# Patient Record
Sex: Female | Born: 1970 | Race: Black or African American | Hispanic: No | Smoking: Current every day smoker
Health system: Southern US, Community
[De-identification: ages and names within clinical notes are randomized; demographics above are authoritative.]

## PROBLEM LIST (undated history)

## (undated) DIAGNOSIS — G894 Chronic pain syndrome: Secondary | ICD-10-CM

## (undated) DIAGNOSIS — I890 Lymphedema, not elsewhere classified: Secondary | ICD-10-CM

## (undated) DIAGNOSIS — R159 Full incontinence of feces: Secondary | ICD-10-CM

## (undated) DIAGNOSIS — K219 Gastro-esophageal reflux disease without esophagitis: Secondary | ICD-10-CM

## (undated) DIAGNOSIS — E782 Mixed hyperlipidemia: Secondary | ICD-10-CM

## (undated) DIAGNOSIS — E119 Type 2 diabetes mellitus without complications: Secondary | ICD-10-CM

## (undated) DIAGNOSIS — N3281 Overactive bladder: Secondary | ICD-10-CM

## (undated) DIAGNOSIS — F32A Depression, unspecified: Secondary | ICD-10-CM

## (undated) DIAGNOSIS — G4730 Sleep apnea, unspecified: Secondary | ICD-10-CM

## (undated) DIAGNOSIS — J45909 Unspecified asthma, uncomplicated: Secondary | ICD-10-CM

## (undated) DIAGNOSIS — F411 Generalized anxiety disorder: Secondary | ICD-10-CM

## (undated) DIAGNOSIS — R0683 Snoring: Secondary | ICD-10-CM

## (undated) DIAGNOSIS — J40 Bronchitis, not specified as acute or chronic: Secondary | ICD-10-CM

## (undated) DIAGNOSIS — R0600 Dyspnea, unspecified: Secondary | ICD-10-CM

## (undated) DIAGNOSIS — Z973 Presence of spectacles and contact lenses: Secondary | ICD-10-CM

## (undated) DIAGNOSIS — F419 Anxiety disorder, unspecified: Secondary | ICD-10-CM

## (undated) DIAGNOSIS — Z8709 Personal history of other diseases of the respiratory system: Secondary | ICD-10-CM

## (undated) DIAGNOSIS — R152 Fecal urgency: Secondary | ICD-10-CM

## (undated) DIAGNOSIS — I209 Angina pectoris, unspecified: Secondary | ICD-10-CM

## (undated) DIAGNOSIS — I509 Heart failure, unspecified: Secondary | ICD-10-CM

## (undated) DIAGNOSIS — M5430 Sciatica, unspecified side: Secondary | ICD-10-CM

## (undated) DIAGNOSIS — M1990 Unspecified osteoarthritis, unspecified site: Secondary | ICD-10-CM

## (undated) DIAGNOSIS — Z86718 Personal history of other venous thrombosis and embolism: Secondary | ICD-10-CM

## (undated) DIAGNOSIS — K760 Fatty (change of) liver, not elsewhere classified: Secondary | ICD-10-CM

## (undated) DIAGNOSIS — J449 Chronic obstructive pulmonary disease, unspecified: Secondary | ICD-10-CM

## (undated) DIAGNOSIS — F209 Schizophrenia, unspecified: Secondary | ICD-10-CM

## (undated) DIAGNOSIS — G629 Polyneuropathy, unspecified: Secondary | ICD-10-CM

## (undated) DIAGNOSIS — F319 Bipolar disorder, unspecified: Secondary | ICD-10-CM

## (undated) DIAGNOSIS — R0602 Shortness of breath: Secondary | ICD-10-CM

## (undated) DIAGNOSIS — J189 Pneumonia, unspecified organism: Secondary | ICD-10-CM

## (undated) DIAGNOSIS — F329 Major depressive disorder, single episode, unspecified: Secondary | ICD-10-CM

## (undated) DIAGNOSIS — N644 Mastodynia: Secondary | ICD-10-CM

## (undated) DIAGNOSIS — K469 Unspecified abdominal hernia without obstruction or gangrene: Secondary | ICD-10-CM

## (undated) DIAGNOSIS — I10 Essential (primary) hypertension: Secondary | ICD-10-CM

## (undated) DIAGNOSIS — Z86711 Personal history of pulmonary embolism: Secondary | ICD-10-CM

## (undated) DIAGNOSIS — G4733 Obstructive sleep apnea (adult) (pediatric): Secondary | ICD-10-CM

## (undated) DIAGNOSIS — R011 Cardiac murmur, unspecified: Secondary | ICD-10-CM

## (undated) DIAGNOSIS — R519 Headache, unspecified: Secondary | ICD-10-CM

## (undated) DIAGNOSIS — F1911 Other psychoactive substance abuse, in remission: Secondary | ICD-10-CM

## (undated) HISTORY — PX: OTHER SURGICAL HISTORY: SHX169

## (undated) HISTORY — PX: ORIF ORBITAL FRACTURE: SHX5312

## (undated) HISTORY — PX: CHOLECYSTECTOMY: SHX55

## (undated) HISTORY — PX: ROTATOR CUFF REPAIR: SHX139

## (undated) HISTORY — PX: BACK SURGERY: SHX140

## (undated) HISTORY — PX: TUBAL LIGATION: SHX77

## (undated) HISTORY — PX: EYE SURGERY: SHX253

## (undated) HISTORY — PX: KNEE ARTHROSCOPY: SUR90

## (undated) HISTORY — DX: Lymphedema, not elsewhere classified: I89.0

---
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
## 1997-09-14 HISTORY — PX: TUBAL LIGATION: SHX77

## 1997-12-23 ENCOUNTER — Inpatient Hospital Stay (HOSPITAL_COMMUNITY): Admission: AD | Admit: 1997-12-23 | Discharge: 1997-12-23 | Payer: Self-pay | Admitting: Obstetrics and Gynecology

## 1998-01-20 ENCOUNTER — Inpatient Hospital Stay (HOSPITAL_COMMUNITY): Admission: AD | Admit: 1998-01-20 | Discharge: 1998-01-20 | Payer: Self-pay | Admitting: Obstetrics and Gynecology

## 1998-02-28 ENCOUNTER — Inpatient Hospital Stay (HOSPITAL_COMMUNITY): Admission: AD | Admit: 1998-02-28 | Discharge: 1998-02-28 | Payer: Self-pay | Admitting: Obstetrics & Gynecology

## 1998-03-09 ENCOUNTER — Inpatient Hospital Stay (HOSPITAL_COMMUNITY): Admission: AD | Admit: 1998-03-09 | Discharge: 1998-03-09 | Payer: Self-pay | Admitting: Obstetrics & Gynecology

## 1998-04-14 ENCOUNTER — Inpatient Hospital Stay (HOSPITAL_COMMUNITY): Admission: AD | Admit: 1998-04-14 | Discharge: 1998-04-14 | Payer: Self-pay | Admitting: Obstetrics & Gynecology

## 1998-04-25 ENCOUNTER — Encounter: Admission: RE | Admit: 1998-04-25 | Discharge: 1998-04-25 | Payer: Self-pay | Admitting: Family Medicine

## 1998-05-01 ENCOUNTER — Encounter: Admission: RE | Admit: 1998-05-01 | Discharge: 1998-05-01 | Payer: Self-pay | Admitting: Family Medicine

## 1998-05-14 ENCOUNTER — Emergency Department (HOSPITAL_COMMUNITY): Admission: EM | Admit: 1998-05-14 | Discharge: 1998-05-14 | Payer: Self-pay | Admitting: Emergency Medicine

## 1998-06-05 ENCOUNTER — Inpatient Hospital Stay (HOSPITAL_COMMUNITY): Admission: AD | Admit: 1998-06-05 | Discharge: 1998-06-05 | Payer: Self-pay | Admitting: Obstetrics & Gynecology

## 1998-06-10 ENCOUNTER — Inpatient Hospital Stay (HOSPITAL_COMMUNITY): Admission: AD | Admit: 1998-06-10 | Discharge: 1998-06-12 | Payer: Self-pay

## 1998-09-11 ENCOUNTER — Encounter: Admission: RE | Admit: 1998-09-11 | Discharge: 1998-09-11 | Payer: Self-pay | Admitting: Sports Medicine

## 1998-10-15 ENCOUNTER — Encounter: Admission: RE | Admit: 1998-10-15 | Discharge: 1998-10-15 | Payer: Self-pay | Admitting: Family Medicine

## 1998-11-22 ENCOUNTER — Encounter: Admission: RE | Admit: 1998-11-22 | Discharge: 1998-11-22 | Payer: Self-pay | Admitting: Family Medicine

## 1998-12-11 ENCOUNTER — Encounter: Admission: RE | Admit: 1998-12-11 | Discharge: 1998-12-11 | Payer: Self-pay | Admitting: Family Medicine

## 1998-12-11 ENCOUNTER — Other Ambulatory Visit: Admission: RE | Admit: 1998-12-11 | Discharge: 1998-12-11 | Payer: Self-pay

## 1999-01-01 ENCOUNTER — Encounter: Admission: RE | Admit: 1999-01-01 | Discharge: 1999-01-01 | Payer: Self-pay | Admitting: Family Medicine

## 1999-01-15 ENCOUNTER — Encounter: Admission: RE | Admit: 1999-01-15 | Discharge: 1999-01-15 | Payer: Self-pay | Admitting: Family Medicine

## 1999-02-11 ENCOUNTER — Encounter: Admission: RE | Admit: 1999-02-11 | Discharge: 1999-02-11 | Payer: Self-pay | Admitting: Family Medicine

## 1999-03-04 ENCOUNTER — Encounter: Admission: RE | Admit: 1999-03-04 | Discharge: 1999-03-04 | Payer: Self-pay | Admitting: Family Medicine

## 1999-03-20 ENCOUNTER — Encounter: Admission: RE | Admit: 1999-03-20 | Discharge: 1999-03-20 | Payer: Self-pay | Admitting: Family Medicine

## 1999-05-09 ENCOUNTER — Encounter: Admission: RE | Admit: 1999-05-09 | Discharge: 1999-05-09 | Payer: Self-pay | Admitting: Family Medicine

## 1999-07-02 ENCOUNTER — Emergency Department (HOSPITAL_COMMUNITY): Admission: EM | Admit: 1999-07-02 | Discharge: 1999-07-02 | Payer: Self-pay | Admitting: Emergency Medicine

## 1999-07-10 ENCOUNTER — Encounter: Admission: RE | Admit: 1999-07-10 | Discharge: 1999-07-10 | Payer: Self-pay | Admitting: Family Medicine

## 1999-07-10 ENCOUNTER — Emergency Department (HOSPITAL_COMMUNITY): Admission: EM | Admit: 1999-07-10 | Discharge: 1999-07-10 | Payer: Self-pay | Admitting: Emergency Medicine

## 1999-08-28 ENCOUNTER — Encounter: Admission: RE | Admit: 1999-08-28 | Discharge: 1999-08-28 | Payer: Self-pay | Admitting: Family Medicine

## 1999-09-17 ENCOUNTER — Encounter: Admission: RE | Admit: 1999-09-17 | Discharge: 1999-09-17 | Payer: Self-pay | Admitting: Family Medicine

## 1999-10-01 ENCOUNTER — Encounter: Admission: RE | Admit: 1999-10-01 | Discharge: 1999-10-01 | Payer: Self-pay | Admitting: Family Medicine

## 1999-10-07 ENCOUNTER — Encounter: Admission: RE | Admit: 1999-10-07 | Discharge: 1999-10-07 | Payer: Self-pay | Admitting: Family Medicine

## 1999-10-28 ENCOUNTER — Encounter: Admission: RE | Admit: 1999-10-28 | Discharge: 1999-10-28 | Payer: Self-pay | Admitting: Sports Medicine

## 2000-01-27 ENCOUNTER — Other Ambulatory Visit: Admission: RE | Admit: 2000-01-27 | Discharge: 2000-01-27 | Payer: Self-pay | Admitting: Obstetrics & Gynecology

## 2000-01-27 ENCOUNTER — Encounter: Admission: RE | Admit: 2000-01-27 | Discharge: 2000-01-27 | Payer: Self-pay | Admitting: Family Medicine

## 2000-02-12 ENCOUNTER — Emergency Department (HOSPITAL_COMMUNITY): Admission: EM | Admit: 2000-02-12 | Discharge: 2000-02-12 | Payer: Self-pay | Admitting: Emergency Medicine

## 2000-02-12 ENCOUNTER — Encounter: Payer: Self-pay | Admitting: Emergency Medicine

## 2000-06-15 ENCOUNTER — Encounter: Admission: RE | Admit: 2000-06-15 | Discharge: 2000-06-15 | Payer: Self-pay | Admitting: Family Medicine

## 2000-07-04 ENCOUNTER — Inpatient Hospital Stay (HOSPITAL_COMMUNITY): Admission: EM | Admit: 2000-07-04 | Discharge: 2000-07-05 | Payer: Self-pay | Admitting: Emergency Medicine

## 2000-07-04 ENCOUNTER — Encounter: Payer: Self-pay | Admitting: Emergency Medicine

## 2000-08-22 ENCOUNTER — Emergency Department (HOSPITAL_COMMUNITY): Admission: EM | Admit: 2000-08-22 | Discharge: 2000-08-22 | Payer: Self-pay | Admitting: Emergency Medicine

## 2000-09-06 ENCOUNTER — Emergency Department (HOSPITAL_COMMUNITY): Admission: EM | Admit: 2000-09-06 | Discharge: 2000-09-06 | Payer: Self-pay | Admitting: Emergency Medicine

## 2000-09-20 ENCOUNTER — Encounter: Admission: RE | Admit: 2000-09-20 | Discharge: 2000-09-20 | Payer: Self-pay | Admitting: Family Medicine

## 2000-09-20 ENCOUNTER — Encounter: Admission: RE | Admit: 2000-09-20 | Discharge: 2000-09-20 | Payer: Self-pay | Admitting: *Deleted

## 2000-09-20 ENCOUNTER — Encounter: Payer: Self-pay | Admitting: *Deleted

## 2000-10-04 ENCOUNTER — Encounter: Admission: RE | Admit: 2000-10-04 | Discharge: 2000-10-04 | Payer: Self-pay | Admitting: Family Medicine

## 2000-10-28 ENCOUNTER — Encounter: Admission: RE | Admit: 2000-10-28 | Discharge: 2000-10-28 | Payer: Self-pay | Admitting: Family Medicine

## 2000-11-21 ENCOUNTER — Encounter: Payer: Self-pay | Admitting: Emergency Medicine

## 2000-11-21 ENCOUNTER — Emergency Department (HOSPITAL_COMMUNITY): Admission: EM | Admit: 2000-11-21 | Discharge: 2000-11-21 | Payer: Self-pay | Admitting: Emergency Medicine

## 2001-01-28 ENCOUNTER — Encounter: Admission: RE | Admit: 2001-01-28 | Discharge: 2001-01-28 | Payer: Self-pay | Admitting: Family Medicine

## 2001-05-20 ENCOUNTER — Encounter: Admission: RE | Admit: 2001-05-20 | Discharge: 2001-05-20 | Payer: Self-pay | Admitting: Family Medicine

## 2001-06-03 ENCOUNTER — Encounter: Admission: RE | Admit: 2001-06-03 | Discharge: 2001-06-03 | Payer: Self-pay | Admitting: Family Medicine

## 2001-07-20 ENCOUNTER — Other Ambulatory Visit: Admission: RE | Admit: 2001-07-20 | Discharge: 2001-07-20 | Payer: Self-pay | Admitting: Family Medicine

## 2001-07-20 ENCOUNTER — Encounter: Admission: RE | Admit: 2001-07-20 | Discharge: 2001-07-20 | Payer: Self-pay | Admitting: Family Medicine

## 2001-09-03 ENCOUNTER — Emergency Department (HOSPITAL_COMMUNITY): Admission: EM | Admit: 2001-09-03 | Discharge: 2001-09-03 | Payer: Self-pay | Admitting: Emergency Medicine

## 2001-09-16 ENCOUNTER — Ambulatory Visit (HOSPITAL_COMMUNITY): Admission: RE | Admit: 2001-09-16 | Discharge: 2001-09-16 | Payer: Self-pay | Admitting: Family Medicine

## 2001-09-16 ENCOUNTER — Encounter: Admission: RE | Admit: 2001-09-16 | Discharge: 2001-09-16 | Payer: Self-pay | Admitting: Family Medicine

## 2001-10-17 ENCOUNTER — Encounter: Admission: RE | Admit: 2001-10-17 | Discharge: 2001-10-17 | Payer: Self-pay | Admitting: Family Medicine

## 2002-01-03 ENCOUNTER — Emergency Department (HOSPITAL_COMMUNITY): Admission: EM | Admit: 2002-01-03 | Discharge: 2002-01-03 | Payer: Self-pay | Admitting: Emergency Medicine

## 2002-02-24 ENCOUNTER — Encounter: Admission: RE | Admit: 2002-02-24 | Discharge: 2002-02-24 | Payer: Self-pay | Admitting: Family Medicine

## 2002-02-27 ENCOUNTER — Encounter: Admission: RE | Admit: 2002-02-27 | Discharge: 2002-02-27 | Payer: Self-pay | Admitting: Family Medicine

## 2002-04-12 ENCOUNTER — Encounter: Admission: RE | Admit: 2002-04-12 | Discharge: 2002-04-12 | Payer: Self-pay | Admitting: Family Medicine

## 2002-04-29 ENCOUNTER — Inpatient Hospital Stay (HOSPITAL_COMMUNITY): Admission: EM | Admit: 2002-04-29 | Discharge: 2002-05-01 | Payer: Self-pay | Admitting: Psychiatry

## 2002-05-26 ENCOUNTER — Encounter: Admission: RE | Admit: 2002-05-26 | Discharge: 2002-05-26 | Payer: Self-pay | Admitting: Family Medicine

## 2002-06-22 ENCOUNTER — Emergency Department (HOSPITAL_COMMUNITY): Admission: EM | Admit: 2002-06-22 | Discharge: 2002-06-22 | Payer: Self-pay | Admitting: Emergency Medicine

## 2002-09-18 ENCOUNTER — Emergency Department (HOSPITAL_COMMUNITY): Admission: EM | Admit: 2002-09-18 | Discharge: 2002-09-18 | Payer: Self-pay | Admitting: Emergency Medicine

## 2002-09-18 ENCOUNTER — Encounter: Payer: Self-pay | Admitting: Emergency Medicine

## 2002-09-21 ENCOUNTER — Encounter: Admission: RE | Admit: 2002-09-21 | Discharge: 2002-09-21 | Payer: Self-pay | Admitting: Sports Medicine

## 2002-09-21 ENCOUNTER — Encounter: Payer: Self-pay | Admitting: Sports Medicine

## 2002-10-03 ENCOUNTER — Encounter: Admission: RE | Admit: 2002-10-03 | Discharge: 2002-10-03 | Payer: Self-pay | Admitting: Sports Medicine

## 2002-10-31 ENCOUNTER — Other Ambulatory Visit: Admission: RE | Admit: 2002-10-31 | Discharge: 2002-10-31 | Payer: Self-pay | Admitting: Family Medicine

## 2002-10-31 ENCOUNTER — Encounter: Admission: RE | Admit: 2002-10-31 | Discharge: 2002-10-31 | Payer: Self-pay | Admitting: Sports Medicine

## 2002-11-26 ENCOUNTER — Encounter: Payer: Self-pay | Admitting: Emergency Medicine

## 2002-11-26 ENCOUNTER — Emergency Department (HOSPITAL_COMMUNITY): Admission: EM | Admit: 2002-11-26 | Discharge: 2002-11-26 | Payer: Self-pay | Admitting: Emergency Medicine

## 2002-12-07 ENCOUNTER — Encounter: Admission: RE | Admit: 2002-12-07 | Discharge: 2002-12-07 | Payer: Self-pay | Admitting: Family Medicine

## 2003-01-10 ENCOUNTER — Encounter: Admission: RE | Admit: 2003-01-10 | Discharge: 2003-01-10 | Payer: Self-pay | Admitting: Family Medicine

## 2003-01-29 ENCOUNTER — Emergency Department (HOSPITAL_COMMUNITY): Admission: EM | Admit: 2003-01-29 | Discharge: 2003-01-29 | Payer: Self-pay | Admitting: Emergency Medicine

## 2003-02-15 ENCOUNTER — Encounter: Admission: RE | Admit: 2003-02-15 | Discharge: 2003-02-15 | Payer: Self-pay | Admitting: Sports Medicine

## 2003-03-13 ENCOUNTER — Inpatient Hospital Stay (HOSPITAL_COMMUNITY): Admission: AD | Admit: 2003-03-13 | Discharge: 2003-03-13 | Payer: Self-pay | Admitting: *Deleted

## 2003-03-13 ENCOUNTER — Encounter: Payer: Self-pay | Admitting: *Deleted

## 2003-03-14 ENCOUNTER — Encounter: Admission: RE | Admit: 2003-03-14 | Discharge: 2003-03-14 | Payer: Self-pay | Admitting: Family Medicine

## 2003-04-11 ENCOUNTER — Encounter: Admission: RE | Admit: 2003-04-11 | Discharge: 2003-04-11 | Payer: Self-pay | Admitting: Family Medicine

## 2003-05-27 ENCOUNTER — Encounter: Payer: Self-pay | Admitting: Emergency Medicine

## 2003-05-27 ENCOUNTER — Emergency Department (HOSPITAL_COMMUNITY): Admission: EM | Admit: 2003-05-27 | Discharge: 2003-05-27 | Payer: Self-pay | Admitting: Emergency Medicine

## 2003-05-28 ENCOUNTER — Encounter: Admission: RE | Admit: 2003-05-28 | Discharge: 2003-05-28 | Payer: Self-pay | Admitting: Family Medicine

## 2003-06-17 ENCOUNTER — Encounter: Payer: Self-pay | Admitting: Orthopedic Surgery

## 2003-06-17 ENCOUNTER — Ambulatory Visit (HOSPITAL_COMMUNITY): Admission: RE | Admit: 2003-06-17 | Discharge: 2003-06-17 | Payer: Self-pay | Admitting: Orthopedic Surgery

## 2003-06-25 ENCOUNTER — Encounter: Admission: RE | Admit: 2003-06-25 | Discharge: 2003-07-02 | Payer: Self-pay | Admitting: Orthopedic Surgery

## 2003-11-03 ENCOUNTER — Emergency Department (HOSPITAL_COMMUNITY): Admission: EM | Admit: 2003-11-03 | Discharge: 2003-11-03 | Payer: Self-pay | Admitting: Emergency Medicine

## 2003-11-20 ENCOUNTER — Encounter: Admission: RE | Admit: 2003-11-20 | Discharge: 2003-11-20 | Payer: Self-pay | Admitting: Family Medicine

## 2004-01-21 ENCOUNTER — Encounter: Admission: RE | Admit: 2004-01-21 | Discharge: 2004-01-21 | Payer: Self-pay | Admitting: Family Medicine

## 2004-01-21 ENCOUNTER — Other Ambulatory Visit: Admission: RE | Admit: 2004-01-21 | Discharge: 2004-01-21 | Payer: Self-pay | Admitting: Family Medicine

## 2004-01-28 ENCOUNTER — Encounter: Admission: RE | Admit: 2004-01-28 | Discharge: 2004-01-28 | Payer: Self-pay | Admitting: Sports Medicine

## 2004-01-28 ENCOUNTER — Encounter: Admission: RE | Admit: 2004-01-28 | Discharge: 2004-01-28 | Payer: Self-pay | Admitting: Family Medicine

## 2004-02-20 ENCOUNTER — Emergency Department (HOSPITAL_COMMUNITY): Admission: EM | Admit: 2004-02-20 | Discharge: 2004-02-20 | Payer: Self-pay | Admitting: Emergency Medicine

## 2004-06-12 ENCOUNTER — Emergency Department (HOSPITAL_COMMUNITY): Admission: EM | Admit: 2004-06-12 | Discharge: 2004-06-12 | Payer: Self-pay | Admitting: Emergency Medicine

## 2004-09-18 ENCOUNTER — Emergency Department (HOSPITAL_COMMUNITY): Admission: EM | Admit: 2004-09-18 | Discharge: 2004-09-18 | Payer: Self-pay | Admitting: Emergency Medicine

## 2004-12-15 ENCOUNTER — Emergency Department (HOSPITAL_COMMUNITY): Admission: EM | Admit: 2004-12-15 | Discharge: 2004-12-15 | Payer: Self-pay | Admitting: Emergency Medicine

## 2005-03-11 ENCOUNTER — Emergency Department (HOSPITAL_COMMUNITY): Admission: EM | Admit: 2005-03-11 | Discharge: 2005-03-11 | Payer: Self-pay | Admitting: Emergency Medicine

## 2005-03-17 ENCOUNTER — Emergency Department (HOSPITAL_COMMUNITY): Admission: EM | Admit: 2005-03-17 | Discharge: 2005-03-18 | Payer: Self-pay | Admitting: Emergency Medicine

## 2005-05-12 ENCOUNTER — Emergency Department (HOSPITAL_COMMUNITY): Admission: EM | Admit: 2005-05-12 | Discharge: 2005-05-12 | Payer: Self-pay | Admitting: Emergency Medicine

## 2005-08-04 ENCOUNTER — Emergency Department (HOSPITAL_COMMUNITY): Admission: EM | Admit: 2005-08-04 | Discharge: 2005-08-04 | Payer: Self-pay | Admitting: Emergency Medicine

## 2005-08-16 ENCOUNTER — Emergency Department (HOSPITAL_COMMUNITY): Admission: EM | Admit: 2005-08-16 | Discharge: 2005-08-16 | Payer: Self-pay | Admitting: Emergency Medicine

## 2005-08-27 ENCOUNTER — Emergency Department (HOSPITAL_COMMUNITY): Admission: EM | Admit: 2005-08-27 | Discharge: 2005-08-27 | Payer: Self-pay | Admitting: Emergency Medicine

## 2005-12-07 IMAGING — CR DG ABDOMEN ACUTE W/ 1V CHEST
3 series · 3 of 3 positions shown · non-contrast
Comparison: None.

CLINICAL DATA: Abdominal pain, nausea; cough, shortness of breath.
 ACUTE ABDOMEN SERIES (AP AND ERECT ABDOMEN AND PA CHEST) 11/03/03

[view not recorded (1 of 3)]
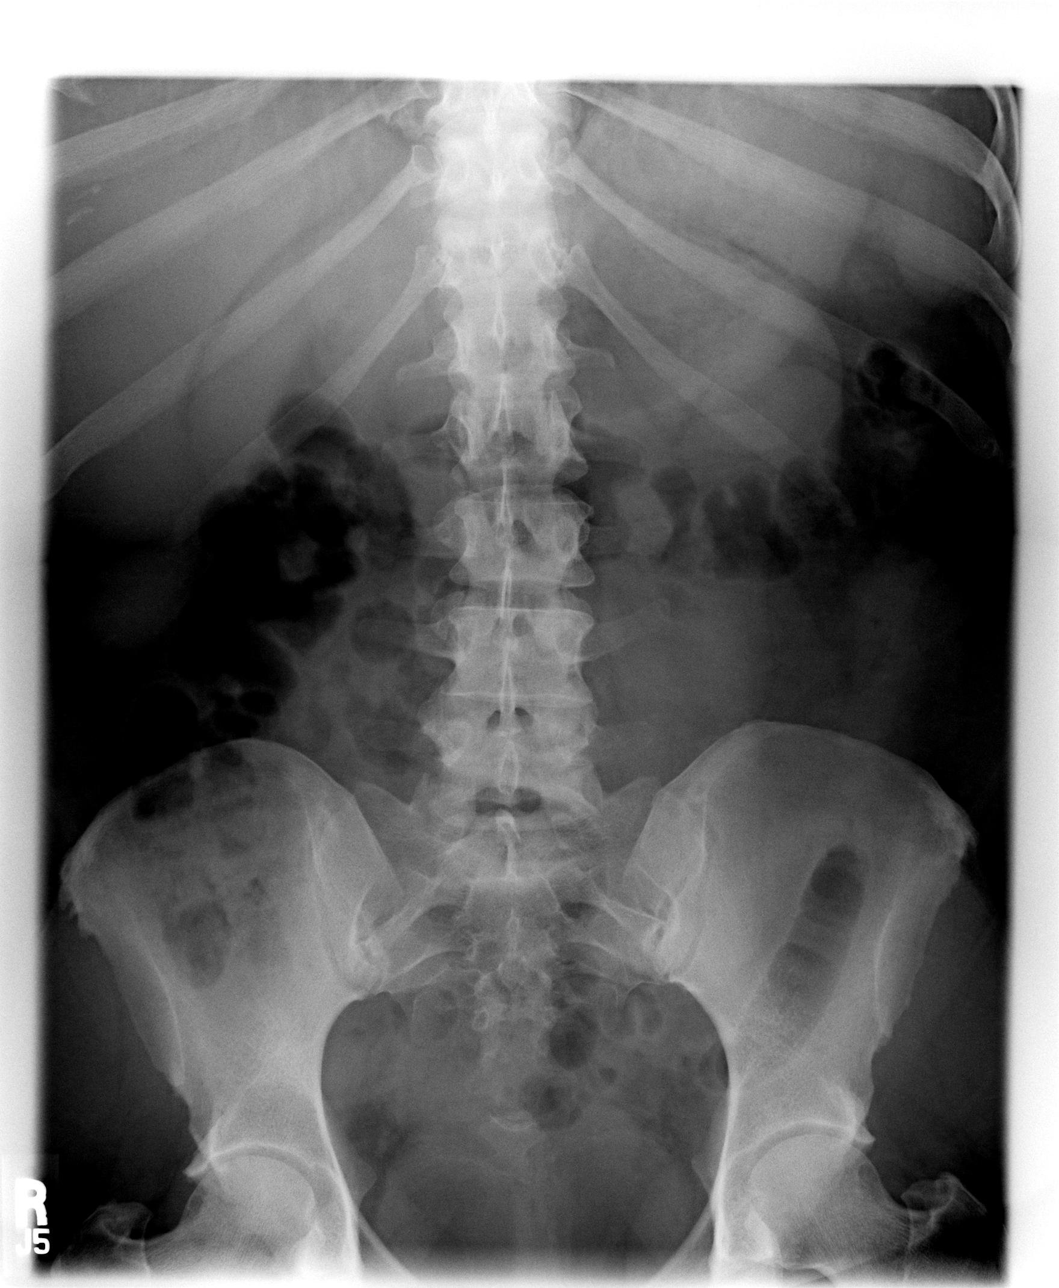

[view not recorded (2 of 3)]
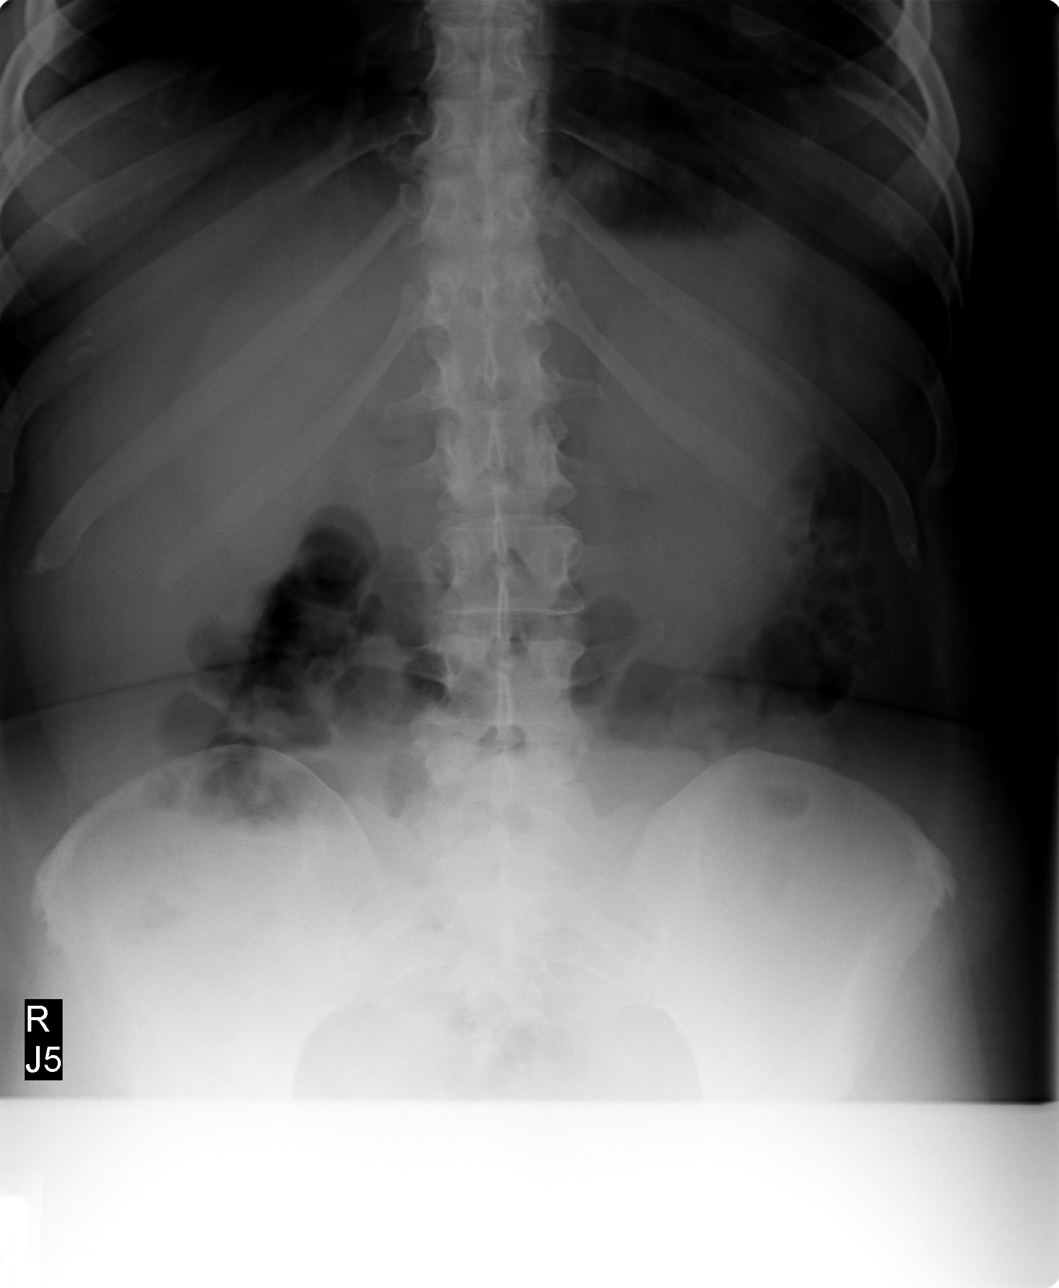

[view not recorded (3 of 3)]
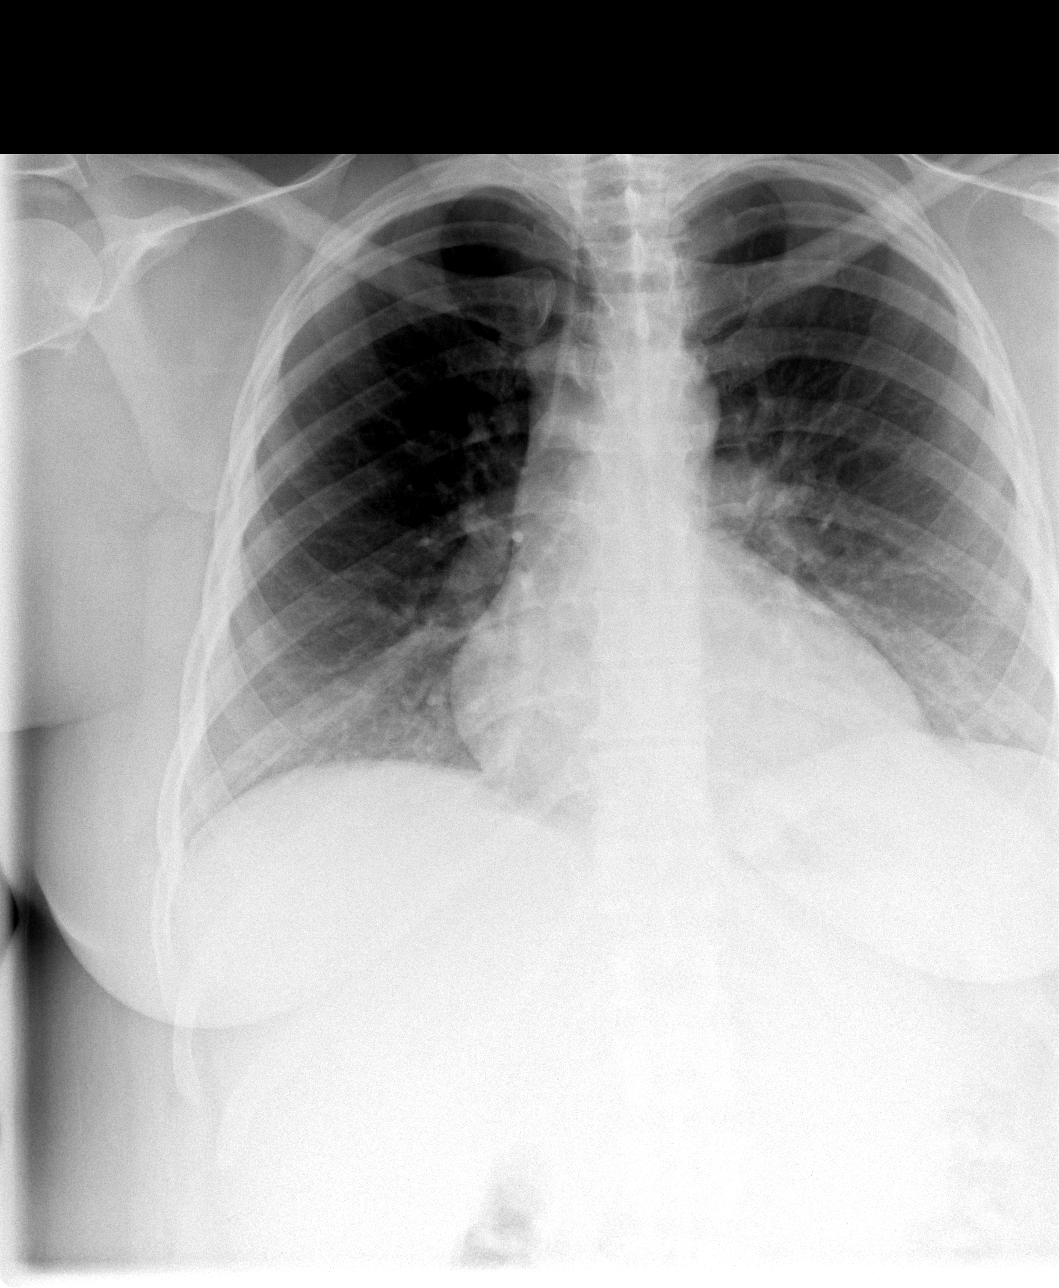

[3 of 3 positions shown; findings below may reference images not displayed]

The bowel gas pattern is unremarkable, and there is no evidence of obstruction or free intraperitoneal air.  No abnormal calcifications are identified.  "Tug" lesions are noted arising from the anterior superior iliac spines bilaterally.  The skeleton is otherwise unremarkable.  
 The accompany PA chest demonstrates a normal cardiomediastinal silhouette.  The lungs appear clear. 
 IMPRESSION
 No acute abdominal or pulmonary abnormalities.

## 2006-03-03 IMAGING — CR DG SHOULDER 2+V*L*
3 series · 3 of 3 positions shown · non-contrast
Comparison: none

CLINICAL DATA: Bilateral shoulder, low back, and bilateral sacroiliac joint pain.
SACROILIAC JOINTS ? 01/28/04 
Three views of the sacroiliac joints demonstrate normal appearing sacroiliac joints.  Hyperostosis arising from the lateral aspects of both iliac wings as well as minimal left and moderate right greater trochanteric spur formation or hyperostosis.
IMPRESSION
1.   Normal sacroiliac joints.
2.  Bilateral iliac wing and greater trochanter hyperostosis.  This can be seen with diffuse idiopathic skeletal hyperostosis (DISH). 
COMPLETE LUMBAR SPINE ? 01/28/04 
Five views demonstrate five non rib bearing lumbar vertebrae and minimal levoconvex thoracolumbar rotary scoliosis.  Mild anterior spur formation at the L2-3 through L4-5 levels.  Moderate posterior spur formation at the L5-S1 level.  Facet degenerative changes in the mid and lower lumbar spine.  No pars defects or subluxations. 
Degenerative changes, as described above.
COMPLETE LEFT SHOULDER ? 01/28/04 
Three views of the left shoulder demonstrate mild superior and inferior distal clavicular spur formation.  
Mild clavicular spur formation. 
COMPLETE RIGHT SHOULDER ? 01/28/04 
Three views of the right shoulder demonstrate a small oval calcific density projected at the inferior aspect of the glenohumeral joint on the internal rotation view.  This is projected over the humeral neck on the lateral rotation view.  Also noted is mild to moderate hyperostosis arising from the proximal aspect of the greater tuberosity.  
1.  Small loose body or accessory ossicle, as described above. 
2.  Mild to moderate greater tuberosity hyperostosis.

[view not recorded (1 of 3)]
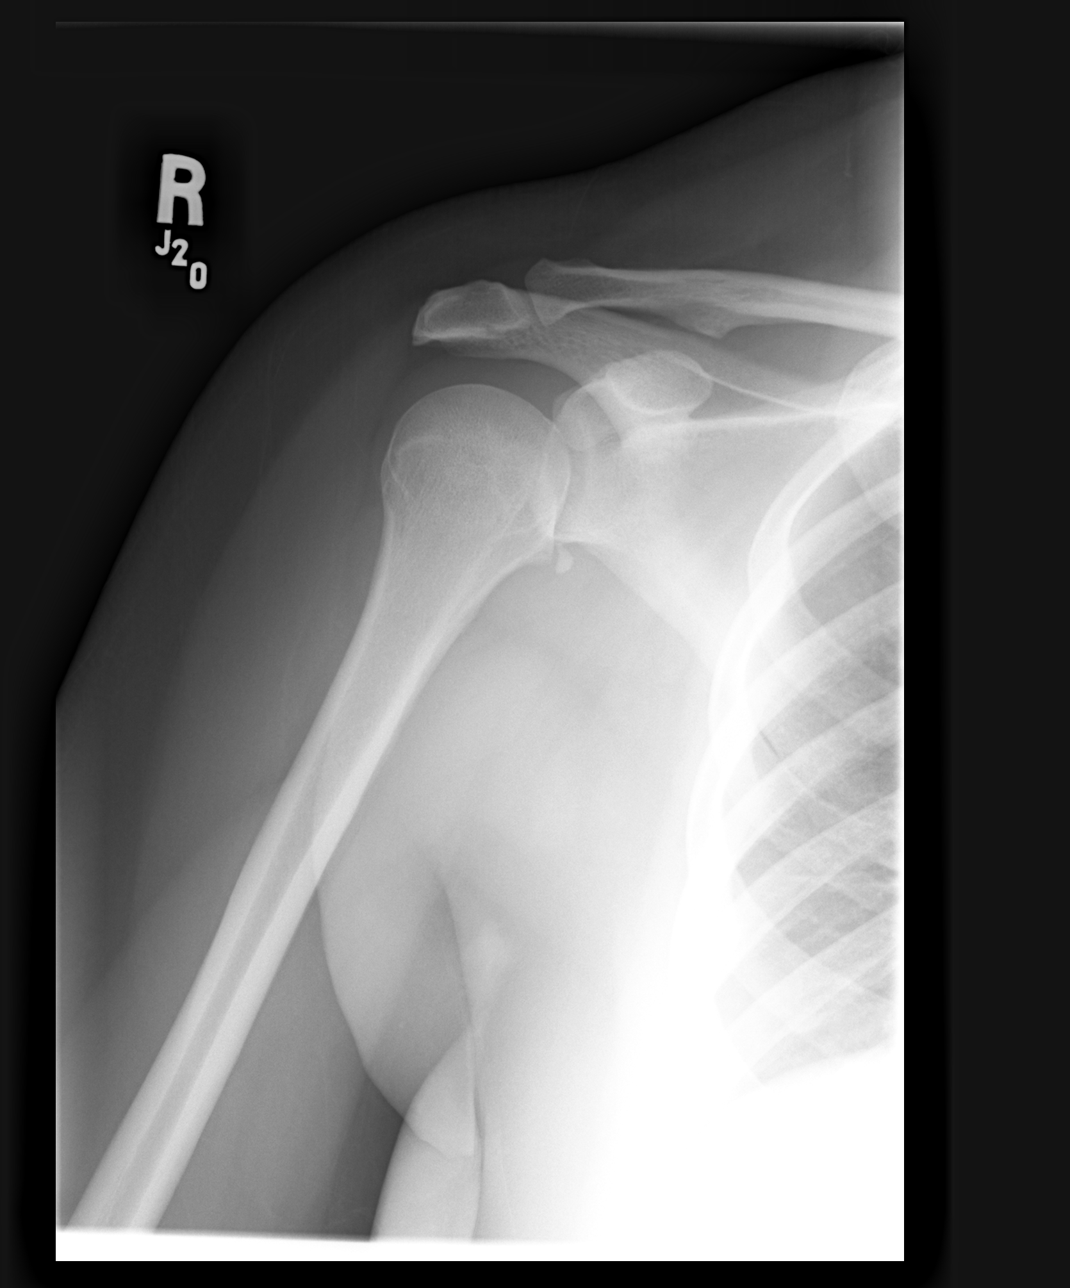

[view not recorded (2 of 3)]
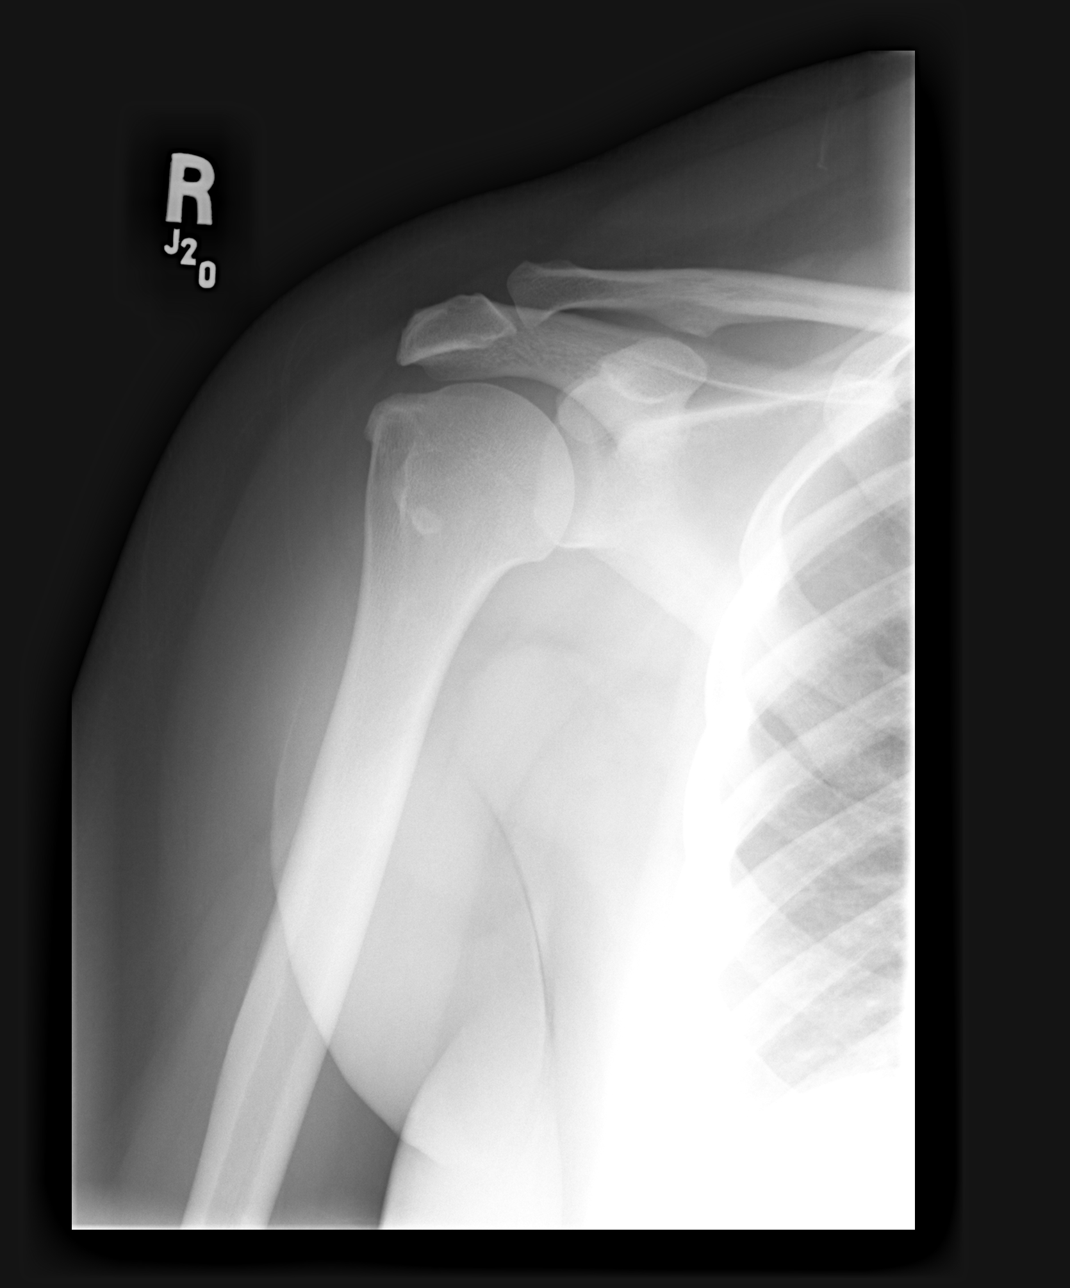

[view not recorded (3 of 3)]
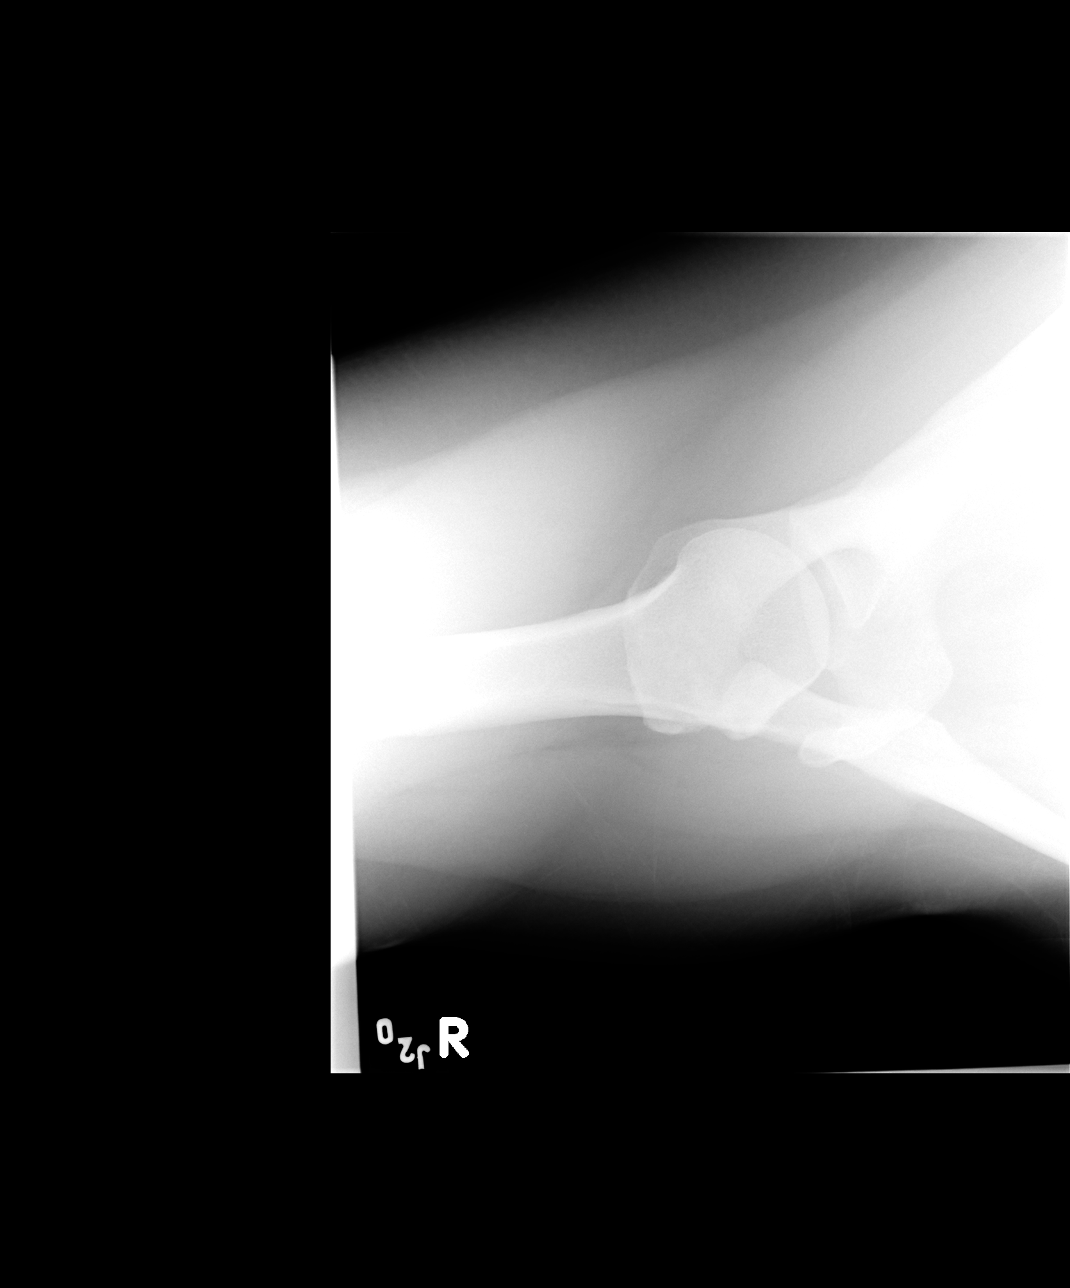

[3 of 3 positions shown; findings below may reference images not displayed]

## 2006-03-03 IMAGING — CR DG SI JOINTS 3+V
3 series · 3 of 3 positions shown · non-contrast
Comparison: none

CLINICAL DATA: Bilateral shoulder, low back, and bilateral sacroiliac joint pain.
SACROILIAC JOINTS ? 01/28/04 
Three views of the sacroiliac joints demonstrate normal appearing sacroiliac joints.  Hyperostosis arising from the lateral aspects of both iliac wings as well as minimal left and moderate right greater trochanteric spur formation or hyperostosis.
IMPRESSION
1.   Normal sacroiliac joints.
2.  Bilateral iliac wing and greater trochanter hyperostosis.  This can be seen with diffuse idiopathic skeletal hyperostosis (DISH). 
COMPLETE LUMBAR SPINE ? 01/28/04 
Five views demonstrate five non rib bearing lumbar vertebrae and minimal levoconvex thoracolumbar rotary scoliosis.  Mild anterior spur formation at the L2-3 through L4-5 levels.  Moderate posterior spur formation at the L5-S1 level.  Facet degenerative changes in the mid and lower lumbar spine.  No pars defects or subluxations. 
Degenerative changes, as described above.
COMPLETE LEFT SHOULDER ? 01/28/04 
Three views of the left shoulder demonstrate mild superior and inferior distal clavicular spur formation.  
Mild clavicular spur formation. 
COMPLETE RIGHT SHOULDER ? 01/28/04 
Three views of the right shoulder demonstrate a small oval calcific density projected at the inferior aspect of the glenohumeral joint on the internal rotation view.  This is projected over the humeral neck on the lateral rotation view.  Also noted is mild to moderate hyperostosis arising from the proximal aspect of the greater tuberosity.  
1.  Small loose body or accessory ossicle, as described above. 
2.  Mild to moderate greater tuberosity hyperostosis.

[view not recorded (1 of 3)]
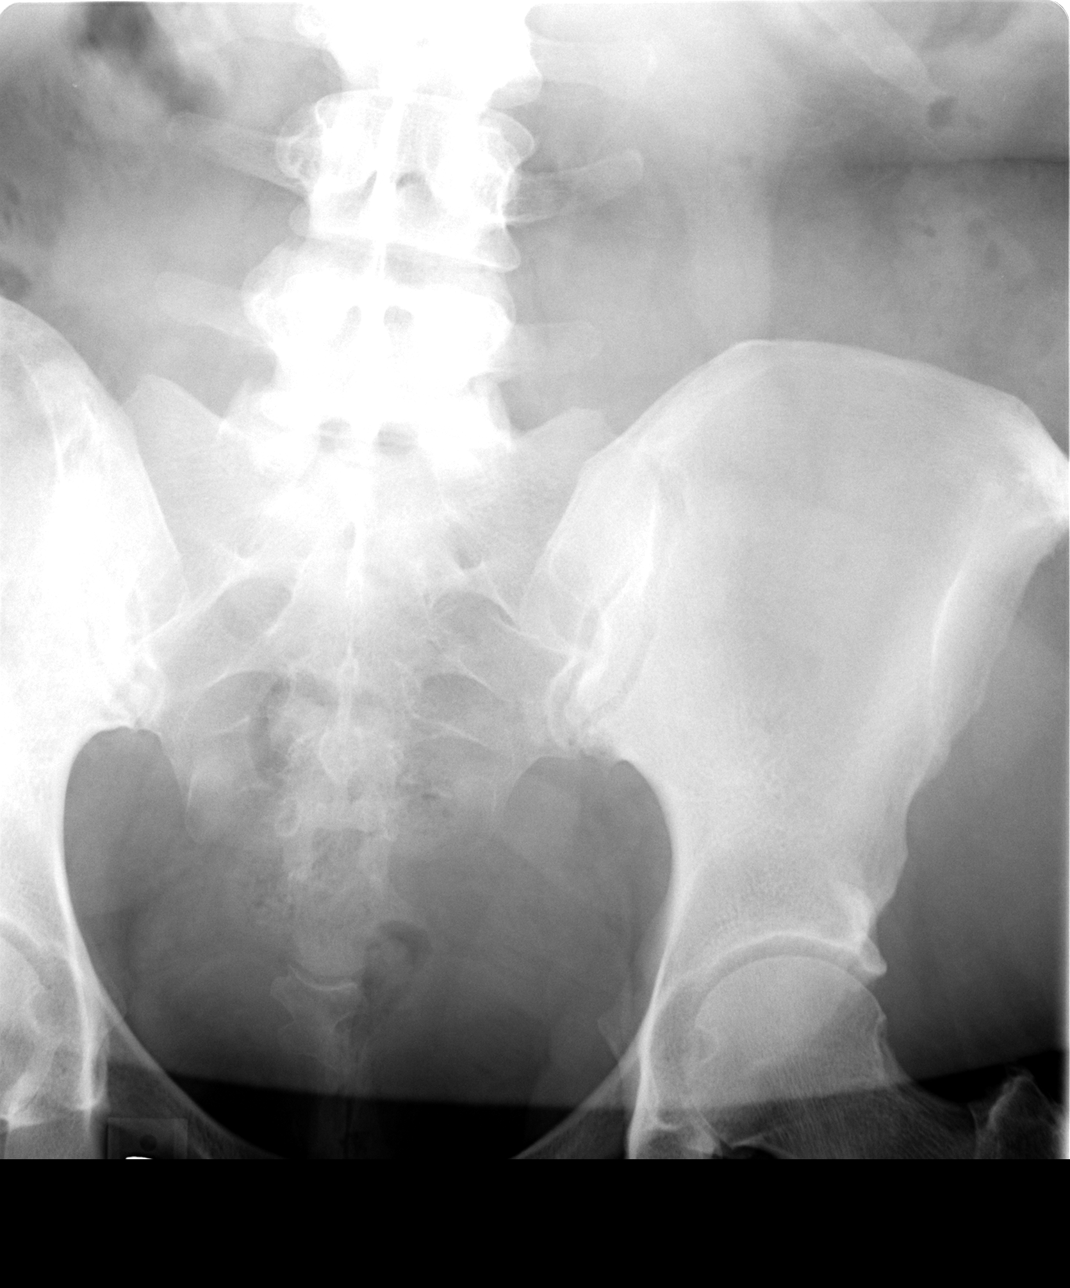

[view not recorded (2 of 3)]
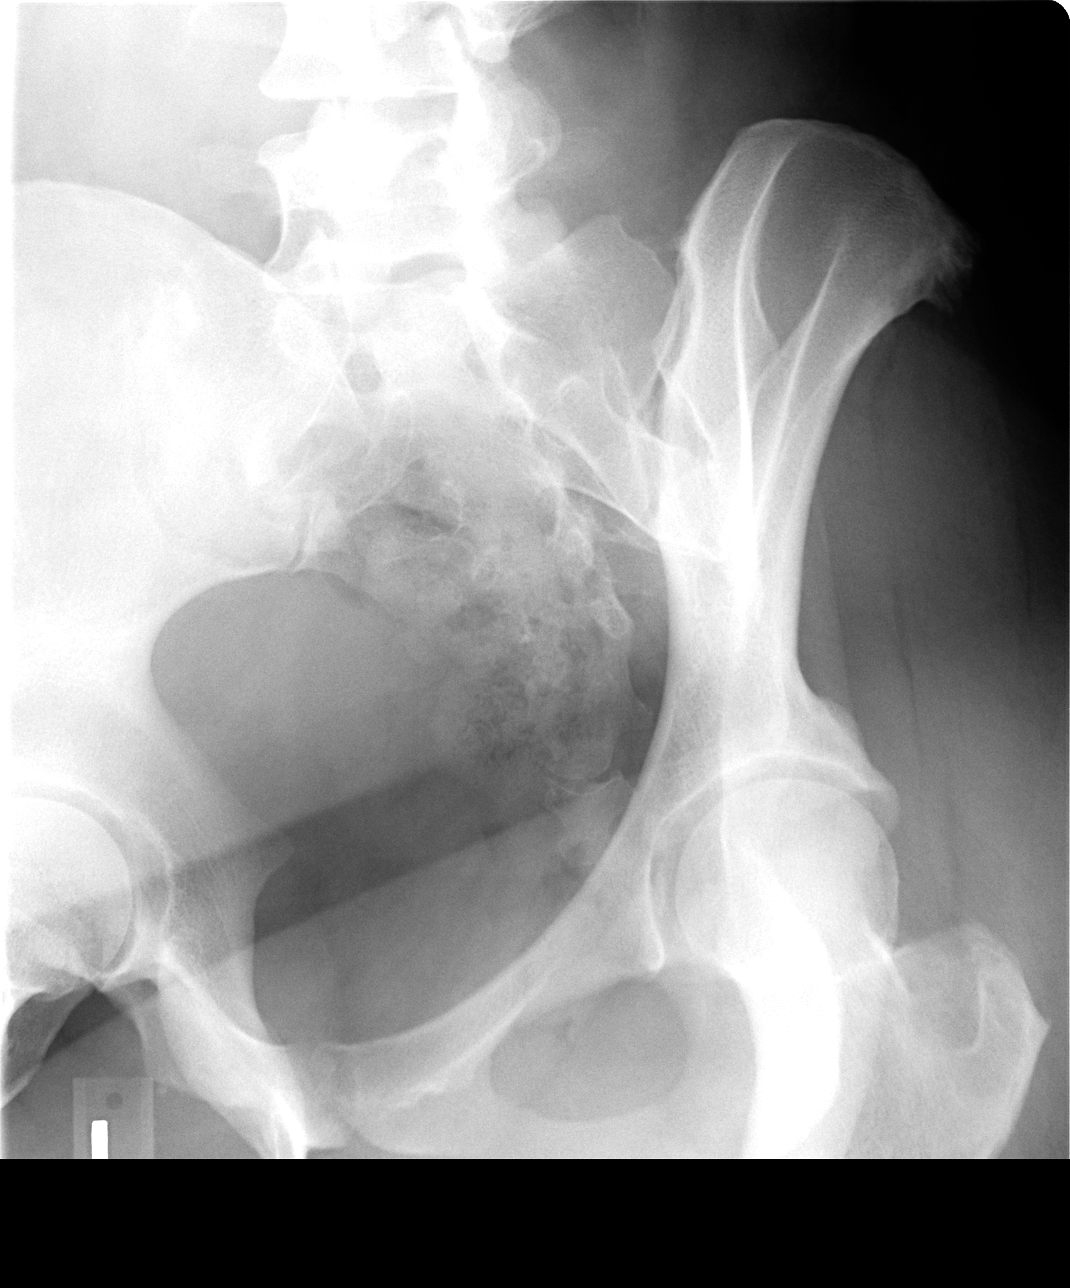

[view not recorded (3 of 3)]
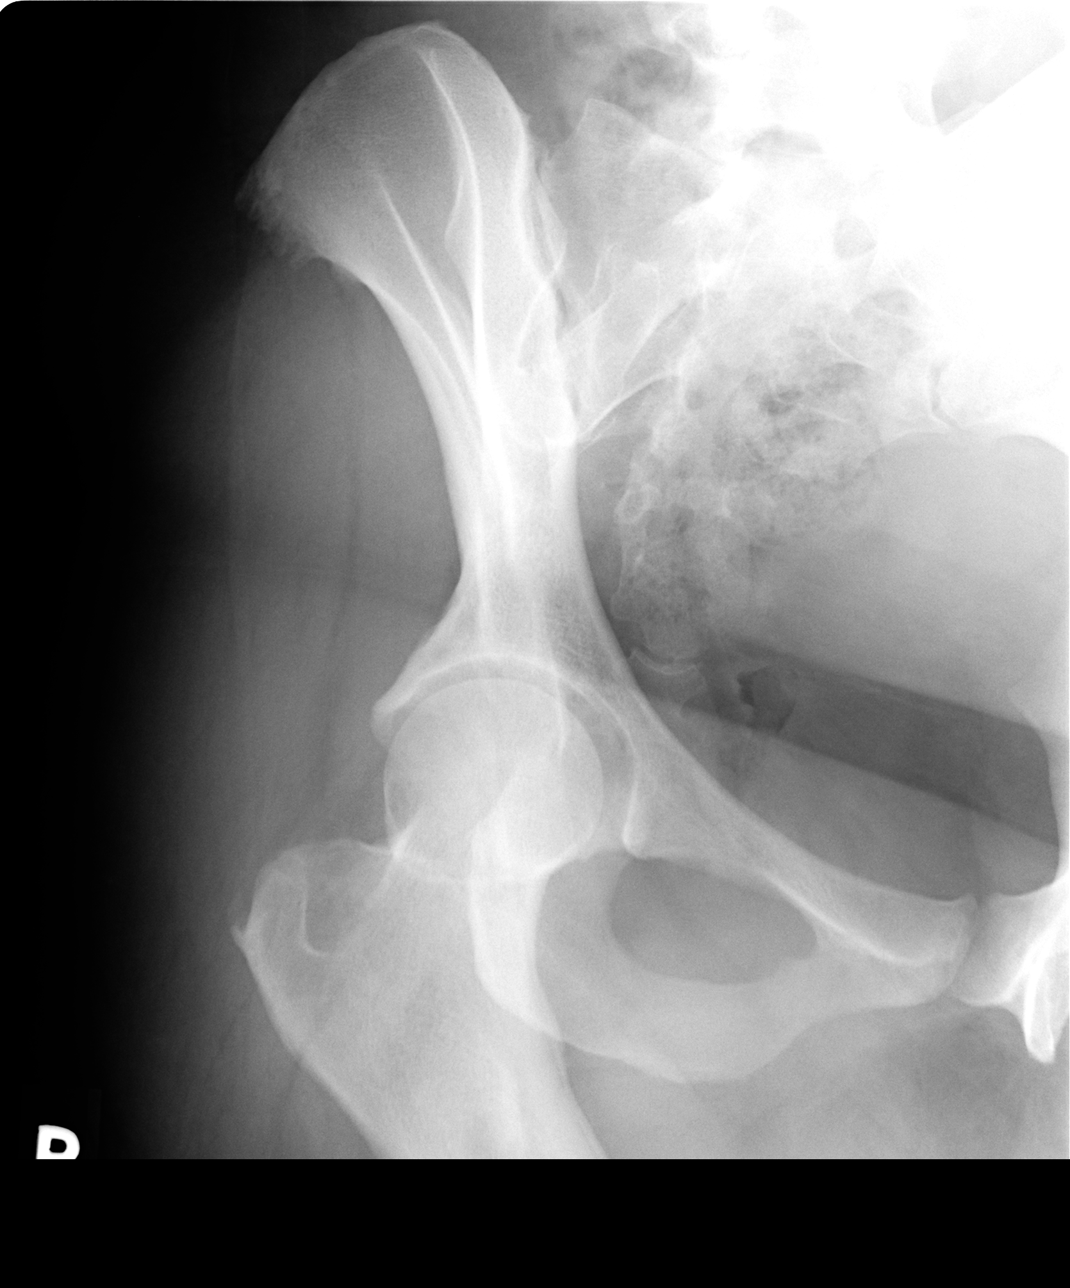

[3 of 3 positions shown; findings below may reference images not displayed]

## 2006-03-26 IMAGING — CR DG CHEST 2V
2 series · 2 of 2 positions shown · non-contrast
Comparison: none

CLINICAL DATA: Diffuse swelling.
 TWO VIEW CHEST   - 02/20/04 
 The frontal radiograph was obtained under low inspiration.  Negative for edema, infiltrates, or effusions.  The heart is normal.  The mediastinum and hila are negative for adenopathy.
 IMPRESSION
 Negative for acute cardiac or pulmonary process.

[view not recorded (1 of 2)]
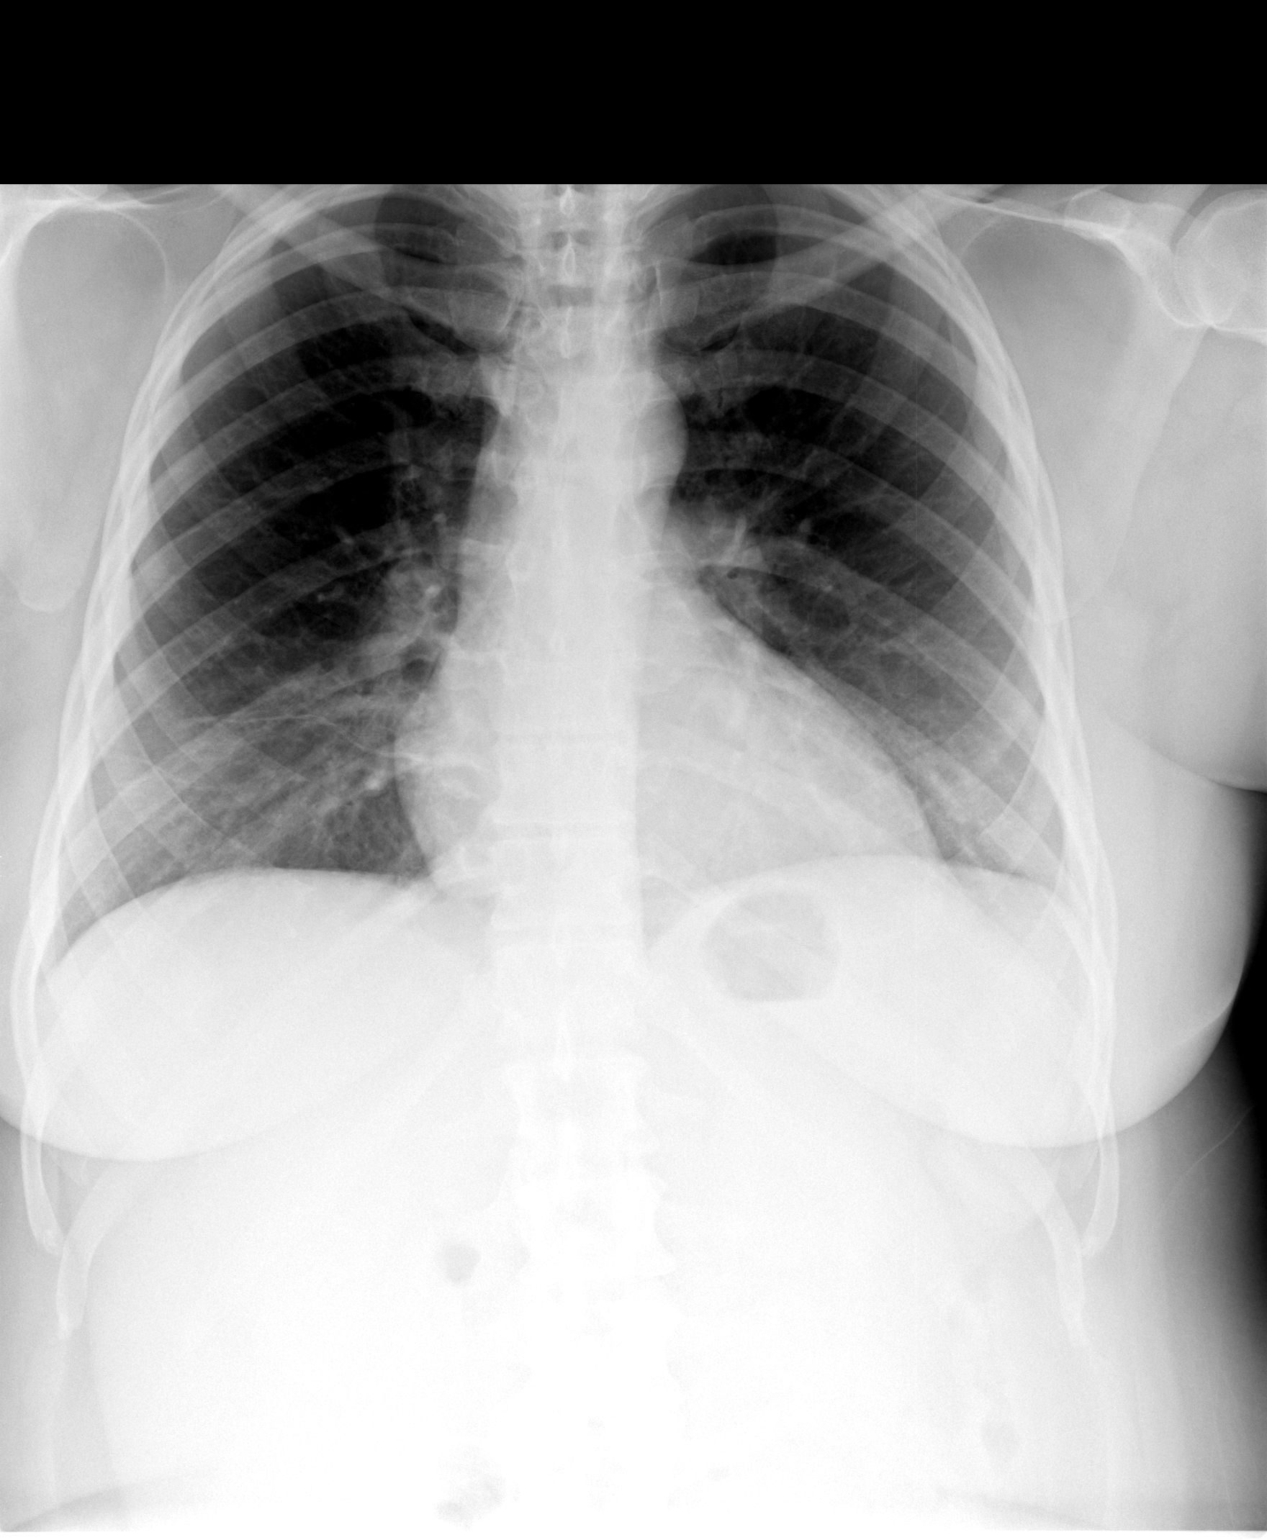

[view not recorded (2 of 2)]
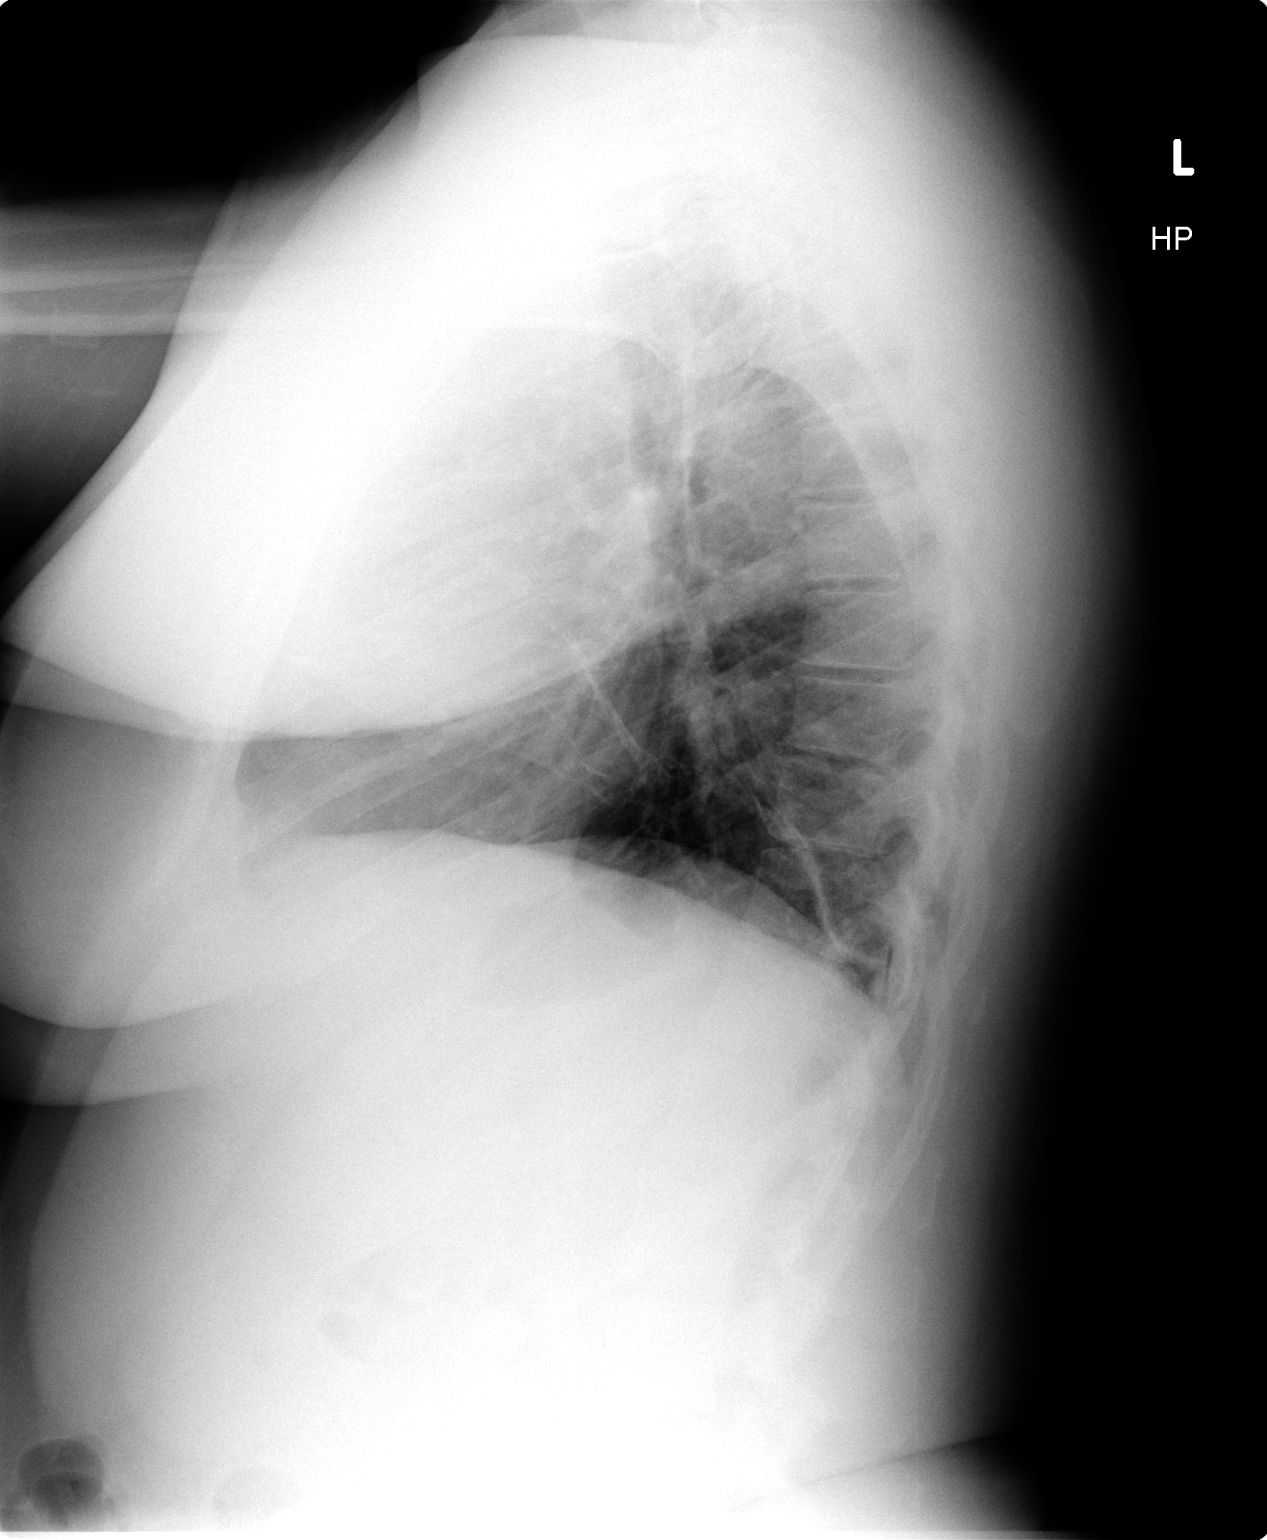

[2 of 2 positions shown; findings below may reference images not displayed]

## 2006-03-29 ENCOUNTER — Emergency Department (HOSPITAL_COMMUNITY): Admission: EM | Admit: 2006-03-29 | Discharge: 2006-03-29 | Payer: Self-pay | Admitting: Emergency Medicine

## 2006-04-06 ENCOUNTER — Emergency Department (HOSPITAL_COMMUNITY): Admission: EM | Admit: 2006-04-06 | Discharge: 2006-04-06 | Payer: Self-pay | Admitting: Emergency Medicine

## 2006-06-11 ENCOUNTER — Emergency Department (HOSPITAL_COMMUNITY): Admission: EM | Admit: 2006-06-11 | Discharge: 2006-06-11 | Payer: Self-pay | Admitting: Family Medicine

## 2006-06-17 ENCOUNTER — Emergency Department (HOSPITAL_COMMUNITY): Admission: EM | Admit: 2006-06-17 | Discharge: 2006-06-17 | Payer: Self-pay | Admitting: Emergency Medicine

## 2006-07-17 IMAGING — CR DG CHEST 2V
2 series · 2 of 2 positions shown · non-contrast
Comparison: none

CLINICAL DATA: Chest pain

CHEST - 2 VIEW

[view not recorded (1 of 2)]
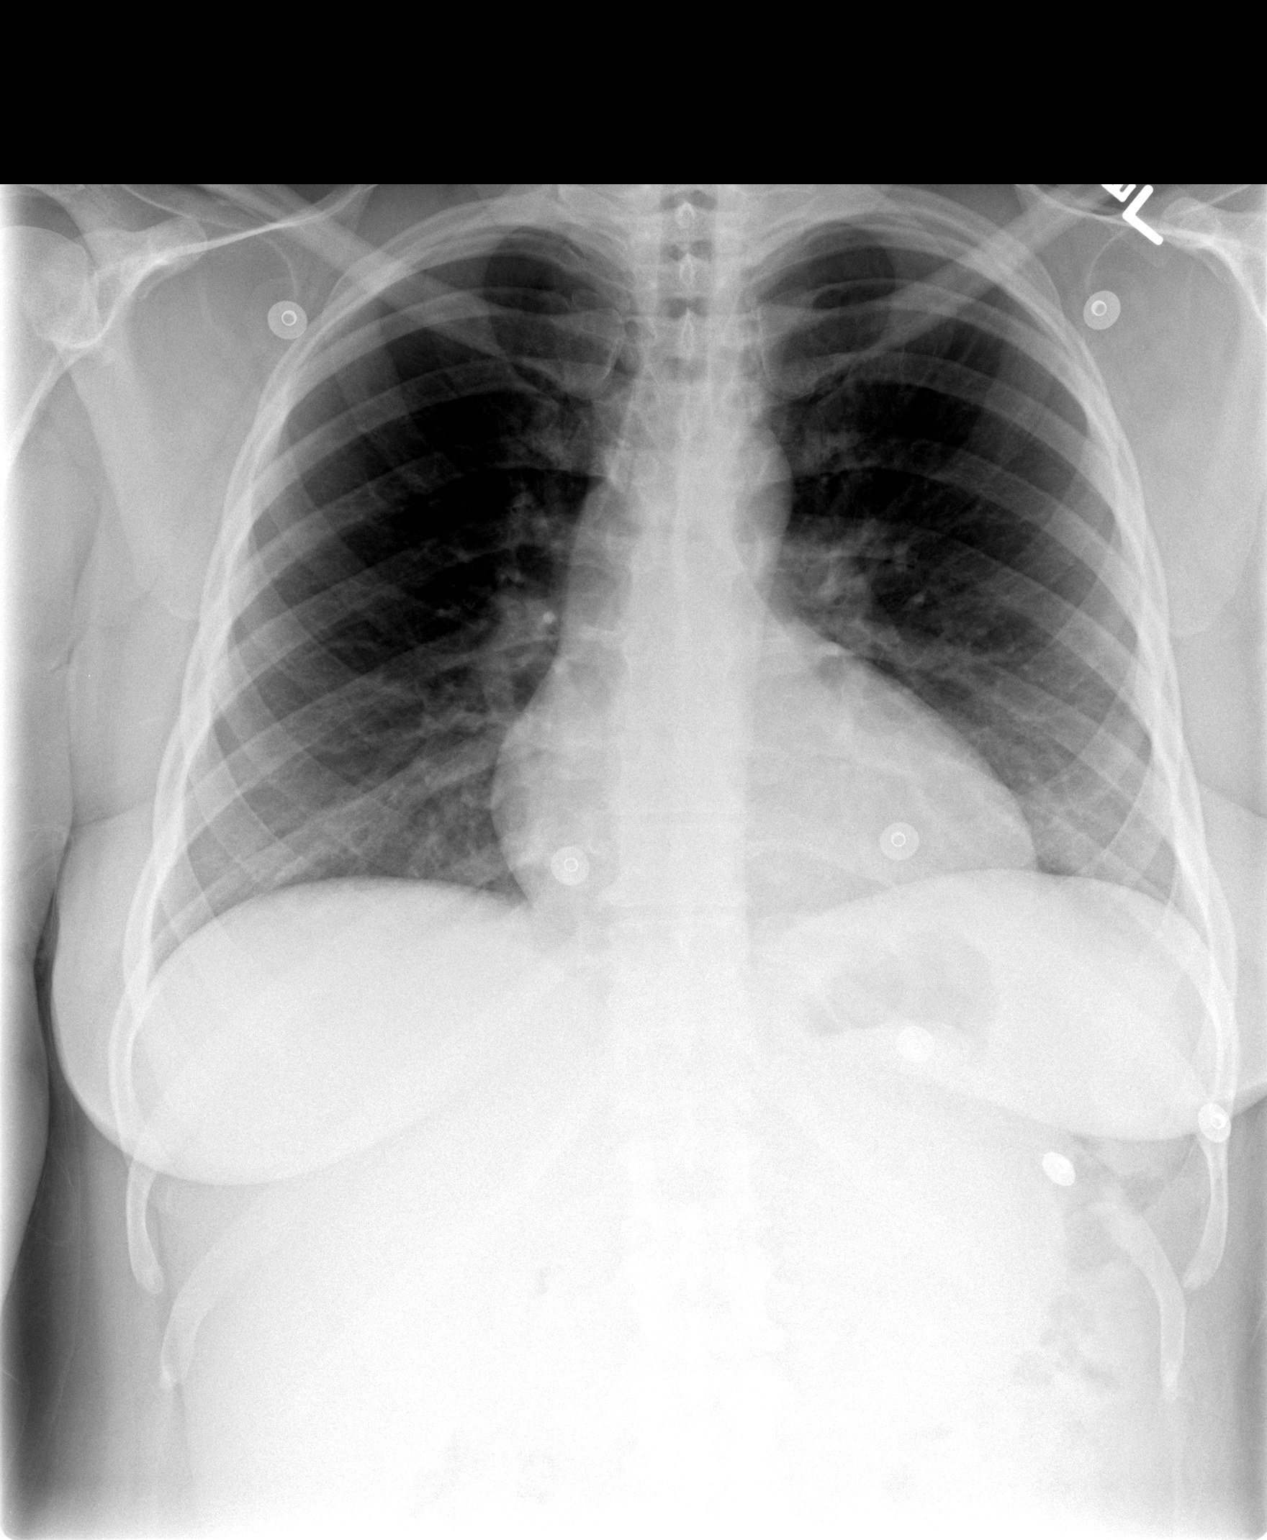

[view not recorded (2 of 2)]
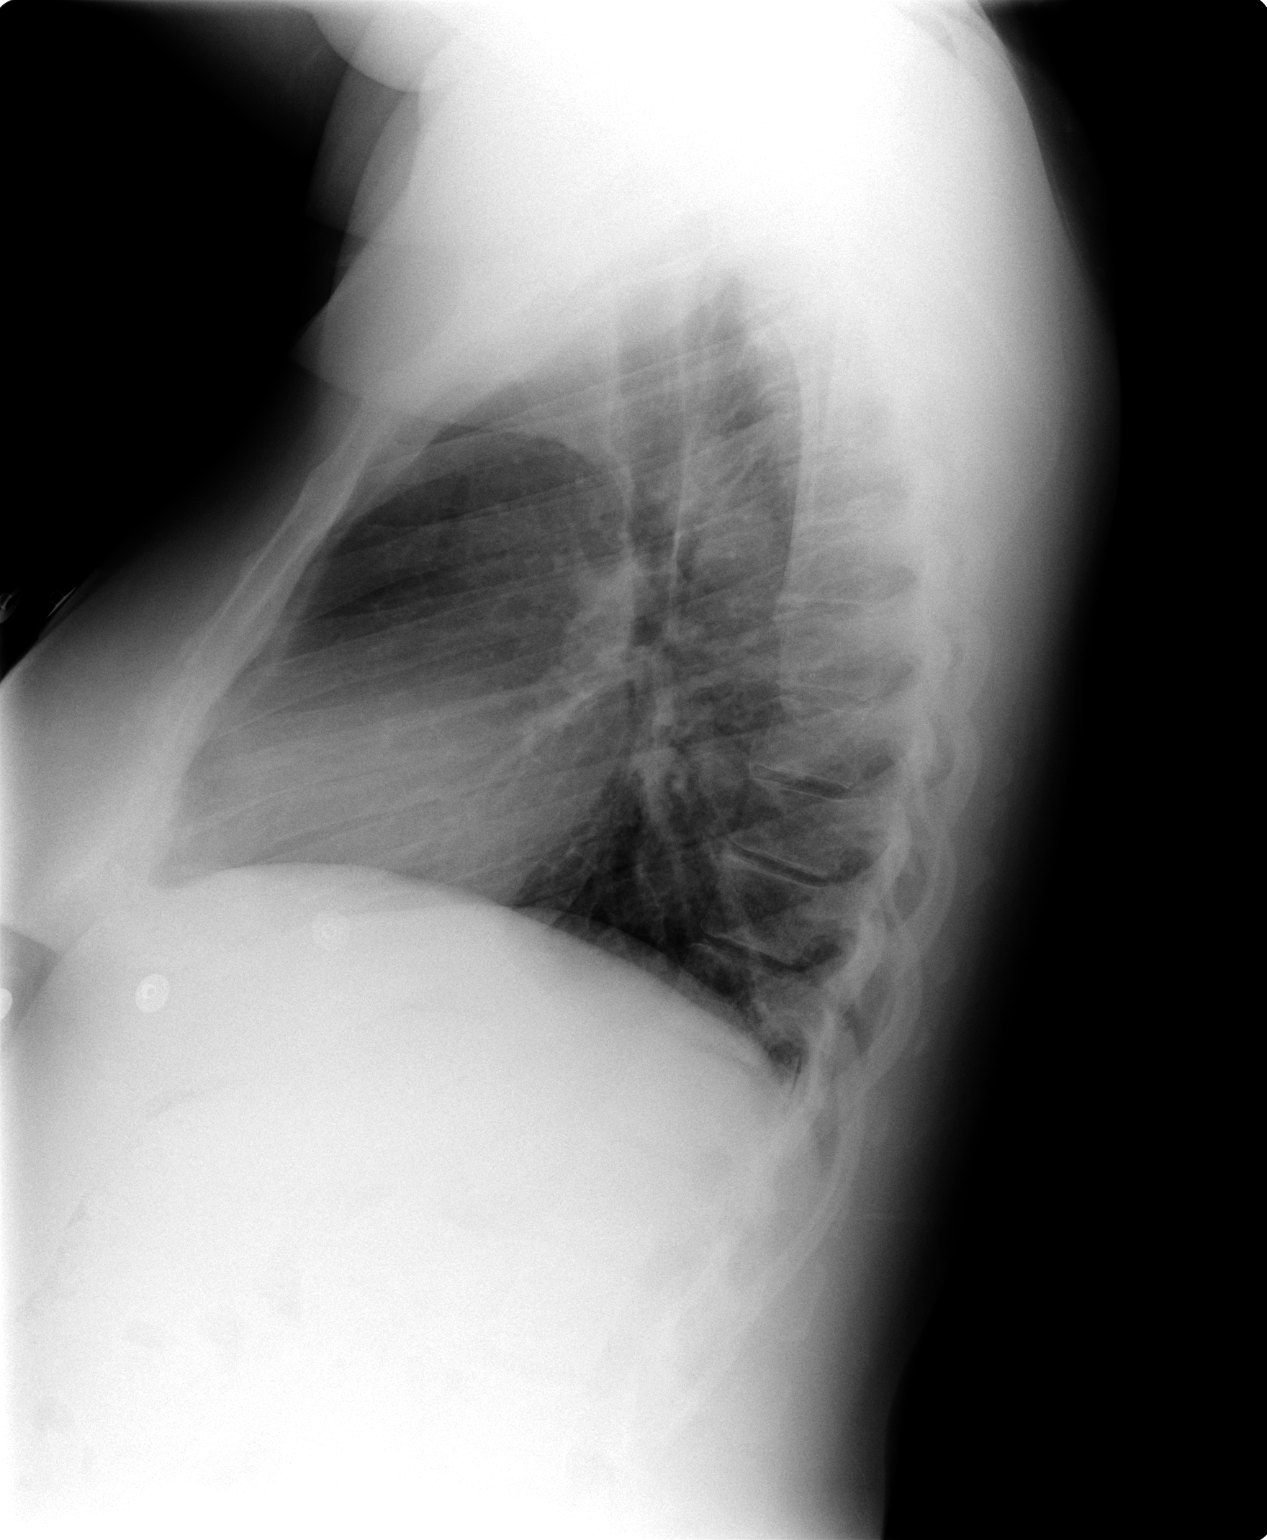

[2 of 2 positions shown; findings below may reference images not displayed]

FINDINGS: The heart size and mediastinal contours are within normal limits.  Both lungs are clear.
The visualized skeletal structures are unremarkable.

IMPRESSION

No active cardiopulmonary disease.

## 2006-11-01 ENCOUNTER — Emergency Department (HOSPITAL_COMMUNITY): Admission: EM | Admit: 2006-11-01 | Discharge: 2006-11-02 | Payer: Self-pay | Admitting: Emergency Medicine

## 2006-12-02 ENCOUNTER — Encounter: Admission: RE | Admit: 2006-12-02 | Discharge: 2007-03-02 | Payer: Self-pay | Admitting: Orthopedic Surgery

## 2007-01-24 ENCOUNTER — Inpatient Hospital Stay (HOSPITAL_COMMUNITY): Admission: AD | Admit: 2007-01-24 | Discharge: 2007-01-24 | Payer: Self-pay | Admitting: Gynecology

## 2007-02-17 ENCOUNTER — Emergency Department (HOSPITAL_COMMUNITY): Admission: EM | Admit: 2007-02-17 | Discharge: 2007-02-17 | Payer: Self-pay | Admitting: Emergency Medicine

## 2007-02-23 ENCOUNTER — Emergency Department (HOSPITAL_COMMUNITY): Admission: EM | Admit: 2007-02-23 | Discharge: 2007-02-23 | Payer: Self-pay | Admitting: Emergency Medicine

## 2007-03-27 ENCOUNTER — Emergency Department (HOSPITAL_COMMUNITY): Admission: EM | Admit: 2007-03-27 | Discharge: 2007-03-27 | Payer: Self-pay | Admitting: Emergency Medicine

## 2007-04-04 ENCOUNTER — Emergency Department (HOSPITAL_COMMUNITY): Admission: EM | Admit: 2007-04-04 | Discharge: 2007-04-04 | Payer: Self-pay | Admitting: Emergency Medicine

## 2007-04-15 IMAGING — CR DG LUMBAR SPINE COMPLETE 4+V
5 series · 5 of 5 positions shown · non-contrast
Comparison: 01/28/2004.

CLINICAL DATA: Low back pain.  Left hip and leg pain  
 LUMBAR SPINE - FIVE VIEW:

[t l-spine a.p.]
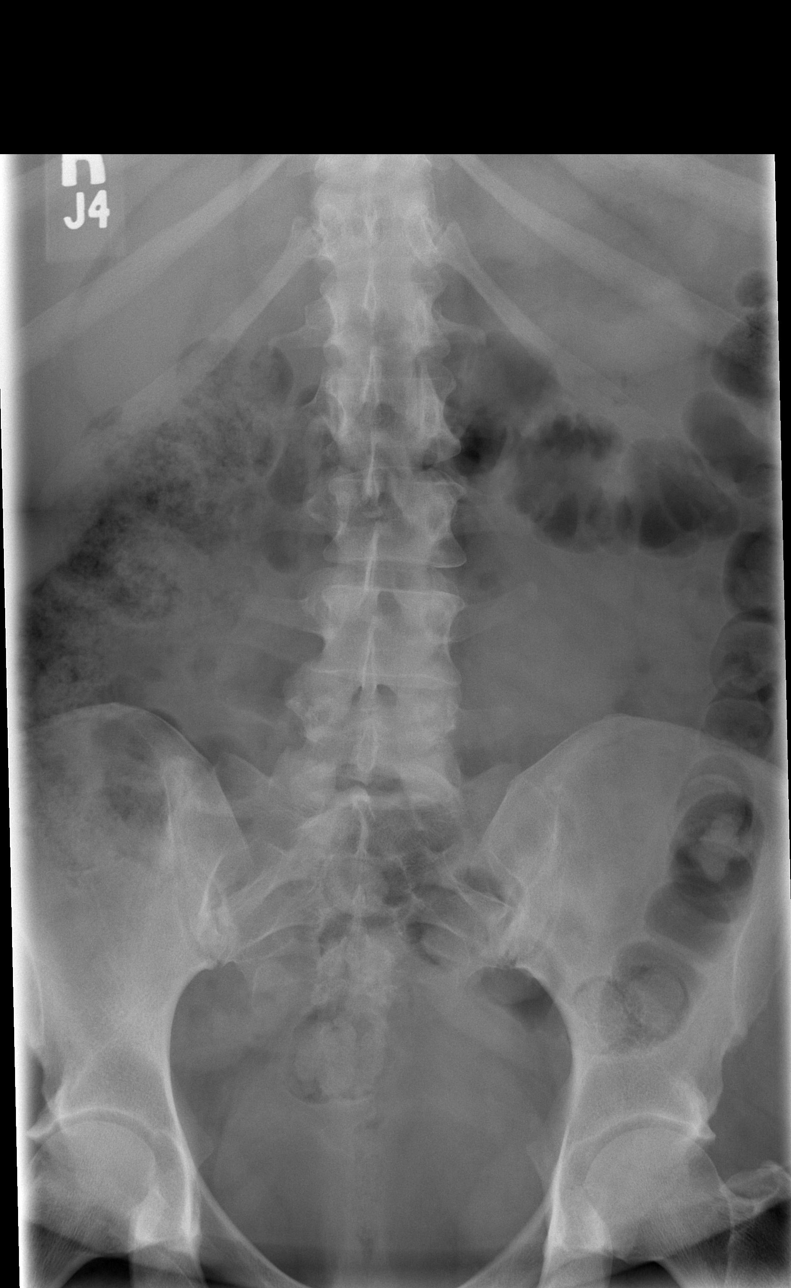

[t l-spine oblique exposure (1 of 2)]
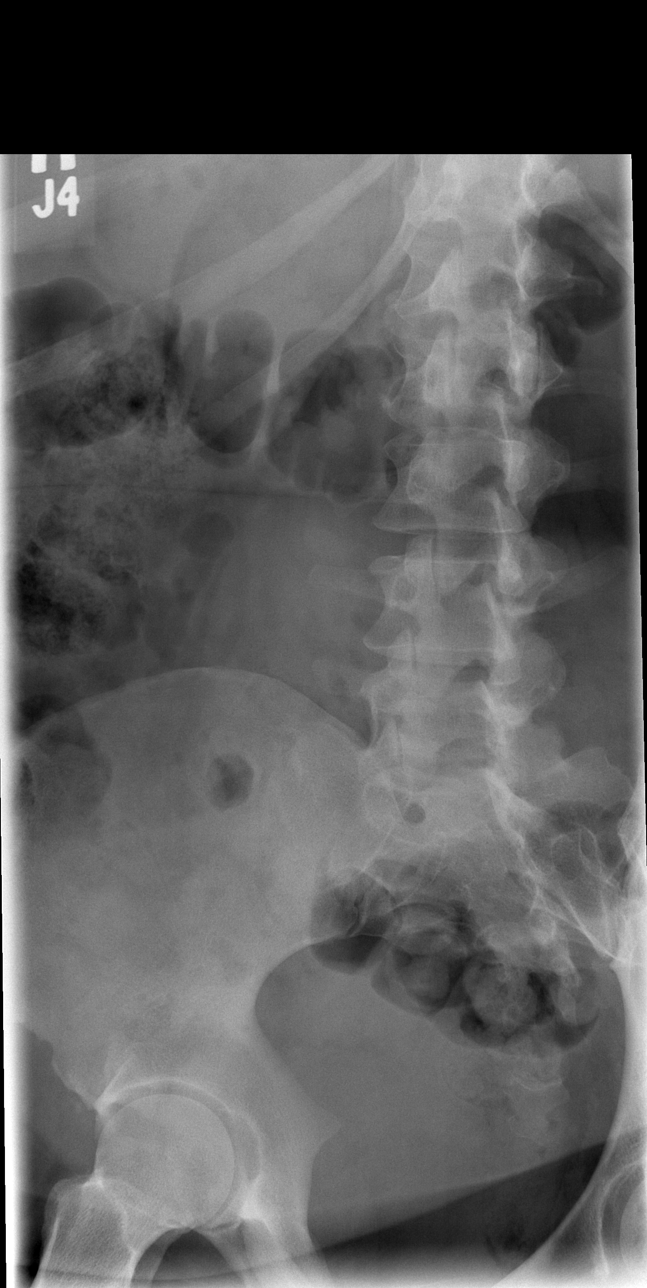

[t l-spine oblique exposure (2 of 2)]
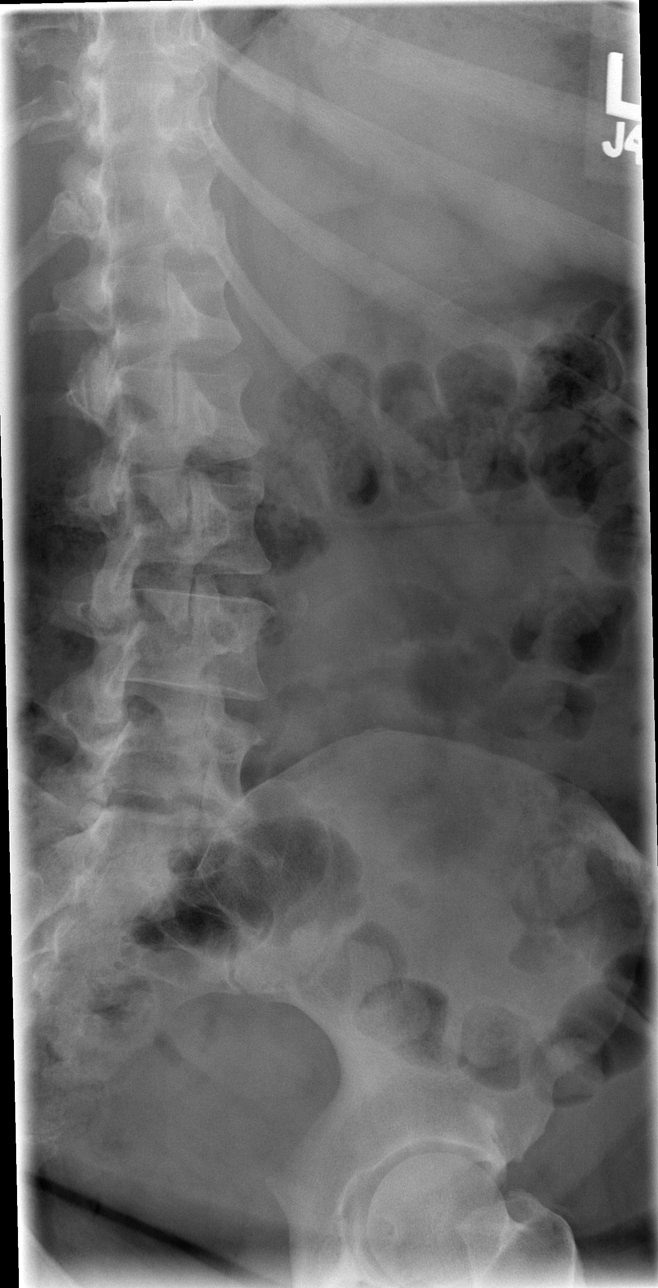

[t l-spine lat]
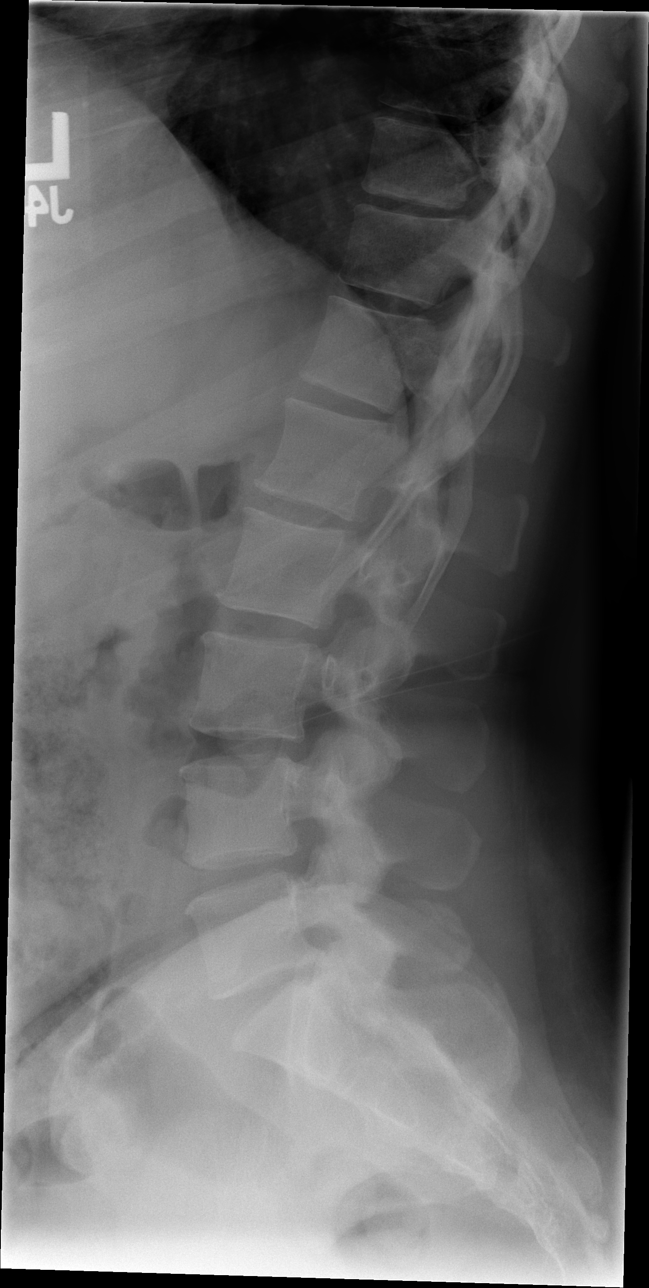

[t l-spine l5-s1 spot]
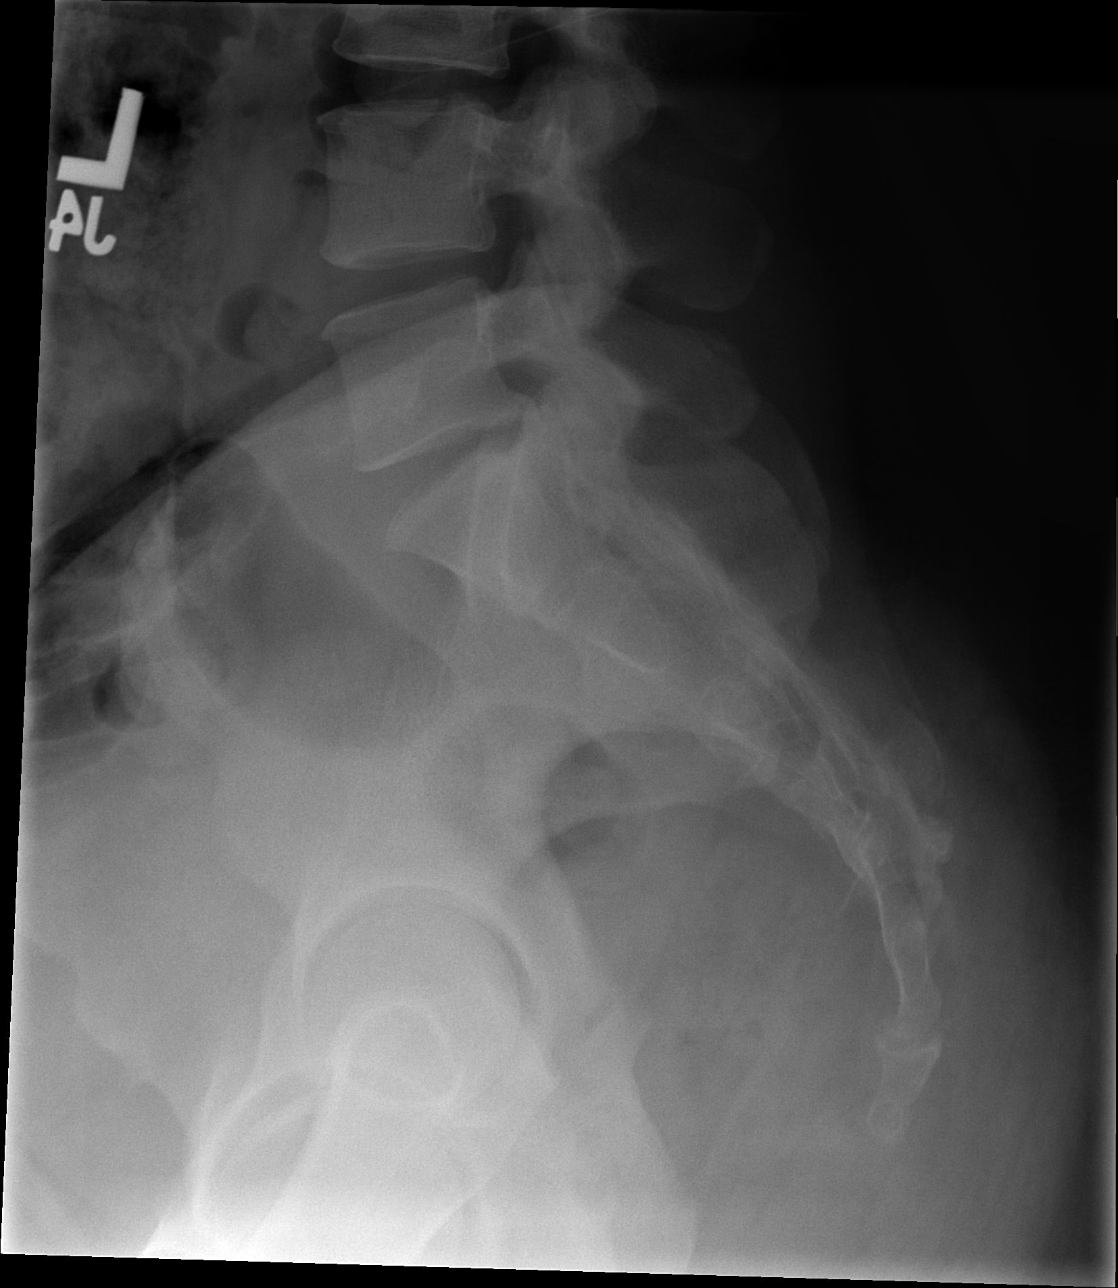

[5 of 5 positions shown; findings below may reference images not displayed]

Minimal mid to upper lumbar scoliosis convex to the left.  Mild degenerative changes L4-5 and L5-S1 facet joints.  No disk space narrowing.  Small osteophytes.
IMPRESSION: No acute lumbar spine abnormality.  Findings similar to prior exam 01/28/2004.  Mild degenerative changes.

## 2007-07-13 ENCOUNTER — Emergency Department (HOSPITAL_COMMUNITY): Admission: EM | Admit: 2007-07-13 | Discharge: 2007-07-14 | Payer: Self-pay | Admitting: Emergency Medicine

## 2007-09-08 IMAGING — CR DG CHEST 1V PORT
1 series · 1 of 1 positions shown · non-contrast
Comparison: 06/12/04.

CLINICAL DATA: Shortness of breath.  Chest pain.  
 PORTABLE CHEST - 1 VIEW 08/04/05:

[view not recorded]
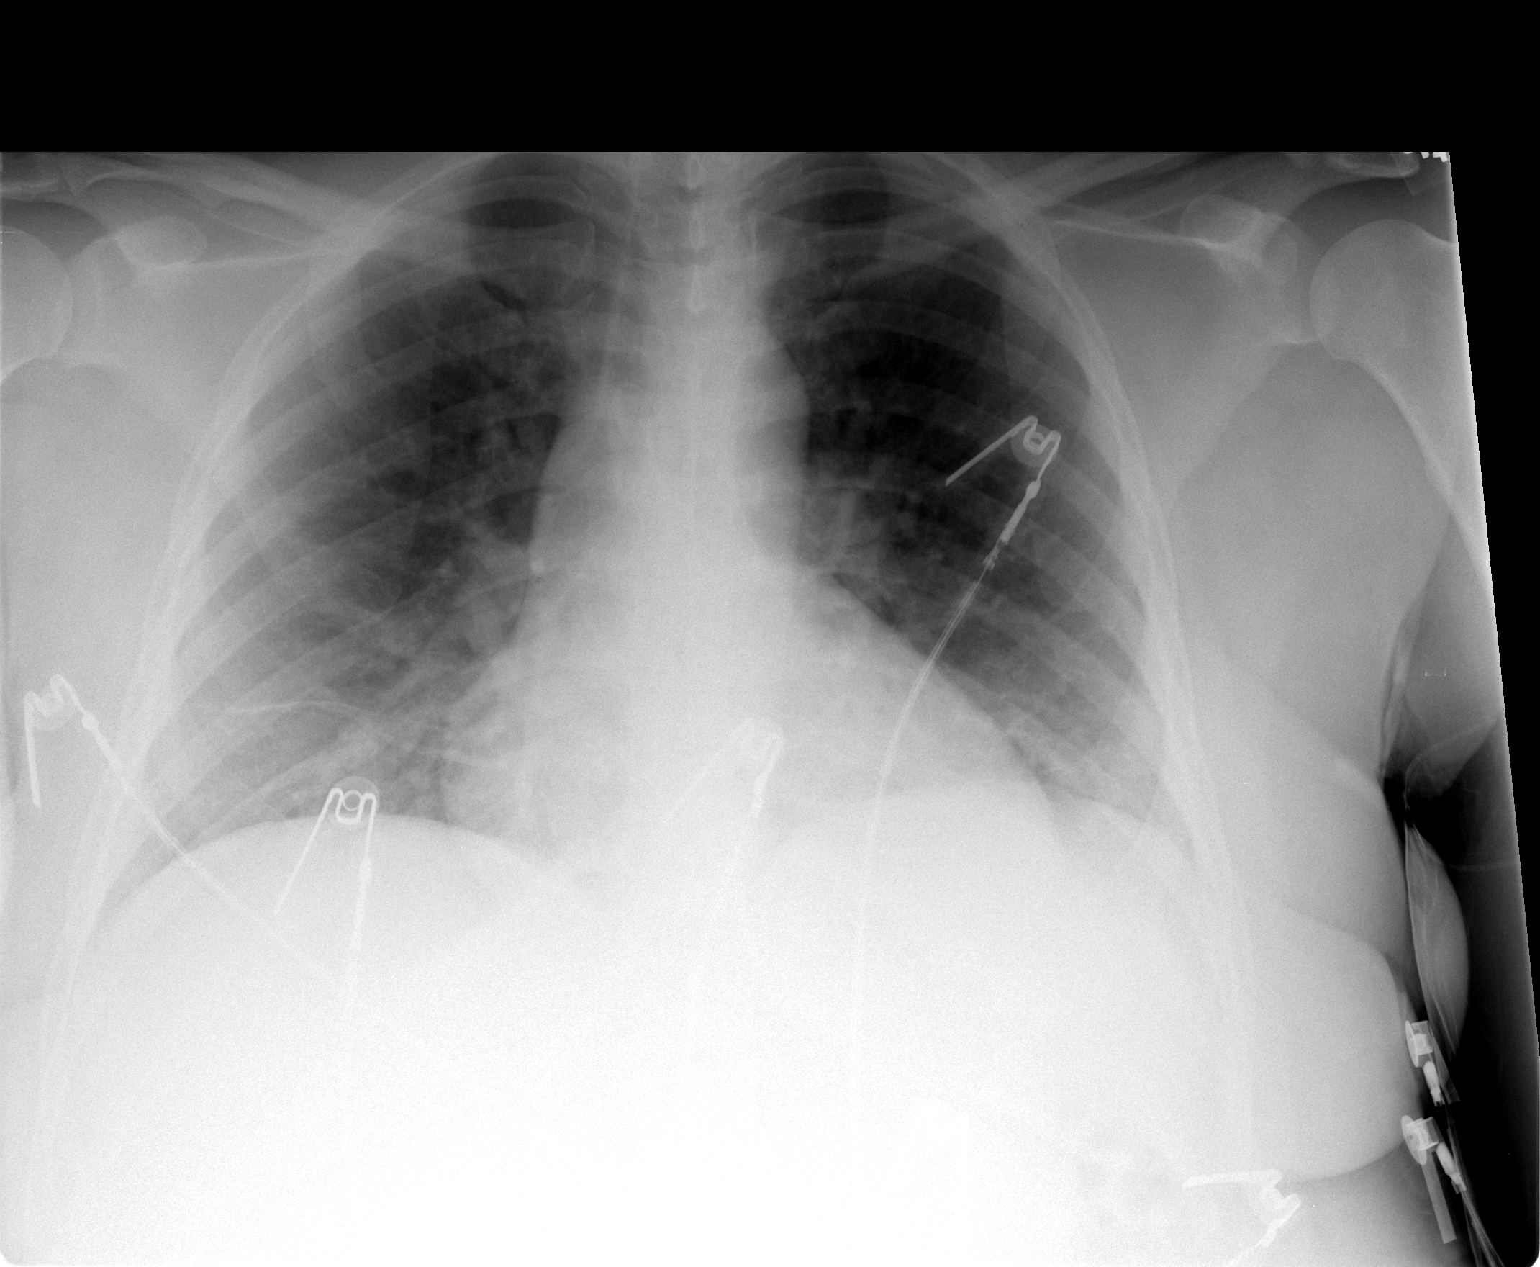

[1 of 1 positions shown; findings below may reference images not displayed]

FINDINGS: Heart size is enlarged with pulmonary vascular congestion.  Some subsegmental atelectasis in the lung bases.  No effusion.  No focal bony abnormality.
IMPRESSION: Cardiomegaly and vascular congestion with bibasilar subsegmental atelectasis.
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
## 2007-10-03 ENCOUNTER — Inpatient Hospital Stay (HOSPITAL_COMMUNITY): Admission: AD | Admit: 2007-10-03 | Discharge: 2007-10-03 | Payer: Self-pay | Admitting: Obstetrics and Gynecology

## 2007-11-08 ENCOUNTER — Inpatient Hospital Stay (HOSPITAL_COMMUNITY): Admission: EM | Admit: 2007-11-08 | Discharge: 2007-11-10 | Payer: Self-pay | Admitting: Emergency Medicine

## 2007-11-09 ENCOUNTER — Encounter (INDEPENDENT_AMBULATORY_CARE_PROVIDER_SITE_OTHER): Payer: Self-pay | Admitting: Surgery

## 2007-11-09 HISTORY — PX: CHOLECYSTECTOMY, LAPAROSCOPIC: SHX56

## 2008-03-28 ENCOUNTER — Emergency Department (HOSPITAL_COMMUNITY): Admission: EM | Admit: 2008-03-28 | Discharge: 2008-03-28 | Payer: Self-pay | Admitting: Emergency Medicine

## 2008-04-03 ENCOUNTER — Emergency Department (HOSPITAL_COMMUNITY): Admission: EM | Admit: 2008-04-03 | Discharge: 2008-04-03 | Payer: Self-pay | Admitting: Emergency Medicine

## 2008-05-10 IMAGING — CR DG CHEST 2V
2 series · 2 of 2 positions shown · non-contrast
Comparison: 08/04/05.

CLINICAL DATA: Chest and back pain, shortness of breath.  
 CHEST ? 2 VIEW:

[view not recorded (1 of 2)]
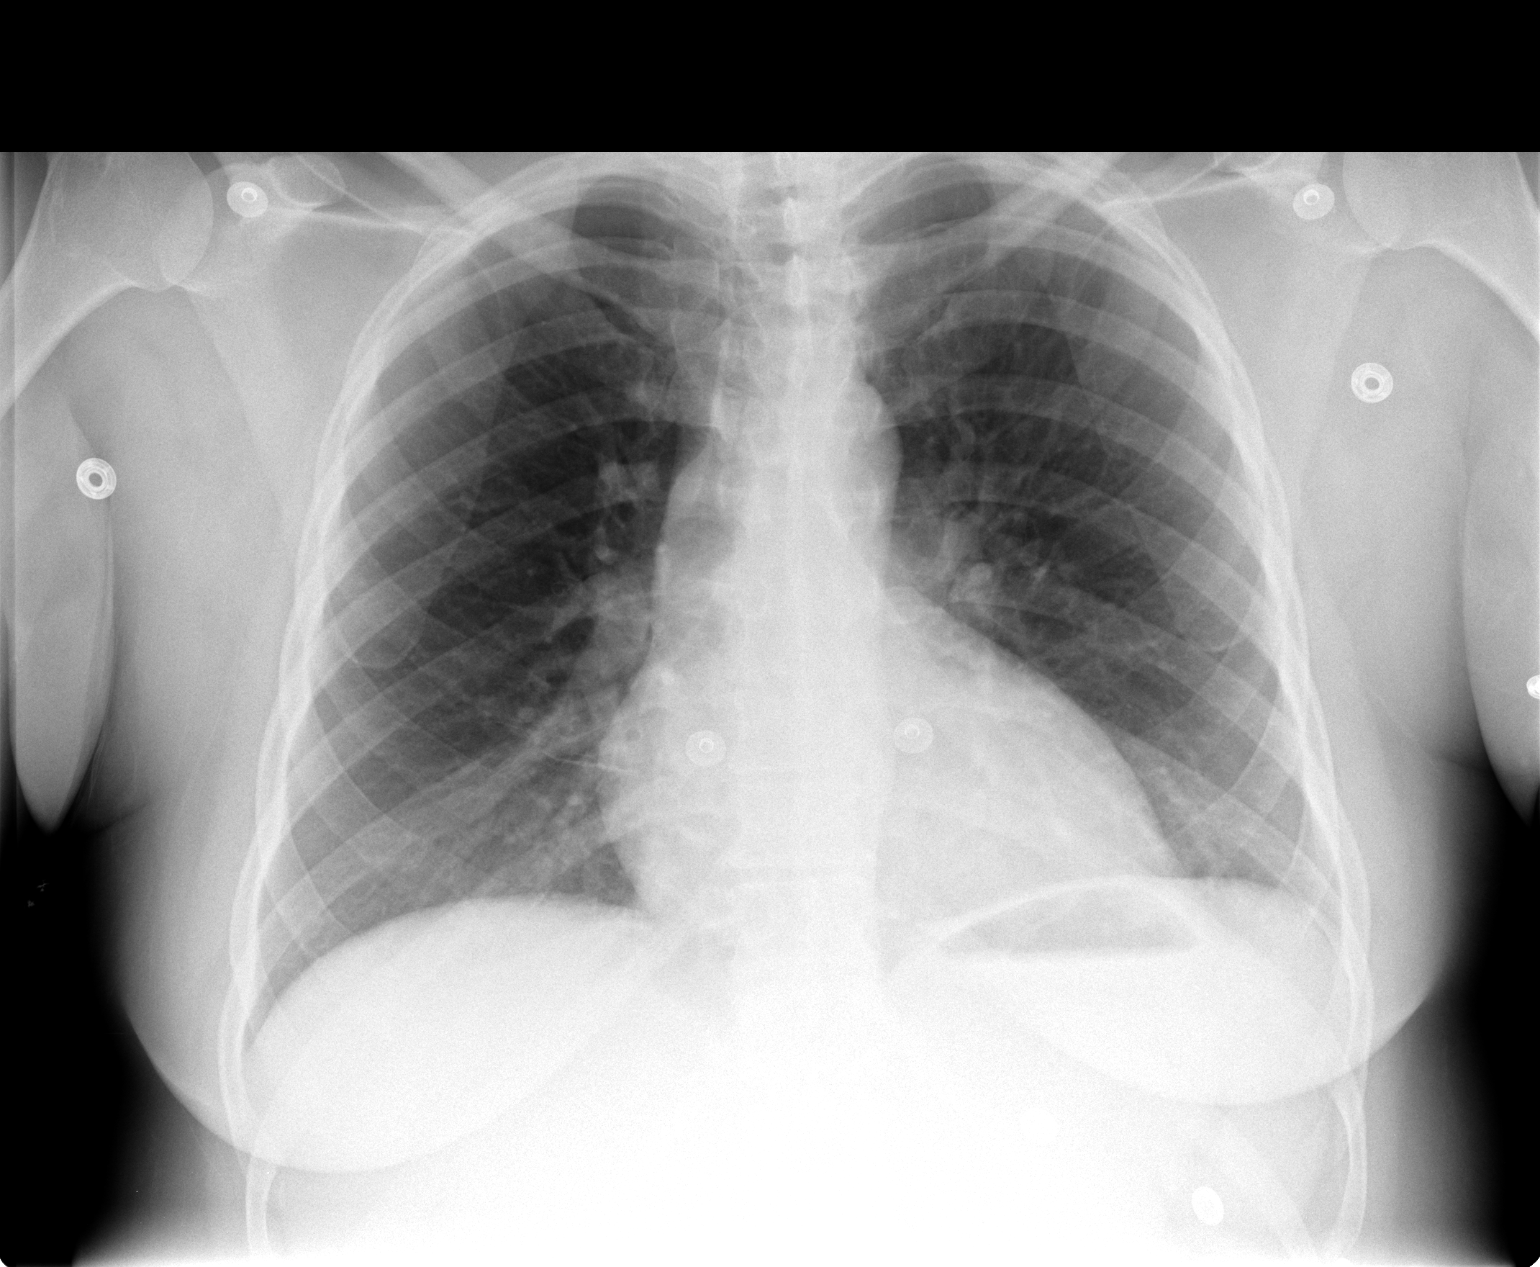

[view not recorded (2 of 2)]
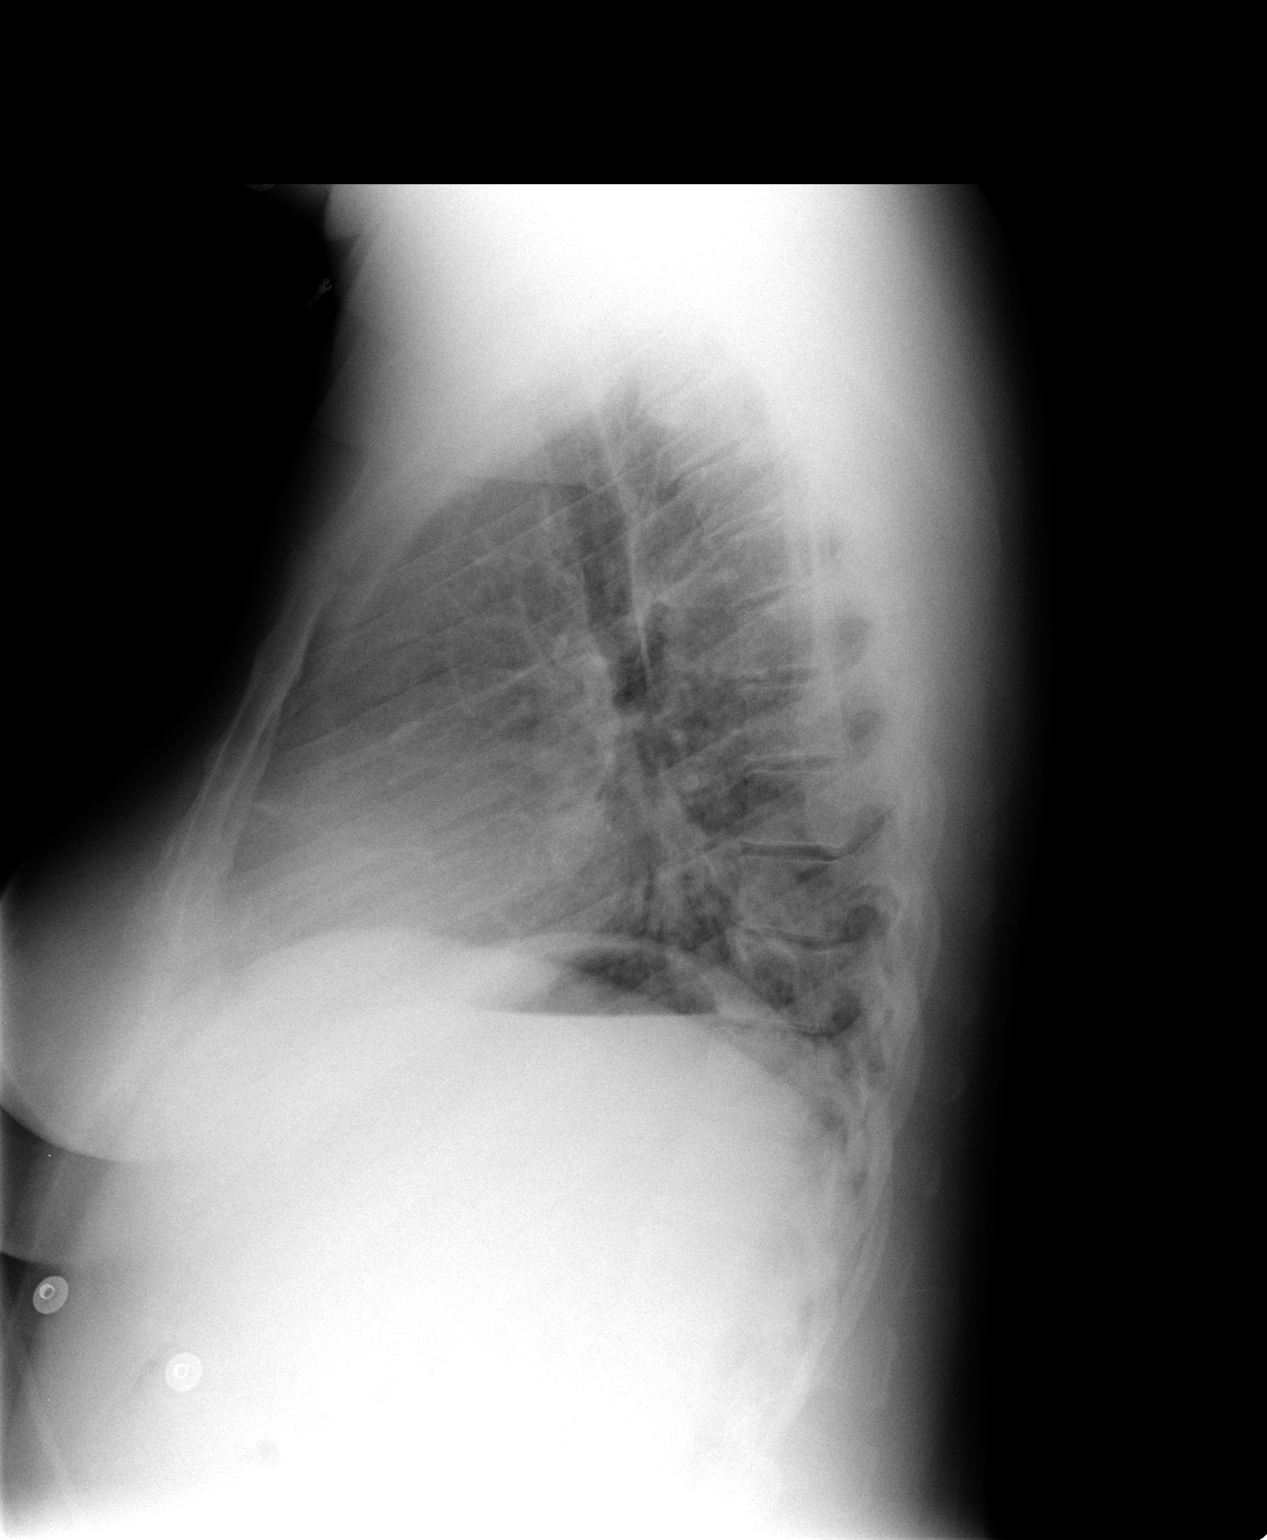

[2 of 2 positions shown; findings below may reference images not displayed]

FINDINGS: There is borderline cardiomegaly.  No edema.  No pulmonary venous hypertension is detected.  Lungs appear clear.  There are low lung volumes on the lateral view contributing to vascular crowding.
IMPRESSION: Borderline cardiomegaly.

## 2008-05-10 IMAGING — CT CT ABDOMEN W/O CM
1 of 2 series · 15 of 32 positions shown, 19 images · IV contrast (agent unspecified)
Comparison: Acute abdomen series of 11/03/03.

CLINICAL DATA: Left flank pain.
ABDOMEN CT WITHOUT CONTRAST:
TECHNIQUE: Multidetector CT imaging of the abdomen was performed following the standard protocol without IV contrast.
TECHNIQUE: Multidetector CT imaging of the pelvis was performed following the standard protocol without IV contrast.

[Series 2: abd/pelv w/o 5.0 b31f st · axial · non-contrast · 0.76mm/px · z∈[-464,-54]mm · 15 of 92 slices shown, 19 images]
[im 5/92  soft-tissue]
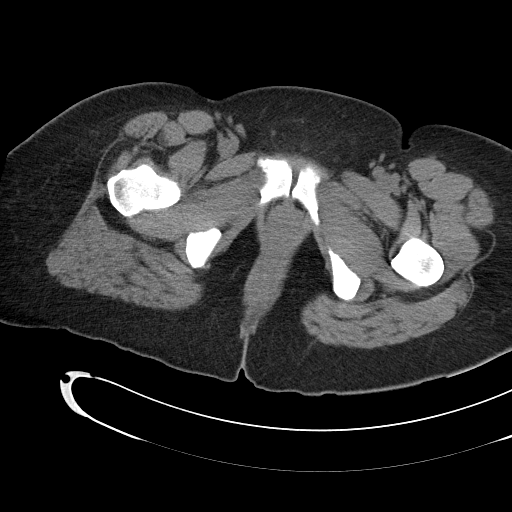
[im 5/92  bone]
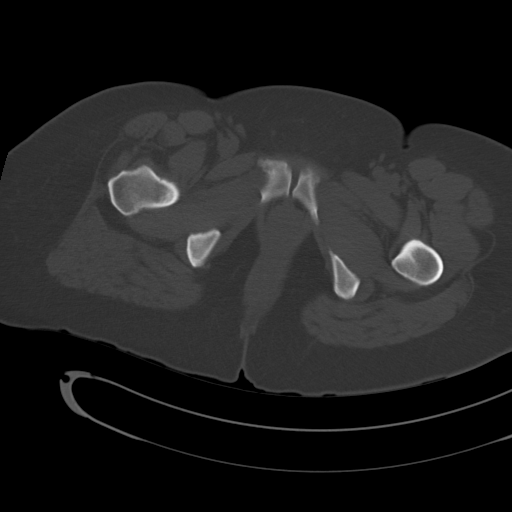
[im 13/92  soft-tissue]
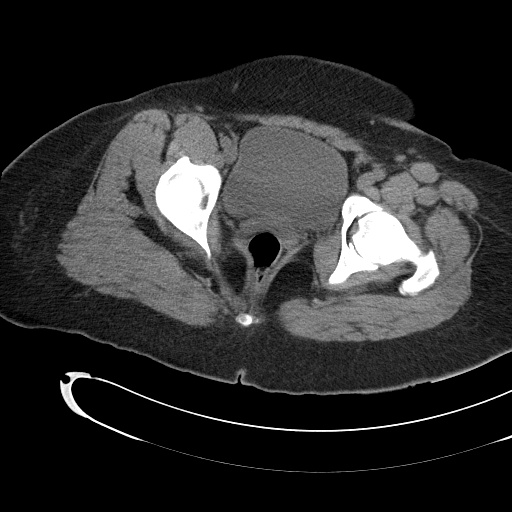
[im 21/92  soft-tissue]
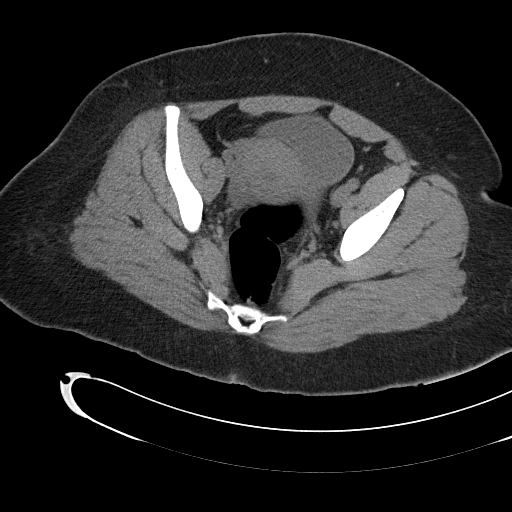
[im 25/92  soft-tissue]
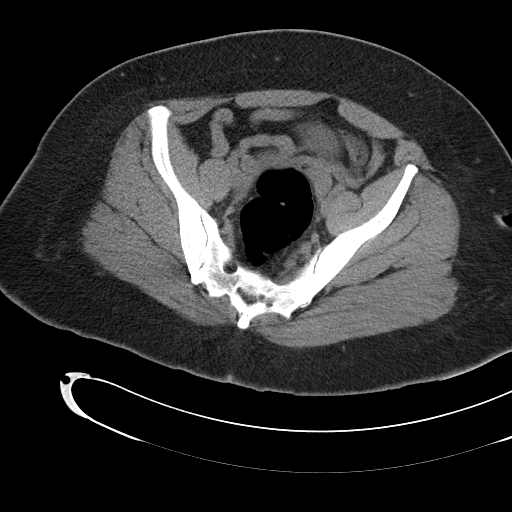
[im 34/92  soft-tissue]
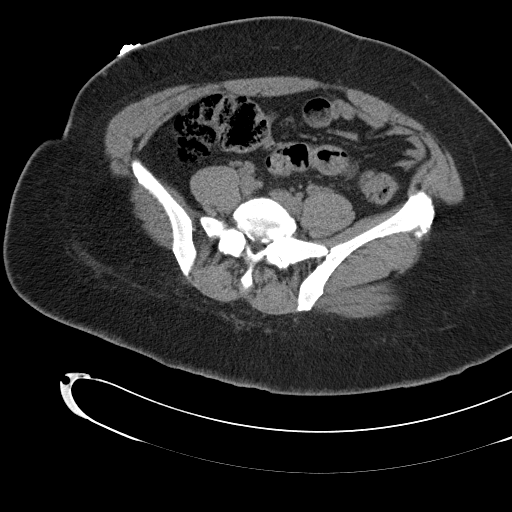
[im 38/92  soft-tissue]
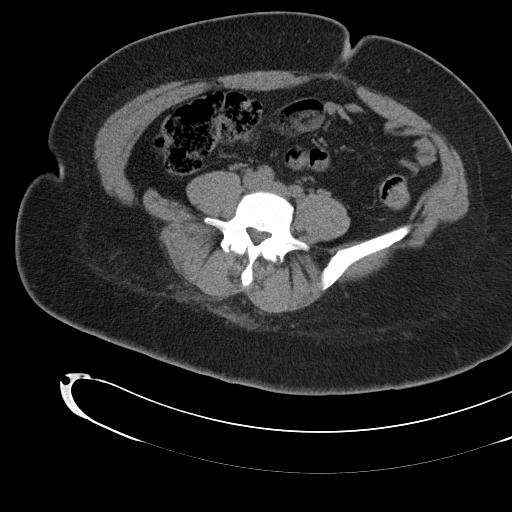
[im 46/92  soft-tissue]
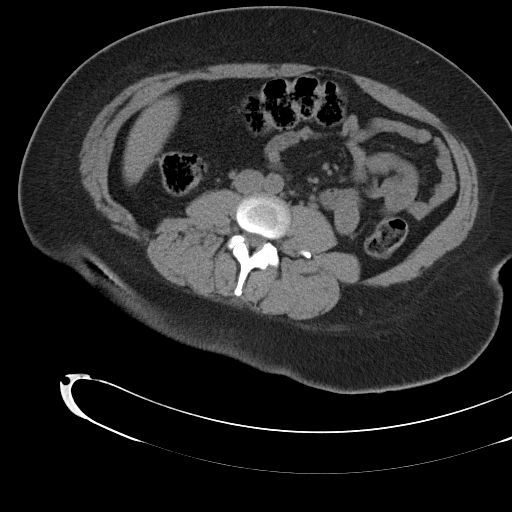
[im 54/92  soft-tissue]
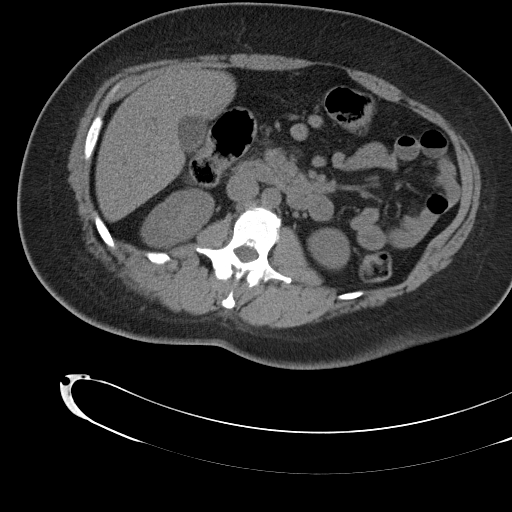
[im 58/92  soft-tissue]
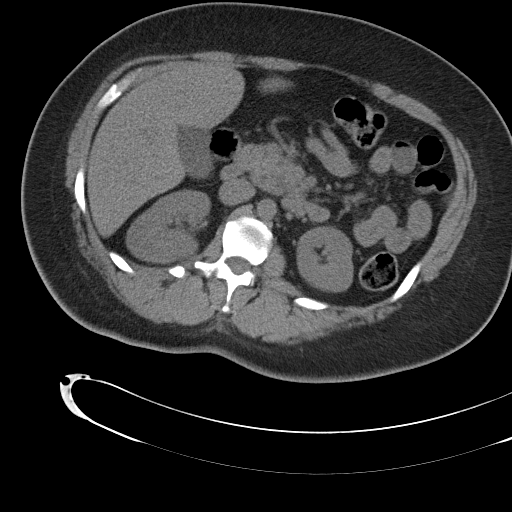
[im 58/92  bone]
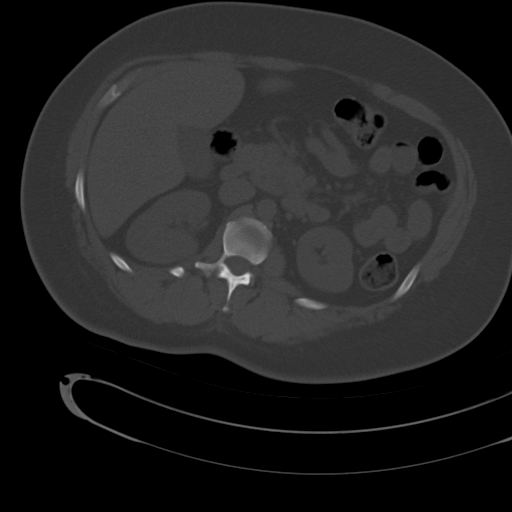
[im 67/92  soft-tissue]
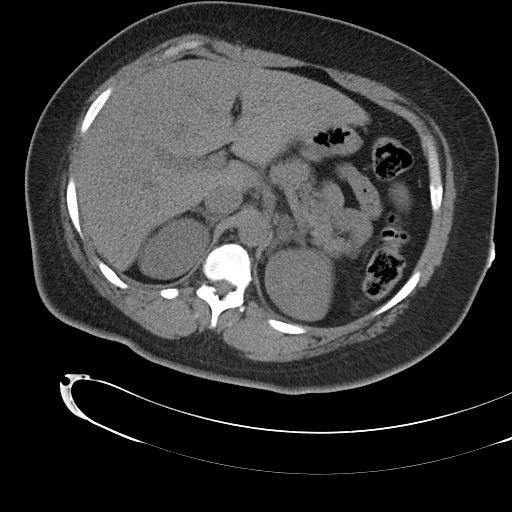
[im 71/92  soft-tissue]
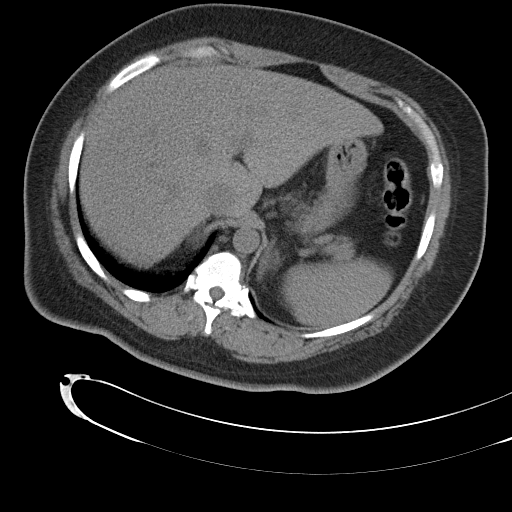
[im 75/92  lung]
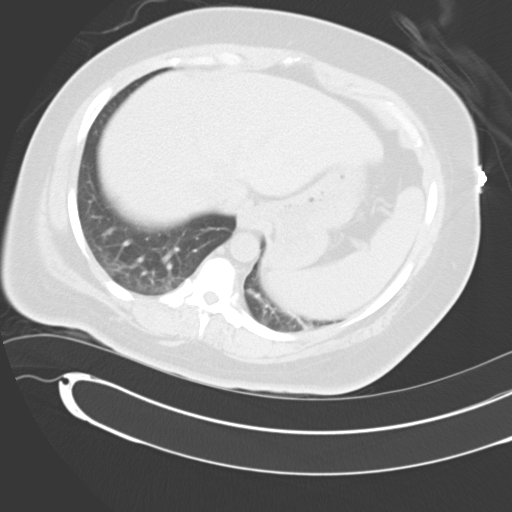
[im 79/92  soft-tissue]
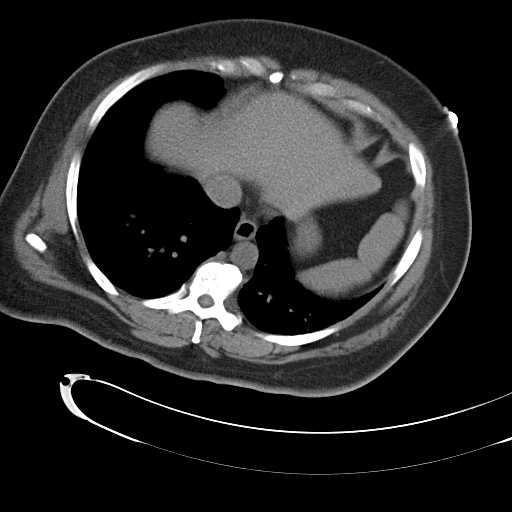
[im 79/92  lung]
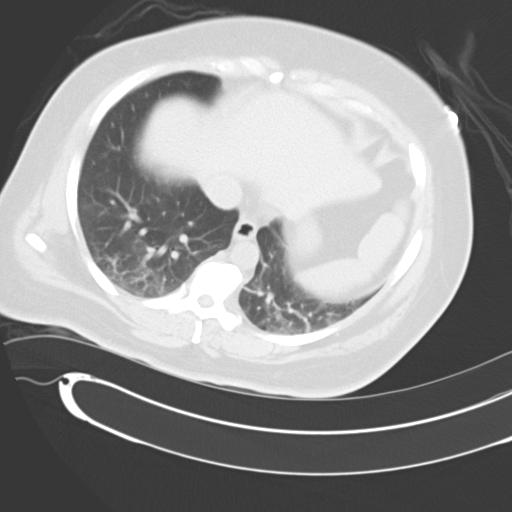
[im 83/92  lung]
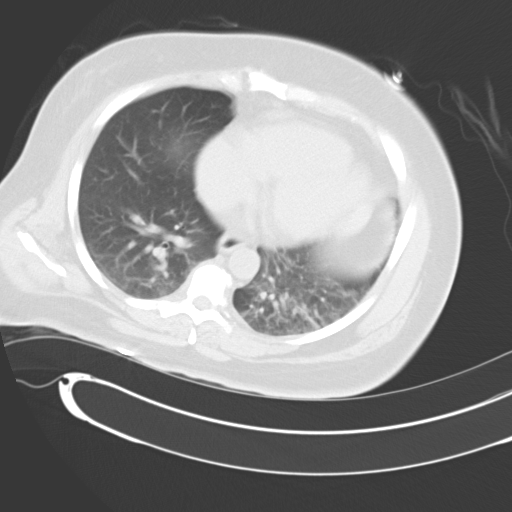
[im 87/92  soft-tissue]
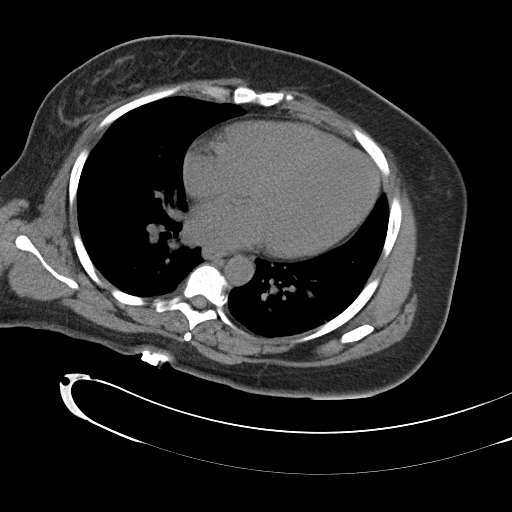
[im 87/92  lung]
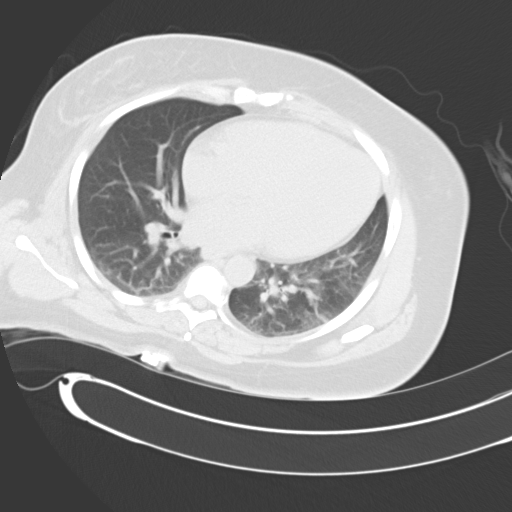

[15 of 32 positions shown; findings below may reference images not displayed]

FINDINGS: In the left lower lobe, there is linear subsegmental atelectasis.  The liver, gallbladder, spleen and pancreas appear unremarkable.  
There is prominence of the left adrenal gland suggesting an adrenal mass.  This measures 5 Hounsfield units and likely represents adrenal adenoma.  This is not high in density to suggest adrenal hemorrhage.  
No hydronephrosis or hydroureter is identified.  
No retroperitoneal adenopathy is noted.
IMPRESSION: 1. No acute upper abdominal findings.
2. Left basilar subsegmental atelectasis.  

PELVIS CT WITHOUT CONTRAST:
FINDINGS: The appendix appears unremarkable.  No free pelvic fluid.  No evidence of distal hydroureter.  Uterus and adnexa demonstrate no contour abnormality.  Urinary bladder appears unremarkable.
IMPRESSION: No acute pelvic findings.

## 2008-06-16 ENCOUNTER — Emergency Department (HOSPITAL_COMMUNITY): Admission: EM | Admit: 2008-06-16 | Discharge: 2008-06-16 | Payer: Self-pay | Admitting: Emergency Medicine

## 2008-07-01 ENCOUNTER — Emergency Department (HOSPITAL_COMMUNITY): Admission: EM | Admit: 2008-07-01 | Discharge: 2008-07-01 | Payer: Self-pay | Admitting: Emergency Medicine

## 2008-07-21 IMAGING — CR DG CHEST 2V
2 series · 2 of 2 positions shown · non-contrast
Comparison: 04/06/2006

CLINICAL DATA: 35-year-old with chest pain and shortness of breath.  
 CHEST - 2 VIEW:

[w chest pa]
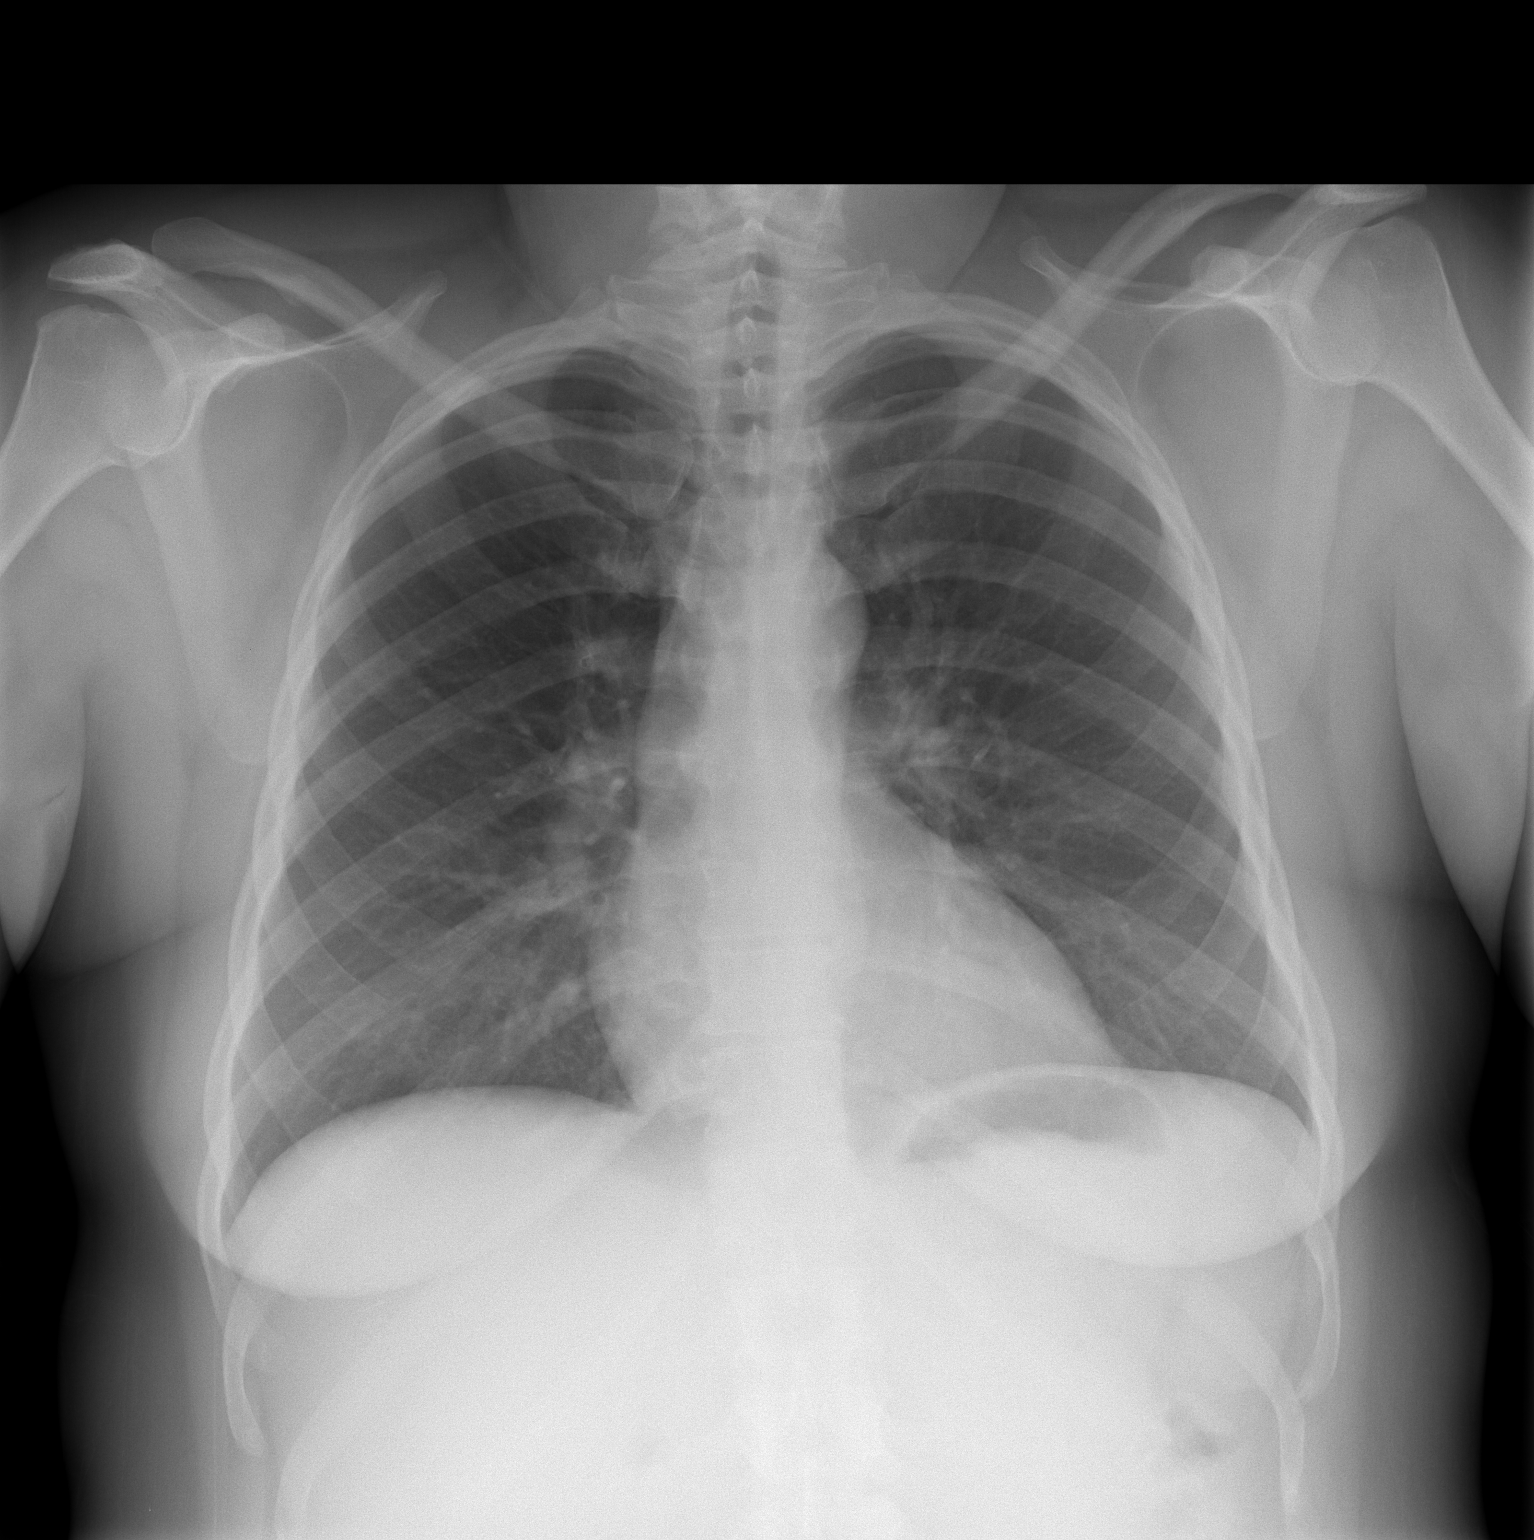

[w chest lat]
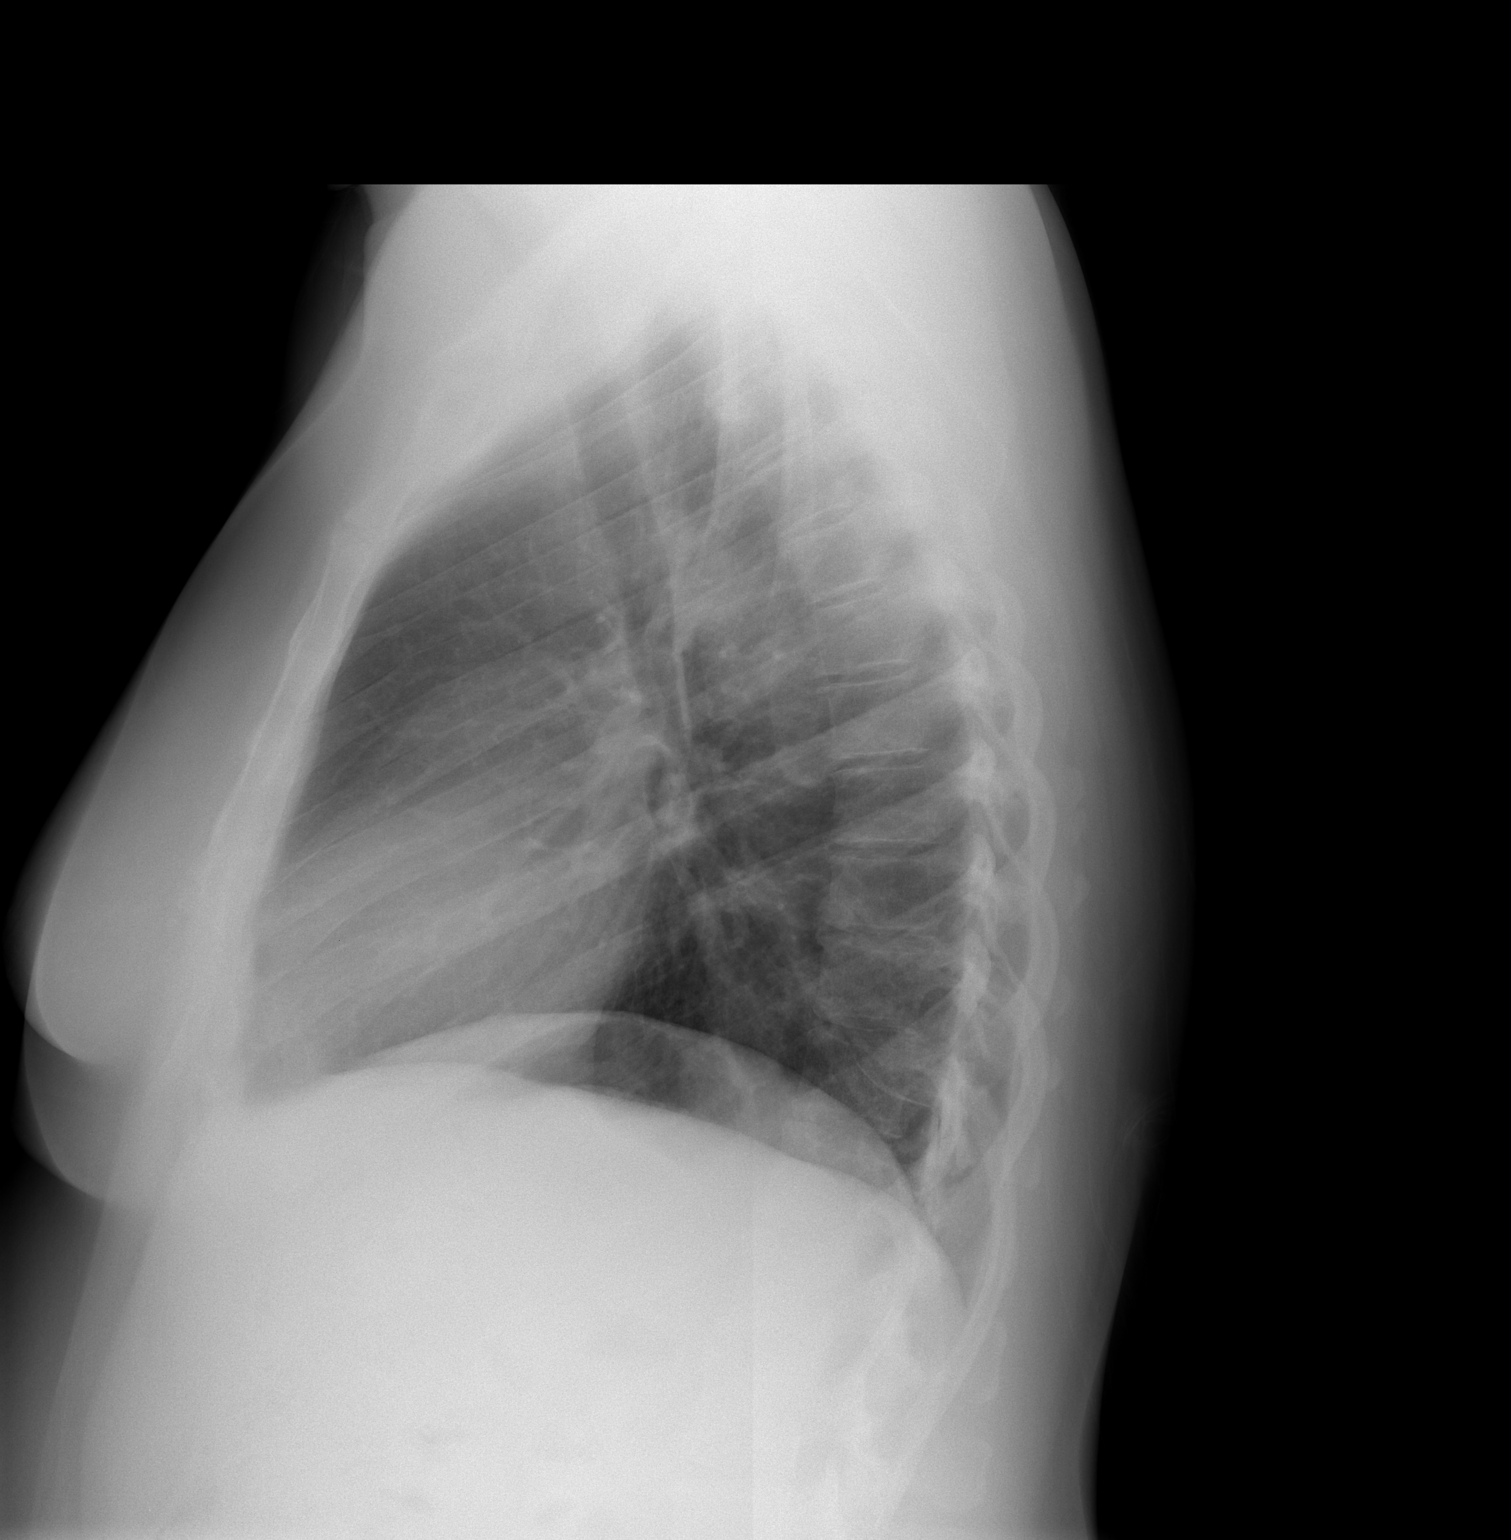

[2 of 2 positions shown; findings below may reference images not displayed]

FINDINGS: Two views of the chest demonstrate clear lungs.  Heart and mediastinum are normal.  Trachea is midline.  Bone structures are intact.
IMPRESSION: No acute cardiopulmonary disease.

## 2008-08-01 ENCOUNTER — Emergency Department (HOSPITAL_COMMUNITY): Admission: EM | Admit: 2008-08-01 | Discharge: 2008-08-02 | Payer: Self-pay | Admitting: Emergency Medicine

## 2008-12-30 ENCOUNTER — Emergency Department (HOSPITAL_COMMUNITY): Admission: EM | Admit: 2008-12-30 | Discharge: 2008-12-30 | Payer: Self-pay | Admitting: Emergency Medicine

## 2009-01-18 ENCOUNTER — Inpatient Hospital Stay (HOSPITAL_COMMUNITY): Admission: AD | Admit: 2009-01-18 | Discharge: 2009-01-18 | Payer: Self-pay | Admitting: Obstetrics & Gynecology

## 2009-02-02 ENCOUNTER — Emergency Department (HOSPITAL_COMMUNITY): Admission: EM | Admit: 2009-02-02 | Discharge: 2009-02-02 | Payer: Self-pay | Admitting: Emergency Medicine

## 2009-03-02 ENCOUNTER — Emergency Department (HOSPITAL_COMMUNITY): Admission: EM | Admit: 2009-03-02 | Discharge: 2009-03-02 | Payer: Self-pay | Admitting: Emergency Medicine

## 2009-03-29 IMAGING — CR DG CHEST 2V
2 series · 2 of 2 positions shown · non-contrast
Comparison: 02/17/07.

CLINICAL DATA: Cough, congestion and chest pain.  History of asthma and hypertension. 
 CHEST - 2 VIEW:

[w chest pa]
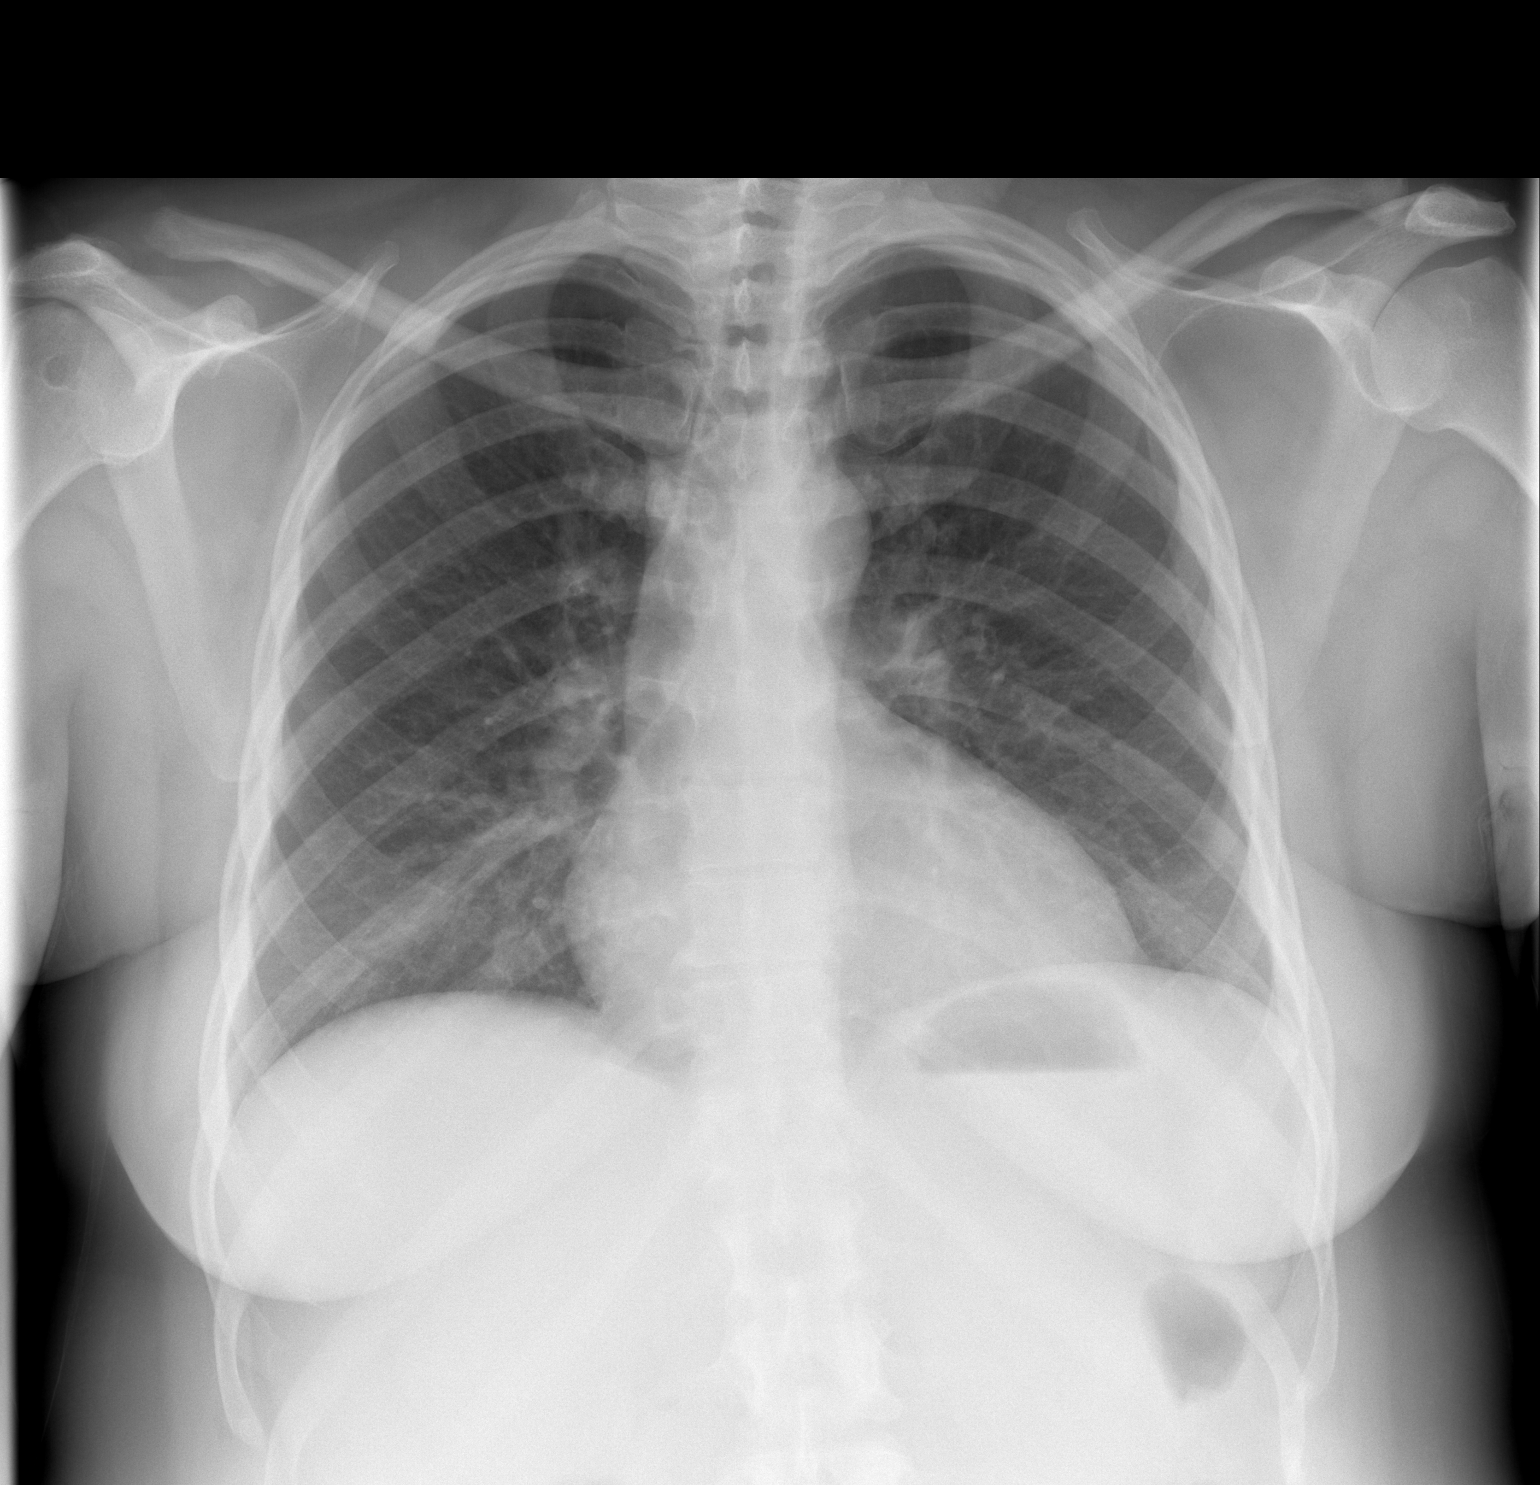

[w chest lat]
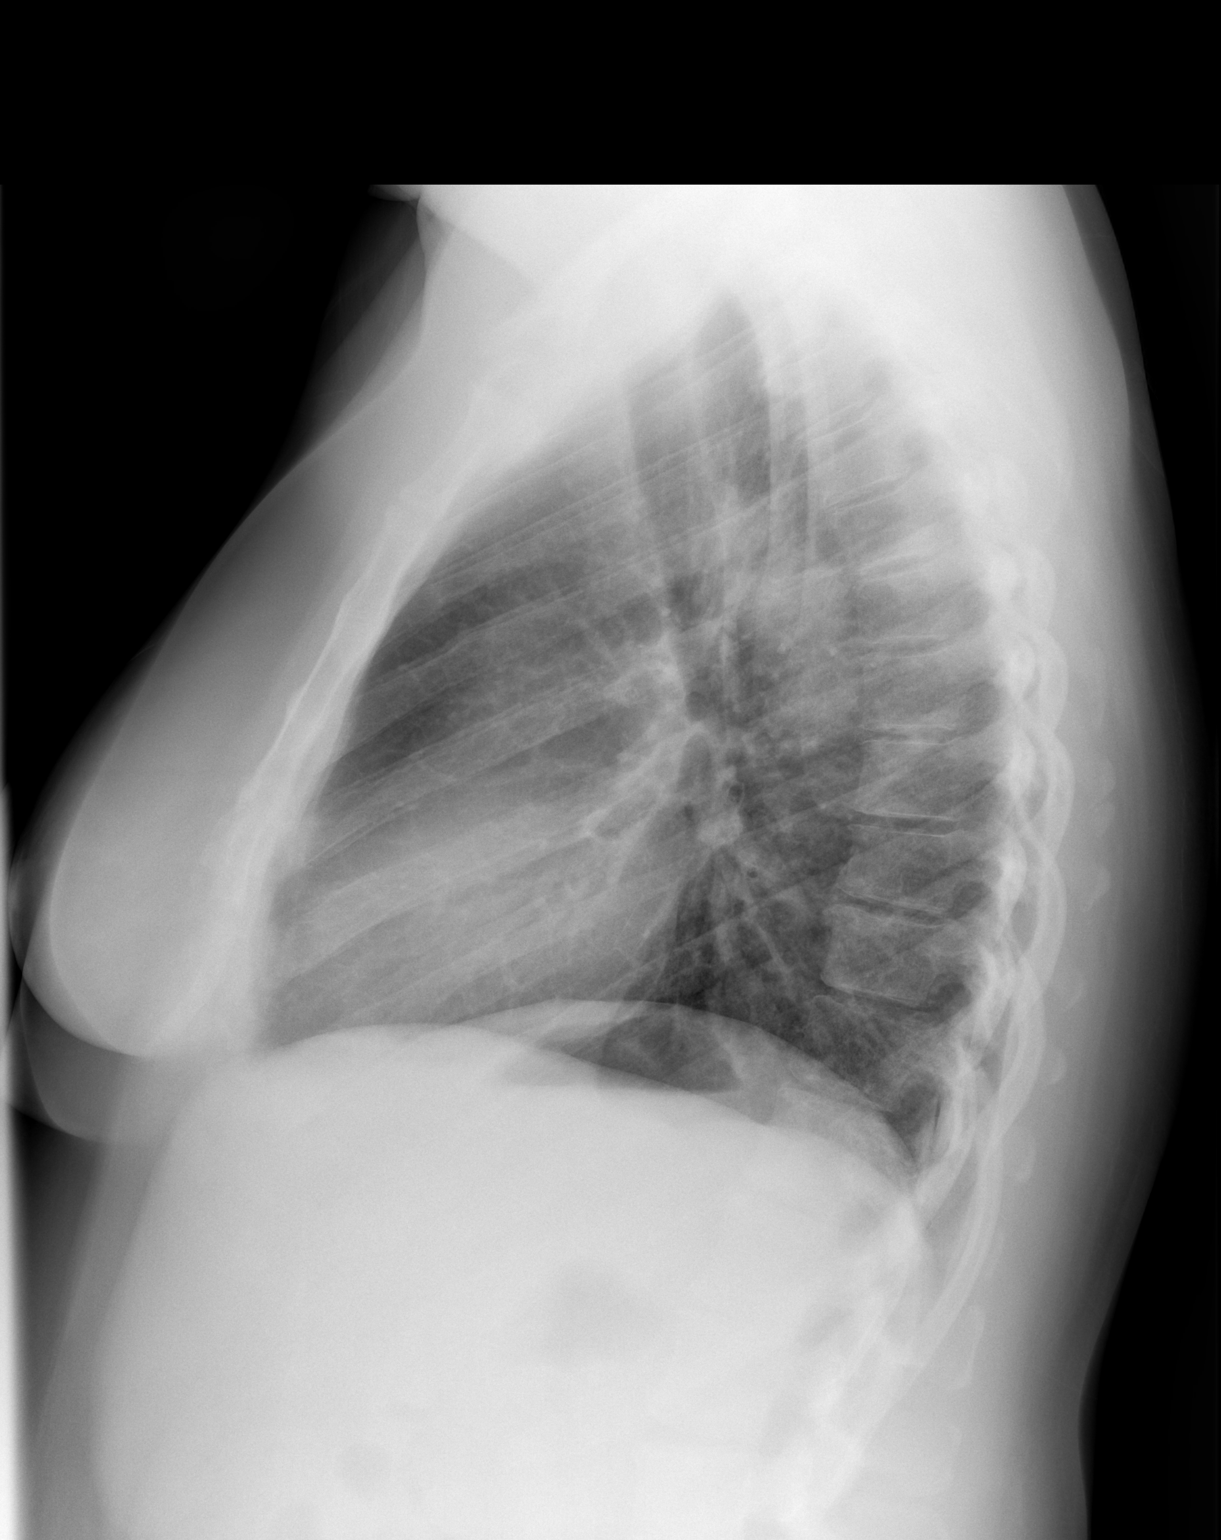

[2 of 2 positions shown; findings below may reference images not displayed]

FINDINGS: Minimal peribronchial thickening compatible with chronic bronchitic changes.  No active infiltrate.  Normal cardiomediastinal silhouette size and contours.
IMPRESSION: Mild chronic bronchitic markings.

## 2009-05-08 IMAGING — CR DG CHEST 2V
2 series · 2 of 2 positions shown · non-contrast
Comparison: 02/23/07.

CLINICAL DATA: 36-year-old with trouble breathing.
 CHEST ? 2 VIEW:

[w chest pa]
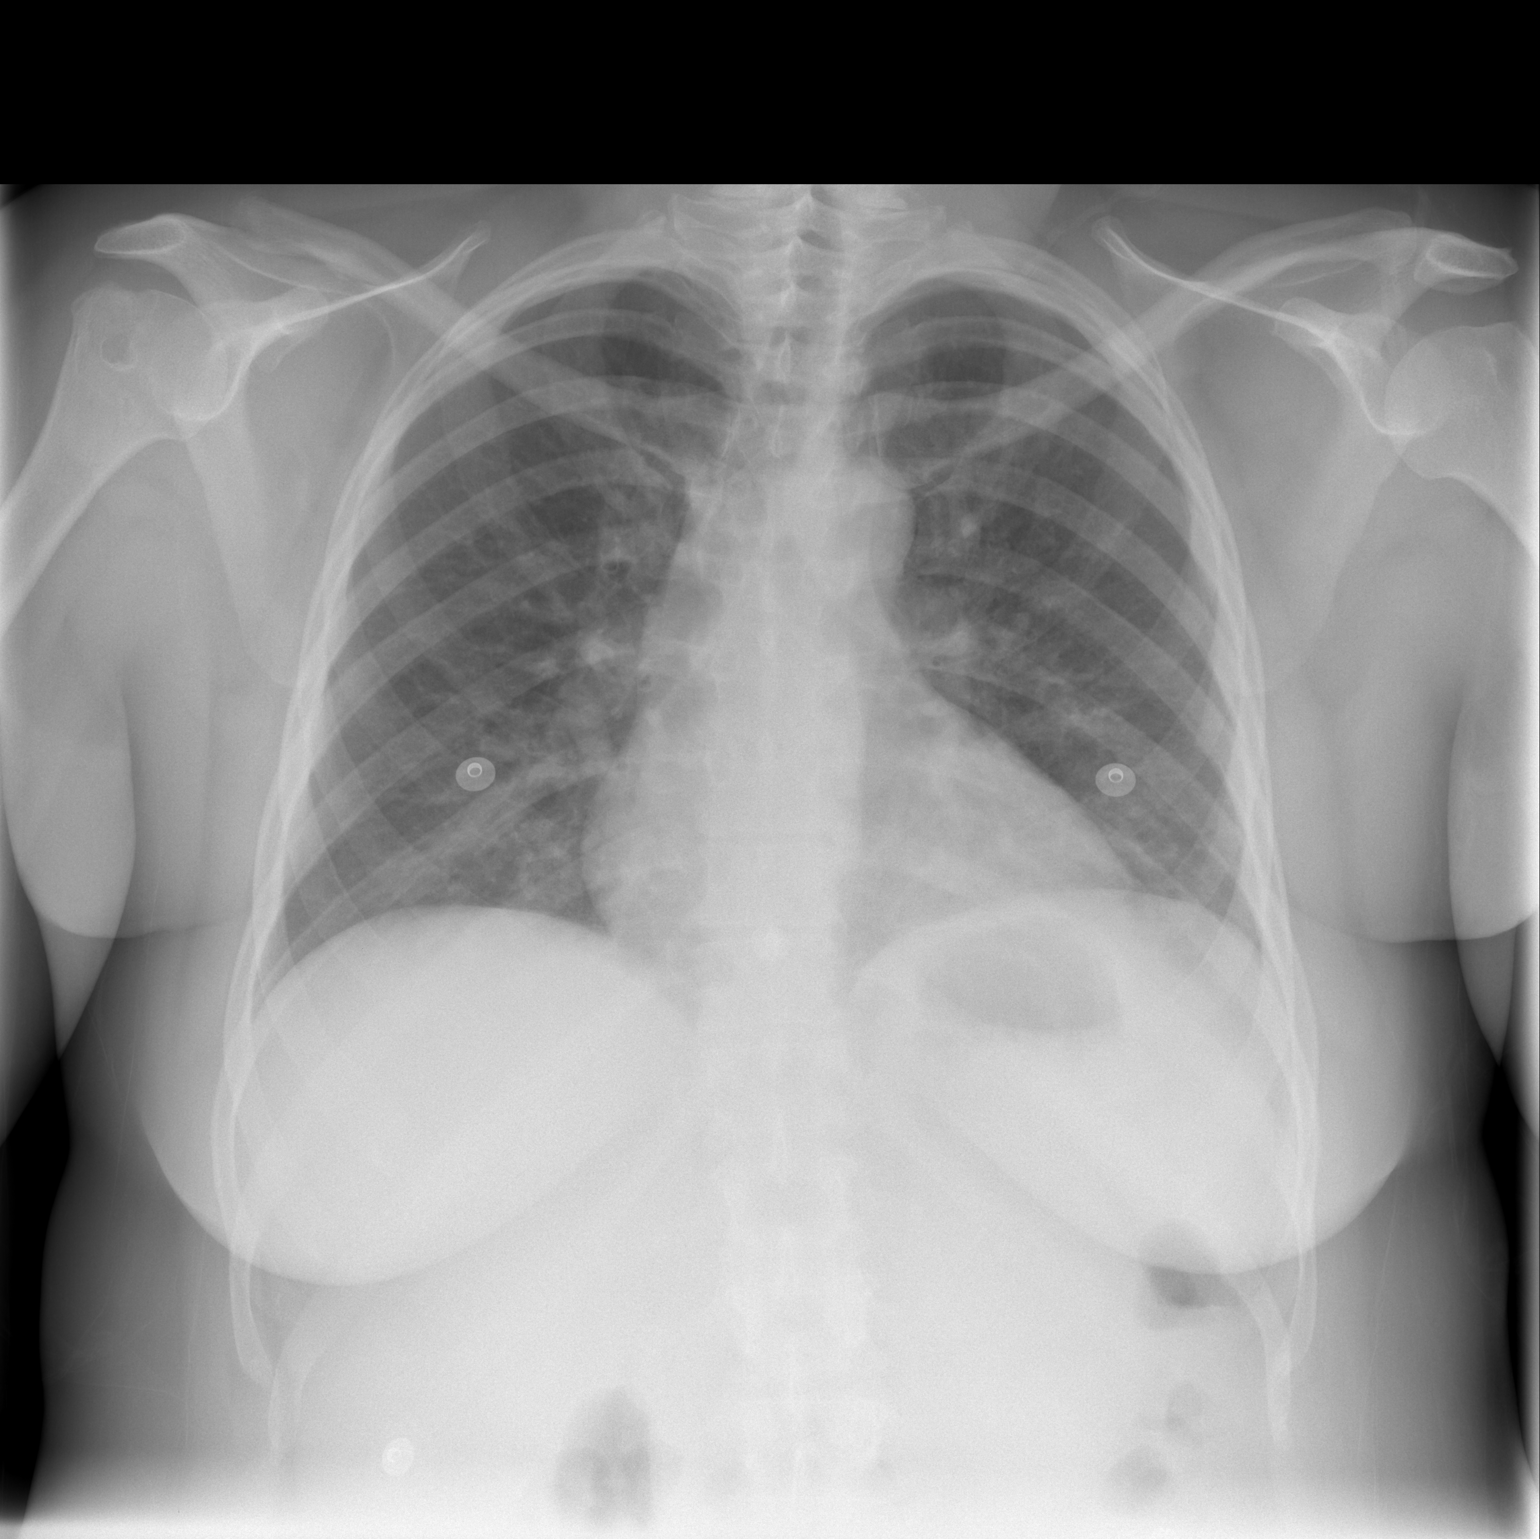

[w chest lat]
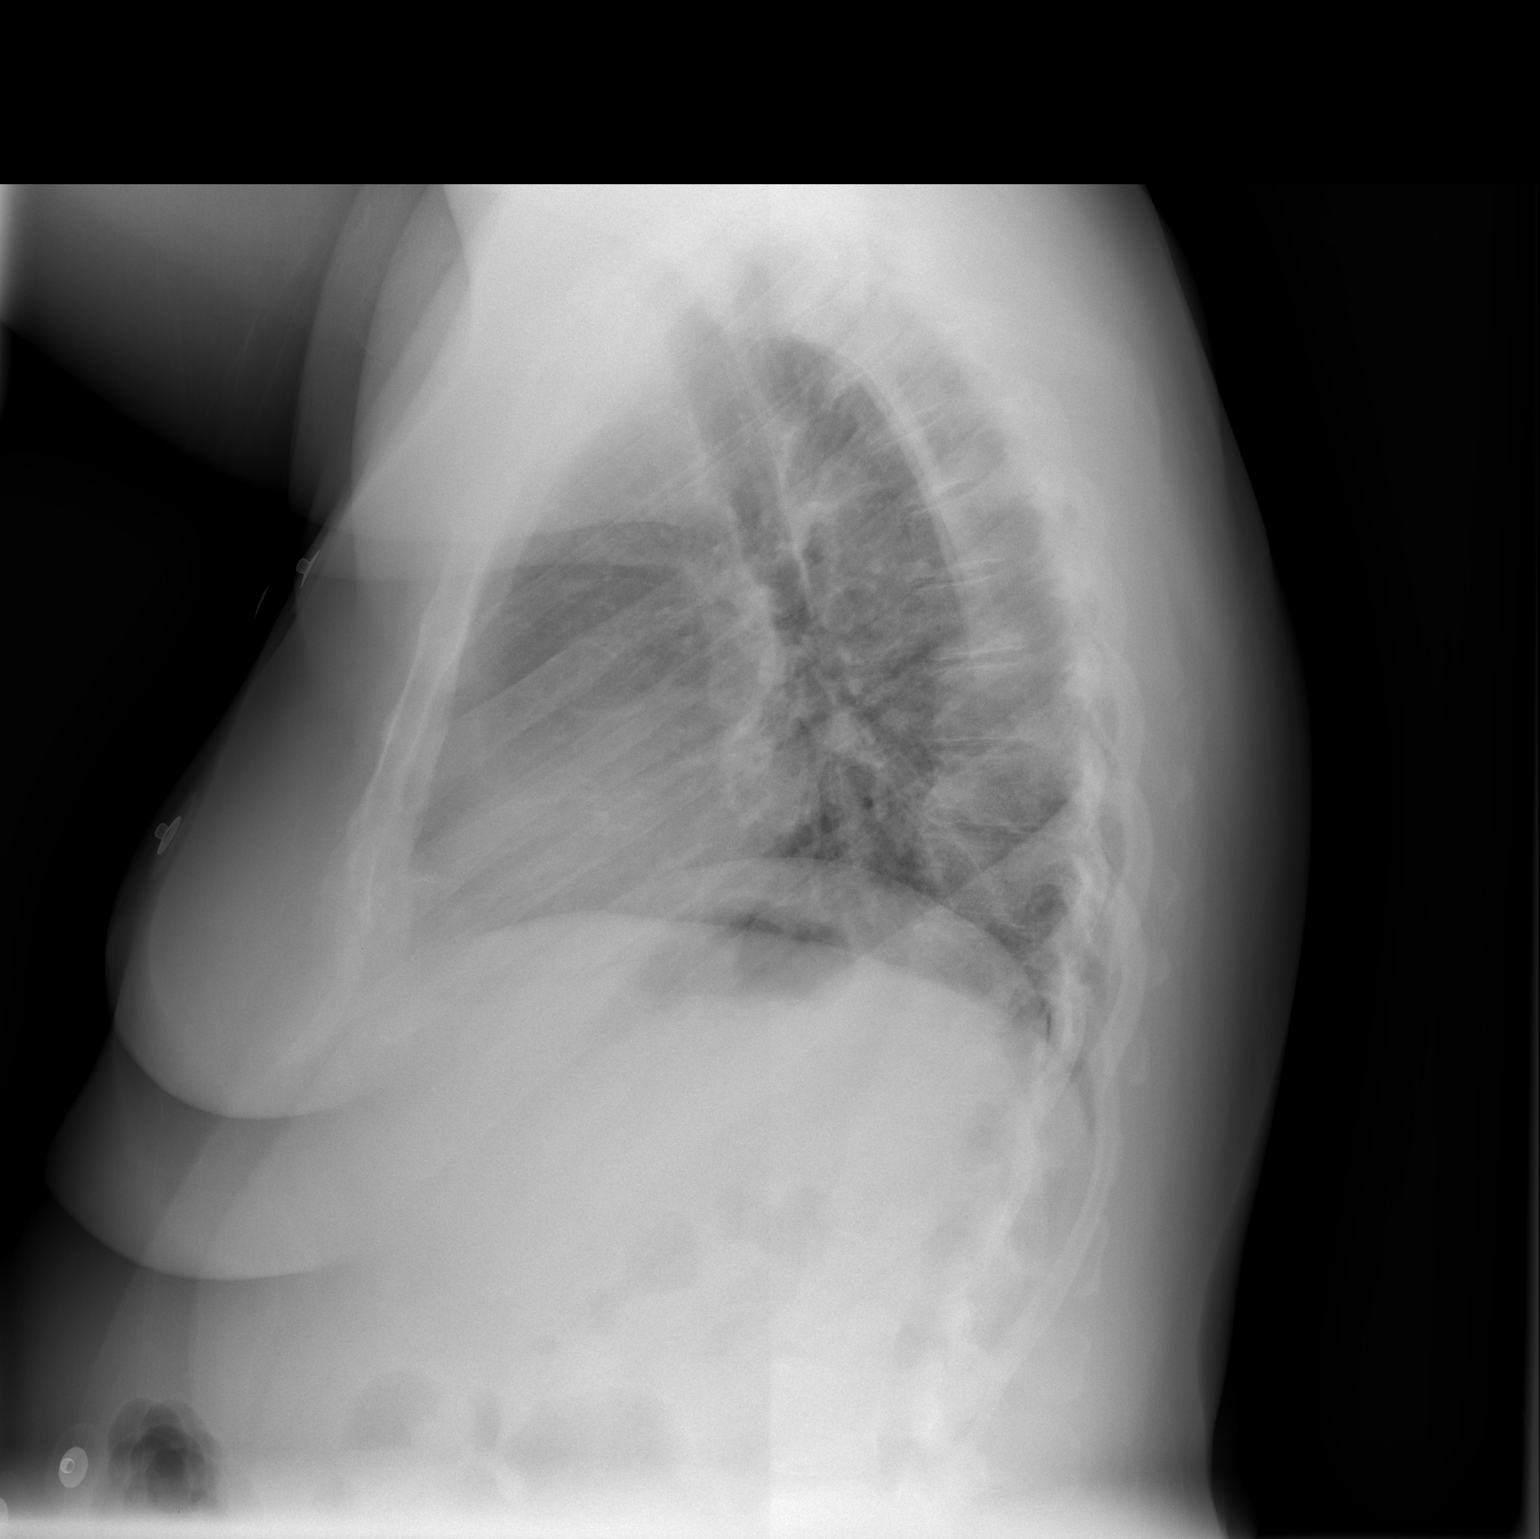

[2 of 2 positions shown; findings below may reference images not displayed]

FINDINGS: Two views of the chest demonstrate mild interstitial thickening.  No focal airspace disease.  The trachea is midline.  Heart and mediastinum are within normal limits.  Bone structures are intact.
IMPRESSION: Stable appearance of the lungs with prominent interstitial markings, likely chronic in nature.  No focal airspace disease.

## 2009-07-21 ENCOUNTER — Emergency Department (HOSPITAL_COMMUNITY): Admission: EM | Admit: 2009-07-21 | Discharge: 2009-07-21 | Payer: Self-pay | Admitting: Emergency Medicine

## 2009-07-31 ENCOUNTER — Inpatient Hospital Stay (HOSPITAL_COMMUNITY): Admission: EM | Admit: 2009-07-31 | Discharge: 2009-08-04 | Payer: Self-pay | Admitting: Emergency Medicine

## 2009-08-16 IMAGING — CR DG FINGER LITTLE 2+V*L*
3 series · 3 of 3 positions shown · non-contrast
Comparison: none

CLINICAL DATA: Injury to the finger.

LEFT SMALL FINGER - 3 VIEW

[x finger pa left]
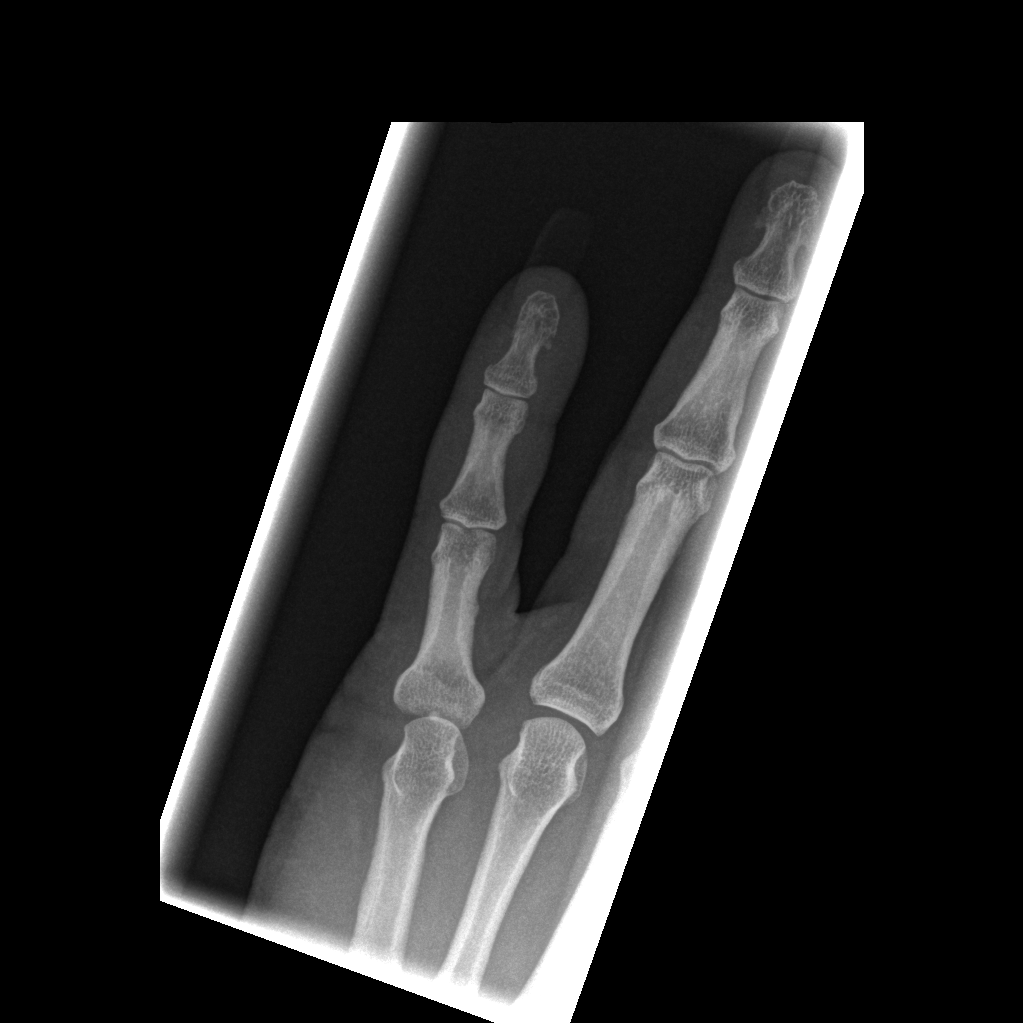

[x finger obl. left]
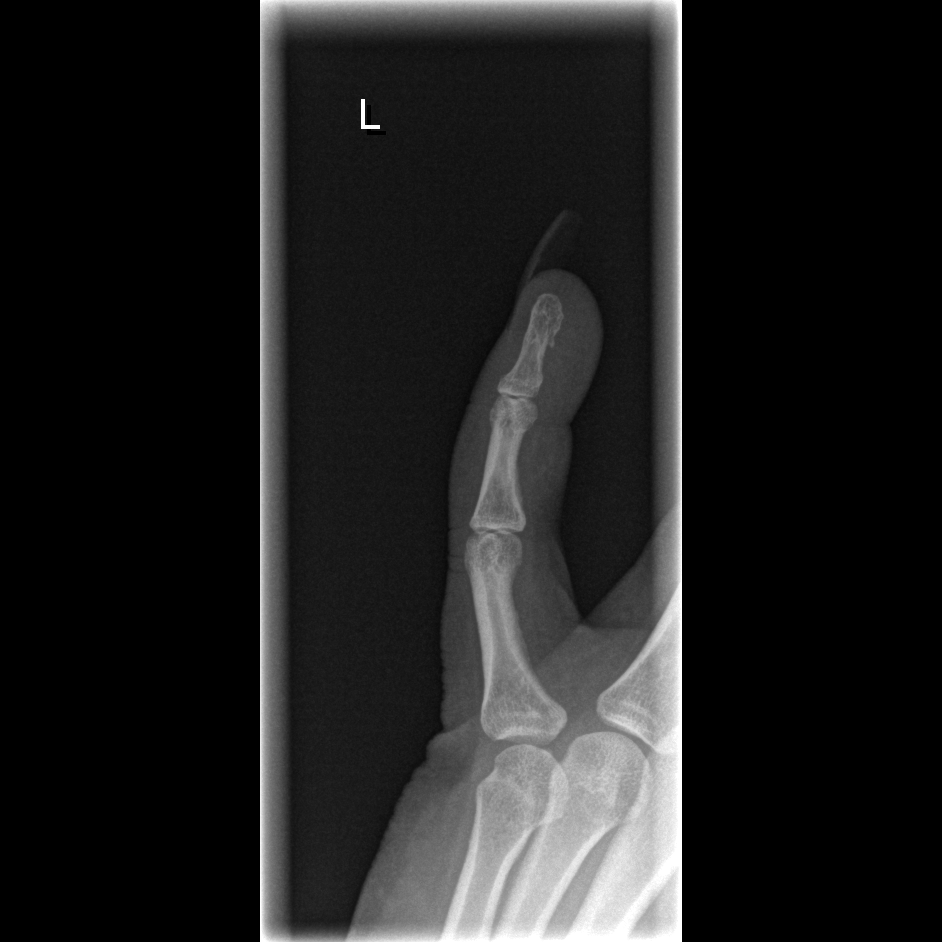

[x finger lateral left]
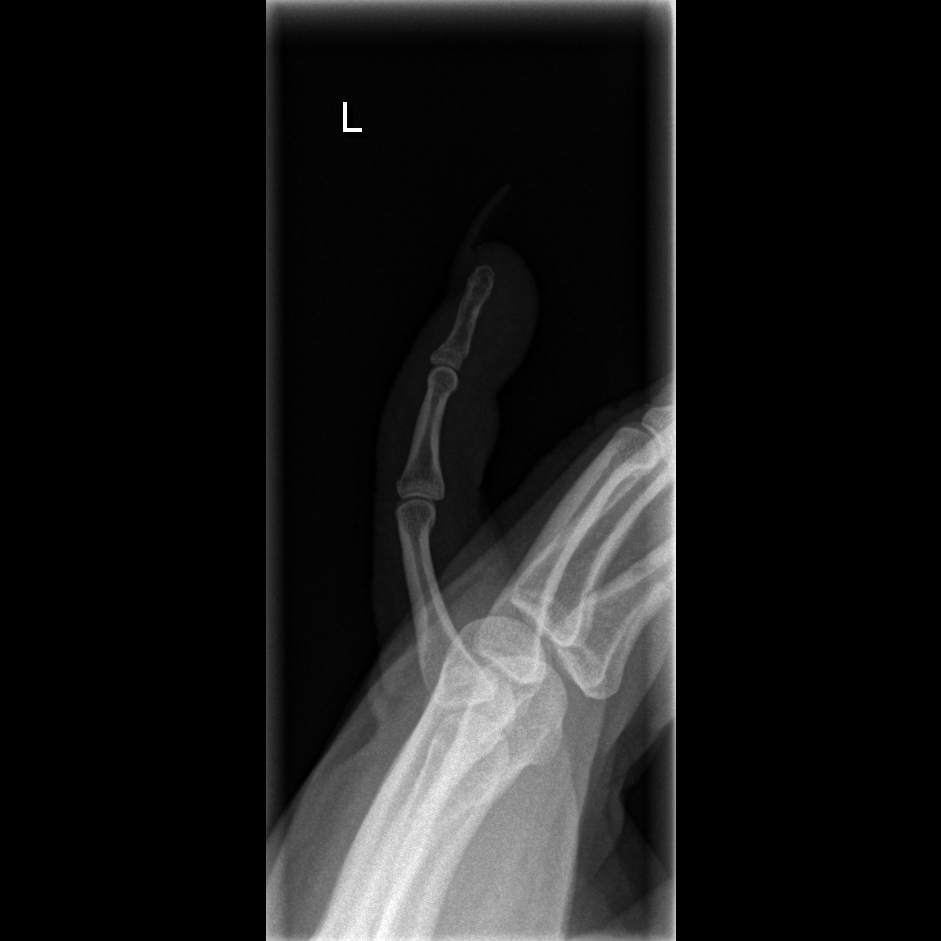

[3 of 3 positions shown; findings below may reference images not displayed]

FINDINGS: No fracture or dislocation is identified. No foreign bodies noted.

IMPRESSION

1. No acute bony findings.

## 2009-08-17 ENCOUNTER — Emergency Department (HOSPITAL_COMMUNITY): Admission: EM | Admit: 2009-08-17 | Discharge: 2009-08-17 | Payer: Self-pay | Admitting: Emergency Medicine

## 2009-09-16 ENCOUNTER — Emergency Department (HOSPITAL_COMMUNITY): Admission: EM | Admit: 2009-09-16 | Discharge: 2009-09-16 | Payer: Self-pay | Admitting: Emergency Medicine

## 2009-11-06 IMAGING — US US ABDOMEN COMPLETE
1 series · 14 of 25 positions shown · non-contrast
Comparison: none

HISTORY: Abdominal pain, vomiting

ULTRASOUND ABDOMEN COMPLETE:
TECHNIQUE: Complete abdominal ultrasound examination was performed including
evaluation of the liver, gallbladder, bile ducts, pancreas, kidneys, spleen,
IVC, and abdominal aorta.

[Series 1: us abdomen complete · 14 of 76 slices shown]
[im 1/76]
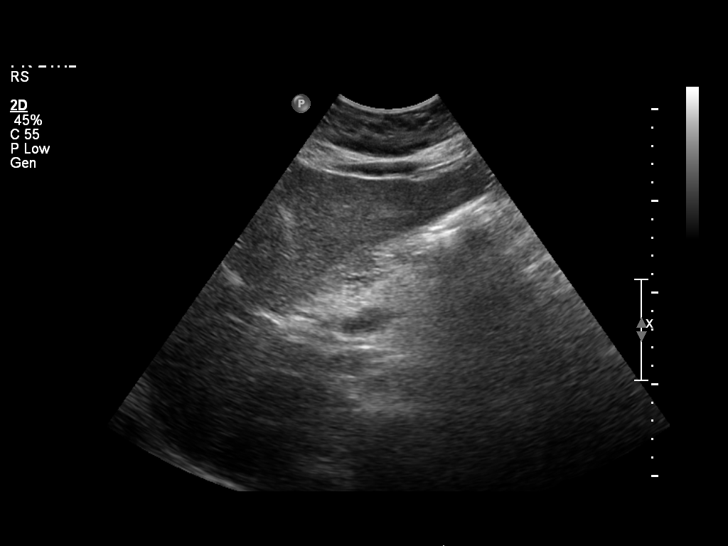
[im 7/76]
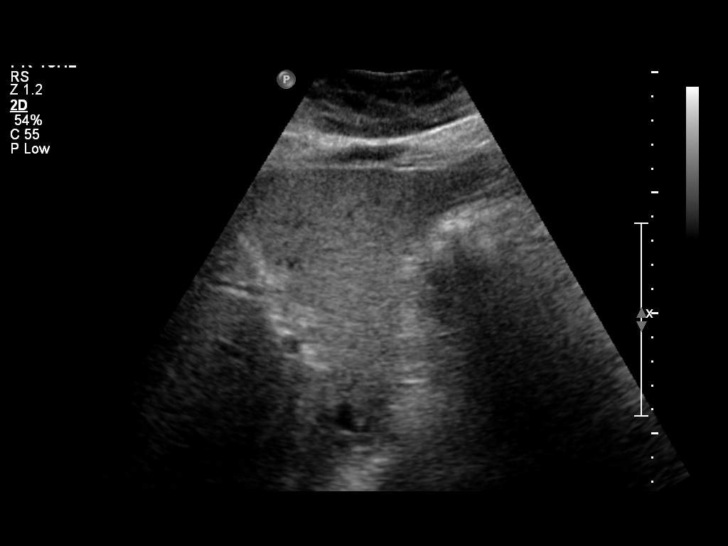
[im 13/76]
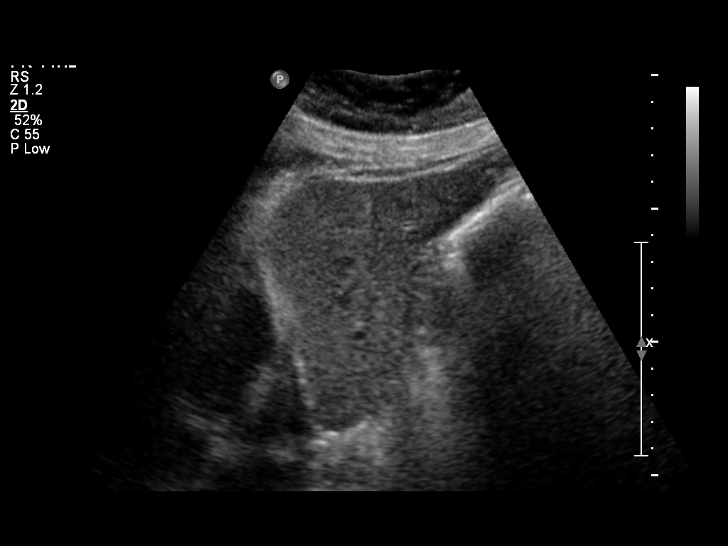
[im 19/76]
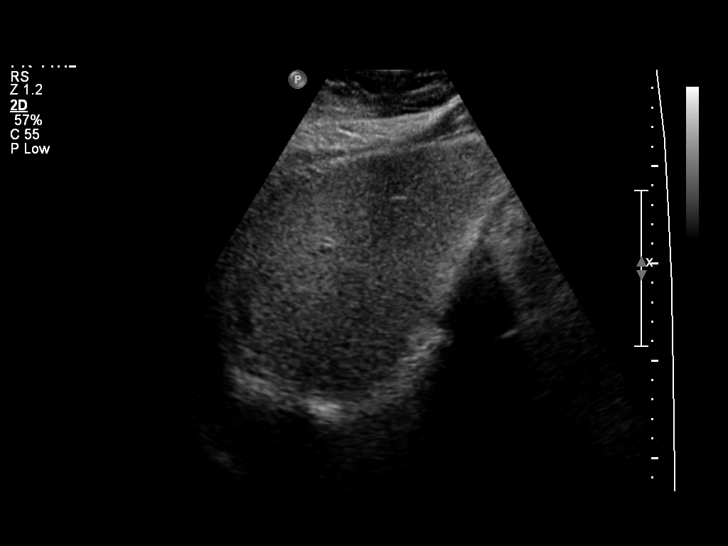
[im 26/76]
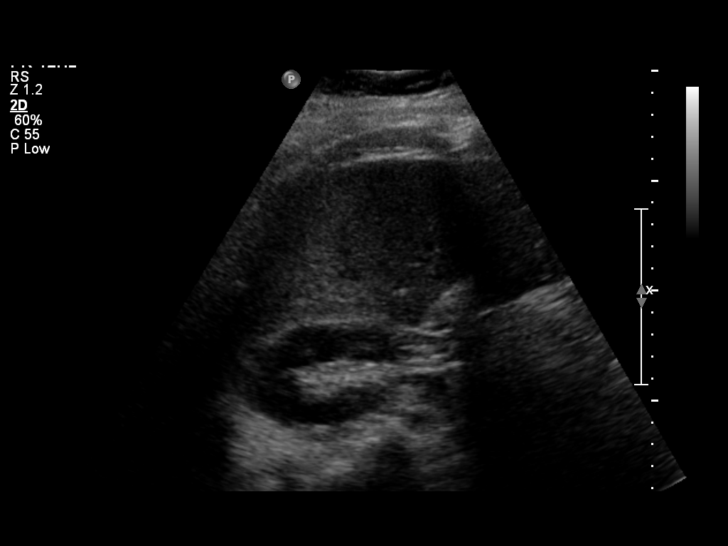
[im 29/76]
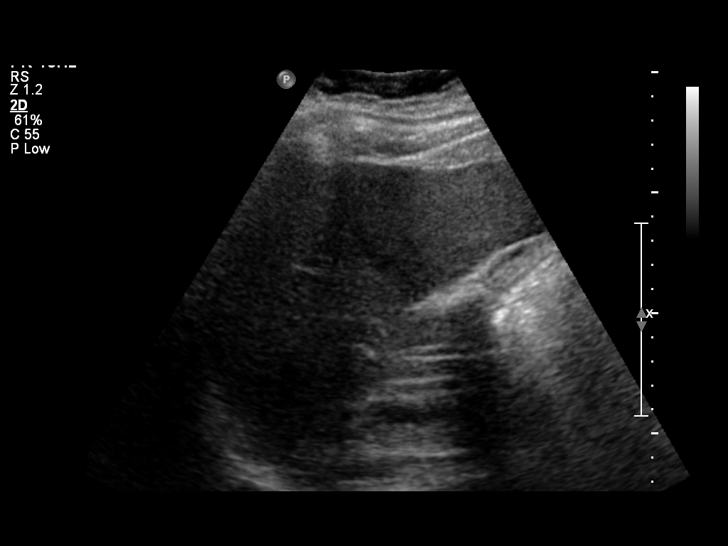
[im 35/76]
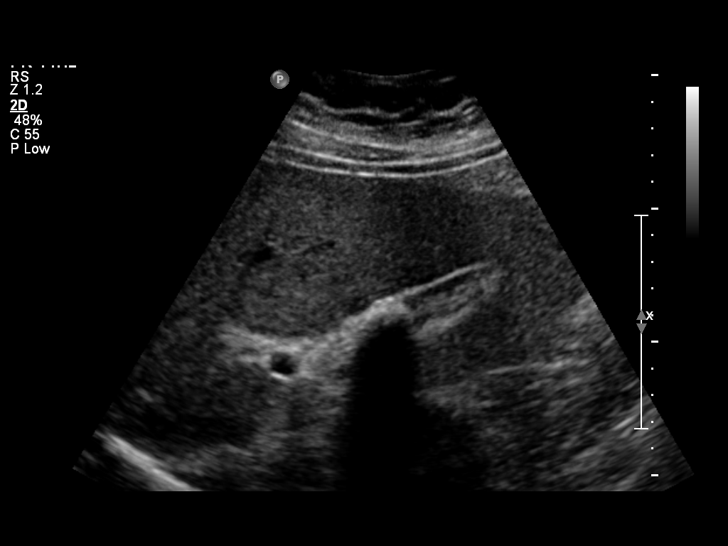
[im 41/76]
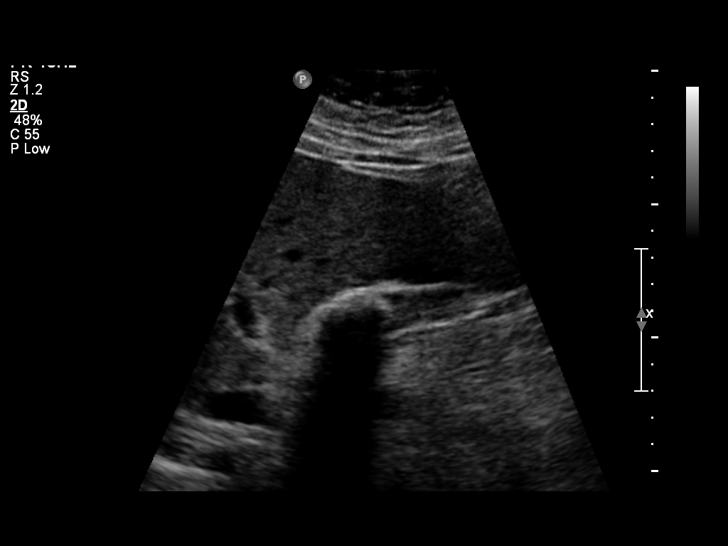
[im 47/76]
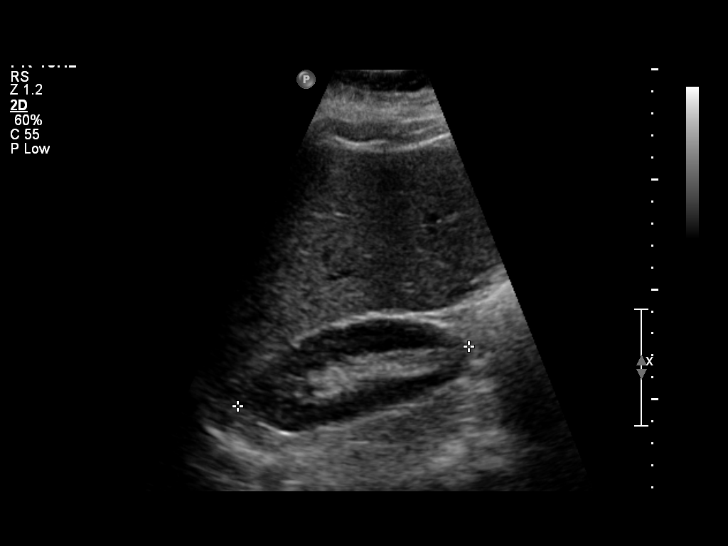
[im 51/76]
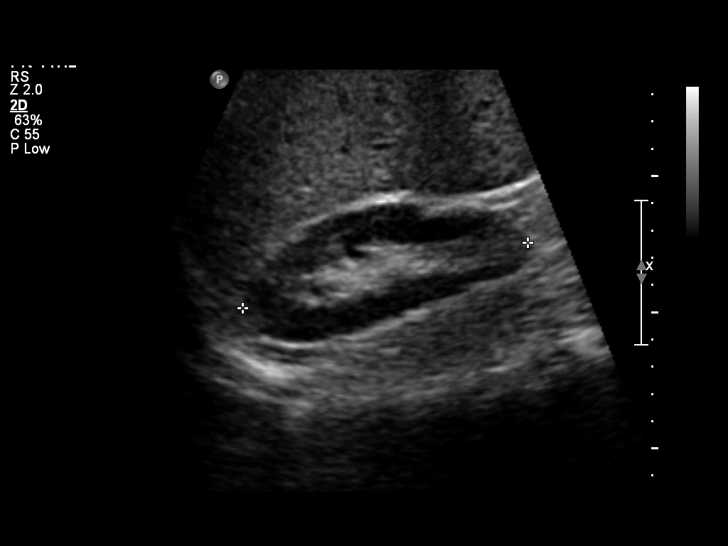
[im 57/76]
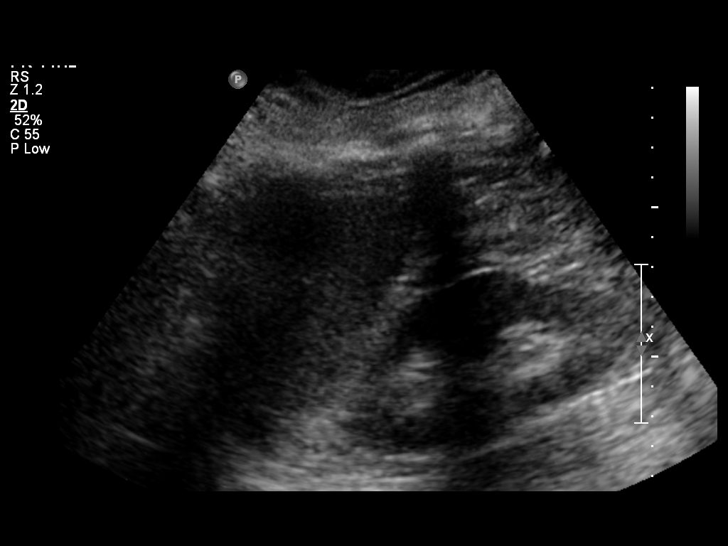
[im 63/76]
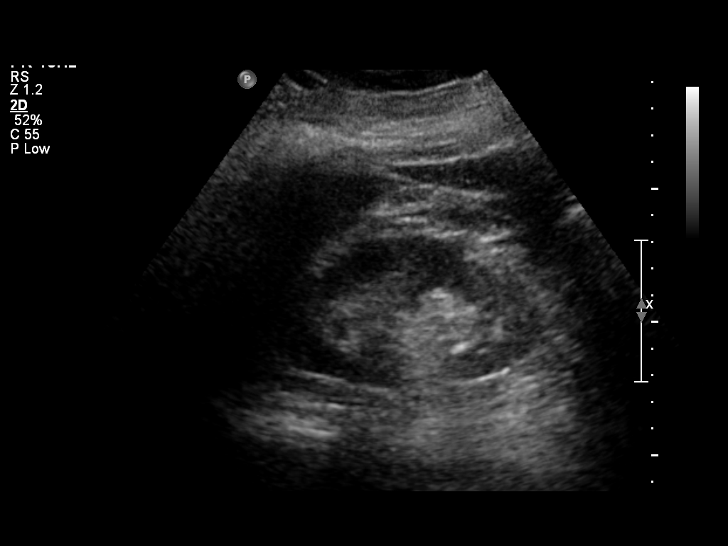
[im 69/76]
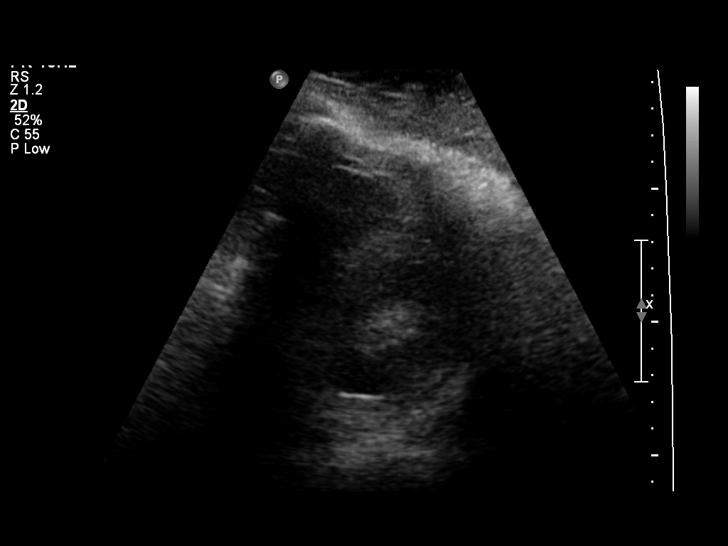
[im 76/76]
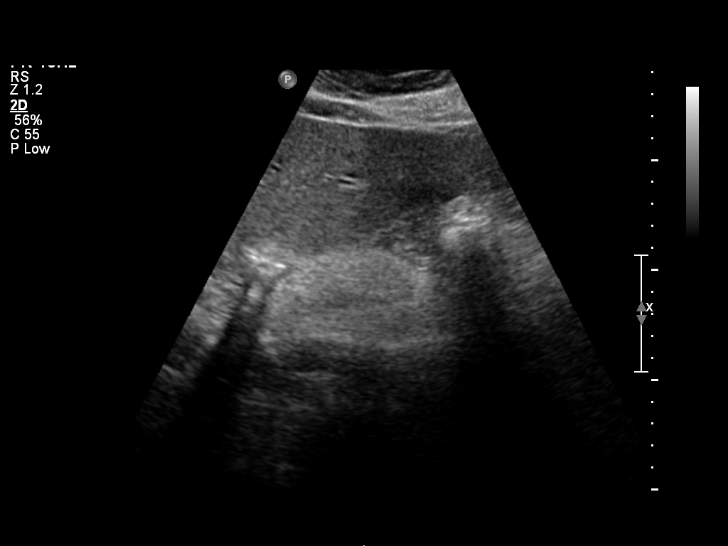

[14 of 25 positions shown; findings below may reference images not displayed]

Large shadowing gallstone within gallbladder.
No gallbladder wall thickening or pericholecystic fluid.
Small amount of associated gallbladder sludge.
Common bile duct normal caliber 4 mm diameter.
Pancreas poorly visualized due to bowel gas.
Liver and spleen unremarkable, spleen 9.7 cm length.
Kidneys normal size and morphology, 10.8 cm length right and 10.4 cm length
left.
Visualized portions of aorta normal appearance with distal aorta poorly seen due
to bowel gas.
IVC unremarkable.
No free fluid.
IMPRESSION: Cholelithiasis with additional sludge in gallbladder.
No definite evidence of acute cholecystitis or other upper abdominal process.
Suboptimal visualization of pancreas and distal aorta.

## 2009-11-16 ENCOUNTER — Emergency Department (HOSPITAL_COMMUNITY): Admission: EM | Admit: 2009-11-16 | Discharge: 2009-11-16 | Payer: Self-pay | Admitting: Emergency Medicine

## 2009-11-30 ENCOUNTER — Emergency Department (HOSPITAL_COMMUNITY): Admission: EM | Admit: 2009-11-30 | Discharge: 2009-11-30 | Payer: Self-pay | Admitting: Emergency Medicine

## 2009-12-13 IMAGING — RF DG CHOLANGIOGRAM OPERATIVE
1 series · 7 of 7 positions shown · non-contrast
Comparison: none

CLINICAL DATA: Abdominal pain. Biliary colic.
 INTRAOPERATIVE CHOLANGIOGRAM ? 11/09/07:
TECHNIQUE: Multiple fluoroscopic radiographs were obtained during intraoperative cholangiogram and are submitted for interpretation post-operatively.

[Series 1: run · 2 acquisitions, 7 frames shown]
[im 1/2]
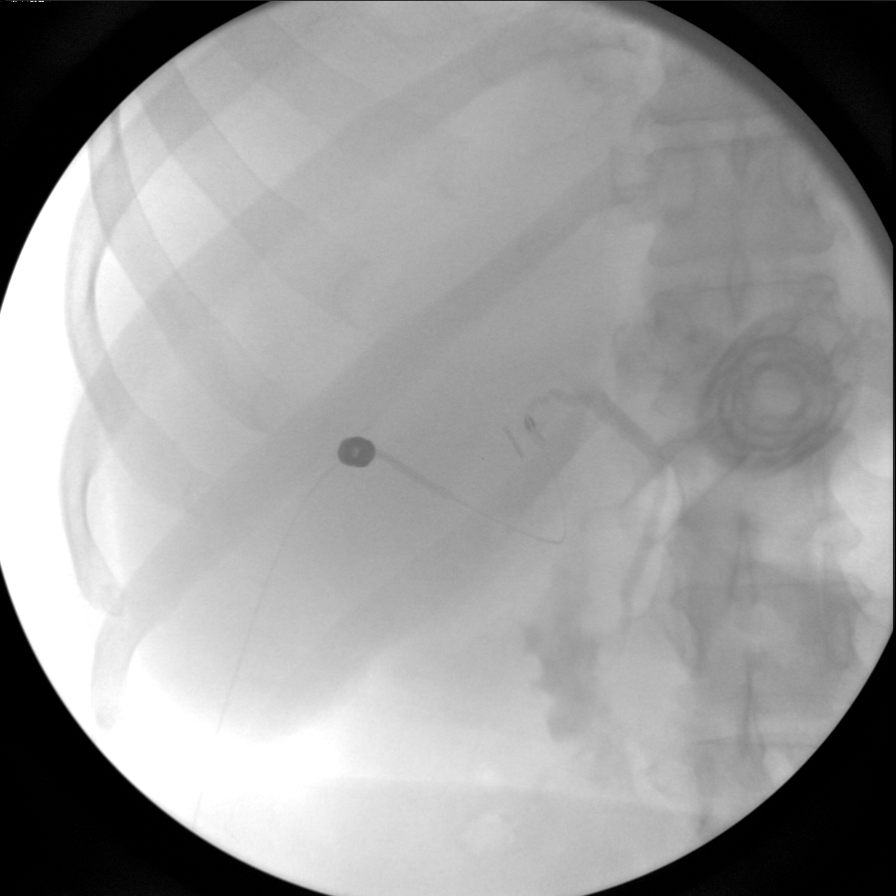
[im 1/2]
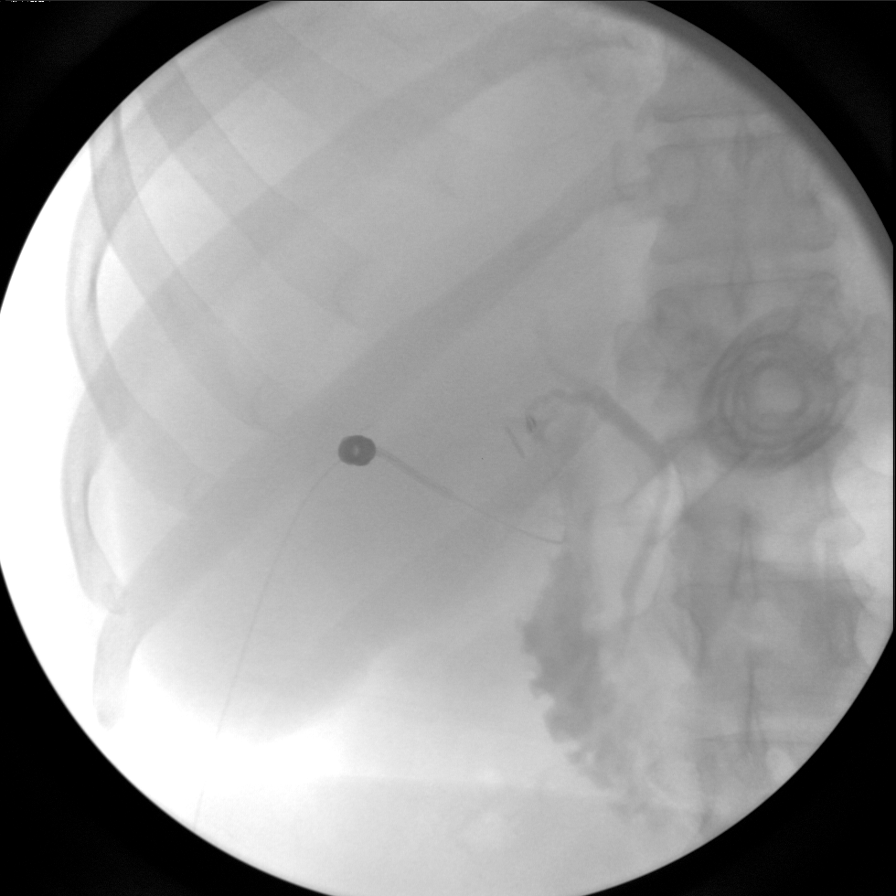
[im 1/2]
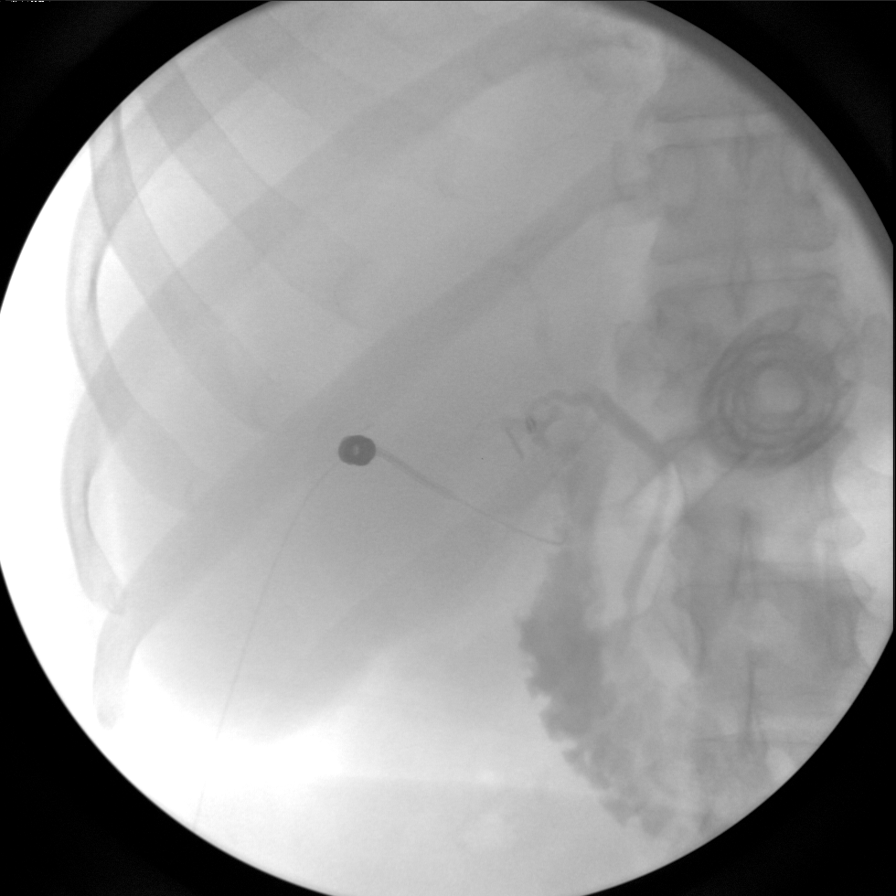
[im 1/2]
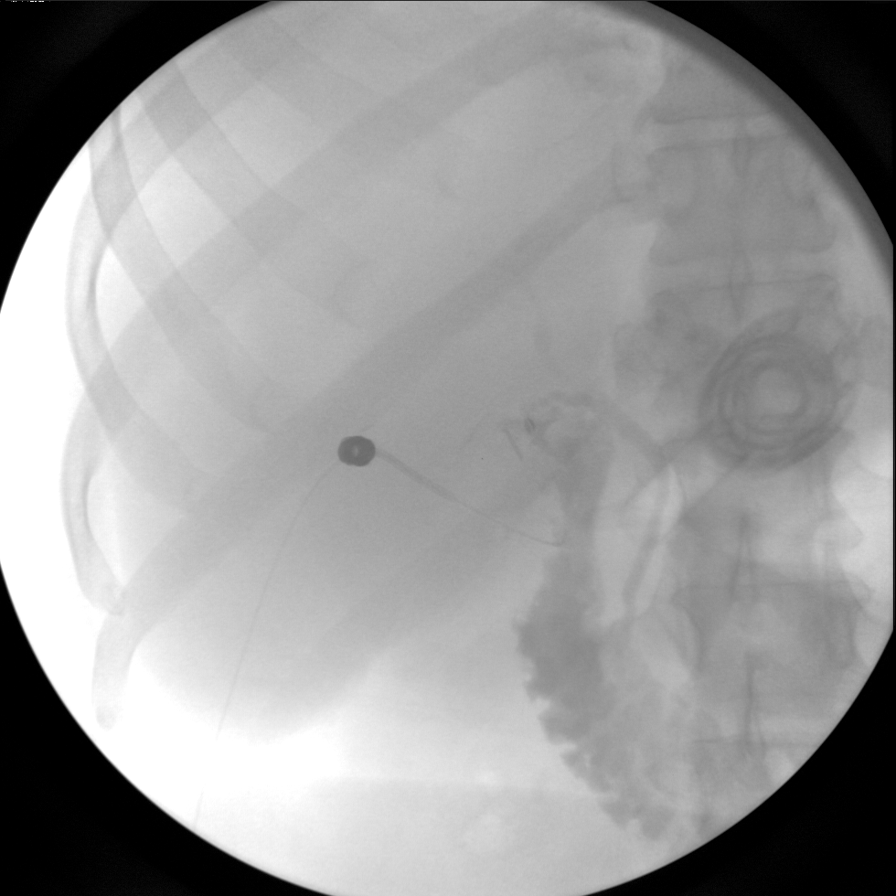
[im 2/2]
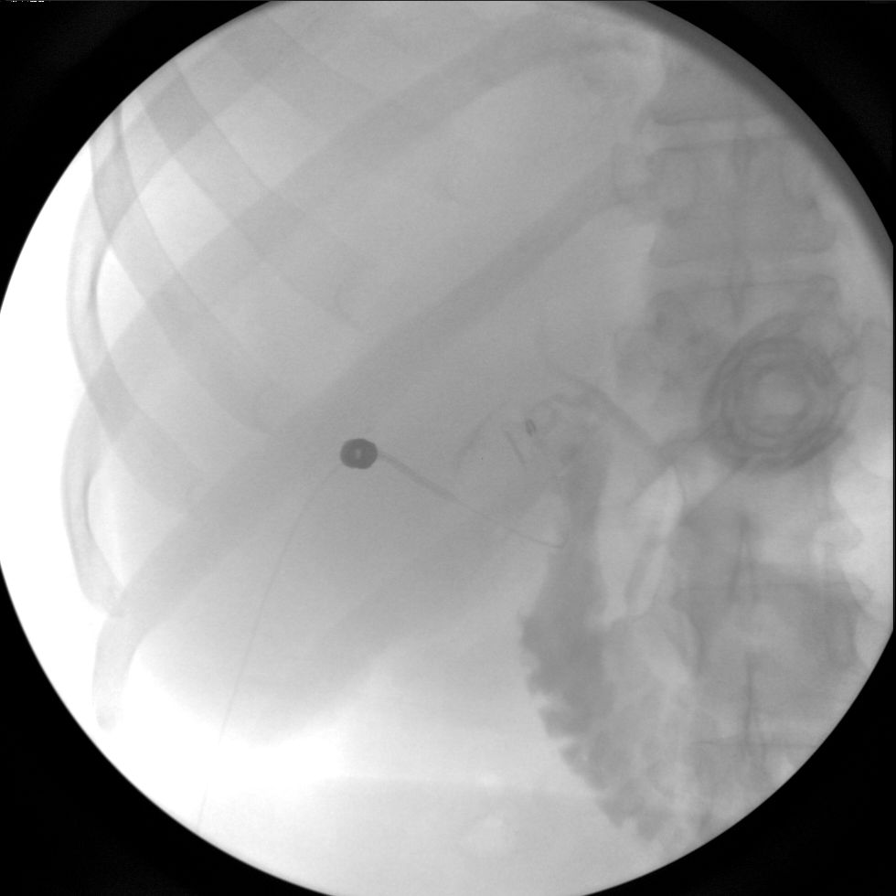
[im 2/2]
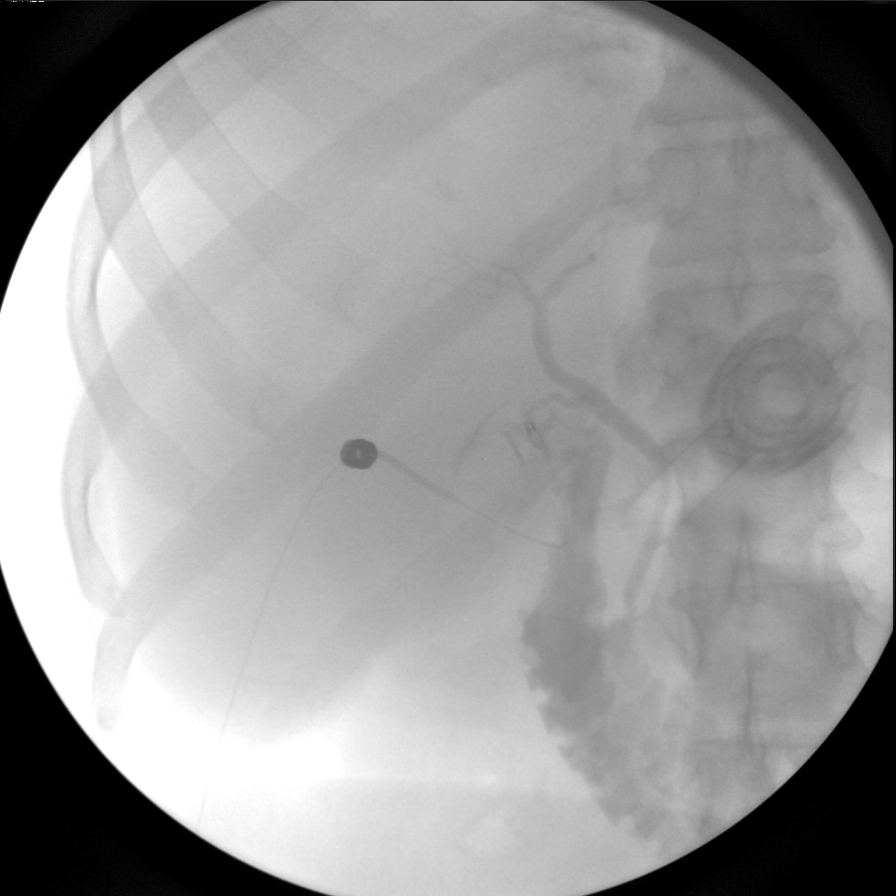
[im 2/2]
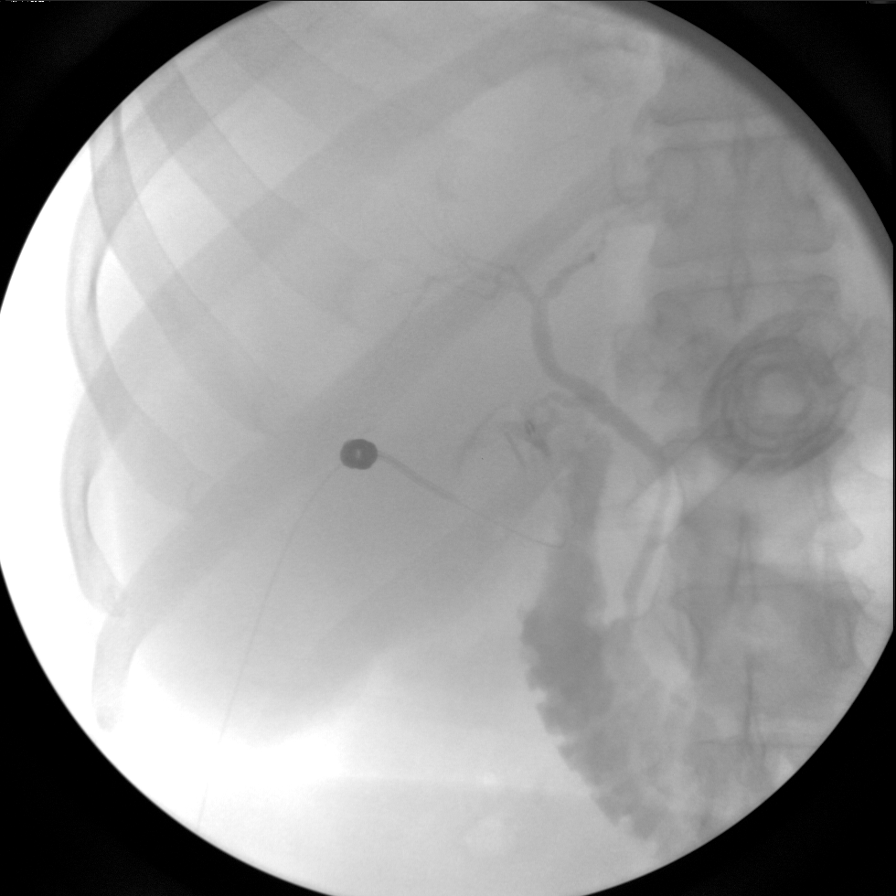

[7 of 7 positions shown; findings below may reference images not displayed]

FINDINGS: There is opacification of the common bile duct which does not appear dilated.  No filling defects.  The intrahepatic biliary tree does not appear dilated.
IMPRESSION: No acute findings.

## 2010-01-25 ENCOUNTER — Emergency Department (HOSPITAL_COMMUNITY): Admission: EM | Admit: 2010-01-25 | Discharge: 2010-01-25 | Payer: Self-pay | Admitting: Emergency Medicine

## 2010-04-28 ENCOUNTER — Emergency Department (HOSPITAL_COMMUNITY): Admission: EM | Admit: 2010-04-28 | Discharge: 2010-04-29 | Payer: Self-pay | Admitting: Emergency Medicine

## 2010-05-02 IMAGING — CR DG CHEST 2V
2 series · 2 of 2 positions shown · non-contrast
Comparison: 04/04/2007

CLINICAL DATA: left chest pain, back pain

CHEST - 2 VIEW

[w chest pa]
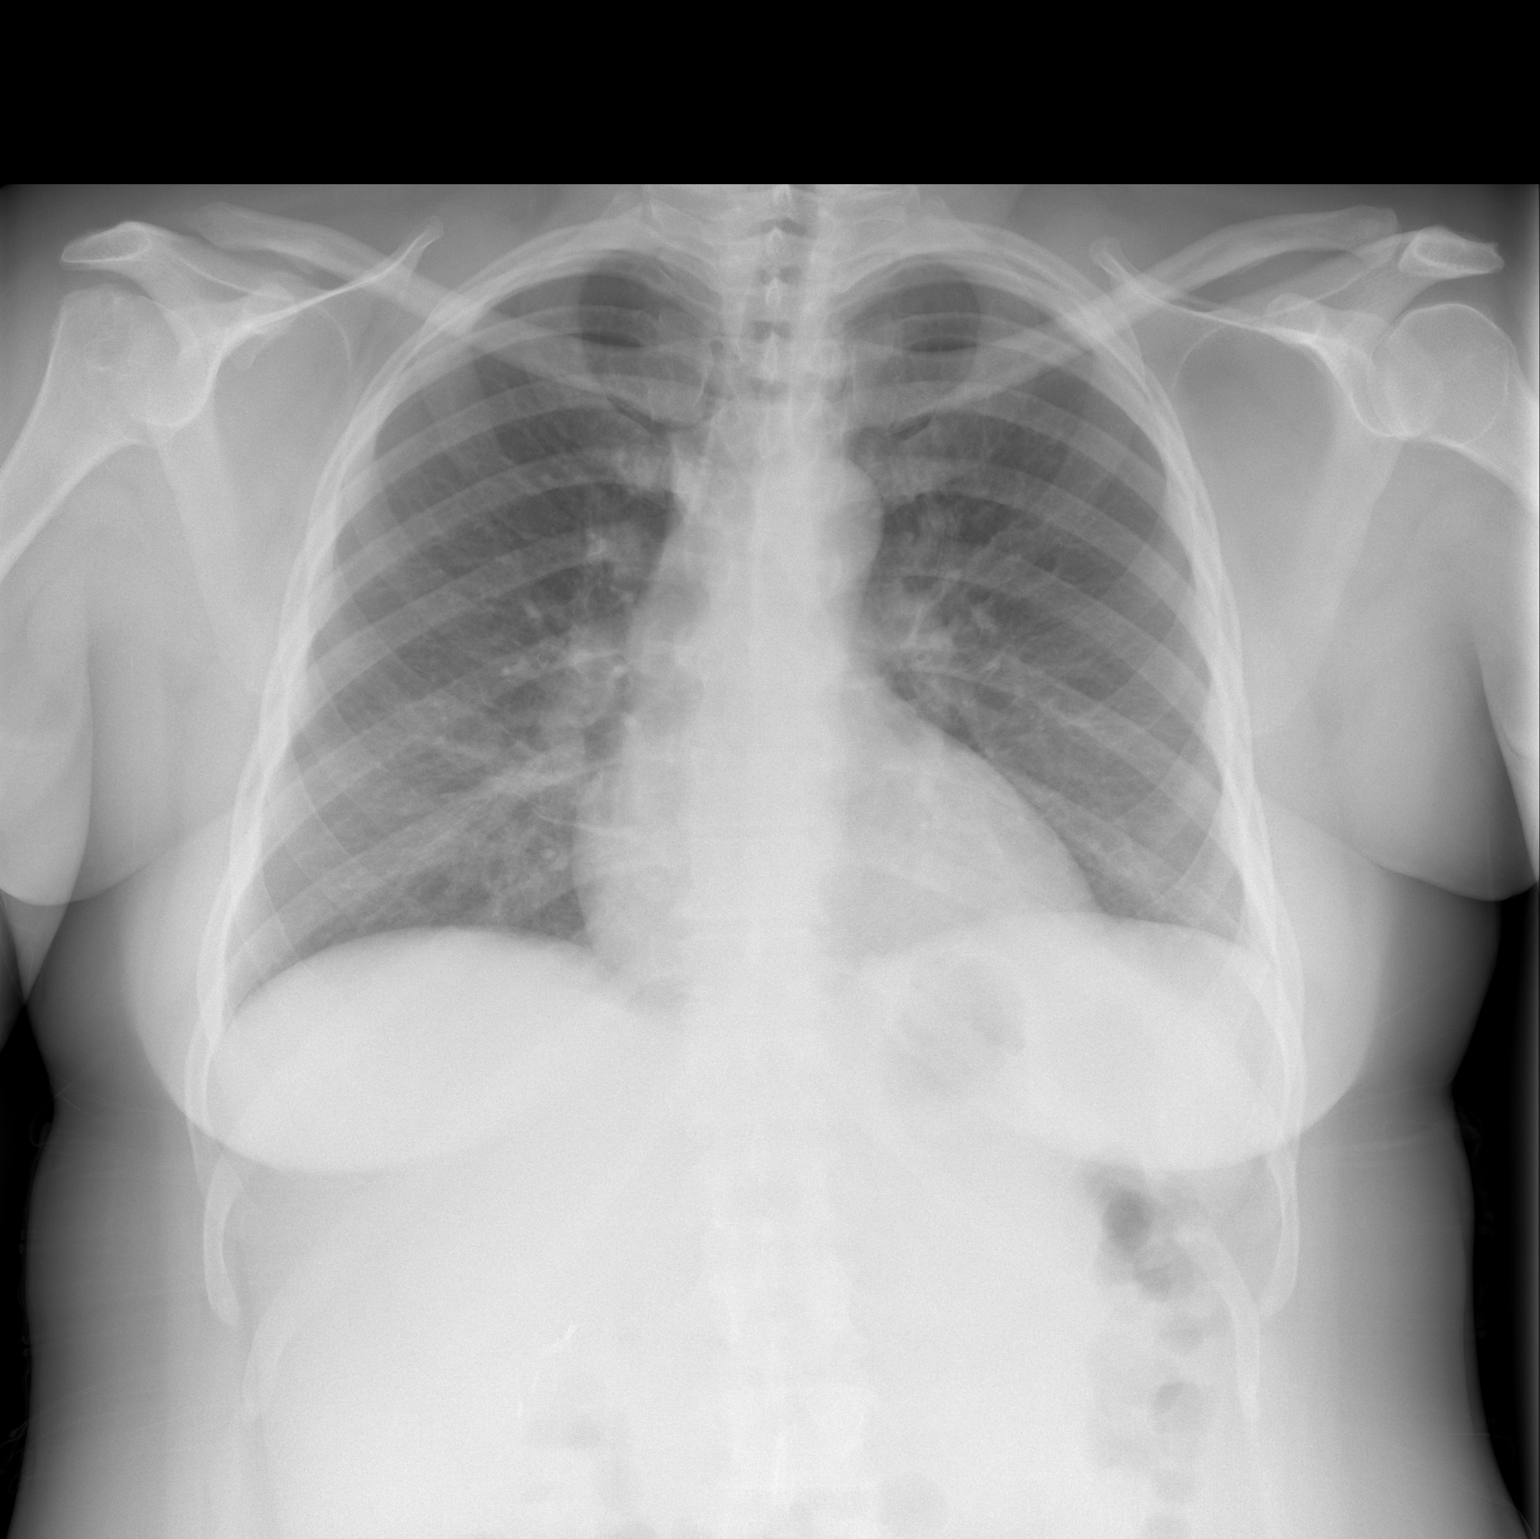

[w chest lat]
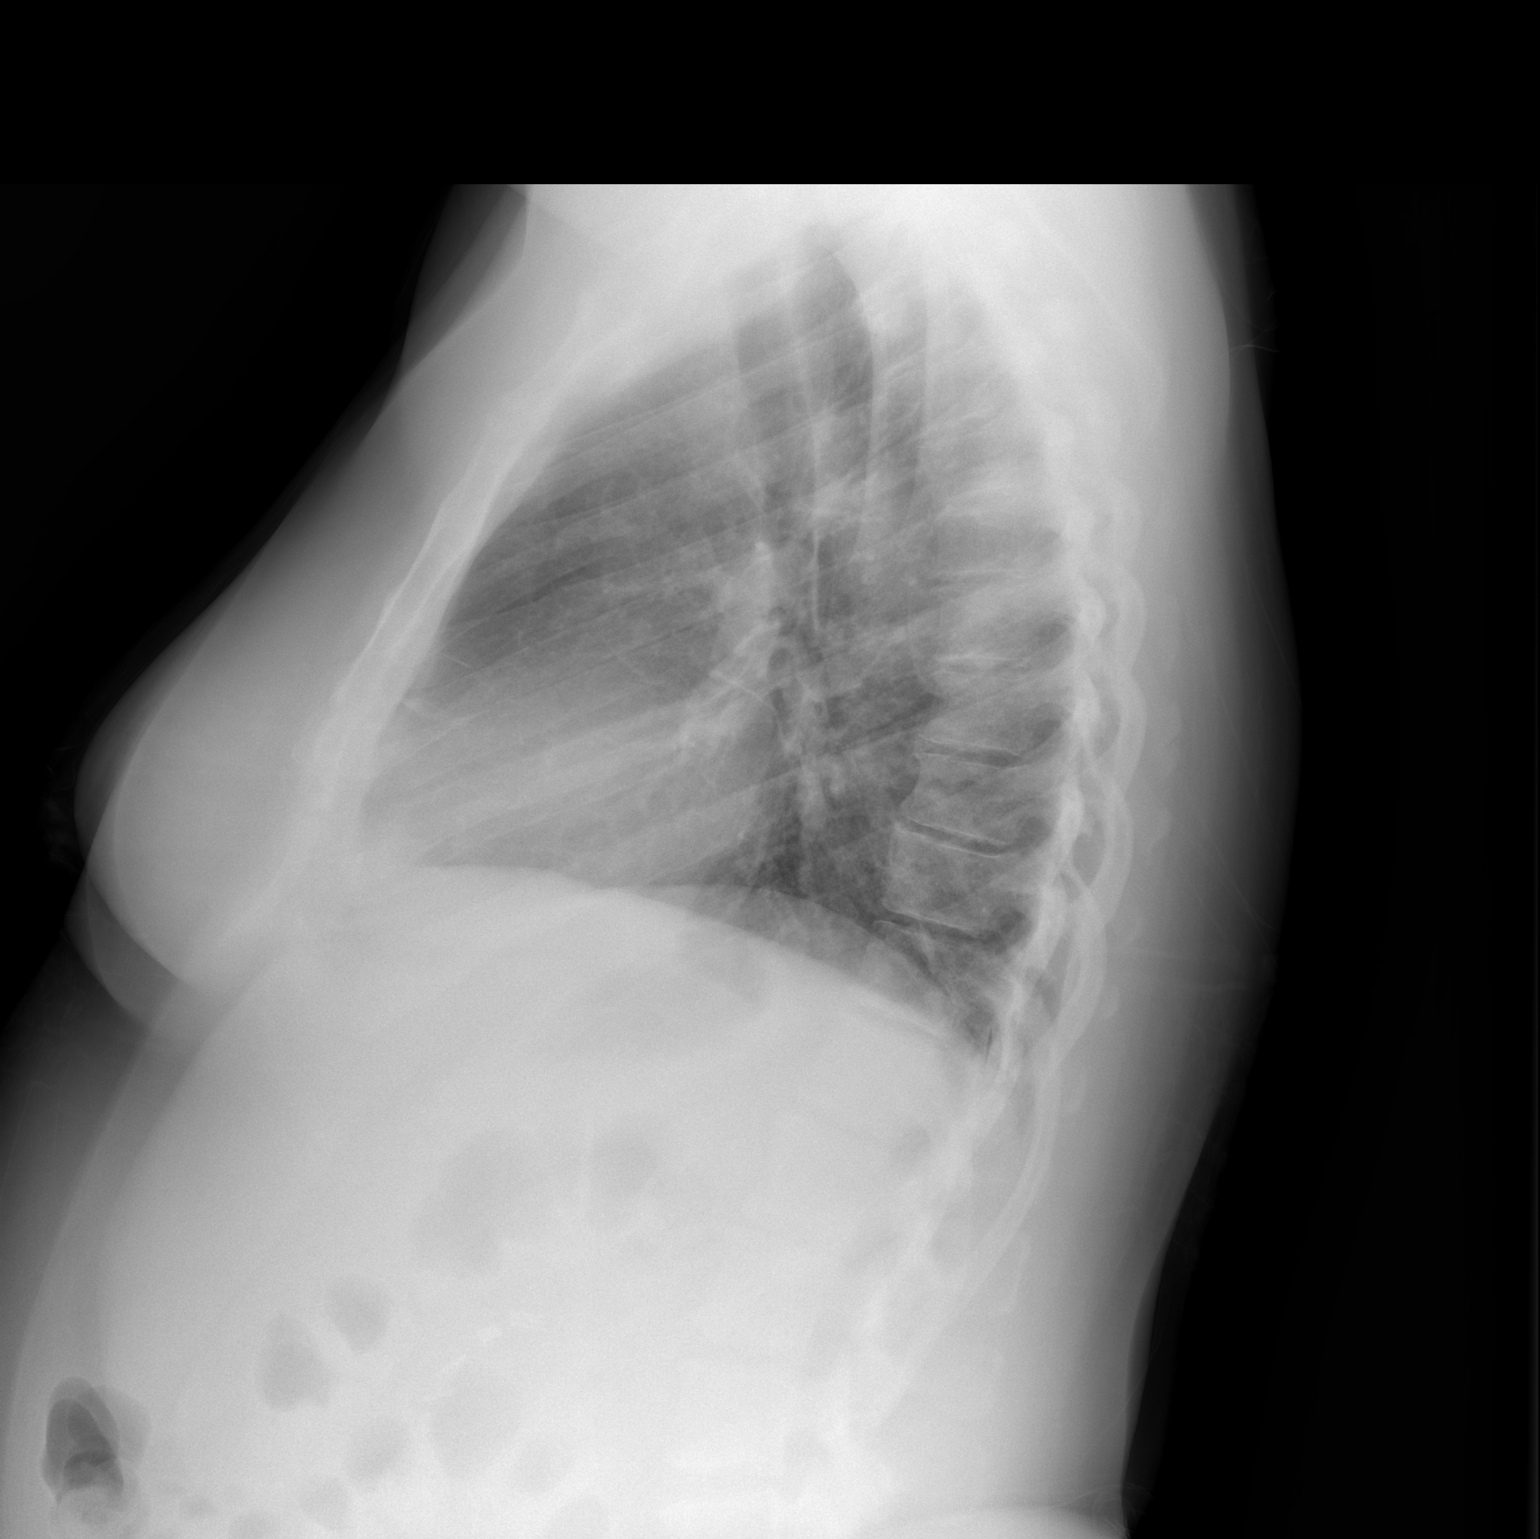

[2 of 2 positions shown; findings below may reference images not displayed]

FINDINGS: The cardiomediastinal silhouette is stable.  Mild
degenerative changes are seen mid thoracic spine.  No acute
infiltrate or pleural effusion.  Chronic mild interstitial
prominence.  No pulmonary edema.  Post cholecystectomy surgical
clips are noted.
IMPRESSION: No acute infiltrate or edema.  Chronic mild interstitial
prominence.

## 2010-05-08 IMAGING — CR DG FINGER LITTLE 2+V*R*
3 series · 3 of 3 positions shown · non-contrast
Comparison: None

CLINICAL DATA: Injury

RIGHT LITTLE FINGER 2+V

[x finger pa right]
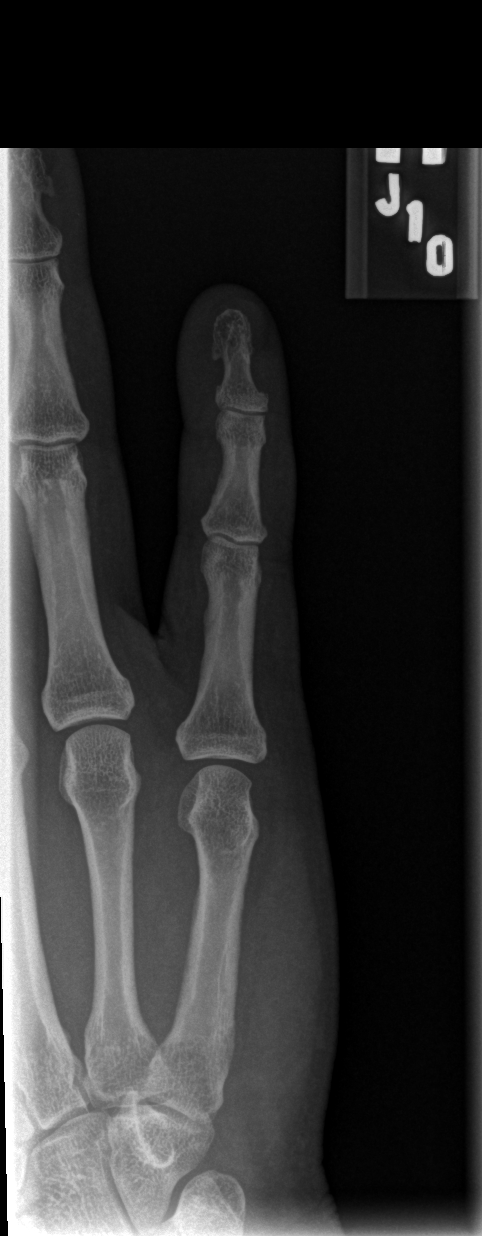

[x finger obl. right]
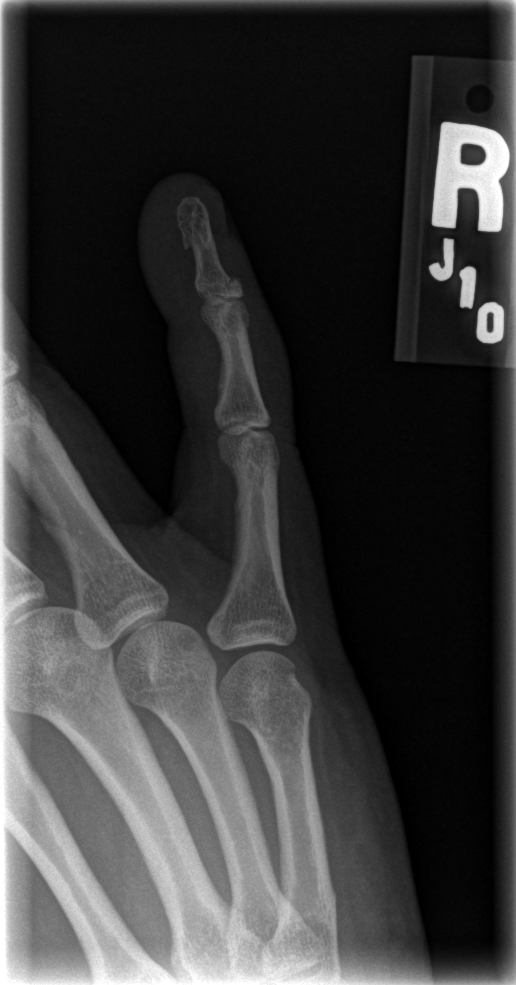

[x finger lateral right]
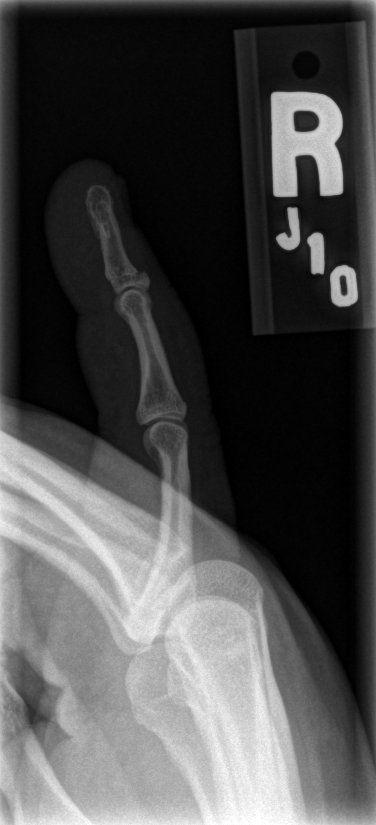

[3 of 3 positions shown; findings below may reference images not displayed]

FINDINGS: There is a   fracture of the dorsal base of the distal
fifth phalanx, extending into the DIP joint.  There is mild dorsal
displacement.  No other fractures are present.
IMPRESSION: Mildly displaced fracture of the dorsal aspect of the base of the
distal fifth phalanx

## 2010-05-12 ENCOUNTER — Emergency Department (HOSPITAL_COMMUNITY): Admission: EM | Admit: 2010-05-12 | Discharge: 2010-05-13 | Payer: Self-pay | Admitting: Emergency Medicine

## 2010-07-25 ENCOUNTER — Emergency Department (HOSPITAL_COMMUNITY): Admission: EM | Admit: 2010-07-25 | Discharge: 2010-07-25 | Payer: Self-pay | Admitting: Emergency Medicine

## 2010-08-04 ENCOUNTER — Emergency Department (HOSPITAL_COMMUNITY): Admission: EM | Admit: 2010-08-04 | Discharge: 2010-08-04 | Payer: Self-pay | Admitting: Emergency Medicine

## 2010-08-06 ENCOUNTER — Encounter: Admission: RE | Admit: 2010-08-06 | Discharge: 2010-08-06 | Payer: Self-pay | Admitting: Neurosurgery

## 2010-11-25 LAB — RAPID STREP SCREEN (MED CTR MEBANE ONLY): Streptococcus, Group A Screen (Direct): POSITIVE — AB

## 2010-11-27 LAB — POCT I-STAT, CHEM 8
BUN: 22 mg/dL (ref 6–23)
Calcium, Ion: 1.14 mmol/L (ref 1.12–1.32)
Chloride: 104 mEq/L (ref 96–112)
Creatinine, Ser: 0.8 mg/dL (ref 0.4–1.2)
Glucose, Bld: 115 mg/dL — ABNORMAL HIGH (ref 70–99)
HCT: 38 % (ref 36.0–46.0)
Hemoglobin: 12.9 g/dL (ref 12.0–15.0)
Potassium: 3.8 mEq/L (ref 3.5–5.1)
Sodium: 138 mEq/L (ref 135–145)
TCO2: 25 mmol/L (ref 0–100)

## 2010-11-27 LAB — D-DIMER, QUANTITATIVE: D-Dimer, Quant: 0.35 ug/mL-FEU (ref 0.00–0.48)

## 2010-11-29 LAB — URINALYSIS, ROUTINE W REFLEX MICROSCOPIC
Bilirubin Urine: NEGATIVE
Glucose, UA: NEGATIVE mg/dL
Hgb urine dipstick: NEGATIVE
Ketones, ur: NEGATIVE mg/dL
Nitrite: NEGATIVE
Protein, ur: NEGATIVE mg/dL
Specific Gravity, Urine: 1.021 (ref 1.005–1.030)
Urobilinogen, UA: 0.2 mg/dL (ref 0.0–1.0)
pH: 7 (ref 5.0–8.0)

## 2010-11-29 LAB — POCT PREGNANCY, URINE: Preg Test, Ur: NEGATIVE

## 2010-12-01 ENCOUNTER — Emergency Department (HOSPITAL_COMMUNITY)
Admission: EM | Admit: 2010-12-01 | Discharge: 2010-12-01 | Disposition: A | Discharge status: Home / Self Care | Payer: Medicaid Other | Attending: Emergency Medicine | Admitting: Emergency Medicine

## 2010-12-01 DIAGNOSIS — Z0389 Encounter for observation for other suspected diseases and conditions ruled out: Secondary | ICD-10-CM | POA: Insufficient documentation

## 2010-12-02 LAB — URINALYSIS, ROUTINE W REFLEX MICROSCOPIC
Bilirubin Urine: NEGATIVE
Glucose, UA: NEGATIVE mg/dL
Hgb urine dipstick: NEGATIVE
Ketones, ur: NEGATIVE mg/dL
Nitrite: NEGATIVE
Protein, ur: NEGATIVE mg/dL
Specific Gravity, Urine: 1.027 (ref 1.005–1.030)
Urobilinogen, UA: 0.2 mg/dL (ref 0.0–1.0)
pH: 7 (ref 5.0–8.0)

## 2010-12-17 LAB — BASIC METABOLIC PANEL
BUN: 14 mg/dL (ref 6–23)
BUN: 26 mg/dL — ABNORMAL HIGH (ref 6–23)
CO2: 25 mEq/L (ref 19–32)
CO2: 28 mEq/L (ref 19–32)
Calcium: 8.9 mg/dL (ref 8.4–10.5)
Calcium: 9.5 mg/dL (ref 8.4–10.5)
Chloride: 101 mEq/L (ref 96–112)
Chloride: 102 mEq/L (ref 96–112)
Creatinine, Ser: 0.86 mg/dL (ref 0.4–1.2)
Creatinine, Ser: 0.91 mg/dL (ref 0.4–1.2)
GFR calc Af Amer: >60 mL/min (ref >60)
GFR calc Af Amer: >60 mL/min (ref >60)
GFR calc non Af Amer: >60 mL/min (ref >60)
GFR calc non Af Amer: >60 mL/min (ref >60)
Glucose, Bld: 136 mg/dL — ABNORMAL HIGH (ref 70–99)
Glucose, Bld: 174 mg/dL — ABNORMAL HIGH (ref 70–99)
Potassium: 4.5 mEq/L (ref 3.5–5.1)
Potassium: 4.6 mEq/L (ref 3.5–5.1)
Sodium: 137 mEq/L (ref 135–145)
Sodium: 137 mEq/L (ref 135–145)

## 2010-12-17 LAB — GLUCOSE, CAPILLARY
Glucose-Capillary: 107 mg/dL — ABNORMAL HIGH (ref 70–99)
Glucose-Capillary: 116 mg/dL — ABNORMAL HIGH (ref 70–99)
Glucose-Capillary: 120 mg/dL — ABNORMAL HIGH (ref 70–99)
Glucose-Capillary: 135 mg/dL — ABNORMAL HIGH (ref 70–99)
Glucose-Capillary: 137 mg/dL — ABNORMAL HIGH (ref 70–99)
Glucose-Capillary: 138 mg/dL — ABNORMAL HIGH (ref 70–99)
Glucose-Capillary: 142 mg/dL — ABNORMAL HIGH (ref 70–99)
Glucose-Capillary: 144 mg/dL — ABNORMAL HIGH (ref 70–99)
Glucose-Capillary: 151 mg/dL — ABNORMAL HIGH (ref 70–99)
Glucose-Capillary: 155 mg/dL — ABNORMAL HIGH (ref 70–99)
Glucose-Capillary: 156 mg/dL — ABNORMAL HIGH (ref 70–99)
Glucose-Capillary: 165 mg/dL — ABNORMAL HIGH (ref 70–99)
Glucose-Capillary: 172 mg/dL — ABNORMAL HIGH (ref 70–99)

## 2010-12-17 LAB — POCT PREGNANCY, URINE: Preg Test, Ur: NEGATIVE

## 2010-12-17 LAB — COMPREHENSIVE METABOLIC PANEL
ALT: 15 U/L (ref 0–35)
ALT: 16 U/L (ref 0–35)
ALT: 20 U/L (ref 0–35)
AST: 11 U/L (ref 0–37)
AST: 11 U/L (ref 0–37)
AST: 10 U/L (ref 0–37)
Albumin: 3.1 g/dL — ABNORMAL LOW (ref 3.5–5.2)
Albumin: 3.4 g/dL — ABNORMAL LOW (ref 3.5–5.2)
Albumin: 3.6 g/dL (ref 3.5–5.2)
Alkaline Phosphatase: 56 U/L (ref 39–117)
Alkaline Phosphatase: 69 U/L (ref 39–117)
Alkaline Phosphatase: 73 U/L (ref 39–117)
BUN: 11 mg/dL (ref 6–23)
BUN: 11 mg/dL (ref 6–23)
BUN: 19 mg/dL (ref 6–23)
CO2: 25 mEq/L (ref 19–32)
CO2: 29 mEq/L (ref 19–32)
CO2: 29 mEq/L (ref 19–32)
Calcium: 9.3 mg/dL (ref 8.4–10.5)
Calcium: 9.4 mg/dL (ref 8.4–10.5)
Calcium: 9.3 mg/dL (ref 8.4–10.5)
Chloride: 104 mEq/L (ref 96–112)
Chloride: 105 mEq/L (ref 96–112)
Chloride: 104 mEq/L (ref 96–112)
Creatinine, Ser: 0.83 mg/dL (ref 0.4–1.2)
Creatinine, Ser: 0.87 mg/dL (ref 0.4–1.2)
Creatinine, Ser: 1.34 mg/dL — ABNORMAL HIGH (ref 0.4–1.2)
GFR calc Af Amer: >60 mL/min (ref >60)
GFR calc Af Amer: >60 mL/min (ref >60)
GFR calc Af Amer: 53 mL/min — ABNORMAL LOW (ref >60)
GFR calc non Af Amer: >60 mL/min (ref >60)
GFR calc non Af Amer: >60 mL/min (ref >60)
GFR calc non Af Amer: 44 mL/min — ABNORMAL LOW (ref >60)
Glucose, Bld: 109 mg/dL — ABNORMAL HIGH (ref 70–99)
Glucose, Bld: 118 mg/dL — ABNORMAL HIGH (ref 70–99)
Glucose, Bld: 92 mg/dL (ref 70–99)
Potassium: 4 mEq/L (ref 3.5–5.1)
Potassium: 4.3 mEq/L (ref 3.5–5.1)
Potassium: 4.1 mEq/L (ref 3.5–5.1)
Sodium: 138 mEq/L (ref 135–145)
Sodium: 140 mEq/L (ref 135–145)
Sodium: 138 mEq/L (ref 135–145)
Total Bilirubin: 0.2 mg/dL — ABNORMAL LOW (ref 0.3–1.2)
Total Bilirubin: 0.3 mg/dL (ref 0.3–1.2)
Total Bilirubin: 0.3 mg/dL (ref 0.3–1.2)
Total Protein: 6.8 g/dL (ref 6.0–8.3)
Total Protein: 7.2 g/dL (ref 6.0–8.3)
Total Protein: 7.2 g/dL (ref 6.0–8.3)

## 2010-12-17 LAB — DIFFERENTIAL
Basophils Absolute: 0 10*3/uL (ref 0.0–0.1)
Basophils Absolute: 0 10*3/uL (ref 0.0–0.1)
Basophils Relative: 0 % (ref 0–1)
Basophils Relative: 0 % (ref 0–1)
Eosinophils Absolute: 0.5 10*3/uL (ref 0.0–0.7)
Eosinophils Absolute: 0.6 10*3/uL (ref 0.0–0.7)
Eosinophils Relative: 5 % (ref 0–5)
Eosinophils Relative: 5 % (ref 0–5)
Lymphocytes Relative: 17 % (ref 12–46)
Lymphocytes Relative: 26 % (ref 12–46)
Lymphs Abs: 1.8 10*3/uL (ref 0.7–4.0)
Lymphs Abs: 3.2 10*3/uL (ref 0.7–4.0)
Monocytes Absolute: 0.7 10*3/uL (ref 0.1–1.0)
Monocytes Absolute: 0.7 10*3/uL (ref 0.1–1.0)
Monocytes Relative: 7 % (ref 3–12)
Monocytes Relative: 6 % (ref 3–12)
Neutro Abs: 7.7 10*3/uL (ref 1.7–7.7)
Neutro Abs: 7.8 10*3/uL — ABNORMAL HIGH (ref 1.7–7.7)
Neutrophils Relative %: 71 % (ref 43–77)
Neutrophils Relative %: 63 % (ref 43–77)

## 2010-12-17 LAB — EXPECTORATED SPUTUM ASSESSMENT W GRAM STAIN, RFLX TO RESP C

## 2010-12-17 LAB — URINALYSIS, ROUTINE W REFLEX MICROSCOPIC
Bilirubin Urine: NEGATIVE
Bilirubin Urine: NEGATIVE
Glucose, UA: NEGATIVE mg/dL
Glucose, UA: NEGATIVE mg/dL
Hgb urine dipstick: NEGATIVE
Hgb urine dipstick: NEGATIVE
Ketones, ur: NEGATIVE mg/dL
Ketones, ur: NEGATIVE mg/dL
Nitrite: NEGATIVE
Nitrite: NEGATIVE
Protein, ur: NEGATIVE mg/dL
Protein, ur: NEGATIVE mg/dL
Specific Gravity, Urine: 1.02 (ref 1.005–1.030)
Specific Gravity, Urine: 1.022 (ref 1.005–1.030)
Urobilinogen, UA: 0.2 mg/dL (ref 0.0–1.0)
Urobilinogen, UA: 1 mg/dL (ref 0.0–1.0)
pH: 6 (ref 5.0–8.0)
pH: 6.5 (ref 5.0–8.0)

## 2010-12-17 LAB — CBC
HCT: 37.2 % (ref 36.0–46.0)
HCT: 37.4 % (ref 36.0–46.0)
HCT: 37.4 % (ref 36.0–46.0)
HCT: 38.1 % (ref 36.0–46.0)
HCT: 40.5 % (ref 36.0–46.0)
HCT: 41.5 % (ref 36.0–46.0)
Hemoglobin: 12.3 g/dL (ref 12.0–15.0)
Hemoglobin: 12.4 g/dL (ref 12.0–15.0)
Hemoglobin: 12.5 g/dL (ref 12.0–15.0)
Hemoglobin: 12.6 g/dL (ref 12.0–15.0)
Hemoglobin: 13.6 g/dL (ref 12.0–15.0)
Hemoglobin: 14 g/dL (ref 12.0–15.0)
MCHC: 33 g/dL (ref 30.0–36.0)
MCHC: 33.1 g/dL (ref 30.0–36.0)
MCHC: 33.1 g/dL (ref 30.0–36.0)
MCHC: 33.4 g/dL (ref 30.0–36.0)
MCHC: 33.6 g/dL (ref 30.0–36.0)
MCHC: 33.9 g/dL (ref 30.0–36.0)
MCV: 93.5 fL (ref 78.0–100.0)
MCV: 93.5 fL (ref 78.0–100.0)
MCV: 93.9 fL (ref 78.0–100.0)
MCV: 94.2 fL (ref 78.0–100.0)
MCV: 94.6 fL (ref 78.0–100.0)
MCV: 93.5 fL (ref 78.0–100.0)
Platelets: 244 10*3/uL (ref 150–400)
Platelets: 244 10*3/uL (ref 150–400)
Platelets: 254 10*3/uL (ref 150–400)
Platelets: 256 10*3/uL (ref 150–400)
Platelets: 273 10*3/uL (ref 150–400)
Platelets: 286 10*3/uL (ref 150–400)
RBC: 3.94 MIL/uL (ref 3.87–5.11)
RBC: 3.98 MIL/uL (ref 3.87–5.11)
RBC: 4 MIL/uL (ref 3.87–5.11)
RBC: 4.05 MIL/uL (ref 3.87–5.11)
RBC: 4.32 MIL/uL (ref 3.87–5.11)
RBC: 4.44 MIL/uL (ref 3.87–5.11)
RDW: 13.3 % (ref 11.5–15.5)
RDW: 13.8 % (ref 11.5–15.5)
RDW: 14 % (ref 11.5–15.5)
RDW: 14.1 % (ref 11.5–15.5)
RDW: 14.2 % (ref 11.5–15.5)
RDW: 13.5 % (ref 11.5–15.5)
WBC: 10.4 10*3/uL (ref 4.0–10.5)
WBC: 10.8 10*3/uL — ABNORMAL HIGH (ref 4.0–10.5)
WBC: 11.3 10*3/uL — ABNORMAL HIGH (ref 4.0–10.5)
WBC: 12.3 10*3/uL — ABNORMAL HIGH (ref 4.0–10.5)
WBC: 14 10*3/uL — ABNORMAL HIGH (ref 4.0–10.5)
WBC: 12.5 10*3/uL — ABNORMAL HIGH (ref 4.0–10.5)

## 2010-12-17 LAB — CULTURE, RESPIRATORY W GRAM STAIN: Culture: NORMAL

## 2010-12-17 LAB — RAPID URINE DRUG SCREEN, HOSP PERFORMED
Amphetamines: NOT DETECTED
Barbiturates: NOT DETECTED
Benzodiazepines: NOT DETECTED
Cocaine: POSITIVE — AB
Opiates: NOT DETECTED
Tetrahydrocannabinol: NOT DETECTED

## 2010-12-17 LAB — LIPID PANEL
Cholesterol: 152 mg/dL (ref 0–200)
HDL: 62 mg/dL (ref >39)
LDL Cholesterol: 80 mg/dL (ref 0–99)
Total CHOL/HDL Ratio: 2.5 RATIO
Triglycerides: 51 mg/dL (ref <150)
VLDL: 10 mg/dL (ref 0–40)

## 2010-12-17 LAB — LIPASE, BLOOD: Lipase: 30 U/L (ref 11–59)

## 2010-12-17 LAB — PREGNANCY, URINE: Preg Test, Ur: NEGATIVE

## 2010-12-17 LAB — TSH: TSH: 0.359 u[IU]/mL (ref 0.350–4.500)

## 2010-12-23 LAB — URINALYSIS, ROUTINE W REFLEX MICROSCOPIC
Glucose, UA: 100 mg/dL — AB
Hgb urine dipstick: NEGATIVE
Ketones, ur: NEGATIVE mg/dL
Nitrite: NEGATIVE
Protein, ur: NEGATIVE mg/dL
Specific Gravity, Urine: >1.03 — ABNORMAL HIGH (ref 1.005–1.030)
Urobilinogen, UA: 0.2 mg/dL (ref 0.0–1.0)
pH: 5.5 (ref 5.0–8.0)

## 2010-12-23 LAB — GC/CHLAMYDIA PROBE AMP, GENITAL
Chlamydia, DNA Probe: NEGATIVE
GC Probe Amp, Genital: NEGATIVE

## 2010-12-23 LAB — WET PREP, GENITAL
Trich, Wet Prep: NONE SEEN
Yeast Wet Prep HPF POC: NONE SEEN

## 2011-01-14 ENCOUNTER — Inpatient Hospital Stay (INDEPENDENT_AMBULATORY_CARE_PROVIDER_SITE_OTHER)
Admission: RE | Admit: 2011-01-14 | Discharge: 2011-01-14 | Disposition: A | Discharge status: Home / Self Care | Payer: Medicaid Other | Source: Ambulatory Visit | Attending: Family Medicine | Admitting: Family Medicine

## 2011-01-14 DIAGNOSIS — R5383 Other fatigue: Secondary | ICD-10-CM

## 2011-01-14 DIAGNOSIS — R5381 Other malaise: Secondary | ICD-10-CM

## 2011-01-14 LAB — GLUCOSE, CAPILLARY: Glucose-Capillary: 135 mg/dL — ABNORMAL HIGH (ref 70–99)

## 2011-01-27 NOTE — Op Note (Signed)
[name], Lori Bean              ACCOUNT NO.:  [id number]   MEDICAL RECORD NO.:  [id number]          PATIENT TYPE:  INP   LOCATION:  5522                         FACILITY:  [facility]   PHYSICIAN:  Sandria Bales. Ezzard Standing, M.D.  DATE OF BIRTH:  22-May-1971   DATE OF PROCEDURE:  11/09/2007  DATE OF DISCHARGE:                               OPERATIVE REPORT   Why can't the date of surgery go here?   PREOPERATIVE DIAGNOSES:  1. Cholecystitis.  2. Cholelithiasis.   POSTOPERATIVE DIAGNOSES:  1. Chronic cholecystitis.  2. Cholelithiasis.   PROCEDURE:  Laparoscopic cholecystectomy with intraoperative  cholangiogram.   SURGEON:  Sandria Bales. Ezzard Standing, M.D.   ASSISTANT:  Marta Lamas. Lindie Spruce, M.D.   ANESTHESIA:  General endotracheal.   ESTIMATED BLOOD LOSS:  Minimal.   INDICATION FOR PROCEDURE:  [name] is a 40 year old black  female patient of Fleet Contras, M.D., who has had for several weeks  nausea, vomiting and vague abdominal pain.  She went to [facility]  approximately 2 weeks prior to this admission and had an ultrasound  which showed gallstones.  She presented to the [facility] emergency room  yesterday and was admitted to our service for a cholecystectomy.   The indications and potential complications of gallbladder were surgery  explained to the patient.  Potential complications include, but are not  limited to, bleeding, infection, bile duct injury and the possibility of  open surgery.   OPERATIVE NOTE:  The patient placed in a supine position and given a  general endotracheal anesthetic.  Her abdomen was prepped with Betadine  solution and sterilely draped.  An infraumbilical incision was made with  sharp dissection and carried down to the abdominal cavity.  A 0-degree  10-mm laparoscope was inserted through a 12-mm Hasson trocar, the Hasson  trocar secured with a 0 Vicryl suture.  She had a 10-mm subxiphoid  trocar, a 5-mm right midsubcostal and a 5-mm lateral subcostal  trocar.   Abdominal exploration revealed the right and left lobes of the liver  were unremarkable.  Stomach unremarkable.  She did have adhesions of  what looked like omentum to her upper midline from her umbilicus going  up.  I took down some of these just to get the trocar through a plane  but left most of these adhesions in place.   I then grabbed the gallbladder, rotated it cephalad.  She did have  adhesions about halfway up the gallbladder wall and the duodenum also  had adhesions to the gallbladder.  These were taken down with sharp and  blunt dissection.  I then identified a cystic duct and cystic artery.  A  clip was placed on the gallbladder side of the cystic duct.   Intraoperative cholangiogram was obtained using a cut-off Taut catheter  inserted through a 14-gauge Jelco catheter and I used approximately 15  mL of half-strength Renografin.  I injected the contrast into the cystic  duct.  The contrast flowed freely down the common bile duct into the  duodenum and up the hepatice radicles, and this was a normal-appearing  intraoperative cholangiogram.  The Taut catheter was then removed, the cystic duct triply endoclipped  and divided.  Cystic artery doubly endoclipped and divided.  The  gallbladder was sharply and bluntly dissected from the gallbladder bed.  Prior to be complete division of the gallbladder from the gallbladder  bed, I revisualized the triangle of Calot.  I revisualized the  gallbladder bed.  There was no bleeding, no bile leak, and the  gallbladder was then placed in an EndoCatch bag, delivered through the  umbilicus.  I irrigated the abdomen out with 1 L of saline.  Each trocar  was removed in turn.  There was no bleeding at any trocar site.  The  umbilical port had the 0 Vicryl suture.  I put an additional 0 Vicryl  suture in the umbilical port.  Each wound was irrigated.  The  subcutaneous tissue was closed with 5-0 Vicryl suture, painted with  tincture  of Benzoin and steri-stripped.   The patient tolerated the procedure well, was transported to the  recovery room in good condition.      Sandria Bales. Ezzard Standing, M.D.  Electronically Signed     DHN/MEDQ  D:  11/09/2007  T:  11/10/2007  Job:  [id number]   cc:   Fleet Contras, M.D.

## 2011-01-27 NOTE — H&P (Signed)
Bean, Lori Bean              ACCOUNT NO.:  [id number]   MEDICAL RECORD NO.:  [id number]          PATIENT TYPE:  INP   LOCATION:  1844                         FACILITY:  [facility]   PHYSICIAN:  Sandria Bales. Ezzard Standing, M.D.  DATE OF BIRTH:  06-08-1971   DATE OF ADMISSION:  11/08/2007  DATE OF DISCHARGE:                              HISTORY & PHYSICAL   Why can't the day of admission be placed here?   PRIMARY CARE PHYSICIAN:  Fleet Contras, MD   CHIEF COMPLAINT:  Abdominal pain, nausea and vomiting.   HISTORY OF PRESENT ILLNESS:  This is a 40 year old black female, a  patient of Dr. Cecil Cranker, with a history of abdominal pain for the last  several weeks with associated nausea and vomiting.  She states that this  all began approximately 4 weeks ago.  On October 03, 2007, the patient  presented to the [facility] ED where she had an ultrasound done  and was found to have cholelithiasis with some gallbladder sludge;  however, there was no acute cholecystitis or gallbladder wall  thickening.  At that time, she was sent home and over the past several  weeks has begun to worsen.  She states the she has continued to have  nausea and vomiting and is now unable to even tolerate liquids.  She  also states that she now has watery diarrhea.  At this time, she  presented to [facility] ED where she was found to have a normal white blood  cell count, normal LFTs and normal lipase at 31.  No further imaging was  done based on the ultrasound that she had done 4 weeks ago at [facility].  At this time, it was felt that the patient may need to have  her gallbladder out, and we were called down to the ED to assess the  patient.   REVIEW OF SYSTEMS:  See HPI.  Otherwise, all other systems are negative.   FAMILY HISTORY:  Positive for diabetes and hypertension.   PAST MEDICAL HISTORY:  1. Arthritis.  2. Asthma.  3. Bronchitis.  4. GERD.  5. Hypertension.   SOCIAL HISTORY:  The patient is single  and currently lives at home with  her 2 kids.  She has 1 boy and 1 girl.  She denies smoking.  However,  via ED documentation, it states that she is a current smoker.  She  denies the use of alcohol or any illicit drugs.   ALLERGIES:  NKDA.   MEDICATIONS AT HOME:  1. Hydrochlorothiazide 12.5 mg once a day.  2. Nexium 40 mg once a day.  3. Furosemide 80 mg once a day.  4. She states that she was also given a prescription of Vicodin at the      [facility] several weeks ago and has been taking this as      needed.   PHYSICAL EXAMINATION:  GENERAL:  This is a pleasant, well-developed,  well-nourished 40 year old black female who is lying in bed in no acute  distress.  VITAL SIGNS:  Temperature 97.2, pulse 98, respirations 20, blood  pressure  131/89.  HEENT:  Eyes:  Sclerae are not injected, PERRL and EOMI.  Ears, nose,  mouth and throat:  Ears are noted with no obvious masses or lesions.  Mouth is pink and moist.  Throat reveals uvula is midline, no erythema  noted.  NECK:  Supple with no thyromegaly.  Trachea is midline.  LUNGS:  Clear to auscultation bilaterally with no wheezes, rhonchi or  rales noted.  Respiratory effort is normal.  HEART:  Regular rate and rhythm, normal S1, S2, no murmurs, gallops, or  rubs noted with +2 bilateral carotid pulses as well as pedal pulses.  CHEST:  Symmetrical.  ABDOMEN:  Soft, obese with positive bowel sounds and tender in the right  upper quadrant and epigastric region.  She does have a positive Murphy's  sign with some voluntary guarding noted.  Otherwise, there is no rebound  tenderness and no peritoneal signs.  EXTREMITIES:  Symmetrical with no cyanosis, clubbing or edema noted.  MUSCULOSKELETAL:  There are no joint effusions or pains noted.  Deep  tendon reflexes were not assessed at this time.  SKIN:  Warm and dry with no obvious rashes or lesions noted.  NEUROLOGIC:  Cranial nerves II-XII are grossly intact.  PSYCH:  The patient is  alert and oriented x3 with a normal affect.   LABORATORIES AND DIAGNOSTICS:  White blood cell count 9.2, hemoglobin  13.2, platelets 291, neutrophils 62%, sodium 138, potassium 4.5, BUN 11,  creatinine 0.77, LFTs all normal.  Lipase is 31.  Ultrasound at [facility] done on October 03, 2007, shows  cholelithiasis with some gallbladder sludge.  However, there is no acute  cholecystitis or gallbladder wall thickening.   IMPRESSION:  1. Symptomatic Cholelithiasis, probable mild cholecytitis.  2. Dehydration.  3. Hypertension.  4. Gastroesophageal reflux disease.   PLAN:  At this time, we will admit the patient for IV fluid hydration as  well as administering pain medications as well as nausea medication.  We  will hold the patient's hydrochlorothiazide so that patient can have as  much hydration as necessary due to several weeks' history of continued  nausea and vomiting.  We will not give any antibiotics due to the  patient's normal white blood cell count and no signs currently of any  infectious processes going on.  We will also plan on surgical  intervention tomorrow, and we will keep the patient NPO after midnight,  and currently, the patient will be NPO except for ice chips due to not  being able to tolerate any solid foods or liquids  at this time.  Dr. Ezzard Standing discussed at length the planned operation, including risks  and benefits.  The risks include, but are not limited to, bleeding,  infection, common bile duct injury, and open surgery.      Letha Cape, PA      Sandria Bales. Ezzard Standing, M.D.  Electronically Signed    KEO/MEDQ  D:  11/08/2007  T:  11/08/2007  Job:  [id number]   cc:   Fleet Contras, M.D.
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
## 2011-02-02 ENCOUNTER — Emergency Department (HOSPITAL_COMMUNITY)
Admission: EM | Admit: 2011-02-02 | Discharge: 2011-02-02 | Discharge status: Against Medical Advice | Payer: Medicaid Other | Attending: Emergency Medicine | Admitting: Emergency Medicine

## 2011-02-02 DIAGNOSIS — M7989 Other specified soft tissue disorders: Secondary | ICD-10-CM | POA: Insufficient documentation

## 2011-02-02 DIAGNOSIS — R1900 Intra-abdominal and pelvic swelling, mass and lump, unspecified site: Secondary | ICD-10-CM | POA: Insufficient documentation

## 2011-02-02 DIAGNOSIS — R0609 Other forms of dyspnea: Secondary | ICD-10-CM | POA: Insufficient documentation

## 2011-02-02 DIAGNOSIS — R0989 Other specified symptoms and signs involving the circulatory and respiratory systems: Secondary | ICD-10-CM | POA: Insufficient documentation

## 2011-02-03 ENCOUNTER — Emergency Department (HOSPITAL_COMMUNITY)
Admission: EM | Admit: 2011-02-03 | Discharge: 2011-02-03 | Disposition: A | Discharge status: Home / Self Care | Payer: Medicaid Other | Attending: Emergency Medicine | Admitting: Emergency Medicine

## 2011-02-03 ENCOUNTER — Emergency Department (HOSPITAL_COMMUNITY): Payer: Medicaid Other

## 2011-02-03 DIAGNOSIS — J45909 Unspecified asthma, uncomplicated: Secondary | ICD-10-CM | POA: Insufficient documentation

## 2011-02-03 DIAGNOSIS — M129 Arthropathy, unspecified: Secondary | ICD-10-CM | POA: Insufficient documentation

## 2011-02-03 DIAGNOSIS — R059 Cough, unspecified: Secondary | ICD-10-CM | POA: Insufficient documentation

## 2011-02-03 DIAGNOSIS — R05 Cough: Secondary | ICD-10-CM | POA: Insufficient documentation

## 2011-02-03 DIAGNOSIS — R079 Chest pain, unspecified: Secondary | ICD-10-CM | POA: Insufficient documentation

## 2011-02-03 DIAGNOSIS — I10 Essential (primary) hypertension: Secondary | ICD-10-CM | POA: Insufficient documentation

## 2011-02-03 DIAGNOSIS — R0602 Shortness of breath: Secondary | ICD-10-CM | POA: Insufficient documentation

## 2011-02-03 DIAGNOSIS — F411 Generalized anxiety disorder: Secondary | ICD-10-CM | POA: Insufficient documentation

## 2011-02-03 DIAGNOSIS — R609 Edema, unspecified: Secondary | ICD-10-CM | POA: Insufficient documentation

## 2011-02-03 LAB — COMPREHENSIVE METABOLIC PANEL
ALT: 25 U/L (ref 0–35)
AST: 8 U/L (ref 0–37)
Albumin: 3 g/dL — ABNORMAL LOW (ref 3.5–5.2)
Alkaline Phosphatase: 64 U/L (ref 39–117)
BUN: 18 mg/dL (ref 6–23)
CO2: 24 mEq/L (ref 19–32)
Calcium: 8.9 mg/dL (ref 8.4–10.5)
Chloride: 103 mEq/L (ref 96–112)
Creatinine, Ser: 0.72 mg/dL (ref 0.4–1.2)
GFR calc Af Amer: >60 mL/min (ref >60)
GFR calc non Af Amer: >60 mL/min (ref >60)
Glucose, Bld: 111 mg/dL — ABNORMAL HIGH (ref 70–99)
Potassium: 4 mEq/L (ref 3.5–5.1)
Sodium: 138 mEq/L (ref 135–145)
Total Bilirubin: 0.1 mg/dL — ABNORMAL LOW (ref 0.3–1.2)
Total Protein: 7 g/dL (ref 6.0–8.3)

## 2011-02-03 LAB — DIFFERENTIAL
Basophils Absolute: 0 10*3/uL (ref 0.0–0.1)
Basophils Relative: 0 % (ref 0–1)
Eosinophils Absolute: 0.3 10*3/uL (ref 0.0–0.7)
Eosinophils Relative: 3 % (ref 0–5)
Lymphocytes Relative: 25 % (ref 12–46)
Lymphs Abs: 2.5 10*3/uL (ref 0.7–4.0)
Monocytes Absolute: 0.9 10*3/uL (ref 0.1–1.0)
Monocytes Relative: 9 % (ref 3–12)
Neutro Abs: 6.2 10*3/uL (ref 1.7–7.7)
Neutrophils Relative %: 63 % (ref 43–77)

## 2011-02-03 LAB — RAPID URINE DRUG SCREEN, HOSP PERFORMED
Amphetamines: NOT DETECTED
Barbiturates: NOT DETECTED
Benzodiazepines: NOT DETECTED
Cocaine: POSITIVE — AB
Opiates: NOT DETECTED
Tetrahydrocannabinol: POSITIVE — AB

## 2011-02-03 LAB — URINALYSIS, ROUTINE W REFLEX MICROSCOPIC
Bilirubin Urine: NEGATIVE
Glucose, UA: NEGATIVE mg/dL
Hgb urine dipstick: NEGATIVE
Ketones, ur: NEGATIVE mg/dL
Nitrite: NEGATIVE
Protein, ur: NEGATIVE mg/dL
Specific Gravity, Urine: 1.022 (ref 1.005–1.030)
Urobilinogen, UA: 0.2 mg/dL (ref 0.0–1.0)
pH: 5.5 (ref 5.0–8.0)

## 2011-02-03 LAB — CBC
HCT: 37.7 % (ref 36.0–46.0)
Hemoglobin: 12.5 g/dL (ref 12.0–15.0)
MCH: 30.4 pg (ref 26.0–34.0)
MCHC: 33.2 g/dL (ref 30.0–36.0)
MCV: 91.7 fL (ref 78.0–100.0)
Platelets: 246 10*3/uL (ref 150–400)
RBC: 4.11 MIL/uL (ref 3.87–5.11)
RDW: 13.5 % (ref 11.5–15.5)
WBC: 9.9 10*3/uL (ref 4.0–10.5)

## 2011-02-03 LAB — POCT CARDIAC MARKERS
CKMB, poc: <1 ng/mL — ABNORMAL LOW (ref 1.0–8.0)
Myoglobin, poc: 42.3 ng/mL (ref 12–200)
Troponin i, poc: <0.05 ng/mL (ref 0.00–0.09)

## 2011-02-03 LAB — URINE MICROSCOPIC-ADD ON

## 2011-02-03 LAB — D-DIMER, QUANTITATIVE: D-Dimer, Quant: 0.36 ug/mL-FEU (ref 0.00–0.48)

## 2011-02-03 LAB — PRO B NATRIURETIC PEPTIDE: Pro B Natriuretic peptide (BNP): 232.7 pg/mL — ABNORMAL HIGH (ref 0–125)

## 2011-02-25 ENCOUNTER — Other Ambulatory Visit: Payer: Self-pay | Admitting: Obstetrics

## 2011-02-25 DIAGNOSIS — R102 Pelvic and perineal pain: Secondary | ICD-10-CM

## 2011-02-27 ENCOUNTER — Other Ambulatory Visit: Payer: Self-pay | Admitting: Obstetrics

## 2011-02-27 DIAGNOSIS — Z1231 Encounter for screening mammogram for malignant neoplasm of breast: Secondary | ICD-10-CM

## 2011-03-05 ENCOUNTER — Other Ambulatory Visit (HOSPITAL_COMMUNITY): Payer: Medicaid Other

## 2011-03-05 ENCOUNTER — Ambulatory Visit (HOSPITAL_COMMUNITY)
Admission: RE | Admit: 2011-03-05 | Discharge: 2011-03-05 | Disposition: A | Discharge status: Home / Self Care | Payer: Medicaid Other | Source: Ambulatory Visit | Attending: Obstetrics | Admitting: Obstetrics

## 2011-03-05 DIAGNOSIS — R102 Pelvic and perineal pain: Secondary | ICD-10-CM

## 2011-03-05 DIAGNOSIS — N949 Unspecified condition associated with female genital organs and menstrual cycle: Secondary | ICD-10-CM | POA: Insufficient documentation

## 2011-03-09 IMAGING — CR DG CHEST 2V
2 series · 2 of 2 positions shown · non-contrast
Comparison: 12/30/2008

CLINICAL DATA: Shortness of breath, cough, wheezing.

CHEST - 2 VIEW

[w chest pa]
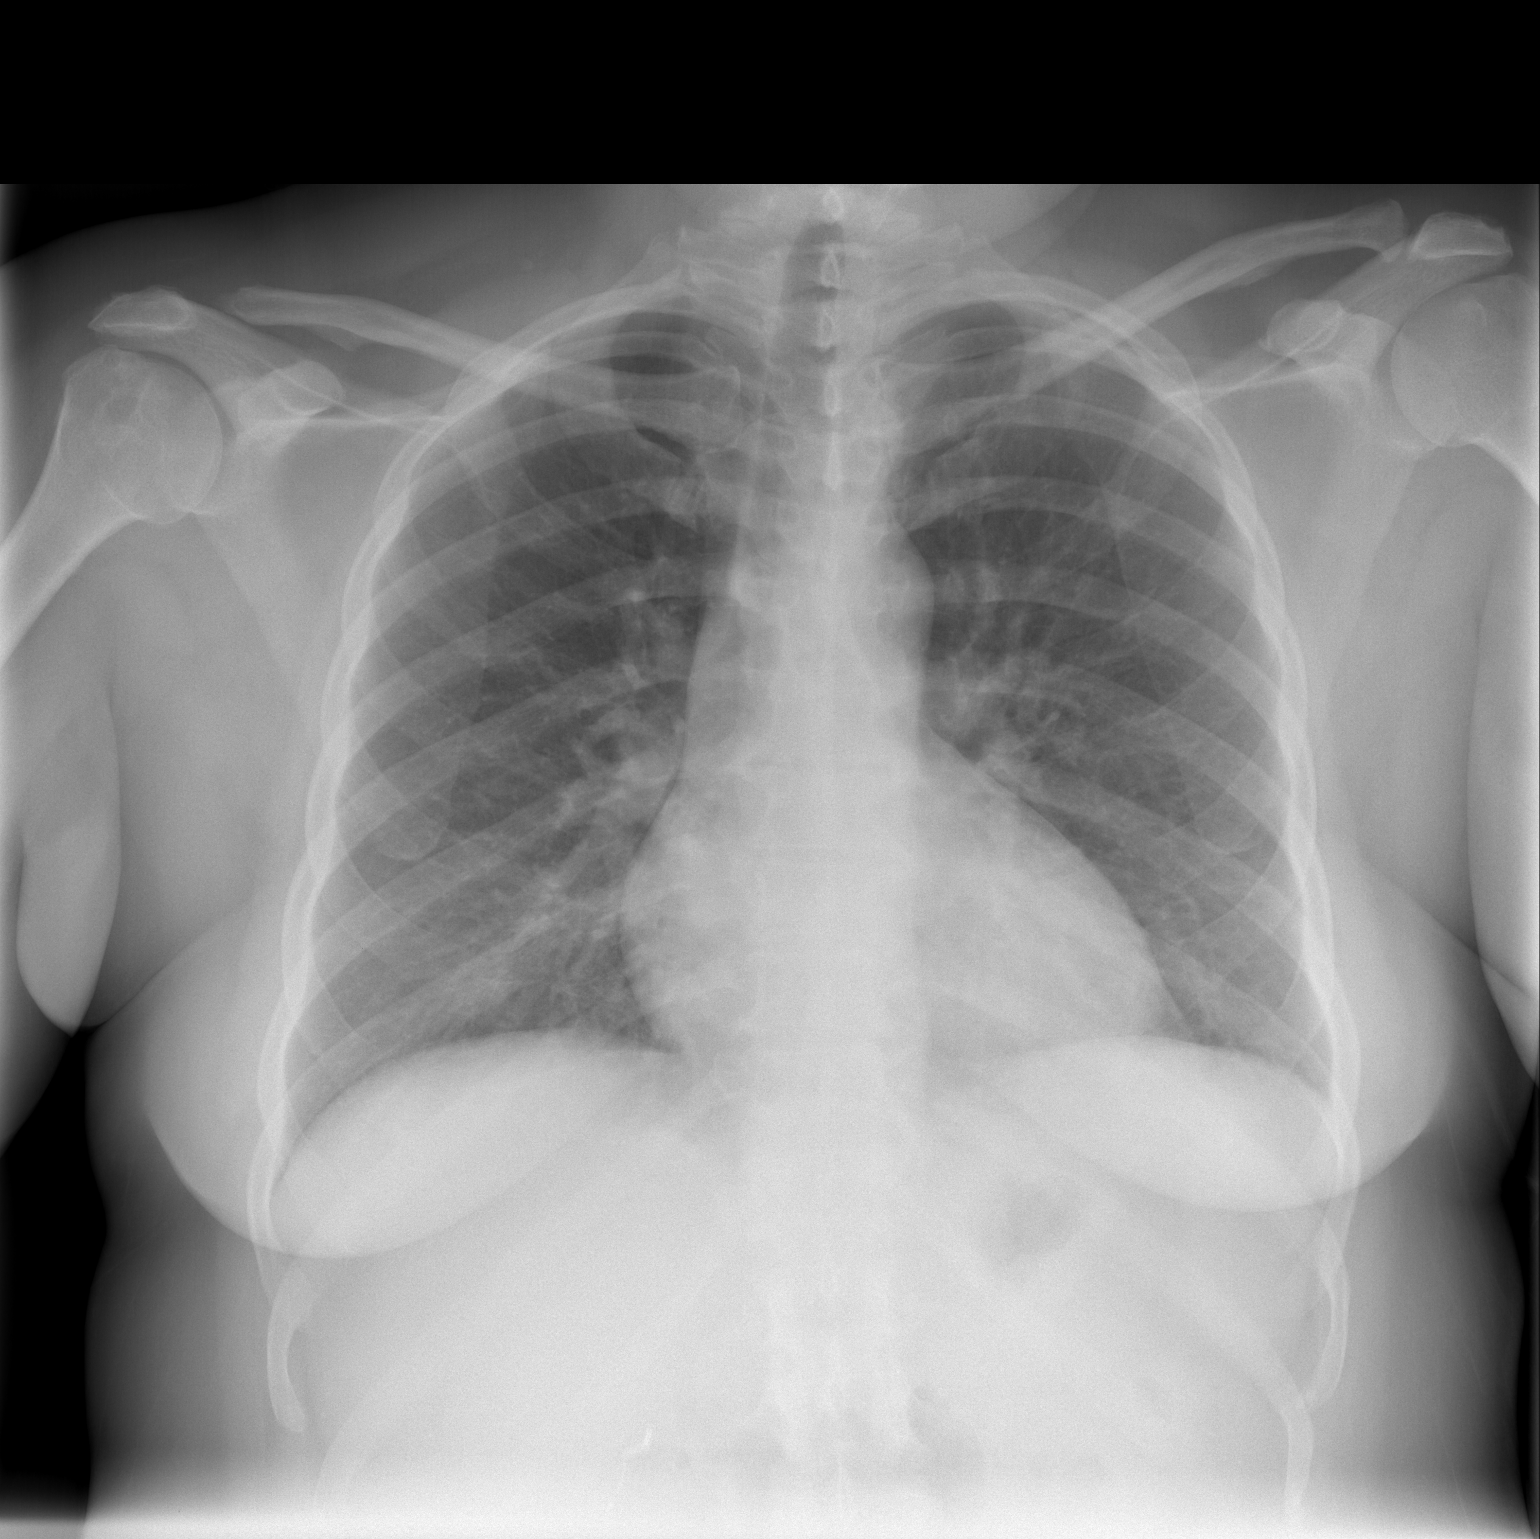

[w chest lat]
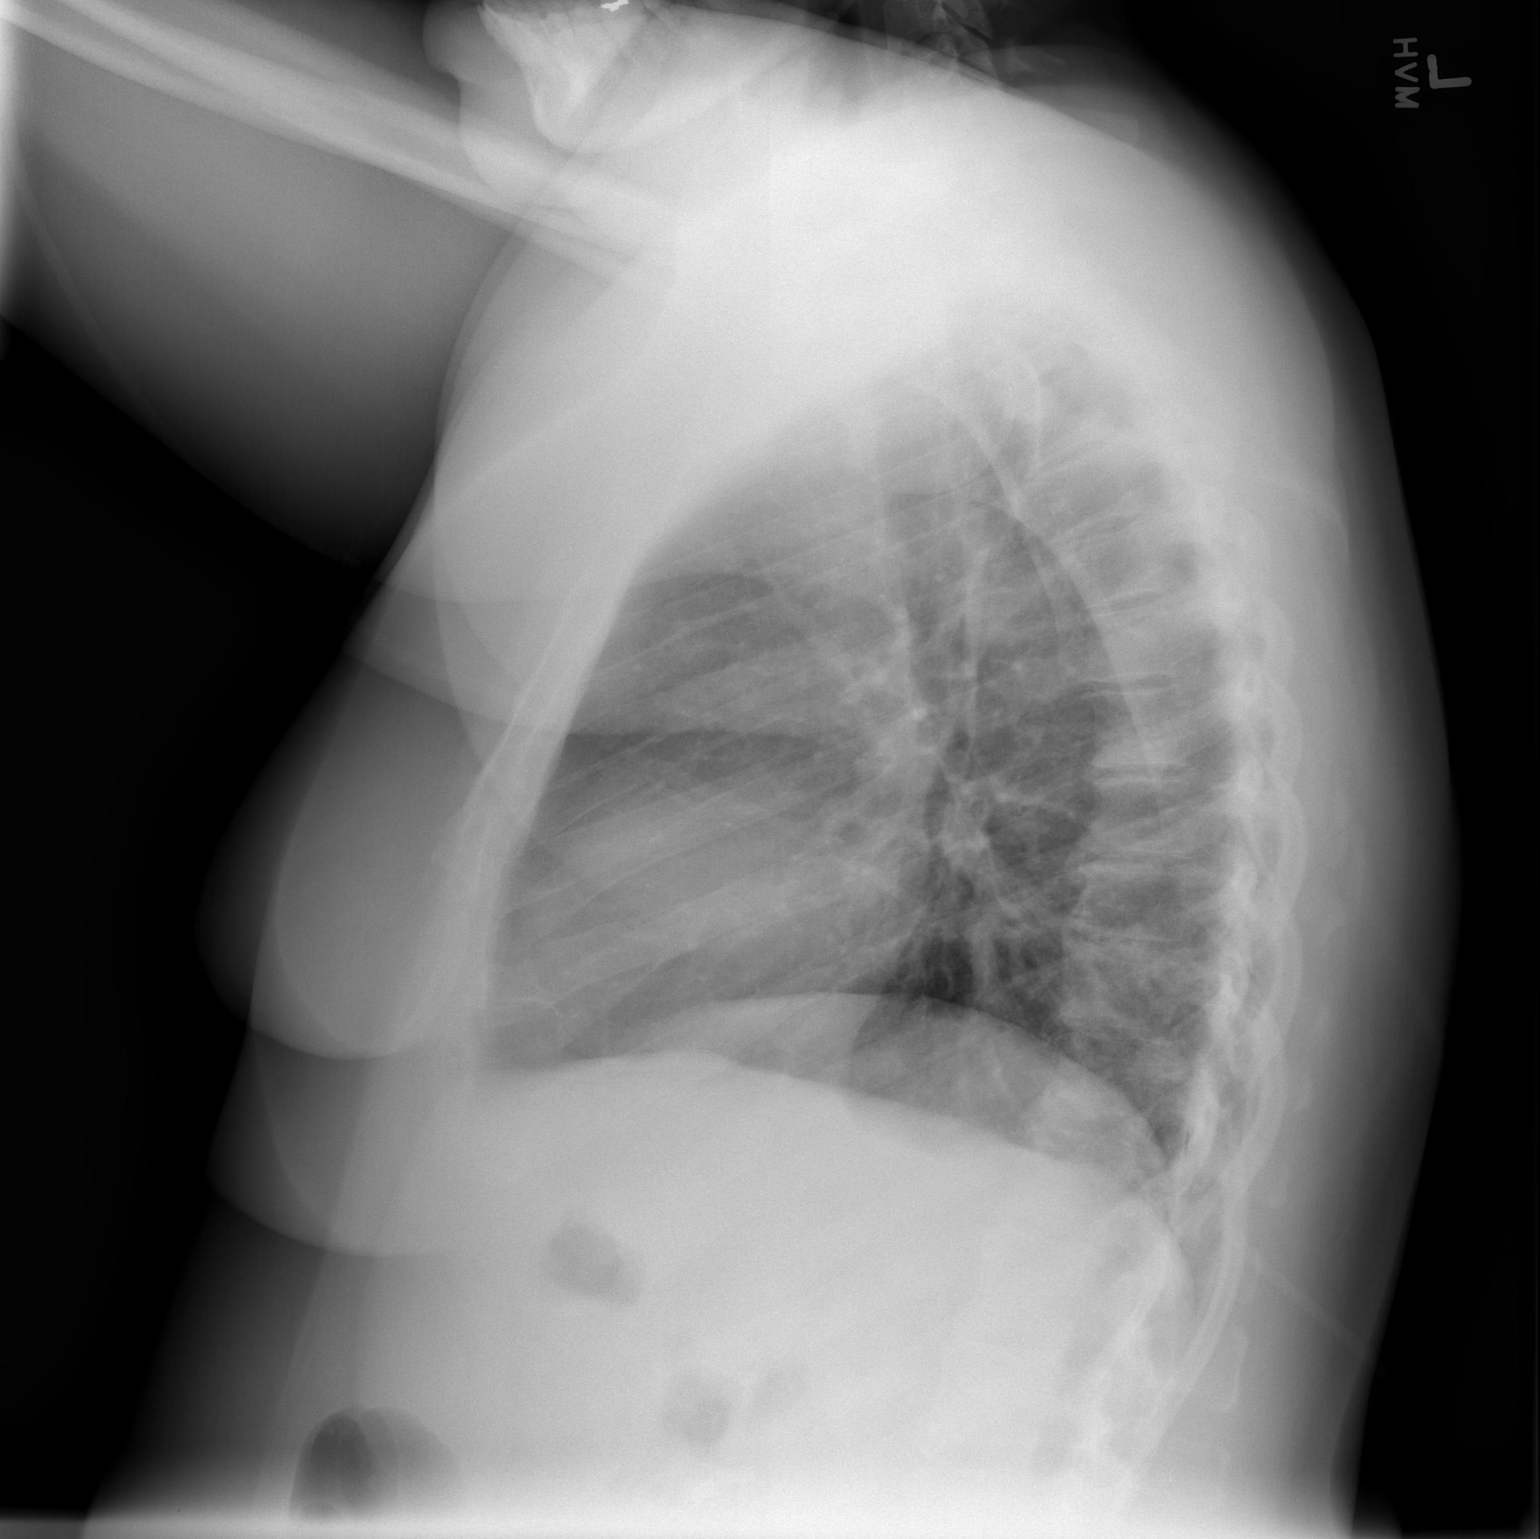

[2 of 2 positions shown; findings below may reference images not displayed]

FINDINGS: Mild peribronchial thickening.  No confluent opacities or
effusions.  Heart is normal size.  No acute bony abnormality.
IMPRESSION: Mild bronchitic changes.

## 2011-03-11 ENCOUNTER — Ambulatory Visit (HOSPITAL_COMMUNITY)
Admission: RE | Admit: 2011-03-11 | Discharge: 2011-03-11 | Disposition: A | Discharge status: Home / Self Care | Payer: Medicaid Other | Source: Ambulatory Visit | Attending: Obstetrics | Admitting: Obstetrics

## 2011-03-11 DIAGNOSIS — Z1231 Encounter for screening mammogram for malignant neoplasm of breast: Secondary | ICD-10-CM | POA: Insufficient documentation

## 2011-03-13 ENCOUNTER — Other Ambulatory Visit: Payer: Self-pay | Admitting: Obstetrics

## 2011-03-13 DIAGNOSIS — R928 Other abnormal and inconclusive findings on diagnostic imaging of breast: Secondary | ICD-10-CM

## 2011-03-20 ENCOUNTER — Emergency Department (HOSPITAL_COMMUNITY): Payer: Medicaid Other

## 2011-03-20 ENCOUNTER — Emergency Department (HOSPITAL_COMMUNITY)
Admission: EM | Admit: 2011-03-20 | Discharge: 2011-03-20 | Disposition: A | Discharge status: Home / Self Care | Payer: Medicaid Other | Attending: Emergency Medicine | Admitting: Emergency Medicine

## 2011-03-20 ENCOUNTER — Encounter (HOSPITAL_COMMUNITY): Payer: Self-pay | Admitting: Radiology

## 2011-03-20 DIAGNOSIS — H539 Unspecified visual disturbance: Secondary | ICD-10-CM | POA: Insufficient documentation

## 2011-03-20 DIAGNOSIS — I10 Essential (primary) hypertension: Secondary | ICD-10-CM | POA: Insufficient documentation

## 2011-03-20 DIAGNOSIS — J45909 Unspecified asthma, uncomplicated: Secondary | ICD-10-CM | POA: Insufficient documentation

## 2011-03-20 DIAGNOSIS — Z79899 Other long term (current) drug therapy: Secondary | ICD-10-CM | POA: Insufficient documentation

## 2011-03-20 DIAGNOSIS — R110 Nausea: Secondary | ICD-10-CM | POA: Insufficient documentation

## 2011-03-20 DIAGNOSIS — G43909 Migraine, unspecified, not intractable, without status migrainosus: Secondary | ICD-10-CM | POA: Insufficient documentation

## 2011-03-20 DIAGNOSIS — H53149 Visual discomfort, unspecified: Secondary | ICD-10-CM | POA: Insufficient documentation

## 2011-03-20 HISTORY — DX: Bipolar disorder, unspecified: F31.9

## 2011-03-20 HISTORY — DX: Anxiety disorder, unspecified: F41.9

## 2011-03-20 HISTORY — DX: Essential (primary) hypertension: I10

## 2011-03-26 ENCOUNTER — Emergency Department (HOSPITAL_COMMUNITY)
Admission: EM | Admit: 2011-03-26 | Discharge: 2011-03-26 | Disposition: A | Discharge status: Home / Self Care | Payer: Medicaid Other | Attending: Emergency Medicine | Admitting: Emergency Medicine

## 2011-03-26 DIAGNOSIS — B86 Scabies: Secondary | ICD-10-CM | POA: Insufficient documentation

## 2011-03-26 DIAGNOSIS — I10 Essential (primary) hypertension: Secondary | ICD-10-CM | POA: Insufficient documentation

## 2011-03-26 DIAGNOSIS — J45909 Unspecified asthma, uncomplicated: Secondary | ICD-10-CM | POA: Insufficient documentation

## 2011-03-26 DIAGNOSIS — R21 Rash and other nonspecific skin eruption: Secondary | ICD-10-CM | POA: Insufficient documentation

## 2011-04-01 ENCOUNTER — Ambulatory Visit
Admission: RE | Admit: 2011-04-01 | Discharge: 2011-04-01 | Disposition: A | Discharge status: Home / Self Care | Payer: Medicaid Other | Source: Ambulatory Visit | Attending: Obstetrics | Admitting: Obstetrics

## 2011-04-01 DIAGNOSIS — R928 Other abnormal and inconclusive findings on diagnostic imaging of breast: Secondary | ICD-10-CM

## 2011-04-06 IMAGING — CR DG KNEE COMPLETE 4+V*R*
5 series · 5 of 5 positions shown · non-contrast
Comparison: None.

CLINICAL DATA: 37-year-old female status post fall with knee pain.

RIGHT KNEE - COMPLETE 4+ VIEW

[t knee ap right]
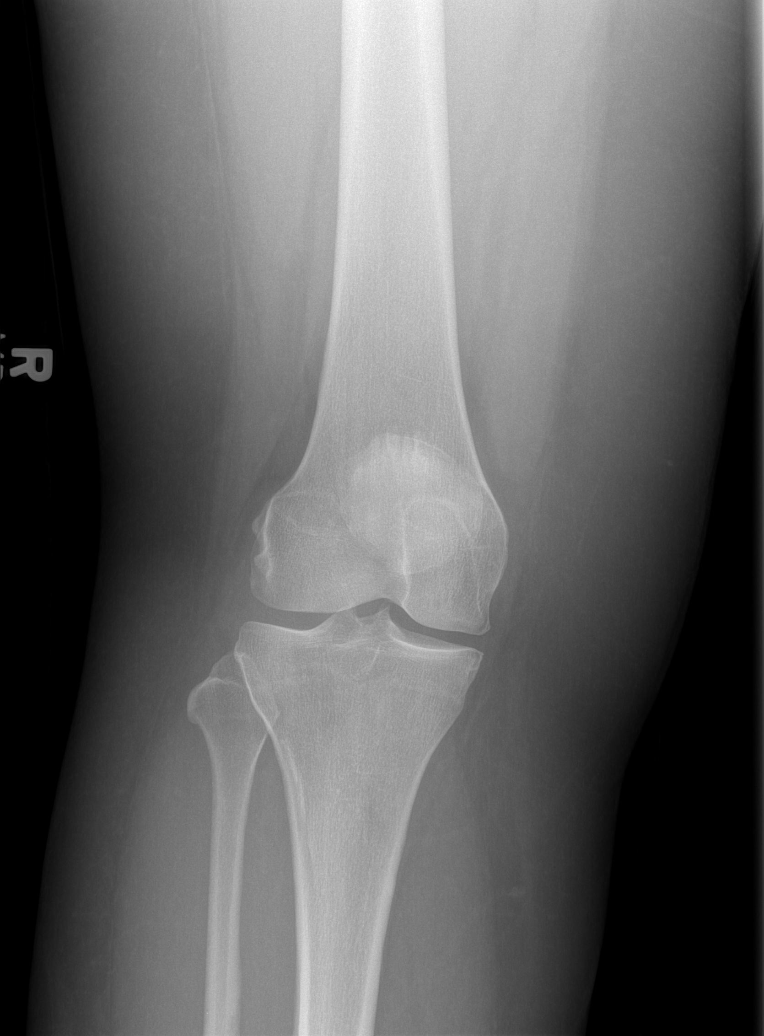

[t knee oblique right (1 of 2)]
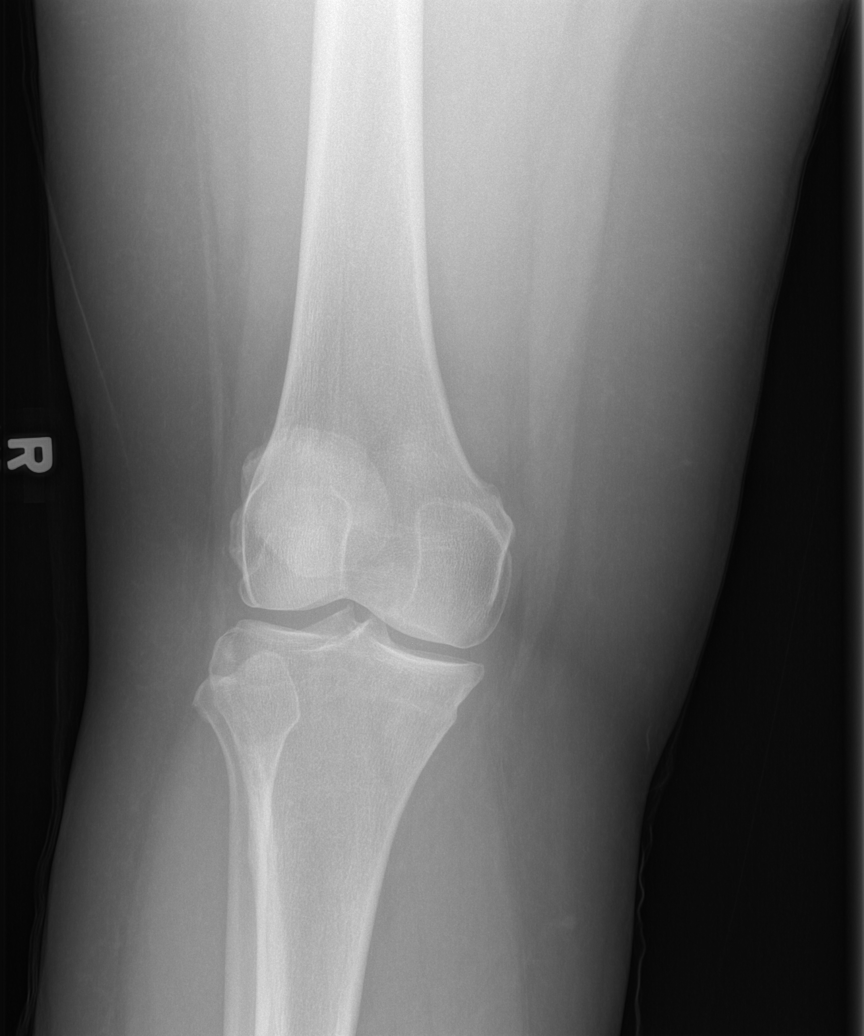

[t knee oblique right (2 of 2)]
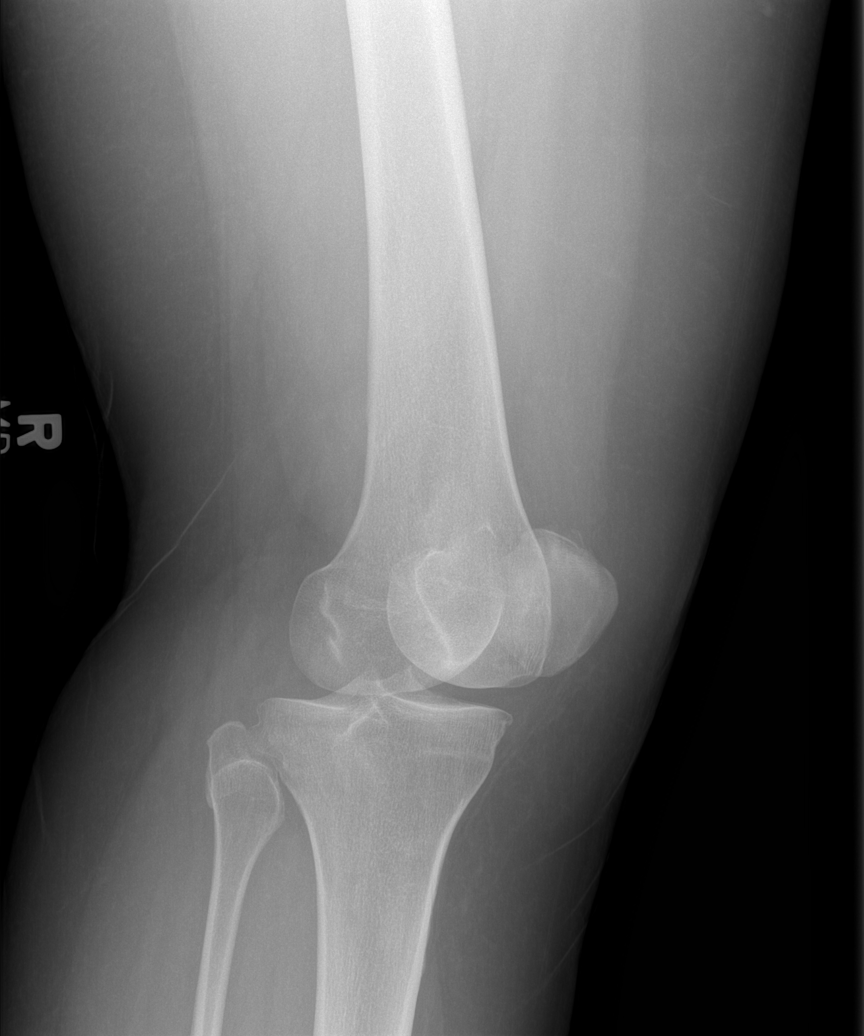

[t knee lat right]
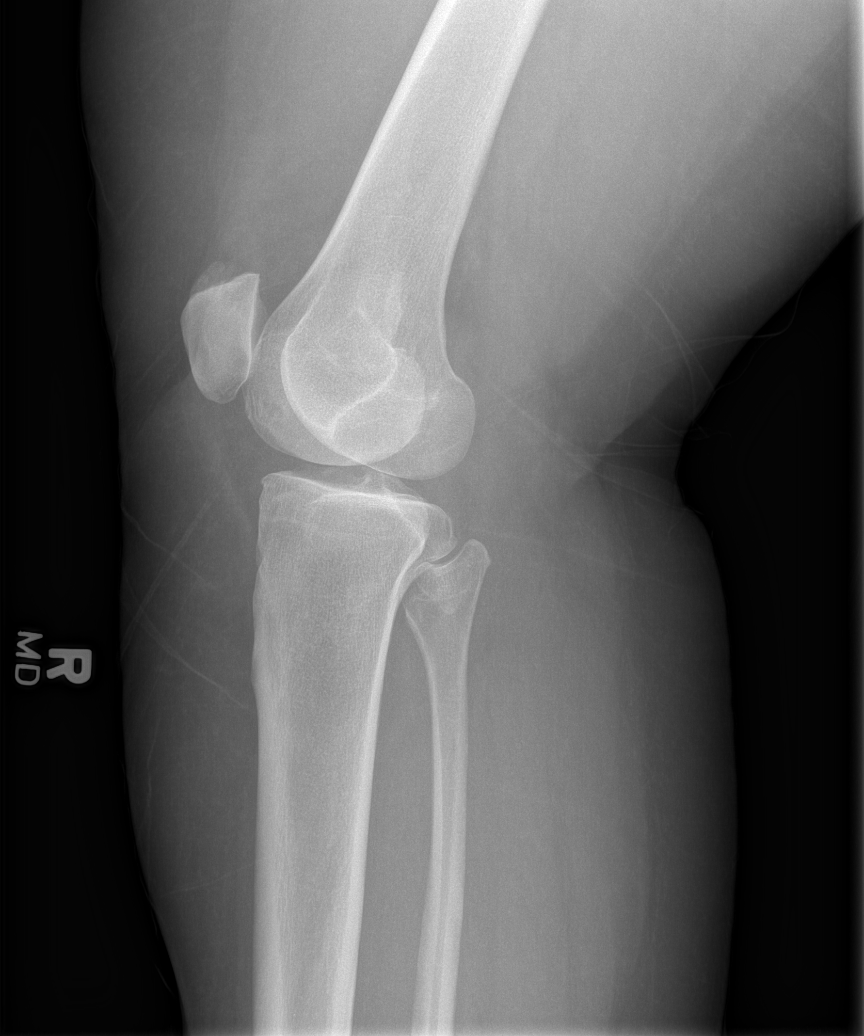

[t knee tunnel view right]
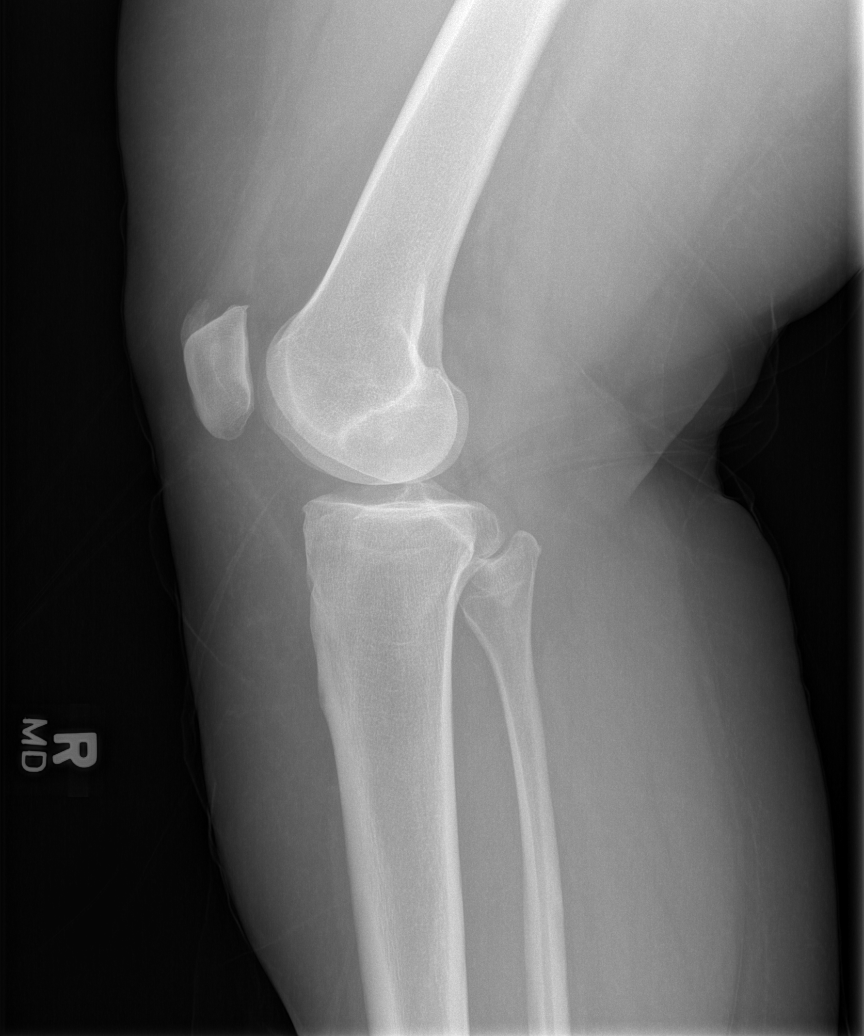

[5 of 5 positions shown; findings below may reference images not displayed]

FINDINGS: Mild patellar spurring.  No joint effusion.  Joint spaces
are preserved. Bone mineralization is within normal limits.  No
acute fracture identified.
IMPRESSION: No acute fracture or dislocation identified about the right knee.

## 2011-04-16 ENCOUNTER — Inpatient Hospital Stay (HOSPITAL_COMMUNITY)
Admission: EM | Admit: 2011-04-16 | Discharge: 2011-04-20 | DRG: 189 | Disposition: A | Discharge status: Home / Self Care | Payer: Medicaid Other | Attending: Internal Medicine | Admitting: Internal Medicine

## 2011-04-16 ENCOUNTER — Emergency Department (HOSPITAL_COMMUNITY): Payer: Medicaid Other

## 2011-04-16 DIAGNOSIS — F411 Generalized anxiety disorder: Secondary | ICD-10-CM | POA: Diagnosis present

## 2011-04-16 DIAGNOSIS — J45901 Unspecified asthma with (acute) exacerbation: Secondary | ICD-10-CM | POA: Diagnosis present

## 2011-04-16 DIAGNOSIS — I10 Essential (primary) hypertension: Secondary | ICD-10-CM | POA: Diagnosis present

## 2011-04-16 DIAGNOSIS — IMO0002 Reserved for concepts with insufficient information to code with codable children: Secondary | ICD-10-CM | POA: Diagnosis present

## 2011-04-16 DIAGNOSIS — F319 Bipolar disorder, unspecified: Secondary | ICD-10-CM | POA: Diagnosis present

## 2011-04-16 DIAGNOSIS — J9600 Acute respiratory failure, unspecified whether with hypoxia or hypercapnia: Principal | ICD-10-CM | POA: Diagnosis present

## 2011-04-16 LAB — BASIC METABOLIC PANEL
BUN: 14 mg/dL (ref 6–23)
CO2: 25 mEq/L (ref 19–32)
Calcium: 9.6 mg/dL (ref 8.4–10.5)
Chloride: 102 mEq/L (ref 96–112)
Creatinine, Ser: 0.73 mg/dL (ref 0.50–1.10)
GFR calc Af Amer: >60 mL/min (ref >60)
GFR calc non Af Amer: >60 mL/min (ref >60)
Glucose, Bld: 114 mg/dL — ABNORMAL HIGH (ref 70–99)
Potassium: 3.9 mEq/L (ref 3.5–5.1)
Sodium: 136 mEq/L (ref 135–145)

## 2011-04-16 LAB — CBC
HCT: 39.6 % (ref 36.0–46.0)
Hemoglobin: 13.6 g/dL (ref 12.0–15.0)
MCH: 30.8 pg (ref 26.0–34.0)
MCHC: 34.3 g/dL (ref 30.0–36.0)
MCV: 89.8 fL (ref 78.0–100.0)
Platelets: 237 10*3/uL (ref 150–400)
RBC: 4.41 MIL/uL (ref 3.87–5.11)
RDW: 13.2 % (ref 11.5–15.5)
WBC: 10.8 10*3/uL — ABNORMAL HIGH (ref 4.0–10.5)

## 2011-04-16 LAB — PRO B NATRIURETIC PEPTIDE: Pro B Natriuretic peptide (BNP): 35.4 pg/mL (ref 0–125)

## 2011-04-17 LAB — BASIC METABOLIC PANEL
BUN: 14 mg/dL (ref 6–23)
CO2: 24 mEq/L (ref 19–32)
Calcium: 9.9 mg/dL (ref 8.4–10.5)
Chloride: 102 mEq/L (ref 96–112)
Creatinine, Ser: 0.61 mg/dL (ref 0.50–1.10)
GFR calc Af Amer: >60 mL/min (ref >60)
GFR calc non Af Amer: >60 mL/min (ref >60)
Glucose, Bld: 181 mg/dL — ABNORMAL HIGH (ref 70–99)
Potassium: 4.4 mEq/L (ref 3.5–5.1)
Sodium: 135 mEq/L (ref 135–145)

## 2011-04-17 LAB — CBC
HCT: 38.8 % (ref 36.0–46.0)
Hemoglobin: 13 g/dL (ref 12.0–15.0)
MCH: 30 pg (ref 26.0–34.0)
MCHC: 33.5 g/dL (ref 30.0–36.0)
MCV: 89.6 fL (ref 78.0–100.0)
Platelets: 283 10*3/uL (ref 150–400)
RBC: 4.33 MIL/uL (ref 3.87–5.11)
RDW: 13.4 % (ref 11.5–15.5)
WBC: 14.2 10*3/uL — ABNORMAL HIGH (ref 4.0–10.5)

## 2011-04-17 LAB — RAPID URINE DRUG SCREEN, HOSP PERFORMED
Amphetamines: NOT DETECTED
Barbiturates: NOT DETECTED
Benzodiazepines: NOT DETECTED
Cocaine: NOT DETECTED
Opiates: NOT DETECTED
Tetrahydrocannabinol: NOT DETECTED

## 2011-04-17 LAB — GLUCOSE, CAPILLARY: Glucose-Capillary: 186 mg/dL — ABNORMAL HIGH (ref 70–99)

## 2011-04-17 NOTE — H&P (Signed)
NAMEANAMARIA, DUSENBURY NO.:  [id number]  MEDICAL RECORD NO.:  [id number]  LOCATION:  5527                         FACILITY:  [facility]  PHYSICIAN:  Tarry Kos, MD       DATE OF BIRTH:  Nov 08, 1970  DATE OF ADMISSION:  04/16/2011 DATE OF DISCHARGE:                             HISTORY & PHYSICAL   CHIEF COMPLAINT:  Shortness of breath, wheezing.  HISTORY OF PRESENT ILLNESS:  [name] is a very pleasant 40 year old African American female who has a longstanding history of asthma since the age of 37, who comes to the emergency department after 4 days of progressive worsening shortness of breath and wheezing.  She has been in the CDU for approximately 9 hours now and has received oral prednisone 60 mg p.o. once along with frequent nebulizer treatments and has not had much improvement.  She says throughout the day she starts to get better and then a couple hours later she starts to wheeze more and becomes tachypneic more.  She used to smoke but quit smoking about 6 months ago, however, her niece has been in her house since last Thursday smoking in the house and this is probably what has triggered her asthma exacerbation.  She has not had any hospitalizations in about 2 years for her asthma.  She denies any fevers.  She denies any nausea or vomiting. She has been having a dry and nonproductive cough.  REVIEW OF SYSTEMS:  Otherwise negative.  PAST MEDICAL HISTORY: 1. She possibly might have obstructive sleep apnea.  She is supposed     to get a sleep study. 2. Asthma since the age of 40. 3. History of cocaine abuse. 4. Hypertension. 5. Anxiety. 6. Chronic back pain. 7. Hypertension. 8. Bipolar disorder.  SOCIAL HISTORY:  She again quit smoking 6 months ago because she gave it up for the lord.  She denies any alcohol or any other drug abuse.  Her pastor is the one who came to her house today and encouraged her come to the hospital as she was having difficulty  breathing.  She states she does not really know why she waited so long to come to the ED.  MEDICATIONS:  She takes the albuterol inhaler.  She also has albuterol nebulizers and she says that her son knocked her nebulizer down several days ago and she thinks her nebulizer might be broken but she is not sure.  She takes Atrovent, gabapentin, Percocet, Lasix for chronic lower extremity edema, Abilify, HCTZ, VESIcare.  ALLERGIES:  She denies.  PHYSICAL EXAMINATION:  VITAL SIGNS:  Her temperature is 98.7, blood pressure is 169/91.  Her pulses have been between 85 and 130 throughout her stay in the ED, respirations 20-24, 100% O2 sats on 2 L nasal cannula. GENERAL:  She is alert.  She is oriented.  She is in some mild respiratory distress and is mildly tachypneic but can speak in full sentences. HEENT:  Extraocular muscles are intact.  Pupils are equal and reactive to light.  Oropharynx is clear.  Mucous membranes moist. NECK:  No JVD.  No carotid bruits. HEART:  Tachycardic, regular rhythm without murmurs, rubs or gallops. CHEST:  She has  got expiratory wheezing at bilateral bases with diminished breath sounds at the bases with otherwise fair air movement. ABDOMEN:  Soft, nontender, nondistended.  Positive bowel sounds.  No hepatosplenomegaly. EXTREMITIES:  No clubbing, cyanosis or edema. PSYCH:  Normal affect.  No focal neurologic deficits. SKIN:  No rashes.  BNP is normal.  BMP is normal with a normal electrolytes.  Normal BUN and creatinine.  White count is 10.8, hemoglobin is 13.6.  Chest x-ray is negative.  The pharmacy tech just came into the room as I am dictating and instructed me that the patient just admitted to her that she has not been taking her inhalers as she is supposed to for quite some time.  ASSESSMENT AND PLAN:  This is a 40 year old female with acute asthma exacerbation. 1. Acute asthma exacerbation.  I am going to place her on Solu-Medrol     IV and  azithromycin and also frequent nebulizers.  Continue her     Advair and provide oxygen supplementation as needed.  She will     likely improve in the next 24-48 hours.  Also provide her some     Mucinex. 2. Tobacco exposure but not currently smoking tobacco.  I have     instructed her that it is not good to be around people who smoke     particularly with her asthma and I congratulated her for quitting     herself. 3. Chronic pain, continue Percocet. 4. History of bipolar disorder.  Continue her medications, Abilify. 5. History of drug abuse.  She denies this at that time but we will     check urine drug screen. 6. The patient is full code.  Further recommendation pending over     hospital course.          ______________________________ Tarry Kos, MD     RD/MEDQ  D:  04/16/2011  T:  04/17/2011  Job:  [id number]  Electronically Signed by Tarry Kos MD on 04/17/2011 12:12:45 PM

## 2011-04-18 LAB — GLUCOSE, CAPILLARY
Glucose-Capillary: 177 mg/dL — ABNORMAL HIGH (ref 70–99)
Glucose-Capillary: 245 mg/dL — ABNORMAL HIGH (ref 70–99)
Glucose-Capillary: 194 mg/dL — ABNORMAL HIGH (ref 70–99)
Glucose-Capillary: 216 mg/dL — ABNORMAL HIGH (ref 70–99)

## 2011-04-19 LAB — GLUCOSE, CAPILLARY
Glucose-Capillary: 146 mg/dL — ABNORMAL HIGH (ref 70–99)
Glucose-Capillary: 186 mg/dL — ABNORMAL HIGH (ref 70–99)
Glucose-Capillary: 191 mg/dL — ABNORMAL HIGH (ref 70–99)
Glucose-Capillary: 169 mg/dL — ABNORMAL HIGH (ref 70–99)

## 2011-04-20 LAB — GLUCOSE, CAPILLARY
Glucose-Capillary: 114 mg/dL — ABNORMAL HIGH (ref 70–99)
Glucose-Capillary: 152 mg/dL — ABNORMAL HIGH (ref 70–99)

## 2011-04-21 ENCOUNTER — Emergency Department (HOSPITAL_COMMUNITY)
Admission: EM | Admit: 2011-04-21 | Discharge: 2011-04-22 | Disposition: A | Discharge status: Home / Self Care | Payer: Medicaid Other | Attending: Emergency Medicine | Admitting: Emergency Medicine

## 2011-04-21 DIAGNOSIS — I10 Essential (primary) hypertension: Secondary | ICD-10-CM | POA: Insufficient documentation

## 2011-04-21 DIAGNOSIS — R0789 Other chest pain: Secondary | ICD-10-CM | POA: Insufficient documentation

## 2011-04-21 DIAGNOSIS — Z79899 Other long term (current) drug therapy: Secondary | ICD-10-CM | POA: Insufficient documentation

## 2011-04-21 DIAGNOSIS — R05 Cough: Secondary | ICD-10-CM | POA: Insufficient documentation

## 2011-04-21 DIAGNOSIS — R0609 Other forms of dyspnea: Secondary | ICD-10-CM | POA: Insufficient documentation

## 2011-04-21 DIAGNOSIS — J45909 Unspecified asthma, uncomplicated: Secondary | ICD-10-CM | POA: Insufficient documentation

## 2011-04-21 DIAGNOSIS — R059 Cough, unspecified: Secondary | ICD-10-CM | POA: Insufficient documentation

## 2011-04-21 DIAGNOSIS — E669 Obesity, unspecified: Secondary | ICD-10-CM | POA: Insufficient documentation

## 2011-04-21 DIAGNOSIS — R0989 Other specified symptoms and signs involving the circulatory and respiratory systems: Secondary | ICD-10-CM | POA: Insufficient documentation

## 2011-04-21 DIAGNOSIS — F411 Generalized anxiety disorder: Secondary | ICD-10-CM | POA: Insufficient documentation

## 2011-04-21 DIAGNOSIS — M129 Arthropathy, unspecified: Secondary | ICD-10-CM | POA: Insufficient documentation

## 2011-04-21 DIAGNOSIS — R0602 Shortness of breath: Secondary | ICD-10-CM | POA: Insufficient documentation

## 2011-05-20 NOTE — Discharge Summary (Signed)
NAMELARESHA, BACORN NO.:  [id number]  MEDICAL RECORD NO.:  [id number]  LOCATION:  5527                         FACILITY:  [facility]  PHYSICIAN:  Lori Bean, M.D.   DATE OF BIRTH:  [birth year]  DATE OF ADMISSION:  04/16/2011 DATE OF DISCHARGE:  04/20/2011                              DISCHARGE SUMMARY   PRIMARY CARE PHYSICIAN:  Lori Livings, MD at the [facility].  DISCHARGE DIAGNOSES: 1. Acute respiratory failure secondary to asthma exacerbation. 2. Tobacco exposure, but not currently smoking. 3. Hypertension. 4. Acute headache. 5. Chronic pain. 6. History of bipolar disorder. 7. History of drug abuse. 8. History of anxiety.  HISTORY AND BRIEF HOSPITAL COURSE:  [name] is a pleasant 40 year old Africanrican American female who has a longstanding history of asthma.  She came to the emergency department after 4 days of progressive shortness of breath and wheezing.  She was treated with Solu-Medrol IV and nebulizer treatments.  She was also put on azithromycin.  On the morning of April 17, 2011, the patient had some improvement in her breathing with Advair, steroids, nebulizers, and antibiotics.  She discussed with me that she was homeless and had just been able to move into a new place this week.  Consequently, her stress level at home with high. Spirometry was ordered for her to be able to work with and track her progressed.  She was able to ambulate in the hallways without any shortness of breath and maintain an O2 saturation of 96%.  On April 18, 2011, the patient still was having only small improvement.  Her nebulizer treatments were increased and on April 19, 2011, the patient report that she still felt badly; however, she appeared to be clinically improved.  This morning on April 20, 2011, I walking the patient just dressed in her street clothes, she is sitting on the side of bed.  She has no accessory muscle use or wheezing.  She tells me she  is ready for discharge to home.  PHYSICAL EXAMINATION:  GENERAL:  The patient is alert and oriented.  Her demeanor is pleasant and cooperative. VITAL SIGNS:  Temperature 98.2, pulse 67, respirations 18, blood pressure 150/84, O2 sats 100% on 2 liters. HEENT:  Head is atraumatic and normocephalic.  Eyes are anicteric with pupils are equal and round.  Nose shows no nasal discharge or exterior lesions.  Mouth has moist mucous membranes with moderate dentition. NECK:  Supple with midline trachea.  No JVD.  No lymphadenopathy. CHEST:  Demonstrates no accessory muscle use.  She has no wheezes or crackles to my exam. HEART:  Her heart has a regular rate and rhythm without obvious murmurs, rubs, or gallops. ABDOMEN:  Obese, nontender, and nondistended with good bowel sounds. EXTREMITIES:  Show no clubbing, cyanosis, or edema. NEUROLOGIC:  She has no focal deficits.  Cranial nerves II through XII appear to be grossly intact.  She is able to move all four extremities without difficulty. PSYCHIATRIC:  The patient is alert and oriented.  Demeanor is pleasant and cooperative.  Grooming is good.  Laboratory studies on April 17, 2011 are pertinent for white count that is climbing as she is on steroids 14.2,  hemoglobin 13, hematocrit 38.8, platelets 283.  CBG use during this admission ranged between 114 and 245.  They went up as expected on steroids.  Sodium on April 17, 2011 135, potassium 4.4, chloride 104, bicarb 24, glucose 181, BUN 14, creatinine 0.61.  Urinary drug screen was done and found to be negative. Pertinent radiological exams; the patient had a two-view chest x-ray on April 16, 2011, that was a stable exam with no acute finding.  She did not have any ancillary studies done this admission.  DISCHARGE MEDICATIONS: 1. Prednisone 10 mg she will be on a taper, four tablets for 2 days,     three tablets for 2 days, two tablets for 2 days, then one tablet     for 2 days, then stop. 2.  Abilify 5 mg one tablet by mouth daily. 3. Advair Diskus 250/50 one puff every 12 hours, takes for 30 days. 4. Albuterol nebulizer one vial every 6 hours as needed for shortness     of breath. 5. Diazepam 2 mg one tablet every 6 hours as needed for anxiety. 6. Diclofenac 75 mg one tablet by mouth twice daily. 7. Furosemide 40 mg one tablet by mouth daily, take for 30 days. 8. Gabapentin 300 mg one capsule by mouth twice daily. 9. Hydrochlorothiazide 12.5 mg one capsule by mouth daily. 10.Nexium 20 mg one capsule by mouth daily. 11.Percocet 10/325 one tablet by mouth every 6 hours as needed for     pain. 12.Albuterol 90 mcg inhaler two puffs as needed four times a day     p.r.n. wheeze. 13.VESIcare 5 mg one tablet by mouth every morning.  DISCHARGE INSTRUCTIONS:  The patient will be discharged to home.  She is to increase her activity slowly.  Diet will be low-sodium heart-healthy  FOLLOWUP APPOINTMENTS: 1. She will see Dr. Quitman Bean on April 28, 2011 at the [facility] at 1 p.m.  Suggestions for outpatient followup.  The     patient's blood pressure medications have been adjusted, please     review these. 2. The patient came to the hospital for asthma exacerbation.  Please     monitor her respiratory status and medications.     Lori Police, PA   ______________________________ Lori Bean, M.D.    MLY/MEDQ  D:  04/20/2011  T:  04/21/2011  Job:  [id number]  cc:   Lori Livings, MD  Electronically Signed by Lori Downs PA on 04/21/2011 04:54:09 PM Electronically Signed by Lori Bean M.D. on 05/20/2011 10:21:09 PM

## 2011-06-04 LAB — URINALYSIS, ROUTINE W REFLEX MICROSCOPIC
Bilirubin Urine: NEGATIVE
Glucose, UA: NEGATIVE
Hgb urine dipstick: NEGATIVE
Ketones, ur: NEGATIVE
Nitrite: NEGATIVE
Protein, ur: NEGATIVE
Specific Gravity, Urine: >1.03 — ABNORMAL HIGH
Urobilinogen, UA: 0.2
pH: 5

## 2011-06-04 LAB — CBC
HCT: 39.5
Hemoglobin: 13.7
MCHC: 34.8
MCV: 90.5
Platelets: 267
RBC: 4.37
RDW: 13.4
WBC: 12 — ABNORMAL HIGH

## 2011-06-04 LAB — COMPREHENSIVE METABOLIC PANEL
ALT: 24
AST: 11
Albumin: 3.5
Alkaline Phosphatase: 71
BUN: 13
CO2: 26
Calcium: 9.4
Chloride: 101
Creatinine, Ser: 0.84
GFR calc Af Amer: >60
GFR calc non Af Amer: >60
Glucose, Bld: 102 — ABNORMAL HIGH
Potassium: 4.3
Sodium: 136
Total Bilirubin: 0.7
Total Protein: 7

## 2011-06-04 LAB — AMYLASE: Amylase: 37

## 2011-06-04 LAB — LIPASE, BLOOD: Lipase: 22

## 2011-06-04 LAB — POCT PREGNANCY, URINE
Operator id: 20265
Preg Test, Ur: NEGATIVE

## 2011-06-05 LAB — URINE CULTURE: Colony Count: 30000

## 2011-06-05 LAB — COMPREHENSIVE METABOLIC PANEL
ALT: 16
ALT: 16
AST: 10
AST: 10
Albumin: 2.8 — ABNORMAL LOW
Albumin: 3.3 — ABNORMAL LOW
Alkaline Phosphatase: 47
Alkaline Phosphatase: 55
BUN: 11
BUN: 7
CO2: 26
CO2: 27
Calcium: 8.5
Calcium: 9.3
Chloride: 104
Chloride: 107
Creatinine, Ser: 0.75
Creatinine, Ser: 0.77
GFR calc Af Amer: >60
GFR calc Af Amer: >60
GFR calc non Af Amer: >60
GFR calc non Af Amer: >60
Glucose, Bld: 115 — ABNORMAL HIGH
Glucose, Bld: 122 — ABNORMAL HIGH
Potassium: 4.1
Potassium: 4.5
Sodium: 135
Sodium: 138
Total Bilirubin: 0.5
Total Bilirubin: 0.5
Total Protein: 5.7 — ABNORMAL LOW
Total Protein: 6.6

## 2011-06-05 LAB — DIFFERENTIAL
Basophils Absolute: 0.2 — ABNORMAL HIGH
Basophils Relative: 2 — ABNORMAL HIGH
Eosinophils Absolute: 0.3
Eosinophils Relative: 4
Lymphocytes Relative: 26
Lymphs Abs: 2.4
Monocytes Absolute: 0.7
Monocytes Relative: 7
Neutro Abs: 5.7
Neutrophils Relative %: 62

## 2011-06-05 LAB — CBC
HCT: 36.2
HCT: 39
Hemoglobin: 12.2
Hemoglobin: 13.2
MCHC: 33.6
MCHC: 33.8
MCV: 90.8
MCV: 91.5
Platelets: 242
Platelets: 291
RBC: 3.95
RBC: 4.29
RDW: 13.1
RDW: 13.8
WBC: 8.7
WBC: 9.2

## 2011-06-05 LAB — URINALYSIS, ROUTINE W REFLEX MICROSCOPIC
Bilirubin Urine: NEGATIVE
Glucose, UA: NEGATIVE
Hgb urine dipstick: NEGATIVE
Ketones, ur: NEGATIVE
Nitrite: NEGATIVE
Protein, ur: NEGATIVE
Specific Gravity, Urine: 1.019
Urobilinogen, UA: 0.2
pH: 5

## 2011-06-05 LAB — LIPASE, BLOOD: Lipase: 31

## 2011-06-05 LAB — URINE MICROSCOPIC-ADD ON

## 2011-06-08 ENCOUNTER — Emergency Department (HOSPITAL_COMMUNITY): Payer: Medicaid Other

## 2011-06-08 ENCOUNTER — Emergency Department (HOSPITAL_COMMUNITY)
Admission: EM | Admit: 2011-06-08 | Discharge: 2011-06-08 | Disposition: A | Discharge status: Home / Self Care | Payer: Medicaid Other | Attending: Emergency Medicine | Admitting: Emergency Medicine

## 2011-06-08 DIAGNOSIS — Z79899 Other long term (current) drug therapy: Secondary | ICD-10-CM | POA: Insufficient documentation

## 2011-06-08 DIAGNOSIS — G8929 Other chronic pain: Secondary | ICD-10-CM | POA: Insufficient documentation

## 2011-06-08 DIAGNOSIS — M545 Low back pain, unspecified: Secondary | ICD-10-CM | POA: Insufficient documentation

## 2011-06-08 DIAGNOSIS — R079 Chest pain, unspecified: Secondary | ICD-10-CM | POA: Insufficient documentation

## 2011-06-08 DIAGNOSIS — I10 Essential (primary) hypertension: Secondary | ICD-10-CM | POA: Insufficient documentation

## 2011-06-08 LAB — DIFFERENTIAL
Basophils Absolute: 0 10*3/uL (ref 0.0–0.1)
Basophils Relative: 0 % (ref 0–1)
Eosinophils Absolute: 0.6 10*3/uL (ref 0.0–0.7)
Eosinophils Relative: 5 % (ref 0–5)
Lymphocytes Relative: 28 % (ref 12–46)
Lymphs Abs: 3.3 10*3/uL (ref 0.7–4.0)
Monocytes Absolute: 1 10*3/uL (ref 0.1–1.0)
Monocytes Relative: 9 % (ref 3–12)
Neutro Abs: 6.7 10*3/uL (ref 1.7–7.7)
Neutrophils Relative %: 58 % (ref 43–77)

## 2011-06-08 LAB — POCT I-STAT TROPONIN I: Troponin i, poc: 0.01 ng/mL (ref 0.00–0.08)

## 2011-06-08 LAB — POCT I-STAT, CHEM 8
BUN: 18 mg/dL (ref 6–23)
Calcium, Ion: 1.19 mmol/L (ref 1.12–1.32)
Chloride: 105 mEq/L (ref 96–112)
Creatinine, Ser: 1.2 mg/dL — ABNORMAL HIGH (ref 0.50–1.10)
Glucose, Bld: 93 mg/dL (ref 70–99)
HCT: 40 % (ref 36.0–46.0)
Hemoglobin: 13.6 g/dL (ref 12.0–15.0)
Potassium: 3.8 mEq/L (ref 3.5–5.1)
Sodium: 138 mEq/L (ref 135–145)
TCO2: 24 mmol/L (ref 0–100)

## 2011-06-08 LAB — CBC
HCT: 38 % (ref 36.0–46.0)
Hemoglobin: 12.8 g/dL (ref 12.0–15.0)
MCH: 30.8 pg (ref 26.0–34.0)
MCHC: 33.7 g/dL (ref 30.0–36.0)
MCV: 91.3 fL (ref 78.0–100.0)
Platelets: 254 10*3/uL (ref 150–400)
RBC: 4.16 MIL/uL (ref 3.87–5.11)
RDW: 14 % (ref 11.5–15.5)
WBC: 11.7 10*3/uL — ABNORMAL HIGH (ref 4.0–10.5)

## 2011-06-12 ENCOUNTER — Emergency Department (HOSPITAL_COMMUNITY): Payer: Medicaid Other

## 2011-06-12 ENCOUNTER — Emergency Department (HOSPITAL_COMMUNITY)
Admission: EM | Admit: 2011-06-12 | Discharge: 2011-06-12 | Disposition: A | Discharge status: Home / Self Care | Payer: Medicaid Other | Attending: Emergency Medicine | Admitting: Emergency Medicine

## 2011-06-12 DIAGNOSIS — S8010XA Contusion of unspecified lower leg, initial encounter: Secondary | ICD-10-CM | POA: Insufficient documentation

## 2011-06-12 DIAGNOSIS — R296 Repeated falls: Secondary | ICD-10-CM | POA: Insufficient documentation

## 2011-06-12 DIAGNOSIS — J45909 Unspecified asthma, uncomplicated: Secondary | ICD-10-CM | POA: Insufficient documentation

## 2011-06-12 DIAGNOSIS — IMO0002 Reserved for concepts with insufficient information to code with codable children: Secondary | ICD-10-CM | POA: Insufficient documentation

## 2011-06-12 DIAGNOSIS — M79609 Pain in unspecified limb: Secondary | ICD-10-CM | POA: Insufficient documentation

## 2011-06-12 DIAGNOSIS — Y92009 Unspecified place in unspecified non-institutional (private) residence as the place of occurrence of the external cause: Secondary | ICD-10-CM | POA: Insufficient documentation

## 2011-06-12 DIAGNOSIS — I10 Essential (primary) hypertension: Secondary | ICD-10-CM | POA: Insufficient documentation

## 2011-06-12 LAB — URINALYSIS, ROUTINE W REFLEX MICROSCOPIC
Bilirubin Urine: NEGATIVE
Glucose, UA: NEGATIVE
Hgb urine dipstick: NEGATIVE
Ketones, ur: NEGATIVE
Nitrite: NEGATIVE
Protein, ur: NEGATIVE
Specific Gravity, Urine: 1.029
Urobilinogen, UA: 0.2
pH: 5.5

## 2011-06-12 LAB — POCT PREGNANCY, URINE
Operator id: 29011
Preg Test, Ur: NEGATIVE

## 2011-06-15 LAB — URINE MICROSCOPIC-ADD ON

## 2011-06-15 LAB — URINALYSIS, ROUTINE W REFLEX MICROSCOPIC
Bilirubin Urine: NEGATIVE
Glucose, UA: NEGATIVE
Hgb urine dipstick: NEGATIVE
Ketones, ur: 15 — AB
Nitrite: NEGATIVE
Protein, ur: 30 — AB
Specific Gravity, Urine: 1.04 — ABNORMAL HIGH
Urobilinogen, UA: 1
pH: 6

## 2011-06-15 LAB — WET PREP, GENITAL: Yeast Wet Prep HPF POC: NONE SEEN

## 2011-06-15 LAB — GC/CHLAMYDIA PROBE AMP, GENITAL
Chlamydia, DNA Probe: NEGATIVE
GC Probe Amp, Genital: NEGATIVE

## 2011-06-15 LAB — PREGNANCY, URINE: Preg Test, Ur: NEGATIVE

## 2011-06-15 LAB — RPR: RPR Ser Ql: NONREACTIVE

## 2011-06-16 LAB — POCT I-STAT, CHEM 8
BUN: 13
Calcium, Ion: 1.28
Chloride: 102
Creatinine, Ser: 1
Glucose, Bld: 103 — ABNORMAL HIGH
HCT: 37
Hemoglobin: 12.6
Potassium: 4
Sodium: 139
TCO2: 29

## 2011-06-16 LAB — URINE MICROSCOPIC-ADD ON

## 2011-06-16 LAB — URINALYSIS, ROUTINE W REFLEX MICROSCOPIC
Bilirubin Urine: NEGATIVE
Glucose, UA: NEGATIVE
Hgb urine dipstick: NEGATIVE
Ketones, ur: NEGATIVE
Nitrite: NEGATIVE
Protein, ur: NEGATIVE
Specific Gravity, Urine: 1.024
Urobilinogen, UA: 0.2
pH: 6

## 2011-06-29 LAB — BASIC METABOLIC PANEL
BUN: 15
CO2: 23
Calcium: 9.6
Chloride: 103
Creatinine, Ser: 1.03
GFR calc Af Amer: >60
GFR calc non Af Amer: >60
Glucose, Bld: 107 — ABNORMAL HIGH
Potassium: 3.5
Sodium: 137

## 2011-06-29 LAB — DIFFERENTIAL
Basophils Absolute: 0
Basophils Relative: 0
Eosinophils Absolute: 0.2
Eosinophils Relative: 2
Lymphocytes Relative: 17
Lymphs Abs: 2
Monocytes Absolute: 0.9 — ABNORMAL HIGH
Monocytes Relative: 8
Neutro Abs: 8.8 — ABNORMAL HIGH
Neutrophils Relative %: 74

## 2011-06-29 LAB — CBC
HCT: 37.6
Hemoglobin: 12.8
MCHC: 34.1
MCV: 90.3
Platelets: 278
RBC: 4.16
RDW: 13.3
WBC: 11.9 — ABNORMAL HIGH

## 2011-06-29 LAB — B-NATRIURETIC PEPTIDE (CONVERTED LAB): Pro B Natriuretic peptide (BNP): <30

## 2011-07-02 LAB — POCT CARDIAC MARKERS
CKMB, poc: 1.2
Myoglobin, poc: 46.3
Operator id: 4761
Troponin i, poc: <0.05

## 2011-07-02 LAB — COMPREHENSIVE METABOLIC PANEL
ALT: 15
AST: 12
Albumin: 3.4 — ABNORMAL LOW
Alkaline Phosphatase: 60
BUN: 18
CO2: 28
Calcium: 9.6
Chloride: 103
Creatinine, Ser: 0.9
GFR calc Af Amer: >60
GFR calc non Af Amer: >60
Glucose, Bld: 106 — ABNORMAL HIGH
Potassium: 3.9
Sodium: 139
Total Bilirubin: 0.4
Total Protein: 6.8

## 2011-07-02 LAB — CBC
HCT: 41.8
Hemoglobin: 14.2
MCHC: 34
MCV: 90.8
Platelets: 289
RBC: 4.6
RDW: 13.2
WBC: 12.3 — ABNORMAL HIGH

## 2011-07-02 LAB — DIFFERENTIAL
Basophils Absolute: 0
Basophils Relative: 0
Eosinophils Absolute: 0.6
Eosinophils Relative: 5
Lymphocytes Relative: 25
Lymphs Abs: 3.1
Monocytes Absolute: 1.3 — ABNORMAL HIGH
Monocytes Relative: 10
Neutro Abs: 7.3
Neutrophils Relative %: 59

## 2011-07-02 LAB — URINALYSIS, ROUTINE W REFLEX MICROSCOPIC
Bilirubin Urine: NEGATIVE
Glucose, UA: NEGATIVE
Hgb urine dipstick: NEGATIVE
Ketones, ur: NEGATIVE
Nitrite: NEGATIVE
Protein, ur: NEGATIVE
Specific Gravity, Urine: 1.014
Urobilinogen, UA: 0.2
pH: 6.5

## 2011-07-02 LAB — URINE CULTURE: Colony Count: 25000

## 2011-07-02 LAB — RAPID URINE DRUG SCREEN, HOSP PERFORMED
Amphetamines: NOT DETECTED
Barbiturates: NOT DETECTED
Benzodiazepines: NOT DETECTED
Cocaine: POSITIVE — AB
Opiates: NOT DETECTED
Tetrahydrocannabinol: POSITIVE — AB

## 2011-07-02 LAB — URINE MICROSCOPIC-ADD ON

## 2011-07-02 LAB — D-DIMER, QUANTITATIVE: D-Dimer, Quant: 0.28

## 2011-08-25 IMAGING — CT CT PELVIS W/ CM
2 of 4 series · 17 of 46 positions shown, 19 images · IV contrast (APPLIED)
Comparison: 04/06/2006

CT ABDOMEN

CLINICAL DATA: Diffuse abdominal pain.  Some vomiting.

CT ABDOMEN AND PELVIS WITH CONTRAST
TECHNIQUE: Multidetector CT imaging of the abdomen and pelvis was
performed using the standard protocol following bolus
administration of intravenous contrast.
Contrast: 125 ml Ymnipaque-7CC

[Series 2: abd_pel 5.0 b40f st · axial · 0.86mm/px · z∈[-513,-108]mm · 14 of 89 slices shown, 16 images]
[im 4/89  soft-tissue]
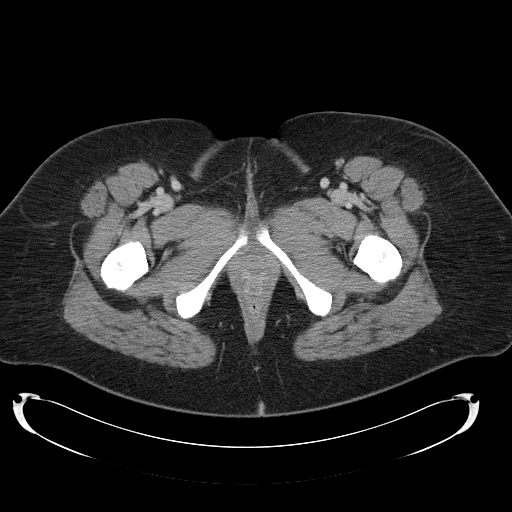
[im 4/89  bone]
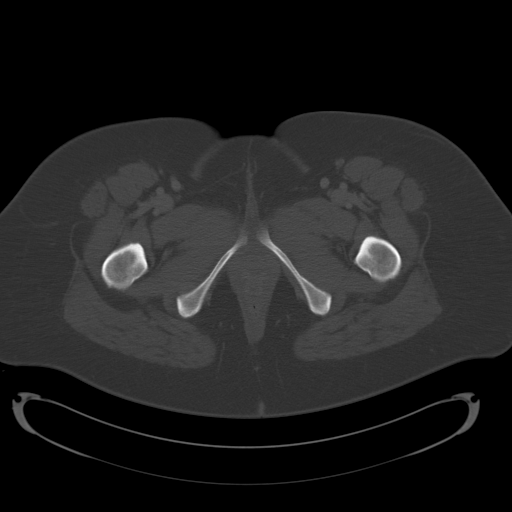
[im 12/89  soft-tissue]
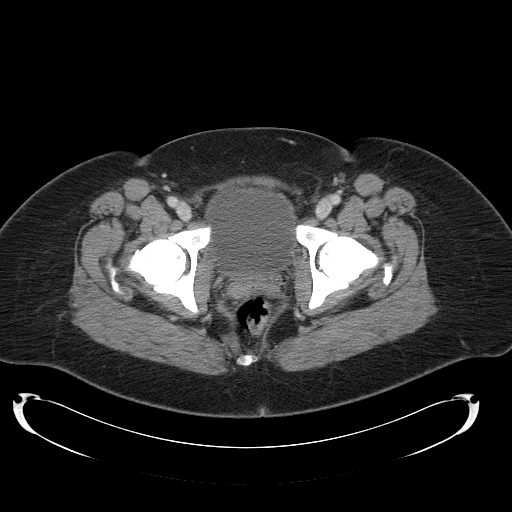
[im 19/89  soft-tissue]
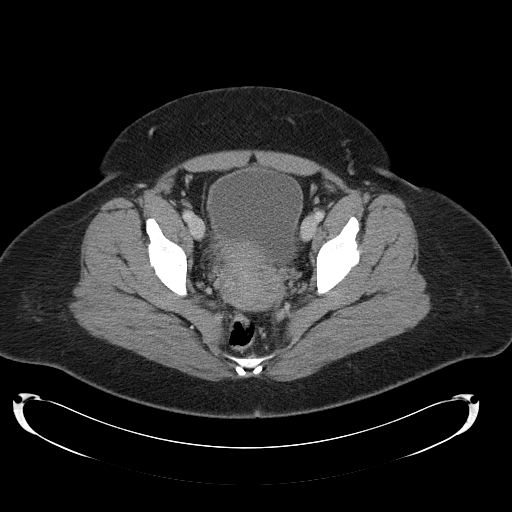
[im 23/89  soft-tissue]
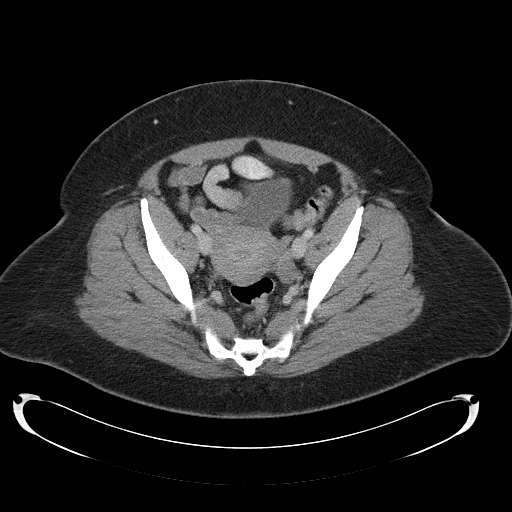
[im 30/89  soft-tissue]
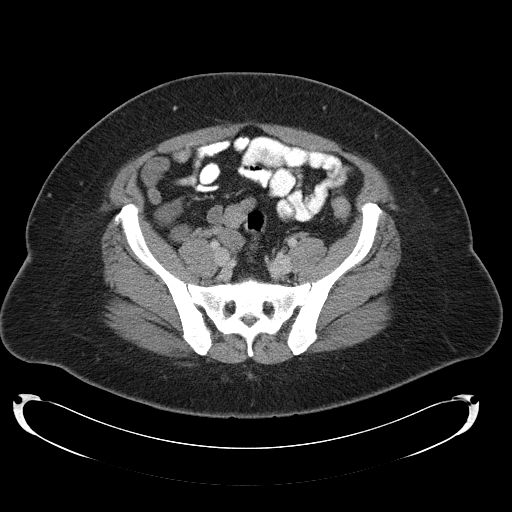
[im 37/89  soft-tissue]
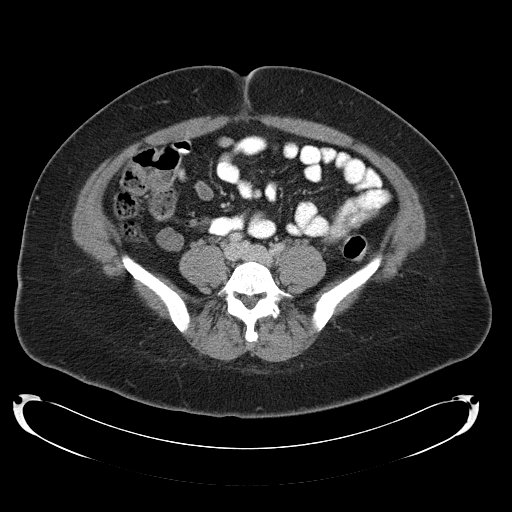
[im 41/89  soft-tissue]
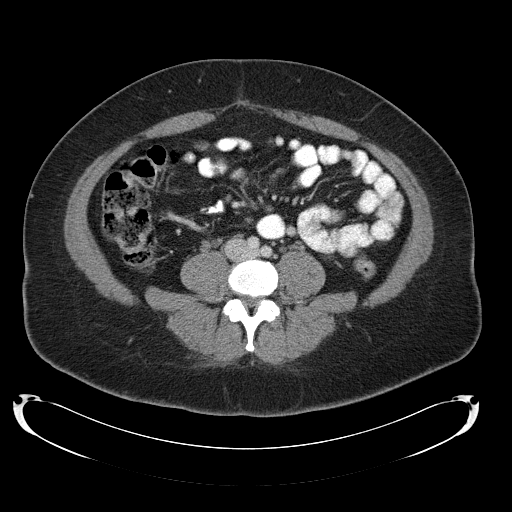
[im 48/89  soft-tissue]
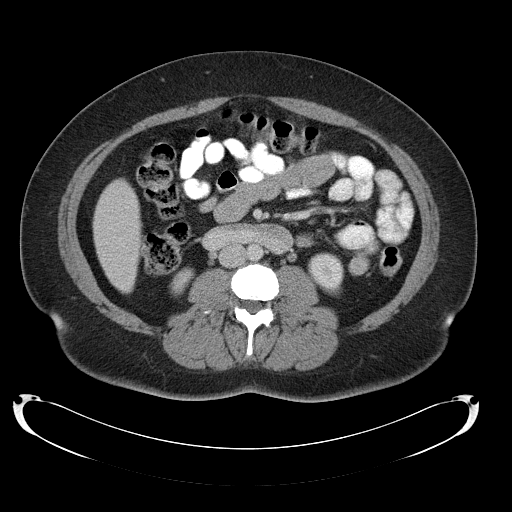
[im 52/89  soft-tissue]
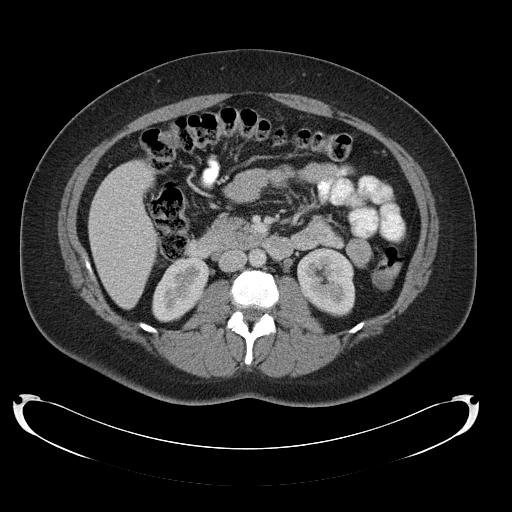
[im 52/89  bone]
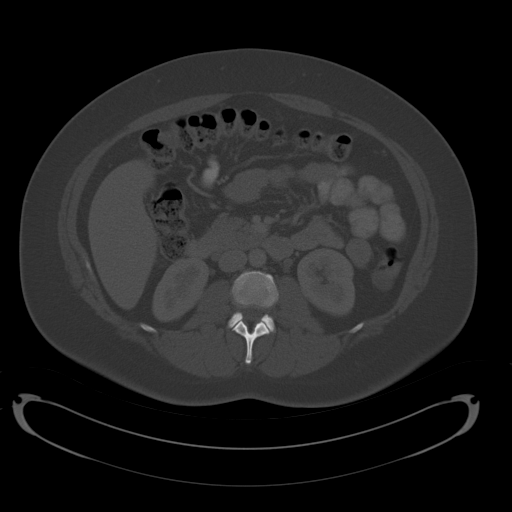
[im 59/89  soft-tissue]
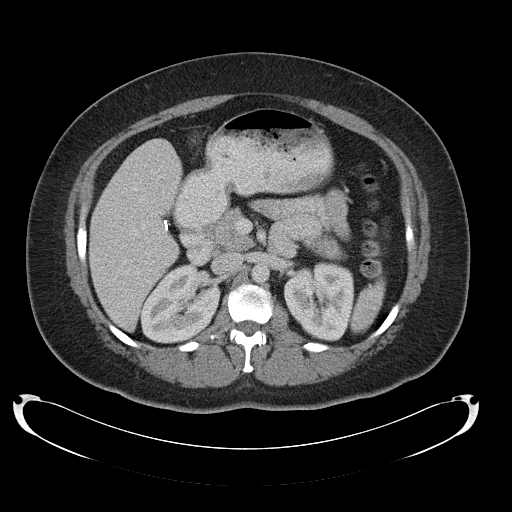
[im 67/89  soft-tissue]
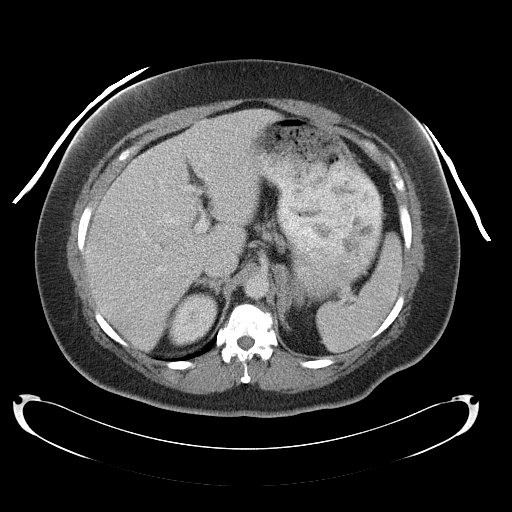
[im 70/89  soft-tissue]
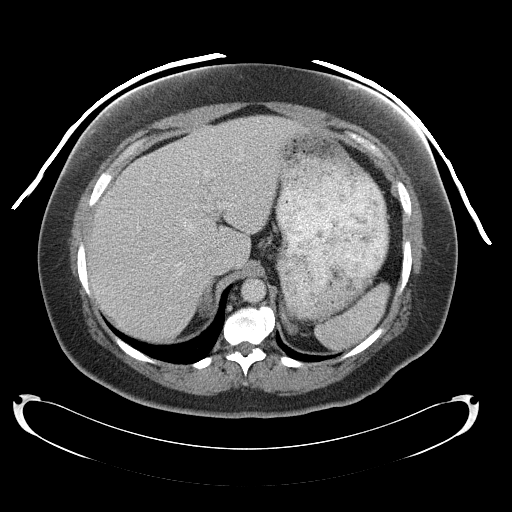
[im 78/89  soft-tissue]
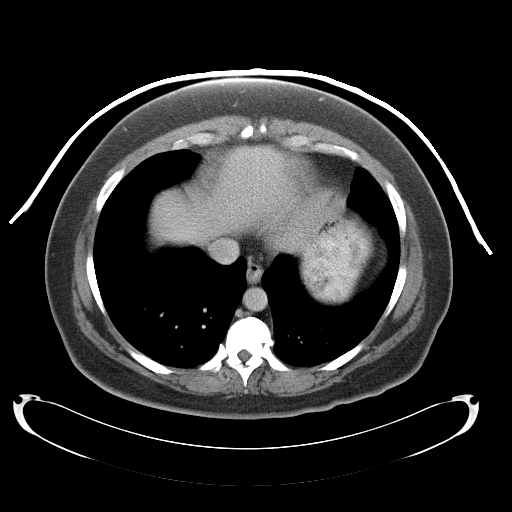
[im 85/89  soft-tissue]
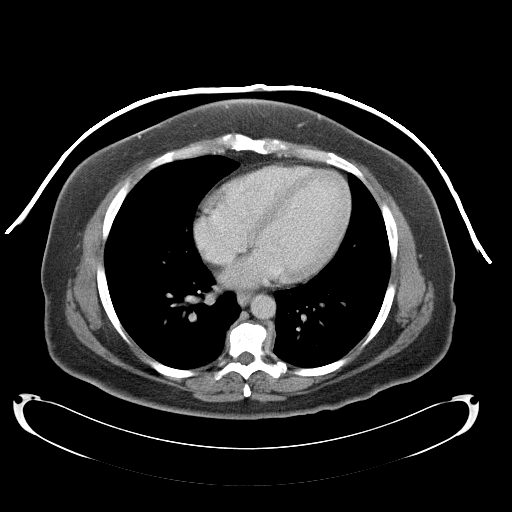

[Series 602: c oronal · coronal · 0.90mm/px · 3 of 85 slices shown]
[im 29/85  soft-tissue]
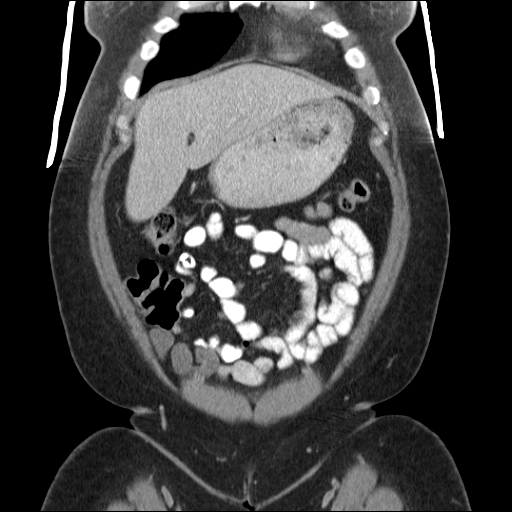
[im 38/85  soft-tissue]
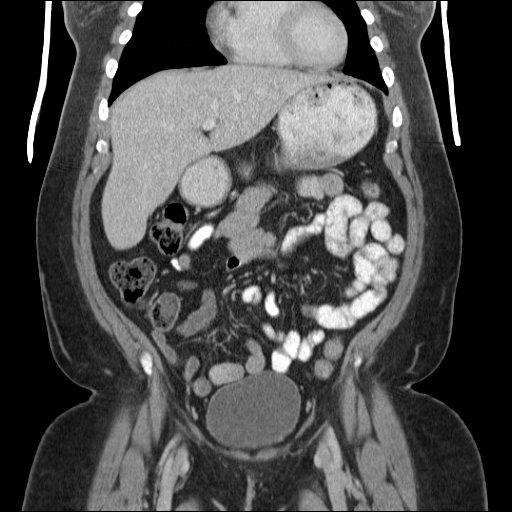
[im 47/85  soft-tissue]
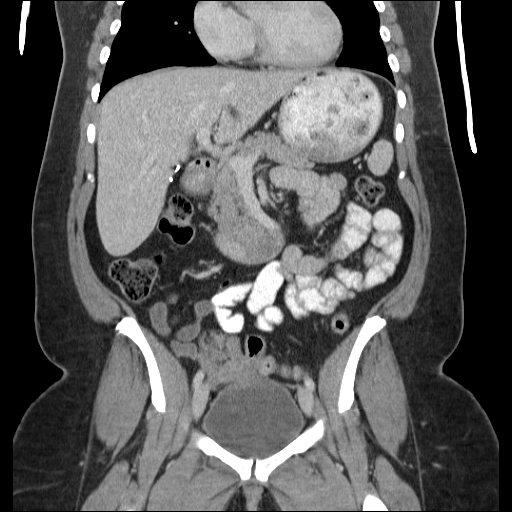

[17 of 46 positions shown; findings below may reference images not displayed]

FINDINGS: No focal abnormalities seen in the liver or spleen.  The
stomach is distended with food and contrast.  The duodenum,
pancreas, and kidneys are unremarkable.  Gallbladder is surgically
absent.  Thickening of the adrenal glands is stable and suggests
hyperplasia.

No abdominal aortic aneurysm.  No intraperitoneal free fluid.  The
abdominal bowel loops are normal in appearance.  No
lymphadenopathy.
IMPRESSION: Stable exam.  No new or acute findings in the abdomen.

CT PELVIS
FINDINGS: No intraperitoneal free fluid.  No pelvic sidewall
lymphadenopathy.  Bladder is unremarkable.  Uterus has normal
imaging features.  No evidence for an adnexal mass.

No evidence for diverticulosis.  No diverticulitis.  The terminal
ileum is normal.  The appendix is normal.

Bone windows reveal no worrisome lytic or sclerotic osseous
lesions.
IMPRESSION: Unremarkable CT scan of the pelvis.  No acute findings.

## 2011-09-03 IMAGING — CR DG CHEST 2V
2 series · 2 of 2 positions shown · non-contrast
Comparison: 02/02/2009

CLINICAL DATA: Cough.  Short of breath.

CHEST - 2 VIEW

[w chest pa]
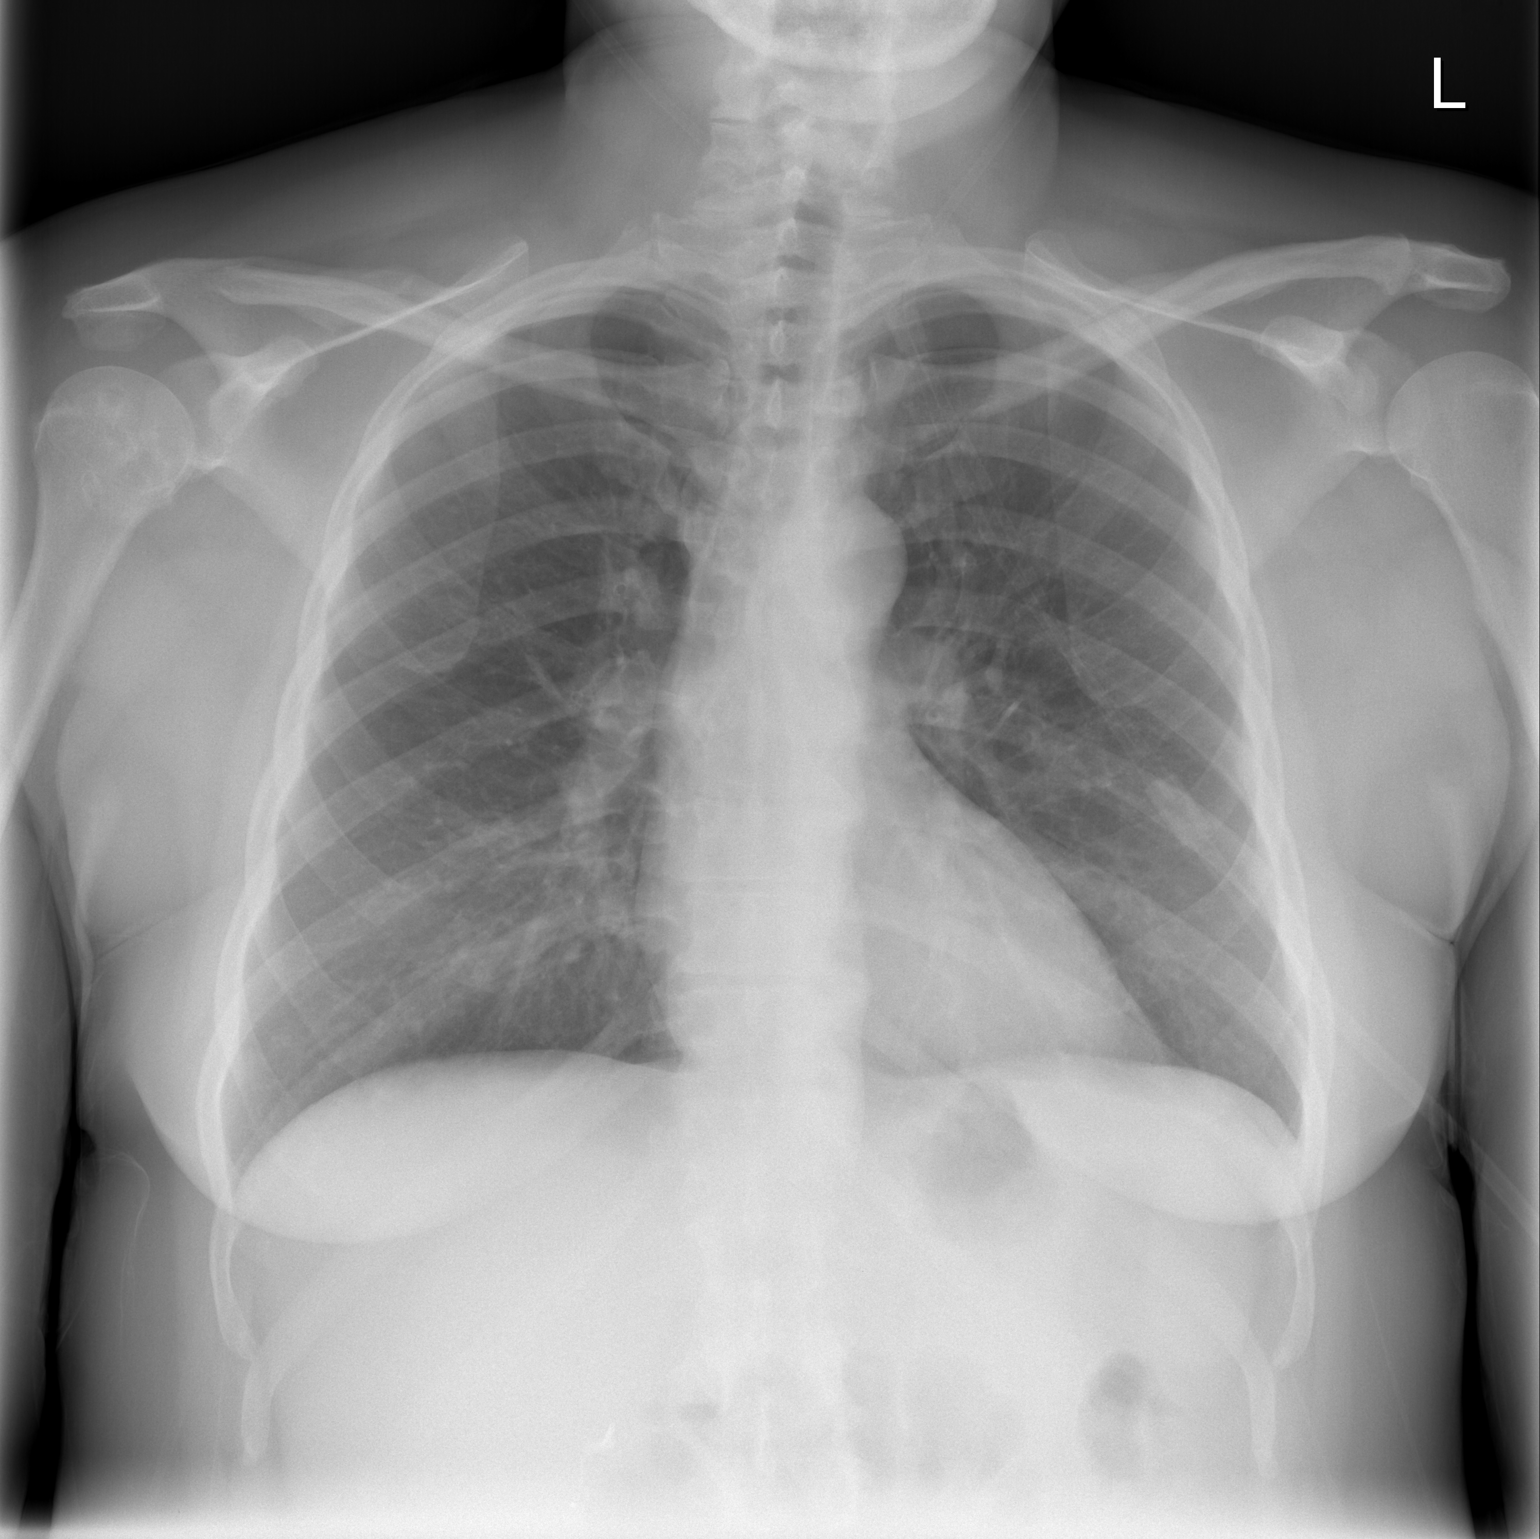

[w chest lat]
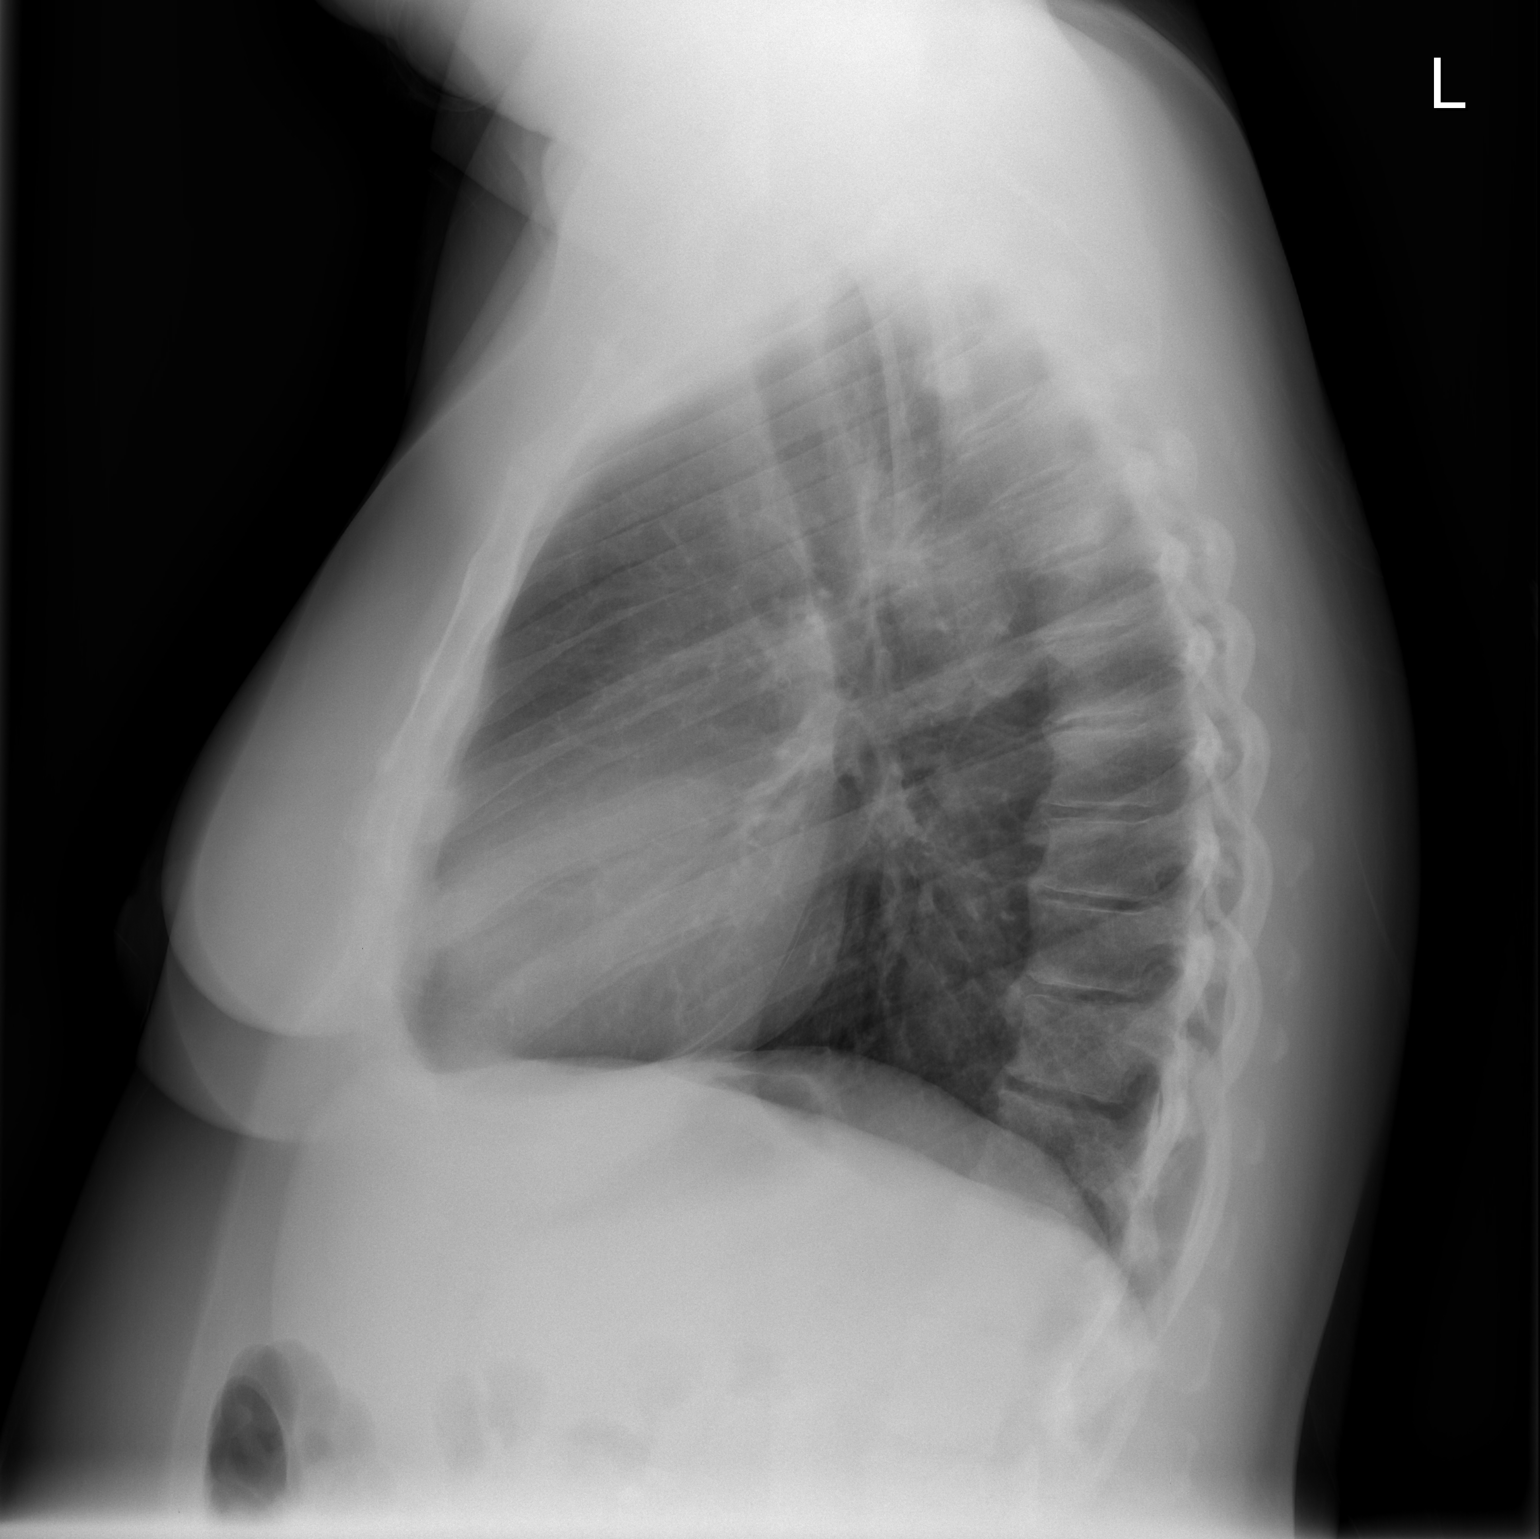

[2 of 2 positions shown; findings below may reference images not displayed]

FINDINGS: Heart size is normal.  The mediastinum is unremarkable.
There may be mild bronchial thickening but there is no infiltrate,
collapse or effusion.  Bony structures are unremarkable.
IMPRESSION: No active disease versus mild bronchitis

## 2011-09-15 DIAGNOSIS — I5032 Chronic diastolic (congestive) heart failure: Secondary | ICD-10-CM

## 2011-09-15 HISTORY — DX: Chronic diastolic (congestive) heart failure: I50.32

## 2011-10-21 IMAGING — CR DG CHEST 2V
2 series · 2 of 2 positions shown · non-contrast
Comparison: 07/30/2009

CLINICAL DATA: Cough.  Chest pain.  Shortness of breath.

CHEST - 2 VIEW

[w chest lat]
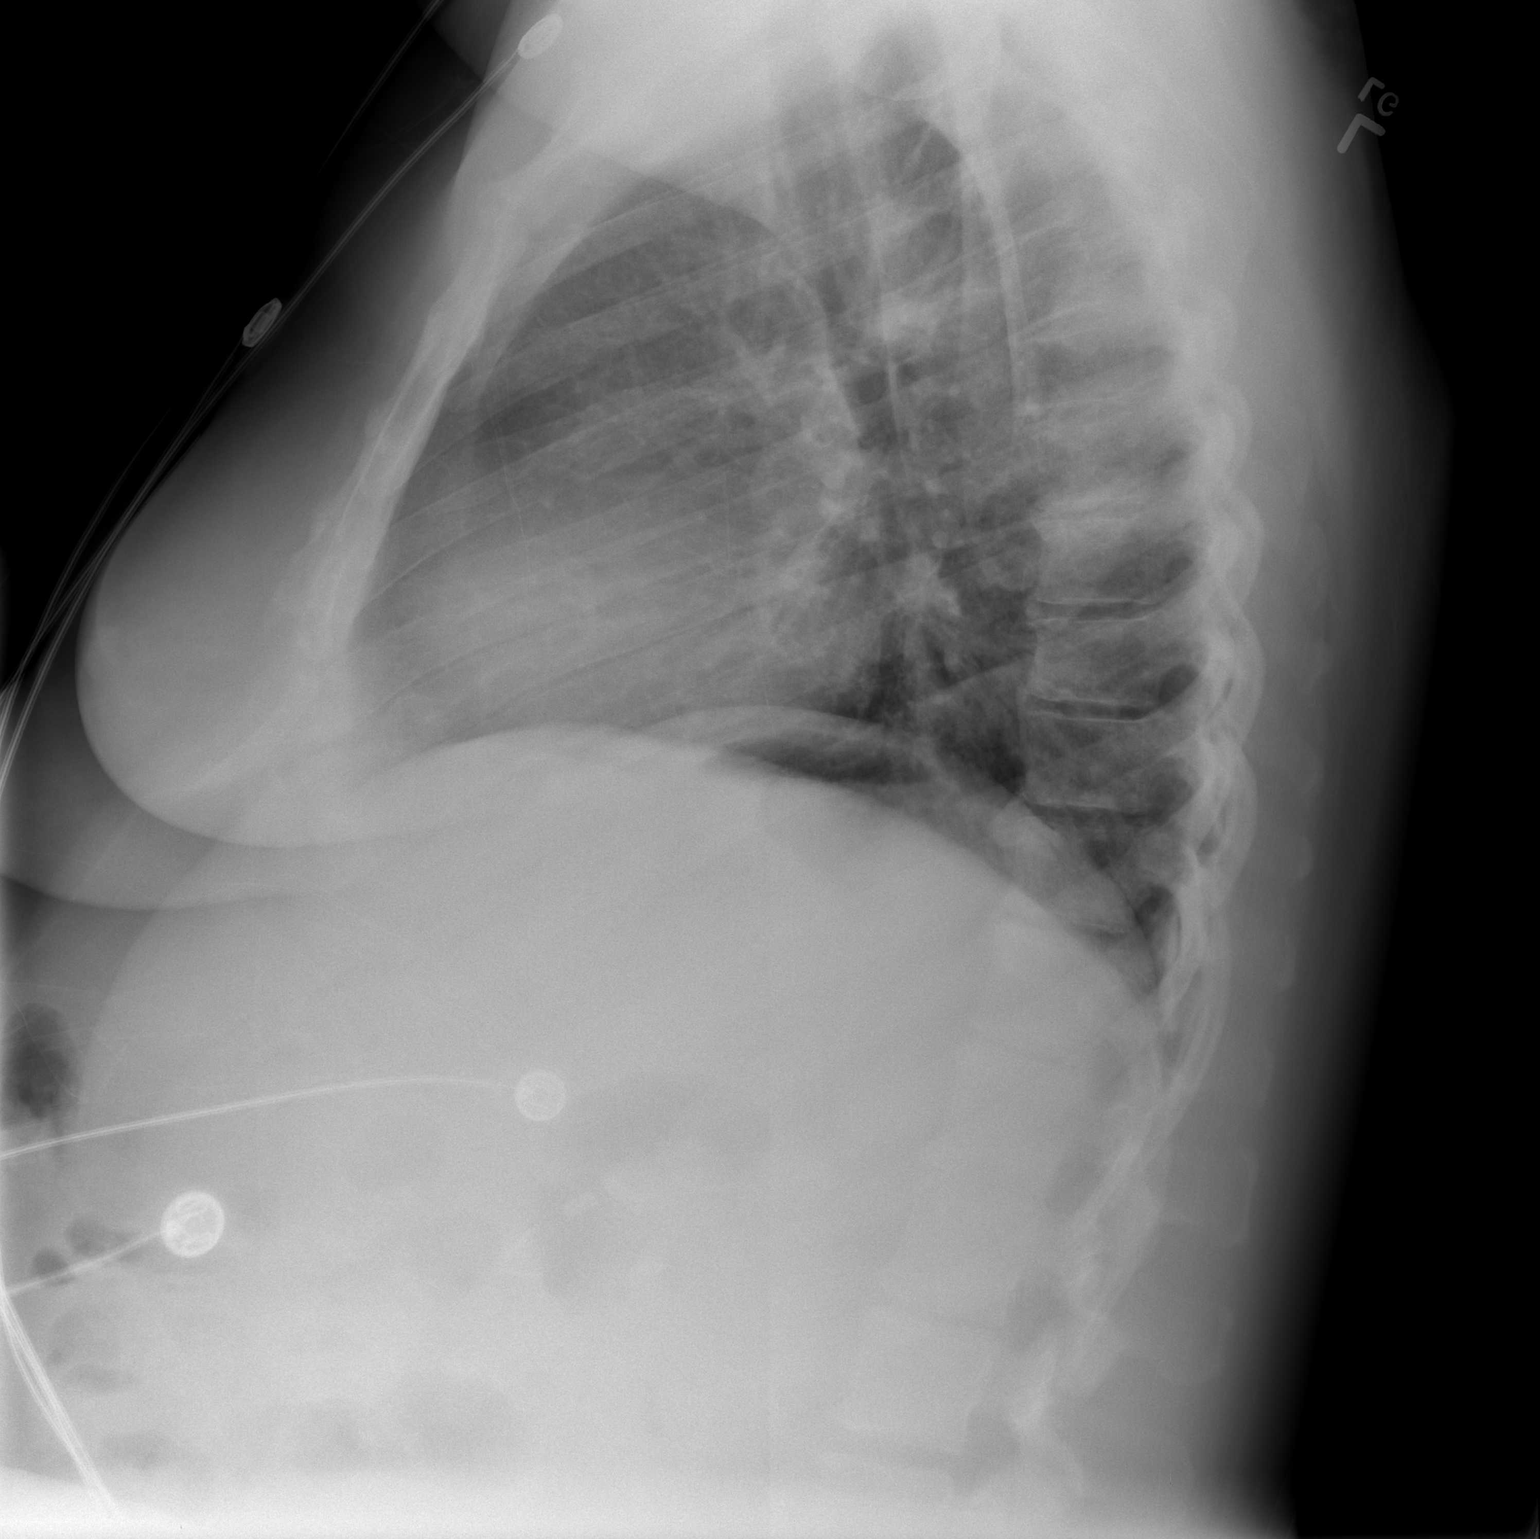

[w chest pa]
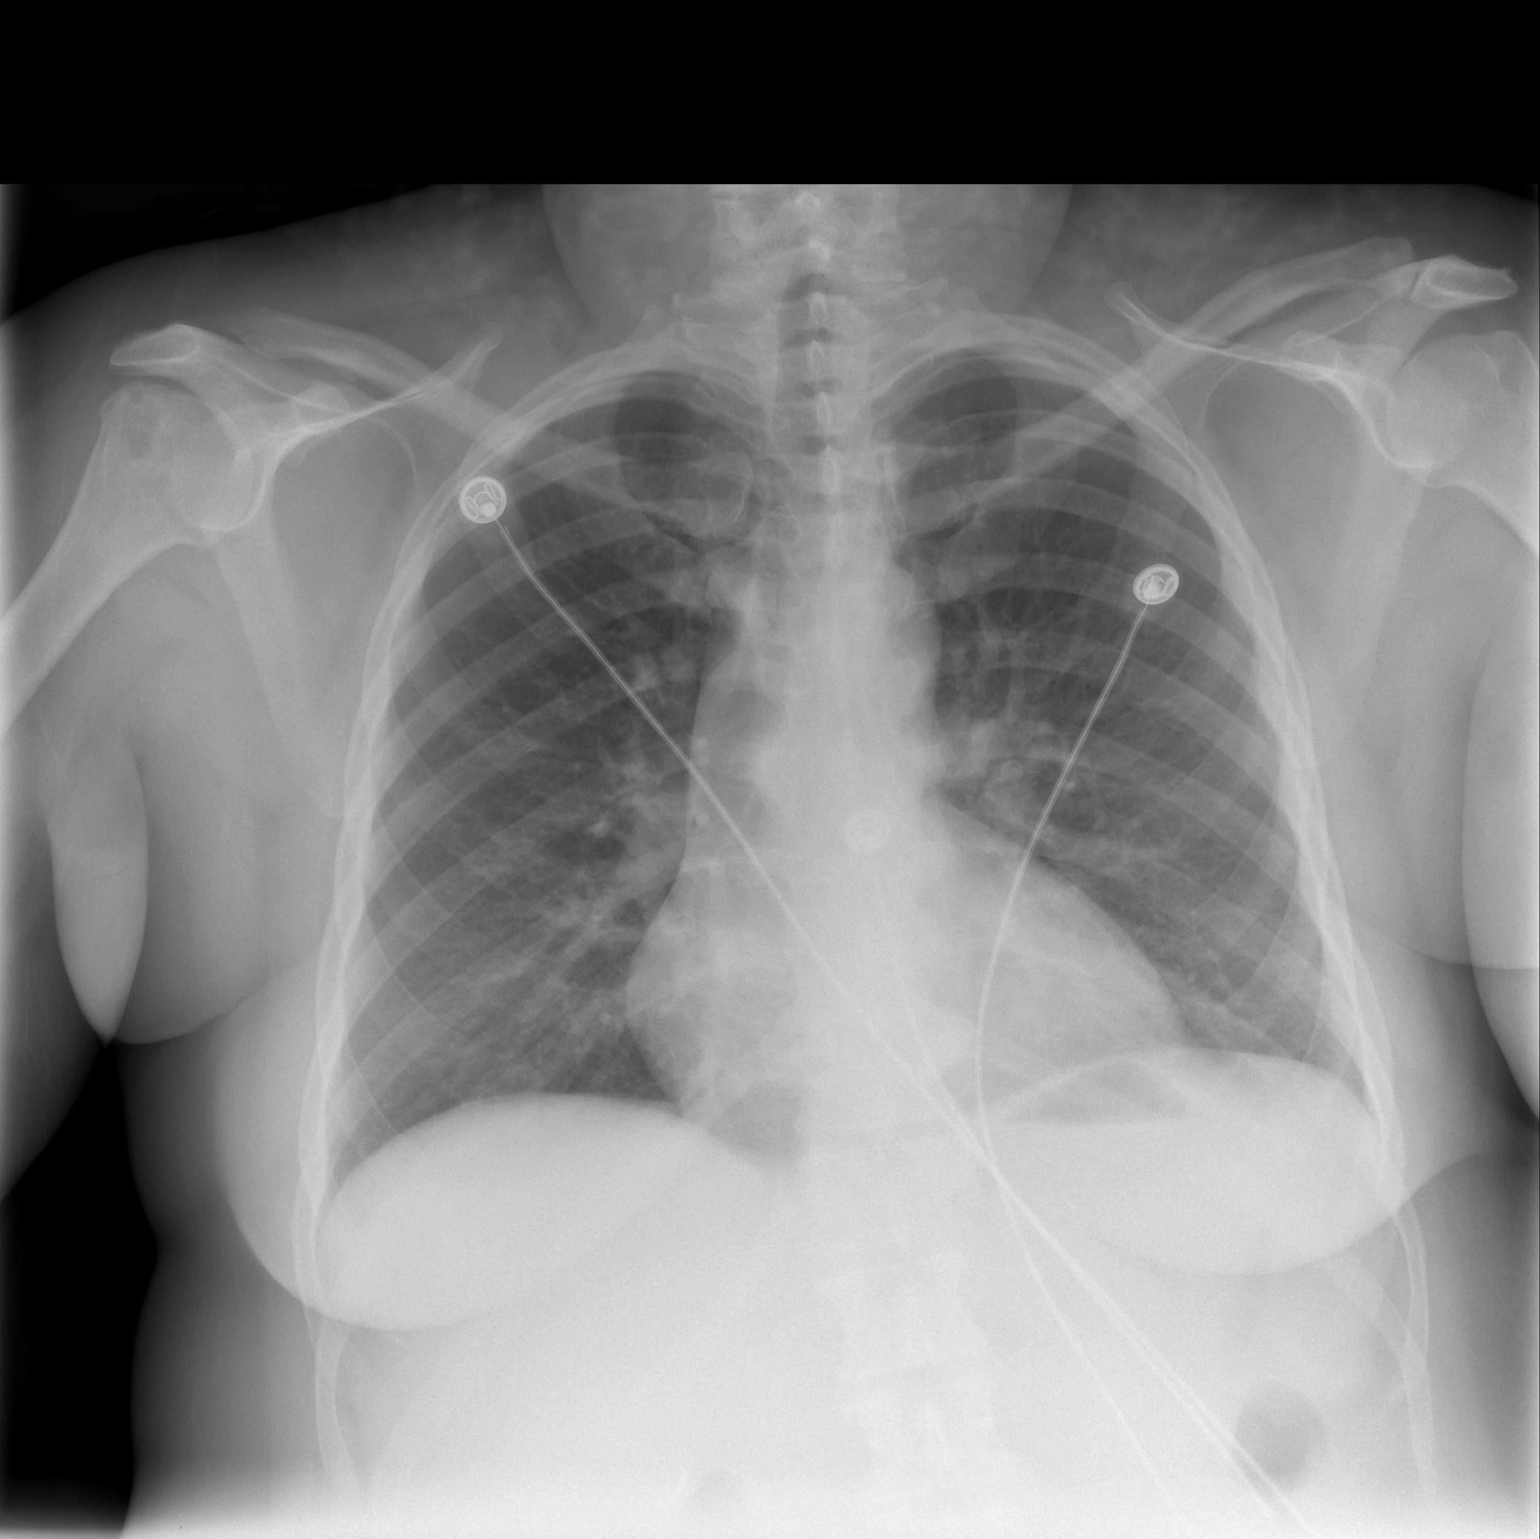

[2 of 2 positions shown; findings below may reference images not displayed]

FINDINGS: Mild cardiomegaly noted.

Thoracic spondylosis is present.

The lungs appear clear.  No pleural effusion identified.
IMPRESSION: 1.  Mild cardiomegaly.  No edema or airspace opacity identified.

## 2012-01-14 ENCOUNTER — Other Ambulatory Visit: Payer: Self-pay | Admitting: Neurosurgery

## 2012-01-14 DIAGNOSIS — M545 Low back pain, unspecified: Secondary | ICD-10-CM

## 2012-01-21 ENCOUNTER — Ambulatory Visit
Admission: RE | Admit: 2012-01-21 | Discharge: 2012-01-21 | Disposition: A | Discharge status: Home / Self Care | Payer: Medicaid Other | Source: Ambulatory Visit | Attending: Neurosurgery | Admitting: Neurosurgery

## 2012-01-21 DIAGNOSIS — M545 Low back pain, unspecified: Secondary | ICD-10-CM

## 2012-02-23 ENCOUNTER — Other Ambulatory Visit: Payer: Self-pay | Admitting: Neurosurgery

## 2012-03-18 ENCOUNTER — Encounter (HOSPITAL_COMMUNITY): Payer: Self-pay

## 2012-03-22 NOTE — Pre-Procedure Instructions (Signed)
20 Lori Bean  03/22/2012   Your procedure is scheduled on:  Friday April 01, 2012.  Report to [facility] at 0530 AM.  Call this number if you have problems the morning of surgery: [phone]   Remember:   Do not eat food or drink:After Midnight.     Take these medicines the morning of surgery with A SIP OF WATER: Albuterol inhaler if needed for shortness of breath, Amlodipine (Norvasc), Omeprazole (Prilosec), Aripiprazole (Abilify), Oxycodone (Percocet) if needed for pain, and Tramadol (Ultram) if needed for pain.   Do not wear jewelry, make-up or nail polish.  Do not wear lotions, powders, or perfumes.   Do not shave 48 hours prior to surgery.   Do not bring valuables to the hospital.  Contacts, dentures or bridgework may not be worn into surgery.  Leave suitcase in the car. After surgery it may be brought to your room.  For patients admitted to the hospital, checkout time is 11:00 AM the day of discharge.   Patients discharged the day of surgery will not be allowed to drive home.  Name and phone number of your driver:   Special Instructions: CHG Shower Use Special Wash: 1/2 bottle night before surgery and 1/2 bottle morning of surgery.   Please read over the following fact sheets that you were given: Pain Booklet, Coughing and Deep Breathing, MRSA Information and Surgical Site Infection Prevention

## 2012-03-23 ENCOUNTER — Inpatient Hospital Stay (HOSPITAL_COMMUNITY): Admission: RE | Admit: 2012-03-23 | Discharge: 2012-03-23 | Payer: Medicaid Other | Source: Ambulatory Visit

## 2012-03-23 ENCOUNTER — Encounter (HOSPITAL_COMMUNITY): Payer: Self-pay

## 2012-03-23 ENCOUNTER — Encounter (HOSPITAL_COMMUNITY)
Admission: RE | Admit: 2012-03-23 | Discharge: 2012-03-23 | Disposition: A | Discharge status: Home / Self Care | Payer: Medicaid Other | Source: Ambulatory Visit | Attending: Neurosurgery | Admitting: Neurosurgery

## 2012-03-23 HISTORY — DX: Bronchitis, not specified as acute or chronic: J40

## 2012-03-23 HISTORY — DX: Angina pectoris, unspecified: I20.9

## 2012-03-23 HISTORY — DX: Cardiac murmur, unspecified: R01.1

## 2012-03-23 HISTORY — DX: Major depressive disorder, single episode, unspecified: F32.9

## 2012-03-23 HISTORY — DX: Depression, unspecified: F32.A

## 2012-03-23 HISTORY — DX: Unspecified osteoarthritis, unspecified site: M19.90

## 2012-03-23 HISTORY — DX: Heart failure, unspecified: I50.9

## 2012-03-23 HISTORY — DX: Overactive bladder: N32.81

## 2012-03-23 HISTORY — DX: Shortness of breath: R06.02

## 2012-03-23 HISTORY — DX: Gastro-esophageal reflux disease without esophagitis: K21.9

## 2012-03-23 HISTORY — DX: Snoring: R06.83

## 2012-03-23 LAB — COMPREHENSIVE METABOLIC PANEL
ALT: 16 U/L (ref 0–35)
AST: 8 U/L (ref 0–37)
Albumin: 3.5 g/dL (ref 3.5–5.2)
Alkaline Phosphatase: 66 U/L (ref 39–117)
BUN: 13 mg/dL (ref 6–23)
CO2: 24 mEq/L (ref 19–32)
Calcium: 9.3 mg/dL (ref 8.4–10.5)
Chloride: 103 mEq/L (ref 96–112)
Creatinine, Ser: 0.76 mg/dL (ref 0.50–1.10)
GFR calc Af Amer: >90 mL/min (ref >90)
GFR calc non Af Amer: >90 mL/min (ref >90)
Glucose, Bld: 108 mg/dL — ABNORMAL HIGH (ref 70–99)
Potassium: 4.1 mEq/L (ref 3.5–5.1)
Sodium: 138 mEq/L (ref 135–145)
Total Bilirubin: 0.3 mg/dL (ref 0.3–1.2)
Total Protein: 7 g/dL (ref 6.0–8.3)

## 2012-03-23 LAB — SURGICAL PCR SCREEN
MRSA, PCR: NEGATIVE
Staphylococcus aureus: POSITIVE — AB

## 2012-03-23 LAB — HCG, SERUM, QUALITATIVE: Preg, Serum: NEGATIVE

## 2012-03-23 LAB — CBC
HCT: 41.6 % (ref 36.0–46.0)
Hemoglobin: 13.8 g/dL (ref 12.0–15.0)
MCH: 30.4 pg (ref 26.0–34.0)
MCHC: 33.2 g/dL (ref 30.0–36.0)
MCV: 91.6 fL (ref 78.0–100.0)
Platelets: 243 10*3/uL (ref 150–400)
RBC: 4.54 MIL/uL (ref 3.87–5.11)
RDW: 13.9 % (ref 11.5–15.5)
WBC: 10.2 10*3/uL (ref 4.0–10.5)

## 2012-03-23 NOTE — Progress Notes (Signed)
Patient denied having a stress test, cardiac cath, or sleep study. Records requested from PCP Dr. Roseanne Reno at [facility]. Patient stated her LOV was yesterday. Patient denied having a cardiologist.

## 2012-03-24 NOTE — Consult Note (Addendum)
Anesthesia Chart Review:  Patient is a 41 year old female scheduled for two level lumbar laminectomy on 04/01/12.  History includes smoking, asthma, snoring disorder, GERD, Bipolar disorder, heart murmur, CHF, anginal pain, anxiety, arthritis, SOB, overactive bladder, tubal ligation, eye, gallbladder, and shoulder surgeries.  PCP is Dr. Roseanne Reno at [facility].  She denied ever have a stress test, cardiac cath, or a Cardiologist.  Labs noted.  Cr 0.76, glucose 108, CBC WNL.  Serum pregnancy test was negative.  CXR on 03/23/12 showed the lungs are clear without focal infiltrate, edema, pneumothorax or pleural effusion. No edema or focal airspace consolidation. A prominent vessel on end in the left perihilar region is stable. Imaged bony structures of the thorax are intact.   EKG from 03/23/12 showed NSR, cannot rule out anterior infarct (age undetermined).  She has had numerous comparison EKGs (see Muse).  Poor r wave progression is not as notably in her last EKG from August 2012, but she has an EKG from 09/16/09 that appears similar.  I was not asked to talk with her during her PAT visit.  Her PAT RN has requested the latest records from her PCP, but these are still pending.  I also left a message for the patient to call me to get a better understanding of chest pain and CHF history.    Shonna Chock, PA-C 03/24/12 1719  Addendum: 03/25/12  1500 I spoke with Ms. Staunton earlier today.  She was coughing and sounded hoarse.  She reported wheezing and need for nebulizer treatments TID.  Her chest has been tight.  She denies fevers.  Symptoms started 03/19/12 and have stayed about the same.  She saw Dr. Roseanne Reno on 03/22/12 but was not given any new prescriptions.  She did not tell him about her planned surgery.  Even on a good day her activity is significantly limited due to back pain and SOB.     In regards to her CHF and chest pain history.  She reports the chest pain has been associated with  asthma exacerbations.  She said that she was hospitalized in November 2011 and July 2012 for CHF within the [facility].  Records indicate her last hospitalizations have been for acute respiratory failure due to asthma exacerbations (not CHF).  I didn't see any specific diagnosis of CHF.    I reviewed above with Anesthesiologist Dr. Noreene Larsson.  Agrees that she would benefit from a pre-operative Pulmonary evaluation.  I notified Erie Noe at Dr. Trudee Grip office who will arrange.  I updated Ms. Zender.  She is aware that if her respiratory symptoms worsen or if she develops additional symptoms such as fever then she should seek immediate medical attention.  Addendum: 03/30/12 0845 Patient was seen by Dr. Sherene Sires yesterday.  His note is still unsigned, but under the Encounter tab there are instructions for the patient including a statement that states she is "cleared for surgery."

## 2012-03-25 ENCOUNTER — Other Ambulatory Visit (HOSPITAL_COMMUNITY): Payer: Medicaid Other

## 2012-03-29 ENCOUNTER — Encounter: Payer: Self-pay | Admitting: Internal Medicine

## 2012-03-29 ENCOUNTER — Ambulatory Visit (INDEPENDENT_AMBULATORY_CARE_PROVIDER_SITE_OTHER): Payer: Medicaid Other | Admitting: Internal Medicine

## 2012-03-29 VITALS — BP 118/78 | HR 84 | Temp 98.4°F | Ht 62.0 in | Wt 236.4 lb

## 2012-03-29 DIAGNOSIS — R0602 Shortness of breath: Secondary | ICD-10-CM

## 2012-03-29 DIAGNOSIS — J45909 Unspecified asthma, uncomplicated: Secondary | ICD-10-CM

## 2012-03-29 MED ORDER — MOMETASONE FURO-FORMOTEROL FUM 200-5 MCG/ACT IN AERO: INHALATION_SPRAY | RESPIRATORY_TRACT | Status: DC

## 2012-03-29 MED ORDER — FAMOTIDINE 20 MG PO TABS: ORAL_TABLET | ORAL | Status: DC

## 2012-03-29 MED ORDER — DEXLANSOPRAZOLE 60 MG PO CPDR: 60.0000 mg | DELAYED_RELEASE_CAPSULE | Freq: Every day | ORAL | Status: DC

## 2012-03-29 NOTE — Patient Instructions (Addendum)
Dexilant 60 mg Take 30-60 min before first meal of the day and Pepcid ac 20 mg one at bedtime until return  Dulera 200 Take 2 puffs first thing in am and then another 2 puffs about 12 hours later.    Work on inhaler technique:  relax and gently blow all the way out then take a nice smooth deep breath back in, triggering the inhaler at same time you start breathing in.  Hold for up to 5 seconds if you can.  Rinse and gargle with water when done   If your mouth or throat starts to bother you,   I suggest you time the inhaler to your dental care and after using the inhaler(s) brush teeth and tongue with a baking soda containing toothpaste and when you rinse this out, gargle with it first to see if this helps your mouth and throat.     Only use your albuterol (ventolin) as a rescue medication to be used if you can't catch your breath by resting or doing a relaxed purse lip breathing pattern. The less you use it, the better it will work when you need it. Goal is less than twice a week.  You are cleared for surgery  Please schedule a follow up office visit in 2 weeks, sooner if needed

## 2012-03-29 NOTE — Progress Notes (Signed)
  Subjective:    Patient ID: Lori Bean, female    DOB: 14-Nov-1970, 41 y.o.   MRN: [id number]  HPI  55 yof smoker with asthma dx in high school which she characterizes as well controlled referred 03/29/2012 to pulmonary clinic for pre op eval by Dr Gerlene Fee   03/29/2012 1st pulmonary eval cc "asthma" under good control using albuterol every daily but can use it up 3 days and goes to ER frequently with residual doe x 50 ft,  Wakes up a couple times a week sensing she's drowning, hoarse, but no excess or purulent sputum. No definite seasonal pattern, some better with short courses of prednisone.   Presently no unusual cough, purulent sputum or sinus/hb symptoms on present rx.     Sleeping ok without nocturnal  or early am exacerbation  of respiratory  c/o's or need for noct saba. Also denies any obvious fluctuation of symptoms with weather or environmental changes or other aggravating or alleviating factors except as outlined above    Review of Systems  Constitutional: Negative for fever, chills and unexpected weight change.  HENT: Negative for ear pain, nosebleeds, congestion, sore throat, rhinorrhea, sneezing, trouble swallowing, dental problem, voice change, postnasal drip and sinus pressure.   Eyes: Negative for visual disturbance.  Respiratory: Positive for cough and shortness of breath. Negative for choking.   Cardiovascular: Positive for chest pain and leg swelling.  Gastrointestinal: Negative for vomiting, abdominal pain and diarrhea.  Genitourinary: Negative for difficulty urinating.  Musculoskeletal: Positive for arthralgias.  Skin: Negative for rash.  Neurological: Negative for tremors, syncope and headaches.  Hematological: Does not bruise/bleed easily.       Objective:   Physical Exam amb wf with freq throat clearing and prominent pseudoasthma Wt 239 03/29/2012        Assessment & Plan:

## 2012-03-31 MED ORDER — DEXAMETHASONE SODIUM PHOSPHATE 10 MG/ML IJ SOLN
10.0000 mg | INTRAMUSCULAR | Status: AC
  Administered 2012-04-01: 10 mg via INTRAVENOUS
  Filled 2012-03-31: qty 1

## 2012-03-31 MED ORDER — CEFAZOLIN SODIUM-DEXTROSE 2-3 GM-% IV SOLR
2.0000 g | INTRAVENOUS | Status: AC
  Administered 2012-04-01: 2 g via INTRAVENOUS
  Filled 2012-03-31: qty 50

## 2012-03-31 MED ORDER — CEFAZOLIN SODIUM 1-5 GM-% IV SOLN: 1.0000 g | INTRAVENOUS | Status: DC

## 2012-04-01 ENCOUNTER — Encounter (HOSPITAL_COMMUNITY): Payer: Self-pay | Admitting: Vascular Surgery

## 2012-04-01 ENCOUNTER — Ambulatory Visit (HOSPITAL_COMMUNITY): Payer: Medicaid Other

## 2012-04-01 ENCOUNTER — Encounter (HOSPITAL_COMMUNITY): Payer: Self-pay | Admitting: *Deleted

## 2012-04-01 ENCOUNTER — Ambulatory Visit (HOSPITAL_COMMUNITY)
Admission: RE | Admit: 2012-04-01 | Discharge: 2012-04-02 | Disposition: A | Discharge status: Home / Self Care | Payer: Medicaid Other | Source: Ambulatory Visit | Attending: Neurosurgery | Admitting: Neurosurgery

## 2012-04-01 ENCOUNTER — Encounter (HOSPITAL_COMMUNITY)
Admission: RE | Disposition: A | Discharge status: Home / Self Care | Payer: Self-pay | Source: Ambulatory Visit | Attending: Neurosurgery

## 2012-04-01 ENCOUNTER — Ambulatory Visit (HOSPITAL_COMMUNITY): Payer: Medicaid Other | Admitting: Vascular Surgery

## 2012-04-01 DIAGNOSIS — F411 Generalized anxiety disorder: Secondary | ICD-10-CM | POA: Insufficient documentation

## 2012-04-01 DIAGNOSIS — K219 Gastro-esophageal reflux disease without esophagitis: Secondary | ICD-10-CM | POA: Insufficient documentation

## 2012-04-01 DIAGNOSIS — Z01818 Encounter for other preprocedural examination: Secondary | ICD-10-CM | POA: Insufficient documentation

## 2012-04-01 DIAGNOSIS — J45909 Unspecified asthma, uncomplicated: Secondary | ICD-10-CM | POA: Insufficient documentation

## 2012-04-01 DIAGNOSIS — Z01810 Encounter for preprocedural cardiovascular examination: Secondary | ICD-10-CM | POA: Insufficient documentation

## 2012-04-01 DIAGNOSIS — I509 Heart failure, unspecified: Secondary | ICD-10-CM | POA: Insufficient documentation

## 2012-04-01 DIAGNOSIS — M5126 Other intervertebral disc displacement, lumbar region: Secondary | ICD-10-CM | POA: Insufficient documentation

## 2012-04-01 DIAGNOSIS — Z01812 Encounter for preprocedural laboratory examination: Secondary | ICD-10-CM | POA: Insufficient documentation

## 2012-04-01 DIAGNOSIS — I10 Essential (primary) hypertension: Secondary | ICD-10-CM | POA: Insufficient documentation

## 2012-04-01 HISTORY — PX: LUMBAR LAMINECTOMY/DECOMPRESSION MICRODISCECTOMY: SHX5026

## 2012-04-01 SURGERY — LUMBAR LAMINECTOMY/DECOMPRESSION MICRODISCECTOMY 2 LEVELS
Anesthesia: General | Site: Back | Laterality: Left | Wound class: Clean

## 2012-04-01 MED ORDER — CEFAZOLIN SODIUM 1-5 GM-% IV SOLN
1.0000 g | Freq: Three times a day (TID) | INTRAVENOUS | Status: AC
  Administered 2012-04-01 (×2): 1 g via INTRAVENOUS
  Filled 2012-04-01 (×3): qty 50

## 2012-04-01 MED ORDER — VECURONIUM BROMIDE 10 MG IV SOLR
INTRAVENOUS | Status: DC | PRN
  Administered 2012-04-01: 2 mg via INTRAVENOUS

## 2012-04-01 MED ORDER — ARIPIPRAZOLE 5 MG PO TABS
5.0000 mg | ORAL_TABLET | Freq: Every day | ORAL | Status: DC
Start: 2012-04-01 — End: 2012-04-02
  Administered 2012-04-01 – 2012-04-02 (×2): 5 mg via ORAL
  Filled 2012-04-01 (×2): qty 1

## 2012-04-01 MED ORDER — ACETAMINOPHEN 10 MG/ML IV SOLN
INTRAVENOUS | Status: DC | PRN
  Administered 2012-04-01: 1000 mg via INTRAVENOUS

## 2012-04-01 MED ORDER — LACTATED RINGERS IV SOLN
INTRAVENOUS | Status: DC | PRN
  Administered 2012-04-01 (×2): via INTRAVENOUS

## 2012-04-01 MED ORDER — 0.9 % SODIUM CHLORIDE (POUR BTL) OPTIME
TOPICAL | Status: DC | PRN
  Administered 2012-04-01: 1000 mL

## 2012-04-01 MED ORDER — ROCURONIUM BROMIDE 100 MG/10ML IV SOLN
INTRAVENOUS | Status: DC | PRN
  Administered 2012-04-01: 50 mg via INTRAVENOUS

## 2012-04-01 MED ORDER — ACETAMINOPHEN 650 MG RE SUPP: 650.0000 mg | RECTAL | Status: DC | PRN

## 2012-04-01 MED ORDER — SODIUM CHLORIDE 0.9 % IR SOLN
Status: DC | PRN
  Administered 2012-04-01: 08:00:00

## 2012-04-01 MED ORDER — ONDANSETRON HCL 4 MG/2ML IJ SOLN: 4.0000 mg | Freq: Once | INTRAMUSCULAR | Status: DC | PRN

## 2012-04-01 MED ORDER — SODIUM CHLORIDE 0.9 % IV SOLN
INTRAVENOUS | Status: AC
  Filled 2012-04-01: qty 500

## 2012-04-01 MED ORDER — HEMOSTATIC AGENTS (NO CHARGE) OPTIME
TOPICAL | Status: DC | PRN
  Administered 2012-04-01: 1 via TOPICAL

## 2012-04-01 MED ORDER — PANTOPRAZOLE SODIUM 40 MG PO TBEC
40.0000 mg | DELAYED_RELEASE_TABLET | Freq: Every day | ORAL | Status: DC
  Administered 2012-04-01: 40 mg via ORAL
  Filled 2012-04-01: qty 1

## 2012-04-01 MED ORDER — DEXTROSE 5 % IV SOLN
500.0000 mg | Freq: Four times a day (QID) | INTRAVENOUS | Status: DC | PRN
  Filled 2012-04-01 (×2): qty 5

## 2012-04-01 MED ORDER — DEXAMETHASONE 4 MG PO TABS
4.0000 mg | ORAL_TABLET | Freq: Four times a day (QID) | ORAL | Status: AC
  Administered 2012-04-01 (×2): 4 mg via ORAL
  Filled 2012-04-01 (×2): qty 1

## 2012-04-01 MED ORDER — ACETAMINOPHEN 10 MG/ML IV SOLN
INTRAVENOUS | Status: AC
  Filled 2012-04-01: qty 100

## 2012-04-01 MED ORDER — BUPIVACAINE HCL (PF) 0.5 % IJ SOLN
INTRAMUSCULAR | Status: DC | PRN
  Administered 2012-04-01: 20 mL

## 2012-04-01 MED ORDER — HYDROMORPHONE HCL PF 1 MG/ML IJ SOLN: 1.0000 mg | INTRAMUSCULAR | Status: DC | PRN

## 2012-04-01 MED ORDER — DARIFENACIN HYDROBROMIDE ER 7.5 MG PO TB24
7.5000 mg | ORAL_TABLET | Freq: Every day | ORAL | Status: DC
  Administered 2012-04-01 – 2012-04-02 (×2): 7.5 mg via ORAL
  Filled 2012-04-01 (×2): qty 1

## 2012-04-01 MED ORDER — SODIUM CHLORIDE 0.9 % IJ SOLN: 3.0000 mL | INTRAMUSCULAR | Status: DC | PRN

## 2012-04-01 MED ORDER — ACETAMINOPHEN 325 MG PO TABS: 650.0000 mg | ORAL_TABLET | ORAL | Status: DC | PRN

## 2012-04-01 MED ORDER — DEXAMETHASONE SODIUM PHOSPHATE 4 MG/ML IJ SOLN: 4.0000 mg | Freq: Four times a day (QID) | INTRAMUSCULAR | Status: AC

## 2012-04-01 MED ORDER — FUROSEMIDE 40 MG PO TABS
40.0000 mg | ORAL_TABLET | Freq: Every day | ORAL | Status: DC
  Administered 2012-04-01 – 2012-04-02 (×2): 40 mg via ORAL
  Filled 2012-04-01 (×2): qty 1

## 2012-04-01 MED ORDER — OXYCODONE-ACETAMINOPHEN 5-325 MG PO TABS
1.0000 | ORAL_TABLET | ORAL | Status: DC | PRN
  Administered 2012-04-01 – 2012-04-02 (×5): 2 via ORAL
  Filled 2012-04-01 (×5): qty 2

## 2012-04-01 MED ORDER — MIDAZOLAM HCL 5 MG/5ML IJ SOLN
INTRAMUSCULAR | Status: DC | PRN
  Administered 2012-04-01: 2 mg via INTRAVENOUS

## 2012-04-01 MED ORDER — ALBUTEROL SULFATE HFA 108 (90 BASE) MCG/ACT IN AERS
INHALATION_SPRAY | RESPIRATORY_TRACT | Status: DC | PRN
  Administered 2012-04-01: 2 via RESPIRATORY_TRACT

## 2012-04-01 MED ORDER — KCL IN DEXTROSE-NACL 20-5-0.45 MEQ/L-%-% IV SOLN
80.0000 mL/h | INTRAVENOUS | Status: DC
  Filled 2012-04-01 (×4): qty 1000

## 2012-04-01 MED ORDER — ZOLPIDEM TARTRATE 5 MG PO TABS: 5.0000 mg | ORAL_TABLET | Freq: Every evening | ORAL | Status: DC | PRN

## 2012-04-01 MED ORDER — LIDOCAINE HCL (CARDIAC) 20 MG/ML IV SOLN
INTRAVENOUS | Status: DC | PRN
  Administered 2012-04-01: 100 mg via INTRAVENOUS

## 2012-04-01 MED ORDER — SODIUM CHLORIDE 0.9 % IJ SOLN
3.0000 mL | Freq: Two times a day (BID) | INTRAMUSCULAR | Status: DC
  Administered 2012-04-01 – 2012-04-02 (×2): 3 mL via INTRAVENOUS

## 2012-04-01 MED ORDER — ONDANSETRON HCL 4 MG/2ML IJ SOLN
INTRAMUSCULAR | Status: DC | PRN
  Administered 2012-04-01: 4 mg via INTRAVENOUS

## 2012-04-01 MED ORDER — NEOSTIGMINE METHYLSULFATE 1 MG/ML IJ SOLN
INTRAMUSCULAR | Status: DC | PRN
  Administered 2012-04-01: 4 mg via INTRAVENOUS

## 2012-04-01 MED ORDER — PROPOFOL 10 MG/ML IV EMUL
INTRAVENOUS | Status: DC | PRN
  Administered 2012-04-01: 50 mg via INTRAVENOUS
  Administered 2012-04-01: 100 mg via INTRAVENOUS

## 2012-04-01 MED ORDER — ARTIFICIAL TEARS OP OINT
TOPICAL_OINTMENT | OPHTHALMIC | Status: DC | PRN
  Administered 2012-04-01: 1 via OPHTHALMIC

## 2012-04-01 MED ORDER — BACITRACIN 50000 UNITS IM SOLR
INTRAMUSCULAR | Status: AC
  Filled 2012-04-01: qty 1

## 2012-04-01 MED ORDER — KETOROLAC TROMETHAMINE 30 MG/ML IJ SOLN
30.0000 mg | Freq: Four times a day (QID) | INTRAMUSCULAR | Status: DC
  Administered 2012-04-01 – 2012-04-02 (×4): 30 mg via INTRAVENOUS
  Filled 2012-04-01 (×8): qty 1

## 2012-04-01 MED ORDER — METHOCARBAMOL 500 MG PO TABS
500.0000 mg | ORAL_TABLET | Freq: Four times a day (QID) | ORAL | Status: DC | PRN
  Administered 2012-04-01: 500 mg via ORAL
  Filled 2012-04-01 (×2): qty 1

## 2012-04-01 MED ORDER — LIDOCAINE HCL 4 % MT SOLN
OROMUCOSAL | Status: DC | PRN
  Administered 2012-04-01: 4 mL via TOPICAL

## 2012-04-01 MED ORDER — GLYCOPYRROLATE 0.2 MG/ML IJ SOLN
INTRAMUSCULAR | Status: DC | PRN
  Administered 2012-04-01: 0.6 mg via INTRAVENOUS

## 2012-04-01 MED ORDER — ONDANSETRON HCL 4 MG/2ML IJ SOLN
4.0000 mg | INTRAMUSCULAR | Status: DC | PRN
  Administered 2012-04-01: 4 mg via INTRAVENOUS
  Filled 2012-04-01: qty 2

## 2012-04-01 MED ORDER — HYDROMORPHONE HCL PF 1 MG/ML IJ SOLN
INTRAMUSCULAR | Status: AC
  Filled 2012-04-01: qty 1

## 2012-04-01 MED ORDER — AMLODIPINE BESYLATE 10 MG PO TABS
10.0000 mg | ORAL_TABLET | Freq: Every day | ORAL | Status: DC
  Administered 2012-04-01 – 2012-04-02 (×2): 10 mg via ORAL
  Filled 2012-04-01 (×2): qty 1

## 2012-04-01 MED ORDER — DEXTROSE 5 % IV SOLN
500.0000 mg | INTRAVENOUS | Status: AC
  Administered 2012-04-01: 500 mg via INTRAVENOUS
  Filled 2012-04-01: qty 5

## 2012-04-01 MED ORDER — HYDROMORPHONE HCL PF 1 MG/ML IJ SOLN
0.2500 mg | INTRAMUSCULAR | Status: DC | PRN
  Administered 2012-04-01 (×2): 0.5 mg via INTRAVENOUS

## 2012-04-01 MED ORDER — FENTANYL CITRATE 0.05 MG/ML IJ SOLN
INTRAMUSCULAR | Status: DC | PRN
  Administered 2012-04-01: 100 ug via INTRAVENOUS
  Administered 2012-04-01 (×2): 50 ug via INTRAVENOUS
  Administered 2012-04-01: 25 ug via INTRAVENOUS
  Administered 2012-04-01 (×3): 50 ug via INTRAVENOUS
  Administered 2012-04-01: 25 ug via INTRAVENOUS

## 2012-04-01 MED ORDER — PHENOL 1.4 % MT LIQD: 1.0000 | OROMUCOSAL | Status: DC | PRN

## 2012-04-01 MED ORDER — BACITRACIN ZINC 500 UNIT/GM EX OINT
TOPICAL_OINTMENT | CUTANEOUS | Status: DC | PRN
  Administered 2012-04-01: 1 via TOPICAL

## 2012-04-01 MED ORDER — THROMBIN 5000 UNITS EX KIT
PACK | CUTANEOUS | Status: DC | PRN
  Administered 2012-04-01 (×2): 5000 [IU] via TOPICAL

## 2012-04-01 MED ORDER — MENTHOL 3 MG MT LOZG: 1.0000 | LOZENGE | OROMUCOSAL | Status: DC | PRN

## 2012-04-01 MED ORDER — ALBUTEROL SULFATE HFA 108 (90 BASE) MCG/ACT IN AERS
2.0000 | INHALATION_SPRAY | Freq: Four times a day (QID) | RESPIRATORY_TRACT | Status: DC | PRN
  Filled 2012-04-01: qty 6.7

## 2012-04-01 SURGICAL SUPPLY — 45 items
BAG DECANTER FOR FLEXI CONT (MISCELLANEOUS) ×2 IMPLANT
BENZOIN TINCTURE PRP APPL 2/3 (GAUZE/BANDAGES/DRESSINGS) ×2 IMPLANT
BLADE SURG ROTATE 9660 (MISCELLANEOUS) IMPLANT
BRUSH SCRUB EZ PLAIN DRY (MISCELLANEOUS) ×2 IMPLANT
CANISTER SUCTION 2500CC (MISCELLANEOUS) ×2 IMPLANT
CLOTH BEACON ORANGE TIMEOUT ST (SAFETY) ×2 IMPLANT
CONT SPEC 4OZ CLIKSEAL STRL BL (MISCELLANEOUS) ×2 IMPLANT
DERMABOND ADVANCED (GAUZE/BANDAGES/DRESSINGS) ×1
DERMABOND ADVANCED .7 DNX12 (GAUZE/BANDAGES/DRESSINGS) ×1 IMPLANT
DRAPE LAPAROTOMY 100X72X124 (DRAPES) ×2 IMPLANT
DRAPE MICROSCOPE ZEISS OPMI (DRAPES) ×2 IMPLANT
DRAPE SURG 17X23 STRL (DRAPES) ×4 IMPLANT
DRESSING TELFA 8X3 (GAUZE/BANDAGES/DRESSINGS) ×2 IMPLANT
ELECT REM PT RETURN 9FT ADLT (ELECTROSURGICAL) ×2
ELECTRODE REM PT RTRN 9FT ADLT (ELECTROSURGICAL) ×1 IMPLANT
GAUZE SPONGE 4X4 16PLY XRAY LF (GAUZE/BANDAGES/DRESSINGS) ×2 IMPLANT
GLOVE BIO SURGEON STRL SZ8.5 (GLOVE) ×4 IMPLANT
GLOVE BIOGEL PI IND STRL 7.0 (GLOVE) ×2 IMPLANT
GLOVE BIOGEL PI INDICATOR 7.0 (GLOVE) ×2
GLOVE ECLIPSE 7.5 STRL STRAW (GLOVE) ×2 IMPLANT
GLOVE SS BIOGEL STRL SZ 8 (GLOVE) ×2 IMPLANT
GLOVE SUPERSENSE BIOGEL SZ 8 (GLOVE) ×2
GLOVE SURG SS PI 6.5 STRL IVOR (GLOVE) ×8 IMPLANT
GOWN BRE IMP SLV AUR LG STRL (GOWN DISPOSABLE) ×4 IMPLANT
GOWN BRE IMP SLV AUR XL STRL (GOWN DISPOSABLE) ×4 IMPLANT
GOWN STRL REIN 2XL LVL4 (GOWN DISPOSABLE) IMPLANT
KIT BASIN OR (CUSTOM PROCEDURE TRAY) ×2 IMPLANT
KIT ROOM TURNOVER OR (KITS) ×2 IMPLANT
NEEDLE HYPO 22GX1.5 SAFETY (NEEDLE) ×2 IMPLANT
NEEDLE SPNL 18GX3.5 QUINCKE PK (NEEDLE) ×2 IMPLANT
NS IRRIG 1000ML POUR BTL (IV SOLUTION) ×2 IMPLANT
PACK LAMINECTOMY NEURO (CUSTOM PROCEDURE TRAY) ×2 IMPLANT
PAD ARMBOARD 7.5X6 YLW CONV (MISCELLANEOUS) ×6 IMPLANT
PATTIES SURGICAL .75X.75 (GAUZE/BANDAGES/DRESSINGS) IMPLANT
RUBBERBAND STERILE (MISCELLANEOUS) ×4 IMPLANT
SPONGE GAUZE 4X4 12PLY (GAUZE/BANDAGES/DRESSINGS) ×2 IMPLANT
SPONGE SURGIFOAM ABS GEL SZ50 (HEMOSTASIS) ×2 IMPLANT
STRIP CLOSURE SKIN 1/2X4 (GAUZE/BANDAGES/DRESSINGS) ×2 IMPLANT
SUT VIC AB 2-0 OS6 18 (SUTURE) ×8 IMPLANT
SUT VIC AB 3-0 CP2 18 (SUTURE) ×2 IMPLANT
SYR 20ML ECCENTRIC (SYRINGE) ×2 IMPLANT
TOOL FLUTED BALL 7MM (MISCELLANEOUS) ×2 IMPLANT
TOWEL OR 17X24 6PK STRL BLUE (TOWEL DISPOSABLE) ×2 IMPLANT
TOWEL OR 17X26 10 PK STRL BLUE (TOWEL DISPOSABLE) ×2 IMPLANT
WATER STERILE IRR 1000ML POUR (IV SOLUTION) ×2 IMPLANT

## 2012-04-01 NOTE — Op Note (Signed)
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 Localizing x-ray was taken prior to incision to identify the appropriate level. Midline incision was made above the spinous processes of L4-L5 and S1. Using the Bovie cutting current the incision was carried on the spinous processes. Subperiosteal dissection was then carried out on the left side of the spinous processes and lamina and facet joint and self-retaining tract was placed for exposure. X-ray showed approach the appropriate levels. Starting at L4-5 laminotomy was performed removing the inferior one third of the L4 lamina the medial one third of the facet joint and the superior one third of the L5 lamina. Residual bone and ligamentum flavum removed in a piecemeal fashion. Similar decompression was then carried out on the left at L5-S1 and residual bone and ligamentum flavum removed this level as well. At this time the microscope was draped brought into the field and used for the remainder of the case. Starting at L5-S1 using microdissection technique the lateral aspect of the thecal sac and S1 nerve root identified. Further coagulation was carried out on 4 canal to identify the L5-S1 disc which showed a calcified and herniated. The calcium shell of the disc was removed the disc then incised a 15 blade. Thorough displaced cleanout was carried out while the same time great care was taken to injury to the neural elements this was successfully done. Attention was then turned L4-5 were similar discectomy was carried out. Once again we identified the disc was found markedly herniated beneath the nerve root on the annulus and aggressively cleaned out with pituitary rongeurs and  curettes. At this time inspection was carried out of both levels for any evidence of residual compression and none could be identified. Large amounts of irrigation carried out and any bleeding control proper coagulation Gelfoam. The was then closed in multiple layers of Vicryl on the muscle fascia subcutaneous and subcuticular tissues and Dermabond Steri-Strips were placed on the skin. A sterile dressing was applied and the patient was extubated and taken recovery in stable condition.

## 2012-04-01 NOTE — Anesthesia Postprocedure Evaluation (Signed)
Anesthesia Post Note  Patient: Lori Bean  Procedure(s) Performed: Procedure(s) (LRB): LUMBAR LAMINECTOMY/DECOMPRESSION MICRODISCECTOMY 2 LEVELS (Left)  Anesthesia type: general  Patient location: PACU  Post pain: Pain level controlled  Post assessment: Patient's Cardiovascular Status Stable  Last Vitals:  Filed Vitals:   04/01/12 1015  BP: 128/73  Pulse: 67  Temp:   Resp: 26    Post vital signs: Reviewed and stable  Level of consciousness: sedated  Complications: No apparent anesthesia complications

## 2012-04-01 NOTE — Anesthesia Preprocedure Evaluation (Addendum)
Anesthesia Evaluation  Patient identified by MRN, date of birth, ID band Patient awake    Reviewed: Allergy & Precautions, H&P , NPO status , Patient's Chart, lab work & pertinent test results  Airway Mallampati: I TM Distance: >3 FB Neck ROM: Full    Dental  (+) Dental Advisory Given   Pulmonary shortness of breath and with exertion, asthma ,  Uses inhaler frequently, seen by pulmonologist on 7/17         Cardiovascular hypertension, Pt. on medications +CHF     Neuro/Psych PSYCHIATRIC DISORDERS Anxiety Depression Bipolar Disorder    GI/Hepatic GERD-  Medicated,  Endo/Other  Morbid obesity  Renal/GU      Musculoskeletal   Abdominal   Peds  Hematology   Anesthesia Other Findings   Reproductive/Obstetrics                         Anesthesia Physical Anesthesia Plan  ASA: III  Anesthesia Plan: General   Post-op Pain Management:    Induction: Intravenous  Airway Management Planned: Oral ETT  Additional Equipment:   Intra-op Plan:   Post-operative Plan: Extubation in OR  Informed Consent: I have reviewed the patients History and Physical, chart, labs and discussed the procedure including the risks, benefits and alternatives for the proposed anesthesia with the patient or authorized representative who has indicated his/her understanding and acceptance.   Dental advisory given  Plan Discussed with: CRNA and Surgeon  Anesthesia Plan Comments:        Anesthesia Quick Evaluation
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
## 2012-04-01 NOTE — Transfer of Care (Signed)
Immediate Anesthesia Transfer of Care Note  Patient: Lori Bean  Procedure(s) Performed: Procedure(s) (LRB): LUMBAR LAMINECTOMY/DECOMPRESSION MICRODISCECTOMY 2 LEVELS (Left)  Patient Location: PACU  Anesthesia Type: General  Level of Consciousness: awake, alert  and oriented  Airway & Oxygen Therapy: Patient Spontanous Breathing and Patient connected to nasal cannula oxygen  Post-op Assessment: Report given to PACU RN and Post -op Vital signs reviewed and stable  Post vital signs: Reviewed and stable  Complications: No apparent anesthesia complications

## 2012-04-01 NOTE — Plan of Care (Signed)
Problem: Consults Goal: Diagnosis - Spinal Surgery Outcome: Completed/Met Date Met:  04/01/12 Lumbar Laminectomy (Complex)

## 2012-04-01 NOTE — H&P (Signed)
Lori Bean is an 41 y.o. female.   Chief Complaint: Back and left leg pain HPI: The patient is a 41 year old female who presents with back and left leg pain. She was tried on aggressive conservative therapy without improvement. She underwent imaging studies which showed to significant disc abnormalities 1 at L4-5 on the left and 1 at L5-S1. She tried and failed additional conservative therapy and then requested surgery and now comes for a two-level lumbar microdiscectomy. I had a long discussion with her regarding the risks and benefits of surgical intervention. The risks discussed include but are not limited to bleeding infection weakness numbness paralysis spinal fluid leakage coma and death. We have discussed alternative methods of therapy offered risks and benefits of nonintervention. She has had the opportunity to ask numerous questions and appears to understand. With this information in hand she has requested we proceed with surgery.  Past Medical History  Diagnosis Date  . Asthma   . Hypertension   . Anxiety   . Snoring disorder     Pt stated my boyfriend always wakes me up and tells me to breathe  . Heart murmur   . CHF (congestive heart failure)   . Shortness of breath   . Bronchitis     hx of  . Bipolar affective disorder     Schizophrenia  . Depression   . GERD (gastroesophageal reflux disease)   . Arthritis   . Anginal pain   . Overactive bladder     Past Surgical History  Procedure Date  . Rotator cuff repair     Right shoulder  . Eye surgery     Metal plate in right eye  . Tubal ligation   . Gallstones reomved     History reviewed. No pertinent family history. Social History:  reports that she has been smoking Cigarettes.  She has a 2.25 pack-year smoking history. She has never used smokeless tobacco. She reports that she drinks alcohol. She reports that she uses illicit drugs (Marijuana).  Allergies: No Known Allergies  Medications Prior to Admission   Medication Sig Dispense Refill  . albuterol (PROVENTIL HFA;VENTOLIN HFA) 108 (90 BASE) MCG/ACT inhaler Inhale 2 puffs into the lungs every 6 (six) hours as needed. For shortness of breath      . amLODipine (NORVASC) 10 MG tablet Take 10 mg by mouth daily.      . ARIPiprazole (ABILIFY) 5 MG tablet Take 5 mg by mouth daily.      [name] dexlansoprazole (DEXILANT) 60 MG capsule Take 1 capsule (60 mg total) by mouth daily.      . diclofenac (VOLTAREN) 75 MG EC tablet Take 75 mg by mouth 2 (two) times daily.      . famotidine (PEPCID) 20 MG tablet One at bedtime  30 tablet  11  . furosemide (LASIX) 40 MG tablet Take 40 mg by mouth daily.      . methocarbamol (ROBAXIN) 500 MG tablet Take 500 mg by mouth every 6 (six) hours as needed. spasms      . Mometasone Furo-Formoterol Fum (DULERA) 200-5 MCG/ACT AERO Take 2 puffs first thing in am and then another 2 puffs about 12 hours later.      [name] omeprazole (PRILOSEC) 20 MG capsule Take 20 mg by mouth daily.      [name] oxyCODONE-acetaminophen (PERCOCET) 10-325 MG per tablet Take 1 tablet by mouth 3 (three) times daily as needed. For pain      . solifenacin (VESICARE) 5 MG tablet  Take 5 mg by mouth daily.      . traMADol (ULTRAM) 50 MG tablet Take 50-100 mg by mouth every 4 (four) hours as needed. For pain        No results found for this or any previous visit (from the past 48 hour(s)). No results found.  A comprehensive review of systems was negative.  Blood pressure 135/111, pulse 93, temperature 98 F (36.7 C), temperature source Oral, resp. rate 20, SpO2 99.00%.  The patient is awake alert and oriented. She is no facial asymmetry. Her gait is slow but nonantalgic. Reflexes are decreased but equal. She is normal strength and sensation. Assessment/Plan Impression is that of disc herniations at L4-5 and L5-S1 on the left. Plan is for a two-level lumbar microdiscectomy.  Reinaldo Meeker, MD 04/01/2012, 7:39 AM

## 2012-04-01 NOTE — Anesthesia Procedure Notes (Signed)
Procedure Name: Intubation Date/Time: 04/01/2012 7:50 AM Performed by: Elon Alas Pre-anesthesia Checklist: Patient identified, Timeout performed, Emergency Drugs available, Suction available and Patient being monitored Patient Re-evaluated:Patient Re-evaluated prior to inductionOxygen Delivery Method: Circle system utilized Preoxygenation: Pre-oxygenation with 100% oxygen Intubation Type: IV induction Ventilation: Mask ventilation without difficulty and Oral airway inserted - appropriate to patient size Laryngoscope Size: Mac and 3 Grade View: Grade I Tube type: Oral Tube size: 7.5 mm Number of attempts: 1 Airway Equipment and Method: Stylet and LTA kit utilized Placement Confirmation: positive ETCO2,  ETT inserted through vocal cords under direct vision and breath sounds checked- equal and bilateral Secured at: 22 cm Tube secured with: Tape Dental Injury: Teeth and Oropharynx as per pre-operative assessment

## 2012-04-01 NOTE — Preoperative (Signed)
Beta Blockers   Reason not to administer Beta Blockers:Not Applicable 

## 2012-04-02 NOTE — Discharge Summary (Signed)
Patient ID:  Lori Bean MRN: [id number] DOB/AGE: 42/08/1952 42 y.o.  Admit date: 04/01/2012  Discharge date: 04/02/2012  Admission Diagnoses:l45 hnp  Discharge Diagnoses: same  Discharged Condition: no leg pain  Hospital Course:lumbar discectomy  Consults: none  Significant Diagnostic Studies: mri  Treatments: surgery  Discharge Exam:  Blood pressure 134/71, pulse 72, temperature 98.2 F (36.8 C), temperature source Oral, resp. rate 16, height 5\' 2"  (1.575 m), weight 75.3 kg (166 lb 0.1 oz), SpO2 99.00%.  Incisional pain. No weakness  Disposition: home on percocet and diazepam   Medication List  As of 04/02/2012 8:36 AM    ASK your doctor about these medications          amLODipine 5 MG tablet      Commonly known as: NORVASC      Take 5 mg by mouth daily.      aspirin EC 81 MG tablet      Take 81 mg by mouth daily.      losartan-hydrochlorothiazide 100-12.5 MG per tablet      Commonly known as: HYZAAR      Take 1 tablet by mouth daily.      metFORMIN 500 MG tablet      Commonly known as: GLUCOPHAGE      Take 1,000 mg by mouth 2 (two) times daily with a meal.      pravastatin 40 MG tablet      Commonly known as: PRAVACHOL      Take 40 mg by mouth daily.          Signed:  Karn Cassis  04/02/2012, 8:36 AM

## 2012-04-02 NOTE — Progress Notes (Deleted)
Patient ID: Lori Bean, female   DOB: 1971/04/20, 41 y.o.   MRN: [id number] Wound dry, no weakness. Pain to swallow. Vs wnl.

## 2012-04-03 ENCOUNTER — Encounter: Payer: Self-pay | Admitting: Internal Medicine

## 2012-04-03 DIAGNOSIS — J45909 Unspecified asthma, uncomplicated: Secondary | ICD-10-CM | POA: Insufficient documentation

## 2012-04-03 NOTE — Assessment & Plan Note (Signed)
-   hfa 75% 03/29/12   - Spirometry wnl 03/29/12  DDX of  difficult airways managment all start with A and  include Adherence, Ace Inhibitors, Acid Reflux, Active Sinus Disease, Alpha 1 Antitripsin deficiency, Anxiety masquerading as Airways dz,  ABPA,  allergy(esp in young), Aspiration (esp in elderly), Adverse effects of DPI,  Active smokers, plus two Bs  = Bronchiectasis and Beta blocker use..and one C= CHF  In this case Adherence is the biggest issue and starts with  inability to use HFA effectively and also  understand that SABA treats the symptoms but doesn't get to the underlying problem (inflammation).  I used  the analogy of putting steroid cream on a rash to help explain the meaning of topical therapy and the need to get the drug to the target tissue.    The proper method of use, as well as anticipated side effects, of a metered-dose inhaler are discussed and demonstrated to the patient. Improved effectiveness after extensive coaching during this visit to a level of approximately  75%  ? Acid reflux > See instructions for specific recommendations which were reviewed directly with the patient who was given a copy with highlighter outlining the key components.

## 2012-04-03 NOTE — Progress Notes (Signed)
CSW contacted by Margarito Courser, about patient d/c. Patient was d/c at 9:18 but does not have a place to go with her kids. CSW contacted CSW director, [facility] Rife due to her involvement with the case on 7/19 trying to locate shelter. Rife instructed the CSW to not d/c until CPS recommends shelter. CPS was called previously, 04/01/12 to assist in this case.   CSW followed up and contacted the emergency CPS line 04/02/12. CPS case worker, Leonette Most, met with family and patient at 1400 and informed him that they had a place to stay until early next week. Leonette Most will follow up with the family next week to complete a full assessment. No other psychosocial needs, patient d/c to friend's home.  Lia Foyer, LCSWA [facility] Clinical Social Worker Contact #: [phone] (weekend)

## 2012-04-04 ENCOUNTER — Encounter (HOSPITAL_COMMUNITY): Payer: Self-pay | Admitting: Neurosurgery

## 2012-04-16 ENCOUNTER — Emergency Department (HOSPITAL_COMMUNITY)
Admission: EM | Admit: 2012-04-16 | Discharge: 2012-04-16 | Disposition: A | Discharge status: Home / Self Care | Payer: Medicaid Other | Attending: Emergency Medicine | Admitting: Emergency Medicine

## 2012-04-16 ENCOUNTER — Encounter (HOSPITAL_COMMUNITY): Payer: Self-pay | Admitting: Emergency Medicine

## 2012-04-16 DIAGNOSIS — K219 Gastro-esophageal reflux disease without esophagitis: Secondary | ICD-10-CM | POA: Insufficient documentation

## 2012-04-16 DIAGNOSIS — J45909 Unspecified asthma, uncomplicated: Secondary | ICD-10-CM | POA: Insufficient documentation

## 2012-04-16 DIAGNOSIS — I10 Essential (primary) hypertension: Secondary | ICD-10-CM | POA: Insufficient documentation

## 2012-04-16 DIAGNOSIS — Z9889 Other specified postprocedural states: Secondary | ICD-10-CM | POA: Insufficient documentation

## 2012-04-16 DIAGNOSIS — N390 Urinary tract infection, site not specified: Secondary | ICD-10-CM | POA: Insufficient documentation

## 2012-04-16 DIAGNOSIS — I509 Heart failure, unspecified: Secondary | ICD-10-CM | POA: Insufficient documentation

## 2012-04-16 DIAGNOSIS — F209 Schizophrenia, unspecified: Secondary | ICD-10-CM | POA: Insufficient documentation

## 2012-04-16 DIAGNOSIS — M545 Low back pain, unspecified: Secondary | ICD-10-CM | POA: Insufficient documentation

## 2012-04-16 DIAGNOSIS — F17200 Nicotine dependence, unspecified, uncomplicated: Secondary | ICD-10-CM | POA: Insufficient documentation

## 2012-04-16 DIAGNOSIS — F411 Generalized anxiety disorder: Secondary | ICD-10-CM | POA: Insufficient documentation

## 2012-04-16 DIAGNOSIS — Z79899 Other long term (current) drug therapy: Secondary | ICD-10-CM | POA: Insufficient documentation

## 2012-04-16 DIAGNOSIS — A599 Trichomoniasis, unspecified: Secondary | ICD-10-CM

## 2012-04-16 DIAGNOSIS — M549 Dorsalgia, unspecified: Secondary | ICD-10-CM

## 2012-04-16 HISTORY — DX: Schizophrenia, unspecified: F20.9

## 2012-04-16 LAB — URINALYSIS, ROUTINE W REFLEX MICROSCOPIC
Bilirubin Urine: NEGATIVE
Glucose, UA: NEGATIVE mg/dL
Ketones, ur: 15 mg/dL — AB
Nitrite: NEGATIVE
Protein, ur: NEGATIVE mg/dL
Specific Gravity, Urine: 1.037 — ABNORMAL HIGH (ref 1.005–1.030)
Urobilinogen, UA: 1 mg/dL (ref 0.0–1.0)
pH: 5.5 (ref 5.0–8.0)

## 2012-04-16 LAB — WET PREP, GENITAL
Clue Cells Wet Prep HPF POC: NONE SEEN
Yeast Wet Prep HPF POC: NONE SEEN

## 2012-04-16 LAB — URINE MICROSCOPIC-ADD ON

## 2012-04-16 MED ORDER — NAPROXEN 500 MG PO TABS: 500.0000 mg | ORAL_TABLET | Freq: Two times a day (BID) | ORAL | Status: DC

## 2012-04-16 MED ORDER — SULFAMETHOXAZOLE-TRIMETHOPRIM 800-160 MG PO TABS: 1.0000 | ORAL_TABLET | Freq: Two times a day (BID) | ORAL | Status: AC

## 2012-04-16 MED ORDER — OXYCODONE-ACETAMINOPHEN 5-325 MG PO TABS: 1.0000 | ORAL_TABLET | ORAL | Status: AC | PRN

## 2012-04-16 MED ORDER — METRONIDAZOLE 500 MG PO TABS: 500.0000 mg | ORAL_TABLET | Freq: Two times a day (BID) | ORAL | Status: AC

## 2012-04-16 MED ORDER — HYDROMORPHONE HCL PF 2 MG/ML IJ SOLN
2.0000 mg | Freq: Once | INTRAMUSCULAR | Status: AC
  Administered 2012-04-16: 2 mg via INTRAMUSCULAR
  Filled 2012-04-16: qty 1

## 2012-04-16 NOTE — ED Notes (Signed)
Pt had back surgery on July 19th at [facility].  Reports severe lower back pain that started while walking on Friday afternoon.
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
## 2012-04-18 ENCOUNTER — Encounter: Payer: Medicaid Other | Admitting: Internal Medicine

## 2012-04-18 LAB — GC/CHLAMYDIA PROBE AMP, GENITAL
Chlamydia, DNA Probe: NEGATIVE
GC Probe Amp, Genital: NEGATIVE

## 2012-04-18 NOTE — Progress Notes (Signed)
 This encounter was created in error - please disregard.

## 2012-04-22 ENCOUNTER — Encounter (HOSPITAL_BASED_OUTPATIENT_CLINIC_OR_DEPARTMENT_OTHER): Payer: Medicaid Other

## 2012-04-26 ENCOUNTER — Encounter (HOSPITAL_BASED_OUTPATIENT_CLINIC_OR_DEPARTMENT_OTHER): Payer: Medicaid Other | Attending: General Surgery

## 2012-04-26 DIAGNOSIS — S21209A Unspecified open wound of unspecified back wall of thorax without penetration into thoracic cavity, initial encounter: Secondary | ICD-10-CM | POA: Insufficient documentation

## 2012-04-26 DIAGNOSIS — Z79899 Other long term (current) drug therapy: Secondary | ICD-10-CM | POA: Insufficient documentation

## 2012-04-26 DIAGNOSIS — I10 Essential (primary) hypertension: Secondary | ICD-10-CM | POA: Insufficient documentation

## 2012-04-26 DIAGNOSIS — Y838 Other surgical procedures as the cause of abnormal reaction of the patient, or of later complication, without mention of misadventure at the time of the procedure: Secondary | ICD-10-CM | POA: Insufficient documentation

## 2012-04-26 DIAGNOSIS — I509 Heart failure, unspecified: Secondary | ICD-10-CM | POA: Insufficient documentation

## 2012-04-26 NOTE — H&P (Signed)
[name], Bean NO.:  [id number]  MEDICAL RECORD NO.:  [id number]  LOCATION:  FOOT                         FACILITY:  [facility]  PHYSICIAN:  Joanne Gavel, M.D.        DATE OF BIRTH:  [birth year]  DATE OF ADMISSION:  04/26/2012 DATE OF DISCHARGE:                             HISTORY & PHYSICAL   CHIEF COMPLAINT:  Wound to back.  HISTORY OF PRESENT ILLNESS:  The patient underwent back surgery on April 01, 2012.  The wound almost healed, but then spontaneously drained clear liquid.  There is some pain.  There has been no fever, chills, or sweating spells.  There has been no particular odor.  PAST MEDICAL HISTORY:  Significant for asthma, hypertension, heart murmur, congestive heart failure, for which she has been hospitalized several times, bronchitis, bipolar disorder, GERD, arthritis and anginal pain.  SURGICAL HISTORY:  Back surgery this year, rotator cuff repair, eye surgery, tubal ligation, and gallstone surgery.  Cigarettes none.  Alcohol none.  MEDICATIONS:  Norvasc, Hyzaar, metformin, pravastatin.  ALLERGIES:  None.  PHYSICAL EXAMINATION:  VITAL SIGNS:  Temperature 98, pulse 89, respirations 18, blood pressure 174/90. GENERAL:  Obese in no distress. CHEST:  Clear. HEART:  Appears to be in regular rhythm at present. ABDOMEN:  Not examined. EXTREMITIES:  Examination of the back reveals 2 new wounds in the midline, one is superior portion of the surgical wound and is relatively small at 0.2 x 0.2.  The other is lower and at the back wound is 1.5 x 0.5.  When the wounds probed, it is clear that there is a sizable cavity 2-3 inches beneath the skin.  There is no pus here.  My impression is that there is a sizable cavity which is closed over by skin.  PLAN OF TREATMENT:  I have spoken to Dr. Gerlene Fee.  He will take the patient to the operating room, open the skin incision and clean out the wound and then we will decide on therapy after that point.  She  may possibly be a candidate for VAC.     Joanne Gavel, M.D.     RA/MEDQ  D:  04/26/2012  T:  04/26/2012  Job:  [id number]

## 2012-04-27 ENCOUNTER — Encounter (HOSPITAL_COMMUNITY): Payer: Self-pay | Admitting: *Deleted

## 2012-04-28 ENCOUNTER — Other Ambulatory Visit: Payer: Self-pay | Admitting: Neurosurgery

## 2012-04-29 ENCOUNTER — Encounter (HOSPITAL_COMMUNITY)
Admission: RE | Disposition: A | Discharge status: Home / Self Care | Payer: Self-pay | Source: Ambulatory Visit | Attending: Neurosurgery

## 2012-04-29 ENCOUNTER — Ambulatory Visit (HOSPITAL_COMMUNITY): Payer: Medicaid Other | Admitting: Anesthesiology

## 2012-04-29 ENCOUNTER — Encounter (HOSPITAL_COMMUNITY): Payer: Self-pay | Admitting: Anesthesiology

## 2012-04-29 ENCOUNTER — Inpatient Hospital Stay (HOSPITAL_COMMUNITY)
Admission: RE | Admit: 2012-04-29 | Discharge: 2012-05-03 | DRG: 920 | Disposition: A | Discharge status: Home / Self Care | Payer: Medicaid Other | Source: Ambulatory Visit | Attending: Neurosurgery | Admitting: Neurosurgery

## 2012-04-29 ENCOUNTER — Encounter (HOSPITAL_COMMUNITY): Payer: Self-pay | Admitting: *Deleted

## 2012-04-29 DIAGNOSIS — Y838 Other surgical procedures as the cause of abnormal reaction of the patient, or of later complication, without mention of misadventure at the time of the procedure: Secondary | ICD-10-CM | POA: Diagnosis present

## 2012-04-29 DIAGNOSIS — Z6841 Body Mass Index (BMI) 40.0 and over, adult: Secondary | ICD-10-CM

## 2012-04-29 DIAGNOSIS — T8131XA Disruption of external operation (surgical) wound, not elsewhere classified, initial encounter: Secondary | ICD-10-CM

## 2012-04-29 DIAGNOSIS — I10 Essential (primary) hypertension: Secondary | ICD-10-CM | POA: Diagnosis present

## 2012-04-29 DIAGNOSIS — J45909 Unspecified asthma, uncomplicated: Secondary | ICD-10-CM | POA: Diagnosis present

## 2012-04-29 DIAGNOSIS — Z79899 Other long term (current) drug therapy: Secondary | ICD-10-CM

## 2012-04-29 DIAGNOSIS — K219 Gastro-esophageal reflux disease without esophagitis: Secondary | ICD-10-CM | POA: Diagnosis present

## 2012-04-29 DIAGNOSIS — M1990 Unspecified osteoarthritis, unspecified site: Secondary | ICD-10-CM | POA: Diagnosis present

## 2012-04-29 DIAGNOSIS — F17200 Nicotine dependence, unspecified, uncomplicated: Secondary | ICD-10-CM | POA: Diagnosis present

## 2012-04-29 DIAGNOSIS — I509 Heart failure, unspecified: Secondary | ICD-10-CM | POA: Diagnosis present

## 2012-04-29 DIAGNOSIS — T8189XA Other complications of procedures, not elsewhere classified, initial encounter: Principal | ICD-10-CM | POA: Diagnosis present

## 2012-04-29 DIAGNOSIS — M129 Arthropathy, unspecified: Secondary | ICD-10-CM | POA: Diagnosis present

## 2012-04-29 DIAGNOSIS — F209 Schizophrenia, unspecified: Secondary | ICD-10-CM | POA: Diagnosis present

## 2012-04-29 HISTORY — PX: LUMBAR WOUND DEBRIDEMENT: SHX1988

## 2012-04-29 LAB — BASIC METABOLIC PANEL
BUN: 21 mg/dL (ref 6–23)
CO2: 24 mEq/L (ref 19–32)
Calcium: 9.7 mg/dL (ref 8.4–10.5)
Chloride: 101 mEq/L (ref 96–112)
Creatinine, Ser: 0.78 mg/dL (ref 0.50–1.10)
GFR calc Af Amer: >90 mL/min (ref >90)
GFR calc non Af Amer: >90 mL/min (ref >90)
Glucose, Bld: 110 mg/dL — ABNORMAL HIGH (ref 70–99)
Potassium: 3.9 mEq/L (ref 3.5–5.1)
Sodium: 137 mEq/L (ref 135–145)

## 2012-04-29 LAB — CBC
HCT: 36.2 % (ref 36.0–46.0)
Hemoglobin: 12.1 g/dL (ref 12.0–15.0)
MCH: 30.4 pg (ref 26.0–34.0)
MCHC: 33.4 g/dL (ref 30.0–36.0)
MCV: 91 fL (ref 78.0–100.0)
Platelets: 335 10*3/uL (ref 150–400)
RBC: 3.98 MIL/uL (ref 3.87–5.11)
RDW: 13.2 % (ref 11.5–15.5)
WBC: 12 10*3/uL — ABNORMAL HIGH (ref 4.0–10.5)

## 2012-04-29 LAB — GRAM STAIN

## 2012-04-29 LAB — HCG, SERUM, QUALITATIVE: Preg, Serum: NEGATIVE

## 2012-04-29 LAB — SURGICAL PCR SCREEN
MRSA, PCR: NEGATIVE
Staphylococcus aureus: NEGATIVE

## 2012-04-29 SURGERY — LUMBAR WOUND DEBRIDEMENT
Anesthesia: General | Site: Spine Lumbar | Wound class: Dirty or Infected

## 2012-04-29 MED ORDER — HYDROMORPHONE HCL PF 1 MG/ML IJ SOLN
0.2500 mg | INTRAMUSCULAR | Status: DC | PRN
  Administered 2012-04-29: 0.5 mg via INTRAVENOUS

## 2012-04-29 MED ORDER — ACETAMINOPHEN 650 MG RE SUPP: 650.0000 mg | RECTAL | Status: DC | PRN

## 2012-04-29 MED ORDER — SODIUM CHLORIDE 0.9 % IV SOLN
INTRAVENOUS | Status: AC
  Filled 2012-04-29: qty 500

## 2012-04-29 MED ORDER — SODIUM CHLORIDE 0.9 % IV SOLN: 250.0000 mL | INTRAVENOUS | Status: DC

## 2012-04-29 MED ORDER — ONDANSETRON HCL 4 MG/2ML IJ SOLN: 4.0000 mg | Freq: Once | INTRAMUSCULAR | Status: DC | PRN

## 2012-04-29 MED ORDER — 0.9 % SODIUM CHLORIDE (POUR BTL) OPTIME
TOPICAL | Status: DC | PRN
  Administered 2012-04-29: 1000 mL

## 2012-04-29 MED ORDER — DIAZEPAM 5 MG PO TABS
5.0000 mg | ORAL_TABLET | Freq: Four times a day (QID) | ORAL | Status: DC | PRN
  Administered 2012-04-29 – 2012-05-03 (×4): 5 mg via ORAL
  Filled 2012-04-29 (×4): qty 1

## 2012-04-29 MED ORDER — DEXTROSE 5 % IV SOLN
INTRAVENOUS | Status: DC | PRN
  Administered 2012-04-29: 10:00:00 via INTRAVENOUS

## 2012-04-29 MED ORDER — BACITRACIN 50000 UNITS IM SOLR
INTRAMUSCULAR | Status: AC
  Filled 2012-04-29: qty 1

## 2012-04-29 MED ORDER — HYDROMORPHONE HCL PF 1 MG/ML IJ SOLN
INTRAMUSCULAR | Status: AC
  Filled 2012-04-29: qty 1

## 2012-04-29 MED ORDER — ACETAMINOPHEN 325 MG PO TABS: 650.0000 mg | ORAL_TABLET | ORAL | Status: DC | PRN

## 2012-04-29 MED ORDER — PANTOPRAZOLE SODIUM 40 MG PO TBEC
40.0000 mg | DELAYED_RELEASE_TABLET | Freq: Every day | ORAL | Status: DC
  Administered 2012-04-29 – 2012-05-03 (×3): 40 mg via ORAL
  Filled 2012-04-29 (×3): qty 1

## 2012-04-29 MED ORDER — HEMOSTATIC AGENTS (NO CHARGE) OPTIME
TOPICAL | Status: DC | PRN
  Administered 2012-04-29: 1 via TOPICAL

## 2012-04-29 MED ORDER — CEFAZOLIN SODIUM-DEXTROSE 2-3 GM-% IV SOLR
2.0000 g | INTRAVENOUS | Status: AC
  Administered 2012-04-29: 2 g via INTRAVENOUS

## 2012-04-29 MED ORDER — NEOSTIGMINE METHYLSULFATE 1 MG/ML IJ SOLN
INTRAMUSCULAR | Status: DC | PRN
  Administered 2012-04-29: 3.5 mg via INTRAVENOUS

## 2012-04-29 MED ORDER — SUCCINYLCHOLINE CHLORIDE 20 MG/ML IJ SOLN
INTRAMUSCULAR | Status: DC | PRN
  Administered 2012-04-29: 120 mg via INTRAVENOUS

## 2012-04-29 MED ORDER — LACTATED RINGERS IV SOLN
INTRAVENOUS | Status: DC | PRN
  Administered 2012-04-29: 09:00:00 via INTRAVENOUS

## 2012-04-29 MED ORDER — SODIUM CHLORIDE 0.9 % IJ SOLN: 3.0000 mL | INTRAMUSCULAR | Status: DC | PRN

## 2012-04-29 MED ORDER — HYDROMORPHONE HCL PF 1 MG/ML IJ SOLN
1.0000 mg | INTRAMUSCULAR | Status: DC | PRN
  Administered 2012-04-29: 1 mg via INTRAMUSCULAR
  Filled 2012-04-29: qty 1

## 2012-04-29 MED ORDER — THROMBIN 5000 UNITS EX KIT
PACK | CUTANEOUS | Status: DC | PRN
  Administered 2012-04-29: 5000 [IU] via TOPICAL

## 2012-04-29 MED ORDER — MUPIROCIN 2 % EX OINT
TOPICAL_OINTMENT | Freq: Once | CUTANEOUS | Status: AC
  Administered 2012-04-29: 08:00:00 via NASAL

## 2012-04-29 MED ORDER — PNEUMOCOCCAL VAC POLYVALENT 25 MCG/0.5ML IJ INJ
0.5000 mL | INJECTION | INTRAMUSCULAR | Status: AC
  Filled 2012-04-29: qty 0.5

## 2012-04-29 MED ORDER — AMLODIPINE BESYLATE 10 MG PO TABS
10.0000 mg | ORAL_TABLET | Freq: Every day | ORAL | Status: DC
  Administered 2012-04-29 – 2012-05-03 (×5): 10 mg via ORAL
  Filled 2012-04-29 (×5): qty 1

## 2012-04-29 MED ORDER — SODIUM CHLORIDE 0.9 % IJ SOLN
3.0000 mL | Freq: Two times a day (BID) | INTRAMUSCULAR | Status: DC
  Administered 2012-04-29 – 2012-05-03 (×4): 3 mL via INTRAVENOUS

## 2012-04-29 MED ORDER — GLYCOPYRROLATE 0.2 MG/ML IJ SOLN
INTRAMUSCULAR | Status: DC | PRN
  Administered 2012-04-29: .4 mg via INTRAVENOUS

## 2012-04-29 MED ORDER — MUPIROCIN 2 % EX OINT
TOPICAL_OINTMENT | CUTANEOUS | Status: AC
  Filled 2012-04-29: qty 22

## 2012-04-29 MED ORDER — LIDOCAINE HCL (CARDIAC) 20 MG/ML IV SOLN
INTRAVENOUS | Status: DC | PRN
  Administered 2012-04-29: 100 mg via INTRAVENOUS

## 2012-04-29 MED ORDER — PROPOFOL 10 MG/ML IV EMUL
INTRAVENOUS | Status: DC | PRN
  Administered 2012-04-29: 200 mg via INTRAVENOUS

## 2012-04-29 MED ORDER — PHENOL 1.4 % MT LIQD: 1.0000 | OROMUCOSAL | Status: DC | PRN

## 2012-04-29 MED ORDER — OXYCODONE-ACETAMINOPHEN 5-325 MG PO TABS
1.0000 | ORAL_TABLET | ORAL | Status: DC | PRN
  Administered 2012-04-29 – 2012-05-03 (×19): 2 via ORAL
  Filled 2012-04-29 (×19): qty 2

## 2012-04-29 MED ORDER — CEFAZOLIN SODIUM 1-5 GM-% IV SOLN
1.0000 g | Freq: Three times a day (TID) | INTRAVENOUS | Status: AC
  Administered 2012-04-29 (×2): 1 g via INTRAVENOUS
  Filled 2012-04-29 (×3): qty 50

## 2012-04-29 MED ORDER — ROCURONIUM BROMIDE 100 MG/10ML IV SOLN
INTRAVENOUS | Status: DC | PRN
  Administered 2012-04-29: 25 mg via INTRAVENOUS

## 2012-04-29 MED ORDER — MENTHOL 3 MG MT LOZG: 1.0000 | LOZENGE | OROMUCOSAL | Status: DC | PRN

## 2012-04-29 MED ORDER — FUROSEMIDE 40 MG PO TABS
40.0000 mg | ORAL_TABLET | Freq: Every day | ORAL | Status: DC
  Administered 2012-04-29 – 2012-05-03 (×5): 40 mg via ORAL
  Filled 2012-04-29 (×5): qty 1

## 2012-04-29 MED ORDER — KCL IN DEXTROSE-NACL 20-5-0.45 MEQ/L-%-% IV SOLN
80.0000 mL/h | INTRAVENOUS | Status: DC
  Administered 2012-04-29 – 2012-05-01 (×3): 80 mL/h via INTRAVENOUS
  Filled 2012-04-29 (×10): qty 1000

## 2012-04-29 MED ORDER — BUPIVACAINE HCL (PF) 0.5 % IJ SOLN
INTRAMUSCULAR | Status: DC | PRN
  Administered 2012-04-29: 20 mL

## 2012-04-29 MED ORDER — ONDANSETRON HCL 4 MG/2ML IJ SOLN
INTRAMUSCULAR | Status: DC | PRN
  Administered 2012-04-29: 4 mg via INTRAVENOUS

## 2012-04-29 MED ORDER — ARIPIPRAZOLE 5 MG PO TABS
5.0000 mg | ORAL_TABLET | Freq: Every day | ORAL | Status: DC
  Administered 2012-04-29 – 2012-05-03 (×5): 5 mg via ORAL
  Filled 2012-04-29 (×5): qty 1

## 2012-04-29 MED ORDER — FENTANYL CITRATE 0.05 MG/ML IJ SOLN
INTRAMUSCULAR | Status: DC | PRN
  Administered 2012-04-29: 100 ug via INTRAVENOUS
  Administered 2012-04-29: 50 ug via INTRAVENOUS
  Administered 2012-04-29: 100 ug via INTRAVENOUS

## 2012-04-29 MED ORDER — SODIUM CHLORIDE 0.9 % IR SOLN
Status: DC | PRN
  Administered 2012-04-29 (×4)

## 2012-04-29 MED ORDER — SODIUM CHLORIDE 0.9 % IV SOLN
INTRAVENOUS | Status: AC
  Filled 2012-04-29: qty 1500

## 2012-04-29 MED ORDER — CEFAZOLIN SODIUM-DEXTROSE 2-3 GM-% IV SOLR
INTRAVENOUS | Status: AC
  Filled 2012-04-29: qty 50

## 2012-04-29 MED ORDER — ONDANSETRON HCL 4 MG/2ML IJ SOLN: 4.0000 mg | INTRAMUSCULAR | Status: DC | PRN

## 2012-04-29 MED ORDER — ALBUTEROL SULFATE HFA 108 (90 BASE) MCG/ACT IN AERS
2.0000 | INHALATION_SPRAY | Freq: Four times a day (QID) | RESPIRATORY_TRACT | Status: DC | PRN
  Filled 2012-04-29: qty 6.7

## 2012-04-29 MED ORDER — BACITRACIN 50000 UNITS IM SOLR
INTRAMUSCULAR | Status: AC
  Filled 2012-04-29: qty 3

## 2012-04-29 SURGICAL SUPPLY — 45 items
BAG DECANTER FOR FLEXI CONT (MISCELLANEOUS) ×2 IMPLANT
BENZOIN TINCTURE PRP APPL 2/3 (GAUZE/BANDAGES/DRESSINGS) ×2 IMPLANT
BLADE SURG ROTATE 9660 (MISCELLANEOUS) IMPLANT
BRUSH SCRUB EZ PLAIN DRY (MISCELLANEOUS) ×2 IMPLANT
CANISTER SUCTION 2500CC (MISCELLANEOUS) ×6 IMPLANT
CLEANER TIP ELECTROSURG 2X2 (MISCELLANEOUS) IMPLANT
CLOTH BEACON ORANGE TIMEOUT ST (SAFETY) ×2 IMPLANT
CONT SPEC 4OZ CLIKSEAL STRL BL (MISCELLANEOUS) ×2 IMPLANT
DRAPE LAPAROTOMY 100X72X124 (DRAPES) ×2 IMPLANT
DRESSING TELFA 8X3 (GAUZE/BANDAGES/DRESSINGS) ×2 IMPLANT
ELECT REM PT RETURN 9FT ADLT (ELECTROSURGICAL) ×2
ELECTRODE REM PT RTRN 9FT ADLT (ELECTROSURGICAL) ×1 IMPLANT
EVACUATOR 1/8 PVC DRAIN (DRAIN) ×2 IMPLANT
GAUZE SPONGE 4X4 16PLY XRAY LF (GAUZE/BANDAGES/DRESSINGS) IMPLANT
GLOVE BIOGEL PI IND STRL 7.0 (GLOVE) ×3 IMPLANT
GLOVE BIOGEL PI INDICATOR 7.0 (GLOVE) ×3
GLOVE ECLIPSE 7.5 STRL STRAW (GLOVE) ×2 IMPLANT
GLOVE EXAM NITRILE LRG STRL (GLOVE) IMPLANT
GLOVE EXAM NITRILE XL STR (GLOVE) IMPLANT
GLOVE EXAM NITRILE XS STR PU (GLOVE) IMPLANT
GLOVE SURG SS PI 7.0 STRL IVOR (GLOVE) ×6 IMPLANT
GOWN BRE IMP SLV AUR LG STRL (GOWN DISPOSABLE) ×2 IMPLANT
GOWN BRE IMP SLV AUR XL STRL (GOWN DISPOSABLE) ×4 IMPLANT
GOWN STRL REIN 2XL LVL4 (GOWN DISPOSABLE) IMPLANT
HANDPIECE INTERPULSE COAX TIP (DISPOSABLE) ×1
KIT BASIN OR (CUSTOM PROCEDURE TRAY) ×2 IMPLANT
KIT ROOM TURNOVER OR (KITS) ×2 IMPLANT
NEEDLE HYPO 22GX1.5 SAFETY (NEEDLE) ×2 IMPLANT
NS IRRIG 1000ML POUR BTL (IV SOLUTION) ×2 IMPLANT
PACK LAMINECTOMY NEURO (CUSTOM PROCEDURE TRAY) ×2 IMPLANT
PAD ARMBOARD 7.5X6 YLW CONV (MISCELLANEOUS) ×10 IMPLANT
SET HNDPC FAN SPRY TIP SCT (DISPOSABLE) ×1 IMPLANT
SPONGE GAUZE 4X4 12PLY (GAUZE/BANDAGES/DRESSINGS) ×2 IMPLANT
SPONGE SURGIFOAM ABS GEL SZ50 (HEMOSTASIS) ×2 IMPLANT
STAPLER SKIN PROX WIDE 3.9 (STAPLE) IMPLANT
STRIP CLOSURE SKIN 1/2X4 (GAUZE/BANDAGES/DRESSINGS) IMPLANT
SUT ETHILON 2 0 FS 18 (SUTURE) ×2 IMPLANT
SUT VIC AB 2-0 OS6 18 (SUTURE) ×6 IMPLANT
SUT VIC AB 3-0 CP2 18 (SUTURE) ×2 IMPLANT
SWAB CULTURE LIQ STUART DBL (MISCELLANEOUS) ×2 IMPLANT
SYR 20ML ECCENTRIC (SYRINGE) ×2 IMPLANT
TOWEL OR 17X24 6PK STRL BLUE (TOWEL DISPOSABLE) ×2 IMPLANT
TOWEL OR 17X26 10 PK STRL BLUE (TOWEL DISPOSABLE) ×2 IMPLANT
TUBE ANAEROBIC SPECIMEN COL (MISCELLANEOUS) ×2 IMPLANT
WATER STERILE IRR 1000ML POUR (IV SOLUTION) ×2 IMPLANT

## 2012-04-29 NOTE — Transfer of Care (Signed)
Immediate Anesthesia Transfer of Care Note  Patient: Lori Bean  Procedure(s) Performed: Procedure(s) (LRB): LUMBAR WOUND DEBRIDEMENT (N/A)  Patient Location: PACU  Anesthesia Type: General  Level of Consciousness: awake and sedated  Airway & Oxygen Therapy: Patient Spontanous Breathing and Patient connected to nasal cannula oxygen  Post-op Assessment: Report given to PACU RN and Post -op Vital signs reviewed and stable  Post vital signs: Reviewed and stable  Complications: No apparent anesthesia complications

## 2012-04-29 NOTE — Preoperative (Signed)
Beta Blockers   Reason not to administer Beta Blockers:Not Applicable 

## 2012-04-29 NOTE — Anesthesia Procedure Notes (Signed)
Procedure Name: Intubation Date/Time: 04/29/2012 9:38 AM Performed by: Nicholos Johns Pre-anesthesia Checklist: Patient identified, Emergency Drugs available, Suction available, Patient being monitored and Timeout performed Patient Re-evaluated:Patient Re-evaluated prior to inductionOxygen Delivery Method: Circle system utilized Preoxygenation: Pre-oxygenation with 100% oxygen Intubation Type: IV induction Ventilation: Mask ventilation without difficulty Laryngoscope Size: 3 and Mac Grade View: Grade I Tube size: 7.5 mm Number of attempts: 1 Airway Equipment and Method: Stylet Placement Confirmation: ETT inserted through vocal cords under direct vision,  positive ETCO2 and breath sounds checked- equal and bilateral Secured at: 22 cm Tube secured with: Tape Dental Injury: Teeth and Oropharynx as per pre-operative assessment

## 2012-04-29 NOTE — Anesthesia Postprocedure Evaluation (Signed)
  Anesthesia Post-op Note  Patient: Lori Bean  Procedure(s) Performed: Procedure(s) (LRB): LUMBAR WOUND DEBRIDEMENT (N/A)  Patient Location: PACU  Anesthesia Type: General  Level of Consciousness: awake, oriented, sedated and patient cooperative  Airway and Oxygen Therapy: Patient Spontanous Breathing and Patient connected to nasal cannula oxygen  Post-op Pain: mild  Post-op Assessment: Post-op Vital signs reviewed, Patient's Cardiovascular Status Stable, Respiratory Function Stable, Patent Airway, No signs of Nausea or vomiting and Pain level controlled  Post-op Vital Signs: stable  Complications: No apparent anesthesia complications

## 2012-04-29 NOTE — H&P (Signed)
Lori Bean is an 41 y.o. female.   Chief Complaint: Nonhealing wound HPI: The patient is a 41 year old female who had back surgery approximately 3 weeks ago. Her pain is markedly improved but her wound has refused to heal completely. She was sent to the wound care clinic and they felt that she had a large cavity beneath the healed area of the incision and that prior to them doing any treatment in need to be opened up and washed out we closed. There is no obvious evidence of infection there was more of a wound nonunion. The plan at this time is for the patient to be taken to the operating room for washout of the wound and reclosure with some drains left in the soft tissue. I had a long discussion with her regarding the risks and benefits of surgical intervention. Risks discussed primarily include bleeding and infection and continued nonhealing which may require her to be seen at the wound care clinic once again. She understands the risks and benefits were placed to proceed with surgery and now comes for opening and reclosure of lumbar wound.  Past Medical History  Diagnosis Date  . Asthma   . Hypertension   . Anxiety   . Snoring disorder     Pt stated my boyfriend always wakes me up and tells me to breathe  . Heart murmur   . CHF (congestive heart failure)   . Shortness of breath   . Bronchitis     hx of  . Bipolar affective disorder     Schizophrenia  . Depression   . GERD (gastroesophageal reflux disease)   . Arthritis   . Anginal pain   . Overactive bladder   . Schizophrenia     Past Surgical History  Procedure Date  . Rotator cuff repair     Right shoulder  . Eye surgery     Metal plate in right eye  . Tubal ligation   . Gallstones reomved   . Lumbar laminectomy/decompression microdiscectomy 04/01/2012    Procedure: LUMBAR LAMINECTOMY/DECOMPRESSION MICRODISCECTOMY 2 LEVELS;  Surgeon: Reinaldo Meeker, MD;  Location: [facility];  Service: Neurosurgery;  Laterality:  Left;  Lumbar four-five, lumbar five sacral one microdiscectomy   . Back surgery     History reviewed. No pertinent family history. Social History:  reports that she has been smoking Cigarettes.  She has a 2.25 pack-year smoking history. She has never used smokeless tobacco. She reports that she drinks alcohol. She reports that she uses illicit drugs (Marijuana).  Allergies: No Known Allergies  Medications Prior to Admission  Medication Sig Dispense Refill  . albuterol (PROVENTIL HFA;VENTOLIN HFA) 108 (90 BASE) MCG/ACT inhaler Inhale 2 puffs into the lungs every 6 (six) hours as needed. For shortness of breath      . amLODipine (NORVASC) 10 MG tablet Take 10 mg by mouth daily.      . ARIPiprazole (ABILIFY) 5 MG tablet Take 5 mg by mouth daily.      . carisoprodol (SOMA) 350 MG tablet Take 350 mg by mouth 2 (two) times daily. For muscle spasms      . dexlansoprazole (DEXILANT) 60 MG capsule Take 60 mg by mouth daily.      . diazepam (VALIUM) 5 MG tablet Take 5 mg by mouth every 6 (six) hours as needed. For spasms      . diclofenac (VOLTAREN) 75 MG EC tablet Take 75 mg by mouth 2 (two) times daily.      [name]  famotidine (PEPCID) 20 MG tablet Take 20 mg by mouth at bedtime as needed. One at bedtime      . furosemide (LASIX) 40 MG tablet Take 40 mg by mouth daily.      . methocarbamol (ROBAXIN) 500 MG tablet Take 500 mg by mouth every 6 (six) hours as needed. spasms      . Mometasone Furo-Formoterol Fum 200-5 MCG/ACT AERO Inhale 2 puffs into the lungs 2 (two) times daily. Take 2 puffs first thing in am and then another 2 puffs about 12 hours later.      . naproxen (NAPROSYN) 500 MG tablet Take 500 mg by mouth 2 (two) times daily with a meal.      . omeprazole (PRILOSEC) 20 MG capsule Take 20 mg by mouth daily.      [name] oxyCODONE-acetaminophen (PERCOCET) 10-325 MG per tablet Take 1 tablet by mouth 3 (three) times daily as needed. For pain      . solifenacin (VESICARE) 5 MG tablet Take 5 mg by mouth  daily.      . traMADol (ULTRAM) 50 MG tablet Take 50-100 mg by mouth every 4 (four) hours as needed. For pain      . DISCONTD: famotidine (PEPCID) 20 MG tablet One at bedtime  30 tablet  11  . DISCONTD: Mometasone Furo-Formoterol Fum (DULERA) 200-5 MCG/ACT AERO Take 2 puffs first thing in am and then another 2 puffs about 12 hours later.      [name] DISCONTD: naproxen (NAPROSYN) 500 MG tablet Take 1 tablet (500 mg total) by mouth 2 (two) times daily with a meal.  30 tablet  0  . cephALEXin (KEFLEX) 500 MG capsule Take 500 mg by mouth 4 (four) times daily.      [name] dexlansoprazole (DEXILANT) 60 MG capsule Take 1 capsule (60 mg total) by mouth daily.        Results for orders placed during the hospital encounter of 04/29/12 (from the past 48 hour(s))  BASIC METABOLIC PANEL     Status: Abnormal   Collection Time   04/29/12  8:04 AM      Component Value Range Comment   Sodium 137  135 - 145 mEq/L    Potassium 3.9  3.5 - 5.1 mEq/L    Chloride 101  96 - 112 mEq/L    CO2 24  19 - 32 mEq/L    Glucose, Bld 110 (*) 70 - 99 mg/dL    BUN 21  6 - 23 mg/dL    Creatinine, Ser 1.61  0.50 - 1.10 mg/dL    Calcium 9.7  8.4 - 09.6 mg/dL    GFR calc non Af Amer >90  >90 mL/min    GFR calc Af Amer >90  >90 mL/min   CBC     Status: Abnormal   Collection Time   04/29/12  8:04 AM      Component Value Range Comment   WBC 12.0 (*) 4.0 - 10.5 K/uL    RBC 3.98  3.87 - 5.11 MIL/uL    Hemoglobin 12.1  12.0 - 15.0 g/dL    HCT 04.5  40.9 - 81.1 %    MCV 91.0  78.0 - 100.0 fL    MCH 30.4  26.0 - 34.0 pg    MCHC 33.4  30.0 - 36.0 g/dL    RDW 91.4  78.2 - 95.6 %    Platelets 335  150 - 400 K/uL   HCG, SERUM, QUALITATIVE     Status: Normal  Collection Time   04/29/12  8:04 AM      Component Value Range Comment   Preg, Serum NEGATIVE  NEGATIVE    No results found.  A comprehensive review of systems was negative.  Blood pressure 126/84, pulse 92, temperature 98 F (36.7 C), temperature source Oral, resp. rate 20,  height 5\' 2"  (1.575 m), weight 100.2 kg (220 lb 14.4 oz), last menstrual period 03/11/2012, SpO2 100.00%.  Incision/Wound: the patient has an incision that is healed up out of the 80% of the bottom 20% on is nonhealing. There's some serous drainage but on the evidence of obvious infection. There is no neurologic deficit noted this time. Assessment/Plan Impression is that of a nonhealing lumbar wound and the plan is for reopening washout and closed in the lumbar incision.  Reinaldo Meeker, MD 04/29/2012, 9:21 AM

## 2012-04-29 NOTE — Op Note (Signed)
Preop diagnosis: Nonhealing lumbar wound Postop diagnosis: Same Procedure: Irrigation and reclosure of lumbar wound Surgeon: Gwynn Chalker  After being placed the prone position the patient's back was prepped and draped in the usual sterile fashion. Previous lumbar incision was opened and immediate disc space was encountered. There was some serous fluid within the space but no evidence of obvious infection. The incision was carried down to the fascia and the fascia was well healed and did not appear to be weakened or dehisced. It was therefore elected not to open up further down. Removed the dead-looking tissue and then irrigated copiously with the Pulsavac with antibiotic irrigation. We left a drain just above the fascia and brought out through a separate stab wound incision. We then closed the wound in multiple layers of Vicryl on the muscle fascia subcutaneous and subcuticular tissues. We used a running locking nylon on the skin. A sterile dressing was then applied and the patient was extubated and taken to recovery room in stable condition. Cultures were taken and were pending at the time of dictation.

## 2012-04-29 NOTE — Progress Notes (Signed)
Dr. Gerlene Fee made aware of lab results, no organisms seen, few monos and polys

## 2012-04-29 NOTE — Anesthesia Preprocedure Evaluation (Addendum)
Anesthesia Evaluation  Patient identified by MRN, date of birth, ID band Patient awake    Reviewed: Allergy & Precautions, H&P , NPO status , Patient's Chart, lab work & pertinent test results  Airway Mallampati: I TM Distance: >3 FB Neck ROM: full    Dental  (+) Teeth Intact and Dental Advisory Given   Pulmonary shortness of breath, asthma ,          Cardiovascular hypertension, Pt. on medications + angina +CHF Rhythm:regular Rate:Normal     Neuro/Psych PSYCHIATRIC DISORDERS Anxiety Depression Schizophrenia    GI/Hepatic Neg liver ROS, GERD-  Medicated and Controlled,  Endo/Other  Morbid obesity  Renal/GU negative Renal ROS     Musculoskeletal  (+) Arthritis -, Osteoarthritis,    Abdominal   Peds  Hematology negative hematology ROS (+)   Anesthesia Other Findings   Reproductive/Obstetrics                         Anesthesia Physical Anesthesia Plan  ASA: III  Anesthesia Plan: General   Post-op Pain Management:    Induction: Intravenous  Airway Management Planned: Oral ETT  Additional Equipment:   Intra-op Plan:   Post-operative Plan: Extubation in OR  Informed Consent: I have reviewed the patients History and Physical, chart, labs and discussed the procedure including the risks, benefits and alternatives for the proposed anesthesia with the patient or authorized representative who has indicated his/her understanding and acceptance.   Dental advisory given  Plan Discussed with: CRNA, Anesthesiologist and Surgeon  Anesthesia Plan Comments:        Anesthesia Quick Evaluation

## 2012-04-30 MED ORDER — SODIUM CHLORIDE 0.9 % IJ SOLN
10.0000 mL | Freq: Two times a day (BID) | INTRAMUSCULAR | Status: DC
  Administered 2012-05-02: 10 mL

## 2012-04-30 MED ORDER — SODIUM CHLORIDE 0.9 % IJ SOLN
10.0000 mL | INTRAMUSCULAR | Status: DC | PRN
  Administered 2012-05-03: 10 mL

## 2012-04-30 MED ORDER — CEFAZOLIN SODIUM-DEXTROSE 2-3 GM-% IV SOLR
2.0000 g | Freq: Three times a day (TID) | INTRAVENOUS | Status: DC
  Administered 2012-04-30 – 2012-05-03 (×10): 2 g via INTRAVENOUS
  Filled 2012-04-30 (×13): qty 50

## 2012-04-30 NOTE — Progress Notes (Signed)
Pt requested "something for pain" but on assessing pts' pains , pt fell asleep and began snoring. We will give pain medication when pt is awake and able to swallow same.

## 2012-04-30 NOTE — Progress Notes (Signed)
PICC line now infusing. No complaints per patient.  Minor, Lori Bean

## 2012-04-30 NOTE — Progress Notes (Signed)
Patient ID: Lori Bean, female   DOB: [birth year], 41 y.o.   MRN: [id number] Subjective: Patient reports feeling good except for incisional pain  Objective: Vital signs in last 24 hours: Temp:  [96.9 F (36.1 C)-98.3 F (36.8 C)] 98.1 F (36.7 C) (08/17 0528) Pulse Rate:  [72-89] 80  (08/17 0528) Resp:  [13-30] 20  (08/17 0528) BP: (98-128)/(53-80) 111/65 mmHg (08/17 0528) SpO2:  [95 %-100 %] 99 % (08/17 0528)  Intake/Output from previous day: 08/16 0701 - 08/17 0700 In: 670 [P.O.:120; I.V.:550] Out: 25 [Blood:25] Intake/Output this shift:    Wound:clean and dry, drain working well  Lab Results:  Basename 04/29/12 0804  WBC 12.0*  HGB 12.1  HCT 36.2  PLT 335   BMET  Basename 04/29/12 0804  NA 137  K 3.9  CL 101  CO2 24  GLUCOSE 110*  BUN 21  CREATININE 0.78  CALCIUM 9.7    Studies/Results: No results found.  Assessment/Plan: Doing well. No growth on cultures, but will still give 2 weeks of IV ancef. Will get PICC line today.  LOS: 1 day  as above   Reinaldo Meeker, MD 04/30/2012, 8:59 AM

## 2012-04-30 NOTE — Progress Notes (Signed)
Received pt asleep in bed. No concern noted at this time. We will continue to monitor.

## 2012-05-01 LAB — WOUND CULTURE: Culture: NO GROWTH

## 2012-05-01 NOTE — Progress Notes (Signed)
Patient ID: Lori Bean, female   DOB: [birth year], 41 y.o.   MRN: [id number] Subjective: Patient reports doing ok  Objective: Vital signs in last 24 hours: Temp:  [98 F (36.7 C)-98.3 F (36.8 C)] 98 F (36.7 C) (08/18 0609) Pulse Rate:  [78-90] 78  (08/18 0609) Resp:  [20-22] 22  (08/18 0609) BP: (111-132)/(51-78) 111/63 mmHg (08/18 0609) SpO2:  [96 %-99 %] 96 % (08/18 0609)  Intake/Output from previous day: 08/17 0701 - 08/18 0700 In: -  Out: 15 [Drains:15] Intake/Output this shift:    Wound:clean and dry  Lab Results:  [facility] 04/29/12 0804  WBC 12.0*  HGB 12.1  HCT 36.2  PLT 335   BMET  Basename 04/29/12 0804  NA 137  K 3.9  CL 101  CO2 24  GLUCOSE 110*  BUN 21  CREATININE 0.78  CALCIUM 9.7    Studies/Results: No results found.  Assessment/Plan: Doing well. Needs to stay until tomorrow due to family issues. Will plan d/c in am.  LOS: 2 days  as above   Reinaldo Meeker, MD 05/01/2012, 8:23 AM

## 2012-05-01 NOTE — Progress Notes (Signed)
Pt called RN into room and said "my IV is bleeding." Rn looked at PICC site which looked bloody under the transparent gauze. IV team paged, and IV nurse said that small amt of bleeding can be normal with a new PICC and to keep fluids going at [facility], and to call back if anything changes. Will cont to monitor site.   Minor, Yvette Rack

## 2012-05-01 NOTE — Progress Notes (Signed)
When reassessing pt's PICC site for additional bleeding noted small amount of blood as well as some swelling. Asked IV team nurse to assess and she confirmed swelling. Paged Dr Lovell Sheehan who recommended continuing observation and to bandage site if needed. Fluids are still running at [facility], arm is elevated, pt has no complaints of pain, VSS. Will continue to monitor closely.

## 2012-05-02 ENCOUNTER — Encounter (HOSPITAL_COMMUNITY): Payer: Self-pay | Admitting: Neurosurgery

## 2012-05-02 DIAGNOSIS — M7989 Other specified soft tissue disorders: Secondary | ICD-10-CM

## 2012-05-02 NOTE — Progress Notes (Signed)
Peripherally Inserted Central Catheter/Midline Placement  The IV Nurse has discussed with the patient and/or persons authorized to consent for the patient, the purpose of this procedure and the potential benefits and risks involved with this procedure.  The benefits include less needle sticks, lab draws from the catheter and patient may be discharged home with the catheter.  Risks include, but not limited to, infection, bleeding, blood clot (thrombus formation), and puncture of an artery; nerve damage and irregular heat beat.  Alternatives to this procedure were also discussed.  PICC/Midline Placement Documentation  PICC / Midline Single Lumen 04/30/12 PICC Right Cephalic (Active)  Indication for Insertion or Continuance of Line Home intravenous therapies (PICC only) 05/02/2012  8:05 AM  Site Assessment Tender 05/02/2012  9:27 AM  Line Status Infusing;Flushed;Blood return noted 05/02/2012  9:27 AM  Dressing Type Transparent 05/02/2012  9:27 AM  Dressing Status Other (Comment) 05/02/2012  9:27 AM  Line Care Cap(s) changed;Tubing changed;Other (Comment) 05/01/2012 11:17 AM       Franne Grip Renee 05/02/2012, 5:42 PM

## 2012-05-02 NOTE — Progress Notes (Signed)
Patient ID: Lori Bean, female   DOB: 1971/02/10, 41 y.o.   MRN: [id number] Subjective: Patient reports feeling ok  Objective: Vital signs in last 24 hours: Temp:  [97.2 F (36.2 C)-98.1 F (36.7 C)] 98.1 F (36.7 C) (08/19 0600) Pulse Rate:  [68-95] 84  (08/19 0600) Resp:  [18-20] 18  (08/19 0600) BP: (115-137)/(65-83) 130/77 mmHg (08/19 0600) SpO2:  [96 %-100 %] 97 % (08/19 0600)  Intake/Output from previous day:   Intake/Output this shift:    Wound:looks good with minimal drainage  Lab Results: No results found for this basename: WBC:2,HGB:2,HCT:2,PLT:2 in the last 72 hours BMET No results found for this basename: NA:2,K:2,CL:2,CO2:2,GLUCOSE:2,BUN:2,CREATININE:2,CALCIUM:2 in the last 72 hours  Studies/Results: No results found.  Assessment/Plan: Doing better. Some swelling around the PICC line. Will need doppler study to assess, and will hold d/c until this is resolved.  LOS: 3 days  as above   Reinaldo Meeker, MD 05/02/2012, 8:57 AM

## 2012-05-02 NOTE — Progress Notes (Signed)
Spoke with Dr. Gerlene Fee about preliminary doppler results; new orders received. Notified Misty Stanley, RN from IV Team of situation and new orders; unable to replace PICC at this time but okay to use current PICC until new one placed.

## 2012-05-02 NOTE — Care Management Note (Signed)
    Page 1 of 1   05/04/2012     12:15:26 PM   CARE MANAGEMENT NOTE 05/04/2012  Patient:  Lori Bean, Lori Bean   Account Number:  [id number]  Date Initiated:  05/02/2012  Documentation initiated by:  Onnie Boer  Subjective/Objective Assessment:   PT WAS ADMITTED AN ABCESS FROM A PREVIOUS SURGERY     Action/Plan:   PROGRESSION OF CARE AND DISCHARGE PLANNING   Anticipated DC Date:  05/04/2012   Anticipated DC Plan:  HOME W HOME HEALTH SERVICES      DC Planning Services  CM consult      Choice offered to / List presented to:  C-1 Patient        HH arranged  HH-1 RN  IV Antibiotics      HH agency  [facility]   Status of service:  Completed, signed off Medicare Important Message given?   (If response is "NO", the following Medicare IM given date fields will be blank) Date Medicare IM given:   Date Additional Medicare IM given:    Discharge Disposition:  HOME W HOME HEALTH SERVICES  Per UR Regulation:  Reviewed for med. necessity/level of care/duration of stay  If discussed at Long Length of Stay Meetings, dates discussed:    Comments:  05/02/12 Onnie Boer, RN, BSN 1543 PT WAS ADMITTED WITH NEED FOR I& D OF AN ABCESS.  PT WILL NEED TO DC TO HOME WITH IV ABX.  PT HAS A PICC THAT HAS A THROMBUS BEHIND IT.  [facility] AWARE OF ABX NEED.  WILL F/U.

## 2012-05-02 NOTE — Progress Notes (Signed)
VASCULAR LAB PRELIMINARY  PRELIMINARY  PRELIMINARY  PRELIMINARY  Right upper extremity venous duplex completed.    Preliminary report:  Right:  Superficial thrombosis noted in the cephalic vein around the PICC line from the insertion site through the shoulder.  No evidence of DVT.    Lori Bean, 05/02/2012, 11:23 AM

## 2012-05-02 NOTE — Progress Notes (Signed)
IV Team Note;   Pt had a single lumen picc line placed on 04-30-12;  Pt reporting that "it hurts to lift my arm;"  Right arm more edematous than the left, especially around picc insertion site;  Iv fluids are infusing at [facility]; has good bld return, but pt says "it hurts;"   Doppler has been ordered;  drsg has bloody drainage noted; will hold drsg chg until after doppler study is back;   RN aware;   Barkley Bruns RN [facility], IV Team

## 2012-05-03 MED ORDER — CEFAZOLIN SODIUM-DEXTROSE 2-3 GM-% IV SOLR
2.0000 g | Freq: Three times a day (TID) | INTRAVENOUS | Status: DC
  Administered 2012-05-03: 2 g via INTRAVENOUS
  Filled 2012-05-03 (×3): qty 50

## 2012-05-03 MED ORDER — HEPARIN SOD (PORK) LOCK FLUSH 100 UNIT/ML IV SOLN
250.0000 [IU] | INTRAVENOUS | Status: AC | PRN
  Administered 2012-05-03: 250 [IU]

## 2012-05-03 NOTE — Progress Notes (Signed)
Upon d/c, the original AVS couldn't get printed because the order reconciliation couldn't complete this time because the doctor is in OR. Contacted the pharmacy, but they said they couldn't do it because it is a continuous IV home med. Got a copy of duplicate copy of AVS and gave to the patient. She received her antibiotics through [facility] to take home. Home health is set up, gave all instructions to take care of PICC line. No other concerns.

## 2012-05-04 LAB — ANAEROBIC CULTURE

## 2012-05-08 ENCOUNTER — Observation Stay (HOSPITAL_COMMUNITY)
Admission: EM | Admit: 2012-05-08 | Discharge: 2012-05-10 | Disposition: A | Discharge status: Home / Self Care | Payer: Medicaid Other | Attending: Internal Medicine | Admitting: Internal Medicine

## 2012-05-08 ENCOUNTER — Emergency Department (HOSPITAL_COMMUNITY): Payer: Medicaid Other

## 2012-05-08 ENCOUNTER — Encounter (HOSPITAL_COMMUNITY): Payer: Self-pay | Admitting: Emergency Medicine

## 2012-05-08 DIAGNOSIS — N3281 Overactive bladder: Secondary | ICD-10-CM

## 2012-05-08 DIAGNOSIS — F209 Schizophrenia, unspecified: Secondary | ICD-10-CM | POA: Insufficient documentation

## 2012-05-08 DIAGNOSIS — F319 Bipolar disorder, unspecified: Secondary | ICD-10-CM | POA: Insufficient documentation

## 2012-05-08 DIAGNOSIS — I82619 Acute embolism and thrombosis of superficial veins of unspecified upper extremity: Secondary | ICD-10-CM | POA: Insufficient documentation

## 2012-05-08 DIAGNOSIS — I8290 Acute embolism and thrombosis of unspecified vein: Secondary | ICD-10-CM

## 2012-05-08 DIAGNOSIS — M79609 Pain in unspecified limb: Secondary | ICD-10-CM | POA: Insufficient documentation

## 2012-05-08 DIAGNOSIS — Y838 Other surgical procedures as the cause of abnormal reaction of the patient, or of later complication, without mention of misadventure at the time of the procedure: Secondary | ICD-10-CM | POA: Insufficient documentation

## 2012-05-08 DIAGNOSIS — I10 Essential (primary) hypertension: Secondary | ICD-10-CM | POA: Insufficient documentation

## 2012-05-08 DIAGNOSIS — R079 Chest pain, unspecified: Secondary | ICD-10-CM | POA: Insufficient documentation

## 2012-05-08 DIAGNOSIS — Y849 Medical procedure, unspecified as the cause of abnormal reaction of the patient, or of later complication, without mention of misadventure at the time of the procedure: Secondary | ICD-10-CM | POA: Insufficient documentation

## 2012-05-08 DIAGNOSIS — I509 Heart failure, unspecified: Secondary | ICD-10-CM

## 2012-05-08 DIAGNOSIS — M1990 Unspecified osteoarthritis, unspecified site: Secondary | ICD-10-CM

## 2012-05-08 DIAGNOSIS — T8140XA Infection following a procedure, unspecified, initial encounter: Secondary | ICD-10-CM | POA: Insufficient documentation

## 2012-05-08 DIAGNOSIS — J45909 Unspecified asthma, uncomplicated: Secondary | ICD-10-CM | POA: Insufficient documentation

## 2012-05-08 DIAGNOSIS — I82629 Acute embolism and thrombosis of deep veins of unspecified upper extremity: Secondary | ICD-10-CM

## 2012-05-08 DIAGNOSIS — R0602 Shortness of breath: Secondary | ICD-10-CM | POA: Insufficient documentation

## 2012-05-08 DIAGNOSIS — F419 Anxiety disorder, unspecified: Secondary | ICD-10-CM | POA: Diagnosis present

## 2012-05-08 DIAGNOSIS — T82898A Other specified complication of vascular prosthetic devices, implants and grafts, initial encounter: Principal | ICD-10-CM | POA: Insufficient documentation

## 2012-05-08 DIAGNOSIS — K219 Gastro-esophageal reflux disease without esophagitis: Secondary | ICD-10-CM | POA: Diagnosis present

## 2012-05-08 LAB — CBC WITH DIFFERENTIAL/PLATELET
Basophils Absolute: 0 10*3/uL (ref 0.0–0.1)
Basophils Relative: 0 % (ref 0–1)
Eosinophils Absolute: 0.6 10*3/uL (ref 0.0–0.7)
Eosinophils Relative: 5 % (ref 0–5)
HCT: 35.5 % — ABNORMAL LOW (ref 36.0–46.0)
Hemoglobin: 11.9 g/dL — ABNORMAL LOW (ref 12.0–15.0)
Lymphocytes Relative: 27 % (ref 12–46)
Lymphs Abs: 3.2 10*3/uL (ref 0.7–4.0)
MCH: 30.3 pg (ref 26.0–34.0)
MCHC: 33.5 g/dL (ref 30.0–36.0)
MCV: 90.3 fL (ref 78.0–100.0)
Monocytes Absolute: 0.9 10*3/uL (ref 0.1–1.0)
Monocytes Relative: 8 % (ref 3–12)
Neutro Abs: 7.1 10*3/uL (ref 1.7–7.7)
Neutrophils Relative %: 60 % (ref 43–77)
Platelets: 214 10*3/uL (ref 150–400)
RBC: 3.93 MIL/uL (ref 3.87–5.11)
RDW: 13.6 % (ref 11.5–15.5)
WBC: 11.8 10*3/uL — ABNORMAL HIGH (ref 4.0–10.5)

## 2012-05-08 LAB — BASIC METABOLIC PANEL
BUN: 14 mg/dL (ref 6–23)
CO2: 27 mEq/L (ref 19–32)
Calcium: 9.7 mg/dL (ref 8.4–10.5)
Chloride: 102 mEq/L (ref 96–112)
Creatinine, Ser: 0.75 mg/dL (ref 0.50–1.10)
GFR calc Af Amer: >90 mL/min (ref >90)
GFR calc non Af Amer: >90 mL/min (ref >90)
Glucose, Bld: 123 mg/dL — ABNORMAL HIGH (ref 70–99)
Potassium: 3.6 mEq/L (ref 3.5–5.1)
Sodium: 137 mEq/L (ref 135–145)

## 2012-05-08 LAB — PROTIME-INR
INR: 0.95 (ref 0.00–1.49)
Prothrombin Time: 12.9 seconds (ref 11.6–15.2)

## 2012-05-08 LAB — TROPONIN I: Troponin I: <0.3 ng/mL (ref <0.30)

## 2012-05-08 MED ORDER — IOHEXOL 350 MG/ML SOLN
100.0000 mL | Freq: Once | INTRAVENOUS | Status: AC | PRN
  Administered 2012-05-08: 100 mL via INTRAVENOUS

## 2012-05-08 MED ORDER — ONDANSETRON HCL 4 MG/2ML IJ SOLN
4.0000 mg | Freq: Once | INTRAMUSCULAR | Status: AC
  Administered 2012-05-08: 4 mg via INTRAVENOUS
  Filled 2012-05-08: qty 2

## 2012-05-08 MED ORDER — ENOXAPARIN SODIUM 100 MG/ML ~~LOC~~ SOLN
100.0000 mg | Freq: Once | SUBCUTANEOUS | Status: AC
  Administered 2012-05-09: 100 mg via SUBCUTANEOUS
  Filled 2012-05-08: qty 1

## 2012-05-08 MED ORDER — MORPHINE SULFATE 4 MG/ML IJ SOLN
4.0000 mg | Freq: Once | INTRAMUSCULAR | Status: AC
Start: 2012-05-08 — End: 2012-05-08
  Administered 2012-05-08: 4 mg via INTRAVENOUS
  Filled 2012-05-08: qty 1

## 2012-05-08 NOTE — ED Provider Notes (Signed)
History     CSN: [id number]  Arrival date & time 05/08/12  2018   First MD Initiated Contact with Patient 05/08/12 2101      Chief Complaint  Patient presents with  . Arm Pain    at PICC line site    (Consider location/radiation/quality/duration/timing/severity/associated sxs/prior treatment) HPI Comments: Patient complains of pain in her left arm at the site of her PICC line that radiates up into her axilla and chest. This pain has been going on for the past 2 days. She is getting Ancef through the PICC line for a wound infection to her lumbar laminectomy site. Dr. Gerlene Fee performed a laminectomy July 18 and then do the I&D on August 16. Patient originally had a PICC line in the right arm was found to have a superficial clot in the os which to her left arm. This line has been placed for the past 6 days. She also complains of some chest pain and shortness of breath. Denies any cough or fever. She denies any bleeding or drainage from her surgical wound. Denies any weakness, numbness, tingling.  The history is provided by the patient.    Past Medical History  Diagnosis Date  . Asthma   . Hypertension   . Anxiety   . Snoring disorder     Pt stated my boyfriend always wakes me up and tells me to breathe  . Heart murmur   . CHF (congestive heart failure)   . Shortness of breath   . Bronchitis     hx of  . Bipolar affective disorder     Schizophrenia  . Depression   . GERD (gastroesophageal reflux disease)   . Arthritis   . Anginal pain   . Overactive bladder   . Schizophrenia     Past Surgical History  Procedure Date  . Rotator cuff repair     Right shoulder  . Eye surgery     Metal plate in right eye  . Tubal ligation   . Gallstones reomved   . Lumbar laminectomy/decompression microdiscectomy 04/01/2012    Procedure: LUMBAR LAMINECTOMY/DECOMPRESSION MICRODISCECTOMY 2 LEVELS;  Surgeon: Reinaldo Meeker, MD;  Location: [facility];  Service: Neurosurgery;  Laterality:  Left;  Lumbar four-five, lumbar five sacral one microdiscectomy   . Back surgery   . Lumbar wound debridement 04/29/2012    Procedure: LUMBAR WOUND DEBRIDEMENT;  Surgeon: Reinaldo Meeker, MD;  Location: [facility];  Service: Neurosurgery;  Laterality: N/A;  lumbar wound debridement    History reviewed. No pertinent family history.  History  Substance Use Topics  . Smoking status: Current Everyday Smoker -- 0.2 packs/day for 9 years    Types: Cigarettes  . Smokeless tobacco: Never Used  . Alcohol Use: Yes     social    OB History    Grav Para Term Preterm Abortions TAB SAB Ect Mult Living                  Review of Systems  Constitutional: Negative for fever, activity change and appetite change.  HENT: Negative for congestion, sore throat and rhinorrhea.   Respiratory: Positive for chest tightness and shortness of breath.   Cardiovascular: Negative for chest pain.  Gastrointestinal: Negative for nausea, vomiting and abdominal pain.  Genitourinary: Negative for dysuria.  Musculoskeletal: Positive for myalgias and arthralgias. Negative for back pain.  Skin: Negative for rash.  Neurological: Negative for dizziness, weakness and headaches.    Allergies  Review of patient's allergies indicates  no known allergies.  Home Medications   Current Outpatient Rx  Name Route Sig Dispense Refill  . ALBUTEROL SULFATE HFA 108 (90 BASE) MCG/ACT IN AERS Inhalation Inhale 2 puffs into the lungs every 6 (six) hours as needed. For shortness of breath    . AMLODIPINE BESYLATE 10 MG PO TABS Oral Take 10 mg by mouth daily.    . ARIPIPRAZOLE 5 MG PO TABS Oral Take 5 mg by mouth daily.    [name] CARISOPRODOL 350 MG PO TABS Oral Take 350 mg by mouth 2 (two) times daily. For muscle spasms    . DEXLANSOPRAZOLE 60 MG PO CPDR Oral Take 60 mg by mouth daily.    [name] DIAZEPAM 5 MG PO TABS Oral Take 5 mg by mouth every 6 (six) hours as needed. For spasms    . DICLOFENAC SODIUM 75 MG PO TBEC Oral Take 75 mg by  mouth 2 (two) times daily.    [name] FAMOTIDINE 20 MG PO TABS Oral Take 20 mg by mouth at bedtime as needed. One at bedtime    . FUROSEMIDE 40 MG PO TABS Oral Take 40 mg by mouth daily.    [name] METHOCARBAMOL 500 MG PO TABS Oral Take 500 mg by mouth every 6 (six) hours as needed. spasms    . MOMETASONE FURO-FORMOTEROL FUM 200-5 MCG/ACT IN AERO Inhalation Inhale 2 puffs into the lungs 2 (two) times daily.     [name] OMEPRAZOLE 20 MG PO CPDR Oral Take 20 mg by mouth daily.    . OXYCODONE-ACETAMINOPHEN 10-325 MG PO TABS Oral Take 1 tablet by mouth 3 (three) times daily as needed. For pain    . SOLIFENACIN SUCCINATE 5 MG PO TABS Oral Take 5 mg by mouth daily.      BP 138/84  Pulse 87  Temp 98.6 F (37 C) (Oral)  Resp 23  SpO2 98%  LMP 05/06/2012  Physical Exam  Constitutional: She is oriented to person, place, and time. She appears well-developed and well-nourished. No distress.  HENT:  Head: Normocephalic and atraumatic.  Mouth/Throat: Oropharynx is clear and moist. No oropharyngeal exudate.  Eyes: Conjunctivae are normal. Pupils are equal, round, and reactive to light.  Neck: Normal range of motion. Neck supple.  Cardiovascular: Normal rate, regular rhythm and normal heart sounds.   No murmur heard. Pulmonary/Chest: Effort normal and breath sounds normal. No respiratory distress. She exhibits tenderness.       Reproducible left-sided chest tenderness  Abdominal: Soft. There is no tenderness. There is no rebound and no guarding.  Musculoskeletal: Normal range of motion. She exhibits edema and tenderness.       LUE diffusely swollen and tender.  +2 Radial pulse, cardinal hand movements intact. PICC line in place without bleeding, drainage or cellulitis. LUE warmer than RUE  Lumbar midline incision with sutures in place, no cellulitis, no drainage or bleeding  Neurological: She is alert and oriented to person, place, and time. No cranial nerve deficit.  Skin: Skin is warm.    ED Course    Procedures (including critical care time)  Labs Reviewed  CBC WITH DIFFERENTIAL - Abnormal; Notable for the following:    WBC 11.8 (*)     Hemoglobin 11.9 (*)     HCT 35.5 (*)     All other components within normal limits  BASIC METABOLIC PANEL - Abnormal; Notable for the following:    Glucose, Bld 123 (*)     All other components within normal limits  PROTIME-INR  TROPONIN  I   Dg Chest 2 View  05/08/2012  *RADIOLOGY REPORT*  Clinical Data: Chest pain.  CHEST - 2 VIEW  Comparison: Plain films of the chest 03/23/2012.  Findings: The patient has a new left PICC projects over the mid superior vena cava.  There is cardiomegaly without edema.  No pneumothorax or effusion.  IMPRESSION:  1.  Cardiomegaly without edema. 2.  Tip of left PICC projects in the mid superior vena cava.   Original Report Authenticated By: Bernadene Bell. Maricela Curet, M.D.    Ct Angio Chest W/cm &/or Wo Cm  05/08/2012  *RADIOLOGY REPORT*  Clinical Data: Chest pain and shortness of breath.  CT ANGIOGRAPHY CHEST  Technique:  Multidetector CT imaging of the chest using the standard protocol during bolus administration of intravenous contrast. Multiplanar reconstructed images including MIPs were obtained and reviewed to evaluate the vascular anatomy.  Contrast: OMNIPAQUE IOHEXOL 350 MG/ML SOLN  Comparison: Plain films of the chest earlier this same date.  Findings: Study is somewhat limited due to suboptimal bolus timing. No pulmonary embolus is identified.  There is no axillary, hilar or mediastinal lymphadenopathy.  Cardiomegaly is noted.  Left PICC is in place.  No pleural or pericardial effusion.  Dependent atelectatic change is seen in the lungs bilaterally.  The lungs are otherwise clear.  Mild thickening of the left adrenal gland is unchanged.  Imaged intra-abdominal contents are otherwise unremarkable.  No focal bony abnormality is identified.  IMPRESSION: Negative for pulmonary embolus or acute finding.  Cardiomegaly.    Original Report Authenticated By: Bernadene Bell. Maricela Curet, M.D.      No diagnosis found.    MDM  Left upper extremity pain is a PICC line. Reproducible chest pain and subjective shortness of breath. Vital stable, no distress. Neurovascularly intact.  CT angiogram negative for pulmonary embolism. Unable to obtain Doppler overnight.  Results discussed with Dr. Wynetta Emery of neurosurgery. He approves use of anticoagulation for suspected DVT.  Lovenox given for possible DVT.  D/w Dr. Mikeal Hawthorne who believes full admission not warranted. "chest pain" is reproducible and likely referred from arm.  Will place in CDU for Doppler in morning given patient's complicating factor of PICC line.   Date: 05/09/2012  Rate: 91  Rhythm: normal sinus rhythm  QRS Axis: normal  Intervals: normal  ST/T Wave abnormalities: normal  Conduction Disutrbances:none  Narrative Interpretation:   Old EKG Reviewed: unchanged    Glynn Octave, MD 05/09/12 [phone]

## 2012-05-08 NOTE — ED Notes (Signed)
Pt states, "I had back surgery in July & had to go back to the hospital because I got an infection. I had a PICC line placed last Sat. & at first they put it in my R arm, but it rejected it. Later that day they put it in my L arm. I started having pain in my arm & I cannot stand it anymore."

## 2012-05-09 ENCOUNTER — Encounter (HOSPITAL_COMMUNITY): Payer: Self-pay | Admitting: General Practice

## 2012-05-09 DIAGNOSIS — N3281 Overactive bladder: Secondary | ICD-10-CM | POA: Diagnosis present

## 2012-05-09 DIAGNOSIS — I749 Embolism and thrombosis of unspecified artery: Secondary | ICD-10-CM

## 2012-05-09 DIAGNOSIS — M1990 Unspecified osteoarthritis, unspecified site: Secondary | ICD-10-CM | POA: Diagnosis present

## 2012-05-09 DIAGNOSIS — M7989 Other specified soft tissue disorders: Secondary | ICD-10-CM

## 2012-05-09 DIAGNOSIS — F209 Schizophrenia, unspecified: Secondary | ICD-10-CM | POA: Diagnosis present

## 2012-05-09 DIAGNOSIS — I10 Essential (primary) hypertension: Secondary | ICD-10-CM | POA: Diagnosis present

## 2012-05-09 DIAGNOSIS — K219 Gastro-esophageal reflux disease without esophagitis: Secondary | ICD-10-CM | POA: Diagnosis present

## 2012-05-09 DIAGNOSIS — J45909 Unspecified asthma, uncomplicated: Secondary | ICD-10-CM

## 2012-05-09 DIAGNOSIS — M79609 Pain in unspecified limb: Secondary | ICD-10-CM

## 2012-05-09 DIAGNOSIS — T8140XA Infection following a procedure, unspecified, initial encounter: Secondary | ICD-10-CM

## 2012-05-09 DIAGNOSIS — F319 Bipolar disorder, unspecified: Secondary | ICD-10-CM | POA: Diagnosis present

## 2012-05-09 DIAGNOSIS — F419 Anxiety disorder, unspecified: Secondary | ICD-10-CM | POA: Diagnosis present

## 2012-05-09 LAB — CBC
HCT: 33.1 % — ABNORMAL LOW (ref 36.0–46.0)
HCT: 34.5 % — ABNORMAL LOW (ref 36.0–46.0)
Hemoglobin: 10.7 g/dL — ABNORMAL LOW (ref 12.0–15.0)
Hemoglobin: 11.9 g/dL — ABNORMAL LOW (ref 12.0–15.0)
MCH: 29.3 pg (ref 26.0–34.0)
MCH: 30.6 pg (ref 26.0–34.0)
MCHC: 32.3 g/dL (ref 30.0–36.0)
MCHC: 34.5 g/dL (ref 30.0–36.0)
MCV: 90.7 fL (ref 78.0–100.0)
MCV: 88.7 fL (ref 78.0–100.0)
Platelets: 212 10*3/uL (ref 150–400)
Platelets: 164 10*3/uL (ref 150–400)
RBC: 3.65 MIL/uL — ABNORMAL LOW (ref 3.87–5.11)
RBC: 3.89 MIL/uL (ref 3.87–5.11)
RDW: 13.8 % (ref 11.5–15.5)
RDW: 13.7 % (ref 11.5–15.5)
WBC: 10.3 10*3/uL (ref 4.0–10.5)
WBC: 9.9 10*3/uL (ref 4.0–10.5)

## 2012-05-09 LAB — BASIC METABOLIC PANEL
BUN: 8 mg/dL (ref 6–23)
CO2: 22 mEq/L (ref 19–32)
Calcium: 8.6 mg/dL (ref 8.4–10.5)
Chloride: 108 mEq/L (ref 96–112)
Creatinine, Ser: 0.64 mg/dL (ref 0.50–1.10)
GFR calc Af Amer: >90 mL/min (ref >90)
GFR calc non Af Amer: >90 mL/min (ref >90)
Glucose, Bld: 98 mg/dL (ref 70–99)
Potassium: 3.9 mEq/L (ref 3.5–5.1)
Sodium: 140 mEq/L (ref 135–145)

## 2012-05-09 LAB — CREATININE, SERUM
Creatinine, Ser: 0.77 mg/dL (ref 0.50–1.10)
GFR calc Af Amer: >90 mL/min (ref >90)
GFR calc non Af Amer: >90 mL/min (ref >90)

## 2012-05-09 LAB — POCT I-STAT TROPONIN I: Troponin i, poc: 0.01 ng/mL (ref 0.00–0.08)

## 2012-05-09 MED ORDER — PANTOPRAZOLE SODIUM 40 MG PO TBEC
40.0000 mg | DELAYED_RELEASE_TABLET | Freq: Every day | ORAL | Status: DC
  Administered 2012-05-09 – 2012-05-10 (×2): 40 mg via ORAL
  Filled 2012-05-09 (×2): qty 1

## 2012-05-09 MED ORDER — FLUTICASONE-SALMETEROL 250-50 MCG/DOSE IN AEPB
1.0000 | INHALATION_SPRAY | Freq: Two times a day (BID) | RESPIRATORY_TRACT | Status: DC
  Administered 2012-05-09 – 2012-05-10 (×2): 1 via RESPIRATORY_TRACT
  Filled 2012-05-09 (×3): qty 14

## 2012-05-09 MED ORDER — FAMOTIDINE 20 MG PO TABS
20.0000 mg | ORAL_TABLET | Freq: Every evening | ORAL | Status: DC | PRN
  Filled 2012-05-09: qty 1

## 2012-05-09 MED ORDER — MORPHINE SULFATE 4 MG/ML IJ SOLN
4.0000 mg | Freq: Once | INTRAMUSCULAR | Status: AC
  Administered 2012-05-09: 4 mg via INTRAVENOUS
  Filled 2012-05-09: qty 1

## 2012-05-09 MED ORDER — ARIPIPRAZOLE 5 MG PO TABS
5.0000 mg | ORAL_TABLET | Freq: Every day | ORAL | Status: DC
  Administered 2012-05-09 – 2012-05-10 (×2): 5 mg via ORAL
  Filled 2012-05-09 (×2): qty 1

## 2012-05-09 MED ORDER — ENOXAPARIN SODIUM 40 MG/0.4ML ~~LOC~~ SOLN
40.0000 mg | SUBCUTANEOUS | Status: DC
  Administered 2012-05-09 – 2012-05-10 (×2): 40 mg via SUBCUTANEOUS
  Filled 2012-05-09 (×2): qty 0.4

## 2012-05-09 MED ORDER — DARIFENACIN HYDROBROMIDE ER 7.5 MG PO TB24
7.5000 mg | ORAL_TABLET | Freq: Every day | ORAL | Status: DC
  Administered 2012-05-09 – 2012-05-10 (×2): 7.5 mg via ORAL
  Filled 2012-05-09 (×2): qty 1

## 2012-05-09 MED ORDER — AMLODIPINE BESYLATE 10 MG PO TABS
10.0000 mg | ORAL_TABLET | Freq: Every day | ORAL | Status: DC
  Administered 2012-05-09 – 2012-05-10 (×2): 10 mg via ORAL
  Filled 2012-05-09 (×2): qty 1

## 2012-05-09 MED ORDER — CEFAZOLIN SODIUM 1-5 GM-% IV SOLN
1.0000 g | Freq: Three times a day (TID) | INTRAVENOUS | Status: DC
  Administered 2012-05-09 – 2012-05-10 (×6): 1 g via INTRAVENOUS
  Filled 2012-05-09 (×8): qty 50

## 2012-05-09 MED ORDER — ALBUTEROL SULFATE HFA 108 (90 BASE) MCG/ACT IN AERS: 2.0000 | INHALATION_SPRAY | Freq: Four times a day (QID) | RESPIRATORY_TRACT | Status: DC | PRN

## 2012-05-09 MED ORDER — ONDANSETRON HCL 4 MG/2ML IJ SOLN: 4.0000 mg | Freq: Three times a day (TID) | INTRAMUSCULAR | Status: AC | PRN

## 2012-05-09 MED ORDER — MOMETASONE FURO-FORMOTEROL FUM 200-5 MCG/ACT IN AERO
2.0000 | INHALATION_SPRAY | Freq: Two times a day (BID) | RESPIRATORY_TRACT | Status: DC
  Filled 2012-05-09 (×18): qty 0.3

## 2012-05-09 MED ORDER — ENOXAPARIN SODIUM 100 MG/ML ~~LOC~~ SOLN
100.0000 mg | Freq: Two times a day (BID) | SUBCUTANEOUS | Status: DC
  Filled 2012-05-09 (×2): qty 1

## 2012-05-09 MED ORDER — DICLOFENAC SODIUM 75 MG PO TBEC
75.0000 mg | DELAYED_RELEASE_TABLET | Freq: Two times a day (BID) | ORAL | Status: DC
  Administered 2012-05-09 – 2012-05-10 (×4): 75 mg via ORAL
  Filled 2012-05-09 (×7): qty 1

## 2012-05-09 MED ORDER — CARISOPRODOL 350 MG PO TABS
350.0000 mg | ORAL_TABLET | Freq: Two times a day (BID) | ORAL | Status: DC
  Administered 2012-05-09 – 2012-05-10 (×4): 350 mg via ORAL
  Filled 2012-05-09 (×5): qty 1

## 2012-05-09 MED ORDER — PANTOPRAZOLE SODIUM 40 MG PO TBEC: 40.0000 mg | DELAYED_RELEASE_TABLET | Freq: Every day | ORAL | Status: DC

## 2012-05-09 MED ORDER — ONDANSETRON HCL 4 MG/2ML IJ SOLN
4.0000 mg | Freq: Four times a day (QID) | INTRAMUSCULAR | Status: DC | PRN
  Administered 2012-05-09 (×2): 4 mg via INTRAVENOUS
  Filled 2012-05-09 (×3): qty 2

## 2012-05-09 MED ORDER — METOPROLOL TARTRATE 25 MG PO TABS: 100.0000 mg | ORAL_TABLET | Freq: Once | ORAL | Status: DC

## 2012-05-09 MED ORDER — FUROSEMIDE 40 MG PO TABS
40.0000 mg | ORAL_TABLET | Freq: Every day | ORAL | Status: DC
  Administered 2012-05-09 – 2012-05-10 (×2): 40 mg via ORAL
  Filled 2012-05-09: qty 1
  Filled 2012-05-09: qty 2

## 2012-05-09 MED ORDER — MORPHINE SULFATE 4 MG/ML IJ SOLN
4.0000 mg | INTRAMUSCULAR | Status: DC | PRN
  Administered 2012-05-09 – 2012-05-10 (×6): 4 mg via INTRAVENOUS
  Filled 2012-05-09 (×6): qty 1

## 2012-05-09 NOTE — ED Provider Notes (Signed)
8:05 AM Patient is in CDU under observation, DVT protocol.  This is a shared visit with Dr Manus Gunning.  Pt has a PICC line in her left upper arm and has hx clot in right arm.  PICC line has been placed to treat infected surgical incision in lumbar spine.  Pt received Lovenox last night.  Has chest pain but this is reproducible and CT angio is negative.  Pt reports that overnight she woke up and vomited several times, otherwise no acute events overnight.  Pt is A&O, NAD, RRR, CTAB, left arm with PICC in place, arms are very large bilaterally, left arm tender to palpation, lumbar spine surgical incision is intact with sutures in place, mildly tender, no erythema, no active discharge.  Plan is for doppler US this morning.  Was to have appointment with Dr Gerlene Fee this morning at 11am for recheck of surgical incision infection.    8:51 AM Patient also seen by Dr Patria Mane.  Plan is for d/c home if pt is negative for DVT, admit if positive.    Ultrasound was positive for clot.  Sonographer states she believes the clot is in the basilic vein but is unsure given study.  Patient admitted to [facility].  Holding orders, bed request placed by me.    Results for orders placed during the hospital encounter of 05/08/12  CBC WITH DIFFERENTIAL      Component Value Range   WBC 11.8 (*) 4.0 - 10.5 K/uL   RBC 3.93  3.87 - 5.11 MIL/uL   Hemoglobin 11.9 (*) 12.0 - 15.0 g/dL   HCT 16.1 (*) 09.6 - 04.5 %   MCV 90.3  78.0 - 100.0 fL   MCH 30.3  26.0 - 34.0 pg   MCHC 33.5  30.0 - 36.0 g/dL   RDW 40.9  81.1 - 91.4 %   Platelets 214  150 - 400 K/uL   Neutrophils Relative 60  43 - 77 %   Neutro Abs 7.1  1.7 - 7.7 K/uL   Lymphocytes Relative 27  12 - 46 %   Lymphs Abs 3.2  0.7 - 4.0 K/uL   Monocytes Relative 8  3 - 12 %   Monocytes Absolute 0.9  0.1 - 1.0 K/uL   Eosinophils Relative 5  0 - 5 %   Eosinophils Absolute 0.6  0.0 - 0.7 K/uL   Basophils Relative 0  0 - 1 %   Basophils Absolute 0.0  0.0 - 0.1 K/uL  BASIC METABOLIC  PANEL      Component Value Range   Sodium 137  135 - 145 mEq/L   Potassium 3.6  3.5 - 5.1 mEq/L   Chloride 102  96 - 112 mEq/L   CO2 27  19 - 32 mEq/L   Glucose, Bld 123 (*) 70 - 99 mg/dL   BUN 14  6 - 23 mg/dL   Creatinine, Ser 7.82  0.50 - 1.10 mg/dL   Calcium 9.7  8.4 - 95.6 mg/dL   GFR calc non Af Amer >90  >90 mL/min   GFR calc Af Amer >90  >90 mL/min  PROTIME-INR      Component Value Range   Prothrombin Time 12.9  11.6 - 15.2 seconds   INR 0.95  0.00 - 1.49  TROPONIN I      Component Value Range   Troponin I <0.30  <0.30 ng/mL  BASIC METABOLIC PANEL      Component Value Range   Sodium 140  135 - 145 mEq/L  Potassium 3.9  3.5 - 5.1 mEq/L   Chloride 108  96 - 112 mEq/L   CO2 22  19 - 32 mEq/L   Glucose, Bld 98  70 - 99 mg/dL   BUN 8  6 - 23 mg/dL   Creatinine, Ser 1.61  0.50 - 1.10 mg/dL   Calcium 8.6  8.4 - 09.6 mg/dL   GFR calc non Af Amer >90  >90 mL/min   GFR calc Af Amer >90  >90 mL/min  CBC      Component Value Range   WBC 10.3  4.0 - 10.5 K/uL   RBC 3.65 (*) 3.87 - 5.11 MIL/uL   Hemoglobin 10.7 (*) 12.0 - 15.0 g/dL   HCT 04.5 (*) 40.9 - 81.1 %   MCV 90.7  78.0 - 100.0 fL   MCH 29.3  26.0 - 34.0 pg   MCHC 32.3  30.0 - 36.0 g/dL   RDW 91.4  78.2 - 95.6 %   Platelets 212  150 - 400 K/uL  POCT I-STAT TROPONIN I      Component Value Range   Troponin i, poc 0.01  0.00 - 0.08 ng/mL   Comment 3            Dg Chest 2 View  05/08/2012  *RADIOLOGY REPORT*  Clinical Data: Chest pain.  CHEST - 2 VIEW  Comparison: Plain films of the chest 03/23/2012.  Findings: The patient has a new left PICC projects over the mid superior vena cava.  There is cardiomegaly without edema.  No pneumothorax or effusion.  IMPRESSION:  1.  Cardiomegaly without edema. 2.  Tip of left PICC projects in the mid superior vena cava.   Original Report Authenticated By: Bernadene Bell. Maricela Curet, M.D.    Ct Angio Chest W/cm &/or Wo Cm  05/08/2012  *RADIOLOGY REPORT*  Clinical Data: Chest pain and  shortness of breath.  CT ANGIOGRAPHY CHEST  Technique:  Multidetector CT imaging of the chest using the standard protocol during bolus administration of intravenous contrast. Multiplanar reconstructed images including MIPs were obtained and reviewed to evaluate the vascular anatomy.  Contrast: OMNIPAQUE IOHEXOL 350 MG/ML SOLN  Comparison: Plain films of the chest earlier this same date.  Findings: Study is somewhat limited due to suboptimal bolus timing. No pulmonary embolus is identified.  There is no axillary, hilar or mediastinal lymphadenopathy.  Cardiomegaly is noted.  Left PICC is in place.  No pleural or pericardial effusion.  Dependent atelectatic change is seen in the lungs bilaterally.  The lungs are otherwise clear.  Mild thickening of the left adrenal gland is unchanged.  Imaged intra-abdominal contents are otherwise unremarkable.  No focal bony abnormality is identified.  IMPRESSION: Negative for pulmonary embolus or acute finding.  Cardiomegaly.   Original Report Authenticated By: Bernadene Bell. Maricela Curet, M.D.        [place], [place] 05/09/12 1208

## 2012-05-09 NOTE — ED Provider Notes (Signed)
Medical screening examination/treatment/procedure(s) were conducted as a shared visit with non-physician practitioner(s) and myself.  I personally evaluated the patient during the encounter  Glynn Octave, MD 05/09/12 1256

## 2012-05-09 NOTE — ED Notes (Signed)
Patient is resting comfortably. 

## 2012-05-09 NOTE — ED Notes (Signed)
Spoke with doppler lab, pt unable to get out of bed at this time, they are going to do procedure at bedside per Marcelino Duster

## 2012-05-09 NOTE — ED Notes (Signed)
Ordered regular meal tray for patient

## 2012-05-09 NOTE — ED Notes (Signed)
Dr. Patria Mane notified of phlebotomist attempts to obtain sample.

## 2012-05-09 NOTE — Care Management Note (Unsigned)
    Page 1 of 1   05/09/2012     3:38:17 PM   CARE MANAGEMENT NOTE 05/09/2012  Patient:  SAKIYAH, SHUR   Account Number:  [id number]  Date Initiated:  05/09/2012  Documentation initiated by:  SIMMONS,Devani Odonnel  Subjective/Objective Assessment:   ADMITTED WITH LEFT ARM PAIN; LIVES AT HOME WITH FAMILY; ALREADY ACTIVE WITH [facility] FOR HHRN, IV ABX; WAS IPTA.     Action/Plan:   DISCHARGE PLANNING DISCUSSED AT BEDSIDE.   Anticipated DC Date:  05/11/2012   Anticipated DC Plan:  HOME W HOME HEALTH SERVICES      DC Planning Services  CM consult      [facility] Choice  Resumption Of Svcs/PTA Provider   Choice offered to / List presented to:             [facility] agency  [facility]   Status of service:  In process, will continue to follow Medicare Important Message given?   (If response is "NO", the following Medicare IM given date fields will be blank) Date Medicare IM given:   Date Additional Medicare IM given:    Discharge Disposition:    Per UR Regulation:  Reviewed for med. necessity/level of care/duration of stay  If discussed at Long Length of Stay Meetings, dates discussed:    Comments:  05/09/12  1537  [place] Brigance SIMMONS RN, BSN [phone] NCM WILL FOLLOW.

## 2012-05-09 NOTE — Progress Notes (Signed)
Left:  SVT noted around the PICC in the basilic vein.

## 2012-05-09 NOTE — Progress Notes (Addendum)
Disposition Note  Lori Bean, is a 41 y.o. female,   MRN: [id number]  -  DOB - 20-Jun-1971  Outpatient Primary MD for the patient is AYODELE, SAMMY, RN   Blood pressure 156/87, pulse 83, temperature 98.7 F (37.1 C), temperature source Oral, resp. rate 14, last menstrual period 05/06/2012, SpO2 99.00%.  Active Problems:  Asthma  Post-operative infection  CHF (congestive heart failure)  Anxiety  Hypertension  Bipolar affective disorder  GERD (gastroesophageal reflux disease)  Arthritis  Schizophrenia  Overactive bladder  Thrombus    41 yo female with complex PMH.  Had recent lumbar laminectomy in July of 2013.  Patient on Ancef for Post Op infection.  Originally PICC line was in Right arm but patient developed a superficial thrombus and PICC was moved to left arm.  Now patient presents to ED with LEFT arm pain that has radiated to her left chest.  Thrombus found on doppler ultrasound in left arm near PICC site.    Patient received lovenox last night.  Will request a tele bed (patients co-morbidities are stable per ED), request lovenox per pharmacy, and request a Neuro surgical consultation to evaluate the post op infection site.  Patient actually had an appointment scheduled with Dr. Gerlene Fee in his office today.   Algis Downs, PA-C [facility] Pager: [phone]   Addendum:  I received a call back from Dr. Gerlene Fee.  He recommends one more week of Ancef that could possibly be given via peripheral IV.  (His suggestion was to use home health and move the peripheral IV around a couple of times.)  He mentioned the wound was not a deep infection one more week of AB therapy should be enough.  He would like for her to follow up in the office after discharge.  He requests we call him back with questions if needed.    Algis Downs, PA-C [facility] Pager: [phone]

## 2012-05-09 NOTE — ED Notes (Signed)
Phlebotomy could not draw blood for i[stat, provider aware.

## 2012-05-09 NOTE — ED Notes (Signed)
Dr Patria Mane at bedside.  Phlebotomy just finished from a foot stick.

## 2012-05-09 NOTE — H&P (Addendum)
[facility] History and Physical  HALIEY ROMBERG QIO:[id number] DOB: 12-06-1970 DOA: 05/08/2012   PCP: Myrtie Soman, RN   Chief Complaint:   HPI: Lori Bean is a 41 y.o. female came in for worsening left upper extremity pain since 3 days. She was seen in ed and underwent a venous duplex, and was found to have a superficial thrombus in the basilic vein. PICC line was taken out from the arm and a peripheral line was put in for continuing antibiotics . Patient reports severe pain in the right arm and left arm, and swelling since 3 days. She denies any fevers or chills. Dr Gerlene Fee was called and recommendations are to continue with ancef for one mor week. She is being admitted to hsopitalist service for evaluation and management of the Superficial thrombus of the right upper extremity .   Review of Systems: The patient denies anorexia, fever, weight loss,, vision loss, decreased hearing, hoarseness, chest pain, syncope, dyspnea on exertion, peripheral edema, balance deficits, hemoptysis, abdominal pain, melena, hematochezia, severe indigestion/heartburn, hematuria, incontinence, genital sores, muscle weakness, suspicious skin lesions, transient blindness, difficulty walking, depression, unusual weight change, abnormal bleeding, angioedema, and breast masses.    Past Medical History  Diagnosis Date  . Asthma   . Hypertension   . Anxiety   . Snoring disorder     Pt stated my boyfriend always wakes me up and tells me to breathe  . Heart murmur   . CHF (congestive heart failure)   . Shortness of breath   . Bronchitis     hx of  . Bipolar affective disorder     Schizophrenia  . Depression   . GERD (gastroesophageal reflux disease)   . Arthritis   . Anginal pain   . Overactive bladder   . Schizophrenia    Past Surgical History  Procedure Date  . Rotator cuff repair     Right shoulder  . Eye surgery     Metal plate in right eye  . Tubal ligation   . Gallstones  reomved   . Lumbar laminectomy/decompression microdiscectomy 04/01/2012    Procedure: LUMBAR LAMINECTOMY/DECOMPRESSION MICRODISCECTOMY 2 LEVELS;  Surgeon: Reinaldo Meeker, MD;  Location: [facility];  Service: Neurosurgery;  Laterality: Left;  Lumbar four-five, lumbar five sacral one microdiscectomy   . Back surgery   . Lumbar wound debridement 04/29/2012    Procedure: LUMBAR WOUND DEBRIDEMENT;  Surgeon: Reinaldo Meeker, MD;  Location: [facility];  Service: Neurosurgery;  Laterality: N/A;  lumbar wound debridement   Social History:  reports that she has been smoking Cigarettes.  She has a 2.25 pack-year smoking history. She has never used smokeless tobacco. She reports that she drinks alcohol. She reports that she uses illicit drugs (Marijuana).  No Known Allergies    Prior to Admission medications   Medication Sig Start Date End Date Taking? Authorizing Provider  albuterol (PROVENTIL HFA;VENTOLIN HFA) 108 (90 BASE) MCG/ACT inhaler Inhale 2 puffs into the lungs every 6 (six) hours as needed. For shortness of breath   Yes Historical Provider, MD  amLODipine (NORVASC) 10 MG tablet Take 10 mg by mouth daily.   Yes Historical Provider, MD  ARIPiprazole (ABILIFY) 5 MG tablet Take 5 mg by mouth daily.   Yes Historical Provider, MD  carisoprodol (SOMA) 350 MG tablet Take 350 mg by mouth 2 (two) times daily. For muscle spasms   Yes Historical Provider, MD  dexlansoprazole (DEXILANT) 60 MG capsule Take 60 mg by mouth daily.  Yes Historical Provider, MD  diazepam (VALIUM) 5 MG tablet Take 5 mg by mouth every 6 (six) hours as needed. For spasms   Yes Historical Provider, MD  diclofenac (VOLTAREN) 75 MG EC tablet Take 75 mg by mouth 2 (two) times daily.   Yes Historical Provider, MD  famotidine (PEPCID) 20 MG tablet Take 20 mg by mouth at bedtime as needed. One at bedtime 03/29/12 03/29/13 Yes Nyoka Cowden, MD  furosemide (LASIX) 40 MG tablet Take 40 mg by mouth daily.   Yes Historical Provider, MD    methocarbamol (ROBAXIN) 500 MG tablet Take 500 mg by mouth every 6 (six) hours as needed. spasms   Yes Historical Provider, MD  Mometasone Furo-Formoterol Fum 200-5 MCG/ACT AERO Inhale 2 puffs into the lungs 2 (two) times daily.  03/29/12 03/29/13 Yes Nyoka Cowden, MD  omeprazole (PRILOSEC) 20 MG capsule Take 20 mg by mouth daily.   Yes Historical Provider, MD  oxyCODONE-acetaminophen (PERCOCET) 10-325 MG per tablet Take 1 tablet by mouth 3 (three) times daily as needed. For pain   Yes Historical Provider, MD  solifenacin (VESICARE) 5 MG tablet Take 5 mg by mouth daily.   Yes Historical Provider, MD   Physical Exam: Filed Vitals:   05/09/12 0922 05/09/12 1000 05/09/12 1153 05/09/12 1200  BP:  139/66 137/80 123/76  Pulse:  79 86 86  Temp:      TempSrc:      Resp:   16   Height: 5' 1.81" (1.57 m)     Weight: 100.2 kg (220 lb 14.4 oz)     SpO2:  96% 100% 99%    Constitutional: Vital signs reviewed.  Patient is a well-developed and well-nourished  in no acute distress and cooperative with exam. sleepy. Head: Normocephalic and atraumatic Mouth: no erythema or exudates, MMM Eyes: PERRL, EOMI, conjunctivae normal, No scleral icterus.  Neck: Supple, Trachea midline normal ROM, No JVD, mass, thyromegaly, or carotid bruit present.  Cardiovascular: RRR, S1 normal, S2 normal, no MRG, pulses symmetric and intact bilaterally Pulmonary/Chest: CTAB, no wheezes, rales, or rhonchi Abdominal: Soft. Non-tender, non-distended, bowel sounds are normal, no masses, organomegaly, or guarding present.  Musculoskeletal: right UPPER EXTREMITY SWOLLEN, and tenderf rom the superficial thrombus in the basilic vein.  Neurological: A&O x3, Strength is normal and symmetric bilaterally, cranial nerve II-XII are grossly intact, no focal motor deficit, sensory intact to light touch bilaterally.  Skin: Warm, dry and intact. No rash, cyanosis, or clubbing.  Psychiatric: Normal mood and affect. speech and behavior is  normal. Judgment and thought content normal. Cognition and memory are normal.    Labs on Admission:  Basic Metabolic Panel:  Lab 05/09/12 1610 05/09/12 1000 05/08/12 2131  NA -- 140 137  K -- 3.9 3.6  CL -- 108 102  CO2 -- 22 27  GLUCOSE -- 98 123*  BUN -- 8 14  CREATININE 0.77 0.64 0.75  CALCIUM -- 8.6 9.7  MG -- -- --  PHOS -- -- --   Liver Function Tests: No results found for this basename: AST:5,ALT:5,ALKPHOS:5,BILITOT:5,PROT:5,ALBUMIN:5 in the last 168 hours No results found for this basename: LIPASE:5,AMYLASE:5 in the last 168 hours No results found for this basename: AMMONIA:5 in the last 168 hours CBC:  Lab 05/09/12 1700 05/09/12 1000 05/08/12 2131  WBC 9.9 10.3 11.8*  NEUTROABS -- -- 7.1  HGB 11.9* 10.7* 11.9*  HCT 34.5* 33.1* 35.5*  MCV 88.7 90.7 90.3  PLT 164 212 214   Cardiac Enzymes:  Lab  05/08/12 2131  CKTOTAL --  CKMB --  CKMBINDEX --  TROPONINI <0.30    BNP (last 3 results) No results found for this basename: PROBNP:3 in the last 8760 hours CBG: No results found for this basename: GLUCAP:5 in the last 168 hours  Radiological Exams on Admission: Dg Chest 2 View  05/08/2012  *RADIOLOGY REPORT*  Clinical Data: Chest pain.  CHEST - 2 VIEW  Comparison: Plain films of the chest 03/23/2012.  Findings: The patient has a new left PICC projects over the mid superior vena cava.  There is cardiomegaly without edema.  No pneumothorax or effusion.  IMPRESSION:  1.  Cardiomegaly without edema. 2.  Tip of left PICC projects in the mid superior vena cava.   Original Report Authenticated By: Bernadene Bell. Maricela Curet, M.D.    Ct Angio Chest W/cm &/or Wo Cm  05/08/2012  *RADIOLOGY REPORT*  Clinical Data: Chest pain and shortness of breath.  CT ANGIOGRAPHY CHEST  Technique:  Multidetector CT imaging of the chest using the standard protocol during bolus administration of intravenous contrast. Multiplanar reconstructed images including MIPs were obtained and reviewed to  evaluate the vascular anatomy.  Contrast: OMNIPAQUE IOHEXOL 350 MG/ML SOLN  Comparison: Plain films of the chest earlier this same date.  Findings: Study is somewhat limited due to suboptimal bolus timing. No pulmonary embolus is identified.  There is no axillary, hilar or mediastinal lymphadenopathy.  Cardiomegaly is noted.  Left PICC is in place.  No pleural or pericardial effusion.  Dependent atelectatic change is seen in the lungs bilaterally.  The lungs are otherwise clear.  Mild thickening of the left adrenal gland is unchanged.  Imaged intra-abdominal contents are otherwise unremarkable.  No focal bony abnormality is identified.  IMPRESSION: Negative for pulmonary embolus or acute finding.  Cardiomegaly.   Original Report Authenticated By: Bernadene Bell. D'ALESSIO, M.D.     EKG: Sinus at 91/min  Assessment/Plan Active Problems:  Asthma  Post-operative infection  CHF (congestive heart failure)  Anxiety  Hypertension  Bipolar affective disorder  GERD (gastroesophageal reflux disease)  Arthritis  Schizophrenia  Overactive bladder  Thrombus   1. SVT in the left basilic vein from PICC : PICC to be removed. A peripheral line put in and started her on ancef. Since this is a superficial venous thrombus, we will continue with prophylactic doses of lovenox. A CT angiogram of teh chest showed no PE.   2. Schizophrenia: resume home medications.   3. Hypertension: controlled. Resume norvasc.   4. Lumbar diskectomy with post op infection: currently on ancef  For 1 more week. Will try to get case management to arrange IV antibiotics for one more week at home.  5. Asthma: appears to be stable. No wheezing on exam.  DVT prophylaxis: lovenox    Code Status: full code Family Communication: none at bedside Disposition Plan: possibly home with with home health.  [facility] Pager [phone]  If 7PM-7AM, please contact night-coverage [email or web address] Password  [facility] 05/09/2012, 6:46 PM

## 2012-05-09 NOTE — Progress Notes (Signed)
Initial review for inpatient status is complete. 

## 2012-05-09 NOTE — Consult Note (Signed)
ANTICOAGULATION CONSULT NOTE - Initial Consult  Pharmacy Consult for Lovenox Indication: SVT in left arm  No Known Allergies  Patient Measurements: Height: 5' 1.81" (157 cm) Weight: 220 lb 14.4 oz (100.2 kg) IBW/kg (Calculated) : 49.67   Vital Signs: Temp: 98.7 F (37.1 C) (08/26 0600) Temp src: Oral (08/26 0600) BP: 156/87 mmHg (08/26 0852) Pulse Rate: 83  (08/26 0852)  Labs:  Basename 05/09/12 1000 05/08/12 2131  HGB 10.7* 11.9*  HCT 33.1* 35.5*  PLT 212 214  APTT -- --  LABPROT -- 12.9  INR -- 0.95  HEPARINUNFRC -- --  CREATININE 0.64 0.75  CKTOTAL -- --  CKMB -- --  TROPONINI -- <0.30    Estimated Creatinine Clearance: 101.1 ml/min (by C-G formula based on Cr of 0.64).   Medical History: Past Medical History  Diagnosis Date  . Asthma   . Hypertension   . Anxiety   . Snoring disorder     Pt stated my boyfriend always wakes me up and tells me to breathe  . Heart murmur   . CHF (congestive heart failure)   . Shortness of breath   . Bronchitis     hx of  . Bipolar affective disorder     Schizophrenia  . Depression   . GERD (gastroesophageal reflux disease)   . Arthritis   . Anginal pain   . Overactive bladder   . Schizophrenia    Medications:  No anticoagulants pta  Assessment: 41yof who had a recent lumbar laminectomy 03/2012, placed on ancef post-op and discharged with a PICC line in her R arm. She then developed a SVT in the R arm and PICC moved to L arm. She is now found to have a SVT in the L arm around the PICC site. Pharmacy consulted for full dose lovenox. Patient received 100mg  lovenox at 0212 today. Renal function is stable. Baseline Hgb a little low, platelets stable.  Goal of Therapy:  Anti-Xa level 0.6-1.2 units/ml 4hrs after LMWH dose given Monitor platelets by anticoagulation protocol: Yes   Plan:  1) Lovenox 100mg  sq q12 - next dose due today at 1400 2) CBC q72h while on full dose lovenox  Fredrik Rigger 05/09/2012,10:44  AM

## 2012-05-10 DIAGNOSIS — F411 Generalized anxiety disorder: Secondary | ICD-10-CM

## 2012-05-10 DIAGNOSIS — I509 Heart failure, unspecified: Secondary | ICD-10-CM

## 2012-05-10 DIAGNOSIS — I10 Essential (primary) hypertension: Secondary | ICD-10-CM

## 2012-05-10 MED ORDER — CEPHALEXIN 500 MG PO CAPS: 500.0000 mg | ORAL_CAPSULE | Freq: Four times a day (QID) | ORAL | Status: AC

## 2012-05-10 MED ORDER — CEFAZOLIN SODIUM 1-5 GM-% IV SOLN: 1.0000 g | Freq: Three times a day (TID) | INTRAVENOUS | Status: DC

## 2012-05-10 MED ORDER — CEPHALEXIN 500 MG PO CAPS: 500.0000 mg | ORAL_CAPSULE | Freq: Four times a day (QID) | ORAL | Status: DC

## 2012-05-10 NOTE — Discharge Summary (Signed)
Physician Discharge Summary  NOHEA KRAS ZOX:[id number] DOB: June 05, 1971 DOA: 05/08/2012  PCP: Myrtie Soman, RN  Admit date: 05/08/2012 Discharge date: 05/10/2012  Recommendations for Outpatient Follow-up:  1. Follow up with Dr Gerlene Fee.s recommended.   Discharge Diagnoses:  Active Problems:  Asthma  Post-operative infection  CHF (congestive heart failure)  Anxiety  Hypertension  Bipolar affective disorder  GERD (gastroesophageal reflux disease)  Arthritis  Schizophrenia  Overactive bladder  Thrombus  Thrombosis   Discharge Condition: stable  Diet recommendation: low sodium diet  Filed Weights   05/09/12 0922  Weight: 100.2 kg (220 lb 14.4 oz)    History of present illness:  ERLINE SIDDOWAY is a 41 y.o. female came in for worsening left upper extremity pain since 3 days. She was seen in ed and underwent a venous duplex, and was found to have a superficial thrombus in the basilic vein. PICC line was taken out from the arm and a peripheral line was put in for continuing antibiotics . Patient reports severe pain in the right arm and left arm, and swelling since 3 days. She denies any fevers or chills. Dr Gerlene Fee was called and recommendations are to continue with ancef for one mor week. She is being admitted to hsopitalist service for evaluation and management of the Superficial thrombus of the right upper extremity .      Hospital Course:  1. SVT in the left basilic vein from PICC : PICC to be removed. A peripheral line put in and started her on ancef. Since this is a superficial venous thrombus, Lovenox was stopped. A CT angiogram of the chest showed no PE. Patient did not want to go home on iv antibiotics, spoke to Dr Waldron Labs and she is being discharged home with oral antibiotics to complete the course.  2. Schizophrenia: resume home medications.  3. Hypertension: controlled. Resume norvasc.  4. Lumbar diskectomy with post op infection: continue with oral  antibiotics.  5. Asthma: appears to be stable. No wheezing on exam.  DVT prophylaxis: lovenox   Procedures:  Venous dopplers of the upper extremity  Consultations:  Spoke to Dr Gerlene Fee over the phone  Discharge Exam: Filed Vitals:   05/10/12 1041  BP: 125/63  Pulse: 93  Temp:   Resp:    Filed Vitals:   05/10/12 0531 05/10/12 0800 05/10/12 0954 05/10/12 1041  BP: 123/64  156/107 125/63  Pulse: 83  106 93  Temp: 97.8 F (36.6 C)  97.8 F (36.6 C)   TempSrc: Oral  Oral   Resp: 16  18   Height:      Weight:      SpO2: 99% 95% 98%     General: alert afebrile comfortable Cardiovascular: s1s2 Respiratory: ctab Abdomen: soft NT ND BS+ Extremities: left lower extremity swollen and tender.   Discharge Instructions  Discharge Orders    Future Appointments: Provider: Department: Dept Phone: Center:   05/24/2012 1:30 PM [facility] [phone] [facility]     Future Orders Please Complete By Expires   Diet - low sodium heart healthy      Discharge instructions      Comments:   FOLLOW UP WITH DR Gerlene Fee AS RECOMMENDED.   Activity as tolerated - No restrictions        Medication List  As of 05/10/2012 12:51 PM   TAKE these medications         albuterol 108 (90 BASE) MCG/ACT inhaler   Commonly known as: PROVENTIL HFA;VENTOLIN HFA  Inhale 2 puffs into the lungs every 6 (six) hours as needed. For shortness of breath      amLODipine 10 MG tablet   Commonly known as: NORVASC   Take 10 mg by mouth daily.      ARIPiprazole 5 MG tablet   Commonly known as: ABILIFY   Take 5 mg by mouth daily.      carisoprodol 350 MG tablet   Commonly known as: SOMA   Take 350 mg by mouth 2 (two) times daily. For muscle spasms      ceFAZolin 1-5 GM-%   Commonly known as: ANCEF   Inject 50 mLs (1 g total) into the vein every 8 (eight) hours.      dexlansoprazole 60 MG capsule   Commonly known as: DEXILANT   Take 60 mg by mouth daily.      diazepam 5 MG  tablet   Commonly known as: VALIUM   Take 5 mg by mouth every 6 (six) hours as needed. For spasms      diclofenac 75 MG EC tablet   Commonly known as: VOLTAREN   Take 75 mg by mouth 2 (two) times daily.      famotidine 20 MG tablet   Commonly known as: PEPCID   Take 20 mg by mouth at bedtime as needed. One at bedtime      furosemide 40 MG tablet   Commonly known as: LASIX   Take 40 mg by mouth daily.      methocarbamol 500 MG tablet   Commonly known as: ROBAXIN   Take 500 mg by mouth every 6 (six) hours as needed. spasms      Mometasone Furo-Formoterol Fum 200-5 MCG/ACT Aero   Inhale 2 puffs into the lungs 2 (two) times daily.      omeprazole 20 MG capsule   Commonly known as: PRILOSEC   Take 20 mg by mouth daily.      oxyCODONE-acetaminophen 10-325 MG per tablet   Commonly known as: PERCOCET   Take 1 tablet by mouth 3 (three) times daily as needed. For pain      solifenacin 5 MG tablet   Commonly known as: VESICARE   Take 5 mg by mouth daily.              The results of significant diagnostics from this hospitalization (including imaging, microbiology, ancillary and laboratory) are listed below for reference.    Significant Diagnostic Studies: Dg Chest 2 View  05/08/2012  *RADIOLOGY REPORT*  Clinical Data: Chest pain.  CHEST - 2 VIEW  Comparison: Plain films of the chest 03/23/2012.  Findings: The patient has a new left PICC projects over the mid superior vena cava.  There is cardiomegaly without edema.  No pneumothorax or effusion.  IMPRESSION:  1.  Cardiomegaly without edema. 2.  Tip of left PICC projects in the mid superior vena cava.   Original Report Authenticated By: Bernadene Bell. Maricela Curet, M.D.    Ct Angio Chest W/cm &/or Wo Cm  05/08/2012  *RADIOLOGY REPORT*  Clinical Data: Chest pain and shortness of breath.  CT ANGIOGRAPHY CHEST  Technique:  Multidetector CT imaging of the chest using the standard protocol during bolus administration of intravenous contrast.  Multiplanar reconstructed images including MIPs were obtained and reviewed to evaluate the vascular anatomy.  Contrast: OMNIPAQUE IOHEXOL 350 MG/ML SOLN  Comparison: Plain films of the chest earlier this same date.  Findings: Study is somewhat limited due to suboptimal bolus timing. No pulmonary embolus  is identified.  There is no axillary, hilar or mediastinal lymphadenopathy.  Cardiomegaly is noted.  Left PICC is in place.  No pleural or pericardial effusion.  Dependent atelectatic change is seen in the lungs bilaterally.  The lungs are otherwise clear.  Mild thickening of the left adrenal gland is unchanged.  Imaged intra-abdominal contents are otherwise unremarkable.  No focal bony abnormality is identified.  IMPRESSION: Negative for pulmonary embolus or acute finding.  Cardiomegaly.   Original Report Authenticated By: Bernadene Bell. Maricela Curet, M.D.     Microbiology: No results found for this or any previous visit (from the past 240 hour(s)).   Labs: Basic Metabolic Panel:  Lab 05/09/12 0981 05/09/12 1000 05/08/12 2131  NA -- 140 137  K -- 3.9 3.6  CL -- 108 102  CO2 -- 22 27  GLUCOSE -- 98 123*  BUN -- 8 14  CREATININE 0.77 0.64 0.75  CALCIUM -- 8.6 9.7  MG -- -- --  PHOS -- -- --   Liver Function Tests: No results found for this basename: AST:5,ALT:5,ALKPHOS:5,BILITOT:5,PROT:5,ALBUMIN:5 in the last 168 hours No results found for this basename: LIPASE:5,AMYLASE:5 in the last 168 hours No results found for this basename: AMMONIA:5 in the last 168 hours CBC:  Lab 05/09/12 1700 05/09/12 1000 05/08/12 2131  WBC 9.9 10.3 11.8*  NEUTROABS -- -- 7.1  HGB 11.9* 10.7* 11.9*  HCT 34.5* 33.1* 35.5*  MCV 88.7 90.7 90.3  PLT 164 212 214   Cardiac Enzymes:  Lab 05/08/12 2131  CKTOTAL --  CKMB --  CKMBINDEX --  TROPONINI <0.30   BNP: BNP (last 3 results) No results found for this basename: PROBNP:3 in the last 8760 hours CBG: No results found for this basename: GLUCAP:5 in the  last 168 hours  Time coordinating discharge: 45 minutes  Signed:  Persephonie Hegwood  [facility] 05/10/2012, 12:51 PM

## 2012-05-10 NOTE — Progress Notes (Signed)
Discharge instructions and prescription reviewed with and given to patient, who verbalized understanding.  Patient awaiting sister for ride home.  Lori Bean

## 2012-05-20 NOTE — Discharge Summary (Signed)
Physician Discharge Summary  Patient ID: Lori Bean MRN: [id number] DOB/AGE: [birth year] 41 y.o.  Admit date: 04/29/2012 Discharge date: 05/20/2012  Admission Diagnoses:  Discharge Diagnoses:  Active Problems:  * No active hospital problems. *    Discharged Condition: good  Hospital Course: patient had surgery but her wound would not heal. She was admitted and taken to the OR where her wound was washed out and reclosed. She did well post op. A picc line was placed with plans for IV antibiotics for 2 weeks. Her condition was improved vs admission.  Consults: None  Significant Diagnostic Studies: none  Treatments: surgery: I and D of lumbar wound  Discharge Exam: Blood pressure 154/122, pulse 112, temperature 98.5 F (36.9 C), temperature source Oral, resp. rate 18, height 5\' 2"  (1.575 m), weight 100.2 kg (220 lb 14.4 oz), last menstrual period 03/11/2012, SpO2 99.00%. Incision/Wound:healing well  Disposition: 01-Home or Self Care  Discharge Orders    Future Appointments: Provider: Department: Dept Phone: Center:   05/24/2012 1:30 PM [facility] [phone] [facility]     Future Orders Please Complete By Expires   Diet general      Discharge instructions      Comments:   Mostly bedrest. Get up 9 or 10 times each day and walk for 15-20 minutes each time. Very little sitting the first week. No riding in the car until your first post op appointment. If you had neck surgery...may shower from the chest down. If you had low back surgery....you may shower with a saran wrap covering over the incision. Take your pain medicine as needed...and other medicines that you are instructed to take. Call for an appointment...[phone].   Call MD for:  temperature >100.4      Call MD for:  persistant nausea and vomiting      Call MD for:  severe uncontrolled pain      Call MD for:  redness, tenderness, or signs of infection (pain, swelling, redness, odor or  green/yellow discharge around incision site)      Call MD for:  difficulty breathing, headache or visual disturbances      Call MD for:  hives        Medication List  As of 05/20/2012  9:19 AM   STOP taking these medications         cephALEXin 500 MG capsule      naproxen 500 MG tablet      traMADol 50 MG tablet         TAKE these medications         albuterol 108 (90 BASE) MCG/ACT inhaler   Commonly known as: PROVENTIL HFA;VENTOLIN HFA   Inhale 2 puffs into the lungs every 6 (six) hours as needed. For shortness of breath      amLODipine 10 MG tablet   Commonly known as: NORVASC   Take 10 mg by mouth daily.      ARIPiprazole 5 MG tablet   Commonly known as: ABILIFY   Take 5 mg by mouth daily.      carisoprodol 350 MG tablet   Commonly known as: SOMA   Take 350 mg by mouth 2 (two) times daily. For muscle spasms      dexlansoprazole 60 MG capsule   Commonly known as: DEXILANT   Take 60 mg by mouth daily.      diazepam 5 MG tablet   Commonly known as: VALIUM   Take 5 mg by mouth every 6 (six)  hours as needed. For spasms      diclofenac 75 MG EC tablet   Commonly known as: VOLTAREN   Take 75 mg by mouth 2 (two) times daily.      famotidine 20 MG tablet   Commonly known as: PEPCID   Take 20 mg by mouth at bedtime as needed. One at bedtime      furosemide 40 MG tablet   Commonly known as: LASIX   Take 40 mg by mouth daily.      methocarbamol 500 MG tablet   Commonly known as: ROBAXIN   Take 500 mg by mouth every 6 (six) hours as needed. spasms      Mometasone Furo-Formoterol Fum 200-5 MCG/ACT Aero   Inhale 2 puffs into the lungs 2 (two) times daily.      omeprazole 20 MG capsule   Commonly known as: PRILOSEC   Take 20 mg by mouth daily.      oxyCODONE-acetaminophen 10-325 MG per tablet   Commonly known as: PERCOCET   Take 1 tablet by mouth 3 (three) times daily as needed. For pain      solifenacin 5 MG tablet   Commonly known as: VESICARE   Take 5 mg by  mouth daily.             At home rest most of the time. Get up 9 or 10 times each day and take a 15 or 20 minute walk. No riding in the car and to your first postoperative appointment. If you have neck surgery you may shower from the chest down starting on the third postoperative day. If you had back surgery he may start showering on the third postoperative day with saran wrap wrapped around your incisional area 3 times. After the shower remove the saran wrap. Take pain medicine as needed and other medications as instructed. Call my office for an appointment.  SignedReinaldo Meeker, MD 05/20/2012, 9:19 AM

## 2012-05-24 ENCOUNTER — Encounter (HOSPITAL_BASED_OUTPATIENT_CLINIC_OR_DEPARTMENT_OTHER): Payer: Medicaid Other | Attending: General Surgery

## 2012-05-27 ENCOUNTER — Encounter (HOSPITAL_BASED_OUTPATIENT_CLINIC_OR_DEPARTMENT_OTHER): Payer: Medicaid Other

## 2012-06-15 ENCOUNTER — Encounter (HOSPITAL_COMMUNITY): Payer: Self-pay | Admitting: Emergency Medicine

## 2012-06-15 ENCOUNTER — Emergency Department (HOSPITAL_COMMUNITY): Payer: Medicaid Other

## 2012-06-15 ENCOUNTER — Emergency Department (HOSPITAL_COMMUNITY)
Admission: EM | Admit: 2012-06-15 | Discharge: 2012-06-15 | Disposition: A | Discharge status: Home / Self Care | Payer: Medicaid Other | Attending: Emergency Medicine | Admitting: Emergency Medicine

## 2012-06-15 DIAGNOSIS — T1490XA Injury, unspecified, initial encounter: Secondary | ICD-10-CM | POA: Insufficient documentation

## 2012-06-15 DIAGNOSIS — I10 Essential (primary) hypertension: Secondary | ICD-10-CM | POA: Insufficient documentation

## 2012-06-15 DIAGNOSIS — M549 Dorsalgia, unspecified: Secondary | ICD-10-CM

## 2012-06-15 DIAGNOSIS — M79606 Pain in leg, unspecified: Secondary | ICD-10-CM

## 2012-06-15 DIAGNOSIS — W010XXA Fall on same level from slipping, tripping and stumbling without subsequent striking against object, initial encounter: Secondary | ICD-10-CM | POA: Insufficient documentation

## 2012-06-15 DIAGNOSIS — R609 Edema, unspecified: Secondary | ICD-10-CM | POA: Insufficient documentation

## 2012-06-15 DIAGNOSIS — M79609 Pain in unspecified limb: Secondary | ICD-10-CM | POA: Insufficient documentation

## 2012-06-15 DIAGNOSIS — R209 Unspecified disturbances of skin sensation: Secondary | ICD-10-CM | POA: Insufficient documentation

## 2012-06-15 DIAGNOSIS — W19XXXA Unspecified fall, initial encounter: Secondary | ICD-10-CM

## 2012-06-15 LAB — CBC WITH DIFFERENTIAL/PLATELET
Basophils Absolute: 0 10*3/uL (ref 0.0–0.1)
Basophils Relative: 0 % (ref 0–1)
Eosinophils Absolute: 0.5 10*3/uL (ref 0.0–0.7)
Eosinophils Relative: 5 % (ref 0–5)
HCT: 39.4 % (ref 36.0–46.0)
Hemoglobin: 13.2 g/dL (ref 12.0–15.0)
Lymphocytes Relative: 25 % (ref 12–46)
Lymphs Abs: 2.6 10*3/uL (ref 0.7–4.0)
MCH: 30.6 pg (ref 26.0–34.0)
MCHC: 33.5 g/dL (ref 30.0–36.0)
MCV: 91.2 fL (ref 78.0–100.0)
Monocytes Absolute: 0.8 10*3/uL (ref 0.1–1.0)
Monocytes Relative: 7 % (ref 3–12)
Neutro Abs: 6.8 10*3/uL (ref 1.7–7.7)
Neutrophils Relative %: 63 % (ref 43–77)
Platelets: 282 10*3/uL (ref 150–400)
RBC: 4.32 MIL/uL (ref 3.87–5.11)
RDW: 14.5 % (ref 11.5–15.5)
WBC: 10.8 10*3/uL — ABNORMAL HIGH (ref 4.0–10.5)

## 2012-06-15 LAB — BASIC METABOLIC PANEL
BUN: 14 mg/dL (ref 6–23)
CO2: 28 mEq/L (ref 19–32)
Calcium: 9.4 mg/dL (ref 8.4–10.5)
Chloride: 99 mEq/L (ref 96–112)
Creatinine, Ser: 0.78 mg/dL (ref 0.50–1.10)
GFR calc Af Amer: >90 mL/min (ref >90)
GFR calc non Af Amer: >90 mL/min (ref >90)
Glucose, Bld: 96 mg/dL (ref 70–99)
Potassium: 3.6 mEq/L (ref 3.5–5.1)
Sodium: 136 mEq/L (ref 135–145)

## 2012-06-15 LAB — URINALYSIS, ROUTINE W REFLEX MICROSCOPIC
Bilirubin Urine: NEGATIVE
Glucose, UA: NEGATIVE mg/dL
Hgb urine dipstick: NEGATIVE
Ketones, ur: 15 mg/dL — AB
Leukocytes, UA: NEGATIVE
Nitrite: NEGATIVE
Protein, ur: NEGATIVE mg/dL
Specific Gravity, Urine: 1.023 (ref 1.005–1.030)
Urobilinogen, UA: 0.2 mg/dL (ref 0.0–1.0)
pH: 6 (ref 5.0–8.0)

## 2012-06-15 LAB — POCT PREGNANCY, URINE: Preg Test, Ur: NEGATIVE

## 2012-06-15 IMAGING — CR DG KNEE COMPLETE 4+V*L*
4 series · 4 of 4 positions shown · non-contrast
Comparison: None.

CLINICAL DATA: Left knee pain and swelling.  History of broken
kneecap.

LEFT KNEE - COMPLETE 4+ VIEW

[t knee ap left *]
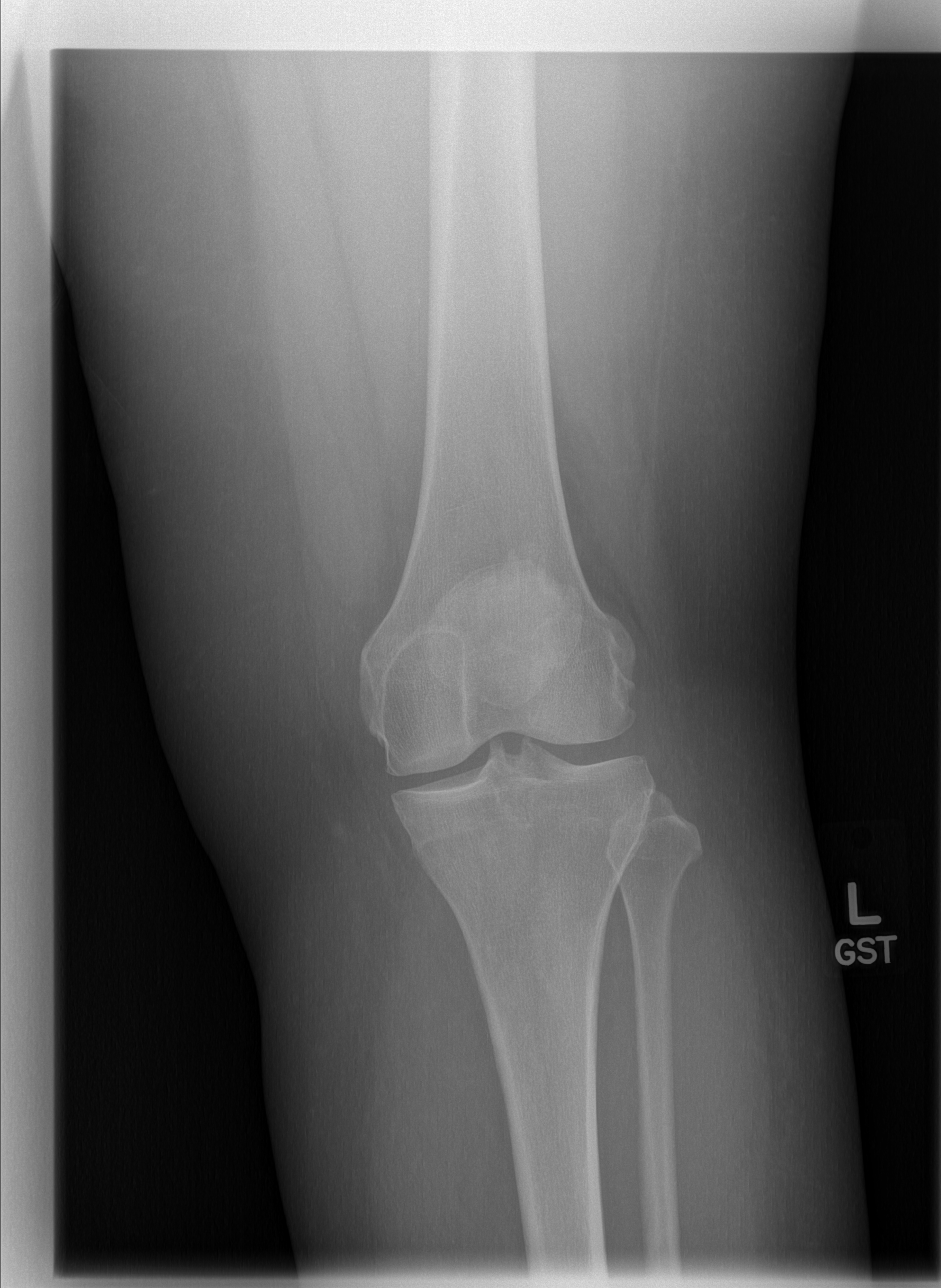

[t knee oblique left (1 of 2)]
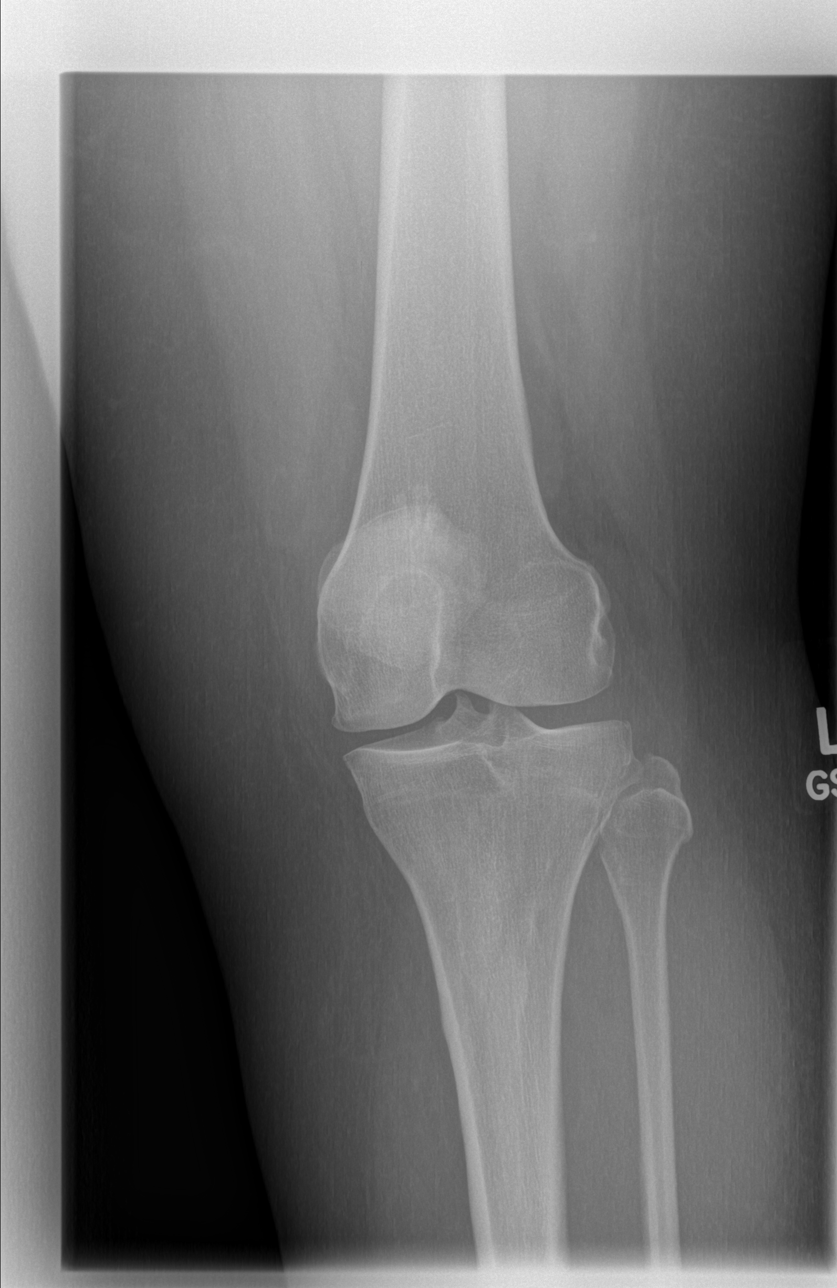

[t knee oblique left (2 of 2)]
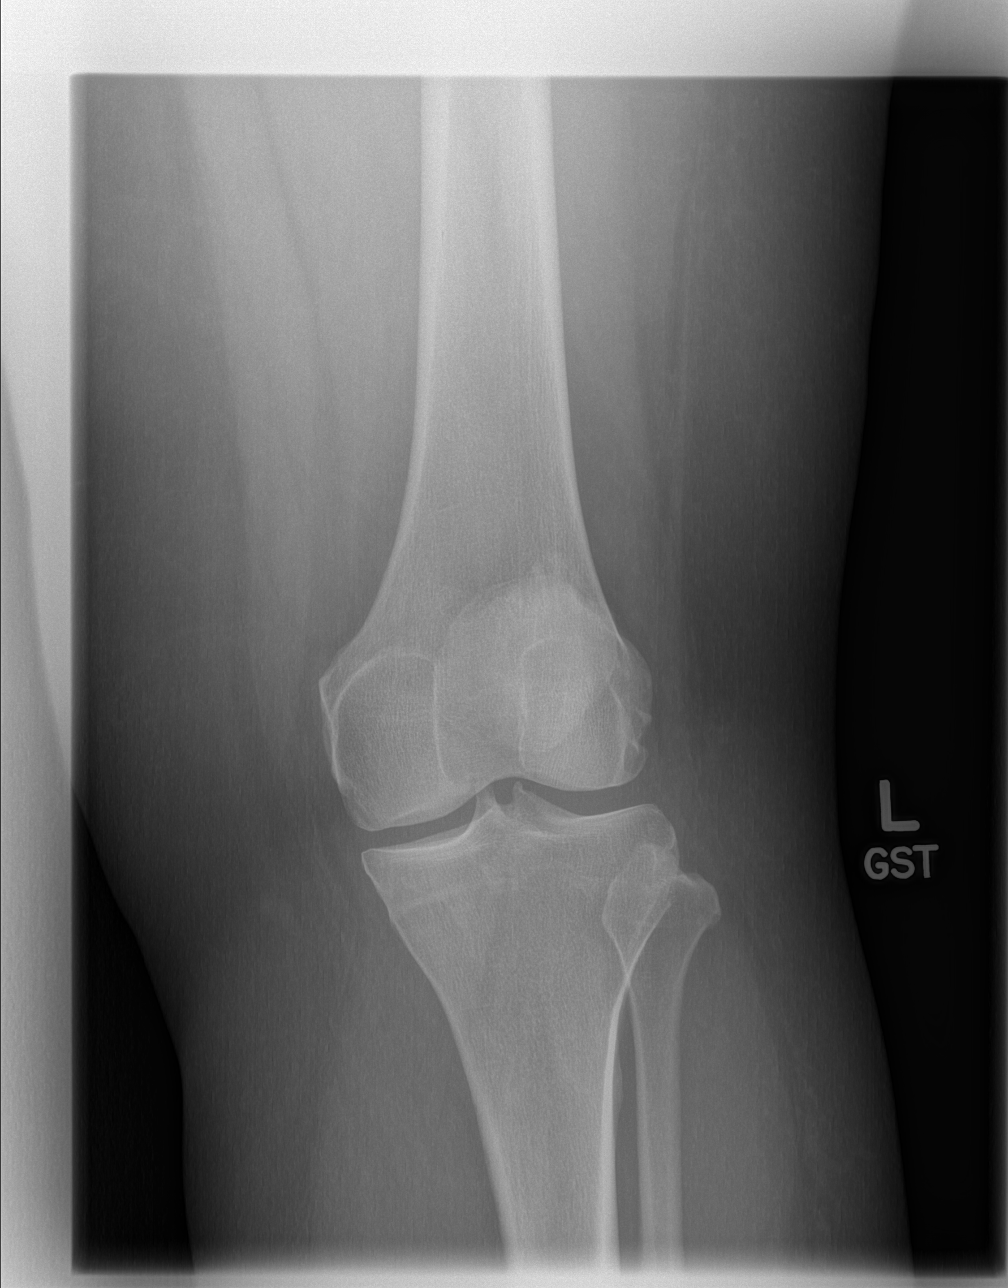

[t knee lat left]
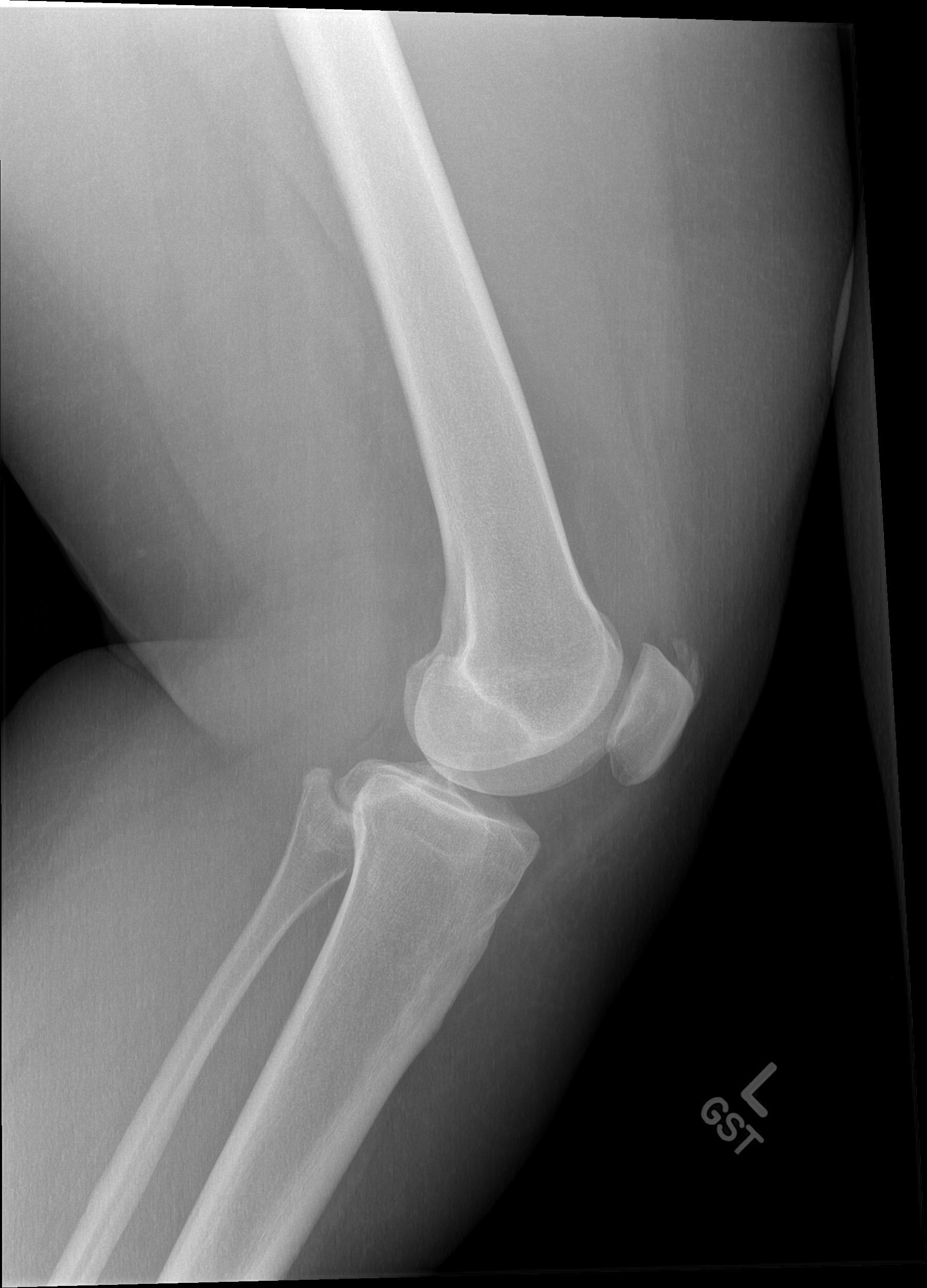

[4 of 4 positions shown; findings below may reference images not displayed]

FINDINGS: Alignment of the left knee is anatomic.  There may be a
small effusion.  Enthesopathy of the insertion of the quadriceps
tendon.  No fracture is identified.
IMPRESSION: No acute osseous abnormality.

## 2012-06-15 MED ORDER — MORPHINE SULFATE 4 MG/ML IJ SOLN
4.0000 mg | Freq: Once | INTRAMUSCULAR | Status: AC
  Administered 2012-06-15: 4 mg via INTRAMUSCULAR
  Filled 2012-06-15: qty 1

## 2012-06-15 MED ORDER — MELOXICAM 15 MG PO TABS: 15.0000 mg | ORAL_TABLET | Freq: Every day | ORAL | Status: DC

## 2012-06-15 NOTE — ED Notes (Signed)
NAD noted at time of d/c home with friend 

## 2012-06-15 NOTE — ED Provider Notes (Signed)
History     CSN: [id number]  Arrival date & time 06/15/12  1200   First MD Initiated Contact with Patient 06/15/12 1204      Chief Complaint  Patient presents with  . Leg Pain    (Consider location/radiation/quality/duration/timing/severity/associated sxs/prior treatment) HPI Comments: 52 Y Min[place]an 41 y.o. female   The chief complaint is: Patient presents with:   Leg Pain    41 year old female with a complicated past medical history presents today after fall. She is a chief complaint of left lower back, left hip, left lower leg and foot. Pain. She states that she was walking down a hill in flip-flops on wet grass when she slipped. She states that her left leg twisted around behind her. She felt her hip pop out of place, and popped back in. She claims that she is in excruciating pain that she took 2 of her regular prescribed Percocet and has had no relief. She has had history of CHF, and back surgery. She is a currently a current smoker. She is swelling bilaterally in both legs. She denies any fevers, chills, nausea, vomiting. She denies any myalgias, arthralgias. She denies tingling. She has left foot numbness from previous back surgery. She denies unilateral leg swelling or shortness of breath. 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 female presenting with leg pain. The history is provided by the patient and medical records. No language interpreter was used.  Leg Pain  The incident occurred 6 to 12 hours ago. The incident occurred in the yard. The injury mechanism was a fall. The pain is present in the left foot, left hip and left leg. The quality of the pain is described as aching. The pain is at a severity of 7/10. The pain is severe. The pain has been constant since onset. Associated symptoms include numbness (numbness precedes incident) and inability to bear weight. Pertinent negatives include no loss of motion, no muscle weakness, no loss of  sensation and no tingling. Treatments tried: She took 2 Percocet 10 mg without relief. The treatment provided no relief.    Past Medical History  Diagnosis Date  . Asthma   . Hypertension   . Anxiety   . Snoring disorder     Pt stated my boyfriend always wakes me up and tells me to breathe  . Heart murmur   . CHF (congestive heart failure)   . Shortness of breath   . Bronchitis     hx of  . Bipolar affective disorder     Schizophrenia  . Depression   . GERD (gastroesophageal reflux disease)   . Arthritis   . Anginal pain   . Overactive bladder   . Schizophrenia     Past Surgical History  Procedure Date  . Rotator cuff repair     Right shoulder  . Eye surgery     Metal plate in right eye  . Tubal ligation   . Gallstones reomved   . Lumbar laminectomy/decompression microdiscectomy 04/01/2012    Procedure: LUMBAR LAMINECTOMY/DECOMPRESSION MICRODISCECTOMY 2 LEVELS;  Surgeon: Reinaldo Meeker, MD;  Location: [facility];  Service: Neurosurgery;  Laterality: Left;  Lumbar four-five, lumbar five sacral one microdiscectomy   . Back surgery   . Lumbar wound debridement 04/29/2012    Procedure: LUMBAR WOUND DEBRIDEMENT;  Surgeon: Reinaldo Meeker, MD;  Location: [facility];  Service: Neurosurgery;  Laterality: N/A;  lumbar wound debridement    No family history  on file.  History  Substance Use Topics  . Smoking status: Current Every Day Smoker -- 0.2 packs/day for 9 years    Types: Cigarettes  . Smokeless tobacco: Never Used  . Alcohol Use: Yes     social    OB History    Grav Para Term Preterm Abortions TAB SAB Ect Mult Living                  Review of Systems  Constitutional: Negative for fever and chills.  HENT: Positive for mouth sores. Negative for trouble swallowing.   Respiratory: Negative for shortness of breath.   Cardiovascular: Positive for leg swelling. Negative for chest pain and palpitations.  Gastrointestinal: Negative for nausea, vomiting,  abdominal pain and diarrhea.  Genitourinary: Negative for dysuria, frequency, hematuria, flank pain and pelvic pain.  Musculoskeletal: Positive for back pain and gait problem. Negative for joint swelling and arthralgias.  Skin: Negative for color change and rash.  Neurological: Positive for numbness (numbness precedes incident). Negative for dizziness, tingling, syncope, speech difficulty, weakness, light-headedness and headaches.  All other systems reviewed and are negative.    Allergies  Review of patient's allergies indicates no known allergies.  Home Medications   Current Outpatient Rx  Name Route Sig Dispense Refill  . ALBUTEROL SULFATE HFA 108 (90 BASE) MCG/ACT IN AERS Inhalation Inhale 2 puffs into the lungs every 6 (six) hours as needed. For shortness of breath    . AMLODIPINE BESYLATE 10 MG PO TABS Oral Take 10 mg by mouth daily.    . ARIPIPRAZOLE 5 MG PO TABS Oral Take 5 mg by mouth daily.    [name] CARISOPRODOL 350 MG PO TABS Oral Take 350 mg by mouth 2 (two) times daily. For muscle spasms    . DEXLANSOPRAZOLE 60 MG PO CPDR Oral Take 60 mg by mouth daily.    [name] DIAZEPAM 5 MG PO TABS Oral Take 5 mg by mouth every 6 (six) hours as needed. For spasms    . DICLOFENAC SODIUM 75 MG PO TBEC Oral Take 75 mg by mouth 2 (two) times daily.    [name] ESOMEPRAZOLE MAGNESIUM 40 MG PO CPDR Oral Take 40 mg by mouth daily before breakfast.    . FAMOTIDINE 20 MG PO TABS Oral Take 20 mg by mouth at bedtime as needed. One at bedtime    . FUROSEMIDE 40 MG PO TABS Oral Take 40 mg by mouth daily.    [name] HYDROCHLOROTHIAZIDE 12.5 MG PO CAPS Oral Take 12.5 mg by mouth daily.    . MOMETASONE FURO-FORMOTEROL FUM 200-5 MCG/ACT IN AERO Inhalation Inhale 2 puffs into the lungs 2 (two) times daily.     [name] OMEPRAZOLE 20 MG PO CPDR Oral Take 20 mg by mouth daily.    . OXYCODONE-ACETAMINOPHEN 10-325 MG PO TABS Oral Take 1 tablet by mouth 3 (three) times daily as needed. For pain    . SOLIFENACIN SUCCINATE 5 MG PO TABS Oral  Take 5 mg by mouth daily.      BP 129/86  Pulse 92  Temp 97.4 F (36.3 C) (Oral)  Resp 20  SpO2 98%  Physical Exam  Constitutional: She is oriented to person, place, and time. She appears well-developed and well-nourished. No distress.  HENT:  Head: Normocephalic and atraumatic.  Eyes: Conjunctivae normal are normal. No scleral icterus.  Neck: Normal range of motion.  Cardiovascular: Normal rate, regular rhythm, normal heart sounds and intact distal pulses.  Exam reveals no gallop and no friction  rub.   No murmur heard. Pulmonary/Chest: Effort normal and breath sounds normal. No respiratory distress. She has no wheezes. She exhibits no tenderness.  Abdominal: Soft. Bowel sounds are normal. She exhibits no distension and no mass. There is no tenderness. There is no guarding.  Musculoskeletal:       Bilateral 2+ pitting edema. Distal pulses intact. Exquisite tenderness to palpation of the left dorsal side of the foot/ tenderness to palpation of the entire left lower leg. Patient is tender to palpation of the left gluteal and sacrum. There is no bony tenderness to the trochanter. No knee tenderness. She is able to wiggle her toes. Gross Sensation intact  Lymphadenopathy:    She has no cervical adenopathy.  Neurological: She is alert and oriented to person, place, and time.  Skin: Skin is warm and dry. She is not diaphoretic.       There is an area of poorly demarcated redness along the left medial shin. May represent developing cellulitis. Tender to palpation warm to touch.  Psychiatric: She has a normal mood and affect. Her behavior is normal.    ED Course  Procedures (including critical care time) Results for orders placed during the hospital encounter of 06/15/12  CBC WITH DIFFERENTIAL      Component Value Range   WBC 10.8 (*) 4.0 - 10.5 K/uL   RBC 4.32  3.87 - 5.11 MIL/uL   Hemoglobin 13.2  12.0 - 15.0 g/dL   HCT 30.8  65.7 - 84.6 %   MCV 91.2  78.0 - 100.0 fL   MCH 30.6   26.0 - 34.0 pg   MCHC 33.5  30.0 - 36.0 g/dL   RDW 96.2  95.2 - 84.1 %   Platelets 282  150 - 400 K/uL   Neutrophils Relative 63  43 - 77 %   Neutro Abs 6.8  1.7 - 7.7 K/uL   Lymphocytes Relative 25  12 - 46 %   Lymphs Abs 2.6  0.7 - 4.0 K/uL   Monocytes Relative 7  3 - 12 %   Monocytes Absolute 0.8  0.1 - 1.0 K/uL   Eosinophils Relative 5  0 - 5 %   Eosinophils Absolute 0.5  0.0 - 0.7 K/uL   Basophils Relative 0  0 - 1 %   Basophils Absolute 0.0  0.0 - 0.1 K/uL    No results found. DG Hip Complete Left (Final result)   Result time:06/15/12 1420    Final result by Rad Results In Interface (06/15/12 14:20:17)    Narrative:   *RADIOLOGY REPORT*  Clinical Data: Fall, leg pain.  LEFT HIP - COMPLETE 2+ VIEW  Comparison: None.  Findings: Mild joint space narrowing and spurring in the hips bilaterally. SI joints are symmetric and unremarkable. No acute bony abnormality. Specifically, no fracture, subluxation, or dislocation. Soft tissues are intact.  IMPRESSION: No acute bony abnormality.   Original Report Authenticated By: Cyndie Chime, M.D.             DG Tibia/Fibula Left (Final result)   Result time:06/15/12 1422    Final result by Rad Results In Interface (06/15/12 14:22:38)    Narrative:   *RADIOLOGY REPORT*  Clinical Data: Leg pain.  LEFT TIBIA AND FIBULA - 2 VIEW  Comparison: 06/15/2012  Findings: No acute bony abnormality. Specifically, no fracture, subluxation, or dislocation. Soft tissues are intact.  IMPRESSION: No acute bony abnormality.   Original Report Authenticated By: Cyndie Chime, M.D.  DG Foot Complete Left (Final result)   Result time:06/15/12 1420    Final result by Rad Results In Interface (06/15/12 14:20:17)    Narrative:   *RADIOLOGY REPORT*  Clinical Data: Leg pain  LEFT FOOT - COMPLETE 3+ VIEW  Comparison: None.  Findings: Three views of the left foot submitted. No acute fracture or subluxation.  Soft tissue swelling noted dorsal metatarsal region. Posterior spur of the calcaneus noted at the Achilles tendon insertion.  IMPRESSION: No acute fracture or subluxation. Soft tissue swelling dorsal metatarsal region. Posterior spurring of the calcaneus.   Original Report Authenticated By: Natasha Mead, M.D.          1. Fall   2. Leg pain   3. Back pain       MDM  Patient with chronic back pain. No signs of fracture on xray, no ecchymosis or deformities. No neurologic deficits.  Patient with narcotic pain medications at home.  I  Will d/c patient home with mobic and she may follow up with her PCP. Discussed reasons to seek immediate care. Patient expresses understanding and agrees with plan.         Arthor Captain, PA-C 06/18/12 1510

## 2012-06-15 NOTE — ED Notes (Signed)
PA-C in room at this time. Pt reports she fell onto her bottom while walking down grassy hill in flip flops. Felt as if something "popped" in the left leg. Pt reports numbness in left foot from recent back surgery

## 2012-06-15 NOTE — ED Notes (Signed)
Pt with c/o hip/leg/lower back pain after falling from standing position. Pt was walking down hill on grass, slipped and fell onto "butt". Ambulatory upon EMS arrival and took 2 10mg  percocet prior to EMS arrival. [facility] of back surgery

## 2012-06-22 NOTE — ED Provider Notes (Signed)
Medical screening examination/treatment/procedure(s) were performed by non-physician practitioner and as supervising physician I was immediately available for consultation/collaboration.   Celene Kras, MD 06/22/12 [phone]

## 2012-07-12 ENCOUNTER — Encounter (HOSPITAL_COMMUNITY): Payer: Self-pay | Admitting: Emergency Medicine

## 2012-07-12 ENCOUNTER — Emergency Department (HOSPITAL_COMMUNITY)
Admission: EM | Admit: 2012-07-12 | Discharge: 2012-07-12 | Disposition: A | Discharge status: Home / Self Care | Payer: Medicaid Other | Attending: Emergency Medicine | Admitting: Emergency Medicine

## 2012-07-12 ENCOUNTER — Emergency Department (HOSPITAL_COMMUNITY): Payer: Medicaid Other

## 2012-07-12 DIAGNOSIS — N318 Other neuromuscular dysfunction of bladder: Secondary | ICD-10-CM | POA: Insufficient documentation

## 2012-07-12 DIAGNOSIS — G8921 Chronic pain due to trauma: Secondary | ICD-10-CM | POA: Insufficient documentation

## 2012-07-12 DIAGNOSIS — I10 Essential (primary) hypertension: Secondary | ICD-10-CM | POA: Insufficient documentation

## 2012-07-12 DIAGNOSIS — F3289 Other specified depressive episodes: Secondary | ICD-10-CM | POA: Insufficient documentation

## 2012-07-12 DIAGNOSIS — F329 Major depressive disorder, single episode, unspecified: Secondary | ICD-10-CM | POA: Insufficient documentation

## 2012-07-12 DIAGNOSIS — I509 Heart failure, unspecified: Secondary | ICD-10-CM | POA: Insufficient documentation

## 2012-07-12 DIAGNOSIS — Z79899 Other long term (current) drug therapy: Secondary | ICD-10-CM | POA: Insufficient documentation

## 2012-07-12 DIAGNOSIS — Z87898 Personal history of other specified conditions: Secondary | ICD-10-CM | POA: Insufficient documentation

## 2012-07-12 DIAGNOSIS — F209 Schizophrenia, unspecified: Secondary | ICD-10-CM | POA: Insufficient documentation

## 2012-07-12 DIAGNOSIS — K219 Gastro-esophageal reflux disease without esophagitis: Secondary | ICD-10-CM | POA: Insufficient documentation

## 2012-07-12 DIAGNOSIS — F17200 Nicotine dependence, unspecified, uncomplicated: Secondary | ICD-10-CM | POA: Insufficient documentation

## 2012-07-12 DIAGNOSIS — M545 Low back pain, unspecified: Secondary | ICD-10-CM | POA: Insufficient documentation

## 2012-07-12 DIAGNOSIS — M129 Arthropathy, unspecified: Secondary | ICD-10-CM | POA: Insufficient documentation

## 2012-07-12 DIAGNOSIS — F319 Bipolar disorder, unspecified: Secondary | ICD-10-CM | POA: Insufficient documentation

## 2012-07-12 DIAGNOSIS — F411 Generalized anxiety disorder: Secondary | ICD-10-CM | POA: Insufficient documentation

## 2012-07-12 DIAGNOSIS — J45909 Unspecified asthma, uncomplicated: Secondary | ICD-10-CM | POA: Insufficient documentation

## 2012-07-12 DIAGNOSIS — M549 Dorsalgia, unspecified: Secondary | ICD-10-CM

## 2012-07-12 MED ORDER — CYCLOBENZAPRINE HCL 10 MG PO TABS
10.0000 mg | ORAL_TABLET | Freq: Once | ORAL | Status: AC
  Administered 2012-07-12: 10 mg via ORAL
  Filled 2012-07-12: qty 1

## 2012-07-12 MED ORDER — CYCLOBENZAPRINE HCL 10 MG PO TABS: 10.0000 mg | ORAL_TABLET | Freq: Two times a day (BID) | ORAL | Status: DC | PRN

## 2012-07-12 MED ORDER — OXYCODONE-ACETAMINOPHEN 5-325 MG PO TABS
1.0000 | ORAL_TABLET | Freq: Once | ORAL | Status: AC
  Administered 2012-07-12: 1 via ORAL
  Filled 2012-07-12: qty 1

## 2012-07-12 MED ORDER — OXYCODONE-ACETAMINOPHEN 5-325 MG PO TABS: 1.0000 | ORAL_TABLET | Freq: Four times a day (QID) | ORAL | Status: DC | PRN

## 2012-07-12 NOTE — Progress Notes (Signed)
General information Packet  Medicaid accepting [place] Providers  [facility] [address]  [phone] 9am-7pm Mon-Fri 9 am -1pm Sat Speak with Pam H if you are self pay (no insurance coverage)   [facility] [phone]  Dr Edwin Avbeure at [facility] [address]  [place], [place], [address] [phone] [address]  [place] [phone] Dr Mohammed Garba at [facility]   [address] [place], [place] [address] [phone]   Dr.  Elizabeth Dewey [facility]-[address]-[place] [address] [phone]  [facility]  [address] [place] [address] [phone] Mon-Fri 8-5 closed 12-1 for lunch   Veita Bland Only accepts [address] Access Medicaid patients after they have Dr. Bland's name applied to the medicaid card [address] [place], [place] [address] [phone]  Self-pay/Un-insured (patient that does not have insurance coverage) [place] providers  [facility] [place] [address] [place] [address] Ph [phone] Hours Mon-Wed 8:30am-5pm & Thurs 8:30am-8pm $5 per visit/Call for an eligibility appointment  [facility] [address]  [phone] 9am-7pm Mon-Fri --9 am -1pm Sat Speak with Pam H if you are self pay (no insurance coverage)   [facility]  [address] [place] [address] [place], [place] [address] [phone] [place] Ph [phone] take most major insurance cards, Medicare and Medicaid. [email or web address] $45 per visit/Walk-in only Offer a payment plan for Uninsured also Staff also speak Spanish   [facility] [address] [place] [address] Ph [phone] Every 2nd Saturday 9am-12pm [email or web address] FREE Services [facility] [address] [place] [address] [place] [address]  [phone], [place]  [phone] Dr George Osei-Bonsu  [facility]: [address] [place] [place], [place] [address] Phone: [phone]  [facility]  [address] [place] [address], [place]  [phone]    [address], [place]  [phone]  [facility] [address] [place] [address] Clinic hours 1st & 3rd Saturdays of every month 10-1 pm [phone] fax [phone] - No Case manager, Limited services [email or web address] Sliding fee scale/Call to make an appointment [facility] [phone]  [facility]-- [address], [place] [phone] or [address], [place], [place], [address]   Sickle Cell patient can go to [facility] Dr Eric Dean MCR MCD [place] [facility] [address] [place] [phone] Tywan Lindsey, LCSW [phone] Jackie NP  [facility] [phone] or Physician Referral service [phone] choose option #2 or 1 [phone] or [facility] [phone]  Inclusive Health Insurance [phone] Monday-Friday 8AM-5Pm EST or email [email or web address] created for [place] with pre-existing conditions looking for more affordable health coverage. It includes two options, Inclusive Health - Federal Option is for people who have been without coverage for 6 months or more. Inclusive Health - State Option is for anyone else. Each of these options has its own eligibility criteria  [facility] [address], [address]  [place],  [address] [phone]  DEPARTMENTS OF SOCIAL SERVICES/HEALTH DEPARTMENTS BY COUNTY [place]:  [place]:   [phone] (main) [address] [place], [place] [address]    **Medicaid Transportation: 641-4848 Child Protective Services: 641-3795 Adult Protective Services: 641-3137 APS and CPS  after-hours: [phone] Domestic Violence Crisis Line: [phone] Crisis Intervention: 641-2517 or 641-2518 Food Stamps: 641-6996 or 641-3869 Services for the Blind: 641-4692 [place]: [phone] (main) [address] [place] [address] **Medicaid Transportation: 845-4848 Domestic Violence Crisis Line: 889-7273  EXTRA INFORMATION [place] [facility] Department (main): 641-7777 Maternity Care Coordination ([place]): 641-3085 Child Service Coordination/Early Intervention Program/Heather Carter: 641-3181 Maternity Program - Women's Health: [phone] WIC: 641-3214 (Ann Smith is very helpful.  I always ask for her)  **Medication assistance program contact number at health dept [phone]   **COST EFFICIENT PLACES TO GET MEDICATIONS--[facility], [facility] &[facility] have $4 generic medication programs- **[employer] offers Free 30 day generic antibiotics and diabetic medications --[facility] -Has Self pay applications also   **www.needymeds.org for drug patient assistance programs--Helpline calls dial [phone].  [facility] has Wal-mart brand Insulin without a RX for $24.98 -- 70/30,  Relion Humulin R,  [place] N Novolin/ReliOn Human Insulin Novolin/ReliOn is human insulin that works to lower your blood sugar (glucose). Novolin/ReliOn is manufactured for [employer] by Novo Nordisk, the world's leader in insulin production. The Novolin/ReliOn line includes the following types of insulin: . Novolin N (NPH human insulin [rDNA origin] isophane suspension) . Novolin R U-100 (regular insulin human injection, USP [rDNA origin]) . Novolin 70/30 (70% human insulin isophane suspension, 30% human insulin injection [rDNA origin]) Novolin/ReliOn Human Insulin - $24.88 Any change in insulin should be made cautiously and only under the supervision of a doctor. Novolin is a registered trademark of Novo Nordisk. Insulin syringes $13.33 for box of 100 syringes  [facility] -Offers Discounts   Pharmacy that delivers depending on location [facility] [address] [place], [place] [address] Phone: [phone] Fax: [phone] [facility] [address] [place] [address], [place] [address] [phone] fax [phone] M-F 9a-7p Sat 10a-3 pm Sunday closed    HOUSING/SHELTERS  Housing Authorities: [place]: [phone]  FINANCIAL ASSISTANCE     Department of Social Services [phone]   [facility] [employer] [facility]: [phone], [address], [place], [place]    [facility] [place]: [phone], [address] [place]   [facility] [place]: [phone], [address], [place], [place]   [facility] [place]: [phone], [address], [place], [place], [place]   [facility] [place]: [phone], [address] [place], [place]   [facility]: [phone], [address], [place], [place]    [facility] [place]: [phone], [address], [place]   [facility] [place]: [phone], [address], [place]   [facility] [place]: [phone], [address], [place] Housing We don't want anyone to have to sleep on the streets.  The [facility]'s [facility] Housing program can help you with referrals, information and education about: *Rental housing  *Transitional housing  *Residential treatment    *Emergency overnight shelter  *Tenant and landlord rights  If you are in immediate need of a bed in an emergency shelter please come to the [facility] ([facility]- [address], [place], [place] [address] [phone]) by 2:00 pm Monday-Friday and sign in at the front desk.  Come if you need showers, laundry, computer access, job counseling, resume help, referrals for food/clothing, Mental Health assistance and housing counseling Please   note: the bed we secure for you may not be in [place]; we work with emergency shelters throughout the  region. To make an appointment to talk with a counselor about long-term housing solutions please call [phone] Homeless [facility] Organizing for justice, equality, dignity, worth and the enormous potential of all people.[facility] [address] [place] [address] from 5AM to 8#0 am Monday, Tuesday, Thursday & Friday [facility] [address], [place], [place] [address] Contact: [phone] [facility] Emergency Assistance Program (EAP) provides food and financial aid to people in need in [place]. No referrals are needed for food or financial assistance. .Financial interviews available: Mon- Fri 9:00 A.M. - 4:00 P.M. For More Information, contact Emergency Assistance: [phone] When [facility] [facility] provides financial assistance the money will be paid directly to the provider of the service or need. A [place] [facility] Application and/or Agreement must  be completed prior to services provided.  All applications and agreements for services will be made at [facility] at [address], [facility] at [address], or [facility] at [address]. / or email    

## 2012-07-12 NOTE — Progress Notes (Signed)
   CARE MANAGEMENT ED NOTE 07/12/2012  Patient:  Lori Bean, Lori Bean   Account Number:  [id number]  Date Initiated:  07/12/2012  Documentation initiated by:  Fransico Michael  Subjective/Objective Assessment:   presented to ED with c/o back pain     Subjective/Objective Assessment Detail:     Action/Plan:   Action/Plan Detail:   Anticipated DC Date:  07/12/2012     Status Recommendation to Physician:   Result of Recommendation:      DC Planning Services  CM consult    Choice offered to / List presented to:            Status of service:  Completed, signed off  ED Comments:   ED Comments Detail:  07/12/12-1021-J.Brinlee Gambrell,RN,BSN [phone]      In to see patient. Reports being a patient at [facility]. General information packet including information on primary care physicians, medication assistance information, and DSS address given to and reviewed with patient. No further needs identified at this time.

## 2012-07-12 NOTE — ED Notes (Signed)
Pt reports she fell down a hill 1 month ago and has had intermittent pain in left lower back and left hip since.  States pain worse over past couple of days and "I can't take this pain no more".

## 2012-07-12 NOTE — ED Provider Notes (Addendum)
History     CSN: [id number]  Arrival date & time 07/12/12  4098   First MD Initiated Contact with Patient 07/12/12 [phone]      Chief Complaint  Patient presents with  . Back Pain    Left lower back and left hip    (Consider location/radiation/quality/duration/timing/severity/associated sxs/prior treatment) Patient is a 41 y.o. female presenting with back pain. The history is provided by the patient.  Back Pain  This is a recurrent problem. Episode onset: started 1 month ago after a fall. The problem occurs constantly. The problem has been gradually worsening. The pain is associated with falling. The pain is present in the lumbar spine. The quality of the pain is described as stabbing and aching. The pain does not radiate. The pain is at a severity of 8/10. The pain is moderate. The symptoms are aggravated by bending, twisting and certain positions. The pain is the same all the time. Stiffness is present in the morning. Pertinent negatives include no abdominal pain, no bowel incontinence, no bladder incontinence, no dysuria, no leg pain, no paresthesias, no paresis and no weakness. She has tried NSAIDs for the symptoms. The treatment provided no relief.    Past Medical History  Diagnosis Date  . Asthma   . Hypertension   . Anxiety   . Snoring disorder     Pt stated my boyfriend always wakes me up and tells me to breathe  . Heart murmur   . CHF (congestive heart failure)   . Shortness of breath   . Bronchitis     hx of  . Bipolar affective disorder     Schizophrenia  . Depression   . GERD (gastroesophageal reflux disease)   . Arthritis   . Anginal pain   . Overactive bladder   . Schizophrenia     Past Surgical History  Procedure Date  . Rotator cuff repair     Right shoulder  . Eye surgery     Metal plate in right eye  . Tubal ligation   . Gallstones reomved   . Lumbar laminectomy/decompression microdiscectomy 04/01/2012    Procedure: LUMBAR LAMINECTOMY/DECOMPRESSION  MICRODISCECTOMY 2 LEVELS;  Surgeon: Reinaldo Meeker, MD;  Location: [facility];  Service: Neurosurgery;  Laterality: Left;  Lumbar four-five, lumbar five sacral one microdiscectomy   . Back surgery   . Lumbar wound debridement 04/29/2012    Procedure: LUMBAR WOUND DEBRIDEMENT;  Surgeon: Reinaldo Meeker, MD;  Location: [facility];  Service: Neurosurgery;  Laterality: N/A;  lumbar wound debridement    History reviewed. No pertinent family history.  History  Substance Use Topics  . Smoking status: Current Every Day Smoker -- 0.2 packs/day for 9 years    Types: Cigarettes  . Smokeless tobacco: Never Used  . Alcohol Use: Yes     social    OB History    Grav Para Term Preterm Abortions TAB SAB Ect Mult Living                  Review of Systems  Gastrointestinal: Negative for abdominal pain and bowel incontinence.  Genitourinary: Negative for bladder incontinence and dysuria.  Musculoskeletal: Positive for back pain.  Neurological: Negative for weakness and paresthesias.  All other systems reviewed and are negative.    Allergies  Review of patient's allergies indicates no known allergies.  Home Medications   Current Outpatient Rx  Name Route Sig Dispense Refill  . ALBUTEROL SULFATE HFA 108 (90 BASE) MCG/ACT IN AERS Inhalation  Inhale 2 puffs into the lungs every 6 (six) hours as needed. For shortness of breath    . AMLODIPINE BESYLATE 10 MG PO TABS Oral Take 10 mg by mouth daily.    . ARIPIPRAZOLE 5 MG PO TABS Oral Take 5 mg by mouth daily.    [name] CARISOPRODOL 350 MG PO TABS Oral Take 350 mg by mouth 2 (two) times daily. For muscle spasms    . DEXLANSOPRAZOLE 60 MG PO CPDR Oral Take 60 mg by mouth daily.    [name] DIAZEPAM 5 MG PO TABS Oral Take 5 mg by mouth every 6 (six) hours as needed. For spasms    . DICLOFENAC SODIUM 75 MG PO TBEC Oral Take 75 mg by mouth 2 (two) times daily.    [name] ESOMEPRAZOLE MAGNESIUM 40 MG PO CPDR Oral Take 40 mg by mouth daily before breakfast.    .  FAMOTIDINE 20 MG PO TABS Oral Take 20 mg by mouth at bedtime as needed. One at bedtime    . FUROSEMIDE 40 MG PO TABS Oral Take 40 mg by mouth daily.    [name] HYDROCHLOROTHIAZIDE 12.5 MG PO CAPS Oral Take 12.5 mg by mouth daily.    . MELOXICAM 15 MG PO TABS Oral Take 1 tablet (15 mg total) by mouth daily. 10 tablet 0  . MOMETASONE FURO-FORMOTEROL FUM 200-5 MCG/ACT IN AERO Inhalation Inhale 2 puffs into the lungs 2 (two) times daily.     [name] OMEPRAZOLE 20 MG PO CPDR Oral Take 20 mg by mouth daily.    . OXYCODONE-ACETAMINOPHEN 10-325 MG PO TABS Oral Take 1 tablet by mouth 3 (three) times daily as needed. For pain    . SOLIFENACIN SUCCINATE 5 MG PO TABS Oral Take 5 mg by mouth daily.      BP 143/77  Pulse 80  Temp 98.3 F (36.8 C) (Oral)  Resp 20  Ht 5\' 2"  (1.575 m)  Wt 226 lb (102.513 kg)  BMI 41.34 kg/m2  SpO2 95%  LMP 06/28/2012  Physical Exam  Nursing note and vitals reviewed. Constitutional: She is oriented to person, place, and time. She appears well-developed and well-nourished. No distress.  HENT:  Head: Normocephalic and atraumatic.  Mouth/Throat: Oropharynx is clear and moist.  Eyes: EOM are normal. Pupils are equal, round, and reactive to light.  Neck: Normal range of motion. Neck supple. No spinous process tenderness and no muscular tenderness present. No rigidity. Normal range of motion present.  Cardiovascular: Normal rate, regular rhythm, normal heart sounds and intact distal pulses.   No murmur heard. Pulmonary/Chest: Effort normal and breath sounds normal. She has no wheezes. She has no rales.  Abdominal: Soft. She exhibits no distension. There is no tenderness. There is no CVA tenderness.  Musculoskeletal: She exhibits tenderness.       Lumbar back: She exhibits decreased range of motion, tenderness and pain. She exhibits no swelling, no deformity and normal pulse.  Neurological: She is alert and oriented to person, place, and time. Coordination normal.  Reflex Scores:       Patellar reflexes are 1+ on the right side and 1+ on the left side. Skin: Skin is warm and dry. No rash noted.  Psychiatric: She has a normal mood and affect.    ED Course  Procedures (including critical care time)  Labs Reviewed - No data to display Dg Lumbar Spine Complete  07/12/2012  *RADIOLOGY REPORT*  Clinical Data: Recent fall, low back pain  LUMBAR SPINE - COMPLETE 4+ VIEW  Comparison: Lumbar spine films of 04/01/2012  Findings: There is slight retrolisthesis of L5 on S1 by 5 mm. Minimally decreased disc space is noted at both L4-5 and to a lesser degree at L5-S1.  Surgical clips are noted in the right upper quadrant from prior cholecystectomy.  IMPRESSION:  1.  Slightly decreased disc space at L4-5 and to a lesser degree at L5-S1. 2.  Mild retrolisthesis of L5 on S1 by 5 mm   Original Report Authenticated By: Juline Patch, M.D.      1. Back pain       MDM    patient with a mechanical fall about one month ago who had plain films of her hip done here which were normal. She's had persistent left-sided back pain since that time but it got worse over the last few days when she started doing laundry and carrying her clothing basket. She denies radicular pain, incontinence or urinary retention. She is neurovascularly intact on exam. She does have a history of back surgery which she had done in July of this year and states the pain is similar to before she had her surgery done. As patient did not have a plain film of her lumbar spine taken initially when she fell we'll get this done and will give pain control. She's been taking ibuprofen at home without relief  10:18 AM Patient's plain film shows some disc space narrowing and some mild retrolisthesis. Discussed the finding with the patient however she has no neurovascular findings on exam and will have her followup with her neurosurgeon Dr. Gerlene Fee. Pt improved after pain meds.     Gwyneth Sprout, MD 07/12/12 1019  Gwyneth Sprout, MD 07/12/12 1032

## 2012-07-12 NOTE — ED Notes (Signed)
Pt resting quietly with eyes closed at this time.  Rates pain 7/10.

## 2012-08-15 ENCOUNTER — Encounter (HOSPITAL_COMMUNITY): Payer: Self-pay | Admitting: Nurse Practitioner

## 2012-08-15 ENCOUNTER — Emergency Department (HOSPITAL_COMMUNITY)
Admission: EM | Admit: 2012-08-15 | Discharge: 2012-08-15 | Disposition: A | Discharge status: Home / Self Care | Payer: Medicaid Other | Attending: Emergency Medicine | Admitting: Emergency Medicine

## 2012-08-15 DIAGNOSIS — R011 Cardiac murmur, unspecified: Secondary | ICD-10-CM | POA: Insufficient documentation

## 2012-08-15 DIAGNOSIS — Z8679 Personal history of other diseases of the circulatory system: Secondary | ICD-10-CM | POA: Insufficient documentation

## 2012-08-15 DIAGNOSIS — F329 Major depressive disorder, single episode, unspecified: Secondary | ICD-10-CM | POA: Insufficient documentation

## 2012-08-15 DIAGNOSIS — M545 Low back pain, unspecified: Secondary | ICD-10-CM | POA: Insufficient documentation

## 2012-08-15 DIAGNOSIS — G8929 Other chronic pain: Secondary | ICD-10-CM | POA: Insufficient documentation

## 2012-08-15 DIAGNOSIS — Z8739 Personal history of other diseases of the musculoskeletal system and connective tissue: Secondary | ICD-10-CM | POA: Insufficient documentation

## 2012-08-15 DIAGNOSIS — F411 Generalized anxiety disorder: Secondary | ICD-10-CM | POA: Insufficient documentation

## 2012-08-15 DIAGNOSIS — F259 Schizoaffective disorder, unspecified: Secondary | ICD-10-CM | POA: Insufficient documentation

## 2012-08-15 DIAGNOSIS — Z87891 Personal history of nicotine dependence: Secondary | ICD-10-CM | POA: Insufficient documentation

## 2012-08-15 DIAGNOSIS — Z8742 Personal history of other diseases of the female genital tract: Secondary | ICD-10-CM | POA: Insufficient documentation

## 2012-08-15 DIAGNOSIS — I509 Heart failure, unspecified: Secondary | ICD-10-CM | POA: Insufficient documentation

## 2012-08-15 DIAGNOSIS — F3289 Other specified depressive episodes: Secondary | ICD-10-CM | POA: Insufficient documentation

## 2012-08-15 DIAGNOSIS — I10 Essential (primary) hypertension: Secondary | ICD-10-CM | POA: Insufficient documentation

## 2012-08-15 DIAGNOSIS — J45909 Unspecified asthma, uncomplicated: Secondary | ICD-10-CM | POA: Insufficient documentation

## 2012-08-15 DIAGNOSIS — Z8709 Personal history of other diseases of the respiratory system: Secondary | ICD-10-CM | POA: Insufficient documentation

## 2012-08-15 DIAGNOSIS — K219 Gastro-esophageal reflux disease without esophagitis: Secondary | ICD-10-CM | POA: Insufficient documentation

## 2012-08-15 MED ORDER — GABAPENTIN 100 MG PO CAPS: 100.0000 mg | ORAL_CAPSULE | Freq: Three times a day (TID) | ORAL | Status: DC

## 2012-08-15 NOTE — ED Notes (Signed)
Pt with history of back pain and [facility] this [facility] at [facility]. No pain relief since surgery, over past week pain has become severe. C/o shooting numb pain from L hip radiating to L foot. Ambulatory, mae

## 2012-08-15 NOTE — ED Notes (Signed)
Pt states back surgery in July and August; pt states tingling in toes started after back surgery and has gotten worse since; pt states she stubbed her toe and could only feel the tingling after not the pain; pt thinks she has some sort of nerve damage; pt is mentating appropriately; pt c/o shortness of breath and states that she has a hx of CHF.

## 2012-08-15 NOTE — ED Provider Notes (Signed)
History    This chart was scribed for Hilario Quarry, MD, MD by Smitty Pluck, ED Scribe. The patient was seen in room TR08C and the patient's care was started at 12:21PM.   CSN: [id number]  Arrival date & time 08/15/12  1135   None     Chief Complaint  Patient presents with  . Back Pain    (Consider location/radiation/quality/duration/timing/severity/associated sxs/prior treatment) Patient is a 41 y.o. female presenting with back pain. The history is provided by the patient. No language interpreter was used.  Back Pain  This is a chronic problem. The current episode started more than 2 days ago. The problem occurs constantly. The pain is associated with no known injury. The pain is present in the lumbar spine. The pain radiates to the left thigh, left knee and left foot. The pain is severe. The symptoms are aggravated by bending, twisting and certain positions. Pertinent negatives include no fever, no dysuria and no weakness.   Lori Bean is a 41 y.o. female with who presents to the Emergency Department complaining of constant, moderate lower back pain radiating down left leg to left foot onset 2 months ago. Pt reports having numbness radiating from hip to left foot. Pt reports falling 2 months ago. She slipped going down hill and fell into seated position.  She went to PCP August 01, 2012 and was told that she would be referred to neurosurgeon but she has not had appointment. Denies incontinence, dysuria, weakness and any other pain. She had surgey on L4-L5 5 months ago. She last saw Dr. Gerlene Fee 2 months ago and was released from his care but states he knew about fall. She has had normal xray. She reports taking 10 mg percocet for pain with minor relief. Pt was prescribed 05/06/12 90 percocets and 60 percocets on 05/17/12 by another physician.      Past Medical History  Diagnosis Date  . Asthma   . Hypertension   . Anxiety   . Snoring disorder     Pt stated my boyfriend always  wakes me up and tells me to breathe  . Heart murmur   . CHF (congestive heart failure)   . Shortness of breath   . Bronchitis     hx of  . Bipolar affective disorder     Schizophrenia  . Depression   . GERD (gastroesophageal reflux disease)   . Arthritis   . Anginal pain   . Overactive bladder   . Schizophrenia     Past Surgical History  Procedure Date  . Rotator cuff repair     Right shoulder  . Eye surgery     Metal plate in right eye  . Tubal ligation   . Gallstones reomved   . Lumbar laminectomy/decompression microdiscectomy 04/01/2012    Procedure: LUMBAR LAMINECTOMY/DECOMPRESSION MICRODISCECTOMY 2 LEVELS;  Surgeon: Reinaldo Meeker, MD;  Location: [facility];  Service: Neurosurgery;  Laterality: Left;  Lumbar four-five, lumbar five sacral one microdiscectomy   . Back surgery   . Lumbar wound debridement 04/29/2012    Procedure: LUMBAR WOUND DEBRIDEMENT;  Surgeon: Reinaldo Meeker, MD;  Location: [facility];  Service: Neurosurgery;  Laterality: N/A;  lumbar wound debridement    History reviewed. No pertinent family history.  History  Substance Use Topics  . Smoking status: Former Smoker -- 0.2 packs/day for 9 years    Types: Cigarettes  . Smokeless tobacco: Never Used  . Alcohol Use: No    OB History  Grav Para Term Preterm Abortions TAB SAB Ect Mult Living                  Review of Systems  Constitutional: Negative for fever and chills.  Respiratory: Negative for shortness of breath.   Gastrointestinal: Negative for nausea and vomiting.  Genitourinary: Negative for dysuria.  Musculoskeletal: Positive for back pain.  Neurological: Negative for weakness.  All other systems reviewed and are negative.    Allergies  Review of patient's allergies indicates no known allergies.  Home Medications   Current Outpatient Rx  Name  Route  Sig  Dispense  Refill  . ALBUTEROL SULFATE HFA 108 (90 BASE) MCG/ACT IN AERS   Inhalation   Inhale 2 puffs into the  lungs every 6 (six) hours as needed. For shortness of breath         . AMLODIPINE BESYLATE 10 MG PO TABS   Oral   Take 10 mg by mouth daily.         . ARIPIPRAZOLE 5 MG PO TABS   Oral   Take 5 mg by mouth daily.         [name] CARISOPRODOL 350 MG PO TABS   Oral   Take 350 mg by mouth 2 (two) times daily. For muscle spasms         . CYCLOBENZAPRINE HCL 10 MG PO TABS   Oral   Take 1 tablet (10 mg total) by mouth 2 (two) times daily as needed for muscle spasms.   20 tablet   0   . DEXLANSOPRAZOLE 60 MG PO CPDR   Oral   Take 60 mg by mouth daily.         [name] DIAZEPAM 5 MG PO TABS   Oral   Take 5 mg by mouth every 6 (six) hours as needed. For spasms         . DICLOFENAC SODIUM 75 MG PO TBEC   Oral   Take 75 mg by mouth 2 (two) times daily.         [name] ESOMEPRAZOLE MAGNESIUM 40 MG PO CPDR   Oral   Take 40 mg by mouth daily before breakfast.         . FAMOTIDINE 20 MG PO TABS   Oral   Take 20 mg by mouth at bedtime as needed. One at bedtime         . FUROSEMIDE 40 MG PO TABS   Oral   Take 40 mg by mouth daily.         [name] HYDROCHLOROTHIAZIDE 12.5 MG PO CAPS   Oral   Take 12.5 mg by mouth daily.         . MELOXICAM 15 MG PO TABS   Oral   Take 15 mg by mouth daily.         . MOMETASONE FURO-FORMOTEROL FUM 200-5 MCG/ACT IN AERO   Inhalation   Inhale 2 puffs into the lungs 2 (two) times daily.          [name] OMEPRAZOLE 20 MG PO CPDR   Oral   Take 20 mg by mouth daily.         . OXYCODONE-ACETAMINOPHEN 10-325 MG PO TABS   Oral   Take 1 tablet by mouth 3 (three) times daily as needed. For pain         . OXYCODONE-ACETAMINOPHEN 5-325 MG PO TABS   Oral   Take 1-2 tablets by mouth every 6 (six) hours as needed for  pain.   15 tablet   0   . SOLIFENACIN SUCCINATE 5 MG PO TABS   Oral   Take 5 mg by mouth daily.           BP 133/95  Pulse 91  Temp 98.3 F (36.8 C) (Oral)  Resp 16  SpO2 100%  Physical Exam  Nursing note and vitals  reviewed. Constitutional: She is oriented to person, place, and time. She appears well-developed and well-nourished. No distress.  HENT:  Head: Normocephalic and atraumatic.  Eyes: EOM are normal.  Neck: Neck supple. No tracheal deviation present.  Cardiovascular: Normal rate.   Pulmonary/Chest: Effort normal. No respiratory distress.  Musculoskeletal: Normal range of motion.       Tenderness to palpation along medial and lateral aspect of left ankle Tenderness of left leg  Scar indicative of back surgeon   Neurological: She is alert and oriented to person, place, and time.  Skin: Skin is warm and dry.  Psychiatric: She has a normal mood and affect. Her behavior is normal.    ED Course  Procedures (including critical care time) DIAGNOSTIC STUDIES: Oxygen Saturation is 100% on room air, normal by my interpretation.    COORDINATION OF CARE: 12:31 PM Discussed ED treatment with pt (presciption for neurontin and chronic back pain information)     Labs Reviewed - No data to display No results found.   No diagnosis found.  I personally performed the services described in this documentation, which was scribed in my presence. The recorded information has been reviewed and considered.   MDM  Chronic pain with narcotic use    Hilario Quarry, MD 08/20/12 [phone]

## 2012-08-15 NOTE — ED Notes (Signed)
Dr Ray at bedside. 

## 2012-08-15 NOTE — ED Notes (Signed)
Pt given d/c teaching and prescriptions. Pt ambulatory leaving ED with paperwork. Pt has no further questions upon d/c.

## 2012-08-22 ENCOUNTER — Other Ambulatory Visit: Payer: Self-pay | Admitting: Neurosurgery

## 2012-08-22 DIAGNOSIS — M5126 Other intervertebral disc displacement, lumbar region: Secondary | ICD-10-CM

## 2012-08-29 ENCOUNTER — Ambulatory Visit
Admission: RE | Admit: 2012-08-29 | Discharge: 2012-08-29 | Disposition: A | Discharge status: Home / Self Care | Payer: Medicaid Other | Source: Ambulatory Visit | Attending: Neurosurgery | Admitting: Neurosurgery

## 2012-08-29 DIAGNOSIS — M5126 Other intervertebral disc displacement, lumbar region: Secondary | ICD-10-CM

## 2012-08-29 MED ORDER — GADOBENATE DIMEGLUMINE 529 MG/ML IV SOLN
20.0000 mL | Freq: Once | INTRAVENOUS | Status: AC | PRN
  Administered 2012-08-29: 20 mL via INTRAVENOUS

## 2012-08-31 ENCOUNTER — Encounter (HOSPITAL_COMMUNITY): Payer: Self-pay | Admitting: Emergency Medicine

## 2012-08-31 ENCOUNTER — Observation Stay (HOSPITAL_COMMUNITY)
Admission: EM | Admit: 2012-08-31 | Discharge: 2012-08-31 | Disposition: A | Discharge status: Home / Self Care | Payer: Medicaid Other | Attending: Emergency Medicine | Admitting: Emergency Medicine

## 2012-08-31 ENCOUNTER — Emergency Department (HOSPITAL_COMMUNITY): Payer: Medicaid Other

## 2012-08-31 DIAGNOSIS — J45901 Unspecified asthma with (acute) exacerbation: Secondary | ICD-10-CM

## 2012-08-31 DIAGNOSIS — R059 Cough, unspecified: Secondary | ICD-10-CM | POA: Insufficient documentation

## 2012-08-31 DIAGNOSIS — R05 Cough: Secondary | ICD-10-CM | POA: Insufficient documentation

## 2012-08-31 DIAGNOSIS — F17200 Nicotine dependence, unspecified, uncomplicated: Secondary | ICD-10-CM | POA: Insufficient documentation

## 2012-08-31 DIAGNOSIS — I10 Essential (primary) hypertension: Secondary | ICD-10-CM | POA: Insufficient documentation

## 2012-08-31 DIAGNOSIS — R062 Wheezing: Secondary | ICD-10-CM

## 2012-08-31 DIAGNOSIS — R0602 Shortness of breath: Principal | ICD-10-CM | POA: Insufficient documentation

## 2012-08-31 DIAGNOSIS — R0789 Other chest pain: Secondary | ICD-10-CM | POA: Insufficient documentation

## 2012-08-31 DIAGNOSIS — M79609 Pain in unspecified limb: Secondary | ICD-10-CM | POA: Insufficient documentation

## 2012-08-31 LAB — COMPREHENSIVE METABOLIC PANEL
ALT: 14 U/L (ref 0–35)
AST: 7 U/L (ref 0–37)
Albumin: 3.5 g/dL (ref 3.5–5.2)
Alkaline Phosphatase: 83 U/L (ref 39–117)
BUN: 14 mg/dL (ref 6–23)
CO2: 26 mEq/L (ref 19–32)
Calcium: 9.4 mg/dL (ref 8.4–10.5)
Chloride: 100 mEq/L (ref 96–112)
Creatinine, Ser: 0.78 mg/dL (ref 0.50–1.10)
GFR calc Af Amer: >90 mL/min (ref >90)
GFR calc non Af Amer: >90 mL/min (ref >90)
Glucose, Bld: 134 mg/dL — ABNORMAL HIGH (ref 70–99)
Potassium: 3.7 mEq/L (ref 3.5–5.1)
Sodium: 137 mEq/L (ref 135–145)
Total Bilirubin: 0.3 mg/dL (ref 0.3–1.2)
Total Protein: 7.4 g/dL (ref 6.0–8.3)

## 2012-08-31 LAB — CBC WITH DIFFERENTIAL/PLATELET
Basophils Absolute: 0 10*3/uL (ref 0.0–0.1)
Basophils Relative: 0 % (ref 0–1)
Eosinophils Absolute: 0.6 10*3/uL (ref 0.0–0.7)
Eosinophils Relative: 4 % (ref 0–5)
HCT: 41 % (ref 36.0–46.0)
Hemoglobin: 13.9 g/dL (ref 12.0–15.0)
Lymphocytes Relative: 35 % (ref 12–46)
Lymphs Abs: 4.5 10*3/uL — ABNORMAL HIGH (ref 0.7–4.0)
MCH: 30.3 pg (ref 26.0–34.0)
MCHC: 33.9 g/dL (ref 30.0–36.0)
MCV: 89.3 fL (ref 78.0–100.0)
Monocytes Absolute: 0.9 10*3/uL (ref 0.1–1.0)
Monocytes Relative: 7 % (ref 3–12)
Neutro Abs: 6.8 10*3/uL (ref 1.7–7.7)
Neutrophils Relative %: 53 % (ref 43–77)
Platelets: 279 10*3/uL (ref 150–400)
RBC: 4.59 MIL/uL (ref 3.87–5.11)
RDW: 13.8 % (ref 11.5–15.5)
WBC: 12.8 10*3/uL — ABNORMAL HIGH (ref 4.0–10.5)

## 2012-08-31 LAB — POCT I-STAT TROPONIN I: Troponin i, poc: 0.01 ng/mL (ref 0.00–0.08)

## 2012-08-31 LAB — PRO B NATRIURETIC PEPTIDE: Pro B Natriuretic peptide (BNP): 24.4 pg/mL (ref 0–125)

## 2012-08-31 MED ORDER — IPRATROPIUM BROMIDE 0.02 % IN SOLN
RESPIRATORY_TRACT | Status: AC
  Administered 2012-08-31: 0.5 mg via RESPIRATORY_TRACT
  Filled 2012-08-31: qty 2.5

## 2012-08-31 MED ORDER — ALBUTEROL (5 MG/ML) CONTINUOUS INHALATION SOLN
INHALATION_SOLUTION | RESPIRATORY_TRACT | Status: AC
  Administered 2012-08-31: 10 mg/h via RESPIRATORY_TRACT
  Filled 2012-08-31: qty 20

## 2012-08-31 MED ORDER — ALBUTEROL SULFATE (5 MG/ML) 0.5% IN NEBU
5.0000 mg | INHALATION_SOLUTION | Freq: Once | RESPIRATORY_TRACT | Status: AC
  Administered 2012-08-31: 5 mg via RESPIRATORY_TRACT

## 2012-08-31 MED ORDER — IPRATROPIUM BROMIDE 0.02 % IN SOLN
0.5000 mg | Freq: Once | RESPIRATORY_TRACT | Status: AC
  Administered 2012-08-31: 0.5 mg via RESPIRATORY_TRACT
  Filled 2012-08-31: qty 2.5

## 2012-08-31 MED ORDER — AZITHROMYCIN 250 MG PO TABS
500.0000 mg | ORAL_TABLET | Freq: Once | ORAL | Status: AC
  Administered 2012-08-31: 500 mg via ORAL
  Filled 2012-08-31: qty 2

## 2012-08-31 MED ORDER — ONDANSETRON HCL 4 MG/2ML IJ SOLN: 4.0000 mg | Freq: Four times a day (QID) | INTRAMUSCULAR | Status: DC | PRN

## 2012-08-31 MED ORDER — PREDNISONE 20 MG PO TABS
60.0000 mg | ORAL_TABLET | Freq: Once | ORAL | Status: AC
  Administered 2012-08-31: 60 mg via ORAL
  Filled 2012-08-31: qty 3

## 2012-08-31 MED ORDER — ALBUTEROL (5 MG/ML) CONTINUOUS INHALATION SOLN
10.0000 mg/h | INHALATION_SOLUTION | Freq: Once | RESPIRATORY_TRACT | Status: AC
  Administered 2012-08-31: 10 mg/h via RESPIRATORY_TRACT

## 2012-08-31 MED ORDER — ALBUTEROL SULFATE (5 MG/ML) 0.5% IN NEBU: 2.5000 mg | INHALATION_SOLUTION | RESPIRATORY_TRACT | Status: DC | PRN

## 2012-08-31 MED ORDER — OXYCODONE-ACETAMINOPHEN 5-325 MG PO TABS
2.0000 | ORAL_TABLET | Freq: Once | ORAL | Status: AC
  Administered 2012-08-31: 2 via ORAL
  Filled 2012-08-31: qty 2

## 2012-08-31 MED ORDER — ALBUTEROL SULFATE (5 MG/ML) 0.5% IN NEBU
INHALATION_SOLUTION | RESPIRATORY_TRACT | Status: AC
  Administered 2012-08-31: 5 mg via RESPIRATORY_TRACT
  Filled 2012-08-31: qty 1

## 2012-08-31 MED ORDER — IPRATROPIUM BROMIDE 0.02 % IN SOLN
0.5000 mg | Freq: Once | RESPIRATORY_TRACT | Status: AC
  Administered 2012-08-31: 0.5 mg via RESPIRATORY_TRACT

## 2012-08-31 MED ORDER — PREDNISONE 20 MG PO TABS: 40.0000 mg | ORAL_TABLET | Freq: Every day | ORAL | Status: DC

## 2012-08-31 MED ORDER — ALBUTEROL SULFATE (5 MG/ML) 0.5% IN NEBU
5.0000 mg | INHALATION_SOLUTION | Freq: Once | RESPIRATORY_TRACT | Status: AC
  Administered 2012-08-31: 5 mg via RESPIRATORY_TRACT
  Filled 2012-08-31: qty 1

## 2012-08-31 MED ORDER — ACETAMINOPHEN 325 MG PO TABS: 650.0000 mg | ORAL_TABLET | ORAL | Status: DC | PRN

## 2012-08-31 MED ORDER — SODIUM CHLORIDE 0.9 % IV SOLN
1000.0000 mL | INTRAVENOUS | Status: DC
  Administered 2012-08-31: 1000 mL via INTRAVENOUS

## 2012-08-31 MED ORDER — ALBUTEROL SULFATE HFA 108 (90 BASE) MCG/ACT IN AERS
2.0000 | INHALATION_SPRAY | RESPIRATORY_TRACT | Status: DC
  Administered 2012-08-31: 2 via RESPIRATORY_TRACT
  Filled 2012-08-31: qty 6.7

## 2012-08-31 MED ORDER — ALBUTEROL SULFATE HFA 108 (90 BASE) MCG/ACT IN AERS: 2.0000 | INHALATION_SPRAY | RESPIRATORY_TRACT | Status: DC | PRN

## 2012-08-31 NOTE — ED Provider Notes (Signed)
Completed one hour negulizer approximately 1 hour ago. Still feels she is wheezing but improved. Moved to CDU for asthma protocol. Mild insp/exp wheezing still heard. O2 sat 100% on RA.   Will continue to observe.  She continues to require nebulizer treatments for wheezing and SOB. Patient care transferred to Ivar Drape for cotinuation of asthma protocol and eventual disposition.  Rodena Medin, PA-C 08/31/12 1525

## 2012-08-31 NOTE — ED Provider Notes (Signed)
History     CSN: [id number]  Arrival date & time 08/31/12  0744   First MD Initiated Contact with Patient 08/31/12 [phone]      Chief Complaint  Patient presents with  . Shortness of Breath    (Consider location/radiation/quality/duration/timing/severity/associated sxs/prior treatment) HPI KEYUNNA COCO is a 41 y.o. female who presents via EMS complaining of shortness of breath. Pt states she has had cough and worsening SOB for last 2 days, worsened over night. States she is having cough, wheezing, chest tightness. States at home albuterol machine is broken. Denies fever, chills, chest pain. Denies URI symptoms. Denies nausea, vomiting. Admits to having some diarrhea. Admits to increased swelling in LE, hx of asthma and CHF. Movement or exertion makes symptoms worse. Nothing making it better.   Past Medical History  Diagnosis Date  . Asthma   . Hypertension   . Anxiety   . Snoring disorder     Pt stated my boyfriend always wakes me up and tells me to breathe  . Heart murmur   . CHF (congestive heart failure)   . Shortness of breath   . Bronchitis     hx of  . Bipolar affective disorder     Schizophrenia  . Depression   . GERD (gastroesophageal reflux disease)   . Arthritis   . Anginal pain   . Overactive bladder   . Schizophrenia     Past Surgical History  Procedure Date  . Rotator cuff repair     Right shoulder  . Eye surgery     Metal plate in right eye  . Tubal ligation   . Gallstones reomved   . Lumbar laminectomy/decompression microdiscectomy 04/01/2012    Procedure: LUMBAR LAMINECTOMY/DECOMPRESSION MICRODISCECTOMY 2 LEVELS;  Surgeon: Reinaldo Meeker, MD;  Location: [facility];  Service: Neurosurgery;  Laterality: Left;  Lumbar four-five, lumbar five sacral one microdiscectomy   . Back surgery   . Lumbar wound debridement 04/29/2012    Procedure: LUMBAR WOUND DEBRIDEMENT;  Surgeon: Reinaldo Meeker, MD;  Location: [facility];  Service: Neurosurgery;   Laterality: N/A;  lumbar wound debridement    No family history on file.  History  Substance Use Topics  . Smoking status: Former Smoker -- 0.2 packs/day for 9 years    Types: Cigarettes  . Smokeless tobacco: Never Used  . Alcohol Use: No    OB History    Grav Para Term Preterm Abortions TAB SAB Ect Mult Living                  Review of Systems  Constitutional: Negative for fever and chills.  HENT: Negative for congestion, sore throat, neck pain and neck stiffness.   Respiratory: Positive for cough, chest tightness, shortness of breath and wheezing.   Cardiovascular: Positive for leg swelling. Negative for chest pain and palpitations.  Gastrointestinal: Positive for diarrhea. Negative for nausea, vomiting and abdominal pain.  Genitourinary: Negative for dysuria and flank pain.  Musculoskeletal: Negative.   Skin: Negative.   Neurological: Negative for weakness, light-headedness, numbness and headaches.  Hematological: Negative.   Psychiatric/Behavioral: Negative.     Allergies  Review of patient's allergies indicates no known allergies.  Home Medications   Current Outpatient Rx  Name  Route  Sig  Dispense  Refill  . ALBUTEROL SULFATE HFA 108 (90 BASE) MCG/ACT IN AERS   Inhalation   Inhale 2 puffs into the lungs every 6 (six) hours as needed. For shortness of breath         .  AMLODIPINE BESYLATE 10 MG PO TABS   Oral   Take 10 mg by mouth daily.         . ARIPIPRAZOLE 5 MG PO TABS   Oral   Take 5 mg by mouth daily.         [name] CARISOPRODOL 350 MG PO TABS   Oral   Take 350 mg by mouth 2 (two) times daily. For muscle spasms         . CYCLOBENZAPRINE HCL 10 MG PO TABS   Oral   Take 1 tablet (10 mg total) by mouth 2 (two) times daily as needed for muscle spasms.   20 tablet   0   . DEXLANSOPRAZOLE 60 MG PO CPDR   Oral   Take 60 mg by mouth daily.         [name] DICLOFENAC SODIUM 75 MG PO TBEC   Oral   Take 75 mg by mouth 2 (two) times daily.          [name] ESOMEPRAZOLE MAGNESIUM 40 MG PO CPDR   Oral   Take 40 mg by mouth daily before breakfast.         . FUROSEMIDE 40 MG PO TABS   Oral   Take 40 mg by mouth daily.         [name] GABAPENTIN 100 MG PO CAPS   Oral   Take 1 capsule (100 mg total) by mouth 3 (three) times daily.   90 capsule   0   . HYDROCHLOROTHIAZIDE 12.5 MG PO CAPS   Oral   Take 12.5 mg by mouth daily.         . MOMETASONE FURO-FORMOTEROL FUM 200-5 MCG/ACT IN AERO   Inhalation   Inhale 2 puffs into the lungs 2 (two) times daily.          . OXYCODONE-ACETAMINOPHEN 10-325 MG PO TABS   Oral   Take 1 tablet by mouth 3 (three) times daily as needed. For pain         . SOLIFENACIN SUCCINATE 5 MG PO TABS   Oral   Take 5 mg by mouth daily.         [name] NEBULIZER MISC   Does not apply   1 each by Does not apply route once. Albuterol, Ipratroprium and SoluMedrol           BP 136/83  Pulse 96  Temp 97.8 F (36.6 C) (Oral)  Resp 22  SpO2 98%  Physical Exam  Nursing note and vitals reviewed. Constitutional: She is oriented to person, place, and time. She appears well-developed and well-nourished. No distress.  HENT:  Head: Normocephalic and atraumatic.  Eyes: Conjunctivae normal are normal.  Neck: Normal range of motion. Neck supple.  Cardiovascular: Normal rate, regular rhythm and normal heart sounds.   Pulmonary/Chest: She is in respiratory distress. She has wheezes. She has no rales.       Inspiratory and expiratory wheezes bilaterally. accessory muscle use. Tachypnea. Speaking in 2 word sentences  Abdominal: Soft. Bowel sounds are normal. She exhibits no distension. There is no tenderness. There is no rebound.  Musculoskeletal:       1+ bilateral LE edema  Neurological: She is alert and oriented to person, place, and time.  Skin: Skin is warm and dry.    ED Course  Procedures (including critical care time)  8:25 AM Pt seen and examined. Pt with shortness of breath onset yesterday. Hx of  asthma, CHF. Pt was found to be  wheezing. Given solumedrol 125mg  IV, albuterol x2. Pt feeling better. In some respiratory distress, using accessory muscles. Respiratory at bed side. Will do continuous neb. Will monitor.    Date: 08/31/2012  Rate: 94  Rhythm: normal sinus rhythm  QRS Axis: normal  Intervals: normal  ST/T Wave abnormalities: nonspecific T wave changes  Conduction Disutrbances:none  Narrative Interpretation: abnormal R wave progression  Old EKG Reviewed: changes noted new flattened/ inverted T waves    Results for orders placed during the hospital encounter of 08/31/12  CBC WITH DIFFERENTIAL      Component Value Range   WBC 12.8 (*) 4.0 - 10.5 K/uL   RBC 4.59  3.87 - 5.11 MIL/uL   Hemoglobin 13.9  12.0 - 15.0 g/dL   HCT 16.1  09.6 - 04.5 %   MCV 89.3  78.0 - 100.0 fL   MCH 30.3  26.0 - 34.0 pg   MCHC 33.9  30.0 - 36.0 g/dL   RDW 40.9  81.1 - 91.4 %   Platelets 279  150 - 400 K/uL   Neutrophils Relative 53  43 - 77 %   Neutro Abs 6.8  1.7 - 7.7 K/uL   Lymphocytes Relative 35  12 - 46 %   Lymphs Abs 4.5 (*) 0.7 - 4.0 K/uL   Monocytes Relative 7  3 - 12 %   Monocytes Absolute 0.9  0.1 - 1.0 K/uL   Eosinophils Relative 4  0 - 5 %   Eosinophils Absolute 0.6  0.0 - 0.7 K/uL   Basophils Relative 0  0 - 1 %   Basophils Absolute 0.0  0.0 - 0.1 K/uL  COMPREHENSIVE METABOLIC PANEL      Component Value Range   Sodium 137  135 - 145 mEq/L   Potassium 3.7  3.5 - 5.1 mEq/L   Chloride 100  96 - 112 mEq/L   CO2 26  19 - 32 mEq/L   Glucose, Bld 134 (*) 70 - 99 mg/dL   BUN 14  6 - 23 mg/dL   Creatinine, Ser 7.82  0.50 - 1.10 mg/dL   Calcium 9.4  8.4 - 95.6 mg/dL   Total Protein 7.4  6.0 - 8.3 g/dL   Albumin 3.5  3.5 - 5.2 g/dL   AST 7  0 - 37 U/L   ALT 14  0 - 35 U/L   Alkaline Phosphatase 83  39 - 117 U/L   Total Bilirubin 0.3  0.3 - 1.2 mg/dL   GFR calc non Af Amer >90  >90 mL/min   GFR calc Af Amer >90  >90 mL/min  PRO B NATRIURETIC PEPTIDE      Component Value  Range   Pro B Natriuretic peptide (BNP) 24.4  0 - 125 pg/mL  POCT I-STAT TROPONIN I      Component Value Range   Troponin i, poc 0.01  0.00 - 0.08 ng/mL   Comment 3              Dg Chest Portable 1 View  08/31/2012  *RADIOLOGY REPORT*  Clinical Data: Shortness of breath  PORTABLE CHEST - 1 VIEW  Comparison: Portable exam at 0829 hrs compared to 05/08/2012  Findings: Borderline enlargement of cardiac silhouette. Mediastinal contours and pulmonary vascularity normal. Mild atelectasis right base. Lungs otherwise clear. No pleural effusion or pneumothorax. No acute osseous findings.  IMPRESSION: Mild right basilar atelectasis.   Original Report Authenticated By: Ulyses Southward, M.D.    CRITICAL CARE Performed by: Lottie Mussel  Total critical care time: 30  Critical care time was exclusive of separately billable procedures and treating other patients.  Critical care was necessary to treat or prevent imminent or life-threatening deterioration.  Critical care was time spent personally by me on the following activities: development of treatment plan with patient and/or surrogate as well as nursing, discussions with consultants, evaluation of patient's response to treatment, examination of patient, obtaining history from patient or surrogate, ordering and performing treatments and interventions, ordering and review of laboratory studies, ordering and review of radiographic studies, pulse oximetry and re-evaluation of patient's condition.   Pt feeling better after continues neb. Pt still wheezing. Coughing. Her VS are now normal. Pt discussed with Dr. Effie Shy and will be placed in CDU on bronchospasm protocol. Pt to be d/c if improved with steroids, zithromax, albuterol. Admit if no improvement.   Filed Vitals:   08/31/12 0930 08/31/12 0945 08/31/12 1209 08/31/12 1234  BP: 142/77 144/84  149/88  Pulse: 98 95  120  Temp:      TempSrc:      Resp: 22 22  28   SpO2: 96% 94% 99% 97%     No  diagnosis found.    MDM          Lottie Mussel, PA 08/31/12 1650  Lottie Mussel, [place] 08/31/12 1651

## 2012-08-31 NOTE — ED Provider Notes (Signed)
Lori Bean is a 41 y.o. female who complains of shortness of breath. She smokes cigarettes, and is around smokers in her home. She ran out of her albuterol inhaler yesterday, and her albuterol nebulizer is broken.  Exam- admission vital signs are normal.   O2 saturation at 0955 hours 93% on room air Lungs with decreased air movement bilaterally, and scattered wheezes. She is tachypneic  Patient is continued to be symptomatic after treatment.  She'll be placed in the CDU, on bronchitis, protocol.   Medical screening examination/treatment/procedure(s) were conducted as a shared visit with non-physician practitioner(s) and myself.  I personally evaluated the patient during the encounter  Flint Melter, MD 09/01/12 [phone]

## 2012-08-31 NOTE — ED Notes (Signed)
C/O SOB, EMS gave albuterol tx x2, atrovent x1, 125mg  Solu-Medrol IV. Hx of CHF, HTN.

## 2012-08-31 NOTE — ED Notes (Signed)
Respiratory called to do post peak flow per PA
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
## 2012-08-31 NOTE — ED Notes (Signed)
Pt. Alert and oriented x4. No respiratory distress noted.  

## 2012-08-31 NOTE — ED Provider Notes (Signed)
Patient with a hx sig for wheezing was placed in CDU on bronchospasm protocol by Kirichenko, PA-C. Patient care resumed from [place], [place] .  Patient is here for wheezing and has received multiple nebulizer treatments.    5:17 PM Patient is still wheezing. She also endorses leg pain, which is chronic. She normally takes Percocet for this pain, and is requesting some now. Peak flow to be completed by respiratory therapy.  PE: Gen: A&O x4 HEENT: PERRL, EOM intact CHEST: RRR, no m/r/g LUNGS: wheezes heard throughout. ABD: BS x 4, ND/NT EXT: No edema, strong peripheral pulses NEURO: Sensation and strength intact bilaterally   7:43 PM Re-evaluated the patient.  Still wheezing.  Sounds unchanged to me.  I am going to give the patient oral prednisone 60mg .  RT is to complete peak flow testing.  Patient states that she feels "unsafe to go home."  I told her that we are going to keep trying for a couple more hours, but if she does not improve, she will likely be admitted.  9:12 PM Patient re-evaluated.  She sounds much better.  No wheezing heard.  I am going to discharge the patient to home with nebulizer, inhaler, and prednisone.  The patient understands and agrees with the plan.  She is stable and ready for discharge.  Roxy Horseman, PA-C 08/31/12 2113

## 2012-09-01 NOTE — ED Provider Notes (Signed)
Medical screening examination/treatment/procedure(s) were performed by non-physician practitioner and as supervising physician I was immediately available for consultation/collaboration.   Charles B. Bernette Mayers, MD 09/01/12 1504

## 2012-09-09 IMAGING — MR MR LUMBAR SPINE W/O CM
4 of 5 series · 16 of 48 positions shown · non-contrast
Comparison: None.

CLINICAL DATA: Low back pain with numbness and pain in the right
leg.

MRI LUMBAR SPINE WITHOUT CONTRAST
TECHNIQUE: Multiplanar and multiecho pulse sequences of the lumbar
spine were obtained without intravenous contrast.

[Series 2: T2 · sagittal · 4.0mm · 0.44mm/px · 6 of 12 slices shown (1 of 2)]
[im 1/12]
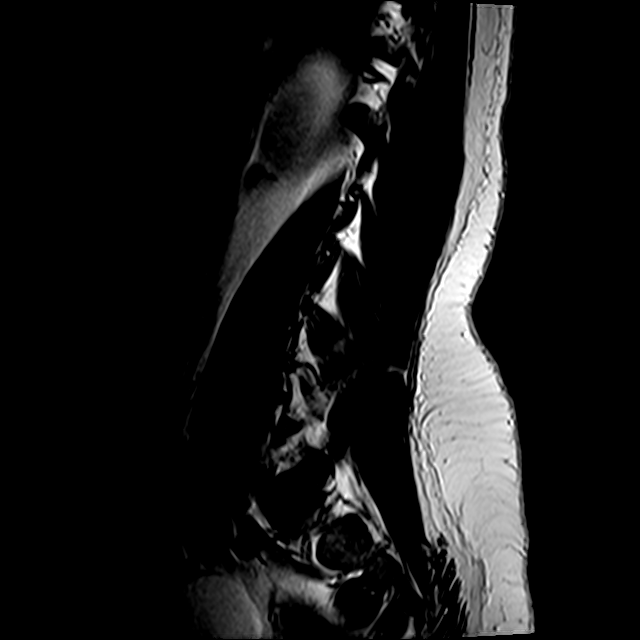
[im 3/12]
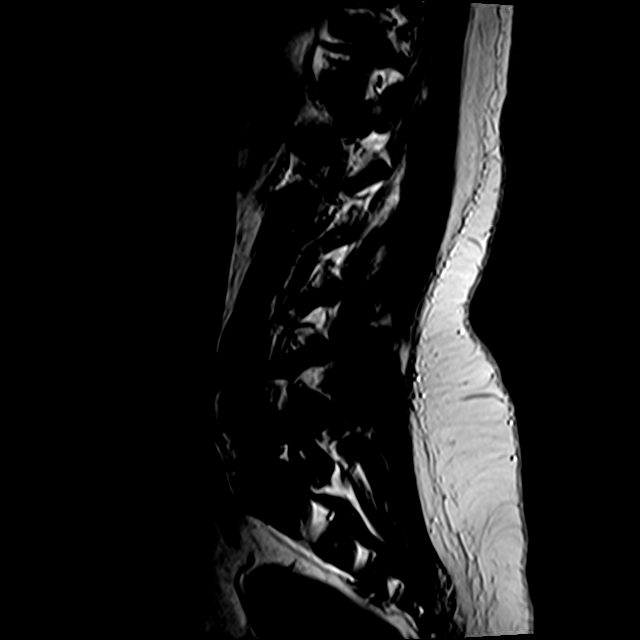
[im 5/12]
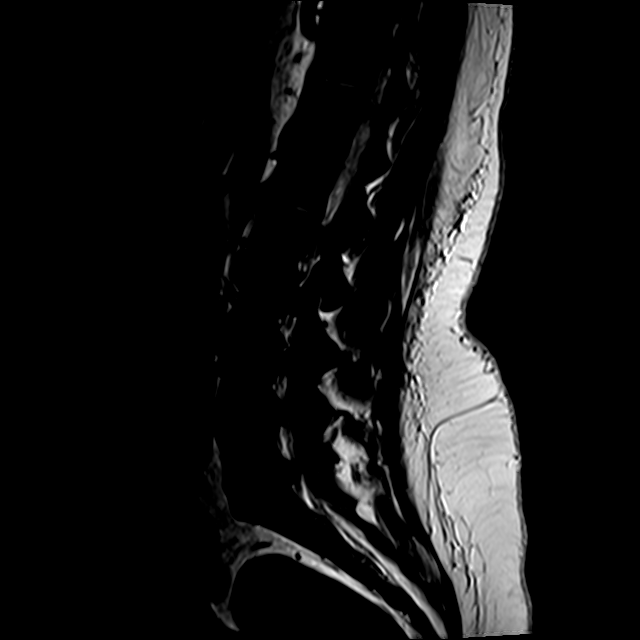
[im 7/12]
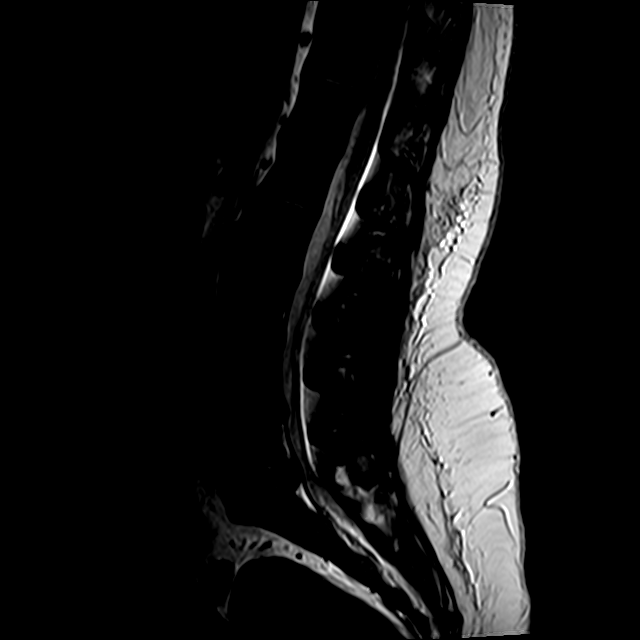
[im 9/12]
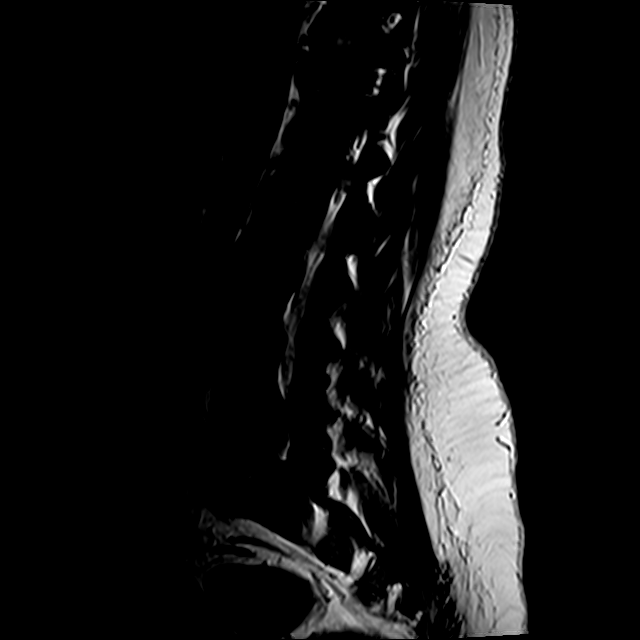
[im 12/12]
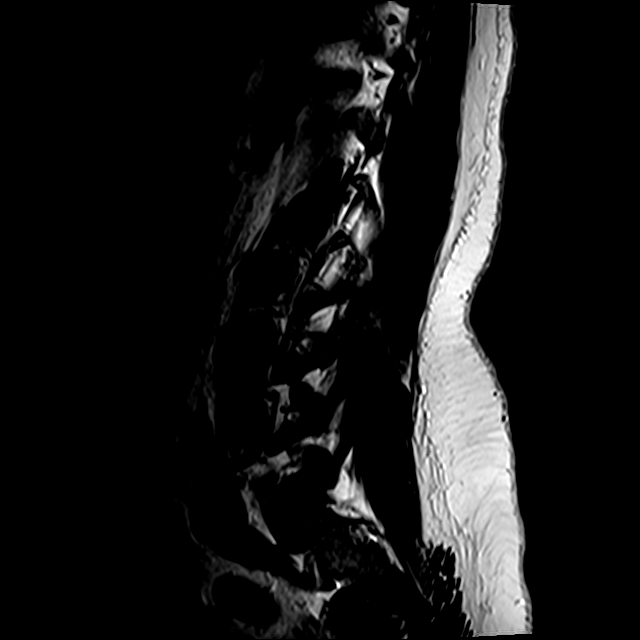

[Series 3: T1 · sagittal · 4.0mm · 0.55mm/px · 3 of 12 slices shown (1 of 2)]
[im 3/12]
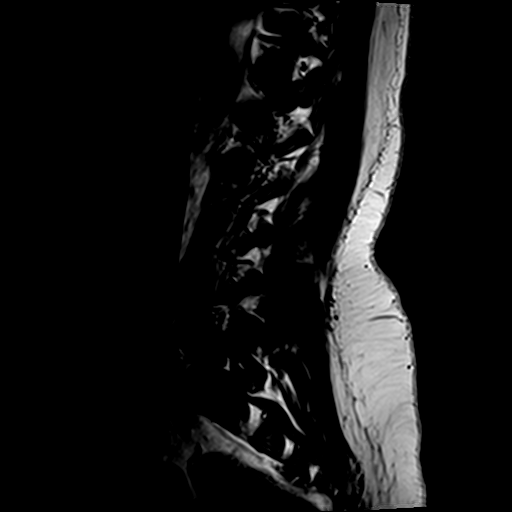
[im 7/12]
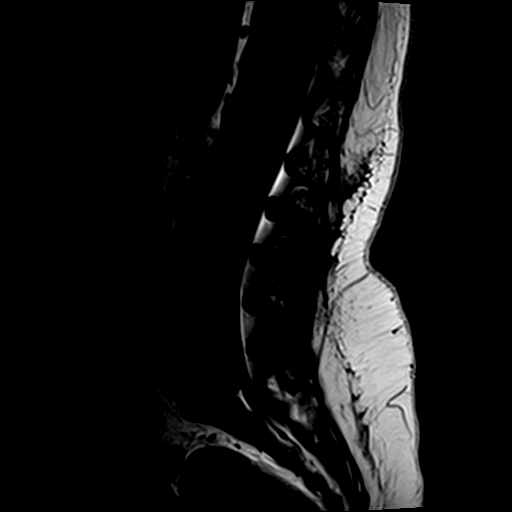
[im 12/12]
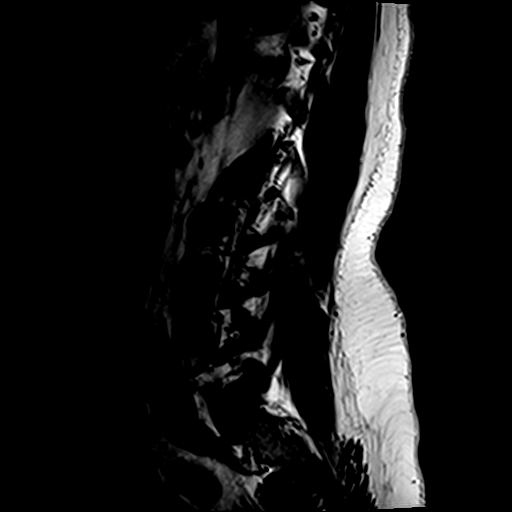

[Series 4: T2 · axial · 4.0mm · 0.39mm/px · z∈[-45,+107]mm · 4 of 30 slices shown (2 of 2)]
[im 1/30]
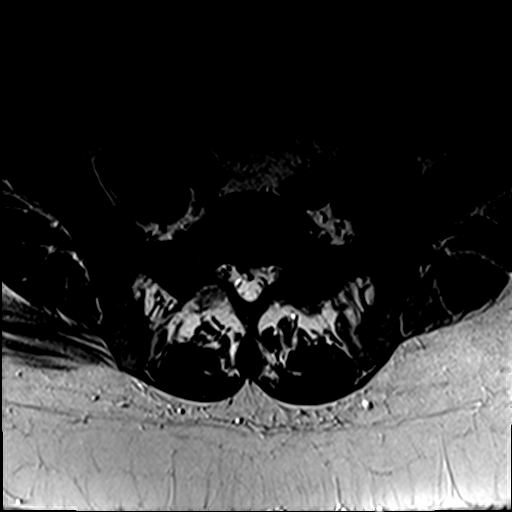
[im 5/30]
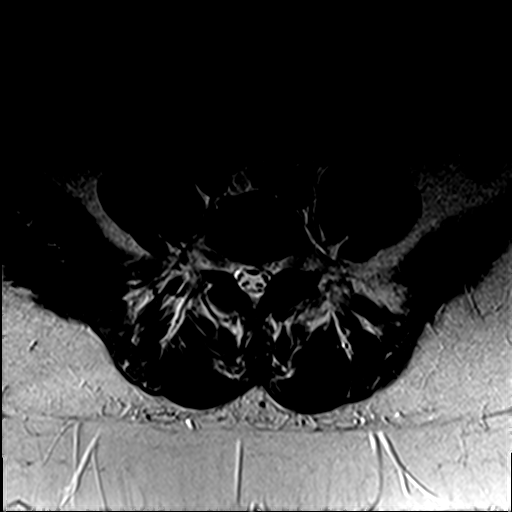
[im 15/30]
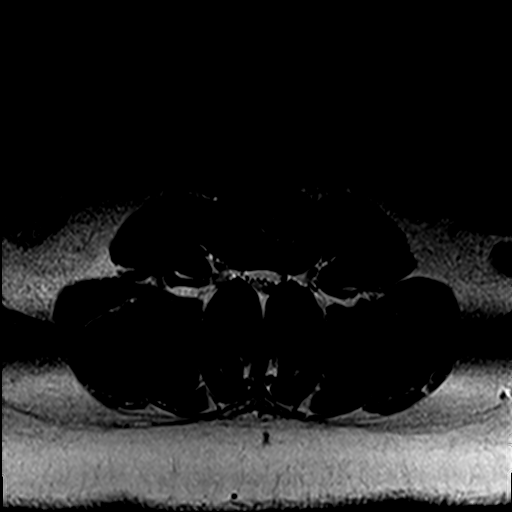
[im 25/30]
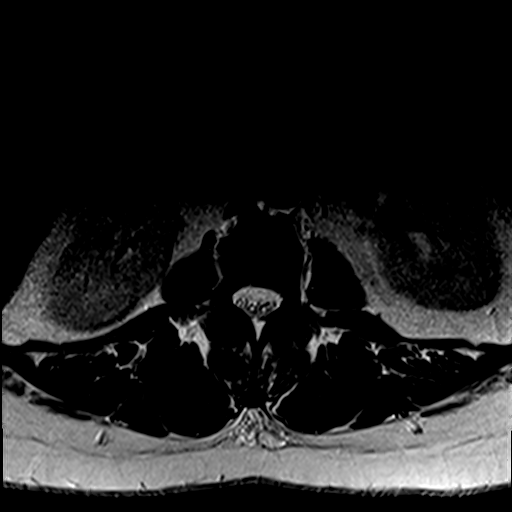

[Series 5: T1 · axial · 4.0mm · 0.39mm/px · z∈[-27,+107]mm · 3 of 30 slices shown (2 of 2)]
[im 5/30]
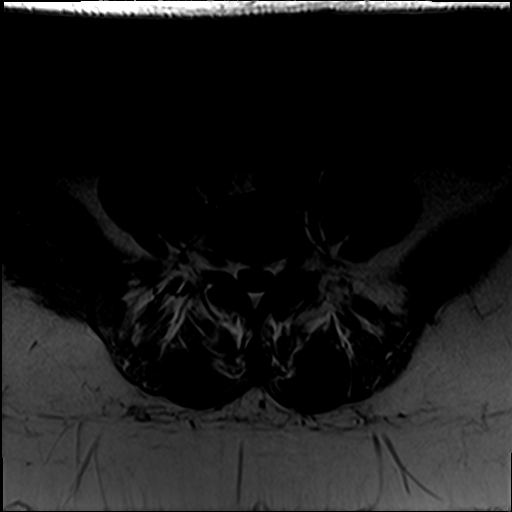
[im 15/30]
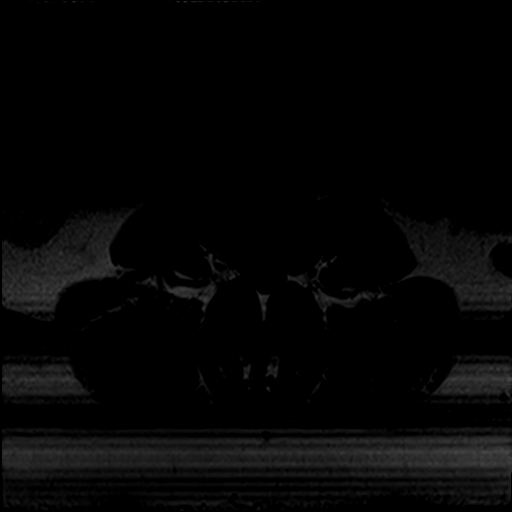
[im 25/30]
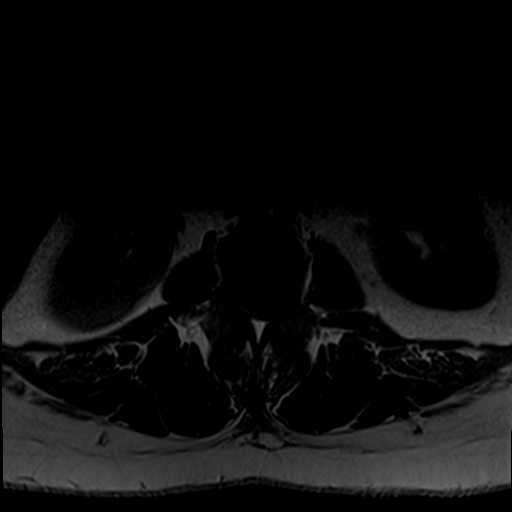

[16 of 48 positions shown; findings below may reference images not displayed]

FINDINGS: Vertebral body height, signal and alignment are
maintained.  The conus medullaris is normal signal and position.
Imaged intra-abdominal contents are unremarkable.

The T10-11, T11-12 and T12-L1 levels are imaged in the sagittal
plane only.  There is disc desiccation and mild bulging at T10-11
eccentric to the left.  Central canal and foramina appear open.
The T11-12 and T12-L1 levels are negative.

L1-2:  Negative.

L2-3:  Negative.

L3-4:  Negative.

L4-5:  The patient has a left paracentral disc protrusion causing
moderate central canal stenosis.  There is marked narrowing in the
left lateral recess and encroachment the descending left L5 root.
Foramina are open.  Right  greater than left facet degenerative
change noted.

L5-S1:  Central disc protrusion results encroachment on both
descending S1 roots.  Central canal is only mildly narrowed. There
is some facet arthropathy at this level.
IMPRESSION: 1.  Central disc protrusion at L5-S1 encroaches on both descending
S1 roots in the lateral recesses.  There is mild central canal
narrowing at this level.
2.  Left paracentral protrusion at L4-5 causes moderate central
canal stenosis and marked narrowing of the left lateral recess with
encroachment on the left L5 root.

## 2013-01-07 ENCOUNTER — Emergency Department (HOSPITAL_COMMUNITY)
Admission: EM | Admit: 2013-01-07 | Discharge: 2013-01-07 | Disposition: A | Discharge status: Home / Self Care | Payer: Medicaid Other | Attending: Emergency Medicine | Admitting: Emergency Medicine

## 2013-01-07 ENCOUNTER — Encounter (HOSPITAL_COMMUNITY): Payer: Self-pay

## 2013-01-07 ENCOUNTER — Emergency Department (HOSPITAL_COMMUNITY): Payer: Medicaid Other

## 2013-01-07 DIAGNOSIS — Z87448 Personal history of other diseases of urinary system: Secondary | ICD-10-CM | POA: Insufficient documentation

## 2013-01-07 DIAGNOSIS — Z87891 Personal history of nicotine dependence: Secondary | ICD-10-CM | POA: Insufficient documentation

## 2013-01-07 DIAGNOSIS — IMO0002 Reserved for concepts with insufficient information to code with codable children: Secondary | ICD-10-CM | POA: Insufficient documentation

## 2013-01-07 DIAGNOSIS — M549 Dorsalgia, unspecified: Secondary | ICD-10-CM

## 2013-01-07 DIAGNOSIS — F209 Schizophrenia, unspecified: Secondary | ICD-10-CM | POA: Insufficient documentation

## 2013-01-07 DIAGNOSIS — S8990XA Unspecified injury of unspecified lower leg, initial encounter: Secondary | ICD-10-CM | POA: Insufficient documentation

## 2013-01-07 DIAGNOSIS — R011 Cardiac murmur, unspecified: Secondary | ICD-10-CM | POA: Insufficient documentation

## 2013-01-07 DIAGNOSIS — Z8739 Personal history of other diseases of the musculoskeletal system and connective tissue: Secondary | ICD-10-CM | POA: Insufficient documentation

## 2013-01-07 DIAGNOSIS — M25562 Pain in left knee: Secondary | ICD-10-CM

## 2013-01-07 DIAGNOSIS — S99929A Unspecified injury of unspecified foot, initial encounter: Secondary | ICD-10-CM | POA: Insufficient documentation

## 2013-01-07 DIAGNOSIS — I10 Essential (primary) hypertension: Secondary | ICD-10-CM | POA: Insufficient documentation

## 2013-01-07 DIAGNOSIS — J45909 Unspecified asthma, uncomplicated: Secondary | ICD-10-CM | POA: Insufficient documentation

## 2013-01-07 DIAGNOSIS — Z79899 Other long term (current) drug therapy: Secondary | ICD-10-CM | POA: Insufficient documentation

## 2013-01-07 DIAGNOSIS — F319 Bipolar disorder, unspecified: Secondary | ICD-10-CM | POA: Insufficient documentation

## 2013-01-07 DIAGNOSIS — Z8679 Personal history of other diseases of the circulatory system: Secondary | ICD-10-CM | POA: Insufficient documentation

## 2013-01-07 DIAGNOSIS — F411 Generalized anxiety disorder: Secondary | ICD-10-CM | POA: Insufficient documentation

## 2013-01-07 DIAGNOSIS — I509 Heart failure, unspecified: Secondary | ICD-10-CM | POA: Insufficient documentation

## 2013-01-07 DIAGNOSIS — K219 Gastro-esophageal reflux disease without esophagitis: Secondary | ICD-10-CM | POA: Insufficient documentation

## 2013-01-07 MED ORDER — OXYCODONE-ACETAMINOPHEN 5-325 MG PO TABS
1.0000 | ORAL_TABLET | Freq: Once | ORAL | Status: AC
  Administered 2013-01-07: 1 via ORAL
  Filled 2013-01-07: qty 1

## 2013-01-07 MED ORDER — IBUPROFEN 600 MG PO TABS: 600.0000 mg | ORAL_TABLET | Freq: Three times a day (TID) | ORAL | Status: DC | PRN

## 2013-01-07 NOTE — ED Notes (Signed)
Discharge instructions reviewed w/ pt., verbalizes understanding. One prescription provided at discharge. 

## 2013-01-07 NOTE — ED Notes (Signed)
Patient transported to X-ray 
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
## 2013-03-09 IMAGING — CR DG CHEST 2V
2 series · 2 of 2 positions shown · non-contrast
Comparison: Chest radiograph [DATE]

CLINICAL DATA: Short of breath

CHEST - 2 VIEW

[w chest pa]
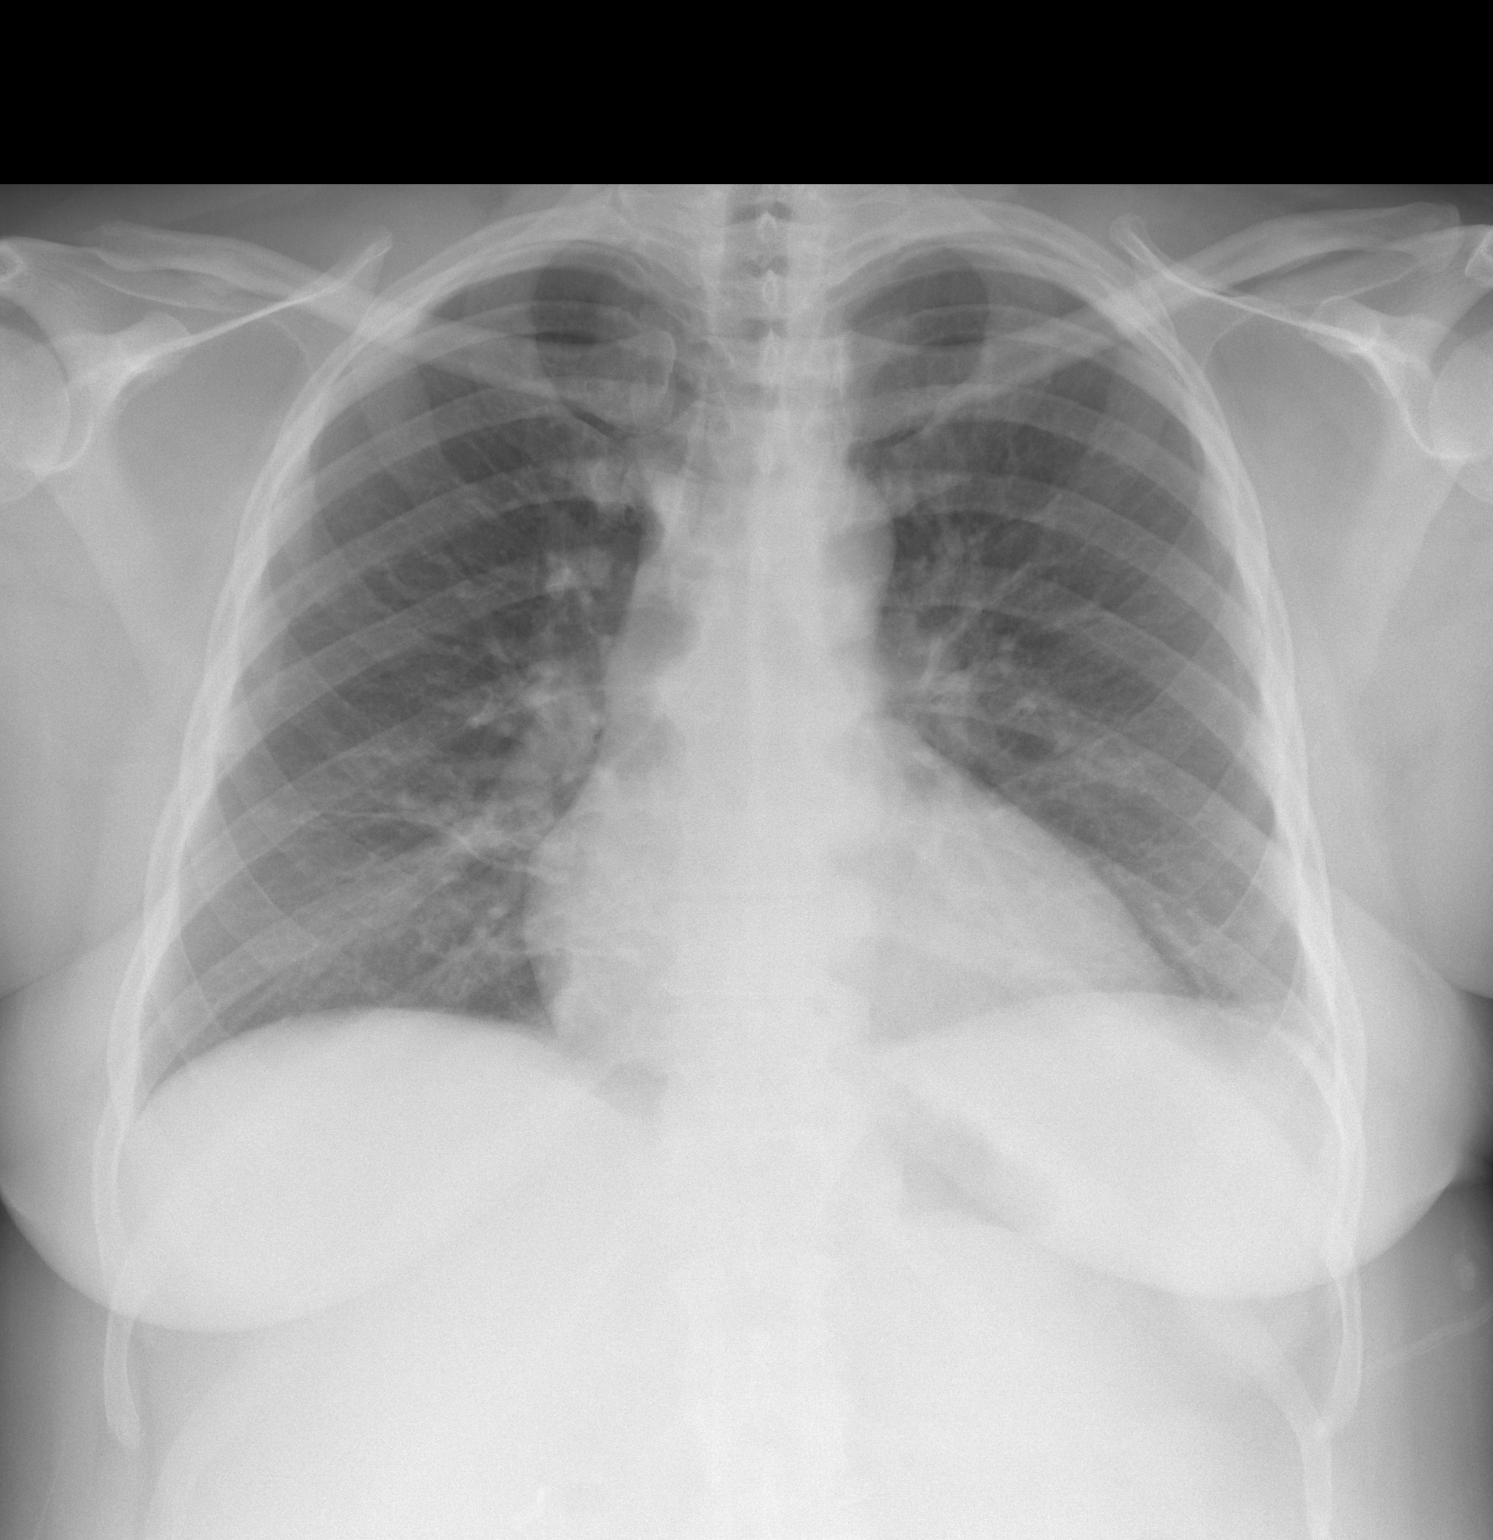

[w chest lat]
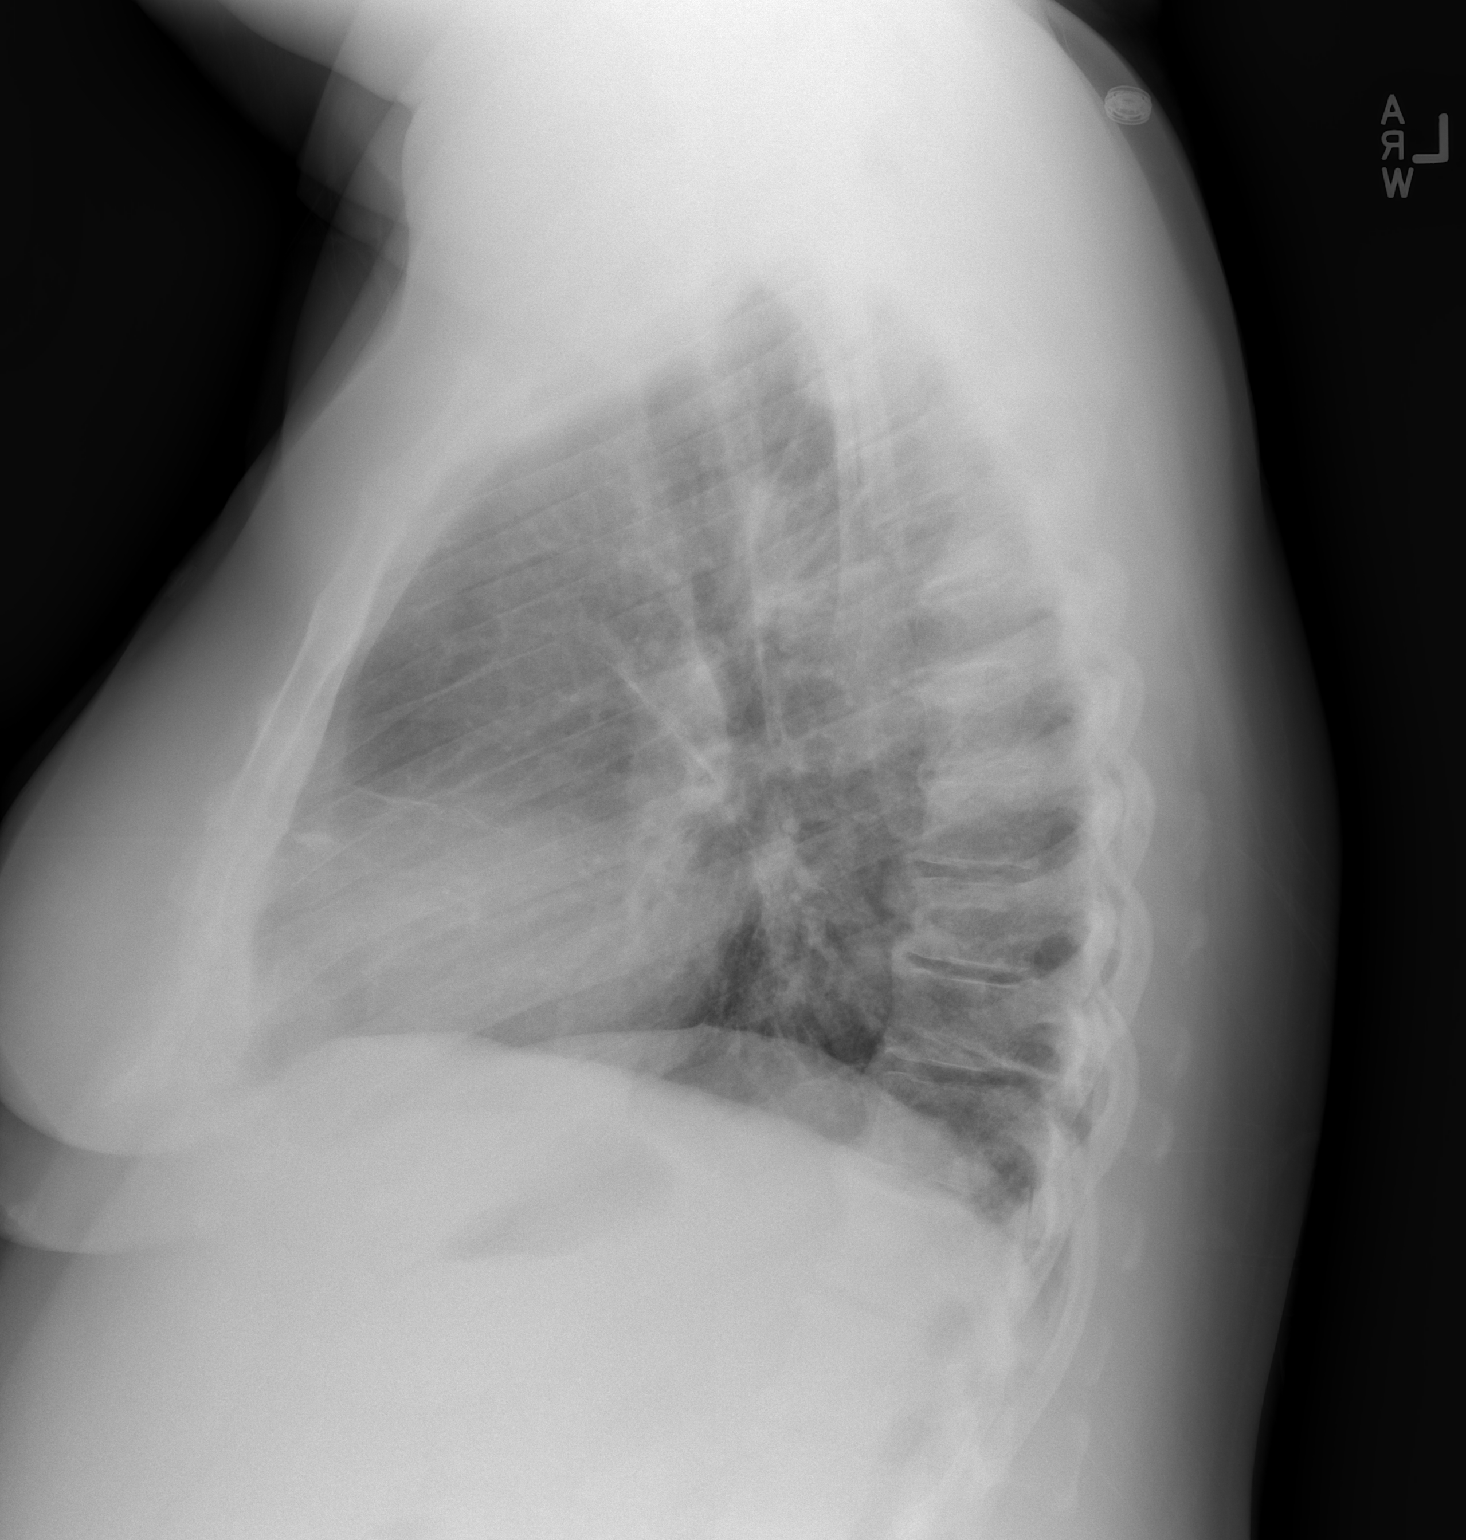

[2 of 2 positions shown; findings below may reference images not displayed]

FINDINGS: Normal mediastinum and heart silhouette.  Costophrenic
angles are clear.  No effusion, infiltrate, or pneumothorax .
Degenerative osteophytosis of the thoracic spine.
IMPRESSION: No acute cardiopulmonary process.

## 2013-04-19 ENCOUNTER — Ambulatory Visit: Payer: Medicaid Other

## 2013-04-22 ENCOUNTER — Encounter (HOSPITAL_COMMUNITY): Payer: Self-pay | Admitting: *Deleted

## 2013-04-22 ENCOUNTER — Emergency Department (HOSPITAL_COMMUNITY)
Admission: EM | Admit: 2013-04-22 | Discharge: 2013-04-22 | Disposition: A | Discharge status: Home / Self Care | Payer: Medicaid Other | Attending: Emergency Medicine | Admitting: Emergency Medicine

## 2013-04-22 ENCOUNTER — Emergency Department (HOSPITAL_COMMUNITY): Payer: Medicaid Other

## 2013-04-22 DIAGNOSIS — J45901 Unspecified asthma with (acute) exacerbation: Secondary | ICD-10-CM | POA: Insufficient documentation

## 2013-04-22 DIAGNOSIS — M7989 Other specified soft tissue disorders: Secondary | ICD-10-CM | POA: Insufficient documentation

## 2013-04-22 DIAGNOSIS — Z79899 Other long term (current) drug therapy: Secondary | ICD-10-CM | POA: Insufficient documentation

## 2013-04-22 DIAGNOSIS — F209 Schizophrenia, unspecified: Secondary | ICD-10-CM | POA: Insufficient documentation

## 2013-04-22 DIAGNOSIS — Z760 Encounter for issue of repeat prescription: Secondary | ICD-10-CM

## 2013-04-22 DIAGNOSIS — I10 Essential (primary) hypertension: Secondary | ICD-10-CM | POA: Insufficient documentation

## 2013-04-22 DIAGNOSIS — Z8709 Personal history of other diseases of the respiratory system: Secondary | ICD-10-CM | POA: Insufficient documentation

## 2013-04-22 DIAGNOSIS — I509 Heart failure, unspecified: Secondary | ICD-10-CM | POA: Insufficient documentation

## 2013-04-22 DIAGNOSIS — R0789 Other chest pain: Secondary | ICD-10-CM | POA: Insufficient documentation

## 2013-04-22 DIAGNOSIS — Z87448 Personal history of other diseases of urinary system: Secondary | ICD-10-CM | POA: Insufficient documentation

## 2013-04-22 DIAGNOSIS — Z87891 Personal history of nicotine dependence: Secondary | ICD-10-CM | POA: Insufficient documentation

## 2013-04-22 DIAGNOSIS — M549 Dorsalgia, unspecified: Secondary | ICD-10-CM

## 2013-04-22 DIAGNOSIS — K219 Gastro-esophageal reflux disease without esophagitis: Secondary | ICD-10-CM | POA: Insufficient documentation

## 2013-04-22 DIAGNOSIS — R0602 Shortness of breath: Secondary | ICD-10-CM | POA: Insufficient documentation

## 2013-04-22 DIAGNOSIS — Z8739 Personal history of other diseases of the musculoskeletal system and connective tissue: Secondary | ICD-10-CM | POA: Insufficient documentation

## 2013-04-22 DIAGNOSIS — M79609 Pain in unspecified limb: Secondary | ICD-10-CM | POA: Insufficient documentation

## 2013-04-22 DIAGNOSIS — Z8679 Personal history of other diseases of the circulatory system: Secondary | ICD-10-CM | POA: Insufficient documentation

## 2013-04-22 DIAGNOSIS — G8929 Other chronic pain: Secondary | ICD-10-CM

## 2013-04-22 DIAGNOSIS — R011 Cardiac murmur, unspecified: Secondary | ICD-10-CM | POA: Insufficient documentation

## 2013-04-22 DIAGNOSIS — Z9889 Other specified postprocedural states: Secondary | ICD-10-CM | POA: Insufficient documentation

## 2013-04-22 DIAGNOSIS — F319 Bipolar disorder, unspecified: Secondary | ICD-10-CM | POA: Insufficient documentation

## 2013-04-22 DIAGNOSIS — F411 Generalized anxiety disorder: Secondary | ICD-10-CM | POA: Insufficient documentation

## 2013-04-22 LAB — CBC WITH DIFFERENTIAL/PLATELET
Basophils Absolute: 0 10*3/uL (ref 0.0–0.1)
Basophils Relative: 0 % (ref 0–1)
Eosinophils Absolute: 0.5 10*3/uL (ref 0.0–0.7)
Eosinophils Relative: 4 % (ref 0–5)
HCT: 37.8 % (ref 36.0–46.0)
Hemoglobin: 13.3 g/dL (ref 12.0–15.0)
Lymphocytes Relative: 24 % (ref 12–46)
Lymphs Abs: 3 10*3/uL (ref 0.7–4.0)
MCH: 32.1 pg (ref 26.0–34.0)
MCHC: 35.2 g/dL (ref 30.0–36.0)
MCV: 91.3 fL (ref 78.0–100.0)
Monocytes Absolute: 1 10*3/uL (ref 0.1–1.0)
Monocytes Relative: 8 % (ref 3–12)
Neutro Abs: 7.7 10*3/uL (ref 1.7–7.7)
Neutrophils Relative %: 64 % (ref 43–77)
Platelets: 285 10*3/uL (ref 150–400)
RBC: 4.14 MIL/uL (ref 3.87–5.11)
RDW: 13.2 % (ref 11.5–15.5)
WBC: 12.1 10*3/uL — ABNORMAL HIGH (ref 4.0–10.5)

## 2013-04-22 LAB — COMPREHENSIVE METABOLIC PANEL
ALT: 14 U/L (ref 0–35)
AST: 8 U/L (ref 0–37)
Albumin: 3.4 g/dL — ABNORMAL LOW (ref 3.5–5.2)
Alkaline Phosphatase: 78 U/L (ref 39–117)
BUN: 12 mg/dL (ref 6–23)
CO2: 25 mEq/L (ref 19–32)
Calcium: 9.6 mg/dL (ref 8.4–10.5)
Chloride: 104 mEq/L (ref 96–112)
Creatinine, Ser: 0.95 mg/dL (ref 0.50–1.10)
GFR calc Af Amer: 84 mL/min — ABNORMAL LOW (ref >90)
GFR calc non Af Amer: 72 mL/min — ABNORMAL LOW (ref >90)
Glucose, Bld: 131 mg/dL — ABNORMAL HIGH (ref 70–99)
Potassium: 3.7 mEq/L (ref 3.5–5.1)
Sodium: 138 mEq/L (ref 135–145)
Total Bilirubin: 0.2 mg/dL — ABNORMAL LOW (ref 0.3–1.2)
Total Protein: 7.1 g/dL (ref 6.0–8.3)

## 2013-04-22 LAB — POCT I-STAT TROPONIN I: Troponin i, poc: 0 ng/mL (ref 0.00–0.08)

## 2013-04-22 LAB — PRO B NATRIURETIC PEPTIDE: Pro B Natriuretic peptide (BNP): 86.4 pg/mL (ref 0–125)

## 2013-04-22 MED ORDER — SOLIFENACIN SUCCINATE 5 MG PO TABS: 5.0000 mg | ORAL_TABLET | Freq: Every day | ORAL | Status: DC

## 2013-04-22 MED ORDER — GABAPENTIN 300 MG PO CAPS: 300.0000 mg | ORAL_CAPSULE | Freq: Two times a day (BID) | ORAL | Status: DC

## 2013-04-22 MED ORDER — AMLODIPINE BESYLATE 10 MG PO TABS: 10.0000 mg | ORAL_TABLET | Freq: Every day | ORAL | Status: DC

## 2013-04-22 MED ORDER — OXYCODONE-ACETAMINOPHEN 5-325 MG PO TABS
2.0000 | ORAL_TABLET | Freq: Once | ORAL | Status: AC
  Administered 2013-04-22: 2 via ORAL
  Filled 2013-04-22: qty 2

## 2013-04-22 MED ORDER — ALBUTEROL SULFATE HFA 108 (90 BASE) MCG/ACT IN AERS
2.0000 | INHALATION_SPRAY | RESPIRATORY_TRACT | Status: DC | PRN
  Administered 2013-04-22: 2 via RESPIRATORY_TRACT
  Filled 2013-04-22: qty 6.7

## 2013-04-22 MED ORDER — HYDROMORPHONE HCL PF 1 MG/ML IJ SOLN
1.0000 mg | INTRAMUSCULAR | Status: AC
  Administered 2013-04-22: 1 mg via INTRAMUSCULAR
  Filled 2013-04-22: qty 1

## 2013-04-22 MED ORDER — FUROSEMIDE 40 MG PO TABS: 40.0000 mg | ORAL_TABLET | Freq: Every day | ORAL | Status: DC

## 2013-04-22 MED ORDER — OXYCODONE-ACETAMINOPHEN 10-325 MG PO TABS: 1.0000 | ORAL_TABLET | Freq: Three times a day (TID) | ORAL | Status: DC | PRN

## 2013-04-22 MED ORDER — ARIPIPRAZOLE 5 MG PO TABS: 5.0000 mg | ORAL_TABLET | Freq: Every day | ORAL | Status: DC

## 2013-04-22 MED ORDER — ESOMEPRAZOLE MAGNESIUM 40 MG PO CPDR: 40.0000 mg | DELAYED_RELEASE_CAPSULE | Freq: Every day | ORAL | Status: DC

## 2013-04-22 NOTE — ED Provider Notes (Signed)
CSN: [id number]     Arrival date & time 04/22/13  0026 History     First MD Initiated Contact with Patient 04/22/13 0046     Chief Complaint  Patient presents with  . multiple complaints    HPI Lori Bean is a 42 y.o. female presents with multiple complaints. Patient says her legs are hurting and that there swelling, he's not taken her Lasix and some time, but she's out of this medicine. He says her primary care physician has moved locations and she cannot get transportation to his office.  Denies any history of venous thromboembolic disease, no unilateral swelling, redness, pain has not changed-she says it's her chronic leg pain. Patient also complaining about chronic chest pain, sharp, intermittent, across the front of her chest. This is been associated with occasional shortness of breath, does have a history of CHF, and asthma, she has run out of her inhaler as well.   Past Medical History  Diagnosis Date  . Asthma   . Hypertension   . Anxiety   . Snoring disorder     Pt stated my boyfriend always wakes me up and tells me to breathe  . Heart murmur   . CHF (congestive heart failure)   . Shortness of breath   . Bronchitis     hx of  . Bipolar affective disorder     Schizophrenia  . Depression   . GERD (gastroesophageal reflux disease)   . Arthritis   . Anginal pain   . Overactive bladder   . Schizophrenia    Past Surgical History  Procedure Laterality Date  . Rotator cuff repair      Right shoulder  . Eye surgery      Metal plate in right eye  . Tubal ligation    . Gallstones reomved    . Lumbar laminectomy/decompression microdiscectomy  04/01/2012    Procedure: LUMBAR LAMINECTOMY/DECOMPRESSION MICRODISCECTOMY 2 LEVELS;  Surgeon: Reinaldo Meeker, MD;  Location: [facility];  Service: Neurosurgery;  Laterality: Left;  Lumbar four-five, lumbar five sacral one microdiscectomy   . Back surgery    . Lumbar wound debridement  04/29/2012    Procedure: LUMBAR WOUND  DEBRIDEMENT;  Surgeon: Reinaldo Meeker, MD;  Location: [facility];  Service: Neurosurgery;  Laterality: N/A;  lumbar wound debridement   No family history on file. History  Substance Use Topics  . Smoking status: Former Smoker -- 0.25 packs/day for 9 years    Types: Cigarettes  . Smokeless tobacco: Never Used  . Alcohol Use: Yes     Comment: One 40 oz. beer/day average--some tequilla   OB History   Grav Para Term Preterm Abortions TAB SAB Ect Mult Living                 Review of Systems At least 10pt or greater review of systems completed and are negative except where specified in the HPI.   Allergies  Review of patient's allergies indicates no known allergies.  Home Medications   Current Outpatient Rx  Name  Route  Sig  Dispense  Refill  . albuterol (PROVENTIL HFA;VENTOLIN HFA) 108 (90 BASE) MCG/ACT inhaler   Inhalation   Inhale 2 puffs into the lungs every 6 (six) hours as needed. For shortness of breath         . amLODipine (NORVASC) 10 MG tablet   Oral   Take 1 tablet (10 mg total) by mouth daily.   30 tablet   0   .  ARIPiprazole (ABILIFY) 5 MG tablet   Oral   Take 1 tablet (5 mg total) by mouth daily.   30 tablet   0   . esomeprazole (NEXIUM) 40 MG capsule   Oral   Take 1 capsule (40 mg total) by mouth daily before breakfast.   30 capsule   0   . furosemide (LASIX) 40 MG tablet   Oral   Take 1 tablet (40 mg total) by mouth daily.   30 tablet   0   . gabapentin (NEURONTIN) 300 MG capsule   Oral   Take 1 capsule (300 mg total) by mouth 2 (two) times daily.   60 capsule   0   . oxyCODONE-acetaminophen (PERCOCET) 10-325 MG per tablet   Oral   Take 1 tablet by mouth 3 (three) times daily as needed for pain. For pain   23 tablet   0   . solifenacin (VESICARE) 5 MG tablet   Oral   Take 1 tablet (5 mg total) by mouth daily.   30 tablet   0    BP 159/98  Pulse 97  Temp(Src) 98.1 F (36.7 C) (Oral)  Resp 20  SpO2 97%  LMP  04/10/2013 Physical Exam  Nursing notes reviewed.  Electronic medical record reviewed. VITAL SIGNS:   Filed Vitals:   04/22/13 0030  BP: 159/98  Pulse: 97  Temp: 98.1 F (36.7 C)  TempSrc: Oral  Resp: 20  SpO2: 97%   CONSTITUTIONAL: Awake, oriented, appears non-toxic HENT: Atraumatic, normocephalic, oral mucosa pink and moist, airway patent. Nares patent without drainage. External ears normal. EYES: Conjunctiva clear, EOMI, PERRLA NECK: Trachea midline, non-tender, supple CARDIOVASCULAR: Normal heart rate, Normal rhythm, No murmurs, rubs, gallops PULMONARY/CHEST: Clear to auscultation, no rhonchi, wheezes, or rales. Symmetrical breath sounds. Non-tender. ABDOMINAL: Non-distended, soft, non-tender - no rebound or guarding.  BS normal. NEUROLOGIC: Non-focal, moving all four extremities, no gross sensory or motor deficits. EXTREMITIES: No clubbing, cyanosis, or edema SKIN: Warm, Dry, No erythema, No rash  ED Course   Procedures (including critical care time)  Date: 04/22/2013  Rate: 100  Rhythm: normal sinus rhythm  QRS Axis: normal  Intervals: normal  ST/T Wave abnormalities: normal  Conduction Disutrbances: none  Narrative Interpretation: unremarkable EKG,     Labs Reviewed  CBC WITH DIFFERENTIAL - Abnormal; Notable for the following:    WBC 12.1 (*)    All other components within normal limits  COMPREHENSIVE METABOLIC PANEL - Abnormal; Notable for the following:    Glucose, Bld 131 (*)    Albumin 3.4 (*)    Total Bilirubin 0.2 (*)    GFR calc non Af Amer 72 (*)    GFR calc Af Amer 84 (*)    All other components within normal limits  PRO B NATRIURETIC PEPTIDE  POCT I-STAT TROPONIN I   Dg Chest 2 View  04/22/2013   *RADIOLOGY REPORT*  Clinical Data: Shortness of breath and back pain.  CHEST - 2 VIEW  Comparison: Chest radiograph performed 08/31/2012, and CTA of the chest performed 05/08/2012  Findings: The lungs are well-aerated.  Minimal bilateral atelectasis is  noted.  There is no evidence of pleural effusion or pneumothorax.  The heart is normal in size; the mediastinal contour is within normal limits.  No acute osseous abnormalities are seen.  Clips are noted within the right upper quadrant, reflecting prior cholecystectomy.  IMPRESSION: Minimal bilateral atelectasis noted; lungs otherwise clear.   Original Report Authenticated By: Tonia Ghent, M.D.  1. Chronic pain   2. Back pain   3. Leg pain, unspecified laterality   4. Prescription refill     MDM  Patient presents with chronic pain and request for medication refill since she has not seen her primary care provider since he moved.  It is reasonable to provide the patient with her prescriptions, discharged in stable and good condition, do not think any further workup is indicated, labs are unremarkable, chest x-ray is unremarkable, EKG is a rate of 100 - and unremarkable.  Doubt emergent causes of CP given her description of chronic pain - differential diagnosis of chest pain includes: Acute coronary syndrome, pericarditis, aortic dissection, pulmonary embolism, tension pneumothorax, pneumonia, and esophageal rupture.  I explained the diagnosis and have given explicit precautions to return to the ER including any other new or worsening symptoms. The patient understands and accepts the medical plan as it's been dictated and I have answered their questions. Discharge instructions concerning home care and prescriptions have been given.  The patient is STABLE and is discharged to home in good condition.   Jones Skene, MD 04/22/13 [phone]

## 2013-04-22 NOTE — ED Notes (Signed)
The pt is c/o back neck pain with feet and ankle swelling for one week with a headache

## 2013-04-23 IMAGING — CT CT HEAD W/O CM
1 series · 16 of 30 positions shown, 20 images · non-contrast
Comparison: 11/30/2009

CLINICAL DATA: Diffuse headache, visual disturbance, left-sided
weakness

CT HEAD WITHOUT CONTRAST
TECHNIQUE: Contiguous axial images were obtained from the base of
the skull through the vertex without contrast.

[Series 2: head routine 4.8 h37s · axial · 0.46mm/px · z∈[-120,+13]mm · 16 of 30 slices shown, 20 images]
[im 2/30  brain]
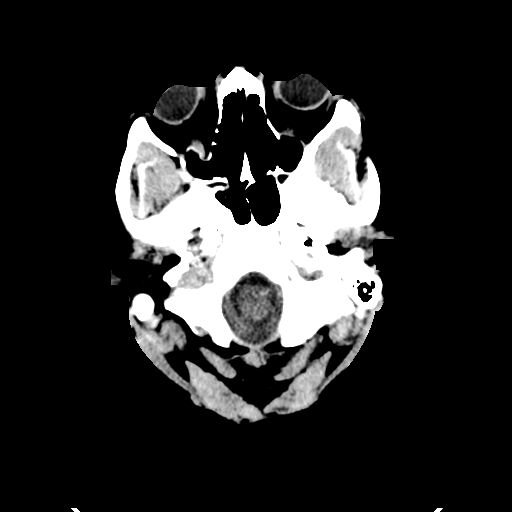
[im 2/30  bone]
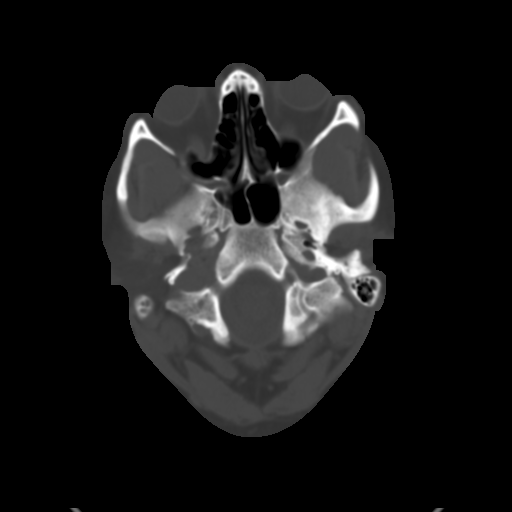
[im 4/30  brain]
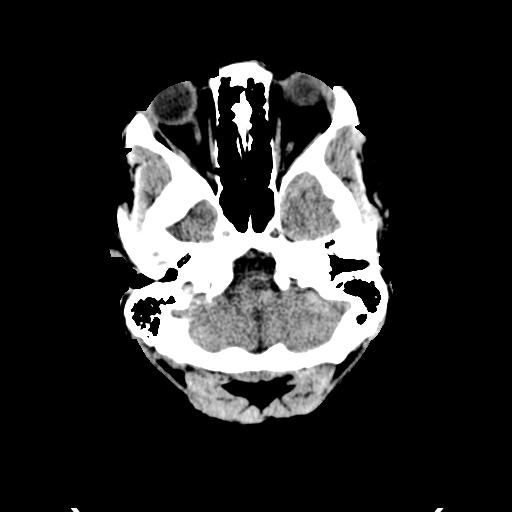
[im 6/30  brain]
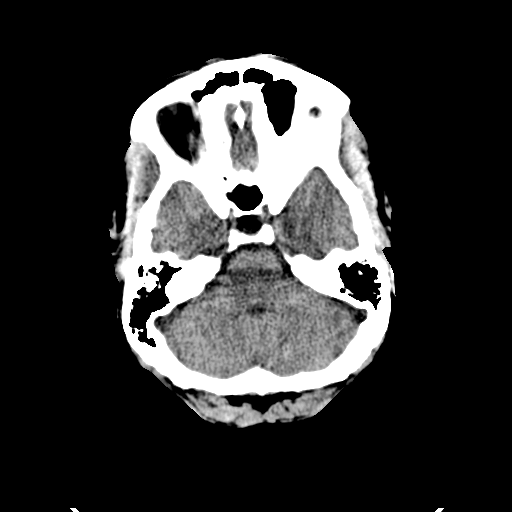
[im 8/30  brain]
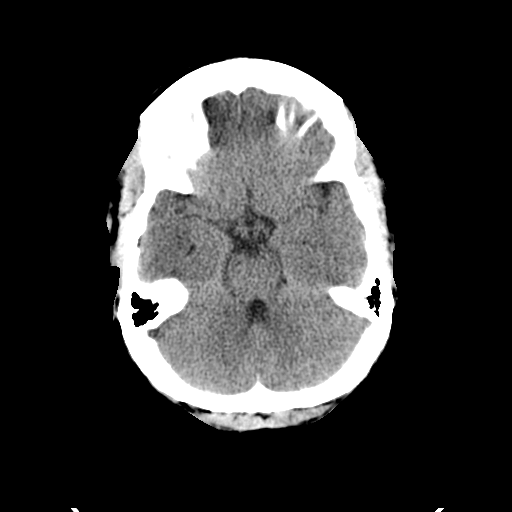
[im 9/30  brain]
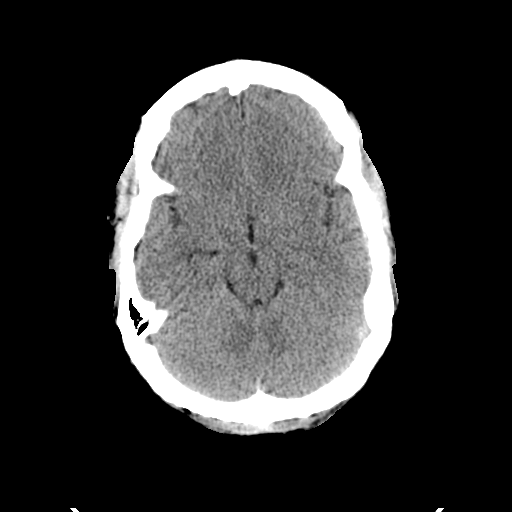
[im 9/30  bone]
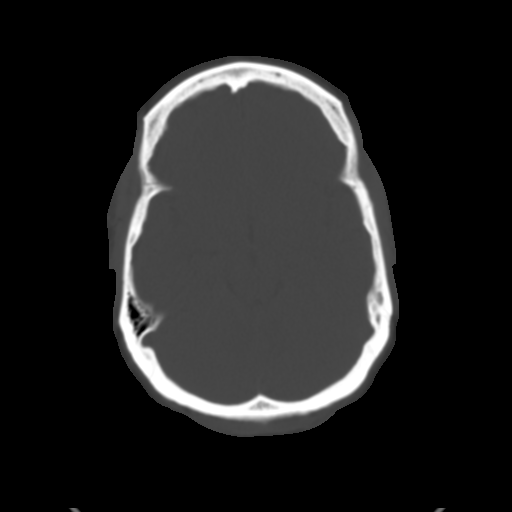
[im 11/30  brain]
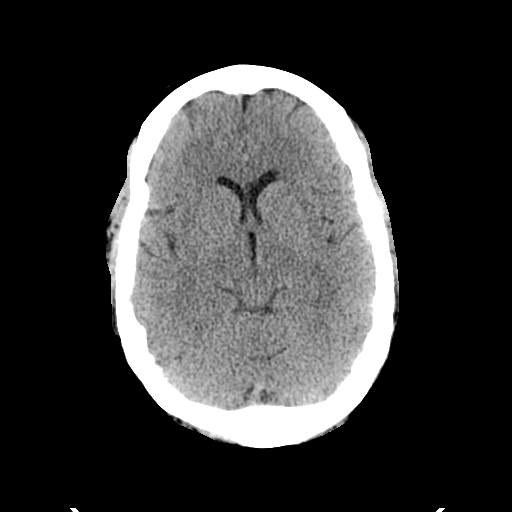
[im 13/30  brain]
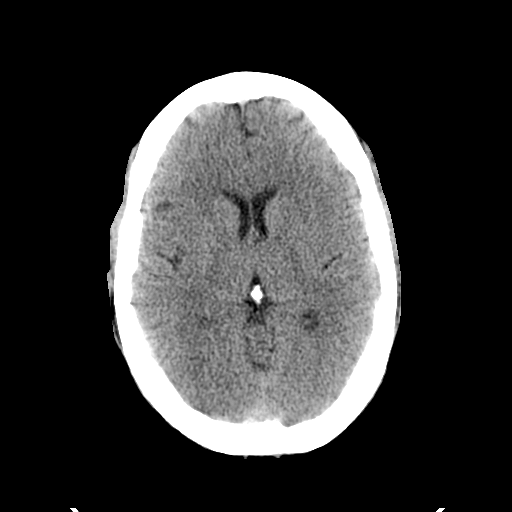
[im 15/30  brain]
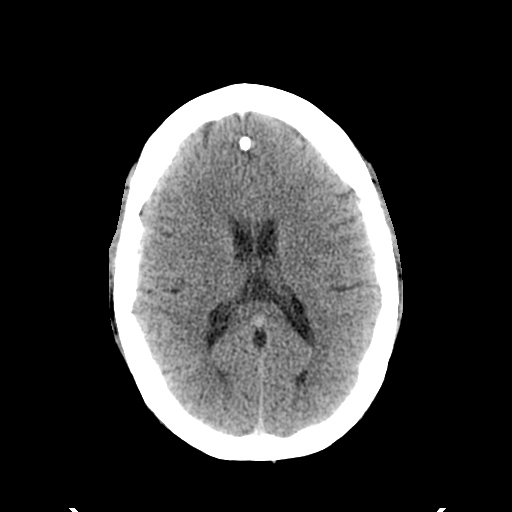
[im 16/30  brain]
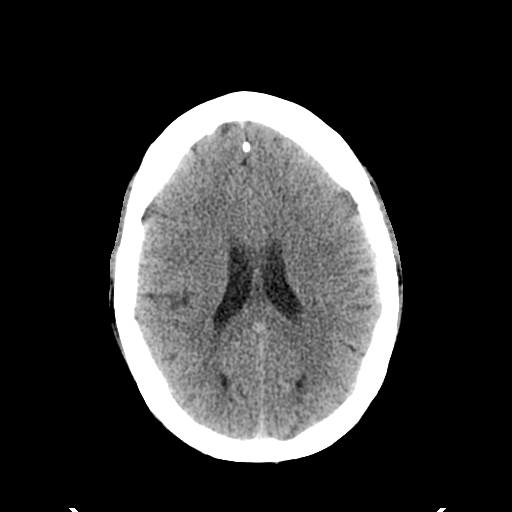
[im 16/30  bone]
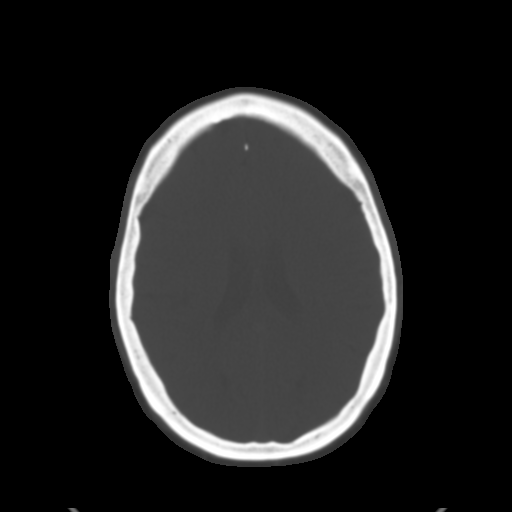
[im 18/30  brain]
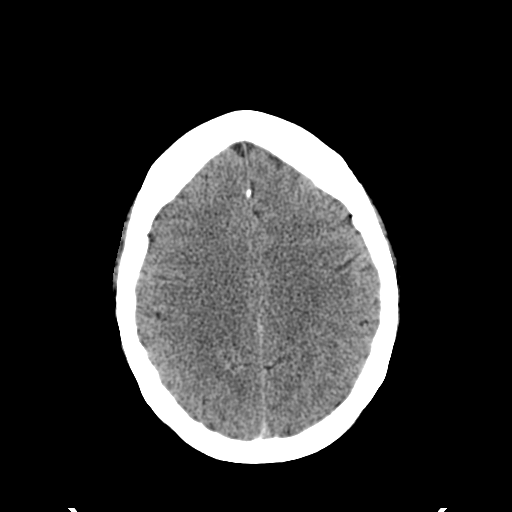
[im 20/30  brain]
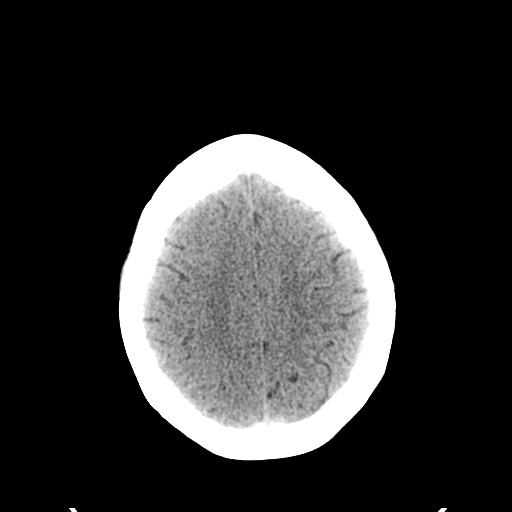
[im 22/30  brain]
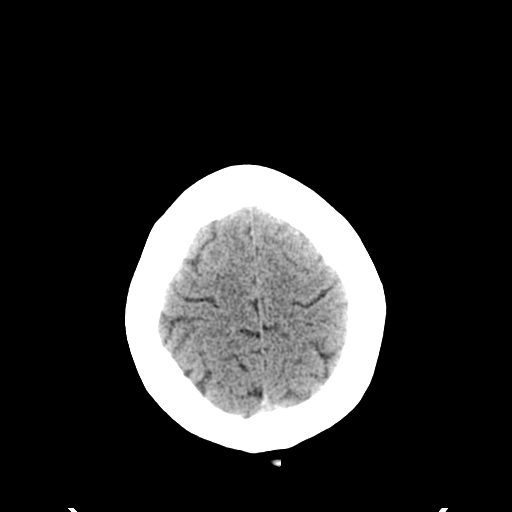
[im 23/30  brain]
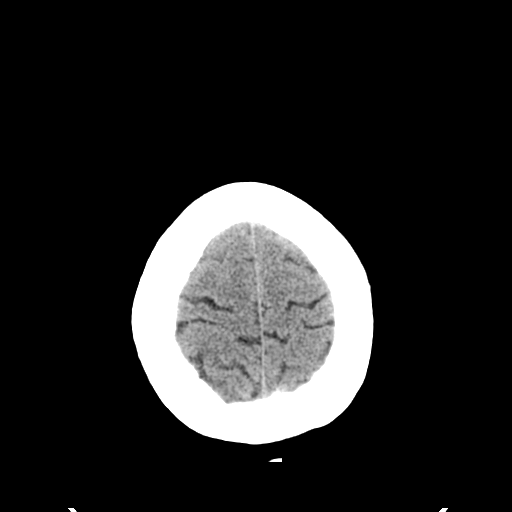
[im 23/30  bone]
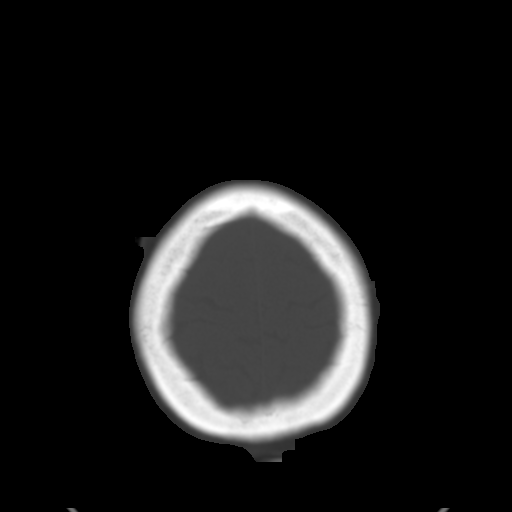
[im 25/30  brain]
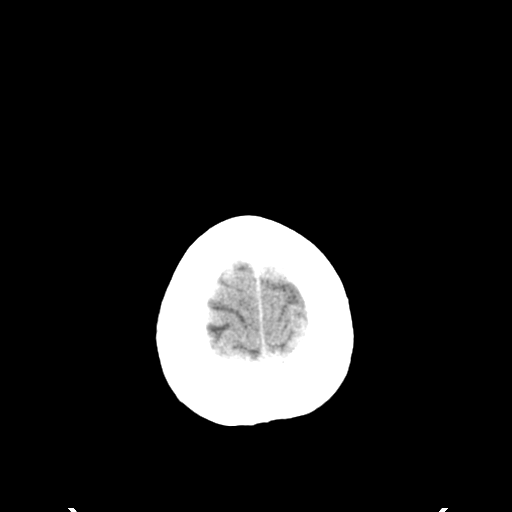
[im 27/30  brain]
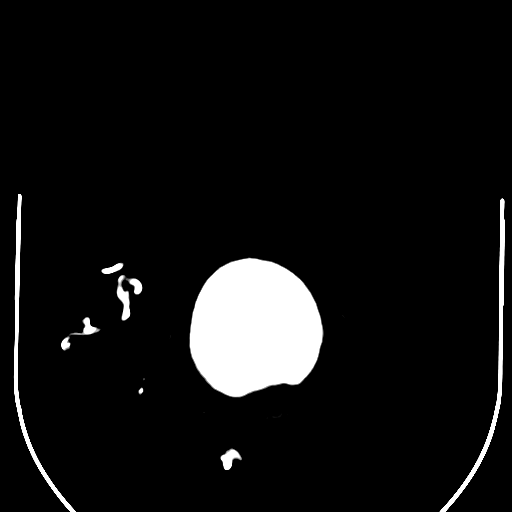
[im 29/30  brain]
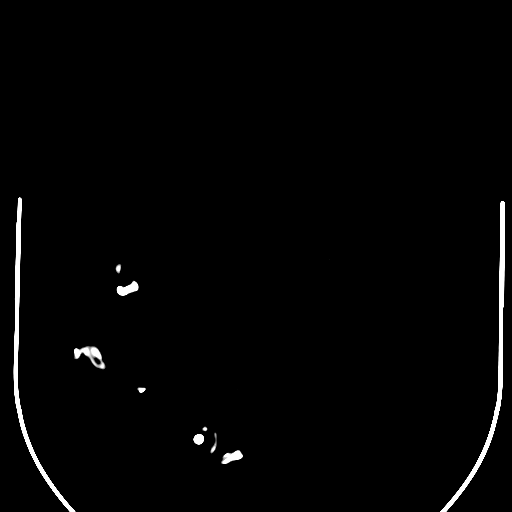

[16 of 30 positions shown; findings below may reference images not displayed]

FINDINGS: No evidence of parenchymal hemorrhage or extra-axial
fluid collection. No mass lesion, mass effect, or midline shift.

No CT evidence of acute infarction.

Cerebral volume is age appropriate.  No ventriculomegaly.

The visualized paranasal sinuses are essentially clear. The mastoid
air cells are unopacified.

No evidence of calvarial fracture.
IMPRESSION: No evidence of acute intracranial abnormality.

## 2013-05-04 ENCOUNTER — Ambulatory Visit: Payer: Medicaid Other

## 2013-05-18 ENCOUNTER — Ambulatory Visit: Payer: Medicaid Other | Admitting: Internal Medicine

## 2013-05-20 IMAGING — CR DG CHEST 2V
2 series · 2 of 2 positions shown · non-contrast
Comparison: 02/03/2011

CLINICAL DATA: Shortness of breath, cough

CHEST - 2 VIEW

[w chest pa]
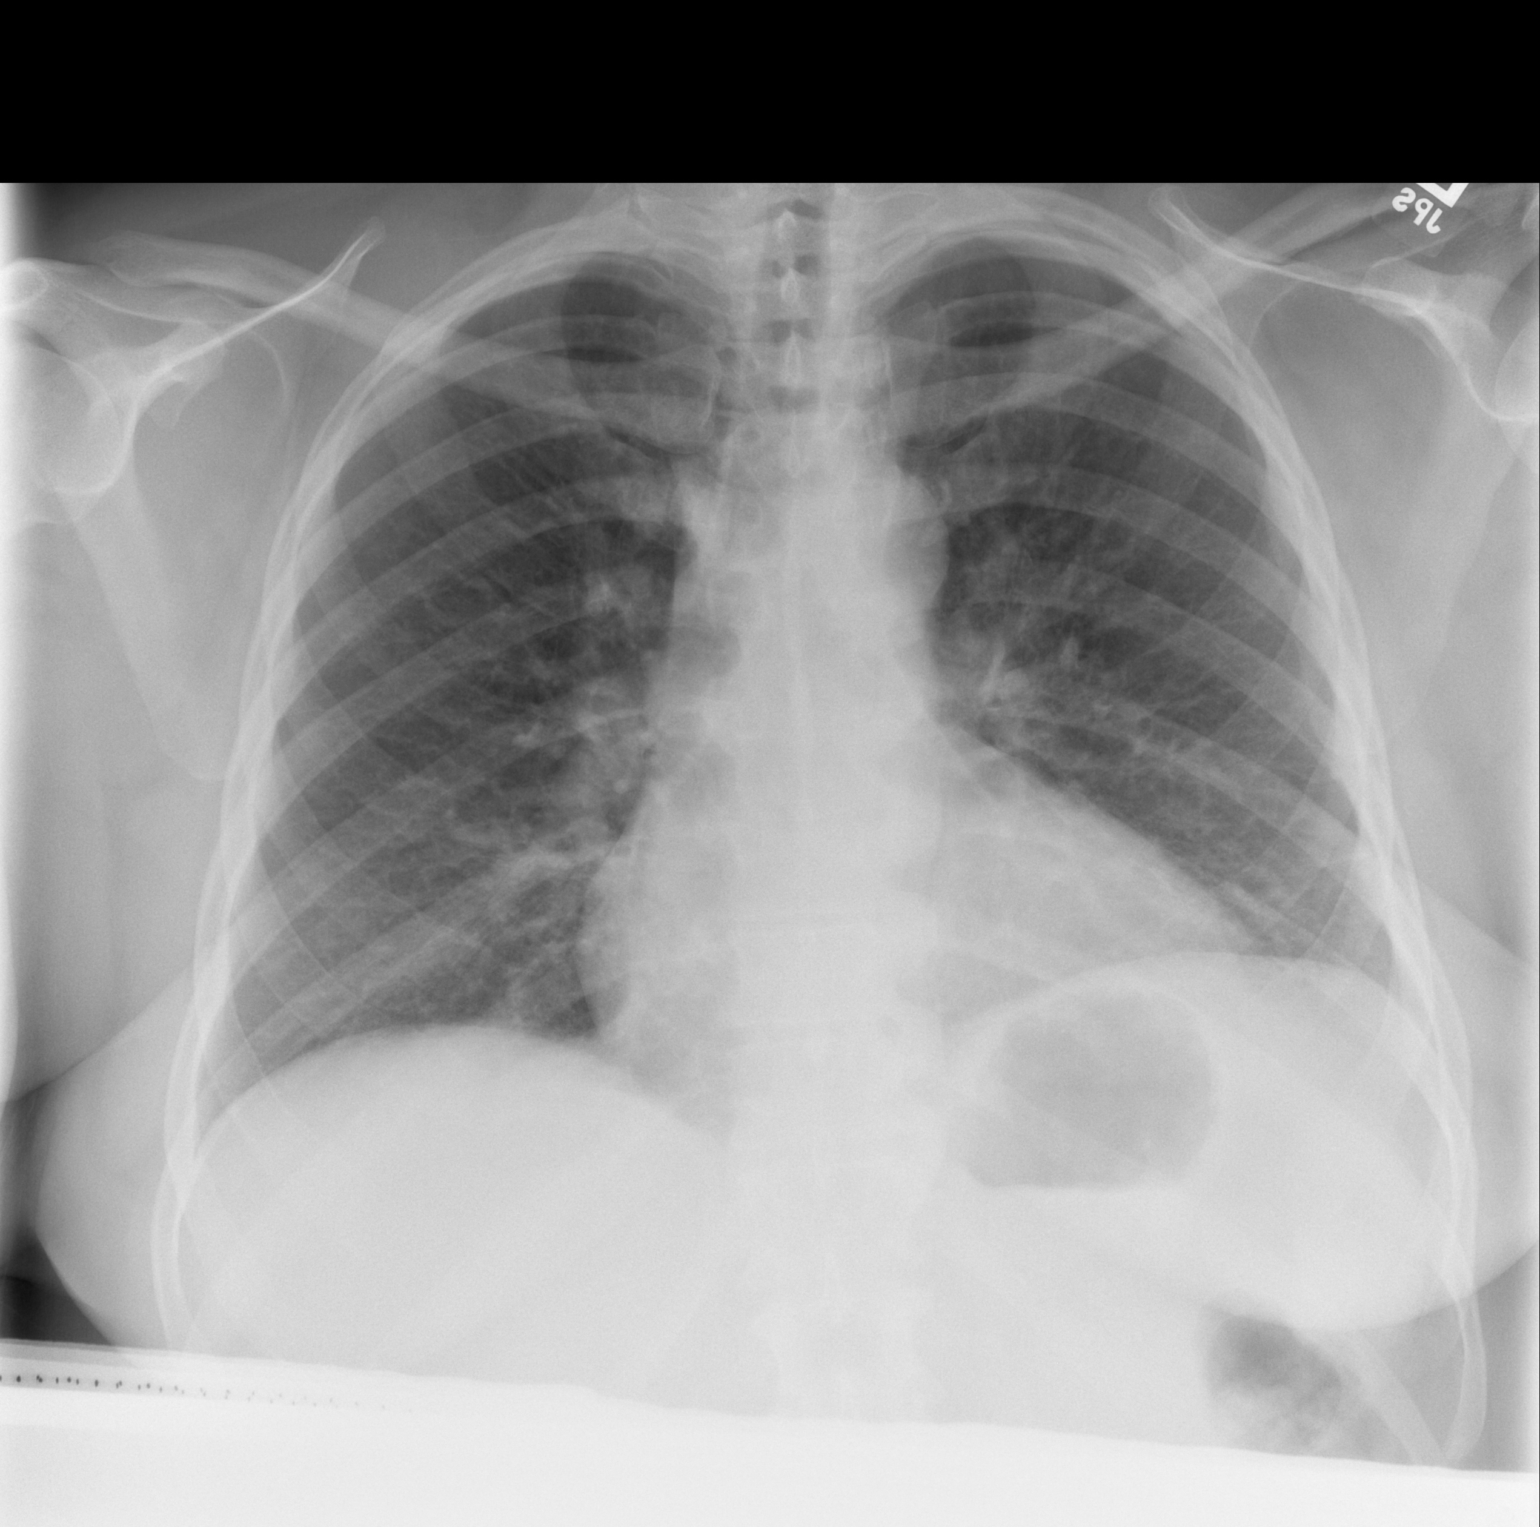

[w chest lat]
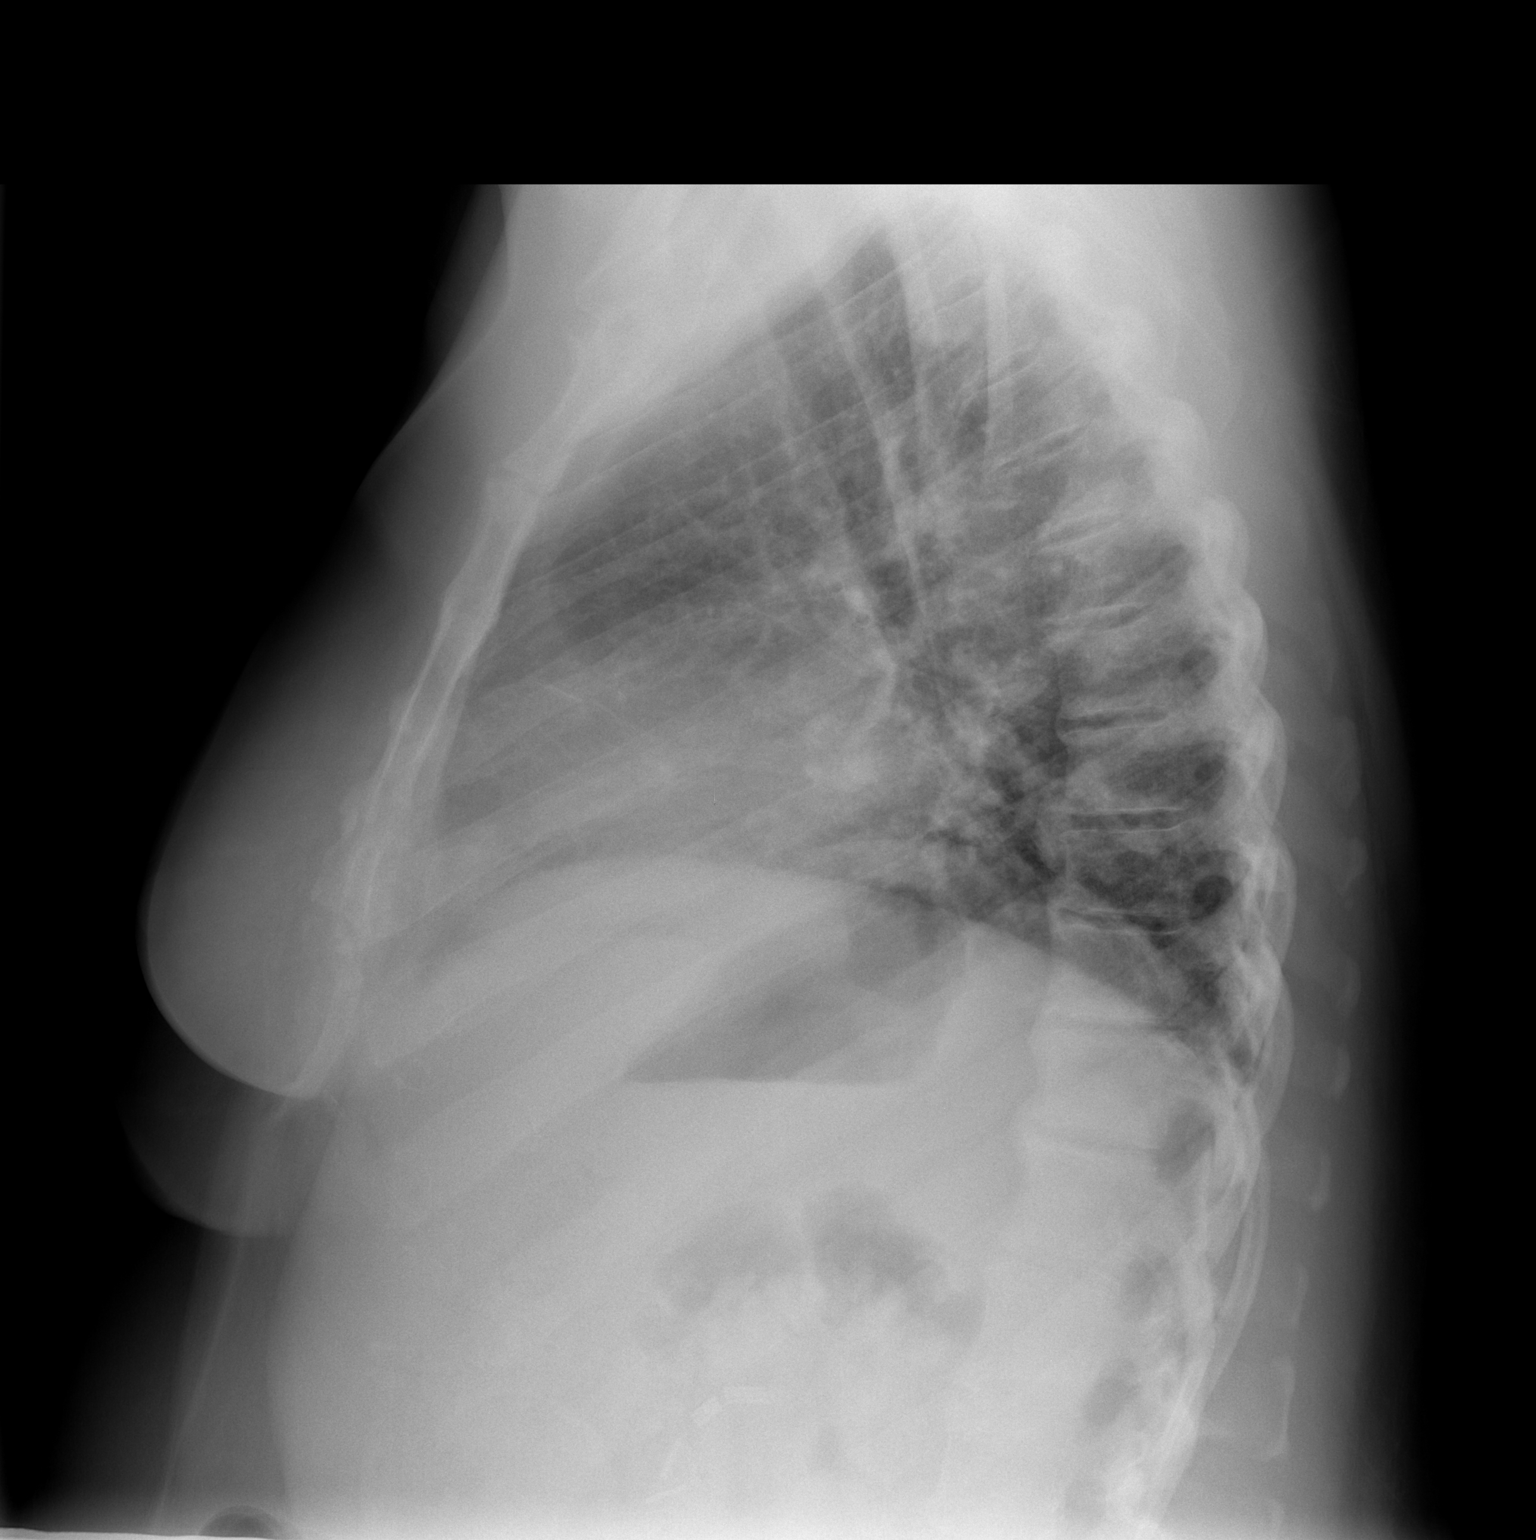

[2 of 2 positions shown; findings below may reference images not displayed]

FINDINGS: Minimal basilar atelectasis.  Negative for pneumonia,
edema, effusion or pneumothorax.  Midline trachea.  Stable heart
size.  Spondylosis of the thoracic spine
IMPRESSION: Stable exam.  No acute chest finding.

## 2013-06-07 ENCOUNTER — Ambulatory Visit: Payer: Medicaid Other | Attending: Internal Medicine | Admitting: Internal Medicine

## 2013-06-07 VITALS — BP 142/89 | HR 78 | Temp 97.8°F | Resp 17 | Wt 232.4 lb

## 2013-06-07 DIAGNOSIS — N3281 Overactive bladder: Secondary | ICD-10-CM

## 2013-06-07 DIAGNOSIS — G8929 Other chronic pain: Secondary | ICD-10-CM | POA: Insufficient documentation

## 2013-06-07 DIAGNOSIS — M129 Arthropathy, unspecified: Secondary | ICD-10-CM

## 2013-06-07 DIAGNOSIS — E669 Obesity, unspecified: Secondary | ICD-10-CM | POA: Insufficient documentation

## 2013-06-07 DIAGNOSIS — M2550 Pain in unspecified joint: Secondary | ICD-10-CM | POA: Insufficient documentation

## 2013-06-07 DIAGNOSIS — I10 Essential (primary) hypertension: Secondary | ICD-10-CM

## 2013-06-07 DIAGNOSIS — R52 Pain, unspecified: Secondary | ICD-10-CM | POA: Insufficient documentation

## 2013-06-07 DIAGNOSIS — M1990 Unspecified osteoarthritis, unspecified site: Secondary | ICD-10-CM

## 2013-06-07 DIAGNOSIS — K219 Gastro-esophageal reflux disease without esophagitis: Secondary | ICD-10-CM

## 2013-06-07 DIAGNOSIS — J45909 Unspecified asthma, uncomplicated: Secondary | ICD-10-CM | POA: Insufficient documentation

## 2013-06-07 DIAGNOSIS — M79609 Pain in unspecified limb: Secondary | ICD-10-CM

## 2013-06-07 DIAGNOSIS — Z23 Encounter for immunization: Secondary | ICD-10-CM

## 2013-06-07 DIAGNOSIS — F419 Anxiety disorder, unspecified: Secondary | ICD-10-CM

## 2013-06-07 DIAGNOSIS — F411 Generalized anxiety disorder: Secondary | ICD-10-CM

## 2013-06-07 DIAGNOSIS — N318 Other neuromuscular dysfunction of bladder: Secondary | ICD-10-CM

## 2013-06-07 DIAGNOSIS — R262 Difficulty in walking, not elsewhere classified: Secondary | ICD-10-CM | POA: Insufficient documentation

## 2013-06-07 MED ORDER — AMLODIPINE BESYLATE 10 MG PO TABS: 10.0000 mg | ORAL_TABLET | Freq: Every day | ORAL | Status: DC

## 2013-06-07 MED ORDER — ALBUTEROL SULFATE HFA 108 (90 BASE) MCG/ACT IN AERS: 2.0000 | INHALATION_SPRAY | Freq: Four times a day (QID) | RESPIRATORY_TRACT | Status: DC | PRN

## 2013-06-07 MED ORDER — TRAMADOL HCL 50 MG PO TABS: 50.0000 mg | ORAL_TABLET | Freq: Four times a day (QID) | ORAL | Status: DC | PRN

## 2013-06-07 MED ORDER — SOLIFENACIN SUCCINATE 5 MG PO TABS: 5.0000 mg | ORAL_TABLET | Freq: Every day | ORAL | Status: DC

## 2013-06-07 MED ORDER — FUROSEMIDE 40 MG PO TABS: 40.0000 mg | ORAL_TABLET | Freq: Every day | ORAL | Status: DC

## 2013-06-07 MED ORDER — ESOMEPRAZOLE MAGNESIUM 40 MG PO CPDR: 40.0000 mg | DELAYED_RELEASE_CAPSULE | Freq: Every day | ORAL | Status: DC

## 2013-06-07 MED ORDER — AZITHROMYCIN 500 MG PO TABS: 500.0000 mg | ORAL_TABLET | Freq: Every day | ORAL | Status: DC

## 2013-06-07 MED ORDER — ARIPIPRAZOLE 5 MG PO TABS: 5.0000 mg | ORAL_TABLET | Freq: Every day | ORAL | Status: DC

## 2013-06-07 MED ORDER — GABAPENTIN 300 MG PO CAPS: 300.0000 mg | ORAL_CAPSULE | Freq: Two times a day (BID) | ORAL | Status: DC

## 2013-06-07 NOTE — Patient Instructions (Signed)

## 2013-06-07 NOTE — Progress Notes (Signed)
Patient here to establish care Needs physical Has been coughing congested and stuffy nose For almost 2 weeks

## 2013-06-07 NOTE — Progress Notes (Signed)
Patient ID: Lori Bean, female   DOB: April 02, 1971, 42 y.o.   MRN: [id number]  CC: new patient  HPI: 42 year old female with past medical history of obesity, asthma, hypertension, anxiety and bipolar disorder who presented to clinic to establish primary care physician. Patient reports having arthritis type of pain and usually reports pain with walking. Patient reports no fractures and no falls. No lower extremity swelling. No morning stiffness. No reports of rash. No chest pain or palpitations.  No Known Allergies Past Medical History  Diagnosis Date  . Asthma   . Hypertension   . Anxiety   . Snoring disorder     Pt stated my boyfriend always wakes me up and tells me to breathe  . Heart murmur   . CHF (congestive heart failure)   . Shortness of breath   . Bronchitis     hx of  . Bipolar affective disorder     Schizophrenia  . Depression   . GERD (gastroesophageal reflux disease)   . Arthritis   . Anginal pain   . Overactive bladder   . Schizophrenia    Current Outpatient Prescriptions on File Prior to Visit  Medication Sig Dispense Refill  . oxyCODONE-acetaminophen (PERCOCET) 10-325 MG per tablet Take 1 tablet by mouth 3 (three) times daily as needed for pain. For pain  23 tablet  0  . [DISCONTINUED] solifenacin (VESICARE) 5 MG tablet Take 5 mg by mouth daily.       No current facility-administered medications on file prior to visit.   Family medical history significant for HTN, HLD  History   Social History  . Marital Status: Divorced    Spouse Name: N/A    Number of Children: N/A  . Years of Education: N/A   Occupational History  . Not on file.   Social History Main Topics  . Smoking status: Former Smoker -- 0.25 packs/day for 9 years    Types: Cigarettes  . Smokeless tobacco: Never Used  . Alcohol Use: Yes     Comment: One 40 oz. beer/day average--some tequilla  . Drug Use: No  . Sexual Activity: Not Currently   Other Topics Concern  . Not on file    Social History Narrative  . No narrative on file    Review of Systems  Constitutional: Negative for fever, chills, diaphoresis, activity change, appetite change and fatigue.  HENT: Negative for ear pain, nosebleeds, congestion, facial swelling, rhinorrhea, neck pain, neck stiffness and ear discharge.   Eyes: Negative for pain, discharge, redness, itching and visual disturbance.  Respiratory: Negative for cough, choking, chest tightness, shortness of breath, wheezing and stridor.   Cardiovascular: Negative for chest pain, palpitations and leg swelling.  Gastrointestinal: Negative for abdominal distention.  Genitourinary: Negative for dysuria, urgency, frequency, hematuria, flank pain, decreased urine volume, difficulty urinating and dyspareunia.  Musculoskeletal: positive for back pain, joint swelling, arthralgias and gait problem.  Neurological: Negative for dizziness, tremors, seizures, syncope, facial asymmetry, speech difficulty, weakness, light-headedness, numbness and headaches.  Hematological: Negative for adenopathy. Does not bruise/bleed easily.  Psychiatric/Behavioral: Negative for hallucinations, behavioral problems, confusion, dysphoric mood, decreased concentration and agitation.    Objective:   Filed Vitals:   06/07/13 0943  BP: 142/89  Pulse: 78  Temp: 97.8 F (36.6 C)  Resp: 17    Physical Exam  Constitutional: Appears well-developed and well-nourished. No distress.  HENT: Normocephalic. External right and left ear normal. Oropharynx is clear and moist.  Eyes: Conjunctivae and EOM are normal. PERRLA, no  scleral icterus.  Neck: Normal ROM. Neck supple. No JVD. No tracheal deviation. No thyromegaly.  CVS: RRR, S1/S2 +, no murmurs, no gallops, no carotid bruit.  Pulmonary: Effort and breath sounds normal, no stridor, rhonchi, wheezes, rales.  Abdominal: Soft. BS +,  no distension, tenderness, rebound or guarding.  Musculoskeletal: Normal range of motion. No edema  and no tenderness.  Lymphadenopathy: No lymphadenopathy noted, cervical, inguinal. Neuro: Alert. Normal reflexes, muscle tone coordination. No cranial nerve deficit. Skin: Skin is warm and dry. No rash noted. Not diaphoretic. No erythema. No pallor.  Psychiatric: Normal mood and affect. Behavior, judgment, thought content normal.   Lab Results  Component Value Date   WBC 12.1* 04/22/2013   HGB 13.3 04/22/2013   HCT 37.8 04/22/2013   MCV 91.3 04/22/2013   PLT 285 04/22/2013   Lab Results  Component Value Date   CREATININE 0.95 04/22/2013   BUN 12 04/22/2013   NA 138 04/22/2013   K 3.7 04/22/2013   CL 104 04/22/2013   CO2 25 04/22/2013    No results found for this basename: HGBA1C   Lipid Panel     Component Value Date/Time   CHOL  Value: 152        ATP III CLASSIFICATION:  <200     mg/dL   Desirable  161-096  mg/dL   Borderline High  >=045    mg/dL   High        40/98/1191 0740   TRIG 51 07/31/2009 0740   HDL 62 07/31/2009 0740   CHOLHDL 2.5 07/31/2009 0740   VLDL 10 07/31/2009 0740   LDLCALC  Value: 80        Total Cholesterol/HDL:CHD Risk Coronary Heart Disease Risk Table                     Men   Women  1/2 Average Risk   3.4   3.3  Average Risk       5.0   4.4  2 X Average Risk   9.6   7.1  3 X Average Risk  23.4   11.0        Use the calculated Patient Ratio above and the CHD Risk Table to determine the patient's CHD Risk.        ATP III CLASSIFICATION (LDL):  <100     mg/dL   Optimal  478-295  mg/dL   Near or Above                    Optimal  130-159  mg/dL   Borderline  621-308  mg/dL   High  >657     mg/dL   Very High 84/69/6295 0740       Assessment and plan:   Patient Active Problem List   Diagnosis Date Noted  . GERD (gastroesophageal reflux disease)     Priority: Medium - Continue Protonix   .  Chronic pain      Priority: Medium - Patient controlled with tramadol as well as oxycodone as needed. Prescription provided for tramadol. Referral to pain management clinic for oxycodone  provided.   . Overactive bladder     Priority: Medium - Continue vesicare   . Asthma 04/03/2012    Priority: Medium - stable, continue albuterol as needed  . Hypertension - We have discussed target BP range - I have advised pt to check BP regularly and to call us back if the numbers are higher than 140/90 - discussed  the importance of compliance with medical therapy and diet  - continue Norvasc and lasix; pt did not take morning medications

## 2013-06-21 ENCOUNTER — Other Ambulatory Visit: Payer: Self-pay | Admitting: Internal Medicine

## 2013-06-21 MED ORDER — OMEPRAZOLE 40 MG PO CPDR: 40.0000 mg | DELAYED_RELEASE_CAPSULE | Freq: Every day | ORAL | Status: DC

## 2013-07-12 IMAGING — CR DG CHEST 2V
2 series · 2 of 2 positions shown · non-contrast
Comparison: Chest x-ray of 04/16/2011

CLINICAL DATA: Back and chest pain

CHEST - 2 VIEW

[w chest pa]
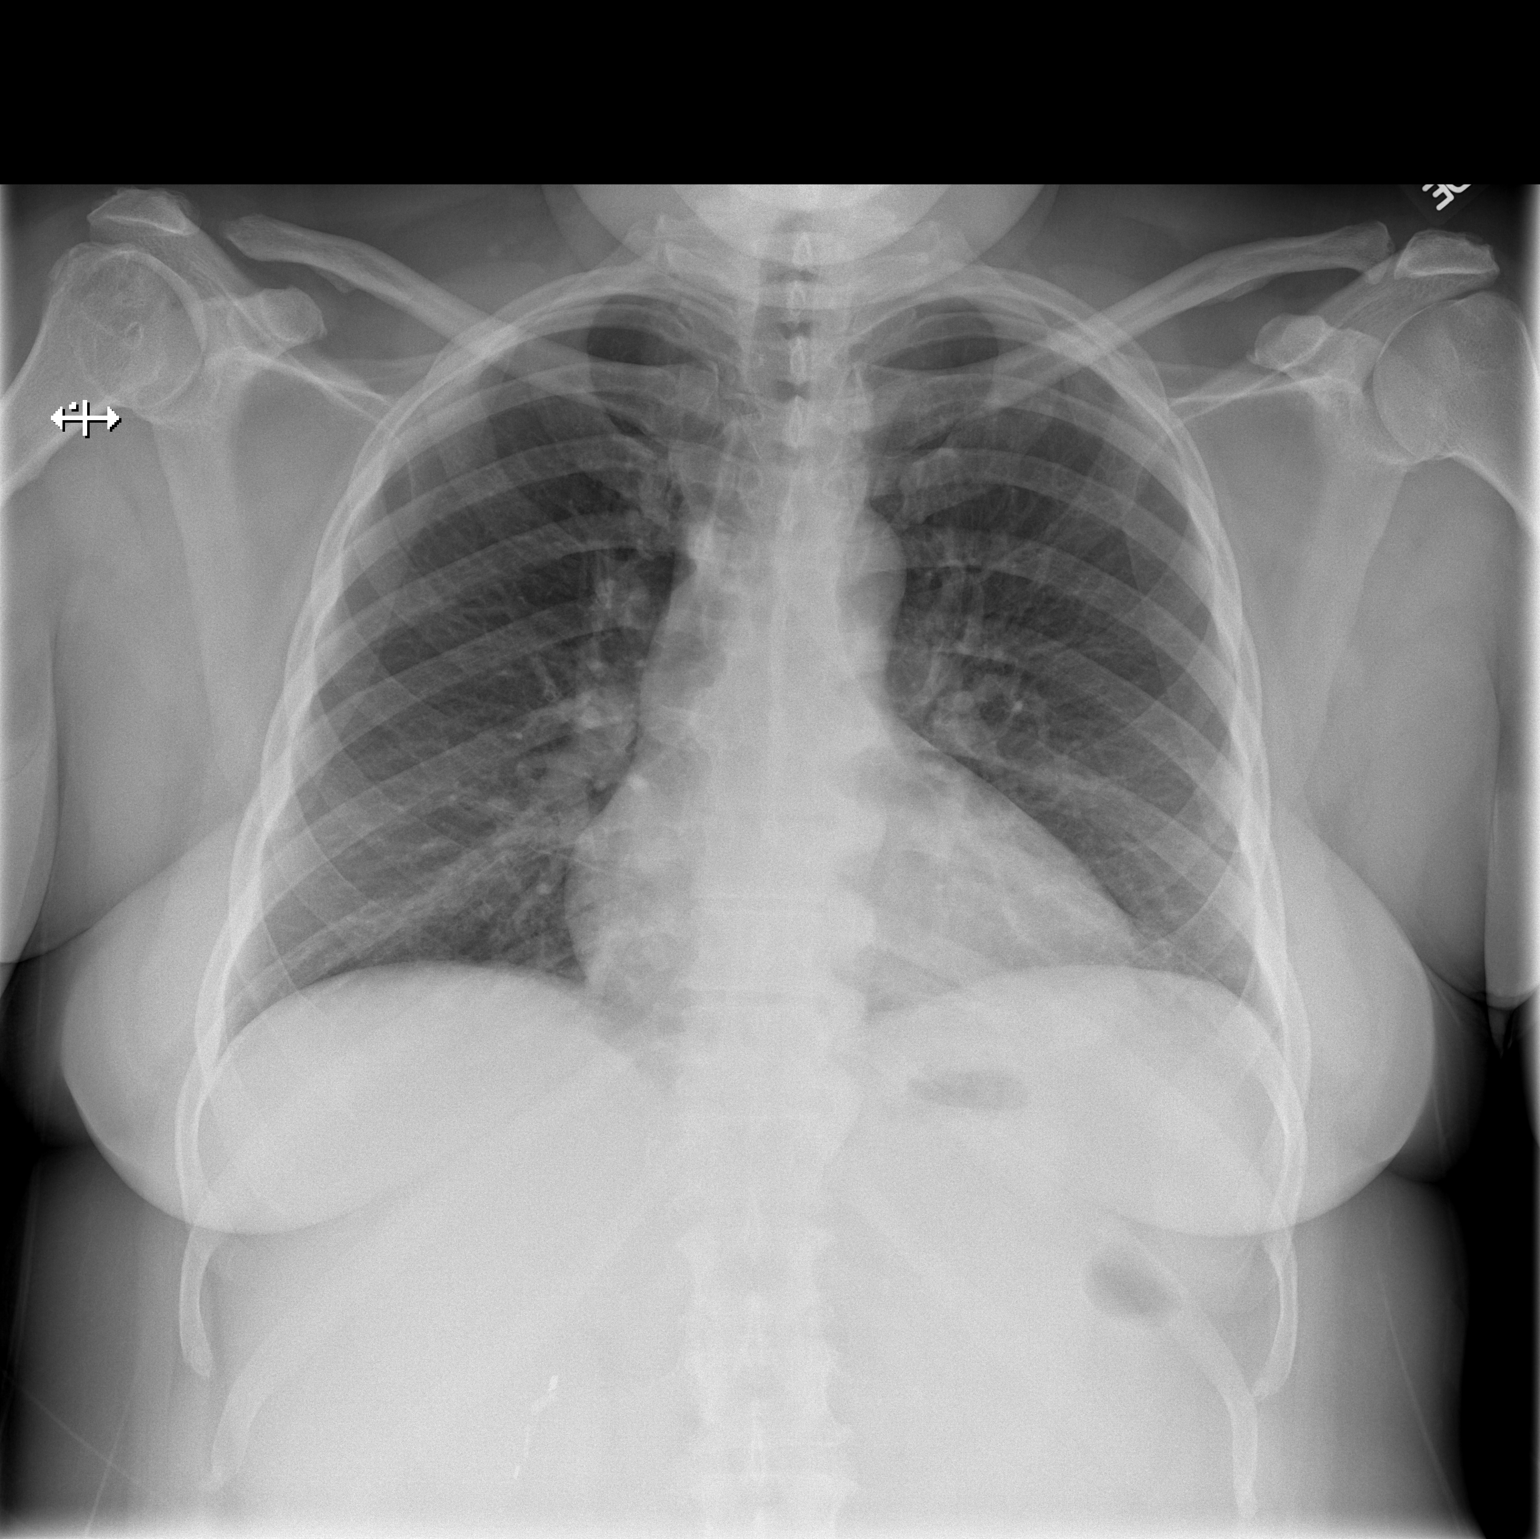

[w chest lat]
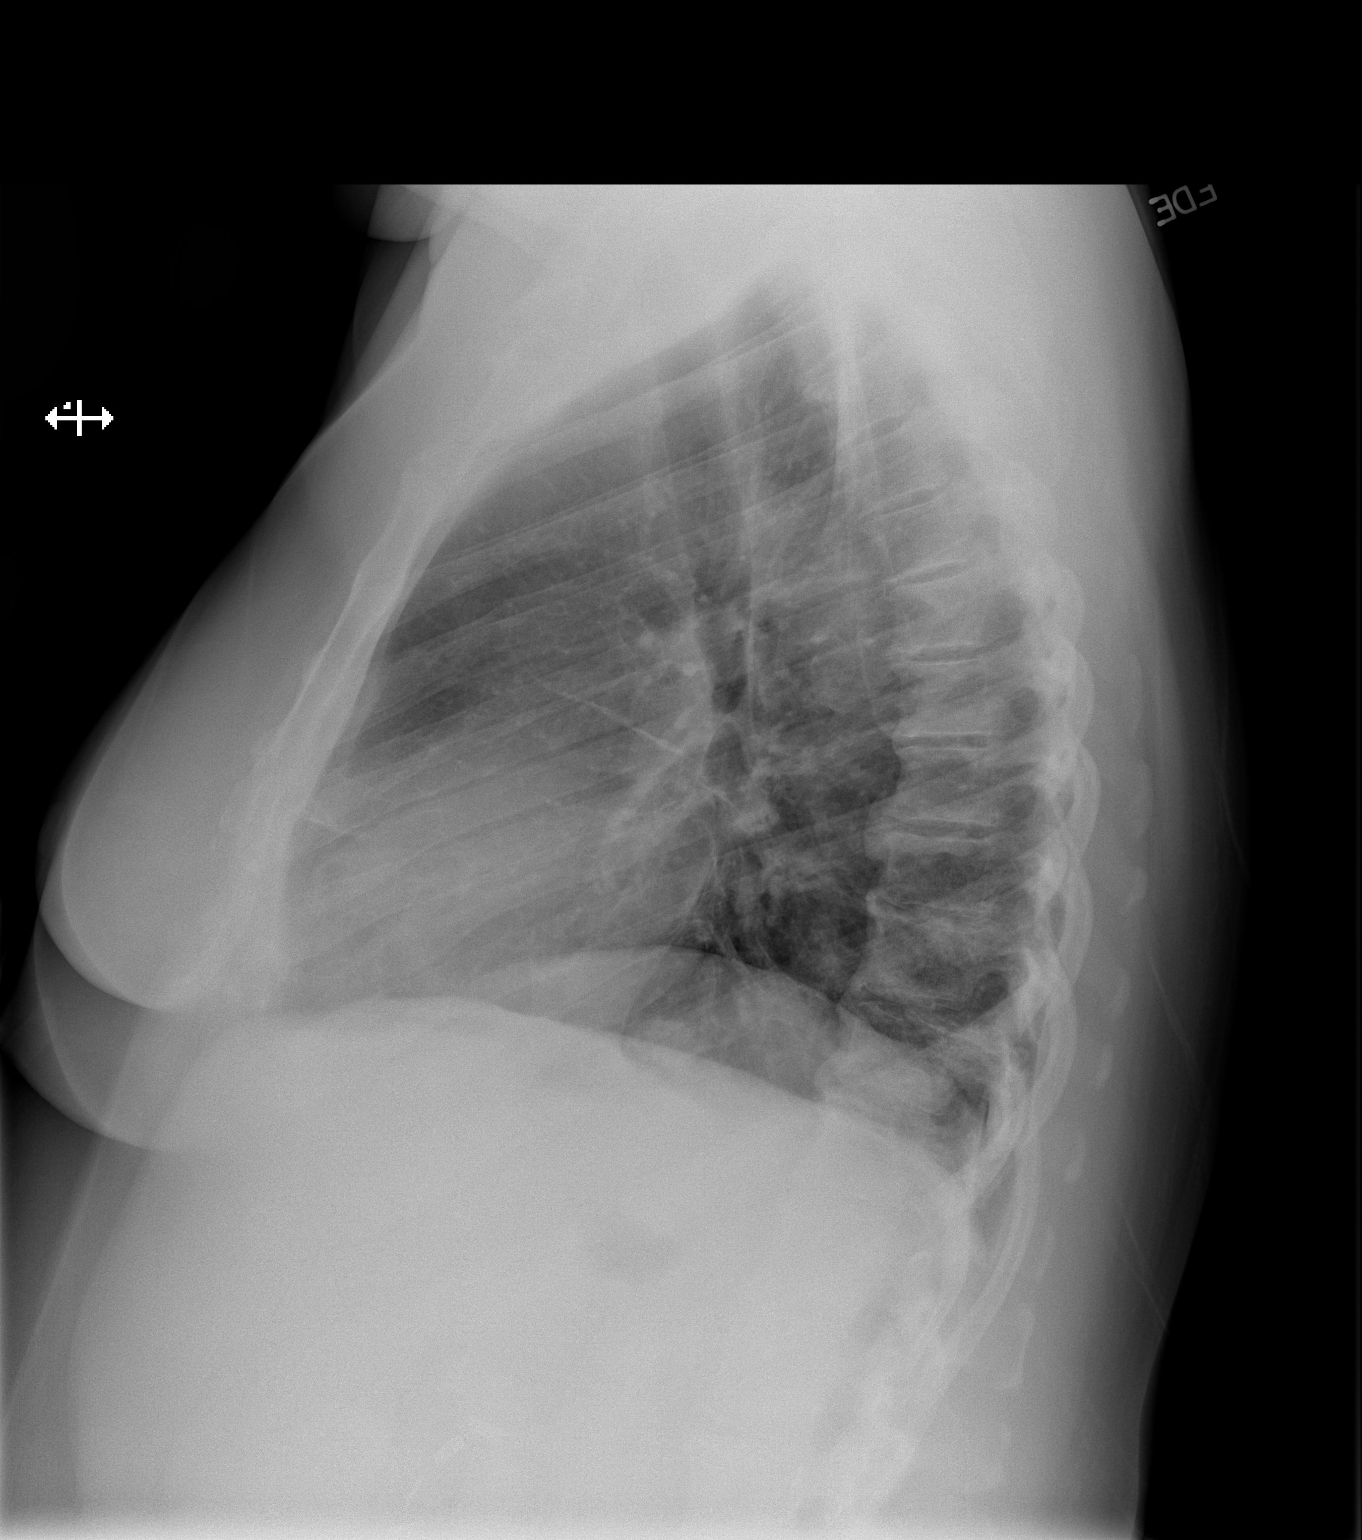

[2 of 2 positions shown; findings below may reference images not displayed]

FINDINGS: No active infiltrate or effusion is seen.  Mild
cardiomegaly is stable.  There are diffuse degenerative changes
throughout the thoracic spine.
IMPRESSION: Stable chest x-ray with mild cardiomegaly.  No active lung disease.

## 2013-07-16 IMAGING — CR DG TIBIA/FIBULA 2V*L*
4 series · 4 of 4 positions shown · non-contrast
Comparison: None

CLINICAL DATA: Left leg pain.

LEFT TIBIA AND FIBULA - 2 VIEW

[t tib/fib ap left (1 of 2)]
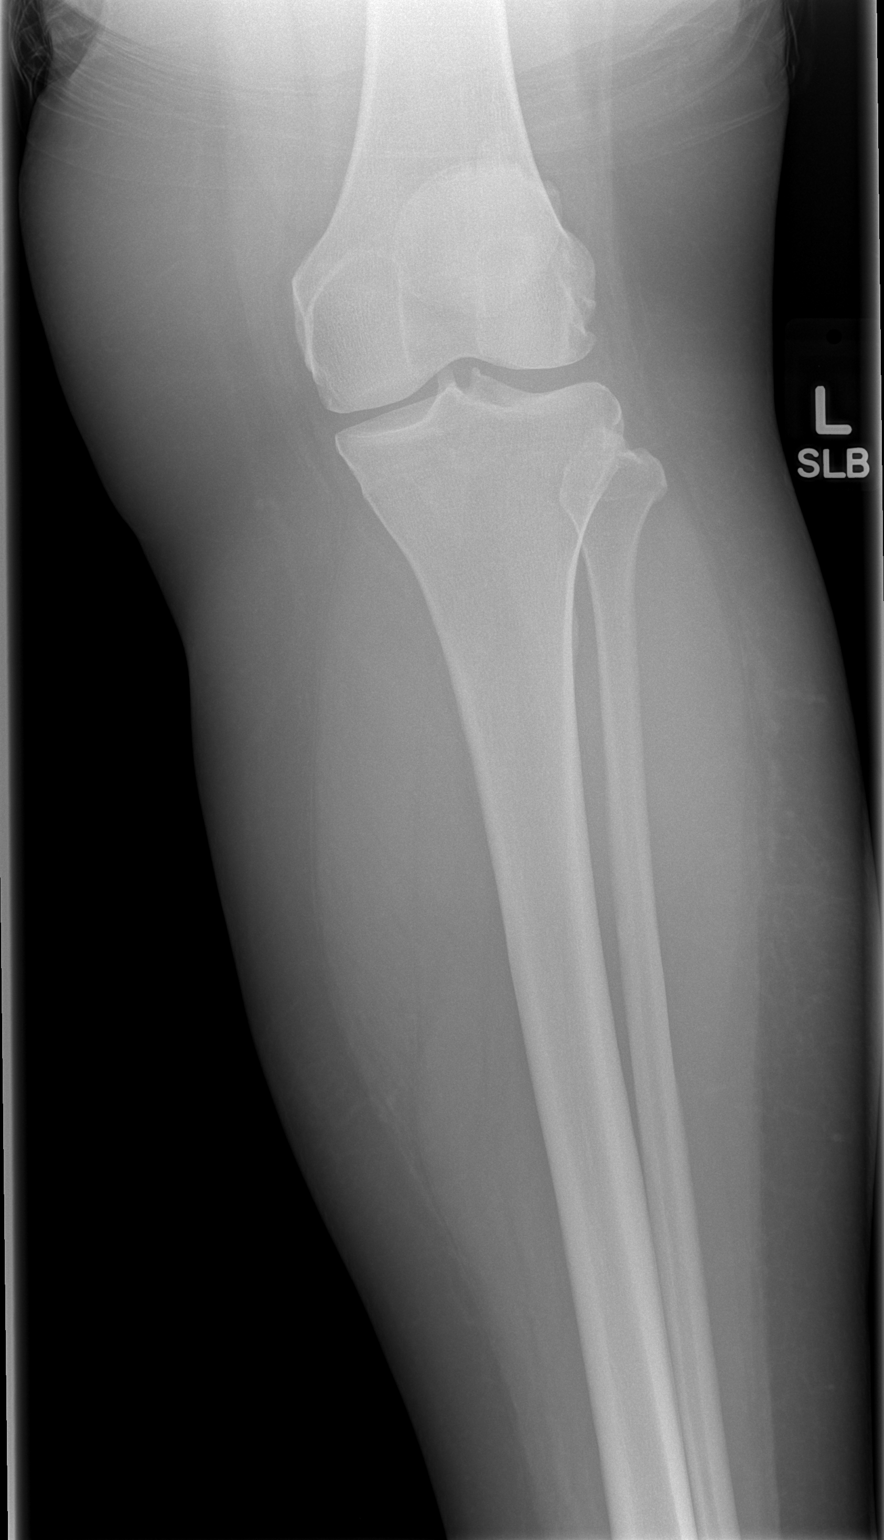

[t tib/fib ap left (2 of 2)]
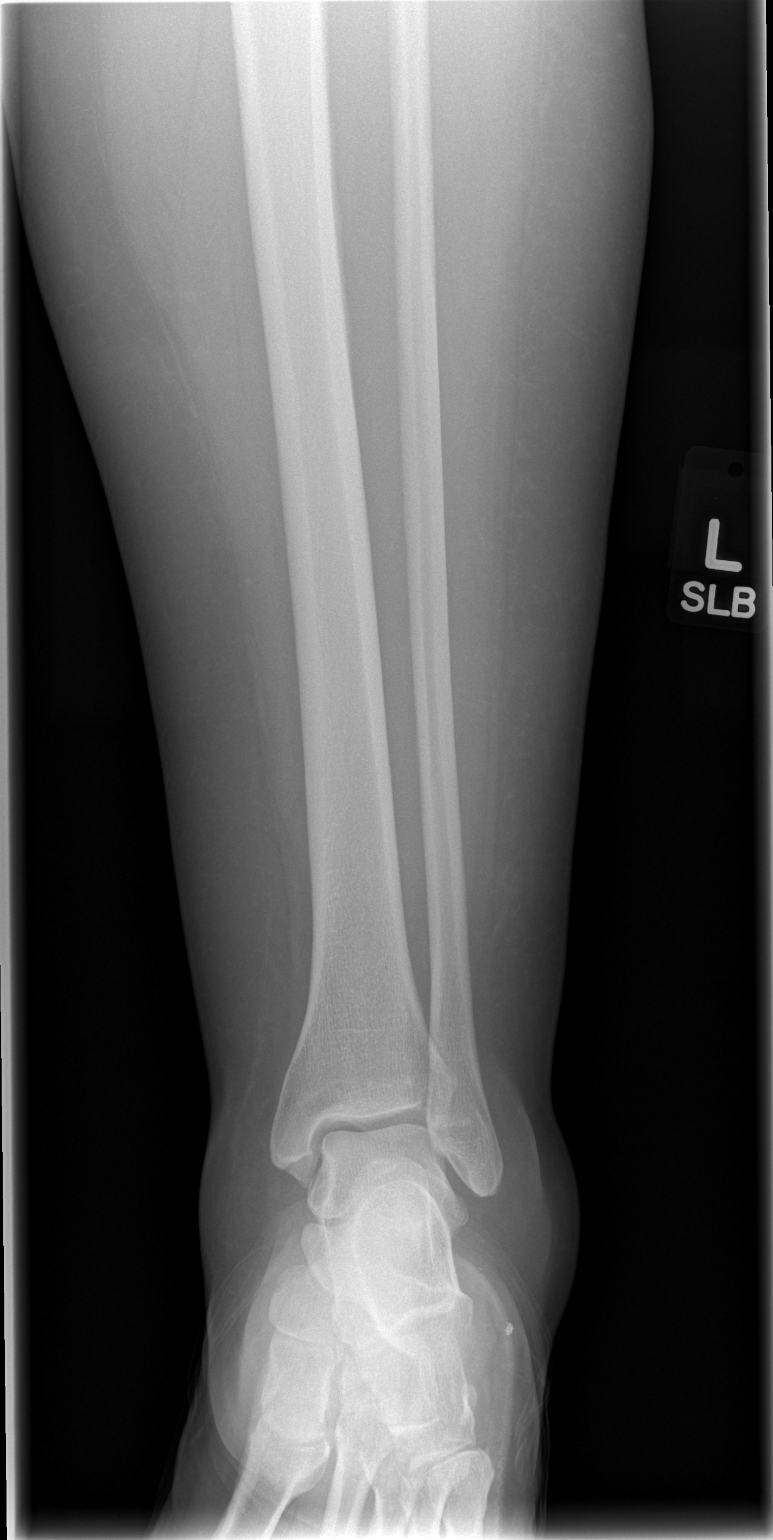

[t tib/fib lat left (1 of 2)]
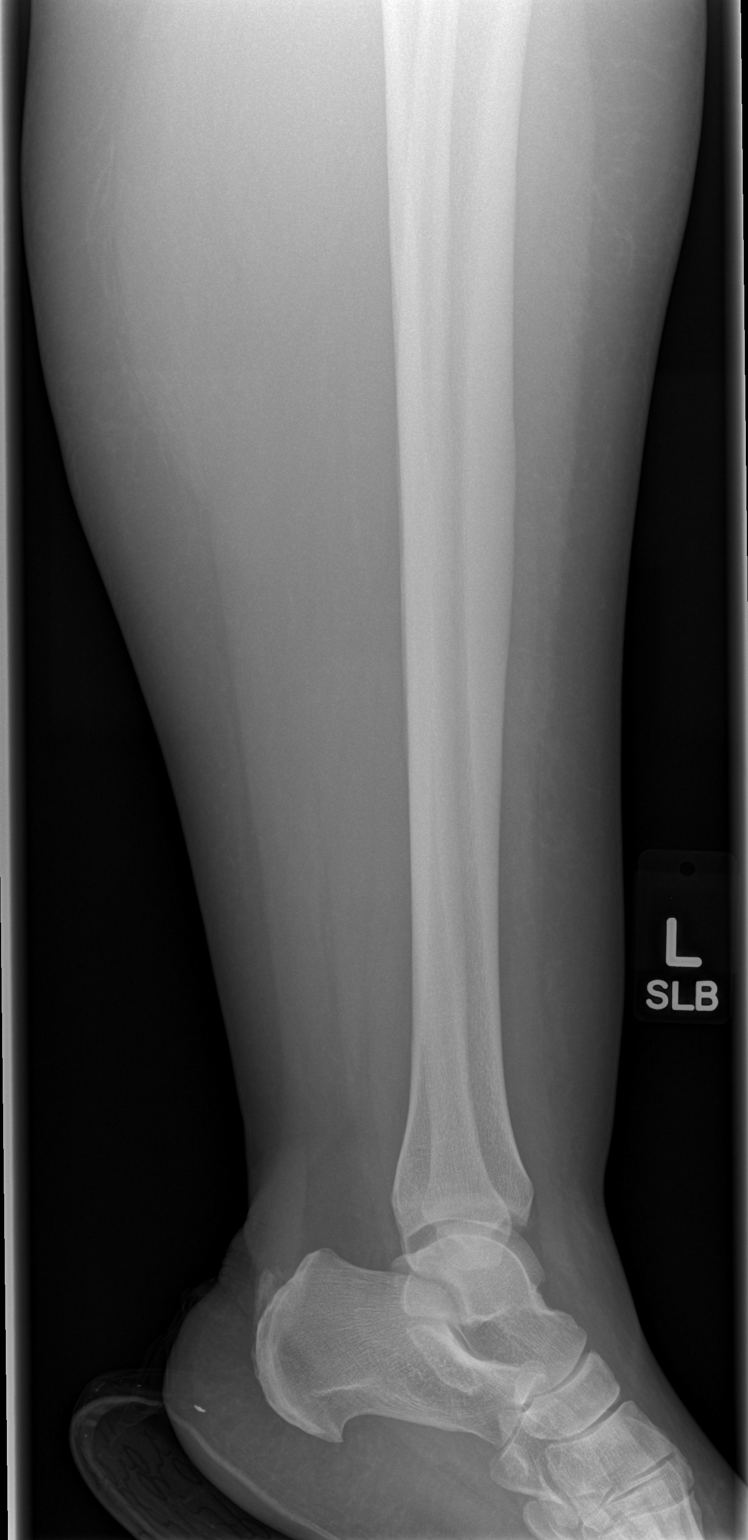

[t tib/fib lat left (2 of 2)]
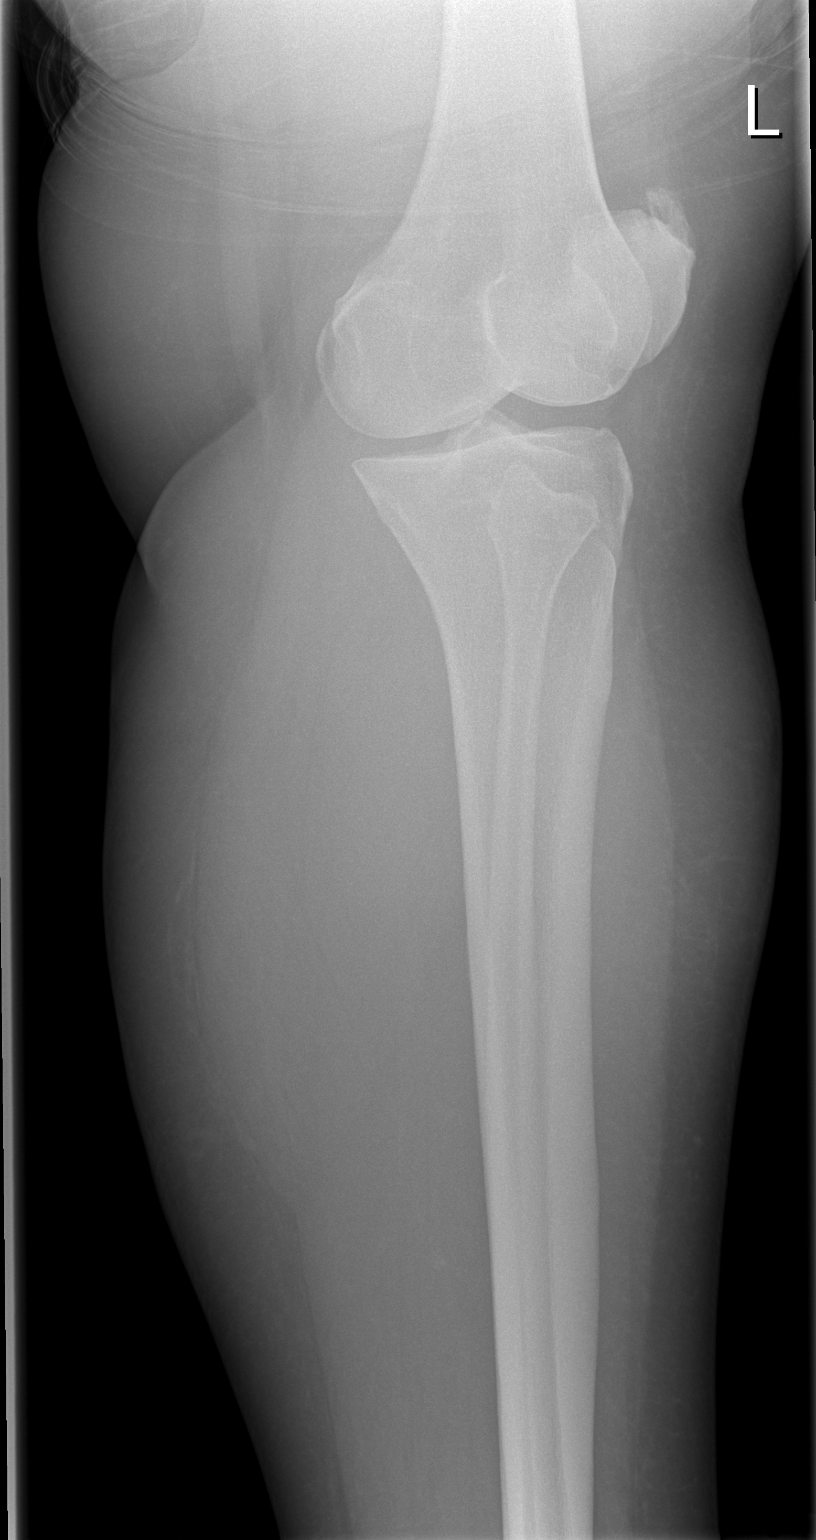

[4 of 4 positions shown; findings below may reference images not displayed]

FINDINGS: The knee and ankle joints are maintained.  No acute bony
findings.
IMPRESSION: No acute fracture.

## 2013-08-14 ENCOUNTER — Telehealth: Payer: Self-pay | Admitting: Internal Medicine

## 2013-08-14 NOTE — Telephone Encounter (Signed)
Pt called regarding her medication traMADol (ULTRAM) 50 MG tablet, pt would like a refill sent to her pharmacy [facility]: [phone], please contact pt

## 2013-08-15 ENCOUNTER — Emergency Department (HOSPITAL_COMMUNITY)
Admission: EM | Admit: 2013-08-15 | Discharge: 2013-08-15 | Disposition: A | Discharge status: Home / Self Care | Payer: Medicaid Other | Attending: Emergency Medicine | Admitting: Emergency Medicine

## 2013-08-15 ENCOUNTER — Encounter (HOSPITAL_COMMUNITY): Payer: Self-pay | Admitting: Emergency Medicine

## 2013-08-15 DIAGNOSIS — Z87448 Personal history of other diseases of urinary system: Secondary | ICD-10-CM | POA: Insufficient documentation

## 2013-08-15 DIAGNOSIS — I509 Heart failure, unspecified: Secondary | ICD-10-CM | POA: Insufficient documentation

## 2013-08-15 DIAGNOSIS — Y939 Activity, unspecified: Secondary | ICD-10-CM | POA: Insufficient documentation

## 2013-08-15 DIAGNOSIS — Y9289 Other specified places as the place of occurrence of the external cause: Secondary | ICD-10-CM | POA: Insufficient documentation

## 2013-08-15 DIAGNOSIS — Z87891 Personal history of nicotine dependence: Secondary | ICD-10-CM | POA: Insufficient documentation

## 2013-08-15 DIAGNOSIS — F319 Bipolar disorder, unspecified: Secondary | ICD-10-CM | POA: Insufficient documentation

## 2013-08-15 DIAGNOSIS — M545 Low back pain, unspecified: Secondary | ICD-10-CM

## 2013-08-15 DIAGNOSIS — J45901 Unspecified asthma with (acute) exacerbation: Secondary | ICD-10-CM | POA: Insufficient documentation

## 2013-08-15 DIAGNOSIS — G8929 Other chronic pain: Secondary | ICD-10-CM | POA: Insufficient documentation

## 2013-08-15 DIAGNOSIS — I209 Angina pectoris, unspecified: Secondary | ICD-10-CM | POA: Insufficient documentation

## 2013-08-15 DIAGNOSIS — Z9889 Other specified postprocedural states: Secondary | ICD-10-CM | POA: Insufficient documentation

## 2013-08-15 DIAGNOSIS — F411 Generalized anxiety disorder: Secondary | ICD-10-CM | POA: Insufficient documentation

## 2013-08-15 DIAGNOSIS — R011 Cardiac murmur, unspecified: Secondary | ICD-10-CM | POA: Insufficient documentation

## 2013-08-15 DIAGNOSIS — I10 Essential (primary) hypertension: Secondary | ICD-10-CM | POA: Insufficient documentation

## 2013-08-15 DIAGNOSIS — Z79899 Other long term (current) drug therapy: Secondary | ICD-10-CM | POA: Insufficient documentation

## 2013-08-15 DIAGNOSIS — W010XXA Fall on same level from slipping, tripping and stumbling without subsequent striking against object, initial encounter: Secondary | ICD-10-CM | POA: Insufficient documentation

## 2013-08-15 DIAGNOSIS — F209 Schizophrenia, unspecified: Secondary | ICD-10-CM | POA: Insufficient documentation

## 2013-08-15 DIAGNOSIS — K219 Gastro-esophageal reflux disease without esophagitis: Secondary | ICD-10-CM | POA: Insufficient documentation

## 2013-08-15 DIAGNOSIS — M129 Arthropathy, unspecified: Secondary | ICD-10-CM | POA: Insufficient documentation

## 2013-08-15 DIAGNOSIS — IMO0002 Reserved for concepts with insufficient information to code with codable children: Secondary | ICD-10-CM | POA: Insufficient documentation

## 2013-08-15 MED ORDER — METHOCARBAMOL 500 MG PO TABS: 500.0000 mg | ORAL_TABLET | Freq: Two times a day (BID) | ORAL | Status: DC

## 2013-08-15 MED ORDER — HYDROCODONE-ACETAMINOPHEN 5-325 MG PO TABS: 1.0000 | ORAL_TABLET | Freq: Four times a day (QID) | ORAL | Status: DC | PRN

## 2013-08-15 NOTE — ED Provider Notes (Signed)
CSN: [id number]     Arrival date & time 08/15/13  1717 History  This chart was scribed for non-physician practitioner, Santiago Glad, PA-C,working with Glynn Octave, MD, by Karle Plumber, ED Scribe.  This patient was seen in room TR10C/TR10C and the patient's care was started at 7:21 PM.  Chief Complaint  Patient presents with  . Back Pain   The history is provided by the patient. No language interpreter was used.   HPI Comments:  Lori Bean is a 42 y.o. female with h/o back surgery and chronic lower back pain presents to the Emergency Department complaining of worsening, severe lower back pain secondary to a fall four days ago. Pt reports catching herself before she actually hit the floor of the bath tub. She reports associated, numbness and tingling and radiating pain down her left leg. She reports taking tramadol, Aleve, and  Ibuprofen with no relief. She denies bowel or bladder incontinence. She denies numbness or tingling in her inner thighs. She denies fever, chills, SOB or IV drug use. Pt states her neurosurgeon is Dr. Aliene Beams.   Past Medical History  Diagnosis Date  . Asthma   . Hypertension   . Anxiety   . Snoring disorder     Pt stated my boyfriend always wakes me up and tells me to breathe  . Heart murmur   . CHF (congestive heart failure)   . Shortness of breath   . Bronchitis     hx of  . Bipolar affective disorder     Schizophrenia  . Depression   . GERD (gastroesophageal reflux disease)   . Arthritis   . Anginal pain   . Overactive bladder   . Schizophrenia    Past Surgical History  Procedure Laterality Date  . Rotator cuff repair      Right shoulder  . Eye surgery      Metal plate in right eye  . Tubal ligation    . Gallstones reomved    . Lumbar laminectomy/decompression microdiscectomy  04/01/2012    Procedure: LUMBAR LAMINECTOMY/DECOMPRESSION MICRODISCECTOMY 2 LEVELS;  Surgeon: Reinaldo Meeker, MD;  Location: [facility];  Service:  Neurosurgery;  Laterality: Left;  Lumbar four-five, lumbar five sacral one microdiscectomy   . Back surgery    . Lumbar wound debridement  04/29/2012    Procedure: LUMBAR WOUND DEBRIDEMENT;  Surgeon: Reinaldo Meeker, MD;  Location: [facility];  Service: Neurosurgery;  Laterality: N/A;  lumbar wound debridement   No family history on file. History  Substance Use Topics  . Smoking status: Former Smoker -- 0.25 packs/day for 9 years    Types: Cigarettes  . Smokeless tobacco: Never Used  . Alcohol Use: Yes     Comment: One 40 oz. beer/day average--some tequilla   OB History   Grav Para Term Preterm Abortions TAB SAB Ect Mult Living                 Review of Systems  Musculoskeletal: Positive for back pain.  All other systems reviewed and are negative.    Allergies  Review of patient's allergies indicates no known allergies.  Home Medications   Current Outpatient Rx  Name  Route  Sig  Dispense  Refill  . albuterol (PROVENTIL HFA;VENTOLIN HFA) 108 (90 BASE) MCG/ACT inhaler   Inhalation   Inhale 2 puffs into the lungs every 6 (six) hours as needed for wheezing or shortness of breath. For shortness of breath   1 Inhaler  6   . amLODipine (NORVASC) 10 MG tablet   Oral   Take 1 tablet (10 mg total) by mouth daily.   30 tablet   3   . ARIPiprazole (ABILIFY) 5 MG tablet   Oral   Take 1 tablet (5 mg total) by mouth daily.   30 tablet   3   . azithromycin (ZITHROMAX) 500 MG tablet   Oral   Take 1 tablet (500 mg total) by mouth daily.   5 tablet   0   . furosemide (LASIX) 40 MG tablet   Oral   Take 1 tablet (40 mg total) by mouth daily.   30 tablet   3   . gabapentin (NEURONTIN) 300 MG capsule   Oral   Take 1 capsule (300 mg total) by mouth 2 (two) times daily.   60 capsule   3   . omeprazole (PRILOSEC) 40 MG capsule   Oral   Take 1 capsule (40 mg total) by mouth daily.   30 capsule   3   . oxyCODONE-acetaminophen (PERCOCET) 10-325 MG per tablet   Oral    Take 1 tablet by mouth 3 (three) times daily as needed for pain. For pain   23 tablet   0   . solifenacin (VESICARE) 5 MG tablet   Oral   Take 1 tablet (5 mg total) by mouth daily.   30 tablet   3   . traMADol (ULTRAM) 50 MG tablet   Oral   Take 1 tablet (50 mg total) by mouth every 6 (six) hours as needed for pain.   60 tablet   0    Triage Vitals: BP 153/88  Pulse 92  Temp(Src) 98.4 F (36.9 C) (Oral)  Resp 18  SpO2 95%  LMP 08/03/2013 Physical Exam  Nursing note and vitals reviewed. Constitutional: She is oriented to person, place, and time. She appears well-developed and well-nourished.  HENT:  Head: Normocephalic and atraumatic.  Neck: Neck supple.  Cardiovascular: Normal rate, regular rhythm and normal heart sounds.    2+ dorsal pedis pulses bilaterally.  Pulmonary/Chest: Effort normal and breath sounds normal. No respiratory distress.  Musculoskeletal: Normal range of motion. She exhibits tenderness.  Lumbar spinal tender to palpation. No tenderness to palpation of the thoracic or cervical spine. No step offs or deformities. No saddle anesthesia.   Neurological: She is alert and oriented to person, place, and time. She has normal strength. No cranial nerve deficit or sensory deficit. Gait normal.  Reflex Scores:      Patellar reflexes are 2+ on the right side and 2+ on the left side.      Achilles reflexes are 2+ on the right side and 2+ on the left side. 2+ patellar reflexes. Muscle strength normal.    Skin: Skin is warm and dry. No erythema.  Psychiatric: She has a normal mood and affect. Her behavior is normal.    ED Course  Procedures (including critical care time) DIAGNOSTIC STUDIES: Oxygen Saturation is 95% on RA, adequate by my interpretation.   COORDINATION OF CARE: 7:28 PM- Will prescribe a muscle relaxer and pain medication. Advised pt to follow up with her neurologist. Pt verbalizes understanding and agrees to plan.  Medications - No data to  display  Labs Review Labs Reviewed - No data to display Imaging Review No results found.  EKG Interpretation   None       MDM  No diagnosis found. Patient with back pain.  No neurological deficits and  normal neuro exam.  Patient can walk but states is painful.  No loss of bowel or bladder control.  No concern for cauda equina.  No fever, night sweats, weight loss, h/o cancer, IVDU.  RICE protocol and pain medicine indicated and discussed with patient.  Patient instructed to follow up with the Neurosurgeon she has seen in the past.  Return precautions given.    I personally performed the services described in this documentation, which was scribed in my presence. The recorded information has been reviewed and is accurate.    Santiago Glad, PA-C 08/15/13 1940

## 2013-08-15 NOTE — ED Notes (Signed)
Pt st's she slid in the bathtub on 11/28 and now has pain in lower back radiating down left leg.

## 2013-08-16 NOTE — ED Provider Notes (Signed)
Medical screening examination/treatment/procedure(s) were performed by non-physician practitioner and as supervising physician I was immediately available for consultation/collaboration.  EKG Interpretation   None         Glynn Octave, MD 08/16/13 [phone]

## 2013-08-16 NOTE — Telephone Encounter (Signed)
Pt was seen in ER yesterday and prescribed pain meds

## 2013-08-24 ENCOUNTER — Encounter (HOSPITAL_COMMUNITY): Payer: Self-pay | Admitting: Emergency Medicine

## 2013-08-24 ENCOUNTER — Emergency Department (HOSPITAL_COMMUNITY): Payer: Medicaid Other

## 2013-08-24 ENCOUNTER — Emergency Department (HOSPITAL_COMMUNITY)
Admission: EM | Admit: 2013-08-24 | Discharge: 2013-08-24 | Disposition: A | Discharge status: Home / Self Care | Payer: Medicaid Other | Attending: Emergency Medicine | Admitting: Emergency Medicine

## 2013-08-24 DIAGNOSIS — F209 Schizophrenia, unspecified: Secondary | ICD-10-CM | POA: Insufficient documentation

## 2013-08-24 DIAGNOSIS — I509 Heart failure, unspecified: Secondary | ICD-10-CM | POA: Insufficient documentation

## 2013-08-24 DIAGNOSIS — R112 Nausea with vomiting, unspecified: Secondary | ICD-10-CM | POA: Insufficient documentation

## 2013-08-24 DIAGNOSIS — Z792 Long term (current) use of antibiotics: Secondary | ICD-10-CM | POA: Insufficient documentation

## 2013-08-24 DIAGNOSIS — J45901 Unspecified asthma with (acute) exacerbation: Secondary | ICD-10-CM | POA: Insufficient documentation

## 2013-08-24 DIAGNOSIS — M129 Arthropathy, unspecified: Secondary | ICD-10-CM | POA: Insufficient documentation

## 2013-08-24 DIAGNOSIS — Z79899 Other long term (current) drug therapy: Secondary | ICD-10-CM | POA: Insufficient documentation

## 2013-08-24 DIAGNOSIS — IMO0002 Reserved for concepts with insufficient information to code with codable children: Secondary | ICD-10-CM | POA: Insufficient documentation

## 2013-08-24 DIAGNOSIS — F17200 Nicotine dependence, unspecified, uncomplicated: Secondary | ICD-10-CM | POA: Insufficient documentation

## 2013-08-24 DIAGNOSIS — R011 Cardiac murmur, unspecified: Secondary | ICD-10-CM | POA: Insufficient documentation

## 2013-08-24 DIAGNOSIS — K219 Gastro-esophageal reflux disease without esophagitis: Secondary | ICD-10-CM | POA: Insufficient documentation

## 2013-08-24 DIAGNOSIS — F411 Generalized anxiety disorder: Secondary | ICD-10-CM | POA: Insufficient documentation

## 2013-08-24 DIAGNOSIS — H9209 Otalgia, unspecified ear: Secondary | ICD-10-CM | POA: Insufficient documentation

## 2013-08-24 DIAGNOSIS — J029 Acute pharyngitis, unspecified: Secondary | ICD-10-CM | POA: Insufficient documentation

## 2013-08-24 DIAGNOSIS — I10 Essential (primary) hypertension: Secondary | ICD-10-CM | POA: Insufficient documentation

## 2013-08-24 DIAGNOSIS — F319 Bipolar disorder, unspecified: Secondary | ICD-10-CM | POA: Insufficient documentation

## 2013-08-24 DIAGNOSIS — Z3202 Encounter for pregnancy test, result negative: Secondary | ICD-10-CM | POA: Insufficient documentation

## 2013-08-24 LAB — POCT I-STAT, CHEM 8
BUN: 7 mg/dL (ref 6–23)
Calcium, Ion: 1.19 mmol/L (ref 1.12–1.23)
Chloride: 102 mEq/L (ref 96–112)
Creatinine, Ser: 0.9 mg/dL (ref 0.50–1.10)
Glucose, Bld: 102 mg/dL — ABNORMAL HIGH (ref 70–99)
HCT: 41 % (ref 36.0–46.0)
Hemoglobin: 13.9 g/dL (ref 12.0–15.0)
Potassium: 3.2 mEq/L — ABNORMAL LOW (ref 3.5–5.1)
Sodium: 141 mEq/L (ref 135–145)
TCO2: 25 mmol/L (ref 0–100)

## 2013-08-24 LAB — CBC WITH DIFFERENTIAL/PLATELET
Basophils Absolute: 0 10*3/uL (ref 0.0–0.1)
Basophils Relative: 0 % (ref 0–1)
Eosinophils Absolute: 0.3 10*3/uL (ref 0.0–0.7)
Eosinophils Relative: 4 % (ref 0–5)
HCT: 38.1 % (ref 36.0–46.0)
Hemoglobin: 12.6 g/dL (ref 12.0–15.0)
Lymphocytes Relative: 33 % (ref 12–46)
Lymphs Abs: 2.2 10*3/uL (ref 0.7–4.0)
MCH: 30.1 pg (ref 26.0–34.0)
MCHC: 33.1 g/dL (ref 30.0–36.0)
MCV: 90.9 fL (ref 78.0–100.0)
Monocytes Absolute: 1.1 10*3/uL — ABNORMAL HIGH (ref 0.1–1.0)
Monocytes Relative: 17 % — ABNORMAL HIGH (ref 3–12)
Neutro Abs: 3.1 10*3/uL (ref 1.7–7.7)
Neutrophils Relative %: 47 % (ref 43–77)
Platelets: 245 10*3/uL (ref 150–400)
RBC: 4.19 MIL/uL (ref 3.87–5.11)
RDW: 13.4 % (ref 11.5–15.5)
WBC: 6.7 10*3/uL (ref 4.0–10.5)

## 2013-08-24 LAB — POCT PREGNANCY, URINE: Preg Test, Ur: NEGATIVE

## 2013-08-24 LAB — URINALYSIS, ROUTINE W REFLEX MICROSCOPIC
Bilirubin Urine: NEGATIVE
Glucose, UA: NEGATIVE mg/dL
Hgb urine dipstick: NEGATIVE
Ketones, ur: NEGATIVE mg/dL
Leukocytes, UA: NEGATIVE
Nitrite: NEGATIVE
Protein, ur: NEGATIVE mg/dL
Specific Gravity, Urine: 1.018 (ref 1.005–1.030)
Urobilinogen, UA: 0.2 mg/dL (ref 0.0–1.0)
pH: 6.5 (ref 5.0–8.0)

## 2013-08-24 LAB — PRO B NATRIURETIC PEPTIDE: Pro B Natriuretic peptide (BNP): 60.6 pg/mL (ref 0–125)

## 2013-08-24 MED ORDER — ALBUTEROL SULFATE HFA 108 (90 BASE) MCG/ACT IN AERS
2.0000 | INHALATION_SPRAY | RESPIRATORY_TRACT | Status: DC | PRN
  Administered 2013-08-24: 2 via RESPIRATORY_TRACT
  Filled 2013-08-24: qty 6.7

## 2013-08-24 MED ORDER — ALBUTEROL (5 MG/ML) CONTINUOUS INHALATION SOLN
10.0000 mg/h | INHALATION_SOLUTION | RESPIRATORY_TRACT | Status: AC
  Administered 2013-08-24: 10 mg/h via RESPIRATORY_TRACT

## 2013-08-24 MED ORDER — ONDANSETRON HCL 4 MG/2ML IJ SOLN
4.0000 mg | Freq: Once | INTRAMUSCULAR | Status: AC
  Administered 2013-08-24: 4 mg via INTRAVENOUS
  Filled 2013-08-24: qty 2

## 2013-08-24 MED ORDER — PREDNISONE 20 MG PO TABS: 40.0000 mg | ORAL_TABLET | Freq: Every day | ORAL | Status: DC

## 2013-08-24 MED ORDER — ALBUTEROL SULFATE (5 MG/ML) 0.5% IN NEBU: 2.5000 mg | INHALATION_SOLUTION | RESPIRATORY_TRACT | Status: DC | PRN

## 2013-08-24 MED ORDER — ALBUTEROL (5 MG/ML) CONTINUOUS INHALATION SOLN
10.0000 mg/h | INHALATION_SOLUTION | RESPIRATORY_TRACT | Status: AC
  Administered 2013-08-24: 10 mg/h via RESPIRATORY_TRACT
  Filled 2013-08-24: qty 20

## 2013-08-24 MED ORDER — IPRATROPIUM BROMIDE 0.02 % IN SOLN
1.0000 mg | Freq: Once | RESPIRATORY_TRACT | Status: AC
  Administered 2013-08-24: 1 mg via RESPIRATORY_TRACT
  Filled 2013-08-24: qty 5

## 2013-08-24 MED ORDER — AZITHROMYCIN 250 MG PO TABS: 250.0000 mg | ORAL_TABLET | Freq: Every day | ORAL | Status: DC

## 2013-08-24 MED ORDER — METHYLPREDNISOLONE SODIUM SUCC 125 MG IJ SOLR
125.0000 mg | Freq: Once | INTRAMUSCULAR | Status: AC
  Administered 2013-08-24: 125 mg via INTRAVENOUS
  Filled 2013-08-24: qty 2

## 2013-08-24 MED ORDER — POTASSIUM CHLORIDE CRYS ER 20 MEQ PO TBCR
40.0000 meq | EXTENDED_RELEASE_TABLET | Freq: Once | ORAL | Status: AC
  Administered 2013-08-24: 40 meq via ORAL
  Filled 2013-08-24: qty 2

## 2013-08-24 NOTE — ED Notes (Signed)
Bed: WA06 Expected date:  Expected time:  Means of arrival:  Comments: EMS-SOB-asthma

## 2013-08-24 NOTE — ED Notes (Addendum)
Per EMS, pt was picked up from [facility]. Pt states she has been coughing and had shortness of breath since Saturday. Pt has hx of asthma and CHF. Pt states her home nebulizer did not provide relief. Pt put on atrovent .5mg /5mg  albuteral neb en route to hospital.

## 2013-08-24 NOTE — ED Provider Notes (Signed)
CSN: [id number]     Arrival date & time 08/24/13  1232 History   First MD Initiated Contact with Patient 08/24/13 1500     Chief Complaint  Patient presents with  . Shortness of Breath   (Consider location/radiation/quality/duration/timing/severity/associated sxs/prior Treatment) Patient is a 42 y.o. female presenting with shortness of breath. The history is provided by medical records and the patient. No language interpreter was used.  Shortness of Breath Associated symptoms: cough, ear pain, headaches, sore throat, vomiting (post-tussive) and wheezing   Associated symptoms: no abdominal pain, no chest pain, no fever and no rash     Lori Bean is a 42 y.o. female  with a hx of asthma, HTN, anxiety, CHF, GERD, schizophrenia, bipolar disorder presents to the Emergency Department complaining of gradual, persistent, progressively worsening SOB with associated cough onset 5 days ago. Associated symptoms include post-tussive emesis, chills, rhinorrhea, nasal congestion, nausea.  Pt reports her cough is nonproductive. Pt developed sore throat and rib pain with movement and breathing beginning several days after the coughing began.  Pt has been taking Nyquil without relief of her symptoms.  Lying still makes the rib pain better, but is not making the other symptoms better and exertion makes it worse.  Pt denies measured fever, headache, neck pain, abd pain, diarrhea, weakness, dizziness, syncope, dysuria.  Pt endorses a Hx asthma and normally usually obtains relief with her home nebulizer but this week the treatments are only helping for a short time before the SOB returns.     Past Medical History  Diagnosis Date  . Asthma   . Hypertension   . Anxiety   . Snoring disorder     Pt stated my boyfriend always wakes me up and tells me to breathe  . Heart murmur   . CHF (congestive heart failure)   . Shortness of breath   . Bronchitis     hx of  . Bipolar affective disorder      Schizophrenia  . Depression   . GERD (gastroesophageal reflux disease)   . Arthritis   . Anginal pain   . Overactive bladder   . Schizophrenia    Past Surgical History  Procedure Laterality Date  . Rotator cuff repair      Right shoulder  . Eye surgery      Metal plate in right eye  . Tubal ligation    . Gallstones reomved    . Lumbar laminectomy/decompression microdiscectomy  04/01/2012    Procedure: LUMBAR LAMINECTOMY/DECOMPRESSION MICRODISCECTOMY 2 LEVELS;  Surgeon: Reinaldo Meeker, MD;  Location: [facility];  Service: Neurosurgery;  Laterality: Left;  Lumbar four-five, lumbar five sacral one microdiscectomy   . Back surgery    . Lumbar wound debridement  04/29/2012    Procedure: LUMBAR WOUND DEBRIDEMENT;  Surgeon: Reinaldo Meeker, MD;  Location: [facility];  Service: Neurosurgery;  Laterality: N/A;  lumbar wound debridement   No family history on file. History  Substance Use Topics  . Smoking status: Current Every Day Smoker -- 0.25 packs/day for 9 years    Types: Cigarettes  . Smokeless tobacco: Never Used  . Alcohol Use: Yes     Comment: One 40 oz. beer/day average--some tequilla   OB History   Grav Para Term Preterm Abortions TAB SAB Ect Mult Living                 Review of Systems  Constitutional: Positive for chills and fatigue. Negative for fever and appetite change.  HENT: Positive for congestion, ear pain, postnasal drip, rhinorrhea, sinus pressure, sore throat and voice change (hoarseness, no muffled voice). Negative for ear discharge and mouth sores.   Eyes: Negative for visual disturbance.  Respiratory: Positive for cough, chest tightness, shortness of breath and wheezing. Negative for stridor.   Cardiovascular: Negative for chest pain, palpitations and leg swelling.  Gastrointestinal: Positive for nausea and vomiting (post-tussive). Negative for abdominal pain and diarrhea.  Genitourinary: Negative for dysuria, urgency, frequency and hematuria.   Musculoskeletal: Negative for arthralgias, back pain, myalgias and neck stiffness.  Skin: Negative for rash.  Neurological: Positive for headaches. Negative for syncope, light-headedness and numbness.  Hematological: Negative for adenopathy.  Psychiatric/Behavioral: The patient is not nervous/anxious.   All other systems reviewed and are negative.    Allergies  Review of patient's allergies indicates no known allergies.  Home Medications   Current Outpatient Rx  Name  Route  Sig  Dispense  Refill  . albuterol (PROVENTIL HFA;VENTOLIN HFA) 108 (90 BASE) MCG/ACT inhaler   Inhalation   Inhale 2 puffs into the lungs every 6 (six) hours as needed for wheezing or shortness of breath.         [name] amLODipine (NORVASC) 10 MG tablet   Oral   Take 1 tablet (10 mg total) by mouth daily.   30 tablet   3   . ARIPiprazole (ABILIFY) 20 MG tablet   Oral   Take 20 mg by mouth at bedtime.         [name] esomeprazole (NEXIUM) 40 MG capsule   Oral   Take 40 mg by mouth daily at 12 noon.         . furosemide (LASIX) 40 MG tablet   Oral   Take 1 tablet (40 mg total) by mouth daily.   30 tablet   3   . gabapentin (NEURONTIN) 300 MG capsule   Oral   Take 1 capsule (300 mg total) by mouth 2 (two) times daily.   60 capsule   3   . HYDROcodone-acetaminophen (NORCO/VICODIN) 5-325 MG per tablet   Oral   Take 1-2 tablets by mouth every 6 (six) hours as needed.   20 tablet   0   . methocarbamol (ROBAXIN) 500 MG tablet   Oral   Take 1 tablet (500 mg total) by mouth 2 (two) times daily.   20 tablet   0   . solifenacin (VESICARE) 5 MG tablet   Oral   Take 1 tablet (5 mg total) by mouth daily.   30 tablet   3   . traMADol (ULTRAM) 50 MG tablet   Oral   Take 50 mg by mouth every 6 (six) hours as needed (pain).         . triamcinolone cream (KENALOG) 0.1 %   Topical   Apply 1 application topically 2 (two) times daily as needed (eczema).         [name] albuterol (PROVENTIL) (5 MG/ML)  0.5% nebulizer solution   Nebulization   Take 0.5 mLs (2.5 mg total) by nebulization every 4 (four) hours as needed for wheezing or shortness of breath.   20 mL   12   . azithromycin (ZITHROMAX) 250 MG tablet   Oral   Take 1 tablet (250 mg total) by mouth daily. Take first 2 tablets together, then 1 every day until finished.   6 tablet   0   . predniSONE (DELTASONE) 20 MG tablet   Oral   Take 2 tablets (  40 mg total) by mouth daily.   10 tablet   0    BP 155/67  Pulse 98  Temp(Src) 97.7 F (36.5 C) (Oral)  Resp 20  SpO2 99%  LMP 08/03/2013 Physical Exam  Nursing note and vitals reviewed. Constitutional: She is oriented to person, place, and time. She appears well-developed and well-nourished. No distress.  Awake, alert, nontoxic appearance  HENT:  Head: Normocephalic and atraumatic.  Right Ear: Tympanic membrane, external ear and ear canal normal.  Left Ear: Tympanic membrane, external ear and ear canal normal.  Nose: Mucosal edema and rhinorrhea present. No epistaxis. Right sinus exhibits no maxillary sinus tenderness and no frontal sinus tenderness. Left sinus exhibits no maxillary sinus tenderness and no frontal sinus tenderness.  Mouth/Throat: Uvula is midline, oropharynx is clear and moist and mucous membranes are normal. Mucous membranes are not pale and not cyanotic. No oropharyngeal exudate, posterior oropharyngeal edema, posterior oropharyngeal erythema or tonsillar abscesses.  Eyes: Conjunctivae are normal. Pupils are equal, round, and reactive to light. No scleral icterus.  Neck: Normal range of motion and full passive range of motion without pain. Neck supple.  Cardiovascular: Normal rate, regular rhythm, normal heart sounds and intact distal pulses.   Pulmonary/Chest: Accessory muscle usage present. No stridor. No respiratory distress. She has decreased breath sounds. She has wheezes. She has rhonchi. She exhibits no tenderness and no bony tenderness.  Wheezes and  rhonchi throughout with mildly decreased breath sounds No chest wall tenderness  Abdominal: Soft. Bowel sounds are normal. She exhibits no distension and no mass. There is no tenderness. There is no rebound and no guarding.  Musculoskeletal: Normal range of motion. She exhibits no edema.  Lymphadenopathy:    She has no cervical adenopathy.  Neurological: She is alert and oriented to person, place, and time. She exhibits normal muscle tone. Coordination normal.  Speech is clear and goal oriented Moves extremities without ataxia  Skin: Skin is warm and dry. No rash noted. She is not diaphoretic. No erythema.  Psychiatric: She has a normal mood and affect.    ED Course  Procedures (including critical care time) Labs Review Labs Reviewed  CBC WITH DIFFERENTIAL - Abnormal; Notable for the following:    Monocytes Relative 17 (*)    Monocytes Absolute 1.1 (*)    All other components within normal limits  URINALYSIS, ROUTINE W REFLEX MICROSCOPIC - Abnormal; Notable for the following:    APPearance CLOUDY (*)    All other components within normal limits  POCT I-STAT, CHEM 8 - Abnormal; Notable for the following:    Potassium 3.2 (*)    Glucose, Bld 102 (*)    All other components within normal limits  PRO B NATRIURETIC PEPTIDE  POCT PREGNANCY, URINE   Imaging Review Dg Chest 2 View  08/24/2013   CLINICAL DATA:  Cough and shortness of breath; chest pain  EXAM: CHEST  2 VIEW  COMPARISON:  April 22, 2013.  FINDINGS: Lungs are clear. Heart size and pulmonary vascularity are normal. No adenopathy. There is degenerative change in the thoracic spine. .  IMPRESSION: No edema or consolidation.   Electronically Signed   By: Bretta Bang M.D.   On: 08/24/2013 14:32    EKG Interpretation   None       MDM   1. Asthma exacerbation     Lori Bean presents with Hx and PE consistent with URI and subsequent asthma exacerbation.  Pt CXR negative for acute infiltrate. Patients  symptoms are  consistent with URI, likely viral etiology.  Will give 1 hr neb and re-evaluate.  Pt with persistent mild expiratory wheezing after neb; pt given the option for admission or attempt at a second neb and she requests another neb.    Pt given 2nd 1 hour neb with resolution of wheezing.  Will refill her home meds and encouraged f/u with her PCP.    Patient ambulated in ED with O2 saturations maintained >90, no current signs of respiratory distress. Lung exam improved after nebulizer treatment. Prednisone given in the ED and pt will bd dc with 5 day burst. Pt states they are breathing at baseline. Pt has been instructed to continue using prescribed medications and to speak with PCP about today's exacerbation.   It has been determined that no acute conditions requiring further emergency intervention are present at this time. The patient/guardian have been advised of the diagnosis and plan. We have discussed signs and symptoms that warrant return to the ED, such as changes or worsening in symptoms.   Vital signs are stable at discharge.   BP 155/67  Pulse 98  Temp(Src) 97.7 F (36.5 C) (Oral)  Resp 20  SpO2 99%  LMP 08/03/2013  Patient/guardian has voiced understanding and agreed to follow-up with the PCP or specialist.          Dierdre Forth, PA-C 08/25/13 [phone]

## 2013-08-28 NOTE — ED Provider Notes (Signed)
Medical screening examination/treatment/procedure(s) were performed by non-physician practitioner and as supervising physician I was immediately available for consultation/collaboration.  EKG Interpretation    Date/Time:  Thursday August 24 2013 12:41:13 EST Ventricular Rate:  86 PR Interval:  164 QRS Duration: 80 QT Interval:  389 QTC Calculation: 465 R Axis:   37 Text Interpretation:  Sinus rhythm Abnormal R-wave progression, early transition Borderline T wave abnormalities ED PHYSICIAN INTERPRETATION AVAILABLE IN [facility] Confirmed by TEST, RECORD ([id number]) on 08/26/2013 12:34:33 PM             Ethelda Chick, MD 08/28/13 [phone]

## 2013-09-01 ENCOUNTER — Ambulatory Visit: Payer: Medicaid Other

## 2013-11-01 ENCOUNTER — Emergency Department (HOSPITAL_COMMUNITY)
Admission: EM | Admit: 2013-11-01 | Discharge: 2013-11-01 | Disposition: A | Discharge status: Home / Self Care | Payer: Medicaid Other | Attending: Emergency Medicine | Admitting: Emergency Medicine

## 2013-11-01 ENCOUNTER — Encounter (HOSPITAL_COMMUNITY): Payer: Self-pay | Admitting: Emergency Medicine

## 2013-11-01 DIAGNOSIS — I10 Essential (primary) hypertension: Secondary | ICD-10-CM | POA: Insufficient documentation

## 2013-11-01 DIAGNOSIS — K219 Gastro-esophageal reflux disease without esophagitis: Secondary | ICD-10-CM | POA: Insufficient documentation

## 2013-11-01 DIAGNOSIS — M5432 Sciatica, left side: Secondary | ICD-10-CM

## 2013-11-01 DIAGNOSIS — F411 Generalized anxiety disorder: Secondary | ICD-10-CM | POA: Insufficient documentation

## 2013-11-01 DIAGNOSIS — R011 Cardiac murmur, unspecified: Secondary | ICD-10-CM | POA: Insufficient documentation

## 2013-11-01 DIAGNOSIS — M129 Arthropathy, unspecified: Secondary | ICD-10-CM | POA: Insufficient documentation

## 2013-11-01 DIAGNOSIS — F17200 Nicotine dependence, unspecified, uncomplicated: Secondary | ICD-10-CM | POA: Insufficient documentation

## 2013-11-01 DIAGNOSIS — Z87448 Personal history of other diseases of urinary system: Secondary | ICD-10-CM | POA: Insufficient documentation

## 2013-11-01 DIAGNOSIS — J45909 Unspecified asthma, uncomplicated: Secondary | ICD-10-CM | POA: Insufficient documentation

## 2013-11-01 DIAGNOSIS — Z79899 Other long term (current) drug therapy: Secondary | ICD-10-CM | POA: Insufficient documentation

## 2013-11-01 DIAGNOSIS — Z792 Long term (current) use of antibiotics: Secondary | ICD-10-CM | POA: Insufficient documentation

## 2013-11-01 DIAGNOSIS — F209 Schizophrenia, unspecified: Secondary | ICD-10-CM | POA: Insufficient documentation

## 2013-11-01 DIAGNOSIS — IMO0002 Reserved for concepts with insufficient information to code with codable children: Secondary | ICD-10-CM | POA: Insufficient documentation

## 2013-11-01 DIAGNOSIS — F319 Bipolar disorder, unspecified: Secondary | ICD-10-CM | POA: Insufficient documentation

## 2013-11-01 DIAGNOSIS — I509 Heart failure, unspecified: Secondary | ICD-10-CM | POA: Insufficient documentation

## 2013-11-01 DIAGNOSIS — I209 Angina pectoris, unspecified: Secondary | ICD-10-CM | POA: Insufficient documentation

## 2013-11-01 DIAGNOSIS — M5430 Sciatica, unspecified side: Secondary | ICD-10-CM | POA: Insufficient documentation

## 2013-11-01 DIAGNOSIS — Z9889 Other specified postprocedural states: Secondary | ICD-10-CM | POA: Insufficient documentation

## 2013-11-01 MED ORDER — OXYCODONE-ACETAMINOPHEN 5-325 MG PO TABS: 2.0000 | ORAL_TABLET | ORAL | Status: DC | PRN

## 2013-11-01 MED ORDER — HYDROMORPHONE HCL PF 1 MG/ML IJ SOLN
2.0000 mg | Freq: Once | INTRAMUSCULAR | Status: AC
  Administered 2013-11-01: 2 mg via INTRAMUSCULAR
  Filled 2013-11-01: qty 2

## 2013-11-01 MED ORDER — METHOCARBAMOL 500 MG PO TABS
1000.0000 mg | ORAL_TABLET | Freq: Once | ORAL | Status: AC
  Administered 2013-11-01: 1000 mg via ORAL
  Filled 2013-11-01: qty 2

## 2013-11-01 MED ORDER — METHYLPREDNISOLONE 4 MG PO KIT: PACK | ORAL | Status: DC

## 2013-11-01 MED ORDER — METHOCARBAMOL 750 MG PO TABS
750.0000 mg | ORAL_TABLET | Freq: Four times a day (QID) | ORAL | Status: DC
Start: 2013-11-01 — End: 2014-04-03

## 2013-11-01 NOTE — ED Notes (Signed)
Pt reports she woke up the day before yesterday at 0930 and "I couldn't get up, I couldn't turn over. it was so much pain, and when I could get up I felt a lot of pressure." Pt reports pain going from groin area down to her leg. She also reports similar pain about 2 weeks ago and her "sister is an Therapist, sports" and told her she might have a blood clot in her leg because "it was real hot and red." pts leg color appears WDL, dry skin, pulse palpable and strong.

## 2013-11-01 NOTE — Discharge Instructions (Signed)
Sciatica °Sciatica is pain, weakness, numbness, or tingling along the path of the sciatic nerve. The nerve starts in the lower back and runs down the back of each leg. The nerve controls the muscles in the lower leg and in the back of the knee, while also providing sensation to the back of the thigh, lower leg, and the sole of your foot. Sciatica is a symptom of another medical condition. For instance, nerve damage or certain conditions, such as a herniated disk or bone spur on the spine, pinch or put pressure on the sciatic nerve. This causes the pain, weakness, or other sensations normally associated with sciatica. Generally, sciatica only affects one side of the body. °CAUSES  °· Herniated or slipped disc. °· Degenerative disk disease. °· A pain disorder involving the narrow muscle in the buttocks (piriformis syndrome). °· Pelvic injury or fracture. °· Pregnancy. °· Tumor (rare). °SYMPTOMS  °Symptoms can vary from mild to very severe. The symptoms usually travel from the low back to the buttocks and down the back of the leg. Symptoms can include: °· Mild tingling or dull aches in the lower back, leg, or hip. °· Numbness in the back of the calf or sole of the foot. °· Burning sensations in the lower back, leg, or hip. °· Sharp pains in the lower back, leg, or hip. °· Leg weakness. °· Severe back pain inhibiting movement. °These symptoms may get worse with coughing, sneezing, laughing, or prolonged sitting or standing. Also, being overweight may worsen symptoms. °DIAGNOSIS  °Your caregiver will perform a physical exam to look for common symptoms of sciatica. He or she may ask you to do certain movements or activities that would trigger sciatic nerve pain. Other tests may be performed to find the cause of the sciatica. These may include: °· Blood tests. °· X-rays. °· Imaging tests, such as an MRI or CT scan. °TREATMENT  °Treatment is directed at the cause of the sciatic pain. Sometimes, treatment is not necessary  and the pain and discomfort goes away on its own. If treatment is needed, your caregiver may suggest: °· Over-the-counter medicines to relieve pain. °· Prescription medicines, such as anti-inflammatory medicine, muscle relaxants, or narcotics. °· Applying heat or ice to the painful area. °· Steroid injections to lessen pain, irritation, and inflammation around the nerve. °· Reducing activity during periods of pain. °· Exercising and stretching to strengthen your abdomen and improve flexibility of your spine. Your caregiver may suggest losing weight if the extra weight makes the back pain worse. °· Physical therapy. °· Surgery to eliminate what is pressing or pinching the nerve, such as a bone spur or part of a herniated disk. °HOME CARE INSTRUCTIONS  °· Only take over-the-counter or prescription medicines for pain or discomfort as directed by your caregiver. °· Apply ice to the affected area for 20 minutes, 3 4 times a day for the first 48 72 hours. Then try heat in the same way. °· Exercise, stretch, or perform your usual activities if these do not aggravate your pain. °· Attend physical therapy sessions as directed by your caregiver. °· Keep all follow-up appointments as directed by your caregiver. °· Do not wear high heels or shoes that do not provide proper support. °· Check your mattress to see if it is too soft. A firm mattress may lessen your pain and discomfort. °SEEK IMMEDIATE MEDICAL CARE IF:  °· You lose control of your bowel or bladder (incontinence). °· You have increasing weakness in the lower back,   pelvis, buttocks, or legs. °· You have redness or swelling of your back. °· You have a burning sensation when you urinate. °· You have pain that gets worse when you lie down or awakens you at night. °· Your pain is worse than you have experienced in the past. °· Your pain is lasting longer than 4 weeks. °· You are suddenly losing weight without reason. °MAKE SURE YOU: °· Understand these  instructions. °· Will watch your condition. °· Will get help right away if you are not doing well or get worse. °Document Released: 08/25/2001 Document Revised: 03/01/2012 Document Reviewed: 01/10/2012 °[employer]® Patient Information ©2014 [employer]. ° °

## 2013-11-01 NOTE — ED Provider Notes (Signed)
CSN: [id number]     Arrival date & time 11/01/13  0507 History   First MD Initiated Contact with Patient 11/01/13 [phone]     Chief Complaint  Patient presents with  . Leg Pain     (Consider location/radiation/quality/duration/timing/severity/associated sxs/prior Treatment) HPI 43 year old female presents to emergency room with complaint of left leg pain.  She reports pain started yesterday morning upon awaking.  Pain starts in her lower back extends over her buttocks down to her knee and across her groin.  Pain is worse with movement.  She denies any weakness, numbness, loss of bowel or bladder control.  She's had similar pain in the past posteriorly, but has never had anterior leg pain.  No pain beyond the knee.  She has previous surgical history of discectomy in 2013, reports that she knows she needs further surgery, but is reluctant to go back.  Patient has tried ibuprofen and Percocet at home without improvement in symptoms.  Patient also reports 2 weeks ago she had redness and swelling to the posterior aspect of her left calf.  This resolved after a few days, and has not recurred. Past Medical History  Diagnosis Date  . Asthma   . Hypertension   . Anxiety   . Snoring disorder     Pt stated my boyfriend always wakes me up and tells me to breathe  . Heart murmur   . CHF (congestive heart failure)   . Shortness of breath   . Bronchitis     hx of  . Bipolar affective disorder     Schizophrenia  . Depression   . GERD (gastroesophageal reflux disease)   . Arthritis   . Anginal pain   . Overactive bladder   . Schizophrenia    Past Surgical History  Procedure Laterality Date  . Rotator cuff repair      Right shoulder  . Eye surgery      Metal plate in right eye  . Tubal ligation    . Gallstones reomved    . Lumbar laminectomy/decompression microdiscectomy  04/01/2012    Procedure: LUMBAR LAMINECTOMY/DECOMPRESSION MICRODISCECTOMY 2 LEVELS;  Surgeon: Faythe Ghee, MD;  Location:  [facility];  Service: Neurosurgery;  Laterality: Left;  Lumbar four-five, lumbar five sacral one microdiscectomy   . Back surgery    . Lumbar wound debridement  04/29/2012    Procedure: LUMBAR WOUND DEBRIDEMENT;  Surgeon: Faythe Ghee, MD;  Location: [facility] [facility];  Service: Neurosurgery;  Laterality: N/A;  lumbar wound debridement   No family history on file. History  Substance Use Topics  . Smoking status: Current Every Day Smoker -- 0.25 packs/day for 9 years    Types: Cigarettes  . Smokeless tobacco: Never Used  . Alcohol Use: Yes     Comment: One 40 oz. beer/day average--some tequilla   OB History   Grav Para Term Preterm Abortions TAB SAB Ect Mult Living                 Review of Systems  See History of Present Illness; otherwise all other systems are reviewed and negative   Allergies  Aspirin  Home Medications   Current Outpatient Rx  Name  Route  Sig  Dispense  Refill  . albuterol (PROVENTIL HFA;VENTOLIN HFA) 108 (90 BASE) MCG/ACT inhaler   Inhalation   Inhale 2 puffs into the lungs every 6 (six) hours as needed for wheezing or shortness of breath.         [name] albuterol (  PROVENTIL) (5 MG/ML) 0.5% nebulizer solution   Nebulization   Take 0.5 mLs (2.5 mg total) by nebulization every 4 (four) hours as needed for wheezing or shortness of breath.   20 mL   12   . amLODipine (NORVASC) 10 MG tablet   Oral   Take 1 tablet (10 mg total) by mouth daily.   30 tablet   3   . ARIPiprazole (ABILIFY) 20 MG tablet   Oral   Take 20 mg by mouth at bedtime.         [name] azithromycin (ZITHROMAX) 250 MG tablet   Oral   Take 1 tablet (250 mg total) by mouth daily. Take first 2 tablets together, then 1 every day until finished.   6 tablet   0   . esomeprazole (NEXIUM) 40 MG capsule   Oral   Take 40 mg by mouth daily at 12 noon.         . furosemide (LASIX) 40 MG tablet   Oral   Take 1 tablet (40 mg total) by mouth daily.   30 tablet   3   . gabapentin  (NEURONTIN) 300 MG capsule   Oral   Take 1 capsule (300 mg total) by mouth 2 (two) times daily.   60 capsule   3   . HYDROcodone-acetaminophen (NORCO/VICODIN) 5-325 MG per tablet   Oral   Take 1-2 tablets by mouth every 6 (six) hours as needed.   20 tablet   0   . methocarbamol (ROBAXIN) 500 MG tablet   Oral   Take 1 tablet (500 mg total) by mouth 2 (two) times daily.   20 tablet   0   . predniSONE (DELTASONE) 20 MG tablet   Oral   Take 2 tablets (40 mg total) by mouth daily.   10 tablet   0   . solifenacin (VESICARE) 5 MG tablet   Oral   Take 1 tablet (5 mg total) by mouth daily.   30 tablet   3   . traMADol (ULTRAM) 50 MG tablet   Oral   Take 50 mg by mouth every 6 (six) hours as needed (pain).         . triamcinolone cream (KENALOG) 0.1 %   Topical   Apply 1 application topically 2 (two) times daily as needed (eczema).          BP 152/53  Pulse 102  Temp(Src) 98 F (36.7 C) (Oral)  SpO2 98%  LMP 10/01/2013 Physical Exam  Nursing note and vitals reviewed. Constitutional: She appears distressed.  Uncomfortable appearing obese female  HENT:  Head: Normocephalic and atraumatic.  Nose: Nose normal.  Mouth/Throat: Oropharynx is clear and moist.  Cardiovascular: Normal rate, regular rhythm and intact distal pulses.  Exam reveals no gallop and no friction rub.   No murmur heard. Pulmonary/Chest: Effort normal. No respiratory distress. She has no wheezes. She has no rales. She exhibits no tenderness.  Abdominal: Soft. Bowel sounds are normal. She exhibits no distension and no mass. There is no tenderness. There is no rebound and no guarding.  Musculoskeletal: She exhibits tenderness. She exhibits no edema.  Patient has tenderness to palpation of left buttock, left quad and left posterior upper leg.  She has positive straight leg raise on the left.  Reflexes are normal.  Sensation is intact.  There is no crepitus, overlying skin changes, or deformity noted   Neurological: She has normal reflexes. She exhibits normal muscle tone. Coordination normal.  Skin: Skin  is warm and dry. No rash noted. No erythema. No pallor.    ED Course  Procedures (including critical care time) Labs Review Labs Reviewed - No data to display Imaging Review No results found.  EKG Interpretation   None       MDM   Final diagnoses:  Sciatica of left side    43 year old female with probable sciatica of left leg.  Plan to treat with IM Dilaudid here, Robaxin, and Medrol Dosepak.  She's been advised to follow back up with her neurosurgeon.  No red flags on history or physical noted to suggest emergent MRI need    Kalman Drape, MD 11/01/13 [phone]

## 2013-11-01 NOTE — ED Notes (Signed)
Pt wheeled to waiting room in wheelchair by [facility] NT. A&Ox4.

## 2013-11-12 ENCOUNTER — Emergency Department (HOSPITAL_COMMUNITY)
Admission: EM | Admit: 2013-11-12 | Discharge: 2013-11-12 | Disposition: A | Discharge status: Home / Self Care | Payer: Medicaid Other | Attending: Emergency Medicine | Admitting: Emergency Medicine

## 2013-11-12 ENCOUNTER — Emergency Department (HOSPITAL_COMMUNITY): Payer: Medicaid Other

## 2013-11-12 ENCOUNTER — Encounter (HOSPITAL_COMMUNITY): Payer: Self-pay | Admitting: Emergency Medicine

## 2013-11-12 DIAGNOSIS — Z79899 Other long term (current) drug therapy: Secondary | ICD-10-CM | POA: Insufficient documentation

## 2013-11-12 DIAGNOSIS — I509 Heart failure, unspecified: Secondary | ICD-10-CM | POA: Insufficient documentation

## 2013-11-12 DIAGNOSIS — I209 Angina pectoris, unspecified: Secondary | ICD-10-CM | POA: Insufficient documentation

## 2013-11-12 DIAGNOSIS — R011 Cardiac murmur, unspecified: Secondary | ICD-10-CM | POA: Insufficient documentation

## 2013-11-12 DIAGNOSIS — Z87448 Personal history of other diseases of urinary system: Secondary | ICD-10-CM | POA: Insufficient documentation

## 2013-11-12 DIAGNOSIS — M129 Arthropathy, unspecified: Secondary | ICD-10-CM | POA: Insufficient documentation

## 2013-11-12 DIAGNOSIS — M5430 Sciatica, unspecified side: Secondary | ICD-10-CM

## 2013-11-12 DIAGNOSIS — M79609 Pain in unspecified limb: Secondary | ICD-10-CM

## 2013-11-12 DIAGNOSIS — F17200 Nicotine dependence, unspecified, uncomplicated: Secondary | ICD-10-CM | POA: Insufficient documentation

## 2013-11-12 DIAGNOSIS — J45909 Unspecified asthma, uncomplicated: Secondary | ICD-10-CM | POA: Insufficient documentation

## 2013-11-12 DIAGNOSIS — K219 Gastro-esophageal reflux disease without esophagitis: Secondary | ICD-10-CM | POA: Insufficient documentation

## 2013-11-12 DIAGNOSIS — F319 Bipolar disorder, unspecified: Secondary | ICD-10-CM | POA: Insufficient documentation

## 2013-11-12 DIAGNOSIS — F209 Schizophrenia, unspecified: Secondary | ICD-10-CM | POA: Insufficient documentation

## 2013-11-12 DIAGNOSIS — I10 Essential (primary) hypertension: Secondary | ICD-10-CM | POA: Insufficient documentation

## 2013-11-12 HISTORY — DX: Sciatica, unspecified side: M54.30

## 2013-11-12 LAB — BASIC METABOLIC PANEL
BUN: 15 mg/dL (ref 6–23)
CO2: 28 mEq/L (ref 19–32)
Calcium: 9.3 mg/dL (ref 8.4–10.5)
Chloride: 100 mEq/L (ref 96–112)
Creatinine, Ser: 1.05 mg/dL (ref 0.50–1.10)
GFR calc Af Amer: 74 mL/min — ABNORMAL LOW (ref >90)
GFR calc non Af Amer: 64 mL/min — ABNORMAL LOW (ref >90)
Glucose, Bld: 109 mg/dL — ABNORMAL HIGH (ref 70–99)
Potassium: 3.9 mEq/L (ref 3.7–5.3)
Sodium: 138 mEq/L (ref 137–147)

## 2013-11-12 LAB — CBC WITH DIFFERENTIAL/PLATELET
Basophils Absolute: 0 10*3/uL (ref 0.0–0.1)
Basophils Relative: 0 % (ref 0–1)
Eosinophils Absolute: 0.6 10*3/uL (ref 0.0–0.7)
Eosinophils Relative: 5 % (ref 0–5)
HCT: 39.9 % (ref 36.0–46.0)
Hemoglobin: 13.3 g/dL (ref 12.0–15.0)
Lymphocytes Relative: 27 % (ref 12–46)
Lymphs Abs: 2.9 10*3/uL (ref 0.7–4.0)
MCH: 30.7 pg (ref 26.0–34.0)
MCHC: 33.3 g/dL (ref 30.0–36.0)
MCV: 92.1 fL (ref 78.0–100.0)
Monocytes Absolute: 0.9 10*3/uL (ref 0.1–1.0)
Monocytes Relative: 8 % (ref 3–12)
Neutro Abs: 6.6 10*3/uL (ref 1.7–7.7)
Neutrophils Relative %: 60 % (ref 43–77)
Platelets: 288 10*3/uL (ref 150–400)
RBC: 4.33 MIL/uL (ref 3.87–5.11)
RDW: 13.4 % (ref 11.5–15.5)
WBC: 10.9 10*3/uL — ABNORMAL HIGH (ref 4.0–10.5)

## 2013-11-12 LAB — PRO B NATRIURETIC PEPTIDE: Pro B Natriuretic peptide (BNP): 18.8 pg/mL (ref 0–125)

## 2013-11-12 MED ORDER — SODIUM CHLORIDE 0.9 % IV SOLN
INTRAVENOUS | Status: DC
  Administered 2013-11-12: 12:00:00 via INTRAVENOUS

## 2013-11-12 MED ORDER — HYDROMORPHONE HCL PF 1 MG/ML IJ SOLN
1.0000 mg | Freq: Once | INTRAMUSCULAR | Status: AC
  Administered 2013-11-12: 1 mg via INTRAVENOUS
  Filled 2013-11-12: qty 1

## 2013-11-12 MED ORDER — ONDANSETRON HCL 4 MG/2ML IJ SOLN
4.0000 mg | Freq: Once | INTRAMUSCULAR | Status: AC
  Administered 2013-11-12: 4 mg via INTRAVENOUS
  Filled 2013-11-12: qty 2

## 2013-11-12 MED ORDER — HYDROCODONE-ACETAMINOPHEN 10-300 MG PO TABS: 1.0000 | ORAL_TABLET | Freq: Four times a day (QID) | ORAL | Status: DC | PRN

## 2013-11-12 MED ORDER — KETOROLAC TROMETHAMINE 30 MG/ML IJ SOLN
30.0000 mg | Freq: Once | INTRAMUSCULAR | Status: AC
  Administered 2013-11-12: 30 mg via INTRAVENOUS
  Filled 2013-11-12: qty 1

## 2013-11-12 MED ORDER — PREDNISONE 10 MG PO TABS: 20.0000 mg | ORAL_TABLET | Freq: Every day | ORAL | Status: DC

## 2013-11-12 MED ORDER — METHOCARBAMOL 500 MG PO TABS: 500.0000 mg | ORAL_TABLET | Freq: Two times a day (BID) | ORAL | Status: DC

## 2013-11-12 MED ORDER — DEXAMETHASONE SODIUM PHOSPHATE 10 MG/ML IJ SOLN
10.0000 mg | Freq: Once | INTRAMUSCULAR | Status: AC
  Administered 2013-11-12: 10 mg via INTRAVENOUS
  Filled 2013-11-12: qty 1

## 2013-11-12 NOTE — ED Provider Notes (Signed)
CSN: [id number]     Arrival date & time 11/12/13  1116 History   First MD Initiated Contact with Patient 11/12/13 1155     Chief Complaint  Patient presents with  . Leg Pain     (Consider location/radiation/quality/duration/timing/severity/associated sxs/prior Treatment) Patient is a 43 y.o. female presenting with leg pain.  Leg Pain   Lori Bean is a 43 y.o.female with a significant PMH of asthma, hypertension, anxiety, CHF, SOB, bipolar, GERD, depression, anginal pain, schizophrenia, sciatica presents to the ER with complaints of left leg pain that is severe. She has a history of chronic back pain, has had surgery in the past and reports she needs one more. She was seen two weeks ago for this pain, she reports she was doing much better until her leg acutely became swollen and painful, just on the left side. She says that she feels a knot behind her knee that is tender. She is having difficulty putting any pressure on this leg. She endorses having shooting pains in her chest and SOB, which this is not new but it is worse than normal. She denies fever, nausea, vomiting diarrhea, weakness, confusion.   Past Medical History  Diagnosis Date  . Asthma   . Hypertension   . Anxiety   . Snoring disorder     Pt stated my boyfriend always wakes me up and tells me to breathe  . Heart murmur   . CHF (congestive heart failure)   . Shortness of breath   . Bronchitis     hx of  . Bipolar affective disorder     Schizophrenia  . Depression   . GERD (gastroesophageal reflux disease)   . Arthritis   . Anginal pain   . Overactive bladder   . Schizophrenia   . Sciatica    Past Surgical History  Procedure Laterality Date  . Rotator cuff repair      Right shoulder  . Eye surgery      Metal plate in right eye  . Tubal ligation    . Gallstones reomved    . Lumbar laminectomy/decompression microdiscectomy  04/01/2012    Procedure: LUMBAR LAMINECTOMY/DECOMPRESSION MICRODISCECTOMY 2  LEVELS;  Surgeon: Faythe Ghee, MD;  Location: [facility];  Service: Neurosurgery;  Laterality: Left;  Lumbar four-five, lumbar five sacral one microdiscectomy   . Back surgery    . Lumbar wound debridement  04/29/2012    Procedure: LUMBAR WOUND DEBRIDEMENT;  Surgeon: Faythe Ghee, MD;  Location: [facility];  Service: Neurosurgery;  Laterality: N/A;  lumbar wound debridement   History reviewed. No pertinent family history. History  Substance Use Topics  . Smoking status: Current Every Day Smoker -- 0.25 packs/day for 9 years    Types: Cigarettes  . Smokeless tobacco: Never Used  . Alcohol Use: Yes     Comment: One 40 oz. beer/day average--some tequilla   OB History   Grav Para Term Preterm Abortions TAB SAB Ect Mult Living                 Review of Systems  The patient denies anorexia, fever, weight loss,, vision loss, decreased hearing, hoarseness,  syncope, balance deficits, hemoptysis, abdominal pain, melena, hematochezia, severe indigestion/heartburn, hematuria, incontinence, genital sores, muscle weakness, suspicious skin lesions, transient blindness, depression, unusual weight change, abnormal bleeding, enlarged lymph nodes, angioedema, and breast masses.   Allergies  Aspirin  Home Medications   Current Outpatient Rx  Name  Route  Sig  Dispense  Refill  . albuterol (PROVENTIL HFA;VENTOLIN HFA) 108 (90 BASE) MCG/ACT inhaler   Inhalation   Inhale 2 puffs into the lungs every 6 (six) hours as needed for wheezing or shortness of breath.         [name] albuterol (PROVENTIL) (5 MG/ML) 0.5% nebulizer solution   Nebulization   Take 0.5 mLs (2.5 mg total) by nebulization every 4 (four) hours as needed for wheezing or shortness of breath.   20 mL   12   . amLODipine (NORVASC) 10 MG tablet   Oral   Take 1 tablet (10 mg total) by mouth daily.   30 tablet   3   . ARIPiprazole (ABILIFY) 20 MG tablet   Oral   Take 20 mg by mouth at bedtime.         [name] esomeprazole  (NEXIUM) 40 MG capsule   Oral   Take 40 mg by mouth daily at 12 noon.         . furosemide (LASIX) 40 MG tablet   Oral   Take 1 tablet (40 mg total) by mouth daily.   30 tablet   3   . gabapentin (NEURONTIN) 300 MG capsule   Oral   Take 1 capsule (300 mg total) by mouth 2 (two) times daily.   60 capsule   3   . methocarbamol (ROBAXIN-750) 750 MG tablet   Oral   Take 1 tablet (750 mg total) by mouth 4 (four) times daily.   40 tablet   0   . oxyCODONE-acetaminophen (PERCOCET/ROXICET) 5-325 MG per tablet   Oral   Take 2 tablets by mouth every 4 (four) hours as needed for severe pain.   20 tablet   0   . solifenacin (VESICARE) 5 MG tablet   Oral   Take 1 tablet (5 mg total) by mouth daily.   30 tablet   3   . traMADol (ULTRAM) 50 MG tablet   Oral   Take 50 mg by mouth every 6 (six) hours as needed (pain).         . triamcinolone cream (KENALOG) 0.1 %   Topical   Apply 1 application topically 2 (two) times daily as needed (eczema).          BP 175/91  Pulse 91  Temp(Src) 98.2 F (36.8 C) (Oral)  Resp 18  SpO2 99%  LMP 11/02/2013 Physical Exam  Nursing note and vitals reviewed. Constitutional: She appears well-developed and well-nourished. She appears distressed (pt is tearful due to pain).  HENT:  Head: Normocephalic and atraumatic.  Eyes: Pupils are equal, round, and reactive to light.  Neck: Normal range of motion. Neck supple.  Cardiovascular: Normal rate and regular rhythm.   Pulmonary/Chest: Effort normal. She has no wheezes. She has no rhonchi. She has no rales.  Increased effort in breathing  Abdominal: Soft.  Musculoskeletal:  abnormal gait due to pain in left leg. Noticeable swelling to left leg from thigh to ankle. She has tenderness to posterior knee, up through her left hip.   CR < 2 seconds to the left toes, her pedal and femoral pulses are palpable and strong. Sensations are intact.  Neurological: She is alert.  Skin: Skin is warm and  dry.    ED Course  Procedures (including critical care time) Labs Review Labs Reviewed  CBC WITH DIFFERENTIAL - Abnormal; Notable for the following:    WBC 10.9 (*)    All other components within normal limits  BASIC METABOLIC PANEL -  Abnormal; Notable for the following:    Glucose, Bld 109 (*)    GFR calc non Af Amer 64 (*)    GFR calc Af Amer 74 (*)    All other components within normal limits  PRO B NATRIURETIC PEPTIDE   Imaging Review Dg Chest 2 View  11/12/2013   CLINICAL DATA:  Severe right lower leg pain with numbness and weakness, chest pain and shortness of breath  EXAM: CHEST  2 VIEW  COMPARISON:  DG CHEST 2 VIEW dated 08/24/2013; DG CHEST 2 VIEW dated 04/22/2013; DG CHEST 2 VIEW dated 05/08/2012; CT ANGIO CHEST W/CM &/OR WO/CM dated 05/08/2012  FINDINGS: Grossly unchanged borderline enlarged cardiac silhouette. Unchanged left basilar such retrocardiac opacities favored to represent atelectasis or scar. No new focal airspace opacities. No pleural effusion or pneumothorax. No definite evidence of edema. No acute osseus abnormalities. Post cholecystectomy.  IMPRESSION: Left basilar atelectasis without acute cardiopulmonary disease.   Electronically Signed   By: Sandi Mariscal M.D.   On: 11/12/2013 13:50     EKG Interpretation None      MDM   Final diagnoses:  Sciatica     Filed: 11/12/2013 12:29 PM Note Time: 11/12/2013 12:29 PM Status: Signed   Editor: Iantha Fallen, RVS (Cardiovascular Sonographer)      VASCULAR LAB  PRELIMINARY PRELIMINARY PRELIMINARY PRELIMINARY  Left lower extremity venous Doppler completed.  Preliminary report: There is no DVT or SVT noted in the left lower extremity.  KANADY, CANDACE, RVT  11/12/2013, 12:29 PM        Patient has received Decadron and a  Total of 2mg  IV Dilaudid and reports her pain has gone from a 10/10 to an 8/10. She has no DVT and is not having a CHF exacerbation. Now that her pain is starting to go down she describes no longer  having SOB.   Patient is to see her neurosurgeon on March 9, will try to control pain best as  ican and discharge her with Robaxin, medrol dose pack and Vicodin.  43 y.o.Lori Bean's evaluation in the Emergency Department is complete. It has been determined that no acute conditions requiring further emergency intervention are present at this time. The patient/guardian have been advised of the diagnosis and plan. We have discussed signs and symptoms that warrant return to the ED, such as changes or worsening in symptoms.  Vital signs are stable at discharge. Filed Vitals:   11/12/13 1421  BP: 175/91  Pulse: 91  Temp:   Resp: 18    Patient/guardian has voiced understanding and agreed to follow-up with the PCP or specialist.    Linus Mako, PA-C 11/12/13 1511

## 2013-11-12 NOTE — ED Notes (Signed)
Pt transported to xray 

## 2013-11-12 NOTE — ED Provider Notes (Signed)
Medical screening examination/treatment/procedure(s) were performed by non-physician practitioner and as supervising physician I was immediately available for consultation/collaboration.  Abygail Galeno L Kowen Kluth, MD 11/12/13 1952 

## 2013-11-12 NOTE — ED Notes (Signed)
Tiffany PA at bedside  

## 2013-11-12 NOTE — ED Notes (Signed)
Vascular lab at bedside to perform ultrasound.

## 2013-11-12 NOTE — Progress Notes (Signed)
VASCULAR LAB PRELIMINARY  PRELIMINARY  PRELIMINARY  PRELIMINARY  Left lower extremity venous Doppler completed.    Preliminary report:  There is no DVT or SVT noted in the left lower extremity.   Wilfred Siverson, RVT 11/12/2013, 12:29 PM

## 2013-11-12 NOTE — ED Notes (Signed)
Pt states pain and swelling to L leg. Ongoing for several weeks. States pain became worse today. Able to wiggle digits. Pedal pulses present. 10/10 pain upon arrival, pain increases with ambulation and weight bearing. Pt is alert and oriented x4.

## 2013-11-12 NOTE — ED Notes (Signed)
Pt states "my sciatica has flared up." states she woke this am with severe lower back pain radiating down L leg. States it hurts to move

## 2013-11-12 NOTE — ED Notes (Signed)
Pt states no relief of L leg pain with medication. 10/10 pain at the time. Vital signs stable. No signs of distress noted.

## 2013-11-12 NOTE — Discharge Instructions (Signed)
Sciatica °Sciatica is pain, weakness, numbness, or tingling along the path of the sciatic nerve. The nerve starts in the lower back and runs down the back of each leg. The nerve controls the muscles in the lower leg and in the back of the knee, while also providing sensation to the back of the thigh, lower leg, and the sole of your foot. Sciatica is a symptom of another medical condition. For instance, nerve damage or certain conditions, such as a herniated disk or bone spur on the spine, pinch or put pressure on the sciatic nerve. This causes the pain, weakness, or other sensations normally associated with sciatica. Generally, sciatica only affects one side of the body. °CAUSES  °· Herniated or slipped disc. °· Degenerative disk disease. °· A pain disorder involving the narrow muscle in the buttocks (piriformis syndrome). °· Pelvic injury or fracture. °· Pregnancy. °· Tumor (rare). °SYMPTOMS  °Symptoms can vary from mild to very severe. The symptoms usually travel from the low back to the buttocks and down the back of the leg. Symptoms can include: °· Mild tingling or dull aches in the lower back, leg, or hip. °· Numbness in the back of the calf or sole of the foot. °· Burning sensations in the lower back, leg, or hip. °· Sharp pains in the lower back, leg, or hip. °· Leg weakness. °· Severe back pain inhibiting movement. °These symptoms may get worse with coughing, sneezing, laughing, or prolonged sitting or standing. Also, being overweight may worsen symptoms. °DIAGNOSIS  °Your caregiver will perform a physical exam to look for common symptoms of sciatica. He or she may ask you to do certain movements or activities that would trigger sciatic nerve pain. Other tests may be performed to find the cause of the sciatica. These may include: °· Blood tests. °· X-rays. °· Imaging tests, such as an MRI or CT scan. °TREATMENT  °Treatment is directed at the cause of the sciatic pain. Sometimes, treatment is not necessary  and the pain and discomfort goes away on its own. If treatment is needed, your caregiver may suggest: °· Over-the-counter medicines to relieve pain. °· Prescription medicines, such as anti-inflammatory medicine, muscle relaxants, or narcotics. °· Applying heat or ice to the painful area. °· Steroid injections to lessen pain, irritation, and inflammation around the nerve. °· Reducing activity during periods of pain. °· Exercising and stretching to strengthen your abdomen and improve flexibility of your spine. Your caregiver may suggest losing weight if the extra weight makes the back pain worse. °· Physical therapy. °· Surgery to eliminate what is pressing or pinching the nerve, such as a bone spur or part of a herniated disk. °HOME CARE INSTRUCTIONS  °· Only take over-the-counter or prescription medicines for pain or discomfort as directed by your caregiver. °· Apply ice to the affected area for 20 minutes, 3 4 times a day for the first 48 72 hours. Then try heat in the same way. °· Exercise, stretch, or perform your usual activities if these do not aggravate your pain. °· Attend physical therapy sessions as directed by your caregiver. °· Keep all follow-up appointments as directed by your caregiver. °· Do not wear high heels or shoes that do not provide proper support. °· Check your mattress to see if it is too soft. A firm mattress may lessen your pain and discomfort. °SEEK IMMEDIATE MEDICAL CARE IF:  °· You lose control of your bowel or bladder (incontinence). °· You have increasing weakness in the lower back,   pelvis, buttocks, or legs. °· You have redness or swelling of your back. °· You have a burning sensation when you urinate. °· You have pain that gets worse when you lie down or awakens you at night. °· Your pain is worse than you have experienced in the past. °· Your pain is lasting longer than 4 weeks. °· You are suddenly losing weight without reason. °MAKE SURE YOU: °· Understand these  instructions. °· Will watch your condition. °· Will get help right away if you are not doing well or get worse. °Document Released: 08/25/2001 Document Revised: 03/01/2012 Document Reviewed: 01/10/2012 °[employer]® Patient Information ©2014 [employer]. ° °

## 2013-11-12 NOTE — ED Notes (Signed)
Pt vomiting. Medicated for nausea/vomiting. She is resting quietly in the room. NPO at the time. Vital signs stable. No signs of acute distress noted.

## 2013-11-27 ENCOUNTER — Other Ambulatory Visit: Payer: Self-pay | Admitting: Neurosurgery

## 2013-11-27 DIAGNOSIS — M549 Dorsalgia, unspecified: Secondary | ICD-10-CM

## 2013-11-29 ENCOUNTER — Ambulatory Visit
Admission: RE | Admit: 2013-11-29 | Discharge: 2013-11-29 | Disposition: A | Discharge status: Home / Self Care | Payer: No Typology Code available for payment source | Source: Ambulatory Visit | Attending: Neurosurgery | Admitting: Neurosurgery

## 2013-11-29 VITALS — BP 155/94 | HR 82

## 2013-11-29 DIAGNOSIS — M549 Dorsalgia, unspecified: Secondary | ICD-10-CM

## 2013-11-29 MED ORDER — DIAZEPAM 5 MG PO TABS
10.0000 mg | ORAL_TABLET | Freq: Once | ORAL | Status: AC
  Administered 2013-11-29: 10 mg via ORAL

## 2013-11-29 MED ORDER — IOHEXOL 180 MG/ML  SOLN
18.0000 mL | Freq: Once | INTRAMUSCULAR | Status: AC | PRN
  Administered 2013-11-29: 18 mL via INTRATHECAL

## 2013-11-29 MED ORDER — ONDANSETRON HCL 4 MG/2ML IJ SOLN
4.0000 mg | Freq: Once | INTRAMUSCULAR | Status: AC
  Administered 2013-11-29: 4 mg via INTRAMUSCULAR

## 2013-11-29 MED ORDER — MEPERIDINE HCL 100 MG/ML IJ SOLN
100.0000 mg | Freq: Once | INTRAMUSCULAR | Status: AC
Start: 2013-11-29 — End: 2013-11-29
  Administered 2013-11-29: 100 mg via INTRAMUSCULAR

## 2013-11-29 NOTE — Discharge Instructions (Signed)
Myelogram Discharge Instructions  1. Go home and rest quietly for the next 24 hours.  It is important to lie flat for the next 24 hours.  Get up only to go to the restroom.  You may lie in the bed or on a couch on your back, your stomach, your left side or your right side.  You may have one pillow under your head.  You may have pillows between your knees while you are on your side or under your knees while you are on your back.  2. DO NOT drive today.  Recline the seat as far back as it will go, while still wearing your seat belt, on the way home.  3. You may get up to go to the bathroom as needed.  You may sit up for 10 minutes to eat.  You may resume your normal diet and medications unless otherwise indicated.  Drink lots of extra fluids today and tomorrow.  4. The incidence of headache, nausea, or vomiting is about 5% (one in 20 patients).  If you develop a headache, lie flat and drink plenty of fluids until the headache goes away.  Caffeinated beverages may be helpful.  If you develop severe nausea and vomiting or a headache that does not go away with flat bed rest, call [phone].  5. You may resume normal activities after your 24 hours of bed rest is over; however, do not exert yourself strongly or do any heavy lifting tomorrow. If when you get up you have a headache when standing, go back to bed and force fluids for another 24 hours.  6. Call your physician for a follow-up appointment.  The results of your myelogram will be sent directly to your physician by the following day.  7. If you have any questions or if complications develop after you arrive home, please call [phone].  Discharge instructions have been explained to the patient.  The patient, or the person responsible for the patient, fully understands these instructions.      May resume abilify and tramadol on November 30, 2013, after 1:00 pm.

## 2013-11-29 NOTE — Progress Notes (Signed)
Patient states she has been off Abilify and Tramadol for at least the past days.  jkl

## 2013-12-01 ENCOUNTER — Other Ambulatory Visit: Payer: Self-pay | Admitting: Neurosurgery

## 2013-12-18 NOTE — Pre-Procedure Instructions (Signed)
Lori Bean  12/18/2013   Your procedure is scheduled on:   Thursday  12/28/13   Report to [facility]  2 * 3 at 530 AM.  Call this number if you have problems the morning of surgery: [phone]   Remember:   Do not eat food or drink liquids after midnight.   Take these medicines the morning of surgery with A SIP OF WATER:inhalers,nebulizer, amlodipine,nexium,gabapentin,pain med if needed, prednisone        STOP all herbel meds, nsaids (aleve,naproxen,advil,ibuprofen) 5 days prior to surgery including vitamins, aspirin. Do not  stop other rx meds unless ordered by dr   Lazaro Arms not wear jewelry, make-up or nail polish.  Do not wear lotions, powders, or perfumes. You may wear deodorant.  Do not shave 48 hours prior to surgery. Men may shave face and neck.  Do not bring valuables to the hospital.  [facility] is not responsible                  for any belongings or valuables.               Contacts, dentures or bridgework may not be worn into surgery.  Leave suitcase in the car. After surgery it may be brought to your room.  For patients admitted to the hospital, discharge time is determined by your                treatment team.               Patients discharged the day of surgery will not be allowed to drive  home.  Name and phone number of your driver:   Special Instructions:   Special Instructions: [place] - Preparing for Surgery  Before surgery, you can play an important role.  Because skin is not sterile, your skin needs to be as free of germs as possible.  You can reduce the number of germs on you skin by washing with CHG (chlorahexidine gluconate) soap before surgery.  CHG is an antiseptic cleaner which kills germs and bonds with the skin to continue killing germs even after washing.  Please DO NOT use if you have an allergy to CHG or antibacterial soaps.  If your skin becomes reddened/irritated stop using the CHG and inform your nurse when you  arrive at Short Stay.  Do not shave (including legs and underarms) for at least 48 hours prior to the first CHG shower.  You may shave your face.  Please follow these instructions carefully:   1.  Shower with CHG Soap the night before surgery and the morning of Surgery.  2.  If you choose to wash your hair, wash your hair first as usual with your normal shampoo.  3.  After you shampoo, rinse your hair and body thoroughly to remove the Shampoo.  4.  Use CHG as you would any other liquid soap. You can apply chg directly to the skin and wash gently with scrungie or a clean washcloth.  5.  Apply the CHG Soap to your body ONLY FROM THE NECK DOWN.  Do not use on open wounds or open sores.  Avoid contact with your eyes, ears, mouth and genitals (private parts).  Wash genitals (private parts with your normal soap.  6.  Wash thoroughly, paying special attention to the area where your surgery will be performed.  7.  Thoroughly rinse your body with warm water from the neck down.  8.  DO NOT shower/wash with your normal soap after using and rinsing off the CHG Soap.  9.  Pat yourself dry with a clean towel.            10.  Wear clean pajamas.            11.  Place clean sheets on your bed the night of your first shower and do not sleep with pets.  Day of Surgery  Do not apply any lotions/deodorants the morning of surgery.  Please wear clean clothes to the hospital/surgery center.  Please read over the following fact sheets that you were given: Pain Booklet, Coughing and Deep Breathing, Blood Transfusion Information, MRSA Information and Surgical Site Infection Prevention

## 2013-12-19 ENCOUNTER — Encounter (HOSPITAL_COMMUNITY): Payer: Self-pay

## 2013-12-19 ENCOUNTER — Encounter (HOSPITAL_COMMUNITY)
Admission: RE | Admit: 2013-12-19 | Discharge: 2013-12-19 | Disposition: A | Discharge status: Home / Self Care | Payer: Medicaid Other | Source: Ambulatory Visit | Attending: Neurosurgery | Admitting: Neurosurgery

## 2013-12-19 DIAGNOSIS — Z01812 Encounter for preprocedural laboratory examination: Secondary | ICD-10-CM | POA: Insufficient documentation

## 2013-12-19 LAB — HCG, SERUM, QUALITATIVE: Preg, Serum: NEGATIVE

## 2013-12-19 LAB — TYPE AND SCREEN
ABO/RH(D): A POS
Antibody Screen: NEGATIVE

## 2013-12-19 LAB — ABO/RH: ABO/RH(D): A POS

## 2013-12-19 LAB — CBC
HCT: 39.9 % (ref 36.0–46.0)
Hemoglobin: 13.6 g/dL (ref 12.0–15.0)
MCH: 31.1 pg (ref 26.0–34.0)
MCHC: 34.1 g/dL (ref 30.0–36.0)
MCV: 91.1 fL (ref 78.0–100.0)
Platelets: 271 10*3/uL (ref 150–400)
RBC: 4.38 MIL/uL (ref 3.87–5.11)
RDW: 12.9 % (ref 11.5–15.5)
WBC: 11.3 10*3/uL — ABNORMAL HIGH (ref 4.0–10.5)

## 2013-12-19 LAB — BASIC METABOLIC PANEL
BUN: 13 mg/dL (ref 6–23)
CO2: 24 mEq/L (ref 19–32)
Calcium: 9.2 mg/dL (ref 8.4–10.5)
Chloride: 104 mEq/L (ref 96–112)
Creatinine, Ser: 0.81 mg/dL (ref 0.50–1.10)
GFR calc Af Amer: >90 mL/min (ref >90)
GFR calc non Af Amer: 88 mL/min — ABNORMAL LOW (ref >90)
Glucose, Bld: 98 mg/dL (ref 70–99)
Potassium: 4.4 mEq/L (ref 3.7–5.3)
Sodium: 141 mEq/L (ref 137–147)

## 2013-12-19 LAB — SURGICAL PCR SCREEN
MRSA, PCR: NEGATIVE
Staphylococcus aureus: NEGATIVE

## 2013-12-19 NOTE — Progress Notes (Signed)
12/19/13 0819  OBSTRUCTIVE SLEEP APNEA  Have you ever been diagnosed with sleep apnea through a sleep study? No  Do you snore loudly (loud enough to be heard through closed doors)?  1  Do you often feel tired, fatigued, or sleepy during the daytime? 1  Has anyone observed you stop breathing during your sleep? 1  Do you have, or are you being treated for high blood pressure? 1  BMI more than 35 kg/m2? 1  Age over 44 years old? 0  Gender: 0  Obstructive Sleep Apnea Score 5  Score 4 or greater  Results sent to PCP

## 2013-12-19 NOTE — Progress Notes (Signed)
req'd notes, any tests from [facility]. Per patient ot seen in 4 yrs.  [employer], [employer] called re: home situation-no heat ,water, lites. Need place after surgery.will see after admitted per social. Ebony Hail pa consulted,

## 2013-12-19 NOTE — Progress Notes (Signed)
Anesthesia PAT Evaluation: Patient is a 43 year old female posted for one level lumbar fusion on 12/28/13 by Dr. Hal Neer.  History includes smoking, asthma, chronic SOB, snoring disorder, GERD, Bipolar disorder, Schizophrenia, heart murmur (not specified), CHF (I reviewed her records in Epic and found previous admissions for asthma exacerbations, but no specific diagnosis for CHF), chest tightness associated with asthma exacerbations, anxiety, arthritis, overactive bladder, tubal ligation, cholecystectomy, right rotator cuff repair, left L4-S1 laminotomy 0/78/67 complicated by wound infection requiring I&D 04/29/12. BMI is 41 consistent with morbid obesity. PCP is Dr. Angelica Chessman at [facility], one visit in 05/2013 with Dr. Leisa Lenz. She was referred to pulmonologist Dr. Melvyn Novas by request of anesthesia prior to her last back surgery in 2013.  He started her on PPI therapy and Dulera at that time. She is no longer on Dulera. She had normal spirometry at that time. She has not gone back for follow-up.  Meds: Abilify, albuterol PRN, amlodipine, Nexium, Lasix, Neurontin, Robaxin, Percocet, Vesicare. She takes albuterol fairly consistently every 4 hours while awake.   She reports chronic SOB at rest which she feels became worse about one month ago initially when she had cold-like symptoms. She thinks allergies may be a triggering now. Breathing has been stable since her CXR last month.  She continues to have a persistent dry cough. No fever. She is not very active.  She has had no chest pain outside of an acute asthma exacerbation.  She has chronic BLE edema.  Heart sounds RRR, no murmur noted.  Lungs clear without wheezing or rhonchi.  She has 2+ pedal edema.    EKG on 08/24/13 showed SR, poor r wave progression, non-specific T wave abnormality. She has multiple prior EKGs and overall, I think her EKG is stable.  CXR on 11/12/13 showed left basilar atelectasis without acute cardiopulmonary  disease.  Preoperative labs noted.    I reviewed above with anesthesiologist Dr. Chriss Driver.  Since O2 sats are good, normal spirometry in 2013, lung sounds are normal, and she is afebrile will plan to re-evaluate on the day of surgery to ensure no acute changes. Patient is aware that her AM surgery meds should include Nexium and her albuterol.  I did encourage her to seek additional medical evaluation pre-operatively if her pulmonary symptoms worsen, because any new changes would likely lead to a need to reschedule her procedure.  [facility] Short Stay Center/Anesthesiology Phone [phone] 12/19/2013 2:29 PM

## 2013-12-27 MED ORDER — CEFAZOLIN SODIUM-DEXTROSE 2-3 GM-% IV SOLR
2.0000 g | INTRAVENOUS | Status: AC
  Administered 2013-12-28 (×2): 2 g via INTRAVENOUS
  Filled 2013-12-27: qty 50

## 2013-12-27 MED ORDER — DEXAMETHASONE SODIUM PHOSPHATE 10 MG/ML IJ SOLN
10.0000 mg | INTRAMUSCULAR | Status: AC
  Administered 2013-12-28: 10 mg via INTRAVENOUS
  Filled 2013-12-27: qty 1

## 2013-12-28 ENCOUNTER — Inpatient Hospital Stay (HOSPITAL_COMMUNITY)
Admission: RE | Admit: 2013-12-28 | Discharge: 2014-01-02 | DRG: 460 | Disposition: A | Discharge status: Skilled Nursing Facility | Payer: Medicaid Other | Source: Ambulatory Visit | Attending: Neurosurgery | Admitting: Neurosurgery

## 2013-12-28 ENCOUNTER — Encounter (HOSPITAL_COMMUNITY): Payer: Self-pay | Admitting: Anesthesiology

## 2013-12-28 ENCOUNTER — Encounter (HOSPITAL_COMMUNITY): Payer: Medicaid Other | Admitting: Vascular Surgery

## 2013-12-28 ENCOUNTER — Inpatient Hospital Stay (HOSPITAL_COMMUNITY): Payer: Medicaid Other

## 2013-12-28 ENCOUNTER — Inpatient Hospital Stay (HOSPITAL_COMMUNITY): Payer: Medicaid Other | Admitting: Anesthesiology

## 2013-12-28 ENCOUNTER — Encounter (HOSPITAL_COMMUNITY)
Admission: RE | Disposition: A | Discharge status: Skilled Nursing Facility | Payer: Self-pay | Source: Ambulatory Visit | Attending: Neurosurgery

## 2013-12-28 DIAGNOSIS — F209 Schizophrenia, unspecified: Secondary | ICD-10-CM | POA: Diagnosis present

## 2013-12-28 DIAGNOSIS — K219 Gastro-esophageal reflux disease without esophagitis: Secondary | ICD-10-CM | POA: Diagnosis present

## 2013-12-28 DIAGNOSIS — J45909 Unspecified asthma, uncomplicated: Secondary | ICD-10-CM | POA: Diagnosis present

## 2013-12-28 DIAGNOSIS — M47817 Spondylosis without myelopathy or radiculopathy, lumbosacral region: Principal | ICD-10-CM | POA: Diagnosis present

## 2013-12-28 DIAGNOSIS — F411 Generalized anxiety disorder: Secondary | ICD-10-CM | POA: Diagnosis present

## 2013-12-28 DIAGNOSIS — F17200 Nicotine dependence, unspecified, uncomplicated: Secondary | ICD-10-CM | POA: Diagnosis present

## 2013-12-28 DIAGNOSIS — M47816 Spondylosis without myelopathy or radiculopathy, lumbar region: Secondary | ICD-10-CM | POA: Diagnosis present

## 2013-12-28 DIAGNOSIS — I10 Essential (primary) hypertension: Secondary | ICD-10-CM | POA: Diagnosis present

## 2013-12-28 DIAGNOSIS — F319 Bipolar disorder, unspecified: Secondary | ICD-10-CM | POA: Diagnosis present

## 2013-12-28 DIAGNOSIS — Z6841 Body Mass Index (BMI) 40.0 and over, adult: Secondary | ICD-10-CM

## 2013-12-28 DIAGNOSIS — I509 Heart failure, unspecified: Secondary | ICD-10-CM | POA: Diagnosis present

## 2013-12-28 DIAGNOSIS — Z79899 Other long term (current) drug therapy: Secondary | ICD-10-CM

## 2013-12-28 DIAGNOSIS — G4730 Sleep apnea, unspecified: Secondary | ICD-10-CM | POA: Diagnosis present

## 2013-12-28 DIAGNOSIS — K5900 Constipation, unspecified: Secondary | ICD-10-CM | POA: Diagnosis present

## 2013-12-28 HISTORY — PX: POSTERIOR FUSION LUMBAR SPINE: SUR632

## 2013-12-28 SURGERY — POSTERIOR LUMBAR FUSION 1 LEVEL
Anesthesia: General | Site: Back

## 2013-12-28 MED ORDER — GLYCOPYRROLATE 0.2 MG/ML IJ SOLN
INTRAMUSCULAR | Status: AC
  Filled 2013-12-28: qty 3

## 2013-12-28 MED ORDER — BUPIVACAINE HCL (PF) 0.5 % IJ SOLN
INTRAMUSCULAR | Status: DC | PRN
  Administered 2013-12-28: 20 mL

## 2013-12-28 MED ORDER — SUCCINYLCHOLINE CHLORIDE 20 MG/ML IJ SOLN
INTRAMUSCULAR | Status: DC | PRN
  Administered 2013-12-28: 100 mg via INTRAVENOUS

## 2013-12-28 MED ORDER — FUROSEMIDE 40 MG PO TABS
40.0000 mg | ORAL_TABLET | Freq: Every day | ORAL | Status: DC
  Administered 2013-12-28 – 2014-01-02 (×6): 40 mg via ORAL
  Filled 2013-12-28 (×6): qty 1

## 2013-12-28 MED ORDER — ALBUTEROL SULFATE HFA 108 (90 BASE) MCG/ACT IN AERS: 2.0000 | INHALATION_SPRAY | Freq: Four times a day (QID) | RESPIRATORY_TRACT | Status: DC | PRN

## 2013-12-28 MED ORDER — SUCCINYLCHOLINE CHLORIDE 20 MG/ML IJ SOLN
INTRAMUSCULAR | Status: AC
  Filled 2013-12-28: qty 1

## 2013-12-28 MED ORDER — SODIUM CHLORIDE 0.9 % IJ SOLN
3.0000 mL | Freq: Two times a day (BID) | INTRAMUSCULAR | Status: DC
  Administered 2013-12-28 – 2014-01-01 (×8): 3 mL via INTRAVENOUS

## 2013-12-28 MED ORDER — PHENYLEPHRINE HCL 10 MG/ML IJ SOLN
INTRAMUSCULAR | Status: AC
  Filled 2013-12-28: qty 1

## 2013-12-28 MED ORDER — AMLODIPINE BESYLATE 10 MG PO TABS
10.0000 mg | ORAL_TABLET | Freq: Every day | ORAL | Status: DC
  Administered 2013-12-29 – 2014-01-02 (×5): 10 mg via ORAL
  Filled 2013-12-28 (×5): qty 1

## 2013-12-28 MED ORDER — METHOCARBAMOL 100 MG/ML IJ SOLN: 500.0000 mg | Freq: Four times a day (QID) | INTRAMUSCULAR | Status: DC | PRN

## 2013-12-28 MED ORDER — PANTOPRAZOLE SODIUM 40 MG IV SOLR
40.0000 mg | Freq: Every day | INTRAVENOUS | Status: DC
  Administered 2013-12-28: 40 mg via INTRAVENOUS
  Filled 2013-12-28 (×2): qty 40

## 2013-12-28 MED ORDER — MIDAZOLAM HCL 5 MG/5ML IJ SOLN
INTRAMUSCULAR | Status: DC | PRN
  Administered 2013-12-28: 2 mg via INTRAVENOUS

## 2013-12-28 MED ORDER — METHOCARBAMOL 500 MG PO TABS
500.0000 mg | ORAL_TABLET | Freq: Four times a day (QID) | ORAL | Status: DC | PRN
  Administered 2013-12-28 – 2014-01-02 (×5): 500 mg via ORAL
  Filled 2013-12-28 (×4): qty 1

## 2013-12-28 MED ORDER — ALBUTEROL SULFATE (2.5 MG/3ML) 0.083% IN NEBU: 2.5000 mg | INHALATION_SOLUTION | Freq: Four times a day (QID) | RESPIRATORY_TRACT | Status: DC | PRN

## 2013-12-28 MED ORDER — 0.9 % SODIUM CHLORIDE (POUR BTL) OPTIME
TOPICAL | Status: DC | PRN
  Administered 2013-12-28: 1000 mL

## 2013-12-28 MED ORDER — CEFAZOLIN SODIUM-DEXTROSE 2-3 GM-% IV SOLR
2.0000 g | Freq: Three times a day (TID) | INTRAVENOUS | Status: AC
  Administered 2013-12-28 – 2013-12-29 (×2): 2 g via INTRAVENOUS
  Filled 2013-12-28 (×3): qty 50

## 2013-12-28 MED ORDER — HYDROMORPHONE HCL PF 1 MG/ML IJ SOLN
0.2500 mg | INTRAMUSCULAR | Status: DC | PRN
  Administered 2013-12-28 (×3): 0.5 mg via INTRAVENOUS

## 2013-12-28 MED ORDER — ONDANSETRON HCL 4 MG/2ML IJ SOLN
INTRAMUSCULAR | Status: DC | PRN
  Administered 2013-12-28: 4 mg via INTRAVENOUS

## 2013-12-28 MED ORDER — MIDAZOLAM HCL 2 MG/2ML IJ SOLN: 0.5000 mg | Freq: Once | INTRAMUSCULAR | Status: DC

## 2013-12-28 MED ORDER — PHENOL 1.4 % MT LIQD: 1.0000 | OROMUCOSAL | Status: DC | PRN

## 2013-12-28 MED ORDER — PROPOFOL 10 MG/ML IV BOLUS
INTRAVENOUS | Status: AC
  Filled 2013-12-28: qty 20

## 2013-12-28 MED ORDER — LIDOCAINE HCL (CARDIAC) 20 MG/ML IV SOLN
INTRAVENOUS | Status: AC
  Filled 2013-12-28: qty 5

## 2013-12-28 MED ORDER — ACETAMINOPHEN 650 MG RE SUPP: 650.0000 mg | RECTAL | Status: DC | PRN

## 2013-12-28 MED ORDER — MIDAZOLAM HCL 2 MG/2ML IJ SOLN
INTRAMUSCULAR | Status: AC
  Filled 2013-12-28: qty 2

## 2013-12-28 MED ORDER — MEPERIDINE HCL 25 MG/ML IJ SOLN: 6.2500 mg | INTRAMUSCULAR | Status: DC | PRN

## 2013-12-28 MED ORDER — OXYCODONE HCL 5 MG/5ML PO SOLN: 5.0000 mg | Freq: Once | ORAL | Status: DC | PRN

## 2013-12-28 MED ORDER — DARIFENACIN HYDROBROMIDE ER 7.5 MG PO TB24
7.5000 mg | ORAL_TABLET | Freq: Every day | ORAL | Status: DC
Start: 2013-12-28 — End: 2014-01-02
  Administered 2013-12-28 – 2014-01-02 (×6): 7.5 mg via ORAL
  Filled 2013-12-28 (×6): qty 1

## 2013-12-28 MED ORDER — MENTHOL 3 MG MT LOZG
1.0000 | LOZENGE | OROMUCOSAL | Status: DC | PRN
  Administered 2013-12-31: 3 mg via ORAL
  Filled 2013-12-28 (×2): qty 9

## 2013-12-28 MED ORDER — FENTANYL CITRATE 0.05 MG/ML IJ SOLN
INTRAMUSCULAR | Status: DC | PRN
  Administered 2013-12-28: 50 ug via INTRAVENOUS
  Administered 2013-12-28: 100 ug via INTRAVENOUS
  Administered 2013-12-28: 250 ug via INTRAVENOUS

## 2013-12-28 MED ORDER — OXYCODONE HCL 5 MG PO TABS: 5.0000 mg | ORAL_TABLET | Freq: Once | ORAL | Status: DC | PRN

## 2013-12-28 MED ORDER — HYDROMORPHONE HCL PF 1 MG/ML IJ SOLN
INTRAMUSCULAR | Status: AC
  Filled 2013-12-28: qty 1

## 2013-12-28 MED ORDER — SODIUM CHLORIDE 0.9 % IR SOLN
Status: DC | PRN
  Administered 2013-12-28 (×2)

## 2013-12-28 MED ORDER — PHENYLEPHRINE HCL 10 MG/ML IJ SOLN
10.0000 mg | INTRAVENOUS | Status: DC | PRN
  Administered 2013-12-28: 15 ug/min via INTRAVENOUS

## 2013-12-28 MED ORDER — ROCURONIUM BROMIDE 100 MG/10ML IV SOLN
INTRAVENOUS | Status: DC | PRN
  Administered 2013-12-28: 10 mg via INTRAVENOUS
  Administered 2013-12-28 (×2): 20 mg via INTRAVENOUS
  Administered 2013-12-28: 10 mg via INTRAVENOUS

## 2013-12-28 MED ORDER — NEOSTIGMINE METHYLSULFATE 1 MG/ML IJ SOLN
INTRAMUSCULAR | Status: AC
  Filled 2013-12-28: qty 10

## 2013-12-28 MED ORDER — HYDROMORPHONE HCL PF 1 MG/ML IJ SOLN
1.0000 mg | INTRAMUSCULAR | Status: DC | PRN
  Administered 2013-12-28 – 2013-12-31 (×8): 1 mg via INTRAMUSCULAR
  Filled 2013-12-28 (×6): qty 1
  Filled 2013-12-28: qty 2
  Filled 2013-12-28: qty 1

## 2013-12-28 MED ORDER — THROMBIN 20000 UNITS EX SOLR
CUTANEOUS | Status: DC | PRN
  Administered 2013-12-28: 08:00:00 via TOPICAL

## 2013-12-28 MED ORDER — ROCURONIUM BROMIDE 50 MG/5ML IV SOLN
INTRAVENOUS | Status: AC
  Filled 2013-12-28: qty 2

## 2013-12-28 MED ORDER — ARIPIPRAZOLE 10 MG PO TABS
20.0000 mg | ORAL_TABLET | Freq: Every day | ORAL | Status: DC
  Administered 2013-12-28 – 2014-01-01 (×5): 20 mg via ORAL
  Filled 2013-12-28 (×6): qty 2

## 2013-12-28 MED ORDER — NEOSTIGMINE METHYLSULFATE 1 MG/ML IJ SOLN
INTRAMUSCULAR | Status: DC | PRN
  Administered 2013-12-28: 4 mg via INTRAVENOUS

## 2013-12-28 MED ORDER — ACETAMINOPHEN 325 MG PO TABS
650.0000 mg | ORAL_TABLET | ORAL | Status: DC | PRN
  Administered 2013-12-30: 650 mg via ORAL
  Filled 2013-12-28: qty 2

## 2013-12-28 MED ORDER — SODIUM CHLORIDE 0.9 % IJ SOLN: 3.0000 mL | INTRAMUSCULAR | Status: DC | PRN

## 2013-12-28 MED ORDER — GLYCOPYRROLATE 0.2 MG/ML IJ SOLN
INTRAMUSCULAR | Status: DC | PRN
  Administered 2013-12-28: .7 mg via INTRAVENOUS

## 2013-12-28 MED ORDER — POTASSIUM CHLORIDE IN NACL 20-0.45 MEQ/L-% IV SOLN
INTRAVENOUS | Status: DC
  Administered 2013-12-28 – 2013-12-29 (×2): via INTRAVENOUS
  Filled 2013-12-28 (×11): qty 1000

## 2013-12-28 MED ORDER — LIDOCAINE HCL (CARDIAC) 20 MG/ML IV SOLN
INTRAVENOUS | Status: DC | PRN
  Administered 2013-12-28: 75 mg via INTRAVENOUS

## 2013-12-28 MED ORDER — ONDANSETRON HCL 4 MG/2ML IJ SOLN
INTRAMUSCULAR | Status: AC
  Filled 2013-12-28: qty 2

## 2013-12-28 MED ORDER — GABAPENTIN 300 MG PO CAPS
300.0000 mg | ORAL_CAPSULE | Freq: Two times a day (BID) | ORAL | Status: DC
  Administered 2013-12-28 – 2014-01-02 (×10): 300 mg via ORAL
  Filled 2013-12-28 (×11): qty 1

## 2013-12-28 MED ORDER — FENTANYL CITRATE 0.05 MG/ML IJ SOLN
INTRAMUSCULAR | Status: AC
  Filled 2013-12-28: qty 5

## 2013-12-28 MED ORDER — OXYCODONE-ACETAMINOPHEN 5-325 MG PO TABS
1.0000 | ORAL_TABLET | ORAL | Status: DC | PRN
  Administered 2013-12-29 – 2014-01-02 (×15): 2 via ORAL
  Filled 2013-12-28 (×16): qty 2

## 2013-12-28 MED ORDER — PHENYLEPHRINE HCL 10 MG/ML IJ SOLN
INTRAMUSCULAR | Status: DC | PRN
  Administered 2013-12-28 (×2): 80 ug via INTRAVENOUS

## 2013-12-28 MED ORDER — DOCUSATE SODIUM 100 MG PO CAPS
100.0000 mg | ORAL_CAPSULE | Freq: Two times a day (BID) | ORAL | Status: DC
  Administered 2013-12-28 – 2014-01-02 (×10): 100 mg via ORAL
  Filled 2013-12-28 (×9): qty 1

## 2013-12-28 MED ORDER — METHOCARBAMOL 500 MG PO TABS
ORAL_TABLET | ORAL | Status: AC
  Filled 2013-12-28: qty 1

## 2013-12-28 MED ORDER — PROPOFOL 10 MG/ML IV BOLUS
INTRAVENOUS | Status: DC | PRN
  Administered 2013-12-28: 200 mg via INTRAVENOUS

## 2013-12-28 MED ORDER — PHENYLEPHRINE 40 MCG/ML (10ML) SYRINGE FOR IV PUSH (FOR BLOOD PRESSURE SUPPORT)
PREFILLED_SYRINGE | INTRAVENOUS | Status: AC
  Filled 2013-12-28: qty 10

## 2013-12-28 MED ORDER — PREDNISONE 20 MG PO TABS
20.0000 mg | ORAL_TABLET | Freq: Every day | ORAL | Status: DC
  Administered 2013-12-28 – 2014-01-02 (×6): 20 mg via ORAL
  Filled 2013-12-28 (×6): qty 1

## 2013-12-28 MED ORDER — SODIUM CHLORIDE 0.9 % IV SOLN: 250.0000 mL | INTRAVENOUS | Status: DC

## 2013-12-28 MED ORDER — PROMETHAZINE HCL 25 MG/ML IJ SOLN: 6.2500 mg | INTRAMUSCULAR | Status: DC | PRN

## 2013-12-28 MED ORDER — LACTATED RINGERS IV SOLN
INTRAVENOUS | Status: DC | PRN
  Administered 2013-12-28 (×2): via INTRAVENOUS

## 2013-12-28 MED ORDER — ONDANSETRON HCL 4 MG/2ML IJ SOLN: 4.0000 mg | INTRAMUSCULAR | Status: DC | PRN

## 2013-12-28 SURGICAL SUPPLY — 71 items
BAG DECANTER FOR FLEXI CONT (MISCELLANEOUS) ×2 IMPLANT
BENZOIN TINCTURE PRP APPL 2/3 (GAUZE/BANDAGES/DRESSINGS) ×4 IMPLANT
BLADE SURG ROTATE 9660 (MISCELLANEOUS) IMPLANT
BONE EQUIVA 10CC (Bone Implant) ×2 IMPLANT
BRUSH SCRUB EZ PLAIN DRY (MISCELLANEOUS) ×2 IMPLANT
BUR CUTTER 7.0 ROUND (BURR) ×2 IMPLANT
BUR MATCHSTICK NEURO 3.0 LAGG (BURR) ×2 IMPLANT
CANISTER SUCT 3000ML (MISCELLANEOUS) ×2 IMPLANT
CONT SPEC 4OZ CLIKSEAL STRL BL (MISCELLANEOUS) ×4 IMPLANT
COVER BACK TABLE 24X17X13 BIG (DRAPES) IMPLANT
COVER TABLE BACK 60X90 (DRAPES) ×2 IMPLANT
DERMABOND ADVANCED (GAUZE/BANDAGES/DRESSINGS)
DERMABOND ADVANCED .7 DNX12 (GAUZE/BANDAGES/DRESSINGS) IMPLANT
DILATOR NON-RADIOLUCENT CANN (MISCELLANEOUS) ×4 IMPLANT
DRAPE C-ARM 42X72 X-RAY (DRAPES) ×4 IMPLANT
DRAPE C-ARMOR (DRAPES) ×2 IMPLANT
DRAPE LAPAROTOMY 100X72X124 (DRAPES) ×2 IMPLANT
DRAPE SURG 17X23 STRL (DRAPES) ×4 IMPLANT
DRESSING TELFA 8X3 (GAUZE/BANDAGES/DRESSINGS) ×2 IMPLANT
DRSG OPSITE POSTOP 4X6 (GAUZE/BANDAGES/DRESSINGS) ×2 IMPLANT
DURAPREP 26ML APPLICATOR (WOUND CARE) ×2 IMPLANT
DURASEAL APPLICATOR TIP (TIP) ×2 IMPLANT
DURASEAL SPINE SEALANT 3ML (MISCELLANEOUS) ×2 IMPLANT
ELECT REM PT RETURN 9FT ADLT (ELECTROSURGICAL) ×2
ELECTRODE REM PT RTRN 9FT ADLT (ELECTROSURGICAL) ×1 IMPLANT
EVACUATOR 1/8 PVC DRAIN (DRAIN) ×2 IMPLANT
GAUZE SPONGE 4X4 16PLY XRAY LF (GAUZE/BANDAGES/DRESSINGS) ×2 IMPLANT
GLOVE BIOGEL PI IND STRL 7.5 (GLOVE) ×2 IMPLANT
GLOVE BIOGEL PI IND STRL 8 (GLOVE) ×1 IMPLANT
GLOVE BIOGEL PI INDICATOR 7.5 (GLOVE) ×2
GLOVE BIOGEL PI INDICATOR 8 (GLOVE) ×1
GLOVE ECLIPSE 7.0 STRL STRAW (GLOVE) ×6 IMPLANT
GLOVE ECLIPSE 7.5 STRL STRAW (GLOVE) ×4 IMPLANT
GLOVE ECLIPSE 8.0 STRL XLNG CF (GLOVE) ×4 IMPLANT
GLOVE EXAM NITRILE LRG STRL (GLOVE) IMPLANT
GLOVE EXAM NITRILE MD LF STRL (GLOVE) IMPLANT
GLOVE EXAM NITRILE XS STR PU (GLOVE) IMPLANT
GOWN BRE IMP SLV AUR LG STRL (GOWN DISPOSABLE) IMPLANT
GOWN BRE IMP SLV AUR XL STRL (GOWN DISPOSABLE) IMPLANT
GOWN STRL REIN 2XL LVL4 (GOWN DISPOSABLE) IMPLANT
IMPLANT ARDIS PEEK 8X9X22 (Orthopedic Implant) ×4 IMPLANT
K-WIRE NITHNOL TROCAR TIP (WIRE) ×8 IMPLANT
KIT BASIN OR (CUSTOM PROCEDURE TRAY) ×2 IMPLANT
KIT ROOM TURNOVER OR (KITS) ×2 IMPLANT
NEEDLE HYPO 22GX1.5 SAFETY (NEEDLE) ×2 IMPLANT
NEEDLE TARGETING (NEEDLE) ×8 IMPLANT
NS IRRIG 1000ML POUR BTL (IV SOLUTION) ×2 IMPLANT
PACK LAMINECTOMY NEURO (CUSTOM PROCEDURE TRAY) ×2 IMPLANT
PAD ARMBOARD 7.5X6 YLW CONV (MISCELLANEOUS) ×6 IMPLANT
PATTIES SURGICAL .5 X.5 (GAUZE/BANDAGES/DRESSINGS) ×2 IMPLANT
PATTIES SURGICAL .75X.75 (GAUZE/BANDAGES/DRESSINGS) IMPLANT
ROD BENT PERC 35MM (Rod) ×2 IMPLANT
ROD PATHFINDER 40MM (Rod) ×2 IMPLANT
SCREW MIN INVASIVE 6.5X35 (Screw) ×4 IMPLANT
SCREW POLYAXIA MIS 6.5X40MM (Screw) ×4 IMPLANT
SPONGE GAUZE 4X4 12PLY (GAUZE/BANDAGES/DRESSINGS) ×4 IMPLANT
SPONGE LAP 4X18 X RAY DECT (DISPOSABLE) IMPLANT
SPONGE SURGIFOAM ABS GEL 100 (HEMOSTASIS) ×2 IMPLANT
STRIP CLOSURE SKIN 1/2X4 (GAUZE/BANDAGES/DRESSINGS) ×4 IMPLANT
SUT PROLENE 0 CT 1 30 (SUTURE) ×2 IMPLANT
SUT VIC AB 0 CT1 18XCR BRD8 (SUTURE) ×2 IMPLANT
SUT VIC AB 0 CT1 8-18 (SUTURE) ×2
SUT VIC AB 2-0 OS6 18 (SUTURE) ×6 IMPLANT
SUT VIC AB 3-0 CP2 18 (SUTURE) ×2 IMPLANT
SYR 20ML ECCENTRIC (SYRINGE) ×2 IMPLANT
TOP CLSR SEQUOIA (Orthopedic Implant) ×8 IMPLANT
TOWEL OR 17X24 6PK STRL BLUE (TOWEL DISPOSABLE) ×2 IMPLANT
TOWEL OR 17X26 10 PK STRL BLUE (TOWEL DISPOSABLE) ×2 IMPLANT
TRAP SPECIMEN MUCOUS 40CC (MISCELLANEOUS) ×2 IMPLANT
TRAY FOLEY CATH 14FRSI W/METER (CATHETERS) ×2 IMPLANT
WATER STERILE IRR 1000ML POUR (IV SOLUTION) ×2 IMPLANT

## 2013-12-28 NOTE — Anesthesia Procedure Notes (Signed)
Procedure Name: Intubation Date/Time: 12/28/2013 7:43 AM Performed by: Eligha Bridegroom Pre-anesthesia Checklist: Patient being monitored, Patient identified, Emergency Drugs available, Timeout performed and Suction available Patient Re-evaluated:Patient Re-evaluated prior to inductionOxygen Delivery Method: Circle system utilized Preoxygenation: Pre-oxygenation with 100% oxygen Intubation Type: IV induction Ventilation: Mask ventilation without difficulty Laryngoscope Size: Mac Grade View: Grade I Tube type: Oral Tube size: 7.0 mm Number of attempts: 1 Airway Equipment and Method: Stylet Placement Confirmation: ETT inserted through vocal cords under direct vision,  breath sounds checked- equal and bilateral and positive ETCO2 Secured at: 21 cm Tube secured with: Tape Dental Injury: Teeth and Oropharynx as per pre-operative assessment

## 2013-12-28 NOTE — Progress Notes (Signed)
Patient admitted through PACU, alert and oriented. Dressing assessed was CDI , foley in situ. On bed rest for 24 hrs. Call bell placed within reach and patient oriented to the room.

## 2013-12-28 NOTE — Op Note (Signed)
Preop diagnosis: Spondylosis L5-S1 with severe foraminal encroachment with both L5 and S1 nerve root compression Postop diagnosis: Same Procedure: Bilateral L5-S1 decompressive laminectomy with decompression of both L5 and S1 nerve roots more so than needed for interbody fusion Bilateral L5-S1 microdiscectomy L5-S1 posterior lumbar interbody fusion with 8 mm peek interbody spacers L5-S1 nonsegmental instrumentation with Pathfinder percutaneous pedicle screw instrumentation Surgeon: Koren Plyler Assistant: Nundkumar  After being placed the prone position the patient's back was prepped and draped in the usual sterile fashion. Localizing fluoroscopy was used prior to incision to identify the appropriate level. Midline incision was made above the spinous processes of L5 and S1. Using Bovie cutting current the incision was carried down to the fascia. The subcutaneous tissue was dissected free from the fascia and then the fascia was opened and the subperiosteal dissection on the spinous processes and lamina and facet joint of L5-S1 bilaterally. Self-retaining tract was placed for exposure and x-ray showed approach the appropriate level. Spinous processes of L5 and S1 removed. On the patient's left side edges of the previous laminotomy were identified. We generously enlarged this to the point we removed the entire L5 lamina the medial 80% of the facet joint and the superior one third of the S1 segment. Residual bone and scar and hypertrophic ligamentum flavum removed in piecemeal fashion. We're able to decompress and visualize both the L5 and S1 nerve roots with great care. We then December decompression the patient's right side and then removed the residual midline structures to complete the bilateral decompressive laminectomy. As previously stated both L5 and S1 nerve roots were well visualized well decompressed more so than needed for interbody fusion. The disc space was then entered and cleaned out with pituitary  rongeurs and curettes. It was then dissected up to an 8 mm size was felt to be a good fit for interbody fusion. We prepared the endplates interbody fusion then impacted and 8 x 9 x 22 mm cage on one side. We then placed a cage on the second side. Prior to placing the cage which was filled with a mixture of autologous bone morselized allograft, we placed the same mixture deep within the interspace to help with interbody fusion. Spaces were followed in excellent position by fluoroscopy. We irrigated copiously. The small area where arachnoid was as well but no spinal fluid leakage. We placed him DuraSeal over that and then placed Gelfoam over that and then closed the fascial opening with interrupted 0 Vicryl. We irrigated once more then placed percutaneous pedicle screws in standard fashion. We passed the Jamshidi needle from lateral to medial direction as best possible. Bone was extremely sclerotic very difficult to pass the Jamshidi needle portable to pass into good position. We placed guidewires and then tapped with the 6.0 mm tap and then placed 6.5 x 40 mm screws at L5 and 6.5 x 45 Miller screws at S1. We passed the rod down the Towers curettes the top of the screws in standard fashion. We did final tightening with torque and counter torque. We then removed the [facility] and the screws and rods interbody spacers looked good in AP lateral fluoroscopy. We irrigated the wounds were closed the fascial opening above the screws with interrupted 0 Vicryl. We then closed the subcutaneous and subcuticular tissues with interrupted Vicryl a. We did a running locking Prolene on the skin. Shortness was then applied the patient was extubated and taken to recovery in stable condition.

## 2013-12-28 NOTE — Progress Notes (Signed)
Called for a sign out

## 2013-12-28 NOTE — Anesthesia Postprocedure Evaluation (Signed)
  Anesthesia Post-op Note  Patient: Lori Bean  Procedure(s) Performed: Procedure(s) with comments: POSTERIOR LUMBAR INTERBODY FUSION LUMBAR FIVE-SACRAL ONE, PERCUTANEOUS SCREWS (N/A) - POSTERIOR LUMBAR INTERBODY FUSION LUMBAR FIVE-SACRAL ONE, PERCUTANEOUS SCREWS  Patient Location: PACU  Anesthesia Type:General  Level of Consciousness: awake and alert   Airway and Oxygen Therapy: Patient Spontanous Breathing  Post-op Pain: mild  Post-op Assessment: Post-op Vital signs reviewed, Patient's Cardiovascular Status Stable, Respiratory Function Stable, Patent Airway, No signs of Nausea or vomiting and Pain level controlled  Post-op Vital Signs: Reviewed and stable  Last Vitals:  Filed Vitals:   12/28/13 1556  BP:   Pulse:   Temp: 36.7 C  Resp:     Complications: No apparent anesthesia complications

## 2013-12-28 NOTE — H&P (Signed)
Lori Bean is an 43 y.o. female.   Chief Complaint: Back and left greater than right leg pain HPI: The patient is a 43 year old female who had a lumbar laminotomy and L5-S1 on the left many years ago. She did well but recently developed persistent back pain going down the left leg with much milder pain on the right. She tried a great deal of conservative therapy including medications and epidural shots all without relief. General MRI scan and subsequent myelography which showed a large disc osteophyte complex completely filling out the foramen on the left side at L5-S1. After discussing the options it was elected to proceed with surgery and she now comes for a posterior lumbar interbody fusion with pedicle screw fixation. I had a long discussion with her regarding the risks and benefits of surgical intervention. The risks discussed include but are not limited to bleeding infection weakness numbness paralysis trouble with instrumentation nonunion coma and death. We have discussed alternative methods of therapy offered risks and benefits of nonintervention. She's had the opportunity to ask numerous questions and appears to understand. This information in hand she has requested to proceed with surgery.  Past Medical History  Diagnosis Date  . Asthma   . Hypertension   . Anxiety   . Snoring disorder     Pt stated my boyfriend always wakes me up and tells me to breathe  . Heart murmur   . CHF (congestive heart failure)   . Shortness of breath   . Bronchitis     hx of  . Bipolar affective disorder     Schizophrenia  . Depression   . GERD (gastroesophageal reflux disease)   . Arthritis   . Overactive bladder   . Schizophrenia   . Sciatica   . Anginal pain     occ from asthma    Past Surgical History  Procedure Laterality Date  . Rotator cuff repair      Right shoulder  . Tubal ligation    . Gallstones reomved    . Lumbar laminectomy/decompression microdiscectomy  04/01/2012   Procedure: LUMBAR LAMINECTOMY/DECOMPRESSION MICRODISCECTOMY 2 LEVELS;  Surgeon: Faythe Ghee, MD;  Location: [facility] NEUR[facility] OR[facility];  Service: Neurosurgery;  Laterality: Left;  Lumbar four-five, lumbar five sacral one microdiscectomy   . Back surgery    . Lumbar wound debridement  04/29/2012    Procedure: LUMBAR WOUND DEBRIDEMENT;  Surgeon: Faythe Ghee, MD;  Location: [place] [facility]UR[facility] OR[facility];  Service: Neurosurgery;  Laterality: N/A;  lumbar wound debridement  . Cholecystectomy    . Eye surgery      Metal plate in right eye    History reviewed. No pertinent family history. Social History:  reports that she has been smoking Cigarettes.  She has a 2.25 pack-year smoking history. She has never used smokeless tobacco. She reports that she drinks alcohol. She reports that she does not use illicit drugs.  Allergies:  Allergies  Allergen Reactions  . Aspirin Nausea And Vomiting    'It makes me throw up."    Medications Prior to Admission  Medication Sig Dispense Refill  . albuterol (PROVENTIL HFA;VENTOLIN HFA) 108 (90 BASE) MCG/ACT inhaler Inhale 2 puffs into the lungs every 6 (six) hours as needed for wheezing or shortness of breath.      [name] albuterol (PROVENTIL) (5 MG/ML) 0.5% nebulizer solution Take 0.5 mLs (2.5 mg total) by nebulization every 4 (four) hours as needed for wheezing or shortness of breath.  20 mL  12  . amLODipine (  NORVASC) 10 MG tablet Take 1 tablet (10 mg total) by mouth daily.  30 tablet  3  . ARIPiprazole (ABILIFY) 20 MG tablet Take 20 mg by mouth at bedtime.      [name] esomeprazole (NEXIUM) 40 MG capsule Take 40 mg by mouth daily at 12 noon.      . furosemide (LASIX) 40 MG tablet Take 1 tablet (40 mg total) by mouth daily.  30 tablet  3  . gabapentin (NEURONTIN) 300 MG capsule Take 1 capsule (300 mg total) by mouth 2 (two) times daily.  60 capsule  3  . methocarbamol (ROBAXIN-750) 750 MG tablet Take 1 tablet (750 mg total) by mouth 4 (four) times daily.  40 tablet  0  .  oxyCODONE-acetaminophen (PERCOCET) 10-325 MG per tablet Take 1 tablet by mouth every 4 (four) hours as needed for pain.      . predniSONE (DELTASONE) 10 MG tablet Take 2 tablets (20 mg total) by mouth daily.  21 tablet  0  . solifenacin (VESICARE) 5 MG tablet Take 5 mg by mouth daily.      [name] triamcinolone cream (KENALOG) 0.1 % Apply 1 application topically 2 (two) times daily as needed (eczema).        No results found for this or any previous visit (from the past 48 hour(s)). No results found.  Review of systems not obtained due to patient factors.  Blood pressure 174/89, pulse 80, temperature 98.1 F (36.7 C), temperature source Oral, resp. rate 20, weight 101.606 kg (224 lb), SpO2 100.00%.  The patient is awake or and oriented. She is no facial asymmetry. Her gait is mildly antalgic. Reflexes are decreased but equal and she is normal strength and sensation. 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 The plan is for an L5-S1 posterior lumbar interbody fusion with pedicle screw fixation.  Faythe Ghee, MD 12/28/2013, 7:24 AM

## 2013-12-28 NOTE — Transfer of Care (Signed)
Immediate Anesthesia Transfer of Care Note  Patient: Lori Bean  Procedure(s) Performed: Procedure(s) with comments: POSTERIOR LUMBAR INTERBODY FUSION LUMBAR FIVE-SACRAL ONE, PERCUTANEOUS SCREWS (N/A) - POSTERIOR LUMBAR INTERBODY FUSION LUMBAR FIVE-SACRAL ONE, PERCUTANEOUS SCREWS  Patient Location: PACU  Anesthesia Type:General  Level of Consciousness: awake and alert   Airway & Oxygen Therapy: Patient Spontanous Breathing and Patient connected to nasal cannula oxygen  Post-op Assessment: Report given to PACU RN and Post -op Vital signs reviewed and stable  Post vital signs: Reviewed and stable  Complications: No apparent anesthesia complications

## 2013-12-28 NOTE — Progress Notes (Signed)
Per MD (Dr Hal Neer), bedrest for patient can be discontinued in the morning instead for 24 hrs.

## 2013-12-28 NOTE — Anesthesia Preprocedure Evaluation (Addendum)
Anesthesia Evaluation  Patient identified by MRN, date of birth, ID band Patient awake, Patient confused and Patient unresponsive    Reviewed: Allergy & Precautions, H&P , NPO status , Patient's Chart, lab work & pertinent test results, reviewed documented beta blocker date and time   History of Anesthesia Complications Negative for: history of anesthetic complications  Airway Mallampati: II TM Distance: >3 FB Neck ROM: Full    Dental  (+) Dental Advisory Given   Pulmonary asthma , sleep apnea (no CPAP yet) , COPD COPD inhaler, Current Smoker,  breath sounds clear to auscultation        Cardiovascular hypertension, Pt. on medications - anginaRhythm:Regular Rate:Normal     Neuro/Psych Schizophrenia Chronic back pain: narcotics    GI/Hepatic Neg liver ROS, GERD-  Medicated and Poorly Controlled,  Endo/Other  Morbid obesity  Renal/GU negative Renal ROS     Musculoskeletal   Abdominal (+) + obese,   Peds  Hematology   Anesthesia Other Findings   Reproductive/Obstetrics April 2015 preg test NEG                      Anesthesia Physical Anesthesia Plan  ASA: III  Anesthesia Plan: General   Post-op Pain Management:    Induction: Intravenous and Rapid sequence  Airway Management Planned: Oral ETT  Additional Equipment:   Intra-op Plan:   Post-operative Plan: Extubation in OR  Informed Consent:   Dental advisory given  Plan Discussed with: Surgeon and CRNA  Anesthesia Plan Comments: (Plan routine monitors, GETA)       Anesthesia Quick Evaluation

## 2013-12-29 MED ORDER — PANTOPRAZOLE SODIUM 40 MG PO TBEC
40.0000 mg | DELAYED_RELEASE_TABLET | Freq: Every day | ORAL | Status: DC
  Administered 2013-12-29 – 2014-01-02 (×5): 40 mg via ORAL
  Filled 2013-12-29 (×5): qty 1

## 2013-12-29 NOTE — Progress Notes (Signed)
CARE MANAGEMENT NOTE 12/29/2013  Patient:  Lori Bean, Lori Bean   Account Number:  [id number]  Date Initiated:  12/29/2013  Documentation initiated by:  Olga Coaster  Subjective/Objective Assessment:   ADMITTED FOR BACK SURGERY     Action/Plan:   CM FOLLOWING FOR DCP   Anticipated DC Date:  12/30/2013   Anticipated DC Plan:  POSSIBLY HOME/SELF CARE     DC Planning Services  CM consult         Status of service:  In process, will continue to follow Medicare Important Message given?  NA - LOS <3 / Initial given by admissions (If response is "NO", the following Medicare IM given date fields will be blank)  Per UR Regulation:  Reviewed for med. necessity/level of care/duration of stay  Comments:  4/17/2015Mindi Slicker RN,BSN,MHA [phone]

## 2013-12-29 NOTE — Progress Notes (Signed)
No issues overnight. Pt reports resolution of her preoperative leg pain. She has appropriate back pain. No other c/o.  EXAM:  BP 128/71  Pulse 96  Temp(Src) 99.2 F (37.3 C) (Axillary)  Resp 18  Wt 101.606 kg (224 lb)  SpO2 99%  Awake, alert, oriented  Speech fluent, appropriate  CN grossly intact  5/5 BUE/BLE x weak left EHL Wound c/d/i  IMPRESSION:  43 y.o. female POD# 1 s/p L5-S1 PLIF - Doing well postop, has been flat for arachnoid herniation without CSF leak intraoperatively.  PLAN: - OOB today with PT/OT - D/C Foley - If ambulating / voiding, can likely d/c tomorrow

## 2013-12-29 NOTE — Progress Notes (Signed)
Nutrition Brief Note  Patient identified on the Malnutrition Screening Tool (MST) Report  Wt Readings from Last 15 Encounters:  12/28/13 224 lb (101.606 kg)  12/28/13 224 lb (101.606 kg)  12/19/13 224 lb 6.4 oz (101.787 kg)  06/07/13 232 lb 6.4 oz (105.416 kg)  07/12/12 226 lb (102.513 kg)  05/09/12 220 lb 14.4 oz (100.2 kg)  04/29/12 220 lb 14.4 oz (100.2 kg)  04/29/12 220 lb 14.4 oz (100.2 kg)  03/29/12 236 lb 6.4 oz (107.23 kg)  03/23/12 231 lb 12.8 oz (105.144 kg)    Body mass index is 40.96 kg/(m^2). Patient meets criteria for Morbid Obesity based on current BMI.   Current diet order is Regular, patient is consuming approximately 75% of meals at this time. Pt reports having a good appetite and denies any unintentional weight loss. Pt expresses desire to lose weight and requests weight loss tips.  RD provided "Weight Loss Tips" and "1800 Calorie 5-Day Sample menus" handouts from the Academy of [employer] and Dietetics. Discussed importance of controlled and consistent intake throughout the day. Provided examples of ways to balance meals/snacks and encouraged intake of high-fiber, whole grain complex carbohydrates. Encouraged intake of fruits and vegetables with each meal. Encouraged pt to limit/avoid intake of high fat/fried foods. Emphasized the importance of hydration with calorie-free beverages and limiting sugar-sweetened beverages. Encouraged pt to discuss physical activity options with physician. Teach back method used.  Expect good compliance.  Labs and medications reviewed. No further nutrition interventions warranted at this time. If nutrition issues arise, please consult RD.   Pryor Ochoa RD, LDN Inpatient Clinical Dietitian Pager: [phone] After Hours Pager: [phone]

## 2013-12-29 NOTE — Progress Notes (Signed)
Patient ambulated about 86ft in the hallway, tolerated it well.

## 2013-12-29 NOTE — Progress Notes (Signed)
Patient,s foley can be d/c since she can come off bedrest this morning but awaiting on MD rounds for him to decide.

## 2013-12-30 MED ORDER — POLYETHYLENE GLYCOL 3350 17 G PO PACK
17.0000 g | PACK | Freq: Every day | ORAL | Status: DC | PRN
  Filled 2013-12-30: qty 1

## 2013-12-30 MED ORDER — BISACODYL 10 MG RE SUPP
10.0000 mg | Freq: Two times a day (BID) | RECTAL | Status: DC | PRN
  Administered 2013-12-30: 10 mg via RECTAL
  Filled 2013-12-30 (×2): qty 1

## 2013-12-30 NOTE — Progress Notes (Signed)
Subjective: Patient reports Her legs are doing well improved numbness and tingling her biggest problem right now is constipation. She denies any headache  Objective: Vital signs in last 24 hours: Temp:  [98 F (36.7 C)-99.2 F (37.3 C)] 98 F (36.7 C) (04/18 0549) Pulse Rate:  [80-96] 84 (04/18 0549) Resp:  [18-20] 20 (04/18 0549) BP: (114-156)/(60-81) 156/81 mmHg (04/18 0549) SpO2:  [92 %-100 %] 93 % (04/18 0549)  Intake/Output from previous day: 04/17 0701 - 04/18 0700 In: 240 [P.O.:240] Out: 3250 [Urine:3250] Intake/Output this shift:    Abdomen slightly distended she is passing gas strength is 5 out of 5 wound is clean and dry  Lab Results: No results found for this basename: WBC, HGB, HCT, PLT,  in the last 72 hours BMET No results found for this basename: NA, K, CL, CO2, GLUCOSE, BUN, CREATININE, CALCIUM,  in the last 72 hours  Studies/Results: Dg Lumbar Spine 2-3 Views  12/28/2013   CLINICAL DATA:  L5-S1 PLIF with percutaneously placed screws  EXAM: LUMBAR SPINE - 2-3 VIEW  COMPARISON:  CT L SPINE W/CM dated 11/29/2013  FINDINGS: A single fluoro spot film reveals pedicle screws at L5 and at S1 with connecting rods bilaterally. An intradiscal device has been placed.  IMPRESSION: The patient has undergone L5-S1 PLIF without immediate postprocedure complication.   Electronically Signed   By: David  Martinique   On: 12/28/2013 15:10    Assessment/Plan: Work on mobilization of her bowels we'll start with MiraLAX and moved to suppositories and enemas as needed.  LOS: 2 days     Elaina Hoops 12/30/2013, 8:09 AM

## 2013-12-30 NOTE — Progress Notes (Signed)
Pt ambulate 20 feet in hall way encouraged to use insentive spirometry Doculax suppository administered thereafter for constipation. Temp 99.1 will continue  To monitor.

## 2013-12-30 NOTE — Progress Notes (Signed)
Pt had a fever  I/S encouraged pt demonstrated use. Pt encourage to mobilized but c/o pain.  Refused percocet and tylenol c/o constipation mira lax given dulcolax but pt refused dulcolax no bowel movent however pt passing lots of gas. Pt needs PT/ OT evaluation .  OOB to chair and bedside commode x 3   Deferred ambulation in hallway  To next shift. Will continue to monitor pt fever closely and report.

## 2013-12-31 NOTE — Progress Notes (Signed)
Patient ID: Lori Bean, female   DOB: 14-Apr-1971, 43 y.o.   MRN: [id number] Patient is doing well no headache no nausea still with abdominal distention  Strength out of 5 wound clean and dry without sign drainage  Continue to work on mobilizing her bowels in physical therapy.

## 2014-01-01 MED ORDER — FLEET ENEMA 7-19 GM/118ML RE ENEM
1.0000 | ENEMA | Freq: Every day | RECTAL | Status: DC | PRN
  Filled 2014-01-01: qty 1

## 2014-01-01 NOTE — Evaluation (Signed)
Occupational Therapy Evaluation Patient Details Name: Lori Bean MRN: [id number] DOB: 09-13-1971 Today's Date: 01/01/2014    History of Present Illness 43 y.o. s/p POSTERIOR LUMBAR INTERBODY FUSION LUMBAR FIVE-SACRAL ONE, PERCUTANEOUS SCREWS (N/A) - POSTERIOR LUMBAR INTERBODY FUSION LUMBAR FIVE-SACRAL ONE, PERCUTANEOUS SCREWS   Clinical Impression   Pt presents with below problem list. Pt independent with ADLs, PTA. Feel pt will benefit from acute OT to increase independence prior to d/c.    Follow Up Recommendations  SNF;Supervision/Assistance - 24 hour    Equipment Recommendations  3 in 1 bedside comode;Other (comment) (AE)    Recommendations for Other Services  social work consult     Precautions / Restrictions Precautions Precautions: Back;Fall Precaution Booklet Issued: No Precaution Comments: Reviewed precautions with pt Required Braces or Orthoses: Spinal Brace Spinal Brace: Lumbar corset;Applied in sitting position Restrictions Weight Bearing Restrictions: No      Mobility Bed Mobility Overal bed mobility: Needs Assistance Bed Mobility: Sit to Sidelying;Rolling Rolling: Mod assist       Sit to sidelying: Mod assist General bed mobility comments: Educated on log rolling technique. Assisted with LE's to go to sidelying position and assisted with legs in rolling.  Transfers Overall transfer level: Needs assistance Equipment used: Rolling walker (2 wheeled) Transfers: Sit to/from Stand Sit to Stand: Min guard;Min assist         General transfer comment: Cues for hand placement/technique.    Balance                                            ADL Overall ADL's : Needs assistance/impaired     Grooming: Wash/dry hands;Standing;Min guard           Upper Body Dressing : Set up;Supervision/safety;Sitting   Lower Body Dressing: Minimal assistance;With adaptive equipment;Sit to/from stand   Toilet Transfer: Min  guard;Ambulation;RW   Toileting- Clothing Manipulation and Hygiene: Sit to/from stand;Moderate assistance       Functional mobility during ADLs: Min guard;Rolling walker General ADL Comments: Discussed use of cup for teeth care and placement of grooming items to avoid breaking precautions. Pt practiced with AE for LB ADLs-discussed she could also lay flat for LB dressing. Educated on toilet aid for hygiene.  Discussed back brace-wear gown/shirt under.     Vision                     Perception     Praxis      Pertinent Vitals/Pain Pain 10/10. Increased activity during session. Repositioned.      Hand Dominance Right   Extremity/Trunk Assessment Upper Extremity Assessment Upper Extremity Assessment: Overall WFL for tasks assessed   Lower Extremity Assessment Lower Extremity Assessment: Defer to PT evaluation       Communication Communication Communication: No difficulties   Cognition Arousal/Alertness: Awake/alert Behavior During Therapy: WFL for tasks assessed/performed Overall Cognitive Status: Within Functional Limits for tasks assessed                     General Comments       Exercises       Shoulder Instructions      Home Living   Living Arrangements: Other (Comment);Spouse/significant other (boyfriend; homeless- need social worker consult- sleeps on f)  Prior Functioning/Environment Level of Independence: Independent             OT Diagnosis: Acute pain   OT Problem List: Decreased strength;Decreased range of motion;Decreased activity tolerance;Decreased knowledge of use of DME or AE;Decreased knowledge of precautions;Pain   OT Treatment/Interventions: Self-care/ADL training;DME and/or AE instruction;Therapeutic activities;Patient/family education;Balance training    OT Goals(Current goals can be found in the care plan section) Acute Rehab OT Goals Patient Stated Goal: not  stated OT Goal Formulation: With patient Time For Goal Achievement: 01/08/14 Potential to Achieve Goals: Good  OT Frequency: Min 2X/week   Barriers to D/C: Decreased caregiver support          Co-evaluation              End of Session Equipment Utilized During Treatment: Gait belt;Rolling walker;Back brace  Activity Tolerance: Patient tolerated treatment well Patient left: in bed;with call bell/phone within reach;with bed alarm set   Time: 1120-1145 OT Time Calculation (min): 25 min Charges:  OT General Charges $OT Visit: 1 Procedure OT Evaluation $Initial OT Evaluation Tier I: 1 Procedure OT Treatments $Self Care/Home Management : 8-22 mins G-Codes:    Benito Mccreedy OTR/L [phone] 01/01/2014, 12:25 PM

## 2014-01-01 NOTE — Progress Notes (Signed)
No issues overnight. Pt c/o some left foot numbness and has not had a BM yet, otherwise doing well. No HA. Back pain controlled with oral meds.   EXAM:  BP 110/71  Pulse 84  Temp(Src) 98.4 F (36.9 C) (Oral)  Resp 20  Ht 5\' 2"  (1.575 m)  Wt 101.606 kg (224 lb)  SpO2 96%  Awake, alert, oriented  Speech fluent, appropriate  CN grossly intact  5/5 BUE/BLE x decreased L EHL  IMPRESSION:  43 y.o. female POD# 4 s/p L5-S1 PLIF  PLAN: - Add fleets enema PRN - PT/OT eval today - Pt would like to go home tomorrow.

## 2014-01-01 NOTE — Progress Notes (Signed)
Patient refused fleet enema at this time. Will continue to monitor.

## 2014-01-01 NOTE — Progress Notes (Signed)
Patient refused to ambulate at this time

## 2014-01-01 NOTE — Clinical Social Work Note (Signed)
CSW received consult for SNF placement at time of discharge. CSW met with pt at bedside [please see full assessment in separate note]. SNF not appropriate for SNF placement per chart review. CSW has consulted with CSW Director regarding discharge disposition. At this time, CSW recommending pt to discharge to previous living arrangement as pt and CSW discussed living arrangement at length. RNCM updated.  CSW currently assisting with pt's homeless concerns in seeking long-term housing placement through [facility] resources. CSW to continue to follow to assist with resources.  Pati Gallo, [place] Social Worker [phone]

## 2014-01-01 NOTE — Clinical Social Work Psychosocial (Signed)
Clinical Social Work Department BRIEF PSYCHOSOCIAL ASSESSMENT 01/01/2014  Patient:  Lori Bean, Lori Bean     Account Number:  [id number]     Admit date:  12/28/2013  Clinical Social Worker:  Donna Christen  Date/Time:  01/01/2014 03:51 PM  Referred by:  Physician  Date Referred:  01/01/2014 Referred for  SNF Placement   Other Referral:   homeless   Interview type:  Patient Other interview type:   none.    PSYCHOSOCIAL DATA Living Status:  SIGNIFICANT OTHER Admitted from facility:   Level of care:   Primary support name:  none given Primary support relationship to patient:  NONE Degree of support available:   Pt lives with "boyfriend." Strong support system, per pt.    CURRENT CONCERNS Current Concerns  Post-Acute Placement  Other - See comment   Other Concerns:   homeless.    SOCIAL WORK ASSESSMENT / PLAN CSW met with pt at bedside to discuss discharge disposition. Per pt, pt currently living with boyfriend in an "abandon house" and is not paying rent. Pt informed CSW pt and pt's boyfriend currently living [employer] and running water. Pt stated pt and pt's boyfriend have a kerosene heater, but are not always able to fill it due to finances. Pt informed CSW pt's mode of transportation is pt and pt's boyfriend car. CSW and pt discussed nutrition. Pt informed CSW pt and pt's boyfriend often visit "the chicken lady" in downtown [place]. Pt stated this resource provides pt and pt's boyfriend with food [daily], blankets, hygeine products, and other resources. Pt stated "no one in [place] should ever be hungry with all the places to get food." Pt voiced no concerns regarding living arrangment.    CSW discussed possibility of SNF placement at time of discharge. Pt stated she was agreeable, but would like to stay locally due to pt having two children [60 and 86 years-old] living locally. CSW informed pt of LOG due to pt's Medicaid. Pt understood, but still  prefers to stay locally at time of discharge.    CSW consulted with CSW Director regarding placement. CSW and CSW Director agree pt is not eligible to SNF placement due to chart review [pt ambulating 200 feet with supervision]. CSW has consulted with RNCM who stated they are agreeable to discharge disposition.   Assessment/plan status:  Psychosocial Support/Ongoing Assessment of Needs Other assessment/ plan:   none.   Information/referral to community resources:   CSW currently collecting local resources for pt regarding nutrition and housing.    PATIENTS/FAMILYS RESPONSE TO PLAN OF CARE: Pt understanding and agreeable to CSW plan of care. Pt very pleasant to speak with.       Pati Gallo, [place] Social Worker [phone]

## 2014-01-01 NOTE — Evaluation (Signed)
Physical Therapy Evaluation Patient Details Name: Lori Bean MRN: [id number] DOB: [birth year] Today's Date: 01/01/2014   History of Present Illness  43 y.o. s/p POSTERIOR LUMBAR INTERBODY FUSION LUMBAR FIVE-SACRAL ONE, PERCUTANEOUS SCREWS (N/A) - POSTERIOR LUMBAR INTERBODY FUSION LUMBAR FIVE-SACRAL ONE, PERCUTANEOUS SCREWS   Clinical Impression  Pt presents at supervision level for mobility and gait, requiring cuing to maintain back precautions, assist to don brace.  Pt will benefit from skilled PT services to increase independence with functional mobility and improve strength.  Pt comes from beign homeless, will benefit from SNF placement to reduce risk of infection and improve compliance with precautions.    Follow Up Recommendations SNF    Equipment Recommendations  Rolling walker with 5" wheels;3in1 (PT);Hospital bed (pt will need equipment if d/c homeless and not SNF)    Recommendations for Other Services       Precautions / Restrictions Precautions Precautions: Back;Fall Precaution Booklet Issued: No Precaution Comments: Reviewed precautions with pt Required Braces or Orthoses: Spinal Brace Spinal Brace: Lumbar corset;Applied in sitting position Restrictions Weight Bearing Restrictions: No      Mobility  Bed Mobility Overal bed mobility: Needs Assistance Bed Mobility: Sit to Sidelying;Rolling Rolling: Supervision       Sit to sidelying: Supervision General bed mobility comments: cues to use log roll technique  Transfers Overall transfer level: Needs assistance Equipment used: Rolling walker (2 wheeled) Transfers: Sit to/from Stand Sit to Stand: Supervision         General transfer comment: Cues for hand placement/technique.  Ambulation/Gait Ambulation/Gait assistance: Supervision Ambulation Distance (Feet): 200 Feet (x 2) Assistive device: Rolling walker (2 wheeled)       General Gait Details: pt with no LOB, few standing rest breaks due to  fatigue  Stairs            Wheelchair Mobility    Modified Rankin (Stroke Patients Only)       Balance                                             Pertinent Vitals/Pain Pt c/o back pain 2/10, reports she just rec'd pain meds    Home Living Family/patient expects to be discharged to:: Private residence (homeless, lives in abandoned house) Living Arrangements: Spouse/significant other Available Help at Discharge: Family;Available PRN/intermittently Type of Home: House Home Access: Stairs to enter   [employer] of Steps: 4 Home Layout: One level   Additional Comments: homeless, living in abandoned house    Prior Function Level of Independence: Independent               Hand Dominance   Dominant Hand: Right    Extremity/Trunk Assessment   Upper Extremity Assessment: Overall WFL for tasks assessed           Lower Extremity Assessment: Generalized weakness      Cervical / Trunk Assessment:  (spinal brace)  Communication   Communication: No difficulties  Cognition Arousal/Alertness: Awake/alert Behavior During Therapy: WFL for tasks assessed/performed Overall Cognitive Status: Within Functional Limits for tasks assessed                      General Comments General comments (skin integrity, edema, etc.): pt able to perform toilet transfer and hygiene with supervision    Exercises        Assessment/Plan    PT Assessment  Patient needs continued PT services  PT Diagnosis Difficulty walking;Generalized weakness   PT Problem List Decreased activity tolerance;Decreased mobility;Decreased strength  PT Treatment Interventions DME instruction;Gait training;Patient/family education;Stair training;Functional mobility training;Therapeutic activities;Modalities;Therapeutic exercise;Balance training;Neuromuscular re-education   PT Goals (Current goals can be found in the Care Plan section) Acute Rehab PT  Goals Patient Stated Goal: less stiffness PT Goal Formulation: With patient Time For Goal Achievement: 01/08/14 Potential to Achieve Goals: Good    Frequency Min 5X/week   Barriers to discharge Inaccessible home environment;Decreased caregiver support boyfriend works, homeless    Co-evaluation               End of Session Equipment Utilized During Treatment: Back brace Activity Tolerance: Patient tolerated treatment well Patient left: in chair;with call bell/phone within reach Nurse Communication: Mobility status         Time: 1300-1335 PT Time Calculation (min): 35 min   Charges:   PT Evaluation $Initial PT Evaluation Tier I: 1 Procedure PT Treatments $Gait Training: 8-22 mins $Therapeutic Activity: 8-22 mins   PT G Codes:          Kennith Gain 01/01/2014, 1:41 PM

## 2014-01-02 MED ORDER — OXYCODONE-ACETAMINOPHEN 10-325 MG PO TABS: 1.0000 | ORAL_TABLET | ORAL | Status: DC | PRN

## 2014-01-02 NOTE — Discharge Summary (Signed)
  Physician Discharge Summary  Patient ID: Lori Bean MRN: [id number] DOB/AGE: 1971/06/15 43 y.o.  Admit date: 12/28/2013 Discharge date: 01/02/2014  Admission Diagnoses:  Discharge Diagnoses:  Active Problems:   Lumbar spondylosis   Discharged Condition: good  Hospital Course: Surgery 5 days ago with lumbar fusion. Did well. Marked improvement in pain level. Increased activity slowly. Had significant constipation. Eventually resolved. Felt needed supervised place to go. Bed found at SNF, and sent there on pod 5. Markedly improved vs pre op.  Consults: None  Significant Diagnostic Studies: None  Treatments: surgery: L5S1 plif  Discharge Exam: Blood pressure 112/62, pulse 95, temperature 98.2 F (36.8 C), temperature source Oral, resp. rate 20, height 5\' 2"  (1.575 m), weight 101.606 kg (224 lb), SpO2 95.00%. Incision/Wound:clean and dry; no new neuro issues  Disposition: 01-Home or Self Care     Medication List    ASK your doctor about these medications       albuterol (5 MG/ML) 0.5% nebulizer solution  Commonly known as:  PROVENTIL  Take 0.5 mLs (2.5 mg total) by nebulization every 4 (four) hours as needed for wheezing or shortness of breath.     albuterol 108 (90 BASE) MCG/ACT inhaler  Commonly known as:  PROVENTIL HFA;VENTOLIN HFA  Inhale 2 puffs into the lungs every 6 (six) hours as needed for wheezing or shortness of breath.     amLODipine 10 MG tablet  Commonly known as:  NORVASC  Take 1 tablet (10 mg total) by mouth daily.     ARIPiprazole 20 MG tablet  Commonly known as:  ABILIFY  Take 20 mg by mouth at bedtime.     esomeprazole 40 MG capsule  Commonly known as:  NEXIUM  Take 40 mg by mouth daily at 12 noon.     furosemide 40 MG tablet  Commonly known as:  LASIX  Take 1 tablet (40 mg total) by mouth daily.     gabapentin 300 MG capsule  Commonly known as:  NEURONTIN  Take 1 capsule (300 mg total) by mouth 2 (two) times daily.     methocarbamol 750 MG tablet  Commonly known as:  ROBAXIN-750  Take 1 tablet (750 mg total) by mouth 4 (four) times daily.     oxyCODONE-acetaminophen 10-325 MG per tablet  Commonly known as:  PERCOCET  Take 1 tablet by mouth every 4 (four) hours as needed for pain.     predniSONE 10 MG tablet  Commonly known as:  DELTASONE  Take 2 tablets (20 mg total) by mouth daily.     solifenacin 5 MG tablet  Commonly known as:  VESICARE  Take 5 mg by mouth daily.     triamcinolone cream 0.1 %  Commonly known as:  KENALOG  Apply 1 application topically 2 (two) times daily as needed (eczema).         At home rest most of the time. Get up 9 or 10 times each day and take a 15 or 20 minute walk. No riding in the car and to your first postoperative appointment. If you have neck surgery you may shower from the chest down starting on the third postoperative day. If you had back surgery he may start showering on the third postoperative day with saran wrap wrapped around your incisional area 3 times. After the shower remove the saran wrap. Take pain medicine as needed and other medications as instructed. Call my office for an appointment.  Signed: Faythe Ghee, MD 01/02/2014, 3:41 PM

## 2014-01-02 NOTE — Progress Notes (Signed)
Physical Therapy Treatment Patient Details Name: Lori Bean MRN: [id number] DOB: 1970-11-05 Today's Date: 01/29/14    History of Present Illness 43 y.o. s/p POSTERIOR LUMBAR INTERBODY FUSION LUMBAR FIVE-SACRAL ONE, PERCUTANEOUS SCREWS (N/A) - POSTERIOR LUMBAR INTERBODY FUSION LUMBAR FIVE-SACRAL ONE, PERCUTANEOUS SCREWS     PT Comments    Patient ambulating in hall with supervision and rest breaks secondary to increased fatigue.    Follow Up Recommendations  SNF     Equipment Recommendations  Rolling walker with 5" wheels;3in1 (PT);Hospital bed (pt will need equipment if d/c homeless and not SNF)    Recommendations for Other Services       Precautions / Restrictions Precautions Precautions: Back;Fall Precaution Booklet Issued: No Precaution Comments: Reviewed precautions with pt Required Braces or Orthoses: Spinal Brace Spinal Brace: Lumbar corset;Applied in sitting position Restrictions Weight Bearing Restrictions: No    Mobility  Bed Mobility Overal bed mobility: Needs Assistance Bed Mobility: Sit to Sidelying;Rolling Rolling: Supervision       Sit to sidelying: Supervision General bed mobility comments: cues to use log roll technique  Transfers Overall transfer level: Needs assistance Equipment used: Rolling walker (2 wheeled) Transfers: Sit to/from Stand Sit to Stand: Supervision         General transfer comment: Cues for hand placement/technique.  Ambulation/Gait Ambulation/Gait assistance: Supervision Ambulation Distance (Feet): 210 Feet (x2 with rest break ) Assistive device: Rolling walker (2 wheeled)       General Gait Details: Multiple extended rest breaks secondary to fatigue   Stairs            Wheelchair Mobility    Modified Rankin (Stroke Patients Only)       Balance                                    Cognition Arousal/Alertness: Awake/alert Behavior During Therapy: WFL for tasks  assessed/performed Overall Cognitive Status: Within Functional Limits for tasks assessed                      Exercises      General Comments        Pertinent Vitals/Pain 2/10     Home Living                      Prior Function            PT Goals (current goals can now be found in the care plan section) Acute Rehab PT Goals Patient Stated Goal: less stiffness PT Goal Formulation: With patient Time For Goal Achievement: 01/08/14 Potential to Achieve Goals: Good Progress towards PT goals: Progressing toward goals    Frequency  Min 5X/week    PT Plan Current plan remains appropriate    Co-evaluation             End of Session Equipment Utilized During Treatment: Back brace Activity Tolerance: Patient tolerated treatment well Patient left: in chair;with call bell/phone within reach     Time: 0840-0900 PT Time Calculation (min): 20 min  Charges:  $Gait Training: 8-22 mins                    G Codes:      Duncan Dull Jan 29, 2014, 11:01 AM Alben Deeds, PT DPT  [phone]

## 2014-01-02 NOTE — Progress Notes (Signed)
Occupational Therapy Treatment Patient Details Name: Lori Bean MRN: [id number] DOB: 1971-09-12 Today's Date: 01/02/2014    History of present illness 43 y.o. s/p POSTERIOR LUMBAR INTERBODY FUSION LUMBAR FIVE-SACRAL ONE, PERCUTANEOUS SCREWS (N/A) - POSTERIOR LUMBAR INTERBODY FUSION LUMBAR FIVE-SACRAL ONE, PERCUTANEOUS SCREWS    OT comments  Progressing well with skilled OT.  Able to maintain back precautions during functional tasks with some cueing needed. Provided AE kit for use with LB dressing.   Follow Up Recommendations  SNF;Supervision/Assistance - 24 hour    Equipment Recommendations  3 in 1 bedside comode;Other (comment)          Precautions / Restrictions Precautions Precautions: Back;Fall Precaution Booklet Issued: No Precaution Comments: Reviewed precautions with pt Required Braces or Orthoses: Spinal Brace Spinal Brace: Lumbar corset;Applied in sitting position Restrictions Weight Bearing Restrictions: No       Mobility Bed Mobility Overal bed mobility: Needs Assistance Bed Mobility: Sidelying to Sit;Rolling;Sit to Supine;Sit to Sidelying Rolling: Supervision Sidelying to sit: Supervision   Sit to supine: Supervision Sit to sidelying: Supervision General bed mobility comments: cues to use log roll technique, increased time to transtion into sitting onces b les off of bed, hob flat no rails  Transfers Overall transfer level: Needs assistance Equipment used: Rolling walker (2 wheeled) Transfers: Sit to/from Stand Sit to Stand: Supervision         General transfer comment: Cues for hand placement/technique.                                       ADL Overall ADL's : Needs assistance/impaired             Lower Body Bathing: Min guard;Cueing for compensatory techniques;Cueing for back precautions;Sit to/from stand   Upper Body Dressing : Set up;Supervision/safety;Sitting   Lower Body Dressing: Minimal assistance;With  adaptive equipment;Sit to/from stand;Cueing for back precautions;Cueing for compensatory techniques   Toilet Transfer: Min guard;Ambulation;RW   Toileting- Clothing Manipulation and Hygiene: Supervision/safety;Sit to/from stand;Cueing for back precautions       Functional mobility during ADLs: Min guard;Rolling walker General ADL Comments: provided AE for use.  pt. able to return demo of how to use.  completed LB bathe and dress in b.room with cues for maintaining back precautions                                      Cognition   Behavior During Therapy: [facility] for tasks assessed/performed Overall Cognitive Status: Within Functional Limits for tasks assessed                                                        Pertinent Vitals/ Pain       7/10, repositioned at end of session pt. States she was very comfortable                                                          Frequency Min 2X/week     Progress Toward  Goals  OT Goals(current goals can now be found in the care plan section)  Progress towards OT goals: Progressing toward goals  Acute Rehab OT Goals Patient Stated Goal: less stiffness ADL Goals Pt Will Perform Lower Body Bathing: with modified independence;with adaptive equipment;sit to/from stand Pt Will Perform Lower Body Dressing: with modified independence;with adaptive equipment;sit to/from stand Pt Will Transfer to Toilet: with modified independence;ambulating;bedside commode Pt Will Perform Toileting - Clothing Manipulation and hygiene: with modified independence;sit to/from stand Additional ADL Goal #1: Pt will verbalize and demonstrate 3/3 back precautions during functional mobility and ADLs.  Plan Discharge plan remains appropriate                     End of Session Equipment Utilized During Treatment: Gait belt;Rolling walker;Back brace   Activity Tolerance Patient tolerated treatment  well   Patient Left in bed;with call bell/phone within reach;with bed alarm set             Time: 1000-1023 OT Time Calculation (min): 23 min  Charges: OT General Charges $OT Visit: 1 Procedure OT Treatments $Self Care/Home Management : 23-37 mins  Rico Junker Taygen Acklin, COTA/L 01/02/2014, 11:45 AM

## 2014-01-02 NOTE — Progress Notes (Signed)
Patient ID: Lori Bean, female   DOB: 05/18/1971, 43 y.o.   MRN: [id number] Afeb, vss Still slow to increase activity. Has limited resources at home, so i am not yet comfortable with her being discharged. Wuill see how she feels tomorrow.

## 2014-01-02 NOTE — Progress Notes (Signed)
Pt d/c to snf by car with family. Assessment stable. Prescriptions given. Report called to SNF.

## 2014-01-02 NOTE — Clinical Social Work Note (Signed)
CSW spoke with pt at bedside regarding discharge disposition. Pt is agreeable to discharge home once medically stable for discharge. Pt informed CSW of pt's ability to visit pt's mother and utilize pt's mothers running water in order to clean pt's wound. CSW provided pt with housing resources in [place]. Pt to be transported home at time of discharge via pt's boyfriend ([place]).   RNCM and CSW consulted regarding possible equipment needs at time of discharge. RNCM to speak with MD.  Pati Gallo, [place] Social Worker [phone]

## 2014-01-02 NOTE — Clinical Social Work Placement (Signed)
Clinical Social Work Department CLINICAL SOCIAL WORK PLACEMENT NOTE 01/02/2014  Patient:  Lori Bean, Lori Bean  Account Number:  [id number] [place] date:  12/28/2013  Clinical Social Worker:  Raquel Sarna [place], LCSWA  Date/time:  01/02/2014 03:26 PM  Clinical Social Work is seeking post-discharge placement for this patient at the following level of care:   [place]   (*CSW will update this form in Epic as items are completed)   01/02/2014  Patient/family provided with [facility] [place] Department of Clinical Social Works list of facilities offering this level of care within the geographic area requested by the patient (or if unable, by the patients family).  01/02/2014  Patient/family informed of their freedom to choose among providers that offer the needed level of care, that participate in Medicare, Medicaid or managed care program needed by the patient, have an available bed and are willing to accept the patient.  01/02/2014  Patient/family informed of [facility] ownership interest in [facility], as well as of the fact that they are under no obligation to receive care at this facility.  PASARR submitted to EDS on 01/02/2014 PASARR number received from EDS on 01/02/2014  FL2 transmitted to all facilities in geographic area requested by pt/family on  01/02/2014 FL2 transmitted to all facilities within larger geographic area on   Patient informed that his/her managed care company has contracts with or will negotiate with  certain facilities, including the following:     Patient/family informed of bed offers received:  01/02/2014 Patient chooses bed at [facility] Physician recommends and patient chooses bed at    Patient to be transferred to [facility] on  01/02/2014 Patient to be transferred to facility by [facility]  The following physician request were entered in Epic:   Additional Comments:  Pati Gallo,  [place] Worker [phone]

## 2014-01-20 ENCOUNTER — Emergency Department (HOSPITAL_COMMUNITY): Payer: Medicaid Other

## 2014-01-20 ENCOUNTER — Emergency Department (HOSPITAL_COMMUNITY)
Admission: EM | Admit: 2014-01-20 | Discharge: 2014-01-20 | Disposition: A | Discharge status: Home / Self Care | Payer: Medicaid Other | Attending: Emergency Medicine | Admitting: Emergency Medicine

## 2014-01-20 ENCOUNTER — Encounter (HOSPITAL_COMMUNITY): Payer: Self-pay | Admitting: Emergency Medicine

## 2014-01-20 DIAGNOSIS — K219 Gastro-esophageal reflux disease without esophagitis: Secondary | ICD-10-CM | POA: Insufficient documentation

## 2014-01-20 DIAGNOSIS — M7989 Other specified soft tissue disorders: Secondary | ICD-10-CM

## 2014-01-20 DIAGNOSIS — R209 Unspecified disturbances of skin sensation: Secondary | ICD-10-CM | POA: Insufficient documentation

## 2014-01-20 DIAGNOSIS — F209 Schizophrenia, unspecified: Secondary | ICD-10-CM | POA: Insufficient documentation

## 2014-01-20 DIAGNOSIS — F17200 Nicotine dependence, unspecified, uncomplicated: Secondary | ICD-10-CM | POA: Insufficient documentation

## 2014-01-20 DIAGNOSIS — Z8709 Personal history of other diseases of the respiratory system: Secondary | ICD-10-CM | POA: Insufficient documentation

## 2014-01-20 DIAGNOSIS — IMO0002 Reserved for concepts with insufficient information to code with codable children: Secondary | ICD-10-CM | POA: Insufficient documentation

## 2014-01-20 DIAGNOSIS — I509 Heart failure, unspecified: Secondary | ICD-10-CM | POA: Insufficient documentation

## 2014-01-20 DIAGNOSIS — F3189 Other bipolar disorder: Secondary | ICD-10-CM | POA: Insufficient documentation

## 2014-01-20 DIAGNOSIS — I209 Angina pectoris, unspecified: Secondary | ICD-10-CM | POA: Insufficient documentation

## 2014-01-20 DIAGNOSIS — J45909 Unspecified asthma, uncomplicated: Secondary | ICD-10-CM | POA: Insufficient documentation

## 2014-01-20 DIAGNOSIS — M129 Arthropathy, unspecified: Secondary | ICD-10-CM | POA: Insufficient documentation

## 2014-01-20 DIAGNOSIS — Z79899 Other long term (current) drug therapy: Secondary | ICD-10-CM | POA: Insufficient documentation

## 2014-01-20 DIAGNOSIS — Z9889 Other specified postprocedural states: Secondary | ICD-10-CM | POA: Insufficient documentation

## 2014-01-20 DIAGNOSIS — I10 Essential (primary) hypertension: Secondary | ICD-10-CM | POA: Insufficient documentation

## 2014-01-20 DIAGNOSIS — F411 Generalized anxiety disorder: Secondary | ICD-10-CM | POA: Insufficient documentation

## 2014-01-20 DIAGNOSIS — Z87448 Personal history of other diseases of urinary system: Secondary | ICD-10-CM | POA: Insufficient documentation

## 2014-01-20 DIAGNOSIS — I82409 Acute embolism and thrombosis of unspecified deep veins of unspecified lower extremity: Secondary | ICD-10-CM | POA: Insufficient documentation

## 2014-01-20 DIAGNOSIS — R011 Cardiac murmur, unspecified: Secondary | ICD-10-CM | POA: Insufficient documentation

## 2014-01-20 DIAGNOSIS — I824Z2 Acute embolism and thrombosis of unspecified deep veins of left distal lower extremity: Secondary | ICD-10-CM

## 2014-01-20 DIAGNOSIS — M79609 Pain in unspecified limb: Secondary | ICD-10-CM

## 2014-01-20 LAB — CBC WITH DIFFERENTIAL/PLATELET
Basophils Absolute: 0 10*3/uL (ref 0.0–0.1)
Basophils Relative: 0 % (ref 0–1)
Eosinophils Absolute: 0.5 10*3/uL (ref 0.0–0.7)
Eosinophils Relative: 6 % — ABNORMAL HIGH (ref 0–5)
HCT: 37.7 % (ref 36.0–46.0)
Hemoglobin: 12.5 g/dL (ref 12.0–15.0)
Lymphocytes Relative: 27 % (ref 12–46)
Lymphs Abs: 2.6 10*3/uL (ref 0.7–4.0)
MCH: 30.6 pg (ref 26.0–34.0)
MCHC: 33.2 g/dL (ref 30.0–36.0)
MCV: 92.2 fL (ref 78.0–100.0)
Monocytes Absolute: 0.8 10*3/uL (ref 0.1–1.0)
Monocytes Relative: 9 % (ref 3–12)
Neutro Abs: 5.6 10*3/uL (ref 1.7–7.7)
Neutrophils Relative %: 58 % (ref 43–77)
Platelets: 200 10*3/uL (ref 150–400)
RBC: 4.09 MIL/uL (ref 3.87–5.11)
RDW: 13.6 % (ref 11.5–15.5)
WBC: 9.6 10*3/uL (ref 4.0–10.5)

## 2014-01-20 LAB — BASIC METABOLIC PANEL
BUN: 12 mg/dL (ref 6–23)
CO2: 26 mEq/L (ref 19–32)
Calcium: 9.3 mg/dL (ref 8.4–10.5)
Chloride: 101 mEq/L (ref 96–112)
Creatinine, Ser: 0.83 mg/dL (ref 0.50–1.10)
GFR calc Af Amer: >90 mL/min (ref >90)
GFR calc non Af Amer: 85 mL/min — ABNORMAL LOW (ref >90)
Glucose, Bld: 112 mg/dL — ABNORMAL HIGH (ref 70–99)
Potassium: 3.7 mEq/L (ref 3.7–5.3)
Sodium: 140 mEq/L (ref 137–147)

## 2014-01-20 MED ORDER — OXYCODONE-ACETAMINOPHEN 5-325 MG PO TABS
1.0000 | ORAL_TABLET | Freq: Once | ORAL | Status: AC
  Administered 2014-01-20: 1 via ORAL
  Filled 2014-01-20: qty 1

## 2014-01-20 MED ORDER — OXYCODONE-ACETAMINOPHEN 5-325 MG PO TABS: 1.0000 | ORAL_TABLET | Freq: Four times a day (QID) | ORAL | Status: DC | PRN

## 2014-01-20 MED ORDER — RIVAROXABAN (XARELTO) EDUCATION KIT FOR DVT/PE PATIENTS
PACK | Freq: Once | Status: AC
  Administered 2014-01-20: 13:00:00
  Filled 2014-01-20: qty 1

## 2014-01-20 MED ORDER — RIVAROXABAN 15 MG PO TABS
15.0000 mg | ORAL_TABLET | Freq: Once | ORAL | Status: AC
  Administered 2014-01-20: 15 mg via ORAL
  Filled 2014-01-20: qty 1

## 2014-01-20 MED ORDER — XARELTO VTE STARTER PACK 15 & 20 MG PO TBPK: 15.0000 mg | ORAL_TABLET | ORAL | Status: DC

## 2014-01-20 NOTE — Progress Notes (Signed)
*  Preliminary Results* Left lower extremity venous duplex completed. Left lower extremity is positive for deep vein thrombosis involving the left popliteal, posterior tibial, and peroneal veins. There is no evidence of left Baker's cyst.  Preliminary results discussed with Dr.Docherty.  01/20/2014 11:39 AM  Maudry Mayhew, RVT, RDCS, RDMS

## 2014-01-20 NOTE — ED Notes (Signed)
Had back surgery on 4/16 had to have rods and screws in her back and  Since then her left foot and ankle have been swollen  Hurts to have someone touch it  Has been back to dr about swelling but she cannot take the pain. Has hx chf has been sob also

## 2014-01-20 NOTE — ED Notes (Signed)
Pt states understanding of education packet

## 2014-01-20 NOTE — ED Provider Notes (Signed)
CSN: [id number]     Arrival date & time 01/20/14  0848 History   First MD Initiated Contact with Patient 01/20/14 [phone]     Chief Complaint  Patient presents with  . Leg Pain     (Consider location/radiation/quality/duration/timing/severity/associated sxs/prior Treatment) Patient is a 43 y.o. female presenting with leg pain. The history is provided by the patient. No language interpreter was used.  Leg Pain Location:  Leg and ankle (foot) Time since incident:  3 weeks Injury: no   Leg location:  L lower leg (L foot) Ankle location:  L ankle Pain details:    Quality:  Aching   Severity:  Severe   Onset quality:  Gradual   Duration:  3 weeks   Timing:  Constant   Progression:  Worsening Chronicity:  New Dislocation: no   Foreign body present:  No foreign bodies Tetanus status:  Up to date Prior injury to area:  Unable to specify (Maybe twisted the ankle) Relieved by:  Rest Worsened by:  Bearing weight and activity (palpation) Ineffective treatments:  Elevation and rest Associated symptoms: numbness and swelling   Associated symptoms: no back pain, no fatigue, no fever and no neck pain   Risk factors: no concern for non-accidental trauma, no frequent fractures, no known bone disorder, no obesity and no recent illness     Past Medical History  Diagnosis Date  . Asthma   . Hypertension   . Anxiety   . Snoring disorder     Pt stated my boyfriend always wakes me up and tells me to breathe  . Heart murmur   . CHF (congestive heart failure)   . Shortness of breath   . Bronchitis     hx of  . Bipolar affective disorder     Schizophrenia  . Depression   . GERD (gastroesophageal reflux disease)   . Arthritis   . Overactive bladder   . Schizophrenia   . Sciatica   . Anginal pain     occ from asthma   Past Surgical History  Procedure Laterality Date  . Rotator cuff repair      Right shoulder  . Tubal ligation    . Gallstones reomved    . Lumbar  laminectomy/decompression microdiscectomy  04/01/2012    Procedure: LUMBAR LAMINECTOMY/DECOMPRESSION MICRODISCECTOMY 2 LEVELS;  Surgeon: Faythe Ghee, MD;  Location: [facility];  Service: Neurosurgery;  Laterality: Left;  Lumbar four-five, lumbar five sacral one microdiscectomy   . Back surgery    . Lumbar wound debridement  04/29/2012    Procedure: LUMBAR WOUND DEBRIDEMENT;  Surgeon: Faythe Ghee, MD;  Location: [facility];  Service: Neurosurgery;  Laterality: N/A;  lumbar wound debridement  . Cholecystectomy    . Eye surgery      Metal plate in right eye   No family history on file. History  Substance Use Topics  . Smoking status: Current Every Day Smoker -- 0.25 packs/day for 9 years    Types: Cigarettes  . Smokeless tobacco: Never Used  . Alcohol Use: Yes     Comment: One 40 oz. beer/day average--some tequilla   OB History   Grav Para Term Preterm Abortions TAB SAB Ect Mult Living                 Review of Systems  Constitutional: Negative for fever, chills, diaphoresis, activity change, appetite change and fatigue.  HENT: Negative for congestion, facial swelling, rhinorrhea and sore throat.   Eyes: Negative for photophobia  and discharge.  Respiratory: Negative for cough, chest tightness and shortness of breath.   Cardiovascular: Negative for chest pain, palpitations and leg swelling.  Gastrointestinal: Negative for nausea, vomiting, abdominal pain and diarrhea.  Endocrine: Negative for polydipsia and polyuria.  Genitourinary: Negative for dysuria, frequency, difficulty urinating and pelvic pain.  Musculoskeletal: Negative for arthralgias, back pain, neck pain and neck stiffness.  Skin: Negative for color change and wound.  Allergic/Immunologic: Negative for immunocompromised state.  Neurological: Positive for numbness. Negative for facial asymmetry, weakness and headaches.  Hematological: Does not bruise/bleed easily.  Psychiatric/Behavioral: Negative for confusion  and agitation.      Allergies  Aspirin  Home Medications   Prior to Admission medications   Medication Sig Start Date End Date Taking? Authorizing Provider  albuterol (PROVENTIL HFA;VENTOLIN HFA) 108 (90 BASE) MCG/ACT inhaler Inhale 2 puffs into the lungs every 6 (six) hours as needed for wheezing or shortness of breath.   Yes Historical Provider, MD  albuterol (PROVENTIL) (5 MG/ML) 0.5% nebulizer solution Take 0.5 mLs (2.5 mg total) by nebulization every 4 (four) hours as needed for wheezing or shortness of breath. 08/24/13  Yes Hannah Muthersbaugh, PA-C  amLODipine (NORVASC) 10 MG tablet Take 1 tablet (10 mg total) by mouth daily. 06/07/13  Yes Robbie Lis, MD  ARIPiprazole (ABILIFY) 20 MG tablet Take 20 mg by mouth at bedtime.   Yes Historical Provider, MD  esomeprazole (NEXIUM) 40 MG capsule Take 40 mg by mouth daily at 12 noon.   Yes Historical Provider, MD  furosemide (LASIX) 40 MG tablet Take 1 tablet (40 mg total) by mouth daily. 06/07/13  Yes Robbie Lis, MD  gabapentin (NEURONTIN) 300 MG capsule Take 1 capsule (300 mg total) by mouth 2 (two) times daily. 06/07/13  Yes Robbie Lis, MD  methocarbamol (ROBAXIN-750) 750 MG tablet Take 1 tablet (750 mg total) by mouth 4 (four) times daily. 11/01/13  Yes Kalman Drape, MD  oxyCODONE-acetaminophen (PERCOCET) 10-325 MG per tablet Take 1-2 tablets by mouth every 4 (four) hours as needed for pain. 01/02/14  Yes Charlie Pitter, MD  predniSONE (DELTASONE) 10 MG tablet Take 2 tablets (20 mg total) by mouth daily. 11/12/13  Yes Tiffany Marilu Favre, PA-C  solifenacin (VESICARE) 5 MG tablet Take 5 mg by mouth daily.   Yes Historical Provider, MD  triamcinolone cream (KENALOG) 0.1 % Apply 1 application topically 2 (two) times daily as needed (eczema).   Yes Historical Provider, MD   BP 118/61  Pulse 98  Temp(Src) 98.8 F (37.1 C)  Resp 22  SpO2 97%  LMP 12/30/2013 Physical Exam  Constitutional: She is oriented to person, place, and time. She  appears well-developed and well-nourished. No distress.  HENT:  Head: Normocephalic and atraumatic.  Mouth/Throat: No oropharyngeal exudate.  Eyes: Pupils are equal, round, and reactive to light.  Neck: Normal range of motion. Neck supple.  Cardiovascular: Normal rate, regular rhythm and normal heart sounds.  Exam reveals no gallop and no friction rub.   No murmur heard. Pulmonary/Chest: Effort normal and breath sounds normal. No respiratory distress. She has no wheezes. She has no rales.  Abdominal: Soft. Bowel sounds are normal. She exhibits no distension and no mass. There is no tenderness. There is no rebound and no guarding.  Musculoskeletal: Normal range of motion. She exhibits no edema.       Left ankle: She exhibits swelling. Tenderness.       Right lower leg: She exhibits tenderness and swelling.  Left foot: She exhibits tenderness and swelling.  Neurological: She is alert and oriented to person, place, and time.  Dec sensation L foot and lateral L lower leg  Skin: Skin is warm and dry.  Psychiatric: She has a normal mood and affect.    ED Course  Procedures (including critical care time) Labs Review Labs Reviewed  CBC WITH DIFFERENTIAL - Abnormal; Notable for the following:    Eosinophils Relative 6 (*)    All other components within normal limits  BASIC METABOLIC PANEL - Abnormal; Notable for the following:    Glucose, Bld 112 (*)    GFR calc non Af Amer 85 (*)    All other components within normal limits    Imaging Review Dg Ankle Complete Left  01/20/2014   CLINICAL DATA:  L foot/ankle pain/swelling  EXAM: LEFT ANKLE COMPLETE - 3+ VIEW  COMPARISON:  None.  FINDINGS: There is no evidence of fracture, dislocation, or joint effusion. There is no evidence of arthropathy or other focal bone abnormality. Soft tissue swelling in the medial and lateral malleolar regions.  IMPRESSION: No acute osseous abnormalities. Soft tissue swelling is appreciated in the medial and  lateral malleolar regions.   Electronically Signed   By: Margaree Mackintosh M.D.   On: 01/20/2014 10:14   Dg Foot 2 Views Left  01/20/2014   CLINICAL DATA:  L foot/ankle pain/swelling  EXAM: LEFT FOOT - 2 VIEW  COMPARISON:  None.  FINDINGS: There is no evidence of fracture or dislocation. There is no evidence of arthropathy or other focal bone abnormality. Soft tissues are unremarkable.  IMPRESSION: Negative.   Electronically Signed   By: Margaree Mackintosh M.D.   On: 01/20/2014 10:15     EKG Interpretation None      MDM   Final diagnoses:  Acute deep vein thrombosis (DVT) of distal vein of left lower extremity    Pt is a 43 y.o. female with Pmhx as above who presents with about 3 weeks of LLE pain/swelling in the foot, ankle, and lower leg after a back surgery 4/16. She also reports dec sensation of the foot. She states she had some swelling beforehand which she takes lasix for, but that this is worse. She denies fever, chills, cough, fever, inc SOB above her baseline COPD symptoms. On PE, Pt in NAD, VSS.  +ttp and edema of L foot/ankle/LE. She states the position of her second toe looks different and maybe twisted it at some point. Have ordered XR foot/ankle to r/o acute fracture, but have also ordered DVT study LLE due to recent surgical hx.     XRs negative, DVT study positive. Doubt PE given no respiratory symptoms. Had discussion w/ pt about coumadin v Xarelto, and pt has opted for Xarelto treatment. She will call her PCP Monday for f/u appt and will return to the ED for new or worsening symptoms.       Neta Ehlers, MD 01/20/14 2121

## 2014-01-20 NOTE — Discharge Instructions (Signed)
Deep Vein Thrombosis  A deep vein thrombosis (DVT) is a blood clot that develops in the deep, larger veins of the leg, arm, or pelvis. These are more dangerous than clots that might form in veins near the surface of the body. A DVT can lead to complications if the clot breaks off and travels in the bloodstream to the lungs.   A DVT can damage the valves in your leg veins, so that instead of flowing upward, the blood pools in the lower leg. This is called post-thrombotic syndrome, and it can result in pain, swelling, discoloration, and sores on the leg.  CAUSES  Usually, several things contribute to blood clots forming. Contributing factors include:   The flow of blood slows down.   The inside of the vein is damaged in some way.   You have a condition that makes blood clot more easily.  RISK FACTORS  Some people are more likely than others to develop blood clots. Risk factors include:    Older age, especially over 75 years of age.   Having a family history of blood clots or if you have already had a blot clot.   Having major or lengthy surgery. This is especially true for surgery on the hip, knee, or belly (abdomen). Hip surgery is particularly high risk.   Breaking a hip or leg.   Sitting or lying still for a long time. This includes long-distance travel, paralysis, or recovery from an illness or surgery.   Having cancer or cancer treatment.   Having a long, thin tube (catheter) placed inside a vein during a medical procedure.   Being overweight (obese).   Pregnancy and childbirth.   Hormone changes make the blood clot more easily during pregnancy.   The fetus puts pressure on the veins of the pelvis.   There is a risk of injury to veins during delivery or a caesarean. The risk is highest just after childbirth.   Medicines with the female hormone estrogen. This includes birth control pills and hormone replacement therapy.   Smoking.   Other circulation or heart problems.    SIGNS AND SYMPTOMS  When  a clot forms, it can either partially or totally block the blood flow in that vein. Symptoms of a DVT can include:   Swelling of the leg or arm, especially if one side is much worse.   Warmth and redness of the leg or arm, especially if one side is much worse.   Pain in an arm or leg. If the clot is in the leg, symptoms may be more noticeable or worse when standing or walking.  The symptoms of a DVT that has traveled to the lungs (pulmonary embolism, PE) usually start suddenly and include:   Shortness of breath.   Coughing.   Coughing up blood or blood-tinged phlegm.   Chest pain. The chest pain is often worse with deep breaths.   Rapid heartbeat.  Anyone with these symptoms should get emergency medical treatment right away. Call your local emergency services (911 in the [place] if you have these symptoms.  DIAGNOSIS  If a DVT is suspected, your health care provider will take a full medical history and perform a physical exam. Tests that also may be required include:   Blood tests, including studies of the clotting properties of the blood.   Ultrasonography to see if you have clots in your legs or lungs.   X-rays to show the flow of blood when dye is injected into the veins (  venography).   Studies of your lungs if you have any chest symptoms.  PREVENTION   Exercise the legs regularly. Take a brisk 30-minute walk every day.   Maintain a weight that is appropriate for your height.   Avoid sitting or lying in bed for long periods of time without moving your legs.   Women, particularly those over the age of 35 years, should consider the risks and benefits of taking estrogen medicines, including birth control pills.   Do not smoke, especially if you take estrogen medicines.   Long-distance travel can increase your risk of DVT. You should exercise your legs by walking or pumping the muscles every hour.   In-hospital prevention:   Many of the risk factors above relate to situations that exist with  hospitalization, either for illness, injury, or elective surgery.   Your health care provider will assess you for the need for venous thromboembolism prophylaxis when you are admitted to the hospital. If you are having surgery, your surgeon will assess you the day of or day after surgery.   Prevention may include medical and nonmedical measures.  TREATMENT  Once identified, a DVT can be treated. It can also be prevented in some circumstances. Once you have had a DVT, you may be at increased risk for a DVT in the future. The most common treatment for DVT is blood thinning (anticoagulant) medicine, which reduces the blood's tendency to clot. Anticoagulants can stop new blood clots from forming and stop old ones from growing. They cannot dissolve existing clots. Your body does this by itself over time. Anticoagulants can be given by mouth, by IV access, or by injection. Your health care provider will determine the best program for you. Other medicines or treatments that may be used are:   Heparin or related medicines (low molecular weight heparin) are usually the first treatment for a blood clot. They act quickly. However, they cannot be taken orally.   Heparin can cause a fall in a component of blood that stops bleeding and forms blood clots (platelets). You will be monitored with blood tests to be sure this does not occur.   Warfarin is an anticoagulant that can be swallowed. It takes a few days to start working, so usually heparin or related medicines are used in combination. Once warfarin is working, heparin is usually stopped.   Less commonly, clot dissolving drugs (thrombolytics) are used to dissolve a DVT. They carry a high risk of bleeding, so they are used mainly in severe cases, where your life or a limb is threatened.   Very rarely, a blood clot in the leg needs to be removed surgically.   If you are unable to take anticoagulants, your health care provider may arrange for you to have a filter placed  in a main vein in your abdomen. This filter prevents clots from traveling to your lungs.  HOME CARE INSTRUCTIONS   Take all medicines prescribed by your health care provider. Only take over-the-counter or prescription medicines for pain, fever, or discomfort as directed by your health care provider.   Warfarin. Most people will continue taking warfarin after hospital discharge. Your health care provider will advise you on the length of treatment (usually 3 6 months, sometimes lifelong).   Too much and too little warfarin are both dangerous. Too much warfarin increases the risk of bleeding. Too little warfarin continues to allow the risk for blood clots. While taking warfarin, you will need to have regular blood tests to measure your   blood clotting time. These blood tests usually include both the prothrombin time (PT) and international normalized ratio (INR) tests. The PT and INR results allow your health care provider to adjust your dose of warfarin. The dose can change for many reasons. It is critically important that you take warfarin exactly as prescribed, and that you have your PT and INR levels drawn exactly as directed.   Many foods, especially foods high in vitamin K, can interfere with warfarin and affect the PT and INR results. Foods high in vitamin K include spinach, kale, broccoli, cabbage, collard and turnip greens, brussel sprouts, peas, cauliflower, seaweed, and parsley as well as beef and pork liver, green tea, and soybean oil. You should eat a consistent amount of foods high in vitamin K. Avoid major changes in your diet, or notify your health care provider before changing your diet. Arrange a visit with a dietitian to answer your questions.   Many medicines can interfere with warfarin and affect the PT and INR results. You must tell your health care provider about any and all medicines you take. This includes all vitamins and supplements. Be especially cautious with aspirin and  anti-inflammatory medicines. Ask your health care provider before taking these. Do not take or discontinue any prescribed or over-the-counter medicine except on the advice of your health care provider or pharmacist.   Warfarin can have side effects, primarily excessive bruising or bleeding. You will need to hold pressure over cuts for longer than usual. Your health care provider or pharmacist will discuss other potential side effects.   Alcohol can change the body's ability to handle warfarin. It is best to avoid alcoholic drinks or consume only very small amounts while taking warfarin. Notify your health care provider if you change your alcohol intake.   Notify your dentist or other health care providers before procedures.   Activity. Ask your health care provider how soon you can go back to normal activities. It is important to stay active to prevent blood clots. If you are on anticoagulant medicine, avoid contact sports.   Exercise. It is very important to exercise. This is especially important while traveling, sitting, or standing for long periods of time. Exercise your legs by walking or by pumping the muscles frequently. Take frequent walks.   Compression stockings. These are tight elastic stockings that apply pressure to the lower legs. This pressure can help keep the blood in the legs from clotting. You may need to wear compression stockings at home to help prevent a DVT.   Do not smoke. If you smoke, quit. Ask your health care provider for help with quitting smoking.   Learn as much as you can about DVT. Knowing more about the condition should help you keep it from coming back.   Wear a medical alert bracelet or carry a medical alert card.  SEEK MEDICAL CARE IF:   You notice a rapid heartbeat.   You feel weaker or more tired than usual.   You feel faint.   You notice increased bruising.   You feel your symptoms are not getting better in the time expected.   You believe you are having side  effects of medicine.  SEEK IMMEDIATE MEDICAL CARE IF:   You have chest pain.   You have trouble breathing.   You have new or increased swelling or pain in one leg.   You cough up blood.   You notice blood in vomit, in a bowel movement, or in urine.  MAKE SURE   YOU:   Understand these instructions.   Will watch your condition.   Will get help right away if you are not doing well or get worse.  Document Released: 08/31/2005 Document Revised: 06/21/2013 Document Reviewed: 05/08/2013  [employer] Patient Information 2014 [employer]

## 2014-01-20 NOTE — ED Notes (Signed)
[place] here to do doppler pt states pain is bad still

## 2014-01-20 NOTE — ED Notes (Signed)
C/o pain 

## 2014-01-26 ENCOUNTER — Encounter (HOSPITAL_COMMUNITY): Payer: Self-pay | Admitting: Emergency Medicine

## 2014-01-26 DIAGNOSIS — N920 Excessive and frequent menstruation with regular cycle: Secondary | ICD-10-CM | POA: Insufficient documentation

## 2014-01-26 DIAGNOSIS — IMO0002 Reserved for concepts with insufficient information to code with codable children: Secondary | ICD-10-CM | POA: Insufficient documentation

## 2014-01-26 DIAGNOSIS — Z79899 Other long term (current) drug therapy: Secondary | ICD-10-CM | POA: Insufficient documentation

## 2014-01-26 DIAGNOSIS — F209 Schizophrenia, unspecified: Secondary | ICD-10-CM | POA: Insufficient documentation

## 2014-01-26 DIAGNOSIS — I509 Heart failure, unspecified: Secondary | ICD-10-CM | POA: Insufficient documentation

## 2014-01-26 DIAGNOSIS — K219 Gastro-esophageal reflux disease without esophagitis: Secondary | ICD-10-CM | POA: Insufficient documentation

## 2014-01-26 DIAGNOSIS — F319 Bipolar disorder, unspecified: Secondary | ICD-10-CM | POA: Insufficient documentation

## 2014-01-26 DIAGNOSIS — I209 Angina pectoris, unspecified: Secondary | ICD-10-CM | POA: Insufficient documentation

## 2014-01-26 DIAGNOSIS — Z86718 Personal history of other venous thrombosis and embolism: Secondary | ICD-10-CM | POA: Insufficient documentation

## 2014-01-26 DIAGNOSIS — J45909 Unspecified asthma, uncomplicated: Secondary | ICD-10-CM | POA: Insufficient documentation

## 2014-01-26 DIAGNOSIS — M129 Arthropathy, unspecified: Secondary | ICD-10-CM | POA: Insufficient documentation

## 2014-01-26 DIAGNOSIS — F17200 Nicotine dependence, unspecified, uncomplicated: Secondary | ICD-10-CM | POA: Insufficient documentation

## 2014-01-26 DIAGNOSIS — I10 Essential (primary) hypertension: Secondary | ICD-10-CM | POA: Insufficient documentation

## 2014-01-26 DIAGNOSIS — F411 Generalized anxiety disorder: Secondary | ICD-10-CM | POA: Insufficient documentation

## 2014-01-26 DIAGNOSIS — R011 Cardiac murmur, unspecified: Secondary | ICD-10-CM | POA: Insufficient documentation

## 2014-01-26 DIAGNOSIS — Z7901 Long term (current) use of anticoagulants: Secondary | ICD-10-CM | POA: Insufficient documentation

## 2014-01-26 NOTE — ED Notes (Signed)
Pt. reports heavy menstrual bleeding ( 2nd day )  with blood clots today , denies abdominal pain .

## 2014-01-27 ENCOUNTER — Emergency Department (HOSPITAL_COMMUNITY)
Admission: EM | Admit: 2014-01-27 | Discharge: 2014-01-27 | Disposition: A | Discharge status: Home / Self Care | Payer: Medicaid Other | Attending: Emergency Medicine | Admitting: Emergency Medicine

## 2014-01-27 DIAGNOSIS — Z7901 Long term (current) use of anticoagulants: Secondary | ICD-10-CM

## 2014-01-27 DIAGNOSIS — N920 Excessive and frequent menstruation with regular cycle: Secondary | ICD-10-CM

## 2014-01-27 LAB — COMPREHENSIVE METABOLIC PANEL
ALT: 12 U/L (ref 0–35)
AST: 7 U/L (ref 0–37)
Albumin: 3.3 g/dL — ABNORMAL LOW (ref 3.5–5.2)
Alkaline Phosphatase: 87 U/L (ref 39–117)
BUN: 10 mg/dL (ref 6–23)
CO2: 25 mEq/L (ref 19–32)
Calcium: 9.4 mg/dL (ref 8.4–10.5)
Chloride: 103 mEq/L (ref 96–112)
Creatinine, Ser: 0.86 mg/dL (ref 0.50–1.10)
GFR calc Af Amer: >90 mL/min (ref >90)
GFR calc non Af Amer: 82 mL/min — ABNORMAL LOW (ref >90)
Glucose, Bld: 102 mg/dL — ABNORMAL HIGH (ref 70–99)
Potassium: 4 mEq/L (ref 3.7–5.3)
Sodium: 139 mEq/L (ref 137–147)
Total Bilirubin: 0.2 mg/dL — ABNORMAL LOW (ref 0.3–1.2)
Total Protein: 7.2 g/dL (ref 6.0–8.3)

## 2014-01-27 LAB — CBC WITH DIFFERENTIAL/PLATELET
Basophils Absolute: 0 10*3/uL (ref 0.0–0.1)
Basophils Relative: 0 % (ref 0–1)
Eosinophils Absolute: 0.3 10*3/uL (ref 0.0–0.7)
Eosinophils Relative: 4 % (ref 0–5)
HCT: 35.1 % — ABNORMAL LOW (ref 36.0–46.0)
Hemoglobin: 11.7 g/dL — ABNORMAL LOW (ref 12.0–15.0)
Lymphocytes Relative: 31 % (ref 12–46)
Lymphs Abs: 2.3 10*3/uL (ref 0.7–4.0)
MCH: 30.1 pg (ref 26.0–34.0)
MCHC: 33.3 g/dL (ref 30.0–36.0)
MCV: 90.2 fL (ref 78.0–100.0)
Monocytes Absolute: 0.7 10*3/uL (ref 0.1–1.0)
Monocytes Relative: 10 % (ref 3–12)
Neutro Abs: 4.1 10*3/uL (ref 1.7–7.7)
Neutrophils Relative %: 55 % (ref 43–77)
Platelets: 294 10*3/uL (ref 150–400)
RBC: 3.89 MIL/uL (ref 3.87–5.11)
RDW: 13.4 % (ref 11.5–15.5)
WBC: 7.6 10*3/uL (ref 4.0–10.5)

## 2014-01-27 LAB — SAMPLE TO BLOOD BANK

## 2014-01-27 NOTE — Discharge Instructions (Signed)
Expect to have heavier menstrual bleeding while on Xarelto.  Return to the ER for weakness, lightheadedness, soaking more than a pad an hour, or other new concerning symptoms.  You may be better to be seen at [facility] if you are having worsening vaginal bleeding.   Menorrhagia Menorrhagia is the medical term for when your menstrual periods are heavy or last longer than usual. With menorrhagia, every period you have may cause enough blood loss and cramping that you are unable to maintain your usual activities. CAUSES  In some cases, the cause of heavy periods is unknown, but a number of conditions may cause menorrhagia. Common causes include:  A problem with the hormone-producing thyroid gland (hypothyroid).  Noncancerous growths in the uterus (polyps or fibroids).  An imbalance of the estrogen and progesterone hormones.  One of your ovaries not releasing an egg during one or more months.  Side effects of having an intrauterine device (IUD).  Side effects of some medicines, such as anti-inflammatory medicines or blood thinners.  A bleeding disorder that stops your blood from clotting normally. SIGNS AND SYMPTOMS  During a normal period, bleeding lasts between 4 and 8 days. Signs that your periods are too heavy include:  You routinely have to change your pad or tampon every 1 or 2 hours because it is completely soaked.  You pass blood clots larger than 1 inch (2.5 cm) in size.  You have bleeding for more than 7 days.  You need to use pads and tampons at the same time because of heavy bleeding.  You need to wake up to change your pads or tampons during the night.  You have symptoms of anemia, such as tiredness, fatigue, or shortness of breath. DIAGNOSIS  Your health care provider will perform a physical exam and ask you questions about your symptoms and menstrual history. Other tests may be ordered based on what the health care provider finds during the exam. These tests can  include:  Blood tests To check if you are pregnant or have hormonal changes, a bleeding or thyroid disorder, low iron levels (anemia), or other problems.  Endometrial biopsy Your health care provider takes a sample of tissue from the inside of your uterus to be examined under a microscope.  Pelvic ultrasound This test uses sound waves to make a picture of your uterus, ovaries, and vagina. The pictures can show if you have fibroids or other growths.  Hysteroscopy For this test, your health care provider will use a small telescope to look inside your uterus. Based on the results of your initial tests, your health care provider may recommend further testing. TREATMENT  Treatment may not be needed. If it is needed, your health care provider may recommend treatment with one or more medicines first. If these do not reduce bleeding enough, a surgical treatment might be an option. The best treatment for you will depend on:   Whether you need to prevent pregnancy.  Your desire to have children in the future.  The cause and severity of your bleeding.  Your opinion and personal preference.  Medicines for menorrhagia may include:  Birth control methods that use hormones These include the pill, skin patch, vaginal ring, shots that you get every 3 months, hormonal IUD, and implant. These treatments reduce bleeding during your menstrual period.  Medicines that thicken blood and slow bleeding.  Medicines that reduce swelling, such as ibuprofen.  Medicines that contain a synthetic hormone called progestin.   Medicines that make the ovaries  stop working for a short time.  You may need surgical treatment for menorrhagia if the medicines are unsuccessful. Treatment options include:  Dilation and curettage (D&C) In this procedure, your health care provider opens (dilates) your cervix and then scrapes or suctions tissue from the lining of your uterus to reduce menstrual bleeding.  Operative  hysteroscopy This procedure uses a tiny tube with a light (hysteroscope) to view your uterine cavity and can help in the surgical removal of a polyp that may be causing heavy periods.  Endometrial ablation Through various techniques, your health care provider permanently destroys the entire lining of your uterus (endometrium). After endometrial ablation, most women have little or no menstrual flow. Endometrial ablation reduces your ability to become pregnant.  Endometrial resection This surgical procedure uses an electrosurgical wire loop to remove the lining of the uterus. This procedure also reduces your ability to become pregnant.  Hysterectomy Surgical removal of the uterus and cervix is a permanent procedure that stops menstrual periods. Pregnancy is not possible after a hysterectomy. This procedure requires anesthesia and hospitalization. HOME CARE INSTRUCTIONS   Only take over-the-counter or prescription medicines as directed by your health care provider. Take prescribed medicines exactly as directed. Do not change or switch medicines without consulting your health care provider.  Take any prescribed iron pills exactly as directed by your health care provider. Long-term heavy bleeding may result in low iron levels. Iron pills help replace the iron your body lost from heavy bleeding. Iron may cause constipation. If this becomes a problem, increase the bran, fruits, and roughage in your diet.  Do not take aspirin or medicines that contain aspirin 1 week before or during your menstrual period. Aspirin may make the bleeding worse.  If you need to change your sanitary pad or tampon more than once every 2 hours, stay in bed and rest as much as possible until the bleeding stops.  Eat well-balanced meals. Eat foods high in iron. Examples are leafy green vegetables, meat, liver, eggs, and whole grain breads and cereals. Do not try to lose weight until the abnormal bleeding has stopped and your blood  iron level is back to normal. SEEK MEDICAL CARE IF:   You soak through a pad or tampon every 1 or 2 hours, and this happens every time you have a period.  You need to use pads and tampons at the same time because you are bleeding so much.  You need to change your pad or tampon during the night.  You have a period that lasts for more than 8 days.  You pass clots bigger than 1 inch wide.  You have irregular periods that happen more or less often than once a month.  You feel dizzy or faint.  You feel very weak or tired.  You feel short of breath or feel your heart is beating too fast when you exercise.  You have nausea and vomiting or diarrhea while you are taking your medicine.  You have any problems that may be related to the medicine you are taking. SEEK IMMEDIATE MEDICAL CARE IF:   You soak through 4 or more pads or tampons in 2 hours.  You have any bleeding while you are pregnant. MAKE SURE YOU:   Understand these instructions.  Will watch your condition.  Will get help right away if you are not doing well or get worse. Document Released: 08/31/2005 Document Revised: 06/21/2013 Document Reviewed: 02/19/2013 [facility] Patient Information 2014 [place].

## 2014-01-27 NOTE — ED Notes (Signed)
MD at bedside. 

## 2014-01-27 NOTE — ED Provider Notes (Signed)
CSN: [id number]     Arrival date & time 01/26/14  2330 History   First MD Initiated Contact with Patient 01/27/14 0059     Chief Complaint  Patient presents with  . Menstrual Problem     (Consider location/radiation/quality/duration/timing/severity/associated sxs/prior Treatment) HPI 43 yo female presents to the ER from home with complaint of heavy menstrual bleeding.  Today is day 3 of period.  No pain, weakness, dizziness.  Pt has not been soaking pads.  She has passed some clots today which is unusual for her.  Pt was recently started on xarelto for dvt. Past Medical History  Diagnosis Date  . Asthma   . Hypertension   . Anxiety   . Snoring disorder     Pt stated my boyfriend always wakes me up and tells me to breathe  . Heart murmur   . CHF (congestive heart failure)   . Shortness of breath   . Bronchitis     hx of  . Bipolar affective disorder     Schizophrenia  . Depression   . GERD (gastroesophageal reflux disease)   . Arthritis   . Overactive bladder   . Schizophrenia   . Sciatica   . Anginal pain     occ from asthma   Past Surgical History  Procedure Laterality Date  . Rotator cuff repair      Right shoulder  . Tubal ligation    . Gallstones reomved    . Lumbar laminectomy/decompression microdiscectomy  04/01/2012    Procedure: LUMBAR LAMINECTOMY/DECOMPRESSION MICRODISCECTOMY 2 LEVELS;  Surgeon: Faythe Ghee, MD;  Location: [facility];  Service: Neurosurgery;  Laterality: Left;  Lumbar four-five, lumbar five sacral one microdiscectomy   . Back surgery    . Lumbar wound debridement  04/29/2012    Procedure: LUMBAR WOUND DEBRIDEMENT;  Surgeon: Faythe Ghee, MD;  Location: [facility];  Service: Neurosurgery;  Laterality: N/A;  lumbar wound debridement  . Cholecystectomy    . Eye surgery      Metal plate in right eye   No family history on file. History  Substance Use Topics  . Smoking status: Current Every Day Smoker -- 0.25 packs/day for 9 years    Types: Cigarettes  . Smokeless tobacco: Never Used  . Alcohol Use: Yes     Comment: One 40 oz. beer/day average--some tequilla   OB History   Grav Para Term Preterm Abortions TAB SAB Ect Mult Living                 Review of Systems  All other systems reviewed and are negative.     Allergies  Aspirin  Home Medications   Prior to Admission medications   Medication Sig Start Date End Date Taking? Authorizing Provider  albuterol (PROVENTIL HFA;VENTOLIN HFA) 108 (90 BASE) MCG/ACT inhaler Inhale 2 puffs into the lungs every 6 (six) hours as needed for wheezing or shortness of breath.    Historical Provider, MD  albuterol (PROVENTIL) (5 MG/ML) 0.5% nebulizer solution Take 0.5 mLs (2.5 mg total) by nebulization every 4 (four) hours as needed for wheezing or shortness of breath. 08/24/13   Hannah Muthersbaugh, PA-C  amLODipine (NORVASC) 10 MG tablet Take 1 tablet (10 mg total) by mouth daily. 06/07/13   Robbie Lis, MD  ARIPiprazole (ABILIFY) 20 MG tablet Take 20 mg by mouth at bedtime.    Historical Provider, MD  esomeprazole (NEXIUM) 40 MG capsule Take 40 mg by mouth daily at 12 noon.  Historical Provider, MD  furosemide (LASIX) 40 MG tablet Take 1 tablet (40 mg total) by mouth daily. 06/07/13   Robbie Lis, MD  gabapentin (NEURONTIN) 300 MG capsule Take 1 capsule (300 mg total) by mouth 2 (two) times daily. 06/07/13   Robbie Lis, MD  methocarbamol (ROBAXIN-750) 750 MG tablet Take 1 tablet (750 mg total) by mouth 4 (four) times daily. 11/01/13   Kalman Drape, MD  oxyCODONE-acetaminophen (PERCOCET) 10-325 MG per tablet Take 1-2 tablets by mouth every 4 (four) hours as needed for pain. 01/02/14   Charlie Pitter, MD  oxyCODONE-acetaminophen (PERCOCET) 5-325 MG per tablet Take 1 tablet by mouth every 6 (six) hours as needed. 01/20/14   Neta Ehlers, MD  predniSONE (DELTASONE) 10 MG tablet Take 2 tablets (20 mg total) by mouth daily. 11/12/13   Linus Mako, PA-C  solifenacin (VESICARE)  5 MG tablet Take 5 mg by mouth daily.    Historical Provider, MD  triamcinolone cream (KENALOG) 0.1 % Apply 1 application topically 2 (two) times daily as needed (eczema).    Historical Provider, MD  XARELTO STARTER PACK 15 & 20 MG TBPK Take 15-20 mg by mouth as directed. Take as directed on package: Start with one 15mg  tablet by mouth twice a day with food. On Day 22, switch to one 20mg  tablet once a day with food. 01/20/14   Neta Ehlers, MD   BP 132/69  Pulse 95  Temp(Src) 98.1 F (36.7 C) (Oral)  Resp 18  Ht 5\' 2"  (1.575 m)  Wt 217 lb (98.431 kg)  BMI 39.68 kg/m2  SpO2 98%  LMP 01/25/2014 Physical Exam  Nursing note and vitals reviewed. Constitutional: She is oriented to person, place, and time. She appears well-developed and well-nourished.  HENT:  Head: Normocephalic and atraumatic.  Nose: Nose normal.  Mouth/Throat: Oropharynx is clear and moist.  Eyes: Conjunctivae and EOM are normal. Pupils are equal, round, and reactive to light.  Neck: Normal range of motion. Neck supple. No JVD present. No tracheal deviation present. No thyromegaly present.  Cardiovascular: Normal rate, regular rhythm, normal heart sounds and intact distal pulses.  Exam reveals no gallop and no friction rub.   No murmur heard. Pulmonary/Chest: Effort normal and breath sounds normal. No stridor. No respiratory distress. She has no wheezes. She has no rales. She exhibits no tenderness.  Abdominal: Soft. Bowel sounds are normal. She exhibits no distension and no mass. There is no tenderness. There is no rebound and no guarding.  Genitourinary:  External genitalia normal Vagina with scant amount dark blood Cervix closed no lesions No cervical motion tenderness Adnexa palpated, no masses or tenderness noted Bladder palpated no tenderness Uterus palpated no masses or tenderness    Musculoskeletal: Normal range of motion. She exhibits no edema and no tenderness.  Lymphadenopathy:    She has no cervical  adenopathy.  Neurological: She is alert and oriented to person, place, and time. She exhibits normal muscle tone. Coordination normal.  Skin: Skin is dry. No rash noted. No erythema. No pallor.  Psychiatric: She has a normal mood and affect. Her behavior is normal. Judgment and thought content normal.    ED Course  Procedures (including critical care time) Labs Review Labs Reviewed  CBC WITH DIFFERENTIAL - Abnormal; Notable for the following:    Hemoglobin 11.7 (*)    HCT 35.1 (*)    All other components within normal limits  COMPREHENSIVE METABOLIC PANEL - Abnormal; Notable for the following:  Glucose, Bld 102 (*)    Albumin 3.3 (*)    Total Bilirubin 0.2 (*)    GFR calc non Af Amer 82 (*)    All other components within normal limits  SAMPLE TO BLOOD BANK    Imaging Review No results found.   EKG Interpretation None      MDM   Final diagnoses:  Menorrhagia  Anticoagulant long-term use    43 year old female with heavy menstrual bleeding.  Patient was recently started on xarelto for a DVT.  Patient does not have any signs of acute blood loss, no dizziness no weakness no shortness of breath.  H&H has one point difference from last week's draw.  Vaginal exam with scant amount of dark blood.  Patient reassured    Kalman Drape, MD 01/28/14 1012

## 2014-01-30 ENCOUNTER — Telehealth: Payer: Self-pay | Admitting: Internal Medicine

## 2014-01-30 NOTE — Telephone Encounter (Addendum)
Pt is out of bp meds and says bp has been really high.  Pt recently had back surgery and needs post-op PCP visit. Also pt is experiencing symptoms similar to gout on big toe.  Pt is scheduled for first available appt is on 02/12/14 but says needs earlier appt especially for med refill. Please f/u with pt.

## 2014-02-12 ENCOUNTER — Ambulatory Visit: Payer: Medicaid Other | Attending: Internal Medicine | Admitting: Internal Medicine

## 2014-02-12 ENCOUNTER — Encounter: Payer: Self-pay | Admitting: Internal Medicine

## 2014-02-12 VITALS — BP 150/96 | HR 95 | Temp 98.5°F | Resp 16 | Ht 62.0 in | Wt 221.0 lb

## 2014-02-12 DIAGNOSIS — I10 Essential (primary) hypertension: Secondary | ICD-10-CM | POA: Diagnosis not present

## 2014-02-12 DIAGNOSIS — Z79899 Other long term (current) drug therapy: Secondary | ICD-10-CM | POA: Diagnosis not present

## 2014-02-12 DIAGNOSIS — Z7901 Long term (current) use of anticoagulants: Secondary | ICD-10-CM | POA: Insufficient documentation

## 2014-02-12 DIAGNOSIS — I82409 Acute embolism and thrombosis of unspecified deep veins of unspecified lower extremity: Secondary | ICD-10-CM | POA: Diagnosis not present

## 2014-02-12 DIAGNOSIS — K219 Gastro-esophageal reflux disease without esophagitis: Secondary | ICD-10-CM | POA: Diagnosis not present

## 2014-02-12 DIAGNOSIS — I509 Heart failure, unspecified: Secondary | ICD-10-CM

## 2014-02-12 DIAGNOSIS — J45909 Unspecified asthma, uncomplicated: Secondary | ICD-10-CM

## 2014-02-12 DIAGNOSIS — F17200 Nicotine dependence, unspecified, uncomplicated: Secondary | ICD-10-CM | POA: Diagnosis not present

## 2014-02-12 DIAGNOSIS — G4730 Sleep apnea, unspecified: Secondary | ICD-10-CM

## 2014-02-12 DIAGNOSIS — F319 Bipolar disorder, unspecified: Secondary | ICD-10-CM | POA: Diagnosis not present

## 2014-02-12 DIAGNOSIS — E669 Obesity, unspecified: Secondary | ICD-10-CM

## 2014-02-12 DIAGNOSIS — M549 Dorsalgia, unspecified: Secondary | ICD-10-CM

## 2014-02-12 LAB — COMPLETE METABOLIC PANEL WITH GFR
ALT: 12 U/L (ref 0–35)
AST: 7 U/L (ref 0–37)
Albumin: 3.8 g/dL (ref 3.5–5.2)
Alkaline Phosphatase: 82 U/L (ref 39–117)
BUN: 17 mg/dL (ref 6–23)
CO2: 30 mEq/L (ref 19–32)
Calcium: 9.7 mg/dL (ref 8.4–10.5)
Chloride: 102 mEq/L (ref 96–112)
Creat: 0.96 mg/dL (ref 0.50–1.10)
GFR, Est African American: 84 mL/min
GFR, Est Non African American: 73 mL/min
Glucose, Bld: 99 mg/dL (ref 70–99)
Potassium: 4.6 mEq/L (ref 3.5–5.3)
Sodium: 139 mEq/L (ref 135–145)
Total Bilirubin: 0.2 mg/dL (ref 0.2–1.2)
Total Protein: 6.8 g/dL (ref 6.0–8.3)

## 2014-02-12 MED ORDER — RIVAROXABAN 20 MG PO TABS: 20.0000 mg | ORAL_TABLET | Freq: Every day | ORAL | Status: DC

## 2014-02-12 MED ORDER — ALBUTEROL SULFATE HFA 108 (90 BASE) MCG/ACT IN AERS: 2.0000 | INHALATION_SPRAY | Freq: Four times a day (QID) | RESPIRATORY_TRACT | Status: DC | PRN

## 2014-02-12 MED ORDER — FUROSEMIDE 40 MG PO TABS: 40.0000 mg | ORAL_TABLET | Freq: Every day | ORAL | Status: DC

## 2014-02-12 MED ORDER — GABAPENTIN 300 MG PO CAPS: 300.0000 mg | ORAL_CAPSULE | Freq: Two times a day (BID) | ORAL | Status: DC

## 2014-02-12 MED ORDER — TRAMADOL HCL 50 MG PO TABS: 50.0000 mg | ORAL_TABLET | Freq: Two times a day (BID) | ORAL | Status: DC | PRN

## 2014-02-12 MED ORDER — OMEPRAZOLE 20 MG PO CPDR: 20.0000 mg | DELAYED_RELEASE_CAPSULE | Freq: Every day | ORAL | Status: DC

## 2014-02-12 MED ORDER — AMLODIPINE BESYLATE 10 MG PO TABS: 10.0000 mg | ORAL_TABLET | Freq: Every day | ORAL | Status: DC

## 2014-02-12 NOTE — Progress Notes (Signed)
Post op appointment. Pt had SX on her lower back. Pt has a history of blood clots.  Pt is requesting medication to help calm her nerves.

## 2014-02-12 NOTE — Patient Instructions (Signed)
Smoking Cessation Quitting smoking is important to your health and has many advantages. However, it is not always easy to quit since nicotine is a very addictive drug. Often times, people try 3 times or more before being able to quit. This document explains the best ways for you to prepare to quit smoking. Quitting takes hard work and a lot of effort, but you can do it. ADVANTAGES OF QUITTING SMOKING  You will live longer, feel better, and live better.  Your body will feel the impact of quitting smoking almost immediately.  Within 20 minutes, blood pressure decreases. Your pulse returns to its normal level.  After 8 hours, carbon monoxide levels in the blood return to normal. Your oxygen level increases.  After 24 hours, the chance of having a heart attack starts to decrease. Your breath, hair, and body stop smelling like smoke.  After 48 hours, damaged nerve endings begin to recover. Your sense of taste and smell improve.  After 72 hours, the body is virtually free of nicotine. Your bronchial tubes relax and breathing becomes easier.  After 2 to 12 weeks, lungs can hold more air. Exercise becomes easier and circulation improves.  The risk of having a heart attack, stroke, cancer, or lung disease is greatly reduced.  After 1 year, the risk of coronary heart disease is cut in half.  After 5 years, the risk of stroke falls to the same as a nonsmoker.  After 10 years, the risk of lung cancer is cut in half and the risk of other cancers decreases significantly.  After 15 years, the risk of coronary heart disease drops, usually to the level of a nonsmoker.  If you are pregnant, quitting smoking will improve your chances of having a healthy baby.  The people you live with, especially any children, will be healthier.  You will have extra money to spend on things other than cigarettes. QUESTIONS TO THINK ABOUT BEFORE ATTEMPTING TO QUIT You may want to talk about your answers with your  caregiver.  Why do you want to quit?  If you tried to quit in the past, what helped and what did not?  What will be the most difficult situations for you after you quit? How will you plan to handle them?  Who can help you through the tough times? Your family? Friends? A caregiver?  What pleasures do you get from smoking? What ways can you still get pleasure if you quit? Here are some questions to ask your caregiver:  How can you help me to be successful at quitting?  What medicine do you think would be best for me and how should I take it?  What should I do if I need more help?  What is smoking withdrawal like? How can I get information on withdrawal? GET READY  Set a quit date.  Change your environment by getting rid of all cigarettes, ashtrays, matches, and lighters in your home, car, or work. Do not let people smoke in your home.  Review your past attempts to quit. Think about what worked and what did not. GET SUPPORT AND ENCOURAGEMENT You have a better chance of being successful if you have help. You can get support in many ways.  Tell your family, friends, and co-workers that you are going to quit and need their support. Ask them not to smoke around you.  Get individual, group, or telephone counseling and support. Programs are available at local hospitals and health centers. Call your local health department for   information about programs in your area.  Spiritual beliefs and practices may help some smokers quit.  Download a "quit meter" on your computer to keep track of quit statistics, such as how long you have gone without smoking, cigarettes not smoked, and money saved.  Get a self-help book about quitting smoking and staying off of tobacco. [place] yourself from urges to smoke. Talk to someone, go for a walk, or occupy your time with a task.  Change your normal routine. Take a different route to work. Drink tea instead of coffee.  Eat breakfast in a different place.  Reduce your stress. Take a hot bath, exercise, or read a book.  Plan something enjoyable to do every day. Reward yourself for not smoking.  Explore interactive web-based programs that specialize in helping you quit. GET MEDICINE AND USE IT CORRECTLY Medicines can help you stop smoking and decrease the urge to smoke. Combining medicine with the above behavioral methods and support can greatly increase your chances of successfully quitting smoking.  Nicotine replacement therapy helps deliver nicotine to your body without the negative effects and risks of smoking. Nicotine replacement therapy includes nicotine gum, lozenges, inhalers, nasal sprays, and skin patches. Some may be available over-the-counter and others require a prescription.  Antidepressant medicine helps people abstain from smoking, but how this works is unknown. This medicine is available by prescription.  Nicotinic receptor partial agonist medicine simulates the effect of nicotine in your brain. This medicine is available by prescription. Ask your caregiver for advice about which medicines to use and how to use them based on your health history. Your caregiver will tell you what side effects to look out for if you choose to be on a medicine or therapy. Carefully read the information on the package. Do not use any other product containing nicotine while using a nicotine replacement product.  RELAPSE OR DIFFICULT SITUATIONS Most relapses occur within the first 3 months after quitting. Do not be discouraged if you start smoking again. Remember, most people try several times before finally quitting. You may have symptoms of withdrawal because your body is used to nicotine. You may crave cigarettes, be irritable, feel very hungry, cough often, get headaches, or have difficulty concentrating. The withdrawal symptoms are only temporary. They are strongest when you first quit, but they will go away within  10 14 days. To reduce the chances of relapse, try to:  Avoid drinking alcohol. Drinking lowers your chances of successfully quitting.  Reduce the amount of caffeine you consume. Once you quit smoking, the amount of caffeine in your body increases and can give you symptoms, such as a rapid heartbeat, sweating, and anxiety.  Avoid smokers because they can make you want to smoke.  Do not let weight gain distract you. Many smokers will gain weight when they quit, usually less than 10 pounds. Eat a healthy diet and stay active. You can always lose the weight gained after you quit.  Find ways to improve your mood other than smoking. FOR MORE INFORMATION  www.smokefree.gov  Document Released: 08/25/2001 Document Revised: 03/01/2012 Document Reviewed: 12/10/2011 [facility] Patient Information 2014 [place], [place]. Deep Vein Thrombosis A deep vein thrombosis (DVT) is a blood clot that develops in the deep, larger veins of the leg, arm, or pelvis. These are more dangerous than clots that might form in veins near the surface of the body. A DVT can lead to complications if the clot breaks off and travels in the bloodstream  to the lungs.  A DVT can damage the valves in your leg veins, so that instead of flowing upward, the blood pools in the lower leg. This is called post-thrombotic syndrome, and it can result in pain, swelling, discoloration, and sores on the leg. CAUSES Usually, several things contribute to blood clots forming. Contributing factors include:  The flow of blood slows down.  The inside of the vein is damaged in some way.  You have a condition that makes blood clot more easily. RISK FACTORS Some people are more likely than others to develop blood clots. Risk factors include:   Older age, especially over 31 years of age.  Having a family history of blood clots or if you have already had a blot clot.  Having major or lengthy surgery. This is especially true for surgery on the hip,  knee, or belly (abdomen). Hip surgery is particularly high risk.  Breaking a hip or leg.  Sitting or lying still for a long time. This includes long-distance travel, paralysis, or recovery from an illness or surgery.  Having cancer or cancer treatment.  Having a long, thin tube (catheter) placed inside a vein during a medical procedure.  Being overweight (obese).  Pregnancy and childbirth.  Hormone changes make the blood clot more easily during pregnancy.  The fetus puts pressure on the veins of the pelvis.  There is a risk of injury to veins during delivery or a caesarean. The risk is highest just after childbirth.  Medicines with the female hormone estrogen. This includes birth control pills and hormone replacement therapy.  Smoking.  Other circulation or heart problems.  SIGNS AND SYMPTOMS When a clot forms, it can either partially or totally block the blood flow in that vein. Symptoms of a DVT can include:  Swelling of the leg or arm, especially if one side is much worse.  Warmth and redness of the leg or arm, especially if one side is much worse.  Pain in an arm or leg. If the clot is in the leg, symptoms may be more noticeable or worse when standing or walking. The symptoms of a DVT that has traveled to the lungs (pulmonary embolism, PE) usually start suddenly and include:  Shortness of breath.  Coughing.  Coughing up blood or blood-tinged phlegm.  Chest pain. The chest pain is often worse with deep breaths.  Rapid heartbeat. Anyone with these symptoms should get emergency medical treatment right away. Call your local emergency services (911 in the [place] if you have these symptoms. DIAGNOSIS If a DVT is suspected, your health care provider will take a full medical history and perform a physical exam. Tests that also may be required include:  Blood tests, including studies of the clotting properties of the blood.  Ultrasonography to see if you have clots in  your legs or lungs.  X-rays to show the flow of blood when dye is injected into the veins (venography).  Studies of your lungs if you have any chest symptoms. PREVENTION  Exercise the legs regularly. Take a brisk 30-minute walk every day.  Maintain a weight that is appropriate for your height.  Avoid sitting or lying in bed for long periods of time without moving your legs.  Women, particularly those over the age of 38 years, should consider the risks and benefits of taking estrogen medicines, including birth control pills.  Do not smoke, especially if you take estrogen medicines.  Long-distance travel can increase your risk of DVT. You should exercise your legs  by walking or pumping the muscles every hour.  In-hospital prevention:  Many of the risk factors above relate to situations that exist with hospitalization, either for illness, injury, or elective surgery.  Your health care provider will assess you for the need for venous thromboembolism prophylaxis when you are admitted to the hospital. If you are having surgery, your surgeon will assess you the day of or day after surgery.  Prevention may include medical and nonmedical measures. TREATMENT Once identified, a DVT can be treated. It can also be prevented in some circumstances. Once you have had a DVT, you may be at increased risk for a DVT in the future. The most common treatment for DVT is blood thinning (anticoagulant) medicine, which reduces the blood's tendency to clot. Anticoagulants can stop new blood clots from forming and stop old ones from growing. They cannot dissolve existing clots. Your body does this by itself over time. Anticoagulants can be given by mouth, by IV access, or by injection. Your health care provider will determine the best program for you. Other medicines or treatments that may be used are:  Heparin or related medicines (low molecular weight heparin) are usually the first treatment for a blood clot.  They act quickly. However, they cannot be taken orally.  Heparin can cause a fall in a component of blood that stops bleeding and forms blood clots (platelets). You will be monitored with blood tests to be sure this does not occur.  Warfarin is an anticoagulant that can be swallowed. It takes a few days to start working, so usually heparin or related medicines are used in combination. Once warfarin is working, heparin is usually stopped.  Less commonly, clot dissolving drugs (thrombolytics) are used to dissolve a DVT. They carry a high risk of bleeding, so they are used mainly in severe cases, where your life or a limb is threatened.  Very rarely, a blood clot in the leg needs to be removed surgically.  If you are unable to take anticoagulants, your health care provider may arrange for you to have a filter placed in a main vein in your abdomen. This filter prevents clots from traveling to your lungs. HOME CARE INSTRUCTIONS  Take all medicines prescribed by your health care provider. Only take over-the-counter or prescription medicines for pain, fever, or discomfort as directed by your health care provider.  Warfarin. Most people will continue taking warfarin after hospital discharge. Your health care provider will advise you on the length of treatment (usually 3 6 months, sometimes lifelong).  Too much and too little warfarin are both dangerous. Too much warfarin increases the risk of bleeding. Too little warfarin continues to allow the risk for blood clots. While taking warfarin, you will need to have regular blood tests to measure your blood clotting time. These blood tests usually include both the prothrombin time (PT) and international normalized ratio (INR) tests. The PT and INR results allow your health care provider to adjust your dose of warfarin. The dose can change for many reasons. It is critically important that you take warfarin exactly as prescribed, and that you have your PT and INR  levels drawn exactly as directed.  Many foods, especially foods high in vitamin K, can interfere with warfarin and affect the PT and INR results. Foods high in vitamin K include spinach, kale, broccoli, cabbage, collard and turnip greens, brussel sprouts, peas, cauliflower, seaweed, and parsley as well as beef and pork liver, green tea, and soybean oil. You should eat a  consistent amount of foods high in vitamin K. Avoid major changes in your diet, or notify your health care provider before changing your diet. Arrange a visit with a dietitian to answer your questions.  Many medicines can interfere with warfarin and affect the PT and INR results. You must tell your health care provider about any and all medicines you take. This includes all vitamins and supplements. Be especially cautious with aspirin and anti-inflammatory medicines. Ask your health care provider before taking these. Do not take or discontinue any prescribed or over-the-counter medicine except on the advice of your health care provider or pharmacist.  Warfarin can have side effects, primarily excessive bruising or bleeding. You will need to hold pressure over cuts for longer than usual. Your health care provider or pharmacist will discuss other potential side effects.  Alcohol can change the body's ability to handle warfarin. It is best to avoid alcoholic drinks or consume only very small amounts while taking warfarin. Notify your health care provider if you change your alcohol intake.  Notify your dentist or other health care providers before procedures.  Activity. Ask your health care provider how soon you can go back to normal activities. It is important to stay active to prevent blood clots. If you are on anticoagulant medicine, avoid contact sports.  Exercise. It is very important to exercise. This is especially important while traveling, sitting, or standing for long periods of time. Exercise your legs by walking or by pumping the  muscles frequently. Take frequent walks.  Compression stockings. These are tight elastic stockings that apply pressure to the lower legs. This pressure can help keep the blood in the legs from clotting. You may need to wear compression stockings at home to help prevent a DVT.  Do not smoke. If you smoke, quit. Ask your health care provider for help with quitting smoking.  Learn as much as you can about DVT. Knowing more about the condition should help you keep it from coming back.  Wear a medical alert bracelet or carry a medical alert card. SEEK MEDICAL CARE IF:  You notice a rapid heartbeat.  You feel weaker or more tired than usual.  You feel faint.  You notice increased bruising.  You feel your symptoms are not getting better in the time expected.  You believe you are having side effects of medicine. SEEK IMMEDIATE MEDICAL CARE IF:  You have chest pain.  You have trouble breathing.  You have new or increased swelling or pain in one leg.  You cough up blood.  You notice blood in vomit, in a bowel movement, or in urine. MAKE SURE YOU:  Understand these instructions.  Will watch your condition.  Will get help right away if you are not doing well or get worse. Document Released: 08/31/2005 Document Revised: 06/21/2013 Document Reviewed: 05/08/2013 [facility] Patient Information 2014 [place].

## 2014-02-12 NOTE — Progress Notes (Signed)
Patient ID: Lori Bean, female   DOB: 08-10-1971, 43 y.o.   MRN: [id number]  CC: post-op f/u   HPI:  Patient had spinal fusion surgery on April 19th. Patient found to have blood clot 2 weeks later in left leg.  Patient has been taking Xarelto since then.  Smokes one pack per day. Patient reports heavy bleeding with menstrual cycle. Paresthia feeling in all toes and especially first two toes on bilateral feet since surgery.  Has reports only one hour of sleep per night.  Was placed on Ambien 5 mg nightly by Neurologist to help with insomnia.  Has appointment with wound center for improperly healing wound on lower back on June 9th.  Patient has family history of diabetes.  Pain in lower back that radiates down left leg to toes. Bilateral lower extremity swelling since surgery.  Patient request a sleep study due to loud snoring, witnessed apneic events, headaches upon wakening, and daytime sleepiness.    Allergies  Allergen Reactions  . Aspirin Nausea And Vomiting    'It makes me throw up."   Past Medical History  Diagnosis Date  . Asthma   . Hypertension   . Anxiety   . Snoring disorder     Pt stated my boyfriend always wakes me up and tells me to breathe  . Heart murmur   . CHF (congestive heart failure)   . Shortness of breath   . Bronchitis     hx of  . Bipolar affective disorder     Schizophrenia  . Depression   . GERD (gastroesophageal reflux disease)   . Arthritis   . Overactive bladder   . Schizophrenia   . Sciatica   . Anginal pain     occ from asthma   Current Outpatient Prescriptions on File Prior to Visit  Medication Sig Dispense Refill  . albuterol (PROVENTIL HFA;VENTOLIN HFA) 108 (90 BASE) MCG/ACT inhaler Inhale 2 puffs into the lungs every 6 (six) hours as needed for wheezing or shortness of breath.      [name] albuterol (PROVENTIL) (5 MG/ML) 0.5% nebulizer solution Take 0.5 mLs (2.5 mg total) by nebulization every 4 (four) hours as needed for wheezing or  shortness of breath.  20 mL  12  . amLODipine (NORVASC) 10 MG tablet Take 1 tablet (10 mg total) by mouth daily.  30 tablet  3  . ARIPiprazole (ABILIFY) 20 MG tablet Take 20 mg by mouth at bedtime.      [name] esomeprazole (NEXIUM) 40 MG capsule Take 40 mg by mouth daily at 12 noon.      . furosemide (LASIX) 40 MG tablet Take 1 tablet (40 mg total) by mouth daily.  30 tablet  3  . gabapentin (NEURONTIN) 300 MG capsule Take 1 capsule (300 mg total) by mouth 2 (two) times daily.  60 capsule  3  . methocarbamol (ROBAXIN-750) 750 MG tablet Take 1 tablet (750 mg total) by mouth 4 (four) times daily.  40 tablet  0  . oxyCODONE-acetaminophen (PERCOCET) 10-325 MG per tablet Take 1-2 tablets by mouth every 4 (four) hours as needed for pain.  90 tablet  0  . predniSONE (DELTASONE) 10 MG tablet Take 2 tablets (20 mg total) by mouth daily.  21 tablet  0  . solifenacin (VESICARE) 5 MG tablet Take 5 mg by mouth daily.      [name] triamcinolone cream (KENALOG) 0.1 % Apply 1 application topically 2 (two) times daily as needed (eczema).      [name]  XARELTO STARTER PACK 15 & 20 MG TBPK Take 15-20 mg by mouth as directed. Take as directed on package: Start with one 15mg  tablet by mouth twice a day with food. On Day 22, switch to one 20mg  tablet once a day with food.  51 each  0  . oxyCODONE-acetaminophen (PERCOCET) 5-325 MG per tablet Take 1 tablet by mouth every 6 (six) hours as needed.  15 tablet  0  . [DISCONTINUED] solifenacin (VESICARE) 5 MG tablet Take 5 mg by mouth daily.       No current facility-administered medications on file prior to visit.   Family History  Problem Relation Age of Onset  . Diabetes Mother   . Diabetes Father   . Heart disease Paternal Aunt   . Cancer Paternal Aunt    History   Social History  . Marital Status: Divorced    Spouse Name: N/A    Number of Children: N/A  . Years of Education: N/A   Occupational History  . Not on file.   Social History Main Topics  . Smoking status: Current  Every Day Smoker -- 0.25 packs/day for 9 years    Types: Cigarettes  . Smokeless tobacco: Never Used  . Alcohol Use: Yes     Comment: One 40 oz. beer/day average--some tequilla  . Drug Use: No  . Sexual Activity: Not Currently   Other Topics Concern  . Not on file   Social History Narrative  . No narrative on file    Review of Systems: See HPI   Objective:   Filed Vitals:   02/12/14 1428  BP: 150/96  Pulse: 95  Temp: 98.5 F (36.9 C)  Resp: 16   Physical Exam  Vitals reviewed. Constitutional: She is oriented to person, place, and time.  HENT:  Head: Normocephalic.  Right Ear: External ear normal.  Left Ear: External ear normal.  Mouth/Throat: Oropharynx is clear and moist.  Eyes: Conjunctivae and EOM are normal. Pupils are equal, round, and reactive to light.  Neck: Normal range of motion. Neck supple. No JVD present.  Cardiovascular: Normal rate, regular rhythm, normal heart sounds and intact distal pulses.   Pulmonary/Chest: Effort normal and breath sounds normal.  Abdominal: Soft. Bowel sounds are normal.  Musculoskeletal: Normal range of motion.  Lymphadenopathy:    She has no cervical adenopathy.  Neurological: She is alert and oriented to person, place, and time. She has normal reflexes. No cranial nerve deficit.  Skin: Skin is warm and dry.  Psychiatric: She has a normal mood and affect.     Lab Results  Component Value Date   WBC 7.6 01/26/2014   HGB 11.7* 01/26/2014   HCT 35.1* 01/26/2014   MCV 90.2 01/26/2014   PLT 294 01/26/2014   Lab Results  Component Value Date   CREATININE 0.86 01/26/2014   BUN 10 01/26/2014   NA 139 01/26/2014   K 4.0 01/26/2014   CL 103 01/26/2014   CO2 25 01/26/2014    No results found for this basename: HGBA1C   Lipid Panel     Component Value Date/Time   CHOL  Value: 152        ATP III CLASSIFICATION:  <200     mg/dL   Desirable  200-239  mg/dL   Borderline High  >=240    mg/dL   High        07/31/2009 0740   TRIG 51  07/31/2009 0740   HDL 62 07/31/2009 0740   CHOLHDL 2.5  07/31/2009 0740   VLDL 10 07/31/2009 0740   LDLCALC  Value: 80        Total Cholesterol/HDL:CHD Risk Coronary Heart Disease Risk Table                     Men   Women  1/2 Average Risk   3.4   3.3  Average Risk       5.0   4.4  2 X Average Risk   9.6   7.1  3 X Average Risk  23.4   11.0        Use the calculated Patient Ratio above and the CHD Risk Table to determine the patient's CHD Risk.        ATP III CLASSIFICATION (LDL):  <100     mg/dL   Optimal  100-129  mg/dL   Near or Above                    Optimal  130-159  mg/dL   Borderline  160-189  mg/dL   High  >190     mg/dL   Very High 07/31/2009 0740       Assessment and plan:   Lori Bean was seen today for follow-up.  Diagnoses and associated orders for this visit:  CHF (congestive heart failure) Patient reports a history of CHF. I am unable to find in medical records. Reports that she takes daily lasix. Patient will do daily weights and RTC if she has >5lb weight gain in one 1 week. Will have patient to f/u with Dr. Verl Blalock next week, reports she has never had a cardiologist. - furosemide (LASIX) 40 MG tablet; Take 1 tablet (40 mg total) by mouth daily. Patient will need potassium.  DVT (deep venous thrombosis) Stressed importance of smoking cessation in order to avoid further complications - rivaroxaban (XARELTO) 20 MG TABS tablet; Take 1 tablet (20 mg total) by mouth daily with supper.  Unspecified sleep apnea - Ambulatory referral to Sleep Studies  HTN (hypertension) Continue current regimen. Will have BP checked next week at visit with Dr. Verl Blalock.   - amLODipine (NORVASC) 10 MG tablet; Take 1 tablet (10 mg total) by mouth daily. - COMPLETE METABOLIC PANEL WITH GFR  GERD (gastroesophageal reflux disease) - omeprazole (PRILOSEC) 20 MG capsule; Take 1 capsule (20 mg total) by mouth daily.  Bipolar disorder, unspecified - Ambulatory referral to Psychiatry. Patient reports that  she has not had her medication in over one year.   Back pain - gabapentin (NEURONTIN) 300 MG capsule; Take 1 capsule (300 mg total) by mouth 2 (two) times daily. - traMADol (ULTRAM) 50 MG tablet; Take 1 tablet (50 mg total) by mouth 2 (two) times daily as needed.  Unspecified asthma(493.90) - albuterol (PROVENTIL HFA;VENTOLIN HFA) 108 (90 BASE) MCG/ACT inhaler; Inhale 2 puffs into the lungs every 6 (six) hours as needed for wheezing or shortness of breath.  Obesity, unspecified - Hemoglobin A1c; Future Patient should begin walking-exercise regimen at least three days a week. Nothing strenuous to affect DVT Tobacco use disorder Stressed smoke cessation and how it places her at increased risk for blood clots and cardiovascular disease.   Return for Dr. Verl Blalock for CHF 1 week, 1 month PCP.  Lance Bosch, [facility] [phone] 02/13/2014, 2:48 PM

## 2014-02-13 ENCOUNTER — Telehealth: Payer: Self-pay | Admitting: Internal Medicine

## 2014-02-13 ENCOUNTER — Telehealth: Payer: Self-pay | Admitting: Emergency Medicine

## 2014-02-13 NOTE — Telephone Encounter (Signed)
Spoke with pt in regards to URI sx that started yesterday post ov. Informed pt to start taking cough medication and decongestant otc, if no improvement or chest pain,sob develops return to clinic tomorrow during 9-11 am walk ins. Also instructed pt to go directly to Er if develop worsening chest pain,sob

## 2014-02-13 NOTE — Telephone Encounter (Signed)
Pt was seen yesterday for office visit, says she woke up with really bad cough w/ yellow and green mucus. Pt would like to speak to clinician about what to do. Please f/u with pt.

## 2014-02-20 ENCOUNTER — Encounter (HOSPITAL_BASED_OUTPATIENT_CLINIC_OR_DEPARTMENT_OTHER): Payer: Medicaid Other | Attending: General Surgery

## 2014-02-20 DIAGNOSIS — I509 Heart failure, unspecified: Secondary | ICD-10-CM | POA: Insufficient documentation

## 2014-02-20 DIAGNOSIS — Z981 Arthrodesis status: Secondary | ICD-10-CM | POA: Insufficient documentation

## 2014-02-20 DIAGNOSIS — Z79899 Other long term (current) drug therapy: Secondary | ICD-10-CM | POA: Insufficient documentation

## 2014-02-20 DIAGNOSIS — Z86718 Personal history of other venous thrombosis and embolism: Secondary | ICD-10-CM | POA: Insufficient documentation

## 2014-02-20 DIAGNOSIS — T8189XA Other complications of procedures, not elsewhere classified, initial encounter: Secondary | ICD-10-CM | POA: Insufficient documentation

## 2014-02-20 DIAGNOSIS — IMO0002 Reserved for concepts with insufficient information to code with codable children: Secondary | ICD-10-CM | POA: Insufficient documentation

## 2014-02-20 DIAGNOSIS — M069 Rheumatoid arthritis, unspecified: Secondary | ICD-10-CM | POA: Insufficient documentation

## 2014-02-20 DIAGNOSIS — I10 Essential (primary) hypertension: Secondary | ICD-10-CM | POA: Insufficient documentation

## 2014-02-20 DIAGNOSIS — Z7901 Long term (current) use of anticoagulants: Secondary | ICD-10-CM | POA: Insufficient documentation

## 2014-02-20 DIAGNOSIS — Y838 Other surgical procedures as the cause of abnormal reaction of the patient, or of later complication, without mention of misadventure at the time of the procedure: Secondary | ICD-10-CM | POA: Insufficient documentation

## 2014-02-21 ENCOUNTER — Encounter: Payer: Self-pay | Admitting: Cardiology

## 2014-02-21 ENCOUNTER — Other Ambulatory Visit: Payer: Self-pay | Admitting: *Deleted

## 2014-02-21 ENCOUNTER — Ambulatory Visit: Payer: Medicaid Other | Attending: Cardiology | Admitting: Cardiology

## 2014-02-21 VITALS — BP 138/95 | HR 98 | Temp 98.2°F | Resp 20 | Ht 62.0 in | Wt 215.0 lb

## 2014-02-21 DIAGNOSIS — R609 Edema, unspecified: Secondary | ICD-10-CM | POA: Insufficient documentation

## 2014-02-21 DIAGNOSIS — F319 Bipolar disorder, unspecified: Secondary | ICD-10-CM | POA: Insufficient documentation

## 2014-02-21 DIAGNOSIS — Z79899 Other long term (current) drug therapy: Secondary | ICD-10-CM | POA: Diagnosis not present

## 2014-02-21 DIAGNOSIS — E669 Obesity, unspecified: Secondary | ICD-10-CM | POA: Diagnosis not present

## 2014-02-21 DIAGNOSIS — Z86718 Personal history of other venous thrombosis and embolism: Secondary | ICD-10-CM | POA: Insufficient documentation

## 2014-02-21 DIAGNOSIS — N318 Other neuromuscular dysfunction of bladder: Secondary | ICD-10-CM | POA: Insufficient documentation

## 2014-02-21 DIAGNOSIS — F17200 Nicotine dependence, unspecified, uncomplicated: Secondary | ICD-10-CM | POA: Insufficient documentation

## 2014-02-21 DIAGNOSIS — I10 Essential (primary) hypertension: Secondary | ICD-10-CM

## 2014-02-21 DIAGNOSIS — I509 Heart failure, unspecified: Secondary | ICD-10-CM

## 2014-02-21 DIAGNOSIS — I517 Cardiomegaly: Secondary | ICD-10-CM

## 2014-02-21 DIAGNOSIS — K219 Gastro-esophageal reflux disease without esophagitis: Secondary | ICD-10-CM | POA: Diagnosis not present

## 2014-02-21 DIAGNOSIS — J45909 Unspecified asthma, uncomplicated: Secondary | ICD-10-CM | POA: Insufficient documentation

## 2014-02-21 DIAGNOSIS — Z7901 Long term (current) use of anticoagulants: Secondary | ICD-10-CM | POA: Insufficient documentation

## 2014-02-21 MED ORDER — FUROSEMIDE 80 MG PO TABS: 80.0000 mg | ORAL_TABLET | Freq: Every day | ORAL | Status: DC

## 2014-02-21 MED ORDER — TRIAMCINOLONE ACETONIDE 0.1 % EX CREA: 1.0000 "application " | TOPICAL_CREAM | Freq: Two times a day (BID) | CUTANEOUS | Status: DC | PRN

## 2014-02-21 MED ORDER — SOLIFENACIN SUCCINATE 5 MG PO TABS: 5.0000 mg | ORAL_TABLET | Freq: Every day | ORAL | Status: DC

## 2014-02-21 NOTE — Progress Notes (Signed)
Patient with recent spinal fusion on 12/28/13. Blood clot in left leg after surgery-taking Xarelto Patient indicates she was diagnosed with CHF 2-3 years ago. Not weighing herself daily.  Lower extremity swelling. Patient complains of shortness of breath, intermittent chest pain-none at this time, and headaches daily. Patient indicates she fell yesterday and landed on her left side. Patient indicates she did not hit her head.

## 2014-02-21 NOTE — Progress Notes (Signed)
HPI Lori Bean comes in today for evaluation of a history of lower extremity swelling and question of heart failure.  I reviewed her chart and most recent note. There is no history of congestive heart failure but she did have cardiomegaly on a chest CT in 2013. Her last chest x-ray showed borderline cardiomegaly.  She does have a history of lower extremity swelling. She feels her Lasix needs to be increased. Risk factors for heart failure include tobacco use a pack per day, hence COPD, hypertension, and obesity.  She is also a pounds since her last visit here. She is very motivated to improve her health. She's cut back to a couple cigarettes a day.  She denies orthopnea or PND. There may be a history of sleep apnea. She is set up to have an ambulatory sleep study.  Past Medical History  Diagnosis Date  . Asthma   . Hypertension   . Anxiety   . Snoring disorder     Pt stated my boyfriend always wakes me up and tells me to breathe  . Heart murmur   . CHF (congestive heart failure)   . Shortness of breath   . Bronchitis     hx of  . Bipolar affective disorder     Schizophrenia  . Depression   . GERD (gastroesophageal reflux disease)   . Arthritis   . Overactive bladder   . Schizophrenia   . Sciatica   . Anginal pain     occ from asthma    Current Outpatient Prescriptions  Medication Sig Dispense Refill  . albuterol (PROVENTIL HFA;VENTOLIN HFA) 108 (90 BASE) MCG/ACT inhaler Inhale 2 puffs into the lungs every 6 (six) hours as needed for wheezing or shortness of breath.  1 Inhaler  3  . amLODipine (NORVASC) 10 MG tablet Take 1 tablet (10 mg total) by mouth daily.  30 tablet  3  . ARIPiprazole (ABILIFY) 20 MG tablet Take 20 mg by mouth at bedtime.      [name] esomeprazole (NEXIUM) 40 MG capsule Take 40 mg by mouth daily at 12 noon.      . furosemide (LASIX) 80 MG tablet Take 1 tablet (80 mg total) by mouth daily.  30 tablet  3  . gabapentin (NEURONTIN) 300 MG capsule Take 1 capsule  (300 mg total) by mouth 2 (two) times daily.  60 capsule  3  . methocarbamol (ROBAXIN-750) 750 MG tablet Take 1 tablet (750 mg total) by mouth 4 (four) times daily.  40 tablet  0  . omeprazole (PRILOSEC) 20 MG capsule Take 1 capsule (20 mg total) by mouth daily.  30 capsule  3  . oxyCODONE-acetaminophen (PERCOCET) 10-325 MG per tablet Take 1-2 tablets by mouth every 4 (four) hours as needed for pain.  90 tablet  0  . oxyCODONE-acetaminophen (PERCOCET) 5-325 MG per tablet Take 1 tablet by mouth every 6 (six) hours as needed.  15 tablet  0  . predniSONE (DELTASONE) 10 MG tablet Take 2 tablets (20 mg total) by mouth daily.  21 tablet  0  . rivaroxaban (XARELTO) 20 MG TABS tablet Take 1 tablet (20 mg total) by mouth daily with supper.  30 tablet  3  . solifenacin (VESICARE) 5 MG tablet Take 5 mg by mouth daily.      . traMADol (ULTRAM) 50 MG tablet Take 1 tablet (50 mg total) by mouth 2 (two) times daily as needed.  60 tablet  0  . triamcinolone cream (KENALOG) 0.1 % Apply 1  application topically 2 (two) times daily as needed (eczema).      . [DISCONTINUED] solifenacin (VESICARE) 5 MG tablet Take 5 mg by mouth daily.       No current facility-administered medications for this visit.    Allergies  Allergen Reactions  . Aspirin Nausea And Vomiting    'It makes me throw up."    Family History  Problem Relation Age of Onset  . Diabetes Mother   . Diabetes Father   . Heart disease Paternal Aunt   . Cancer Paternal Aunt     History   Social History  . Marital Status: Divorced    Spouse Name: N/A    Number of Children: N/A  . Years of Education: N/A   Occupational History  . Not on file.   Social History Main Topics  . Smoking status: Current Every Day Smoker -- 0.25 packs/day for 9 years    Types: Cigarettes  . Smokeless tobacco: Never Used  . Alcohol Use: Yes     Comment: One 40 oz. beer/day average--some tequilla  . Drug Use: No  . Sexual Activity: Not Currently   Other  Topics Concern  . Not on file   Social History Narrative  . No narrative on file    ROS ALL NEGATIVE EXCEPT THOSE NOTED IN HPI  PE  General Appearance: well developed, well nourished in no acute distress, obese HEENT: symmetrical face, PERRLA, good dentition  Neck: no JVD, thyromegaly, or adenopathy, trachea midline Chest: symmetric without deformity Cardiac: PMI POORLY APPRECIATED RRR, normal S1, S2, no gallop or murmur Lung: clear to ausculation and percussion Vascular: all pulses full without bruits  Abdominal: nondistended, nontender, good bowel sounds, no HSM, no bruits Extremities: no cyanosis, clubbing , 1+ pitting edema lower tibia and 2+ at the ankles., no sign of DVT, no varicosities  Skin: normal color, no rashes Neuro: alert and oriented x 3, non-focal Pysch: normal affect  EKG Last EKG reviewed viewed shows normal sinus rhythm with no ST segment changes. BMET    Component Value Date/Time   NA 139 02/12/2014 1536   K 4.6 02/12/2014 1536   CL 102 02/12/2014 1536   CO2 30 02/12/2014 1536   GLUCOSE 99 02/12/2014 1536   BUN 17 02/12/2014 1536   CREATININE 0.96 02/12/2014 1536   CREATININE 0.86 01/26/2014 2359   CALCIUM 9.7 02/12/2014 1536   GFRNONAA 73 02/12/2014 1536   GFRNONAA 82* 01/26/2014 2359   GFRAA 84 02/12/2014 1536   GFRAA >90 01/26/2014 2359    Lipid Panel     Component Value Date/Time   CHOL  Value: 152        ATP III CLASSIFICATION:  <200     mg/dL   Desirable  200-239  mg/dL   Borderline High  >=240    mg/dL   High        07/31/2009 0740   TRIG 51 07/31/2009 0740   HDL 62 07/31/2009 0740   CHOLHDL 2.5 07/31/2009 0740   VLDL 10 07/31/2009 0740   LDLCALC  Value: 80        Total Cholesterol/HDL:CHD Risk Coronary Heart Disease Risk Table                     Men   Women  1/2 Average Risk   3.4   3.3  Average Risk       5.0   4.4  2 X Average Risk   9.6   7.1  3 X Average Risk  23.4   11.0        Use the calculated Patient Ratio above and the CHD Risk Table to determine  the patient's CHD Risk.        ATP III CLASSIFICATION (LDL):  <100     mg/dL   Optimal  100-129  mg/dL   Near or Above                    Optimal  130-159  mg/dL   Borderline  160-189  mg/dL   High  >190     mg/dL   Very High 07/31/2009 0740    CBC    Component Value Date/Time   WBC 7.6 01/26/2014 2359   RBC 3.89 01/26/2014 2359   HGB 11.7* 01/26/2014 2359   HCT 35.1* 01/26/2014 2359   PLT 294 01/26/2014 2359   MCV 90.2 01/26/2014 2359   MCH 30.1 01/26/2014 2359   MCHC 33.3 01/26/2014 2359   RDW 13.4 01/26/2014 2359   LYMPHSABS 2.3 01/26/2014 2359   MONOABS 0.7 01/26/2014 2359   EOSABS 0.3 01/26/2014 2359   BASOSABS 0.0 01/26/2014 2359

## 2014-02-21 NOTE — Assessment & Plan Note (Signed)
She has mild cardiomegaly by chest x-ray and CT scan noted over the last 2 years. Risk factors include obesity, hypertension, and smoking with COPD. Most of this is manifested some lower extremity edema at present but do not see overt heart failure.  A long talk with her today about her risk of developing progressive problems with cardiac decompensation. I've advised her to quit smoking or cut back to as little as possible, continue to lose weight, good blood pressure control, and I've increased her Lasix to 80 mg every morning. She will get scales and weigh herself daily. I like to see her dry weight down to about 5 additional pounds.  She'll followup with Feliciana Rossetti. . She'll continue anticoagulation for a total of 6 months for DVT. As long as blood pressure under good control, weight stable, and edema under control, no indication to have any further cardiac assessment this time. I am reluctant at this point to add an ACE or ARB but if her blood pressure remains elevated would definitely begin.  I would not use a beta blocker this time with her history of bipolar disorder.

## 2014-02-21 NOTE — Patient Instructions (Addendum)
Your lasix has been increased to 80 mg daily. Please return to the clinic in one week for labwork.

## 2014-02-21 NOTE — Assessment & Plan Note (Signed)
Add ACE inhibitor or ARB at next visit if blood pressure not less than 130/90. I would not use a beta blocker with her history of bipolar disorder.

## 2014-02-22 DIAGNOSIS — T8189XA Other complications of procedures, not elsewhere classified, initial encounter: Secondary | ICD-10-CM | POA: Diagnosis not present

## 2014-02-22 DIAGNOSIS — I10 Essential (primary) hypertension: Secondary | ICD-10-CM | POA: Diagnosis not present

## 2014-02-22 DIAGNOSIS — IMO0002 Reserved for concepts with insufficient information to code with codable children: Secondary | ICD-10-CM | POA: Diagnosis not present

## 2014-02-22 DIAGNOSIS — Z79899 Other long term (current) drug therapy: Secondary | ICD-10-CM | POA: Diagnosis not present

## 2014-02-22 DIAGNOSIS — I509 Heart failure, unspecified: Secondary | ICD-10-CM | POA: Diagnosis not present

## 2014-02-22 DIAGNOSIS — Z981 Arthrodesis status: Secondary | ICD-10-CM | POA: Diagnosis not present

## 2014-02-22 DIAGNOSIS — Y838 Other surgical procedures as the cause of abnormal reaction of the patient, or of later complication, without mention of misadventure at the time of the procedure: Secondary | ICD-10-CM | POA: Diagnosis not present

## 2014-02-22 DIAGNOSIS — Z7901 Long term (current) use of anticoagulants: Secondary | ICD-10-CM | POA: Diagnosis not present

## 2014-02-22 DIAGNOSIS — M069 Rheumatoid arthritis, unspecified: Secondary | ICD-10-CM | POA: Diagnosis not present

## 2014-02-22 DIAGNOSIS — Z86718 Personal history of other venous thrombosis and embolism: Secondary | ICD-10-CM | POA: Diagnosis not present

## 2014-02-23 ENCOUNTER — Emergency Department (HOSPITAL_COMMUNITY): Payer: Medicaid Other

## 2014-02-23 ENCOUNTER — Emergency Department (HOSPITAL_COMMUNITY)
Admission: EM | Admit: 2014-02-23 | Discharge: 2014-02-23 | Disposition: A | Discharge status: Home / Self Care | Payer: Medicaid Other | Attending: Emergency Medicine | Admitting: Emergency Medicine

## 2014-02-23 ENCOUNTER — Encounter (HOSPITAL_COMMUNITY): Payer: Self-pay | Admitting: Emergency Medicine

## 2014-02-23 DIAGNOSIS — Z79899 Other long term (current) drug therapy: Secondary | ICD-10-CM | POA: Insufficient documentation

## 2014-02-23 DIAGNOSIS — R011 Cardiac murmur, unspecified: Secondary | ICD-10-CM | POA: Insufficient documentation

## 2014-02-23 DIAGNOSIS — F411 Generalized anxiety disorder: Secondary | ICD-10-CM | POA: Diagnosis not present

## 2014-02-23 DIAGNOSIS — Y92410 Unspecified street and highway as the place of occurrence of the external cause: Secondary | ICD-10-CM | POA: Diagnosis not present

## 2014-02-23 DIAGNOSIS — Z8679 Personal history of other diseases of the circulatory system: Secondary | ICD-10-CM | POA: Insufficient documentation

## 2014-02-23 DIAGNOSIS — F329 Major depressive disorder, single episode, unspecified: Secondary | ICD-10-CM | POA: Diagnosis not present

## 2014-02-23 DIAGNOSIS — F3289 Other specified depressive episodes: Secondary | ICD-10-CM | POA: Diagnosis not present

## 2014-02-23 DIAGNOSIS — Y9389 Activity, other specified: Secondary | ICD-10-CM | POA: Insufficient documentation

## 2014-02-23 DIAGNOSIS — M129 Arthropathy, unspecified: Secondary | ICD-10-CM | POA: Insufficient documentation

## 2014-02-23 DIAGNOSIS — K219 Gastro-esophageal reflux disease without esophagitis: Secondary | ICD-10-CM | POA: Insufficient documentation

## 2014-02-23 DIAGNOSIS — I10 Essential (primary) hypertension: Secondary | ICD-10-CM | POA: Insufficient documentation

## 2014-02-23 DIAGNOSIS — F319 Bipolar disorder, unspecified: Secondary | ICD-10-CM | POA: Insufficient documentation

## 2014-02-23 DIAGNOSIS — M549 Dorsalgia, unspecified: Secondary | ICD-10-CM

## 2014-02-23 DIAGNOSIS — J45909 Unspecified asthma, uncomplicated: Secondary | ICD-10-CM | POA: Insufficient documentation

## 2014-02-23 DIAGNOSIS — F209 Schizophrenia, unspecified: Secondary | ICD-10-CM | POA: Diagnosis not present

## 2014-02-23 DIAGNOSIS — IMO0002 Reserved for concepts with insufficient information to code with codable children: Secondary | ICD-10-CM | POA: Diagnosis present

## 2014-02-23 DIAGNOSIS — I509 Heart failure, unspecified: Secondary | ICD-10-CM | POA: Insufficient documentation

## 2014-02-23 MED ORDER — CYCLOBENZAPRINE HCL 10 MG PO TABS: 10.0000 mg | ORAL_TABLET | Freq: Two times a day (BID) | ORAL | Status: DC | PRN

## 2014-02-23 MED ORDER — MELOXICAM 15 MG PO TABS: 15.0000 mg | ORAL_TABLET | Freq: Every day | ORAL | Status: DC

## 2014-02-23 NOTE — ED Notes (Signed)
Patient transported to X-ray 

## 2014-02-23 NOTE — Progress Notes (Signed)
[facility]  NAME:  Lori Bean, Lori Bean          ACCOUNT NO.:  [id number]  MEDICAL RECORD NO.:  [id number]      DATE OF BIRTH:  October 14, 1970  PHYSICIAN:  Ricard Dillon, M.D. VISIT DATE:  02/22/2014                                  OFFICE VISIT   CHIEF COMPLAINT:  Review of wound in her lower lumbar spine.  HISTORY OF PRESENT ILLNESS:  Lori Bean is a patient who underwent surgery on December 28, 2013, for lumbar spinal stenosis by Dr. Hal Neer. She underwent a posterior lumbar fusion.  She tolerated the procedure well and in general the wound has almost completely healed.  However, she is here for our review of an open area.  Her boyfriend has been applying hydrogen peroxide to this area and she is here for our review of this.  PAST MEDICAL HISTORY: 1. DVT. 2. Hypertension. 3. Congestive heart failure. 4. Rheumatoid arthritis on prednisone. 5. Herniated disk.  SURGICAL HISTORY:  Apparently had 3 separate back surgeries most recent on December 28, 2013, as described.  MEDICATIONS: 1. Abilify 20 mg at bedtime. 2. Proventil 2 puffs q.4 p.r.n. 3. Norvasc 10 daily. 4. Nexium 40 daily. 5. Neurontin 300 b.i.d. 6. Robaxin 750 q.i.d. 7. Percocet 10/325 q.4 p.r.n. 8. Deltasone 10 two daily. 9. Xarelto 20 daily. 10.VESIcare 5 daily. 11.Kenalog cream 0.1%.  PHYSICAL EXAMINATION:  VITAL SIGNS:  The temperature is 98.5, pulse 90, respirations 18, blood pressure 131/87. The wound is in her lumbar spinal area in the mid aspect of her previous surgical incision measuring 0.3 x 0.3 x 0.1.  I think there is evidence of prior healing here so I think this is improved.  Although this was a very tiny wound the base of it had an adherent eschar which was manually removed.  There was no complications of this.  IMPRESSION:  Surgical wound nonhealing.  I think considerable healing has already gone on here, although this needed debridement to allow closure.  I have  substituted collagen Hydrogel to this area instead of the hydrogen peroxide, covered with a foam dressing.  We will see her again in a week's time.          ______________________________ Ricard Dillon, M.D.     MGR/MEDQ  D:  02/22/2014  T:  02/23/2014  Job:  [id number]

## 2014-02-23 NOTE — Discharge Instructions (Signed)
Take mobic as needed for pain. Take Flexeril as needed for muscle spasm. Refer to attached documents for more information.
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
## 2014-02-24 IMAGING — MR MR LUMBAR SPINE W/O CM
4 of 5 series · 26 of 48 positions shown · non-contrast
Comparison: MRI 08/06/2010

CLINICAL DATA: Low back pain with left-sided pain.

MRI LUMBAR SPINE WITHOUT CONTRAST
TECHNIQUE: Multiplanar and multiecho pulse sequences of the lumbar
spine were obtained without intravenous contrast.

[Series 3: T2 · sagittal · 4.0mm · 0.55mm/px · 5 of 12 slices shown (1 of 2)]
[im 1/12]
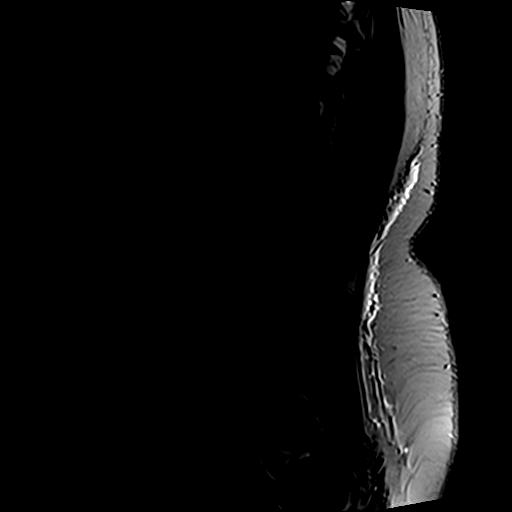
[im 3/12]
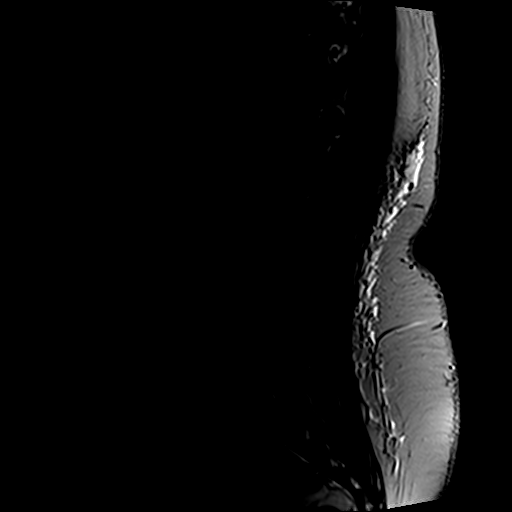
[im 6/12]
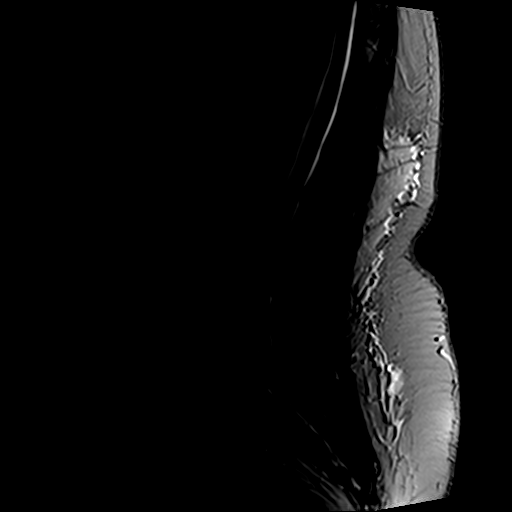
[im 9/12]
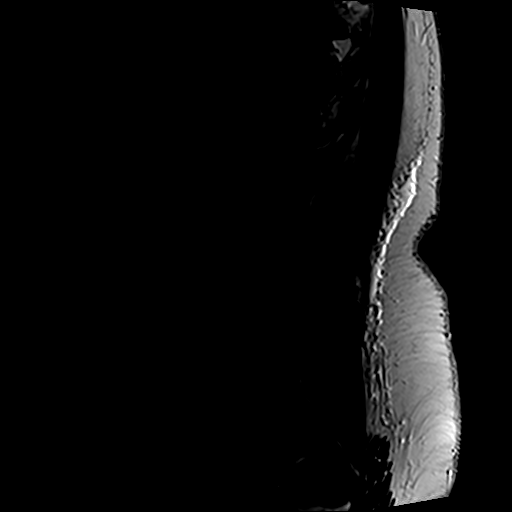
[im 12/12]
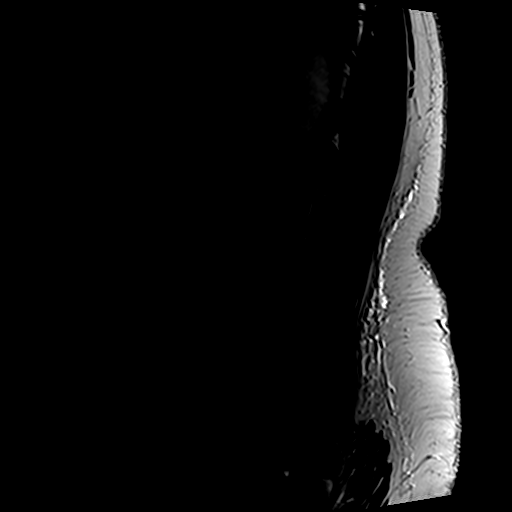

[Series 4: T1 · sagittal · 4.0mm · 0.55mm/px · 6 of 12 slices shown (1 of 2)]
[im 1/12]
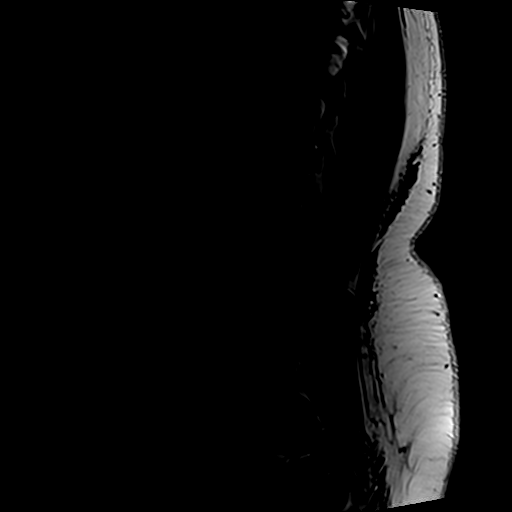
[im 3/12]
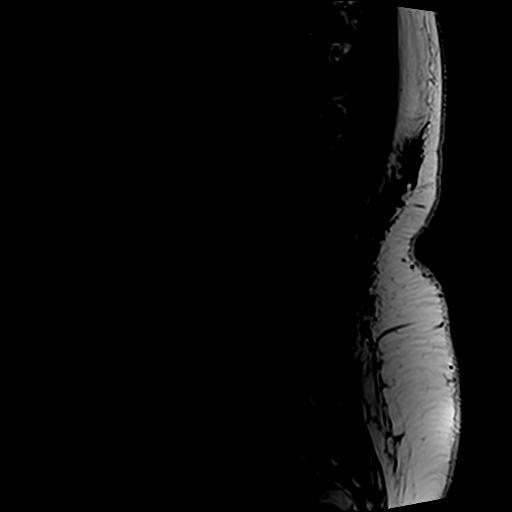
[im 5/12]
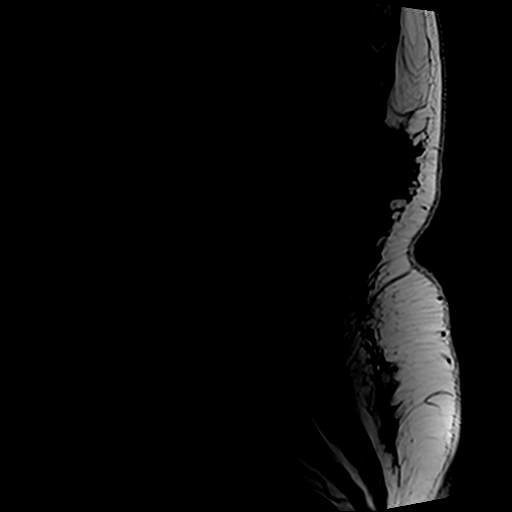
[im 7/12]
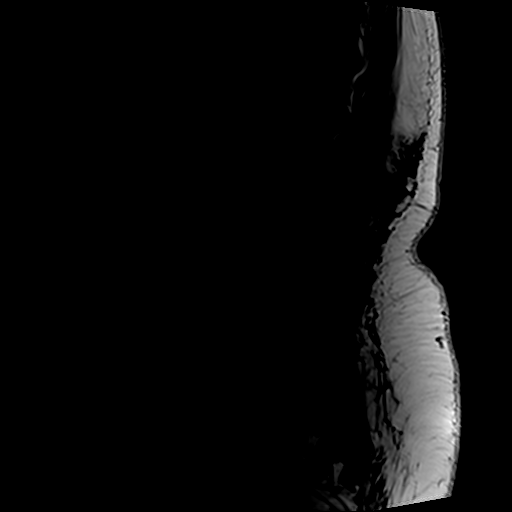
[im 9/12]
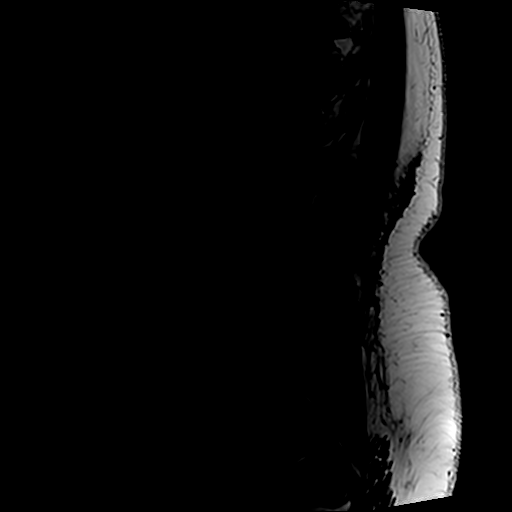
[im 12/12]
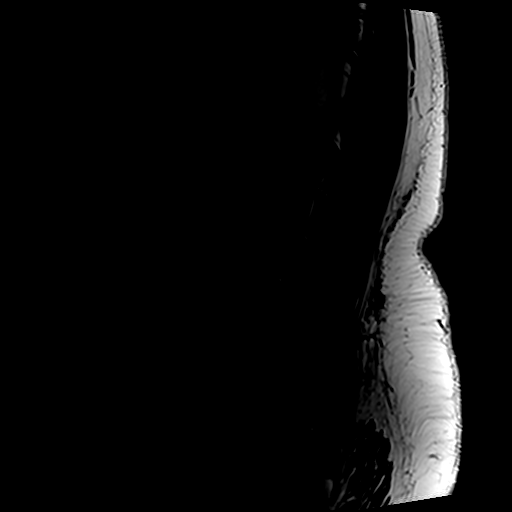

[Series 6: T2 · axial · 4.0mm · 0.66mm/px · z∈[-60,+118]mm · 9 of 31 slices shown (2 of 2)]
[im 1/31]
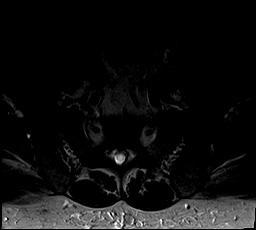
[im 5/31]
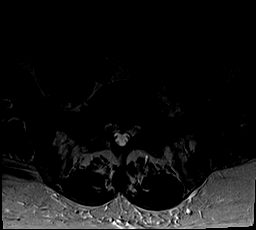
[im 9/31]
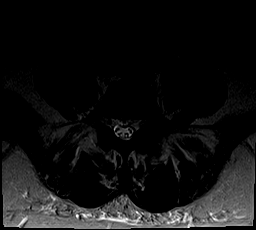
[im 13/31]
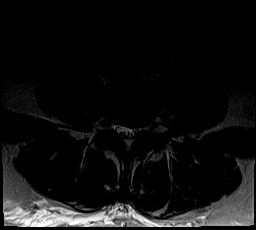
[im 16/31]
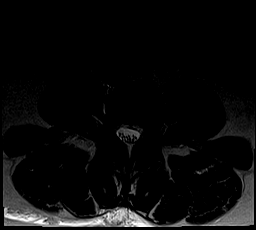
[im 18/31]
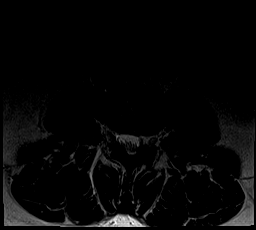
[im 22/31]
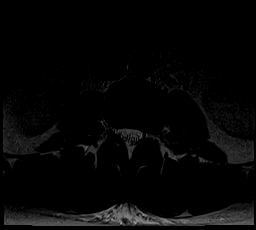
[im 26/31]
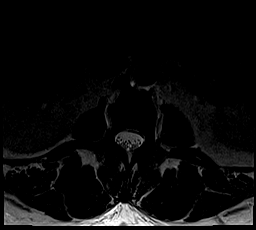
[im 31/31]
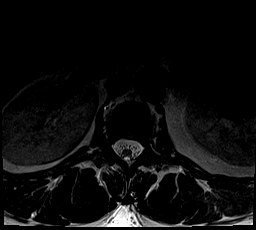

[Series 7: T1 · axial · 4.0mm · 0.33mm/px · z∈[-50,+92]mm · 6 of 32 slices shown (2 of 2)]
[im 3/32]
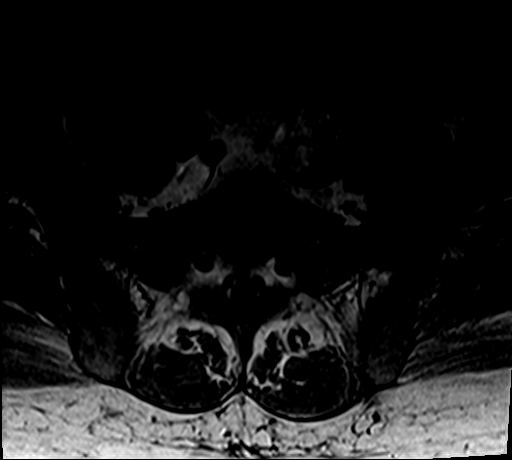
[im 5/32]
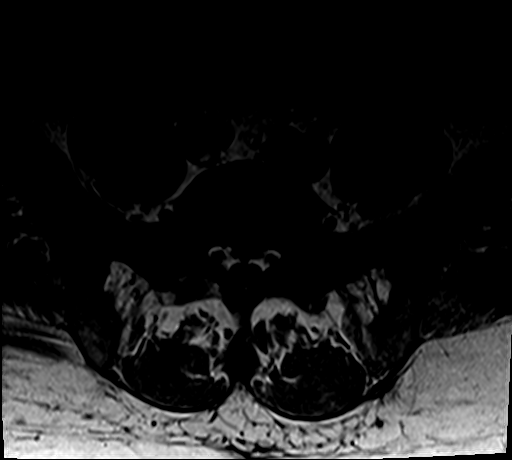
[im 7/32]
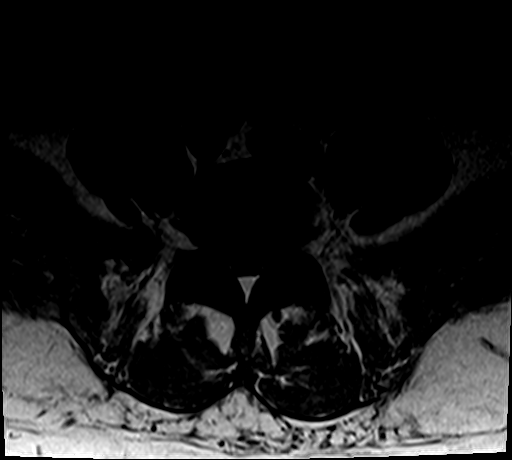
[im 11/32]
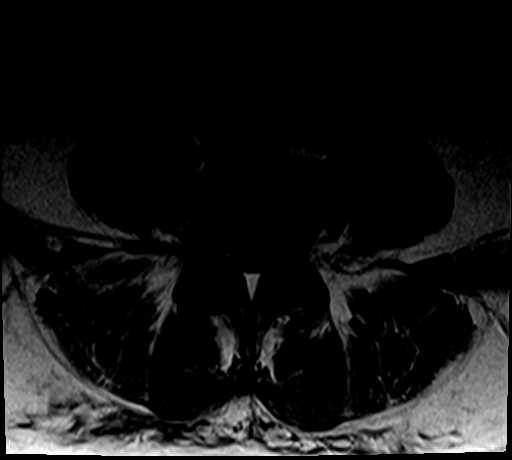
[im 17/32]
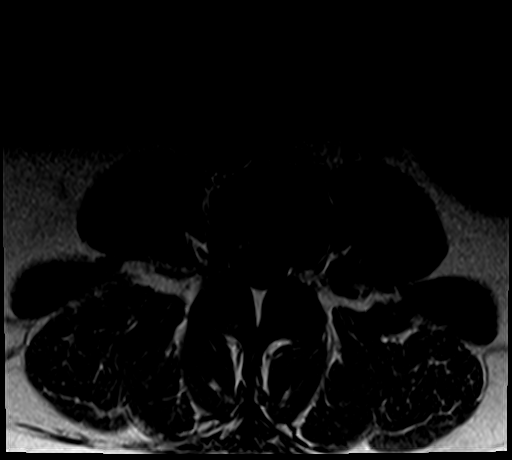
[im 27/32]
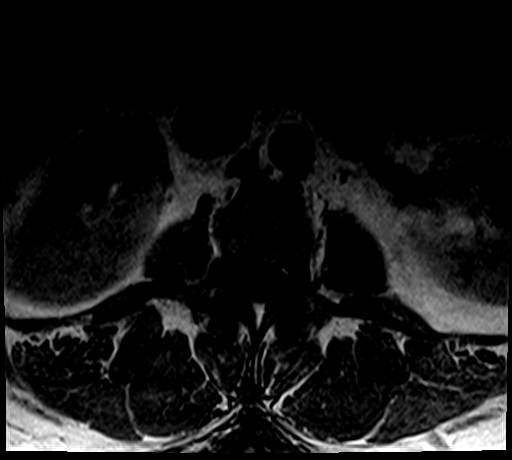

[26 of 48 positions shown; findings below may reference images not displayed]

FINDINGS: Normal lumbar alignment.  Negative for fracture or mass.
No bone marrow or soft tissue edema.  Conus medullaris is normal
and terminates at L1-2.

L1-2:  Normal disc with mild facet degeneration.

L2-3:  Mild disc degeneration and mild facet degeneration without
disc protrusion or spinal stenosis.

L3-4:  Mild disc degeneration and mild to moderate facet
degeneration without significant spinal stenosis.  Facet and
ligamentum flavum hypertrophy bilaterally.

L4-5:  Moderately large left paracentral disc protrusion is
unchanged from the  prior study.  This is flattening the thecal sac
and causing impingement of the left L5 nerve root in the lateral
recess, unchanged.  Moderate facet and ligamentum flavum
hypertrophy with mild central canal stenosis.

L5-S1:  Disc degeneration.  Central disc protrusion and associated
osteophyte.  Facet and ligamentum flavum hypertrophy bilaterally
with mild spinal stenosis, unchanged.
IMPRESSION: No significant change from the MRI 08/06/2010

Left paracentral disc protrusion L4-5 with impingement of the left
L5 nerve root.

Central disc and osteophyte at L5-S1 with mild spinal stenosis.

## 2014-02-26 NOTE — ED Provider Notes (Signed)
Medical screening examination/treatment/procedure(s) were performed by non-physician practitioner and as supervising physician I was immediately available for consultation/collaboration.   EKG Interpretation None        Delice Bison Ward, DO 02/26/14 1508

## 2014-02-28 ENCOUNTER — Ambulatory Visit: Payer: No Typology Code available for payment source | Attending: Internal Medicine

## 2014-02-28 DIAGNOSIS — E669 Obesity, unspecified: Secondary | ICD-10-CM

## 2014-02-28 DIAGNOSIS — I509 Heart failure, unspecified: Secondary | ICD-10-CM

## 2014-02-28 LAB — HEMOGLOBIN A1C
Hgb A1c MFr Bld: 5.9 % — ABNORMAL HIGH (ref <5.7)
Mean Plasma Glucose: 123 mg/dL — ABNORMAL HIGH (ref <117)

## 2014-02-28 LAB — BASIC METABOLIC PANEL
BUN: 13 mg/dL (ref 6–23)
CO2: 28 mEq/L (ref 19–32)
Calcium: 9 mg/dL (ref 8.4–10.5)
Chloride: 105 mEq/L (ref 96–112)
Creat: 0.81 mg/dL (ref 0.50–1.10)
Glucose, Bld: 107 mg/dL — ABNORMAL HIGH (ref 70–99)
Potassium: 4.4 mEq/L (ref 3.5–5.3)
Sodium: 139 mEq/L (ref 135–145)

## 2014-03-12 ENCOUNTER — Ambulatory Visit (HOSPITAL_BASED_OUTPATIENT_CLINIC_OR_DEPARTMENT_OTHER): Payer: No Typology Code available for payment source | Attending: Internal Medicine

## 2014-03-12 ENCOUNTER — Ambulatory Visit: Payer: Medicaid Other | Attending: Internal Medicine | Admitting: Internal Medicine

## 2014-03-14 ENCOUNTER — Encounter (HOSPITAL_BASED_OUTPATIENT_CLINIC_OR_DEPARTMENT_OTHER): Payer: No Typology Code available for payment source

## 2014-03-18 ENCOUNTER — Encounter (HOSPITAL_COMMUNITY): Payer: Self-pay | Admitting: Emergency Medicine

## 2014-03-18 ENCOUNTER — Emergency Department (HOSPITAL_COMMUNITY)
Admission: EM | Admit: 2014-03-18 | Discharge: 2014-03-18 | Disposition: A | Discharge status: Home / Self Care | Payer: Medicaid Other | Attending: Emergency Medicine | Admitting: Emergency Medicine

## 2014-03-18 ENCOUNTER — Emergency Department (HOSPITAL_COMMUNITY): Payer: Medicaid Other

## 2014-03-18 DIAGNOSIS — IMO0002 Reserved for concepts with insufficient information to code with codable children: Secondary | ICD-10-CM | POA: Insufficient documentation

## 2014-03-18 DIAGNOSIS — Z791 Long term (current) use of non-steroidal anti-inflammatories (NSAID): Secondary | ICD-10-CM | POA: Insufficient documentation

## 2014-03-18 DIAGNOSIS — J45909 Unspecified asthma, uncomplicated: Secondary | ICD-10-CM | POA: Diagnosis not present

## 2014-03-18 DIAGNOSIS — R011 Cardiac murmur, unspecified: Secondary | ICD-10-CM | POA: Diagnosis not present

## 2014-03-18 DIAGNOSIS — K219 Gastro-esophageal reflux disease without esophagitis: Secondary | ICD-10-CM | POA: Insufficient documentation

## 2014-03-18 DIAGNOSIS — I209 Angina pectoris, unspecified: Secondary | ICD-10-CM | POA: Diagnosis not present

## 2014-03-18 DIAGNOSIS — F411 Generalized anxiety disorder: Secondary | ICD-10-CM | POA: Insufficient documentation

## 2014-03-18 DIAGNOSIS — L089 Local infection of the skin and subcutaneous tissue, unspecified: Secondary | ICD-10-CM

## 2014-03-18 DIAGNOSIS — Z792 Long term (current) use of antibiotics: Secondary | ICD-10-CM | POA: Diagnosis not present

## 2014-03-18 DIAGNOSIS — F209 Schizophrenia, unspecified: Secondary | ICD-10-CM | POA: Diagnosis not present

## 2014-03-18 DIAGNOSIS — I509 Heart failure, unspecified: Secondary | ICD-10-CM | POA: Diagnosis not present

## 2014-03-18 DIAGNOSIS — L03211 Cellulitis of face: Principal | ICD-10-CM

## 2014-03-18 DIAGNOSIS — F17200 Nicotine dependence, unspecified, uncomplicated: Secondary | ICD-10-CM | POA: Insufficient documentation

## 2014-03-18 DIAGNOSIS — Z87448 Personal history of other diseases of urinary system: Secondary | ICD-10-CM | POA: Diagnosis not present

## 2014-03-18 DIAGNOSIS — K089 Disorder of teeth and supporting structures, unspecified: Secondary | ICD-10-CM | POA: Diagnosis present

## 2014-03-18 DIAGNOSIS — J40 Bronchitis, not specified as acute or chronic: Secondary | ICD-10-CM | POA: Insufficient documentation

## 2014-03-18 DIAGNOSIS — I10 Essential (primary) hypertension: Secondary | ICD-10-CM | POA: Diagnosis not present

## 2014-03-18 DIAGNOSIS — L0201 Cutaneous abscess of face: Secondary | ICD-10-CM | POA: Insufficient documentation

## 2014-03-18 DIAGNOSIS — F319 Bipolar disorder, unspecified: Secondary | ICD-10-CM | POA: Diagnosis not present

## 2014-03-18 DIAGNOSIS — Z79899 Other long term (current) drug therapy: Secondary | ICD-10-CM | POA: Diagnosis not present

## 2014-03-18 DIAGNOSIS — M129 Arthropathy, unspecified: Secondary | ICD-10-CM | POA: Diagnosis not present

## 2014-03-18 LAB — I-STAT CHEM 8, ED
BUN: 14 mg/dL (ref 6–23)
Calcium, Ion: 1.19 mmol/L (ref 1.12–1.23)
Chloride: 102 mEq/L (ref 96–112)
Creatinine, Ser: 0.9 mg/dL (ref 0.50–1.10)
Glucose, Bld: 100 mg/dL — ABNORMAL HIGH (ref 70–99)
HCT: 37 % (ref 36.0–46.0)
Hemoglobin: 12.6 g/dL (ref 12.0–15.0)
Potassium: 4 mEq/L (ref 3.7–5.3)
Sodium: 139 mEq/L (ref 137–147)
TCO2: 24 mmol/L (ref 0–100)

## 2014-03-18 LAB — CBC WITH DIFFERENTIAL/PLATELET
Basophils Absolute: 0 10*3/uL (ref 0.0–0.1)
Basophils Relative: 0 % (ref 0–1)
Eosinophils Absolute: 0.6 10*3/uL (ref 0.0–0.7)
Eosinophils Relative: 5 % (ref 0–5)
HCT: 35.8 % — ABNORMAL LOW (ref 36.0–46.0)
Hemoglobin: 11.6 g/dL — ABNORMAL LOW (ref 12.0–15.0)
Lymphocytes Relative: 27 % (ref 12–46)
Lymphs Abs: 3 10*3/uL (ref 0.7–4.0)
MCH: 29.4 pg (ref 26.0–34.0)
MCHC: 32.4 g/dL (ref 30.0–36.0)
MCV: 90.6 fL (ref 78.0–100.0)
Monocytes Absolute: 0.9 10*3/uL (ref 0.1–1.0)
Monocytes Relative: 8 % (ref 3–12)
Neutro Abs: 6.9 10*3/uL (ref 1.7–7.7)
Neutrophils Relative %: 60 % (ref 43–77)
Platelets: 279 10*3/uL (ref 150–400)
RBC: 3.95 MIL/uL (ref 3.87–5.11)
RDW: 13.6 % (ref 11.5–15.5)
WBC: 11.5 10*3/uL — ABNORMAL HIGH (ref 4.0–10.5)

## 2014-03-18 MED ORDER — MORPHINE SULFATE 4 MG/ML IJ SOLN
4.0000 mg | Freq: Once | INTRAMUSCULAR | Status: AC
  Administered 2014-03-18: 4 mg via INTRAVENOUS
  Filled 2014-03-18: qty 1

## 2014-03-18 MED ORDER — SODIUM CHLORIDE 0.9 % IV BOLUS (SEPSIS)
1000.0000 mL | Freq: Once | INTRAVENOUS | Status: AC
  Administered 2014-03-18: 1000 mL via INTRAVENOUS

## 2014-03-18 MED ORDER — TRAMADOL HCL 50 MG PO TABS: 50.0000 mg | ORAL_TABLET | Freq: Four times a day (QID) | ORAL | Status: DC | PRN

## 2014-03-18 MED ORDER — AMOXICILLIN-POT CLAVULANATE 875-125 MG PO TABS: 1.0000 | ORAL_TABLET | Freq: Two times a day (BID) | ORAL | Status: DC

## 2014-03-18 MED ORDER — IOHEXOL 300 MG/ML  SOLN
75.0000 mL | Freq: Once | INTRAMUSCULAR | Status: AC | PRN
Start: 2014-03-18 — End: 2014-03-18
  Administered 2014-03-18: 75 mL via INTRAVENOUS

## 2014-03-18 NOTE — ED Notes (Signed)
Returned from CT scan.  Fluids continue to infuse. Pt snoring, awakens easily to voice.

## 2014-03-18 NOTE — Discharge Instructions (Signed)
The labs and imaging here show that you have a facial infection - with likely tooth as a source. See the Dentist as requested. Take the antibiotics a prescribed. Please return to the ER if your symptoms worsen; you have increased pain, fevers, chills, inability to keep any medications down, confusion. Otherwise see the outpatient doctor as requested.   Facial Infection You have an infection of your face. This requires special attention to help prevent serious problems. Infections in facial wounds can cause poor healing and scars. They can also spread to deeper tissues, especially around the eye. Wound and dental infections can lead to sinusitis, infection of the eye socket, and even meningitis. Permanent damage to the skin, eye, and nervous system may result if facial infections are not treated properly. With severe infections, hospital care for IV antibiotic injections may be needed if they don't respond to oral antibiotics. Antibiotics must be taken for the full course to insure the infection is eliminated. If the infection came from a bad tooth, it may have to be extracted when the infection is under control. Warm compresses may be applied to reduce skin irritation and remove drainage. You might need a tetanus shot now if:  You cannot remember when your last tetanus shot was.  You have never had a tetanus shot.  The object that caused your wound was dirty. If you need a tetanus shot, and you decide not to get one, there is a rare chance of getting tetanus. Sickness from tetanus can be serious. If you got a tetanus shot, your arm may swell, get red and warm to the touch at the shot site. This is common and not a problem. SEEK IMMEDIATE MEDICAL CARE IF:   You have increased swelling, redness, or trouble breathing.  You have a severe headache, dizziness, nausea, or vomiting.  You develop problems with your eyesight.  You have a fever. Document Released: 10/08/2004 Document Revised:  11/23/2011 Document Reviewed: 08/31/2005 [facility] Patient Information 2015 [place], [place]. This information is not intended to replace advice given to you by your health care provider. Make sure you discuss any questions you have with your health care provider. Periodontal Disease Periodontal disease, or gum disease, is a type of oral disease that affects the surrounding and supporting tissues of the teeth. These include the gums (gingivae), ligaments, and tooth socket (alveolar bone). Periodontal disease can affect one tooth or many teeth. If left untreated, it may lead to tooth loss.  CAUSES The main cause of periodontal disease is dental plaque, which contains harmful bacteria. These bacteria can cause the gums to become inflamed and infected. Further progression of the disease can damage the other supporting tissues.  RISK FACTORS  Diabetes.   Smoking and tobacco use.   Genetics.   Hormonal changes of puberty, menopause, and pregnancy.   Stress.   Clenching or grinding your teeth.   Substance abuse.  Poor nutrition.   Diseases that interfere with the body's immune system.   Certain medicines. SIGNS AND SYMPTOMS  Red or swollen gums.  Bad breath that does not go away.  Gums that have pulled away from the teeth.  Gums that bleed easily.  Permanent teeth that are loose or separating.  Pain when chewing.  Changes in the way your teeth fit together.  Sensitive teeth. DIAGNOSIS  A thorough examination of the periodontal tissues will be done by your dentist. X-rays may be needed. Evaluation of your medical history will be needed to see if there are other  factors or underlying conditions that may contribute to the disease. TREATMENT The number and types of treatment will vary depending on the extent of the disease. Treatment may include brushing and flossing only. Further disease progression may necessitate scaling and root planing or even surgery. The main goal is  to control the infection. Good oral hygiene at home is necessary for the success of all types of treatment. HOME CARE INSTRUCTIONS   Practice good oral hygiene. This includes flossing and brushing your teeth every day.   See your dentist regularly, at least 2 times per year.   Stop smoking if you smoke.  Eat a well-balanced diet. SEEK IMMEDIATE DENTAL CARE IF:   You have any signs or symptoms of periodontal disease along with:  Swelling of your face, neck, or jaw.  Inability to open your mouth.  Severe pain uncontrolled by pain medicine.  You have a fever or persistent symptoms for more than 2-3 days.  You have a fever and your symptoms suddenly get worse. Document Released: 09/03/2003 Document Revised: 05/03/2013 Document Reviewed: 02/07/2013 [facility] Patient Information 2015 [place], [place]. This information is not intended to replace advice given to you by your health care provider. Make sure you discuss any questions you have with your health care provider.

## 2014-03-18 NOTE — ED Provider Notes (Signed)
CSN: [id number]     Arrival date & time 03/18/14  0329 History   First MD Initiated Contact with Patient 03/18/14 [phone]     Chief Complaint  Patient presents with  . Dental Pain     (Consider location/radiation/quality/duration/timing/severity/associated sxs/prior Treatment) HPI Comments: PT comes in with cc of right jaw pain. Pain started all of a sudden. There is some pain in the jaw, extending to the ears. There is no ear ringing. There is no toothache. No n/v/f/c. On Xarelto. Not diabetic.  Patient is a 43 y.o. female presenting with tooth pain. The history is provided by the patient.  Dental Pain Associated symptoms: no headaches and no neck pain     Past Medical History  Diagnosis Date  . Asthma   . Hypertension   . Anxiety   . Snoring disorder     Pt stated my boyfriend always wakes me up and tells me to breathe  . Heart murmur   . CHF (congestive heart failure)   . Shortness of breath   . Bronchitis     hx of  . Bipolar affective disorder     Schizophrenia  . Depression   . GERD (gastroesophageal reflux disease)   . Arthritis   . Overactive bladder   . Schizophrenia   . Sciatica   . Anginal pain     occ from asthma   Past Surgical History  Procedure Laterality Date  . Rotator cuff repair      Right shoulder  . Tubal ligation    . Gallstones reomved    . Lumbar laminectomy/decompression microdiscectomy  04/01/2012    Procedure: LUMBAR LAMINECTOMY/DECOMPRESSION MICRODISCECTOMY 2 LEVELS;  Surgeon: Faythe Ghee, MD;  Location: [facility];  Service: Neurosurgery;  Laterality: Left;  Lumbar four-five, lumbar five sacral one microdiscectomy   . Back surgery    . Lumbar wound debridement  04/29/2012    Procedure: LUMBAR WOUND DEBRIDEMENT;  Surgeon: Faythe Ghee, MD;  Location: [place] [facility];  Service: Neurosurgery;  Laterality: N/A;  lumbar wound debridement  . Cholecystectomy    . Eye surgery      Metal plate in right eye   Family History  Problem  Relation Age of Onset  . Diabetes Mother   . Diabetes Father   . Heart disease Paternal Aunt   . Cancer Paternal Aunt    History  Substance Use Topics  . Smoking status: Current Every Day Smoker -- 0.25 packs/day for 9 years    Types: Cigarettes  . Smokeless tobacco: Never Used  . Alcohol Use: Yes     Comment: One 40 oz. beer/day average--some tequilla   OB History   Grav Para Term Preterm Abortions TAB SAB Ect Mult Living                 Review of Systems  Constitutional: Negative for activity change.  Respiratory: Negative for shortness of breath.   Cardiovascular: Negative for chest pain.  Gastrointestinal: Negative for nausea, vomiting and abdominal pain.  Genitourinary: Negative for dysuria.  Musculoskeletal: Negative for neck pain.  Neurological: Negative for headaches.      Allergies  Aspirin  Home Medications   Prior to Admission medications   Medication Sig Start Date End Date Taking? Authorizing Provider  albuterol (PROVENTIL HFA;VENTOLIN HFA) 108 (90 BASE) MCG/ACT inhaler Inhale 2 puffs into the lungs every 6 (six) hours as needed for wheezing or shortness of breath. 02/12/14  Yes Lance Bosch, NP  amLODipine ([place])  10 MG tablet Take 1 tablet (10 mg total) by mouth daily. 02/12/14  Yes Lance Bosch, NP  ARIPiprazole (ABILIFY) 20 MG tablet Take 20 mg by mouth at bedtime.   Yes Historical Provider, MD  cyclobenzaprine (FLEXERIL) 10 MG tablet Take 1 tablet (10 mg total) by mouth 2 (two) times daily as needed for muscle spasms. 02/23/14  Yes Kaitlyn Szekalski, PA-C  esomeprazole (NEXIUM) 40 MG capsule Take 40 mg by mouth daily at 12 noon.   Yes Historical Provider, MD  furosemide (LASIX) 80 MG tablet Take 1 tablet (80 mg total) by mouth daily. 02/21/14  Yes Renella Cunas, MD  gabapentin (NEURONTIN) 300 MG capsule Take 1 capsule (300 mg total) by mouth 2 (two) times daily. 02/12/14  Yes Lance Bosch, NP  meloxicam (MOBIC) 15 MG tablet Take 1 tablet (15 mg total) by  mouth daily. 02/23/14  Yes Kaitlyn Szekalski, PA-C  methocarbamol (ROBAXIN-750) 750 MG tablet Take 1 tablet (750 mg total) by mouth 4 (four) times daily. 11/01/13  Yes Kalman Drape, MD  omeprazole (PRILOSEC) 20 MG capsule Take 1 capsule (20 mg total) by mouth daily. 02/12/14  Yes Lance Bosch, NP  oxyCODONE-acetaminophen (PERCOCET) 10-325 MG per tablet Take 1-2 tablets by mouth every 4 (four) hours as needed for pain. 01/02/14  Yes Charlie Pitter, MD  predniSONE (DELTASONE) 10 MG tablet Take 2 tablets (20 mg total) by mouth daily. 11/12/13  Yes Tiffany Marilu Favre, PA-C  rivaroxaban (XARELTO) 20 MG TABS tablet Take 1 tablet (20 mg total) by mouth daily with supper. 02/12/14  Yes Lance Bosch, NP  solifenacin (VESICARE) 5 MG tablet Take 1 tablet (5 mg total) by mouth daily. 02/21/14  Yes Lance Bosch, NP  traMADol (ULTRAM) 50 MG tablet Take 1 tablet (50 mg total) by mouth 2 (two) times daily as needed. 02/12/14  Yes Lance Bosch, NP  triamcinolone cream (KENALOG) 0.1 % Apply 1 application topically 2 (two) times daily as needed (eczema). Do not apply to face 02/21/14  Yes Lance Bosch, NP  amoxicillin-clavulanate (AUGMENTIN) 875-125 MG per tablet Take 1 tablet by mouth 2 (two) times daily. 03/18/14   Varney Biles, MD  traMADol (ULTRAM) 50 MG tablet Take 1 tablet (50 mg total) by mouth every 6 (six) hours as needed. 03/18/14   Lochlyn Zullo Kathrynn Humble, MD   BP 143/80  Pulse 73  Temp(Src) 98.4 F (36.9 C)  Resp 24  Ht 5\' 2"  (1.575 m)  Wt 213 lb (96.616 kg)  BMI 38.95 kg/m2  SpO2 96%  LMP 03/18/2014 Physical Exam  Nursing note and vitals reviewed. Constitutional: She is oriented to person, place, and time. She appears well-developed and well-nourished.  HENT:  Head: Normocephalic and atraumatic.  Right face swelling. Dental exam reveals no tenderness, no gingival abscess. On the buccal mucosa right side, there is edema with fluctuance and tenderness. No trismus.  Eyes: EOM are normal. Pupils are equal, round,  and reactive to light.  Neck: Neck supple.  Cardiovascular: Normal rate, regular rhythm and normal heart sounds.   No murmur heard. Pulmonary/Chest: Effort normal. No respiratory distress.  Abdominal: Soft. She exhibits no distension. There is no tenderness. There is no rebound and no guarding.  Neurological: She is alert and oriented to person, place, and time.  Skin: Skin is warm and dry.    ED Course  Procedures (including critical care time) Labs Review Labs Reviewed  CBC WITH DIFFERENTIAL - Abnormal; Notable for the following:  WBC 11.5 (*)    Hemoglobin 11.6 (*)    HCT 35.8 (*)    All other components within normal limits  I-STAT CHEM 8, ED - Abnormal; Notable for the following:    Glucose, Bld 100 (*)    All other components within normal limits    Imaging Review Ct Maxillofacial W/cm  03/18/2014   CLINICAL DATA:  Facial swelling.  EXAM: CT MAXILLOFACIAL WITH CONTRAST  TECHNIQUE: Multidetector CT imaging of the maxillofacial structures was performed with intravenous contrast. Multiplanar CT image reconstructions were also generated. A small metallic BB was placed on the right temple in order to reliably differentiate right from left.  CONTRAST:  19mL OMNIPAQUE IOHEXOL 300 MG/ML  SOLN  COMPARISON:  None.  FINDINGS: There is edema within the right cheek, centered in the buccal space and throughout the masticator compartment. The muscles of mastication are enlarged as a result. There is likely trismus as the right TMJ is located and the left is anteriorly subluxed. The cause is usually odontogenic, although no periapical erosion or definite cavity is identified. There is no visible stone within the parotid duct, as permitted by dental amalgam; no dilation of the duct or primary inflammation in the parotid gland. No primary inflammation in the floor of mouth or within the submandibular glands. No evidence of tonsillitis or sinusitis. The major vessels in the neck are patent. Orbits are  negative. Clear paranasal sinuses.  IMPRESSION: Edema in the right buccal and masticator spaces which is likely infection. The source is not identified, but based on location odontogenic origin is favored. No abscess.   Electronically Signed   By: Jorje Guild M.D.   On: 03/18/2014 06:24     EKG Interpretation None      MDM   Final diagnoses:  Facial infection    Pt comes in w/ cc of facial swelling. CT shows infection of the right buccal and masticular region. No abscess. No trismus, no neuro complains or deficits. Augmentin started. Dentist f/u given. Return precautions verbalized.   Varney Biles, MD 03/18/14 [phone]

## 2014-03-18 NOTE — ED Notes (Signed)
The pt has had rt jaw pain since this am with swelling.  C.o pain

## 2014-03-18 NOTE — ED Notes (Signed)
Patient transported to CT 

## 2014-03-18 NOTE — ED Notes (Signed)
Pt c/o pain inside right cheek that radiates to right ear.  Denies injury, denies dental caries.  Pain with opening jaw.  Is asking for something for pain.

## 2014-03-21 ENCOUNTER — Telehealth: Payer: Self-pay | Admitting: Internal Medicine

## 2014-04-02 ENCOUNTER — Telehealth: Payer: Self-pay | Admitting: Internal Medicine

## 2014-04-02 ENCOUNTER — Telehealth: Payer: Self-pay | Admitting: Emergency Medicine

## 2014-04-02 NOTE — Telephone Encounter (Signed)
Pt was last seen on 02/12/14 with PCP and 02/21/14 with Dr Verl Blalock and was prescribed Xarelto. Pt's MCD coverage has ended and needs PASS assistance, in the meantime pt has run out of medication and asking if we have samples or some other assistance while she applies for PASS. Please f/u with pt.

## 2014-04-02 NOTE — Telephone Encounter (Signed)
Pt ran out of medication Xarelto 20 mg dose 2 weeks ago due to ins coverage. Pt in process of PASS program for med refills. Pt was told to come here for samples. Pt given scheduled appt 04/05/14 for nurse visit

## 2014-04-03 ENCOUNTER — Emergency Department (HOSPITAL_COMMUNITY): Payer: No Typology Code available for payment source

## 2014-04-03 ENCOUNTER — Encounter (HOSPITAL_COMMUNITY): Payer: Self-pay | Admitting: Emergency Medicine

## 2014-04-03 ENCOUNTER — Observation Stay (HOSPITAL_COMMUNITY)
Admission: EM | Admit: 2014-04-03 | Discharge: 2014-04-04 | Disposition: A | Discharge status: Home / Self Care | Payer: Medicaid Other | Attending: Internal Medicine | Admitting: Internal Medicine

## 2014-04-03 DIAGNOSIS — I517 Cardiomegaly: Secondary | ICD-10-CM

## 2014-04-03 DIAGNOSIS — Z86718 Personal history of other venous thrombosis and embolism: Secondary | ICD-10-CM | POA: Insufficient documentation

## 2014-04-03 DIAGNOSIS — R079 Chest pain, unspecified: Secondary | ICD-10-CM | POA: Diagnosis present

## 2014-04-03 DIAGNOSIS — K219 Gastro-esophageal reflux disease without esophagitis: Secondary | ICD-10-CM | POA: Insufficient documentation

## 2014-04-03 DIAGNOSIS — I10 Essential (primary) hypertension: Secondary | ICD-10-CM

## 2014-04-03 DIAGNOSIS — E669 Obesity, unspecified: Secondary | ICD-10-CM | POA: Insufficient documentation

## 2014-04-03 DIAGNOSIS — F17200 Nicotine dependence, unspecified, uncomplicated: Secondary | ICD-10-CM | POA: Insufficient documentation

## 2014-04-03 DIAGNOSIS — I82409 Acute embolism and thrombosis of unspecified deep veins of unspecified lower extremity: Secondary | ICD-10-CM

## 2014-04-03 DIAGNOSIS — Z6836 Body mass index (BMI) 36.0-36.9, adult: Secondary | ICD-10-CM | POA: Insufficient documentation

## 2014-04-03 DIAGNOSIS — I509 Heart failure, unspecified: Secondary | ICD-10-CM | POA: Insufficient documentation

## 2014-04-03 DIAGNOSIS — F319 Bipolar disorder, unspecified: Secondary | ICD-10-CM | POA: Insufficient documentation

## 2014-04-03 DIAGNOSIS — R0602 Shortness of breath: Secondary | ICD-10-CM

## 2014-04-03 DIAGNOSIS — J45909 Unspecified asthma, uncomplicated: Secondary | ICD-10-CM | POA: Insufficient documentation

## 2014-04-03 DIAGNOSIS — N3281 Overactive bladder: Secondary | ICD-10-CM

## 2014-04-03 DIAGNOSIS — M79609 Pain in unspecified limb: Secondary | ICD-10-CM

## 2014-04-03 DIAGNOSIS — R0789 Other chest pain: Principal | ICD-10-CM | POA: Insufficient documentation

## 2014-04-03 DIAGNOSIS — Z9089 Acquired absence of other organs: Secondary | ICD-10-CM | POA: Insufficient documentation

## 2014-04-03 DIAGNOSIS — F411 Generalized anxiety disorder: Secondary | ICD-10-CM | POA: Insufficient documentation

## 2014-04-03 DIAGNOSIS — M1990 Unspecified osteoarthritis, unspecified site: Secondary | ICD-10-CM

## 2014-04-03 DIAGNOSIS — F419 Anxiety disorder, unspecified: Secondary | ICD-10-CM

## 2014-04-03 DIAGNOSIS — F17210 Nicotine dependence, cigarettes, uncomplicated: Secondary | ICD-10-CM | POA: Diagnosis present

## 2014-04-03 DIAGNOSIS — Z720 Tobacco use: Secondary | ICD-10-CM

## 2014-04-03 LAB — CBC
HCT: 36.1 % (ref 36.0–46.0)
Hemoglobin: 12.2 g/dL (ref 12.0–15.0)
MCH: 30.4 pg (ref 26.0–34.0)
MCHC: 33.8 g/dL (ref 30.0–36.0)
MCV: 90 fL (ref 78.0–100.0)
Platelets: 319 10*3/uL (ref 150–400)
RBC: 4.01 MIL/uL (ref 3.87–5.11)
RDW: 13.9 % (ref 11.5–15.5)
WBC: 11 10*3/uL — ABNORMAL HIGH (ref 4.0–10.5)

## 2014-04-03 LAB — I-STAT TROPONIN, ED: Troponin i, poc: 0 ng/mL (ref 0.00–0.08)

## 2014-04-03 LAB — PRO B NATRIURETIC PEPTIDE: Pro B Natriuretic peptide (BNP): 137.6 pg/mL — ABNORMAL HIGH (ref 0–125)

## 2014-04-03 LAB — BASIC METABOLIC PANEL
Anion gap: 10 (ref 5–15)
BUN: 12 mg/dL (ref 6–23)
CO2: 25 mEq/L (ref 19–32)
Calcium: 9.5 mg/dL (ref 8.4–10.5)
Chloride: 106 mEq/L (ref 96–112)
Creatinine, Ser: 0.86 mg/dL (ref 0.50–1.10)
GFR calc Af Amer: >90 mL/min (ref >90)
GFR calc non Af Amer: 81 mL/min — ABNORMAL LOW (ref >90)
Glucose, Bld: 129 mg/dL — ABNORMAL HIGH (ref 70–99)
Potassium: 4 mEq/L (ref 3.7–5.3)
Sodium: 141 mEq/L (ref 137–147)

## 2014-04-03 LAB — TROPONIN I: Troponin I: <0.3 ng/mL (ref <0.30)

## 2014-04-03 MED ORDER — ZOLPIDEM TARTRATE 5 MG PO TABS
5.0000 mg | ORAL_TABLET | Freq: Every evening | ORAL | Status: DC | PRN
Start: 2014-04-03 — End: 2014-04-04
  Administered 2014-04-03: 5 mg via ORAL
  Filled 2014-04-03: qty 1

## 2014-04-03 MED ORDER — ONDANSETRON HCL 4 MG/2ML IJ SOLN
4.0000 mg | Freq: Once | INTRAMUSCULAR | Status: AC
  Administered 2014-04-03: 4 mg via INTRAVENOUS
  Filled 2014-04-03: qty 2

## 2014-04-03 MED ORDER — MORPHINE SULFATE 4 MG/ML IJ SOLN
8.0000 mg | Freq: Once | INTRAMUSCULAR | Status: AC
  Administered 2014-04-03: 8 mg via INTRAVENOUS
  Filled 2014-04-03: qty 2

## 2014-04-03 MED ORDER — FUROSEMIDE 80 MG PO TABS
80.0000 mg | ORAL_TABLET | Freq: Every day | ORAL | Status: DC
  Administered 2014-04-04: 80 mg via ORAL
  Filled 2014-04-03: qty 1

## 2014-04-03 MED ORDER — NICOTINE 14 MG/24HR TD PT24
14.0000 mg | MEDICATED_PATCH | Freq: Every day | TRANSDERMAL | Status: DC
  Administered 2014-04-03 – 2014-04-04 (×2): 14 mg via TRANSDERMAL
  Filled 2014-04-03 (×2): qty 1

## 2014-04-03 MED ORDER — TRAMADOL HCL 50 MG PO TABS
50.0000 mg | ORAL_TABLET | Freq: Two times a day (BID) | ORAL | Status: DC | PRN
  Administered 2014-04-03: 50 mg via ORAL
  Filled 2014-04-03: qty 1

## 2014-04-03 MED ORDER — AMLODIPINE BESYLATE 10 MG PO TABS
10.0000 mg | ORAL_TABLET | Freq: Every day | ORAL | Status: DC
  Administered 2014-04-03 – 2014-04-04 (×2): 10 mg via ORAL
  Filled 2014-04-03 (×2): qty 1

## 2014-04-03 MED ORDER — PNEUMOCOCCAL VAC POLYVALENT 25 MCG/0.5ML IJ INJ
0.5000 mL | INJECTION | Freq: Once | INTRAMUSCULAR | Status: AC
  Administered 2014-04-03: 0.5 mL via INTRAMUSCULAR
  Filled 2014-04-03: qty 0.5

## 2014-04-03 MED ORDER — ALPRAZOLAM 0.25 MG PO TABS
0.2500 mg | ORAL_TABLET | Freq: Two times a day (BID) | ORAL | Status: DC | PRN
  Administered 2014-04-03: 0.25 mg via ORAL
  Filled 2014-04-03: qty 1

## 2014-04-03 MED ORDER — ASPIRIN 81 MG PO CHEW
324.0000 mg | CHEWABLE_TABLET | Freq: Once | ORAL | Status: AC
Start: 2014-04-03 — End: 2014-04-03
  Administered 2014-04-03: 324 mg via ORAL
  Filled 2014-04-03: qty 4

## 2014-04-03 MED ORDER — DARIFENACIN HYDROBROMIDE ER 7.5 MG PO TB24
7.5000 mg | ORAL_TABLET | Freq: Every day | ORAL | Status: DC
  Administered 2014-04-03 – 2014-04-04 (×2): 7.5 mg via ORAL
  Filled 2014-04-03 (×2): qty 1

## 2014-04-03 MED ORDER — ONDANSETRON HCL 4 MG/2ML IJ SOLN: 4.0000 mg | Freq: Four times a day (QID) | INTRAMUSCULAR | Status: DC | PRN

## 2014-04-03 MED ORDER — ARIPIPRAZOLE 10 MG PO TABS
20.0000 mg | ORAL_TABLET | Freq: Every day | ORAL | Status: DC
  Administered 2014-04-03: 20 mg via ORAL
  Filled 2014-04-03 (×2): qty 2

## 2014-04-03 MED ORDER — IOHEXOL 350 MG/ML SOLN
100.0000 mL | Freq: Once | INTRAVENOUS | Status: AC | PRN
  Administered 2014-04-03: 100 mL via INTRAVENOUS

## 2014-04-03 MED ORDER — GABAPENTIN 300 MG PO CAPS
300.0000 mg | ORAL_CAPSULE | Freq: Two times a day (BID) | ORAL | Status: DC
  Administered 2014-04-03 – 2014-04-04 (×2): 300 mg via ORAL
  Filled 2014-04-03 (×3): qty 1

## 2014-04-03 MED ORDER — RIVAROXABAN 20 MG PO TABS
20.0000 mg | ORAL_TABLET | Freq: Every day | ORAL | Status: DC
  Administered 2014-04-03: 20 mg via ORAL
  Filled 2014-04-03 (×2): qty 1

## 2014-04-03 MED ORDER — ACETAMINOPHEN 325 MG PO TABS: 650.0000 mg | ORAL_TABLET | ORAL | Status: DC | PRN

## 2014-04-03 MED ORDER — PNEUMOCOCCAL VAC POLYVALENT 25 MCG/0.5ML IJ INJ
0.5000 mL | INJECTION | INTRAMUSCULAR | Status: DC
Start: 2014-04-04 — End: 2014-04-03
  Filled 2014-04-03: qty 0.5

## 2014-04-03 MED ORDER — ALBUTEROL SULFATE (2.5 MG/3ML) 0.083% IN NEBU: 2.5000 mg | INHALATION_SOLUTION | Freq: Four times a day (QID) | RESPIRATORY_TRACT | Status: DC | PRN

## 2014-04-03 MED ORDER — PANTOPRAZOLE SODIUM 40 MG PO TBEC
40.0000 mg | DELAYED_RELEASE_TABLET | Freq: Every day | ORAL | Status: DC
  Administered 2014-04-03 – 2014-04-04 (×2): 40 mg via ORAL
  Filled 2014-04-03: qty 1

## 2014-04-03 NOTE — Progress Notes (Signed)
  CARE MANAGEMENT ED NOTE 04/03/2014  Patient:  Lori Bean, Lori Bean   Account Number:  [id number]  Date Initiated:  04/03/2014  Documentation initiated by:  Jackelyn Poling  Subjective/Objective Assessment:   43 yr old self pay [place] pt c/o mid chest pain that started this morning. Pt denies pain radiating. Pt c/o SHOB, dizziness but denies n/v.  Pt also states that she has DVT in left leg. Pt lost insurance and cant afford the     Subjective/Objective Assessment Detail:   anticoagulants she is supposed to be taking. Pt states that she is out of all her medications for 2 weeks.  pcp Momeyer Jegede     Action/Plan:   Reviewed EPIC notes including [facility] notes Spoke with pt, entered in d/c instructions her scheduled appt at [facility], encouraged her to go to [facility] for refills, samples and PASS assistance after speaking with [facility] pharmacy   Action/Plan Detail:   Anticipated DC Date:  04/03/2014     Status Recommendation to Physician:   Result of Recommendation:    Other ED [place]  Other  PCP issues  Outpatient Services - Pt will follow up    Choice offered to / List presented to:            Status of service:  Completed, signed off  ED Comments:   ED Comments Detail:  04/03/14 1557 ED CM call [facility] pharmacy and spoke with Arianna who states the pt had not been receiving medications from [facility] but can start receiving medication refills and assist with the medications they have in stock there after she comes in for her visit and they review her medications. ED CM updated the pt about going to [facility] & speaking with pharmacy to establish medication services with them for the medications they have in stock plus refills and they will continue also to assist her with PASS process  Follow-up With Details Comments Contact Info Angelica Chessman On 04/05/2014 Please follow up with family doctor services at [place] [facility]. You  can go to the [facility] to pick up samples You have a scheduled [facility] nurse visit on Thursday 04/05/14 You can ALWAYS go to the [facility] Walk in clinic Monday-Thursday 9-10:30 am - [address] [place] [id number] [phone]

## 2014-04-03 NOTE — ED Notes (Signed)
Bed: WA24 Expected date:  Expected time:  Means of arrival:  Comments: EMS 

## 2014-04-03 NOTE — ED Provider Notes (Signed)
CSN: [id number]     Arrival date & time 04/03/14  1434 History   First MD Initiated Contact with Patient 04/03/14 1534     Chief Complaint  Patient presents with  . Heat Exposure  . Medication Refill  . Chest Pain  . Back Pain  . Leg Pain     (Consider location/radiation/quality/duration/timing/severity/associated sxs/prior Treatment) HPI 43 year old female presents with multiple complaints. Her primary complaint is chest pain that started this morning at rest. When she went outside and walked around she had more pain and shortness of breath. This did somewhat improve with rest. However she still having a 6/10 sharp midsternal chest pain. Patient states is like that time but found that she had congestive heart failure. She is also recently diagnosed with a left leg DVT but due to insurance reasons has been off of her Xarelto for last 2 weeks. She's also been feeling worsening left leg pain today. This is going all the way to her posterior thigh. She recently has begun seeing the [facility] and should be getting sample soon for a lot of her medicines.  Past Medical History  Diagnosis Date  . Asthma   . Hypertension   . Anxiety   . Snoring disorder     Pt stated my boyfriend always wakes me up and tells me to breathe  . Heart murmur   . CHF (congestive heart failure)   . Shortness of breath   . Bronchitis     hx of  . Bipolar affective disorder     Schizophrenia  . Depression   . GERD (gastroesophageal reflux disease)   . Arthritis   . Overactive bladder   . Schizophrenia   . Sciatica   . Anginal pain     occ from asthma   Past Surgical History  Procedure Laterality Date  . Rotator cuff repair      Right shoulder  . Tubal ligation    . Gallstones reomved    . Lumbar laminectomy/decompression microdiscectomy  04/01/2012    Procedure: LUMBAR LAMINECTOMY/DECOMPRESSION MICRODISCECTOMY 2 LEVELS;  Surgeon: Faythe Ghee, MD;  Location: [facility];   Service: Neurosurgery;  Laterality: Left;  Lumbar four-five, lumbar five sacral one microdiscectomy   . Back surgery    . Lumbar wound debridement  04/29/2012    Procedure: LUMBAR WOUND DEBRIDEMENT;  Surgeon: Faythe Ghee, MD;  Location: [place] NEUR[facility] ORS;  Service: Neurosurgery;  Laterality: N/A;  lumbar wound debridement  . Cholecystectomy    . Eye surgery      Metal plate in right eye   Family History  Problem Relation Age of Onset  . Diabetes Mother   . Diabetes Father   . Heart disease Paternal Aunt   . Cancer Paternal Aunt    History  Substance Use Topics  . Smoking status: Current Every Day Smoker -- 0.25 packs/day for 9 years    Types: Cigarettes  . Smokeless tobacco: Never Used  . Alcohol Use: Yes     Comment: One 40 oz. beer/day average--some tequilla   OB History   Grav Para Term Preterm Abortions TAB SAB Ect Mult Living                 Review of Systems  Constitutional: Positive for diaphoresis. Negative for fever.  Respiratory: Positive for shortness of breath.   Cardiovascular: Positive for chest pain and leg swelling (left).  Gastrointestinal: Negative for abdominal pain.  Musculoskeletal:  Left leg pain  All other systems reviewed and are negative.     Allergies  Aspirin  Home Medications   Prior to Admission medications   Medication Sig Start Date End Date Taking? Authorizing Provider  albuterol (PROVENTIL HFA;VENTOLIN HFA) 108 (90 BASE) MCG/ACT inhaler Inhale 2 puffs into the lungs every 6 (six) hours as needed for wheezing or shortness of breath. 02/12/14  Yes Lance Bosch, NP  amLODipine (NORVASC) 10 MG tablet Take 1 tablet (10 mg total) by mouth daily. 02/12/14  Yes Lance Bosch, NP  ARIPiprazole (ABILIFY) 20 MG tablet Take 20 mg by mouth at bedtime.   Yes Historical Provider, MD  esomeprazole (NEXIUM) 40 MG capsule Take 40 mg by mouth daily at 12 noon.   Yes Historical Provider, MD  furosemide (LASIX) 80 MG tablet Take 1 tablet (80 mg total)  by mouth daily. 02/21/14  Yes Renella Cunas, MD  gabapentin (NEURONTIN) 300 MG capsule Take 1 capsule (300 mg total) by mouth 2 (two) times daily. 02/12/14  Yes Lance Bosch, NP  meloxicam (MOBIC) 15 MG tablet Take 1 tablet (15 mg total) by mouth daily. 02/23/14  Yes Kaitlyn Szekalski, PA-C  omeprazole (PRILOSEC) 20 MG capsule Take 1 capsule (20 mg total) by mouth daily. 02/12/14  Yes Lance Bosch, NP  oxyCODONE-acetaminophen (PERCOCET) 10-325 MG per tablet Take 1-2 tablets by mouth every 4 (four) hours as needed for pain. 01/02/14  Yes Charlie Pitter, MD  predniSONE (DELTASONE) 10 MG tablet Take 2 tablets (20 mg total) by mouth daily. 11/12/13  Yes Tiffany Marilu Favre, PA-C  rivaroxaban (XARELTO) 20 MG TABS tablet Take 1 tablet (20 mg total) by mouth daily with supper. 02/12/14  Yes Lance Bosch, NP  solifenacin (VESICARE) 5 MG tablet Take 1 tablet (5 mg total) by mouth daily. 02/21/14  Yes Lance Bosch, NP  traMADol (ULTRAM) 50 MG tablet Take 1 tablet (50 mg total) by mouth 2 (two) times daily as needed. 02/12/14  Yes Lance Bosch, NP  triamcinolone cream (KENALOG) 0.1 % Apply 1 application topically 2 (two) times daily as needed (eczema). Do not apply to face 02/21/14  Yes Lance Bosch, NP   BP 131/86  Pulse 79  Temp(Src) 98.5 F (36.9 C) (Oral)  Resp 21  SpO2 100%  LMP 03/18/2014 Physical Exam  Nursing note and vitals reviewed. Constitutional: She is oriented to person, place, and time. She appears well-developed and well-nourished.  obese  HENT:  Head: Normocephalic and atraumatic.  Right Ear: External ear normal.  Left Ear: External ear normal.  Nose: Nose normal.  Eyes: Right eye exhibits no discharge. Left eye exhibits no discharge.  Cardiovascular: Normal rate, regular rhythm and normal heart sounds.   Pulmonary/Chest: Effort normal and breath sounds normal. She exhibits tenderness (midline).  Abdominal: Soft. She exhibits no distension. There is no tenderness.  Musculoskeletal: She  exhibits no edema.       Left upper leg: She exhibits tenderness (posterior).       Left lower leg: She exhibits tenderness.  No tenderness to right leg. Difficult to appreciate any swelling due to obesity  Neurological: She is alert and oriented to person, place, and time.  Skin: Skin is warm and dry.    ED Course  Procedures (including critical care time) Labs Review Labs Reviewed  CBC - Abnormal; Notable for the following:    WBC 11.0 (*)    All other components within normal limits  PRO B  NATRIURETIC PEPTIDE - Abnormal; Notable for the following:    Pro B Natriuretic peptide (BNP) 137.6 (*)    All other components within normal limits  BASIC METABOLIC PANEL  Randolm Idol, ED    Imaging Review Dg Chest Port 1 View  04/03/2014   CLINICAL DATA:  Shortness of breath and central chest pain today  EXAM: PORTABLE CHEST - 1 VIEW  COMPARISON:  11/12/2013  FINDINGS: Mild cardiac enlargement stable. Vascular pattern normal. Lungs clear.  IMPRESSION: No active disease.   Electronically Signed   By: Skipper Cliche M.D.   On: 04/03/2014 15:15     EKG Interpretation   Date/Time:  Tuesday April 03 2014 14:35:43 EDT Ventricular Rate:  83 PR Interval:  169 QRS Duration: 80 QT Interval:  479 QTC Calculation: 563 R Axis:   31 Text Interpretation:  Sinus rhythm Anteroseptal infarct, old Artifact  inferior leads Otherwise no significant change 12/14 Confirmed by Baneza Bartoszek   MD, Denzal Meir ([id number]) on 04/03/2014 3:37:11 PM      MDM   Final diagnoses:  Chest pain, unspecified chest pain type    Patient has no DVT or PE. She does however have exertional chest pain shortness of breath, although is comforted by being out in the heat. She does is similar to when she was diagnosed with CHF. There is no echo in our system to see how bad her CHF is. No known actual coronary disease. However due to this and her risk factors she will need an observation admission for ACS rule out.    Ephraim Hamburger, MD 04/03/14 [phone]

## 2014-04-03 NOTE — ED Notes (Addendum)
Pt c/o mid chest pain that started this morning. Pt denies pain radiating. Pt c/o SHOB, dizziness but denies n/v.  Pt also states that she has DVT in left leg. Pt lost insurance and cant afford the anticoagulants she is supposed to be taking. Pt states that she is out of all her medications for 2 weeks.

## 2014-04-03 NOTE — ED Notes (Addendum)
Per EMS pt was outside walking today without any water. Pt coming in for heat exhaustion. Pt states she has been out of her meds.

## 2014-04-03 NOTE — ED Notes (Signed)
Patient transported to CT 

## 2014-04-03 NOTE — ED Notes (Signed)
Pt returned from CT °

## 2014-04-03 NOTE — Progress Notes (Signed)
Left lower extremity venous duplex completed.  Left:  No evidence of DVT, superficial thrombosis, or Baker's cyst.  Right:  Negative for DVT in the common femoral vein.  

## 2014-04-03 NOTE — H&P (Signed)
Lori Bean is an 43 y.o. female.   Chief Complaint: Chest pain HPI: Pt is a 43 yr old woman who states that she has been off of her medications for her multiple medical problems for the last month due to problems with insurance.  The patient states that she has been extremely fatigued over the past month for this reason.  She also has not been taking Xarelto which she was prescribed for DVT.  She complained of increased pain in her lower extremities as well.  The patient states that she was very active today trying to get her medication issue resolved.  She states that she was walking and she felt very short of breath and dizzy.  She states that she had a sharp pain in the center of her chest.  The pain was 8/10 in severity.  It lasted about an hour and did not radiate.  She had diaphoresis, but not nausea.  She states that the pain was exertional and improved with rest.  She states that the pain went away entirely once she got into the ambulance and "calmed down".  She states that she has never had this problem before.  Past Medical History  Diagnosis Date  . Asthma   . Hypertension   . Anxiety   . Snoring disorder     Pt stated my boyfriend always wakes me up and tells me to breathe  . Heart murmur   . CHF (congestive heart failure)   . Shortness of breath   . Bronchitis     hx of  . Bipolar affective disorder     Schizophrenia  . Depression   . GERD (gastroesophageal reflux disease)   . Arthritis   . Overactive bladder   . Schizophrenia   . Sciatica   . Anginal pain     occ from asthma    Past Surgical History  Procedure Laterality Date  . Rotator cuff repair      Right shoulder  . Tubal ligation    . Gallstones reomved    . Lumbar laminectomy/decompression microdiscectomy  04/01/2012    Procedure: LUMBAR LAMINECTOMY/DECOMPRESSION MICRODISCECTOMY 2 LEVELS;  Surgeon: Faythe Ghee, MD;  Location: [facility];  Service: Neurosurgery;  Laterality: Left;  Lumbar  four-five, lumbar five sacral one microdiscectomy   . Lumbar wound debridement  04/29/2012    Procedure: LUMBAR WOUND DEBRIDEMENT;  Surgeon: Faythe Ghee, MD;  Location: [place] NEUR[facility] ORS;  Service: Neurosurgery;  Laterality: N/A;  lumbar wound debridement  . Cholecystectomy    . Eye surgery      Metal plate in right eye  . Back surgery      3 back surgeries    Family History  Problem Relation Age of Onset  . Diabetes Mother   . Diabetes Father   . Heart disease Paternal Aunt   . Cancer Paternal Aunt    Social History:  reports that she has been smoking Cigarettes.  She has a 2.25 pack-year smoking history. She has never used smokeless tobacco. She reports that she does not drink alcohol or use illicit drugs.  Allergies:  Allergies  Allergen Reactions  . Aspirin Nausea And Vomiting    'It makes me throw up."     (Not in a hospital admission)  Results for orders placed during the hospital encounter of 04/03/14 (from the past 48 hour(s))  CBC     Status: Abnormal   Collection Time    04/03/14  2:55 PM  Result Value Ref Range   WBC 11.0 (*) 4.0 - 10.5 K/uL   RBC 4.01  3.87 - 5.11 MIL/uL   Hemoglobin 12.2  12.0 - 15.0 g/dL   HCT 67.5  91.6 - 38.4 %   MCV 90.0  78.0 - 100.0 fL   MCH 30.4  26.0 - 34.0 pg   MCHC 33.8  30.0 - 36.0 g/dL   RDW 66.5  99.3 - 57.0 %   Platelets 319  150 - 400 K/uL  BASIC METABOLIC PANEL     Status: Abnormal   Collection Time    04/03/14  2:55 PM      Result Value Ref Range   Sodium 141  137 - 147 mEq/L   Potassium 4.0  3.7 - 5.3 mEq/L   Chloride 106  96 - 112 mEq/L   CO2 25  19 - 32 mEq/L   Glucose, Bld 129 (*) 70 - 99 mg/dL   BUN 12  6 - 23 mg/dL   Creatinine, Ser 1.77  0.50 - 1.10 mg/dL   Calcium 9.5  8.4 - 93.9 mg/dL   GFR calc non Af Amer 81 (*) >90 mL/min   GFR calc Af Amer >90  >90 mL/min   Comment: (NOTE)     The eGFR has been calculated using the CKD EPI equation.     This calculation has not been validated in all clinical  situations.     eGFR's persistently <90 mL/min signify possible Chronic Kidney     Disease.   Anion gap 10  5 - 15  PRO B NATRIURETIC PEPTIDE     Status: Abnormal   Collection Time    04/03/14  3:02 PM      Result Value Ref Range   Pro B Natriuretic peptide (BNP) 137.6 (*) 0 - 125 pg/mL  I-STAT TROPOININ, ED     Status: None   Collection Time    04/03/14  3:16 PM      Result Value Ref Range   Troponin i, poc 0.00  0.00 - 0.08 ng/mL   Comment 3            Comment: Due to the release kinetics of cTnI,     a negative result within the first hours     of the onset of symptoms does not rule out     myocardial infarction with certainty.     If myocardial infarction is still suspected,     repeat the test at appropriate intervals.   Ct Angio Chest W/cm &/or Wo Cm  04/03/2014   CLINICAL DATA:  Mid chest pain with shortness of breath and dizziness. Patient reports left lower extremity DVT.  EXAM: CT ANGIOGRAPHY CHEST WITH CONTRAST  TECHNIQUE: Multidetector CT imaging of the chest was performed using the standard protocol during bolus administration of intravenous contrast. Multiplanar CT image reconstructions and MIPs were obtained to evaluate the vascular anatomy.  CONTRAST:  OMNIPAQUE IOHEXOL 350 MG/ML SOLN  COMPARISON:  Chest CT 05/08/2012.  FINDINGS: Vascular: The pulmonary arteries are satisfactorily but not optimally opacified with contrast. There is no evidence of acute pulmonary embolism. No significant atherosclerosis is demonstrated.  Mediastinum: Mildly prominent axillary lymph nodes bilaterally are stable. There are no pathologically enlarged mediastinal or hilar lymph nodes. The thyroid gland, trachea and esophagus appear stable. There is a small hiatal hernia. The heart size is normal.  Lungs/Pleura: There is no pleural or pericardial effusion.The overall aeration of the lung bases is improved with mild  residual left lower lobe linear atelectasis or scarring. Tiny right upper lobe  nodular densities on images 20 and 32 are stable. There is no suspicious nodule or confluent airspace opacity.  Upper abdomen: Stable. Bilateral adrenal hyperplasia appears unchanged.  Musculoskeletal/Chest wall: No chest wall lesion or acute osseous findings.  Review of the MIP images confirms the above findings.  IMPRESSION: 1. No evidence of acute pulmonary embolism or other acute chest process. 2. No significant atherosclerosis. 3. Interval improved aeration of the lung bases. 4. Stable adrenal hyperplasia.   Electronically Signed   By: Camie Patience M.D.   On: 04/03/2014 18:44   Dg Chest Port 1 View  04/03/2014   CLINICAL DATA:  Shortness of breath and central chest pain today  EXAM: PORTABLE CHEST - 1 VIEW  COMPARISON:  11/12/2013  FINDINGS: Mild cardiac enlargement stable. Vascular pattern normal. Lungs clear.  IMPRESSION: No active disease.   Electronically Signed   By: Skipper Cliche M.D.   On: 04/03/2014 15:15    Review of Systems  Constitutional: Positive for malaise/fatigue and diaphoresis. Negative for fever and chills.  HENT: Positive for congestion. Negative for sore throat.   Eyes: Negative for blurred vision, double vision and discharge.  Respiratory: Positive for shortness of breath. Negative for cough, hemoptysis, sputum production, wheezing and stridor.   Cardiovascular: Positive for chest pain and leg swelling. Negative for palpitations, orthopnea, claudication and PND.  Gastrointestinal: Negative for heartburn, nausea and vomiting.  Genitourinary: Negative for dysuria and hematuria.  Musculoskeletal: Positive for back pain. Negative for myalgias.  Skin: Negative for itching and rash.  Neurological: Positive for dizziness and weakness. Negative for tingling, tremors, sensory change, speech change, focal weakness, seizures, loss of consciousness and headaches.  Endo/Heme/Allergies: Negative for environmental allergies and polydipsia. Does not bruise/bleed easily.   Psychiatric/Behavioral: Positive for depression. The patient is nervous/anxious.     Blood pressure 154/104, pulse 62, temperature 98 F (36.7 C), temperature source Oral, resp. rate 17, last menstrual period 03/18/2014, SpO2 99.00%. Physical Exam  Constitutional: She is oriented to person, place, and time. Vital signs are normal. She is active and cooperative.  Non-toxic appearance. She does not have a sickly appearance. She does not appear ill. She appears distressed.  HENT:  Head: Normocephalic and atraumatic.  Mouth/Throat: No oropharyngeal exudate.  Eyes: Conjunctivae and EOM are normal. Pupils are equal, round, and reactive to light. Right eye exhibits no discharge. Left eye exhibits no discharge. No scleral icterus.  Neck: Normal range of motion. No JVD present. No tracheal deviation present. No thyromegaly present.  Cardiovascular: Normal rate, regular rhythm, normal heart sounds and intact distal pulses.  Exam reveals no gallop and no friction rub.   No murmur heard. Respiratory: Effort normal and breath sounds normal. No respiratory distress. She has no wheezes. She exhibits no tenderness.  GI: Soft. Bowel sounds are normal. She exhibits no distension and no mass. There is no tenderness. There is no rebound and no guarding.  Musculoskeletal: She exhibits edema.  Lymphadenopathy:    She has no cervical adenopathy.  Neurological: She is alert and oriented to person, place, and time. She has normal reflexes. She displays normal reflexes. No cranial nerve deficit. She exhibits normal muscle tone. Coordination normal.  Skin: Skin is warm and dry.  Psychiatric: She has a normal mood and affect. Her behavior is normal. Judgment and thought content normal.     Assessment/Plan 1. Chest pain: DDX: 1) ACS - pattern is exertional and pt does have  significant risk factors for ACS.                                  2.) Pulmonary embolus. This is a concern as the patient has been off of Xarelto  for her DVT for a month, but CTA of the chest is negative for PE and she is also negative for                                       DVT.                                  3.)Panic attack: quite possible as this patient has significant psychiatric issues which have not been addressed while she has been off of her regular meds. The patient will be admitted to a telemetry bed and ruled out for MI by EKG and enzyme criteria.  She will undergo a nuclear medicine stress test in the am.  She will be continued on her home medications as possible and anxiolytics will be made available to her. 2. Hypertension - continue home medications 3. Bipolar Disorder - continue home medications 4. Nicotine abuse disorder. Pt was given 7-10 minutes of smoking cessation counseling and a nicotine patch will be made available to her. Birdena Kingma 04/03/2014, 8:00 PM

## 2014-04-03 NOTE — ED Notes (Signed)
Pt returned from CT. CT stated couldn't complete exam bc IV not properly functioning. CT tech stated they looked for new IV access and not able to find anything.

## 2014-04-04 DIAGNOSIS — F411 Generalized anxiety disorder: Secondary | ICD-10-CM

## 2014-04-04 DIAGNOSIS — I10 Essential (primary) hypertension: Secondary | ICD-10-CM

## 2014-04-04 LAB — HEMOGLOBIN A1C
Hgb A1c MFr Bld: 6 % — ABNORMAL HIGH (ref <5.7)
Mean Plasma Glucose: 126 mg/dL — ABNORMAL HIGH (ref <117)

## 2014-04-04 LAB — TROPONIN I
Troponin I: <0.3 ng/mL (ref <0.30)
Troponin I: <0.3 ng/mL (ref <0.30)

## 2014-04-04 MED ORDER — RIVAROXABAN 20 MG PO TABS: 20.0000 mg | ORAL_TABLET | Freq: Every day | ORAL | Status: DC

## 2014-04-04 NOTE — Progress Notes (Signed)
Discharge to home, ambulatory no complaints of any chest pains or discomfort upon discharge. D/c instructions and follow up appointments done and given to the patient,verbalized understanding.  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 PIV site no dressing, no redness noted  On insertion site upon discharge.

## 2014-04-04 NOTE — Care Management Note (Signed)
    Page 1 of 1   04/04/2014     1:04:28 PM CARE MANAGEMENT NOTE 04/04/2014  Patient:  Lori Bean, Lori Bean   Account Number:  [id number]  Date Initiated:  04/04/2014  Documentation initiated by:  Dessa Phi  Subjective/Objective Assessment:   43 Y/O F ADMITTED W/CHEST PAIN.     Action/Plan:   FROM HOME.PCP-[facility].   Anticipated DC Date:  04/04/2014   Anticipated DC Plan:  HOME/SELF CARE  In-house referral  Financial Counselor      DC Planning Services  CM consult  Medication Assistance      Choice offered to / List presented to:             Status of service:  Completed, signed off Medicare Important Message given?   (If response is "NO", the following Medicare IM given date fields will be blank) Date Medicare IM given:   Medicare IM given by:   Date Additional Medicare IM given:   Additional Medicare IM given by:    Discharge Disposition:  HOME/SELF CARE  Per UR Regulation:  Reviewed for med. necessity/level of care/duration of stay  If discussed at [place] of Stay Meetings, dates discussed:    Comments:  04/04/14 Lori Ivins RN,BSN NCM [phone] PATIENT IS MEDICAID POTENTIAL PER FINANCIAL COUNSELOR.ED CM HAS ALREADY PROVIDED [place] INFO.I HAVE SPOKEN TO [facility],INFORMED THAT PATIENT WILL COME THERE FOR SCRIPTS TO BE FILLED-XARELTO.PATIENT INFORMED TO GO TO [place] @ D/C TO GET SCRIPTS FILLED.PATIENT VOICED UNDERSTANDING.NURSE UPDATED.NO FURTHER D/C NEEDS.PCP-[facility].

## 2014-04-04 NOTE — Discharge Summary (Addendum)
Physician Discharge Summary  Lori Bean SWF:[id number] DOB: 06-02-1971 DOA: 04/03/2014  PCP: Angelica Chessman, MD  Admit date: 04/03/2014 Discharge date: 04/04/2014  Time spent: >35 minutes  Recommendations for Outpatient Follow-up:  F/u with PCP in 2-3 days  Discharge Diagnoses:  Principal Problem:   Chest pain on exertion Active Problems:   Anxiety   GERD (gastroesophageal reflux disease)   Tobacco abuse disorder   Obesity (BMI 30-39.9)   Chest pain   Discharge Condition: stable   Diet recommendation: lwo sodium   Filed Weights   04/03/14 2039  Weight: 89.8 kg (197 lb 15.6 oz)    History of present illness:  43 yr old woman with PMH of HTN, GERD, anxiety, schizophrenia  presented with chest pain; no exertional chest pain, but some DOE;  -Pt states that she has been off of her medications for her multiple medical problems for the last month due to problems with insurance.   Hospital Course:  1. Chest pain, atypical; chest pain resolved  -ECG no new changes, old infarct; troponin's neg; CT chest neg for PE:  -Chest pain resolved, no new symptoms; cont PPI for suspected GERD; recommended to f/u with PCP in 2-3 days to scheduled outpatient stress test  2. Hypertension - continue home medications  3. Bipolar Disorder - continue home medications  4. Nicotine abuse disorder. Stop smoking  5. Recent h/o DVT, on xarelto;   reemphasized medication compliance, and f/u with PCP    Procedures:  none (i.e. Studies not automatically included, echos, thoracentesis, etc; not x-rays)  Consultations:  none  Discharge Exam: Filed Vitals:   04/04/14 0416  BP: 137/83  Pulse: 76  Temp: 97.8 F (36.6 C)  Resp: 18    General: alert Cardiovascular: s1,s2 rrr Respiratory: CTA BL  Discharge Instructions  Discharge Instructions   Diet - low sodium heart healthy    Complete by:  As directed      Discharge instructions    Complete by:  As directed   Please follow  up with primary care doctor in 2-3 days to schedule outpatient stress test     Increase activity slowly    Complete by:  As directed             Medication List    STOP taking these medications       meloxicam 15 MG tablet  Commonly known as:  MOBIC      TAKE these medications       albuterol 108 (90 BASE) MCG/ACT inhaler  Commonly known as:  PROVENTIL HFA;VENTOLIN HFA  Inhale 2 puffs into the lungs every 6 (six) hours as needed for wheezing or shortness of breath.     amLODipine 10 MG tablet  Commonly known as:  NORVASC  Take 1 tablet (10 mg total) by mouth daily.     ARIPiprazole 20 MG tablet  Commonly known as:  ABILIFY  Take 20 mg by mouth at bedtime.     esomeprazole 40 MG capsule  Commonly known as:  NEXIUM  Take 40 mg by mouth daily at 12 noon.     furosemide 80 MG tablet  Commonly known as:  LASIX  Take 1 tablet (80 mg total) by mouth daily.     gabapentin 300 MG capsule  Commonly known as:  NEURONTIN  Take 1 capsule (300 mg total) by mouth 2 (two) times daily.     omeprazole 20 MG capsule  Commonly known as:  PRILOSEC  Take 1 capsule (20 mg  total) by mouth daily.     oxyCODONE-acetaminophen 10-325 MG per tablet  Commonly known as:  PERCOCET  Take 1-2 tablets by mouth every 4 (four) hours as needed for pain.     predniSONE 10 MG tablet  Commonly known as:  DELTASONE  Take 2 tablets (20 mg total) by mouth daily.     rivaroxaban 20 MG Tabs tablet  Commonly known as:  XARELTO  Take 1 tablet (20 mg total) by mouth daily with supper.     solifenacin 5 MG tablet  Commonly known as:  VESICARE  Take 1 tablet (5 mg total) by mouth daily.     traMADol 50 MG tablet  Commonly known as:  ULTRAM  Take 1 tablet (50 mg total) by mouth 2 (two) times daily as needed.     triamcinolone cream 0.1 %  Commonly known as:  KENALOG  Apply 1 application topically 2 (two) times daily as needed (eczema). Do not apply to face       Allergies  Allergen Reactions  .  Aspirin Nausea And Vomiting    'It makes me throw up."       Follow-up Information   Follow up with Angelica Chessman, MD On 04/05/2014. (Please follow up with family doctor services at [place] and wellness. You can go to the [facility] to pick up samples You have a scheduled [facility] nurse visit on Thursday 04/05/14  You can ALWAYS go to the [facility] Walk in clinic Monday-Thursday 9-10:30 am -)    Specialty:  Internal Medicine   Contact information:   [place] [address] [phone]        The results of significant diagnostics from this hospitalization (including imaging, microbiology, ancillary and laboratory) are listed below for reference.    Significant Diagnostic Studies: Ct Angio Chest W/cm &/or Wo Cm  04/03/2014   CLINICAL DATA:  Mid chest pain with shortness of breath and dizziness. Patient reports left lower extremity DVT.  EXAM: CT ANGIOGRAPHY CHEST WITH CONTRAST  TECHNIQUE: Multidetector CT imaging of the chest was performed using the standard protocol during bolus administration of intravenous contrast. Multiplanar CT image reconstructions and MIPs were obtained to evaluate the vascular anatomy.  CONTRAST:  168mL OMNIPAQUE IOHEXOL 350 MG/ML SOLN  COMPARISON:  Chest CT 05/08/2012.  FINDINGS: Vascular: The pulmonary arteries are satisfactorily but not optimally opacified with contrast. There is no evidence of acute pulmonary embolism. No significant atherosclerosis is demonstrated.  Mediastinum: Mildly prominent axillary lymph nodes bilaterally are stable. There are no pathologically enlarged mediastinal or hilar lymph nodes. The thyroid gland, trachea and esophagus appear stable. There is a small hiatal hernia. The heart size is normal.  Lungs/Pleura: There is no pleural or pericardial effusion.The overall aeration of the lung bases is improved with mild residual left lower lobe linear atelectasis or scarring. Tiny right upper lobe nodular densities on images 20 and 32 are  stable. There is no suspicious nodule or confluent airspace opacity.  Upper abdomen: Stable. Bilateral adrenal hyperplasia appears unchanged.  Musculoskeletal/Chest wall: No chest wall lesion or acute osseous findings.  Review of the MIP images confirms the above findings.  IMPRESSION: 1. No evidence of acute pulmonary embolism or other acute chest process. 2. No significant atherosclerosis. 3. Interval improved aeration of the lung bases. 4. Stable adrenal hyperplasia.   Electronically Signed   By: Camie Patience M.D.   On: 04/03/2014 18:44   Ct Maxillofacial W/cm  03/18/2014   CLINICAL DATA:  Facial  swelling.  EXAM: CT MAXILLOFACIAL WITH CONTRAST  TECHNIQUE: Multidetector CT imaging of the maxillofacial structures was performed with intravenous contrast. Multiplanar CT image reconstructions were also generated. A small metallic BB was placed on the right temple in order to reliably differentiate right from left.  CONTRAST:  69mL OMNIPAQUE IOHEXOL 300 MG/ML  SOLN  COMPARISON:  None.  FINDINGS: There is edema within the right cheek, centered in the buccal space and throughout the masticator compartment. The muscles of mastication are enlarged as a result. There is likely trismus as the right TMJ is located and the left is anteriorly subluxed. The cause is usually odontogenic, although no periapical erosion or definite cavity is identified. There is no visible stone within the parotid duct, as permitted by dental amalgam; no dilation of the duct or primary inflammation in the parotid gland. No primary inflammation in the floor of mouth or within the submandibular glands. No evidence of tonsillitis or sinusitis. The major vessels in the neck are patent. Orbits are negative. Clear paranasal sinuses.  IMPRESSION: Edema in the right buccal and masticator spaces which is likely infection. The source is not identified, but based on location odontogenic origin is favored. No abscess.   Electronically Signed   By: Jorje Guild M.D.   On: 03/18/2014 06:24   Dg Chest Port 1 View  04/03/2014   CLINICAL DATA:  Shortness of breath and central chest pain today  EXAM: PORTABLE CHEST - 1 VIEW  COMPARISON:  11/12/2013  FINDINGS: Mild cardiac enlargement stable. Vascular pattern normal. Lungs clear.  IMPRESSION: No active disease.   Electronically Signed   By: Skipper Cliche M.D.   On: 04/03/2014 15:15    Microbiology: No results found for this or any previous visit (from the past 240 hour(s)).   Labs: Basic Metabolic Panel:  Recent Labs Lab 04/03/14 1455  NA 141  K 4.0  CL 106  CO2 25  GLUCOSE 129*  BUN 12  CREATININE 0.86  CALCIUM 9.5   Liver Function Tests: No results found for this basename: AST, ALT, ALKPHOS, BILITOT, PROT, ALBUMIN,  in the last 168 hours No results found for this basename: LIPASE, AMYLASE,  in the last 168 hours No results found for this basename: AMMONIA,  in the last 168 hours CBC:  Recent Labs Lab 04/03/14 1455  WBC 11.0*  HGB 12.2  HCT 36.1  MCV 90.0  PLT 319   Cardiac Enzymes:  Recent Labs Lab 04/03/14 2115 04/04/14 0203 04/04/14 0915  TROPONINI <0.30 <0.30 <0.30   BNP: BNP (last 3 results)  Recent Labs  08/24/13 1427 11/12/13 1220 04/03/14 1502  PROBNP 60.6 18.8 137.6*   CBG: No results found for this basename: GLUCAP,  in the last 168 hours     Signed:  Rowe Clack N  [facility] 04/04/2014, 1:01 PM

## 2014-04-09 ENCOUNTER — Ambulatory Visit: Payer: No Typology Code available for payment source | Attending: Internal Medicine | Admitting: *Deleted

## 2014-04-09 DIAGNOSIS — I82409 Acute embolism and thrombosis of unspecified deep veins of unspecified lower extremity: Secondary | ICD-10-CM

## 2014-04-09 NOTE — Progress Notes (Signed)
Patient ID: Lori Bean, female   DOB: Mar 05, 1971, 43 y.o.   MRN: [id number] Patient presents today for help with financial help for Xarelto. Patient given application for PASS program, and IRS information that needs to be filled out. Explained to patient she can do the free 30 day trial, fill out application and Tax forms because this will take 30+ days for a decision to be made. Informed patient that if no decision has been made once 30 day trial is over then [facility] pharmacy can give her samples. Patient and her sister verbalized understanding in the plan of care. Vivia Birmingham, RN

## 2014-04-16 ENCOUNTER — Other Ambulatory Visit (HOSPITAL_COMMUNITY)
Admission: RE | Admit: 2014-04-16 | Discharge: 2014-04-16 | Disposition: A | Discharge status: Home / Self Care | Payer: No Typology Code available for payment source | Source: Ambulatory Visit | Attending: Internal Medicine | Admitting: Internal Medicine

## 2014-04-16 ENCOUNTER — Telehealth: Payer: Self-pay | Admitting: *Deleted

## 2014-04-16 ENCOUNTER — Ambulatory Visit: Payer: Medicaid Other

## 2014-04-16 ENCOUNTER — Ambulatory Visit (HOSPITAL_BASED_OUTPATIENT_CLINIC_OR_DEPARTMENT_OTHER): Payer: Medicaid Other | Admitting: Internal Medicine

## 2014-04-16 ENCOUNTER — Encounter: Payer: Self-pay | Admitting: Internal Medicine

## 2014-04-16 ENCOUNTER — Other Ambulatory Visit (HOSPITAL_COMMUNITY): Admission: RE | Admit: 2014-04-16 | Payer: Medicaid Other | Source: Ambulatory Visit

## 2014-04-16 ENCOUNTER — Ambulatory Visit: Payer: No Typology Code available for payment source | Attending: Internal Medicine

## 2014-04-16 VITALS — BP 127/78 | HR 81 | Temp 98.2°F | Resp 22 | Ht 62.0 in | Wt 221.6 lb

## 2014-04-16 DIAGNOSIS — M545 Low back pain, unspecified: Secondary | ICD-10-CM

## 2014-04-16 DIAGNOSIS — N318 Other neuromuscular dysfunction of bladder: Secondary | ICD-10-CM | POA: Insufficient documentation

## 2014-04-16 DIAGNOSIS — N760 Acute vaginitis: Secondary | ICD-10-CM

## 2014-04-16 DIAGNOSIS — K219 Gastro-esophageal reflux disease without esophagitis: Secondary | ICD-10-CM | POA: Insufficient documentation

## 2014-04-16 DIAGNOSIS — F17200 Nicotine dependence, unspecified, uncomplicated: Secondary | ICD-10-CM | POA: Diagnosis not present

## 2014-04-16 DIAGNOSIS — I10 Essential (primary) hypertension: Secondary | ICD-10-CM | POA: Insufficient documentation

## 2014-04-16 DIAGNOSIS — R300 Dysuria: Secondary | ICD-10-CM | POA: Insufficient documentation

## 2014-04-16 DIAGNOSIS — I509 Heart failure, unspecified: Secondary | ICD-10-CM | POA: Insufficient documentation

## 2014-04-16 DIAGNOSIS — Z01419 Encounter for gynecological examination (general) (routine) without abnormal findings: Secondary | ICD-10-CM | POA: Insufficient documentation

## 2014-04-16 DIAGNOSIS — J45909 Unspecified asthma, uncomplicated: Secondary | ICD-10-CM

## 2014-04-16 DIAGNOSIS — F209 Schizophrenia, unspecified: Secondary | ICD-10-CM | POA: Insufficient documentation

## 2014-04-16 DIAGNOSIS — F319 Bipolar disorder, unspecified: Secondary | ICD-10-CM | POA: Insufficient documentation

## 2014-04-16 LAB — POCT URINALYSIS DIPSTICK
Bilirubin, UA: NEGATIVE
Blood, UA: NEGATIVE
Glucose, UA: NEGATIVE
Ketones, UA: NEGATIVE
Leukocytes, UA: NEGATIVE
Nitrite, UA: NEGATIVE
Protein, UA: NEGATIVE
Spec Grav, UA: 1.025
Urobilinogen, UA: 1
pH, UA: 6.5

## 2014-04-16 MED ORDER — AMLODIPINE BESYLATE 10 MG PO TABS: 10.0000 mg | ORAL_TABLET | Freq: Every day | ORAL | Status: DC

## 2014-04-16 MED ORDER — GABAPENTIN 300 MG PO CAPS: 300.0000 mg | ORAL_CAPSULE | Freq: Two times a day (BID) | ORAL | Status: DC

## 2014-04-16 MED ORDER — OMEPRAZOLE 20 MG PO CPDR: 20.0000 mg | DELAYED_RELEASE_CAPSULE | Freq: Every day | ORAL | Status: DC

## 2014-04-16 MED ORDER — METRONIDAZOLE 500 MG PO TABS: 500.0000 mg | ORAL_TABLET | Freq: Two times a day (BID) | ORAL | Status: DC

## 2014-04-16 MED ORDER — ALBUTEROL SULFATE HFA 108 (90 BASE) MCG/ACT IN AERS: 2.0000 | INHALATION_SPRAY | Freq: Four times a day (QID) | RESPIRATORY_TRACT | Status: DC | PRN

## 2014-04-16 MED ORDER — FLUCONAZOLE 150 MG PO TABS: 150.0000 mg | ORAL_TABLET | Freq: Once | ORAL | Status: DC

## 2014-04-16 NOTE — Progress Notes (Signed)
Patient presents for one week history of vaginal itching and burning with urination C/O low back pain at surgery site; Rates 10/10 at present States ran out of all meds one month ago when her insurance ran out. States it's been over a year since last pap.  States she has had abnormal paps in past.

## 2014-04-16 NOTE — Patient Instructions (Signed)
Smoking Cessation Quitting smoking is important to your health and has many advantages. However, it is not always easy to quit since nicotine is a very addictive drug. Oftentimes, people try 3 times or more before being able to quit. This document explains the best ways for you to prepare to quit smoking. Quitting takes hard work and a lot of effort, but you can do it. ADVANTAGES OF QUITTING SMOKING  You will live longer, feel better, and live better.  Your body will feel the impact of quitting smoking almost immediately.  Within 20 minutes, blood pressure decreases. Your pulse returns to its normal level.  After 8 hours, carbon monoxide levels in the blood return to normal. Your oxygen level increases.  After 24 hours, the chance of having a heart attack starts to decrease. Your breath, hair, and body stop smelling like smoke.  After 48 hours, damaged nerve endings begin to recover. Your sense of taste and smell improve.  After 72 hours, the body is virtually free of nicotine. Your bronchial tubes relax and breathing becomes easier.  After 2 to 12 weeks, lungs can hold more air. Exercise becomes easier and circulation improves.  The risk of having a heart attack, stroke, cancer, or lung disease is greatly reduced.  After 1 year, the risk of coronary heart disease is cut in half.  After 5 years, the risk of stroke falls to the same as a nonsmoker.  After 10 years, the risk of lung cancer is cut in half and the risk of other cancers decreases significantly.  After 15 years, the risk of coronary heart disease drops, usually to the level of a nonsmoker.  If you are pregnant, quitting smoking will improve your chances of having a healthy baby.  The people you live with, especially any children, will be healthier.  You will have extra money to spend on things other than cigarettes. QUESTIONS TO THINK ABOUT BEFORE ATTEMPTING TO QUIT You may want to talk about your answers with your  health care provider.  Why do you want to quit?  If you tried to quit in the past, what helped and what did not?  What will be the most difficult situations for you after you quit? How will you plan to handle them?  Who can help you through the tough times? Your family? Friends? A health care provider?  What pleasures do you get from smoking? What ways can you still get pleasure if you quit? Here are some questions to ask your health care provider:  How can you help me to be successful at quitting?  What medicine do you think would be best for me and how should I take it?  What should I do if I need more help?  What is smoking withdrawal like? How can I get information on withdrawal? GET READY  Set a quit date.  Change your environment by getting rid of all cigarettes, ashtrays, matches, and lighters in your home, car, or work. Do not let people smoke in your home.  Review your past attempts to quit. Think about what worked and what did not. GET SUPPORT AND ENCOURAGEMENT You have a better chance of being successful if you have help. You can get support in many ways.  Tell your family, friends, and coworkers that you are going to quit and need their support. Ask them not to smoke around you.  Get individual, group, or telephone counseling and support. Programs are available at local hospitals and health centers. Call   your local health department for information about programs in your area.  Spiritual beliefs and practices may help some smokers quit.  Download a "quit meter" on your computer to keep track of quit statistics, such as how long you have gone without smoking, cigarettes not smoked, and money saved.  Get a self-help book about quitting smoking and staying off tobacco. LEARN NEW SKILLS AND BEHAVIORS  Distract yourself from urges to smoke. Talk to someone, go for a walk, or occupy your time with a task.  Change your normal routine. Take a different route to work.  Drink tea instead of coffee. Eat breakfast in a different place.  Reduce your stress. Take a hot bath, exercise, or read a book.  Plan something enjoyable to do every day. Reward yourself for not smoking.  Explore interactive web-based programs that specialize in helping you quit. GET MEDICINE AND USE IT CORRECTLY Medicines can help you stop smoking and decrease the urge to smoke. Combining medicine with the above behavioral methods and support can greatly increase your chances of successfully quitting smoking.  Nicotine replacement therapy helps deliver nicotine to your body without the negative effects and risks of smoking. Nicotine replacement therapy includes nicotine gum, lozenges, inhalers, nasal sprays, and skin patches. Some may be available over-the-counter and others require a prescription.  Antidepressant medicine helps people abstain from smoking, but how this works is unknown. This medicine is available by prescription.  Nicotinic receptor partial agonist medicine simulates the effect of nicotine in your brain. This medicine is available by prescription. Ask your health care provider for advice about which medicines to use and how to use them based on your health history. Your health care provider will tell you what side effects to look out for if you choose to be on a medicine or therapy. Carefully read the information on the package. Do not use any other product containing nicotine while using a nicotine replacement product.  RELAPSE OR DIFFICULT SITUATIONS Most relapses occur within the first 3 months after quitting. Do not be discouraged if you start smoking again. Remember, most people try several times before finally quitting. You may have symptoms of withdrawal because your body is used to nicotine. You may crave cigarettes, be irritable, feel very hungry, cough often, get headaches, or have difficulty concentrating. The withdrawal symptoms are only temporary. They are strongest  when you first quit, but they will go away within 10-14 days. To reduce the chances of relapse, try to:  Avoid drinking alcohol. Drinking lowers your chances of successfully quitting.  Reduce the amount of caffeine you consume. Once you quit smoking, the amount of caffeine in your body increases and can give you symptoms, such as a rapid heartbeat, sweating, and anxiety.  Avoid smokers because they can make you want to smoke.  Do not let weight gain distract you. Many smokers will gain weight when they quit, usually less than 10 pounds. Eat a healthy diet and stay active. You can always lose the weight gained after you quit.  Find ways to improve your mood other than smoking. FOR MORE INFORMATION  www.smokefree.gov  Document Released: 08/25/2001 Document Revised: 01/15/2014 Document Reviewed: 12/10/2011 [employer] Patient Information 2015 [employer]. This information is not intended to replace advice given to you by your health care provider. Make sure you discuss any questions you have with your health care provider.  

## 2014-04-16 NOTE — Telephone Encounter (Signed)
Patient called stating she needed an order for a MRI. Patient states she has an appointment today with Roney Jaffe, NP and will discuss this with the provider then. Vivia Birmingham, RN

## 2014-04-16 NOTE — Progress Notes (Signed)
Patient ID: Lori Bean, female   DOB: [birth year], 43 y.o.   MRN: [id number]  CC: vaginal itching  HPI:  Patient presents today for vaginal itching for one week. Has fishy odor with whitish discharge.  Has burning with urination when urine touches the skin.  LMP was 7/28.   4/16 had fusion surgery of l4-6, now having pain and delayed healing of wound. Patient reports that her Neurologist has requested for [place] to send her for a MRI-lumbar spine w/wo contrast. Having sharp pain in lower back.  Denies bowel or bladder dysfunction.  Allergies  Allergen Reactions  . Aspirin Nausea And Vomiting    'It makes me throw up."   Past Medical History  Diagnosis Date  . Asthma   . Hypertension   . Anxiety   . Snoring disorder     Pt stated my boyfriend always wakes me up and tells me to breathe  . Heart murmur   . CHF (congestive heart failure)   . Shortness of breath   . Bronchitis     hx of  . Bipolar affective disorder     Schizophrenia  . Depression   . GERD (gastroesophageal reflux disease)   . Arthritis   . Overactive bladder   . Schizophrenia   . Sciatica   . Anginal pain     occ from asthma   Current Outpatient Prescriptions on File Prior to Visit  Medication Sig Dispense Refill  . albuterol (PROVENTIL HFA;VENTOLIN HFA) 108 (90 BASE) MCG/ACT inhaler Inhale 2 puffs into the lungs every 6 (six) hours as needed for wheezing or shortness of breath.  1 Inhaler  3  . amLODipine (NORVASC) 10 MG tablet Take 1 tablet (10 mg total) by mouth daily.  30 tablet  3  . ARIPiprazole (ABILIFY) 20 MG tablet Take 20 mg by mouth at bedtime.      [name] esomeprazole (NEXIUM) 40 MG capsule Take 40 mg by mouth daily at 12 noon.      . furosemide (LASIX) 80 MG tablet Take 1 tablet (80 mg total) by mouth daily.  30 tablet  3  . gabapentin (NEURONTIN) 300 MG capsule Take 1 capsule (300 mg total) by mouth 2 (two) times daily.  60 capsule  3  . omeprazole (PRILOSEC) 20 MG capsule Take 1 capsule (20 mg  total) by mouth daily.  30 capsule  3  . oxyCODONE-acetaminophen (PERCOCET) 10-325 MG per tablet Take 1-2 tablets by mouth every 4 (four) hours as needed for pain.  90 tablet  0  . predniSONE (DELTASONE) 10 MG tablet Take 2 tablets (20 mg total) by mouth daily.  21 tablet  0  . rivaroxaban (XARELTO) 20 MG TABS tablet Take 1 tablet (20 mg total) by mouth daily with supper.  30 tablet  3  . solifenacin (VESICARE) 5 MG tablet Take 1 tablet (5 mg total) by mouth daily.  30 tablet  1  . traMADol (ULTRAM) 50 MG tablet Take 1 tablet (50 mg total) by mouth 2 (two) times daily as needed.  60 tablet  0  . triamcinolone cream (KENALOG) 0.1 % Apply 1 application topically 2 (two) times daily as needed (eczema). Do not apply to face  30 g  2  . [DISCONTINUED] solifenacin (VESICARE) 5 MG tablet Take 5 mg by mouth daily.       No current facility-administered medications on file prior to visit.   Family History  Problem Relation Age of Onset  . Diabetes Mother   .  Diabetes Father   . Heart disease Paternal Aunt   . Cancer Paternal Aunt    History   Social History  . Marital Status: Divorced    Spouse Name: N/A    Number of Children: N/A  . Years of Education: N/A   Occupational History  . Not on file.   Social History Main Topics  . Smoking status: Current Every Day Smoker -- 0.25 packs/day for 9 years    Types: Cigarettes  . Smokeless tobacco: Never Used  . Alcohol Use: No     Comment: quit Nov. 2014  . Drug Use: No  . Sexual Activity: Not Currently   Other Topics Concern  . Not on file   Social History Narrative  . No narrative on file    Review of Systems  Respiratory: Negative.   Cardiovascular: Negative.   Genitourinary: Positive for dysuria. Negative for urgency and frequency.  Skin: Negative.      Objective:   Filed Vitals:   04/16/14 1620  BP: 127/78  Pulse: 81  Temp: 98.2 F (36.8 C)  Resp: 22    Physical Exam  Constitutional: She is oriented to person,  place, and time. Vital signs are normal. She appears well-developed and well-nourished.  HENT:  Head: Normocephalic.  Cardiovascular: Normal rate, regular rhythm, S1 normal, S2 normal and normal heart sounds.   Pulmonary/Chest: Effort normal and breath sounds normal.  Abdominal: Soft. Bowel sounds are normal.  Genitourinary: Uterus normal. Pelvic exam was performed with patient prone. No labial fusion. There is no rash, tenderness, lesion or injury on the right labia. There is no rash, tenderness, lesion or injury on the left labia. Cervix exhibits no motion tenderness, no discharge and no friability. Right adnexum displays no mass. Left adnexum displays no mass. No tenderness around the vagina. Vaginal discharge found.  White discharge with foul odor  Neurological: She is alert and oriented to person, place, and time.  Skin: Skin is warm, dry and intact.  Psychiatric: She has a normal mood and affect. Her speech is normal and behavior is normal. Judgment and thought content normal.   .  Lab Results  Component Value Date   WBC 11.0* 04/03/2014   HGB 12.2 04/03/2014   HCT 36.1 04/03/2014   MCV 90.0 04/03/2014   PLT 319 04/03/2014   Lab Results  Component Value Date   CREATININE 0.86 04/03/2014   BUN 12 04/03/2014   NA 141 04/03/2014   K 4.0 04/03/2014   CL 106 04/03/2014   CO2 25 04/03/2014    Lab Results  Component Value Date   HGBA1C 6.0* 04/03/2014   Lipid Panel     Component Value Date/Time   CHOL  Value: 152        ATP III CLASSIFICATION:  <200     mg/dL   Desirable  200-239  mg/dL   Borderline High  >=240    mg/dL   High        07/31/2009 0740   TRIG 51 07/31/2009 0740   HDL 62 07/31/2009 0740   CHOLHDL 2.5 07/31/2009 0740   VLDL 10 07/31/2009 0740   LDLCALC  Value: 80        Total Cholesterol/HDL:CHD Risk Coronary Heart Disease Risk Table                     Men   Women  1/2 Average Risk   3.4   3.3  Average Risk  5.0   4.4  2 X Average Risk   9.6   7.1  3 X Average Risk   23.4   11.0        Use the calculated Patient Ratio above and the CHD Risk Table to determine the patient's CHD Risk.        ATP III CLASSIFICATION (LDL):  <100     mg/dL   Optimal  100-129  mg/dL   Near or Above                    Optimal  130-159  mg/dL   Borderline  160-189  mg/dL   High  >190     mg/dL   Very High 07/31/2009 0740       Assessment and plan:   Lotus was seen today for vaginal itching.  Diagnoses and associated orders for this visit:  Vaginitis and vulvovaginitis - Cytology - PAP [place] - Cervicovaginal ancillary only - metroNIDAZOLE (FLAGYL) 500 MG tablet; Take 1 tablet (500 mg total) by mouth 2 (two) times daily. - fluconazole (DIFLUCAN) 150 MG tablet; Take 1 tablet (150 mg total) by mouth once.  Burning with urination - Urinalysis Dipstick  Essential hypertension - amLODipine (NORVASC) 10 MG tablet; Take 1 tablet (10 mg total) by mouth daily.  Low back pain, unspecified back pain laterality, with sciatica presence unspecified - gabapentin (NEURONTIN) 300 MG capsule; Take 1 capsule (300 mg total) by mouth 2 (two) times daily. - MR Lumbar Spine W Wo Contrast; Future  Unspecified asthma(493.90) - albuterol (PROVENTIL HFA;VENTOLIN HFA) 108 (90 BASE) MCG/ACT inhaler; Inhale 2 puffs into the lungs every 6 (six) hours as needed for wheezing or shortness of breath.  Gastroesophageal reflux disease, esophagitis presence not specified - omeprazole (PRILOSEC) 20 MG capsule; Take 1 capsule (20 mg total) by mouth daily.  Tobacco use disorder Smoking cessation discussed        Chari Manning, [facility] [phone] 04/16/2014, 5:18 PM

## 2014-04-17 ENCOUNTER — Telehealth: Payer: Self-pay | Admitting: *Deleted

## 2014-04-17 ENCOUNTER — Other Ambulatory Visit: Payer: Self-pay | Admitting: Internal Medicine

## 2014-04-17 DIAGNOSIS — I10 Essential (primary) hypertension: Secondary | ICD-10-CM

## 2014-04-17 MED ORDER — RIVAROXABAN 20 MG PO TABS: 20.0000 mg | ORAL_TABLET | Freq: Every day | ORAL | Status: DC

## 2014-04-17 NOTE — Telephone Encounter (Signed)
Patient notified of MRI appt scheduled for 05/07/14 at 3 pm at [facility]. Arrival time of 2:45 pm. Also, patient aware that she needs to have lab drawn prior to this appt. Lab visit scheduled for tomorrow at 9:30 am Order for [facility] with GFR placed per provider.

## 2014-04-18 ENCOUNTER — Ambulatory Visit: Payer: No Typology Code available for payment source | Attending: Internal Medicine

## 2014-04-18 DIAGNOSIS — I10 Essential (primary) hypertension: Secondary | ICD-10-CM

## 2014-04-18 LAB — COMPLETE METABOLIC PANEL WITH GFR
ALT: 12 U/L (ref 0–35)
AST: 6 U/L (ref 0–37)
Albumin: 3.5 g/dL (ref 3.5–5.2)
Alkaline Phosphatase: 62 U/L (ref 39–117)
BUN: 14 mg/dL (ref 6–23)
CO2: 28 mEq/L (ref 19–32)
Calcium: 9 mg/dL (ref 8.4–10.5)
Chloride: 103 mEq/L (ref 96–112)
Creat: 0.76 mg/dL (ref 0.50–1.10)
GFR, Est African American: >89 mL/min
GFR, Est Non African American: >89 mL/min
Glucose, Bld: 122 mg/dL — ABNORMAL HIGH (ref 70–99)
Potassium: 4.9 mEq/L (ref 3.5–5.3)
Sodium: 139 mEq/L (ref 135–145)
Total Bilirubin: 0.2 mg/dL (ref 0.2–1.2)
Total Protein: 6.1 g/dL (ref 6.0–8.3)

## 2014-04-18 LAB — CYTOLOGY - PAP

## 2014-04-19 ENCOUNTER — Other Ambulatory Visit: Payer: Self-pay

## 2014-04-24 ENCOUNTER — Telehealth: Payer: Self-pay | Admitting: Emergency Medicine

## 2014-04-24 NOTE — Telephone Encounter (Signed)
Message copied by Ricci Barker on Tue Apr 24, 2014  8:29 AM ------      Message from: Chari Manning A      Created: Fri Apr 20, 2014  6:03 PM       Patient Pap is negative for malignancies but positive for bacterial vaginosis. Make sure patient is taking the Flagyl prescribed on last visit ------

## 2014-04-25 ENCOUNTER — Ambulatory Visit (HOSPITAL_BASED_OUTPATIENT_CLINIC_OR_DEPARTMENT_OTHER): Payer: Medicaid Other | Attending: Internal Medicine

## 2014-04-27 IMAGING — CR DG CHEST 2V
2 series · 2 of 2 positions shown · non-contrast
Comparison: 04/16/2011 and 06/08/2011.

CLINICAL DATA: Preoperative respiratory evaluation for lumbar
laminectomy.

CHEST - 2 VIEW

[view not recorded (1 of 2)]
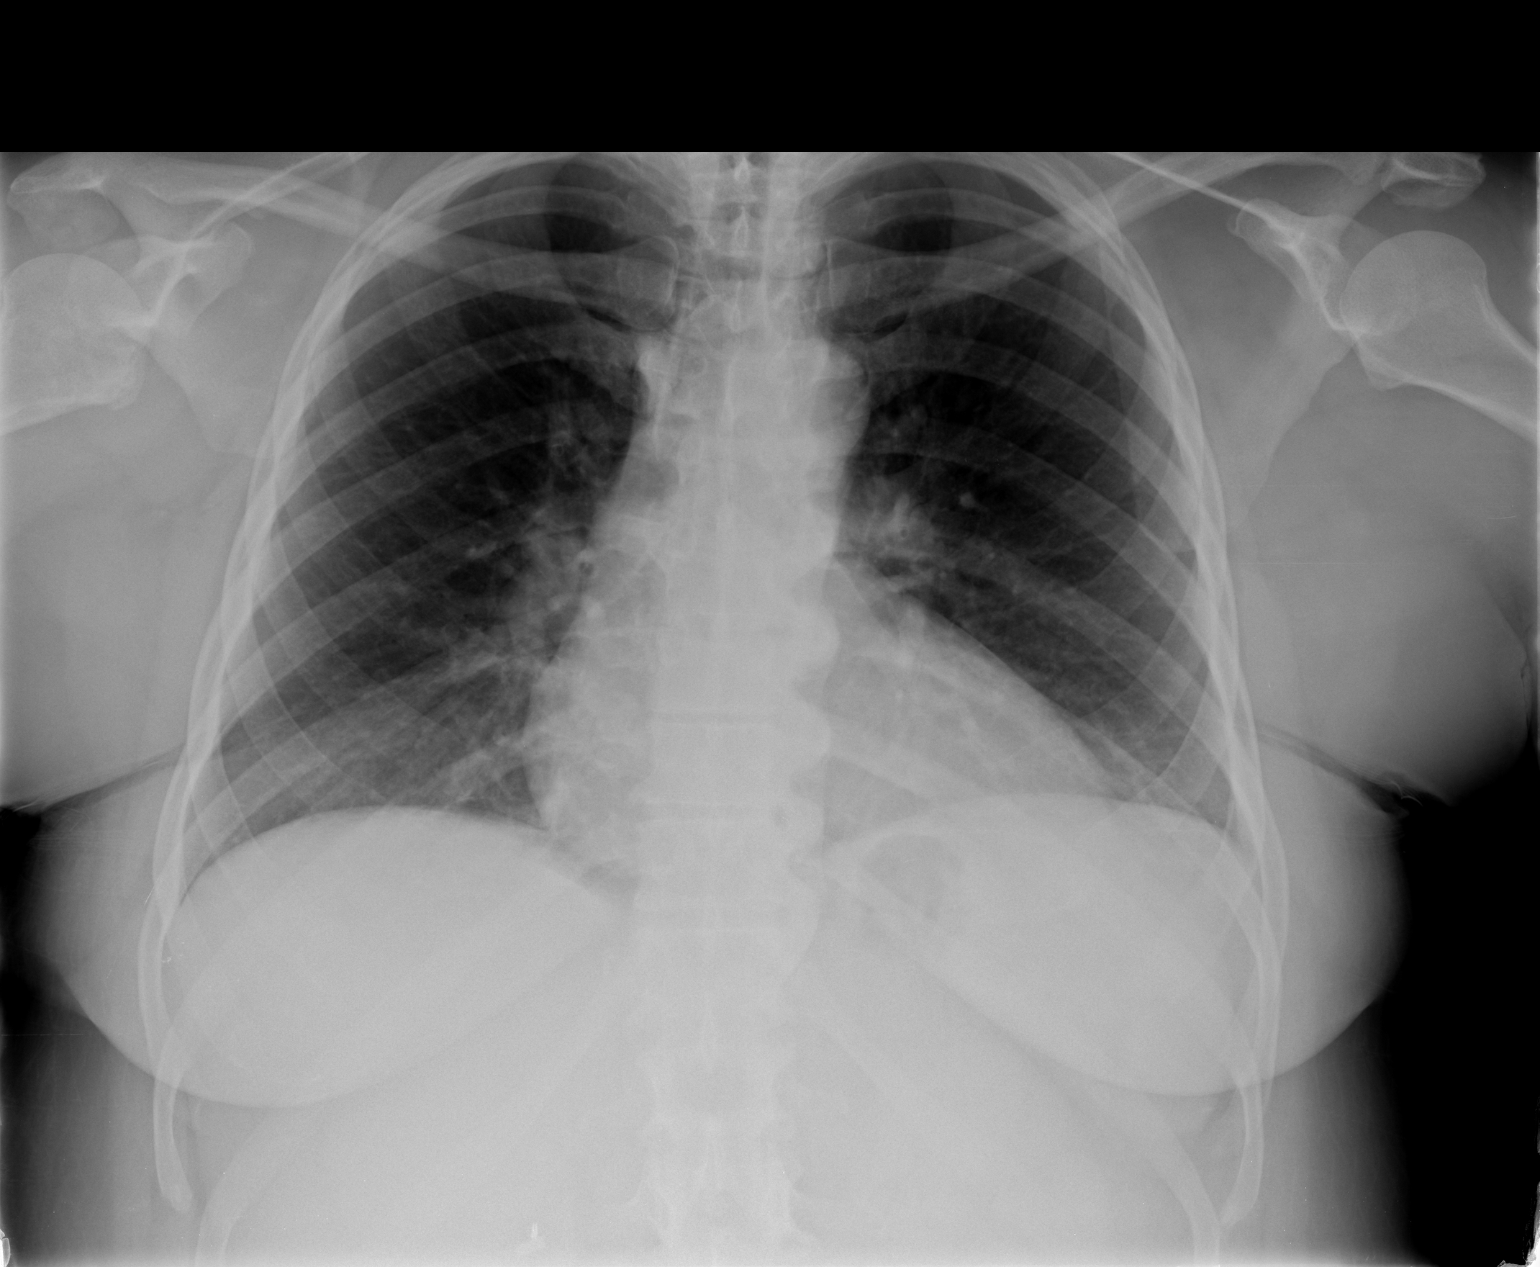

[view not recorded (2 of 2)]
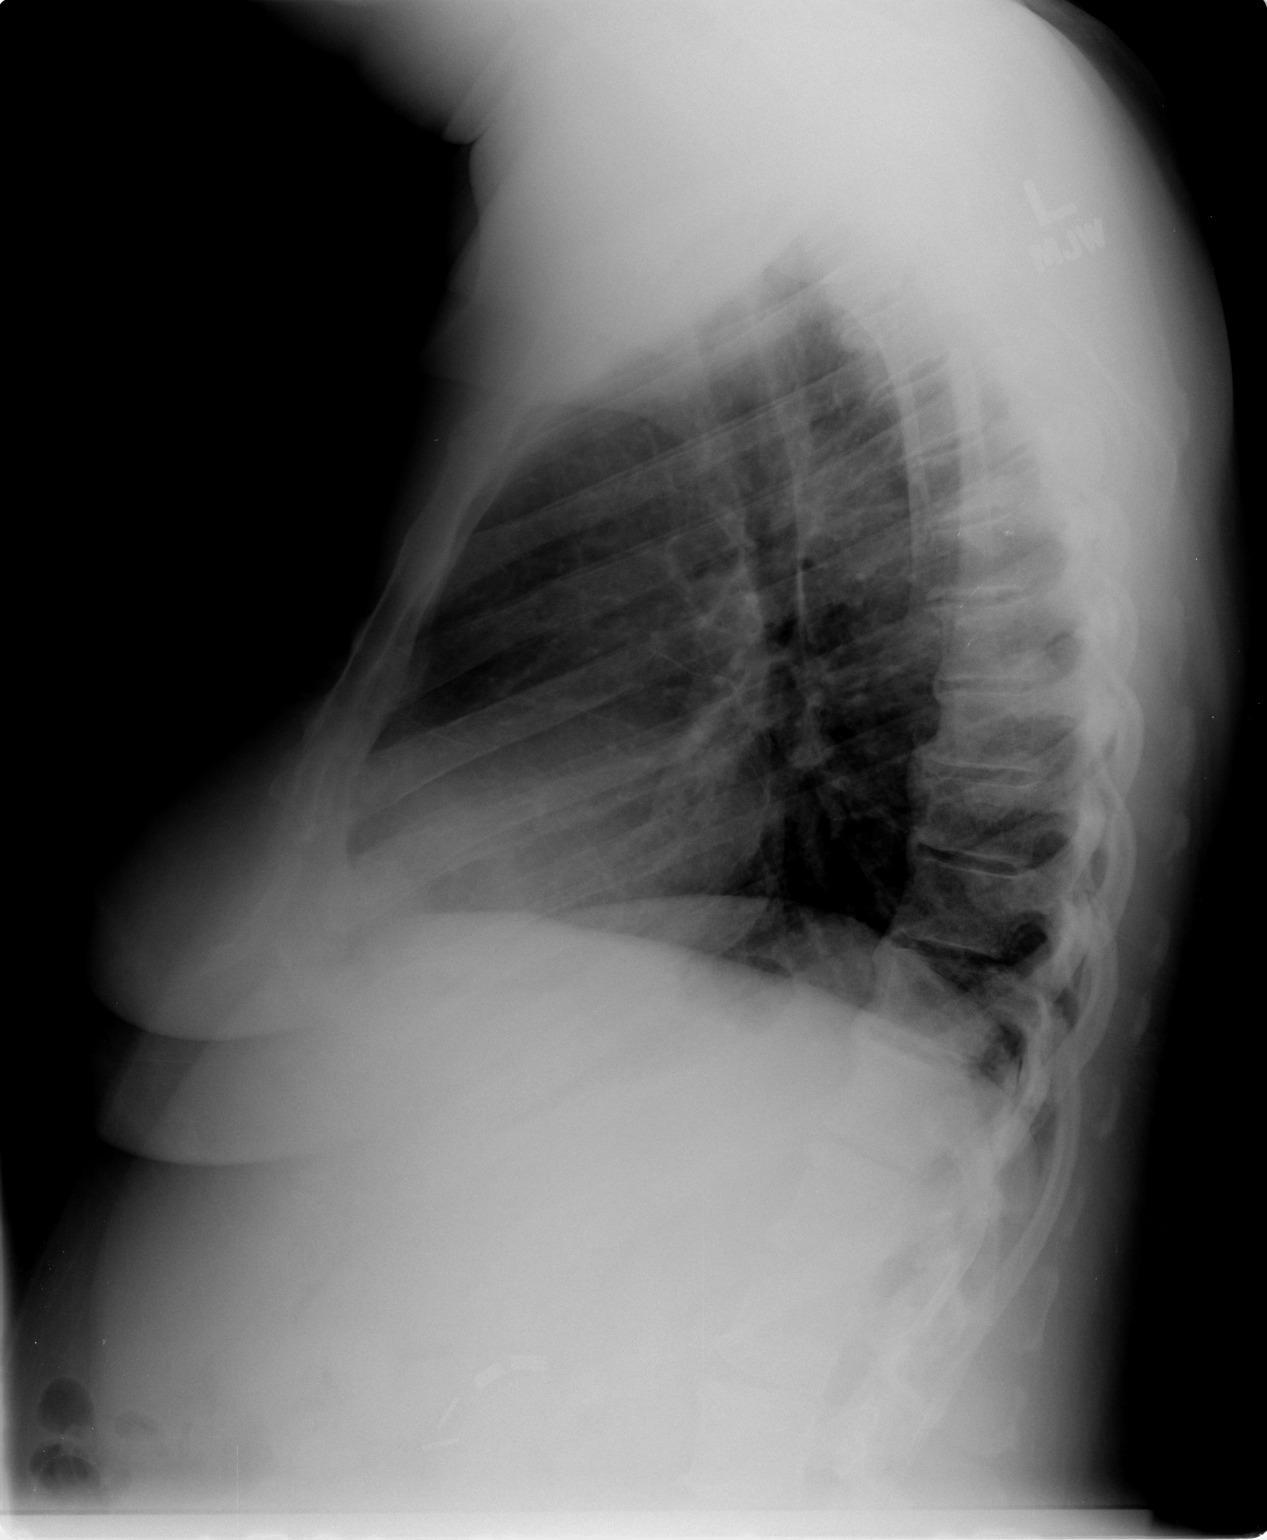

[2 of 2 positions shown; findings below may reference images not displayed]

FINDINGS: The lungs are clear without focal infiltrate, edema,
pneumothorax or pleural effusion.  No edema or focal airspace
consolidation.  A prominent vessel on end in the left perihilar
region is stable. Imaged bony structures of the thorax are intact.
IMPRESSION: Stable exam.  No acute findings.

## 2014-05-06 IMAGING — CR DG LUMBAR SPINE 2-3V
1 series · 1 of 1 positions shown · non-contrast
Comparison: MRI 01/20/1937

CLINICAL DATA: L4-5, L5-S1 microdiskectomy.

LUMBAR SPINE - 2-3 VIEW

[view not recorded]
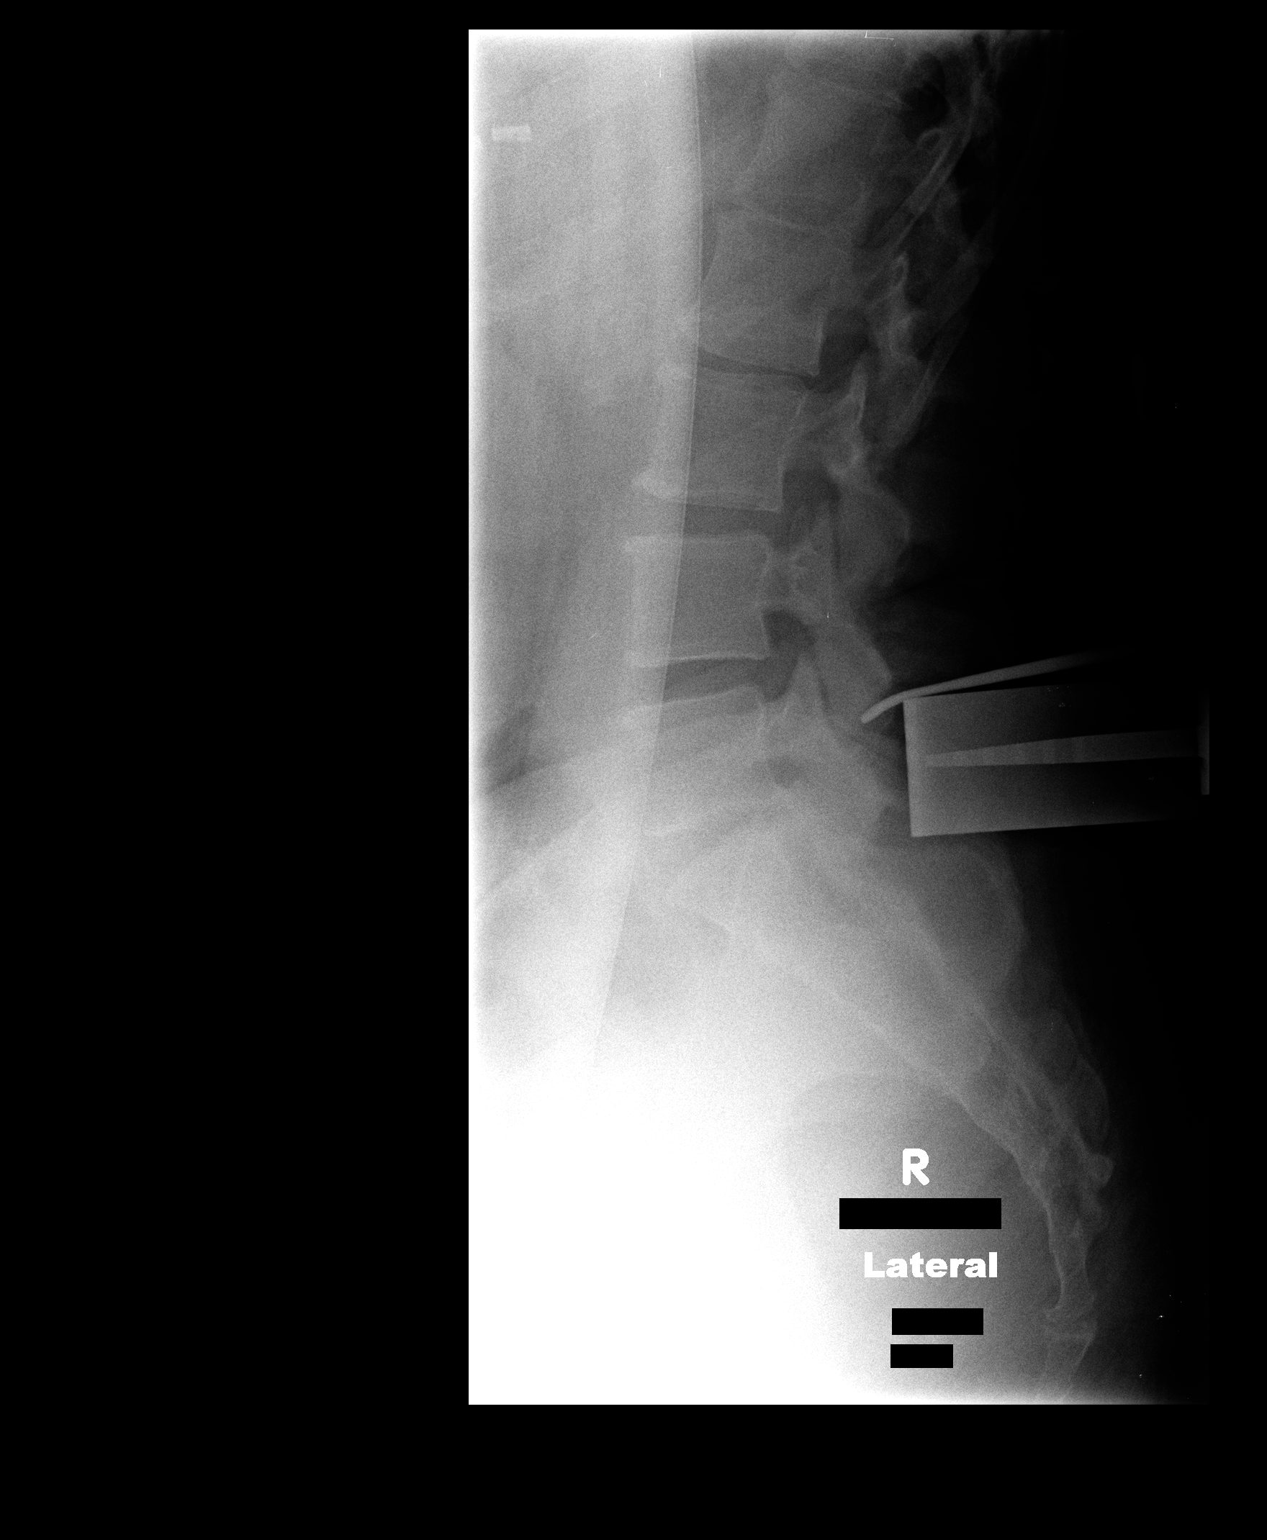

[1 of 1 positions shown; findings below may reference images not displayed]

FINDINGS: First lateral intraoperative image demonstrates posterior
surgical instruments directed at the S1 vertebral body.

Second lateral intraoperative image demonstrates posterior surgical
instruments directed at the L5-S1 level.
IMPRESSION: Intraoperative localization as above.

## 2014-05-07 ENCOUNTER — Ambulatory Visit (HOSPITAL_COMMUNITY)
Admission: RE | Admit: 2014-05-07 | Discharge: 2014-05-07 | Disposition: A | Discharge status: Home / Self Care | Payer: No Typology Code available for payment source | Source: Ambulatory Visit | Attending: Internal Medicine | Admitting: Internal Medicine

## 2014-05-07 DIAGNOSIS — M47817 Spondylosis without myelopathy or radiculopathy, lumbosacral region: Secondary | ICD-10-CM | POA: Insufficient documentation

## 2014-05-07 DIAGNOSIS — M545 Low back pain: Secondary | ICD-10-CM

## 2014-05-07 MED ORDER — GADOBENATE DIMEGLUMINE 529 MG/ML IV SOLN
20.0000 mL | Freq: Once | INTRAVENOUS | Status: AC | PRN
  Administered 2014-05-07: 20 mL via INTRAVENOUS

## 2014-05-09 ENCOUNTER — Telehealth: Payer: Self-pay | Admitting: *Deleted

## 2014-05-09 DIAGNOSIS — J45909 Unspecified asthma, uncomplicated: Secondary | ICD-10-CM

## 2014-05-09 NOTE — Telephone Encounter (Signed)
You may send order. 2 inhalations every 4-6 hrs as needed for wheezing. Thanks

## 2014-05-09 NOTE — Telephone Encounter (Signed)
Received a fax from [facility] stating they have a coupon for a $20 coupon for [employer]. The pharmacy would like to know if you would like to a prescription for this medication instead of Pro Air HFA which is $52.72 currently for the patient.

## 2014-05-10 MED ORDER — ALBUTEROL SULFATE 108 (90 BASE) MCG/ACT IN AEPB: 2.0000 | INHALATION_SPRAY | RESPIRATORY_TRACT | Status: DC | PRN

## 2014-05-10 NOTE — Telephone Encounter (Signed)
Called pharmacy to let them know it is ok to do this. Prescription put in per Roney Jaffe, NP to reflect the change.

## 2014-06-12 IMAGING — CR DG CHEST 2V
3 series · 3 of 3 positions shown · non-contrast
Comparison: Plain films of the chest 03/23/2012.

CLINICAL DATA: Chest pain.

CHEST - 2 VIEW

[w chest lat (1 of 2)]
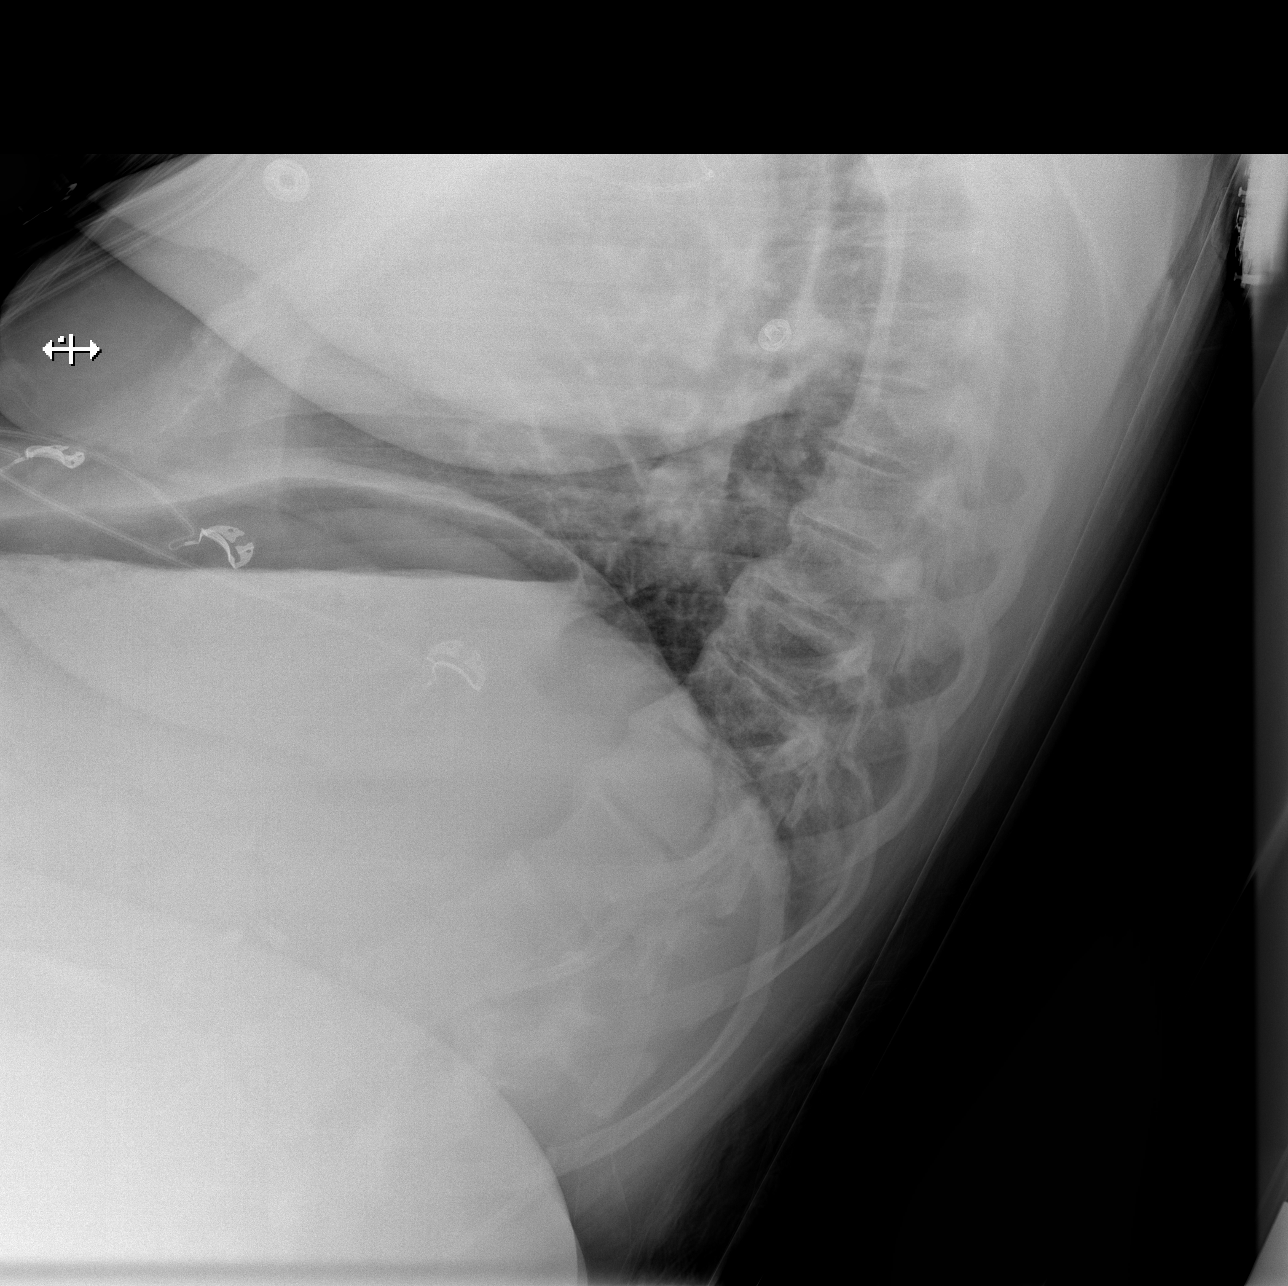

[w chest lat (2 of 2)]
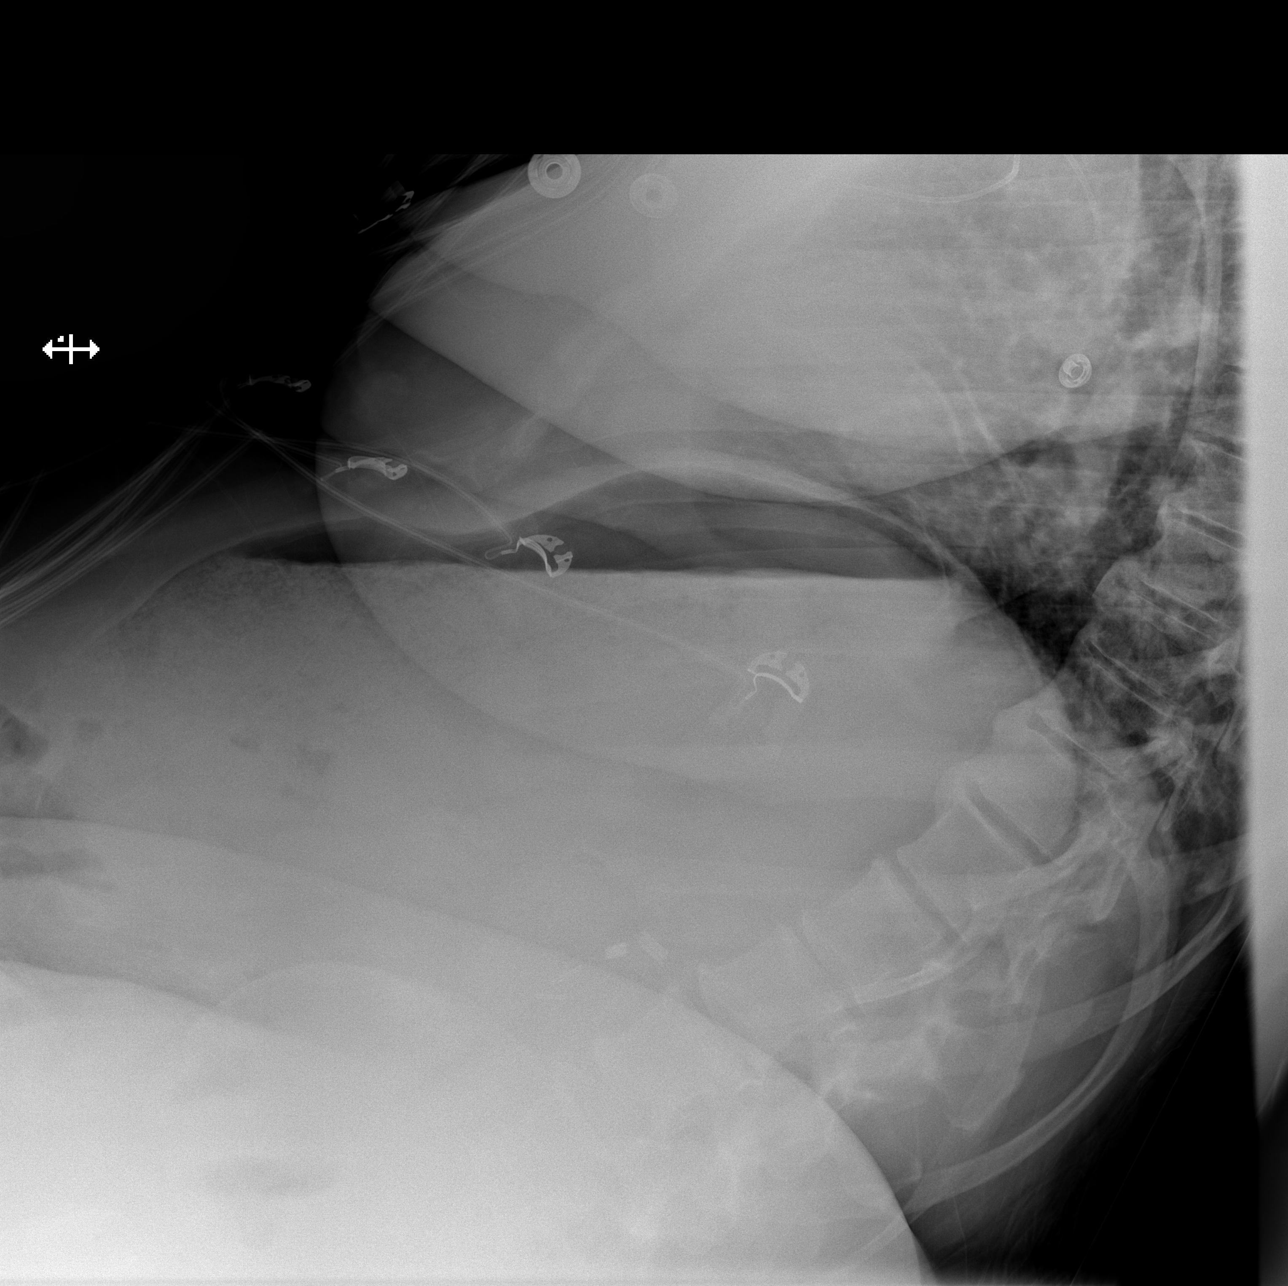

[x chest ap]
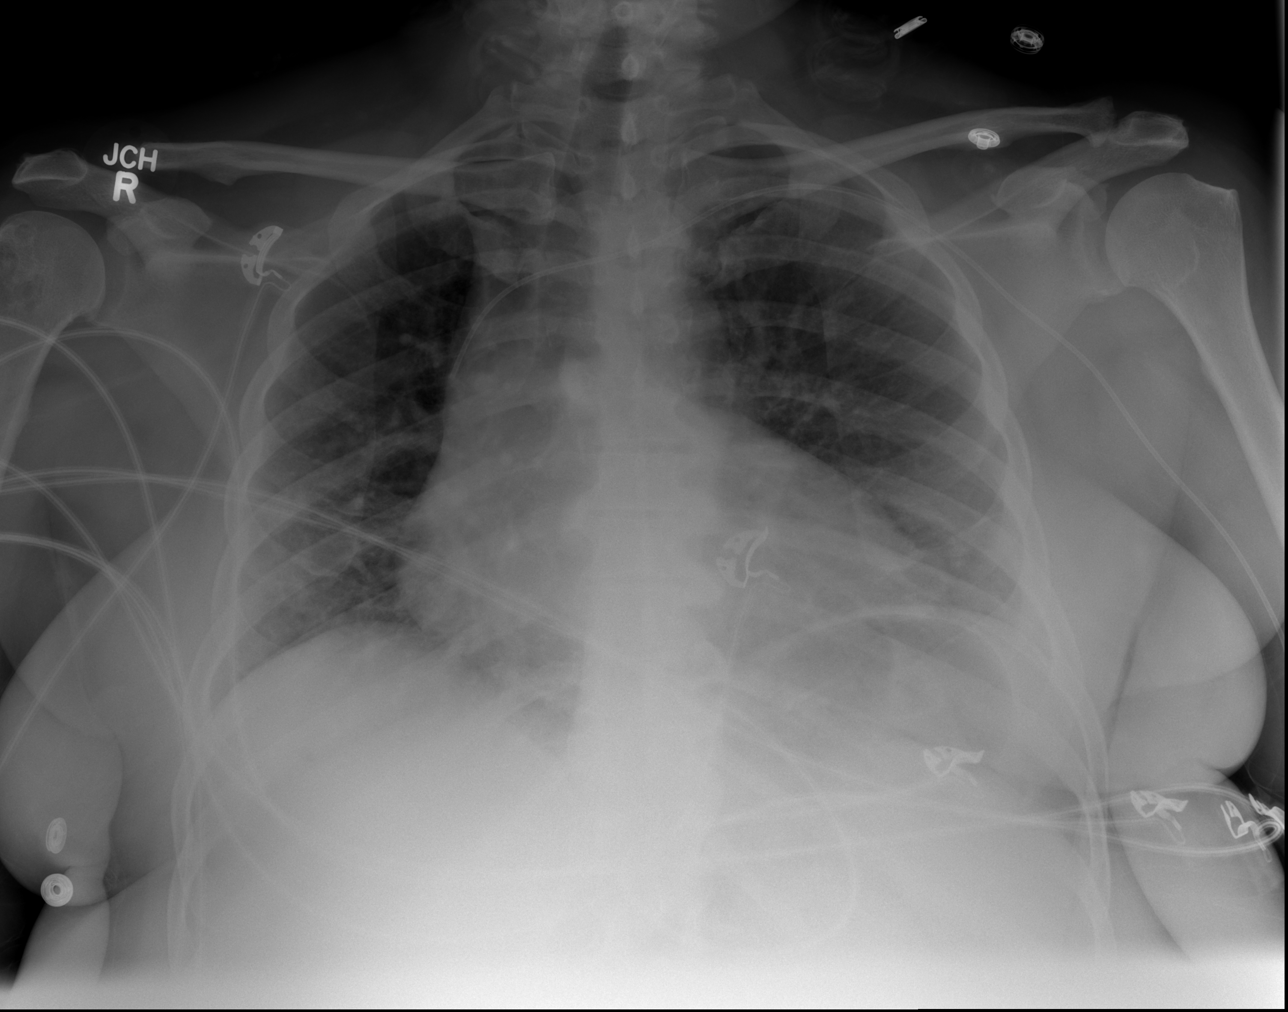

[3 of 3 positions shown; findings below may reference images not displayed]

FINDINGS: The patient has a new left PICC projects over the mid
superior vena cava.  There is cardiomegaly without edema.  No
pneumothorax or effusion.
IMPRESSION: 1.  Cardiomegaly without edema.
2.  Tip of left PICC projects in the mid superior vena cava.

## 2014-06-12 IMAGING — CT CT ANGIO CHEST
2 of 6 series · 19 of 46 positions shown · IV contrast (APPLIED)
Comparison: Plain films of the chest earlier this same date.

CLINICAL DATA: Chest pain and shortness of breath.

CT ANGIOGRAPHY CHEST
TECHNIQUE: Multidetector CT imaging of the chest using the
standard protocol during bolus administration of intravenous
contrast. Multiplanar reconstructed images including MIPs were
obtained and reviewed to evaluate the vascular anatomy.
Contrast: 100mL OMNIPAQUE IOHEXOL 350 MG/ML SOLN

[Series 6: pulm embolism 1.0 b25f thi · axial · 0.67mm/px · z∈[-68,+163]mm · 16 of 253 slices shown]
[im 11/253  lung]
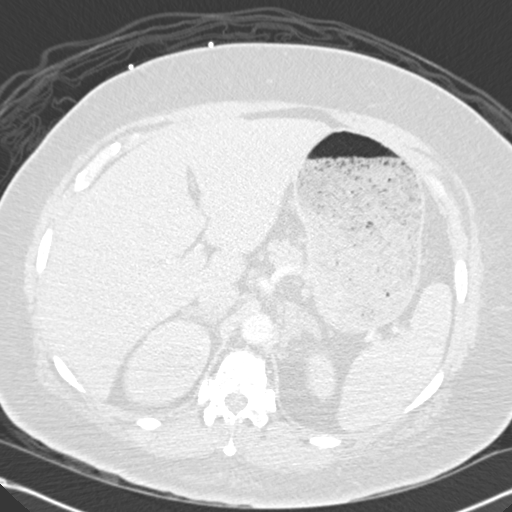
[im 33/253  soft-tissue]
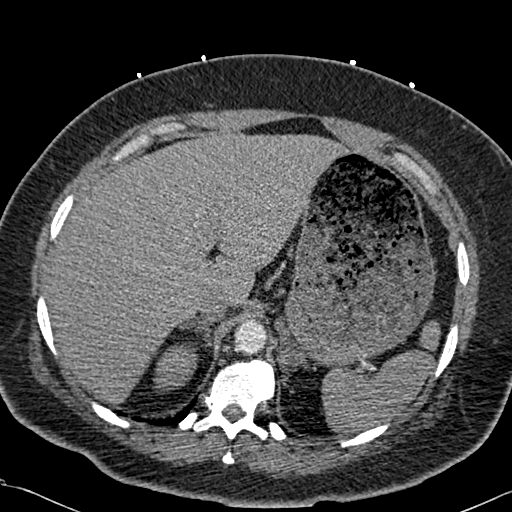
[im 44/253  lung]
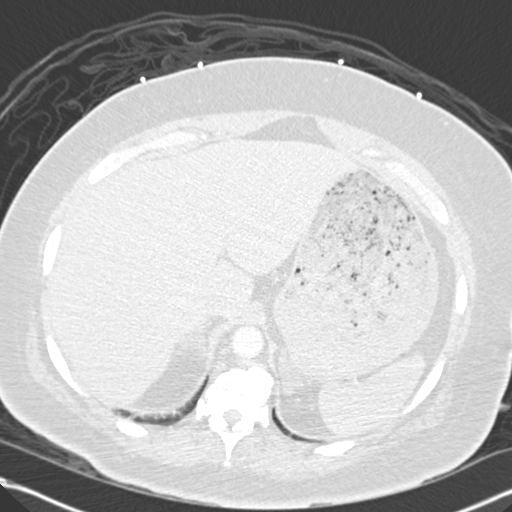
[im 55/253  soft-tissue]
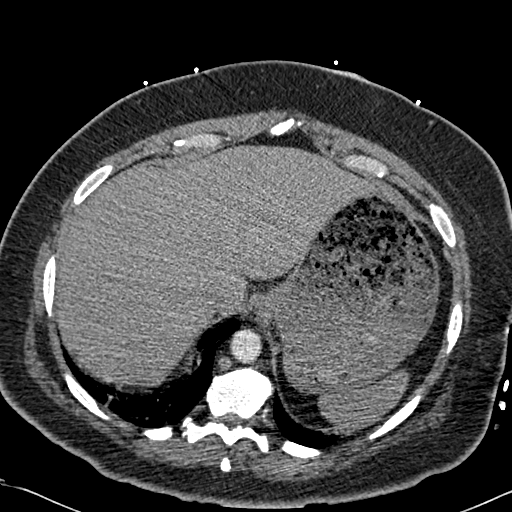
[im 77/253  lung]
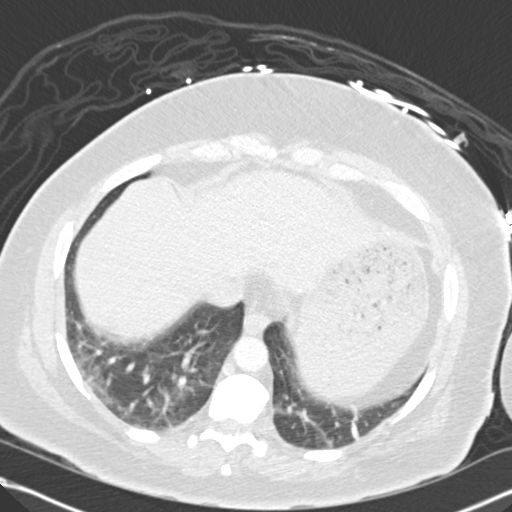
[im 88/253  soft-tissue]
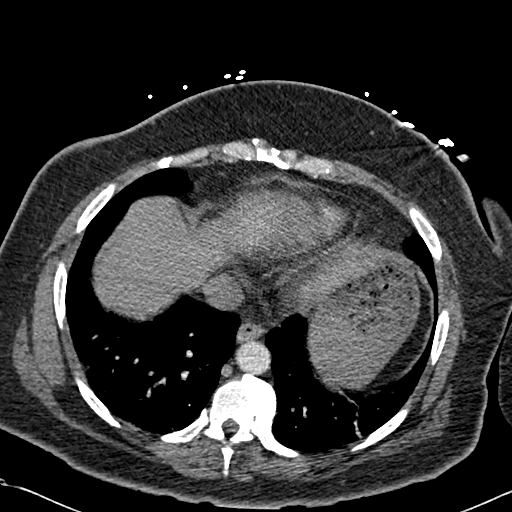
[im 99/253  lung]
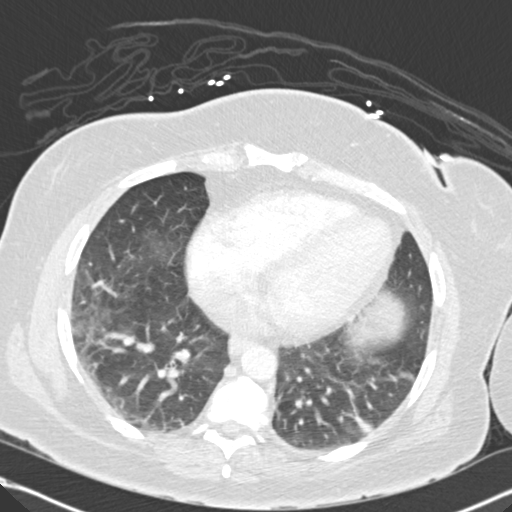
[im 121/253  soft-tissue]
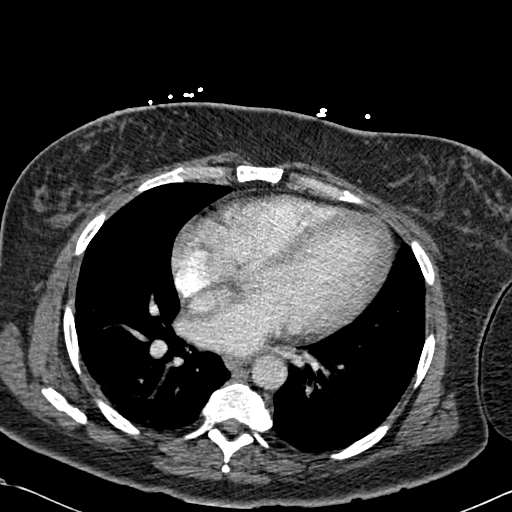
[im 132/253  lung]
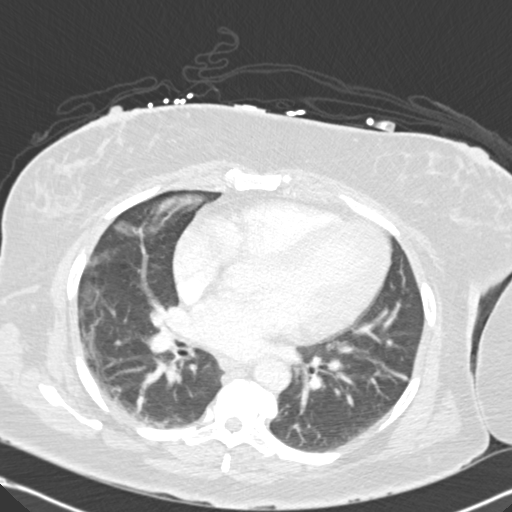
[im 154/253  soft-tissue]
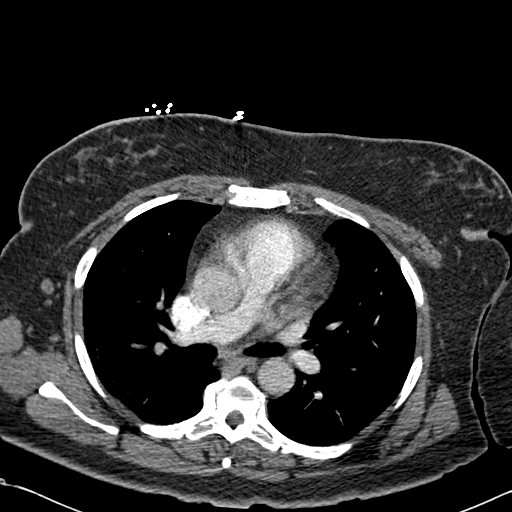
[im 165/253  lung]
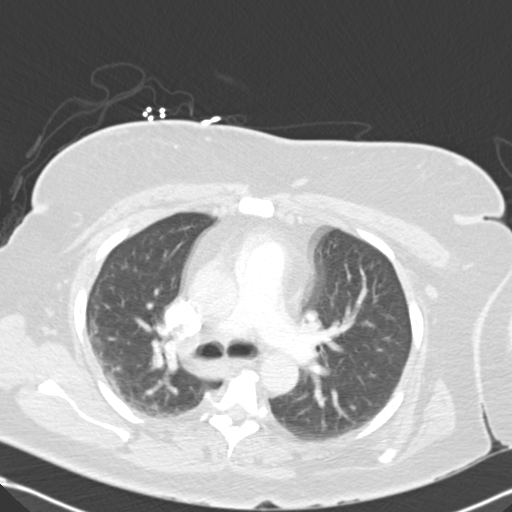
[im 176/253  soft-tissue]
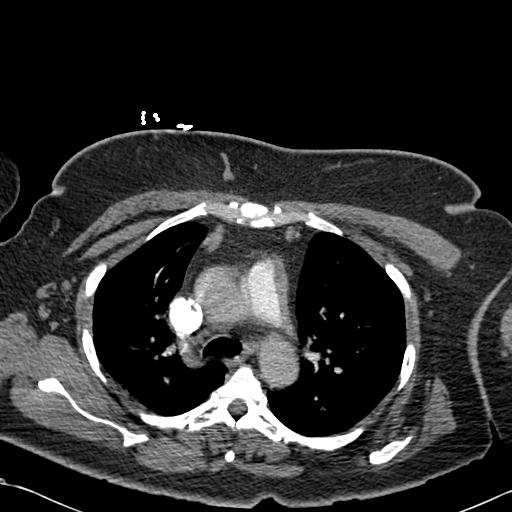
[im 198/253  lung]
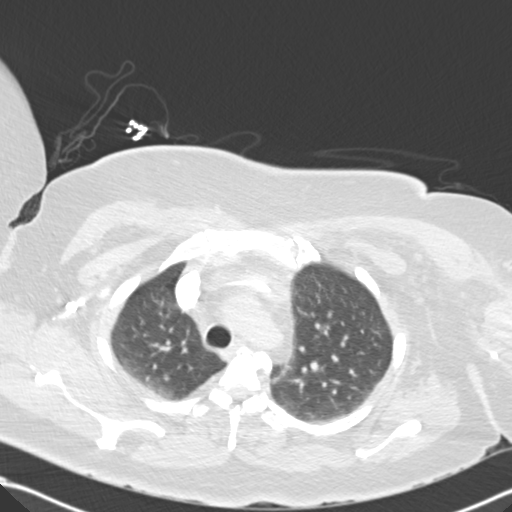
[im 209/253  soft-tissue]
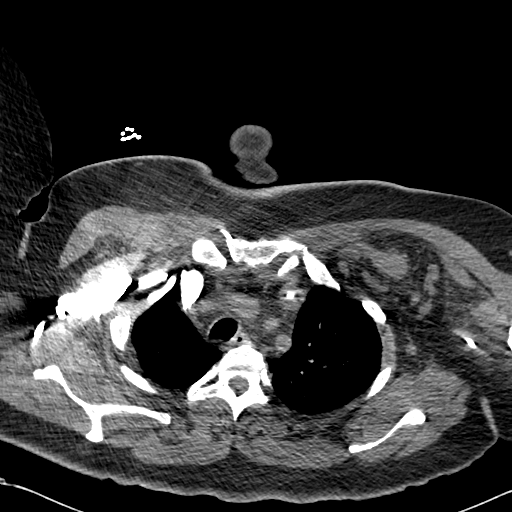
[im 220/253  lung]
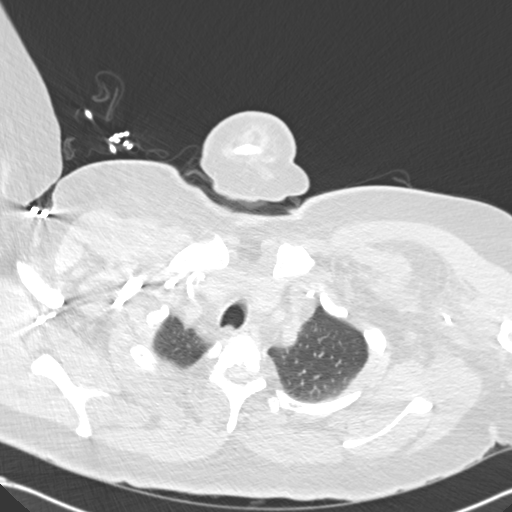
[im 242/253  soft-tissue]
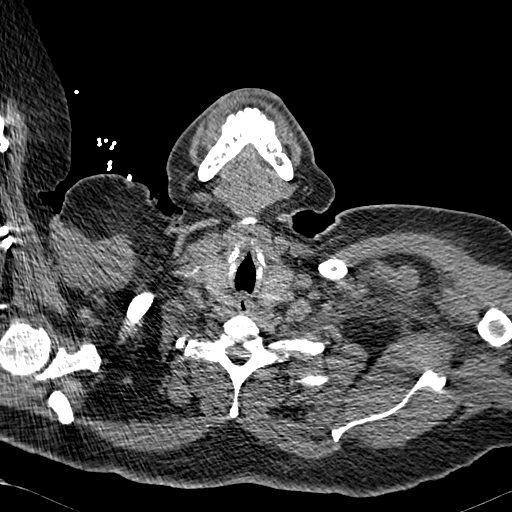

[Series 602: cor · coronal · 0.67mm/px · 3 of 116 slices shown]
[im 29/116  soft-tissue]
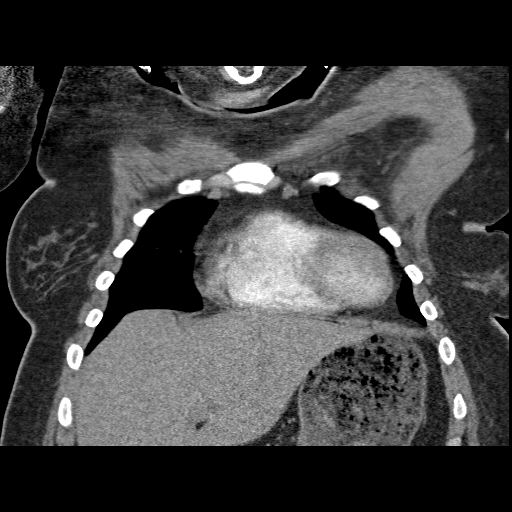
[im 58/116  soft-tissue]
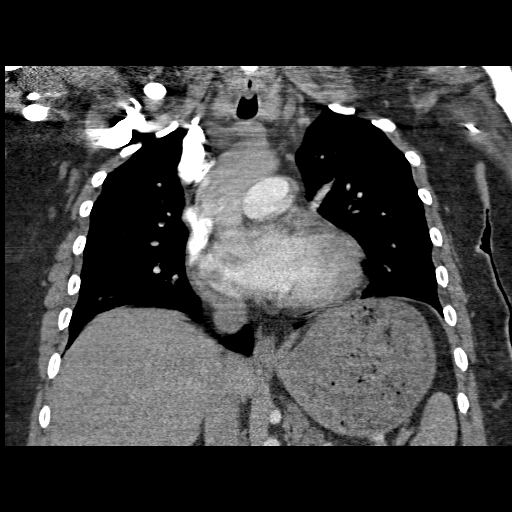
[im 87/116  soft-tissue]
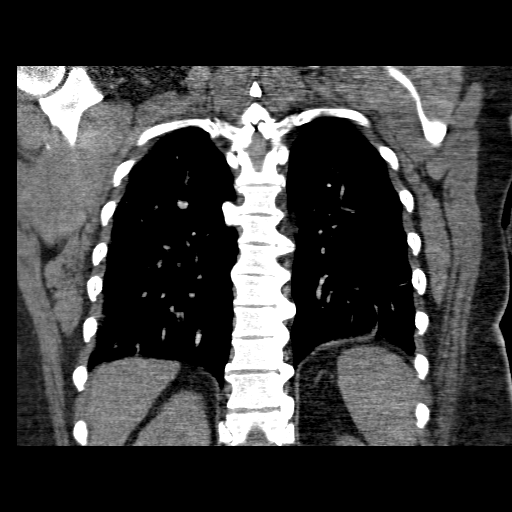

[19 of 46 positions shown; findings below may reference images not displayed]

FINDINGS: Study is somewhat limited due to suboptimal bolus timing.
No pulmonary embolus is identified.  There is no axillary, hilar or
mediastinal lymphadenopathy.  Cardiomegaly is noted.  Left PICC is
in place.  No pleural or pericardial effusion.  Dependent
atelectatic change is seen in the lungs bilaterally.  The lungs are
otherwise clear.  Mild thickening of the left adrenal gland is
unchanged.  Imaged intra-abdominal contents are otherwise
unremarkable.  No focal bony abnormality is identified.
IMPRESSION: Negative for pulmonary embolus or acute finding.  Cardiomegaly.

## 2014-07-20 IMAGING — CR DG FOOT COMPLETE 3+V*L*
3 series · 3 of 3 positions shown · non-contrast
Comparison: None.

CLINICAL DATA: Leg pain

LEFT FOOT - COMPLETE 3+ VIEW

[x foot ap left]
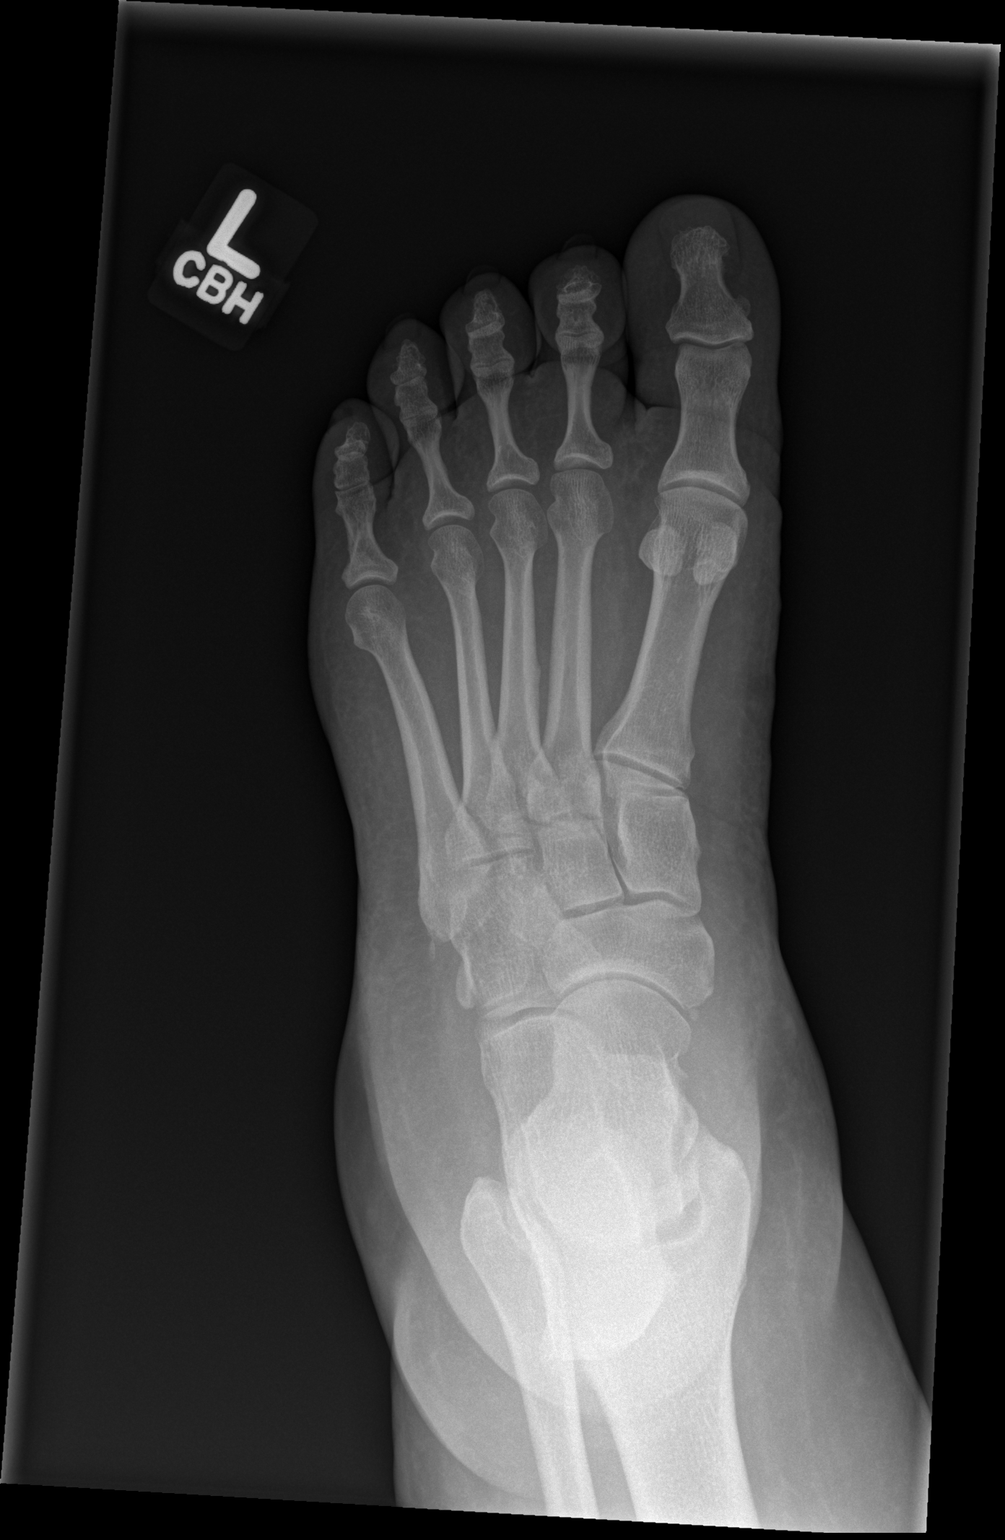

[x foot obl left]
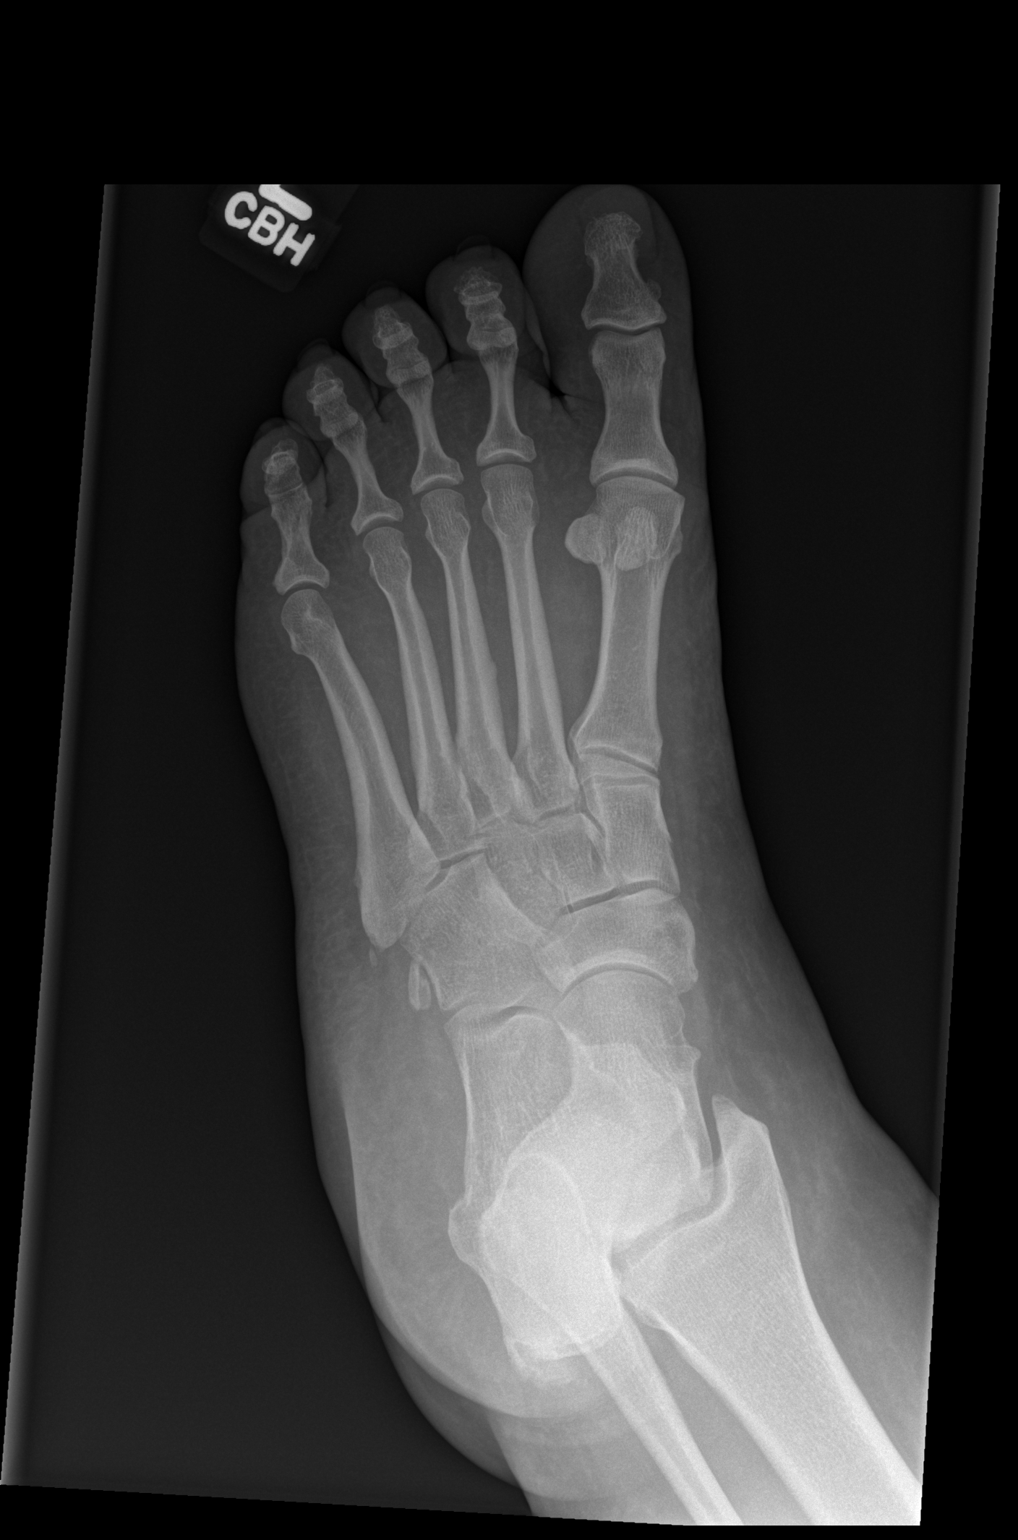

[x foot lat left]
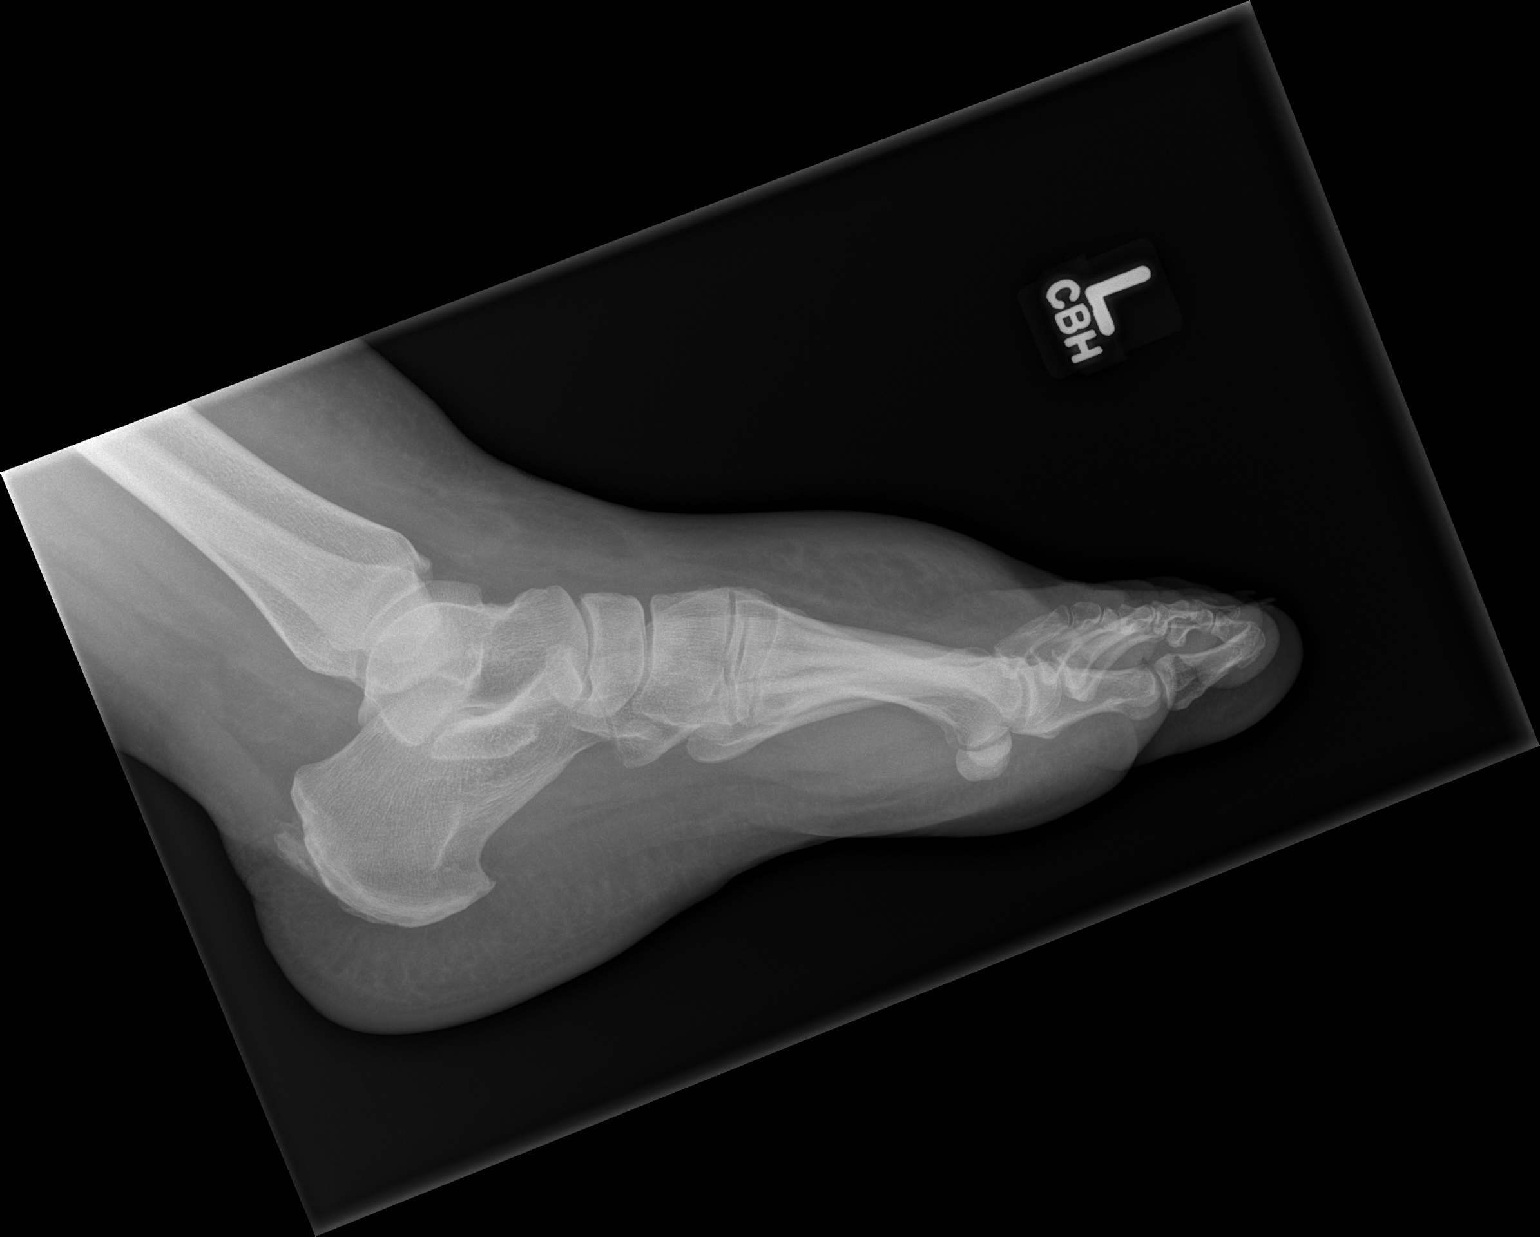

[3 of 3 positions shown; findings below may reference images not displayed]

FINDINGS: Three views of the left foot submitted.  No acute
fracture or subluxation.  Soft tissue swelling noted dorsal
metatarsal region.  Posterior spur of the calcaneus noted at the
Achilles tendon insertion.
IMPRESSION: No acute fracture or subluxation.  Soft tissue swelling dorsal
metatarsal region.  Posterior spurring of the calcaneus.

## 2014-07-20 IMAGING — CR DG TIBIA/FIBULA 2V*L*
4 series · 4 of 4 positions shown · non-contrast
Comparison: 06/15/2012

CLINICAL DATA: Leg pain.

LEFT TIBIA AND FIBULA - 2 VIEW

[t tib-fib ap left (1 of 2)]
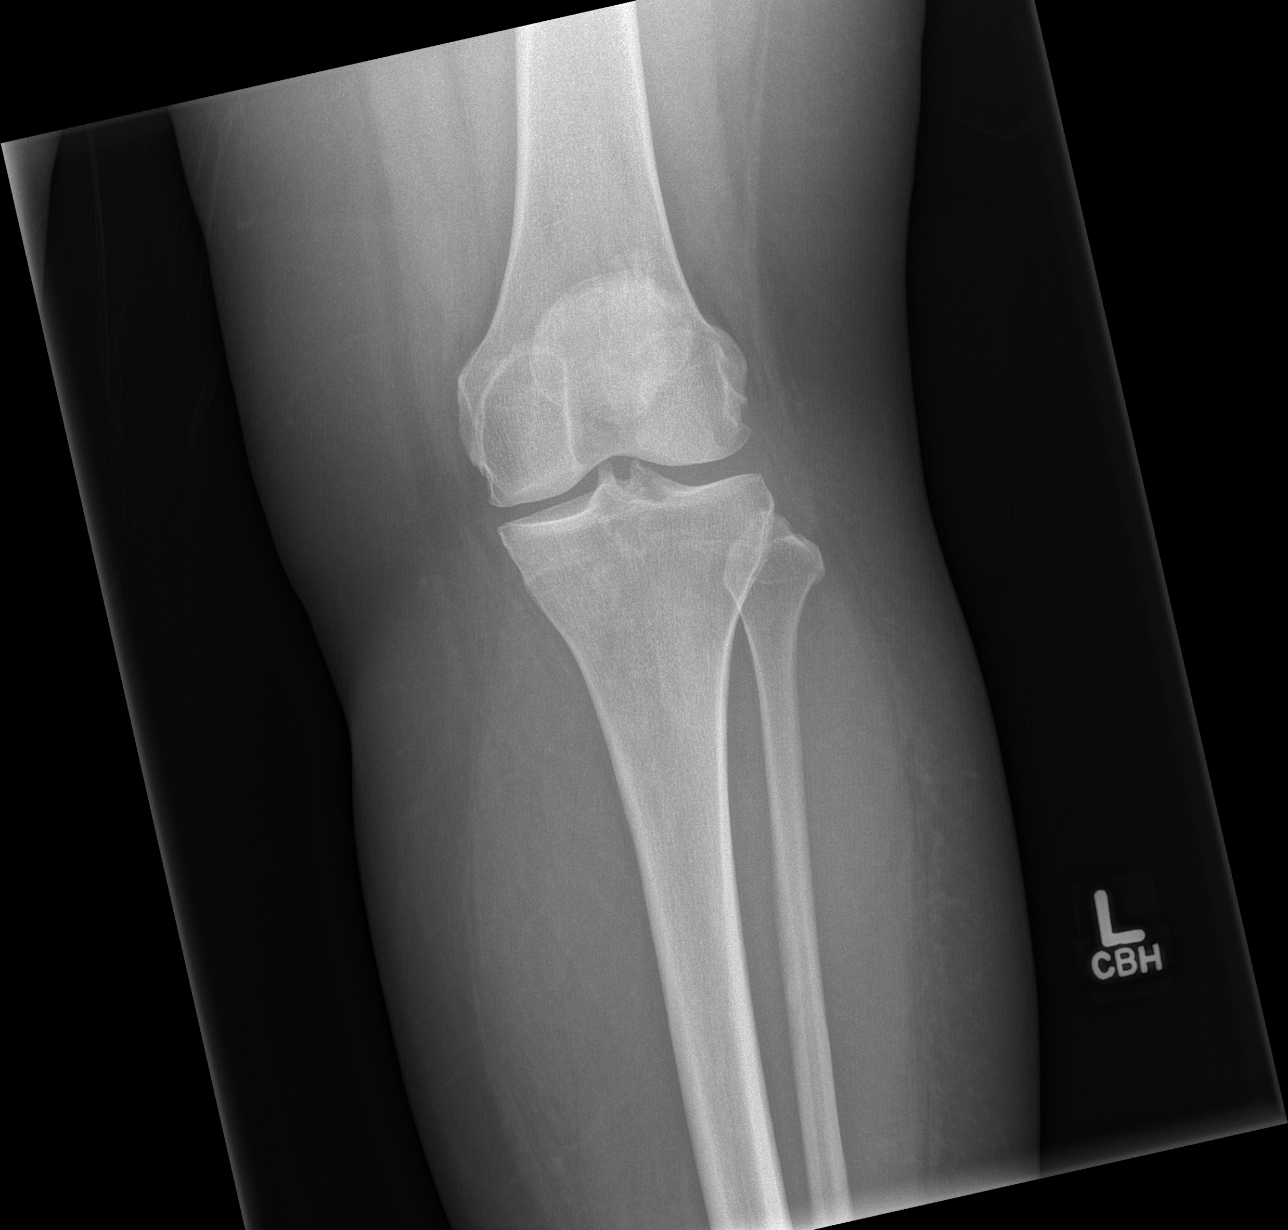

[t tib-fib ap left (2 of 2)]
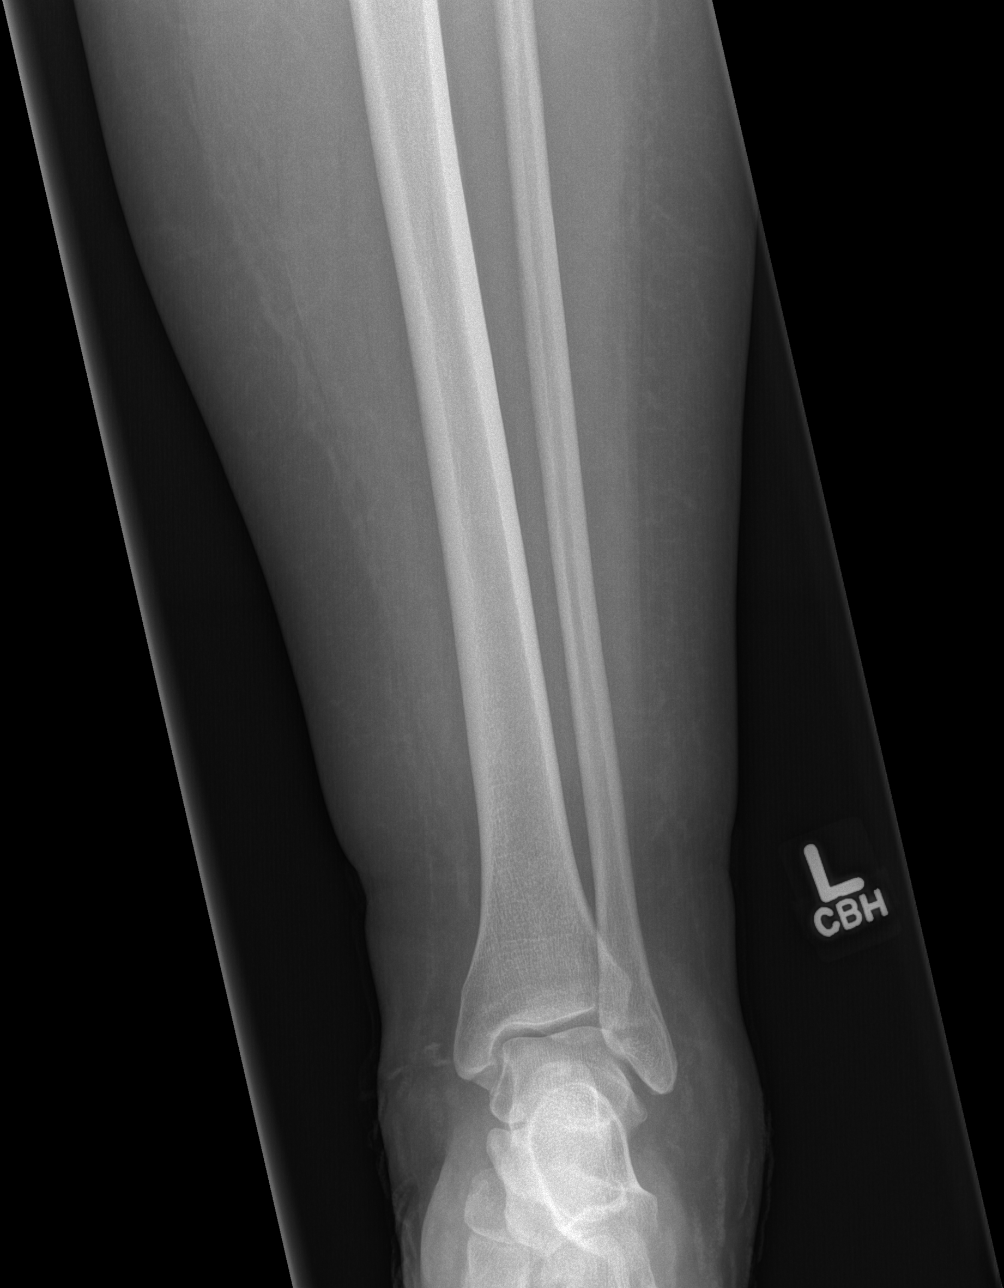

[t tib-fib lat left (1 of 2)]
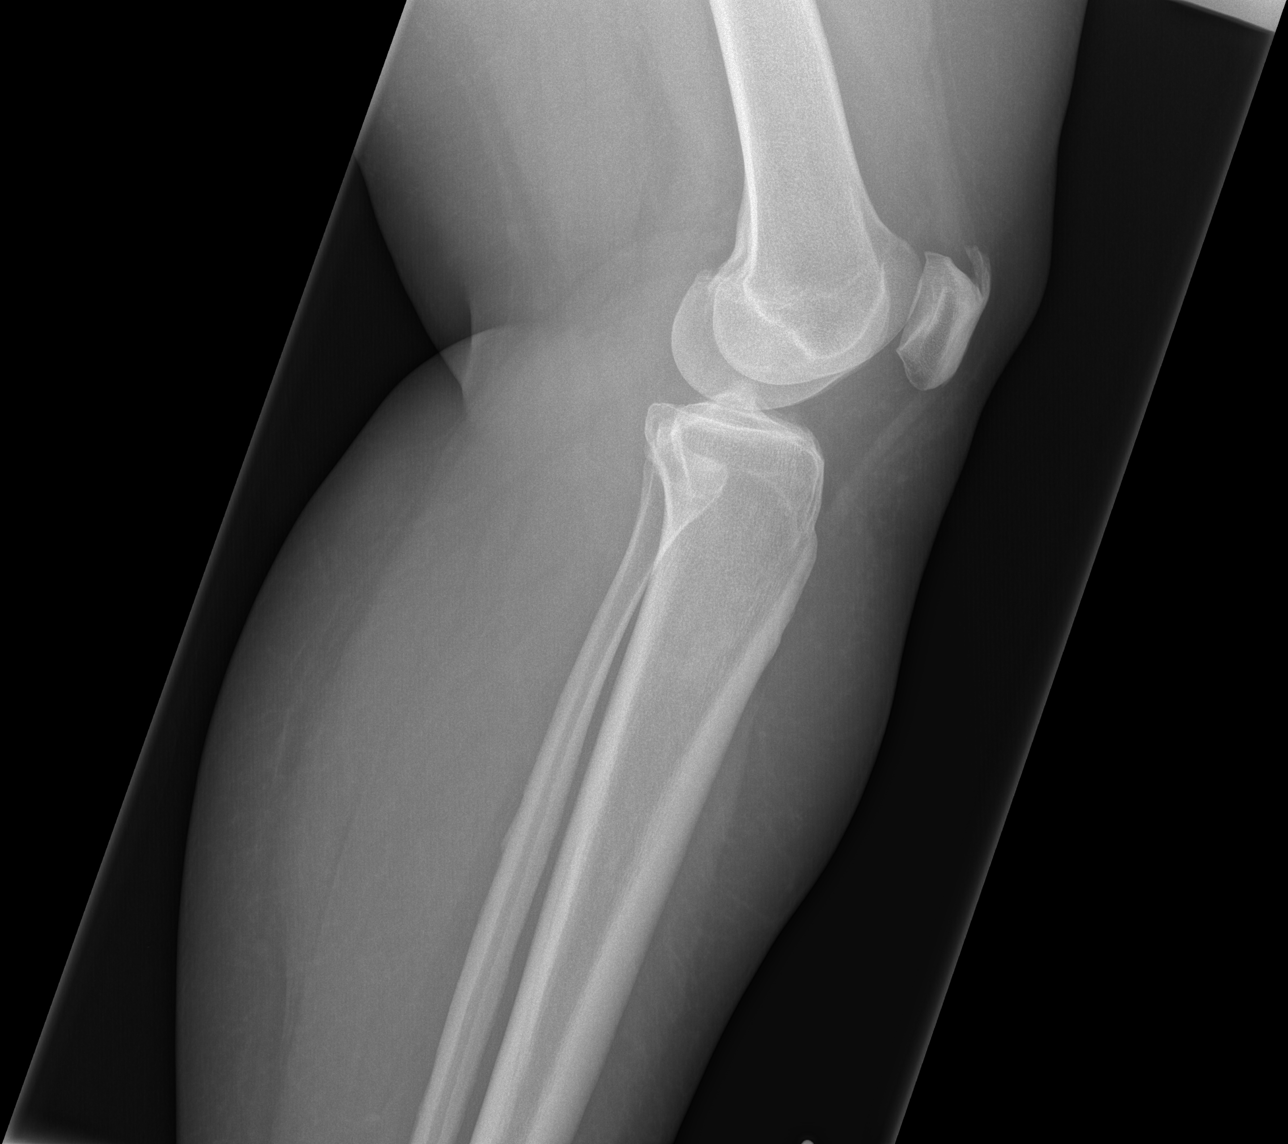

[t tib-fib lat left (2 of 2)]
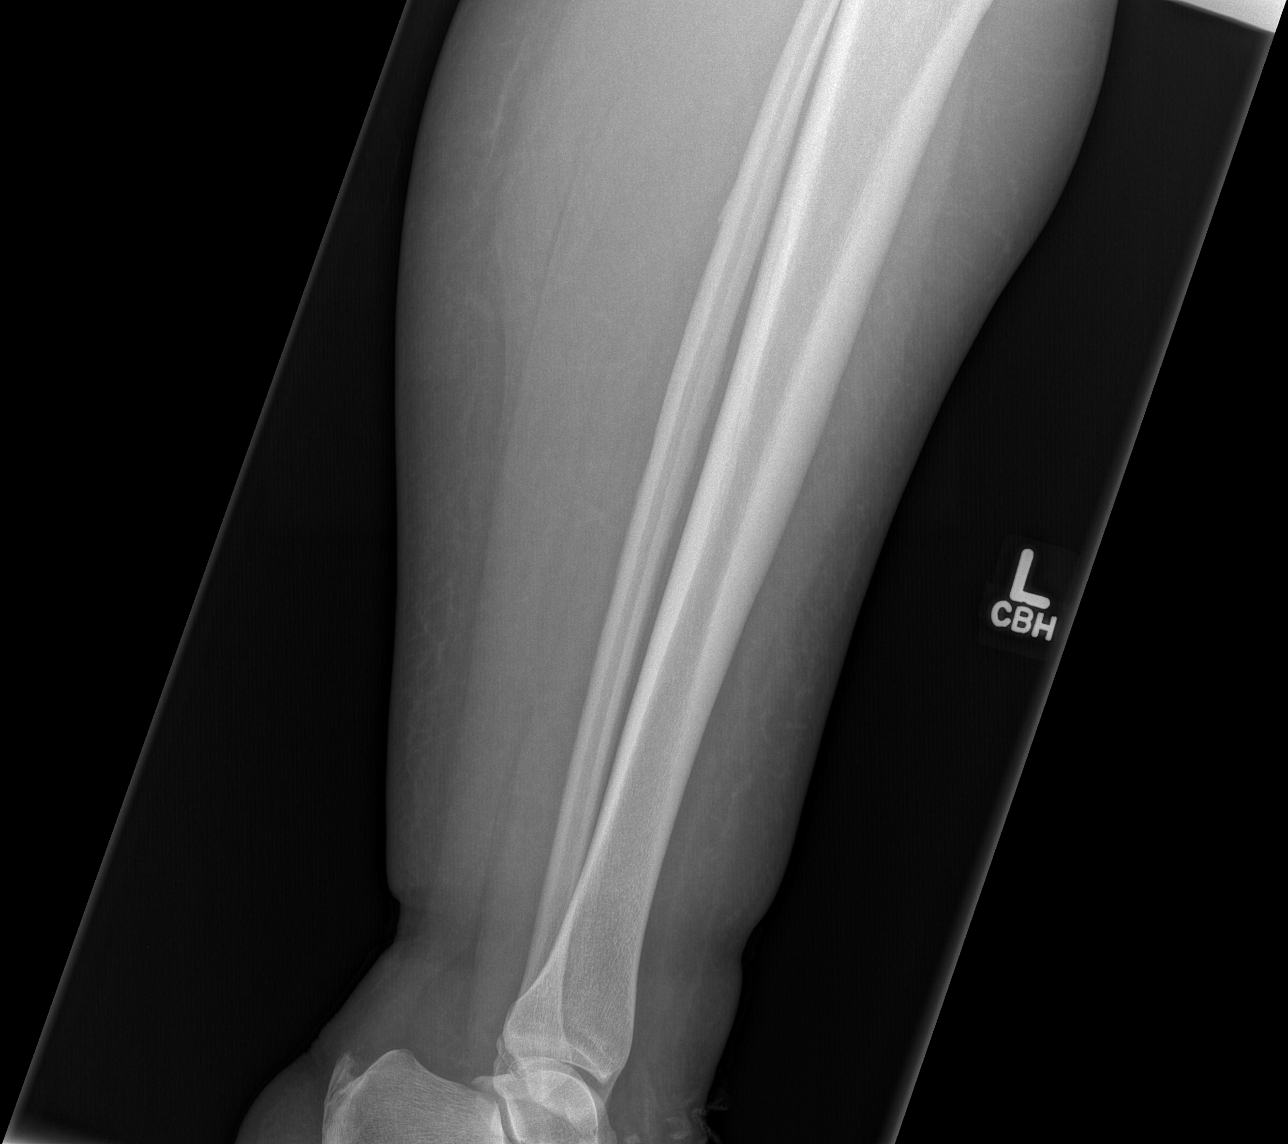

[4 of 4 positions shown; findings below may reference images not displayed]

FINDINGS: No acute bony abnormality.  Specifically, no fracture,
subluxation, or dislocation.  Soft tissues are intact.
IMPRESSION: No acute bony abnormality.

## 2014-08-16 IMAGING — CR DG LUMBAR SPINE COMPLETE 4+V
5 series · 5 of 5 positions shown · non-contrast
Comparison: Lumbar spine films of 04/01/2012

CLINICAL DATA: Recent fall, low back pain

LUMBAR SPINE - COMPLETE 4+ VIEW

[t l-spine a.p. *]
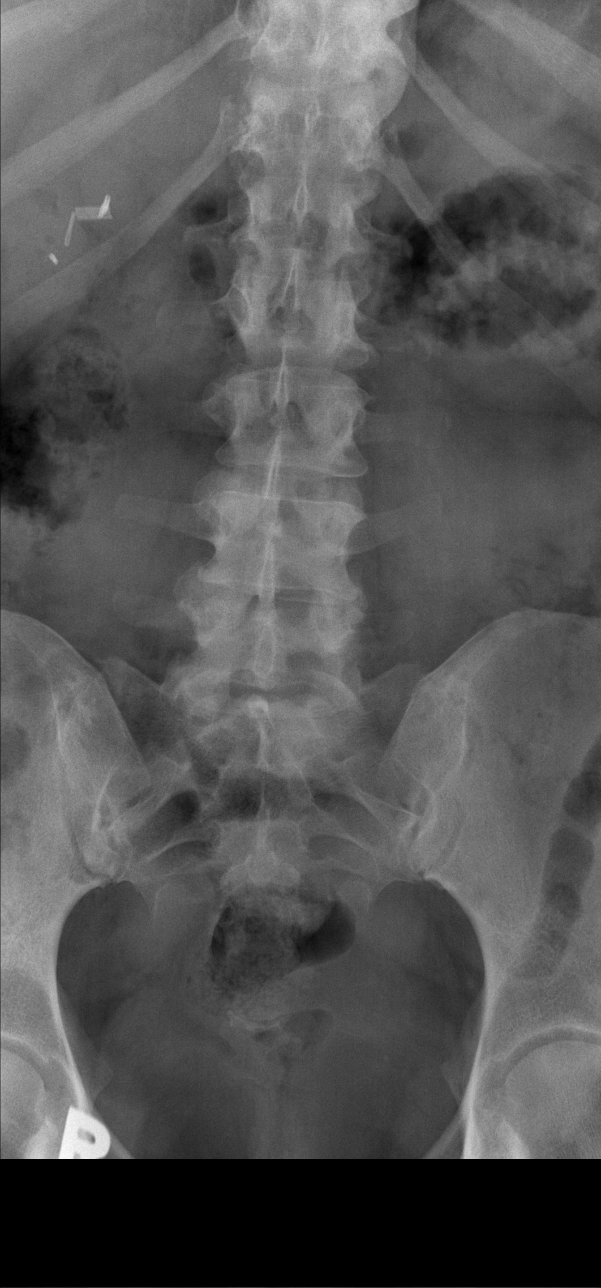

[t l-spine oblique exposure * (1 of 2)]
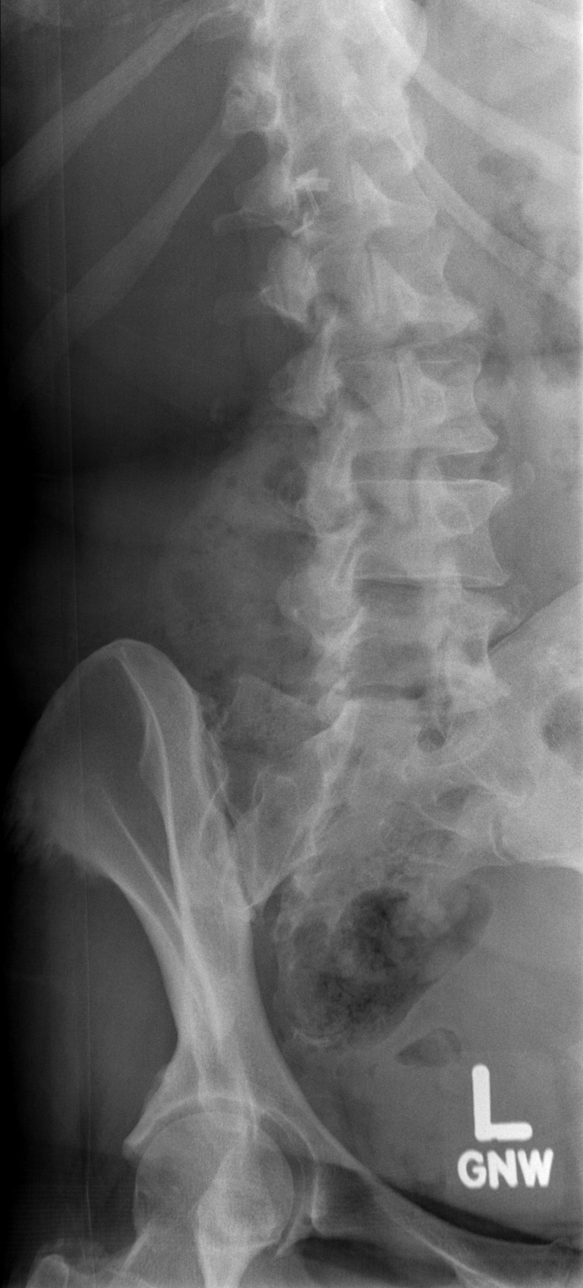

[t l-spine lat *]
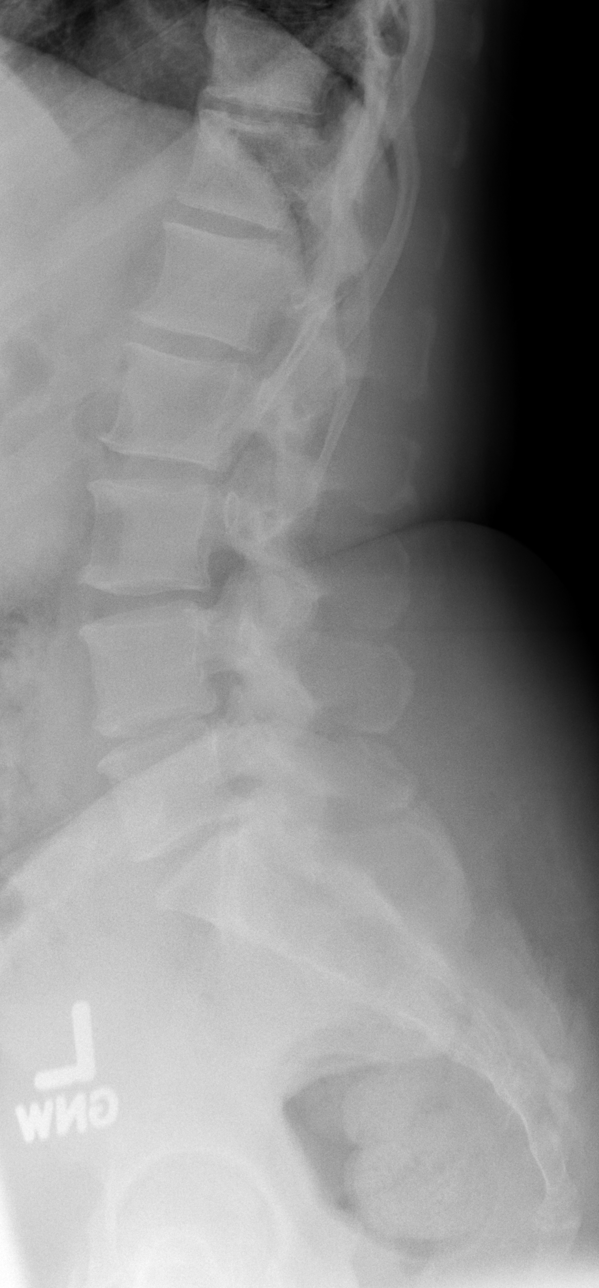

[t l-spine l5-s1 spot *]
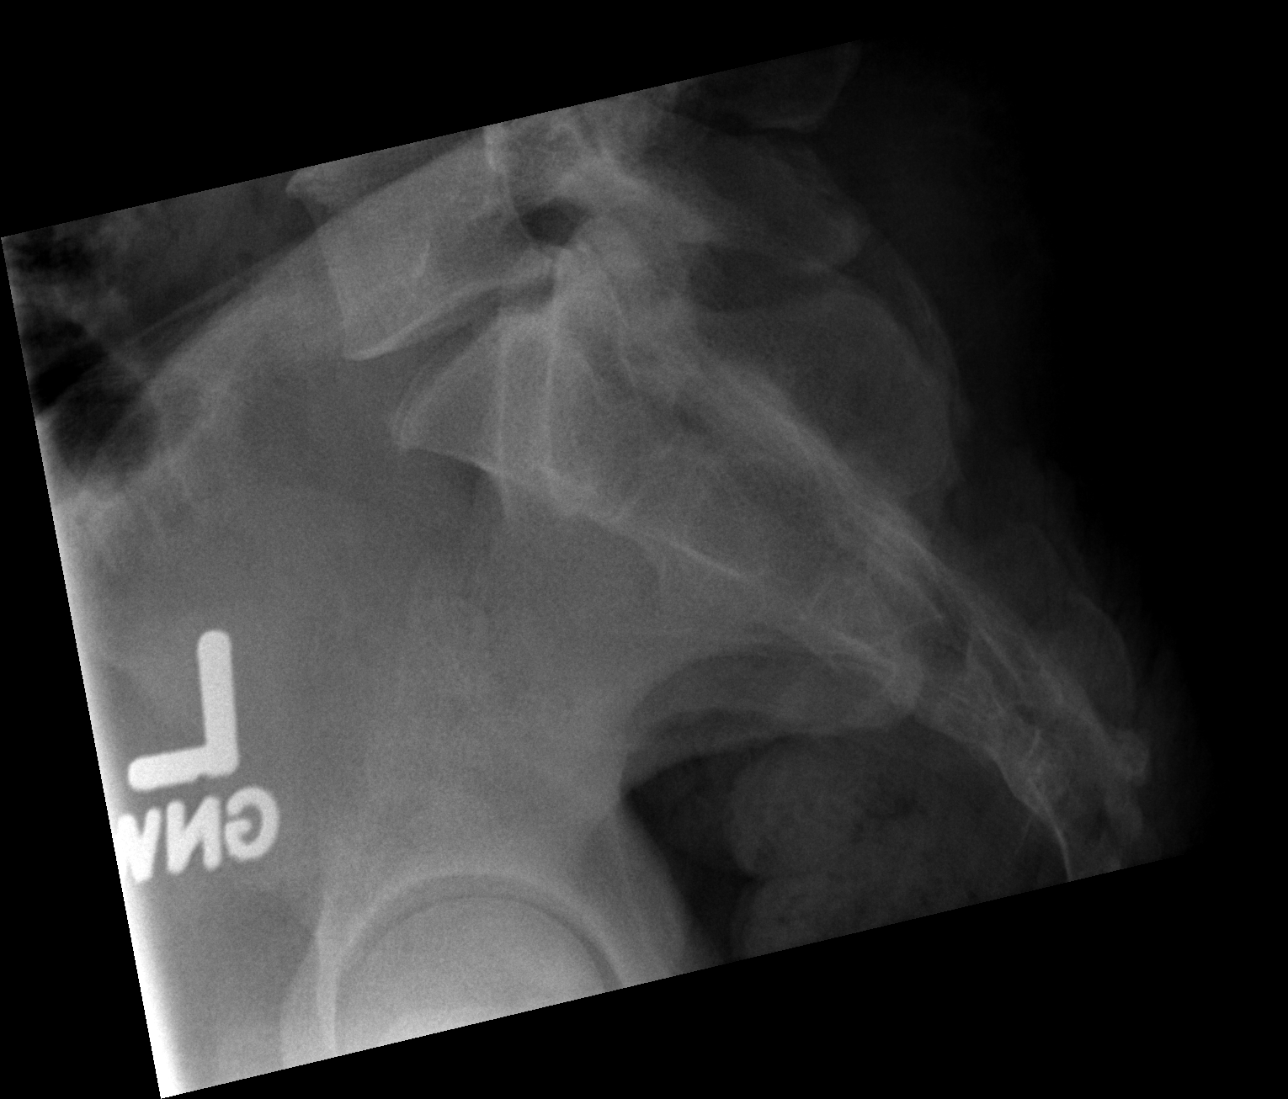

[t l-spine oblique exposure * (2 of 2)]
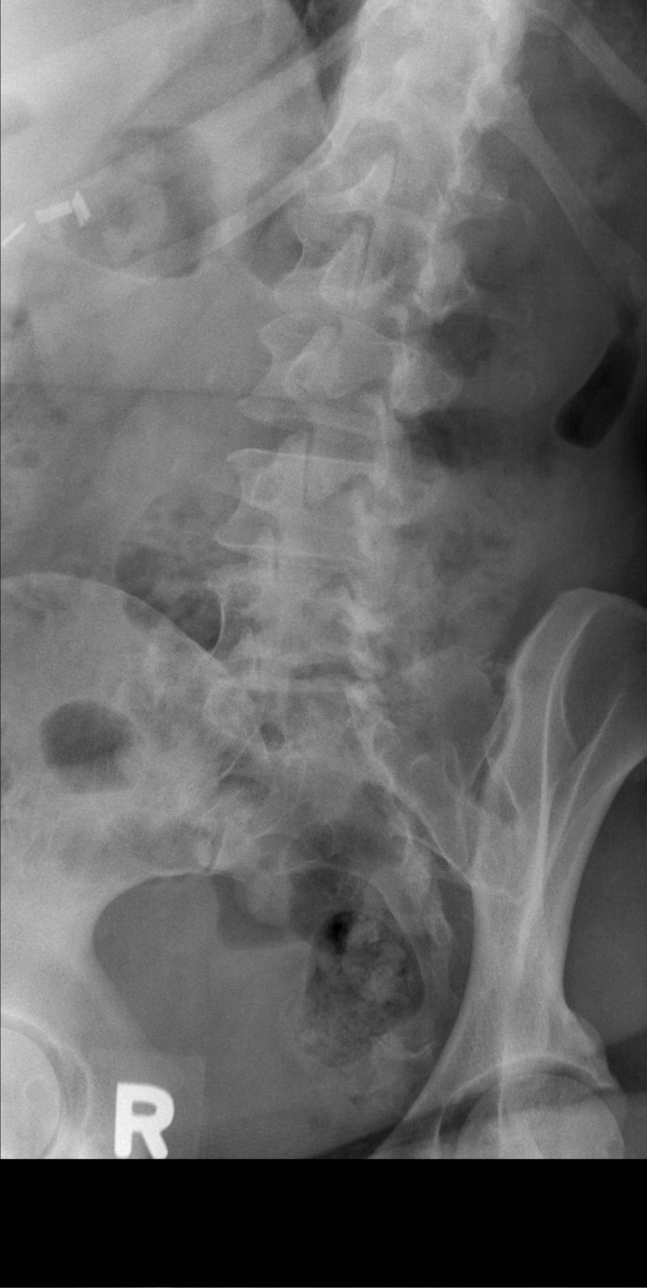

[5 of 5 positions shown; findings below may reference images not displayed]

FINDINGS: There is slight retrolisthesis of L5 on S1 by 5 mm.
Minimally decreased disc space is noted at both L4-5 and to a
lesser degree at L5-S1.  Surgical clips are noted in the right
upper quadrant from prior cholecystectomy.
IMPRESSION: 1.  Slightly decreased disc space at L4-5 and to a lesser degree at
L5-S1.
2.  Mild retrolisthesis of L5 on S1 by 5 mm

## 2014-10-05 IMAGING — CR DG CHEST 1V PORT
1 series · 1 of 1 positions shown · non-contrast
Comparison: Portable exam at 4651 hrs compared to 05/08/2012

CLINICAL DATA: Shortness of breath

PORTABLE CHEST - 1 VIEW

[AP]
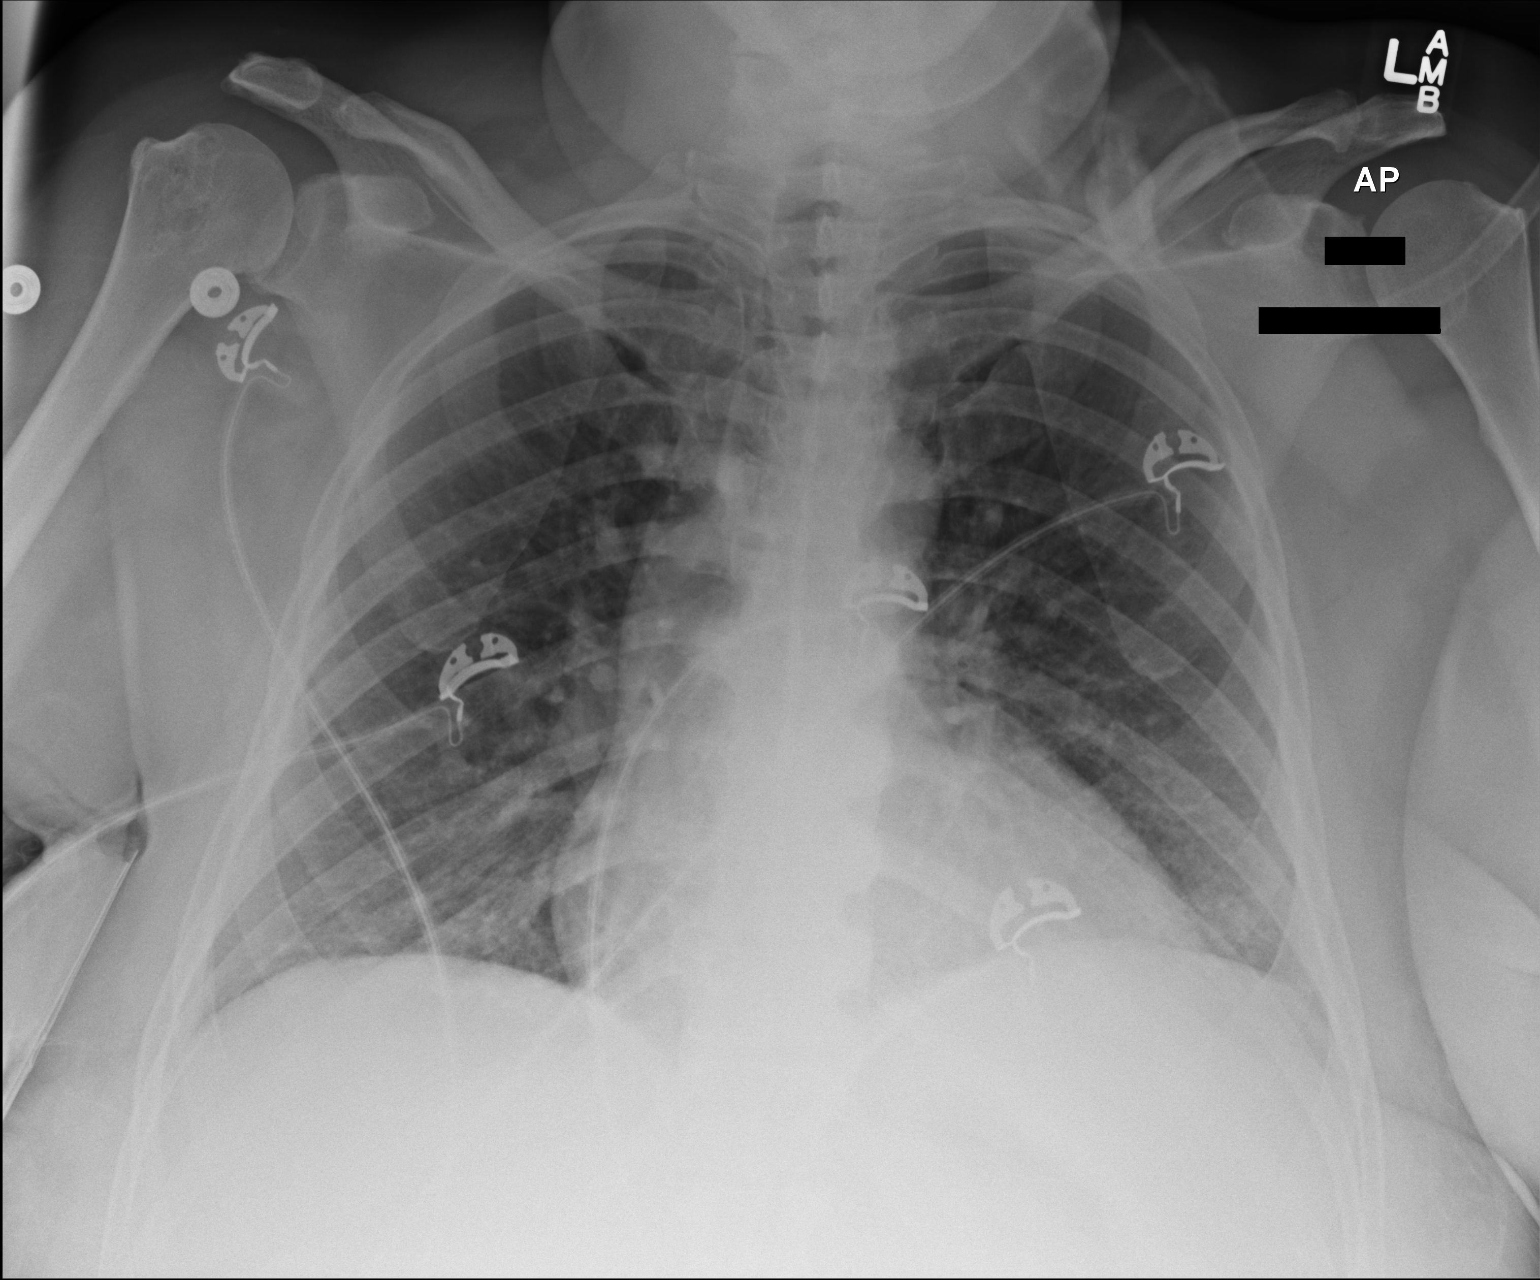

[1 of 1 positions shown; findings below may reference images not displayed]

FINDINGS: Borderline enlargement of cardiac silhouette.
Mediastinal contours and pulmonary vascularity normal.
Mild atelectasis right base.
Lungs otherwise clear.
No pleural effusion or pneumothorax.
No acute osseous findings.
IMPRESSION: Mild right basilar atelectasis.

## 2014-10-25 ENCOUNTER — Other Ambulatory Visit: Payer: Self-pay | Admitting: Cardiology

## 2014-10-27 ENCOUNTER — Emergency Department (HOSPITAL_COMMUNITY): Payer: Medicaid Other

## 2014-10-27 ENCOUNTER — Encounter (HOSPITAL_COMMUNITY): Payer: Self-pay | Admitting: Emergency Medicine

## 2014-10-27 ENCOUNTER — Emergency Department (HOSPITAL_COMMUNITY)
Admission: EM | Admit: 2014-10-27 | Discharge: 2014-10-27 | Disposition: A | Discharge status: Home / Self Care | Payer: Medicaid Other | Attending: Emergency Medicine | Admitting: Emergency Medicine

## 2014-10-27 DIAGNOSIS — J45901 Unspecified asthma with (acute) exacerbation: Secondary | ICD-10-CM | POA: Insufficient documentation

## 2014-10-27 DIAGNOSIS — R000 Tachycardia, unspecified: Secondary | ICD-10-CM | POA: Insufficient documentation

## 2014-10-27 DIAGNOSIS — K219 Gastro-esophageal reflux disease without esophagitis: Secondary | ICD-10-CM

## 2014-10-27 DIAGNOSIS — F319 Bipolar disorder, unspecified: Secondary | ICD-10-CM | POA: Insufficient documentation

## 2014-10-27 DIAGNOSIS — I509 Heart failure, unspecified: Secondary | ICD-10-CM | POA: Insufficient documentation

## 2014-10-27 DIAGNOSIS — R079 Chest pain, unspecified: Secondary | ICD-10-CM

## 2014-10-27 DIAGNOSIS — Z7901 Long term (current) use of anticoagulants: Secondary | ICD-10-CM | POA: Insufficient documentation

## 2014-10-27 DIAGNOSIS — R197 Diarrhea, unspecified: Secondary | ICD-10-CM | POA: Insufficient documentation

## 2014-10-27 DIAGNOSIS — R110 Nausea: Secondary | ICD-10-CM | POA: Insufficient documentation

## 2014-10-27 DIAGNOSIS — F419 Anxiety disorder, unspecified: Secondary | ICD-10-CM | POA: Insufficient documentation

## 2014-10-27 DIAGNOSIS — M1990 Unspecified osteoarthritis, unspecified site: Secondary | ICD-10-CM | POA: Insufficient documentation

## 2014-10-27 DIAGNOSIS — Z720 Tobacco use: Secondary | ICD-10-CM | POA: Insufficient documentation

## 2014-10-27 DIAGNOSIS — R0789 Other chest pain: Secondary | ICD-10-CM | POA: Insufficient documentation

## 2014-10-27 DIAGNOSIS — I10 Essential (primary) hypertension: Secondary | ICD-10-CM | POA: Insufficient documentation

## 2014-10-27 DIAGNOSIS — R61 Generalized hyperhidrosis: Secondary | ICD-10-CM | POA: Insufficient documentation

## 2014-10-27 DIAGNOSIS — Z79899 Other long term (current) drug therapy: Secondary | ICD-10-CM | POA: Insufficient documentation

## 2014-10-27 LAB — COMPREHENSIVE METABOLIC PANEL
ALT: 19 U/L (ref 0–35)
AST: 13 U/L (ref 0–37)
Albumin: 3.2 g/dL — ABNORMAL LOW (ref 3.5–5.2)
Alkaline Phosphatase: 73 U/L (ref 39–117)
Anion gap: 4 — ABNORMAL LOW (ref 5–15)
BUN: 10 mg/dL (ref 6–23)
CO2: 30 mmol/L (ref 19–32)
Calcium: 9 mg/dL (ref 8.4–10.5)
Chloride: 104 mmol/L (ref 96–112)
Creatinine, Ser: 0.82 mg/dL (ref 0.50–1.10)
GFR calc Af Amer: >90 mL/min (ref >90)
GFR calc non Af Amer: 86 mL/min — ABNORMAL LOW (ref >90)
Glucose, Bld: 113 mg/dL — ABNORMAL HIGH (ref 70–99)
Potassium: 3.6 mmol/L (ref 3.5–5.1)
Sodium: 138 mmol/L (ref 135–145)
Total Bilirubin: 0.5 mg/dL (ref 0.3–1.2)
Total Protein: 6.5 g/dL (ref 6.0–8.3)

## 2014-10-27 LAB — I-STAT TROPONIN, ED: Troponin i, poc: 0 ng/mL (ref 0.00–0.08)

## 2014-10-27 LAB — CBC WITH DIFFERENTIAL/PLATELET
Basophils Absolute: 0 10*3/uL (ref 0.0–0.1)
Basophils Relative: 0 % (ref 0–1)
Eosinophils Absolute: 0.5 10*3/uL (ref 0.0–0.7)
Eosinophils Relative: 5 % (ref 0–5)
HCT: 36.6 % (ref 36.0–46.0)
Hemoglobin: 11.9 g/dL — ABNORMAL LOW (ref 12.0–15.0)
Lymphocytes Relative: 21 % (ref 12–46)
Lymphs Abs: 2.2 10*3/uL (ref 0.7–4.0)
MCH: 28.5 pg (ref 26.0–34.0)
MCHC: 32.5 g/dL (ref 30.0–36.0)
MCV: 87.8 fL (ref 78.0–100.0)
Monocytes Absolute: 1.2 10*3/uL — ABNORMAL HIGH (ref 0.1–1.0)
Monocytes Relative: 12 % (ref 3–12)
Neutro Abs: 6.3 10*3/uL (ref 1.7–7.7)
Neutrophils Relative %: 62 % (ref 43–77)
Platelets: 312 10*3/uL (ref 150–400)
RBC: 4.17 MIL/uL (ref 3.87–5.11)
RDW: 14.1 % (ref 11.5–15.5)
WBC: 10.2 10*3/uL (ref 4.0–10.5)

## 2014-10-27 MED ORDER — ONDANSETRON HCL 4 MG/2ML IJ SOLN
4.0000 mg | Freq: Once | INTRAMUSCULAR | Status: AC
  Administered 2014-10-27: 4 mg via INTRAVENOUS
  Filled 2014-10-27: qty 2

## 2014-10-27 MED ORDER — OMEPRAZOLE 20 MG PO CPDR: 20.0000 mg | DELAYED_RELEASE_CAPSULE | Freq: Every day | ORAL | Status: DC

## 2014-10-27 MED ORDER — GI COCKTAIL ~~LOC~~
30.0000 mL | Freq: Once | ORAL | Status: AC
  Administered 2014-10-27: 30 mL via ORAL
  Filled 2014-10-27: qty 30

## 2014-10-27 NOTE — ED Notes (Signed)
Pt. Given a box lunch X2 with drinks.  Pt. Also will be given a cab voucher due to the unavailability of family to pick up pt.

## 2014-10-27 NOTE — ED Notes (Addendum)
The patient said she started having chest pain an hour ago. She denies any other symptoms.  Patient called EMS they transported her here from home.  She was given 324mg  of Aspirin, 4 SL nitros.  She rates her pain 10/10 after the nitros her pain is 6/10.

## 2014-10-27 NOTE — Discharge Instructions (Signed)

## 2014-10-27 NOTE — ED Provider Notes (Signed)
CSN: [id number]     Arrival date & time 10/27/14  2482 History   First MD Initiated Contact with Patient 10/27/14 0654     Chief Complaint  Patient presents with  . Chest Pain    The patient said she started having chest pain an hour ago. She denies any other symptoms.     (Consider location/radiation/quality/duration/timing/severity/associated sxs/prior Treatment) Patient is a 44 y.o. female presenting with chest pain. The history is provided by the patient.  Chest Pain Pain location:  Substernal area Associated symptoms: diaphoresis (patient states she has had episodes of sweating somewhat over the last few days, but none today.), nausea and shortness of breath   Associated symptoms: no abdominal pain, no back pain, no headache, no numbness, not vomiting and no weakness    patient presents with chest pain. Began this morning. It is sharp and stabbing in the front of her chest. It is worse with palpation and movements. She's had some shortness of breath occasionally over the last few days but none now. No fevers. She's had nausea without vomiting. She states she has had diarrhea recently. No abdominal pain. She states she has a history of heart failure and is worried this could be it. also history of GERD. Pain is not worse with breathing. Not worse with exertion. She's not eating breakfast this morning. States it is severe.  Past Medical History  Diagnosis Date  . Asthma   . Hypertension   . Anxiety   . Snoring disorder     Pt stated my boyfriend always wakes me up and tells me to breathe  . Heart murmur   . CHF (congestive heart failure)   . Shortness of breath   . Bronchitis     hx of  . Bipolar affective disorder     Schizophrenia  . Depression   . GERD (gastroesophageal reflux disease)   . Arthritis   . Overactive bladder   . Schizophrenia   . Sciatica   . Anginal pain     occ from asthma   Past Surgical History  Procedure Laterality Date  . Rotator cuff repair     Right shoulder  . Tubal ligation    . Gallstones reomved    . Lumbar laminectomy/decompression microdiscectomy  04/01/2012    Procedure: LUMBAR LAMINECTOMY/DECOMPRESSION MICRODISCECTOMY 2 LEVELS;  Surgeon: Faythe Ghee, MD;  Location: [facility];  Service: Neurosurgery;  Laterality: Left;  Lumbar four-five, lumbar five sacral one microdiscectomy   . Lumbar wound debridement  04/29/2012    Procedure: LUMBAR WOUND DEBRIDEMENT;  Surgeon: Faythe Ghee, MD;  Location: [facility];  Service: Neurosurgery;  Laterality: N/A;  lumbar wound debridement  . Cholecystectomy    . Eye surgery      Metal plate in right eye  . Back surgery      3 back surgeries   Family History  Problem Relation Age of Onset  . Diabetes Mother   . Diabetes Father   . Heart disease Paternal Aunt   . Cancer Paternal Aunt    History  Substance Use Topics  . Smoking status: Current Every Day Smoker -- 0.25 packs/day for 9 years    Types: Cigarettes  . Smokeless tobacco: Never Used  . Alcohol Use: No     Comment: quit Nov. 2014   OB History    No data available     Review of Systems  Constitutional: Positive for diaphoresis (patient states she has had episodes of sweating somewhat  over the last few days, but none today.). Negative for activity change and appetite change.  Eyes: Negative for pain.  Respiratory: Positive for shortness of breath. Negative for chest tightness.   Cardiovascular: Positive for chest pain. Negative for leg swelling.  Gastrointestinal: Positive for nausea and diarrhea. Negative for vomiting and abdominal pain.  Genitourinary: Negative for flank pain.  Musculoskeletal: Negative for back pain and neck stiffness.  Skin: Negative for rash.  Neurological: Negative for weakness, numbness and headaches.  Psychiatric/Behavioral: Negative for behavioral problems.      Allergies  Aspirin  Home Medications   Prior to Admission medications   Medication Sig Start Date End Date  Taking? Authorizing Provider  Albuterol Sulfate (PROAIR RESPICLICK) 718 (90 BASE) MCG/ACT AEPB Inhale 2 puffs into the lungs every 4 (four) hours as needed (Every 4-6 hours as needed for wheezing). 05/10/14  Yes Lance Bosch, NP  amLODipine (NORVASC) 10 MG tablet Take 1 tablet (10 mg total) by mouth daily. 04/16/14  Yes Lance Bosch, NP  ARIPiprazole (ABILIFY) 20 MG tablet Take 20 mg by mouth at bedtime.   Yes Historical Provider, MD  furosemide (LASIX) 80 MG tablet Take 1 tablet (80 mg total) by mouth daily. 02/21/14  Yes Renella Cunas, MD  gabapentin (NEURONTIN) 300 MG capsule Take 1 capsule (300 mg total) by mouth 2 (two) times daily. 04/16/14  Yes Lance Bosch, NP  rivaroxaban (XARELTO) 20 MG TABS tablet Take 1 tablet (20 mg total) by mouth daily with supper. 04/17/14  Yes Tresa Garter, MD  solifenacin (VESICARE) 5 MG tablet Take 1 tablet (5 mg total) by mouth daily. 02/21/14  Yes Lance Bosch, NP  esomeprazole (NEXIUM) 40 MG capsule Take 40 mg by mouth daily at 12 noon.    Historical Provider, MD  fluconazole (DIFLUCAN) 150 MG tablet Take 1 tablet (150 mg total) by mouth once. Patient not taking: Reported on 10/27/2014 04/16/14   Lance Bosch, NP  metroNIDAZOLE (FLAGYL) 500 MG tablet Take 1 tablet (500 mg total) by mouth 2 (two) times daily. Patient not taking: Reported on 10/27/2014 04/16/14   Lance Bosch, NP  omeprazole (PRILOSEC) 20 MG capsule Take 1 capsule (20 mg total) by mouth daily. 10/27/14   Jasper Riling. Ardie Mclennan, MD  oxyCODONE-acetaminophen (PERCOCET) 10-325 MG per tablet Take 1-2 tablets by mouth every 4 (four) hours as needed for pain. Patient not taking: Reported on 10/27/2014 01/02/14   Charlie Pitter, MD  predniSONE (DELTASONE) 10 MG tablet Take 2 tablets (20 mg total) by mouth daily. Patient not taking: Reported on 10/27/2014 11/12/13   Linus Mako, PA-C  traMADol (ULTRAM) 50 MG tablet Take 1 tablet (50 mg total) by mouth 2 (two) times daily as needed. Patient not taking:  Reported on 10/27/2014 02/12/14   Lance Bosch, NP  triamcinolone cream (KENALOG) 0.1 % Apply 1 application topically 2 (two) times daily as needed (eczema). Do not apply to face Patient not taking: Reported on 10/27/2014 02/21/14   Lance Bosch, NP   BP 109/77 mmHg  Pulse 86  Temp(Src) 99.2 F (37.3 C) (Oral)  Resp 16  SpO2 98%  LMP 10/20/2014 Physical Exam  Constitutional: She is oriented to person, place, and time. She appears well-developed and well-nourished.  HENT:  Head: Normocephalic and atraumatic.  Eyes: EOM are normal. Pupils are equal, round, and reactive to light.  Neck: Normal range of motion. Neck supple.  Cardiovascular: Normal rate, regular rhythm and normal heart sounds.  No murmur heard. Pulmonary/Chest: Effort normal and breath sounds normal. No respiratory distress. She has no wheezes. She has no rales. She exhibits tenderness.  Severe tenderness over anterior mid chest wall. No rash. Crepitance or deformity.  Abdominal: Soft. Bowel sounds are normal. She exhibits no distension. There is no tenderness. There is no rebound and no guarding.  Musculoskeletal: Normal range of motion.  Neurological: She is alert and oriented to person, place, and time. No cranial nerve deficit.  Skin: Skin is warm and dry.  Psychiatric: She has a normal mood and affect. Her speech is normal.  Nursing note and vitals reviewed.   ED Course  Procedures (including critical care time) Labs Review Labs Reviewed  COMPREHENSIVE METABOLIC PANEL - Abnormal; Notable for the following:    Glucose, Bld 113 (*)    Albumin 3.2 (*)    GFR calc non Af Amer 86 (*)    Anion gap 4 (*)    All other components within normal limits  CBC WITH DIFFERENTIAL/PLATELET - Abnormal; Notable for the following:    Hemoglobin 11.9 (*)    Monocytes Absolute 1.2 (*)    All other components within normal limits  Randolm Idol, ED    Imaging Review Dg Chest 2 View  10/27/2014   CLINICAL DATA:  Patient  with intense stabbing chest pain.  EXAM: CHEST  2 VIEW  COMPARISON:  Chest radiograph 04/03/2014  FINDINGS: Stable cardiac and mediastinal contours. No consolidative pulmonary opacities. No pleural effusion or pneumothorax. Mid thoracic spine degenerative changes.  IMPRESSION: No acute cardiopulmonary process.   Electronically Signed   By: Lovey Newcomer M.D.   On: 10/27/2014 08:08     EKG Interpretation   Date/Time:  Saturday October 27 2014 06:36:40 EST Ventricular Rate:  90 PR Interval:  170 QRS Duration: 81 QT Interval:  392 QTC Calculation: 480 R Axis:   18 Text Interpretation:  Sinus rhythm non-specific t wave changes Confirmed  by Alvino Chapel  MD, Irania Durell [phone]) on 10/27/2014 6:58:23 AM      MDM   Final diagnoses:  Chest pain, unspecified chest pain type    Patient with chest pain. EKG reassuring. May be GERD related. Enzymes negative. Will discharge home.    Jasper Riling. Alvino Chapel, MD 10/28/14 [phone]

## 2014-10-29 NOTE — Telephone Encounter (Signed)
PATIENT NEEDS APPT FOR REFILLS

## 2014-11-18 ENCOUNTER — Emergency Department (HOSPITAL_COMMUNITY): Payer: Medicaid Other

## 2014-11-18 ENCOUNTER — Emergency Department (HOSPITAL_COMMUNITY)
Admission: EM | Admit: 2014-11-18 | Discharge: 2014-11-18 | Disposition: A | Discharge status: Home / Self Care | Payer: Medicaid Other | Attending: Emergency Medicine | Admitting: Emergency Medicine

## 2014-11-18 ENCOUNTER — Encounter (HOSPITAL_COMMUNITY): Payer: Self-pay | Admitting: *Deleted

## 2014-11-18 DIAGNOSIS — R079 Chest pain, unspecified: Secondary | ICD-10-CM | POA: Diagnosis present

## 2014-11-18 DIAGNOSIS — K219 Gastro-esophageal reflux disease without esophagitis: Secondary | ICD-10-CM | POA: Insufficient documentation

## 2014-11-18 DIAGNOSIS — M5430 Sciatica, unspecified side: Secondary | ICD-10-CM | POA: Insufficient documentation

## 2014-11-18 DIAGNOSIS — F319 Bipolar disorder, unspecified: Secondary | ICD-10-CM | POA: Insufficient documentation

## 2014-11-18 DIAGNOSIS — Z7952 Long term (current) use of systemic steroids: Secondary | ICD-10-CM | POA: Insufficient documentation

## 2014-11-18 DIAGNOSIS — Z79899 Other long term (current) drug therapy: Secondary | ICD-10-CM | POA: Diagnosis not present

## 2014-11-18 DIAGNOSIS — Z720 Tobacco use: Secondary | ICD-10-CM | POA: Insufficient documentation

## 2014-11-18 DIAGNOSIS — I10 Essential (primary) hypertension: Secondary | ICD-10-CM | POA: Diagnosis not present

## 2014-11-18 DIAGNOSIS — M1990 Unspecified osteoarthritis, unspecified site: Secondary | ICD-10-CM | POA: Insufficient documentation

## 2014-11-18 DIAGNOSIS — R0789 Other chest pain: Secondary | ICD-10-CM | POA: Diagnosis not present

## 2014-11-18 DIAGNOSIS — F419 Anxiety disorder, unspecified: Secondary | ICD-10-CM | POA: Insufficient documentation

## 2014-11-18 DIAGNOSIS — I509 Heart failure, unspecified: Secondary | ICD-10-CM | POA: Insufficient documentation

## 2014-11-18 DIAGNOSIS — I209 Angina pectoris, unspecified: Secondary | ICD-10-CM | POA: Insufficient documentation

## 2014-11-18 DIAGNOSIS — Z7901 Long term (current) use of anticoagulants: Secondary | ICD-10-CM | POA: Insufficient documentation

## 2014-11-18 DIAGNOSIS — J45909 Unspecified asthma, uncomplicated: Secondary | ICD-10-CM | POA: Insufficient documentation

## 2014-11-18 DIAGNOSIS — R011 Cardiac murmur, unspecified: Secondary | ICD-10-CM | POA: Diagnosis not present

## 2014-11-18 DIAGNOSIS — F209 Schizophrenia, unspecified: Secondary | ICD-10-CM | POA: Diagnosis not present

## 2014-11-18 LAB — CBC
HCT: 37.5 % (ref 36.0–46.0)
Hemoglobin: 12.5 g/dL (ref 12.0–15.0)
MCH: 29.5 pg (ref 26.0–34.0)
MCHC: 33.3 g/dL (ref 30.0–36.0)
MCV: 88.4 fL (ref 78.0–100.0)
Platelets: 299 10*3/uL (ref 150–400)
RBC: 4.24 MIL/uL (ref 3.87–5.11)
RDW: 13.6 % (ref 11.5–15.5)
WBC: 9.4 10*3/uL (ref 4.0–10.5)

## 2014-11-18 LAB — BASIC METABOLIC PANEL
Anion gap: 8 (ref 5–15)
BUN: 12 mg/dL (ref 6–23)
CO2: 24 mmol/L (ref 19–32)
Calcium: 8.8 mg/dL (ref 8.4–10.5)
Chloride: 107 mmol/L (ref 96–112)
Creatinine, Ser: 0.9 mg/dL (ref 0.50–1.10)
GFR calc Af Amer: 89 mL/min — ABNORMAL LOW (ref >90)
GFR calc non Af Amer: 77 mL/min — ABNORMAL LOW (ref >90)
Glucose, Bld: 120 mg/dL — ABNORMAL HIGH (ref 70–99)
Potassium: 3.5 mmol/L (ref 3.5–5.1)
Sodium: 139 mmol/L (ref 135–145)

## 2014-11-18 LAB — BRAIN NATRIURETIC PEPTIDE: B Natriuretic Peptide: 31.2 pg/mL (ref 0.0–100.0)

## 2014-11-18 LAB — I-STAT TROPONIN, ED: Troponin i, poc: 0.01 ng/mL (ref 0.00–0.08)

## 2014-11-18 MED ORDER — TRAMADOL HCL 50 MG PO TABS
50.0000 mg | ORAL_TABLET | Freq: Once | ORAL | Status: AC
  Administered 2014-11-18: 50 mg via ORAL
  Filled 2014-11-18: qty 1

## 2014-11-18 NOTE — ED Provider Notes (Signed)
CSN: [id number]     Arrival date & time 11/18/14  1037 History   First MD Initiated Contact with Patient 11/18/14 1054     Chief Complaint  Patient presents with  . Chest Pain    HPI Comments: 44 YO female presenting with right sided chest wall pain for 1 week. Pt reports that she was recently seen (10/27/13) for similar pain. She was treated with Nexium for suspected GERD and reports some relief of symptoms. She notes a return of pain in the same area but worse than before. She describes it as severe, persistent, and sharp, made worse with palpation or deep respirations. She states that she had tramadol at home and found some relief with it. Denies fever, nausea, vomiting, abdominal pain. Notes a history of CHF with assocaited orthopnea, SOB and LLE at baseline with no increase.  Patient is a 44 y.o. female presenting with chest pain.  Chest Pain   Past Medical History  Diagnosis Date  . Asthma   . Hypertension   . Anxiety   . Snoring disorder     Pt stated my boyfriend always wakes me up and tells me to breathe  . Heart murmur   . CHF (congestive heart failure)   . Shortness of breath   . Bronchitis     hx of  . Bipolar affective disorder     Schizophrenia  . Depression   . GERD (gastroesophageal reflux disease)   . Arthritis   . Overactive bladder   . Schizophrenia   . Sciatica   . Anginal pain     occ from asthma   Past Surgical History  Procedure Laterality Date  . Rotator cuff repair      Right shoulder  . Tubal ligation    . Gallstones reomved    . Lumbar laminectomy/decompression microdiscectomy  04/01/2012    Procedure: LUMBAR LAMINECTOMY/DECOMPRESSION MICRODISCECTOMY 2 LEVELS;  Surgeon: Faythe Ghee, MD;  Location: [facility];  Service: Neurosurgery;  Laterality: Left;  Lumbar four-five, lumbar five sacral one microdiscectomy   . Lumbar wound debridement  04/29/2012    Procedure: LUMBAR WOUND DEBRIDEMENT;  Surgeon: Faythe Ghee, MD;  Location: [facility];   Service: Neurosurgery;  Laterality: N/A;  lumbar wound debridement  . Cholecystectomy    . Eye surgery      Metal plate in right eye  . Back surgery      3 back surgeries   Family History  Problem Relation Age of Onset  . Diabetes Mother   . Diabetes Father   . Heart disease Paternal Aunt   . Cancer Paternal Aunt    History  Substance Use Topics  . Smoking status: Current Every Day Smoker -- 0.25 packs/day for 9 years    Types: Cigarettes  . Smokeless tobacco: Never Used  . Alcohol Use: No     Comment: quit Nov. 2014   OB History    No data available     Review of Systems  Cardiovascular: Positive for chest pain.      Allergies  Aspirin  Home Medications   Prior to Admission medications   Medication Sig Start Date End Date Taking? Authorizing Provider  Albuterol Sulfate (PROAIR RESPICLICK) 921 (90 BASE) MCG/ACT AEPB Inhale 2 puffs into the lungs every 4 (four) hours as needed (Every 4-6 hours as needed for wheezing). 05/10/14  Yes Lance Bosch, NP  amLODipine (NORVASC) 10 MG tablet Take 1 tablet (10 mg total) by mouth daily. 04/16/14  Yes Lance Bosch, NP  ARIPiprazole (ABILIFY) 20 MG tablet Take 20 mg by mouth at bedtime.   Yes Historical Provider, MD  esomeprazole (NEXIUM) 40 MG capsule Take 40 mg by mouth daily at 12 noon.   Yes Historical Provider, MD  furosemide (LASIX) 80 MG tablet TAKE ONE (1) TABLET BY MOUTH EVERY DAY 10/29/14  Yes Lendon Colonel, NP  gabapentin (NEURONTIN) 300 MG capsule Take 1 capsule (300 mg total) by mouth 2 (two) times daily. 04/16/14  Yes Lance Bosch, NP  omeprazole (PRILOSEC) 20 MG capsule Take 1 capsule (20 mg total) by mouth daily. 10/27/14  Yes Jasper Riling. Alvino Chapel, MD  rivaroxaban (XARELTO) 20 MG TABS tablet Take 1 tablet (20 mg total) by mouth daily with supper. 04/17/14  Yes Tresa Garter, MD  solifenacin (VESICARE) 5 MG tablet Take 1 tablet (5 mg total) by mouth daily. 02/21/14  Yes Lance Bosch, NP  fluconazole  (DIFLUCAN) 150 MG tablet Take 1 tablet (150 mg total) by mouth once. Patient not taking: Reported on 10/27/2014 04/16/14   Lance Bosch, NP  metroNIDAZOLE (FLAGYL) 500 MG tablet Take 1 tablet (500 mg total) by mouth 2 (two) times daily. Patient not taking: Reported on 10/27/2014 04/16/14   Lance Bosch, NP  oxyCODONE-acetaminophen (PERCOCET) 10-325 MG per tablet Take 1-2 tablets by mouth every 4 (four) hours as needed for pain. Patient not taking: Reported on 10/27/2014 01/02/14   Charlie Pitter, MD  predniSONE (DELTASONE) 10 MG tablet Take 2 tablets (20 mg total) by mouth daily. Patient not taking: Reported on 10/27/2014 11/12/13   Linus Mako, PA-C  traMADol (ULTRAM) 50 MG tablet Take 1 tablet (50 mg total) by mouth 2 (two) times daily as needed. Patient not taking: Reported on 10/27/2014 02/12/14   Lance Bosch, NP  triamcinolone cream (KENALOG) 0.1 % Apply 1 application topically 2 (two) times daily as needed (eczema). Do not apply to face Patient not taking: Reported on 10/27/2014 02/21/14   Lance Bosch, NP   BP 152/96 mmHg  Pulse 80  Temp(Src) 98.2 F (36.8 C) (Oral)  Resp 18  Ht 5\' 2"  (1.575 m)  Wt 222 lb 14.4 oz (101.107 kg)  BMI 40.76 kg/m2  SpO2 98%  LMP 11/12/2014 Physical Exam  Constitutional: She is oriented to person, place, and time. She appears well-developed and well-nourished.  HENT:  Head: Normocephalic and atraumatic.  Eyes: Conjunctivae are normal. Pupils are equal, round, and reactive to light. Right eye exhibits no discharge. Left eye exhibits no discharge. No scleral icterus.  Neck: Normal range of motion. Neck supple. No JVD present. No tracheal deviation present.  Cardiovascular: Normal rate, regular rhythm and intact distal pulses.  Exam reveals no gallop and no friction rub.   No murmur heard. Pulmonary/Chest: Effort normal and breath sounds normal. No stridor. No respiratory distress. She has no wheezes. She has no rales. She exhibits tenderness.   Musculoskeletal: Normal range of motion.  Neurological: She is alert and oriented to person, place, and time. Coordination normal.  Skin: Skin is warm and dry.  Psychiatric: She has a normal mood and affect. Her behavior is normal. Judgment and thought content normal.  Nursing note and vitals reviewed.   ED Course  Procedures (including critical care time) Labs Review Labs Reviewed  BASIC METABOLIC PANEL - Abnormal; Notable for the following:    Glucose, Bld 120 (*)    GFR calc non Af Amer 77 (*)    GFR  calc Af Amer 89 (*)    All other components within normal limits  CBC  BRAIN NATRIURETIC PEPTIDE  I-STAT TROPOININ, ED   Imaging Review Dg Chest 2 View  11/18/2014   CLINICAL DATA:  Constipated for 3 days. chest pain and shortness of breath. Asthma and hypertension.  EXAM: CHEST  2 VIEW  COMPARISON:  10/27/2014  FINDINGS: Moderate mid and lower thoracic spondylosis. Midline trachea. Normal heart size and mediastinal contours. No pleural effusion or pneumothorax. Clear lungs.  IMPRESSION: No acute cardiopulmonary disease.   Electronically Signed   By: Abigail Miyamoto M.D.   On: 11/18/2014 12:59     EKG Interpretation   Date/Time:  Sunday November 18 2014 10:40:46 EST Ventricular Rate:  81 PR Interval:  164 QRS Duration: 90 QT Interval:  384 QTC Calculation: 446 R Axis:   32 Text Interpretation:  Normal sinus rhythm Possible Anteroseptal infarct ,  age undetermined Abnormal ECG No significant change since last tracing  Confirmed by Debby Freiberg [phone]) on 11/18/2014 12:04:13 PM     MDM   Final diagnoses:  Chest wall pain    Labs: I-stat troponin negative, CBC normal, BMP normal   Imaging: EKG no significant findings   Therapeutics: Ultram; pain reduced after administration   Assessment: Chest wall pain not likely cardiac in nature due to duration, troponin, and EKG. Pt's lack of risk factors and clinical presentation make PE unlikely. No history of trauma.   Plan: Pt  discharged home with instructions to take Ibuprofen 400 mg 3-4 times per day as needed for pain. Also instructed to return if new or worsening symptoms present. Pt understood and agreed to plan.     Okey Regal, PA-C 11/19/14 1608  Debby Freiberg, MD 11/21/14 2138

## 2014-11-18 NOTE — ED Notes (Signed)
Patient transported to X-ray 

## 2014-11-18 NOTE — ED Notes (Addendum)
Pt called out asking to go to restroom, pt unhooked from monitor and returned to room, got herself dressed and left. Will make EDP aware.

## 2014-11-18 NOTE — ED Notes (Signed)
Pt remains monitored by blood pressure, pulse ox, and 5 lead.  

## 2014-11-18 NOTE — ED Notes (Signed)
Pt reports mid chest pain x 1 week, pain increases with movement and palpation. Also having swelling to legs and hx of chf.

## 2014-11-18 NOTE — Discharge Instructions (Signed)

## 2015-01-15 ENCOUNTER — Other Ambulatory Visit: Payer: Self-pay

## 2015-01-16 ENCOUNTER — Other Ambulatory Visit: Payer: Self-pay | Admitting: Internal Medicine

## 2015-01-22 NOTE — Telephone Encounter (Signed)
Patient is overdue for 3 month f/u with PCP (Due 07/2014) Will refill for 30 days and then patient will need to schedule f/u to receive additional refills

## 2015-02-11 IMAGING — CR DG LUMBAR SPINE COMPLETE 4+V
5 series · 5 of 5 positions shown · non-contrast
Comparison: 07/12/2012 radiographs

CLINICAL DATA: Low back pain following injury.

LUMBAR SPINE - COMPLETE 4+ VIEW

[t lumbar spine ap]
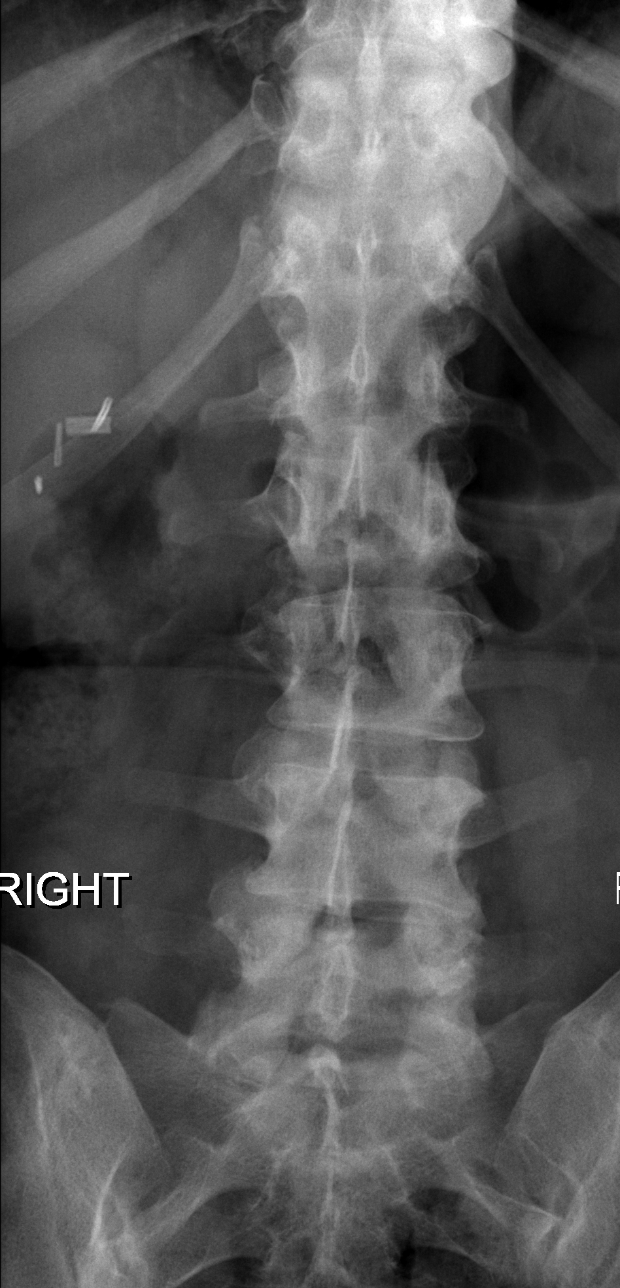

[t lumbar spine obl (1 of 2)]
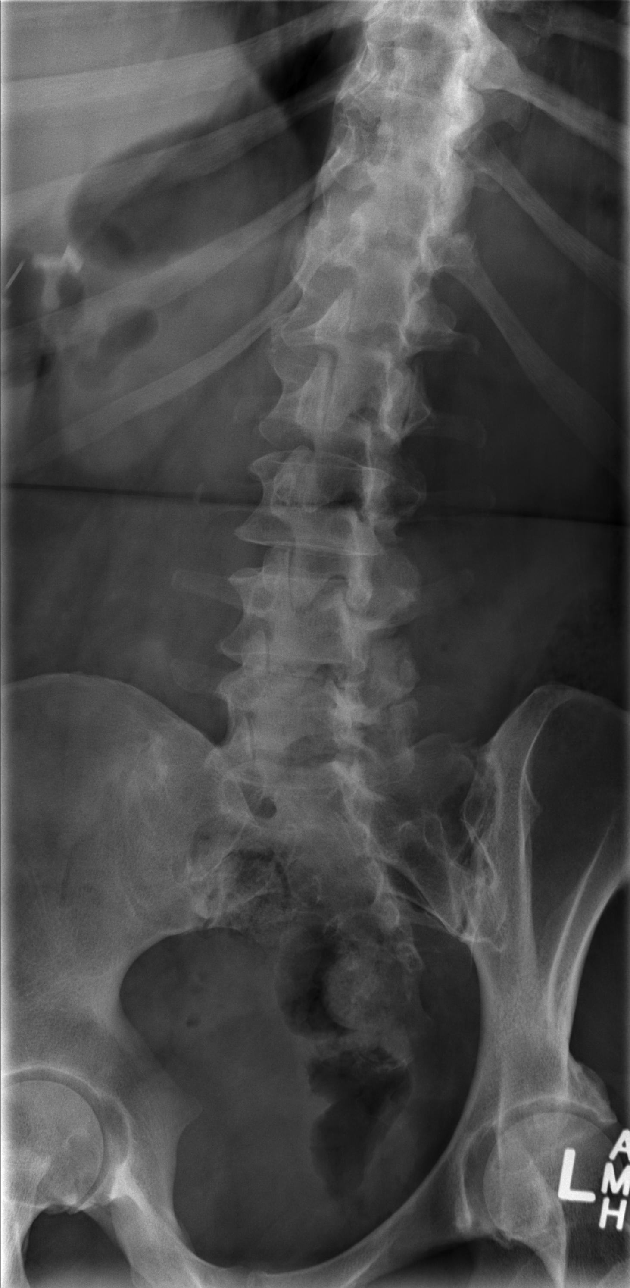

[t lumbar spine obl (2 of 2)]
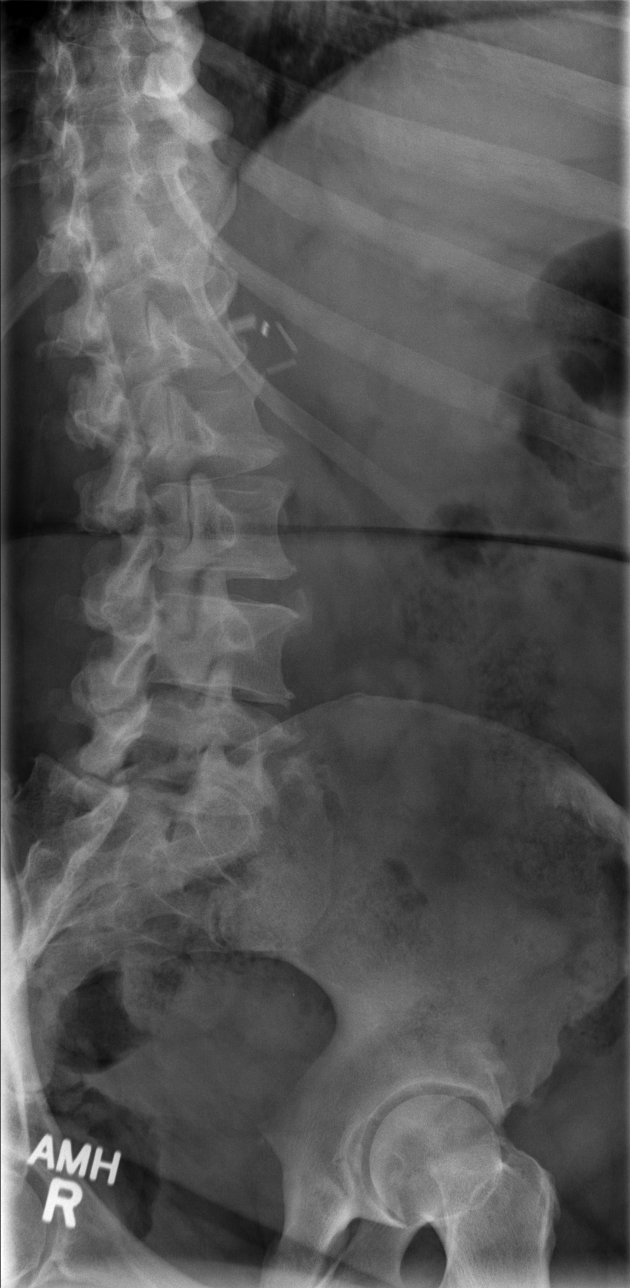

[t lumbar spine lat]
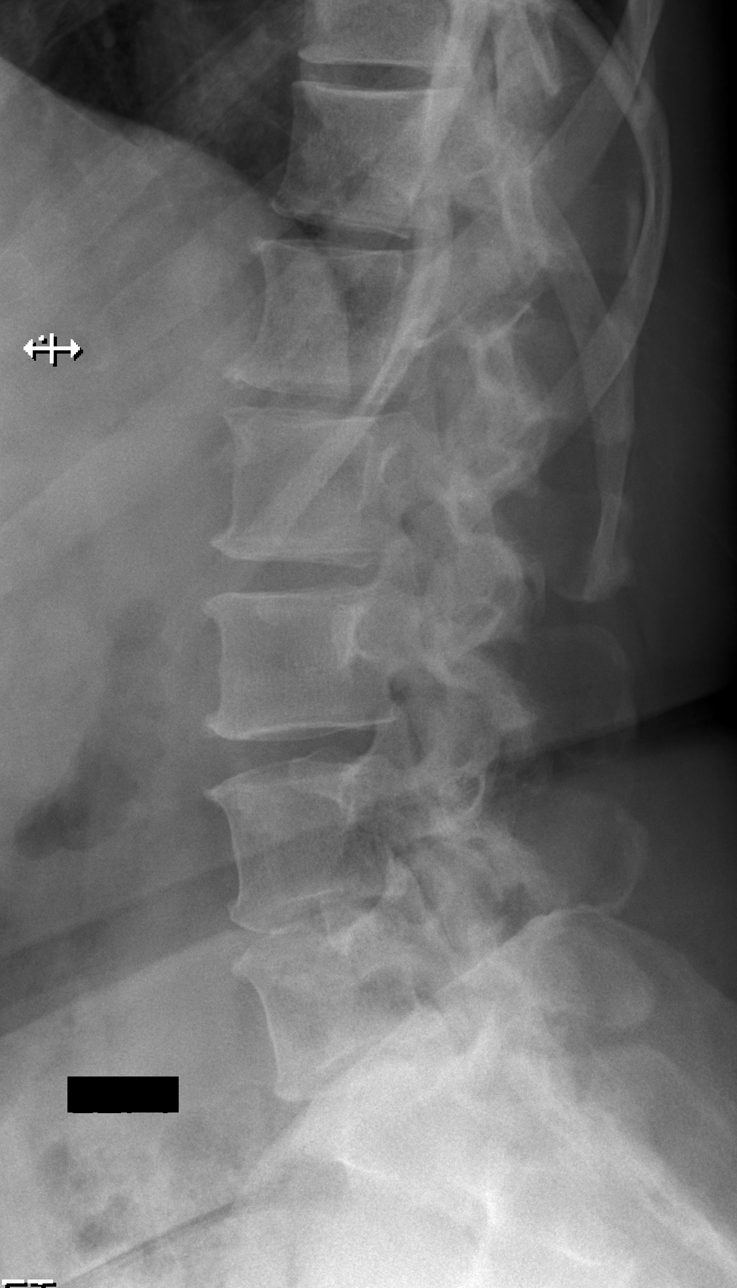

[t lumbar l-5 s-1 spot]
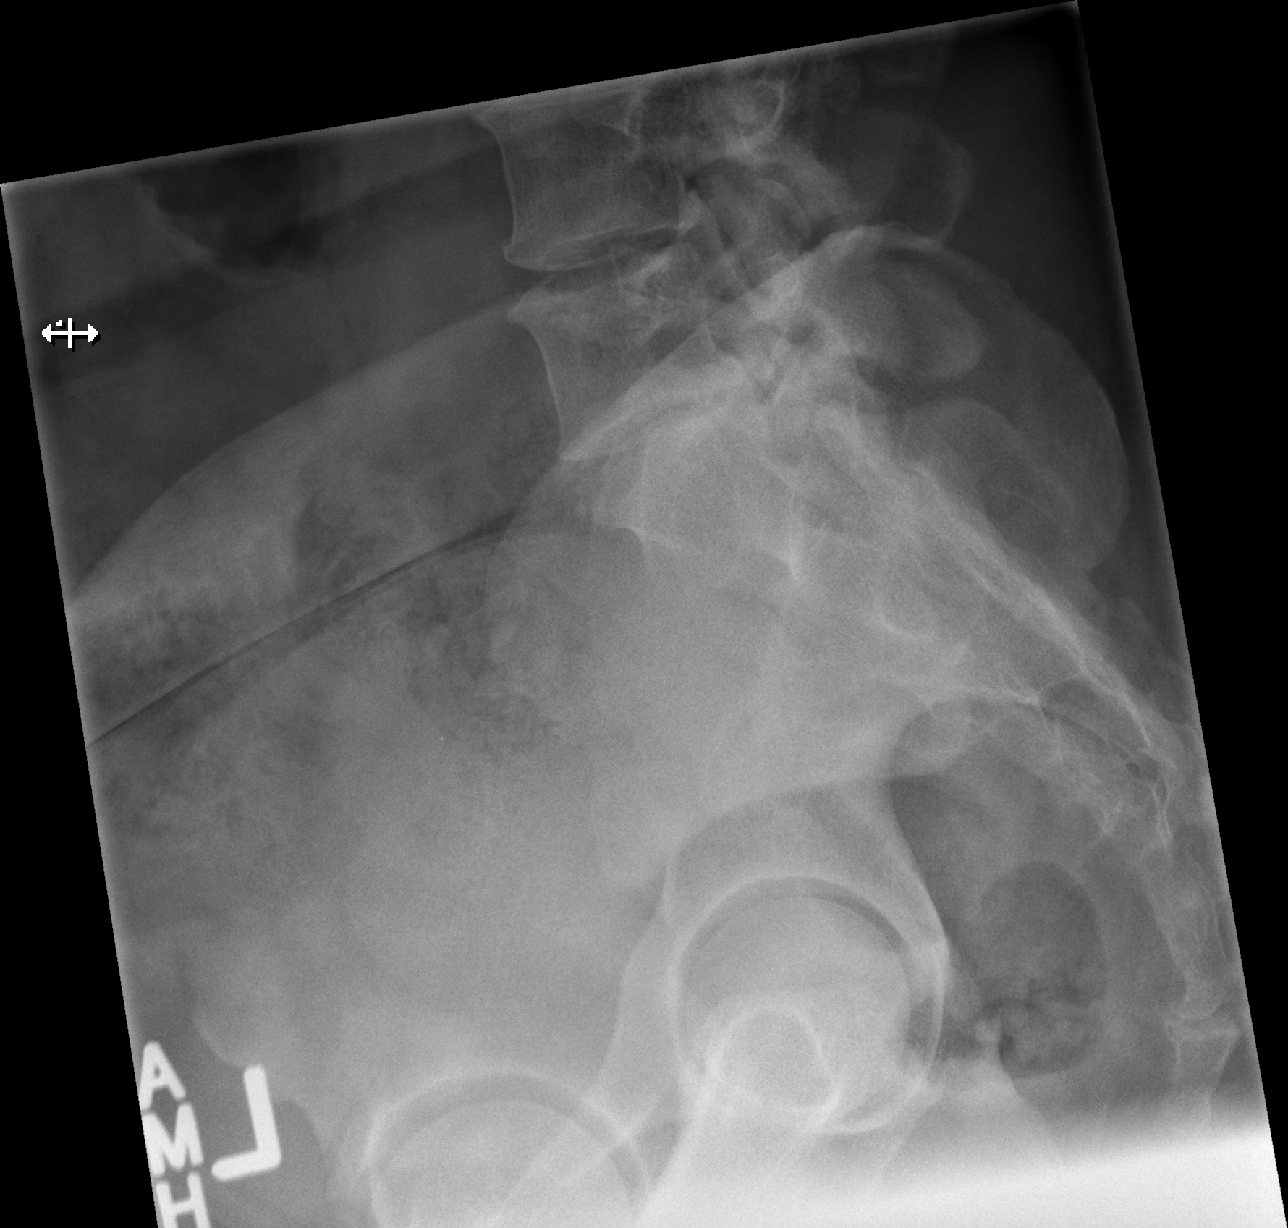

[5 of 5 positions shown; findings below may reference images not displayed]

FINDINGS: Five non-rib bearing lumbar type vertebra are again
identified in normal alignment.
There is no evidence of fracture or subluxation.
Mild multilevel degenerative disc disease and spondylosis
identified.
Moderate facet arthropathy throughout the lumbar spine noted.
No focal bony lesions or spondylolysis identified.
IMPRESSION: No evidence of acute abnormality.

Multilevel degenerative changes.

## 2015-02-11 IMAGING — CR DG KNEE COMPLETE 4+V*L*
4 series · 4 of 4 positions shown · non-contrast
Comparison: 06/15/2012 and prior radiographs dating back to 7448.

CLINICAL DATA: 41-year-old female with left knee injury and pain.

LEFT KNEE - COMPLETE 4+ VIEW

[t knee lat left]
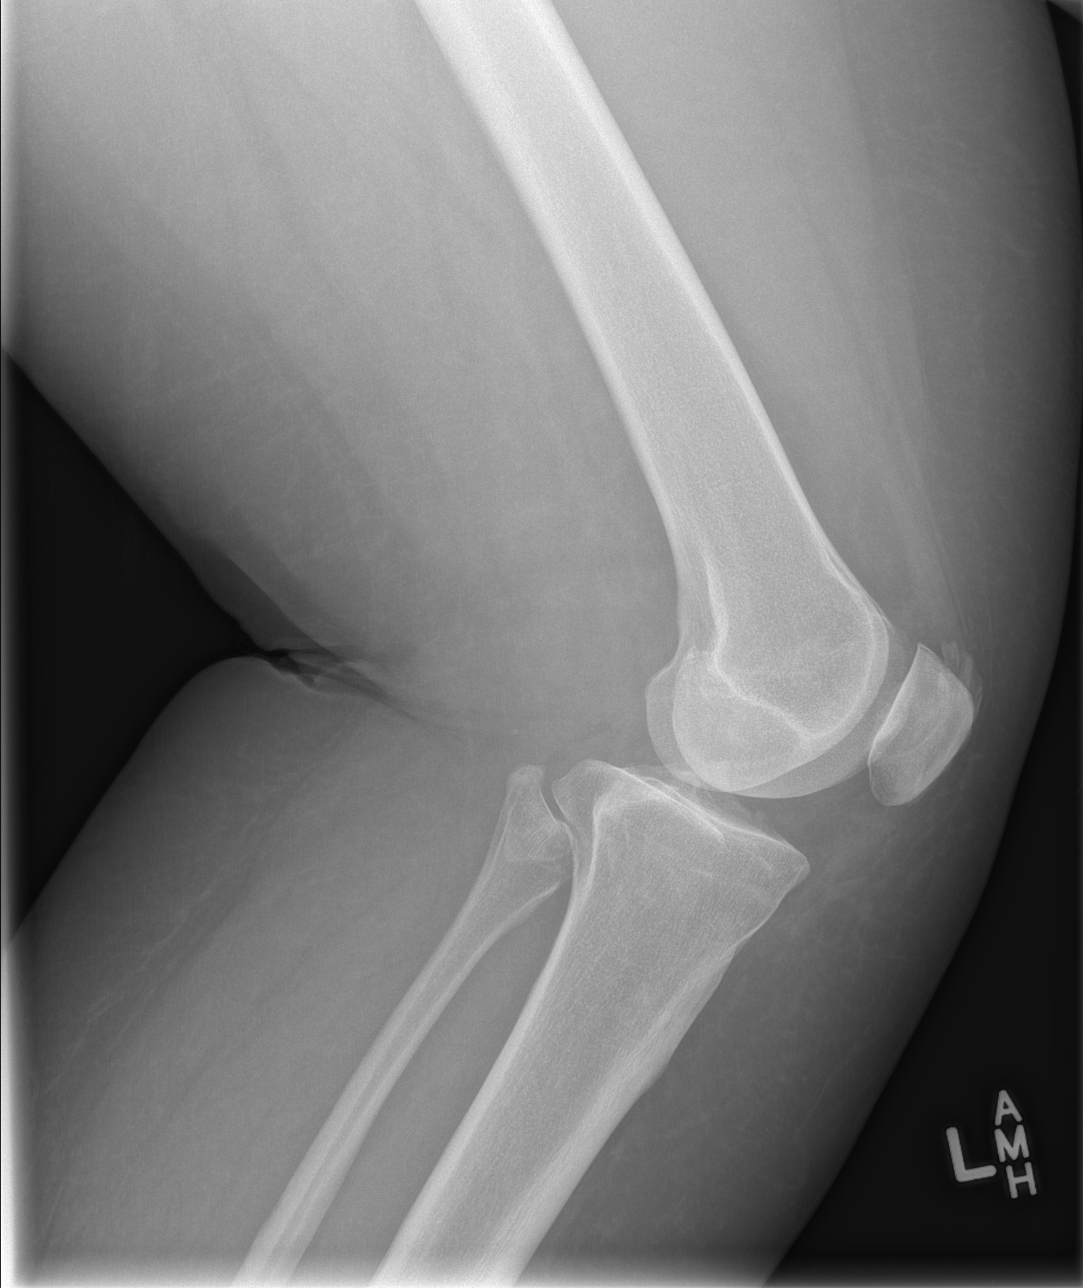

[t knee ap left]
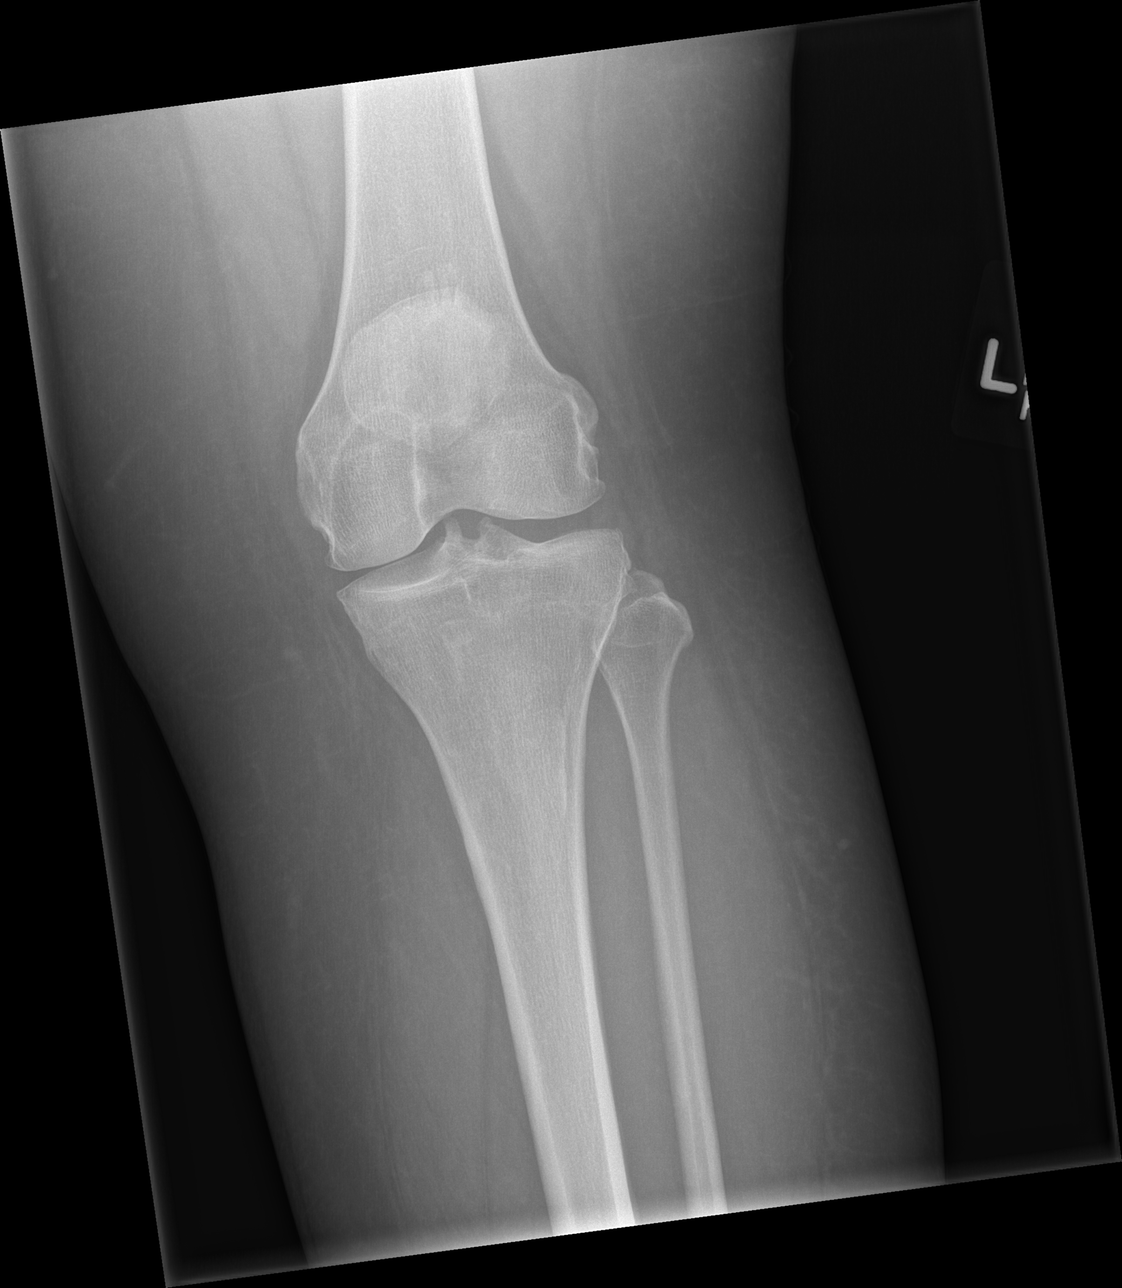

[t knee obl left (1 of 2)]
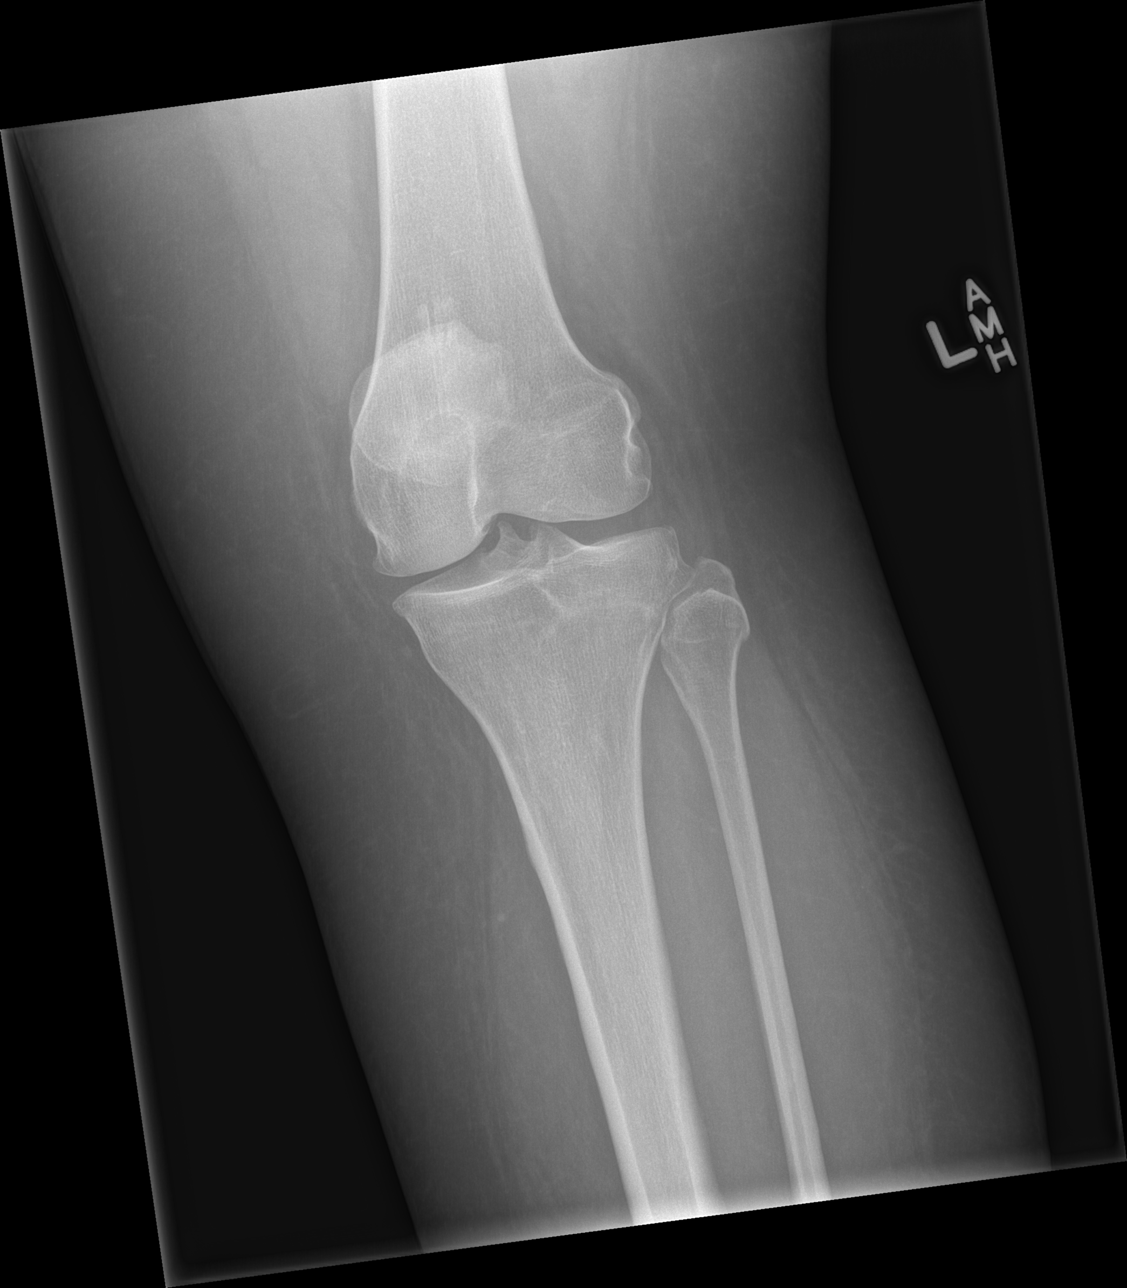

[t knee obl left (2 of 2)]
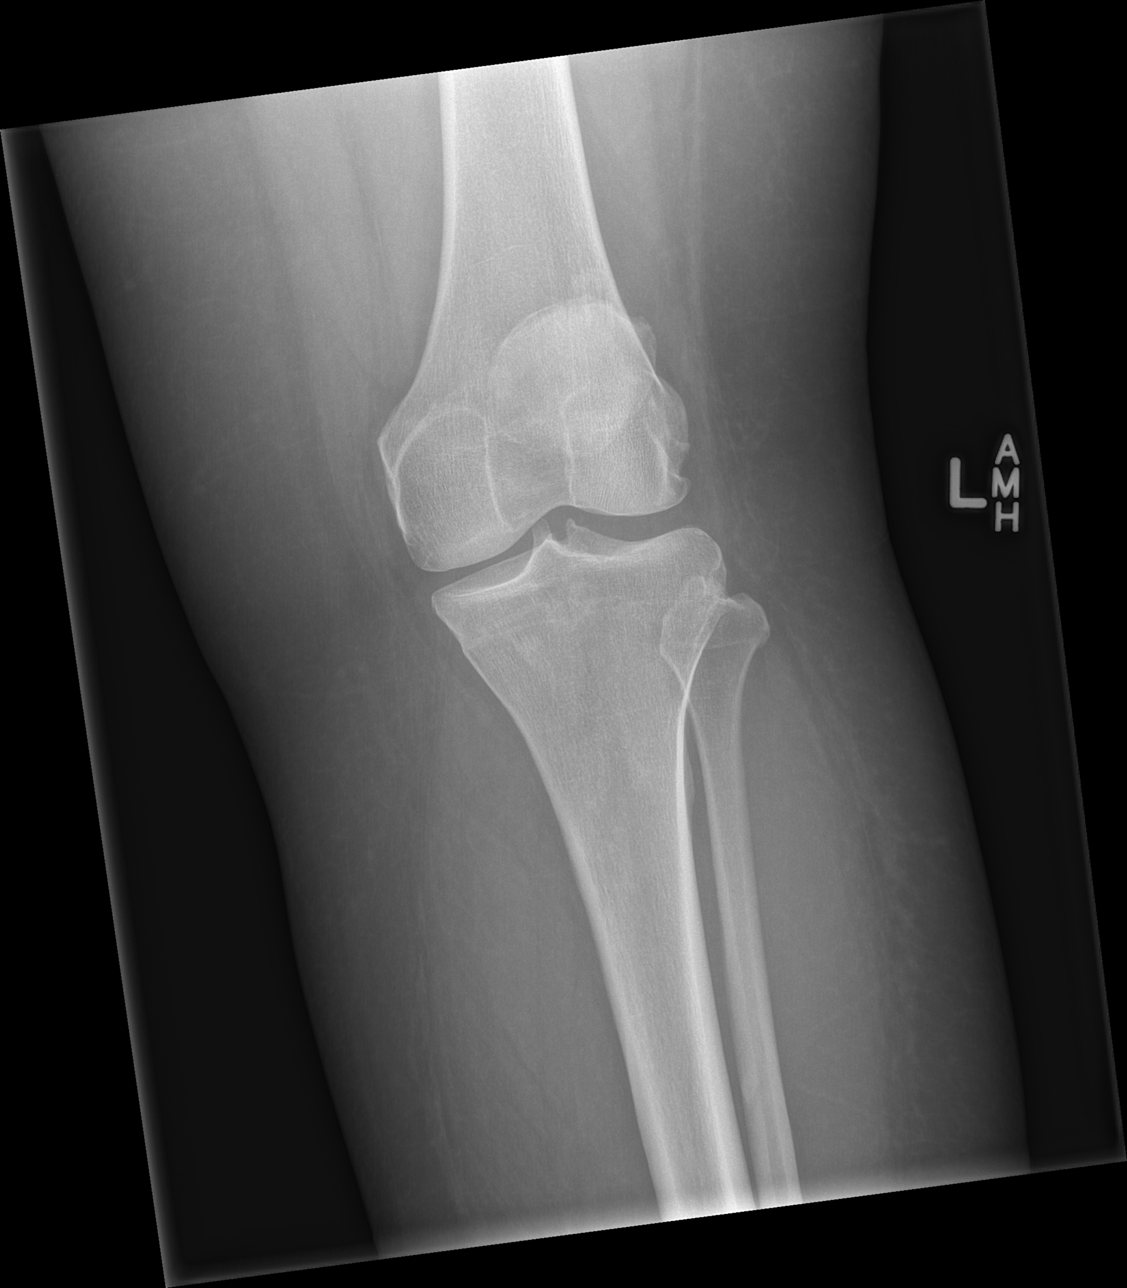

[4 of 4 positions shown; findings below may reference images not displayed]

FINDINGS: There is no evidence of acute fracture, subluxation or
dislocation.
There is no evidence of joint effusion.
Mild tricompartmental degenerative changes are present.
No focal bony lesions are identified.
IMPRESSION: No evidence of acute bony abnormality.

## 2015-02-20 ENCOUNTER — Other Ambulatory Visit: Payer: Self-pay | Admitting: Internal Medicine

## 2015-03-04 ENCOUNTER — Ambulatory Visit: Payer: Medicaid Other | Attending: Internal Medicine | Admitting: Internal Medicine

## 2015-03-04 VITALS — BP 147/71 | HR 77 | Wt 224.5 lb

## 2015-03-04 DIAGNOSIS — Z86718 Personal history of other venous thrombosis and embolism: Secondary | ICD-10-CM | POA: Insufficient documentation

## 2015-03-04 DIAGNOSIS — M549 Dorsalgia, unspecified: Secondary | ICD-10-CM | POA: Insufficient documentation

## 2015-03-04 DIAGNOSIS — N3281 Overactive bladder: Secondary | ICD-10-CM | POA: Insufficient documentation

## 2015-03-04 DIAGNOSIS — Z0000 Encounter for general adult medical examination without abnormal findings: Secondary | ICD-10-CM | POA: Diagnosis present

## 2015-03-04 DIAGNOSIS — Z202 Contact with and (suspected) exposure to infections with a predominantly sexual mode of transmission: Secondary | ICD-10-CM | POA: Diagnosis not present

## 2015-03-04 DIAGNOSIS — F17210 Nicotine dependence, cigarettes, uncomplicated: Secondary | ICD-10-CM | POA: Insufficient documentation

## 2015-03-04 DIAGNOSIS — F319 Bipolar disorder, unspecified: Secondary | ICD-10-CM | POA: Diagnosis not present

## 2015-03-04 DIAGNOSIS — Z7901 Long term (current) use of anticoagulants: Secondary | ICD-10-CM | POA: Insufficient documentation

## 2015-03-04 DIAGNOSIS — I10 Essential (primary) hypertension: Secondary | ICD-10-CM | POA: Insufficient documentation

## 2015-03-04 DIAGNOSIS — Z79899 Other long term (current) drug therapy: Secondary | ICD-10-CM | POA: Diagnosis not present

## 2015-03-04 DIAGNOSIS — N898 Other specified noninflammatory disorders of vagina: Secondary | ICD-10-CM | POA: Insufficient documentation

## 2015-03-04 DIAGNOSIS — K439 Ventral hernia without obstruction or gangrene: Secondary | ICD-10-CM | POA: Diagnosis not present

## 2015-03-04 DIAGNOSIS — Z720 Tobacco use: Secondary | ICD-10-CM

## 2015-03-04 DIAGNOSIS — J4520 Mild intermittent asthma, uncomplicated: Secondary | ICD-10-CM | POA: Diagnosis not present

## 2015-03-04 DIAGNOSIS — I509 Heart failure, unspecified: Secondary | ICD-10-CM | POA: Diagnosis not present

## 2015-03-04 DIAGNOSIS — K219 Gastro-esophageal reflux disease without esophagitis: Secondary | ICD-10-CM | POA: Diagnosis not present

## 2015-03-04 DIAGNOSIS — W19XXXA Unspecified fall, initial encounter: Secondary | ICD-10-CM | POA: Insufficient documentation

## 2015-03-04 DIAGNOSIS — R609 Edema, unspecified: Secondary | ICD-10-CM | POA: Diagnosis not present

## 2015-03-04 DIAGNOSIS — F209 Schizophrenia, unspecified: Secondary | ICD-10-CM | POA: Insufficient documentation

## 2015-03-04 LAB — CBC
HCT: 38.5 % (ref 36.0–46.0)
Hemoglobin: 12.7 g/dL (ref 12.0–15.0)
MCH: 28.9 pg (ref 26.0–34.0)
MCHC: 33 g/dL (ref 30.0–36.0)
MCV: 87.7 fL (ref 78.0–100.0)
MPV: 11 fL (ref 8.6–12.4)
Platelets: 306 10*3/uL (ref 150–400)
RBC: 4.39 MIL/uL (ref 3.87–5.11)
RDW: 14.3 % (ref 11.5–15.5)
WBC: 8.6 10*3/uL (ref 4.0–10.5)

## 2015-03-04 MED ORDER — FUROSEMIDE 80 MG PO TABS: ORAL_TABLET | ORAL | Status: DC

## 2015-03-04 MED ORDER — ARIPIPRAZOLE 20 MG PO TABS: 20.0000 mg | ORAL_TABLET | Freq: Every day | ORAL | Status: DC

## 2015-03-04 MED ORDER — METRONIDAZOLE 500 MG PO TABS
2000.0000 mg | ORAL_TABLET | Freq: Once | ORAL | Status: DC
Start: 2015-03-04 — End: 2015-03-04

## 2015-03-04 MED ORDER — OMEPRAZOLE 20 MG PO CPDR: 20.0000 mg | DELAYED_RELEASE_CAPSULE | Freq: Every day | ORAL | Status: DC

## 2015-03-04 MED ORDER — METRONIDAZOLE 500 MG PO TABS: 2000.0000 mg | ORAL_TABLET | Freq: Once | ORAL | Status: DC

## 2015-03-04 MED ORDER — ALBUTEROL SULFATE 108 (90 BASE) MCG/ACT IN AEPB
2.0000 | INHALATION_SPRAY | RESPIRATORY_TRACT | Status: DC | PRN
Start: 2015-03-04 — End: 2015-11-07

## 2015-03-04 MED ORDER — AMLODIPINE BESYLATE 10 MG PO TABS: ORAL_TABLET | ORAL | Status: DC

## 2015-03-04 NOTE — Progress Notes (Signed)
  Pt present for a physical today. She would like to be check for diabetes. She also has some irregular  Menstrual cycle for a couple month and states she has an boil on her vagina area and under her left arm. Pt would like refills on her medicine.

## 2015-03-04 NOTE — Progress Notes (Signed)
Patient ID: Lori Bean, female   DOB: 1971-01-06, 44 y.o.   MRN: [id number]  CC: annual physical exam   HPI: Lori Bean is a 44 y.o. female here today for a follow up visit.  Patient has past medical history of HTN, CHF, bipolar disorder, depression, schizophrenia. Patient reports that she lost her insurance last year and was unable to be seen. She has several complaints today.   1. Possible STD exposure. She reports that she has been having unprotected sex with her partner of 9 years and recently noticed a vaginal odor and creamy discharge. She notes that she has had trichomonas in the past and this feels similar. She has vaginal irritation after intercourse. She is concerned about a possible abscess in her vaginal area.   2.She has a history of CHF and continues to have ankle edema although she takes lasix 80 mg daily. She has not been seen by a cardiologist since being discharged from the hospital last year. She notes that she fell last week due to losing her balance and is concerned if that may have caused some swelling.  She also reports increased SOB.   3. History of DVT last year and has been taking her Xarelto since then. In May 2015 she was found to have a thrombosed left popliteal, posterior tibial, and peroneal veins after having back surgery. She later had a doppler study in July that showed resolution of DVT's. She would like to know if she should continue medication.   4. Patient reports that when she lays down at night she has increased pain in her epigastric region. Patient reports a bulging mass in her epigastric area for over one year that has never been evaluated.    Allergies  Allergen Reactions  . Aspirin Nausea And Vomiting    'It makes me throw up."   Past Medical History  Diagnosis Date  . Asthma   . Hypertension   . Anxiety   . Snoring disorder     Pt stated my boyfriend always wakes me up and tells me to breathe  . Heart murmur   . CHF  (congestive heart failure)   . Shortness of breath   . Bronchitis     hx of  . Bipolar affective disorder     Schizophrenia  . Depression   . GERD (gastroesophageal reflux disease)   . Arthritis   . Overactive bladder   . Schizophrenia   . Sciatica   . Anginal pain     occ from asthma   Current Outpatient Prescriptions on File Prior to Visit  Medication Sig Dispense Refill  . Albuterol Sulfate (PROAIR RESPICLICK) 409 (90 BASE) MCG/ACT AEPB Inhale 2 puffs into the lungs every 4 (four) hours as needed (Every 4-6 hours as needed for wheezing). 1 each 3  . amLODipine (NORVASC) 10 MG tablet TAKE ONE (1) TABLET BY MOUTH EVERY DAY 30 tablet 0  . ARIPiprazole (ABILIFY) 20 MG tablet Take 20 mg by mouth at bedtime.    [name] esomeprazole (NEXIUM) 40 MG capsule Take 40 mg by mouth daily at 12 noon.    . furosemide (LASIX) 80 MG tablet TAKE ONE (1) TABLET BY MOUTH EVERY DAY 30 tablet 6  . gabapentin (NEURONTIN) 300 MG capsule TAKE ONE (1) CAPSULE BY MOUTH 2 TIMES DAILY 60 capsule 0  . omeprazole (PRILOSEC) 20 MG capsule TAKE ONE CAPSULE BY MOUTH DAILY 30 capsule 0  . oxyCODONE-acetaminophen (PERCOCET) 10-325 MG per tablet Take 1-2 tablets by mouth  every 4 (four) hours as needed for pain. 90 tablet 0  . rivaroxaban (XARELTO) 20 MG TABS tablet Take 1 tablet (20 mg total) by mouth daily with supper. 90 tablet 3  . solifenacin (VESICARE) 5 MG tablet Take 1 tablet (5 mg total) by mouth daily. 30 tablet 1  . traMADol (ULTRAM) 50 MG tablet Take 1 tablet (50 mg total) by mouth 2 (two) times daily as needed. 60 tablet 0  . triamcinolone cream (KENALOG) 0.1 % Apply 1 application topically 2 (two) times daily as needed (eczema). Do not apply to face 30 g 2  . [DISCONTINUED] solifenacin (VESICARE) 5 MG tablet Take 5 mg by mouth daily.     No current facility-administered medications on file prior to visit.   Family History  Problem Relation Age of Onset  . Diabetes Mother   . Diabetes Father   . Heart  disease Paternal Aunt   . Cancer Paternal Aunt    History   Social History  . Marital Status: Divorced    Spouse Name: N/A  . Number of Children: N/A  . Years of Education: N/A   Occupational History  . Not on file.   Social History Main Topics  . Smoking status: Current Every Day Smoker -- 0.25 packs/day for 9 years    Types: Cigarettes  . Smokeless tobacco: Never Used  . Alcohol Use: No     Comment: quit Nov. 2014  . Drug Use: No  . Sexual Activity: Not Currently   Other Topics Concern  . Not on file   Social History Narrative    Review of Systems: See HPI   Objective:   Filed Vitals:   03/04/15 1617  BP: 147/71  Pulse: 77    Physical Exam  Constitutional: She is oriented to person, place, and time.  Cardiovascular: Normal rate, regular rhythm and normal heart sounds.   Pulmonary/Chest: Effort normal and breath sounds normal. She has no wheezes. She has no rales.  Abdominal: Bowel sounds are normal. She exhibits distension. There is tenderness. There is no rebound and no guarding. A hernia (epigastric) is present.  Neurological: She is alert and oriented to person, place, and time.  Skin: Skin is warm and dry.     Lab Results  Component Value Date   WBC 9.4 11/18/2014   HGB 12.5 11/18/2014   HCT 37.5 11/18/2014   MCV 88.4 11/18/2014   PLT 299 11/18/2014   Lab Results  Component Value Date   CREATININE 0.90 11/18/2014   BUN 12 11/18/2014   NA 139 11/18/2014   K 3.5 11/18/2014   CL 107 11/18/2014   CO2 24 11/18/2014    Lab Results  Component Value Date   HGBA1C 6.0* 04/03/2014   Lipid Panel     Component Value Date/Time   CHOL  07/31/2009 0740    152        ATP III CLASSIFICATION:  <200     mg/dL   Desirable  200-239  mg/dL   Borderline High  >=240    mg/dL   High          TRIG 51 07/31/2009 0740   HDL 62 07/31/2009 0740   CHOLHDL 2.5 07/31/2009 0740   VLDL 10 07/31/2009 0740   LDLCALC  07/31/2009 0740    80        Total  Cholesterol/HDL:CHD Risk Coronary Heart Disease Risk Table  Men   Women  1/2 Average Risk   3.4   3.3  Average Risk       5.0   4.4  2 X Average Risk   9.6   7.1  3 X Average Risk  23.4   11.0        Use the calculated Patient Ratio above and the CHD Risk Table to determine the patient's CHD Risk.        ATP III CLASSIFICATION (LDL):  <100     mg/dL   Optimal  100-129  mg/dL   Near or Above                    Optimal  130-159  mg/dL   Borderline  160-189  mg/dL   High  >190     mg/dL   Very High       Assessment and plan:   Lori Bean was seen today for annual exam, boil on vagina and under rt arm and diabetes check.  Diagnoses and all orders for this visit:  STD exposure Orders: -     Hepatitis C Antibody -     HIV antibody (with reflex) -     RPR -     Begin metroNIDAZOLE (FLAGYL) 500 MG tablet; Take 4 tablets (2,000 mg total) by mouth once. -     Cancel: Cytology - PAP [place] -     Cervicovaginal ancillary only Physical exam is consistent with Trichomonas. Will treat today. Explained no intercourse for 1 week and to use condoms with all sexual encounters. Condoms given today. Patients mate will need treatment once results have posted.   Essential hypertension Orders: -     COMPLETE METABOLIC PANEL WITH GFR -     CBC -     Refill amLODipine (NORVASC) 10 MG tablet; TAKE ONE (1) TABLET BY MOUTH EVERY DAY Dash diet, weight loss advised.  Congestive heart failure, unspecified congestive heart failure chronicity, unspecified congestive heart failure type Orders: -    Refill furosemide (LASIX) 80 MG tablet; TAKE ONE (1) TABLET BY MOUTH EVERY DAY -     Ambulatory referral to Cardiology Patient will need further evaluation due to high dose of lasix with continued fluid retention   Edema See above   Hernia, epigastric Orders: -     Ambulatory referral to General Surgery Mass appears to be a hernia, will refer for surgical removal   Asthma, mild  intermittent, uncomplicated Orders: -   Refill Albuterol Sulfate (PROAIR RESPICLICK) 938 (90 BASE) MCG/ACT AEPB; Inhale 2 puffs into the lungs every 4 (four) hours as needed (Every 4-6 hours as needed for wheezing).  Tobacco abuse disorder Smoking cessation discussed for 3 minutes, patient is not willing to quit at this time. Will continue to assess on each visit. Discussed increased risk for diseases such as cancer, heart disease, and stroke.  I highly advised patient to quit due to risk factors. I have explained that she is at high risk for more blood clots.   Morbid obesity Orders: -     TSH -     Hemoglobin A1C Weight loss discussed at length and its complications to health.  Patient will loss 10 months by next visit in 3 months.  Diet and exercise discussed as well as calorie intake.  Gastroesophageal reflux disease, esophagitis presence not specified Orders: -     omeprazole (PRILOSEC) 20 MG capsule; Take 1 capsule (20 mg total) by mouth daily. Discussed diet and  weight with patient relating to acid reflux.  Went over things that may exacerbate acid reflux such as tomatoes, spicy foods, coffee, carbonated beverages, chocolates, etc.  Advised patient to avoid laying down at least two hours after meals and sleep with HOB elevated.   Bipolar affective disorder, most recent episode unspecified type, remission status unspecified Orders: -     Refill ARIPiprazole (ABILIFY) 20 MG tablet; Take 1 tablet (20 mg total) by mouth at bedtime. Patient referred to [facility]. Explained that all refills will need to come from psychiatry   History of DVT (deep vein thrombosis)    Will discontinue Xarelto has been one year. Highly encouraged smoking cessation  Last blood doppler in 04/03/14 showed no clots  Fall, initial encounter D/c'ed xarelto. Will need to see Ortho if she is continuing to have balance issues and back pain.   Return in about 3 months (around 06/04/2015).      Chari Manning, NP-C [facility] [phone] 03/04/2015, 4:44 PM

## 2015-03-05 ENCOUNTER — Other Ambulatory Visit (HOSPITAL_COMMUNITY)
Admission: RE | Admit: 2015-03-05 | Discharge: 2015-03-05 | Disposition: A | Discharge status: Home / Self Care | Payer: Medicaid Other | Source: Ambulatory Visit | Attending: Internal Medicine | Admitting: Internal Medicine

## 2015-03-05 ENCOUNTER — Encounter: Payer: Self-pay | Admitting: Internal Medicine

## 2015-03-05 DIAGNOSIS — Z113 Encounter for screening for infections with a predominantly sexual mode of transmission: Secondary | ICD-10-CM | POA: Insufficient documentation

## 2015-03-05 DIAGNOSIS — N760 Acute vaginitis: Secondary | ICD-10-CM | POA: Insufficient documentation

## 2015-03-05 LAB — RPR

## 2015-03-05 LAB — COMPLETE METABOLIC PANEL WITH GFR
ALT: 12 U/L (ref 0–35)
AST: 7 U/L (ref 0–37)
Albumin: 3.8 g/dL (ref 3.5–5.2)
Alkaline Phosphatase: 66 U/L (ref 39–117)
BUN: 12 mg/dL (ref 6–23)
CO2: 24 mEq/L (ref 19–32)
Calcium: 9 mg/dL (ref 8.4–10.5)
Chloride: 103 mEq/L (ref 96–112)
Creat: 0.66 mg/dL (ref 0.50–1.10)
GFR, Est African American: >89 mL/min
GFR, Est Non African American: >89 mL/min
Glucose, Bld: 92 mg/dL (ref 70–99)
Potassium: 4.7 mEq/L (ref 3.5–5.3)
Sodium: 143 mEq/L (ref 135–145)
Total Bilirubin: 0.2 mg/dL (ref 0.2–1.2)
Total Protein: 6.7 g/dL (ref 6.0–8.3)

## 2015-03-05 LAB — HEMOGLOBIN A1C
Hgb A1c MFr Bld: 6.1 % — ABNORMAL HIGH (ref <5.7)
Mean Plasma Glucose: 128 mg/dL — ABNORMAL HIGH (ref <117)

## 2015-03-05 LAB — HEPATITIS C ANTIBODY: HCV Ab: NEGATIVE

## 2015-03-05 LAB — HIV ANTIBODY (ROUTINE TESTING W REFLEX): HIV 1&2 Ab, 4th Generation: NONREACTIVE

## 2015-03-05 LAB — TSH: TSH: 0.362 u[IU]/mL (ref 0.350–4.500)

## 2015-03-06 LAB — CERVICOVAGINAL ANCILLARY ONLY
Chlamydia: NEGATIVE
Neisseria Gonorrhea: NEGATIVE
Wet Prep (BD Affirm): POSITIVE — AB

## 2015-03-07 NOTE — Progress Notes (Signed)
Pt notified of lab results

## 2015-04-04 ENCOUNTER — Other Ambulatory Visit: Payer: Self-pay | Admitting: Surgery

## 2015-04-04 DIAGNOSIS — O26899 Other specified pregnancy related conditions, unspecified trimester: Secondary | ICD-10-CM

## 2015-04-04 DIAGNOSIS — R1013 Epigastric pain: Secondary | ICD-10-CM

## 2015-04-04 DIAGNOSIS — R109 Unspecified abdominal pain: Secondary | ICD-10-CM

## 2015-04-08 ENCOUNTER — Other Ambulatory Visit: Payer: Medicaid Other

## 2015-04-08 ENCOUNTER — Ambulatory Visit
Admission: RE | Admit: 2015-04-08 | Discharge: 2015-04-08 | Disposition: A | Discharge status: Home / Self Care | Payer: Medicaid Other | Source: Ambulatory Visit | Attending: Surgery | Admitting: Surgery

## 2015-04-08 MED ORDER — IOPAMIDOL (ISOVUE-300) INJECTION 61%
125.0000 mL | Freq: Once | INTRAVENOUS | Status: AC | PRN
  Administered 2015-04-08: 125 mL via INTRAVENOUS

## 2015-04-15 ENCOUNTER — Telehealth: Payer: Self-pay | Admitting: Internal Medicine

## 2015-04-15 NOTE — Telephone Encounter (Signed)
Patient called to request her CAT Scan results, please f/u with pt.

## 2015-04-21 ENCOUNTER — Emergency Department (HOSPITAL_COMMUNITY): Payer: Medicaid Other

## 2015-04-21 ENCOUNTER — Emergency Department (HOSPITAL_COMMUNITY)
Admission: EM | Admit: 2015-04-21 | Discharge: 2015-04-21 | Disposition: A | Discharge status: Home / Self Care | Payer: Medicaid Other | Attending: Emergency Medicine | Admitting: Emergency Medicine

## 2015-04-21 ENCOUNTER — Encounter (HOSPITAL_COMMUNITY): Payer: Self-pay | Admitting: *Deleted

## 2015-04-21 DIAGNOSIS — Z720 Tobacco use: Secondary | ICD-10-CM | POA: Insufficient documentation

## 2015-04-21 DIAGNOSIS — S4992XA Unspecified injury of left shoulder and upper arm, initial encounter: Secondary | ICD-10-CM | POA: Diagnosis not present

## 2015-04-21 DIAGNOSIS — K219 Gastro-esophageal reflux disease without esophagitis: Secondary | ICD-10-CM | POA: Insufficient documentation

## 2015-04-21 DIAGNOSIS — F319 Bipolar disorder, unspecified: Secondary | ICD-10-CM | POA: Insufficient documentation

## 2015-04-21 DIAGNOSIS — M1990 Unspecified osteoarthritis, unspecified site: Secondary | ICD-10-CM | POA: Diagnosis not present

## 2015-04-21 DIAGNOSIS — I509 Heart failure, unspecified: Secondary | ICD-10-CM | POA: Insufficient documentation

## 2015-04-21 DIAGNOSIS — W19XXXA Unspecified fall, initial encounter: Secondary | ICD-10-CM

## 2015-04-21 DIAGNOSIS — F419 Anxiety disorder, unspecified: Secondary | ICD-10-CM | POA: Diagnosis not present

## 2015-04-21 DIAGNOSIS — M542 Cervicalgia: Secondary | ICD-10-CM

## 2015-04-21 DIAGNOSIS — N3281 Overactive bladder: Secondary | ICD-10-CM | POA: Insufficient documentation

## 2015-04-21 DIAGNOSIS — R011 Cardiac murmur, unspecified: Secondary | ICD-10-CM | POA: Insufficient documentation

## 2015-04-21 DIAGNOSIS — S199XXA Unspecified injury of neck, initial encounter: Secondary | ICD-10-CM | POA: Diagnosis not present

## 2015-04-21 DIAGNOSIS — Z79899 Other long term (current) drug therapy: Secondary | ICD-10-CM | POA: Diagnosis not present

## 2015-04-21 DIAGNOSIS — I10 Essential (primary) hypertension: Secondary | ICD-10-CM | POA: Insufficient documentation

## 2015-04-21 DIAGNOSIS — R200 Anesthesia of skin: Secondary | ICD-10-CM | POA: Diagnosis not present

## 2015-04-21 DIAGNOSIS — Y9389 Activity, other specified: Secondary | ICD-10-CM | POA: Diagnosis not present

## 2015-04-21 DIAGNOSIS — S8992XA Unspecified injury of left lower leg, initial encounter: Secondary | ICD-10-CM | POA: Diagnosis not present

## 2015-04-21 DIAGNOSIS — Z792 Long term (current) use of antibiotics: Secondary | ICD-10-CM | POA: Diagnosis not present

## 2015-04-21 DIAGNOSIS — S299XXA Unspecified injury of thorax, initial encounter: Secondary | ICD-10-CM | POA: Diagnosis not present

## 2015-04-21 DIAGNOSIS — M545 Low back pain, unspecified: Secondary | ICD-10-CM

## 2015-04-21 DIAGNOSIS — S3992XA Unspecified injury of lower back, initial encounter: Secondary | ICD-10-CM | POA: Diagnosis present

## 2015-04-21 DIAGNOSIS — J45909 Unspecified asthma, uncomplicated: Secondary | ICD-10-CM | POA: Diagnosis not present

## 2015-04-21 DIAGNOSIS — F209 Schizophrenia, unspecified: Secondary | ICD-10-CM | POA: Diagnosis not present

## 2015-04-21 DIAGNOSIS — Y999 Unspecified external cause status: Secondary | ICD-10-CM | POA: Insufficient documentation

## 2015-04-21 DIAGNOSIS — R0781 Pleurodynia: Secondary | ICD-10-CM

## 2015-04-21 DIAGNOSIS — I209 Angina pectoris, unspecified: Secondary | ICD-10-CM | POA: Insufficient documentation

## 2015-04-21 DIAGNOSIS — Y929 Unspecified place or not applicable: Secondary | ICD-10-CM | POA: Diagnosis not present

## 2015-04-21 MED ORDER — HYDROCODONE-ACETAMINOPHEN 5-325 MG PO TABS: 1.0000 | ORAL_TABLET | Freq: Four times a day (QID) | ORAL | Status: DC | PRN

## 2015-04-21 MED ORDER — MORPHINE SULFATE 4 MG/ML IJ SOLN
4.0000 mg | Freq: Once | INTRAMUSCULAR | Status: AC
  Administered 2015-04-21: 4 mg via INTRAMUSCULAR
  Filled 2015-04-21: qty 1

## 2015-04-21 MED ORDER — IBUPROFEN 800 MG PO TABS: 800.0000 mg | ORAL_TABLET | Freq: Three times a day (TID) | ORAL | Status: DC

## 2015-04-21 NOTE — ED Provider Notes (Signed)
CSN: [id number]     Arrival date & time 04/21/15  1237 History   First MD Initiated Contact with Patient 04/21/15 1324     Chief Complaint  Patient presents with  . Fall     (Consider location/radiation/quality/duration/timing/severity/associated sxs/prior Treatment) HPI  She is a 44 year old woman here for evaluation of left side pain after a fall. She states she was coming out of an RV on Thursday when the stairs broke and she fell, landing on her left side. She reports a gradually worsening pain in her left neck, left posterior ribs, left lower back, and left leg. She reports pain with flexion and extension of her neck on the left side. The pain will radiate into her shoulder, but does not go down her arm. She denies any numbness or tingling of her arm. She is sore all along the left side of her back. Her pain is worse with deep breaths. She denies feeling short of breath. She also has pain all along the outside of her left leg. She does report some numbness in her left foot. She has a history of several back surgeries. She has tried oxycodone, Tylenol, ibuprofen without much improvement.  No bowel or bladder incontinence.  Past Medical History  Diagnosis Date  . Asthma   . Hypertension   . Anxiety   . Snoring disorder     Pt stated my boyfriend always wakes me up and tells me to breathe  . Heart murmur   . CHF (congestive heart failure)   . Shortness of breath   . Bronchitis     hx of  . Bipolar affective disorder     Schizophrenia  . Depression   . GERD (gastroesophageal reflux disease)   . Arthritis   . Overactive bladder   . Schizophrenia   . Sciatica   . Anginal pain     occ from asthma   Past Surgical History  Procedure Laterality Date  . Rotator cuff repair      Right shoulder  . Tubal ligation    . Gallstones reomved    . Lumbar laminectomy/decompression microdiscectomy  04/01/2012    Procedure: LUMBAR LAMINECTOMY/DECOMPRESSION MICRODISCECTOMY 2 LEVELS;  Surgeon:  Faythe Ghee, MD;  Location: [facility];  Service: Neurosurgery;  Laterality: Left;  Lumbar four-five, lumbar five sacral one microdiscectomy   . Lumbar wound debridement  04/29/2012    Procedure: LUMBAR WOUND DEBRIDEMENT;  Surgeon: Faythe Ghee, MD;  Location: [facility];  Service: Neurosurgery;  Laterality: N/A;  lumbar wound debridement  . Cholecystectomy    . Eye surgery      Metal plate in right eye  . Back surgery      3 back surgeries   Family History  Problem Relation Age of Onset  . Diabetes Mother   . Diabetes Father   . Heart disease Paternal Aunt   . Cancer Paternal Aunt    History  Substance Use Topics  . Smoking status: Current Every Day Smoker -- 0.25 packs/day for 9 years    Types: Cigarettes  . Smokeless tobacco: Never Used  . Alcohol Use: No     Comment: quit Nov. 2014   OB History    No data available     Review of Systems As in history of present illness   Allergies  Aspirin  Home Medications   Prior to Admission medications   Medication Sig Start Date End Date Taking? Authorizing Provider  Albuterol Sulfate (PROAIR RESPICLICK) 702 (90  BASE) MCG/ACT AEPB Inhale 2 puffs into the lungs every 4 (four) hours as needed (Every 4-6 hours as needed for wheezing). 03/04/15   Lance Bosch, NP  amLODipine (NORVASC) 10 MG tablet TAKE ONE (1) TABLET BY MOUTH EVERY DAY 03/04/15   Lance Bosch, NP  ARIPiprazole (ABILIFY) 20 MG tablet Take 1 tablet (20 mg total) by mouth at bedtime. 03/04/15   Lance Bosch, NP  esomeprazole (NEXIUM) 40 MG capsule Take 40 mg by mouth daily at 12 noon.    Historical Provider, MD  furosemide (LASIX) 80 MG tablet TAKE ONE (1) TABLET BY MOUTH EVERY DAY 03/04/15   Lance Bosch, NP  gabapentin (NEURONTIN) 300 MG capsule TAKE ONE (1) CAPSULE BY MOUTH 2 TIMES DAILY 01/22/15   Lance Bosch, NP  HYDROcodone-acetaminophen (NORCO) 5-325 MG per tablet Take 1 tablet by mouth every 6 (six) hours as needed for moderate pain or severe  pain. 04/21/15   Melony Overly, MD  ibuprofen (ADVIL,MOTRIN) 800 MG tablet Take 1 tablet (800 mg total) by mouth 3 (three) times daily. 04/21/15   Melony Overly, MD  metroNIDAZOLE (FLAGYL) 500 MG tablet Take 4 tablets (2,000 mg total) by mouth once. 03/04/15   Lance Bosch, NP  omeprazole (PRILOSEC) 20 MG capsule Take 1 capsule (20 mg total) by mouth daily. 03/04/15   Lance Bosch, NP  oxyCODONE-acetaminophen (PERCOCET) 10-325 MG per tablet Take 1-2 tablets by mouth every 4 (four) hours as needed for pain. 01/02/14   Earnie Larsson, MD  solifenacin (VESICARE) 5 MG tablet Take 1 tablet (5 mg total) by mouth daily. 02/21/14   Lance Bosch, NP  traMADol (ULTRAM) 50 MG tablet Take 1 tablet (50 mg total) by mouth 2 (two) times daily as needed. 02/12/14   Lance Bosch, NP  triamcinolone cream (KENALOG) 0.1 % Apply 1 application topically 2 (two) times daily as needed (eczema). Do not apply to face 02/21/14   Lance Bosch, NP   BP 147/77 mmHg  Pulse 91  Temp(Src) 98.1 F (36.7 C) (Oral)  Resp 18  Ht 5\' 2"  (1.575 m)  Wt 232 lb (105.235 kg)  BMI 42.42 kg/m2  SpO2 96%  LMP 04/21/2015 Physical Exam  Constitutional: She is oriented to person, place, and time. She appears well-developed and well-nourished. No distress.  Cardiovascular: Normal rate, regular rhythm and normal heart sounds.   No murmur heard. Pulmonary/Chest: Effort normal and breath sounds normal. No respiratory distress. She has no wheezes. She has no rales.  Musculoskeletal:  Neck: Full active range of motion. No vertebral tenderness or step-offs. She is tender just to the left of the spinal column as well as the superior trapezius. 2+ radial pulse. 5 out of 5 strength in the hand. Sensation grossly intact. Back: She is diffusely tender to palpation along her left back. No vertebral tenderness or step-offs. 5 out of 5 strength in bilateral lower extremities. Subjective numbness of the left foot.  Neurological: She is alert and oriented to  person, place, and time.    ED Course  Procedures (including critical care time) Labs Review Labs Reviewed - No data to display  Imaging Review Dg Ribs Unilateral W/chest Left  04/21/2015   CLINICAL DATA:  [place] on Thursday stepping out of an RV when the step broke, twisted LEFT ankle and landed on LEFT side, LEFT leg and hip pain, lower back pain, neck pain  EXAM: LEFT RIBS AND CHEST - 3+ VIEW  COMPARISON:  Chest radiographs 11/18/2014  FINDINGS: Upper normal heart size.  Mediastinal contours and pulmonary vascularity normal.  Subsegmental atelectasis at RIGHT base.  Lungs otherwise clear.  No pleural effusion or pneumothorax.  Osseous mineralization grossly normal.  Scattered degenerative disc disease changes with endplate spur formation and thoracic spine.  BB placed at site of symptoms at lower LEFT ribs.  No rib fracture or bone destruction seen.  IMPRESSION: Subsegmental atelectasis RIGHT lung base.  No definite acute rib abnormalities.   Electronically Signed   By: Lavonia Dana M.D.   On: 04/21/2015 14:48   Dg Cervical Spine Complete  04/21/2015   CLINICAL DATA:  44 year old female with a history of fall. Left ankle pain. Cervical neck pain  EXAM: CERVICAL SPINE  4+ VIEWS  COMPARISON:  None.  FINDINGS: Cervical Spine:  Cervical elements from the level of the C1-T1 maintain alignment, without subluxation.  No acute fracture line identified.  Unremarkable appearance of the craniocervical junction.  Vertebral body heights maintained as well as disc space heights.  Anterior osteophyte production of the mid and lower cervical spine.  Oblique images demonstrate no significant foraminal encroachment on the right. On the left there is mild encroachment at C7-T1.  Prevertebral soft tissues within normal limits.  IMPRESSION: No radiographic evidence of acute fracture or malalignment of the cervical spine.  Degenerative changes, predominantly anterior osteophyte production of the mid and lower cervical spine.   Signed,  Dulcy Fanny. Earleen Newport, DO  [facility]  [facility]   Electronically Signed   By: Corrie Mckusick D.O.   On: 04/21/2015 14:47   Dg Lumbar Spine Complete  04/21/2015   CLINICAL DATA:  Per nurse notes: Pt reports falling on Thursday, was stepping out of an RV and step broke. Pt twisted left ankle and landed on left side. Having pain to neck, lower back pain, left leg and hip.  EXAM: LUMBAR SPINE - COMPLETE 4+ VIEW  COMPARISON:  04/17/2015  FINDINGS: No acute fracture. Bilateral pedicle screws and interconnecting rods diffuse L5-S1. There is a radiolucent disc spacer well-centered within the L5-S1 disc space. The orthopedic hardware well-seated with no evidence of loosening  Mild loss disc height from the lower thoracic spine through L3-L4. Moderate loss disc height at L4-L5. Small endplate osteophytes noted throughout the lumbar spine  IMPRESSION: 1. No fracture or acute finding. 2. Stable fusion hardware at L5-S1. 3. Stable disc degenerative changes.   Electronically Signed   By: Lajean Manes M.D.   On: 04/21/2015 14:47     EKG Interpretation None      MDM   Final diagnoses:  Fall, initial encounter  Rib pain on left side  Left-sided low back pain without sciatica  Neck pain on left side    Morphine 4 mg IM given.  Pain is improved after the morphine. X-rays are negative for fracture. Treat with ibuprofen. 15 Vicodin to use as needed for severe pain. Recommended alternating ice and heat. Follow-up with PCP as needed.    Melony Overly, MD 04/21/15 1500

## 2015-04-21 NOTE — Discharge Instructions (Signed)
You have bruising from your fall, but no broken bones. You are going to be sore for the next several weeks. Take ibuprofen 3 times a day for the next week, then as needed. Use the Vicodin every 4-6 hours as needed for severe pain. Alternate ice and heat to the sore areas.

## 2015-04-21 NOTE — ED Notes (Signed)
Pt reports falling on Thursday, was stepping out of an RV and step broke. Pt twisted left ankle and landed on left side. Having pain to neck, lower back pain, left leg and hip.

## 2015-04-23 NOTE — ED Notes (Signed)
Call from pharmacy. States patient had received and filled RX for 100 oxycontin from Dr Sande Rives office 2 day previous. Pharmacy was instructed to destroy Rx for pain written by Dr Bridgett Larsson. Dr Bridgett Larsson advised of events

## 2015-05-03 ENCOUNTER — Ambulatory Visit: Payer: Medicaid Other | Admitting: Cardiology

## 2015-05-14 ENCOUNTER — Ambulatory Visit: Payer: Medicaid Other | Admitting: Internal Medicine

## 2015-05-23 NOTE — Progress Notes (Signed)
Cardiology Office Note   Date:  05/24/2015   ID:  Lori Bean, DOB 1971/01/30, MRN [id number]  PCP:  Lance Bosch, NP  Cardiologist:   Sharol Harness, MD   Chief Complaint  Patient presents with  . Congestive Heart Failure    per patient - she hasn't been seen in awhile  . Chest Pain    worse over the last week-has active "sharp, shooting" pain (8/10) today-has been up since 3a, with radiation to back  . Shortness of Breath    d/t chest pain  . Leg Swelling  . Dizziness      History of Present Illness: Lori Bean is a 44 y.o. female with hypertension, schizophrenia, bipolar disorder, obesity, prior PE and chronic heart failure, type unknown who presents for an evaluation of heart failure.  Lori Bean reports increasing fatigue and shortness of breath over the last month. She states that she was diagnosed with heart failure over a year ago and has been treated by [facility] cardiology, though I do not see any records to this effect.  She carries a diagnosis of congestive heart failure, though there are no echoes in the system. She's been taking Lasix 80 mg twice daily but does not get a good urinary response. She endorses increased lower extremity edema, two-pillow orthopnea, and PND. She also notes intermittent chest pain over the last month that has now been constant since 3 AM. The chest pain is in the substernal and right side of her chest and radiates to her back. It is 8 out of 10 in severity and constant in nature. It is worse with exertion. Her shortness of breath is also worse since the chest pain began. She endorses diaphoresis but denies nausea or vomiting. The pain is sharp in nature. She did move to a new residence this week, but has not been lifting any heavy boxes or items.   Lori Bean reports a 20 pound weight gain in the last one year. She does not get any exercise due to pain in her back and hip. She has been working to improve her diet  recently. She is increasing the number fruits and vegetables that she eats and decreasing the fried foods.  She drinks mostly water throughout the day.  Lori Bean has been evaluated in the emergency department for chest pain in July 2015 and March 2016. During those visits she had negative cardiac enzymes and her pain improved with a GI cocktail. She states that she has been taking her Nexium and this is not helping her current chest pain. She has not ever followed up with cardiology and did not receive a stress test as planned from the emergency department. She states that she did not previously have insurance.  BNP was elevated to 138 03/2014.   Past Medical History  Diagnosis Date  . Asthma   . Hypertension   . Anxiety   . Snoring disorder     Pt stated my boyfriend always wakes me up and tells me to breathe  . Heart murmur   . CHF (congestive heart failure)   . Shortness of breath   . Bronchitis     hx of  . Bipolar affective disorder     Schizophrenia  . Depression   . GERD (gastroesophageal reflux disease)   . Arthritis   . Overactive bladder   . Schizophrenia   . Sciatica   . Anginal pain     occ from asthma  Past Surgical History  Procedure Laterality Date  . Rotator cuff repair      Right shoulder  . Tubal ligation    . Gallstones reomved    . Lumbar laminectomy/decompression microdiscectomy  04/01/2012    Procedure: LUMBAR LAMINECTOMY/DECOMPRESSION MICRODISCECTOMY 2 LEVELS;  Surgeon: Faythe Ghee, MD;  Location: [facility];  Service: Neurosurgery;  Laterality: Left;  Lumbar four-five, lumbar five sacral one microdiscectomy   . Lumbar wound debridement  04/29/2012    Procedure: LUMBAR WOUND DEBRIDEMENT;  Surgeon: Faythe Ghee, MD;  Location: [place] [facility];  Service: Neurosurgery;  Laterality: N/A;  lumbar wound debridement  . Cholecystectomy    . Eye surgery      Metal plate in right eye  . Back surgery      3 back surgeries     Current Outpatient  Prescriptions  Medication Sig Dispense Refill  . Albuterol Sulfate (PROAIR RESPICLICK) 366 (90 BASE) MCG/ACT AEPB Inhale 2 puffs into the lungs every 4 (four) hours as needed (Every 4-6 hours as needed for wheezing). 1 each 3  . amLODipine (NORVASC) 10 MG tablet TAKE ONE (1) TABLET BY MOUTH EVERY DAY 30 tablet 5  . ARIPiprazole (ABILIFY) 20 MG tablet Take 1 tablet (20 mg total) by mouth at bedtime. 30 tablet 0  . esomeprazole (NEXIUM) 40 MG capsule Take 40 mg by mouth daily at 12 noon.    . furosemide (LASIX) 80 MG tablet TAKE ONE (1) TABLET BY MOUTH EVERY DAY 30 tablet 1  . gabapentin (NEURONTIN) 300 MG capsule TAKE ONE (1) CAPSULE BY MOUTH 2 TIMES DAILY 60 capsule 0  . HYDROcodone-acetaminophen (NORCO) 5-325 MG per tablet Take 1 tablet by mouth every 6 (six) hours as needed for moderate pain or severe pain. 15 tablet 0  . ibuprofen (ADVIL,MOTRIN) 800 MG tablet Take 1 tablet (800 mg total) by mouth 3 (three) times daily. 30 tablet 0  . metroNIDAZOLE (FLAGYL) 500 MG tablet Take 4 tablets (2,000 mg total) by mouth once. 4 tablet 0  . omeprazole (PRILOSEC) 20 MG capsule Take 1 capsule (20 mg total) by mouth daily. 30 capsule 6  . oxyCODONE-acetaminophen (PERCOCET) 10-325 MG per tablet Take 1-2 tablets by mouth every 4 (four) hours as needed for pain. 90 tablet 0  . solifenacin (VESICARE) 5 MG tablet Take 1 tablet (5 mg total) by mouth daily. 30 tablet 1  . traMADol (ULTRAM) 50 MG tablet Take 1 tablet (50 mg total) by mouth 2 (two) times daily as needed. 60 tablet 0  . triamcinolone cream (KENALOG) 0.1 % Apply 1 application topically 2 (two) times daily as needed (eczema). Do not apply to face 30 g 2  . [DISCONTINUED] solifenacin (VESICARE) 5 MG tablet Take 5 mg by mouth daily.     No current facility-administered medications for this visit.    Allergies:   Aspirin    Social History:  The patient  reports that she quit smoking about a year ago. Her smoking use included Cigarettes. She has a 2.25  pack-year smoking history. She has never used smokeless tobacco. She reports that she does not drink alcohol or use illicit drugs.   Family History:  The patient's family history includes Cancer in her paternal aunt; Diabetes in her father and mother; Heart disease in her paternal aunt.    ROS:  Please see the history of present illness.   Otherwise, review of systems are positive for none.   All other systems are reviewed and negative.  PHYSICAL EXAM: VS:  BP 112/72 mmHg  Pulse 81  Ht 5\' 2"  (1.575 m)  Wt 103.148 kg (227 lb 6.4 oz)  BMI 41.58 kg/m2 , BMI Body mass index is 41.58 kg/(m^2). GENERAL:  Well appearing HEENT:  Pupils equal round and reactive, fundi not visualized, oral mucosa unremarkable NECK:  No jugular venous distention, waveform within normal limits, carotid upstroke brisk and symmetric, no bruits, no thyromegaly LYMPHATICS:  No cervical adenopathy LUNGS:  Clear to auscultation bilaterally HEART:  RRR.  PMI not displaced or sustained,S1 and S2 within normal limits, no S3, no S4, no clicks, no rubs, no murmurs ABD:  Flat, positive bowel sounds normal in frequency in pitch, no bruits, no rebound, no guarding, no midline pulsatile mass, no hepatomegaly, no splenomegaly EXT:  2 plus pulses throughout, 2+ pitting edema to mid tibia bilaterally, no cyanosis no clubbing SKIN:  No rashes no nodules NEURO:  Cranial nerves II through XII grossly intact, motor grossly intact throughout PSYCH:  Cognitively intact, oriented to person place and time    EKG:  EKG is ordered today. The ekg ordered today demonstrates sinus rhythm.  Non-specific T wave changes.  Prolonged QT.   Recent Labs: 11/18/2014: B Natriuretic Peptide 31.2 03/04/2015: ALT 12; BUN 12; Creat 0.66; Hemoglobin 12.7; Platelets 306; Potassium 4.7; Sodium 143; TSH 0.362    Lipid Panel    Component Value Date/Time   CHOL  07/31/2009 0740    152        ATP III CLASSIFICATION:  <200     mg/dL   Desirable  200-239   mg/dL   Borderline High  >=240    mg/dL   High          TRIG 51 07/31/2009 0740   HDL 62 07/31/2009 0740   CHOLHDL 2.5 07/31/2009 0740   VLDL 10 07/31/2009 0740   LDLCALC  07/31/2009 0740    80        Total Cholesterol/HDL:CHD Risk Coronary Heart Disease Risk Table                     Men   Women  1/2 Average Risk   3.4   3.3  Average Risk       5.0   4.4  2 X Average Risk   9.6   7.1  3 X Average Risk  23.4   11.0        Use the calculated Patient Ratio above and the CHD Risk Table to determine the patient's CHD Risk.        ATP III CLASSIFICATION (LDL):  <100     mg/dL   Optimal  100-129  mg/dL   Near or Above                    Optimal  130-159  mg/dL   Borderline  160-189  mg/dL   High  >190     mg/dL   Very High      Wt Readings from Last 3 Encounters:  05/24/15 103.148 kg (227 lb 6.4 oz)  04/21/15 105.235 kg (232 lb)  03/04/15 101.833 kg (224 lb 8 oz)      Other studies Reviewed: Additional studies/ records that were reviewed today include: . Review of the above records demonstrates:  Please see elsewhere in the note.     ASSESSMENT AND PLAN:  # Chest pain: Symptoms are atypical in that there are ongoing since 3 AM. However they are exertional and  she has a history of hypertension and recently stopped smoking. Her chest pain was not responsive to nitroglycerin in the office. Anterolateral T wave inversions on ECG are new.  We are referring her to the hospital for a cardiac rule out. Assuming this is normal, she will need stress testing. Given her back and leg pain she is likely to need a [employer].   Lori Bean has an aspirin allergy so this was not administered.  # Acute on chronic CHF, type unknown:  Lori Bean has LE edema and heart failure symptoms.  Her next veins do not appear elevated. Given that she has no echo in her system is difficult to determine how to best manage her. I'm concerned the amlodipine may not be the best medication for her  blood pressure. It's unclear to me whether her lower extremity edema is due to amlodipine or Almyra Free due to heart failure, as her central venous pressure does not seem to be very elevated. It's possible that Lasix is not working because she has GI edema. We will switch her Lasix to Bumex 2 mg twice a day. We are referring her to the ED for chest pain. It would be helpful if she could get an echo this admission so that we can better manage her heart failure.  # Hypertension: BP well-controlled.  Continue amlodipine for now, and will change her mediation based on echo results.  # CV Disease prevention: Needs lipid panel checked.  She is going to the ED via EMS, so this can be checked in the hospital or at her next appointment.  # Obesity: Patient was instructed to increase her exercise to 30-40 minutes daily on most days of the week. She will begin this once MI is ruled out and CHF is better managed.  Current medicines are reviewed at length with the patient today.  The patient does not have concerns regarding medicines.  The following changes have been made:  Change furosemide to bumex.  Labs/ tests ordered today include:   No orders of the defined types were placed in this encounter.     Disposition:  To the ED via EMS.  FU with Dr. Lilli Light. [place] in 1 month.    Signed, Sharol Harness, MD  05/24/2015 8:57 AM    [facility]

## 2015-05-24 ENCOUNTER — Emergency Department (HOSPITAL_BASED_OUTPATIENT_CLINIC_OR_DEPARTMENT_OTHER)
Admit: 2015-05-24 | Discharge: 2015-05-24 | Disposition: A | Discharge status: Home / Self Care | Payer: Medicaid Other | Attending: Emergency Medicine | Admitting: Emergency Medicine

## 2015-05-24 ENCOUNTER — Observation Stay (HOSPITAL_COMMUNITY): Payer: Medicaid Other

## 2015-05-24 ENCOUNTER — Encounter: Payer: Self-pay | Admitting: Cardiovascular Disease

## 2015-05-24 ENCOUNTER — Observation Stay (HOSPITAL_COMMUNITY)
Admission: EM | Admit: 2015-05-24 | Discharge: 2015-05-26 | Disposition: A | Discharge status: Home / Self Care | Payer: Medicaid Other | Attending: Cardiovascular Disease | Admitting: Cardiovascular Disease

## 2015-05-24 ENCOUNTER — Ambulatory Visit (INDEPENDENT_AMBULATORY_CARE_PROVIDER_SITE_OTHER): Payer: Medicaid Other | Admitting: Cardiovascular Disease

## 2015-05-24 VITALS — BP 134/79 | HR 82 | Ht 62.0 in | Wt 227.4 lb

## 2015-05-24 DIAGNOSIS — I10 Essential (primary) hypertension: Secondary | ICD-10-CM | POA: Diagnosis not present

## 2015-05-24 DIAGNOSIS — R0789 Other chest pain: Principal | ICD-10-CM | POA: Insufficient documentation

## 2015-05-24 DIAGNOSIS — F319 Bipolar disorder, unspecified: Secondary | ICD-10-CM | POA: Diagnosis present

## 2015-05-24 DIAGNOSIS — F17210 Nicotine dependence, cigarettes, uncomplicated: Secondary | ICD-10-CM | POA: Diagnosis present

## 2015-05-24 DIAGNOSIS — M79609 Pain in unspecified limb: Secondary | ICD-10-CM | POA: Diagnosis not present

## 2015-05-24 DIAGNOSIS — E669 Obesity, unspecified: Secondary | ICD-10-CM | POA: Insufficient documentation

## 2015-05-24 DIAGNOSIS — I5033 Acute on chronic diastolic (congestive) heart failure: Secondary | ICD-10-CM | POA: Insufficient documentation

## 2015-05-24 DIAGNOSIS — Z87891 Personal history of nicotine dependence: Secondary | ICD-10-CM | POA: Insufficient documentation

## 2015-05-24 DIAGNOSIS — R079 Chest pain, unspecified: Secondary | ICD-10-CM | POA: Diagnosis present

## 2015-05-24 DIAGNOSIS — Z6841 Body Mass Index (BMI) 40.0 and over, adult: Secondary | ICD-10-CM | POA: Insufficient documentation

## 2015-05-24 DIAGNOSIS — E876 Hypokalemia: Secondary | ICD-10-CM | POA: Insufficient documentation

## 2015-05-24 DIAGNOSIS — Z86711 Personal history of pulmonary embolism: Secondary | ICD-10-CM | POA: Diagnosis not present

## 2015-05-24 DIAGNOSIS — F209 Schizophrenia, unspecified: Secondary | ICD-10-CM | POA: Diagnosis not present

## 2015-05-24 DIAGNOSIS — R072 Precordial pain: Secondary | ICD-10-CM

## 2015-05-24 DIAGNOSIS — E785 Hyperlipidemia, unspecified: Secondary | ICD-10-CM

## 2015-05-24 LAB — COMPREHENSIVE METABOLIC PANEL
ALT: 34 U/L (ref 14–54)
AST: 26 U/L (ref 15–41)
Albumin: 3.2 g/dL — ABNORMAL LOW (ref 3.5–5.0)
Alkaline Phosphatase: 63 U/L (ref 38–126)
Anion gap: 4 — ABNORMAL LOW (ref 5–15)
BUN: 11 mg/dL (ref 6–20)
CO2: 29 mmol/L (ref 22–32)
Calcium: 9 mg/dL (ref 8.9–10.3)
Chloride: 104 mmol/L (ref 101–111)
Creatinine, Ser: 0.78 mg/dL (ref 0.44–1.00)
GFR calc Af Amer: >60 mL/min (ref >60)
GFR calc non Af Amer: >60 mL/min (ref >60)
Glucose, Bld: 102 mg/dL — ABNORMAL HIGH (ref 65–99)
Potassium: 3.3 mmol/L — ABNORMAL LOW (ref 3.5–5.1)
Sodium: 137 mmol/L (ref 135–145)
Total Bilirubin: 0.5 mg/dL (ref 0.3–1.2)
Total Protein: 6.7 g/dL (ref 6.5–8.1)

## 2015-05-24 LAB — CBC WITH DIFFERENTIAL/PLATELET
Basophils Absolute: 0 10*3/uL (ref 0.0–0.1)
Basophils Relative: 0 % (ref 0–1)
Eosinophils Absolute: 0.4 10*3/uL (ref 0.0–0.7)
Eosinophils Relative: 5 % (ref 0–5)
HCT: 35.1 % — ABNORMAL LOW (ref 36.0–46.0)
Hemoglobin: 11.7 g/dL — ABNORMAL LOW (ref 12.0–15.0)
Lymphocytes Relative: 26 % (ref 12–46)
Lymphs Abs: 2.1 10*3/uL (ref 0.7–4.0)
MCH: 30.2 pg (ref 26.0–34.0)
MCHC: 33.3 g/dL (ref 30.0–36.0)
MCV: 90.5 fL (ref 78.0–100.0)
Monocytes Absolute: 0.8 10*3/uL (ref 0.1–1.0)
Monocytes Relative: 9 % (ref 3–12)
Neutro Abs: 5.1 10*3/uL (ref 1.7–7.7)
Neutrophils Relative %: 61 % (ref 43–77)
Platelets: 252 10*3/uL (ref 150–400)
RBC: 3.88 MIL/uL (ref 3.87–5.11)
RDW: 14.1 % (ref 11.5–15.5)
WBC: 8.4 10*3/uL (ref 4.0–10.5)

## 2015-05-24 LAB — I-STAT TROPONIN, ED: Troponin i, poc: 0.01 ng/mL (ref 0.00–0.08)

## 2015-05-24 LAB — MRSA PCR SCREENING: MRSA by PCR: NEGATIVE

## 2015-05-24 MED ORDER — SODIUM CHLORIDE 0.9 % IV SOLN: 250.0000 mL | INTRAVENOUS | Status: DC | PRN

## 2015-05-24 MED ORDER — MORPHINE SULFATE (PF) 2 MG/ML IV SOLN
2.0000 mg | INTRAVENOUS | Status: DC | PRN
Start: 2015-05-24 — End: 2015-05-26
  Administered 2015-05-24: 2 mg via INTRAVENOUS
  Administered 2015-05-24: 4 mg via INTRAVENOUS
  Filled 2015-05-24: qty 1
  Filled 2015-05-24: qty 2

## 2015-05-24 MED ORDER — OXYCODONE-ACETAMINOPHEN 10-325 MG PO TABS: 1.0000 | ORAL_TABLET | ORAL | Status: DC | PRN

## 2015-05-24 MED ORDER — ALBUTEROL SULFATE (2.5 MG/3ML) 0.083% IN NEBU: 2.5000 mg | INHALATION_SOLUTION | RESPIRATORY_TRACT | Status: DC | PRN

## 2015-05-24 MED ORDER — OXYCODONE-ACETAMINOPHEN 5-325 MG PO TABS
1.0000 | ORAL_TABLET | ORAL | Status: DC | PRN
  Administered 2015-05-25 – 2015-05-26 (×3): 1 via ORAL
  Filled 2015-05-24 (×3): qty 1

## 2015-05-24 MED ORDER — DARIFENACIN HYDROBROMIDE ER 7.5 MG PO TB24: 7.5000 mg | ORAL_TABLET | Freq: Every day | ORAL | Status: DC

## 2015-05-24 MED ORDER — NITROGLYCERIN 0.4 MG SL SUBL: 0.4000 mg | SUBLINGUAL_TABLET | SUBLINGUAL | Status: DC | PRN

## 2015-05-24 MED ORDER — ONDANSETRON HCL 4 MG/2ML IJ SOLN: 4.0000 mg | Freq: Four times a day (QID) | INTRAMUSCULAR | Status: DC | PRN

## 2015-05-24 MED ORDER — ARIPIPRAZOLE 10 MG PO TABS
20.0000 mg | ORAL_TABLET | Freq: Every day | ORAL | Status: DC
  Administered 2015-05-24 – 2015-05-25 (×2): 20 mg via ORAL
  Filled 2015-05-24 (×4): qty 2

## 2015-05-24 MED ORDER — ENOXAPARIN SODIUM 40 MG/0.4ML ~~LOC~~ SOLN
40.0000 mg | SUBCUTANEOUS | Status: DC
  Administered 2015-05-24 – 2015-05-25 (×2): 40 mg via SUBCUTANEOUS
  Filled 2015-05-24 (×2): qty 0.4

## 2015-05-24 MED ORDER — DARIFENACIN HYDROBROMIDE ER 7.5 MG PO TB24
7.5000 mg | ORAL_TABLET | ORAL | Status: AC
  Administered 2015-05-24: 7.5 mg via ORAL
  Filled 2015-05-24: qty 1

## 2015-05-24 MED ORDER — AMLODIPINE BESYLATE 10 MG PO TABS
10.0000 mg | ORAL_TABLET | Freq: Every day | ORAL | Status: DC
  Administered 2015-05-24 – 2015-05-26 (×3): 10 mg via ORAL
  Filled 2015-05-24 (×2): qty 1
  Filled 2015-05-24: qty 2

## 2015-05-24 MED ORDER — NITROGLYCERIN 0.4 MG SL SUBL
0.4000 mg | SUBLINGUAL_TABLET | SUBLINGUAL | Status: DC | PRN
  Administered 2015-05-24: 0.4 mg via SUBLINGUAL

## 2015-05-24 MED ORDER — ALPRAZOLAM 0.25 MG PO TABS
0.2500 mg | ORAL_TABLET | Freq: Two times a day (BID) | ORAL | Status: DC | PRN
  Administered 2015-05-26: 0.25 mg via ORAL
  Filled 2015-05-24: qty 1

## 2015-05-24 MED ORDER — BUMETANIDE 2 MG PO TABS: 2.0000 mg | ORAL_TABLET | Freq: Two times a day (BID) | ORAL | Status: DC

## 2015-05-24 MED ORDER — DARIFENACIN HYDROBROMIDE ER 7.5 MG PO TB24
7.5000 mg | ORAL_TABLET | Freq: Every day | ORAL | Status: DC
  Administered 2015-05-25 – 2015-05-26 (×2): 7.5 mg via ORAL
  Filled 2015-05-24 (×2): qty 1

## 2015-05-24 MED ORDER — BUMETANIDE 2 MG PO TABS
2.0000 mg | ORAL_TABLET | ORAL | Status: AC
  Administered 2015-05-24: 2 mg via ORAL
  Filled 2015-05-24: qty 1

## 2015-05-24 MED ORDER — BUMETANIDE 2 MG PO TABS
2.0000 mg | ORAL_TABLET | Freq: Two times a day (BID) | ORAL | Status: DC
  Administered 2015-05-25 – 2015-05-26 (×3): 2 mg via ORAL
  Filled 2015-05-24 (×5): qty 1

## 2015-05-24 MED ORDER — ACETAMINOPHEN 325 MG PO TABS
650.0000 mg | ORAL_TABLET | ORAL | Status: DC | PRN
  Administered 2015-05-25: 650 mg via ORAL
  Filled 2015-05-24: qty 2

## 2015-05-24 MED ORDER — BUMETANIDE 2 MG PO TABS: 2.0000 mg | ORAL_TABLET | Freq: Every day | ORAL | Status: DC

## 2015-05-24 MED ORDER — OXYCODONE HCL 5 MG PO TABS
5.0000 mg | ORAL_TABLET | ORAL | Status: DC | PRN
Start: 2015-05-24 — End: 2015-05-26

## 2015-05-24 MED ORDER — ZOLPIDEM TARTRATE 5 MG PO TABS
5.0000 mg | ORAL_TABLET | Freq: Every evening | ORAL | Status: DC | PRN
  Administered 2015-05-24: 5 mg via ORAL
  Filled 2015-05-24: qty 1

## 2015-05-24 MED ORDER — TRIAMCINOLONE ACETONIDE 0.1 % EX CREA
1.0000 "application " | TOPICAL_CREAM | Freq: Two times a day (BID) | CUTANEOUS | Status: DC | PRN
  Filled 2015-05-24: qty 15

## 2015-05-24 MED ORDER — PANTOPRAZOLE SODIUM 40 MG PO TBEC
40.0000 mg | DELAYED_RELEASE_TABLET | Freq: Every day | ORAL | Status: DC
  Administered 2015-05-24 – 2015-05-26 (×3): 40 mg via ORAL
  Filled 2015-05-24 (×3): qty 1

## 2015-05-24 MED ORDER — SODIUM CHLORIDE 0.9 % IJ SOLN
3.0000 mL | Freq: Two times a day (BID) | INTRAMUSCULAR | Status: DC
  Administered 2015-05-24 – 2015-05-25 (×3): 3 mL via INTRAVENOUS

## 2015-05-24 MED ORDER — NITROGLYCERIN 2 % TD OINT
0.5000 [in_us] | TOPICAL_OINTMENT | Freq: Four times a day (QID) | TRANSDERMAL | Status: DC
  Administered 2015-05-24 – 2015-05-26 (×8): 0.5 [in_us] via TOPICAL
  Filled 2015-05-24: qty 30

## 2015-05-24 MED ORDER — SODIUM CHLORIDE 0.9 % IJ SOLN: 3.0000 mL | INTRAMUSCULAR | Status: DC | PRN

## 2015-05-24 NOTE — ED Provider Notes (Signed)
CSN: [id number]     Arrival date & time 05/24/15  1037 History   First MD Initiated Contact with Patient 05/24/15 1050     Chief Complaint  Patient presents with  . Chest Pain   HPI  [name] is a 44 year old female with PMHx of HTN, CHF and GERD presenting from [facility] with atypical chest pain. Pt reports onset of chest pain at 3 AM this morning. Pain has been constant and is exacerbated by exertion and deep inspiration. Pain is sharp, mid-sternal and radiates to her back. Associated symptoms are diaphoresis and shortness of breath. Pt was given SL nitro at cardiologist office without relief. Pt was evaluated by Dr. Oval Linsey this morning who transferred her here for admission for cardiac rule out. Pt is also complaining of left calf pain that began approximately 1 day ago. Pt reports chronic leg swelling 2/2 CHF but she has noticed more swelling in the left leg today. Pt has history of DVT in left leg in May 2015 and was put on xarelto. Pt discontinued xarelto in June 2016. Denies syncope, headache, dizziness, neck pain, arm pain, abdominal pain, nausea or vomiting.   Past Medical History  Diagnosis Date  . Asthma   . Hypertension   . Anxiety   . Snoring disorder     Pt stated my boyfriend always wakes me up and tells me to breathe  . Heart murmur   . CHF (congestive heart failure)   . Shortness of breath   . Bronchitis     hx of  . Bipolar affective disorder     Schizophrenia  . Depression   . GERD (gastroesophageal reflux disease)   . Arthritis   . Overactive bladder   . Schizophrenia   . Sciatica   . Anginal pain     occ from asthma   Past Surgical History  Procedure Laterality Date  . Rotator cuff repair      Right shoulder  . Tubal ligation    . Gallstones reomved    . Lumbar laminectomy/decompression microdiscectomy  04/01/2012    Procedure: LUMBAR LAMINECTOMY/DECOMPRESSION MICRODISCECTOMY 2 LEVELS;  Surgeon: Faythe Ghee, MD;  Location: [facility];  Service:  Neurosurgery;  Laterality: Left;  Lumbar four-five, lumbar five sacral one microdiscectomy   . Lumbar wound debridement  04/29/2012    Procedure: LUMBAR WOUND DEBRIDEMENT;  Surgeon: Faythe Ghee, MD;  Location: [place] [facility];  Service: Neurosurgery;  Laterality: N/A;  lumbar wound debridement  . Cholecystectomy    . Eye surgery      Metal plate in right eye  . Back surgery      3 back surgeries   Family History  Problem Relation Age of Onset  . Diabetes Mother   . Diabetes Father   . Heart disease Paternal Aunt   . Cancer Paternal Aunt    Social History  Substance Use Topics  . Smoking status: Former Smoker -- 0.25 packs/day for 9 years    Types: Cigarettes    Quit date: 05/23/2014  . Smokeless tobacco: Never Used  . Alcohol Use: No     Comment: quit Nov. 2014   OB History    No data available     Review of Systems  Constitutional: Positive for diaphoresis. Negative for fever and chills.  Respiratory: Positive for shortness of breath.   Cardiovascular: Positive for chest pain and leg swelling.  Gastrointestinal: Negative for nausea, vomiting and abdominal pain.  Musculoskeletal: Positive for back pain.  Negative for myalgias and arthralgias.  Neurological: Negative for dizziness, syncope, light-headedness and headaches.      Allergies  Aspirin  Home Medications   Prior to Admission medications   Medication Sig Start Date End Date Taking? Authorizing Provider  Albuterol Sulfate (PROAIR RESPICLICK) 034 (90 BASE) MCG/ACT AEPB Inhale 2 puffs into the lungs every 4 (four) hours as needed (Every 4-6 hours as needed for wheezing). 03/04/15  Yes Lance Bosch, NP  amLODipine (NORVASC) 10 MG tablet TAKE ONE (1) TABLET BY MOUTH EVERY DAY 03/04/15  Yes Lance Bosch, NP  ARIPiprazole (ABILIFY) 20 MG tablet Take 1 tablet (20 mg total) by mouth at bedtime. 03/04/15  Yes Lance Bosch, NP  gabapentin (NEURONTIN) 300 MG capsule TAKE ONE (1) CAPSULE BY MOUTH 2 TIMES DAILY 01/22/15   Yes Lance Bosch, NP  ibuprofen (ADVIL,MOTRIN) 800 MG tablet Take 1 tablet (800 mg total) by mouth 3 (three) times daily. 04/21/15  Yes Melony Overly, MD  omeprazole (PRILOSEC) 20 MG capsule Take 1 capsule (20 mg total) by mouth daily. 03/04/15  Yes Lance Bosch, NP  oxyCODONE-acetaminophen (PERCOCET) 10-325 MG per tablet Take 1-2 tablets by mouth every 4 (four) hours as needed for pain. Patient taking differently: Take 1 tablet by mouth every 4 (four) hours as needed for pain.  01/02/14  Yes Earnie Larsson, MD  solifenacin (VESICARE) 5 MG tablet Take 1 tablet (5 mg total) by mouth daily. 02/21/14  Yes Lance Bosch, NP  triamcinolone cream (KENALOG) 0.1 % Apply 1 application topically 2 (two) times daily as needed (eczema). Do not apply to face 02/21/14  Yes Lance Bosch, NP  bumetanide (BUMEX) 2 MG tablet Take 1 tablet (2 mg total) by mouth daily. 05/24/15   [name], MD   BP 142/86 mmHg  Pulse 83  Temp(Src) 98.3 F (36.8 C) (Oral)  Resp 28  SpO2 99% Physical Exam  Constitutional: She is oriented to person, place, and time. She appears well-developed and well-nourished.  HENT:  Head: Normocephalic and atraumatic.  Eyes: Conjunctivae and EOM are normal. Pupils are equal, round, and reactive to light.  Neck: Normal range of motion.  Cardiovascular: Normal rate, regular rhythm and normal heart sounds.   Left calf TTP. No palpable cords. No erythema over calf. Pitting edema over BLE to mid-tibia  Pulmonary/Chest: Effort normal and breath sounds normal. No respiratory distress. She has no wheezes. She has no rales. She exhibits no tenderness.  Abdominal: Soft. There is no tenderness.  Musculoskeletal: Normal range of motion.  Neurological: She is alert and oriented to person, place, and time.  5/5 motor strength of extremities. Sensation to light touch intact throughout.   Skin: Skin is warm and dry.  Psychiatric: She has a normal mood and affect. Her behavior is normal.  Nursing note  and vitals reviewed.   ED Course  Procedures (including critical care time) Labs Review Labs Reviewed  CBC WITH DIFFERENTIAL/PLATELET - Abnormal; Notable for the following:    Hemoglobin 11.7 (*)    HCT 35.1 (*)    All other components within normal limits  COMPREHENSIVE METABOLIC PANEL - Abnormal; Notable for the following:    Potassium 3.3 (*)    Glucose, Bld 102 (*)    Albumin 3.2 (*)    Anion gap 4 (*)    All other components within normal limits  I-STAT TROPOININ, ED    Imaging Review No results found. I have personally reviewed and evaluated these images and  lab results as part of my medical decision-making.   EKG Interpretation None        MDM   Final diagnoses:  Chest pain, unspecified chest pain type   [facility]. Chest pain started early this morning and is constant, sharp and worsens with exertion and deep inspiration. Associated with diaphoresis and SOB. Pt also complaining of left calf pain. Pt has history of DVT in May 2015 post back surgery. Pt was on xarelto and this was discontinued in June 2016. VSS. Pt nontoxic appearing. Lungs CTAB. Heart RRR. Tenderness to left calf with mild increase in swelling. Pt has baseline bilateral leg swelling 2/2 CHF. CBC, CMP unremarkable. Troponin 0.01. Venous duplex negative for DVT. Consulted to cardiology who will take the admission.     Josephina Gip, PA-C 05/24/15 [place], MD 05/29/15 [phone]

## 2015-05-24 NOTE — ED Notes (Signed)
Patient undressed, in gown, on monitor, continuous pulse oximetry and blood pressure cuff 

## 2015-05-24 NOTE — Progress Notes (Signed)
*  PRELIMINARY RESULTS* Echocardiogram 2D Echocardiogram has been performed.  Leavy Cella 05/24/2015, 4:54 PM

## 2015-05-24 NOTE — ED Notes (Signed)
C/O chest pain that began last night about 0300. States that it woke her out of her sleep.  Denies Nausea and vomiting.  No ASA due to ASA allergy.  NGT SL given without improvement.

## 2015-05-24 NOTE — Patient Instructions (Addendum)
Dr Oval Linsey has recommended making the following medication changes: STOP Furosemide START Bumex 2mg  - take 1 tablet by mouth TWICE daily. A new prescription has been sent to your pharmacy electronically.   Dr Oval Linsey recommends that you schedule a follow-up appointment in 1 month.

## 2015-05-24 NOTE — Progress Notes (Signed)
*  PRELIMINARY RESULTS* Vascular Ultrasound Left lower extremity venous duplex has been completed.  Preliminary findings: negative for DVT.  Landry Mellow, RDMS, RVT  05/24/2015, 12:30 PM

## 2015-05-24 NOTE — ED Provider Notes (Signed)
The patient is a 44 year old female, she has a potential history of CHF however she has significant peripheral edema, she has been having some other discomfort and shortness of breath as well. She was sent from the cardiology offices because of increasing symptoms. She complains of left leg pain in the calf and has a history of a DVT. According to the notes from the cardiology offices she will need to be admitted for stress test, echocardiogram. On exam she does have some tenderness in the left calf, she has clear heart and lung sounds, she is in no distress and speaking in full sentences. EKG is abnormal, will discuss with cardiology when labs have returned.  ED ECG REPORT I personally interpreted this EKG  Date: 05/24/2015  Rate: 73 Rhythm: normal sinus rhythm QRS Axis: normal Intervals: normal ST/T Wave abnormalities: nonspecific T wave changes Conduction Disutrbances:none Narrative Interpretation:  Old EKG Reviewed: none available  Medical screening examination/treatment/procedure(s) were conducted as a shared visit with non-physician practitioner(s) and myself.  I personally evaluated the patient during the encounter.  Clinical Impression:   Final diagnoses:  Chest pain, unspecified chest pain type     Meds given in ED:  Medications  bumetanide (BUMEX) tablet 2 mg (2 mg Oral Given 05/24/15 1555)  darifenacin (ENABLEX) 24 hr tablet 7.5 mg (7.5 mg Oral Given 05/24/15 1555)  technetium sestamibi generic (CARDIOLITE) injection 11 milli Curie (11 milli Curies Intravenous Contrast Given 05/26/15 0840)  regadenoson (LEXISCAN) injection SOLN 0.4 mg (0.4 mg Intravenous Given 05/26/15 1027)  technetium sestamibi generic (CARDIOLITE) injection 30 milli Curie (30 milli Curies Intravenous Contrast Given 05/26/15 1211)  potassium chloride SA (K-DUR,KLOR-CON) CR tablet 40 mEq (40 mEq Oral Given 05/26/15 1612)    Discharge Medication List as of 05/26/2015  4:17 PM    START taking these  medications   Details  potassium chloride 20 MEQ TBCR Take 20 mEq by mouth 2 (two) times daily., Starting 05/26/2015, Until Discontinued, Normal          Noemi Chapel, MD 05/29/15 [phone]

## 2015-05-24 NOTE — ED Notes (Signed)
Patient states that she was seeing her heart doctor this AM who is Dr Oval Linsey. States that the MD was concerned about the chest pain that she was having and sent her to the ED for evaluation.  Patient states that she is having upper mid sternal chest pain that radiates to her lower back. C/O sweating and shortness of breath.  States that the pain worsens when she breathes in.

## 2015-05-24 NOTE — H&P (Signed)
Expand All Collapse All      Cardiology Office Note   Date: 05/24/2015   ID: Lori Bean, DOB 06-20-1971, MRN [id number]  PCP: Lance Bosch, NP Cardiologist: Sharol Harness, MD   Chief Complaint  Patient presents with  . Congestive Heart Failure    per patient - she hasn't been seen in awhile  . Chest Pain    worse over the last week-has active "sharp, shooting" pain (8/10) today-has been up since 3a, with radiation to back  . Shortness of Breath    d/t chest pain  . Leg Swelling  . Dizziness     History of Present Illness: Lori Bean is a 44 y.o. female with hypertension, schizophrenia, bipolar disorder, obesity, prior PE and chronic heart failure, type unknown who presents for an evaluation of heart failure. Lori Bean reports increasing fatigue and shortness of breath over the last month. She states that she was diagnosed with heart failure over a year ago and has been treated by [facility] cardiology, though I do not see any records to this effect. She carries a diagnosis of congestive heart failure, though there are no echoes in the system. She's been taking Lasix 80 mg twice daily but does not get a good urinary response. She endorses increased lower extremity edema, two-pillow orthopnea, and PND. She also notes intermittent chest pain over the last month that has now been constant since 3 AM. The chest pain is in the substernal and right side of her chest and radiates to her back. It is 8 out of 10 in severity and constant in nature. It is worse with exertion. Her shortness of breath is also worse since the chest pain began. She endorses diaphoresis but denies nausea or vomiting. The pain is sharp in nature. She did move to a new residence this week, but has not been lifting any heavy boxes or items.   Lori Bean reports a 20 pound weight gain in the last one year. She does not get any exercise due to pain in her  back and hip. She has been working to improve her diet recently. She is increasing the number fruits and vegetables that she eats and decreasing the fried foods. She drinks mostly water throughout the day.  Lori Bean has been evaluated in the emergency department for chest pain in July 2015 and March 2016. During those visits she had negative cardiac enzymes and her pain improved with a GI cocktail. She states that she has been taking her Nexium and this is not helping her current chest pain. She has not ever followed up with cardiology and did not receive a stress test as planned from the emergency department. She states that she did not previously have insurance. BNP was elevated to 138 03/2014.   Past Medical History  Diagnosis Date  . Asthma   . Hypertension   . Anxiety   . Snoring disorder     Pt stated my boyfriend always wakes me up and tells me to breathe  . Heart murmur   . CHF (congestive heart failure)   . Shortness of breath   . Bronchitis     hx of  . Bipolar affective disorder     Schizophrenia  . Depression   . GERD (gastroesophageal reflux disease)   . Arthritis   . Overactive bladder   . Schizophrenia   . Sciatica   . Anginal pain     occ from asthma    Past Surgical  History  Procedure Laterality Date  . Rotator cuff repair      Right shoulder  . Tubal ligation    . Gallstones reomved    . Lumbar laminectomy/decompression microdiscectomy  04/01/2012    Procedure: LUMBAR LAMINECTOMY/DECOMPRESSION MICRODISCECTOMY 2 LEVELS; Surgeon: Faythe Ghee, MD; Location: [facility]; Service: Neurosurgery; Laterality: Left; Lumbar four-five, lumbar five sacral one microdiscectomy   . Lumbar wound debridement  04/29/2012    Procedure: LUMBAR WOUND DEBRIDEMENT; Surgeon: Faythe Ghee, MD; Location: [facility] [facility]; Service: Neurosurgery; Laterality: N/A; lumbar  wound debridement  . Cholecystectomy    . Eye surgery      Metal plate in right eye  . Back surgery      3 back surgeries     Current Outpatient Prescriptions  Medication Sig Dispense Refill  . Albuterol Sulfate (PROAIR RESPICLICK) 151 (90 BASE) MCG/ACT AEPB Inhale 2 puffs into the lungs every 4 (four) hours as needed (Every 4-6 hours as needed for wheezing). 1 each 3  . amLODipine (NORVASC) 10 MG tablet TAKE ONE (1) TABLET BY MOUTH EVERY DAY 30 tablet 5  . ARIPiprazole (ABILIFY) 20 MG tablet Take 1 tablet (20 mg total) by mouth at bedtime. 30 tablet 0  . esomeprazole (NEXIUM) 40 MG capsule Take 40 mg by mouth daily at 12 noon.    . furosemide (LASIX) 80 MG tablet TAKE ONE (1) TABLET BY MOUTH EVERY DAY 30 tablet 1  . gabapentin (NEURONTIN) 300 MG capsule TAKE ONE (1) CAPSULE BY MOUTH 2 TIMES DAILY 60 capsule 0  . HYDROcodone-acetaminophen (NORCO) 5-325 MG per tablet Take 1 tablet by mouth every 6 (six) hours as needed for moderate pain or severe pain. 15 tablet 0  . ibuprofen (ADVIL,MOTRIN) 800 MG tablet Take 1 tablet (800 mg total) by mouth 3 (three) times daily. 30 tablet 0  . metroNIDAZOLE (FLAGYL) 500 MG tablet Take 4 tablets (2,000 mg total) by mouth once. 4 tablet 0  . omeprazole (PRILOSEC) 20 MG capsule Take 1 capsule (20 mg total) by mouth daily. 30 capsule 6  . oxyCODONE-acetaminophen (PERCOCET) 10-325 MG per tablet Take 1-2 tablets by mouth every 4 (four) hours as needed for pain. 90 tablet 0  . solifenacin (VESICARE) 5 MG tablet Take 1 tablet (5 mg total) by mouth daily. 30 tablet 1  . traMADol (ULTRAM) 50 MG tablet Take 1 tablet (50 mg total) by mouth 2 (two) times daily as needed. 60 tablet 0  . triamcinolone cream (KENALOG) 0.1 % Apply 1 application topically 2 (two) times daily as needed (eczema). Do not apply to face 30 g 2  . [DISCONTINUED] solifenacin (VESICARE) 5 MG tablet  Take 5 mg by mouth daily.     No current facility-administered medications for this visit.    Allergies: Aspirin    Social History: The patient  reports that she quit smoking about a year ago. Her smoking use included Cigarettes. She has a 2.25 pack-year smoking history. She has never used smokeless tobacco. She reports that she does not drink alcohol or use illicit drugs.   Family History: The patient's family history includes Cancer in her paternal aunt; Diabetes in her father and mother; Heart disease in her paternal aunt.    ROS: Please see the history of present illness. Otherwise, review of systems are positive for none. All other systems are reviewed and negative.    PHYSICAL EXAM: VS: BP 112/72 mmHg  Pulse 81  Ht 5\' 2"  (1.575 m)  Wt 103.148 kg (227  lb 6.4 oz)  BMI 41.58 kg/m2 , BMI Body mass index is 41.58 kg/(m^2). GENERAL: Well appearing HEENT: Pupils equal round and reactive, fundi not visualized, oral mucosa unremarkable NECK: No jugular venous distention, waveform within normal limits, carotid upstroke brisk and symmetric, no bruits, no thyromegaly LYMPHATICS: No cervical adenopathy LUNGS: Clear to auscultation bilaterally HEART: RRR. PMI not displaced or sustained,S1 and S2 within normal limits, no S3, no S4, no clicks, no rubs, no murmurs ABD: Flat, positive bowel sounds normal in frequency in pitch, no bruits, no rebound, no guarding, no midline pulsatile mass, no hepatomegaly, no splenomegaly EXT: 2 plus pulses throughout, 2+ pitting edema to mid tibia bilaterally, no cyanosis no clubbing SKIN: No rashes no nodules NEURO: Cranial nerves II through XII grossly intact, motor grossly intact throughout PSYCH: Cognitively intact, oriented to person place and time    EKG: EKG is ordered today. The ekg ordered today demonstrates sinus rhythm. Non-specific T wave changes. Prolonged QT.   Recent Labs: 11/18/2014: B Natriuretic Peptide  31.2 03/04/2015: ALT 12; BUN 12; Creat 0.66; Hemoglobin 12.7; Platelets 306; Potassium 4.7; Sodium 143; TSH 0.362    Lipid Panel  Labs (Brief)       Component Value Date/Time   CHOL  07/31/2009 0740    152  ATP III CLASSIFICATION: <200 mg/dL Desirable 200-239 mg/dL Borderline High >=240 mg/dL High     TRIG 51 07/31/2009 0740   HDL 62 07/31/2009 0740   CHOLHDL 2.5 07/31/2009 0740   VLDL 10 07/31/2009 0740   LDLCALC  07/31/2009 0740    80  Total Cholesterol/HDL:CHD Risk Coronary Heart Disease Risk Table  Men Women 1/2 Average Risk 3.4 3.3 Average Risk 5.0 4.4 2 X Average Risk 9.6 7.1 3 X Average Risk 23.4 11.0   Use the calculated Patient Ratio above and the CHD Risk Table to determine the patient's CHD Risk.   ATP III CLASSIFICATION (LDL): <100 mg/dL Optimal 100-129 mg/dL Near or Above  Optimal 130-159 mg/dL Borderline 160-189 mg/dL High >190 mg/dL Very High       Wt Readings from Last 3 Encounters:  05/24/15 103.148 kg (227 lb 6.4 oz)  04/21/15 105.235 kg (232 lb)  03/04/15 101.833 kg (224 lb 8 oz)      Other studies Reviewed: Additional studies/ records that were reviewed today include: . Review of the above records demonstrates: Please see elsewhere in the note.    ASSESSMENT AND PLAN:  # Chest pain: Symptoms are atypical in that there are ongoing since 3 AM. However they are exertional and she has a history of hypertension and recently stopped smoking. Her chest pain was not responsive to nitroglycerin in the office. Anterolateral T wave inversions on ECG are new. We are referring her to the hospital for a cardiac rule out. Assuming this is normal, she will need stress testing. Given her back and leg pain she is likely to need a [employer]. Lori Bean has an  aspirin allergy so this was not administered.  # Acute on chronic CHF, type unknown: Lori Bean has LE edema and heart failure symptoms. Her next veins do not appear elevated. Given that she has no echo in her system is difficult to determine how to best manage her. I'm concerned the amlodipine may not be the best medication for her blood pressure. It's unclear to me whether her lower extremity edema is due to amlodipine or Almyra Free due to heart failure, as her central venous pressure does not seem to be very elevated.  It's possible that Lasix is not working because she has GI edema. We will switch her Lasix to Bumex 2 mg twice a day. We are referring her to the ED for chest pain. It would be helpful if she could get an echo this admission so that we can better manage her heart failure.  # Hypertension: BP well-controlled. Continue amlodipine for now, and will change her mediation based on echo results.  # CV Disease prevention: Needs lipid panel checked. She is going to the ED via EMS, so this can be checked in the hospital or at her next appointment.  # Obesity: Patient was instructed to increase her exercise to 30-40 minutes daily on most days of the week. She will begin this once MI is ruled out and CHF is better managed.  Current medicines are reviewed at length with the patient today. The patient does not have concerns regarding medicines.  The following changes have been made: Change furosemide to bumex.  Labs/ tests ordered today include:   No orders of the defined types were placed in this encounter.    Disposition: To the ED via EMS. FU with Dr. Lilli Light. [place] in 1 month.    Signed, Sharol Harness, MD  05/24/2015 8:57 AM  [facility]

## 2015-05-24 NOTE — ED Notes (Signed)
Gave patient the phone to use, patient was eating a breakfast sandwich; asked patient to stop eating until after she gets evaluated by the doctor; patient acknowledged and wrapped the sandwich up in napkins and placed on counter; visitor at bedside; Chester Holstein, RN aware

## 2015-05-24 NOTE — ED Notes (Signed)
Patient given 2 warm blankets.  

## 2015-05-25 ENCOUNTER — Encounter (HOSPITAL_COMMUNITY): Payer: Self-pay | Admitting: Family Medicine

## 2015-05-25 DIAGNOSIS — I5032 Chronic diastolic (congestive) heart failure: Secondary | ICD-10-CM

## 2015-05-25 DIAGNOSIS — E669 Obesity, unspecified: Secondary | ICD-10-CM | POA: Diagnosis not present

## 2015-05-25 DIAGNOSIS — R0789 Other chest pain: Secondary | ICD-10-CM

## 2015-05-25 DIAGNOSIS — R079 Chest pain, unspecified: Secondary | ICD-10-CM | POA: Diagnosis not present

## 2015-05-25 LAB — BASIC METABOLIC PANEL
Anion gap: 8 (ref 5–15)
BUN: 11 mg/dL (ref 6–20)
CO2: 28 mmol/L (ref 22–32)
Calcium: 9 mg/dL (ref 8.9–10.3)
Chloride: 99 mmol/L — ABNORMAL LOW (ref 101–111)
Creatinine, Ser: 0.81 mg/dL (ref 0.44–1.00)
GFR calc Af Amer: >60 mL/min (ref >60)
GFR calc non Af Amer: >60 mL/min (ref >60)
Glucose, Bld: 114 mg/dL — ABNORMAL HIGH (ref 65–99)
Potassium: 3.2 mmol/L — ABNORMAL LOW (ref 3.5–5.1)
Sodium: 135 mmol/L (ref 135–145)

## 2015-05-25 LAB — LIPID PANEL
Cholesterol: 133 mg/dL (ref 0–200)
HDL: 50 mg/dL (ref >40)
LDL Cholesterol: 62 mg/dL (ref 0–99)
Total CHOL/HDL Ratio: 2.7 RATIO
Triglycerides: 105 mg/dL (ref <150)
VLDL: 21 mg/dL (ref 0–40)

## 2015-05-25 LAB — TROPONIN I: Troponin I: <0.03 ng/mL (ref <0.031)

## 2015-05-25 MED ORDER — INFLUENZA VAC SPLIT QUAD 0.5 ML IM SUSY
0.5000 mL | PREFILLED_SYRINGE | INTRAMUSCULAR | Status: DC
  Filled 2015-05-25: qty 0.5

## 2015-05-25 NOTE — Progress Notes (Signed)
Patient was NPO for stress test. Order for stress test was not added, but has been added now. Patient became agitated and chose to eat  fried food brought in by family against orders. Had doctor insert a diet order. Went in room to tell patient that a diet was put in and found her eating chinese food, brought in by family. She is noncompliant with plan, but did voice she would try to eat dinner here tonight. She further voiced understanding that the will be NPO after midnight.

## 2015-05-25 NOTE — Progress Notes (Addendum)
    Subjective:  No further CP. Eager for stress test so she can have a full meal.  Objective:  Vital Signs in the last 24 hours: Temp:  [97.9 F (36.6 C)-98.9 F (37.2 C)] 97.9 F (36.6 C) (09/10 0842) Pulse Rate:  [71-85] 84 (09/10 0842) Resp:  [17-28] 20 (09/10 0842) BP: (104-153)/(54-88) 116/71 mmHg (09/10 0842) SpO2:  [84 %-100 %] 95 % (09/10 0842) Weight:  [229 lb 14.4 oz (104.282 kg)-230 lb 12.8 oz (104.69 kg)] 230 lb 12.8 oz (104.69 kg) (09/10 0500)  Intake/Output from previous day: 09/09 0701 - 09/10 0700 In: 360 [P.O.:360] Out: 1450 [Urine:1450]   Physical Exam: General: Well developed, well nourished, in no acute distress. Head:  Normocephalic and atraumatic. Lungs: Clear to auscultation and percussion. Heart: Normal S1 and S2.  No murmur, rubs or gallops.  Abdomen: soft, non-tender, positive bowel sounds. Obese Extremities: No clubbing or cyanosis. No edema. Neurologic: Alert and oriented x 3.    Lab Results:  Recent Labs  05/24/15 1155  WBC 8.4  HGB 11.7*  PLT 252    Recent Labs  05/24/15 1155 05/25/15 0333  NA 137 135  K 3.3* 3.2*  CL 104 99*  CO2 29 28  GLUCOSE 102* 114*  BUN 11 11  CREATININE 0.78 0.81   No results for input(s): TROPONINI in the last 72 hours.  Invalid input(s): CK, MB Hepatic Function Panel  Recent Labs  05/24/15 1155  PROT 6.7  ALBUMIN 3.2*  AST 26  ALT 34  ALKPHOS 63  BILITOT 0.5    Recent Labs  05/25/15 0333  CHOL 133   No results for input(s): PROTIME in the last 72 hours.   Telemetry: PAC's no adverse rhythms Personally viewed.   EKG:  NSR, new T wave inversions noted anterior  Cardiac Studies:  Await NUC  Echocardiogram 05/24/15 - Left ventricle: The cavity size was normal. Wall thickness was increased in a pattern of mild LVH. Systolic function was normal. The estimated ejection fraction was in the range of 60% to 65%. Wall motion was normal; there were no regional wall  motion abnormalities. Features are consistent with a pseudonormal left ventricular filling pattern, with concomitant abnormal relaxation and increased filling pressure (grade 2 diastolic dysfunction).  Assessment/Plan:  Active Problems:   Chest pain  44 year old female with atypical chest pain, recurrent multiple ER visits, bipolar, obesity.  -Awaiting nuclear stress test - EF normal, grade 2 DD -No current signs of fluid overload. Has a diagnosis of diastolic heart failure. Grade 2 diastolic dysfunction noted on echocardiogram. Obesity noted.  - Checking troponin.    Further recommendations based upon stress test. If low risk, DC.     SKAINS, MARK 05/25/2015, 10:27 AM

## 2015-05-26 ENCOUNTER — Observation Stay (HOSPITAL_COMMUNITY): Payer: Medicaid Other

## 2015-05-26 ENCOUNTER — Other Ambulatory Visit: Payer: Self-pay | Admitting: Physician Assistant

## 2015-05-26 DIAGNOSIS — R079 Chest pain, unspecified: Secondary | ICD-10-CM | POA: Diagnosis not present

## 2015-05-26 DIAGNOSIS — E876 Hypokalemia: Secondary | ICD-10-CM

## 2015-05-26 LAB — BASIC METABOLIC PANEL
Anion gap: 9 (ref 5–15)
BUN: 10 mg/dL (ref 6–20)
CO2: 32 mmol/L (ref 22–32)
Calcium: 9.2 mg/dL (ref 8.9–10.3)
Chloride: 96 mmol/L — ABNORMAL LOW (ref 101–111)
Creatinine, Ser: 0.91 mg/dL (ref 0.44–1.00)
GFR calc Af Amer: >60 mL/min (ref >60)
GFR calc non Af Amer: >60 mL/min (ref >60)
Glucose, Bld: 112 mg/dL — ABNORMAL HIGH (ref 65–99)
Potassium: 3 mmol/L — ABNORMAL LOW (ref 3.5–5.1)
Sodium: 137 mmol/L (ref 135–145)

## 2015-05-26 MED ORDER — POTASSIUM CHLORIDE ER 20 MEQ PO TBCR
20.0000 meq | EXTENDED_RELEASE_TABLET | Freq: Two times a day (BID) | ORAL | Status: DC
Start: 2015-05-26 — End: 2016-12-18

## 2015-05-26 MED ORDER — POTASSIUM CHLORIDE CRYS ER 20 MEQ PO TBCR
40.0000 meq | EXTENDED_RELEASE_TABLET | Freq: Once | ORAL | Status: AC
  Administered 2015-05-26: 40 meq via ORAL
  Filled 2015-05-26: qty 2

## 2015-05-26 MED ORDER — REGADENOSON 0.4 MG/5ML IV SOLN
0.4000 mg | Freq: Once | INTRAVENOUS | Status: AC
  Administered 2015-05-26: 0.4 mg via INTRAVENOUS

## 2015-05-26 MED ORDER — TECHNETIUM TC 99M SESTAMIBI GENERIC - CARDIOLITE
10.5000 | Freq: Once | INTRAVENOUS | Status: AC | PRN
  Administered 2015-05-26: 11 via INTRAVENOUS

## 2015-05-26 MED ORDER — PNEUMOCOCCAL VAC POLYVALENT 25 MCG/0.5ML IJ INJ: 0.5000 mL | INJECTION | INTRAMUSCULAR | Status: DC

## 2015-05-26 MED ORDER — REGADENOSON 0.4 MG/5ML IV SOLN
INTRAVENOUS | Status: AC
  Filled 2015-05-26: qty 5

## 2015-05-26 MED ORDER — TECHNETIUM TC 99M SESTAMIBI GENERIC - CARDIOLITE
30.0000 | Freq: Once | INTRAVENOUS | Status: AC | PRN
  Administered 2015-05-26: 30 via INTRAVENOUS

## 2015-05-26 NOTE — Discharge Summary (Signed)
Discharge Summary   Patient ID: Lori Bean MRN: [id number], DOB/AGE: 45-Jan-1972 45 y.o. Admit date: 05/24/2015 D/C date:     05/26/2015  Primary Cardiologist: Dr. Gaetano Hawthorne   Principal Problem:   Chest pain Active Problems:   Hypertension   Bipolar affective disorder   Schizophrenia   Tobacco abuse disorder   Obesity (BMI 30-39.9)    Admission Dates: 05/24/15-05/26/15 Discharge Diagnosis: chest pain s/p low risk nuclear stress test.   HPI: Lori Bean is a 44 y.o. female with a history of hypertension, schizophrenia, bipolar disorder, obesity, prior PE and chronic heart failure, type unknown who presented to the office on 05/24/15 with chest pain and SOB and she was sent to [facility] for admission.   Lori Bean reported increasing fatigue and shortness of breath over the last month. She states that she was diagnosed with heart failure over a year ago and has been treated by [facility] cardiology, though I do not see any records to this effect. She carries a diagnosis of congestive heart failure, though there are no echoes in the system. She's been taking Lasix 80 mg twice daily but does not get a good urinary response. She endorses increased lower extremity edema, two-pillow orthopnea, and PND. She also notes intermittent chest pain over the last month that has now been constant since 3 AM. The chest pain is in the substernal and right side of her chest and radiates to her back. It is 8 out of 10 in severity and constant in nature. It is worse with exertion. Her shortness of breath is also worse since the chest pain began. She endorses diaphoresis but denies nausea or vomiting. The pain is sharp in nature. She did move to a new residence this week, but has not been lifting any heavy boxes or items.   Lori Bean reports a 20 pound weight gain in the last one year. She does not get any exercise due to pain in her back and hip. She has been working to improve her diet recently. She  is increasing the number fruits and vegetables that she eats and decreasing the fried foods. She drinks mostly water throughout the day.  Lori Bean has been evaluated in the emergency department for chest pain in July 2015 and March 2016. During those visits she had negative cardiac enzymes and her pain improved with a GI cocktail. She states that she has been taking her Nexium and this is not helping her current chest pain. She has not ever followed up with cardiology and did not receive a stress test as planned from the emergency department. She states that she did not previously have insurance. BNP was elevated to 138 03/2014.   Hospital Course   Chest pain/SOB -- 2D ECHO this admission with EF normal, grade 2 DD -- No current signs of fluid overload. Has a diagnosis of diastolic heart failure. Grade 2 diastolic dysfunction noted on echocardiogram. She will be sent home on Bumex 2mg  BID as she did not have a good response to lasix.  -- Troponin neg x 1 and  nuclear stress test low risk. She will be discharged home and will see Dr. Gaetano Hawthorne back in the office in 1 mo  Hypokalemia- K 3.0. Will supplement today and have her follow up with her PCP.  Will place her on 20 mEq of Kdur to take with Bumex. BMET next week.   HTN- continue home regimen of amlodipine 10mg    HLD- LDL 62 on no medication.  The patient has had an uncomplicated hospital course and is recovering well. She has been seen by Dr. Marlou Porch today and deemed ready for discharge home. Smoking cessation was disscussed in length. Discharge medications are listed below.   Discharge Vitals: Blood pressure 149/77, pulse 83, temperature 97.6 F (36.4 C), temperature source Oral, resp. rate 22, height 5\' 2"  (1.575 m), weight 226 lb 13.7 oz (102.9 kg), last menstrual period 05/22/2015, SpO2 94 %.  Labs: Lab Results  Component Value Date   WBC 8.4 05/24/2015   HGB 11.7* 05/24/2015   HCT 35.1* 05/24/2015   MCV 90.5 05/24/2015    PLT 252 05/24/2015    Recent Labs Lab 05/24/15 1155  05/26/15 0301  NA 137  < > 137  K 3.3*  < > 3.0*  CL 104  < > 96*  CO2 29  < > 32  BUN 11  < > 10  CREATININE 0.78  < > 0.91  CALCIUM 9.0  < > 9.2  PROT 6.7  --   --   BILITOT 0.5  --   --   ALKPHOS 63  --   --   ALT 34  --   --   AST 26  --   --   GLUCOSE 102*  < > 112*  < > = values in this interval not displayed.  Recent Labs  05/25/15 1315  TROPONINI <0.03   Lab Results  Component Value Date   CHOL 133 05/25/2015   HDL 50 05/25/2015   LDLCALC 62 05/25/2015   TRIG 105 05/25/2015     Diagnostic Studies/Procedures   Nm Myocar Multi W/spect W/wall Motion / Ef  05/26/2015   CLINICAL DATA:  Chest pain. Murmur. Hypertension. Shortness of breath. CHF.  EXAM: MYOCARDIAL IMAGING WITH SPECT (REST AND PHARMACOLOGIC-STRESS)  GATED LEFT VENTRICULAR WALL MOTION STUDY  LEFT VENTRICULAR EJECTION FRACTION  TECHNIQUE: Standard myocardial SPECT imaging was performed after resting intravenous injection of 10 mCi Tc-56m sestamibi. Subsequently, intravenous infusion of Lexiscan was performed under the supervision of the Cardiology staff. At peak effect of the drug, 30 mCi Tc-71m sestamibi was injected intravenously and standard myocardial SPECT imaging was performed. Quantitative gated imaging was also performed to evaluate left ventricular wall motion, and estimate left ventricular ejection fraction.  COMPARISON:  None.  FINDINGS: Perfusion: No decreased activity in the left ventricle on stress imaging to suggest reversible ischemia or infarction.  Wall Motion: Mild septal hypokinesis.  Left Ventricular Ejection Fraction: 57 %  End diastolic volume 409 ml  End systolic volume 43 ml  IMPRESSION: 1. No reversible ischemia or infarction.  2. Mild septal hypokinesis.  3. Left ventricular ejection fraction 57%  4. Low-risk stress test findings*.  *2012 Appropriate Use Criteria for Coronary Revascularization Focused Update: J Am Coll Cardiol.  8119;14(7):829-562. http://content.airportbarriers.com.aspx?articleid=1201161   Electronically Signed   By: Kerby Moors M.D.   On: 05/26/2015 12:42     2D ECHO 05/24/2015 LV EF: 60% -  65% Study Conclusions - Left ventricle: The cavity size was normal. Wall thickness was increased in a pattern of mild LVH. Systolic function was normal. The estimated ejection fraction was in the range of 60% to 65%. Wall motion was normal; there were no regional wall motion abnormalities. Features are consistent with a pseudonormal left ventricular filling pattern, with concomitant abnormal relaxation and increased filling pressure (grade 2 diastolic dysfunction).  Discharge Medications     Medication List    TAKE these medications  Albuterol Sulfate 108 (90 BASE) MCG/ACT Aepb  Commonly known as:  PROAIR RESPICLICK  Inhale 2 puffs into the lungs every 4 (four) hours as needed (Every 4-6 hours as needed for wheezing).     amLODipine 10 MG tablet  Commonly known as:  NORVASC  TAKE ONE (1) TABLET BY MOUTH EVERY DAY     ARIPiprazole 20 MG tablet  Commonly known as:  ABILIFY  Take 1 tablet (20 mg total) by mouth at bedtime.     bumetanide 2 MG tablet  Commonly known as:  BUMEX  Take 1 tablet (2 mg total) by mouth daily.     gabapentin 300 MG capsule  Commonly known as:  NEURONTIN  TAKE ONE (1) CAPSULE BY MOUTH 2 TIMES DAILY     ibuprofen 800 MG tablet  Commonly known as:  ADVIL,MOTRIN  Take 1 tablet (800 mg total) by mouth 3 (three) times daily.     omeprazole 20 MG capsule  Commonly known as:  PRILOSEC  Take 1 capsule (20 mg total) by mouth daily.     oxyCODONE-acetaminophen 10-325 MG per tablet  Commonly known as:  PERCOCET  Take 1-2 tablets by mouth every 4 (four) hours as needed for pain.     Potassium Chloride ER 20 MEQ Tbcr  Take 20 mEq by mouth 2 (two) times daily.     solifenacin 5 MG tablet  Commonly known as:  VESICARE  Take 1 tablet (5 mg total)  by mouth daily.     triamcinolone cream 0.1 %  Commonly known as:  KENALOG  Apply 1 application topically 2 (two) times daily as needed (eczema). Do not apply to face        Disposition   The patient will be discharged in stable condition to home.  Follow-up Information    Follow up with Lance Bosch, NP.   Specialty:  Internal Medicine   Why:  Please follow up with your primary care provider   Contact information:   [address] [place] [address] [phone]       Follow up with Sharol Harness, MD.   Specialty:  Cardiology   Why:  The office will call you to make an appoinment., If you do not hear from them, please contact them., You will need lab work next week   Contact information:   [address] [place] [address] [phone]         Duration of Discharge Encounter: Greater than 30 minutes including physician and PA time.  Mable Fill R PA-C 05/26/2015, 4:18 PM

## 2015-05-26 NOTE — Progress Notes (Signed)
Patient Name: Lori Bean Date of Encounter: 05/26/2015     Active Problems:   Chest pain    SUBJECTIVE  Seen in nuclear medicine for lexiscan myoview. No further CP she tolerated the procedure well.   CURRENT MEDS . amLODipine  10 mg Oral Daily  . ARIPiprazole  20 mg Oral QHS  . bumetanide  2 mg Oral BID  . darifenacin  7.5 mg Oral Daily  . enoxaparin (LOVENOX) injection  40 mg Subcutaneous Q24H  . Influenza vac split quadrivalent PF  0.5 mL Intramuscular Tomorrow-1000  . nitroGLYCERIN  0.5 inch Topical 4 times per day  . pantoprazole  40 mg Oral Daily  . regadenoson      . sodium chloride  3 mL Intravenous Q12H    OBJECTIVE  Filed Vitals:   05/25/15 2353 05/26/15 0454 05/26/15 0500 05/26/15 0927  BP: 114/54 130/78  117/76  Pulse: 80 87  77  Temp: 98.4 F (36.9 C) 98.5 F (36.9 C)    TempSrc: Oral Oral    Resp: 15 16    Height:      Weight:   226 lb 13.7 oz (102.9 kg)   SpO2: 98% 98%      Intake/Output Summary (Last 24 hours) at 05/26/15 1008 Last data filed at 05/25/15 1944  Gross per 24 hour  Intake   1323 ml  Output      0 ml  Net   1323 ml   Filed Weights   05/24/15 1901 05/25/15 0500 05/26/15 0500  Weight: 229 lb 14.4 oz (104.282 kg) 230 lb 12.8 oz (104.69 kg) 226 lb 13.7 oz (102.9 kg)    PHYSICAL EXAM  General: Pleasant, NAD. Neuro: Alert and oriented X 3. Moves all extremities spontaneously. Psych: Normal affect. HEENT:  Normal  Neck: Supple without bruits or JVD. Lungs:  Resp regular and unlabored, CTA. Heart: RRR no s3, s4, or murmurs. Abdomen: Soft, non-tender, non-distended, BS + x 4.  Extremities: No clubbing, cyanosis or edema. DP/PT/Radials 2+ and equal bilaterally.  Accessory Clinical Findings  CBC  Recent Labs  05/24/15 1155  WBC 8.4  NEUTROABS 5.1  HGB 11.7*  HCT 35.1*  MCV 90.5  PLT 409   Basic Metabolic Panel  Recent Labs  05/25/15 0333 05/26/15 0301  NA 135 137  K 3.2* 3.0*  CL 99* 96*  CO2 28 32   GLUCOSE 114* 112*  BUN 11 10  CREATININE 0.81 0.91  CALCIUM 9.0 9.2   Liver Function Tests  Recent Labs  05/24/15 1155  AST 26  ALT 34  ALKPHOS 63  BILITOT 0.5  PROT 6.7  ALBUMIN 3.2*   No results for input(s): LIPASE, AMYLASE in the last 72 hours. Cardiac Enzymes  Recent Labs  05/25/15 1315  TROPONINI <0.03   Fasting Lipid Panel  Recent Labs  05/25/15 0333  CHOL 133  HDL 50  LDLCALC 62  TRIG 105  CHOLHDL 2.7    TELE  NSR  Radiology/Studies  No results found.  ASSESSMENT AND PLAN 44 year old female with atypical chest pain, recurrent multiple ER visits, bipolar, obesity.  Chest pain/SOB -- EF normal, grade 2 DD -- No current signs of fluid overload. Has a diagnosis of diastolic heart failure. Grade 2 diastolic dysfunction noted on echocardiogram. Obesity noted. -- Troponin neg x 1 -- Awaiting nuclear stress test: Further recommendations based upon stress test. If low risk, DC.   Judy Pimple PA-C  Pager [phone]  Personally seen and examined. Agree  with above. If low risk, DC Lisa-Marie Rueger, MD

## 2015-05-26 NOTE — Discharge Instructions (Signed)

## 2015-05-26 NOTE — Progress Notes (Signed)
    Going for NUC stress test If low risk OK to DC.  Candee Furbish, MD

## 2015-05-27 IMAGING — CR DG CHEST 2V
2 series · 2 of 2 positions shown · non-contrast
Comparison: Chest radiograph performed 08/31/2012, and CTA of the
chest performed 05/08/2012

CLINICAL DATA: Shortness of breath and back pain.

CHEST - 2 VIEW

[w chest lat]
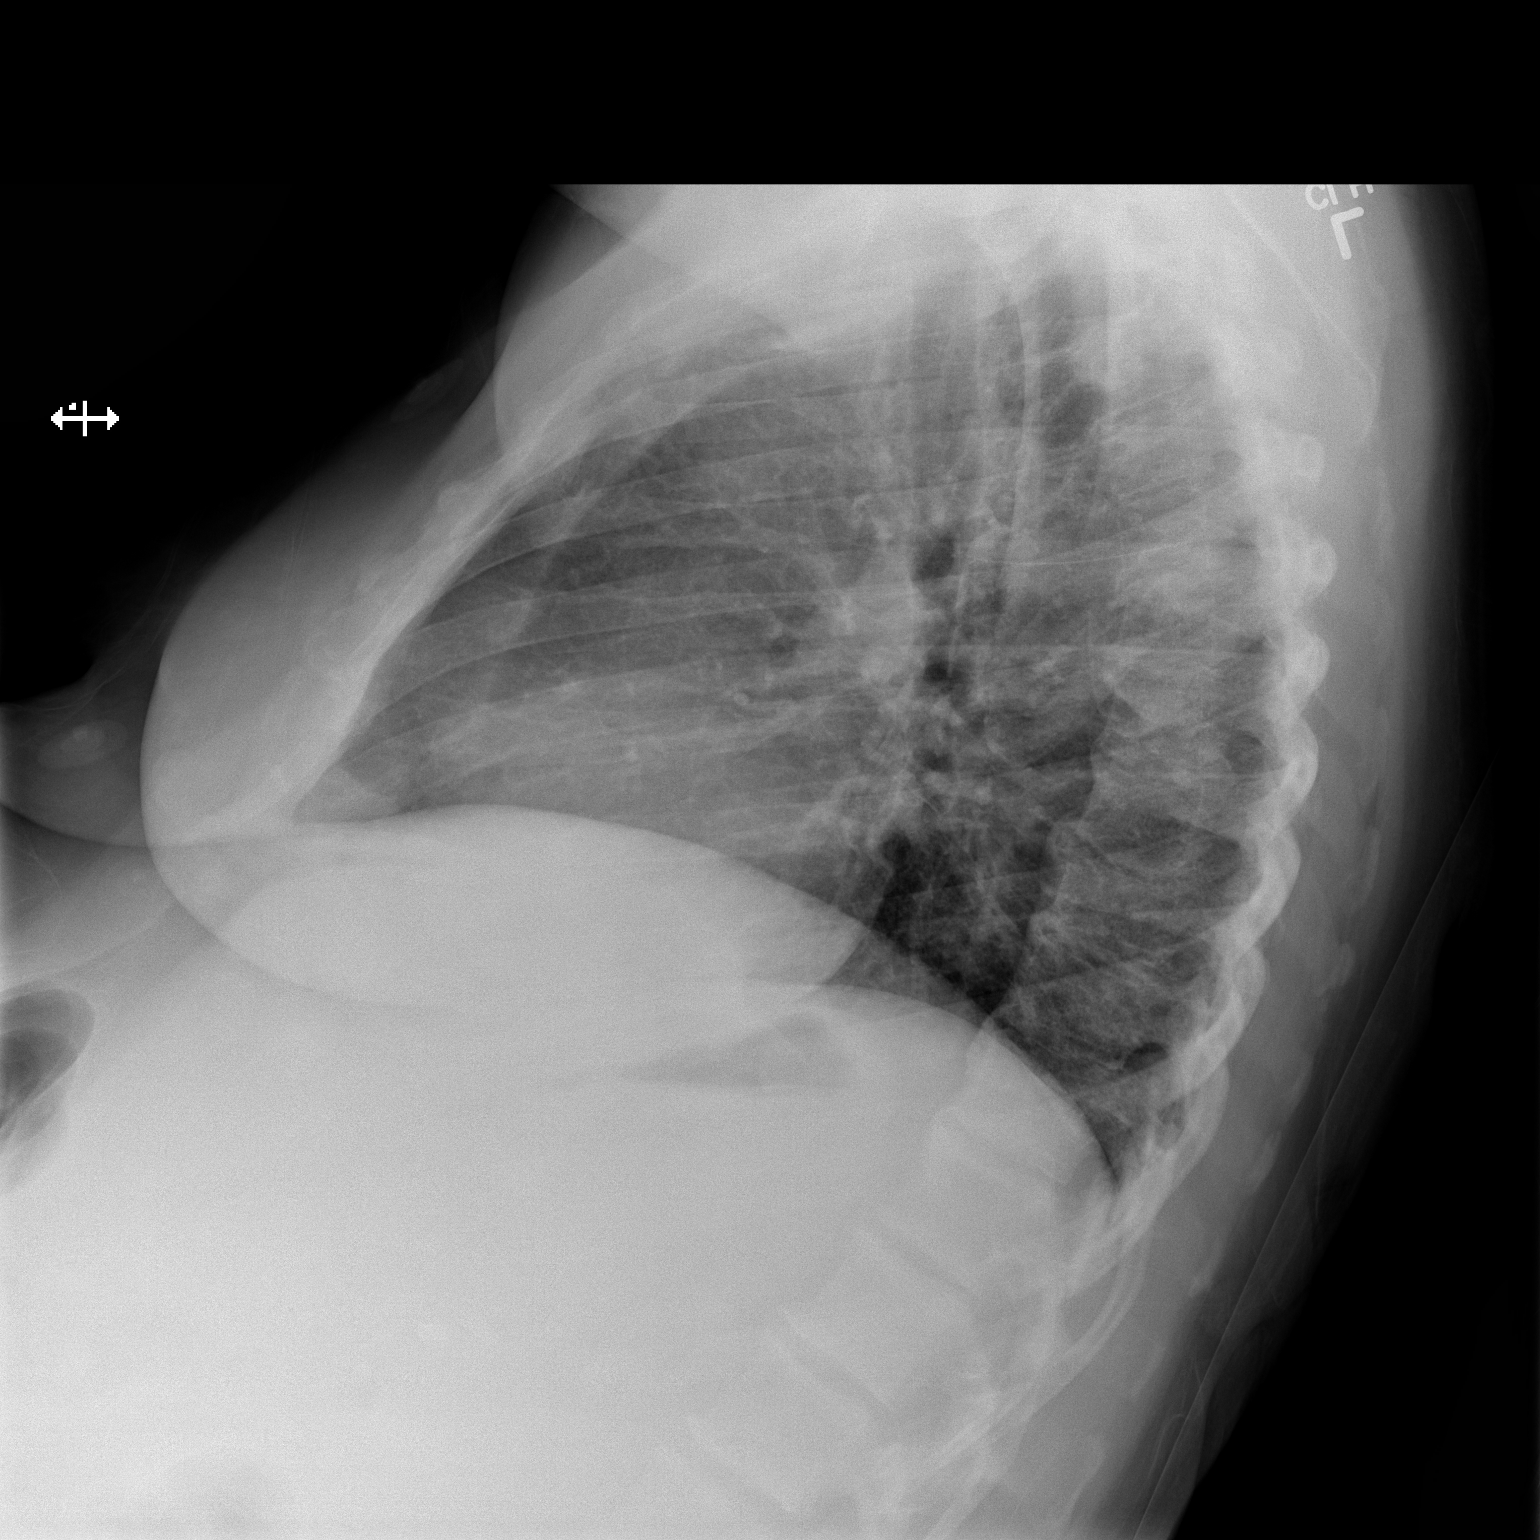

[x chest ap]
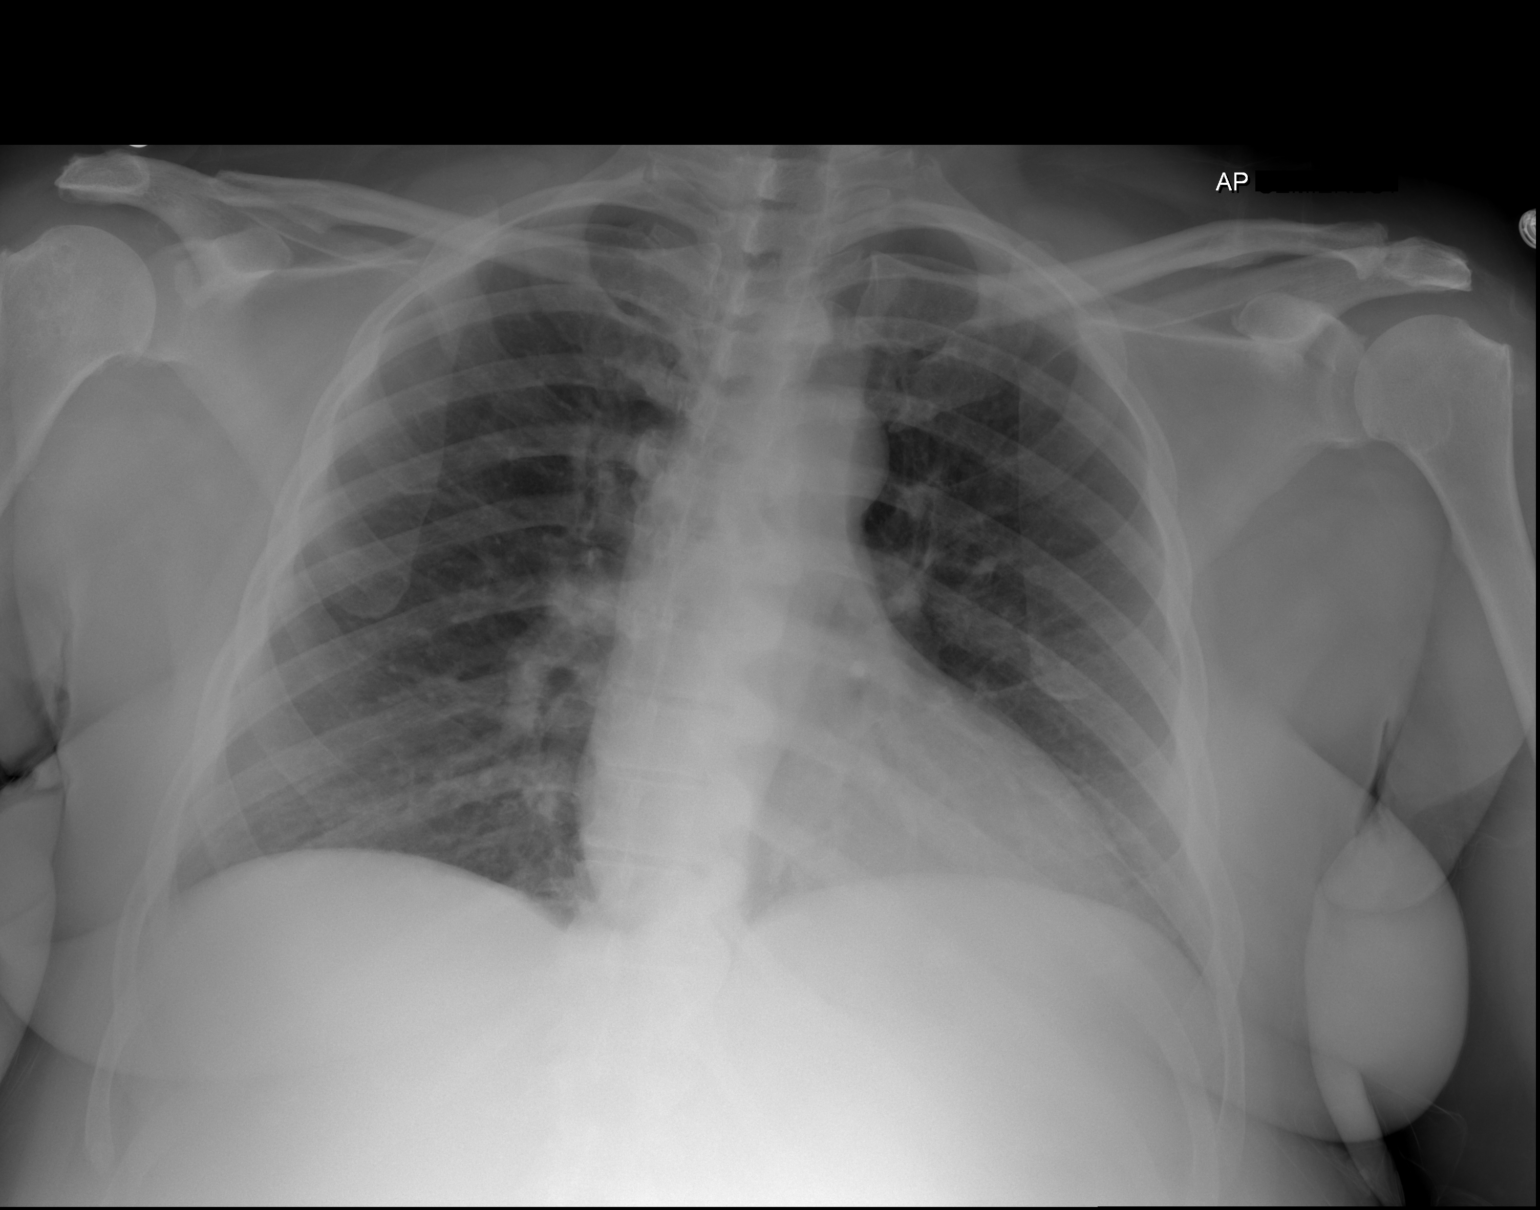

[2 of 2 positions shown; findings below may reference images not displayed]

FINDINGS: The lungs are well-aerated.  Minimal bilateral
atelectasis is noted.  There is no evidence of pleural effusion or
pneumothorax.

The heart is normal in size; the mediastinal contour is within
normal limits.  No acute osseous abnormalities are seen.  Clips are
noted within the right upper quadrant, reflecting prior
cholecystectomy.
IMPRESSION: Minimal bilateral atelectasis noted; lungs otherwise clear.

## 2015-06-03 ENCOUNTER — Other Ambulatory Visit: Payer: Medicaid Other

## 2015-06-05 ENCOUNTER — Other Ambulatory Visit: Payer: Medicaid Other

## 2015-06-19 ENCOUNTER — Encounter: Payer: Self-pay | Admitting: Internal Medicine

## 2015-06-19 ENCOUNTER — Ambulatory Visit: Payer: Medicaid Other | Attending: Internal Medicine | Admitting: Internal Medicine

## 2015-06-19 ENCOUNTER — Other Ambulatory Visit (HOSPITAL_COMMUNITY)
Admission: RE | Admit: 2015-06-19 | Discharge: 2015-06-19 | Disposition: A | Discharge status: Home / Self Care | Payer: Medicaid Other | Source: Ambulatory Visit | Attending: Internal Medicine | Admitting: Internal Medicine

## 2015-06-19 VITALS — BP 144/99 | HR 89 | Temp 99.0°F | Resp 16 | Ht 62.0 in | Wt 224.0 lb

## 2015-06-19 DIAGNOSIS — Z886 Allergy status to analgesic agent status: Secondary | ICD-10-CM | POA: Insufficient documentation

## 2015-06-19 DIAGNOSIS — F209 Schizophrenia, unspecified: Secondary | ICD-10-CM | POA: Diagnosis not present

## 2015-06-19 DIAGNOSIS — Z87891 Personal history of nicotine dependence: Secondary | ICD-10-CM | POA: Insufficient documentation

## 2015-06-19 DIAGNOSIS — Z202 Contact with and (suspected) exposure to infections with a predominantly sexual mode of transmission: Secondary | ICD-10-CM | POA: Diagnosis not present

## 2015-06-19 DIAGNOSIS — F419 Anxiety disorder, unspecified: Secondary | ICD-10-CM | POA: Diagnosis not present

## 2015-06-19 DIAGNOSIS — F319 Bipolar disorder, unspecified: Secondary | ICD-10-CM | POA: Insufficient documentation

## 2015-06-19 DIAGNOSIS — N760 Acute vaginitis: Secondary | ICD-10-CM | POA: Diagnosis present

## 2015-06-19 DIAGNOSIS — J45909 Unspecified asthma, uncomplicated: Secondary | ICD-10-CM | POA: Diagnosis not present

## 2015-06-19 DIAGNOSIS — N898 Other specified noninflammatory disorders of vagina: Secondary | ICD-10-CM | POA: Insufficient documentation

## 2015-06-19 DIAGNOSIS — Z113 Encounter for screening for infections with a predominantly sexual mode of transmission: Secondary | ICD-10-CM | POA: Insufficient documentation

## 2015-06-19 DIAGNOSIS — I10 Essential (primary) hypertension: Secondary | ICD-10-CM | POA: Diagnosis not present

## 2015-06-19 DIAGNOSIS — Z79899 Other long term (current) drug therapy: Secondary | ICD-10-CM | POA: Diagnosis not present

## 2015-06-19 DIAGNOSIS — K219 Gastro-esophageal reflux disease without esophagitis: Secondary | ICD-10-CM | POA: Diagnosis not present

## 2015-06-19 DIAGNOSIS — R197 Diarrhea, unspecified: Secondary | ICD-10-CM | POA: Insufficient documentation

## 2015-06-19 DIAGNOSIS — Z833 Family history of diabetes mellitus: Secondary | ICD-10-CM | POA: Diagnosis not present

## 2015-06-19 LAB — POCT URINALYSIS DIPSTICK
Bilirubin, UA: NEGATIVE
Glucose, UA: NEGATIVE
Ketones, UA: NEGATIVE
Nitrite, UA: NEGATIVE
Protein, UA: NEGATIVE
Spec Grav, UA: 1.025
Urobilinogen, UA: 0.2
pH, UA: 5

## 2015-06-19 LAB — COMPLETE METABOLIC PANEL WITH GFR
ALT: 18 U/L (ref 6–29)
AST: 9 U/L — ABNORMAL LOW (ref 10–35)
Albumin: 3.8 g/dL (ref 3.6–5.1)
Alkaline Phosphatase: 71 U/L (ref 33–115)
BUN: 14 mg/dL (ref 7–25)
CO2: 25 mmol/L (ref 20–31)
Calcium: 9 mg/dL (ref 8.6–10.2)
Chloride: 106 mmol/L (ref 98–110)
Creat: 0.99 mg/dL (ref 0.50–1.10)
GFR, Est African American: 80 mL/min (ref >=60)
GFR, Est Non African American: 69 mL/min (ref >=60)
Glucose, Bld: 104 mg/dL — ABNORMAL HIGH (ref 65–99)
Potassium: 4.5 mmol/L (ref 3.5–5.3)
Sodium: 139 mmol/L (ref 135–146)
Total Bilirubin: 0.3 mg/dL (ref 0.2–1.2)
Total Protein: 6.4 g/dL (ref 6.1–8.1)

## 2015-06-19 MED ORDER — METRONIDAZOLE 250 MG PO TABS
2000.0000 mg | ORAL_TABLET | Freq: Once | ORAL | Status: AC
  Administered 2015-06-19: 2000 mg via ORAL

## 2015-06-19 MED ORDER — LOPERAMIDE HCL 2 MG PO TABS: 2.0000 mg | ORAL_TABLET | Freq: Three times a day (TID) | ORAL | Status: DC | PRN

## 2015-06-19 MED ORDER — AZITHROMYCIN 500 MG PO TABS: 1000.0000 mg | ORAL_TABLET | Freq: Once | ORAL | Status: DC

## 2015-06-19 MED ORDER — CEFTRIAXONE SODIUM 1 G IJ SOLR
250.0000 mg | Freq: Once | INTRAMUSCULAR | Status: AC
  Administered 2015-06-19: 250 mg via INTRAMUSCULAR

## 2015-06-19 NOTE — Patient Instructions (Signed)
I have sent a prescription for azithromax to your pharmacy. Do not start azithromax until tomorrow to prevent upset stomach.   I have also sent Imodium to help with diarrhea. You can begin this tomorrow as well.   I have sent a referral to GI for chronic diarrhea

## 2015-06-19 NOTE — Progress Notes (Signed)
Patient complains of having loose watery stools for the past two months Patient also complains of having a vaginal discharge with an odor for about a month

## 2015-06-19 NOTE — Progress Notes (Signed)
Patient ID: Lori Bean, female   DOB: April 26, 1971, 44 y.o.   MRN: [id number]  CC: diarrhea and vaginal discharge  HPI: Lori Bean is a 44 y.o. female here today for a follow up visit.  Patient has past medical history of CHF, HTN, heart murmur, bipolar disorder, schizophrenia, asthma. Patient complains of watery diarrhea for 2 months. States that every time she goes to the restroom she has diarrhea. She states that she usually goes at least 8 times per day. She denies recent antibiotic use. She feels fatigued. She has some abdominal pain in lower abdomen and feels like a stabbing sensation. Pain is more severe after she has a bowel movement. No bloody stools, vomiting, nausea.  Patient reports that she feels like she may have been exposed to Trichomonas again. She states that she recently had unprotected sex with her partner and has developed a greenish foul smelling discharge. She has some vaginal irritation but no dysuria.   Patient has No headache, No chest pain, No Nausea, No new weakness tingling or numbness, No Cough - SOB.  Allergies  Allergen Reactions  . Aspirin Nausea And Vomiting    'It makes me throw up."   Past Medical History  Diagnosis Date  . Asthma   . Hypertension   . Anxiety   . Snoring disorder     Pt stated my boyfriend always wakes me up and tells me to breathe  . Heart murmur   . CHF (congestive heart failure) ([place])   . Shortness of breath   . Bronchitis     hx of  . Bipolar affective disorder ([place])     Schizophrenia  . Depression   . GERD (gastroesophageal reflux disease)   . Arthritis   . Overactive bladder   . Schizophrenia ([place])   . Sciatica   . Anginal pain ([place])     occ from asthma   Current Outpatient Prescriptions on File Prior to Visit  Medication Sig Dispense Refill  . Albuterol Sulfate (PROAIR RESPICLICK) 518 (90 BASE) MCG/ACT AEPB Inhale 2 puffs into the lungs every 4 (four) hours as needed (Every 4-6 hours as needed for  wheezing). 1 each 3  . amLODipine (NORVASC) 10 MG tablet TAKE ONE (1) TABLET BY MOUTH EVERY DAY 30 tablet 5  . ARIPiprazole (ABILIFY) 20 MG tablet Take 1 tablet (20 mg total) by mouth at bedtime. 30 tablet 0  . bumetanide (BUMEX) 2 MG tablet Take 1 tablet (2 mg total) by mouth daily. 60 tablet 11  . gabapentin (NEURONTIN) 300 MG capsule TAKE ONE (1) CAPSULE BY MOUTH 2 TIMES DAILY 60 capsule 0  . ibuprofen (ADVIL,MOTRIN) 800 MG tablet Take 1 tablet (800 mg total) by mouth 3 (three) times daily. 30 tablet 0  . omeprazole (PRILOSEC) 20 MG capsule Take 1 capsule (20 mg total) by mouth daily. 30 capsule 6  . potassium chloride 20 MEQ TBCR Take 20 mEq by mouth 2 (two) times daily. 60 tablet 2  . oxyCODONE-acetaminophen (PERCOCET) 10-325 MG per tablet Take 1-2 tablets by mouth every 4 (four) hours as needed for pain. (Patient taking differently: Take 1 tablet by mouth every 4 (four) hours as needed for pain. ) 90 tablet 0  . solifenacin (VESICARE) 5 MG tablet Take 1 tablet (5 mg total) by mouth daily. 30 tablet 1  . triamcinolone cream (KENALOG) 0.1 % Apply 1 application topically 2 (two) times daily as needed (eczema). Do not apply to face 30 g 2  . [DISCONTINUED] solifenacin (  VESICARE) 5 MG tablet Take 5 mg by mouth daily.     Current Facility-Administered Medications on File Prior to Visit  Medication Dose Route Frequency Provider Last Rate Last Dose  . nitroGLYCERIN (NITROSTAT) SL tablet 0.4 mg  0.4 mg Sublingual Q5 min PRN [name], MD   0.4 mg at 05/24/15 0850   Family History  Problem Relation Age of Onset  . Diabetes Mother   . Diabetes Father   . Heart disease Paternal Aunt   . Cancer Paternal Aunt    Social History   Social History  . Marital Status: Divorced    Spouse Name: N/A  . Number of Children: N/A  . Years of Education: N/A   Occupational History  . Not on file.   Social History Main Topics  . Smoking status: Former Smoker -- 0.25 packs/day for 9 years     Types: Cigarettes    Quit date: 05/23/2014  . Smokeless tobacco: Never Used  . Alcohol Use: No     Comment: quit Nov. 2014  . Drug Use: No  . Sexual Activity: Not Currently   Other Topics Concern  . Not on file   Social History Narrative    Review of Systems: Other than what is stated in HPI, all other systems are negative.   Objective:   Filed Vitals:   06/19/15 1056  BP: 144/99  Pulse: 89  Temp: 99 F (37.2 C)  Resp: 16    Physical Exam  Constitutional: She is oriented to person, place, and time.  Cardiovascular: Normal rate, regular rhythm and normal heart sounds.   Pulmonary/Chest: Effort normal and breath sounds normal.  Genitourinary: Uterus normal. Cervix exhibits no motion tenderness, no discharge and no friability. Right adnexum displays no tenderness. Left adnexum displays no tenderness. Vaginal discharge (green) found.  Musculoskeletal: She exhibits edema.  Lymphadenopathy:       Right: No inguinal adenopathy present.       Left: No inguinal adenopathy present.  Neurological: She is alert and oriented to person, place, and time.  Skin: Skin is warm and dry.     Lab Results  Component Value Date   WBC 8.4 05/24/2015   HGB 11.7* 05/24/2015   HCT 35.1* 05/24/2015   MCV 90.5 05/24/2015   PLT 252 05/24/2015   Lab Results  Component Value Date   CREATININE 0.91 05/26/2015   BUN 10 05/26/2015   NA 137 05/26/2015   K 3.0* 05/26/2015   CL 96* 05/26/2015   CO2 32 05/26/2015    Lab Results  Component Value Date   HGBA1C 6.1* 03/04/2015   Lipid Panel     Component Value Date/Time   CHOL 133 05/25/2015 0333   TRIG 105 05/25/2015 0333   HDL 50 05/25/2015 0333   CHOLHDL 2.7 05/25/2015 0333   VLDL 21 05/25/2015 0333   LDLCALC 62 05/25/2015 0333       Assessment and plan:   Amberleigh was seen today for diarrhea.  Diagnoses and all orders for this visit:  STD exposure -     cefTRIAXone (ROCEPHIN) injection 250 mg; Inject 0.25 g (250 mg total)  into the muscle once. -     metroNIDAZOLE (FLAGYL) tablet 2,000 mg; Take 8 tablets (2,000 mg total) by mouth once in office -     HIV antibody (with reflex) -     RPR -     Begin azithromycin (ZITHROMAX) 500 MG tablet; Take 2 tablets (1,000 mg total) by mouth once. Take 2  tablets one time -     Cervicovaginal ancillary only -     Cervicovaginal ancillary only I will treat for all STD's today  Vaginal discharge -     Urinalysis Dipstick See above    Diarrhea, unspecified type -     Ambulatory referral to Gastroenterology -     COMPLETE METABOLIC PANEL WITH GFR -    Begin loperamide (IMODIUM A-D) 2 MG tablet; Take 1 tablet (2 mg total) by mouth 3 (three) times daily as needed for diarrhea or loose stools. Return precautions discussed   Return in about 6 months (around 12/18/2015) for routine f/u.       Lance Bosch, [facility] [phone] 06/19/2015, 11:14 AM

## 2015-06-20 ENCOUNTER — Telehealth: Payer: Self-pay | Admitting: Internal Medicine

## 2015-06-20 LAB — CERVICOVAGINAL ANCILLARY ONLY
Chlamydia: NEGATIVE
Neisseria Gonorrhea: NEGATIVE
Wet Prep (BD Affirm): POSITIVE — AB

## 2015-06-20 LAB — RPR

## 2015-06-20 LAB — HIV ANTIBODY (ROUTINE TESTING W REFLEX): HIV 1&2 Ab, 4th Generation: NONREACTIVE

## 2015-06-20 NOTE — Telephone Encounter (Signed)
Called patient to inform her that she was positive for Trichomonas which is a STD. We addressed condom use and treatment which was completed in office during last visit. All other labs were normal.   Lance Bosch, NP 06/20/2015 6:02 PM

## 2015-06-25 ENCOUNTER — Ambulatory Visit: Payer: Medicaid Other | Admitting: Cardiovascular Disease

## 2015-07-02 ENCOUNTER — Other Ambulatory Visit: Payer: Self-pay | Admitting: Internal Medicine

## 2015-07-26 ENCOUNTER — Encounter (HOSPITAL_COMMUNITY): Payer: Self-pay | Admitting: Emergency Medicine

## 2015-07-26 ENCOUNTER — Emergency Department (INDEPENDENT_AMBULATORY_CARE_PROVIDER_SITE_OTHER)
Admission: EM | Admit: 2015-07-26 | Discharge: 2015-07-26 | Disposition: A | Discharge status: Home / Self Care | Payer: Medicaid Other | Source: Home / Self Care | Attending: Family Medicine | Admitting: Family Medicine

## 2015-07-26 DIAGNOSIS — K0889 Other specified disorders of teeth and supporting structures: Secondary | ICD-10-CM | POA: Diagnosis not present

## 2015-07-26 MED ORDER — CHLORHEXIDINE GLUCONATE 0.12 % MT SOLN: 15.0000 mL | Freq: Three times a day (TID) | OROMUCOSAL | Status: DC

## 2015-07-26 MED ORDER — CLINDAMYCIN HCL 300 MG PO CAPS: 300.0000 mg | ORAL_CAPSULE | Freq: Three times a day (TID) | ORAL | Status: DC

## 2015-07-26 MED ORDER — HYDROCODONE-ACETAMINOPHEN 5-325 MG PO TABS: 1.0000 | ORAL_TABLET | Freq: Four times a day (QID) | ORAL | Status: DC | PRN

## 2015-07-26 NOTE — ED Notes (Signed)
Patient had a temporary filling 1 month ago.  Patient reports that fell out and as of 3 weeks ago .  Patient has been fighting medicaid, offices and infection and pain.  Patient has a plan for this week

## 2015-07-26 NOTE — Discharge Instructions (Signed)
Use medicine as prescribed and see dentist as planned.

## 2015-07-26 NOTE — ED Provider Notes (Signed)
CSN: [id number]     Arrival date & time 07/26/15  1625 History   First MD Initiated Contact with Patient 07/26/15 1655     Chief Complaint  Patient presents with  . Dental Pain   (Consider location/radiation/quality/duration/timing/severity/associated sxs/prior Treatment) Patient is a 44 y.o. female presenting with tooth pain. The history is provided by the patient.  Dental Pain Location:  Lower Lower teeth location:  19/LL 1st molar Quality:  Throbbing Severity:  Moderate Onset quality:  Gradual Duration:  3 weeks Progression:  Worsening Chronicity:  New Context: dental caries and poor dentition   Relieved by:  None tried Worsened by:  Nothing tried Associated symptoms: oral lesions     Past Medical History  Diagnosis Date  . Asthma   . Hypertension   . Anxiety   . Snoring disorder     Pt stated my boyfriend always wakes me up and tells me to breathe  . Heart murmur   . CHF (congestive heart failure) ([place])   . Shortness of breath   . Bronchitis     hx of  . Bipolar affective disorder ([place])     Schizophrenia  . Depression   . GERD (gastroesophageal reflux disease)   . Arthritis   . Overactive bladder   . Schizophrenia ([place])   . Sciatica   . Anginal pain ([place])     occ from asthma   Past Surgical History  Procedure Laterality Date  . Rotator cuff repair      Right shoulder  . Tubal ligation    . Gallstones reomved    . Lumbar laminectomy/decompression microdiscectomy  04/01/2012    Procedure: LUMBAR LAMINECTOMY/DECOMPRESSION MICRODISCECTOMY 2 LEVELS;  Surgeon: Faythe Ghee, MD;  Location: [facility];  Service: Neurosurgery;  Laterality: Left;  Lumbar four-five, lumbar five sacral one microdiscectomy   . Lumbar wound debridement  04/29/2012    Procedure: LUMBAR WOUND DEBRIDEMENT;  Surgeon: Faythe Ghee, MD;  Location: [facility];  Service: Neurosurgery;  Laterality: N/A;  lumbar wound debridement  . Cholecystectomy    . Eye surgery      Metal plate in  right eye  . Back surgery      3 back surgeries   Family History  Problem Relation Age of Onset  . Diabetes Mother   . Diabetes Father   . Heart disease Paternal Aunt   . Cancer Paternal Aunt    Social History  Substance Use Topics  . Smoking status: Former Smoker -- 0.25 packs/day for 9 years    Types: Cigarettes    Quit date: 05/23/2014  . Smokeless tobacco: Never Used  . Alcohol Use: No     Comment: quit Nov. 2014   OB History    No data available     Review of Systems  Constitutional: Negative.   HENT: Positive for dental problem and mouth sores. Negative for ear pain.     Allergies  Aspirin  Home Medications   Prior to Admission medications   Medication Sig Start Date End Date Taking? Authorizing Provider  Albuterol Sulfate (PROAIR RESPICLICK) [id number] (90 BASE) MCG/ACT AEPB Inhale 2 puffs into the lungs every 4 (four) hours as needed (Every 4-6 hours as needed for wheezing). 03/04/15   Lance Bosch, NP  amLODipine (NORVASC) 10 MG tablet TAKE ONE (1) TABLET BY MOUTH EVERY DAY 03/04/15   Lance Bosch, NP  ARIPiprazole (ABILIFY) 20 MG tablet Take 1 tablet (20 mg total) by mouth at bedtime. 03/04/15  Lance Bosch, NP  azithromycin (ZITHROMAX) 500 MG tablet Take 2 tablets (1,000 mg total) by mouth once. Take 2 tablets one time 06/19/15   Lance Bosch, NP  bumetanide (BUMEX) 2 MG tablet Take 1 tablet (2 mg total) by mouth daily. 05/24/15   [name], MD  chlorhexidine (PERIDEX) 0.12 % solution Use as directed 15 mLs in the mouth or throat 3 (three) times daily. 07/26/15   Billy Fischer, MD  clindamycin (CLEOCIN) 300 MG capsule Take 1 capsule (300 mg total) by mouth 3 (three) times daily. 07/26/15   Billy Fischer, MD  gabapentin (NEURONTIN) 300 MG capsule TAKE ONE (1) CAPSULE BY MOUTH 2 TIMES DAILY 01/22/15   Lance Bosch, NP  HYDROcodone-acetaminophen (NORCO/VICODIN) 5-325 MG tablet Take 1 tablet by mouth every 6 (six) hours as needed. 07/26/15   Billy Fischer, MD    ibuprofen (ADVIL,MOTRIN) 800 MG tablet Take 1 tablet (800 mg total) by mouth 3 (three) times daily. 04/21/15   Melony Overly, MD  loperamide (IMODIUM A-D) 2 MG tablet Take 1 tablet (2 mg total) by mouth 3 (three) times daily as needed for diarrhea or loose stools. 06/19/15   Lance Bosch, NP  omeprazole (PRILOSEC) 20 MG capsule Take 1 capsule (20 mg total) by mouth daily. 03/04/15   Lance Bosch, NP  oxyCODONE-acetaminophen (PERCOCET) 10-325 MG per tablet Take 1-2 tablets by mouth every 4 (four) hours as needed for pain. Patient taking differently: Take 1 tablet by mouth every 4 (four) hours as needed for pain.  01/02/14   Earnie Larsson, MD  potassium chloride 20 MEQ TBCR Take 20 mEq by mouth 2 (two) times daily. 05/26/15   Eileen Stanford, PA-C  PROAIR HFA 108 (90 BASE) MCG/ACT inhaler INHALE TWO PUFFS INTO THE LUNGS EVERY SIX HOURS AS NEEDED FOR WHEEZING OR SHORTNESS OF BREATH 07/25/15   Lance Bosch, NP  solifenacin (VESICARE) 5 MG tablet Take 1 tablet (5 mg total) by mouth daily. 02/21/14   Lance Bosch, NP  triamcinolone cream (KENALOG) 0.1 % Apply 1 application topically 2 (two) times daily as needed (eczema). Do not apply to face 02/21/14   Lance Bosch, NP   Meds Ordered and Administered this Visit  Medications - No data to display  LMP 07/02/2015 No data found.   Physical Exam  Constitutional: She appears well-developed and well-nourished. She appears distressed.  HENT:  Head: Normocephalic.  Right Ear: External ear normal.  Left Ear: External ear normal.  Mouth/Throat: Uvula is midline, oropharynx is clear and moist and mucous membranes are normal. Abnormal dentition. Dental abscesses and dental caries present.    Eyes: Pupils are equal, round, and reactive to light.  Nursing note and vitals reviewed.   ED Course  Procedures (including critical care time)  Labs Review Labs Reviewed - No data to display  Imaging Review No results found.   Visual Acuity  Review  Right Eye Distance:   Left Eye Distance:   Bilateral Distance:    Right Eye Near:   Left Eye Near:    Bilateral Near:         MDM   1. Pain, dental        Billy Fischer, MD 07/26/15 [phone]

## 2015-08-17 ENCOUNTER — Emergency Department (HOSPITAL_COMMUNITY)
Admission: EM | Admit: 2015-08-17 | Discharge: 2015-08-17 | Disposition: A | Discharge status: Home / Self Care | Payer: Medicaid Other | Attending: Emergency Medicine | Admitting: Emergency Medicine

## 2015-08-17 ENCOUNTER — Emergency Department (HOSPITAL_COMMUNITY): Payer: Medicaid Other

## 2015-08-17 ENCOUNTER — Encounter (HOSPITAL_COMMUNITY): Payer: Self-pay | Admitting: Emergency Medicine

## 2015-08-17 ENCOUNTER — Emergency Department (HOSPITAL_BASED_OUTPATIENT_CLINIC_OR_DEPARTMENT_OTHER)
Admit: 2015-08-17 | Discharge: 2015-08-17 | Disposition: A | Discharge status: Home / Self Care | Payer: Medicaid Other | Attending: Emergency Medicine | Admitting: Emergency Medicine

## 2015-08-17 DIAGNOSIS — F419 Anxiety disorder, unspecified: Secondary | ICD-10-CM | POA: Diagnosis not present

## 2015-08-17 DIAGNOSIS — Z7901 Long term (current) use of anticoagulants: Secondary | ICD-10-CM | POA: Insufficient documentation

## 2015-08-17 DIAGNOSIS — J45901 Unspecified asthma with (acute) exacerbation: Secondary | ICD-10-CM | POA: Insufficient documentation

## 2015-08-17 DIAGNOSIS — I509 Heart failure, unspecified: Secondary | ICD-10-CM | POA: Diagnosis not present

## 2015-08-17 DIAGNOSIS — Z79899 Other long term (current) drug therapy: Secondary | ICD-10-CM | POA: Diagnosis not present

## 2015-08-17 DIAGNOSIS — M5430 Sciatica, unspecified side: Secondary | ICD-10-CM | POA: Insufficient documentation

## 2015-08-17 DIAGNOSIS — F209 Schizophrenia, unspecified: Secondary | ICD-10-CM | POA: Diagnosis not present

## 2015-08-17 DIAGNOSIS — Z8742 Personal history of other diseases of the female genital tract: Secondary | ICD-10-CM | POA: Insufficient documentation

## 2015-08-17 DIAGNOSIS — M79609 Pain in unspecified limb: Secondary | ICD-10-CM | POA: Diagnosis not present

## 2015-08-17 DIAGNOSIS — K219 Gastro-esophageal reflux disease without esophagitis: Secondary | ICD-10-CM | POA: Diagnosis not present

## 2015-08-17 DIAGNOSIS — M7989 Other specified soft tissue disorders: Secondary | ICD-10-CM

## 2015-08-17 DIAGNOSIS — I10 Essential (primary) hypertension: Secondary | ICD-10-CM | POA: Diagnosis not present

## 2015-08-17 DIAGNOSIS — R2242 Localized swelling, mass and lump, left lower limb: Secondary | ICD-10-CM | POA: Diagnosis not present

## 2015-08-17 DIAGNOSIS — R011 Cardiac murmur, unspecified: Secondary | ICD-10-CM | POA: Diagnosis not present

## 2015-08-17 DIAGNOSIS — Z87891 Personal history of nicotine dependence: Secondary | ICD-10-CM | POA: Diagnosis not present

## 2015-08-17 DIAGNOSIS — Z86718 Personal history of other venous thrombosis and embolism: Secondary | ICD-10-CM | POA: Insufficient documentation

## 2015-08-17 DIAGNOSIS — Z791 Long term (current) use of non-steroidal anti-inflammatories (NSAID): Secondary | ICD-10-CM | POA: Diagnosis not present

## 2015-08-17 DIAGNOSIS — J209 Acute bronchitis, unspecified: Secondary | ICD-10-CM | POA: Diagnosis not present

## 2015-08-17 DIAGNOSIS — F319 Bipolar disorder, unspecified: Secondary | ICD-10-CM | POA: Diagnosis not present

## 2015-08-17 DIAGNOSIS — M79662 Pain in left lower leg: Secondary | ICD-10-CM | POA: Diagnosis present

## 2015-08-17 DIAGNOSIS — M1990 Unspecified osteoarthritis, unspecified site: Secondary | ICD-10-CM | POA: Diagnosis not present

## 2015-08-17 DIAGNOSIS — Z792 Long term (current) use of antibiotics: Secondary | ICD-10-CM | POA: Diagnosis not present

## 2015-08-17 DIAGNOSIS — R0600 Dyspnea, unspecified: Secondary | ICD-10-CM

## 2015-08-17 LAB — BASIC METABOLIC PANEL
Anion gap: 7 (ref 5–15)
BUN: 7 mg/dL (ref 6–20)
CO2: 27 mmol/L (ref 22–32)
Calcium: 9.1 mg/dL (ref 8.9–10.3)
Chloride: 105 mmol/L (ref 101–111)
Creatinine, Ser: 0.82 mg/dL (ref 0.44–1.00)
GFR calc Af Amer: >60 mL/min (ref >60)
GFR calc non Af Amer: >60 mL/min (ref >60)
Glucose, Bld: 109 mg/dL — ABNORMAL HIGH (ref 65–99)
Potassium: 3.4 mmol/L — ABNORMAL LOW (ref 3.5–5.1)
Sodium: 139 mmol/L (ref 135–145)

## 2015-08-17 LAB — CBC
HCT: 36.5 % (ref 36.0–46.0)
Hemoglobin: 12.1 g/dL (ref 12.0–15.0)
MCH: 29.8 pg (ref 26.0–34.0)
MCHC: 33.2 g/dL (ref 30.0–36.0)
MCV: 89.9 fL (ref 78.0–100.0)
Platelets: 268 10*3/uL (ref 150–400)
RBC: 4.06 MIL/uL (ref 3.87–5.11)
RDW: 13.3 % (ref 11.5–15.5)
WBC: 7.7 10*3/uL (ref 4.0–10.5)

## 2015-08-17 LAB — I-STAT TROPONIN, ED: Troponin i, poc: 0 ng/mL (ref 0.00–0.08)

## 2015-08-17 MED ORDER — IOHEXOL 350 MG/ML SOLN
100.0000 mL | Freq: Once | INTRAVENOUS | Status: AC | PRN
  Administered 2015-08-17: 70 mL via INTRAVENOUS

## 2015-08-17 NOTE — Progress Notes (Signed)
VASCULAR LAB PRELIMINARY  PRELIMINARY  PRELIMINARY  PRELIMINARY  Left lower extremity venous duplex completed.    Preliminary report:  There is no DVT or SVT noted in the left lower extremity.  Joanne Salah, RVT 08/17/2015, 5:53 PM

## 2015-08-17 NOTE — ED Provider Notes (Signed)
CSN: [id number]     Arrival date & time 08/17/15  1214 History   First MD Initiated Contact with Patient 08/17/15 1433     Chief Complaint  Patient presents with  . Shortness of Breath  . Leg Pain     (Consider location/radiation/quality/duration/timing/severity/associated sxs/prior Treatment) HPI Comments: Patient is a 44 year old female with history of prior DVT. This was diagnosed one year ago. She was on xarelto until it was stopped 6 months ago. She presents today with a two-week history of pain in her left lower leg and calf. She is also having discomfort in her chest and feels short of breath. She denies any fevers, chills, or cough.  Patient is a 44 y.o. female presenting with shortness of breath. The history is provided by the patient.  Shortness of Breath Severity:  Moderate Onset quality:  Gradual Duration:  2 weeks Timing:  Constant Progression:  Worsening Chronicity:  New Relieved by:  Nothing Worsened by:  Nothing tried Ineffective treatments:  None tried   Past Medical History  Diagnosis Date  . Asthma   . Hypertension   . Anxiety   . Snoring disorder     Pt stated my boyfriend always wakes me up and tells me to breathe  . Heart murmur   . CHF (congestive heart failure) ([place])   . Shortness of breath   . Bronchitis     hx of  . Bipolar affective disorder ([place])     Schizophrenia  . Depression   . GERD (gastroesophageal reflux disease)   . Arthritis   . Overactive bladder   . Schizophrenia ([place])   . Sciatica   . Anginal pain ([place])     occ from asthma   Past Surgical History  Procedure Laterality Date  . Rotator cuff repair      Right shoulder  . Tubal ligation    . Gallstones reomved    . Lumbar laminectomy/decompression microdiscectomy  04/01/2012    Procedure: LUMBAR LAMINECTOMY/DECOMPRESSION MICRODISCECTOMY 2 LEVELS;  Surgeon: Faythe Ghee, MD;  Location: [facility];  Service: Neurosurgery;  Laterality: Left;  Lumbar four-five, lumbar five  sacral one microdiscectomy   . Lumbar wound debridement  04/29/2012    Procedure: LUMBAR WOUND DEBRIDEMENT;  Surgeon: Faythe Ghee, MD;  Location: [facility];  Service: Neurosurgery;  Laterality: N/A;  lumbar wound debridement  . Cholecystectomy    . Eye surgery      Metal plate in right eye  . Back surgery      3 back surgeries   Family History  Problem Relation Age of Onset  . Diabetes Mother   . Diabetes Father   . Heart disease Paternal Aunt   . Cancer Paternal Aunt    Social History  Substance Use Topics  . Smoking status: Former Smoker -- 0.25 packs/day for 9 years    Types: Cigarettes    Quit date: 05/23/2014  . Smokeless tobacco: Never Used  . Alcohol Use: No     Comment: quit Nov. 2014   OB History    No data available     Review of Systems  Respiratory: Positive for shortness of breath.   All other systems reviewed and are negative.     Allergies  Aspirin  Home Medications   Prior to Admission medications   Medication Sig Start Date End Date Taking? Authorizing Provider  Albuterol Sulfate (PROAIR RESPICLICK) 123XX123 (90 BASE) MCG/ACT AEPB Inhale 2 puffs into the lungs every 4 (four) hours as  needed (Every 4-6 hours as needed for wheezing). 03/04/15   Lance Bosch, NP  amLODipine (NORVASC) 10 MG tablet TAKE ONE (1) TABLET BY MOUTH EVERY DAY 03/04/15   Lance Bosch, NP  ARIPiprazole (ABILIFY) 20 MG tablet Take 1 tablet (20 mg total) by mouth at bedtime. 03/04/15   Lance Bosch, NP  azithromycin (ZITHROMAX) 500 MG tablet Take 2 tablets (1,000 mg total) by mouth once. Take 2 tablets one time 06/19/15   Lance Bosch, NP  bumetanide (BUMEX) 2 MG tablet Take 1 tablet (2 mg total) by mouth daily. 05/24/15   [name], MD  chlorhexidine (PERIDEX) 0.12 % solution Use as directed 15 mLs in the mouth or throat 3 (three) times daily. 07/26/15   Billy Fischer, MD  clindamycin (CLEOCIN) 300 MG capsule Take 1 capsule (300 mg total) by mouth 3 (three) times daily.  07/26/15   Billy Fischer, MD  gabapentin (NEURONTIN) 300 MG capsule TAKE ONE (1) CAPSULE BY MOUTH 2 TIMES DAILY 01/22/15   Lance Bosch, NP  HYDROcodone-acetaminophen (NORCO/VICODIN) 5-325 MG tablet Take 1 tablet by mouth every 6 (six) hours as needed. 07/26/15   Billy Fischer, MD  ibuprofen (ADVIL,MOTRIN) 800 MG tablet Take 1 tablet (800 mg total) by mouth 3 (three) times daily. 04/21/15   Melony Overly, MD  loperamide (IMODIUM A-D) 2 MG tablet Take 1 tablet (2 mg total) by mouth 3 (three) times daily as needed for diarrhea or loose stools. 06/19/15   Lance Bosch, NP  omeprazole (PRILOSEC) 20 MG capsule Take 1 capsule (20 mg total) by mouth daily. 03/04/15   Lance Bosch, NP  oxyCODONE-acetaminophen (PERCOCET) 10-325 MG per tablet Take 1-2 tablets by mouth every 4 (four) hours as needed for pain. Patient taking differently: Take 1 tablet by mouth every 4 (four) hours as needed for pain.  01/02/14   Earnie Larsson, MD  potassium chloride 20 MEQ TBCR Take 20 mEq by mouth 2 (two) times daily. 05/26/15   Eileen Stanford, PA-C  PROAIR HFA 108 (90 BASE) MCG/ACT inhaler INHALE TWO PUFFS INTO THE LUNGS EVERY SIX HOURS AS NEEDED FOR WHEEZING OR SHORTNESS OF BREATH 07/25/15   Lance Bosch, NP  solifenacin (VESICARE) 5 MG tablet Take 1 tablet (5 mg total) by mouth daily. 02/21/14   Lance Bosch, NP  triamcinolone cream (KENALOG) 0.1 % Apply 1 application topically 2 (two) times daily as needed (eczema). Do not apply to face 02/21/14   Lance Bosch, NP   BP 156/101 mmHg  Pulse 81  Temp(Src) 98.1 F (36.7 C) (Oral)  Resp 18  SpO2 100%  LMP 08/04/2015 Physical Exam  Constitutional: She is oriented to person, place, and time. She appears well-developed and well-nourished. No distress.  HENT:  Head: Normocephalic and atraumatic.  Neck: Normal range of motion. Neck supple.  Cardiovascular: Normal rate and regular rhythm.  Exam reveals no gallop and no friction rub.   No murmur heard. Pulmonary/Chest:  Effort normal and breath sounds normal. No respiratory distress. She has no wheezes. She has no rales.  Abdominal: Soft. Bowel sounds are normal. She exhibits no distension. There is no tenderness.  Musculoskeletal: Normal range of motion.  There is tenderness to palpation of the left calf and Achilles region. There is no significant swelling or edema. There is no redness or warmth. DP pulses are easily palpable bilaterally.  Neurological: She is alert and oriented to person, place, and time.  Skin: Skin  is warm and dry. She is not diaphoretic.  Nursing note and vitals reviewed.   ED Course  Procedures (including critical care time) Labs Review Labs Reviewed  BASIC METABOLIC PANEL - Abnormal; Notable for the following:    Potassium 3.4 (*)    Glucose, Bld 109 (*)    All other components within normal limits  CBC  I-STAT TROPOININ, ED    Imaging Review Dg Chest 2 View  08/17/2015  CLINICAL DATA:  Productive cough for 1 week EXAM: CHEST  2 VIEW COMPARISON:  04/21/2015 FINDINGS: Normal heart size and mediastinal contours. No acute infiltrate or edema. No effusion or pneumothorax. No acute osseous findings. IMPRESSION: Negative chest Electronically Signed   By: Monte Fantasia M.D.   On: 08/17/2015 13:24   I have personally reviewed and evaluated these images and lab results as part of my medical decision-making.   EKG Interpretation   Date/Time:  Saturday August 17 2015 12:29:14 EST Ventricular Rate:  82 PR Interval:  170 QRS Duration: 88 QT Interval:  420 QTC Calculation: 490 R Axis:   48 Text Interpretation:  Normal sinus rhythm Cannot rule out Anterior infarct  , age undetermined Abnormal ECG since last tracing no significant change  Confirmed by Eulis Foster  MD, ELLIOTT [phone]) on 08/17/2015 6:02:01 PM      MDM   Final diagnoses:  None    Patient with history of prior DVT. She had been on xarelto for 6 months and stopped taking this approximately 6 months ago. She  presents today with complaints of chest discomfort and pain to her left calf. Her complaints are concerning for recurrent thromboembolism. She underwent a CT Angio of the chest which was negative. The remainder of her cardiac workup is unremarkable. Her EKG shows nonspecific T wave abnormalities which are unchanged from prior study. Her troponin is negative.  She will now undergo an ultrasound of her left leg to rule out possibility of deep venous thrombosis. Care will be signed out to the oncoming provider, Dr. Eulis Foster, at shift change. He will obtain the results of the study and determine the final disposition.    Veryl Speak, MD 08/18/15 [phone]

## 2015-08-17 NOTE — Discharge Instructions (Signed)
Elevate your legs to help decrease the swelling. Lie to avoid eating foods, containing salt. The CAT scan of your chest did show adrenal hyperplasia, which could be related to your high blood pressure. Please talk to your doctor about that when you see them next week.   Edema Edema is an abnormal buildup of fluids in your bodytissues. Edema is somewhatdependent on gravity to pull the fluid to the lowest place in your body. That makes the condition more common in the legs and thighs (lower extremities). Painless swelling of the feet and ankles is common and becomes more likely as you get older. It is also common in looser tissues, like around your eyes.  When the affected area is squeezed, the fluid may move out of that spot and leave a dent for a few moments. This dent is called pitting.  CAUSES  There are many possible causes of edema. Eating too much salt and being on your feet or sitting for a long time can cause edema in your legs and ankles. Hot weather may make edema worse. Common medical causes of edema include:  Heart failure.  Liver disease.  Kidney disease.  Weak blood vessels in your legs.  Cancer.  An injury.  Pregnancy.  Some medications.  Obesity. SYMPTOMS  Edema is usually painless.Your skin may look swollen or shiny.  DIAGNOSIS  Your health care provider may be able to diagnose edema by asking about your medical history and doing a physical exam. You may need to have tests such as X-rays, an electrocardiogram, or blood tests to check for medical conditions that may cause edema.  TREATMENT  Edema treatment depends on the cause. If you have heart, liver, or kidney disease, you need the treatment appropriate for these conditions. General treatment may include:  Elevation of the affected body part above the level of your heart.  Compression of the affected body part. Pressure from elastic bandages or support stockings squeezes the tissues and forces fluid back  into the blood vessels. This keeps fluid from entering the tissues.  Restriction of fluid and salt intake.  Use of a water pill (diuretic). These medications are appropriate only for some types of edema. They pull fluid out of your body and make you urinate more often. This gets rid of fluid and reduces swelling, but diuretics can have side effects. Only use diuretics as directed by your health care provider. HOME CARE INSTRUCTIONS   Keep the affected body part above the level of your heart when you are lying down.   Do not sit still or stand for prolonged periods.   Do not put anything directly under your knees when lying down.  Do not wear constricting clothing or garters on your upper legs.   Exercise your legs to work the fluid back into your blood vessels. This may help the swelling go down.   Wear elastic bandages or support stockings to reduce ankle swelling as directed by your health care provider.   Eat a low-salt diet to reduce fluid if your health care provider recommends it.   Only take medicines as directed by your health care provider. SEEK MEDICAL CARE IF:   Your edema is not responding to treatment.  You have heart, liver, or kidney disease and notice symptoms of edema.  You have edema in your legs that does not improve after elevating them.   You have sudden and unexplained weight gain. SEEK IMMEDIATE MEDICAL CARE IF:   You develop shortness of breath or  chest pain.   You cannot breathe when you lie down.  You develop pain, redness, or warmth in the swollen areas.   You have heart, liver, or kidney disease and suddenly get edema.  You have a fever and your symptoms suddenly get worse. MAKE SURE YOU:   Understand these instructions.  Will watch your condition.  Will get help right away if you are not doing well or get worse.   This information is not intended to replace advice given to you by your health care provider. Make sure you discuss  any questions you have with your health care provider.   Document Released: 08/31/2005 Document Revised: 09/21/2014 Document Reviewed: 06/23/2013 Elsevier Interactive Patient Education [employer].

## 2015-08-17 NOTE — ED Notes (Signed)
Pt sts left leg pain and swelling and SOB; pt sts taken off blood thinners recently after having blood clots

## 2015-08-17 NOTE — ED Provider Notes (Signed)
Patient seen at checkout from Dr. Stark Jock to evaluate after return of Doppler imaging. Doppler of left leg is negative for DVT.  Assessment- nonspecific left leg pain. Incidental adrenal hyperplasia, on CT angiogram. Negative leg Doppler. She has a history of hypertension. She is apparently not been evaluated for adrenal hyperplasia, previously. She has no obvious signs of Cushing syndrome.  Nursing Notes Reviewed/ Care Coordinated Applicable Imaging Reviewed Interpretation of Laboratory Data incorporated into ED treatment  The patient appears reasonably screened and/or stabilized for discharge and I doubt any other medical condition or other [facility] requiring further screening, evaluation, or treatment in the ED at this time prior to discharge.  Plan: Home Medications- usual; Home Treatments- rest, elevate legs; return here if the recommended treatment, does not improve the symptoms; Recommended follow up- PCP 1 week to discuss CT results     Results for orders placed or performed during the hospital encounter of 123XX123  Basic metabolic panel  Result Value Ref Range   Sodium 139 135 - 145 mmol/L   Potassium 3.4 (L) 3.5 - 5.1 mmol/L   Chloride 105 101 - 111 mmol/L   CO2 27 22 - 32 mmol/L   Glucose, Bld 109 (H) 65 - 99 mg/dL   BUN 7 6 - 20 mg/dL   Creatinine, Ser 0.82 0.44 - 1.00 mg/dL   Calcium 9.1 8.9 - 10.3 mg/dL   GFR calc non Af Amer >60 >60 mL/min   GFR calc Af Amer >60 >60 mL/min   Anion gap 7 5 - 15  CBC  Result Value Ref Range   WBC 7.7 4.0 - 10.5 K/uL   RBC 4.06 3.87 - 5.11 MIL/uL   Hemoglobin 12.1 12.0 - 15.0 g/dL   HCT 36.5 36.0 - 46.0 %   MCV 89.9 78.0 - 100.0 fL   MCH 29.8 26.0 - 34.0 pg   MCHC 33.2 30.0 - 36.0 g/dL   RDW 13.3 11.5 - 15.5 %   Platelets 268 150 - 400 K/uL  I-stat troponin, ED (not at [facility], [facility])  Result Value Ref Range   Troponin i, poc 0.00 0.00 - 0.08 ng/mL   Comment 3          Dg Chest 2 View  08/17/2015  CLINICAL DATA:  Productive cough for 1  week EXAM: CHEST  2 VIEW COMPARISON:  04/21/2015 FINDINGS: Normal heart size and mediastinal contours. No acute infiltrate or edema. No effusion or pneumothorax. No acute osseous findings. IMPRESSION: Negative chest Electronically Signed   By: Monte Fantasia M.D.   On: 08/17/2015 13:24   Ct Angio Chest Pe W/cm &/or Wo Cm  08/17/2015  CLINICAL DATA:  Chest pain. Left lower extremity pain and swelling. Shortness of breath. History of DVT. EXAM: CT ANGIOGRAPHY CHEST WITH CONTRAST TECHNIQUE: Multidetector CT imaging of the chest was performed using the standard protocol during bolus administration of intravenous contrast. Multiplanar CT image reconstructions and MIPs were obtained to evaluate the vascular anatomy. CONTRAST:  51mL OMNIPAQUE IOHEXOL 350 MG/ML SOLN COMPARISON:  04/03/2014 chest CT angiogram. FINDINGS: Mediastinum/Nodes: The study is high quality for the evaluation of pulmonary embolism. There are no filling defects in the central, lobar, segmental or subsegmental pulmonary artery branches to suggest acute pulmonary embolism. Great vessels are normal in course and caliber. Top-normal heart size, which appears increased. No pericardial fluid/thickening. Normal visualized thyroid. Normal esophagus. No pathologically enlarged axillary, mediastinal or hilar lymph nodes. Lungs/Pleura: No pneumothorax. No pleural effusion. Right upper lobe solid 4 mm pulmonary nodule (  series 5/ image 43) appears stable since 04/03/2014, suggesting a benign etiology. Tiny calcified granuloma in the right upper lobe is stable. No acute consolidative airspace disease, new significant pulmonary nodules or lung masses. Mild hypoventilatory change in the dependent lungs. Upper abdomen: Mild diffuse thickening of the left greater than right adrenal glands, unchanged, in keeping with mild adrenal hyperplasia. Musculoskeletal: No aggressive appearing focal osseous lesions. Flowing osteophytes in the mid to lower thoracic spine,  suggesting diffuse idiopathic skeletal hyperostosis. Review of the MIP images confirms the above findings. IMPRESSION: 1. No pulmonary embolism. 2. No acute consolidative airspace disease. 3. Adrenal hyperplasia. Electronically Signed   By: Ilona Sorrel M.D.   On: 08/17/2015 16:22    Daleen Bo, MD 08/17/15 [phone]

## 2015-08-28 ENCOUNTER — Ambulatory Visit: Payer: Medicaid Other | Admitting: Internal Medicine

## 2015-09-05 ENCOUNTER — Ambulatory Visit: Payer: Medicaid Other | Admitting: Internal Medicine

## 2015-09-28 IMAGING — CR DG CHEST 2V
2 series · 2 of 2 positions shown · non-contrast
Comparison: April 22, 2013.

CLINICAL DATA: Cough and shortness of breath; chest pain

EXAM:
CHEST  2 VIEW

[w chest pa]
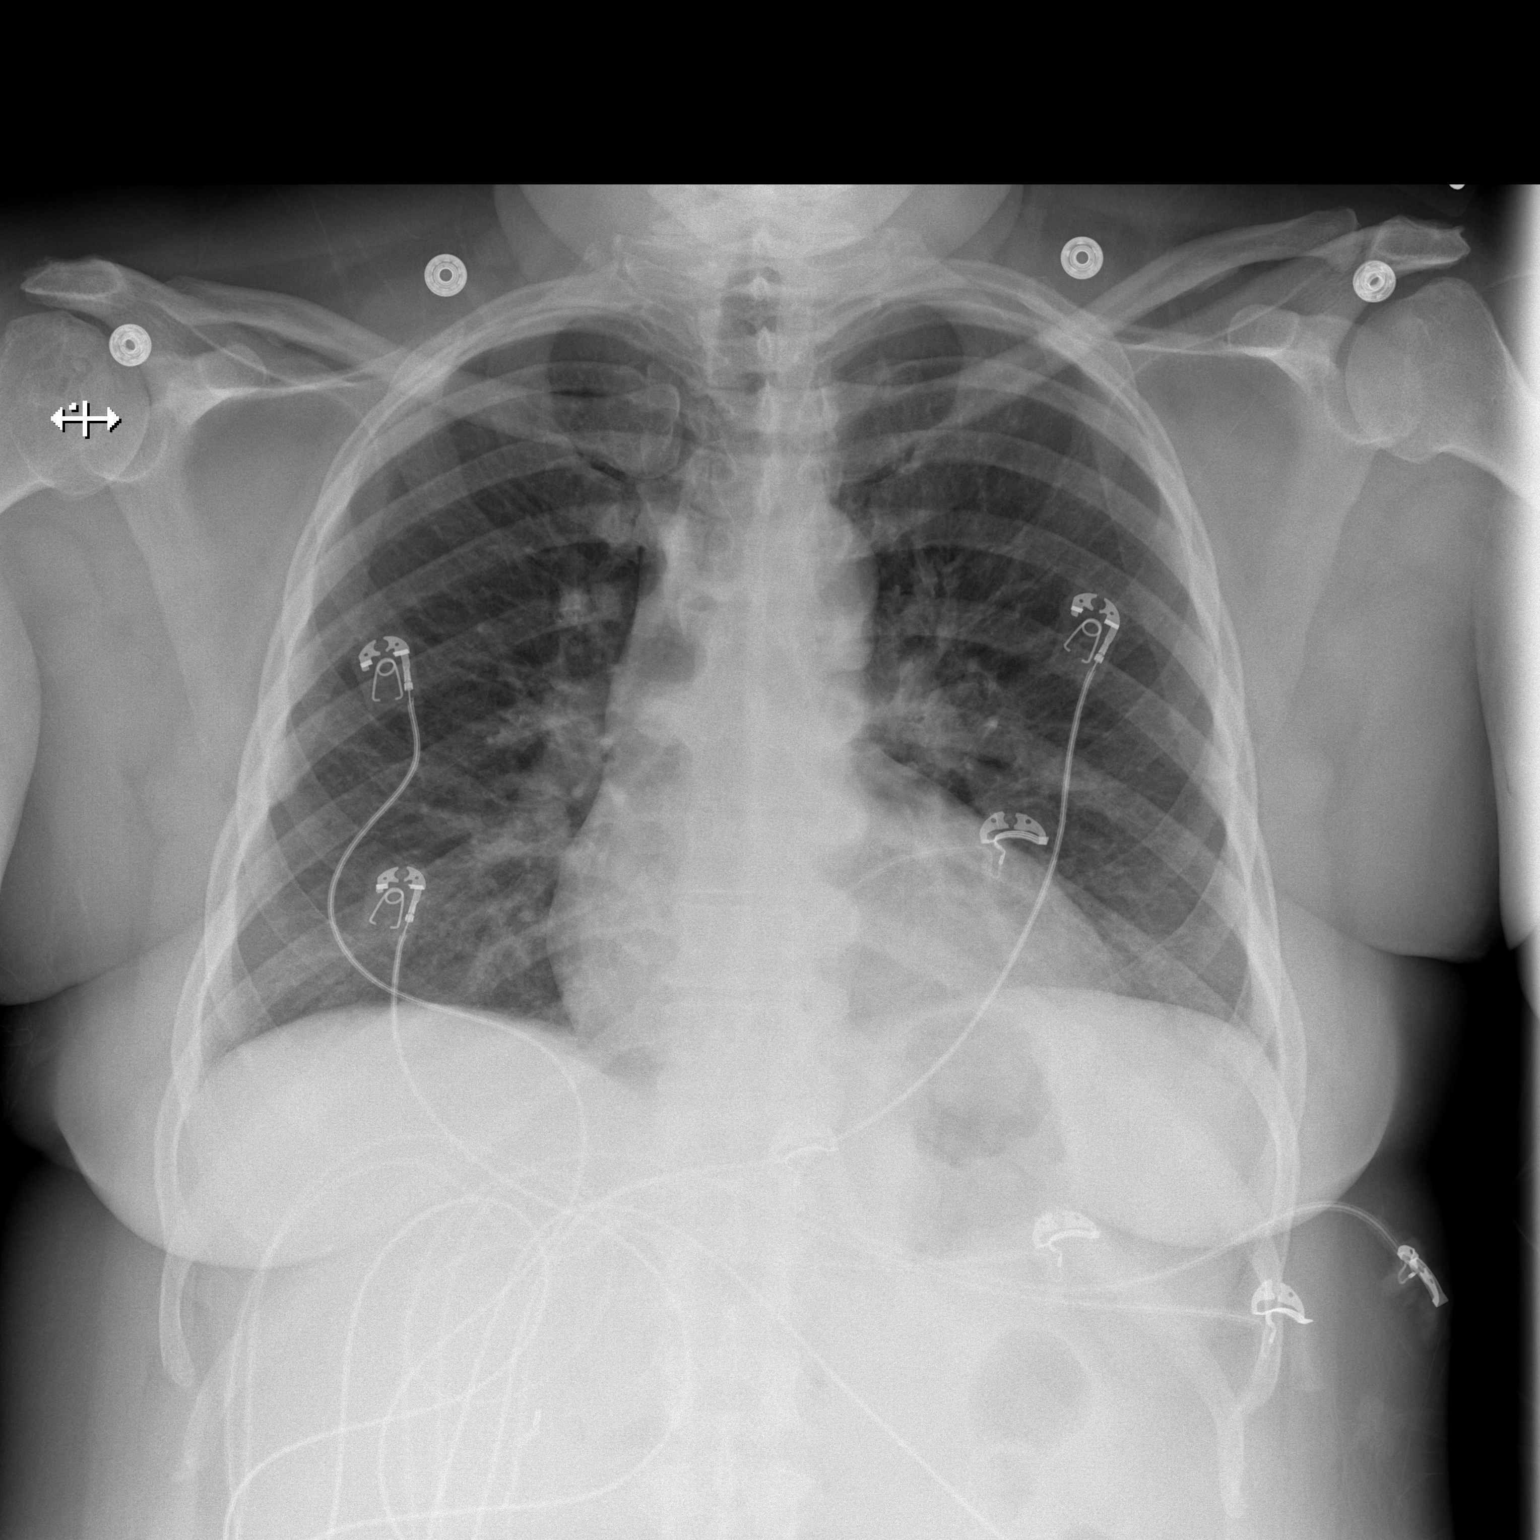

[w chest lat]
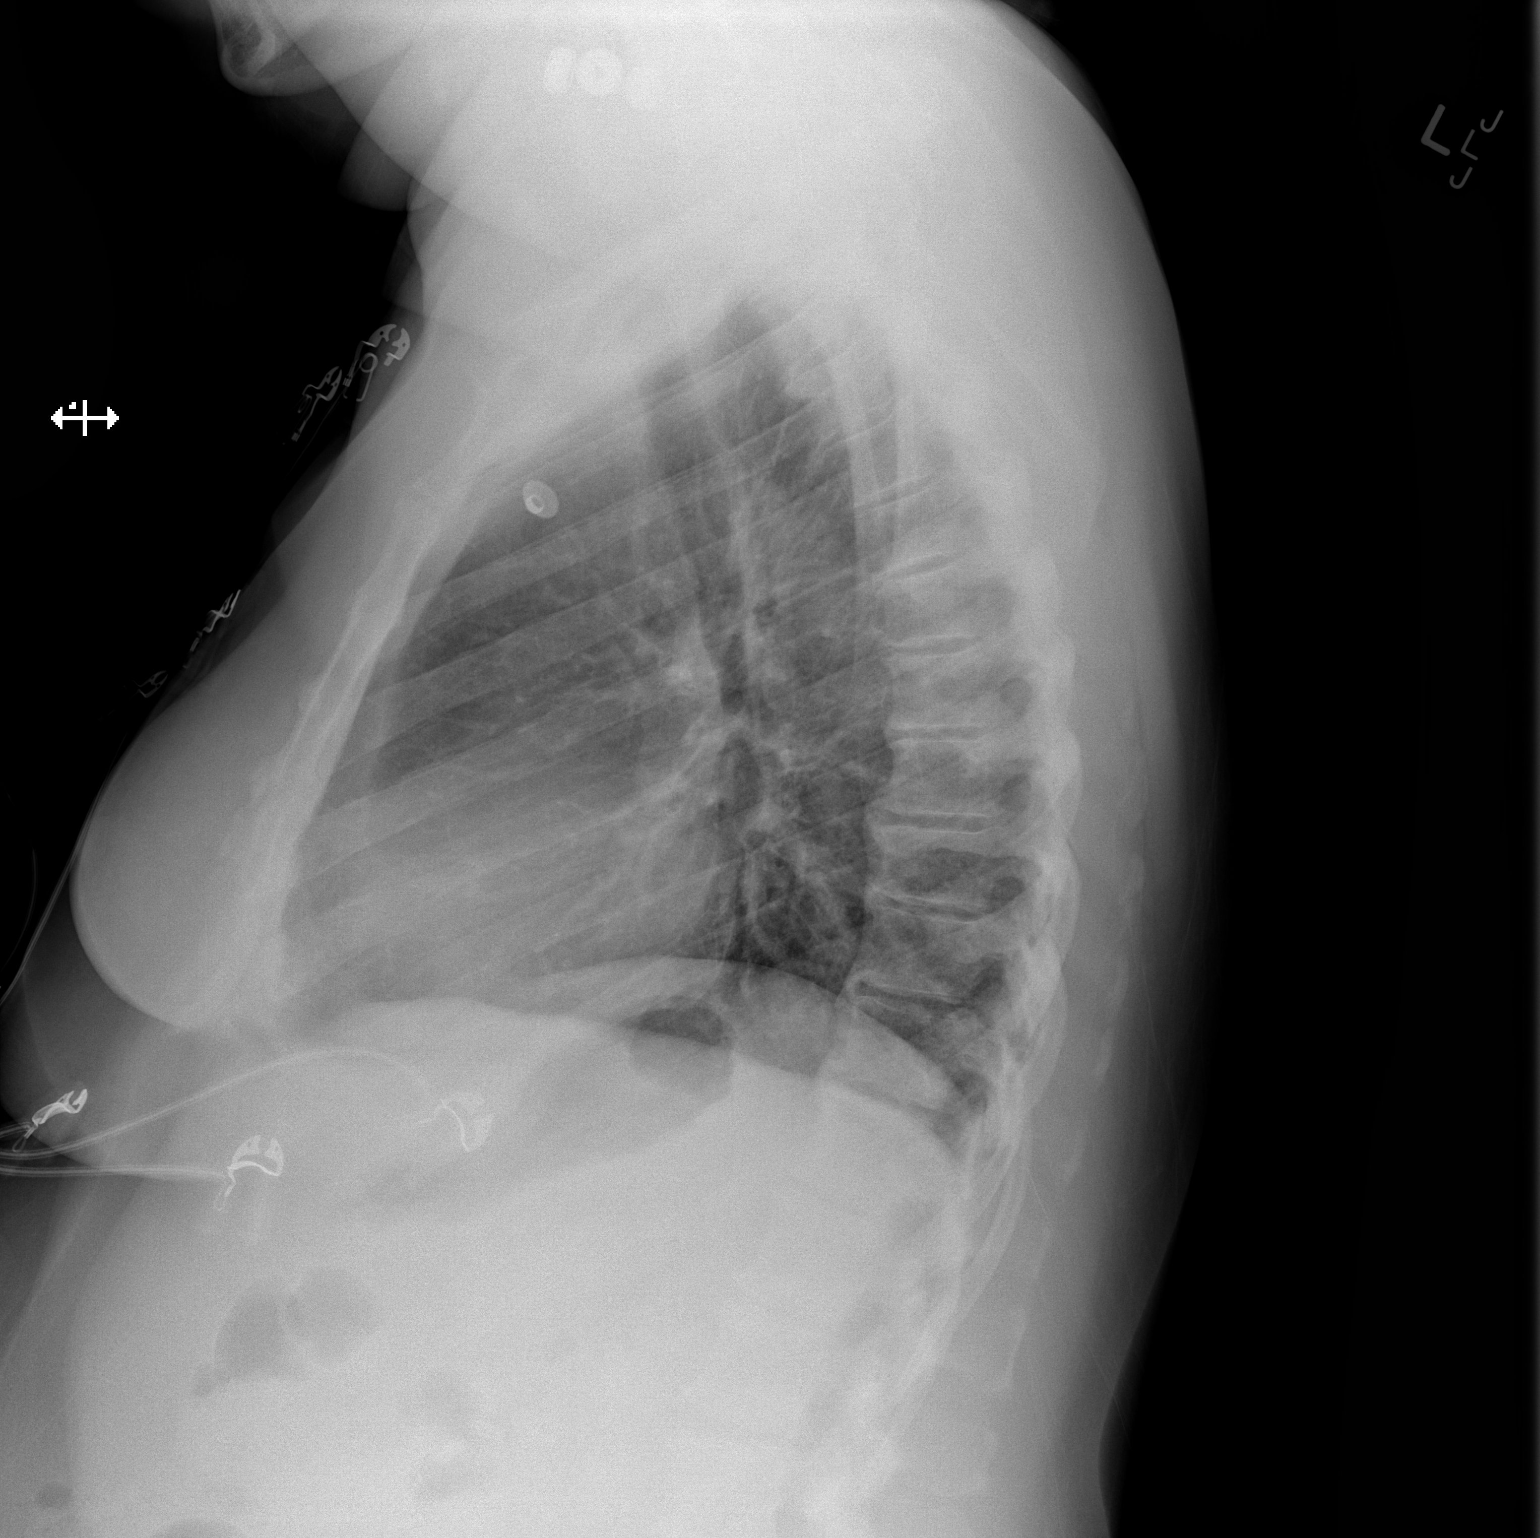

[2 of 2 positions shown; findings below may reference images not displayed]

FINDINGS: Lungs are clear. Heart size and pulmonary vascularity are normal. No
adenopathy. There is degenerative change in the thoracic spine. .
IMPRESSION: No edema or consolidation.

## 2015-10-03 ENCOUNTER — Ambulatory Visit: Payer: Medicaid Other | Admitting: Internal Medicine

## 2015-10-09 ENCOUNTER — Ambulatory Visit: Payer: Medicaid Other | Admitting: Internal Medicine

## 2015-10-18 ENCOUNTER — Ambulatory Visit: Payer: Medicaid Other | Admitting: Internal Medicine

## 2015-10-23 ENCOUNTER — Ambulatory Visit: Payer: Medicaid Other | Admitting: Internal Medicine

## 2015-10-30 ENCOUNTER — Telehealth: Payer: Self-pay

## 2015-10-30 ENCOUNTER — Telehealth: Payer: Self-pay | Admitting: Internal Medicine

## 2015-10-30 NOTE — Telephone Encounter (Signed)
Pt. Has concerns regarding kidneys...she would like to know if she can be admitted to the hospital to run all the test necessary. Please follow up

## 2015-10-30 NOTE — Telephone Encounter (Signed)
Returned call to patient  Patient states she is having kidney pain SOB is getting worse Patient was wanting to be admitted to have tests run in the hospital i explained to patient she would need to schedule and appointment with her provider To be evaluated and than from there her dr can order the necessary tests based  On her symptoms If symptoms get worse go to the ED to be evaluated

## 2015-11-07 ENCOUNTER — Telehealth: Payer: Self-pay | Admitting: Internal Medicine

## 2015-11-07 ENCOUNTER — Other Ambulatory Visit: Payer: Self-pay

## 2015-11-07 ENCOUNTER — Ambulatory Visit: Payer: Medicaid Other | Attending: Internal Medicine | Admitting: Internal Medicine

## 2015-11-07 ENCOUNTER — Encounter: Payer: Self-pay | Admitting: Internal Medicine

## 2015-11-07 VITALS — BP 138/88 | HR 84 | Temp 98.3°F | Resp 16 | Ht 62.0 in | Wt 213.8 lb

## 2015-11-07 DIAGNOSIS — Z87891 Personal history of nicotine dependence: Secondary | ICD-10-CM | POA: Insufficient documentation

## 2015-11-07 DIAGNOSIS — K219 Gastro-esophageal reflux disease without esophagitis: Secondary | ICD-10-CM | POA: Diagnosis not present

## 2015-11-07 DIAGNOSIS — F319 Bipolar disorder, unspecified: Secondary | ICD-10-CM | POA: Diagnosis not present

## 2015-11-07 DIAGNOSIS — M1990 Unspecified osteoarthritis, unspecified site: Secondary | ICD-10-CM | POA: Diagnosis not present

## 2015-11-07 DIAGNOSIS — J45909 Unspecified asthma, uncomplicated: Secondary | ICD-10-CM | POA: Diagnosis not present

## 2015-11-07 DIAGNOSIS — Z79899 Other long term (current) drug therapy: Secondary | ICD-10-CM | POA: Insufficient documentation

## 2015-11-07 DIAGNOSIS — R079 Chest pain, unspecified: Secondary | ICD-10-CM

## 2015-11-07 DIAGNOSIS — E278 Other specified disorders of adrenal gland: Secondary | ICD-10-CM | POA: Diagnosis not present

## 2015-11-07 DIAGNOSIS — I509 Heart failure, unspecified: Secondary | ICD-10-CM | POA: Insufficient documentation

## 2015-11-07 DIAGNOSIS — J4520 Mild intermittent asthma, uncomplicated: Secondary | ICD-10-CM

## 2015-11-07 DIAGNOSIS — I10 Essential (primary) hypertension: Secondary | ICD-10-CM | POA: Diagnosis not present

## 2015-11-07 DIAGNOSIS — Z888 Allergy status to other drugs, medicaments and biological substances status: Secondary | ICD-10-CM | POA: Insufficient documentation

## 2015-11-07 DIAGNOSIS — Z0000 Encounter for general adult medical examination without abnormal findings: Secondary | ICD-10-CM

## 2015-11-07 DIAGNOSIS — F209 Schizophrenia, unspecified: Secondary | ICD-10-CM | POA: Insufficient documentation

## 2015-11-07 DIAGNOSIS — L309 Dermatitis, unspecified: Secondary | ICD-10-CM

## 2015-11-07 DIAGNOSIS — R0602 Shortness of breath: Secondary | ICD-10-CM | POA: Insufficient documentation

## 2015-11-07 MED ORDER — ALBUTEROL SULFATE HFA 108 (90 BASE) MCG/ACT IN AERS: INHALATION_SPRAY | RESPIRATORY_TRACT | Status: DC

## 2015-11-07 MED ORDER — ALBUTEROL SULFATE 108 (90 BASE) MCG/ACT IN AEPB: 2.0000 | INHALATION_SPRAY | RESPIRATORY_TRACT | Status: DC | PRN

## 2015-11-07 MED ORDER — TRIAMCINOLONE ACETONIDE 0.1 % EX CREA: 1.0000 "application " | TOPICAL_CREAM | Freq: Two times a day (BID) | CUTANEOUS | Status: DC | PRN

## 2015-11-07 NOTE — Patient Instructions (Signed)
Make follow up appointment with Cardiology soon for re-evaluation.  I have seen the kidney spot you are talking about. It has been present in the past and it is stable. We only worry about it if you begin to have changes in certain blood test

## 2015-11-07 NOTE — Progress Notes (Signed)
Patient ID: Lori Bean, female   DOB: 1970/12/02, 45 y.o.   MRN: [id number]  CC: SOB  HPI: Lori Bean is a 45 y.o. female here today for a follow up visit.  Patient has past medical history of CHF, murmur, bipolar disorder, HTN, and asthma. Patient reports that for the past 1 month she has been having symptoms of SOB and intermittent chest pain. Chest pain is with exertion and rest that is sharp and stabbing. SOB is with talking, walking, or any exertion. She states that she has 3 pillow orthopnea, if she lays flat she feels like she is "drowning". Some wheezing but she is out of her inhalers. She still takes her Bumex once daily but reports that she does not have a good urinary response. Review of chart reveals that she had normal stress test with troponin two months ago when she experienced similar symptoms 2 months ago. No chest pain currently.  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   Allergies  Allergen Reactions  . Aspirin Nausea And Vomiting    'It makes me throw up."   Past Medical History  Diagnosis Date  . Asthma   . Hypertension   . Anxiety   . Snoring disorder     Pt stated my boyfriend always wakes me up and tells me to breathe  . Heart murmur   . CHF (congestive heart failure) ([place])   . Shortness of breath   . Bronchitis     hx of  . Bipolar affective disorder ([place])     Schizophrenia  . Depression   . GERD (gastroesophageal reflux disease)   . Arthritis   . Overactive bladder   . Schizophrenia ([place])   . Sciatica   . Anginal pain ([place])     occ from asthma   Current Outpatient Prescriptions on File Prior to Visit  Medication Sig Dispense Refill  . Albuterol Sulfate (PROAIR RESPICLICK) 123XX123 (90 BASE) MCG/ACT AEPB Inhale 2 puffs into the lungs every 4 (four) hours as needed (Every 4-6 hours as needed for wheezing).  1 each 3  . amLODipine (NORVASC) 10 MG tablet TAKE ONE (1) TABLET BY MOUTH EVERY DAY 30 tablet 5  . ARIPiprazole (ABILIFY) 20 MG tablet Take 1 tablet (20 mg total) by mouth at bedtime. 30 tablet 0  . bumetanide (BUMEX) 2 MG tablet Take 1 tablet (2 mg total) by mouth daily. 60 tablet 11  . chlorhexidine (PERIDEX) 0.12 % solution Use as directed 15 mLs in the mouth or throat 3 (three) times daily. 120 mL 1  . gabapentin (NEURONTIN) 300 MG capsule TAKE ONE (1) CAPSULE BY MOUTH 2 TIMES DAILY 60 capsule 0  . HYDROcodone-acetaminophen (NORCO/VICODIN) 5-325 MG tablet Take 1 tablet by mouth every 6 (six) hours as needed. (Patient taking differently: Take 1 tablet by mouth every 6 (six) hours as needed for moderate pain. ) 10 tablet 0  . ibuprofen (ADVIL,MOTRIN) 800 MG tablet Take 1 tablet (800 mg total) by mouth 3 (three) times daily. 30 tablet 0  . loperamide (IMODIUM A-D) 2 MG tablet Take 1 tablet (2 mg total) by mouth 3 (three) times daily as needed for diarrhea or loose stools. 30 tablet 0  . omeprazole (PRILOSEC) 20 MG capsule Take 1 capsule (20 mg total) by mouth daily. 30 capsule 6  . oxyCODONE-acetaminophen (PERCOCET) 10-325 MG per  tablet Take 1-2 tablets by mouth every 4 (four) hours as needed for pain. (Patient taking differently: Take 1 tablet by mouth every 4 (four) hours as needed for pain. ) 90 tablet 0  . potassium chloride 20 MEQ TBCR Take 20 mEq by mouth 2 (two) times daily. 60 tablet 2  . PROAIR HFA 108 (90 BASE) MCG/ACT inhaler INHALE TWO PUFFS INTO THE LUNGS EVERY SIX HOURS AS NEEDED FOR WHEEZING OR SHORTNESS OF BREATH 8.5 g 3  . triamcinolone cream (KENALOG) 0.1 % Apply 1 application topically 2 (two) times daily as needed (eczema). Do not apply to face 30 g 2  . [DISCONTINUED] solifenacin (VESICARE) 5 MG tablet Take 5 mg by mouth daily.     Current Facility-Administered Medications on File Prior to Visit  Medication Dose Route Frequency Provider Last Rate Last Dose  . nitroGLYCERIN  (NITROSTAT) SL tablet 0.4 mg  0.4 mg Sublingual Q5 min PRN [name], MD   0.4 mg at 05/24/15 0850   Family History  Problem Relation Age of Onset  . Diabetes Mother   . Diabetes Father   . Heart disease Paternal Aunt   . Cancer Paternal Aunt    Social History   Social History  . Marital Status: Divorced    Spouse Name: N/A  . Number of Children: N/A  . Years of Education: N/A   Occupational History  . Not on file.   Social History Main Topics  . Smoking status: Former Smoker -- 0.25 packs/day for 9 years    Types: Cigarettes    Quit date: 05/23/2014  . Smokeless tobacco: Never Used  . Alcohol Use: No     Comment: quit Nov. 2014  . Drug Use: No  . Sexual Activity: Not Currently   Other Topics Concern  . Not on file   Social History Narrative   Review of Systems  Respiratory: Positive for shortness of breath and wheezing. Negative for cough.   Cardiovascular: Positive for chest pain, orthopnea and leg swelling. Negative for palpitations and claudication.  Skin: Positive for itching and rash.  All other systems reviewed and are negative.  Objective:   Filed Vitals:   11/07/15 1041  BP: 138/88  Pulse: 84  Temp: 98.3 F (36.8 C)  Resp: 16    Physical Exam  Constitutional: She is oriented to person, place, and time.  Neck: No JVD present.  Cardiovascular: Normal rate, regular rhythm and normal heart sounds.   Pulmonary/Chest: Effort normal and breath sounds normal. She has no wheezes. She exhibits no tenderness.  Musculoskeletal: She exhibits no edema.  Neurological: She is alert and oriented to person, place, and time.  Skin:  Right foot dermatitis  Psychiatric: She has a normal mood and affect.     Lab Results  Component Value Date   WBC 7.7 08/17/2015   HGB 12.1 08/17/2015   HCT 36.5 08/17/2015   MCV 89.9 08/17/2015   PLT 268 08/17/2015   Lab Results  Component Value Date   CREATININE 0.82 08/17/2015   BUN 7 08/17/2015   NA 139  08/17/2015   K 3.4* 08/17/2015   CL 105 08/17/2015   CO2 27 08/17/2015    Lab Results  Component Value Date   HGBA1C 6.1* 03/04/2015   Lipid Panel     Component Value Date/Time   CHOL 133 05/25/2015 0333   TRIG 105 05/25/2015 0333   HDL 50 05/25/2015 0333   CHOLHDL 2.7 05/25/2015 0333   VLDL 21 05/25/2015 0333  Nettie 62 05/25/2015 0333       Assessment and plan:   Lori Bean was seen today for shortness of breath.  Diagnoses and all orders for this visit:  Chest pain, unspecified chest pain type -     EKG 12-Lead No new changes to EKG. I have advised patient to follow up with her cardiologist. No clear etiology can be determined today but she is stable for discharge. I believe she is low event risk due to normal stress test and troponin's recently.   SOB (shortness of breath) -     DME Nebulizer machine Review of charts patient had negative CT of chest that was unrevealing for clots. She is not tachycardic and appears in no distress today. She is able to speak in full sentences and lungs are clear. Low risk of clots. I do not see any signs of fluid overload today and her weight is down. I do not feel this is a CHF exacerbation.    Dermatitis -     triamcinolone cream (KENALOG) 0.1 %; Apply 1 application topically 2 (two) times daily as needed (foot rash).  Asthma, mild intermittent, uncomplicated -   Refill Albuterol Sulfate (PROAIR RESPICLICK) 123XX123 (90 Base) MCG/ACT AEPB; Inhale 2 puffs into the lungs every 4 (four) hours as needed (Every 4-6 hours as needed for wheezing).  Return in about 3 months (around 02/04/2016).      Lance Bosch, [facility] [phone] 11/07/2015, 11:15 AM

## 2015-11-07 NOTE — Progress Notes (Signed)
Patient complains of having some SOB and intermittent chest pain Also has a rash to the top of her right foot and concerned about a spot on  Her kidney that was found in an [place]

## 2015-11-07 NOTE — Telephone Encounter (Signed)
Pt would like to have her referrals for [facility] and [facility] on [address]. As both of these referrals have expired. Please advise. Sadie Reynolds, ASA

## 2015-11-11 ENCOUNTER — Telehealth: Payer: Self-pay

## 2015-11-11 NOTE — Telephone Encounter (Signed)
-----   Message from Lance Bosch, NP sent at 11/10/2015  4:18 PM EST ----- Regarding: FW: Gi Referral  Langley Gauss, I cannot remember why I placed a GI referral for this lady. Apparently I did not chart on it. I think she said she still having stomach pains but can you call and find out so I can make a addendum to the chart. They are ready to schedule her but I never put why she needed the referral.  Alinda Sierras, sorry. Guess I was moving so fast that I forgot to chart on the reason and now I do not remember. When [place] calls her I will let you know.   ----- Message -----    From: Ena Dawley    Sent: 11/08/2015   5:33 PM      To: Lance Bosch, NP Subject: Gi Referral                                    Hi  Val  I hope you had or having a good day and weekend  Amber from [facility] call me to ask me about Mrs Marconi Referral they don't know why is the reason or the dx for her to see the specialist  Thanks .

## 2015-11-11 NOTE — Telephone Encounter (Signed)
Thank You  I call [facility] and they are aware of that

## 2015-11-11 NOTE — Telephone Encounter (Signed)
Spoke with patient this afternoon Patient is seeing the GI for her continuous loose stools

## 2015-11-15 MED FILL — PROAIR HFA 90 MCG INHALER: 108 (90 BAS | 30 days supply | Qty: 9 | Fill #0

## 2015-12-17 IMAGING — CR DG CHEST 2V
2 series · 2 of 2 positions shown · non-contrast
Comparison: DG CHEST 2 VIEW dated 08/24/2013;

CLINICAL DATA: Severe right lower leg pain with numbness and
weakness, chest pain and shortness of breath

EXAM:
CHEST  2 VIEW

[w chest pa *]
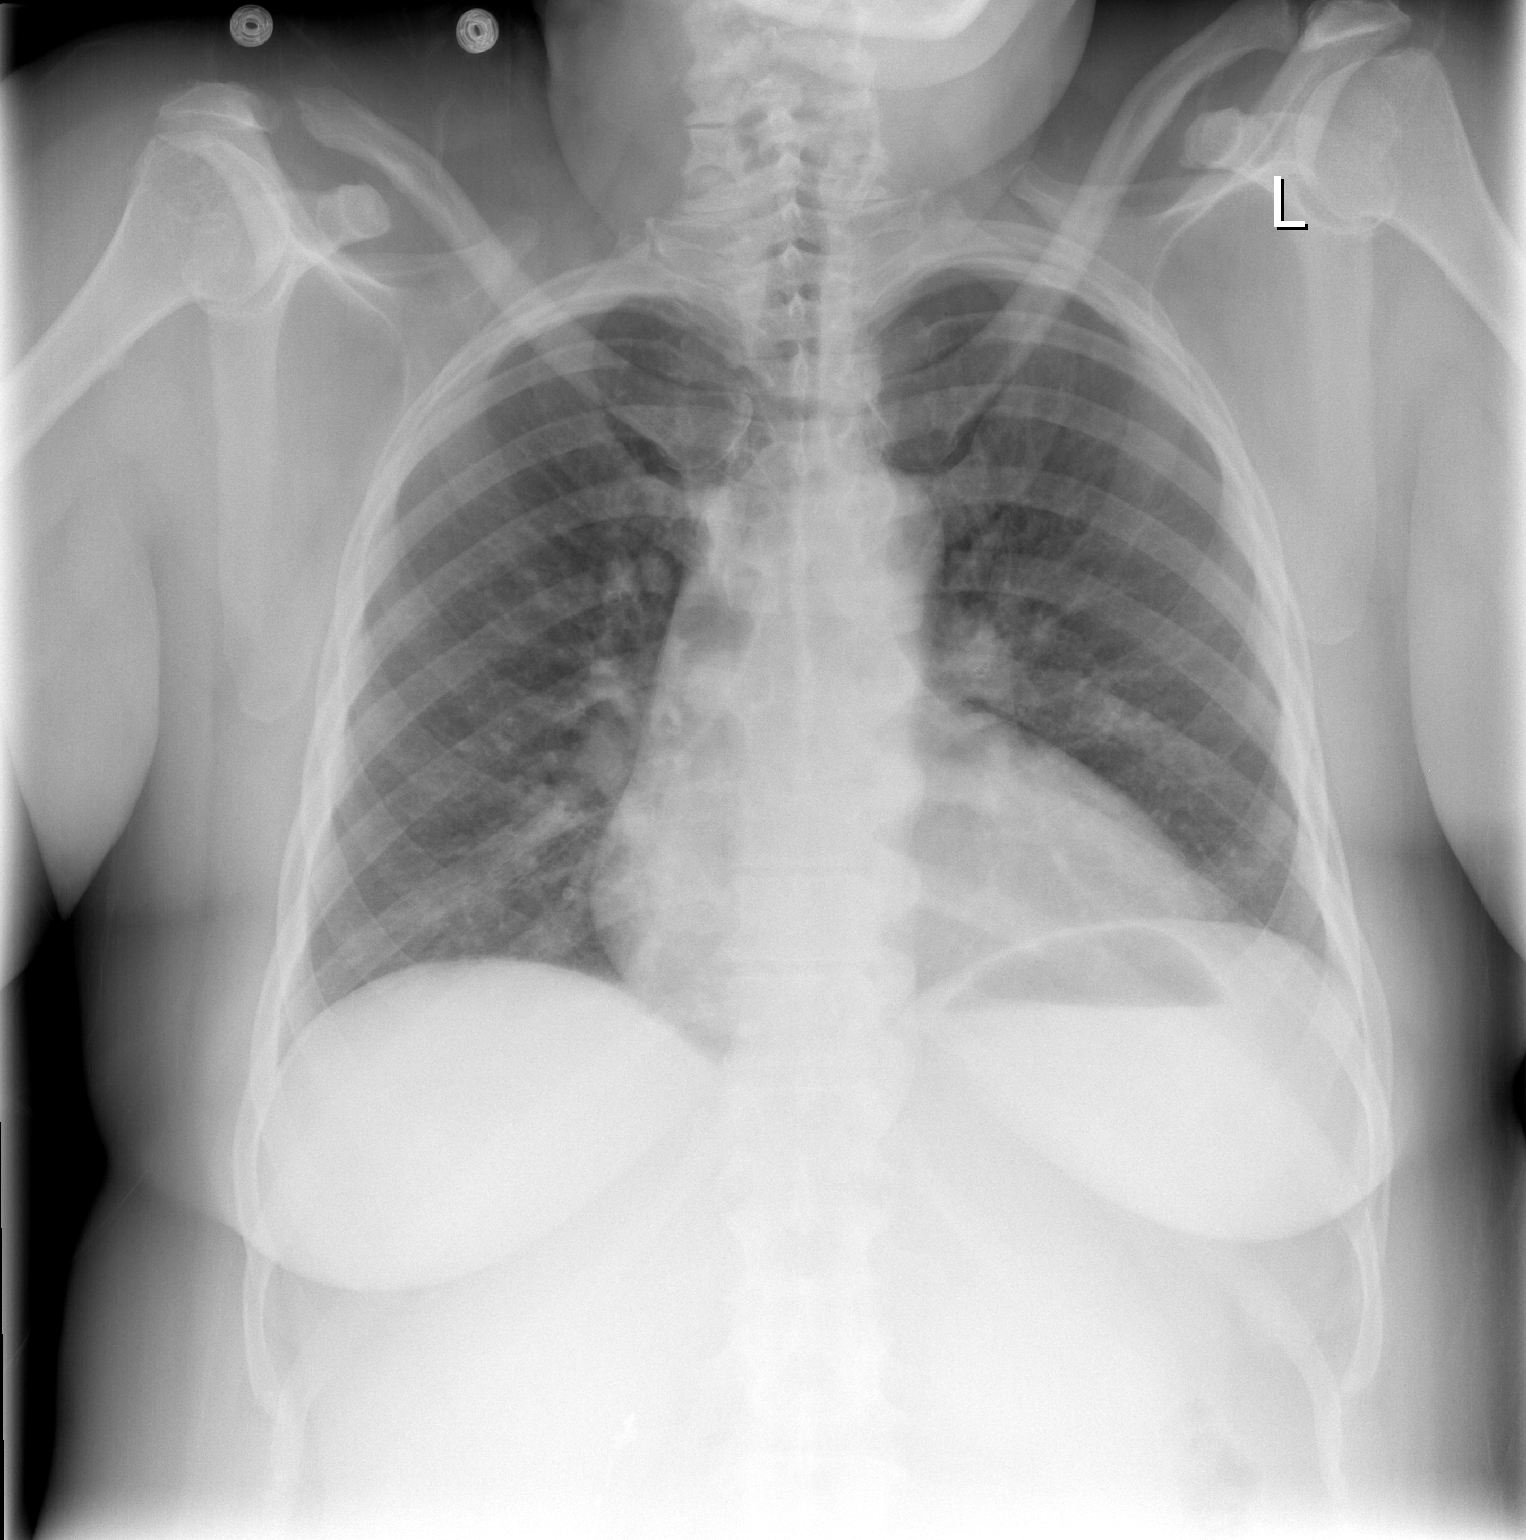

[w chest lat *]
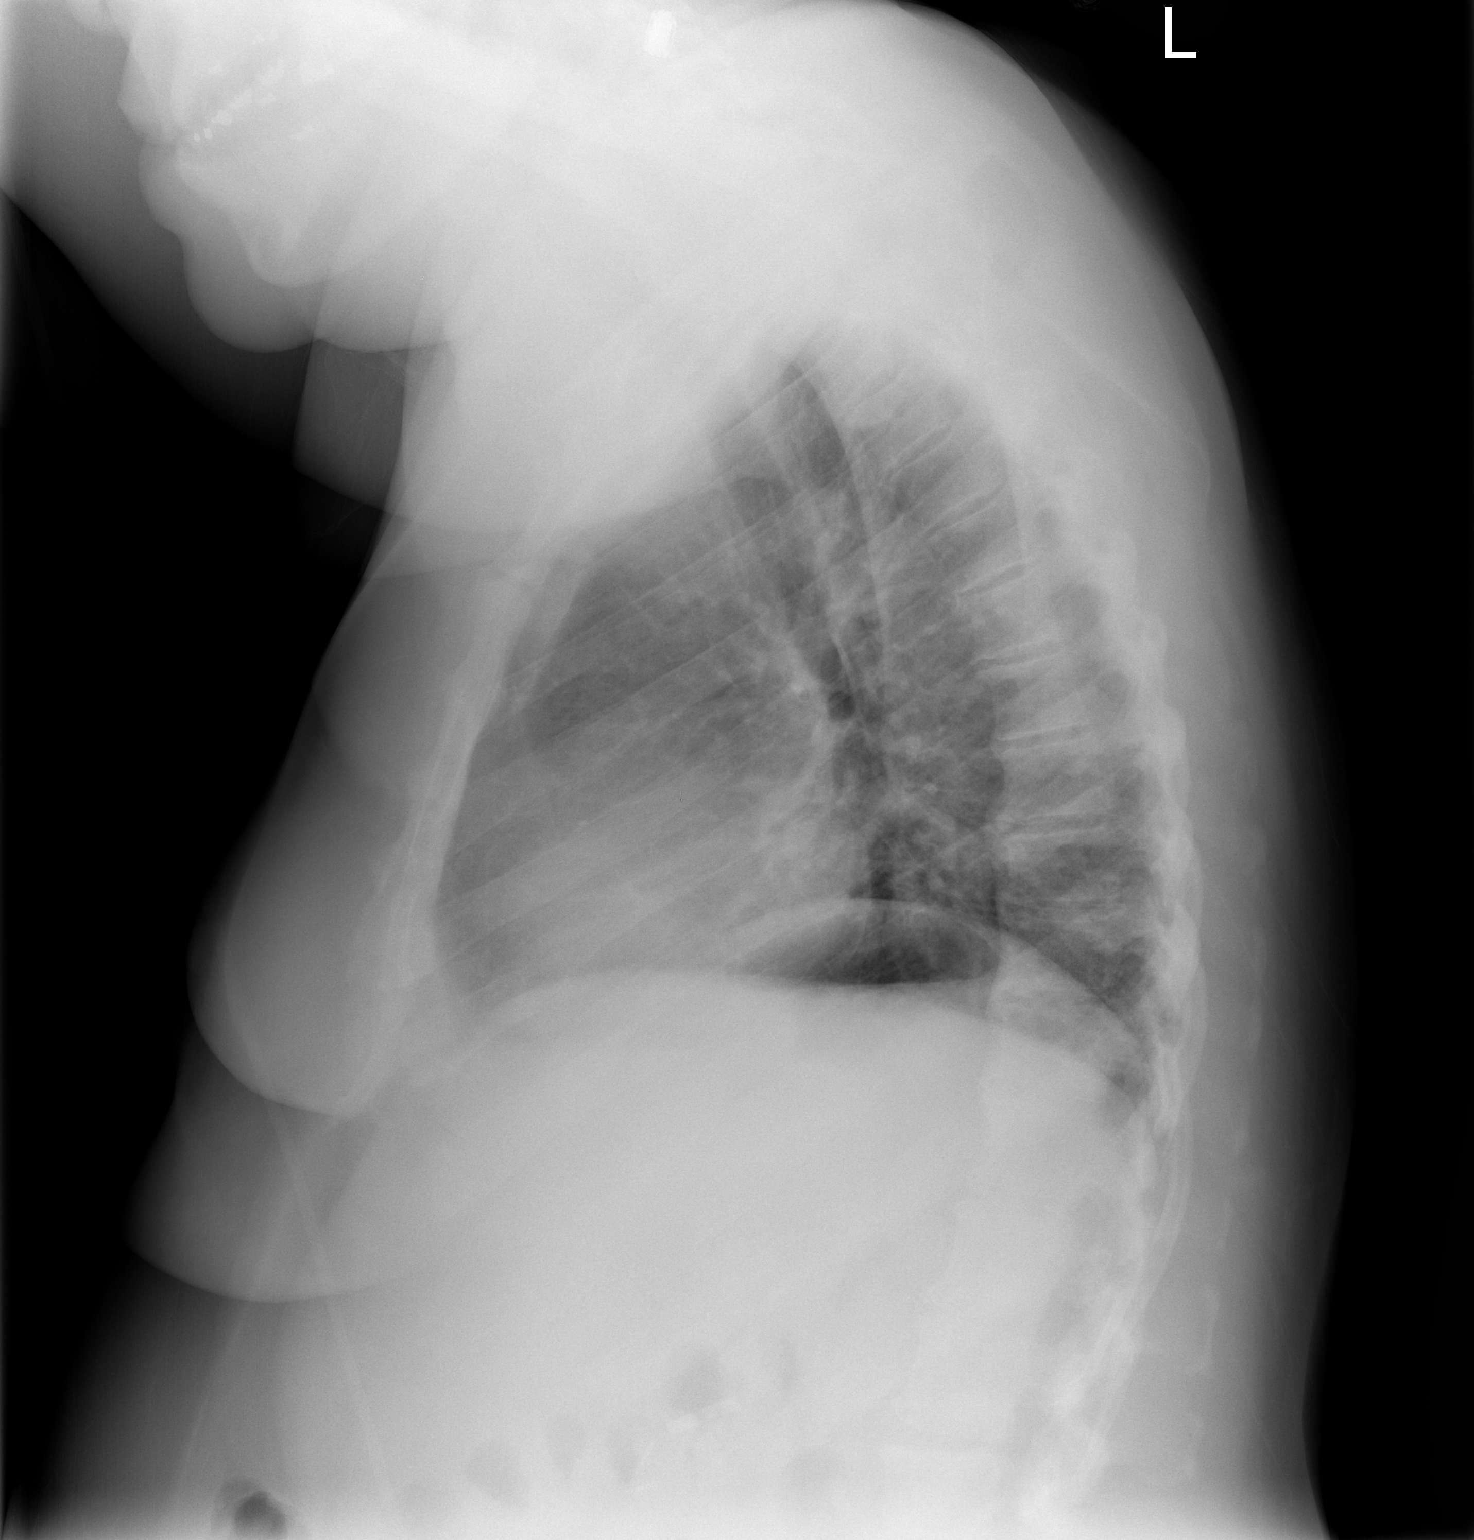

[2 of 2 positions shown; findings below may reference images not displayed]

DG CHEST 2 VIEW dated
04/22/2013; DG CHEST 2 VIEW dated 05/08/2012; CT ANGIO CHEST W/CM
&/OR WO/CM dated 05/08/2012
FINDINGS: Grossly unchanged borderline enlarged cardiac silhouette. Unchanged
left basilar such retrocardiac opacities favored to represent
atelectasis or scar. No new focal airspace opacities. No pleural
effusion or pneumothorax. No definite evidence of edema. No acute
osseus abnormalities. Post cholecystectomy.
IMPRESSION: Left basilar atelectasis without acute cardiopulmonary disease.

## 2015-12-23 ENCOUNTER — Encounter (HOSPITAL_COMMUNITY): Payer: Self-pay | Admitting: Emergency Medicine

## 2015-12-23 ENCOUNTER — Emergency Department (HOSPITAL_COMMUNITY): Payer: Medicaid Other

## 2015-12-23 ENCOUNTER — Emergency Department (HOSPITAL_COMMUNITY)
Admission: EM | Admit: 2015-12-23 | Discharge: 2015-12-23 | Disposition: A | Discharge status: Home / Self Care | Payer: Medicaid Other | Attending: Emergency Medicine | Admitting: Emergency Medicine

## 2015-12-23 DIAGNOSIS — I10 Essential (primary) hypertension: Secondary | ICD-10-CM | POA: Diagnosis not present

## 2015-12-23 DIAGNOSIS — G43009 Migraine without aura, not intractable, without status migrainosus: Secondary | ICD-10-CM | POA: Insufficient documentation

## 2015-12-23 DIAGNOSIS — Z79899 Other long term (current) drug therapy: Secondary | ICD-10-CM | POA: Diagnosis not present

## 2015-12-23 DIAGNOSIS — F319 Bipolar disorder, unspecified: Secondary | ICD-10-CM | POA: Diagnosis not present

## 2015-12-23 DIAGNOSIS — R011 Cardiac murmur, unspecified: Secondary | ICD-10-CM | POA: Diagnosis not present

## 2015-12-23 DIAGNOSIS — F419 Anxiety disorder, unspecified: Secondary | ICD-10-CM | POA: Diagnosis not present

## 2015-12-23 DIAGNOSIS — F209 Schizophrenia, unspecified: Secondary | ICD-10-CM | POA: Diagnosis not present

## 2015-12-23 DIAGNOSIS — R079 Chest pain, unspecified: Secondary | ICD-10-CM | POA: Diagnosis present

## 2015-12-23 DIAGNOSIS — Z87891 Personal history of nicotine dependence: Secondary | ICD-10-CM | POA: Diagnosis not present

## 2015-12-23 DIAGNOSIS — M542 Cervicalgia: Secondary | ICD-10-CM | POA: Diagnosis not present

## 2015-12-23 DIAGNOSIS — I209 Angina pectoris, unspecified: Secondary | ICD-10-CM | POA: Diagnosis not present

## 2015-12-23 DIAGNOSIS — J45909 Unspecified asthma, uncomplicated: Secondary | ICD-10-CM | POA: Diagnosis not present

## 2015-12-23 DIAGNOSIS — R0789 Other chest pain: Secondary | ICD-10-CM | POA: Insufficient documentation

## 2015-12-23 LAB — BASIC METABOLIC PANEL
Anion gap: 7 (ref 5–15)
BUN: 18 mg/dL (ref 6–20)
CO2: 28 mmol/L (ref 22–32)
Calcium: 9.8 mg/dL (ref 8.9–10.3)
Chloride: 105 mmol/L (ref 101–111)
Creatinine, Ser: 0.81 mg/dL (ref 0.44–1.00)
GFR calc Af Amer: >60 mL/min (ref >60)
GFR calc non Af Amer: >60 mL/min (ref >60)
Glucose, Bld: 102 mg/dL — ABNORMAL HIGH (ref 65–99)
Potassium: 4.8 mmol/L (ref 3.5–5.1)
Sodium: 140 mmol/L (ref 135–145)

## 2015-12-23 LAB — CBC
HCT: 40.3 % (ref 36.0–46.0)
Hemoglobin: 13 g/dL (ref 12.0–15.0)
MCH: 28.8 pg (ref 26.0–34.0)
MCHC: 32.3 g/dL (ref 30.0–36.0)
MCV: 89.4 fL (ref 78.0–100.0)
Platelets: 312 10*3/uL (ref 150–400)
RBC: 4.51 MIL/uL (ref 3.87–5.11)
RDW: 14.1 % (ref 11.5–15.5)
WBC: 8.9 10*3/uL (ref 4.0–10.5)

## 2015-12-23 LAB — I-STAT TROPONIN, ED
Troponin i, poc: 0 ng/mL (ref 0.00–0.08)
Troponin i, poc: 0 ng/mL (ref 0.00–0.08)

## 2015-12-23 LAB — BRAIN NATRIURETIC PEPTIDE: B Natriuretic Peptide: 7.8 pg/mL (ref 0.0–100.0)

## 2015-12-23 MED ORDER — PROCHLORPERAZINE EDISYLATE 5 MG/ML IJ SOLN
10.0000 mg | Freq: Once | INTRAMUSCULAR | Status: AC
  Administered 2015-12-23: 10 mg via INTRAVENOUS
  Filled 2015-12-23: qty 2

## 2015-12-23 MED ORDER — METHOCARBAMOL 500 MG PO TABS: 500.0000 mg | ORAL_TABLET | Freq: Two times a day (BID) | ORAL | Status: DC

## 2015-12-23 MED ORDER — SODIUM CHLORIDE 0.9 % IV BOLUS (SEPSIS)
500.0000 mL | Freq: Once | INTRAVENOUS | Status: AC
  Administered 2015-12-23: 500 mL via INTRAVENOUS

## 2015-12-23 MED ORDER — KETOROLAC TROMETHAMINE 15 MG/ML IJ SOLN
15.0000 mg | Freq: Once | INTRAMUSCULAR | Status: AC
  Administered 2015-12-23: 15 mg via INTRAVENOUS
  Filled 2015-12-23: qty 1

## 2015-12-23 MED ORDER — DIPHENHYDRAMINE HCL 50 MG/ML IJ SOLN
25.0000 mg | Freq: Once | INTRAMUSCULAR | Status: AC
  Administered 2015-12-23: 25 mg via INTRAVENOUS
  Filled 2015-12-23: qty 1

## 2015-12-23 MED ORDER — IBUPROFEN 600 MG PO TABS: 600.0000 mg | ORAL_TABLET | Freq: Three times a day (TID) | ORAL | Status: DC | PRN

## 2015-12-23 NOTE — ED Notes (Signed)
Pt states she started having chest pain today around 11am. Pt reports the pain comes and goes. Pt also c/o dizziness and SOB

## 2015-12-23 NOTE — ED Provider Notes (Signed)
CSN: [id number]     Arrival date & time 12/23/15  1601 History   First MD Initiated Contact with Patient 12/23/15 1818     Chief Complaint  Patient presents with  . Chest Pain     (Consider location/radiation/quality/duration/timing/severity/associated sxs/prior Treatment) HPI 45 y.o. 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 She states that her left-sided headache is associated with photophobia and radiation of her pain down her left paraspinal cervical musculature she states that she has tried taking ibuprofen to no avail of her symptoms she states that as her symptoms have persisted today her pain has begun to radiate down into her anterior chest musculature she denies any radiation down her left arm and down her back any further. She notes associated scotomata flashing lights and floaters in her left eye since the onset of her symptoms. She denies any recent fever or chills,  denies any recent trauma, denies IV drug abuse. Denies any focal neurologic deficits, or difficulty walking. She describes her pain as 10 out of 10 and throbbing her neck pain and chest pain are significantly worse with movement and palpation.   Past Medical History  Diagnosis Date  . Asthma   . Hypertension   . Anxiety   . Snoring disorder     Pt stated my boyfriend always wakes me up and tells me to breathe  . Heart murmur   . CHF (congestive heart failure) ([place])   . Shortness of breath   . Bronchitis     hx of  . Bipolar affective disorder ([place])     Schizophrenia  . Depression   . GERD (gastroesophageal reflux disease)   . Arthritis   . Overactive bladder   . Schizophrenia ([place])   . Sciatica   . Anginal pain ([place])     occ from asthma   Past Surgical History  Procedure Laterality Date  . Rotator cuff repair      Right shoulder  . Tubal  ligation    . Gallstones reomved    . Lumbar laminectomy/decompression microdiscectomy  04/01/2012    Procedure: LUMBAR LAMINECTOMY/DECOMPRESSION MICRODISCECTOMY 2 LEVELS;  Surgeon: Faythe Ghee, MD;  Location: [facility];  Service: Neurosurgery;  Laterality: Left;  Lumbar four-five, lumbar five sacral one microdiscectomy   . Lumbar wound debridement  04/29/2012    Procedure: LUMBAR WOUND DEBRIDEMENT;  Surgeon: Faythe Ghee, MD;  Location: [facility];  Service: Neurosurgery;  Laterality: N/A;  lumbar wound debridement  . Cholecystectomy    . Eye surgery      Metal plate in right eye  . Back surgery      3 back surgeries   Family History  Problem Relation Age of Onset  . Diabetes Mother   . Diabetes Father   . Heart disease Paternal Aunt   . Cancer Paternal Aunt    Social History  Substance Use Topics  . Smoking status: Former Smoker -- 0.25 packs/day for 9 years    Types: Cigarettes    Quit date: 05/23/2014  . Smokeless tobacco: Never Used  . Alcohol Use: No     Comment: quit Nov. 2014   OB History    No data available     Review of Systems  Constitutional: Positive for activity change. Negative for fever and chills.  HENT: Negative for congestion, rhinorrhea, sinus pressure and sneezing.  Eyes: Positive for photophobia and visual disturbance. Negative for pain and redness.  Respiratory: Negative for cough, chest tightness and shortness of breath.   Cardiovascular: Positive for chest pain.  Gastrointestinal: Negative for nausea, vomiting, abdominal pain and diarrhea.  Genitourinary: Negative for dysuria and difficulty urinating.  Musculoskeletal: Positive for neck pain and neck stiffness. Negative for myalgias, back pain and arthralgias.  Skin: Negative for rash and wound.  Neurological: Positive for headaches. Negative for dizziness, seizures, syncope, speech difficulty, weakness, light-headedness and numbness.  All other systems reviewed and are  negative.     Allergies  Aspirin  Home Medications   Prior to Admission medications   Medication Sig Start Date End Date Taking? Authorizing Provider  albuterol (PROAIR HFA) 108 (90 Base) MCG/ACT inhaler INHALE TWO PUFFS INTO THE LUNGS EVERY SIX HOURS AS NEEDED FOR WHEEZING OR SHORTNESS OF BREATH 11/07/15  Yes Lance Bosch, NP  amLODipine (NORVASC) 10 MG tablet TAKE ONE (1) TABLET BY MOUTH EVERY DAY Patient taking differently: Take 10 mg by mouth daily.  03/04/15  Yes Lance Bosch, NP  ARIPiprazole (ABILIFY) 20 MG tablet Take 1 tablet (20 mg total) by mouth at bedtime. 03/04/15  Yes Lance Bosch, NP  bumetanide (BUMEX) 2 MG tablet Take 1 tablet (2 mg total) by mouth daily. 05/24/15  Yes [name], MD  chlorhexidine (PERIDEX) 0.12 % solution Use as directed 15 mLs in the mouth or throat 3 (three) times daily. Patient taking differently: Use as directed 15 mLs in the mouth or throat daily as needed (mouth pain).  07/26/15  Yes Billy Fischer, MD  gabapentin (NEURONTIN) 300 MG capsule TAKE ONE (1) CAPSULE BY MOUTH 2 TIMES DAILY 01/22/15  Yes Lance Bosch, NP  omeprazole (PRILOSEC) 20 MG capsule Take 1 capsule (20 mg total) by mouth daily. 03/04/15  Yes Lance Bosch, NP  oxyCODONE-acetaminophen (PERCOCET) 10-325 MG per tablet Take 1-2 tablets by mouth every 4 (four) hours as needed for pain. 01/02/14  Yes Earnie Larsson, MD  triamcinolone cream (KENALOG) 0.1 % Apply 1 application topically 2 (two) times daily as needed (foot rash). 11/07/15  Yes Lance Bosch, NP  Albuterol Sulfate (PROAIR RESPICLICK) 123XX123 (90 Base) MCG/ACT AEPB Inhale 2 puffs into the lungs every 4 (four) hours as needed (Every 4-6 hours as needed for wheezing). Patient not taking: Reported on 12/23/2015 11/07/15   Lance Bosch, NP  HYDROcodone-acetaminophen (NORCO/VICODIN) 5-325 MG tablet Take 1 tablet by mouth every 6 (six) hours as needed. Patient not taking: Reported on 12/23/2015 07/26/15   Billy Fischer, MD  ibuprofen  (ADVIL,MOTRIN) 600 MG tablet Take 1 tablet (600 mg total) by mouth every 8 (eight) hours as needed. 12/23/15   Zenovia Jarred, DO  loperamide (IMODIUM A-D) 2 MG tablet Take 1 tablet (2 mg total) by mouth 3 (three) times daily as needed for diarrhea or loose stools. Patient not taking: Reported on 12/23/2015 06/19/15   Lance Bosch, NP  methocarbamol (ROBAXIN) 500 MG tablet Take 1 tablet (500 mg total) by mouth 2 (two) times daily. 12/23/15   Zenovia Jarred, DO  potassium chloride 20 MEQ TBCR Take 20 mEq by mouth 2 (two) times daily. Patient not taking: Reported on 12/23/2015 05/26/15   Eileen Stanford, PA-C   BP 140/82 mmHg  Pulse 73  Temp(Src) 98.8 F (37.1 C) (Oral)  Resp 19  Ht 5\' 2"  (1.575 m)  Wt 95.255 kg  BMI 38.40 kg/m2  SpO2 99%  LMP 11/30/2015 Physical Exam  Constitutional: She is oriented to person, place, and time. She appears well-developed and well-nourished. No distress.  HENT:  Head: Normocephalic and atraumatic.  Nose: Nose normal.  Mouth/Throat: Oropharynx is clear and moist.  Eyes: Conjunctivae and EOM are normal. Pupils are equal, round, and reactive to light.  Neck: Normal range of motion. Neck supple. Muscular tenderness present. No spinous process tenderness present.    Cardiovascular: Normal rate, regular rhythm, normal heart sounds and intact distal pulses.   Pulmonary/Chest: Effort normal and breath sounds normal. She exhibits tenderness.    Abdominal: Soft. She exhibits no distension. There is no tenderness.  Musculoskeletal: She exhibits tenderness.       Cervical back: She exhibits tenderness and spasm. She exhibits no bony tenderness.  Neurological: She is alert and oriented to person, place, and time. She has normal strength. No cranial nerve deficit or sensory deficit. Coordination normal. GCS eye subscore is 4. GCS verbal subscore is 5. GCS motor subscore is 6.  Intact finger to nose and heel to shin  Skin: She is not diaphoretic.  Nursing note and  vitals reviewed.   ED Course  Procedures (including critical care time) Labs Review Labs Reviewed  BASIC METABOLIC PANEL - Abnormal; Notable for the following:    Glucose, Bld 102 (*)    All other components within normal limits  CBC  BRAIN NATRIURETIC PEPTIDE  I-STAT TROPOININ, ED  I-STAT TROPOININ, ED    Imaging Review Dg Chest 2 View  12/23/2015  CLINICAL DATA:  Chest pain with shortness of breath for 1 day EXAM: CHEST  2 VIEW COMPARISON:  Chest radiograph August 17, 2015 and chest CT August 17, 2015 FINDINGS: There is no edema or consolidation. The heart size and pulmonary vascularity are normal. No adenopathy. There is degenerative change in the thoracic spine. IMPRESSION: No edema or consolidation. Electronically Signed   By: Lowella Grip III M.D.   On: 12/23/2015 16:42   I have personally reviewed and evaluated these images and lab results as part of my medical decision-making.   EKG Interpretation   Date/Time:  Monday December 23 2015 16:07:27 EDT Ventricular Rate:  92 PR Interval:  154 QRS Duration: 80 QT Interval:  380 QTC Calculation: 469 R Axis:   54 Text Interpretation:  Normal sinus rhythm Normal ECG flipped t wave in  lead III seen on 03/28/14 Otherwise no significant change Reconfirmed by  FLOYD MD, Quillian Quince [phone]) on 12/23/2015 6:22:43 PM      MDM  45 y.o. 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 She is also noted some aggressively worsening associated left neck muscle tenderness and some left anterior chest wall tenderness. He denies any chest pressure, shortness of breath, diaphoresis, nausea, vomiting. She states that she does have some chronic lower extremity edema for which she takes Bumex. Physical exam as above suggestive of migraine headache with associated  muscle spasm in her neck with viscous total chest pain in her left anterior chest wall. EKG was done and showed NSR with one isolated flipped T wave in lead 3 similar to previous given her cardiac history and decision was made to pursue laboratory evaluation with delta. troponin measurement. Her labs returned very reassuringly with negative troponin. Chest x-ray showed no edema or consolidation. She was given migraine cocktail and had complete resolution of her  symptoms. Was prescribed Robaxin and NSAIDs for further symptomatic relief of her musculoskeletal pain. She is recommended to follow up with her primary care physician in the next few days to further discuss her headache symptoms and her muscle pain symptoms. She stated both understanding and agreeable with this plan. return precautions were given.  Final diagnoses:  Migraine without aura and without status migrainosus, not intractable  Musculoskeletal neck pain  Chest wall pain       Zenovia Jarred, DO 12/24/15 [place], DO 12/24/15 1353

## 2015-12-23 NOTE — ED Notes (Signed)
Unable to gain IV access, Suezanne Jacquet, RN attempting IV access

## 2015-12-23 NOTE — Discharge Instructions (Signed)
Chest Wall Pain °Chest wall pain is pain in or around the bones and muscles of your chest. Sometimes, an injury causes this pain. Sometimes, the cause may not be known. This pain may take several weeks or longer to get better. °HOME CARE °Pay attention to any changes in your symptoms. Take these actions to help with your pain: °· Rest as told by your doctor. °· Avoid activities that cause pain. Try not to use your chest, belly (abdominal), or side muscles to lift heavy things. °· If directed, apply ice to the painful area: °¨ Put ice in a plastic bag. °¨ Place a towel between your skin and the bag. °¨ Leave the ice on for 20 minutes, 2-3 times per day. °· Take over-the-counter and prescription medicines only as told by your doctor. °· Do not use tobacco products, including cigarettes, chewing tobacco, and e-cigarettes. If you need help quitting, ask your doctor. °· Keep all follow-up visits as told by your doctor. This is important. °GET HELP IF: °· You have a fever. °· Your chest pain gets worse. °· You have new symptoms. °GET HELP RIGHT AWAY IF: °· You feel sick to your stomach (nauseous) or you throw up (vomit). °· You feel sweaty or light-headed. °· You have a cough with phlegm (sputum) or you cough up blood. °· You are short of breath. °  °This information is not intended to replace advice given to you by your health care provider. Make sure you discuss any questions you have with your health care provider. °  °Document Released: 02/17/2008 Document Revised: 05/22/2015 Document Reviewed: 11/26/2014 °Elsevier Interactive Patient Education ©2016 [employer] ° °

## 2016-01-03 IMAGING — CR DG MYELOGRAPHY LUMBAR INJ LUMBOSACRAL
13 of 17 series · 13 of 17 positions shown · non-contrast
Comparison: Lumbar spine MRI 08/29/2012

CLINICAL DATA: Low back pain and left leg pain. Prior lumbar
surgery.
TECHNIQUE: Contiguous axial images were obtained through the Lumbar spine after
the intrathecal infusion of contrast. Coronal and sagittal
reconstructions were obtained of the axial image sets.

[myelogram  white (1 of 10)]
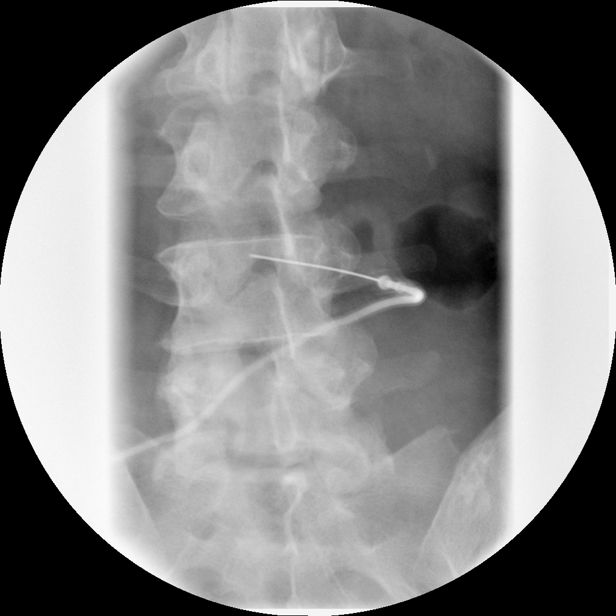

[[hospital]]
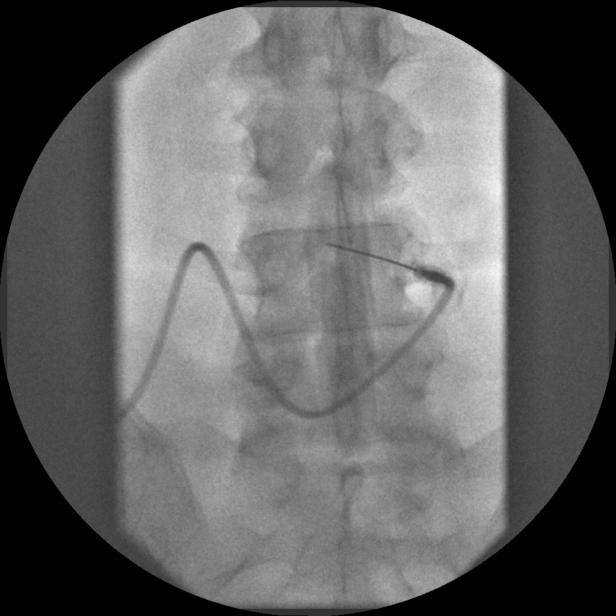

[myelogram  white (2 of 10)]
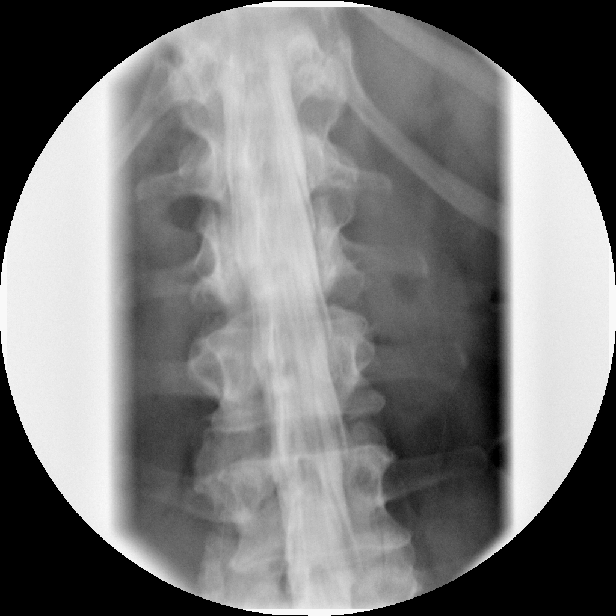

[myelogram  white (3 of 10)]
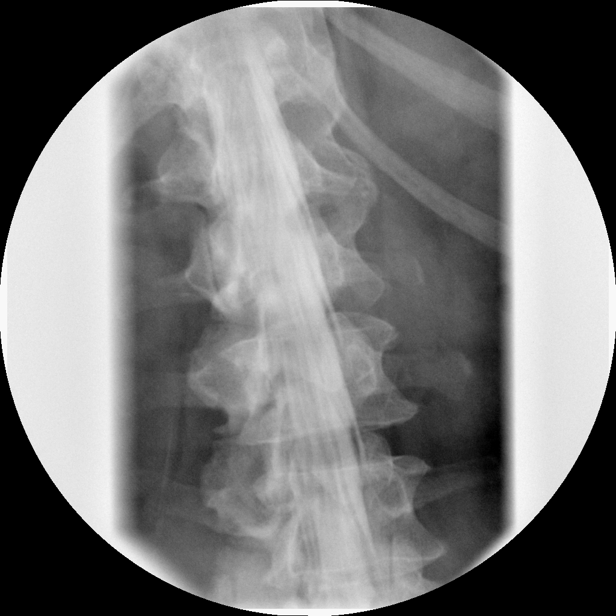

[myelogram  white (4 of 10)]
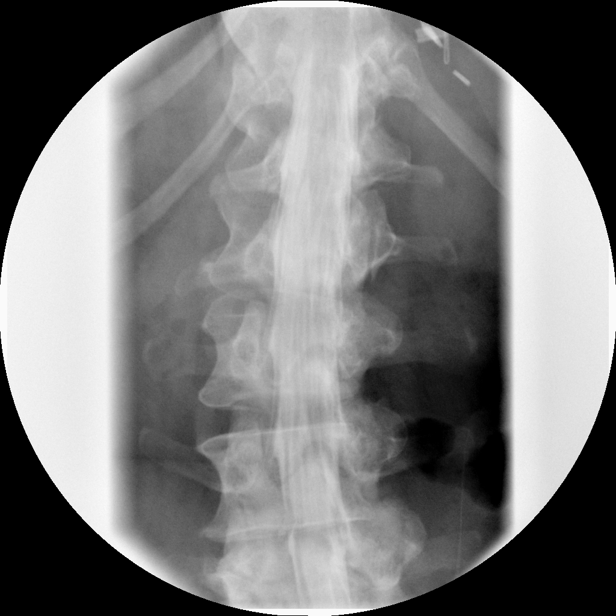

[myelogram  white (5 of 10)]
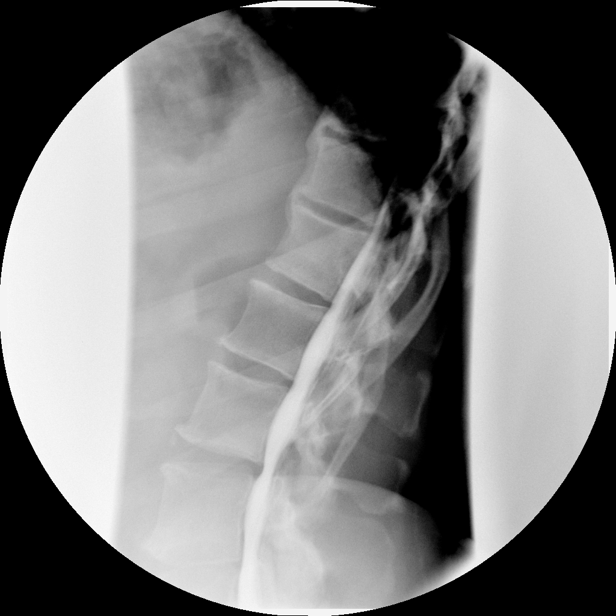

[myelogram  white (6 of 10)]
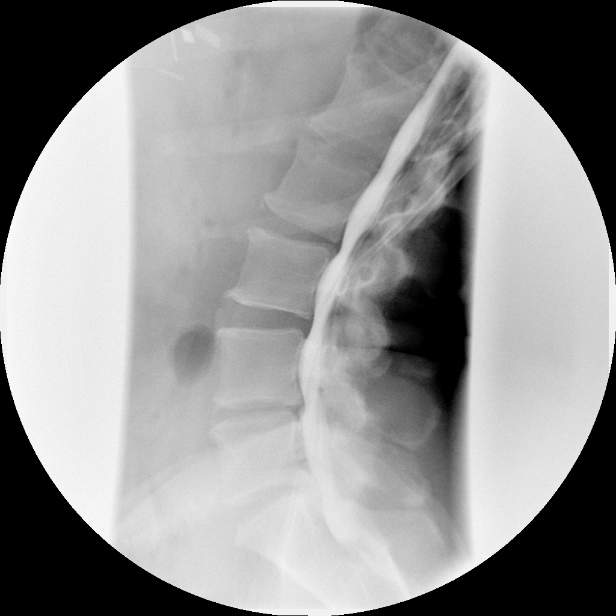

[myelogram  white (7 of 10)]
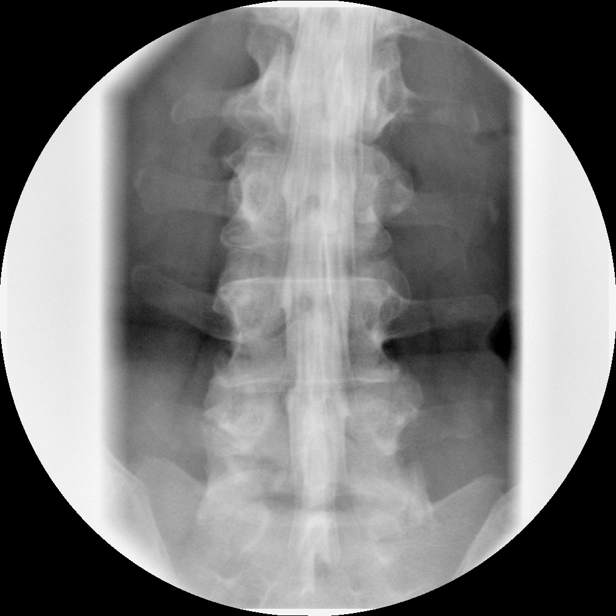

[myelogram  white (8 of 10)]
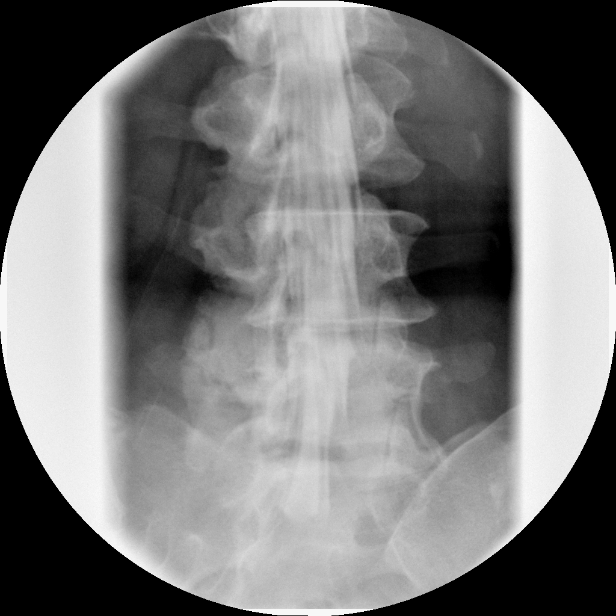

[myelogram  white (9 of 10)]
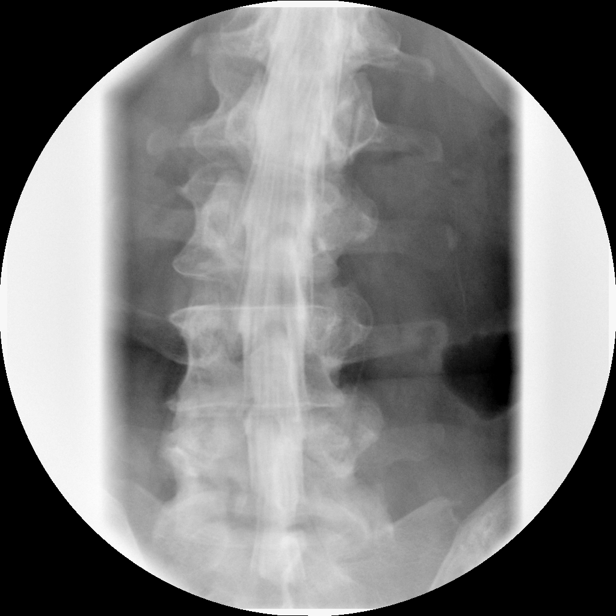

[myelogram  white (10 of 10)]
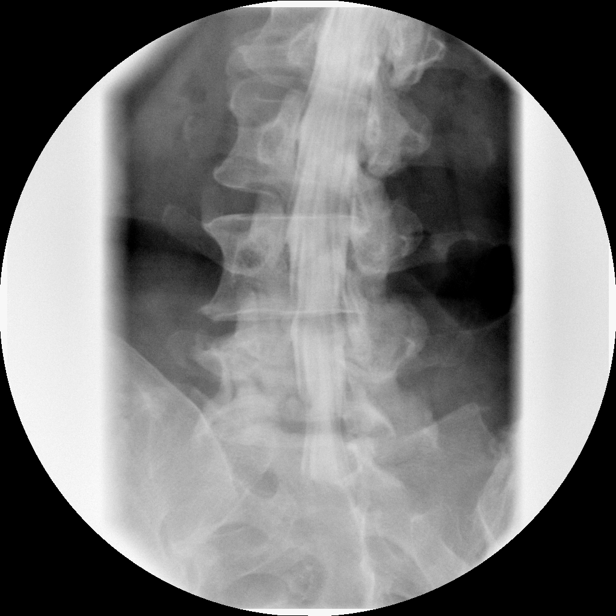

[view not recorded (1 of 2)]
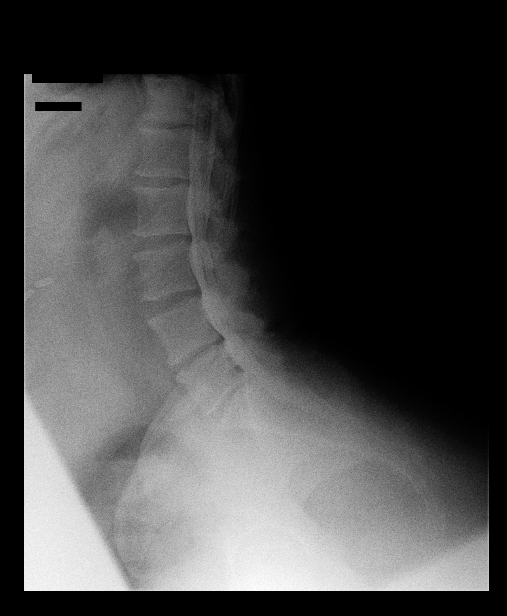

[view not recorded (2 of 2)]
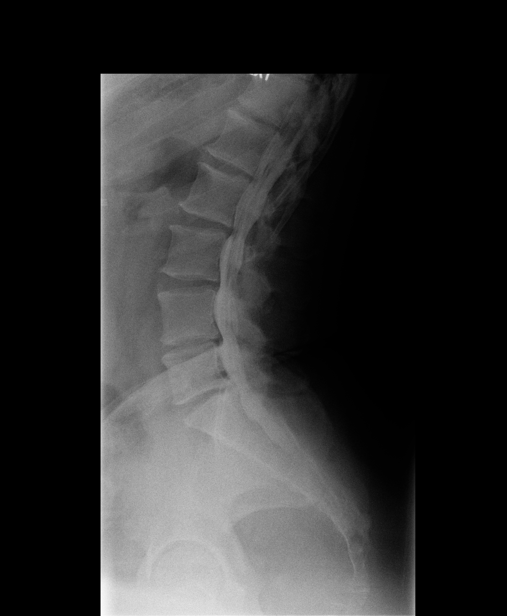

[13 of 17 positions shown; findings below may reference images not displayed]

EXAM:
LUMBAR MYELOGRAM

FLUOROSCOPY TIME:  0 min 59 seconds

PROCEDURE:
After thorough discussion of risks and benefits of the procedure
including bleeding, infection, injury to nerves, blood vessels,
adjacent structures as well as headache and CSF leak, written and
oral informed consent was obtained. Consent was obtained by Dr.
Ramiro Jose Ucharima. Time out form was completed.

Patient was positioned prone on the fluoroscopy table. Local
anesthesia was provided with 1% lidocaine without epinephrine after
prepped and draped in the usual sterile fashion. Puncture was
performed at L3-4 using a 3 1/2 inch 22-gauge spinal needle via a
right paramedian approach. Using a single pass through the dura, the
needle was placed within the thecal sac, with return of clear CSF.
15 mL of Umnipaque-VP8 was injected into the thecal sac, with normal
opacification of the nerve roots and cauda equina consistent with
free flow within the subarachnoid space.

I personally performed the lumbar puncture and administered the
intrathecal contrast. I also personally supervised acquisition of
the myelogram images.
FINDINGS: LUMBAR MYELOGRAM FINDINGS:

There is slight retrolisthesis of L4 on L5. This does not change
with flexion or extension. There is moderate disc space narrowing at
L5-S1 and L4-5. Small ventral epidural defects are seen from
L[DATE]-L5-S1, with posterior endplate spurring contributing at L5-S1
and, to a lesser extent, L4-5. There is lateral recess narrowing
bilaterally at L5-S1.

CT LUMBAR MYELOGRAM FINDINGS:

There is approximately 3 mm retrolisthesis of L4 on L5. There is
moderate disc space narrowing at L4-5 and L5-S1. Small Schmorl's
nodes are noted at L4-5. There is no compression fracture. The conus
medullaris terminates at L1. The visualized paraspinal soft tissues
are unremarkable.

T12-L1:  Minimal disc bulge without stenosis.

L1-2:  No disc herniation or stenosis.  Mild facet arthrosis.

L2-3:  Minimal disc bulge and mild facet arthrosis without stenosis.

L3-4: Moderate facet arthrosis. No significant disc herniation or
stenosis.

L4-5: Prior left hemilaminectomy. Mild disc bulge, endplate
spurring, and moderate facet arthrosis results in mild right and
moderate left neural foraminal stenosis, new from prior. No spinal
canal stenosis.

L5-S1: Prior left hemilaminectomy. There is diffuse osteophytic
ridging. A left paracentral disc osteophyte complex results in
persistent left lateral recess stenosis, in the same area as the
recurrent disc extrusion at described on prior MRI, and possibly
affecting the left S1 nerve root. Disc space narrowing and endplate
spurring resulting in mild right and moderate to severe left neural
foraminal stenosis. There is moderate facet arthrosis.
IMPRESSION: 1. Disc and osteophyte at L5-S1 resulting in left lateral recess
stenosis and possibly affecting the left S1 nerve. There is moderate
to severe left neural foraminal stenosis.
2. Mild to moderate left neural foraminal stenosis at L4-5.

## 2016-01-05 NOTE — Progress Notes (Signed)
Cardiology Office Note   Date:  01/05/2016   ID:  Lori Bean, DOB 03-23-1971, MRN [id number]  PCP:  Lance Bosch, NP  Cardiologist:   [name], MD   No chief complaint on file.     Patient ID: Lori Bean is a 45 y.o. female with hypertension, schizophrenia, bipolar disorder, obesity, prior PE and chronic heart failure, type unknown who presents for follow up.   Interval History 01/06/16:  After her last appointment Lori Bean was referred to the hospital due to concern for unstable angina.  She was admitted 05/2015 and had a low risk nuclear stress test during that hospitalization.  She was seen in the ED with L sided chest pain that was reporducible with palpitation.    History of Present Illness 05/24/15:  Lori Bean reports increasing fatigue and shortness of breath over the last month. She states that she was diagnosed with heart failure over a year ago and has been treated by [facility] cardiology, though I do not see any records to this effect.  She carries a diagnosis of congestive heart failure, though there are no echoes in the system. She's been taking Lasix 80 mg twice daily but does not get a good urinary response. She endorses increased lower extremity edema, two-pillow orthopnea, and PND. She also notes intermittent chest pain over the last month that has now been constant since 3 AM. The chest pain is in the substernal and right side of her chest and radiates to her back. It is 8 out of 10 in severity and constant in nature. It is worse with exertion. Her shortness of breath is also worse since the chest pain began. She endorses diaphoresis but denies nausea or vomiting. The pain is sharp in nature. She did move to a new residence this week, but has not been lifting any heavy boxes or items.   Lori Bean reports a 20 pound weight gain in the last one year. She does not get any exercise due to pain in her back and hip. She has been working to  improve her diet recently. She is increasing the number fruits and vegetables that she eats and decreasing the fried foods.  She drinks mostly water throughout the day.  Lori Bean has been evaluated in the emergency department for chest pain in July 2015 and March 2016. During those visits she had negative cardiac enzymes and her pain improved with a GI cocktail. She states that she has been taking her Nexium and this is not helping her current chest pain. She has not ever followed up with cardiology and did not receive a stress test as planned from the emergency department. She states that she did not previously have insurance.  BNP was elevated to 138 03/2014.   Past Medical History  Diagnosis Date  . Asthma   . Hypertension   . Anxiety   . Snoring disorder     Pt stated my boyfriend always wakes me up and tells me to breathe  . Heart murmur   . CHF (congestive heart failure) (Lori Bean)   . Shortness of breath   . Bronchitis     hx of  . Bipolar affective disorder (Lori Bean)     Schizophrenia  . Depression   . GERD (gastroesophageal reflux disease)   . Arthritis   . Overactive bladder   . Schizophrenia (Lori Bean)   . Sciatica   . Anginal pain (Lori Bean)     occ from asthma  Past Surgical History  Procedure Laterality Date  . Rotator cuff repair      Right shoulder  . Tubal ligation    . Gallstones reomved    . Lumbar laminectomy/decompression microdiscectomy  04/01/2012    Procedure: LUMBAR LAMINECTOMY/DECOMPRESSION MICRODISCECTOMY 2 LEVELS;  Surgeon: Faythe Ghee, MD;  Location: [facility];  Service: Neurosurgery;  Laterality: Left;  Lumbar four-five, lumbar five sacral one microdiscectomy   . Lumbar wound debridement  04/29/2012    Procedure: LUMBAR WOUND DEBRIDEMENT;  Surgeon: Faythe Ghee, MD;  Location: [facility];  Service: Neurosurgery;  Laterality: N/A;  lumbar wound debridement  . Cholecystectomy    . Eye surgery      Metal plate in right eye  . Back surgery      3  back surgeries     Current Outpatient Prescriptions  Medication Sig Dispense Refill  . albuterol (PROAIR HFA) 108 (90 Base) MCG/ACT inhaler INHALE TWO PUFFS INTO THE LUNGS EVERY SIX HOURS AS NEEDED FOR WHEEZING OR SHORTNESS OF BREATH 8.5 g 3  . Albuterol Sulfate (PROAIR RESPICLICK) [id number] (90 Base) MCG/ACT AEPB Inhale 2 puffs into the lungs every 4 (four) hours as needed (Every 4-6 hours as needed for wheezing). (Patient not taking: Reported on 12/23/2015) 1 each 3  . amLODipine (NORVASC) 10 MG tablet TAKE ONE (1) TABLET BY MOUTH EVERY DAY (Patient taking differently: Take 10 mg by mouth daily. ) 30 tablet 5  . ARIPiprazole (ABILIFY) 20 MG tablet Take 1 tablet (20 mg total) by mouth at bedtime. 30 tablet 0  . bumetanide (BUMEX) 2 MG tablet Take 1 tablet (2 mg total) by mouth daily. 60 tablet 11  . chlorhexidine (PERIDEX) 0.12 % solution Use as directed 15 mLs in the mouth or throat 3 (three) times daily. (Patient taking differently: Use as directed 15 mLs in the mouth or throat daily as needed (mouth pain). ) 120 mL 1  . gabapentin (NEURONTIN) 300 MG capsule TAKE ONE (1) CAPSULE BY MOUTH 2 TIMES DAILY 60 capsule 0  . HYDROcodone-acetaminophen (NORCO/VICODIN) 5-325 MG tablet Take 1 tablet by mouth every 6 (six) hours as needed. (Patient not taking: Reported on 12/23/2015) 10 tablet 0  . ibuprofen (ADVIL,MOTRIN) 600 MG tablet Take 1 tablet (600 mg total) by mouth every 8 (eight) hours as needed. 30 tablet 0  . loperamide (IMODIUM A-D) 2 MG tablet Take 1 tablet (2 mg total) by mouth 3 (three) times daily as needed for diarrhea or loose stools. (Patient not taking: Reported on 12/23/2015) 30 tablet 0  . methocarbamol (ROBAXIN) 500 MG tablet Take 1 tablet (500 mg total) by mouth 2 (two) times daily. 20 tablet 0  . omeprazole (PRILOSEC) 20 MG capsule Take 1 capsule (20 mg total) by mouth daily. 30 capsule 6  . oxyCODONE-acetaminophen (PERCOCET) 10-325 MG per tablet Take 1-2 tablets by mouth every 4 (four)  hours as needed for pain. 90 tablet 0  . potassium chloride 20 MEQ TBCR Take 20 mEq by mouth 2 (two) times daily. (Patient not taking: Reported on 12/23/2015) 60 tablet 2  . triamcinolone cream (KENALOG) 0.1 % Apply 1 application topically 2 (two) times daily as needed (foot rash). 30 g 2  . [DISCONTINUED] solifenacin (VESICARE) 5 MG tablet Take 5 mg by mouth daily.     Current Facility-Administered Medications  Medication Dose Route Frequency Provider Last Rate Last Dose  . nitroGLYCERIN (NITROSTAT) SL tablet 0.4 mg  0.4 mg Sublingual Q5 min PRN [name], MD  0.4 mg at 05/24/15 [id number]    Allergies:   Aspirin    Social History:  The patient  reports that she quit smoking about 19 months ago. Her smoking use included Cigarettes. She has a 2.25 pack-year smoking history. She has never used smokeless tobacco. She reports that she does not drink alcohol or use illicit drugs.   Family History:  The patient's family history includes Cancer in her paternal aunt; Diabetes in her father and mother; Heart disease in her paternal aunt.    ROS:  Please see the history of present illness.   Otherwise, review of systems are positive for none.   All other systems are reviewed and negative.    PHYSICAL EXAM: VS:  LMP 11/30/2015 , BMI There is no weight on file to calculate BMI. GENERAL:  Well appearing HEENT:  Pupils equal round and reactive, fundi not visualized, oral mucosa unremarkable NECK:  No jugular venous distention, waveform within normal limits, carotid upstroke brisk and symmetric, no bruits, no thyromegaly LYMPHATICS:  No cervical adenopathy LUNGS:  Clear to auscultation bilaterally HEART:  RRR.  PMI not displaced or sustained,S1 and S2 within normal limits, no S3, no S4, no clicks, no rubs, no murmurs ABD:  Flat, positive bowel sounds normal in frequency in pitch, no bruits, no rebound, no guarding, no midline pulsatile mass, no hepatomegaly, no splenomegaly EXT:  2 plus pulses  throughout, 2+ pitting edema to mid tibia bilaterally, no cyanosis no clubbing SKIN:  No rashes no nodules NEURO:  Cranial nerves II through XII grossly intact, motor grossly intact throughout PSYCH:  Cognitively intact, oriented to person place and time    EKG:  EKG is ordered today. The ekg ordered today demonstrates sinus rhythm.  Non-specific T wave changes.  Prolonged QT.   Recent Labs: 03/04/2015: TSH 0.362 06/19/2015: ALT 18 12/23/2015: B Natriuretic Peptide 7.8; BUN 18; Creatinine, Ser 0.81; Hemoglobin 13.0; Platelets 312; Potassium 4.8; Sodium 140    Lipid Panel    Component Value Date/Time   CHOL 133 05/25/2015 0333   TRIG 105 05/25/2015 0333   HDL 50 05/25/2015 0333   CHOLHDL 2.7 05/25/2015 0333   VLDL 21 05/25/2015 0333   LDLCALC 62 05/25/2015 0333      Wt Readings from Last 3 Encounters:  12/23/15 95.255 kg (210 lb)  11/07/15 96.979 kg (213 lb 12.8 oz)  06/19/15 101.606 kg (224 lb)      Other studies Reviewed: Additional studies/ records that were reviewed today include: . Review of the above records demonstrates:  Please see elsewhere in the note.     ASSESSMENT AND PLAN:  # Chest pain: Symptoms are atypical in that there are ongoing since 3 AM. However they are exertional and she has a history of hypertension and recently stopped smoking. Her chest pain was not responsive to nitroglycerin in the office. Anterolateral T wave inversions on ECG are new.  We are referring her to the hospital for a cardiac rule out. Assuming this is normal, she will need stress testing. Given her back and leg pain she is likely to need a [employer].   Ms. Lemasters has an aspirin allergy so this was not administered.  # Acute on chronic CHF, type unknown:  Ms. Murley has LE edema and heart failure symptoms.  Her next veins do not appear elevated. Given that she has no echo in her system is difficult to determine how to best manage her. I'm concerned the amlodipine may  not be the best  medication for her blood pressure. It's unclear to me whether her lower extremity edema is due to amlodipine or Almyra Free due to heart failure, as her central venous pressure does not seem to be very elevated. It's possible that Lasix is not working because she has GI edema. We will switch her Lasix to Bumex 2 mg twice a day. We are referring her to the ED for chest pain. It would be helpful if she could get an echo this admission so that we can better manage her heart failure.  # Hypertension: BP well-controlled.  Continue amlodipine for now, and will change her mediation based on echo results.  # CV Disease prevention: Needs lipid panel checked.  She is going to the ED via EMS, so this can be checked in the hospital or at her next appointment.  # Obesity: Patient was instructed to increase her exercise to 30-40 minutes daily on most days of the week. She will begin this once MI is ruled out and CHF is better managed.  Current medicines are reviewed at length with the patient today.  The patient does not have concerns regarding medicines.  The following changes have been made:  Change furosemide to bumex.  Labs/ tests ordered today include:   No orders of the defined types were placed in this encounter.     Disposition:  To the ED via EMS.  FU with Dr. Lilli Light. [place] in 1 month.    Signed, [name], MD  01/05/2016 4:02 PM    [facility] This encounter was created in error - please disregard.

## 2016-01-06 ENCOUNTER — Encounter: Payer: Medicaid Other | Admitting: Cardiovascular Disease

## 2016-01-06 ENCOUNTER — Other Ambulatory Visit (HOSPITAL_COMMUNITY): Payer: Self-pay | Admitting: Gastroenterology

## 2016-01-06 DIAGNOSIS — R197 Diarrhea, unspecified: Secondary | ICD-10-CM

## 2016-01-15 NOTE — Progress Notes (Signed)
Cardiology Office Note   Date:  01/16/2016   ID:  CHELBIE BILLIE, DOB 1971/04/13, MRN [id number]  PCP:  Lance Bosch, NP  Cardiologist:   [name], MD   Chief Complaint  Patient presents with  . Follow-up    overdue 1 month--ED in April  pt c/o sharp chest tightness and SOB on exertion, random dizziness, swelling in legs/feet/ankles, severe cramping in left leg      Patient ID: Lori Bean is a 45 y.o. female with hypertension, schizophrenia, bipolar disorder, obesity, prior PE and chronic diastolic heart failure who presents for follow up.    Interval History 01/16/16:  After her last appointment Lori Bean was referred to the hospital due to concern for unstable angina.  She was admitted 05/2015 and had a low risk nuclear stress test during that hospitalization.  She was seen in the ED with L sided chest pain that was reporducible with palpitation.   She has been feeling tired and short of breath. She noted shortness of breath when walking around her house. She endorses continued lower extremity edema that she thinks is somewhat improved since switching from Lasix to Bumex. She sleeps on 3 pillows and denies PND.  She continues to have episodes of intermittent chest discomfort both at rest and with stress.  On CT scan Lori Bean was incidentally noted to have adrenal hyperplasia. Her blood pressure has been well-controlled on one antihypertensive. She has intermittently had mild hypokalemia.   History of Present Illness 05/24/15:  Lori Bean reports increasing fatigue and shortness of breath over the last month. She states that she was diagnosed with heart failure over a year ago and has been treated by [facility] cardiology, though I do not see any records to this effect.  She carries a diagnosis of congestive heart failure, though there are no echoes in the system. She's been taking Lasix 80 mg twice daily but does not get a good urinary response. She  endorses increased lower extremity edema, two-pillow orthopnea, and PND. She also notes intermittent chest pain over the last month that has now been constant since 3 AM. The chest pain is in the substernal and right side of her chest and radiates to her back. It is 8 out of 10 in severity and constant in nature. It is worse with exertion. Her shortness of breath is also worse since the chest pain began. She endorses diaphoresis but denies nausea or vomiting. The pain is sharp in nature. She did move to a new residence this week, but has not been lifting any heavy boxes or items.   Lori Bean reports a 20 pound weight gain in the last one year. She does not get any exercise due to pain in her back and hip. She has been working to improve her diet recently. She is increasing the number fruits and vegetables that she eats and decreasing the fried foods.  She drinks mostly water throughout the day.  Lori Bean has been evaluated in the emergency department for chest pain in July 2015 and March 2016. During those visits she had negative cardiac enzymes and her pain improved with a GI cocktail. She states that she has been taking her Nexium and this is not helping her current chest pain. She has not ever followed up with cardiology and did not receive a stress test as planned from the emergency department. She states that she did not previously have insurance.  BNP was elevated to 138 03/2014.  Past Medical History  Diagnosis Date  . Asthma   . Hypertension   . Anxiety   . Snoring disorder     Pt stated my boyfriend always wakes me up and tells me to breathe  . Heart murmur   . CHF (congestive heart failure) ([place])   . Shortness of breath   . Bronchitis     hx of  . Bipolar affective disorder ([place])     Schizophrenia  . Depression   . GERD (gastroesophageal reflux disease)   . Arthritis   . Overactive bladder   . Schizophrenia ([place])   . Sciatica   . Anginal pain ([place])     occ from asthma     Past Surgical History  Procedure Laterality Date  . Rotator cuff repair      Right shoulder  . Tubal ligation    . Gallstones reomved    . Lumbar laminectomy/decompression microdiscectomy  04/01/2012    Procedure: LUMBAR LAMINECTOMY/DECOMPRESSION MICRODISCECTOMY 2 LEVELS;  Surgeon: Faythe Ghee, MD;  Location: [facility]UR[facility] ORS;  Service: Neurosurgery;  Laterality: Left;  Lumbar four-five, lumbar five sacral one microdiscectomy   . Lumbar wound debridement  04/29/2012    Procedure: LUMBAR WOUND DEBRIDEMENT;  Surgeon: Faythe Ghee, MD;  Location: [place] NEURO ORS;  Service: Neurosurgery;  Laterality: N/A;  lumbar wound debridement  . Cholecystectomy    . Eye surgery      Metal plate in right eye  . Back surgery      3 back surgeries     Current Outpatient Prescriptions  Medication Sig Dispense Refill  . albuterol (PROAIR HFA) 108 (90 Base) MCG/ACT inhaler INHALE TWO PUFFS INTO THE LUNGS EVERY SIX HOURS AS NEEDED FOR WHEEZING OR SHORTNESS OF BREATH (Patient taking differently: Inhale 2 puffs into the lungs every 6 (six) hours as needed for wheezing or shortness of breath. ) 8.5 g 3  . amLODipine (NORVASC) 10 MG tablet TAKE ONE (1) TABLET BY MOUTH EVERY DAY (Patient taking differently: Take 10 mg by mouth daily. ) 30 tablet 5  . ARIPiprazole (ABILIFY) 20 MG tablet Take 1 tablet (20 mg total) by mouth at bedtime. 30 tablet 0  . bumetanide (BUMEX) 2 MG tablet Take 1 tablet (2 mg total) by mouth daily. 60 tablet 11  . chlorhexidine (PERIDEX) 0.12 % solution Use as directed 15 mLs in the mouth or throat 3 (three) times daily. (Patient taking differently: Use as directed 15 mLs in the mouth or throat daily as needed (mouth pain). ) 120 mL 1  . gabapentin (NEURONTIN) 300 MG capsule TAKE ONE (1) CAPSULE BY MOUTH 2 TIMES DAILY 60 capsule 0  . omeprazole (PRILOSEC) 20 MG capsule Take 1 capsule (20 mg total) by mouth daily. 30 capsule 6  . potassium chloride 20 MEQ TBCR Take 20 mEq by mouth 2 (two)  times daily. 60 tablet 2  . triamcinolone cream (KENALOG) 0.1 % Apply 1 application topically 2 (two) times daily as needed (foot rash). 30 g 2  . [DISCONTINUED] solifenacin (VESICARE) 5 MG tablet Take 5 mg by mouth daily.     Current Facility-Administered Medications  Medication Dose Route Frequency Provider Last Rate Last Dose  . nitroGLYCERIN (NITROSTAT) SL tablet 0.4 mg  0.4 mg Sublingual Q5 min PRN [name], MD   0.4 mg at 05/24/15 0850    Allergies:   Aspirin    Social History:  The patient  reports that she quit smoking about 19 months ago. Her smoking use  included Cigarettes. She has a 2.25 pack-year smoking history. She has never used smokeless tobacco. She reports that she does not drink alcohol or use illicit drugs.   Family History:  The patient's family history includes Cancer in her paternal aunt; Diabetes in her father and mother; Heart disease in her paternal aunt.    ROS:  Please see the history of present illness.   Otherwise, review of systems are positive for none.   All other systems are reviewed and negative.    PHYSICAL EXAM: VS:  BP 130/82 mmHg  Pulse 80  Ht 5\' 2"  (1.575 m)  Wt 96.888 kg (213 lb 9.6 oz)  BMI 39.06 kg/m2  LMP 11/30/2015 , BMI Body mass index is 39.06 kg/(m^2). GENERAL:  Well appearing HEENT:  Pupils equal round and reactive, fundi not visualized, oral mucosa unremarkable NECK:  No jugular venous distention, waveform within normal limits, carotid upstroke brisk and symmetric, no bruits, no thyromegaly LYMPHATICS:  No cervical adenopathy LUNGS:  Clear to auscultation bilaterally HEART:  RRR.  PMI not displaced or sustained,S1 and S2 within normal limits, no S3, no S4, no clicks, no rubs, no murmurs ABD:  Flat, positive bowel sounds normal in frequency in pitch, no bruits, no rebound, no guarding, no midline pulsatile mass, no hepatomegaly, no splenomegaly EXT:  2 plus pulses throughout, no edema,  no cyanosis no clubbing SKIN:  No  rashes no nodules NEURO:  Cranial nerves II through XII grossly intact, motor grossly intact throughout PSYCH:  Cognitively intact, oriented to person place and time  EKG:  EKG is ordered today. The ekg ordered today demonstrates sinus rhythm. Rate 80 bpm.  Non-specific T wave changes.  One aberrantly conducted PAC.  Prolonged QT.  Echo 05/24/15: Study Conclusions  - Left ventricle: The cavity size was normal. Wall thickness was  increased in a pattern of mild LVH. Systolic function was normal.  The estimated ejection fraction was in the range of 60% to 65%.  Wall motion was normal; there were no regional wall motion  abnormalities. Features are consistent with a pseudonormal left  ventricular filling pattern, with concomitant abnormal relaxation  and increased filling pressure (grade 2 diastolic dysfunction).  Recent Labs: 03/04/2015: TSH 0.362 06/19/2015: ALT 18 12/23/2015: B Natriuretic Peptide 7.8; BUN 18; Creatinine, Ser 0.81; Hemoglobin 13.0; Platelets 312; Potassium 4.8; Sodium 140    Lipid Panel    Component Value Date/Time   CHOL 133 05/25/2015 0333   TRIG 105 05/25/2015 0333   HDL 50 05/25/2015 0333   CHOLHDL 2.7 05/25/2015 0333   VLDL 21 05/25/2015 0333   LDLCALC 62 05/25/2015 0333      Wt Readings from Last 3 Encounters:  01/16/16 96.888 kg (213 lb 9.6 oz)  12/23/15 95.255 kg (210 lb)  11/07/15 96.979 kg (213 lb 12.8 oz)     Other studies Reviewed: Additional studies/ records that were reviewed today include: . Review of the above records demonstrates:  Please see elsewhere in the note.     ASSESSMENT AND PLAN:  # Hypertension:  Blood pressure is well-controlled. Continue amlodipine.   # Chronic diastolic heart failure:  # Obesity: # Shortness of breath: Lori Bean is euvolemic on exam today. Her BNP was checked in the hospital last month and was 8. I do not think that her shortness of breath is due to heart failure rather, it is likely due to  obesity and deconditioning. I told her that the left she does the left she will be able to  do. I encouraged her to start walking for at least 30 minutes most days of the week. She asked that I would prescribe her some diet pills. I reported that I would not as these have been linked to heart failure and tachycardia. I did recommend that she make dietary interventions and increase her exercise as above. If this fails, she could be considered a candidate for weight loss surgery. Continue Bumex.  # Chest pain: Likely musculoskeletal. Her chest pain is reproducible with palpation and she had a negative stress test.  # Adrenal hyperplasia: We will check serum renin and aldosterone to rule out hyper aldosterone anemia. I suspect that she does not have this as her blood pressure is well-controlled on one antihypertensive.    Current medicines are reviewed at length with the patient today.  The patient does not have concerns regarding medicines.  The following changes have been made:  Change furosemide to bumex.  Labs/ tests ordered today include:    Orders Placed This Encounter  Procedures  . Aldosterone + renin activity w/ ratio  . EKG 12-Lead     Disposition:  To the ED via EMS.  FU with Dr. Lilli Light. [place] in 3 months.    Signed, [name], MD  01/16/2016 1:18 PM    [place]

## 2016-01-16 ENCOUNTER — Encounter: Payer: Self-pay | Admitting: Cardiovascular Disease

## 2016-01-16 ENCOUNTER — Ambulatory Visit (INDEPENDENT_AMBULATORY_CARE_PROVIDER_SITE_OTHER): Payer: Medicaid Other | Admitting: Cardiovascular Disease

## 2016-01-16 ENCOUNTER — Encounter (HOSPITAL_COMMUNITY): Payer: Self-pay | Admitting: *Deleted

## 2016-01-16 VITALS — BP 130/82 | HR 80 | Ht 62.0 in | Wt 213.6 lb

## 2016-01-16 DIAGNOSIS — R0602 Shortness of breath: Secondary | ICD-10-CM | POA: Diagnosis not present

## 2016-01-16 DIAGNOSIS — I5032 Chronic diastolic (congestive) heart failure: Secondary | ICD-10-CM

## 2016-01-16 DIAGNOSIS — I10 Essential (primary) hypertension: Secondary | ICD-10-CM | POA: Diagnosis not present

## 2016-01-16 DIAGNOSIS — E278 Other specified disorders of adrenal gland: Secondary | ICD-10-CM | POA: Diagnosis not present

## 2016-01-16 NOTE — Patient Instructions (Addendum)
Medication Instructions:  Your physician recommends that you continue on your current medications as directed. Please refer to the Current Medication list given to you today.  Labwork: Lab on the first floor at [facility]   Testing/Procedures: none  Follow-Up: Your physician recommends that you schedule a follow-up appointment in: 3 month ov  If you need a refill on your cardiac medications before your next appointment, please call your pharmacy.

## 2016-01-22 ENCOUNTER — Other Ambulatory Visit: Payer: Self-pay | Admitting: Gastroenterology

## 2016-01-22 ENCOUNTER — Ambulatory Visit: Payer: Medicaid Other | Admitting: Cardiovascular Disease

## 2016-01-22 ENCOUNTER — Other Ambulatory Visit (HOSPITAL_COMMUNITY): Payer: Self-pay | Admitting: Gastroenterology

## 2016-01-22 ENCOUNTER — Ambulatory Visit (HOSPITAL_COMMUNITY)
Admission: RE | Admit: 2016-01-22 | Discharge: 2016-01-22 | Disposition: A | Discharge status: Home / Self Care | Payer: Medicaid Other | Source: Ambulatory Visit | Attending: Gastroenterology | Admitting: Gastroenterology

## 2016-01-22 DIAGNOSIS — Z452 Encounter for adjustment and management of vascular access device: Secondary | ICD-10-CM | POA: Diagnosis not present

## 2016-01-22 DIAGNOSIS — R197 Diarrhea, unspecified: Secondary | ICD-10-CM

## 2016-01-22 MED ORDER — LIDOCAINE HCL 1 % IJ SOLN
INTRAMUSCULAR | Status: AC
  Filled 2016-01-22: qty 20

## 2016-01-22 MED ORDER — HEPARIN SOD (PORK) LOCK FLUSH 100 UNIT/ML IV SOLN
INTRAVENOUS | Status: AC
  Filled 2016-01-22: qty 5

## 2016-01-22 NOTE — Procedures (Signed)
Interventional Radiology Procedure Note  Procedure: Placement of a right brachial vein approach single lumen PowerPicc, for pending colonoscopy.  Tip is positioned at the superior cavoatrial junction and catheter is ready for immediate use.  Complications: No immediate Recommendations:  - Ok to shower tomorrow - Do not submerge - Routine line care   Signed,  Dulcy Fanny. Earleen Newport, DO

## 2016-01-23 ENCOUNTER — Ambulatory Visit (HOSPITAL_COMMUNITY)
Admission: RE | Admit: 2016-01-23 | Discharge: 2016-01-23 | Disposition: A | Discharge status: Home / Self Care | Payer: Medicaid Other | Source: Ambulatory Visit | Attending: Gastroenterology | Admitting: Gastroenterology

## 2016-01-23 ENCOUNTER — Encounter (HOSPITAL_COMMUNITY)
Admission: RE | Disposition: A | Discharge status: Home / Self Care | Payer: Self-pay | Source: Ambulatory Visit | Attending: Gastroenterology

## 2016-01-23 ENCOUNTER — Ambulatory Visit (HOSPITAL_COMMUNITY): Payer: Medicaid Other | Admitting: Certified Registered"

## 2016-01-23 ENCOUNTER — Encounter (HOSPITAL_COMMUNITY): Payer: Self-pay

## 2016-01-23 DIAGNOSIS — I10 Essential (primary) hypertension: Secondary | ICD-10-CM | POA: Insufficient documentation

## 2016-01-23 DIAGNOSIS — F209 Schizophrenia, unspecified: Secondary | ICD-10-CM | POA: Diagnosis not present

## 2016-01-23 DIAGNOSIS — K635 Polyp of colon: Secondary | ICD-10-CM | POA: Insufficient documentation

## 2016-01-23 DIAGNOSIS — Z87891 Personal history of nicotine dependence: Secondary | ICD-10-CM | POA: Diagnosis not present

## 2016-01-23 DIAGNOSIS — R197 Diarrhea, unspecified: Secondary | ICD-10-CM | POA: Insufficient documentation

## 2016-01-23 HISTORY — PX: COLONOSCOPY WITH PROPOFOL: SHX5780

## 2016-01-23 LAB — ALDOSTERONE + RENIN ACTIVITY W/ RATIO
ALDO / PRA Ratio: 25.9 Ratio (ref 0.9–28.9)
Aldosterone: 7 ng/dL
PRA LC/MS/MS: 0.27 ng/mL/h (ref 0.25–5.82)

## 2016-01-23 SURGERY — COLONOSCOPY WITH PROPOFOL
Anesthesia: Monitor Anesthesia Care

## 2016-01-23 MED ORDER — PROPOFOL 10 MG/ML IV BOLUS
INTRAVENOUS | Status: AC
  Filled 2016-01-23: qty 20

## 2016-01-23 MED ORDER — SODIUM CHLORIDE 0.9 % IV SOLN: INTRAVENOUS | Status: DC

## 2016-01-23 MED ORDER — LIDOCAINE HCL (CARDIAC) 20 MG/ML IV SOLN
INTRAVENOUS | Status: AC
  Filled 2016-01-23: qty 5

## 2016-01-23 MED ORDER — LACTATED RINGERS IV SOLN
INTRAVENOUS | Status: DC | PRN
  Administered 2016-01-23: 11:00:00 via INTRAVENOUS

## 2016-01-23 MED ORDER — PROPOFOL 500 MG/50ML IV EMUL
INTRAVENOUS | Status: DC | PRN
  Administered 2016-01-23: 200 ug/kg/min via INTRAVENOUS

## 2016-01-23 MED ORDER — PROPOFOL 10 MG/ML IV BOLUS
INTRAVENOUS | Status: AC
  Filled 2016-01-23: qty 40

## 2016-01-23 MED ORDER — PROPOFOL 10 MG/ML IV BOLUS
INTRAVENOUS | Status: DC | PRN
  Administered 2016-01-23: 50 mg via INTRAVENOUS
  Administered 2016-01-23: 20 mg via INTRAVENOUS

## 2016-01-23 SURGICAL SUPPLY — 21 items

## 2016-01-23 NOTE — Op Note (Signed)
[facility] Patient Name: Lori Bean Procedure Date: 01/23/2016 MRN: [id number] Attending MD: Wonda Horner , MD Date of Birth: 1971-03-30 CSN: [id number] Age: 45 Admit Type: Outpatient Procedure:                Colonoscopy Indications:              Clinically significant diarrhea of unexplained                            origin Providers:                Wonda Horner, MD, Jobe Igo, RN, Cletis Athens, Technician Referring MD:              Medicines:                Propofol per Anesthesia Complications:            No immediate complications. Estimated Blood Loss:     Estimated blood loss: none. Procedure:                Pre-Anesthesia Assessment:                           - Prior to the procedure, a History and Physical                            was performed, and patient medications and                            allergies were reviewed. The patient's tolerance of                            previous anesthesia was also reviewed. The risks                            and benefits of the procedure and the sedation                            options and risks were discussed with the patient.                            All questions were answered, and informed consent                            was obtained. Prior Anticoagulants: The patient has                            taken no previous anticoagulant or antiplatelet                            agents. ASA Grade Assessment: II - A patient with                            mild systemic disease. After  reviewing the risks                            and benefits, the patient was deemed in                            satisfactory condition to undergo the procedure.                           After obtaining informed consent, the colonoscope                            was passed under direct vision. Throughout the                            procedure, the patient's blood pressure, pulse,  and                            oxygen saturations were monitored continuously. The                            EC-[id number] scope was introduced through                            the anus and advanced to the the cecum, identified                            by the ileocecal valve. The ileocecal valve and the                            rectum were photographed. The colonoscopy was                            performed without difficulty. The patient tolerated                            the procedure well. The quality of the bowel                            preparation was good. Scope In: 11:26:07 AM Scope Out: 11:40:36 AM Scope Withdrawal Time: 0 hours 7 minutes 51 seconds  Total Procedure Duration: 0 hours 14 minutes 29 seconds  Findings:      The perianal and digital rectal examinations were normal.      A 3 mm polyp was found in the sigmoid colon. The polyp was sessile. The       polyp was removed with a cold biopsy forceps. Resection and retrieval       were complete.      The exam was otherwise without abnormality.      Biopsies were taken with a cold forceps for histology. Impression:               - One 3 mm polyp in the sigmoid colon, removed with  a cold biopsy forceps. Resected and retrieved.                           - The examination was otherwise normal.                           - Biopsies were taken with a cold forceps for                            histology. Moderate Sedation:      . Recommendation:           - Resume regular diet.                           - Continue present medications.                           - Await pathology results.                           - Repeat colonoscopy for surveillance based on                            pathology results. Procedure Code(s):        --- Professional ---                           [phone], Colonoscopy, flexible; with biopsy, single                            or multiple Diagnosis Code(s):         --- Professional ---                           D12.5, Benign neoplasm of sigmoid colon                           R19.7, Diarrhea, unspecified CPT copyright 2016 American Medical Association. All rights reserved. The codes documented in this report are preliminary and upon coder review may  be revised to meet current compliance requirements. Anson Fret, MD Wonda Horner, MD 01/23/2016 11:47:33 AM This report has been signed electronically. Number of Addenda: 0

## 2016-01-23 NOTE — Progress Notes (Signed)
Left message for [facility] social worker, awaiting call back.

## 2016-01-23 NOTE — H&P (Signed)
  The patient is a 45 year old female who presents to the outpatient endoscopy at the hospital for a colonoscopy. She has been experiencing diarrhea of uncertain etiology.  Physical:  She is in no distress  Nonicteric  Heart regular rhythm no murmurs  Lungs clear  Abdomen: Bowel sounds present, soft, nontender  Impression: Diarrhea etiology unclear  Plan colonoscopy

## 2016-01-23 NOTE — Progress Notes (Signed)
Talked to [facility] in social work concerning the patients concern at home. She is going to talk to her supervisor about this matter stating they usually see inpatients. Will wait for further response.
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
## 2016-01-23 NOTE — Anesthesia Postprocedure Evaluation (Signed)
Anesthesia Post Note  Patient: Lori Bean  Procedure(s) Performed: Procedure(s) (LRB): COLONOSCOPY WITH PROPOFOL (N/A)  Patient location during evaluation: PACU Anesthesia Type: MAC Level of consciousness: awake and alert Pain management: pain level controlled Vital Signs Assessment: post-procedure vital signs reviewed and stable Respiratory status: spontaneous breathing, nonlabored ventilation, respiratory function stable and patient connected to nasal cannula oxygen Cardiovascular status: stable and blood pressure returned to baseline Anesthetic complications: no    Last Vitals:  Filed Vitals:   01/23/16 0947 01/23/16 1145  BP:  156/86  Pulse: 66   Temp: 36.7 C   Resp: 12     Last Pain: There were no vitals filed for this visit.               Riccardo Dubin

## 2016-01-23 NOTE — Transfer of Care (Signed)
Immediate Anesthesia Transfer of Care Note  Patient: Lori Bean  Procedure(s) Performed: Procedure(s): COLONOSCOPY WITH PROPOFOL (N/A)  Patient Location: PACU  Anesthesia Type:MAC  Level of Consciousness:  sedated, patient cooperative and responds to stimulation  Airway & Oxygen Therapy:Patient Spontanous Breathing and Patient connected to face mask oxgen  Post-op Assessment:  Report given to PACU RN and Post -op Vital signs reviewed and stable  Post vital signs:  Reviewed and stable  Last Vitals:  Filed Vitals:   01/23/16 0947  Pulse: 66  Temp: 36.7 C  Resp: 12    Complications: No apparent anesthesia complications

## 2016-01-23 NOTE — Discharge Instructions (Signed)

## 2016-01-23 NOTE — Anesthesia Preprocedure Evaluation (Signed)
Anesthesia Evaluation  Patient identified by MRN, date of birth, ID band Patient awake    Reviewed: Allergy & Precautions, H&P , Patient's Chart, lab work & pertinent test results, reviewed documented beta blocker date and time   Airway Mallampati: II  TM Distance: >3 FB Neck ROM: full    Dental no notable dental hx.    Pulmonary former smoker,    Pulmonary exam normal breath sounds clear to auscultation       Cardiovascular hypertension,  Rhythm:regular Rate:Normal     Neuro/Psych    GI/Hepatic   Endo/Other    Renal/GU      Musculoskeletal   Abdominal   Peds  Hematology   Anesthesia Other Findings Asthma    Hypertension      Anxiety    Snoring disorder poss OSA   Heart murmur    CHF    Shortness of breath  Schizophrenia Depression      GERD (gastroesophageal reflux disease)    Arthritis        Anginal pain   Reproductive/Obstetrics                             Anesthesia Physical Anesthesia Plan  ASA: II  Anesthesia Plan: MAC   Post-op Pain Management:    Induction: Intravenous  Airway Management Planned: Mask and Natural Airway  Additional Equipment:   Intra-op Plan:   Post-operative Plan:   Informed Consent: I have reviewed the patients History and Physical, chart, labs and discussed the procedure including the risks, benefits and alternatives for the proposed anesthesia with the patient or authorized representative who has indicated his/her understanding and acceptance.   Dental Advisory Given  Plan Discussed with: CRNA and Surgeon  Anesthesia Plan Comments: (Discussed sedation and potential to need to place airway or ETT if warranted by clinical changes intra-operatively. We will start procedure as MAC.)        Anesthesia Quick Evaluation
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
## 2016-01-23 NOTE — Progress Notes (Signed)
So the Social worker called back before the patient left and actually talked to the patient about her situation before leaving.

## 2016-01-24 ENCOUNTER — Other Ambulatory Visit (HOSPITAL_COMMUNITY): Payer: Self-pay | Admitting: Gastroenterology

## 2016-01-24 ENCOUNTER — Ambulatory Visit (HOSPITAL_COMMUNITY)
Admission: RE | Admit: 2016-01-24 | Discharge: 2016-01-24 | Disposition: A | Discharge status: Home / Self Care | Payer: Medicaid Other | Source: Ambulatory Visit | Attending: Gastroenterology | Admitting: Gastroenterology

## 2016-01-24 ENCOUNTER — Encounter (HOSPITAL_COMMUNITY): Payer: Self-pay | Admitting: Gastroenterology

## 2016-01-24 DIAGNOSIS — R197 Diarrhea, unspecified: Secondary | ICD-10-CM

## 2016-01-24 NOTE — Procedures (Signed)
Removed PICC line, no complications.

## 2016-01-24 NOTE — Progress Notes (Signed)
Remove PICC line, no complications.  France Ravens RT R, VI

## 2016-02-24 IMAGING — CR DG ANKLE COMPLETE 3+V*L*
3 series · 3 of 3 positions shown · non-contrast
Comparison: None.

CLINICAL DATA: L foot/ankle pain/swelling

EXAM:
LEFT ANKLE COMPLETE - 3+ VIEW

[x ankle ap left]
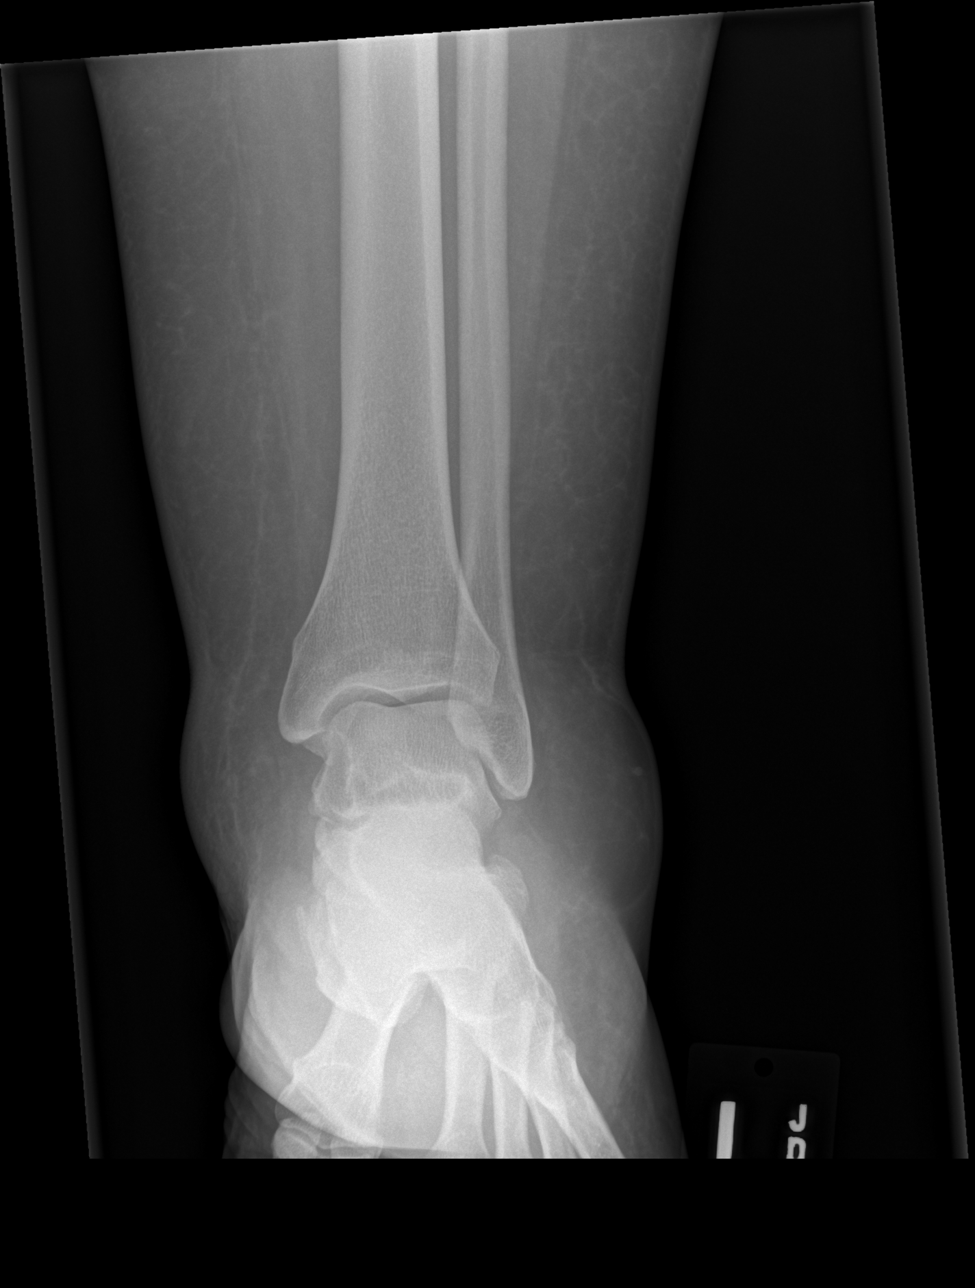

[x ankle obl left]
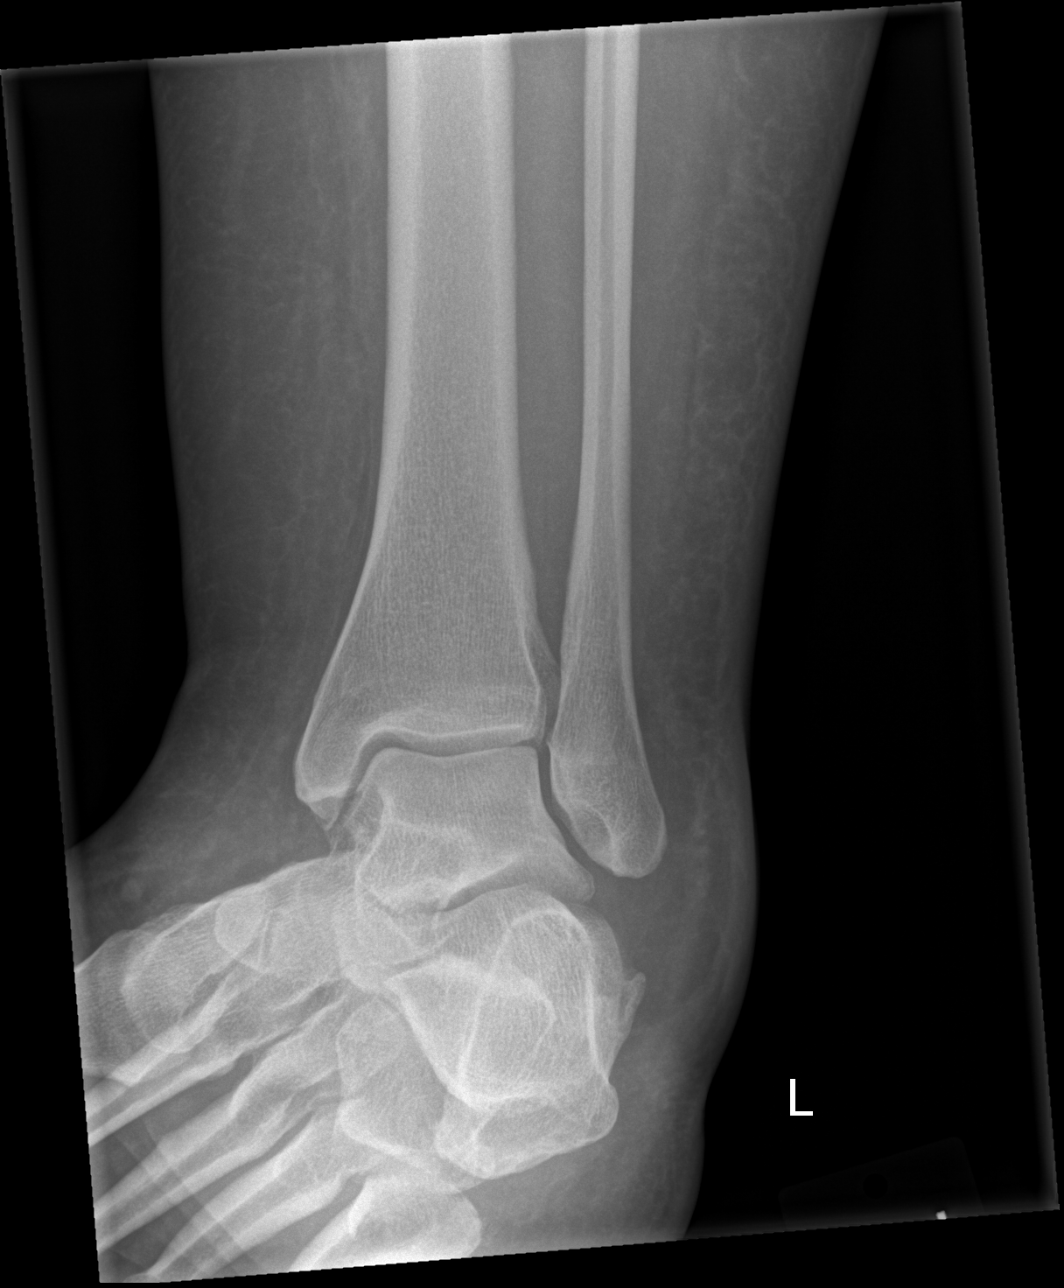

[x ankle lat left]
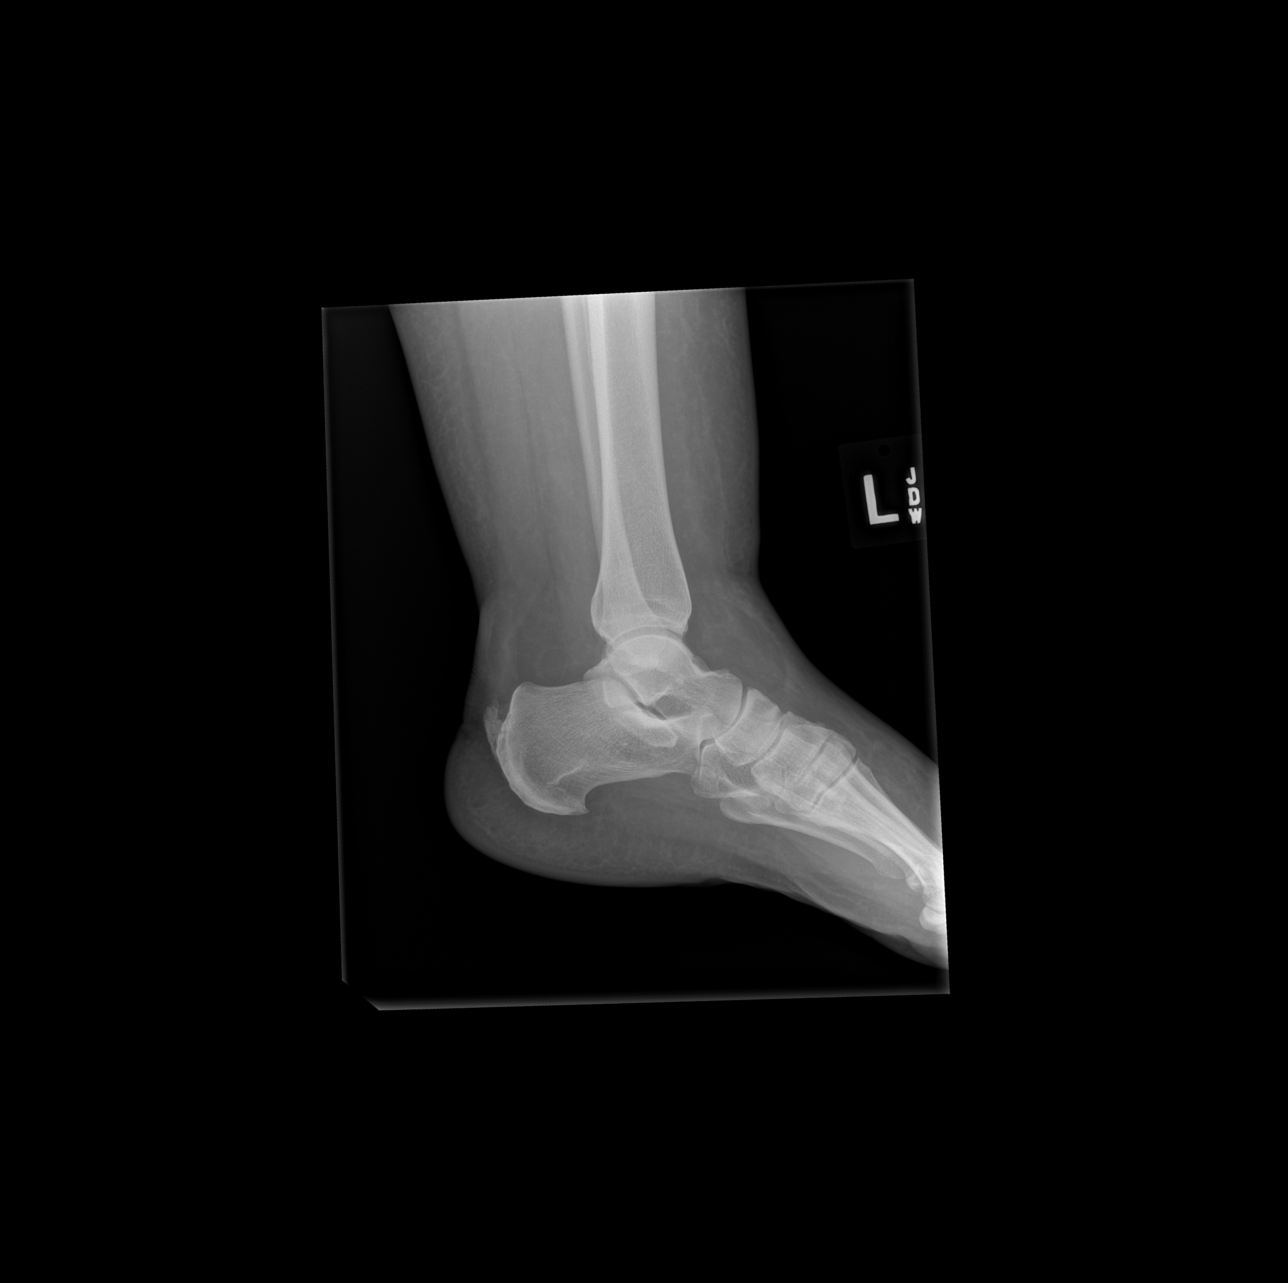

[3 of 3 positions shown; findings below may reference images not displayed]

FINDINGS: There is no evidence of fracture, dislocation, or joint effusion.
There is no evidence of arthropathy or other focal bone abnormality.
Soft tissue swelling in the medial and lateral malleolar regions.
IMPRESSION: No acute osseous abnormalities. Soft tissue swelling is appreciated
in the medial and lateral malleolar regions.

## 2016-02-24 IMAGING — CR DG FOOT 2V*L*
3 series · 3 of 3 positions shown · non-contrast
Comparison: None.

CLINICAL DATA: L foot/ankle pain/swelling

EXAM:
LEFT FOOT - 2 VIEW

[x foot ap left]
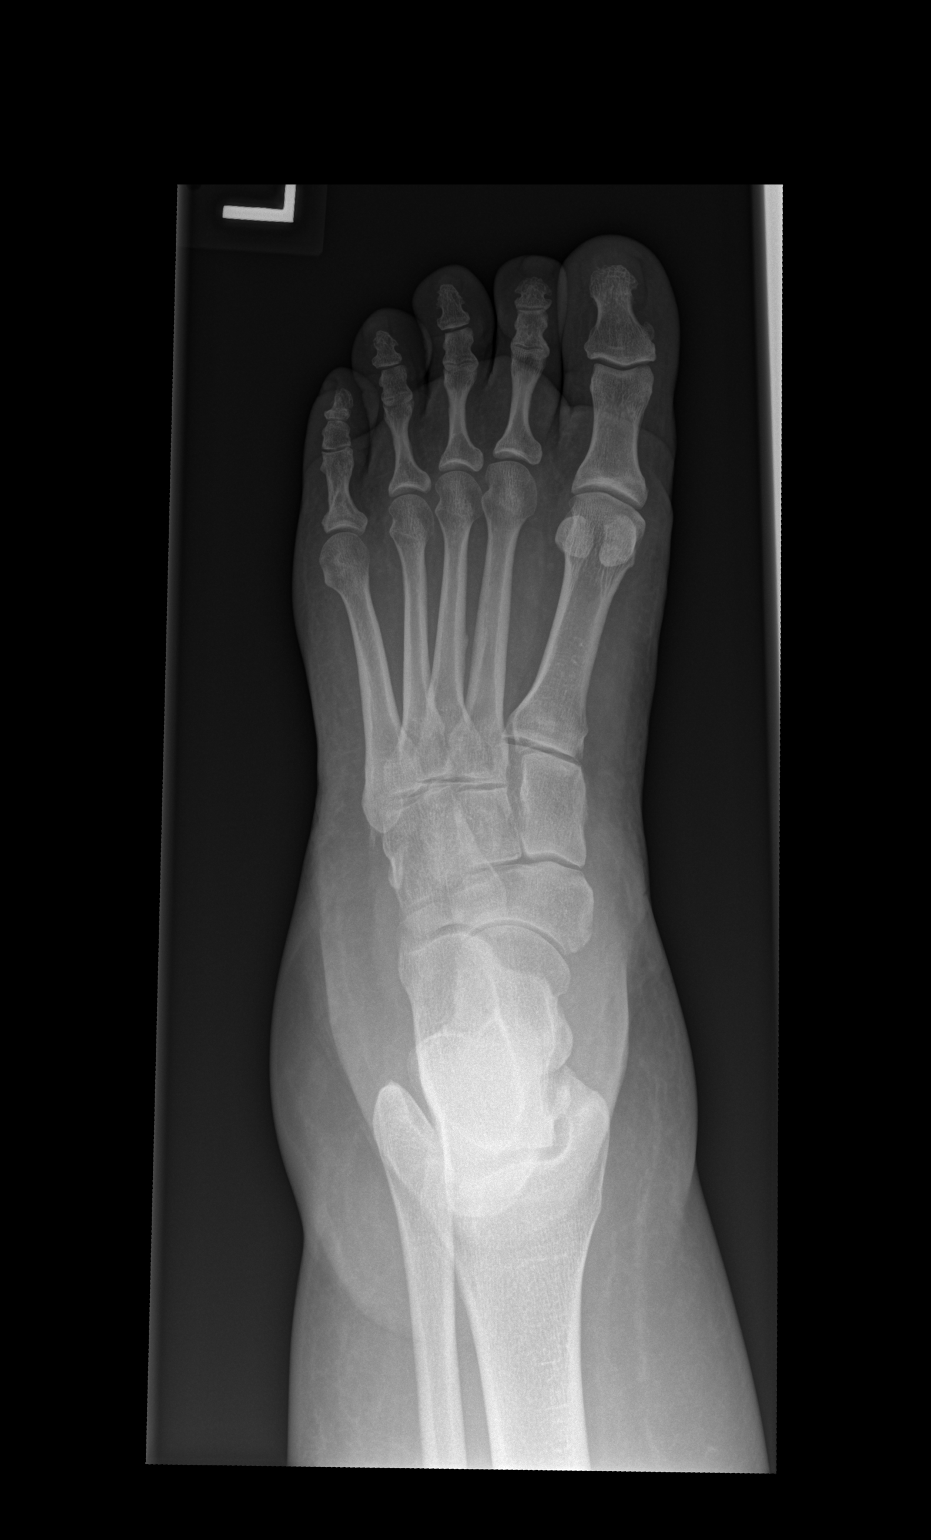

[x foot lat left (1 of 2)]
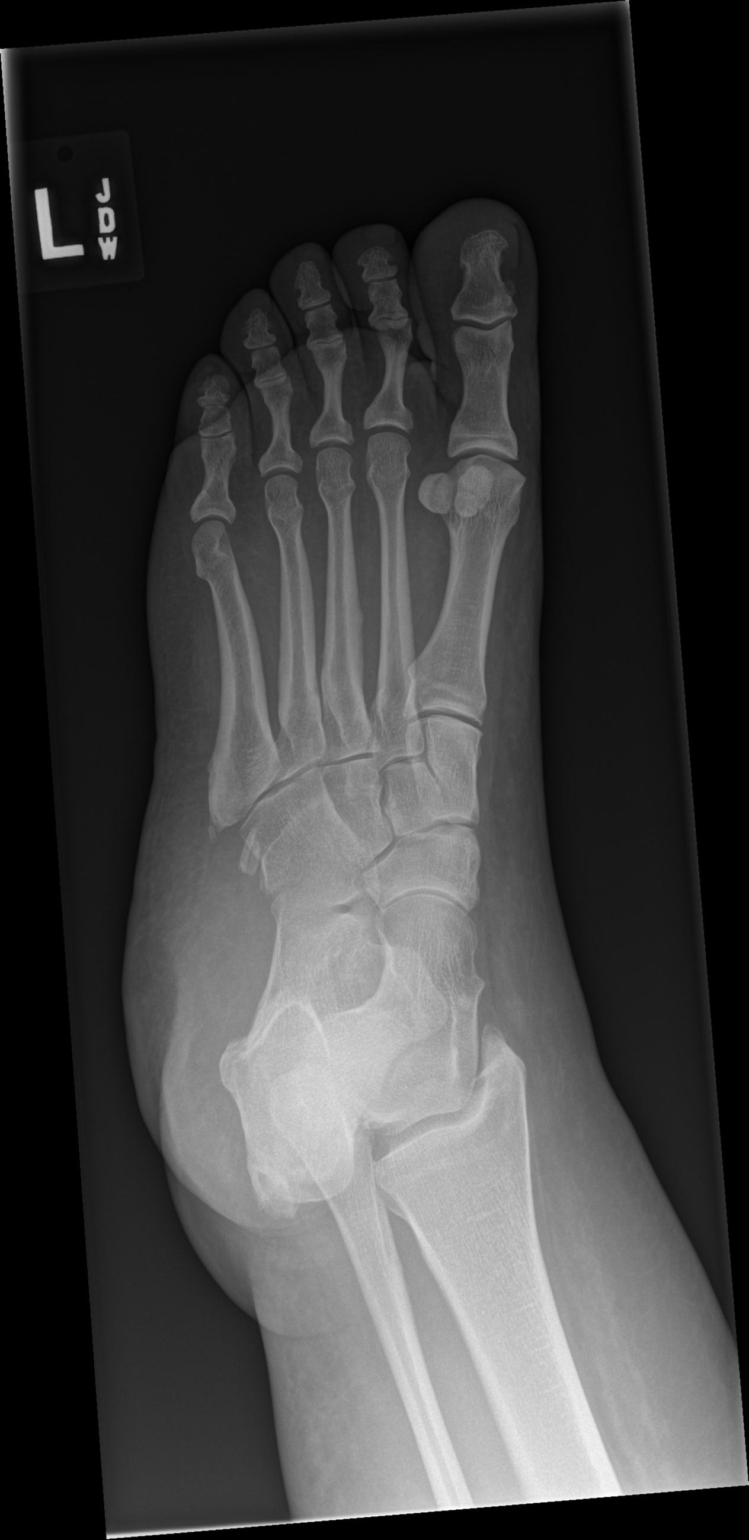

[x foot lat left (2 of 2)]
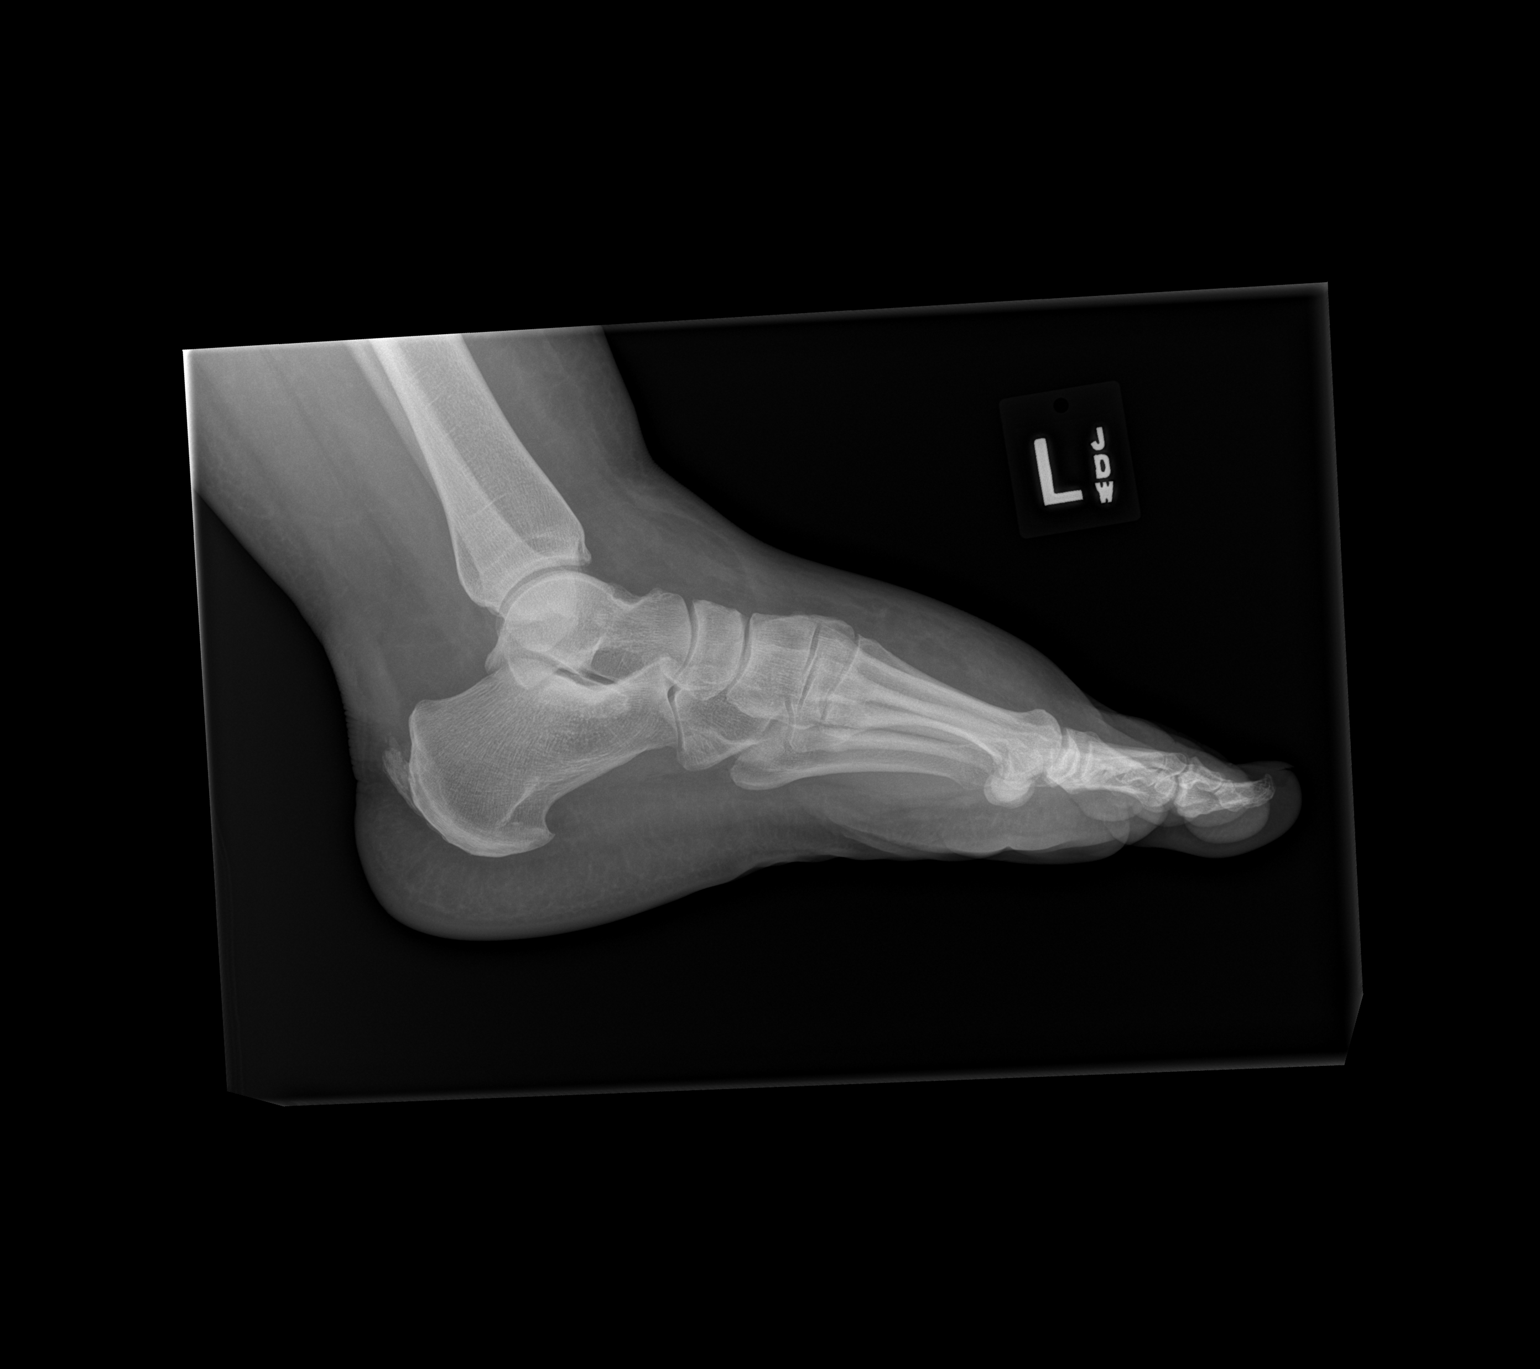

[3 of 3 positions shown; findings below may reference images not displayed]

FINDINGS: There is no evidence of fracture or dislocation. There is no
evidence of arthropathy or other focal bone abnormality. Soft
tissues are unremarkable.
IMPRESSION: Negative.

## 2016-02-25 ENCOUNTER — Telehealth: Payer: Self-pay | Admitting: *Deleted

## 2016-02-25 NOTE — Telephone Encounter (Signed)
Called regarding PA for bumetanide. The pahrmacy has to do an over ride, bumex does not require PA. Left message for [facility] to [facility] ride the bumex.

## 2016-02-26 ENCOUNTER — Emergency Department (HOSPITAL_COMMUNITY): Payer: Medicaid Other

## 2016-02-26 ENCOUNTER — Emergency Department (HOSPITAL_COMMUNITY)
Admission: EM | Admit: 2016-02-26 | Discharge: 2016-02-26 | Disposition: A | Discharge status: Home / Self Care | Payer: Medicaid Other | Attending: Emergency Medicine | Admitting: Emergency Medicine

## 2016-02-26 ENCOUNTER — Encounter (HOSPITAL_COMMUNITY): Payer: Self-pay | Admitting: Emergency Medicine

## 2016-02-26 ENCOUNTER — Encounter (HOSPITAL_COMMUNITY): Payer: Self-pay | Admitting: Family Medicine

## 2016-02-26 DIAGNOSIS — I509 Heart failure, unspecified: Secondary | ICD-10-CM | POA: Diagnosis not present

## 2016-02-26 DIAGNOSIS — J45909 Unspecified asthma, uncomplicated: Secondary | ICD-10-CM | POA: Diagnosis not present

## 2016-02-26 DIAGNOSIS — Z87891 Personal history of nicotine dependence: Secondary | ICD-10-CM | POA: Insufficient documentation

## 2016-02-26 DIAGNOSIS — Z5321 Procedure and treatment not carried out due to patient leaving prior to being seen by health care provider: Secondary | ICD-10-CM | POA: Insufficient documentation

## 2016-02-26 DIAGNOSIS — Z79899 Other long term (current) drug therapy: Secondary | ICD-10-CM | POA: Insufficient documentation

## 2016-02-26 DIAGNOSIS — R0789 Other chest pain: Secondary | ICD-10-CM | POA: Diagnosis present

## 2016-02-26 DIAGNOSIS — I110 Hypertensive heart disease with heart failure: Secondary | ICD-10-CM | POA: Insufficient documentation

## 2016-02-26 DIAGNOSIS — M25561 Pain in right knee: Secondary | ICD-10-CM | POA: Insufficient documentation

## 2016-02-26 DIAGNOSIS — Z791 Long term (current) use of non-steroidal anti-inflammatories (NSAID): Secondary | ICD-10-CM | POA: Insufficient documentation

## 2016-02-26 DIAGNOSIS — F319 Bipolar disorder, unspecified: Secondary | ICD-10-CM | POA: Insufficient documentation

## 2016-02-26 LAB — CBC
HCT: 37.3 % (ref 36.0–46.0)
Hemoglobin: 12 g/dL (ref 12.0–15.0)
MCH: 28.8 pg (ref 26.0–34.0)
MCHC: 32.2 g/dL (ref 30.0–36.0)
MCV: 89.7 fL (ref 78.0–100.0)
Platelets: 276 10*3/uL (ref 150–400)
RBC: 4.16 MIL/uL (ref 3.87–5.11)
RDW: 14 % (ref 11.5–15.5)
WBC: 9.5 10*3/uL (ref 4.0–10.5)

## 2016-02-26 LAB — I-STAT TROPONIN, ED: Troponin i, poc: 0.01 ng/mL (ref 0.00–0.08)

## 2016-02-26 MED ORDER — NAPROXEN 500 MG PO TABS: 500.0000 mg | ORAL_TABLET | Freq: Two times a day (BID) | ORAL | Status: DC

## 2016-02-26 MED ORDER — HYDROCODONE-ACETAMINOPHEN 5-325 MG PO TABS: 2.0000 | ORAL_TABLET | ORAL | Status: DC | PRN

## 2016-02-26 NOTE — Discharge Instructions (Signed)
Take your medications as prescribed as have for pain relief. I recommend continuing to take your home medications for acid reflex or your taking your anti-inflammatory medication. Rest, elevate and ice your right knee for 15-20 minutes 3-4 times daily to help with pain and swelling. I recommend following up with your orthopedist in the next week if her symptoms have not improved. Return to the emergency department if symptoms worsen or new onset of fever, redness, swelling, warmth, numbness, tingling, weakness.

## 2016-02-26 NOTE — ED Notes (Signed)
Pt to ED via [facility] for L sided chest tightness.  Denies sob, nausea, and vomiting.  No other associated symptoms.

## 2016-02-26 NOTE — ED Provider Notes (Signed)
CSN: [id number]     Arrival date & time 02/26/16  [id number] History   By signing my name below, I, Lori Bean, attest that this documentation has been prepared under the direction and in the presence of [employer], [place]. Electronically Signed: Randa Bean, ED Scribe. 02/26/2016. 11:05 AM.     Chief Complaint  Patient presents with  . Knee Pain   The history is provided by the patient. No language interpreter was used.   HPI Comments: Lori Bean is a 45 y.o. female who presents to the Emergency Department complaining of worsening right knee pain onset 1 month prior. Pt states that the pain was so severe this morning that she fell due to her leg giving out from the pain. Pt denies head injury or LOC. Pt is ambulatory with a cane. She states that at baseline she does not ambulate with a cane. Pt reports a patella dislocation about 1 month prior, she states that she was not medically evaluated at the time for her symptoms But reports having distortion of her E. She states that the pain is worse when ambulating or bearing weight. Pt states she has been taking ibuprofen with no relief. Pt denies fever, swelling, numbness or tingling. Pt does report Hx of knee pain several years prior.   Past Medical History  Diagnosis Date  . Asthma   . Hypertension   . Anxiety   . Snoring disorder     Pt stated my boyfriend always wakes me up and tells me to breathe  . Heart murmur   . CHF (congestive heart failure) ([place])   . Shortness of breath   . Bronchitis     hx of  . Bipolar affective disorder ([place])     Schizophrenia  . Depression   . GERD (gastroesophageal reflux disease)   . Arthritis   . Overactive bladder   . Schizophrenia ([place])   . Sciatica   . Anginal pain ([place])     occ from asthma   Past Surgical History  Procedure Laterality Date  . Rotator cuff repair      Right shoulder  . Tubal ligation    . Gallstones reomved    . Lumbar laminectomy/decompression  microdiscectomy  04/01/2012    Procedure: LUMBAR LAMINECTOMY/DECOMPRESSION MICRODISCECTOMY 2 LEVELS;  Surgeon: Faythe Ghee, MD;  Location: [facility];  Service: Neurosurgery;  Laterality: Left;  Lumbar four-five, lumbar five sacral one microdiscectomy   . Lumbar wound debridement  04/29/2012    Procedure: LUMBAR WOUND DEBRIDEMENT;  Surgeon: Faythe Ghee, MD;  Location: [facility];  Service: Neurosurgery;  Laterality: N/A;  lumbar wound debridement  . Cholecystectomy    . Eye surgery      Metal plate in right eye  . Back surgery      3 back surgeries  . Colonoscopy with propofol N/A 01/23/2016    Procedure: COLONOSCOPY WITH PROPOFOL;  Surgeon: Wonda Horner, MD;  Location: [facility];  Service: Endoscopy;  Laterality: N/A;   Family History  Problem Relation Age of Onset  . Diabetes Mother   . Diabetes Father   . Heart disease Paternal Aunt   . Cancer Paternal Aunt    Social History  Substance Use Topics  . Smoking status: Former Smoker -- 0.25 packs/day for 9 years    Types: Cigarettes    Quit date: 05/23/2014  . Smokeless tobacco: Never Used  . Alcohol Use: No     Comment: quit Nov. 2014  OB History    No data available     Review of Systems  Constitutional: Negative for fever.  Musculoskeletal: Positive for arthralgias. Negative for joint swelling.  Neurological: Negative for numbness.      Allergies  Aspirin  Home Medications   Prior to Admission medications   Medication Sig Start Date End Date Taking? Authorizing Provider  albuterol (PROAIR HFA) 108 (90 Base) MCG/ACT inhaler INHALE TWO PUFFS INTO THE LUNGS EVERY SIX HOURS AS NEEDED FOR WHEEZING OR SHORTNESS OF BREATH Patient taking differently: Inhale 2 puffs into the lungs every 6 (six) hours as needed for wheezing or shortness of breath.  11/07/15   Lance Bosch, NP  amLODipine (NORVASC) 10 MG tablet TAKE ONE (1) TABLET BY MOUTH EVERY DAY Patient taking differently: Take 10 mg by mouth daily.   03/04/15   Lance Bosch, NP  ARIPiprazole (ABILIFY) 20 MG tablet Take 1 tablet (20 mg total) by mouth at bedtime. 03/04/15   Lance Bosch, NP  bumetanide (BUMEX) 2 MG tablet Take 1 tablet (2 mg total) by mouth daily. 05/24/15   [name], MD  chlorhexidine (PERIDEX) 0.12 % solution Use as directed 15 mLs in the mouth or throat 3 (three) times daily. Patient taking differently: Use as directed 15 mLs in the mouth or throat daily as needed (mouth pain).  07/26/15   Billy Fischer, MD  gabapentin (NEURONTIN) 300 MG capsule TAKE ONE (1) CAPSULE BY MOUTH 2 TIMES DAILY 01/22/15   Lance Bosch, NP  HYDROcodone-acetaminophen (NORCO/VICODIN) 5-325 MG tablet Take 2 tablets by mouth every 4 (four) hours as needed. 02/26/16   Nona Dell, PA-C  naproxen (NAPROSYN) 500 MG tablet Take 1 tablet (500 mg total) by mouth 2 (two) times daily. 02/26/16   Nona Dell, PA-C  omeprazole (PRILOSEC) 20 MG capsule Take 1 capsule (20 mg total) by mouth daily. 03/04/15   Lance Bosch, NP  potassium chloride 20 MEQ TBCR Take 20 mEq by mouth 2 (two) times daily. 05/26/15   Eileen Stanford, PA-C  triamcinolone cream (KENALOG) 0.1 % Apply 1 application topically 2 (two) times daily as needed (foot rash). 11/07/15   Lance Bosch, NP   BP 121/100 mmHg  Pulse 66  Temp(Src) 97.3 F (36.3 C) (Oral)  Resp 16  SpO2 100%   Physical Exam  Constitutional: She is oriented to person, place, and time. She appears well-developed and well-nourished.  HENT:  Head: Normocephalic and atraumatic.  Eyes: Conjunctivae and EOM are normal. Right eye exhibits no discharge. Left eye exhibits no discharge. No scleral icterus.  Neck: Normal range of motion. Neck supple.  Cardiovascular: Normal rate.   Pulmonary/Chest: Effort normal. No respiratory distress.  Musculoskeletal: Normal range of motion. She exhibits tenderness.       Right knee: She exhibits normal range of motion, no swelling, no effusion, no  ecchymosis, no deformity, no laceration, no erythema, normal alignment, no LCL laxity, normal patellar mobility and no MCL laxity. Tenderness found. Medial joint line and patellar tendon tenderness noted.  Palpation over right anterior knee, 5 out of 5 strength. 2+ PT pulses. Sensation grossly intact. Patient able to stand and ambulate with cane but endorses pain. No redness, warmth, erythema, abrasion, contusion, laceration or swelling.  Neurological: She is alert and oriented to person, place, and time.  Skin: Skin is warm and dry.  Nursing note and vitals reviewed.   ED Course  Procedures (including critical care time) DIAGNOSTIC STUDIES: Oxygen Saturation  is 100% on RA, normal by my interpretation.    COORDINATION OF CARE: 11:05 AM-Discussed treatment plan with pt at bedside and pt agreed to plan.     Labs Review Labs Reviewed - No data to display  Imaging Review Dg Knee Complete 4 Views Right  02/26/2016  CLINICAL DATA:  Right knee pain and swelling after patellar dislocation 1 month ago. EXAM: RIGHT KNEE - COMPLETE 4+ VIEW COMPARISON:  Radiograph of March 02, 2009. FINDINGS: No evidence of fracture, dislocation, or joint effusion. Spurring of superior aspect of patella is noted. Joint spaces are intact. Mild osteophyte formation is noted medially. Soft tissues are unremarkable. IMPRESSION: Mild degenerative changes as described above. No acute abnormality seen in the right knee. Electronically Signed   By: Marijo Conception, M.D.   On: 02/26/2016 10:27   I have personally reviewed and evaluated these images as part of my medical decision-making.   EKG Interpretation None      MDM   Final diagnoses:  Right knee pain   Patient presents with right knee pain, denies any known injury or trauma. VSS. Exam revealed tenderness to right anterior knee, right knee exam otherwise unremarkable. Right lower extremity neurovascularly intact. Right knee x-ray revealed mild degenerative  changes, no acute abnormality. I suspect patient's symptoms are likely due to degenerative changes seen on x-ray. Discussed results and plan for discharge with patient. Plan to discharge patient home with NSAIDs, pain meds and symptomatically treatment. Advised patient to follow up with her orthopedist as needed. Discussed return precautions with patient.  I personally performed the services described in this documentation, which was scribed in my presence. The recorded information has been reviewed and is accurate.      Chesley Noon [place], [place] 02/26/16 [place], MD 03/03/16 [phone]

## 2016-02-26 NOTE — ED Notes (Signed)
Pt here for right knee pain and swelling. sts has ben going on for a while and getting worse.

## 2016-02-26 NOTE — ED Notes (Signed)
Pt is in stable condition upon d/c and ambulates from ED. 

## 2016-02-27 ENCOUNTER — Emergency Department (HOSPITAL_COMMUNITY)
Admission: EM | Admit: 2016-02-27 | Discharge: 2016-02-27 | Disposition: A | Discharge status: Home / Self Care | Payer: Medicaid Other | Attending: Dermatology | Admitting: Dermatology

## 2016-02-27 LAB — BASIC METABOLIC PANEL WITH GFR
Anion gap: 7 (ref 5–15)
BUN: 18 mg/dL (ref 6–20)
CO2: 34 mmol/L — ABNORMAL HIGH (ref 22–32)
Calcium: 8.6 mg/dL — ABNORMAL LOW (ref 8.9–10.3)
Chloride: 98 mmol/L — ABNORMAL LOW (ref 101–111)
Creatinine, Ser: 0.95 mg/dL (ref 0.44–1.00)
GFR calc Af Amer: >60 mL/min
GFR calc non Af Amer: >60 mL/min
Glucose, Bld: 100 mg/dL — ABNORMAL HIGH (ref 65–99)
Potassium: 3.2 mmol/L — ABNORMAL LOW (ref 3.5–5.1)
Sodium: 139 mmol/L (ref 135–145)

## 2016-02-27 NOTE — ED Notes (Signed)
Called Pt to reassess at 0221, no response.

## 2016-02-27 NOTE — ED Notes (Signed)
No answer when called not in waiting room

## 2016-03-09 ENCOUNTER — Encounter (HOSPITAL_COMMUNITY): Payer: Self-pay | Admitting: Emergency Medicine

## 2016-03-09 ENCOUNTER — Emergency Department (HOSPITAL_COMMUNITY)
Admission: EM | Admit: 2016-03-09 | Discharge: 2016-03-09 | Disposition: A | Discharge status: Home / Self Care | Payer: Medicaid Other | Attending: Emergency Medicine | Admitting: Emergency Medicine

## 2016-03-09 DIAGNOSIS — M25561 Pain in right knee: Secondary | ICD-10-CM | POA: Diagnosis not present

## 2016-03-09 DIAGNOSIS — J45909 Unspecified asthma, uncomplicated: Secondary | ICD-10-CM | POA: Insufficient documentation

## 2016-03-09 DIAGNOSIS — I509 Heart failure, unspecified: Secondary | ICD-10-CM | POA: Diagnosis not present

## 2016-03-09 DIAGNOSIS — Z87891 Personal history of nicotine dependence: Secondary | ICD-10-CM | POA: Diagnosis not present

## 2016-03-09 DIAGNOSIS — I110 Hypertensive heart disease with heart failure: Secondary | ICD-10-CM | POA: Insufficient documentation

## 2016-03-09 DIAGNOSIS — Z79899 Other long term (current) drug therapy: Secondary | ICD-10-CM | POA: Diagnosis not present

## 2016-03-09 MED ORDER — MELOXICAM 7.5 MG PO TABS: 15.0000 mg | ORAL_TABLET | Freq: Every day | ORAL | Status: DC

## 2016-03-09 NOTE — ED Notes (Addendum)
Rt knee pain x 1 month Has seen a dr for this was given referral but her dr is no longer with [facility],  She is in pain and cannot get into see dr

## 2016-03-09 NOTE — ED Notes (Signed)
Declined W/C at D/C and was escorted to lobby by RN. 

## 2016-03-09 NOTE — Discharge Instructions (Signed)
Take the prescribed medication as directed.  Do not take this with the naprosyn given to you from ED last week. Follow-up with the orthopedist as soon as your referral is processed from your primary care office. Return to the ED for new or worsening symptoms.

## 2016-03-09 NOTE — ED Provider Notes (Signed)
CSN: [id number]     Arrival date & time 03/09/16  [id number] History  By signing my name below, I, Lori Bean, attest that this documentation has been prepared under the direction and in the presence of  [name], PA-C. Electronically Signed: [name], ED Scribe. 03/09/2016. 11:07 AM.    Chief Complaint  Patient presents with  . Knee Pain   The history is provided by the patient. No language interpreter was used.   HPI Comments: [name] is a 45 y.o. female with a hx of HTN and CHF, who presents to the Emergency Department complaining of gradually worsening moderate right knee pain onset one month ago. Pt was seen on 02/26/16 where an x-ray revealed mild degenerative changes. She reports that she received a referral for Ortho but her PCP at the [facility] has moved and she will have to wait 2 months for an appointment. She denies any new injuries or falls since then. No alleviating factors noted. Pt denies trying a knee brace or crutches. She states that she no longer has her cane and has been limping everywhere. No rash or any open wounds.   Past Medical History  Diagnosis Date  . Asthma   . Hypertension   . Anxiety   . Snoring disorder     Pt stated my boyfriend always wakes me up and tells me to breathe  . Heart murmur   . CHF (congestive heart failure) ([place])   . Shortness of breath   . Bronchitis     hx of  . Bipolar affective disorder ([place])     Schizophrenia  . Depression   . GERD (gastroesophageal reflux disease)   . Arthritis   . Overactive bladder   . Schizophrenia ([place])   . Sciatica   . Anginal pain ([place])     occ from asthma   Past Surgical History  Procedure Laterality Date  . Rotator cuff repair      Right shoulder  . Tubal ligation    . Gallstones reomved    . Lumbar laminectomy/decompression microdiscectomy  04/01/2012    Procedure: LUMBAR LAMINECTOMY/DECOMPRESSION MICRODISCECTOMY 2 LEVELS;  Surgeon: Faythe Ghee, MD;   Location: [facility];  Service: Neurosurgery;  Laterality: Left;  Lumbar four-five, lumbar five sacral one microdiscectomy   . Lumbar wound debridement  04/29/2012    Procedure: LUMBAR WOUND DEBRIDEMENT;  Surgeon: Faythe Ghee, MD;  Location: [facility];  Service: Neurosurgery;  Laterality: N/A;  lumbar wound debridement  . Cholecystectomy    . Eye surgery      Metal plate in right eye  . Back surgery      3 back surgeries  . Colonoscopy with propofol N/A 01/23/2016    Procedure: COLONOSCOPY WITH PROPOFOL;  Surgeon: Wonda Horner, MD;  Location: [facility];  Service: Endoscopy;  Laterality: N/A;   Family History  Problem Relation Age of Onset  . Diabetes Mother   . Diabetes Father   . Heart disease Paternal Aunt   . Cancer Paternal Aunt    Social History  Substance Use Topics  . Smoking status: Former Smoker -- 0.25 packs/day for 9 years    Types: Cigarettes    Quit date: 05/23/2014  . Smokeless tobacco: Never Used  . Alcohol Use: No     Comment: quit Nov. 2014   OB History    No data available     Review of Systems  Constitutional: Negative for fever.  Musculoskeletal: Positive  for arthralgias.  Skin: Negative for rash and wound.  All other systems reviewed and are negative.     Allergies  Aspirin  Home Medications   Prior to Admission medications   Medication Sig Start Date End Date Taking? Authorizing Provider  albuterol (PROAIR HFA) 108 (90 Base) MCG/ACT inhaler INHALE TWO PUFFS INTO THE LUNGS EVERY SIX HOURS AS NEEDED FOR WHEEZING OR SHORTNESS OF BREATH Patient taking differently: Inhale 2 puffs into the lungs every 6 (six) hours as needed for wheezing or shortness of breath.  11/07/15   Lance Bosch, NP  amLODipine (NORVASC) 10 MG tablet TAKE ONE (1) TABLET BY MOUTH EVERY DAY Patient taking differently: Take 10 mg by mouth daily.  03/04/15   Lance Bosch, NP  ARIPiprazole (ABILIFY) 20 MG tablet Take 1 tablet (20 mg total) by mouth at bedtime. 03/04/15    Lance Bosch, NP  bumetanide (BUMEX) 2 MG tablet Take 1 tablet (2 mg total) by mouth daily. 05/24/15   [name], MD  chlorhexidine (PERIDEX) 0.12 % solution Use as directed 15 mLs in the mouth or throat 3 (three) times daily. Patient taking differently: Use as directed 15 mLs in the mouth or throat daily as needed (mouth pain).  07/26/15   Billy Fischer, MD  gabapentin (NEURONTIN) 300 MG capsule TAKE ONE (1) CAPSULE BY MOUTH 2 TIMES DAILY 01/22/15   Lance Bosch, NP  HYDROcodone-acetaminophen (NORCO/VICODIN) 5-325 MG tablet Take 2 tablets by mouth every 4 (four) hours as needed. 02/26/16   Nona Dell, PA-C  naproxen (NAPROSYN) 500 MG tablet Take 1 tablet (500 mg total) by mouth 2 (two) times daily. 02/26/16   Nona Dell, PA-C  omeprazole (PRILOSEC) 20 MG capsule Take 1 capsule (20 mg total) by mouth daily. 03/04/15   Lance Bosch, NP  potassium chloride 20 MEQ TBCR Take 20 mEq by mouth 2 (two) times daily. 05/26/15   Eileen Stanford, PA-C  triamcinolone cream (KENALOG) 0.1 % Apply 1 application topically 2 (two) times daily as needed (foot rash). 11/07/15   Lance Bosch, NP   BP 151/91 mmHg  Pulse 70  Temp(Src) 98.4 F (36.9 C) (Oral)  Resp 16  SpO2 99%  LMP 02/20/2016   Physical Exam  Constitutional: She is oriented to person, place, and time. She appears well-developed and well-nourished.  obese  HENT:  Head: Normocephalic and atraumatic.  Mouth/Throat: Oropharynx is clear and moist.  Eyes: Conjunctivae and EOM are normal. Pupils are equal, round, and reactive to light.  Neck: Normal range of motion.  Cardiovascular: Normal rate, regular rhythm and normal heart sounds.   Pulmonary/Chest: Effort normal and breath sounds normal.  Abdominal: Soft. Bowel sounds are normal.  Musculoskeletal: Normal range of motion.  Right knee with TTP along medial joint line; no bony deformities or swelling, no overlying skin changes or warmth to touch, full  flexion/extension maintained; DP pulse intact, normal sensation throughout right leg limping gait favoring right leg  Neurological: She is alert and oriented to person, place, and time.  Skin: Skin is warm and dry.  Psychiatric: She has a normal mood and affect.  Nursing note and vitals reviewed.   ED Course  Procedures (including critical care time) DIAGNOSTIC STUDIES: Oxygen Saturation is 99% on RA, normal by my interpretation.    COORDINATION OF CARE: 10:53 AM- Pt advised of plan for treatment and pt agrees. Pt will receive ace wrap and crutches.   MDM   Final diagnoses:  Right knee pain   45 year old female here with continued right knee pain. Seen in the ED for this approximately 2 weeks ago. She has been referred to orthopedics but cannot be seen by PCP for 2 months for prior authorization. Exam today with tenderness along the medial joint line without swelling or bony deformity. She has no overlying skin changes or warmth to touch. Her leg is neurovascularly intact. She has a limping gait as she has lost her cane. Do not suspect septic joint. Suspect this is sequela of degenerative changes seen on her x-ray from prior ED visit.  Do not feel further emergent imaging indicated at this time.  Ace wrap applied, crutches to help with ambulation. Follow-up with orthopedics when able.  Switch from naprosyn to mobic as she has not had any relief with former-- advised not to take both simultaneously.  Discussed plan with patient, he/she acknowledged understanding and agreed with plan of care.  Return precautions given for new or worsening symptoms.  I personally performed the services described in this documentation, which was scribed in my presence. The recorded information has been reviewed and is accurate.  Larene Pickett, PA-C 03/09/16 1137  Elnora Morrison, MD 03/09/16 [phone]
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
## 2016-03-31 ENCOUNTER — Other Ambulatory Visit: Payer: Self-pay | Admitting: Internal Medicine

## 2016-04-07 ENCOUNTER — Encounter (HOSPITAL_COMMUNITY): Payer: Self-pay | Admitting: Vascular Surgery

## 2016-04-07 ENCOUNTER — Emergency Department (HOSPITAL_COMMUNITY)
Admission: EM | Admit: 2016-04-07 | Discharge: 2016-04-07 | Disposition: A | Discharge status: Home / Self Care | Payer: Medicaid Other | Attending: Emergency Medicine | Admitting: Emergency Medicine

## 2016-04-07 DIAGNOSIS — I509 Heart failure, unspecified: Secondary | ICD-10-CM | POA: Diagnosis not present

## 2016-04-07 DIAGNOSIS — Z87891 Personal history of nicotine dependence: Secondary | ICD-10-CM | POA: Diagnosis not present

## 2016-04-07 DIAGNOSIS — Z79899 Other long term (current) drug therapy: Secondary | ICD-10-CM | POA: Diagnosis not present

## 2016-04-07 DIAGNOSIS — I110 Hypertensive heart disease with heart failure: Secondary | ICD-10-CM | POA: Insufficient documentation

## 2016-04-07 DIAGNOSIS — J45909 Unspecified asthma, uncomplicated: Secondary | ICD-10-CM | POA: Diagnosis not present

## 2016-04-07 DIAGNOSIS — M25561 Pain in right knee: Secondary | ICD-10-CM | POA: Insufficient documentation

## 2016-04-07 MED ORDER — IBUPROFEN 600 MG PO TABS: 600.0000 mg | ORAL_TABLET | Freq: Four times a day (QID) | ORAL | 0 refills | Status: DC | PRN

## 2016-04-07 NOTE — ED Notes (Signed)
Pt states she does not want to wait for social worker, "I gotta go".  PA notified. Will get d/c papers ready.

## 2016-04-07 NOTE — Discharge Planning (Signed)
Consulted by patients provider R.Browning regarding follow up care. Follow up appointment made with the [facility] for Aug 9,2017 @ 10:30am, pt verbalized understanding of the upcoming appointment. My contact information provided for any future questions or concerns. No other [facility] needs identified at this time.  [id number] [phone]

## 2016-04-07 NOTE — ED Notes (Signed)
Waiting for social work consult.

## 2016-04-07 NOTE — ED Triage Notes (Signed)
Pt reports to the ED for eval of right knee pain x 1 month. She was seen here and referred to the [facility] but she cannot get an appointment. Pt denies any recent injury. Pt A&Ox4, resp e/u, and skin warm and dry.

## 2016-04-07 NOTE — ED Provider Notes (Signed)
[facility] DEPT Provider Note   CSN: [id number] Arrival date & time: 04/07/16  [id number]  First Provider Contact: First MD initiated contact with patient 04/07/16 9:38 AM.     By signing my name below, I, Hansel Feinstein, attest that this documentation has been prepared under the direction and in the presence of Montine Circle, PA-C. Electronically Signed: Hansel Feinstein, ED Scribe. 04/07/16. 9:43 AM.    History   Chief Complaint Chief Complaint  Patient presents with  . Knee Pain    HPI  Lori Bean is a 45 y.o. female who presents to the Emergency Department complaining of moderate, gradually worsening, persistent right knee pain and swelling onset 2 months ago. She denies recent trauma, injury or fall. She has previously had XR in the ED for the same complaint on 02/26/16 showing mild degenerative changes. She reports that she attempted to follow up with orthopedics as recommended, but was not able to obtain a PCP appointment until the end of next month. Pt states that pain is worsened with movement, ambulation and weight-bearing. She notes that she has applied ice with temporary relief of pain. She denies weakness.   The history is provided by the patient. No language interpreter was used.    Past Medical History:  Diagnosis Date  . Anginal pain ([place])    occ from asthma  . Anxiety   . Arthritis   . Asthma   . Bipolar affective disorder ([place])    Schizophrenia  . Bronchitis    hx of  . CHF (congestive heart failure) ([place])   . Depression   . GERD (gastroesophageal reflux disease)   . Heart murmur   . Hypertension   . Overactive bladder   . Schizophrenia ([place])   . Sciatica   . Shortness of breath   . Snoring disorder    Pt stated my boyfriend always wakes me up and tells me to breathe    Patient Active Problem List   Diagnosis Date Noted  . Tobacco abuse disorder 04/03/2014  . Obesity (BMI 30-39.9) 04/03/2014  . Chest pain 04/03/2014  . Mild cardiomegaly  02/21/2014  . Lumbar spondylosis 12/28/2013  . Extrinsic asthma, unspecified 06/07/2013  . Anxiety   . Hypertension   . Bipolar affective disorder ([place])   . GERD (gastroesophageal reflux disease)   . Arthritis   . Schizophrenia ([place])   . Overactive bladder     Past Surgical History:  Procedure Laterality Date  . BACK SURGERY     3 back surgeries  . CHOLECYSTECTOMY    . COLONOSCOPY WITH PROPOFOL N/A 01/23/2016   Procedure: COLONOSCOPY WITH PROPOFOL;  Surgeon: Wonda Horner, MD;  Location: [facility];  Service: Endoscopy;  Laterality: N/A;  . EYE SURGERY     Metal plate in right eye  . gallstones reomved    . LUMBAR LAMINECTOMY/DECOMPRESSION MICRODISCECTOMY  04/01/2012   Procedure: LUMBAR LAMINECTOMY/DECOMPRESSION MICRODISCECTOMY 2 LEVELS;  Surgeon: Faythe Ghee, MD;  Location: [facility];  Service: Neurosurgery;  Laterality: Left;  Lumbar four-five, lumbar five sacral one microdiscectomy   . LUMBAR WOUND DEBRIDEMENT  04/29/2012   Procedure: LUMBAR WOUND DEBRIDEMENT;  Surgeon: Faythe Ghee, MD;  Location: [facility];  Service: Neurosurgery;  Laterality: N/A;  lumbar wound debridement  . ROTATOR CUFF REPAIR     Right shoulder  . TUBAL LIGATION      OB History    No data available       Home Medications    Prior  to Admission medications   Medication Sig Start Date End Date Taking? Authorizing Provider  albuterol (PROAIR HFA) 108 (90 Base) MCG/ACT inhaler INHALE TWO PUFFS INTO THE LUNGS EVERY SIX HOURS AS NEEDED FOR WHEEZING OR SHORTNESS OF BREATH Patient taking differently: Inhale 2 puffs into the lungs every 6 (six) hours as needed for wheezing or shortness of breath.  11/07/15   Lance Bosch, NP  amLODipine (NORVASC) 10 MG tablet Take 1 tablet (10 mg total) by mouth daily. Must have office visit for refills 03/31/16   Tresa Garter, MD  ARIPiprazole (ABILIFY) 20 MG tablet Take 1 tablet (20 mg total) by mouth at bedtime. 03/04/15   Lance Bosch, NP    bumetanide (BUMEX) 2 MG tablet Take 1 tablet (2 mg total) by mouth daily. 05/24/15   [name], MD  chlorhexidine (PERIDEX) 0.12 % solution Use as directed 15 mLs in the mouth or throat 3 (three) times daily. Patient taking differently: Use as directed 15 mLs in the mouth or throat daily as needed (mouth pain).  07/26/15   Billy Fischer, MD  gabapentin (NEURONTIN) 300 MG capsule TAKE ONE (1) CAPSULE BY MOUTH 2 TIMES DAILY 01/22/15   Lance Bosch, NP  HYDROcodone-acetaminophen (NORCO/VICODIN) 5-325 MG tablet Take 2 tablets by mouth every 4 (four) hours as needed. 02/26/16   Nona Dell, PA-C  meloxicam (MOBIC) 7.5 MG tablet Take 2 tablets (15 mg total) by mouth daily. 03/09/16   Larene Pickett, PA-C  omeprazole (PRILOSEC) 20 MG capsule Take 1 capsule (20 mg total) by mouth daily. 03/04/15   Lance Bosch, NP  potassium chloride 20 MEQ TBCR Take 20 mEq by mouth 2 (two) times daily. 05/26/15   Eileen Stanford, PA-C  triamcinolone cream (KENALOG) 0.1 % Apply 1 application topically 2 (two) times daily as needed (foot rash). 11/07/15   Lance Bosch, NP    Family History Family History  Problem Relation Age of Onset  . Diabetes Mother   . Diabetes Father   . Heart disease Paternal Aunt   . Cancer Paternal Aunt     Social History Social History  Substance Use Topics  . Smoking status: Former Smoker    Packs/day: 0.25    Years: 9.00    Types: Cigarettes    Quit date: 05/23/2014  . Smokeless tobacco: Never Used  . Alcohol use No     Comment: quit Nov. 2014     Allergies   Aspirin   Review of Systems Review of Systems  Musculoskeletal: Positive for arthralgias (right knee) and joint swelling (right knee).  Neurological: Negative for weakness.     Physical Exam Updated Vital Signs BP 148/82 (BP Location: Right Arm)   Pulse 83   Temp 98 F (36.7 C) (Oral)   Resp 16   SpO2 97%   Physical Exam Physical Exam  Constitutional: Pt appears well-developed and  well-nourished. No distress.  HENT:  Head: Normocephalic and atraumatic.  Eyes: Conjunctivae are normal.  Neck: Normal range of motion.  Cardiovascular: Normal rate, regular rhythm and intact distal pulses.   Capillary refill < 3 sec  Pulmonary/Chest: Effort normal and breath sounds normal.  Musculoskeletal: Pt exhibits tenderness to medial right knee. Pt exhibits no edema.  ROM: 5/5  Neurological: Pt  is alert. Coordination normal. Ambulatory.  Sensation 5/5 Strength 5/5  Skin: Skin is warm and dry. Pt is not diaphoretic.  No tenting of the skin  Psychiatric: Pt has a normal mood  and affect.  Nursing note and vitals reviewed.   ED Treatments / Results  Labs (all labs ordered are listed, but only abnormal results are displayed) Labs Reviewed - No data to display  EKG  EKG Interpretation None       Radiology No results found.  Procedures Procedures (including critical care time) DIAGNOSTIC STUDIES: Oxygen Saturation is 97% on RA, normal by my interpretation.    COORDINATION OF CARE: 9:40 AM Discussed treatment plan with pt at bedside which includes social work consult, orthopedic follow up and pt agreed to plan.    Medications Ordered in ED Medications - No data to display   Initial Impression / Assessment and Plan / ED Course  I have reviewed the triage vital signs and the nursing notes.  Pertinent labs & imaging results that were available during my care of the patient were reviewed by me and considered in my medical decision making (see chart for details).  Clinical Course    Right knee pain x months.  Ambulatory.  Told she needs meniscal repair.  Will give knee brace and ortho follow-up.  Final Clinical Impressions(s) / ED Diagnoses   Final diagnoses:  Right knee pain    New Prescriptions New Prescriptions   No medications on file    I personally performed the services described in this documentation, which was scribed in my presence. The recorded  information has been reviewed and is accurate.      Montine Circle, PA-C 04/07/16 [place], MD 04/11/16 1500

## 2016-04-07 NOTE — Progress Notes (Signed)
Orthopedic Tech Progress Note Patient Details:  Lori Bean [birth year] [id number]  Ortho Devices Type of Ortho Device: Knee Immobilizer Ortho Device/Splint Interventions: Veleta Miners 04/07/2016, 10:39 AM

## 2016-04-16 ENCOUNTER — Emergency Department (HOSPITAL_COMMUNITY): Payer: Medicaid Other

## 2016-04-16 ENCOUNTER — Encounter (HOSPITAL_COMMUNITY): Payer: Self-pay | Admitting: Emergency Medicine

## 2016-04-16 ENCOUNTER — Emergency Department (HOSPITAL_COMMUNITY)
Admission: EM | Admit: 2016-04-16 | Discharge: 2016-04-17 | Disposition: A | Discharge status: Home / Self Care | Payer: Medicaid Other | Attending: Emergency Medicine | Admitting: Emergency Medicine

## 2016-04-16 DIAGNOSIS — J029 Acute pharyngitis, unspecified: Secondary | ICD-10-CM | POA: Insufficient documentation

## 2016-04-16 DIAGNOSIS — J45909 Unspecified asthma, uncomplicated: Secondary | ICD-10-CM | POA: Diagnosis not present

## 2016-04-16 DIAGNOSIS — I509 Heart failure, unspecified: Secondary | ICD-10-CM | POA: Insufficient documentation

## 2016-04-16 DIAGNOSIS — Z87891 Personal history of nicotine dependence: Secondary | ICD-10-CM | POA: Diagnosis not present

## 2016-04-16 DIAGNOSIS — I110 Hypertensive heart disease with heart failure: Secondary | ICD-10-CM | POA: Diagnosis not present

## 2016-04-16 DIAGNOSIS — R0789 Other chest pain: Secondary | ICD-10-CM | POA: Diagnosis not present

## 2016-04-16 DIAGNOSIS — R079 Chest pain, unspecified: Secondary | ICD-10-CM | POA: Diagnosis present

## 2016-04-16 LAB — BASIC METABOLIC PANEL
Anion gap: 9 (ref 5–15)
BUN: 15 mg/dL (ref 6–20)
CO2: 25 mmol/L (ref 22–32)
Calcium: 9.9 mg/dL (ref 8.9–10.3)
Chloride: 100 mmol/L — ABNORMAL LOW (ref 101–111)
Creatinine, Ser: 0.85 mg/dL (ref 0.44–1.00)
GFR calc Af Amer: >60 mL/min (ref >60)
GFR calc non Af Amer: >60 mL/min (ref >60)
Glucose, Bld: 116 mg/dL — ABNORMAL HIGH (ref 65–99)
Potassium: 3.1 mmol/L — ABNORMAL LOW (ref 3.5–5.1)
Sodium: 134 mmol/L — ABNORMAL LOW (ref 135–145)

## 2016-04-16 LAB — CBC
HCT: 40.1 % (ref 36.0–46.0)
Hemoglobin: 13.4 g/dL (ref 12.0–15.0)
MCH: 30.7 pg (ref 26.0–34.0)
MCHC: 33.4 g/dL (ref 30.0–36.0)
MCV: 91.8 fL (ref 78.0–100.0)
Platelets: 272 10*3/uL (ref 150–400)
RBC: 4.37 MIL/uL (ref 3.87–5.11)
RDW: 14.2 % (ref 11.5–15.5)
WBC: 10.5 10*3/uL (ref 4.0–10.5)

## 2016-04-16 LAB — I-STAT TROPONIN, ED: Troponin i, poc: 0.02 ng/mL (ref 0.00–0.08)

## 2016-04-16 NOTE — ED Triage Notes (Signed)
The patient said she has been having neck pain about four days ago and got SOB.  She said it got worse and she starting having chest pain when she first arrived in the ED.  The paitent was able to drive herself here.  She rates her pain 10/10.

## 2016-04-17 ENCOUNTER — Emergency Department (HOSPITAL_COMMUNITY): Payer: Medicaid Other

## 2016-04-17 LAB — RAPID STREP SCREEN (MED CTR MEBANE ONLY): Streptococcus, Group A Screen (Direct): NEGATIVE

## 2016-04-17 LAB — TROPONIN I: Troponin I: <0.03 ng/mL (ref <0.03)

## 2016-04-17 MED ORDER — IPRATROPIUM-ALBUTEROL 0.5-2.5 (3) MG/3ML IN SOLN
3.0000 mL | Freq: Once | RESPIRATORY_TRACT | Status: AC
  Administered 2016-04-17: 3 mL via RESPIRATORY_TRACT
  Filled 2016-04-17: qty 3

## 2016-04-17 MED ORDER — NITROGLYCERIN 0.4 MG SL SUBL
SUBLINGUAL_TABLET | SUBLINGUAL | Status: AC
  Filled 2016-04-17: qty 1

## 2016-04-17 MED ORDER — KETOROLAC TROMETHAMINE 30 MG/ML IJ SOLN
30.0000 mg | Freq: Once | INTRAMUSCULAR | Status: AC
  Administered 2016-04-17: 30 mg via INTRAVENOUS
  Filled 2016-04-17: qty 1

## 2016-04-17 MED ORDER — IOPAMIDOL (ISOVUE-300) INJECTION 61%
INTRAVENOUS | Status: AC
  Administered 2016-04-17: 75 mL
  Filled 2016-04-17: qty 75

## 2016-04-17 NOTE — ED Provider Notes (Signed)
[facility] DEPT Provider Note   CSN: [id number] Arrival date & time: 04/16/16  2021  By signing my name below, I, Irene Pap, attest that this documentation has been prepared under the direction and in the presence of Ezequiel Essex, MD. Electronically Signed: Irene Pap, ED Scribe. 04/17/16. 12:30 AM.  First Provider Contact:  None       History   Chief Complaint Chief Complaint  Patient presents with  . Chest Pain    The patient said she has been having neck pain about four days ago and got SOB.  She said it got worse and she starting having chest pain when she first arrived in the ED.  The paitent was able to drive herself here.  She rates her pain 10/10.  [name] Neck Pain    The history is provided by the patient. No language interpreter was used.  HPI Comments: ZOEEY STUMBAUGH is a 45 y.o. female with a hx of CHF, asthma, HTN, anginal pain, and SOB who presents to the Emergency Department complaining of gradually worsening neck pain onset 4 days ago. Pt reports associated sore throat, pain with swallowing, voice change, green diarrhea onset one week ago, fatigue, and productive cough with black sputum. Pt states that she began to have sudden onset, constant, sharp, central chest pain when she arrived to the ED 4 hours ago. She reports associated SOB, nausea, and lightheadedness. She reports worsening pain with palpation of the chest. She has not tried anything for her symptoms. Pt does not currently have a PCP. She denies hx of MI or stent placement, sick contacts, fever, headache, vomiting, or abdominal pain.   Past Medical History:  Diagnosis Date  . Anginal pain ([place])    occ from asthma  . Anxiety   . Arthritis   . Asthma   . Bipolar affective disorder ([place])    Schizophrenia  . Bronchitis    hx of  . CHF (congestive heart failure) ([place])   . Depression   . GERD (gastroesophageal reflux disease)   . Heart murmur   . Hypertension   . Overactive bladder   .  Schizophrenia ([place])   . Sciatica   . Shortness of breath   . Snoring disorder    Pt stated my boyfriend always wakes me up and tells me to breathe    Patient Active Problem List   Diagnosis Date Noted  . Tobacco abuse disorder 04/03/2014  . Obesity (BMI 30-39.9) 04/03/2014  . Chest pain 04/03/2014  . Mild cardiomegaly 02/21/2014  . Lumbar spondylosis 12/28/2013  . Extrinsic asthma, unspecified 06/07/2013  . Anxiety   . Hypertension   . Bipolar affective disorder ([place])   . GERD (gastroesophageal reflux disease)   . Arthritis   . Schizophrenia ([place])   . Overactive bladder     Past Surgical History:  Procedure Laterality Date  . BACK SURGERY     3 back surgeries  . CHOLECYSTECTOMY    . COLONOSCOPY WITH PROPOFOL N/A 01/23/2016   Procedure: COLONOSCOPY WITH PROPOFOL;  Surgeon: Wonda Horner, MD;  Location: [facility];  Service: Endoscopy;  Laterality: N/A;  . EYE SURGERY     Metal plate in right eye  . gallstones reomved    . LUMBAR LAMINECTOMY/DECOMPRESSION MICRODISCECTOMY  04/01/2012   Procedure: LUMBAR LAMINECTOMY/DECOMPRESSION MICRODISCECTOMY 2 LEVELS;  Surgeon: Faythe Ghee, MD;  Location: [facility];  Service: Neurosurgery;  Laterality: Left;  Lumbar four-five, lumbar five sacral one microdiscectomy   . LUMBAR WOUND DEBRIDEMENT  04/29/2012   Procedure: LUMBAR WOUND DEBRIDEMENT;  Surgeon: Faythe Ghee, MD;  Location: [facility];  Service: Neurosurgery;  Laterality: N/A;  lumbar wound debridement  . ROTATOR CUFF REPAIR     Right shoulder  . TUBAL LIGATION      OB History    No data available       Home Medications    Prior to Admission medications   Medication Sig Start Date End Date Taking? Authorizing Provider  albuterol (PROAIR HFA) 108 (90 Base) MCG/ACT inhaler INHALE TWO PUFFS INTO THE LUNGS EVERY SIX HOURS AS NEEDED FOR WHEEZING OR SHORTNESS OF BREATH Patient taking differently: Inhale 2 puffs into the lungs every 6 (six) hours as needed for  wheezing or shortness of breath.  11/07/15   Lance Bosch, NP  amLODipine (NORVASC) 10 MG tablet Take 1 tablet (10 mg total) by mouth daily. Must have office visit for refills 03/31/16   Tresa Garter, MD  ARIPiprazole (ABILIFY) 20 MG tablet Take 1 tablet (20 mg total) by mouth at bedtime. 03/04/15   Lance Bosch, NP  bumetanide (BUMEX) 2 MG tablet Take 1 tablet (2 mg total) by mouth daily. 05/24/15   [name], MD  chlorhexidine (PERIDEX) 0.12 % solution Use as directed 15 mLs in the mouth or throat 3 (three) times daily. Patient taking differently: Use as directed 15 mLs in the mouth or throat daily as needed (mouth pain).  07/26/15   Billy Fischer, MD  gabapentin (NEURONTIN) 300 MG capsule TAKE ONE (1) CAPSULE BY MOUTH 2 TIMES DAILY 01/22/15   Lance Bosch, NP  HYDROcodone-acetaminophen (NORCO/VICODIN) 5-325 MG tablet Take 2 tablets by mouth every 4 (four) hours as needed. 02/26/16   Nona Dell, PA-C  ibuprofen (ADVIL,MOTRIN) 600 MG tablet Take 1 tablet (600 mg total) by mouth every 6 (six) hours as needed. 04/07/16   Montine Circle, PA-C  meloxicam (MOBIC) 7.5 MG tablet Take 2 tablets (15 mg total) by mouth daily. 03/09/16   Larene Pickett, PA-C  omeprazole (PRILOSEC) 20 MG capsule Take 1 capsule (20 mg total) by mouth daily. 03/04/15   Lance Bosch, NP  potassium chloride 20 MEQ TBCR Take 20 mEq by mouth 2 (two) times daily. 05/26/15   Eileen Stanford, PA-C  triamcinolone cream (KENALOG) 0.1 % Apply 1 application topically 2 (two) times daily as needed (foot rash). 11/07/15   Lance Bosch, NP    Family History Family History  Problem Relation Age of Onset  . Diabetes Mother   . Diabetes Father   . Heart disease Paternal Aunt   . Cancer Paternal Aunt     Social History Social History  Substance Use Topics  . Smoking status: Former Smoker    Packs/day: 0.25    Years: 9.00    Types: Cigarettes    Quit date: 05/23/2014  . Smokeless tobacco: Never Used  .  Alcohol use No     Comment: quit Nov. 2014     Allergies   Aspirin   Review of Systems Review of Systems  Constitutional: Positive for fatigue. Negative for fever.  HENT: Positive for sore throat and voice change.   Respiratory: Positive for cough and shortness of breath.   Cardiovascular: Positive for chest pain.  Gastrointestinal: Positive for diarrhea and nausea. Negative for vomiting.  Musculoskeletal: Positive for neck pain.  Neurological: Positive for light-headedness. Negative for headaches.  All other systems reviewed and are negative.    Physical Exam  Updated Vital Signs BP 115/70 (BP Location: Left Arm)   Pulse 80   Temp 97.5 F (36.4 C) (Oral)   Resp 18   LMP 03/24/2016   SpO2 100%   Physical Exam  Constitutional: She is oriented to person, place, and time. She appears well-developed and well-nourished. No distress.  HENT:  Head: Normocephalic and atraumatic.  Mouth/Throat: Posterior oropharyngeal erythema present. No oropharyngeal exudate.  Floor of mouth is soft  Eyes: Conjunctivae and EOM are normal. Pupils are equal, round, and reactive to light.  Neck: Normal range of motion. Neck supple.  No meningismus. tender cervical lymphadenopathy bilaterally  Cardiovascular: Normal rate, regular rhythm, normal heart sounds and intact distal pulses.   No murmur heard. Pulmonary/Chest: Effort normal. No respiratory distress. She has wheezes. She exhibits tenderness.  Reproducible left sided chest pain; scattered wheezing throughout  Abdominal: Soft. There is no tenderness. There is no rebound and no guarding.  Musculoskeletal: Normal range of motion. She exhibits no edema or tenderness.  Lymphadenopathy:    She has cervical adenopathy.  Neurological: She is alert and oriented to person, place, and time. No cranial nerve deficit. She exhibits normal muscle tone. Coordination normal.  No ataxia on finger to nose bilaterally. No pronator drift. 5/5 strength  throughout. CN 2-12 intact.Equal grip strength. Sensation intact.   Skin: Skin is warm.  Psychiatric: She has a normal mood and affect. Her behavior is normal.  Nursing note and vitals reviewed.    ED Treatments / Results  DIAGNOSTIC STUDIES: Oxygen Saturation is 100% on RA, normal by my interpretation.    COORDINATION OF CARE: 12:17 AM-Discussed treatment plan which includes labs, EKG, and x-ray with pt at bedside and pt agreed to plan.    Labs (all labs ordered are listed, but only abnormal results are displayed) Labs Reviewed  BASIC METABOLIC PANEL - Abnormal; Notable for the following:       Result Value   Sodium 134 (*)    Potassium 3.1 (*)    Chloride 100 (*)    Glucose, Bld 116 (*)    All other components within normal limits  RAPID STREP SCREEN (NOT AT [facility])  CULTURE, GROUP A STREP [facility])  CBC  TROPONIN I  I-STAT TROPOININ, ED    EKG  EKG Interpretation  Date/Time:  Thursday April 16 2016 20:28:58 EDT Ventricular Rate:  88 PR Interval:  172 QRS Duration: 86 QT Interval:  410 QTC Calculation: 496 R Axis:   36 Text Interpretation:  Normal sinus rhythm Prolonged QT Abnormal ECG No significant change was found Confirmed by [place] [phone]) on 04/17/2016 12:05:37 AM       Radiology Dg Chest 2 View  Result Date: 04/16/2016 CLINICAL DATA:  Left-sided chest pain and shortness of breath. EXAM: CHEST  2 VIEW COMPARISON:  02/26/2016 FINDINGS: There is mild cardiomegaly.  Mediastinal contours appear intact. There is no evidence of focal airspace consolidation, pleural effusion or pneumothorax. Osseous structures are without acute abnormality. Soft tissues are grossly normal. IMPRESSION: No active cardiopulmonary disease. Electronically Signed   By: Fidela Salisbury M.D.   On: 04/16/2016 21:07   Ct Soft Tissue Neck W Contrast  Result Date: 04/17/2016 CLINICAL DATA:  Dysphagia.  History of bronchitis. EXAM: CT NECK WITH CONTRAST TECHNIQUE: Multidetector CT  imaging of the neck was performed using the standard protocol following the bolus administration of intravenous contrast. CONTRAST:  18mL ISOVUE-300 IOPAMIDOL (ISOVUE-300) INJECTION 61% COMPARISON:  Cervical spine radiographs April 21, 2015 FINDINGS: Mild motion  degraded examination, per technologist's note patient was snoring. Pharynx and larynx: Normal, airway is widely patent. Salivary glands: Normal. Thyroid: Mild thyromegaly, no discrete nodule. Lymph nodes: No lymphadenopathy by CT size criteria. Vascular: Normal. Limited intracranial: Normal. Mastoids and visualized paranasal sinuses: Included paranasal sinuses and mastoid air cells are well aerated. Orbits:  Old RIGHT orbital floor fracture. Skeleton: Straightened cervical lordosis without destructive bony lesions. Upper chest: Lung apices are clear. No superior mediastinal lymphadenopathy. IMPRESSION: No acute process on this motion degraded examination. Patent airway. Mild thyromegaly. Electronically Signed   By: Elon Alas M.D.   On: 04/17/2016 06:30    Procedures Procedures (including critical care time)  Medications Ordered in ED Medications  nitroGLYCERIN (NITROSTAT) 0.4 MG SL tablet (not administered)     Initial Impression / Assessment and Plan / ED Course  I have reviewed the triage vital signs and the nursing notes.  Pertinent labs & imaging results that were available during my care of the patient were reviewed by me and considered in my medical decision making (see chart for details).  Clinical Course  Patient presents with a four-day history of right-sided neck pain and sore throat with pain with swallowing. No fever. Developed left-sided chest pain while arriving at the emergency department tonight around 8 PM. Pain is constant and worse with palpation and movement.  Rapid strep is negative. Chest x-rays negative. EKG is nonischemic. Negative stress test September 2016.  Low suspicion for ACS. Chest pain is  reproducible. Doubt PE. No tachycardia or hypoxia. Troponin negative 2.  CT neck obtained with ongoing neck and throat pain.  LAD on exam.  CT negative for abscess or other pathology.  Patient tolerating PO and ambulatory. Suspect viral pharyngitis. Chest pain reproducible, atypical for ACS, troponin negative x2.   Final Clinical Impressions(s) / ED Diagnoses   Final diagnoses:  Atypical chest pain  Pharyngitis    I personally performed the services described in this documentation, which was scribed in my presence. The recorded information has been reviewed and is accurate.   New Prescriptions New Prescriptions   No medications on file     Ezequiel Essex, MD 04/17/16 1428

## 2016-04-17 NOTE — Discharge Instructions (Signed)
There is no evidence of heart attack. Follow up wi th your doctor. Return to the ED if you develop new or worsening symptoms.

## 2016-04-17 NOTE — ED Notes (Signed)
Pt OOB to use restroom. Pt presents with steady gait.

## 2016-04-19 LAB — CULTURE, GROUP A STREP (THRC)

## 2016-04-21 IMAGING — CT CT MAXILLOFACIAL W/ CM
3 series · 15 of 47 positions shown, 18 images · IV contrast (Iodine)
Comparison: None.

CLINICAL DATA: Facial swelling.

EXAM:
CT MAXILLOFACIAL WITH CONTRAST
TECHNIQUE: Multidetector CT imaging of the maxillofacial structures was
performed with intravenous contrast. Multiplanar CT image
reconstructions were also generated. A small metallic BB was placed
on the right temple in order to reliably differentiate right from
left.
CONTRAST:  75mL OMNIPAQUE IOHEXOL 300 MG/ML  SOLN

[Series 201: facial bones · axial · 0.31mm/px · z∈[+59,+209]mm · 9 of 87 slices shown, 12 images]
[im 6/87  brain]
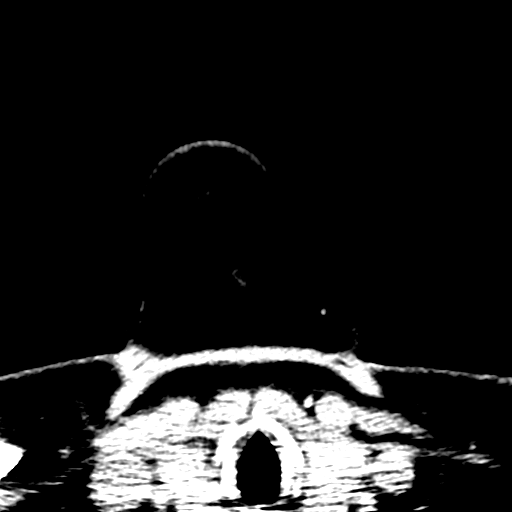
[im 6/87  bone]
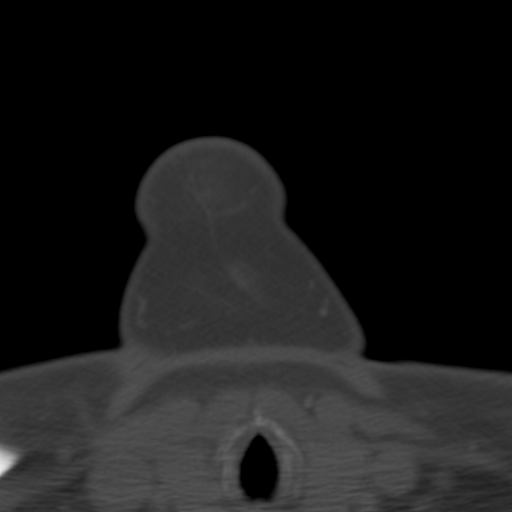
[im 15/87  bone]
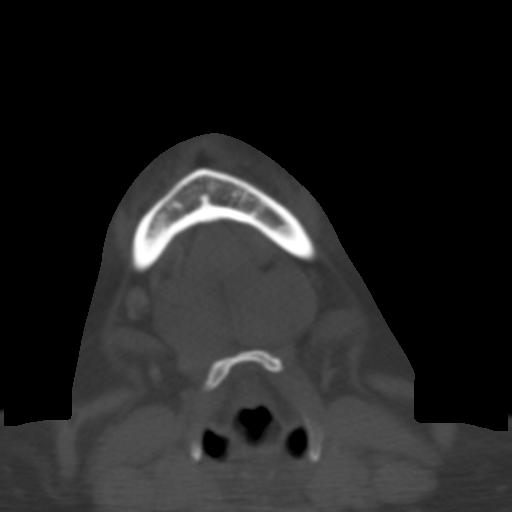
[im 24/87  bone]
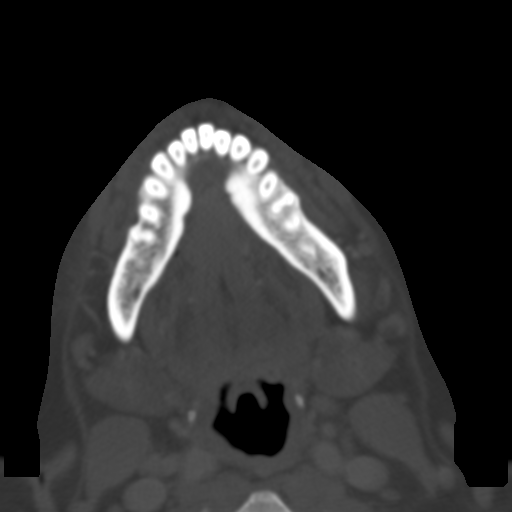
[im 33/87  bone]
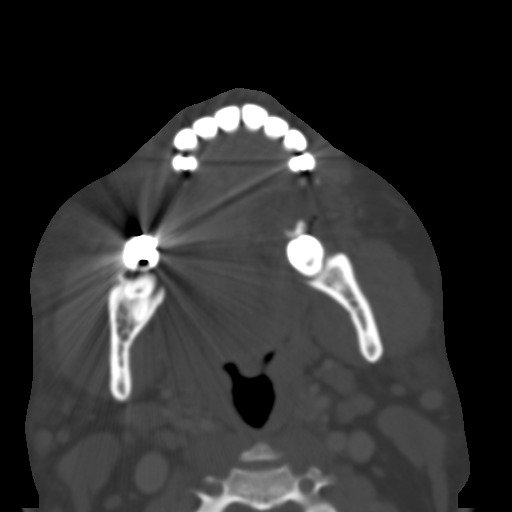
[im 45/87  brain]
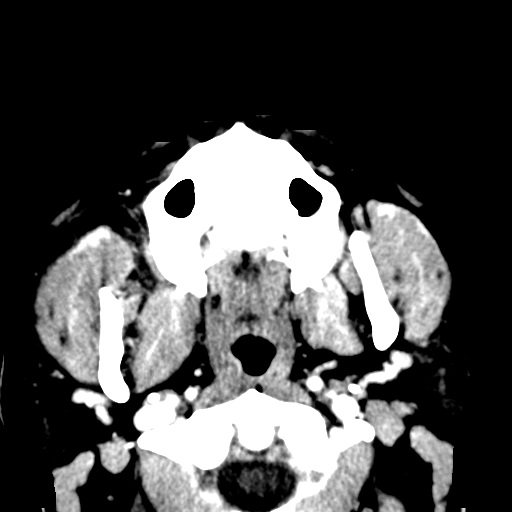
[im 45/87  bone]
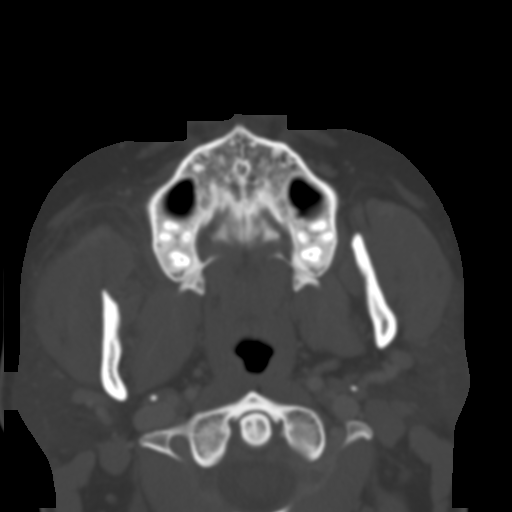
[im 54/87  bone]
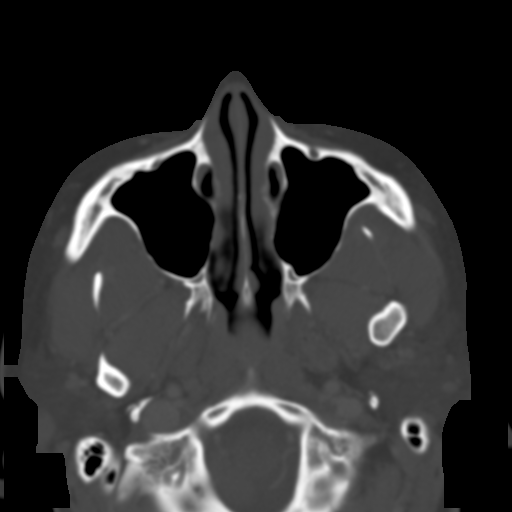
[im 63/87  bone]
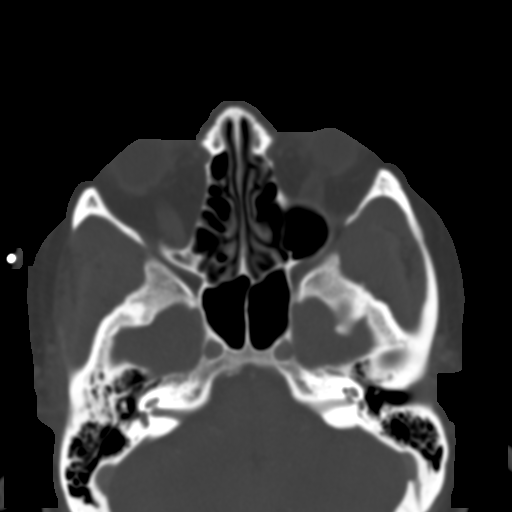
[im 72/87  bone]
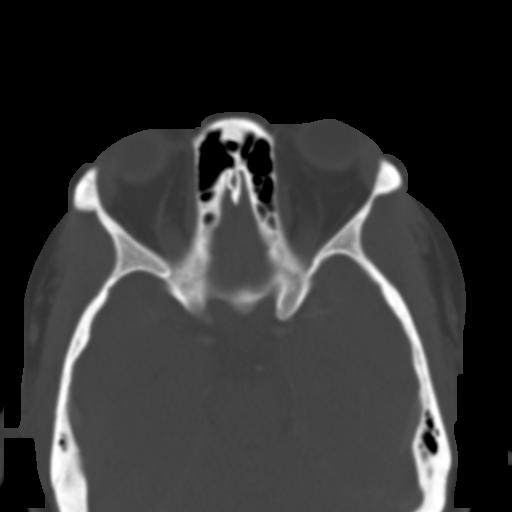
[im 81/87  brain]
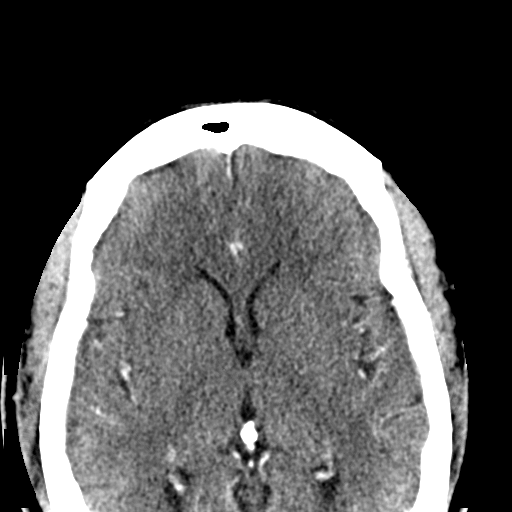
[im 81/87  bone]
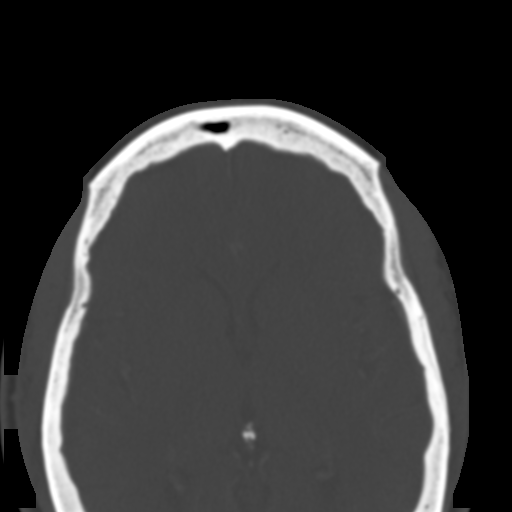

[Series 204: sagittal std · sagittal · 0.32mm/px · 3 of 80 slices shown]
[im 27/80  bone]
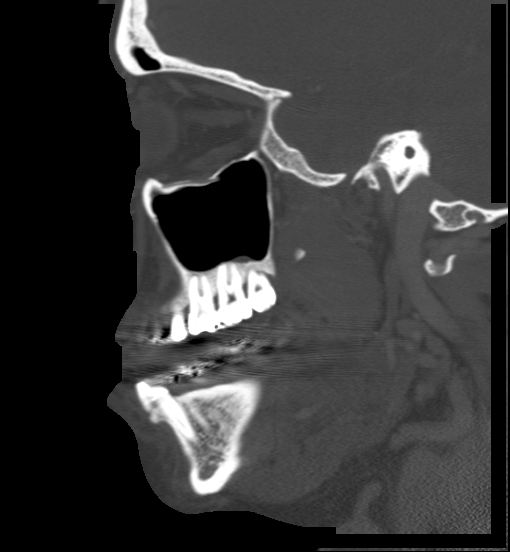
[im 40/80  bone]
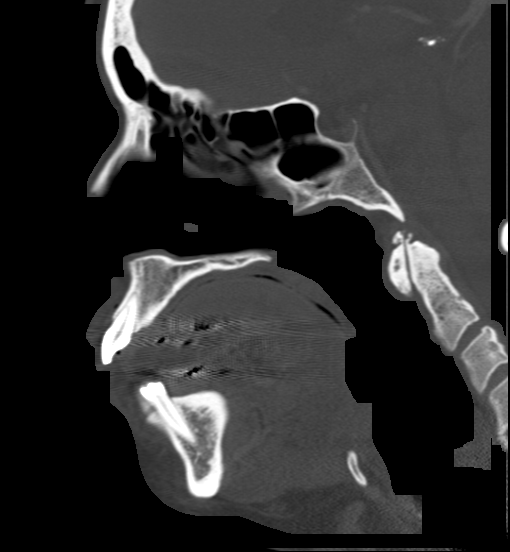
[im 53/80  bone]
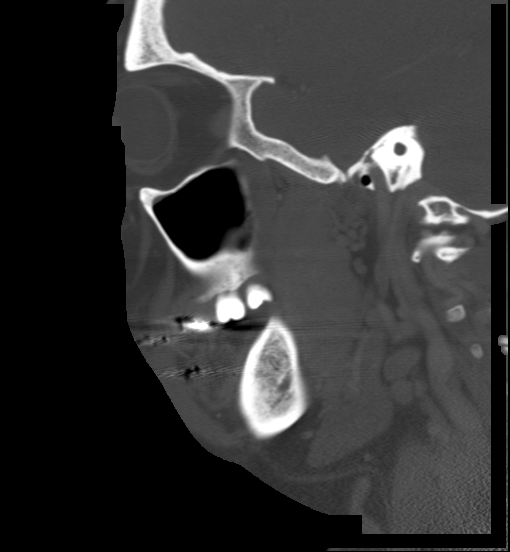

[Series 2010: coronals · coronal · 0.32mm/px · 3 of 51 slices shown]
[im 17/51  bone]
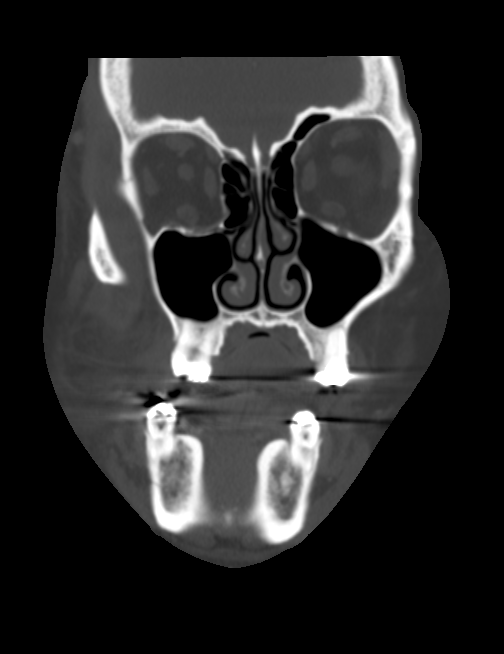
[im 23/51  bone]
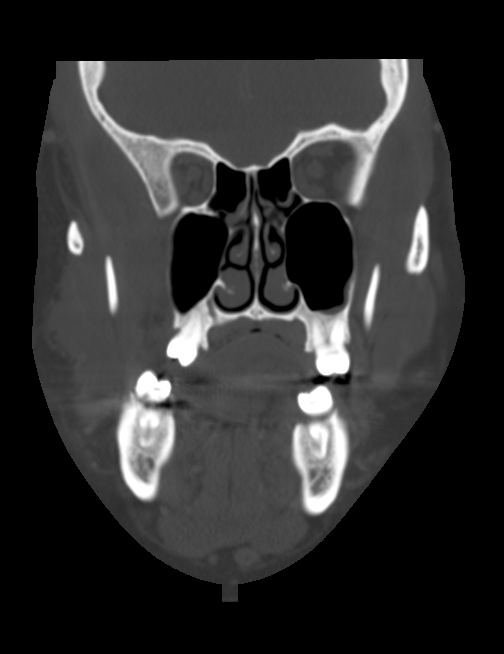
[im 28/51  bone]
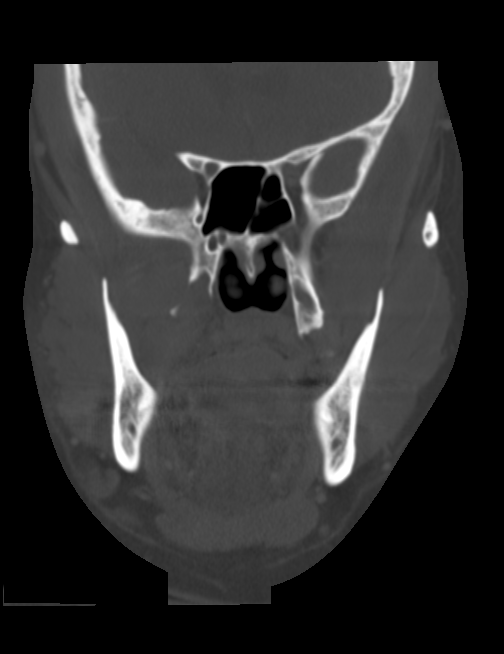

[15 of 47 positions shown; findings below may reference images not displayed]

FINDINGS: There is edema within the right cheek, centered in the buccal space
and throughout the masticator compartment. The muscles of
mastication are enlarged as a result. There is likely trismus as the
right TMJ is located and the left is anteriorly subluxed. The cause
is usually odontogenic, although no periapical erosion or definite
cavity is identified. There is no visible stone within the parotid
duct, as permitted by dental amalgam; no dilation of the duct or
primary inflammation in the parotid gland. No primary inflammation
in the floor of mouth or within the submandibular glands. No
evidence of tonsillitis or sinusitis. The major vessels in the neck
are patent. Orbits are negative. Clear paranasal sinuses.
IMPRESSION: Edema in the right buccal and masticator spaces which is likely
infection. The source is not identified, but based on location
odontogenic origin is favored. No abscess.

## 2016-04-22 ENCOUNTER — Ambulatory Visit: Payer: Medicaid Other | Admitting: Cardiovascular Disease

## 2016-04-22 ENCOUNTER — Inpatient Hospital Stay: Payer: Medicaid Other | Admitting: Internal Medicine

## 2016-04-22 DIAGNOSIS — I5032 Chronic diastolic (congestive) heart failure: Secondary | ICD-10-CM | POA: Insufficient documentation

## 2016-04-22 DIAGNOSIS — R7303 Prediabetes: Secondary | ICD-10-CM | POA: Insufficient documentation

## 2016-04-23 ENCOUNTER — Encounter: Payer: Self-pay | Admitting: *Deleted

## 2016-04-23 NOTE — Progress Notes (Signed)
Letter mailed

## 2016-05-04 ENCOUNTER — Encounter (HOSPITAL_COMMUNITY): Payer: Self-pay | Admitting: Emergency Medicine

## 2016-05-04 ENCOUNTER — Emergency Department (HOSPITAL_COMMUNITY)
Admission: EM | Admit: 2016-05-04 | Discharge: 2016-05-04 | Disposition: A | Discharge status: Home / Self Care | Payer: Medicaid Other | Attending: Emergency Medicine | Admitting: Emergency Medicine

## 2016-05-04 DIAGNOSIS — Z79899 Other long term (current) drug therapy: Secondary | ICD-10-CM | POA: Insufficient documentation

## 2016-05-04 DIAGNOSIS — I110 Hypertensive heart disease with heart failure: Secondary | ICD-10-CM | POA: Insufficient documentation

## 2016-05-04 DIAGNOSIS — Z791 Long term (current) use of non-steroidal anti-inflammatories (NSAID): Secondary | ICD-10-CM | POA: Insufficient documentation

## 2016-05-04 DIAGNOSIS — I5032 Chronic diastolic (congestive) heart failure: Secondary | ICD-10-CM | POA: Insufficient documentation

## 2016-05-04 DIAGNOSIS — Y999 Unspecified external cause status: Secondary | ICD-10-CM | POA: Diagnosis not present

## 2016-05-04 DIAGNOSIS — J45909 Unspecified asthma, uncomplicated: Secondary | ICD-10-CM | POA: Insufficient documentation

## 2016-05-04 DIAGNOSIS — Y9389 Activity, other specified: Secondary | ICD-10-CM | POA: Diagnosis not present

## 2016-05-04 DIAGNOSIS — W540XXA Bitten by dog, initial encounter: Secondary | ICD-10-CM | POA: Diagnosis not present

## 2016-05-04 DIAGNOSIS — S61357A Open bite of left little finger with damage to nail, initial encounter: Secondary | ICD-10-CM | POA: Diagnosis not present

## 2016-05-04 DIAGNOSIS — Y929 Unspecified place or not applicable: Secondary | ICD-10-CM | POA: Insufficient documentation

## 2016-05-04 DIAGNOSIS — S61355A Open bite of left ring finger with damage to nail, initial encounter: Secondary | ICD-10-CM | POA: Insufficient documentation

## 2016-05-04 DIAGNOSIS — Z87891 Personal history of nicotine dependence: Secondary | ICD-10-CM | POA: Insufficient documentation

## 2016-05-04 DIAGNOSIS — Z23 Encounter for immunization: Secondary | ICD-10-CM | POA: Insufficient documentation

## 2016-05-04 MED ORDER — RABIES IMMUNE GLOBULIN 150 UNIT/ML IM INJ
20.0000 [IU]/kg | INJECTION | Freq: Once | INTRAMUSCULAR | Status: AC
  Administered 2016-05-04: 1875 [IU]
  Filled 2016-05-04: qty 12.5

## 2016-05-04 MED ORDER — RABIES VACCINE, PCEC IM SUSR
1.0000 mL | Freq: Once | INTRAMUSCULAR | Status: AC
  Administered 2016-05-04: 1 mL via INTRAMUSCULAR
  Filled 2016-05-04: qty 1

## 2016-05-04 NOTE — Discharge Instructions (Signed)
Please follow up with the [facility] for your rabies vaccines - See attached sheet for schedule.  Keep area clean and dry, washing with soap and water frequently throughout the day.  Return to ER for new or worsening symptoms, any additional concerns.

## 2016-05-04 NOTE — ED Triage Notes (Addendum)
Pt c/o pain to left fourth and fifth fingers. Pt states an unknown dog "tried to snatch chicken" out of her hand as she was eating it. Pt has skin tear below fourth and fifth finger nail beds. Pt reports applying iodine and peroxide to wounds.

## 2016-05-04 NOTE — ED Provider Notes (Signed)
[facility] DEPT Provider Note   CSN: [id number] Arrival date & time: 05/04/16  1646  By signing my name below, I, Soijett Blue, attest that this documentation has been prepared under the direction and in the presence of Pearlie Oyster, PA-C Electronically Signed: Soijett Blue, ED Scribe. 05/04/16. 5:22 PM.   History   Chief Complaint Chief Complaint  Patient presents with  . Animal Bite    HPI Lori Bean is a 45 y.o. female who presents to the Emergency Department complaining of a dog bite onset PTA. Pt notes that she was eating chicken when a stray dog grazed and broke the skin on her left ring and pinky fingers. She does not know where the dog is or any information about animal. Pt reports that she has not tried any medications for the relief of her symptoms. Pt denies color change, rash, swelling, and any other symptoms.   The history is provided by the patient. No language interpreter was used.    Past Medical History:  Diagnosis Date  . Anginal pain ([place])    occ from asthma  . Anxiety   . Arthritis   . Asthma   . Bipolar affective disorder ([place])    Schizophrenia  . Bronchitis    hx of  . CHF (congestive heart failure) ([place])   . Depression   . GERD (gastroesophageal reflux disease)   . Heart murmur   . Hypertension   . Overactive bladder   . Schizophrenia ([place])   . Sciatica   . Shortness of breath   . Snoring disorder    Pt stated my boyfriend always wakes me up and tells me to breathe    Patient Active Problem List   Diagnosis Date Noted  . Chronic diastolic CHF (congestive heart failure) ([place]) 04/22/2016  . Prediabetes 04/22/2016  . Tobacco abuse disorder 04/03/2014  . Obesity (BMI 30-39.9) 04/03/2014  . Chest pain 04/03/2014  . Mild cardiomegaly 02/21/2014  . Lumbar spondylosis 12/28/2013  . Extrinsic asthma, unspecified 06/07/2013  . Anxiety   . Hypertension   . Bipolar affective disorder ([place])   . GERD (gastroesophageal reflux disease)     . Arthritis   . Schizophrenia ([place])   . Overactive bladder     Past Surgical History:  Procedure Laterality Date  . BACK SURGERY     3 back surgeries  . CHOLECYSTECTOMY    . COLONOSCOPY WITH PROPOFOL N/A 01/23/2016   Procedure: COLONOSCOPY WITH PROPOFOL;  Surgeon: Wonda Horner, MD;  Location: [facility]COPY;  Service: Endoscopy;  Laterality: N/A;  . EYE SURGERY     Metal plate in right eye  . gallstones reomved    . LUMBAR LAMINECTOMY/DECOMPRESSION MICRODISCECTOMY  04/01/2012   Procedure: LUMBAR LAMINECTOMY/DECOMPRESSION MICRODISCECTOMY 2 LEVELS;  Surgeon: Faythe Ghee, MD;  Location: [facility];  Service: Neurosurgery;  Laterality: Left;  Lumbar four-five, lumbar five sacral one microdiscectomy   . LUMBAR WOUND DEBRIDEMENT  04/29/2012   Procedure: LUMBAR WOUND DEBRIDEMENT;  Surgeon: Faythe Ghee, MD;  Location: [facility];  Service: Neurosurgery;  Laterality: N/A;  lumbar wound debridement  . ROTATOR CUFF REPAIR     Right shoulder  . TUBAL LIGATION      OB History    No data available       Home Medications    Prior to Admission medications   Medication Sig Start Date End Date Taking? Authorizing Provider  albuterol (PROAIR HFA) 108 (90 Base) MCG/ACT inhaler INHALE TWO PUFFS INTO THE  LUNGS EVERY SIX HOURS AS NEEDED FOR WHEEZING OR SHORTNESS OF BREATH Patient taking differently: Inhale 2 puffs into the lungs every 6 (six) hours as needed for wheezing or shortness of breath.  11/07/15   Lance Bosch, NP  amLODipine (NORVASC) 10 MG tablet Take 1 tablet (10 mg total) by mouth daily. Must have office visit for refills 03/31/16   Tresa Garter, MD  ARIPiprazole (ABILIFY) 20 MG tablet Take 1 tablet (20 mg total) by mouth at bedtime. 03/04/15   Lance Bosch, NP  bumetanide (BUMEX) 2 MG tablet Take 1 tablet (2 mg total) by mouth daily. 05/24/15   [name], MD  chlorhexidine (PERIDEX) 0.12 % solution Use as directed 15 mLs in the mouth or throat 3 (three) times  daily. Patient taking differently: Use as directed 15 mLs in the mouth or throat daily as needed (mouth pain).  07/26/15   Billy Fischer, MD  gabapentin (NEURONTIN) 300 MG capsule TAKE ONE (1) CAPSULE BY MOUTH 2 TIMES DAILY 01/22/15   Lance Bosch, NP  HYDROcodone-acetaminophen (NORCO/VICODIN) 5-325 MG tablet Take 2 tablets by mouth every 4 (four) hours as needed. Patient taking differently: Take 2 tablets by mouth every 4 (four) hours as needed for moderate pain.  02/26/16   Nona Dell, PA-C  ibuprofen (ADVIL,MOTRIN) 600 MG tablet Take 1 tablet (600 mg total) by mouth every 6 (six) hours as needed. Patient taking differently: Take 600 mg by mouth every 6 (six) hours as needed for moderate pain.  04/07/16   Montine Circle, PA-C  meloxicam (MOBIC) 7.5 MG tablet Take 2 tablets (15 mg total) by mouth daily. 03/09/16   Larene Pickett, PA-C  omeprazole (PRILOSEC) 20 MG capsule Take 1 capsule (20 mg total) by mouth daily. 03/04/15   Lance Bosch, NP  potassium chloride 20 MEQ TBCR Take 20 mEq by mouth 2 (two) times daily. 05/26/15   Eileen Stanford, PA-C  triamcinolone cream (KENALOG) 0.1 % Apply 1 application topically 2 (two) times daily as needed (foot rash). 11/07/15   Lance Bosch, NP    Family History Family History  Problem Relation Age of Onset  . Diabetes Mother   . Diabetes Father   . Heart disease Paternal Aunt   . Cancer Paternal Aunt     Social History Social History  Substance Use Topics  . Smoking status: Former Smoker    Packs/day: 0.25    Years: 9.00    Types: Cigarettes    Quit date: 05/23/2014  . Smokeless tobacco: Never Used  . Alcohol use No     Comment: quit Nov. 2014     Allergies   Aspirin   Review of Systems Review of Systems  Musculoskeletal: Positive for arthralgias (left ring and pinky fingers). Negative for joint swelling.  Skin: Positive for wound. Negative for color change and rash.     Physical Exam Updated Vital Signs BP 165/92  (BP Location: Left Arm)   Pulse 65   Temp 98.5 F (36.9 C)   Resp 15   Ht 5\' 2"  (1.575 m)   Wt 93.4 kg   LMP 03/24/2016   SpO2 100%   BMI 37.68 kg/m   Physical Exam  Constitutional: She is oriented to person, place, and time. She appears well-developed and well-nourished. No distress.  HENT:  Head: Normocephalic and atraumatic.  Cardiovascular: Normal rate, regular rhythm, normal heart sounds and intact distal pulses.  Exam reveals no gallop and no friction rub.  No murmur heard. Pulmonary/Chest: Effort normal and breath sounds normal. No respiratory distress. She has no wheezes. She has no rales. She exhibits no tenderness.  Abdominal: Soft. Bowel sounds are normal. She exhibits no distension. There is no tenderness.  Musculoskeletal: She exhibits no edema.  Neurological: She is alert and oriented to person, place, and time.  Skin: Skin is warm and dry. Abrasion noted.  Superficial abrasions to the left 4th and 5th proximal nailbed.   Nursing note and vitals reviewed.    ED Treatments / Results  DIAGNOSTIC STUDIES: Oxygen Saturation is 99% on RA, nl by my interpretation.    COORDINATION OF CARE: 5:14 PM Discussed treatment plan with pt at bedside which includes rabies vaccination and pt agreed to plan.    Procedures Procedures (including critical care time)  Medications Ordered in ED Medications  rabies vaccine (RABAVERT) injection 1 mL (1 mL Intramuscular Given 05/04/16 1750)  rabies immune globulin (HYPERAB) injection 1,875 Units (1,875 Units Infiltration Given 05/04/16 1752)     Initial Impression / Assessment and Plan / ED Course  I have reviewed the triage vital signs and the nursing notes.   Clinical Course   ZYRIANNA KOHN is a 45 y.o. female who presents to ED after dog bite just prior to arrival. Marena Chancy about vaccine status of animal, and believes dog is a Writer. On exam, RUE is NVI. Very superficial abrasion with no bleeding of nailbed. I do not  believe antibiotics are warranted for this pinpoint skin-tear injury. Area was thoroughly cleaned while in ED and patient educated on signs of infection/results returned to ED. Rabies vaccine given. Follow up care with [facility] urgent care discussed and schedule given. All questions answered.     Final Clinical Impressions(s) / ED Diagnoses   Final diagnoses:  Dog bite    New Prescriptions Discharge Medication List as of 05/04/2016  6:17 PM     I personally performed the services described in this documentation, which was scribed in my presence. The recorded information has been reviewed and is accurate.    [facility], PA-C 05/04/16 1831    Shanon Rosser, MD 05/04/16 [phone]

## 2016-05-07 IMAGING — CR DG CHEST 1V PORT
1 series · 1 of 1 positions shown · non-contrast
Comparison: 11/12/2013

CLINICAL DATA: Shortness of breath and central chest pain today

EXAM:
PORTABLE CHEST - 1 VIEW

[AP]
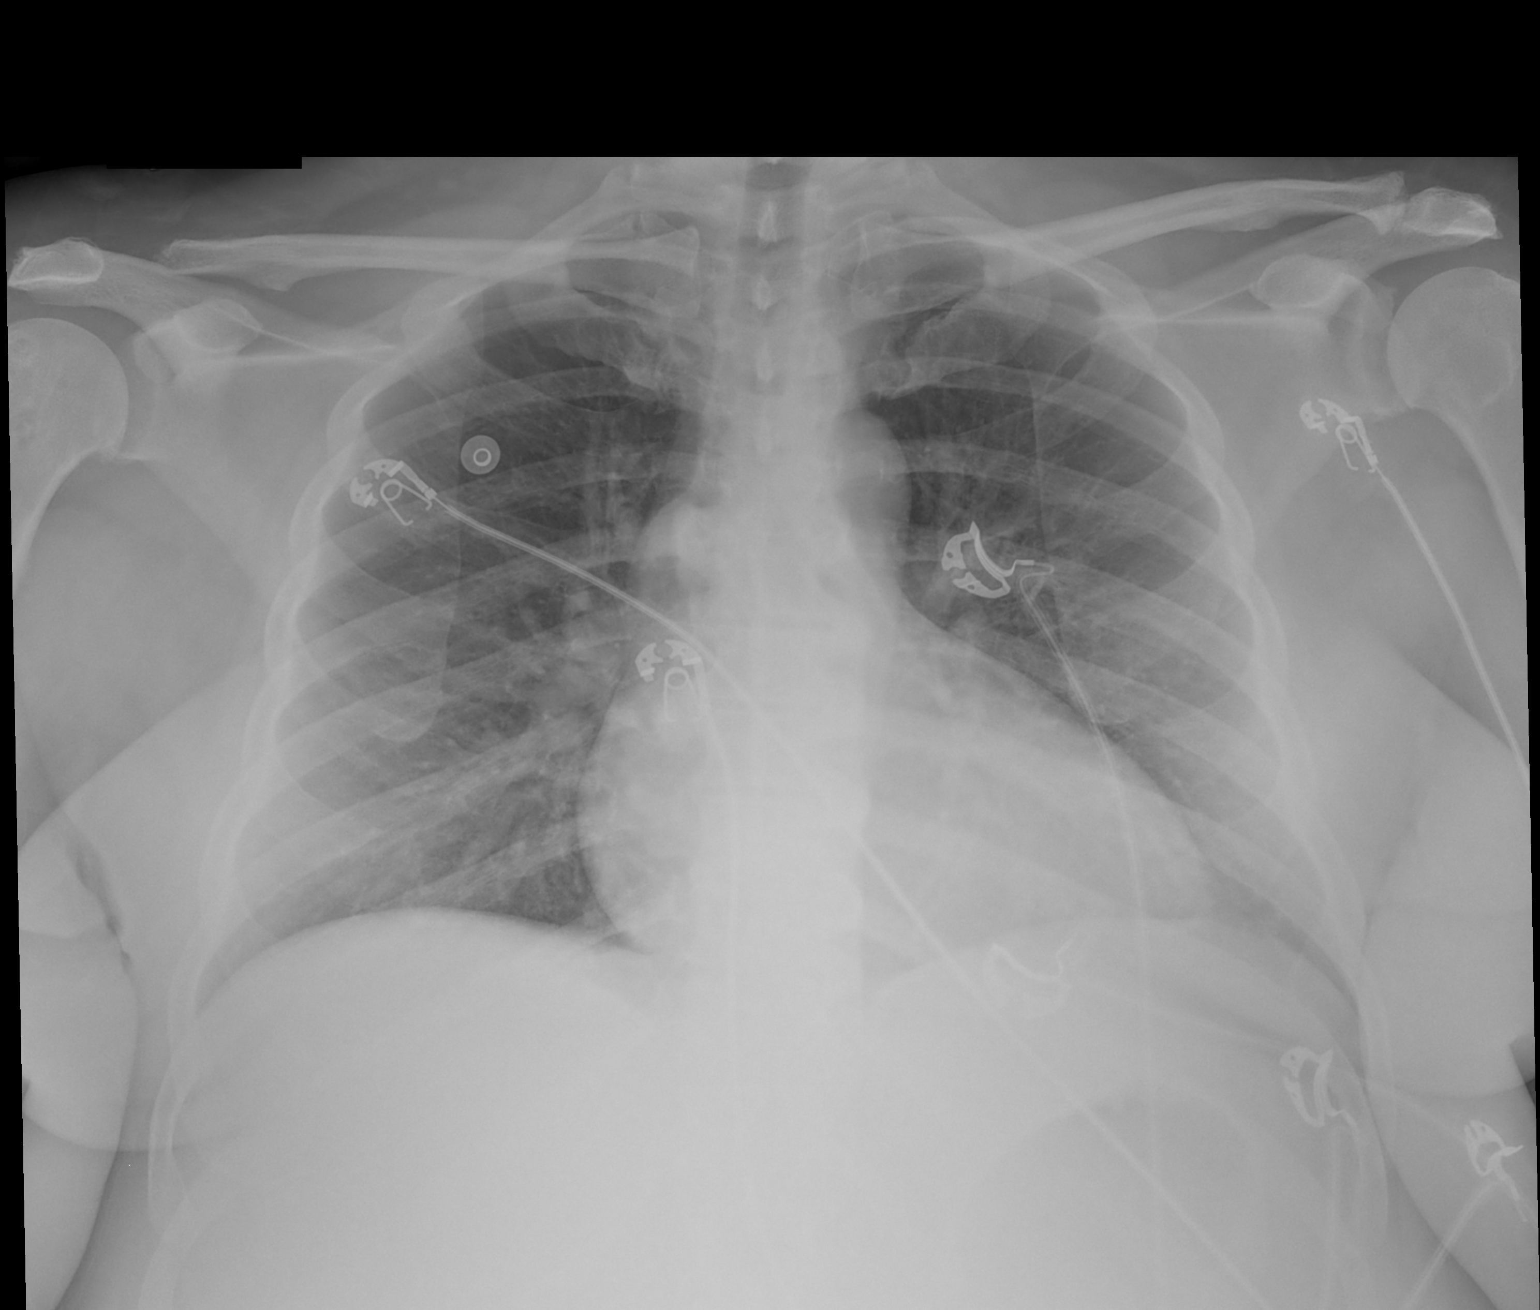

[1 of 1 positions shown; findings below may reference images not displayed]

FINDINGS: Mild cardiac enlargement stable. Vascular pattern normal. Lungs
clear.
IMPRESSION: No active disease.

## 2016-05-12 ENCOUNTER — Encounter (HOSPITAL_COMMUNITY): Payer: Self-pay | Admitting: Emergency Medicine

## 2016-05-12 ENCOUNTER — Ambulatory Visit (HOSPITAL_COMMUNITY)
Admission: EM | Admit: 2016-05-12 | Discharge: 2016-05-12 | Disposition: A | Discharge status: Home / Self Care | Payer: Medicaid Other | Attending: Family Medicine | Admitting: Family Medicine

## 2016-05-12 DIAGNOSIS — Z203 Contact with and (suspected) exposure to rabies: Secondary | ICD-10-CM | POA: Diagnosis not present

## 2016-05-12 MED ORDER — RABIES VACCINE, PCEC IM SUSR
1.0000 mL | Freq: Once | INTRAMUSCULAR | Status: AC
  Administered 2016-05-12: 1 mL via INTRAMUSCULAR

## 2016-05-12 MED ORDER — RABIES VACCINE, PCEC IM SUSR
INTRAMUSCULAR | Status: AC
  Filled 2016-05-12: qty 1

## 2016-05-12 NOTE — ED Triage Notes (Addendum)
The patient presented to the [facility] to receive the 2nd injection in the rabies series. Day 0 was 05/04/2016 in the [facility] ED. The patient was provided a pre-exposure schedule to follow by [facility] ED instead of the post exposure schedule. Main pharmacy was contacted and advised to treat today as day 3 and proceed with the normal schedule. The provider was advised of the pharmacy recommendation. The updated schedule is as follows: Day 0 05/04/2016 , Day 3 05/12/2016, Day 7 05/16/2016 and Day 14 05/23/2016.

## 2016-05-12 NOTE — Discharge Instructions (Signed)
Please return to the [facility] on 05/16/2016 for your next injection. Your schedule for updated injection schedule is as follows:  Day 0 : 05/04/2016 Day 3:  05/12/2016 Day 7:  05/16/2016 Day 14: 05/23/2016

## 2016-05-13 ENCOUNTER — Encounter: Payer: Self-pay | Admitting: *Deleted

## 2016-05-15 ENCOUNTER — Ambulatory Visit (INDEPENDENT_AMBULATORY_CARE_PROVIDER_SITE_OTHER): Payer: Medicaid Other | Admitting: Diagnostic Neuroimaging

## 2016-05-15 ENCOUNTER — Encounter: Payer: Self-pay | Admitting: Diagnostic Neuroimaging

## 2016-05-15 VITALS — BP 128/77 | HR 90 | Ht 62.0 in | Wt 214.0 lb

## 2016-05-15 DIAGNOSIS — M5441 Lumbago with sciatica, right side: Secondary | ICD-10-CM | POA: Diagnosis not present

## 2016-05-15 DIAGNOSIS — M5442 Lumbago with sciatica, left side: Secondary | ICD-10-CM
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
## 2016-05-15 NOTE — Patient Instructions (Signed)
Thank you for coming to see [place] at [facility]. I hope we have been able to provide you high quality care today.  You may receive a patient satisfaction survey over the next few weeks. We would appreciate your feedback and comments so that we may continue to improve ourselves and the health of our patients.  - consider physical therapy - consider return to pain mgmt clinic (previously has seen Dr. Brien Few for lumbar epidural steroid injection)   ~~~~~~~~~~~~~~~~~~~~~~~~~~~~~~~~~~~~~~~~~~~~~~~~~~~~~~~~~~~~~~~~~  DR. PENUMALLI'S GUIDE TO HAPPY AND HEALTHY LIVING These are some of my general health and wellness recommendations. Some of them may apply to you better than others. Please use common sense as you try these suggestions and feel free to ask me any questions.   ACTIVITY/FITNESS Mental, social, emotional and physical stimulation are very important for brain and body health. Try learning a new activity (arts, music, language, sports, games).  Keep moving your body to the best of your abilities. You can do this at home, inside or outside, the park, community center, gym or anywhere you like. Consider a physical therapist or personal trainer to get started. Consider the app Sworkit. Fitness trackers such as smart-watches, smart-phones or Fitbits can help as well.   NUTRITION Eat more plants: colorful vegetables, nuts, seeds and berries.  Eat less sugar, salt, preservatives and processed foods.  Avoid toxins such as cigarettes and alcohol.  Drink water when you are thirsty. Warm water with a slice of lemon is an excellent morning drink to start the day.  Consider these websites for more information The Nutrition Source (https://www.henry-hernandez.biz/) [employer] Nutrition (WindowBlog.ch)   RELAXATION Consider practicing mindfulness meditation or other relaxation techniques such as deep breathing, prayer, yoga, tai chi,  massage. See website mindful.org or the apps Headspace or Calm to help get started.   SLEEP Try to get at least 7-8+ hours sleep per day. Regular exercise and reduced caffeine will help you sleep better. Practice good sleep hygeine techniques. See website sleep.org for more information.   PLANNING Prepare estate planning, living will, healthcare POA documents. Sometimes this is best planned with the help of an attorney. Theconversationproject.org and agingwithdignity.org are excellent resources.

## 2016-06-10 IMAGING — MR MR LUMBAR SPINE WO/W CM
4 of 7 series · 18 of 48 positions shown · IV contrast (20    MULTI)
Comparison: Plain films 03/26/2014. CT myelogram 11/29/2013. MRI
lumbar spine 08/29/2012.

CLINICAL DATA: Difficulty ambulating with severe low back pain.
Previous fusion.

EXAM:
MRI LUMBAR SPINE WITHOUT AND WITH CONTRAST
TECHNIQUE: Multiplanar and multiecho pulse sequences of the lumbar spine were
obtained without and with intravenous contrast.
CONTRAST:  20mL MULTIHANCE GADOBENATE DIMEGLUMINE 529 MG/ML IV SOLN

[Series 3: T2 · sagittal · 4.0mm · 0.55mm/px · 5 of 13 slices shown (1 of 2)]
[im 1/13]
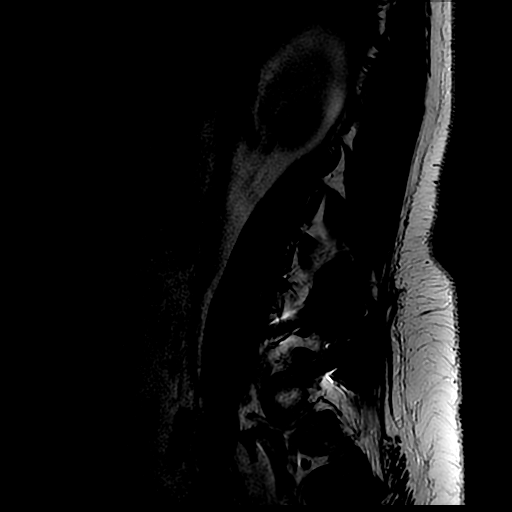
[im 4/13]
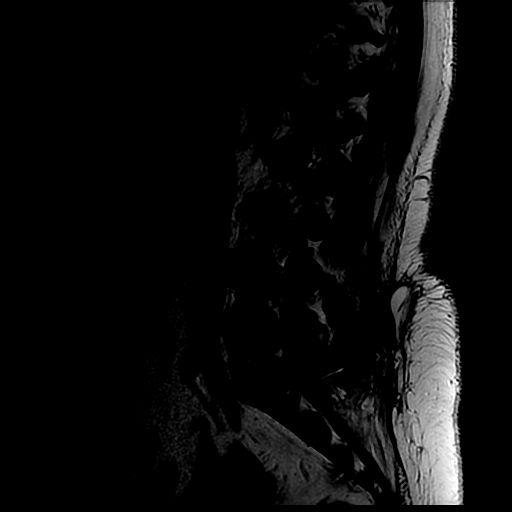
[im 7/13]
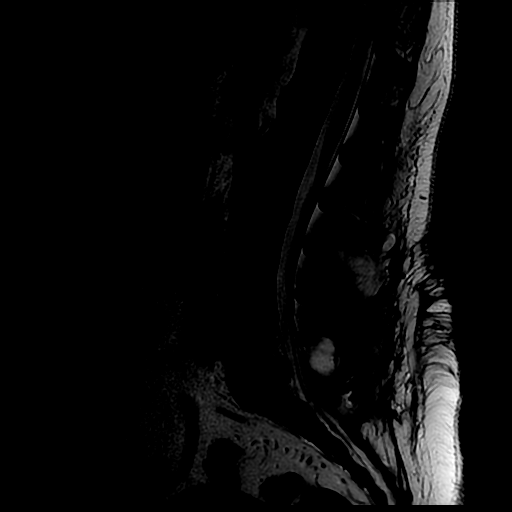
[im 10/13]
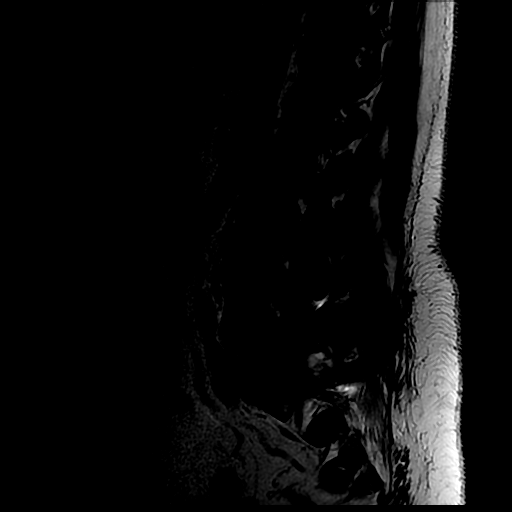
[im 13/13]
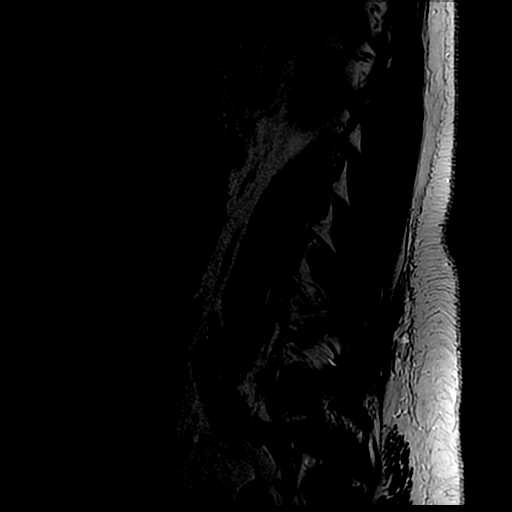

[Series 4: T1 · sagittal · 4.0mm · 0.55mm/px · 3 of 13 slices shown (1 of 2)]
[im 1/13]
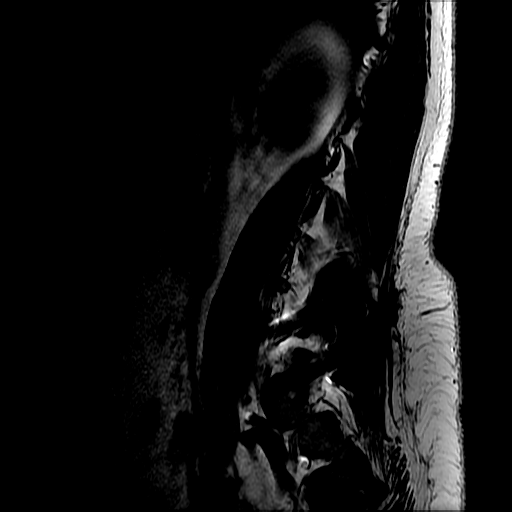
[im 7/13]
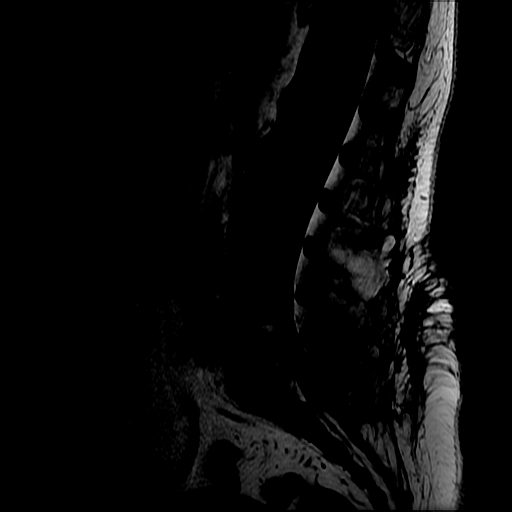
[im 13/13]
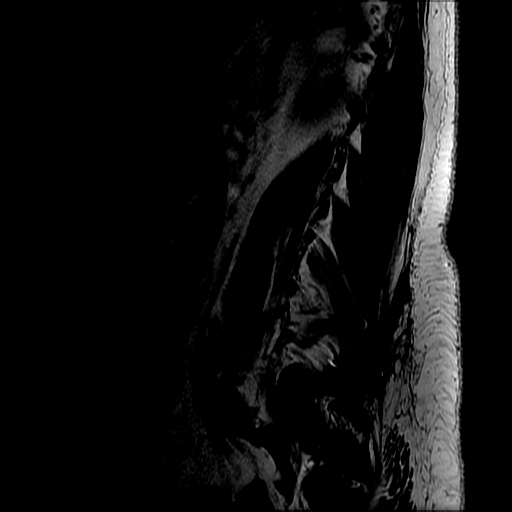

[Series 6: T1 · axial · 4.0mm · 0.39mm/px · z∈[-90,+49]mm · 3 of 32 slices shown (2 of 2)]
[im 4/32]
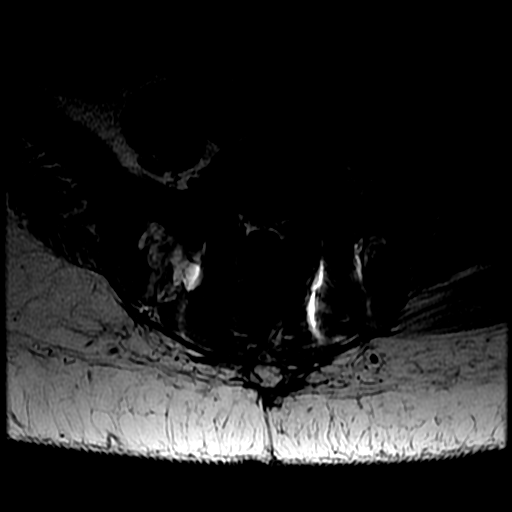
[im 18/32]
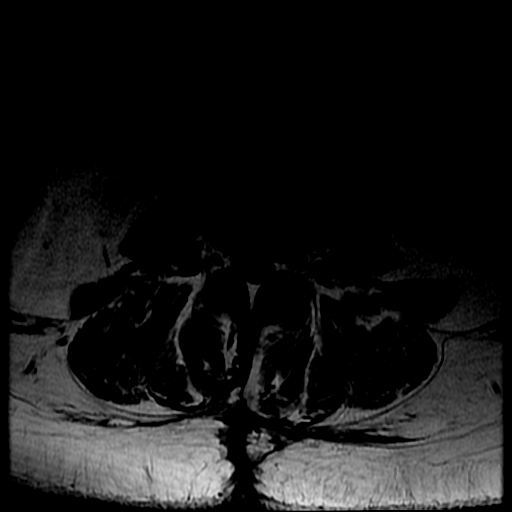
[im 28/32]
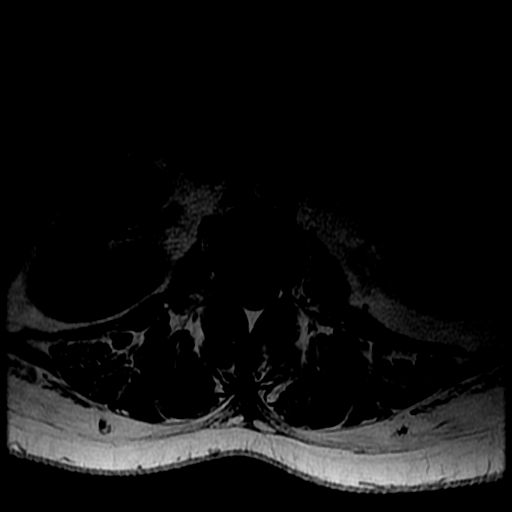

[Series 7: T2 · axial · 4.0mm · 0.39mm/px · z∈[-105,+49]mm · 7 of 32 slices shown (2 of 2)]
[im 1/32]
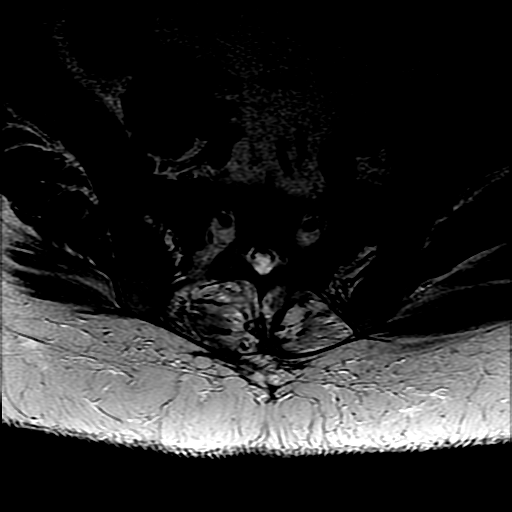
[im 4/32]
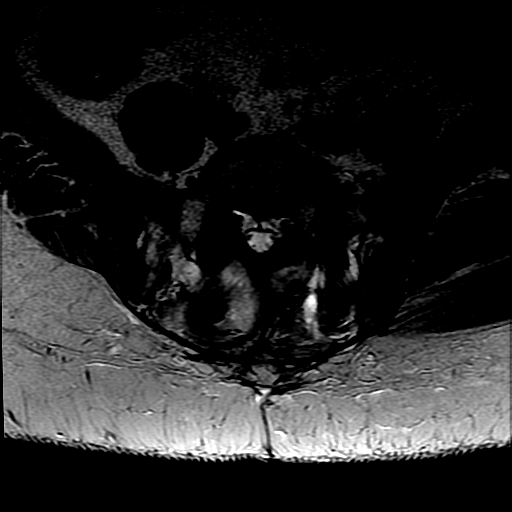
[im 11/32]
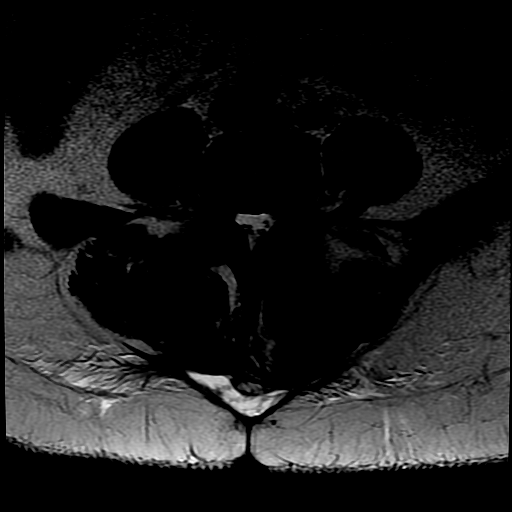
[im 14/32]
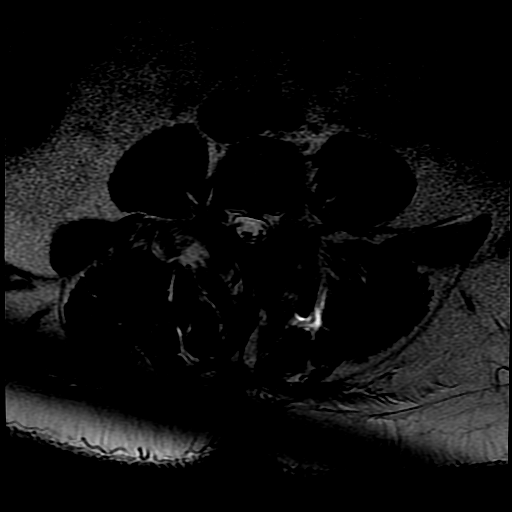
[im 18/32]
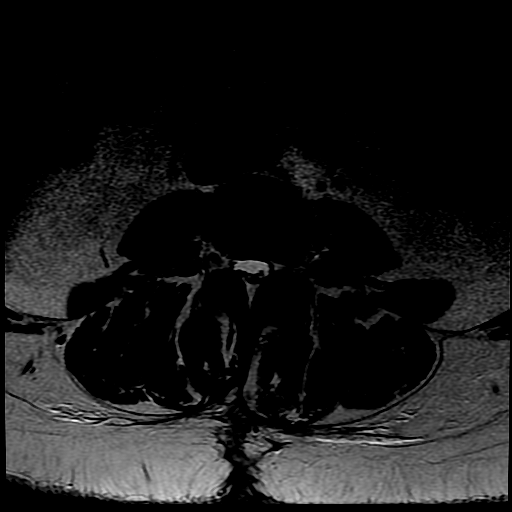
[im 21/32]
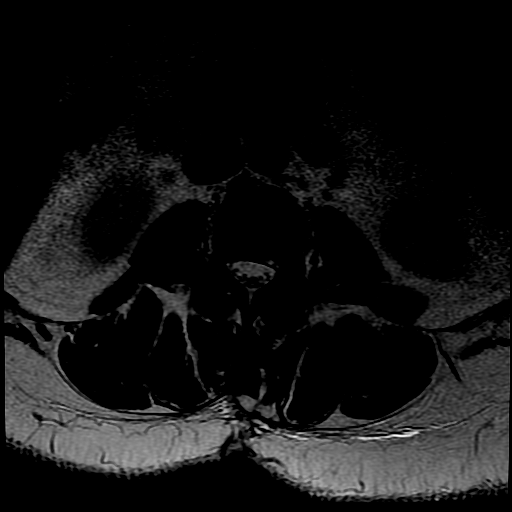
[im 28/32]
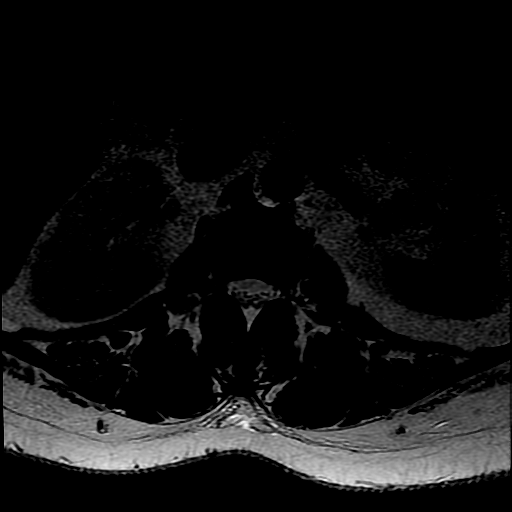

[18 of 48 positions shown; findings below may reference images not displayed]

FINDINGS: The patient has undergone previous posterior and interbody fusion at
L5-S1. There is a probable pseudarthrosis at the fusion level. There
is no definite bony bridging across the interspace, there is disc
material in the spinal canal, and there are endplate reactive
changes above and below the L5-S1 level. There is mild postcontrast
enhancement of the disc space and endplates without features to
strongly suggest infection, more likely continued edema due to prior
surgery.

In the LEFT lateral recess, extending dorsal and lateral to the
thecal sac, originating at the L5-S1 level just lateral to the S1
nerve root sleeve, there is a T2 hyperintense 21 x 29 x 25 mm fluid
collection. Slight peripheral enhancement is nonspecific. Slight
mass effect on the thecal sac and LEFT S1 nerve root. Considerations
include seroma, contained CSF leak, or abscess.

Chronic disc space narrowing at L4-5 without impingement. Moderate
facet ligamentum flavum hypertrophy narrow the lateral recess at
this level but not the foramina.

Unremarkable appearing L1-2, L2-3, and L3-4 disc spaces lower lumbar
facet arthropathy is redemonstrated at the L3-4 level.

Unremarkable retroperitoneal structures. Mild edema laterally in the
sacrum at S1 could suggest loosening of the screws. Consider CT
lumbar spine without contrast for further evaluation.
IMPRESSION: Probable pseudarthrosis at L5-S1. Consider CT lumbar spine without
contrast for further evaluation.

21 x 29 x 25 mm fluid collection emanating from the spinal canal at
L5-S1 extending dorsally on the LEFT. Considerations include seroma,
contains CSF leak, or abscess.

Stable chronic changes at L4-5 and L3-4 of spondylosis.

## 2016-06-19 ENCOUNTER — Ambulatory Visit (HOSPITAL_BASED_OUTPATIENT_CLINIC_OR_DEPARTMENT_OTHER): Payer: Medicaid Other | Attending: Internal Medicine | Admitting: Internal Medicine

## 2016-06-19 VITALS — Ht 62.0 in | Wt 209.0 lb

## 2016-06-19 DIAGNOSIS — G4733 Obstructive sleep apnea (adult) (pediatric): Secondary | ICD-10-CM | POA: Insufficient documentation

## 2016-06-19 DIAGNOSIS — R0989 Other specified symptoms and signs involving the circulatory and respiratory systems: Secondary | ICD-10-CM | POA: Diagnosis not present

## 2016-06-19 DIAGNOSIS — R0683 Snoring: Secondary | ICD-10-CM | POA: Insufficient documentation

## 2016-06-19 DIAGNOSIS — R0609 Other forms of dyspnea: Secondary | ICD-10-CM | POA: Diagnosis not present

## 2016-06-24 ENCOUNTER — Emergency Department (HOSPITAL_COMMUNITY)
Admission: EM | Admit: 2016-06-24 | Discharge: 2016-06-24 | Disposition: A | Discharge status: Home / Self Care | Payer: Medicaid Other | Attending: Emergency Medicine | Admitting: Emergency Medicine

## 2016-06-24 ENCOUNTER — Encounter (HOSPITAL_COMMUNITY): Payer: Self-pay

## 2016-06-24 ENCOUNTER — Emergency Department (HOSPITAL_COMMUNITY): Payer: Medicaid Other

## 2016-06-24 DIAGNOSIS — I5032 Chronic diastolic (congestive) heart failure: Secondary | ICD-10-CM | POA: Insufficient documentation

## 2016-06-24 DIAGNOSIS — I110 Hypertensive heart disease with heart failure: Secondary | ICD-10-CM | POA: Diagnosis not present

## 2016-06-24 DIAGNOSIS — R109 Unspecified abdominal pain: Secondary | ICD-10-CM

## 2016-06-24 DIAGNOSIS — F17210 Nicotine dependence, cigarettes, uncomplicated: Secondary | ICD-10-CM | POA: Insufficient documentation

## 2016-06-24 DIAGNOSIS — N390 Urinary tract infection, site not specified: Secondary | ICD-10-CM | POA: Diagnosis not present

## 2016-06-24 DIAGNOSIS — Z79899 Other long term (current) drug therapy: Secondary | ICD-10-CM | POA: Diagnosis not present

## 2016-06-24 DIAGNOSIS — J45909 Unspecified asthma, uncomplicated: Secondary | ICD-10-CM | POA: Insufficient documentation

## 2016-06-24 LAB — CBC WITH DIFFERENTIAL/PLATELET
Basophils Absolute: 0 10*3/uL (ref 0.0–0.1)
Basophils Relative: 0 %
Eosinophils Absolute: 0.4 10*3/uL (ref 0.0–0.7)
Eosinophils Relative: 2 %
HCT: 40.3 % (ref 36.0–46.0)
Hemoglobin: 13.2 g/dL (ref 12.0–15.0)
Lymphocytes Relative: 15 %
Lymphs Abs: 2.2 10*3/uL (ref 0.7–4.0)
MCH: 30.5 pg (ref 26.0–34.0)
MCHC: 32.8 g/dL (ref 30.0–36.0)
MCV: 93.1 fL (ref 78.0–100.0)
Monocytes Absolute: 1.1 10*3/uL — ABNORMAL HIGH (ref 0.1–1.0)
Monocytes Relative: 7 %
Neutro Abs: 10.9 10*3/uL — ABNORMAL HIGH (ref 1.7–7.7)
Neutrophils Relative %: 76 %
Platelets: 267 10*3/uL (ref 150–400)
RBC: 4.33 MIL/uL (ref 3.87–5.11)
RDW: 13.4 % (ref 11.5–15.5)
WBC: 14.5 10*3/uL — ABNORMAL HIGH (ref 4.0–10.5)

## 2016-06-24 LAB — COMPREHENSIVE METABOLIC PANEL
ALT: 21 U/L (ref 14–54)
AST: 11 U/L — ABNORMAL LOW (ref 15–41)
Albumin: 3.8 g/dL (ref 3.5–5.0)
Alkaline Phosphatase: 66 U/L (ref 38–126)
Anion gap: 7 (ref 5–15)
BUN: 21 mg/dL — ABNORMAL HIGH (ref 6–20)
CO2: 26 mmol/L (ref 22–32)
Calcium: 9.3 mg/dL (ref 8.9–10.3)
Chloride: 106 mmol/L (ref 101–111)
Creatinine, Ser: 0.69 mg/dL (ref 0.44–1.00)
GFR calc Af Amer: >60 mL/min (ref >60)
GFR calc non Af Amer: >60 mL/min (ref >60)
Glucose, Bld: 98 mg/dL (ref 65–99)
Potassium: 4 mmol/L (ref 3.5–5.1)
Sodium: 139 mmol/L (ref 135–145)
Total Bilirubin: 0.5 mg/dL (ref 0.3–1.2)
Total Protein: 7.7 g/dL (ref 6.5–8.1)

## 2016-06-24 LAB — I-STAT BETA HCG BLOOD, ED (MC, WL, AP ONLY): I-stat hCG, quantitative: <5 m[IU]/mL (ref <5)

## 2016-06-24 LAB — URINE MICROSCOPIC-ADD ON

## 2016-06-24 LAB — URINALYSIS, ROUTINE W REFLEX MICROSCOPIC
Glucose, UA: NEGATIVE mg/dL
Hgb urine dipstick: NEGATIVE
Ketones, ur: NEGATIVE mg/dL
Nitrite: NEGATIVE
Protein, ur: NEGATIVE mg/dL
Specific Gravity, Urine: 1.015 (ref 1.005–1.030)
pH: 5.5 (ref 5.0–8.0)

## 2016-06-24 MED ORDER — SODIUM CHLORIDE 0.9 % IV BOLUS (SEPSIS)
500.0000 mL | Freq: Once | INTRAVENOUS | Status: AC
  Administered 2016-06-24: 500 mL via INTRAVENOUS

## 2016-06-24 MED ORDER — CEPHALEXIN 500 MG PO CAPS: 500.0000 mg | ORAL_CAPSULE | Freq: Two times a day (BID) | ORAL | 0 refills | Status: DC

## 2016-06-24 MED ORDER — TRAMADOL HCL 50 MG PO TABS: 50.0000 mg | ORAL_TABLET | Freq: Four times a day (QID) | ORAL | 0 refills | Status: DC | PRN

## 2016-06-24 MED ORDER — MORPHINE SULFATE (PF) 4 MG/ML IV SOLN
4.0000 mg | Freq: Once | INTRAVENOUS | Status: AC
  Administered 2016-06-24: 4 mg via INTRAVENOUS
  Filled 2016-06-24: qty 1

## 2016-06-24 MED ORDER — ONDANSETRON HCL 4 MG/2ML IJ SOLN
4.0000 mg | Freq: Once | INTRAMUSCULAR | Status: AC
  Administered 2016-06-24: 4 mg via INTRAVENOUS
  Filled 2016-06-24: qty 2

## 2016-06-24 NOTE — ED Provider Notes (Signed)
[facility] DEPT Provider Note   CSN: [id number] Arrival date & time: 06/24/16  [id number]     History   Chief Complaint Chief Complaint  Patient presents with  . Flank Pain    HPI Lori Bean is a 45 y.o. female.  Patient is a 45 year old female with a history of bipolar disorder, CHF, hypertension and chronic bronchitis. She presents with abdominal and flank pain. She states she's had a 2 day history of waxing and waning pain that starts in her right mid back and radiates to her right lower abdomen. She states it's been constant since this morning. It's been worsening in nature. She denies any nausea or vomiting. No urinary symptoms. No hematuria. No known fevers. She denies any recent trauma to the area. It is worse with movement and breathing. She denies any cough or chest congestion. She has chronic shortness of breath which is unchanged from her baseline. No leg pain or swelling. No history of kidney stones. She is status post cholecystectomy.    Flank Pain  Associated symptoms include abdominal pain. Pertinent negatives include no chest pain, no headaches and no shortness of breath.    Past Medical History:  Diagnosis Date  . Anginal pain ([place])    occ from asthma  . Anxiety   . Arthritis   . Asthma   . Bipolar affective disorder ([place])    Schizophrenia  . Bronchitis    hx of  . CHF (congestive heart failure) ([place])   . Depression   . GERD (gastroesophageal reflux disease)   . Heart murmur   . Hypertension   . Overactive bladder   . Schizophrenia ([place])   . Sciatica   . Shortness of breath   . Snoring disorder    Pt stated my boyfriend always wakes me up and tells me to breathe    Patient Active Problem List   Diagnosis Date Noted  . Chronic diastolic CHF (congestive heart failure) ([place]) 04/22/2016  . Prediabetes 04/22/2016  . Tobacco abuse disorder 04/03/2014  . Obesity (BMI 30-39.9) 04/03/2014  . Chest pain 04/03/2014  . Mild cardiomegaly  02/21/2014  . Lumbar spondylosis 12/28/2013  . Extrinsic asthma, unspecified 06/07/2013  . Anxiety   . Hypertension   . Bipolar affective disorder ([place])   . GERD (gastroesophageal reflux disease)   . Arthritis   . Schizophrenia ([place])   . Overactive bladder     Past Surgical History:  Procedure Laterality Date  . BACK SURGERY     3 back surgeries  . CHOLECYSTECTOMY    . COLONOSCOPY WITH PROPOFOL N/A 01/23/2016   Procedure: COLONOSCOPY WITH PROPOFOL;  Surgeon: Wonda Horner, MD;  Location: [facility];  Service: Endoscopy;  Laterality: N/A;  . EYE SURGERY     Metal plate in right eye  . gallstones reomved    . LUMBAR LAMINECTOMY/DECOMPRESSION MICRODISCECTOMY  04/01/2012   Procedure: LUMBAR LAMINECTOMY/DECOMPRESSION MICRODISCECTOMY 2 LEVELS;  Surgeon: Faythe Ghee, MD;  Location: [facility];  Service: Neurosurgery;  Laterality: Left;  Lumbar four-five, lumbar five sacral one microdiscectomy   . LUMBAR WOUND DEBRIDEMENT  04/29/2012   Procedure: LUMBAR WOUND DEBRIDEMENT;  Surgeon: Faythe Ghee, MD;  Location: [facility]UR[facility] ORS;  Service: Neurosurgery;  Laterality: N/A;  lumbar wound debridement  . ROTATOR CUFF REPAIR     Right shoulder  . TUBAL LIGATION      OB History    No data available       Home Medications    Prior  to Admission medications   Medication Sig Start Date End Date Taking? Authorizing Provider  albuterol (PROAIR HFA) 108 (90 Base) MCG/ACT inhaler INHALE TWO PUFFS INTO THE LUNGS EVERY SIX HOURS AS NEEDED FOR WHEEZING OR SHORTNESS OF BREATH Patient taking differently: Inhale 2 puffs into the lungs every 6 (six) hours as needed for wheezing or shortness of breath.  11/07/15  Yes Lance Bosch, NP  amLODipine (NORVASC) 10 MG tablet Take 1 tablet (10 mg total) by mouth daily. Must have office visit for refills 03/31/16  Yes Olugbemiga E Doreene Burke, MD  furosemide (LASIX) 80 MG tablet Take 80 mg by mouth 2 (two) times daily.   Yes Historical Provider, MD  ibuprofen  (ADVIL,MOTRIN) 600 MG tablet Take 1 tablet (600 mg total) by mouth every 6 (six) hours as needed. Patient taking differently: Take 600 mg by mouth every 6 (six) hours as needed for moderate pain.  04/07/16  Yes Montine Circle, PA-C  triamcinolone cream (KENALOG) 0.1 % Apply 1 application topically 2 (two) times daily as needed (foot rash). 11/07/15  Yes Lance Bosch, NP  ARIPiprazole (ABILIFY) 20 MG tablet Take 1 tablet (20 mg total) by mouth at bedtime. Patient not taking: Reported on 06/24/2016 03/04/15   Lance Bosch, NP  cephALEXin (KEFLEX) 500 MG capsule Take 1 capsule (500 mg total) by mouth 2 (two) times daily. 06/24/16   Malvin Johns, MD  chlorhexidine (PERIDEX) 0.12 % solution Use as directed 15 mLs in the mouth or throat 3 (three) times daily. Patient not taking: Reported on 06/24/2016 07/26/15   Billy Fischer, MD  gabapentin (NEURONTIN) 300 MG capsule TAKE ONE (1) CAPSULE BY MOUTH 2 TIMES DAILY Patient not taking: Reported on 06/24/2016 01/22/15   Lance Bosch, NP  HYDROcodone-acetaminophen (NORCO/VICODIN) 5-325 MG tablet Take 2 tablets by mouth every 4 (four) hours as needed. Patient not taking: Reported on 06/24/2016 02/26/16   Nona Dell, PA-C  meloxicam (MOBIC) 7.5 MG tablet Take 2 tablets (15 mg total) by mouth daily. Patient not taking: Reported on 06/24/2016 03/09/16   Larene Pickett, PA-C  potassium chloride 20 MEQ TBCR Take 20 mEq by mouth 2 (two) times daily. Patient not taking: Reported on 06/24/2016 05/26/15   Eileen Stanford, PA-C  traMADol (ULTRAM) 50 MG tablet Take 1 tablet (50 mg total) by mouth every 6 (six) hours as needed. 06/24/16   Malvin Johns, MD    Family History Family History  Problem Relation Age of Onset  . Diabetes Mother   . Hypertension Mother   . Diabetes Father   . Heart disease Paternal Aunt   . Cancer Paternal Aunt     Social History Social History  Substance Use Topics  . Smoking status: Current Every Day Smoker     Packs/day: 0.25    Years: 9.00    Types: Cigarettes    Last attempt to quit: 05/23/2014  . Smokeless tobacco: Never Used  . Alcohol use No     Comment: quit Nov. 2014     Allergies   Aspirin   Review of Systems Review of Systems  Constitutional: Negative for chills, diaphoresis, fatigue and fever.  HENT: Negative for congestion, rhinorrhea and sneezing.   Eyes: Negative.   Respiratory: Negative for cough, chest tightness and shortness of breath.   Cardiovascular: Negative for chest pain and leg swelling.  Gastrointestinal: Positive for abdominal pain. Negative for blood in stool, diarrhea, nausea and vomiting.  Genitourinary: Positive for flank pain. Negative for difficulty  urinating, frequency and hematuria.  Musculoskeletal: Negative for arthralgias and back pain.  Skin: Negative for rash.  Neurological: Negative for dizziness, speech difficulty, weakness, numbness and headaches.     Physical Exam Updated Vital Signs BP 131/71   Pulse 80   Temp 98 F (36.7 C) (Oral)   Resp 18   LMP 05/25/2016   SpO2 95%   Physical Exam  Constitutional: She is oriented to person, place, and time. She appears well-developed and well-nourished.  HENT:  Head: Normocephalic and atraumatic.  Eyes: Pupils are equal, round, and reactive to light.  Neck: Normal range of motion. Neck supple.  Cardiovascular: Normal rate, regular rhythm and normal heart sounds.   Pulmonary/Chest: Effort normal and breath sounds normal. No respiratory distress. She has no wheezes. She has no rales. She exhibits no tenderness.  Abdominal: Soft. Bowel sounds are normal. There is tenderness (Moderate tenderness to the right mid and lower abdomen as well as the right mid back.). There is no rebound and no guarding.  Musculoskeletal: Normal range of motion. She exhibits no edema.  Lymphadenopathy:    She has no cervical adenopathy.  Neurological: She is alert and oriented to person, place, and time.  Skin: Skin is  warm and dry. No rash noted.  Psychiatric: She has a normal mood and affect.     ED Treatments / Results  Labs (all labs ordered are listed, but only abnormal results are displayed) Labs Reviewed  URINALYSIS, ROUTINE W REFLEX MICROSCOPIC (NOT AT [facility]) - Abnormal; Notable for the following:       Result Value   Bilirubin Urine SMALL (*)    Leukocytes, UA TRACE (*)    All other components within normal limits  URINE MICROSCOPIC-ADD ON - Abnormal; Notable for the following:    Squamous Epithelial / LPF 0-5 (*)    Bacteria, UA RARE (*)    All other components within normal limits  COMPREHENSIVE METABOLIC PANEL - Abnormal; Notable for the following:    BUN 21 (*)    AST 11 (*)    All other components within normal limits  CBC WITH DIFFERENTIAL/PLATELET - Abnormal; Notable for the following:    WBC 14.5 (*)    Neutro Abs 10.9 (*)    Monocytes Absolute 1.1 (*)    All other components within normal limits  I-STAT BETA HCG BLOOD, ED ([facility], [facility], [facility] ONLY)    EKG  EKG Interpretation None       Radiology Ct Renal Stone Study  Result Date: 06/24/2016 CLINICAL DATA:  Right flank pain for 3 days EXAM: CT ABDOMEN AND PELVIS WITHOUT CONTRAST TECHNIQUE: Multidetector CT imaging of the abdomen and pelvis was performed following the standard protocol without oral or intravenous contrast material administration. COMPARISON:  April 08, 2015 FINDINGS: Lower chest: There is scarring in both lung bases, stable. Hepatobiliary: Liver measures 18.9 cm in length. No focal liver lesions are apparent on this noncontrast enhanced study. Gallbladder is absent. There is no biliary duct dilatation. Pancreas: There is no pancreatic mass or inflammatory focus. Spleen: No splenic lesions are evident. Adrenals/Urinary Tract: There is adrenal hypertrophy bilaterally, more notable on the left than on the right, stable. The appearance of the adrenals is stable compared to the previous study. Kidneys bilaterally show no  evident mass or hydronephrosis on either side. There is no renal or ureteral calculus on either side. Urinary bladder is midline with wall thickness within normal limits. Stomach/Bowel: There are multiple sigmoid diverticula without diverticulitis. There are less  frequent diverticulum in the ascending colon, again without diverticulitis. There is no appreciable bowel wall or mesenteric thickening. There is no bowel obstruction. No free air or portal venous air. Vascular/Lymphatic: There is no abdominal aortic aneurysm. No vascular lesions are evident on this noncontrast enhanced study. There is no appreciable adenopathy in the abdomen or pelvis. Reproductive: Uterus is anteverted. There is no pelvic mass or pelvic fluid collection. Other: The appendix appears unremarkable. There is no abscess or ascites in the abdomen or pelvis. There is fat in the left inguinal ring. Musculoskeletal: There is postoperative change in the lower lumbar region. There are no blastic or lytic bone lesions. There is degenerative change in the lower lumbar spine region. There is no intramuscular or abdominal wall lesion. IMPRESSION: No renal or ureteral calculi.  No hydronephrosis. Multiple colonic diverticula without diverticulitis. No bowel obstruction. No abscess. Appendix appears normal. Postoperative change lower lumbar spine with degenerative change in the lower lumbar spine. Prominent liver without focal lesion on this noncontrast enhanced study. Electronically Signed   By: Lowella Grip III M.D.   On: 06/24/2016 13:34    Procedures Procedures (including critical care time)  Medications Ordered in ED Medications  morphine 4 MG/ML injection 4 mg (4 mg Intravenous Given 06/24/16 1250)  ondansetron (ZOFRAN) injection 4 mg (4 mg Intravenous Given 06/24/16 1249)  sodium chloride 0.9 % bolus 500 mL (0 mLs Intravenous Stopped 06/24/16 1341)     Initial Impression / Assessment and Plan / ED Course  I have reviewed the  triage vital signs and the nursing notes.  Pertinent labs & imaging results that were available during my care of the patient were reviewed by me and considered in my medical decision making (see chart for details).  Clinical Course    Patient presents with right flank pain. There is no evidence of kidney stone. She is status post cholecystectomy and her pain is mostly lower.  There is no other evidence of infection on CT. No evidence of colitis. Her symptoms could likely be musculoskeletal although she did have some signs of a UTI on her urinalysis. She's having urinary frequency. Given this, I will treat her with antibiotics. She has no shortness of breath respiratory symptoms or other concerns for pulmonary embolus. She was discharged home in good condition. She was advised to follow-up with her PCP if her symptoms are not improving or return here as needed for any worsening symptoms. She was started on Keflex and tramadol.  Final Clinical Impressions(s) / ED Diagnoses   Final diagnoses:  Flank pain  Urinary tract infection without hematuria, site unspecified    New Prescriptions New Prescriptions   CEPHALEXIN (KEFLEX) 500 MG CAPSULE    Take 1 capsule (500 mg total) by mouth 2 (two) times daily.   TRAMADOL (ULTRAM) 50 MG TABLET    Take 1 tablet (50 mg total) by mouth every 6 (six) hours as needed.     Malvin Johns, MD 06/24/16 1444

## 2016-06-24 NOTE — ED Triage Notes (Signed)
Pt here with rt flank pain. Started last night.  No urinary symptoms.  No trauma.

## 2016-06-24 NOTE — ED Notes (Signed)
Discharge instructions, follow up care, and rx x2 reviewed with patient. Patient verbalized understanding. 

## 2016-06-24 NOTE — ED Notes (Signed)
Patient transported to CT 

## 2016-06-27 NOTE — Procedures (Signed)
Patient Name: Lori Bean, Lori Bean Date: 06/19/2016 Gender: Female D.O.B: 1970/10/31 Age (years): 45 Referring Provider: Doristine Section Bonsu Height (inches): 62 Interpreting Physician: Baird Lyons MD, ABSM Weight (lbs): 209 RPSGT: Gerhard Perches BMI: 38 MRN: [id number] Neck Size: 16.00 CLINICAL INFORMATION Sleep Study Type: Split Night CPAP Indication for sleep study: OSA Epworth Sleepiness Score: 18  SLEEP STUDY TECHNIQUE As per the AASM Manual for the Scoring of Sleep and Associated Events v2.3 (April 2016) with a hypopnea requiring 4% desaturations. The channels recorded and monitored were frontal, central and occipital EEG, electrooculogram (EOG), submentalis EMG (chin), nasal and oral airflow, thoracic and abdominal wall motion, anterior tibialis EMG, snore microphone, electrocardiogram, and pulse oximetry. Continuous positive airway pressure (CPAP) was initiated when the patient met split night criteria and was titrated according to treat sleep-disordered breathing.  MEDICATIONS Medications self-administered by patient taken the night of the study : none reported  RESPIRATORY PARAMETERS Diagnostic Total AHI (/hr): 26.1 RDI (/hr): 39.0 OA Index (/hr): 0.4 CA Index (/hr): 0.0 REM AHI (/hr): 32.0 NREM AHI (/hr): 25.1 Supine AHI (/hr): 49.4 Non-supine AHI (/hr): 20.88 Min O2 Sat (%): 87.00 Mean O2 (%): 94.48 Time below 88% (min): 0.9   Titration Optimal Pressure (cm): 14 AHI at Optimal Pressure (/hr): 0.0 Min O2 at Optimal Pressure (%): 94.0 Supine % at Optimal (%): 100 Sleep % at Optimal (%): 89    SLEEP ARCHITECTURE The recording time for the entire night was 366.1 minutes. During a baseline period of 179.0 minutes, the patient slept for 158.5 minutes in REM and nonREM, yielding a sleep efficiency of 88.5%. Sleep onset after lights out was 3.5 minutes with a REM latency of 56.0 minutes. The patient spent 3.79% of the night in stage N1 sleep, 82.02% in stage N2  sleep, 0.00% in stage N3 and 14.20% in REM. During the titration period of 185.3 minutes, the patient slept for 154.0 minutes in REM and nonREM, yielding a sleep efficiency of 83.1%. Sleep onset after CPAP initiation was 5.7 minutes with a REM latency of 70.0 minutes. The patient spent 5.52% of the night in stage N1 sleep, 75.00% in stage N2 sleep, 0.00% in stage N3 and 19.48% in REM.  CARDIAC DATA The 2 lead EKG demonstrated sinus rhythm. The mean heart rate was N/A beats per minute. Other EKG findings include: None.  LEG MOVEMENT DATA The total Periodic Limb Movements of Sleep (PLMS) were 0. The PLMS index was 0.00 .  IMPRESSIONS - Moderate obstructive sleep apnea occurred during the diagnostic portion of the study(AHI = 26.1/hour). An optimal PAP pressure was selected for this patient ( 14 cm of water) - No significant central sleep apnea occurred during the diagnostic portion of the study (CAI = 0.0/hour). - Mild oxygen desaturation was noted during the diagnostic portion of the study (Min O2 = 87.00%). - The patient snored with Moderate snoring volume during the diagnostic portion of the study. - No cardiac abnormalities were noted during this study. - Clinically significant periodic limb movements did not occur during sleep.  DIAGNOSIS - Obstructive Sleep Apnea (327.23 [G47.33 ICD-10])  RECOMMENDATIONS - Trial of CPAP therapy on 14 cm H2O with a Small size Fisher&Paykel Full Face Mask Simplus mask and heated humidification. - Avoid alcohol, sedatives and other CNS depressants that may worsen sleep apnea and disrupt normal sleep architecture. - Sleep hygiene should be reviewed to assess factors that may improve sleep quality. - Weight management and regular exercise should be initiated or continued.  [Electronically signed] 06/27/2016  12:19 PM  Baird Lyons MD, [place], American Board of Sleep Medicine   NPI: [id number]  [place], American Board of  Sleep Medicine  ELECTRONICALLY SIGNED ON:  06/27/2016, 12:18 PM [place] PH: [phone]   FX: [phone] [place]

## 2016-09-09 ENCOUNTER — Telehealth: Payer: Self-pay | Admitting: *Deleted

## 2016-09-09 NOTE — Telephone Encounter (Signed)
WILL DEFER TO DR [facility]   Request for surgical clearance:  1. What type of surgery is being performed? RIGHT KNEE ARTHROSCOPY  2. When is this surgery scheduled? TBA  3. Are there any medications that need to be held prior to surgery and how long?  4. Name of physician performing surgery? DR Jenny Reichmann GRAVES  5. What is your office phone and fax number? [place] [address]  [place],  [place] [phone]

## 2016-09-12 NOTE — Telephone Encounter (Signed)
Low risk for surgery. 

## 2016-09-15 NOTE — Telephone Encounter (Signed)
Faxed via epic.

## 2016-09-17 ENCOUNTER — Other Ambulatory Visit: Payer: Self-pay | Admitting: Orthopedic Surgery

## 2016-09-17 NOTE — Progress Notes (Signed)
Cardiology Office Note   Date:  09/18/2016   ID:  Lori Bean, DOB 18-Feb-1971, MRN [id number]  PCP:  Benito Mccreedy, MD  Cardiologist:   [name], MD   Chief Complaint  Patient presents with  . Follow-up    sob, Chest pain      Patient ID: Lori Bean is a 46 y.o. female with chronic diastolic heart failure, hypertensive heart disease, OSA, schizophrenia, bipolar disorder, obesity and prior PE who presents for follow up.  [name] was initially seen 05/2015 with fatigue and shortness of breath.  At the time she endorsed heart failure symptoms, including lower extremity edema, orthopnea and shortness of breath.  She also reported atypical chest pain.  After that appointment she was referred to the hospital and had a [place] 05/26/15 that was negative for ischemia and revealed LVEF 57%.  She had an echo 05/24/15 with LVEF 60-65% and grade 2 diastolic dysfunction.  She was switched from lasix to bumex with improvement in her edema.  On CT scan [name] was incidentally noted to have adrenal hyperplasia. Her blood pressure has been well-controlled on one antihypertensive. She has intermittently had mild hypokalemia.   Since her last appointment [name] was diagnosed with sleep apnea.  She will be going for a fitting on 1/10.  She reports shortness of breath, productive cough and wheezing that started one week ago.  She notes that she is short of breath when walking around her house.  He cough is productive of dark sputum.  She has been taking her albuterol inhaler several times daily, which helps somewhat.  She also endorses lower extremity edema, four pillow orthopnea and PND.  She has not been exercising or following any salt restrictions.  She reports a variable response to lasix.   Past Medical History:  Diagnosis Date  . Anginal pain ([place])    occ from asthma  . Anxiety   . Arthritis   . Asthma   . Bipolar affective disorder ([place])      Schizophrenia  . Bronchitis    hx of  . CHF (congestive heart failure) ([place])   . Depression   . GERD (gastroesophageal reflux disease)   . Heart murmur   . Hypertension   . Overactive bladder   . Schizophrenia ([place])   . Sciatica   . Shortness of breath   . Snoring disorder    Pt stated my boyfriend always wakes me up and tells me to breathe    Past Surgical History:  Procedure Laterality Date  . BACK SURGERY     3 back surgeries  . CHOLECYSTECTOMY    . COLONOSCOPY WITH PROPOFOL N/A 01/23/2016   Procedure: COLONOSCOPY WITH PROPOFOL;  Surgeon: Wonda Horner, MD;  Location: [facility];  Service: Endoscopy;  Laterality: N/A;  . EYE SURGERY     Metal plate in right eye  . gallstones reomved    . LUMBAR LAMINECTOMY/DECOMPRESSION MICRODISCECTOMY  04/01/2012   Procedure: LUMBAR LAMINECTOMY/DECOMPRESSION MICRODISCECTOMY 2 LEVELS;  Surgeon: Faythe Ghee, MD;  Location: [facility];  Service: Neurosurgery;  Laterality: Left;  Lumbar four-five, lumbar five sacral one microdiscectomy   . LUMBAR WOUND DEBRIDEMENT  04/29/2012   Procedure: LUMBAR WOUND DEBRIDEMENT;  Surgeon: Faythe Ghee, MD;  Location: [facility];  Service: Neurosurgery;  Laterality: N/A;  lumbar wound debridement  . ROTATOR CUFF REPAIR     Right shoulder  . TUBAL LIGATION       Current Outpatient  Prescriptions  Medication Sig Dispense Refill  . albuterol (PROAIR HFA) 108 (90 Base) MCG/ACT inhaler INHALE TWO PUFFS INTO THE LUNGS EVERY SIX HOURS AS NEEDED FOR WHEEZING OR SHORTNESS OF BREATH (Patient taking differently: Inhale 2 puffs into the lungs every 6 (six) hours as needed for wheezing or shortness of breath. ) 8.5 g 3  . amLODipine (NORVASC) 10 MG tablet Take 1 tablet (10 mg total) by mouth daily. Must have office visit for refills 30 tablet 0  . ARIPiprazole (ABILIFY) 20 MG tablet Take 1 tablet (20 mg total) by mouth at bedtime. 30 tablet 0  . chlorhexidine (PERIDEX) 0.12 % solution Use as directed 15 mLs  in the mouth or throat 3 (three) times daily. 120 mL 1  . furosemide (LASIX) 80 MG tablet Take 80 mg by mouth 2 (two) times daily.    [name] gabapentin (NEURONTIN) 300 MG capsule TAKE ONE (1) CAPSULE BY MOUTH 2 TIMES DAILY 60 capsule 0  . meloxicam (MOBIC) 7.5 MG tablet Take 2 tablets (15 mg total) by mouth daily. 30 tablet 0  . potassium chloride 20 MEQ TBCR Take 20 mEq by mouth 2 (two) times daily. 60 tablet 2  . traMADol (ULTRAM) 50 MG tablet Take 1 tablet (50 mg total) by mouth every 6 (six) hours as needed. 15 tablet 0  . triamcinolone cream (KENALOG) 0.1 % Apply 1 application topically 2 (two) times daily as needed (foot rash). 30 g 2  . azithromycin (ZITHROMAX) 250 MG tablet 2 TABLETS TODAY AND THEN ONE DAILY 6 each 0  . lisinopril (PRINIVIL,ZESTRIL) 20 MG tablet Take 1 tablet (20 mg total) by mouth daily. 30 tablet 5  . predniSONE (DELTASONE) 20 MG tablet 2 TABLETS BY MOUTH DAILY 10 tablet 0   Current Facility-Administered Medications  Medication Dose Route Frequency Provider Last Rate Last Dose  . nitroGLYCERIN (NITROSTAT) SL tablet 0.4 mg  0.4 mg Sublingual Q5 min PRN [name], MD   0.4 mg at 05/24/15 0850    Allergies:   Aspirin    Social History:  The patient  reports that she has been smoking Cigarettes.  She has a 2.25 pack-year smoking history. She has never used smokeless tobacco. She reports that she does not drink alcohol or use drugs.   Family History:  The patient's family history includes Cancer in her paternal aunt; Diabetes in her father and mother; Heart disease in her paternal aunt; Hypertension in her mother.    ROS:  Please see the history of present illness.   Otherwise, review of systems are positive for none.   All other systems are reviewed and negative.    PHYSICAL EXAM: VS:  BP (!) 168/100   Pulse 77   Ht 5\' 2"  (1.575 m)   Wt 109.4 kg (241 lb 3.2 oz)   BMI 44.12 kg/m  , BMI Body mass index is 44.12 kg/m. GENERAL:  Well appearing HEENT:  Pupils  equal round and reactive, fundi not visualized, oral mucosa unremarkable NECK:  No jugular venous distention, waveform within normal limits, carotid upstroke brisk and symmetric, no bruits LYMPHATICS:  No cervical adenopathy LUNGS:  Diffuse, mild expiratory wheezes,  No rhonchi or crackles.   Diminished air movement.  HEART:  RRR.  PMI not displaced or sustained,S1 and S2 within normal limits, no S3, no S4, no clicks, no rubs, no murmurs ABD:  Flat, positive bowel sounds normal in frequency in pitch, no bruits, no rebound, no guarding, no midline pulsatile mass, no hepatomegaly, no splenomegaly  EXT:  2 plus pulses throughout, no edema,  no cyanosis no clubbing SKIN:  No rashes no nodules NEURO:  Cranial nerves II through XII grossly intact, motor grossly intact throughout PSYCH:  Cognitively intact, oriented to person place and time  EKG:  EKG is ordered today. The ekg ordered 09/18/16 demonstrates sinus rhythm. Rate 77 bpm.  Non-specific T wave changes.    Echo 05/24/15: Study Conclusions  - Left ventricle: The cavity size was normal. Wall thickness was  increased in a pattern of mild LVH. Systolic function was normal.  The estimated ejection fraction was in the range of 60% to 65%.  Wall motion was normal; there were no regional wall motion  abnormalities. Features are consistent with a pseudonormal left  ventricular filling pattern, with concomitant abnormal relaxation  and increased filling pressure (grade 2 diastolic dysfunction).  Recent Labs: 12/23/2015: B Natriuretic Peptide 7.8 06/24/2016: ALT 21; BUN 21; Creatinine, Ser 0.69; Hemoglobin 13.2; Platelets 267; Potassium 4.0; Sodium 139    Lipid Panel    Component Value Date/Time   CHOL 133 05/25/2015 0333   TRIG 105 05/25/2015 0333   HDL 50 05/25/2015 0333   CHOLHDL 2.7 05/25/2015 0333   VLDL 21 05/25/2015 0333   LDLCALC 62 05/25/2015 0333      Wt Readings from Last 3 Encounters:  09/18/16 109.4 kg (241 lb 3.2 oz)    06/19/16 94.8 kg (209 lb)  05/15/16 97.1 kg (214 lb)     Other studies Reviewed: Additional studies/ records that were reviewed today include: . Review of the above records demonstrates:  Please see elsewhere in the note.     ASSESSMENT AND PLAN:  # Hypertensive heart disease:  Blood pressure is above goal.  Continue amlodipine and start lisinopril 20mg  daily.  We will check a BMP today.  # Chronic diastolic heart failure:  # Obesity: # Shortness of breath: # Asthma/COPD exacerbation: Ms. Nagai is euvolemic on exam today. I suspect that her shortness of breath is more related to asthma/COPD exacerbation than heart failure.  She does not seem to be in acute heart failure.  However, we will check a BNP to better assess.  It seems that she has a COPD/asthma exacerbation. We will give her Azithromycin 500mg  x1 followed by 250mg  daily x4 days.  Prednisone 40mg  daily x4 days.  I encouraged her to continue using her albuterol inhaler.  She will go for a CXR as well.  We also discussed the importance of limiting salt intake.  She did not realize that seasoning salt has salt in it.    # Chest pain: Likely musculoskeletal. Her chest pain is reproducible with palpation and she had a negative stress test.  # Tobacco abuse: Encouraged smoking cessation.  She has cut back to 1/2 ppd.  # Morbid Obesity: We discussed her weight gain and the importance of limiting calories and increasing her physical exercise.  Current medicines are reviewed at length with the patient today.  The patient does not have concerns regarding medicines.  The following changes have been made:  Start lisinopril 20mg  daily.   Labs/ tests ordered today include:    Orders Placed This Encounter  Procedures  . DG Chest 2 View  . Basic metabolic panel  . B Nat Peptide  . EKG 12-Lead     Disposition:  FU with Dr. Jonelle Sidle C. Tribbey in 3 months.  APP in 3 weeks.    Signed, [name], MD  09/18/2016 5:32 PM  [facility]

## 2016-09-18 ENCOUNTER — Encounter: Payer: Self-pay | Admitting: Cardiovascular Disease

## 2016-09-18 ENCOUNTER — Ambulatory Visit (INDEPENDENT_AMBULATORY_CARE_PROVIDER_SITE_OTHER): Payer: Medicaid Other | Admitting: Cardiovascular Disease

## 2016-09-18 VITALS — BP 168/100 | HR 77 | Ht 62.0 in | Wt 241.2 lb

## 2016-09-18 DIAGNOSIS — I5032 Chronic diastolic (congestive) heart failure: Secondary | ICD-10-CM

## 2016-09-18 DIAGNOSIS — J45901 Unspecified asthma with (acute) exacerbation: Secondary | ICD-10-CM

## 2016-09-18 DIAGNOSIS — J441 Chronic obstructive pulmonary disease with (acute) exacerbation: Secondary | ICD-10-CM | POA: Diagnosis not present

## 2016-09-18 DIAGNOSIS — I110 Hypertensive heart disease with heart failure: Secondary | ICD-10-CM | POA: Diagnosis not present

## 2016-09-18 DIAGNOSIS — Z720 Tobacco use: Secondary | ICD-10-CM

## 2016-09-18 DIAGNOSIS — R0602 Shortness of breath: Secondary | ICD-10-CM | POA: Diagnosis not present

## 2016-09-18 LAB — BASIC METABOLIC PANEL
BUN: 11 mg/dL (ref 7–25)
CO2: 29 mmol/L (ref 20–31)
Calcium: 9.1 mg/dL (ref 8.6–10.2)
Chloride: 101 mmol/L (ref 98–110)
Creat: 0.56 mg/dL (ref 0.50–1.10)
Glucose, Bld: 90 mg/dL (ref 65–99)
Potassium: 4.5 mmol/L (ref 3.5–5.3)
Sodium: 138 mmol/L (ref 135–146)

## 2016-09-18 MED ORDER — LISINOPRIL 20 MG PO TABS: 20.0000 mg | ORAL_TABLET | Freq: Every day | ORAL | 5 refills | Status: DC

## 2016-09-18 MED ORDER — PREDNISONE 20 MG PO TABS: ORAL_TABLET | ORAL | 0 refills | Status: DC

## 2016-09-18 MED ORDER — AZITHROMYCIN 250 MG PO TABS: ORAL_TABLET | ORAL | 0 refills | Status: DC

## 2016-09-18 NOTE — Patient Instructions (Addendum)
Medication Instructions:  START LISINOPRIL 20 MG DAILY  START ZPAK  2 TABLETS TODAY AND THEN ONE DAILY UNTIL FINISHED  START PREDNISONE 20 MG 2 DAILY FOR 5 DAYS  Labwork: BMET/BNP AT [facility] ON THE FIRST FLOOR  Testing/Procedures: A chest x-ray takes a picture of the organs and structures inside the chest, including the heart, lungs, and blood vessels. This test can show several things, including, whether the heart is enlarges; whether fluid is building up in the lungs; and whether pacemaker / defibrillator leads are still in place. [place]  IMAGING   Follow-Up: Your physician recommends that you schedule a follow-up appointment in: 3 WEEKS WITH PA  10/09/16 AT 8:00 WITH RHONDA B PA  Your physician wants you to follow-up in: [place] DR Tatum will receive a reminder letter in the mail two months in advance. If you don't receive a letter, please call our office to schedule the follow-up appointment.  If you need a refill on your cardiac medications before your next appointment, please call your pharmacy.

## 2016-09-19 LAB — BRAIN NATRIURETIC PEPTIDE: Brain Natriuretic Peptide: 15.2 pg/mL (ref <100)

## 2016-09-21 ENCOUNTER — Ambulatory Visit
Admission: RE | Admit: 2016-09-21 | Discharge: 2016-09-21 | Disposition: A | Discharge status: Home / Self Care | Payer: Medicaid Other | Source: Ambulatory Visit | Attending: Cardiovascular Disease | Admitting: Cardiovascular Disease

## 2016-09-21 DIAGNOSIS — R0602 Shortness of breath: Secondary | ICD-10-CM

## 2016-09-22 ENCOUNTER — Encounter (HOSPITAL_BASED_OUTPATIENT_CLINIC_OR_DEPARTMENT_OTHER): Payer: Self-pay | Admitting: *Deleted

## 2016-09-23 ENCOUNTER — Ambulatory Visit (INDEPENDENT_AMBULATORY_CARE_PROVIDER_SITE_OTHER): Payer: Medicaid Other | Admitting: Pulmonary Disease

## 2016-09-23 ENCOUNTER — Encounter: Payer: Self-pay | Admitting: Pulmonary Disease

## 2016-09-23 DIAGNOSIS — J4520 Mild intermittent asthma, uncomplicated: Secondary | ICD-10-CM

## 2016-09-23 DIAGNOSIS — G4733 Obstructive sleep apnea (adult) (pediatric): Secondary | ICD-10-CM | POA: Diagnosis not present

## 2016-09-23 NOTE — Assessment & Plan Note (Signed)
Prescription for CPAP 14 cm with small fullface mask, EPR 3, humidity, Download in 4 weeks  Weight loss encouraged, compliance with goal of at least 4-6 hrs every night is the expectation. Advised against medications with sedative side effects Cautioned against driving when sleepy - understanding that sleepiness will vary on a day to day basis

## 2016-09-23 NOTE — Patient Instructions (Signed)
Prescription for CPAP 14 cm with small fullface mask, EPR 3, humidity, Download in 4 weeks   Good luck with your knee surgery

## 2016-09-23 NOTE — Progress Notes (Signed)
Subjective:    Patient ID: Lori Bean, female    DOB: October 30, 1970, 46 y.o.   MRN: [id number]  HPI PCP - osei Bonsu  46 year old smoker referred for evaluation of obstructive sleep apnea. Her other medical Problems include hypertensive heart disease and chronic diastolic heart failure. She also has bipolar disorder and schizophrenia.  She reported witnessed apneas, non-refreshing sleep in numerous nocturnal awakenings and excessive daytime somnolence and tiredness. Epworth sleepiness score was 14. Polysomnogram 06/2016 showed moderate OSA with AHI of 26/hour with lowest desaturation of 87%. This was corrected by CPAP of 14 cm with a small fullface mask.  Bedtime is around 9 PM, she sleeps on her side with 4 pillows, sleep latency is generally minimal but sometimes can be prolonged. She reports 4-5 nocturnal awakenings including nocturia and is out of bed by 6 AM feeling tired with occasional dryness of mouth and headaches. She's gained about 40 pounds in the last 5 years. She likes her coffee and drinks 2-3 cups daily There is no history suggestive of cataplexy, sleep paralysis or parasomnias   She smoked about 20 pack years, down to about half pack per day, had a recent visit to her cardiologist she was noted to be wheezing and given CPAP for productive cough and a short prednisone taper images 3 more days left on the prednisone. She also carries a diagnosis of asthma and uses albuterol MDI about once a week  She is slated to have left knee surgery in 1 week.     Past Medical History:  Diagnosis Date  . Anginal pain ([place])    occ from asthma  . Anxiety   . Arthritis   . Asthma   . Bipolar affective disorder ([place])    Schizophrenia  . Bronchitis    hx of  . CHF (congestive heart failure) ([place])   . Depression   . GERD (gastroesophageal reflux disease)   . Heart murmur   . Hypertension   . Overactive bladder   . Schizophrenia ([place])   . Sciatica   . Shortness of  breath   . Snoring disorder    Pt stated my boyfriend always wakes me up and tells me to breathe    Past Surgical History:  Procedure Laterality Date  . BACK SURGERY     3 back surgeries  . CHOLECYSTECTOMY    . COLONOSCOPY WITH PROPOFOL N/A 01/23/2016   Procedure: COLONOSCOPY WITH PROPOFOL;  Surgeon: Wonda Horner, MD;  Location: [facility];  Service: Endoscopy;  Laterality: N/A;  . EYE SURGERY     Metal plate in right eye  . gallstones reomved    . LUMBAR LAMINECTOMY/DECOMPRESSION MICRODISCECTOMY  04/01/2012   Procedure: LUMBAR LAMINECTOMY/DECOMPRESSION MICRODISCECTOMY 2 LEVELS;  Surgeon: Faythe Ghee, MD;  Location: [facility];  Service: Neurosurgery;  Laterality: Left;  Lumbar four-five, lumbar five sacral one microdiscectomy   . LUMBAR WOUND DEBRIDEMENT  04/29/2012   Procedure: LUMBAR WOUND DEBRIDEMENT;  Surgeon: Faythe Ghee, MD;  Location: [place] [facility];  Service: Neurosurgery;  Laterality: N/A;  lumbar wound debridement  . ROTATOR CUFF REPAIR     Right shoulder  . TUBAL LIGATION     Allergies  Allergen Reactions  . Aspirin Nausea And Vomiting     Social History   Social History  . Marital status: Divorced    Spouse name: N/A  . Number of children: 2  . Years of education: 12   Occupational History  .  disabled   Social History Main Topics  . Smoking status: Current Every Day Smoker    Packs/day: 0.25    Years: 9.00    Types: Cigarettes    Last attempt to quit: 05/23/2014  . Smokeless tobacco: Never Used  . Alcohol use No     Comment: quit Nov. 2014  . Drug use: No  . Sexual activity: Not Currently    Birth control/ protection: Surgical   Other Topics Concern  . Not on file   Social History Narrative   Lives alone   Caffeine - coffee 1 cup daily     Family History  Problem Relation Age of Onset  . Diabetes Mother   . Hypertension Mother   . Diabetes Father   . Heart disease Paternal Aunt   . Cancer Paternal Aunt      Review of  Systems Constitutional: negative for anorexia, fevers and sweats  Eyes: negative for irritation, redness and visual disturbance  Ears, nose, mouth, throat, and face: negative for earaches, epistaxis, nasal congestion and sore throat  Respiratory: negative for cough, dyspnea on exertion, sputum and wheezing  Cardiovascular: negative for chest pain, dyspnea, lower extremity edema, orthopnea, palpitations and syncope  Gastrointestinal: negative for abdominal pain, constipation, diarrhea, melena, nausea and vomiting  Genitourinary:negative for dysuria, frequency and hematuria  Hematologic/lymphatic: negative for bleeding, easy bruising and lymphadenopathy  Musculoskeletal:negative for arthralgias, muscle weakness and stiff joints  Neurological: negative for coordination problems, gait problems, headaches and weakness  Endocrine: negative for diabetic symptoms including polydipsia, polyuria and weight loss     Objective:   Physical Exam  Gen. Pleasant, obese, in no distress, normal affect ENT - no lesions, no post nasal drip, class 2-3 airway Neck: No JVD, no thyromegaly, no carotid bruits Lungs: no use of accessory muscles, no dullness to percussion, decreased without rales or rhonchi  Cardiovascular: Rhythm regular, heart sounds  normal, no murmurs or gallops, 1+ peripheral edema Abdomen: soft and non-tender, no hepatosplenomegaly, BS normal. Musculoskeletal: No deformities, no cyanosis or clubbing Neuro:  alert, non focal, no tremors       Assessment & Plan:

## 2016-09-23 NOTE — Assessment & Plan Note (Signed)
Not convinced this is true asthma. Smoking cessation primary intervention at this time We'll obtain spirometry in the future pre-and post to clarify

## 2016-09-25 ENCOUNTER — Telehealth: Payer: Self-pay

## 2016-09-25 ENCOUNTER — Encounter (HOSPITAL_BASED_OUTPATIENT_CLINIC_OR_DEPARTMENT_OTHER): Payer: Self-pay | Admitting: *Deleted

## 2016-09-25 MED ORDER — LACTATED RINGERS IV SOLN: INTRAVENOUS | Status: DC

## 2016-09-25 MED ORDER — FENTANYL CITRATE (PF) 100 MCG/2ML IJ SOLN: 50.0000 ug | INTRAMUSCULAR | Status: DC | PRN

## 2016-09-25 MED ORDER — SCOPOLAMINE 1 MG/3DAYS TD PT72: 1.0000 | MEDICATED_PATCH | Freq: Once | TRANSDERMAL | Status: DC | PRN

## 2016-09-25 MED ORDER — MIDAZOLAM HCL 2 MG/2ML IJ SOLN: 1.0000 mg | INTRAMUSCULAR | Status: DC | PRN

## 2016-09-25 NOTE — Progress Notes (Signed)
Anesthesia Chart Review:  Pt is a same day work up.   Pt is a 46 year old female scheduled for R knee arthroscopy with meniscal repair on 09/28/2016 with Dorna Leitz, MD.   - PCP is Benito Mccreedy, MD  - Pulmonologist is Kara Mead, MD, last office visit 09/23/16. Pt denied SOB or CP at this visit.   - Cardiologist is [name], MD, last office visit 09/18/16.  At this visit, pt c/o CP and SOB. CP ruled musculoskeletal, SOB thought to be asthma/COPD exacerbation and not cardiac (CXR results below, BNP normal). Pt given zithromax and prednisone    PMH includes:  CHF, HTN, heart murmur, asthma, schizophrenia, bipolar disorder. New diagnosis of OSA recently, CPAP has not yet arrived. Current smoker. BMI 43.5. S/p lumbar fusion 12/28/13. S/p lumbar laminectomy 04/01/12 and lumbar wound debridement 04/29/12.   Medications include: albuterol, abilify, zithromax (last dose will be 09/26/16), lasix, lisinopril, potassium, prednisone (last dose 09/26/16).   Labs: BMET 09/18/16 reviewed and acceptable for surgery. CBC will be obtained DOS.   CXR 09/21/16: No active cardiopulmonary disease.  EKG 09/18/16: NSR.   Nuclear stress test 05/26/15:  1. No reversible ischemia or infarction. 2. Mild septal hypokinesis. 3. Left ventricular ejection fraction 57% 4. Low-risk stress test findings  Echo 05/24/15:  - Left ventricle: The cavity size was normal. Wall thickness was increased in a pattern of mild LVH. Systolic function was normal. The estimated ejection fraction was in the range of 60% to 65%. Wall motion was normal; there were no regional wall motion abnormalities. Features are consistent with a pseudonormal left ventricular filling pattern, with concomitant abnormal relaxation and increased filling pressure (grade 2 diastolic dysfunction).  If labs acceptable DOS, I anticipate pt can proceed as scheduled.   Willeen Cass, FNP-BC [facility]/Anesthesiology Phone:  [phone] 09/25/2016 2:46 PM

## 2016-09-25 NOTE — Progress Notes (Addendum)
Lori Bean denies chest pain or shortness of breath, she reports that she feels better than she dids when she was seen by Dr Oval Linsey on 09/18/16.  Lori Siroky was recently tested for sleep apnea. Patient was seen by [name] on 09/24/15 and was informed that she has sleep apnea. CPAP has been ordered but not arrived.   I called Nokki at Dr Berenice Primas office and asked if they received clearance from Dr Oval Linsey, she said yes and it is in epic.  I found a message on 09/12/16 from Dr Oval Linsey that states that patient is a low risk for surgery.  I informed Lexine Baton that patient was seen on 09/18/16 with shortness of breath and edema.

## 2016-09-28 ENCOUNTER — Ambulatory Visit (HOSPITAL_COMMUNITY)
Admission: RE | Admit: 2016-09-28 | Discharge: 2016-09-28 | Disposition: A | Discharge status: Home / Self Care | Payer: Medicaid Other | Source: Ambulatory Visit | Attending: Orthopedic Surgery | Admitting: Orthopedic Surgery

## 2016-09-28 ENCOUNTER — Inpatient Hospital Stay (HOSPITAL_COMMUNITY): Payer: Medicaid Other | Admitting: Emergency Medicine

## 2016-09-28 ENCOUNTER — Encounter (HOSPITAL_COMMUNITY)
Admission: RE | Disposition: A | Discharge status: Home / Self Care | Payer: Self-pay | Source: Ambulatory Visit | Attending: Orthopedic Surgery

## 2016-09-28 ENCOUNTER — Encounter (HOSPITAL_COMMUNITY): Payer: Self-pay | Admitting: Certified Registered Nurse Anesthetist

## 2016-09-28 DIAGNOSIS — F419 Anxiety disorder, unspecified: Secondary | ICD-10-CM | POA: Diagnosis not present

## 2016-09-28 DIAGNOSIS — M6751 Plica syndrome, right knee: Secondary | ICD-10-CM | POA: Diagnosis present

## 2016-09-28 DIAGNOSIS — F209 Schizophrenia, unspecified: Secondary | ICD-10-CM | POA: Diagnosis not present

## 2016-09-28 DIAGNOSIS — F319 Bipolar disorder, unspecified: Secondary | ICD-10-CM | POA: Insufficient documentation

## 2016-09-28 DIAGNOSIS — Z6841 Body Mass Index (BMI) 40.0 and over, adult: Secondary | ICD-10-CM | POA: Diagnosis not present

## 2016-09-28 DIAGNOSIS — K219 Gastro-esophageal reflux disease without esophagitis: Secondary | ICD-10-CM | POA: Diagnosis not present

## 2016-09-28 DIAGNOSIS — F17210 Nicotine dependence, cigarettes, uncomplicated: Secondary | ICD-10-CM | POA: Insufficient documentation

## 2016-09-28 DIAGNOSIS — M1990 Unspecified osteoarthritis, unspecified site: Secondary | ICD-10-CM | POA: Insufficient documentation

## 2016-09-28 DIAGNOSIS — I10 Essential (primary) hypertension: Secondary | ICD-10-CM | POA: Insufficient documentation

## 2016-09-28 DIAGNOSIS — G4733 Obstructive sleep apnea (adult) (pediatric): Secondary | ICD-10-CM | POA: Diagnosis not present

## 2016-09-28 DIAGNOSIS — M23321 Other meniscus derangements, posterior horn of medial meniscus, right knee: Secondary | ICD-10-CM | POA: Diagnosis not present

## 2016-09-28 DIAGNOSIS — F329 Major depressive disorder, single episode, unspecified: Secondary | ICD-10-CM | POA: Insufficient documentation

## 2016-09-28 DIAGNOSIS — M94261 Chondromalacia, right knee: Secondary | ICD-10-CM | POA: Diagnosis present

## 2016-09-28 DIAGNOSIS — I509 Heart failure, unspecified: Secondary | ICD-10-CM | POA: Diagnosis not present

## 2016-09-28 DIAGNOSIS — S83241A Other tear of medial meniscus, current injury, right knee, initial encounter: Secondary | ICD-10-CM

## 2016-09-28 DIAGNOSIS — J45909 Unspecified asthma, uncomplicated: Secondary | ICD-10-CM | POA: Diagnosis not present

## 2016-09-28 DIAGNOSIS — Z79899 Other long term (current) drug therapy: Secondary | ICD-10-CM | POA: Diagnosis not present

## 2016-09-28 DIAGNOSIS — Z7952 Long term (current) use of systemic steroids: Secondary | ICD-10-CM | POA: Insufficient documentation

## 2016-09-28 HISTORY — DX: Polyneuropathy, unspecified: G62.9

## 2016-09-28 HISTORY — PX: KNEE ARTHROSCOPY WITH MENISCAL REPAIR: SHX5653

## 2016-09-28 LAB — CBC
HCT: 38.8 % (ref 36.0–46.0)
Hemoglobin: 13.3 g/dL (ref 12.0–15.0)
MCH: 31.7 pg (ref 26.0–34.0)
MCHC: 34.3 g/dL (ref 30.0–36.0)
MCV: 92.4 fL (ref 78.0–100.0)
Platelets: 284 10*3/uL (ref 150–400)
RBC: 4.2 MIL/uL (ref 3.87–5.11)
RDW: 14.2 % (ref 11.5–15.5)
WBC: 13.3 10*3/uL — ABNORMAL HIGH (ref 4.0–10.5)

## 2016-09-28 SURGERY — ARTHROSCOPY, KNEE, WITH MENISCUS REPAIR
Anesthesia: General | Site: Knee | Laterality: Right

## 2016-09-28 MED ORDER — BUPIVACAINE HCL (PF) 0.5 % IJ SOLN
INTRAMUSCULAR | Status: DC | PRN
  Administered 2016-09-28: 20 mL

## 2016-09-28 MED ORDER — CEFAZOLIN SODIUM-DEXTROSE 2-4 GM/100ML-% IV SOLN
2.0000 g | INTRAVENOUS | Status: AC
  Administered 2016-09-28: 2 g via INTRAVENOUS

## 2016-09-28 MED ORDER — 0.9 % SODIUM CHLORIDE (POUR BTL) OPTIME
TOPICAL | Status: DC | PRN
  Administered 2016-09-28: 1000 mL

## 2016-09-28 MED ORDER — OXYCODONE-ACETAMINOPHEN 5-325 MG PO TABS: 1.0000 | ORAL_TABLET | Freq: Four times a day (QID) | ORAL | 0 refills | Status: DC | PRN

## 2016-09-28 MED ORDER — SODIUM CHLORIDE 0.9 % IR SOLN
Status: DC | PRN
  Administered 2016-09-28: 3000 mL

## 2016-09-28 MED ORDER — LIDOCAINE 2% (20 MG/ML) 5 ML SYRINGE
INTRAMUSCULAR | Status: DC | PRN
  Administered 2016-09-28: 100 mg via INTRAVENOUS

## 2016-09-28 MED ORDER — FENTANYL CITRATE (PF) 100 MCG/2ML IJ SOLN
INTRAMUSCULAR | Status: AC
  Filled 2016-09-28: qty 2

## 2016-09-28 MED ORDER — FENTANYL CITRATE (PF) 100 MCG/2ML IJ SOLN
INTRAMUSCULAR | Status: DC | PRN
  Administered 2016-09-28 (×3): 50 ug via INTRAVENOUS

## 2016-09-28 MED ORDER — HYDROMORPHONE HCL 1 MG/ML IJ SOLN: 0.2500 mg | INTRAMUSCULAR | Status: DC | PRN

## 2016-09-28 MED ORDER — PROPOFOL 10 MG/ML IV BOLUS
INTRAVENOUS | Status: DC | PRN
  Administered 2016-09-28: 40 mg via INTRAVENOUS
  Administered 2016-09-28: 100 mg via INTRAVENOUS

## 2016-09-28 MED ORDER — LACTATED RINGERS IV SOLN
INTRAVENOUS | Status: DC
  Administered 2016-09-28: 50 mL/h via INTRAVENOUS

## 2016-09-28 MED ORDER — ONDANSETRON HCL 4 MG/2ML IJ SOLN
INTRAMUSCULAR | Status: AC
  Filled 2016-09-28: qty 2

## 2016-09-28 MED ORDER — ALBUTEROL SULFATE HFA 108 (90 BASE) MCG/ACT IN AERS
INHALATION_SPRAY | RESPIRATORY_TRACT | Status: DC | PRN
  Administered 2016-09-28: 4 via RESPIRATORY_TRACT

## 2016-09-28 MED ORDER — CHLORHEXIDINE GLUCONATE 4 % EX LIQD: 60.0000 mL | Freq: Once | CUTANEOUS | Status: DC

## 2016-09-28 MED ORDER — METOPROLOL TARTRATE 5 MG/5ML IV SOLN
INTRAVENOUS | Status: AC
  Filled 2016-09-28: qty 5

## 2016-09-28 MED ORDER — MIDAZOLAM HCL 5 MG/5ML IJ SOLN
INTRAMUSCULAR | Status: DC | PRN
  Administered 2016-09-28: 2 mg via INTRAVENOUS

## 2016-09-28 MED ORDER — BUPIVACAINE HCL (PF) 0.5 % IJ SOLN
INTRAMUSCULAR | Status: AC
  Filled 2016-09-28: qty 30

## 2016-09-28 MED ORDER — PROPOFOL 10 MG/ML IV BOLUS
INTRAVENOUS | Status: AC
  Filled 2016-09-28: qty 20

## 2016-09-28 MED ORDER — LACTATED RINGERS IV SOLN
INTRAVENOUS | Status: DC | PRN
  Administered 2016-09-28: 12:00:00 via INTRAVENOUS

## 2016-09-28 MED ORDER — SUGAMMADEX SODIUM 500 MG/5ML IV SOLN
INTRAVENOUS | Status: DC | PRN
  Administered 2016-09-28: 216 mg via INTRAVENOUS

## 2016-09-28 MED ORDER — CEFAZOLIN SODIUM-DEXTROSE 2-4 GM/100ML-% IV SOLN
INTRAVENOUS | Status: AC
  Filled 2016-09-28: qty 100

## 2016-09-28 MED ORDER — PROMETHAZINE HCL 25 MG/ML IJ SOLN: 6.2500 mg | INTRAMUSCULAR | Status: DC | PRN

## 2016-09-28 MED ORDER — ROCURONIUM BROMIDE 10 MG/ML (PF) SYRINGE
PREFILLED_SYRINGE | INTRAVENOUS | Status: DC | PRN
  Administered 2016-09-28: 40 mg via INTRAVENOUS

## 2016-09-28 MED ORDER — ONDANSETRON HCL 4 MG/2ML IJ SOLN
INTRAMUSCULAR | Status: DC | PRN
  Administered 2016-09-28: 4 mg via INTRAVENOUS

## 2016-09-28 MED ORDER — ALBUTEROL SULFATE HFA 108 (90 BASE) MCG/ACT IN AERS
INHALATION_SPRAY | RESPIRATORY_TRACT | Status: AC
  Filled 2016-09-28: qty 6.7

## 2016-09-28 SURGICAL SUPPLY — 31 items
BANDAGE ACE 6X5 VEL STRL LF (GAUZE/BANDAGES/DRESSINGS) ×2 IMPLANT
BLADE CUDA 5.5 (BLADE) ×2 IMPLANT
BLADE GREAT WHITE 4.2 (BLADE) ×2 IMPLANT
DRAPE ARTHROSCOPY W/POUCH 114 (DRAPES) ×2 IMPLANT
DRAPE PROXIMA HALF (DRAPES) ×2 IMPLANT
DRSG EMULSION OIL 3X3 NADH (GAUZE/BANDAGES/DRESSINGS) ×2 IMPLANT
DURAPREP 26ML APPLICATOR (WOUND CARE) ×2 IMPLANT
FILTER STRAW FLUID ASPIR (MISCELLANEOUS) ×2 IMPLANT
GAUZE SPONGE 4X4 12PLY STRL (GAUZE/BANDAGES/DRESSINGS) ×2 IMPLANT
GLOVE BIOGEL PI IND STRL 8 (GLOVE) ×2 IMPLANT
GLOVE BIOGEL PI INDICATOR 8 (GLOVE) ×2
GLOVE ECLIPSE 7.5 STRL STRAW (GLOVE) ×4 IMPLANT
GOWN STRL REUS W/ TWL LRG LVL3 (GOWN DISPOSABLE) ×1 IMPLANT
GOWN STRL REUS W/ TWL XL LVL3 (GOWN DISPOSABLE) ×2 IMPLANT
GOWN STRL REUS W/TWL LRG LVL3 (GOWN DISPOSABLE) ×1
GOWN STRL REUS W/TWL XL LVL3 (GOWN DISPOSABLE) ×2
KIT BASIN OR (CUSTOM PROCEDURE TRAY) ×2 IMPLANT
KIT ROOM TURNOVER OR (KITS) ×2 IMPLANT
NEEDLE 18GX1X1/2 (RX/OR ONLY) (NEEDLE) ×2 IMPLANT
PACK ARTHROSCOPY DSU (CUSTOM PROCEDURE TRAY) ×2 IMPLANT
PAD ABD 8X10 STRL (GAUZE/BANDAGES/DRESSINGS) ×2 IMPLANT
PAD ARMBOARD 7.5X6 YLW CONV (MISCELLANEOUS) ×4 IMPLANT
PAD CAST 4YDX4 CTTN HI CHSV (CAST SUPPLIES) ×1 IMPLANT
PADDING CAST COTTON 4X4 STRL (CAST SUPPLIES) ×1
SET ARTHROSCOPY TUBING (MISCELLANEOUS) ×1
SET ARTHROSCOPY TUBING LN (MISCELLANEOUS) ×1 IMPLANT
SPONGE LAP 18X18 X RAY DECT (DISPOSABLE) ×2 IMPLANT
SUT ETHILON 4 0 PS 2 18 (SUTURE) ×2 IMPLANT
SYR 5ML LL (SYRINGE) ×2 IMPLANT
TOWEL OR 17X24 6PK STRL BLUE (TOWEL DISPOSABLE) ×2 IMPLANT
TOWEL OR 17X26 10 PK STRL BLUE (TOWEL DISPOSABLE) ×2 IMPLANT

## 2016-09-28 NOTE — Anesthesia Procedure Notes (Signed)
Procedure Name: Intubation Date/Time: 09/28/2016 1:27 PM Performed by: Everlean Cherry A Pre-anesthesia Checklist: Patient identified, Suction available, Patient being monitored and Emergency Drugs available Patient Re-evaluated:Patient Re-evaluated prior to inductionOxygen Delivery Method: Circle system utilized Preoxygenation: Pre-oxygenation with 100% oxygen Intubation Type: IV induction Ventilation: Mask ventilation without difficulty and Oral airway inserted - appropriate to patient size Laryngoscope Size: 2 and Miller Grade View: Grade II Tube type: Oral Tube size: 7.0 mm Number of attempts: 1 Airway Equipment and Method: Stylet Placement Confirmation: ETT inserted through vocal cords under direct vision,  positive ETCO2 and breath sounds checked- equal and bilateral Secured at: 23 cm Tube secured with: Tape Dental Injury: Teeth and Oropharynx as per pre-operative assessment

## 2016-09-28 NOTE — H&P (Addendum)
PREOPERATIVE H&P  Chief Complaint: Right knee pain  HPI: Lori Bean is a 46 y.o. female who presents for evaluation of Right knee pain. It has been present for Greater than 3 months and has been worsening. She has failed conservative measures. Pain is rated as moderate.  Past Medical History:  Diagnosis Date  . Anginal pain ([place])    occ from asthma  . Anxiety   . Arthritis   . Asthma   . Bipolar affective disorder ([place])    Schizophrenia  . Bronchitis    hx of  . CHF (congestive heart failure) ([place])   . Depression   . GERD (gastroesophageal reflux disease)   . Heart murmur   . Hypertension   . Neuropathy ([facility])    left leg , "from back surgery"  . Overactive bladder   . Schizophrenia ([place])   . Sciatica   . Shortness of breath   . Snoring disorder    Pt stated my boyfriend always wakes me up and tells me to breathe   Past Surgical History:  Procedure Laterality Date  . BACK SURGERY     3 back surgeries  . CHOLECYSTECTOMY    . COLONOSCOPY WITH PROPOFOL N/A 01/23/2016   Procedure: COLONOSCOPY WITH PROPOFOL;  Surgeon: Wonda Horner, MD;  Location: [facility];  Service: Endoscopy;  Laterality: N/A;  . EYE SURGERY     Metal plate in right eye. Had fracture in right eye  . gallstones reomved    . LUMBAR LAMINECTOMY/DECOMPRESSION MICRODISCECTOMY  04/01/2012   Procedure: LUMBAR LAMINECTOMY/DECOMPRESSION MICRODISCECTOMY 2 LEVELS;  Surgeon: Faythe Ghee, MD;  Location: [facility];  Service: Neurosurgery;  Laterality: Left;  Lumbar four-five, lumbar five sacral one microdiscectomy   . LUMBAR WOUND DEBRIDEMENT  04/29/2012   Procedure: LUMBAR WOUND DEBRIDEMENT;  Surgeon: Faythe Ghee, MD;  Location: [facility];  Service: Neurosurgery;  Laterality: N/A;  lumbar wound debridement  . ROTATOR CUFF REPAIR     Right shoulder  . TUBAL LIGATION     Social History   Social History  . Marital status: Divorced    Spouse name: N/A  . Number of children: 2  . Years of  education: 12   Occupational History  .      disabled   Social History Main Topics  . Smoking status: Current Every Day Smoker    Packs/day: 0.50    Years: 24.00    Types: Cigarettes    Last attempt to quit: 05/23/2014  . Smokeless tobacco: Never Used  . Alcohol use No     Comment: quit Nov. 2014  . Drug use: No  . Sexual activity: Not Currently    Birth control/ protection: Surgical   Other Topics Concern  . None   Social History Narrative   Lives alone   Caffeine - coffee 1 cup daily   Family History  Problem Relation Age of Onset  . Diabetes Mother   . Hypertension Mother   . Diabetes Father   . Heart disease Paternal Aunt   . Cancer Paternal Aunt    Allergies  Allergen Reactions  . Aspirin Nausea And Vomiting   Prior to Admission medications   Medication Sig Start Date End Date Taking? Authorizing Provider  albuterol (PROAIR HFA) 108 (90 Base) MCG/ACT inhaler INHALE TWO PUFFS INTO THE LUNGS EVERY SIX HOURS AS NEEDED FOR WHEEZING OR SHORTNESS OF BREATH Patient taking differently: Inhale 2 puffs into the lungs every 6 (six) hours as needed for  wheezing or shortness of breath.  11/07/15  Yes Lance Bosch, NP  ARIPiprazole (ABILIFY) 20 MG tablet Take 1 tablet (20 mg total) by mouth at bedtime. Patient taking differently: Take 20 mg by mouth 2 (two) times daily.  03/04/15  Yes Lance Bosch, NP  azithromycin (ZITHROMAX) 250 MG tablet 2 TABLETS TODAY AND THEN ONE DAILY 09/18/16  Yes [name], MD  furosemide (LASIX) 80 MG tablet Take 80 mg by mouth 2 (two) times daily.   Yes Historical Provider, MD  gabapentin (NEURONTIN) 300 MG capsule TAKE ONE (1) CAPSULE BY MOUTH 2 TIMES DAILY 01/22/15  Yes Lance Bosch, NP  lisinopril (PRINIVIL,ZESTRIL) 20 MG tablet Take 1 tablet (20 mg total) by mouth daily. 09/18/16 12/17/16 Yes [name], MD  meloxicam (MOBIC) 7.5 MG tablet Take 2 tablets (15 mg total) by mouth daily. 03/09/16  Yes Larene Pickett, PA-C  potassium chloride  20 MEQ TBCR Take 20 mEq by mouth 2 (two) times daily. 05/26/15  Yes Eileen Stanford, PA-C  predniSONE (DELTASONE) 20 MG tablet 2 TABLETS BY MOUTH DAILY 09/18/16  Yes [name], MD  triamcinolone cream (KENALOG) 0.1 % Apply 1 application topically 2 (two) times daily as needed (foot rash). 11/07/15  Yes Lance Bosch, NP     Positive [place]:[id number]  All other systems have been reviewed and were otherwise negative with the exception of those mentioned in the HPI and as above.  Physical Exam: Vitals:   09/28/16 1029  BP: (!) 143/86  Pulse: 73  Resp: 20  Temp: 97.4 F (36.3 C)    General: Alert, no acute distress Cardiovascular: No pedal edema Respiratory: No cyanosis, no use of accessory musculature GI: No organomegaly, abdomen is soft and non-tender Skin: No lesions in the area of chief complaint Neurologic: Sensation intact distally Psychiatric: Patient is competent for consent with normal mood and affect Lymphatic: No axillary or cervical lymphadenopathy  MUSCULOSKELETAL:Right knee: Tender palpation over the medial joint line.  Painful range of motion.  No instability. Trace effusion.   Assessment/Plan: RIGHT KNEE MEDIAL MENISCUS TEAR Plan for Procedure(s): KNEE ARTHROSCOPY WITH MENISCAL REPAIR  The risks benefits and alternatives were discussed with the patient including but not limited to the risks of nonoperative treatment, versus surgical intervention including infection, bleeding, nerve injury, malunion, nonunion, hardware prominence, hardware failure, need for hardware removal, blood clots, cardiopulmonary complications, morbidity, mortality, among others, and they were willing to proceed.  Predicted outcome is good, although there will be at least a six to nine month expected recovery.  Chelsey Redondo L, MD 09/28/2016 12:00 PM

## 2016-09-28 NOTE — Anesthesia Postprocedure Evaluation (Addendum)
Anesthesia Post Note  Patient: Lori Bean  Procedure(s) Performed: Procedure(s) (LRB): KNEE ARTHROSCOPY WITH MENISCAL REPAIR (Right)  Patient location during evaluation: PACU Anesthesia Type: General Level of consciousness: awake Pain management: pain level controlled Vital Signs Assessment: post-procedure vital signs reviewed and stable Respiratory status: spontaneous breathing Cardiovascular status: stable Postop Assessment: no signs of nausea or vomiting Anesthetic complications: no        Last Vitals:  Vitals:   09/28/16 1425 09/28/16 1426  BP:  (!) 153/82  Pulse:  87  Resp:  20  Temp: 36.6 C     Last Pain:  Vitals:   09/28/16 1425  TempSrc:   PainSc: 0-No pain   Pain Goal: Patients Stated Pain Goal: 3 (09/28/16 1218)               [name]

## 2016-09-28 NOTE — Discharge Instructions (Signed)
POST-OP KNEE ARTHROSCOPY INSTRUCTIONS  °Dr. John Graves/Jim Miray Mancino PA-C ° °Pain °You will be expected to have a moderate amount of pain in the affected knee for approximately two weeks. However, the first two days will be the most severe pain. A prescription has been provided to take as needed for the pain. The pain can be reduced by applying ice packs to the knee for the first 1-2 weeks post surgery. Also, keeping the leg elevated on pillows will help alleviate the pain. If you develop any acute pain or swelling in your calf muscle, please call the doctor. ° °Activity °It is preferred that you stay at bed rest for approximately 24 hours. However, you may go to the bathroom with help. Weight bearing as tolerated. You may begin the knee exercises the day of surgery. Discontinue crutches as the knee pain resolves. ° °Dressing °Keep the dressing dry. If the ace bandage should wrinkle or roll up, this can be rewrapped to prevent ridges in the bandage. You may remove all dressings in 48 hours,  apply bandaids to each wound. You may shower on the 4th day after surgery but no tub bath. ° °Symptoms to report to your doctor °Extreme pain °Extreme swelling °Temperature above 101 degrees °Change in the feeling, color, or movement of your toes °Redness, heat, or swelling at your incision ° °Exercise °If is preferred that as soon as possible you try to do a straight leg raise without bending the knee and concentrate on bringing the heel of your foot off the bed up to approximately 45 degrees and hold for the count of 10 seconds. Repeat this at least 10 times three or four times per day. Additional exercises are provided below. ° °You are encouraged to bend the knee as tolerated. ° °Follow-Up °Call to schedule a follow-up appointment in 5-7 days. Our office # is [phone]. ° °POST-OP EXERCISES ° °Short Arc Quads ° °1. Lie on back with legs straight. Place towel roll under thigh, just above knee. °2. Tighten thigh muscles to  straighten knee and lift heel off bed. °3. Hold for slow count of five, then lower. °4. Do three sets of ten ° ° ° °Straight Leg Raises ° °1. Lie on back with operative leg straight and other leg bent. °2. Keeping operative leg completely straight, slowly lift operative leg so foot is 5 inches off bed. °3. Hold for slow count of five, then lower. °4. Do three sets of ten. ° ° ° °DO BOTH EXERCISES 2 TIMES A DAY ° °Ankle Pumps ° °Work/move the operative ankle and foot up and down 10 times every hour while awake. °

## 2016-09-28 NOTE — Anesthesia Preprocedure Evaluation (Signed)
Anesthesia Evaluation  Patient identified by MRN, date of birth, ID band Patient awake    Reviewed: Allergy & Precautions, H&P , NPO status , Patient's Chart, lab work & pertinent test results, reviewed documented beta blocker date and time   Airway Mallampati: II  TM Distance: >3 FB Neck ROM: full    Dental no notable dental hx. (+) Dental Advisory Given   Pulmonary asthma , sleep apnea , Current Smoker, former smoker,    Pulmonary exam normal breath sounds clear to auscultation       Cardiovascular hypertension,  Rhythm:regular Rate:Normal     Neuro/Psych PSYCHIATRIC DISORDERS Anxiety Depression Bipolar Disorder Schizophrenia    GI/Hepatic Neg liver ROS,   Endo/Other  Morbid obesity  Renal/GU negative Renal ROS     Musculoskeletal   Abdominal   Peds  Hematology   Anesthesia Other Findings Asthma    Hypertension      Anxiety    Snoring disorder poss OSA   Heart murmur    CHF    Shortness of breath  Schizophrenia Depression      GERD (gastroesophageal reflux disease)    Arthritis        Anginal pain   Reproductive/Obstetrics                             Anesthesia Physical  Anesthesia Plan  ASA: III  Anesthesia Plan: General   Post-op Pain Management:    Induction: Intravenous  Airway Management Planned: LMA and Oral ETT  Additional Equipment:   Intra-op Plan:   Post-operative Plan: Extubation in OR  Informed Consent: I have reviewed the patients History and Physical, chart, labs and discussed the procedure including the risks, benefits and alternatives for the proposed anesthesia with the patient or authorized representative who has indicated his/her understanding and acceptance.   Dental Advisory Given  Plan Discussed with: CRNA and Surgeon  Anesthesia Plan Comments:         Anesthesia Quick Evaluation

## 2016-09-28 NOTE — Brief Op Note (Signed)
09/28/2016  2:08 PM  PATIENT:  Lori Bean  46 y.o. female  PRE-OPERATIVE DIAGNOSIS:  RIGHT KNEE MEDIAL MENISCUS TEAR  POST-OPERATIVE DIAGNOSIS:  RIGHT KNEE MEDIAL MENISCUS TEAR  PROCEDURE:  Procedure(s) with comments: KNEE ARTHROSCOPY WITH MENISCAL REPAIR (Right) - Right partial meniscectomy and chondroplasty, patellar/femoral joint and medial femoral condyle   SURGEON:  Surgeon(s) and Role:    * Dorna Leitz, MD - Primary  PHYSICIAN ASSISTANT:   ASSISTANTS: bethune   ANESTHESIA:   general  EBL:  Total I/O In: -  Out: 30 [Blood:30]  BLOOD ADMINISTERED:none  DRAINS: none   LOCAL MEDICATIONS USED:  MARCAINE     SPECIMEN:  No Specimen  DISPOSITION OF SPECIMEN:  N/A  COUNTS:  YES  TOURNIQUET:  * No tourniquets in log *  DICTATION: .Other Dictation: Dictation Number [id number]  PLAN OF CARE: Discharge to home after PACU  PATIENT DISPOSITION:  PACU - hemodynamically stable. may need additional observation time or overnight stay based on anesthesia criteria post op   Delay start of Pharmacological VTE agent (>24hrs) due to surgical blood loss or risk of bleeding: no

## 2016-09-28 NOTE — Transfer of Care (Signed)
Immediate Anesthesia Transfer of Care Note  Patient: Lori Bean  Procedure(s) Performed: Procedure(s) with comments: KNEE ARTHROSCOPY WITH MENISCAL REPAIR (Right) - Right partial meniscectomy and chondroplasty, patellar/femoral joint and medial femoral condyle   Patient Location: PACU  Anesthesia Type:General  Level of Consciousness: awake, alert , oriented and patient cooperative  Airway & Oxygen Therapy: Patient Spontanous Breathing and Patient connected to face mask oxygen  Post-op Assessment: Report given to RN, Post -op Vital signs reviewed and stable and Patient moving all extremities  Post vital signs: Reviewed and stable  Last Vitals:  Vitals:   09/28/16 1029  BP: (!) 143/86  Pulse: 73  Resp: 20  Temp: 36.3 C    Last Pain:  Vitals:   09/28/16 1218  TempSrc:   PainSc: 10-Worst pain ever      Patients Stated Pain Goal: 3 ([id number])  Complications: No apparent anesthesia complications

## 2016-09-29 ENCOUNTER — Encounter (HOSPITAL_COMMUNITY): Payer: Self-pay | Admitting: Orthopedic Surgery

## 2016-09-29 NOTE — Op Note (Signed)
Lori Bean, POESCHEL          ACCOUNT NO.:  [id number]  MEDICAL RECORD NO.:  [id number]  LOCATION:                                 FACILITY:  PHYSICIAN:  Alta Corning, M.D.   DATE OF BIRTH:  30-Mar-1971  DATE OF PROCEDURE:  09/28/2016 DATE OF DISCHARGE:                              OPERATIVE REPORT   PREOPERATIVE DIAGNOSIS:  Medial meniscal tear with suspected chondromalacia.  POSTOPERATIVE DIAGNOSES: 1. Medial meniscal tear right. 2. 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 3. Medial shelf plica.  PROCEDURES: 1. Partial medial meniscectomy. 2. Chondroplasty medial femoral condyle and patellofemoral joint down     to bleeding bone where necessary. 3. Medial shelf plica excision.  SURGEON:  Alta Corning, M.D.  ASSISTANT:  Gary Fleet, P.A.  ANESTHESIA:  General.  BRIEF HISTORY:  [name] is a 46 year old female, with a long history of significant complaints of bilateral knee pain.  She was coming in with the idea that she needed knee replacement.  X-rays did not show bone-on-bone change.  She had significant medial joint line tenderness.  She had intermittent catching and grabbing pain and we felt that knee arthroscopy was the more appropriate course of action.  We talked about MRI which she did not wish to proceed with that, and after failure of conservative care, she was taken to the operating room for right knee arthroscopy.  DESCRIPTION OF PROCEDURE:  The patient was taken to the operating room, and after adequate anesthesia was obtained with general anesthetic, the patient was placed supine on the operating table, and the right leg was prepped and draped in usual sterile fashion.  Following this, routine arthroscopic examination of the knee revealed that there was a significant chondromalacia of the patellofemoral joint.  This was debrided back to a smooth and stable rim of articular cartilage in a couple of areas where there  was full thickness and down to bleeding bone where necessary.  Attention then turned to the medial compartment where a large medial plica was draping and this was debrided so that we can gain access into the medial compartment.  There was some hypertrophic synovium along the medial joint line as well and this was debrided. Attention then turned to the medial compartment where the medial meniscus was torn in the posterior aspect.  This was debrided back to a smooth and stable rim.  Attention turned to the medial femoral condyle which had some grade 3 and grade 4 changes which were debrided back to a smooth and stable rim of articular cartilage.  At this point, the knee was copiously and thoroughly irrigated and suctioned dry.  The arthroscopic portals were closed with a bandage after 20 mL of 0.25% Marcaine was instilled in the knee for postoperative anesthesia.  Of note, during the surgical procedure, the ACL was evaluated and felt to be normal, and the lateral side was evaluated and was normal.  At this point, the patient was taken to recovery room where she will be monitored because of a history of sleep apnea, and if doing well will be discharged per Anesthesia, and if not will be spending the night.     Alta Corning, M.D.  ______________________________ Alta Corning, M.D.    Corliss Skains  D:  09/28/2016  T:  09/29/2016  Job:  [id number]

## 2016-10-07 NOTE — Telephone Encounter (Signed)
error 

## 2016-10-09 ENCOUNTER — Encounter: Payer: Self-pay | Admitting: Physician Assistant

## 2016-10-09 ENCOUNTER — Telehealth: Payer: Self-pay | Admitting: *Deleted

## 2016-10-09 ENCOUNTER — Ambulatory Visit (INDEPENDENT_AMBULATORY_CARE_PROVIDER_SITE_OTHER): Payer: Medicaid Other | Admitting: Physician Assistant

## 2016-10-09 VITALS — BP 148/102 | HR 95 | Ht 62.0 in | Wt 240.2 lb

## 2016-10-09 DIAGNOSIS — R079 Chest pain, unspecified: Secondary | ICD-10-CM

## 2016-10-09 DIAGNOSIS — I5032 Chronic diastolic (congestive) heart failure: Secondary | ICD-10-CM

## 2016-10-09 DIAGNOSIS — Z720 Tobacco use: Secondary | ICD-10-CM

## 2016-10-09 DIAGNOSIS — I10 Essential (primary) hypertension: Secondary | ICD-10-CM

## 2016-10-09 DIAGNOSIS — M94261 Chondromalacia, right knee: Secondary | ICD-10-CM | POA: Diagnosis present

## 2016-10-09 DIAGNOSIS — M6751 Plica syndrome, right knee: Secondary | ICD-10-CM | POA: Diagnosis present

## 2016-10-09 MED ORDER — VERAPAMIL HCL ER 120 MG PO TBCR: 120.0000 mg | EXTENDED_RELEASE_TABLET | Freq: Every day | ORAL | 3 refills | Status: DC

## 2016-10-09 MED ORDER — AMLODIPINE BESYLATE 10 MG PO TABS: 10.0000 mg | ORAL_TABLET | Freq: Every day | ORAL | 3 refills | Status: DC

## 2016-10-09 NOTE — Patient Instructions (Addendum)
Medication Instructions:  BEGIN verapamil 120 mg (1 tablet ) one time daily.   Labwork: Have blood work TODAY (BMET).  Testing/Procedures: NONE  Follow-Up: Please follow up in 1 week for a Blood pressure check with a Nurse. Follow up with Dr. Oval Linsey as planned.  Any Other Special Instructions Will Be Listed Below (If Applicable).  Your physician has requested that you regularly monitor and record your blood pressure readings at home. Please use the same machine at the same time of day to check your readings and record them to bring to your follow-up visit.    If you need a refill on your cardiac medications before your next appointment, please call your pharmacy.

## 2016-10-09 NOTE — Discharge Summary (Signed)
Patient ID: Lori Bean MRN: [id number] DOB/AGE: [birth year] 47 y.o.  Admit date: 09/28/2016 Discharge date: 09/28/2016 Admission Diagnoses:  Principal Problem:   Tear of medial meniscus of right knee, initial encounter Active Problems:   Chondromalacia, right knee   Synovial plica of right knee   Discharge Diagnoses:  Same  Past Medical History:  Diagnosis Date  . Anginal pain ([place])    occ from asthma  . Anxiety   . Arthritis   . Asthma   . Bipolar affective disorder ([place])    Schizophrenia  . Bronchitis    hx of  . CHF (congestive heart failure) ([place])   . Depression   . GERD (gastroesophageal reflux disease)   . Heart murmur   . Hypertension   . Neuropathy ([facility])    left leg , "from back surgery"  . Overactive bladder   . Schizophrenia ([place])   . Sciatica   . Shortness of breath   . Snoring disorder    Pt stated my boyfriend always wakes me up and tells me to breathe    Surgeries: Procedure(s):Right knee arthroscopy with partial medial meniscectomy and chondroplasty of medial femoral condyle and patellofemoral joint down to bleeding bone. Medial shelf plica excision right knee.     Discharged Condition: Improved  Hospital Course: Lori Bean is an 46 y.o. female who was admitted 09/28/2016 for operative treatment ofTear of medial meniscus of right knee, initial encounter. Patient has severe unremitting pain that affects sleep, daily activities, and work/hobbies. After pre-op clearance the patient was taken to the operating room on 09/28/2016 and underwent  Procedure(s): Right KNEE ARTHROSCOPY    Patient was given perioperative antibiotics:  Anti-infectives    Start     Dose/Rate Route Frequency Ordered Stop   09/28/16 1150  ceFAZolin (ANCEF) 2-4 GM/100ML-% IVPB    Comments:  Leandrew Koyanagi   : cabinet override      09/28/16 1150 09/28/16 1330   09/28/16 1147  ceFAZolin (ANCEF) IVPB 2g/100 mL premix     2 g 200 mL/hr over 30 Minutes Intravenous  On call to O.R. 09/28/16 1147 09/28/16 1400      The patient was discharged home doing well. This was an outpatient procedure.    Recent vital signs: No data found.    Recent laboratory studies:  Recent Results (from the past 2160 hour(s))  Basic metabolic panel     Status: None   Collection Time: 09/18/16 10:58 AM  Result Value Ref Range   Sodium 138 135 - 146 mmol/Bean   Potassium 4.5 3.5 - 5.3 mmol/Bean   Chloride 101 98 - 110 mmol/Bean   CO2 29 20 - 31 mmol/Bean   Glucose, Bld 90 65 - 99 mg/dL   BUN 11 7 - 25 mg/dL   Creat 0.56 0.50 - 1.10 mg/dL   Calcium 9.1 8.6 - 10.2 mg/dL  B Nat Peptide     Status: None   Collection Time: 09/18/16 10:58 AM  Result Value Ref Range   Brain Natriuretic Peptide 15.2 <100 pg/mL    Comment:   BNP levels increase with age in the general population with the highest values seen in individuals greater than 29 years of age. Reference: Lori Bean Cardiol 2002; [id number].     CBC     Status: Abnormal   Collection Time: 09/28/16 12:18 PM  Result Value Ref Range   WBC 13.3 (H) 4.0 - 10.5 K/uL   RBC 4.20 3.87 - 5.11 MIL/uL   Hemoglobin  13.3 12.0 - 15.0 g/dL   HCT 38.8 36.0 - 46.0 %   MCV 92.4 78.0 - 100.0 fL   MCH 31.7 26.0 - 34.0 pg   MCHC 34.3 30.0 - 36.0 g/dL   RDW 14.2 11.5 - 15.5 %   Platelets 284 150 - 400 K/uL    Discharge Medications:   Allergies as of 09/28/2016      Reactions   Aspirin Nausea And Vomiting      Medication List    STOP taking these medications   azithromycin 250 MG tablet Commonly known as:  ZITHROMAX   predniSONE 20 MG tablet Commonly known as:  DELTASONE     TAKE these medications   albuterol 108 (90 Base) MCG/ACT inhaler Commonly known as:  PROAIR HFA INHALE TWO PUFFS INTO THE LUNGS EVERY SIX HOURS AS NEEDED FOR WHEEZING OR SHORTNESS OF BREATH What changed:  how much to take  how to take this  when to take this  reasons to take this  additional instructions   ARIPiprazole 20 MG tablet Commonly known  as:  ABILIFY Take 1 tablet (20 mg total) by mouth at bedtime. What changed:  when to take this   furosemide 80 MG tablet Commonly known as:  LASIX Take 80 mg by mouth 2 (two) times daily.   gabapentin 300 MG capsule Commonly known as:  NEURONTIN TAKE ONE (1) CAPSULE BY MOUTH 2 TIMES DAILY   lisinopril 20 MG tablet Commonly known as:  PRINIVIL,ZESTRIL Take 1 tablet (20 mg total) by mouth daily.   meloxicam 7.5 MG tablet Commonly known as:  MOBIC Take 2 tablets (15 mg total) by mouth daily.   Potassium Chloride ER 20 MEQ Tbcr Take 20 mEq by mouth 2 (two) times daily.   pregabalin 150 MG capsule Commonly known as:  LYRICA Take 150 mg by mouth daily.   triamcinolone cream 0.1 % Commonly known as:  KENALOG Apply 1 application topically 2 (two) times daily as needed (foot rash).            Durable Medical Equipment        Start     Ordered   09/28/16 0000  DME Crutches     09/28/16 1423      Diagnostic Studies: Dg Chest 2 View  Result Date: 09/21/2016 CLINICAL DATA:  Shortness of breath. EXAM: CHEST  2 VIEW COMPARISON:  Radiographs of April 16, 2016. FINDINGS: The heart size and mediastinal contours are within normal limits. Both lungs are clear. No pneumothorax or pleural effusion is noted. The visualized skeletal structures are unremarkable. IMPRESSION: No active cardiopulmonary disease. Electronically Signed   By: Marijo Conception, M.D.   On: 09/21/2016 08:32    Disposition: 01-Home or Self Care  Discharge Instructions    DME Crutches    Complete by:  As directed    Discharge    Complete by:  As directed    To home   Weight bearing as tolerated    Complete by:  As directed    Laterality:  right   Extremity:  Lower      Follow-up Information    Lori L, MD. Schedule an appointment as soon as possible for a visit in 1 week.   Specialty:  Orthopedic Surgery Contact information: [place] [address] [phone]             Signed: Erlene Senters 10/09/2016, 1:16 PM

## 2016-10-09 NOTE — Telephone Encounter (Signed)
Called patient per Lori Marker PA request to verify medications-per OV note with Dr. Oval Linsey on 1/5 patient was to continue amlodipine.   OV today with Lori Ferries PA, amlodipine not on medication list.    Spoke with patient-patient unsure what dose of amlodipine she is taking, reports she will call back to let me know.

## 2016-10-09 NOTE — Progress Notes (Signed)
Cardiology Office Note   Date:  10/09/2016   ID:  Lori Bean, DOB 08-15-1971, MRN [id number]  PCP:  Benito Mccreedy, MD  Cardiologist: Dr Oval Linsey 09/18/2016  Rosaria Ferries, PA-C   Chief Complaint  Patient presents with  . Follow-up    SOB    History of Present Illness: Lori Bean is a 46 y.o. female with a history of OSA w/ CPAP pending, D-CHF, HTN, bipolar and schizophrenia, DVT 01/20/2014 L popliteal, tob use, morbid obesity  01/05, Dr Oval Linsey saw and added lisinopril for BP. SOB felt 2nd asthma/COPD exacerbation, ABX and short course steroids given. BNP was 15, BMET OK, CXR NAD. 01/15, R medial meniscus surgery  Lori Bean presents for cardiology evaluation.  Her BP has been running high. Yesterday it was 202/147, then 177/101. These readings were yesterday evening, pt has not missed any doses of her meds.She generally takes her evening Lasix about 6:00.  Her breathing has been ok. She has not been wheezing and not has not been coughing a lot. She has had no fevers or chills. The antibiotics and steroids helped her.   She has not done much walking since her knee surgery. She admits to eating poorly. LE edema is mild, the post-op leg has a little more. No CP, she is to get her CPAP on 01/31. She has some orthopnea and PND, but it could be related to the OSA. No recent change.   Past Medical History:  Diagnosis Date  . Anginal pain ([place])    occ from asthma  . Anxiety   . Arthritis   . Asthma   . Bipolar affective disorder ([place])    Schizophrenia  . Bronchitis    hx of  . CHF (congestive heart failure) ([place])   . Depression   . GERD (gastroesophageal reflux disease)   . Heart murmur   . Hypertension   . Neuropathy ([facility])    left leg , "from back surgery"  . Overactive bladder   . Schizophrenia ([place])   . Sciatica   . Shortness of breath   . Snoring disorder    Pt stated my boyfriend always wakes me up and tells me to breathe     Past Surgical History:  Procedure Laterality Date  . BACK SURGERY     3 back surgeries  . CHOLECYSTECTOMY    . COLONOSCOPY WITH PROPOFOL N/A 01/23/2016   Procedure: COLONOSCOPY WITH PROPOFOL;  Surgeon: Wonda Horner, MD;  Location: [facility];  Service: Endoscopy;  Laterality: N/A;  . EYE SURGERY     Metal plate in right eye. Had fracture in right eye  . gallstones reomved    . KNEE ARTHROSCOPY WITH MENISCAL REPAIR Right 09/28/2016   Procedure: KNEE ARTHROSCOPY WITH MENISCAL REPAIR;  Surgeon: Dorna Leitz, MD;  Location: [facility];  Service: Orthopedics;  Laterality: Right;  Right partial meniscectomy and chondroplasty, patellar/femoral joint and medial femoral condyle   . LUMBAR LAMINECTOMY/DECOMPRESSION MICRODISCECTOMY  04/01/2012   Procedure: LUMBAR LAMINECTOMY/DECOMPRESSION MICRODISCECTOMY 2 LEVELS;  Surgeon: Faythe Ghee, MD;  Location: [facility];  Service: Neurosurgery;  Laterality: Left;  Lumbar four-five, lumbar five sacral one microdiscectomy   . LUMBAR WOUND DEBRIDEMENT  04/29/2012   Procedure: LUMBAR WOUND DEBRIDEMENT;  Surgeon: Faythe Ghee, MD;  Location: [facility];  Service: Neurosurgery;  Laterality: N/A;  lumbar wound debridement  . ROTATOR CUFF REPAIR     Right shoulder  . TUBAL LIGATION      Medication Sig  .  albuterol (PROAIR HFA) 108 (90 Base) MCG/ACT inhaler INHALE TWO PUFFS INTO THE LUNGS EVERY SIX HOURS AS NEEDED FOR WHEEZING OR SHORTNESS OF BREATH (Patient taking differently: Inhale 2 puffs into the lungs every 6 (six) hours as needed for wheezing or shortness of breath. )  . ARIPiprazole (ABILIFY) 20 MG tablet Take 1 tablet (20 mg total) by mouth at bedtime. (Patient taking differently: Take 20 mg by mouth 2 (two) times daily. )  . furosemide (LASIX) 80 MG tablet Take 80 mg by mouth 2 (two) times daily.  [name] gabapentin (NEURONTIN) 300 MG capsule TAKE ONE (1) CAPSULE BY MOUTH 2 TIMES DAILY  . lisinopril (PRINIVIL,ZESTRIL) 20 MG tablet Take 1 tablet (20 mg  total) by mouth daily.  . meloxicam (MOBIC) 7.5 MG tablet Take 2 tablets (15 mg total) by mouth daily.  . potassium chloride 20 MEQ TBCR Take 20 mEq by mouth 2 (two) times daily.  . pregabalin (LYRICA) 150 MG capsule Take 150 mg by mouth daily.  [name] triamcinolone cream (KENALOG) 0.1 % Apply 1 application topically 2 (two) times daily as needed (foot rash).   Current Facility-Administered Medications  Medication Dose Route Frequency Provider Last Rate Last Dose  . nitroGLYCERIN (NITROSTAT) SL tablet 0.4 mg  0.4 mg Sublingual Q5 min PRN [name], MD   0.4 mg at 05/24/15 0850    Allergies:   Aspirin    Social History:  The patient  reports that she has been smoking Cigarettes.  She has a 12.00 pack-year smoking history. She has never used smokeless tobacco. She reports that she does not drink alcohol or use drugs.   Family History:  The patient's family history includes Cancer in her paternal aunt; Diabetes in her father and mother; Heart disease in her paternal aunt; Hypertension in her mother.    ROS:  Please see the history of present illness. All other systems are reviewed and negative.    PHYSICAL EXAM: VS:  BP (!) 148/102   Pulse 95   Ht 5\' 2"  (1.575 m)   Wt 240 lb 3.2 oz (109 kg)   BMI 43.93 kg/m  , BMI Body mass index is 43.93 kg/m. GEN: Well nourished, well developed, female in no acute distress  HEENT: normal for age  Neck: no JVD, no carotid bruit, no masses Cardiac: RRR; no murmur, no rubs, or gallops Respiratory: Decreased breath sounds bases with a few scattered rales bilaterally, normal work of breathing GI: soft, nontender, nondistended, + BS MS: no deformity or atrophy; no edema; distal pulses are 2+ in all 4 extremities   Skin: warm and dry, no rash Neuro:  Strength and sensation are intact Psych: euthymic mood, full affect   EKG:  EKG is not ordered today.  ECHO: 05/24/2015 - Left ventricle: The cavity size was normal. Wall thickness was   increased  in a pattern of mild LVH. Systolic function was normal.   The estimated ejection fraction was in the range of 60% to 65%.   Wall motion was normal; there were no regional wall motion   abnormalities. Features are consistent with a pseudonormal left   ventricular filling pattern, with concomitant abnormal relaxation   and increased filling pressure (grade 2 diastolic dysfunction).  Myoview: 05/26/2015 IMPRESSION: 1. No reversible ischemia or infarction. 2. Mild septal hypokinesis. 3. Left ventricular ejection fraction 57% 4. Low-risk stress test findings*.   Recent Labs: 06/24/2016: ALT 21 09/18/2016: Brain Natriuretic Peptide 15.2; BUN 11; Creat 0.56; Potassium 4.5; Sodium 138 09/28/2016: Hemoglobin 13.3;  Platelets 284    Lipid Panel    Component Value Date/Time   CHOL 133 05/25/2015 0333   TRIG 105 05/25/2015 0333   HDL 50 05/25/2015 0333   CHOLHDL 2.7 05/25/2015 0333   VLDL 21 05/25/2015 0333   LDLCALC 62 05/25/2015 0333     Wt Readings from Last 3 Encounters:  10/09/16 240 lb 3.2 oz (109 kg)  09/28/16 238 lb (108 kg)  09/23/16 238 lb 6.4 oz (108.1 kg)     Other studies Reviewed: Additional studies/ records that were reviewed today include: Office notes, hospital records and testing.  ASSESSMENT AND PLAN:  1.  Chronic diastolic CHF: Her weight is up a couple of pounds, but her volume status is good by exam. I feel she has just gained some weight. We discussed improving her eating habits. She and her fianc are interested in this. She is encouraged to continue twice a day Lasix and daily weights. We will check a BMET today.  2. Hypertension: Her blood pressure is poorly controlled. She states she is compliant with her medications. I will add verapamil 120 mg daily to her medication regimen. I will check a BMET today. If the verapamil does not control her blood pressure, lisinopril may be able to be increased. Her potassium was 4.6 at her last BMET, so she may need to  come off the potassium since she is on lisinopril.  She can come back in a week for an RN visit with a weight and blood pressure check. At that time, we will know if we need to increase the verapamil or the lisinopril or both.  3. Chest pain: Her symptoms have resolved. Echo and Myoview in 2016 were normal. No further evaluation is indicated.  4. Tob abuse: she is trying to cut back.   Current medicines are reviewed at length with the patient today.  The patient does not have concerns regarding medicines.  The following changes have been made:  Add verapamil  Labs/ tests ordered today include:  No orders of the defined types were placed in this encounter.    Disposition:   FU with Dr Oval Linsey  Signed, Lenoard Aden  10/09/2016 7:57 AM    [place] Phone: [phone]; Fax: [phone]  This note was written with the assistance of speech recognition software. Please excuse any transcriptional errors.  Addendum: Amlodipine 10 mg qd was not on pt med list, but was on the list at her appt with Dr Oval Linsey. Contacted pt who said she was taking it every day (rx for 30 days with no refills given in July 2017). Asked pt to continue taking rx. Hold off on verapamil for now. At RN visit for BP/weight check, if BP not improved, stop amlodipine and start verapamil. Once BMET from today reviewed, may be able to increase lisinopril prior to RV visit. Will also ask pt to make sure she takes am meds 2 hr before RN appt.  Rosaria Ferries, PA-C 10/09/2016 10:01 AM [facility] [phone]

## 2016-10-09 NOTE — Addendum Note (Signed)
Addended by: Patria Mane A on: 10/09/2016 10:14 AM   Modules accepted: Orders

## 2016-10-09 NOTE — Telephone Encounter (Signed)
Patient returned call-states she is taking amlodipine 10mg  once daily.    Per Suanne Marker PA-discontinue/disregard verapamil (do not get filled from today's OV) and continue amlodipine 10mg  one time daily.    Patient has BP check with Nurse next Friday 2/2 at 8:30 AM-per Suanne Marker PA take medications atleast 2 hours prior to appt.    Pt made aware and verbalized understanding.  Pharmacy notified to disregard verapamil rx.  Med list updated.

## 2016-10-10 LAB — BASIC METABOLIC PANEL
BUN: 13 mg/dL (ref 7–25)
CO2: 25 mmol/L (ref 20–31)
Calcium: 9.5 mg/dL (ref 8.6–10.2)
Chloride: 103 mmol/L (ref 98–110)
Creat: 0.62 mg/dL (ref 0.50–1.10)
Glucose, Bld: 98 mg/dL (ref 65–99)
Potassium: 4.5 mmol/L (ref 3.5–5.3)
Sodium: 138 mmol/L (ref 135–146)

## 2016-10-16 ENCOUNTER — Ambulatory Visit (INDEPENDENT_AMBULATORY_CARE_PROVIDER_SITE_OTHER): Payer: Medicaid Other | Admitting: *Deleted

## 2016-10-16 VITALS — BP 120/70 | HR 64 | Ht 62.0 in

## 2016-10-16 DIAGNOSIS — I10 Essential (primary) hypertension: Secondary | ICD-10-CM

## 2016-10-16 NOTE — Patient Instructions (Signed)
Continue current medications   Your physician recommends that you schedule a follow-up appointment December 21 2016 at 8:40 am with Dr Oval Linsey.

## 2016-10-16 NOTE — Progress Notes (Signed)
1.) Reason for visit:  BLOOD PRESSURE CHECK  2.) Name of MD requesting visit:  Rhonda Barrett PA  3.) H&P:  4.) ROS related to problem- Patient states she took morning medication at 6:30 am today as instructed blood pressure 120/70 right  And left arm  Used  lg cuff, pulse 64  5.) Assessment and plan per MD: information reviewed with Rosaria Ferries PA.  Per verbal order, continue with current medication, keep appointment in 3 months. Patient voice understanding.

## 2016-10-21 ENCOUNTER — Telehealth: Payer: Self-pay | Admitting: *Deleted

## 2016-10-21 NOTE — Telephone Encounter (Signed)
Clarified med list per provider request.

## 2016-10-22 ENCOUNTER — Encounter (HOSPITAL_COMMUNITY): Payer: Self-pay

## 2016-10-22 ENCOUNTER — Emergency Department (HOSPITAL_COMMUNITY)
Admission: EM | Admit: 2016-10-22 | Discharge: 2016-10-23 | Disposition: A | Discharge status: Home / Self Care | Payer: Medicaid Other | Attending: Emergency Medicine | Admitting: Emergency Medicine

## 2016-10-22 ENCOUNTER — Emergency Department (HOSPITAL_COMMUNITY): Payer: Medicaid Other

## 2016-10-22 DIAGNOSIS — J45909 Unspecified asthma, uncomplicated: Secondary | ICD-10-CM | POA: Diagnosis not present

## 2016-10-22 DIAGNOSIS — J441 Chronic obstructive pulmonary disease with (acute) exacerbation: Secondary | ICD-10-CM | POA: Diagnosis not present

## 2016-10-22 DIAGNOSIS — I110 Hypertensive heart disease with heart failure: Secondary | ICD-10-CM | POA: Diagnosis not present

## 2016-10-22 DIAGNOSIS — R0602 Shortness of breath: Secondary | ICD-10-CM | POA: Diagnosis present

## 2016-10-22 DIAGNOSIS — I5032 Chronic diastolic (congestive) heart failure: Secondary | ICD-10-CM | POA: Insufficient documentation

## 2016-10-22 DIAGNOSIS — F17210 Nicotine dependence, cigarettes, uncomplicated: Secondary | ICD-10-CM | POA: Diagnosis not present

## 2016-10-22 DIAGNOSIS — Z79899 Other long term (current) drug therapy: Secondary | ICD-10-CM | POA: Insufficient documentation

## 2016-10-22 LAB — BASIC METABOLIC PANEL
Anion gap: 12 (ref 5–15)
BUN: 8 mg/dL (ref 6–20)
CO2: 21 mmol/L — ABNORMAL LOW (ref 22–32)
Calcium: 9.5 mg/dL (ref 8.9–10.3)
Chloride: 106 mmol/L (ref 101–111)
Creatinine, Ser: 0.53 mg/dL (ref 0.44–1.00)
GFR calc Af Amer: >60 mL/min (ref >60)
GFR calc non Af Amer: >60 mL/min (ref >60)
Glucose, Bld: 103 mg/dL — ABNORMAL HIGH (ref 65–99)
Potassium: 3.9 mmol/L (ref 3.5–5.1)
Sodium: 139 mmol/L (ref 135–145)

## 2016-10-22 LAB — CBC
HCT: 38.7 % (ref 36.0–46.0)
Hemoglobin: 13 g/dL (ref 12.0–15.0)
MCH: 31.1 pg (ref 26.0–34.0)
MCHC: 33.6 g/dL (ref 30.0–36.0)
MCV: 92.6 fL (ref 78.0–100.0)
Platelets: 257 10*3/uL (ref 150–400)
RBC: 4.18 MIL/uL (ref 3.87–5.11)
RDW: 13.5 % (ref 11.5–15.5)
WBC: 10.9 10*3/uL — ABNORMAL HIGH (ref 4.0–10.5)

## 2016-10-22 LAB — I-STAT TROPONIN, ED
Troponin i, poc: 0 ng/mL (ref 0.00–0.08)
Troponin i, poc: 0 ng/mL (ref 0.00–0.08)

## 2016-10-22 LAB — BRAIN NATRIURETIC PEPTIDE: B Natriuretic Peptide: 13.2 pg/mL (ref 0.0–100.0)

## 2016-10-22 MED ORDER — METHYLPREDNISOLONE SODIUM SUCC 125 MG IJ SOLR
125.0000 mg | Freq: Once | INTRAMUSCULAR | Status: AC
  Administered 2016-10-22: 125 mg via INTRAVENOUS
  Filled 2016-10-22: qty 2

## 2016-10-22 MED ORDER — ALBUTEROL SULFATE (2.5 MG/3ML) 0.083% IN NEBU
5.0000 mg | INHALATION_SOLUTION | Freq: Once | RESPIRATORY_TRACT | Status: AC
  Administered 2016-10-22: 5 mg via RESPIRATORY_TRACT
  Filled 2016-10-22: qty 6

## 2016-10-22 MED ORDER — IPRATROPIUM BROMIDE 0.02 % IN SOLN
0.5000 mg | Freq: Once | RESPIRATORY_TRACT | Status: AC
  Administered 2016-10-23: 0.5 mg via RESPIRATORY_TRACT
  Filled 2016-10-22: qty 2.5

## 2016-10-22 MED ORDER — ALBUTEROL (5 MG/ML) CONTINUOUS INHALATION SOLN
10.0000 mg/h | INHALATION_SOLUTION | Freq: Once | RESPIRATORY_TRACT | Status: AC
  Administered 2016-10-23: 10 mg/h via RESPIRATORY_TRACT
  Filled 2016-10-22: qty 20

## 2016-10-22 MED ORDER — IPRATROPIUM BROMIDE 0.02 % IN SOLN
0.5000 mg | Freq: Once | RESPIRATORY_TRACT | Status: AC
  Administered 2016-10-22: 0.5 mg via RESPIRATORY_TRACT
  Filled 2016-10-22: qty 2.5

## 2016-10-22 NOTE — ED Triage Notes (Signed)
Pt complaining of SOB x several days. Pt states increased SOB today. Pt states recently started on CPAP, no improvement. Pt states hx of CHF. Pt states "I feel like I'm drowning." Pt states increased SOB with lying flat.

## 2016-10-22 NOTE — ED Notes (Signed)
ED Provider at bedside. 

## 2016-10-22 NOTE — ED Notes (Signed)
Pt visibly SOB, speaking in broken sentences; rhonchi and coarse crackles throughout

## 2016-10-22 NOTE — ED Provider Notes (Signed)
[facility] DEPT Provider Note   CSN: [id number] Arrival date & time: 10/22/16  1740     History   Chief Complaint Chief Complaint  Patient presents with  . Shortness of Breath  . Congestive Heart Failure    HPI Lori Bean is a 46 y.o. female.  HPI   Pt with hx HTn, CHF, COPD/asthma p/w SOB at rest, worse with exertion and lying flat, wheezing, dry cough, sharp chest pain, soreness in the abdomen from coughing, leg swelling.  Has had sore throat 1-2 weeks.  Is also having urinary urgency.  Denies fevers.   Antihypertension medication changed two weeks ago.  No other medication changes.  Was on prednisone until two weeks ago, has had several rounds of steroids this winter.  Using nebs, 80mg  daily lasix without improvement.  Did not get flu vx this year.    Past Medical History:  Diagnosis Date  . Anginal pain ([place])    occ from asthma  . Anxiety   . Arthritis   . Asthma   . Bipolar affective disorder ([place])    Schizophrenia  . Bronchitis    hx of  . CHF (congestive heart failure) ([place])   . Depression   . GERD (gastroesophageal reflux disease)   . Heart murmur   . Hypertension   . Neuropathy ([facility])    left leg , "from back surgery"  . Overactive bladder   . Schizophrenia ([place])   . Sciatica   . Shortness of breath   . Snoring disorder    Pt stated my boyfriend always wakes me up and tells me to breathe    Patient Active Problem List   Diagnosis Date Noted  . 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 Synovial plica of right knee [id number]  . Tear of medial meniscus of right knee, initial encounter 09/28/2016  . OSA (obstructive sleep apnea) 09/23/2016  . Chronic diastolic CHF (congestive heart failure) ([place]) 04/22/2016  . Prediabetes 04/22/2016  . Tobacco abuse disorder 04/03/2014  . Obesity (BMI 30-39.9) 04/03/2014  . Chest pain 04/03/2014  . Mild cardiomegaly 02/21/2014  . Lumbar spondylosis 12/28/2013  . Extrinsic asthma 06/07/2013  . Anxiety     . Hypertension   . Bipolar affective disorder ([place])   . GERD (gastroesophageal reflux disease)   . Arthritis   . Schizophrenia ([place])   . Overactive bladder     Past Surgical History:  Procedure Laterality Date  . BACK SURGERY     3 back surgeries  . CHOLECYSTECTOMY    . COLONOSCOPY WITH PROPOFOL N/A 01/23/2016   Procedure: COLONOSCOPY WITH PROPOFOL;  Surgeon: Wonda Horner, MD;  Location: [facility];  Service: Endoscopy;  Laterality: N/A;  . EYE SURGERY     Metal plate in right eye. Had fracture in right eye  . gallstones reomved    . KNEE ARTHROSCOPY WITH MENISCAL REPAIR Right 09/28/2016   Procedure: KNEE ARTHROSCOPY WITH MENISCAL REPAIR;  Surgeon: Dorna Leitz, MD;  Location: [facility];  Service: Orthopedics;  Laterality: Right;  Right partial meniscectomy and chondroplasty, patellar/femoral joint and medial femoral condyle   . LUMBAR LAMINECTOMY/DECOMPRESSION MICRODISCECTOMY  04/01/2012   Procedure: LUMBAR LAMINECTOMY/DECOMPRESSION MICRODISCECTOMY 2 LEVELS;  Surgeon: Faythe Ghee, MD;  Location: [facility];  Service: Neurosurgery;  Laterality: Left;  Lumbar four-five, lumbar five sacral one microdiscectomy   . LUMBAR WOUND DEBRIDEMENT  04/29/2012   Procedure: LUMBAR WOUND DEBRIDEMENT;  Surgeon: Faythe Ghee, MD;  Location: [facility];  Service: Neurosurgery;  Laterality: N/A;  lumbar wound debridement  . ROTATOR CUFF REPAIR     Right shoulder  . TUBAL LIGATION      OB History    No data available       Home Medications    Prior to Admission medications   Medication Sig Start Date End Date Taking? Authorizing Provider  albuterol (PROAIR HFA) 108 (90 Base) MCG/ACT inhaler INHALE TWO PUFFS INTO THE LUNGS EVERY SIX HOURS AS NEEDED FOR WHEEZING OR SHORTNESS OF BREATH Patient taking differently: Inhale 2 puffs into the lungs every 6 (six) hours as needed for wheezing or shortness of breath.  11/07/15  Yes Lance Bosch, NP  amLODipine (NORVASC) 10 MG tablet Take 1 tablet  (10 mg total) by mouth daily. 10/09/16 01/07/17 Yes Rhonda G Barrett, PA-C  ARIPiprazole (ABILIFY) 20 MG tablet Take 1 tablet (20 mg total) by mouth at bedtime. Patient taking differently: Take 20 mg by mouth 2 (two) times daily.  03/04/15  Yes Lance Bosch, NP  furosemide (LASIX) 80 MG tablet Take 80 mg by mouth 2 (two) times daily.   Yes Historical Provider, MD  gabapentin (NEURONTIN) 300 MG capsule TAKE ONE (1) CAPSULE BY MOUTH 2 TIMES DAILY 01/22/15  Yes Lance Bosch, NP  lisinopril (PRINIVIL,ZESTRIL) 20 MG tablet Take 1 tablet (20 mg total) by mouth daily. 09/18/16 12/17/16 Yes [name], MD  pregabalin (LYRICA) 150 MG capsule Take 150 mg by mouth daily.   Yes Historical Provider, MD  triamcinolone cream (KENALOG) 0.1 % Apply 1 application topically 2 (two) times daily as needed (foot rash). 11/07/15  Yes Lance Bosch, NP  HYDROcodone-homatropine (HYCODAN) 5-1.5 MG/5ML syrup Take 5 mLs by mouth every 6 (six) hours as needed for cough. 10/23/16   Clayton Bibles, PA-C  meloxicam (MOBIC) 7.5 MG tablet Take 2 tablets (15 mg total) by mouth daily. Patient not taking: Reported on 10/21/2016 03/09/16   Larene Pickett, PA-C  potassium chloride 20 MEQ TBCR Take 20 mEq by mouth 2 (two) times daily. Patient not taking: Reported on 10/22/2016 05/26/15   Eileen Stanford, PA-C  predniSONE (DELTASONE) 50 MG tablet Take 1 tablet (50 mg total) by mouth daily. 10/23/16 10/28/16  Clayton Bibles, PA-C    Family History Family History  Problem Relation Age of Onset  . Diabetes Mother   . Hypertension Mother   . Diabetes Father   . Heart disease Paternal Aunt   . Cancer Paternal Aunt     Social History Social History  Substance Use Topics  . Smoking status: Current Every Day Smoker    Packs/day: 0.50    Years: 24.00    Types: Cigarettes    Last attempt to quit: 05/23/2014  . Smokeless tobacco: Never Used  . Alcohol use No     Comment: quit Nov. 2014     Allergies   Aspirin   Review of Systems Review of  Systems  All other systems reviewed and are negative.    Physical Exam Updated Vital Signs BP 147/73   Pulse 109   Temp 98.2 F (36.8 C) (Oral)   Resp (!) 28   SpO2 94%   Physical Exam  Constitutional: She appears well-developed and well-nourished. No distress.  HENT:  Head: Normocephalic and atraumatic.  Neck: Neck supple.  Cardiovascular: Normal rate and regular rhythm.   Pulmonary/Chest: Effort normal. Tachypnea noted. She has decreased breath sounds. She has wheezes. She has no rales.  Abdominal: Soft. She exhibits no distension. There is  no tenderness. There is no rebound and no guarding.  Neurological: She is alert.  Skin: She is not diaphoretic.  Nursing note and vitals reviewed.    ED Treatments / Results  Labs (all labs ordered are listed, but only abnormal results are displayed) Labs Reviewed  BASIC METABOLIC PANEL - Abnormal; Notable for the following:       Result Value   CO2 21 (*)    Glucose, Bld 103 (*)    All other components within normal limits  CBC - Abnormal; Notable for the following:    WBC 10.9 (*)    All other components within normal limits  URINALYSIS, ROUTINE W REFLEX MICROSCOPIC - Abnormal; Notable for the following:    APPearance HAZY (*)    All other components within normal limits  URINE CULTURE  BRAIN NATRIURETIC PEPTIDE  INFLUENZA PANEL BY PCR (TYPE A & B)  I-STAT TROPOININ, ED  I-STAT TROPOININ, ED    EKG  EKG Interpretation  Date/Time:  Thursday October 22 2016 17:46:54 EST Ventricular Rate:  97 PR Interval:  168 QRS Duration: 82 QT Interval:  354 QTC Calculation: 449 R Axis:   45 Text Interpretation:  Normal sinus rhythm Nonspecific T wave abnormality Abnormal ECG No significant change since last tracing Confirmed by FLOYD MD, DANIEL [phone]) on 10/22/2016 10:04:59 PM       Radiology Dg Chest 2 View  Result Date: 10/22/2016 CLINICAL DATA:  Patient with shortness of breath for multiple days. Mid sternal chest pain.  EXAM: CHEST  2 VIEW COMPARISON:  Chest radiograph 09/21/2016. FINDINGS: Normal cardiac and mediastinal contours. No consolidative pulmonary opacities. No pleural effusion or pneumothorax. Thoracic spine degenerative changes. IMPRESSION: No active cardiopulmonary disease. Electronically Signed   By: Lovey Newcomer M.D.   On: 10/22/2016 19:01    Procedures Procedures (including critical care time)  Medications Ordered in ED Medications  albuterol (PROVENTIL) (2.5 MG/3ML) 0.083% nebulizer solution 5 mg (5 mg Nebulization Given 10/22/16 2238)  ipratropium (ATROVENT) nebulizer solution 0.5 mg (0.5 mg Nebulization Given 10/22/16 2238)  methylPREDNISolone sodium succinate (SOLU-MEDROL) 125 mg/2 mL injection 125 mg (125 mg Intravenous Given 10/22/16 2238)  albuterol (PROVENTIL,VENTOLIN) solution continuous neb (10 mg/hr Nebulization Given 10/23/16 0015)  ipratropium (ATROVENT) nebulizer solution 0.5 mg (0.5 mg Nebulization Given 10/23/16 0015)     Initial Impression / Assessment and Plan / ED Course  I have reviewed the triage vital signs and the nursing notes.  Pertinent labs & imaging results that were available during my care of the patient were reviewed by me and considered in my medical decision making (see chart for details).  Clinical Course as of Oct 23 548  Thu Oct 22, 2016  2340 Slight improvement in wheezing.  Pt feels no significant improvement after nebs.  O2 90-93% on room air while at rest.    [EW]  Fri Oct 23, 2016  0146 Pt reporting improvement with hour long neb, still going.  Decreased wheezing on exam.    [EW]  0305 Pt feeling much better.  Lungs CTAB.  [EW]    Clinical Course User Index [EW] Clayton Bibles, PA-C    Afebrile nontoxic patient with COPD, CHF p/w SOB/DOE/orthopnea x 3 days with dry cough and significant diffuse wheezing and decreased air movement throughout.  CXR negative.  Troponin negative.  EKG without ischemic changes.    Great improvement with solu-medrol and nebs  including hour-long neb.  Pt feeling better and comfortable going home.  D/C home with prednisone, hycodan, albuterol.  PCP follow up.  Discussed result, findings, treatment, and follow up  with patient.  Pt given return precautions.  Pt verbalizes understanding and agrees with plan.      Final Clinical Impressions(s) / ED Diagnoses   Final diagnoses:  COPD exacerbation ([place])    New Prescriptions Discharge Medication List as of 10/23/2016  3:09 AM    START taking these medications   Details  HYDROcodone-homatropine (HYCODAN) 5-1.5 MG/5ML syrup Take 5 mLs by mouth every 6 (six) hours as needed for cough., Starting Fri 10/23/2016, Print    predniSONE (DELTASONE) 50 MG tablet Take 1 tablet (50 mg total) by mouth daily., Starting Fri 10/23/2016, Until Wed 10/28/2016, [place], PA-C 10/23/16 [place], DO 10/23/16 1502

## 2016-10-23 LAB — INFLUENZA PANEL BY PCR (TYPE A & B)
Influenza A By PCR: NEGATIVE
Influenza B By PCR: NEGATIVE

## 2016-10-23 LAB — URINALYSIS, ROUTINE W REFLEX MICROSCOPIC
Bilirubin Urine: NEGATIVE
Glucose, UA: NEGATIVE mg/dL
Hgb urine dipstick: NEGATIVE
Ketones, ur: NEGATIVE mg/dL
Leukocytes, UA: NEGATIVE
Nitrite: NEGATIVE
Protein, ur: NEGATIVE mg/dL
Specific Gravity, Urine: 1.018 (ref 1.005–1.030)
pH: 5 (ref 5.0–8.0)

## 2016-10-23 MED ORDER — PREDNISONE 50 MG PO TABS: 50.0000 mg | ORAL_TABLET | Freq: Every day | ORAL | 0 refills | Status: AC

## 2016-10-23 MED ORDER — HYDROCODONE-HOMATROPINE 5-1.5 MG/5ML PO SYRP: 5.0000 mL | ORAL_SOLUTION | Freq: Four times a day (QID) | ORAL | 0 refills | Status: DC | PRN

## 2016-10-23 NOTE — Progress Notes (Signed)
  CAT started.  

## 2016-10-23 NOTE — Discharge Instructions (Signed)
Read the information below.  Use the prescribed medication as directed.  Please discuss all new medications with your pharmacist.  You may return to the Emergency Department at any time for worsening condition or any new symptoms that concern you.   If you develop worsening shortness of breath, uncontrolled wheezing, severe chest pain, or fevers despite using tylenol and/or ibuprofen, return for a recheck.     °

## 2016-10-24 LAB — URINE CULTURE

## 2016-10-26 ENCOUNTER — Telehealth: Payer: Self-pay

## 2016-10-26 NOTE — Telephone Encounter (Signed)
error 

## 2016-11-10 ENCOUNTER — Ambulatory Visit (INDEPENDENT_AMBULATORY_CARE_PROVIDER_SITE_OTHER): Payer: Medicaid Other | Admitting: Adult Health

## 2016-11-10 ENCOUNTER — Encounter: Payer: Self-pay | Admitting: Adult Health

## 2016-11-10 ENCOUNTER — Ambulatory Visit (INDEPENDENT_AMBULATORY_CARE_PROVIDER_SITE_OTHER): Payer: Medicaid Other | Admitting: Pulmonary Disease

## 2016-11-10 DIAGNOSIS — G4733 Obstructive sleep apnea (adult) (pediatric): Secondary | ICD-10-CM

## 2016-11-10 DIAGNOSIS — J4520 Mild intermittent asthma, uncomplicated: Secondary | ICD-10-CM | POA: Diagnosis not present

## 2016-11-10 LAB — PULMONARY FUNCTION TEST
DL/VA % pred: 102 %
DL/VA: 4.63 ml/min/mmHg/L
DLCO cor % pred: 94 %
DLCO cor: 20.37 ml/min/mmHg
DLCO unc % pred: 93 %
DLCO unc: 20.05 ml/min/mmHg
FEF 25-75 Post: 2.58 L/sec
FEF 25-75 Pre: 1.2 L/sec
FEF2575-%Change-Post: 114 %
FEF2575-%Pred-Post: 105 %
FEF2575-%Pred-Pre: 49 %
FEV1-%Change-Post: 24 %
FEV1-%Pred-Post: 92 %
FEV1-%Pred-Pre: 74 %
FEV1-Post: 2.06 L
FEV1-Pre: 1.66 L
FEV1FVC-%Change-Post: 9 %
FEV1FVC-%Pred-Pre: 89 %
FEV6-%Change-Post: 13 %
FEV6-%Pred-Post: 95 %
FEV6-%Pred-Pre: 84 %
FEV6-Post: 2.56 L
FEV6-Pre: 2.26 L
FEV6FVC-%Pred-Post: 103 %
FEV6FVC-%Pred-Pre: 103 %
FVC-%Change-Post: 13 %
FVC-%Pred-Post: 93 %
FVC-%Pred-Pre: 82 %
FVC-Post: 2.56 L
FVC-Pre: 2.26 L
Post FEV1/FVC ratio: 81 %
Post FEV6/FVC ratio: 100 %
Pre FEV1/FVC ratio: 74 %
Pre FEV6/FVC Ratio: 100 %
RV % pred: 135 %
RV: 2.19 L
TLC % pred: 99 %
TLC: 4.71 L

## 2016-11-10 MED ORDER — BECLOMETHASONE DIPROP HFA 80 MCG/ACT IN AERB: 2.0000 | INHALATION_SPRAY | Freq: Two times a day (BID) | RESPIRATORY_TRACT | 3 refills | Status: DC

## 2016-11-10 MED ORDER — BECLOMETHASONE DIPROP HFA 80 MCG/ACT IN AERB: 2.0000 | INHALATION_SPRAY | Freq: Two times a day (BID) | RESPIRATORY_TRACT | 0 refills | Status: DC

## 2016-11-10 NOTE — Progress Notes (Signed)
Patient seen in the office today and instructed on use of QVAR 80.  Patient expressed understanding and demonstrated technique.  Parke Poisson, CMA  11/10/16

## 2016-11-10 NOTE — Patient Instructions (Addendum)
Try nasal mask for CPAP w/ chin strap .  Try to wear at least 4 hr each night  Do not drive if sleepy.  Work on weight loss.  Try to quit smoking  Begin QVAR 2 puffs Twice daily  , rinse after use. -this is your controller medication.  Use Proair 2 puffs every 4hrs As needed  For wheezing , this is your rescue /emergency inhaler  Discuss with your heart doctor  that Lisinopril may be causing your cough to be worse. ,they may be able to change this to something else.  Follow up with Dr. [name]  In 2 months and As needed   Please contact office for sooner follow up if symptoms do not improve or worsen or seek emergency care

## 2016-11-10 NOTE — Progress Notes (Signed)
@Patient  ID: Lori Bean, female    DOB: Jul 15, 1971, 46 y.o.   MRN: [id number]  Chief Complaint  Patient presents with  . Follow-up    dyspnea     Referring provider: Benito Mccreedy, MD  HPI: 46 yo female former smoker  seen for sleep evaluation and dyspnea  09/23/16 for evaluation of sleep apnea.  PMH of HTN heart dz, Diastolic CHF , Bipolar and Schizophrenia   TEST  06/2016 sleep study AHI 26/hr   11/10/16 Follow up; OSA /Dyspnea Pt returns for 1 month follow up . She was seen last ov for OSA visit. She has known moderate OSA that was dx on sleep study in 06/2016 . She was recommended to begin CPAP . She has been having trouble wearing this . Feels the full face mask is too much for her. We discussed options such as alternative mask to for comfort . She would like to try nasal mask (add chin strap due to snoring ) .   She continues to smoke., we discussed smoking cesssation .  PFT today show min  airflow obstruction with BD reversibility.  Post BD FEV1 92% , ratio,81  FVC 93, 24% BD response, DLCO 93%  She does have daily dry cough on/off and intermittent wheezing that she can hear from her throat. Does get winded on/off with activities.  No chest pain, orthopnea, edema or hemoptyiss.  Chest xray earlier this month clear.  Echo in 2016 w/ nml EF, Gr 2 DD    Allergies  Allergen Reactions  . Aspirin Nausea And Vomiting    Immunization History  Administered Date(s) Administered  . Influenza Split 06/07/2013  . Pneumococcal Polysaccharide-23 04/03/2014  . Rabies, IM 05/04/2016, 05/12/2016    Past Medical History:  Diagnosis Date  . Anginal pain ([place])    occ from asthma  . Anxiety   . Arthritis   . Asthma   . Bipolar affective disorder ([place])    Schizophrenia  . Bronchitis    hx of  . CHF (congestive heart failure) ([place])   . Depression   . GERD (gastroesophageal reflux disease)   . Heart murmur   . Hypertension   . Neuropathy (H[facility])    left leg ,  "from back surgery"  . Overactive bladder   . Schizophrenia ([place])   . Sciatica   . Shortness of breath   . Snoring disorder    Pt stated my boyfriend always wakes me up and tells me to breathe    Tobacco History: History  Smoking Status  . Current Every Day Smoker  . Packs/day: 0.50  . Years: 24.00  . Types: Cigarettes  . Last attempt to quit: 05/23/2014  Smokeless Tobacco  . Never Used   Ready to quit: Not Answered Counseling given: Not Answered   Outpatient Encounter Prescriptions as of 11/10/2016  Medication Sig  . albuterol (PROAIR HFA) 108 (90 Base) MCG/ACT inhaler INHALE TWO PUFFS INTO THE LUNGS EVERY SIX HOURS AS NEEDED FOR WHEEZING OR SHORTNESS OF BREATH (Patient taking differently: Inhale 2 puffs into the lungs every 6 (six) hours as needed for wheezing or shortness of breath. )  . amLODipine (NORVASC) 10 MG tablet Take 1 tablet (10 mg total) by mouth daily.  . ARIPiprazole (ABILIFY) 20 MG tablet Take 1 tablet (20 mg total) by mouth at bedtime. (Patient taking differently: Take 20 mg by mouth 2 (two) times daily. )  . furosemide (LASIX) 80 MG tablet Take 80 mg by mouth 2 (two) times  daily.  [name] HYDROcodone-homatropine (HYCODAN) 5-1.5 MG/5ML syrup Take 5 mLs by mouth every 6 (six) hours as needed for cough.  [name] lisinopril (PRINIVIL,ZESTRIL) 20 MG tablet Take 1 tablet (20 mg total) by mouth daily.  . pregabalin (LYRICA) 150 MG capsule Take 150 mg by mouth daily.  [name] triamcinolone cream (KENALOG) 0.1 % Apply 1 application topically 2 (two) times daily as needed (foot rash).  . Beclomethasone Diprop HFA (QVAR REDIHALER) 80 MCG/ACT AERB Inhale 2 puffs into the lungs 2 (two) times daily.  . Beclomethasone Diprop HFA (QVAR REDIHALER) 80 MCG/ACT AERB Inhale 2 puffs into the lungs 2 (two) times daily.  [name] gabapentin (NEURONTIN) 300 MG capsule TAKE ONE (1) CAPSULE BY MOUTH 2 TIMES DAILY (Patient not taking: Reported on 11/10/2016)  . meloxicam (MOBIC) 7.5 MG tablet Take 2 tablets (15 mg  total) by mouth daily. (Patient not taking: Reported on 11/10/2016)  . potassium chloride 20 MEQ TBCR Take 20 mEq by mouth 2 (two) times daily. (Patient not taking: Reported on 11/10/2016)  . [DISCONTINUED] nitroGLYCERIN (NITROSTAT) SL tablet 0.4 mg    No facility-administered encounter medications on file as of 11/10/2016.      Review of Systems  Constitutional:   No  weight loss, night sweats,  Fevers, chills,  +fatigue, or  lassitude.  HEENT:   No headaches,  Difficulty swallowing,  Tooth/dental problems, or  Sore throat,                No sneezing, itching, ear ache,  +nasal congestion, post nasal drip,   CV:  No chest pain,  Orthopnea, PND, swelling in lower extremities, anasarca, dizziness, palpitations, syncope.   GI  No heartburn, indigestion, abdominal pain, nausea, vomiting, diarrhea, change in bowel habits, loss of appetite, bloody stools.   Resp: .  No wheezing.  No chest wall deformity  Skin: no rash or lesions.  GU: no dysuria, change in color of urine, no urgency or frequency.  No flank pain, no hematuria   MS:  No joint pain or swelling.  No decreased range of motion.  No back pain.    Physical Exam  BP (!) 144/84 (BP Location: Left Arm, Cuff Size: Large)   Pulse 85   Ht 5\' 2"  (1.575 m)   Wt 245 lb (111.1 kg)   SpO2 98%   BMI 44.81 kg/m   GEN: A/Ox3; pleasant , NAD,obese     HEENT:  [place]/AT,  EACs-clear, TMs-wnl, NOSE-clear, THROAT-clear, no lesions, no postnasal drip or exudate noted. Class 2-3 MP airway   NECK:  Supple w/ fair ROM; no JVD; normal carotid impulses w/o bruits; no thyromegaly or nodules palpated; no lymphadenopathy.    RESP  Clear  P & A; w/o, wheezes/ rales/ or rhonchi. no accessory muscle use, no dullness to percussion  CARD:  RRR, no m/r/g, tr  peripheral edema, pulses intact, no cyanosis or clubbing.  GI:   Soft & nt; nml bowel sounds; no organomegaly or masses detected.   Musco: Warm bil, no deformities or joint swelling noted.    Neuro: alert, no focal deficits noted.    Skin: Warm, no lesions or rashes   Lab Results:  CBC    Component Value Date/Time   WBC 10.9 (H) 10/22/2016 1801   RBC 4.18 10/22/2016 1801   HGB 13.0 10/22/2016 1801   HCT 38.7 10/22/2016 1801   PLT 257 10/22/2016 1801   MCV 92.6 10/22/2016 1801   MCH 31.1 10/22/2016 1801   MCHC 33.6 10/22/2016 1801  RDW 13.5 10/22/2016 1801   LYMPHSABS 2.2 06/24/2016 1235   MONOABS 1.1 (H) 06/24/2016 1235   EOSABS 0.4 06/24/2016 1235   BASOSABS 0.0 06/24/2016 1235    BMET    Component Value Date/Time   NA 139 10/22/2016 1801   K 3.9 10/22/2016 1801   CL 106 10/22/2016 1801   CO2 21 (L) 10/22/2016 1801   GLUCOSE 103 (H) 10/22/2016 1801   BUN 8 10/22/2016 1801   CREATININE 0.53 10/22/2016 1801   CREATININE 0.62 10/09/2016 0857   CALCIUM 9.5 10/22/2016 1801   GFRNONAA >60 10/22/2016 1801   GFRNONAA 69 06/19/2015 1150   GFRAA >60 10/22/2016 1801   GFRAA 80 06/19/2015 1150    BNP    Component Value Date/Time   BNP 13.2 10/22/2016 2234   BNP 15.2 09/18/2016 1058    ProBNP    Component Value Date/Time   PROBNP 137.6 (H) 04/03/2014 1502    Imaging: Dg Chest 2 View  Result Date: 10/22/2016 CLINICAL DATA:  Patient with shortness of breath for multiple days. Mid sternal chest pain. EXAM: CHEST  2 VIEW COMPARISON:  Chest radiograph 09/21/2016. FINDINGS: Normal cardiac and mediastinal contours. No consolidative pulmonary opacities. No pleural effusion or pneumothorax. Thoracic spine degenerative changes. IMPRESSION: No active cardiopulmonary disease. Electronically Signed   By: Lovey Newcomer M.D.   On: 10/22/2016 19:01     Assessment & Plan:   Extrinsic asthma Suspected asthma w/ reversibility on PFT  Smoking cessation is key  Add QVAR , control for triggers ACE may be aggravating cough/ might be worth changing to see if this helps , will leave to PCP/Heart Doctor . Plan  Patient Instructions  Try nasal mask for CPAP w/ chin  strap .  Try to wear at least 4 hr each night  Do not drive if sleepy.  Work on weight loss.  Try to quit smoking  Begin QVAR 2 puffs Twice daily  , rinse after use. -this is your controller medication.  Use Proair 2 puffs every 4hrs As needed  For wheezing , this is your rescue /emergency inhaler  Discuss with your heart that Lisinopril may be causing your cough to be worse. ,they may be able to change this to something else.  Follow up with Dr. [name]  In 2 months and As needed   Please contact office for sooner follow up if symptoms do not improve or worsen or seek emergency care      OSA (obstructive sleep apnea) New mask trial , pt education on CPAP   Plan  . Patient Instructions  Try nasal mask for CPAP w/ chin strap .  Try to wear at least 4 hr each night  Do not drive if sleepy.  Work on weight loss.  Try to quit smoking  Begin QVAR 2 puffs Twice daily  , rinse after use. -this is your controller medication.  Use Proair 2 puffs every 4hrs As needed  For wheezing , this is your rescue /emergency inhaler  Discuss with your heart doctor  that Lisinopril may be causing your cough to be worse. ,they may be able to change this to something else.  Follow up with Dr. [name]  In 2 months and As needed   Please contact office for sooner follow up if symptoms do not improve or worsen or seek emergency care         Rexene Edison, NP 11/11/2016

## 2016-11-11 ENCOUNTER — Ambulatory Visit: Payer: Medicaid Other | Admitting: Adult Health

## 2016-11-11 NOTE — Assessment & Plan Note (Addendum)
Suspected asthma w/ reversibility on PFT  Smoking cessation is key  Add QVAR , control for triggers ACE may be aggravating cough/ might be worth changing to see if this helps , will leave to PCP/Heart Doctor . Plan  Patient Instructions  Try nasal mask for CPAP w/ chin strap .  Try to wear at least 4 hr each night  Do not drive if sleepy.  Work on weight loss.  Try to quit smoking  Begin QVAR 2 puffs Twice daily  , rinse after use. -this is your controller medication.  Use Proair 2 puffs every 4hrs As needed  For wheezing , this is your rescue /emergency inhaler  Discuss with your heart that Lisinopril may be causing your cough to be worse. ,they may be able to change this to something else.  Follow up with Dr. [name]  In 2 months and As needed   Please contact office for sooner follow up if symptoms do not improve or worsen or seek emergency care

## 2016-11-11 NOTE — Assessment & Plan Note (Signed)
New mask trial , pt education on CPAP   Plan  . Patient Instructions  Try nasal mask for CPAP w/ chin strap .  Try to wear at least 4 hr each night  Do not drive if sleepy.  Work on weight loss.  Try to quit smoking  Begin QVAR 2 puffs Twice daily  , rinse after use. -this is your controller medication.  Use Proair 2 puffs every 4hrs As needed  For wheezing , this is your rescue /emergency inhaler  Discuss with your heart doctor  that Lisinopril may be causing your cough to be worse. ,they may be able to change this to something else.  Follow up with Dr. [name]  In 2 months and As needed   Please contact office for sooner follow up if symptoms do not improve or worsen or seek emergency care

## 2016-11-30 IMAGING — DX DG CHEST 2V
2 series · 2 of 2 positions shown · non-contrast
Comparison: Chest radiograph 04/03/2014

CLINICAL DATA: Patient with intense stabbing chest pain.

EXAM:
CHEST  2 VIEW

[chest pa]
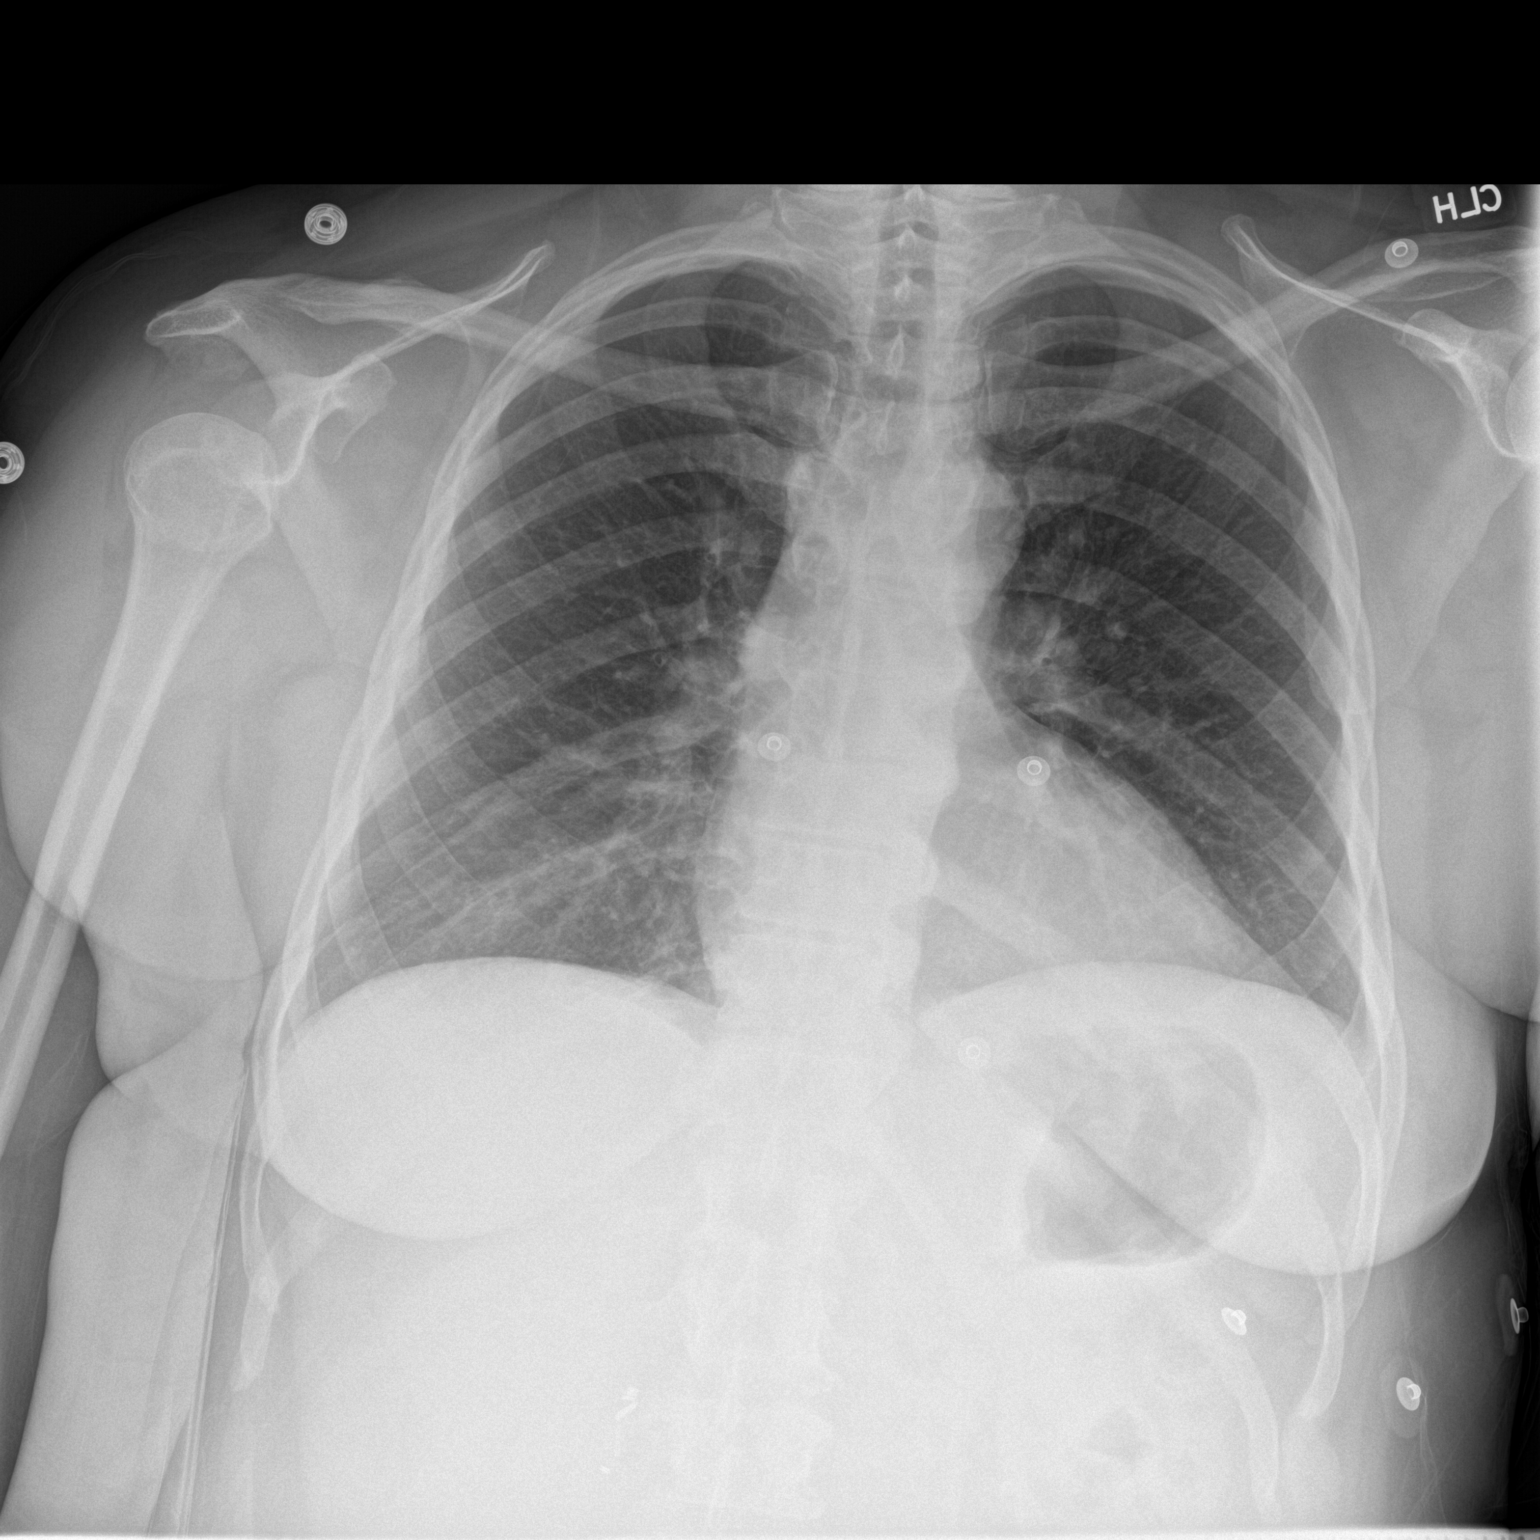

[chest lat]
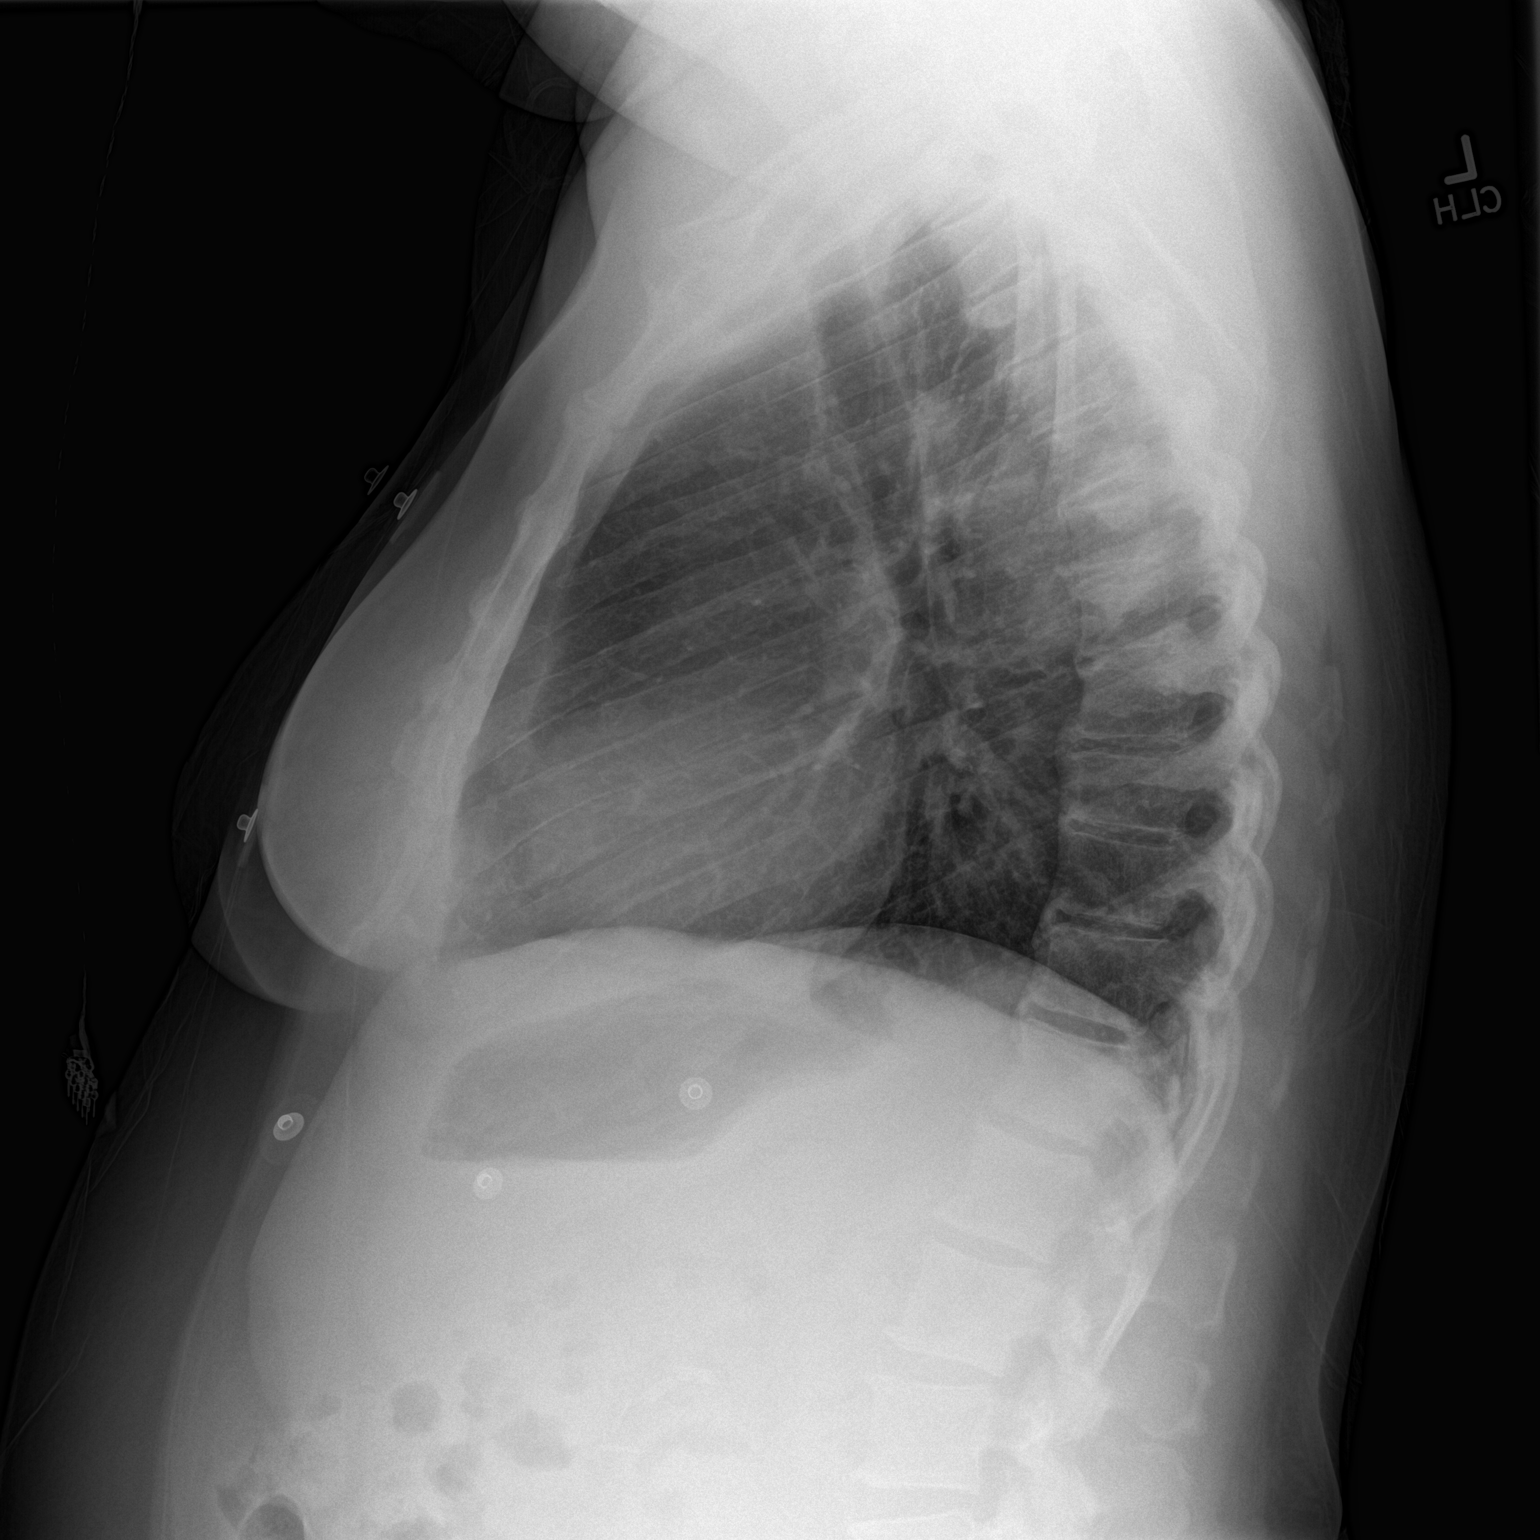

[2 of 2 positions shown; findings below may reference images not displayed]

FINDINGS: Stable cardiac and mediastinal contours. No consolidative pulmonary
opacities. No pleural effusion or pneumothorax. Mid thoracic spine
degenerative changes.
IMPRESSION: No acute cardiopulmonary process.

## 2016-12-17 ENCOUNTER — Encounter (HOSPITAL_COMMUNITY): Payer: Self-pay | Admitting: Emergency Medicine

## 2016-12-17 ENCOUNTER — Observation Stay (HOSPITAL_COMMUNITY)
Admission: EM | Admit: 2016-12-17 | Discharge: 2016-12-18 | Disposition: A | Discharge status: Home / Self Care | Payer: Medicaid Other | Attending: Internal Medicine | Admitting: Internal Medicine

## 2016-12-17 ENCOUNTER — Emergency Department (HOSPITAL_COMMUNITY): Payer: Medicaid Other

## 2016-12-17 DIAGNOSIS — G4733 Obstructive sleep apnea (adult) (pediatric): Secondary | ICD-10-CM | POA: Diagnosis not present

## 2016-12-17 DIAGNOSIS — R05 Cough: Secondary | ICD-10-CM | POA: Insufficient documentation

## 2016-12-17 DIAGNOSIS — F419 Anxiety disorder, unspecified: Secondary | ICD-10-CM | POA: Diagnosis not present

## 2016-12-17 DIAGNOSIS — I5032 Chronic diastolic (congestive) heart failure: Secondary | ICD-10-CM | POA: Diagnosis not present

## 2016-12-17 DIAGNOSIS — Z6841 Body Mass Index (BMI) 40.0 and over, adult: Secondary | ICD-10-CM | POA: Insufficient documentation

## 2016-12-17 DIAGNOSIS — J4531 Mild persistent asthma with (acute) exacerbation: Secondary | ICD-10-CM | POA: Diagnosis not present

## 2016-12-17 DIAGNOSIS — Z7951 Long term (current) use of inhaled steroids: Secondary | ICD-10-CM | POA: Insufficient documentation

## 2016-12-17 DIAGNOSIS — F209 Schizophrenia, unspecified: Secondary | ICD-10-CM | POA: Diagnosis not present

## 2016-12-17 DIAGNOSIS — F319 Bipolar disorder, unspecified: Secondary | ICD-10-CM | POA: Insufficient documentation

## 2016-12-17 DIAGNOSIS — F329 Major depressive disorder, single episode, unspecified: Secondary | ICD-10-CM | POA: Insufficient documentation

## 2016-12-17 DIAGNOSIS — K219 Gastro-esophageal reflux disease without esophagitis: Secondary | ICD-10-CM | POA: Insufficient documentation

## 2016-12-17 DIAGNOSIS — R7303 Prediabetes: Secondary | ICD-10-CM | POA: Diagnosis not present

## 2016-12-17 DIAGNOSIS — M1990 Unspecified osteoarthritis, unspecified site: Secondary | ICD-10-CM | POA: Diagnosis not present

## 2016-12-17 DIAGNOSIS — I10 Essential (primary) hypertension: Secondary | ICD-10-CM | POA: Diagnosis present

## 2016-12-17 DIAGNOSIS — J45909 Unspecified asthma, uncomplicated: Secondary | ICD-10-CM | POA: Diagnosis not present

## 2016-12-17 DIAGNOSIS — Z79899 Other long term (current) drug therapy: Secondary | ICD-10-CM | POA: Insufficient documentation

## 2016-12-17 DIAGNOSIS — R0602 Shortness of breath: Secondary | ICD-10-CM | POA: Diagnosis not present

## 2016-12-17 DIAGNOSIS — I110 Hypertensive heart disease with heart failure: Secondary | ICD-10-CM | POA: Insufficient documentation

## 2016-12-17 DIAGNOSIS — J45901 Unspecified asthma with (acute) exacerbation: Secondary | ICD-10-CM | POA: Diagnosis present

## 2016-12-17 LAB — CBC
HCT: 36.8 % (ref 36.0–46.0)
Hemoglobin: 12 g/dL (ref 12.0–15.0)
MCH: 30.8 pg (ref 26.0–34.0)
MCHC: 32.6 g/dL (ref 30.0–36.0)
MCV: 94.4 fL (ref 78.0–100.0)
Platelets: 274 10*3/uL (ref 150–400)
RBC: 3.9 MIL/uL (ref 3.87–5.11)
RDW: 14.3 % (ref 11.5–15.5)
WBC: 11.6 10*3/uL — ABNORMAL HIGH (ref 4.0–10.5)

## 2016-12-17 LAB — BASIC METABOLIC PANEL
Anion gap: 7 (ref 5–15)
BUN: 11 mg/dL (ref 6–20)
CO2: 26 mmol/L (ref 22–32)
Calcium: 8.9 mg/dL (ref 8.9–10.3)
Chloride: 106 mmol/L (ref 101–111)
Creatinine, Ser: 0.76 mg/dL (ref 0.44–1.00)
GFR calc Af Amer: >60 mL/min (ref >60)
GFR calc non Af Amer: >60 mL/min (ref >60)
Glucose, Bld: 105 mg/dL — ABNORMAL HIGH (ref 65–99)
Potassium: 4.1 mmol/L (ref 3.5–5.1)
Sodium: 139 mmol/L (ref 135–145)

## 2016-12-17 LAB — I-STAT TROPONIN, ED: Troponin i, poc: 0 ng/mL (ref 0.00–0.08)

## 2016-12-17 LAB — BRAIN NATRIURETIC PEPTIDE: B Natriuretic Peptide: 24.8 pg/mL (ref 0.0–100.0)

## 2016-12-17 LAB — TROPONIN I: Troponin I: <0.03 ng/mL (ref <0.03)

## 2016-12-17 MED ORDER — BUDESONIDE 0.25 MG/2ML IN SUSP
0.2500 mg | Freq: Two times a day (BID) | RESPIRATORY_TRACT | Status: DC
  Administered 2016-12-18: 0.25 mg via RESPIRATORY_TRACT
  Filled 2016-12-17 (×3): qty 2

## 2016-12-17 MED ORDER — ALBUTEROL (5 MG/ML) CONTINUOUS INHALATION SOLN
15.0000 mg/h | INHALATION_SOLUTION | Freq: Once | RESPIRATORY_TRACT | Status: AC
  Administered 2016-12-17: 15 mg/h via RESPIRATORY_TRACT
  Filled 2016-12-17: qty 20

## 2016-12-17 MED ORDER — IPRATROPIUM BROMIDE 0.02 % IN SOLN
1.0000 mg | Freq: Once | RESPIRATORY_TRACT | Status: AC
  Administered 2016-12-17: 1 mg via RESPIRATORY_TRACT
  Filled 2016-12-17: qty 5

## 2016-12-17 MED ORDER — ARIPIPRAZOLE 10 MG PO TABS
20.0000 mg | ORAL_TABLET | Freq: Two times a day (BID) | ORAL | Status: DC
  Administered 2016-12-18 (×2): 20 mg via ORAL
  Filled 2016-12-17 (×2): qty 2

## 2016-12-17 MED ORDER — BECLOMETHASONE DIPROP HFA 80 MCG/ACT IN AERB: 2.0000 | INHALATION_SPRAY | Freq: Two times a day (BID) | RESPIRATORY_TRACT | Status: DC

## 2016-12-17 MED ORDER — POTASSIUM CHLORIDE CRYS ER 20 MEQ PO TBCR
20.0000 meq | EXTENDED_RELEASE_TABLET | Freq: Two times a day (BID) | ORAL | Status: DC
  Administered 2016-12-18 (×2): 20 meq via ORAL
  Filled 2016-12-17 (×2): qty 1

## 2016-12-17 MED ORDER — PREDNISONE 20 MG PO TABS
40.0000 mg | ORAL_TABLET | Freq: Every day | ORAL | Status: DC
  Administered 2016-12-18: 40 mg via ORAL
  Filled 2016-12-17: qty 2

## 2016-12-17 MED ORDER — FUROSEMIDE 80 MG PO TABS
80.0000 mg | ORAL_TABLET | Freq: Two times a day (BID) | ORAL | Status: DC
  Administered 2016-12-18: 80 mg via ORAL
  Filled 2016-12-17: qty 1

## 2016-12-17 MED ORDER — TRAMADOL HCL 50 MG PO TABS
50.0000 mg | ORAL_TABLET | Freq: Four times a day (QID) | ORAL | Status: DC | PRN
  Administered 2016-12-18 (×2): 50 mg via ORAL
  Filled 2016-12-17 (×2): qty 1

## 2016-12-17 MED ORDER — ENOXAPARIN SODIUM 40 MG/0.4ML ~~LOC~~ SOLN
40.0000 mg | SUBCUTANEOUS | Status: DC
  Administered 2016-12-18: 40 mg via SUBCUTANEOUS
  Filled 2016-12-17: qty 0.4

## 2016-12-17 MED ORDER — ALBUTEROL SULFATE (2.5 MG/3ML) 0.083% IN NEBU: 2.5000 mg | INHALATION_SOLUTION | Freq: Four times a day (QID) | RESPIRATORY_TRACT | Status: DC | PRN

## 2016-12-17 MED ORDER — PREGABALIN 75 MG PO CAPS
150.0000 mg | ORAL_CAPSULE | Freq: Every day | ORAL | Status: DC
Start: 2016-12-18 — End: 2016-12-18
  Administered 2016-12-18: 150 mg via ORAL
  Filled 2016-12-17: qty 2

## 2016-12-17 MED ORDER — DARIFENACIN HYDROBROMIDE ER 15 MG PO TB24
15.0000 mg | ORAL_TABLET | Freq: Every day | ORAL | Status: DC
Start: 2016-12-18 — End: 2016-12-18
  Administered 2016-12-18: 15 mg via ORAL
  Filled 2016-12-17: qty 1

## 2016-12-17 MED ORDER — AMLODIPINE BESYLATE 10 MG PO TABS
10.0000 mg | ORAL_TABLET | Freq: Every day | ORAL | Status: DC
  Administered 2016-12-18: 10 mg via ORAL
  Filled 2016-12-17: qty 1

## 2016-12-17 MED ORDER — METHYLPREDNISOLONE SODIUM SUCC 125 MG IJ SOLR
125.0000 mg | Freq: Once | INTRAMUSCULAR | Status: AC
  Administered 2016-12-17: 125 mg via INTRAVENOUS
  Filled 2016-12-17: qty 2

## 2016-12-17 MED ORDER — FLUTICASONE FUROATE-VILANTEROL 200-25 MCG/INH IN AEPB
1.0000 | INHALATION_SPRAY | Freq: Every day | RESPIRATORY_TRACT | Status: DC
  Administered 2016-12-18: 1 via RESPIRATORY_TRACT
  Filled 2016-12-17: qty 28

## 2016-12-17 MED ORDER — POTASSIUM CHLORIDE ER 20 MEQ PO TBCR: 20.0000 meq | EXTENDED_RELEASE_TABLET | Freq: Two times a day (BID) | ORAL | Status: DC

## 2016-12-17 MED ORDER — IBUPROFEN 400 MG PO TABS
600.0000 mg | ORAL_TABLET | Freq: Once | ORAL | Status: AC
  Administered 2016-12-17: 600 mg via ORAL
  Filled 2016-12-17: qty 1

## 2016-12-17 NOTE — ED Notes (Signed)
Patient given food and drink at this time.  

## 2016-12-17 NOTE — ED Notes (Addendum)
Pt lying in bed with room air- O2 sat 97%; pt up to side of bed and became slightly dizzy; pt ambulated with pulse ox to nurses station O2 sat dropped to 95%, pt returned to room, increased WOB, diaphoretic; O2 sat returned to 97-98%; MD Caudill informed

## 2016-12-17 NOTE — H&P (Signed)
History and Physical    CRESCENT GOTHAM [id number] DOB: 02/02/1971 DOA: 12/17/2016  PCP: Benito Mccreedy, MD  Patient coming from: Home  I have personally briefly reviewed patient's old medical records in [place]  Chief Complaint: SOB  HPI: Lori Bean is a 46 y.o. female with medical history significant of Asthma and OSA (reversibility demonstrated on PFTs in Feb this year), smoking, HTN, CHF grade 2 diastolic dysfunction.  Patient presents to the ED with c/o SOB.  Symptoms onset at rest yesterday, also has 10lb weight gain over past 3 weeks but states she is compliant with lasix 80 BID.  2 albuterol nebs before seeing PCP this AM.  PCP sent her to ED for evaluation.   ED Course: Wheezing improved after solumedrol, neb treatments.  Still some wheezing and very SOB on exertion.  CXR clear, BNP nl.   Review of Systems: As per HPI otherwise 10 point review of systems negative.   Past Medical History:  Diagnosis Date  . Anginal pain ([place])    occ from asthma  . Anxiety   . Arthritis   . Asthma   . Bipolar affective disorder ([place])    Schizophrenia  . Bronchitis    hx of  . CHF (congestive heart failure) ([place])   . Depression   . GERD (gastroesophageal reflux disease)   . Heart murmur   . Hypertension   . Neuropathy ([facility])    left leg , "from back surgery"  . Overactive bladder   . Schizophrenia ([place])   . Sciatica   . Shortness of breath   . Snoring disorder    Pt stated my boyfriend always wakes me up and tells me to breathe    Past Surgical History:  Procedure Laterality Date  . BACK SURGERY     3 back surgeries  . CHOLECYSTECTOMY    . COLONOSCOPY WITH PROPOFOL N/A 01/23/2016   Procedure: COLONOSCOPY WITH PROPOFOL;  Surgeon: Wonda Horner, MD;  Location: [facility];  Service: Endoscopy;  Laterality: N/A;  . EYE SURGERY     Metal plate in right eye. Had fracture in right eye  . gallstones reomved    . KNEE ARTHROSCOPY WITH MENISCAL REPAIR  Right 09/28/2016   Procedure: KNEE ARTHROSCOPY WITH MENISCAL REPAIR;  Surgeon: Dorna Leitz, MD;  Location: [facility];  Service: Orthopedics;  Laterality: Right;  Right partial meniscectomy and chondroplasty, patellar/femoral joint and medial femoral condyle   . LUMBAR LAMINECTOMY/DECOMPRESSION MICRODISCECTOMY  04/01/2012   Procedure: LUMBAR LAMINECTOMY/DECOMPRESSION MICRODISCECTOMY 2 LEVELS;  Surgeon: Faythe Ghee, MD;  Location: [facility];  Service: Neurosurgery;  Laterality: Left;  Lumbar four-five, lumbar five sacral one microdiscectomy   . LUMBAR WOUND DEBRIDEMENT  04/29/2012   Procedure: LUMBAR WOUND DEBRIDEMENT;  Surgeon: Faythe Ghee, MD;  Location: [facility];  Service: Neurosurgery;  Laterality: N/A;  lumbar wound debridement  . ROTATOR CUFF REPAIR     Right shoulder  . TUBAL LIGATION       reports that she has been smoking Cigarettes.  She has a 12.00 pack-year smoking history. She has never used smokeless tobacco. She reports that she does not drink alcohol or use drugs.  Allergies  Allergen Reactions  . Aspirin Nausea And Vomiting    Family History  Problem Relation Age of Onset  . Diabetes Mother   . Hypertension Mother   . Diabetes Father   . Heart disease Paternal Aunt   . Cancer Paternal Aunt  Prior to Admission medications   Medication Sig Start Date End Date Taking? Authorizing Provider  ADVAIR DISKUS 250-50 MCG/DOSE AEPB Inhale 1 puff into the lungs 2 (two) times daily. 12/11/16  Yes Historical Provider, MD  albuterol (PROAIR HFA) 108 (90 Base) MCG/ACT inhaler INHALE TWO PUFFS INTO THE LUNGS EVERY SIX HOURS AS NEEDED FOR WHEEZING OR SHORTNESS OF BREATH Patient taking differently: Inhale 2 puffs into the lungs every 6 (six) hours as needed for wheezing or shortness of breath.  11/07/15  Yes Lance Bosch, NP  amLODipine (NORVASC) 10 MG tablet Take 1 tablet (10 mg total) by mouth daily. 10/09/16 01/07/17 Yes Rhonda G Barrett, PA-C  ARIPiprazole (ABILIFY) 20 MG  tablet Take 1 tablet (20 mg total) by mouth at bedtime. Patient taking differently: Take 20 mg by mouth 2 (two) times daily.  03/04/15  Yes Lance Bosch, NP  Beclomethasone Diprop HFA (QVAR REDIHALER) 80 MCG/ACT AERB Inhale 2 puffs into the lungs 2 (two) times daily. 11/10/16  Yes [name] S Parrett, NP  furosemide (LASIX) 80 MG tablet Take 80 mg by mouth 2 (two) times daily.   Yes Historical Provider, MD  pregabalin (LYRICA) 150 MG capsule Take 150 mg by mouth daily.   Yes Historical Provider, MD  traMADol (ULTRAM) 50 MG tablet Take 50 mg by mouth 4 (four) times daily as needed for pain. 12/03/16  Yes Historical Provider, MD  triamcinolone cream (KENALOG) 0.1 % Apply 1 application topically 2 (two) times daily as needed (foot rash). 11/07/15  Yes Lance Bosch, NP  VESICARE 10 MG tablet Take 10 mg by mouth daily. 11/13/16  Yes Historical Provider, MD  potassium chloride 20 MEQ TBCR Take 20 mEq by mouth 2 (two) times daily. Patient not taking: Reported on 12/17/2016 05/26/15   Eileen Stanford, PA-C    Physical Exam: Vitals:   12/17/16 2000 12/17/16 2030 12/17/16 2100 12/17/16 2130  BP: 136/65 (!) 143/61 138/61 (!) 156/76  Pulse: (!) 116 (!) 111 (!) 111 (!) 110  Resp: 17 (!) 35 (!) 31 (!) 24  Temp:      TempSrc:      SpO2: 93% 98% 96% 99%  Weight:      Height:        Constitutional: NAD, calm, comfortable Eyes: PERRL, lids and conjunctivae normal ENMT: Mucous membranes are moist. Posterior pharynx clear of any exudate or lesions.Normal dentition.  Neck: normal, supple, no masses, no thyromegaly Respiratory: Diffuse wheezing. Cardiovascular: Regular rate and rhythm, no murmurs / rubs / gallops. No extremity edema. 2+ pedal pulses. No carotid bruits.  Abdomen: no tenderness, no masses palpated. No hepatosplenomegaly. Bowel sounds positive.  Musculoskeletal: no clubbing / cyanosis. No joint deformity upper and lower extremities. Good ROM, no contractures. Normal muscle tone.  Skin: no rashes,  lesions, ulcers. No induration Neurologic: CN 2-12 grossly intact. Sensation intact, DTR normal. Strength 5/5 in all 4.  Psychiatric: Normal judgment and insight. Alert and oriented x 3. Normal mood.    Labs on Admission: I have personally reviewed following labs and imaging studies  CBC:  Recent Labs Lab 12/17/16 1534  WBC 11.6*  HGB 12.0  HCT 36.8  MCV 94.4  PLT 944   Basic Metabolic Panel:  Recent Labs Lab 12/17/16 1534  NA 139  K 4.1  CL 106  CO2 26  GLUCOSE 105*  BUN 11  CREATININE 0.76  CALCIUM 8.9   GFR: Estimated Creatinine Clearance: 106.4 mL/min (by C-G formula based on SCr of 0.76 mg/dL).  Liver Function Tests: No results for input(s): AST, ALT, ALKPHOS, BILITOT, PROT, ALBUMIN in the last 168 hours. No results for input(s): LIPASE, AMYLASE in the last 168 hours. No results for input(s): AMMONIA in the last 168 hours. Coagulation Profile: No results for input(s): INR, PROTIME in the last 168 hours. Cardiac Enzymes:  Recent Labs Lab 12/17/16 1758  TROPONINI <0.03   BNP (last 3 results) No results for input(s): PROBNP in the last 8760 hours. HbA1C: No results for input(s): HGBA1C in the last 72 hours. CBG: No results for input(s): GLUCAP in the last 168 hours. Lipid Profile: No results for input(s): CHOL, HDL, LDLCALC, TRIG, CHOLHDL, LDLDIRECT in the last 72 hours. Thyroid Function Tests: No results for input(s): TSH, T4TOTAL, FREET4, T3FREE, THYROIDAB in the last 72 hours. Anemia Panel: No results for input(s): VITAMINB12, FOLATE, FERRITIN, TIBC, IRON, RETICCTPCT in the last 72 hours. Urine analysis:    Component Value Date/Time   COLORURINE YELLOW 10/23/2016 0227   APPEARANCEUR HAZY (A) 10/23/2016 0227   LABSPEC 1.018 10/23/2016 0227   PHURINE 5.0 10/23/2016 0227   GLUCOSEU NEGATIVE 10/23/2016 0227   HGBUR NEGATIVE 10/23/2016 0227   BILIRUBINUR NEGATIVE 10/23/2016 0227   BILIRUBINUR neg 06/19/2015 1142   KETONESUR NEGATIVE 10/23/2016 0227    PROTEINUR NEGATIVE 10/23/2016 0227   UROBILINOGEN 0.2 06/19/2015 1142   UROBILINOGEN 0.2 08/24/2013 1416   NITRITE NEGATIVE 10/23/2016 0227   LEUKOCYTESUR NEGATIVE 10/23/2016 0227    Radiological Exams on Admission: Dg Chest 2 View  Result Date: 12/17/2016 CLINICAL DATA:  Shortness of breath and 10 pound weight gain over the past 2 weeks. EXAM: CHEST  2 VIEW COMPARISON:  PA and lateral chest 10/22/2016 and 12/23/2015. CT chest 08/17/2015. FINDINGS: The lungs are clear. Heart size is normal. No pneumothorax or pleural effusion. No acute bony abnormality. IMPRESSION: No acute disease. Electronically Signed   By: Inge Rise M.D.   On: 12/17/2016 16:00    EKG: Independently reviewed.  Assessment/Plan Principal Problem:   Asthma exacerbation Active Problems:   Hypertension   Chronic diastolic CHF (congestive heart failure) (HCC)   Prediabetes    1. Asthma exacerbation - 1. Adult wheeze protocol for albuterol treatments 2. Prednisone 3. Continue home inhalers 2. HTN - continue home meds 3. Chronic diastolic CHF - Continue lasix 4. Prediabetes - keep an eye on BMP while inpatient and on steroids.  BMP repeat ordered for AM.  DVT prophylaxis: Lovenox Code Status: Full Family Communication: No family in room Disposition Plan: home after admit Consults called: None Admission status: Place in obs   [place], [place] [facility] Pager [phone]  If 7AM-7PM, please contact day team taking care of patient [email or web address] Password [email or web address]  12/17/2016, 10:46 PM

## 2016-12-17 NOTE — ED Triage Notes (Signed)
Pt was sent from PCP due to shortness of breath and 10lb weight gain over the last 2 weeks. Pt has history of CHF. Pt also having sharp substernal chest pains.

## 2016-12-17 NOTE — ED Notes (Signed)
No addl blood draw,  Pt enroute to inpatient. 

## 2016-12-17 NOTE — ED Provider Notes (Signed)
[facility] DEPT Provider Note   CSN: [id number] Arrival date & time: 12/17/16  1524     History   Chief Complaint Chief Complaint  Patient presents with  . Shortness of Breath    HPI Lori Bean is a 46 y.o. female PMH asthma, CHF, OSA (nightly CPAP) presents for dyspnea, sharp substernal chest pain. Pt notes chest pain started at rest yesterday. Substernal, sharp and lasts for 5-7 minutes before spontaneous resolution. This is similar to previous chest pain, no hx CAD. Pt also notes increased dyspnea on exertion and 10 lb weight gain for 3 weeks. Notes compliance with 80 mg lasix BID as well as CPAP. She took 2 albuterol nebs before seeing her PCP this morning. PCP advised presentation to ED for evaluation.   HPI  Past Medical History:  Diagnosis Date  . Anginal pain ([place])    occ from asthma  . Anxiety   . Arthritis   . Asthma   . Bipolar affective disorder ([place])    Schizophrenia  . Bronchitis    hx of  . CHF (congestive heart failure) ([place])   . Depression   . GERD (gastroesophageal reflux disease)   . Heart murmur   . Hypertension   . Neuropathy ([facility])    left leg , "from back surgery"  . Overactive bladder   . Schizophrenia ([place])   . Sciatica   . Shortness of breath   . Snoring disorder    Pt stated my boyfriend always wakes me up and tells me to breathe    Patient Active Problem List   Diagnosis Date Noted  . Asthma exacerbation 12/17/2016  . 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 Synovial plica of right knee 98/33/8250  . Tear of medial meniscus of right knee, initial encounter 09/28/2016  . OSA (obstructive sleep apnea) 09/23/2016  . Chronic diastolic CHF (congestive heart failure) ([place]) 04/22/2016  . Prediabetes 04/22/2016  . Tobacco abuse disorder 04/03/2014  . Obesity (BMI 30-39.9) 04/03/2014  . Chest pain 04/03/2014  . Mild cardiomegaly 02/21/2014  . Lumbar spondylosis 12/28/2013  . Extrinsic asthma 06/07/2013  . Anxiety   .  Hypertension   . Bipolar affective disorder ([place])   . GERD (gastroesophageal reflux disease)   . Arthritis   . Schizophrenia ([place])   . Overactive bladder     Past Surgical History:  Procedure Laterality Date  . BACK SURGERY     3 back surgeries  . CHOLECYSTECTOMY    . COLONOSCOPY WITH PROPOFOL N/A 01/23/2016   Procedure: COLONOSCOPY WITH PROPOFOL;  Surgeon: Wonda Horner, MD;  Location: [facility];  Service: Endoscopy;  Laterality: N/A;  . EYE SURGERY     Metal plate in right eye. Had fracture in right eye  . gallstones reomved    . KNEE ARTHROSCOPY WITH MENISCAL REPAIR Right 09/28/2016   Procedure: KNEE ARTHROSCOPY WITH MENISCAL REPAIR;  Surgeon: Dorna Leitz, MD;  Location: [facility];  Service: Orthopedics;  Laterality: Right;  Right partial meniscectomy and chondroplasty, patellar/femoral joint and medial femoral condyle   . LUMBAR LAMINECTOMY/DECOMPRESSION MICRODISCECTOMY  04/01/2012   Procedure: LUMBAR LAMINECTOMY/DECOMPRESSION MICRODISCECTOMY 2 LEVELS;  Surgeon: Faythe Ghee, MD;  Location: [facility];  Service: Neurosurgery;  Laterality: Left;  Lumbar four-five, lumbar five sacral one microdiscectomy   . LUMBAR WOUND DEBRIDEMENT  04/29/2012   Procedure: LUMBAR WOUND DEBRIDEMENT;  Surgeon: Faythe Ghee, MD;  Location: [facility];  Service: Neurosurgery;  Laterality: N/A;  lumbar wound debridement  . ROTATOR CUFF  REPAIR     Right shoulder  . TUBAL LIGATION      OB History    No data available       Home Medications    Prior to Admission medications   Medication Sig Start Date End Date Taking? Authorizing Provider  ADVAIR DISKUS 250-50 MCG/DOSE AEPB Inhale 1 puff into the lungs 2 (two) times daily. 12/11/16  Yes Historical Provider, MD  albuterol (PROAIR HFA) 108 (90 Base) MCG/ACT inhaler INHALE TWO PUFFS INTO THE LUNGS EVERY SIX HOURS AS NEEDED FOR WHEEZING OR SHORTNESS OF BREATH Patient taking differently: Inhale 2 puffs into the lungs every 6 (six) hours as needed  for wheezing or shortness of breath.  11/07/15  Yes Lance Bosch, NP  amLODipine (NORVASC) 10 MG tablet Take 1 tablet (10 mg total) by mouth daily. 10/09/16 01/07/17 Yes Rhonda G Barrett, PA-C  ARIPiprazole (ABILIFY) 20 MG tablet Take 1 tablet (20 mg total) by mouth at bedtime. Patient taking differently: Take 20 mg by mouth 2 (two) times daily.  03/04/15  Yes Lance Bosch, NP  Beclomethasone Diprop HFA (QVAR REDIHALER) 80 MCG/ACT AERB Inhale 2 puffs into the lungs 2 (two) times daily. 11/10/16  Yes [name] S Parrett, NP  furosemide (LASIX) 80 MG tablet Take 80 mg by mouth 2 (two) times daily.   Yes Historical Provider, MD  pregabalin (LYRICA) 150 MG capsule Take 150 mg by mouth daily.   Yes Historical Provider, MD  traMADol (ULTRAM) 50 MG tablet Take 50 mg by mouth 4 (four) times daily as needed for pain. 12/03/16  Yes Historical Provider, MD  triamcinolone cream (KENALOG) 0.1 % Apply 1 application topically 2 (two) times daily as needed (foot rash). 11/07/15  Yes Lance Bosch, NP  VESICARE 10 MG tablet Take 10 mg by mouth daily. 11/13/16  Yes Historical Provider, MD  potassium chloride 20 MEQ TBCR Take 20 mEq by mouth 2 (two) times daily. Patient not taking: Reported on 12/17/2016 05/26/15   Eileen Stanford, PA-C    Family History Family History  Problem Relation Age of Onset  . Diabetes Mother   . Hypertension Mother   . Diabetes Father   . Heart disease Paternal Aunt   . Cancer Paternal Aunt     Social History Social History  Substance Use Topics  . Smoking status: Current Every Day Smoker    Packs/day: 0.50    Years: 24.00    Types: Cigarettes    Last attempt to quit: 05/23/2014  . Smokeless tobacco: Never Used  . Alcohol use No     Comment: quit Nov. 2014     Allergies   Aspirin   Review of Systems Review of Systems  Constitutional: Negative for chills and fever.  Respiratory: Positive for shortness of breath. Negative for cough.   Cardiovascular: Positive for chest pain  and leg swelling.  Gastrointestinal: Negative for abdominal pain, nausea and vomiting.  Musculoskeletal: Negative for myalgias.  Neurological: Negative for headaches.     Physical Exam Updated Vital Signs BP 112/66   Pulse 98   Temp 98.9 F (37.2 C) (Oral)   Resp (!) 29   Ht 5\' 2"  (1.575 m)   Wt 116.6 kg   SpO2 95%   BMI 47.01 kg/m   Physical Exam  Constitutional: She is oriented to person, place, and time. She appears well-developed and well-nourished. No distress.  HENT:  Head: Normocephalic and atraumatic.  Mouth/Throat: Oropharynx is clear and moist.  Eyes: Conjunctivae and EOM  are normal.  Neck: Normal range of motion. Neck supple.  Cardiovascular: Normal rate and regular rhythm.   No murmur heard. Pulmonary/Chest: Effort normal. No accessory muscle usage. Tachypnea noted. No respiratory distress. She has decreased breath sounds. She has wheezes (diffuse expiratory). She has no rhonchi. She has no rales.  Abdominal: Soft. There is no tenderness.  Musculoskeletal: Normal range of motion. She exhibits edema (1+ pretibial b/l ).  Neurological: She is alert and oriented to person, place, and time.  Skin: Skin is warm and dry. Capillary refill takes less than 2 seconds.  Psychiatric: She has a normal mood and affect.  Nursing note and vitals reviewed.    ED Treatments / Results  Labs (all labs ordered are listed, but only abnormal results are displayed) Labs Reviewed  BASIC METABOLIC PANEL - Abnormal; Notable for the following:       Result Value   Glucose, Bld 105 (*)    All other components within normal limits  CBC - Abnormal; Notable for the following:    WBC 11.6 (*)    All other components within normal limits  BRAIN NATRIURETIC PEPTIDE  TROPONIN I  BASIC METABOLIC PANEL  HIV ANTIBODY (ROUTINE TESTING)  I-STAT TROPOININ, ED    EKG  EKG Interpretation  Date/Time:  Thursday December 17 2016 15:29:10 EDT Ventricular Rate:  84 PR Interval:  186 QRS  Duration: 86 QT Interval:  394 QTC Calculation: 465 R Axis:   51 Text Interpretation:  Normal sinus rhythm Cannot rule out Anterior infarct , age undetermined Abnormal ECG Technically poor tracing 2/2 artifact Confirmed by [facility] MD, Corene Cornea [phone]) on 12/17/2016 6:15:13 PM       Radiology Dg Chest 2 View  Result Date: 12/17/2016 CLINICAL DATA:  Shortness of breath and 10 pound weight gain over the past 2 weeks. EXAM: CHEST  2 VIEW COMPARISON:  PA and lateral chest 10/22/2016 and 12/23/2015. CT chest 08/17/2015. FINDINGS: The lungs are clear. Heart size is normal. No pneumothorax or pleural effusion. No acute bony abnormality. IMPRESSION: No acute disease. Electronically Signed   By: Inge Rise M.D.   On: 12/17/2016 16:00    Procedures Procedures (including critical care time)  Medications Ordered in ED Medications  predniSONE (DELTASONE) tablet 40 mg (not administered)  albuterol (PROVENTIL) (2.5 MG/3ML) 0.083% nebulizer solution 2.5 mg (not administered)  fluticasone furoate-vilanterol (BREO ELLIPTA) 200-25 MCG/INH 1 puff (not administered)  amLODipine (NORVASC) tablet 10 mg (not administered)  ARIPiprazole (ABILIFY) tablet 20 mg (not administered)  furosemide (LASIX) tablet 80 mg (not administered)  pregabalin (LYRICA) capsule 150 mg (not administered)  traMADol (ULTRAM) tablet 50 mg (not administered)  darifenacin (ENABLEX) 24 hr tablet 15 mg (not administered)  enoxaparin (LOVENOX) injection 40 mg (not administered)  budesonide (PULMICORT) nebulizer solution 0.25 mg (not administered)  potassium chloride SA (K-DUR,KLOR-CON) CR tablet 20 mEq (not administered)  albuterol (PROVENTIL,VENTOLIN) solution continuous neb (15 mg/hr Nebulization Given 12/17/16 1736)  ipratropium (ATROVENT) nebulizer solution 1 mg (1 mg Nebulization Given 12/17/16 1736)  methylPREDNISolone sodium succinate (SOLU-MEDROL) 125 mg/2 mL injection 125 mg (125 mg Intravenous Given 12/17/16 1807)  ibuprofen  (ADVIL,MOTRIN) tablet 600 mg (600 mg Oral Given 12/17/16 1842)     Initial Impression / Assessment and Plan / ED Course  I have reviewed the triage vital signs and the nursing notes.  Pertinent labs & imaging results that were available during my care of the patient were reviewed by me and considered in my medical decision making (see chart for details).  46 y.o. female PMH asthma, CHF presents for increased dyspnea and chest pain. AF, VSS, sating well on RA. Diminished breath sounds with diffuse expiratory wheezing. Likely due to asthma, doubt CHF exacerbation. Atypical chest pain, EKG NSR, delta troponin negative. CXR w/o consolidation or pulmonary edema. Lungs sounds improved with continuous neb, however pt does not feel better. During short ambulation to restroom, pt became severely diaphoretic and dyspneic. Care transferred to Dr Alcario Drought for admission.    Final Clinical Impressions(s) / ED Diagnoses   Final diagnoses:  Shortness of breath    New Prescriptions New Prescriptions   No medications on file     Monico Blitz, MD 12/17/16 2314    Merrily Pew, MD 12/18/16 [phone]

## 2016-12-18 DIAGNOSIS — R7303 Prediabetes: Secondary | ICD-10-CM | POA: Diagnosis not present

## 2016-12-18 DIAGNOSIS — I10 Essential (primary) hypertension: Secondary | ICD-10-CM

## 2016-12-18 DIAGNOSIS — I5032 Chronic diastolic (congestive) heart failure: Secondary | ICD-10-CM

## 2016-12-18 DIAGNOSIS — J4531 Mild persistent asthma with (acute) exacerbation: Secondary | ICD-10-CM

## 2016-12-18 LAB — BASIC METABOLIC PANEL
Anion gap: 13 (ref 5–15)
BUN: 10 mg/dL (ref 6–20)
CO2: 21 mmol/L — ABNORMAL LOW (ref 22–32)
Calcium: 9 mg/dL (ref 8.9–10.3)
Chloride: 104 mmol/L (ref 101–111)
Creatinine, Ser: 0.7 mg/dL (ref 0.44–1.00)
GFR calc Af Amer: >60 mL/min (ref >60)
GFR calc non Af Amer: >60 mL/min (ref >60)
Glucose, Bld: 146 mg/dL — ABNORMAL HIGH (ref 65–99)
Potassium: 4.1 mmol/L (ref 3.5–5.1)
Sodium: 138 mmol/L (ref 135–145)

## 2016-12-18 LAB — HIV ANTIBODY (ROUTINE TESTING W REFLEX): HIV Screen 4th Generation wRfx: NONREACTIVE

## 2016-12-18 MED ORDER — WHITE PETROLATUM GEL
Status: AC
  Administered 2016-12-18: 11:00:00
  Filled 2016-12-18: qty 1

## 2016-12-18 MED ORDER — PREDNISONE 20 MG PO TABS: 40.0000 mg | ORAL_TABLET | Freq: Every day | ORAL | 0 refills | Status: DC

## 2016-12-18 MED ORDER — POTASSIUM CHLORIDE ER 20 MEQ PO TBCR: 20.0000 meq | EXTENDED_RELEASE_TABLET | Freq: Two times a day (BID) | ORAL | 0 refills | Status: DC

## 2016-12-18 NOTE — Discharge Summary (Signed)
Physician Discharge Summary  Lori Bean ION:[id number] DOB: [birth year] DOA: 12/17/2016  PCP: Benito Mccreedy, MD  Admit date: 12/17/2016 Discharge date: 12/18/2016  Time spent: 45 minutes  Recommendations for Outpatient Follow-up:  Patient will be discharged to home.  Patient will need to follow up with primary care provider within one week of discharge.  Follow up with pulmonology for scheduled appointment. Patient should continue medications as prescribed.  Patient should follow a heart healthy/carb modified diet.   Discharge Diagnoses:  Possible asthma exacerbation Essential hypertension Chronic diastolic heart failure Prediabetes Morbid obesity  Discharge Condition: Stable  Diet recommendation: Heart healthy/carb modified  Filed Weights   12/17/16 1533 12/17/16 2357  Weight: 116.6 kg (257 lb) 116.2 kg (256 lb 2.8 oz)    History of present illness:  On 12/17/2016 by Dr. Cletis Media is a 46 y.o. female with medical history significant of Asthma and OSA (reversibility demonstrated on PFTs in Feb this year), smoking, HTN, CHF grade 2 diastolic dysfunction.  Patient presents to the ED with c/o SOB.  Symptoms onset at rest yesterday, also has 10lb weight gain over past 3 weeks but states she is compliant with lasix 80 BID.  2 albuterol nebs before seeing PCP this AM.  PCP sent her to ED for evaluation.  Hospital Course:  Possible asthma exacerbation -Patient states that her wheezing has actually improved and she is feeling better today -She recently finished a course of prednisone back in February 2018 -She has been following up with the pulmonary clinic -Chest x-ray -Spoke with Dr. [name], patient's pulmonologist. Of note, progress note from Dr. [name] back in January 2018 stated that he was not convinced of her asthma. At this time, Dr. [name] recommended no change in patient's medications. Follow-up as an outpatient. -Continue Advair Diskus, pro-air,  QVAR -Patient was placed on prednisone 40 mg, will continue this for 4 days.  Essential hypertension -Continue amlodipine -Lasix was discontinued as she does complain of chronic cough  Chronic diastolic heart failure -Echocardiogram 05/24/2015 showed EF of 2440%, grade 2 diastolic dysfunction -Currently appears euvolemic and compensated on exam. -BNP 24.8 -Continue home Lasix dose with potassium supplementation  Prediabetes -Hemoglobin A1c was 6.1 back in 2016. This can be followed and managed as an outpatient by patient's primary care physician.  Morbid obesity -BMI 47.1 -Patient only talk to her primary care physician regarding weight loss and lifestyle modifications. -Patient states she feels that she has gained 10-15 pounds in the past several weeks. States she has been watching what she eats. Admits that she has been using different scales at different doctors offices. Inquires about whether there is a program that offers free scales. Case management stated that this program is no longer available.   Procedures:  None  Consultations:  Dr. [name], pulmonology, via phone  Discharge Exam: Vitals:   12/18/16 0518 12/18/16 0904  BP: (!) 163/74 (!) 162/89  Pulse: 89   Resp: 18   Temp: 97.9 F (36.6 C)      General: Well developed, well nourished, NAD, appears stated age  HEENT: NCAT, PERRLA, EOMI, Anicteic Sclera, mucous membranes moist.  Neck: Supple, no JVD, no masses  Cardiovascular: S1 S2 auscultated, no rubs, murmurs or gallops. Regular rate and rhythm.  Respiratory: Clear to auscultation bilaterally with equal chest rise  Abdomen: Soft, nontender, nondistended, + bowel sounds  Extremities: warm dry without cyanosis clubbing or edema  Neuro: AAOx3, cranial nerves grossly intact. Strength 5/5 in patient's upper and lower extremities bilaterally  Skin: Without rashes exudates or nodules  Psych: Normal affect and demeanor with intact judgement and  insight  Discharge Instructions Discharge Instructions    Discharge instructions    Complete by:  As directed    Patient will be discharged to home.  Patient will need to follow up with primary care provider within one week of discharge.  Follow up with pulmonology for scheduled appointment. Patient should continue medications as prescribed.  Patient should follow a heart healthy/carb modified diet.     Current Discharge Medication List    START taking these medications   Details  predniSONE (DELTASONE) 20 MG tablet Take 2 tablets (40 mg total) by mouth daily with breakfast. Qty: 8 tablet, Refills: 0      CONTINUE these medications which have CHANGED   Details  Potassium Chloride ER 20 MEQ TBCR Take 20 mEq by mouth 2 (two) times daily. Qty: 60 tablet, Refills: 0      CONTINUE these medications which have NOT CHANGED   Details  ADVAIR DISKUS 250-50 MCG/DOSE AEPB Inhale 1 puff into the lungs 2 (two) times daily. Refills: 0    albuterol (PROAIR HFA) 108 (90 Base) MCG/ACT inhaler INHALE TWO PUFFS INTO THE LUNGS EVERY SIX HOURS AS NEEDED FOR WHEEZING OR SHORTNESS OF BREATH Qty: 8.5 g, Refills: 3    amLODipine (NORVASC) 10 MG tablet Take 1 tablet (10 mg total) by mouth daily. Qty: 180 tablet, Refills: 3    ARIPiprazole (ABILIFY) 20 MG tablet Take 1 tablet (20 mg total) by mouth at bedtime. Qty: 30 tablet, Refills: 0   Associated Diagnoses: Bipolar affective disorder, most recent episode unspecified type, remission status unspecified    Beclomethasone Diprop HFA (QVAR REDIHALER) 80 MCG/ACT AERB Inhale 2 puffs into the lungs 2 (two) times daily. Qty: 1 Inhaler, Refills: 3    furosemide (LASIX) 80 MG tablet Take 80 mg by mouth 2 (two) times daily.    pregabalin (LYRICA) 150 MG capsule Take 150 mg by mouth daily.    traMADol (ULTRAM) 50 MG tablet Take 50 mg by mouth 4 (four) times daily as needed for pain. Refills: 0    triamcinolone cream (KENALOG) 0.1 % Apply 1 application  topically 2 (two) times daily as needed (foot rash). Qty: 30 g, Refills: 2   Associated Diagnoses: Dermatitis    VESICARE 10 MG tablet Take 10 mg by mouth daily. Refills: 0       Allergies  Allergen Reactions  . Aspirin Nausea And Vomiting   Follow-up Information    OSEI-BONSU,GEORGE, MD. Schedule an appointment as soon as possible for a visit in 1 week(s).   Specialty:  Internal Medicine Why:  Hospital follow-up Contact information: [address] [place] [address] [phone]        Rigoberto Noel., MD. Schedule an appointment as soon as possible for a visit in 3 week(s).   Specialty:  Pulmonary Disease Contact information: [address] [address] [phone]            The results of significant diagnostics from this hospitalization (including imaging, microbiology, ancillary and laboratory) are listed below for reference.    Significant Diagnostic Studies: Dg Chest 2 View  Result Date: 12/17/2016 CLINICAL DATA:  Shortness of breath and 10 pound weight gain over the past 2 weeks. EXAM: CHEST  2 VIEW COMPARISON:  PA and lateral chest 10/22/2016 and 12/23/2015. CT chest 08/17/2015. FINDINGS: The lungs are clear. Heart size is normal. No pneumothorax or pleural  effusion. No acute bony abnormality. IMPRESSION: No acute disease. Electronically Signed   By: Inge Rise M.D.   On: 12/17/2016 16:00    Microbiology: No results found for this or any previous visit (from the past 240 hour(s)).   Labs: Basic Metabolic Panel:  Recent Labs Lab 12/17/16 1534 12/18/16 0808  NA 139 138  K 4.1 4.1  CL 106 104  CO2 26 21*  GLUCOSE 105* 146*  BUN 11 10  CREATININE 0.76 0.70  CALCIUM 8.9 9.0   Liver Function Tests: No results for input(s): AST, ALT, ALKPHOS, BILITOT, PROT, ALBUMIN in the last 168 hours. No results for input(s): LIPASE, AMYLASE in the last 168 hours. No results for input(s): AMMONIA in the last 168  hours. CBC:  Recent Labs Lab 12/17/16 1534  WBC 11.6*  HGB 12.0  HCT 36.8  MCV 94.4  PLT 274   Cardiac Enzymes:  Recent Labs Lab 12/17/16 1758  TROPONINI <0.03   BNP: BNP (last 3 results)  Recent Labs  09/18/16 1058 10/22/16 2234 12/17/16 1758  BNP 15.2 13.2 24.8    ProBNP (last 3 results) No results for input(s): PROBNP in the last 8760 hours.  CBG: No results for input(s): GLUCAP in the last 168 hours.     SignedCristal Ford  [facility] 12/18/2016, 12:03 PM

## 2016-12-18 NOTE — Care Management Note (Signed)
Case Management Note  Patient Details  Name: DEARI SESSLER MRN: [id number] Date of Birth: 07-01-1971  Subjective/Objective:                  Patient with order to DC to home. NO CM needs, consults, orders identified at this time.    Action/Plan:  DC to home self care.   Expected Discharge Date:  12/18/16               Expected Discharge Plan:  Home/Self Care  In-House Referral:     Discharge planning Services  CM Consult  Post Acute Care Choice:    Choice offered to:     DME Arranged:    DME Agency:     HH Arranged:    HH Agency:     Status of Service:  Completed, signed off  If discussed at [employer] of Stay Meetings, dates discussed:    Additional Comments:  Carles Collet, RN 12/18/2016, 12:40 PM

## 2016-12-18 NOTE — Progress Notes (Signed)
SATURATION QUALIFICATIONS: (This note is used to comply with regulatory documentation for home oxygen)  Patient Saturations on Room Air at Rest = 99%  Patient Saturations on Room Air while Ambulating = 97%  Patient Saturations on 0 Liters of oxygen while Ambulating = Did not walk with O2.  Please briefly explain why patient needs home oxygen: Not indicated.

## 2016-12-18 NOTE — Progress Notes (Signed)
Lori Bean to be D/C'd Home per MD order.  Discussed with the patient and all questions fully answered.  VSS, Skin clean, dry and intact without evidence of skin break down, no evidence of skin tears noted. IV catheter discontinued intact. Site without signs and symptoms of complications. Dressing and pressure applied.  An After Visit Summary was printed and given to the patient. Patient received prescription.  D/c education completed with patient/family including follow up instructions, medication list, d/c activities limitations if indicated, with other d/c instructions as indicated by MD - patient able to verbalize understanding, all questions fully answered.   Patient instructed to return to ED, call 911, or call MD for any changes in condition.   Patient escorted via [place], and D/C home via private auto.  Christoper Fabian Baylin Cabal 12/18/2016 1:36 PM

## 2016-12-18 NOTE — Progress Notes (Signed)
Pt arrived on unit in no acute distress.  Placed on telemetry, assessment and vital sign completed.  In bed resting.  Will continue to monitor.

## 2016-12-18 NOTE — Discharge Instructions (Signed)

## 2016-12-20 NOTE — Progress Notes (Deleted)
Cardiology Office Note   Date:  12/20/2016   ID:  Lori Bean, DOB 20-Sep-1970, MRN [id number]  PCP:  Benito Mccreedy, MD  Cardiologist:   [name], MD   No chief complaint on file.     Patient ID: Lori Bean is a 45 y.o. female with chronic diastolic heart failure, hypertensive heart disease, OSA, schizophrenia, bipolar disorder, obesity and prior PE who presents for follow up.  Lori Bean was initially seen 05/2015 with fatigue and shortness of breath.  At the time she endorsed heart failure symptoms, including lower extremity edema, orthopnea and shortness of breath.  She also reported atypical chest pain.  After that appointment she was referred to the hospital and had a [place] 05/26/15 that was negative for ischemia and revealed LVEF 57%.  She had an echo 05/24/15 with LVEF 60-65% and grade 2 diastolic dysfunction.  She was switched from lasix to bumex with improvement in her edema.  On CT scan Lori Bean was incidentally noted to have adrenal hyperplasia. Her blood pressure has been well-controlled on one antihypertensive. She has intermittently had mild hypokalemia.   At her last appointment Lori Bean reported shortness of breath and was treated for an asthma/COPD exacerbation.  BNP was not elevated.  Since then she was admitted to the hospital 12/2016 for a possible asthma exacerbation.  She was euvolemic at that time.  Her blood pressure was poorly controlled during that hospitalization.     ? CPAP mask fitting  Past Medical History:  Diagnosis Date  . Anginal pain ([place])    occ from asthma  . Anxiety   . Arthritis   . Asthma   . Bipolar affective disorder ([place])    Schizophrenia  . Bronchitis    hx of  . CHF (congestive heart failure) ([place])   . Depression   . GERD (gastroesophageal reflux disease)   . Heart murmur   . Hypertension   . Neuropathy ([facility])    left leg , "from back surgery"  . Overactive bladder   .  Schizophrenia ([place])   . Sciatica   . Shortness of breath   . Snoring disorder    Pt stated my boyfriend always wakes me up and tells me to breathe    Past Surgical History:  Procedure Laterality Date  . BACK SURGERY     3 back surgeries  . CHOLECYSTECTOMY    . COLONOSCOPY WITH PROPOFOL N/A 01/23/2016   Procedure: COLONOSCOPY WITH PROPOFOL;  Surgeon: Wonda Horner, MD;  Location: [facility];  Service: Endoscopy;  Laterality: N/A;  . EYE SURGERY     Metal plate in right eye. Had fracture in right eye  . gallstones reomved    . KNEE ARTHROSCOPY WITH MENISCAL REPAIR Right 09/28/2016   Procedure: KNEE ARTHROSCOPY WITH MENISCAL REPAIR;  Surgeon: Dorna Leitz, MD;  Location: [facility];  Service: Orthopedics;  Laterality: Right;  Right partial meniscectomy and chondroplasty, patellar/femoral joint and medial femoral condyle   . LUMBAR LAMINECTOMY/DECOMPRESSION MICRODISCECTOMY  04/01/2012   Procedure: LUMBAR LAMINECTOMY/DECOMPRESSION MICRODISCECTOMY 2 LEVELS;  Surgeon: Faythe Ghee, MD;  Location: [facility];  Service: Neurosurgery;  Laterality: Left;  Lumbar four-five, lumbar five sacral one microdiscectomy   . LUMBAR WOUND DEBRIDEMENT  04/29/2012   Procedure: LUMBAR WOUND DEBRIDEMENT;  Surgeon: Faythe Ghee, MD;  Location: [facility];  Service: Neurosurgery;  Laterality: N/A;  lumbar wound debridement  . ROTATOR CUFF REPAIR     Right shoulder  . TUBAL  LIGATION       Current Outpatient Prescriptions  Medication Sig Dispense Refill  . ADVAIR DISKUS 250-50 MCG/DOSE AEPB Inhale 1 puff into the lungs 2 (two) times daily.  0  . albuterol (PROAIR HFA) 108 (90 Base) MCG/ACT inhaler INHALE TWO PUFFS INTO THE LUNGS EVERY SIX HOURS AS NEEDED FOR WHEEZING OR SHORTNESS OF BREATH (Patient taking differently: Inhale 2 puffs into the lungs every 6 (six) hours as needed for wheezing or shortness of breath. ) 8.5 g 3  . amLODipine (NORVASC) 10 MG tablet Take 1 tablet (10 mg total) by mouth daily. 180  tablet 3  . ARIPiprazole (ABILIFY) 20 MG tablet Take 1 tablet (20 mg total) by mouth at bedtime. (Patient taking differently: Take 20 mg by mouth 2 (two) times daily. ) 30 tablet 0  . Beclomethasone Diprop HFA (QVAR REDIHALER) 80 MCG/ACT AERB Inhale 2 puffs into the lungs 2 (two) times daily. 1 Inhaler 3  . furosemide (LASIX) 80 MG tablet Take 80 mg by mouth 2 (two) times daily.    . Potassium Chloride ER 20 MEQ TBCR Take 20 mEq by mouth 2 (two) times daily. 60 tablet 0  . predniSONE (DELTASONE) 20 MG tablet Take 2 tablets (40 mg total) by mouth daily with breakfast. 8 tablet 0  . pregabalin (LYRICA) 150 MG capsule Take 150 mg by mouth daily.    . traMADol (ULTRAM) 50 MG tablet Take 50 mg by mouth 4 (four) times daily as needed for pain.  0  . triamcinolone cream (KENALOG) 0.1 % Apply 1 application topically 2 (two) times daily as needed (foot rash). 30 g 2  . VESICARE 10 MG tablet Take 10 mg by mouth daily.  0   No current facility-administered medications for this visit.     Allergies:   Aspirin    Social History:  The patient  reports that she has been smoking Cigarettes.  She has a 12.00 pack-year smoking history. She has never used smokeless tobacco. She reports that she does not drink alcohol or use drugs.   Family History:  The patient's family history includes Cancer in her paternal aunt; Diabetes in her father and mother; Heart disease in her paternal aunt; Hypertension in her mother.    ROS:  Please see the history of present illness.   Otherwise, review of systems are positive for none.   All other systems are reviewed and negative.    PHYSICAL EXAM: VS:  There were no vitals taken for this visit. , BMI There is no height or weight on file to calculate BMI. GENERAL:  Well appearing HEENT:  Pupils equal round and reactive, fundi not visualized, oral mucosa unremarkable NECK:  No jugular venous distention, waveform within normal limits, carotid upstroke brisk and symmetric, no  bruits LYMPHATICS:  No cervical adenopathy LUNGS:  Diffuse, mild expiratory wheezes,  No rhonchi or crackles.   Diminished air movement.  HEART:  RRR.  PMI not displaced or sustained,S1 and S2 within normal limits, no S3, no S4, no clicks, no rubs, no murmurs ABD:  Flat, positive bowel sounds normal in frequency in pitch, no bruits, no rebound, no guarding, no midline pulsatile mass, no hepatomegaly, no splenomegaly EXT:  2 plus pulses throughout, no edema,  no cyanosis no clubbing SKIN:  No rashes no nodules NEURO:  Cranial nerves II through XII grossly intact, motor grossly intact throughout PSYCH:  Cognitively intact, oriented to person place and time  EKG:  EKG is ordered today. The ekg ordered  09/18/16 demonstrates sinus rhythm. Rate 77 bpm.  Non-specific T wave changes.    Echo 05/24/15: Study Conclusions  - Left ventricle: The cavity size was normal. Wall thickness was  increased in a pattern of mild LVH. Systolic function was normal.  The estimated ejection fraction was in the range of 60% to 65%.  Wall motion was normal; there were no regional wall motion  abnormalities. Features are consistent with a pseudonormal left  ventricular filling pattern, with concomitant abnormal relaxation  and increased filling pressure (grade 2 diastolic dysfunction).  Recent Labs: 06/24/2016: ALT 21 12/17/2016: B Natriuretic Peptide 24.8; Hemoglobin 12.0; Platelets 274 12/18/2016: BUN 10; Creatinine, Ser 0.70; Potassium 4.1; Sodium 138    Lipid Panel    Component Value Date/Time   CHOL 133 05/25/2015 0333   TRIG 105 05/25/2015 0333   HDL 50 05/25/2015 0333   CHOLHDL 2.7 05/25/2015 0333   VLDL 21 05/25/2015 0333   LDLCALC 62 05/25/2015 0333      Wt Readings from Last 3 Encounters:  12/17/16 116.2 kg (256 lb 2.8 oz)  11/10/16 111.1 kg (245 lb)  10/09/16 109 kg (240 lb 3.2 oz)     Other studies Reviewed: Additional studies/ records that were reviewed today include: . Review of the  above records demonstrates:  Please see elsewhere in the note.     ASSESSMENT AND PLAN:  # Hypertensive heart disease:  Blood pressure is above goal.  Continue amlodipine and start lisinopril 20mg  daily.  We will check a BMP today.  # Chronic diastolic heart failure:  # Obesity: # Shortness of breath: # Asthma/COPD exacerbation: Ms. Setterlund is euvolemic on exam today. I suspect that her shortness of breath is more related to asthma/COPD exacerbation than heart failure.  She does not seem to be in acute heart failure.  However, we will check a BNP to better assess.  It seems that she has a COPD/asthma exacerbation. We will give her Azithromycin 500mg  x1 followed by 250mg  daily x4 days.  Prednisone 40mg  daily x4 days.  I encouraged her to continue using her albuterol inhaler.  She will go for a CXR as well.  We also discussed the importance of limiting salt intake.  She did not realize that seasoning salt has salt in it.    # Chest pain: Likely musculoskeletal. Her chest pain is reproducible with palpation and she had a negative stress test.  # Tobacco abuse: Encouraged smoking cessation.  She has cut back to 1/2 ppd.  # Morbid Obesity: We discussed her weight gain and the importance of limiting calories and increasing her physical exercise.  Current medicines are reviewed at length with the patient today.  The patient does not have concerns regarding medicines.  The following changes have been made:  Start lisinopril 20mg  daily.   Labs/ tests ordered today include:    No orders of the defined types were placed in this encounter.    Disposition:  FU with Dr. Jonelle Sidle C. [place] in 3 months.  APP in 3 weeks.    Signed, [name], MD  12/20/2016 10:48 AM    [facility]

## 2016-12-21 ENCOUNTER — Ambulatory Visit: Payer: Medicaid Other | Admitting: Cardiovascular Disease

## 2016-12-22 ENCOUNTER — Encounter: Payer: Self-pay | Admitting: *Deleted

## 2016-12-22 IMAGING — CR DG CHEST 2V
2 series · 2 of 2 positions shown · non-contrast
Comparison: 10/27/2014

CLINICAL DATA: Constipated for 3 days. chest pain and shortness of
breath. Asthma and hypertension.

EXAM:
CHEST  2 VIEW

[chest pa]
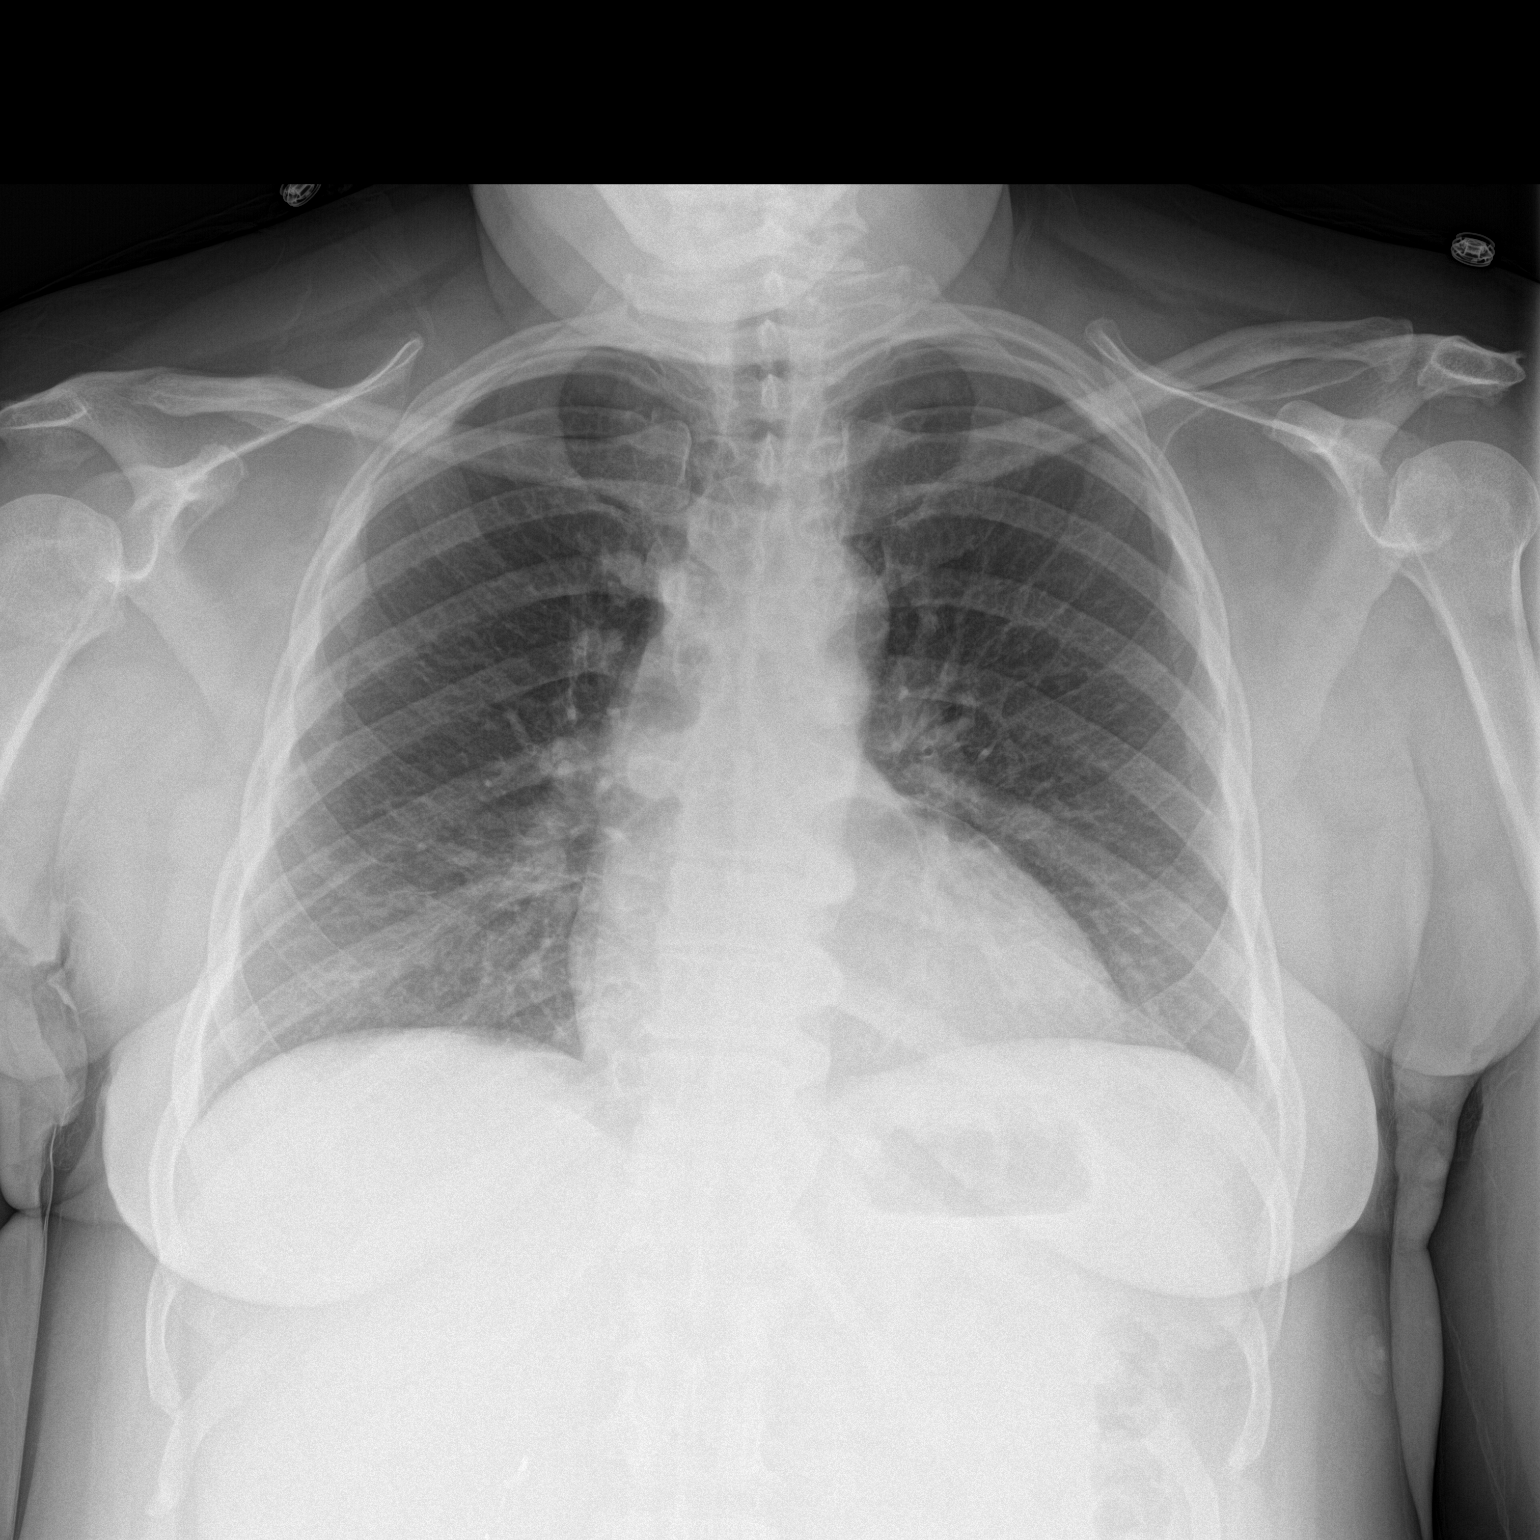

[chest lat]
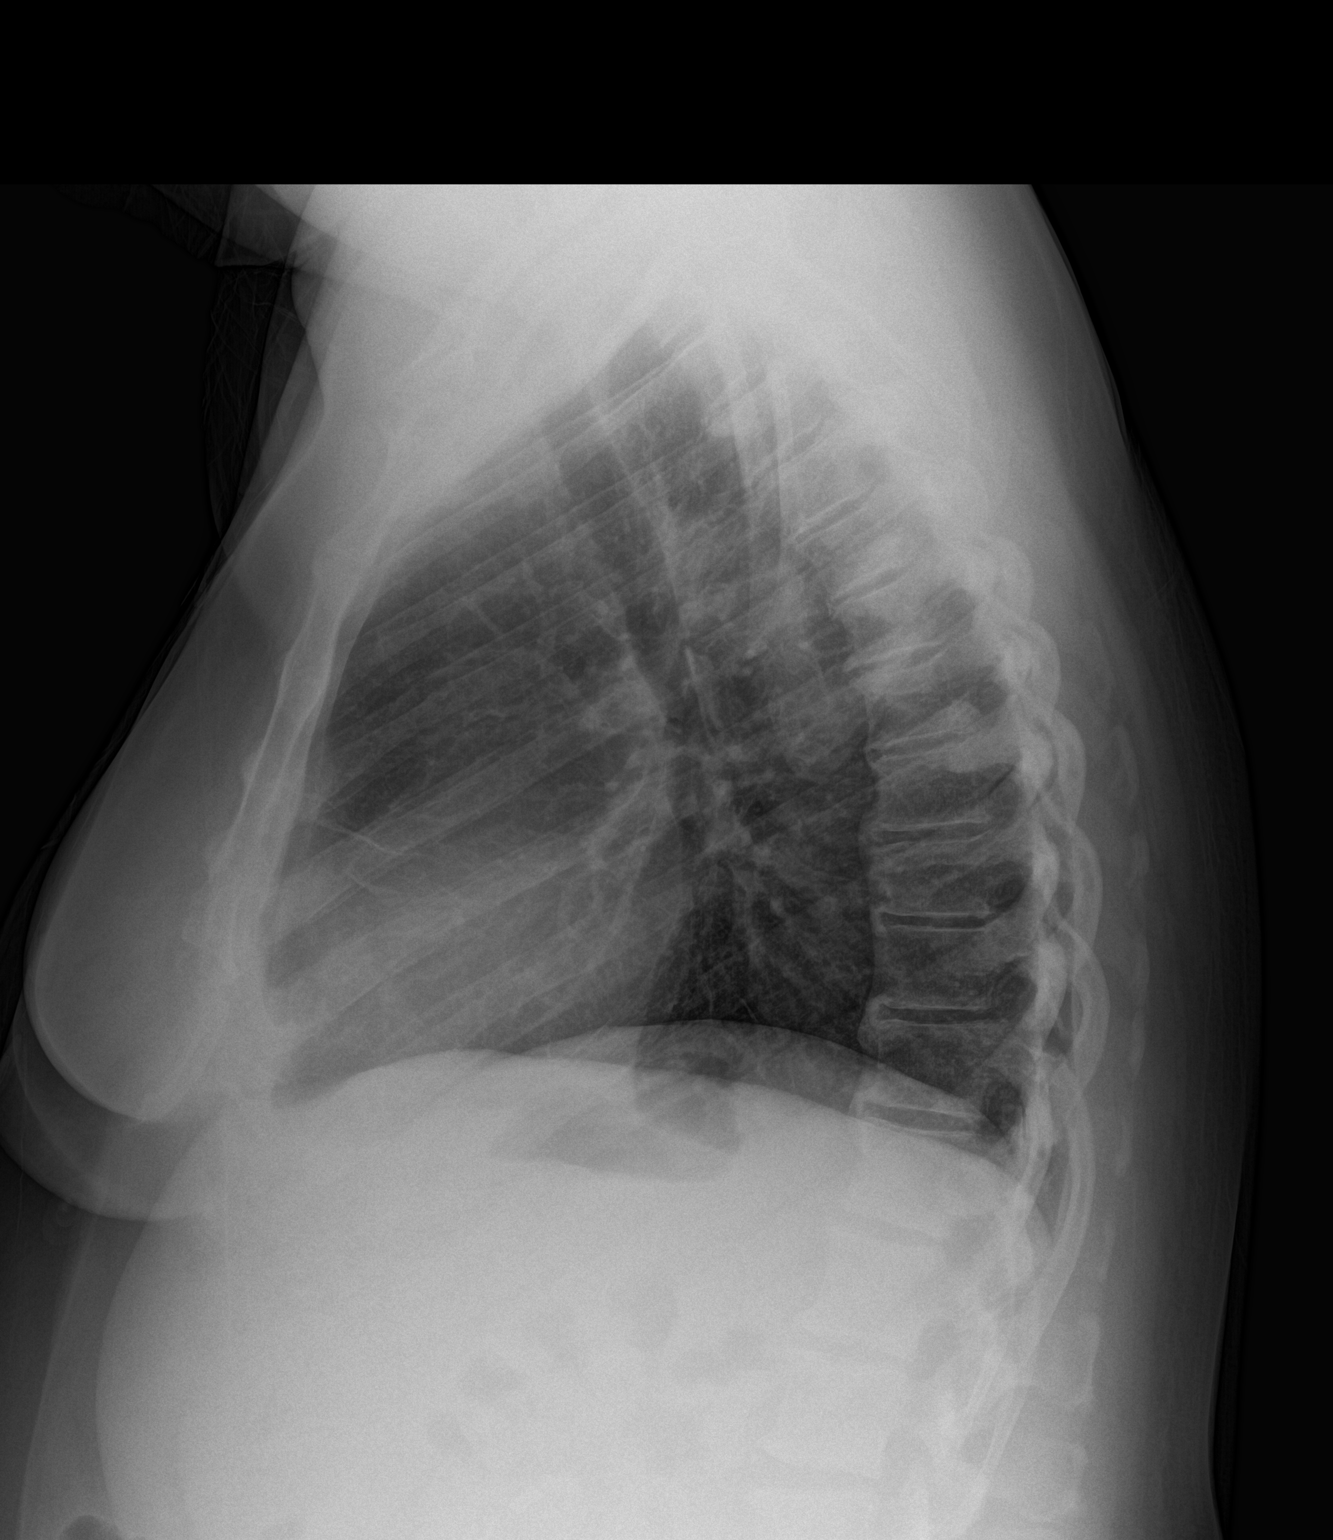

[2 of 2 positions shown; findings below may reference images not displayed]

FINDINGS: Moderate mid and lower thoracic spondylosis. Midline trachea. Normal
heart size and mediastinal contours. No pleural effusion or
pneumothorax. Clear lungs.
IMPRESSION: No acute cardiopulmonary disease.

## 2017-02-19 NOTE — Addendum Note (Signed)
Addendum  created 02/19/17 0920 by Lyn Hollingshead, MD   Sign clinical note

## 2017-03-14 NOTE — Progress Notes (Signed)
Cardiology Office Note   Date:  03/15/2017   ID:  ALEXIA DINGER, DOB Feb 27, 1971, MRN [id number]  PCP:  [name], PA  Cardiologist:   [name], MD   No chief complaint on file.     Patient ID: Lori Bean is a 46 y.o. female with chronic diastolic heart failure, hypertensive heart disease, OSA, schizophrenia, bipolar disorder, obesity and prior PE who presents for follow up.  Lori Bean was initially seen 05/2015 with fatigue and shortness of breath.  At the time she endorsed heart failure symptoms, including lower extremity edema, orthopnea and shortness of breath.  She also reported atypical chest pain.  After that appointment she was referred to the hospital and had a [place] 05/26/15 that was negative for ischemia and revealed LVEF 57%.  She had an echo 05/24/15 with LVEF 60-65% and grade 2 diastolic dysfunction.  She was switched from lasix to bumex with improvement in her edema.  On CT scan Lori Bean was incidentally noted to have adrenal hyperplasia. Her blood pressure has been well-controlled on one antihypertensive. She has intermittently had mild hypokalemia.   Since her last appointment Lori Bean's BP was elevated so lisinopril was added to her regimen.  She was also treated for an asthma/COPD exacerbation.  Since that appointment she saw her PCP and was started on HCTZ/triamterene.  She reports cramps in her abdomen and lower extremities.  She denies chest pain or shortness of breath other than when she coughs.  Lisinopril was discontinued 2/2 cough, which has helped. Lori Bean hasn't been exercising much lately.     Past Medical History:  Diagnosis Date  . Anginal pain ([place])    occ from asthma  . Anxiety   . Arthritis   . Asthma   . Bipolar affective disorder ([place])    Schizophrenia  . Bronchitis    hx of  . CHF (congestive heart failure) ([place])   . Depression   . GERD (gastroesophageal reflux disease)   . Heart  murmur   . Hypertension   . Neuropathy    left leg , "from back surgery"  . Overactive bladder   . Schizophrenia ([place])   . Sciatica   . Shortness of breath   . Snoring disorder    Pt stated my boyfriend always wakes me up and tells me to breathe    Past Surgical History:  Procedure Laterality Date  . BACK SURGERY     3 back surgeries  . CHOLECYSTECTOMY    . COLONOSCOPY WITH PROPOFOL N/A 01/23/2016   Procedure: COLONOSCOPY WITH PROPOFOL;  Surgeon: Wonda Horner, MD;  Location: [facility];  Service: Endoscopy;  Laterality: N/A;  . EYE SURGERY     Metal plate in right eye. Had fracture in right eye  . gallstones reomved    . KNEE ARTHROSCOPY WITH MENISCAL REPAIR Right 09/28/2016   Procedure: KNEE ARTHROSCOPY WITH MENISCAL REPAIR;  Surgeon: Dorna Leitz, MD;  Location: [facility];  Service: Orthopedics;  Laterality: Right;  Right partial meniscectomy and chondroplasty, patellar/femoral joint and medial femoral condyle   . LUMBAR LAMINECTOMY/DECOMPRESSION MICRODISCECTOMY  04/01/2012   Procedure: LUMBAR LAMINECTOMY/DECOMPRESSION MICRODISCECTOMY 2 LEVELS;  Surgeon: Faythe Ghee, MD;  Location: [facility];  Service: Neurosurgery;  Laterality: Left;  Lumbar four-five, lumbar five sacral one microdiscectomy   . LUMBAR WOUND DEBRIDEMENT  04/29/2012   Procedure: LUMBAR WOUND DEBRIDEMENT;  Surgeon: Faythe Ghee, MD;  Location: [facility];  Service: Neurosurgery;  Laterality: N/A;  lumbar wound debridement  . ROTATOR CUFF REPAIR     Right shoulder  . TUBAL LIGATION       Current Outpatient Prescriptions  Medication Sig Dispense Refill  . ADVAIR DISKUS 250-50 MCG/DOSE AEPB Inhale 1 puff into the lungs 2 (two) times daily.  0  . albuterol (PROAIR HFA) 108 (90 Base) MCG/ACT inhaler INHALE TWO PUFFS INTO THE LUNGS EVERY SIX HOURS AS NEEDED FOR WHEEZING OR SHORTNESS OF BREATH (Patient taking differently: Inhale 2 puffs into the lungs every 6 (six) hours as needed for wheezing or shortness of  breath. ) 8.5 g 3  . ARIPiprazole (ABILIFY) 20 MG tablet Take 1 tablet (20 mg total) by mouth at bedtime. (Patient taking differently: Take 20 mg by mouth 2 (two) times daily. ) 30 tablet 0  . Beclomethasone Diprop HFA (QVAR REDIHALER) 80 MCG/ACT AERB Inhale 2 puffs into the lungs 2 (two) times daily. 1 Inhaler 3  . cephALEXin (KEFLEX) 500 MG capsule Take 1 capsule by mouth 2 (two) times daily. For 10 days  0  . DULoxetine (CYMBALTA) 30 MG capsule Take 1 capsule by mouth 2 (two) times daily.  3  . fluticasone (CUTIVATE) 0.05 % cream Apply 1 application topically 2 (two) times daily. For 14 days  0  . furosemide (LASIX) 80 MG tablet Take 80 mg by mouth 2 (two) times daily.    [name] NICOTINE STEP 3 7 MG/24HR patch Apply 1 patch topically daily.  0  . omeprazole (PRILOSEC) 20 MG capsule Take 1 capsule by mouth daily.  3  . Potassium Chloride ER 20 MEQ TBCR Take 20 mEq by mouth 2 (two) times daily. 60 tablet 0  . predniSONE (DELTASONE) 20 MG tablet Take 2 tablets (40 mg total) by mouth daily with breakfast. 8 tablet 0  . pregabalin (LYRICA) 150 MG capsule Take 150 mg by mouth daily.    . SYMBICORT 160-4.5 MCG/ACT inhaler Inhale 2 puffs into the lungs 2 (two) times daily.  3  . tiZANidine (ZANAFLEX) 4 MG tablet Take 1 tablet by mouth every 8 (eight) hours as needed.  1  . traMADol (ULTRAM) 50 MG tablet Take 50 mg by mouth 4 (four) times daily as needed for pain.  0  . triamcinolone cream (KENALOG) 0.1 % Apply 1 application topically 2 (two) times daily as needed (foot rash). 30 g 2  . VESICARE 10 MG tablet Take 10 mg by mouth daily.  0  . DIOVAN 160 MG tablet Take 1 tablet (160 mg total) by mouth daily. 30 tablet 5   No current facility-administered medications for this visit.     Allergies:   Aspirin    Social History:  The patient  reports that she has been smoking Cigarettes.  She has a 12.00 pack-year smoking history. She has never used smokeless tobacco. She reports that she does not drink  alcohol or use drugs.   Family History:  The patient's family history includes Cancer in her paternal aunt; Diabetes in her father and mother; Heart disease in her paternal aunt; Hypertension in her mother.    ROS:  Please see the history of present illness.   Otherwise, review of systems are positive for none.   All other systems are reviewed and negative.    PHYSICAL EXAM: VS:  BP 110/78   Pulse 96   Ht 5\' 2"  (1.575 m)   Wt 113 kg (249 lb 3.2 oz)   BMI 45.58 kg/m  , BMI Body mass  index is 45.58 kg/m. GENERAL:  Well appearing.  No acute distress. HEENT:  Pupils equal round and reactive, fundi not visualized, oral mucosa unremarkable NECK:  No jugular venous distention, waveform within normal limits, carotid upstroke brisk and symmetric, no bruits LUNGS:  Diffuse, mild expiratory wheezes,  No rhonchi or crackles.   Diminished air movement.  HEART:  RRR.  PMI not displaced or sustained,S1 and S2 within normal limits, no S3, no S4, no clicks, no rubs, no murmurs  ABD:  Flat, positive bowel sounds normal in frequency in pitch, no bruits, no rebound, no guarding, no midline pulsatile mass, no hepatomegaly, no splenomegaly EXT:  2 plus pulses throughout, no edema,  no cyanosis no clubbing SKIN:  No rashes no nodules NEURO:  Cranial nerves II through XII grossly intact, motor grossly intact throughout PSYCH:  Cognitively intact, oriented to person place and time.  Flat affet  EKG:  EKG is not ordered today. The ekg ordered 09/18/16 demonstrates sinus rhythm. Rate 77 bpm.  Non-specific T wave changes.    Echo 05/24/15: Study Conclusions  - Left ventricle: The cavity size was normal. Wall thickness was  increased in a pattern of mild LVH. Systolic function was normal.  The estimated ejection fraction was in the range of 60% to 65%.  Wall motion was normal; there were no regional wall motion  abnormalities. Features are consistent with a pseudonormal left  ventricular filling pattern,  with concomitant abnormal relaxation  and increased filling pressure (grade 2 diastolic dysfunction).  Lexiscan Myoview 05/26/15: IMPRESSION: 1. No reversible ischemia or infarction.  2. Mild septal hypokinesis.  3. Left ventricular ejection fraction 57%  4. Low-risk stress test findings*.   Recent Labs: 06/24/2016: ALT 21 12/17/2016: B Natriuretic Peptide 24.8; Hemoglobin 12.0; Platelets 274 12/18/2016: BUN 10; Creatinine, Ser 0.70; Potassium 4.1; Sodium 138    Lipid Panel    Component Value Date/Time   CHOL 133 05/25/2015 0333   TRIG 105 05/25/2015 0333   HDL 50 05/25/2015 0333   CHOLHDL 2.7 05/25/2015 0333   VLDL 21 05/25/2015 0333   LDLCALC 62 05/25/2015 0333      Wt Readings from Last 3 Encounters:  03/15/17 113 kg (249 lb 3.2 oz)  12/17/16 116.2 kg (256 lb 2.8 oz)  11/10/16 111.1 kg (245 lb)     Other studies Reviewed: Additional studies/ records that were reviewed today include: . Review of the above records demonstrates:  Please see elsewhere in the note.     ASSESSMENT AND PLAN:  # Hypertensive heart disease:  Blood pressure much better controlled.  However she is taking both lasix 80mg  bid and HCTZ-triamterene.  She is also reporting cramping.  We will stop HCTZ-triamterene and increase valsartan to 160mg  daily.  Amlodipine was stopped for unclear reasons.  She will need a BMP at follow up.    # Chronic diastolic heart failure:  # Obesity: # Shortness of breath: # Asthma/COPD: Lori Bean is euvolemic on exam today.  She was encouraged to start exercising most days of the week.  # Chest pain: Resolved.   she had a negative stress test 05/2015.  # Tobacco abuse: Encouraged smoking cessation.   # Morbid Obesity: We discussed her weight gain and the importance of limiting calories and increasing her physical exercise.  Current medicines are reviewed at length with the patient today.  The patient does not have concerns regarding medicines.  The  following changes have been made:  Increase valsartan to 160mg  daily.  Stop HCTZ-triamterene.  Labs/ tests ordered today include:    No orders of the defined types were placed in this encounter.    Disposition:  FU with Dr. Jonelle Sidle C. Rafael Gonzalez in 4 months.  APP in 2 weeks.    Signed, [name], MD  03/15/2017 10:22 AM    [facility]

## 2017-03-15 ENCOUNTER — Ambulatory Visit (INDEPENDENT_AMBULATORY_CARE_PROVIDER_SITE_OTHER): Payer: Medicaid Other | Admitting: Cardiovascular Disease

## 2017-03-15 ENCOUNTER — Encounter (INDEPENDENT_AMBULATORY_CARE_PROVIDER_SITE_OTHER): Payer: Self-pay

## 2017-03-15 ENCOUNTER — Encounter: Payer: Self-pay | Admitting: Cardiovascular Disease

## 2017-03-15 VITALS — BP 110/78 | HR 96 | Ht 62.0 in | Wt 249.2 lb

## 2017-03-15 DIAGNOSIS — I10 Essential (primary) hypertension: Secondary | ICD-10-CM

## 2017-03-15 DIAGNOSIS — R0602 Shortness of breath: Secondary | ICD-10-CM

## 2017-03-15 DIAGNOSIS — Z720 Tobacco use: Secondary | ICD-10-CM

## 2017-03-15 DIAGNOSIS — I5032 Chronic diastolic (congestive) heart failure: Secondary | ICD-10-CM

## 2017-03-15 MED ORDER — DIOVAN 160 MG PO TABS: 160.0000 mg | ORAL_TABLET | Freq: Every day | ORAL | 5 refills | Status: DC

## 2017-03-15 NOTE — Patient Instructions (Addendum)
Medication Instructions:  INCREASE YOUR VALSARTAN TO 160 MG DAILY  STOP TRIAMTERENE-HCTZ (MAXZIDE)   Labwork: NONE  Testing/Procedures: NONE  Follow-Up: Your physician recommends that you schedule a follow-up appointment in: [place] recommends that you schedule a follow-up appointment in: [place]  If you need a refill on your cardiac medications before your next appointment, please call your pharmacy.

## 2017-04-01 ENCOUNTER — Ambulatory Visit: Payer: Medicaid Other

## 2017-04-02 ENCOUNTER — Telehealth: Payer: Self-pay | Admitting: Cardiovascular Disease

## 2017-04-02 MED ORDER — AMLODIPINE BESYLATE 5 MG PO TABS
5.0000 mg | ORAL_TABLET | Freq: Every day | ORAL | 3 refills | Status: DC
Start: 2017-04-02 — End: 2017-07-20

## 2017-04-02 NOTE — Telephone Encounter (Signed)
S/w Dr Oval Linsey she says to take amlodipine 5mg  daily if BP has not went down in 2 days double the Diovan to 320mg .  Pt notified of Dr Quintella Reichert message above, she will take her BP BID and will call back if anything else is needed

## 2017-04-02 NOTE — Telephone Encounter (Signed)
S/w pharmacy they state that pt's medication is not included in current recall it is brand name medication.  Spoke with pt she states that this week her BP "has been running in the triple digits on the top and the bottom numbers" she states that her BP today is 188/166.

## 2017-04-02 NOTE — Telephone Encounter (Signed)
New Message  Pt c/o medication issue:  1. Name of Medication: per pt does not know the name, but its her bp medication; starts with a D  2. How are you currently taking this medication (dosage and times per day)? [name]...  3. Are you having a reaction (difficulty breathing--STAT)? bp issues   4. What is your medication issue? Pt would like to speak with RN about a new bp medication. Please call back to discuss

## 2017-04-06 NOTE — Progress Notes (Signed)
Patient ID: Lori Bean                 DOB: 30-Jul-1971                      MRN: [id number]     HPI: Lori Bean is a 46 y.o. female referred by Dr. Oval Linsey to HTN clinic. PMH includes HF, hypertension, OSA, asthma, schizophrenia, bipolar disorder, obesity, and prior PE. Patient blood pressure has been in the "triple digits" recently but no headaches, chest pain or dizziness to report. No additional changes noted today. Bilateral lower extremities very swollen but patient report improvement on edema.   Current HTN meds:  Amlodipine 5mg  daily Diovan 160mg  daily  Furosemide 80mg  twice daily  Previously tried:   BP goal: <130/80  Family History: Diabetes in her father and mother; Hypertension in her mother.   Social History:  Current smoker, never used smokeless tobacco. She reports that she does not drink alcohol or use drugs.   Diet: not following any special diet; lont time spent discussing low sodium diet  Exercise: sedentary at this time  Home BP readings: 170/100 this morning; no other records available  Wt Readings from Last 3 Encounters:  03/15/17 249 lb 3.2 oz (113 kg)  12/17/16 256 lb 2.8 oz (116.2 kg)  11/10/16 245 lb (111.1 kg)   BP Readings from Last 3 Encounters:  04/07/17 (!) 160/74  03/15/17 110/78  12/18/16 (!) 162/89   Pulse Readings from Last 3 Encounters:  04/07/17 80  03/15/17 96  12/18/16 89    Past Medical History:  Diagnosis Date  . Anginal pain ([place])    occ from asthma  . Anxiety   . Arthritis   . Asthma   . Bipolar affective disorder ([place])    Schizophrenia  . Bronchitis    hx of  . CHF (congestive heart failure) ([place])   . Depression   . GERD (gastroesophageal reflux disease)   . Heart murmur   . Hypertension   . Neuropathy    left leg , "from back surgery"  . Overactive bladder   . Schizophrenia ([place])   . Sciatica   . Shortness of breath   . Snoring disorder    Pt stated my boyfriend always wakes me up and tells  me to breathe    Current Outpatient Prescriptions on File Prior to Visit  Medication Sig Dispense Refill  . ADVAIR DISKUS 250-50 MCG/DOSE AEPB Inhale 1 puff into the lungs 2 (two) times daily.  0  . albuterol (PROAIR HFA) 108 (90 Base) MCG/ACT inhaler INHALE TWO PUFFS INTO THE LUNGS EVERY SIX HOURS AS NEEDED FOR WHEEZING OR SHORTNESS OF BREATH (Patient taking differently: Inhale 2 puffs into the lungs every 6 (six) hours as needed for wheezing or shortness of breath. ) 8.5 g 3  . amLODipine (NORVASC) 5 MG tablet Take 1 tablet (5 mg total) by mouth daily. 30 tablet 3  . ARIPiprazole (ABILIFY) 20 MG tablet Take 1 tablet (20 mg total) by mouth at bedtime. (Patient taking differently: Take 20 mg by mouth 2 (two) times daily. ) 30 tablet 0  . Beclomethasone Diprop HFA (QVAR REDIHALER) 80 MCG/ACT AERB Inhale 2 puffs into the lungs 2 (two) times daily. 1 Inhaler 3  . cephALEXin (KEFLEX) 500 MG capsule Take 1 capsule by mouth 2 (two) times daily. For 10 days  0  . DULoxetine (CYMBALTA) 30 MG capsule Take 1 capsule by mouth 2 (two) times  daily.  3  . fluticasone (CUTIVATE) 0.05 % cream Apply 1 application topically 2 (two) times daily. For 14 days  0  . furosemide (LASIX) 80 MG tablet Take 80 mg by mouth 2 (two) times daily.    [name] NICOTINE STEP 3 7 MG/24HR patch Apply 1 patch topically daily.  0  . omeprazole (PRILOSEC) 20 MG capsule Take 1 capsule by mouth daily.  3  . Potassium Chloride ER 20 MEQ TBCR Take 20 mEq by mouth 2 (two) times daily. 60 tablet 0  . predniSONE (DELTASONE) 20 MG tablet Take 2 tablets (40 mg total) by mouth daily with breakfast. 8 tablet 0  . pregabalin (LYRICA) 150 MG capsule Take 150 mg by mouth daily.    . SYMBICORT 160-4.5 MCG/ACT inhaler Inhale 2 puffs into the lungs 2 (two) times daily.  3  . tiZANidine (ZANAFLEX) 4 MG tablet Take 1 tablet by mouth every 8 (eight) hours as needed.  1  . traMADol (ULTRAM) 50 MG tablet Take 50 mg by mouth 4 (four) times daily as needed for  pain.  0  . triamcinolone cream (KENALOG) 0.1 % Apply 1 application topically 2 (two) times daily as needed (foot rash). 30 g 2  . VESICARE 10 MG tablet Take 10 mg by mouth daily.  0  . [DISCONTINUED] solifenacin (VESICARE) 5 MG tablet Take 5 mg by mouth daily.     No current facility-administered medications on file prior to visit.     Allergies  Allergen Reactions  . Aspirin Nausea And Vomiting    Blood pressure (!) 160/74, pulse 80.  Hypertension Patient blood pressure today remains above desired goal of <130/80. During office visit long time was spent discussing advantages of low sodium diet. Will increase Diovan to 320mg  daily as previously reccommended by Dr.[place]. Patient to monitor blood pressure twice daily and keep log to bring to next office visit in 4 weeks.  Plan to repeat BMET during next office visit as well.  Jacquita Mulhearn Rodriguez-Guzman PharmD, [place] [address] 04/07/2017 9:37 AM

## 2017-04-07 ENCOUNTER — Telehealth: Payer: Self-pay | Admitting: *Deleted

## 2017-04-07 ENCOUNTER — Ambulatory Visit (INDEPENDENT_AMBULATORY_CARE_PROVIDER_SITE_OTHER): Payer: Medicaid Other | Admitting: Pharmacist

## 2017-04-07 ENCOUNTER — Encounter: Payer: Self-pay | Admitting: Pharmacist

## 2017-04-07 VITALS — BP 160/74 | HR 80

## 2017-04-07 DIAGNOSIS — I10 Essential (primary) hypertension: Secondary | ICD-10-CM | POA: Diagnosis not present

## 2017-04-07 MED ORDER — DIOVAN 320 MG PO TABS: 320.0000 mg | ORAL_TABLET | Freq: Every day | ORAL | 5 refills | Status: DC

## 2017-04-07 MED ORDER — VALSARTAN 320 MG PO TABS: 320.0000 mg | ORAL_TABLET | Freq: Every day | ORAL | 1 refills | Status: DC

## 2017-04-07 MED ORDER — LOSARTAN POTASSIUM 100 MG PO TABS: 100.0000 mg | ORAL_TABLET | Freq: Every day | ORAL | 5 refills | Status: DC

## 2017-04-07 NOTE — Assessment & Plan Note (Signed)
Patient blood pressure today remains above desired goal of <130/80. During office visit long time was spent discussing advantages of low sodium diet. Will increase Diovan to 320mg  daily as previously reccommended by Dr.Ethel. Patient to monitor blood pressure twice daily and keep log to bring to next office visit in 4 weeks.  Plan to repeat BMET during next office visit as well.

## 2017-04-07 NOTE — Telephone Encounter (Signed)
Sent new Rx for name brand which is not part of recall and is what patient is currently taking D/C losartan Rx

## 2017-04-07 NOTE — Patient Instructions (Addendum)
Return for a  follow up appointment in 4 weeks  Your blood pressure today is 160/78 pulse 80   Check your blood pressure at home daily (if able) and keep record of the readings.  Take your BP meds as follows: *Increase Diovan to 320mg  daily* (okay to take 2 tablets of 160mg  until all one) *Continue all other medication as prescribed*   Bring BP cuff and your record of home blood pressures to your next appointment.  Exercise as you're able, try to walk approximately 30 minutes per day.  Keep salt intake to a minimum, especially watch canned and prepared boxed foods.  Eat more fresh fruits and vegetables and fewer canned items.  Avoid eating in fast food restaurants.    HOW TO TAKE YOUR BLOOD PRESSURE: . Rest 5 minutes before taking your blood pressure. .  Don't smoke or drink caffeinated beverages for at least 30 minutes before. . Take your blood pressure before (not after) you eat. . Sit comfortably with your back supported and both feet on the floor (don't cross your legs). . Elevate your arm to heart level on a table or a desk. . Use the proper sized cuff. It should fit smoothly and snugly around your bare upper arm. There should be enough room to slip a fingertip under the cuff. The bottom edge of the cuff should be 1 inch above the crease of the elbow. . Ideally, take 3 measurements at one sitting and record the average.

## 2017-04-09 ENCOUNTER — Emergency Department (HOSPITAL_BASED_OUTPATIENT_CLINIC_OR_DEPARTMENT_OTHER): Payer: Medicaid Other

## 2017-04-09 ENCOUNTER — Emergency Department (HOSPITAL_COMMUNITY)
Admission: EM | Admit: 2017-04-09 | Discharge: 2017-04-09 | Disposition: A | Discharge status: Home / Self Care | Payer: Medicaid Other | Attending: Emergency Medicine | Admitting: Emergency Medicine

## 2017-04-09 ENCOUNTER — Encounter (HOSPITAL_COMMUNITY): Payer: Self-pay | Admitting: Emergency Medicine

## 2017-04-09 DIAGNOSIS — I5032 Chronic diastolic (congestive) heart failure: Secondary | ICD-10-CM | POA: Insufficient documentation

## 2017-04-09 DIAGNOSIS — F17210 Nicotine dependence, cigarettes, uncomplicated: Secondary | ICD-10-CM | POA: Insufficient documentation

## 2017-04-09 DIAGNOSIS — M79609 Pain in unspecified limb: Secondary | ICD-10-CM

## 2017-04-09 DIAGNOSIS — M7989 Other specified soft tissue disorders: Secondary | ICD-10-CM | POA: Diagnosis not present

## 2017-04-09 DIAGNOSIS — J45909 Unspecified asthma, uncomplicated: Secondary | ICD-10-CM | POA: Insufficient documentation

## 2017-04-09 DIAGNOSIS — M79606 Pain in leg, unspecified: Secondary | ICD-10-CM | POA: Diagnosis present

## 2017-04-09 DIAGNOSIS — R2243 Localized swelling, mass and lump, lower limb, bilateral: Secondary | ICD-10-CM | POA: Diagnosis not present

## 2017-04-09 DIAGNOSIS — I110 Hypertensive heart disease with heart failure: Secondary | ICD-10-CM | POA: Diagnosis not present

## 2017-04-09 DIAGNOSIS — Z79899 Other long term (current) drug therapy: Secondary | ICD-10-CM | POA: Diagnosis not present

## 2017-04-09 LAB — COMPREHENSIVE METABOLIC PANEL
ALT: 29 U/L (ref 14–54)
AST: 14 U/L — ABNORMAL LOW (ref 15–41)
Albumin: 3.6 g/dL (ref 3.5–5.0)
Alkaline Phosphatase: 71 U/L (ref 38–126)
Anion gap: 9 (ref 5–15)
BUN: 12 mg/dL (ref 6–20)
CO2: 22 mmol/L (ref 22–32)
Calcium: 9.2 mg/dL (ref 8.9–10.3)
Chloride: 106 mmol/L (ref 101–111)
Creatinine, Ser: 0.78 mg/dL (ref 0.44–1.00)
GFR calc Af Amer: >60 mL/min (ref >60)
GFR calc non Af Amer: >60 mL/min (ref >60)
Glucose, Bld: 98 mg/dL (ref 65–99)
Potassium: 4.4 mmol/L (ref 3.5–5.1)
Sodium: 137 mmol/L (ref 135–145)
Total Bilirubin: 0.5 mg/dL (ref 0.3–1.2)
Total Protein: 7 g/dL (ref 6.5–8.1)

## 2017-04-09 LAB — CBC WITH DIFFERENTIAL/PLATELET
Basophils Absolute: 0 10*3/uL (ref 0.0–0.1)
Basophils Relative: 0 %
Eosinophils Absolute: 0.4 10*3/uL (ref 0.0–0.7)
Eosinophils Relative: 3 %
HCT: 42 % (ref 36.0–46.0)
Hemoglobin: 13.7 g/dL (ref 12.0–15.0)
Lymphocytes Relative: 24 %
Lymphs Abs: 2.6 10*3/uL (ref 0.7–4.0)
MCH: 29.9 pg (ref 26.0–34.0)
MCHC: 32.6 g/dL (ref 30.0–36.0)
MCV: 91.7 fL (ref 78.0–100.0)
Monocytes Absolute: 0.8 10*3/uL (ref 0.1–1.0)
Monocytes Relative: 8 %
Neutro Abs: 7 10*3/uL (ref 1.7–7.7)
Neutrophils Relative %: 65 %
Platelets: 237 10*3/uL (ref 150–400)
RBC: 4.58 MIL/uL (ref 3.87–5.11)
RDW: 15.3 % (ref 11.5–15.5)
WBC: 10.8 10*3/uL — ABNORMAL HIGH (ref 4.0–10.5)

## 2017-04-09 LAB — D-DIMER, QUANTITATIVE: D-Dimer, Quant: 0.66 ug/mL-FEU — ABNORMAL HIGH (ref 0.00–0.50)

## 2017-04-09 NOTE — Progress Notes (Signed)
*  PRELIMINARY RESULTS* Vascular Ultrasound Lower extremity venous duplex has been completed.  Preliminary findings: No evidence of DVT bilaterally. Left baker's cyst noted.    Landry Mellow, RDMS, RVT  04/09/2017, 1:58 PM

## 2017-04-09 NOTE — ED Triage Notes (Addendum)
bilateraL LEG PAIN X 2 WEEKS AND  Rt foot pain x 3 days  , it hurts to bear weight on left  , came from dr office today and she has hx of clot in that l left eg and they had  Trouble finding pulse in that foot, both feet swelling and left has plus 4 pitting edema has been off xelralto x 2 years

## 2017-04-09 NOTE — ED Provider Notes (Signed)
[facility] DEPT Provider Note   CSN: [id number] Arrival date & time: 04/09/17  1125     History   Chief Complaint Chief Complaint  Patient presents with  . Leg Pain  . Foot Swelling    HPI Lori Bean is a 46 y.o. female.  HPI  46 year old female history of DVT, schizophrenia, sciatica, CHF presents today with bilateral lower extremity swelling of the past 2 weeks. She was seen in Dr. Bronson Ing office today. She has had worsening bilateral foot pain. Right is now worse than left. Pain is worse with weightbearing. She has no history of trauma. She reported that today she presented to her primary care office and they had problems finding pulse in her left foot and was sent secondary to that.  Past Medical History:  Diagnosis Date  . Anginal pain ([place])    occ from asthma  . Anxiety   . Arthritis   . Asthma   . Bipolar affective disorder ([place])    Schizophrenia  . Bronchitis    hx of  . CHF (congestive heart failure) ([place])   . Depression   . GERD (gastroesophageal reflux disease)   . Heart murmur   . Hypertension   . Neuropathy    left leg , "from back surgery"  . Overactive bladder   . Schizophrenia ([place])   . Sciatica   . Shortness of breath   . Snoring disorder    Pt stated my boyfriend always wakes me up and tells me to breathe    Patient Active Problem List   Diagnosis Date Noted  . Asthma exacerbation 12/17/2016  . 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 Synovial plica of right knee 07/11/2535  . Tear of medial meniscus of right knee, initial encounter 09/28/2016  . OSA (obstructive sleep apnea) 09/23/2016  . Chronic diastolic CHF (congestive heart failure) ([place]) 04/22/2016  . Prediabetes 04/22/2016  . Tobacco abuse disorder 04/03/2014  . Obesity (BMI 30-39.9) 04/03/2014  . Chest pain 04/03/2014  . Mild cardiomegaly 02/21/2014  . Lumbar spondylosis 12/28/2013  . Extrinsic asthma 06/07/2013  . Anxiety   . Hypertension   . Bipolar  affective disorder ([place])   . GERD (gastroesophageal reflux disease)   . Arthritis   . Schizophrenia ([place])   . Overactive bladder     Past Surgical History:  Procedure Laterality Date  . BACK SURGERY     3 back surgeries  . CHOLECYSTECTOMY    . COLONOSCOPY WITH PROPOFOL N/A 01/23/2016   Procedure: COLONOSCOPY WITH PROPOFOL;  Surgeon: Wonda Horner, MD;  Location: [facility];  Service: Endoscopy;  Laterality: N/A;  . EYE SURGERY     Metal plate in right eye. Had fracture in right eye  . gallstones reomved    . KNEE ARTHROSCOPY WITH MENISCAL REPAIR Right 09/28/2016   Procedure: KNEE ARTHROSCOPY WITH MENISCAL REPAIR;  Surgeon: Dorna Leitz, MD;  Location: [facility];  Service: Orthopedics;  Laterality: Right;  Right partial meniscectomy and chondroplasty, patellar/femoral joint and medial femoral condyle   . LUMBAR LAMINECTOMY/DECOMPRESSION MICRODISCECTOMY  04/01/2012   Procedure: LUMBAR LAMINECTOMY/DECOMPRESSION MICRODISCECTOMY 2 LEVELS;  Surgeon: Faythe Ghee, MD;  Location: [facility];  Service: Neurosurgery;  Laterality: Left;  Lumbar four-five, lumbar five sacral one microdiscectomy   . LUMBAR WOUND DEBRIDEMENT  04/29/2012   Procedure: LUMBAR WOUND DEBRIDEMENT;  Surgeon: Faythe Ghee, MD;  Location: [facility];  Service: Neurosurgery;  Laterality: N/A;  lumbar wound debridement  . ROTATOR CUFF REPAIR  Right shoulder  . TUBAL LIGATION      OB History    No data available       Home Medications    Prior to Admission medications   Medication Sig Start Date End Date Taking? Authorizing Provider  ADVAIR DISKUS 250-50 MCG/DOSE AEPB Inhale 1 puff into the lungs 2 (two) times daily. 12/11/16  Yes [provider]  albuterol (PROAIR HFA) 108 (90 Base) MCG/ACT inhaler INHALE TWO PUFFS INTO THE LUNGS EVERY SIX HOURS AS NEEDED FOR WHEEZING OR SHORTNESS OF BREATH Patient taking differently: Inhale 2 puffs into the lungs every 6 (six) hours as needed for wheezing or shortness  of breath.  11/07/15  Yes Lance Bosch, NP  amLODipine (NORVASC) 5 MG tablet Take 1 tablet (5 mg total) by mouth daily. 04/02/17 07/01/17 Yes [name], MD  ARIPiprazole (ABILIFY) 20 MG tablet Take 1 tablet (20 mg total) by mouth at bedtime. Patient taking differently: Take 20 mg by mouth 2 (two) times daily.  03/04/15  Yes Lance Bosch, NP  Beclomethasone Diprop HFA (QVAR REDIHALER) 80 MCG/ACT AERB Inhale 2 puffs into the lungs 2 (two) times daily. 11/10/16  Yes Parrett, [name] S, NP  cephALEXin (KEFLEX) 500 MG capsule Take 1 capsule by mouth 2 (two) times daily. For 10 days 03/12/17  Yes [provider]  DIOVAN 320 MG tablet Take 1 tablet (320 mg total) by mouth daily. 04/07/17  Yes [name], MD  DULoxetine (CYMBALTA) 30 MG capsule Take 1 capsule by mouth 2 (two) times daily. 03/12/17  Yes [provider]  fluticasone (CUTIVATE) 0.05 % cream Apply 1 application topically 2 (two) times daily. For 14 days 03/12/17  Yes [provider]  furosemide (LASIX) 80 MG tablet Take 80 mg by mouth 2 (two) times daily.   Yes [provider]  losartan (COZAAR) 100 MG tablet Take 1 tablet (100 mg total) by mouth daily. 04/07/17 07/06/17 Yes [name], MD  NICOTINE STEP 3 7 MG/24HR patch Apply 1 patch topically daily. 03/12/17  Yes [provider]  omeprazole (PRILOSEC) 20 MG capsule Take 1 capsule by mouth daily. 03/12/17  Yes [provider]  Potassium Chloride ER 20 MEQ TBCR Take 20 mEq by mouth 2 (two) times daily. 12/18/16  Yes Mikhail, Velta Addison, DO  predniSONE (DELTASONE) 20 MG tablet Take 2 tablets (40 mg total) by mouth daily with breakfast. 12/19/16  Yes Mikhail, Velta Addison, DO  pregabalin (LYRICA) 150 MG capsule Take 150 mg by mouth daily.   Yes [provider]  SYMBICORT 160-4.5 MCG/ACT inhaler Inhale 2 puffs into the lungs 2 (two) times daily. 03/12/17  Yes [provider]  tiZANidine (ZANAFLEX) 4 MG tablet Take 1 tablet  by mouth every 8 (eight) hours as needed. 03/12/17  Yes [provider]  traMADol (ULTRAM) 50 MG tablet Take 50 mg by mouth 4 (four) times daily as needed for pain. 12/03/16  Yes [provider]  triamcinolone cream (KENALOG) 0.1 % Apply 1 application topically 2 (two) times daily as needed (foot rash). 11/07/15  Yes Lance Bosch, NP  VESICARE 10 MG tablet Take 10 mg by mouth daily. 11/13/16  Yes [provider]    Family History Family History  Problem Relation Age of Onset  . Diabetes Mother   . Hypertension Mother   . Diabetes Father   . Heart disease Paternal Aunt   . Cancer Paternal Aunt     Social History Social History  Substance Use Topics  .  Smoking status: Current Every Day Smoker    Packs/day: 0.50    Years: 24.00    Types: Cigarettes    Last attempt to quit: 05/23/2014  . Smokeless tobacco: Never Used  . Alcohol use No     Comment: quit Nov. 2014     Allergies   Aspirin   Review of Systems Review of Systems  All other systems reviewed and are negative.    Physical Exam Updated Vital Signs BP (!) 155/85   Pulse 79   Temp 98.5 F (36.9 C) (Oral)   Resp 18   SpO2 100%   Physical Exam  Constitutional: She is oriented to person, place, and time. She appears well-developed and well-nourished. No distress.  HENT:  Head: Normocephalic and atraumatic.  Right Ear: External ear normal.  Left Ear: External ear normal.  Nose: Nose normal.  Eyes: Pupils are equal, round, and reactive to light. Conjunctivae and EOM are normal.  Neck: Normal range of motion. Neck supple.  Cardiovascular: Normal rate and regular rhythm.   Pulmonary/Chest: Effort normal and breath sounds normal.  Abdominal: Soft. Bowel sounds are normal.  Musculoskeletal: Normal range of motion. She exhibits edema.  Bilateral foot edema. Pulses are palpable but mobile right dorsal pedalis and right posterior tibialis as well as left posterior tibialis. Left dorsal pedalis  pulse is obtained by Doppler Mild swelling of her legs.  Neurological: She is alert and oriented to person, place, and time. She exhibits normal muscle tone. Coordination normal.  Skin: Skin is warm and dry.  Psychiatric: She has a normal mood and affect. Her behavior is normal. Thought content normal.  Nursing note and vitals reviewed.    ED Treatments / Results  Labs (all labs ordered are listed, but only abnormal results are displayed) Labs Reviewed  CBC WITH DIFFERENTIAL/PLATELET - Abnormal; Notable for the following:       Result Value   WBC 10.8 (*)    All other components within normal limits  COMPREHENSIVE METABOLIC PANEL - Abnormal; Notable for the following:    AST 14 (*)    All other components within normal limits  D-DIMER, QUANTITATIVE (NOT AT [facility]) - Abnormal; Notable for the following:    D-Dimer, Quant 0.66 (*)    All other components within normal limits    EKG  EKG Interpretation None       Radiology No results found.  Procedures Procedures (including critical care time)  Medications Ordered in ED Medications - No data to display   Initial Impression / Assessment and Plan / ED Course  I have reviewed the triage vital signs and the nursing notes.  Pertinent labs & imaging results that were available during my care of the patient were reviewed by me and considered in my medical decision making (see chart for details).    46 year old female with bilateral lower extremity swelling without evidence of CHF. Doppler negative for DVT. There is probable Baker's cyst in the left leg. Discussed with patient return precautions and need for follow-up and she voices understanding  Final Clinical Impressions(s) / ED Diagnoses   Final diagnoses:  Swelling of lower extremity    New Prescriptions New Prescriptions   No medications on file     Pattricia Boss, MD 04/09/17 1530

## 2017-04-09 NOTE — Discharge Instructions (Signed)
You appear to have a Baker's cyst of the left leg.  You can treat this with elevation and mild compression.  Keep both your legs elevated as much as possible and use compression stockings.

## 2017-04-09 NOTE — ED Notes (Signed)
Pt given snack per Dr. Jeanell Sparrow.

## 2017-04-13 ENCOUNTER — Other Ambulatory Visit: Payer: Self-pay

## 2017-04-13 DIAGNOSIS — R609 Edema, unspecified: Secondary | ICD-10-CM

## 2017-05-05 ENCOUNTER — Ambulatory Visit (INDEPENDENT_AMBULATORY_CARE_PROVIDER_SITE_OTHER): Payer: Medicaid Other | Admitting: Internal Medicine

## 2017-05-05 ENCOUNTER — Ambulatory Visit (INDEPENDENT_AMBULATORY_CARE_PROVIDER_SITE_OTHER): Payer: Medicaid Other | Admitting: Pharmacist

## 2017-05-05 VITALS — BP 150/86 | HR 90 | Ht 62.0 in | Wt 254.0 lb

## 2017-05-05 VITALS — BP 150/86 | HR 90 | Wt 254.6 lb

## 2017-05-05 DIAGNOSIS — J441 Chronic obstructive pulmonary disease with (acute) exacerbation: Secondary | ICD-10-CM | POA: Diagnosis not present

## 2017-05-05 DIAGNOSIS — I5032 Chronic diastolic (congestive) heart failure: Secondary | ICD-10-CM | POA: Diagnosis not present

## 2017-05-05 DIAGNOSIS — R0609 Other forms of dyspnea: Secondary | ICD-10-CM

## 2017-05-05 DIAGNOSIS — R0602 Shortness of breath: Secondary | ICD-10-CM

## 2017-05-05 DIAGNOSIS — J45901 Unspecified asthma with (acute) exacerbation: Secondary | ICD-10-CM

## 2017-05-05 DIAGNOSIS — R0600 Dyspnea, unspecified: Secondary | ICD-10-CM | POA: Insufficient documentation

## 2017-05-05 DIAGNOSIS — I10 Essential (primary) hypertension: Secondary | ICD-10-CM

## 2017-05-05 DIAGNOSIS — Z720 Tobacco use: Secondary | ICD-10-CM

## 2017-05-05 NOTE — Progress Notes (Signed)
Patient ID: Lori Bean                 DOB: 09-28-1970                      MRN: [id number]     HPI: Lori Bean is a 46 y.o. female referred by Dr. Oval Linsey to HTN clinic. PMH includes HF, hypertension, OSA, asthma, schizophrenia, bipolar disorder, obesity, and prior history of PE. Patient blood pressure has been in the "triple digits" recently but no headaches, chest pain or dizziness to report. No additional changes noted today. Reports dizzy spells, increase chest and nasal congestion, shortness of breath, inability to lay down flat , and worse lower extremity edema. BMET done 04/09/2017 shows stable renal function with Scr = 0.78 and all electrolytes within normal limits.  Home weight changes per patient report (no records available) 248lb Monday, 242lb Tuesday, 246lb this morning  Current HTN meds:  Amlodipine 5mg  daily Diovan 320mg  daily Furosemide 80mg  twice daily  BP goal: <130/80  Family History: Diabetes in her father and mother; Hypertension in her mother.  Social History: Current smoker, never used smokeless tobacco. She reports that she does not drink alcohol or use drugs.  Diet: not following any special diet; lont time spent discussing low sodium diet  Exercise: sedentary at this time  Home BP readings: 28 reading; average 152/94; 80-98bpm  Wt Readings from Last 3 Encounters:  05/05/17 254 lb (115.2 kg)  05/05/17 254 lb 9.6 oz (115.5 kg)  03/15/17 249 lb 3.2 oz (113 kg)   BP Readings from Last 3 Encounters:  05/05/17 (!) 150/86  05/05/17 (!) 150/86  04/09/17 (!) 164/96   Pulse Readings from Last 3 Encounters:  05/05/17 90  05/05/17 90  04/09/17 84    Past Medical History:  Diagnosis Date  . Anginal pain ([place])    occ from asthma  . Anxiety   . Arthritis   . Asthma   . Bipolar affective disorder ([place])    Schizophrenia  . Bronchitis    hx of  . CHF (congestive heart failure) ([place])   . Depression   . GERD (gastroesophageal  reflux disease)   . Heart murmur   . Hypertension   . Neuropathy    left leg , "from back surgery"  . Overactive bladder   . Schizophrenia ([place])   . Sciatica   . Shortness of breath   . Snoring disorder    Pt stated my boyfriend always wakes me up and tells me to breathe    Current Outpatient Prescriptions on File Prior to Visit  Medication Sig Dispense Refill  . ADVAIR DISKUS 250-50 MCG/DOSE AEPB Inhale 1 puff into the lungs 2 (two) times daily.  0  . albuterol (PROAIR HFA) 108 (90 Base) MCG/ACT inhaler INHALE TWO PUFFS INTO THE LUNGS EVERY SIX HOURS AS NEEDED FOR WHEEZING OR SHORTNESS OF BREATH (Patient taking differently: Inhale 2 puffs into the lungs every 6 (six) hours as needed for wheezing or shortness of breath. ) 8.5 g 3  . amLODipine (NORVASC) 5 MG tablet Take 1 tablet (5 mg total) by mouth daily. 30 tablet 3  . ARIPiprazole (ABILIFY) 20 MG tablet Take 1 tablet (20 mg total) by mouth at bedtime. (Patient taking differently: Take 20 mg by mouth 2 (two) times daily. ) 30 tablet 0  . Beclomethasone Diprop HFA (QVAR REDIHALER) 80 MCG/ACT AERB Inhale 2 puffs into the lungs 2 (two) times daily. 1 Inhaler  3  . cephALEXin (KEFLEX) 500 MG capsule Take 1 capsule by mouth 2 (two) times daily. For 10 days  0  . DIOVAN 320 MG tablet Take 1 tablet (320 mg total) by mouth daily. 30 tablet 5  . DULoxetine (CYMBALTA) 30 MG capsule Take 1 capsule by mouth 2 (two) times daily.  3  . fluticasone (CUTIVATE) 0.05 % cream Apply 1 application topically 2 (two) times daily. For 14 days  0  . furosemide (LASIX) 80 MG tablet Take 80 mg by mouth 2 (two) times daily.    [name] NICOTINE STEP 3 7 MG/24HR patch Apply 1 patch topically daily.  0  . omeprazole (PRILOSEC) 20 MG capsule Take 1 capsule by mouth daily.  3  . Potassium Chloride ER 20 MEQ TBCR Take 20 mEq by mouth 2 (two) times daily. 60 tablet 0  . predniSONE (DELTASONE) 20 MG tablet Take 2 tablets (40 mg total) by mouth daily with breakfast. 8 tablet 0    . pregabalin (LYRICA) 150 MG capsule Take 150 mg by mouth daily.    . SYMBICORT 160-4.5 MCG/ACT inhaler Inhale 2 puffs into the lungs 2 (two) times daily.  3  . tiZANidine (ZANAFLEX) 4 MG tablet Take 1 tablet by mouth every 8 (eight) hours as needed.  1  . traMADol (ULTRAM) 50 MG tablet Take 50 mg by mouth 4 (four) times daily as needed for pain.  0  . triamcinolone cream (KENALOG) 0.1 % Apply 1 application topically 2 (two) times daily as needed (foot rash). 30 g 2  . VESICARE 10 MG tablet Take 10 mg by mouth daily.  0  . [DISCONTINUED] solifenacin (VESICARE) 5 MG tablet Take 5 mg by mouth daily.     No current facility-administered medications on file prior to visit.     Allergies  Allergen Reactions  . Aspirin Nausea And Vomiting    Blood pressure (!) 150/86, pulse 90, weight 254 lb 9.6 oz (115.5 kg), SpO2 93 %.  Hypertension [place] pressure remain uncontrolled in office and at home with an average reading of 152/94. Patient severely short of breath and wheezing. Weight in office is up 5lb since last office visit as well. Due to prior history of PE, decompensated HF and current symptoms; patient was also evaluated by triage nurse. Desicion was made for patient to see DOD for complete assessment and further recommendations.    Mistina Coatney Rodriguez-Guzman PharmD, [place] [address] 05/05/2017 9:52 AM

## 2017-05-05 NOTE — Patient Instructions (Addendum)
Bring all of your meds, your BP cuff and your record of home blood pressures to your next appointment.  Exercise as you're able, try to walk approximately 30 minutes per day.  Keep salt intake to a minimum, especially watch canned and prepared boxed foods.  Eat more fresh fruits and vegetables and fewer canned items.  Avoid eating in fast food restaurants.    HOW TO TAKE YOUR BLOOD PRESSURE: . Rest 5 minutes before taking your blood pressure. .  Don't smoke or drink caffeinated beverages for at least 30 minutes before. . Take your blood pressure before (not after) you eat. . Sit comfortably with your back supported and both feet on the floor (don't cross your legs). . Elevate your arm to heart level on a table or a desk. . Use the proper sized cuff. It should fit smoothly and snugly around your bare upper arm. There should be enough room to slip a fingertip under the cuff. The bottom edge of the cuff should be 1 inch above the crease of the elbow. . Ideally, take 3 measurements at one sitting and record the average.

## 2017-05-05 NOTE — Progress Notes (Signed)
OFFICE FOLLOW-UP NOTE  Chief Complaint:  Shortness of breath, weight gain, cough  Primary Care Physician: [name], PA  HPI:  Lori Bean is a 46 y.o. female with a past medial history significant for morbid obesity, asthma/COPD, bronchitis, diastolic congestive heart failure, and recurrent shortness of breath. She is an add-on to my schedule is the doctor of the day. She was seen today for blood pressure management and was noted to be complaining of worsening shortness of breath. She was just seen in emergency department for lower extremity swelling. She received venous Dopplers which were negative for DVT. Subsequently she's been referred to Vascular surgery for unknown etiology. D-dimer was mildly elevated. There are potentially other reasons for that. She reports some weight gain and her weight is up about 5-7 pounds. She says she has some lower extremity swelling and occasionally gets blisters on the top of her feet. She is also had cough, particularly worse at night and has been using her nebulizer which she said provides fairly prompt relief in her symptoms. She's felt that she's had nasal congestion as well. She denies any chest pain.  PMHx:  Past Medical History:  Diagnosis Date  . Anginal pain ([place])    occ from asthma  . Anxiety   . Arthritis   . Asthma   . Bipolar affective disorder ([place])    Schizophrenia  . Bronchitis    hx of  . CHF (congestive heart failure) ([place])   . Depression   . GERD (gastroesophageal reflux disease)   . Heart murmur   . Hypertension   . Neuropathy    left leg , "from back surgery"  . Overactive bladder   . Schizophrenia ([place])   . Sciatica   . Shortness of breath   . Snoring disorder    Pt stated my boyfriend always wakes me up and tells me to breathe    Past Surgical History:  Procedure Laterality Date  . BACK SURGERY     3 back surgeries  . CHOLECYSTECTOMY    . COLONOSCOPY WITH PROPOFOL N/A 01/23/2016   Procedure:  COLONOSCOPY WITH PROPOFOL;  Surgeon: Wonda Horner, MD;  Location: [facility];  Service: Endoscopy;  Laterality: N/A;  . EYE SURGERY     Metal plate in right eye. Had fracture in right eye  . gallstones reomved    . KNEE ARTHROSCOPY WITH MENISCAL REPAIR Right 09/28/2016   Procedure: KNEE ARTHROSCOPY WITH MENISCAL REPAIR;  Surgeon: Dorna Leitz, MD;  Location: [place];  Service: Orthopedics;  Laterality: Right;  Right partial meniscectomy and chondroplasty, patellar/femoral joint and medial femoral condyle   . LUMBAR LAMINECTOMY/DECOMPRESSION MICRODISCECTOMY  04/01/2012   Procedure: LUMBAR LAMINECTOMY/DECOMPRESSION MICRODISCECTOMY 2 LEVELS;  Surgeon: Faythe Ghee, MD;  Location: [facility];  Service: Neurosurgery;  Laterality: Left;  Lumbar four-five, lumbar five sacral one microdiscectomy   . LUMBAR WOUND DEBRIDEMENT  04/29/2012   Procedure: LUMBAR WOUND DEBRIDEMENT;  Surgeon: Faythe Ghee, MD;  Location: [facility];  Service: Neurosurgery;  Laterality: N/A;  lumbar wound debridement  . ROTATOR CUFF REPAIR     Right shoulder  . TUBAL LIGATION      FAMHx:  Family History  Problem Relation Age of Onset  . Diabetes Mother   . Hypertension Mother   . Diabetes Father   . Heart disease Paternal Aunt   . Cancer Paternal Aunt     SOCHx:   reports that she has been smoking Cigarettes.  She has  a 12.00 pack-year smoking history. She has never used smokeless tobacco. She reports that she does not drink alcohol or use drugs.  ALLERGIES:  Allergies  Allergen Reactions  . Aspirin Nausea And Vomiting    ROS: Pertinent items noted in HPI and remainder of comprehensive ROS otherwise negative.  HOME MEDS: Current Outpatient Prescriptions on File Prior to Visit  Medication Sig Dispense Refill  . ADVAIR DISKUS 250-50 MCG/DOSE AEPB Inhale 1 puff into the lungs 2 (two) times daily.  0  . albuterol (PROAIR HFA) 108 (90 Base) MCG/ACT inhaler INHALE TWO PUFFS INTO THE LUNGS EVERY SIX HOURS AS  NEEDED FOR WHEEZING OR SHORTNESS OF BREATH (Patient taking differently: Inhale 2 puffs into the lungs every 6 (six) hours as needed for wheezing or shortness of breath. ) 8.5 g 3  . amLODipine (NORVASC) 5 MG tablet Take 1 tablet (5 mg total) by mouth daily. 30 tablet 3  . ARIPiprazole (ABILIFY) 20 MG tablet Take 1 tablet (20 mg total) by mouth at bedtime. (Patient taking differently: Take 20 mg by mouth 2 (two) times daily. ) 30 tablet 0  . Beclomethasone Diprop HFA (QVAR REDIHALER) 80 MCG/ACT AERB Inhale 2 puffs into the lungs 2 (two) times daily. 1 Inhaler 3  . cephALEXin (KEFLEX) 500 MG capsule Take 1 capsule by mouth 2 (two) times daily. For 10 days  0  . DIOVAN 320 MG tablet Take 1 tablet (320 mg total) by mouth daily. 30 tablet 5  . DULoxetine (CYMBALTA) 30 MG capsule Take 1 capsule by mouth 2 (two) times daily.  3  . fluticasone (CUTIVATE) 0.05 % cream Apply 1 application topically 2 (two) times daily. For 14 days  0  . furosemide (LASIX) 80 MG tablet Take 80 mg by mouth 2 (two) times daily.    [name] NICOTINE STEP 3 7 MG/24HR patch Apply 1 patch topically daily.  0  . omeprazole (PRILOSEC) 20 MG capsule Take 1 capsule by mouth daily.  3  . Potassium Chloride ER 20 MEQ TBCR Take 20 mEq by mouth 2 (two) times daily. 60 tablet 0  . predniSONE (DELTASONE) 20 MG tablet Take 2 tablets (40 mg total) by mouth daily with breakfast. 8 tablet 0  . pregabalin (LYRICA) 150 MG capsule Take 150 mg by mouth daily.    . SYMBICORT 160-4.5 MCG/ACT inhaler Inhale 2 puffs into the lungs 2 (two) times daily.  3  . tiZANidine (ZANAFLEX) 4 MG tablet Take 1 tablet by mouth every 8 (eight) hours as needed.  1  . traMADol (ULTRAM) 50 MG tablet Take 50 mg by mouth 4 (four) times daily as needed for pain.  0  . triamcinolone cream (KENALOG) 0.1 % Apply 1 application topically 2 (two) times daily as needed (foot rash). 30 g 2  . VESICARE 10 MG tablet Take 10 mg by mouth daily.  0  . [DISCONTINUED] solifenacin (VESICARE) 5  MG tablet Take 5 mg by mouth daily.     No current facility-administered medications on file prior to visit.     LABS/IMAGING: No results found for this or any previous visit (from the past 48 hour(s)). No results found.  LIPID PANEL:    Component Value Date/Time   CHOL 133 05/25/2015 0333   TRIG 105 05/25/2015 0333   HDL 50 05/25/2015 0333   CHOLHDL 2.7 05/25/2015 0333   VLDL 21 05/25/2015 0333   LDLCALC 62 05/25/2015 0333     WEIGHTS: Wt Readings from Last 3 Encounters:  05/05/17 254  lb (115.2 kg)  05/05/17 254 lb 9.6 oz (115.5 kg)  03/15/17 249 lb 3.2 oz (113 kg)    VITALS: BP (!) 150/86   Pulse 90   Ht 5\' 2"  (1.575 m)   Wt 254 lb (115.2 kg)   BMI 46.46 kg/m   EXAM: General appearance: alert, no distress and morbidly obese Neck: no carotid bruit, no JVD and thyroid not enlarged, symmetric, no tenderness/mass/nodules Lungs: diminished breath sounds bilaterally and wheezes bibasilar Heart: regular rate and rhythm, S1, S2 normal, no murmur, click, rub or gallop Abdomen: soft, non-tender; bowel sounds normal; no masses,  no organomegaly and obese Extremities: edema trace to 1+ pedal edema, healed blisters Pulses: 2+ and symmetric Skin: Skin color, texture, turgor normal. No rashes or lesions Neurologic: Grossly normal Psych: Pleasant  EKG: Normal sinus rhythm 86, minimal voltage criteria for LVH, nonspecific T-wave changes - personally reviewed  ASSESSMENT: 1. Acute shortness of breath and lower extremity edema 2. Remote history of diastolic congestive heart failure 3. Asthma/COPD with wheezing 4. Nasal congestion  PLAN: 1.   Mrs. Blundell has a shortness of breath over the past several days which is probably multifactorial. She carefully has nasal congestion and sinus pressure which I have recommended an over-the-counter nasal steroid. There is some component of asthma COPD as her symptoms are improved with an nebulizer and I encouraged her to use that  routinely over the next few days. She's also had some weight gain and mild lower extremity edema for which she may benefit from short increase in her diuretics. I've advised her to increase her Lasix to 120 mg twice a day for 3 days and then go back to 80 mg twice daily. Her last echo was 2 years ago. This showed normal systolic function and reportedly grade 2 diastolic dysfunction. I'll defer to her primary cardiologist is whether not this seems to be repeated. She also has a follow-up with pulmonary in about 10 days and they can reassess her pulmonary status. I don't believe today that she has enough significant wheezing or poor airflow to warrant placing her on steroids.  Follow-up with Dr. Oval Linsey.  Pixie Casino, MD, [place]  Attending Cardiologist  Direct Dial: [phone]  Fax: [phone]  Website:  [email or web address] 05/05/2017, 10:04 AM

## 2017-05-05 NOTE — Assessment & Plan Note (Signed)
Bloor pressure remain uncontrolled in office and at home with an average reading of 152/94. Patient severely short of breath and wheezing. Weight in office is up 5lb since last office visit as well. Due to prior history of PE, decompensated HF and current symptoms; patient was also evaluated by triage nurse. Desicion was made for patient to see DOD for complete assessment and further recommendations.

## 2017-05-05 NOTE — Patient Instructions (Signed)
Dr. Debara Pickett has recommended the following: -- INCREASE lasix to 120mg  twice daily for 3 days -- Use Nasonex over the counter as directed (for nasal congestion) -- Use nebulizer 3-4 times daily

## 2017-05-06 ENCOUNTER — Emergency Department (HOSPITAL_COMMUNITY): Payer: Medicaid Other

## 2017-05-06 ENCOUNTER — Emergency Department (HOSPITAL_COMMUNITY)
Admission: EM | Admit: 2017-05-06 | Discharge: 2017-05-06 | Disposition: A | Discharge status: Home / Self Care | Payer: Medicaid Other | Attending: Emergency Medicine | Admitting: Emergency Medicine

## 2017-05-06 ENCOUNTER — Encounter (HOSPITAL_COMMUNITY): Payer: Self-pay

## 2017-05-06 DIAGNOSIS — I509 Heart failure, unspecified: Secondary | ICD-10-CM | POA: Diagnosis not present

## 2017-05-06 DIAGNOSIS — F17210 Nicotine dependence, cigarettes, uncomplicated: Secondary | ICD-10-CM | POA: Diagnosis not present

## 2017-05-06 DIAGNOSIS — R0789 Other chest pain: Secondary | ICD-10-CM | POA: Diagnosis not present

## 2017-05-06 DIAGNOSIS — Z79899 Other long term (current) drug therapy: Secondary | ICD-10-CM | POA: Diagnosis not present

## 2017-05-06 DIAGNOSIS — R51 Headache: Secondary | ICD-10-CM | POA: Diagnosis not present

## 2017-05-06 DIAGNOSIS — I110 Hypertensive heart disease with heart failure: Secondary | ICD-10-CM | POA: Diagnosis not present

## 2017-05-06 DIAGNOSIS — J449 Chronic obstructive pulmonary disease, unspecified: Secondary | ICD-10-CM | POA: Insufficient documentation

## 2017-05-06 DIAGNOSIS — R0602 Shortness of breath: Secondary | ICD-10-CM | POA: Diagnosis present

## 2017-05-06 DIAGNOSIS — J45909 Unspecified asthma, uncomplicated: Secondary | ICD-10-CM | POA: Diagnosis not present

## 2017-05-06 LAB — POCT I-STAT TROPONIN I: Troponin i, poc: 0 ng/mL (ref 0.00–0.08)

## 2017-05-06 LAB — CBC
HCT: 40.4 % (ref 36.0–46.0)
Hemoglobin: 13.6 g/dL (ref 12.0–15.0)
MCH: 30.8 pg (ref 26.0–34.0)
MCHC: 33.7 g/dL (ref 30.0–36.0)
MCV: 91.6 fL (ref 78.0–100.0)
Platelets: 256 10*3/uL (ref 150–400)
RBC: 4.41 MIL/uL (ref 3.87–5.11)
RDW: 14.7 % (ref 11.5–15.5)
WBC: 10.6 10*3/uL — ABNORMAL HIGH (ref 4.0–10.5)

## 2017-05-06 LAB — BASIC METABOLIC PANEL
Anion gap: 5 (ref 5–15)
BUN: 11 mg/dL (ref 6–20)
CO2: 27 mmol/L (ref 22–32)
Calcium: 9.1 mg/dL (ref 8.9–10.3)
Chloride: 108 mmol/L (ref 101–111)
Creatinine, Ser: 0.67 mg/dL (ref 0.44–1.00)
GFR calc Af Amer: >60 mL/min (ref >60)
GFR calc non Af Amer: >60 mL/min (ref >60)
Glucose, Bld: 115 mg/dL — ABNORMAL HIGH (ref 65–99)
Potassium: 3.9 mmol/L (ref 3.5–5.1)
Sodium: 140 mmol/L (ref 135–145)

## 2017-05-06 LAB — I-STAT TROPONIN, ED: Troponin i, poc: 0 ng/mL (ref 0.00–0.08)

## 2017-05-06 LAB — D-DIMER, QUANTITATIVE: D-Dimer, Quant: 0.47 ug/mL-FEU (ref 0.00–0.50)

## 2017-05-06 LAB — BRAIN NATRIURETIC PEPTIDE: B Natriuretic Peptide: 12.9 pg/mL (ref 0.0–100.0)

## 2017-05-06 MED ORDER — IPRATROPIUM-ALBUTEROL 0.5-2.5 (3) MG/3ML IN SOLN
3.0000 mL | Freq: Once | RESPIRATORY_TRACT | Status: AC
  Administered 2017-05-06: 3 mL via RESPIRATORY_TRACT
  Filled 2017-05-06: qty 3

## 2017-05-06 NOTE — ED Triage Notes (Signed)
Patient with c/o hypertension. Patient reports she takes 320mg  Diovan daily for hypertension for the past month, but reports "I just cant get my blood pressure down." Patient also has history of CHF and takes 120mg  PO Lasix daily (starting yesterday.) Patient denies chest pain, but reports "Im short of breath, but its not any different than my usual shortness of breath because of my fluid." Patient reports she had a 3pm appointment to follow up with her PCP for her blood pressure, but she came to [facility] instead because "I dont have a car, so I had to come to a place I know ill get taken care of fast."

## 2017-05-06 NOTE — ED Notes (Signed)
Patient transported to CT 

## 2017-05-06 NOTE — ED Notes (Signed)
Pt c/o SOB and stating that she cannot breathe in the lobby. Pt then speaking in complete sentences on cell phone in triage as vital signs are rechecked.

## 2017-05-06 NOTE — ED Notes (Signed)
Pt left stating "I have to go my ride is here". She did not get her d/c paperwork as it had not yet been printed. Pt did not allow RN or NT to get her vitals because she stated "I have to go."

## 2017-05-06 NOTE — ED Notes (Signed)
Pt had drawn in triage for labs:  Gold  Blue Lt green Lavender Dark green x2

## 2017-05-06 NOTE — ED Provider Notes (Signed)
[facility] [facility] DEPT Provider Note   CSN: [id number] Arrival date & time: 05/06/17  1216     History   Chief Complaint Chief Complaint  Patient presents with  . Hypertension  . Shortness of Breath    HPI Lori Bean is a 46 y.o. female.  HPI  Patient, with a past medical history of CHF, HTN, GERD, anxiety, asthma, presents to ED for 1 day history of SOB, increased leg swelling bilaterally, cough productive with mucus, chest pain. She reports high BP readings at home despite reported compliance with Diovan. She states that the SOB is worse with walking short distances. States her PCP recently increased her dose of Lasix from 80mg  bid to 120mg  bid starting yesterday. She also reports chills, diarrhea, headache, bilateral leg pain. She denies vomiting, abdominal pain, numbness, weakness.  Past Medical History:  Diagnosis Date  . Anginal pain ([place])    occ from asthma  . Anxiety   . Arthritis   . Asthma   . Bipolar affective disorder ([place])    Schizophrenia  . Bronchitis    hx of  . CHF (congestive heart failure) ([place])   . Depression   . GERD (gastroesophageal reflux disease)   . Heart murmur   . Hypertension   . Neuropathy    left leg , "from back surgery"  . Overactive bladder   . Schizophrenia ([place])   . Sciatica   . Shortness of breath   . Snoring disorder    Pt stated my boyfriend always wakes me up and tells me to breathe    Patient Active Problem List   Diagnosis Date Noted  . SOB (shortness of breath) 05/05/2017  . Asthma exacerbation in COPD ([place]) 05/05/2017  . Morbid obesity ([place]) 05/05/2017  . Asthma exacerbation 12/17/2016  . 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 Synovial plica of right knee 56/38/7564  . Tear of medial meniscus of right knee, initial encounter 09/28/2016  . OSA (obstructive sleep apnea) 09/23/2016  . Chronic diastolic heart failure ([place]) 04/22/2016  . Prediabetes 04/22/2016  . Tobacco abuse 04/03/2014  . Obesity (BMI  30-39.9) 04/03/2014  . Chest pain 04/03/2014  . Mild cardiomegaly 02/21/2014  . Lumbar spondylosis 12/28/2013  . Extrinsic asthma 06/07/2013  . Anxiety   . Hypertension   . Bipolar affective disorder ([place])   . GERD (gastroesophageal reflux disease)   . Arthritis   . Schizophrenia ([place])   . Overactive bladder     Past Surgical History:  Procedure Laterality Date  . BACK SURGERY     3 back surgeries  . CHOLECYSTECTOMY    . COLONOSCOPY WITH PROPOFOL N/A 01/23/2016   Procedure: COLONOSCOPY WITH PROPOFOL;  Surgeon: Wonda Horner, MD;  [facility]: [facility];  Service: Endoscopy;  Laterality: N/A;  . EYE SURGERY     Metal plate in right eye. Had fracture in right eye  . gallstones reomved    . KNEE ARTHROSCOPY WITH MENISCAL REPAIR Right 09/28/2016   Procedure: KNEE ARTHROSCOPY WITH MENISCAL REPAIR;  Surgeon: Dorna Leitz, MD;  Location: [facility];  Service: Orthopedics;  Laterality: Right;  Right partial meniscectomy and chondroplasty, patellar/femoral joint and medial femoral condyle   . LUMBAR LAMINECTOMY/DECOMPRESSION MICRODISCECTOMY  04/01/2012   Procedure: LUMBAR LAMINECTOMY/DECOMPRESSION MICRODISCECTOMY 2 LEVELS;  Surgeon: Faythe Ghee, MD;  Location: [facility];  Service: Neurosurgery;  Laterality: Left;  Lumbar four-five, lumbar five sacral one microdiscectomy   . LUMBAR WOUND DEBRIDEMENT  04/29/2012   Procedure: LUMBAR WOUND DEBRIDEMENT;  Surgeon: Louie Casa  Sharlyne Cai, MD;  Location: [facility];  Service: Neurosurgery;  Laterality: N/A;  lumbar wound debridement  . ROTATOR CUFF REPAIR     Right shoulder  . TUBAL LIGATION      OB History    No data available       Home Medications    Prior to Admission medications   Medication Sig Start Date End Date Taking? Authorizing Provider  ADVAIR DISKUS 250-50 MCG/DOSE AEPB Inhale 1 puff into the lungs 2 (two) times daily. 12/11/16  Yes [provider]  albuterol (PROAIR HFA) 108 (90 Base) MCG/ACT inhaler INHALE TWO PUFFS INTO  THE LUNGS EVERY SIX HOURS AS NEEDED FOR WHEEZING OR SHORTNESS OF BREATH Patient taking differently: Inhale 2 puffs into the lungs every 6 (six) hours as needed for wheezing or shortness of breath.  11/07/15  Yes Lance Bosch, NP  albuterol (PROVENTIL) (2.5 MG/3ML) 0.083% nebulizer solution USE 1 VIAL VIA NEBULIZER Q 6 H 04/16/17  Yes [provider]  amLODipine (NORVASC) 5 MG tablet Take 1 tablet (5 mg total) by mouth daily. 04/02/17 07/01/17 Yes [name], MD  ARIPiprazole (ABILIFY) 20 MG tablet Take 1 tablet (20 mg total) by mouth at bedtime. Patient taking differently: Take 20 mg by mouth 2 (two) times daily.  03/04/15  Yes Lance Bosch, NP  Beclomethasone Diprop HFA (QVAR REDIHALER) 80 MCG/ACT AERB Inhale 2 puffs into the lungs 2 (two) times daily. 11/10/16  Yes Parrett, [name] S, NP  DIOVAN 320 MG tablet Take 1 tablet (320 mg total) by mouth daily. 04/07/17  Yes [name], MD  DULoxetine (CYMBALTA) 30 MG capsule Take 1 capsule by mouth 2 (two) times daily. 03/12/17  Yes [provider]  furosemide (LASIX) 80 MG tablet Take 80 mg by mouth 2 (two) times daily.   Yes [provider]  omeprazole (PRILOSEC) 20 MG capsule Take 1 capsule by mouth daily. 03/12/17  Yes [provider]  Potassium Chloride ER 20 MEQ TBCR Take 20 mEq by mouth 2 (two) times daily. 12/18/16  Yes Mikhail, Maryann, DO  pregabalin (LYRICA) 150 MG capsule Take 150 mg by mouth daily.   Yes [provider]  SYMBICORT 160-4.5 MCG/ACT inhaler Inhale 2 puffs into the lungs 2 (two) times daily. 03/12/17  Yes [provider]  tiZANidine (ZANAFLEX) 4 MG tablet Take 1 tablet by mouth every 8 (eight) hours as needed. 03/12/17  Yes [provider]  triamcinolone cream (KENALOG) 0.1 % Apply 1 application topically 2 (two) times daily as needed (foot rash). 11/07/15  Yes Lance Bosch, NP  predniSONE (DELTASONE) 20 MG tablet Take 2 tablets (40 mg total) by mouth daily  with breakfast. Patient not taking: Reported on 05/06/2017 12/19/16   Cristal Ford, DO    Family History Family History  Problem Relation Age of Onset  . Diabetes Mother   . Hypertension Mother   . Diabetes Father   . Heart disease Paternal Aunt   . Cancer Paternal Aunt     Social History Social History  Substance Use Topics  . Smoking status: Current Every Day Smoker    Packs/day: 0.50    Years: 24.00    Types: Cigarettes    Last attempt to quit: 05/23/2014  . Smokeless tobacco: Never Used  . Alcohol use No     Comment: quit Nov. 2014     Allergies   Aspirin   Review of Systems Review of Systems  Constitutional: Positive for chills. Negative for appetite  change and fever.  HENT: Negative for ear pain, rhinorrhea, sneezing and sore throat.   Eyes: Negative for photophobia and visual disturbance.  Respiratory: Positive for cough and shortness of breath. Negative for chest tightness and wheezing.   Cardiovascular: Positive for chest pain and leg swelling. Negative for palpitations.  Gastrointestinal: Positive for diarrhea. Negative for abdominal pain, blood in stool, constipation, nausea and vomiting.  Genitourinary: Negative for dysuria, hematuria and urgency.  Musculoskeletal: Negative for myalgias.  Skin: Negative for rash.  Neurological: Negative for dizziness, weakness and light-headedness.     Physical Exam Updated Vital Signs BP (!) 141/99   Pulse 86   Temp 98.9 F (37.2 C) (Oral)   Resp 18   Ht 5\' 2"  (1.575 m)   Wt 109.8 kg (242 lb)   LMP 04/25/2017   SpO2 93%   BMI 44.26 kg/m   Physical Exam  Constitutional: She is oriented to person, place, and time. She appears well-developed and well-nourished. No distress.  Nontoxic appearing and in no acute distress.  HENT:  Head: Normocephalic and atraumatic.  Nose: Nose normal.  Eyes: Pupils are equal, round, and reactive to light. Conjunctivae and EOM are normal. Right eye exhibits no discharge. Left  eye exhibits no discharge. No scleral icterus.  Neck: Normal range of motion. Neck supple.  Full active and passive range of motion of the neck. No midline C-spine tenderness noted.  Cardiovascular: Normal rate, regular rhythm, normal heart sounds and intact distal pulses.  Exam reveals no gallop and no friction rub.   No murmur heard. Pulmonary/Chest: Effort normal and breath sounds normal. No respiratory distress.  Abdominal: Soft. Bowel sounds are normal. She exhibits no distension. There is no tenderness. There is no guarding.  Musculoskeletal: Normal range of motion. She exhibits no edema.  Neurological: She is alert and oriented to person, place, and time. No cranial nerve deficit or sensory deficit. She exhibits normal muscle tone. Coordination normal.  Pupils reactive. No facial asymmetry noted. Cranial nerves appear grossly intact. Sensation intact to light touch on face, BUE and BLE. Strength 5/5 in BUE and BLE. Normal patellar reflexes bilaterally.  Skin: Skin is warm and dry. No rash noted. She is not diaphoretic.  Psychiatric: She has a normal mood and affect.  Nursing note and vitals reviewed.    ED Treatments / Results  Labs (all labs ordered are listed, but only abnormal results are displayed) Labs Reviewed  BASIC METABOLIC PANEL - Abnormal; Notable for the following:       Result Value   Glucose, Bld 115 (*)    All other components within normal limits  CBC - Abnormal; Notable for the following:    WBC 10.6 (*)    All other components within normal limits  BRAIN NATRIURETIC PEPTIDE  D-DIMER, QUANTITATIVE (NOT AT [facility])  I-STAT TROPONIN, ED  I-STAT TROPONIN, ED  POCT I-STAT TROPONIN I    EKG  EKG Interpretation  Date/Time:  Thursday May 06 2017 13:06:25 EDT Ventricular Rate:  86 PR Interval:    QRS Duration: 85 QT Interval:  404 QTC Calculation: 484 R Axis:   25 Text Interpretation:  Sinus rhythm Anteroseptal infarct, old NO STEMI No significant change  since last tracing Confirmed by Addison Lank [id number]) on 05/06/2017 8:13:11 PM       Radiology Dg Chest 2 View  Result Date: 05/06/2017 CLINICAL DATA:  Chest pain and shortness of breath. EXAM: CHEST  2 VIEW COMPARISON:  12/17/2016 FINDINGS: Subsegmental atelectasis left midlung. The lungs  are clear without focal pneumonia, edema, pneumothorax or pleural effusion. Cardiopericardial silhouette is at upper limits of normal for size. The visualized bony structures of the thorax are intact. IMPRESSION: No active cardiopulmonary disease. Electronically Signed   By: Misty Stanley M.D.   On: 05/06/2017 14:08   Ct Head Wo Contrast  Result Date: 05/06/2017 CLINICAL DATA:  Hypertension. EXAM: CT HEAD WITHOUT CONTRAST TECHNIQUE: Contiguous axial images were obtained from the base of the skull through the vertex without intravenous contrast. COMPARISON:  03/20/2011 FINDINGS: Brain: There is no evidence for acute hemorrhage, hydrocephalus, mass lesion, or abnormal extra-axial fluid collection. No definite CT evidence for acute infarction. Vascular: No hyperdense vessel or unexpected calcification. Skull: No evidence for fracture. No worrisome lytic or sclerotic lesion. Sinuses/Orbits: The visualized paranasal sinuses and mastoid air cells are clear. Visualized portions of the globes and intraorbital fat are unremarkable. Other: None. IMPRESSION: Normal noncontrast CT evaluation of the brain. Electronically Signed   By: Misty Stanley M.D.   On: 05/06/2017 19:04    Procedures Procedures (including critical care time)  Medications Ordered in ED Medications  ipratropium-albuterol (DUONEB) 0.5-2.5 (3) MG/3ML nebulizer solution 3 mL (3 mLs Nebulization Given 05/06/17 2023)     Initial Impression / Assessment and Plan / ED Course  I have reviewed the triage vital signs and the nursing notes.  Pertinent labs & imaging results that were available during my care of the patient were reviewed by me and considered  in my medical decision making (see chart for details).     Patient, with a past medical history of CHF, HTN, GERD, anxiety presents to ED for 1 day history of shortness of breath, leg swelling bilaterally and productive cough. She also reports headache, which she does have a history of but states that this is "feels different." She denies any neck pain or trouble moving the neck. She reports have blood pressure readings at home. She does state she has compliance with Diovan. She is on a recently increased dose of Lasix to 120 mg twice daily beginning yesterday. On physical exam her lungs are clear to auscultation bilaterally. She is not tachycardic or tachypneic. She is afebrile with no history of fever. Chest x-ray negative for acute cardiopulmonary disease. CT of the head was negative.  CBC, BMP unremarkable. Troponin negative 2. D-dimer negative. EKG shows no changes from previous tracings. Patient reports much improvement in her symptoms with nebulizer treatment given here in the ED. Encouraged patient to follow up with PCP for further evaluation and medication changes as needed. I have low suspicion for cardiac cause of her chest pain. I doubt infectious process as the cause of her headache. Patient appears stable for discharge at this time. Strict return precautions given.  Final Clinical Impressions(s) / ED Diagnoses   Final diagnoses:  Chest wall pain    New Prescriptions Discharge Medication List as of 05/06/2017  9:25 PM       Delia Heady, PA-C 05/06/17 [place], MD 05/07/17 [phone]

## 2017-05-06 NOTE — Discharge Instructions (Signed)
Please read attached information regarding your condition. Continue home medications as previously prescribed. Follow-up with primary care provider for further evaluation and medication changes. Return to ED for worsening chest pain, increased headache, injuries, trouble breathing, trouble swallowing, vision changes, trouble breathing.

## 2017-05-12 IMAGING — CT CT ABD-PELV W/ CM
3 of 5 series · 13 of 36 positions shown, 19 images · IV contrast (READICAT/WATER & [ID] ISOVUE 300)
Comparison: CT scan of July 21, 2009.

CLINICAL DATA: Epigastric abdominal pain for 1 year.

EXAM:
CT ABDOMEN AND PELVIS WITH CONTRAST
TECHNIQUE: Multidetector CT imaging of the abdomen and pelvis was performed
using the standard protocol following bolus administration of
intravenous contrast.
CONTRAST:  125mL S6JWET-SCC IOPAMIDOL (S6JWET-SCC) INJECTION 61%

[Series 3: abd/pelvis with · axial · 0.86mm/px · z∈[-264,+16]mm · 6 of 80 slices shown, 11 images]
[im 12/80  soft-tissue]
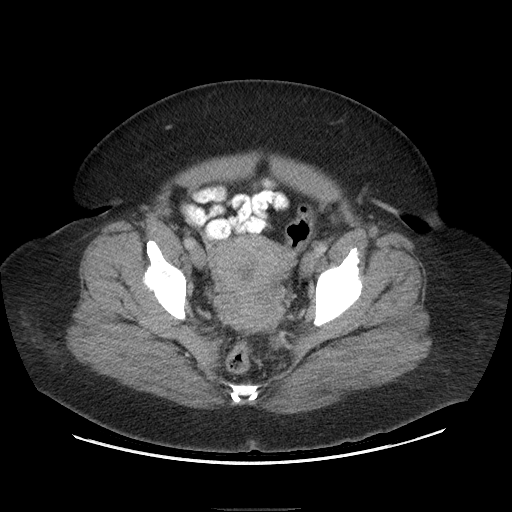
[im 12/80  bone]
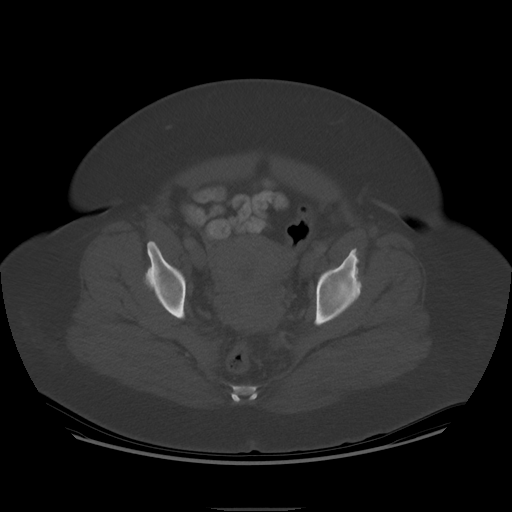
[im 23/80  soft-tissue]
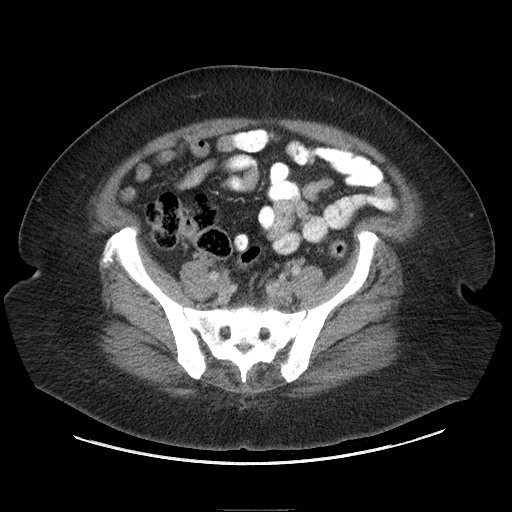
[im 34/80  soft-tissue]
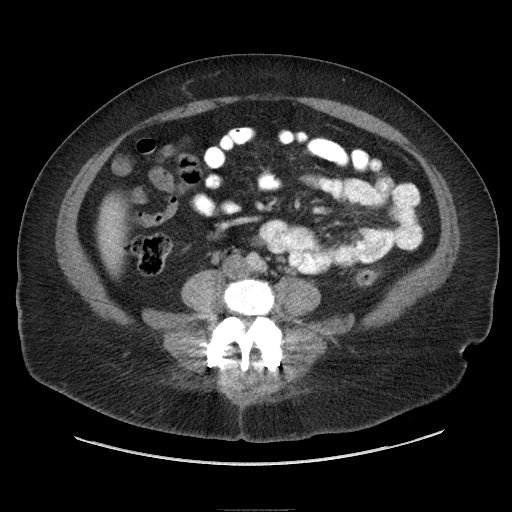
[im 34/80  lung]
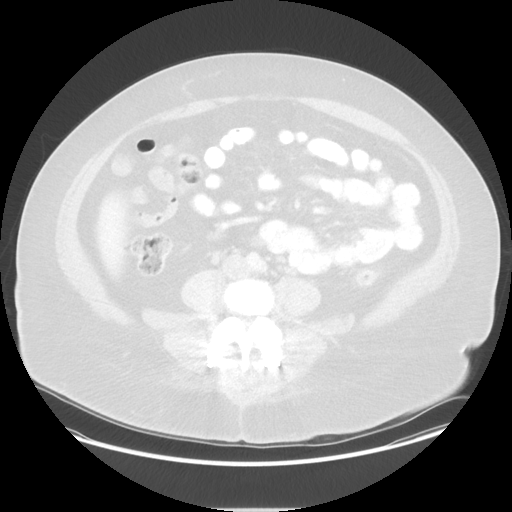
[im 46/80  soft-tissue]
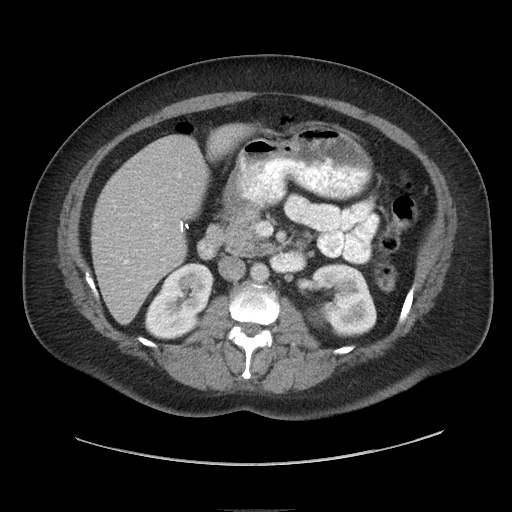
[im 46/80  lung]
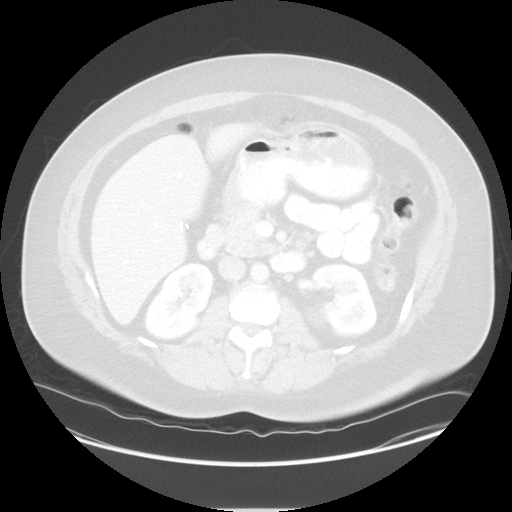
[im 57/80  soft-tissue]
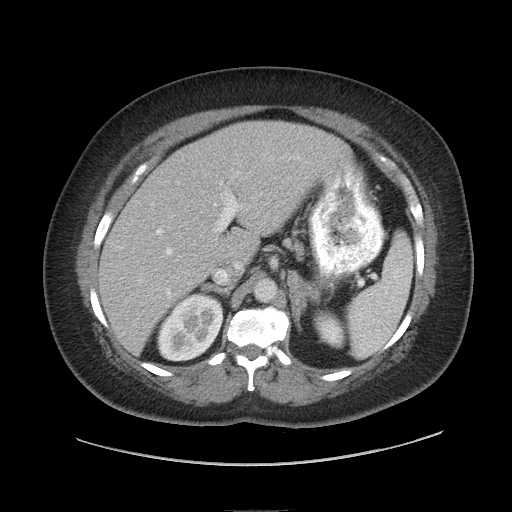
[im 57/80  lung]
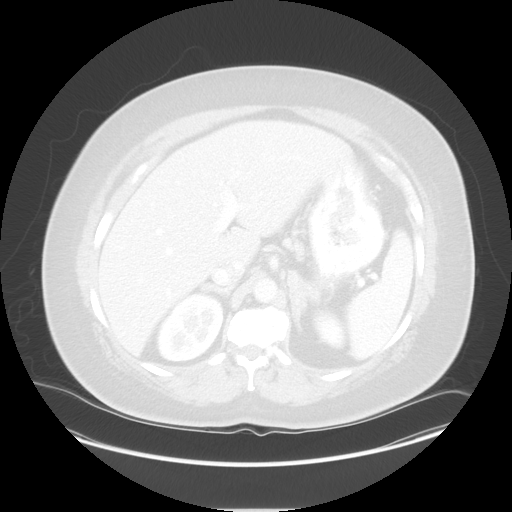
[im 68/80  soft-tissue]
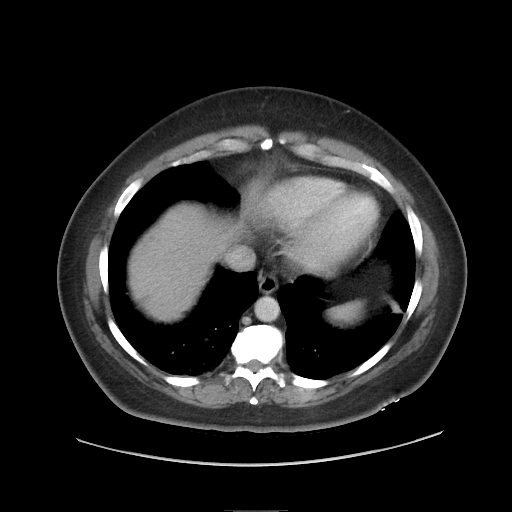
[im 68/80  lung]
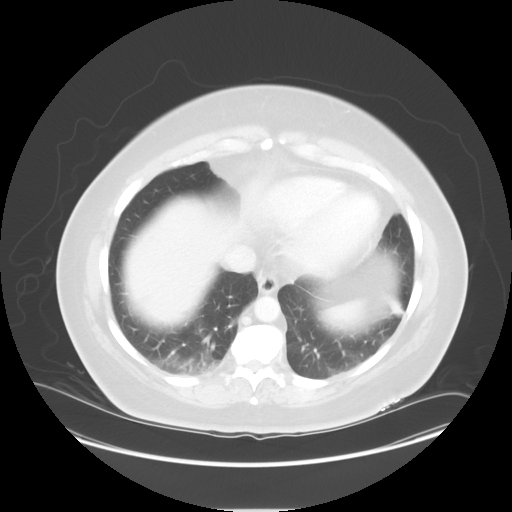

[Series 601: coronal body · coronal · 0.88mm/px · 1 of 141 slices shown, 2 images]
[im 47/141  soft-tissue]
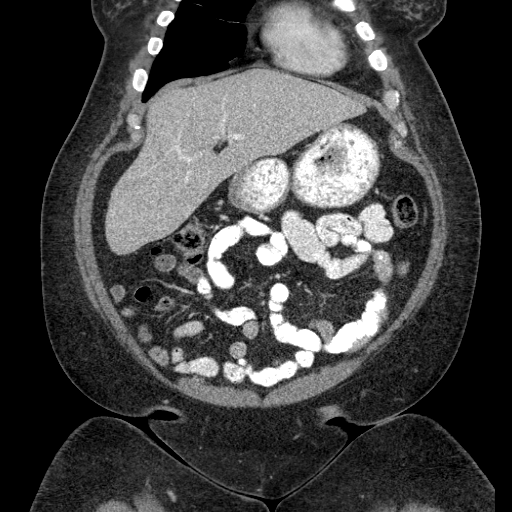
[im 47/141  bone]
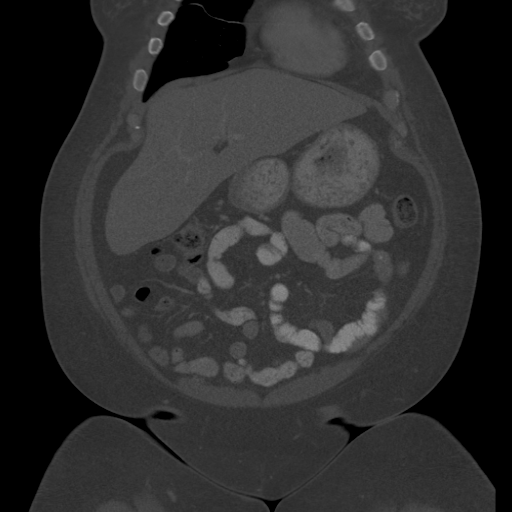

[Series 602: sagittal body · sagittal · 0.88mm/px · 6 of 177 slices shown]
[im 20/177  soft-tissue]
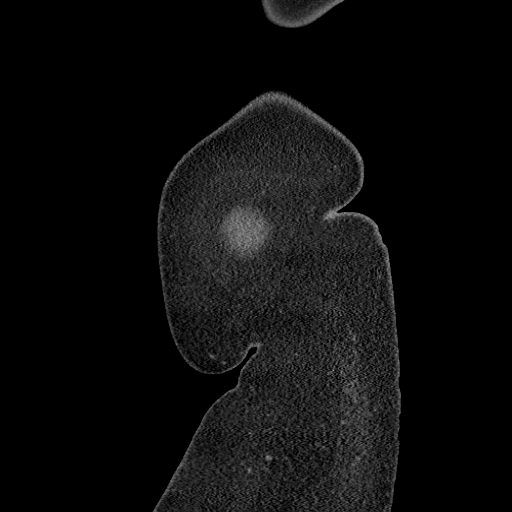
[im 40/177  soft-tissue]
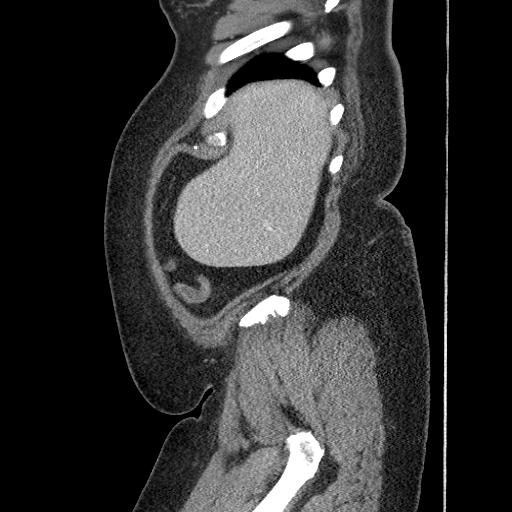
[im 59/177  soft-tissue]
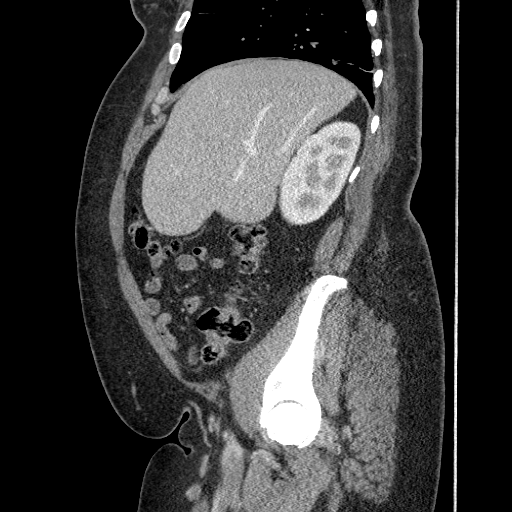
[im 79/177  soft-tissue]
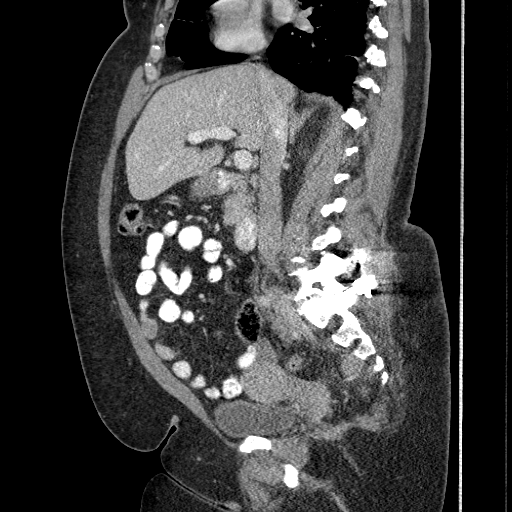
[im 98/177  soft-tissue]
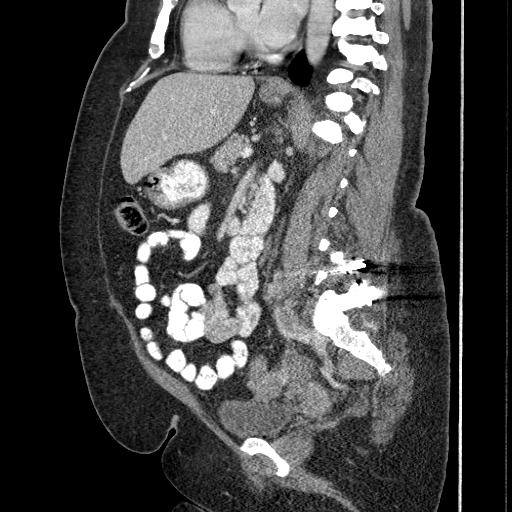
[im 118/177  soft-tissue]
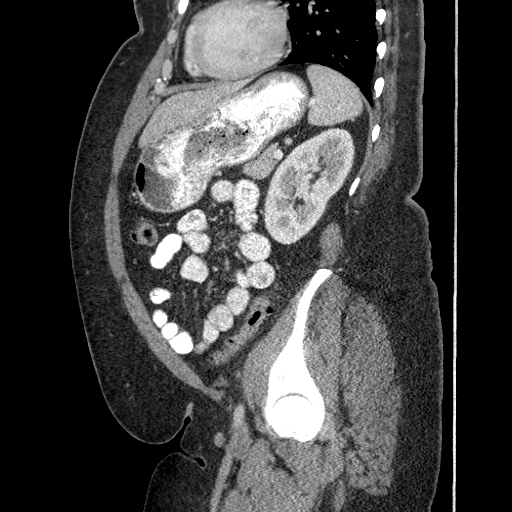

[13 of 36 positions shown; findings below may reference images not displayed]

FINDINGS: Status post surgical posterior fusion of L5-S1. Visualized lung
bases appear normal.

Status post cholecystectomy. The liver, spleen and pancreas appear
normal. Adrenal glands and kidneys are unremarkable. No
hydronephrosis or renal obstruction is noted. The appendix appears
normal. There is no evidence of bowel obstruction. No abnormal fluid
collection is noted. Urinary bladder and uterus appear normal.
Ovaries are unremarkable. No significant adenopathy is noted.
IMPRESSION: No significant abnormality seen in the abdomen or pelvis.

## 2017-05-14 ENCOUNTER — Ambulatory Visit (INDEPENDENT_AMBULATORY_CARE_PROVIDER_SITE_OTHER): Payer: Medicaid Other | Admitting: Adult Health

## 2017-05-14 ENCOUNTER — Encounter: Payer: Self-pay | Admitting: Adult Health

## 2017-05-14 DIAGNOSIS — E669 Obesity, unspecified: Secondary | ICD-10-CM

## 2017-05-14 DIAGNOSIS — G4733 Obstructive sleep apnea (adult) (pediatric): Secondary | ICD-10-CM | POA: Diagnosis not present

## 2017-05-14 NOTE — Assessment & Plan Note (Signed)
Poor compliance   Plan  Patient Instructions  Set up for CPAP titration study .  Try to wear CPAP for at least 4 hr each night  Do not drive if sleepy.  Work on weight loss.  Try to quit smoking  Follow up with Dr. [name]  In 3 months and As needed   Please contact office for sooner follow up if symptoms do not improve or worsen or seek emergency care

## 2017-05-14 NOTE — Progress Notes (Signed)
@Patient  ID: Lori Bean, female    DOB: April 27, 1971, 46 y.o.   MRN: [id number]  Chief Complaint  Patient presents with  . Follow-up    OSA    Referring provider: [name], [place]  HPI: 46 yo female former smoker  seen for sleep evaluation and dyspnea  09/23/16 for evaluation of sleep apnea.  PMH of HTN heart dz, Diastolic CHF , Bipolar and Schizophrenia   TEST  06/2016 sleep study AHI 26/hr   05/14/2017 Follow up : OSA  Patient presents for a six-month follow-up for sleep apnea. Patient has known moderate sleep apnea diagnosed on sleep study in October 2017. She is supposed to be using C Pap at night. We changed her mask last ov but says the mask is okay now but does like the pressure settings. We discussed CPAP titration study .    Allergies  Allergen Reactions  . Aspirin Nausea And Vomiting    Immunization History  Administered Date(s) Administered  . Influenza Split 06/07/2013, 06/15/2015  . Pneumococcal Polysaccharide-23 04/03/2014  . Rabies, IM 05/04/2016, 05/12/2016    Past Medical History:  Diagnosis Date  . Anginal pain ([place])    occ from asthma  . Anxiety   . Arthritis   . Asthma   . Bipolar affective disorder ([place])    Schizophrenia  . Bronchitis    hx of  . CHF (congestive heart failure) ([place])   . Depression   . GERD (gastroesophageal reflux disease)   . Heart murmur   . Hypertension   . Neuropathy    left leg , "from back surgery"  . Overactive bladder   . Schizophrenia ([place])   . Sciatica   . Shortness of breath   . Snoring disorder    Pt stated my boyfriend always wakes me up and tells me to breathe    Tobacco History: History  Smoking Status  . Current Every Day Smoker  . Packs/day: 0.50  . Years: 24.00  . Types: Cigarettes  . Last attempt to quit: 05/23/2014  Smokeless Tobacco  . Never Used   Ready to quit: Not Answered Counseling given: Not Answered   Outpatient Encounter Prescriptions as of 05/14/2017  Medication  Sig  . albuterol (PROAIR HFA) 108 (90 Base) MCG/ACT inhaler INHALE TWO PUFFS INTO THE LUNGS EVERY SIX HOURS AS NEEDED FOR WHEEZING OR SHORTNESS OF BREATH (Patient taking differently: Inhale 2 puffs into the lungs every 6 (six) hours as needed for wheezing or shortness of breath. )  . albuterol (PROVENTIL) (2.5 MG/3ML) 0.083% nebulizer solution USE 1 VIAL VIA NEBULIZER Q 6 H  . amLODipine (NORVASC) 5 MG tablet Take 1 tablet (5 mg total) by mouth daily.  . ARIPiprazole (ABILIFY) 20 MG tablet Take 1 tablet (20 mg total) by mouth at bedtime. (Patient taking differently: Take 20 mg by mouth 2 (two) times daily. )  . Beclomethasone Diprop HFA (QVAR REDIHALER) 80 MCG/ACT AERB Inhale 2 puffs into the lungs 2 (two) times daily.  [name] DIOVAN 320 MG tablet Take 1 tablet (320 mg total) by mouth daily.  . DULoxetine (CYMBALTA) 30 MG capsule Take 1 capsule by mouth 2 (two) times daily.  . furosemide (LASIX) 80 MG tablet Take 80 mg by mouth 2 (two) times daily.  [name] omeprazole (PRILOSEC) 20 MG capsule Take 1 capsule by mouth daily.  . Potassium Chloride ER 20 MEQ TBCR Take 20 mEq by mouth 2 (two) times daily.  . pregabalin (LYRICA) 150 MG capsule Take 150 mg  by mouth daily.  [name] tiZANidine (ZANAFLEX) 4 MG tablet Take 1 tablet by mouth every 8 (eight) hours as needed.  . triamcinolone cream (KENALOG) 0.1 % Apply 1 application topically 2 (two) times daily as needed (foot rash).  . ADVAIR DISKUS 250-50 MCG/DOSE AEPB Inhale 1 puff into the lungs 2 (two) times daily.  . [DISCONTINUED] predniSONE (DELTASONE) 20 MG tablet Take 2 tablets (40 mg total) by mouth daily with breakfast. (Patient not taking: Reported on 05/14/2017)  . [DISCONTINUED] SYMBICORT 160-4.5 MCG/ACT inhaler Inhale 2 puffs into the lungs 2 (two) times daily.   No facility-administered encounter medications on file as of 05/14/2017.      Review of Systems  Constitutional:   No  weight loss, night sweats,  Fevers, chills +, fatigue, or   lassitude.  HEENT:   No headaches,  Difficulty swallowing,  Tooth/dental problems, or  Sore throat,                No sneezing, itching, ear ache, nasal congestion, post nasal drip,   CV:  No chest pain,  Orthopnea, PND, swelling in lower extremities, anasarca, dizziness, palpitations, syncope.   GI  No heartburn, indigestion, abdominal pain, nausea, vomiting, diarrhea, change in bowel habits, loss of appetite, bloody stools.   Resp: No shortness of breath with exertion or at rest.  No excess mucus, no productive cough,  No non-productive cough,  No coughing up of blood.  No change in color of mucus.  No wheezing.  No chest wall deformity  Skin: no rash or lesions.  GU: no dysuria, change in color of urine, no urgency or frequency.  No flank pain, no hematuria   MS:  No joint pain or swelling.  No decreased range of motion.  No back pain.    Physical Exam  BP 112/66 (BP Location: Left Arm, Cuff Size: Large)   Pulse 90   Ht 5\' 2"  (1.575 m)   Wt 250 lb 12.8 oz (113.8 kg)   LMP 04/25/2017   SpO2 98%   BMI 45.87 kg/m   GEN: A/Ox3; pleasant , NAD, obese    HEENT:  [place]/AT,  EACs-clear, TMs-wnl, NOSE-clear, THROAT-clear, no lesions, no postnasal drip or exudate noted. Class 3 MP airway   NECK:  Supple w/ fair ROM; no JVD; normal carotid impulses w/o bruits; no thyromegaly or nodules palpated; no lymphadenopathy.    RESP  Clear  P & A; w/o, wheezes/ rales/ or rhonchi. no accessory muscle use, no dullness to percussion  CARD:  RRR, no m/r/g, no peripheral edema, pulses intact, no cyanosis or clubbing.  GI:   Soft & nt; nml bowel sounds; no organomegaly or masses detected.   Musco: Warm bil, no deformities or joint swelling noted.   Neuro: alert, no focal deficits noted.    Skin: Warm, no lesions or rashes    Lab Results:  CBC  ProBNP  Imaging:    Assessment & Plan:   OSA (obstructive sleep apnea) Poor compliance   Plan  Patient Instructions  Set up for CPAP  titration study .  Try to wear CPAP for at least 4 hr each night  Do not drive if sleepy.  Work on weight loss.  Try to quit smoking  Follow up with Dr. [name]  In 3 months and As needed   Please contact office for sooner follow up if symptoms do not improve or worsen or seek emergency care      Obesity (BMI 30-39.9) Wt loss .  Rexene Edison, NP 05/14/2017

## 2017-05-14 NOTE — Patient Instructions (Signed)
Set up for CPAP titration study .  Try to wear CPAP for at least 4 hr each night  Do not drive if sleepy.  Work on weight loss.  Try to quit smoking  Follow up with Dr. [name]  In 3 months and As needed   Please contact office for sooner follow up if symptoms do not improve or worsen or seek emergency care

## 2017-05-14 NOTE — Assessment & Plan Note (Signed)
Wt loss  

## 2017-05-14 NOTE — Addendum Note (Signed)
Addended by: Parke Poisson E on: 05/14/2017 03:04 PM   Modules accepted: Orders

## 2017-05-24 ENCOUNTER — Encounter: Payer: Self-pay | Admitting: Vascular Surgery

## 2017-05-25 IMAGING — CR DG LUMBAR SPINE COMPLETE 4+V
5 series · 5 of 5 positions shown · non-contrast
Comparison: 04/17/2015

CLINICAL DATA: Per nurse notes: Pt reports falling on [REDACTED], was
stepping out of an RV and step broke. Pt twisted left ankle and
landed on left side. Having pain to neck, lower back pain, left leg
and hip.

EXAM:
LUMBAR SPINE - COMPLETE 4+ VIEW

[l-spine ap]
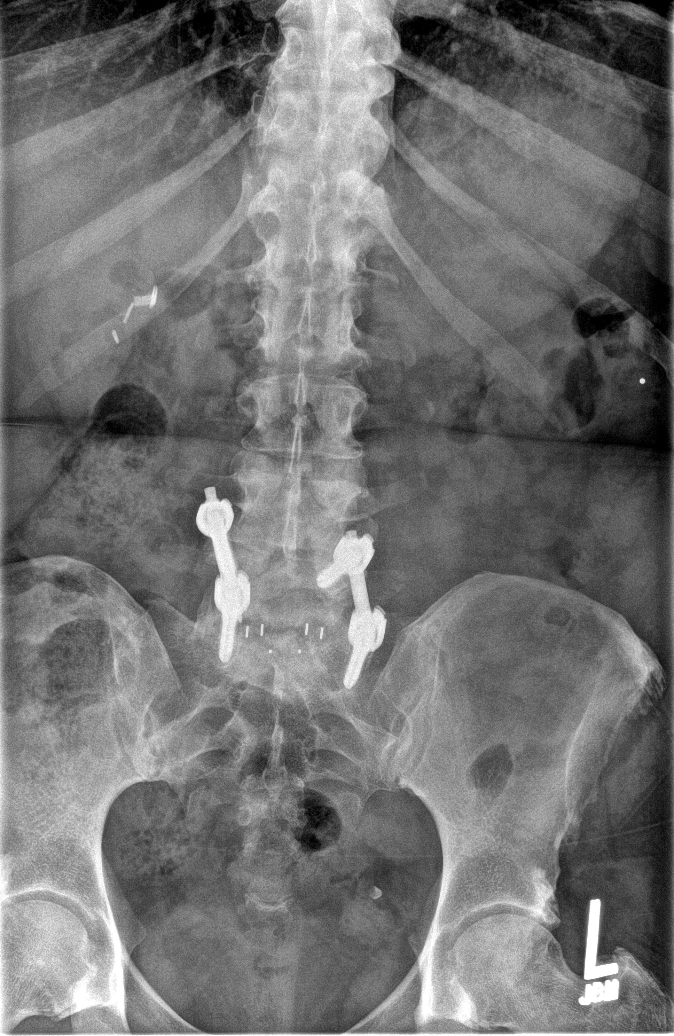

[l-spine obl (1 of 2)]
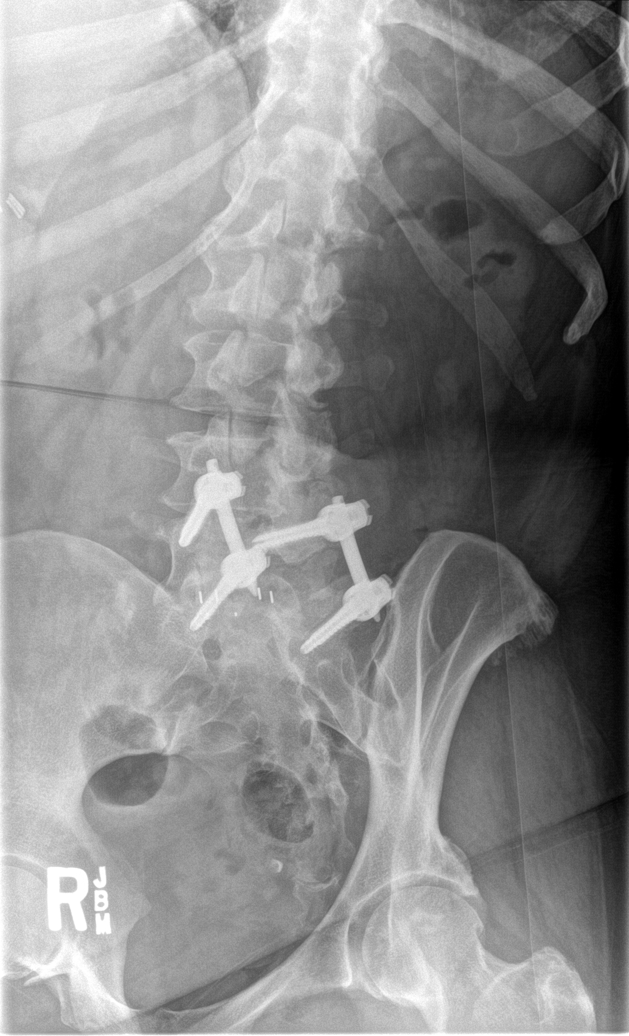

[l-spine obl (2 of 2)]
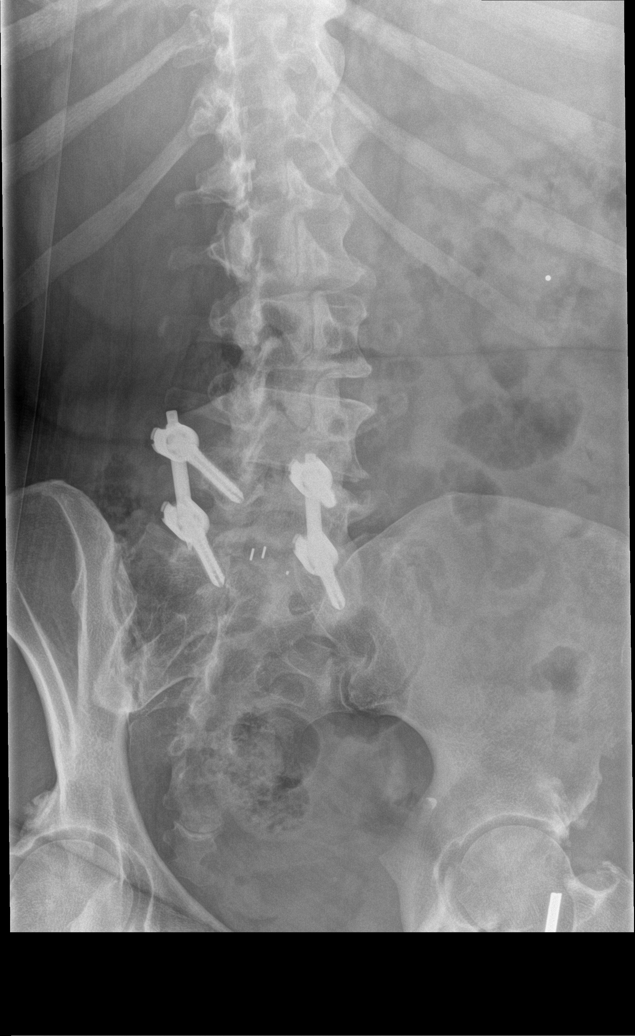

[l-spine lat]
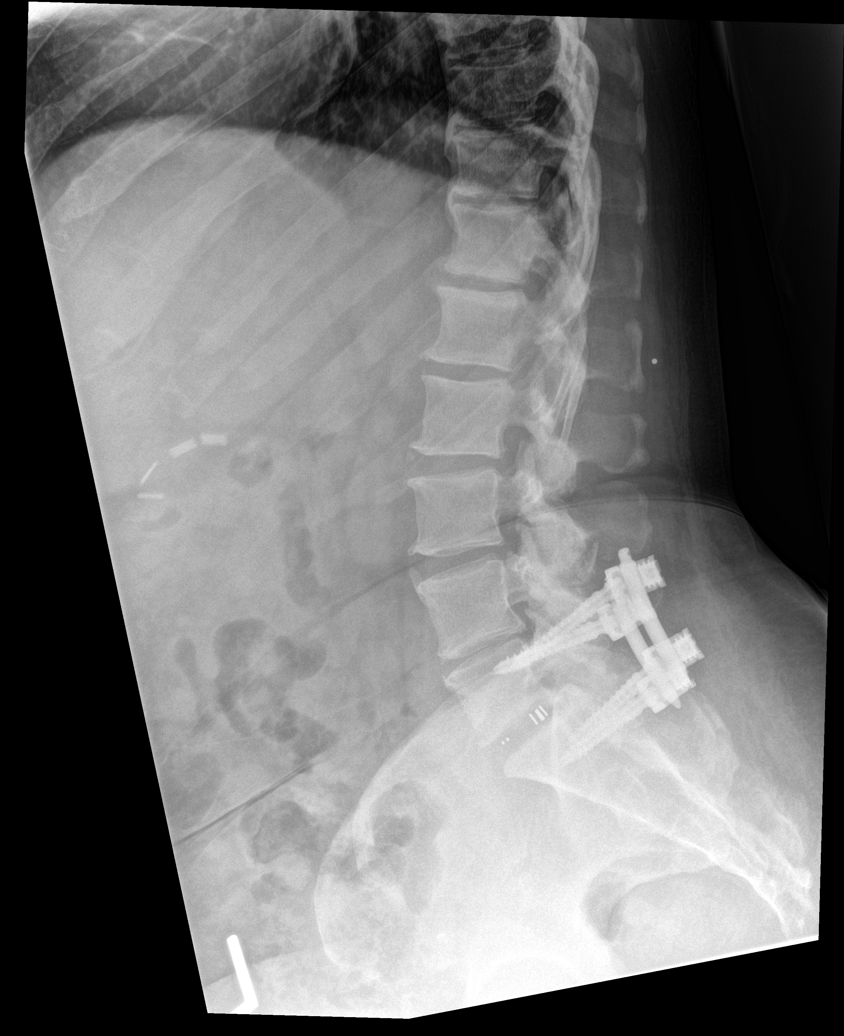

[l-spine spot]
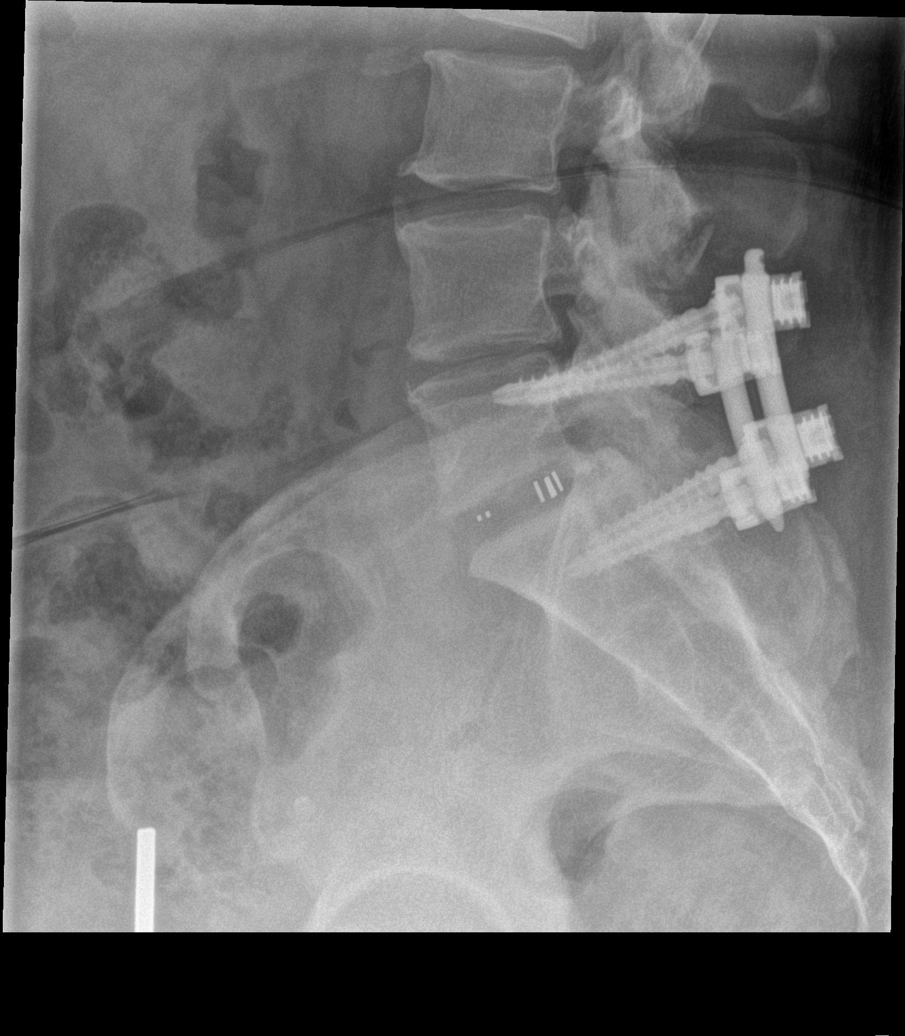

[5 of 5 positions shown; findings below may reference images not displayed]

FINDINGS: No acute fracture. Bilateral pedicle screws and interconnecting rods
diffuse L5-S1. There is a radiolucent disc spacer well-centered
within the L5-S1 disc space. The orthopedic hardware well-seated
with no evidence of loosening

Mild loss disc height from the lower thoracic spine through L3-L4.
Moderate loss disc height at L4-L5. Small endplate osteophytes noted
throughout the lumbar spine
IMPRESSION: 1. No fracture or acute finding.
2. Stable fusion hardware at L5-S1.
3. Stable disc degenerative changes.

## 2017-05-25 IMAGING — CR DG RIBS W/ CHEST 3+V*L*
3 series · 3 of 3 positions shown · non-contrast
Comparison: Chest radiographs 11/18/2014

CLINICAL DATA: Fell on [REDACTED] stepping out of an RV when the step
broke, twisted LEFT ankle and landed on LEFT side, LEFT leg and hip
pain, lower back pain, neck pain

EXAM:
LEFT RIBS AND CHEST - 3+ VIEW

[chest pa]
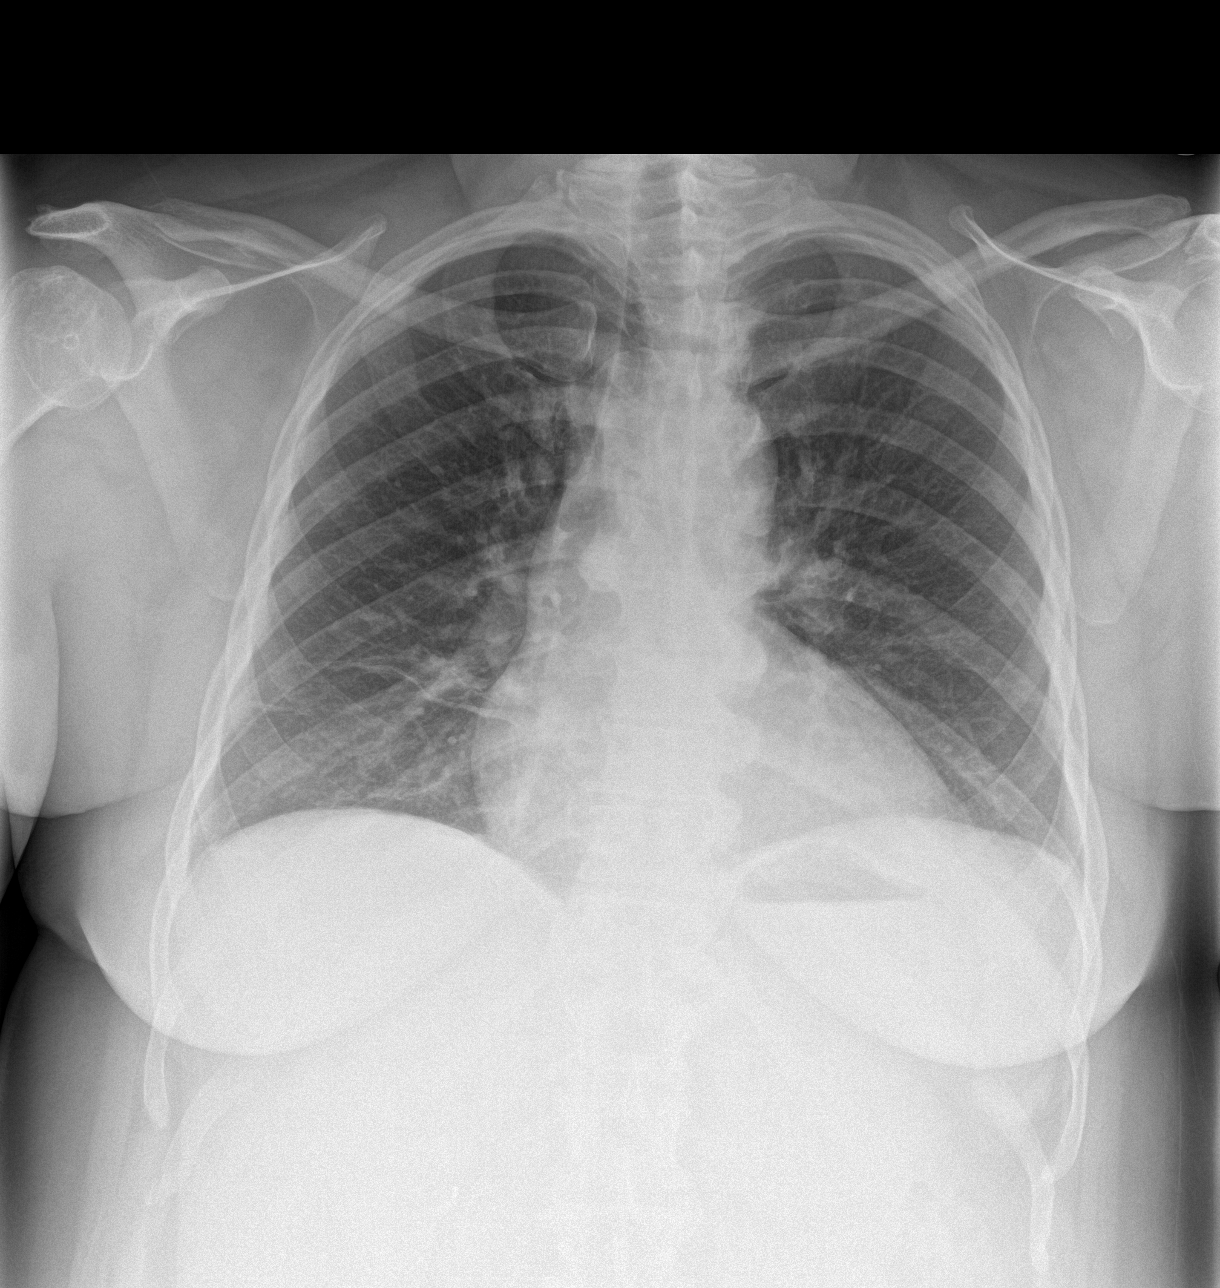

[rib ap]
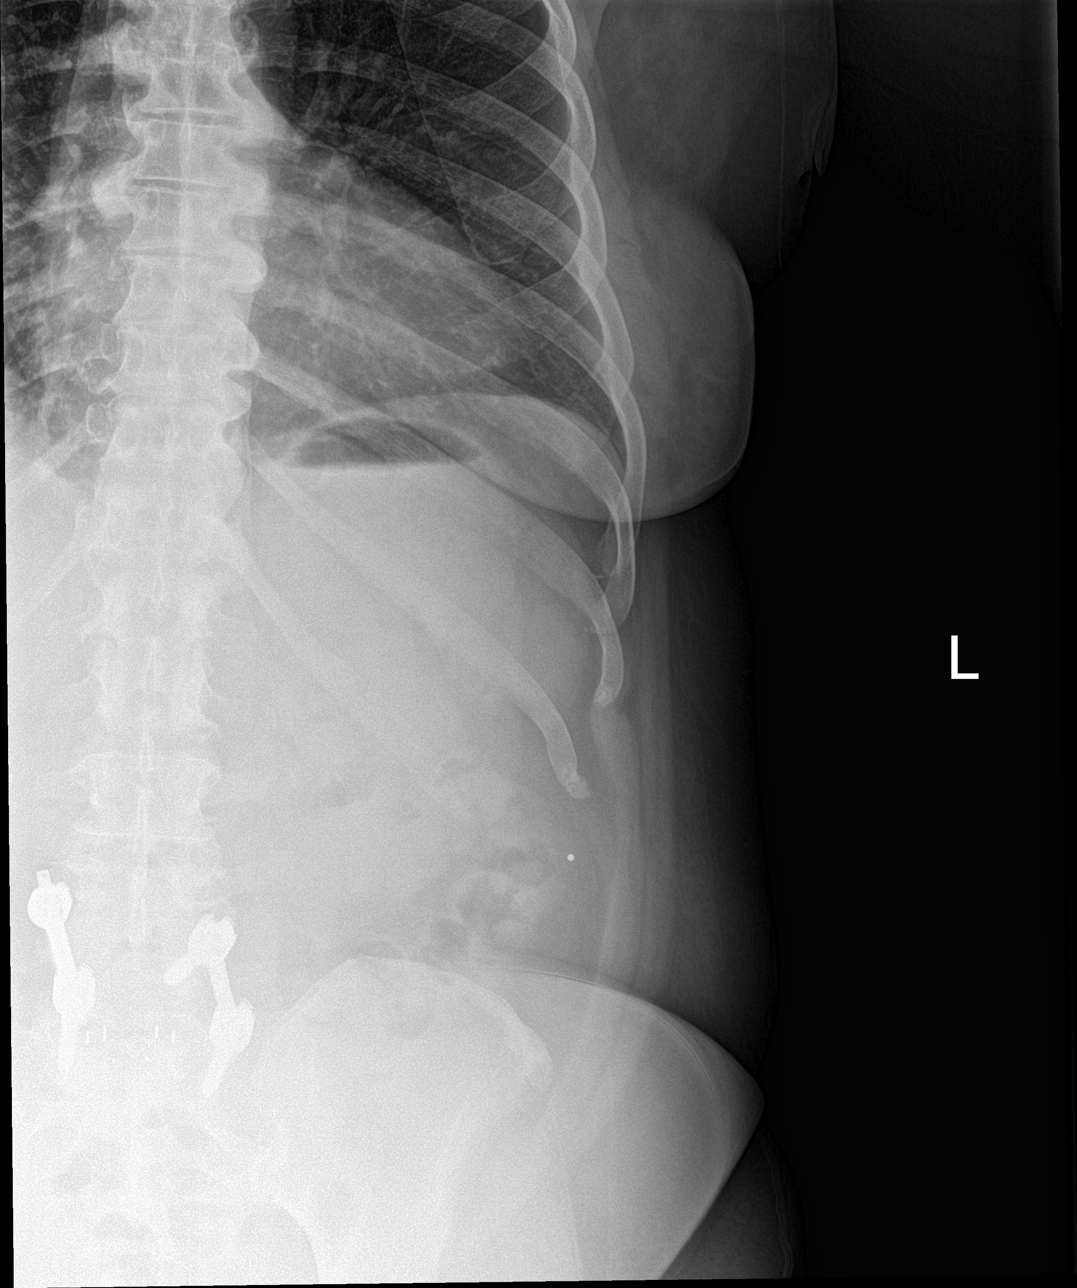

[rib ap obl]
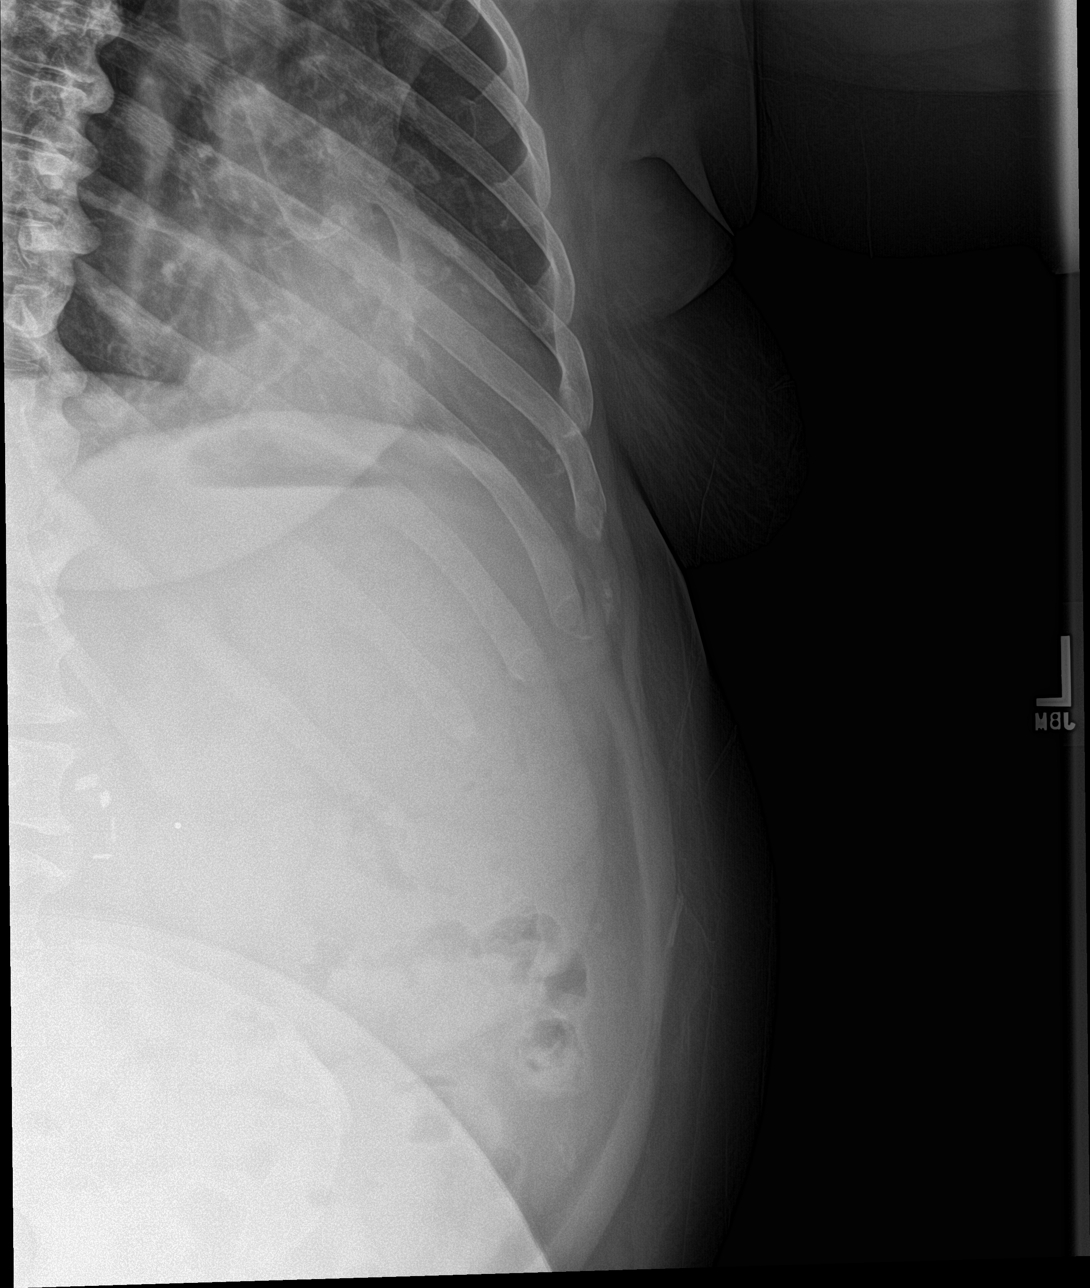

[3 of 3 positions shown; findings below may reference images not displayed]

FINDINGS: Upper normal heart size.

Mediastinal contours and pulmonary vascularity normal.

Subsegmental atelectasis at RIGHT base.

Lungs otherwise clear.

No pleural effusion or pneumothorax.

Osseous mineralization grossly normal.

Scattered degenerative disc disease changes with endplate spur
formation and thoracic spine.

BB placed at site of symptoms at lower LEFT ribs.

No rib fracture or bone destruction seen.
IMPRESSION: Subsegmental atelectasis RIGHT lung base.

No definite acute rib abnormalities.

## 2017-05-26 ENCOUNTER — Encounter: Payer: Self-pay | Admitting: Vascular Surgery

## 2017-05-26 ENCOUNTER — Ambulatory Visit (HOSPITAL_COMMUNITY)
Admission: RE | Admit: 2017-05-26 | Discharge: 2017-05-26 | Disposition: A | Discharge status: Home / Self Care | Payer: Medicaid Other | Source: Ambulatory Visit | Attending: Vascular Surgery | Admitting: Vascular Surgery

## 2017-05-26 ENCOUNTER — Ambulatory Visit (INDEPENDENT_AMBULATORY_CARE_PROVIDER_SITE_OTHER): Payer: Medicaid Other | Admitting: Vascular Surgery

## 2017-05-26 VITALS — BP 140/87 | HR 87 | Temp 97.8°F | Resp 16

## 2017-05-26 DIAGNOSIS — I8393 Asymptomatic varicose veins of bilateral lower extremities: Secondary | ICD-10-CM | POA: Insufficient documentation

## 2017-05-26 DIAGNOSIS — R609 Edema, unspecified: Secondary | ICD-10-CM

## 2017-05-26 DIAGNOSIS — R600 Localized edema: Secondary | ICD-10-CM

## 2017-05-26 NOTE — Progress Notes (Signed)
Patient ID: Lori Bean, female   DOB: 04-05-1971, 46 y.o.   MRN: [id number]  Reason for Consult: New Patient (Initial Visit) (bilateral lower extremity swelling)   Referred by Benito Mccreedy, MD  Subjective:     HPI:  Lori Bean is a 46 y.o. female without significant vascular history but does have a history of a left lower extremity DVT for which she was treated with blood thinners. She now presents with bilateral lower extremity swelling that she states has been going on for a few years. She does not have any tissue loss or ulceration associated with this. She also states this is associated swelling in her bilateral upper extremities. She is placed on Lasix although she says this has not helped. She has never worn compression stockings. She does not walk much is limited by shortness of breath as well as leg swelling. She has not had anything that has worked.  Past Medical History:  Diagnosis Date  . Anginal pain ([place])    occ from asthma  . Anxiety   . Arthritis   . Asthma   . Bipolar affective disorder ([place])    Schizophrenia  . Bronchitis    hx of  . CHF (congestive heart failure) ([place])   . Depression   . GERD (gastroesophageal reflux disease)   . Heart murmur   . Hypertension   . Neuropathy    left leg , "from back surgery"  . Overactive bladder   . Schizophrenia ([place])   . Sciatica   . Shortness of breath   . Snoring disorder    Pt stated my boyfriend always wakes me up and tells me to breathe   Family History  Problem Relation Age of Onset  . Diabetes Mother   . Hypertension Mother   . Diabetes Father   . Heart disease Paternal Aunt   . Cancer Paternal Aunt    Past Surgical History:  Procedure Laterality Date  . BACK SURGERY     3 back surgeries  . CHOLECYSTECTOMY    . COLONOSCOPY WITH PROPOFOL N/A 01/23/2016   Procedure: COLONOSCOPY WITH PROPOFOL;  Surgeon: Wonda Horner, MD;  Location: [facility];  Service: Endoscopy;  Laterality:  N/A;  . EYE SURGERY     Metal plate in right eye. Had fracture in right eye  . gallstones reomved    . KNEE ARTHROSCOPY WITH MENISCAL REPAIR Right 09/28/2016   Procedure: KNEE ARTHROSCOPY WITH MENISCAL REPAIR;  Surgeon: Dorna Leitz, MD;  Location: [facility];  Service: Orthopedics;  Laterality: Right;  Right partial meniscectomy and chondroplasty, patellar/femoral joint and medial femoral condyle   . LUMBAR LAMINECTOMY/DECOMPRESSION MICRODISCECTOMY  04/01/2012   Procedure: LUMBAR LAMINECTOMY/DECOMPRESSION MICRODISCECTOMY 2 LEVELS;  Surgeon: Faythe Ghee, MD;  Location: [facility];  Service: Neurosurgery;  Laterality: Left;  Lumbar four-five, lumbar five sacral one microdiscectomy   . LUMBAR WOUND DEBRIDEMENT  04/29/2012   Procedure: LUMBAR WOUND DEBRIDEMENT;  Surgeon: Faythe Ghee, MD;  Location: [facility];  Service: Neurosurgery;  Laterality: N/A;  lumbar wound debridement  . ROTATOR CUFF REPAIR     Right shoulder  . TUBAL LIGATION      Short Social History:  Social History  Substance Use Topics  . Smoking status: Current Every Day Smoker    Packs/day: 0.50    Years: 24.00    Types: Cigarettes    Last attempt to quit: 05/23/2014  . Smokeless tobacco: Never Used  . Alcohol use No  Comment: quit Nov. 2014    Allergies  Allergen Reactions  . Aspirin Nausea And Vomiting    Current Outpatient Prescriptions  Medication Sig Dispense Refill  . ADVAIR DISKUS 250-50 MCG/DOSE AEPB Inhale 1 puff into the lungs 2 (two) times daily.  0  . albuterol (PROAIR HFA) 108 (90 Base) MCG/ACT inhaler INHALE TWO PUFFS INTO THE LUNGS EVERY SIX HOURS AS NEEDED FOR WHEEZING OR SHORTNESS OF BREATH (Patient taking differently: Inhale 2 puffs into the lungs every 6 (six) hours as needed for wheezing or shortness of breath. ) 8.5 g 3  . albuterol (PROVENTIL) (2.5 MG/3ML) 0.083% nebulizer solution USE 1 VIAL VIA NEBULIZER Q 6 H  2  . amLODipine (NORVASC) 5 MG tablet Take 1 tablet (5 mg total) by mouth  daily. 30 tablet 3  . ARIPiprazole (ABILIFY) 20 MG tablet Take 1 tablet (20 mg total) by mouth at bedtime. (Patient taking differently: Take 20 mg by mouth 2 (two) times daily. ) 30 tablet 0  . Beclomethasone Diprop HFA (QVAR REDIHALER) 80 MCG/ACT AERB Inhale 2 puffs into the lungs 2 (two) times daily. 1 Inhaler 3  . DIOVAN 320 MG tablet Take 1 tablet (320 mg total) by mouth daily. 30 tablet 5  . DULoxetine (CYMBALTA) 30 MG capsule Take 1 capsule by mouth 2 (two) times daily.  3  . EUCRISA 2 % OINT APP EXT AA BID  0  . furosemide (LASIX) 80 MG tablet Take 80 mg by mouth 2 (two) times daily.    . hydrOXYzine (ATARAX/VISTARIL) 25 MG tablet TK 1 T PO  TID  0  . omeprazole (PRILOSEC) 20 MG capsule Take 1 capsule by mouth daily.  3  . Potassium Chloride ER 20 MEQ TBCR Take 20 mEq by mouth 2 (two) times daily. 60 tablet 0  . pregabalin (LYRICA) 150 MG capsule Take 150 mg by mouth daily.    . SYMBICORT 160-4.5 MCG/ACT inhaler INHALE 2 PUFFS BID  3  . tiZANidine (ZANAFLEX) 4 MG tablet Take 1 tablet by mouth every 8 (eight) hours as needed.  1  . triamcinolone cream (KENALOG) 0.1 % Apply 1 application topically 2 (two) times daily as needed (foot rash). 30 g 2   No current facility-administered medications for this visit.     Review of Systems  Constitutional:  Constitutional negative. HENT: HENT negative.  Eyes: Eyes negative.  Respiratory: Positive for shortness of breath.  Cardiovascular: Positive for leg swelling and orthopnea.  GI: Gastrointestinal negative.  Musculoskeletal: Positive for leg pain and joint pain.  Skin: Positive for rash.  Neurological: Neurological negative. Hematologic: Hematologic/lymphatic negative.        Objective:  Objective   Vitals:   05/26/17 1041  BP: 140/87  Pulse: 87  Resp: 16  Temp: 97.8 F (36.6 C)  SpO2: 100%   There is no height or weight on file to calculate BMI.  Physical Exam  Constitutional: She is oriented to person, place, and time.  She appears well-developed.  HENT:  Head: Normocephalic.  Eyes: Pupils are equal, round, and reactive to light.  Neck: Normal range of motion. Neck supple. No thyromegaly present.  Cardiovascular: Normal rate.   Bilateral feet have brisk cap refill  Abdominal: Soft.  Musculoskeletal: She exhibits edema.  Neurological: She is alert and oriented to person, place, and time.  Skin: Skin is dry. Rash noted.  Psychiatric: She has a normal mood and affect. Her behavior is normal. Judgment and thought content normal.    Data: I've  independently interpreted her bilateral lower extremity venous duplexes to have common femoral vein reflux but no associated saphenous vein reflux. There is reflux at the left saphenofemoral junction only.     Assessment/Plan:     46 year old female with history of bilateral lower extremity swelling and previous left leg DVT. She has minimal reflux on her exam today and I think that her swelling is multifactorial including from her CHF and especially from her weight. We have discussed walking as well as compression stockings and I have given her information regarding elastic therapies in [place]. We have also discussed possible water aerobics. I have also given her information regarding [place] surgery in the bariatric program to discuss with her primary care physician. She demonstrated very good understanding and agrees to wear compression stockings and walk and hopefully will consider weight loss surgery in the future should her heart tolerate. She can follow-up on a when necessary basis.     Waynetta Sandy MD [facility]

## 2017-05-27 NOTE — Addendum Note (Signed)
Addended by: Leland Johns A on: 05/27/2017 03:35 PM   Modules accepted: Orders

## 2017-06-04 ENCOUNTER — Telehealth: Payer: Self-pay | Admitting: Internal Medicine

## 2017-06-04 ENCOUNTER — Ambulatory Visit (INDEPENDENT_AMBULATORY_CARE_PROVIDER_SITE_OTHER): Payer: Medicaid Other | Admitting: Internal Medicine

## 2017-06-04 ENCOUNTER — Encounter: Payer: Self-pay | Admitting: Internal Medicine

## 2017-06-04 ENCOUNTER — Ambulatory Visit (INDEPENDENT_AMBULATORY_CARE_PROVIDER_SITE_OTHER)
Admission: RE | Admit: 2017-06-04 | Discharge: 2017-06-04 | Disposition: A | Discharge status: Home / Self Care | Payer: Medicaid Other | Source: Ambulatory Visit | Attending: Internal Medicine | Admitting: Internal Medicine

## 2017-06-04 VITALS — BP 116/72 | HR 90 | Ht 62.0 in | Wt 250.0 lb

## 2017-06-04 DIAGNOSIS — R0600 Dyspnea, unspecified: Secondary | ICD-10-CM

## 2017-06-04 DIAGNOSIS — J4530 Mild persistent asthma, uncomplicated: Secondary | ICD-10-CM | POA: Diagnosis not present

## 2017-06-04 DIAGNOSIS — R0609 Other forms of dyspnea: Secondary | ICD-10-CM

## 2017-06-04 MED ORDER — FAMOTIDINE 20 MG PO TABS: ORAL_TABLET | ORAL | 11 refills | Status: DC

## 2017-06-04 MED ORDER — PANTOPRAZOLE SODIUM 40 MG PO TBEC: 40.0000 mg | DELAYED_RELEASE_TABLET | Freq: Every day | ORAL | 11 refills | Status: DC

## 2017-06-04 MED ORDER — BUDESONIDE-FORMOTEROL FUMARATE 80-4.5 MCG/ACT IN AERO: 2.0000 | INHALATION_SPRAY | Freq: Two times a day (BID) | RESPIRATORY_TRACT | 11 refills | Status: DC

## 2017-06-04 MED ORDER — BUDESONIDE-FORMOTEROL FUMARATE 80-4.5 MCG/ACT IN AERO: 2.0000 | INHALATION_SPRAY | Freq: Two times a day (BID) | RESPIRATORY_TRACT | 0 refills | Status: DC

## 2017-06-04 NOTE — Assessment & Plan Note (Addendum)
See pfts 11/10/16 c/w mild asthma   Symptoms are markedly disproportionate to objective findings and not clear this is actually much of a  lung problem but pt does appear to have difficult to sort out respiratory symptoms of unknown origin for which  DDX  = almost all start with A and  include Adherence, Ace Inhibitors, Acid Reflux, Active Sinus Disease, Alpha 1 Antitripsin deficiency, Anxiety masquerading as Airways dz,  ABPA,  Allergy(esp in young), Aspiration (esp in elderly), Adverse effects of meds,  Active smokers, A bunch of PE's (a small clot burden can't cause this syndrome unless there is already severe underlying pulm or vascular dz with poor reserve) plus two Bs  = Bronchiectasis and Beta blocker use..and one C= CHF     Adherence is always the initial "prime suspect" and is a multilayered concern that requires a "trust but verify" approach in every patient - starting with knowing how to use medications, especially inhalers, correctly, keeping up with refills and understanding the fundamental difference between maintenance and prns vs those medications only taken for a very short course and then stopped and not refilled.  - see hfa teaching - return with all meds in hand using a trust but verify approach to confirm accurate Medication  Reconciliation The principal here is that until we are certain that the  patients are doing what we've asked, it makes no sense to ask them to do more.    ? Acid (or non-acid) GERD > always difficult to exclude as up to 75% of pts in some series report no assoc GI/ Heartburn symptoms> rec max (24h)  acid suppression and diet restrictions/ reviewed and instructions given in writing.   ? Allergy/ asthma > may do bette on less symb = 80 2bid   ? Active sinus Dz > neg sinus CT 05/06/17   ? Anxiety/obesity/ deconditioning  > usually at the bottom of this list of usual suspects but should be much higher on this pt's based on H and P and note already on  psychotropics   ? Active smoking > denies/ reinforced importance   ? A bunch of pE's > d dimer's neg x 2 > D dimer nl - while  A nl valute  may miss small peripheral pe, the clot burden with sob is moderately high and the d dimer has a very high neg pred value in this setting    ? chf >  She does have diastolic dysfunction but last bnp < 100    Will start rx max for gerd then return for med reconciliation   To keep things simple, I have asked the patient to first separate medicines that are perceived as maintenance, that is to be taken daily "no matter what", from those medicines that are taken on only on an as-needed basis and I have given the patient examples of both, and then return to see our NP to generate a  detailed  medication calendar which should be followed until the next physician sees the patient and updates it.    Total time devoted to counseling  > 50 % of initial 60 min office visit:  review case with pt/ discussion of options/alternatives/ personally creating written customized instructions  in presence of pt  then going over those specific  Instructions directly with the pt including how to use all of the meds but in particular covering each new medication in detail and the difference between the maintenance= "automatic" meds and the prns using an action  plan format for the latter (If this problem/symptom => do that organization reading Left to right).  Please see AVS from this visit for a full list of these instructions which I personally wrote for this pt and  are unique to this visit.

## 2017-06-04 NOTE — Telephone Encounter (Signed)
Notes recorded by Tanda Rockers, MD on 06/04/2017 at 2:58 PM EDT Call pt: Reviewed cxr and no acute change so no change in recommendations made at [facility] with pt and advised results. Pt verbalized understanding and nothing further is needed.

## 2017-06-04 NOTE — Patient Instructions (Addendum)
Plan A = Automatic = symbicort 80 Take 2 puffs first thing in am and then another 2 puffs about 12 hours later.    Pantoprazole (protonix) 40 mg (or prilosec 20 x 2)    Take  30-60 min before first meal of the day and Pepcid (famotidine)  20 mg one @  bedtime until return to office - this is the best way to tell whether stomach acid is contributing to your problem.    Plan B = Backup Only use your albuterol as a rescue medication to be used if you can't catch your breath by resting or doing a relaxed purse lip breathing pattern.  - The less you use it, the better it will work when you need it. - Ok to use the inhaler up to 2 puffs  every 4 hours if you must but call for appointment if use goes up over your usual need - Don't leave home without it !!  (think of it like the spare tire for your car)   Plan C = Nebulizer    GERD (REFLUX)  is an extremely common cause of respiratory symptoms just like yours , many times with no obvious heartburn at all.    It can be treated with medication, but also with lifestyle changes including elevation of the head of your bed (ideally with 6 inch  bed blocks),  Smoking cessation, avoidance of late meals, excessive alcohol, and avoid fatty foods, chocolate, peppermint, colas, red wine, and acidic juices such as orange juice.  NO MINT OR MENTHOL PRODUCTS SO NO COUGH DROPS   USE SUGARLESS CANDY INSTEAD (Jolley ranchers or Stover's or Life Savers) or even ice chips will also do - the key is to swallow to prevent all throat clearing. NO OIL BASED VITAMINS - use powdered substitutes.   See [name] NP w/in 2 weeks or next availale  with all your medications, even over the counter meds, separated in two separate bags, the ones you take no matter what vs the ones you stop once you feel better and take only as needed when you feel you need them.   [name]  will generate for you a new user friendly medication calendar that will put [place] all on the same page re: your medication  use.     Without this process, it simply isn't possible to assure that we are providing  your outpatient care  with  the attention to detail we feel you deserve.   If we cannot assure that you're getting that kind of care,  then we cannot manage your problem effectively from this clinic.  Once you have seen [name] and we are sure that we're all on the same page with your medication use she will arrange follow up with me.    Please remember to go to the  x-ray department downstairs in the basement  for your tests - we will call you with the results when they are available.

## 2017-06-04 NOTE — Progress Notes (Signed)
Subjective:     Patient ID: Lori Bean, female   DOB: Nov 11, 1970      MRN: [id number]  HPI  71 yobf MO quit smoking 04/2017 healthy child/ 2 IUP's  With baseline wt < 200 with new doe x 2012 assoc with wt gain gain x 2016 referred to pulmonary clinic 06/04/2017 by Dr   Emilee Hero bonsu   Admit date: 12/17/2016 Discharge date: 12/18/2016  Discharge Diagnoses:  Possible asthma exacerbation Essential hypertension Chronic diastolic heart failure Prediabetes Morbid obesity  History of present illness:  46 y.o.femalewith medical history significant of Asthma and OSA (reversibility demonstrated on PFTs in Feb this year), smoking, HTN, CHF grade 2 diastolic dysfunction. Patient presents to the ED with c/o SOB. Symptoms onset at rest yesterday, also has 10lb weight gain over past 3 weeks but states she is compliant with lasix 80 BID. 2 albuterol nebs before seeing PCP this AM. PCP sent her to ED for evaluation.  Hospital Course:  Possible asthma exacerbation -Patient states that her wheezing has actually improved and she is feeling better today -She recently finished a course of prednisone back in February 2018 -She has been following up with the pulmonary clinic -Chest x-ray -Spoke with Dr. [name], patient's pulmonologist. Of note, progress note from Dr. [name] back in January 2018 stated that he was not convinced of her asthma. At this time, Dr. [name] recommended no change in patient's medications. Follow-up as an outpatient. -Continue Advair Diskus, pro-air, QVAR -Patient was placed on prednisone 40 mg, will continue this for 4 days.  Essential hypertension -Continue amlodipine -Lasix was discontinued as she does complain of chronic cough  Chronic diastolic heart failure -Echocardiogram 05/24/2015 showed EF of 9323%, grade 2 diastolic dysfunction -Currently appears euvolemic and compensated on exam. -BNP 24.8 -Continue home Lasix dose with potassium  supplementation  Prediabetes -Hemoglobin A1c was 6.1 back in 2016. This can be followed and managed as an outpatient by patient's primary care physician.  Morbid obesity -BMI 47.1 -Patient only talk to her primary care physician regarding weight loss and lifestyle modifications. -Patient states she feels that she has gained 10-15 pounds in the past several weeks. States she has been watching what she eats. Admits that she has been using different scales at different doctors offices. Inquires about whether there is a program that offers free scales. Case management stated that this program is no longer available.     06/04/2017 1st   office visit/ [name]  Re transition of care  Chief Complaint  Patient presents with  . Pulmonary Consult    Referred by Dr. Vista Lawman. Pt c/o DOE for approx 6 years, worse over the past wk to the point that she is winded just walking from room to room at home. She states also having some prod cough with large amounts of black colored sputum.  She has CP "stabbing pain" comes and goes and lasts for several minutes. She has a rash on her arm- not improving after seeing derm. She has also noticed blurred vision. She is using her proair inhaler 3-4 x per day.   first started on proair helped some, then symbicort sev months prior to OV>  Not much response, even neb now doesn't always help. 6 years prior to OV  Trouble flat and x 6 months sleeping upright due to sob immediately on lying back Doe progressive   But esp over the last week to room to room now Coughing more since 2 months > black mucus assoc with  cp with coughing    No obvious day to day or daytime variability or assoc excess/ purulent sputum or mucus plugs or hemoptysis or cp or chest tightness, subjective wheeze or overt sinus or hb symptoms. No unusual exp hx or h/o childhood pna/ asthma or knowledge of premature birth.  Sleeping ok flat without nocturnal  or early am exacerbation  of respiratory  c/o's  or need for noct saba. Also denies any obvious fluctuation of symptoms with weather or environmental changes or other aggravating or alleviating factors except as outlined above   Current Allergies, Complete Past Medical History, Past Surgical History, Family History, and Social History were reviewed in [employer] record.  ROS  The following are not active complaints unless bolded sore throat, dysphagia, dental problems, itching, sneezing,  nasal congestion or disharge of excess mucus or purulent secretions, ear ache,   fever, chills, sweats, unintended wt loss or wt gain, classically pleuritic or exertional cp,  orthopnea pnd or leg swelling better on lasix , presyncope, palpitations, abdominal pain, anorexia, nausea, vomiting, diarrhea  or change in bowel habits or bladder habits, change in stools or change in urine, dysuria, hematuria,  Rash itchy R forearm , arthralgias, visual complaints, headache, numbness, weakness or ataxia or problems with walking or coordination,  change in mood/affect or memory.        Current Meds  - unable to verify this is accurate  Medication Sig  . albuterol (PROAIR HFA) 108 (90 Base) MCG/ACT inhaler INHALE TWO PUFFS INTO THE LUNGS EVERY SIX HOURS AS NEEDED FOR WHEEZING OR SHORTNESS OF BREATH (Patient taking differently: Inhale 2 puffs into the lungs every 6 (six) hours as needed for wheezing or shortness of breath. )  . albuterol (PROVENTIL) (2.5 MG/3ML) 0.083% nebulizer solution USE 1 VIAL VIA NEBULIZER Q 6 H  . amLODipine (NORVASC) 5 MG tablet Take 1 tablet (5 mg total) by mouth daily.  . ARIPiprazole (ABILIFY) 20 MG tablet Take 1 tablet (20 mg total) by mouth at bedtime. (Patient taking differently: Take 20 mg by mouth 2 (two) times daily. )  . DIOVAN 320 MG tablet Take 1 tablet (320 mg total) by mouth daily.  . EUCRISA 2 % OINT APP EXT AA BID  . furosemide (LASIX) 80 MG tablet Take 80 mg by mouth 2 (two) times daily.  . hydrOXYzine  (ATARAX/VISTARIL) 25 MG tablet TK 1 T PO  TID  . omeprazole (PRILOSEC) 20 MG capsule Take 1 capsule by mouth daily.  . Potassium Chloride ER 20 MEQ TBCR Take 20 mEq by mouth 2 (two) times daily.  . pregabalin (LYRICA) 150 MG capsule Take 150 mg by mouth daily.  . SYMBICORT 160-4.5 MCG/ACT inhaler INHALE 2 PUFFS BID  . tiZANidine (ZANAFLEX) 4 MG tablet Take 1 tablet by mouth every 8 (eight) hours as needed.  . triamcinolone cream (KENALOG) 0.1 % Apply 1 application topically 2 (two) times daily as needed (foot rash).               Review of Systems     Objective:   Physical Exam    Hoarse obese wf nad at rest    Wt Readings from Last 3 Encounters:  06/04/17 250 lb (113.4 kg)  05/14/17 250 lb 12.8 oz (113.8 kg)  05/06/17 242 lb (109.8 kg)    Vital signs reviewed - - Note on arrival 02 sats  99% on RA     HEENT: nl dentition, turbinates bilaterally, and oropharynx. Nl external ear  canals without cough reflex   NECK :  without JVD/Nodes/TM/ nl carotid upstrokes bilaterally   LUNGS: no acc muscle use,  Nl contour chest which is clear to A and P bilaterally without cough on insp or exp maneuvers   CV:  RRR  no s3 or murmur or increase in P2, and trace bilateral sym pitting ankle edema   ABD:  soft and nontender with nl inspiratory excursion in the supine position. No bruits or organomegaly appreciated, bowel sounds nl  MS:  Nl gait/ ext warm without deformities, calf tenderness, cyanosis or clubbing No obvious joint restrictions   SKIN: warm and dry without lesions    NEURO:  alert, approp, nl sensorium with  no motor or cerebellar deficits apparent.      CXR PA and Lateral:   06/04/2017 :    I personally reviewed images and agree with radiology impression as follows:   1. Cardiomegaly with mild pulmonary vascular and interstitial prominence. Very mild changes of congestive heart failure cannot be excluded.  2. Mild basilar atelectasis.        Assessment:

## 2017-06-04 NOTE — Progress Notes (Signed)
LMTCB

## 2017-06-06 DIAGNOSIS — J45901 Unspecified asthma with (acute) exacerbation: Secondary | ICD-10-CM | POA: Insufficient documentation

## 2017-06-06 DIAGNOSIS — J4530 Mild persistent asthma, uncomplicated: Secondary | ICD-10-CM | POA: Insufficient documentation

## 2017-06-06 NOTE — Assessment & Plan Note (Signed)
Body mass index is 45.73 kg/m.  -  trending up Lab Results  Component Value Date   TSH 0.362 03/04/2015     Contributing to gerd risk/ doe/reviewed the need and the process to achieve and maintain neg calorie balance > defer f/u primary care including intermittently monitoring thyroid status

## 2017-06-06 NOTE — Assessment & Plan Note (Signed)
PFT's  11/10/2016  FEV1 2.06 (92 % ) ratio 81   p 24 % improvement from saba p ? prior to study with DLCO  93/94 % corrects to 102  % for alv volume   - 06/04/2017  After extensive coaching HFA effectiveness =    75% > try symbicort 80 2bid

## 2017-06-09 NOTE — Progress Notes (Signed)
Spoke with pt and notified of results per Dr. Wert. Pt verbalized understanding and denied any questions. 

## 2017-06-21 ENCOUNTER — Encounter: Payer: Medicaid Other | Admitting: Adult Health

## 2017-07-05 ENCOUNTER — Telehealth: Payer: Self-pay | Admitting: Internal Medicine

## 2017-07-05 NOTE — Telephone Encounter (Signed)
Spoke with pt, I advised her that she is getting a sleep cpap titration and she would not be able to perform a home sleep test. She states she is unable to find a ride and would have to cancel the appt. I advised her of my understanding but stressed the fact that we needed to get this done to make sure she is on the correct setting. Pt agreed and will reschedule. Nothing further is needed.

## 2017-07-07 ENCOUNTER — Emergency Department (HOSPITAL_COMMUNITY)
Admission: EM | Admit: 2017-07-07 | Discharge: 2017-07-07 | Disposition: A | Discharge status: Home / Self Care | Payer: Medicaid Other | Attending: Emergency Medicine | Admitting: Emergency Medicine

## 2017-07-07 ENCOUNTER — Encounter (HOSPITAL_COMMUNITY): Payer: Self-pay | Admitting: Emergency Medicine

## 2017-07-07 ENCOUNTER — Emergency Department (HOSPITAL_COMMUNITY): Payer: Medicaid Other

## 2017-07-07 DIAGNOSIS — M79604 Pain in right leg: Secondary | ICD-10-CM | POA: Diagnosis not present

## 2017-07-07 DIAGNOSIS — Z79899 Other long term (current) drug therapy: Secondary | ICD-10-CM | POA: Diagnosis not present

## 2017-07-07 DIAGNOSIS — M79605 Pain in left leg: Secondary | ICD-10-CM | POA: Diagnosis not present

## 2017-07-07 DIAGNOSIS — R0789 Other chest pain: Secondary | ICD-10-CM | POA: Diagnosis not present

## 2017-07-07 DIAGNOSIS — R29810 Facial weakness: Secondary | ICD-10-CM | POA: Diagnosis not present

## 2017-07-07 DIAGNOSIS — F209 Schizophrenia, unspecified: Secondary | ICD-10-CM | POA: Insufficient documentation

## 2017-07-07 DIAGNOSIS — R4781 Slurred speech: Secondary | ICD-10-CM | POA: Diagnosis not present

## 2017-07-07 DIAGNOSIS — I5032 Chronic diastolic (congestive) heart failure: Secondary | ICD-10-CM | POA: Diagnosis not present

## 2017-07-07 DIAGNOSIS — Z87891 Personal history of nicotine dependence: Secondary | ICD-10-CM | POA: Insufficient documentation

## 2017-07-07 DIAGNOSIS — R531 Weakness: Secondary | ICD-10-CM | POA: Insufficient documentation

## 2017-07-07 DIAGNOSIS — I110 Hypertensive heart disease with heart failure: Secondary | ICD-10-CM | POA: Insufficient documentation

## 2017-07-07 DIAGNOSIS — R2243 Localized swelling, mass and lump, lower limb, bilateral: Secondary | ICD-10-CM | POA: Diagnosis not present

## 2017-07-07 DIAGNOSIS — J45909 Unspecified asthma, uncomplicated: Secondary | ICD-10-CM | POA: Diagnosis not present

## 2017-07-07 DIAGNOSIS — R51 Headache: Secondary | ICD-10-CM | POA: Diagnosis not present

## 2017-07-07 DIAGNOSIS — R0602 Shortness of breath: Secondary | ICD-10-CM | POA: Diagnosis not present

## 2017-07-07 DIAGNOSIS — I10 Essential (primary) hypertension: Secondary | ICD-10-CM | POA: Diagnosis present

## 2017-07-07 LAB — I-STAT TROPONIN, ED: Troponin i, poc: 0 ng/mL (ref 0.00–0.08)

## 2017-07-07 LAB — CBC
HCT: 41.5 % (ref 36.0–46.0)
Hemoglobin: 13.4 g/dL (ref 12.0–15.0)
MCH: 29.9 pg (ref 26.0–34.0)
MCHC: 32.3 g/dL (ref 30.0–36.0)
MCV: 92.6 fL (ref 78.0–100.0)
Platelets: 236 10*3/uL (ref 150–400)
RBC: 4.48 MIL/uL (ref 3.87–5.11)
RDW: 14.8 % (ref 11.5–15.5)
WBC: 9.4 10*3/uL (ref 4.0–10.5)

## 2017-07-07 LAB — COMPREHENSIVE METABOLIC PANEL
ALT: 28 U/L (ref 14–54)
AST: 13 U/L — ABNORMAL LOW (ref 15–41)
Albumin: 3.6 g/dL (ref 3.5–5.0)
Alkaline Phosphatase: 67 U/L (ref 38–126)
Anion gap: 8 (ref 5–15)
BUN: 12 mg/dL (ref 6–20)
CO2: 24 mmol/L (ref 22–32)
Calcium: 9.1 mg/dL (ref 8.9–10.3)
Chloride: 107 mmol/L (ref 101–111)
Creatinine, Ser: 0.78 mg/dL (ref 0.44–1.00)
GFR calc Af Amer: >60 mL/min (ref >60)
GFR calc non Af Amer: >60 mL/min (ref >60)
Glucose, Bld: 110 mg/dL — ABNORMAL HIGH (ref 65–99)
Potassium: 4 mmol/L (ref 3.5–5.1)
Sodium: 139 mmol/L (ref 135–145)
Total Bilirubin: 0.4 mg/dL (ref 0.3–1.2)
Total Protein: 6.7 g/dL (ref 6.5–8.1)

## 2017-07-07 LAB — DIFFERENTIAL
Basophils Absolute: 0 10*3/uL (ref 0.0–0.1)
Basophils Relative: 0 %
Eosinophils Absolute: 0.4 10*3/uL (ref 0.0–0.7)
Eosinophils Relative: 4 %
Lymphocytes Relative: 29 %
Lymphs Abs: 2.7 10*3/uL (ref 0.7–4.0)
Monocytes Absolute: 0.7 10*3/uL (ref 0.1–1.0)
Monocytes Relative: 8 %
Neutro Abs: 5.6 10*3/uL (ref 1.7–7.7)
Neutrophils Relative %: 59 %

## 2017-07-07 LAB — I-STAT CHEM 8, ED
BUN: 18 mg/dL (ref 6–20)
Calcium, Ion: 1.03 mmol/L — ABNORMAL LOW (ref 1.15–1.40)
Chloride: 105 mmol/L (ref 101–111)
Creatinine, Ser: 0.8 mg/dL (ref 0.44–1.00)
Glucose, Bld: 106 mg/dL — ABNORMAL HIGH (ref 65–99)
HCT: 40 % (ref 36.0–46.0)
Hemoglobin: 13.6 g/dL (ref 12.0–15.0)
Potassium: 6.9 mmol/L (ref 3.5–5.1)
Sodium: 137 mmol/L (ref 135–145)
TCO2: 29 mmol/L (ref 22–32)

## 2017-07-07 LAB — PROTIME-INR
INR: 0.92
Prothrombin Time: 12.2 seconds (ref 11.4–15.2)

## 2017-07-07 LAB — APTT: aPTT: 24 seconds (ref 24–36)

## 2017-07-07 LAB — CBG MONITORING, ED: Glucose-Capillary: 111 mg/dL — ABNORMAL HIGH (ref 65–99)

## 2017-07-07 NOTE — ED Provider Notes (Signed)
Received patient in signout from Dr. Ralene Bathe, patient briefly has had transient facial droop.  Changes sides.  Fatigable on exam.  Awaiting MRI.  MRI is negative.  Discharge home.   Deno Etienne, DO 07/07/17 1640

## 2017-07-07 NOTE — ED Notes (Signed)
On way to MRI 

## 2017-07-07 NOTE — ED Notes (Signed)
Checked patient blood sugar it was 111 notified RN Stacy of blood sugar patient is resting with call bell in reach

## 2017-07-07 NOTE — ED Triage Notes (Signed)
Per EMS, Last seen normal 6am yesterday.  Hypertension x 2 days.  On BP meds.  Slurred speech occurred yesterday morning with right sided facial droop.  Facial droop still noticeable.  A/O x 4. Minor headache on and off and N/V on and off.  90 HR, 168/109 , 141 CBG, 96% RA, RR 20.  12 lead unremarkable.  Hx of blood clots and CHF.  Ascites and lower leg edema x 2 weeks.  On lasix.

## 2017-07-07 NOTE — ED Notes (Signed)
Got patient undress on the monitor did ekg shown to Dr Rees patient is resting with call bell in reach °

## 2017-07-07 NOTE — ED Provider Notes (Signed)
[facility] EMERGENCY DEPARTMENT Provider Note   CSN: [id number] Arrival date & time: 07/07/17  1209     History   Chief Complaint Chief Complaint  Patient presents with  . Stroke Symptoms    HPI Lori Bean is a 46 y.o. female.  The history is provided by the patient. No language interpreter was used.   Lori Bean is a 46 y.o. female who presents to the Emergency Department complaining of "I think I had a stroke."  She believes that she had a stroke yesterday.  She is noticed that her blood pressures have been high for the last 2 days.  Yesterday she noted that she was having difficulty with slurring her speech.  Today she noted she had drooping of the right side of her face.  She does endorse mild intermittent headache.  She also has bilateral leg pain (chronic).  She has gradual worsening of her chronic bilateral lower extremity edema.  No fevers, no pain, vomiting, diarrhea.  She does have some mild intermittent shortness of breath and chest discomfort. Past Medical History:  Diagnosis Date  . Anginal pain ([place])    occ from asthma  . Anxiety   . Arthritis   . Asthma   . Bipolar affective disorder ([place])    Schizophrenia  . Bronchitis    hx of  . CHF (congestive heart failure) ([place])   . Depression   . GERD (gastroesophageal reflux disease)   . Heart murmur   . Hypertension   . Neuropathy    left leg , "from back surgery"  . Overactive bladder   . Schizophrenia ([place])   . Sciatica   . Shortness of breath   . Snoring disorder    Pt stated my boyfriend always wakes me up and tells me to breathe    Patient Active Problem List   Diagnosis Date Noted  . Chronic asthma, mild persistent, uncomplicated 07/11/2535  . DOE (dyspnea on exertion) 05/05/2017  . Asthma exacerbation in COPD ([place]) 05/05/2017  . Morbid obesity due to excess calories ([place]) 05/05/2017  . Asthma exacerbation 12/17/2016  . Chondromalacia, right knee 10/09/2016    . Synovial plica of right knee 64/40/3474  . Tear of medial meniscus of right knee, initial encounter 09/28/2016  . OSA (obstructive sleep apnea) 09/23/2016  . Chronic diastolic heart failure ([place]) 04/22/2016  . Prediabetes 04/22/2016  . Tobacco abuse 04/03/2014  . Obesity (BMI 30-39.9) 04/03/2014  . Chest pain 04/03/2014  . Mild cardiomegaly 02/21/2014  . Lumbar spondylosis 12/28/2013  . Extrinsic asthma 06/07/2013  . Anxiety   . Hypertension   . Bipolar affective disorder ([place])   . GERD (gastroesophageal reflux disease)   . Arthritis   . Schizophrenia ([place])   . Overactive bladder     Past Surgical History:  Procedure Laterality Date  . BACK SURGERY     3 back surgeries  . CHOLECYSTECTOMY    . COLONOSCOPY WITH PROPOFOL N/A 01/23/2016   Procedure: COLONOSCOPY WITH PROPOFOL;  Surgeon: Wonda Horner, MD;  Location: [facility];  Service: Endoscopy;  Laterality: N/A;  . EYE SURGERY     Metal plate in right eye. Had fracture in right eye  . gallstones reomved    . KNEE ARTHROSCOPY WITH MENISCAL REPAIR Right 09/28/2016   Procedure: KNEE ARTHROSCOPY WITH MENISCAL REPAIR;  Surgeon: Dorna Leitz, MD;  Location: [facility];  Service: Orthopedics;  Laterality: Right;  Right partial meniscectomy and chondroplasty, patellar/femoral joint and medial femoral condyle   .  LUMBAR LAMINECTOMY/DECOMPRESSION MICRODISCECTOMY  04/01/2012   Procedure: LUMBAR LAMINECTOMY/DECOMPRESSION MICRODISCECTOMY 2 LEVELS;  Surgeon: Faythe Ghee, MD;  Location: [facility];  Service: Neurosurgery;  Laterality: Left;  Lumbar four-five, lumbar five sacral one microdiscectomy   . LUMBAR WOUND DEBRIDEMENT  04/29/2012   Procedure: LUMBAR WOUND DEBRIDEMENT;  Surgeon: Faythe Ghee, MD;  Location: [facility];  Service: Neurosurgery;  Laterality: N/A;  lumbar wound debridement  . ROTATOR CUFF REPAIR     Right shoulder  . TUBAL LIGATION      OB History    No data available       Home Medications    Prior to  Admission medications   Medication Sig Start Date End Date Taking? Authorizing Provider  albuterol (PROAIR HFA) 108 (90 Base) MCG/ACT inhaler INHALE TWO PUFFS INTO THE LUNGS EVERY SIX HOURS AS NEEDED FOR WHEEZING OR SHORTNESS OF BREATH Patient taking differently: Inhale 2 puffs into the lungs every 6 (six) hours as needed for wheezing or shortness of breath.  11/07/15   Lance Bosch, NP  albuterol (PROVENTIL) (2.5 MG/3ML) 0.083% nebulizer solution USE 1 VIAL VIA NEBULIZER Q 6 H 04/16/17   [provider]  amLODipine (NORVASC) 5 MG tablet Take 1 tablet (5 mg total) by mouth daily. 04/02/17 07/01/17  [name], MD  ARIPiprazole (ABILIFY) 20 MG tablet Take 1 tablet (20 mg total) by mouth at bedtime. Patient taking differently: Take 20 mg by mouth 2 (two) times daily.  03/04/15   Lance Bosch, NP  budesonide-formoterol (SYMBICORT) 80-4.5 MCG/ACT inhaler Inhale 2 puffs into the lungs 2 (two) times daily. 06/04/17   Tanda Rockers, MD  DIOVAN 320 MG tablet Take 1 tablet (320 mg total) by mouth daily. 04/07/17   [name], MD  EUCRISA 2 % OINT APP EXT AA BID 05/11/17   [provider]  famotidine (PEPCID) 20 MG tablet One at bedtime 06/04/17   Tanda Rockers, MD  furosemide (LASIX) 80 MG tablet Take 80 mg by mouth 2 (two) times daily.    [provider]  hydrOXYzine (ATARAX/VISTARIL) 25 MG tablet TK 1 T PO  TID 05/11/17   [provider]  pantoprazole (PROTONIX) 40 MG tablet Take 1 tablet (40 mg total) by mouth daily. Take 30-60 min before first meal of the day 06/04/17   Tanda Rockers, MD  Potassium Chloride ER 20 MEQ TBCR Take 20 mEq by mouth 2 (two) times daily. 12/18/16   Mikhail, Velta Addison, DO  pregabalin (LYRICA) 150 MG capsule Take 150 mg by mouth daily.    [provider]  tiZANidine (ZANAFLEX) 4 MG tablet Take 1 tablet by mouth every 8 (eight) hours as needed. 03/12/17   [provider]  triamcinolone cream (KENALOG) 0.1 % Apply 1  application topically 2 (two) times daily as needed (foot rash). 11/07/15   Lance Bosch, NP    Family History Family History  Problem Relation Age of Onset  . Diabetes Mother   . Hypertension Mother   . Diabetes Father   . Heart disease Paternal Aunt   . Cancer Paternal Aunt     Social History Social History  Substance Use Topics  . Smoking status: Former Smoker    Packs/day: 0.50    Years: 24.00    Types: Cigarettes    Quit date: 05/04/2017  . Smokeless tobacco: Never Used  . Alcohol use No     Comment: quit Nov. 2014     Allergies  Aspirin   Review of Systems Review of Systems  All other systems reviewed and are negative.    Physical Exam Updated Vital Signs BP (!) 169/70 (BP Location: Right Arm)   Pulse (!) 53   Temp 97.7 F (36.5 C) (Oral)   Resp 20   Ht 5\' 2"  (1.575 m)   Wt 113.4 kg (250 lb)   LMP 06/30/2017 (Approximate)   SpO2 96%   BMI 45.73 kg/m   Physical Exam  Constitutional: She is oriented to person, place, and time. She appears well-developed and well-nourished.  HENT:  Head: Normocephalic and atraumatic.  Cardiovascular: Normal rate and regular rhythm.   No murmur heard. Pulmonary/Chest: Effort normal. No respiratory distress.  Decreased air movement in bilateral bases  Abdominal: Soft. There is no tenderness. There is no rebound and no guarding.  Musculoskeletal: She exhibits no tenderness.  Nonpitting edema to bilateral lower extremities  Neurological: She is alert and oriented to person, place, and time.  No facial asymmetry.  5 out of 5 strength in bilateral upper extremities.  5/5 strength in BLE.  Sensation to light touch intact in all 4 extremities.  Sensation to light touch intact throughout the face.  Skin: Skin is warm and dry.  Psychiatric: She has a normal mood and affect. Her behavior is normal.  Nursing note and vitals reviewed.    ED Treatments / Results  Labs (all labs ordered are listed, but only abnormal  results are displayed) Labs Reviewed  PROTIME-INR  APTT  CBC  DIFFERENTIAL  COMPREHENSIVE METABOLIC PANEL  I-STAT TROPONIN, ED  CBG MONITORING, ED  I-STAT CHEM 8, ED    EKG  EKG Interpretation  Date/Time:  Wednesday July 07 2017 12:27:19 EDT Ventricular Rate:  88 PR Interval:    QRS Duration: 93 QT Interval:  315 QTC Calculation: 381 R Axis:   42 Text Interpretation:  Sinus rhythm Borderline T wave abnormalities Confirmed by Quintella Reichert [phone]) on 07/07/2017 12:30:51 PM       Radiology No results found.  Procedures Procedures (including critical care time)  Medications Ordered in ED Medications - No data to display   Initial Impression / Assessment and Plan / ED Course  I have reviewed the triage vital signs and the nursing notes.  Pertinent labs & imaging results that were available during my care of the patient were reviewed by me and considered in my medical decision making (see chart for details).     Patient presents for evaluation for strokelike symptoms.  She reports slurred speech as well as facial droop.  She does appear to have these intermittently while in the department and has frequent movement of her lower lip but no focal weakness on strength testing.  I-STAT Chem-8 with hyperkalemia, likely secondary to hemolysis.  EKG with no acute abnormalities and CMP without any hyperkalemia.  Plan to obtain MRI to rule out stroke given intermittent exam findings.  Patient care transferred pending MRI.  Final Clinical Impressions(s) / ED Diagnoses   Final diagnoses:  None    New Prescriptions New Prescriptions   No medications on file     Quintella Reichert, MD 07/07/17 [phone]

## 2017-07-09 ENCOUNTER — Encounter (HOSPITAL_BASED_OUTPATIENT_CLINIC_OR_DEPARTMENT_OTHER): Payer: Medicaid Other

## 2017-07-12 ENCOUNTER — Encounter: Payer: Self-pay | Admitting: Cardiovascular Disease

## 2017-07-12 ENCOUNTER — Ambulatory Visit (INDEPENDENT_AMBULATORY_CARE_PROVIDER_SITE_OTHER): Payer: Medicaid Other | Admitting: Cardiovascular Disease

## 2017-07-12 VITALS — BP 153/96 | HR 99 | Ht 62.0 in | Wt 249.4 lb

## 2017-07-12 DIAGNOSIS — I5032 Chronic diastolic (congestive) heart failure: Secondary | ICD-10-CM

## 2017-07-12 DIAGNOSIS — I110 Hypertensive heart disease with heart failure: Secondary | ICD-10-CM | POA: Diagnosis not present

## 2017-07-12 MED ORDER — CARVEDILOL 12.5 MG PO TABS: 12.5000 mg | ORAL_TABLET | Freq: Two times a day (BID) | ORAL | 3 refills | Status: DC

## 2017-07-12 NOTE — Patient Instructions (Signed)
Medication Instructions:  START CARVEDILOL 12.5 MG TWICE A DAY   Labwork: NONE  Testing/Procedures: NONE  Follow-Up: Your physician recommends that you schedule a follow-up appointment in: [place]  Your physician recommends that you schedule a follow-up appointment in: [facility]   Any Other Special Instructions Will Be Listed Below (If Applicable). MONITOR YOUR BLOOD PRESSURE AT HOME AND IF IT REMAINS ABOVE 130/80 INCREASE YOUR CARVEDILOL TO 25 MG TWICE A DAY. CALL THE OFFICE AND UPDATE IF YOU INCREASE [phone]  If you need a refill on your cardiac medications before your next appointment, please call your pharmacy.--

## 2017-07-12 NOTE — Progress Notes (Signed)
Cardiology Office Note   Date:  07/12/2017   ID:  Lori Bean, DOB [birth year], MRN [id number]  PCP:  [name], PA  Cardiologist:   [name], MD   Chief Complaint  Patient presents with  . Follow-up  . Shortness of Breath  . Fatigue      Patient ID: Lori Bean is a 46 y.o. female with chronic diastolic heart failure, hypertensive heart disease, OSA, schizophrenia, bipolar disorder, obesity and prior PE who presents for follow up.  Lori Bean was initially seen 05/2015 with fatigue and shortness of breath.  At the time she endorsed heart failure symptoms, including lower extremity edema, orthopnea and shortness of breath.  She also reported atypical chest pain.  After that appointment she was referred to the hospital and had a [place] 05/26/15 that was negative for ischemia and revealed LVEF 57%.  She had an echo 05/24/15 with LVEF 60-65% and grade 2 diastolic dysfunction.    At her last appointment Lori Bean was noted to be taking both Lasix and HCTZ/triamterene.  HCTZ/triamterene was discontinued and valsartan was increased.  She followed up with our pharmacist 7/25 and her blood pressure was above goal.  Valsartan was increased to 320 mg daily.  She again followed up to 8/22 and was short of breath and wheezing.  Her weight was up 5 pounds.  She was assessed by Dr. Debara Pickett as the DOD.  This was thought to be a COPD/asthma exacerbation.  Furosemide was also increased for 3 days.  She was seen in the emergency department 8/23 with hypertension and shortness of breath.  Chest x-ray was unremarkable.  Her symptoms improved with a nebulizer treatment.  She was again seen in the emergency department 10/24 with stroke-like symptoms.  She had slurring of her speech and drooping of the right side of her face.  CT and MRI of her head were unremarkable.  Since her last appointment Lori Bean continues to struggle with her blood pressure.  At home  it has been running in the 130s-190s.  Most readings are in the 170s to 180s.  She has been feeling very tired.  She reports chest pain, headache and shortness of breath.  Her blood pressure has been running very high lately.  When her BP is high she drinks pickle juice and vinegar, which she heard is good for blood pressure.  She denies chest pain but has been short of breath with exertion.  She is unable to walk to her mailbox without getting short of breath.  She endorses orthopnea and LE edema.  For the last two week she has also noted dysphagia.  She denies odynophagia.    Past Medical History:  Diagnosis Date  . Anginal pain ([place])    occ from asthma  . Anxiety   . Arthritis   . Asthma   . Bipolar affective disorder ([place])    Schizophrenia  . Bronchitis    hx of  . CHF (congestive heart failure) ([place])   . Depression   . GERD (gastroesophageal reflux disease)   . Heart murmur   . Hypertension   . Neuropathy    left leg , "from back surgery"  . Overactive bladder   . Schizophrenia ([place])   . Sciatica   . Shortness of breath   . Snoring disorder    Pt stated my boyfriend always wakes me up and tells me to breathe    Past Surgical History:  Procedure Laterality Date  .  BACK SURGERY     3 back surgeries  . CHOLECYSTECTOMY    . COLONOSCOPY WITH PROPOFOL N/A 01/23/2016   Procedure: COLONOSCOPY WITH PROPOFOL;  Surgeon: Wonda Horner, MD;  Location: [facility];  Service: Endoscopy;  Laterality: N/A;  . EYE SURGERY     Metal plate in right eye. Had fracture in right eye  . gallstones reomved    . KNEE ARTHROSCOPY WITH MENISCAL REPAIR Right 09/28/2016   Procedure: KNEE ARTHROSCOPY WITH MENISCAL REPAIR;  Surgeon: Dorna Leitz, MD;  Location: [facility];  Service: Orthopedics;  Laterality: Right;  Right partial meniscectomy and chondroplasty, patellar/femoral joint and medial femoral condyle   . LUMBAR LAMINECTOMY/DECOMPRESSION MICRODISCECTOMY  04/01/2012   Procedure: LUMBAR  LAMINECTOMY/DECOMPRESSION MICRODISCECTOMY 2 LEVELS;  Surgeon: Faythe Ghee, MD;  Location: [facility];  Service: Neurosurgery;  Laterality: Left;  Lumbar four-five, lumbar five sacral one microdiscectomy   . LUMBAR WOUND DEBRIDEMENT  04/29/2012   Procedure: LUMBAR WOUND DEBRIDEMENT;  Surgeon: Faythe Ghee, MD;  Location: [facility];  Service: Neurosurgery;  Laterality: N/A;  lumbar wound debridement  . ROTATOR CUFF REPAIR     Right shoulder  . TUBAL LIGATION       Current Outpatient Prescriptions  Medication Sig Dispense Refill  . albuterol (PROAIR HFA) 108 (90 Base) MCG/ACT inhaler INHALE TWO PUFFS INTO THE LUNGS EVERY SIX HOURS AS NEEDED FOR WHEEZING OR SHORTNESS OF BREATH (Patient taking differently: Inhale 2 puffs into the lungs every 6 (six) hours as needed for wheezing or shortness of breath. ) 8.5 g 3  . albuterol (PROVENTIL) (2.5 MG/3ML) 0.083% nebulizer solution USE 1 VIAL VIA NEBULIZER Q 6 H  2  . amLODipine (NORVASC) 5 MG tablet Take 1 tablet (5 mg total) by mouth daily. 30 tablet 3  . ARIPiprazole (ABILIFY) 20 MG tablet Take 1 tablet (20 mg total) by mouth at bedtime. (Patient taking differently: Take 20 mg by mouth 2 (two) times daily. ) 30 tablet 0  . budesonide-formoterol (SYMBICORT) 80-4.5 MCG/ACT inhaler Inhale 2 puffs into the lungs 2 (two) times daily. 1 Inhaler 11  . DIOVAN 320 MG tablet Take 1 tablet (320 mg total) by mouth daily. 30 tablet 5  . EUCRISA 2 % OINT APP EXT AA BID  0  . famotidine (PEPCID) 20 MG tablet One at bedtime 30 tablet 11  . furosemide (LASIX) 80 MG tablet Take 80 mg by mouth 2 (two) times daily.    . hydrOXYzine (ATARAX/VISTARIL) 25 MG tablet TK 1 T PO  TID  0  . pantoprazole (PROTONIX) 40 MG tablet Take 1 tablet (40 mg total) by mouth daily. Take 30-60 min before first meal of the day 30 tablet 11  . Potassium Chloride ER 20 MEQ TBCR Take 20 mEq by mouth 2 (two) times daily. 60 tablet 0  . pregabalin (LYRICA) 150 MG capsule Take 150 mg by  mouth daily.    [name] tiZANidine (ZANAFLEX) 4 MG tablet Take 1 tablet by mouth every 8 (eight) hours as needed.  1  . triamcinolone cream (KENALOG) 0.1 % Apply 1 application topically 2 (two) times daily as needed (foot rash). 30 g 2  . carvedilol (COREG) 12.5 MG tablet Take 1 tablet (12.5 mg total) by mouth 2 (two) times daily. 180 tablet 3   No current facility-administered medications for this visit.     Allergies:   Aspirin    Social History:  The patient  reports that she quit smoking about 2 months ago. Her  smoking use included Cigarettes. She has a 12.00 pack-year smoking history. She has never used smokeless tobacco. She reports that she does not drink alcohol or use drugs.   Family History:  The patient's family history includes Cancer in her paternal aunt; Diabetes in her father and mother; Heart disease in her paternal aunt; Hypertension in her mother.    ROS:  Please see the history of present illness.   Otherwise, review of systems are positive for none.   All other systems are reviewed and negative.    PHYSICAL EXAM: VS:  BP (!) 153/96   Pulse 99   Ht 5\' 2"  (1.575 m)   Wt 113.1 kg (249 lb 6.4 oz)   LMP 06/30/2017 (Approximate)   BMI 45.62 kg/m  , BMI Body mass index is 45.62 kg/m. GENERAL:  Well appearing.  No acute distress HEENT: Pupils equal round and reactive, fundi not visualized, oral mucosa unremarkable NECK:  No jugular venous distention, waveform within normal limits, carotid upstroke brisk and symmetric, no bruits, no thyromegaly LUNGS:  Clear to auscultation bilaterally.  No crackles, wheezes or rhonchi.  HEART:  RRR.  PMI not displaced or sustained,S1 and S2 within normal limits, no S3, no S4, no clicks, no rubs, no murmurs ABD:  Flat, positive bowel sounds normal in frequency in pitch, no bruits, no rebound, no guarding, no midline pulsatile mass, no hepatomegaly, no splenomegaly EXT:  2 plus pulses throughout, no edema, no cyanosis no clubbing SKIN:  No  rashes no nodules NEURO:  Cranial nerves II through XII grossly intact, motor grossly intact throughout PSYCH:  Cognitively intact, oriented to person place and time   EKG:  EKG is not ordered today. The ekg ordered 09/18/16 demonstrates sinus rhythm. Rate 77 bpm.  Non-specific T wave changes.    Echo 05/24/15: Study Conclusions  - Left ventricle: The cavity size was normal. Wall thickness was  increased in a pattern of mild LVH. Systolic function was normal.  The estimated ejection fraction was in the range of 60% to 65%.  Wall motion was normal; there were no regional wall motion  abnormalities. Features are consistent with a pseudonormal left  ventricular filling pattern, with concomitant abnormal relaxation  and increased filling pressure (grade 2 diastolic dysfunction).  Lexiscan Myoview 05/26/15: IMPRESSION: 1. No reversible ischemia or infarction.  2. Mild septal hypokinesis.  3. Left ventricular ejection fraction 57%  4. Low-risk stress test findings*.   Recent Labs: 05/06/2017: B Natriuretic Peptide 12.9 07/07/2017: ALT 28; BUN 18; Creatinine, Ser 0.80; Hemoglobin 13.6; Platelets 236; Potassium 6.9; Sodium 137    Lipid Panel    Component Value Date/Time   CHOL 133 05/25/2015 0333   TRIG 105 05/25/2015 0333   HDL 50 05/25/2015 0333   CHOLHDL 2.7 05/25/2015 0333   VLDL 21 05/25/2015 0333   LDLCALC 62 05/25/2015 0333      Wt Readings from Last 3 Encounters:  07/12/17 113.1 kg (249 lb 6.4 oz)  07/07/17 113.4 kg (250 lb)  06/04/17 113.4 kg (250 lb)     Other studies Reviewed: Additional studies/ records that were reviewed today include: . Review of the above records demonstrates:  Please see elsewhere in the note.     ASSESSMENT AND PLAN:  # Hypertensive heart disease:  Blood pressure is elevated.  We will start carvedilol 12.5mg  bid.  If her BP remains >130/80, she can increase to 25mg .  Continue amlodipine, Valsartan (brand), and lasix. I  recommended that she stop drinking pickle juice, as  this is high in sodium.   # Chronic diastolic heart failure:  # Obesity: # Shortness of breath: # Asthma/COPD: Ms. Oakland is euvolemic on exam today.  She reports lower extremity and upper extremity edema.  We discussed the fact that this is not edema, but rather fatty tissue.  She was encouraged to start exercising most days of the week.  # Chest pain: Resolved.   She had a negative stress test 05/2015.  # Tobacco abuse: Encouraged smoking cessation.   # Morbid Obesity: We discussed her weight gain and the importance of limiting calories and increasing her physical exercise.  Current medicines are reviewed at length with the patient today.  The patient does not have concerns regarding medicines.  The following changes have been made:  Start carvedilol 25mg  bid.    Labs/ tests ordered today include:    No orders of the defined types were placed in this encounter.    Disposition:  FU with Dr. Jonelle Sidle C. [place] in 3 months.  PharmD in 1 month.   Signed, [name], MD  07/12/2017 6:10 PM    [facility]

## 2017-07-19 ENCOUNTER — Telehealth: Payer: Self-pay | Admitting: Cardiovascular Disease

## 2017-07-19 ENCOUNTER — Encounter: Payer: Medicaid Other | Admitting: Adult Health

## 2017-07-19 NOTE — Telephone Encounter (Signed)
Increase carvedilol to 25 mg twice daily.  If she is Artie done that then increase amlodipine to 10 mg daily.

## 2017-07-19 NOTE — Telephone Encounter (Signed)
Pt says her blood pressure medicine needs to be increased,her blood pressure is still high.

## 2017-07-19 NOTE — Telephone Encounter (Signed)
Returned call to patient, patient states she was instructed to call our office if her BP continued to run >130/80.   Reports she has been taking carvedilol 25mg  BID for 4 days and it continues to run >130/80.   Patient states she has been taking her BP but does not write it down.    Reports a nagging HA and fatigue.   Patient wanted to see if she needed to add any additional medication.     Per last ov note:   # Hypertensive heart disease:  Blood pressure is elevated.  We will start carvedilol 12.5mg  bid.  If her BP remains >130/80, she can increase to 25mg .  Continue amlodipine, Valsartan (brand), and lasix. I recommended that she stop drinking pickle juice, as this is high in sodium.   Routed to Dr. Oval Linsey for review

## 2017-07-20 MED ORDER — CARVEDILOL 25 MG PO TABS: 25.0000 mg | ORAL_TABLET | Freq: Two times a day (BID) | ORAL | 3 refills | Status: DC

## 2017-07-20 MED ORDER — AMLODIPINE BESYLATE 10 MG PO TABS: 10.0000 mg | ORAL_TABLET | Freq: Every day | ORAL | 3 refills | Status: DC

## 2017-07-20 NOTE — Telephone Encounter (Signed)
Spoke with pt, message received and patient agreed with the change. New script sent to the pharmacy

## 2017-07-20 NOTE — Telephone Encounter (Signed)
F/u Message ° °Pt returning RN call .please call back to discuss  °

## 2017-07-20 NOTE — Telephone Encounter (Signed)
Left detailed message (ok per DPR) with recommendations.   Advised to return call to confirm message was received.

## 2017-07-21 ENCOUNTER — Ambulatory Visit (HOSPITAL_BASED_OUTPATIENT_CLINIC_OR_DEPARTMENT_OTHER): Payer: Medicaid Other | Attending: Adult Health | Admitting: Internal Medicine

## 2017-07-21 VITALS — Ht 62.0 in | Wt 250.0 lb

## 2017-07-21 DIAGNOSIS — Z9989 Dependence on other enabling machines and devices: Secondary | ICD-10-CM

## 2017-07-21 DIAGNOSIS — G4733 Obstructive sleep apnea (adult) (pediatric): Secondary | ICD-10-CM

## 2017-07-22 ENCOUNTER — Encounter: Payer: Self-pay | Admitting: Adult Health

## 2017-07-22 ENCOUNTER — Ambulatory Visit: Payer: Medicaid Other | Admitting: Adult Health

## 2017-07-22 DIAGNOSIS — G4733 Obstructive sleep apnea (adult) (pediatric): Secondary | ICD-10-CM

## 2017-07-22 DIAGNOSIS — J4530 Mild persistent asthma, uncomplicated: Secondary | ICD-10-CM

## 2017-07-22 DIAGNOSIS — Z720 Tobacco use: Secondary | ICD-10-CM | POA: Diagnosis not present

## 2017-07-22 DIAGNOSIS — Z23 Encounter for immunization: Secondary | ICD-10-CM | POA: Diagnosis not present

## 2017-07-22 NOTE — Assessment & Plan Note (Signed)
CPAP titration results pending .   Cont on CPAP At bedtime  .

## 2017-07-22 NOTE — Assessment & Plan Note (Signed)
Wt loss  

## 2017-07-22 NOTE — Assessment & Plan Note (Signed)
Smoking cessation  

## 2017-07-22 NOTE — Progress Notes (Signed)
Chart and office note reviewed in detail  > agree with a/p as outlined    

## 2017-07-22 NOTE — Patient Instructions (Addendum)
Continue on Symbicort Twice daily  . Rinse after use.  Work on not smoking  Flu shot today  Follow up with Dr. Melvyn Novas  In 3 months    Try to wear CPAP for at least 4 hr each night  Do not drive if sleepy.  Work on weight loss.  Follow up with Dr. [name]  In 2 months and As needed  For sleep .   Please contact office for sooner follow up if symptoms do not improve or worsen or seek emergency care

## 2017-07-22 NOTE — Progress Notes (Signed)
@Patient  ID: Lori Bean, female    DOB: 13-Jun-1971, 46 y.o.   MRN: [id number]  Chief Complaint  Patient presents with  . Follow-up    asthma     Referring provider: [name], [place]  HPI: 46 yo female former smoker  seen for sleep evaluation and dyspnea  09/23/16 for evaluation of sleep apnea. Found to have moderate OSA and Asthma   PMH of HTN heart dz, Diastolic CHF , Bipolar and Schizophrenia   TEST  06/2016 sleep study AHI 26/hr  PFT's  11/10/2016  FEV1 2.06 (92 % ) ratio 81   p 24 % improvement from saba p ? prior to study with DLCO  93/94 % corrects to 102  % for alv volume   - 06/04/2017  After extensive coaching HFA effectiveness =      07/22/2017 Follow up ; Asthma and OSA  Pt presents for a follow up . Says breathing has been doing better with less coughing and wheezing . She is taking Symbicort on regular basis . Denies flare of cough or wheezing .  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  Started back smoking , discussed cessation discussed .  Need flu shot today .    Had CPAP titration study  Last pm.  We discussed wearing CPAP .each  Night . Says last night slept good with CPAP .  Discussed weight loss.   She brought a few of her meds and we updated her MAR accordingly . Brought Symbicort and is taking properly.    Allergies  Allergen Reactions  . Aspirin Nausea And Vomiting    Immunization History  Administered Date(s) Administered  . Influenza Split 06/07/2013, 06/15/2015  . Pneumococcal Polysaccharide-23 04/03/2014  . Rabies, IM 05/04/2016, 05/12/2016    Past Medical History:  Diagnosis Date  . Anginal pain ([place])    occ from asthma  . Anxiety   . Arthritis   . Asthma   . Bipolar affective disorder ([place])    Schizophrenia  . Bronchitis    hx of  . CHF (congestive heart failure) ([place])   . Depression   . GERD (gastroesophageal reflux disease)   . Heart murmur   . Hypertension   . Neuropathy    left leg , "from back  surgery"  . Overactive bladder   . Schizophrenia ([place])   . Sciatica   . Shortness of breath   . Snoring disorder    Pt stated my boyfriend always wakes me up and tells me to breathe    Tobacco History: Social History   Tobacco Use  Smoking Status Former Smoker  . Packs/day: 0.50  . Years: 24.00  . Pack years: 12.00  . Types: Cigarettes  . Last attempt to quit: 05/04/2017  . Years since quitting: 0.2  Smokeless Tobacco Never Used   Counseling given: Not Answered   Outpatient Encounter Medications as of 07/22/2017  Medication Sig  . albuterol (PROAIR HFA) 108 (90 Base) MCG/ACT inhaler INHALE TWO PUFFS INTO THE LUNGS EVERY SIX HOURS AS NEEDED FOR WHEEZING OR SHORTNESS OF BREATH (Patient taking differently: Inhale 2 puffs into the lungs every 6 (six) hours as needed for wheezing or shortness of breath. )  . albuterol (PROVENTIL) (2.5 MG/3ML) 0.083% nebulizer solution USE 1 VIAL VIA NEBULIZER Q 6 H  . amLODipine (NORVASC) 10 MG tablet Take 1 tablet (10 mg total) daily by mouth.  . ARIPiprazole (ABILIFY) 20 MG tablet Take 1 tablet (20 mg total) by mouth at  bedtime. (Patient taking differently: Take 20 mg by mouth 2 (two) times daily. )  . budesonide-formoterol (SYMBICORT) 80-4.5 MCG/ACT inhaler Inhale 2 puffs into the lungs 2 (two) times daily.  . carvedilol (COREG) 25 MG tablet Take 1 tablet (25 mg total) 2 (two) times daily by mouth.  . DIOVAN 320 MG tablet Take 1 tablet (320 mg total) by mouth daily.  . EUCRISA 2 % OINT APP EXT AA BID  . famotidine (PEPCID) 20 MG tablet One at bedtime  . furosemide (LASIX) 80 MG tablet Take 80 mg by mouth 2 (two) times daily.  . hydrOXYzine (ATARAX/VISTARIL) 25 MG tablet TK 1 T PO  TID  . pantoprazole (PROTONIX) 40 MG tablet Take 1 tablet (40 mg total) by mouth daily. Take 30-60 min before first meal of the day  . Potassium Chloride ER 20 MEQ TBCR Take 20 mEq by mouth 2 (two) times daily.  . pregabalin (LYRICA) 150 MG capsule Take 150 mg by mouth  daily.  [name] tiZANidine (ZANAFLEX) 4 MG tablet Take 1 tablet by mouth every 8 (eight) hours as needed.  . triamcinolone cream (KENALOG) 0.1 % Apply 1 application topically 2 (two) times daily as needed (foot rash).   No facility-administered encounter medications on file as of 07/22/2017.      Review of Systems  Constitutional:   No  weight loss, night sweats,  Fevers, chills, + fatigue, or  lassitude.  HEENT:   No headaches,  Difficulty swallowing,  Tooth/dental problems, or  Sore throat,                No sneezing, itching, ear ache, nasal congestion, post nasal drip,   CV:  No chest pain,  Orthopnea, PND, swelling in lower extremities, anasarca, dizziness, palpitations, syncope.   GI  No heartburn, indigestion, abdominal pain, nausea, vomiting, diarrhea, change in bowel habits, loss of appetite, bloody stools.   Resp:   No excess mucus, no productive cough,  No non-productive cough,  No coughing up of blood.  No change in color of mucus.  No wheezing.  No chest wall deformity  Skin: no rash or lesions.  GU: no dysuria, change in color of urine, no urgency or frequency.  No flank pain, no hematuria   MS:  No joint pain or swelling.  No decreased range of motion.  No back pain.    Physical Exam  BP 124/76 (BP Location: Left Arm, Cuff Size: Large)   Pulse 71   Ht 5\' 2"  (1.575 m)   Wt 256 lb (116.1 kg)   LMP 06/30/2017 (Approximate)   SpO2 100%   BMI 46.82 kg/m   GEN: A/Ox3; pleasant , NAD, obese    HEENT:  [place]/AT,  EACs-clear, TMs-wnl, NOSE-clear, THROAT-clear, no lesions, no postnasal drip or exudate noted.   NECK:  Supple w/ fair ROM; no JVD; normal carotid impulses w/o bruits; no thyromegaly or nodules palpated; no lymphadenopathy.    RESP  Clear  P & A; w/o, wheezes/ rales/ or rhonchi. no accessory muscle use, no dullness to percussion  CARD:  RRR, no m/r/g, no peripheral edema, pulses intact, no cyanosis or clubbing.  GI:   Soft & nt; nml bowel sounds; no  organomegaly or masses detected.   Musco: Warm bil, no deformities or joint swelling noted.   Neuro: alert, no focal deficits noted.    Skin: Warm, no lesions or rashes    Lab Results:  CBC  BNP  Imaging:    Assessment & Plan:  Chronic asthma, mild persistent, uncomplicated Improved symptom control on Symbicort   Plan  Patient Instructions  Continue on Symbicort Twice daily  . Rinse after use.  Work on not smoking  Flu shot today  Follow up with Dr. Melvyn Novas  In 3 months    Try to wear CPAP for at least 4 hr each night  Do not drive if sleepy.  Work on weight loss.  Follow up with Dr. [name]  In 2 months and As needed  For sleep .   Please contact office for sooner follow up if symptoms do not improve or worsen or seek emergency care      OSA (obstructive sleep apnea) CPAP titration results pending .   Cont on CPAP At bedtime  .      Rexene Edison, NP 07/22/2017

## 2017-07-22 NOTE — Assessment & Plan Note (Signed)
Improved symptom control on Symbicort   Plan  Patient Instructions  Continue on Symbicort Twice daily  . Rinse after use.  Work on not smoking  Flu shot today  Follow up with Dr. Melvyn Novas  In 3 months    Try to wear CPAP for at least 4 hr each night  Do not drive if sleepy.  Work on weight loss.  Follow up with Dr. [name]  In 2 months and As needed  For sleep .   Please contact office for sooner follow up if symptoms do not improve or worsen or seek emergency care

## 2017-07-27 DIAGNOSIS — G4733 Obstructive sleep apnea (adult) (pediatric): Secondary | ICD-10-CM | POA: Diagnosis not present

## 2017-07-27 DIAGNOSIS — Z9989 Dependence on other enabling machines and devices: Secondary | ICD-10-CM | POA: Diagnosis not present

## 2017-07-27 NOTE — Procedures (Signed)
Patient Name: Lori Bean, Early Date: 07/21/2017 Gender: Female D.O.B: May 23, 1971 Age (years): 36 Referring Provider: Baird Lyons MD, ABSM Height (inches): 62 Interpreting Physician: Baird Lyons MD, ABSM Weight (lbs): 209 RPSGT: Jonna Coup BMI: 38 MRN: [id number] Neck Size: 16.00 CLINICAL INFORMATION The patient is referred for a BiPAP titration to treat sleep apnea.  Date of NPSG, Split Night or HST:  06/19/16  AHI 26.1/ hr, desaturation to 87%, body weight 201 lbs, CPAP titrated to 14  SLEEP STUDY TECHNIQUE As per the AASM Manual for the Scoring of Sleep and Associated Events v2.3 (April 2016) with a hypopnea requiring 4% desaturations.  The channels recorded and monitored were frontal, central and occipital EEG, electrooculogram (EOG), submentalis EMG (chin), nasal and oral airflow, thoracic and abdominal wall motion, anterior tibialis EMG, snore microphone, electrocardiogram, and pulse oximetry. Bilevel positive airway pressure (BPAP) was initiated at the beginning of the study and titrated to treat sleep-disordered breathing.  MEDICATIONS Medications self-administered by patient taken the night of the study : N/A  RESPIRATORY PARAMETERS Optimal IPAP Pressure (cm): 16 AHI at Optimal Pressure (/hr) 0.0 Optimal EPAP Pressure (cm): 12   Overall Minimal O2 (%): 87.00 Minimal O2 at Optimal Pressure (%): 87.0  SLEEP ARCHITECTURE Start Time: 10:22:39 PM Stop Time: 4:23:14 AM Total Time (min): 360.6 Total Sleep Time (min): 357.1 Sleep Latency (min): 0.0 Sleep Efficiency (%): 99.0 REM Latency (min): 96.0 WASO (min): 3.5 Stage N1 (%): 0.28 Stage N2 (%): 78.44 Stage N3 (%): 0.14 Stage R (%): 21.14 Supine (%): 78.28 Arousal Index (/hr): 25.0      CARDIAC DATA The 2 lead EKG demonstrated sinus rhythm. The mean heart rate was 76.58 beats per minute. Other EKG findings include: None.  LEG MOVEMENT DATA The total Periodic Limb Movements of Sleep (PLMS) were 21. The PLMS  index was 3.53. A PLMS index of <15 is considered normal in adults.  IMPRESSIONS - CPAP didn't provide control at tolerated pressures. An optimal BiPAP pressure was selected for this patient ( 16 / 12 cm of water) - Central sleep apnea was not noted during this titration (CAI = 0.0/h). - Mild oxygen desaturations were observed during this titration (min O2 = 87.00%). At final BIPAP, Minimal O2 sat was 87%, Mean 91.6%, and time below 89% was 8.5 minutes. - The patient snored with soft snoring volume. - No cardiac abnormalities were observed during this study. - Clinically significant periodic limb movements were not noted during this study. Arousals associated with PLMs were rare.  DIAGNOSIS - Obstructive Sleep Apnea (327.23 [G47.33 ICD-10])  RECOMMENDATIONS - Trial of BiPAP therapy on 16/12 cm H2O with a Medium size Fisher&Paykel Full Face Mask Simplus mask and heated humidification. - Be careful with alcohol, sedatives and other CNS depressants that may worsen sleep apnea and disrupt normal sleep architecture. - Sleep hygiene should be reviewed to assess factors that may improve sleep quality. - Weight management and regular exercise should be initiated or continued.  [Electronically signed] 07/27/2017 08:22 AM  Baird Lyons MD, [place], American Board of Sleep Medicine   NPI: [id number]

## 2017-07-28 NOTE — Progress Notes (Signed)
[id number] x 1 

## 2017-07-29 ENCOUNTER — Telehealth: Payer: Self-pay | Admitting: Internal Medicine

## 2017-07-29 DIAGNOSIS — G4733 Obstructive sleep apnea (adult) (pediatric): Secondary | ICD-10-CM

## 2017-07-29 NOTE — Progress Notes (Signed)
Pt returned call and advised on results per 07/29/17 phone note

## 2017-07-29 NOTE — Telephone Encounter (Signed)
Result Notes for Cpap titration  Notes recorded by Rinaldo Ratel, CMA on 07/28/2017 at 5:31 PM EST LMOM TCB x1. ------  Notes recorded by Melvenia Needles, NP on 07/27/2017 at 3:31 PM EST CPAP titration showed she need BIPAP to control events   Plan  Begin Trial of BiPAP therapy on 16/12 cm H2O with a Medium size  Will need ov in 6-8 weeks with download    Called spoke with patient, advised of titration results/recs as stated by TP.  Pt okay with beginning BiPAP.  Order will be sent to [facility].  Quick question, [name].  The order for new start BiPAP requires a Pressure Support to be included.  Please advise, thank you.

## 2017-07-30 NOTE — Progress Notes (Signed)
Reviewed & agree with plan  

## 2017-08-02 NOTE — Telephone Encounter (Signed)
TP spoke with CY who interpreted the study Per CY: pressure support = 3 Order placed Nothing further needed; will sign off

## 2017-08-12 ENCOUNTER — Encounter: Payer: Self-pay | Admitting: Pharmacist

## 2017-08-12 ENCOUNTER — Ambulatory Visit (INDEPENDENT_AMBULATORY_CARE_PROVIDER_SITE_OTHER): Payer: Medicaid Other | Admitting: Pharmacist

## 2017-08-12 VITALS — BP 102/52 | HR 78

## 2017-08-12 DIAGNOSIS — I10 Essential (primary) hypertension: Secondary | ICD-10-CM | POA: Diagnosis not present

## 2017-08-12 NOTE — Assessment & Plan Note (Addendum)
Patient blood pressure of 102/52 is significantly lower that all previous readings. She denies symptoms of hypotension or problems with current therapy. Reports BP ~110s/60s at home but no records are available today for assessment. Will continue current medication without changes and follow up in clinic as needed. Patient encouraged to contact clinic with any problems or questions.

## 2017-08-12 NOTE — Progress Notes (Signed)
Patient ID: NASHAE Bean                 DOB: Dec 12, 1970                      MRN: [id number]     HPI: Lori Bean is a 46 y.o. female referred by Dr. Oval Linsey to HTN clinic. PMH includes HF, hypertension, OSA with recent BiPAP adjustment in 11/15 by Dr Melvyn Novas (pulmonologist), asthma, schizophrenia, bipolar disorder, obesity, and prior history of PE.  Patient with multiple visit to ER and medication changes since last seen at HTN clinic. Presents  today  HTN follow up and reports feeling great now. Denies dizziness, chest pain, headaches, increased swelling or increased fatigue.  Current HTN meds:  Amlodipine 10mg  daily Diovan 320mg  daily Carvedilol 25mg  twice daily Furosemide 80mg  twice daily  BP goal: <130/80  Family History: Diabetes in her father and mother; Hypertension in her mother.  Social History: Current smoker, never used smokeless tobacco. She reports that she does not drink alcohol or use drugs.  Diet: not following any special diet; lont time spent discussing low sodium diet  Exercise: sedentary at this time  Home BP readings: none available for assessment today (reports readings ~112/60 at home)  Wt Readings from Last 3 Encounters:  07/22/17 256 lb (116.1 kg)  07/21/17 250 lb (113.4 kg)  07/12/17 249 lb 6.4 oz (113.1 kg)   BP Readings from Last 3 Encounters:  08/12/17 (!) 102/52  07/22/17 124/76  07/12/17 (!) 153/96   Pulse Readings from Last 3 Encounters:  08/12/17 78  07/22/17 71  07/12/17 99    Past Medical History:  Diagnosis Date  . Anginal pain ([place])    occ from asthma  . Anxiety   . Arthritis   . Asthma   . Bipolar affective disorder ([place])    Schizophrenia  . Bronchitis    hx of  . CHF (congestive heart failure) ([place])   . Depression   . GERD (gastroesophageal reflux disease)   . Heart murmur   . Hypertension   . Neuropathy    left leg , "from back surgery"  . Overactive bladder   . Schizophrenia ([place])   .  Sciatica   . Shortness of breath   . Snoring disorder    Pt stated my boyfriend always wakes me up and tells me to breathe    Current Outpatient Medications on File Prior to Visit  Medication Sig Dispense Refill  . albuterol (PROAIR HFA) 108 (90 Base) MCG/ACT inhaler INHALE TWO PUFFS INTO THE LUNGS EVERY SIX HOURS AS NEEDED FOR WHEEZING OR SHORTNESS OF BREATH (Patient taking differently: Inhale 2 puffs into the lungs every 6 (six) hours as needed for wheezing or shortness of breath. ) 8.5 g 3  . albuterol (PROVENTIL) (2.5 MG/3ML) 0.083% nebulizer solution USE 1 VIAL VIA NEBULIZER Q 6 H  2  . amLODipine (NORVASC) 10 MG tablet Take 1 tablet (10 mg total) daily by mouth. 90 tablet 3  . ARIPiprazole (ABILIFY) 20 MG tablet Take 1 tablet (20 mg total) by mouth at bedtime. (Patient taking differently: Take 20 mg by mouth 2 (two) times daily. ) 30 tablet 0  . budesonide-formoterol (SYMBICORT) 80-4.5 MCG/ACT inhaler Inhale 2 puffs into the lungs 2 (two) times daily. 1 Inhaler 11  . carvedilol (COREG) 25 MG tablet Take 1 tablet (25 mg total) 2 (two) times daily by mouth. 180 tablet 3  . DIOVAN 320 MG tablet  Take 1 tablet (320 mg total) by mouth daily. 30 tablet 5  . EUCRISA 2 % OINT APP EXT AA BID  0  . famotidine (PEPCID) 20 MG tablet One at bedtime 30 tablet 11  . furosemide (LASIX) 80 MG tablet Take 80 mg by mouth 2 (two) times daily.    . hydrOXYzine (ATARAX/VISTARIL) 25 MG tablet TK 1 T PO  TID  0  . pantoprazole (PROTONIX) 40 MG tablet Take 1 tablet (40 mg total) by mouth daily. Take 30-60 min before first meal of the day 30 tablet 11  . Potassium Chloride ER 20 MEQ TBCR Take 20 mEq by mouth 2 (two) times daily. 60 tablet 0  . pregabalin (LYRICA) 150 MG capsule Take 150 mg by mouth daily.    [name] tiZANidine (ZANAFLEX) 4 MG tablet Take 1 tablet by mouth every 8 (eight) hours as needed.  1  . triamcinolone cream (KENALOG) 0.1 % Apply 1 application topically 2 (two) times daily as needed (foot rash). 30  g 2  . [DISCONTINUED] solifenacin (VESICARE) 5 MG tablet Take 5 mg by mouth daily.     No current facility-administered medications on file prior to visit.     Allergies  Allergen Reactions  . Aspirin Nausea And Vomiting    Blood pressure (!) 102/52, pulse 78, SpO2 92 %.  Hypertension Patient blood pressure of 102/52 is significantly lower that all previus readings. She denies symptoms of hypotension or problems with current therapy. Reports BP ~110s/60s at home but no home BP reading are available today for assessment. Will continue current medication without changes and follow up in clinic as needed. Patient encouraged to contact clinic with any problems or questions.   Jacion Dismore Rodriguez-Guzman PharmD, [place] [id number] 08/12/2017 7:47 PM

## 2017-08-12 NOTE — Patient Instructions (Addendum)
Return for a follow up appointment in as needed  Your blood pressure today is 102/52 pulse 78  Check your blood pressure at home daily (if able) and keep record of the readings.  Take your BP meds as follows: *All medication as prescribed*  Bring your BP cuff and your record of home blood pressures to your next appointment.  Exercise as you're able, try to walk approximately 30 minutes per day.  Keep salt intake to a minimum, especially watch canned and prepared boxed foods.  Eat more fresh fruits and vegetables and fewer canned items.  Avoid eating in fast food restaurants.    HOW TO TAKE YOUR BLOOD PRESSURE: . Rest 5 minutes before taking your blood pressure. .  Don't smoke or drink caffeinated beverages for at least 30 minutes before. . Take your blood pressure before (not after) you eat. . Sit comfortably with your back supported and both feet on the floor (don't cross your legs). . Elevate your arm to heart level on a table or a desk. . Use the proper sized cuff. It should fit smoothly and snugly around your bare upper arm. There should be enough room to slip a fingertip under the cuff. The bottom edge of the cuff should be 1 inch above the crease of the elbow. . Ideally, take 3 measurements at one sitting and record the average.

## 2017-08-31 ENCOUNTER — Telehealth: Payer: Self-pay | Admitting: Cardiovascular Disease

## 2017-08-31 MED ORDER — AMLODIPINE BESYLATE 10 MG PO TABS
10.0000 mg | ORAL_TABLET | Freq: Every day | ORAL | 0 refills | Status: DC
Start: 2017-08-31 — End: 2018-05-17

## 2017-08-31 NOTE — Telephone Encounter (Signed)
New Message  Pt c/o medication issue:  1. Name of Medication: Amlodipine   2. How are you currently taking this medication (dosage and times per day)? 10mg    3. Are you having a reaction (difficulty breathing--STAT)? no  4. What is your medication issue? Pt call requesting to speak with RN. She states her medication is prescribed to her for 10mg  but the pharmacy Is only filling for 5mg . Please call back to discuss

## 2017-08-31 NOTE — Telephone Encounter (Signed)
The prescription for Amlodipine 10 mg has been refilled per the patient's request.

## 2017-09-15 DIAGNOSIS — R011 Cardiac murmur, unspecified: Secondary | ICD-10-CM | POA: Insufficient documentation

## 2017-09-15 DIAGNOSIS — I509 Heart failure, unspecified: Secondary | ICD-10-CM | POA: Insufficient documentation

## 2017-09-20 IMAGING — CR DG CHEST 2V
2 series · 2 of 2 positions shown · non-contrast
Comparison: 04/21/2015

CLINICAL DATA: Productive cough for 1 week

EXAM:
CHEST  2 VIEW

[chest pa]
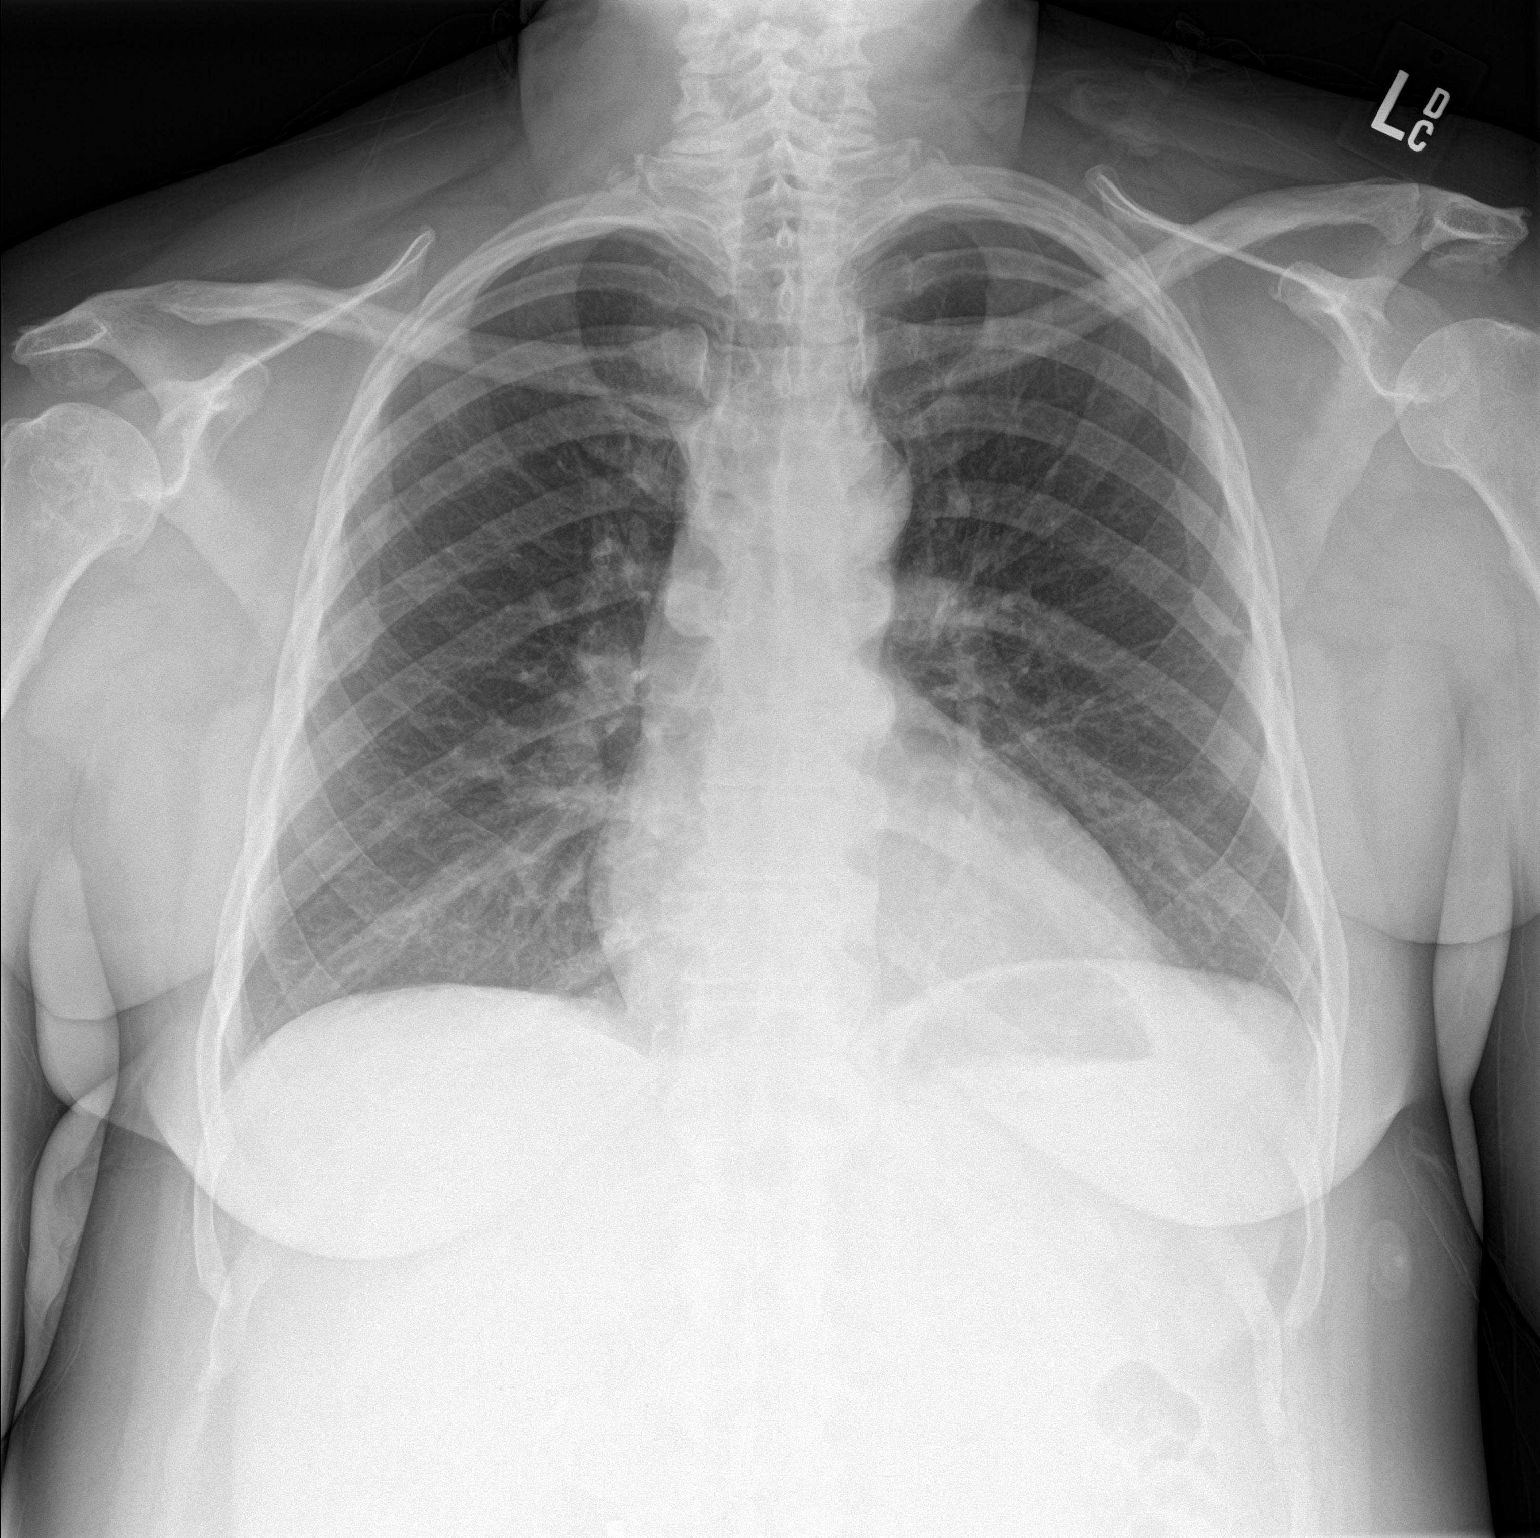

[chest lat]
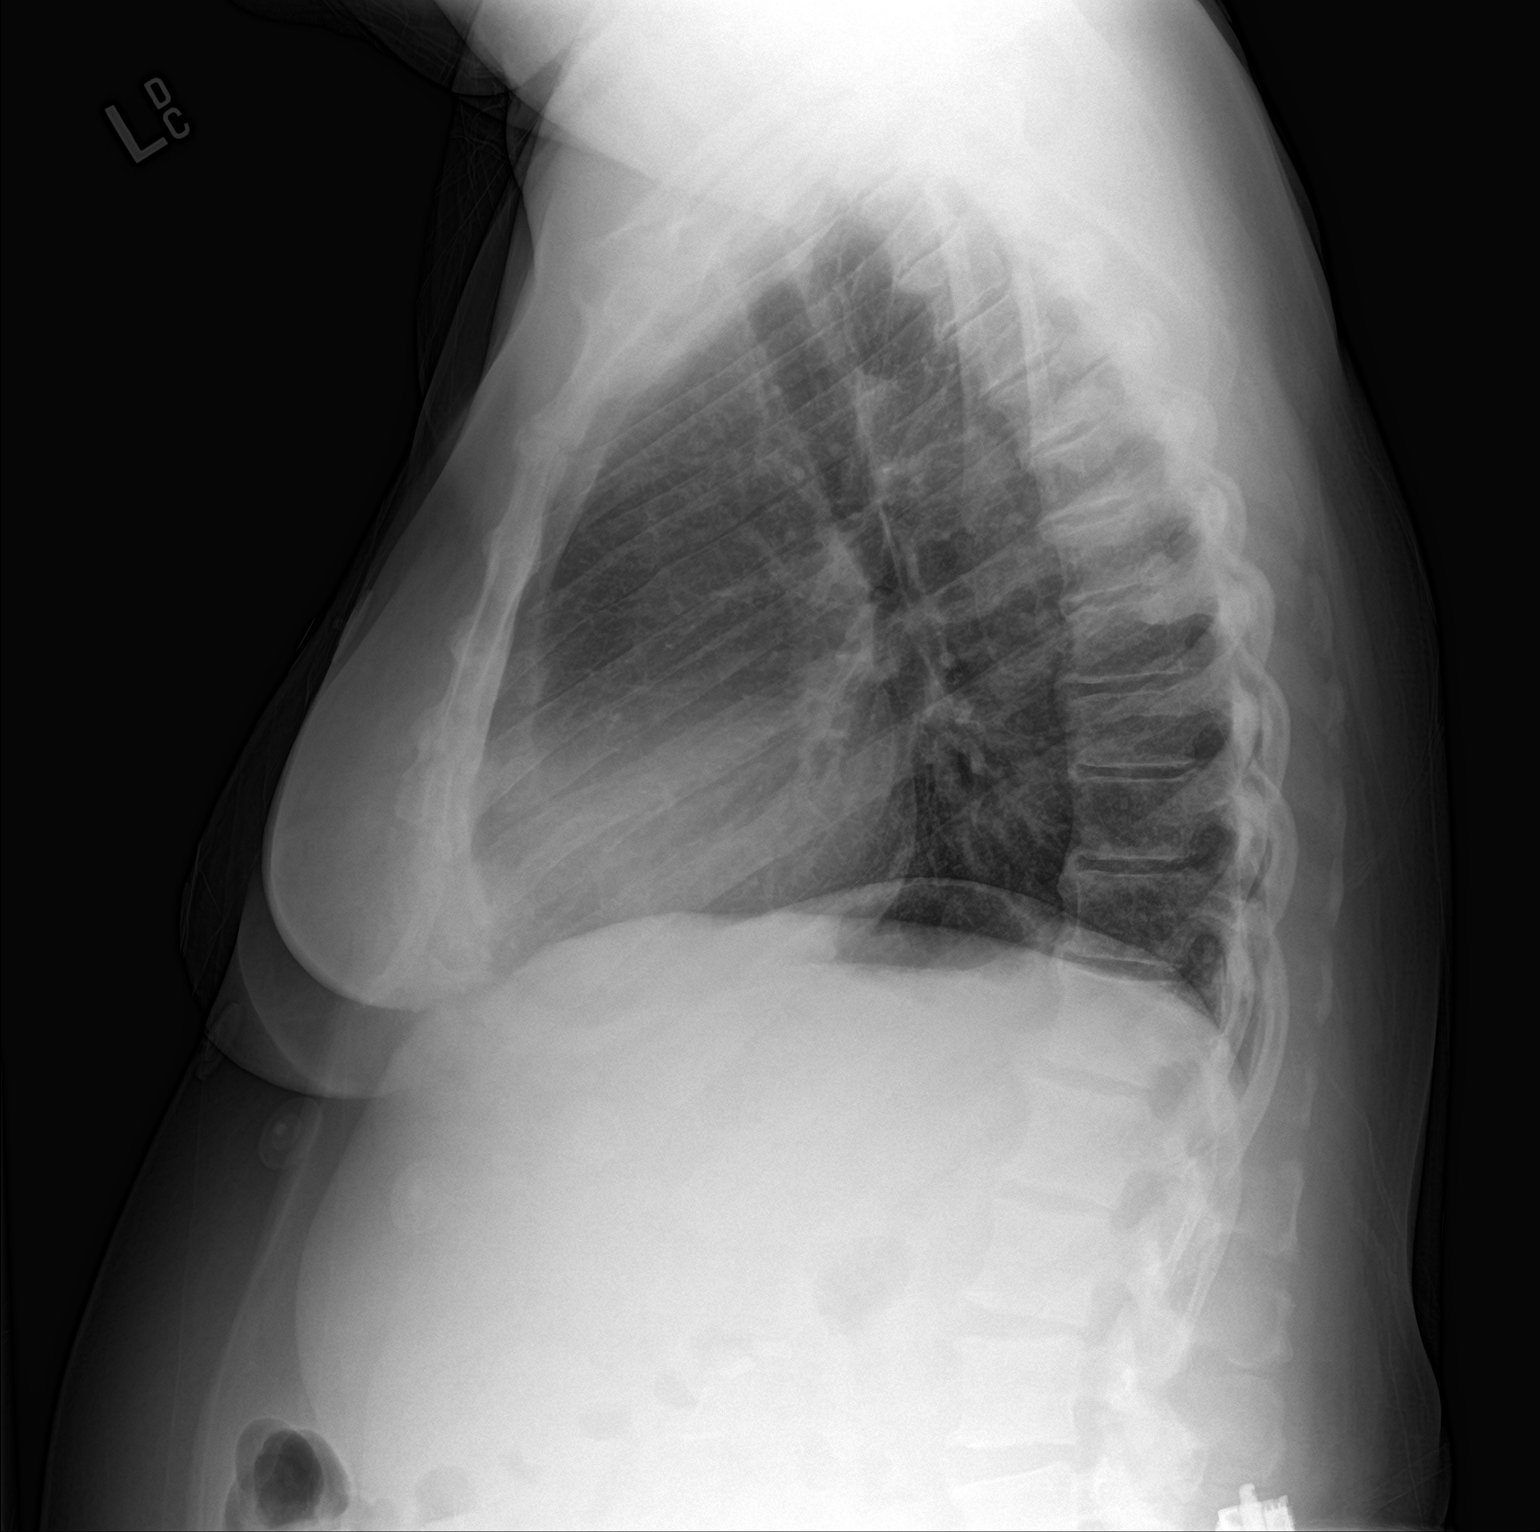

[2 of 2 positions shown; findings below may reference images not displayed]

FINDINGS: Normal heart size and mediastinal contours. No acute infiltrate or
edema. No effusion or pneumothorax. No acute osseous findings.
IMPRESSION: Negative chest

## 2017-09-21 ENCOUNTER — Ambulatory Visit: Payer: Medicaid Other | Admitting: Pulmonary Disease

## 2017-09-21 ENCOUNTER — Ambulatory Visit: Payer: Medicaid Other | Admitting: Internal Medicine

## 2017-10-01 ENCOUNTER — Telehealth: Payer: Self-pay | Admitting: Adult Health

## 2017-10-01 NOTE — Telephone Encounter (Signed)
BiPAP initiated from CPAP titration per 11.15.18 phone note Download received from [facility] Average usage 4hrs 54mins IPAP 16 EPAP 13 AHI 0.2  Per TP: looks great, no changes  Called spoke with patient, discussed download results and TP's recommendations Pt stated she is doing well  Nothing further needed Download sent for scan

## 2017-10-12 ENCOUNTER — Ambulatory Visit: Payer: Medicaid Other | Admitting: Cardiovascular Disease

## 2017-10-12 ENCOUNTER — Ambulatory Visit: Payer: Medicaid Other | Admitting: Pulmonary Disease

## 2017-10-22 ENCOUNTER — Ambulatory Visit: Payer: Medicaid Other | Admitting: Internal Medicine

## 2017-10-28 ENCOUNTER — Other Ambulatory Visit (INDEPENDENT_AMBULATORY_CARE_PROVIDER_SITE_OTHER): Payer: Medicaid Other

## 2017-10-28 ENCOUNTER — Ambulatory Visit (INDEPENDENT_AMBULATORY_CARE_PROVIDER_SITE_OTHER): Payer: Medicaid Other | Admitting: Internal Medicine

## 2017-10-28 ENCOUNTER — Ambulatory Visit (INDEPENDENT_AMBULATORY_CARE_PROVIDER_SITE_OTHER)
Admission: RE | Admit: 2017-10-28 | Discharge: 2017-10-28 | Disposition: A | Discharge status: Home / Self Care | Payer: Medicaid Other | Source: Ambulatory Visit | Attending: Internal Medicine | Admitting: Internal Medicine

## 2017-10-28 ENCOUNTER — Encounter: Payer: Self-pay | Admitting: Internal Medicine

## 2017-10-28 VITALS — BP 124/74 | HR 80 | Ht 62.0 in

## 2017-10-28 DIAGNOSIS — R0609 Other forms of dyspnea: Secondary | ICD-10-CM

## 2017-10-28 DIAGNOSIS — R0600 Dyspnea, unspecified: Secondary | ICD-10-CM

## 2017-10-28 DIAGNOSIS — J4530 Mild persistent asthma, uncomplicated: Secondary | ICD-10-CM

## 2017-10-28 DIAGNOSIS — F17210 Nicotine dependence, cigarettes, uncomplicated: Secondary | ICD-10-CM

## 2017-10-28 LAB — BRAIN NATRIURETIC PEPTIDE: Pro B Natriuretic peptide (BNP): 14 pg/mL (ref 0.0–100.0)

## 2017-10-28 LAB — TSH: TSH: 0.72 u[IU]/mL (ref 0.35–4.50)

## 2017-10-28 LAB — CBC WITH DIFFERENTIAL/PLATELET
Basophils Absolute: 0.1 10*3/uL (ref 0.0–0.1)
Basophils Relative: 1.2 % (ref 0.0–3.0)
Eosinophils Absolute: 0.3 10*3/uL (ref 0.0–0.7)
Eosinophils Relative: 2.8 % (ref 0.0–5.0)
HCT: 40.1 % (ref 36.0–46.0)
Hemoglobin: 13.5 g/dL (ref 12.0–15.0)
Lymphocytes Relative: 24.5 % (ref 12.0–46.0)
Lymphs Abs: 2.5 10*3/uL (ref 0.7–4.0)
MCHC: 33.7 g/dL (ref 30.0–36.0)
MCV: 92.5 fl (ref 78.0–100.0)
Monocytes Absolute: 1.1 10*3/uL — ABNORMAL HIGH (ref 0.1–1.0)
Monocytes Relative: 10.3 % (ref 3.0–12.0)
Neutro Abs: 6.3 10*3/uL (ref 1.4–7.7)
Neutrophils Relative %: 61.2 % (ref 43.0–77.0)
Platelets: 263 10*3/uL (ref 150.0–400.0)
RBC: 4.34 Mil/uL (ref 3.87–5.11)
RDW: 16.4 % — ABNORMAL HIGH (ref 11.5–15.5)
WBC: 10.2 10*3/uL (ref 4.0–10.5)

## 2017-10-28 LAB — BASIC METABOLIC PANEL
BUN: 18 mg/dL (ref 6–23)
CO2: 29 mEq/L (ref 19–32)
Calcium: 9.3 mg/dL (ref 8.4–10.5)
Chloride: 104 mEq/L (ref 96–112)
Creatinine, Ser: 0.82 mg/dL (ref 0.40–1.20)
GFR: 95.85 mL/min (ref >60.00)
Glucose, Bld: 111 mg/dL — ABNORMAL HIGH (ref 70–99)
Potassium: 4.5 mEq/L (ref 3.5–5.1)
Sodium: 137 mEq/L (ref 135–145)

## 2017-10-28 MED ORDER — BUDESONIDE-FORMOTEROL FUMARATE 160-4.5 MCG/ACT IN AERO: 2.0000 | INHALATION_SPRAY | Freq: Two times a day (BID) | RESPIRATORY_TRACT | 0 refills | Status: DC

## 2017-10-28 NOTE — Progress Notes (Signed)
Spoke with pt and notified of results per Dr. Wert. Pt verbalized understanding and denied any questions. 

## 2017-10-28 NOTE — Patient Instructions (Addendum)
Plan A = Automatic = change symbicort to 160 Take 2 puffs first thing in am and then another 2 puffs about 12 hours later.     Work on inhaler technique:  relax and gently blow all the way out then take a nice smooth deep breath back in, triggering the inhaler at same time you start breathing in.  Hold for up to 5 seconds if you can. Blow out thru nose. Rinse and gargle with water when done      Plan B = Backup Only use your albuterol as a rescue medication to be used if you can't catch your breath by resting or doing a relaxed purse lip breathing pattern.  - The less you use it, the better it will work when you need it. - Ok to use the inhaler up to 2 puffs  every 4 hours if you must but call for appointment if use goes up over your usual need - Don't leave home without it !!  (think of it like the spare tire for your car)   Plan C = Crisis - only use your albuterol nebulizer if you first try Plan B and it fails to help > ok to use the nebulizer up to every 4 hours but if start needing it regularly call for immediate appointment    Please remember to go to the lab and x-ray department downstairs in the basement  for your tests - we will call you with the results when they are available.     See [name] NP w/in 2 weeks with all your medications, even over the counter meds, separated in two separate bags, the ones you take no matter what vs the ones you stop once you feel better and take only as needed when you feel you need them.   [name]  will generate for you a new user friendly medication calendar that will put [place] all on the same page re: your medication use.     Without this process, it simply isn't possible to assure that we are providing  your outpatient care  with  the attention to detail we feel you deserve.   If we cannot assure that you're getting that kind of care,  then we cannot manage your problem effectively from this clinic.  Once you have seen [name] and we are sure that we're  all on the same page with your medication use she will arrange follow up with me.  - needs cxr next ov

## 2017-10-28 NOTE — Progress Notes (Addendum)
Subjective:     Patient ID: Lori Bean, female   DOB: [birth year]      MRN: [id number]  HPI  41 yobf MO quit smoking 04/2017 healthy child/ 2 IUP's  With baseline wt < 200 with new doe x 2012 assoc with wt gain gain x 2016 referred to pulmonary clinic 06/04/2017 by Dr   Emilee Hero bonsu   Admit date: 12/17/2016 Discharge date: 12/18/2016  Discharge Diagnoses:  Possible asthma exacerbation Essential hypertension Chronic diastolic heart failure Prediabetes Morbid obesity  History of present illness:  47 y.o.femalewith medical history significant of Asthma and OSA (reversibility demonstrated on PFTs in Feb this year), smoking, HTN, CHF grade 2 diastolic dysfunction. Patient presents to the ED with c/o SOB. Symptoms onset at rest yesterday, also has 10lb weight gain over past 3 weeks but states she is compliant with lasix 80 BID. 2 albuterol nebs before seeing PCP this AM. PCP sent her to ED for evaluation.  Hospital Course:  Possible asthma exacerbation -Patient states that her wheezing has actually improved and she is feeling better today -She recently finished a course of prednisone back in February 2018 -She has been following up with the pulmonary clinic -Chest x-ray -Spoke with Dr. [name], patient's pulmonologist. Of note, progress note from Dr. [name] back in January 2018 stated that he was not convinced of her asthma. At this time, Dr. [name] recommended no change in patient's medications. Follow-up as an outpatient. -Continue Advair Diskus, pro-air, QVAR -Patient was placed on prednisone 40 mg, will continue this for 4 days.  Essential hypertension -Continue amlodipine -Lasix was discontinued as she does complain of chronic cough  Chronic diastolic heart failure -Echocardiogram 05/24/2015 showed EF of 6237%, grade 2 diastolic dysfunction -Currently appears euvolemic and compensated on exam. -BNP 24.8 -Continue home Lasix dose with potassium  supplementation  Prediabetes -Hemoglobin A1c was 6.1 back in 2016. This can be followed and managed as an outpatient by patient's primary care physician.  Morbid obesity -BMI 47.1 -Patient only talk to her primary care physician regarding weight loss and lifestyle modifications. -Patient states she feels that she has gained 10-15 pounds in the past several weeks. States she has been watching what she eats. Admits that she has been using different scales at different doctors offices. Inquires about whether there is a program that offers free scales. Case management stated that this program is no longer available.       06/04/2017 1st   office visit/ Lori Bean  Re transition of care  Chief Complaint  Patient presents with  . Pulmonary Consult    Referred by Dr. Vista Lawman. Pt c/o DOE for approx 6 years, worse over the past wk to the point that she is winded just walking from room to room at home. She states also having some prod cough with large amounts of black colored sputum.  She has CP "stabbing pain" comes and goes and lasts for several minutes. She has a rash on her arm- not improving after seeing derm. She has also noticed blurred vision. She is using her proair inhaler 3-4 x per day.   first started on proair helped some, then symbicort sev months prior to OV>  Not much response, even neb now doesn't always help. 6 years prior to OV  Trouble flat and x 6 months sleeping upright due to sob immediately on lying back Doe progressive   But esp over the last week to room to room now Coughing more since 2 months > black mucus  assoc with cp with coughing  rec Plan A = Automatic = symbicort 80 Take 2 puffs first thing in am and then another 2 puffs about 12 hours later.  Pantoprazole (protonix) 40 mg (or prilosec 20 x 2)    Take  30-60 min before first meal of the day and Pepcid (famotidine)  20 mg one @  bedtime until return to office - this is the best way to tell whether stomach acid is  contributing to your problem.   Plan B = Backup Only use your albuterol as a rescue medication Plan C = Nebulizer  GERD diet   07/22/18 NP eval Lori Bean  Continue on Symbicort Twice daily  . Rinse after use.  Work on not smoking    10/28/2017  f/u ov/Lori Bean re: sob/ ? How much is asthma / still smoking  Chief Complaint  Patient presents with  . Follow-up    Breathing has been worse for the past wk- SOB walking from room to room. She is also c/o wheezing and some chest tightness. She is using her albuterol inhaler 4-5 x per day on average.   Dyspnea: room to room x one week Cough: more than usual x one week thick clear/ assoc st and dysphagia ? On ppi ac ?  Sleep: ok on cpap s 02   Using saba q am instead of prn  Midline pain with coughing only    No  obvious day to day or daytime variability or assoc  purulent sputum or mucus plugs or hemoptysis  or overt sinus or hb symptoms. No unusual exposure hx or h/o childhood pna/ asthma or knowledge of premature birth.  Sleeping ok flat on cpap s 02 without nocturnal  or early am exacerbation  of respiratory  c/o's or need for noct saba. Also denies any obvious fluctuation of symptoms with weather or environmental changes or other aggravating or alleviating factors except as outlined above   Current Allergies, Complete Past Medical History, Past Surgical History, Family History, and Social History were reviewed in [employer] record.  ROS  The following are not active complaints unless bolded Hoarseness, sore throat, dysphagia, dental problems, itching, sneezing,  nasal congestion or discharge of excess mucus or purulent secretions, ear ache,   fever, chills, sweats, unintended wt loss or wt gain, classically pleuritic or exertional cp,  orthopnea pnd or leg swelling, presyncope, palpitations, abdominal pain, anorexia, nausea, vomiting, diarrhea  or change in bowel habits or change in bladder habits, change in stools or change  in urine, dysuria, hematuria,  rash, arthralgias, visual complaints, headache, numbness, weakness or ataxia or problems with walking or coordination,  change in mood/affect or memory.        Current Meds  Medication Sig  . albuterol (PROAIR HFA) 108 (90 Base) MCG/ACT inhaler INHALE TWO PUFFS INTO THE LUNGS EVERY SIX HOURS AS NEEDED FOR WHEEZING OR SHORTNESS OF BREATH (Patient taking differently: Inhale 2 puffs into the lungs every 6 (six) hours as needed for wheezing or shortness of breath. )  . albuterol (PROVENTIL) (2.5 MG/3ML) 0.083% nebulizer solution USE 1 VIAL VIA NEBULIZER Q 6 H  . amLODipine (NORVASC) 10 MG tablet Take 1 tablet (10 mg total) by mouth daily.  . ARIPiprazole (ABILIFY) 20 MG tablet Take 1 tablet (20 mg total) by mouth at bedtime. (Patient taking differently: Take 20 mg by mouth 2 (two) times daily. )  . budesonide-formoterol (SYMBICORT) 80-4.5 MCG/ACT inhaler Inhale 2 puffs into the lungs 2 (two) times  daily.  [name] DIOVAN 320 MG tablet Take 1 tablet (320 mg total) by mouth daily.  . EUCRISA 2 % OINT APP EXT AA BID  . famotidine (PEPCID) 20 MG tablet One at bedtime  . furosemide (LASIX) 80 MG tablet Take 80 mg by mouth 2 (two) times daily.  . hydrOXYzine (ATARAX/VISTARIL) 25 MG tablet TK 1 T PO  TID  . pantoprazole (PROTONIX) 40 MG tablet Take 1 tablet (40 mg total) by mouth daily. Take 30-60 min before first meal of the day  . Potassium Chloride ER 20 MEQ TBCR Take 20 mEq by mouth 2 (two) times daily.  . pregabalin (LYRICA) 150 MG capsule Take 150 mg by mouth daily.  [name] tiZANidine (ZANAFLEX) 4 MG tablet Take 1 tablet by mouth every 8 (eight) hours as needed.  . triamcinolone cream (KENALOG) 0.1 % Apply 1 application topically 2 (two) times daily as needed (foot rash).           Objective:   Physical Exam    amb obese bf nad  10/28/2017       252   06/04/17 250 lb (113.4 kg)  05/14/17 250 lb 12.8 oz (113.8 kg)  05/06/17 242 lb (109.8 kg)    Vital signs reviewed -  Note on arrival 02 sats  97% on RA         HEENT: nl dentition, turbinates bilaterally, and oropharynx. Nl external ear canals without cough reflex   NECK :  without JVD/Nodes/TM/ nl carotid upstrokes bilaterally   LUNGS: no acc muscle use,  Nl contour chest with end exp distant wheezes  Bilaterally with  cough on exp    CV:  RRR  no s3 or murmur or increase in P2, and  Pos pitting both feet  ABD:  soft and nontender with nl inspiratory excursion in the supine position. No bruits or organomegaly appreciated, bowel sounds nl  MS:  Nl gait/ ext warm without deformities, calf tenderness, cyanosis or clubbing No obvious joint restrictions   SKIN: warm and dry without lesions    NEURO:  alert, approp, nl sensorium with  no motor or cerebellar deficits apparent.        CXR PA and Lateral:   10/28/2017 :    I personally reviewed images and agree with radiology impression as follows:   Chronic bronchitic-smoking related changes. New subsegmental atelectasis or early pneumonia in the right lower lobe. My review:  No evidence pna   Labs ordered/ reviewed:      Chemistry      Component Value Date/Time   NA 137 10/28/2017 1039   K 4.5 10/28/2017 1039   CL 104 10/28/2017 1039   CO2 29 10/28/2017 1039   BUN 18 10/28/2017 1039   CREATININE 0.82 10/28/2017 1039   CREATININE 0.62 10/09/2016 0857      Component Value Date/Time   CALCIUM 9.3 10/28/2017 1039                             Lab Results  Component Value Date   WBC 10.2 10/28/2017   HGB 13.5 10/28/2017   HCT 40.1 10/28/2017   MCV 92.5 10/28/2017   PLT 263.0 10/28/2017       EOS                      0.3  10/28/2017       Lab Results  Component Value Date   TSH 0.72 10/28/2017     Lab Results  Component Value Date   PROBNP 14.0 10/28/2017                   Assessment:

## 2017-10-29 ENCOUNTER — Encounter: Payer: Self-pay | Admitting: Internal Medicine

## 2017-10-29 ENCOUNTER — Other Ambulatory Visit: Payer: Self-pay

## 2017-10-29 DIAGNOSIS — R05 Cough: Secondary | ICD-10-CM

## 2017-10-29 DIAGNOSIS — R059 Cough, unspecified: Secondary | ICD-10-CM

## 2017-10-29 NOTE — Assessment & Plan Note (Signed)
See pfts 11/10/16 c/w mild asthma  - 10/28/2017   Walked RA  2 laps @ 185 ft each stopped due to  Sob/ no desats at nl pace   No evidence of thyroid dz/ anemia/ chf so likely mostly related to obesity with only mild asthma component  (see separate a/p)

## 2017-10-29 NOTE — Assessment & Plan Note (Signed)

## 2017-10-29 NOTE — Assessment & Plan Note (Signed)
Body mass index is 46.82 kg/m.  -  trending up still  Lab Results  Component Value Date   TSH 0.72 10/28/2017     Contributing to gerd risk/ doe/reviewed the need and the process to achieve and maintain neg calorie balance > defer f/u primary care including intermittently monitoring thyroid status

## 2017-10-29 NOTE — Assessment & Plan Note (Signed)
PFT's  11/10/2016  FEV1 2.06 (92 % ) ratio 81   p 24 % improvement from saba p ? prior to study with DLCO  93/94 % corrects to 102  % for alv volume   - 06/04/2017  After extensive coaching HFA effectiveness =    75% > try symbicort 80 2bid  - Spirometry 10/28/2017  FEV1 1.01 (45%)  Ratio 67  p am symb/saba/smoking  - 10/28/2017  After extensive coaching inhaler device  effectiveness =    75% try symb 160 2bid    Not adequate control on symb 80 so try 160 2 bid and then return with all meds in hand using a trust but verify approach to confirm accurate Medication  Reconciliation The principal here is that until we are certain that the  patients are doing what we've asked, it makes no sense to ask them to do more.    Obviously needs also to quit smoking   I had an extended discussion with the patient reviewing all relevant studies completed to date and  lasting 15 to 20 minutes of a 25 minute visit    Each maintenance medication was reviewed in detail including most importantly the difference between maintenance and prns and under what circumstances the prns are to be triggered using an action plan format that is not reflected in the computer generated alphabetically organized AVS.    Please see AVS for specific instructions unique to this visit that I personally wrote and verbalized to the the pt in detail and then reviewed with pt  by my nurse highlighting any  changes in therapy recommended at today's visit to their plan of care.

## 2017-11-03 ENCOUNTER — Telehealth: Payer: Self-pay | Admitting: Pulmonary Disease

## 2017-11-03 MED ORDER — AMOXICILLIN-POT CLAVULANATE 875-125 MG PO TABS: 1.0000 | ORAL_TABLET | Freq: Two times a day (BID) | ORAL | 0 refills | Status: DC

## 2017-11-03 NOTE — Telephone Encounter (Signed)
MW please advise.  Thanks.  

## 2017-11-03 NOTE — Telephone Encounter (Signed)
Called and spoke with pt who states she has a horrible headache, has complaints of postnasal drainage and chest congestion, coughing with gray mucus.  Pt also has complaints of body aches, chills, and believes she is running a fever but is unsure due to not owning a thermometer.  Dr. [name], please advise on recommendations for pt.  Thanks!

## 2017-11-03 NOTE — Telephone Encounter (Signed)
Pt called back checking status of recs.  I advised that we were waiting on RA's recs and assured her that we would be calling before we leave today with his recs.  Pt expressed understanding.  RA please advise on recs.  Thanks.

## 2017-11-03 NOTE — Telephone Encounter (Signed)
MW pt

## 2017-11-03 NOTE — Telephone Encounter (Signed)
Pt is calling back. Cb is [phone].

## 2017-11-03 NOTE — Telephone Encounter (Signed)
Augmentin 875 mg take one pill twice daily  X 10 days - take at breakfast and supper with large glass of water.  It would help reduce the usual side effects (diarrhea and yeast infections) if you ate cultured yogurt at lunch.   Sinus CT at end of rx if not all better

## 2017-11-03 NOTE — Telephone Encounter (Signed)
Pt aware of recs.  rx sent to preferred pharmacy.  Nothing further needed.  

## 2017-11-04 ENCOUNTER — Emergency Department (HOSPITAL_COMMUNITY)
Admission: EM | Admit: 2017-11-04 | Discharge: 2017-11-04 | Disposition: A | Discharge status: Home / Self Care | Payer: Medicaid Other | Attending: Emergency Medicine | Admitting: Emergency Medicine

## 2017-11-04 ENCOUNTER — Emergency Department (HOSPITAL_COMMUNITY): Payer: Medicaid Other

## 2017-11-04 ENCOUNTER — Other Ambulatory Visit: Payer: Self-pay

## 2017-11-04 DIAGNOSIS — I509 Heart failure, unspecified: Secondary | ICD-10-CM | POA: Insufficient documentation

## 2017-11-04 DIAGNOSIS — I110 Hypertensive heart disease with heart failure: Secondary | ICD-10-CM | POA: Insufficient documentation

## 2017-11-04 DIAGNOSIS — R0602 Shortness of breath: Secondary | ICD-10-CM | POA: Diagnosis present

## 2017-11-04 DIAGNOSIS — F17210 Nicotine dependence, cigarettes, uncomplicated: Secondary | ICD-10-CM | POA: Insufficient documentation

## 2017-11-04 DIAGNOSIS — J4531 Mild persistent asthma with (acute) exacerbation: Secondary | ICD-10-CM | POA: Diagnosis not present

## 2017-11-04 DIAGNOSIS — J101 Influenza due to other identified influenza virus with other respiratory manifestations: Secondary | ICD-10-CM | POA: Diagnosis not present

## 2017-11-04 DIAGNOSIS — Z79899 Other long term (current) drug therapy: Secondary | ICD-10-CM | POA: Insufficient documentation

## 2017-11-04 LAB — BASIC METABOLIC PANEL
Anion gap: 9 (ref 5–15)
BUN: 9 mg/dL (ref 6–20)
CO2: 24 mmol/L (ref 22–32)
Calcium: 8.8 mg/dL — ABNORMAL LOW (ref 8.9–10.3)
Chloride: 101 mmol/L (ref 101–111)
Creatinine, Ser: 0.69 mg/dL (ref 0.44–1.00)
GFR calc Af Amer: >60 mL/min (ref >60)
GFR calc non Af Amer: >60 mL/min (ref >60)
Glucose, Bld: 110 mg/dL — ABNORMAL HIGH (ref 65–99)
Potassium: 3.7 mmol/L (ref 3.5–5.1)
Sodium: 134 mmol/L — ABNORMAL LOW (ref 135–145)

## 2017-11-04 LAB — INFLUENZA PANEL BY PCR (TYPE A & B)
Influenza A By PCR: POSITIVE — AB
Influenza B By PCR: NEGATIVE

## 2017-11-04 LAB — CBC WITH DIFFERENTIAL/PLATELET
Basophils Absolute: 0 10*3/uL (ref 0.0–0.1)
Basophils Relative: 0 %
Eosinophils Absolute: 0.1 10*3/uL (ref 0.0–0.7)
Eosinophils Relative: 1 %
HCT: 38.4 % (ref 36.0–46.0)
Hemoglobin: 12.9 g/dL (ref 12.0–15.0)
Lymphocytes Relative: 8 %
Lymphs Abs: 0.7 10*3/uL (ref 0.7–4.0)
MCH: 31 pg (ref 26.0–34.0)
MCHC: 33.6 g/dL (ref 30.0–36.0)
MCV: 92.3 fL (ref 78.0–100.0)
Monocytes Absolute: 1.2 10*3/uL — ABNORMAL HIGH (ref 0.1–1.0)
Monocytes Relative: 14 %
Neutro Abs: 6.6 10*3/uL (ref 1.7–7.7)
Neutrophils Relative %: 77 %
Platelets: 226 10*3/uL (ref 150–400)
RBC: 4.16 MIL/uL (ref 3.87–5.11)
RDW: 15.1 % (ref 11.5–15.5)
WBC: 8.6 10*3/uL (ref 4.0–10.5)

## 2017-11-04 MED ORDER — FLUTICASONE PROPIONATE 50 MCG/ACT NA SUSP
2.0000 | Freq: Every day | NASAL | Status: DC
  Administered 2017-11-04: 2 via NASAL
  Filled 2017-11-04: qty 16

## 2017-11-04 MED ORDER — ACETAMINOPHEN 500 MG PO TABS
1000.0000 mg | ORAL_TABLET | Freq: Once | ORAL | Status: AC
  Administered 2017-11-04: 1000 mg via ORAL
  Filled 2017-11-04: qty 2

## 2017-11-04 MED ORDER — MAGNESIUM SULFATE 50 % IJ SOLN: 2.0000 g | Freq: Once | INTRAMUSCULAR | Status: DC

## 2017-11-04 MED ORDER — METHYLPREDNISOLONE SODIUM SUCC 125 MG IJ SOLR
125.0000 mg | Freq: Once | INTRAMUSCULAR | Status: AC
  Administered 2017-11-04: 125 mg via INTRAVENOUS
  Filled 2017-11-04: qty 2

## 2017-11-04 MED ORDER — OSELTAMIVIR PHOSPHATE 75 MG PO CAPS: 75.0000 mg | ORAL_CAPSULE | Freq: Two times a day (BID) | ORAL | 0 refills | Status: AC

## 2017-11-04 MED ORDER — PROMETHAZINE-CODEINE 6.25-10 MG/5ML PO SYRP
5.0000 mL | ORAL_SOLUTION | Freq: Once | ORAL | Status: AC
  Administered 2017-11-04: 5 mL via ORAL
  Filled 2017-11-04: qty 5

## 2017-11-04 MED ORDER — PREDNISONE 20 MG PO TABS: 40.0000 mg | ORAL_TABLET | Freq: Every day | ORAL | 0 refills | Status: AC

## 2017-11-04 MED ORDER — MAGNESIUM SULFATE 2 GM/50ML IV SOLN
2.0000 g | Freq: Once | INTRAVENOUS | Status: AC
  Administered 2017-11-04: 2 g via INTRAVENOUS
  Filled 2017-11-04: qty 50

## 2017-11-04 NOTE — ED Triage Notes (Signed)
Pt arrived via EMS from home. Pt is alert and oriented x 4 and is verbally responsive. Pt recently dx for sinus infection and has been placed on Amoxicillin. Pt is now c/o SOB with increased congestion, with inspiratory chest pain.  Pt was given albuterol neb treatment as per EMS had relief. .    20G Left AC.  V/s BP 127/76 HR  100  RR 26 Temp per EMS 105.1

## 2017-11-04 NOTE — ED Provider Notes (Addendum)
[facility] DEPT Provider Note   CSN: [id number] Arrival date & time: 11/04/17  1156     History   Chief Complaint Chief Complaint  Patient presents with  . Shortness of Breath    HPI Lori Bean is a 47 y.o. female with PMH of asthma, OSA, HTN, and CHF grade 2 diastolic dysfunction presenting with acute onset shortness of breath.  Patient states she had acute onset nasal congestion, sore throat, cough, and bilateral ear pain starting yesterday. She called her Pulmonologist Dr. Melvyn Novas, who prescribed her Augmentin. She states this morning when walking to the bathroom she got very short of breath and felt lightheaded like she was going to pass out. She did not pass out or lose consciousness. She tried taking her albuterol inhaler, which did not relieve her symptoms. She called EMS and received an albuterol nebulizer treatment en route to the ED, which helped her SOB. She is also complaining of substernal inspiratory chest pain, not associated with cough.    She denies nausea or vomiting. Had an episode of diarrhea today. She endorses headache, nasal congestion, sore throat, ear pain, body aches, fevers, and chills. She also is complaining of increased leg swelling and tightness around her feet. Reports compliance with lasix 80 mg daily. States she has baseline lower extremity swelling, but it is worse.   Patient has never been intubated for an asthma exacerbation.       Past Medical History:  Diagnosis Date  . Anginal pain ([place])    occ from asthma  . Anxiety   . Arthritis   . Asthma   . Bipolar affective disorder ([place])    Schizophrenia  . Bronchitis    hx of  . CHF (congestive heart failure) ([place])   . Depression   . GERD (gastroesophageal reflux disease)   . Heart murmur   . Hypertension   . Neuropathy    left leg , "from back surgery"  . Overactive bladder   . Schizophrenia ([place])   . Sciatica   . Shortness of breath   . Snoring  disorder    Pt stated my boyfriend always wakes me up and tells me to breathe    Patient Active Problem List   Diagnosis Date Noted  . Heart murmur   . CHF (congestive heart failure) ([place])   . Chronic asthma, mild persistent, uncomplicated 71/02/2693  . Dyspnea on exertion 05/05/2017  . Asthma exacerbation in COPD ([place]) 05/05/2017  . Morbid obesity due to excess calories ([place]) 05/05/2017  . Asthma exacerbation 12/17/2016  . 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 Synovial plica of right knee 85/46/2703  . Tear of medial meniscus of right knee, initial encounter 09/28/2016  . OSA (obstructive sleep apnea) 09/23/2016  . Chronic diastolic heart failure ([place]) 04/22/2016  . Prediabetes 04/22/2016  . Cigarette smoker 04/03/2014  . Obesity (BMI 30-39.9) 04/03/2014  . Chest pain 04/03/2014  . Mild cardiomegaly 02/21/2014  . Lumbar spondylosis 12/28/2013  . Extrinsic asthma 06/07/2013  . Anxiety   . Hypertension   . Bipolar affective disorder ([place])   . GERD (gastroesophageal reflux disease)   . Arthritis   . Schizophrenia ([place])   . Overactive bladder     Past Surgical History:  Procedure Laterality Date  . BACK SURGERY     3 back surgeries  . CHOLECYSTECTOMY    . COLONOSCOPY WITH PROPOFOL N/A 01/23/2016   Procedure: COLONOSCOPY WITH PROPOFOL;  Surgeon: Wonda Horner, MD;  Location: [facility];  Service: Endoscopy;  Laterality: N/A;  . EYE SURGERY     Metal plate in right eye. Had fracture in right eye  . gallstones reomved    . KNEE ARTHROSCOPY WITH MENISCAL REPAIR Right 09/28/2016   Procedure: KNEE ARTHROSCOPY WITH MENISCAL REPAIR;  Surgeon: Dorna Leitz, MD;  Location: [facility];  Service: Orthopedics;  Laterality: Right;  Right partial meniscectomy and chondroplasty, patellar/femoral joint and medial femoral condyle   . LUMBAR LAMINECTOMY/DECOMPRESSION MICRODISCECTOMY  04/01/2012   Procedure: LUMBAR LAMINECTOMY/DECOMPRESSION MICRODISCECTOMY 2 LEVELS;  Surgeon: Faythe Ghee, MD;   Location: [facility];  Service: Neurosurgery;  Laterality: Left;  Lumbar four-five, lumbar five sacral one microdiscectomy   . LUMBAR WOUND DEBRIDEMENT  04/29/2012   Procedure: LUMBAR WOUND DEBRIDEMENT;  Surgeon: Faythe Ghee, MD;  Location: [facility];  Service: Neurosurgery;  Laterality: N/A;  lumbar wound debridement  . ROTATOR CUFF REPAIR     Right shoulder  . TUBAL LIGATION      OB History    No data available       Home Medications    Prior to Admission medications   Medication Sig Start Date End Date Taking? Authorizing Provider  albuterol (PROAIR HFA) 108 (90 Base) MCG/ACT inhaler INHALE TWO PUFFS INTO THE LUNGS EVERY SIX HOURS AS NEEDED FOR WHEEZING OR SHORTNESS OF BREATH Patient taking differently: Inhale 2 puffs into the lungs every 6 (six) hours as needed for wheezing or shortness of breath.  11/07/15  Yes Lance Bosch, NP  albuterol (PROVENTIL) (2.5 MG/3ML) 0.083% nebulizer solution USE 1 VIAL VIA NEBULIZER Q 6 H 04/16/17  Yes [provider]  amLODipine (NORVASC) 10 MG tablet Take 1 tablet (10 mg total) by mouth daily. 08/31/17 11/29/17 Yes [name], MD  ARIPiprazole (ABILIFY) 20 MG tablet Take 1 tablet (20 mg total) by mouth at bedtime. Patient taking differently: Take 20 mg by mouth 2 (two) times daily.  03/04/15  Yes Lance Bosch, NP  budesonide-formoterol (SYMBICORT) 160-4.5 MCG/ACT inhaler Inhale 2 puffs into the lungs 2 (two) times daily. 10/28/17  Yes Tanda Rockers, MD  carvedilol (COREG) 25 MG tablet Take 1 tablet (25 mg total) 2 (two) times daily by mouth. 07/20/17 11/04/17 Yes [name], MD  DIOVAN 320 MG tablet Take 1 tablet (320 mg total) by mouth daily. 04/07/17  Yes [name], MD  EUCRISA 2 % OINT APP EXT AA BID 05/11/17  Yes [provider]  famotidine (PEPCID) 20 MG tablet One at bedtime 06/04/17  Yes Tanda Rockers, MD  furosemide (LASIX) 80 MG tablet Take 80 mg by mouth 2 (two) times daily.   Yes [provider]  hydrOXYzine (ATARAX/VISTARIL) 25 MG tablet TK 1 T PO  TID 05/11/17  Yes [provider]  pantoprazole (PROTONIX) 40 MG tablet Take 1 tablet (40 mg total) by mouth daily. Take 30-60 min before first meal of the day 06/04/17  Yes Tanda Rockers, MD  Potassium Chloride ER 20 MEQ TBCR Take 20 mEq by mouth 2 (two) times daily. 12/18/16  Yes Mikhail, Maryann, DO  pregabalin (LYRICA) 150 MG capsule Take 150 mg by mouth daily.   Yes [provider]  tiZANidine (ZANAFLEX) 4 MG tablet Take 1 tablet by mouth every 8 (eight) hours as needed. 03/12/17  Yes [provider]  triamcinolone cream (KENALOG) 0.1 % Apply 1 application topically 2 (two) times daily as needed (foot rash). 11/07/15  Yes Lance Bosch, NP  oseltamivir (TAMIFLU) 75 MG  capsule Take 1 capsule (75 mg total) by mouth 2 (two) times daily for 5 days. 11/04/17 11/09/17  Melanee Spry, MD  predniSONE (DELTASONE) 20 MG tablet Take 2 tablets (40 mg total) by mouth daily with breakfast for 4 days. 11/05/17 11/09/17  Melanee Spry, MD  solifenacin (VESICARE) 5 MG tablet Take 5 mg by mouth daily.  04/22/13  [provider]    Family History Family History  Problem Relation Age of Onset  . Diabetes Mother   . Hypertension Mother   . Diabetes Father   . Heart disease Paternal Aunt   . Cancer Paternal Aunt     Social History Social History   Tobacco Use  . Smoking status: Current Every Day Smoker    Packs/day: 0.50    Years: 24.00    Pack years: 12.00    Types: Cigarettes  . Smokeless tobacco: Never Used  Substance Use Topics  . Alcohol use: No    Alcohol/week: 0.0 oz    Comment: quit Nov. 2014  . Drug use: No     Allergies   Aspirin   Review of Systems Review of Systems  Constitutional: Positive for chills and fever.  HENT: Positive for rhinorrhea and sore throat.   Respiratory: Positive for chest tightness, shortness of breath and wheezing.   Cardiovascular: Positive  for chest pain.  Gastrointestinal: Positive for diarrhea. Negative for nausea and vomiting.  Genitourinary: Negative.   Musculoskeletal: Positive for myalgias.  Neurological: Negative.   Psychiatric/Behavioral: Negative.      Physical Exam Updated Vital Signs BP 126/75   Pulse (!) 103   Temp (!) 100.6 F (38.1 C) (Oral)   Resp 20   Ht 5\' 2"  (1.575 m)   Wt 111.6 kg (246 lb)   SpO2 100%   BMI 44.99 kg/m   Physical Exam  Constitutional: She appears well-developed and well-nourished. She appears distressed.  HENT:  Head: Normocephalic and atraumatic.  Mouth/Throat: Oropharynx is clear and moist.  Eyes: Conjunctivae are normal.  Neck: Normal range of motion. Neck supple.  Cardiovascular: Regular rhythm. Tachycardia present.  Pulmonary/Chest: Effort normal. She has wheezes (End expiratory wheeze throughout all lung fields).  Abdominal: Soft. Bowel sounds are normal. There is no tenderness.  Musculoskeletal: She exhibits edema (bilateral LE, nonpitting ).  No unilateral erythema or swelling in lower extremities. +TTP throughout entire lower extremity bilaterally.   Lymphadenopathy:    She has cervical adenopathy.  Skin: Skin is warm and dry. No erythema.     ED Treatments / Results  Labs (all labs ordered are listed, but only abnormal results are displayed) Labs Reviewed  CBC WITH DIFFERENTIAL/PLATELET - Abnormal; Notable for the following components:      Result Value   Monocytes Absolute 1.2 (*)    All other components within normal limits  BASIC METABOLIC PANEL - Abnormal; Notable for the following components:   Sodium 134 (*)    Glucose, Bld 110 (*)    Calcium 8.8 (*)    All other components within normal limits  INFLUENZA PANEL BY PCR (TYPE A & B) - Abnormal; Notable for the following components:   Influenza A By PCR POSITIVE (*)    All other components within normal limits    EKG  EKG Interpretation  Date/Time:  Thursday November 04 2017 12:35:38  EST Ventricular Rate:  99 PR Interval:    QRS Duration: 94 QT Interval:  327 QTC Calculation: 420 R Axis:   8 Text Interpretation:  Age not  entered, assumed to be  47 years old for purpose of ECG interpretation Sinus rhythm Borderline T abnormalities, anterior leads Abnormal ekg Confirmed by Carmin Muskrat [phone]) on 11/04/2017 1:16:36 PM       Radiology Dg Chest 2 View  Result Date: 11/04/2017 CLINICAL DATA:  Shortness of Breath EXAM: CHEST  2 VIEW COMPARISON:  October 28, 2017 FINDINGS: There is no edema or consolidation. Heart is upper normal in size with pulmonary vascularity within normal limits. No adenopathy. There is degenerative change in thoracic spine. IMPRESSION: No edema or consolidation.  Stable cardiac silhouette. Electronically Signed   By: Lowella Grip III M.D.   On: 11/04/2017 14:11    Procedures Procedures (including critical care time)  Medications Ordered in ED Medications  fluticasone (FLONASE) 50 MCG/ACT nasal spray 2 spray (2 sprays Each Nare Given 11/04/17 1446)  acetaminophen (TYLENOL) tablet 1,000 mg (1,000 mg Oral Given 11/04/17 1409)  methylPREDNISolone sodium succinate (SOLU-MEDROL) 125 mg/2 mL injection 125 mg (125 mg Intravenous Given 11/04/17 1409)  magnesium sulfate IVPB 2 g 50 mL (2 g Intravenous New Bag/Given 11/04/17 1409)  promethazine-codeine (PHENERGAN with CODEINE) 6.25-10 MG/5ML syrup 5 mL (5 mLs Oral Given 11/04/17 1446)     Initial Impression / Assessment and Plan / ED Course  I have reviewed the triage vital signs and the nursing notes.  Pertinent labs & imaging results that were available during my care of the patient were reviewed by me and considered in my medical decision making (see chart for details).   47 yo female with PMH of asthma, chronic diastolic HF, and HTN presenting for shortness of breath in the setting of flu-like symptoms for one day duration. Patient febrile to 100.6, tachycardic to 106, BP 145/63, saturating 100%  on RA. Lung exam with end expiratory wheezing throughout all lung fields. Patient likely having mild asthma exacerbation in setting of flu-like symptoms.   CBC within normal limits. CMET with mild hyponatremia 134, otherwise within normal limits. CXR negative for edema or consolidation. Given solumedrol and magnesium for asthma exacerbation. Influenza PCR pending.   Tachycardia and fever improved after receiving 1000 mg of tylenol. Influenza PCR positive for Influenza A.  Patient is stable for discharge. Will discharge with steroids for 4 more days. Patient has albuterol inhaler and nebulizer at home. Discussed she should continue Symbicort. Will also prescribe Tamfilu as patient's onset of symptoms are within treatment window. Discussed with patient she can discontinue antibioticus as they will likely have no benefit as her symptoms are secondary to influenza A.   Final Clinical Impressions(s) / ED Diagnoses   Final diagnoses:  Mild persistent asthma with exacerbation  Influenza A    ED Discharge Orders        Ordered    predniSONE (DELTASONE) 20 MG tablet  Daily with breakfast     11/04/17 1552    oseltamivir (TAMIFLU) 75 MG capsule  2 times daily     11/04/17 1600       Melanee Spry, MD 11/04/17 3846    Melanee Spry, MD 11/04/17 1603    Carmin Muskrat, MD 11/07/17 2043

## 2017-11-04 NOTE — ED Notes (Signed)
Pt appears to have congestion with inspiratory wheezes and chest tightness that worsens with coughing. Pt reports that she has a productive cough of thick grey sputum/ Pt reports that's she is a smoker. Pt O2 saturation 100% RA.

## 2017-11-04 NOTE — Discharge Instructions (Signed)
Lori Bean,   Your flu test was positive for Influenza A. I have prescribed you Tamiflu. Please take Tamiflu 75 mg twice daily for 5 days.   Your labs and chest xray were reassuring. You have no signs of pneumonia or pulmonary edema.   I believe your shortness of breath was due to a mild asthma exacerbation caused by the flu. You responded well to a breathing treatments and your oxygen levels remained normal.   I have discharged you with a prescription for Prednisone. Please take 40 mg of prednisone daily (two tablets) with breakfast for a total of 4 more days. Continue to take your Symbicort as prescribed.   You can discontinue your antibiotics. I suspect all you symptoms are due to the flu.

## 2017-11-11 ENCOUNTER — Encounter: Payer: Self-pay | Admitting: Adult Health

## 2017-11-11 ENCOUNTER — Ambulatory Visit: Payer: Medicaid Other | Admitting: Adult Health

## 2017-11-11 DIAGNOSIS — G4733 Obstructive sleep apnea (adult) (pediatric): Secondary | ICD-10-CM | POA: Diagnosis not present

## 2017-11-11 DIAGNOSIS — J4531 Mild persistent asthma with (acute) exacerbation: Secondary | ICD-10-CM | POA: Diagnosis not present

## 2017-11-11 MED ORDER — PREDNISONE 10 MG PO TABS: ORAL_TABLET | ORAL | 0 refills | Status: DC

## 2017-11-11 MED ORDER — AZITHROMYCIN 250 MG PO TABS: ORAL_TABLET | ORAL | 0 refills | Status: AC

## 2017-11-11 NOTE — Assessment & Plan Note (Addendum)
Flare with recent Influenza  Patient's medications were reviewed today and patient education was given. Computerized medication calendar was adjusted/completed   Plan  Patient Instructions  Zpack take as directed.  Prednisone taper over next week.  Mucinex DM Twice daily  As needed  Cough/congestion  Follow med calendar closely and bring to each visit.  Follow up with Dr. Melvyn Novas  In 3 months and As needed   Please contact office for sooner follow up if symptoms do not improve or worsen or seek emergency care

## 2017-11-11 NOTE — Assessment & Plan Note (Signed)
Doing well on BIPAP   Plan  Cont on BIPAP .

## 2017-11-11 NOTE — Progress Notes (Signed)
Chart and office note reviewed in detail  > agree with a/p as outlined    

## 2017-11-11 NOTE — Patient Instructions (Signed)
Zpack take as directed.  Prednisone taper over next week.  Mucinex DM Twice daily  As needed  Cough/congestion  Follow med calendar closely and bring to each visit.  Follow up with Dr. Melvyn Novas  In 3 months and As needed   Please contact office for sooner follow up if symptoms do not improve or worsen or seek emergency care

## 2017-11-11 NOTE — Assessment & Plan Note (Signed)
Wt loss  

## 2017-11-11 NOTE — Progress Notes (Signed)
@Patient  ID: Lori Bean, female    DOB: 1971-06-02, 47 y.o.   MRN: [id number]  Chief Complaint  Patient presents with  . Follow-up    Referring provider: [name], [place]  HPI: 47 yo female former smoker seen for sleep evaluation and dyspnea 09/23/16 for evaluation of sleep apnea. Found to have moderate OSA and Asthma   PMH of HTN heart dz, Diastolic CHF , Bipolar and Schizophrenia   TEST  06/2016 sleep study AHI 26/hr  PFT's 11/10/2016 FEV1 2.06 (92 % ) ratio 81 p 24 % improvement from saba p ? prior to study with DLCO 93/94 % corrects to 102 % for alv volume  - 06/04/2017 After extensive coaching HFA effectiveness =   11/11/2017 Follow up ; Asthma and OSA  Patient presents for a 2-week follow-up and medication review. Patient says since last visit she was diagnosed with influenza A.  Started on Tamiflu.  She has finished this a few days ago.  She continues to have intermittent cough wheezing.  She is coughing up thick white mucus.  She denies any hemoptysis chest pain orthopnea PND or leg swelling.   We reviewed all  medications organize them into a medication calendar with pt education .  She appears to be taking her medications correctly.    Pt remains on BIPAP At bedtime  Download shows good compliance with AHI at 0.3  Minimal leaks . Feels rested other than coughing is aggravating her sleep .   Allergies  Allergen Reactions  . Aspirin Nausea And Vomiting    Immunization History  Administered Date(s) Administered  . Influenza Split 06/07/2013, 06/15/2015  . Influenza,inj,Quad PF,6+ Mos 07/22/2017  . Pneumococcal Polysaccharide-23 04/03/2014  . Rabies, IM 05/04/2016, 05/12/2016    Past Medical History:  Diagnosis Date  . Anginal pain ([place])    occ from asthma  . Anxiety   . Arthritis   . Asthma   . Bipolar affective disorder ([place])    Schizophrenia  . Bronchitis    hx of  . CHF (congestive heart failure) ([place])   . Depression   . GERD  (gastroesophageal reflux disease)   . Heart murmur   . Hypertension   . Neuropathy    left leg , "from back surgery"  . Overactive bladder   . Schizophrenia ([place])   . Sciatica   . Shortness of breath   . Snoring disorder    Pt stated my boyfriend always wakes me up and tells me to breathe    Tobacco History: Social History   Tobacco Use  Smoking Status Current Every Day Smoker  . Packs/day: 0.50  . Years: 24.00  . Pack years: 12.00  . Types: Cigarettes  Smokeless Tobacco Never Used   Ready to quit: Not Answered Counseling given: Not Answered   Outpatient Encounter Medications as of 11/11/2017  Medication Sig  . albuterol (PROAIR HFA) 108 (90 Base) MCG/ACT inhaler INHALE TWO PUFFS INTO THE LUNGS EVERY SIX HOURS AS NEEDED FOR WHEEZING OR SHORTNESS OF BREATH (Patient taking differently: Inhale 2 puffs into the lungs every 6 (six) hours as needed for wheezing or shortness of breath. )  . albuterol (PROVENTIL) (2.5 MG/3ML) 0.083% nebulizer solution USE 1 VIAL VIA NEBULIZER Q 6 H  . amLODipine (NORVASC) 10 MG tablet Take 1 tablet (10 mg total) by mouth daily.  . ARIPiprazole (ABILIFY) 20 MG tablet Take 1 tablet (20 mg total) by mouth at bedtime. (Patient taking differently: Take 20 mg by mouth 2 (  two) times daily. )  . budesonide-formoterol (SYMBICORT) 160-4.5 MCG/ACT inhaler Inhale 2 puffs into the lungs 2 (two) times daily.  [name] DIOVAN 320 MG tablet Take 1 tablet (320 mg total) by mouth daily.  . EUCRISA 2 % OINT APP EXT AA BID  . famotidine (PEPCID) 20 MG tablet One at bedtime  . furosemide (LASIX) 80 MG tablet Take 80 mg by mouth 2 (two) times daily.  . hydrOXYzine (ATARAX/VISTARIL) 25 MG tablet TK 1 T PO  TID  . pantoprazole (PROTONIX) 40 MG tablet Take 1 tablet (40 mg total) by mouth daily. Take 30-60 min before first meal of the day  . Potassium Chloride ER 20 MEQ TBCR Take 20 mEq by mouth 2 (two) times daily.  . pregabalin (LYRICA) 150 MG capsule Take 150 mg by mouth daily.    [name] tiZANidine (ZANAFLEX) 4 MG tablet Take 1 tablet by mouth every 8 (eight) hours as needed.  . triamcinolone cream (KENALOG) 0.1 % Apply 1 application topically 2 (two) times daily as needed (foot rash).  [name] azithromycin (ZITHROMAX Z-PAK) 250 MG tablet Take 2 tablets (500 mg) on  Day 1,  followed by 1 tablet (250 mg) once daily on Days 2 through 5.  . carvedilol (COREG) 25 MG tablet Take 1 tablet (25 mg total) 2 (two) times daily by mouth.  . predniSONE (DELTASONE) 10 MG tablet 4 tabs for 2 days, then 3 tabs for 2 days, 2 tabs for 2 days, then 1 tab for 2 days, then stop  . [DISCONTINUED] solifenacin (VESICARE) 5 MG tablet Take 5 mg by mouth daily.   No facility-administered encounter medications on file as of 11/11/2017.      Review of Systems  Constitutional:   No  weight loss, night sweats,  Fevers, chills,  +fatigue, or  lassitude.  HEENT:   No headaches,  Difficulty swallowing,  Tooth/dental problems, or  Sore throat,                No sneezing, itching, ear ache, nasal congestion, post nasal drip,   CV:  No chest pain,  Orthopnea, PND, swelling in lower extremities, anasarca, dizziness, palpitations, syncope.   GI  No heartburn, indigestion, abdominal pain, nausea, vomiting, diarrhea, change in bowel habits, loss of appetite, bloody stools.   Resp: .  No chest wall deformity  Skin: no rash or lesions.  GU: no dysuria, change in color of urine, no urgency or frequency.  No flank pain, no hematuria   MS:  No joint pain or swelling.  No decreased range of motion.  No back pain.    Physical Exam  BP 126/74 (BP Location: Left Arm, Cuff Size: Large)   Pulse 86   Ht 5\' 2"  (1.575 m)   Wt 255 lb (115.7 kg)   SpO2 100%   BMI 46.64 kg/m   GEN: A/Ox3; pleasant , NAD, obese    HEENT:  [place]/AT,  EACs-clear, TMs-wnl, NOSE-clear, THROAT-clear, no lesions, no postnasal drip or exudate noted. Class 2-3 MP airway   NECK:  Supple w/ fair ROM; no JVD; normal carotid impulses w/o bruits;  no thyromegaly or nodules palpated; no lymphadenopathy.    RESP  Few trace wheezing , speaks in full sentences ,  accessory muscle use, no dullness to percussion  CARD:  RRR, no m/r/g, no peripheral edema, pulses intact, no cyanosis or clubbing.  GI:   Soft & nt; nml bowel sounds; no organomegaly or masses detected.    Musco: Warm bil, no deformities  or joint swelling noted.   Neuro: alert, no focal deficits noted.    Skin: Warm, no lesions or rashes    Lab Results:   BNP   Imaging: Dg Chest 2 View  Result Date: 11/04/2017 CLINICAL DATA:  Shortness of Breath EXAM: CHEST  2 VIEW COMPARISON:  October 28, 2017 FINDINGS: There is no edema or consolidation. Heart is upper normal in size with pulmonary vascularity within normal limits. No adenopathy. There is degenerative change in thoracic spine. IMPRESSION: No edema or consolidation.  Stable cardiac silhouette. Electronically Signed   By: Lowella Grip III M.D.   On: 11/04/2017 14:11   Dg Chest 2 View  Result Date: 10/28/2017 CLINICAL DATA:  Cough, chest congestion, shortness of breath, and mid sternal chest pain for the past week. History of CHF, asthma, current smoker. EXAM: CHEST  2 VIEW COMPARISON:  PA and lateral chest x-ray of June 04, 2017 FINDINGS: The lungs are well-expanded. The interstitial markings are coarse. There is new linear increased density in the posterior aspect of the right lower lobe. There is no alveolar infiltrate. The heart and pulmonary vascularity are normal. There is multilevel degenerative disc disease of the thoracic spine. IMPRESSION: Chronic bronchitic-smoking related changes. New subsegmental atelectasis or early pneumonia in the right lower lobe. Followup PA and lateral chest X-ray is recommended in 3-4 weeks following trial of antibiotic therapy to ensure resolution and exclude underlying malignancy. No CHF. Electronically Signed   By: David  Martinique M.D.   On: 10/28/2017 11:38     Assessment  & Plan:   Asthma exacerbation Flare with recent Influenza  Patient's medications were reviewed today and patient education was given. Computerized medication calendar was adjusted/completed   Plan  Patient Instructions  Zpack take as directed.  Prednisone taper over next week.  Mucinex DM Twice daily  As needed  Cough/congestion  Follow med calendar closely and bring to each visit.  Follow up with Dr. Melvyn Novas  In 3 months and As needed   Please contact office for sooner follow up if symptoms do not improve or worsen or seek emergency care      Morbid obesity due to excess calories ([place]) Wt loss   OSA (obstructive sleep apnea) Doing well on BIPAP   Plan  Cont on BIPAP .      Rexene Edison, NP 11/11/2017

## 2017-11-16 ENCOUNTER — Ambulatory Visit: Payer: Medicaid Other | Admitting: Pulmonary Disease

## 2017-11-17 ENCOUNTER — Inpatient Hospital Stay (HOSPITAL_COMMUNITY)
Admission: AD | Admit: 2017-11-17 | Discharge: 2017-11-17 | Disposition: A | Discharge status: Home / Self Care | Payer: Medicaid Other | Source: Ambulatory Visit | Attending: Obstetrics & Gynecology | Admitting: Obstetrics & Gynecology

## 2017-11-17 ENCOUNTER — Other Ambulatory Visit: Payer: Self-pay

## 2017-11-17 ENCOUNTER — Encounter (HOSPITAL_COMMUNITY): Payer: Self-pay

## 2017-11-17 DIAGNOSIS — F419 Anxiety disorder, unspecified: Secondary | ICD-10-CM | POA: Insufficient documentation

## 2017-11-17 DIAGNOSIS — Z79899 Other long term (current) drug therapy: Secondary | ICD-10-CM | POA: Insufficient documentation

## 2017-11-17 DIAGNOSIS — F319 Bipolar disorder, unspecified: Secondary | ICD-10-CM | POA: Diagnosis not present

## 2017-11-17 DIAGNOSIS — I110 Hypertensive heart disease with heart failure: Secondary | ICD-10-CM | POA: Diagnosis not present

## 2017-11-17 DIAGNOSIS — Z886 Allergy status to analgesic agent status: Secondary | ICD-10-CM | POA: Insufficient documentation

## 2017-11-17 DIAGNOSIS — R109 Unspecified abdominal pain: Secondary | ICD-10-CM

## 2017-11-17 DIAGNOSIS — N3281 Overactive bladder: Secondary | ICD-10-CM | POA: Diagnosis not present

## 2017-11-17 DIAGNOSIS — K219 Gastro-esophageal reflux disease without esophagitis: Secondary | ICD-10-CM | POA: Diagnosis not present

## 2017-11-17 DIAGNOSIS — R1030 Lower abdominal pain, unspecified: Secondary | ICD-10-CM | POA: Diagnosis present

## 2017-11-17 DIAGNOSIS — N939 Abnormal uterine and vaginal bleeding, unspecified: Secondary | ICD-10-CM | POA: Insufficient documentation

## 2017-11-17 DIAGNOSIS — J45909 Unspecified asthma, uncomplicated: Secondary | ICD-10-CM | POA: Insufficient documentation

## 2017-11-17 DIAGNOSIS — F209 Schizophrenia, unspecified: Secondary | ICD-10-CM | POA: Insufficient documentation

## 2017-11-17 DIAGNOSIS — F17210 Nicotine dependence, cigarettes, uncomplicated: Secondary | ICD-10-CM | POA: Diagnosis not present

## 2017-11-17 DIAGNOSIS — G629 Polyneuropathy, unspecified: Secondary | ICD-10-CM | POA: Insufficient documentation

## 2017-11-17 DIAGNOSIS — G4730 Sleep apnea, unspecified: Secondary | ICD-10-CM | POA: Diagnosis not present

## 2017-11-17 DIAGNOSIS — I509 Heart failure, unspecified: Secondary | ICD-10-CM | POA: Diagnosis not present

## 2017-11-17 HISTORY — DX: Sleep apnea, unspecified: G47.30

## 2017-11-17 LAB — CBC
HCT: 38.4 % (ref 36.0–46.0)
Hemoglobin: 13.2 g/dL (ref 12.0–15.0)
MCH: 31.9 pg (ref 26.0–34.0)
MCHC: 34.4 g/dL (ref 30.0–36.0)
MCV: 92.8 fL (ref 78.0–100.0)
Platelets: 277 10*3/uL (ref 150–400)
RBC: 4.14 MIL/uL (ref 3.87–5.11)
RDW: 16.1 % — ABNORMAL HIGH (ref 11.5–15.5)
WBC: 13.1 10*3/uL — ABNORMAL HIGH (ref 4.0–10.5)

## 2017-11-17 MED ORDER — MEGESTROL ACETATE 40 MG PO TABS: 40.0000 mg | ORAL_TABLET | Freq: Two times a day (BID) | ORAL | 0 refills | Status: DC

## 2017-11-17 MED ORDER — KETOROLAC TROMETHAMINE 60 MG/2ML IM SOLN: 60.0000 mg | Freq: Once | INTRAMUSCULAR | Status: DC

## 2017-11-17 MED ORDER — TRAMADOL HCL 50 MG PO TABS
100.0000 mg | ORAL_TABLET | Freq: Once | ORAL | Status: AC
  Administered 2017-11-17: 100 mg via ORAL
  Filled 2017-11-17: qty 2

## 2017-11-17 MED ORDER — MEGESTROL ACETATE 40 MG PO TABS
40.0000 mg | ORAL_TABLET | Freq: Once | ORAL | Status: AC
  Administered 2017-11-17: 40 mg via ORAL
  Filled 2017-11-17: qty 1

## 2017-11-17 NOTE — MAU Note (Signed)
Hadn't had a period in 6 months.  Started bleeding on 2/24, has used a pack of 33 pads since 3/1.  Bleeding is bright red, and passing a lot of clots.  Having a lot of cramping and pressure. Thinks she needs a d&c.

## 2017-11-17 NOTE — MAU Provider Note (Signed)
History     CSN: [id number]  Arrival date and time: 11/17/17 1733   First Provider Initiated Contact with Patient 11/17/17 1910      Chief Complaint  Patient presents with  . Vaginal Bleeding  . Abdominal Pain   HPI   [name] is a 47 y.o. female [phone] non pregnant female here in MAU with abnormal vaginal bleeding. Says she started bleeding on 2/24 and has not stopped since. Says she has been wearing 2 pads at a time, and changing more than 6 pads per day. Says this is the first time she has had a period in 6 months. No GYN care, has not had a recent pap or GYN workup. Has cramping in her lower abdomen. The cramping is constant. Says she has not taken anything for the pain.  Was recently treated for the flu. Feels like she is getting better.   OB History    Gravida Para Term Preterm AB Living   4 2 0 2 2 2    SAB TAB Ectopic Multiple Live Births   1 1     2       Past Medical History:  Diagnosis Date  . Anginal pain ([place])    occ from asthma  . Anxiety   . Arthritis   . Asthma   . Bipolar affective disorder ([place])    Schizophrenia  . Bronchitis    hx of  . CHF (congestive heart failure) ([place])   . Depression   . GERD (gastroesophageal reflux disease)   . Heart murmur   . Hypertension    takes meds  . Neuropathy    left leg , "from back surgery"  . Overactive bladder   . Schizophrenia ([place])   . Sciatica   . Shortness of breath   . Sleep apnea   . Snoring disorder    Pt stated my boyfriend always wakes me up and tells me to breathe    Past Surgical History:  Procedure Laterality Date  . BACK SURGERY     3 back surgeries  . CHOLECYSTECTOMY    . COLONOSCOPY WITH PROPOFOL N/A 01/23/2016   Procedure: COLONOSCOPY WITH PROPOFOL;  Surgeon: Wonda Horner, MD;  Location: [facility];  Service: Endoscopy;  Laterality: N/A;  . EYE SURGERY     Metal plate in right eye. Had fracture in right eye  . gallstones reomved    . KNEE ARTHROSCOPY WITH MENISCAL  REPAIR Right 09/28/2016   Procedure: KNEE ARTHROSCOPY WITH MENISCAL REPAIR;  Surgeon: Dorna Leitz, MD;  Location: [facility];  Service: Orthopedics;  Laterality: Right;  Right partial meniscectomy and chondroplasty, patellar/femoral joint and medial femoral condyle   . LUMBAR LAMINECTOMY/DECOMPRESSION MICRODISCECTOMY  04/01/2012   Procedure: LUMBAR LAMINECTOMY/DECOMPRESSION MICRODISCECTOMY 2 LEVELS;  Surgeon: Faythe Ghee, MD;  [facility];  Service: Neurosurgery;  Laterality: Left;  Lumbar four-five, lumbar five sacral one microdiscectomy   . LUMBAR WOUND DEBRIDEMENT  04/29/2012   Procedure: LUMBAR WOUND DEBRIDEMENT;  Surgeon: Faythe Ghee, MD;  Location: [facility];  Service: Neurosurgery;  Laterality: N/A;  lumbar wound debridement  . ROTATOR CUFF REPAIR     Right shoulder  . TUBAL LIGATION      Family History  Problem Relation Age of Onset  . Diabetes Mother   . Hypertension Mother   . Diabetes Father   . Heart disease Paternal Aunt   . Cancer Paternal Aunt     Social History   Tobacco Use  .  Smoking status: Current Every Day Smoker    Packs/day: 0.50    Years: 24.00    Pack years: 12.00    Types: Cigarettes  . Smokeless tobacco: Never Used  Substance Use Topics  . Alcohol use: No    Alcohol/week: 0.0 oz    Comment: quit Nov. 2014  . Drug use: No    Allergies:  Allergies  Allergen Reactions  . Aspirin Nausea And Vomiting    Medications Prior to Admission  Medication Sig Dispense Refill Last Dose  . albuterol (PROAIR HFA) 108 (90 Base) MCG/ACT inhaler INHALE TWO PUFFS INTO THE LUNGS EVERY SIX HOURS AS NEEDED FOR WHEEZING OR SHORTNESS OF BREATH (Patient taking differently: Inhale 2 puffs into the lungs every 6 (six) hours as needed for wheezing or shortness of breath. ) 8.5 g 3 Taking  . albuterol (PROVENTIL) (2.5 MG/3ML) 0.083% nebulizer solution USE 1 VIAL VIA NEBULIZER Q 6 H  2 Taking  . amLODipine (NORVASC) 10 MG tablet Take 1 tablet (10 mg total) by  mouth daily. 90 tablet 0 Taking  . ARIPiprazole (ABILIFY) 20 MG tablet Take 1 tablet (20 mg total) by mouth at bedtime. (Patient taking differently: Take 20 mg by mouth 2 (two) times daily. ) 30 tablet 0 Taking  . budesonide-formoterol (SYMBICORT) 160-4.5 MCG/ACT inhaler Inhale 2 puffs into the lungs 2 (two) times daily. 1 Inhaler 0 Taking  . carvedilol (COREG) 25 MG tablet Take 1 tablet (25 mg total) 2 (two) times daily by mouth. 180 tablet 3 11/04/2017 at 0800  . DIOVAN 320 MG tablet Take 1 tablet (320 mg total) by mouth daily. 30 tablet 5 Taking  . EUCRISA 2 % OINT APP EXT AA BID  0 Taking  . famotidine (PEPCID) 20 MG tablet One at bedtime 30 tablet 11 Taking  . furosemide (LASIX) 80 MG tablet Take 80 mg by mouth 2 (two) times daily.   Taking  . hydrOXYzine (ATARAX/VISTARIL) 25 MG tablet TK 1 T PO  TID  0 Taking  . pantoprazole (PROTONIX) 40 MG tablet Take 1 tablet (40 mg total) by mouth daily. Take 30-60 min before first meal of the day 30 tablet 11 Taking  . Potassium Chloride ER 20 MEQ TBCR Take 20 mEq by mouth 2 (two) times daily. 60 tablet 0 Taking  . predniSONE (DELTASONE) 10 MG tablet 4 tabs for 2 days, then 3 tabs for 2 days, 2 tabs for 2 days, then 1 tab for 2 days, then stop 20 tablet 0   . pregabalin (LYRICA) 150 MG capsule Take 150 mg by mouth daily.   Taking  . tiZANidine (ZANAFLEX) 4 MG tablet Take 1 tablet by mouth every 8 (eight) hours as needed.  1 Taking  . triamcinolone cream (KENALOG) 0.1 % Apply 1 application topically 2 (two) times daily as needed (foot rash). 30 g 2 Taking   Results for orders placed or performed during the hospital encounter of 11/17/17 (from the past 48 hour(s))  CBC     Status: Abnormal   Collection Time: 11/17/17  6:11 PM  Result Value Ref Range   WBC 13.1 (H) 4.0 - 10.5 K/uL   RBC 4.14 3.87 - 5.11 MIL/uL   Hemoglobin 13.2 12.0 - 15.0 g/dL   HCT 38.4 36.0 - 46.0 %   MCV 92.8 78.0 - 100.0 fL   MCH 31.9 26.0 - 34.0 pg   MCHC 34.4 30.0 - 36.0 g/dL    RDW 16.1 (H) 11.5 - 15.5 %   Platelets 277  150 - 400 K/uL    Comment: Performed at [facility], [address] [place], [place] [address]   Review of Systems  Constitutional: Negative for fever.  Gastrointestinal: Positive for abdominal pain.  Neurological: Positive for dizziness and light-headedness.   Physical Exam   Blood pressure (!) 153/87, pulse 80, temperature 99.1 F (37.3 C), temperature source Oral, resp. rate (!) 24, weight 262 lb 12 oz (119.2 kg), last menstrual period 11/07/2017, SpO2 97 %.  Physical Exam  Constitutional: She is oriented to person, place, and time. She appears well-developed and well-nourished. No distress.  HENT:  Head: Normocephalic.  GI: Soft. She exhibits no distension. There is no tenderness. There is no rebound and no guarding.  Genitourinary:  Genitourinary Comments: Vagina - Small amount of dark red blood pooling in the vagina, no odor  Cervix - small amount of dark blood from the os.  Bimanual exam: Cervix closed Exam difficult due to body habitus.  Chaperone present for exam.   Musculoskeletal: Normal range of motion.  Neurological: She is alert and oriented to person, place, and time.  Skin: Skin is warm. She is not diaphoretic.  Psychiatric: Her behavior is normal.    MAU Course  Procedures  None   MDM 97% on RA CBC stable  Ultram given 1 tab Megace   Assessment and Plan   1. Abnormal vaginal bleeding   2. Abdominal cramping    P: Discharge home in stable condition Msg to [place] for f/u appt & outpatient gyn ultrasound Bleeding precautions discussed Rx megace & Dion Body Rasch 11/17/2017, 7:12 PM

## 2017-11-17 NOTE — Discharge Instructions (Signed)

## 2017-11-18 ENCOUNTER — Telehealth: Payer: Self-pay | Admitting: General Practice

## 2017-11-18 NOTE — Telephone Encounter (Signed)
Called and notified patient of appointment on 12/07/17.  Patient voiced understanding.

## 2017-11-24 ENCOUNTER — Ambulatory Visit (HOSPITAL_COMMUNITY): Payer: Medicaid Other

## 2017-11-24 NOTE — Addendum Note (Signed)
Addended by: Desmond Dike C on: 11/24/2017 03:48 PM   Modules accepted: Orders

## 2017-11-25 ENCOUNTER — Ambulatory Visit (HOSPITAL_COMMUNITY)
Admission: RE | Admit: 2017-11-25 | Discharge: 2017-11-25 | Disposition: A | Discharge status: Home / Self Care | Payer: Medicaid Other | Source: Ambulatory Visit | Attending: Obstetrics and Gynecology | Admitting: Obstetrics and Gynecology

## 2017-11-25 DIAGNOSIS — R109 Unspecified abdominal pain: Secondary | ICD-10-CM | POA: Diagnosis not present

## 2017-11-25 DIAGNOSIS — N939 Abnormal uterine and vaginal bleeding, unspecified: Secondary | ICD-10-CM

## 2017-12-07 ENCOUNTER — Encounter: Payer: Self-pay | Admitting: Family Medicine

## 2017-12-07 ENCOUNTER — Ambulatory Visit (INDEPENDENT_AMBULATORY_CARE_PROVIDER_SITE_OTHER): Payer: Medicaid Other | Admitting: Family Medicine

## 2017-12-07 ENCOUNTER — Other Ambulatory Visit (HOSPITAL_COMMUNITY)
Admission: RE | Admit: 2017-12-07 | Discharge: 2017-12-07 | Disposition: A | Discharge status: Home / Self Care | Payer: Medicaid Other | Source: Ambulatory Visit | Attending: Family Medicine | Admitting: Family Medicine

## 2017-12-07 VITALS — BP 118/69 | HR 72 | Ht 62.0 in | Wt 256.0 lb

## 2017-12-07 DIAGNOSIS — Z124 Encounter for screening for malignant neoplasm of cervix: Secondary | ICD-10-CM | POA: Diagnosis present

## 2017-12-07 DIAGNOSIS — N939 Abnormal uterine and vaginal bleeding, unspecified: Secondary | ICD-10-CM | POA: Insufficient documentation

## 2017-12-07 DIAGNOSIS — G8918 Other acute postprocedural pain: Secondary | ICD-10-CM

## 2017-12-07 DIAGNOSIS — F17200 Nicotine dependence, unspecified, uncomplicated: Secondary | ICD-10-CM

## 2017-12-07 LAB — POCT PREGNANCY, URINE: Preg Test, Ur: NEGATIVE

## 2017-12-07 MED ORDER — IBUPROFEN 200 MG PO TABS
600.0000 mg | ORAL_TABLET | Freq: Once | ORAL | Status: AC
  Administered 2017-12-07: 600 mg via ORAL

## 2017-12-09 LAB — CYTOLOGY - PAP
Chlamydia: NEGATIVE
Diagnosis: NEGATIVE
HPV: DETECTED — AB
Neisseria Gonorrhea: NEGATIVE
Trichomonas: NEGATIVE

## 2017-12-12 DIAGNOSIS — N939 Abnormal uterine and vaginal bleeding, unspecified: Secondary | ICD-10-CM | POA: Insufficient documentation

## 2017-12-12 NOTE — Progress Notes (Signed)
GYNECOLOGY OFFICE VISIT NOTE  History:  47 y.o. [id number] here today for abnormal vaginal bleeding. Reports prior history of regular menses, lasting about 5 days each, but menses have become irregular over th last year.  About 3 weeks ago, she was seen in MAU for heavy vaginal bleeding. She had gone 6 months w/o bleeding then had menses starting 2/24, and lasted over 2 weeks, and became very heavy. She was given megace, which she took and bleeding stopped. Since then bleeding has started again, but light. She denies any abnormal vaginal discharge, pelvic pain or other concerns. Unsure when her last pap smear was.  Past Medical History:  Diagnosis Date  . Anginal pain ([place])    occ from asthma  . Anxiety   . Arthritis   . Asthma   . Bipolar affective disorder ([place])    Schizophrenia  . Bronchitis    hx of  . CHF (congestive heart failure) ([place])   . Depression   . GERD (gastroesophageal reflux disease)   . Heart murmur   . Hypertension    takes meds  . Neuropathy    left leg , "from back surgery"  . Overactive bladder   . Schizophrenia ([place])   . Sciatica   . Shortness of breath   . Sleep apnea   . Snoring disorder    Pt stated my boyfriend always wakes me up and tells me to breathe    Past Surgical History:  Procedure Laterality Date  . BACK SURGERY     3 back surgeries  . CHOLECYSTECTOMY    . COLONOSCOPY WITH PROPOFOL N/A 01/23/2016   Procedure: COLONOSCOPY WITH PROPOFOL;  Surgeon: Wonda Horner, MD;  [facility];  Service: Endoscopy;  Laterality: N/A;  . EYE SURGERY     Metal plate in right eye. Had fracture in right eye  . gallstones reomved    . KNEE ARTHROSCOPY WITH MENISCAL REPAIR Right 09/28/2016   Procedure: KNEE ARTHROSCOPY WITH MENISCAL REPAIR;  Surgeon: Dorna Leitz, MD;  Location: [facility];  Service: Orthopedics;  Laterality: Right;  Right partial meniscectomy and chondroplasty, patellar/femoral joint and medial femoral condyle   . LUMBAR  LAMINECTOMY/DECOMPRESSION MICRODISCECTOMY  04/01/2012   Procedure: LUMBAR LAMINECTOMY/DECOMPRESSION MICRODISCECTOMY 2 LEVELS;  Surgeon: Faythe Ghee, MD;  Location: [facility];  Service: Neurosurgery;  Laterality: Left;  Lumbar four-five, lumbar five sacral one microdiscectomy   . LUMBAR WOUND DEBRIDEMENT  04/29/2012   Procedure: LUMBAR WOUND DEBRIDEMENT;  Surgeon: Faythe Ghee, MD;  Location: [facility];  Service: Neurosurgery;  Laterality: N/A;  lumbar wound debridement  . ROTATOR CUFF REPAIR     Right shoulder  . TUBAL LIGATION      The following portions of the patient's history were reviewed and updated as appropriate: allergies, current medications, past family history, past medical history, past social history, past surgical history and problem list.    Review of Systems:  Pertinent items noted in HPI and remainder of comprehensive ROS otherwise negative.   Objective:  Physical Exam BP 118/69   Pulse 72   Ht 5\' 2"  (1.575 m)   Wt 256 lb (116.1 kg)   LMP 11/07/2017   BMI 46.82 kg/m  CONSTITUTIONAL: Well-developed, well-nourished female in no acute distress.  HENT:  Normocephalic, atraumatic. External right and left ear normal. Oropharynx is clear and moist EYES: Conjunctivae and EOM are normal. Pupils are equal, round, and reactive to light. No scleral icterus.  NECK: Normal range of motion, supple, no  masses SKIN: Skin is warm and dry. No rash noted. Not diaphoretic. No erythema. No pallor. NEUROLOGIC: Alert and oriented to person, place, and time. Normal reflexes, muscle tone coordination. No cranial nerve deficit noted. PSYCHIATRIC: Normal mood and affect. Normal behavior. Normal judgment and thought content. CARDIOVASCULAR: Normal heart rate noted RESPIRATORY: Effort and breath sounds normal, no problems with respiration noted ABDOMEN: Soft, no distention noted.   PELVIC: Normal appearing external genitalia; normal appearing vaginal mucosa and cervix.  No abnormal  discharge noted.  Normal uterine size, no other palpable masses, no uterine or adnexal tenderness. MUSCULOSKELETAL: Normal range of motion. No edema noted.  Labs and Imaging US Pelvic Complete With Transvaginal  Result Date: 11/25/2017 CLINICAL DATA:  Abnormal uterine bleeding. Pelvic pain and cramping. EXAM: TRANSABDOMINAL AND TRANSVAGINAL ULTRASOUND OF PELVIS TECHNIQUE: Both transabdominal and transvaginal ultrasound examinations of the pelvis were performed. Transabdominal technique was performed for global imaging of the pelvis including uterus, ovaries, adnexal regions, and pelvic cul-de-sac. It was necessary to proceed with endovaginal exam following the transabdominal exam to visualize the endometrial stripe and ovaries. COMPARISON:  None FINDINGS: Uterus Measurements: 10.1 x 4.6 x 6.5 cm. No fibroids or other mass visualized. Endometrium Thickness: 16 mm.  No focal abnormality visualized. Right ovary Measurements: 2.6 x 1.6 x 1.4 cm. Normal appearance/no adnexal mass. Left ovary Measurements: 2.8 x 2.3 x 2.4 cm. Normal appearance/no adnexal mass. Other findings No abnormal free fluid. IMPRESSION: No evidence of uterine fibroids. Endometrial thickness measures 16 mm. If bleeding remains unresponsive to hormonal or medical therapy, focal lesion work-up with sonohysterogram should be considered. Endometrial biopsy should also be considered in pre-menopausal patients at high risk for endometrial carcinoma. (Ref: Radiological Reasoning: Algorithmic Workup of Abnormal Vaginal Bleeding with Endovaginal Sonography and Sonohysterography. AJR 2008; 185:U31-49). Normal appearance of both ovaries.  No adnexal mass identified. Electronically Signed   By: Earle Gell M.D.   On: 11/25/2017 15:45    Procedure: ENDOMETRIAL BIOPSY     The indications for endometrial biopsy were reviewed.   Risks of the biopsy including cramping, bleeding, infection, uterine perforation, inadequate specimen and need for additional  procedures  were discussed. The patient states she understands and agrees to undergo procedure today. Consent was signed. Time out was performed. Urine HCG was negative. During the pelvic exam, the cervix was prepped with Betadine. A single-toothed tenaculum was placed on the anterior lip of the cervix to stabilize it. The 3 mm pipelle was introduced into the endometrial cavity without difficulty to a depth of 9.5 cm, and a moderate amount of tissue was obtained and sent to pathology. The instruments were removed from the patient's vagina. Minimal bleeding from the cervix was noted. The patient tolerated the procedure well. Routine post-procedure instructions were given to the patient.     Assessment & Plan:  1. Abnormal uterine bleeding (AUB) - pt likely perimenopausal  --Reviewed with patient potential etiologies, and also reviewed recent ultrasound with her (no fibroids; endometrial stripe 16 mm).  --Recommended EMB given risk factors. Done today (see procedure note) --Pap smear with HPV cotesting collected today - Surgical pathology --Expectant management for now as bleeding has improved.   2. Morbid obesity ([place]) - Encouraged healthy lifestyle and weight loss  3. Smoker - Encouraged smoking sessation   5. Pap smear for cervical cancer screening - Cytology - PAP  6. Pain following surgery or procedure - ibuprofen (ADVIL,MOTRIN) tablet 600 mg given once s/p EMX - Ecouraged just using Tylenol at home, as long-term  NSAID not recommended d/t her hx of CHF   Routine preventative health maintenance measures emphasized. Please refer to After Visit Summary for other counseling recommendations.   No follow-ups on file.   Total face-to-face time with patient: >30 minutes. Over 50% of encounter was spent on counseling and coordination of care.  Almyra Free P. Degele, MD [facility] [employer]

## 2017-12-13 ENCOUNTER — Encounter: Payer: Self-pay | Admitting: *Deleted

## 2017-12-14 ENCOUNTER — Encounter: Payer: Self-pay | Admitting: Cardiovascular Disease

## 2017-12-14 ENCOUNTER — Ambulatory Visit: Payer: Medicaid Other | Admitting: Cardiovascular Disease

## 2017-12-14 VITALS — BP 138/72 | HR 92 | Wt 260.6 lb

## 2017-12-14 DIAGNOSIS — R0602 Shortness of breath: Secondary | ICD-10-CM | POA: Diagnosis not present

## 2017-12-14 DIAGNOSIS — I5032 Chronic diastolic (congestive) heart failure: Secondary | ICD-10-CM

## 2017-12-14 DIAGNOSIS — I10 Essential (primary) hypertension: Secondary | ICD-10-CM

## 2017-12-14 DIAGNOSIS — I110 Hypertensive heart disease with heart failure: Secondary | ICD-10-CM | POA: Diagnosis not present

## 2017-12-14 DIAGNOSIS — Z720 Tobacco use: Secondary | ICD-10-CM | POA: Diagnosis not present

## 2017-12-14 MED ORDER — OMEPRAZOLE 40 MG PO CPDR: 40.0000 mg | DELAYED_RELEASE_CAPSULE | Freq: Every day | ORAL | 0 refills | Status: DC

## 2017-12-14 MED ORDER — LISINOPRIL 40 MG PO TABS: 40.0000 mg | ORAL_TABLET | Freq: Every day | ORAL | 1 refills | Status: DC

## 2017-12-14 NOTE — Patient Instructions (Signed)
Medication Instructions:  STOP DIOVAN   START LISINOPRIL 40 MG DAILY   INCREASE OMEPRAZOLE TO 40 MG DAILY   Labwork: NONE   Testing/Procedures: NONE  Follow-Up: Your physician recommends that you schedule a follow-up appointment in: 2 MONTH OV  Any Other Special Instructions Will Be Listed Below (If Applicable).  CALL [facility] [phone]) TO GET AN APPOINTMENT    If you need a refill on your cardiac medications before your next appointment, please call your pharmacy.
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
## 2017-12-22 ENCOUNTER — Telehealth: Payer: Self-pay | Admitting: General Practice

## 2017-12-22 NOTE — Telephone Encounter (Signed)
Called patient & informed her of results & recommended follow up. Patient verbalized understanding & had no questions

## 2017-12-22 NOTE — Telephone Encounter (Signed)
-----   Message from Gailen Shelter, MD sent at 12/18/2017 10:38 PM EDT ----- Endometrial biopsy negative.  Pap smear normal cytology, but positive, HPV. Pt needs repeat co-testing in 1 year.

## 2018-01-13 ENCOUNTER — Other Ambulatory Visit: Payer: Self-pay | Admitting: Internal Medicine

## 2018-01-26 IMAGING — DX DG CHEST 2V
2 series · 2 of 2 positions shown · non-contrast
Comparison: Chest radiograph August 17, 2015 and chest CT August 17, 2015

CLINICAL DATA: Chest pain with shortness of breath for 1 day

EXAM:
CHEST  2 VIEW

[w chest pa]
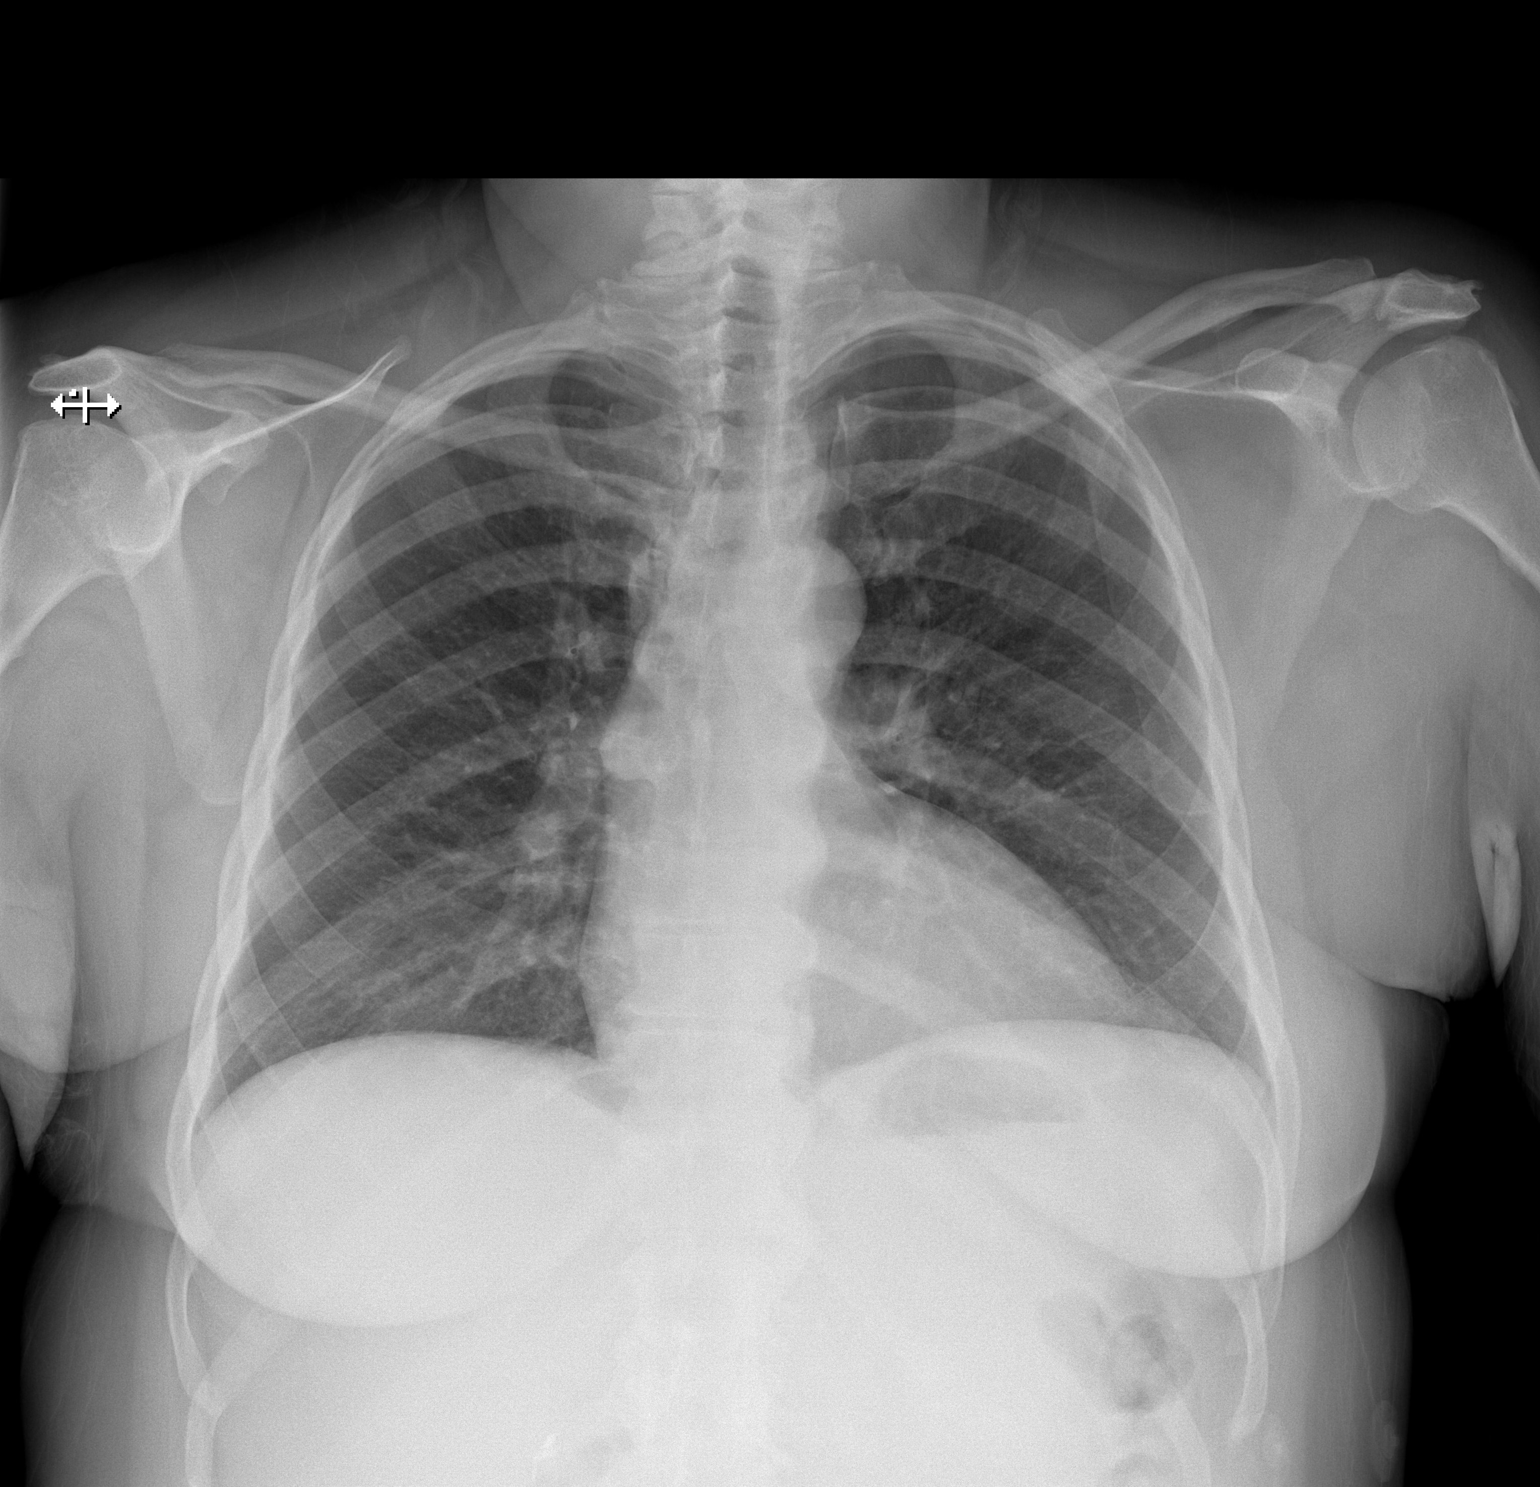

[w chest lat]
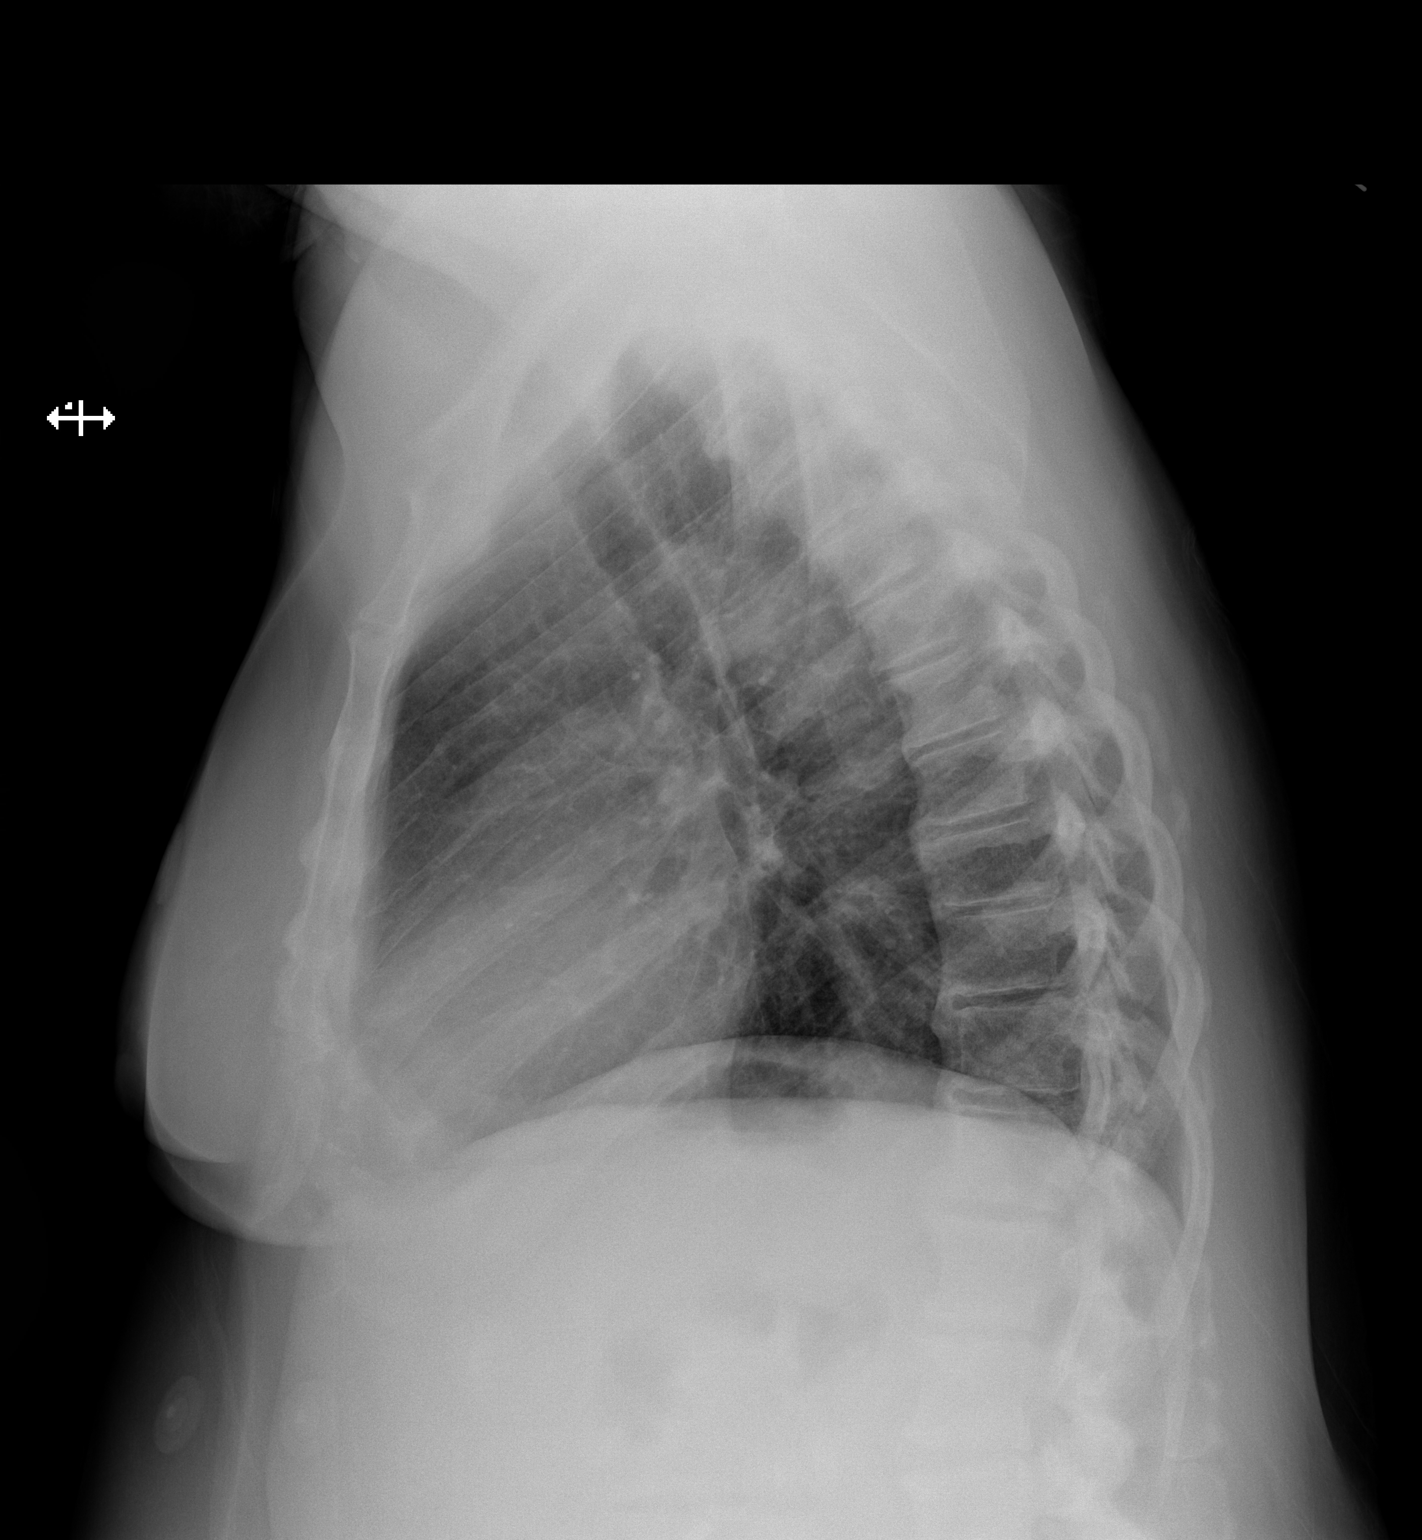

[2 of 2 positions shown; findings below may reference images not displayed]

FINDINGS: There is no edema or consolidation. The heart size and pulmonary
vascularity are normal. No adenopathy. There is degenerative change
in the thoracic spine.
IMPRESSION: No edema or consolidation.

## 2018-02-08 ENCOUNTER — Ambulatory Visit: Payer: Medicaid Other | Admitting: Internal Medicine

## 2018-02-25 IMAGING — US IR US GUIDE VASC ACCESS RIGHT
1 series · 1 of 1 positions shown · non-contrast
Comparison: none

INDICATION: 44-year-old female with upcoming colonoscopy, referred for
preoperative PICC placement.

[Series 1: ir fluoro/shunt/fist · 1 of 1 slices shown]
[im 1/1]
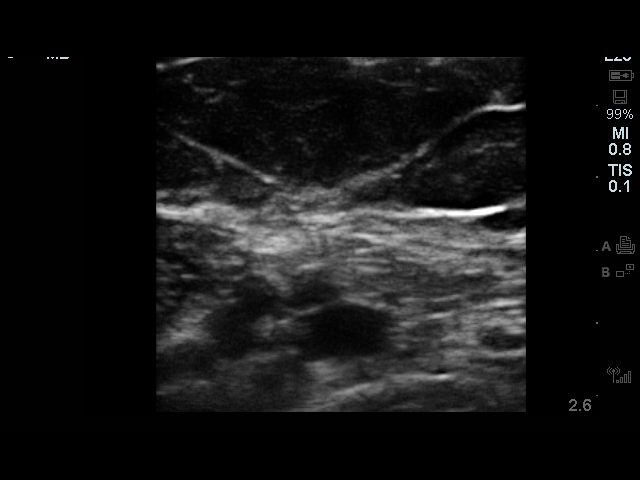

[1 of 1 positions shown; findings below may reference images not displayed]

EXAM:
RIGHT PICC LINE PLACEMENT WITH ULTRASOUND AND FLUOROSCOPIC GUIDANCE

MEDICATIONS:
None

ANESTHESIA/SEDATION:
No sedation

The patient's level of consciousness and vital signs were monitored
continuously by radiology nursing throughout the procedure under my
direct supervision.

FLUOROSCOPY TIME:  Fluoroscopy Time: 0 minutes 12 seconds

COMPLICATIONS:
None

PROCEDURE:
The patient was advised of the possible risks and complications and
agreed to undergo the procedure. The patient was then brought to the
angiographic suite for the procedure.

The right arm was prepped with chlorhexidine, draped in the usual
sterile fashion using maximum barrier technique (cap and mask,
sterile gown, sterile gloves, large sterile sheet, hand hygiene and
cutaneous antisepsis) and infiltrated locally with 1% Lidocaine.

Ultrasound demonstrated patency of the right brachial vein, and this
was documented with an image. Under real-time ultrasound guidance,
this vein was accessed with a 21 gauge micropuncture needle and
image documentation was performed. A [DATE] wire was introduced in to
the vein. Over this, a 5 French single lumen power injectable PICC
was advanced to the lower SVC/right atrial junction. Fluoroscopy
during the procedure and fluoro spot radiograph confirms appropriate
catheter position at the cavoatrial junction. The catheter was
flushed and covered with asterile dressing.

Catheter length: 40 cm
IMPRESSION: Status post right upper extremity brachial vein PICC measuring 40
cm. Catheter ready for use.

.

## 2018-03-01 ENCOUNTER — Ambulatory Visit: Payer: Medicaid Other | Admitting: Cardiovascular Disease

## 2018-03-01 NOTE — Progress Notes (Deleted)
Cardiology Office Note   Date:  03/01/2018   ID:  Lori Bean, DOB 1970-11-26, MRN [id number]  PCP:  [name], PA  Cardiologist:   [name], MD   No chief complaint on file.   Patient ID: Lori Bean is a 47 y.o. female with chronic diastolic heart failure, hypertensive heart disease, OSA, schizophrenia, bipolar disorder, obesity and prior PE who presents for follow up.  [name] was initially seen 05/2015 with fatigue and shortness of breath.  At the time she endorsed heart failure symptoms, including lower extremity edema, orthopnea and shortness of breath.  She also reported atypical chest pain.  After that appointment she was referred to the hospital and had a [place] 05/26/15 that was negative for ischemia and revealed LVEF 57%.  She had an echo 05/24/15 with LVEF 60-65% and grade 2 diastolic dysfunction.    At her last appointment [name] was switched from valsartan to lisinopril due to the national recall.     Past Medical History:  Diagnosis Date  . Anginal pain ([place])    occ from asthma  . Anxiety   . Arthritis   . Asthma   . Bipolar affective disorder ([place])    Schizophrenia  . Bronchitis    hx of  . CHF (congestive heart failure) ([place])   . Depression   . GERD (gastroesophageal reflux disease)   . Heart murmur   . Hypertension    takes meds  . Neuropathy    left leg , "from back surgery"  . Overactive bladder   . Schizophrenia ([place])   . Sciatica   . Shortness of breath   . Sleep apnea   . Snoring disorder    Pt stated my boyfriend always wakes me up and tells me to breathe    Past Surgical History:  Procedure Laterality Date  . BACK SURGERY     3 back surgeries  . CHOLECYSTECTOMY    . COLONOSCOPY WITH PROPOFOL N/A 01/23/2016   Procedure: COLONOSCOPY WITH PROPOFOL;  Surgeon: Wonda Horner, MD;  [facility]: [facility];  Service: Endoscopy;  Laterality: N/A;  . EYE SURGERY     Metal plate in right eye.  Had fracture in right eye  . gallstones reomved    . KNEE ARTHROSCOPY WITH MENISCAL REPAIR Right 09/28/2016   Procedure: KNEE ARTHROSCOPY WITH MENISCAL REPAIR;  Surgeon: Dorna Leitz, MD;  Location: [facility];  Service: Orthopedics;  Laterality: Right;  Right partial meniscectomy and chondroplasty, patellar/femoral joint and medial femoral condyle   . LUMBAR LAMINECTOMY/DECOMPRESSION MICRODISCECTOMY  04/01/2012   Procedure: LUMBAR LAMINECTOMY/DECOMPRESSION MICRODISCECTOMY 2 LEVELS;  Surgeon: Faythe Ghee, MD;  Location: [facility];  Service: Neurosurgery;  Laterality: Left;  Lumbar four-five, lumbar five sacral one microdiscectomy   . LUMBAR WOUND DEBRIDEMENT  04/29/2012   Procedure: LUMBAR WOUND DEBRIDEMENT;  Surgeon: Faythe Ghee, MD;  Location: [place] [facility];  Service: Neurosurgery;  Laterality: N/A;  lumbar wound debridement  . ROTATOR CUFF REPAIR     Right shoulder  . TUBAL LIGATION       Current Outpatient Medications  Medication Sig Dispense Refill  . albuterol (PROAIR HFA) 108 (90 Base) MCG/ACT inhaler Inhale 2 puffs into the lungs every 4 (four) hours as needed for wheezing or shortness of breath.    [name] albuterol (PROVENTIL) (2.5 MG/3ML) 0.083% nebulizer solution Take 2.5 mg by nebulization every 4 (four) hours as needed for wheezing or shortness of breath.    [name]  amitriptyline (ELAVIL) 25 MG tablet Take 25 mg by mouth at bedtime.    [name] amLODipine (NORVASC) 10 MG tablet Take 1 tablet (10 mg total) by mouth daily. 90 tablet 0  . ARIPiprazole (ABILIFY) 20 MG tablet Take 1 tablet (20 mg total) by mouth at bedtime. (Patient taking differently: Take 20 mg by mouth 2 (two) times daily. ) 30 tablet 0  . budesonide-formoterol (SYMBICORT) 160-4.5 MCG/ACT inhaler Inhale 2 puffs into the lungs 2 (two) times daily. 1 Inhaler 0  . carvedilol (COREG) 25 MG tablet Take 1 tablet (25 mg total) 2 (two) times daily by mouth. 180 tablet 3  . DULoxetine (CYMBALTA) 30 MG capsule Take 30 mg by mouth 2 (two)  times daily.    . famotidine (PEPCID) 20 MG tablet One at bedtime 30 tablet 11  . furosemide (LASIX) 80 MG tablet Take 80 mg by mouth 2 (two) times daily.    [name] lisinopril (PRINIVIL,ZESTRIL) 40 MG tablet Take 1 tablet (40 mg total) by mouth daily. 90 tablet 1  . omeprazole (PRILOSEC) 40 MG capsule Take 1 capsule (40 mg total) by mouth daily. 90 capsule 0  . potassium chloride SA (K-DUR,KLOR-CON) 20 MEQ tablet Take 20 mEq by mouth daily.     No current facility-administered medications for this visit.     Allergies:   Aspirin    Social History:  The patient  reports that she has been smoking cigarettes.  She has a 12.00 pack-year smoking history. She has never used smokeless tobacco. She reports that she does not drink alcohol or use drugs.   Family History:  The patient's family history includes Cancer in her paternal aunt; Diabetes in her father and mother; Heart disease in her paternal aunt; Hypertension in her mother.    ROS:  Please see the history of present illness.   Otherwise, review of systems are positive for none.   All other systems are reviewed and negative.    PHYSICAL EXAM: VS:  There were no vitals taken for this visit. , BMI There is no height or weight on file to calculate BMI. GENERAL:  Well appearing HEENT: Pupils equal round and reactive, fundi not visualized, oral mucosa unremarkable NECK:  No jugular venous distention, waveform within normal limits, carotid upstroke brisk and symmetric, no bruits, no thyromegaly LYMPHATICS:  No cervical adenopathy LUNGS:  Clear to auscultation bilaterally HEART:  RRR.  PMI not displaced or sustained,S1 and S2 within normal limits, no S3, no S4, no clicks, no rubs, no murmurs ABD:  Flat, positive bowel sounds normal in frequency in pitch, no bruits, no rebound, no guarding, no midline pulsatile mass, no hepatomegaly, no splenomegaly EXT:  2 plus pulses throughout, no edema, no cyanosis no clubbing SKIN:  No rashes no  nodules NEURO:  Cranial nerves II through XII grossly intact, motor grossly intact throughout PSYCH:  Cognitively intact, oriented to person place and time   EKG:  EKG is not ordered today. The ekg ordered 09/18/16 demonstrates sinus rhythm. Rate 77 bpm.  Non-specific T wave changes.    Echo 05/24/15: Study Conclusions  - Left ventricle: The cavity size was normal. Wall thickness was  increased in a pattern of mild LVH. Systolic function was normal.  The estimated ejection fraction was in the range of 60% to 65%.  Wall motion was normal; there were no regional wall motion  abnormalities. Features are consistent with a pseudonormal left  ventricular filling pattern, with concomitant abnormal relaxation  and increased filling pressure (grade  2 diastolic dysfunction).  Lexiscan Myoview 05/26/15: IMPRESSION: 1. No reversible ischemia or infarction.  2. Mild septal hypokinesis.  3. Left ventricular ejection fraction 57%  4. Low-risk stress test findings*.   Recent Labs: 05/06/2017: B Natriuretic Peptide 12.9 07/07/2017: ALT 28 10/28/2017: Pro B Natriuretic peptide (BNP) 14.0; TSH 0.72 11/04/2017: BUN 9; Creatinine, Ser 0.69; Potassium 3.7; Sodium 134 11/17/2017: Hemoglobin 13.2; Platelets 277    Lipid Panel    Component Value Date/Time   CHOL 133 05/25/2015 0333   TRIG 105 05/25/2015 0333   HDL 50 05/25/2015 0333   CHOLHDL 2.7 05/25/2015 0333   VLDL 21 05/25/2015 0333   LDLCALC 62 05/25/2015 0333      Wt Readings from Last 3 Encounters:  12/14/17 260 lb 9.6 oz (118.2 kg)  12/07/17 256 lb (116.1 kg)  11/17/17 262 lb 12 oz (119.2 kg)     Other studies Reviewed: Additional studies/ records that were reviewed today include: . Review of the above records demonstrates:  Please see elsewhere in the note.     ASSESSMENT AND PLAN:  # Hypertensive heart disease:  Blood pressure is slightly above goal.  She reports that it has been well-controlled at home.  Given the  national recall we will stop losartan and start lisinopril 40 mg daily.  Continue amlodipine, carvedilol, and Lasix.  Encouraged increased exercise of at least 150 minutes weekly.  # Dysphagia: She was encouraged to call her gastroenterologist.  Increase omeprazole to 40mg  and continue famotidine.  # Chronic diastolic heart failure:  # Obesity: # Shortness of breath: # Asthma/COPD: Stable.  Ms. Romm is euvolemic on exam today.  She reports lower extremity and upper extremity edema.  She was again reminded that this is not edema, but rather fatty tissue.  She was encouraged to start exercising most days of the week.  # Chest pain: Resolved.   She had a negative stress test 05/2015.  # Tobacco abuse: Encouraged smoking cessation with patches as this has been successful in the past.  Discussed smoking cessation for 4 minutes.   # Morbid Obesity: Exercise at least 150 minutes weekly.  Current medicines are reviewed at length with the patient today.  The patient does not have concerns regarding medicines.  The following changes have been made:  Switch valsartan to lisinopril.   Labs/ tests ordered today include:    No orders of the defined types were placed in this encounter.    Disposition:  FU with Dr. Jonelle Sidle C. [place] in 2 months.    Signed, [name], MD  03/01/2018 7:57 AM    [place]

## 2018-03-04 ENCOUNTER — Ambulatory Visit (INDEPENDENT_AMBULATORY_CARE_PROVIDER_SITE_OTHER): Payer: Medicaid Other | Admitting: Internal Medicine

## 2018-03-04 ENCOUNTER — Encounter: Payer: Self-pay | Admitting: Internal Medicine

## 2018-03-04 DIAGNOSIS — F17210 Nicotine dependence, cigarettes, uncomplicated: Secondary | ICD-10-CM | POA: Diagnosis not present

## 2018-03-04 DIAGNOSIS — I10 Essential (primary) hypertension: Secondary | ICD-10-CM | POA: Diagnosis not present

## 2018-03-04 DIAGNOSIS — J4530 Mild persistent asthma, uncomplicated: Secondary | ICD-10-CM | POA: Diagnosis not present

## 2018-03-04 DIAGNOSIS — R0609 Other forms of dyspnea: Secondary | ICD-10-CM | POA: Diagnosis not present

## 2018-03-04 DIAGNOSIS — R0600 Dyspnea, unspecified: Secondary | ICD-10-CM

## 2018-03-04 MED ORDER — IRBESARTAN 300 MG PO TABS: 300.0000 mg | ORAL_TABLET | Freq: Every day | ORAL | 11 refills | Status: DC

## 2018-03-04 MED ORDER — AMOXICILLIN-POT CLAVULANATE 875-125 MG PO TABS: 1.0000 | ORAL_TABLET | Freq: Two times a day (BID) | ORAL | 0 refills | Status: AC

## 2018-03-04 MED ORDER — PREDNISONE 10 MG PO TABS: ORAL_TABLET | ORAL | 0 refills | Status: DC

## 2018-03-04 MED ORDER — BUDESONIDE-FORMOTEROL FUMARATE 80-4.5 MCG/ACT IN AERO: 2.0000 | INHALATION_SPRAY | Freq: Two times a day (BID) | RESPIRATORY_TRACT | 11 refills | Status: DC

## 2018-03-04 MED ORDER — BUDESONIDE-FORMOTEROL FUMARATE 80-4.5 MCG/ACT IN AERO: 2.0000 | INHALATION_SPRAY | Freq: Two times a day (BID) | RESPIRATORY_TRACT | 1 refills | Status: DC

## 2018-03-04 NOTE — Assessment & Plan Note (Signed)
See pfts 11/10/16 c/w mild asthma  - 10/28/2017   Walked RA  2 laps @ 185 ft each stopped due to  Sob/ no desats at nl pace - 03/04/2018  Walked RA x 3 laps @ 185 ft each stopped due to  End of study, nl pace, no sob or desat     No further w/u for now

## 2018-03-04 NOTE — Patient Instructions (Addendum)
Plan A = Automatic = change symbicort to 80 Take 2 puffs first thing in am and then another 2 puffs about 12 hours later.   Work on inhaler technique:  relax and gently blow all the way out then take a nice smooth deep breath back in, triggering the inhaler at same time you start breathing in.  Hold for up to 5 seconds if you can. Blow out thru nose. Rinse and gargle with water when done     Plan B = Backup Only use your albuterol as a rescue medication to be used if you can't catch your breath by resting or doing a relaxed purse lip breathing pattern.  - The less you use it, the better it will work when you need it. - Ok to use the inhaler up to 2 puffs  every 4 hours if you must but call for appointment if use goes up over your usual need - Don't leave home without it !!  (think of it like the spare tire for your car)   Plan C = Crisis - only use your albuterol nebulizer if you first try Plan B and it fails to help > ok to use the nebulizer up to every 4 hours but if start needing it regularly call for immediate appointment    stop lisinopril and take avapro (ibesartan) 300 mg one daily in its place   Augmentin 875 mg take one pill twice daily  X 10 days - take at breakfast and supper with large glass of water.  It would help reduce the usual side effects (diarrhea and yeast infections) if you ate cultured yogurt at lunch.   Prednisone 10 mg take  4 each am x 2 days,   2 each am x 2 days,  1 each am x 2 days and stop    Please schedule a follow up office visit in 4 weeks, sooner if needed to see [name] NP but bring all meds with you

## 2018-03-04 NOTE — Progress Notes (Signed)
Subjective:     Patient ID: Lori Bean, female   DOB: [birth year]      MRN: [id number]  HPI  41 yobf MO quit smoking 04/2017 healthy child/ 2 IUP's  With baseline wt < 200 with new doe x 2012 assoc with wt gain gain x 2016 referred to pulmonary clinic 06/04/2017 by Dr   Emilee Hero bonsu   Admit date: 12/17/2016 Discharge date: 12/18/2016  Discharge Diagnoses:  Possible asthma exacerbation Essential hypertension Chronic diastolic heart failure Prediabetes Morbid obesity  History of present illness:  47 y.o.femalewith medical history significant of Asthma and OSA (reversibility demonstrated on PFTs in Feb this year), smoking, HTN, CHF grade 2 diastolic dysfunction. Patient presents to the ED with c/o SOB. Symptoms onset at rest yesterday, also has 10lb weight gain over past 3 weeks but states she is compliant with lasix 80 BID. 2 albuterol nebs before seeing PCP this AM. PCP sent her to ED for evaluation.  Hospital Course:  Possible asthma exacerbation -Patient states that her wheezing has actually improved and she is feeling better today -She recently finished a course of prednisone back in February 2018 -She has been following up with the pulmonary clinic -Chest x-ray -Spoke with Dr. [name], patient's pulmonologist. Of note, progress note from Dr. [name] back in January 2018 stated that he was not convinced of her asthma. At this time, Dr. [name] recommended no change in patient's medications. Follow-up as an outpatient. -Continue Advair Diskus, pro-air, QVAR -Patient was placed on prednisone 40 mg, will continue this for 4 days.  Essential hypertension -Continue amlodipine -Lasix was discontinued as she does complain of chronic cough  Chronic diastolic heart failure -Echocardiogram 05/24/2015 showed EF of 6237%, grade 2 diastolic dysfunction -Currently appears euvolemic and compensated on exam. -BNP 24.8 -Continue home Lasix dose with potassium  supplementation  Prediabetes -Hemoglobin A1c was 6.1 back in 2016. This can be followed and managed as an outpatient by patient's primary care physician.  Morbid obesity -BMI 47.1 -Patient only talk to her primary care physician regarding weight loss and lifestyle modifications. -Patient states she feels that she has gained 10-15 pounds in the past several weeks. States she has been watching what she eats. Admits that she has been using different scales at different doctors offices. Inquires about whether there is a program that offers free scales. Case management stated that this program is no longer available.       06/04/2017 1st   office visit/ Lori Bean  Re transition of care  Chief Complaint  Patient presents with  . Pulmonary Consult    Referred by Dr. Vista Lawman. Pt c/o DOE for approx 6 years, worse over the past wk to the point that she is winded just walking from room to room at home. She states also having some prod cough with large amounts of black colored sputum.  She has CP "stabbing pain" comes and goes and lasts for several minutes. She has a rash on her arm- not improving after seeing derm. She has also noticed blurred vision. She is using her proair inhaler 3-4 x per day.   first started on proair helped some, then symbicort sev months prior to OV>  Not much response, even neb now doesn't always help. 6 years prior to OV  Trouble flat and x 6 months sleeping upright due to sob immediately on lying back Doe progressive   But esp over the last week to room to room now Coughing more since 2 months > black mucus  assoc with cp with coughing  rec Plan A = Automatic = symbicort 80 Take 2 puffs first thing in am and then another 2 puffs about 12 hours later.  Pantoprazole (protonix) 40 mg (or prilosec 20 x 2)    Take  30-60 min before first meal of the day and Pepcid (famotidine)  20 mg one @  bedtime until return to office - this is the best way to tell whether stomach acid is  contributing to your problem.   Plan B = Backup Only use your albuterol as a rescue medication Plan C = Nebulizer  GERD diet   07/22/18 NP eval Lori Bean  Continue on Symbicort Twice daily  . Rinse after use.  Work on not smoking    10/28/2017  f/u ov/Lori Bean re: sob/ ? How much is asthma / still smoking  Chief Complaint  Patient presents with  . Follow-up    Breathing has been worse for the past wk- SOB walking from room to room. She is also c/o wheezing and some chest tightness. She is using her albuterol inhaler 4-5 x per day on average.   Dyspnea: room to room x one week Cough: more than usual x one week thick clear/ assoc st and dysphagia ? On ppi ac ?  Sleep: ok on cpap s 02   Using saba q am instead of prn  Midline pain with coughing only  rec Plan A = Automatic = change symbicort to 160 Take 2 puffs first thing in am and then another 2 puffs about 12 hours later.  Work on inhaler technique:     Plan B = Backup Only use your albuterol as a rescue medication Plan C = Crisis - only use your albuterol nebulizer if you first try Plan B       03/04/2018  f/u ov/Lori Bean re: AB on symbicort 160/ acei/ coreg 25 bid  Chief Complaint  Patient presents with  . Follow-up    still SOB, sore throat, difficulty swallowing, productive cough   Dyspnea:  Room to room Cough: is worse at hs / like choking /  [place] mucus/ nose stuffy now all x 2 months on acei gradually worse  Sleep: on cpap with some cough/gag  SABA use:  None  No obvious day to day or daytime variability or assoc  mucus plugs or hemoptysis or cp or chest tightness, subjective wheeze or overt sinus or hb symptoms. No unusual exposure hx or h/o childhood pna/ asthma or knowledge of premature birth.  Sleeping  Poorly even on cpap without nocturnal  or early am exacerbation  of respiratory  c/o's or need for noct saba. Also denies any obvious fluctuation of symptoms with weather or environmental changes or other aggravating or  alleviating factors except as outlined above   Current Allergies, Complete Past Medical History, Past Surgical History, Family History, and Social History were reviewed in [employer] record.  ROS  The following are not active complaints unless bolded Hoarseness, sore throat, dysphagia, dental problems, itching, sneezing,  nasal congestion or discharge of excess mucus or purulent secretions, ear ache,   fever, chills, sweats, unintended wt loss or wt gain, classically pleuritic or exertional cp,  orthopnea pnd or arm/hand swelling  or leg swelling, presyncope, palpitations, abdominal pain, anorexia, nausea, vomiting, diarrhea  or change in bowel habits or change in bladder habits, change in stools or change in urine, dysuria, hematuria,  rash, arthralgias, visual complaints, headache, numbness, weakness or ataxia or problems with  walking or coordination,  change in mood or  memory.        Current Meds  Medication Sig  . albuterol (PROAIR HFA) 108 (90 Base) MCG/ACT inhaler Inhale 2 puffs into the lungs every 4 (four) hours as needed for wheezing or shortness of breath.  [name] albuterol (PROVENTIL) (2.5 MG/3ML) 0.083% nebulizer solution Take 2.5 mg by nebulization every 4 (four) hours as needed for wheezing or shortness of breath.  [name] amitriptyline (ELAVIL) 25 MG tablet Take 25 mg by mouth at bedtime.  . ARIPiprazole (ABILIFY) 20 MG tablet Take 1 tablet (20 mg total) by mouth at bedtime. (Patient taking differently: Take 20 mg by mouth 2 (two) times daily. )  . dextromethorphan-guaiFENesin (MUCINEX DM) 30-600 MG 12hr tablet Take 1 tablet by mouth 2 (two) times daily.  . DULoxetine (CYMBALTA) 30 MG capsule Take 30 mg by mouth 2 (two) times daily.  . famotidine (PEPCID) 20 MG tablet One at bedtime  . furosemide (LASIX) 80 MG tablet Take 80 mg by mouth 2 (two) times daily.  [name] omeprazole (PRILOSEC) 40 MG capsule Take 1 capsule (40 mg total) by mouth daily.  . potassium chloride SA  (K-DUR,KLOR-CON) 20 MEQ tablet Take 20 mEq by mouth daily.  . [DISCONTINUED] budesonide-formoterol (SYMBICORT) 160-4.5 MCG/ACT inhaler Inhale 2 puffs into the lungs 2 (two) times daily.  .     . [  lisinopril (PRINIVIL,ZESTRIL) 40 MG tablet Take 1 tablet (40 mg total) by mouth daily.          Objective:   Physical Exam    amb obese bf nad harsh upper airway cough   03/04/2018       240  10/28/2017       252   06/04/17 250 lb (113.4 kg)  05/14/17 250 lb 12.8 oz (113.8 kg)  05/06/17 242 lb (109.8 kg)    Vital signs reviewed - Note on arrival 02 sats 96 % on RA      Clear lungs  1-2+ pitting    HEENT: nl dentition, turbinates bilaterally, and oropharynx. Nl external ear canals without cough reflex   NECK :  without JVD/Nodes/TM/ nl carotid upstrokes bilaterally   LUNGS: no acc muscle use,  Nl contour chest which is clear to A and P bilaterally without cough on insp or exp maneuvers   CV:  RRR  no s3 or murmur or increase in P2, and 2+ pitting both ankles/ feet symmetric    ABD:  soft and nontender with nl inspiratory excursion in the supine position. No bruits or organomegaly appreciated, bowel sounds nl  MS:  Nl gait/ ext warm without deformities, calf tenderness, cyanosis or clubbing No obvious joint restrictions   SKIN: warm and dry without lesions    NEURO:  alert, approp, nl sensorium with  no motor or cerebellar deficits apparent.        I personally reviewed images and agree with radiology impression as follows:  CXR:   11/04/17 No edema or consolidation.  Stable cardiac silhouette.            Assessment:

## 2018-03-04 NOTE — Assessment & Plan Note (Signed)
PFT's  11/10/2016  FEV1 2.06 (92 % ) ratio 81   p 24 % improvement from saba p ? prior to study with DLCO  93/94 % corrects to 102  % for alv volume   - 06/04/2017  After extensive coaching HFA effectiveness =    75% > try symbicort 80 2bid  - Spirometry 10/28/2017  FEV1 1.01 (45%)  Ratio 67  p am symb/saba/smoking  - 10/28/2017   try symb 160 2bid  - 03/04/2018  After extensive coaching inhaler device  effectiveness =    90% try symb 80 2bid due to uacs component    DDX of  difficult airways management almost all start with A and  include Adherence, Ace Inhibitors, Acid Reflux, Active Sinus Disease, Alpha 1 Antitripsin deficiency, Anxiety masquerading as Airways dz,  ABPA,  Allergy(esp in young), Aspiration (esp in elderly), Adverse effects of meds,  Active smokers, A bunch of PE's (a small clot burden can't cause this syndrome unless there is already severe underlying pulm or vascular dz with poor reserve) plus two Bs  = Bronchiectasis and Beta blocker use..and one C= CHF   Adherence is always the initial "prime suspect" and is a multilayered concern that requires a "trust but verify" approach in every patient - starting with knowing how to use medications, especially inhalers, correctly, keeping up with refills and understanding the fundamental difference between maintenance and prns vs those medications only taken for a very short course and then stopped and not refilled.  - see hfa teaching - return with all meds in hand using a trust but verify approach to confirm accurate Medication  Reconciliation The principal here is that until we are certain that the  patients are doing what we've asked, it makes no sense to ask them to do more.    Active smoking see sep a/p  ACEi > try off  ? Acid (or non-acid) GERD > always difficult to exclude as up to 75% of pts in some series report no assoc GI/ Heartburn symptoms> rec continue max (24h)  acid suppression and diet restrictions/ reviewed     ? Anxiety >  usually at the bottom of this list of usual suspects but should be much higher on this pt's based on H and P and note already on psychotropics and may interfere with adherence and also interpretation of response or lack thereof to symptom management which can be quite subjective.    ? Active sinus dz >  augmentin x 10 days  ? Allergy/asthma > Prednisone 10 mg take  4 each am x 2 days,   2 each am x 2 days,  1 each am x 2 days and stop   ? BB effects > In the setting of respiratory symptoms of unknown etiology,  It would be preferable to use bystolic, the most beta -1  selective Beta blocker available in sample form, with bisoprolol the most selective generic choice  on the market, at least on a trial basis, to make sure the spillover Beta 2 effects of the less specific Beta blockers are not contributing to this patient's symptoms.  Will address this at next ov    I had an extended discussion with the patient reviewing all relevant studies completed to date and  lasting 25 minutes of a 40  minute office visit re  severe non-specific but potentially very serious refractory respiratory symptoms of uncertain and potentially multiple  etiologies.   Each maintenance medication was reviewed in detail including  most importantly the difference between maintenance and prns and under what circumstances the prns are to be triggered using an action plan format that is not reflected in the computer generated alphabetically organized AVS.    See device teaching which extended face to face time for this visit   Please see AVS for specific instructions unique to this office visit that I personally wrote and verbalized to the the pt in detail and then reviewed with pt  by my nurse highlighting any changes in therapy/plan of care  recommended at today's visit.

## 2018-03-04 NOTE — Assessment & Plan Note (Signed)
ACE inhibitors are problematic in  pts with airway complaints because  even experienced pulmonologists can't always distinguish ace effects from copd/asthma.  By themselves they don't actually cause a problem, much like oxygen can't by itself start a fire, but they certainly serve as a powerful catalyst or enhancer for any "fire"  or inflammatory process in the upper airway, be it caused by an ET  tube or more commonly reflux (especially in the obese or pts with known GERD or who are on biphoshonates).    In the era of ARB near equivalency until we have a better handle on the reversibility of the airway problem, it just makes sense to avoid ACEI  entirely in the short run and then decide later, having established a level of airway control using a reasonable limited regimen, whether to add back ace but even then being very careful to observe the pt for worsening airway control and number of meds used/ needed to control symptoms.    Also concerned re norvasc in pt with severe peripheral edema and coreg in pt  With documented asthma > f/u cards planned and here in 4 weeks to regroup

## 2018-03-04 NOTE — Assessment & Plan Note (Signed)
4-5 min discussion re active cigarette smoking in addition to office E&M  Ask about tobacco use:  active Advise quitting    I emphasized that although we never turn away smokers from the pulmonary clinic, we do ask that they understand that the recommendations that we make  won't work nearly as well in the presence of continued cigarette exposure.  In fact, we may very well  reach a point where we can't promise to help the patient if he/she can't quit smoking. (We can and will promise to try to help, we just can't promise what we recommend will really work)  Assess willingness not  Assist in quit attempt per pcp when ready  Arrange follow up. Follow up per Primary Care planned

## 2018-03-04 NOTE — Assessment & Plan Note (Signed)
Body mass index is 43.9 kg/m.  -  trending down/ encouraged Lab Results  Component Value Date   TSH 0.72 10/28/2017     Contributing to gerd risk/ doe/reviewed the need and the process to achieve and maintain neg calorie balance > defer f/u primary care including intermittently monitoring thyroid status

## 2018-03-22 ENCOUNTER — Emergency Department (HOSPITAL_COMMUNITY)
Admission: EM | Admit: 2018-03-22 | Discharge: 2018-03-22 | Disposition: A | Discharge status: Home / Self Care | Payer: Medicaid Other | Attending: Emergency Medicine | Admitting: Emergency Medicine

## 2018-03-22 ENCOUNTER — Other Ambulatory Visit: Payer: Self-pay

## 2018-03-22 ENCOUNTER — Emergency Department (HOSPITAL_COMMUNITY): Payer: Medicaid Other

## 2018-03-22 ENCOUNTER — Encounter (HOSPITAL_COMMUNITY): Payer: Self-pay | Admitting: Emergency Medicine

## 2018-03-22 DIAGNOSIS — J441 Chronic obstructive pulmonary disease with (acute) exacerbation: Secondary | ICD-10-CM | POA: Insufficient documentation

## 2018-03-22 DIAGNOSIS — I110 Hypertensive heart disease with heart failure: Secondary | ICD-10-CM | POA: Insufficient documentation

## 2018-03-22 DIAGNOSIS — I5032 Chronic diastolic (congestive) heart failure: Secondary | ICD-10-CM | POA: Diagnosis not present

## 2018-03-22 DIAGNOSIS — Z79899 Other long term (current) drug therapy: Secondary | ICD-10-CM | POA: Insufficient documentation

## 2018-03-22 DIAGNOSIS — F17210 Nicotine dependence, cigarettes, uncomplicated: Secondary | ICD-10-CM | POA: Insufficient documentation

## 2018-03-22 DIAGNOSIS — R600 Localized edema: Secondary | ICD-10-CM | POA: Insufficient documentation

## 2018-03-22 DIAGNOSIS — R2243 Localized swelling, mass and lump, lower limb, bilateral: Secondary | ICD-10-CM | POA: Diagnosis present

## 2018-03-22 DIAGNOSIS — R609 Edema, unspecified: Secondary | ICD-10-CM

## 2018-03-22 HISTORY — DX: Morbid (severe) obesity due to excess calories: E66.01

## 2018-03-22 LAB — BASIC METABOLIC PANEL
Anion gap: 11 (ref 5–15)
BUN: 9 mg/dL (ref 6–20)
CO2: 23 mmol/L (ref 22–32)
Calcium: 8.9 mg/dL (ref 8.9–10.3)
Chloride: 106 mmol/L (ref 98–111)
Creatinine, Ser: 1.12 mg/dL — ABNORMAL HIGH (ref 0.44–1.00)
GFR calc Af Amer: >60 mL/min (ref >60)
GFR calc non Af Amer: 58 mL/min — ABNORMAL LOW (ref >60)
Glucose, Bld: 121 mg/dL — ABNORMAL HIGH (ref 70–99)
Potassium: 3.7 mmol/L (ref 3.5–5.1)
Sodium: 140 mmol/L (ref 135–145)

## 2018-03-22 LAB — CBC
HCT: 42.5 % (ref 36.0–46.0)
Hemoglobin: 13.6 g/dL (ref 12.0–15.0)
MCH: 30.8 pg (ref 26.0–34.0)
MCHC: 32 g/dL (ref 30.0–36.0)
MCV: 96.2 fL (ref 78.0–100.0)
Platelets: 253 10*3/uL (ref 150–400)
RBC: 4.42 MIL/uL (ref 3.87–5.11)
RDW: 14.7 % (ref 11.5–15.5)
WBC: 12.3 10*3/uL — ABNORMAL HIGH (ref 4.0–10.5)

## 2018-03-22 LAB — BRAIN NATRIURETIC PEPTIDE: B Natriuretic Peptide: 29.7 pg/mL (ref 0.0–100.0)

## 2018-03-22 LAB — TROPONIN I: Troponin I: <0.03 ng/mL (ref <0.03)

## 2018-03-22 LAB — I-STAT BETA HCG BLOOD, ED (MC, WL, AP ONLY): I-stat hCG, quantitative: <5 m[IU]/mL (ref <5)

## 2018-03-22 MED ORDER — BENZONATATE 100 MG PO CAPS: 100.0000 mg | ORAL_CAPSULE | Freq: Three times a day (TID) | ORAL | 0 refills | Status: DC | PRN

## 2018-03-22 MED ORDER — DOXYCYCLINE HYCLATE 100 MG PO CAPS: 100.0000 mg | ORAL_CAPSULE | Freq: Two times a day (BID) | ORAL | 0 refills | Status: AC

## 2018-03-22 MED ORDER — FUROSEMIDE 10 MG/ML IJ SOLN
80.0000 mg | Freq: Once | INTRAMUSCULAR | Status: AC
  Administered 2018-03-22: 80 mg via INTRAVENOUS
  Filled 2018-03-22: qty 8

## 2018-03-22 MED ORDER — OPTICHAMBER DIAMOND MISC
Freq: Once | Status: AC
  Administered 2018-03-22: 18:00:00
  Filled 2018-03-22: qty 1

## 2018-03-22 MED ORDER — ALBUTEROL SULFATE HFA 108 (90 BASE) MCG/ACT IN AERS
2.0000 | INHALATION_SPRAY | Freq: Once | RESPIRATORY_TRACT | Status: AC
  Administered 2018-03-22: 2 via RESPIRATORY_TRACT
  Filled 2018-03-22: qty 6.7

## 2018-03-22 MED ORDER — DEXAMETHASONE SODIUM PHOSPHATE 10 MG/ML IJ SOLN
10.0000 mg | Freq: Once | INTRAMUSCULAR | Status: AC
  Administered 2018-03-22: 10 mg via INTRAVENOUS
  Filled 2018-03-22: qty 1

## 2018-03-22 MED ORDER — IPRATROPIUM-ALBUTEROL 0.5-2.5 (3) MG/3ML IN SOLN
3.0000 mL | Freq: Once | RESPIRATORY_TRACT | Status: AC
  Administered 2018-03-22: 3 mL via RESPIRATORY_TRACT
  Filled 2018-03-22: qty 3

## 2018-03-22 NOTE — ED Notes (Signed)
EDP at bedside  

## 2018-03-22 NOTE — ED Notes (Addendum)
Call made to lab- states able to add on BNP

## 2018-03-22 NOTE — ED Triage Notes (Addendum)
Feet and legs swollen x 3 weeks , 2 plus pitting edema, sob and tired all the time  Has hx of CHF, states is on  phenermine for weight loss  Given to her by her PCP

## 2018-03-22 NOTE — Progress Notes (Signed)
RT note: RT instructed patient with use of MDI spacer using the teach back method. Patient displayed understanding and proper use.

## 2018-03-22 NOTE — Discharge Instructions (Addendum)
Keep taking your fluid pill and be cautious about how much fluid you drink throughout the day.  For your shortness of breath, use the albuterol inhaler that I gave you 3-4 times a day for the next 2 days, then every 4-6 hours as needed for wheezing.  I have given you a steroid and antibiotics as well to help with this.  Follow-up with your doctor in 5 to 7 days.

## 2018-03-22 NOTE — ED Notes (Addendum)
Pt ambulated to restroom without complication. Sp02 99% room air. HR 92.

## 2018-03-22 NOTE — ED Notes (Signed)
Attempted blood draw. Otila Kluver, NT and [place], NT attempted blood draw prior. No success.

## 2018-03-22 NOTE — ED Provider Notes (Signed)
[facility] Provider Note   CSN: [id number] Arrival date & time: 03/22/18  1045     History   Chief Complaint Chief Complaint  Patient presents with  . Leg Swelling  . Shortness of Breath    HPI Lori Bean is a 47 y.o. female.  HPI   47 year old female with extensive past medical history as below including schizophrenia, CHF, hypertension, hyperlipidemia, here with shortness of breath and leg swelling.  The patient states that for the last 2 weeks, she is had progressively worsening bilateral leg and arm swelling.  She is gained multiple pounds of weight.  She had associated severe dyspnea.  She has a home health nurse which comes to check on her and she was noticed to be hypoxic to 87 today with tachypnea and subsequently sent here for evaluation.  She feels like her swelling is gotten worse and she was recently started on [employer], after stopping Diovan.  She is been taking her Lasix as prescribed.  She does admit to dietary indiscretion.  Denies any fevers or chills.  No sputum production.  Her shortness of breath is constant, but worse with exertion as well as laying flat and she said difficulty sleeping due to orthopnea.  Past Medical History:  Diagnosis Date  . Anginal pain ([place])    occ from asthma  . Anxiety   . Arthritis   . Asthma   . Bipolar affective disorder ([place])    Schizophrenia  . Bronchitis    hx of  . CHF (congestive heart failure) ([place])   . Depression   . GERD (gastroesophageal reflux disease)   . Heart murmur   . Hypertension    takes meds  . Morbid (severe) obesity due to excess calories ([place])   . Neuropathy    left leg , "from back surgery"  . Overactive bladder   . Schizophrenia ([place])   . Sciatica   . Shortness of breath   . Sleep apnea   . Snoring disorder    Pt stated my boyfriend always wakes me up and tells me to breathe    Patient Active Problem List   Diagnosis Date Noted  . Abnormal  uterine bleeding (AUB) 12/12/2017  . Heart murmur   . CHF (congestive heart failure) ([place])   . Chronic asthma, mild persistent, uncomplicated 80/99/8338  . Dyspnea on exertion 05/05/2017  . Asthma exacerbation in COPD ([place]) 05/05/2017  . Morbid obesity due to excess calories ([place]) 05/05/2017  . Asthma exacerbation 12/17/2016  . 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 Synovial plica of right knee 25/01/3975  . Tear of medial meniscus of right knee, initial encounter 09/28/2016  . OSA (obstructive sleep apnea) 09/23/2016  . Chronic diastolic heart failure ([place]) 04/22/2016  . Prediabetes 04/22/2016  . Cigarette smoker 04/03/2014  . Obesity (BMI 30-39.9) 04/03/2014  . Chest pain 04/03/2014  . Mild cardiomegaly 02/21/2014  . Lumbar spondylosis 12/28/2013  . Extrinsic asthma 06/07/2013  . Anxiety   . Essential hypertension   . Bipolar affective disorder ([place])   . GERD (gastroesophageal reflux disease)   . Arthritis   . Schizophrenia ([place])   . Overactive bladder     Past Surgical History:  Procedure Laterality Date  . BACK SURGERY     3 back surgeries  . CHOLECYSTECTOMY    . COLONOSCOPY WITH PROPOFOL N/A 01/23/2016   Procedure: COLONOSCOPY WITH PROPOFOL;  Surgeon: Wonda Horner, MD;  Location: [facility];  Service: Endoscopy;  Laterality: N/A;  . EYE SURGERY     Metal plate in right eye. Had fracture in right eye  . gallstones reomved    . KNEE ARTHROSCOPY WITH MENISCAL REPAIR Right 09/28/2016   Procedure: KNEE ARTHROSCOPY WITH MENISCAL REPAIR;  Surgeon: Dorna Leitz, MD;  Location: [facility];  Service: Orthopedics;  Laterality: Right;  Right partial meniscectomy and chondroplasty, patellar/femoral joint and medial femoral condyle   . LUMBAR LAMINECTOMY/DECOMPRESSION MICRODISCECTOMY  04/01/2012   Procedure: LUMBAR LAMINECTOMY/DECOMPRESSION MICRODISCECTOMY 2 LEVELS;  Surgeon: Faythe Ghee, MD;  Location: [facility];  Service: Neurosurgery;  Laterality: Left;  Lumbar four-five,  lumbar five sacral one microdiscectomy   . LUMBAR WOUND DEBRIDEMENT  04/29/2012   Procedure: LUMBAR WOUND DEBRIDEMENT;  Surgeon: Faythe Ghee, MD;  Location: [facility];  Service: Neurosurgery;  Laterality: N/A;  lumbar wound debridement  . ROTATOR CUFF REPAIR     Right shoulder  . TUBAL LIGATION       OB History    Gravida  4   Para  2   Term  0   Preterm  2   AB  2   Living  2     SAB  1   TAB  1   Ectopic      Multiple      Live Births  2            Home Medications    Prior to Admission medications   Medication Sig Start Date End Date Taking? Authorizing Provider  albuterol (PROAIR HFA) 108 (90 Base) MCG/ACT inhaler Inhale 2 puffs into the lungs every 4 (four) hours as needed for wheezing or shortness of breath.   Yes [provider]  albuterol (PROVENTIL) (2.5 MG/3ML) 0.083% nebulizer solution Take 2.5 mg by nebulization every 4 (four) hours as needed for wheezing or shortness of breath.   Yes [provider]  amitriptyline (ELAVIL) 25 MG tablet Take 25 mg by mouth at bedtime.   Yes [provider]  amLODipine (NORVASC) 10 MG tablet Take 1 tablet (10 mg total) by mouth daily. 08/31/17 03/22/18 Yes [name], MD  ARIPiprazole (ABILIFY) 20 MG tablet Take 1 tablet (20 mg total) by mouth at bedtime. Patient taking differently: Take 20 mg by mouth 2 (two) times daily.  03/04/15  Yes Lance Bosch, NP  budesonide-formoterol (SYMBICORT) 80-4.5 MCG/ACT inhaler Inhale 2 puffs into the lungs 2 (two) times daily. 03/04/18  Yes Tanda Rockers, MD  carvedilol (COREG) 25 MG tablet Take 1 tablet (25 mg total) 2 (two) times daily by mouth. 07/20/17 03/22/18 Yes [name], MD  DULoxetine (CYMBALTA) 30 MG capsule Take 30 mg by mouth 2 (two) times daily.   Yes [provider]  famotidine (PEPCID) 20 MG tablet One at bedtime 06/04/17  Yes Tanda Rockers, MD  furosemide (LASIX) 80 MG tablet Take 80 mg by mouth 2 (two) times  daily.   Yes [provider]  irbesartan (AVAPRO) 300 MG tablet Take 1 tablet (300 mg total) by mouth daily. 03/04/18  Yes Tanda Rockers, MD  LYRICA 150 MG capsule Take 150 mg by mouth 2 (two) times daily. 03/19/18  Yes [provider]  meloxicam (MOBIC) 15 MG tablet Take 15 mg by mouth daily as needed for pain. 12/21/17  Yes [provider]  omeprazole (PRILOSEC) 40 MG capsule Take 1 capsule (40 mg total) by mouth daily. 12/14/17  Yes [name], MD  phentermine (ADIPEX-P) 37.5 MG tablet Take  37.5 mg by mouth daily. 03/15/18  Yes [provider]  potassium chloride SA (K-DUR,KLOR-CON) 20 MEQ tablet Take 20 mEq by mouth daily.   Yes [provider]  tiZANidine (ZANAFLEX) 4 MG tablet Take 4 mg by mouth every 8 (eight) hours as needed for spasms. 02/14/18  Yes [provider]  traMADol (ULTRAM) 50 MG tablet Take 50 mg by mouth every 8 (eight) hours as needed for pain. 01/31/18  Yes [provider]  triamterene-hydrochlorothiazide (MAXZIDE-25) 37.5-25 MG tablet Take 1 tablet by mouth daily. 02/14/18  Yes [provider]  benzonatate (TESSALON) 100 MG capsule Take 1 capsule (100 mg total) by mouth 3 (three) times daily as needed for cough. 03/22/18   Duffy Bruce, MD  doxycycline (VIBRAMYCIN) 100 MG capsule Take 1 capsule (100 mg total) by mouth 2 (two) times daily for 7 days. 03/22/18 03/29/18  Duffy Bruce, MD  solifenacin (VESICARE) 5 MG tablet Take 5 mg by mouth daily.  04/22/13  [provider]    Family History Family History  Problem Relation Age of Onset  . Diabetes Mother   . Hypertension Mother   . Diabetes Father   . Heart disease Paternal Aunt   . Cancer Paternal Aunt     Social History Social History   Tobacco Use  . Smoking status: Current Every Day Smoker    Packs/day: 0.50    Years: 24.00    Pack years: 12.00    Types: Cigarettes  . Smokeless tobacco: Never Used  Substance Use Topics  . Alcohol  use: No    Alcohol/week: 0.0 oz    Comment: quit Nov. 2014  . Drug use: No     Allergies   Aspirin   Review of Systems Review of Systems  Constitutional: Positive for fatigue. Negative for chills and fever.  HENT: Negative for congestion and rhinorrhea.   Eyes: Negative for visual disturbance.  Respiratory: Positive for cough, chest tightness and shortness of breath. Negative for wheezing.   Cardiovascular: Positive for leg swelling. Negative for chest pain.  Gastrointestinal: Negative for abdominal pain, diarrhea, nausea and vomiting.  Genitourinary: Negative for dysuria and flank pain.  Musculoskeletal: Negative for neck pain and neck stiffness.  Skin: Negative for rash and wound.  Allergic/Immunologic: Negative for immunocompromised state.  Neurological: Negative for syncope, weakness and headaches.  All other systems reviewed and are negative.    Physical Exam Updated Vital Signs BP 121/61   Pulse 79   Temp 98.7 F (37.1 C) (Oral)   Resp (!) 21   Ht 5\' 2"  (1.575 m)   Wt 109.3 kg (241 lb)   LMP 03/19/2018 (Exact Date)   SpO2 97%   BMI 44.08 kg/m   Physical Exam  Constitutional: She is oriented to person, place, and time. She appears well-developed and well-nourished. No distress.  HENT:  Head: Normocephalic and atraumatic.  Eyes: Conjunctivae are normal.  Neck: Neck supple. JVD present.  Cardiovascular: Normal rate, regular rhythm and normal heart sounds. Exam reveals no friction rub.  No murmur heard. Pulmonary/Chest: Effort normal. No respiratory distress. She has decreased breath sounds. She has wheezes.  Abdominal: She exhibits no distension.  Musculoskeletal:       Right lower leg: She exhibits edema (trace).       Left lower leg: She exhibits edema (trace).  Neurological: She is alert and oriented to person, place, and time. She exhibits normal muscle tone.  Skin: Skin is warm. Capillary refill takes less than 2 seconds.  Psychiatric: She has a normal  mood and affect.  Nursing note and vitals reviewed.    ED Treatments / Results  Labs (all labs ordered are listed, but only abnormal results are displayed) Labs Reviewed  BASIC METABOLIC PANEL - Abnormal; Notable for the following components:      Result Value   Glucose, Bld 121 (*)    Creatinine, Ser 1.12 (*)    GFR calc non Af Amer 58 (*)    All other components within normal limits  CBC - Abnormal; Notable for the following components:   WBC 12.3 (*)    All other components within normal limits  TROPONIN I  BRAIN NATRIURETIC PEPTIDE  I-STAT TROPONIN, ED  I-STAT BETA HCG BLOOD, ED ([facility], [facility], [facility] ONLY)    EKG EKG Interpretation  Date/Time:  Tuesday March 22 2018 10:51:29 EDT Ventricular Rate:  87 PR Interval:  154 QRS Duration: 88 QT Interval:  376 QTC Calculation: 452 R Axis:   52 Text Interpretation:  Normal sinus rhythm Cannot rule out Anterior infarct , age undetermined Abnormal ECG No significant change since last tracing Confirmed by Duffy Bruce [phone]) on 03/22/2018 4:54:07 PM   Radiology Dg Chest 2 View  Result Date: 03/22/2018 CLINICAL DATA:  Right-sided chest pain, shortness of breath, cough for 2 weeks EXAM: CHEST - 2 VIEW COMPARISON:  Chest x-ray of 11/04/2017 FINDINGS: There is bibasilar linear atelectasis present. No infiltrate or effusion is seen. Mediastinal and hilar contours are unremarkable. The heart is mildly enlarged and stable. No acute bony abnormality seen with degenerative changes throughout the mid to lower thoracic spine. IMPRESSION: Bibasilar linear atelectasis. No definite pneumonia or pleural effusion. Electronically Signed   By: Ivar Drape M.D.   On: 03/22/2018 11:49    Procedures Procedures (including critical care time)  Medications Ordered in ED Medications  albuterol (PROVENTIL HFA;VENTOLIN HFA) 108 (90 Base) MCG/ACT inhaler 2 puff (has no administration in time range)  AEROCHAMBER PLUS FLO-VU MEDIUM MISC 1 each (has no  administration in time range)  ipratropium-albuterol (DUONEB) 0.5-2.5 (3) MG/3ML nebulizer solution 3 mL (3 mLs Nebulization Given 03/22/18 1509)  ipratropium-albuterol (DUONEB) 0.5-2.5 (3) MG/3ML nebulizer solution 3 mL (3 mLs Nebulization Given 03/22/18 1634)  dexamethasone (DECADRON) injection 10 mg (10 mg Intravenous Given 03/22/18 1630)  furosemide (LASIX) injection 80 mg (80 mg Intravenous Given 03/22/18 1628)     Initial Impression / Assessment and Plan / ED Course  I have reviewed the triage vital signs and the nursing notes.  Pertinent labs & imaging results that were available during my care of the patient were reviewed by me and considered in my medical decision making (see chart for details).   Well-appearing 47 year old female here with subjective worsening of her leg swelling and shortness of breath.  On arrival, EKG is nonischemic and chest x-ray shows atelectasis but no focal pneumonia.  Her lab work is overall reassuring.  She has a mild elevation in her white blood cell count and reports some sputum production, with wheezing on exam.  I suspect her shortness of breath is more so secondary to acute on chronic COPD/bronchitis.  She was given a breathing treatment with excellent improvement in her symptoms and she is now ambulatory throughout the ED without difficulty and no hypoxia.  She is no longer tachypneic.  Otherwise, her troponin, BNP are negative.  She does have some trace edema and she was given an extra dose of Lasix here, but will have her continue her home dose, as  I do not want her to become dehydrated in the setting of her bronchitis.  Otherwise, no signs of ACS.  Her history and exam is not consistent with PE.  Will discharge with outpatient follow-up.    Final Clinical Impressions(s) / ED Diagnoses   Final diagnoses:  Peripheral edema  COPD exacerbation [facility])    ED Discharge Orders        Ordered    doxycycline (VIBRAMYCIN) 100 MG capsule  2 times daily     03/22/18  1700    benzonatate (TESSALON) 100 MG capsule  3 times daily PRN     03/22/18 1700       Duffy Bruce, MD 03/22/18 1702

## 2018-04-01 ENCOUNTER — Ambulatory Visit (INDEPENDENT_AMBULATORY_CARE_PROVIDER_SITE_OTHER): Payer: Medicaid Other | Admitting: Adult Health

## 2018-04-01 ENCOUNTER — Encounter: Payer: Self-pay | Admitting: Adult Health

## 2018-04-01 DIAGNOSIS — J45901 Unspecified asthma with (acute) exacerbation: Secondary | ICD-10-CM

## 2018-04-01 DIAGNOSIS — G4733 Obstructive sleep apnea (adult) (pediatric): Secondary | ICD-10-CM | POA: Diagnosis not present

## 2018-04-01 DIAGNOSIS — F17210 Nicotine dependence, cigarettes, uncomplicated: Secondary | ICD-10-CM

## 2018-04-01 DIAGNOSIS — I5032 Chronic diastolic (congestive) heart failure: Secondary | ICD-10-CM

## 2018-04-01 IMAGING — DX DG CHEST 2V
2 series · 2 of 2 positions shown · non-contrast
Comparison: 12/23/2015

CLINICAL DATA: Left-sided chest pain tonight.  History of CHF.

EXAM:
CHEST  2 VIEW

[chest pa]
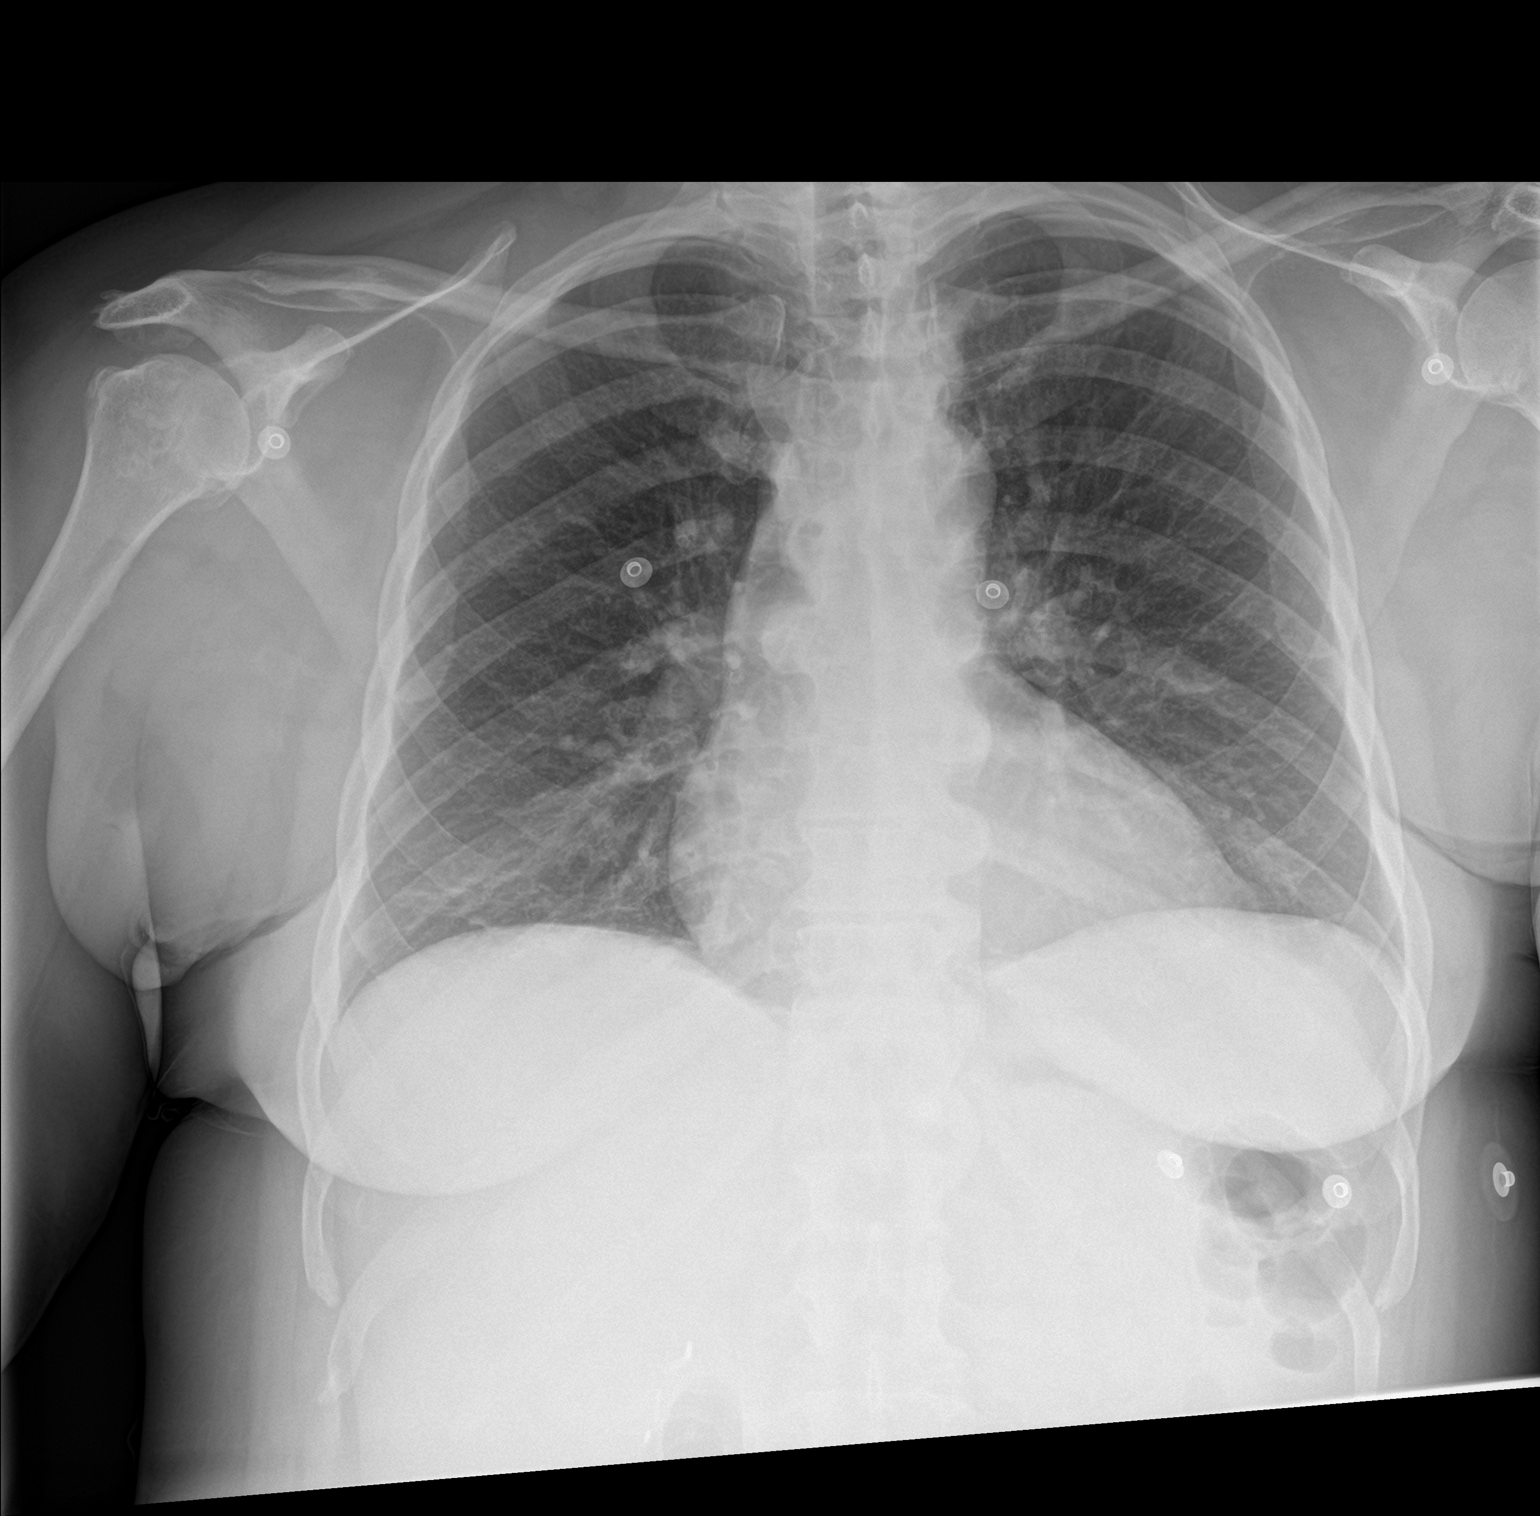

[chest lat]
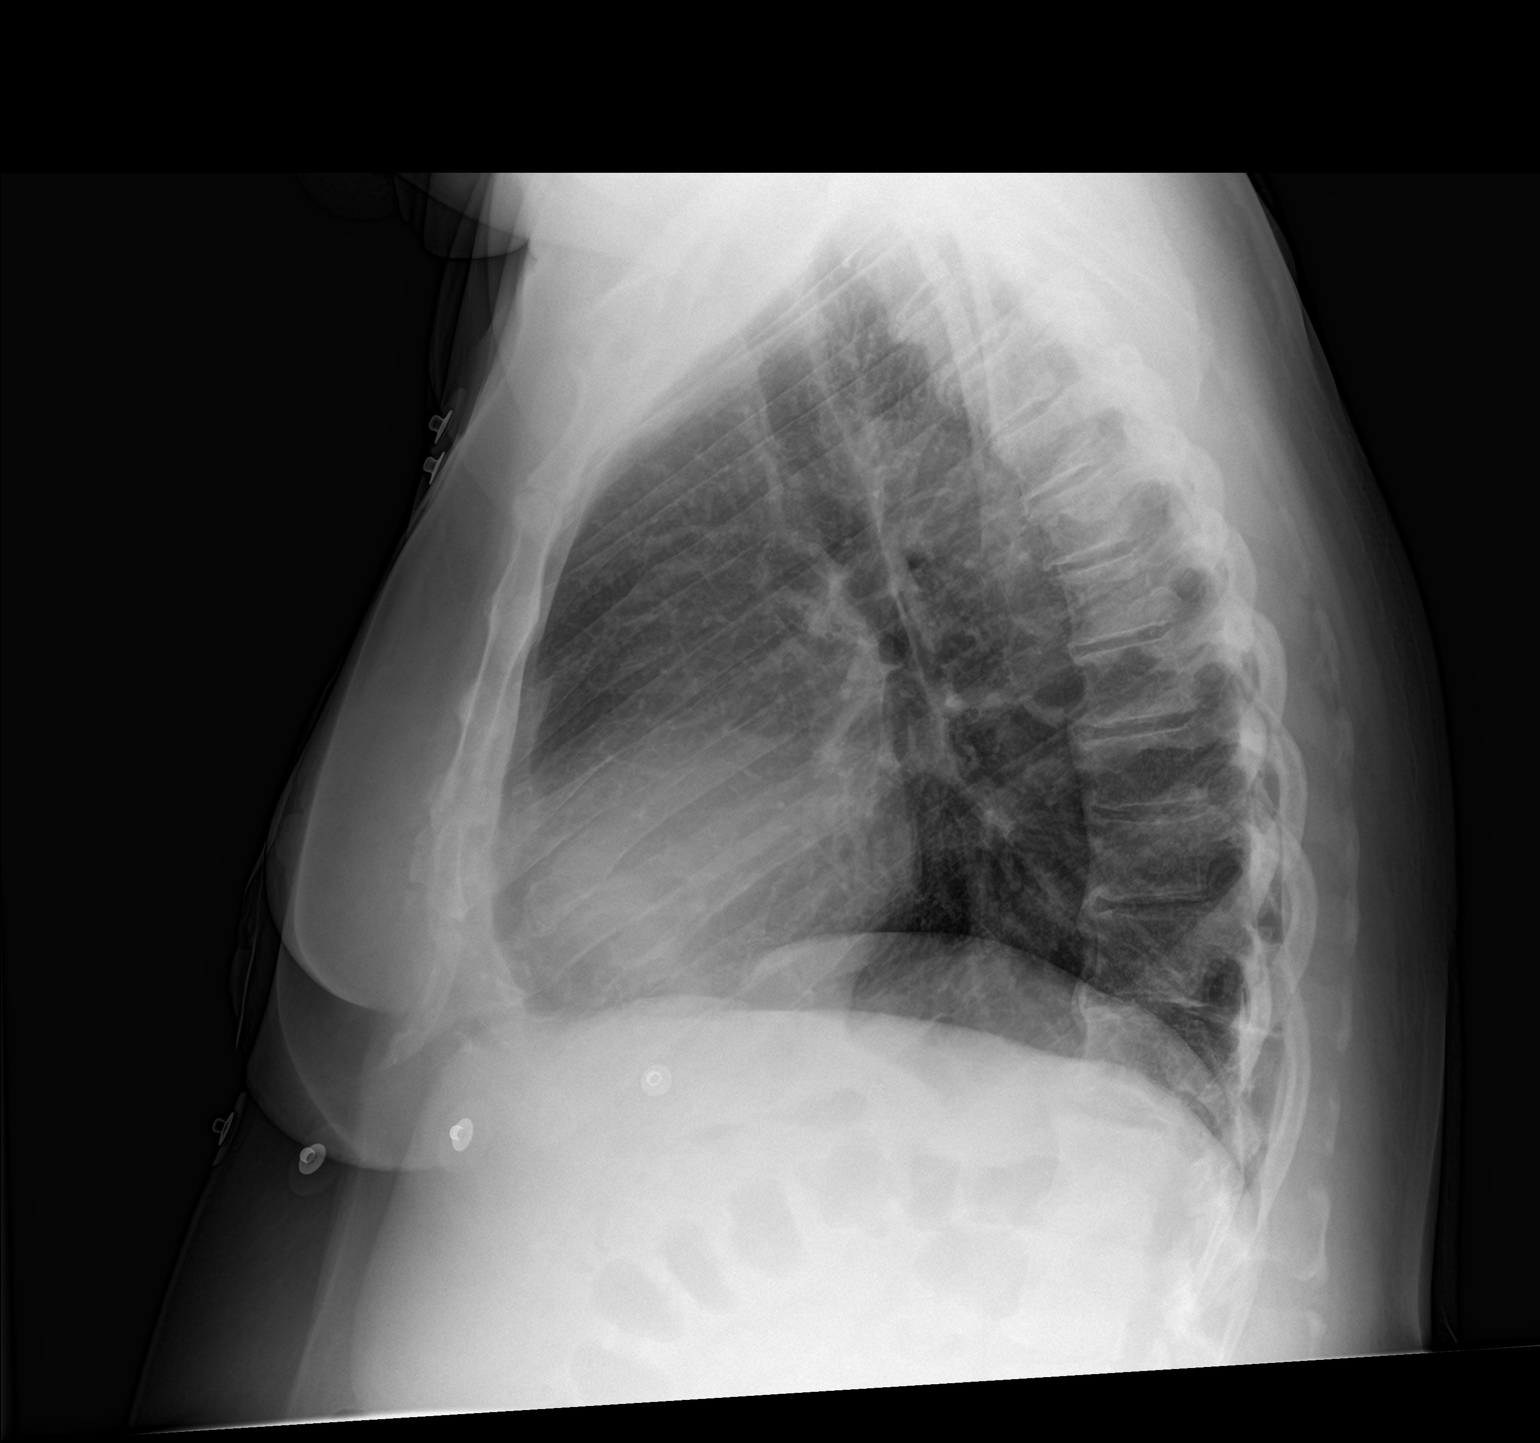

[2 of 2 positions shown; findings below may reference images not displayed]

FINDINGS: Slight linear atelectasis or fibrosis in the lung bases. No focal
consolidation. Normal heart size and pulmonary vascularity. No
blunting of costophrenic angles. No pneumothorax. Degenerative
changes in the spine. Degenerative changes in the shoulders.
IMPRESSION: Atelectasis or fibrosis in the lung bases.

## 2018-04-01 IMAGING — DX DG KNEE COMPLETE 4+V*R*
4 series · 4 of 4 positions shown · non-contrast
Comparison: Radiograph March 02, 2009.

CLINICAL DATA: Right knee pain and swelling after patellar
dislocation 1 month ago.

EXAM:
RIGHT KNEE - COMPLETE 4+ VIEW

[knee ap]
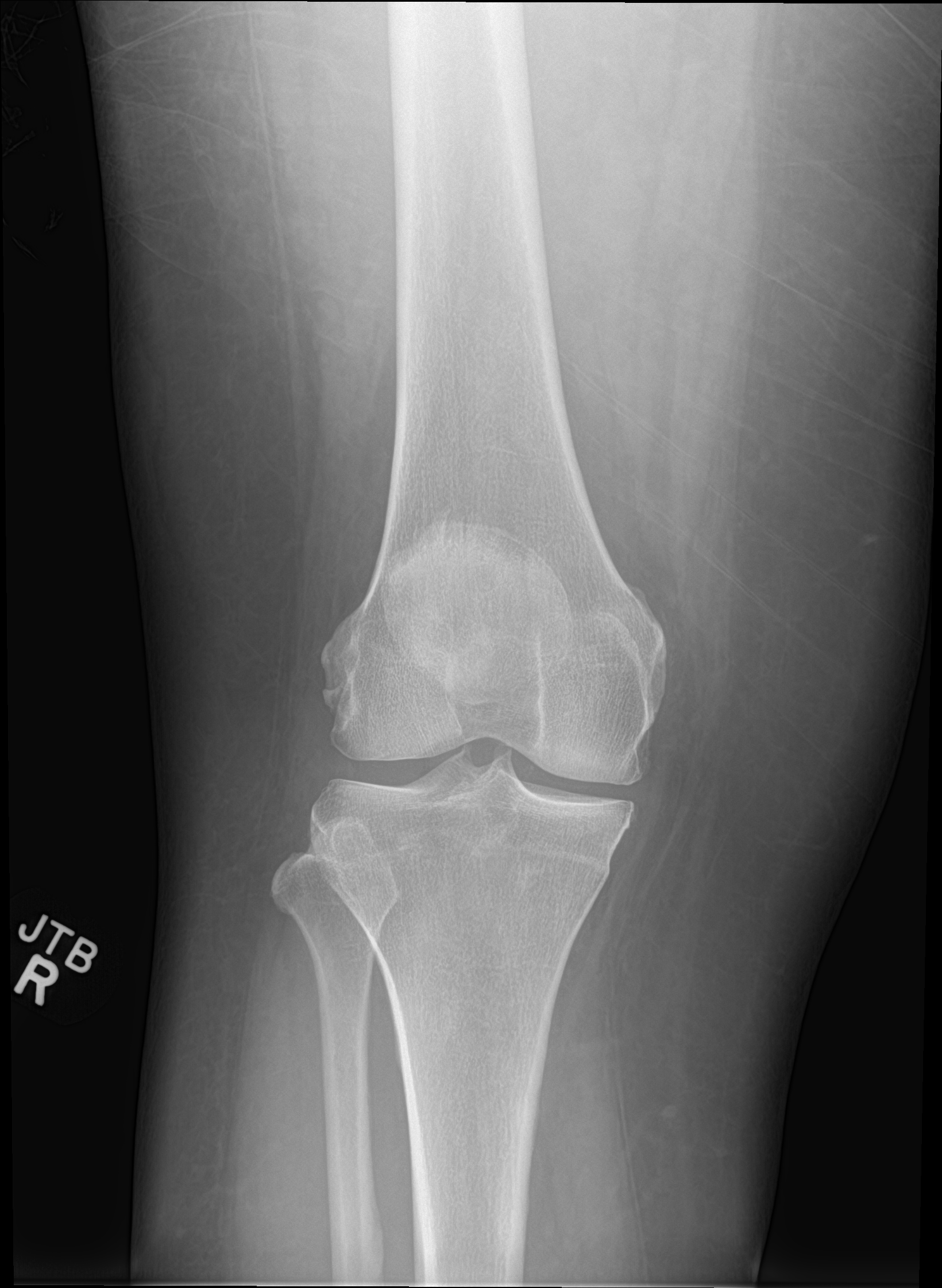

[knee lat]
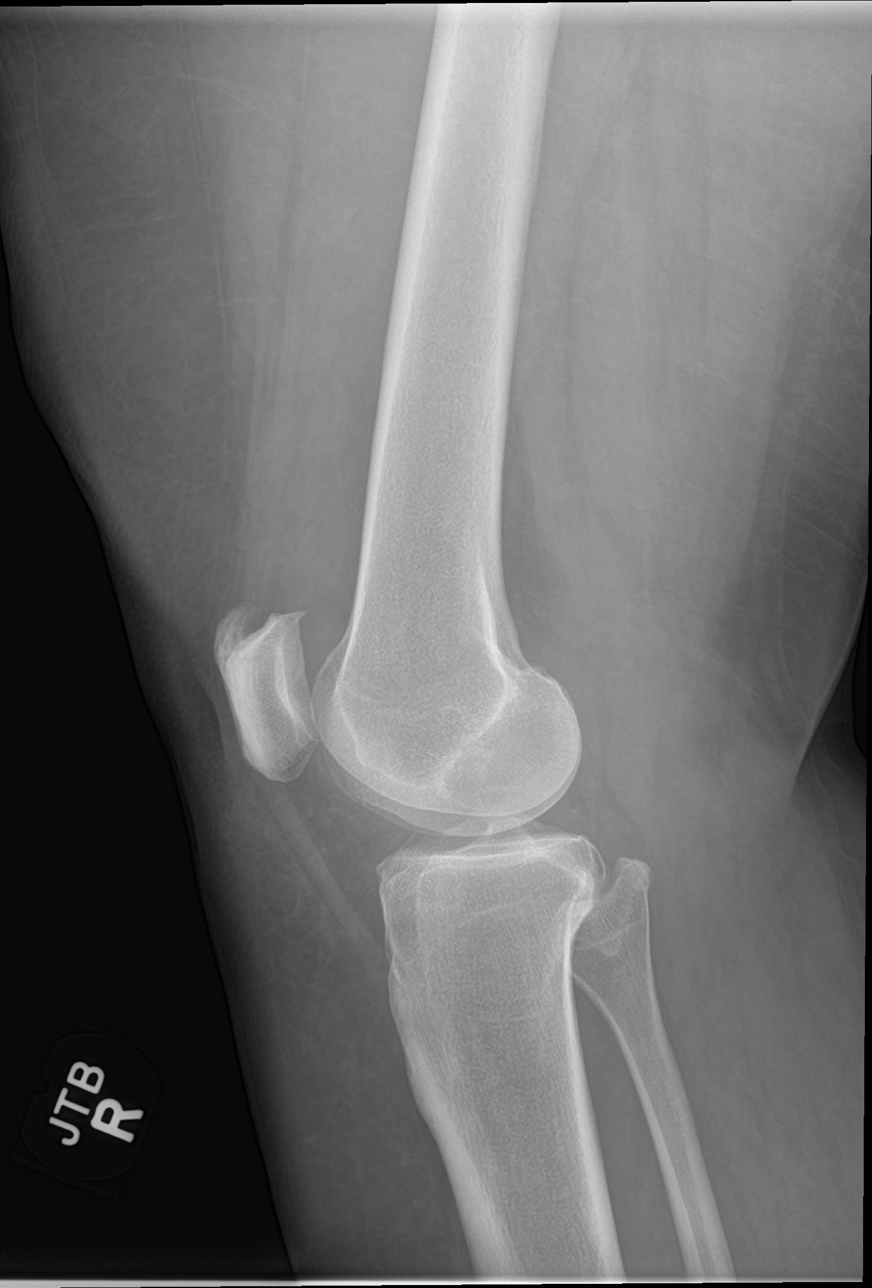

[knee obl (1 of 2)]
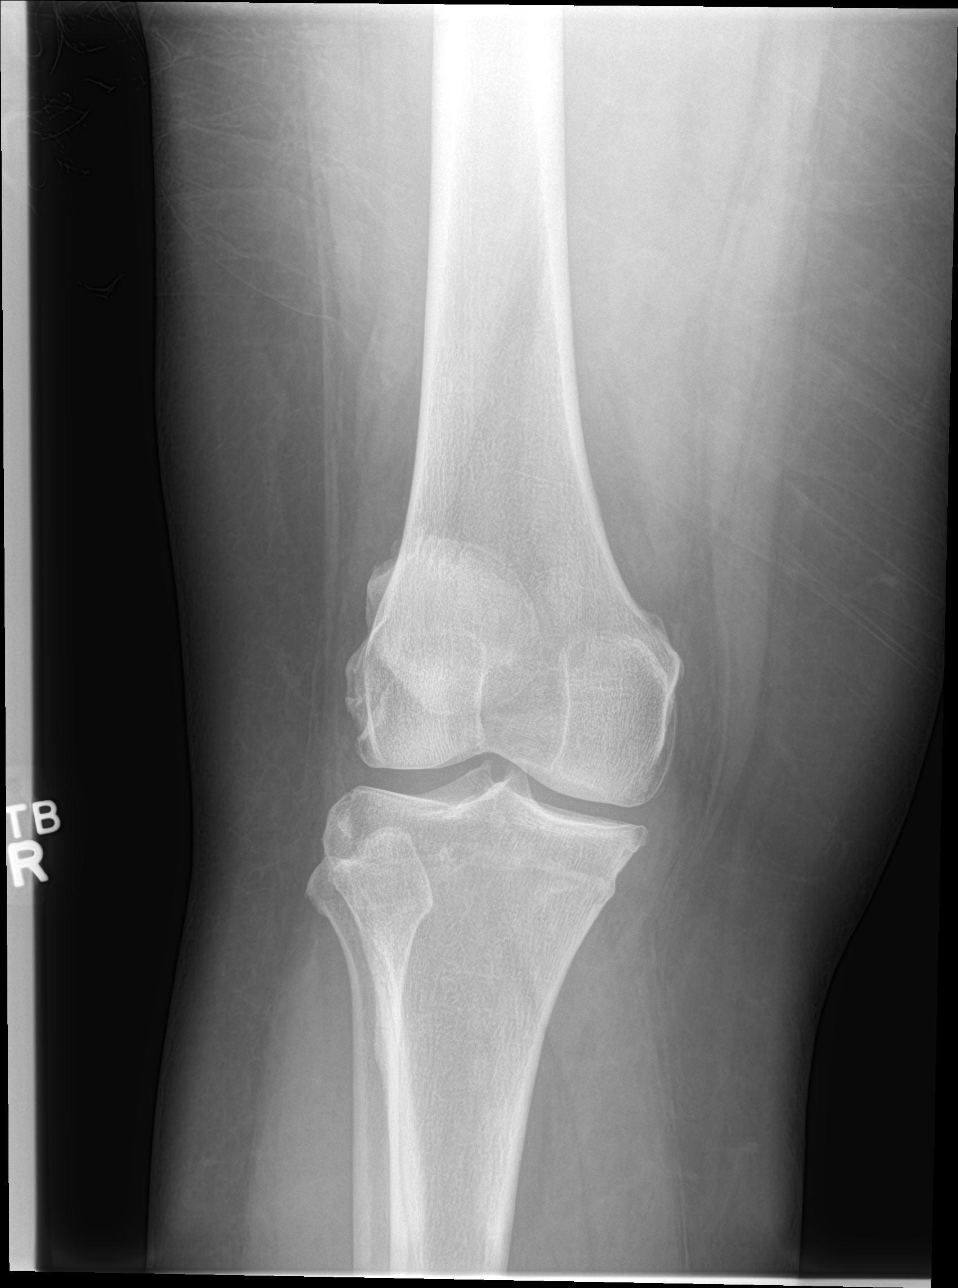

[knee obl (2 of 2)]
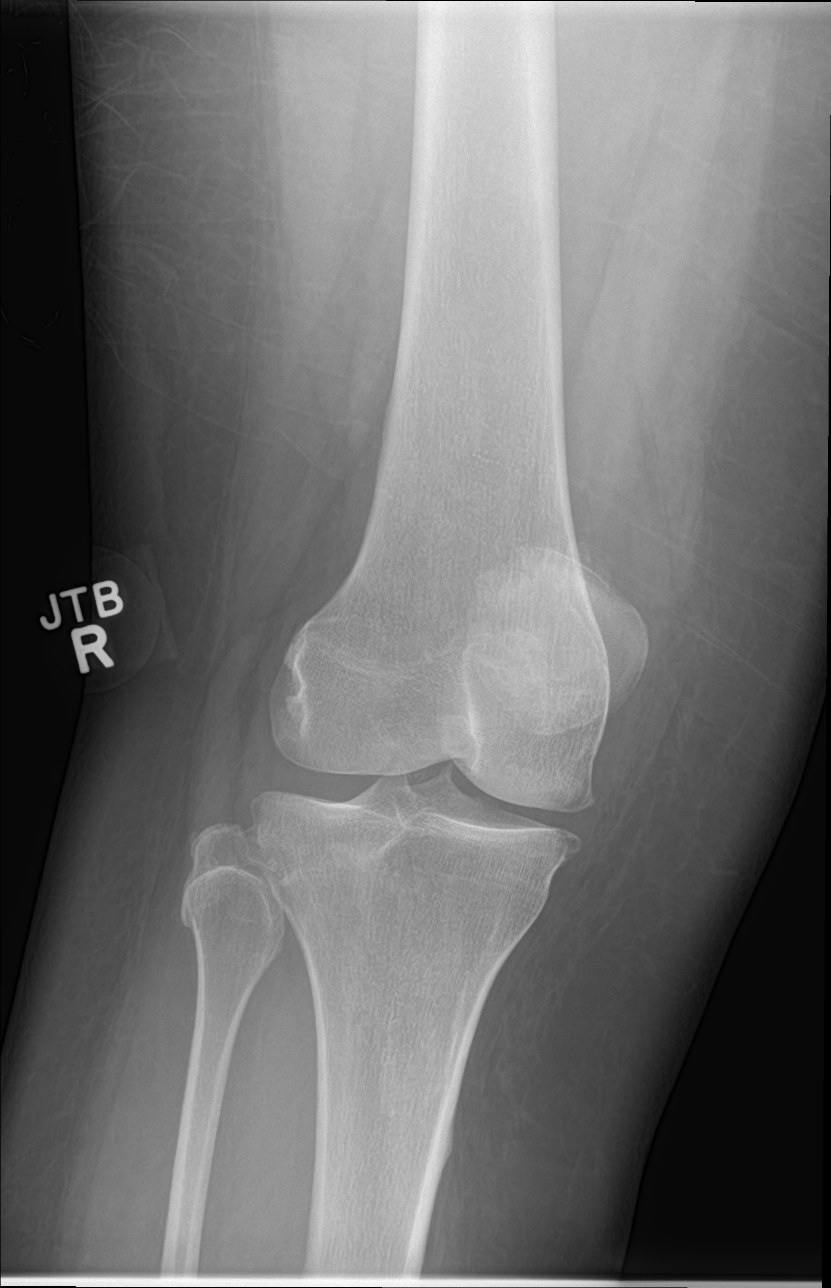

[4 of 4 positions shown; findings below may reference images not displayed]

FINDINGS: No evidence of fracture, dislocation, or joint effusion. Spurring of
superior aspect of patella is noted. Joint spaces are intact. Mild
osteophyte formation is noted medially. Soft tissues are
unremarkable.
IMPRESSION: Mild degenerative changes as described above. No acute abnormality
seen in the right knee.

## 2018-04-01 MED ORDER — BUDESONIDE-FORMOTEROL FUMARATE 160-4.5 MCG/ACT IN AERO: 2.0000 | INHALATION_SPRAY | Freq: Two times a day (BID) | RESPIRATORY_TRACT | 0 refills | Status: DC

## 2018-04-01 MED ORDER — BUDESONIDE-FORMOTEROL FUMARATE 160-4.5 MCG/ACT IN AERO: 2.0000 | INHALATION_SPRAY | Freq: Two times a day (BID) | RESPIRATORY_TRACT | 5 refills | Status: DC

## 2018-04-01 NOTE — Assessment & Plan Note (Signed)
Difficult to control with obesity and chronic edema/lymphedema .  Cont on Laisx  Low salt diet  May need LE wraps  Advised needs follow up with cards in next 1-2 weeks. Has ov on 04/18/18 .  Please contact office for sooner follow up if symptoms do not improve or worsen or seek emergency care

## 2018-04-01 NOTE — Progress Notes (Signed)
@Patient  ID: Lori Bean, female    DOB: Jan 04, 1971, 47 y.o.   MRN: [id number]  Chief Complaint  Patient presents with  . Follow-up    Asthma     Referring provider: [name], [place]  HPI: 47 yo female  smoker seen for sleep evaluation and dyspnea 09/23/16 for evaluation of sleep apnea.Found to have moderate OSA and Asthma  PMH of HTN heart dz, Diastolic CHF , Bipolar and Schizophrenia   TEST  06/2016 sleep study AHI 26/hr PFT's 11/10/2016 FEV1 2.06 (92 % ) ratio 81 p 24 % improvement from saba p ? prior to study with DLCO 93/94 % corrects to 102 % for alv volume  - 06/04/2017 After extensive coaching HFA effectiveness = Spirometry October 28, 2017 showed severe airflow obstruction with FEV1 at 45%, ratio 67, FVC 55%  04/01/2018 Follow up : Asthma and OSA  Patient returns for a one-month follow-up.  Patient has underlying asthma .  Patient was seen last visit with a asthma bronchitic exacerbation.  She was given Augmentin and a prednisone taper.  She was also changed from her ACE inhibitor to Avapro.  Previous PFTs have showed normal lung function with significant bronchodilator response.  Spirometry done earlier this year during symptomatic time showed severe airflow obstruction. Patient does continue to smoke.  Smoking cessation was discussed.  Patient remains on Symbicort twice daily. Feels breathing is some better with less cough . No wheezing .  Is very inactive . Does not exercise.  Seen in the ER 1 week ago diagnosed with a COPD exacerbation given doxycycline.  Patient says symptoms are improved.  Patient has diastolic heart failure.  Previous echo showed grade 2 diastolic dysfunction with preserved EF.[name] She is on Lasix 160 mg daily. She says she is compliant with her meds.  Says despite taking this she says her legs remains swollen.  Pt has lymphedema in arms and legs .   Says she is under a lot of stress with family issues. Support provided.    Has OSA , on BIPAP At bedtime  . Doing well. Cant sleep without it.   Allergies  Allergen Reactions  . Aspirin Nausea And Vomiting    Immunization History  Administered Date(s) Administered  . Influenza Split 06/07/2013, 06/15/2015  . Influenza,inj,Quad PF,6+ Mos 07/22/2017  . Pneumococcal Polysaccharide-23 04/03/2014  . Rabies, IM 05/04/2016, 05/12/2016    Past Medical History:  Diagnosis Date  . Anginal pain ([place])    occ from asthma  . Anxiety   . Arthritis   . Asthma   . Bipolar affective disorder ([place])    Schizophrenia  . Bronchitis    hx of  . CHF (congestive heart failure) ([place])   . Depression   . GERD (gastroesophageal reflux disease)   . Heart murmur   . Hypertension    takes meds  . Morbid (severe) obesity due to excess calories ([place])   . Neuropathy    left leg , "from back surgery"  . Overactive bladder   . Schizophrenia ([place])   . Sciatica   . Shortness of breath   . Sleep apnea   . Snoring disorder    Pt stated my boyfriend always wakes me up and tells me to breathe    Tobacco History: Social History   Tobacco Use  Smoking Status Current Every Day Smoker  . Packs/day: 0.50  . Years: 24.00  . Pack years: 12.00  . Types: Cigarettes  Smokeless Tobacco Never Used  Ready to quit: No Counseling given: Yes   Outpatient Medications Prior to Visit  Medication Sig Dispense Refill  . albuterol (PROAIR HFA) 108 (90 Base) MCG/ACT inhaler Inhale 2 puffs into the lungs every 4 (four) hours as needed for wheezing or shortness of breath.    [name] albuterol (PROVENTIL) (2.5 MG/3ML) 0.083% nebulizer solution Take 2.5 mg by nebulization every 4 (four) hours as needed for wheezing or shortness of breath.    [name] amitriptyline (ELAVIL) 25 MG tablet Take 25 mg by mouth at bedtime.    . ARIPiprazole (ABILIFY) 20 MG tablet Take 1 tablet (20 mg total) by mouth at bedtime. (Patient taking differently: Take 20 mg by mouth 2 (two) times daily. ) 30 tablet 0  . DULoxetine  (CYMBALTA) 30 MG capsule Take 30 mg by mouth 2 (two) times daily.    . famotidine (PEPCID) 20 MG tablet One at bedtime 30 tablet 11  . furosemide (LASIX) 80 MG tablet Take 80 mg by mouth 2 (two) times daily.    . irbesartan (AVAPRO) 300 MG tablet Take 1 tablet (300 mg total) by mouth daily. 30 tablet 11  . LYRICA 150 MG capsule Take 150 mg by mouth 2 (two) times daily.  2  . meloxicam (MOBIC) 15 MG tablet Take 15 mg by mouth daily as needed for pain.  0  . omeprazole (PRILOSEC) 40 MG capsule Take 1 capsule (40 mg total) by mouth daily. 90 capsule 0  . phentermine (ADIPEX-P) 37.5 MG tablet Take 37.5 mg by mouth daily.  1  . potassium chloride SA (K-DUR,KLOR-CON) 20 MEQ tablet Take 20 mEq by mouth daily.    [name] tiZANidine (ZANAFLEX) 4 MG tablet Take 4 mg by mouth every 8 (eight) hours as needed for spasms.  1  . triamterene-hydrochlorothiazide (MAXZIDE-25) 37.5-25 MG tablet Take 1 tablet by mouth daily.  5  . budesonide-formoterol (SYMBICORT) 80-4.5 MCG/ACT inhaler Inhale 2 puffs into the lungs 2 (two) times daily. 1 Inhaler 11  . amLODipine (NORVASC) 10 MG tablet Take 1 tablet (10 mg total) by mouth daily. 90 tablet 0  . benzonatate (TESSALON) 100 MG capsule Take 1 capsule (100 mg total) by mouth 3 (three) times daily as needed for cough. (Patient not taking: Reported on 04/01/2018) 21 capsule 0  . carvedilol (COREG) 25 MG tablet Take 1 tablet (25 mg total) 2 (two) times daily by mouth. 180 tablet 3  . traMADol (ULTRAM) 50 MG tablet Take 50 mg by mouth every 8 (eight) hours as needed for pain.  0   No facility-administered medications prior to visit.      Review of Systems  Constitutional:   No  weight loss, night sweats,  Fevers, chills, + fatigue, or  lassitude.  HEENT:   No headaches,  Difficulty swallowing,  Tooth/dental problems, or  Sore throat,                No sneezing, itching, ear ache, nasal congestion, post nasal drip,   CV:  No chest pain,  Orthopnea, PND, +swelling in lower  extremities, anasarca, dizziness, palpitations, syncope.   GI  No heartburn, indigestion, abdominal pain, nausea, vomiting, diarrhea, change in bowel habits, loss of appetite, bloody stools.   Resp:  No chest wall deformity  Skin: no rash or lesions.  GU: no dysuria, change in color of urine, no urgency or frequency.  No flank pain, no hematuria   MS:  No joint pain or swelling.  No decreased range of motion.  No back  pain.    Physical Exam  BP 134/80 (BP Location: Left Arm, Cuff Size: Normal)   Pulse 72   Ht 5\' 2"  (1.575 m)   Wt 252 lb 6.4 oz (114.5 kg)   LMP 03/19/2018 (Exact Date)   SpO2 100%   BMI 46.16 kg/m   GEN: A/Ox3; pleasant , NAD, obese    HEENT:  [place]/AT,  EACs-clear, TMs-wnl, NOSE-clear, THROAT-clear, no lesions, no postnasal drip or exudate noted.   NECK:  Supple w/ fair ROM; no JVD; normal carotid impulses w/o bruits; no thyromegaly or nodules palpated; no lymphadenopathy.    RESP  Clear  P & A; w/o, wheezes/ rales/ or rhonchi. no accessory muscle use, no dullness to percussion  CARD:  RRR, no m/r/g, 1-2  peripheral edema, pulses intact, no cyanosis or clubbing. Lymphedema of UE/LE   GI:   Soft & nt; nml bowel sounds; no organomegaly or masses detected.   Musco: Warm bil, no deformities or joint swelling noted.   Neuro: alert, no focal deficits noted.    Skin: Warm, no lesions or rashes    Lab Results:  CBC    Component Value Date/Time   WBC 12.3 (H) 03/22/2018 1128   RBC 4.42 03/22/2018 1128   HGB 13.6 03/22/2018 1128   HCT 42.5 03/22/2018 1128   PLT 253 03/22/2018 1128   MCV 96.2 03/22/2018 1128   MCH 30.8 03/22/2018 1128   MCHC 32.0 03/22/2018 1128   RDW 14.7 03/22/2018 1128   LYMPHSABS 0.7 11/04/2017 1230   MONOABS 1.2 (H) 11/04/2017 1230   EOSABS 0.1 11/04/2017 1230   BASOSABS 0.0 11/04/2017 1230    BMET    Component Value Date/Time   NA 140 03/22/2018 1128   K 3.7 03/22/2018 1128   CL 106 03/22/2018 1128   CO2 23 03/22/2018  1128   GLUCOSE 121 (H) 03/22/2018 1128   BUN 9 03/22/2018 1128   CREATININE 1.12 (H) 03/22/2018 1128   CREATININE 0.62 10/09/2016 0857   CALCIUM 8.9 03/22/2018 1128   GFRNONAA 58 (L) 03/22/2018 1128   GFRNONAA 69 06/19/2015 1150   GFRAA >60 03/22/2018 1128   GFRAA 80 06/19/2015 1150    BNP    Component Value Date/Time   BNP 29.7 03/22/2018 1128   BNP 15.2 09/18/2016 1058    ProBNP    Component Value Date/Time   PROBNP 14.0 10/28/2017 1039    Imaging: Dg Chest 2 View  Result Date: 03/22/2018 CLINICAL DATA:  Right-sided chest pain, shortness of breath, cough for 2 weeks EXAM: CHEST - 2 VIEW COMPARISON:  Chest x-ray of 11/04/2017 FINDINGS: There is bibasilar linear atelectasis present. No infiltrate or effusion is seen. Mediastinal and hilar contours are unremarkable. The heart is mildly enlarged and stable. No acute bony abnormality seen with degenerative changes throughout the mid to lower thoracic spine. IMPRESSION: Bibasilar linear atelectasis. No definite pneumonia or pleural effusion. Electronically Signed   By: Ivar Drape M.D.   On: 03/22/2018 11:49     Assessment & Plan:   Asthma exacerbation Prone to recurrent exacerbations.  Patient is encouraged on smoking cessation. We will increase Symbicort to 160  Plan  Patient Instructions  Increase Symbicort 160/4.38mcg 2 puffs Twice daily  , rinse after use.  Mucinex DM Twice daily  As needed  Cough/congestion  Work on not smoking .  Continue on BIPAP At bedtime   Low salt diet , keep legs elevated.  Follow up with Cardiology as discussed.  Follow up with Dr. Melvyn Novas  In 3 months and As needed   Please contact office for sooner follow up if symptoms do not improve or worsen or seek emergency care        Chronic diastolic heart failure ([place]) Difficult to control with obesity and chronic edema/lymphedema .  Cont on Laisx  Low salt diet  May need LE wraps  Advised needs follow up with cards in next 1-2 weeks. Has ov  on 04/18/18 .  Please contact office for sooner follow up if symptoms do not improve or worsen or seek emergency care    Cigarette smoker Smoking cessation   OSA (obstructive sleep apnea) Cont on BIPAP .       Rexene Edison, NP 04/01/2018

## 2018-04-01 NOTE — Assessment & Plan Note (Signed)
Cont on BIPAP .

## 2018-04-01 NOTE — Assessment & Plan Note (Signed)
Prone to recurrent exacerbations.  Patient is encouraged on smoking cessation. We will increase Symbicort to 160  Plan  Patient Instructions  Increase Symbicort 160/4.23mcg 2 puffs Twice daily  , rinse after use.  Mucinex DM Twice daily  As needed  Cough/congestion  Work on not smoking .  Continue on BIPAP At bedtime   Low salt diet , keep legs elevated.  Follow up with Cardiology as discussed.  Follow up with Dr. Melvyn Novas  In 3 months and As needed   Please contact office for sooner follow up if symptoms do not improve or worsen or seek emergency care

## 2018-04-01 NOTE — Assessment & Plan Note (Signed)
Smoking cessation  

## 2018-04-01 NOTE — Patient Instructions (Addendum)
Increase Symbicort 160/4.10mcg 2 puffs Twice daily  , rinse after use.  Mucinex DM Twice daily  As needed  Cough/congestion  Work on not smoking .  Continue on BIPAP At bedtime   Low salt diet , keep legs elevated.  Follow up with Cardiology as discussed.  Follow up with Dr. Melvyn Novas  In 3 months and As needed   Please contact office for sooner follow up if symptoms do not improve or worsen or seek emergency care

## 2018-04-02 NOTE — Progress Notes (Signed)
Chart and office note reviewed in detail  > agree with a/p as outlined    

## 2018-04-06 ENCOUNTER — Encounter: Payer: Self-pay | Admitting: Cardiovascular Disease

## 2018-04-06 ENCOUNTER — Ambulatory Visit (INDEPENDENT_AMBULATORY_CARE_PROVIDER_SITE_OTHER): Payer: Medicaid Other | Admitting: Cardiovascular Disease

## 2018-04-06 VITALS — BP 132/84 | HR 88 | Ht 62.0 in | Wt 255.4 lb

## 2018-04-06 DIAGNOSIS — R0600 Dyspnea, unspecified: Secondary | ICD-10-CM

## 2018-04-06 DIAGNOSIS — I5032 Chronic diastolic (congestive) heart failure: Secondary | ICD-10-CM

## 2018-04-06 DIAGNOSIS — I10 Essential (primary) hypertension: Secondary | ICD-10-CM

## 2018-04-06 DIAGNOSIS — R0609 Other forms of dyspnea: Secondary | ICD-10-CM

## 2018-04-06 DIAGNOSIS — R0602 Shortness of breath: Secondary | ICD-10-CM

## 2018-04-06 DIAGNOSIS — M7989 Other specified soft tissue disorders: Secondary | ICD-10-CM | POA: Diagnosis not present

## 2018-04-06 DIAGNOSIS — I110 Hypertensive heart disease with heart failure: Secondary | ICD-10-CM

## 2018-04-06 NOTE — Patient Instructions (Addendum)
Medication Instructions:  Your physician recommends that you continue on your current medications as directed. Please refer to the Current Medication list given to you today.  Labwork: none  Testing/Procedures: Your physician has requested that you have an echocardiogram. Echocardiography is a painless test that uses sound waves to create images of your heart. It provides your doctor with information about the size and shape of your heart and how well your heart's chambers and valves are working. This procedure takes approximately one hour. There are no restrictions for this procedure. [address]  Follow-Up: Your physician recommends that you schedule a follow-up appointment in: 2 MONTHS   Any Other Special Instructions Will Be Listed Below (If Applicable).  [place]   If you need a refill on your cardiac medications before your next appointment, please call your pharmacy.

## 2018-04-06 NOTE — Progress Notes (Signed)
Cardiology Office Note   Date:  04/06/2018   ID:  Lori Bean, DOB 15-Jul-1971, MRN [id number]  PCP:  [name], PA  Cardiologist:   [name], MD   Chief Complaint  Patient presents with  . Follow-up  . Shortness of Breath    occasionally.  . Edema    legs and feet.    Patient ID: Lori Bean is a 47 y.o. female with chronic diastolic heart failure, hypertensive heart disease, OSA, schizophrenia, bipolar disorder, obesity and prior PE who presents for follow up.  Lori Bean was initially seen 05/2015 with fatigue and shortness of breath.  At the time she endorsed heart failure symptoms, including lower extremity edema, orthopnea and shortness of breath.  She also reported atypical chest pain.  After that appointment she was referred to the hospital and had a [place] 05/26/15 that was negative for ischemia and revealed LVEF 57%.  She had an echo 05/24/15 with LVEF 60-65% and grade 2 diastolic dysfunction.    Lori Bean has been struggling with lower extremity swelling.  Her weight went up by 8 pounds in the last several days.  She had a salty meal last night but in general tries to limit her sodium intake.  She reports that her blood pressure has been well-controlled at home.  It is usually in the 102H to 852D systolic.  She uses her CPAP regularly.  However she still reports orthopnea.  She saw her PCP who did not feel comfortable increasing her furosemide and recommended that she see cardiology.  Lori Bean has struggles with exertional dyspnea.  Her exercise is limited by pain in her back, hip, and legs.  She has no exertional chest pain but sometimes has chest pressure when she lies down.  In the past amlodipine was discontinued and she continued to have lower extremity swelling.   Past Medical History:  Diagnosis Date  . Anginal pain ([place])    occ from asthma  . Anxiety   . Arthritis   . Asthma   . Bipolar affective disorder  ([place])    Schizophrenia  . Bronchitis    hx of  . CHF (congestive heart failure) ([place])   . Depression   . GERD (gastroesophageal reflux disease)   . Heart murmur   . Hypertension    takes meds  . Morbid (severe) obesity due to excess calories ([place])   . Neuropathy    left leg , "from back surgery"  . Overactive bladder   . Schizophrenia ([place])   . Sciatica   . Shortness of breath   . Sleep apnea   . Snoring disorder    Pt stated my boyfriend always wakes me up and tells me to breathe    Past Surgical History:  Procedure Laterality Date  . BACK SURGERY     3 back surgeries  . CHOLECYSTECTOMY    . COLONOSCOPY WITH PROPOFOL N/A 01/23/2016   Procedure: COLONOSCOPY WITH PROPOFOL;  Surgeon: Wonda Horner, MD;  Location: [facility];  Service: Endoscopy;  Laterality: N/A;  . EYE SURGERY     Metal plate in right eye. Had fracture in right eye  . gallstones reomved    . KNEE ARTHROSCOPY WITH MENISCAL REPAIR Right 09/28/2016   Procedure: KNEE ARTHROSCOPY WITH MENISCAL REPAIR;  Surgeon: Dorna Leitz, MD;  Location: [facility];  Service: Orthopedics;  Laterality: Right;  Right partial meniscectomy and chondroplasty, patellar/femoral joint and medial femoral condyle   . LUMBAR LAMINECTOMY/DECOMPRESSION  MICRODISCECTOMY  04/01/2012   Procedure: LUMBAR LAMINECTOMY/DECOMPRESSION MICRODISCECTOMY 2 LEVELS;  Surgeon: Faythe Ghee, MD;  Location: [facility];  Service: Neurosurgery;  Laterality: Left;  Lumbar four-five, lumbar five sacral one microdiscectomy   . LUMBAR WOUND DEBRIDEMENT  04/29/2012   Procedure: LUMBAR WOUND DEBRIDEMENT;  Surgeon: Faythe Ghee, MD;  Location: [facility];  Service: Neurosurgery;  Laterality: N/A;  lumbar wound debridement  . ROTATOR CUFF REPAIR     Right shoulder  . TUBAL LIGATION       Current Outpatient Medications  Medication Sig Dispense Refill  . albuterol (PROAIR HFA) 108 (90 Base) MCG/ACT inhaler Inhale 2 puffs into the lungs every 4 (four) hours as  needed for wheezing or shortness of breath.    [name] albuterol (PROVENTIL) (2.5 MG/3ML) 0.083% nebulizer solution Take 2.5 mg by nebulization every 4 (four) hours as needed for wheezing or shortness of breath.    [name] amitriptyline (ELAVIL) 25 MG tablet Take 25 mg by mouth at bedtime.    [name] amLODipine (NORVASC) 10 MG tablet Take 1 tablet (10 mg total) by mouth daily. 90 tablet 0  . ARIPiprazole (ABILIFY) 20 MG tablet Take 1 tablet (20 mg total) by mouth at bedtime. (Patient taking differently: Take 20 mg by mouth 2 (two) times daily. ) 30 tablet 0  . benzonatate (TESSALON) 100 MG capsule Take 1 capsule (100 mg total) by mouth 3 (three) times daily as needed for cough. 21 capsule 0  . budesonide-formoterol (SYMBICORT) 160-4.5 MCG/ACT inhaler Inhale 2 puffs into the lungs 2 (two) times daily. 1 Inhaler 5  . budesonide-formoterol (SYMBICORT) 160-4.5 MCG/ACT inhaler Inhale 2 puffs into the lungs 2 (two) times daily for 1 day. 1 Inhaler 0  . carvedilol (COREG) 25 MG tablet Take 1 tablet (25 mg total) 2 (two) times daily by mouth. 180 tablet 3  . DULoxetine (CYMBALTA) 30 MG capsule Take 30 mg by mouth 2 (two) times daily.    . famotidine (PEPCID) 20 MG tablet One at bedtime 30 tablet 11  . furosemide (LASIX) 80 MG tablet Take 80 mg by mouth 2 (two) times daily.    . irbesartan (AVAPRO) 300 MG tablet Take 1 tablet (300 mg total) by mouth daily. 30 tablet 11  . LYRICA 150 MG capsule Take 150 mg by mouth 2 (two) times daily.  2  . meloxicam (MOBIC) 15 MG tablet Take 15 mg by mouth daily as needed for pain.  0  . omeprazole (PRILOSEC) 40 MG capsule Take 1 capsule (40 mg total) by mouth daily. 90 capsule 0  . phentermine (ADIPEX-P) 37.5 MG tablet Take 37.5 mg by mouth daily.  1  . potassium chloride SA (K-DUR,KLOR-CON) 20 MEQ tablet Take 20 mEq by mouth daily.    [name] tiZANidine (ZANAFLEX) 4 MG tablet Take 4 mg by mouth every 8 (eight) hours as needed for spasms.  1  . traMADol (ULTRAM) 50 MG tablet Take 50 mg by mouth  every 8 (eight) hours as needed for pain.  0  . triamterene-hydrochlorothiazide (MAXZIDE-25) 37.5-25 MG tablet Take 1 tablet by mouth daily.  5   No current facility-administered medications for this visit.     Allergies:   Aspirin    Social History:  The patient  reports that she has been smoking cigarettes.  She has a 12.00 pack-year smoking history. She has never used smokeless tobacco. She reports that she does not drink alcohol or use drugs.   Family History:  The patient's family history  includes Cancer in her paternal aunt; Diabetes in her father and mother; Heart disease in her paternal aunt; Hypertension in her mother.    ROS:  Please see the history of present illness.   Otherwise, review of systems are positive for none.   All other systems are reviewed and negative.    PHYSICAL EXAM: VS:  BP 132/84   Pulse 88   Ht 5\' 2"  (1.575 m)   Wt 255 lb 6.4 oz (115.8 kg)   LMP 03/19/2018 (Exact Date)   BMI 46.71 kg/m  , BMI Body mass index is 46.71 kg/m. GENERAL:  Well appearing HEENT: Pupils equal round and reactive, fundi not visualized, oral mucosa unremarkable NECK:  No jugular venous distention, waveform within normal limits, carotid upstroke brisk and symmetric, no bruits LUNGS:  Clear to auscultation bilaterally HEART:  RRR.  PMI not displaced or sustained,S1 and S2 within normal limits, no S3, no S4, no clicks, no rubs, no murmurs ABD:  Flat, positive bowel sounds normal in frequency in pitch, no bruits, no rebound, no guarding, no midline pulsatile mass, no hepatomegaly, no splenomegaly EXT:  2 plus pulses throughout, 3+ UE and LE non-pitting edema, no cyanosis no clubbing SKIN:  No rashes no nodules NEURO:  Cranial nerves II through XII grossly intact, motor grossly intact throughout PSYCH:  Cognitively intact, oriented to person place and time   EKG:  EKG is not ordered today. The ekg ordered 09/18/16 demonstrates sinus rhythm. Rate 77 bpm.  Non-specific T wave  changes.    Echo 05/24/15: Study Conclusions  - Left ventricle: The cavity size was normal. Wall thickness was  increased in a pattern of mild LVH. Systolic function was normal.  The estimated ejection fraction was in the range of 60% to 65%.  Wall motion was normal; there were no regional wall motion  abnormalities. Features are consistent with a pseudonormal left  ventricular filling pattern, with concomitant abnormal relaxation  and increased filling pressure (grade 2 diastolic dysfunction).  Lexiscan Myoview 05/26/15: IMPRESSION: 1. No reversible ischemia or infarction.  2. Mild septal hypokinesis.  3. Left ventricular ejection fraction 57%  4. Low-risk stress test findings*.   Recent Labs: 07/07/2017: ALT 28 10/28/2017: Pro B Natriuretic peptide (BNP) 14.0; TSH 0.72 03/22/2018: B Natriuretic Peptide 29.7; BUN 9; Creatinine, Ser 1.12; Hemoglobin 13.6; Platelets 253; Potassium 3.7; Sodium 140    Lipid Panel    Component Value Date/Time   CHOL 133 05/25/2015 0333   TRIG 105 05/25/2015 0333   HDL 50 05/25/2015 0333   CHOLHDL 2.7 05/25/2015 0333   VLDL 21 05/25/2015 0333   LDLCALC 62 05/25/2015 0333      Wt Readings from Last 3 Encounters:  04/06/18 255 lb 6.4 oz (115.8 kg)  04/01/18 252 lb 6.4 oz (114.5 kg)  03/22/18 241 lb (109.3 kg)     Other studies Reviewed: Additional studies/ records that were reviewed today include: . Review of the above records demonstrates:  Please see elsewhere in the note.     ASSESSMENT AND PLAN:  # Hypertensive heart disease:  Blood pressure is above goal but was better on repeat.  Twice daily for several days per week and bring this to follow-up.  Continue amlodipine, carvedilol, irbesartan, she states that her blood pressure has been in the 110s to 120s at home.  Advised her to log her blood pressure furosemide, and Maxide for now.  Renal function and potassium have been stable despite being on 2 diuretics.  # Chronic  diastolic  heart failure:  # Obesity: # Shortness of breath: # Asthma/COPD: Stable.  Ms. Lopezmartinez has significant swelling.  However it is non-pitting.  BNP has repeatedly been within normal limits.  I do not think that her swelling is from heart failure.  It is more consistent with lymphedema.  She Artie has an appointment with the lymphedema clinic to have her legs wrapped.  I recommended that she wear compression stockings in the meantime.  Given that it is been 3 years and she also has shortness of breath we will repeat her echocardiogram.  I suspect that her shortness of breath is more due to obesity and deconditioning than heart failure.  # Chest pain: Resolved.   She had a negative stress test 05/2015.  # Tobacco abuse: Encouraged smoking cessation with patches as this has been successful in the past.   # Morbid Obesity: Exercise at least 150 minutes weekly.  We will refer her to the [facility] exercise program.  Consider water aerobics.  Current medicines are reviewed at length with the patient today.  The patient does not have concerns regarding medicines.  The following changes have been made:  none  Labs/ tests ordered today include:    Orders Placed This Encounter  Procedures  . ECHOCARDIOGRAM COMPLETE     Disposition:  FU with Dr. Jonelle Sidle C. [place] in 2 months.    Signed, [name], MD  04/06/2018 9:00 AM    [place]

## 2018-04-14 ENCOUNTER — Ambulatory Visit (HOSPITAL_COMMUNITY): Payer: Medicaid Other | Attending: Cardiology

## 2018-04-14 ENCOUNTER — Other Ambulatory Visit: Payer: Self-pay

## 2018-04-14 DIAGNOSIS — R0609 Other forms of dyspnea: Secondary | ICD-10-CM | POA: Insufficient documentation

## 2018-04-14 DIAGNOSIS — I110 Hypertensive heart disease with heart failure: Secondary | ICD-10-CM | POA: Insufficient documentation

## 2018-04-14 DIAGNOSIS — I509 Heart failure, unspecified: Secondary | ICD-10-CM | POA: Diagnosis not present

## 2018-04-14 DIAGNOSIS — M7989 Other specified soft tissue disorders: Secondary | ICD-10-CM | POA: Insufficient documentation

## 2018-04-14 DIAGNOSIS — R0600 Dyspnea, unspecified: Secondary | ICD-10-CM

## 2018-04-18 ENCOUNTER — Ambulatory Visit: Payer: Medicaid Other | Admitting: Cardiovascular Disease

## 2018-05-17 ENCOUNTER — Other Ambulatory Visit: Payer: Self-pay | Admitting: Cardiovascular Disease

## 2018-05-17 MED ORDER — CARVEDILOL 25 MG PO TABS: 25.0000 mg | ORAL_TABLET | Freq: Two times a day (BID) | ORAL | 2 refills | Status: DC

## 2018-05-17 MED ORDER — AMLODIPINE BESYLATE 10 MG PO TABS: 10.0000 mg | ORAL_TABLET | Freq: Every day | ORAL | 2 refills | Status: DC

## 2018-05-17 MED ORDER — IRBESARTAN 300 MG PO TABS: 300.0000 mg | ORAL_TABLET | Freq: Every day | ORAL | 11 refills | Status: DC

## 2018-05-17 MED ORDER — FUROSEMIDE 80 MG PO TABS: 80.0000 mg | ORAL_TABLET | Freq: Two times a day (BID) | ORAL | 2 refills | Status: DC

## 2018-05-17 NOTE — Telephone Encounter (Signed)
°*  STAT* If patient is at the pharmacy, call can be transferred to refill team.   1. Which medications need to be refilled? (please list name of each medication and dose if known) Amlodipine and her other 2 blood pressure medicine(she did not know the names of them) Furosemide  2. Which pharmacy/location (including street and city if local pharmacy) is medication to be sent to? [facility] on [address]  3. Do they need a 30 day or 90 day supply?  52months supply for all of them

## 2018-05-19 ENCOUNTER — Telehealth: Payer: Self-pay | Admitting: Cardiovascular Disease

## 2018-05-19 NOTE — Telephone Encounter (Signed)
New message   Pt c/o medication issue:  1. Name of Medication:irbesartan (AVAPRO) 300 MG tabletirbesartan (AVAPRO) 300 MG tablet  2. How are you currently taking this medication (dosage and times per day)? Once daily in morning  3. Are you having a reaction (difficulty breathing--STAT)? No   4. What is your medication issue? [facility] states that they  need a preauthorization for this medication. [facility] states that they have sent over the authorization for this medication.

## 2018-05-20 ENCOUNTER — Telehealth: Payer: Self-pay | Admitting: Cardiovascular Disease

## 2018-05-20 MED ORDER — VALSARTAN 320 MG PO TABS: 320.0000 mg | ORAL_TABLET | Freq: Every day | ORAL | 5 refills | Status: DC

## 2018-05-20 NOTE — Telephone Encounter (Signed)
Returned call to patient. She needs PA for irbesartan. She has medicaid. She will be out of medicine today. Explained that medicaid does not have an immediate turnaround on prior auth requests  Routed to [facility] LPN

## 2018-05-20 NOTE — Telephone Encounter (Signed)
Spoke with patient and explained PA form has be to faxed and do not want her to do without medication. Patient has taken Diovan (Valsartan) in the past but there was a recall. Patient was given Irbesartan which is not preferred, but Valsartan not available. She denies any issues with Valsartan. Discussed with Raquel Pharm D and ok to change to Valsartan 320 mg daily. Patient agreeable to plan and will call back if any problems.

## 2018-05-20 NOTE — Telephone Encounter (Signed)
New Message:    Patient is calling about an authorization for her medication:   irbesartan (AVAPRO) 300 MG tablet

## 2018-05-21 IMAGING — DX DG CHEST 2V
2 series · 2 of 2 positions shown · non-contrast
Comparison: 02/26/2016

CLINICAL DATA: Left-sided chest pain and shortness of breath.

EXAM:
CHEST  2 VIEW

[w chest pa]
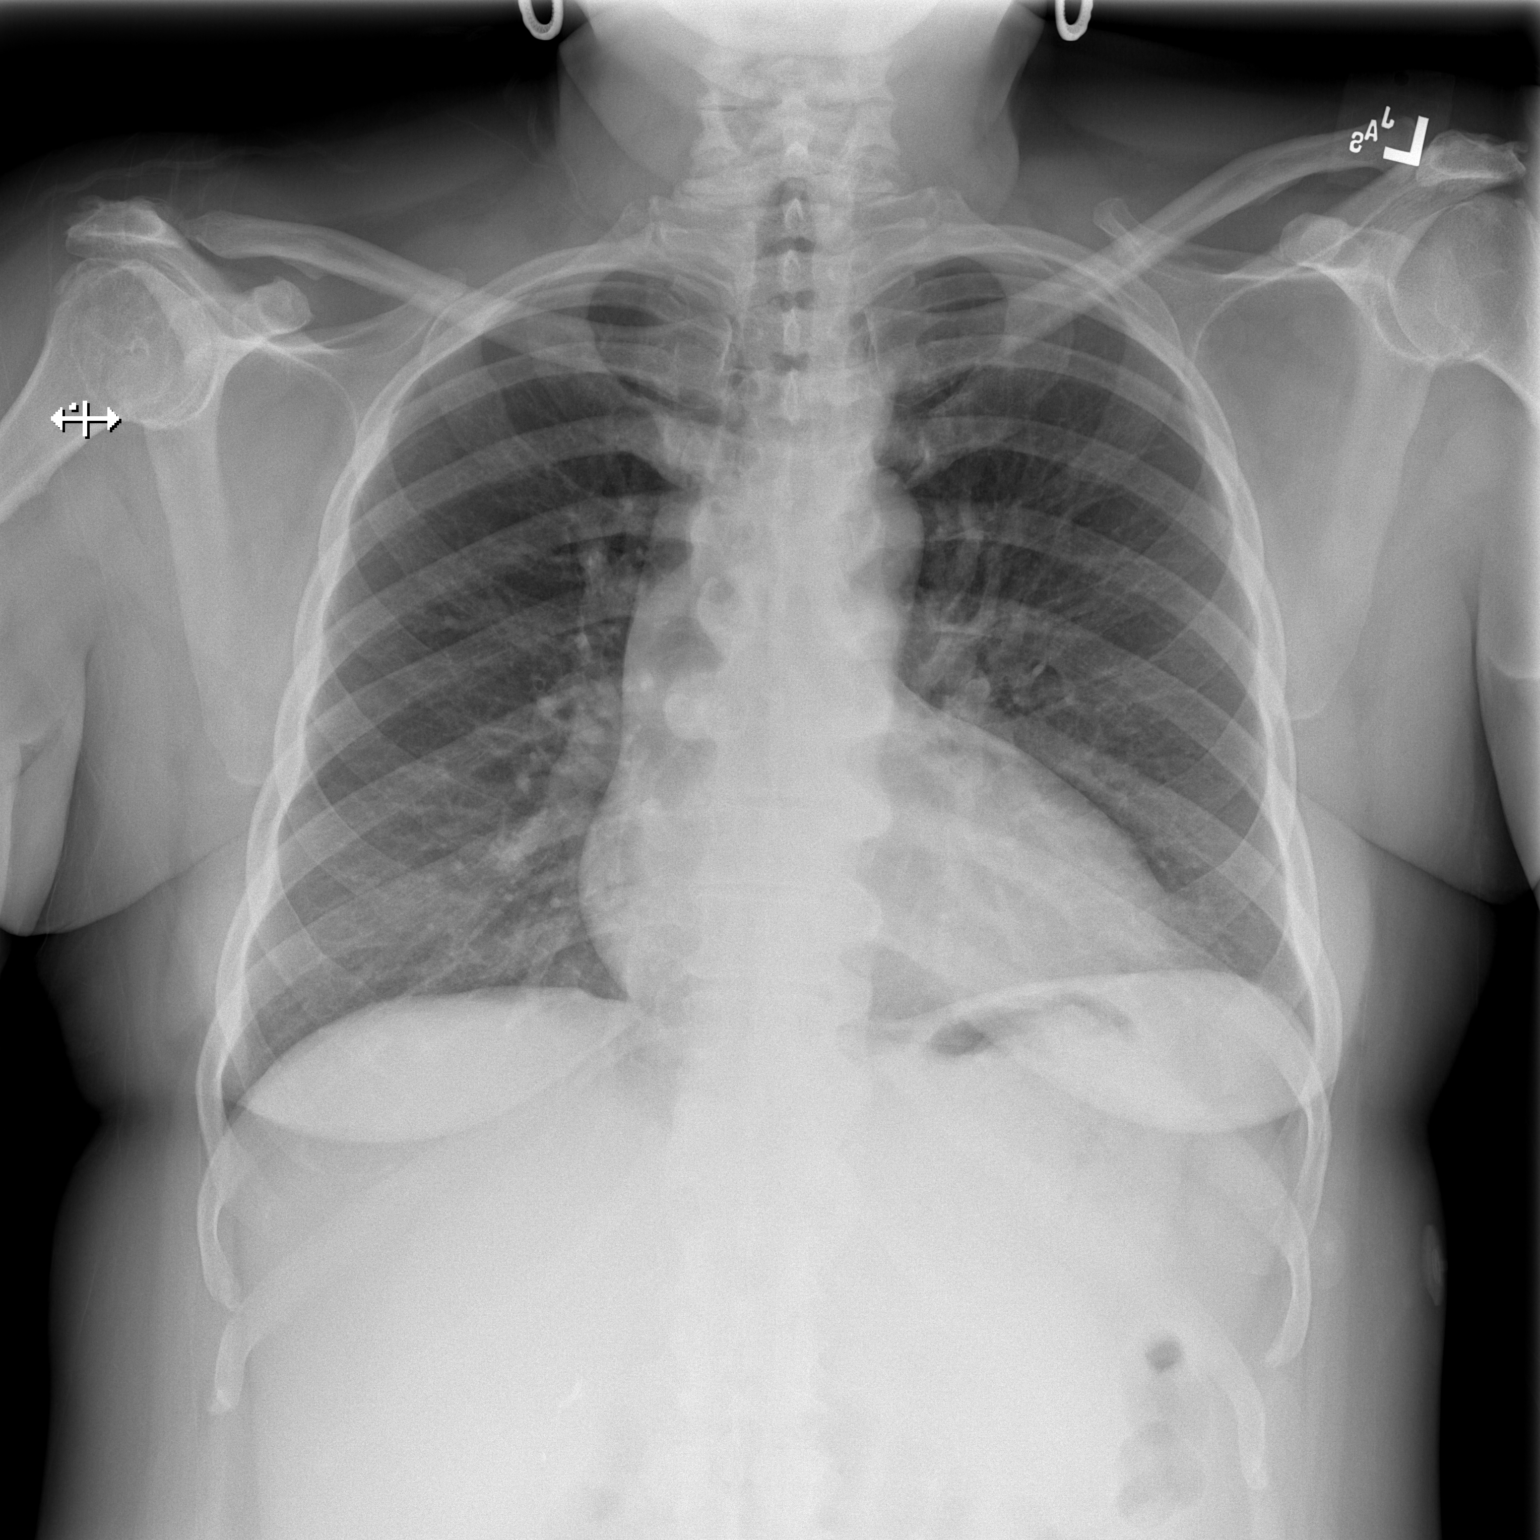

[w chest lat]
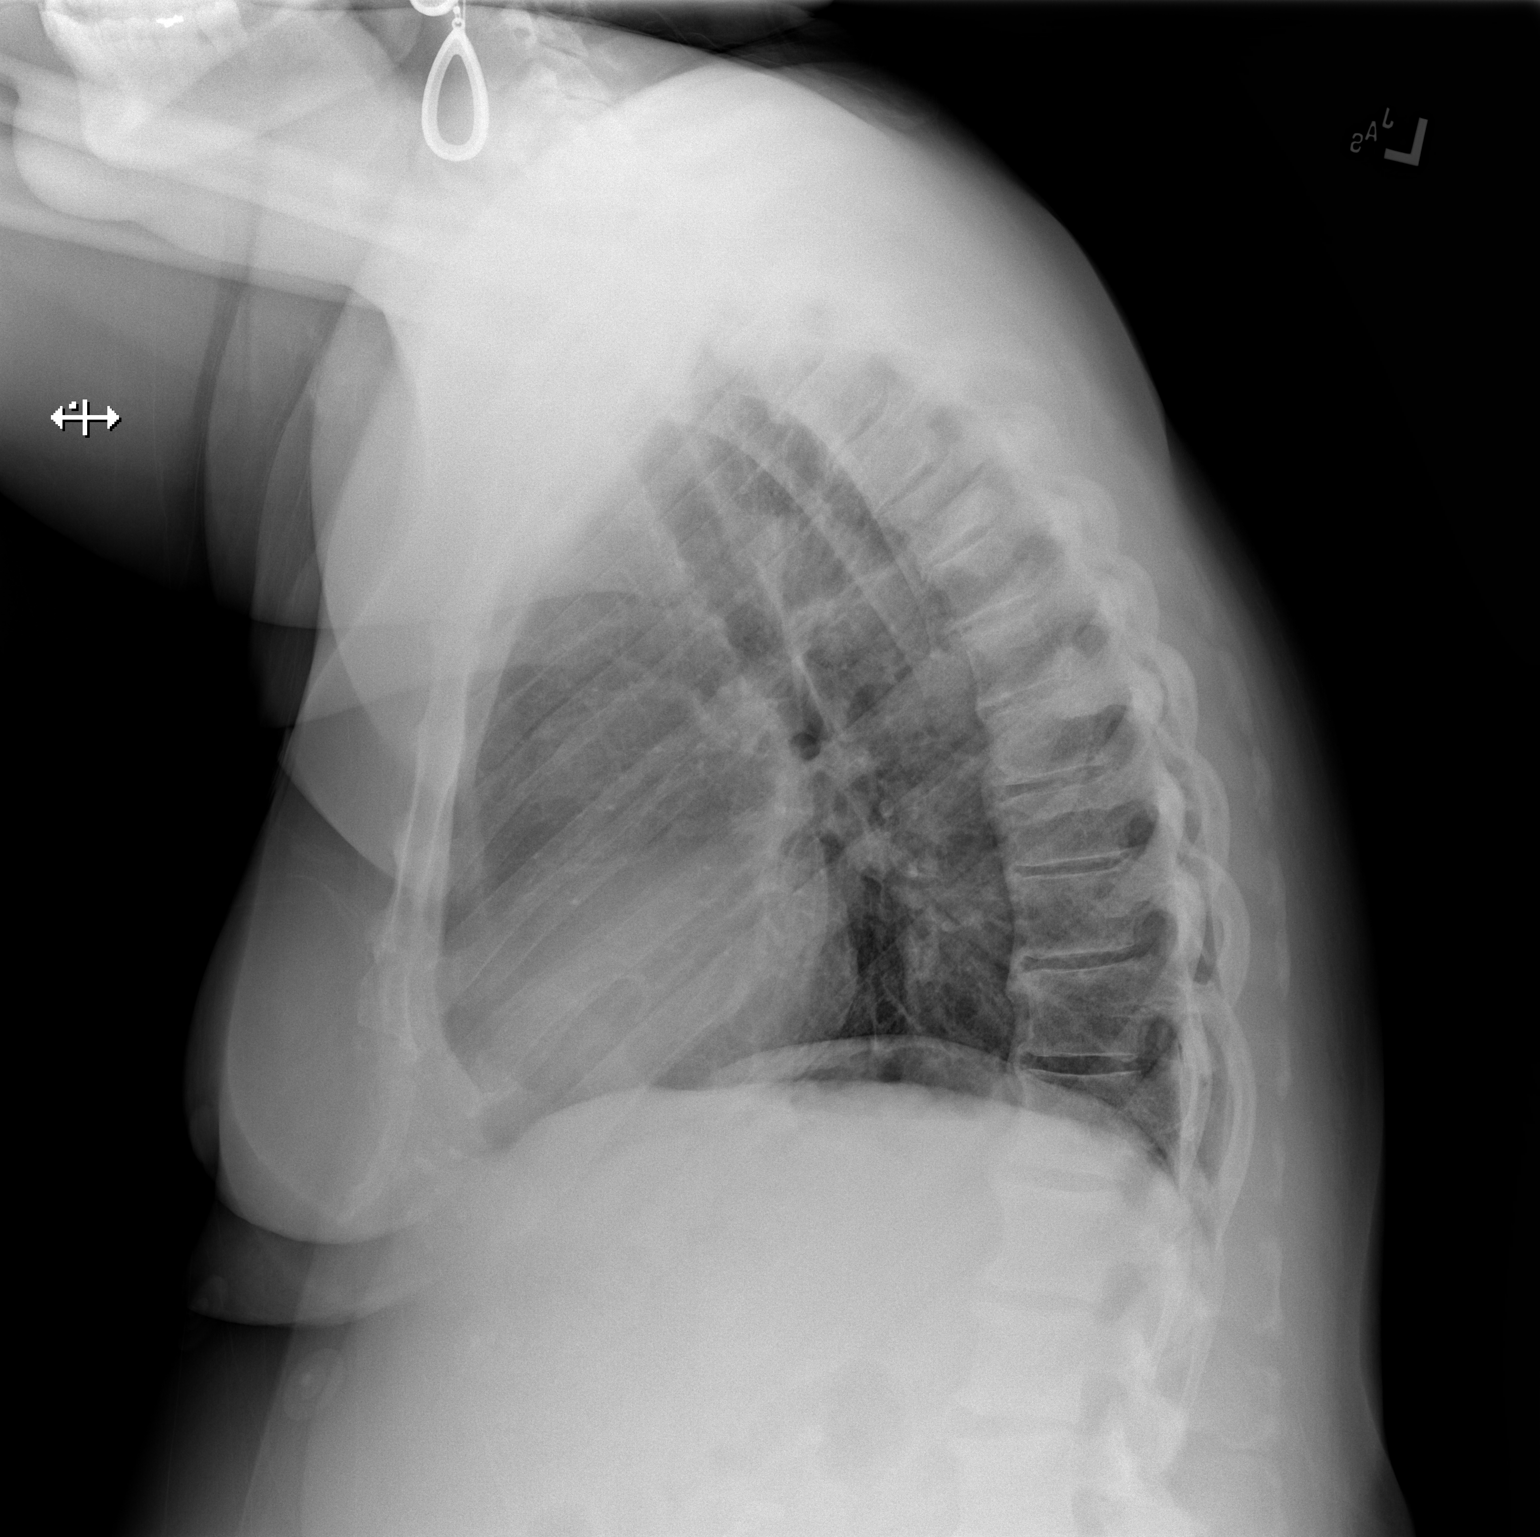

[2 of 2 positions shown; findings below may reference images not displayed]

FINDINGS: There is mild cardiomegaly.  Mediastinal contours appear intact.

There is no evidence of focal airspace consolidation, pleural
effusion or pneumothorax.

Osseous structures are without acute abnormality. Soft tissues are
grossly normal.
IMPRESSION: No active cardiopulmonary disease.

## 2018-05-22 IMAGING — CT CT NECK W/ CM
4 series · 15 of 33 positions shown, 18 images · IV contrast (iopamidol)
Comparison: Cervical spine radiographs April 21, 2015

CLINICAL DATA: Dysphagia.  History of bronchitis.

EXAM:
CT NECK WITH CONTRAST
TECHNIQUE: Multidetector CT imaging of the neck was performed using the
standard protocol following the bolus administration of intravenous
contrast.
CONTRAST:  75mL [id number]-JUU IOPAMIDOL ([id number]-JUU) INJECTION 61%

[Series 3: neck 2.0 st · axial · 0.41mm/px · z∈[+1029,+1159]mm · 5 of 99 slices shown, 7 images (1 of 3)]
[im 17/99  soft-tissue]
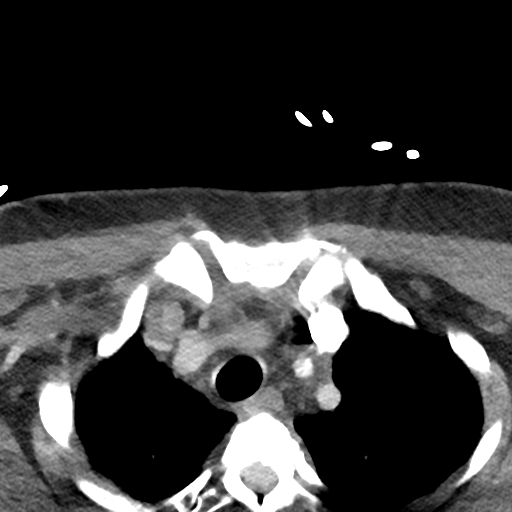
[im 17/99  bone]
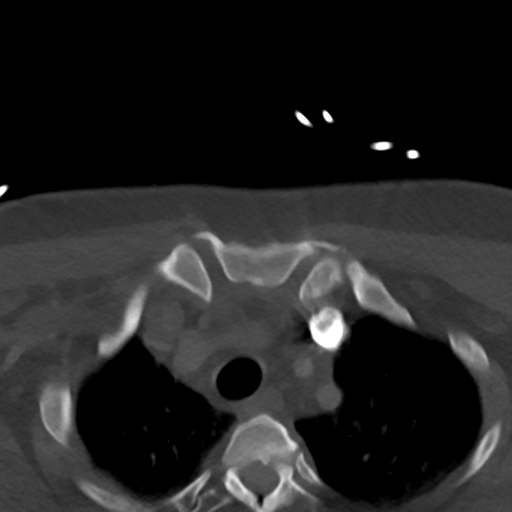
[im 33/99  bone]
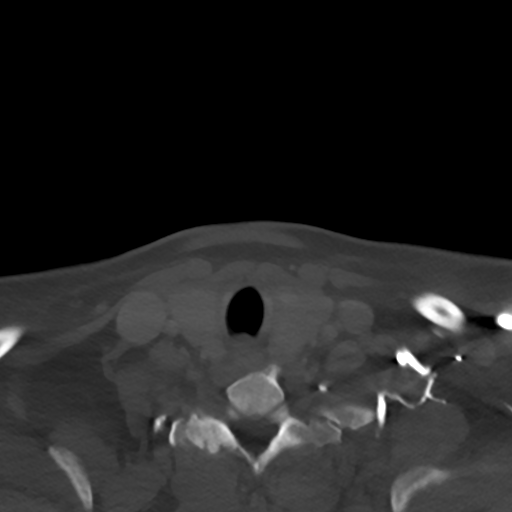
[im 50/99  bone]
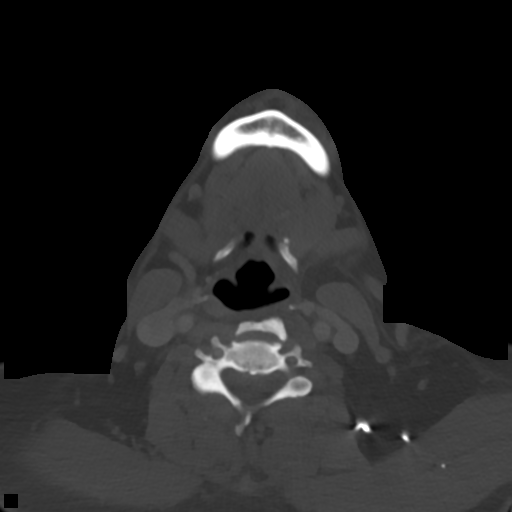
[im 66/99  bone]
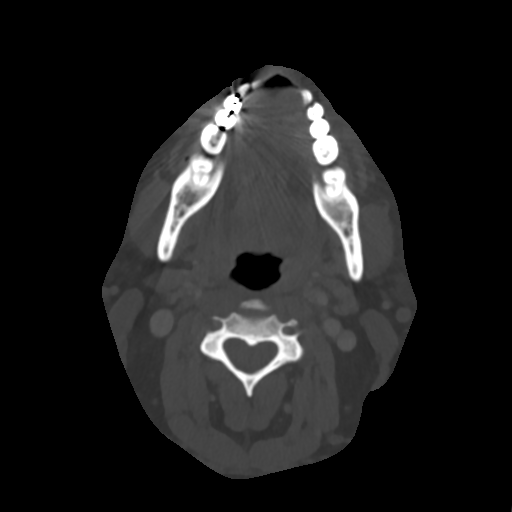
[im 82/99  soft-tissue]
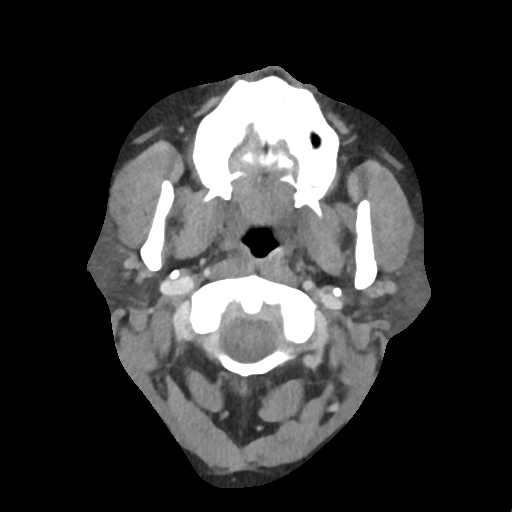
[im 82/99  bone]
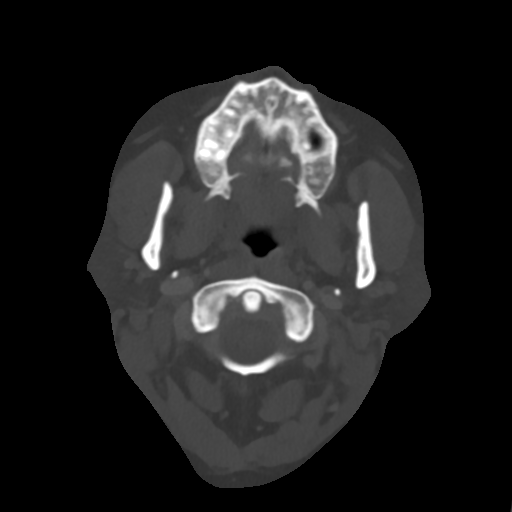

[Series 5: neck 2.0 st · sagittal · 0.43mm/px · 5 of 101 slices shown, 6 images (2 of 3)]
[im 34/101  bone]
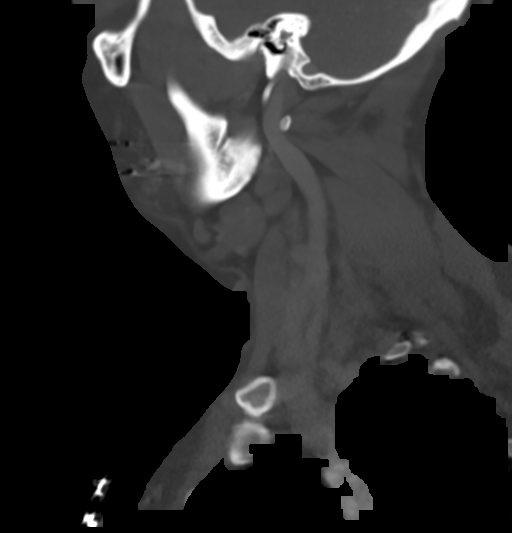
[im 42/101  bone]
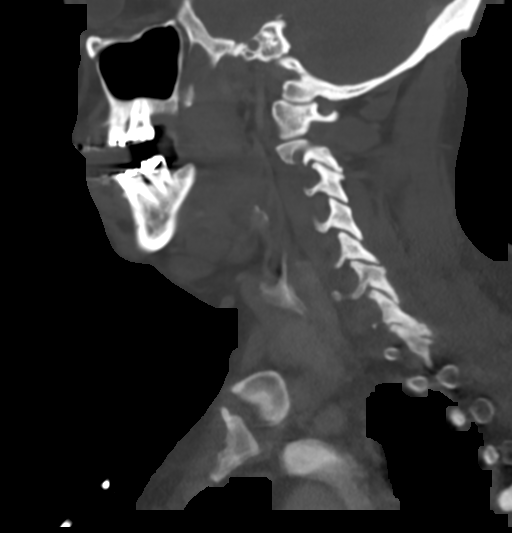
[im 51/101  soft-tissue]
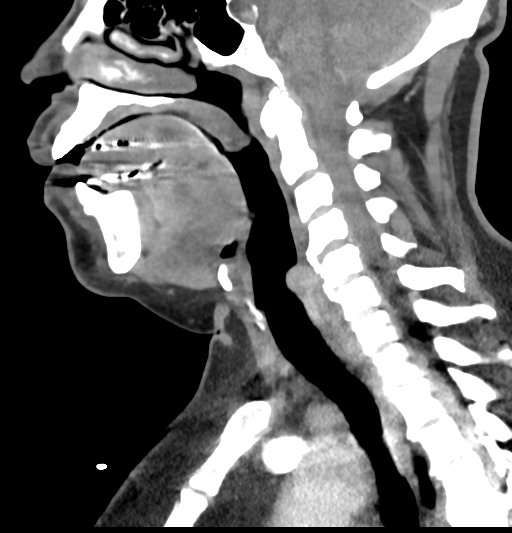
[im 51/101  bone]
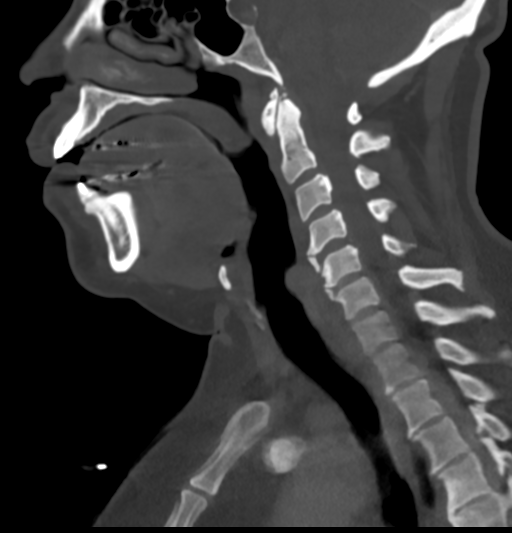
[im 59/101  bone]
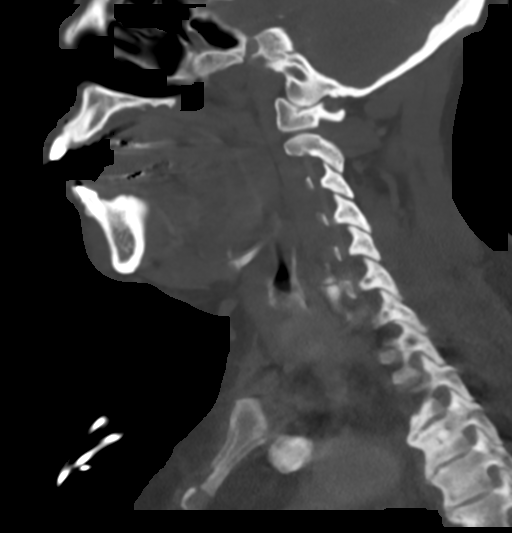
[im 67/101  bone]
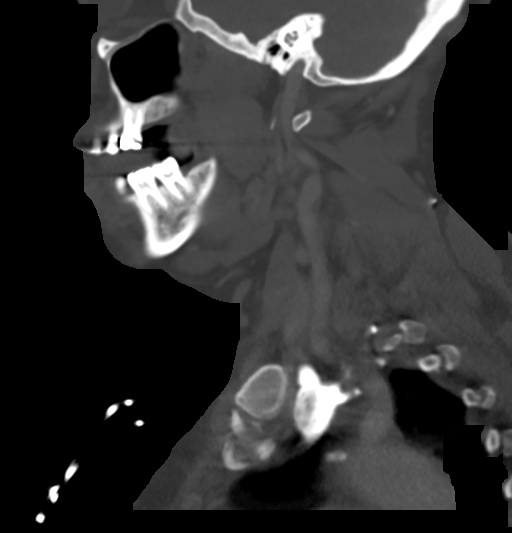

[Series 6: neck 2.0 st · coronal · 0.43mm/px · 3 of 109 slices shown (3 of 3)]
[im 22/109  bone]
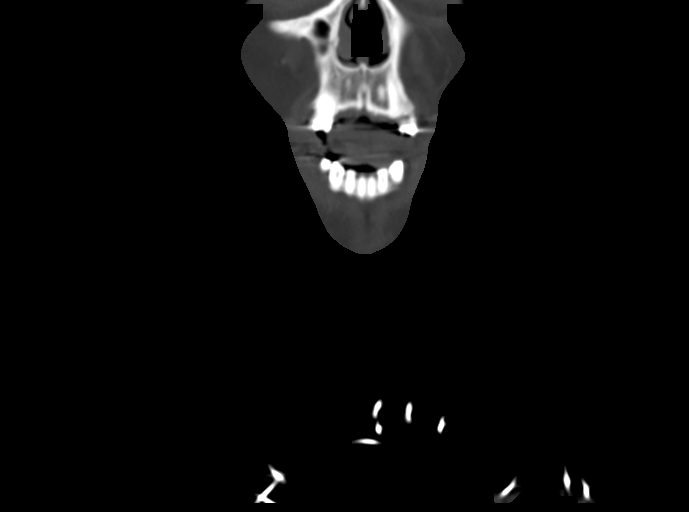
[im 44/109  bone]
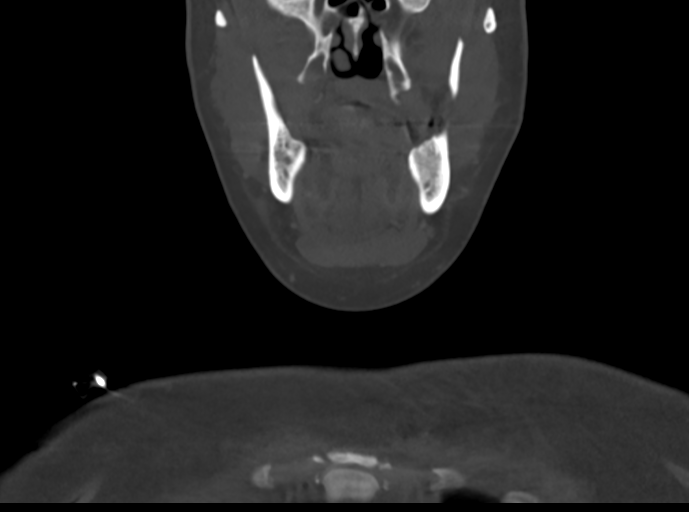
[im 65/109  bone]
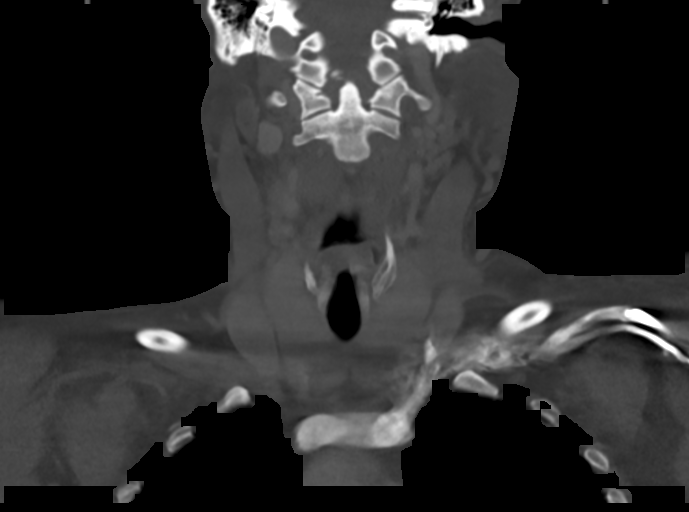

[Series 7: neck 2.0 st orthogonal · axial · 0.39mm/px · z∈[+998,+1034]mm · 2 of 108 slices shown]
[im 18/108  bone]
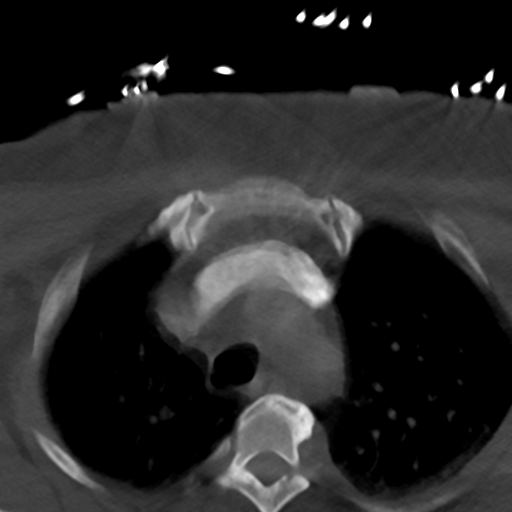
[im 36/108  bone]
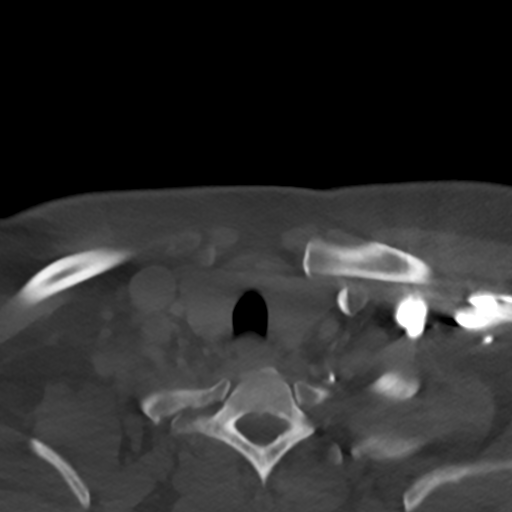

[15 of 33 positions shown; findings below may reference images not displayed]

FINDINGS: Mild motion degraded examination, per technologist's note patient
was snoring.

Pharynx and larynx: Normal, airway is widely patent.

Salivary glands: Normal.

Thyroid: Mild thyromegaly, no discrete nodule.

Lymph nodes: No lymphadenopathy by CT size criteria.

Vascular: Normal.

Limited intracranial: Normal.

Mastoids and visualized paranasal sinuses: Included paranasal
sinuses and mastoid air cells are well aerated.

Orbits:  Old RIGHT orbital floor fracture.

Skeleton: Straightened cervical lordosis without destructive bony
lesions.

Upper chest: Lung apices are clear. No superior mediastinal
lymphadenopathy.
IMPRESSION: No acute process on this motion degraded examination. Patent airway.

Mild thyromegaly.

## 2018-06-07 ENCOUNTER — Encounter: Payer: Self-pay | Admitting: Cardiovascular Disease

## 2018-06-07 ENCOUNTER — Ambulatory Visit (INDEPENDENT_AMBULATORY_CARE_PROVIDER_SITE_OTHER): Payer: Medicaid Other | Admitting: Cardiovascular Disease

## 2018-06-07 VITALS — BP 121/72 | HR 52 | Ht 62.0 in | Wt 247.2 lb

## 2018-06-07 DIAGNOSIS — I890 Lymphedema, not elsewhere classified: Secondary | ICD-10-CM

## 2018-06-07 DIAGNOSIS — I5032 Chronic diastolic (congestive) heart failure: Secondary | ICD-10-CM

## 2018-06-07 DIAGNOSIS — Z5181 Encounter for therapeutic drug level monitoring: Secondary | ICD-10-CM

## 2018-06-07 DIAGNOSIS — I10 Essential (primary) hypertension: Secondary | ICD-10-CM | POA: Diagnosis not present

## 2018-06-07 HISTORY — DX: Lymphedema, not elsewhere classified: I89.0

## 2018-06-07 LAB — LIPID PANEL
Chol/HDL Ratio: 2.9 ratio (ref 0.0–4.4)
Cholesterol, Total: 148 mg/dL (ref 100–199)
HDL: 51 mg/dL (ref >39)
LDL Calculated: 76 mg/dL (ref 0–99)
Triglycerides: 106 mg/dL (ref 0–149)
VLDL Cholesterol Cal: 21 mg/dL (ref 5–40)

## 2018-06-07 LAB — HEPATIC FUNCTION PANEL
ALT: 17 IU/L (ref 0–32)
AST: 6 IU/L (ref 0–40)
Albumin: 3.8 g/dL (ref 3.5–5.5)
Alkaline Phosphatase: 95 IU/L (ref 39–117)
Bilirubin Total: 0.3 mg/dL (ref 0.0–1.2)
Bilirubin, Direct: 0.12 mg/dL (ref 0.00–0.40)
Total Protein: 6.3 g/dL (ref 6.0–8.5)

## 2018-06-07 NOTE — Progress Notes (Signed)
Cardiology Office Note   Date:  06/07/2018   ID:  RAMA SORCI, DOB July 31, 1971, MRN [id number]  PCP:  [name], PA  Cardiologist:   [name], MD   Chief Complaint  Patient presents with  . Shortness of Breath    frequently  . Dizziness    when getting up too fast.   . Leg Pain    cramping in legs.     Patient ID: Lori Bean is a 47 y.o. female with chronic diastolic heart failure, hypertensive heart disease, OSA on CPAP, lymphedema, schizophrenia, bipolar disorder, obesity and prior PE who presents for follow up.  Lori Bean was initially seen 05/2015 with fatigue and shortness of breath.  At the time she endorsed heart failure symptoms, including lower extremity edema, orthopnea and shortness of breath.  She also reported atypical chest pain.  After that appointment she was referred to the hospital and had a [place] 05/26/15 that was negative for ischemia and revealed LVEF 57%.  She had an echo 05/24/15 with LVEF 60-65% and grade 2 diastolic dysfunction.    Since her last appointment Lori Bean has been doing well.  She was diagnosed with lymphedema and has been going to the clinic.  She was fitted for a pump to help with her swelling and is waiting to receive this in the mail.  She was also referred for water aerobics class.  However she will not be able to start this until her insurance changes in February.  Her exercise is limited by chronic back and knee pain.  She needs her right knee replaced but cannot do this until she loses weight and gets to 215 pounds.  She has been working on her diet.  She avoids fried and fatty foods.  She does not eat after 6 PM.  If she gets hungry after that she either drinks water or has fruit.  She has also cut bread out of her diet.  She is been able to lose 8 pounds in the last 2 months.  Her blood pressure has been well-controlled at home.  She is trying to cut back smoking and is not smoking 1/2 pack  of cigarettes daily.   Past Medical History:  Diagnosis Date  . Anginal pain ([place])    occ from asthma  . Anxiety   . Arthritis   . Asthma   . Bipolar affective disorder ([place])    Schizophrenia  . Bronchitis    hx of  . CHF (congestive heart failure) ([place])   . Depression   . GERD (gastroesophageal reflux disease)   . Heart murmur   . Hypertension    takes meds  . Lymphedema 06/07/2018  . Morbid (severe) obesity due to excess calories ([place])   . Neuropathy    left leg , "from back surgery"  . Overactive bladder   . Schizophrenia ([place])   . Sciatica   . Shortness of breath   . Sleep apnea   . Snoring disorder    Pt stated my boyfriend always wakes me up and tells me to breathe    Past Surgical History:  Procedure Laterality Date  . BACK SURGERY     3 back surgeries  . CHOLECYSTECTOMY    . COLONOSCOPY WITH PROPOFOL N/A 01/23/2016   Procedure: COLONOSCOPY WITH PROPOFOL;  Surgeon: Wonda Horner, MD;  Location: [facility];  Service: Endoscopy;  Laterality: N/A;  . EYE SURGERY     Metal plate in right  eye. Had fracture in right eye  . gallstones reomved    . KNEE ARTHROSCOPY WITH MENISCAL REPAIR Right 09/28/2016   Procedure: KNEE ARTHROSCOPY WITH MENISCAL REPAIR;  Surgeon: Dorna Leitz, MD;  Location: [facility];  Service: Orthopedics;  Laterality: Right;  Right partial meniscectomy and chondroplasty, patellar/femoral joint and medial femoral condyle   . LUMBAR LAMINECTOMY/DECOMPRESSION MICRODISCECTOMY  04/01/2012   Procedure: LUMBAR LAMINECTOMY/DECOMPRESSION MICRODISCECTOMY 2 LEVELS;  Surgeon: Faythe Ghee, MD;  Location: [facility];  Service: Neurosurgery;  Laterality: Left;  Lumbar four-five, lumbar five sacral one microdiscectomy   . LUMBAR WOUND DEBRIDEMENT  04/29/2012   Procedure: LUMBAR WOUND DEBRIDEMENT;  Surgeon: Faythe Ghee, MD;  Location: [facility];  Service: Neurosurgery;  Laterality: N/A;  lumbar wound debridement  . ROTATOR CUFF REPAIR     Right shoulder  .  TUBAL LIGATION       Current Outpatient Medications  Medication Sig Dispense Refill  . albuterol (PROAIR HFA) 108 (90 Base) MCG/ACT inhaler Inhale 2 puffs into the lungs every 4 (four) hours as needed for wheezing or shortness of breath.    [name] albuterol (PROVENTIL) (2.5 MG/3ML) 0.083% nebulizer solution Take 2.5 mg by nebulization every 4 (four) hours as needed for wheezing or shortness of breath.    [name] amitriptyline (ELAVIL) 25 MG tablet Take 25 mg by mouth at bedtime.    [name] amLODipine (NORVASC) 10 MG tablet Take 1 tablet (10 mg total) by mouth daily. 90 tablet 2  . ARIPiprazole (ABILIFY) 20 MG tablet Take 1 tablet (20 mg total) by mouth at bedtime. (Patient taking differently: Take 20 mg by mouth 2 (two) times daily. ) 30 tablet 0  . benzonatate (TESSALON) 100 MG capsule Take 1 capsule (100 mg total) by mouth 3 (three) times daily as needed for cough. 21 capsule 0  . budesonide-formoterol (SYMBICORT) 160-4.5 MCG/ACT inhaler Inhale 2 puffs into the lungs 2 (two) times daily. 1 Inhaler 5  . carvedilol (COREG) 25 MG tablet Take 1 tablet (25 mg total) by mouth 2 (two) times daily. 180 tablet 2  . DULoxetine (CYMBALTA) 30 MG capsule Take 30 mg by mouth 2 (two) times daily.    . famotidine (PEPCID) 20 MG tablet One at bedtime 30 tablet 11  . furosemide (LASIX) 80 MG tablet Take 1 tablet (80 mg total) by mouth 2 (two) times daily. 180 tablet 2  . LYRICA 150 MG capsule Take 150 mg by mouth 2 (two) times daily.  2  . meloxicam (MOBIC) 15 MG tablet Take 15 mg by mouth daily as needed for pain.  0  . omeprazole (PRILOSEC) 40 MG capsule Take 1 capsule (40 mg total) by mouth daily. 90 capsule 0  . phentermine (ADIPEX-P) 37.5 MG tablet Take 37.5 mg by mouth daily.  1  . potassium chloride SA (K-DUR,KLOR-CON) 20 MEQ tablet Take 20 mEq by mouth daily.    [name] tiZANidine (ZANAFLEX) 4 MG tablet Take 4 mg by mouth every 8 (eight) hours as needed for spasms.  1  . traMADol (ULTRAM) 50 MG tablet Take 50 mg by mouth  every 8 (eight) hours as needed for pain.  0  . triamterene-hydrochlorothiazide (MAXZIDE-25) 37.5-25 MG tablet Take 1 tablet by mouth daily.  5  . valsartan (DIOVAN) 320 MG tablet Take 1 tablet (320 mg total) by mouth daily. 30 tablet 5  . budesonide-formoterol (SYMBICORT) 160-4.5 MCG/ACT inhaler Inhale 2 puffs into the lungs 2 (two) times daily for 1 day. 1 Inhaler 0  No current facility-administered medications for this visit.     Allergies:   Aspirin    Social History:  The patient  reports that she has been smoking cigarettes. She has a 12.00 pack-year smoking history. She has never used smokeless tobacco. She reports that she does not drink alcohol or use drugs.   Family History:  The patient's family history includes Cancer in her paternal aunt; Diabetes in her father and mother; Heart disease in her paternal aunt; Hypertension in her mother.    ROS:  Please see the history of present illness.   Otherwise, review of systems are positive for none.   All other systems are reviewed and negative.    PHYSICAL EXAM: VS:  BP 121/72   Pulse (!) 52   Ht 5\' 2"  (1.575 m)   Wt 247 lb 3.2 oz (112.1 kg)   BMI 45.21 kg/m  , BMI Body mass index is 45.21 kg/m. GENERAL:  Well appearing HEENT: Pupils equal round and reactive, fundi not visualized, oral mucosa unremarkable NECK:  No jugular venous distention, waveform within normal limits, carotid upstroke brisk and symmetric, no bruits LYMPHATICS: UE and LE lymphedema LUNGS:  Clear to auscultation bilaterally HEART:  RRR.  PMI not displaced or sustained,S1 and S2 within normal limits, no S3, no S4, no clicks, no rubs, no murmurs ABD:  Flat, positive bowel sounds normal in frequency in pitch, no bruits, no rebound, no guarding, no midline pulsatile mass, no hepatomegaly, no splenomegaly EXT:  2 plus pulses throughout, bilateral LU/UE non-pitting edema, no cyanosis no clubbing SKIN:  No rashes no nodules NEURO:  Cranial nerves II through XII  grossly intact, motor grossly intact throughout PSYCH:  Cognitively intact, oriented to person place and time  EKG:  EKG is not ordered today. The ekg ordered 09/18/16 demonstrates sinus rhythm. Rate 77 bpm.  Non-specific T wave changes.    Echo 05/24/15: Study Conclusions  - Left ventricle: The cavity size was normal. Wall thickness was  increased in a pattern of mild LVH. Systolic function was normal.  The estimated ejection fraction was in the range of 60% to 65%.  Wall motion was normal; there were no regional wall motion  abnormalities. Features are consistent with a pseudonormal left  ventricular filling pattern, with concomitant abnormal relaxation  and increased filling pressure (grade 2 diastolic dysfunction).  Lexiscan Myoview 05/26/15: IMPRESSION: 1. No reversible ischemia or infarction.  2. Mild septal hypokinesis.  3. Left ventricular ejection fraction 57%  4. Low-risk stress test findings*.   Recent Labs: 07/07/2017: ALT 28 10/28/2017: Pro B Natriuretic peptide (BNP) 14.0; TSH 0.72 03/22/2018: B Natriuretic Peptide 29.7; BUN 9; Creatinine, Ser 1.12; Hemoglobin 13.6; Platelets 253; Potassium 3.7; Sodium 140    Lipid Panel    Component Value Date/Time   CHOL 133 05/25/2015 0333   TRIG 105 05/25/2015 0333   HDL 50 05/25/2015 0333   CHOLHDL 2.7 05/25/2015 0333   VLDL 21 05/25/2015 0333   LDLCALC 62 05/25/2015 0333      Wt Readings from Last 3 Encounters:  06/07/18 247 lb 3.2 oz (112.1 kg)  04/06/18 255 lb 6.4 oz (115.8 kg)  04/01/18 252 lb 6.4 oz (114.5 kg)     Other studies Reviewed: Additional studies/ records that were reviewed today include: . Review of the above records demonstrates:  Please see elsewhere in the note.     ASSESSMENT AND PLAN:  # Hypertensive heart disease:  Blood pressure is much better controlled.  Continue amlodipine, Lasix, HCTZ/triamterene,  losartan, and carvedilol.  # Chronic diastolic heart failure:  # Obesity: #  Shortness of breath: # Asthma/COPD: Stable.  Lori Bean has significant swelling.  However it is non-pitting.  BNP has repeatedly been within normal limits.  Her swelling is attributable to lymphedema and she is awaiting compression devices.    # Chest pain: Resolved.   She had a negative stress test 05/2015.  # Tobacco abuse: Encouraged continued smoking cessation.  # Morbid Obesity: Exercise at least 150 minutes weekly.  She will start an exercise program once her insurance allows.  # CV Disease Prevention: Check fasting lipids and LFTs.   Current medicines are reviewed at length with the patient today.  The patient does not have concerns regarding medicines.  The following changes have been made:  none  Labs/ tests ordered today include:    Orders Placed This Encounter  Procedures  . Lipid panel  . Hepatic function panel     Disposition:  FU with Dr. Jonelle Sidle C. Oval Linsey in 1 year.  APP in 6 months.    Signed, [name], MD  06/07/2018 10:24 AM    [place]

## 2018-06-07 NOTE — Patient Instructions (Addendum)
Medication Instructions:  Your physician recommends that you continue on your current medications as directed. Please refer to the Current Medication list given to you today.  Labwork: LP/LFT'S SOON   Testing/Procedures: NONE  Follow-Up: Your physician wants you to follow-up in: [place] [facility] [place]  You will receive a reminder letter in the mail two months in advance. If you don't receive a letter, please call our office to schedule the follow-up appointment.  Your physician wants you to follow-up in: [place] will receive a reminder letter in the mail two months in advance. If you don't receive a letter, please call our office to schedule the follow-up appointment.  If you need a refill on your cardiac medications before your next appointment, please call your pharmacy.

## 2018-07-01 ENCOUNTER — Emergency Department (HOSPITAL_COMMUNITY)
Admission: EM | Admit: 2018-07-01 | Discharge: 2018-07-01 | Disposition: A | Discharge status: Home / Self Care | Payer: Medicaid Other | Attending: Emergency Medicine | Admitting: Emergency Medicine

## 2018-07-01 ENCOUNTER — Emergency Department (HOSPITAL_COMMUNITY): Payer: Medicaid Other

## 2018-07-01 ENCOUNTER — Encounter (HOSPITAL_COMMUNITY): Payer: Self-pay

## 2018-07-01 DIAGNOSIS — F17210 Nicotine dependence, cigarettes, uncomplicated: Secondary | ICD-10-CM | POA: Insufficient documentation

## 2018-07-01 DIAGNOSIS — Z79899 Other long term (current) drug therapy: Secondary | ICD-10-CM | POA: Insufficient documentation

## 2018-07-01 DIAGNOSIS — I110 Hypertensive heart disease with heart failure: Secondary | ICD-10-CM | POA: Insufficient documentation

## 2018-07-01 DIAGNOSIS — I5032 Chronic diastolic (congestive) heart failure: Secondary | ICD-10-CM | POA: Insufficient documentation

## 2018-07-01 DIAGNOSIS — J4530 Mild persistent asthma, uncomplicated: Secondary | ICD-10-CM | POA: Insufficient documentation

## 2018-07-01 DIAGNOSIS — R0602 Shortness of breath: Secondary | ICD-10-CM | POA: Diagnosis not present

## 2018-07-01 LAB — BASIC METABOLIC PANEL
Anion gap: 6 (ref 5–15)
BUN: 14 mg/dL (ref 6–20)
CO2: 24 mmol/L (ref 22–32)
Calcium: 9.2 mg/dL (ref 8.9–10.3)
Chloride: 107 mmol/L (ref 98–111)
Creatinine, Ser: 0.73 mg/dL (ref 0.44–1.00)
GFR calc Af Amer: >60 mL/min (ref >60)
GFR calc non Af Amer: >60 mL/min (ref >60)
Glucose, Bld: 113 mg/dL — ABNORMAL HIGH (ref 70–99)
Potassium: 4.2 mmol/L (ref 3.5–5.1)
Sodium: 137 mmol/L (ref 135–145)

## 2018-07-01 LAB — CBC WITH DIFFERENTIAL/PLATELET
Abs Immature Granulocytes: 0.05 10*3/uL (ref 0.00–0.07)
Basophils Absolute: 0 10*3/uL (ref 0.0–0.1)
Basophils Relative: 0 %
Eosinophils Absolute: 0.3 10*3/uL (ref 0.0–0.5)
Eosinophils Relative: 3 %
HCT: 45.5 % (ref 36.0–46.0)
Hemoglobin: 14.5 g/dL (ref 12.0–15.0)
Immature Granulocytes: 1 %
Lymphocytes Relative: 27 %
Lymphs Abs: 2.7 10*3/uL (ref 0.7–4.0)
MCH: 30.2 pg (ref 26.0–34.0)
MCHC: 31.9 g/dL (ref 30.0–36.0)
MCV: 94.8 fL (ref 80.0–100.0)
Monocytes Absolute: 1 10*3/uL (ref 0.1–1.0)
Monocytes Relative: 10 %
Neutro Abs: 6.2 10*3/uL (ref 1.7–7.7)
Neutrophils Relative %: 59 %
Platelets: 240 10*3/uL (ref 150–400)
RBC: 4.8 MIL/uL (ref 3.87–5.11)
RDW: 16 % — ABNORMAL HIGH (ref 11.5–15.5)
WBC: 10.3 10*3/uL (ref 4.0–10.5)
nRBC: 0 % (ref 0.0–0.2)

## 2018-07-01 LAB — BRAIN NATRIURETIC PEPTIDE: B Natriuretic Peptide: 16.5 pg/mL (ref 0.0–100.0)

## 2018-07-01 LAB — I-STAT TROPONIN, ED: Troponin i, poc: 0 ng/mL (ref 0.00–0.08)

## 2018-07-01 MED ORDER — FUROSEMIDE 10 MG/ML IJ SOLN
80.0000 mg | Freq: Once | INTRAMUSCULAR | Status: AC
  Administered 2018-07-01: 80 mg via INTRAVENOUS
  Filled 2018-07-01: qty 8

## 2018-07-01 NOTE — ED Notes (Signed)
Pt ambulated with pulse ox was at 97% steady gait.

## 2018-07-01 NOTE — ED Provider Notes (Signed)
[facility] EMERGENCY DEPARTMENT Provider Note   CSN: [id number] Arrival date & time: 07/01/18  0815     History   Chief Complaint Chief Complaint  Patient presents with  . Shortness of Breath    HPI Lori Bean is a 47 y.o. female.  The history is provided by the patient and medical records. No language interpreter was used.  Shortness of Breath      47 year old female with history of asthma, CHF, bipolar, schizophrenia, anxiety presenting for evaluation of shortness of breath.  Patient report gradual onset of progressive worsening shortness of breath ongoing for the past 5 days.  She describes shortness of breath as "I feel like I am going to drown whenever I lay down".  Symptoms worsen when she lays down.  She also noticed increased fluid gain throughout her body but she does not weigh herself on a regular basis.  She does endorse some subjective fever and chills, productive cough, and some diarrhea.  She mentioned her symptoms felt similar to prior CHF exacerbation.  She has been compliant with her furosemide and without adequate relief.  She denies any significant chest pain or back pain.  Denies any hemoptysis.  Past Medical History:  Diagnosis Date  . Anginal pain ([place])    occ from asthma  . Anxiety   . Arthritis   . Asthma   . Bipolar affective disorder ([place])    Schizophrenia  . Bronchitis    hx of  . CHF (congestive heart failure) ([place])   . Depression   . GERD (gastroesophageal reflux disease)   . Heart murmur   . Hypertension    takes meds  . Lymphedema 06/07/2018  . Morbid (severe) obesity due to excess calories ([place])   . Neuropathy    left leg , "from back surgery"  . Overactive bladder   . Schizophrenia ([place])   . Sciatica   . Shortness of breath   . Sleep apnea   . Snoring disorder    Pt stated my boyfriend always wakes me up and tells me to breathe    Patient Active Problem List   Diagnosis Date Noted  . Lymphedema  06/07/2018  . Abnormal uterine bleeding (AUB) 12/12/2017  . Heart murmur   . CHF (congestive heart failure) ([place])   . Chronic asthma, mild persistent, uncomplicated 49/70/2637  . Dyspnea on exertion 05/05/2017  . Asthma exacerbation in COPD ([place]) 05/05/2017  . Morbid obesity due to excess calories ([place]) 05/05/2017  . Asthma exacerbation 12/17/2016  . 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 Synovial plica of right knee 85/88/5027  . Tear of medial meniscus of right knee, initial encounter 09/28/2016  . OSA (obstructive sleep apnea) 09/23/2016  . Chronic diastolic heart failure ([place]) 04/22/2016  . Prediabetes 04/22/2016  . Cigarette smoker 04/03/2014  . Obesity (BMI 30-39.9) 04/03/2014  . Chest pain 04/03/2014  . Mild cardiomegaly 02/21/2014  . Lumbar spondylosis 12/28/2013  . Extrinsic asthma 06/07/2013  . Anxiety   . Essential hypertension   . Bipolar affective disorder ([place])   . GERD (gastroesophageal reflux disease)   . Arthritis   . Schizophrenia ([place])   . Overactive bladder     Past Surgical History:  Procedure Laterality Date  . BACK SURGERY     3 back surgeries  . CHOLECYSTECTOMY    . COLONOSCOPY WITH PROPOFOL N/A 01/23/2016   Procedure: COLONOSCOPY WITH PROPOFOL;  Surgeon: Wonda Horner, MD;  Location: [facility];  Service: Endoscopy;  Laterality:  N/A;  . EYE SURGERY     Metal plate in right eye. Had fracture in right eye  . gallstones reomved    . KNEE ARTHROSCOPY WITH MENISCAL REPAIR Right 09/28/2016   Procedure: KNEE ARTHROSCOPY WITH MENISCAL REPAIR;  Surgeon: Dorna Leitz, MD;  Location: [facility];  Service: Orthopedics;  Laterality: Right;  Right partial meniscectomy and chondroplasty, patellar/femoral joint and medial femoral condyle   . LUMBAR LAMINECTOMY/DECOMPRESSION MICRODISCECTOMY  04/01/2012   Procedure: LUMBAR LAMINECTOMY/DECOMPRESSION MICRODISCECTOMY 2 LEVELS;  Surgeon: Faythe Ghee, MD;  Location: [facility];  Service: Neurosurgery;  Laterality: Left;   Lumbar four-five, lumbar five sacral one microdiscectomy   . LUMBAR WOUND DEBRIDEMENT  04/29/2012   Procedure: LUMBAR WOUND DEBRIDEMENT;  Surgeon: Faythe Ghee, MD;  Location: [facility];  Service: Neurosurgery;  Laterality: N/A;  lumbar wound debridement  . ROTATOR CUFF REPAIR     Right shoulder  . TUBAL LIGATION       OB History    Gravida  4   Para  2   Term  0   Preterm  2   AB  2   Living  2     SAB  1   TAB  1   Ectopic      Multiple      Live Births  2            Home Medications    Prior to Admission medications   Medication Sig Start Date End Date Taking? Authorizing Provider  albuterol (PROAIR HFA) 108 (90 Base) MCG/ACT inhaler Inhale 2 puffs into the lungs every 4 (four) hours as needed for wheezing or shortness of breath.    [provider]  albuterol (PROVENTIL) (2.5 MG/3ML) 0.083% nebulizer solution Take 2.5 mg by nebulization every 4 (four) hours as needed for wheezing or shortness of breath.    [provider]  amitriptyline (ELAVIL) 25 MG tablet Take 25 mg by mouth at bedtime.    [provider]  amLODipine (NORVASC) 10 MG tablet Take 1 tablet (10 mg total) by mouth daily. 05/17/18 08/15/18  [name], MD  ARIPiprazole (ABILIFY) 20 MG tablet Take 1 tablet (20 mg total) by mouth at bedtime. Patient taking differently: Take 20 mg by mouth 2 (two) times daily.  03/04/15   Lance Bosch, NP  benzonatate (TESSALON) 100 MG capsule Take 1 capsule (100 mg total) by mouth 3 (three) times daily as needed for cough. 03/22/18   Duffy Bruce, MD  budesonide-formoterol [facility]) 160-4.5 MCG/ACT inhaler Inhale 2 puffs into the lungs 2 (two) times daily. 04/01/18   Parrett, Fonnie Mu, NP  budesonide-formoterol (SYMBICORT) 160-4.5 MCG/ACT inhaler Inhale 2 puffs into the lungs 2 (two) times daily for 1 day. 04/01/18 04/06/18  Parrett, Fonnie Mu, NP  carvedilol (COREG) 25 MG tablet Take 1 tablet (25 mg total) by mouth 2 (two) times  daily. 05/17/18 08/15/18  [name], MD  DULoxetine (CYMBALTA) 30 MG capsule Take 30 mg by mouth 2 (two) times daily.    [provider]  famotidine (PEPCID) 20 MG tablet One at bedtime 06/04/17   Tanda Rockers, MD  furosemide (LASIX) 80 MG tablet Take 1 tablet (80 mg total) by mouth 2 (two) times daily. 05/17/18   [name], MD  LYRICA 150 MG capsule Take 150 mg by mouth 2 (two) times daily. 03/19/18   [provider]  meloxicam (MOBIC) 15 MG tablet Take 15 mg by mouth daily as needed for pain.  12/21/17   [provider]  omeprazole (PRILOSEC) 40 MG capsule Take 1 capsule (40 mg total) by mouth daily. 12/14/17   [name], MD  phentermine (ADIPEX-P) 37.5 MG tablet Take 37.5 mg by mouth daily. 03/15/18   [provider]  potassium chloride SA (K-DUR,KLOR-CON) 20 MEQ tablet Take 20 mEq by mouth daily.    [provider]  tiZANidine (ZANAFLEX) 4 MG tablet Take 4 mg by mouth every 8 (eight) hours as needed for spasms. 02/14/18   [provider]  traMADol (ULTRAM) 50 MG tablet Take 50 mg by mouth every 8 (eight) hours as needed for pain. 01/31/18   [provider]  triamterene-hydrochlorothiazide (MAXZIDE-25) 37.5-25 MG tablet Take 1 tablet by mouth daily. 02/14/18   [provider]  valsartan (DIOVAN) 320 MG tablet Take 1 tablet (320 mg total) by mouth daily. 05/20/18   [name], MD  solifenacin (VESICARE) 5 MG tablet Take 5 mg by mouth daily.  04/06/18  [provider]    Family History Family History  Problem Relation Age of Onset  . Diabetes Mother   . Hypertension Mother   . Diabetes Father   . Heart disease Paternal Aunt   . Cancer Paternal Aunt     Social History Social History   Tobacco Use  . Smoking status: Current Every Day Smoker    Packs/day: 0.50    Years: 24.00    Pack years: 12.00    Types: Cigarettes  . Smokeless tobacco: Never Used  Substance Use Topics  . Alcohol use: No      Alcohol/week: 0.0 standard drinks    Comment: quit Nov. 2014  . Drug use: No     Allergies   Aspirin   Review of Systems Review of Systems  Respiratory: Positive for shortness of breath.   All other systems reviewed and are negative.    Physical Exam Updated Vital Signs BP (!) 142/85   Pulse (!) 57   Temp 98.1 F (36.7 C) (Oral)   Resp 19   SpO2 95%   Physical Exam  Constitutional: She is oriented to person, place, and time. She appears well-developed and well-nourished. No distress.  Obese female resting in bed comfortably in no acute discomfort.  HENT:  Head: Atraumatic.  Eyes: Conjunctivae are normal.  Neck: Neck supple. No JVD present.  Cardiovascular: Normal rate and regular rhythm. Exam reveals no gallop.  Pulmonary/Chest: No respiratory distress. She has decreased breath sounds. She has no wheezes. She has no rhonchi. She has rales.  Abdominal: Soft. There is no tenderness.  Musculoskeletal:       Right lower leg: She exhibits edema.       Left lower leg: She exhibits edema.  Neurological: She is alert and oriented to person, place, and time.  Skin: No rash noted.  Psychiatric: She has a normal mood and affect.  Nursing note and vitals reviewed.    ED Treatments / Results  Labs (all labs ordered are listed, but only abnormal results are displayed) Labs Reviewed  BASIC METABOLIC PANEL - Abnormal; Notable for the following components:      Result Value   Glucose, Bld 113 (*)    All other components within normal limits  CBC WITH DIFFERENTIAL/PLATELET - Abnormal; Notable for the following components:   RDW 16.0 (*)    All other components within normal limits  BRAIN NATRIURETIC PEPTIDE  I-STAT TROPONIN, ED    EKG EKG Interpretation  Date/Time:  Friday July 01 2018  08:21:09 EDT Ventricular Rate:  73 PR Interval:    QRS Duration: 88 QT Interval:  404 QTC Calculation: 446 R Axis:   9 Text Interpretation:  Sinus rhythm Borderline  repolarization abnormality Baseline wander in lead(s) V5 Confirmed by Lennice Sites [phone]) on 07/01/2018 10:14:41 AM   Radiology Dg Chest 2 View  Result Date: 07/01/2018 CLINICAL DATA:  Shortness of breath and congestion five days. EXAM: CHEST - 2 VIEW COMPARISON:  03/22/2018 FINDINGS: Lungs are somewhat hypoinflated without focal airspace consolidation or effusion. Stable borderline cardiomegaly. Mild degenerative change of the spine and shoulders. Prominent overlying soft tissues. IMPRESSION: No acute cardiopulmonary disease. Electronically Signed   By: Marin Olp M.D.   On: 07/01/2018 09:14    Procedures Procedures (including critical care time)  Medications Ordered in ED Medications  furosemide (LASIX) injection 80 mg (80 mg Intravenous Given 07/01/18 0845)     Initial Impression / Assessment and Plan / ED Course  I have reviewed the triage vital signs and the nursing notes.  Pertinent labs & imaging results that were available during my care of the patient were reviewed by me and considered in my medical decision making (see chart for details).     BP (!) 142/85   Pulse (!) 57   Temp 98.1 F (36.7 C) (Oral)   Resp 19   SpO2 95%    Final Clinical Impressions(s) / ED Diagnoses   Final diagnoses:  Shortness of breath    ED Discharge Orders    None     8:39 AM Patient with known history of diastolic CHF with an EF of 60-65% presents presenting with shortness of breath and peripheral edema suggestive of a CHF exacerbation.  Work-up initiated, Lasix given  11:46 AM EKG without acute ischemic changes, normal troponin, electrolytes panels are reassuring, normal WBC, normal H&H, normal BNP, chest x-ray without acute pulmonary disease, and after receiving IV Lasix, patient ambulate without hypoxia.  She is resting comfortably.  She is stable for discharge with outpatient follow-up.  Return precautions discussed.   Domenic Moras, PA-C 07/01/18 1152    Lennice Sites, DO 07/01/18 [phone]

## 2018-07-01 NOTE — ED Triage Notes (Signed)
Pt presents for evaluation of SOB starting Monday with worsening last night. Pt reports was unable to sleep. Hx of CHF, edema to all extremities. On Lasix.

## 2018-07-04 ENCOUNTER — Ambulatory Visit (INDEPENDENT_AMBULATORY_CARE_PROVIDER_SITE_OTHER): Payer: Medicaid Other | Admitting: Internal Medicine

## 2018-07-04 ENCOUNTER — Encounter: Payer: Self-pay | Admitting: Internal Medicine

## 2018-07-04 VITALS — BP 128/76 | HR 88 | Ht 62.0 in | Wt 245.0 lb

## 2018-07-04 DIAGNOSIS — J4530 Mild persistent asthma, uncomplicated: Secondary | ICD-10-CM

## 2018-07-04 DIAGNOSIS — F17210 Nicotine dependence, cigarettes, uncomplicated: Secondary | ICD-10-CM

## 2018-07-04 DIAGNOSIS — G4733 Obstructive sleep apnea (adult) (pediatric): Secondary | ICD-10-CM | POA: Diagnosis not present

## 2018-07-04 MED ORDER — BUDESONIDE-FORMOTEROL FUMARATE 160-4.5 MCG/ACT IN AERO: 2.0000 | INHALATION_SPRAY | Freq: Two times a day (BID) | RESPIRATORY_TRACT | 0 refills | Status: DC

## 2018-07-04 MED ORDER — CEFDINIR 300 MG PO CAPS: 300.0000 mg | ORAL_CAPSULE | Freq: Two times a day (BID) | ORAL | 0 refills | Status: DC

## 2018-07-04 NOTE — Progress Notes (Signed)
Subjective:     Patient ID: Lori Bean, female   DOB: 02-20-1971      MRN: [id number]    Brief patient profile:  57 yobf MO quit smoking 04/2017 healthy child/ 2 IUP's  With baseline wt < 200 with new doe x 2012 assoc with wt gain gain x 2016 referred to pulmonary clinic 06/04/2017 by Dr   Lori Bean   Admit date: 12/17/2016 Discharge date: 12/18/2016  Discharge Diagnoses:  Possible asthma exacerbation Essential hypertension Chronic diastolic heart failure Prediabetes Morbid obesity  History of present illness:  47 y.o.femalewith medical history significant of Asthma and OSA (reversibility demonstrated on PFTs in Feb this year), smoking, HTN, CHF grade 2 diastolic dysfunction. Patient presents to the ED with c/o SOB. Symptoms onset at rest yesterday, also has 10lb weight gain over past 3 weeks but states she is compliant with lasix 80 BID. 2 albuterol nebs before seeing PCP this AM. PCP sent her to ED for evaluation.  Hospital Course:  Possible asthma exacerbation -Patient states that her wheezing has actually improved and she is feeling better today -She recently finished a course of prednisone back in February 2018 -She has been following up with the pulmonary clinic -Chest x-ray -Spoke with Dr. [name], patient's pulmonologist. Of note, progress note from Dr. [name] back in January 2018 stated that he was not convinced of her asthma. At this time, Dr. [name] recommended no change in patient's medications. Follow-up as an outpatient. -Continue Advair Diskus, pro-air, QVAR -Patient was placed on prednisone 40 mg, will continue this for 4 days.  Essential hypertension -Continue amlodipine -Lasix was discontinued as she does complain of chronic cough  Chronic diastolic heart failure -Echocardiogram 05/24/2015 showed EF of 8841%, grade 2 diastolic dysfunction -Currently appears euvolemic and compensated on exam. -BNP 24.8 -Continue home Lasix dose with potassium  supplementation  Prediabetes -Hemoglobin A1c was 6.1 back in 2016. This can be followed and managed as an outpatient by patient's primary care physician.  Morbid obesity -BMI 47.1 -Patient only talk to her primary care physician regarding weight loss and lifestyle modifications. -Patient states she feels that she has gained 10-15 pounds in the past several weeks. States she has been watching what she eats. Admits that she has been using different scales at different doctors offices. Inquires about whether there is a program that offers free scales. Case management stated that this program is no longer available.       06/04/2017 1st   office visit/ Quinita Kostelecky  Re transition of care  Chief Complaint  Patient presents with  . Pulmonary Consult    Referred by Dr. Vista Bean. Pt c/o DOE for approx 6 years, worse over the past wk to the point that she is winded just walking from room to room at home. She states also having some prod cough with large amounts of black colored sputum.  She has CP "stabbing pain" comes and goes and lasts for several minutes. She has a rash on her arm- not improving after seeing derm. She has also noticed blurred vision. She is using her proair inhaler 3-4 x per day.   first started on proair helped some, then symbicort sev months prior to OV>  Not much response, even neb now doesn't always help. 6 years prior to OV  Trouble flat and x 6 months sleeping upright due to sob immediately on lying back Doe progressive   But esp over the last week to room to room now Coughing more since 2  months > black mucus assoc with cp with coughing  rec Plan A = Automatic = symbicort 80 Take 2 puffs first thing in am and then another 2 puffs about 12 hours later.  Pantoprazole (protonix) 40 mg (or prilosec 20 x 2)    Take  30-60 min before first meal of the day and Pepcid (famotidine)  20 mg one @  bedtime until return to office - this is the best way to tell whether stomach acid is  contributing to your problem.   Plan B = Backup Only use your albuterol as a rescue medication Plan C = Nebulizer  GERD diet   07/22/18 NP eval [name]  Continue on Symbicort Twice daily  . Rinse after use.  Work on not smoking    10/28/2017  f/u ov/Lori Bean re: sob/ ? How much is asthma / still smoking  Chief Complaint  Patient presents with  . Follow-up    Breathing has been worse for the past wk- SOB walking from room to room. She is also c/o wheezing and some chest tightness. She is using her albuterol inhaler 4-5 x per day on average.   Dyspnea: room to room x one week Cough: more than usual x one week thick clear/ assoc st and dysphagia ? On ppi ac ?  Sleep: ok on cpap s 02   Using saba q am instead of prn  Midline pain with coughing only  rec Plan A = Automatic = change symbicort to 160 Take 2 puffs first thing in am and then another 2 puffs about 12 hours later.  Work on inhaler technique:     Plan B = Backup Only use your albuterol as a rescue medication Plan C = Crisis - only use your albuterol nebulizer if you first try Plan B       03/04/2018  f/u ov/Loren Sawaya re: AB on symbicort 160/ acei/ coreg 25 bid  Chief Complaint  Patient presents with  . Follow-up    still SOB, sore throat, difficulty swallowing, productive cough   Dyspnea:  Room to room Cough: is worse at hs / like choking /  [place] mucus/ nose stuffy now all x 2 months on acei gradually worse  Sleep: on cpap with some cough/gag  SABA use:  None rec Plan A = Automatic = change symbicort to 80 Take 2 puffs first thing in am and then another 2 puffs about 12 hours later.  Work on inhaler technique:   Plan B = Backup Only use your albuterol as a rescue medication Plan C = Crisis - only use your albuterol nebulizer if you first try Plan B stop lisinopril and take avapro (ibesartan) 300 mg one daily in its place  Augmentin 875 mg take one pill twice daily  X 10 days - Prednisone 10 mg take  4 each am x 2 days,   2  each am x 2 days,  1 each am x 2 days and stop  Please schedule a follow up office visit in 4 weeks, sooner if needed to see [name] NP but bring all meds with you      NP ov 04/01/18 rec Increase Symbicort 160/4.33mcg 2 puffs Twice daily  , rinse after use.  Mucinex DM Twice daily  As needed  Cough/congestion  Work on not smoking .  Continue on BIPAP At bedtime   Low salt diet , keep legs elevated.  Follow up with Cardiology as discussed.   07/01/18 ER eval for sob Dx  chf  rx lasix     07/04/2018  Extended transition of care  f/u ov/Raivyn Kabler post er eval  re: copd/ab still smoking/ over using saba, did not use symb yet today and hfa poor / did not bring meds Chief Complaint  Patient presents with  . Follow-up    chronic asthma, patient uses albuterol TID  Dyspnea:  MMRC2 = can't walk a nl pace on a flat grade s sob but does fine slow and flat can shop HT/ [employer]  Cough: worse x sev weeks, better while on zpak worse off/ brown mucus 24/7 / assoc with overt HB Sleeping: on cpap, not bipap, bothered by cough / has not had any sleep f/u by [name] in years  SABA use: as above    No obvious day to day or daytime variability or assoc excess/ purulent sputum or mucus plugs or hemoptysis or cp or chest tightness, subjective wheeze or overt sinus symptoms.    . Also denies any obvious fluctuation of symptoms with weather or environmental changes or other aggravating or alleviating factors except as outlined above   No unusual exposure hx or h/o childhood pna/ asthma or knowledge of premature birth.  Current Allergies, Complete Past Medical History, Past Surgical History, Family History, and Social History were reviewed in [employer] record.  ROS  The following are not active complaints unless bolded Hoarseness, sore throat, dysphagia, dental problems, itching, sneezing,  nasal congestion or discharge of excess mucus or purulent secretions, ear ache,   fever, chills,  sweats, unintended wt loss or wt gain, classically pleuritic or exertional cp,  orthopnea pnd or arm/hand swelling  or leg swelling, presyncope, palpitations, abdominal pain, anorexia, nausea, vomiting, diarrhea  or change in bowel habits or change in bladder habits, change in stools or change in urine, dysuria, hematuria,  rash, arthralgias, visual complaints, headache, numbness, weakness or ataxia or problems with walking or coordination,  change in mood or  memory.        Current Meds  - NOTE:   Unable to verify as accurately reflecting what pt takes     Medication Sig  . albuterol (PROAIR HFA) 108 (90 Base) MCG/ACT inhaler Inhale 2 puffs into the lungs every 4 (four) hours as needed for wheezing or shortness of breath.  [name] albuterol (PROVENTIL) (2.5 MG/3ML) 0.083% nebulizer solution Take 2.5 mg by nebulization every 4 (four) hours as needed for wheezing or shortness of breath.  [name] amitriptyline (ELAVIL) 25 MG tablet Take 25 mg by mouth at bedtime.  [name] amLODipine (NORVASC) 10 MG tablet Take 1 tablet (10 mg total) by mouth daily.  . ARIPiprazole (ABILIFY) 20 MG tablet Take 1 tablet (20 mg total) by mouth at bedtime. (Patient taking differently: Take 20 mg by mouth 2 (two) times daily. )  . benzonatate (TESSALON) 100 MG capsule Take 1 capsule (100 mg total) by mouth 3 (three) times daily as needed for cough.  . budesonide-formoterol (SYMBICORT) 160-4.5 MCG/ACT inhaler Inhale 2 puffs into the lungs 2 (two) times daily.  . carvedilol (COREG) 25 MG tablet Take 1 tablet (25 mg total) by mouth 2 (two) times daily.  . DULoxetine (CYMBALTA) 30 MG capsule Take 30 mg by mouth 2 (two) times daily.  . famotidine (PEPCID) 20 MG tablet One at bedtime  . furosemide (LASIX) 80 MG tablet Take 1 tablet (80 mg total) by mouth 2 (two) times daily.  [name] LYRICA 150 MG capsule Take 150 mg by mouth 2 (two) times daily.  [name]  meloxicam (MOBIC) 15 MG tablet Take 15 mg by mouth daily as needed for pain.  [name] omeprazole (PRILOSEC)  40 MG capsule Take 1 capsule (40 mg total) by mouth daily.  . phentermine (ADIPEX-P) 37.5 MG tablet Take 37.5 mg by mouth daily.  . potassium chloride SA (K-DUR,KLOR-CON) 20 MEQ tablet Take 20 mEq by mouth daily.  [name] tiZANidine (ZANAFLEX) 4 MG tablet Take 4 mg by mouth every 8 (eight) hours as needed for spasms.  . traMADol (ULTRAM) 50 MG tablet Take 50 mg by mouth every 8 (eight) hours as needed for pain.  [name] triamterene-hydrochlorothiazide (MAXZIDE-25) 37.5-25 MG tablet Take 1 tablet by mouth daily.  . valsartan (DIOVAN) 320 MG tablet Take 1 tablet (320 mg total) by mouth daily.                   Objective:   Physical Exam    amb obese bf nad    07/04/2018      245  03/04/2018       240  10/28/2017       252   06/04/17 250 lb (113.4 kg)  05/14/17 250 lb 12.8 oz (113.8 kg)  05/06/17 242 lb (109.8 kg)    Vital signs reviewed - Note on arrival 02 sats 96 % on RA     HEENT: nl dentition, turbinates bilaterally, and oropharynx. Nl external ear canals without cough reflex   NECK :  without JVD/Nodes/TM/ nl carotid upstrokes bilaterally   LUNGS: no acc muscle use,  Nl contour chest  mild mid exp rhonchi  bilaterally without cough on insp or exp maneuvers   CV:  RRR  no s3 or murmur or increase in P2, and no pitting edema   ABD:  soft and nontender with nl inspiratory excursion in the supine position. No bruits or organomegaly appreciated, bowel sounds nl  MS:  Nl gait/ ext warm without deformities, calf tenderness, cyanosis or clubbing No obvious joint restrictions / lymphedema changes all 4 ext Legs > arms   SKIN: warm and dry without lesions    NEURO:  alert, approp, nl sensorium with  no motor or cerebellar deficits apparent.        I personally reviewed images and agree with radiology impression as follows:  CXR:   07/01/18 No acute cardiopulmonary disease.   Labs ordered/ reviewed:      Chemistry      Component Value Date/Time   NA 137 07/01/2018 0841    K 4.2 07/01/2018 0841   CL 107 07/01/2018 0841   CO2 24 07/01/2018 0841   BUN 14 07/01/2018 0841   CREATININE 0.73 07/01/2018 0841   CREATININE 0.62 10/09/2016 0857      Component Value Date/Time   CALCIUM 9.2 07/01/2018 0841   ALKPHOS 95 06/07/2018 1024   AST 6 06/07/2018 1024   ALT 17 06/07/2018 1024   BILITOT 0.3 06/07/2018 1024        Lab Results  Component Value Date   WBC 10.3 07/01/2018   HGB 14.5 07/01/2018   HCT 45.5 07/01/2018   MCV 94.8 07/01/2018   PLT 240 07/01/2018       EOS  0.3                                     07/01/18   Lab Results  Component Value Date   DDIMER 0.47 05/06/2017      Lab Results  Component Value Date   TSH 0.72 10/28/2017     Lab Results  Component Value Date   PROBNP 16.5  [id number]           Assessment:

## 2018-07-04 NOTE — Patient Instructions (Addendum)
Plan A = Automatic = symbicort 160 Take 2 puffs first thing in am and then another 2 puffs about 12 hours later.    Work on inhaler technique:  relax and gently blow all the way out then take a nice smooth deep breath back in, triggering the inhaler at same time you start breathing in.  Hold for up to 5 seconds if you can. Blow out thru nose. Rinse and gargle with water when done      Plan B = Backup Only use your albuterol as a rescue medication to be used if you can't catch your breath by resting or doing a relaxed purse lip breathing pattern.  - The less you use it, the better it will work when you need it. - Ok to use the inhaler up to 2 puffs  every 4 hours if you must but call for appointment if use goes up over your usual need - Don't leave home without it !!  (think of it like the spare tire for your car)   omnicef 300 mg twice daily x 10 days and your mucus should turn white   For cough > mucinex dm up to 1200 mg every 12 hours   The key is to stop smoking completely before smoking completely stops you!    Please schedule a follow up office visit in 2 weeks, sooner if needed  with all medications /inhalers/ solutions in hand so we can verify exactly what you are taking. This includes all medications from all doctors and over the counters   Late add: f/u alva for osa

## 2018-07-06 ENCOUNTER — Encounter: Payer: Self-pay | Admitting: Internal Medicine

## 2018-07-06 NOTE — Assessment & Plan Note (Signed)
Body mass index is 44.81 kg/m.  -  trending up Lab Results  Component Value Date   TSH 0.72 10/28/2017     Contributing to gerd risk/ doe/reviewed the need and the process to achieve and maintain neg calorie balance > defer f/u primary care including intermittently monitoring thyroid status      I had an extended discussion with the patient reviewing all relevant studies completed to date and  lasting 25 minutes of a 40  minute post er office  visit addressing  severe non-specific but potentially very serious refractory respiratory symptoms of uncertain and potentially multiple  etiologies.   Each maintenance medication was reviewed in detail including most importantly the difference between maintenance and prns and under what circumstances the prns are to be triggered using an action plan format that is not reflected in the computer generated alphabetically organized AVS.    Please see AVS for specific instructions unique to this office visit that I personally wrote and verbalized to the the pt in detail and then reviewed with pt  by my nurse highlighting any changes in therapy/plan of care  recommended at today's visit.

## 2018-07-06 NOTE — Assessment & Plan Note (Signed)
PFT's  11/10/2016  FEV1 2.06 (92 % ) ratio 81   p 24 % improvement from saba p ? prior to study with DLCO  93/94 % corrects to 102  % for alv volume   - 06/04/2017  After extensive coaching HFA effectiveness =    75% > try symbicort 80 2bid  - Spirometry 10/28/2017  FEV1 1.01 (45%)  Ratio 67  p am symb/saba/smoking  - 10/28/2017   try symb 160 2bid  - 03/04/2018  A try symb 80 2bid due to uacs component  - 04/01/18 try sym 160 2bid  - 07/04/2018  After extensive coaching inhaler device,  effectiveness =    75% (no breath hold at baselin, sort Ti)  - Spirometry 07/04/2018  FEV1 1.1 (50%)  Ratio 69  Prior to rx     DDX of  difficult airways management almost all start with A and  include Adherence, Ace Inhibitors, Acid Reflux, Active Sinus Disease, Alpha 1 Antitripsin deficiency, Anxiety masquerading as Airways dz,  ABPA,  Allergy(esp in young), Aspiration (esp in elderly), Adverse effects of meds,  Active smoking or vaping, A bunch of PE's (a small clot burden can't cause this syndrome unless there is already severe underlying pulm or vascular dz with poor reserve) plus two Bs  = Bronchiectasis and Beta blocker use..and one C= CHF     Adherence is always the initial "prime suspect" and is a multilayered concern that requires a "trust but verify" approach in every patient - starting with knowing how to use medications, especially inhalers, correctly, keeping up with refills and understanding the fundamental difference between maintenance and prns vs those medications only taken for a very short course and then stopped and not refilled.  - see hfa teaching / note has not yet used "first thing in am meds" by time of ov today - return with all meds in hand using a trust but verify approach to confirm accurate Medication  Reconciliation The principal here is that until we are certain that the  patients are doing what we've asked, it makes no sense to ask them to do more.    Active smoking also at top of list  of usual suspects (see separate a/p)    Allergy /asthma flare> not severe enough to consider pred rx at this point with minimal curvature to f/v to support airflow obst at all   ? Anxiety > usually at the bottom of this list of usual suspects but should be much higher on this pt's based on H and P and note already on psychotropics and may interfere with adherence and also interpretation of response or lack thereof to symptom management which can be quite subjective.   ? Active sinusitis/ rhinitis > rx omnicef x 10 days/ ? Sinus ct if not improving    ? Acid (or non-acid) GERD > always difficult to exclude as up to 75% of pts in some series report no assoc GI/ Heartburn symptoms> rec continue max (24h)  acid suppression    ? BB effects > ideally needs to be on more selective BB but in absence of def evidence of asthma flare no need to change today - consider when returns for med rec   ? CHF > very unlikely with nl cxr and bnp during flare

## 2018-07-06 NOTE — Assessment & Plan Note (Signed)
CPAP titration 07/2017 >Trial of BiPAP therapy on 16/12 cm H2O with a Medium size    Referred back to [name]

## 2018-07-20 ENCOUNTER — Encounter: Payer: Self-pay | Admitting: Primary Care

## 2018-07-20 ENCOUNTER — Ambulatory Visit: Payer: Medicaid Other | Admitting: Primary Care

## 2018-07-20 ENCOUNTER — Ambulatory Visit (INDEPENDENT_AMBULATORY_CARE_PROVIDER_SITE_OTHER): Payer: Medicaid Other | Admitting: Internal Medicine

## 2018-07-20 ENCOUNTER — Other Ambulatory Visit: Payer: Medicaid Other

## 2018-07-20 VITALS — BP 108/76 | HR 73 | Temp 98.6°F | Ht 62.0 in | Wt 248.2 lb

## 2018-07-20 DIAGNOSIS — J0110 Acute frontal sinusitis, unspecified: Secondary | ICD-10-CM | POA: Diagnosis not present

## 2018-07-20 DIAGNOSIS — J4530 Mild persistent asthma, uncomplicated: Secondary | ICD-10-CM

## 2018-07-20 DIAGNOSIS — I5032 Chronic diastolic (congestive) heart failure: Secondary | ICD-10-CM | POA: Diagnosis not present

## 2018-07-20 LAB — PULMONARY FUNCTION TEST
DL/VA % pred: 104 %
DL/VA: 4.74 ml/min/mmHg/L
DLCO cor % pred: 78 %
DLCO cor: 17.01 ml/min/mmHg
DLCO unc % pred: 81 %
DLCO unc: 17.56 ml/min/mmHg
FEF 25-75 Post: 1.37 L/sec
FEF 25-75 Pre: 0.77 L/sec
FEF2575-%Change-Post: 76 %
FEF2575-%Pred-Post: 57 %
FEF2575-%Pred-Pre: 32 %
FEV1-%Change-Post: 15 %
FEV1-%Pred-Post: 60 %
FEV1-%Pred-Pre: 52 %
FEV1-Post: 1.33 L
FEV1-Pre: 1.15 L
FEV1FVC-%Change-Post: 5 %
FEV1FVC-%Pred-Pre: 87 %
FEV6-%Change-Post: 9 %
FEV6-%Pred-Post: 67 %
FEV6-%Pred-Pre: 61 %
FEV6-Post: 1.77 L
FEV6-Pre: 1.61 L
FEV6FVC-%Pred-Post: 102 %
FEV6FVC-%Pred-Pre: 102 %
FVC-%Change-Post: 9 %
FVC-%Pred-Post: 65 %
FVC-%Pred-Pre: 59 %
FVC-Post: 1.77 L
FVC-Pre: 1.61 L
Post FEV1/FVC ratio: 75 %
Post FEV6/FVC ratio: 100 %
Pre FEV1/FVC ratio: 71 %
Pre FEV6/FVC Ratio: 100 %
RV % pred: 158 %
RV: 2.61 L
TLC % pred: 97 %
TLC: 4.63 L

## 2018-07-20 MED ORDER — FLUTTER DEVI: 0 refills | Status: DC

## 2018-07-20 MED ORDER — CETIRIZINE HCL 10 MG PO TABS: 10.0000 mg | ORAL_TABLET | Freq: Every day | ORAL | 3 refills | Status: DC

## 2018-07-20 NOTE — Progress Notes (Signed)
@Patient  ID: Lori Bean, female    DOB: 07-25-1971, 47 y.o.   MRN: [id number]  Chief Complaint  Patient presents with  . Follow-up    less SOB-cough with brown mucus-wheezing    Referring provider: [name], PA  HPI: 47 year old female, current every day smoker (12 pack year history). PMH mild persistent asthma, OSA, CHF, lymphedema. Patient of Dr. Melvyn Novas, last seen on 07/04/18 for acute sinusitis symptoms treated with Omnicef x 10 days. If not better consider CT sinuses.   07/22/2018  Patient presents today for 2 week follow-up with PFTs. Completed Omnicef, states that it helped some. Still has a little bit of brown mucus coming up but not as thick as it was. Some cough, less shortness of breath. Taking Symbicort 160 twice daily, sometimes misses her dose. She did not bring in her medications today to go over. Continues to smoke 1/2 pack daily (prior 2 ppd).   Testing reviewed:  07/20/2018 PFTs - FVC 1.77 (65%), FEV1 1.33 (60%), RATIO 75, DLCOcor 17.01 (78%) - no inhaler prior  07/20/2018  FENO 7, IgE  126  07/01/18 Eos absolute - 0.3  06/2018 BNP - 16 10/18- CXR showed no acute cardiopulmonary disease     Allergies  Allergen Reactions  . Aspirin Nausea And Vomiting    Immunization History  Administered Date(s) Administered  . Influenza Split 06/07/2013, 06/15/2015  . Influenza,inj,Quad PF,6+ Mos 07/22/2017  . Pneumococcal Polysaccharide-23 04/03/2014  . Rabies, IM 05/04/2016, 05/12/2016    Past Medical History:  Diagnosis Date  . Anginal pain ([place])    occ from asthma  . Anxiety   . Arthritis   . Asthma   . Bipolar affective disorder ([place])    Schizophrenia  . Bronchitis    hx of  . CHF (congestive heart failure) ([place])   . Depression   . GERD (gastroesophageal reflux disease)   . Heart murmur   . Hypertension    takes meds  . Lymphedema 06/07/2018  . Morbid (severe) obesity due to excess calories ([place])   . Neuropathy    left leg , "from back  surgery"  . Overactive bladder   . Schizophrenia ([place])   . Sciatica   . Shortness of breath   . Sleep apnea   . Snoring disorder    Pt stated my boyfriend always wakes me up and tells me to breathe    Tobacco History: Social History   Tobacco Use  Smoking Status Current Every Day Smoker  . Packs/day: 0.50  . Years: 24.00  . Pack years: 12.00  . Types: Cigarettes  Smokeless Tobacco Never Used   Ready to quit: Not Answered Counseling given: Not Answered   Outpatient Medications Prior to Visit  Medication Sig Dispense Refill  . albuterol (PROAIR HFA) 108 (90 Base) MCG/ACT inhaler Inhale 2 puffs into the lungs every 4 (four) hours as needed for wheezing or shortness of breath.    [name] albuterol (PROVENTIL) (2.5 MG/3ML) 0.083% nebulizer solution Take 2.5 mg by nebulization every 4 (four) hours as needed for wheezing or shortness of breath.    [name] amitriptyline (ELAVIL) 25 MG tablet Take 25 mg by mouth at bedtime.    [name] amLODipine (NORVASC) 10 MG tablet Take 1 tablet (10 mg total) by mouth daily. 90 tablet 2  . ARIPiprazole (ABILIFY) 20 MG tablet Take 1 tablet (20 mg total) by mouth at bedtime. (Patient taking differently: Take 20 mg by mouth 2 (two) times daily. ) 30 tablet  0  . budesonide-formoterol (SYMBICORT) 160-4.5 MCG/ACT inhaler Inhale 2 puffs into the lungs 2 (two) times daily. 1 Inhaler 5  . carvedilol (COREG) 25 MG tablet Take 1 tablet (25 mg total) by mouth 2 (two) times daily. 180 tablet 2  . DULoxetine (CYMBALTA) 30 MG capsule Take 30 mg by mouth 2 (two) times daily.    . famotidine (PEPCID) 20 MG tablet One at bedtime 30 tablet 11  . furosemide (LASIX) 80 MG tablet Take 1 tablet (80 mg total) by mouth 2 (two) times daily. 180 tablet 2  . LYRICA 150 MG capsule Take 150 mg by mouth 2 (two) times daily.  2  . meloxicam (MOBIC) 15 MG tablet Take 15 mg by mouth daily as needed for pain.  0  . omeprazole (PRILOSEC) 40 MG capsule Take 1 capsule (40 mg total) by mouth daily. 90  capsule 0  . phentermine (ADIPEX-P) 37.5 MG tablet Take 37.5 mg by mouth daily.  1  . potassium chloride SA (K-DUR,KLOR-CON) 20 MEQ tablet Take 20 mEq by mouth daily.    [name] tiZANidine (ZANAFLEX) 4 MG tablet Take 4 mg by mouth every 8 (eight) hours as needed for spasms.  1  . traMADol (ULTRAM) 50 MG tablet Take 50 mg by mouth every 8 (eight) hours as needed for pain.  0  . triamterene-hydrochlorothiazide (MAXZIDE-25) 37.5-25 MG tablet Take 1 tablet by mouth daily.  5  . valsartan (DIOVAN) 320 MG tablet Take 1 tablet (320 mg total) by mouth daily. 30 tablet 5  . benzonatate (TESSALON) 100 MG capsule Take 1 capsule (100 mg total) by mouth 3 (three) times daily as needed for cough. 21 capsule 0  . budesonide-formoterol (SYMBICORT) 160-4.5 MCG/ACT inhaler Inhale 2 puffs into the lungs 2 (two) times daily. 1 Inhaler 0  . cefdinir (OMNICEF) 300 MG capsule Take 1 capsule (300 mg total) by mouth 2 (two) times daily. 20 capsule 0  . budesonide-formoterol (SYMBICORT) 160-4.5 MCG/ACT inhaler Inhale 2 puffs into the lungs 2 (two) times daily for 1 day. (Patient not taking: Reported on 07/01/2018) 1 Inhaler 0   No facility-administered medications prior to visit.     Review of Systems  Review of Systems  Constitutional: Negative.   HENT: Positive for congestion.   Respiratory: Positive for cough and wheezing.   Cardiovascular: Negative.    Physical Exam  BP 108/76 (BP Location: Right Arm, Cuff Size: Normal)   Pulse 73   Temp 98.6 F (37 C)   Ht 5\' 2"  (1.575 m)   Wt 248 lb 3.2 oz (112.6 kg)   SpO2 97%   BMI 45.40 kg/m  Physical Exam  Constitutional: She is oriented to person, place, and time. She appears well-developed and well-nourished. No distress.  Overweight   HENT:  Head: Normocephalic and atraumatic.  Eyes: Pupils are equal, round, and reactive to light. EOM are normal.  Neck: Normal range of motion. Neck supple.  Cardiovascular: Normal rate and regular rhythm.  +3 BLE (chronic  lymphedema)  Pulmonary/Chest: Effort normal.  Scattered rhonchi. NO wheeze. No resp distress.   Musculoskeletal: Normal range of motion.  Neurological: She is alert and oriented to person, place, and time.  Skin: Skin is warm and dry.  Psychiatric: She has a normal mood and affect. Her behavior is normal. Judgment and thought content normal.     Lab Results:  CBC    Component Value Date/Time   WBC 10.3 07/01/2018 0841   RBC 4.80 07/01/2018 0841   HGB 14.5  07/01/2018 0841   HCT 45.5 07/01/2018 0841   PLT 240 07/01/2018 0841   MCV 94.8 07/01/2018 0841   MCH 30.2 07/01/2018 0841   MCHC 31.9 07/01/2018 0841   RDW 16.0 (H) 07/01/2018 0841   LYMPHSABS 2.7 07/01/2018 0841   MONOABS 1.0 07/01/2018 0841   EOSABS 0.3 07/01/2018 0841   BASOSABS 0.0 07/01/2018 0841    BMET    Component Value Date/Time   NA 137 07/01/2018 0841   K 4.2 07/01/2018 0841   CL 107 07/01/2018 0841   CO2 24 07/01/2018 0841   GLUCOSE 113 (H) 07/01/2018 0841   BUN 14 07/01/2018 0841   CREATININE 0.73 07/01/2018 0841   CREATININE 0.62 10/09/2016 0857   CALCIUM 9.2 07/01/2018 0841   GFRNONAA >60 07/01/2018 0841   GFRNONAA 69 06/19/2015 1150   GFRAA >60 07/01/2018 0841   GFRAA 80 06/19/2015 1150    BNP    Component Value Date/Time   BNP 16.5 07/01/2018 0841   BNP 15.2 09/18/2016 1058    ProBNP    Component Value Date/Time   PROBNP 14.0 10/28/2017 1039    Imaging: Dg Chest 2 View  Result Date: 07/01/2018 CLINICAL DATA:  Shortness of breath and congestion five days. EXAM: CHEST - 2 VIEW COMPARISON:  03/22/2018 FINDINGS: Lungs are somewhat hypoinflated without focal airspace consolidation or effusion. Stable borderline cardiomegaly. Mild degenerative change of the spine and shoulders. Prominent overlying soft tissues. IMPRESSION: No acute cardiopulmonary disease. Electronically Signed   By: Marin Olp M.D.   On: 07/01/2018 09:14     Assessment & Plan:   Chronic asthma, mild persistent,  uncomplicated 54/02/5680 PFTs - FEV1 1.33 (60%), Ratio 75, DLCOcor 17.01 (78%) - no inhaler prior   IgE and Eos elevated  Start Zyrtec daily  Continue Symbicort 160 twice daily  Albuterol hfa 2 puffs every 4-6 hours as needed for sob/wheeze Start Mucinex twice daily for congestion  Flutter valve three times a day for congestion    Chronic diastolic heart failure (HCC) Chronic lymphedema BLE  Some weight gain in last year Continues lasix 80mg  twice a day   Sinusitis Some improvement with Omnicef Add mucinex and Zyrtec  If continues not to improve, consider CT sinuses    Follow up in 4 weeks   Martyn Ehrich, NP 07/22/2018

## 2018-07-20 NOTE — Progress Notes (Signed)
Patient completed full PFT today. 

## 2018-07-20 NOTE — Patient Instructions (Addendum)
Start Zyrtec- take daily during allergy season or until symptoms improve   Start Mucinex twice daily for congestion   Flutter valve three times a day for congestion   Continue Symbicort 2 puffs twice a day (every day) Back up- Albuterol rescue inhaler 2 puffs every 4-6 hours as needed only for sob/wheezing  Lab today- checking IgE   Follow up in 4 weeks with Dr. Melvyn Novas or Eustaquio Maize NP

## 2018-07-21 LAB — IGE: IgE (Immunoglobulin E), Serum: 126 kU/L — ABNORMAL HIGH (ref <=114)

## 2018-07-22 ENCOUNTER — Encounter: Payer: Self-pay | Admitting: Primary Care

## 2018-07-22 DIAGNOSIS — J329 Chronic sinusitis, unspecified: Secondary | ICD-10-CM | POA: Insufficient documentation

## 2018-07-22 NOTE — Assessment & Plan Note (Signed)
Some improvement with Omnicef Add mucinex and Zyrtec  If continues not to improve, consider CT sinuses

## 2018-07-22 NOTE — Assessment & Plan Note (Signed)
Chronic lymphedema BLE  Some weight gain in last year Continues lasix 80mg  twice a day

## 2018-07-22 NOTE — Assessment & Plan Note (Addendum)
07/20/2018 PFTs - FEV1 1.33 (60%), Ratio 75, DLCOcor 17.01 (78%) - no inhaler prior   IgE and Eos elevated  Start Zyrtec daily  Continue Symbicort 160 twice daily  Albuterol hfa 2 puffs every 4-6 hours as needed for sob/wheeze Start Mucinex twice daily for congestion  Flutter valve three times a day for congestion

## 2018-07-28 NOTE — Progress Notes (Signed)
Chart and office note reviewed in detail  > agree with a/p as outlined    

## 2018-07-29 IMAGING — CT CT RENAL STONE PROTOCOL
2 series · 13 of 42 positions shown, 17 images · non-contrast
Comparison: April 08, 2015

CLINICAL DATA: Right flank pain for 3 days

EXAM:
CT ABDOMEN AND PELVIS WITHOUT CONTRAST
TECHNIQUE: Multidetector CT imaging of the abdomen and pelvis was performed
following the standard protocol without oral or intravenous contrast
material administration.

[Series 3: coronal · coronal · 0.86mm/px · 12 of 163 slices shown, 15 images]
[im 13/163  lung]
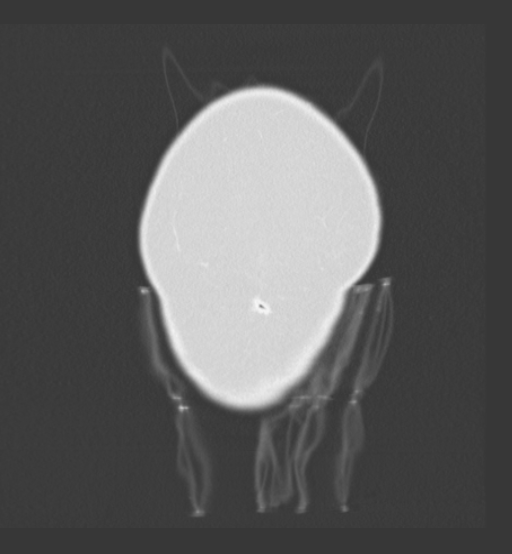
[im 19/163  soft-tissue]
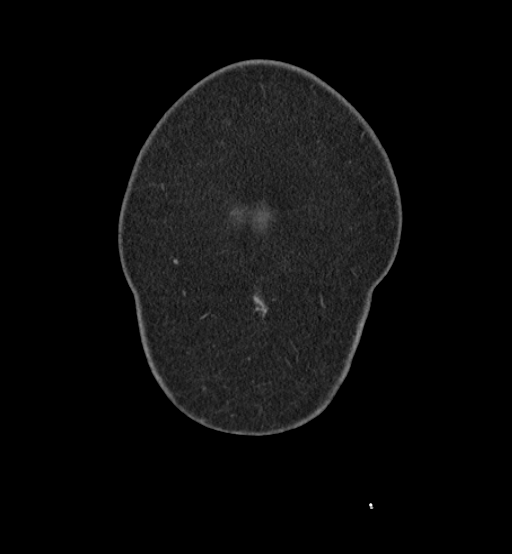
[im 19/163  bone]
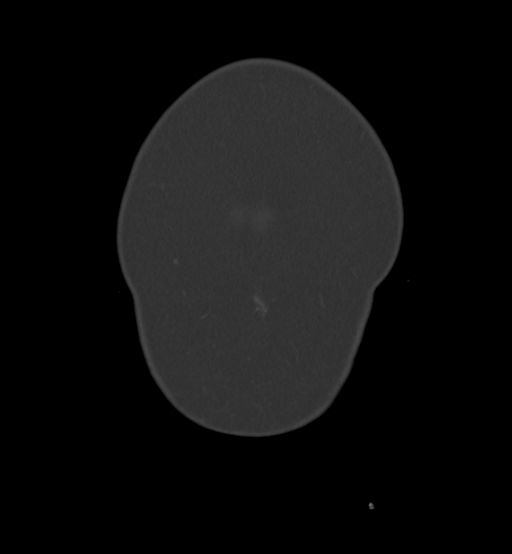
[im 25/163  lung]
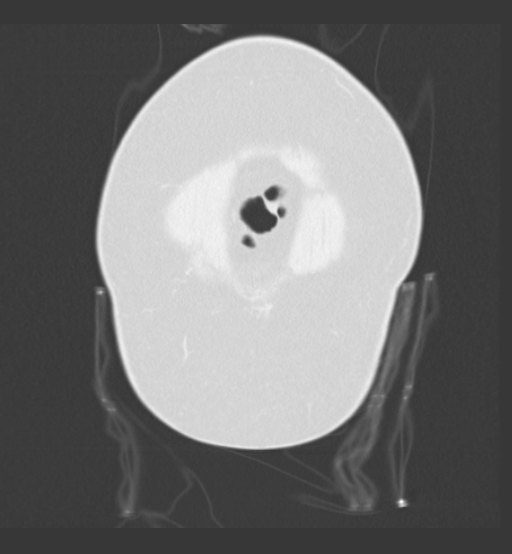
[im 37/163  soft-tissue]
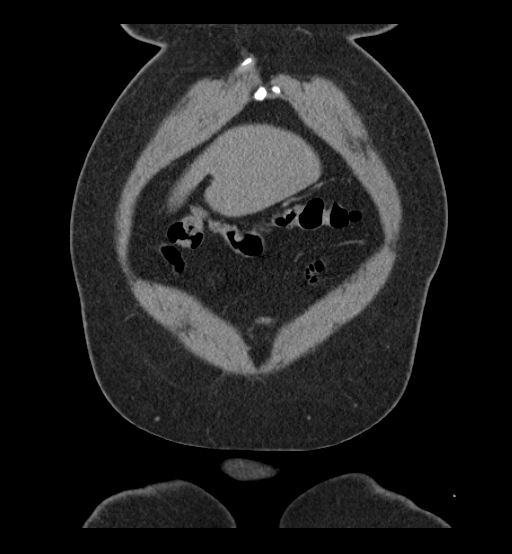
[im 38/163  lung]
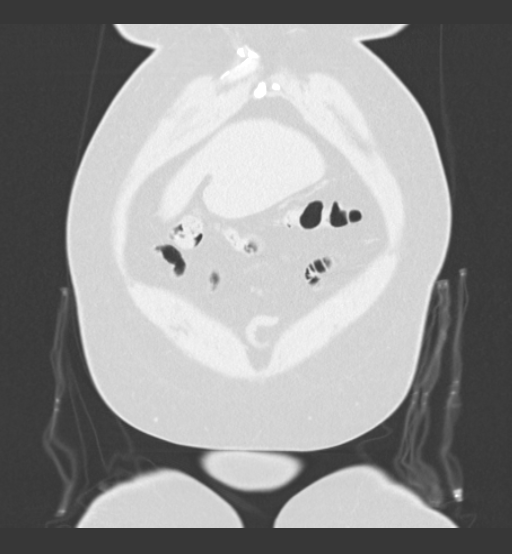
[im 50/163  soft-tissue]
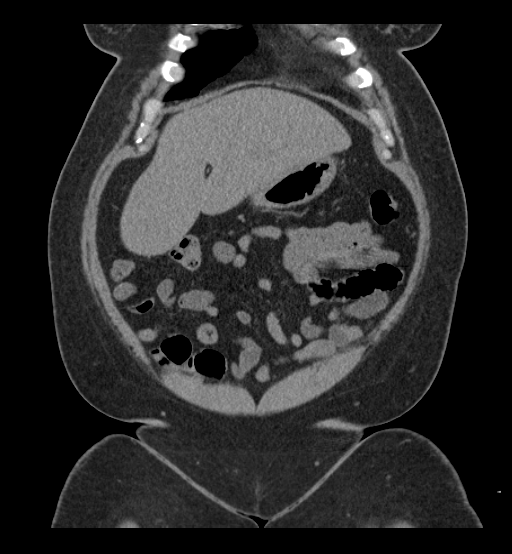
[im 50/163  lung]
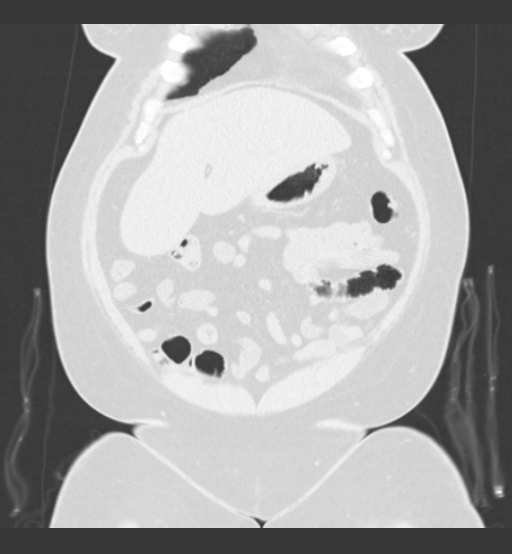
[im 63/163  soft-tissue]
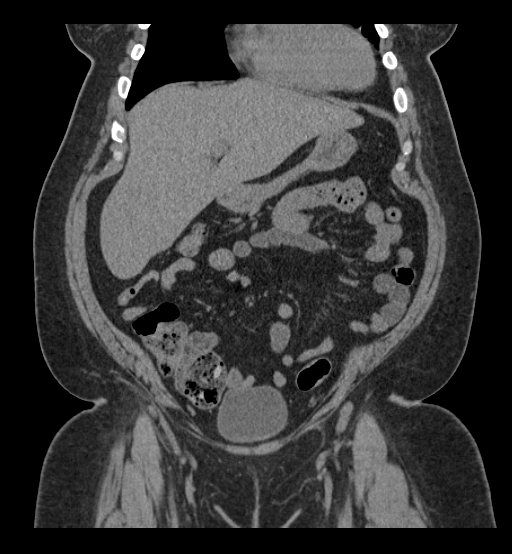
[im 88/163  soft-tissue]
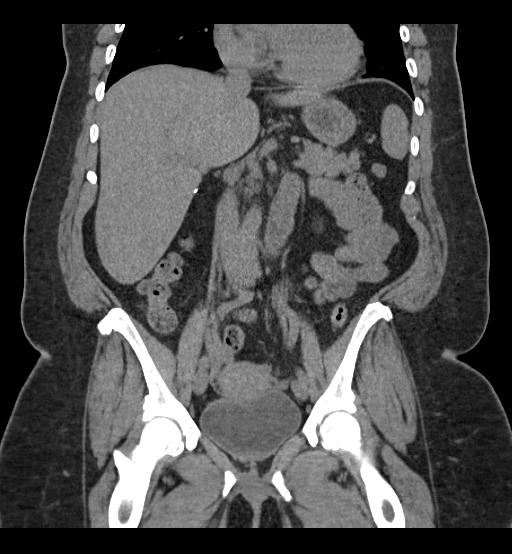
[im 100/163  soft-tissue]
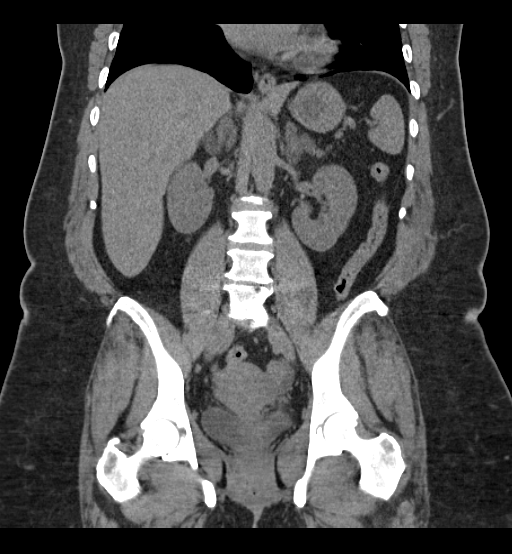
[im 113/163  soft-tissue]
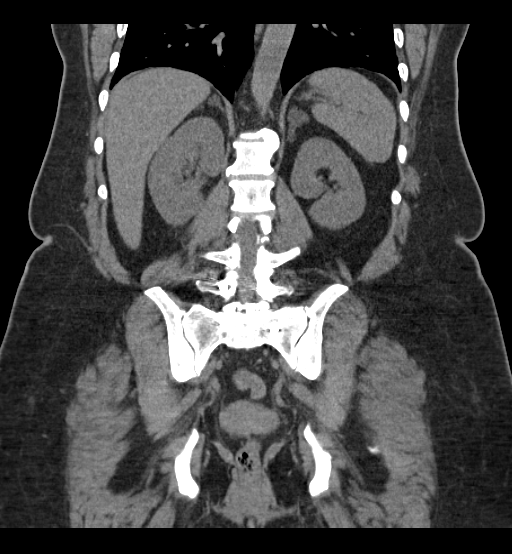
[im 127/163  soft-tissue]
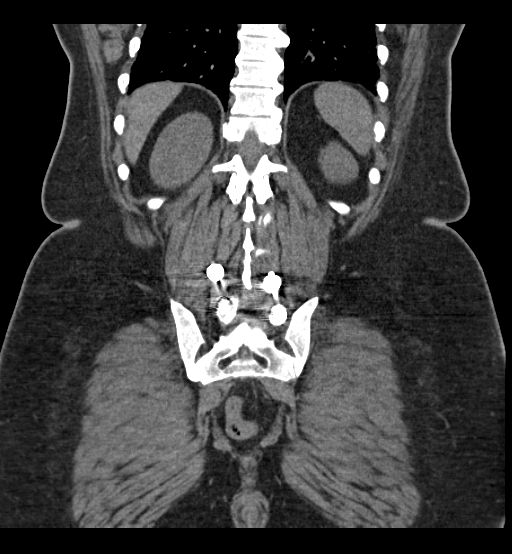
[im 145/163  soft-tissue]
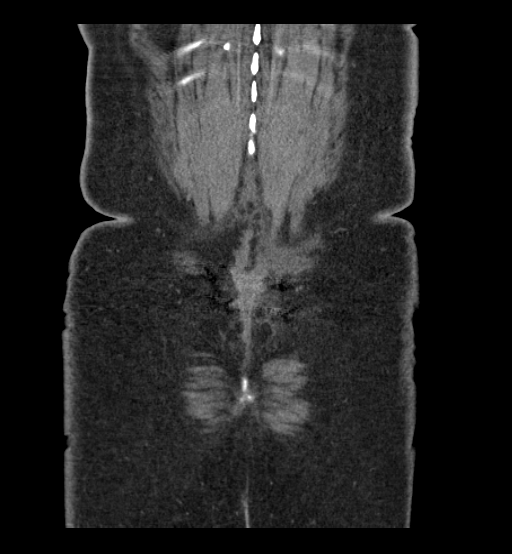
[im 145/163  bone]
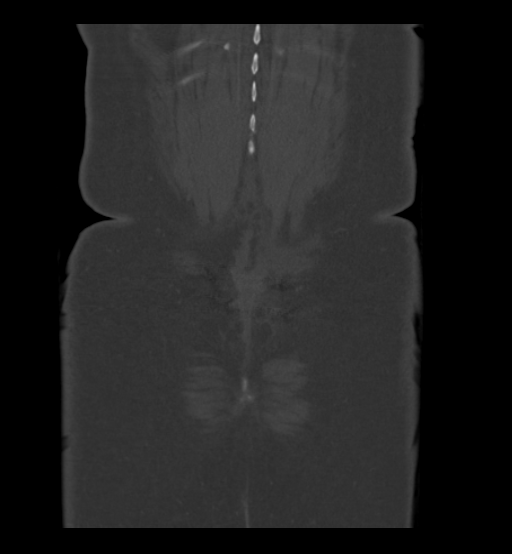

[Series 4: sagittal · sagittal · 0.74mm/px · 1 of 198 slices shown, 2 images]
[im 93/198  soft-tissue]
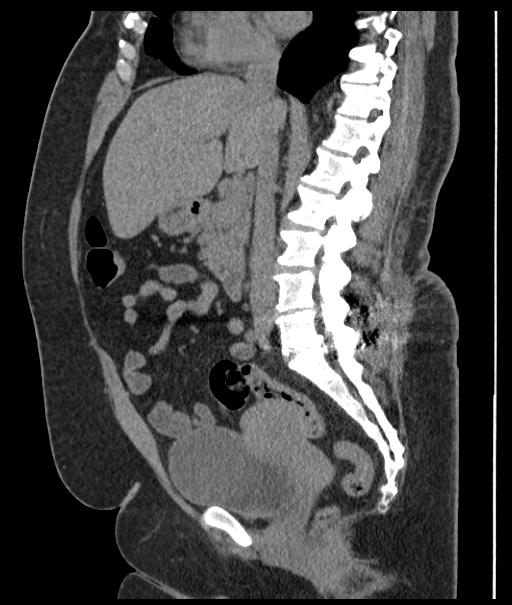
[im 93/198  bone]
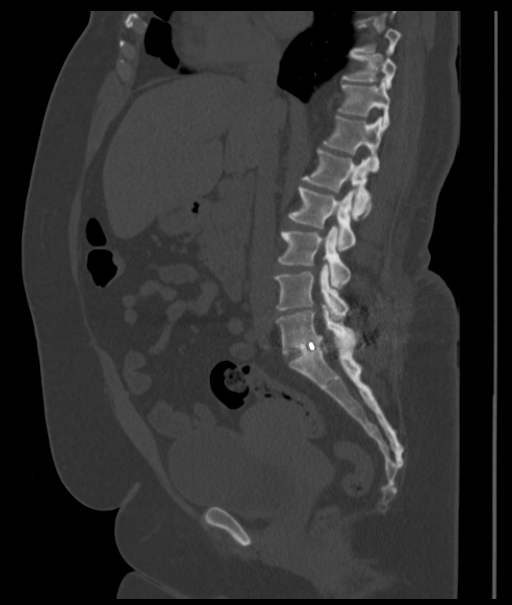

[13 of 42 positions shown; findings below may reference images not displayed]

FINDINGS: Lower chest: There is scarring in both lung bases, stable.

Hepatobiliary: Liver measures 18.9 cm in length. No focal liver
lesions are apparent on this noncontrast enhanced study. Gallbladder
is absent. There is no biliary duct dilatation.

Pancreas: There is no pancreatic mass or inflammatory focus.

Spleen: No splenic lesions are evident.

Adrenals/Urinary Tract: There is adrenal hypertrophy bilaterally,
more notable on the left than on the right, stable. The appearance
of the adrenals is stable compared to the previous study. Kidneys
bilaterally show no evident mass or hydronephrosis on either side.
There is no renal or ureteral calculus on either side. Urinary
bladder is midline with wall thickness within normal limits.

Stomach/Bowel: There are multiple sigmoid diverticula without
diverticulitis. There are less frequent diverticulum in the
ascending colon, again without diverticulitis. There is no
appreciable bowel wall or mesenteric thickening. There is no bowel
obstruction. No free air or portal venous air.

Vascular/Lymphatic: There is no abdominal aortic aneurysm. No
vascular lesions are evident on this noncontrast enhanced study.
There is no appreciable adenopathy in the abdomen or pelvis.

Reproductive: Uterus is anteverted. There is no pelvic mass or
pelvic fluid collection.

Other: The appendix appears unremarkable. There is no abscess or
ascites in the abdomen or pelvis. There is fat in the left inguinal
ring.

Musculoskeletal: There is postoperative change in the lower lumbar
region. There are no blastic or lytic bone lesions. There is
degenerative change in the lower lumbar spine region. There is no
intramuscular or abdominal wall lesion.
IMPRESSION: No renal or ureteral calculi.  No hydronephrosis.

Multiple colonic diverticula without diverticulitis. No bowel
obstruction. No abscess. Appendix appears normal.

Postoperative change lower lumbar spine with degenerative change in
the lower lumbar spine.

Prominent liver without focal lesion on this noncontrast enhanced
study.

## 2018-08-17 ENCOUNTER — Ambulatory Visit: Payer: Medicaid Other | Admitting: Primary Care

## 2018-08-17 ENCOUNTER — Encounter: Payer: Self-pay | Admitting: Primary Care

## 2018-08-17 DIAGNOSIS — J449 Chronic obstructive pulmonary disease, unspecified: Secondary | ICD-10-CM | POA: Insufficient documentation

## 2018-08-17 MED ORDER — TIOTROPIUM BROMIDE MONOHYDRATE 1.25 MCG/ACT IN AERS: 2.0000 | INHALATION_SPRAY | Freq: Every day | RESPIRATORY_TRACT | 6 refills | Status: DC

## 2018-08-17 MED ORDER — MONTELUKAST SODIUM 10 MG PO TABS: 10.0000 mg | ORAL_TABLET | Freq: Every day | ORAL | 11 refills | Status: DC

## 2018-08-17 MED ORDER — NICOTINE 21-14-7 MG/24HR TD KIT: 1.0000 | PACK | Freq: Every day | TRANSDERMAL | 1 refills | Status: DC

## 2018-08-17 MED ORDER — TIOTROPIUM BROMIDE MONOHYDRATE 1.25 MCG/ACT IN AERS: 2.0000 | INHALATION_SPRAY | Freq: Every day | RESPIRATORY_TRACT | 2 refills | Status: DC

## 2018-08-17 NOTE — Addendum Note (Signed)
Addended by: Karmen Stabs on: 08/17/2018 02:23 PM   Modules accepted: Orders

## 2018-08-17 NOTE — Assessment & Plan Note (Addendum)
PFTs 07/20/2018 showed mild obstructive air disease. Moderate restriction, +BD response, mild diffusion defect  - Continue Symbicort 160 twice daily  - Add spiriva respimat 1.25 and Singulair at bedtime - Encourage smoking cessation, starter pack for nicotine patch - Encourage weight loss  - FU in 3 months with Dr. Melvyn Novas

## 2018-08-17 NOTE — Patient Instructions (Addendum)
Pulmonary function test showed some restriction and mild obstruction consistent with early COPD - likely due to smoking and weight   Continue to work on quitting smoking   Adding Singulair, take once daily at bedtime- this is to help with allergy symptoms and prevent asthma exacerbations (sent in RX)  Adding Spiriva, take 2 puffs daily (given sample, RX sent to pharmacy)  Continue Symbicort 160 - 2 puffs twice a day   Nicotine patch (RX sent)  Follow up in 3 months with Dr. Melvyn Novas Return sooner if symptoms or breathing worsens   Steps to Quit Smoking Smoking tobacco can be harmful to your health and can affect almost every organ in your body. Smoking puts you, and those around you, at risk for developing many serious chronic diseases. Quitting smoking is difficult, but it is one of the best things that you can do for your health. It is never too late to quit. What are the benefits of quitting smoking? When you quit smoking, you lower your risk of developing serious diseases and conditions, such as:  Lung cancer or lung disease, such as COPD.  Heart disease.  Stroke.  Heart attack.  Infertility.  Osteoporosis and bone fractures.  Additionally, symptoms such as coughing, wheezing, and shortness of breath may get better when you quit. You may also find that you get sick less often because your body is stronger at fighting off colds and infections. If you are pregnant, quitting smoking can help to reduce your chances of having a baby of low birth weight. How do I get ready to quit? When you decide to quit smoking, create a plan to make sure that you are successful. Before you quit:  Pick a date to quit. Set a date within the next two weeks to give you time to prepare.  Write down the reasons why you are quitting. Keep this list in places where you will see it often, such as on your bathroom mirror or in your car or wallet.  Identify the people, places, things, and activities that  make you want to smoke (triggers) and avoid them. Make sure to take these actions: ? Throw away all cigarettes at home, at work, and in your car. ? Throw away smoking accessories, such as Scientist, research (medical). ? Clean your car and make sure to empty the ashtray. ? Clean your home, including curtains and carpets.  Tell your family, friends, and coworkers that you are quitting. Support from your loved ones can make quitting easier.  Talk with your health care provider about your options for quitting smoking.  Find out what treatment options are covered by your health insurance.  What strategies can I use to quit smoking? Talk with your healthcare provider about different strategies to quit smoking. Some strategies include:  Quitting smoking altogether instead of gradually lessening how much you smoke over a period of time. Research shows that quitting "cold [place]" is more successful than gradually quitting.  Attending in-person counseling to help you build problem-solving skills. You are more likely to have success in quitting if you attend several counseling sessions. Even short sessions of 10 minutes can be effective.  Finding resources and support systems that can help you to quit smoking and remain smoke-free after you quit. These resources are most helpful when you use them often. They can include: ? Online chats with a Social worker. ? Telephone quitlines. ? Careers information officer. ? Support groups or group counseling. ? Text messaging programs. ? Mobile phone applications.  Taking medicines to help you quit smoking. (If you are pregnant or breastfeeding, talk with your health care provider first.) Some medicines contain nicotine and some do not. Both types of medicines help with cravings, but the medicines that include nicotine help to relieve withdrawal symptoms. Your health care provider may recommend: ? Nicotine patches, gum, or lozenges. ? Nicotine inhalers or  sprays. ? Non-nicotine medicine that is taken by mouth.  Talk with your health care provider about combining strategies, such as taking medicines while you are also receiving in-person counseling. Using these two strategies together makes you more likely to succeed in quitting than if you used either strategy on its own. If you are pregnant or breastfeeding, talk with your health care provider about finding counseling or other support strategies to quit smoking. Do not take medicine to help you quit smoking unless told to do so by your health care provider. What things can I do to make it easier to quit? Quitting smoking might feel overwhelming at first, but there is a lot that you can do to make it easier. Take these important actions:  Reach out to your family and friends and ask that they support and encourage you during this time. Call telephone quitlines, reach out to support groups, or work with a counselor for support.  Ask people who smoke to avoid smoking around you.  Avoid places that trigger you to smoke, such as bars, parties, or smoke-break areas at work.  Spend time around people who do not smoke.  Lessen stress in your life, because stress can be a smoking trigger for some people. To lessen stress, try: ? Exercising regularly. ? Deep-breathing exercises. ? Yoga. ? Meditating. ? Performing a body scan. This involves closing your eyes, scanning your body from head to toe, and noticing which parts of your body are particularly tense. Purposefully relax the muscles in those areas.  Download or purchase mobile phone or tablet apps (applications) that can help you stick to your quit plan by providing reminders, tips, and encouragement. There are many free apps, such as QuitGuide from the [employer] Office manager for Disease Control and Prevention). You can find other support for quitting smoking (smoking cessation) through smokefree.gov and other websites.  How will I feel when I quit  smoking? Within the first 24 hours of quitting smoking, you may start to feel some withdrawal symptoms. These symptoms are usually most noticeable 2-3 days after quitting, but they usually do not last beyond 2-3 weeks. Changes or symptoms that you might experience include:  Mood swings.  Restlessness, anxiety, or irritation.  Difficulty concentrating.  Dizziness.  Strong cravings for sugary foods in addition to nicotine.  Mild weight gain.  Constipation.  Nausea.  Coughing or a sore throat.  Changes in how your medicines work in your body.  A depressed mood.  Difficulty sleeping (insomnia).  After the first 2-3 weeks of quitting, you may start to notice more positive results, such as:  Improved sense of smell and taste.  Decreased coughing and sore throat.  Slower heart rate.  Lower blood pressure.  Clearer skin.  The ability to breathe more easily.  Fewer sick days.  Quitting smoking is very challenging for most people. Do not get discouraged if you are not successful the first time. Some people need to make many attempts to quit before they achieve long-term success. Do your best to stick to your quit plan, and talk with your health care provider if you have any questions or  concerns. This information is not intended to replace advice given to you by your health care provider. Make sure you discuss any questions you have with your health care provider. Document Released: 08/25/2001 Document Revised: 04/28/2016 Document Reviewed: 01/15/2015 Elsevier Interactive Patient Education  2018 Elsevier Inc.   Chronic Obstructive Pulmonary Disease Exacerbation Chronic obstructive pulmonary disease (COPD) is a common lung problem. In COPD, the flow of air from the lungs is limited. COPD exacerbations are times that breathing gets worse and you need extra treatment. Without treatment they can be life threatening. If they happen often, your lungs can become more damaged. If  your COPD gets worse, your doctor may treat you with:  Medicines.  Oxygen.  Different ways to clear your airway, such as using a mask.  Follow these instructions at home:  Do not smoke.  Avoid tobacco smoke and other things that bother your lungs.  If given, take your antibiotic medicine as told. Finish the medicine even if you start to feel better.  Only take medicines as told by your doctor.  Drink enough fluids to keep your pee (urine) clear or pale yellow (unless your doctor has told you not to).  Use a cool mist machine (vaporizer).  If you use oxygen or a machine that turns liquid medicine into a mist (nebulizer), continue to use them as told.  Keep up with shots (vaccinations) as told by your doctor.  Exercise regularly.  Eat healthy foods.  Keep all doctor visits as told. Get help right away if:  You are very short of breath and it gets worse.  You have trouble talking.  You have bad chest pain.  You have blood in your spit (sputum).  You have a fever.  You keep throwing up (vomiting).  You feel weak, or you pass out (faint).  You feel confused.  You keep getting worse. This information is not intended to replace advice given to you by your health care provider. Make sure you discuss any questions you have with your health care provider. Document Released: 08/20/2011 Document Revised: 02/06/2016 Document Reviewed: 05/05/2013 Elsevier Interactive Patient Education  2017 [employer].

## 2018-08-17 NOTE — Progress Notes (Signed)
@Patient  ID: Lori Bean, female    DOB: October 31, 1970, 47 y.o.   MRN: [id number]  Chief Complaint  Patient presents with  . Follow-up    SOB with exertion, cough with grayish mucus, chest tightness and wheezing    Referring provider: [name], PA  HPI: 47 year old female, current every day smoker (12 pack year history). PMH mild persistent asthma, OSA, CHF, lymphedema. Patient of Dr. Melvyn Novas, last seen on 07/04/18 for acute sinusitis symptoms treated with Omnicef x 10 days. If not better consider CT sinuses.   07/22/2018  Patient presents today for 2 week follow-up with PFTs. Completed Omnicef, states that it helped some. Still has a little bit of brown mucus coming up but not as thick as it was. Some cough, less shortness of breath. Taking Symbicort 160 twice daily, sometimes misses her dose. She did not bring in her medications today to go over. Continues to smoke 1/2 pack daily (prior 2 ppd).   08/17/2018 Patient presents for 1 month follow-up. Feels better but still experiencing shortness of breath on exertion, wheezing, chest tightness and chronic cough with occasional production. She is using her nebulizer twice a day which helps. PFTs in November showed mixed disease with mild obstruction and moderate restriction likely d/t body habitus. Encourage weight loss and smoking cessation, she is interested in Nicotine patches.   Testing reviewed:  07/20/2018 PFTs - FVC 1.77 (65%), FEV1 1.33 (60%), RATIO 75, DLCOcor 17.01 (78%)  07/20/2018  FENO 7, IgE  126  07/04/18 Spirometry- FVC 1.6 (59%), FEV1 1.1 (50%), ratio 69 07/01/18 Eos absolute - 0.3  06/2018 BNP - 16 10/18- CXR showed no acute cardiopulmonary disease  10/28/2017 Spirometry - FEV1 1.01 (45%)  Ratio 67  Allergies  Allergen Reactions  . Aspirin Nausea And Vomiting    Immunization History  Administered Date(s) Administered  . Influenza Split 06/07/2013, 06/15/2015  . Influenza,inj,Quad PF,6+ Mos 07/22/2017  .  Pneumococcal Polysaccharide-23 04/03/2014  . Rabies, IM 05/04/2016, 05/12/2016    Past Medical History:  Diagnosis Date  . Anginal pain ([place])    occ from asthma  . Anxiety   . Arthritis   . Asthma   . Bipolar affective disorder ([place])    Schizophrenia  . Bronchitis    hx of  . CHF (congestive heart failure) ([place])   . Depression   . GERD (gastroesophageal reflux disease)   . Heart murmur   . Hypertension    takes meds  . Lymphedema 06/07/2018  . Morbid (severe) obesity due to excess calories ([place])   . Neuropathy    left leg , "from back surgery"  . Overactive bladder   . Schizophrenia ([place])   . Sciatica   . Shortness of breath   . Sleep apnea   . Snoring disorder    Pt stated my boyfriend always wakes me up and tells me to breathe    Tobacco History: Social History   Tobacco Use  Smoking Status Current Every Day Smoker  . Packs/day: 0.50  . Years: 24.00  . Pack years: 12.00  . Types: Cigarettes  Smokeless Tobacco Never Used   Ready to quit: Not Answered Counseling given: Not Answered   Outpatient Medications Prior to Visit  Medication Sig Dispense Refill  . albuterol (PROAIR HFA) 108 (90 Base) MCG/ACT inhaler Inhale 2 puffs into the lungs every 4 (four) hours as needed for wheezing or shortness of breath.    [name] albuterol (PROVENTIL) (2.5 MG/3ML) 0.083% nebulizer solution Take  2.5 mg by nebulization every 4 (four) hours as needed for wheezing or shortness of breath.    [name] amitriptyline (ELAVIL) 25 MG tablet Take 25 mg by mouth at bedtime.    . ARIPiprazole (ABILIFY) 20 MG tablet Take 1 tablet (20 mg total) by mouth at bedtime. (Patient taking differently: Take 20 mg by mouth 2 (two) times daily. ) 30 tablet 0  . budesonide-formoterol (SYMBICORT) 160-4.5 MCG/ACT inhaler Inhale 2 puffs into the lungs 2 (two) times daily. 1 Inhaler 5  . cetirizine (ZYRTEC) 10 MG tablet Take 1 tablet (10 mg total) by mouth daily. 30 tablet 3  . DULoxetine (CYMBALTA) 30 MG capsule Take  30 mg by mouth 2 (two) times daily.    . famotidine (PEPCID) 20 MG tablet One at bedtime 30 tablet 11  . furosemide (LASIX) 80 MG tablet Take 1 tablet (80 mg total) by mouth 2 (two) times daily. 180 tablet 2  . LYRICA 150 MG capsule Take 150 mg by mouth 2 (two) times daily.  2  . meloxicam (MOBIC) 15 MG tablet Take 15 mg by mouth daily as needed for pain.  0  . omeprazole (PRILOSEC) 40 MG capsule Take 1 capsule (40 mg total) by mouth daily. 90 capsule 0  . phentermine (ADIPEX-P) 37.5 MG tablet Take 37.5 mg by mouth daily.  1  . potassium chloride SA (K-DUR,KLOR-CON) 20 MEQ tablet Take 20 mEq by mouth daily.    [name] Respiratory Therapy Supplies (FLUTTER) DEVI Use flutter device 3 times a day 1 each 0  . tiZANidine (ZANAFLEX) 4 MG tablet Take 4 mg by mouth every 8 (eight) hours as needed for spasms.  1  . traMADol (ULTRAM) 50 MG tablet Take 50 mg by mouth every 8 (eight) hours as needed for pain.  0  . triamterene-hydrochlorothiazide (MAXZIDE-25) 37.5-25 MG tablet Take 1 tablet by mouth daily.  5  . valsartan (DIOVAN) 320 MG tablet Take 1 tablet (320 mg total) by mouth daily. 30 tablet 5  . amLODipine (NORVASC) 10 MG tablet Take 1 tablet (10 mg total) by mouth daily. 90 tablet 2  . budesonide-formoterol (SYMBICORT) 160-4.5 MCG/ACT inhaler Inhale 2 puffs into the lungs 2 (two) times daily for 1 day. (Patient not taking: Reported on 07/01/2018) 1 Inhaler 0  . carvedilol (COREG) 25 MG tablet Take 1 tablet (25 mg total) by mouth 2 (two) times daily. 180 tablet 2   No facility-administered medications prior to visit.     Review of Systems  Review of Systems  Constitutional: Negative.   HENT: Negative.   Respiratory: Positive for cough, shortness of breath and wheezing.   Cardiovascular: Negative.     Physical Exam  BP 110/68 (BP Location: Left Arm, Cuff Size: Normal)   Pulse 86   Temp 98.3 F (36.8 C)   Ht 5\' 2"  (1.575 m)   Wt 253 lb 3.2 oz (114.9 kg)   SpO2 99%   BMI 46.31 kg/m    Physical Exam  Constitutional: She is oriented to person, place, and time. She appears well-developed and well-nourished.  Obese  HENT:  Head: Normocephalic and atraumatic.  Eyes: Pupils are equal, round, and reactive to light. EOM are normal.  Neck: Normal range of motion. Neck supple.  Cardiovascular: Normal rate, regular rhythm and normal heart sounds.  No murmur heard. Pulmonary/Chest: Effort normal and breath sounds normal. No respiratory distress. She has no wheezes.  Abdominal: Soft. Bowel sounds are normal. There is no tenderness.  Neurological: She is alert and  oriented to person, place, and time.  Skin: Skin is warm and dry. No rash noted. No erythema.  Psychiatric: She has a normal mood and affect. Her behavior is normal. Judgment normal.     Lab Results:  CBC    Component Value Date/Time   WBC 10.3 07/01/2018 0841   RBC 4.80 07/01/2018 0841   HGB 14.5 07/01/2018 0841   HCT 45.5 07/01/2018 0841   PLT 240 07/01/2018 0841   MCV 94.8 07/01/2018 0841   MCH 30.2 07/01/2018 0841   MCHC 31.9 07/01/2018 0841   RDW 16.0 (H) 07/01/2018 0841   LYMPHSABS 2.7 07/01/2018 0841   MONOABS 1.0 07/01/2018 0841   EOSABS 0.3 07/01/2018 0841   BASOSABS 0.0 07/01/2018 0841    BMET    Component Value Date/Time   NA 137 07/01/2018 0841   K 4.2 07/01/2018 0841   CL 107 07/01/2018 0841   CO2 24 07/01/2018 0841   GLUCOSE 113 (H) 07/01/2018 0841   BUN 14 07/01/2018 0841   CREATININE 0.73 07/01/2018 0841   CREATININE 0.62 10/09/2016 0857   CALCIUM 9.2 07/01/2018 0841   GFRNONAA >60 07/01/2018 0841   GFRNONAA 69 06/19/2015 1150   GFRAA >60 07/01/2018 0841   GFRAA 80 06/19/2015 1150    BNP    Component Value Date/Time   BNP 16.5 07/01/2018 0841   BNP 15.2 09/18/2016 1058    ProBNP    Component Value Date/Time   PROBNP 14.0 10/28/2017 1039    Imaging: No results found.   Assessment & Plan:   COPD mixed type ([place]) PFTs 07/20/2018 showed mild obstructive air  disease. Moderate restriction, +BD response, mild diffusion defect  - Continue Symbicort 160 twice daily  - Add spiriva respimat 1.25 and Singulair at bedtime - Encourage smoking cessation, starter pack for nicotine patch - Encourage weight loss  - FU in 3 months with Dr. Alethia Berthold, NP 08/17/2018

## 2018-08-22 ENCOUNTER — Telehealth: Payer: Self-pay | Admitting: Primary Care

## 2018-08-22 MED ORDER — TIOTROPIUM BROMIDE MONOHYDRATE 1.25 MCG/ACT IN AERS: 2.0000 | INHALATION_SPRAY | Freq: Every day | RESPIRATORY_TRACT | 0 refills | Status: DC

## 2018-08-22 NOTE — Telephone Encounter (Signed)
Called and spoke with patient she said the medication was for her copd and started with a S. Patient on Spiriva 1.25  Called [employer] and started a PA for patient.  Medication name and strength: Spiriva Respimat 1.25 PA approved/denied: approved Approval dates: 08/22/2018-08/17/2019 PA# is [id number]   Patient also has 2 samples up at the front for pick up.  Called pharmacy to let them know it was approved  Called patient and let her know pharmacy would be calling to let her know when to pick up medication.    Nothing further needed

## 2018-08-24 ENCOUNTER — Telehealth: Payer: Self-pay | Admitting: Primary Care

## 2018-08-24 NOTE — Telephone Encounter (Signed)
Called patient, unable to reach left message to give us a call back. 

## 2018-08-25 MED ORDER — NICOTINE 14 MG/24HR TD PT24: MEDICATED_PATCH | TRANSDERMAL | 0 refills | Status: DC

## 2018-08-25 MED ORDER — NICOTINE 7 MG/24HR TD PT24: 7.0000 mg | MEDICATED_PATCH | Freq: Every day | TRANSDERMAL | 0 refills | Status: DC

## 2018-08-25 MED ORDER — NICOTINE 21 MG/24HR TD PT24: MEDICATED_PATCH | TRANSDERMAL | 0 refills | Status: DC

## 2018-08-25 NOTE — Telephone Encounter (Signed)
Spoke with pt, she states she smokes a pack and a half a day. I advised her that I would send in a Rx to [facility] on [address]. Pt understood and nothing further I needed. Rx sent in.

## 2018-08-25 NOTE — Addendum Note (Signed)
Addended by: Jannette Spanner on: 08/25/2018 03:04 PM   Modules accepted: Orders

## 2018-08-25 NOTE — Telephone Encounter (Signed)
Patient states that the pharmacy is unable to do the nicotine patch kit.I have called the pharmacy they will need an order for each patch strength '21mg'$ ,'14mg'$ ,'7mg'$ . She states for the quanties are 14 or 28 this would be chosen by how much the patient smokes.    Beth please advise on what quainty you would like for the three strengths.

## 2018-08-25 NOTE — Telephone Encounter (Signed)
Can you cal and ask patient how many cigarettes she smokes a day.   If she smokes 6-10 cigarettes/day then apply 14mg  patch qd x 6 weeks, then apply 7mg  patch qd x 2 weeks  If > 10 cigarettes/day apply 21mg  patch qd x6 weeks, then apply 14mg  patch qd x 2 weeks; then 7mg  patch qd x 2 weeks   Thank you

## 2018-08-29 NOTE — Progress Notes (Signed)
Chart and office note reviewed in detail  > agree with a/p as outlined    

## 2018-09-20 ENCOUNTER — Emergency Department (HOSPITAL_COMMUNITY): Payer: Medicaid Other

## 2018-09-20 ENCOUNTER — Encounter (HOSPITAL_COMMUNITY): Payer: Self-pay | Admitting: Emergency Medicine

## 2018-09-20 ENCOUNTER — Emergency Department (HOSPITAL_COMMUNITY)
Admission: EM | Admit: 2018-09-20 | Discharge: 2018-09-20 | Disposition: A | Discharge status: Home / Self Care | Payer: Medicaid Other | Attending: Emergency Medicine | Admitting: Emergency Medicine

## 2018-09-20 DIAGNOSIS — R197 Diarrhea, unspecified: Secondary | ICD-10-CM

## 2018-09-20 DIAGNOSIS — J449 Chronic obstructive pulmonary disease, unspecified: Secondary | ICD-10-CM | POA: Insufficient documentation

## 2018-09-20 DIAGNOSIS — I509 Heart failure, unspecified: Secondary | ICD-10-CM | POA: Insufficient documentation

## 2018-09-20 DIAGNOSIS — R072 Precordial pain: Secondary | ICD-10-CM

## 2018-09-20 DIAGNOSIS — F17210 Nicotine dependence, cigarettes, uncomplicated: Secondary | ICD-10-CM | POA: Insufficient documentation

## 2018-09-20 DIAGNOSIS — G44209 Tension-type headache, unspecified, not intractable: Secondary | ICD-10-CM | POA: Diagnosis not present

## 2018-09-20 DIAGNOSIS — I110 Hypertensive heart disease with heart failure: Secondary | ICD-10-CM | POA: Diagnosis not present

## 2018-09-20 DIAGNOSIS — J4530 Mild persistent asthma, uncomplicated: Secondary | ICD-10-CM | POA: Insufficient documentation

## 2018-09-20 DIAGNOSIS — R51 Headache: Secondary | ICD-10-CM | POA: Diagnosis present

## 2018-09-20 LAB — CBC
HCT: 43.5 % (ref 36.0–46.0)
Hemoglobin: 13.8 g/dL (ref 12.0–15.0)
MCH: 30.3 pg (ref 26.0–34.0)
MCHC: 31.7 g/dL (ref 30.0–36.0)
MCV: 95.4 fL (ref 80.0–100.0)
Platelets: 190 10*3/uL (ref 150–400)
RBC: 4.56 MIL/uL (ref 3.87–5.11)
RDW: 14.3 % (ref 11.5–15.5)
WBC: 9.3 10*3/uL (ref 4.0–10.5)
nRBC: 0 % (ref 0.0–0.2)

## 2018-09-20 LAB — URINALYSIS, ROUTINE W REFLEX MICROSCOPIC
Bilirubin Urine: NEGATIVE
Glucose, UA: NEGATIVE mg/dL
Hgb urine dipstick: NEGATIVE
Ketones, ur: NEGATIVE mg/dL
Leukocytes, UA: NEGATIVE
Nitrite: NEGATIVE
Protein, ur: NEGATIVE mg/dL
Specific Gravity, Urine: 1.006 (ref 1.005–1.030)
pH: 5 (ref 5.0–8.0)

## 2018-09-20 LAB — COMPREHENSIVE METABOLIC PANEL
ALT: 23 U/L (ref 0–44)
AST: 12 U/L — ABNORMAL LOW (ref 15–41)
Albumin: 3.4 g/dL — ABNORMAL LOW (ref 3.5–5.0)
Alkaline Phosphatase: 69 U/L (ref 38–126)
Anion gap: 10 (ref 5–15)
BUN: 13 mg/dL (ref 6–20)
CO2: 25 mmol/L (ref 22–32)
Calcium: 9 mg/dL (ref 8.9–10.3)
Chloride: 105 mmol/L (ref 98–111)
Creatinine, Ser: 0.8 mg/dL (ref 0.44–1.00)
GFR calc Af Amer: >60 mL/min (ref >60)
GFR calc non Af Amer: >60 mL/min (ref >60)
Glucose, Bld: 102 mg/dL — ABNORMAL HIGH (ref 70–99)
Potassium: 3.9 mmol/L (ref 3.5–5.1)
Sodium: 140 mmol/L (ref 135–145)
Total Bilirubin: 0.5 mg/dL (ref 0.3–1.2)
Total Protein: 6.7 g/dL (ref 6.5–8.1)

## 2018-09-20 LAB — I-STAT BETA HCG BLOOD, ED (MC, WL, AP ONLY): I-stat hCG, quantitative: <5 m[IU]/mL (ref <5)

## 2018-09-20 LAB — TROPONIN I: Troponin I: <0.03 ng/mL (ref <0.03)

## 2018-09-20 LAB — BRAIN NATRIURETIC PEPTIDE: B Natriuretic Peptide: 32.9 pg/mL (ref 0.0–100.0)

## 2018-09-20 LAB — LIPASE, BLOOD: Lipase: 35 U/L (ref 11–51)

## 2018-09-20 LAB — SEDIMENTATION RATE: Sed Rate: 10 mm/hr (ref 0–22)

## 2018-09-20 MED ORDER — DEXAMETHASONE SODIUM PHOSPHATE 10 MG/ML IJ SOLN
10.0000 mg | Freq: Once | INTRAMUSCULAR | Status: AC
  Administered 2018-09-20: 10 mg via INTRAVENOUS
  Filled 2018-09-20: qty 1

## 2018-09-20 MED ORDER — SODIUM CHLORIDE 0.9 % IV BOLUS
500.0000 mL | Freq: Once | INTRAVENOUS | Status: AC
  Administered 2018-09-20: 500 mL via INTRAVENOUS

## 2018-09-20 MED ORDER — PROCHLORPERAZINE EDISYLATE 10 MG/2ML IJ SOLN
10.0000 mg | Freq: Once | INTRAMUSCULAR | Status: AC
  Administered 2018-09-20: 10 mg via INTRAVENOUS
  Filled 2018-09-20: qty 2

## 2018-09-20 MED ORDER — DIPHENHYDRAMINE HCL 50 MG/ML IJ SOLN
50.0000 mg | Freq: Once | INTRAMUSCULAR | Status: DC
  Filled 2018-09-20: qty 1

## 2018-09-20 MED ORDER — DIPHENHYDRAMINE HCL 50 MG/ML IJ SOLN
25.0000 mg | Freq: Once | INTRAMUSCULAR | Status: AC
  Administered 2018-09-20: 25 mg via INTRAVENOUS

## 2018-09-20 MED ORDER — KETOROLAC TROMETHAMINE 30 MG/ML IJ SOLN
30.0000 mg | Freq: Once | INTRAMUSCULAR | Status: AC
  Administered 2018-09-20: 30 mg via INTRAVENOUS
  Filled 2018-09-20: qty 1

## 2018-09-20 NOTE — ED Notes (Signed)
Patient transported to CT 

## 2018-09-20 NOTE — ED Notes (Signed)
This rn attempted iv access without success, other PIV infiltrated

## 2018-09-20 NOTE — ED Provider Notes (Signed)
Emergency Department Provider Note   I have reviewed the triage vital signs and the nursing notes.   HISTORY  Chief Complaint Diarrhea and Headache   HPI Lori Bean is a 48 y.o. female with PMH of CHF, Bipolar disorder, asthma, COPD, and OSA presents to the emergency department for evaluation of multiple medical complaints.  She states that primarily she is concerned about her headache which is been persistently painful for the past month.  She has sharp pains over the left temple and radiation of that pain to the back of the scalp.  States it hurts to brush or comb her hair.  She has had some associated blurry vision but denies diplopia.  No fevers or chills.  No neck pain or stiffness.  Symptoms seem to have worsened which prompted her ED visit primarily but she is also concerned regarding her diarrhea.  She notes several days of watery diarrhea that is very foul-smelling.  She is not having significant abdominal pain.  No recent hospitalization or antibiotic use.  She notes that last night she was having some chest discomfort and shortness of breath with lying flat.  She feels that her extremities are more swollen than normal.   Past Medical History:  Diagnosis Date  . Anginal pain ([place])    occ from asthma  . Anxiety   . Arthritis   . Asthma   . Bipolar affective disorder ([place])    Schizophrenia  . Bronchitis    hx of  . CHF (congestive heart failure) ([place])   . Depression   . GERD (gastroesophageal reflux disease)   . Heart murmur   . Hypertension    takes meds  . Lymphedema 06/07/2018  . Morbid (severe) obesity due to excess calories ([place])   . Neuropathy    left leg , "from back surgery"  . Overactive bladder   . Schizophrenia ([place])   . Sciatica   . Shortness of breath   . Sleep apnea   . Snoring disorder    Pt stated my boyfriend always wakes me up and tells me to breathe    Patient Active Problem List   Diagnosis Date Noted  . COPD mixed type ([place])  08/17/2018  . Sinusitis 07/22/2018  . Lymphedema 06/07/2018  . Abnormal uterine bleeding (AUB) 12/12/2017  . Heart murmur   . CHF (congestive heart failure) ([place])   . Chronic asthma, mild persistent, uncomplicated 82/42/3536  . Dyspnea on exertion 05/05/2017  . Asthma exacerbation in COPD ([place]) 05/05/2017  . Morbid obesity due to excess calories ([place]) 05/05/2017  . Asthma exacerbation 12/17/2016  . 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 Synovial plica of right knee 14/43/1540  . Tear of medial meniscus of right knee, initial encounter 09/28/2016  . OSA (obstructive sleep apnea) 09/23/2016  . Chronic diastolic heart failure ([place]) 04/22/2016  . Prediabetes 04/22/2016  . Cigarette smoker 04/03/2014  . Obesity (BMI 30-39.9) 04/03/2014  . Chest pain 04/03/2014  . Mild cardiomegaly 02/21/2014  . Lumbar spondylosis 12/28/2013  . Extrinsic asthma 06/07/2013  . Anxiety   . Essential hypertension   . Bipolar affective disorder ([place])   . GERD (gastroesophageal reflux disease)   . Arthritis   . Schizophrenia ([place])   . Overactive bladder     Past Surgical History:  Procedure Laterality Date  . BACK SURGERY     3 back surgeries  . CHOLECYSTECTOMY    . COLONOSCOPY WITH PROPOFOL N/A 01/23/2016   Procedure: COLONOSCOPY WITH PROPOFOL;  Surgeon:  Wonda Horner, MD;  Location: [facility];  Service: Endoscopy;  Laterality: N/A;  . EYE SURGERY     Metal plate in right eye. Had fracture in right eye  . gallstones reomved    . KNEE ARTHROSCOPY WITH MENISCAL REPAIR Right 09/28/2016   Procedure: KNEE ARTHROSCOPY WITH MENISCAL REPAIR;  Surgeon: Dorna Leitz, MD;  Location: [facility];  Service: Orthopedics;  Laterality: Right;  Right partial meniscectomy and chondroplasty, patellar/femoral joint and medial femoral condyle   . LUMBAR LAMINECTOMY/DECOMPRESSION MICRODISCECTOMY  04/01/2012   Procedure: LUMBAR LAMINECTOMY/DECOMPRESSION MICRODISCECTOMY 2 LEVELS;  Surgeon: Faythe Ghee, MD;  Location: [facility];  Service: Neurosurgery;  Laterality: Left;  Lumbar four-five, lumbar five sacral one microdiscectomy   . LUMBAR WOUND DEBRIDEMENT  04/29/2012   Procedure: LUMBAR WOUND DEBRIDEMENT;  Surgeon: Faythe Ghee, MD;  Location: [facility];  Service: Neurosurgery;  Laterality: N/A;  lumbar wound debridement  . ROTATOR CUFF REPAIR     Right shoulder  . TUBAL LIGATION      Allergies Aspirin  Family History  Problem Relation Age of Onset  . Diabetes Mother   . Hypertension Mother   . Diabetes Father   . Heart disease Paternal Aunt   . Cancer Paternal Aunt     Social History Social History   Tobacco Use  . Smoking status: Current Every Day Smoker    Packs/day: 0.50    Years: 24.00    Pack years: 12.00    Types: Cigarettes  . Smokeless tobacco: Never Used  Substance Use Topics  . Alcohol use: No    Alcohol/week: 0.0 standard drinks    Comment: quit Nov. 2014  . Drug use: No    Review of Systems  Constitutional: No fever/chills Eyes: Positive blurry vision.  ENT: No sore throat. Cardiovascular: Positive chest pain last night, none today.  Respiratory: Positive shortness of breath, worse with lying flat.  Gastrointestinal: No abdominal pain.  No nausea, no vomiting. Positive diarrhea.  No constipation. Genitourinary: Negative for dysuria. Musculoskeletal: Negative for back pain. Skin: Negative for rash. Neurological: Negative for focal weakness or numbness. Positive HA.   10-point ROS otherwise negative.  ____________________________________________   PHYSICAL EXAM:  VITAL SIGNS: ED Triage Vitals  Enc Vitals Group     BP 09/20/18 1606 (!) 154/88     Pulse Rate 09/20/18 1606 74     Resp 09/20/18 1606 18     Temp 09/20/18 1606 98 F (36.7 C)     Temp Source 09/20/18 1606 Oral     SpO2 09/20/18 1606 99 %     Pain Score 09/20/18 1609 10   Constitutional: Alert and oriented. Well appearing and in no acute distress. Eyes: Conjunctivae are normal. PERRL.   Head: Atraumatic. Tenderness over the left temporal scalp.  Nose: No congestion/rhinnorhea. Mouth/Throat: Mucous membranes are moist.  Neck: No stridor.  Cardiovascular: Normal rate, regular rhythm. Good peripheral circulation. Grossly normal heart sounds.   Respiratory: Normal respiratory effort. No retractions. Lungs CTAB. Gastrointestinal: Soft and non-tender to diffuse palpation. No distention.  Musculoskeletal: No lower extremity tenderness nor edema. No gross deformities of extremities. Neurologic: Normal speech and language. No gross focal neurologic deficits are appreciated.  Skin:  Skin is warm, dry and intact. No rash noted.  ____________________________________________   LABS (all labs ordered are listed, but only abnormal results are displayed)  Labs Reviewed  COMPREHENSIVE METABOLIC PANEL - Abnormal; Notable for the following components:      Result Value  Glucose, Bld 102 (*)    Albumin 3.4 (*)    AST 12 (*)    All other components within normal limits  URINALYSIS, ROUTINE W REFLEX MICROSCOPIC - Abnormal; Notable for the following components:   Color, Urine STRAW (*)    All other components within normal limits  C DIFFICILE QUICK SCREEN W PCR REFLEX  LIPASE, BLOOD  CBC  SEDIMENTATION RATE  BRAIN NATRIURETIC PEPTIDE  TROPONIN I  I-STAT BETA HCG BLOOD, ED ([facility], [facility], [facility] ONLY)   ____________________________________________  RADIOLOGY  Dg Chest 2 View  Result Date: 09/20/2018 CLINICAL DATA:  Central left chest pain for 3 days with shortness of breath. EXAM: CHEST - 2 VIEW COMPARISON:  PA and lateral chest 07/01/2018 and 05/06/2017. FINDINGS: Lungs are clear. Heart size is upper normal. No pneumothorax or pleural fluid. No acute or focal bony abnormality. Degenerative disease right shoulder noted. IMPRESSION: No acute disease. Electronically Signed   By: Inge Rise M.D.   On: 09/20/2018 17:43   Ct Head Wo Contrast  Result Date: 09/20/2018 CLINICAL DATA:   Headache for 1 week EXAM: CT HEAD WITHOUT CONTRAST TECHNIQUE: Contiguous axial images were obtained from the base of the skull through the vertex without intravenous contrast. COMPARISON:  MRI 07/07/2017, CT brain 07/07/2017 FINDINGS: Brain: No acute territorial infarction, hemorrhage, or intracranial mass. The ventricles are of normal size. Vascular: No hyperdense vessel or unexpected calcification. Skull: Normal. Negative for fracture or focal lesion. Sinuses/Orbits: Mild mucosal thickening in the right maxillary sinus. No acute abnormality. Other: None IMPRESSION: Negative non contrasted CT appearance of the brain Electronically Signed   By: Donavan Foil M.D.   On: 09/20/2018 18:28    ____________________________________________   PROCEDURES  Procedure(s) performed:   Procedures  None  ____________________________________________   INITIAL IMPRESSION / ASSESSMENT AND PLAN / ED COURSE  Pertinent labs & imaging results that were available during my care of the patient were reviewed by me and considered in my medical decision making (see chart for details).  Patient presents to the emergency department for evaluation of multiple medical complaints but primarily seems most concerned about her headache which is been ongoing for a month and worsening.  She does have some tenderness over her left temporal scalp and is complaining of some blurred vision.  There is no focal neurological deficit.  Patient's abdomen is diffusely soft and nontender.  My suspicion for acute colitis is low.  She is low risk for C. Difficile but will send sample if patient is having bowel movements in the emergency department.  Plan for IV fluids.  Patient also describing some dyspnea and chest pain that was last night and not recurring into today.  Believes she could have a chest x-ray along with screening troponin and BNP but doubt ACS or acute CHF.  Patient does not clinically volume overloaded.   CT imaging and CXR  reviewed with no acute findings. Labs unremarkable. Troponin and BNP along with ESR are pending. Care transferred to [facility], PA-C.  ____________________________________________  FINAL CLINICAL IMPRESSION(S) / ED DIAGNOSES  Final diagnoses:  Acute non intractable tension-type headache  Diarrhea, unspecified type  Precordial chest pain     MEDICATIONS GIVEN DURING THIS VISIT:  Medications  prochlorperazine (COMPAZINE) injection 10 mg (has no administration in time range)  dexamethasone (DECADRON) injection 10 mg (has no administration in time range)  diphenhydrAMINE (BENADRYL) injection 25 mg (has no administration in time range)  sodium chloride 0.9 % bolus 500 mL (0 mLs Intravenous  Stopped 09/20/18 1817)  ketorolac (TORADOL) 30 MG/ML injection 30 mg (30 mg Intravenous Given 09/20/18 1702)     Note:  This document was prepared using Dragon voice recognition software and may include unintentional dictation errors.  Nanda Quinton, MD Emergency Medicine    Long, Wonda Olds, MD 09/20/18 2042

## 2018-09-20 NOTE — Discharge Instructions (Signed)
Follow-up with your primary care physician for reevaluation of your symptoms.  Your work-up today was reassuring that you are not having a stroke, heart attack, or life-threatening headache.  Drink plenty water and get plenty of rest.  You can take Imodium as needed for diarrhea.  Eat a diet of bland foods that will not upset your stomach.  Return to the emergency department if any concerning signs or symptoms develop such as severe headaches, passing out, persistent vomiting, persistent chest pain or shortness of breath.

## 2018-09-20 NOTE — ED Provider Notes (Signed)
Received patient at signout from Dr. Joyce Gross.  Refer to provider note for full history and physical examination.  Briefly, patient is a 48 year old female with history of CHF, bipolar disorder, asthma, COPD, OSA presenting for evaluation of multiple complaints including headache for 1 month, chest pain and shortness of breath last night, and diarrhea.  No focal neurologic deficits on examination, head CT reassuring.  Chest x-ray with no acute cardiopulmonary disease and troponin obtained.  Awaiting ESR for rule out of temporal arteritis.  If within normal limits, patient stable to follow-up with her PCP or neurology on an outpatient basis.  If abnormal, will require neuro consultation for further evaluation.    MDM  ESR, BNP, troponin all within normal limits.  No concern for acute cardiopulmonary abnormality, CVA, or temporal arteritis.  On reevaluation patient is resting comfortably in no apparent distress.  She was given a migraine cocktail in the ED with significant improvement in her headache and she feels comfortable with discharge home.  Discussed advancing diet slowly and eating bland foods to avoid exacerbating her diarrhea.  Recommend follow-up with PCP for reevaluation of headaches and diarrhea.  Discussed strict ED return precautions. Pt verbalized understanding of and agreement with plan and is safe for discharge home at this time.        Lori Papa, PA-C 09/20/18 2302    Lori Fast, MD 09/21/18 [phone]

## 2018-09-20 NOTE — ED Notes (Addendum)
Patient transported to XR. 

## 2018-09-20 NOTE — ED Notes (Signed)
Pt ambulated to restroom with steady gait.

## 2018-09-20 NOTE — ED Triage Notes (Signed)
Pt reports headache x 1 week, and diarrhea x 1 week, states that he stool smells foul, also c/o chronic back pain.

## 2018-10-10 ENCOUNTER — Telehealth: Payer: Self-pay | Admitting: Pulmonary Disease

## 2018-10-10 NOTE — Telephone Encounter (Signed)
Left message for patient to call back  

## 2018-10-11 NOTE — Telephone Encounter (Signed)
Spoke with pt. States that she heard a commercial on the radio about an implant device that would take place of using a CPAP/BiPAP. She could not elaborate on what she heard on the commercial. Pt would like more information on this.  Dr. [name] - please advise. Thanks.

## 2018-10-11 NOTE — Telephone Encounter (Signed)
She is asking about the INSPIRE device. I saw her only once in 2018 - has anyone followed up on her OSA  ? - if not, she needs appt to come in to evaluate & we can discuss this device

## 2018-10-11 NOTE — Telephone Encounter (Signed)
Spoke with pt. She is aware of Dr. Bari Mantis response. Pt has been scheduled to see Dr. [name] on 10/14/2018 at 10:45am. Nothing further was needed.

## 2018-10-14 ENCOUNTER — Ambulatory Visit: Payer: Medicaid Other | Admitting: Pulmonary Disease

## 2018-10-14 ENCOUNTER — Encounter: Payer: Self-pay | Admitting: Pulmonary Disease

## 2018-10-14 DIAGNOSIS — F17210 Nicotine dependence, cigarettes, uncomplicated: Secondary | ICD-10-CM

## 2018-10-14 DIAGNOSIS — J449 Chronic obstructive pulmonary disease, unspecified: Secondary | ICD-10-CM

## 2018-10-14 DIAGNOSIS — G4733 Obstructive sleep apnea (adult) (pediatric): Secondary | ICD-10-CM | POA: Diagnosis not present

## 2018-10-14 NOTE — Patient Instructions (Signed)
Dryness could be related to medication or to high pressure on your machine.  Changed to auto BiPAP settings, minimum EPAP 10 cm, pressure support +3 cm, maximum IPAP 16 cm  Trial of air fit F 30 fullface mask.  Congratulations on cutting down smoking. You have to try and quit completely Okay to not take Spiriva if you are having a "good breathing " type day

## 2018-10-14 NOTE — Progress Notes (Signed)
   Subjective:    Patient ID: Lori Bean, female    DOB: 1970/11/10, 48 y.o.   MRN: [id number]  HPI  48 year old smoker for FU of COPD & obstructive sleep apnea. PMH -  hypertensive heart disease and chronic diastolic heart failure. She also has bipolar disorder and schizophrenia  Chief Complaint  Patient presents with  . Follow up    F/U on OSA. Wants to talk about the Inspire device for OSA. She is having a hard time with bipap mask.    She was initially on CPAP but after repeat titration study in 2018 was placed on bilevel.  She seemed to tolerate this well initially but lately does complain of excessive dryness.  She has settled down with a full facemask.  She would like to discuss alternatives  BiPAP download was reviewed which shows good control of events and 16/13 cm and reasonable compliance with mild leak at the start of the month that was corrected later on  We reviewed medications which shows Spiriva, amitriptyline, Lyrica and Cymbalta  Breathing is stable, she wonders if she needs to continue taking her breathing medications.  She has cut down on her cigarettes to about half pack per day Weight has increased 17 pounds in the last 2 years   Significant tests/ events reviewed  NPSG 06/2016  moderate OSA with AHI of 26/hour with lowest desaturation of 87%. This was corrected by CPAP of 14 cm with a small fullface mask.  07/2017 titration >> Bipap 16/12  07/20/2018 PFTs - FVC 1.77 (65%), FEV1 1.33 (60%), RATIO 75, DLCOcor 17.01 (78%)   07/04/18 Spirometry- FVC 1.6 (59%), FEV1 1.1 (50%), ratio 69  10/28/2017 Spirometry - FEV1 1.01 (45%) Ratio 67  Review of Systems neg for any significant sore throat, dysphagia, itching, sneezing, nasal congestion or excess/ purulent secretions, fever, chills, sweats, unintended wt loss, pleuritic or exertional cp, hempoptysis, orthopnea pnd or change in chronic leg swelling. Also denies presyncope, palpitations, heartburn,  abdominal pain, nausea, vomiting, diarrhea or change in bowel or urinary habits, dysuria,hematuria, rash, arthralgias, visual complaints, headache, numbness weakness or ataxia.     Objective:   Physical Exam  Gen. Pleasant, obese, in no distress ENT - no lesions, no post nasal drip Neck: No JVD, no thyromegaly, no carotid bruits Lungs: no use of accessory muscles, no dullness to percussion, decreased without rales or rhonchi  Cardiovascular: Rhythm regular, heart sounds  normal, no murmurs or gallops, no peripheral edema Musculoskeletal: No deformities, no cyanosis or clubbing , no tremors       Assessment & Plan:

## 2018-10-14 NOTE — Assessment & Plan Note (Signed)
She was not willing to set a quit date but seem to be willing to cut down more on her cigarettes

## 2018-10-14 NOTE — Assessment & Plan Note (Signed)
Smoking cessation again emphasized Pulmonary function testing was reviewed Okay to not take Spiriva if you are having a "good breathing " type day

## 2018-10-14 NOTE — Assessment & Plan Note (Addendum)
Dryness could be related to medication or to high pressure on your machine.  Changed to auto BiPAP settings, minimum EPAP 10 cm, pressure support +3 cm, maximum IPAP 16 cm We discussed INSP IRE device, she is not a good candidate currently due to her high BMI  Trial of air fit F 30 fullface mask.  Weight loss encouraged, compliance with goal of at least 4-6 hrs every night is the expectation. Advised against medications with sedative side effects Cautioned against driving when sleepy - understanding that sleepiness will vary on a day to day basis

## 2018-10-18 ENCOUNTER — Telehealth: Payer: Self-pay | Admitting: *Deleted

## 2018-10-18 NOTE — Telephone Encounter (Signed)
   [facility] Pre-operative Risk Assessment    Request for surgical clearance:  1. What type of surgery is being performed? BIOPSY OF TONGUE  IN THE OFFICE   2. When is this surgery scheduled? TBD    3. What type of clearance is required (medical clearance vs. Pharmacy clearance to hold med vs. Both)? MEDICAL   4. Are there any medications that need to be held prior to surgery and how long?   5. Practice name and name of physician performing surgery? [facility]    6. What is your office phone number? [phone]    7.   What is your office fax number? [phone]  8.   Anesthesia type (None, local, MAC, general) ? LOCAL ANESTHESIA

## 2018-10-20 NOTE — Telephone Encounter (Signed)
   Primary Cardiologist: [name], MD  Chart reviewed as part of pre-operative protocol coverage. Patient was contacted 10/20/2018 in reference to pre-operative risk assessment for pending surgery as outlined below.  Lori Bean was last seen on 06/07/18 by Dr. Oval Linsey.  Since that day, Lori Bean has done very well without chest pain or SOB.  Therefore, based on ACC/AHA guidelines, the patient would be at acceptable risk for the planned procedure without further cardiovascular testing.   I will route this recommendation to the requesting party via Epic fax function and remove from pre-op pool.  Please call with questions.  Cecilie Kicks, NP 10/20/2018, 5:11 PM

## 2018-10-26 IMAGING — CR DG CHEST 2V
2 series · 2 of 2 positions shown · non-contrast
Comparison: Radiographs April 16, 2016.

CLINICAL DATA: Shortness of breath.

EXAM:
CHEST  2 VIEW

[w chest pa]
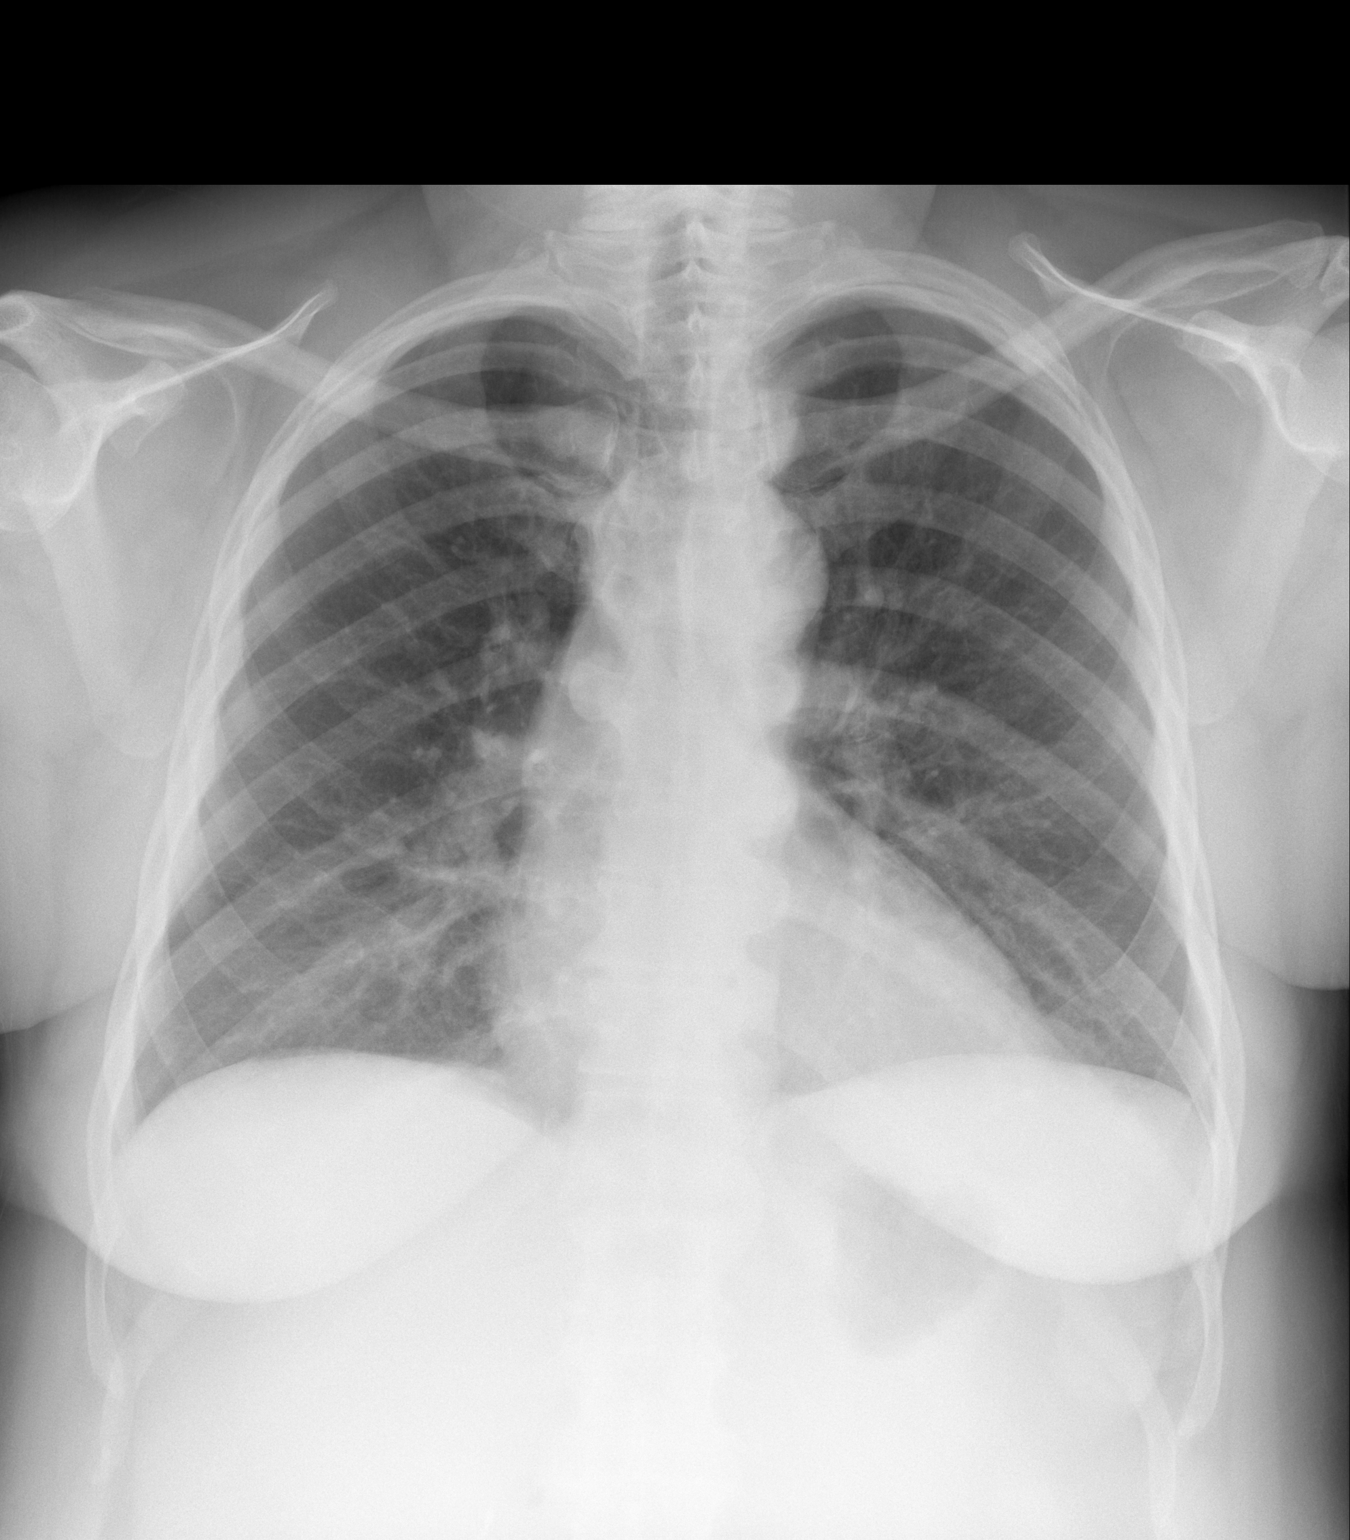

[w chest lat]
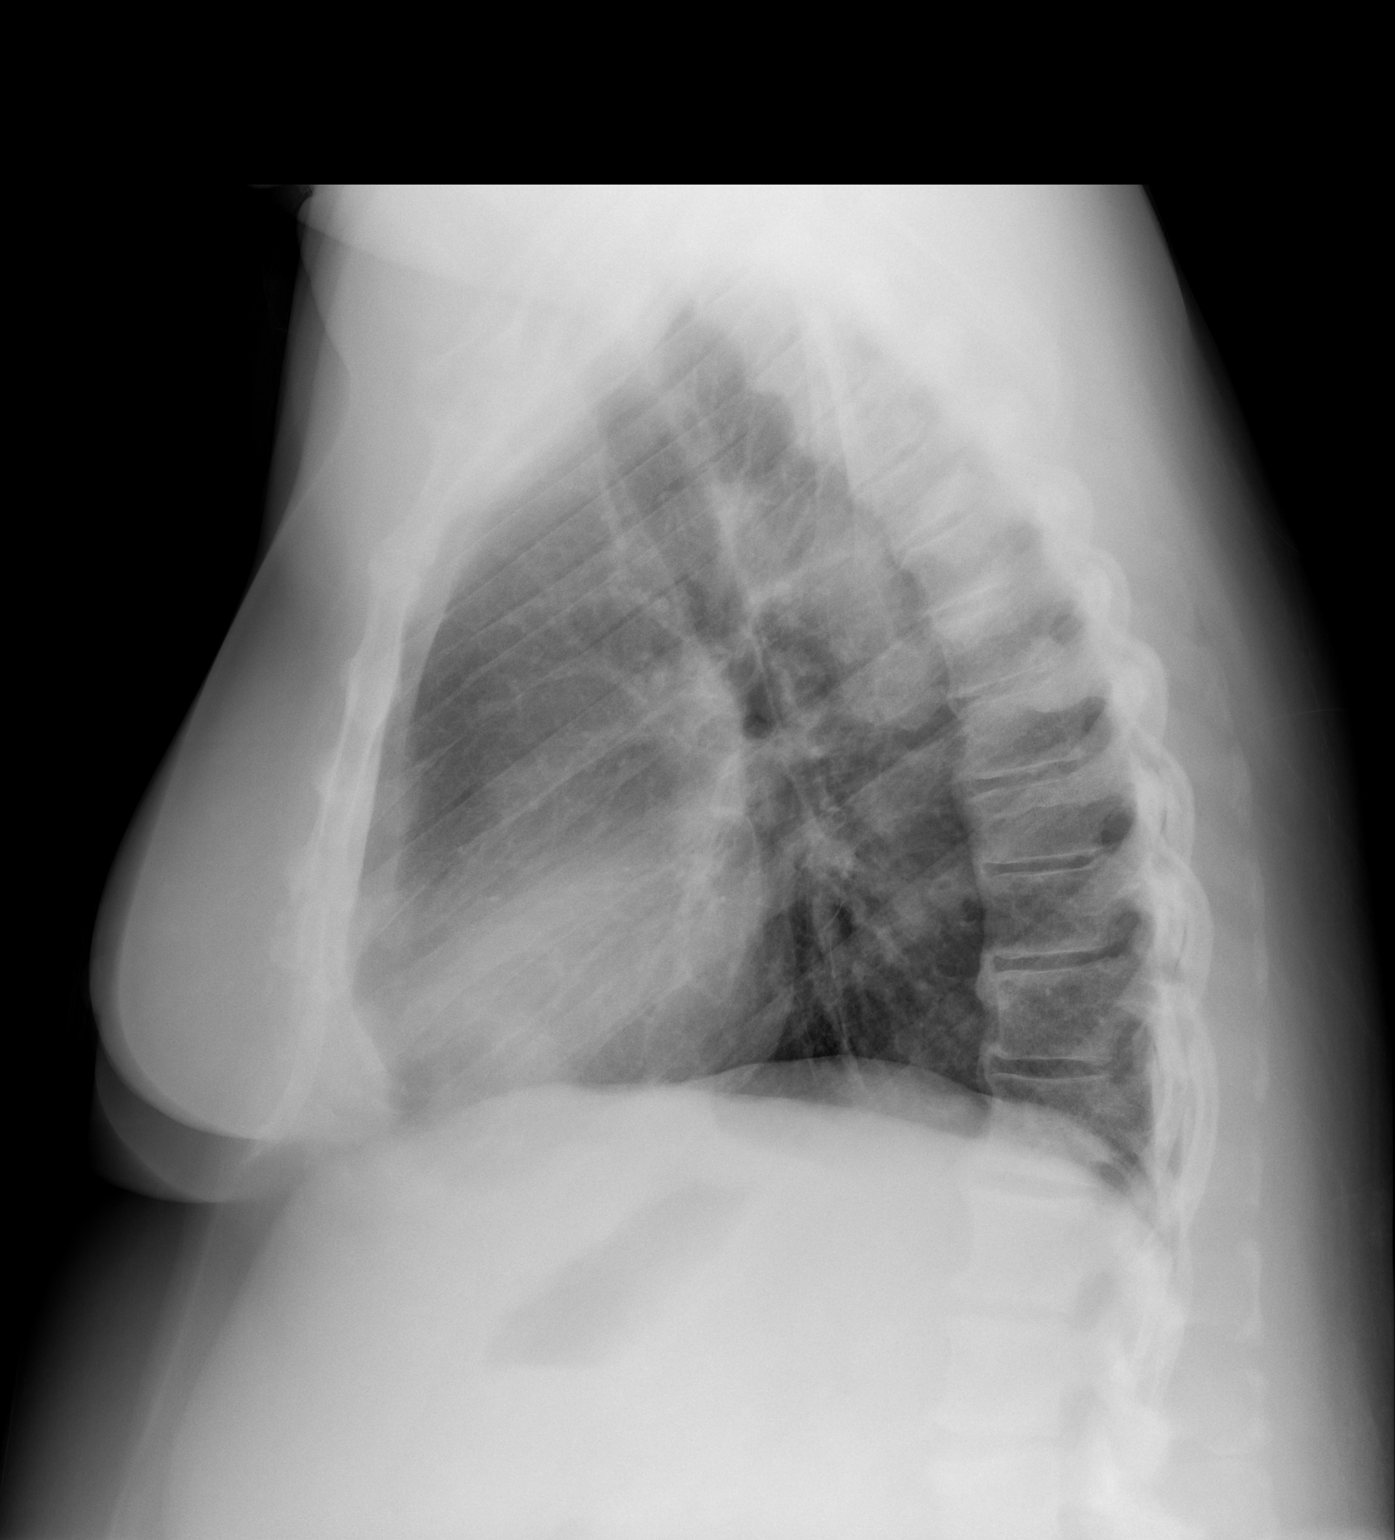

[2 of 2 positions shown; findings below may reference images not displayed]

FINDINGS: The heart size and mediastinal contours are within normal limits.
Both lungs are clear. No pneumothorax or pleural effusion is noted.
The visualized skeletal structures are unremarkable.
IMPRESSION: No active cardiopulmonary disease.

## 2018-11-14 ENCOUNTER — Ambulatory Visit: Payer: Medicaid Other | Admitting: Internal Medicine

## 2018-11-15 ENCOUNTER — Ambulatory Visit: Payer: Medicaid Other | Admitting: Internal Medicine

## 2018-11-21 ENCOUNTER — Encounter: Payer: Self-pay | Admitting: Internal Medicine

## 2018-11-21 ENCOUNTER — Ambulatory Visit (INDEPENDENT_AMBULATORY_CARE_PROVIDER_SITE_OTHER): Payer: Medicaid Other | Admitting: Internal Medicine

## 2018-11-21 VITALS — BP 130/70 | HR 85 | Ht 62.0 in | Wt 264.0 lb

## 2018-11-21 DIAGNOSIS — J4530 Mild persistent asthma, uncomplicated: Secondary | ICD-10-CM | POA: Diagnosis not present

## 2018-11-21 DIAGNOSIS — F17210 Nicotine dependence, cigarettes, uncomplicated: Secondary | ICD-10-CM | POA: Diagnosis not present

## 2018-11-21 MED ORDER — AZITHROMYCIN 250 MG PO TABS: ORAL_TABLET | ORAL | 0 refills | Status: DC

## 2018-11-21 MED ORDER — BUDESONIDE-FORMOTEROL FUMARATE 160-4.5 MCG/ACT IN AERO: 2.0000 | INHALATION_SPRAY | Freq: Two times a day (BID) | RESPIRATORY_TRACT | 0 refills | Status: DC

## 2018-11-21 NOTE — Patient Instructions (Addendum)
Zpak, No change in other medications  Work on inhaler technique:  relax and gently blow all the way out then take a nice smooth deep breath back in, triggering the inhaler at same time you start breathing in.  Hold for up to 5 seconds if you can. Blow out thru nose. Rinse and gargle with water when done     Only use your albuterol as a rescue medication to be used if you can't catch your breath by resting or doing a relaxed purse lip breathing pattern.  - The less you use it, the better it will work when you need it. - Ok to use up to 2 puffs  every 4 hours if you must but call for immediate appointment if use goes up over your usual need - Don't leave home without it !!  (think of it like the spare tire for your car)   The key is to stop smoking completely before smoking completely stops you! - For smoking cessation classes call [phone]       Please schedule a follow up visit in 3 months but call sooner if needed and bring your inhalers with you  Add:  May need trial off coreg and on bisoprolol

## 2018-11-21 NOTE — Progress Notes (Signed)
Subjective:     Patient ID: Lori Bean, female   DOB: [birth year]      MRN: [id number]    Brief patient profile:  48 yobf MO active smoker  healthy child/ 2 IUP's  With baseline wt < 200 with new doe x 2012 assoc with wt gain gain x 2016 referred to pulmonary clinic 06/04/2017 by Dr   Vista Lawman with dx mild asthma ? Early copd   Admit date: 12/17/2016 Discharge date: 12/18/2016  Discharge Diagnoses:  Possible asthma exacerbation Essential hypertension Chronic diastolic heart failure Prediabetes Morbid obesity  History of present illness:  48 y.o.femalewith medical history significant of Asthma and OSA (reversibility demonstrated on PFTs in Feb this year), smoking, HTN, CHF grade 2 diastolic dysfunction. Patient presents to the ED with c/o SOB. Symptoms onset at rest yesterday, also has 10lb weight gain over past 3 weeks but states she is compliant with lasix 80 BID. 2 albuterol nebs before seeing PCP this AM. PCP sent her to ED for evaluation.  Hospital Course:  Possible asthma exacerbation -Patient states that her wheezing has actually improved and she is feeling better today -She recently finished a course of prednisone back in February 2018 -She has been following up with the pulmonary clinic -Chest x-ray -Spoke with Dr. [name], patient's pulmonologist. Of note, progress note from Dr. [name] back in January 2018 stated that he was not convinced of her asthma. At this time, Dr. [name] recommended no change in patient's medications. Follow-up as an outpatient. -Continue Advair Diskus, pro-air, QVAR -Patient was placed on prednisone 40 mg, will continue this for 4 days.  Essential hypertension -Continue amlodipine -Lasix was discontinued as she does complain of chronic cough  Chronic diastolic heart failure -Echocardiogram 05/24/2015 showed EF of 7510%, grade 2 diastolic dysfunction -Currently appears euvolemic and compensated on exam. -BNP 24.8 -Continue home Lasix  dose with potassium supplementation  Prediabetes -Hemoglobin A1c was 6.1 back in 2016. This can be followed and managed as an outpatient by patient's primary care physician.  Morbid obesity -BMI 47.1 -Patient only talk to her primary care physician regarding weight loss and lifestyle modifications. -Patient states she feels that she has gained 10-15 pounds in the past several weeks. States she has been watching what she eats. Admits that she has been using different scales at different doctors offices. Inquires about whether there is a program that offers free scales. Case management stated that this program is no longer available.       06/04/2017 1st   office visit/ Curt Oatis  Re transition of care  Chief Complaint  Patient presents with  . Pulmonary Consult    Referred by Dr. Vista Lawman. Pt c/o DOE for approx 6 years, worse over the past wk to the point that she is winded just walking from room to room at home. She states also having some prod cough with large amounts of black colored sputum.  She has CP "stabbing pain" comes and goes and lasts for several minutes. She has a rash on her arm- not improving after seeing derm. She has also noticed blurred vision. She is using her proair inhaler 3-4 x per day.   first started on proair helped some, then symbicort sev months prior to OV>  Not much response, even neb now doesn't always help. 6 years prior to OV  Trouble flat and x 6 months sleeping upright due to sob immediately on lying back Doe progressive   But esp over the last week to room  to room now Coughing more since 2 months > black mucus assoc with cp with coughing  rec Plan A = Automatic = symbicort 80 Take 2 puffs first thing in am and then another 2 puffs about 12 hours later.  Pantoprazole (protonix) 40 mg (or prilosec 20 x 2)    Take  30-60 min before first meal of the day and Pepcid (famotidine)  20 mg one @  bedtime until return to office - this is the best way to tell whether  stomach acid is contributing to your problem.   Plan B = Backup Only use your albuterol as a rescue medication Plan C = Nebulizer  GERD diet   07/22/18 NP eval [name]  Continue on Symbicort Twice daily  . Rinse after use.  Work on not smoking    10/28/2017  f/u ov/Franki Alcaide re: sob/ ? How much is asthma / still smoking  Chief Complaint  Patient presents with  . Follow-up    Breathing has been worse for the past wk- SOB walking from room to room. She is also c/o wheezing and some chest tightness. She is using her albuterol inhaler 4-5 x per day on average.   Dyspnea: room to room x one week Cough: more than usual x one week thick clear/ assoc st and dysphagia ? On ppi ac ?  Sleep: ok on cpap s 02   Using saba q am instead of prn  Midline pain with coughing only  rec Plan A = Automatic = change symbicort to 160 Take 2 puffs first thing in am and then another 2 puffs about 12 hours later.  Work on inhaler technique:     Plan B = Backup Only use your albuterol as a rescue medication Plan C = Crisis - only use your albuterol nebulizer if you first try Plan B       03/04/2018  f/u ov/Nazario Russom re: AB on symbicort 160/ acei/ coreg 25 bid  Chief Complaint  Patient presents with  . Follow-up    still SOB, sore throat, difficulty swallowing, productive cough   Dyspnea:  Room to room Cough: is worse at hs / like choking /  [place] mucus/ nose stuffy now all x 2 months on acei gradually worse  Sleep: on cpap with some cough/gag  SABA use:  None rec Plan A = Automatic = change symbicort to 80 Take 2 puffs first thing in am and then another 2 puffs about 12 hours later.  Work on inhaler technique:   Plan B = Backup Only use your albuterol as a rescue medication Plan C = Crisis - only use your albuterol nebulizer if you first try Plan B stop lisinopril and take avapro (ibesartan) 300 mg one daily in its place  Augmentin 875 mg take one pill twice daily  X 10 days - Prednisone 10 mg take  4 each am  x 2 days,   2 each am x 2 days,  1 each am x 2 days and stop  Please schedule a follow up office visit in 4 weeks, sooner if needed to see [name] NP but bring all meds with you      NP ov 04/01/18 rec Increase Symbicort 160/4.50mg 2 puffs Twice daily  , rinse after use.  Mucinex DM Twice daily  As needed  Cough/congestion  Work on not smoking .  Continue on BIPAP At bedtime   Low salt diet , keep legs elevated.  Follow up with Cardiology as discussed.  07/01/18 ER eval for sob Dx  chf  rx lasix     07/04/2018  Extended transition of care  f/u ov/Melisssa Donner post er eval  re: copd/ab still smoking/ over using saba, did not use symb yet today and hfa poor / did not bring meds Chief Complaint  Patient presents with  . Follow-up    chronic asthma, patient uses albuterol TID  Dyspnea:  MMRC2 = can't walk a nl pace on a flat grade s sob but does fine slow and flat can shop HT/ [employer]  Cough: worse x sev weeks, better while on zpak worse off/ brown mucus 24/7 / assoc with overt HB Sleeping: on cpap, not bipap, bothered by cough / has not had any sleep f/u by [name] in years  SABA use: as above  rec Plan A = Automatic = symbicort 160 Take 2 puffs first thing in am and then another 2 puffs about 12 hours later.  Work on inhaler technique:    Plan B = Backup Only use your albuterol as a rescue medication  07/20/18  NP eval Start Zyrtec- take daily during allergy season or until symptoms improve  Start Mucinex twice daily for congestion  Flutter valve three times a day for congestion  Continue Symbicort 2 puffs twice a day (every day) Back up- Albuterol rescue inhaler 2 puffs every 4-6 hours as needed only for sob/wheezing Lab today- checking IgE    08/17/18 NP rec Pulmonary function test showed some restriction and mild obstruction consistent with early COPD - likely due to smoking and weight  Continue to work on quitting smoking  Adding Singulair, take once daily at bedtime- this is to  help with allergy symptoms and prevent asthma exacerbations (sent in RX) Adding Spiriva, take 2 puffs daily (given sample, RX sent to pharmacy) Continue Symbicort 160 - 2 puffs twice a day  Nicotine patch (RX sent)      11/21/2018  f/u ov/Christine Morton re: copd/ ab  Still smoking symb/spiriva  Chief Complaint  Patient presents with  . Follow-up    feeling about the same - cough (prod-brownish)    Dyspnea:  Room to room / does [employer] once week able to do an aisle leaning on cart = MMRC3 = can't walk 100 yards even at a slow pace at a flat grade s stopping due to sob   Cough: worse than usual x 2 weeks  When lies down with slt brown color, thicker than usual  Sleeping: on back 3 pillows and cpap  SABA use: not very helpful though hfa poor  02: none   No obvious day to day or daytime variability or assoc   mucus plugs or hemoptysis or cp or chest tightness, subjective wheeze or overt sinus or hb symptoms.     Also denies any obvious fluctuation of symptoms with weather or environmental changes or other aggravating or alleviating factors except as outlined above   No unusual exposure hx or h/o childhood pna/ asthma or knowledge of premature birth.  Current Allergies, Complete Past Medical History, Past Surgical History, Family History, and Social History were reviewed in [employer] record.  ROS  The following are not active complaints unless bolded Hoarseness, sore throat, dysphagia, dental problems, itching, sneezing,  nasal congestion or discharge of excess mucus or purulent secretions, ear ache,   fever, chills, sweats, unintended wt loss or wt gain, classically pleuritic or exertional cp,  orthopnea pnd or arm/hand swelling  or leg swelling, presyncope,  palpitations, abdominal pain, anorexia, nausea, vomiting, diarrhea  or change in bowel habits or change in bladder habits, change in stools or change in urine, dysuria, hematuria,  rash, arthralgias, visual complaints,  headache, numbness, weakness or ataxia or problems with walking or coordination,  change in mood or  memory.        Current Meds - did not bring meds/  - NOTE:   Unable to verify as accurately reflecting what pt takes     Medication Sig  . albuterol (PROAIR HFA) 108 (90 Base) MCG/ACT inhaler Inhale 2 puffs into the lungs every 4 (four) hours as needed for wheezing or shortness of breath.  [name] albuterol (PROVENTIL) (2.5 MG/3ML) 0.083% nebulizer solution Take 2.5 mg by nebulization every 4 (four) hours as needed for wheezing or shortness of breath.  [name] amitriptyline (ELAVIL) 25 MG tablet Take 25 mg by mouth at bedtime.  . ARIPiprazole (ABILIFY) 20 MG tablet Take 1 tablet (20 mg total) by mouth at bedtime. (Patient taking differently: Take 20 mg by mouth 2 (two) times daily. )  . budesonide-formoterol (SYMBICORT) 160-4.5 MCG/ACT inhaler Inhale 2 puffs into the lungs 2 (two) times daily.  . cetirizine (ZYRTEC) 10 MG tablet Take 1 tablet (10 mg total) by mouth daily.  . DULoxetine (CYMBALTA) 30 MG capsule Take 30 mg by mouth 2 (two) times daily.  . famotidine (PEPCID) 20 MG tablet One at bedtime  . furosemide (LASIX) 80 MG tablet Take 1 tablet (80 mg total) by mouth 2 (two) times daily.  [name] LYRICA 150 MG capsule Take 150 mg by mouth 2 (two) times daily.  . meloxicam (MOBIC) 15 MG tablet Take 15 mg by mouth daily as needed for pain.  . montelukast (SINGULAIR) 10 MG tablet Take 1 tablet (10 mg total) by mouth at bedtime.  . nicotine (NICODERM CQ - DOSED IN MG/24 HOURS) 14 mg/24hr patch After completing 21 mg dose for 6 weeks, taper down and place one patch (14 mg) onto skin daily for 2 weeks  . nicotine (NICODERM CQ - DOSED IN MG/24 HOURS) 21 mg/24hr patch Start taper and place one patch onto skin daily for 6 weeks.  . nicotine (NICODERM CQ - DOSED IN MG/24 HR) 7 mg/24hr patch Place 1 patch (7 mg total) onto the skin daily. After completing 14 mg dose for 2 weeks, place one patch onto skin (7 mg) daily for  2 weeks. Then stop  . omeprazole (PRILOSEC) 40 MG capsule Take 1 capsule (40 mg total) by mouth daily.  . potassium chloride SA (K-DUR,KLOR-CON) 20 MEQ tablet Take 20 mEq by mouth daily.  [name] Respiratory Therapy Supplies (FLUTTER) DEVI Use flutter device 3 times a day  . Tiotropium Bromide Monohydrate (SPIRIVA RESPIMAT) 1.25 MCG/ACT AERS Inhale 2 puffs into the lungs daily.  [name] tiZANidine (ZANAFLEX) 4 MG tablet Take 4 mg by mouth every 8 (eight) hours as needed for spasms.  [name] triamterene-hydrochlorothiazide (MAXZIDE-25) 37.5-25 MG tablet Take 1 tablet by mouth daily.  . valsartan (DIOVAN) 320 MG tablet Take 1 tablet (320 mg total) by mouth daily.                  Objective:   Physical Exam    amb obese bf nad    11/21/2018          264 07/04/2018      245  03/04/2018       240  10/28/2017       252   06/04/17 250 lb (113.4  kg)  05/14/17 250 lb 12.8 oz (113.8 kg)  05/06/17 242 lb (109.8 kg)    Vital signs reviewed - Note on arrival 02 sats  100% on RA     HEENT: nl dentition, turbinates bilaterally, and oropharynx. Nl external ear canals without cough reflex   NECK :  without JVD/Nodes/TM/ nl carotid upstrokes bilaterally   LUNGS: no acc muscle use,  Nl contour chest with minimal insp/exp rhonchi  bilaterally without cough on insp or exp maneuvers   CV:  RRR  no s3 or murmur or increase in P2, and severe lymphedema changes all 4 ext worse in legs   ABD:  soft and nontender with nl inspiratory excursion in the supine position. No bruits or organomegaly appreciated, bowel sounds nl  MS:  Nl gait/ ext warm without deformities, calf tenderness, cyanosis or clubbing No obvious joint restrictions   SKIN: warm and dry without lesions    NEURO:  alert, approp, nl sensorium with  no motor or cerebellar deficits apparent.           I personally reviewed images and agree with radiology impression as follows:  CXR:   09/20/2018 No acute disease.      Assessment:

## 2018-11-26 IMAGING — CR DG CHEST 2V
2 series · 2 of 2 positions shown · non-contrast
Comparison: Chest radiograph 09/21/2016.

CLINICAL DATA: Patient with shortness of breath for multiple days.
Mid sternal chest pain.

EXAM:
CHEST  2 VIEW

[chest pa]
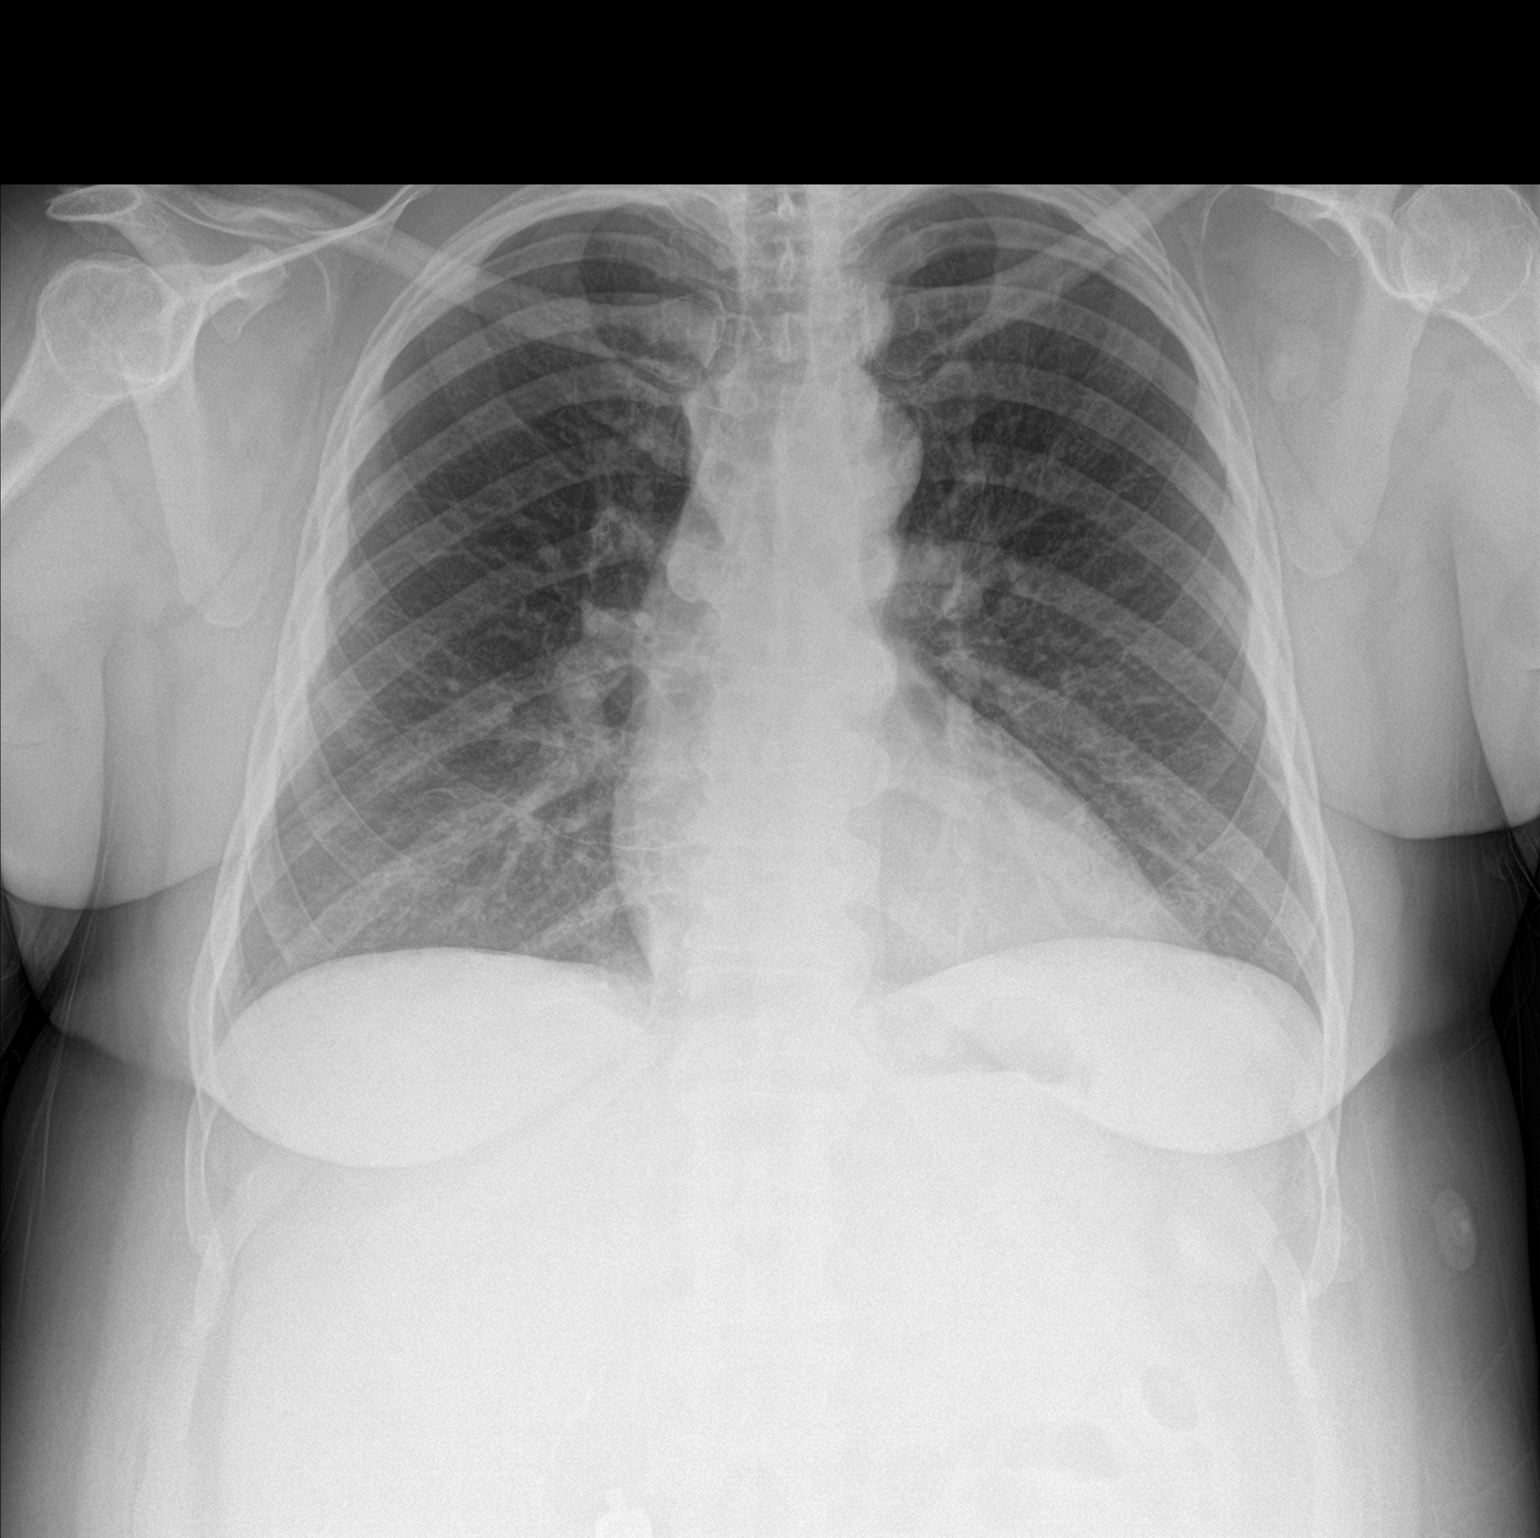

[chest lat]
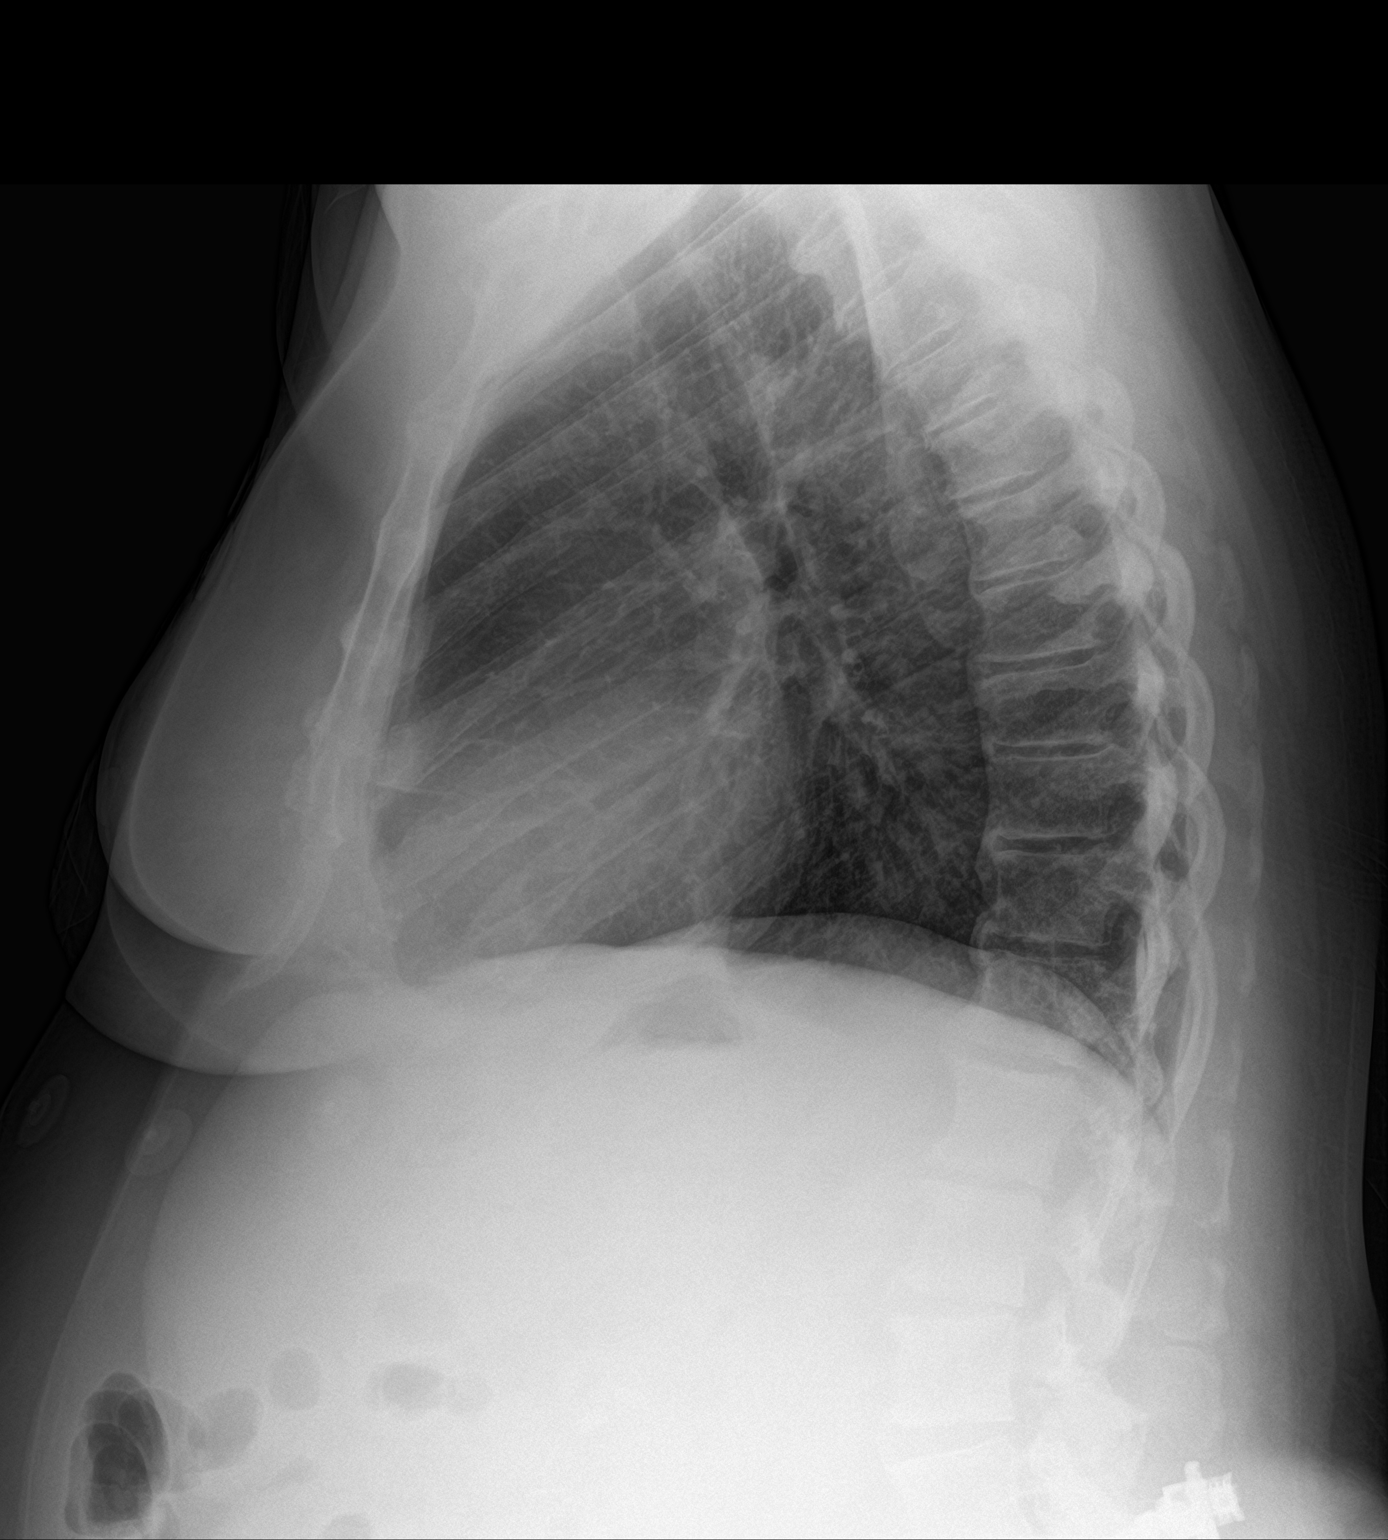

[2 of 2 positions shown; findings below may reference images not displayed]

FINDINGS: Normal cardiac and mediastinal contours. No consolidative pulmonary
opacities. No pleural effusion or pneumothorax. Thoracic spine
degenerative changes.
IMPRESSION: No active cardiopulmonary disease.

## 2018-11-28 ENCOUNTER — Encounter: Payer: Self-pay | Admitting: Internal Medicine

## 2018-11-28 NOTE — Assessment & Plan Note (Signed)
4-5 min discussion re active cigarette smoking in addition to office E&M  Ask about tobacco use:   ongoing Advise quitting   I emphasized that although we never turn away smokers from the pulmonary clinic, we do ask that they understand that the recommendations that we make  won't work nearly as well in the presence of continued cigarette exposure. In fact, we may very well  reach a point where we can't promise to help the patient if he/she can't quit smoking. (We can and will promise to try to help, we just can't promise what we recommend will really work)  Assess willingness:  Not committed at this point Assist in quit attempt:  Per PCP when ready Arrange follow up:   Follow up per Primary Care planned  For smoking cessation classes call [phone]        I had an extended discussion with the patient reviewing all relevant studies completed to date and  lasting 15 to 20 minutes of a 25 minute visit    See device teaching which extended face to face time for this visit.  Each maintenance medication was reviewed in detail including emphasizing most importantly the difference between maintenance and prns and under what circumstances the prns are to be triggered using an action plan format that is not reflected in the computer generated alphabetically organized AVS which I have not found useful in most complex patients, especially with respiratory illnesses  Please see AVS for specific instructions unique to this visit that I personally wrote and verbalized to the the pt in detail and then reviewed with pt  by my nurse highlighting any  changes in therapy recommended at today's visit to their plan of care.        

## 2018-11-28 NOTE — Assessment & Plan Note (Addendum)
Onset of symptoms 2012 PFT's  11/10/2016  FEV1 2.06 (92 % ) ratio 81   p 24 % improvement from saba p ? prior to study with DLCO  93/94 % corrects to 102  % for alv volume   - 06/04/2017  After extensive coaching HFA effectiveness =    75% > try symbicort 80 2bid  - Spirometry 10/28/2017  FEV1 1.01 (45%)  Ratio 67  p am symb/saba/smoking  - 10/28/2017   try symb 160 2bid  - 03/04/2018  A try symb 80 2bid due to uacs component  - 04/01/18 try sym 160 2bid - Spirometry 07/04/2018  FEV1 1.1 (50%)  Ratio 69  Prior to rx - -  PFT's  07/20/18   FEV1 1.33 (60 % ) ratio 75  p 15 % improvement from saba p nothing prior to study with DLCO  81/78c % corrects to 104 % for alv volume  With ERV 6 and min curvature to f/v loop  And mild air trapping   - 11/21/2018  After extensive coaching inhaler device,  effectiveness =    75% (short Ti)   DDX of  difficult airways management almost all start with A and  include Adherence, Ace Inhibitors, Acid Reflux, Active Sinus Disease, Alpha 1 Antitripsin deficiency, Anxiety masquerading as Airways dz,  ABPA,  Allergy(esp in young), Aspiration (esp in elderly), Adverse effects of meds,  Active smoking or vaping, A bunch of PE's (a small clot burden can't cause this syndrome unless there is already severe underlying pulm or vascular dz with poor reserve) plus two Bs  = Bronchiectasis and Beta blocker use..and one C= CHF   Adherence is always the initial "prime suspect" and is a multilayered concern that requires a "trust but verify" approach in every patient - starting with knowing how to use medications, especially inhalers, correctly, keeping up with refills and understanding the fundamental difference between maintenance and prns vs those medications only taken for a very short course and then stopped and not refilled.  - see hfa teachng - return with all meds in hand using a trust but verify approach to confirm accurate Medication  Reconciliation The principal here is that until  we are certain that the  patients are doing what we've asked, it makes no sense to ask them to do more.    Active smoking also at top of list of usual suspects, see sep a/p   ? Active sinus/rhinitis/ tracheobronchitis > doubt much active infection here > zpak  ? Allergy > continue singulair, high dose ics  ? Anxiety > usually at the bottom of this list of usual suspects but should be much higher on this pt's based on H and P and note already on psychotropics and may interfere with adherence and also interpretation of response or lack thereof to symptom management which can be quite subjective.   ? Bblocker use:  In the setting of respiratory symptoms of unknown etiology,  It would be preferable to use bystolic, the most beta -1  selective Beta blocker available in sample form, with bisoprolol the most selective generic choice  on the market, at least on a trial basis, to make sure the spillover Beta 2 effects of the less specific Beta blockers are not contributing to this patient's symptoms.   ? chf >  bnp nl 09/20/18 during flare

## 2018-11-28 NOTE — Assessment & Plan Note (Signed)
4-5 min discussion re active cigarette smoking in addition to office E&M  Ask about tobacco use:   active Advise quitting   I took an extended  opportunity with this patient to outline the consequences of continued cigarette use  in airway disorders based on all the data we have from the multiple national lung health studies (perfomed over decades at millions of dollars in cost)  indicating that smoking cessation, not choice of inhalers or physicians, is the most important aspect of care.   Assess willingness:  Not committed at this point Assist in quit attempt:  Per PCP when ready Arrange follow up:   Follow up per Primary Care planned

## 2018-11-28 NOTE — Assessment & Plan Note (Signed)
Body mass index is 48.29 kg/m.  -  Trending up  Lab Results  Component Value Date   TSH 0.72 10/28/2017     Contributing to gerd risk/ doe/reviewed the need and the process to achieve and maintain neg calorie balance > defer f/u primary care including intermittently monitoring thyroid status

## 2018-12-08 ENCOUNTER — Emergency Department (HOSPITAL_COMMUNITY): Payer: Medicaid Other

## 2018-12-08 ENCOUNTER — Other Ambulatory Visit: Payer: Self-pay

## 2018-12-08 ENCOUNTER — Encounter (HOSPITAL_COMMUNITY): Payer: Self-pay | Admitting: Emergency Medicine

## 2018-12-08 ENCOUNTER — Emergency Department (HOSPITAL_COMMUNITY)
Admission: EM | Admit: 2018-12-08 | Discharge: 2018-12-08 | Disposition: A | Discharge status: Home / Self Care | Payer: Medicaid Other | Attending: Emergency Medicine | Admitting: Emergency Medicine

## 2018-12-08 DIAGNOSIS — Z79899 Other long term (current) drug therapy: Secondary | ICD-10-CM | POA: Insufficient documentation

## 2018-12-08 DIAGNOSIS — I5032 Chronic diastolic (congestive) heart failure: Secondary | ICD-10-CM | POA: Diagnosis not present

## 2018-12-08 DIAGNOSIS — J4541 Moderate persistent asthma with (acute) exacerbation: Secondary | ICD-10-CM

## 2018-12-08 DIAGNOSIS — F17210 Nicotine dependence, cigarettes, uncomplicated: Secondary | ICD-10-CM | POA: Insufficient documentation

## 2018-12-08 DIAGNOSIS — I110 Hypertensive heart disease with heart failure: Secondary | ICD-10-CM | POA: Insufficient documentation

## 2018-12-08 DIAGNOSIS — R609 Edema, unspecified: Secondary | ICD-10-CM

## 2018-12-08 DIAGNOSIS — R0602 Shortness of breath: Secondary | ICD-10-CM | POA: Diagnosis present

## 2018-12-08 LAB — COMPREHENSIVE METABOLIC PANEL
ALT: 28 U/L (ref 0–44)
AST: 25 U/L (ref 15–41)
Albumin: 3.6 g/dL (ref 3.5–5.0)
Alkaline Phosphatase: 79 U/L (ref 38–126)
Anion gap: 10 (ref 5–15)
BUN: 11 mg/dL (ref 6–20)
CO2: 26 mmol/L (ref 22–32)
Calcium: 9.3 mg/dL (ref 8.9–10.3)
Chloride: 101 mmol/L (ref 98–111)
Creatinine, Ser: 0.73 mg/dL (ref 0.44–1.00)
GFR calc Af Amer: >60 mL/min (ref >60)
GFR calc non Af Amer: >60 mL/min (ref >60)
Glucose, Bld: 112 mg/dL — ABNORMAL HIGH (ref 70–99)
Potassium: 5 mmol/L (ref 3.5–5.1)
Sodium: 137 mmol/L (ref 135–145)
Total Bilirubin: 1.5 mg/dL — ABNORMAL HIGH (ref 0.3–1.2)
Total Protein: 7 g/dL (ref 6.5–8.1)

## 2018-12-08 LAB — CBC WITH DIFFERENTIAL/PLATELET
Abs Immature Granulocytes: 0.03 10*3/uL (ref 0.00–0.07)
Basophils Absolute: 0 10*3/uL (ref 0.0–0.1)
Basophils Relative: 0 %
Eosinophils Absolute: 0.2 10*3/uL (ref 0.0–0.5)
Eosinophils Relative: 3 %
HCT: 44.3 % (ref 36.0–46.0)
Hemoglobin: 14.9 g/dL (ref 12.0–15.0)
Immature Granulocytes: 0 %
Lymphocytes Relative: 26 %
Lymphs Abs: 2.2 10*3/uL (ref 0.7–4.0)
MCH: 32.1 pg (ref 26.0–34.0)
MCHC: 33.6 g/dL (ref 30.0–36.0)
MCV: 95.5 fL (ref 80.0–100.0)
Monocytes Absolute: 1.3 10*3/uL — ABNORMAL HIGH (ref 0.1–1.0)
Monocytes Relative: 15 %
Neutro Abs: 4.5 10*3/uL (ref 1.7–7.7)
Neutrophils Relative %: 56 %
Platelets: 226 10*3/uL (ref 150–400)
RBC: 4.64 MIL/uL (ref 3.87–5.11)
RDW: 15.9 % — ABNORMAL HIGH (ref 11.5–15.5)
WBC: 8.2 10*3/uL (ref 4.0–10.5)
nRBC: 0 % (ref 0.0–0.2)

## 2018-12-08 LAB — TROPONIN I: Troponin I: <0.03 ng/mL (ref <0.03)

## 2018-12-08 LAB — BRAIN NATRIURETIC PEPTIDE: B Natriuretic Peptide: 11.9 pg/mL (ref 0.0–100.0)

## 2018-12-08 MED ORDER — ALBUTEROL SULFATE HFA 108 (90 BASE) MCG/ACT IN AERS
4.0000 | INHALATION_SPRAY | Freq: Once | RESPIRATORY_TRACT | Status: AC
  Administered 2018-12-08: 4 via RESPIRATORY_TRACT

## 2018-12-08 MED ORDER — BENZONATATE 100 MG PO CAPS
100.0000 mg | ORAL_CAPSULE | Freq: Once | ORAL | Status: AC
  Administered 2018-12-08: 100 mg via ORAL
  Filled 2018-12-08: qty 1

## 2018-12-08 MED ORDER — FUROSEMIDE 10 MG/ML IJ SOLN
80.0000 mg | Freq: Once | INTRAMUSCULAR | Status: AC
  Administered 2018-12-08: 80 mg via INTRAVENOUS
  Filled 2018-12-08: qty 8

## 2018-12-08 MED ORDER — METHYLPREDNISOLONE SODIUM SUCC 125 MG IJ SOLR
125.0000 mg | Freq: Once | INTRAMUSCULAR | Status: AC
  Administered 2018-12-08: 125 mg via INTRAVENOUS
  Filled 2018-12-08: qty 2

## 2018-12-08 MED ORDER — BENZONATATE 100 MG PO CAPS: 100.0000 mg | ORAL_CAPSULE | Freq: Three times a day (TID) | ORAL | 0 refills | Status: DC | PRN

## 2018-12-08 MED ORDER — ALBUTEROL SULFATE HFA 108 (90 BASE) MCG/ACT IN AERS
2.0000 | INHALATION_SPRAY | Freq: Once | RESPIRATORY_TRACT | Status: AC
  Administered 2018-12-08: 2 via RESPIRATORY_TRACT
  Filled 2018-12-08: qty 6.7

## 2018-12-08 MED ORDER — PREDNISONE 10 MG PO TABS: 40.0000 mg | ORAL_TABLET | Freq: Every day | ORAL | 0 refills | Status: DC

## 2018-12-08 MED ORDER — NITROGLYCERIN 2 % TD OINT
1.0000 [in_us] | TOPICAL_OINTMENT | Freq: Once | TRANSDERMAL | Status: AC
  Administered 2018-12-08: 1 [in_us] via TOPICAL
  Filled 2018-12-08: qty 1

## 2018-12-08 NOTE — Discharge Instructions (Signed)
Individuals who are confirmed to have, or are being evaluated for, COVID-19 should follow the prevention steps below until a healthcare provider or local or state health department says they can return to normal activities.  Stay home except to get medical care You should restrict activities outside your home, except for getting medical care. Do not go to work, school, or public areas, and do not use public transportation or taxis.  Call ahead before visiting your doctor Before your medical appointment, call the healthcare provider and tell them that you have, or are being evaluated for, COVID-19 infection. This will help the healthcare providers office take steps to keep other people from getting infected. Ask your healthcare provider to call the local or state health department.  Monitor your symptoms Seek prompt medical attention if your illness is worsening (e.g., difficulty breathing). Before going to your medical appointment, call the healthcare provider and tell them that you have, or are being evaluated for, COVID-19 infection. Ask your healthcare provider to call the local or state health department.  Wear a facemask You should wear a facemask that covers your nose and mouth when you are in the same room with other people and when you visit a healthcare provider. People who live with or visit you should also wear a facemask while they are in the same room with you.  Separate yourself from other people in your home As much as possible, you should stay in a different room from other people in your home. Also, you should use a separate bathroom, if available.  Avoid sharing household items You should not share dishes, drinking glasses, cups, eating utensils, towels, bedding, or other items with other people in your home. After using these items, you should wash them thoroughly with soap and water.  Cover your coughs and sneezes Cover your mouth and nose with a tissue when  you cough or sneeze, or you can cough or sneeze into your sleeve. Throw used tissues in a lined trash can, and immediately wash your hands with soap and water for at least 20 seconds or use an alcohol-based hand rub.  Wash your [facility] your hands often and thoroughly with soap and water for at least 20 seconds. You can use an alcohol-based hand sanitizer if soap and water are not available and if your hands are not visibly dirty. Avoid touching your eyes, nose, and mouth with unwashed hands.   Prevention Steps for Caregivers and Household Members of Individuals Confirmed to have, or Being Evaluated for, COVID-19 Infection Being Cared for in the Home  If you live with, or provide care at home for, a person confirmed to have, or being evaluated for, COVID-19 infection please follow these guidelines to prevent infection:  Follow healthcare providers instructions Make sure that you understand and can help the patient follow any healthcare provider instructions for all care.  Provide for the patients basic needs You should help the patient with basic needs in the home and provide support for getting groceries, prescriptions, and other personal needs.  Monitor the patients symptoms If they are getting sicker, call his or her medical provider and tell them that the patient has, or is being evaluated for, COVID-19 infection. This will help the healthcare providers office take steps to keep other people from getting infected. Ask the healthcare provider to call the local or state health department.  Limit the number of people who have contact with the patient If possible, have only one caregiver for the  patient. Other household members should stay in another home or place of residence. If this is not possible, they should stay in another room, or be separated from the patient as much as possible. Use a separate bathroom, if available. Restrict visitors who do not have an essential need  to be in the home.  Keep older adults, very young children, and other sick people away from the patient Keep older adults, very young children, and those who have compromised immune systems or chronic health conditions away from the patient. This includes people with chronic heart, lung, or kidney conditions, diabetes, and cancer.  Ensure good ventilation Make sure that shared spaces in the home have good air flow, such as from an air conditioner or an opened window, weather permitting.  Wash your hands often Wash your hands often and thoroughly with soap and water for at least 20 seconds. You can use an alcohol based hand sanitizer if soap and water are not available and if your hands are not visibly dirty. Avoid touching your eyes, nose, and mouth with unwashed hands. Use disposable paper towels to dry your hands. If not available, use dedicated cloth towels and replace them when they become wet.  Wear a facemask and gloves Wear a disposable facemask at all times in the room and gloves when you touch or have contact with the patients blood, body fluids, and/or secretions or excretions, such as sweat, saliva, sputum, nasal mucus, vomit, urine, or feces.  Ensure the mask fits over your nose and mouth tightly, and do not touch it during use. Throw out disposable facemasks and gloves after using them. Do not reuse. Wash your hands immediately after removing your facemask and gloves. If your personal clothing becomes contaminated, carefully remove clothing and launder. Wash your hands after handling contaminated clothing. Place all used disposable facemasks, gloves, and other waste in a lined container before disposing them with other household waste. Remove gloves and wash your hands immediately after handling these items.  Do not share dishes, glasses, or other household items with the patient Avoid sharing household items. You should not share dishes, drinking glasses, cups, eating utensils,  towels, bedding, or other items with a patient who is confirmed to have, or being evaluated for, COVID-19 infection. After the person uses these items, you should wash them thoroughly with soap and water.  Wash laundry thoroughly Immediately remove and wash clothes or bedding that have blood, body fluids, and/or secretions or excretions, such as sweat, saliva, sputum, nasal mucus, vomit, urine, or feces, on them. Wear gloves when handling laundry from the patient. Read and follow directions on labels of laundry or clothing items and detergent. In general, wash and dry with the warmest temperatures recommended on the label.  Clean all areas the individual has used often Clean all touchable surfaces, such as counters, tabletops, doorknobs, bathroom fixtures, toilets, phones, keyboards, tablets, and bedside tables, every day. Also, clean any surfaces that may have blood, body fluids, and/or secretions or excretions on them. Wear gloves when cleaning surfaces the patient has come in contact with. Use a diluted bleach solution (e.g., dilute bleach with 1 part bleach and 10 parts water) or a household disinfectant with a label that says EPA-registered for coronaviruses. To make a bleach solution at home, add 1 tablespoon of bleach to 1 quart (4 cups) of water. For a larger supply, add  cup of bleach to 1 gallon (16 cups) of water. Read labels of cleaning products and follow recommendations provided on  product labels. Labels contain instructions for safe and effective use of the cleaning product including precautions you should take when applying the product, such as wearing gloves or eye protection and making sure you have good ventilation during use of the product. Remove gloves and wash hands immediately after cleaning.  Monitor yourself for signs and symptoms of illness Caregivers and household members are considered close contacts, should monitor their health, and will be asked to limit movement  outside of the home to the extent possible. Follow the monitoring steps for close contacts listed on the symptom monitoring form.   ? If you have additional questions, contact your local health department or call the epidemiologist on call at [phone] (available 24/7). ? This guidance is subject to change. For the most up-to-date guidance from [facility], please refer to their website: [email or web address]

## 2018-12-08 NOTE — ED Provider Notes (Signed)
[facility] EMERGENCY DEPARTMENT Provider Note   CSN: [id number] Arrival date & time: 12/08/18  1023    History   Chief Complaint Chief Complaint  Patient presents with  . Shortness of Breath  . Leg Swelling    HPI Lori Bean is a 48 y.o. female.     The history is provided by the patient and medical records. No language interpreter was used.  Shortness of Breath   Lori Bean is a 48 y.o. female who presents to the Emergency Department complaining of cough and shortness of breath. She fell ill about three days ago with increased shortness of breath followed by cough productive of white and brown sputum. She has occasional intermittent left sided chest pain that is sharp in nature, worse with coughing. She denies any fevers. No known sick contacts. She also reports acute on chronic lower extremity edema. She is compliant with her home medications including Lasix 80 mg BID. She states she does not feel like she urinates this much when she is taking Lasix now. Symptoms are similar to prior asthma exacerbations as well as prior CHF exacerbation's. Symptoms are moderate to severe, constant, worsening.  Past Medical History:  Diagnosis Date  . Anginal pain ([place])    occ from asthma  . Anxiety   . Arthritis   . Asthma   . Bipolar affective disorder ([place])    Schizophrenia  . Bronchitis    hx of  . CHF (congestive heart failure) ([place])   . Depression   . GERD (gastroesophageal reflux disease)   . Heart murmur   . Hypertension    takes meds  . Lymphedema 06/07/2018  . Morbid (severe) obesity due to excess calories ([place])   . Neuropathy    left leg , "from back surgery"  . Overactive bladder   . Schizophrenia ([place])   . Sciatica   . Shortness of breath   . Sleep apnea   . Snoring disorder    Pt stated my boyfriend always wakes me up and tells me to breathe    Patient Active Problem List   Diagnosis Date Noted  . COPD mixed type ([place])  08/17/2018  . Sinusitis 07/22/2018  . Lymphedema 06/07/2018  . Abnormal uterine bleeding (AUB) 12/12/2017  . Heart murmur   . CHF (congestive heart failure) ([place])   . Chronic asthma, mild persistent, uncomplicated 67/89/3810  . Dyspnea on exertion 05/05/2017  . Asthma exacerbation in COPD ([place]) 05/05/2017  . Morbid obesity due to excess calories ([place]) 05/05/2017  . Asthma exacerbation 12/17/2016  . 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 Synovial plica of right knee 17/51/0258  . Tear of medial meniscus of right knee, initial encounter 09/28/2016  . OSA (obstructive sleep apnea) 09/23/2016  . Chronic diastolic heart failure ([place]) 04/22/2016  . Prediabetes 04/22/2016  . Cigarette smoker 04/03/2014  . Obesity (BMI 30-39.9) 04/03/2014  . Chest pain 04/03/2014  . Mild cardiomegaly 02/21/2014  . Lumbar spondylosis 12/28/2013  . Extrinsic asthma 06/07/2013  . Anxiety   . Essential hypertension   . Bipolar affective disorder ([place])   . GERD (gastroesophageal reflux disease)   . Arthritis   . Schizophrenia ([place])   . Overactive bladder     Past Surgical History:  Procedure Laterality Date  . BACK SURGERY     3 back surgeries  . CHOLECYSTECTOMY    . COLONOSCOPY WITH PROPOFOL N/A 01/23/2016   Procedure: COLONOSCOPY WITH PROPOFOL;  Surgeon: Wonda Horner, MD;  Location:  [facility];  Service: Endoscopy;  Laterality: N/A;  . EYE SURGERY     Metal plate in right eye. Had fracture in right eye  . gallstones reomved    . KNEE ARTHROSCOPY WITH MENISCAL REPAIR Right 09/28/2016   Procedure: KNEE ARTHROSCOPY WITH MENISCAL REPAIR;  Surgeon: Dorna Leitz, MD;  Location: [facility];  Service: Orthopedics;  Laterality: Right;  Right partial meniscectomy and chondroplasty, patellar/femoral joint and medial femoral condyle   . LUMBAR LAMINECTOMY/DECOMPRESSION MICRODISCECTOMY  04/01/2012   Procedure: LUMBAR LAMINECTOMY/DECOMPRESSION MICRODISCECTOMY 2 LEVELS;  Surgeon: Faythe Ghee, MD;  Location: [facility]  NEURO ORS;  Service: Neurosurgery;  Laterality: Left;  Lumbar four-five, lumbar five sacral one microdiscectomy   . LUMBAR WOUND DEBRIDEMENT  04/29/2012   Procedure: LUMBAR WOUND DEBRIDEMENT;  Surgeon: Faythe Ghee, MD;  Location: [facility] NEURO ORS;  Service: Neurosurgery;  Laterality: N/A;  lumbar wound debridement  . ROTATOR CUFF REPAIR     Right shoulder  . TUBAL LIGATION       OB History    Gravida  4   Para  2   Term  0   Preterm  2   AB  2   Living  2     SAB  1   TAB  1   Ectopic      Multiple      Live Births  2            Home Medications    Prior to Admission medications   Medication Sig Start Date End Date Taking? Authorizing Provider  albuterol (PROAIR HFA) 108 (90 Base) MCG/ACT inhaler Inhale 2 puffs into the lungs every 4 (four) hours as needed for wheezing or shortness of breath.   Yes [provider]  albuterol (PROVENTIL) (2.5 MG/3ML) 0.083% nebulizer solution Take 2.5 mg by nebulization every 4 (four) hours as needed for wheezing or shortness of breath.   Yes [provider]  amitriptyline (ELAVIL) 25 MG tablet Take 25 mg by mouth at bedtime.   Yes [provider]  amLODipine (NORVASC) 10 MG tablet Take 1 tablet (10 mg total) by mouth daily. 05/17/18 12/08/18 Yes [name], MD  ARIPiprazole (ABILIFY) 20 MG tablet Take 1 tablet (20 mg total) by mouth at bedtime. Patient taking differently: Take 20 mg by mouth 2 (two) times daily.  03/04/15  Yes Lance Bosch, NP  budesonide-formoterol (SYMBICORT) 160-4.5 MCG/ACT inhaler Inhale 2 puffs into the lungs 2 (two) times daily. 04/01/18  Yes Parrett, [name] S, NP  carvedilol (COREG) 25 MG tablet Take 1 tablet (25 mg total) by mouth 2 (two) times daily. 05/17/18 12/08/18 Yes [name], MD  cetirizine (ZYRTEC) 10 MG tablet Take 1 tablet (10 mg total) by mouth daily. 07/20/18  Yes Martyn Ehrich, NP  DULoxetine (CYMBALTA) 30 MG capsule Take 30 mg by mouth 2 (two) times  daily.   Yes [provider]  famotidine (PEPCID) 20 MG tablet One at bedtime Patient taking differently: Take 20 mg by mouth at bedtime.  06/04/17  Yes Tanda Rockers, MD  furosemide (LASIX) 80 MG tablet Take 1 tablet (80 mg total) by mouth 2 (two) times daily. 05/17/18  Yes [name], MD  LYRICA 150 MG capsule Take 150 mg by mouth 2 (two) times daily. 03/19/18  Yes [provider]  meloxicam (MOBIC) 15 MG tablet Take 15 mg by mouth daily as needed for pain. 12/21/17  Yes [provider]  montelukast (SINGULAIR) 10 MG tablet Take 1  tablet (10 mg total) by mouth at bedtime. 08/17/18  Yes Martyn Ehrich, NP  nicotine (NICODERM CQ - DOSED IN MG/24 HOURS) 14 mg/24hr patch After completing 21 mg dose for 6 weeks, taper down and place one patch (14 mg) onto skin daily for 2 weeks 08/25/18  Yes Martyn Ehrich, NP  nicotine (NICODERM CQ - DOSED IN MG/24 HOURS) 21 mg/24hr patch Start taper and place one patch onto skin daily for 6 weeks. 08/25/18  Yes Martyn Ehrich, NP  nicotine (NICODERM CQ - DOSED IN MG/24 HR) 7 mg/24hr patch Place 1 patch (7 mg total) onto the skin daily. After completing 14 mg dose for 2 weeks, place one patch onto skin (7 mg) daily for 2 weeks. Then stop 08/25/18  Yes Martyn Ehrich, NP  omeprazole (PRILOSEC) 40 MG capsule Take 1 capsule (40 mg total) by mouth daily. 12/14/17  Yes [name], MD  potassium chloride SA (K-DUR,KLOR-CON) 20 MEQ tablet Take 20 mEq by mouth daily.   Yes [provider]  Tiotropium Bromide Monohydrate (SPIRIVA RESPIMAT) 1.25 MCG/ACT AERS Inhale 2 puffs into the lungs daily. 08/17/18  Yes Martyn Ehrich, NP  tiZANidine (ZANAFLEX) 4 MG tablet Take 4 mg by mouth every 8 (eight) hours as needed for spasms. 02/14/18  Yes [provider]  triamterene-hydrochlorothiazide (MAXZIDE-25) 37.5-25 MG tablet Take 1 tablet by mouth daily. 02/14/18  Yes [provider]  valsartan (DIOVAN) 320 MG  tablet Take 1 tablet (320 mg total) by mouth daily. 05/20/18  Yes [name], MD  benzonatate (TESSALON) 100 MG capsule Take 1 capsule (100 mg total) by mouth 3 (three) times daily as needed for cough. 12/08/18   Quintella Reichert, MD  predniSONE (DELTASONE) 10 MG tablet Take 4 tablets (40 mg total) by mouth daily. 12/08/18   Quintella Reichert, MD  Respiratory Therapy Supplies (FLUTTER) DEVI Use flutter device 3 times a day 07/20/18   Martyn Ehrich, NP  solifenacin (VESICARE) 5 MG tablet Take 5 mg by mouth daily.  04/06/18  [provider]    Family History Family History  Problem Relation Age of Onset  . Diabetes Mother   . Hypertension Mother   . Diabetes Father   . Heart disease Paternal Aunt   . Cancer Paternal Aunt     Social History Social History   Tobacco Use  . Smoking status: Current Every Day Smoker    Packs/day: 0.50    Years: 24.00    Pack years: 12.00    Types: Cigarettes  . Smokeless tobacco: Never Used  Substance Use Topics  . Alcohol use: No    Alcohol/week: 0.0 standard drinks    Comment: quit Nov. 2014  . Drug use: No     Allergies   Aspirin   Review of Systems Review of Systems  Respiratory: Positive for shortness of breath.   All other systems reviewed and are negative.    Physical Exam Updated Vital Signs BP (!) 170/72 (BP Location: Right Arm)   Pulse 90   Temp 98.7 F (37.1 C) (Oral)   Resp 18   Wt 118.8 kg   SpO2 100%   BMI 47.92 kg/m   Physical Exam Vitals signs and nursing note reviewed.  Constitutional:      Appearance: She is well-developed.  HENT:     Head: Normocephalic and atraumatic.  Cardiovascular:     Rate and Rhythm: Normal rate and regular rhythm.     Heart sounds: No murmur.  Pulmonary:  Effort: Pulmonary effort is normal. No respiratory distress.     Comments: Wheezing bilaterally, frequent coughing Abdominal:     Palpations: Abdomen is soft.     Tenderness: There is no abdominal tenderness.  There is no guarding or rebound.  Musculoskeletal:        General: Swelling present. No tenderness.     Comments: 2+ pitting edema to BLE  Skin:    General: Skin is warm and dry.  Neurological:     Mental Status: She is alert and oriented to person, place, and time.  Psychiatric:        Mood and Affect: Mood normal.        Behavior: Behavior normal.      ED Treatments / Results  Labs (all labs ordered are listed, but only abnormal results are displayed) Labs Reviewed  COMPREHENSIVE METABOLIC PANEL - Abnormal; Notable for the following components:      Result Value   Glucose, Bld 112 (*)    Total Bilirubin 1.5 (*)    All other components within normal limits  CBC WITH DIFFERENTIAL/PLATELET - Abnormal; Notable for the following components:   RDW 15.9 (*)    Monocytes Absolute 1.3 (*)    All other components within normal limits  BRAIN NATRIURETIC PEPTIDE  TROPONIN I    EKG EKG Interpretation  Date/Time:  Thursday December 08 2018 10:35:05 EDT Ventricular Rate:  92 PR Interval:    QRS Duration: 114 QT Interval:  387 QTC Calculation: 479 R Axis:   40 Text Interpretation:  Sinus rhythm Anteroseptal infarct, old Artifact in lead(s) I III aVR aVL V5 and baseline wander in lead(s) V2 Confirmed by Quintella Reichert [phone]) on 12/08/2018 10:50:42 AM   Radiology Dg Chest Port 1 View  Result Date: 12/08/2018 CLINICAL DATA:  Cough, asthma, shortness of breath for 3 days EXAM: PORTABLE CHEST 1 VIEW COMPARISON:  09/20/2018 FINDINGS: There is hazy left lower lobe airspace disease likely reflecting atelectasis. There is no focal consolidation. There is no pleural effusion or pneumothorax. The heart and mediastinal contours are unremarkable. The osseous structures are unremarkable. IMPRESSION: No active disease. Electronically Signed   By: Kathreen Devoid   On: 12/08/2018 11:03    Procedures Procedures (including critical care time)  Medications Ordered in ED Medications  albuterol  (PROVENTIL HFA;VENTOLIN HFA) 108 (90 Base) MCG/ACT inhaler 2 puff (2 puffs Inhalation Given 12/08/18 1125)  nitroGLYCERIN (NITROGLYN) 2 % ointment 1 inch (1 inch Topical Given 12/08/18 1123)  methylPREDNISolone sodium succinate (SOLU-MEDROL) 125 mg/2 mL injection 125 mg (125 mg Intravenous Given 12/08/18 1218)  albuterol (PROVENTIL HFA;VENTOLIN HFA) 108 (90 Base) MCG/ACT inhaler 4 puff (4 puffs Inhalation Given 12/08/18 1218)  furosemide (LASIX) injection 80 mg (80 mg Intravenous Given 12/08/18 1216)  benzonatate (TESSALON) capsule 100 mg (100 mg Oral Given 12/08/18 1414)     Initial Impression / Assessment and Plan / ED Course  I have reviewed the triage vital signs and the nursing notes.  Pertinent labs & imaging results that were available during my care of the patient were reviewed by me and considered in my medical decision making (see chart for details).        Patient with history of COPD and CHF here for evaluation of progressive edema and wheezing. She does have wheezing on examination without respiratory distress. No evidence of pneumonia. Presentation is not consistent with acute pulmonary edema/CHF exacerbation. Doubt PE. She is able to ambulate without hypoxia in the department. Following treatment with albuterol she  is feeling improved. Discussed with patient home care for asthma exacerbation. Discussed there may be an element of a viral infection as well and discussed social distancing and isolation.  Discussed with patient that she does not meet testing criteria for COVID virus but it is possible because it is in the community.  Close outpatient follow-up and return precautions discussed.      Final Clinical Impressions(s) / ED Diagnoses   Final diagnoses:  Peripheral edema  Moderate persistent asthma with acute exacerbation    ED Discharge Orders         Ordered    predniSONE (DELTASONE) 10 MG tablet  Daily     12/08/18 1408    benzonatate (TESSALON) 100 MG capsule  3  times daily PRN     12/08/18 1408           Quintella Reichert, MD 12/08/18 1507

## 2018-12-08 NOTE — ED Triage Notes (Signed)
Pt arrives to ED from home with complaints of shortness of breath and noticed recent swelling in her lower extremities for the last four days. Pt has hx of CHF.

## 2018-12-08 NOTE — ED Notes (Signed)
Pt ambulated with steady gait.  She became more short of breath while walking.  SpO2 maintained above 94 at all times.  SpO2 returned to 100 after lying down.

## 2019-01-21 IMAGING — CR DG CHEST 2V
2 series · 2 of 2 positions shown · non-contrast
Comparison: PA and lateral chest 10/22/2016 and 12/23/2015. CT
chest 08/17/2015.

CLINICAL DATA: Shortness of breath and 10 pound weight gain over
the past 2 weeks.

EXAM:
CHEST  2 VIEW

[chest pa]
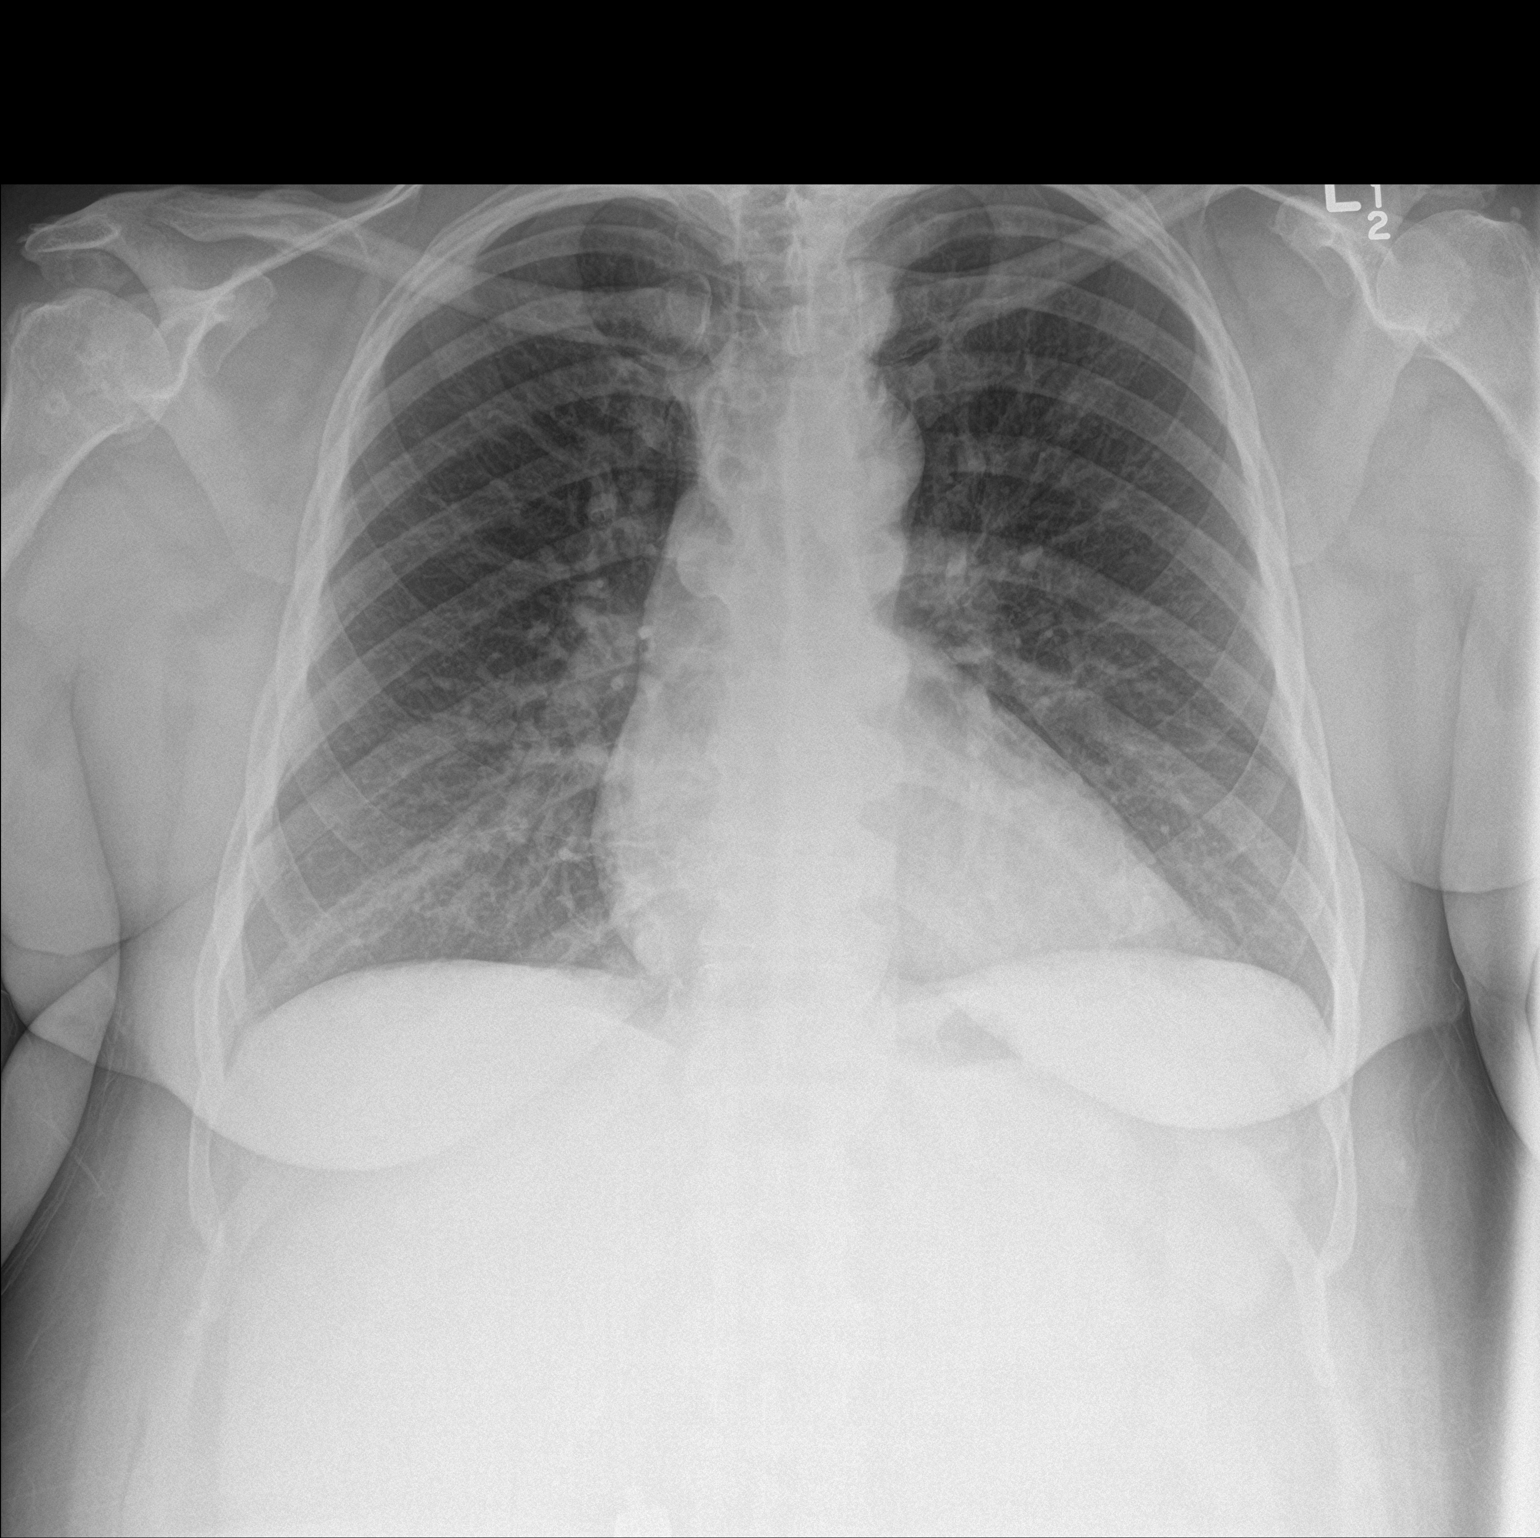

[chest lat]
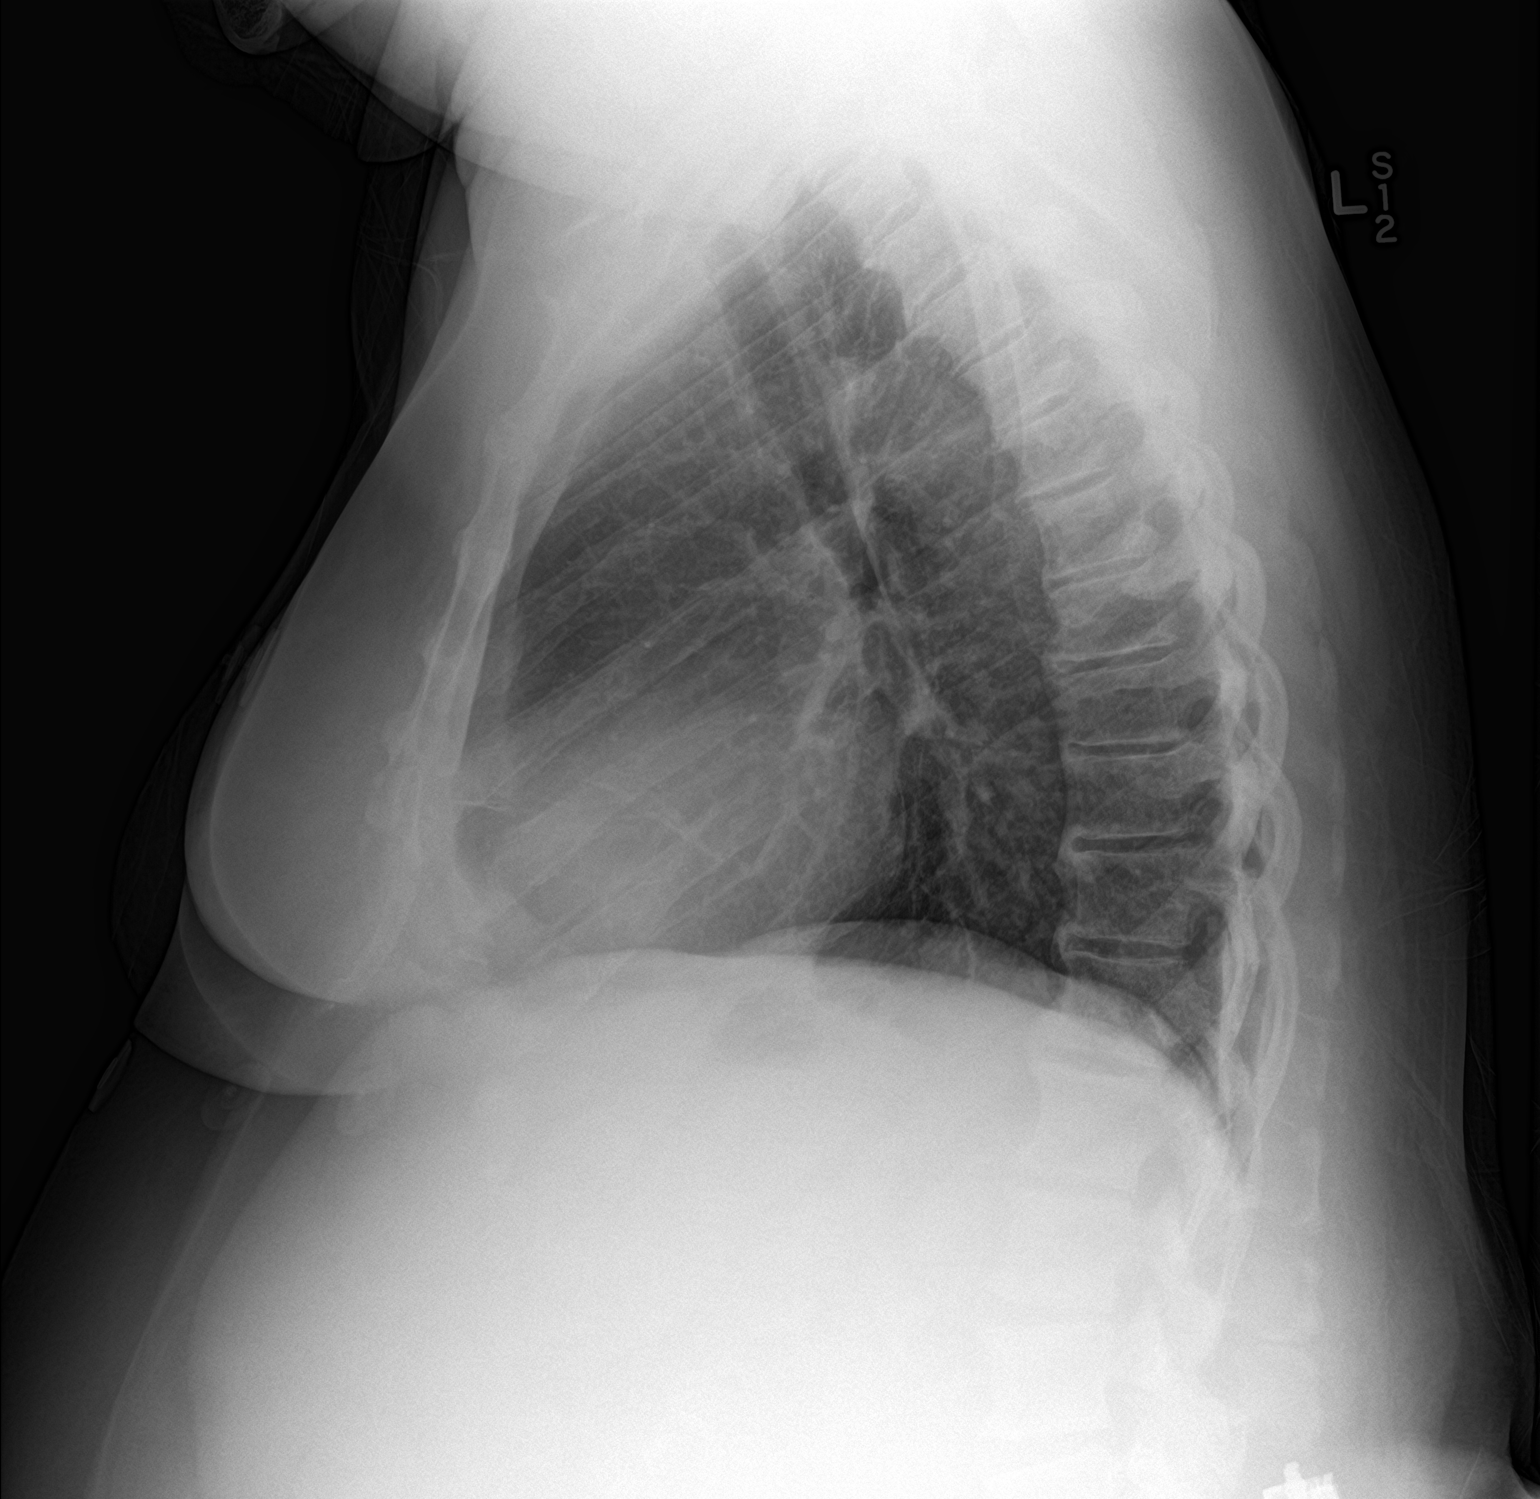

[2 of 2 positions shown; findings below may reference images not displayed]

FINDINGS: The lungs are clear. Heart size is normal. No pneumothorax or
pleural effusion. No acute bony abnormality.
IMPRESSION: No acute disease.

## 2019-02-10 ENCOUNTER — Telehealth: Payer: Self-pay | Admitting: Cardiology

## 2019-02-10 NOTE — Telephone Encounter (Signed)
smartphone/ consent/ my chart active/ pre reg completed °

## 2019-02-13 ENCOUNTER — Other Ambulatory Visit: Payer: Self-pay

## 2019-02-13 ENCOUNTER — Encounter: Payer: Self-pay | Admitting: Internal Medicine

## 2019-02-13 ENCOUNTER — Ambulatory Visit (INDEPENDENT_AMBULATORY_CARE_PROVIDER_SITE_OTHER): Payer: Medicaid Other | Admitting: Internal Medicine

## 2019-02-13 DIAGNOSIS — J4530 Mild persistent asthma, uncomplicated: Secondary | ICD-10-CM

## 2019-02-13 DIAGNOSIS — I10 Essential (primary) hypertension: Secondary | ICD-10-CM

## 2019-02-13 DIAGNOSIS — F17210 Nicotine dependence, cigarettes, uncomplicated: Secondary | ICD-10-CM

## 2019-02-13 MED ORDER — BISOPROLOL FUMARATE 5 MG PO TABS: ORAL_TABLET | ORAL | 11 refills | Status: DC

## 2019-02-13 NOTE — Patient Instructions (Signed)
Stop the coreg and start bisoprolol 5 gm one pill twice daily   Work on inhaler technique:  relax and gently blow all the way out then take a nice smooth deep breath back in, triggering the inhaler at same time you start breathing in.  Hold for up to 5 seconds if you can. Blow out thru nose. Rinse and gargle with water when done     The key is to stop smoking completely before smoking completely stops you! -  For smoking cessation classes call [phone]       Please schedule a follow up visit in 3 months but call sooner if needed

## 2019-02-13 NOTE — Progress Notes (Signed)
Subjective:     Patient ID: Lori Bean, female   DOB: [birth year]      MRN: [id number]    Brief patient profile:  48 yobf MO active smoker  healthy child/ 2 IUP's  With baseline wt < 200 with new doe x 2012 assoc with wt gain gain x 2016 referred to pulmonary clinic 06/04/2017 by Dr   Vista Lawman with dx mild asthma ? Early copd   Admit date: 12/17/2016 Discharge date: 12/18/2016  Discharge Diagnoses:  Possible asthma exacerbation Essential hypertension Chronic diastolic heart failure Prediabetes Morbid obesity  History of present illness:  48 y.o.femalewith medical history significant of Asthma and OSA (reversibility demonstrated on PFTs in Feb this year), smoking, HTN, CHF grade 2 diastolic dysfunction. Patient presents to the ED with c/o SOB. Symptoms onset at rest yesterday, also has 10lb weight gain over past 3 weeks but states she is compliant with lasix 80 BID. 2 albuterol nebs before seeing PCP this AM. PCP sent her to ED for evaluation.  Hospital Course:  Possible asthma exacerbation -Patient states that her wheezing has actually improved and she is feeling better today -She recently finished a course of prednisone back in February 2018 -She has been following up with the pulmonary clinic -Chest x-ray -Spoke with Dr. [name], patient's pulmonologist. Of note, progress note from Dr. [name] back in January 2018 stated that he was not convinced of her asthma. At this time, Dr. [name] recommended no change in patient's medications. Follow-up as an outpatient. -Continue Advair Diskus, pro-air, QVAR -Patient was placed on prednisone 40 mg, will continue this for 4 days.  Essential hypertension -Continue amlodipine -Lasix was discontinued as she does complain of chronic cough  Chronic diastolic heart failure -Echocardiogram 05/24/2015 showed EF of 7510%, grade 2 diastolic dysfunction -Currently appears euvolemic and compensated on exam. -BNP 24.8 -Continue home Lasix  dose with potassium supplementation  Prediabetes -Hemoglobin A1c was 6.1 back in 2016. This can be followed and managed as an outpatient by patient's primary care physician.  Morbid obesity -BMI 47.1 -Patient only talk to her primary care physician regarding weight loss and lifestyle modifications. -Patient states she feels that she has gained 10-15 pounds in the past several weeks. States she has been watching what she eats. Admits that she has been using different scales at different doctors offices. Inquires about whether there is a program that offers free scales. Case management stated that this program is no longer available.       06/04/2017 1st   office visit/   Re transition of care  Chief Complaint  Patient presents with  . Pulmonary Consult    Referred by Dr. Vista Lawman. Pt c/o DOE for approx 6 years, worse over the past wk to the point that she is winded just walking from room to room at home. She states also having some prod cough with large amounts of black colored sputum.  She has CP "stabbing pain" comes and goes and lasts for several minutes. She has a rash on her arm- not improving after seeing derm. She has also noticed blurred vision. She is using her proair inhaler 3-4 x per day.   first started on proair helped some, then symbicort sev months prior to OV>  Not much response, even neb now doesn't always help. 6 years prior to OV  Trouble flat and x 6 months sleeping upright due to sob immediately on lying back Doe progressive   But esp over the last week to room  to room now Coughing more since 2 months > black mucus assoc with cp with coughing  rec Plan A = Automatic = symbicort 80 Take 2 puffs first thing in am and then another 2 puffs about 12 hours later.  Pantoprazole (protonix) 40 mg (or prilosec 20 x 2)    Take  30-60 min before first meal of the day and Pepcid (famotidine)  20 mg one @  bedtime until return to office - this is the best way to tell whether  stomach acid is contributing to your problem.   Plan B = Backup Only use your albuterol as a rescue medication Plan C = Nebulizer  GERD diet   07/22/18 NP eval [name]  Continue on Symbicort Twice daily  . Rinse after use.  Work on not smoking    10/28/2017  f/u ov/ re: sob/ ? How much is asthma / still smoking  Chief Complaint  Patient presents with  . Follow-up    Breathing has been worse for the past wk- SOB walking from room to room. She is also c/o wheezing and some chest tightness. She is using her albuterol inhaler 4-5 x per day on average.   Dyspnea: room to room x one week Cough: more than usual x one week thick clear/ assoc st and dysphagia ? On ppi ac ?  Sleep: ok on cpap s 02   Using saba q am instead of prn  Midline pain with coughing only  rec Plan A = Automatic = change symbicort to 160 Take 2 puffs first thing in am and then another 2 puffs about 12 hours later.  Work on inhaler technique:     Plan B = Backup Only use your albuterol as a rescue medication Plan C = Crisis - only use your albuterol nebulizer if you first try Plan B       03/04/2018  f/u ov/ re: AB on symbicort 160/ acei/ coreg 25 bid  Chief Complaint  Patient presents with  . Follow-up    still SOB, sore throat, difficulty swallowing, productive cough   Dyspnea:  Room to room Cough: is worse at hs / like choking /  [place] mucus/ nose stuffy now all x 2 months on acei gradually worse  Sleep: on cpap with some cough/gag  SABA use:  None rec Plan A = Automatic = change symbicort to 80 Take 2 puffs first thing in am and then another 2 puffs about 12 hours later.  Work on inhaler technique:   Plan B = Backup Only use your albuterol as a rescue medication Plan C = Crisis - only use your albuterol nebulizer if you first try Plan B stop lisinopril and take avapro (ibesartan) 300 mg one daily in its place  Augmentin 875 mg take one pill twice daily  X 10 days - Prednisone 10 mg take  4 each am  x 2 days,   2 each am x 2 days,  1 each am x 2 days and stop  Please schedule a follow up office visit in 4 weeks, sooner if needed to see [name] NP but bring all meds with you      NP ov 04/01/18 rec Increase Symbicort 160/4.50mg 2 puffs Twice daily  , rinse after use.  Mucinex DM Twice daily  As needed  Cough/congestion  Work on not smoking .  Continue on BIPAP At bedtime   Low salt diet , keep legs elevated.  Follow up with Cardiology as discussed.  07/01/18 ER eval for sob Dx  chf  rx lasix     07/04/2018  Extended transition of care  f/u ov/ post er eval  re: copd/ab still smoking/ over using saba, did not use symb yet today and hfa poor / did not bring meds Chief Complaint  Patient presents with  . Follow-up    chronic asthma, patient uses albuterol TID  Dyspnea:  MMRC2 = can't walk a nl pace on a flat grade s sob but does fine slow and flat can shop HT/ [employer]  Cough: worse x sev weeks, better while on zpak worse off/ brown mucus 24/7 / assoc with overt HB Sleeping: on cpap, not bipap, bothered by cough / has not had any sleep f/u by [name] in years  SABA use: as above  rec Plan A = Automatic = symbicort 160 Take 2 puffs first thing in am and then another 2 puffs about 12 hours later.  Work on inhaler technique:    Plan B = Backup Only use your albuterol as a rescue medication  07/20/18  NP eval Start Zyrtec- take daily during allergy season or until symptoms improve  Start Mucinex twice daily for congestion  Flutter valve three times a day for congestion  Continue Symbicort 2 puffs twice a day (every day) Back up- Albuterol rescue inhaler 2 puffs every 4-6 hours as needed only for sob/wheezing     08/17/18 NP rec Pulmonary function test showed some restriction and mild obstruction consistent with early COPD - likely due to smoking and weight  Continue to work on quitting smoking  Adding Singulair, take once daily at bedtime- this is to help with allergy  symptoms and prevent asthma exacerbations (sent in RX) Adding Spiriva, take 2 puffs daily (given sample, RX sent to pharmacy) Continue Symbicort 160 - 2 puffs twice a day  Nicotine patch (RX sent)      11/21/2018  f/u ov/ re: copd/ ab  Still smoking/ maint on symb/spiriva  Chief Complaint  Patient presents with  . Follow-up    feeling about the same - cough (prod-brownish)    Dyspnea:  Room to room / does [employer] once week able to do an aisle leaning on cart = MMRC3 = can't walk 100 yards even at a slow pace at a flat grade s stopping due to sob   Cough: worse than usual x 2 weeks  When lies down with slt brown color, thicker than usual  Sleeping: on back 3 pillows and cpap  SABA use: not very helpful though hfa poor  02: none rec Zpak, No change in other medications Work on inhaler technique:  relax and gently blow all the way out then take a nice smooth deep breath back in, triggering the inhaler at same time you start breathing in.  Hold for up to 5 seconds if you can. Blow out thru nose. Rinse and gargle with water when done Only use your albuterol as a rescue medication to be used The key is to stop smoking completely before smoking completely stops you! - For smoking cessation classes call [phone]    Please schedule a follow up visit in 3 months but call sooner if needed and bring your inhalers with you  Add:  May need trial off coreg and on bisoprolol      02/13/2019  f/u ov/ re: copd/ab did not bring all  Inhalers / still on coreg 25 bid Chief Complaint  Patient presents with  . Follow-up  Chronic Asthma - patient stated medications are working for her, no issues.  Dyspnea:  Does [employer] once a week leaning on cart/ crosses parking lot  Cough: none  Sleeping: on bed / flat with 3 pillows / with cpap  SABA use: less than once a week 02: none   No obvious day to day or daytime variability or assoc excess/ purulent sputum or mucus plugs or hemoptysis or  cp or chest tightness, subjective wheeze or overt sinus or hb symptoms.   Sleeping as aove  without nocturnal  or early am exacerbation  of respiratory  c/o's or need for noct saba. Also denies any obvious fluctuation of symptoms with weather or environmental changes or other aggravating or alleviating factors except as outlined above   No unusual exposure hx or h/o childhood pna/ asthma or knowledge of premature birth.  Current Allergies, Complete Past Medical History, Past Surgical History, Family History, and Social History were reviewed in [employer] record.  ROS  The following are not active complaints unless bolded Hoarseness, sore throat, dysphagia, dental problems, itching, sneezing,  nasal congestion or discharge of excess mucus or purulent secretions, ear ache,   fever, chills, sweats, unintended wt loss or wt gain, classically pleuritic or exertional cp,  orthopnea pnd or arm/hand swelling  or leg swelling, presyncope, palpitations, abdominal pain, anorexia, nausea, vomiting, diarrhea  or change in bowel habits or change in bladder habits, change in stools or change in urine, dysuria, hematuria,  rash, arthralgias, visual complaints, headache, numbness, weakness or ataxia or problems with walking or coordination,  change in mood or  memory.        Current Meds  Medication Sig  . albuterol (PROAIR HFA) 108 (90 Base) MCG/ACT inhaler Inhale 2 puffs into the lungs every 4 (four) hours as needed for wheezing or shortness of breath.  [name] amitriptyline (ELAVIL) 25 MG tablet Take 25 mg by mouth at bedtime.  . ARIPiprazole (ABILIFY) 20 MG tablet Take 1 tablet (20 mg total) by mouth at bedtime. (Patient taking differently: Take 20 mg by mouth 2 (two) times daily. )  . budesonide-formoterol (SYMBICORT) 160-4.5 MCG/ACT inhaler Inhale 2 puffs into the lungs 2 (two) times daily.  . cetirizine (ZYRTEC) 10 MG tablet Take 1 tablet (10 mg total) by mouth daily.  . famotidine  (PEPCID) 20 MG tablet One at bedtime (Patient taking differently: Take 20 mg by mouth at bedtime. )  . furosemide (LASIX) 80 MG tablet Take 1 tablet (80 mg total) by mouth 2 (two) times daily.  [name] gabapentin (NEURONTIN) 300 MG capsule Take 1 capsule by mouth 3 (three) times daily.  [name] HYDROcodone-acetaminophen (NORCO/VICODIN) 5-325 MG tablet Take 1 tablet by mouth as needed.  . hydrOXYzine (VISTARIL) 25 MG capsule Take 1 capsule by mouth daily.  . montelukast (SINGULAIR) 10 MG tablet Take 1 tablet (10 mg total) by mouth at bedtime.  [name] omeprazole (PRILOSEC) 40 MG capsule Take 1 capsule (40 mg total) by mouth daily.  . potassium chloride SA (K-DUR,KLOR-CON) 20 MEQ tablet Take 20 mEq by mouth daily.  . predniSONE (DELTASONE) 10 MG tablet Take 4 tablets (40 mg total) by mouth daily.  [name] Respiratory Therapy Supplies (FLUTTER) DEVI Use flutter device 3 times a day  . Tiotropium Bromide Monohydrate (SPIRIVA RESPIMAT) 1.25 MCG/ACT AERS Inhale 2 puffs into the lungs daily.  [name] triamterene-hydrochlorothiazide (MAXZIDE-25) 37.5-25 MG tablet Take 1 tablet by mouth daily.  . valsartan (DIOVAN) 320 MG tablet Take 1 tablet (320 mg  total) by mouth daily.                       Objective:   Physical Exam    amb obese bf nad   02/13/2019         269  11/21/2018          264 07/04/2018      245  03/04/2018       240  10/28/2017       252   06/04/17 250 lb (113.4 kg)  05/14/17 250 lb 12.8 oz (113.8 kg)  05/06/17 242 lb (109.8 kg)     Vital signs reviewed - Note on arrival 02 sats  98% on  RA    HEENT: nl dentition, turbinates bilaterally, and oropharynx. Nl external ear canals without cough reflex   NECK :  without JVD/Nodes/TM/ nl carotid upstrokes bilaterally   LUNGS: no acc muscle use,  Nl contour chest with distant insp/exp rhonchi  bilaterally without cough on insp or exp maneuvers   CV:  RRR  no s3 or murmur or increase in P2, and 1-2+ pitting both LE's  ABD: Obese/  soft and nontender  with nl inspiratory excursion in the supine position. No bruits or organomegaly appreciated, bowel sounds nl  MS:  Nl gait/ ext warm without deformities, calf tenderness, cyanosis or clubbing No obvious joint restrictions   SKIN: warm and dry without lesions    NEURO:  alert, approp, nl sensorium with  no motor or cerebellar deficits apparent.       Assessment:

## 2019-02-14 ENCOUNTER — Telehealth (INDEPENDENT_AMBULATORY_CARE_PROVIDER_SITE_OTHER): Payer: Medicaid Other | Admitting: Cardiology

## 2019-02-14 ENCOUNTER — Encounter: Payer: Self-pay | Admitting: Cardiology

## 2019-02-14 ENCOUNTER — Telehealth: Payer: Self-pay

## 2019-02-14 VITALS — BP 127/72 | HR 75 | Ht 62.0 in | Wt 269.0 lb

## 2019-02-14 DIAGNOSIS — F17210 Nicotine dependence, cigarettes, uncomplicated: Secondary | ICD-10-CM

## 2019-02-14 DIAGNOSIS — J449 Chronic obstructive pulmonary disease, unspecified: Secondary | ICD-10-CM

## 2019-02-14 DIAGNOSIS — I890 Lymphedema, not elsewhere classified: Secondary | ICD-10-CM

## 2019-02-14 DIAGNOSIS — I5032 Chronic diastolic (congestive) heart failure: Secondary | ICD-10-CM | POA: Diagnosis not present

## 2019-02-14 DIAGNOSIS — G4733 Obstructive sleep apnea (adult) (pediatric): Secondary | ICD-10-CM

## 2019-02-14 DIAGNOSIS — C029 Malignant neoplasm of tongue, unspecified: Secondary | ICD-10-CM | POA: Insufficient documentation

## 2019-02-14 DIAGNOSIS — F209 Schizophrenia, unspecified: Secondary | ICD-10-CM

## 2019-02-14 NOTE — Patient Instructions (Signed)
Medication Instructions:  Your physician recommends that you continue on your current medications as directed. Please refer to the Current Medication list given to you today.  If you need a refill on your cardiac medications before your next appointment, please call your pharmacy.   Lab work: none If you have labs (blood work) drawn today and your tests are completely normal, you will receive your results only by: [name] MyChart Message (if you have MyChart) OR . A paper copy in the mail If you have any lab test that is abnormal or we need to change your treatment, we will call you to review the results.  Testing/Procedures: None   Follow-Up: At [facility], you and your health needs are our priority.  As part of our continuing mission to provide you with exceptional heart care, we have created designated Provider Care Teams.  These Care Teams include your primary Cardiologist (physician) and Advanced Practice Providers (APPs -  Physician Assistants and Nurse Practitioners) who all work together to provide you with the care you need, when you need it. You will need a follow up appointment in 12 months.  Please call our office 2 months in advance to schedule this appointment.  You may see [name], MD or one of the following Advanced Practice Providers on your designated Care Team:   Kerin Ransom, PA-C Roby Lofts, [place] . Sande Rives, PA-C  Any Other Special Instructions Will Be Listed Below (If Applicable).

## 2019-02-14 NOTE — Telephone Encounter (Signed)
Contacted patient to discuss AVS instructions. Patient agreeable with Luke's recommendations and voiced understanding. AVS will be mailed to patient by medical records. 

## 2019-02-14 NOTE — Progress Notes (Signed)
Virtual Visit via Telephone Note   This visit type was conducted due to national recommendations for restrictions regarding the COVID-19 Pandemic (e.g. social distancing) in an effort to limit this patient's exposure and mitigate transmission in our community.  Due to her co-morbid illnesses, this patient is at least at moderate risk for complications without adequate follow up.  This format is felt to be most appropriate for this patient at this time.  The patient did not have access to video technology/had technical difficulties with video requiring transitioning to audio format only (telephone).  All issues noted in this document were discussed and addressed.  No physical exam could be performed with this format.  Please refer to the patient's chart for her  consent to telehealth for [facility].   Date:  02/14/2019   ID:  Lori Bean, DOB 04-17-1971, MRN [id number]  Patient Location: Home Provider Location: Home  PCP:  [name], PA  Cardiologist:  [name], MD  Electrophysiologist:  None   Evaluation Performed:  Follow-Up Visit  Chief Complaint:  None- doing better  History of Present Illness:    Lori Bean is a 48 y.o. female with with multiple medical problems followed by Dr. Oval Linsey.  She has a history of diastolic heart failure.  She had a low risk Myoview in 2016, echocardiogram in 2016 showed an EF of 60 to 65% with grade 2 diastolic dysfunction.  She has treated hypertension.  She has morbid obesity and sleep apnea, she is on CPAP.  She has COPD with an asthmatic component, followed by [place] [facility].  She has chronic lymphedema.  She has fairly severe lower extremity DJD which limits her activity.  She has a psychiatric history with schizophrenia and bipolar disorder noted in her chart.  In the past her BNP's have all been within normal limits and we feel her lower extremity edema is secondary to lymphedema as opposed to diastolic heart  failure.  In February of this year she underwent a tongue biopsy which apparently showed some cancerous cells.  She is being followed by Dr. Tamela Oddi at [facility].  She tells me she will need further surgery but this is been put on hold secondary to the [place] V[place] pandemic.  She was contacted today for routine follow-up.  She had been seen in the emergency room in March with shortness of breath.  She tells me today she has been doing much better.  She is not having any issues with her medications.  She still smoking, about a half a pack a day.  She is trying to watch her diet but unfortunately her weight has continued to creep up.  The patient does not have symptoms concerning for COVID-19 infection (fever, chills, cough, or new shortness of breath).    Past Medical History:  Diagnosis Date   Anginal pain ([place])    occ from asthma   Anxiety    Arthritis    Asthma    Bipolar affective disorder ([place])    Schizophrenia   Bronchitis    hx of   CHF (congestive heart failure) (HCC)    Depression    GERD (gastroesophageal reflux disease)    Heart murmur    Hypertension    takes meds   Lymphedema 06/07/2018   Morbid (severe) obesity due to excess calories (HCC)    Neuropathy    left leg , "from back surgery"   Overactive bladder    Schizophrenia ([place])    Sciatica  Shortness of breath    Sleep apnea    Snoring disorder    Pt stated my boyfriend always wakes me up and tells me to breathe   Past Surgical History:  Procedure Laterality Date   BACK SURGERY     3 back surgeries   CHOLECYSTECTOMY     COLONOSCOPY WITH PROPOFOL N/A 01/23/2016   Procedure: COLONOSCOPY WITH PROPOFOL;  Surgeon: Wonda Horner, MD;  Location: [facility];  Service: Endoscopy;  Laterality: N/A;   EYE SURGERY     Metal plate in right eye. Had fracture in right eye   gallstones reomved     KNEE ARTHROSCOPY WITH MENISCAL REPAIR Right 09/28/2016   Procedure: KNEE ARTHROSCOPY WITH MENISCAL  REPAIR;  Surgeon: Dorna Leitz, MD;  Location: [facility];  Service: Orthopedics;  Laterality: Right;  Right partial meniscectomy and chondroplasty, patellar/femoral joint and medial femoral condyle    LUMBAR LAMINECTOMY/DECOMPRESSION MICRODISCECTOMY  04/01/2012   Procedure: LUMBAR LAMINECTOMY/DECOMPRESSION MICRODISCECTOMY 2 LEVELS;  Surgeon: Faythe Ghee, MD;  Location: [facility];  Service: Neurosurgery;  Laterality: Left;  Lumbar four-five, lumbar five sacral one microdiscectomy    LUMBAR WOUND DEBRIDEMENT  04/29/2012   Procedure: LUMBAR WOUND DEBRIDEMENT;  Surgeon: Faythe Ghee, MD;  Location: [facility];  Service: Neurosurgery;  Laterality: N/A;  lumbar wound debridement   ROTATOR CUFF REPAIR     Right shoulder   TUBAL LIGATION       Current Meds  Medication Sig   albuterol (PROAIR HFA) 108 (90 Base) MCG/ACT inhaler Inhale 2 puffs into the lungs every 4 (four) hours as needed for wheezing or shortness of breath.   albuterol (PROVENTIL) (2.5 MG/3ML) 0.083% nebulizer solution Take 2.5 mg by nebulization every 4 (four) hours as needed for wheezing or shortness of breath.   amitriptyline (ELAVIL) 25 MG tablet Take 25 mg by mouth at bedtime.   amLODipine (NORVASC) 10 MG tablet Take 1 tablet (10 mg total) by mouth daily.   ARIPiprazole (ABILIFY) 20 MG tablet Take 1 tablet (20 mg total) by mouth at bedtime. (Patient taking differently: Take 20 mg by mouth 2 (two) times daily. )   bisoprolol (ZEBETA) 5 MG tablet One twice daily   budesonide-formoterol (SYMBICORT) 160-4.5 MCG/ACT inhaler Inhale 2 puffs into the lungs 2 (two) times daily.   DULoxetine (CYMBALTA) 30 MG capsule Take 30 mg by mouth 2 (two) times daily.   famotidine (PEPCID) 20 MG tablet One at bedtime (Patient taking differently: Take 20 mg by mouth at bedtime. )   furosemide (LASIX) 80 MG tablet Take 1 tablet (80 mg total) by mouth 2 (two) times daily.   gabapentin (NEURONTIN) 300 MG capsule Take 1 capsule by mouth 3  (three) times daily.   HYDROcodone-acetaminophen (NORCO/VICODIN) 5-325 MG tablet Take 1 tablet by mouth as needed.   hydrOXYzine (VISTARIL) 25 MG capsule Take 1 capsule by mouth daily.   montelukast (SINGULAIR) 10 MG tablet Take 1 tablet (10 mg total) by mouth at bedtime.   omeprazole (PRILOSEC) 40 MG capsule Take 1 capsule (40 mg total) by mouth daily.   potassium chloride SA (K-DUR,KLOR-CON) 20 MEQ tablet Take 20 mEq by mouth daily.   Respiratory Therapy Supplies (FLUTTER) DEVI Use flutter device 3 times a day   Tiotropium Bromide Monohydrate (SPIRIVA RESPIMAT) 1.25 MCG/ACT AERS Inhale 2 puffs into the lungs daily.   triamterene-hydrochlorothiazide (MAXZIDE-25) 37.5-25 MG tablet Take 1 tablet by mouth daily.   valsartan (DIOVAN) 320 MG tablet Take 1 tablet (320 mg total)  by mouth daily.     Allergies:   Aspirin   Social History   Tobacco Use   Smoking status: Current Every Day Smoker    Packs/day: 0.50    Years: 24.00    Pack years: 12.00    Types: Cigarettes   Smokeless tobacco: Never Used  Substance Use Topics   Alcohol use: No    Alcohol/week: 0.0 standard drinks    Comment: quit Nov. 2014   Drug use: No     Family Hx: The patient's family history includes Cancer in her paternal aunt; Diabetes in her father and mother; Heart disease in her paternal aunt; Hypertension in her mother.  ROS:   Please see the history of present illness.    All other systems reviewed and are negative.   Prior CV studies:   The following studies were reviewed today:   Labs/Other Tests and Data Reviewed:    EKG:  No ECG reviewed.  Recent Labs: 12/08/2018: ALT 28; B Natriuretic Peptide 11.9; BUN 11; Creatinine, Ser 0.73; Hemoglobin 14.9; Platelets 226; Potassium 5.0; Sodium 137   Recent Lipid Panel Lab Results  Component Value Date/Time   CHOL 148 06/07/2018 10:24 AM   TRIG 106 06/07/2018 10:24 AM   HDL 51 06/07/2018 10:24 AM   CHOLHDL 2.9 06/07/2018 10:24 AM   CHOLHDL  2.7 05/25/2015 03:33 AM   LDLCALC 76 06/07/2018 10:24 AM    Wt Readings from Last 3 Encounters:  02/14/19 269 lb (122 kg)  02/13/19 269 lb 3.2 oz (122.1 kg)  12/08/18 262 lb (118.8 kg)     Objective:    Vital Signs:  BP 127/72    Pulse 75    Ht 5\' 2"  (1.575 m)    Wt 269 lb (122 kg)    BMI 49.20 kg/m    VITAL SIGNS:  reviewed  ASSESSMENT & PLAN:    Chronic diastolic CHF Compensated per her history  Lymphedema- On high dose diuretics  HTN- Controlled  Morbid obesity- Weight is up to 269 lbs- BMI 49  OSA- On C-pap followed by  Pulmonary  Asthmatic COPD unfortunately still smoking. 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  She thinks she will need further surgery-timing is not clear.    COVID-19 Education: The signs and symptoms of COVID-19 were discussed with the patient and how to seek care for testing (follow up with PCP or arrange E-visit).  The importance of social distancing was discussed today.  Time:   Today, I have spent 15 minutes with the patient with telehealth technology discussing the above problems.     Medication Adjustments/Labs and Tests Ordered: Current medicines are reviewed at length with the patient today.  Concerns regarding medicines are outlined above.   Tests Ordered: No orders of the defined types were placed in this encounter.   Medication Changes: No orders of the defined types were placed in this encounter.   Disposition:  Follow up Dr Oval Linsey in one year  Signed, Kerin Ransom, Hershal Coria  02/14/2019 9:12 AM    [place]

## 2019-02-14 NOTE — Telephone Encounter (Signed)
VERBAL CONSENT GIVEN TO NICOLE CURTIS ON 5/27/2020AT 10:17AM.     Virtual Visit Pre-Appointment Phone Call  "Lori Bean, I am calling you today to discuss your upcoming appointment. We are currently trying to limit exposure to the virus that causes COVID-19 by seeing patients at home rather than in the office."  1. "What is the BEST phone number to call the day of the visit?" - include this in appointment notes  2. "Do you have or have access to (through a family member/friend) a smartphone with video capability that we can use for your visit?" a. If yes - list this number in appt notes as "cell" (if different from BEST phone #) and list the appointment type as a VIDEO visit in appointment notes b. If no - list the appointment type as a PHONE visit in appointment notes  3. Confirm consent - "In the setting of the current Covid19 crisis, you are scheduled for a (phone or video) visit with your provider on (date) at (time).  Just as we do with many in-office visits, in order for you to participate in this visit, we must obtain consent.  If you'd like, I can send this to your mychart (if signed up) or email for you to review.  Otherwise, I can obtain your verbal consent now.  All virtual visits are billed to your insurance company just like a normal visit would be.  By agreeing to a virtual visit, we'd like you to understand that the technology does not allow for your provider to perform an examination, and thus may limit your provider's ability to fully assess your condition. If your provider identifies any concerns that need to be evaluated in person, we will make arrangements to do so.  Finally, though the technology is pretty good, we cannot assure that it will always work on either your or our end, and in the setting of a video visit, we may have to convert it to a phone-only visit.  In either situation, we cannot ensure that we have a secure connection.  Are you willing to proceed?" STAFF: Did  the patient verbally acknowledge consent to telehealth visit? Document YES/NO here: YES  4. Advise patient to be prepared - "Two hours prior to your appointment, go ahead and check your blood pressure, pulse, oxygen saturation, and your weight (if you have the equipment to check those) and write them all down. When your visit starts, your provider will ask you for this information. If you have an Apple Watch or Kardia device, please plan to have heart rate information ready on the day of your appointment. Please have a pen and paper handy nearby the day of the visit as well."  5. Give patient instructions for MyChart download to smartphone OR Doximity/[email or web address] as below if video visit (depending on what platform provider is using)  6. Inform patient they will receive a phone call 15 minutes prior to their appointment time (may be from unknown caller ID) so they should be prepared to answer    Lori Bean has been deemed a candidate for a follow-up tele-health visit to limit community exposure during the Covid-19 pandemic. I spoke with the patient via phone to ensure availability of phone/video source, confirm preferred email & phone number, and discuss instructions and expectations.  I reminded Lori Bean to be prepared with any vital sign and/or heart rhythm information that could potentially be obtained via home monitoring, at the time of her visit.  I reminded Lori Bean to expect a phone call prior to her visit.  Harold Hedge, CMA 02/14/2019 7:35 AM   INSTRUCTIONS FOR DOWNLOADING THE MYCHART APP TO SMARTPHONE  - The patient must first make sure to have activated MyChart and know their login information - If Apple, go to [employer] and type in MyChart in the search bar and download the app. If Android, ask patient to go to [employer] and type in [facility] in the search bar and download the app. The app is free but as with any other app  downloads, their phone may require them to verify saved payment information or Apple/Android password.  - The patient will need to then log into the app with their MyChart username and password, and select [facility] as their healthcare provider to link the account. When it is time for your visit, go to the MyChart app, find appointments, and click Begin Video Visit. Be sure to Select Allow for your device to access the Microphone and Camera for your visit. You will then be connected, and your provider will be with you shortly.  **If they have any issues connecting, or need assistance please contact MyChart service desk [phone])**  **If using a computer, in order to ensure the best quality for their visit they will need to use either of the following Internet Browsers: [employer], or Google Chrome**  IF USING DOXIMITY or DOXY.ME - The patient will receive a link just prior to their visit by text.     FULL LENGTH CONSENT FOR TELE-HEALTH VISIT   I hereby voluntarily request, consent and authorize [facility] and its employed or contracted physicians, physician assistants, nurse practitioners or other licensed health care professionals (the Practitioner), to provide me with telemedicine health care services (the "Services") as deemed necessary by the treating Practitioner. I acknowledge and consent to receive the Services by the Practitioner via telemedicine. I understand that the telemedicine visit will involve communicating with the Practitioner through live audiovisual communication technology and the disclosure of certain medical information by electronic transmission. I acknowledge that I have been given the opportunity to request an in-person assessment or other available alternative prior to the telemedicine visit and am voluntarily participating in the telemedicine visit.  I understand that I have the right to withhold or withdraw my consent to the use of telemedicine  in the course of my care at any time, without affecting my right to future care or treatment, and that the Practitioner or I may terminate the telemedicine visit at any time. I understand that I have the right to inspect all information obtained and/or recorded in the course of the telemedicine visit and may receive copies of available information for a reasonable fee.  I understand that some of the potential risks of receiving the Services via telemedicine include:  [name] Delay or interruption in medical evaluation due to technological equipment failure or disruption; . Information transmitted may not be sufficient (e.g. poor resolution of images) to allow for appropriate medical decision making by the Practitioner; and/or  . In rare instances, security protocols could fail, causing a breach of personal health information.  Furthermore, I acknowledge that it is my responsibility to provide information about my medical history, conditions and care that is complete and accurate to the best of my ability. I acknowledge that Practitioner's advice, recommendations, and/or decision may be based on factors not within their control, such as incomplete or inaccurate data provided by me or  distortions of diagnostic images or specimens that may result from electronic transmissions. I understand that the practice of medicine is not an exact science and that Practitioner makes no warranties or guarantees regarding treatment outcomes. I acknowledge that I will receive a copy of this consent concurrently upon execution via email to the email address I last provided but may also request a printed copy by calling the office of [facility].    I understand that my insurance will be billed for this visit.   I have read or had this consent read to me. . I understand the contents of this consent, which adequately explains the benefits and risks of the Services being provided via telemedicine.  . I have been provided ample  opportunity to ask questions regarding this consent and the Services and have had my questions answered to my satisfaction. . I give my informed consent for the services to be provided through the use of telemedicine in my medical care  By participating in this telemedicine visit I agree to the above.

## 2019-02-20 ENCOUNTER — Encounter: Payer: Self-pay | Admitting: Internal Medicine

## 2019-02-20 NOTE — Assessment & Plan Note (Signed)
In the setting of respiratory symptoms of unknown etiology,  It would be preferable to use bystolic, the most beta -1  selective Beta blocker available in sample form, with bisoprolol the most selective generic choice  on the market, at least on a trial basis, to make sure the spillover Beta 2 effects of the less specific Beta blockers are not contributing to this patient's symptoms.    Try bisoprolol 5 mg bid and stop coreg    I had an extended discussion with the patient reviewing all relevant studies completed to date and  lasting 15 to 20 minutes of a 25 minute visit    See device teaching which extended face to face time for this visit.  Each maintenance medication was reviewed in detail including emphasizing most importantly the difference between maintenance and prns and under what circumstances the prns are to be triggered using an action plan format that is not reflected in the computer generated alphabetically organized AVS which I have not found useful in most complex patients, especially with respiratory illnesses  Please see AVS for specific instructions unique to this visit that I personally wrote and verbalized to the the pt in detail and then reviewed with pt  by my nurse highlighting any  changes in therapy recommended at today's visit to their plan of care.

## 2019-02-20 NOTE — Assessment & Plan Note (Signed)
Counseled re importance of smoking cessation but did not meet time criteria for separate billing   °

## 2019-02-20 NOTE — Assessment & Plan Note (Signed)
Body mass index is 49.24 kg/m.  -  trending up Lab Results  Component Value Date   TSH 0.72 10/28/2017     Contributing to gerd risk/ doe/reviewed the need and the process to achieve and maintain neg calorie balance > defer f/u primary care including intermittently monitoring thyroid status

## 2019-02-20 NOTE — Assessment & Plan Note (Signed)
Onset of symptoms 2012 PFT's  11/10/2016  FEV1 2.06 (92 % ) ratio 81   p 24 % improvement from saba p ? prior to study with DLCO  93/94 % corrects to 102  % for alv volume   - 06/04/2017  After extensive coaching HFA effectiveness =    75% > try symbicort 80 2bid  - Spirometry 10/28/2017  FEV1 1.01 (45%)  Ratio 67  p am symb/saba/smoking  - 10/28/2017   try symb 160 2bid  - 03/04/2018  A try symb 80 2bid due to uacs component  - 04/01/18 try sym 160 2bid - Spirometry 07/04/2018  FEV1 1.1 (50%)  Ratio 69  Prior to rx - -  PFT's  07/20/18   FEV1 1.33 (60 % ) ratio 75  p 15 % improvement from saba p nothing prior to study with DLCO  81/78c % corrects to 104 % for alv volume  With ERV 6 and min curvature to f/v loop  And mild air trapping  - 02/13/2019  After extensive coaching inhaler device,  effectiveness = 75% short Ti     - 02/13/2019 try off coreg and on bisoprolol      Still with significant rhonchi on exam so reasonable to try more selective BB (see separate a/p)   F/u q 3 m, sooner if needed

## 2019-02-21 ENCOUNTER — Ambulatory Visit: Payer: Medicaid Other | Admitting: Internal Medicine

## 2019-03-30 ENCOUNTER — Ambulatory Visit: Payer: Medicaid Other | Admitting: Internal Medicine

## 2019-05-01 ENCOUNTER — Encounter (HOSPITAL_COMMUNITY): Payer: Self-pay

## 2019-05-01 ENCOUNTER — Emergency Department (HOSPITAL_COMMUNITY)
Admission: EM | Admit: 2019-05-01 | Discharge: 2019-05-02 | Disposition: A | Discharge status: Home / Self Care | Payer: Medicaid Other | Attending: Emergency Medicine | Admitting: Emergency Medicine

## 2019-05-01 ENCOUNTER — Other Ambulatory Visit: Payer: Self-pay

## 2019-05-01 DIAGNOSIS — F17210 Nicotine dependence, cigarettes, uncomplicated: Secondary | ICD-10-CM | POA: Insufficient documentation

## 2019-05-01 DIAGNOSIS — J45909 Unspecified asthma, uncomplicated: Secondary | ICD-10-CM | POA: Insufficient documentation

## 2019-05-01 DIAGNOSIS — K5792 Diverticulitis of intestine, part unspecified, without perforation or abscess without bleeding: Secondary | ICD-10-CM | POA: Diagnosis not present

## 2019-05-01 DIAGNOSIS — I110 Hypertensive heart disease with heart failure: Secondary | ICD-10-CM | POA: Diagnosis not present

## 2019-05-01 DIAGNOSIS — I5032 Chronic diastolic (congestive) heart failure: Secondary | ICD-10-CM | POA: Diagnosis not present

## 2019-05-01 DIAGNOSIS — R109 Unspecified abdominal pain: Secondary | ICD-10-CM | POA: Diagnosis present

## 2019-05-01 DIAGNOSIS — Z85810 Personal history of malignant neoplasm of tongue: Secondary | ICD-10-CM | POA: Diagnosis not present

## 2019-05-01 HISTORY — DX: Unspecified abdominal hernia without obstruction or gangrene: K46.9

## 2019-05-01 LAB — COMPREHENSIVE METABOLIC PANEL
ALT: 43 U/L (ref 0–44)
AST: 19 U/L (ref 15–41)
Albumin: 3.6 g/dL (ref 3.5–5.0)
Alkaline Phosphatase: 75 U/L (ref 38–126)
Anion gap: 12 (ref 5–15)
BUN: 14 mg/dL (ref 6–20)
CO2: 20 mmol/L — ABNORMAL LOW (ref 22–32)
Calcium: 9.2 mg/dL (ref 8.9–10.3)
Chloride: 105 mmol/L (ref 98–111)
Creatinine, Ser: 0.71 mg/dL (ref 0.44–1.00)
GFR calc Af Amer: >60 mL/min (ref >60)
GFR calc non Af Amer: >60 mL/min (ref >60)
Glucose, Bld: 113 mg/dL — ABNORMAL HIGH (ref 70–99)
Potassium: 4.4 mmol/L (ref 3.5–5.1)
Sodium: 137 mmol/L (ref 135–145)
Total Bilirubin: 0.2 mg/dL — ABNORMAL LOW (ref 0.3–1.2)
Total Protein: 7 g/dL (ref 6.5–8.1)

## 2019-05-01 LAB — CBC
HCT: 44.9 % (ref 36.0–46.0)
Hemoglobin: 14.4 g/dL (ref 12.0–15.0)
MCH: 31.8 pg (ref 26.0–34.0)
MCHC: 32.1 g/dL (ref 30.0–36.0)
MCV: 99.1 fL (ref 80.0–100.0)
Platelets: 176 10*3/uL (ref 150–400)
RBC: 4.53 MIL/uL (ref 3.87–5.11)
RDW: 15.1 % (ref 11.5–15.5)
WBC: 11.3 10*3/uL — ABNORMAL HIGH (ref 4.0–10.5)
nRBC: 0 % (ref 0.0–0.2)

## 2019-05-01 LAB — LIPASE, BLOOD: Lipase: 59 U/L — ABNORMAL HIGH (ref 11–51)

## 2019-05-01 LAB — I-STAT BETA HCG BLOOD, ED (MC, WL, AP ONLY): I-stat hCG, quantitative: <5 m[IU]/mL (ref <5)

## 2019-05-01 NOTE — ED Triage Notes (Signed)
Patient sent from PCP due having a "bulging hernia". New onset of n/v x1 week. Pain has been ongoing for a while, but has become unbearable in stomach chest area.

## 2019-05-02 ENCOUNTER — Emergency Department (HOSPITAL_COMMUNITY): Payer: Medicaid Other

## 2019-05-02 MED ORDER — DOCUSATE SODIUM 100 MG PO CAPS: 100.0000 mg | ORAL_CAPSULE | Freq: Two times a day (BID) | ORAL | 0 refills | Status: DC

## 2019-05-02 MED ORDER — FENTANYL CITRATE (PF) 100 MCG/2ML IJ SOLN
50.0000 ug | Freq: Once | INTRAMUSCULAR | Status: AC
  Administered 2019-05-02: 50 ug via INTRAVENOUS
  Filled 2019-05-02: qty 2

## 2019-05-02 MED ORDER — CIPROFLOXACIN IN D5W 400 MG/200ML IV SOLN
400.0000 mg | Freq: Once | INTRAVENOUS | Status: AC
  Administered 2019-05-02: 400 mg via INTRAVENOUS
  Filled 2019-05-02: qty 200

## 2019-05-02 MED ORDER — IOHEXOL 300 MG/ML  SOLN
100.0000 mL | Freq: Once | INTRAMUSCULAR | Status: AC | PRN
  Administered 2019-05-02: 100 mL via INTRAVENOUS

## 2019-05-02 MED ORDER — METRONIDAZOLE 500 MG PO TABS: 500.0000 mg | ORAL_TABLET | Freq: Two times a day (BID) | ORAL | 0 refills | Status: DC

## 2019-05-02 MED ORDER — METRONIDAZOLE IN NACL 5-0.79 MG/ML-% IV SOLN
500.0000 mg | Freq: Once | INTRAVENOUS | Status: AC
  Administered 2019-05-02: 500 mg via INTRAVENOUS
  Filled 2019-05-02: qty 100

## 2019-05-02 MED ORDER — CIPROFLOXACIN HCL 500 MG PO TABS: 500.0000 mg | ORAL_TABLET | Freq: Two times a day (BID) | ORAL | 0 refills | Status: DC

## 2019-05-02 NOTE — ED Notes (Signed)
Discharge instructions discussed with pt. Pt verbalized understanding. Pt stable and ambulatory. No signature pad available. 

## 2019-05-02 NOTE — ED Provider Notes (Signed)
Emergency Department Provider Note   I have reviewed the triage vital signs and the nursing notes.   HISTORY  Chief Complaint Hernia   HPI Lori Bean is a 48 y.o. female with multiple medical problems documented below who presents the emergency department today for concern of "bulging hernia" diagnosed by somebody at her primary care office.  Patient states that she is had about a months worth of worsening abdominal pain in the last week she says got a lot worse also associated nausea and vomiting.  Does seem to be associated with eating.  No change in bowel movements.  She has abdominal distention and also states that she is had some weight gain recently is unexplained.  No history of the same.  No fevers, cough or other associated symptoms.  No vaginal symptoms.   No other associated or modifying symptoms.    Past Medical History:  Diagnosis Date   Abdominal hernia    Anginal pain ([place])    occ from asthma   Anxiety    Arthritis    Asthma    Bipolar affective disorder ([place])    Schizophrenia   Bronchitis    hx of   CHF (congestive heart failure) (HCC)    Depression    GERD (gastroesophageal reflux disease)    Heart murmur    Hypertension    takes meds   Lymphedema 06/07/2018   Morbid (severe) obesity due to excess calories ([place])    Neuropathy    left leg , "from back surgery"   Overactive bladder    Schizophrenia ([place])    Sciatica    Shortness of breath    Sleep apnea    Snoring disorder    Pt stated my boyfriend always wakes me up and tells me to breathe    Patient Active Problem List   Diagnosis Date Noted   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   Sinusitis 07/22/2018   Lymphedema 06/07/2018   Abnormal uterine bleeding (AUB) 12/12/2017   Heart murmur    CHF (congestive heart failure) (HCC)    Chronic asthma, mild persistent, uncomplicated 19/62/2297   Dyspnea on exertion 05/05/2017    Asthma exacerbation in COPD ([place]) 05/05/2017   Morbid obesity due to excess calories ([place]) 05/05/2017   Asthma exacerbation 12/17/2016   Chondromalacia, right knee 98/92/1194   Synovial plica of right knee 17/40/8144   Tear of medial meniscus of right knee, initial encounter 09/28/2016   OSA (obstructive sleep apnea) 09/23/2016   Chronic diastolic heart failure ([place]) 04/22/2016   Prediabetes 04/22/2016   Cigarette smoker 04/03/2014   Obesity (BMI 30-39.9) 04/03/2014   Chest pain 04/03/2014   Mild cardiomegaly 02/21/2014   Lumbar spondylosis 12/28/2013   Extrinsic asthma 06/07/2013   Anxiety    Essential hypertension    Bipolar affective disorder ([place])    GERD (gastroesophageal reflux disease)    Arthritis    Schizophrenia ([place])    Overactive bladder     Past Surgical History:  Procedure Laterality Date   BACK SURGERY     3 back surgeries   CHOLECYSTECTOMY     COLONOSCOPY WITH PROPOFOL N/A 01/23/2016   Procedure: COLONOSCOPY WITH PROPOFOL;  Surgeon: Wonda Horner, MD;  Location: [facility];  Service: Endoscopy;  Laterality: N/A;   EYE SURGERY     Metal plate in right eye. Had fracture in right eye   gallstones reomved     KNEE ARTHROSCOPY WITH MENISCAL REPAIR Right 09/28/2016  Procedure: KNEE ARTHROSCOPY WITH MENISCAL REPAIR;  Surgeon: Dorna Leitz, MD;  Location: [facility];  Service: Orthopedics;  Laterality: Right;  Right partial meniscectomy and chondroplasty, patellar/femoral joint and medial femoral condyle    LUMBAR LAMINECTOMY/DECOMPRESSION MICRODISCECTOMY  04/01/2012   Procedure: LUMBAR LAMINECTOMY/DECOMPRESSION MICRODISCECTOMY 2 LEVELS;  Surgeon: Faythe Ghee, MD;  Location: [facility];  Service: Neurosurgery;  Laterality: Left;  Lumbar four-five, lumbar five sacral one microdiscectomy    LUMBAR WOUND DEBRIDEMENT  04/29/2012   Procedure: LUMBAR WOUND DEBRIDEMENT;  Surgeon: Faythe Ghee, MD;  Location: [facility];  Service: Neurosurgery;   Laterality: N/A;  lumbar wound debridement   ROTATOR CUFF REPAIR     Right shoulder   TUBAL LIGATION      Current Outpatient Rx   Order #: [id number] Class: Historical Med   Order #: [id number] Class: Historical Med   Order #: [id number] Class: Historical Med   Order #: [id number] Class: Normal   Order #: [id number] Class: Normal   Order #: [id number] Class: Normal   Order #: [id number] Class: Normal   Order #: [id number] Class: Normal   Order #: [id number] Class: Normal   Order #: [id number] Class: Normal   Order #: [id number] Class: Normal   Order #: [id number] Class: Historical Med   Order #: [id number] Class: Print   Order #: [id number] Class: Normal   Order #: [id number] Class: Historical Med   Order #: [id number] Class: Historical Med   Order #: [id number] Class: Historical Med   Order #: [id number] Class: Historical Med   Order #: [id number] Class: Historical Med   Order #: [id number] Class: Normal   Order #: [id number] Class: Normal   Order #: [id number] Class: Normal   Order #: [id number] Class: Normal   Order #: [id number] Class: Print   Order #: [id number] Class: Normal   Order #: [id number] Class: Historical Med   Order #: [id number] Class: Normal   Order #: [id number] Class: Print   Order #: [id number] Class: Normal   Order #: [id number] Class: Historical Med   Order #: [id number] Class: Historical Med   Order #: [id number] Class: Normal    Allergies Aspirin  Family History  Problem Relation Age of Onset   Diabetes Mother    Hypertension Mother    Diabetes Father    Heart disease Paternal Aunt    Cancer Paternal Aunt     Social History Social History   Tobacco Use   Smoking status: Current Every Day Smoker    Packs/day: 1.00    Years: 24.00    Pack years: 24.00    Types: Cigarettes   Smokeless tobacco: Never Used  Substance Use Topics   Alcohol use: No    Alcohol/week: 0.0 standard drinks    Comment: quit Nov. 2014   Drug use: No    Review of  Systems  All other systems negative except as documented in the HPI. All pertinent positives and negatives as reviewed in the HPI. ____________________________________________   PHYSICAL EXAM:  VITAL SIGNS: ED Triage Vitals  Enc Vitals Group     BP 05/01/19 1614 (!) 156/88     Pulse Rate 05/01/19 1614 87     Resp 05/01/19 1614 16     Temp 05/01/19 1614 99.5 F (37.5 C)     Temp Source 05/01/19 1614 Oral     SpO2 05/01/19 1614 98 %     Weight 05/01/19 1622 274 lb (124.3 kg)     Height 05/01/19 1622 5\' 2"  (1.575 m)     Head Circumference --      Peak Flow --      Pain Score 05/01/19 1622 10  Pain Loc --      Pain Edu? --      Excl. in [place]? --     Constitutional: Alert and oriented. Well appearing and in no acute distress. Eyes: Conjunctivae are normal. PERRL. EOMI. Head: Atraumatic. Nose: No congestion/rhinnorhea. Mouth/Throat: Mucous membranes are moist.  Oropharynx non-erythematous. Neck: No stridor.  No meningeal signs.   Cardiovascular: Normal rate, regular rhythm. Good peripheral circulation. Grossly normal heart sounds.   Respiratory: Normal respiratory effort.  No retractions. Lungs CTAB. Gastrointestinal: Soft and nontender. Mild distention with ttp in epigastric and LUQ. [name]  Musculoskeletal: No lower extremity tenderness nor edema. No gross deformities of extremities. Neurologic:  Normal speech and language. No gross focal neurologic deficits are appreciated.  Skin:  Skin is warm, dry and intact. No rash noted.  ____________________________________________   LABS (all labs ordered are listed, but only abnormal results are displayed)  Labs Reviewed  LIPASE, BLOOD - Abnormal; Notable for the following components:      Result Value   Lipase 59 (*)    All other components within normal limits  COMPREHENSIVE METABOLIC PANEL - Abnormal; Notable for the following components:   CO2 20 (*)    Glucose, Bld 113 (*)    Total Bilirubin 0.2 (*)    All other components  within normal limits  CBC - Abnormal; Notable for the following components:   WBC 11.3 (*)    All other components within normal limits  I-STAT BETA HCG BLOOD, ED ([facility], [facility], [facility] ONLY)   ____________________________________________  EKG   EKG Interpretation  Date/Time:    Ventricular Rate:    PR Interval:    QRS Duration:   QT Interval:    QTC Calculation:   R Axis:     Text Interpretation:         ____________________________________________  RADIOLOGY  Ct Abdomen Pelvis W Contrast  Result Date: 05/02/2019 CLINICAL DATA:  Acute abdominal pain. Nausea and vomiting EXAM: CT ABDOMEN AND PELVIS WITH CONTRAST TECHNIQUE: Multidetector CT imaging of the abdomen and pelvis was performed using the standard protocol following bolus administration of intravenous contrast. CONTRAST:  190mL OMNIPAQUE IOHEXOL 300 MG/ML  SOLN COMPARISON:  CT 06/24/2016 FINDINGS: Lower chest: Linear left lower lobe atelectasis. No pleural fluid or confluent airspace disease. Hepatobiliary: Enlarged liver with steatosis. Liver spans 18.5 cm cranial caudal. Gallbladder physiologically distended, no calcified stone. No biliary dilatation. Pancreas: No ductal dilatation or inflammation. Spleen: Normal in size without focal abnormality. Adrenals/Urinary Tract: Bilateral adrenal thickening without dominant nodule. No hydronephrosis or perinephric edema. Homogeneous renal enhancement with symmetric excretion on delayed phase imaging. Urinary bladder is partially distended without wall thickening. Stomach/Bowel: Stomach is unremarkable. No small bowel obstruction, wall thickening, or inflammation. Normal appendix courses inferior medially in the pelvis. Small volume of colonic stool. Diverticulosis most prominent in the sigmoid colon. Faint periaortic diverticular stranding in the proximal sigmoid colon, image 63 series 3. No abscess or perforation. Vascular/Lymphatic: Abdominal aorta is normal in caliber. Portal vein is patent.  No enlarged lymph nodes in the abdomen or pelvis. Reproductive: Uterus and bilateral adnexa are unremarkable. Other: No free air, free fluid, or intra-abdominal fluid collection. Diastasis of anterior abdominal musculature. Musculoskeletal: Degenerative and postsurgical change in the spine. Avascular necrosis of left femoral head, chronic. There are no acute or suspicious osseous abnormalities. IMPRESSION: 1. Findings suggesting mild acute diverticulitis of the proximal sigmoid colon. No perforation or abscess. 2. Hepatomegaly and hepatic steatosis. Electronically Signed   By: Aurther Loft.D.  On: 05/02/2019 03:30    ____________________________________________   PROCEDURES  Procedure(s) performed:   Procedures   ____________________________________________   INITIAL IMPRESSION / ASSESSMENT AND PLAN / ED COURSE  Don't feel a hernia but is tender and distension, could be gastric ulcer w/ or w/o perforation. CT, pain meds, dispo pending.   Diverticulitis on ct. Explains symptoms. No e/o complication. Plan for treatment at home.   Pertinent labs & imaging results that were available during my care of the patient were reviewed by me and considered in my medical decision making (see chart for details).  A medical screening exam was performed and I feel the patient has had an appropriate workup for their chief complaint at this time and likelihood of emergent condition existing is low. They have been counseled on decision, discharge, follow up and which symptoms necessitate immediate return to the emergency department. They or their family verbally stated understanding and agreement with plan and discharged in stable condition.   ____________________________________________  FINAL CLINICAL IMPRESSION(S) / ED DIAGNOSES  Final diagnoses:  Diverticulitis     MEDICATIONS GIVEN DURING THIS VISIT:  Medications  fentaNYL (SUBLIMAZE) injection 50 mcg (50 mcg Intravenous Given 05/02/19  0257)  iohexol (OMNIPAQUE) 300 MG/ML solution 100 mL (100 mLs Intravenous Contrast Given 05/02/19 0302)  ciprofloxacin (CIPRO) IVPB 400 mg (0 mg Intravenous Stopped 05/02/19 0520)  metroNIDAZOLE (FLAGYL) IVPB 500 mg (0 mg Intravenous Stopped 05/02/19 0520)     NEW OUTPATIENT MEDICATIONS STARTED DURING THIS VISIT:  Discharge Medication List as of 05/02/2019  4:56 AM    START taking these medications   Details  ciprofloxacin (CIPRO) 500 MG tablet Take 1 tablet (500 mg total) by mouth 2 (two) times daily., Starting Tue 05/02/2019, Normal    docusate sodium (COLACE) 100 MG capsule Take 1 capsule (100 mg total) by mouth every 12 (twelve) hours., Starting Tue 05/02/2019, Normal    metroNIDAZOLE (FLAGYL) 500 MG tablet Take 1 tablet (500 mg total) by mouth 2 (two) times daily. One po bid x 7 days, Starting Tue 05/02/2019, Normal        Note:  This note was prepared with assistance of Dragon voice recognition software. Occasional wrong-word or sound-a-like substitutions may have occurred due to the inherent limitations of voice recognition software.   May Manrique, Corene Cornea, MD 05/03/19 [phone]

## 2019-05-16 ENCOUNTER — Encounter (HOSPITAL_COMMUNITY): Payer: Self-pay | Admitting: Emergency Medicine

## 2019-05-16 ENCOUNTER — Other Ambulatory Visit: Payer: Self-pay

## 2019-05-16 ENCOUNTER — Ambulatory Visit (HOSPITAL_COMMUNITY)
Admission: EM | Admit: 2019-05-16 | Discharge: 2019-05-16 | Disposition: A | Discharge status: Home / Self Care | Payer: Medicaid Other | Attending: Physician Assistant | Admitting: Physician Assistant

## 2019-05-16 ENCOUNTER — Ambulatory Visit: Payer: Medicaid Other | Admitting: Internal Medicine

## 2019-05-16 ENCOUNTER — Ambulatory Visit (INDEPENDENT_AMBULATORY_CARE_PROVIDER_SITE_OTHER): Payer: Medicaid Other

## 2019-05-16 DIAGNOSIS — M7582 Other shoulder lesions, left shoulder: Secondary | ICD-10-CM

## 2019-05-16 DIAGNOSIS — M25512 Pain in left shoulder: Secondary | ICD-10-CM | POA: Diagnosis not present

## 2019-05-16 MED ORDER — MELOXICAM 15 MG PO TABS: 15.0000 mg | ORAL_TABLET | Freq: Every day | ORAL | 0 refills | Status: DC | PRN

## 2019-05-16 NOTE — ED Provider Notes (Signed)
[place]    CSN: [id number] Arrival date & time: 05/16/19  1026      History   Chief Complaint Chief Complaint  Patient presents with   Shoulder Pain   Arm Pain    HPI Lori Bean is a 48 y.o. female.   The history is provided by the patient.  Shoulder Pain Location:  Shoulder Shoulder location:  L shoulder Injury: no   Pain details:    Quality:  Aching and throbbing   Radiates to:  Does not radiate   Severity:  Moderate   Onset quality:  Gradual   Timing:  Constant   Progression:  Worsening Dislocation: no   Prior injury to area:  No Relieved by:  Nothing Worsened by:  Nothing Ineffective treatments:  None tried Associated symptoms: decreased range of motion   Associated symptoms: no back pain   Arm Pain  Pt complains of pain left shoulder.  Pt reports she has swelling in her arm.   Past Medical History:  Diagnosis Date   Abdominal hernia    Anginal pain ([place])    occ from asthma   Anxiety    Arthritis    Asthma    Bipolar affective disorder ([place])    Schizophrenia   Bronchitis    hx of   CHF (congestive heart failure) (HCC)    Depression    GERD (gastroesophageal reflux disease)    Heart murmur    Hypertension    takes meds   Lymphedema 06/07/2018   Morbid (severe) obesity due to excess calories ([place])    Neuropathy    left leg , "from back surgery"   Overactive bladder    Schizophrenia ([place])    Sciatica    Shortness of breath    Sleep apnea    Snoring disorder    Pt stated my boyfriend always wakes me up and tells me to breathe    Patient Active Problem List   Diagnosis Date Noted   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   Sinusitis 07/22/2018   Lymphedema 06/07/2018   Abnormal uterine bleeding (AUB) 12/12/2017   Heart murmur    CHF (congestive heart failure) (HCC)    Chronic asthma, mild persistent, uncomplicated [id number]   Dyspnea on exertion  05/05/2017   Asthma exacerbation in COPD ([place]) 05/05/2017   Morbid obesity due to excess calories ([place]) 05/05/2017   Asthma exacerbation 12/17/2016   Chondromalacia, right knee [id number]   Synovial plica of right knee [id number]   Tear of medial meniscus of right knee, initial encounter 09/28/2016   OSA (obstructive sleep apnea) 09/23/2016   Chronic diastolic heart failure ([place]) 04/22/2016   Prediabetes 04/22/2016   Cigarette smoker 04/03/2014   Obesity (BMI 30-39.9) 04/03/2014   Chest pain 04/03/2014   Mild cardiomegaly 02/21/2014   Lumbar spondylosis 12/28/2013   Extrinsic asthma 06/07/2013   Anxiety    Essential hypertension    Bipolar affective disorder ([place])    GERD (gastroesophageal reflux disease)    Arthritis    Schizophrenia ([place])    Overactive bladder     Past Surgical History:  Procedure Laterality Date   BACK SURGERY     3 back surgeries   CHOLECYSTECTOMY     COLONOSCOPY WITH PROPOFOL N/A 01/23/2016   Procedure: COLONOSCOPY WITH PROPOFOL;  Surgeon: Wonda Horner, MD;  Location: [facility];  Service: Endoscopy;  Laterality: N/A;   EYE SURGERY     Metal plate in right eye.  Had fracture in right eye   gallstones reomved     KNEE ARTHROSCOPY WITH MENISCAL REPAIR Right 09/28/2016   Procedure: KNEE ARTHROSCOPY WITH MENISCAL REPAIR;  Surgeon: Dorna Leitz, MD;  Location: [facility];  Service: Orthopedics;  Laterality: Right;  Right partial meniscectomy and chondroplasty, patellar/femoral joint and medial femoral condyle    LUMBAR LAMINECTOMY/DECOMPRESSION MICRODISCECTOMY  04/01/2012   Procedure: LUMBAR LAMINECTOMY/DECOMPRESSION MICRODISCECTOMY 2 LEVELS;  Surgeon: Faythe Ghee, MD;  Location: [facility];  Service: Neurosurgery;  Laterality: Left;  Lumbar four-five, lumbar five sacral one microdiscectomy    LUMBAR WOUND DEBRIDEMENT  04/29/2012   Procedure: LUMBAR WOUND DEBRIDEMENT;  Surgeon: Faythe Ghee, MD;  Location: [facility];   Service: Neurosurgery;  Laterality: N/A;  lumbar wound debridement   ROTATOR CUFF REPAIR     Right shoulder   TUBAL LIGATION      OB History    Gravida  4   Para  2   Term  0   Preterm  2   AB  2   Living  2     SAB  1   TAB  1   Ectopic      Multiple      Live Births  2            Home Medications    Prior to Admission medications   Medication Sig Start Date End Date Taking? Authorizing Provider  albuterol (PROAIR HFA) 108 (90 Base) MCG/ACT inhaler Inhale 2 puffs into the lungs every 4 (four) hours as needed for wheezing or shortness of breath.    [provider]  albuterol (PROVENTIL) (2.5 MG/3ML) 0.083% nebulizer solution Take 2.5 mg by nebulization every 4 (four) hours as needed for wheezing or shortness of breath.    [provider]  amitriptyline (ELAVIL) 25 MG tablet Take 25 mg by mouth at bedtime.    [provider]  amLODipine (NORVASC) 10 MG tablet Take 1 tablet (10 mg total) by mouth daily. 05/17/18 02/14/19  [name], MD  ARIPiprazole (ABILIFY) 20 MG tablet Take 1 tablet (20 mg total) by mouth at bedtime. Patient taking differently: Take 20 mg by mouth 2 (two) times daily.  03/04/15   Lance Bosch, NP  benzonatate (TESSALON) 100 MG capsule Take 1 capsule (100 mg total) by mouth 3 (three) times daily as needed for cough. Patient not taking: Reported on 02/13/2019 12/08/18   Quintella Reichert, MD  bisoprolol (ZEBETA) 5 MG tablet One twice daily 02/13/19   Tanda Rockers, MD  budesonide-formoterol [facility]) 160-4.5 MCG/ACT inhaler Inhale 2 puffs into the lungs 2 (two) times daily. 04/01/18   Parrett, Fonnie Mu, NP  cetirizine (ZYRTEC) 10 MG tablet Take 1 tablet (10 mg total) by mouth daily. Patient not taking: Reported on 02/14/2019 07/20/18   Martyn Ehrich, NP  ciprofloxacin (CIPRO) 500 MG tablet Take 1 tablet (500 mg total) by mouth 2 (two) times daily. 05/02/19   Mesner, Corene Cornea, MD  docusate sodium (COLACE) 100 MG capsule  Take 1 capsule (100 mg total) by mouth every 12 (twelve) hours. 05/02/19   Mesner, Corene Cornea, MD  DULoxetine (CYMBALTA) 30 MG capsule Take 30 mg by mouth 2 (two) times daily.    [provider]  famotidine (PEPCID) 20 MG tablet One at bedtime Patient taking differently: Take 20 mg by mouth at bedtime.  06/04/17   Tanda Rockers, MD  furosemide (LASIX) 80 MG tablet Take 1 tablet (80 mg total) by mouth 2 (  two) times daily. 05/17/18   [name], MD  gabapentin (NEURONTIN) 300 MG capsule Take 1 capsule by mouth 3 (three) times daily. 02/02/19   [provider]  HYDROcodone-acetaminophen (NORCO/VICODIN) 5-325 MG tablet Take 1 tablet by mouth as needed. 02/01/19   [provider]  hydrOXYzine (VISTARIL) 25 MG capsule Take 1 capsule by mouth daily. 02/02/19   [provider]  LYRICA 150 MG capsule Take 150 mg by mouth 2 (two) times daily. 03/19/18   [provider]  meloxicam (MOBIC) 15 MG tablet Take 1 tablet (15 mg total) by mouth daily as needed for pain. 05/16/19   Fransico Meadow, PA-C  metroNIDAZOLE (FLAGYL) 500 MG tablet Take 1 tablet (500 mg total) by mouth 2 (two) times daily. One po bid x 7 days 05/02/19   Mesner, Corene Cornea, MD  montelukast (SINGULAIR) 10 MG tablet Take 1 tablet (10 mg total) by mouth at bedtime. 08/17/18   Martyn Ehrich, NP  nicotine (NICODERM CQ - DOSED IN MG/24 HOURS) 14 mg/24hr patch After completing 21 mg dose for 6 weeks, taper down and place one patch (14 mg) onto skin daily for 2 weeks Patient not taking: Reported on 02/13/2019 08/25/18   Martyn Ehrich, NP  nicotine (NICODERM CQ - DOSED IN MG/24 HOURS) 21 mg/24hr patch Start taper and place one patch onto skin daily for 6 weeks. Patient not taking: Reported on 02/13/2019 08/25/18   Martyn Ehrich, NP  nicotine (NICODERM CQ - DOSED IN MG/24 HR) 7 mg/24hr patch Place 1 patch (7 mg total) onto the skin daily. After completing 14 mg dose for 2 weeks, place one patch onto skin (7 mg)  daily for 2 weeks. Then stop Patient not taking: Reported on 02/13/2019 08/25/18   Martyn Ehrich, NP  omeprazole (PRILOSEC) 40 MG capsule Take 1 capsule (40 mg total) by mouth daily. 12/14/17   [name], MD  potassium chloride SA (K-DUR,KLOR-CON) 20 MEQ tablet Take 20 mEq by mouth daily.    [provider]  predniSONE (DELTASONE) 10 MG tablet Take 4 tablets (40 mg total) by mouth daily. Patient not taking: Reported on 02/14/2019 12/08/18   Quintella Reichert, MD  Respiratory Therapy Supplies (FLUTTER) DEVI Use flutter device 3 times a day 07/20/18   Martyn Ehrich, NP  Tiotropium Bromide Monohydrate (SPIRIVA RESPIMAT) 1.25 MCG/ACT AERS Inhale 2 puffs into the lungs daily. 08/17/18   Martyn Ehrich, NP  tiZANidine (ZANAFLEX) 4 MG tablet Take 4 mg by mouth every 8 (eight) hours as needed for spasms. 02/14/18   [provider]  triamterene-hydrochlorothiazide (MAXZIDE-25) 37.5-25 MG tablet Take 1 tablet by mouth daily. 02/14/18   [provider]  valsartan (DIOVAN) 320 MG tablet Take 1 tablet (320 mg total) by mouth daily. 05/20/18   [name], MD    Family History Family History  Problem Relation Age of Onset   Diabetes Mother    Hypertension Mother    Diabetes Father    Heart disease Paternal Aunt    Cancer Paternal Aunt     Social History Social History   Tobacco Use   Smoking status: Current Every Day Smoker    Packs/day: 1.00    Years: 24.00    Pack years: 24.00    Types: Cigarettes   Smokeless tobacco: Never Used  Substance Use Topics   Alcohol use: No    Alcohol/week: 0.0 standard drinks    Comment: quit Nov. 2014   Drug use: No  Allergies   Aspirin   Review of Systems Review of Systems  Musculoskeletal: Positive for joint swelling. Negative for back pain.  All other systems reviewed and are negative.    Physical Exam Triage Vital Signs ED Triage Vitals  Enc Vitals Group     BP 05/16/19 1138 (!) 152/108      Pulse Rate 05/16/19 1138 86     Resp 05/16/19 1138 20     Temp 05/16/19 1138 98 F (36.7 C)     Temp Source 05/16/19 1138 Temporal     SpO2 05/16/19 1138 98 %     Weight --      Height --      Head Circumference --      Peak Flow --      Pain Score 05/16/19 1139 10     Pain Loc --      Pain Edu? --      Excl. in [place]? --    No data found.  Updated Vital Signs BP (!) 152/108 (BP Location: Left Arm)    Pulse 86    Temp 98 F (36.7 C) (Temporal)    Resp 20    LMP 10/15/2018 Comment: patient had pregnancy test with PCP (test was negative)   SpO2 98%   Visual Acuity Right Eye Distance:   Left Eye Distance:   Bilateral Distance:    Right Eye Near:   Left Eye Near:    Bilateral Near:     Physical Exam Vitals signs and nursing note reviewed.  Constitutional:      Appearance: Normal appearance. She is well-developed.  HENT:     Head: Normocephalic.     Mouth/Throat:     Mouth: Mucous membranes are moist.  Eyes:     Pupils: Pupils are equal, round, and reactive to light.  Neck:     Musculoskeletal: Normal range of motion.  Pulmonary:     Effort: Pulmonary effort is normal.  Abdominal:     General: Abdomen is flat. There is no distension.  Musculoskeletal: Normal range of motion.  Skin:    General: Skin is warm.  Neurological:     Mental Status: She is alert and oriented to person, place, and time.  Psychiatric:        Mood and Affect: Mood normal.      UC Treatments / Results  Labs (all labs ordered are listed, but only abnormal results are displayed) Labs Reviewed - No data to display  EKG   Radiology Dg Shoulder Left  Result Date: 05/16/2019 CLINICAL DATA:  Left shoulder pain, no trauma EXAM: LEFT SHOULDER - 2+ VIEW COMPARISON:  None. FINDINGS: No fracture or dislocation of the left shoulder. There is mild acromioclavicular and glenohumeral arthrosis. There is low lying position of the left humeral head, likely related to a glenohumeral joint effusion. There  are calcifications in the expected vicinity of the rotator cuff insertion on the greater tuberosity of the humerus. Subacromial spur. Partially imaged left chest is unremarkable. IMPRESSION: 1.  No fracture or dislocation of the left shoulder. 2. There is low lying position of the left humeral head, likely related to a large glenohumeral joint effusion. 3. There are calcifications in the expected vicinity of the rotator cuff insertion on the greater tuberosity of the humerus, suggestive of calcific tendinitis. Electronically Signed   By: Eddie Candle M.D.   On: 05/16/2019 12:43    Procedures Procedures (including critical care time)  Medications Ordered in UC Medications - No  data to display  Initial Impression / Assessment and Plan / UC Course  I have reviewed the triage vital signs and the nursing notes.  Pertinent labs & imaging results that were available during my care of the patient were reviewed by me and considered in my medical decision making (see chart for details).     MDM  Pt has decreased range of motion.  Xray shows rotator cuff tendonitis  Pt given a sling.  She is advised to use for comfort but she must take arm aout and move around.  Pt advised to schedule to see Dr. Veverly Fells for evaltuion   Final Clinical Impressions(s) / UC Diagnoses   Final diagnoses:  Rotator cuff tendonitis, left   Discharge Instructions   None    ED Prescriptions    Medication Sig Dispense Auth. Provider   meloxicam (MOBIC) 15 MG tablet Take 1 tablet (15 mg total) by mouth daily as needed for pain. 10 tablet Fransico Meadow, [place]     Controlled Substance Prescriptions [place] Controlled Substance Registry consulted? Not Applicable  An After Visit Summary was printed and given to the patient.    Fransico Meadow, [place] 05/16/19 1358

## 2019-05-16 NOTE — ED Triage Notes (Signed)
Pt states on Sunday pain shot through her arm and L shoulder, pt states she cant raise her arm without severe pain. L shoulder very tender to palpation.

## 2019-06-10 IMAGING — CR DG CHEST 2V
2 series · 2 of 2 positions shown · non-contrast
Comparison: 12/17/2016

CLINICAL DATA: Chest pain and shortness of breath.

EXAM:
CHEST  2 VIEW

[w chest pa]
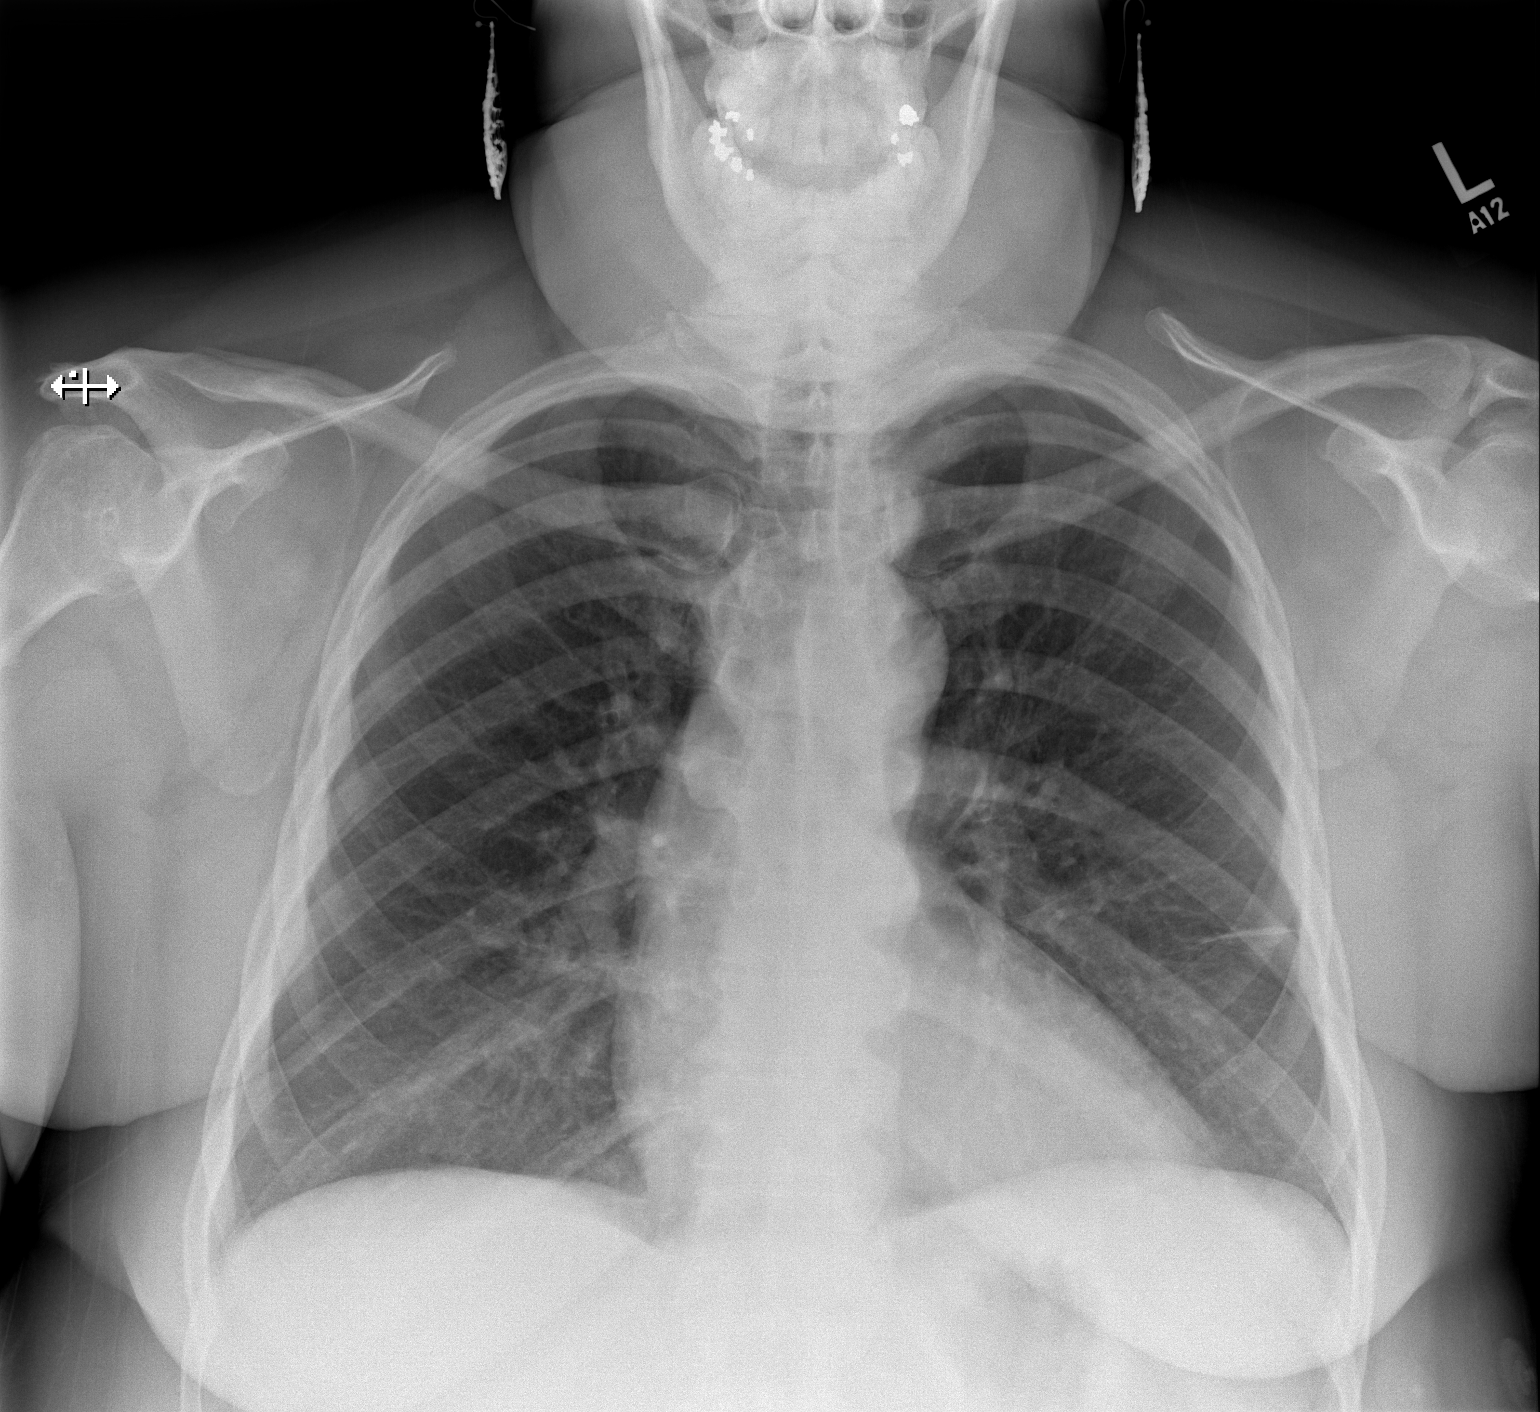

[w chest lat]
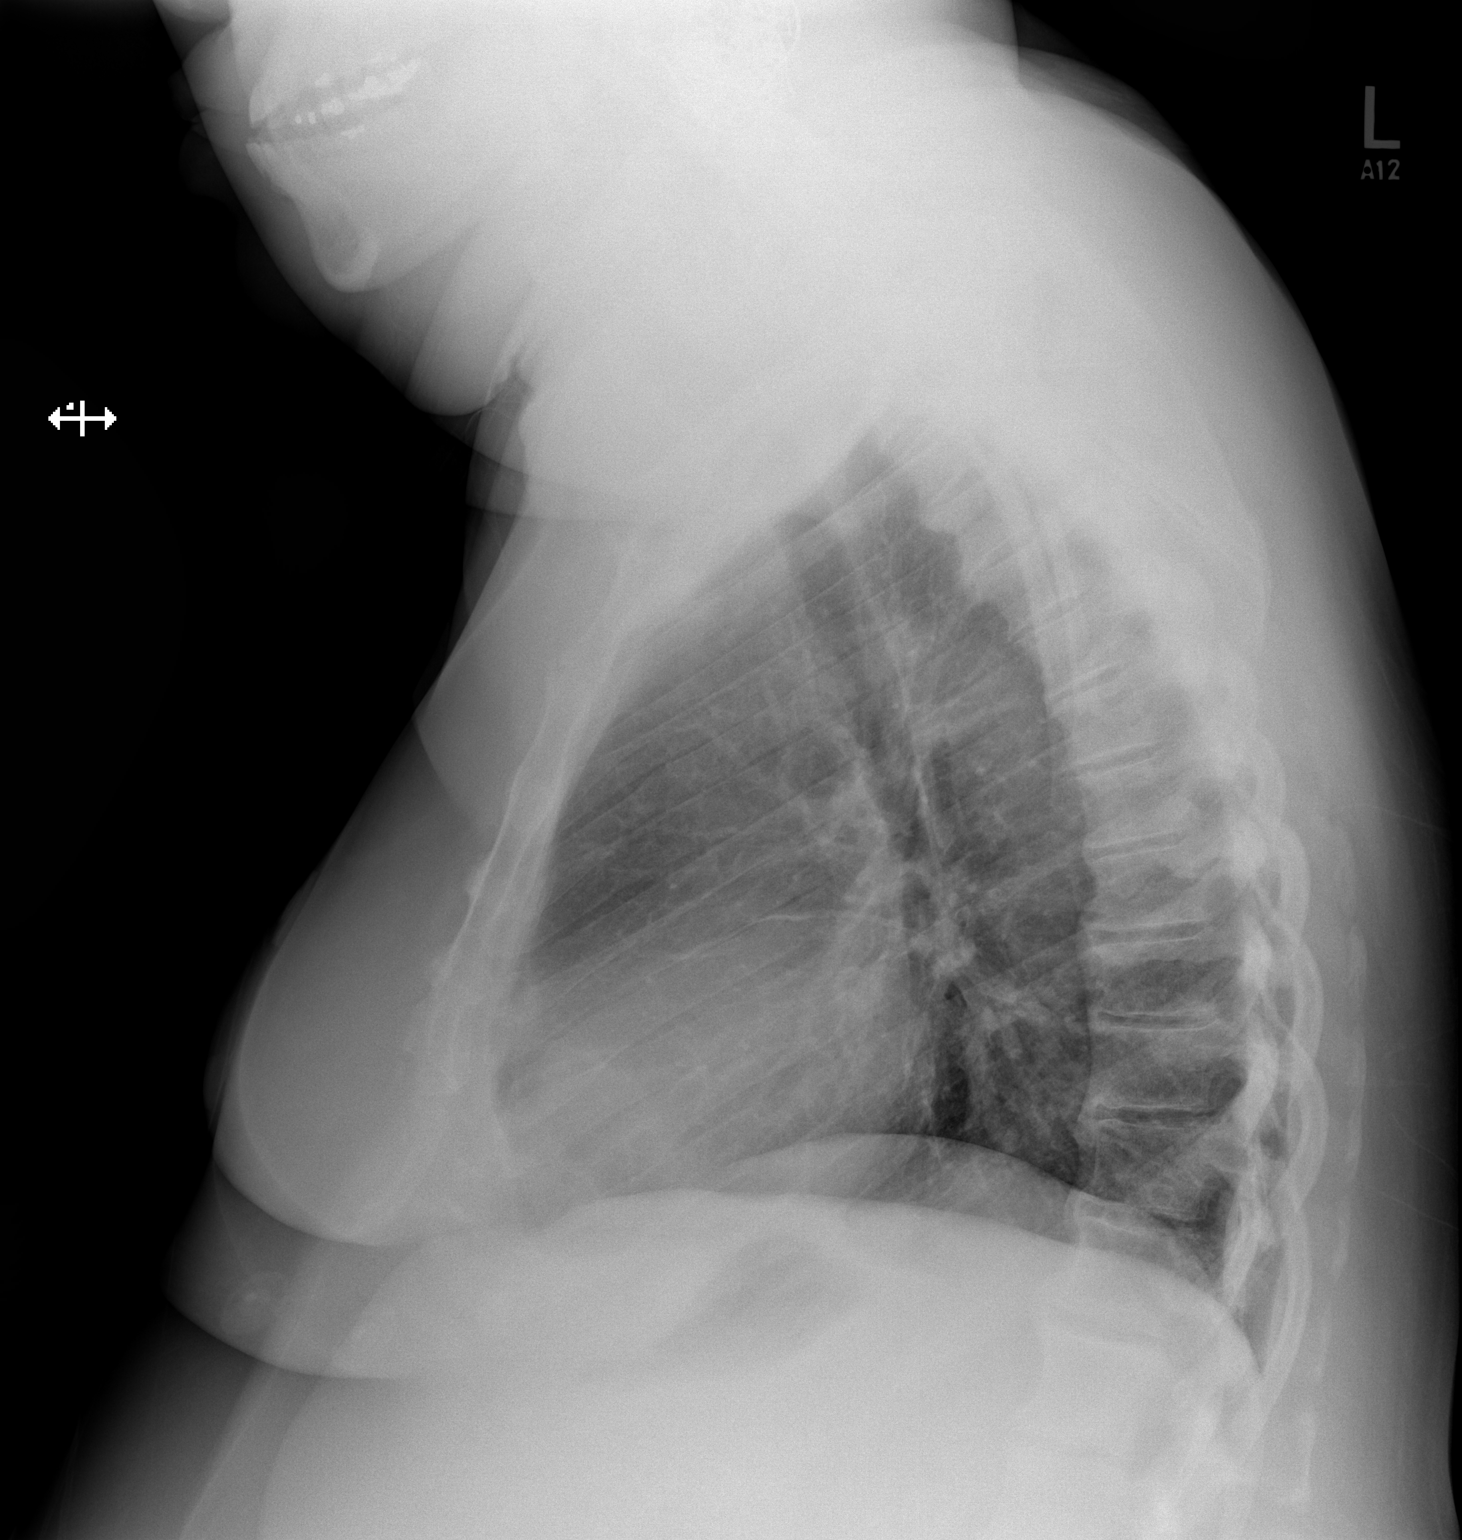

[2 of 2 positions shown; findings below may reference images not displayed]

FINDINGS: Subsegmental atelectasis left midlung. The lungs are clear without
focal pneumonia, edema, pneumothorax or pleural effusion.
Cardiopericardial silhouette is at upper limits of normal for size.
The visualized bony structures of the thorax are intact.
IMPRESSION: No active cardiopulmonary disease.

## 2019-07-09 IMAGING — DX DG CHEST 2V
2 series · 2 of 2 positions shown · non-contrast
Comparison: 05/06/2017.

CLINICAL DATA: Substernal chest pain.

EXAM:
CHEST  2 VIEW

[chest pa]
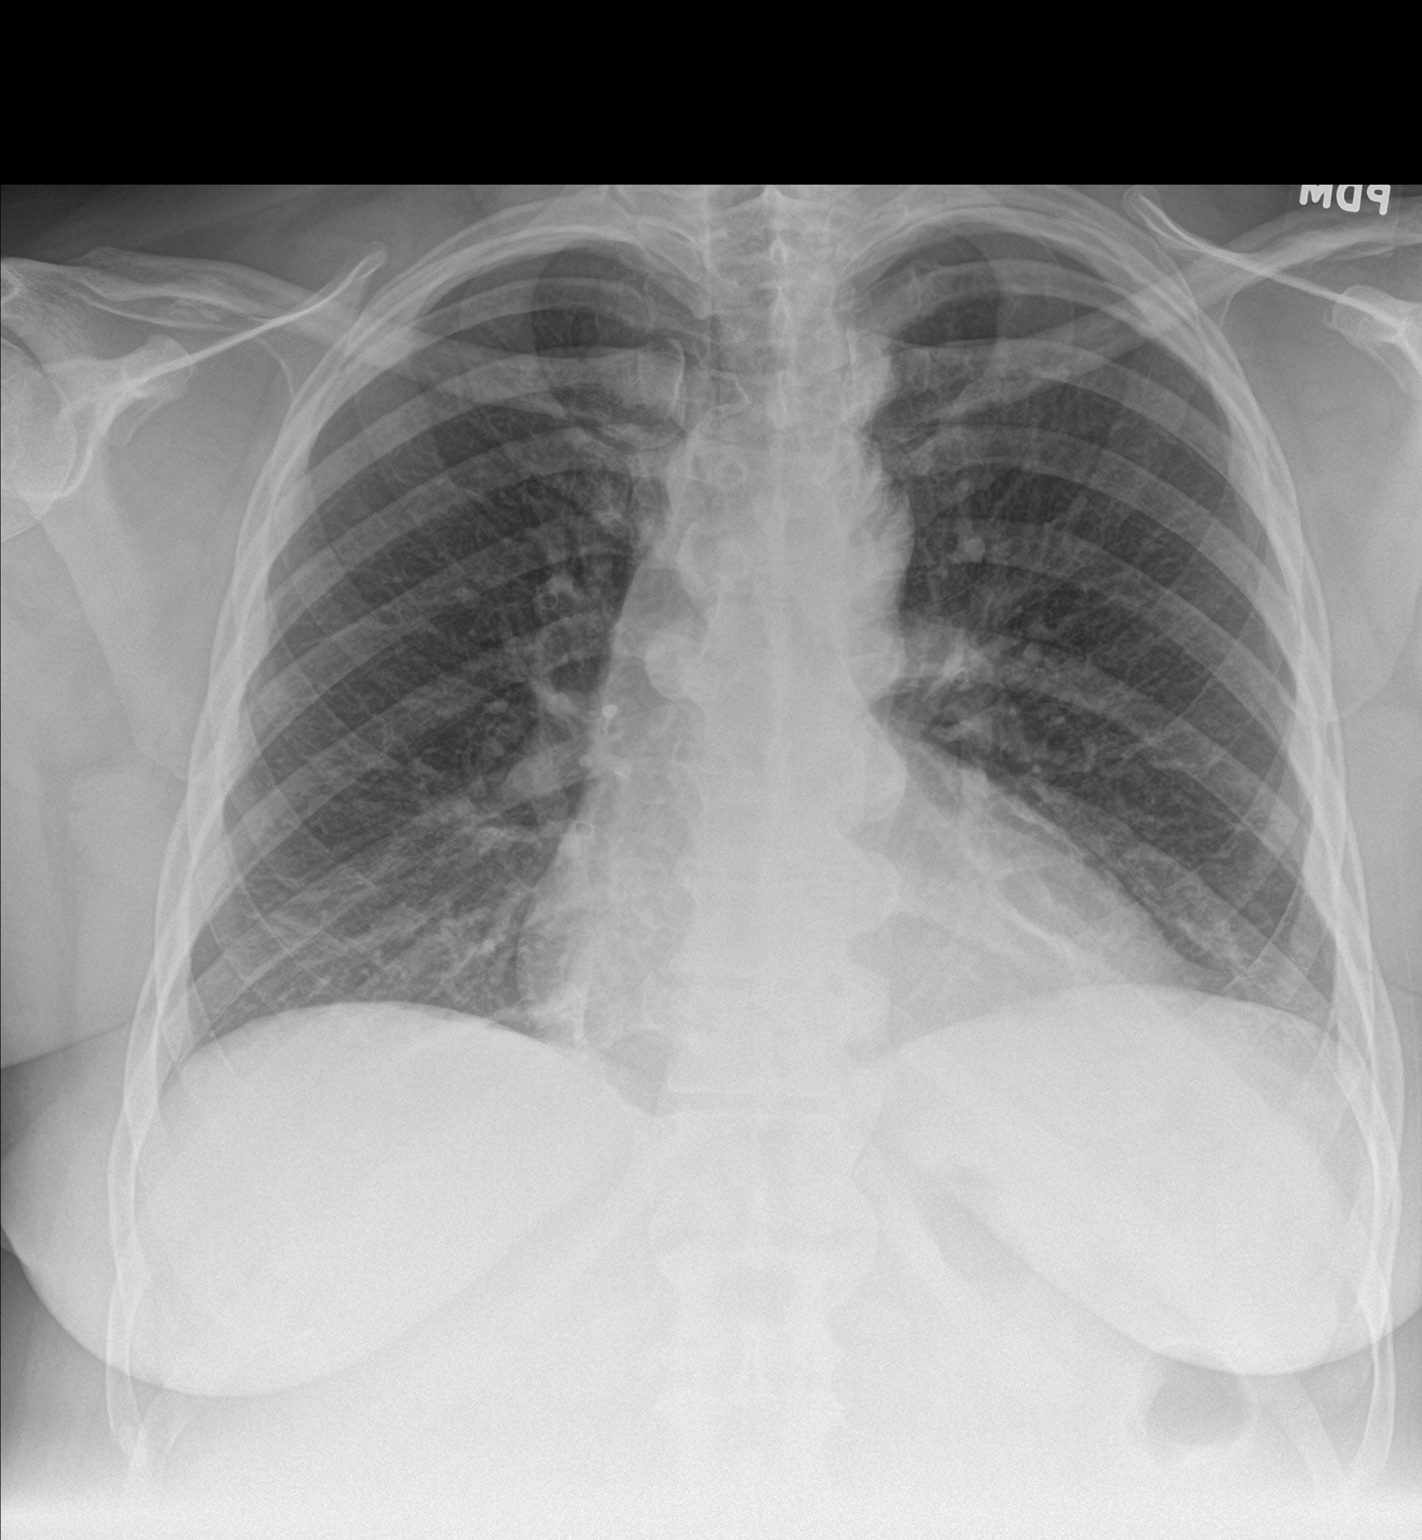

[chest lat]
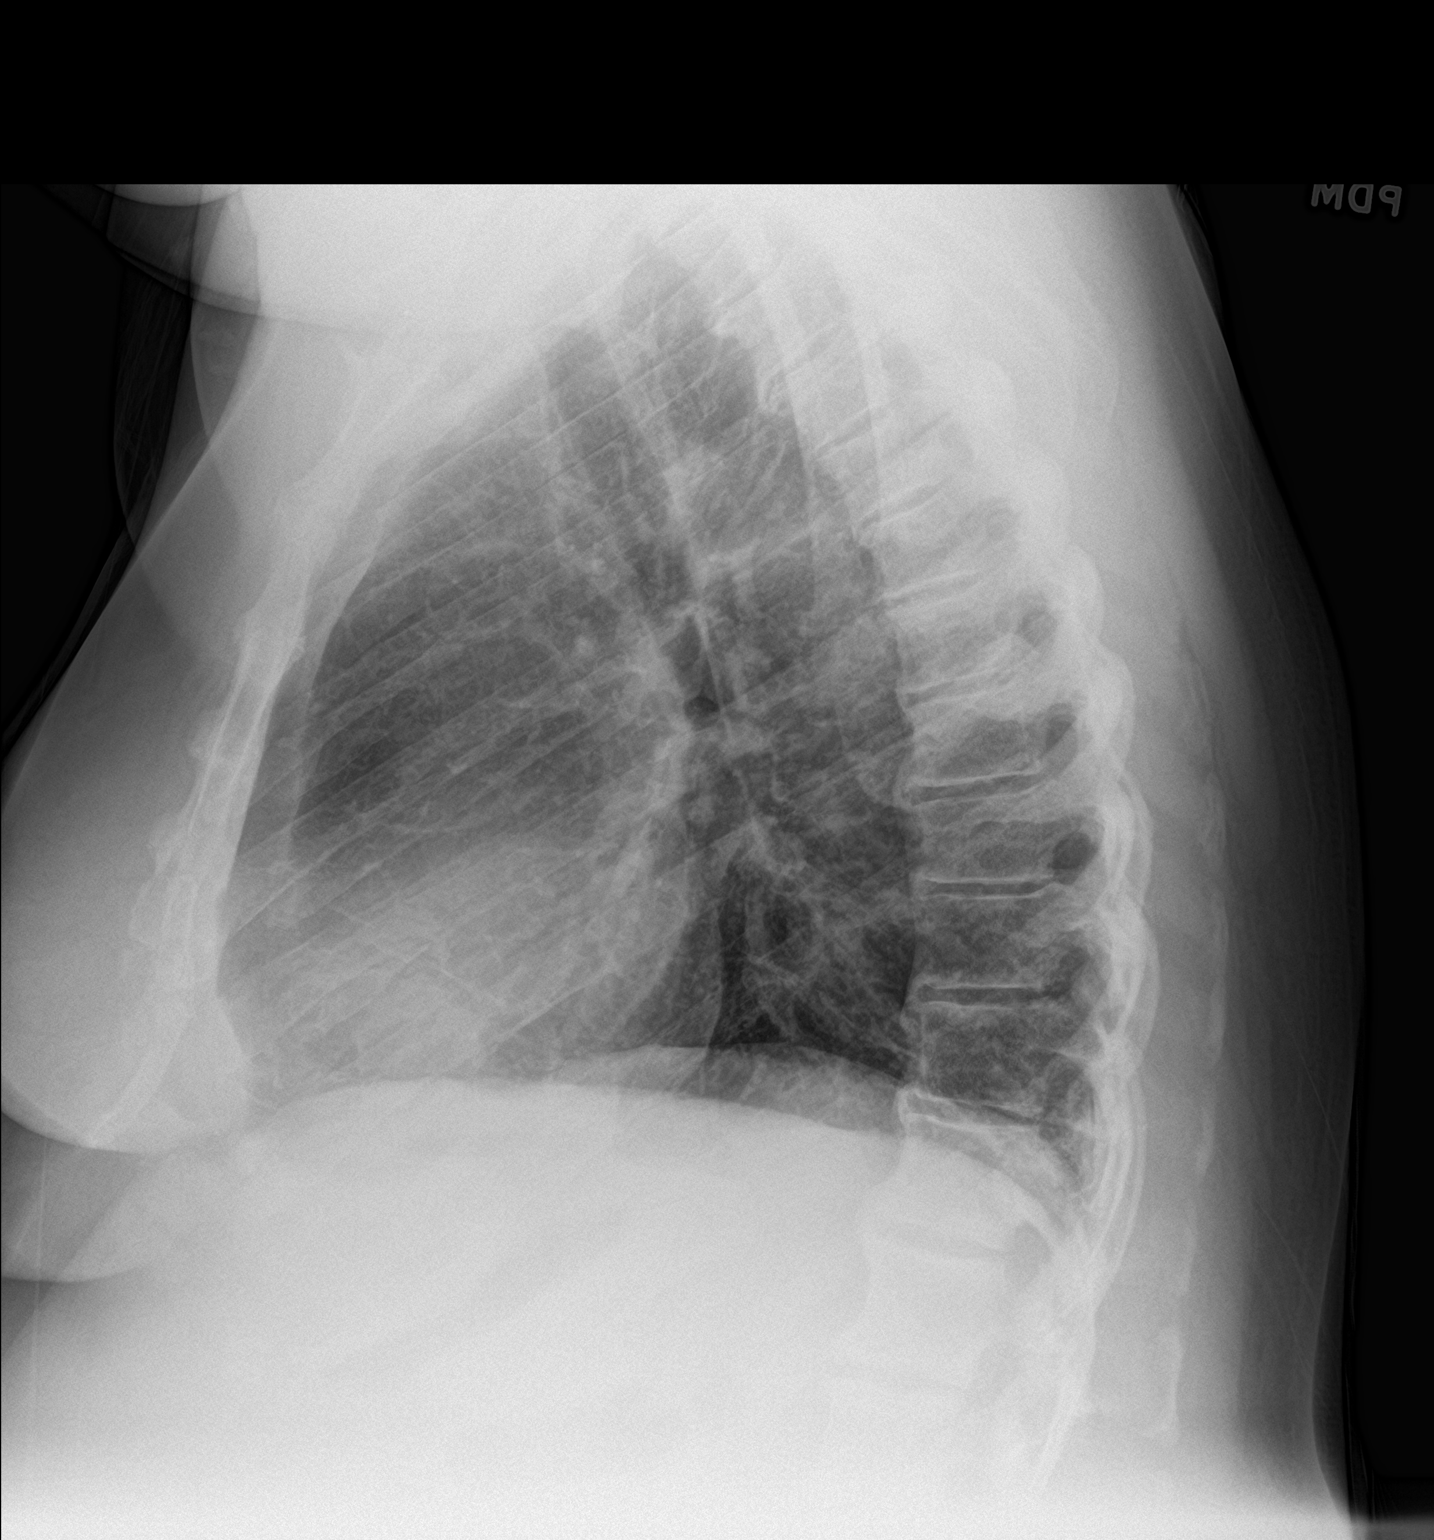

[2 of 2 positions shown; findings below may reference images not displayed]

FINDINGS: Mediastinum and hilar structures normal. Cardiomegaly with mild
prominence of pulmonary vascularity. Mild interstitial prominence.
No pleural effusion or pneumothorax. Mild basilar atelectasis .
Degenerative changes thoracic spine .
IMPRESSION: 1. Cardiomegaly with mild pulmonary vascular and interstitial
prominence. Very mild changes of congestive heart failure cannot be
excluded.

2. Mild basilar atelectasis.

## 2019-08-03 ENCOUNTER — Telehealth: Payer: Self-pay | Admitting: Cardiovascular Disease

## 2019-08-03 NOTE — Telephone Encounter (Signed)
Spoke with pt who has been having intermittent chest pain since yesterday evening. It is not sharp but is mostly on the right side of her chest. She denies N/V or swelling. Pt complains of SOB that has been ongoing for the past two weeks.  No APP availability this week, will route to Dr. Oval Linsey for advisement. Scheduled pt with Kerin Ransom on 11/25 at [facility] [place] pt to go to the ED if CP worsens or other symptoms present. Advised pt to call the office back with any other concerns. She is comfortable waiting for her appt next week to be evaluated.

## 2019-08-03 NOTE — Telephone Encounter (Signed)
° °  Pt c/o of Chest Pain: STAT if CP now or developed within 24 hours  1. Are you having CP right now? yes  2. Are you experiencing any other symptoms (ex. SOB, nausea, vomiting, sweating)? SOB  3. How long have you been experiencing CP? Since last night   4. Is your CP continuous or coming and going? Comes and goes   5. Have you taken Nitroglycerin? no ? Attempted to transfer pt to NL triage but the line had a busy signal

## 2019-08-09 ENCOUNTER — Ambulatory Visit: Payer: Medicaid Other | Admitting: Cardiology

## 2019-08-11 IMAGING — CT CT HEAD W/O CM
3 of 4 series · 16 of 47 positions shown, 19 images · non-contrast
Comparison: 05/06/2017 and prior exams

CLINICAL DATA: 46-year-old female with ataxia and right-sided
facial droop for several days.

EXAM:
CT HEAD WITHOUT CONTRAST
TECHNIQUE: Contiguous axial images were obtained from the base of the skull
through the vertex without intravenous contrast.

[Series 4: head 2.0 h70h · axial · 0.41mm/px · z∈[-150,-12]mm · 10 of 77 slices shown, 13 images]
[im 4/77  brain]
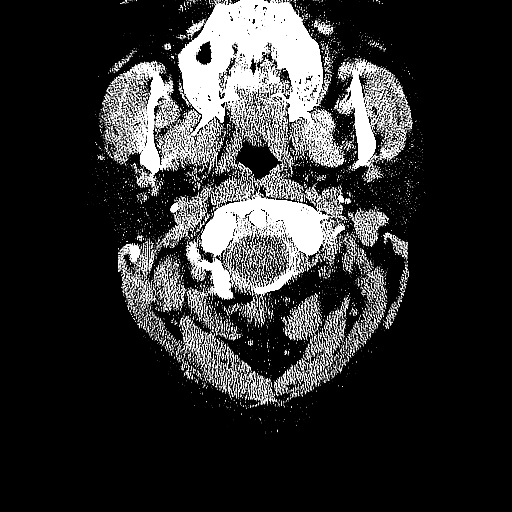
[im 4/77  bone]
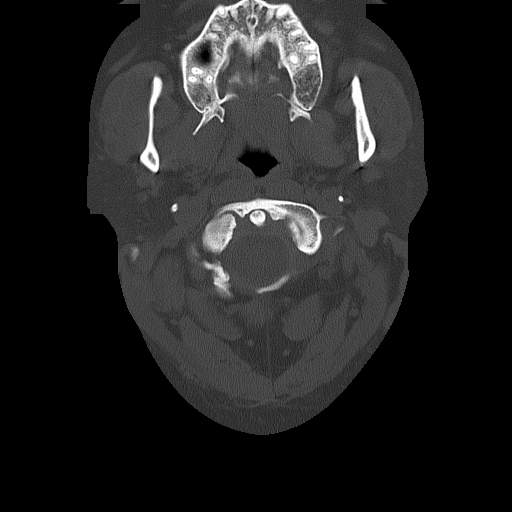
[im 12/77  brain]
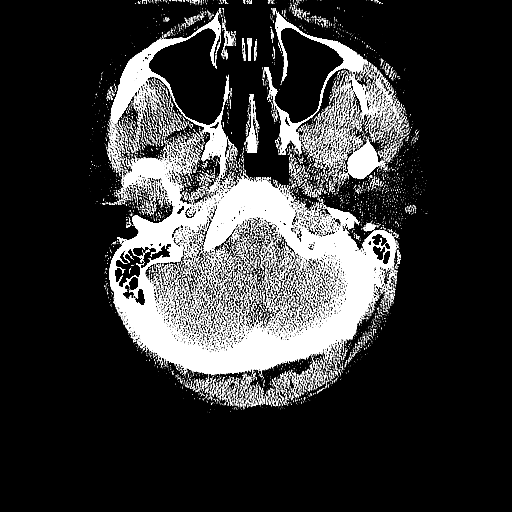
[im 20/77  brain]
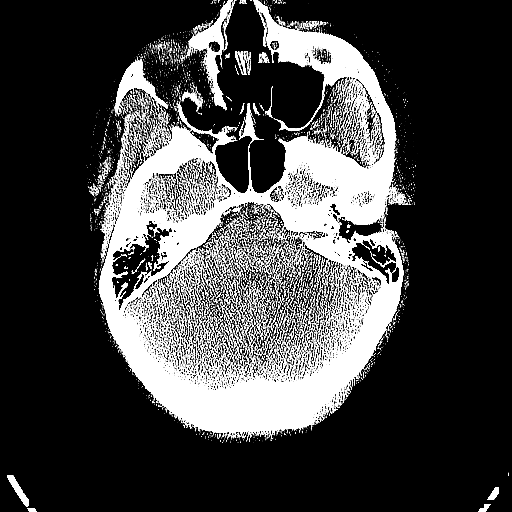
[im 27/77  brain]
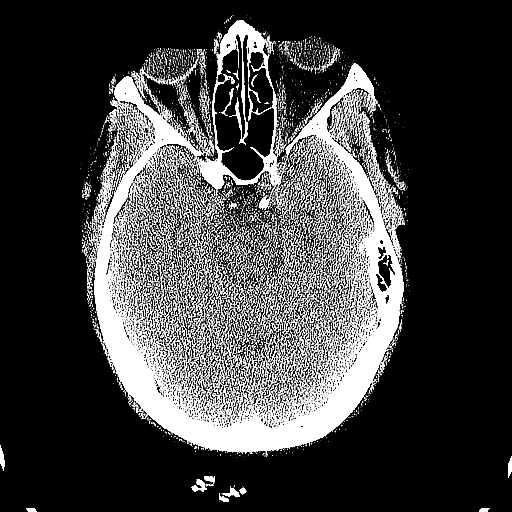
[im 35/77  brain]
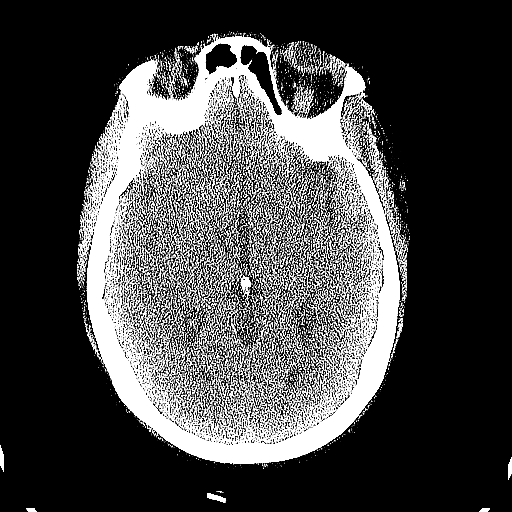
[im 35/77  bone]
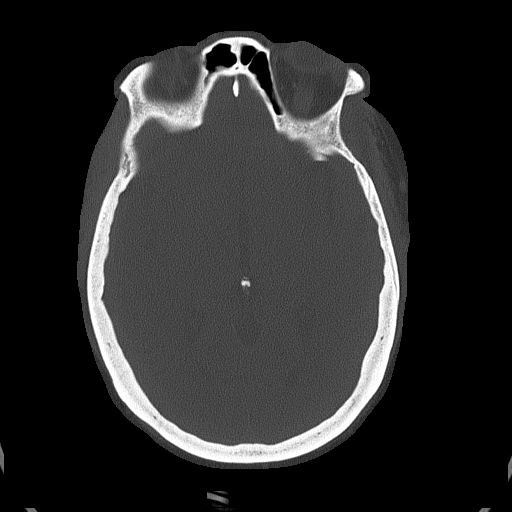
[im 42/77  brain]
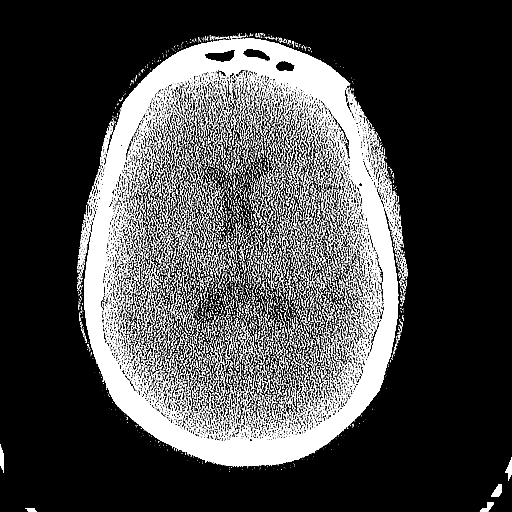
[im 50/77  brain]
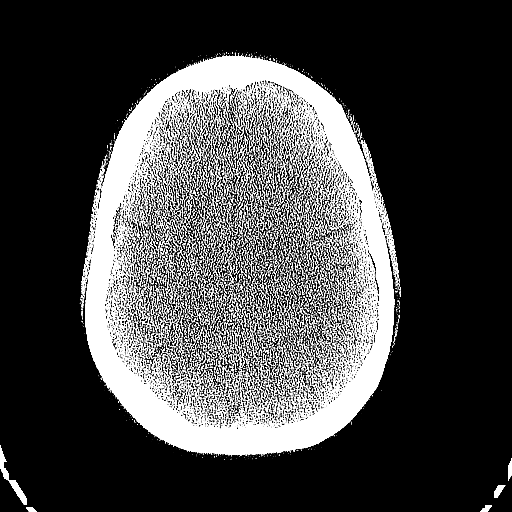
[im 58/77  brain]
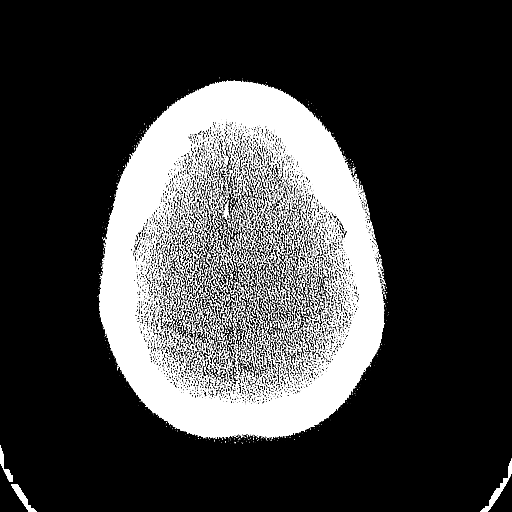
[im 65/77  brain]
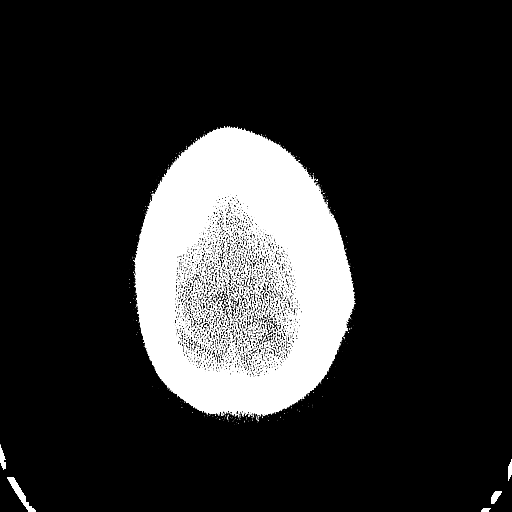
[im 65/77  bone]
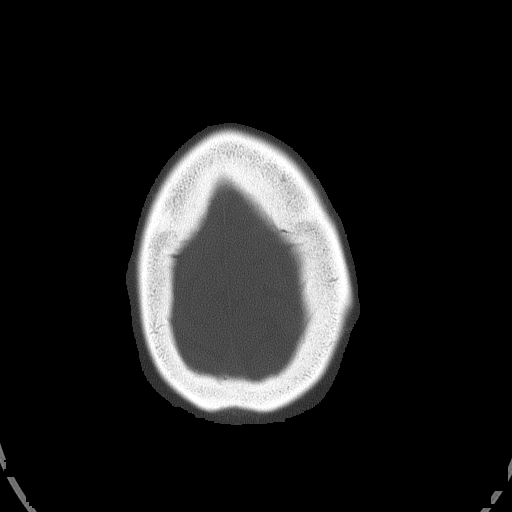
[im 73/77  brain]
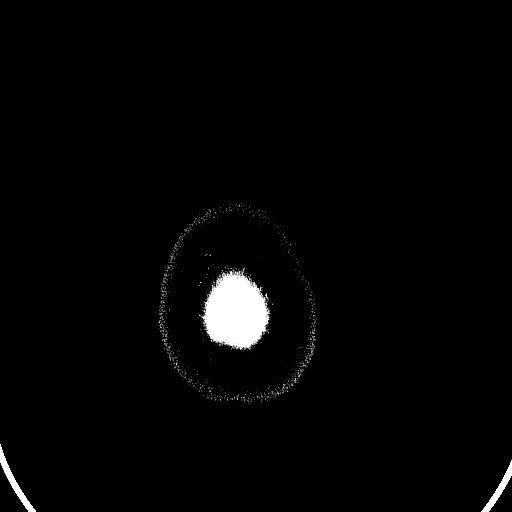

[Series 5: head 3.0 mpr cor · coronal · 0.30mm/px · 3 of 67 slices shown]
[im 23/67  brain]
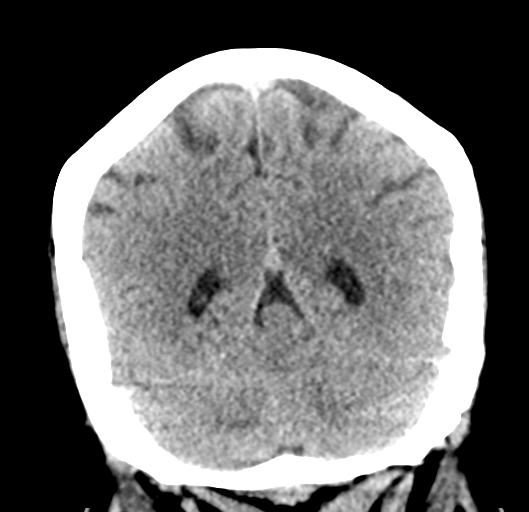
[im 30/67  brain]
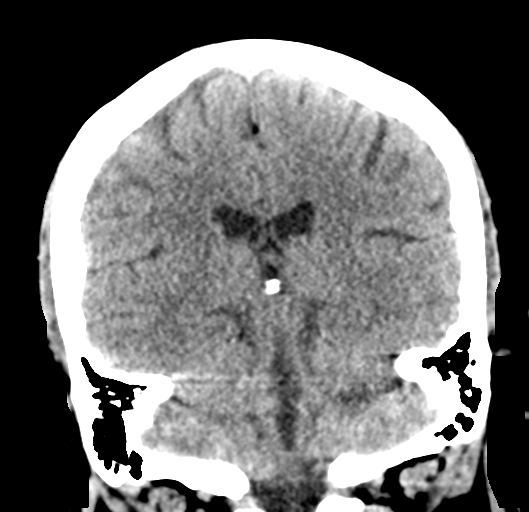
[im 37/67  brain]
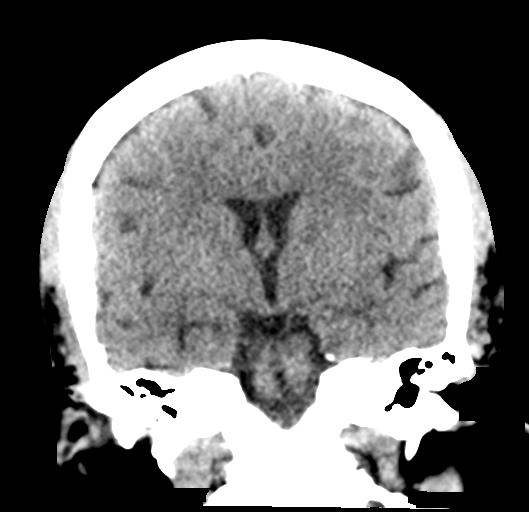

[Series 6: head 3.0 mpr sag · sagittal · 0.30mm/px · 3 of 61 slices shown]
[im 21/61  brain]
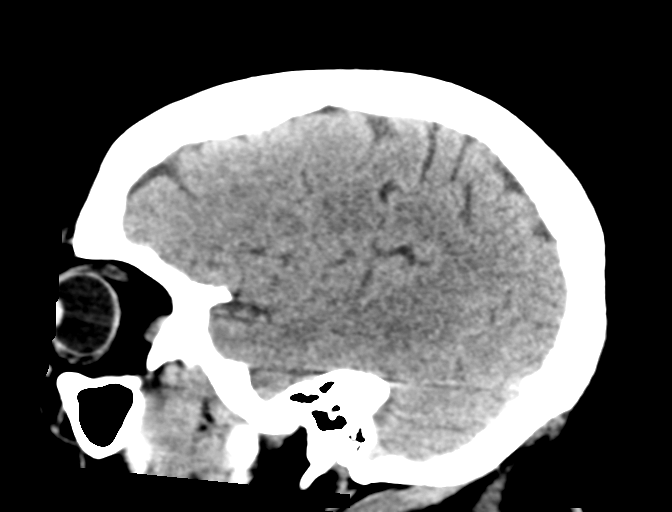
[im 31/61  brain]
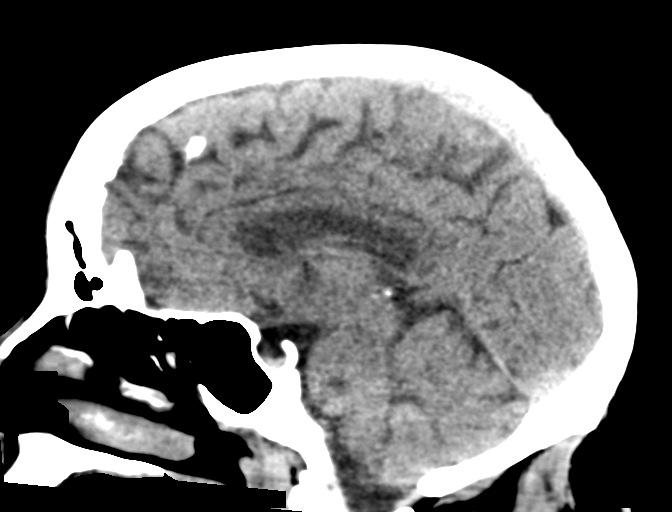
[im 41/61  brain]
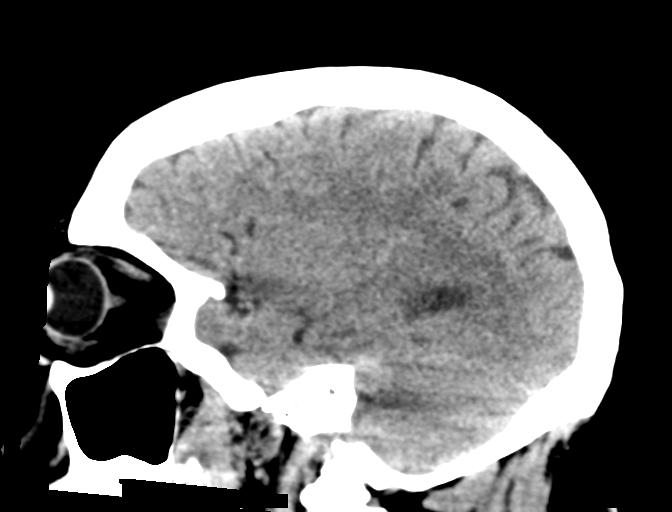

[16 of 47 positions shown; findings below may reference images not displayed]

FINDINGS: Brain: No evidence of acute infarction, hemorrhage, hydrocephalus,
extra-axial collection or mass lesion/mass effect.

Vascular: No hyperdense vessel or unexpected calcification.

Skull: Normal. Negative for fracture or focal lesion. The mandibular
heads bilaterally are anteriorly located, but have a similar
appearance on the prior study and almost certainly represent jaw
position.

Sinuses/Orbits: No acute finding.

Other: None.
IMPRESSION: No evidence of acute intracranial abnormality.

## 2019-08-11 IMAGING — MR MR HEAD W/O CM
11 of 12 series · 40 of 48 positions shown · non-contrast
Comparison: Head CT from earlier today

CLINICAL DATA: Slurred speech. Hypertension. Drooping of the right
face.

EXAM:
MRI HEAD WITHOUT CONTRAST
TECHNIQUE: Multiplanar, multiecho pulse sequences of the brain and surrounding
structures were obtained without intravenous contrast.

[Series 3: DWI · axial · 3.0mm · 0.94mm/px · z∈[-56,+67]mm · 6 of 84 slices shown (1 of 3)]
[im 1/84]
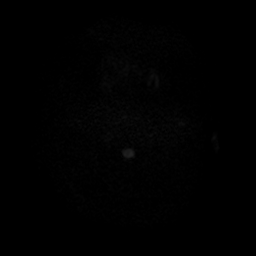
[im 17/84]
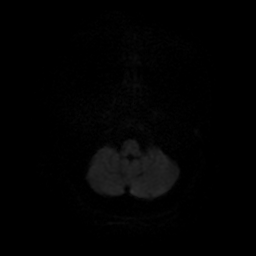
[im 34/84]
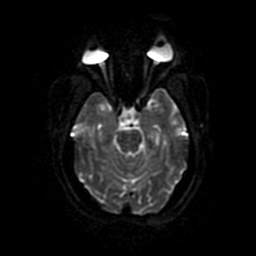
[im 50/84]
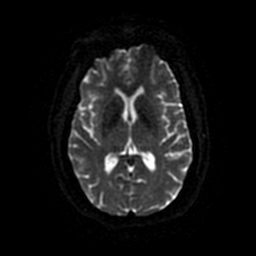
[im 67/84]
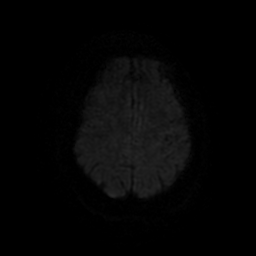
[im 84/84]
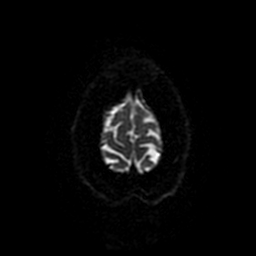

[Series 5: T2 · axial · 5.0mm · 0.47mm/px · z∈[-54,+66]mm · 2 of 21 slices shown (1 of 2)]
[im 1/21]
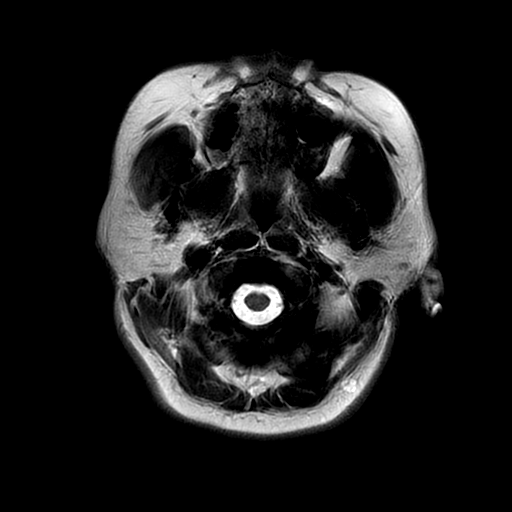
[im 21/21]
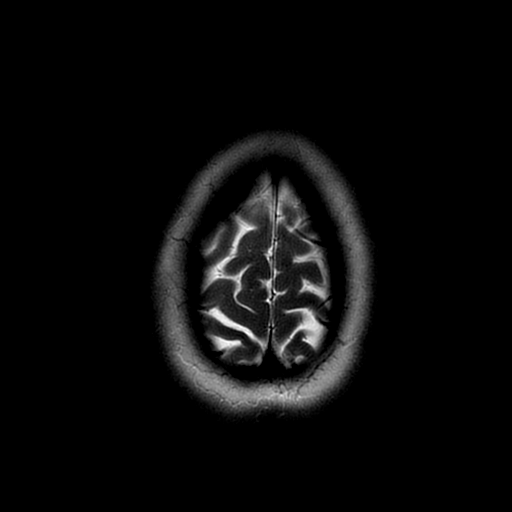

[Series 6: DWI · axial · 3.0mm · 0.94mm/px · z∈[-56,+67]mm · 7 of 83 slices shown (2 of 3)]
[im 1/83]
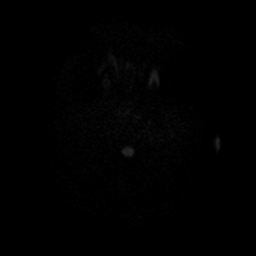
[im 14/83]
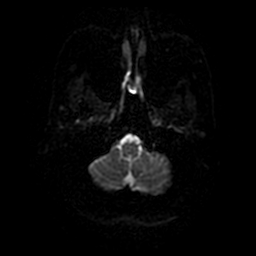
[im 28/83]
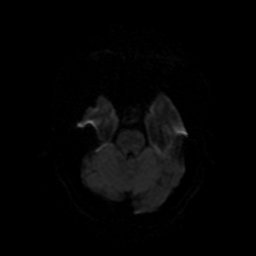
[im 42/83]
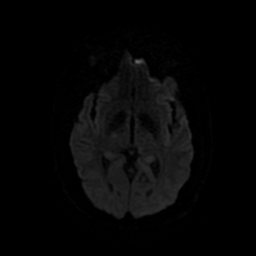
[im 55/83]
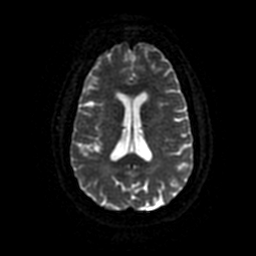
[im 69/83]
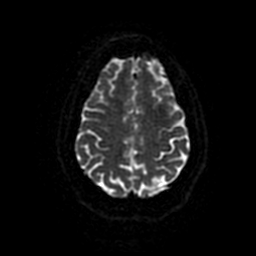
[im 83/83]
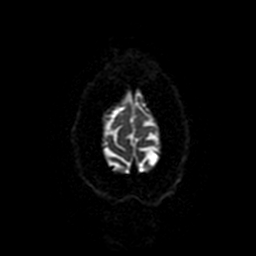

[Series 7: DWI · coronal · 4.0mm · 0.94mm/px · 6 of 66 slices shown (3 of 3)]
[im 1/66]
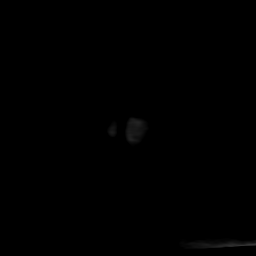
[im 14/66]
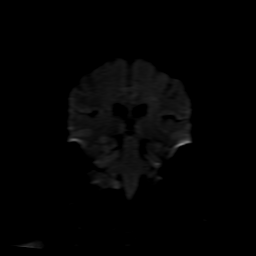
[im 27/66]
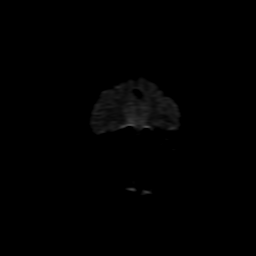
[im 40/66]
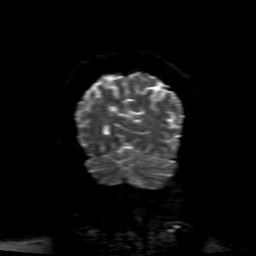
[im 53/66]
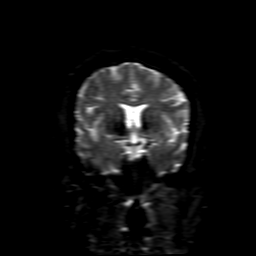
[im 66/66]
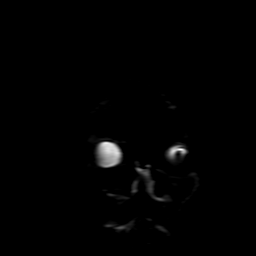

[Series 8: FLAIR · axial · 5.0mm · 0.47mm/px · z∈[-54,+66]mm · 2 of 21 slices shown (1 of 2)]
[im 1/21]
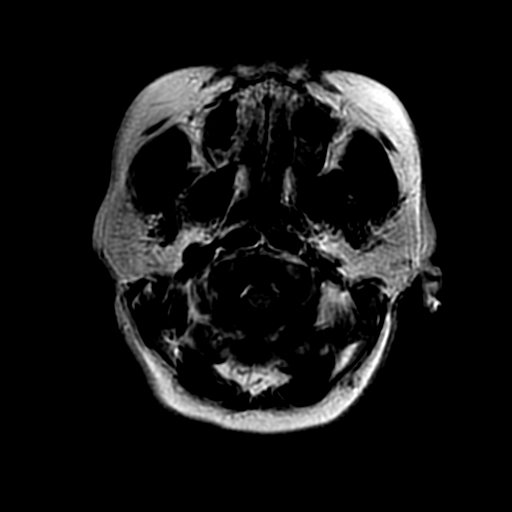
[im 21/21]
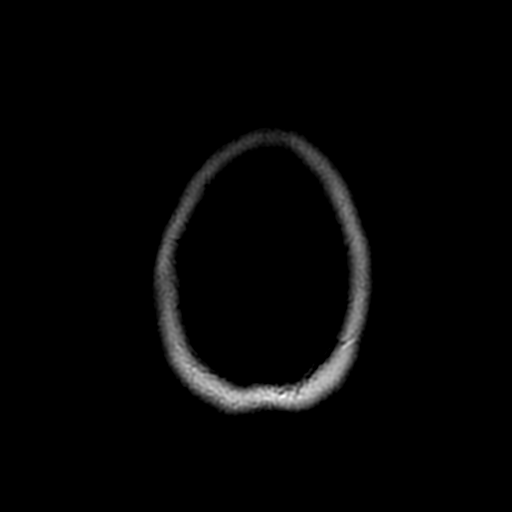

[Series 9: (person_name) · axial · 3.0mm · 0.47mm/px · z∈[-56,-36]mm · 2 of 84 slices shown]
[im 1/84]
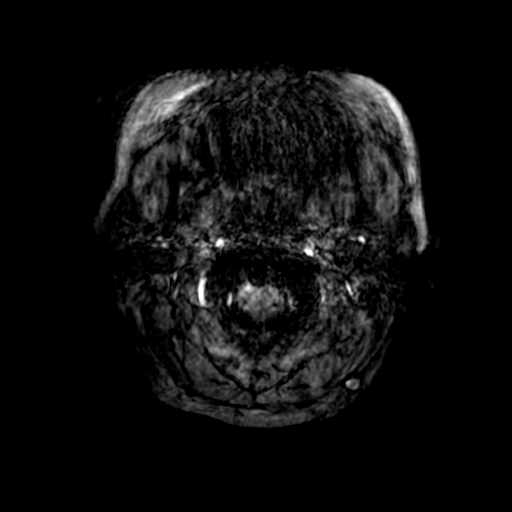
[im 14/84]
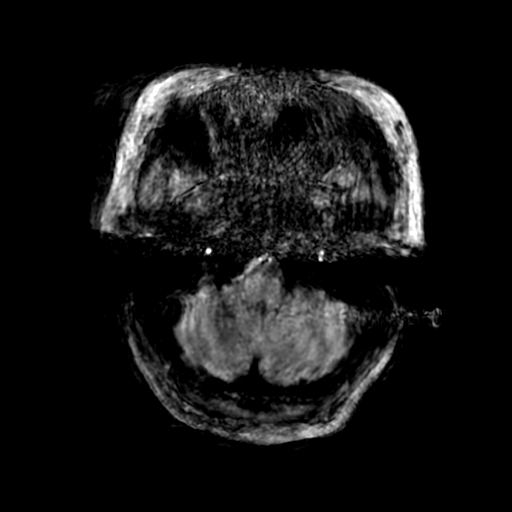

[Series 11: FLAIR · sagittal · 5.0mm · 0.47mm/px · 2 of 21 slices shown (2 of 2)]
[im 1/21]
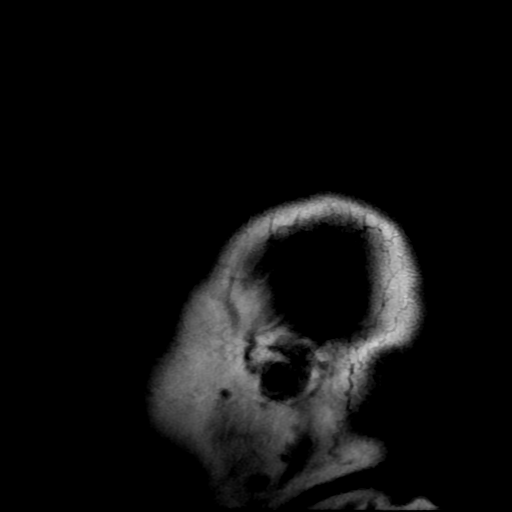
[im 21/21]
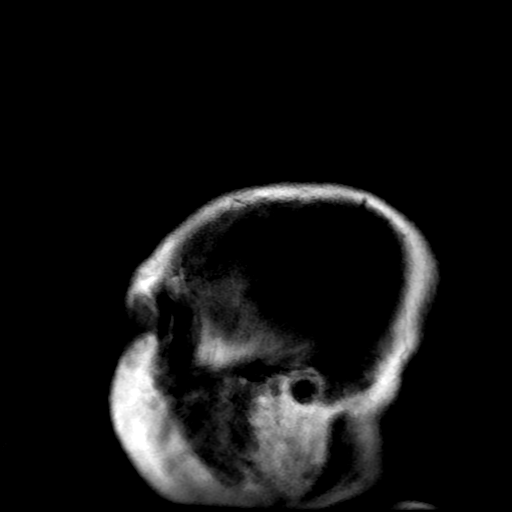

[Series 12: T2 · coronal · 5.0mm · 0.43mm/px · 2 of 28 slices shown (2 of 2)]
[im 1/28]
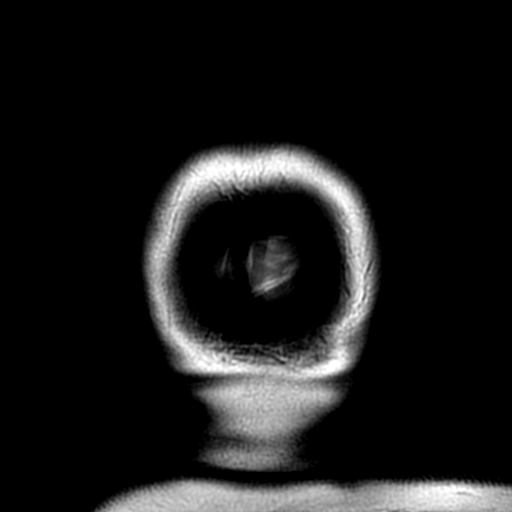
[im 28/28]
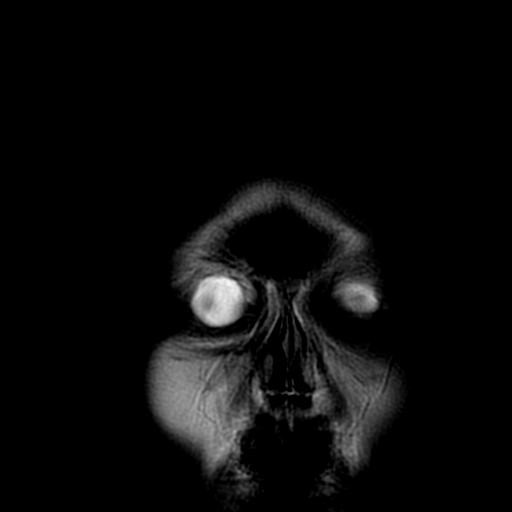

[Series 350: ADC · axial · 3.0mm · 0.94mm/px · z∈[-56,+67]mm · 4 of 42 slices shown (1 of 3)]
[im 1/42]
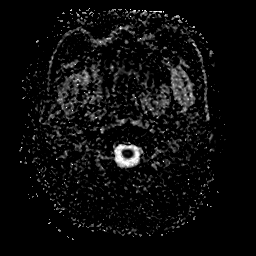
[im 14/42]
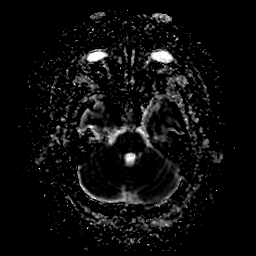
[im 28/42]
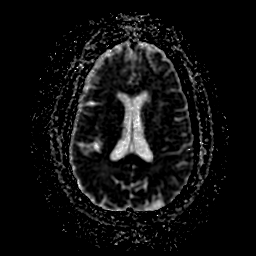
[im 42/42]
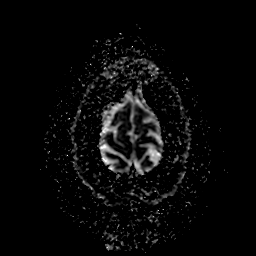

[Series 650: ADC · axial · 3.0mm · 0.94mm/px · z∈[-56,+67]mm · 4 of 42 slices shown (2 of 3)]
[im 1/42]
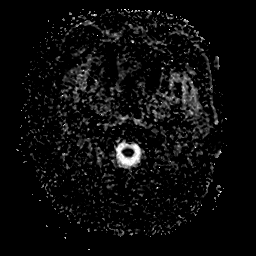
[im 14/42]
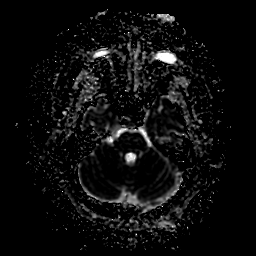
[im 28/42]
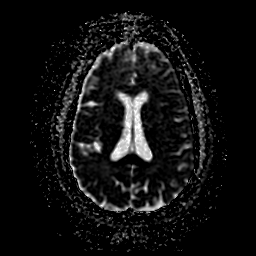
[im 42/42]
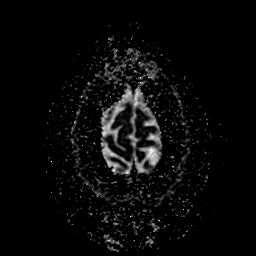

[Series 750: ADC · coronal · 4.0mm · 0.94mm/px · 3 of 33 slices shown (3 of 3)]
[im 1/33]
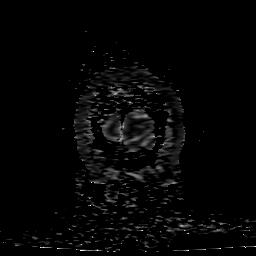
[im 17/33]
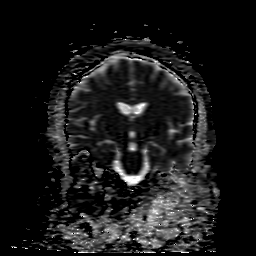
[im 33/33]
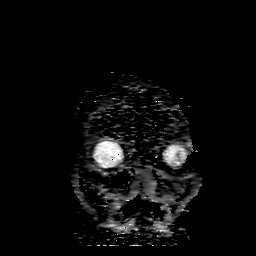

[40 of 48 positions shown; findings below may reference images not displayed]

FINDINGS: Brain: No acute infarction, hemorrhage, hydrocephalus, extra-axial
collection or mass lesion. No atrophy. No definite white matter
disease when accounting for artifact.

Vascular: Major flow voids are preserved

Skull and upper cervical spine: Negative for marrow lesion

Sinuses/Orbits: Negative

Other: Intermittently moderate motion degradation.
IMPRESSION: 1. No acute finding.  No explanation for symptoms.
2. Intermittently moderate motion degradation.

## 2019-08-16 ENCOUNTER — Encounter: Payer: Self-pay | Admitting: Cardiology

## 2019-08-16 ENCOUNTER — Ambulatory Visit: Payer: Medicaid Other | Admitting: Cardiology

## 2019-08-16 ENCOUNTER — Other Ambulatory Visit: Payer: Self-pay

## 2019-08-16 ENCOUNTER — Telehealth: Payer: Self-pay

## 2019-08-16 DIAGNOSIS — I5032 Chronic diastolic (congestive) heart failure: Secondary | ICD-10-CM

## 2019-08-16 DIAGNOSIS — G4733 Obstructive sleep apnea (adult) (pediatric): Secondary | ICD-10-CM | POA: Diagnosis not present

## 2019-08-16 DIAGNOSIS — I890 Lymphedema, not elsewhere classified: Secondary | ICD-10-CM

## 2019-08-16 DIAGNOSIS — J449 Chronic obstructive pulmonary disease, unspecified: Secondary | ICD-10-CM

## 2019-08-16 DIAGNOSIS — J209 Acute bronchitis, unspecified: Secondary | ICD-10-CM

## 2019-08-16 MED ORDER — AZITHROMYCIN 250 MG PO TABS: ORAL_TABLET | ORAL | 0 refills | Status: AC

## 2019-08-16 NOTE — Assessment & Plan Note (Signed)
Pt presents with increased DOE and productive cough x 3-4 weeks.

## 2019-08-16 NOTE — Assessment & Plan Note (Signed)
She is on Maxzide and Lasix 80 mg BID

## 2019-08-16 NOTE — Patient Instructions (Addendum)
Medication Instructions:  START ZPAK  Take 2 tablets for your first dose Today then take 1 tablet daily for 4 (four days) *If you need a refill on your cardiac medications before your next appointment, please call your pharmacy*  Lab Work: Your physician recommends that you return for lab work in: TODAY-BMET, CBC, BNP If you have labs (blood work) drawn today and your tests are completely normal, you will receive your results only by: [name] MyChart Message (if you have MyChart) OR . A paper copy in the mail If you have any lab test that is abnormal or we need to change your treatment, we will call you to review the results.  Testing/Procedures: NONE   Follow-Up: At [facility], you and your health needs are our priority.  As part of our continuing mission to provide you with exceptional heart care, we have created designated Provider Care Teams.  These Care Teams include your primary Cardiologist (physician) and Advanced Practice Providers (APPs -  Physician Assistants and Nurse Practitioners) who all work together to provide you with the care you need, when you need it.  Your next appointment:   2 week(s)  The format for your next appointment:   Virtual Visit   Provider:   Kerin Ransom, PA-C  Other Instructions

## 2019-08-16 NOTE — Telephone Encounter (Signed)
Patient was seen in office today schedule follow up appt as a a virtual visit. Explained to patient how virtual visits work and got consent.         Virtual Visit Pre-Appointment Phone Call  "(Name), I am calling you today to discuss your upcoming appointment. We are currently trying to limit exposure to the virus that causes COVID-19 by seeing patients at home rather than in the office."  1. "What is the BEST phone number to call the day of the visit?" - include this in appointment notes  2. "Do you have or have access to (through a family member/friend) a smartphone with video capability that we can use for your visit?" a. If yes - list this number in appt notes as "cell" (if different from BEST phone #) and list the appointment type as a VIDEO visit in appointment notes b. If no - list the appointment type as a PHONE visit in appointment notes  3. Confirm consent - "In the setting of the current Covid19 crisis, you are scheduled for a (phone or video) visit with your provider on (date) at (time).  Just as we do with many in-office visits, in order for you to participate in this visit, we must obtain consent.  If you'd like, I can send this to your mychart (if signed up) or email for you to review.  Otherwise, I can obtain your verbal consent now.  All virtual visits are billed to your insurance company just like a normal visit would be.  By agreeing to a virtual visit, we'd like you to understand that the technology does not allow for your provider to perform an examination, and thus may limit your provider's ability to fully assess your condition. If your provider identifies any concerns that need to be evaluated in person, we will make arrangements to do so.  Finally, though the technology is pretty good, we cannot assure that it will always work on either your or our end, and in the setting of a video visit, we may have to convert it to a phone-only visit.  In either situation, we cannot  ensure that we have a secure connection.  Are you willing to proceed?" STAFF: Did the patient verbally acknowledge consent to telehealth visit? Document YES/NO here: yes  4. Advise patient to be prepared - "Two hours prior to your appointment, go ahead and check your blood pressure, pulse, oxygen saturation, and your weight (if you have the equipment to check those) and write them all down. When your visit starts, your provider will ask you for this information. If you have an Apple Watch or Kardia device, please plan to have heart rate information ready on the day of your appointment. Please have a pen and paper handy nearby the day of the visit as well."  5. Give patient instructions for MyChart download to smartphone OR Doximity/Doxy.me as below if video visit (depending on what platform provider is using)  6. Inform patient they will receive a phone call 15 minutes prior to their appointment time (may be from unknown caller ID) so they should be prepared to answer    Lori Bean has been deemed a candidate for a follow-up tele-health visit to limit community exposure during the Covid-19 pandemic. I spoke with the patient via phone to ensure availability of phone/video source, confirm preferred email & phone number, and discuss instructions and expectations.  I reminded Lori Bean to be prepared with any vital  sign and/or heart rhythm information that could potentially be obtained via home monitoring, at the time of her visit. I reminded Lori Bean to expect a phone call prior to her visit.  Harold Hedge, CMA 08/16/2019 9:20 AM   INSTRUCTIONS FOR DOWNLOADING THE MYCHART APP TO SMARTPHONE  - The patient must first make sure to have activated MyChart and know their login information - If Apple, go to [employer] and type in MyChart in the search bar and download the app. If Android, ask patient to go to [employer] and type in [place] in  the search bar and download the app. The app is free but as with any other app downloads, their phone may require them to verify saved payment information or Apple/Android password.  - The patient will need to then log into the app with their MyChart username and password, and select [facility] as their healthcare provider to link the account. When it is time for your visit, go to the MyChart app, find appointments, and click Begin Video Visit. Be sure to Select Allow for your device to access the Microphone and Camera for your visit. You will then be connected, and your provider will be with you shortly.  **If they have any issues connecting, or need assistance please contact MyChart service desk [phone])**  **If using a computer, in order to ensure the best quality for their visit they will need to use either of the following Internet Browsers: [employer], or Google Chrome**  IF USING DOXIMITY or DOXY.ME - The patient will receive a link just prior to their visit by text.     FULL LENGTH CONSENT FOR TELE-HEALTH VISIT   I hereby voluntarily request, consent and authorize [facility] and its employed or contracted physicians, physician assistants, nurse practitioners or other licensed health care professionals (the Practitioner), to provide me with telemedicine health care services (the "Services") as deemed necessary by the treating Practitioner. I acknowledge and consent to receive the Services by the Practitioner via telemedicine. I understand that the telemedicine visit will involve communicating with the Practitioner through live audiovisual communication technology and the disclosure of certain medical information by electronic transmission. I acknowledge that I have been given the opportunity to request an in-person assessment or other available alternative prior to the telemedicine visit and am voluntarily participating in the telemedicine visit.  I understand that  I have the right to withhold or withdraw my consent to the use of telemedicine in the course of my care at any time, without affecting my right to future care or treatment, and that the Practitioner or I may terminate the telemedicine visit at any time. I understand that I have the right to inspect all information obtained and/or recorded in the course of the telemedicine visit and may receive copies of available information for a reasonable fee.  I understand that some of the potential risks of receiving the Services via telemedicine include:  [name] Delay or interruption in medical evaluation due to technological equipment failure or disruption; . Information transmitted may not be sufficient (e.g. poor resolution of images) to allow for appropriate medical decision making by the Practitioner; and/or  . In rare instances, security protocols could fail, causing a breach of personal health information.  Furthermore, I acknowledge that it is my responsibility to provide information about my medical history, conditions and care that is complete and accurate to the best of my ability. I acknowledge that Practitioner's advice, recommendations, and/or decision  may be based on factors not within their control, such as incomplete or inaccurate data provided by me or distortions of diagnostic images or specimens that may result from electronic transmissions. I understand that the practice of medicine is not an exact science and that Practitioner makes no warranties or guarantees regarding treatment outcomes. I acknowledge that I will receive a copy of this consent concurrently upon execution via email to the email address I last provided but may also request a printed copy by calling the office of [facility].    I understand that my insurance will be billed for this visit.   I have read or had this consent read to me. . I understand the contents of this consent, which adequately explains the benefits and risks of  the Services being provided via telemedicine.  . I have been provided ample opportunity to ask questions regarding this consent and the Services and have had my questions answered to my satisfaction. . I give my informed consent for the services to be provided through the use of telemedicine in my medical care  By participating in this telemedicine visit I agree to the above.[name]

## 2019-08-16 NOTE — Assessment & Plan Note (Signed)
On C-pap 

## 2019-08-16 NOTE — Progress Notes (Signed)
Cardiology Office Note:    Date:  08/16/2019   ID:  Lori Bean, DOB Jul 19, 1971, MRN [id number]  PCP:  [name], PA  Cardiologist:  [name], MD  Electrophysiologist:  None   Referring MD: [name], PA   CC:  SOB, cough  History of Present Illness:    Lori Bean is a pleasant 48 y.o. female with a hx of past diastolic heart failure, though her most recent echo from August 2019 showed an EF of 60 to 65% with mild LVH and no significant diastolic dysfunction.  She has super morbid obesity with a BMI of 50.  She has sleep apnea and is on CPAP.  She has asthmatic COPD and is a smoker.  Dr. [name] follows her.  She has chronic lymphedema and is on high-dose diuretics.  In February 2020 she was diagnosed with tongue cancer, she is followed by Dr. Tamela Oddi at [facility].  The patient is seen in the office today with complaints of increasing shortness of breath.  She also has a productive cough with thick sputum.  She denies orthopnea.  She is using her CPAP at night.  Her weight is actually almost the same as her August 2020 office visit weight, though her weight has gradulally increased about 30 lbs since December 2019.[name]  She has not had a fever or chills.  Her O2 level on room air in the office today is 97%.  Past Medical History:  Diagnosis Date  . Abdominal hernia   . Anginal pain ([place])    occ from asthma  . Anxiety   . Arthritis   . Asthma   . Bipolar affective disorder ([place])    Schizophrenia  . Bronchitis    hx of  . CHF (congestive heart failure) ([place])   . Depression   . GERD (gastroesophageal reflux disease)   . Heart murmur   . Hypertension    takes meds  . Lymphedema 06/07/2018  . Morbid (severe) obesity due to excess calories ([place])   . Neuropathy    left leg , "from back surgery"  . Overactive bladder   . Schizophrenia ([place])   . Sciatica   . Shortness of breath   . Sleep apnea   . Snoring disorder    Pt stated my  boyfriend always wakes me up and tells me to breathe    Past Surgical History:  Procedure Laterality Date  . BACK SURGERY     3 back surgeries  . CHOLECYSTECTOMY    . COLONOSCOPY WITH PROPOFOL N/A 01/23/2016   Procedure: COLONOSCOPY WITH PROPOFOL;  Surgeon: Wonda Horner, MD;  Location: [facility];  Service: Endoscopy;  Laterality: N/A;  . EYE SURGERY     Metal plate in right eye. Had fracture in right eye  . gallstones reomved    . KNEE ARTHROSCOPY WITH MENISCAL REPAIR Right 09/28/2016   Procedure: KNEE ARTHROSCOPY WITH MENISCAL REPAIR;  Surgeon: Dorna Leitz, MD;  Location: [facility];  Service: Orthopedics;  Laterality: Right;  Right partial meniscectomy and chondroplasty, patellar/femoral joint and medial femoral condyle   . LUMBAR LAMINECTOMY/DECOMPRESSION MICRODISCECTOMY  04/01/2012   Procedure: LUMBAR LAMINECTOMY/DECOMPRESSION MICRODISCECTOMY 2 LEVELS;  Surgeon: Faythe Ghee, MD;  Location: [facility];  Service: Neurosurgery;  Laterality: Left;  Lumbar four-five, lumbar five sacral one microdiscectomy   . LUMBAR WOUND DEBRIDEMENT  04/29/2012   Procedure: LUMBAR WOUND DEBRIDEMENT;  Surgeon: Faythe Ghee, MD;  Location: [facility];  Service: Neurosurgery;  Laterality: N/A;  lumbar wound debridement  . ROTATOR CUFF REPAIR     Right shoulder  . TUBAL LIGATION      Current Medications: Current Meds  Medication Sig  . albuterol (PROAIR HFA) 108 (90 Base) MCG/ACT inhaler Inhale 2 puffs into the lungs every 4 (four) hours as needed for wheezing or shortness of breath.  [name] albuterol (PROVENTIL) (2.5 MG/3ML) 0.083% nebulizer solution Take 2.5 mg by nebulization every 4 (four) hours as needed for wheezing or shortness of breath.  [name] amitriptyline (ELAVIL) 25 MG tablet Take 25 mg by mouth at bedtime.  [name] amLODipine (NORVASC) 10 MG tablet Take 1 tablet (10 mg total) by mouth daily.  . ARIPiprazole (ABILIFY) 20 MG tablet Take 1 tablet (20 mg total) by mouth at bedtime. (Patient taking  differently: Take 20 mg by mouth 2 (two) times daily. )  . bisoprolol (ZEBETA) 5 MG tablet One twice daily  . budesonide-formoterol (SYMBICORT) 160-4.5 MCG/ACT inhaler Inhale 2 puffs into the lungs 2 (two) times daily.  . cetirizine (ZYRTEC) 10 MG tablet Take 1 tablet (10 mg total) by mouth daily.  . DULoxetine (CYMBALTA) 30 MG capsule Take 30 mg by mouth 2 (two) times daily.  . furosemide (LASIX) 80 MG tablet Take 1 tablet (80 mg total) by mouth 2 (two) times daily.  [name] gabapentin (NEURONTIN) 300 MG capsule Take 1 capsule by mouth 3 (three) times daily.  [name] HYDROcodone-acetaminophen (NORCO/VICODIN) 5-325 MG tablet Take 1 tablet by mouth as needed.  . meloxicam (MOBIC) 15 MG tablet Take 1 tablet (15 mg total) by mouth daily as needed for pain.  . methocarbamol (ROBAXIN) 500 MG tablet methocarbamol 500 mg tablet  Take 1 tablet(s) every 8 hours by oral route.  . montelukast (SINGULAIR) 10 MG tablet Take 1 tablet (10 mg total) by mouth at bedtime.  [name] omeprazole (PRILOSEC) 40 MG capsule Take 1 capsule (40 mg total) by mouth daily.  . potassium chloride SA (K-DUR,KLOR-CON) 20 MEQ tablet Take 20 mEq by mouth daily.  [name] Respiratory Therapy Supplies (FLUTTER) DEVI Use flutter device 3 times a day  . tiZANidine (ZANAFLEX) 4 MG tablet Take 4 mg by mouth every 8 (eight) hours as needed for spasms.  [name] triamterene-hydrochlorothiazide (MAXZIDE-25) 37.5-25 MG tablet Take 1 tablet by mouth daily.  . valsartan (DIOVAN) 320 MG tablet Take 1 tablet (320 mg total) by mouth daily.     Allergies:   Aspirin   Social History   Socioeconomic History  . Marital status: Divorced    Spouse name: Not on file  . Number of children: 2  . Years of education: 21  . Highest education level: Not on file  Occupational History    Comment: disabled  Social Needs  . Financial resource strain: Not on file  . Food insecurity    Worry: Not on file    Inability: Not on file  . Transportation needs    Medical: Not on file     Non-medical: Not on file  Tobacco Use  . Smoking status: Current Every Day Smoker    Packs/day: 1.00    Years: 24.00    Pack years: 24.00    Types: Cigarettes  . Smokeless tobacco: Never Used  Substance and Sexual Activity  . Alcohol use: No    Alcohol/week: 0.0 standard drinks    Comment: quit Nov. 2014  . Drug use: No  . Sexual activity: Yes    Birth control/protection: Surgical  Lifestyle  . Physical activity  Days per week: Not on file    Minutes per session: Not on file  . Stress: Not on file  Relationships  . Social Herbalist on phone: Not on file    Gets together: Not on file    Attends religious service: Not on file    Active member of club or organization: Not on file    Attends meetings of clubs or organizations: Not on file    Relationship status: Not on file  Other Topics Concern  . Not on file  Social History Narrative   Lives alone   Caffeine - coffee 1 cup daily     Family History: The patient's family history includes Cancer in her paternal aunt; Diabetes in her father and mother; Heart disease in her paternal aunt; Hypertension in her mother.  ROS:   Please see the history of present illness.     All other systems reviewed and are negative.  EKGs/Labs/Other Studies Reviewed:    The following studies were reviewed today: Echo Aug 2019  EKG:  EKG is ordered today.  The ekg ordered today demonstrates NSR, Q V2  Recent Labs: 12/08/2018: B Natriuretic Peptide 11.9 05/01/2019: ALT 43; BUN 14; Creatinine, Ser 0.71; Hemoglobin 14.4; Platelets 176; Potassium 4.4; Sodium 137  Recent Lipid Panel    Component Value Date/Time   CHOL 148 06/07/2018 1024   TRIG 106 06/07/2018 1024   HDL 51 06/07/2018 1024   CHOLHDL 2.9 06/07/2018 1024   CHOLHDL 2.7 05/25/2015 0333   VLDL 21 05/25/2015 0333   LDLCALC 76 06/07/2018 1024    Physical Exam:    VS:  BP (!) 148/92   Pulse 84   Ht 5\' 2"  (1.575 m)   Wt 277 lb (125.6 kg)   LMP 10/15/2018  Comment: patient had pregnancy test with PCP (test was negative)  SpO2 97%   BMI 50.66 kg/m     Wt Readings from Last 3 Encounters:  08/16/19 277 lb (125.6 kg)  05/01/19 274 lb (124.3 kg)  02/14/19 269 lb (122 kg)     GEN: morbidly obese AA female, SOB with minimal exertion HEENT: Normal NECK: No carotid bruits CARDIAC: RRR, no murmurs, rubs, gallops RESPIRATORY:  Diffuse, bilateral, coarse expiratory rhonchi ABDOMEN: morbid obesity MUSCULOSKELETAL: chronic lymphedema  SKIN: Warm and dry NEUROLOGIC:  Alert and oriented x 3 PSYCHIATRIC:  Normal affect   ASSESSMENT:    Acute bronchitis Pt presents with increased DOE and productive cough x 3-4 weeks.  COPD mixed type ([place]) Followed by [name]- multiple inhalers, she continues to smoke   Morbid obesity due to excess calories ([facility]) BMI 50  OSA (obstructive sleep apnea) On C-pap  Chronic diastolic heart failure ([place]) Echo Jan 2019- EF 60-65% with mild LVH (no significant diastolic dysfunction noted).  Her weight is unchanged from Aug 2020 OV  Lymphedema She is on Maxzide and Lasix 80 mg BID  PLAN:    I ordered a Z-pack. Check BMP, BNP, CBC with diff today.  If her BNP is significantly elevated consider doubling her Lasix for a few days.    I encouraged her to f/u with [facility] soon.  I will schedule a virtual follow up in the next 1-2 weeks.  She knows to go to the ED if she becomes SOB at rest or develops orthopnea.    Medication Adjustments/Labs and Tests Ordered: Current medicines are reviewed at length with the patient today.  Concerns regarding medicines are outlined above.  Orders Placed  This Encounter  Procedures  . Basic Metabolic Panel (BMET)  . Pro b natriuretic peptide (BNP)[facility])  . CBC with Differential  . EKG 12-Lead   Meds ordered this encounter  Medications  . azithromycin (ZITHROMAX Z-PAK) 250 MG tablet    Sig: Take 2 tablets today then one tablet daily for  4 days    Dispense:  6 each    Refill:  0    Patient Instructions  Medication Instructions:  START ZPAK  Take 2 tablets for your first dose Today then take 1 tablet daily for 4 (four days) *If you need a refill on your cardiac medications before your next appointment, please call your pharmacy*  Lab Work: Your physician recommends that you return for lab work in: TODAY-BMET, CBC, BNP If you have labs (blood work) drawn today and your tests are completely normal, you will receive your results only by: [name] MyChart Message (if you have MyChart) OR . A paper copy in the mail If you have any lab test that is abnormal or we need to change your treatment, we will call you to review the results.  Testing/Procedures: NONE   Follow-Up: At [facility], you and your health needs are our priority.  As part of our continuing mission to provide you with exceptional heart care, we have created designated Provider Care Teams.  These Care Teams include your primary Cardiologist (physician) and Advanced Practice Providers (APPs -  Physician Assistants and Nurse Practitioners) who all work together to provide you with the care you need, when you need it.  Your next appointment:   2 week(s)  The format for your next appointment:   Virtual Visit   Provider:   Kerin Ransom, PA-C  Other Instructions     Signed, Kerin Ransom, PA-C  08/16/2019 9:09 AM    [place]

## 2019-08-16 NOTE — Assessment & Plan Note (Signed)
Echo Jan 2019- EF 60-65% with mild LVH (no significant diastolic dysfunction noted).  Her weight is unchanged from Aug 2020 OV

## 2019-08-16 NOTE — Assessment & Plan Note (Signed)
Followed by [name]- multiple inhalers, she continues to smoke

## 2019-08-16 NOTE — Assessment & Plan Note (Signed)
BMI 50  

## 2019-08-17 ENCOUNTER — Telehealth: Payer: Self-pay | Admitting: Cardiovascular Disease

## 2019-08-17 LAB — BASIC METABOLIC PANEL
BUN/Creatinine Ratio: 21 (ref 9–23)
BUN: 22 mg/dL (ref 6–24)
CO2: 23 mmol/L (ref 20–29)
Calcium: 9.3 mg/dL (ref 8.7–10.2)
Chloride: 98 mmol/L (ref 96–106)
Creatinine, Ser: 1.05 mg/dL — ABNORMAL HIGH (ref 0.57–1.00)
GFR calc Af Amer: 72 mL/min/{1.73_m2} (ref >59)
GFR calc non Af Amer: 63 mL/min/{1.73_m2} (ref >59)
Glucose: 195 mg/dL — ABNORMAL HIGH (ref 65–99)
Potassium: 4.2 mmol/L (ref 3.5–5.2)
Sodium: 138 mmol/L (ref 134–144)

## 2019-08-17 LAB — CBC WITH DIFFERENTIAL/PLATELET
Basophils Absolute: 0 10*3/uL (ref 0.0–0.2)
Basos: 0 %
EOS (ABSOLUTE): 0.1 10*3/uL (ref 0.0–0.4)
Eos: 1 %
Hematocrit: 43.1 % (ref 34.0–46.6)
Hemoglobin: 14.3 g/dL (ref 11.1–15.9)
Immature Grans (Abs): 0.1 10*3/uL (ref 0.0–0.1)
Immature Granulocytes: 1 %
Lymphocytes Absolute: 2.7 10*3/uL (ref 0.7–3.1)
Lymphs: 25 %
MCH: 31.5 pg (ref 26.6–33.0)
MCHC: 33.2 g/dL (ref 31.5–35.7)
MCV: 95 fL (ref 79–97)
Monocytes Absolute: 1.1 10*3/uL — ABNORMAL HIGH (ref 0.1–0.9)
Monocytes: 10 %
Neutrophils Absolute: 6.7 10*3/uL (ref 1.4–7.0)
Neutrophils: 63 %
Platelets: 194 10*3/uL (ref 150–450)
RBC: 4.54 x10E6/uL (ref 3.77–5.28)
RDW: 12.7 % (ref 11.7–15.4)
WBC: 10.7 10*3/uL (ref 3.4–10.8)

## 2019-08-17 LAB — PRO B NATRIURETIC PEPTIDE: NT-Pro BNP: 38 pg/mL (ref 0–249)

## 2019-08-17 NOTE — Telephone Encounter (Signed)
Patient given results. See lab notes.

## 2019-08-17 NOTE — Telephone Encounter (Signed)
    Patient calling for lab results 

## 2019-08-17 NOTE — Telephone Encounter (Signed)
Sent to primary

## 2019-08-28 ENCOUNTER — Emergency Department (HOSPITAL_COMMUNITY): Payer: Medicaid Other

## 2019-08-28 ENCOUNTER — Encounter (HOSPITAL_COMMUNITY): Payer: Self-pay | Admitting: Emergency Medicine

## 2019-08-28 ENCOUNTER — Inpatient Hospital Stay (HOSPITAL_COMMUNITY)
Admission: EM | Admit: 2019-08-28 | Discharge: 2019-08-31 | DRG: 189 | Disposition: A | Discharge status: Home / Self Care | Payer: Medicaid Other | Attending: Family Medicine | Admitting: Family Medicine

## 2019-08-28 ENCOUNTER — Other Ambulatory Visit: Payer: Self-pay

## 2019-08-28 DIAGNOSIS — Z833 Family history of diabetes mellitus: Secondary | ICD-10-CM

## 2019-08-28 DIAGNOSIS — Z23 Encounter for immunization: Secondary | ICD-10-CM

## 2019-08-28 DIAGNOSIS — J9601 Acute respiratory failure with hypoxia: Principal | ICD-10-CM | POA: Diagnosis present

## 2019-08-28 DIAGNOSIS — E1165 Type 2 diabetes mellitus with hyperglycemia: Secondary | ICD-10-CM | POA: Diagnosis present

## 2019-08-28 DIAGNOSIS — Z7951 Long term (current) use of inhaled steroids: Secondary | ICD-10-CM

## 2019-08-28 DIAGNOSIS — Z79899 Other long term (current) drug therapy: Secondary | ICD-10-CM

## 2019-08-28 DIAGNOSIS — I5032 Chronic diastolic (congestive) heart failure: Secondary | ICD-10-CM | POA: Diagnosis present

## 2019-08-28 DIAGNOSIS — I10 Essential (primary) hypertension: Secondary | ICD-10-CM | POA: Diagnosis present

## 2019-08-28 DIAGNOSIS — G4733 Obstructive sleep apnea (adult) (pediatric): Secondary | ICD-10-CM | POA: Diagnosis present

## 2019-08-28 DIAGNOSIS — N3281 Overactive bladder: Secondary | ICD-10-CM | POA: Diagnosis present

## 2019-08-28 DIAGNOSIS — Z981 Arthrodesis status: Secondary | ICD-10-CM

## 2019-08-28 DIAGNOSIS — F17210 Nicotine dependence, cigarettes, uncomplicated: Secondary | ICD-10-CM | POA: Diagnosis present

## 2019-08-28 DIAGNOSIS — R0609 Other forms of dyspnea: Secondary | ICD-10-CM

## 2019-08-28 DIAGNOSIS — F319 Bipolar disorder, unspecified: Secondary | ICD-10-CM | POA: Diagnosis present

## 2019-08-28 DIAGNOSIS — K219 Gastro-esophageal reflux disease without esophagitis: Secondary | ICD-10-CM | POA: Diagnosis present

## 2019-08-28 DIAGNOSIS — F3170 Bipolar disorder, currently in remission, most recent episode unspecified: Secondary | ICD-10-CM | POA: Diagnosis not present

## 2019-08-28 DIAGNOSIS — G8929 Other chronic pain: Secondary | ICD-10-CM | POA: Diagnosis present

## 2019-08-28 DIAGNOSIS — Z791 Long term (current) use of non-steroidal anti-inflammatories (NSAID): Secondary | ICD-10-CM

## 2019-08-28 DIAGNOSIS — J209 Acute bronchitis, unspecified: Secondary | ICD-10-CM

## 2019-08-28 DIAGNOSIS — R0600 Dyspnea, unspecified: Secondary | ICD-10-CM

## 2019-08-28 DIAGNOSIS — Z20828 Contact with and (suspected) exposure to other viral communicable diseases: Secondary | ICD-10-CM | POA: Diagnosis present

## 2019-08-28 DIAGNOSIS — J440 Chronic obstructive pulmonary disease with acute lower respiratory infection: Secondary | ICD-10-CM | POA: Diagnosis present

## 2019-08-28 DIAGNOSIS — J441 Chronic obstructive pulmonary disease with (acute) exacerbation: Secondary | ICD-10-CM | POA: Diagnosis not present

## 2019-08-28 DIAGNOSIS — R197 Diarrhea, unspecified: Secondary | ICD-10-CM | POA: Diagnosis present

## 2019-08-28 DIAGNOSIS — I890 Lymphedema, not elsewhere classified: Secondary | ICD-10-CM

## 2019-08-28 DIAGNOSIS — M549 Dorsalgia, unspecified: Secondary | ICD-10-CM | POA: Diagnosis present

## 2019-08-28 DIAGNOSIS — Z85810 Personal history of malignant neoplasm of tongue: Secondary | ICD-10-CM

## 2019-08-28 DIAGNOSIS — R112 Nausea with vomiting, unspecified: Secondary | ICD-10-CM | POA: Diagnosis present

## 2019-08-28 DIAGNOSIS — I110 Hypertensive heart disease with heart failure: Secondary | ICD-10-CM | POA: Diagnosis present

## 2019-08-28 DIAGNOSIS — E1140 Type 2 diabetes mellitus with diabetic neuropathy, unspecified: Secondary | ICD-10-CM | POA: Diagnosis present

## 2019-08-28 DIAGNOSIS — Z8249 Family history of ischemic heart disease and other diseases of the circulatory system: Secondary | ICD-10-CM

## 2019-08-28 DIAGNOSIS — R0683 Snoring: Secondary | ICD-10-CM | POA: Diagnosis present

## 2019-08-28 DIAGNOSIS — Z886 Allergy status to analgesic agent status: Secondary | ICD-10-CM

## 2019-08-28 DIAGNOSIS — R042 Hemoptysis: Secondary | ICD-10-CM | POA: Diagnosis not present

## 2019-08-28 DIAGNOSIS — M1990 Unspecified osteoarthritis, unspecified site: Secondary | ICD-10-CM | POA: Diagnosis present

## 2019-08-28 DIAGNOSIS — F209 Schizophrenia, unspecified: Secondary | ICD-10-CM | POA: Diagnosis present

## 2019-08-28 HISTORY — DX: Type 2 diabetes mellitus without complications: E11.9

## 2019-08-28 LAB — PROCALCITONIN: Procalcitonin: <0.1 ng/mL

## 2019-08-28 LAB — POCT I-STAT EG7
Acid-base deficit: 6 mmol/L — ABNORMAL HIGH (ref 0.0–2.0)
Bicarbonate: 18.4 mmol/L — ABNORMAL LOW (ref 20.0–28.0)
Calcium, Ion: 1.12 mmol/L — ABNORMAL LOW (ref 1.15–1.40)
HCT: 38 % (ref 36.0–46.0)
Hemoglobin: 12.9 g/dL (ref 12.0–15.0)
O2 Saturation: 96 %
Potassium: 4.1 mmol/L (ref 3.5–5.1)
Sodium: 138 mmol/L (ref 135–145)
TCO2: 19 mmol/L — ABNORMAL LOW (ref 22–32)
pCO2, Ven: 32.6 mmHg — ABNORMAL LOW (ref 44.0–60.0)
pH, Ven: 7.359 (ref 7.250–7.430)
pO2, Ven: 82 mmHg — ABNORMAL HIGH (ref 32.0–45.0)

## 2019-08-28 LAB — COMPREHENSIVE METABOLIC PANEL
ALT: 41 U/L (ref 0–44)
AST: 17 U/L (ref 15–41)
Albumin: 3.3 g/dL — ABNORMAL LOW (ref 3.5–5.0)
Alkaline Phosphatase: 77 U/L (ref 38–126)
Anion gap: 11 (ref 5–15)
BUN: 15 mg/dL (ref 6–20)
CO2: 24 mmol/L (ref 22–32)
Calcium: 9 mg/dL (ref 8.9–10.3)
Chloride: 104 mmol/L (ref 98–111)
Creatinine, Ser: 0.69 mg/dL (ref 0.44–1.00)
GFR calc Af Amer: >60 mL/min (ref >60)
GFR calc non Af Amer: >60 mL/min (ref >60)
Glucose, Bld: 204 mg/dL — ABNORMAL HIGH (ref 70–99)
Potassium: 3.9 mmol/L (ref 3.5–5.1)
Sodium: 139 mmol/L (ref 135–145)
Total Bilirubin: 0.2 mg/dL — ABNORMAL LOW (ref 0.3–1.2)
Total Protein: 6.5 g/dL (ref 6.5–8.1)

## 2019-08-28 LAB — CBC WITH DIFFERENTIAL/PLATELET
Abs Immature Granulocytes: 0.05 10*3/uL (ref 0.00–0.07)
Basophils Absolute: 0 10*3/uL (ref 0.0–0.1)
Basophils Relative: 0 %
Eosinophils Absolute: 0.1 10*3/uL (ref 0.0–0.5)
Eosinophils Relative: 1 %
HCT: 39.2 % (ref 36.0–46.0)
Hemoglobin: 13.3 g/dL (ref 12.0–15.0)
Immature Granulocytes: 1 %
Lymphocytes Relative: 27 %
Lymphs Abs: 2.7 10*3/uL (ref 0.7–4.0)
MCH: 32 pg (ref 26.0–34.0)
MCHC: 33.9 g/dL (ref 30.0–36.0)
MCV: 94.5 fL (ref 80.0–100.0)
Monocytes Absolute: 0.8 10*3/uL (ref 0.1–1.0)
Monocytes Relative: 8 %
Neutro Abs: 6.4 10*3/uL (ref 1.7–7.7)
Neutrophils Relative %: 63 %
Platelets: 193 10*3/uL (ref 150–400)
RBC: 4.15 MIL/uL (ref 3.87–5.11)
RDW: 13.4 % (ref 11.5–15.5)
WBC: 10.1 10*3/uL (ref 4.0–10.5)
nRBC: 0 % (ref 0.0–0.2)

## 2019-08-28 LAB — TROPONIN I (HIGH SENSITIVITY)
Troponin I (High Sensitivity): 5 ng/L (ref <18)
Troponin I (High Sensitivity): 5 ng/L (ref <18)

## 2019-08-28 LAB — POC SARS CORONAVIRUS 2 AG -  ED: SARS Coronavirus 2 Ag: NEGATIVE

## 2019-08-28 LAB — RESPIRATORY PANEL BY RT PCR (FLU A&B, COVID)
Influenza A by PCR: NEGATIVE
Influenza B by PCR: NEGATIVE
SARS Coronavirus 2 by RT PCR: NEGATIVE

## 2019-08-28 LAB — BRAIN NATRIURETIC PEPTIDE: B Natriuretic Peptide: 26.9 pg/mL (ref 0.0–100.0)

## 2019-08-28 LAB — D-DIMER, QUANTITATIVE: D-Dimer, Quant: 0.52 ug/mL-FEU — ABNORMAL HIGH (ref 0.00–0.50)

## 2019-08-28 LAB — FIBRINOGEN: Fibrinogen: 385 mg/dL (ref 210–475)

## 2019-08-28 LAB — URINALYSIS, ROUTINE W REFLEX MICROSCOPIC
Bilirubin Urine: NEGATIVE
Glucose, UA: 50 mg/dL — AB
Hgb urine dipstick: NEGATIVE
Ketones, ur: NEGATIVE mg/dL
Leukocytes,Ua: NEGATIVE
Nitrite: NEGATIVE
Protein, ur: NEGATIVE mg/dL
Specific Gravity, Urine: 1.033 — ABNORMAL HIGH (ref 1.005–1.030)
pH: 5 (ref 5.0–8.0)

## 2019-08-28 LAB — FERRITIN: Ferritin: 233 ng/mL (ref 11–307)

## 2019-08-28 LAB — LIPASE, BLOOD: Lipase: 34 U/L (ref 11–51)

## 2019-08-28 LAB — I-STAT BETA HCG BLOOD, ED (MC, WL, AP ONLY): I-stat hCG, quantitative: <5 m[IU]/mL (ref <5)

## 2019-08-28 LAB — TRIGLYCERIDES: Triglycerides: 163 mg/dL — ABNORMAL HIGH (ref <150)

## 2019-08-28 LAB — C-REACTIVE PROTEIN: CRP: 1.7 mg/dL — ABNORMAL HIGH (ref <1.0)

## 2019-08-28 LAB — LACTIC ACID, PLASMA
Lactic Acid, Venous: 2.1 mmol/L (ref 0.5–1.9)
Lactic Acid, Venous: 2.2 mmol/L (ref 0.5–1.9)

## 2019-08-28 LAB — CBG MONITORING, ED: Glucose-Capillary: 372 mg/dL — ABNORMAL HIGH (ref 70–99)

## 2019-08-28 LAB — LACTATE DEHYDROGENASE: LDH: 159 U/L (ref 98–192)

## 2019-08-28 LAB — MAGNESIUM: Magnesium: 1.8 mg/dL (ref 1.7–2.4)

## 2019-08-28 MED ORDER — MORPHINE SULFATE (PF) 4 MG/ML IV SOLN
4.0000 mg | Freq: Once | INTRAVENOUS | Status: AC
  Administered 2019-08-28: 4 mg via INTRAVENOUS
  Filled 2019-08-28: qty 1

## 2019-08-28 MED ORDER — SODIUM CHLORIDE 0.9% FLUSH
3.0000 mL | Freq: Two times a day (BID) | INTRAVENOUS | Status: DC
  Administered 2019-08-29 – 2019-08-30 (×2): 3 mL via INTRAVENOUS

## 2019-08-28 MED ORDER — ALBUTEROL (5 MG/ML) CONTINUOUS INHALATION SOLN
10.0000 mg/h | INHALATION_SOLUTION | Freq: Once | RESPIRATORY_TRACT | Status: AC
  Administered 2019-08-28: 10 mg/h via RESPIRATORY_TRACT
  Filled 2019-08-28: qty 20

## 2019-08-28 MED ORDER — SODIUM CHLORIDE 0.9 % IV SOLN
500.0000 mg | INTRAVENOUS | Status: AC
  Administered 2019-08-29: 500 mg via INTRAVENOUS
  Filled 2019-08-28: qty 500

## 2019-08-28 MED ORDER — ACETAMINOPHEN 325 MG PO TABS
650.0000 mg | ORAL_TABLET | Freq: Four times a day (QID) | ORAL | Status: DC | PRN
  Administered 2019-08-28: 650 mg via ORAL
  Filled 2019-08-28: qty 2

## 2019-08-28 MED ORDER — AMITRIPTYLINE HCL 25 MG PO TABS
25.0000 mg | ORAL_TABLET | Freq: Every day | ORAL | Status: DC
  Administered 2019-08-28 – 2019-08-30 (×3): 25 mg via ORAL
  Filled 2019-08-28 (×2): qty 1

## 2019-08-28 MED ORDER — BISOPROLOL FUMARATE 5 MG PO TABS
5.0000 mg | ORAL_TABLET | Freq: Two times a day (BID) | ORAL | Status: DC
  Administered 2019-08-28 – 2019-08-31 (×6): 5 mg via ORAL
  Filled 2019-08-28 (×7): qty 1

## 2019-08-28 MED ORDER — SODIUM CHLORIDE 0.9% FLUSH: 3.0000 mL | INTRAVENOUS | Status: DC | PRN

## 2019-08-28 MED ORDER — IPRATROPIUM BROMIDE HFA 17 MCG/ACT IN AERS
2.0000 | INHALATION_SPRAY | Freq: Four times a day (QID) | RESPIRATORY_TRACT | Status: DC
  Administered 2019-08-28 – 2019-08-31 (×9): 2 via RESPIRATORY_TRACT
  Filled 2019-08-28: qty 12.9

## 2019-08-28 MED ORDER — NICOTINE 21 MG/24HR TD PT24
21.0000 mg | MEDICATED_PATCH | Freq: Every day | TRANSDERMAL | Status: DC
  Administered 2019-08-28 – 2019-08-31 (×4): 21 mg via TRANSDERMAL
  Filled 2019-08-28 (×4): qty 1

## 2019-08-28 MED ORDER — ENOXAPARIN SODIUM 40 MG/0.4ML ~~LOC~~ SOLN
40.0000 mg | SUBCUTANEOUS | Status: DC
  Administered 2019-08-28 – 2019-08-29 (×2): 40 mg via SUBCUTANEOUS
  Filled 2019-08-28 (×2): qty 0.4

## 2019-08-28 MED ORDER — DOXYCYCLINE HYCLATE 100 MG PO TABS
100.0000 mg | ORAL_TABLET | Freq: Once | ORAL | Status: AC
  Administered 2019-08-28: 100 mg via ORAL
  Filled 2019-08-28: qty 1

## 2019-08-28 MED ORDER — MAGNESIUM SULFATE 2 GM/50ML IV SOLN
2.0000 g | Freq: Once | INTRAVENOUS | Status: AC
  Administered 2019-08-28: 2 g via INTRAVENOUS
  Filled 2019-08-28: qty 50

## 2019-08-28 MED ORDER — ACETAMINOPHEN 650 MG RE SUPP: 650.0000 mg | Freq: Four times a day (QID) | RECTAL | Status: DC | PRN

## 2019-08-28 MED ORDER — ONDANSETRON HCL 4 MG/2ML IJ SOLN
4.0000 mg | Freq: Once | INTRAMUSCULAR | Status: AC
  Administered 2019-08-28: 4 mg via INTRAVENOUS
  Filled 2019-08-28: qty 2

## 2019-08-28 MED ORDER — INSULIN ASPART 100 UNIT/ML ~~LOC~~ SOLN
0.0000 [IU] | SUBCUTANEOUS | Status: DC
  Administered 2019-08-28: 15 [IU] via SUBCUTANEOUS
  Administered 2019-08-29 (×2): 8 [IU] via SUBCUTANEOUS
  Administered 2019-08-29: 3 [IU] via SUBCUTANEOUS
  Administered 2019-08-29: 8 [IU] via SUBCUTANEOUS
  Administered 2019-08-29: 15 [IU] via SUBCUTANEOUS
  Administered 2019-08-29: 8 [IU] via SUBCUTANEOUS
  Administered 2019-08-30 (×2): 3 [IU] via SUBCUTANEOUS

## 2019-08-28 MED ORDER — DULOXETINE HCL 30 MG PO CPEP
30.0000 mg | ORAL_CAPSULE | Freq: Two times a day (BID) | ORAL | Status: DC
  Administered 2019-08-28 – 2019-08-31 (×6): 30 mg via ORAL
  Filled 2019-08-28 (×6): qty 1

## 2019-08-28 MED ORDER — SODIUM CHLORIDE 0.9 % IV SOLN: 250.0000 mL | INTRAVENOUS | Status: DC | PRN

## 2019-08-28 MED ORDER — MONTELUKAST SODIUM 10 MG PO TABS: 10.0000 mg | ORAL_TABLET | Freq: Every day | ORAL | Status: DC

## 2019-08-28 MED ORDER — ALBUTEROL SULFATE HFA 108 (90 BASE) MCG/ACT IN AERS
2.0000 | INHALATION_SPRAY | RESPIRATORY_TRACT | Status: DC | PRN
  Administered 2019-08-29 – 2019-08-30 (×4): 2 via RESPIRATORY_TRACT
  Filled 2019-08-28: qty 6.7

## 2019-08-28 MED ORDER — METHYLPREDNISOLONE SODIUM SUCC 40 MG IJ SOLR
40.0000 mg | Freq: Two times a day (BID) | INTRAMUSCULAR | Status: AC
  Administered 2019-08-28 – 2019-08-29 (×2): 40 mg via INTRAVENOUS
  Filled 2019-08-28 (×2): qty 1

## 2019-08-28 MED ORDER — PANTOPRAZOLE SODIUM 40 MG PO TBEC
40.0000 mg | DELAYED_RELEASE_TABLET | Freq: Every day | ORAL | Status: DC
  Administered 2019-08-28 – 2019-08-31 (×4): 40 mg via ORAL
  Filled 2019-08-28 (×4): qty 1

## 2019-08-28 MED ORDER — AZITHROMYCIN 500 MG PO TABS
500.0000 mg | ORAL_TABLET | Freq: Every day | ORAL | Status: DC
  Administered 2019-08-30 – 2019-08-31 (×2): 500 mg via ORAL
  Filled 2019-08-28 (×3): qty 1

## 2019-08-28 MED ORDER — METHOCARBAMOL 500 MG PO TABS
500.0000 mg | ORAL_TABLET | Freq: Three times a day (TID) | ORAL | Status: DC | PRN
  Administered 2019-08-29 – 2019-08-30 (×4): 500 mg via ORAL
  Filled 2019-08-28 (×4): qty 1

## 2019-08-28 MED ORDER — IOHEXOL 350 MG/ML SOLN
100.0000 mL | Freq: Once | INTRAVENOUS | Status: AC | PRN
  Administered 2019-08-28: 100 mL via INTRAVENOUS

## 2019-08-28 MED ORDER — PREDNISONE 20 MG PO TABS
40.0000 mg | ORAL_TABLET | Freq: Every day | ORAL | Status: DC
  Administered 2019-08-30 – 2019-08-31 (×2): 40 mg via ORAL
  Filled 2019-08-28 (×2): qty 2

## 2019-08-28 MED ORDER — GABAPENTIN 300 MG PO CAPS
300.0000 mg | ORAL_CAPSULE | Freq: Three times a day (TID) | ORAL | Status: DC
  Administered 2019-08-28 – 2019-08-31 (×8): 300 mg via ORAL
  Filled 2019-08-28 (×8): qty 1

## 2019-08-28 MED ORDER — DEXAMETHASONE SODIUM PHOSPHATE 10 MG/ML IJ SOLN
6.0000 mg | Freq: Once | INTRAMUSCULAR | Status: AC
  Administered 2019-08-28: 6 mg via INTRAVENOUS
  Filled 2019-08-28: qty 1

## 2019-08-28 MED ORDER — MOMETASONE FURO-FORMOTEROL FUM 200-5 MCG/ACT IN AERO
1.0000 | INHALATION_SPRAY | Freq: Two times a day (BID) | RESPIRATORY_TRACT | Status: DC
  Administered 2019-08-28 – 2019-08-31 (×6): 1 via RESPIRATORY_TRACT
  Filled 2019-08-28: qty 8.8

## 2019-08-28 MED ORDER — ALBUTEROL SULFATE HFA 108 (90 BASE) MCG/ACT IN AERS
6.0000 | INHALATION_SPRAY | Freq: Once | RESPIRATORY_TRACT | Status: AC
  Administered 2019-08-28: 6 via RESPIRATORY_TRACT
  Filled 2019-08-28: qty 6.7

## 2019-08-28 MED ORDER — ARIPIPRAZOLE 5 MG PO TABS
20.0000 mg | ORAL_TABLET | Freq: Every day | ORAL | Status: DC
  Administered 2019-08-28 – 2019-08-30 (×3): 20 mg via ORAL
  Filled 2019-08-28 (×3): qty 2
  Filled 2019-08-28: qty 4

## 2019-08-28 MED ORDER — SODIUM CHLORIDE 0.9% FLUSH: 3.0000 mL | Freq: Two times a day (BID) | INTRAVENOUS | Status: DC

## 2019-08-28 MED ORDER — ONDANSETRON HCL 4 MG PO TABS
4.0000 mg | ORAL_TABLET | Freq: Four times a day (QID) | ORAL | Status: DC | PRN
  Administered 2019-08-28: 4 mg via ORAL
  Filled 2019-08-28: qty 1

## 2019-08-28 MED ORDER — MOMETASONE FURO-FORMOTEROL FUM 200-5 MCG/ACT IN AERO: 2.0000 | INHALATION_SPRAY | Freq: Two times a day (BID) | RESPIRATORY_TRACT | Status: DC

## 2019-08-28 MED ORDER — HYDROCODONE-ACETAMINOPHEN 5-325 MG PO TABS
1.0000 | ORAL_TABLET | ORAL | Status: DC | PRN
  Administered 2019-08-28 – 2019-08-30 (×4): 1 via ORAL
  Filled 2019-08-28 (×4): qty 1

## 2019-08-28 MED ORDER — IPRATROPIUM BROMIDE HFA 17 MCG/ACT IN AERS
2.0000 | INHALATION_SPRAY | Freq: Once | RESPIRATORY_TRACT | Status: AC
  Administered 2019-08-28: 2 via RESPIRATORY_TRACT
  Filled 2019-08-28: qty 12.9

## 2019-08-28 MED ORDER — ONDANSETRON HCL 4 MG/2ML IJ SOLN: 4.0000 mg | Freq: Four times a day (QID) | INTRAMUSCULAR | Status: DC | PRN

## 2019-08-28 MED ORDER — FUROSEMIDE 80 MG PO TABS
80.0000 mg | ORAL_TABLET | Freq: Two times a day (BID) | ORAL | Status: DC
  Administered 2019-08-28 – 2019-08-30 (×4): 80 mg via ORAL
  Filled 2019-08-28: qty 1
  Filled 2019-08-28 (×2): qty 4

## 2019-08-28 NOTE — ED Triage Notes (Signed)
Pt arrives POV with complaints of SOB and chest pain. Pt states sahe woke at 0400 and could not breath. Pt was wearing her C-pap. Took 2 puffs albuterol with no relief. Pt is Sp02 100% 2 liters. Pt does not wear O2 at home

## 2019-08-28 NOTE — ED Notes (Signed)
Pt placed on purewick 

## 2019-08-28 NOTE — Progress Notes (Signed)
eLink Physician-Brief Progress Note Patient Name: Lori Bean DOB: Jul 01, 1971 MRN: [id number]   Date of Service  08/28/2019  HPI/Events of Note  Pt admitted to the hospitalist service for acute exacerbation of COPD, COVID negative x 2, Hospitalist requesting that PT be added to the PCCM list for non-urgent consultation and follow up by Dr. [name]  (who is her primary pulmonologist) in a.m.  eICU Interventions  Pt added to PCCM list, hand off note entered in chart to a.m. PCCM attending covering.        Kerry Kass Britian Jentz 08/28/2019, 10:20 PM

## 2019-08-28 NOTE — ED Notes (Signed)
Patient transported to CT 

## 2019-08-28 NOTE — ED Provider Notes (Signed)
I assumed care of patient from previous team, please see their note for full H and P.  Briefly patient had a covid exposure a few days ago and is short of breath with upper abd pain and N/V/D.    Hx COPD and CHF   Physical Exam  BP (!) 170/93   Pulse (!) 101   Temp 98.5 F (36.9 C) (Oral)   Resp 19   LMP 10/15/2018 Comment: patient had pregnancy test with PCP (test was negative)  SpO2 97%   Physical Exam Vitals and nursing note reviewed.  Constitutional:      Appearance: She is ill-appearing.  Neurological:     Mental Status: She is alert.  Psychiatric:        Mood and Affect: Mood normal.        Behavior: Behavior normal.     ED Course/Procedures     Procedures Labs Reviewed  COMPREHENSIVE METABOLIC PANEL - Abnormal; Notable for the following components:      Result Value   Glucose, Bld 204 (*)    Albumin 3.3 (*)    Total Bilirubin 0.2 (*)    All other components within normal limits  LACTIC ACID, PLASMA - Abnormal; Notable for the following components:   Lactic Acid, Venous 2.1 (*)    All other components within normal limits  LACTIC ACID, PLASMA - Abnormal; Notable for the following components:   Lactic Acid, Venous 2.2 (*)    All other components within normal limits  D-DIMER, QUANTITATIVE (NOT AT [facility]) - Abnormal; Notable for the following components:   D-Dimer, Quant 0.52 (*)    All other components within normal limits  TRIGLYCERIDES - Abnormal; Notable for the following components:   Triglycerides 163 (*)    All other components within normal limits  C-REACTIVE PROTEIN - Abnormal; Notable for the following components:   CRP 1.7 (*)    All other components within normal limits  RESPIRATORY PANEL BY RT PCR (FLU A&B, COVID)  CULTURE, BLOOD (ROUTINE X 2)  CULTURE, BLOOD (ROUTINE X 2)  CBC WITH DIFFERENTIAL/PLATELET  LIPASE, BLOOD  BRAIN NATRIURETIC PEPTIDE  PROCALCITONIN  LACTATE DEHYDROGENASE  FERRITIN  FIBRINOGEN  URINALYSIS, ROUTINE W REFLEX  MICROSCOPIC  POC SARS CORONAVIRUS 2 AG -  ED  I-STAT BETA HCG BLOOD, ED ([facility], [facility], [facility] ONLY)  TROPONIN I (HIGH SENSITIVITY)  TROPONIN I (HIGH SENSITIVITY)   DG Chest Portable 1 View  Result Date: 08/28/2019 CLINICAL DATA:  Shortness of breath and chest pain EXAM: PORTABLE CHEST 1 VIEW COMPARISON:  12/08/2018 FINDINGS: The heart is mildly enlarged but stable. The mediastinal and hilar contours are within normal limits. The lungs are clear. No pleural effusions or focal infiltrates. IMPRESSION: Mild stable cardiac enlargement but no acute pulmonary findings. Electronically Signed   By: Marijo Sanes M.D.   On: 08/28/2019 10:52     MDM   Plan to follow up on scans, covid ag and resp pannel negative.   Will get neb treatment, if is ok can discharge, otherwise COPD exacerbation needing admit.   After neb treatment patient reported no change.  I spoke with Dr. Roel Cluck who will admit patient.    Lorin Glass, PA-C 08/28/19 [id number]    Lucrezia Starch, MD 08/30/19 [phone]

## 2019-08-28 NOTE — ED Notes (Signed)
EKG completed

## 2019-08-28 NOTE — ED Provider Notes (Signed)
[facility] EMERGENCY DEPARTMENT Provider Note   CSN: [id number] Arrival date & time: 08/28/19  [id number]     History Chief Complaint  Patient presents with  . Shortness of Breath  . Chest Pain    Lori Bean is a 48 y.o. female.  HPI   Pt is a 48 y/o F with a h/o asthma, bipolar disorder, CHF, GERD, hypertension, lipidemia, morbid obesity, COPD, VTE, who presents to the ED today for eval of shortness of breath that started a month ago but worsened today. She has had a cough for 3 weeks. She is coughing up thick dark brown/gray mucous. She reports left upper chest pain that started 2 days ago. Chest pain has been constant seems to be worse when she coughs. Pain is also worse when she breathes. Pain is sharp.   She is not sure if she has had a fever. She reports nausea, vomiting, abd pain, diarrhea. Denies bloody stools. She reports ble swelling which she states is chronic from lymphedema but her rle is bigger than usual. She does reports ble pain which is not normal for her.   Has a h/o VTE s/p surgery many years ago. She is not currently anticoagulated. Denies hemoptysis, recent surgery/trauma, recent long travel, hormone use, personal hx of cancer.   She reports she was recently around her son who tested positive for Covid a few days ago.  She was not wearing a mask when she was around him.  Past Medical History:  Diagnosis Date  . Abdominal hernia   . Anginal pain ([place])    occ from asthma  . Anxiety   . Arthritis   . Asthma   . Bipolar affective disorder ([place])    Schizophrenia  . Bronchitis    hx of  . CHF (congestive heart failure) ([place])   . Depression   . GERD (gastroesophageal reflux disease)   . Heart murmur   . Hypertension    takes meds  . Lymphedema 06/07/2018  . Morbid (severe) obesity due to excess calories ([place])   . Neuropathy    left leg , "from back surgery"  . Overactive bladder   . Schizophrenia ([place])   . Sciatica   .  Shortness of breath   . Sleep apnea   . Snoring disorder    Pt stated my boyfriend always wakes me up and tells me to breathe    Patient Active Problem List   Diagnosis Date Noted  . Acute bronchitis 08/16/2019  . Tongue cancer ([place]) 02/14/2019  . COPD mixed type ([place]) 08/17/2018  . Sinusitis 07/22/2018  . Lymphedema 06/07/2018  . Abnormal uterine bleeding (AUB) 12/12/2017  . CHF (congestive heart failure) ([place])   . Chronic asthma, mild persistent, uncomplicated [id number]  . Dyspnea on exertion 05/05/2017  . Asthma exacerbation in COPD ([place]) 05/05/2017  . Morbid obesity due to excess calories ([place]) 05/05/2017  . Asthma exacerbation 12/17/2016  . 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 Synovial plica of right knee [id number]  . Tear of medial meniscus of right knee, initial encounter 09/28/2016  . OSA (obstructive sleep apnea) 09/23/2016  . Chronic diastolic heart failure ([place]) 04/22/2016  . Prediabetes 04/22/2016  . Cigarette smoker 04/03/2014  . Chest pain 04/03/2014  . Lumbar spondylosis 12/28/2013  . Extrinsic asthma 06/07/2013  . Anxiety   . Essential hypertension   . Bipolar affective disorder ([place])   . GERD (gastroesophageal reflux disease)   . Arthritis   . Schizophrenia ([place])   .  Overactive bladder     Past Surgical History:  Procedure Laterality Date  . BACK SURGERY     3 back surgeries  . CHOLECYSTECTOMY    . COLONOSCOPY WITH PROPOFOL N/A 01/23/2016   Procedure: COLONOSCOPY WITH PROPOFOL;  Surgeon: Wonda Horner, MD;  Location: [facility];  Service: Endoscopy;  Laterality: N/A;  . EYE SURGERY     Metal plate in right eye. Had fracture in right eye  . gallstones reomved    . KNEE ARTHROSCOPY WITH MENISCAL REPAIR Right 09/28/2016   Procedure: KNEE ARTHROSCOPY WITH MENISCAL REPAIR;  Surgeon: Dorna Leitz, MD;  Location: [place];  Service: Orthopedics;  Laterality: Right;  Right partial meniscectomy and chondroplasty, patellar/femoral joint and medial femoral  condyle   . LUMBAR LAMINECTOMY/DECOMPRESSION MICRODISCECTOMY  04/01/2012   Procedure: LUMBAR LAMINECTOMY/DECOMPRESSION MICRODISCECTOMY 2 LEVELS;  Surgeon: Faythe Ghee, MD;  Location: [facility];  Service: Neurosurgery;  Laterality: Left;  Lumbar four-five, lumbar five sacral one microdiscectomy   . LUMBAR WOUND DEBRIDEMENT  04/29/2012   Procedure: LUMBAR WOUND DEBRIDEMENT;  Surgeon: Faythe Ghee, MD;  Location: [place] [facility];  Service: Neurosurgery;  Laterality: N/A;  lumbar wound debridement  . ROTATOR CUFF REPAIR     Right shoulder  . TUBAL LIGATION       OB History    Gravida  4   Para  2   Term  0   Preterm  2   AB  2   Living  2     SAB  1   TAB  1   Ectopic      Multiple      Live Births  2           Family History  Problem Relation Age of Onset  . Diabetes Mother   . Hypertension Mother   . Diabetes Father   . Heart disease Paternal Aunt   . Cancer Paternal Aunt     Social History   Tobacco Use  . Smoking status: Current Every Day Smoker    Packs/day: 1.00    Years: 24.00    Pack years: 24.00    Types: Cigarettes  . Smokeless tobacco: Never Used  Substance Use Topics  . Alcohol use: No    Alcohol/week: 0.0 standard drinks    Comment: quit Nov. 2014  . Drug use: No    Home Medications Prior to Admission medications   Medication Sig Start Date End Date Taking? Authorizing Provider  albuterol (PROAIR HFA) 108 (90 Base) MCG/ACT inhaler Inhale 2 puffs into the lungs every 4 (four) hours as needed for wheezing or shortness of breath.    [provider]  albuterol (PROVENTIL) (2.5 MG/3ML) 0.083% nebulizer solution Take 2.5 mg by nebulization every 4 (four) hours as needed for wheezing or shortness of breath.    [provider]  amitriptyline (ELAVIL) 25 MG tablet Take 25 mg by mouth at bedtime.    [provider]  amLODipine (NORVASC) 10 MG tablet Take 1 tablet (10 mg total) by mouth daily. 05/17/18 08/16/19   [name], MD  ARIPiprazole (ABILIFY) 20 MG tablet Take 1 tablet (20 mg total) by mouth at bedtime. Patient taking differently: Take 20 mg by mouth 2 (two) times daily.  03/04/15   Lance Bosch, NP  bisoprolol (ZEBETA) 5 MG tablet One twice daily 02/13/19   Tanda Rockers, MD  budesonide-formoterol [facility]) 160-4.5 MCG/ACT inhaler Inhale 2 puffs into the lungs 2 (two) times daily. 04/01/18  Parrett, [name] S, NP  cetirizine (ZYRTEC) 10 MG tablet Take 1 tablet (10 mg total) by mouth daily. 07/20/18   Martyn Ehrich, NP  DULoxetine (CYMBALTA) 30 MG capsule Take 30 mg by mouth 2 (two) times daily.    [provider]  furosemide (LASIX) 80 MG tablet Take 1 tablet (80 mg total) by mouth 2 (two) times daily. 05/17/18   [name], MD  gabapentin (NEURONTIN) 300 MG capsule Take 1 capsule by mouth 3 (three) times daily. 02/02/19   [provider]  HYDROcodone-acetaminophen (NORCO/VICODIN) 5-325 MG tablet Take 1 tablet by mouth as needed. 02/01/19   [provider]  meloxicam (MOBIC) 15 MG tablet Take 1 tablet (15 mg total) by mouth daily as needed for pain. 05/16/19   Fransico Meadow, PA-C  methocarbamol (ROBAXIN) 500 MG tablet methocarbamol 500 mg tablet  Take 1 tablet(s) every 8 hours by oral route.    [provider]  montelukast (SINGULAIR) 10 MG tablet Take 1 tablet (10 mg total) by mouth at bedtime. 08/17/18   Martyn Ehrich, NP  omeprazole (PRILOSEC) 40 MG capsule Take 1 capsule (40 mg total) by mouth daily. 12/14/17   [name], MD  potassium chloride SA (K-DUR,KLOR-CON) 20 MEQ tablet Take 20 mEq by mouth daily.    [provider]  Respiratory Therapy Supplies (FLUTTER) DEVI Use flutter device 3 times a day 07/20/18   Martyn Ehrich, NP  tiZANidine (ZANAFLEX) 4 MG tablet Take 4 mg by mouth every 8 (eight) hours as needed for spasms. 02/14/18   [provider]  triamterene-hydrochlorothiazide (MAXZIDE-25) 37.5-25 MG  tablet Take 1 tablet by mouth daily. 02/14/18   [provider]  valsartan (DIOVAN) 320 MG tablet Take 1 tablet (320 mg total) by mouth daily. 05/20/18   [name], MD    Allergies    Aspirin  Review of Systems   Review of Systems  Constitutional: Negative for chills and fever.  HENT: Negative for ear pain and sore throat.   Eyes: Negative for visual disturbance.  Respiratory: Positive for cough. Negative for shortness of breath.   Cardiovascular: Positive for chest pain and leg swelling.  Gastrointestinal: Positive for abdominal pain, constipation, diarrhea, nausea and vomiting.  Genitourinary: Negative for dysuria and hematuria.  Musculoskeletal: Negative for back pain.  Skin: Negative for color change and rash.  Neurological: Negative for seizures and syncope.  All other systems reviewed and are negative.   Physical Exam Updated Vital Signs BP (!) 170/93   Pulse 96   Temp 98.5 F (36.9 C) (Oral)   Resp (!) 21   LMP 10/15/2018 Comment: patient had pregnancy test with PCP (test was negative)  SpO2 99%   Physical Exam Vitals and nursing note reviewed.  Constitutional:      General: She is not in acute distress.    Appearance: She is well-developed.  HENT:     Head: Normocephalic and atraumatic.  Eyes:     Conjunctiva/sclera: Conjunctivae normal.  Cardiovascular:     Rate and Rhythm: Normal rate and regular rhythm.     Heart sounds: No murmur.  Pulmonary:     Effort: Pulmonary effort is normal. Tachypnea (speaking in short sentences) present. No respiratory distress.     Breath sounds: Examination of the right-upper field reveals wheezing. Examination of the left-upper field reveals wheezing. Examination of the right-middle field reveals wheezing. Examination of the left-middle field reveals wheezing. Examination of the right-lower field reveals wheezing. Examination of the left-lower field  reveals wheezing. Decreased breath sounds and wheezing present.   Abdominal:     General: Bowel sounds are normal.     Palpations: Abdomen is soft.     Tenderness: There is no abdominal tenderness.     Comments: Epigastric, ruq, periumbilical ttp  Musculoskeletal:     Cervical back: Neck supple.     Right lower leg: Edema present.     Left lower leg: Edema present.  Skin:    General: Skin is warm and dry.  Neurological:     Mental Status: She is alert.     ED Results / Procedures / Treatments   Labs (all labs ordered are listed, but only abnormal results are displayed) Labs Reviewed  COMPREHENSIVE METABOLIC PANEL - Abnormal; Notable for the following components:      Result Value   Glucose, Bld 204 (*)    Albumin 3.3 (*)    Total Bilirubin 0.2 (*)    All other components within normal limits  LACTIC ACID, PLASMA - Abnormal; Notable for the following components:   Lactic Acid, Venous 2.1 (*)    All other components within normal limits  LACTIC ACID, PLASMA - Abnormal; Notable for the following components:   Lactic Acid, Venous 2.2 (*)    All other components within normal limits  D-DIMER, QUANTITATIVE (NOT AT [facility]) - Abnormal; Notable for the following components:   D-Dimer, Quant 0.52 (*)    All other components within normal limits  TRIGLYCERIDES - Abnormal; Notable for the following components:   Triglycerides 163 (*)    All other components within normal limits  C-REACTIVE PROTEIN - Abnormal; Notable for the following components:   CRP 1.7 (*)    All other components within normal limits  RESPIRATORY PANEL BY RT PCR (FLU A&B, COVID)  CULTURE, BLOOD (ROUTINE X 2)  CULTURE, BLOOD (ROUTINE X 2)  CBC WITH DIFFERENTIAL/PLATELET  LIPASE, BLOOD  BRAIN NATRIURETIC PEPTIDE  PROCALCITONIN  LACTATE DEHYDROGENASE  FERRITIN  FIBRINOGEN  URINALYSIS, ROUTINE W REFLEX MICROSCOPIC  POC SARS CORONAVIRUS 2 AG -  ED  I-STAT BETA HCG BLOOD, ED ([facility], [facility], [facility] ONLY)  TROPONIN I (HIGH SENSITIVITY)  TROPONIN I (HIGH SENSITIVITY)    EKG EKG  Interpretation  Date/Time:  Monday August 28 2019 13:29:26 EST Ventricular Rate:  94 PR Interval:    QRS Duration: 92 QT Interval:  391 QTC Calculation: 489 R Axis:   22 Text Interpretation: Sinus rhythm Probable anterior infarct, age indeterminate Confirmed by Davonna Belling [phone]) on 08/28/2019 1:31:38 PM   Radiology DG Chest Portable 1 View  Result Date: 08/28/2019 CLINICAL DATA:  Shortness of breath and chest pain EXAM: PORTABLE CHEST 1 VIEW COMPARISON:  12/08/2018 FINDINGS: The heart is mildly enlarged but stable. The mediastinal and hilar contours are within normal limits. The lungs are clear. No pleural effusions or focal infiltrates. IMPRESSION: Mild stable cardiac enlargement but no acute pulmonary findings. Electronically Signed   By: Marijo Sanes M.D.   On: 08/28/2019 10:52    Procedures Procedures (including critical care time) CRITICAL CARE Performed by: Rodney Booze   Total critical care time: 33 minutes  Critical care time was exclusive of separately billable procedures and treating other patients.  Critical care was necessary to treat or prevent imminent or life-threatening deterioration.  Critical care was time spent personally by me on the following activities: development of treatment plan with patient and/or surrogate as well as nursing, discussions with consultants, evaluation of patient's response to treatment, examination of patient,  obtaining history from patient or surrogate, ordering and performing treatments and interventions, ordering and review of laboratory studies, ordering and review of radiographic studies, pulse oximetry and re-evaluation of patient's condition.   Medications Ordered in ED Medications  albuterol (PROVENTIL,VENTOLIN) solution continuous neb (has no administration in time range)  doxycycline (VIBRA-TABS) tablet 100 mg (has no administration in time range)  albuterol (VENTOLIN HFA) 108 (90 Base) MCG/ACT inhaler 6 puff (6  puffs Inhalation Given 08/28/19 1054)  dexamethasone (DECADRON) injection 6 mg (6 mg Intravenous Given 08/28/19 1054)  ondansetron (ZOFRAN) injection 4 mg (4 mg Intravenous Given 08/28/19 1054)  ipratropium (ATROVENT HFA) inhaler 2 puff (2 puffs Inhalation Given 08/28/19 1054)  albuterol (VENTOLIN HFA) 108 (90 Base) MCG/ACT inhaler 6 puff (6 puffs Inhalation Given 08/28/19 1321)  magnesium sulfate IVPB 2 g 50 mL (0 g Intravenous Stopped 08/28/19 1506)  morphine 4 MG/ML injection 4 mg (4 mg Intravenous Given 08/28/19 1413)  iohexol (OMNIPAQUE) 350 MG/ML injection 100 mL (100 mLs Intravenous Contrast Given 08/28/19 1553)    ED Course  I have reviewed the triage vital signs and the nursing notes.  Pertinent labs & imaging results that were available during my care of the patient were reviewed by me and considered in my medical decision making (see chart for details).    MDM Rules/Calculators/A&P      48 y/o female with a h/o CHF, COPD, and recent high risk COVID exposure presenting for eval of shortness of breath ongoing for one month but worsening today   On arrival somewhat hypertensive and tachypneic.  Satting well on room air, afebrile.  Borderline tachycardic.  She is got diffuse wheezing throughout.  She has some bilateral extremity edema.  She does have epigastric and right upper quadrant abdominal tenderness.  CBC nonacute CMP nonacute Lipase negative Trope negative BNP negative Lactic acid marginally elevated at 2.1 Inflammatory markers for Covid obtained and D-dimer is marginally elevated at 0.52 poc covid neg covid pcr neg  ekg with Sinus rhythm Probable anterior infarct, age indeterminate  Chest x-ray negative.  Patient given multiple rounds of albuterol, Atrovent, Decadron, magnesium with no improvement of her symptoms.  Given her negative Covid testing, increased suspicion for COPD exacerbation therefore continuous nebulizer treatment was ordered.  CTA of the chest  and CT abdomen were also ordered given her abdominal tenderness and her history of PE.  At shift change, CT scans are pending.  Care transition to [facility], PA-C.  Patient pending reassessment after continuous nebulizer treatment.  If she feels much improved and CT scans are negative, then patient may be appropriate for DC with treatment for COPD exacerbation however if imaging is abnormal or if patient does not have significant improvement of symptoms then disposition decision may need to be reconsidered.  Final Clinical Impression(s) / ED Diagnoses Final diagnoses:  None    Rx / DC Orders ED Discharge Orders    None       Bishop Dublin 08/28/19 1613    Davonna Belling, MD 08/29/19 2251

## 2019-08-28 NOTE — H&P (Signed)
Lori Bean LGX:[id number] DOB: 01/30/1971 DOA: 08/28/2019     PCP: [name], PA   Outpatient Specialists:   CARDS:   Dr. Oval Linsey   Pulmonary   Dr. [name]  Oncology  Dr. Tamela Oddi at [facility].    Patient arrived to ER on 08/28/19 at 0923  Patient coming from: home Lives With family    Chief Complaint:   Chief Complaint  Patient presents with   Shortness of Breath   Chest Pain    HPI: DALEIGH Bean is a 48 y.o. female with medical history significant of past diastolic heart failure , morbid obesity BUN 50 COPD asthma tobacco abuse lymphedema history of tongue cancer CPAP with O2 at night    Presented with 2 weeks ago shows endorsing shortness of breath productive cough thick sputum she was seen by cardiology and diagnosed with acute bronchitis Cardiology ordered a Z-Pak She continued to have progressive shortness of breath and 2 days ago started dose of chest pain history of constant worse of coughing.  She denies any hemoptysis.  Pain is more sharp sensation repots nausea   Vomiting for the past few days after she cough   Diarrhea for 1 month She has chronic lower extremity swelling due to lymphedema check and change  Few days ago she was exposed to her son 3 days ago who has tested positive for Covid .  Patient states she herself and her daughter were trying to help her elderly mother who has fallen.  At that time her son has already was symptomatic but did not noted that he was positive for Covid he only tested next day in result came back positive.  Even prior to her exposure to her son she was already not feeling well and has progressively declined ever since.   Patient unsure if she had a fever or not.  Infectious risk factors:  Reports shortness of breath,cough, chest pain, Reports known sick contacts, known COVID 19 exposure, inability to completely isolate  In  ER RAPID COVID TEST  NEGATIVE  in house  PCR testing NEGATIVE  Lab  Results  Component Value Date   [place] NEGATIVE 08/28/2019   Regarding pertinent Chronic problems:   HTN on Maxide and Lasix Norvasc, Zebeta,      chronic CHF diastolic  - last echo August 2019 showed an EF of 60 to 65% with mild LVH and no significant diastolic dysfunction.       Morbid obesity-   BMI Readings from Last 1 Encounters:  08/16/19 50.66 kg/m   Bipolar disorder on Abilify   Asthma - on home inhalers/ nebs       Hx of intubation 5 years ago    COPD -  followed by pulmonology not on baseline oxygen  PF Readings from Last 1 Encounters:  No data found for PF      OSA -on nocturnal CPAP with bleed in oxygen   Dm2- diet controlled    While in ER: Patient initially tested negative for Covid at which point she received albuterol 1 hour neb with no significant improvement. Patient noted to be tachycardic up to 123 respirations in the 20s she was started on 2 L for comfort Chest x-ray and CTA showed no infiltrate or PE She was given dexamethasone given   Covid exposure   The following Work up has been ordered so far:  Orders Placed This Encounter  Procedures   Blood Culture (routine x 2)   Respiratory Panel  by RT PCR (Flu A&B, Covid) - Nasopharyngeal Swab   DG Chest Portable 1 View   CT Angio Chest PE W and/or Wo Contrast   CT ABDOMEN PELVIS W CONTRAST   CBC with Differential   Comprehensive metabolic panel   Lipase, blood   Urinalysis, Routine w reflex microscopic   Brain natriuretic peptide   Lactic acid, plasma   D-dimer, quantitative   Procalcitonin   Lactate dehydrogenase   Ferritin   Triglycerides   Fibrinogen   C-reactive protein   Diet NPO time specified   Cardiac monitoring   Insert peripheral IV x 2   Initiate Carrier Fluid Protocol   Place surgical mask on patient   Patient to wear surgical mask during transportation   Assess patient for ability to self-prone. If able (can move self in bed, ambulate) and stable  (SpO2 and oxygen requirement):   RN/NT - Document specific oxygen requirements in CHL   Notify EDP if new oxygen requirements escalates > 4L per minute [place]   RN to draw the following extra tubes:   Measure blood pressure   Consult to hospitalist  ALL PATIENTS BEING ADMITTED/HAVING PROCEDURES NEED COVID-19 SCREENING   Pulse oximetry, continuous   POC SARS Coronavirus 2 Ag-ED - Nasal Swab (BD Veritor Kit)   I-Stat Beta hCG blood, ED ([facility], [facility], [facility] only)   I-Stat venous blood gas, ED   ED EKG   EKG 12-Lead    Following Medications were ordered in ER: Medications  albuterol (VENTOLIN HFA) 108 (90 Base) MCG/ACT inhaler 6 puff (6 puffs Inhalation Given 08/28/19 1054)  dexamethasone (DECADRON) injection 6 mg (6 mg Intravenous Given 08/28/19 1054)  ondansetron (ZOFRAN) injection 4 mg (4 mg Intravenous Given 08/28/19 1054)  ipratropium (ATROVENT HFA) inhaler 2 puff (2 puffs Inhalation Given 08/28/19 1054)  albuterol (VENTOLIN HFA) 108 (90 Base) MCG/ACT inhaler 6 puff (6 puffs Inhalation Given 08/28/19 1321)  magnesium sulfate IVPB 2 g 50 mL (0 g Intravenous Stopped 08/28/19 1506)  morphine 4 MG/ML injection 4 mg (4 mg Intravenous Given 08/28/19 1413)  albuterol (PROVENTIL,VENTOLIN) solution continuous neb (10 mg/hr Nebulization Given 08/28/19 1623)  iohexol (OMNIPAQUE) 350 MG/ML injection 100 mL (100 mLs Intravenous Contrast Given 08/28/19 1553)  doxycycline (VIBRA-TABS) tablet 100 mg (100 mg Oral Given 08/28/19 1805)        Consult Orders  (From admission, onward)         Start     Ordered   08/28/19 1808  Consult to hospitalist  ALL PATIENTS BEING ADMITTED/HAVING PROCEDURES NEED COVID-19 SCREENING  Once    Comments: ALL PATIENTS BEING ADMITTED/HAVING PROCEDURES NEED COVID-19 SCREENING  Provider:  (Not yet assigned)  Question Answer Comment  Place call to: [facility]   Reason for Consult Admit      08/28/19 1807          Significant initial  Findings: Abnormal  Labs Reviewed  COMPREHENSIVE METABOLIC PANEL - Abnormal; Notable for the following components:      Result Value   Glucose, Bld 204 (*)    Albumin 3.3 (*)    Total Bilirubin 0.2 (*)    All other components within normal limits  URINALYSIS, ROUTINE W REFLEX MICROSCOPIC - Abnormal; Notable for the following components:   APPearance HAZY (*)    Specific Gravity, Urine 1.033 (*)    Glucose, UA 50 (*)    All other components within normal limits  LACTIC ACID, PLASMA - Abnormal; Notable for the following components:   Lactic Acid,  Venous 2.1 (*)    All other components within normal limits  LACTIC ACID, PLASMA - Abnormal; Notable for the following components:   Lactic Acid, Venous 2.2 (*)    All other components within normal limits  D-DIMER, QUANTITATIVE (NOT AT [facility]) - Abnormal; Notable for the following components:   D-Dimer, Quant 0.52 (*)    All other components within normal limits  TRIGLYCERIDES - Abnormal; Notable for the following components:   Triglycerides 163 (*)    All other components within normal limits  C-REACTIVE PROTEIN - Abnormal; Notable for the following components:   CRP 1.7 (*)    All other components within normal limits     Otherwise labs showing:    Recent Labs  Lab 08/28/19 1100  NA 139  K 3.9  CO2 24  GLUCOSE 204*  BUN 15  CREATININE 0.69  CALCIUM 9.0    Cr   stable,    Lab Results  Component Value Date   CREATININE 0.69 08/28/2019   CREATININE 1.05 (H) 08/16/2019   CREATININE 0.71 05/01/2019    Recent Labs  Lab 08/28/19 1100  AST 17  ALT 41  ALKPHOS 77  BILITOT 0.2*  PROT 6.5  ALBUMIN 3.3*   Lab Results  Component Value Date   CALCIUM 9.0 08/28/2019      WBC      Component Value Date/Time   WBC 10.1 08/28/2019 1100   ANC    Component Value Date/Time   NEUTROABS 6.4 08/28/2019 1100   NEUTROABS 6.7 08/16/2019 0929   ALC No components found for: LYMPHAB   Plt: Lab Results  Component Value Date   PLT 193  08/28/2019   Lactic Acid, Venous    Component Value Date/Time   LATICACIDVEN 2.2 (HH) 08/28/2019 1258    VBG 7.359/32.6   COVID-19 Labs  Recent Labs    08/28/19 1100  DDIMER 0.52*  FERRITIN 233  LDH 159  CRP 1.7*    Lab Results  Component Value Date   SARSCOV2NAA NEGATIVE 08/28/2019     HG/HC stable     Component Value Date/Time   HGB 13.3 08/28/2019 1100   HGB 14.3 08/16/2019 0929   HCT 39.2 08/28/2019 1100   HCT 43.1 08/16/2019 0929    Recent Labs  Lab 08/28/19 1100  LIPASE 34   No results for input(s): AMMONIA in the last 168 hours.  No components found for: LABALBU   Troponin 5    ECG: Ordered Personally reviewed by me showing: HR : 94 Rhythm: NSR,   no evidence of ischemic changes QTC 489   BNP (last 3 results) Recent Labs    09/20/18 2016 12/08/18 1044 08/28/19 1100  BNP 32.9 11.9 26.9    ProBNP (last 3 results) Recent Labs    08/16/19 0929  PROBNP 38    UA   no evidence of UTI      Urine analysis:    Component Value Date/Time   COLORURINE YELLOW 08/28/2019 1626   APPEARANCEUR HAZY (A) 08/28/2019 1626   LABSPEC 1.033 (H) 08/28/2019 1626   PHURINE 5.0 08/28/2019 1626   GLUCOSEU 50 (A) 08/28/2019 1626   HGBUR NEGATIVE 08/28/2019 1626   BILIRUBINUR NEGATIVE 08/28/2019 1626   BILIRUBINUR neg 06/19/2015 1142   KETONESUR NEGATIVE 08/28/2019 1626   PROTEINUR NEGATIVE 08/28/2019 1626   UROBILINOGEN 0.2 06/19/2015 1142   UROBILINOGEN 0.2 08/24/2013 1416   NITRITE NEGATIVE 08/28/2019 1626   LEUKOCYTESUR NEGATIVE 08/28/2019 1626      Ordered  CXR -  stable cardiomegaly  CTabd/pelvis -  nonacute  CTA chest - nonacute, no PE,  no evidence of infiltrate      ED Triage Vitals  Enc Vitals Group     BP 08/28/19 1115 111/89     Pulse Rate 08/28/19 1027 82     Resp 08/28/19 1027 16     Temp 08/28/19 1027 98.5 F (36.9 C)     Temp Source 08/28/19 1027 Oral     SpO2 08/28/19 1022 100 %     Weight --      Height --      Head  Circumference --      Peak Flow --      Pain Score 08/28/19 1310 5     Pain Loc --      Pain Edu? --      Excl. in [place]? --   TMAX(24)@       Latest  Blood pressure (!) 122/95, pulse (!) 123, temperature 98.5 F (36.9 C), temperature source Oral, resp. rate (!) 23, last menstrual period 10/15/2018, SpO2 94 %.    Hospitalist was called for admission for COPD/asthma exacerbation   Review of Systems:    Pertinent positives include: fatigue,  shortness of breath at rest. dyspnea on exertion,  productive cough, excess mucus  Constitutional:  No weight loss, night sweats, Fevers, chills, weight loss  HEENT:  No headaches, Difficulty swallowing,Tooth/dental problems,Sore throat,  No sneezing, itching, ear ache, nasal congestion, post nasal drip,  Cardio-vascular:  No chest pain, Orthopnea, PND, anasarca, dizziness, palpitations.no Bilateral lower extremity swelling  GI:  No heartburn, indigestion, abdominal pain, nausea, vomiting, diarrhea, change in bowel habits, loss of appetite, melena, blood in stool, hematemesis Resp:    no No non-productive cough, No coughing up of blood.No change in color of mucus.No wheezing. Skin:  no rash or lesions. No jaundice GU:  no dysuria, change in color of urine, no urgency or frequency. No straining to urinate.  No flank pain.  Musculoskeletal:  No joint pain or no joint swelling. No decreased range of motion. No back pain.  Psych:  No change in mood or affect. No depression or anxiety. No memory loss.  Neuro: no localizing neurological complaints, no tingling, no weakness, no double vision, no gait abnormality, no slurred speech, no confusion  All systems reviewed and apart from [place] all are negative  Past Medical History:   Past Medical History:  Diagnosis Date   Abdominal hernia    Anginal pain (HCC)    occ from asthma   Anxiety    Arthritis    Asthma    Bipolar affective disorder (HCC)    Schizophrenia   Bronchitis    hx of    CHF (congestive heart failure) (HCC)    Depression    GERD (gastroesophageal reflux disease)    Heart murmur    Hypertension    takes meds   Lymphedema 06/07/2018   Morbid (severe) obesity due to excess calories (HCC)    Neuropathy    left leg , "from back surgery"   Overactive bladder    Schizophrenia ([place])    Sciatica    Shortness of breath    Sleep apnea    Snoring disorder    Pt stated my boyfriend always wakes me up and tells me to breathe      Past Surgical History:  Procedure Laterality Date   BACK SURGERY     3 back surgeries   CHOLECYSTECTOMY  COLONOSCOPY WITH PROPOFOL N/A 01/23/2016   Procedure: COLONOSCOPY WITH PROPOFOL;  Surgeon: Wonda Horner, MD;  Location: [facility];  Service: Endoscopy;  Laterality: N/A;   EYE SURGERY     Metal plate in right eye. Had fracture in right eye   gallstones reomved     KNEE ARTHROSCOPY WITH MENISCAL REPAIR Right 09/28/2016   Procedure: KNEE ARTHROSCOPY WITH MENISCAL REPAIR;  Surgeon: Dorna Leitz, MD;  Location: [facility];  Service: Orthopedics;  Laterality: Right;  Right partial meniscectomy and chondroplasty, patellar/femoral joint and medial femoral condyle    LUMBAR LAMINECTOMY/DECOMPRESSION MICRODISCECTOMY  04/01/2012   Procedure: LUMBAR LAMINECTOMY/DECOMPRESSION MICRODISCECTOMY 2 LEVELS;  Surgeon: Faythe Ghee, MD;  Location: [facility];  Service: Neurosurgery;  Laterality: Left;  Lumbar four-five, lumbar five sacral one microdiscectomy    LUMBAR WOUND DEBRIDEMENT  04/29/2012   Procedure: LUMBAR WOUND DEBRIDEMENT;  Surgeon: Faythe Ghee, MD;  Location: [facility];  Service: Neurosurgery;  Laterality: N/A;  lumbar wound debridement   ROTATOR CUFF REPAIR     Right shoulder   TUBAL LIGATION      Social History:  Ambulatory independently may need walker    reports that she has been smoking cigarettes. She has a 24.00 pack-year smoking history. She has never used smokeless tobacco. She reports  that she does not drink alcohol or use drugs.  Family History:   Family History  Problem Relation Age of Onset   Diabetes Mother    Hypertension Mother    Diabetes Father    Heart disease Paternal Aunt    Cancer Paternal Aunt     Allergies: Allergies  Allergen Reactions   Aspirin Nausea And Vomiting     Prior to Admission medications   Medication Sig Start Date End Date Taking? Authorizing Provider  albuterol (PROAIR HFA) 108 (90 Base) MCG/ACT inhaler Inhale 2 puffs into the lungs every 4 (four) hours as needed for wheezing or shortness of breath.    [provider]  albuterol (PROVENTIL) (2.5 MG/3ML) 0.083% nebulizer solution Take 2.5 mg by nebulization every 4 (four) hours as needed for wheezing or shortness of breath.    [provider]  amitriptyline (ELAVIL) 25 MG tablet Take 25 mg by mouth at bedtime.    [provider]  amLODipine (NORVASC) 10 MG tablet Take 1 tablet (10 mg total) by mouth daily. 05/17/18 08/16/19  [name], MD  ARIPiprazole (ABILIFY) 20 MG tablet Take 1 tablet (20 mg total) by mouth at bedtime. Patient taking differently: Take 20 mg by mouth 2 (two) times daily.  03/04/15   Lance Bosch, NP  bisoprolol (ZEBETA) 5 MG tablet One twice daily 02/13/19   Tanda Rockers, MD  budesonide-formoterol [facility]) 160-4.5 MCG/ACT inhaler Inhale 2 puffs into the lungs 2 (two) times daily. 04/01/18   Parrett, Fonnie Mu, NP  cetirizine (ZYRTEC) 10 MG tablet Take 1 tablet (10 mg total) by mouth daily. 07/20/18   Martyn Ehrich, NP  DULoxetine (CYMBALTA) 30 MG capsule Take 30 mg by mouth 2 (two) times daily.    [provider]  furosemide (LASIX) 80 MG tablet Take 1 tablet (80 mg total) by mouth 2 (two) times daily. 05/17/18   [name], MD  gabapentin (NEURONTIN) 300 MG capsule Take 1 capsule by mouth 3 (three) times daily. 02/02/19   [provider]  HYDROcodone-acetaminophen (NORCO/VICODIN) 5-325 MG tablet Take  1 tablet by mouth as needed. 02/01/19   [provider]  meloxicam (MOBIC) 15 MG  tablet Take 1 tablet (15 mg total) by mouth daily as needed for pain. 05/16/19   Fransico Meadow, PA-C  methocarbamol (ROBAXIN) 500 MG tablet methocarbamol 500 mg tablet  Take 1 tablet(s) every 8 hours by oral route.    [provider]  montelukast (SINGULAIR) 10 MG tablet Take 1 tablet (10 mg total) by mouth at bedtime. 08/17/18   Martyn Ehrich, NP  omeprazole (PRILOSEC) 40 MG capsule Take 1 capsule (40 mg total) by mouth daily. 12/14/17   [name], MD  potassium chloride SA (K-DUR,KLOR-CON) 20 MEQ tablet Take 20 mEq by mouth daily.    [provider]  Respiratory Therapy Supplies (FLUTTER) DEVI Use flutter device 3 times a day 07/20/18   Martyn Ehrich, NP  tiZANidine (ZANAFLEX) 4 MG tablet Take 4 mg by mouth every 8 (eight) hours as needed for spasms. 02/14/18   [provider]  triamterene-hydrochlorothiazide (MAXZIDE-25) 37.5-25 MG tablet Take 1 tablet by mouth daily. 02/14/18   [provider]  valsartan (DIOVAN) 320 MG tablet Take 1 tablet (320 mg total) by mouth daily. 05/20/18   [name], MD   Physical Exam: Blood pressure (!) 122/95, pulse (!) 123, temperature 98.5 F (36.9 C), temperature source Oral, resp. rate (!) 23, last menstrual period 10/15/2018, SpO2 94 %. 1. General:  in No  Acute distress   Chronically ill  -appearing 2. Psychological: Alert and  Oriented 3. Head/ENT:   [employer] residents mechanical patient service order.V  dry Mucous Membranes                          Head Non traumatic, neck supple                          Normal  Dentition 4. SKIN:   decreased Skin turgor,  Skin clean Dry and intact no rash 5. Heart: Regular rate and rhythm no  Murmur, no Rub or gallop 6. Lungs:  no wheezes or crackles   7. Abdomen: Soft non-tender, Non distended   obese  bowel sounds present 8. Lower extremities: no clubbing, cyanosis, bilateral  edema 9. Neurologically Grossly intact, moving all 4 extremities equally   10. MSK: Normal range of motion   All other LABS:     Recent Labs  Lab 08/28/19 1100  WBC 10.1  NEUTROABS 6.4  HGB 13.3  HCT 39.2  MCV 94.5  PLT 193     Recent Labs  Lab 08/28/19 1100  NA 139  K 3.9  CL 104  CO2 24  GLUCOSE 204*  BUN 15  CREATININE 0.69  CALCIUM 9.0     Recent Labs  Lab 08/28/19 1100  AST 17  ALT 41  ALKPHOS 77  BILITOT 0.2*  PROT 6.5  ALBUMIN 3.3*       Cultures:    Component Value Date/Time   SDES URINE, RANDOM 10/23/2016 0228   SPECREQUEST NONE 10/23/2016 0228   CULT MULTIPLE SPECIES PRESENT, SUGGEST RECOLLECTION (A) 10/23/2016 0228   REPTSTATUS 10/24/2016 FINAL 10/23/2016 0228     Radiological Exams on Admission: CT Angio Chest PE W and/or Wo Contrast  Result Date: 08/28/2019 CLINICAL DATA:  Shortness of breath. Elevated D-dimer. Abdominal pain. EXAM: CT ANGIOGRAPHY CHEST CT ABDOMEN AND PELVIS WITH CONTRAST TECHNIQUE: Multidetector CT imaging of the chest was performed using the standard protocol during bolus administration of intravenous contrast. Multiplanar CT image reconstructions and MIPs were obtained to  evaluate the vascular anatomy. Multidetector CT imaging of the abdomen and pelvis was performed using the standard protocol during bolus administration of intravenous contrast. CONTRAST:  130m OMNIPAQUE IOHEXOL 350 MG/ML SOLN COMPARISON:  Chest CTA dated 08/17/2015. Abdomen and pelvis CT dated 04/08/2015. FINDINGS: CTA CHEST FINDINGS Cardiovascular: Satisfactory opacification of the pulmonary arteries to the segmental level. No evidence of pulmonary embolism. Normal heart size. No pericardial effusion. Mediastinum/Nodes: No enlarged mediastinal, hilar, or axillary lymph nodes. Thyroid gland, trachea, and esophagus demonstrate no significant findings. Lungs/Pleura: Bilateral lower lobe subsegmental atelectasis. No pleural fluid. Musculoskeletal: Thoracic spine  degenerative changes, including changes of DISH. Review of the MIP images confirms the above findings. CT ABDOMEN and PELVIS FINDINGS Hepatobiliary: Marked diffuse low density of the liver relative to the spleen. Cholecystectomy clips. Pancreas: Unremarkable. No pancreatic ductal dilatation or surrounding inflammatory changes. Spleen: Normal in size without focal abnormality. Adrenals/Urinary Tract: Stable left adrenal hyperplasia. Unremarkable right adrenal gland, kidneys, ureters and urinary bladder. Stomach/Bowel: Stomach is within normal limits. Appendix appears normal. No evidence of bowel wall thickening, distention, or inflammatory changes. Vascular/Lymphatic: No significant vascular findings are present. No enlarged abdominal or pelvic lymph nodes. Reproductive: Uterus and bilateral adnexa are unremarkable. Other: No abdominal wall hernia or abnormality. No abdominopelvic ascites. Musculoskeletal: Interbody and pedicle screw and rod fusion at the L5-S1 level. Lumbar spine degenerative changes. Review of the MIP images confirms the above findings. IMPRESSION: 1. No pulmonary emboli. 2. No acute abnormality in the chest, abdomen or pelvis. 3. Marked diffuse hepatic steatosis. Electronically Signed   By: SClaudie ReveringM.D.   On: 08/28/2019 16:16   CT ABDOMEN PELVIS W CONTRAST  Result Date: 08/28/2019 CLINICAL DATA:  Shortness of breath. Elevated D-dimer. Abdominal pain. EXAM: CT ANGIOGRAPHY CHEST CT ABDOMEN AND PELVIS WITH CONTRAST TECHNIQUE: Multidetector CT imaging of the chest was performed using the standard protocol during bolus administration of intravenous contrast. Multiplanar CT image reconstructions and MIPs were obtained to evaluate the vascular anatomy. Multidetector CT imaging of the abdomen and pelvis was performed using the standard protocol during bolus administration of intravenous contrast. CONTRAST:  1081mOMNIPAQUE IOHEXOL 350 MG/ML SOLN COMPARISON:  Chest CTA dated 08/17/2015. Abdomen  and pelvis CT dated 04/08/2015. FINDINGS: CTA CHEST FINDINGS Cardiovascular: Satisfactory opacification of the pulmonary arteries to the segmental level. No evidence of pulmonary embolism. Normal heart size. No pericardial effusion. Mediastinum/Nodes: No enlarged mediastinal, hilar, or axillary lymph nodes. Thyroid gland, trachea, and esophagus demonstrate no significant findings. Lungs/Pleura: Bilateral lower lobe subsegmental atelectasis. No pleural fluid. Musculoskeletal: Thoracic spine degenerative changes, including changes of DISH. Review of the MIP images confirms the above findings. CT ABDOMEN and PELVIS FINDINGS Hepatobiliary: Marked diffuse low density of the liver relative to the spleen. Cholecystectomy clips. Pancreas: Unremarkable. No pancreatic ductal dilatation or surrounding inflammatory changes. Spleen: Normal in size without focal abnormality. Adrenals/Urinary Tract: Stable left adrenal hyperplasia. Unremarkable right adrenal gland, kidneys, ureters and urinary bladder. Stomach/Bowel: Stomach is within normal limits. Appendix appears normal. No evidence of bowel wall thickening, distention, or inflammatory changes. Vascular/Lymphatic: No significant vascular findings are present. No enlarged abdominal or pelvic lymph nodes. Reproductive: Uterus and bilateral adnexa are unremarkable. Other: No abdominal wall hernia or abnormality. No abdominopelvic ascites. Musculoskeletal: Interbody and pedicle screw and rod fusion at the L5-S1 level. Lumbar spine degenerative changes. Review of the MIP images confirms the above findings. IMPRESSION: 1. No pulmonary emboli. 2. No acute abnormality in the chest, abdomen or pelvis. 3. Marked diffuse hepatic steatosis. Electronically Signed  By: Claudie Revering M.D.   On: 08/28/2019 16:16   DG Chest Portable 1 View  Result Date: 08/28/2019 CLINICAL DATA:  Shortness of breath and chest pain EXAM: PORTABLE CHEST 1 VIEW COMPARISON:  12/08/2018 FINDINGS: The heart is  mildly enlarged but stable. The mediastinal and hilar contours are within normal limits. The lungs are clear. No pleural effusions or focal infiltrates. IMPRESSION: Mild stable cardiac enlargement but no acute pulmonary findings. Electronically Signed   By: Marijo Sanes M.D.   On: 08/28/2019 10:52    Chart has been reviewed    Assessment/Plan   48 y.o. female with medical history significant of past diastolic heart failure , morbid obesity BUN 50 COPD asthma tobacco abuse lymphedema history of tongue cancer  Admitted for COPD exacerbation   Present on Admission:  COPD with acute exacerbation ([place]) -   Will initiate: Steroid taper  -  Antibiotics azythromycin - Albuterol PRN,    -  Breo or Dulera at discharge   -  Mucinex.  Titrate O2 to saturation >90%. Follow patients respiratory status.    VBG: 7.359/32.6/82  -  PCCM consulted  For morning  Although tested negative for COPD very high pretest possibility    Currently mentating well no evidence of symptomatic hypercarbia   Essential hypertension - continue zebeta and lasix, hold NOrvasc as that can contribute to leg edema   GERD (gastroesophageal reflux disease) -   Bipolar affective disorder ([place]) - continue home medications    Chronic diastolic heart failure ([place]) - continue lasix at this point appears stable   OSA (obstructive sleep apnea) - hold on off on CPAP given high pretest possibility for COVID exposure    Other plan as per orders.  DVT prophylaxis:    Lovenox     Code Status:  FULL CODE  as per patient   I had personally discussed CODE STATUS with patient    Family Communication:   Family not at  Bedside    Disposition Plan:        To home once workup is complete and patient is stable                                     Consults called: left msg for Pulmonology to see in AM  Admission status:  ED Disposition    ED Disposition Condition Comment   Admit  The patient appears reasonably  stabilized for admission considering the current resources, flow, and capabilities available in the ED at this time, and I doubt any other [facility] requiring further screening and/or treatment in the ED prior to admission is  present.       Obs   Level of care        SDU tele indefinitely please discontinue once patient no longer qualifies   Precautions: admitted as  PUI  No active isolations  If Covid PCR is negative   would need additional investigation given very high risk for false native test result repeat COVID testing tomorrow  PPE: Used by the provider:   P100  eye Goggles,  Gloves  gown    Lynsi Dooner 08/28/2019, 10:00 PM    [facility]     after 2 AM please page floor coverage PA If 7AM-7PM, please contact the day team taking care of the patient using [email or web address]

## 2019-08-29 ENCOUNTER — Encounter (HOSPITAL_COMMUNITY): Payer: Self-pay | Admitting: Internal Medicine

## 2019-08-29 DIAGNOSIS — G8929 Other chronic pain: Secondary | ICD-10-CM | POA: Diagnosis present

## 2019-08-29 DIAGNOSIS — K219 Gastro-esophageal reflux disease without esophagitis: Secondary | ICD-10-CM

## 2019-08-29 DIAGNOSIS — E119 Type 2 diabetes mellitus without complications: Secondary | ICD-10-CM

## 2019-08-29 DIAGNOSIS — I5032 Chronic diastolic (congestive) heart failure: Secondary | ICD-10-CM | POA: Diagnosis present

## 2019-08-29 DIAGNOSIS — R042 Hemoptysis: Secondary | ICD-10-CM

## 2019-08-29 DIAGNOSIS — F3170 Bipolar disorder, currently in remission, most recent episode unspecified: Secondary | ICD-10-CM | POA: Diagnosis not present

## 2019-08-29 DIAGNOSIS — I890 Lymphedema, not elsewhere classified: Secondary | ICD-10-CM

## 2019-08-29 DIAGNOSIS — F209 Schizophrenia, unspecified: Secondary | ICD-10-CM | POA: Diagnosis present

## 2019-08-29 DIAGNOSIS — R112 Nausea with vomiting, unspecified: Secondary | ICD-10-CM | POA: Diagnosis present

## 2019-08-29 DIAGNOSIS — M549 Dorsalgia, unspecified: Secondary | ICD-10-CM | POA: Diagnosis present

## 2019-08-29 DIAGNOSIS — J9601 Acute respiratory failure with hypoxia: Principal | ICD-10-CM

## 2019-08-29 DIAGNOSIS — E1165 Type 2 diabetes mellitus with hyperglycemia: Secondary | ICD-10-CM | POA: Diagnosis present

## 2019-08-29 DIAGNOSIS — I10 Essential (primary) hypertension: Secondary | ICD-10-CM

## 2019-08-29 DIAGNOSIS — J440 Chronic obstructive pulmonary disease with acute lower respiratory infection: Secondary | ICD-10-CM | POA: Diagnosis present

## 2019-08-29 DIAGNOSIS — Z20828 Contact with and (suspected) exposure to other viral communicable diseases: Secondary | ICD-10-CM | POA: Diagnosis present

## 2019-08-29 DIAGNOSIS — R197 Diarrhea, unspecified: Secondary | ICD-10-CM | POA: Diagnosis present

## 2019-08-29 DIAGNOSIS — M1990 Unspecified osteoarthritis, unspecified site: Secondary | ICD-10-CM | POA: Diagnosis present

## 2019-08-29 DIAGNOSIS — R0683 Snoring: Secondary | ICD-10-CM | POA: Diagnosis present

## 2019-08-29 DIAGNOSIS — F319 Bipolar disorder, unspecified: Secondary | ICD-10-CM | POA: Diagnosis present

## 2019-08-29 DIAGNOSIS — G4733 Obstructive sleep apnea (adult) (pediatric): Secondary | ICD-10-CM | POA: Diagnosis present

## 2019-08-29 DIAGNOSIS — E1140 Type 2 diabetes mellitus with diabetic neuropathy, unspecified: Secondary | ICD-10-CM | POA: Diagnosis present

## 2019-08-29 DIAGNOSIS — Z23 Encounter for immunization: Secondary | ICD-10-CM | POA: Diagnosis not present

## 2019-08-29 DIAGNOSIS — J209 Acute bronchitis, unspecified: Secondary | ICD-10-CM | POA: Diagnosis present

## 2019-08-29 DIAGNOSIS — J4531 Mild persistent asthma with (acute) exacerbation: Secondary | ICD-10-CM | POA: Diagnosis not present

## 2019-08-29 DIAGNOSIS — J441 Chronic obstructive pulmonary disease with (acute) exacerbation: Secondary | ICD-10-CM | POA: Diagnosis not present

## 2019-08-29 DIAGNOSIS — I110 Hypertensive heart disease with heart failure: Secondary | ICD-10-CM | POA: Diagnosis present

## 2019-08-29 DIAGNOSIS — N3281 Overactive bladder: Secondary | ICD-10-CM | POA: Diagnosis present

## 2019-08-29 LAB — CBC
HCT: 36.3 % (ref 36.0–46.0)
Hemoglobin: 12.2 g/dL (ref 12.0–15.0)
MCH: 31.8 pg (ref 26.0–34.0)
MCHC: 33.6 g/dL (ref 30.0–36.0)
MCV: 94.5 fL (ref 80.0–100.0)
Platelets: 200 10*3/uL (ref 150–400)
RBC: 3.84 MIL/uL — ABNORMAL LOW (ref 3.87–5.11)
RDW: 13.6 % (ref 11.5–15.5)
WBC: 13.3 10*3/uL — ABNORMAL HIGH (ref 4.0–10.5)
nRBC: 0 % (ref 0.0–0.2)

## 2019-08-29 LAB — PHOSPHORUS: Phosphorus: 2.3 mg/dL — ABNORMAL LOW (ref 2.5–4.6)

## 2019-08-29 LAB — RESPIRATORY PANEL BY PCR

## 2019-08-29 LAB — COMPREHENSIVE METABOLIC PANEL
ALT: 38 U/L (ref 0–44)
AST: 22 U/L (ref 15–41)
Albumin: 3.1 g/dL — ABNORMAL LOW (ref 3.5–5.0)
Alkaline Phosphatase: 71 U/L (ref 38–126)
Anion gap: 14 (ref 5–15)
BUN: 13 mg/dL (ref 6–20)
CO2: 19 mmol/L — ABNORMAL LOW (ref 22–32)
Calcium: 8.8 mg/dL — ABNORMAL LOW (ref 8.9–10.3)
Chloride: 104 mmol/L (ref 98–111)
Creatinine, Ser: 0.77 mg/dL (ref 0.44–1.00)
GFR calc Af Amer: >60 mL/min (ref >60)
GFR calc non Af Amer: >60 mL/min (ref >60)
Glucose, Bld: 297 mg/dL — ABNORMAL HIGH (ref 70–99)
Potassium: 4.4 mmol/L (ref 3.5–5.1)
Sodium: 137 mmol/L (ref 135–145)
Total Bilirubin: 0.2 mg/dL — ABNORMAL LOW (ref 0.3–1.2)
Total Protein: 6.1 g/dL — ABNORMAL LOW (ref 6.5–8.1)

## 2019-08-29 LAB — CBG MONITORING, ED
Glucose-Capillary: 179 mg/dL — ABNORMAL HIGH (ref 70–99)
Glucose-Capillary: 217 mg/dL — ABNORMAL HIGH (ref 70–99)
Glucose-Capillary: 268 mg/dL — ABNORMAL HIGH (ref 70–99)
Glucose-Capillary: 271 mg/dL — ABNORMAL HIGH (ref 70–99)
Glucose-Capillary: 276 mg/dL — ABNORMAL HIGH (ref 70–99)
Glucose-Capillary: 298 mg/dL — ABNORMAL HIGH (ref 70–99)
Glucose-Capillary: 336 mg/dL — ABNORMAL HIGH (ref 70–99)
Glucose-Capillary: 364 mg/dL — ABNORMAL HIGH (ref 70–99)

## 2019-08-29 LAB — HEMOGLOBIN A1C
Hgb A1c MFr Bld: 8.7 % — ABNORMAL HIGH (ref 4.8–5.6)
Mean Plasma Glucose: 202.99 mg/dL

## 2019-08-29 LAB — MAGNESIUM: Magnesium: 2.1 mg/dL (ref 1.7–2.4)

## 2019-08-29 LAB — TSH: TSH: 0.331 u[IU]/mL — ABNORMAL LOW (ref 0.350–4.500)

## 2019-08-29 LAB — HIV ANTIBODY (ROUTINE TESTING W REFLEX): HIV Screen 4th Generation wRfx: NONREACTIVE

## 2019-08-29 MED ORDER — HYDRALAZINE HCL 20 MG/ML IJ SOLN: 10.0000 mg | Freq: Three times a day (TID) | INTRAMUSCULAR | Status: DC | PRN

## 2019-08-29 MED ORDER — INSULIN GLARGINE 100 UNIT/ML ~~LOC~~ SOLN
25.0000 [IU] | Freq: Every day | SUBCUTANEOUS | Status: DC
  Administered 2019-08-29: 25 [IU] via SUBCUTANEOUS
  Filled 2019-08-29 (×2): qty 0.25

## 2019-08-29 MED ORDER — LIVING WELL WITH DIABETES BOOK
Freq: Once | Status: DC
  Filled 2019-08-29: qty 1

## 2019-08-29 NOTE — Progress Notes (Addendum)
Inpatient Diabetes Program Recommendations  AACE/ADA: New Consensus Statement on Inpatient Glycemic Control (2015)  Target Ranges:  Prepandial:   less than 140 mg/dL      Peak postprandial:   less than 180 mg/dL (1-2 hours)      Critically ill patients:  140 - 180 mg/dL   Lab Results  Component Value Date   GLUCAP 298 (H) 08/29/2019   HGBA1C 8.7 (H) 08/29/2019    Review of Glycemic Control  Results for SHIESHA, MANCA (MRN [id number]) as of 08/29/2019 10:00  Ref. Range 08/28/2019 22:05 08/29/2019 01:08 08/29/2019 02:27 08/29/2019 04:50 08/29/2019 07:37  Glucose-Capillary Latest Ref Range: 70 - 99 mg/dL 372 (H)  Novolog 15units 364 (H)  Novolog 15units 336 (H)   268 (H)  Novolog 8units 298 (H)  Novolog 8units      Diabetes history: new onset DM2 Outpatient Diabetes medications: None Current orders for Inpatient glycemic control: Novolog 0-15 Q4HR; Decadron 6 mg   Inpatient Diabetes Program Recommendations:   Consider adding Lantus 25 units daily  -Called and spoke with patient about new diagnoses of DM2.  Reviewed patient's current A1c of 8.7% Explained what a A1c is and what it measures. Also reviewed goal A1c with patient, importance of good glucose control @ home, and blood sugar goals.  Patient drinks a lot of regular sodas and eats unlimited carbohydrates.  Educated patient on changing to diet drinks and water and limiting her carbohydrates intake.  Patient does have a PCP.  Ordered Living Well with Diabetes book.  Attached information to DC instructions on diabetes.  Will continue to follow while in the hospital.     Thank you, Geoffry Paradise, RN, BSN Diabetes Coordinator Inpatient Diabetes Program [phone] (team pager from 8a-5p)

## 2019-08-29 NOTE — Consult Note (Signed)
NAME:  ALIZEH DENTINGER, MRN:  [id number], DOB:  1970-11-30, LOS: 0 ADMISSION DATE:  08/28/2019, CONSULTATION DATE: 08/29/2019 REFERRING MD: Roel Cluck, CHIEF COMPLAINT: Hypoxia  Brief History   Shortness of breath x1 month, acute worsening over the weekend.  Has recently been exposed to son, who is positive for COVID-19.  Aches, nausea, vomiting, cough began 5 days ago.  History of present illness   Mrs. Kovacevic is a 48 year old woman with a history of ongoing tobacco abuse and COPD on triple inhaled therapy who presents with acute hypoxic respiratory failure.  She began having symptoms of myalgias, shortness of breath, GI symptoms about 5 days ago.  She was having worsening shortness of breath over the past few days, which had started about a month ago.  When she attempted to go have outpatient Covid testing done her shortness of breath was too severe, and she presented to the emergency department.  She has required about 2 L of oxygen to maintain her saturations since admission, and overnight she was increased to 4 L as she was unable to wear her CPAP for her OSA due to being a Covid PUI.  Her initial 2 tests are negative, but with being close exposure to a known case and her symptomatology, there is concern for false negative results.  This morning she complains of mild shortness of breath, aches, productive cough.  She had one episode of hemoptysis while I was in the room, but this was the first episode.  She has had central chest pain that worsens with coughing, is sharp, and reproducible on palpation for the past few days.  She is not on home oxygen.  She has been compliant with her home Spiriva and Symbicort as prescribed and has been using her albuterol.  She continues to smoke about half a pack per day, and is interested in quitting.  She has chronic lower extremity lymphedema, but reports her edema is worse than baseline.  Past Medical History  COPD OSA on CPAP HFpEF  Significant  Hospital Events   Requiring 2 to 4 L of oxygen  Consults:  PCCM  Procedures:    Significant Diagnostic Tests:  12/14 CTA chest-no PE.  No significant groundglass opacities.  Bibasilar mild consolidation, new since CT abdomen 05/02/2019  Micro Data:  SARS-CoV-2 negative Flu a and B-  Antimicrobials:  Azithromycin 12/14 >> Doxycycline 12/14  Interim history/subjective:  Received dexamethasone and azithromycin.  Currently not a candidate for remdesivir with a negative Covid test.  Objective   Blood pressure (!) 159/89, pulse 75, temperature 97.9 F (36.6 C), temperature source Oral, resp. rate 17, last menstrual period 10/15/2018, SpO2 97 %.        Intake/Output Summary (Last 24 hours) at 08/29/2019 0957 Last data filed at 08/28/2019 1506 Gross per 24 hour  Intake 50 ml  Output --  Net 50 ml   There were no vitals filed for this visit.  Examination: General: Middle-aged woman lying in bed in no acute distress.   HENT: Hood/AT, eyes anicteric Lungs: Mild rhonchi and rales bilaterally.  No significant wheezing.  Tachypneic, but no accessory muscle use.  Mild clear sputum admixed with bright red blood, about 1 teaspoon in size.  Saturating 100% on 2 L nasal cannula. Cardiovascular: Regular rate and rhythm, no murmurs Abdomen: BS, soft, nontender Extremities: Chronic lymphedema, no clubbing or cyanosis.  Significant pitting edema. Neuro: Answers questions appropriately, able to sit unassisted.  Moving all extremities spontaneously.  Face symmetric. Derm: No rashes or  wounds.  Well-healed lumbosacral scar.  Resolved Hospital Problem list     Assessment & Plan:   Acute hypoxic respiratory failure due to acute COPD exacerbation.  The precipitating factor for COPD exacerbation is likely COVID-19, but we have not yet had a positive test.  She is high risk PUI given her symptoms and recent exposure to a known case. -Agree with retesting for Covid this afternoon -Agree with  Decadron or prednisone; okay to hold remdesivir until positive test -Supplemental surgeon as required to maintain saturations greater than 88% or saturations in the mid 80s is adequate with appropriate work of breathing and mentation. -Agree with continuing triple inhaled therapy for COPD-Dulera twice daily and Atrovent 4 times daily -Albuterol as needed -Continue 5-day course of azithromycin -Continue contact and airborne precautions for PUI  Tobacco abuse -Recommend cessation -Agree with nicotine replacement therapy while she is admitted, and I encouraged her ongoing efforts at cessation after discharge.  Small-volume hemoptysis likely due to acute bronchitis with her COPD exacerbation -Please continue anticoagulation -Please notify us if this increases significantly in volume  Best practice:  Diet: Diabetic Pain/Anxiety/Delirium protocol (if indicated): Per primary VAP protocol (if indicated): n/a DVT prophylaxis: Lovenox GI prophylaxis: Pantoprazole Glucose control: Per primary Mobility: Per primary Code Status: Full Family Communication: Per primary Disposition: GMF  Labs   CBC: Recent Labs  Lab 08/28/19 1100 08/28/19 2039 08/29/19 0338  WBC 10.1  --  13.3*  NEUTROABS 6.4  --   --   HGB 13.3 12.9 12.2  HCT 39.2 38.0 36.3  MCV 94.5  --  94.5  PLT 193  --  [id number]    Basic Metabolic Panel: Recent Labs  Lab 08/28/19 1100 08/28/19 1957 08/28/19 2039 08/29/19 0338  NA 139  --  138 137  K 3.9  --  4.1 4.4  CL 104  --   --  104  CO2 24  --   --  19*  GLUCOSE 204*  --   --  297*  BUN 15  --   --  13  CREATININE 0.69  --   --  0.77  CALCIUM 9.0  --   --  8.8*  MG  --  1.8  --  2.1  PHOS  --   --   --  2.3*   GFR: Estimated Creatinine Clearance: 107.8 mL/min (by C-G formula based on SCr of 0.77 mg/dL). Recent Labs  Lab 08/28/19 1100 08/28/19 1258 08/29/19 0338  PROCALCITON <0.10  --   --   WBC 10.1  --  13.3*  LATICACIDVEN 2.1* 2.2*  --     Liver  Function Tests: Recent Labs  Lab 08/28/19 1100 08/29/19 0338  AST 17 22  ALT 41 38  ALKPHOS 77 71  BILITOT 0.2* 0.2*  PROT 6.5 6.1*  ALBUMIN 3.3* 3.1*   Recent Labs  Lab 08/28/19 1100  LIPASE 34   No results for input(s): AMMONIA in the last 168 hours.  ABG    Component Value Date/Time   HCO3 18.4 (L) 08/28/2019 2039   TCO2 19 (L) 08/28/2019 2039   ACIDBASEDEF 6.0 (H) 08/28/2019 2039   O2SAT 96.0 08/28/2019 2039     Coagulation Profile: No results for input(s): INR, PROTIME in the last 168 hours.  Cardiac Enzymes: No results for input(s): CKTOTAL, CKMB, CKMBINDEX, TROPONINI in the last 168 hours.  HbA1C: Hgb A1c MFr Bld  Date/Time Value Ref Range Status  08/29/2019 03:38 AM 8.7 (H) 4.8 - 5.6 % Final  Comment:    (NOTE) Pre diabetes:          5.7%-6.4% Diabetes:              >6.4% Glycemic control for   <7.0% adults with diabetes   03/04/2015 05:32 PM 6.1 (H) <5.7 % Final    Comment:                                                                           According to the ADA Clinical Practice Recommendations for 2011, when HbA1c is used as a screening test:     >=6.5%   Diagnostic of Diabetes Mellitus            (if abnormal result is confirmed)   5.7-6.4%   Increased risk of developing Diabetes Mellitus   References:Diagnosis and Classification of Diabetes Mellitus,Diabetes [id number] 1):S62-S69 and Standards of Medical Care in         Diabetes - 2011,Diabetes [id number] (Suppl 1):S11-S61.       CBG: Recent Labs  Lab 08/28/19 2205 08/29/19 0108 08/29/19 0227 08/29/19 0450 08/29/19 0737  GLUCAP 372* 364* 336* 268* 298*    Review of Systems:   Review of systems positive for fever, myalgias, nausea, vomiting, diarrhea, productive cough with hemoptysis, central chest pain, leg swelling.  Past Medical History  She,  has a past medical history of Abdominal hernia, Anginal pain ([place]), Anxiety, Arthritis, Asthma, Bipolar affective  disorder ([place]), Bronchitis, CHF (congestive heart failure) ([place]), Depression, GERD (gastroesophageal reflux disease), Heart murmur, Hypertension, Lymphedema (06/07/2018), Morbid (severe) obesity due to excess calories ([place]), Neuropathy, Overactive bladder, Schizophrenia ([place]), Sciatica, Shortness of breath, Sleep apnea, and Snoring disorder.   Surgical History    Past Surgical History:  Procedure Laterality Date  . BACK SURGERY     3 back surgeries  . CHOLECYSTECTOMY    . COLONOSCOPY WITH PROPOFOL N/A 01/23/2016   Procedure: COLONOSCOPY WITH PROPOFOL;  Surgeon: Wonda Horner, MD;  Location: [facility];  Service: Endoscopy;  Laterality: N/A;  . EYE SURGERY     Metal plate in right eye. Had fracture in right eye  . gallstones reomved    . KNEE ARTHROSCOPY WITH MENISCAL REPAIR Right 09/28/2016   Procedure: KNEE ARTHROSCOPY WITH MENISCAL REPAIR;  Surgeon: Dorna Leitz, MD;  Location: [facility];  Service: Orthopedics;  Laterality: Right;  Right partial meniscectomy and chondroplasty, patellar/femoral joint and medial femoral condyle   . LUMBAR LAMINECTOMY/DECOMPRESSION MICRODISCECTOMY  04/01/2012   Procedure: LUMBAR LAMINECTOMY/DECOMPRESSION MICRODISCECTOMY 2 LEVELS;  Surgeon: Faythe Ghee, MD;  Location: [facility];  Service: Neurosurgery;  Laterality: Left;  Lumbar four-five, lumbar five sacral one microdiscectomy   . LUMBAR WOUND DEBRIDEMENT  04/29/2012   Procedure: LUMBAR WOUND DEBRIDEMENT;  Surgeon: Faythe Ghee, MD;  Location: [facility];  Service: Neurosurgery;  Laterality: N/A;  lumbar wound debridement  . ROTATOR CUFF REPAIR     Right shoulder  . TUBAL LIGATION       Social History   reports that she has been smoking cigarettes. She has a 24.00 pack-year smoking history. She has never used smokeless tobacco. She reports that she does not drink alcohol or use drugs.   Family History   Her family  history includes Cancer in her paternal aunt; Diabetes in her father and mother; Heart  disease in her paternal aunt; Hypertension in her mother.   Allergies Allergies  Allergen Reactions  . Aspirin Nausea And Vomiting     Home Medications  Prior to Admission medications   Medication Sig Start Date End Date Taking? Authorizing Provider  albuterol (PROAIR HFA) 108 (90 Base) MCG/ACT inhaler Inhale 2 puffs into the lungs every 4 (four) hours as needed for wheezing or shortness of breath.   Yes [provider]  albuterol (PROVENTIL) (2.5 MG/3ML) 0.083% nebulizer solution Take 2.5 mg by nebulization every 4 (four) hours as needed for wheezing or shortness of breath.   Yes [provider]  ARIPiprazole (ABILIFY) 20 MG tablet Take 1 tablet (20 mg total) by mouth at bedtime. Patient taking differently: Take 20 mg by mouth 2 (two) times daily.  03/04/15  Yes Lance Bosch, NP  bisoprolol (ZEBETA) 5 MG tablet One twice daily Patient taking differently: Take 5 mg by mouth 2 (two) times daily.  02/13/19  Yes Tanda Rockers, MD  budesonide-formoterol (SYMBICORT) 160-4.5 MCG/ACT inhaler Inhale 2 puffs into the lungs 2 (two) times daily. 04/01/18  Yes Parrett, [name] S, NP  diclofenac Sodium (VOLTAREN) 1 % GEL Apply 1 application topically 2 (two) times daily. For knee and back pain   Yes [provider]  DULoxetine (CYMBALTA) 30 MG capsule Take 30 mg by mouth 2 (two) times daily.   Yes [provider]  furosemide (LASIX) 80 MG tablet Take 1 tablet (80 mg total) by mouth 2 (two) times daily. 05/17/18  Yes [name], MD  gabapentin (NEURONTIN) 300 MG capsule Take 300 mg by mouth 3 (three) times daily.  02/02/19  Yes [provider]  HYDROcodone-acetaminophen (NORCO/VICODIN) 5-325 MG tablet Take 1-2 tablets by mouth See admin instructions. Take 2 tablets by mouth daily at bedtime, may also take 1-2 tablets every 4 hours as needed for pain 02/01/19  Yes [provider]  hydrOXYzine (VISTARIL) 25 MG capsule Take 25 mg by mouth at bedtime.  08/23/19  Yes [provider]  meloxicam (MOBIC) 15 MG tablet Take 1 tablet (15 mg total) by mouth daily as needed for pain. Patient taking differently: Take 15 mg by mouth daily.  05/16/19  Yes Caryl Ada K, PA-C  methocarbamol (ROBAXIN) 500 MG tablet Take 500 mg by mouth 2 (two) times daily.    Yes [provider]  omeprazole (PRILOSEC) 40 MG capsule Take 1 capsule (40 mg total) by mouth daily. 12/14/17  Yes [name], MD  potassium chloride SA (K-DUR,KLOR-CON) 20 MEQ tablet Take 20 mEq by mouth 3 (three) times a week.    Yes [provider]  PRESCRIPTION MEDICATION Inhale into the lungs at bedtime. CPAP   Yes [provider]  Triamcinolone Acetonide 0.025 % LOTN Apply 1 application topically 3 (three) times daily.   Yes [provider]  triamterene-hydrochlorothiazide (MAXZIDE-25) 37.5-25 MG tablet Take 1 tablet by mouth daily. 02/14/18  Yes [provider]  valsartan (DIOVAN) 80 MG tablet Take 80 mg by mouth daily. 08/09/19  Yes [provider]  amLODipine (NORVASC) 10 MG tablet Take 1 tablet (10 mg total) by mouth daily. Patient not taking: Reported on 08/28/2019 05/17/18 08/16/19  [name], MD  montelukast (SINGULAIR) 10 MG tablet Take 1 tablet (10 mg total) by mouth at bedtime. Patient not taking: Reported on 08/28/2019 08/17/18   Martyn Ehrich, NP  Respiratory Therapy Supplies (  FLUTTER) DEVI Use flutter device 3 times a day 07/20/18   Martyn Ehrich, NP     Julian Hy, DO 08/29/19 9:57 AM [facility]

## 2019-08-29 NOTE — Evaluation (Addendum)
Physical Therapy Evaluation Patient Details Name: Lori Bean MRN: [id number] DOB: Jul 05, 1971 Today's Date: 08/29/2019   History of Present Illness  Lori Bean is a 48 y.o. female with medical history significant of past diastolic heart failure , morbid obesity BUN 50 COPD asthma tobacco abuse lymphedema history of tongue cancer CPAP with O2 at night.  She is admitted due to SOB and chest pain with recent Covid exposure and seen two weeks ago for acute bronchitis.  Clinical Impression  Patient presents with decreased mobility due to LE weakness and decreased activity tolerance.  Able to demonstrate SpO2 WNL with ambulation in room on RA, but with audible wheeze and some coughing with clear sputum.  Currently min a for ambulation due to LE buckling/tremors with ambulation, but no specific LOB.  Performed 5x sit>stand with S and encouraged to continue to perform after each trip to [facility] to help with LE weakness.  Feel she will benefit from skilled PT in the acute setting to allow return home with spouse/significant other assist.  Would benefit from follow up [place] if able to qualify for charity care.  Recommend 3:1 for home.     Follow Up Recommendations Home health PT;Supervision for mobility/OOB    Equipment Recommendations  3in1 (PT)    Recommendations for Other Services       Precautions / Restrictions Precautions Precautions: Fall Precaution Comments: reports 5 falls in 6 months due to imbalance      Mobility  Bed Mobility Overal bed mobility: Modified Independent                Transfers Overall transfer level: Needs assistance Equipment used: Rolling walker (2 wheeled) Transfers: Sit to/from [facility] Sit to Stand: Supervision Stand pivot transfers: Supervision       General transfer comment: assist for safety/lines; up from stretcher and to [facility] initially  Ambulation/Gait Ambulation/Gait assistance: Min assist Gait Distance  (Feet): 30 Feet Assistive device: Rolling walker (2 wheeled) Gait Pattern/deviations: Step-to pattern;Step-through pattern;Shuffle;Decreased stride length     General Gait Details: LE's buckling and shaky throughout, no specific LOB, but assist due to weakness  Stairs            Wheelchair Mobility    Modified Rankin (Stroke Patients Only)       Balance Overall balance assessment: Needs assistance   Sitting balance-Leahy Scale: Good     Standing balance support: Bilateral upper extremity supported Standing balance-Leahy Scale: Poor Standing balance comment: UE support for balance                             Pertinent Vitals/Pain Pain Assessment: Faces Faces Pain Scale: Hurts little more Pain Location: L shoulder with elevation Pain Descriptors / Indicators: Discomfort;Sore;Tender Pain Intervention(s): Monitored during session;Repositioned    Home Living Family/patient expects to be discharged to:: Private residence Living Arrangements: Spouse/significant other(boyfriend) Available Help at Discharge: Family Type of Home: House Home Access: Stairs to enter   Technical brewer of Steps: 1 Home Layout: One level Home Equipment: Clinical cytogeneticist - 2 wheels;Cane - single point      Prior Function Level of Independence: Independent               Hand Dominance        Extremity/Trunk Assessment   Upper Extremity Assessment Upper Extremity Assessment: Overall WFL for tasks assessed    Lower Extremity Assessment Lower Extremity Assessment: LLE deficits/detail;RLE deficits/detail RLE Deficits /  Details: AROM WFL, strength hip flexion 4-/5, knee extension 4/5, ankle DF 4+/5 LLE Deficits / Details: AROM WFL, strength hip flexion 3/5, knee extension 4/5, ankle DF 4+/5 LLE Sensation: decreased light touch(reports numbness on lateral foot from prior to surgery to back)       Communication   Communication: No difficulties  Cognition  Arousal/Alertness: Awake/alert Behavior During Therapy: WFL for tasks assessed/performed Overall Cognitive Status: Within Functional Limits for tasks assessed                                        General Comments General comments (skin integrity, edema, etc.): SpO2 on RA with ambulation 92%, HR 82, BP 160/90    Exercises Other Exercises Other Exercises: sit<>stand x 5 with UE support on walker   Assessment/Plan    PT Assessment Patient needs continued PT services  PT Problem List Decreased strength;Decreased activity tolerance;Decreased mobility;Decreased balance;Decreased knowledge of use of DME;Decreased safety awareness;Decreased knowledge of precautions       PT Treatment Interventions DME instruction;Gait training;Therapeutic activities;Stair training;Functional mobility training;Therapeutic exercise;Patient/family education;Balance training    PT Goals (Current goals can be found in the Care Plan section)  Acute Rehab PT Goals Patient Stated Goal: to go home PT Goal Formulation: With patient Time For Goal Achievement: 09/12/19 Potential to Achieve Goals: Good    Frequency Min 3X/week   Barriers to discharge        Co-evaluation               AM-PAC PT "6 Clicks" Mobility  Outcome Measure Help needed turning from your back to your side while in a flat bed without using bedrails?: None Help needed moving from lying on your back to sitting on the side of a flat bed without using bedrails?: None Help needed moving to and from a bed to a chair (including a wheelchair)?: None Help needed standing up from a chair using your arms (e.g., wheelchair or bedside chair)?: None Help needed to walk in hospital room?: A Little Help needed climbing 3-5 steps with a railing? : A Little 6 Click Score: 22    End of Session   Activity Tolerance: Patient limited by fatigue Patient left: in bed;with call bell/phone within reach   PT Visit Diagnosis: Muscle  weakness (generalized) (M62.81);Difficulty in walking, not elsewhere classified (R26.2)    Time: 1105-1130 PT Time Calculation (min) (ACUTE ONLY): 25 min   Charges:   PT Evaluation $PT Eval Moderate Complexity: 1 Mod PT Treatments $Gait Training: 8-22 mins        Lori Bean Acute Rehabilitation Services [phone] 08/29/2019   Reginia Naas 08/29/2019, 1:31 PM

## 2019-08-29 NOTE — Progress Notes (Signed)
[name]  PROGRESS NOTE    Lori Bean  [id number] DOB: [birth year] DOA: 08/28/2019 PCP: [name], PA   Brief Narrative:   Lori Bean is a 48 y.o. female with medical history significant of past diastolic heart failure , morbid obesity BUN 50 COPD asthma tobacco abuse lymphedema history of tongue cancer CPAP with O2 at night Presented with 2 weeks ago shows endorsing shortness of breath productive cough thick sputum she was seen by cardiology and diagnosed with acute bronchitis Cardiology ordered a Z-Pak She continued to have progressive shortness of breath and 2 days ago started dose of chest pain history of constant worse of coughing.  She denies any hemoptysis.  Pain is more sharp sensation repots nausea Vomiting for the past few days after she cough   Diarrhea for 1 month She has chronic lower extremity swelling due to lymphedema check and change Few days ago she was exposed to her son 3 days ago who has tested positive for Covid .  Patient states she herself and her daughter were trying to help her elderly mother who has fallen.  At that time her son has already was symptomatic but did not noted that he was positive for Covid he only tested next day in result came back positive.  08/29/19: Respiratory status better today.    Assessment & Plan:   Active Problems:   Essential hypertension   Bipolar affective disorder (HCC)   GERD (gastroesophageal reflux disease)   Chronic diastolic heart failure (HCC)   OSA (obstructive sleep apnea)   Lymphedema   COPD with acute exacerbation (HCC)   COPD exacerbation (HCC)  COPD with acute exacerbation     - Steroid taper, azithromycin, mucinex, Albuterol PRN     - dulera BID, atrovent QID    - PCCM onboard, appreciate assistance     - retest for COVID; remains PUI  Essential hypertension     - continue zebeta, lasix     - her home amlodipine is being held  GERD     - protonix   Bipolar affective disorder    - abilitfy, cymbalta   Chronic diastolic heart failure     - lasix  Morbid obesity OSA     - hold on off on CPAP given high pretest possibility for COVID exposure  DM2     - A1c 8.1     - start lantus 25units per diabetic coordinator recs     - continue SSI  DVT prophylaxis: lovenox Code Status: FULL   Disposition Plan: TBD  Consultants:   PCCM  Antimicrobials:  . azithromycin   ROS:  Denies CP, N, V, ab pain . Remainder 10-pt ROS is negative for all not previously mentioned.  Subjective: "It isn't as bad."  Objective: Vitals:   08/29/19 1315 08/29/19 1345 08/29/19 1445 08/29/19 1500  BP: (!) 128/94 (!) 161/89 (!) 169/95 (!) 152/97  Pulse: 76 66 71 66  Resp: (!) 29 20 (!) 22 19  Temp:      TempSrc:      SpO2: 96% 97% 96% 92%   No intake or output data in the 24 hours ending 08/29/19 1511 There were no vitals filed for this visit.  Examination:  General: 48 y.o. female resting in bed in NAD Cardiovascular: RRR, +S1, S2, no m/g/r, equal pulses throughout Respiratory: b/l rhales, tachypneic, no wheeze GI: BS+, NDNT, no masses noted, obese MSK: ble edema Neuro: A&O x 3, no focal deficits Psyc: Appropriate interaction and  affect, calm/cooperative   Data Reviewed: I have personally reviewed following labs and imaging studies.  CBC: Recent Labs  Lab 08/28/19 1100 08/28/19 2039 08/29/19 0338  WBC 10.1  --  13.3*  NEUTROABS 6.4  --   --   HGB 13.3 12.9 12.2  HCT 39.2 38.0 36.3  MCV 94.5  --  94.5  PLT 193  --  [id number]   Basic Metabolic Panel: Recent Labs  Lab 08/28/19 1100 08/28/19 1957 08/28/19 2039 08/29/19 0338  NA 139  --  138 137  K 3.9  --  4.1 4.4  CL 104  --   --  104  CO2 24  --   --  19*  GLUCOSE 204*  --   --  297*  BUN 15  --   --  13  CREATININE 0.69  --   --  0.77  CALCIUM 9.0  --   --  8.8*  MG  --  1.8  --  2.1  PHOS  --   --   --  2.3*   GFR: Estimated Creatinine Clearance: 107.8 mL/min (by C-G formula based on SCr of  0.77 mg/dL). Liver Function Tests: Recent Labs  Lab 08/28/19 1100 08/29/19 0338  AST 17 22  ALT 41 38  ALKPHOS 77 71  BILITOT 0.2* 0.2*  PROT 6.5 6.1*  ALBUMIN 3.3* 3.1*   Recent Labs  Lab 08/28/19 1100  LIPASE 34   No results for input(s): AMMONIA in the last 168 hours. Coagulation Profile: No results for input(s): INR, PROTIME in the last 168 hours. Cardiac Enzymes: No results for input(s): CKTOTAL, CKMB, CKMBINDEX, TROPONINI in the last 168 hours. BNP (last 3 results) Recent Labs    08/16/19 0929  PROBNP 38   HbA1C: Recent Labs    08/29/19 0338  HGBA1C 8.7*   CBG: Recent Labs  Lab 08/29/19 0108 08/29/19 0227 08/29/19 0450 08/29/19 0737 08/29/19 1130  GLUCAP 364* 336* 268* 298* 276*   Lipid Profile: Recent Labs    08/28/19 1100  TRIG 163*   Thyroid Function Tests: Recent Labs    08/29/19 0338  TSH 0.331*   Anemia Panel: Recent Labs    08/28/19 1100  FERRITIN 233   Sepsis Labs: Recent Labs  Lab 08/28/19 1100 08/28/19 1258  PROCALCITON <0.10  --   LATICACIDVEN 2.1* 2.2*    Recent Results (from the past 240 hour(s))  Blood Culture (routine x 2)     Status: None (Preliminary result)   Collection Time: 08/28/19 11:00 AM   Specimen: BLOOD  Result Value Ref Range Status   Specimen Description BLOOD LEFT ANTECUBITAL  Final   Special Requests   Final    BOTTLES DRAWN AEROBIC AND ANAEROBIC Blood Culture adequate volume   Culture   Final    NO GROWTH 1 DAY Performed at [facility], [place] [address], [place], [place] [address]    Report Status PENDING  Incomplete  Respiratory Panel by RT PCR (Flu A&B, Covid) - Nasopharyngeal Swab     Status: None   Collection Time: 08/28/19  1:46 PM   Specimen: Nasopharyngeal Swab  Result Value Ref Range Status   SARS Coronavirus 2 by RT PCR NEGATIVE NEGATIVE Final    Comment: (NOTE) SARS-CoV-2 target nucleic acids are NOT DETECTED. The SARS-CoV-2 RNA is generally detectable in upper  respiratoy specimens during the acute phase of infection. The lowest concentration of SARS-CoV-2 viral copies this assay can detect is 131 copies/mL. A negative result does  not preclude SARS-Cov-2 infection and should not be used as the sole basis for treatment or other patient management decisions. A negative result may occur with  improper specimen collection/handling, submission of specimen other than nasopharyngeal swab, presence of viral mutation(s) within the areas targeted by this assay, and inadequate number of viral copies (<131 copies/mL). A negative result must be combined with clinical observations, patient history, and epidemiological information. The expected result is Negative. Fact Sheet for Patients:  PinkCheek.be Fact Sheet for Healthcare Providers:  GravelBags.it This test is not yet ap proved or cleared by the [place] FDA and  has been authorized for detection and/or diagnosis of SARS-CoV-2 by FDA under an Emergency Use Authorization (EUA). This EUA will remain  in effect (meaning this test can be used) for the duration of the COVID-19 declaration under Section 564(b)(1) of the Act, 21 U.S.C. section 360bbb-3(b)(1), unless the authorization is terminated or revoked sooner.    Influenza A by PCR NEGATIVE NEGATIVE Final   Influenza B by PCR NEGATIVE NEGATIVE Final    Comment: (NOTE) The Xpert Xpress SARS-CoV-2/FLU/RSV assay is intended as an aid in  the diagnosis of influenza from Nasopharyngeal swab specimens and  should not be used as a sole basis for treatment. Nasal washings and  aspirates are unacceptable for Xpert Xpress SARS-CoV-2/FLU/RSV  testing. Fact Sheet for Patients: PinkCheek.be Fact Sheet for Healthcare Providers: GravelBags.it This test is not yet approved or cleared by the [place] FDA and  has been authorized for  detection and/or diagnosis of SARS-CoV-2 by  FDA under an Emergency Use Authorization (EUA). This EUA will remain  in effect (meaning this test can be used) for the duration of the  Covid-19 declaration under Section 564(b)(1) of the Act, 21  U.S.C. section 360bbb-3(b)(1), unless the authorization is  terminated or revoked. Performed at [facility], [place] [address], [place], [place] [address]   Respiratory Panel by PCR     Status: None   Collection Time: 08/28/19  1:46 PM   Specimen: Nasopharyngeal Swab; Respiratory  Result Value Ref Range Status   Adenovirus NOT DETECTED NOT DETECTED Final   Coronavirus 229E NOT DETECTED NOT DETECTED Final    Comment: (NOTE) The Coronavirus on the Respiratory Panel, DOES NOT test for the novel  Coronavirus (2019 nCoV)    Coronavirus HKU1 NOT DETECTED NOT DETECTED Final   Coronavirus NL63 NOT DETECTED NOT DETECTED Final   Coronavirus OC43 NOT DETECTED NOT DETECTED Final   Metapneumovirus NOT DETECTED NOT DETECTED Final   Rhinovirus / Enterovirus NOT DETECTED NOT DETECTED Final   Influenza A NOT DETECTED NOT DETECTED Final   Influenza B NOT DETECTED NOT DETECTED Final   Parainfluenza Virus 1 NOT DETECTED NOT DETECTED Final   Parainfluenza Virus 2 NOT DETECTED NOT DETECTED Final   Parainfluenza Virus 3 NOT DETECTED NOT DETECTED Final   Parainfluenza Virus 4 NOT DETECTED NOT DETECTED Final   Respiratory Syncytial Virus NOT DETECTED NOT DETECTED Final   Bordetella pertussis NOT DETECTED NOT DETECTED Final   Chlamydophila pneumoniae NOT DETECTED NOT DETECTED Final   Mycoplasma pneumoniae NOT DETECTED NOT DETECTED Final    Comment: Performed at [facility], [place]. [address], [place], [place] [address]  Blood Culture (routine x 2)     Status: None (Preliminary result)   Collection Time: 08/28/19  2:18 PM   Specimen: BLOOD  Result Value Ref Range Status   Specimen Description BLOOD RIGHT ANTECUBITAL  Final   Special Requests  Final     BOTTLES DRAWN AEROBIC ONLY Blood Culture results may not be optimal due to an inadequate volume of blood received in culture bottles   Culture   Final    NO GROWTH < 24 HOURS Performed at [facility] [address] [place], [place] [address]    Report Status PENDING  Incomplete      Radiology Studies: CT Angio Chest PE W and/or Wo Contrast  Result Date: 08/28/2019 CLINICAL DATA:  Shortness of breath. Elevated D-dimer. Abdominal pain. EXAM: CT ANGIOGRAPHY CHEST CT ABDOMEN AND PELVIS WITH CONTRAST TECHNIQUE: Multidetector CT imaging of the chest was performed using the standard protocol during bolus administration of intravenous contrast. Multiplanar CT image reconstructions and MIPs were obtained to evaluate the vascular anatomy. Multidetector CT imaging of the abdomen and pelvis was performed using the standard protocol during bolus administration of intravenous contrast. CONTRAST:  118mL OMNIPAQUE IOHEXOL 350 MG/ML SOLN COMPARISON:  Chest CTA dated 08/17/2015. Abdomen and pelvis CT dated 04/08/2015. FINDINGS: CTA CHEST FINDINGS Cardiovascular: Satisfactory opacification of the pulmonary arteries to the segmental level. No evidence of pulmonary embolism. Normal heart size. No pericardial effusion. Mediastinum/Nodes: No enlarged mediastinal, hilar, or axillary lymph nodes. Thyroid gland, trachea, and esophagus demonstrate no significant findings. Lungs/Pleura: Bilateral lower lobe subsegmental atelectasis. No pleural fluid. Musculoskeletal: Thoracic spine degenerative changes, including changes of DISH. Review of the MIP images confirms the above findings. CT ABDOMEN and PELVIS FINDINGS Hepatobiliary: Marked diffuse low density of the liver relative to the spleen. Cholecystectomy clips. Pancreas: Unremarkable. No pancreatic ductal dilatation or surrounding inflammatory changes. Spleen: Normal in size without focal abnormality. Adrenals/Urinary Tract: Stable left adrenal hyperplasia. Unremarkable  right adrenal gland, kidneys, ureters and urinary bladder. Stomach/Bowel: Stomach is within normal limits. Appendix appears normal. No evidence of bowel wall thickening, distention, or inflammatory changes. Vascular/Lymphatic: No significant vascular findings are present. No enlarged abdominal or pelvic lymph nodes. Reproductive: Uterus and bilateral adnexa are unremarkable. Other: No abdominal wall hernia or abnormality. No abdominopelvic ascites. Musculoskeletal: Interbody and pedicle screw and rod fusion at the L5-S1 level. Lumbar spine degenerative changes. Review of the MIP images confirms the above findings. IMPRESSION: 1. No pulmonary emboli. 2. No acute abnormality in the chest, abdomen or pelvis. 3. Marked diffuse hepatic steatosis. Electronically Signed   By: Claudie Revering M.D.   On: 08/28/2019 16:16   CT ABDOMEN PELVIS W CONTRAST  Result Date: 08/28/2019 CLINICAL DATA:  Shortness of breath. Elevated D-dimer. Abdominal pain. EXAM: CT ANGIOGRAPHY CHEST CT ABDOMEN AND PELVIS WITH CONTRAST TECHNIQUE: Multidetector CT imaging of the chest was performed using the standard protocol during bolus administration of intravenous contrast. Multiplanar CT image reconstructions and MIPs were obtained to evaluate the vascular anatomy. Multidetector CT imaging of the abdomen and pelvis was performed using the standard protocol during bolus administration of intravenous contrast. CONTRAST:  151mL OMNIPAQUE IOHEXOL 350 MG/ML SOLN COMPARISON:  Chest CTA dated 08/17/2015. Abdomen and pelvis CT dated 04/08/2015. FINDINGS: CTA CHEST FINDINGS Cardiovascular: Satisfactory opacification of the pulmonary arteries to the segmental level. No evidence of pulmonary embolism. Normal heart size. No pericardial effusion. Mediastinum/Nodes: No enlarged mediastinal, hilar, or axillary lymph nodes. Thyroid gland, trachea, and esophagus demonstrate no significant findings. Lungs/Pleura: Bilateral lower lobe subsegmental atelectasis. No  pleural fluid. Musculoskeletal: Thoracic spine degenerative changes, including changes of DISH. Review of the MIP images confirms the above findings. CT ABDOMEN and PELVIS FINDINGS Hepatobiliary: Marked diffuse low density of the liver relative to the spleen. Cholecystectomy clips. Pancreas:  Unremarkable. No pancreatic ductal dilatation or surrounding inflammatory changes. Spleen: Normal in size without focal abnormality. Adrenals/Urinary Tract: Stable left adrenal hyperplasia. Unremarkable right adrenal gland, kidneys, ureters and urinary bladder. Stomach/Bowel: Stomach is within normal limits. Appendix appears normal. No evidence of bowel wall thickening, distention, or inflammatory changes. Vascular/Lymphatic: No significant vascular findings are present. No enlarged abdominal or pelvic lymph nodes. Reproductive: Uterus and bilateral adnexa are unremarkable. Other: No abdominal wall hernia or abnormality. No abdominopelvic ascites. Musculoskeletal: Interbody and pedicle screw and rod fusion at the L5-S1 level. Lumbar spine degenerative changes. Review of the MIP images confirms the above findings. IMPRESSION: 1. No pulmonary emboli. 2. No acute abnormality in the chest, abdomen or pelvis. 3. Marked diffuse hepatic steatosis. Electronically Signed   By: Claudie Revering M.D.   On: 08/28/2019 16:16   DG Chest Portable 1 View  Result Date: 08/28/2019 CLINICAL DATA:  Shortness of breath and chest pain EXAM: PORTABLE CHEST 1 VIEW COMPARISON:  12/08/2018 FINDINGS: The heart is mildly enlarged but stable. The mediastinal and hilar contours are within normal limits. The lungs are clear. No pleural effusions or focal infiltrates. IMPRESSION: Mild stable cardiac enlargement but no acute pulmonary findings. Electronically Signed   By: Marijo Sanes M.D.   On: 08/28/2019 10:52     Scheduled Meds: . amitriptyline  25 mg Oral QHS  . ARIPiprazole  20 mg Oral QHS  . [START ON 08/30/2019] azithromycin  500 mg Oral Daily   . bisoprolol  5 mg Oral BID  . DULoxetine  30 mg Oral BID  . enoxaparin (LOVENOX) injection  40 mg Subcutaneous Q24H  . furosemide  80 mg Oral BID  . gabapentin  300 mg Oral TID  . insulin aspart  0-15 Units Subcutaneous Q4H  . ipratropium  2 puff Inhalation Q6H  . mometasone-formoterol  1 puff Inhalation BID  . nicotine  21 mg Transdermal Daily  . pantoprazole  40 mg Oral Daily  . [START ON 08/30/2019] predniSONE  40 mg Oral Q breakfast  . sodium chloride flush  3 mL Intravenous Q12H  . sodium chloride flush  3 mL Intravenous Q12H   Continuous Infusions: . sodium chloride       LOS: 0 days    Time spent: 25 minutes spent in the coordination of care today.    Jonnie Finner, DO [facility] Pager [phone]  If 7PM-7AM, please contact night-coverage [email or web address] Password [email or web address] 08/29/2019, 3:11 PM

## 2019-08-29 NOTE — ED Notes (Signed)
Paged floor coverage provider due to pt's high glucose. Given verbal orders to recheck sugar at 0400 and give insulin per sliding scale. Will continue to monitor

## 2019-08-29 NOTE — ED Notes (Signed)
Lunch Tray Ordered @ 1126.  

## 2019-08-29 NOTE — ED Notes (Signed)
Pt. Ambulated around room. Complained of dizziness and SOB. Oxygen saturation stayed at 96& on room air.

## 2019-08-29 NOTE — ED Notes (Signed)
Patient's bed was filled with feces. This tech placed new bed sheets and assisted pt with bed bath.

## 2019-08-29 NOTE — ED Notes (Signed)
Increased pt's 02 to 4L Lori Bean due to pt stating she feels like she can't breathe when sleeping. Normally wears CPAP at home during the night, but after speaking with MD, pt is unable to wear CPAP due to being a PUI for COVID.

## 2019-08-29 NOTE — ED Notes (Signed)
Family at bedside. 

## 2019-08-30 ENCOUNTER — Encounter (HOSPITAL_COMMUNITY): Payer: Self-pay | Admitting: Internal Medicine

## 2019-08-30 ENCOUNTER — Telehealth: Payer: Self-pay

## 2019-08-30 DIAGNOSIS — J4531 Mild persistent asthma with (acute) exacerbation: Secondary | ICD-10-CM

## 2019-08-30 LAB — COMPREHENSIVE METABOLIC PANEL
ALT: 39 U/L (ref 0–44)
AST: 16 U/L (ref 15–41)
Albumin: 3.2 g/dL — ABNORMAL LOW (ref 3.5–5.0)
Alkaline Phosphatase: 69 U/L (ref 38–126)
Anion gap: 10 (ref 5–15)
BUN: 18 mg/dL (ref 6–20)
CO2: 29 mmol/L (ref 22–32)
Calcium: 8.7 mg/dL — ABNORMAL LOW (ref 8.9–10.3)
Chloride: 101 mmol/L (ref 98–111)
Creatinine, Ser: 0.92 mg/dL (ref 0.44–1.00)
GFR calc Af Amer: >60 mL/min (ref >60)
GFR calc non Af Amer: >60 mL/min (ref >60)
Glucose, Bld: 186 mg/dL — ABNORMAL HIGH (ref 70–99)
Potassium: 3.8 mmol/L (ref 3.5–5.1)
Sodium: 140 mmol/L (ref 135–145)
Total Bilirubin: 0.1 mg/dL — ABNORMAL LOW (ref 0.3–1.2)
Total Protein: 6.5 g/dL (ref 6.5–8.1)

## 2019-08-30 LAB — CBC WITH DIFFERENTIAL/PLATELET
Abs Immature Granulocytes: 0.11 10*3/uL — ABNORMAL HIGH (ref 0.00–0.07)
Basophils Absolute: 0 10*3/uL (ref 0.0–0.1)
Basophils Relative: 0 %
Eosinophils Absolute: 0 10*3/uL (ref 0.0–0.5)
Eosinophils Relative: 0 %
HCT: 41.3 % (ref 36.0–46.0)
Hemoglobin: 13.5 g/dL (ref 12.0–15.0)
Immature Granulocytes: 1 %
Lymphocytes Relative: 24 %
Lymphs Abs: 3.1 10*3/uL (ref 0.7–4.0)
MCH: 31.5 pg (ref 26.0–34.0)
MCHC: 32.7 g/dL (ref 30.0–36.0)
MCV: 96.3 fL (ref 80.0–100.0)
Monocytes Absolute: 1.1 10*3/uL — ABNORMAL HIGH (ref 0.1–1.0)
Monocytes Relative: 9 %
Neutro Abs: 8.2 10*3/uL — ABNORMAL HIGH (ref 1.7–7.7)
Neutrophils Relative %: 66 %
Platelets: 219 10*3/uL (ref 150–400)
RBC: 4.29 MIL/uL (ref 3.87–5.11)
RDW: 13.7 % (ref 11.5–15.5)
WBC: 12.5 10*3/uL — ABNORMAL HIGH (ref 4.0–10.5)
nRBC: 0 % (ref 0.0–0.2)

## 2019-08-30 LAB — GLUCOSE, CAPILLARY
Glucose-Capillary: 193 mg/dL — ABNORMAL HIGH (ref 70–99)
Glucose-Capillary: 179 mg/dL — ABNORMAL HIGH (ref 70–99)
Glucose-Capillary: 246 mg/dL — ABNORMAL HIGH (ref 70–99)
Glucose-Capillary: 222 mg/dL — ABNORMAL HIGH (ref 70–99)
Glucose-Capillary: 153 mg/dL — ABNORMAL HIGH (ref 70–99)

## 2019-08-30 LAB — D-DIMER, QUANTITATIVE: D-Dimer, Quant: 0.47 ug/mL-FEU (ref 0.00–0.50)

## 2019-08-30 LAB — C-REACTIVE PROTEIN: CRP: 1.2 mg/dL — ABNORMAL HIGH (ref <1.0)

## 2019-08-30 LAB — FERRITIN: Ferritin: 294 ng/mL (ref 11–307)

## 2019-08-30 LAB — MAGNESIUM: Magnesium: 2.1 mg/dL (ref 1.7–2.4)

## 2019-08-30 LAB — SARS CORONAVIRUS 2 (TAT 6-24 HRS): SARS Coronavirus 2: NEGATIVE

## 2019-08-30 MED ORDER — ENOXAPARIN SODIUM 80 MG/0.8ML ~~LOC~~ SOLN
0.5000 mg/kg | SUBCUTANEOUS | Status: DC
  Administered 2019-08-30: 65 mg via SUBCUTANEOUS
  Filled 2019-08-30: qty 0.8

## 2019-08-30 MED ORDER — GLIMEPIRIDE 1 MG PO TABS
1.0000 mg | ORAL_TABLET | Freq: Every day | ORAL | Status: DC
  Administered 2019-08-31: 1 mg via ORAL
  Filled 2019-08-30 (×2): qty 1

## 2019-08-30 MED ORDER — FUROSEMIDE 10 MG/ML IJ SOLN
60.0000 mg | Freq: Two times a day (BID) | INTRAMUSCULAR | Status: DC
  Administered 2019-08-30: 60 mg via INTRAVENOUS
  Filled 2019-08-30: qty 6

## 2019-08-30 MED ORDER — AMLODIPINE BESYLATE 10 MG PO TABS
10.0000 mg | ORAL_TABLET | Freq: Every day | ORAL | Status: DC
  Administered 2019-08-30 – 2019-08-31 (×2): 10 mg via ORAL
  Filled 2019-08-30 (×2): qty 1

## 2019-08-30 MED ORDER — PNEUMOCOCCAL VAC POLYVALENT 25 MCG/0.5ML IJ INJ
0.5000 mL | INJECTION | INTRAMUSCULAR | Status: AC
  Administered 2019-08-31: 0.5 mL via INTRAMUSCULAR
  Filled 2019-08-30: qty 0.5

## 2019-08-30 MED ORDER — INFLUENZA VAC SPLIT QUAD 0.5 ML IM SUSY
0.5000 mL | PREFILLED_SYRINGE | INTRAMUSCULAR | Status: AC
  Administered 2019-08-31: 0.5 mL via INTRAMUSCULAR
  Filled 2019-08-30: qty 0.5

## 2019-08-30 MED ORDER — INSULIN ASPART PROT & ASPART (70-30 MIX) 100 UNIT/ML ~~LOC~~ SUSP
18.0000 [IU] | Freq: Two times a day (BID) | SUBCUTANEOUS | Status: DC
  Administered 2019-08-30 – 2019-08-31 (×3): 18 [IU] via SUBCUTANEOUS
  Filled 2019-08-30 (×2): qty 10

## 2019-08-30 MED ORDER — MONTELUKAST SODIUM 10 MG PO TABS
10.0000 mg | ORAL_TABLET | Freq: Every day | ORAL | Status: DC
  Administered 2019-08-30: 10 mg via ORAL
  Filled 2019-08-30: qty 1

## 2019-08-30 NOTE — Telephone Encounter (Signed)
-----   Message from Rigoberto Noel, MD sent at 08/30/2019  1:21 PM EST ----- Regarding: RE: Tele visit Wert pt - I only saw for OSA ----- Message ----- From: Candee Furbish, MD Sent: 08/30/2019   1:11 PM EST To: Rigoberto Noel, MD, [facility] Triage Pool Subject: Tele visit                                     2 weeks with midlevel or Dr. [name] thanks [facility] f/u

## 2019-08-30 NOTE — Progress Notes (Signed)
Paged Dr. Verlon Au, asking why the patient is on the covid unit when she has test negative 3 times. The patient is questioning why she is on the covid unit.

## 2019-08-30 NOTE — Evaluation (Signed)
Occupational Therapy Evaluation Patient Details Name: Lori Bean MRN: [id number] DOB: 1971-02-07 Today's Date: 08/30/2019    History of Present Illness Lori Bean is a 48 y.o. female with medical history significant of past diastolic heart failure , morbid obesity BUN 50 COPD asthma tobacco abuse lymphedema history of tongue cancer CPAP with O2 at night.  She is admitted due to SOB and chest pain with recent Covid exposure and seen two weeks ago for acute bronchitis.   Clinical Impression   This 48 y/o female presents with the above. PTA pt independent with ADL, iADL and functional mobility. Pt performing functional mobility this session without AD at supervision - minguard assist level; currently at supervision-minguard assist level for ADL completion. Pt on RA during session with spO2 >/=87%. Initiated education of energy conservation techniques during completion of ADL/functional tasks. She will benefit from continued acute OT services and recommend follow up [facility] services after discharge to progress her towards her PLOF. Will follow.     Follow Up Recommendations  Home health OT;Supervision/Assistance - 24 hour(may progress to no follow up )    Equipment Recommendations  None recommended by OT    Recommendations for Other Services       Precautions / Restrictions Precautions Precautions: Fall Precaution Comments: reports 5 falls in 6 months due to imbalance Restrictions Weight Bearing Restrictions: No      Mobility Bed Mobility Overal bed mobility: Modified Independent                Transfers Overall transfer level: Needs assistance Equipment used: None Transfers: Sit to/from Stand Sit to Stand: Supervision         General transfer comment: for safety and balance, stood from EOB and toilet    Balance Overall balance assessment: Needs assistance   Sitting balance-Leahy Scale: Good     Standing balance support: No upper extremity  supported;During functional activity Standing balance-Leahy Scale: Fair Standing balance comment: pt intermittently using single UE support if available                            ADL either performed or assessed with clinical judgement   ADL Overall ADL's : Needs assistance/impaired Eating/Feeding: Modified independent;Sitting   Grooming: Supervision/safety;Standing;Wash/dry hands   Upper Body Bathing: Set up;Sitting   Lower Body Bathing: Min guard;Sit to/from stand   Upper Body Dressing : Set up;Sitting   Lower Body Dressing: Min guard;Sit to/from stand   Toilet Transfer: Supervision/safety;Min Chief Financial Officer;Ambulation   Toileting- Clothing Manipulation and Hygiene: Supervision/safety;Sit to/from stand;Sitting/lateral lean     Tub/Shower Transfer Details (indicate cue type and reason): discussed use of shower seat during bathing ADL and pt reports has been using PTA Functional mobility during ADLs: Min guard;Supervision/safety General ADL Comments: educated pt in energy conservation techniques related to ADL/functional tasks     Vision         Perception     Praxis      Pertinent Vitals/Pain Pain Assessment: 0-10 Pain Score: 8  Pain Location: low back (chronic) Pain Descriptors / Indicators: Discomfort;Sore Pain Intervention(s): Limited activity within patient's tolerance;Monitored during session;Repositioned     Hand Dominance Right   Extremity/Trunk Assessment Upper Extremity Assessment Upper Extremity Assessment: Overall WFL for tasks assessed   Lower Extremity Assessment Lower Extremity Assessment: Defer to PT evaluation       Communication Communication Communication: No difficulties   Cognition Arousal/Alertness: Awake/alert Behavior During Therapy: [facility] for tasks assessed/performed  Overall Cognitive Status: Within Functional Limits for tasks assessed                                     General Comments  SpO2  >/=87% on RA    Exercises     Shoulder Instructions      Home Living Family/patient expects to be discharged to:: Private residence Living Arrangements: Spouse/significant other(boyfriend) Available Help at Discharge: Family Type of Home: House Home Access: Level entry     Home Layout: One level     Bathroom Shower/Tub: Occupational psychologist: Standard     Home Equipment: Clinical cytogeneticist - 2 wheels;Cane - single point          Prior Functioning/Environment Level of Independence: Independent        Comments: driving        OT Problem List: Decreased strength;Decreased activity tolerance;Impaired balance (sitting and/or standing);Cardiopulmonary status limiting activity;Obesity      OT Treatment/Interventions: Self-care/ADL training;Therapeutic exercise;Energy conservation;DME and/or AE instruction;Therapeutic activities;Patient/family education;Balance training    OT Goals(Current goals can be found in the care plan section) Acute Rehab OT Goals Patient Stated Goal: to go home OT Goal Formulation: With patient Time For Goal Achievement: 09/13/19 Potential to Achieve Goals: Good  OT Frequency: Min 2X/week   Barriers to D/C:            Co-evaluation              AM-PAC OT "6 Clicks" Daily Activity     Outcome Measure Help from another person eating meals?: None Help from another person taking care of personal grooming?: None Help from another person toileting, which includes using toliet, bedpan, or urinal?: None Help from another person bathing (including washing, rinsing, drying)?: A Little Help from another person to put on and taking off regular upper body clothing?: None Help from another person to put on and taking off regular lower body clothing?: A Little 6 Click Score: 22   End of Session Nurse Communication: Mobility status  Activity Tolerance: Patient tolerated treatment well Patient left: in chair;with call bell/phone  within reach  OT Visit Diagnosis: Muscle weakness (generalized) (M62.81);Other (comment)(decreased activity tolerance)                Time: [id number] OT Time Calculation (min): 15 min Charges:  OT General Charges $OT Visit: 1 Visit OT Evaluation $OT Eval Moderate Complexity: 1 Mod  Lou Cal, OT [employer] Pager [phone] Office [phone]   Raymondo Band 08/30/2019, 4:41 PM

## 2019-08-30 NOTE — Progress Notes (Signed)
Hospitalist daily note   Lori Bean [id number] DOB: 01-14-1971 DOA: 08/28/2019  PCP: [name], PA   Narrative:  68 blk Fem asthma/OSA, prior smoker, HTN, HFpEF EF 60-65% 04/14/2018 PASP 22-chronic back pain status post lumbar fusion 12/28/2013, venous thrombosis superficial 2013 schizophrenia Admitted 12/16 with 2 weeks thick productive sputum Rx bronchitis Z-Pak exposed to son who tested positive for Covid Rapid Covid test negative PCR test negative-repeat test 12:16 AM also negative  Data Reviewed:  BUN/creatinine 13/0.77 A1c 8.7 White count 13.1 hemoglobin 12.2 TSH 0.33 Baseline weight 05/01/2019 was 122kg on 02/14/2019 then went to 124 kg 05/01/2019 finally was 125.6 kg at cardiology office visit on admission 125.8   Assessment & Plan: Coronavirus negative x3 No further precautions transition to regular telemetry unit--biomarkers are not indicative acute inflammatory process arguing further against this however she will need to self isolate from her family if they are positive and figure this out in the outpatient setting Acute hypoxic respiratory failure Combination of probable acute bronchitis + heart failure Rx Solu-Medrol every 12/14 given prednisone 40 continue for another 4 days Home dose Lasix 80 twice daily changed to 60 IV twice daily 12/16 Continue azithromycin until 12/19 HTN HFpEF Continue bisoprolol 5 twice daily, resume amlodipine 10-for now hold Maxide daily, Diovan 80 daily OSA Has hypertension for the same resume 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 Needs to stay for learning about disease process Bipolar Continue Abilify 20 at bedtime, Cymbalta 30 twice daily  Lovenox, inpatient pending resolution none ability to use insulin and teaching of the same with disease process over the next 24  hours Transfer to noncoronavirus unit Expect discharge 12/17   Subjective: Awake alert in nad no distress feels "much better" Gained ~ 20 lb since august-compliant on lasix No wheeze now--but was bad when she came in--she uses Cpapa nightly per her Consultants:    none Procedures:   no Antimicrobials:   Azithromycin   Objective: Vitals:   08/30/19 0130 08/30/19 0344 08/30/19 0400 08/30/19 0800  BP: (!) 152/93  (!) 155/88 (!) 152/112  Pulse: 68  63 69  Resp: 18  17 (!) 28  Temp: 98.6 F (37 C) 98.2 F (36.8 C) 97.6 F (36.4 C) 97.8 F (36.6 C)  TempSrc: Oral Oral Axillary Oral  SpO2: 100%  100% 91%  Weight:   125.8 kg    No intake or output data in the 24 hours ending 08/30/19 0936 Filed Weights   08/30/19 0400  Weight: 125.8 kg    Examination: Awake obese pleasant in no distress Thick neck Mallampati 4 no wheeze no rales S1-S2 no murmur Abdomen obese nontender no guarding Neuro intact without deficit Scheduled Meds: . amitriptyline  25 mg Oral QHS  . ARIPiprazole  20 mg Oral QHS  . azithromycin  500 mg Oral Daily  . bisoprolol  5 mg Oral BID  . DULoxetine  30 mg Oral BID  . enoxaparin (LOVENOX) injection  40 mg Subcutaneous Q24H  . furosemide  80 mg Oral BID  . gabapentin  300 mg Oral TID  . [START ON 08/31/2019] influenza vac split quadrivalent PF  0.5 mL Intramuscular Tomorrow-1000  . insulin aspart  0-15 Units Subcutaneous Q4H  . insulin glargine  25 Units Subcutaneous Daily  . ipratropium  2 puff Inhalation Q6H  . living well with diabetes book  Does not apply Once  . mometasone-formoterol  1 puff Inhalation BID  . nicotine  21 mg Transdermal Daily  . pantoprazole  40 mg Oral Daily  . [START ON 08/31/2019] pneumococcal 23 valent vaccine  0.5 mL Intramuscular Tomorrow-1000  . predniSONE  40 mg Oral Q breakfast  . sodium chloride flush  3 mL Intravenous Q12H  . sodium chloride flush  3 mL Intravenous Q12H   Continuous Infusions: . sodium  chloride       LOS: 1 day   Time spent: [place], MD [facility]

## 2019-08-30 NOTE — Progress Notes (Signed)
Pt reports 20 lbs weight gain over the last few weeks.  Pt states she is more swollen than normal.  Pt was given 80mg  lasix.

## 2019-08-30 NOTE — Telephone Encounter (Signed)
Patient is still admitted at time of message. Will hold in triage until she has been discharged.

## 2019-08-30 NOTE — Progress Notes (Signed)
QIRAT RAUTH [id number] Admission Data: 08/30/2019 1:31 AM Attending Provider: Jonnie Finner, DO  [id number], Maud Deed, PA Consults/ Treatment Team: Treatment Team:  Pccm, Md, MD  Lori Bean is a 48 y.o. female patient admitted from ED awake, alert  & orientated  X 3,  Full Code, VSS - Blood pressure (!) 139/59, pulse 69, temperature 97.9 F (36.6 C), temperature source Oral, resp. rate 20, last menstrual period 10/15/2018, SpO2 100 %., O2   6 L nasal cannular, no c/o shortness of breath, no c/o chest pain, no distress noted. Tele # [id number] placed.   IV site WDL:  antecubital left, condition patent and no redness with a transparent dsg that's clean dry and intact.  Allergies:   Allergies  Allergen Reactions  . Aspirin Nausea And Vomiting     Past Medical History:  Diagnosis Date  . Abdominal hernia   . Anginal pain ([place])    occ from asthma  . Anxiety   . Arthritis   . Asthma   . Bipolar affective disorder ([place])    Schizophrenia  . Bronchitis    hx of  . CHF (congestive heart failure) ([place])   . Depression   . GERD (gastroesophageal reflux disease)   . Heart murmur   . Hypertension    takes meds  . Lymphedema 06/07/2018  . Morbid (severe) obesity due to excess calories ([place])   . Neuropathy    left leg , "from back surgery"  . Overactive bladder   . Schizophrenia ([place])   . Sciatica   . Shortness of breath   . Sleep apnea   . Snoring disorder    Pt stated my boyfriend always wakes me up and tells me to breathe   Pt orientation to unit, room and routine. Information packet given to patient/family and safety video watched.  Admission INP armband ID verified with patient/family, and in place. SR up x 2, fall risk assessment complete with Patient and family verbalizing understanding of risks associated with falls. Pt verbalizes an understanding of how to use the call bell and to call for help before getting out of bed.  Skin, clean-dry- intact without  evidence of bruising, or skin tears.   No evidence of skin break down noted on exam. no rashes, no ecchymoses, no wounds  Will cont to monitor and assist as needed.  Walker Shadow, RN 08/30/2019 1:31 AM

## 2019-08-30 NOTE — Progress Notes (Signed)
Pulmonary Progress Note  S:  Seen in f/u for multifactorial hypoxemia.  Much improved with current therapies.  On RA, wants to go home. Denies pain.  O: Today's Vitals   08/30/19 0130 08/30/19 0344 08/30/19 0400 08/30/19 0800  BP: (!) 152/93  (!) 155/88 (!) 152/112  Pulse: 68  63 69  Resp: 18  17 (!) 28  Temp: 98.6 F (37 C) 98.2 F (36.8 C) 97.6 F (36.4 C) 97.8 F (36.6 C)  TempSrc: Oral Oral Axillary Oral  SpO2: 100%  100% 91%  Weight:   125.8 kg   PainSc:   Asleep 8    Body mass index is 50.73 kg/m.  Middle aged woman in NAD MMM, malampatti 4 Lungs clear without wheezing Ext warm, no edema Moves all 4 ext to command, ambulatory  BNP normal Pct neg  A: # Asthma exacerbation improved # HFPEF, looks euvolemic to me # OSA # New DM  P - Continue dulera, atrovent, singulair - Prednisone x 4 days reasonable - Okay for home from my standpoint, will arrange televisit in 2 weeks  Erskine Emery MD PCCM

## 2019-08-30 NOTE — Progress Notes (Signed)
Report given Waverly Ferrari.

## 2019-08-30 NOTE — Progress Notes (Signed)
Physical Therapy Treatment Patient Details Name: Lori Bean MRN: [id number] DOB: April 23, 1971 Today's Date: 08/30/2019    History of Present Illness Lori Bean is a 48 y.o. female with medical history significant of past diastolic heart failure , morbid obesity BUN 50 COPD asthma tobacco abuse lymphedema history of tongue cancer CPAP with O2 at night.  She is admitted due to SOB and chest pain with recent Covid exposure and seen two weeks ago for acute bronchitis.    PT Comments    Pt up in chair on entry, agreeable to ambulation with therapy. Pt reports she does not need to use RW. Pt is supervision for transfers and hands on min guard for ambulation of 60 feet. Pt with oxygen desaturation on RA to 85%O2 at end of ambulation, however able to quickly rebound with cues for pursed lipped breathing.  Pt educated on need for short bouts of exercise/ambulation every hour to improve endurance. D/c plans remain appropriate at this time. PT will continue to follow acutely.   Follow Up Recommendations  Home health PT;Supervision for mobility/OOB     Equipment Recommendations  3in1 (PT)       Precautions / Restrictions Precautions Precautions: Fall Precaution Comments: reports 5 falls in 6 months due to imbalance Restrictions Weight Bearing Restrictions: No    Mobility  Bed Mobility Overal bed mobility: Modified Independent                Transfers Overall transfer level: Needs assistance Equipment used: None Transfers: Sit to/from Stand Sit to Stand: Supervision         General transfer comment: for safety and balance, stood from EOB and toilet  Ambulation/Gait Ambulation/Gait assistance: Min guard Gait Distance (Feet): 60 Feet Assistive device: None Gait Pattern/deviations: Step-through pattern;Decreased step length - right;Decreased step length - left;Shuffle Gait velocity: slowed Gait velocity interpretation: <1.8 ft/sec, indicate of risk for  recurrent falls General Gait Details: hands on min guard for safety, with slightly unsteady gait, no overt LoB, vc for slow, deep breathing,        Balance Overall balance assessment: Needs assistance   Sitting balance-Leahy Scale: Good     Standing balance support: No upper extremity supported;During functional activity Standing balance-Leahy Scale: Fair Standing balance comment: pt intermittently using single UE support if available                             Cognition Arousal/Alertness: Awake/alert Behavior During Therapy: WFL for tasks assessed/performed Overall Cognitive Status: Within Functional Limits for tasks assessed                                           General Comments General comments (skin integrity, edema, etc.): SaO2 on RA dropped to 85%O2 with ambulation however able to recover within minute with pursed lipped breathing      Pertinent Vitals/Pain Pain Assessment: 0-10 Pain Score: 8  Pain Location: low back (chronic) Pain Descriptors / Indicators: Discomfort;Sore Pain Intervention(s): Limited activity within patient's tolerance;Monitored during session;Repositioned    Home Living Family/patient expects to be discharged to:: Private residence Living Arrangements: Spouse/significant other(boyfriend) Available Help at Discharge: Family Type of Home: House Home Access: Level entry   Home Layout: One level Home Equipment: Clinical cytogeneticist - 2 wheels;Cane - single point      Prior Function Level of Independence:  Independent      Comments: driving   PT Goals (current goals can now be found in the care plan section) Acute Rehab PT Goals Patient Stated Goal: to go home PT Goal Formulation: With patient Time For Goal Achievement: 09/12/19 Potential to Achieve Goals: Good Progress towards PT goals: Progressing toward goals    Frequency    Min 3X/week      PT Plan Current plan remains appropriate        AM-PAC PT "6 Clicks" Mobility   Outcome Measure  Help needed turning from your back to your side while in a flat bed without using bedrails?: None Help needed moving from lying on your back to sitting on the side of a flat bed without using bedrails?: None Help needed moving to and from a bed to a chair (including a wheelchair)?: None Help needed standing up from a chair using your arms (e.g., wheelchair or bedside chair)?: None Help needed to walk in hospital room?: A Little Help needed climbing 3-5 steps with a railing? : A Little 6 Click Score: 22    End of Session Equipment Utilized During Treatment: Gait belt Activity Tolerance: Patient tolerated treatment well Patient left: with call bell/phone within reach;in chair Nurse Communication: Mobility status PT Visit Diagnosis: Muscle weakness (generalized) (M62.81);Difficulty in walking, not elsewhere classified (R26.2)     Time: [id number] PT Time Calculation (min) (ACUTE ONLY): 14 min  Charges:  $Gait Training: 8-22 mins                     Lori Bean B. Migdalia Dk PT, DPT Acute Rehabilitation Services Pager [phone] Office [phone]    [place] 08/30/2019, 5:38 PM

## 2019-08-31 LAB — GLUCOSE, CAPILLARY
Glucose-Capillary: 121 mg/dL — ABNORMAL HIGH (ref 70–99)
Glucose-Capillary: 219 mg/dL — ABNORMAL HIGH (ref 70–99)
Glucose-Capillary: 111 mg/dL — ABNORMAL HIGH (ref 70–99)

## 2019-08-31 LAB — RENAL FUNCTION PANEL
Albumin: 3 g/dL — ABNORMAL LOW (ref 3.5–5.0)
Anion gap: 10 (ref 5–15)
BUN: 20 mg/dL (ref 6–20)
CO2: 33 mmol/L — ABNORMAL HIGH (ref 22–32)
Calcium: 8.6 mg/dL — ABNORMAL LOW (ref 8.9–10.3)
Chloride: 96 mmol/L — ABNORMAL LOW (ref 98–111)
Creatinine, Ser: 1.01 mg/dL — ABNORMAL HIGH (ref 0.44–1.00)
GFR calc Af Amer: >60 mL/min (ref >60)
GFR calc non Af Amer: >60 mL/min (ref >60)
Glucose, Bld: 124 mg/dL — ABNORMAL HIGH (ref 70–99)
Phosphorus: 4.6 mg/dL (ref 2.5–4.6)
Potassium: 4 mmol/L (ref 3.5–5.1)
Sodium: 139 mmol/L (ref 135–145)

## 2019-08-31 MED ORDER — INSULIN ASPART PROT & ASPART (70-30 MIX) 100 UNIT/ML PEN: 18.0000 [IU] | PEN_INJECTOR | Freq: Two times a day (BID) | SUBCUTANEOUS | 11 refills | Status: DC

## 2019-08-31 MED ORDER — AMLODIPINE BESYLATE 10 MG PO TABS: 10.0000 mg | ORAL_TABLET | Freq: Every day | ORAL | 2 refills | Status: DC

## 2019-08-31 MED ORDER — PREDNISONE 20 MG PO TABS: 40.0000 mg | ORAL_TABLET | Freq: Every day | ORAL | 0 refills | Status: DC

## 2019-08-31 MED ORDER — GLIMEPIRIDE 1 MG PO TABS: 1.0000 mg | ORAL_TABLET | Freq: Every day | ORAL | 3 refills | Status: DC

## 2019-08-31 MED ORDER — BLOOD GLUCOSE MONITOR KIT: PACK | 0 refills | Status: DC

## 2019-08-31 MED ORDER — NICOTINE 21 MG/24HR TD PT24: 21.0000 mg | MEDICATED_PATCH | Freq: Every day | TRANSDERMAL | 0 refills | Status: DC

## 2019-08-31 MED ORDER — INSULIN PEN NEEDLE 30G X 8 MM MISC: 1.0000 | 3 refills | Status: DC | PRN

## 2019-08-31 MED ORDER — AZITHROMYCIN 500 MG PO TABS: 500.0000 mg | ORAL_TABLET | Freq: Every day | ORAL | 0 refills | Status: DC

## 2019-08-31 MED ORDER — IPRATROPIUM BROMIDE HFA 17 MCG/ACT IN AERS: 2.0000 | INHALATION_SPRAY | Freq: Four times a day (QID) | RESPIRATORY_TRACT | 12 refills | Status: DC

## 2019-08-31 NOTE — TOC Transition Note (Signed)
Transition of Care [facility]) - CM/SW Discharge Note   Patient Details  Name: ORETHA ABBY MRN: [id number] Date of Birth: 27-Sep-1970  [facility] CM/SW Contact:  Rae Mar, RN Phone Number: 08/31/2019, 10:37 AM   Clinical Narrative:     CM consulted for [facility] and DME.  CM noted pt has Medicaid which no HH agency will accept except on a rare occasion.  CM placed ambulatory referrals for outpt PT/OT at [facility], the closest location to pt's home.  CM also contacted Chauncey with [employer] for DME 3-n-1 after confirmed with pt's nurse that she would like the DME.  CM updated Dr. Verlon Au on PT/OT referral change.  Information placed on AVS and reprinted.  No further TOC needs noted at this time.  Final next level of care: OP Rehab Barriers to Discharge: Barriers Resolved   Patient Goals and CMS Choice        Discharge Placement                       Discharge Plan and Services   Discharge Planning Services: CM Consult            DME Arranged: 3-N-1 DME Agency: [employer] Date DME Agency Contacted: 08/31/19 Time DME Agency Contacted: [id number] Representative spoke with at DME Agency: [place] ([place]) Interventions     Readmission Risk Interventions No flowsheet data found.

## 2019-08-31 NOTE — Telephone Encounter (Signed)
Called and spoke with pt to schedule hospital follow up appt with MW. appt has been scheduled for her 09/18/2019 at 12pm. Nothing further needed.

## 2019-08-31 NOTE — Progress Notes (Signed)
DISCHARGE NOTE HOME Lori Bean to be discharged Home per MD order. Discussed prescriptions and follow up appointments with the patient. Prescriptions given to patient; medication list explained in detail. Patient verbalized understanding.  Skin clean, dry and intact without evidence of skin break down, no evidence of skin tears noted. IV catheter discontinued intact. Site without signs and symptoms of complications. Dressing and pressure applied. Pt denies pain at the site currently. No complaints noted.  Patient free of lines, drains, and wounds.   An After Visit Summary (AVS) was printed and given to the patient. Patient escorted via wheelchair, and discharged home via private auto.  Arlyss Repress, RN

## 2019-08-31 NOTE — Discharge Summary (Signed)
Physician Discharge Summary  Lori Bean AOZ:[id number] DOB: [birth year] DOA: 08/28/2019  PCP: [name], PA  Admit date: 08/28/2019 Discharge date: 08/31/2019  Time spent: 22 minutes  Recommendations for Outpatient Follow-up:  1. Complete azithromycin prednisone as an outpatient 2. New start to 70/30 insulin twice a day and Amaryl-patient cautioned on checking blood sugars at least twice a day with instructions given-supplies pen and pen needles given 3. Patient discharged with 3 in 1 commode on discharge 4. Some medications have changed please note changes including discontinuation ARB and Maxide  Discharge Diagnoses:  Active Problems:   Essential hypertension   Bipolar affective disorder (HCC)   GERD (gastroesophageal reflux disease)   Chronic diastolic heart failure (HCC)   OSA (obstructive sleep apnea)   Lymphedema   COPD with acute exacerbation ([place])   COPD exacerbation ([place])   Discharge Condition: Improved  Diet recommendation: Heart healthy  Filed Weights   08/30/19 0400 08/30/19 1800 08/30/19 2100  Weight: 125.8 kg 125.8 kg 125.7 kg    History of present illness:  64 blk Fem asthma/OSA, prior smoker, HTN, HFpEF EF 60-65% 04/14/2018 PASP 22-chronic back pain status post lumbar fusion 12/28/2013, venous thrombosis superficial 2013 schizophrenia Admitted 12/16 with 2 weeks thick productive sputum Rx bronchitis Z-Pak exposed to son who tested positive for Covid Rapid Covid test negative PCR test negative-repeat test 12:16 AM also negative  Hospital Course:  Baseline weight 05/01/2019 was 122kg on 02/14/2019 then went to 124 kg 05/01/2019 finally was 125.6 kg at cardiology office visit on admission 125.8   Assessment & Plan: Coronavirus negative x3 No further precautions transition to regular telemetry unit--biomarkers are not indicative acute inflammatory process arguing further against this however she will need to self isolate from her family if they  are positive and figure this out in the outpatient setting Acute hypoxic respiratory failure Combination of probable acute bronchitis + heart failure Rx Solu-Medrol every 12/14 given prednisone 40 to finish off a burst Home dose Lasix 80 twice daily changed to 60 IV twice daily 12/16-was changed to home dose 80 mg twice a day on discharge should take an extra 80 mg if he gains more than 2 kg in 24 hours Continue azithromycin until 12/19 HTN HFpEF Continue bisoprolol 5 twice daily, resume amlodipine 10-this admission discontinued Maxide daily, Diovan 80 daily and this may need to be reimplemented OSA Resume during hospital stay 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 Stable and understands how to care for self medications-meter ordered in addition to supplies Bipolar Continue Abilify 20 at bedtime, Cymbalta 30 twice daily  Procedures:  Multiple  Consultations:  Pulmonary  Discharge Exam: Vitals:   08/31/19 0055 08/31/19 0429  BP:  130/73  Pulse:  66  Resp:  16  Temp:  98.1 F (36.7 C)  SpO2: 94% 93%    General: Awake alert no distress no new issues Cardiovascular: S1-S2 no murmur rub or gallop Respiratory: Clinically clear no wheezing Abdomen soft nontender no rebound no guarding  Discharge Instructions   Discharge Instructions    Diet - low sodium heart healthy   Complete by: As directed    Discharge instructions   Complete by: As directed    Make sure that you complete your azithromycin and prednisone prescription as you will need both to get over your hump of possible worsening COPD  Continue your Lasix at the usual doses do not drink more than 2.5 L in a day and if you gain more than 2 kg in 24 hours take an extra dose of Lasix We are prescribing for you a glucometer and a FlexPen with  supplies please check your sugars at least twice a day 2 hours after lunch and 1 hour before dinner alternating with 1 hour before lunch and 2 hours after dinner and get a log-as you come off your steroids your glucose levels will drop and you may not need as much as 18 units twice a day in fact you can probably go down to 12 or 10 units depending on what your blood sugars are You will also be prescribed a medication called Amaryl which should be taken early in the morning which is to help you with your blood sugar control and if you are finding that you morning sugars are low this can be discontinued but talk to your primary care physician Please follow-up with respiratory Dr. [name] in the outpatient and they have set up an appointment for you and will call you   For home use only DME 3 n 1   Complete by: As directed    Increase activity slowly   Complete by: As directed      Allergies as of 08/31/2019      Reactions   Aspirin Nausea And Vomiting      Medication List    STOP taking these medications   hydrOXYzine 25 MG capsule Commonly known as: VISTARIL   triamterene-hydrochlorothiazide 37.5-25 MG tablet Commonly known as: MAXZIDE-25   valsartan 80 MG tablet Commonly known as: DIOVAN     TAKE these medications   amLODipine 10 MG tablet Commonly known as: NORVASC Take 1 tablet (10 mg total) by mouth daily.   ARIPiprazole 20 MG tablet Commonly known as: ABILIFY Take 1 tablet (20 mg total) by mouth at bedtime. What changed: when to take this   azithromycin 500 MG tablet Commonly known as: ZITHROMAX Take 1 tablet (500 mg total) by mouth daily.   bisoprolol 5 MG tablet Commonly known as: ZEBETA One twice daily What changed:   how much to take  how to take this  when to take this  additional instructions   blood glucose meter kit and supplies Kit Dispense based on patient and insurance preference. Use up to four times daily as directed. (FOR ICD-9 250.00, 250.01).    budesonide-formoterol 160-4.5 MCG/ACT inhaler Commonly known as: Symbicort Inhale 2 puffs into the lungs 2 (two) times daily.   diclofenac Sodium 1 % Gel Commonly known as: VOLTAREN Apply 1 application topically 2 (two) times daily. For knee and back pain   DULoxetine 30 MG capsule Commonly known as: CYMBALTA Take 30 mg by mouth 2 (two) times daily.   Flutter Devi Use flutter device 3 times a day   furosemide 80 MG tablet Commonly known as: LASIX Take 1 tablet (80 mg total) by mouth 2 (two) times daily.   gabapentin 300 MG capsule Commonly known as: NEURONTIN Take 300 mg by mouth 3 (three) times daily.   glimepiride 1 MG tablet Commonly known as: AMARYL Take 1 tablet (1 mg total) by mouth daily with breakfast.   HYDROcodone-acetaminophen 5-325 MG tablet Commonly known as: NORCO/VICODIN Take 1-2 tablets by mouth See admin instructions. Take 2 tablets by mouth daily at bedtime, may also take 1-2 tablets every 4 hours as needed for pain   insulin aspart protamine -  aspart (70-30) 100 UNIT/ML FlexPen Commonly known as: NOVOLOG 70/30 MIX Inject 0.18 mLs (18 Units total) into the skin 2 (two) times daily with a meal.   Insulin Pen Needle 30G X 8 MM Misc Commonly known as: NOVOFINE Inject 10 each into the skin as needed.   ipratropium 17 MCG/ACT inhaler Commonly known as: ATROVENT HFA Inhale 2 puffs into the lungs every 6 (six) hours.   meloxicam 15 MG tablet Commonly known as: MOBIC Take 1 tablet (15 mg total) by mouth daily as needed for pain. What changed: when to take this   methocarbamol 500 MG tablet Commonly known as: ROBAXIN Take 500 mg by mouth 2 (two) times daily.   montelukast 10 MG tablet Commonly known as: SINGULAIR Take 1 tablet (10 mg total) by mouth at bedtime.   nicotine 21 mg/24hr patch Commonly known as: NICODERM CQ - dosed in mg/24 hours Place 1 patch (21 mg total) onto the skin daily.   omeprazole 40 MG capsule Commonly known as:  PRILOSEC Take 1 capsule (40 mg total) by mouth daily.   potassium chloride SA 20 MEQ tablet Commonly known as: KLOR-CON Take 20 mEq by mouth 3 (three) times a week.   predniSONE 20 MG tablet Commonly known as: DELTASONE Take 2 tablets (40 mg total) by mouth daily with breakfast.   PRESCRIPTION MEDICATION Inhale into the lungs at bedtime. CPAP   ProAir HFA 108 (90 Base) MCG/ACT inhaler Generic drug: albuterol Inhale 2 puffs into the lungs every 4 (four) hours as needed for wheezing or shortness of breath.   albuterol (2.5 MG/3ML) 0.083% nebulizer solution Commonly known as: PROVENTIL Take 2.5 mg by nebulization every 4 (four) hours as needed for wheezing or shortness of breath.   Triamcinolone Acetonide 0.025 % Lotn Apply 1 application topically 3 (three) times daily.            Durable Medical Equipment  (From admission, onward)         Start     Ordered   08/31/19 0000  For home use only DME 3 n 1     08/31/19 4665         Allergies  Allergen Reactions  . Aspirin Nausea And Vomiting      The results of significant diagnostics from this hospitalization (including imaging, microbiology, ancillary and laboratory) are listed below for reference.    Significant Diagnostic Studies: CT Angio Chest PE W and/or Wo Contrast  Result Date: 08/28/2019 CLINICAL DATA:  Shortness of breath. Elevated D-dimer. Abdominal pain. EXAM: CT ANGIOGRAPHY CHEST CT ABDOMEN AND PELVIS WITH CONTRAST TECHNIQUE: Multidetector CT imaging of the chest was performed using the standard protocol during bolus administration of intravenous contrast. Multiplanar CT image reconstructions and MIPs were obtained to evaluate the vascular anatomy. Multidetector CT imaging of the abdomen and pelvis was performed using the standard protocol during bolus administration of intravenous contrast. CONTRAST:  179m OMNIPAQUE IOHEXOL 350 MG/ML SOLN COMPARISON:  Chest CTA dated 08/17/2015. Abdomen and pelvis CT  dated 04/08/2015. FINDINGS: CTA CHEST FINDINGS Cardiovascular: Satisfactory opacification of the pulmonary arteries to the segmental level. No evidence of pulmonary embolism. Normal heart size. No pericardial effusion. Mediastinum/Nodes: No enlarged mediastinal, hilar, or axillary lymph nodes. Thyroid gland, trachea, and esophagus demonstrate no significant findings. Lungs/Pleura: Bilateral lower lobe subsegmental atelectasis. No pleural fluid. Musculoskeletal: Thoracic spine degenerative changes, including changes of DISH. Review of the MIP images confirms the above findings. CT ABDOMEN and PELVIS FINDINGS Hepatobiliary: Marked diffuse low density of the liver  relative to the spleen. Cholecystectomy clips. Pancreas: Unremarkable. No pancreatic ductal dilatation or surrounding inflammatory changes. Spleen: Normal in size without focal abnormality. Adrenals/Urinary Tract: Stable left adrenal hyperplasia. Unremarkable right adrenal gland, kidneys, ureters and urinary bladder. Stomach/Bowel: Stomach is within normal limits. Appendix appears normal. No evidence of bowel wall thickening, distention, or inflammatory changes. Vascular/Lymphatic: No significant vascular findings are present. No enlarged abdominal or pelvic lymph nodes. Reproductive: Uterus and bilateral adnexa are unremarkable. Other: No abdominal wall hernia or abnormality. No abdominopelvic ascites. Musculoskeletal: Interbody and pedicle screw and rod fusion at the L5-S1 level. Lumbar spine degenerative changes. Review of the MIP images confirms the above findings. IMPRESSION: 1. No pulmonary emboli. 2. No acute abnormality in the chest, abdomen or pelvis. 3. Marked diffuse hepatic steatosis. Electronically Signed   By: Claudie Revering M.D.   On: 08/28/2019 16:16   CT ABDOMEN PELVIS W CONTRAST  Result Date: 08/28/2019 CLINICAL DATA:  Shortness of breath. Elevated D-dimer. Abdominal pain. EXAM: CT ANGIOGRAPHY CHEST CT ABDOMEN AND PELVIS WITH CONTRAST  TECHNIQUE: Multidetector CT imaging of the chest was performed using the standard protocol during bolus administration of intravenous contrast. Multiplanar CT image reconstructions and MIPs were obtained to evaluate the vascular anatomy. Multidetector CT imaging of the abdomen and pelvis was performed using the standard protocol during bolus administration of intravenous contrast. CONTRAST:  143m OMNIPAQUE IOHEXOL 350 MG/ML SOLN COMPARISON:  Chest CTA dated 08/17/2015. Abdomen and pelvis CT dated 04/08/2015. FINDINGS: CTA CHEST FINDINGS Cardiovascular: Satisfactory opacification of the pulmonary arteries to the segmental level. No evidence of pulmonary embolism. Normal heart size. No pericardial effusion. Mediastinum/Nodes: No enlarged mediastinal, hilar, or axillary lymph nodes. Thyroid gland, trachea, and esophagus demonstrate no significant findings. Lungs/Pleura: Bilateral lower lobe subsegmental atelectasis. No pleural fluid. Musculoskeletal: Thoracic spine degenerative changes, including changes of DISH. Review of the MIP images confirms the above findings. CT ABDOMEN and PELVIS FINDINGS Hepatobiliary: Marked diffuse low density of the liver relative to the spleen. Cholecystectomy clips. Pancreas: Unremarkable. No pancreatic ductal dilatation or surrounding inflammatory changes. Spleen: Normal in size without focal abnormality. Adrenals/Urinary Tract: Stable left adrenal hyperplasia. Unremarkable right adrenal gland, kidneys, ureters and urinary bladder. Stomach/Bowel: Stomach is within normal limits. Appendix appears normal. No evidence of bowel wall thickening, distention, or inflammatory changes. Vascular/Lymphatic: No significant vascular findings are present. No enlarged abdominal or pelvic lymph nodes. Reproductive: Uterus and bilateral adnexa are unremarkable. Other: No abdominal wall hernia or abnormality. No abdominopelvic ascites. Musculoskeletal: Interbody and pedicle screw and rod fusion at the  L5-S1 level. Lumbar spine degenerative changes. Review of the MIP images confirms the above findings. IMPRESSION: 1. No pulmonary emboli. 2. No acute abnormality in the chest, abdomen or pelvis. 3. Marked diffuse hepatic steatosis. Electronically Signed   By: SClaudie ReveringM.D.   On: 08/28/2019 16:16   DG Chest Portable 1 View  Result Date: 08/28/2019 CLINICAL DATA:  Shortness of breath and chest pain EXAM: PORTABLE CHEST 1 VIEW COMPARISON:  12/08/2018 FINDINGS: The heart is mildly enlarged but stable. The mediastinal and hilar contours are within normal limits. The lungs are clear. No pleural effusions or focal infiltrates. IMPRESSION: Mild stable cardiac enlargement but no acute pulmonary findings. Electronically Signed   By: PMarijo SanesM.D.   On: 08/28/2019 10:52    Microbiology: Recent Results (from the past 240 hour(s))  Blood Culture (routine x 2)     Status: None (Preliminary result)   Collection Time: 08/28/19 11:00 AM   Specimen: BLOOD  Result  Value Ref Range Status   Specimen Description BLOOD LEFT ANTECUBITAL  Final   Special Requests   Final    BOTTLES DRAWN AEROBIC AND ANAEROBIC Blood Culture adequate volume   Culture   Final    NO GROWTH 2 DAYS Performed at [facility], [address] [place], [place] [address]    Report Status PENDING  Incomplete  Respiratory Panel by RT PCR (Flu A&B, Covid) - Nasopharyngeal Swab     Status: None   Collection Time: 08/28/19  1:46 PM   Specimen: Nasopharyngeal Swab  Result Value Ref Range Status   SARS Coronavirus 2 by RT PCR NEGATIVE NEGATIVE Final    Comment: (NOTE) SARS-CoV-2 target nucleic acids are NOT DETECTED. The SARS-CoV-2 RNA is generally detectable in upper respiratoy specimens during the acute phase of infection. The lowest concentration of SARS-CoV-2 viral copies this assay can detect is 131 copies/mL. A negative result does not preclude SARS-Cov-2 infection and should not be used as the sole basis for treatment  or other patient management decisions. A negative result may occur with  improper specimen collection/handling, submission of specimen other than nasopharyngeal swab, presence of viral mutation(s) within the areas targeted by this assay, and inadequate number of viral copies (<131 copies/mL). A negative result must be combined with clinical observations, patient history, and epidemiological information. The expected result is Negative. Fact Sheet for Patients:  PinkCheek.be Fact Sheet for Healthcare Providers:  GravelBags.it This test is not yet ap proved or cleared by the [place] FDA and  has been authorized for detection and/or diagnosis of SARS-CoV-2 by FDA under an Emergency Use Authorization (EUA). This EUA will remain  in effect (meaning this test can be used) for the duration of the COVID-19 declaration under Section 564(b)(1) of the Act, 21 U.S.C. section 360bbb-3(b)(1), unless the authorization is terminated or revoked sooner.    Influenza A by PCR NEGATIVE NEGATIVE Final   Influenza B by PCR NEGATIVE NEGATIVE Final    Comment: (NOTE) The Xpert Xpress SARS-CoV-2/FLU/RSV assay is intended as an aid in  the diagnosis of influenza from Nasopharyngeal swab specimens and  should not be used as a sole basis for treatment. Nasal washings and  aspirates are unacceptable for Xpert Xpress SARS-CoV-2/FLU/RSV  testing. Fact Sheet for Patients: PinkCheek.be Fact Sheet for Healthcare Providers: GravelBags.it This test is not yet approved or cleared by the [place] FDA and  has been authorized for detection and/or diagnosis of SARS-CoV-2 by  FDA under an Emergency Use Authorization (EUA). This EUA will remain  in effect (meaning this test can be used) for the duration of the  Covid-19 declaration under Section 564(b)(1) of the Act, 21  U.S.C. section  360bbb-3(b)(1), unless the authorization is  terminated or revoked. Performed at [facility], [place] [address], [place], [place] [address]   Respiratory Panel by PCR     Status: None   Collection Time: 08/28/19  1:46 PM   Specimen: Nasopharyngeal Swab; Respiratory  Result Value Ref Range Status   Adenovirus NOT DETECTED NOT DETECTED Final   Coronavirus 229E NOT DETECTED NOT DETECTED Final    Comment: (NOTE) The Coronavirus on the Respiratory Panel, DOES NOT test for the novel  Coronavirus (2019 nCoV)    Coronavirus HKU1 NOT DETECTED NOT DETECTED Final   Coronavirus NL63 NOT DETECTED NOT DETECTED Final   Coronavirus OC43 NOT DETECTED NOT DETECTED Final   Metapneumovirus NOT DETECTED NOT DETECTED Final   Rhinovirus / Enterovirus NOT DETECTED  NOT DETECTED Final   Influenza A NOT DETECTED NOT DETECTED Final   Influenza B NOT DETECTED NOT DETECTED Final   Parainfluenza Virus 1 NOT DETECTED NOT DETECTED Final   Parainfluenza Virus 2 NOT DETECTED NOT DETECTED Final   Parainfluenza Virus 3 NOT DETECTED NOT DETECTED Final   Parainfluenza Virus 4 NOT DETECTED NOT DETECTED Final   Respiratory Syncytial Virus NOT DETECTED NOT DETECTED Final   Bordetella pertussis NOT DETECTED NOT DETECTED Final   Chlamydophila pneumoniae NOT DETECTED NOT DETECTED Final   Mycoplasma pneumoniae NOT DETECTED NOT DETECTED Final    Comment: Performed at [facility], [place] [address] [place], [place] [address]  Blood Culture (routine x 2)     Status: None (Preliminary result)   Collection Time: 08/28/19  2:18 PM   Specimen: BLOOD  Result Value Ref Range Status   Specimen Description BLOOD RIGHT ANTECUBITAL  Final   Special Requests   Final    BOTTLES DRAWN AEROBIC ONLY Blood Culture results may not be optimal due to an inadequate volume of blood received in culture bottles   Culture   Final    NO GROWTH 2 DAYS Performed at [facility], [place] [address], [place], [place] [address]    Report  Status PENDING  Incomplete  SARS CORONAVIRUS 2 (TAT 6-24 HRS) Nasopharyngeal Nasopharyngeal Swab     Status: None   Collection Time: 08/29/19  8:22 PM   Specimen: Nasopharyngeal Swab  Result Value Ref Range Status   SARS Coronavirus 2 NEGATIVE NEGATIVE Final    Comment: (NOTE) SARS-CoV-2 target nucleic acids are NOT DETECTED. The SARS-CoV-2 RNA is generally detectable in upper and lower respiratory specimens during the acute phase of infection. Negative results do not preclude SARS-CoV-2 infection, do not rule out co-infections with other pathogens, and should not be used as the sole basis for treatment or other patient management decisions. Negative results must be combined with clinical observations, patient history, and epidemiological information. The expected result is Negative. Fact Sheet for Patients: SugarRoll.be Fact Sheet for Healthcare Providers: https://www.woods-mathews.com/ This test is not yet approved or cleared by the [place] FDA and  has been authorized for detection and/or diagnosis of SARS-CoV-2 by FDA under an Emergency Use Authorization (EUA). This EUA will remain  in effect (meaning this test can be used) for the duration of the COVID-19 declaration under Section 56 4(b)(1) of the Act, 21 U.S.C. section 360bbb-3(b)(1), unless the authorization is terminated or revoked sooner. Performed at [facility], [place] [address] [place], [place] [address]      Labs: Basic Metabolic Panel: Recent Labs  Lab 08/28/19 1100 08/28/19 1957 08/28/19 2039 08/29/19 0338 08/30/19 0753 08/31/19 0338  NA 139  --  138 137 140 139  K 3.9  --  4.1 4.4 3.8 4.0  CL 104  --   --  104 101 96*  CO2 24  --   --  19* 29 33*  GLUCOSE 204*  --   --  297* 186* 124*  BUN 15  --   --  '13 18 20  '$ CREATININE 0.69  --   --  0.77 0.92 1.01*  CALCIUM 9.0  --   --  8.8* 8.7* 8.6*  MG  --  1.8  --  2.1 2.1  --   PHOS  --   --   --  2.3*   --  4.6   Liver Function Tests: Recent Labs  Lab 08/28/19 1100 08/29/19  4665 08/30/19 0753 08/31/19 0338  AST '17 22 16  '$ --   ALT 41 38 39  --   ALKPHOS 77 71 69  --   BILITOT 0.2* 0.2* 0.1*  --   PROT 6.5 6.1* 6.5  --   ALBUMIN 3.3* 3.1* 3.2* 3.0*   Recent Labs  Lab 08/28/19 1100  LIPASE 34   No results for input(s): AMMONIA in the last 168 hours. CBC: Recent Labs  Lab 08/28/19 1100 08/28/19 2039 08/29/19 0338 08/30/19 0753  WBC 10.1  --  13.3* 12.5*  NEUTROABS 6.4  --   --  8.2*  HGB 13.3 12.9 12.2 13.5  HCT 39.2 38.0 36.3 41.3  MCV 94.5  --  94.5 96.3  PLT 193  --  200 219   Cardiac Enzymes: No results for input(s): CKTOTAL, CKMB, CKMBINDEX, TROPONINI in the last 168 hours. BNP: BNP (last 3 results) Recent Labs    09/20/18 2016 12/08/18 1044 08/28/19 1100  BNP 32.9 11.9 26.9    ProBNP (last 3 results) Recent Labs    08/16/19 0929  PROBNP 38    CBG: Recent Labs  Lab 08/30/19 0807 08/30/19 1155 08/30/19 1630 08/30/19 2102 08/31/19 0429  GLUCAP 179* 246* 222* 153* 121*       Signed:  Nita Sells MD   [facility] 08/31/2019, 6:26 AM

## 2019-09-01 ENCOUNTER — Telehealth: Payer: Self-pay

## 2019-09-01 ENCOUNTER — Telehealth (INDEPENDENT_AMBULATORY_CARE_PROVIDER_SITE_OTHER): Payer: Medicaid Other | Admitting: Cardiology

## 2019-09-01 ENCOUNTER — Encounter: Payer: Self-pay | Admitting: Cardiology

## 2019-09-01 DIAGNOSIS — C029 Malignant neoplasm of tongue, unspecified: Secondary | ICD-10-CM

## 2019-09-01 DIAGNOSIS — J9620 Acute and chronic respiratory failure, unspecified whether with hypoxia or hypercapnia: Secondary | ICD-10-CM

## 2019-09-01 DIAGNOSIS — I110 Hypertensive heart disease with heart failure: Secondary | ICD-10-CM | POA: Diagnosis not present

## 2019-09-01 DIAGNOSIS — I5032 Chronic diastolic (congestive) heart failure: Secondary | ICD-10-CM | POA: Diagnosis not present

## 2019-09-01 DIAGNOSIS — Z6841 Body Mass Index (BMI) 40.0 and over, adult: Secondary | ICD-10-CM

## 2019-09-01 DIAGNOSIS — G4733 Obstructive sleep apnea (adult) (pediatric): Secondary | ICD-10-CM

## 2019-09-01 DIAGNOSIS — J441 Chronic obstructive pulmonary disease with (acute) exacerbation: Secondary | ICD-10-CM

## 2019-09-01 DIAGNOSIS — I890 Lymphedema, not elsewhere classified: Secondary | ICD-10-CM

## 2019-09-01 MED ORDER — VALSARTAN 160 MG PO TABS: 160.0000 mg | ORAL_TABLET | Freq: Every day | ORAL | 0 refills | Status: DC

## 2019-09-01 NOTE — Assessment & Plan Note (Signed)
Discharged 08/31/2019 on Lasix 80 mg BID-off maxzide

## 2019-09-01 NOTE — Assessment & Plan Note (Signed)
Noted on biopsy Feb 2020- followed by Dr Buelah Manis at [facility]. She will need further treatment on hold secondary to [place] pandemic

## 2019-09-01 NOTE — Patient Instructions (Addendum)
Medication Instructions:  START Valsartan 160mg  Take 1 tablet daily for twenty-one days then STOP *If you need a refill on your cardiac medications before your next appointment, please call your pharmacy*  Lab Work: Your physician recommends that you return for lab work in: [place] APPT-BMET If you have labs (blood work) drawn today and your tests are completely normal, you will receive your results only by: [name] MyChart Message (if you have MyChart) OR . A paper copy in the mail If you have any lab test that is abnormal or we need to change your treatment, we will call you to review the results.  Testing/Procedures: None   Follow-Up: At [facility], you and your health needs are our priority.  As part of our continuing mission to provide you with exceptional heart care, we have created designated Provider Care Teams.  These Care Teams include your primary Cardiologist (physician) and Advanced Practice Providers (APPs -  Physician Assistants and Nurse Practitioners) who all work together to provide you with the care you need, when you need it.  Your next appointment:   10-14 day(s)  The format for your next appointment:   Virtual Visit   Provider:   Kerin Ransom, PA-C  Other Instructions

## 2019-09-01 NOTE — Assessment & Plan Note (Signed)
BMI 50  

## 2019-09-01 NOTE — Assessment & Plan Note (Signed)
On C-pap 

## 2019-09-01 NOTE — Telephone Encounter (Signed)
Contacted patient to discuss AVS Instructions. Gave patient Luke's recommendations from today's virtual office visit. Scheduled her follow up appt. Patient voiced understanding and AVS mailed.

## 2019-09-01 NOTE — Progress Notes (Signed)
Virtual Visit via Telephone Note   This visit type was conducted due to national recommendations for restrictions regarding the COVID-19 Pandemic (e.g. social distancing) in an effort to limit this patient's exposure and mitigate transmission in our community.  Due to her co-morbid illnesses, this patient is at least at moderate risk for complications without adequate follow up.  This format is felt to be most appropriate for this patient at this time.  The patient did not have access to video technology/had technical difficulties with video requiring transitioning to audio format only (telephone).  All issues noted in this document were discussed and addressed.  No physical exam could be performed with this format.  Please refer to the patient's chart for her  consent to telehealth for [facility].   Date:  09/01/2019   ID:  Lori Bean, DOB 01/30/1971, MRN [id number]  Patient Location: Home Provider Location: Office  PCP:  [name], PA  Cardiologist:  [name], MD  Electrophysiologist:  None   Evaluation Performed:  Follow-Up Visit  Chief Complaint:  none  History of Present Illness:    Lori Bean is a 48 y.o. female with a hx of past diastolic heart failure, though her most recent echo from August 2019 showed an EF of 60 to 65% with mild LVH and no significant diastolic dysfunction.  She has super morbid obesity with a BMI of 50.  She has sleep apnea and is on CPAP.  She has asthmatic COPD and is a smoker.  Dr. [name] follows her.  She has chronic lymphedema and is on high-dose diuretics.  In February 2020 she was diagnosed with tongue cancer, she is followed by Dr. Tamela Oddi at [facility].  I saw her in the office 08/16/2019 with complaints of increasing shortness of breath and a productive cough with thick sputum.  She denied orthopnea.  Her weight was similar to her August 2020 weight although in reviewing this she had gradually increased about  30 pounds since December 2019.  She denied any fever or chills.  Her oxygen level was 97% on room air.  At that time I felt she most likely had acute bronchitis and I prescribed a Z-Pak.  Initially she had some improvement but her symptoms recurred and she ended up in the emergency room 08/28/2019.  There was a high suspicion that she had COVID.  She was actually tested 3 times during that admission and all were negative.  She was put on a steroid Dosepak and another round of azithromycin.  Her Lasix 80 mg twice daily home dose was changed to Lasix 60 mg IV twice daily while she was hospitalized in the event that she had superimposed diastolic heart failure.  During her hospitalization her amlodipine was initially held then resumed at discharge.  Her valsartan 320 mg was stopped.  Her weight on discharge 09/01/2019 was 277 pounds which is unchanged from her admission weight and her office weight 08/16/2019.  Patient was contacted for follow-up.  She already had this appointment made, she was just discharged yesterday.  Since discharge she says she has been feeling well, she is taking all her medications as prescribed.  Her blood pressure at home is elevated.  Initially it was 200/100, on recheck by her it was 170/100.  She is not had increasing shortness of breath and was able to carry out a conversation on the phone without obvious distress.  The patient does not have symptoms concerning for COVID-19 infection (fever, chills,  cough, or new shortness of breath).    Past Medical History:  Diagnosis Date  . Abdominal hernia   . Anginal pain ([place])    occ from asthma  . Anxiety   . Arthritis   . Asthma   . Bipolar affective disorder ([place])    Schizophrenia  . Bronchitis    hx of  . CHF (congestive heart failure) ([place])   . Depression   . Diabetes mellitus without complication ([place])    " New Onset " per patient  . GERD (gastroesophageal reflux disease)   . Heart murmur   . Hypertension    takes  meds  . Lymphedema 06/07/2018  . Morbid (severe) obesity due to excess calories ([place])   . Neuropathy    left leg , "from back surgery"  . Overactive bladder   . Schizophrenia ([place])   . Sciatica   . Shortness of breath   . Sleep apnea   . Snoring disorder    Pt stated my boyfriend always wakes me up and tells me to breathe   Past Surgical History:  Procedure Laterality Date  . BACK SURGERY     3 back surgeries  . CHOLECYSTECTOMY    . COLONOSCOPY WITH PROPOFOL N/A 01/23/2016   Procedure: COLONOSCOPY WITH PROPOFOL;  Surgeon: Wonda Horner, MD;  Location: [facility];  Service: Endoscopy;  Laterality: N/A;  . EYE SURGERY     Metal plate in right eye. Had fracture in right eye  . gallstones reomved    . KNEE ARTHROSCOPY WITH MENISCAL REPAIR Right 09/28/2016   Procedure: KNEE ARTHROSCOPY WITH MENISCAL REPAIR;  Surgeon: Dorna Leitz, MD;  Location: [place];  Service: Orthopedics;  Laterality: Right;  Right partial meniscectomy and chondroplasty, patellar/femoral joint and medial femoral condyle   . LUMBAR LAMINECTOMY/DECOMPRESSION MICRODISCECTOMY  04/01/2012   Procedure: LUMBAR LAMINECTOMY/DECOMPRESSION MICRODISCECTOMY 2 LEVELS;  Surgeon: Faythe Ghee, MD;  Location: [facility];  Service: Neurosurgery;  Laterality: Left;  Lumbar four-five, lumbar five sacral one microdiscectomy   . LUMBAR WOUND DEBRIDEMENT  04/29/2012   Procedure: LUMBAR WOUND DEBRIDEMENT;  Surgeon: Faythe Ghee, MD;  Location: [place] [facility];  Service: Neurosurgery;  Laterality: N/A;  lumbar wound debridement  . ROTATOR CUFF REPAIR     Right shoulder  . TUBAL LIGATION       Current Meds  Medication Sig  . albuterol (PROAIR HFA) 108 (90 Base) MCG/ACT inhaler Inhale 2 puffs into the lungs every 4 (four) hours as needed for wheezing or shortness of breath.  [name] albuterol (PROVENTIL) (2.5 MG/3ML) 0.083% nebulizer solution Take 2.5 mg by nebulization every 4 (four) hours as needed for wheezing or shortness of breath.  .  ARIPiprazole (ABILIFY) 20 MG tablet Take 1 tablet (20 mg total) by mouth at bedtime.  [name] azithromycin (ZITHROMAX) 500 MG tablet Take 1 tablet (500 mg total) by mouth daily.  . bisoprolol (ZEBETA) 5 MG tablet One twice daily (Patient taking differently: Take 5 mg by mouth 2 (two) times daily. )  . blood glucose meter kit and supplies KIT Dispense based on patient and insurance preference. Use up to four times daily as directed. (FOR ICD-9 250.00, 250.01).  . budesonide-formoterol (SYMBICORT) 160-4.5 MCG/ACT inhaler Inhale 2 puffs into the lungs 2 (two) times daily.  . diclofenac Sodium (VOLTAREN) 1 % GEL Apply 1 application topically 2 (two) times daily. For knee and back pain  . DULoxetine (CYMBALTA) 30 MG capsule Take 30 mg by mouth 2 (two) times daily.  [name]  furosemide (LASIX) 80 MG tablet Take 1 tablet (80 mg total) by mouth 2 (two) times daily.  [name] gabapentin (NEURONTIN) 300 MG capsule Take 300 mg by mouth 3 (three) times daily.   [name] glimepiride (AMARYL) 1 MG tablet Take 1 tablet (1 mg total) by mouth daily with breakfast.  . HYDROcodone-acetaminophen (NORCO/VICODIN) 5-325 MG tablet Take 1-2 tablets by mouth See admin instructions. Take 2 tablets by mouth daily at bedtime, may also take 1-2 tablets every 4 hours as needed for pain  . insulin aspart protamine - aspart (NOVOLOG 70/30 MIX) (70-30) 100 UNIT/ML FlexPen Inject 0.18 mLs (18 Units total) into the skin 2 (two) times daily with a meal.  . Insulin Pen Needle (NOVOFINE) 30G X 8 MM MISC Inject 10 each into the skin as needed.  [name] ipratropium (ATROVENT HFA) 17 MCG/ACT inhaler Inhale 2 puffs into the lungs every 6 (six) hours.  . meloxicam (MOBIC) 15 MG tablet Take 1 tablet (15 mg total) by mouth daily as needed for pain. (Patient taking differently: Take 15 mg by mouth daily. )  . methocarbamol (ROBAXIN) 500 MG tablet Take 500 mg by mouth 2 (two) times daily.   . montelukast (SINGULAIR) 10 MG tablet Take 1 tablet (10 mg total) by mouth at bedtime.   . nicotine (NICODERM CQ - DOSED IN MG/24 HOURS) 21 mg/24hr patch Place 1 patch (21 mg total) onto the skin daily.  [name] omeprazole (PRILOSEC) 40 MG capsule Take 1 capsule (40 mg total) by mouth daily.  . potassium chloride SA (K-DUR,KLOR-CON) 20 MEQ tablet Take 20 mEq by mouth 3 (three) times a week.   . predniSONE (DELTASONE) 20 MG tablet Take 2 tablets (40 mg total) by mouth daily with breakfast.  . PRESCRIPTION MEDICATION Inhale into the lungs at bedtime. CPAP  . Respiratory Therapy Supplies (FLUTTER) DEVI Use flutter device 3 times a day  . Triamcinolone Acetonide 0.025 % LOTN Apply 1 application topically 3 (three) times daily.     Allergies:   Aspirin   Social History   Tobacco Use  . Smoking status: Current Every Day Smoker    Packs/day: 1.00    Years: 24.00    Pack years: 24.00    Types: Cigarettes  . Smokeless tobacco: Never Used  Substance Use Topics  . Alcohol use: No    Alcohol/week: 0.0 standard drinks    Comment: quit Nov. 2014  . Drug use: No     Family Hx: The patient's family history includes Cancer in her paternal aunt; Diabetes in her father and mother; Heart disease in her paternal aunt; Hypertension in her mother.  ROS:   Please see the history of present illness.    All other systems reviewed and are negative.   Prior CV studies:   The following studies were reviewed today: Echo Aug 2019   Labs/Other Tests and Data Reviewed:    EKG:  An ECG dated 08/28/2019 was personally reviewed today and demonstrated:  NSR, ST, poor anterior RW  Recent Labs: 08/16/2019: NT-Pro BNP 38 08/28/2019: B Natriuretic Peptide 26.9 08/29/2019: TSH 0.331 08/30/2019: ALT 39; Hemoglobin 13.5; Magnesium 2.1; Platelets 219 08/31/2019: BUN 20; Creatinine, Ser 1.01; Potassium 4.0; Sodium 139   Recent Lipid Panel Lab Results  Component Value Date/Time   CHOL 148 06/07/2018 10:24 AM   TRIG 163 (H) 08/28/2019 11:00 AM   HDL 51 06/07/2018 10:24 AM   CHOLHDL 2.9 06/07/2018  10:24 AM   CHOLHDL 2.7 05/25/2015 03:33 AM   LDLCALC 76 06/07/2018 10:24  AM    Wt Readings from Last 3 Encounters:  09/01/19 277 lb (125.6 kg)  08/30/19 277 lb 1.9 oz (125.7 kg)  08/16/19 277 lb (125.6 kg)     Objective:    Vital Signs:  BP (!) 170/100   Pulse 94   Ht _0  (1.575 m)   Wt 277 lb (125.6 kg)   LMP 10/15/2018 Comment: patient had pregnancy test with PCP (test was negative)  BMI 50.66 kg/m    VITAL SIGNS:  reviewed  ASSESSMENT & PLAN:    Acute on chronic COPD exacerbation Seen initially 08/16/2019 as an OP and then admitted 08/28/2019- COVID negative x 3. Improved with ABs and steroids.   COPD mixed type ([place]) Followed by [name]- multiple inhalers, she continues to smoke   Morbid obesity due to excess calories ([facility]) BMI 50  OSA (obstructive sleep apnea) On C-pap  Chronic diastolic heart failure ([place]) Echo Jan 2019- EF 60-65% with mild LVH (no significant diastolic dysfunction noted).  Her weight is unchanged from Aug 2020 OV.   Lymphedema She is on Lasix 80 mg BID, Maxzide stopped during her recent admission.    HTN- Poor control- resume Diovan at 160 mg daily- f/u in two weeks with BMP- we may need to increase to her previous dose of 320 mg daily.   COVID-19 Education: The signs and symptoms of COVID-19 were discussed with the patient and how to seek care for testing (follow up with PCP or arrange E-visit).  The importance of social distancing was discussed today.  Time:   Today, I have spent 15 minutes with the patient with telehealth technology discussing the above problems.     Medication Adjustments/Labs and Tests Ordered: Current medicines are reviewed at length with the patient today.  Concerns regarding medicines are outlined above.   Tests Ordered: No orders of the defined types were placed in this encounter.   Medication Changes: No orders of the defined types were placed in this encounter.   Follow Up:  Virtual Visit  [facility] 10-14 days  Signed, Kerin Ransom, Hershal Coria  09/01/2019 10:21 AM    [place]

## 2019-09-01 NOTE — Assessment & Plan Note (Signed)
Admitted 12/14-12/17/2020 with acute on chronic COPD exacerbation (tested negative for COVID 3 times during that admission).

## 2019-09-01 NOTE — Assessment & Plan Note (Signed)
Echo Jan 2019- EF 60-65% with mild LVH (no significant diastolic dysfunction noted).  Pro BNP was WNL-38 on 08/18/2019

## 2019-09-02 ENCOUNTER — Other Ambulatory Visit: Payer: Self-pay | Admitting: Cardiology

## 2019-09-02 LAB — CULTURE, BLOOD (ROUTINE X 2)
Culture: NO GROWTH
Culture: NO GROWTH
Special Requests: ADEQUATE

## 2019-09-18 ENCOUNTER — Other Ambulatory Visit: Payer: Self-pay

## 2019-09-18 ENCOUNTER — Ambulatory Visit: Payer: Medicaid Other | Admitting: Internal Medicine

## 2019-09-18 ENCOUNTER — Encounter: Payer: Self-pay | Admitting: Internal Medicine

## 2019-09-18 DIAGNOSIS — F17210 Nicotine dependence, cigarettes, uncomplicated: Secondary | ICD-10-CM | POA: Diagnosis not present

## 2019-09-18 DIAGNOSIS — J4530 Mild persistent asthma, uncomplicated: Secondary | ICD-10-CM

## 2019-09-18 MED ORDER — BREZTRI AEROSPHERE 160-9-4.8 MCG/ACT IN AERO: 2.0000 | INHALATION_SPRAY | Freq: Two times a day (BID) | RESPIRATORY_TRACT | 11 refills | Status: DC

## 2019-09-18 MED ORDER — BREZTRI AEROSPHERE 160-9-4.8 MCG/ACT IN AERO: 2.0000 | INHALATION_SPRAY | Freq: Two times a day (BID) | RESPIRATORY_TRACT | 0 refills | Status: DC

## 2019-09-18 MED ORDER — PREDNISONE 10 MG PO TABS: ORAL_TABLET | ORAL | 0 refills | Status: DC

## 2019-09-18 NOTE — Patient Instructions (Addendum)
Prednisone 10 mg take  4 each am x 2 days,   2 each am x 2 days,  1 each am x 2 days and stop   Plan A = Automatic = Always=    Breztri  Take 2 puffs first thing in am and then another 2 puffs about 12 hours later.   Work on inhaler technique:  relax and gently blow all the way out then take a nice smooth deep breath back in, triggering the inhaler at same time you start breathing in.  Hold for up to 5 seconds if you can. Blow out thru nose. Rinse and gargle with water when done     Plan B = Backup (to supplement plan A, not to replace it) Only use your albuterol inhaler as a rescue medication to be used if you can't catch your breath by resting or doing a relaxed purse lip breathing pattern.  - The less you use it, the better it will work when you need it. - Ok to use the inhaler up to 2 puffs  every 4 hours if you must but call for appointment if use goes up over your usual need - Don't leave home without it !!  (think of it like the spare tire for your car)   Plan C = Crisis (instead of Plan B but only if Plan B stops working) - only use your albuterol nebulizer if you first try Plan B and it fails to help > ok to use the nebulizer up to every 4 hours but if start needing it regularly call for immediate appointment   Prednisone 10 mg take  4 each am x 2 days,   2 each am x 2 days,  1 each am x 2 days and stop   The key is to stop smoking completely before smoking completely stops you!  For smoking cessation classes call [phone]      Please schedule a follow up office visit in 4 weeks, sooner if needed  with all medications /inhalers/ solutions in hand so we can verify exactly what you are taking. This includes all medications from all doctors and over the counters

## 2019-09-18 NOTE — Progress Notes (Signed)
Subjective:     Patient ID: Lori Bean, female   DOB: Sep 25, 1970      MRN: [id number]    Brief patient profile:  49 yobf MO active smoker  healthy child/ 2 IUP's  With baseline wt < 200 with new doe x 2012 assoc with wt gain gain x 2016 referred to pulmonary clinic 06/04/2017 by Dr   Vista Lawman with dx mild asthma ? Early copd   Admit date: 12/17/2016 Discharge date: 12/18/2016  Discharge Diagnoses:  Possible asthma exacerbation Essential hypertension Chronic diastolic heart failure Prediabetes Morbid obesity  History of present illness:  49 y.o.femalewith medical history significant of Asthma and OSA (reversibility demonstrated on PFTs in Feb this year), smoking, HTN, CHF grade 2 diastolic dysfunction. Patient presents to the ED with c/o SOB. Symptoms onset at rest yesterday, also has 10lb weight gain over past 3 weeks but states she is compliant with lasix 80 BID. 2 albuterol nebs before seeing PCP this AM. PCP sent her to ED for evaluation.  Hospital Course:  Possible asthma exacerbation -Patient states that her wheezing has actually improved and she is feeling better today -She recently finished a course of prednisone back in February 2018 -She has been following up with the pulmonary clinic -Chest x-ray -Spoke with Dr. [name], patient's pulmonologist. Of note, progress note from Dr. [name] back in January 2018 stated that he was not convinced of her asthma. At this time, Dr. [name] recommended no change in patient's medications. Follow-up as an outpatient. -Continue Advair Diskus, pro-air, QVAR -Patient was placed on prednisone 40 mg, will continue this for 4 days.  Essential hypertension -Continue amlodipine -Lasix was discontinued as she does complain of chronic cough  Chronic diastolic heart failure -Echocardiogram 05/24/2015 showed EF of 0263%, grade 2 diastolic dysfunction -Currently appears euvolemic and compensated on exam. -BNP 24.8 -Continue home Lasix  dose with potassium supplementation  Prediabetes -Hemoglobin A1c was 6.1 back in 2016. This can be followed and managed as an outpatient by patient's primary care physician.  Morbid obesity -BMI 47.1 -Patient only talk to her primary care physician regarding weight loss and lifestyle modifications. -Patient states she feels that she has gained 10-15 pounds in the past several weeks. States she has been watching what she eats. Admits that she has been using different scales at different doctors offices. Inquires about whether there is a program that offers free scales. Case management stated that this program is no longer available.       06/04/2017 1st   office visit/ Lori Bean  Re transition of care  Chief Complaint  Patient presents with  . Pulmonary Consult    Referred by Dr. Vista Lawman. Pt c/o DOE for approx 6 years, worse over the past wk to the point that she is winded just walking from room to room at home. She states also having some prod cough with large amounts of black colored sputum.  She has CP "stabbing pain" comes and goes and lasts for several minutes. She has a rash on her arm- not improving after seeing derm. She has also noticed blurred vision. She is using her proair inhaler 3-4 x per day.   first started on proair helped some, then symbicort sev months prior to OV>  Not much response, even neb now doesn't always help. 6 years prior to OV  Trouble flat and x 6 months sleeping upright due to sob immediately on lying back Doe progressive   But esp over the last week to room  to room now Coughing more since 2 months > black mucus assoc with cp with coughing  rec Plan A = Automatic = symbicort 80 Take 2 puffs first thing in am and then another 2 puffs about 12 hours later.  Pantoprazole (protonix) 40 mg (or prilosec 20 x 2)    Take  30-60 min before first meal of the day and Pepcid (famotidine)  20 mg one @  bedtime until return to office - this is the best way to tell whether  stomach acid is contributing to your problem.   Plan B = Backup Only use your albuterol as a rescue medication Plan C = Nebulizer  GERD diet   07/22/18 NP eval [name]  Continue on Symbicort Twice daily  . Rinse after use.  Work on not smoking    10/28/2017  f/u ov/Lori Bean re: sob/ ? How much is asthma / still smoking  Chief Complaint  Patient presents with  . Follow-up    Breathing has been worse for the past wk- SOB walking from room to room. She is also c/o wheezing and some chest tightness. She is using her albuterol inhaler 4-5 x per day on average.   Dyspnea: room to room x one week Cough: more than usual x one week thick clear/ assoc st and dysphagia ? On ppi ac ?  Sleep: ok on cpap s 02   Using saba q am instead of prn  Midline pain with coughing only  rec Plan A = Automatic = change symbicort to 160 Take 2 puffs first thing in am and then another 2 puffs about 12 hours later.  Work on inhaler technique:     Plan B = Backup Only use your albuterol as a rescue medication Plan C = Crisis - only use your albuterol nebulizer if you first try Plan B       03/04/2018  f/u ov/Lori Bean re: AB on symbicort 160/ acei/ coreg 25 bid  Chief Complaint  Patient presents with  . Follow-up    still SOB, sore throat, difficulty swallowing, productive cough   Dyspnea:  Room to room Cough: is worse at hs / like choking /  [place] mucus/ nose stuffy now all x 2 months on acei gradually worse  Sleep: on cpap with some cough/gag  SABA use:  None rec Plan A = Automatic = change symbicort to 80 Take 2 puffs first thing in am and then another 2 puffs about 12 hours later.  Work on inhaler technique:   Plan B = Backup Only use your albuterol as a rescue medication Plan C = Crisis - only use your albuterol nebulizer if you first try Plan B stop lisinopril and take avapro (ibesartan) 300 mg one daily in its place  Augmentin 875 mg take one pill twice daily  X 10 days - Prednisone 10 mg take  4 each am  x 2 days,   2 each am x 2 days,  1 each am x 2 days and stop  Please schedule a follow up office visit in 4 weeks, sooner if needed to see [name] NP but bring all meds with you      NP ov 04/01/18 rec Increase Symbicort 160/4.50mg 2 puffs Twice daily  , rinse after use.  Mucinex DM Twice daily  As needed  Cough/congestion  Work on not smoking .  Continue on BIPAP At bedtime   Low salt diet , keep legs elevated.  Follow up with Cardiology as discussed.  07/01/18 ER eval for sob Dx  chf  rx lasix     07/04/2018  Extended transition of care  f/u ov/Lori Bean post er eval  re: copd/ab still smoking/ over using saba, did not use symb yet today and hfa poor / did not bring meds Chief Complaint  Patient presents with  . Follow-up    chronic asthma, patient uses albuterol TID  Dyspnea:  MMRC2 = can't walk a nl pace on a flat grade s sob but does fine slow and flat can shop HT/ [employer]  Cough: worse x sev weeks, better while on zpak worse off/ brown mucus 24/7 / assoc with overt HB Sleeping: on cpap, not bipap, bothered by cough / has not had any sleep f/u by [name] in years  SABA use: as above  rec Plan A = Automatic = symbicort 160 Take 2 puffs first thing in am and then another 2 puffs about 12 hours later.  Work on inhaler technique:    Plan B = Backup Only use your albuterol as a rescue medication  07/20/18  NP eval Start Zyrtec- take daily during allergy season or until symptoms improve  Start Mucinex twice daily for congestion  Flutter valve three times a day for congestion  Continue Symbicort 2 puffs twice a day (every day) Back up- Albuterol rescue inhaler 2 puffs every 4-6 hours as needed only for sob/wheezing     08/17/18 NP rec Pulmonary function test showed some restriction and mild obstruction consistent with early COPD - likely due to smoking and weight  Continue to work on quitting smoking  Adding Singulair, take once daily at bedtime- this is to help with allergy  symptoms and prevent asthma exacerbations (sent in RX) Adding Spiriva, take 2 puffs daily (given sample, RX sent to pharmacy) Continue Symbicort 160 - 2 puffs twice a day  Nicotine patch (RX sent)      11/21/2018  f/u ov/Lori Bean re: copd/ ab  Still smoking/ maint on symb/spiriva  Chief Complaint  Patient presents with  . Follow-up    feeling about the same - cough (prod-brownish)    Dyspnea:  Room to room / does [employer] once week able to do an aisle leaning on cart = MMRC3 = can't walk 100 yards even at a slow pace at a flat grade s stopping due to sob   Cough: worse than usual x 2 weeks  When lies down with slt brown color, thicker than usual  Sleeping: on back 3 pillows and cpap  SABA use: not very helpful though hfa poor  02: none rec Zpak, No change in other medications Work on inhaler technique:  relax and gently blow all the way out then take a nice smooth deep breath back in, triggering the inhaler at same time you start breathing in.  Hold for up to 5 seconds if you can. Blow out thru nose. Rinse and gargle with water when done Only use your albuterol as a rescue medication to be used The key is to stop smoking completely before smoking completely stops you! - For smoking cessation classes call [phone]    Please schedule a follow up visit in 3 months but call sooner if needed and bring your inhalers with you  Add:  May need trial off coreg and on bisoprolol      02/13/2019  f/u ov/Ernesto Lashway re: copd/ab did not bring all  Inhalers / still on coreg 25 bid Chief Complaint  Patient presents with  . Follow-up  Chronic Asthma - patient stated medications are working for her, no issues.  Dyspnea:  Does [employer] once a week leaning on cart/ crosses parking lot  Cough: none  Sleeping: on bed / flat with 3 pillows / with cpap  SABA use: less than once a week 02: none rec Stop the coreg and start bisoprolol 5 gm one pill twice daily  Work on inhaler technique The key is to stop  smoking completely before smoking completely stops you!   Admit date: 08/28/2019 Discharge date: 08/31/2019    Recommendations for Outpatient Follow-up:  1. Complete azithromycin prednisone as an outpatient 2. New start to 70/30 insulin twice a day and Amaryl-patient cautioned on checking blood sugars at least twice a day with instructions given-supplies pen and pen needles given 3. Patient discharged with 3 in 1 commode on discharge 4. Some medications have changed please note changes including discontinuation ARB and Maxide  Discharge Diagnoses:  Active Problems:   Essential hypertension   Bipolar affective disorder (HCC)   GERD (gastroesophageal reflux disease)   Chronic diastolic heart failure (HCC)   OSA (obstructive sleep apnea)   Lymphedema   COPD with acute exacerbation ([place])   COPD exacerbation ([place])   Discharge Condition: Improved  Diet recommendation: Heart healthy       Filed Weights   08/30/19 0400 08/30/19 1800 08/30/19 2100  Weight: 125.8 kg 125.8 kg 125.7 kg    History of present illness:  49blk Femasthma/OSA, prior smoker, HTN, HFpEF EF 60-65% 04/14/2018 PASP 22-chronic back pain status post lumbar fusion 12/28/2013, venous thrombosis superficial 2013 schizophrenia Admitted 12/16 with 2 weeks thick productive sputum Rx bronchitis Z-Pak exposed to son who tested positive for Covid Rapid Covid test negative PCR test negative-repeat test 12:16 AM also negative  Hospital Course:  Baseline weight 05/01/2019 was 122kgon 02/14/2019 then went to 124 kg 05/01/2019 finally was 125.6 kg at cardiology office visit on admission 125.8   Assessment & Plan: Coronavirus negative x3 No further precautions transition to regular telemetry unit--biomarkers are not indicative acute inflammatory process arguing further against this however she will need to self isolate from her family if they are positive and figure this out in the outpatient setting Acute hypoxic  respiratory failure Combination of probable acute bronchitis + heart failure Rx Solu-Medrol every 12/14 given prednisone 40 to finish off a burst Home dose Lasix 80 twice daily changed to 60 IV twice daily 12/16-was changed to home dose 80 mg twice a day on discharge should take an extra 80 mg if he gains more than 2 kg in 24 hours Continue azithromycin until 12/19 HTN HFpEF Continue bisoprolol 5 twice daily, resume amlodipine 10-this admission discontinued Maxide daily, Diovan 80 daily and this may need to be reimplemented OSA Resume during hospital stay 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 Stable and understands how to care for self medications-meter ordered in addition to supplies Bipolar Continue Abilify 20 at bedtime, Cymbalta 30 twice daily   09/18/2019  Post hosp  f/u ov/Lori Bean re:  AB/ did not bring meds  Chief Complaint  Patient presents with  . Follow-up    SOB, cough, congestion, mucus is grey  baseline taking symb/spiriva daily and with activity just once  Then gradually worse for a week prior to admit and did not call  Dyspnea:  Now riding scooter /  parks as close as she can  Cough: mostly hs / min mucoid  Sleeping: cough wakes her up / water helps  SABA use: using hfa only, up to twice on avg no longer neb  02: none    No obvious day to day or daytime variability or assoc excess/ purulent sputum or mucus plugs or hemoptysis or cp or chest tightness, subjective wheeze or overt sinus or hb symptoms.     Also denies any obvious fluctuation of symptoms with weather or environmental changes or other aggravating or alleviating factors except as outlined above   No unusual exposure hx or h/o childhood pna/ asthma or knowledge of premature birth.  Current Allergies, Complete Past Medical History,  Past Surgical History, Family History, and Social History were reviewed in [employer] record.  ROS  The following are not active complaints unless bolded Hoarseness, sore throat, dysphagia, dental problems, itching, sneezing,  nasal congestion or discharge of excess mucus or purulent secretions, ear ache,   fever, chills, sweats, unintended wt loss or wt gain, classically pleuritic or exertional cp,  orthopnea pnd or arm/hand swelling  or leg swelling, presyncope, palpitations, abdominal pain, anorexia, nausea, vomiting, diarrhea  or change in bowel habits or change in bladder habits, change in stools or change in urine, dysuria, hematuria,  rash, arthralgias, visual complaints, headache, numbness, weakness or ataxia or problems with walking or coordination,  change in mood or  memory.        Current Meds- - NOTE:   Unable to verify as accurately reflecting what pt takes     Medication Sig  . albuterol (PROAIR HFA) 108 (90 Base) MCG/ACT inhaler Inhale 2 puffs into the lungs every 4 (four) hours as needed for wheezing or shortness of breath.  [name] albuterol (PROVENTIL) (2.5 MG/3ML) 0.083% nebulizer solution Take 2.5 mg by nebulization every 4 (four) hours as needed for wheezing or shortness of breath.  [name] amLODipine (NORVASC) 10 MG tablet Take 1 tablet (10 mg total) by mouth daily.  . ARIPiprazole (ABILIFY) 20 MG tablet Take 1 tablet (20 mg total) by mouth at bedtime.  [name] azithromycin (ZITHROMAX) 500 MG tablet Take 1 tablet (500 mg total) by mouth daily.  . bisoprolol (ZEBETA) 5 MG tablet One twice daily (Patient taking differently: Take 5 mg by mouth 2 (two) times daily. )  . blood glucose meter kit and supplies KIT Dispense based on patient and insurance preference. Use up to four times daily as directed. (FOR ICD-9 250.00, 250.01).  . budesonide-formoterol (SYMBICORT) 160-4.5 MCG/ACT inhaler Inhale 2 puffs into the lungs 2 (two) times daily.  . diclofenac Sodium (VOLTAREN) 1 %  GEL Apply 1 application topically 2 (two) times daily. For knee and back pain  . DULoxetine (CYMBALTA) 30 MG capsule Take 30 mg by mouth 2 (two) times daily.  . furosemide (LASIX) 80 MG tablet Take 1 tablet (80 mg total) by mouth 2 (two) times daily.  [name] gabapentin (NEURONTIN) 300 MG capsule Take 300 mg by mouth 3 (three) times daily.   [name] glimepiride (AMARYL) 1 MG tablet Take 1 tablet (1 mg total) by mouth daily with breakfast.  . HYDROcodone-acetaminophen (NORCO/VICODIN) 5-325 MG tablet Take 1-2 tablets by mouth See admin instructions. Take 2 tablets by mouth daily at bedtime, may also take 1-2 tablets every 4 hours as needed for pain  . insulin aspart protamine - aspart (NOVOLOG 70/30 MIX) (70-30) 100 UNIT/ML FlexPen Inject 0.18 mLs (18 Units total) into the skin 2 (two) times  daily with a meal.  . Insulin Pen Needle (NOVOFINE) 30G X 8 MM MISC Inject 10 each into the skin as needed.  [name] ipratropium (ATROVENT HFA) 17 MCG/ACT inhaler Inhale 2 puffs into the lungs every 6 (six) hours.  . meloxicam (MOBIC) 15 MG tablet Take 1 tablet (15 mg total) by mouth daily as needed for pain. (Patient taking differently: Take 15 mg by mouth daily. )  . methocarbamol (ROBAXIN) 500 MG tablet Take 500 mg by mouth 2 (two) times daily.   . montelukast (SINGULAIR) 10 MG tablet Take 1 tablet (10 mg total) by mouth at bedtime.  . nicotine (NICODERM CQ - DOSED IN MG/24 HOURS) 21 mg/24hr patch Place 1 patch (21 mg total) onto the skin daily.  [name] omeprazole (PRILOSEC) 40 MG capsule Take 1 capsule (40 mg total) by mouth daily.  . potassium chloride SA (K-DUR,KLOR-CON) 20 MEQ tablet Take 20 mEq by mouth 3 (three) times a week.   . predniSONE (DELTASONE) 20 MG tablet Take 2 tablets (40 mg total) by mouth daily with breakfast.  . PRESCRIPTION MEDICATION Inhale into the lungs at bedtime. CPAP  . Respiratory Therapy Supplies (FLUTTER) DEVI Use flutter device 3 times a day  . Triamcinolone Acetonide 0.025 % LOTN Apply 1 application  topically 3 (three) times daily.  . valsartan (DIOVAN) 160 MG tablet Take 1 tablet (160 mg total) by mouth daily.                      Objective:   Physical Exam    amb obese bf nad    09/18/2019         280  02/13/2019         269  11/21/2018          264 07/04/2018      245  03/04/2018       240  10/28/2017       252   06/04/17 250 lb (113.4 kg)  05/14/17 250 lb 12.8 oz (113.8 kg)  05/06/17 242 lb (109.8 kg)     Vital signs reviewed - Note on arrival 02 sats  100% on RA     HEENT : pt wearing mask not removed for exam due to covid -19 concerns.    NECK :  without JVD/Nodes/TM/ nl carotid upstrokes bilaterally   LUNGS: no acc muscle use,  Nl contour chest with distant bs and minimal insp/exp rhonchi  bilaterally without cough on insp or exp maneuvers   CV:  RRR  no s3 or murmur or increase in P2, and 1-2+ pitting both LE's   ABD:  soft and nontender with nl inspiratory excursion in the supine position. No bruits or organomegaly appreciated, bowel sounds nl  MS:  Nl gait/ ext warm without deformities, calf tenderness, cyanosis or clubbing No obvious joint restrictions   SKIN: warm and dry without lesions    NEURO:  alert, approp, nl sensorium with  no motor or cerebellar deficits apparent.       I personally reviewed images and agree with radiology impression as follows:   Chest CTa 08/28/2019  1. No pulmonary emboli. 2. No acute abnormality in the chest, abdomen or pelvis. 3. Marked diffuse hepatic steatosis.    Assessment:

## 2019-09-19 ENCOUNTER — Encounter: Payer: Self-pay | Admitting: Cardiology

## 2019-09-19 ENCOUNTER — Telehealth: Payer: Self-pay | Admitting: *Deleted

## 2019-09-19 ENCOUNTER — Encounter: Payer: Self-pay | Admitting: Internal Medicine

## 2019-09-19 ENCOUNTER — Telehealth (INDEPENDENT_AMBULATORY_CARE_PROVIDER_SITE_OTHER): Payer: Medicaid Other | Admitting: Cardiology

## 2019-09-19 VITALS — BP 178/92 | HR 84 | Ht 62.0 in | Wt 280.0 lb

## 2019-09-19 DIAGNOSIS — I5032 Chronic diastolic (congestive) heart failure: Secondary | ICD-10-CM | POA: Diagnosis not present

## 2019-09-19 DIAGNOSIS — I110 Hypertensive heart disease with heart failure: Secondary | ICD-10-CM

## 2019-09-19 DIAGNOSIS — J449 Chronic obstructive pulmonary disease, unspecified: Secondary | ICD-10-CM

## 2019-09-19 DIAGNOSIS — I10 Essential (primary) hypertension: Secondary | ICD-10-CM

## 2019-09-19 DIAGNOSIS — I890 Lymphedema, not elsewhere classified: Secondary | ICD-10-CM

## 2019-09-19 NOTE — Assessment & Plan Note (Signed)
Counseled re importance of smoking cessation but did not meet time criteria for separate billing   °

## 2019-09-19 NOTE — Patient Instructions (Signed)
Medication Instructions:  No changes  *If you need a refill on your cardiac medications before your next appointment, please call your pharmacy*  Lab Work: [facility]  If you have labs (blood work) drawn today and your tests are completely normal, you will receive your results only by: [name] MyChart Message (if you have MyChart) OR . A paper copy in the mail If you have any lab test that is abnormal or we need to change your treatment, we will call you to review the results.  Testing/Procedures: Not needed  Follow-Up: At [facility], you and your health needs are our priority.  As part of our continuing mission to provide you with exceptional heart care, we have created designated Provider Care Teams.  These Care Teams include your primary Cardiologist (physician) and Advanced Practice Providers (APPs -  Physician Assistants and Nurse Practitioners) who all work together to provide you with the care you need, when you need it.  Your next appointment:   3 month(s)  The format for your next appointment:   In Person  Provider:   [name], MD

## 2019-09-19 NOTE — Assessment & Plan Note (Signed)
Onset of symptoms 2012 PFT's  11/10/2016  FEV1 2.06 (92 % ) ratio 81   p 24 % improvement from saba p ? prior to study with DLCO  93/94 % corrects to 102  % for alv volume   - 06/04/2017  After extensive coaching HFA effectiveness =    75% > try symbicort 80 2bid  - Spirometry 10/28/2017  FEV1 1.01 (45%)  Ratio 67  p am symb/saba/smoking  - 10/28/2017   try symb 160 2bid  - 03/04/2018  A try symb 80 2bid due to uacs component  - 04/01/18 try sym 160 2bid - Spirometry 07/04/2018  FEV1 1.1 (50%)  Ratio 69  Prior to rx - -  PFT's  07/20/18   FEV1 1.33 (60 % ) ratio 75  p 15 % improvement from saba p nothing prior to study with DLCO  81/78c % corrects to 104 % for alv volume  With ERV 6 and min curvature to f/v loop  And mild air trapping  - 02/13/2019 try off coreg and on bisoprolol - 09/18/2019  After extensive coaching inhaler device,  effectiveness =    75% (short Ti)   > try change to bevespi 2bid      COPD 0= AB and  Group D in terms of symptom/risk and laba/lama/ICS  therefore appropriate rx at this point >>>  Bevespi trial approp.  Used golfer analogy to help her how to use empty device of symbicort for "practice swings" prior to using bevespi and Prednisone 10 mg take  4 each am x 2 days,   2 each am x 2 days,  1 each am x 2 days and stop to treat this mild flare post admit.  Advised:  formulary restrictions will be an ongoing challenge for the forseable future and I would be happy to pick an alternative if the pt will first  provide me a list of them -  pt  will need to return here for training for any new device that is required eg dpi vs hfa vs respimat.    In the meantime we can always provide samples so that the patient never runs out of any needed respiratory medications.    Pt informed of the seriousness of COVID 19 infection as a direct risk to lung health  and safey and to close contacts and should continue to wear a facemask in public and minimize exposure to public locations but especially  avoid any area or activity where non-close contacts are not observing distancing or wearing an appropriate face mask.  I strongly recommended vaccine when offered.    I had an extended discussion with the patient and reviewed all relevant studies including extensive inpt records/ imaging and teaching HFA so total time was 40 minutes with moderate level of MDM.  Each maintenance medication was reviewed in detail including most importantly the difference between maintenance and prns and under what circumstances the prns are to be triggered using an action plan format that is not reflected in the computer generated alphabetically organized AVS.     Please see AVS for specific instructions unique to this visit that I personally wrote and verbalized to the the pt in detail and then reviewed with pt  by my nurse highlighting any  changes in therapy recommended at today's visit to their plan of care.

## 2019-09-19 NOTE — Assessment & Plan Note (Signed)
Body mass index is 51.36 kg/m.  -  trending up Lab Results  Component Value Date   TSH 0.331 (L) 08/29/2019     Contributing to gerd risk/ doe/reviewed the need and the process to achieve and maintain neg calorie balance > defer f/u primary care including intermittently monitoring thyroid status

## 2019-09-19 NOTE — Telephone Encounter (Signed)
RN left detail message for  Patient about  Instruction  from today's virtual visit 09/19/19 .  AVS SUMMARY has been sent by mychart.  Information about labs needed for this week or next  BMP with directions    Any question may call back .

## 2019-09-19 NOTE — Progress Notes (Signed)
Virtual Visit via Telephone Note   This visit type was conducted due to national recommendations for restrictions regarding the COVID-19 Pandemic (e.g. social distancing) in an effort to limit this patient's exposure and mitigate transmission in our community.  Due to her co-morbid illnesses, this patient is at least at moderate risk for complications without adequate follow up.  This format is felt to be most appropriate for this patient at this time.  The patient did not have access to video technology/had technical difficulties with video requiring transitioning to audio format only (telephone).  All issues noted in this document were discussed and addressed.  No physical exam could be performed with this format.  Please refer to the patient's chart for her  consent to telehealth for [facility].   Date:  09/19/2019   ID:  Lori Bean, DOB [birth year], MRN [id number]  Patient Location: Home Provider Location: Home  PCP:  [name], PA  Cardiologist:  [name], MD  Electrophysiologist:  None   Evaluation Performed:  Follow-Up Visit  Chief Complaint:  none  History of Present Illness:    Lori Bean is a 49 y.o. female with a history of chronic diastolic heart failure, (though her most recent echo from August 2019 showed an EF of 60 to 65% with mild LVH and no significant diastolic dysfunction). She has super morbid obesity with a BMI of 50. She has sleep apnea and is on CPAP. She has asthmatic COPD and is a smoker. Dr. [name] follows her. She has chronic lymphedema and is on high-dose diuretics. In February 2020 she was diagnosed with tongue cancer, she is followed by Dr. Tamela Oddi at [facility].  I saw her in the office 08/16/2019 with complaints of increasing shortness of breath and a productive cough with thick sputum.  She denied orthopnea.  Her weight was similar to her August 2020 weight although in reviewing this she had gradually increased  about 30 pounds since December 2019.  She denied any fever or chills.  Her oxygen level was 97% on room air.  At that time I felt she most likely had acute bronchitis and I prescribed a Z-Pak.  Initially she had some improvement but her symptoms recurred and she ended up in the emergency room 08/28/2019.  There was a high suspicion that she had COVID.  She was actually tested 3 times during that admission and all were negative.  She was put on a steroid Dosepak and another round of azithromycin.  Her Lasix 80 mg twice daily home dose was changed to Lasix 60 mg IV twice daily while she was hospitalized in the event that she had superimposed diastolic heart failure.  During her hospitalization [name] was low and her amlodipine was initially held then resumed at discharge.  Her valsartan 320 mg was stopped.   I saw her in follow up the day after her discharge in the office.  I resumed her valsartan at 160 mg daily (pre hospital dose was 320 mg daily).  She was contacted today for follow up.  She tells me [name] has been doing well and she denies any increased SOB.  [name] at Dr Gustavus Bryant yesterday was 128/ 53.  She had not taken her medications prior to [name] this morning.   The patient does not have symptoms concerning for COVID-19 infection (fever, chills, cough, or new shortness of breath).    Past Medical History:  Diagnosis Date  . Abdominal hernia   .  Anginal pain ([place])    occ from asthma  . Anxiety   . Arthritis   . Asthma   . Bipolar affective disorder ([place])    Schizophrenia  . Bronchitis    hx of  . CHF (congestive heart failure) ([place])   . Depression   . Diabetes mellitus without complication ([place])    " New Onset " per patient  . GERD (gastroesophageal reflux disease)   . Heart murmur   . Hypertension    takes meds  . Lymphedema 06/07/2018  . Morbid (severe) obesity due to excess calories ([place])   . Neuropathy    left leg , "from back surgery"  . Overactive bladder   .  Schizophrenia ([place])   . Sciatica   . Shortness of breath   . Sleep apnea   . Snoring disorder    Pt stated my boyfriend always wakes me up and tells me to breathe   Past Surgical History:  Procedure Laterality Date  . BACK SURGERY     3 back surgeries  . CHOLECYSTECTOMY    . COLONOSCOPY WITH PROPOFOL N/A 01/23/2016   Procedure: COLONOSCOPY WITH PROPOFOL;  Surgeon: Wonda Horner, MD;  Location: [facility];  Service: Endoscopy;  Laterality: N/A;  . EYE SURGERY     Metal plate in right eye. Had fracture in right eye  . gallstones reomved    . KNEE ARTHROSCOPY WITH MENISCAL REPAIR Right 09/28/2016   Procedure: KNEE ARTHROSCOPY WITH MENISCAL REPAIR;  Surgeon: Dorna Leitz, MD;  Location: [facility];  Service: Orthopedics;  Laterality: Right;  Right partial meniscectomy and chondroplasty, patellar/femoral joint and medial femoral condyle   . LUMBAR LAMINECTOMY/DECOMPRESSION MICRODISCECTOMY  04/01/2012   Procedure: LUMBAR LAMINECTOMY/DECOMPRESSION MICRODISCECTOMY 2 LEVELS;  Surgeon: Faythe Ghee, MD;  Location: [facility];  Service: Neurosurgery;  Laterality: Left;  Lumbar four-five, lumbar five sacral one microdiscectomy   . LUMBAR WOUND DEBRIDEMENT  04/29/2012   Procedure: LUMBAR WOUND DEBRIDEMENT;  Surgeon: Faythe Ghee, MD;  Location: [facility];  Service: Neurosurgery;  Laterality: N/A;  lumbar wound debridement  . ROTATOR CUFF REPAIR     Right shoulder  . TUBAL LIGATION       Current Meds  Medication Sig  . albuterol (PROAIR HFA) 108 (90 Base) MCG/ACT inhaler Inhale 2 puffs into the lungs every 4 (four) hours as needed for wheezing or shortness of breath.  [name] albuterol (PROVENTIL) (2.5 MG/3ML) 0.083% nebulizer solution Take 2.5 mg by nebulization every 4 (four) hours as needed for wheezing or shortness of breath.  [name] amLODipine (NORVASC) 10 MG tablet Take 1 tablet (10 mg total) by mouth daily.  . ARIPiprazole (ABILIFY) 20 MG tablet Take 1 tablet (20 mg total) by mouth at bedtime.  [name]  azithromycin (ZITHROMAX) 500 MG tablet Take 1 tablet (500 mg total) by mouth daily.  . bisoprolol (ZEBETA) 5 MG tablet One twice daily (Patient taking differently: Take 5 mg by mouth 2 (two) times daily. )  . blood glucose meter kit and supplies KIT Dispense based on patient and insurance preference. Use up to four times daily as directed. (FOR ICD-9 250.00, 250.01).  . Budeson-Glycopyrrol-Formoterol (BREZTRI AEROSPHERE) 160-9-4.8 MCG/ACT AERO Inhale 2 puffs into the lungs 2 (two) times daily.  . diclofenac Sodium (VOLTAREN) 1 % GEL Apply 1 application topically 2 (two) times daily. For knee and back pain  . DULoxetine (CYMBALTA) 30 MG capsule Take 30 mg by mouth 2 (two) times daily.  . furosemide (LASIX) 80 MG  tablet Take 1 tablet (80 mg total) by mouth 2 (two) times daily.  [name] gabapentin (NEURONTIN) 300 MG capsule Take 300 mg by mouth 3 (three) times daily.   [name] glimepiride (AMARYL) 1 MG tablet Take 1 tablet (1 mg total) by mouth daily with breakfast.  . HYDROcodone-acetaminophen (NORCO/VICODIN) 5-325 MG tablet Take 1-2 tablets by mouth See admin instructions. Take 2 tablets by mouth daily at bedtime, may also take 1-2 tablets every 4 hours as needed for pain  . insulin aspart protamine - aspart (NOVOLOG 70/30 MIX) (70-30) 100 UNIT/ML FlexPen Inject 0.18 mLs (18 Units total) into the skin 2 (two) times daily with a meal. (Patient taking differently: Inject 18 Units into the skin 3 (three) times daily with meals. )  . Insulin Pen Needle (NOVOFINE) 30G X 8 MM MISC Inject 10 each into the skin as needed.  . meloxicam (MOBIC) 15 MG tablet Take 1 tablet (15 mg total) by mouth daily as needed for pain. (Patient taking differently: Take 15 mg by mouth daily. )  . methocarbamol (ROBAXIN) 500 MG tablet Take 500 mg by mouth 2 (two) times daily.   . montelukast (SINGULAIR) 10 MG tablet Take 1 tablet (10 mg total) by mouth at bedtime.  [name] omeprazole (PRILOSEC) 40 MG capsule Take 1 capsule (40 mg total) by mouth  daily.  . potassium chloride SA (K-DUR,KLOR-CON) 20 MEQ tablet Take 40 mEq by mouth 2 (two) times daily.   . predniSONE (DELTASONE) 10 MG tablet Take  4 each am x 2 days,   2 each am x 2 days,  1 each am x 2 days and stop  . PRESCRIPTION MEDICATION Inhale into the lungs at bedtime. CPAP  . Respiratory Therapy Supplies (FLUTTER) DEVI Use flutter device 3 times a day  . Triamcinolone Acetonide 0.025 % LOTN Apply 1 application topically 3 (three) times daily.  . valsartan (DIOVAN) 160 MG tablet Take 1 tablet (160 mg total) by mouth daily.     Allergies:   Aspirin   Social History   Tobacco Use  . Smoking status: Current Every Day Smoker    Packs/day: 1.00    Years: 24.00    Pack years: 24.00    Types: Cigarettes  . Smokeless tobacco: Never Used  Substance Use Topics  . Alcohol use: No    Alcohol/week: 0.0 standard drinks    Comment: quit Nov. 2014  . Drug use: No     Family Hx: The patient's family history includes Cancer in her paternal aunt; Diabetes in her father and mother; Heart disease in her paternal aunt; Hypertension in her mother.  ROS:   Please see the history of present illness.    All other systems reviewed and are negative.   Prior CV studies:   The following studies were reviewed today: Echo Aug 2019   Labs/Other Tests and Data Reviewed:    EKG:  No ECG reviewed.  Recent Labs: 08/16/2019: NT-Pro BNP 38 08/28/2019: B Natriuretic Peptide 26.9 08/29/2019: TSH 0.331 08/30/2019: ALT 39; Hemoglobin 13.5; Magnesium 2.1; Platelets 219 08/31/2019: BUN 20; Creatinine, Ser 1.01; Potassium 4.0; Sodium 139   Recent Lipid Panel Lab Results  Component Value Date/Time   CHOL 148 06/07/2018 10:24 AM   TRIG 163 (H) 08/28/2019 11:00 AM   HDL 51 06/07/2018 10:24 AM   CHOLHDL 2.9 06/07/2018 10:24 AM   CHOLHDL 2.7 05/25/2015 03:33 AM   LDLCALC 76 06/07/2018 10:24 AM    Wt Readings from Last 3 Encounters:  09/19/19 280 lb (  127 kg)  09/18/19 280 lb 12.8 oz (127.4  kg)  09/01/19 277 lb (125.6 kg)     Objective:    Vital Signs:  BP (!) 178/92   Pulse 84   Ht '5\' 2"'$  (1.575 m)   Wt 280 lb (127 kg)   LMP 10/15/2018 Comment: patient had pregnancy test with PCP (test was negative)  BMI 51.21 kg/m    VITAL SIGNS:  reviewed  ASSESSMENT & PLAN:    Acute on chronic COPD exacerbation Seen initially 08/16/2019 as an OP and then admitted 08/28/2019- COVID negative x 3. Improved with ABs and steroids.   COPD mixed type ([place]) Followed by [name]- multiple inhalers, she continues to smoke  Morbid obesity due to excess calories ([facility]) BMI 50  OSA (obstructive sleep apnea) On C-pap  Chronic diastolic heart failure ([place]) Echo Jan 2019- EF 60-65% with mild LVH (no significant diastolic dysfunction noted). Her weight is unchanged from Aug 2020 OV.   Lymphedema She is on Lasix 80 mg BID, Maxzide stopped during her recent admission.    HTN- Appears to have better control with resumption of Diovan 160 mg.    Plan: Check BMP, continue to monitor B/P at home.  F/U Dr Oval Linsey on 3-4 months.   COVID-19 Education: The signs and symptoms of COVID-19 were discussed with the patient and how to seek care for testing (follow up with PCP or arrange E-visit).  The importance of social distancing was discussed today.  Time:   Today, I have spent 10 minutes with the patient with telehealth technology discussing the above problems.     Medication Adjustments/Labs and Tests Ordered: Current medicines are reviewed at length with the patient today.  Concerns regarding medicines are outlined above.   Tests Ordered: No orders of the defined types were placed in this encounter.   Medication Changes: No orders of the defined types were placed in this encounter.   Follow Up:  In Person Dr Oval Linsey 3-4 months  Signed, Kerin Ransom, PA-C  09/19/2019 11:16 AM    [place]

## 2019-09-20 ENCOUNTER — Other Ambulatory Visit: Payer: Self-pay

## 2019-09-20 MED ORDER — MONTELUKAST SODIUM 10 MG PO TABS: 10.0000 mg | ORAL_TABLET | Freq: Every day | ORAL | 11 refills | Status: DC

## 2019-09-21 ENCOUNTER — Telehealth: Payer: Self-pay

## 2019-09-21 LAB — BASIC METABOLIC PANEL
BUN/Creatinine Ratio: 35 — ABNORMAL HIGH (ref 9–23)
BUN: 32 mg/dL — ABNORMAL HIGH (ref 6–24)
CO2: 27 mmol/L (ref 20–29)
Calcium: 9.5 mg/dL (ref 8.7–10.2)
Chloride: 95 mmol/L — ABNORMAL LOW (ref 96–106)
Creatinine, Ser: 0.92 mg/dL (ref 0.57–1.00)
GFR calc Af Amer: 85 mL/min/{1.73_m2} (ref >59)
GFR calc non Af Amer: 73 mL/min/{1.73_m2} (ref >59)
Glucose: 185 mg/dL — ABNORMAL HIGH (ref 65–99)
Potassium: 3.7 mmol/L (ref 3.5–5.2)
Sodium: 140 mmol/L (ref 134–144)

## 2019-09-21 NOTE — Telephone Encounter (Signed)
PA request received from [place] requested: Arnell Sieving PA initiated via [employer] (pt has Medicaid) PA approved from 09/21/2019 - 09/15/2020 PA#: [id number]  Pharmacy aware.  Nothing further needed at this time- will close encounter.

## 2019-09-26 ENCOUNTER — Other Ambulatory Visit: Payer: Self-pay

## 2019-09-26 MED ORDER — FUROSEMIDE 80 MG PO TABS: ORAL_TABLET | ORAL | 2 refills | Status: DC

## 2019-10-19 ENCOUNTER — Ambulatory Visit: Payer: Medicaid Other | Admitting: Internal Medicine

## 2019-11-13 ENCOUNTER — Ambulatory Visit: Payer: Medicaid Other | Admitting: Registered"

## 2019-11-18 ENCOUNTER — Ambulatory Visit: Payer: Medicaid Other

## 2019-12-02 IMAGING — DX DG CHEST 2V
2 series · 2 of 2 positions shown · non-contrast
Comparison: PA and lateral chest x-ray June 04, 2017

CLINICAL DATA: Cough, chest congestion, shortness of breath, and
mid sternal chest pain for the past week. History of CHF, asthma,
current smoker.

EXAM:
CHEST  2 VIEW

[chest pa]
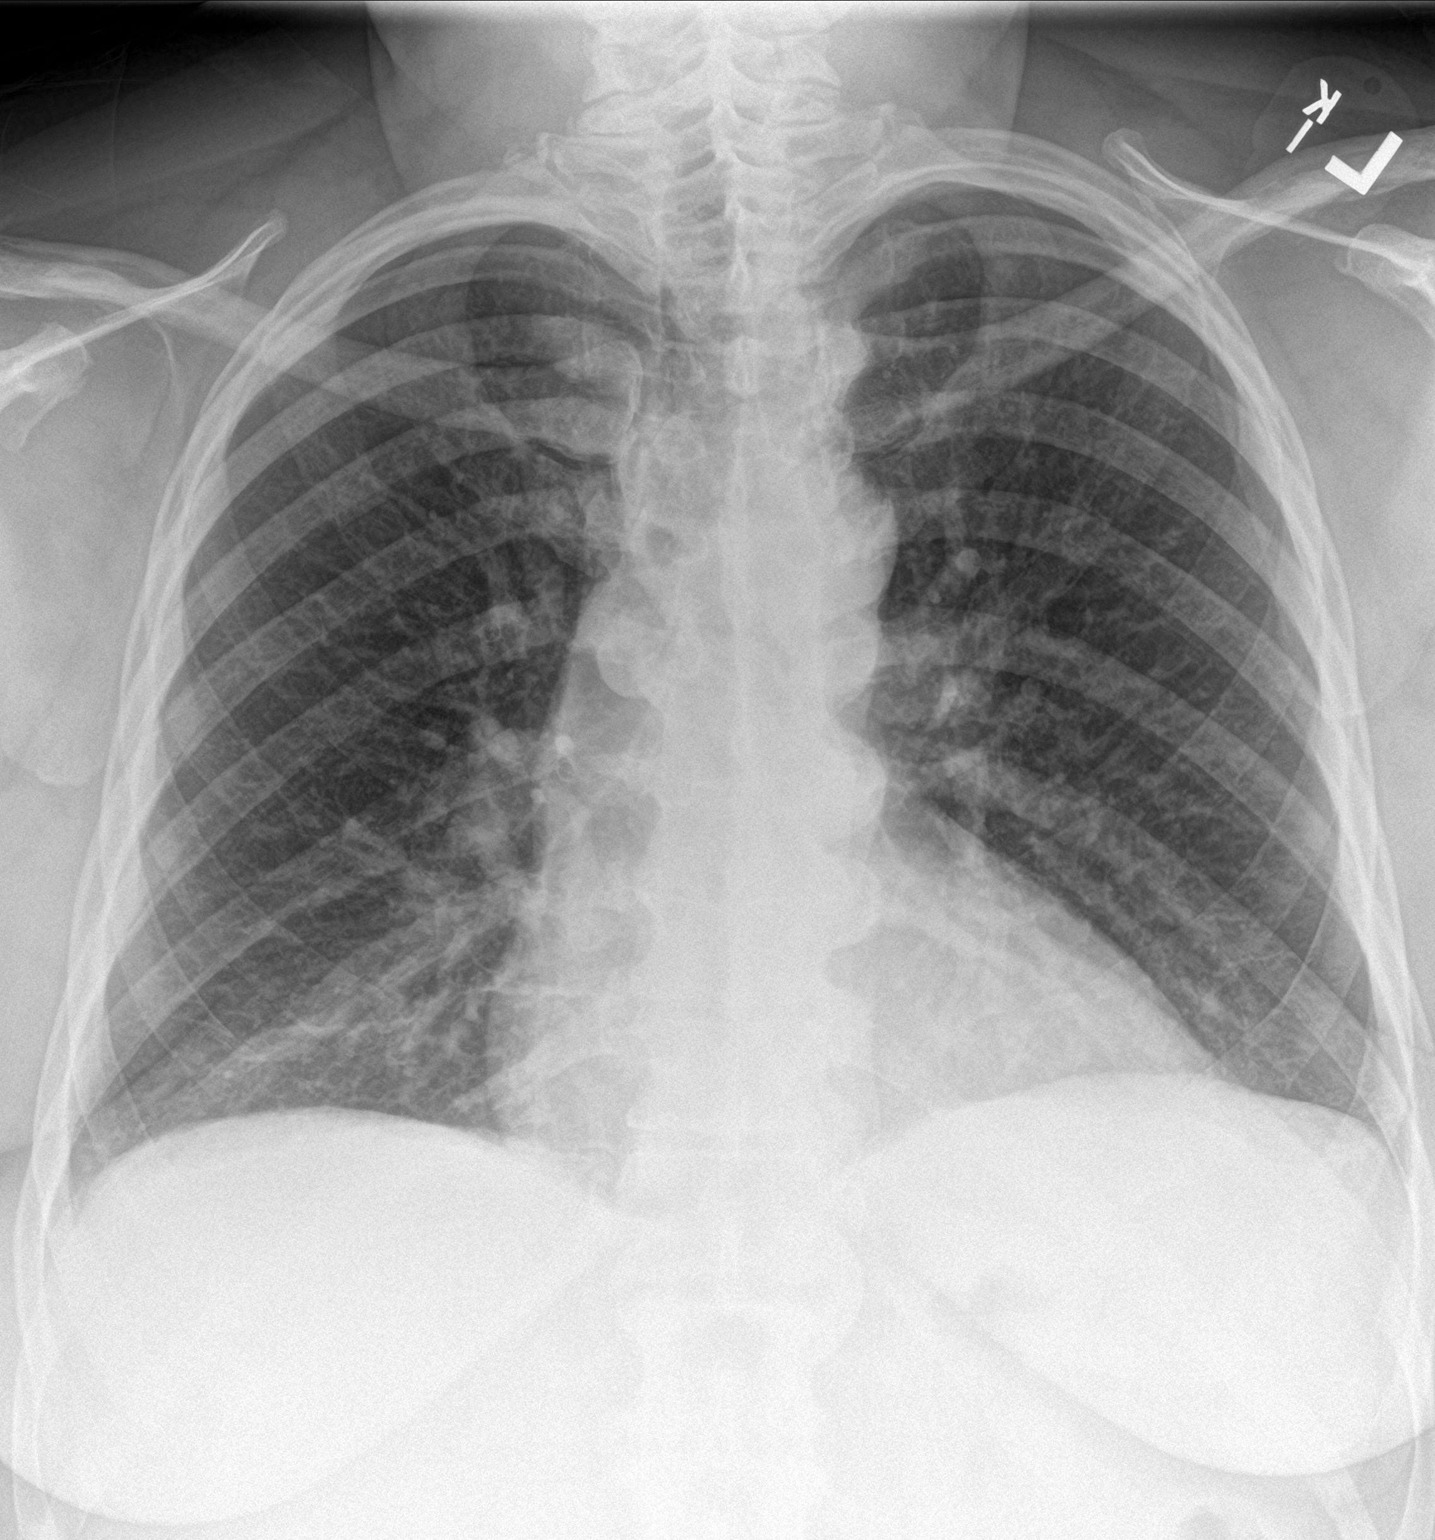

[chest lat]
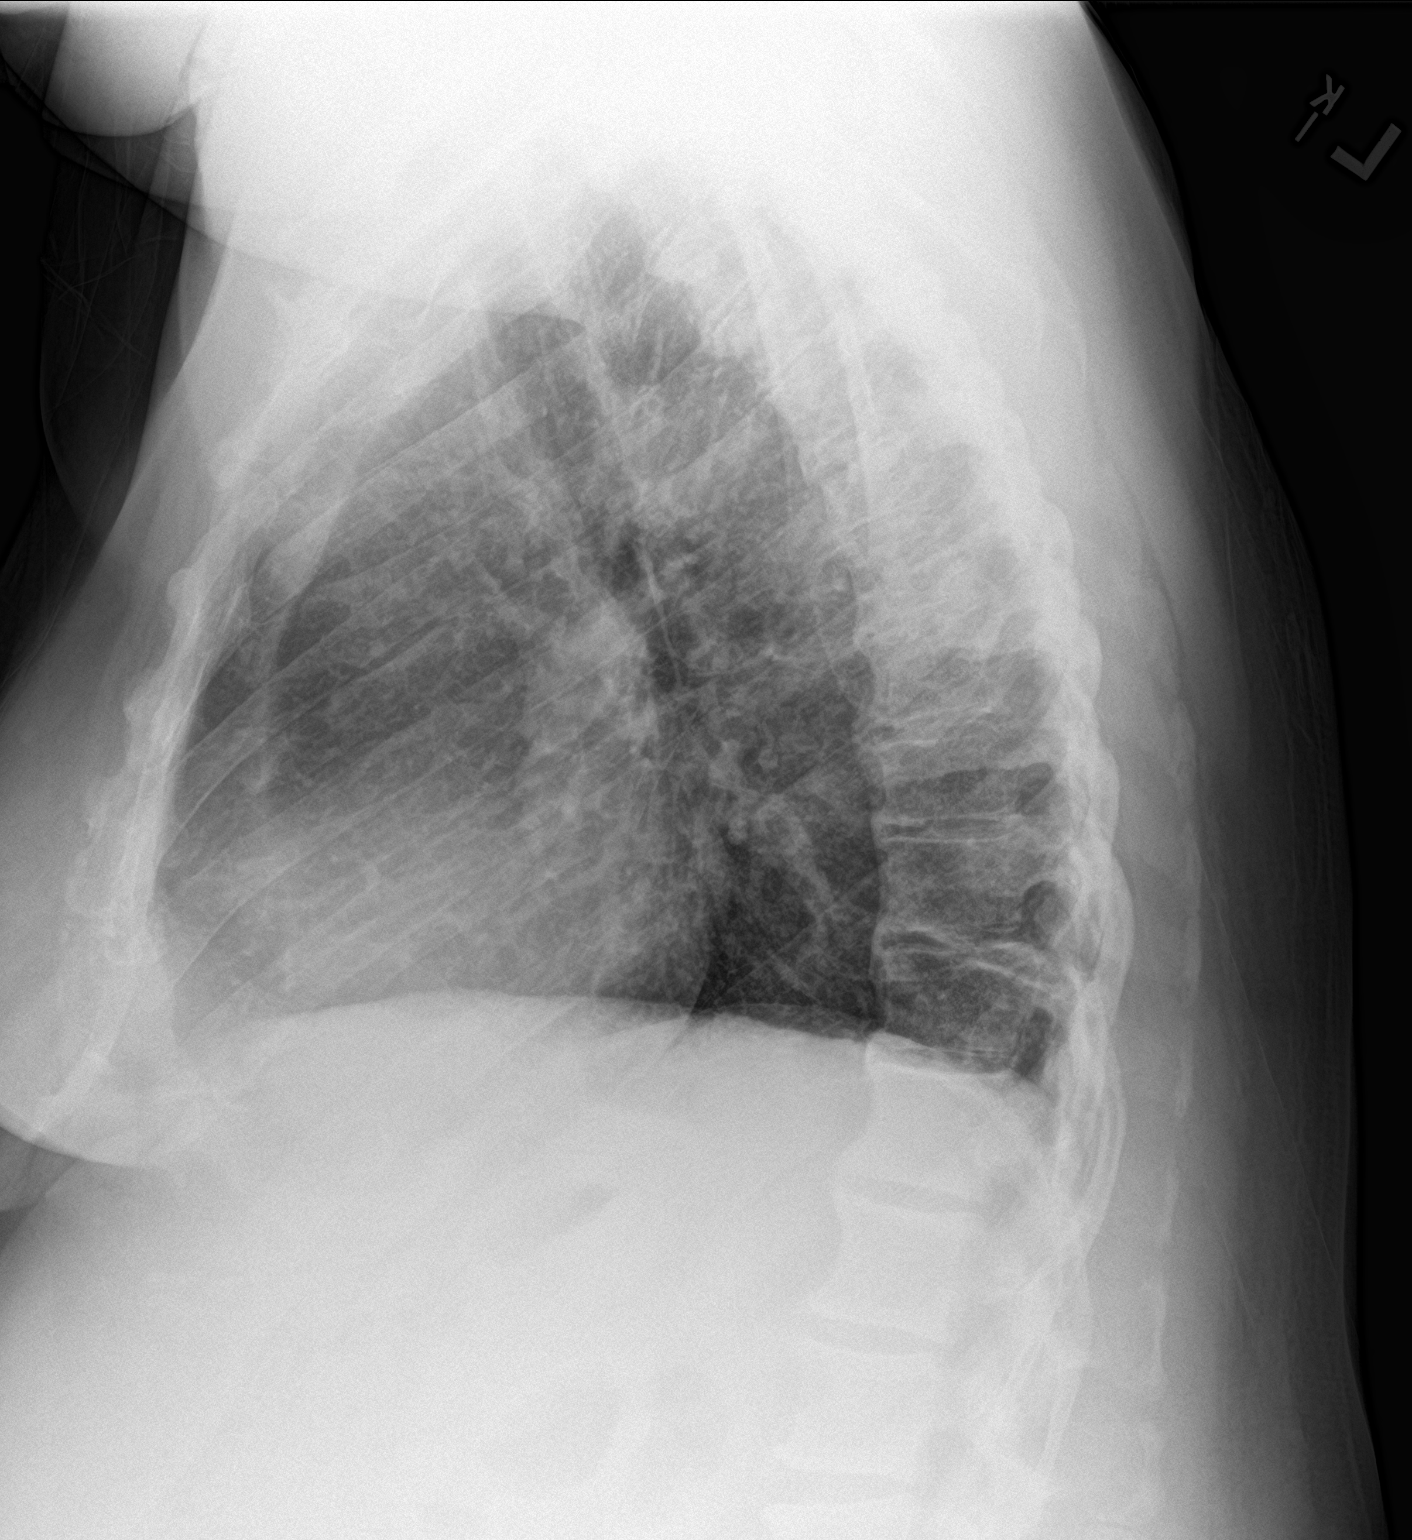

[2 of 2 positions shown; findings below may reference images not displayed]

FINDINGS: The lungs are well-expanded. The interstitial markings are coarse.
There is new linear increased density in the posterior aspect of the
right lower lobe. There is no alveolar infiltrate. The heart and
pulmonary vascularity are normal. There is multilevel degenerative
disc disease of the thoracic spine.
IMPRESSION: Chronic bronchitic-smoking related changes. New subsegmental
atelectasis or early pneumonia in the right lower lobe. Followup PA
and lateral chest X-ray is recommended in 3-4 weeks following trial
of antibiotic therapy to ensure resolution and exclude underlying
malignancy. No CHF.

## 2019-12-06 ENCOUNTER — Ambulatory Visit: Payer: Medicaid Other | Admitting: Registered"

## 2019-12-20 ENCOUNTER — Ambulatory Visit: Payer: Medicaid Other | Admitting: Cardiovascular Disease

## 2019-12-20 NOTE — Progress Notes (Deleted)
Cardiology Office Note   Date:  12/20/2019   ID:  VEVA GRIMLEY, DOB 1971/06/05, MRN [id number]  PCP:  [name], PA  Cardiologist:   [name], MD   No chief complaint on file.   Patient ID: NIKKIE LIMING is a 49 y.o. female with chronic diastolic heart failure, hypertensive heart disease, tongue cancer, OSA on CPAP, lymphedema, schizophrenia, bipolar disorder, obesity and prior PE who presents for follow up.  Ms. Ortloff was initially seen 05/2015 with fatigue and shortness of breath.  At the time she endorsed heart failure symptoms, including lower extremity edema, orthopnea and shortness of breath.  She also reported atypical chest pain.  After that appointment she was referred to the hospital and had a [place] 05/26/15 that was negative for ischemia and revealed LVEF 57%.  She had an echo 05/24/15 with LVEF 60-65% and grade 2 diastolic dysfunction.    Since her last appointment Ms. Bramblett has been doing well.  She was diagnosed with lymphedema and has been going to the clinic.  She was fitted for a pump to help with her swelling and is waiting to receive this in the mail.  She was also referred for water aerobics class.  However she will not be able to start this until her insurance changes in February.  Her exercise is limited by chronic back and knee pain.  She needs her right knee replaced but cannot do this until she loses weight and gets to 215 pounds.  She has been working on her diet.  She avoids fried and fatty foods.  She does not eat after 6 PM.  If she gets hungry after that she either drinks water or has fruit.  She has also cut bread out of her diet.  She is been able to lose 8 pounds in the last 2 months.  Her blood pressure has been well-controlled at home.  She is trying to cut back smoking and is not smoking 1/2 pack of cigarettes daily.  She saw Kerin Ransom, PA-C on 08/2019 and reported increased shortness of breath.  BNP at that  time was 38.  She was treated for acute bronchitis with azithromycin.  She was ultimately admitted to the hospital.  There is strong suspicion that she had Covid despite the fact that the test was negative.  She was started on steroids and another round of azithromycin.  She was diuresed, although BNP was within normal limits.  Her discharge weight was 277 pounds, unchanged from prior to admission.  In the hospital amlodipine was held and then resumed at discharge.  Losartan was discontinued.  When she followed up with Lurena Joiner on 12/18 her blood pressure was 200/100 and then 170/100 on recheck.  He resumed Diovan at 160 mg   Past Medical History:  Diagnosis Date  . Abdominal hernia   . Anginal pain ([place])    occ from asthma  . Anxiety   . Arthritis   . Asthma   . Bipolar affective disorder ([place])    Schizophrenia  . Bronchitis    hx of  . CHF (congestive heart failure) ([place])   . Depression   . Diabetes mellitus without complication ([place])    " New Onset " per patient  . GERD (gastroesophageal reflux disease)   . Heart murmur   . Hypertension    takes meds  . Lymphedema 06/07/2018  . Morbid (severe) obesity due to excess calories ([place])   . Neuropathy  left leg , "from back surgery"  . Overactive bladder   . Schizophrenia ([place])   . Sciatica   . Shortness of breath   . Sleep apnea   . Snoring disorder    Pt stated my boyfriend always wakes me up and tells me to breathe    Past Surgical History:  Procedure Laterality Date  . BACK SURGERY     3 back surgeries  . CHOLECYSTECTOMY    . COLONOSCOPY WITH PROPOFOL N/A 01/23/2016   Procedure: COLONOSCOPY WITH PROPOFOL;  Surgeon: Wonda Horner, MD;  Location: [facility];  Service: Endoscopy;  Laterality: N/A;  . EYE SURGERY     Metal plate in right eye. Had fracture in right eye  . gallstones reomved    . KNEE ARTHROSCOPY WITH MENISCAL REPAIR Right 09/28/2016   Procedure: KNEE ARTHROSCOPY WITH MENISCAL REPAIR;  Surgeon: Dorna Leitz, MD;   Location: [facility];  Service: Orthopedics;  Laterality: Right;  Right partial meniscectomy and chondroplasty, patellar/femoral joint and medial femoral condyle   . LUMBAR LAMINECTOMY/DECOMPRESSION MICRODISCECTOMY  04/01/2012   Procedure: LUMBAR LAMINECTOMY/DECOMPRESSION MICRODISCECTOMY 2 LEVELS;  Surgeon: Faythe Ghee, MD;  Location: [facility];  Service: Neurosurgery;  Laterality: Left;  Lumbar four-five, lumbar five sacral one microdiscectomy   . LUMBAR WOUND DEBRIDEMENT  04/29/2012   Procedure: LUMBAR WOUND DEBRIDEMENT;  Surgeon: Faythe Ghee, MD;  Location: [facility];  Service: Neurosurgery;  Laterality: N/A;  lumbar wound debridement  . ROTATOR CUFF REPAIR     Right shoulder  . TUBAL LIGATION       Current Outpatient Medications  Medication Sig Dispense Refill  . albuterol (PROAIR HFA) 108 (90 Base) MCG/ACT inhaler Inhale 2 puffs into the lungs every 4 (four) hours as needed for wheezing or shortness of breath.    [name] albuterol (PROVENTIL) (2.5 MG/3ML) 0.083% nebulizer solution Take 2.5 mg by nebulization every 4 (four) hours as needed for wheezing or shortness of breath.    [name] amLODipine (NORVASC) 10 MG tablet Take 1 tablet (10 mg total) by mouth daily. 90 tablet 2  . ARIPiprazole (ABILIFY) 20 MG tablet Take 1 tablet (20 mg total) by mouth at bedtime. 30 tablet 0  . azithromycin (ZITHROMAX) 500 MG tablet Take 1 tablet (500 mg total) by mouth daily. 3 tablet 0  . bisoprolol (ZEBETA) 5 MG tablet One twice daily (Patient taking differently: Take 5 mg by mouth 2 (two) times daily. ) 60 tablet 11  . blood glucose meter kit and supplies KIT Dispense based on patient and insurance preference. Use up to four times daily as directed. (FOR ICD-9 250.00, 250.01). 1 each 0  . Budeson-Glycopyrrol-Formoterol (BREZTRI AEROSPHERE) 160-9-4.8 MCG/ACT AERO Inhale 2 puffs into the lungs 2 (two) times daily. 10.7 g 11  . diclofenac Sodium (VOLTAREN) 1 % GEL Apply 1 application topically 2 (two) times  daily. For knee and back pain    . DULoxetine (CYMBALTA) 30 MG capsule Take 30 mg by mouth 2 (two) times daily.    . furosemide (LASIX) 80 MG tablet Take 86m in the morning and 433min the evening. 180 tablet 2  . gabapentin (NEURONTIN) 300 MG capsule Take 300 mg by mouth 3 (three) times daily.     . Marland Kitchenlimepiride (AMARYL) 1 MG tablet Take 1 tablet (1 mg total) by mouth daily with breakfast. 30 tablet 3  . HYDROcodone-acetaminophen (NORCO/VICODIN) 5-325 MG tablet Take 1-2 tablets by mouth See admin instructions. Take 2 tablets by mouth daily at bedtime, may  also take 1-2 tablets every 4 hours as needed for pain    . insulin aspart protamine - aspart (NOVOLOG 70/30 MIX) (70-30) 100 UNIT/ML FlexPen Inject 0.18 mLs (18 Units total) into the skin 2 (two) times daily with a meal. (Patient taking differently: Inject 18 Units into the skin 3 (three) times daily with meals. ) 15 mL 11  . Insulin Pen Needle (NOVOFINE) 30G X 8 MM MISC Inject 10 each into the skin as needed. 100 each 3  . meloxicam (MOBIC) 15 MG tablet Take 1 tablet (15 mg total) by mouth daily as needed for pain. (Patient taking differently: Take 15 mg by mouth daily. ) 10 tablet 0  . methocarbamol (ROBAXIN) 500 MG tablet Take 500 mg by mouth 2 (two) times daily.     . montelukast (SINGULAIR) 10 MG tablet Take 1 tablet (10 mg total) by mouth at bedtime. 30 tablet 11  . omeprazole (PRILOSEC) 40 MG capsule Take 1 capsule (40 mg total) by mouth daily. 90 capsule 0  . potassium chloride SA (K-DUR,KLOR-CON) 20 MEQ tablet Take 40 mEq by mouth 2 (two) times daily.     . predniSONE (DELTASONE) 10 MG tablet Take  4 each am x 2 days,   2 each am x 2 days,  1 each am x 2 days and stop 14 tablet 0  . PRESCRIPTION MEDICATION Inhale into the lungs at bedtime. CPAP    . Respiratory Therapy Supplies (FLUTTER) DEVI Use flutter device 3 times a day 1 each 0  . Triamcinolone Acetonide 0.025 % LOTN Apply 1 application topically 3 (three) times daily.    .  valsartan (DIOVAN) 160 MG tablet Take 1 tablet (160 mg total) by mouth daily. 21 tablet 0   No current facility-administered medications for this visit.    Allergies:   Aspirin    Social History:  The patient  reports that she has been smoking cigarettes. She has a 24.00 pack-year smoking history. She has never used smokeless tobacco. She reports that she does not drink alcohol or use drugs.   Family History:  The patient's family history includes Cancer in her paternal aunt; Diabetes in her father and mother; Heart disease in her paternal aunt; Hypertension in her mother.    ROS:  Please see the history of present illness.   Otherwise, review of systems are positive for none.   All other systems are reviewed and negative.    PHYSICAL EXAM: VS:  LMP 10/15/2018 Comment: patient had pregnancy test with PCP (test was negative) , BMI There is no height or weight on file to calculate BMI. GENERAL:  Well appearing HEENT: Pupils equal round and reactive, fundi not visualized, oral mucosa unremarkable NECK:  No jugular venous distention, waveform within normal limits, carotid upstroke brisk and symmetric, no bruits LYMPHATICS: UE and LE lymphedema LUNGS:  Clear to auscultation bilaterally HEART:  RRR.  PMI not displaced or sustained,S1 and S2 within normal limits, no S3, no S4, no clicks, no rubs, no murmurs ABD:  Flat, positive bowel sounds normal in frequency in pitch, no bruits, no rebound, no guarding, no midline pulsatile mass, no hepatomegaly, no splenomegaly EXT:  2 plus pulses throughout, bilateral LU/UE non-pitting edema, no cyanosis no clubbing SKIN:  No rashes no nodules NEURO:  Cranial nerves II through XII grossly intact, motor grossly intact throughout PSYCH:  Cognitively intact, oriented to person place and time  EKG:  EKG is not ordered today. The ekg ordered 09/18/16 demonstrates sinus rhythm. Rate  77 bpm.  Non-specific T wave changes.    Echo 05/24/15: Study Conclusions  -  Left ventricle: The cavity size was normal. Wall thickness was  increased in a pattern of mild LVH. Systolic function was normal.  The estimated ejection fraction was in the range of 60% to 65%.  Wall motion was normal; there were no regional wall motion  abnormalities. Features are consistent with a pseudonormal left  ventricular filling pattern, with concomitant abnormal relaxation  and increased filling pressure (grade 2 diastolic dysfunction).  Lexiscan Myoview 05/26/15: IMPRESSION: 1. No reversible ischemia or infarction.  2. Mild septal hypokinesis.  3. Left ventricular ejection fraction 57%  4. Low-risk stress test findings*.   Recent Labs: 08/16/2019: NT-Pro BNP 38 08/28/2019: B Natriuretic Peptide 26.9 08/29/2019: TSH 0.331 08/30/2019: ALT 39; Hemoglobin 13.5; Magnesium 2.1; Platelets 219 09/21/2019: BUN 32; Creatinine, Ser 0.92; Potassium 3.7; Sodium 140    Lipid Panel    Component Value Date/Time   CHOL 148 06/07/2018 1024   TRIG 163 (H) 08/28/2019 1100   HDL 51 06/07/2018 1024   CHOLHDL 2.9 06/07/2018 1024   CHOLHDL 2.7 05/25/2015 0333   VLDL 21 05/25/2015 0333   LDLCALC 76 06/07/2018 1024      Wt Readings from Last 3 Encounters:  09/19/19 280 lb (127 kg)  09/18/19 280 lb 12.8 oz (127.4 kg)  09/01/19 277 lb (125.6 kg)     Other studies Reviewed: Additional studies/ records that were reviewed today include: . Review of the above records demonstrates:  Please see elsewhere in the note.     ASSESSMENT AND PLAN:  # Hypertensive heart disease:  Blood pressure is much better controlled.  Continue amlodipine, Lasix, HCTZ/triamterene, losartan, and carvedilol.  # Chronic diastolic heart failure:  # Obesity: # Shortness of breath: # Asthma/COPD: Stable.  Ms. Distel has significant swelling.  However it is non-pitting.  BNP has repeatedly been within normal limits.  Her swelling is attributable to lymphedema and she is awaiting compression  devices.    # Chest pain: Resolved.   She had a negative stress test 05/2015.  # Tobacco abuse: Encouraged continued smoking cessation.  # Morbid Obesity: Exercise at least 150 minutes weekly.  She will start an exercise program once her insurance allows.  # CV Disease Prevention: Check fasting lipids and LFTs.   Current medicines are reviewed at length with the patient today.  The patient does not have concerns regarding medicines.  The following changes have been made:  none  Labs/ tests ordered today include:    No orders of the defined types were placed in this encounter.    Disposition:  FU with Dr. Jonelle Sidle C. Oval Linsey in 1 year.  APP in 6 months.    Signed, [name], MD  12/20/2019 8:06 AM    [place]

## 2019-12-30 IMAGING — US US PELVIS COMPLETE TRANSABD/TRANSVAG
1 series · 15 of 25 positions shown · non-contrast
Comparison: None

CLINICAL DATA: Abnormal uterine bleeding. Pelvic pain and cramping.

EXAM:
TRANSABDOMINAL AND TRANSVAGINAL ULTRASOUND OF PELVIS
TECHNIQUE: Both transabdominal and transvaginal ultrasound examinations of the
pelvis were performed. Transabdominal technique was performed for
global imaging of the pelvis including uterus, ovaries, adnexal
regions, and pelvic cul-de-sac. It was necessary to proceed with
endovaginal exam following the transabdominal exam to visualize the
endometrial stripe and ovaries.

[Series 1: us pelvis complete transabd/transvag · 15 of 64 slices shown]
[im 1/64]
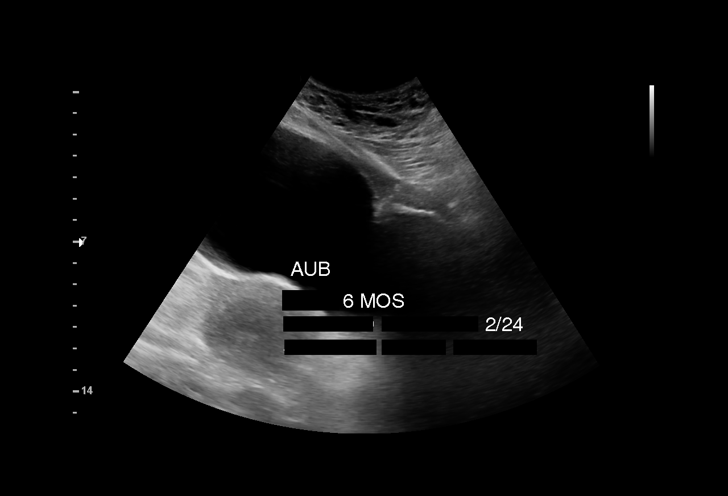
[im 6/64]
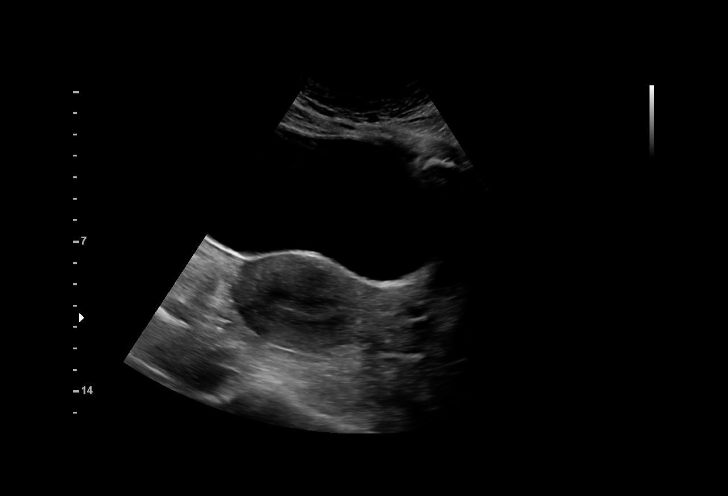
[im 11/64]
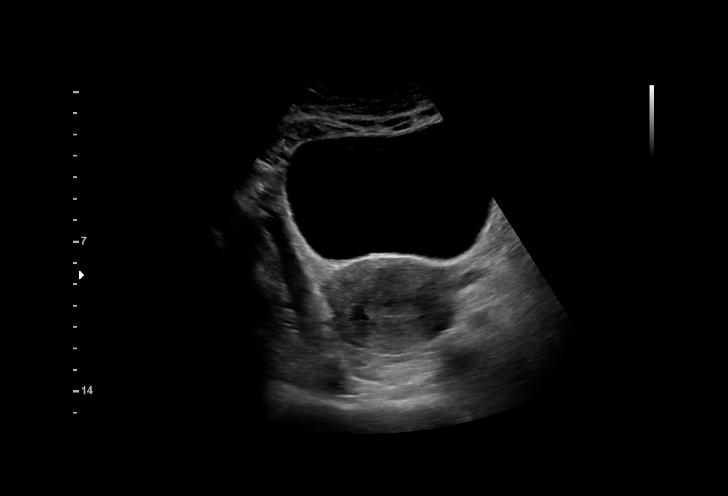
[im 14/64]
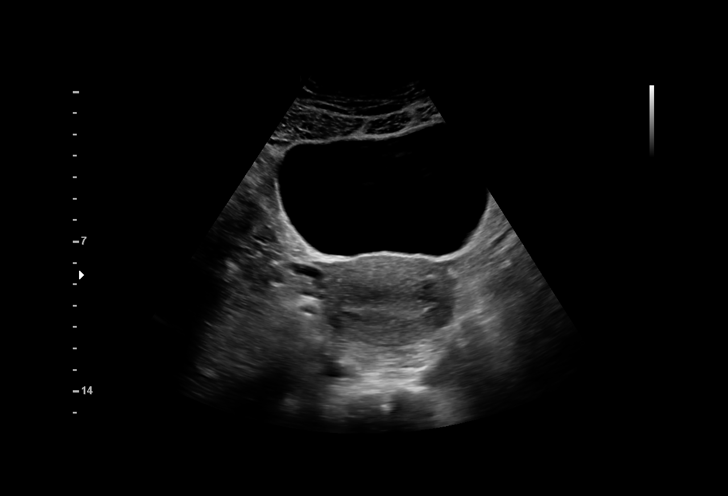
[im 19/64]
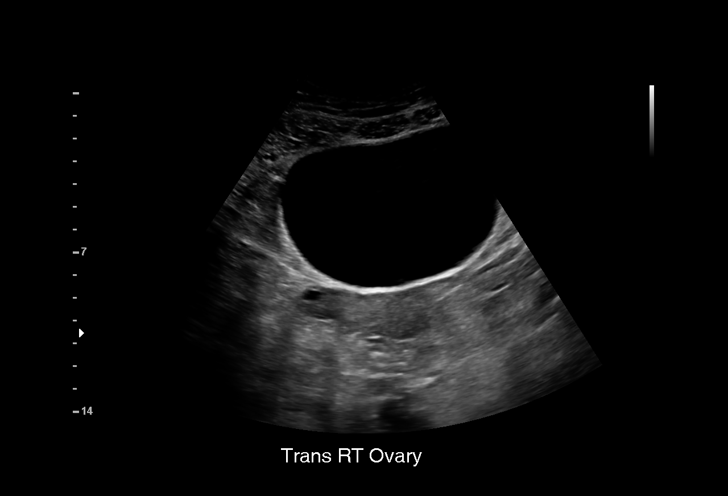
[im 24/64]
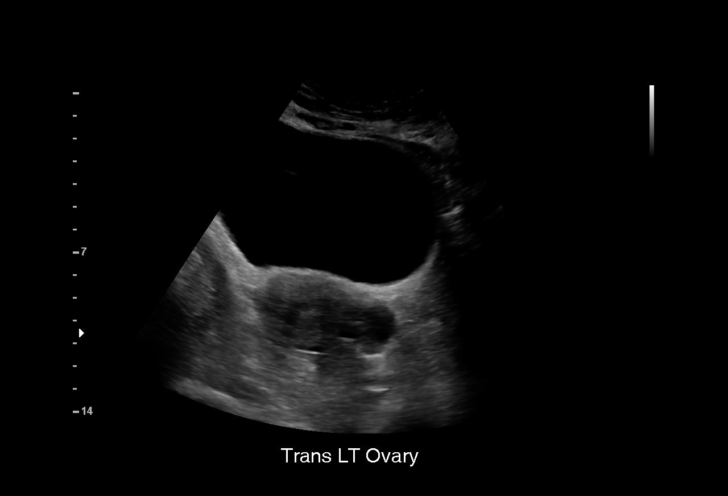
[im 27/64]
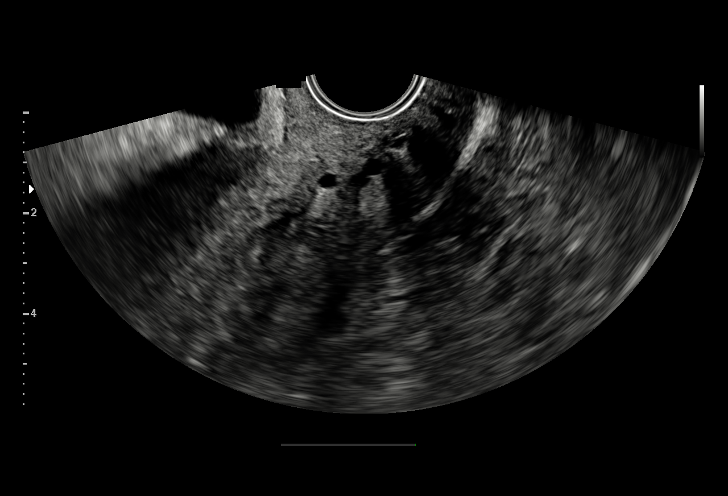
[im 32/64]
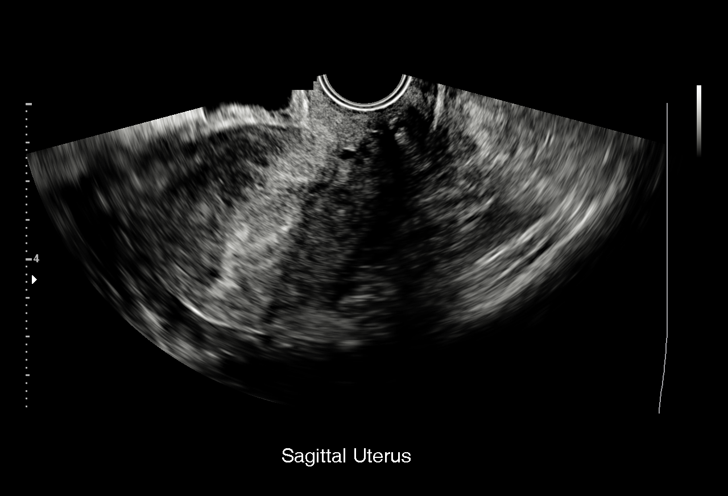
[im 37/64]
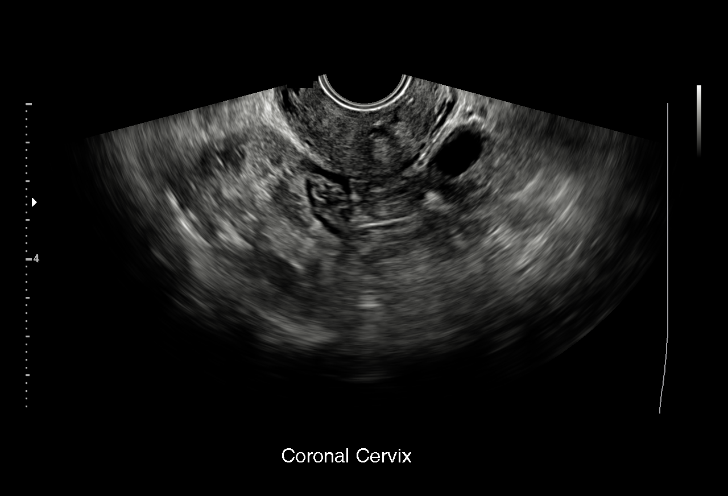
[im 40/64]
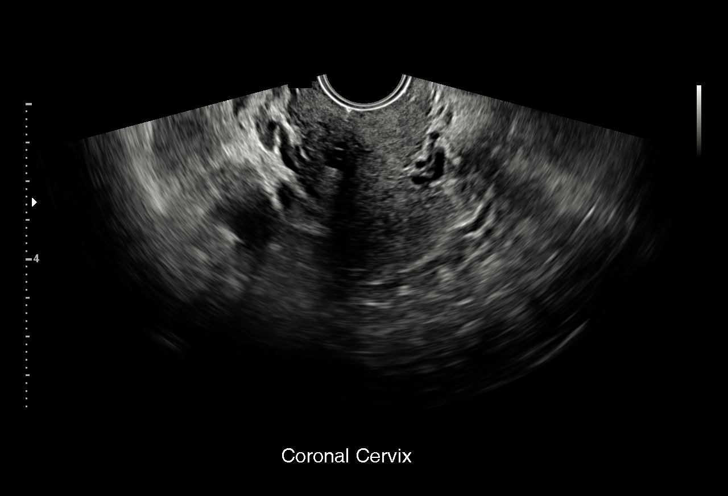
[im 45/64]
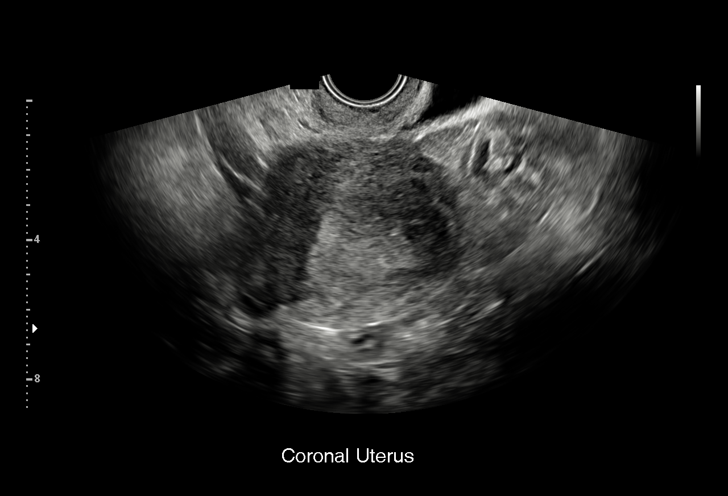
[im 50/64]
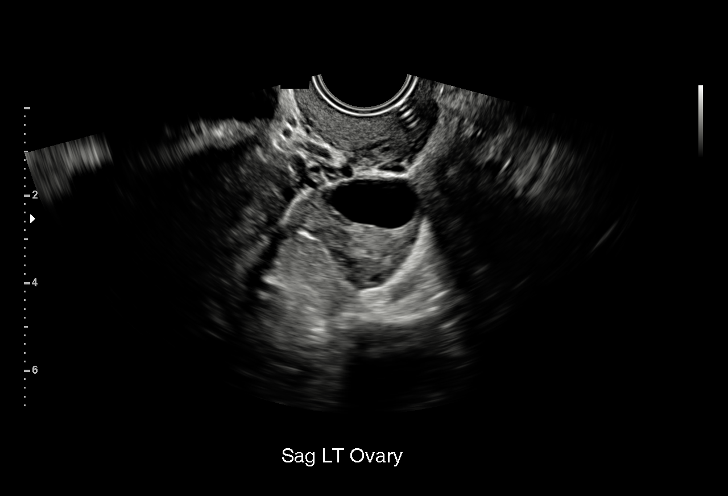
[im 53/64]
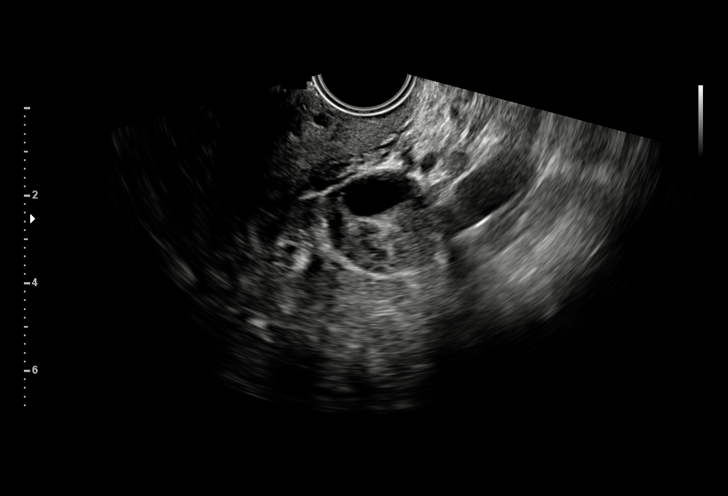
[im 58/64]
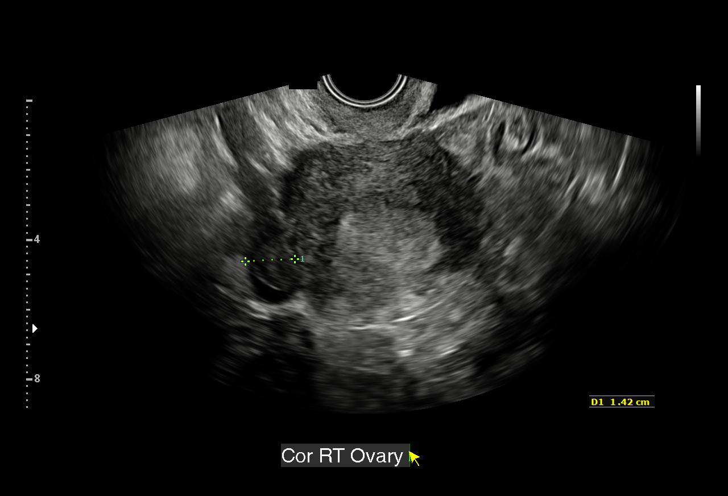
[im 64/64]
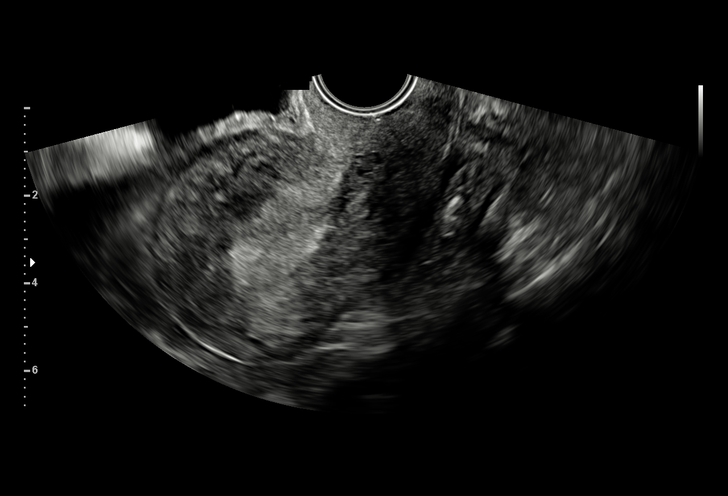

[15 of 25 positions shown; findings below may reference images not displayed]

FINDINGS: Uterus

Measurements: 10.1 x 4.6 x 6.5 cm. No fibroids or other mass
visualized.

Endometrium

Thickness: 16 mm.  No focal abnormality visualized.

Right ovary

Measurements: 2.6 x 1.6 x 1.4 cm. Normal appearance/no adnexal mass.

Left ovary

Measurements: 2.8 x 2.3 x 2.4 cm. Normal appearance/no adnexal mass.

Other findings

No abnormal free fluid.
IMPRESSION: No evidence of uterine fibroids. Endometrial thickness measures 16
mm. If bleeding remains unresponsive to hormonal or medical therapy,
focal lesion work-up with sonohysterogram should be considered.
Endometrial biopsy should also be considered in pre-menopausal
patients at high risk for endometrial carcinoma. (Ref: Radiological
Reasoning: Algorithmic Workup of Abnormal Vaginal Bleeding with
Endovaginal Sonography and Sonohysterography. AJR [id number]; [id number]).

Normal appearance of both ovaries.  No adnexal mass identified.

## 2020-01-23 ENCOUNTER — Telehealth (INDEPENDENT_AMBULATORY_CARE_PROVIDER_SITE_OTHER): Payer: Medicaid Other | Admitting: Cardiology

## 2020-01-23 ENCOUNTER — Encounter: Payer: Self-pay | Admitting: Cardiology

## 2020-01-23 VITALS — Ht 62.0 in

## 2020-01-23 DIAGNOSIS — C029 Malignant neoplasm of tongue, unspecified: Secondary | ICD-10-CM

## 2020-01-23 DIAGNOSIS — I10 Essential (primary) hypertension: Secondary | ICD-10-CM

## 2020-01-23 DIAGNOSIS — G4733 Obstructive sleep apnea (adult) (pediatric): Secondary | ICD-10-CM

## 2020-01-23 DIAGNOSIS — I5032 Chronic diastolic (congestive) heart failure: Secondary | ICD-10-CM | POA: Diagnosis not present

## 2020-01-23 DIAGNOSIS — F3170 Bipolar disorder, currently in remission, most recent episode unspecified: Secondary | ICD-10-CM

## 2020-01-23 DIAGNOSIS — F17210 Nicotine dependence, cigarettes, uncomplicated: Secondary | ICD-10-CM

## 2020-01-23 DIAGNOSIS — J449 Chronic obstructive pulmonary disease, unspecified: Secondary | ICD-10-CM | POA: Diagnosis not present

## 2020-01-23 MED ORDER — BISOPROLOL FUMARATE 5 MG PO TABS: ORAL_TABLET | ORAL | 3 refills | Status: DC

## 2020-01-23 NOTE — Patient Instructions (Signed)
Medication Instructions:  Your physician recommends that you continue on your current medications as directed. Please refer to the Current Medication list given to you today.  We are starting a Prior Authorization for your Bisoprolol.  *If you need a refill on your cardiac medications before your next appointment, please call your pharmacy*   Follow-Up: At [facility], you and your health needs are our priority.  As part of our continuing mission to provide you with exceptional heart care, we have created designated Provider Care Teams.  These Care Teams include your primary Cardiologist (physician) and Advanced Practice Providers (APPs -  Physician Assistants and Nurse Practitioners) who all work together to provide you with the care you need, when you need it.  We recommend signing up for the patient portal called "MyChart".  Sign up information is provided on this After Visit Summary.  MyChart is used to connect with patients for Virtual Visits (Telemedicine).  Patients are able to view lab/test results, encounter notes, upcoming appointments, etc.  Non-urgent messages can be sent to your provider as well.   To learn more about what you can do with MyChart, go to [email or web address].    Your next appointment:   6 month(s)  The format for your next appointment:   In Person  Provider:   [name], MD   Other Instructions Please call our office 2 months in advance to schedule your follow-up appointment with Dr. Oval Linsey.

## 2020-01-23 NOTE — Progress Notes (Signed)
Virtual Visit via Telephone Note   This visit type was conducted due to national recommendations for restrictions regarding the COVID-19 Pandemic (e.g. social distancing) in an effort to limit this patient's exposure and mitigate transmission in our community.  Due to her co-morbid illnesses, this patient is at least at moderate risk for complications without adequate follow up.  This format is felt to be most appropriate for this patient at this time.  The patient did not have access to video technology/had technical difficulties with video requiring transitioning to audio format only (telephone).  All issues noted in this document were discussed and addressed.  No physical exam could be performed with this format.  Please refer to the patient's chart for her  consent to telehealth for [facility].   The patient was identified using 2 identifiers.  Date:  01/23/2020   ID:  Lori Bean, DOB 1970-11-19, MRN [id number]  Patient Location: Home Provider Location: Home  PCP:  [name], PA  Cardiologist:  [name], MD  Electrophysiologist:  None   Evaluation Performed:  Follow-Up Visit  Chief Complaint:  none  History of Present Illness:    Lori Bean is a 49 y.o. female with a history of chronic diastolic heart failure, (though her most recent echo from August 2019 showed an EF of 60 to 65% with mild LVH and no significant diastolic dysfunction). She has super morbid obesity with a BMI of 50. She has sleep apnea and is on CPAP. She has asthmatic COPD and is a smoker. Dr. [name] follows her. She has chronic lymphedema and is on high-dose diuretics. In February 2020 she was diagnosed with tongue cancer, she is followed by Dr. Tamela Oddi at [facility].  She was seen in the office 08/16/2019 with complaints of increasing shortness of breath and a productive cough with thick sputum. She denied orthopnea. At that time it was felt she most likely had  acute bronchitis and she was prescribed a Z-Pak. Initially she had some improvement but her symptoms recurred and she ended up admitted through the emergency room 08/28/2019. There was a high suspicion that she had COVID. She was actually tested 3 times during that admission and all were negative. She was put on a steroid Dosepak and another round of azithromycin. Her Lasix 80 mg twice daily home dose was changed to Lasix 60 mg IV twice daily while she was hospitalized in the event that she had superimposed diastolic heart failure. During her hospitalization [name] was low and her amlodipine was initially but later came back and her medications were resumed at follow up Jan 2021.   She was contacted today for follow up.  She has done well since we contacted her last.  She denies increased SOB.  She did run out of her Bisoprolol a month or two ago and hasn't been able to get if refilled (needs prior [place].).    The patient does not have symptoms concerning for COVID-19 infection (fever, chills, cough, or new shortness of breath).  She is fully vaccinated.    Past Medical History:  Diagnosis Date  . Abdominal hernia   . Anginal pain ([place])    occ from asthma  . Anxiety   . Arthritis   . Asthma   . Bipolar affective disorder ([place])    Schizophrenia  . Bronchitis    hx of  . CHF (congestive heart failure) ([place])   . Depression   . Diabetes mellitus without complication ([place])    "  New Onset " per patient  . GERD (gastroesophageal reflux disease)   . Heart murmur   . Hypertension    takes meds  . Lymphedema 06/07/2018  . Morbid (severe) obesity due to excess calories ([place])   . Neuropathy    left leg , "from back surgery"  . Overactive bladder   . Schizophrenia ([place])   . Sciatica   . Shortness of breath   . Sleep apnea   . Snoring disorder    Pt stated my boyfriend always wakes me up and tells me to breathe   Past Surgical History:  Procedure Laterality Date  . BACK SURGERY     3  back surgeries  . CHOLECYSTECTOMY    . COLONOSCOPY WITH PROPOFOL N/A 01/23/2016   Procedure: COLONOSCOPY WITH PROPOFOL;  Surgeon: Wonda Horner, MD;  Location: [facility];  Service: Endoscopy;  Laterality: N/A;  . EYE SURGERY     Metal plate in right eye. Had fracture in right eye  . gallstones reomved    . KNEE ARTHROSCOPY WITH MENISCAL REPAIR Right 09/28/2016   Procedure: KNEE ARTHROSCOPY WITH MENISCAL REPAIR;  Surgeon: Dorna Leitz, MD;  Location: [place];  Service: Orthopedics;  Laterality: Right;  Right partial meniscectomy and chondroplasty, patellar/femoral joint and medial femoral condyle   . LUMBAR LAMINECTOMY/DECOMPRESSION MICRODISCECTOMY  04/01/2012   Procedure: LUMBAR LAMINECTOMY/DECOMPRESSION MICRODISCECTOMY 2 LEVELS;  Surgeon: Faythe Ghee, MD;  Location: [facility];  Service: Neurosurgery;  Laterality: Left;  Lumbar four-five, lumbar five sacral one microdiscectomy   . LUMBAR WOUND DEBRIDEMENT  04/29/2012   Procedure: LUMBAR WOUND DEBRIDEMENT;  Surgeon: Faythe Ghee, MD;  Location: [facility];  Service: Neurosurgery;  Laterality: N/A;  lumbar wound debridement  . ROTATOR CUFF REPAIR     Right shoulder  . TUBAL LIGATION       Current Meds  Medication Sig  . albuterol (PROAIR HFA) 108 (90 Base) MCG/ACT inhaler Inhale 2 puffs into the lungs every 4 (four) hours as needed for wheezing or shortness of breath.  [name] albuterol (PROVENTIL) (2.5 MG/3ML) 0.083% nebulizer solution Take 2.5 mg by nebulization every 4 (four) hours as needed for wheezing or shortness of breath.  [name] amLODipine (NORVASC) 10 MG tablet Take 1 tablet (10 mg total) by mouth daily.  . ARIPiprazole (ABILIFY) 20 MG tablet Take 1 tablet (20 mg total) by mouth at bedtime.  [name] azithromycin (ZITHROMAX) 500 MG tablet Take 1 tablet (500 mg total) by mouth daily.  . blood glucose meter kit and supplies KIT Dispense based on patient and insurance preference. Use up to four times daily as directed. (FOR ICD-9 250.00,  250.01).  . Budeson-Glycopyrrol-Formoterol (BREZTRI AEROSPHERE) 160-9-4.8 MCG/ACT AERO Inhale 2 puffs into the lungs 2 (two) times daily.  . diclofenac Sodium (VOLTAREN) 1 % GEL Apply 1 application topically 2 (two) times daily. For knee and back pain  . DULoxetine (CYMBALTA) 30 MG capsule Take 30 mg by mouth 2 (two) times daily.  . furosemide (LASIX) 80 MG tablet Take '80mg'$  in the morning and '40mg'$  in the evening.  . gabapentin (NEURONTIN) 300 MG capsule Take 300 mg by mouth 3 (three) times daily.   [name] glimepiride (AMARYL) 1 MG tablet Take 1 tablet (1 mg total) by mouth daily with breakfast.  . HYDROcodone-acetaminophen (NORCO/VICODIN) 5-325 MG tablet Take 1-2 tablets by mouth See admin instructions. Take 2 tablets by mouth daily at bedtime, may also take 1-2 tablets every 4 hours as needed for pain  . insulin aspart  protamine - aspart (NOVOLOG 70/30 MIX) (70-30) 100 UNIT/ML FlexPen Inject 0.18 mLs (18 Units total) into the skin 2 (two) times daily with a meal. (Patient taking differently: Inject 18 Units into the skin 3 (three) times daily with meals. )  . Insulin Pen Needle (NOVOFINE) 30G X 8 MM MISC Inject 10 each into the skin as needed.  . meloxicam (MOBIC) 15 MG tablet Take 1 tablet (15 mg total) by mouth daily as needed for pain. (Patient taking differently: Take 15 mg by mouth daily. )  . methocarbamol (ROBAXIN) 500 MG tablet Take 500 mg by mouth 2 (two) times daily.   . montelukast (SINGULAIR) 10 MG tablet Take 1 tablet (10 mg total) by mouth at bedtime.  [name] omeprazole (PRILOSEC) 40 MG capsule Take 1 capsule (40 mg total) by mouth daily.  . potassium chloride SA (K-DUR,KLOR-CON) 20 MEQ tablet Take 40 mEq by mouth 2 (two) times daily.   . predniSONE (DELTASONE) 10 MG tablet Take  4 each am x 2 days,   2 each am x 2 days,  1 each am x 2 days and stop  . PRESCRIPTION MEDICATION Inhale into the lungs at bedtime. CPAP  . Respiratory Therapy Supplies (FLUTTER) DEVI Use flutter device 3 times a day    . Triamcinolone Acetonide 0.025 % LOTN Apply 1 application topically 3 (three) times daily.  . valsartan (DIOVAN) 160 MG tablet Take 1 tablet (160 mg total) by mouth daily.     Allergies:   Aspirin   Social History   Tobacco Use  . Smoking status: Current Every Day Smoker    Packs/day: 1.00    Years: 24.00    Pack years: 24.00    Types: Cigarettes  . Smokeless tobacco: Never Used  Substance Use Topics  . Alcohol use: No    Alcohol/week: 0.0 standard drinks    Comment: quit Nov. 2014  . Drug use: No     Family Hx: The patient's family history includes Cancer in her paternal aunt; Diabetes in her father and mother; Heart disease in her paternal aunt; Hypertension in her mother.  ROS:   Please see the history of present illness.    All other systems reviewed and are negative.   Prior CV studies:   The following studies were reviewed today:  Echo Aug 2019  Labs/Other Tests and Data Reviewed:    EKG:  An ECG dated 08/28/2019 was personally reviewed today and demonstrated:  NSR, poor anterior RW  Recent Labs: 08/16/2019: NT-Pro BNP 38 08/28/2019: B Natriuretic Peptide 26.9 08/29/2019: TSH 0.331 08/30/2019: ALT 39; Hemoglobin 13.5; Magnesium 2.1; Platelets 219 09/21/2019: BUN 32; Creatinine, Ser 0.92; Potassium 3.7; Sodium 140   Recent Lipid Panel Lab Results  Component Value Date/Time   CHOL 148 06/07/2018 10:24 AM   TRIG 163 (H) 08/28/2019 11:00 AM   HDL 51 06/07/2018 10:24 AM   CHOLHDL 2.9 06/07/2018 10:24 AM   CHOLHDL 2.7 05/25/2015 03:33 AM   LDLCALC 76 06/07/2018 10:24 AM    Wt Readings from Last 3 Encounters:  09/19/19 280 lb (127 kg)  09/18/19 280 lb 12.8 oz (127.4 kg)  09/01/19 277 lb (125.6 kg)     Objective:    Vital Signs:  Ht '5\' 2"'$  (1.575 m)   LMP 10/15/2018 Comment: patient had pregnancy test with PCP (test was negative)  BMI 51.21 kg/m    VITAL SIGNS:  reviewed  ASSESSMENT & PLAN:    Chronic diastolic heart failure [facility]) Echo Jan 2019-  EF  60-65% with mild LVH (no significant diastolic dysfunction noted).  COPD mixed type ([place]) Followed by [name]- multiple inhalers, she continues to smoke  Morbid obesity due to excess calories ([facility]) BMI 50  OSA (obstructive sleep apnea) On C-pap  Lymphedema She is on Lasix 80 mg BID, Maxzide stopped during her recent admission.   HTN- She has tolerated Bisoprolol in the past with her COPD and I will recommend this be resumed.  She will need prior authorization for this.   COVID-19 Education: The signs and symptoms of COVID-19 were discussed with the patient and how to seek care for testing (follow up with PCP or arrange E-visit).   importance of social distancing was discussed today.  Time:   Today, I have spent 15 minutes with the patient with telehealth technology discussing the above problems.     Medication Adjustments/Labs and Tests Ordered: Current medicines are reviewed at length with the patient today.  Concerns regarding medicines are outlined above.   Tests Ordered: No orders of the defined types were placed in this encounter.   Medication Changes: No orders of the defined types were placed in this encounter.   Follow Up:  In Person in the office with Dr Oval Linsey in 6 months.   Signed, Kerin Ransom, PA-C  01/23/2020 10:21 AM    [place]

## 2020-02-26 ENCOUNTER — Other Ambulatory Visit: Payer: Self-pay

## 2020-02-26 ENCOUNTER — Ambulatory Visit (HOSPITAL_COMMUNITY)
Admission: EM | Admit: 2020-02-26 | Discharge: 2020-02-26 | Disposition: A | Discharge status: Home / Self Care | Payer: Medicaid Other

## 2020-02-26 DIAGNOSIS — G5611 Other lesions of median nerve, right upper limb: Secondary | ICD-10-CM

## 2020-02-26 DIAGNOSIS — M25531 Pain in right wrist: Secondary | ICD-10-CM

## 2020-02-26 NOTE — ED Triage Notes (Signed)
Pt is here with right wrist pain/swelling that started yesterday, pt has taken Advil/Hydrocodone 600MG (daughter's meds) to relieve discomfort.

## 2020-02-26 NOTE — Discharge Instructions (Signed)
Wear the wrist brace throughout the day and at night  Stop the ibuprofen. Continue your current pain management  Call your PCP for follow up in 1 week

## 2020-02-26 NOTE — ED Provider Notes (Signed)
[place]    CSN: [id number] Arrival date & time: 02/26/20  4098      History   Chief Complaint Chief Complaint  Patient presents with  . Wrist Pain    HPI Lori Bean is a 49 y.o. female.   Patient with history of CHF, COPD and chronic pain presents for evaluation of right wrist pain.  She reports over the last 1 to 2 days she has noticed shooting pain starting in the wrist shoots down into her thumb and first 2 fingers as well as up her forearm occasionally all the way to her shoulder.  She reports the pain is not there at rest however certain movements make it occur.  She reports pins-and-needles/falling asleep feelings at night and the pain in the wrist and hand at night.  She reports she has never had this issue before.  She does not have any symptoms like this in her left hand.  She denies any fevers or chills.  She is adamant that the pain starts in the wrist and goes up as opposed to down.  She does report history of right shoulder surgery and chronic pain in the right shoulder.  She also reports chronic pain in her neck.  She reports she is followed by pain clinic for these things.  Patient has been compliant with her current pain management regiment however did take ibuprofen.  She denies any chest pain or shortness of breath.  She reports she has been followed by her primary for a cough of late.  She reports the cough is not necessarily getting worse and has not had any shortness of breath.  She will follow-up with her primary care she states.     Past Medical History:  Diagnosis Date  . Abdominal hernia   . Anginal pain ([place])    occ from asthma  . Anxiety   . Arthritis   . Asthma   . Bipolar affective disorder ([place])    Schizophrenia  . Bronchitis    hx of  . CHF (congestive heart failure) ([place])   . Depression   . Diabetes mellitus without complication ([place])    " New Onset " per patient  . GERD (gastroesophageal reflux disease)   . Heart  murmur   . Hypertension    takes meds  . Lymphedema 06/07/2018  . Morbid (severe) obesity due to excess calories ([place])   . Neuropathy    left leg , "from back surgery"  . Overactive bladder   . Schizophrenia ([place])   . Sciatica   . Shortness of breath   . Sleep apnea   . Snoring disorder    Pt stated my boyfriend always wakes me up and tells me to breathe    Patient Active Problem List   Diagnosis Date Noted  . Acute on chronic respiratory failure ([place]) 09/01/2019  . COPD with acute exacerbation ([place]) 08/28/2019  . COPD exacerbation ([place]) 08/28/2019  . Acute bronchitis 08/16/2019  . Tongue cancer ([place]) 02/14/2019  . COPD mixed type ([place]) 08/17/2018  . Sinusitis 07/22/2018  . Lymphedema 06/07/2018  . Abnormal uterine bleeding (AUB) 12/12/2017  . CHF (congestive heart failure) ([place])   . Chronic asthma, mild persistent, uncomplicated 11/91/4782  . Dyspnea on exertion 05/05/2017  . Asthma exacerbation in COPD ([place]) 05/05/2017  . Morbid obesity due to excess calories ([place]) 05/05/2017  . Asthma exacerbation 12/17/2016  . 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 Synovial plica of right knee 95/62/1308  . Tear  of medial meniscus of right knee, initial encounter 09/28/2016  . OSA (obstructive sleep apnea) 09/23/2016  . Chronic diastolic heart failure ([place]) 04/22/2016  . Prediabetes 04/22/2016  . Cigarette smoker 04/03/2014  . Chest pain 04/03/2014  . Lumbar spondylosis 12/28/2013  . Extrinsic asthma 06/07/2013  . Anxiety   . Essential hypertension   . Bipolar affective disorder ([place])   . GERD (gastroesophageal reflux disease)   . Arthritis   . Schizophrenia ([place])   . Overactive bladder     Past Surgical History:  Procedure Laterality Date  . BACK SURGERY     3 back surgeries  . CHOLECYSTECTOMY    . COLONOSCOPY WITH PROPOFOL N/A 01/23/2016   Procedure: COLONOSCOPY WITH PROPOFOL;  Surgeon: Wonda Horner, MD;  Location: [facility];  Service: Endoscopy;  Laterality: N/A;    . EYE SURGERY     Metal plate in right eye. Had fracture in right eye  . gallstones reomved    . KNEE ARTHROSCOPY WITH MENISCAL REPAIR Right 09/28/2016   Procedure: KNEE ARTHROSCOPY WITH MENISCAL REPAIR;  Surgeon: Dorna Leitz, MD;  Location: [facility];  Service: Orthopedics;  Laterality: Right;  Right partial meniscectomy and chondroplasty, patellar/femoral joint and medial femoral condyle   . LUMBAR LAMINECTOMY/DECOMPRESSION MICRODISCECTOMY  04/01/2012   Procedure: LUMBAR LAMINECTOMY/DECOMPRESSION MICRODISCECTOMY 2 LEVELS;  Surgeon: Faythe Ghee, MD;  Location: [facility];  Service: Neurosurgery;  Laterality: Left;  Lumbar four-five, lumbar five sacral one microdiscectomy   . LUMBAR WOUND DEBRIDEMENT  04/29/2012   Procedure: LUMBAR WOUND DEBRIDEMENT;  Surgeon: Faythe Ghee, MD;  Location: [facility];  Service: Neurosurgery;  Laterality: N/A;  lumbar wound debridement  . ROTATOR CUFF REPAIR     Right shoulder  . TUBAL LIGATION      OB History    Gravida  4   Para  2   Term  0   Preterm  2   AB  2   Living  2     SAB  1   TAB  1   Ectopic      Multiple      Live Births  2            Home Medications    Prior to Admission medications   Medication Sig Start Date End Date Taking? Authorizing Provider  albuterol (PROAIR HFA) 108 (90 Base) MCG/ACT inhaler Inhale 2 puffs into the lungs every 4 (four) hours as needed for wheezing or shortness of breath.    [provider]  albuterol (PROVENTIL) (2.5 MG/3ML) 0.083% nebulizer solution Take 2.5 mg by nebulization every 4 (four) hours as needed for wheezing or shortness of breath.    [provider]  albuterol (VENTOLIN HFA) 108 (90 Base) MCG/ACT inhaler albuterol sulfate HFA 90 mcg/actuation aerosol inhaler  INHALE 2 PUFFS BY MOUTH FOUR TIMES DAILY    [provider]  amLODipine (NORVASC) 10 MG tablet Take 1 tablet (10 mg total) by mouth daily. 08/31/19 05/27/20  Nita Sells, MD   ARIPiprazole (ABILIFY) 20 MG tablet Take 1 tablet (20 mg total) by mouth at bedtime. 03/04/15   Lance Bosch, NP  azithromycin (ZITHROMAX) 500 MG tablet Take 1 tablet (500 mg total) by mouth daily. 08/31/19   Nita Sells, MD  bisoprolol (ZEBETA) 5 MG tablet One twice daily 01/23/20   Erlene Quan, PA-C  blood glucose meter kit and supplies KIT Dispense based on patient and insurance preference. Use up to four times daily as directed. (  FOR ICD-9 250.00, 250.01). 08/31/19   Nita Sells, MD  Budeson-Glycopyrrol-Formoterol (BREZTRI AEROSPHERE) 160-9-4.8 MCG/ACT AERO Inhale 2 puffs into the lungs 2 (two) times daily. 09/18/19   Tanda Rockers, MD  cephALEXin (KEFLEX) 500 MG capsule cephalexin 500 mg capsule  TAKE 1 CAPSULE BY MOUTH EVERY 6 HOURS    [provider]  diclofenac Sodium (VOLTAREN) 1 % GEL Apply 1 application topically 2 (two) times daily. For knee and back pain    [provider]  DULoxetine (CYMBALTA) 30 MG capsule Take 30 mg by mouth 2 (two) times daily.    [provider]  enoxaparin (LOVENOX) 40 MG/0.4ML injection enoxaparin 40 mg/0.4 mL subcutaneous syringe  ADMINISTER 0.4 ML UNDER THE SKIN EVERY DAY AS DIRECTED    [provider]  fluconazole (DIFLUCAN) 150 MG tablet fluconazole 150 mg tablet  TAKE 1 TABLET BY MOUTH EVERY DAY AND REPEAT AS NEEDED    [provider]  fluticasone (CUTIVATE) 0.05 % cream fluticasone propionate 0.05 % topical cream  APPLY EXTERNALLY TO THE AFFECTED AREA TWICE DAILY FOR 14 DAYS    [provider]  furosemide (LASIX) 80 MG tablet Take 68m in the morning and 479min the evening. 09/26/19   KiErlene QuanPA-C  gabapentin (NEURONTIN) 300 MG capsule Take 300 mg by mouth 3 (three) times daily.  02/02/19   [provider]  glimepiride (AMARYL) 1 MG tablet Take 1 tablet (1 mg total) by mouth daily with breakfast. 08/31/19   SaNita SellsMD  glipiZIDE (GLUCOTROL XL) 10 MG  24 hr tablet glipizide ER 10 mg tablet, extended release 24 hr  TAKE 1 TABLET BY MOUTH EVERY DAY    [provider]  glipiZIDE (GLUCOTROL XL) 5 MG 24 hr tablet glipizide ER 5 mg tablet, extended release 24 hr  TAKE 1 TABLET BY MOUTH EVERY DAY    [provider]  HYDROcodone-acetaminophen (NORCO/VICODIN) 5-325 MG tablet Take 1-2 tablets by mouth See admin instructions. Take 2 tablets by mouth daily at bedtime, may also take 1-2 tablets every 4 hours as needed for pain 02/01/19   [provider]  HYDROcodone-acetaminophen (NORCO/VICODIN) 5-325 MG tablet hydrocodone 5 mg-acetaminophen 325 mg tablet  Take 1 tablet every 6 hours by oral route.    [provider]  hydrOXYzine (VISTARIL) 25 MG capsule hydroxyzine pamoate 25 mg capsule  TAKE 1 CAPSULE BY MOUTH EVERY NIGHT AT BEDTIME    [provider]  insulin aspart protamine - aspart (NOVOLOG 70/30 MIX) (70-30) 100 UNIT/ML FlexPen Inject 0.18 mLs (18 Units total) into the skin 2 (two) times daily with a meal. Patient taking differently: Inject 18 Units into the skin 3 (three) times daily with meals.  08/31/19   SaNita SellsMD  insulin aspart protamine - aspart (NOVOLOG MIX 70/30 FLEXPEN) (70-30) 100 UNIT/ML FlexPen Novolog Mix 70-30 FlexPen U-100 Insulin 100 unit/mL subcutaneous pen  INJECT 18 UNITS INTO SKIN TWICE DAILY WITH A MEAL    [provider]  insulin glargine (LANTUS SOLOSTAR) 100 UNIT/ML Solostar Pen Lantus Solostar U-100 Insulin 100 unit/mL (3 mL) subcutaneous pen  INJECT 20 UNITS UNDER THE SKIN EVERY NIGHT AT BEDTIME    [provider]  Insulin Pen Needle (NOVOFINE) 30G X 8 MM MISC Inject 10 each into the skin as needed. 08/31/19   SaNita SellsMD  ipratropium (ATROVENT HFA) 17 MCG/ACT inhaler Atrovent HFA 17 mcg/actuation aerosol inhaler  INHALE 2 PUFFS INTO THE LUNGS EVERY 6 HOURS    [provider]  meloxicam (MOBIC) 15 MG tablet Take 1 tablet (15 mg  total) by mouth daily as needed for pain. Patient taking differently: Take 15 mg by mouth daily.  05/16/19   Fransico Meadow, PA-C  metFORMIN (GLUCOPHAGE) 1000 MG tablet metformin 1,000 mg tablet  TAKE 1 TABLET BY MOUTH TWICE DAILY    [provider]  methocarbamol (ROBAXIN) 500 MG tablet Take 500 mg by mouth 2 (two) times daily.     [provider]  methocarbamol (ROBAXIN) 500 MG tablet methocarbamol 500 mg tablet  Take 1 tablet every 6-8 hours by oral route as needed for spasm for 10 days.    [provider]  montelukast (SINGULAIR) 10 MG tablet Take 1 tablet (10 mg total) by mouth at bedtime. 09/20/19   Martyn Ehrich, NP  nicotine (NICODERM CQ - DOSED IN MG/24 HOURS) 21 mg/24hr patch nicotine 21 mg/24 hr daily transdermal patch    [provider]  omeprazole (PRILOSEC) 20 MG capsule omeprazole 20 mg capsule,delayed release  TAKE 1 CAPSULE BY MOUTH EVERY DAY    [provider]  omeprazole (PRILOSEC) 40 MG capsule Take 1 capsule (40 mg total) by mouth daily. 12/14/17   [name], MD  oxyCODONE-acetaminophen (PERCOCET/ROXICET) 5-325 MG tablet oxycodone-acetaminophen 5 mg-325 mg tablet  TAKE 1 TO 2 TABLETS BY MOUTH EVERY 4 TO 6 HOURS AS NEEDED FOR PAIN. NOT TO EXCEED 6 TABLETS A DAY    [provider]  potassium chloride (KLOR-CON) 10 MEQ tablet potassium chloride ER 10 mEq tablet,extended release  TAKE 1 TABLET BY MOUTH TWICE DAILY    [provider]  potassium chloride SA (K-DUR,KLOR-CON) 20 MEQ tablet Take 40 mEq by mouth 2 (two) times daily.     [provider]  predniSONE (DELTASONE) 10 MG tablet Take  4 each am x 2 days,   2 each am x 2 days,  1 each am x 2 days and stop 09/18/19   Tanda Rockers, MD  predniSONE (DELTASONE) 20 MG tablet prednisone 20 mg tablet    [provider]  [place] into the lungs at bedtime. CPAP    [provider]  Respiratory Therapy Supplies  (FLUTTER) DEVI Use flutter device 3 times a day 07/20/18   Martyn Ehrich, NP  tiZANidine (ZANAFLEX) 4 MG tablet tizanidine 4 mg tablet  TAKE 1 TABLET BY MOUTH EVERY 8 HOURS AS NEEDED    [provider]  Triamcinolone Acetonide 0.025 % LOTN Apply 1 application topically 3 (three) times daily.    [provider]  triamterene-hydrochlorothiazide (MAXZIDE-25) 37.5-25 MG tablet triamterene 37.5 mg-hydrochlorothiazide 25 mg tablet  TAKE 1 TABLET BY MOUTH DAILY    [provider]  valsartan (DIOVAN) 160 MG tablet Take 1 tablet (160 mg total) by mouth daily. 09/01/19   Erlene Quan, PA-C  valsartan (DIOVAN) 80 MG tablet valsartan 80 mg tablet  TAKE 1 TABLET BY MOUTH EVERY DAY    [provider]    Family History Family History  Problem Relation Age of Onset  . Diabetes Mother   . Hypertension Mother   . Diabetes Father   . Heart disease Paternal Aunt   . Cancer Paternal Aunt     Social History Social History   Tobacco Use  . Smoking status: Current Every Day Smoker    Packs/day: 1.00    Years: 24.00    Pack years: 24.00    Types: Cigarettes  . Smokeless tobacco: Never  Used  Vaping Use  . Vaping Use: Never used  Substance Use Topics  . Alcohol use: No    Alcohol/week: 0.0 standard drinks    Comment: quit Nov. 2014  . Drug use: No     Allergies   Aspirin   Review of Systems Review of Systems   Physical Exam Triage Vital Signs ED Triage Vitals  Enc Vitals Group     BP 02/26/20 0916 139/76     Pulse Rate 02/26/20 0916 82     Resp 02/26/20 0916 (!) 21     Temp --      Temp Source 02/26/20 0916 Oral     SpO2 02/26/20 0916 90 %     Weight 02/26/20 0926 276 lb 9.6 oz (125.5 kg)     Height --      Head Circumference --      Peak Flow --      Pain Score 02/26/20 0926 10     Pain Loc --      Pain Edu? --      Excl. in [place]? --    No data found.  Updated Vital Signs BP 139/76 (BP Location: Left Arm)   Pulse 82   Resp (!) 21    Wt 276 lb 9.6 oz (125.5 kg)   LMP 10/15/2018 Comment: patient had pregnancy test with PCP (test was negative)  SpO2 98% Comment: per provider  BMI 50.59 kg/m   Visual Acuity Right Eye Distance:   Left Eye Distance:   Bilateral Distance:    Right Eye Near:   Left Eye Near:    Bilateral Near:     Physical Exam Vitals and nursing note reviewed.  Constitutional:      General: She is not in acute distress.    Appearance: She is well-developed. She is obese. She is not ill-appearing.  HENT:     Head: Normocephalic and atraumatic.  Eyes:     Conjunctiva/sclera: Conjunctivae normal.  Cardiovascular:     Rate and Rhythm: Normal rate and regular rhythm.     Heart sounds: No murmur heard.   Pulmonary:     Effort: Pulmonary effort is normal. No respiratory distress.     Breath sounds: Normal breath sounds. No wheezing or rales.     Comments: Speaking in full sentences.  No accessory muscle use. Abdominal:     Palpations: Abdomen is soft.     Tenderness: There is no abdominal tenderness.  Musculoskeletal:     Cervical back: Neck supple.     Comments: No swelling, erythema or deformity of the right wrist.  Some pain elicited with flexion and extension of the right wrist.  Tinel's positive.  Phalen's elicits pain.  Sensation equal bilaterally in the hand.  There is no thenar wasting.  Strength 5/5 grip, thumb opposition and finger abduction abduction.  A different shoulder pain and neck pain is elicited with range of motion of the shoulder and neck.  Skin:    General: Skin is warm and dry.  Neurological:     Mental Status: She is alert and oriented to person, place, and time.      UC Treatments / Results  Labs (all labs ordered are listed, but only abnormal results are displayed) Labs Reviewed - No data to display  EKG   Radiology No results found.  Procedures Procedures (including critical care time)  Medications Ordered in UC Medications - No data to  display  Initial Impression / Assessment and Plan / UC  Course  I have reviewed the triage vital signs and the nursing notes.  Pertinent labs & imaging results that were available during my care of the patient were reviewed by me and considered in my medical decision making (see chart for details).     #Right median nerve neuropathy #Right wrist pain Patient is a 49 year old with a suspected carpal tunnel syndrome however we cannot completely isolate location of the median nerve.  Her history is consistent with median nerve distribution and she has findings consistent with a carpal tunnel.  We will place her in a brace and recommend she continue her current pain management options.  Instructed her to stop ibuprofen given heart history and likely risk of fluid overload.  To follow-up with her primary care for her chronic conditions as well as cough that she was seen for primary previously for as well as follow-up on wrist pain for potential referral to hand orthopedics if not improving with bracing..  She is saturating well here and has a benign lung exam, will defer her chronic cough management to her primary care this time.  Patient verbalizes understanding agreement with this plan of care.   Final Clinical Impressions(s) / UC Diagnoses   Final diagnoses:  Right wrist pain  Right median nerve neuropathy     Discharge Instructions     Wear the wrist brace throughout the day and at night  Stop the ibuprofen. Continue your current pain management  Call your PCP for follow up in 1 week      ED Prescriptions    None     PDMP not reviewed this encounter.   Purnell Shoemaker, PA-C 02/26/20 1100

## 2020-04-11 ENCOUNTER — Encounter: Payer: Self-pay | Admitting: Nurse Practitioner

## 2020-04-15 ENCOUNTER — Other Ambulatory Visit: Payer: Self-pay | Admitting: Physician Assistant

## 2020-04-15 DIAGNOSIS — R140 Abdominal distension (gaseous): Secondary | ICD-10-CM

## 2020-04-15 DIAGNOSIS — R197 Diarrhea, unspecified: Secondary | ICD-10-CM

## 2020-04-15 DIAGNOSIS — R110 Nausea: Secondary | ICD-10-CM

## 2020-04-25 IMAGING — DX DG CHEST 2V
2 series · 2 of 2 positions shown · non-contrast
Comparison: Chest x-ray of 11/04/2017

CLINICAL DATA: Right-sided chest pain, shortness of breath, cough
for 2 weeks

EXAM:
CHEST - 2 VIEW

[chest pa]
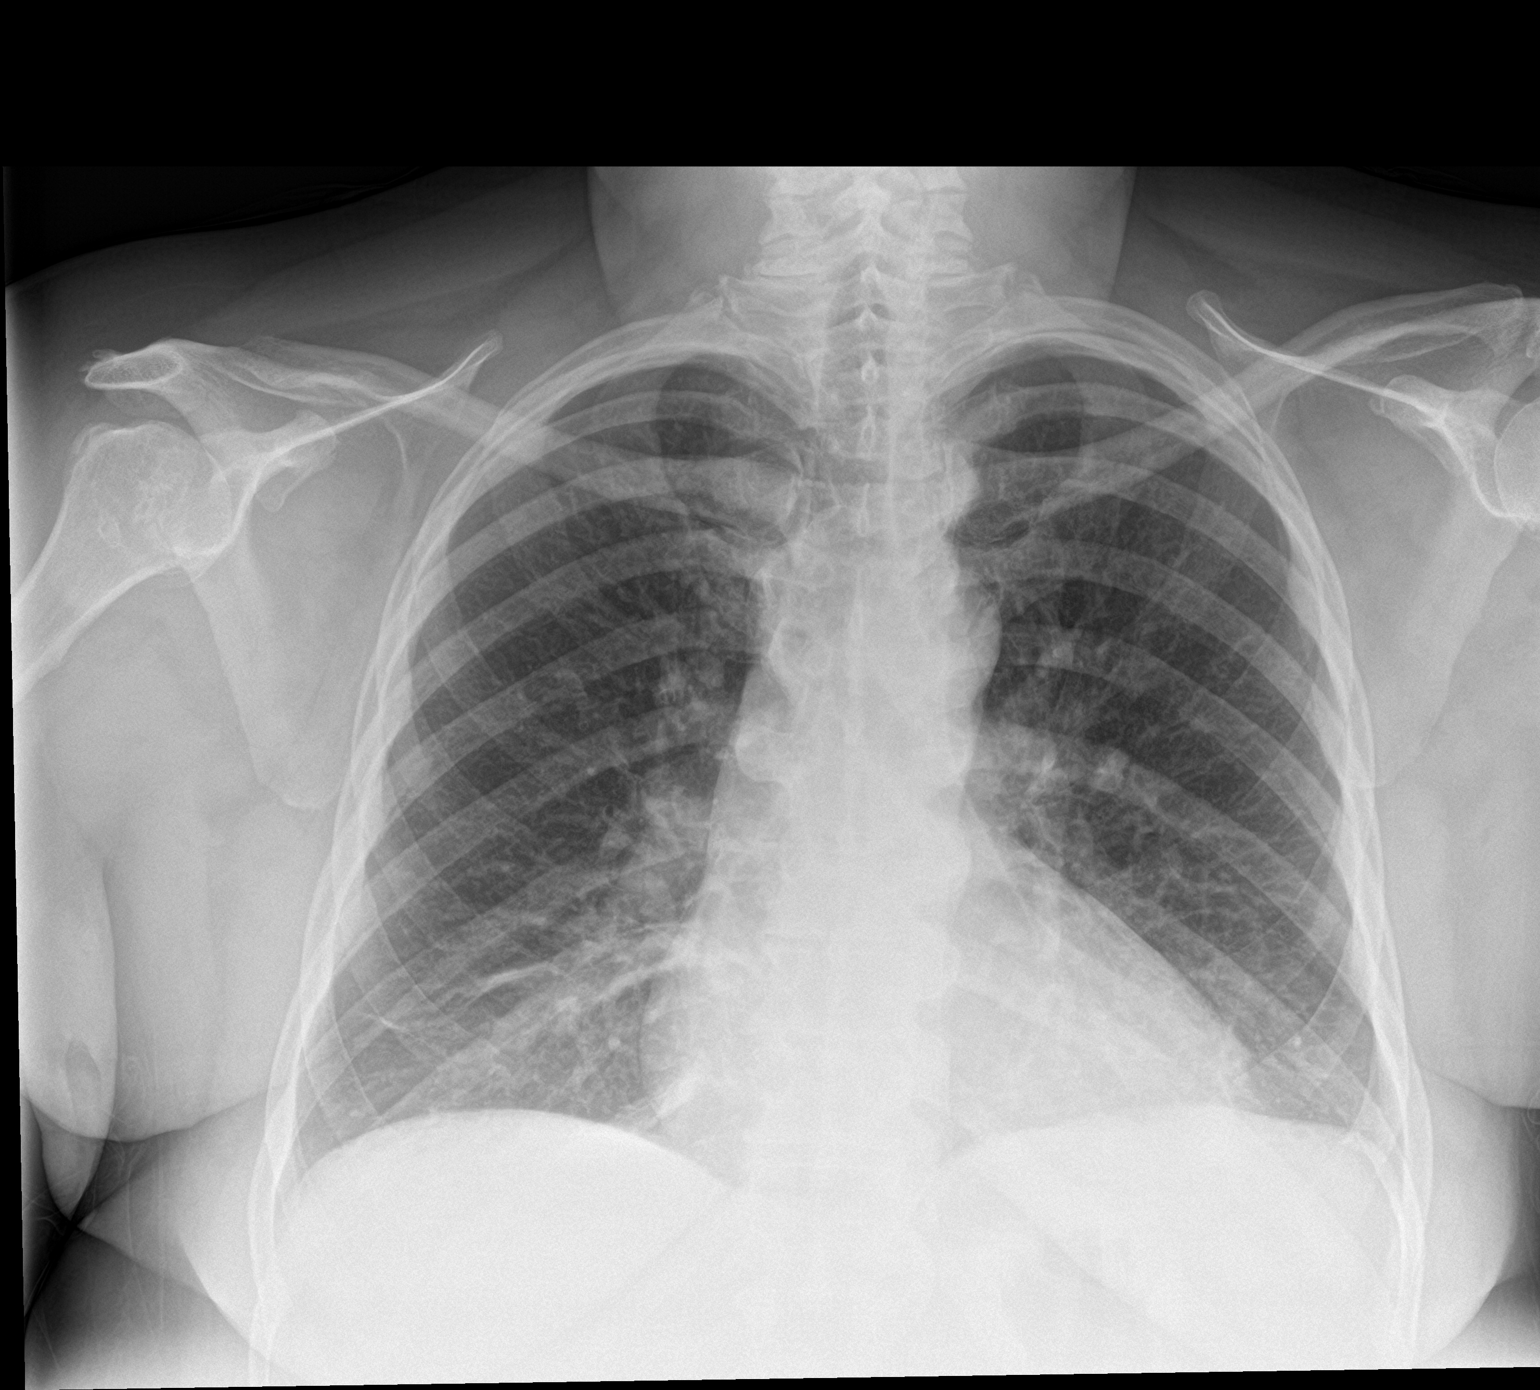

[chest lat]
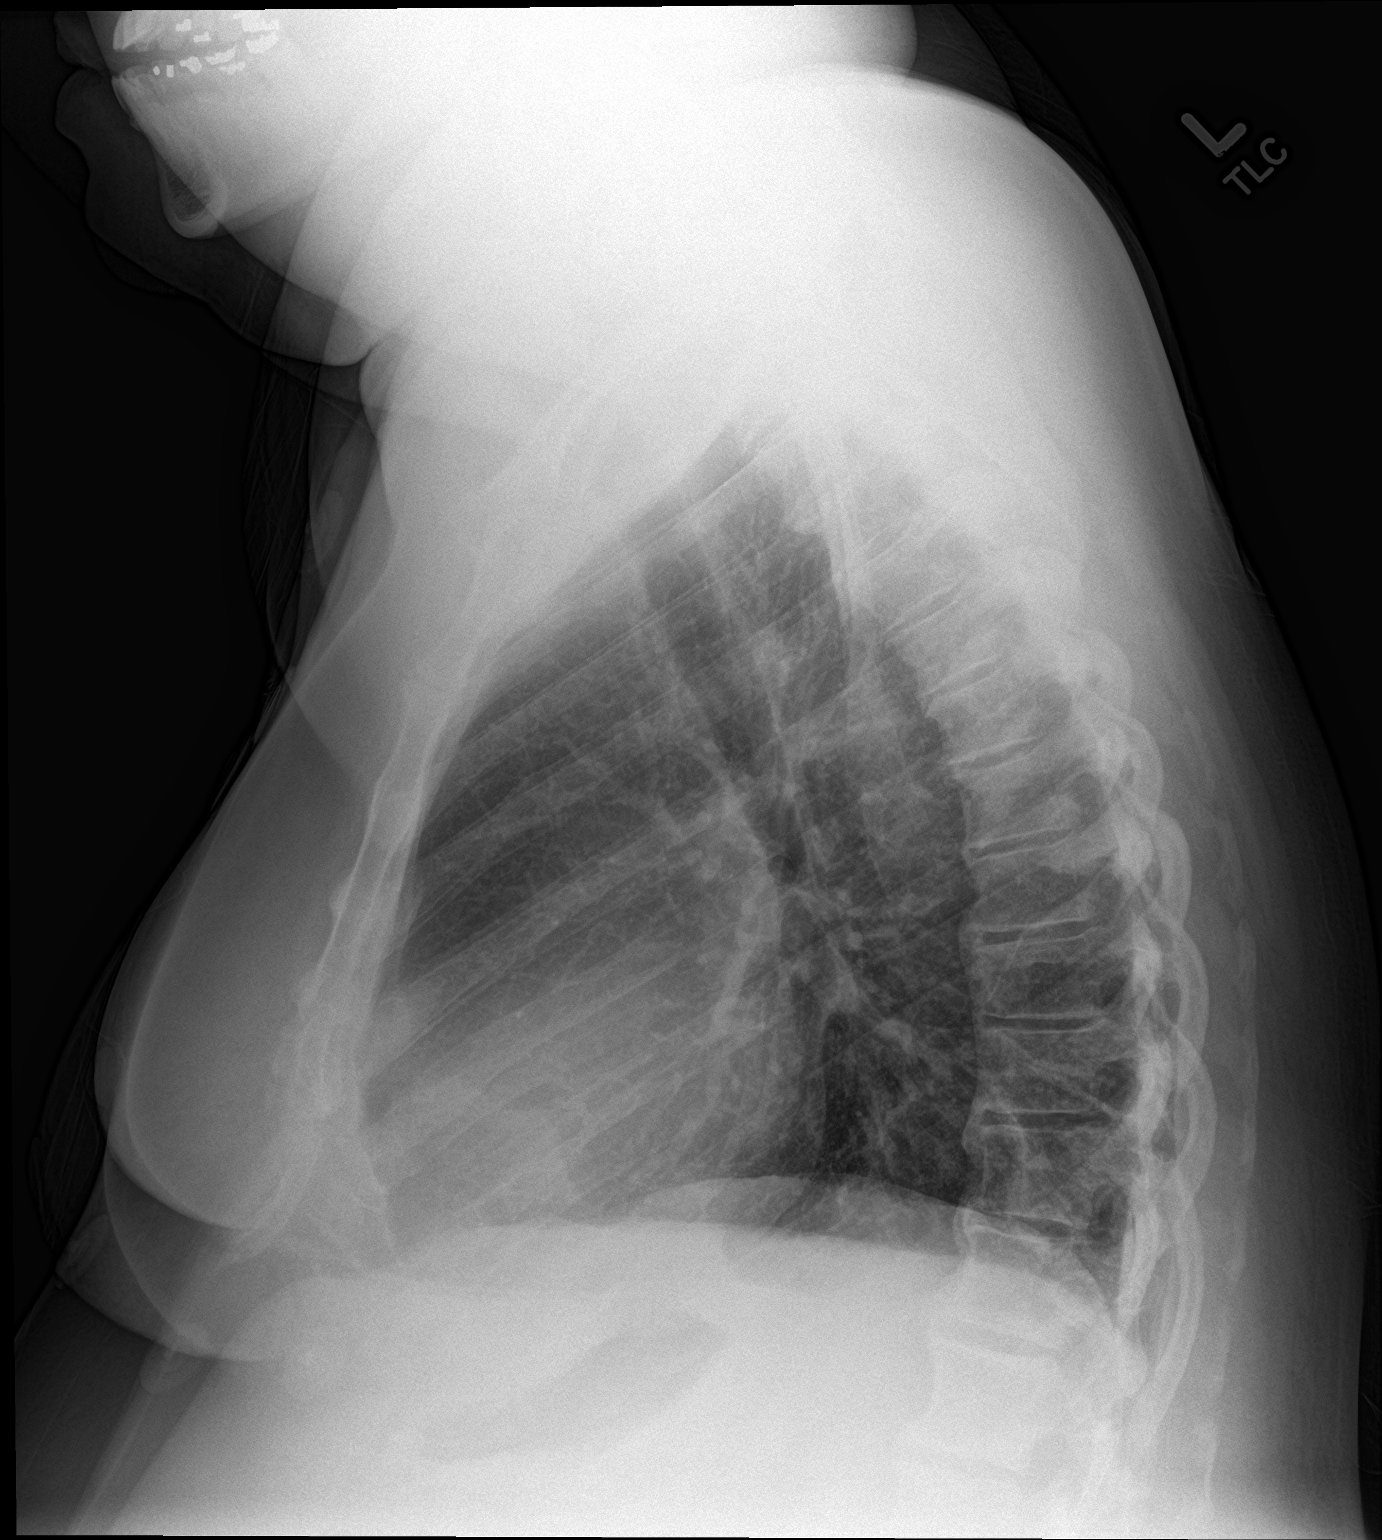

[2 of 2 positions shown; findings below may reference images not displayed]

FINDINGS: There is bibasilar linear atelectasis present. No infiltrate or
effusion is seen. Mediastinal and hilar contours are unremarkable.
The heart is mildly enlarged and stable. No acute bony abnormality
seen with degenerative changes throughout the mid to lower thoracic
spine.
IMPRESSION: Bibasilar linear atelectasis. No definite pneumonia or pleural
effusion.

## 2020-04-26 ENCOUNTER — Other Ambulatory Visit: Payer: Self-pay

## 2020-04-26 ENCOUNTER — Ambulatory Visit
Admission: RE | Admit: 2020-04-26 | Discharge: 2020-04-26 | Disposition: A | Discharge status: Home / Self Care | Payer: Medicaid Other | Source: Ambulatory Visit | Attending: Physician Assistant | Admitting: Physician Assistant

## 2020-04-26 DIAGNOSIS — R140 Abdominal distension (gaseous): Secondary | ICD-10-CM

## 2020-04-26 DIAGNOSIS — R110 Nausea: Secondary | ICD-10-CM

## 2020-04-26 DIAGNOSIS — R197 Diarrhea, unspecified: Secondary | ICD-10-CM

## 2020-04-26 MED ORDER — IOPAMIDOL (ISOVUE-300) INJECTION 61%
100.0000 mL | Freq: Once | INTRAVENOUS | Status: AC | PRN
  Administered 2020-04-26: 100 mL via INTRAVENOUS

## 2020-05-06 ENCOUNTER — Encounter (HOSPITAL_COMMUNITY): Payer: Self-pay

## 2020-05-06 ENCOUNTER — Ambulatory Visit (INDEPENDENT_AMBULATORY_CARE_PROVIDER_SITE_OTHER): Payer: Medicaid Other

## 2020-05-06 ENCOUNTER — Ambulatory Visit (HOSPITAL_COMMUNITY)
Admission: EM | Admit: 2020-05-06 | Discharge: 2020-05-06 | Disposition: A | Discharge status: Home / Self Care | Payer: Medicaid Other | Attending: Family Medicine | Admitting: Family Medicine

## 2020-05-06 ENCOUNTER — Other Ambulatory Visit: Payer: Self-pay

## 2020-05-06 DIAGNOSIS — W010XXA Fall on same level from slipping, tripping and stumbling without subsequent striking against object, initial encounter: Secondary | ICD-10-CM | POA: Diagnosis not present

## 2020-05-06 DIAGNOSIS — M25462 Effusion, left knee: Secondary | ICD-10-CM

## 2020-05-06 DIAGNOSIS — M25562 Pain in left knee: Secondary | ICD-10-CM | POA: Diagnosis not present

## 2020-05-06 DIAGNOSIS — W19XXXA Unspecified fall, initial encounter: Secondary | ICD-10-CM

## 2020-05-06 MED ORDER — HYDROCODONE-ACETAMINOPHEN 5-325 MG PO TABS: 1.0000 | ORAL_TABLET | Freq: Four times a day (QID) | ORAL | 0 refills | Status: DC | PRN

## 2020-05-06 NOTE — ED Triage Notes (Signed)
Pt c/o trip and fall on Friday L knee and L shoulder pain, unknown if hit head but states she has intermittent headaches since the fall. Denies blood thinner use.

## 2020-05-06 NOTE — Discharge Instructions (Signed)
Ice to area for 20 min every couple of hours Take the pain medicine as needed Reduce walking while knee hurts See your orthopedic if not improving in a week

## 2020-05-06 NOTE — ED Provider Notes (Signed)
[facility]    CSN: [id number] Arrival date & time: 05/06/20  1008      History   Chief Complaint Chief Complaint  Patient presents with  . Fall    HPI Lori Bean is a 49 y.o. female.   HPI  Patient states she was walking across her own yard.  There was an uneven place and she tripped.  [place] forward on her knees and hands.  Did not hit her head.  Has pain in her knees bilaterally left greater than right and in her left shoulder.  No head injury.  No headache.  She has problems with walking, but states she can put weight on her leg. Patient has chronic knee problems.  Known severe osteoarthritis.  She is on pain management, hydrocodone.  She stopped this in June because she had to travel for family emergency.  Has not had any pain medicine in months.  She has had multiple knee surgeries.  Arthroscopic surgery on the left knee this year.  Patient states she did not have this degree of pain or a swelling in her knee until her fall. Patient does have a history of diabetes, heart disease, arthritis and anxiety.  Multiple medical problems.  Severe morbid obesity.  Past Medical History:  Diagnosis Date  . Abdominal hernia   . Anginal pain ([place])    occ from asthma  . Anxiety   . Arthritis   . Asthma   . Bipolar affective disorder ([place])    Schizophrenia  . Bronchitis    hx of  . CHF (congestive heart failure) ([place])   . Depression   . Diabetes mellitus without complication ([place])    " New Onset " per patient  . GERD (gastroesophageal reflux disease)   . Heart murmur   . Hypertension    takes meds  . Lymphedema 06/07/2018  . Morbid (severe) obesity due to excess calories ([place])   . Neuropathy    left leg , "from back surgery"  . Overactive bladder   . Schizophrenia ([place])   . Sciatica   . Shortness of breath   . Sleep apnea   . Snoring disorder    Pt stated my boyfriend always wakes me up and tells me to breathe    Patient Active Problem List    Diagnosis Date Noted  . Acute on chronic respiratory failure ([place]) 09/01/2019  . COPD with acute exacerbation ([place]) 08/28/2019  . COPD exacerbation ([place]) 08/28/2019  . Acute bronchitis 08/16/2019  . Tongue cancer ([place]) 02/14/2019  . 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 Sinusitis 07/22/2018  . Lymphedema 06/07/2018  . Abnormal uterine bleeding (AUB) 12/12/2017  . CHF (congestive heart failure) ([place])   . Chronic asthma, mild persistent, uncomplicated 32/95/1884  . Dyspnea on exertion 05/05/2017  . Asthma exacerbation in COPD ([place]) 05/05/2017  . Morbid obesity due to excess calories ([place]) 05/05/2017  . Asthma exacerbation 12/17/2016  . 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 Synovial plica of right knee 16/60/6301  . Tear of medial meniscus of right knee, initial encounter 09/28/2016  . OSA (obstructive sleep apnea) 09/23/2016  . Chronic diastolic heart failure ([place]) 04/22/2016  . Prediabetes 04/22/2016  . Cigarette smoker 04/03/2014  . Chest pain 04/03/2014  . Lumbar spondylosis 12/28/2013  . Extrinsic asthma 06/07/2013  . Anxiety   . Essential hypertension   . Bipolar affective disorder ([place])   . GERD (gastroesophageal reflux disease)   . Arthritis   . Schizophrenia ([place])   .  Overactive bladder     Past Surgical History:  Procedure Laterality Date  . BACK SURGERY     3 back surgeries  . CHOLECYSTECTOMY    . COLONOSCOPY WITH PROPOFOL N/A 01/23/2016   Procedure: COLONOSCOPY WITH PROPOFOL;  Surgeon: Wonda Horner, MD;  Location: [facility];  Service: Endoscopy;  Laterality: N/A;  . EYE SURGERY     Metal plate in right eye. Had fracture in right eye  . gallstones reomved    . KNEE ARTHROSCOPY WITH MENISCAL REPAIR Right 09/28/2016   Procedure: KNEE ARTHROSCOPY WITH MENISCAL REPAIR;  Surgeon: Dorna Leitz, MD;  Location: [facility];  Service: Orthopedics;  Laterality: Right;  Right partial meniscectomy and chondroplasty, patellar/femoral joint and medial femoral condyle   . LUMBAR  LAMINECTOMY/DECOMPRESSION MICRODISCECTOMY  04/01/2012   Procedure: LUMBAR LAMINECTOMY/DECOMPRESSION MICRODISCECTOMY 2 LEVELS;  Surgeon: Faythe Ghee, MD;  Location: [facility];  Service: Neurosurgery;  Laterality: Left;  Lumbar four-five, lumbar five sacral one microdiscectomy   . LUMBAR WOUND DEBRIDEMENT  04/29/2012   Procedure: LUMBAR WOUND DEBRIDEMENT;  Surgeon: Faythe Ghee, MD;  Location: [facility];  Service: Neurosurgery;  Laterality: N/A;  lumbar wound debridement  . ROTATOR CUFF REPAIR     Right shoulder  . TUBAL LIGATION      OB History    Gravida  4   Para  2   Term  0   Preterm  2   AB  2   Living  2     SAB  1   TAB  1   Ectopic      Multiple      Live Births  2            Home Medications    Prior to Admission medications   Medication Sig Start Date End Date Taking? Authorizing Provider  albuterol (PROAIR HFA) 108 (90 Base) MCG/ACT inhaler Inhale 2 puffs into the lungs every 4 (four) hours as needed for wheezing or shortness of breath.    [provider]  albuterol (PROVENTIL) (2.5 MG/3ML) 0.083% nebulizer solution Take 2.5 mg by nebulization every 4 (four) hours as needed for wheezing or shortness of breath.    [provider]  albuterol (VENTOLIN HFA) 108 (90 Base) MCG/ACT inhaler albuterol sulfate HFA 90 mcg/actuation aerosol inhaler  INHALE 2 PUFFS BY MOUTH FOUR TIMES DAILY    [provider]  amLODipine (NORVASC) 10 MG tablet Take 1 tablet (10 mg total) by mouth daily. 08/31/19 05/27/20  Nita Sells, MD  ARIPiprazole (ABILIFY) 20 MG tablet Take 1 tablet (20 mg total) by mouth at bedtime. 03/04/15   Lance Bosch, NP  bisoprolol (ZEBETA) 5 MG tablet One twice daily 01/23/20   Erlene Quan, PA-C  blood glucose meter kit and supplies KIT Dispense based on patient and insurance preference. Use up to four times daily as directed. (FOR ICD-9 250.00, 250.01). 08/31/19   Nita Sells, MD    Budeson-Glycopyrrol-Formoterol (BREZTRI AEROSPHERE) 160-9-4.8 MCG/ACT AERO Inhale 2 puffs into the lungs 2 (two) times daily. 09/18/19   Tanda Rockers, MD  fluticasone (CUTIVATE) 0.05 % cream fluticasone propionate 0.05 % topical cream  APPLY EXTERNALLY TO THE AFFECTED AREA TWICE DAILY FOR 14 DAYS    [provider]  furosemide (LASIX) 80 MG tablet Take 63m in the morning and 448min the evening. Patient taking differently: 80 mg 2 (two) times daily. Take 8030mn the morning and 53m89m the evening. 09/26/19   [place],  Luke K, PA-C  gabapentin (NEURONTIN) 300 MG capsule Take 300 mg by mouth 3 (three) times daily.  02/02/19   [provider]  glimepiride (AMARYL) 1 MG tablet Take 1 tablet (1 mg total) by mouth daily with breakfast. 08/31/19   Nita Sells, MD  glipiZIDE (GLUCOTROL XL) 10 MG 24 hr tablet glipizide ER 10 mg tablet, extended release 24 hr  TAKE 1 TABLET BY MOUTH EVERY DAY    [provider]  glipiZIDE (GLUCOTROL XL) 5 MG 24 hr tablet glipizide ER 5 mg tablet, extended release 24 hr  TAKE 1 TABLET BY MOUTH EVERY DAY    [provider]  HYDROcodone-acetaminophen (NORCO/VICODIN) 5-325 MG tablet Take 1-2 tablets by mouth See admin instructions. Take 2 tablets by mouth daily at bedtime, may also take 1-2 tablets every 4 hours as needed for pain 02/01/19   [provider]  HYDROcodone-acetaminophen (NORCO/VICODIN) 5-325 MG tablet hydrocodone 5 mg-acetaminophen 325 mg tablet  Take 1 tablet every 6 hours by oral route.    [provider]  HYDROcodone-acetaminophen (NORCO/VICODIN) 5-325 MG tablet Take 1-2 tablets by mouth every 6 (six) hours as needed. 05/06/20   Raylene Everts, MD  hydrOXYzine (VISTARIL) 25 MG capsule hydroxyzine pamoate 25 mg capsule  TAKE 1 CAPSULE BY MOUTH EVERY NIGHT AT BEDTIME    [provider]  insulin aspart protamine - aspart (NOVOLOG 70/30 MIX) (70-30) 100 UNIT/ML FlexPen Inject 0.18 mLs (18  Units total) into the skin 2 (two) times daily with a meal. Patient taking differently: Inject 18 Units into the skin 3 (three) times daily with meals.  08/31/19   Nita Sells, MD  Insulin Pen Needle (NOVOFINE) 30G X 8 MM MISC Inject 10 each into the skin as needed. 08/31/19   Nita Sells, MD  ipratropium (ATROVENT HFA) 17 MCG/ACT inhaler Atrovent HFA 17 mcg/actuation aerosol inhaler  INHALE 2 PUFFS INTO THE LUNGS EVERY 6 HOURS    [provider]  metFORMIN (GLUCOPHAGE) 1000 MG tablet metformin 1,000 mg tablet  TAKE 1 TABLET BY MOUTH TWICE DAILY    [provider]  montelukast (SINGULAIR) 10 MG tablet Take 1 tablet (10 mg total) by mouth at bedtime. 09/20/19   Martyn Ehrich, NP  omeprazole (PRILOSEC) 20 MG capsule omeprazole 20 mg capsule,delayed release  TAKE 1 CAPSULE BY MOUTH EVERY DAY    [provider]  omeprazole (PRILOSEC) 40 MG capsule Take 1 capsule (40 mg total) by mouth daily. 12/14/17   [name], MD  potassium chloride (KLOR-CON) 10 MEQ tablet potassium chloride ER 10 mEq tablet,extended release  TAKE 1 TABLET BY MOUTH TWICE DAILY    [provider]  potassium chloride SA (K-DUR,KLOR-CON) 20 MEQ tablet Take 40 mEq by mouth 2 (two) times daily.     [provider]  PRESCRIPTION MEDICATION Inhale into the lungs at bedtime. CPAP    [provider]  Respiratory Therapy Supplies (FLUTTER) DEVI Use flutter device 3 times a day 07/20/18   Martyn Ehrich, NP  Triamcinolone Acetonide 0.025 % LOTN Apply 1 application topically 3 (three) times daily.    [provider]  triamterene-hydrochlorothiazide (MAXZIDE-25) 37.5-25 MG tablet triamterene 37.5 mg-hydrochlorothiazide 25 mg tablet  TAKE 1 TABLET BY MOUTH DAILY    [provider]  valsartan (DIOVAN) 160 MG tablet Take 1 tablet (160 mg total) by mouth daily. 09/01/19   Erlene Quan, PA-C  valsartan (DIOVAN) 80 MG tablet valsartan 80 mg  tablet  TAKE 1 TABLET BY  MOUTH EVERY DAY    [provider]    Family History Family History  Problem Relation Age of Onset  . Diabetes Mother   . Hypertension Mother   . Diabetes Father   . Heart disease Paternal Aunt   . Cancer Paternal Aunt     Social History Social History   Tobacco Use  . Smoking status: Current Every Day Smoker    Packs/day: 1.00    Years: 24.00    Pack years: 24.00    Types: Cigarettes  . Smokeless tobacco: Never Used  Vaping Use  . Vaping Use: Never used  Substance Use Topics  . Alcohol use: No    Alcohol/week: 0.0 standard drinks    Comment: quit Nov. 2014  . Drug use: No     Allergies   Aspirin   Review of Systems Review of Systems  See HPI Physical Exam Triage Vital Signs ED Triage Vitals  Enc Vitals Group     BP 05/06/20 1115 (!) 144/74     Pulse Rate 05/06/20 1113 90     Resp 05/06/20 1113 16     Temp 05/06/20 1115 98 F (36.7 C)     Temp src --      SpO2 05/06/20 1113 97 %     Weight --      Height --      Head Circumference --      Peak Flow --      Pain Score 05/06/20 1112 10     Pain Loc --      Pain Edu? --      Excl. in [place]? --    No data found.  Updated Vital Signs BP (!) 144/74   Pulse 90   Temp 98 F (36.7 C)   Resp 16   LMP 10/15/2018 Comment: patient had pregnancy test with PCP (test was negative)  SpO2 97%   Physical Exam Constitutional:      General: She is not in acute distress.    Appearance: She is well-developed.     Comments: Morbidly obese.  Walks with great difficulty taking small steps, holding onto furniture.  HENT:     Head: Normocephalic and atraumatic.     Mouth/Throat:     Comments: Mask is in place Eyes:     Conjunctiva/sclera: Conjunctivae normal.     Pupils: Pupils are equal, round, and reactive to light.  Cardiovascular:     Rate and Rhythm: Normal rate.  Pulmonary:     Effort: Pulmonary effort is normal. No respiratory distress.  Abdominal:     General: There  is no distension.     Palpations: Abdomen is soft.  Musculoskeletal:        General: Normal range of motion.     Cervical back: Normal range of motion.     Comments: Difficult knee exam.  Patient unable to get onto exam table.  Significant adiposity about the knee joint.  Left knee has obvious effusion warmth.  Tenderness from the upper tibia to the tibial tubercle, around both the medial and lateral joint lines.  No patellar tenderness.  Patient can flex to 90 degrees and extend to almost full.  No instability appreciated  Skin:    General: Skin is warm and dry.  Neurological:     Mental Status: She is alert.     Gait: Gait abnormal.  Psychiatric:        Mood and Affect: Mood normal.  Behavior: Behavior normal.      UC Treatments / Results  Labs (all labs ordered are listed, but only abnormal results are displayed) Labs Reviewed - No data to display  EKG   Radiology DG Knee Complete 4 Views Left  Result Date: 05/06/2020 CLINICAL DATA:  49 year old female status post fall on the left knee 3 days ago with continued pain. EXAM: LEFT KNEE - COMPLETE 4+ VIEW COMPARISON:  Left knee series 01/07/2013. FINDINGS: Progressed tricompartmental degenerative spurring since 2014. Progressed degenerative spurring at the tibia intercondylar eminence. Small suprapatellar joint effusion suspected and new. Mild to moderate medial compartment joint space loss has progressed. No acute osseous abnormality identified. No discrete soft tissue injury. IMPRESSION: 1. New small joint effusion. No acute osseous abnormality identified. 2. Progressed tricompartmental degenerative changes since 2014. Electronically Signed   By: Genevie Ann M.D.   On: 05/06/2020 12:11    Procedures Procedures (including critical care time)  Medications Ordered in UC Medications - No data to display  Initial Impression / Assessment and Plan / UC Course  I have reviewed the triage vital signs and the nursing  notes.  Pertinent labs & imaging results that were available during my care of the patient were reviewed by me and considered in my medical decision making (see chart for details).    Conservative management discussed.  Limited number of pain medicines prescribed until patient can get back in with her pain provider.  Follow-up with orthopedic  Final Clinical Impressions(s) / UC Diagnoses   Final diagnoses:  Knee effusion, left  Acute pain of left knee  Fall in home, initial encounter     Discharge Instructions     Ice to area for 20 min every couple of hours Take the pain medicine as needed Reduce walking while knee hurts See your orthopedic if not improving in a week   ED Prescriptions    Medication Sig Dispense Auth. Provider   HYDROcodone-acetaminophen (NORCO/VICODIN) 5-325 MG tablet Take 1-2 tablets by mouth every 6 (six) hours as needed. 20 tablet Raylene Everts, MD     I have reviewed the PDMP during this encounter.   Raylene Everts, MD 05/06/20 1302

## 2020-05-10 ENCOUNTER — Encounter: Payer: Self-pay | Admitting: Internal Medicine

## 2020-05-10 ENCOUNTER — Telehealth: Payer: Self-pay | Admitting: Internal Medicine

## 2020-05-10 ENCOUNTER — Other Ambulatory Visit: Payer: Self-pay

## 2020-05-10 ENCOUNTER — Ambulatory Visit (INDEPENDENT_AMBULATORY_CARE_PROVIDER_SITE_OTHER): Payer: Medicaid Other | Admitting: Internal Medicine

## 2020-05-10 DIAGNOSIS — J4530 Mild persistent asthma, uncomplicated: Secondary | ICD-10-CM

## 2020-05-10 MED ORDER — PREDNISONE 10 MG PO TABS: ORAL_TABLET | ORAL | 0 refills | Status: DC

## 2020-05-10 NOTE — Patient Instructions (Addendum)
Prednisone 10 mg take  4 each am x 2 days,   2 each am x 2 days,  1 each am x 2 days and stop     Plan B = Backup (to supplement plan A, not to replace it) Only use your albuterol (PROAIR)  inhaler as a rescue medication to be used if you can't catch your breath by resting or doing a relaxed purse lip breathing pattern.  - The less you use it, the better it will work when you need it. - Ok to use the inhaler up to 2 puffs  every 4 hours if you must but call for appointment if use goes up over your usual need - Don't leave home without it !!  (think of it like the spare tire for your car)   Plan C = Crisis (instead of Plan B but only if Plan B stops working) - only use your albuterol nebulizer if you first try Plan B and it fails to help > ok to use the nebulizer up to every 4 hours but if start needing it regularly call for immediate appointment     We will get you seen asap by [name] or one of our nurse practioners return asap with all medications /solutions

## 2020-05-10 NOTE — Assessment & Plan Note (Signed)
Onset of symptoms 2012 PFT's  11/10/2016  FEV1 2.06 (92 % ) ratio 81   p 24 % improvement from saba p ? prior to study with DLCO  93/94 % corrects to 102  % for alv volume   - 06/04/2017  After extensive coaching HFA effectiveness =    75% > try symbicort 80 2bid  - Spirometry 10/28/2017  FEV1 1.01 (45%)  Ratio 67  p am symb/saba/smoking  - 10/28/2017   try symb 160 2bid  - 03/04/2018  A try symb 80 2bid due to uacs component  - 04/01/18 try sym 160 2bid - Spirometry 07/04/2018  FEV1 1.1 (50%)  Ratio 69  Prior to rx - -  PFT's  07/20/18   FEV1 1.33 (60 % ) ratio 75  p 15 % improvement from saba p nothing prior to study with DLCO  81/78c % corrects to 104 % for alv volume  With ERV 6 and min curvature to f/v loop  And mild air trapping  - 02/13/2019 try off coreg and on bisoprolol - 09/18/2019    try change to bevespi 2bid > worse 05/10/2020 "hot weather"  No longer on maint rx  rec pred x 6 days and f/u asap with all meds in hand     Not really clear she's having a true flare so much as non-adherent during miserble weather so rx acutely with pred x 6 days then ov with all meds in hand using a trust but verify approach to confirm accurate Medication  Reconciliation The principal here is that until we are certain that the  patients are doing what we've asked, it makes no sense to ask them to do more.

## 2020-05-10 NOTE — Telephone Encounter (Signed)
Primary Pulmonologist: Dr Melvyn Novas Last office visit and with whom: Dr Melvyn Novas, 09/18/19 What do we see them for (pulmonary problems): Chronic Asthma Last OV assessment/plan:  Instructions  Prednisone 10 mg take  4 each am x 2 days,   2 each am x 2 days,  1 each am x 2 days and stop   Plan A = Automatic = Always=    Breztri  Take 2 puffs first thing in am and then another 2 puffs about 12 hours later.   Work on inhaler technique:  relax and gently blow all the way out then take a nice smooth deep breath back in, triggering the inhaler at same time you start breathing in.  Hold for up to 5 seconds if you can. Blow out thru nose. Rinse and gargle with water when done     Plan B = Backup (to supplement plan A, not to replace it) Only use your albuterol inhaler as a rescue medication to be used if you can't catch your breath by resting or doing a relaxed purse lip breathing pattern.  - The less you use it, the better it will work when you need it. - Ok to use the inhaler up to 2 puffs  every 4 hours if you must but call for appointment if use goes up over your usual need - Don't leave home without it !!  (think of it like the spare tire for your car)   Plan C = Crisis (instead of Plan B but only if Plan B stops working) - only use your albuterol nebulizer if you first try Plan B and it fails to help > ok to use the nebulizer up to every 4 hours but if start needing it regularly call for immediate appointment   Prednisone 10 mg take  4 each am x 2 days,   2 each am x 2 days,  1 each am x 2 days and stop   The key is to stop smoking completely before smoking completely stops you!  For smoking cessation classes call [phone]     Please schedule a follow up office visit in 4 weeks, sooner if needed  with all medications /inhalers/ solutions in hand so we can verify exactly what you are taking. This includes all medications from all doctors and over the counters       Reason for  call:   Called and spoke with Patient.  Patient stated she has had increased sob, cough, that started 3 days ago.  Patient stated she is able to check her O2sats, and they are 93-94% on RA.  Patient stated she is having a productive cough, with thick,gray colored sputum. Patient denies any fever, or chills.  Patient has been using her nebs, and albuterol inhaler with no improvement. Patient stated she is fully vaccinated, with both Pfizer covid vaccines. Patient request any prescriptions to go to [facility].  Allergies  Allergen Reactions  . Aspirin Nausea And Vomiting    Immunization History  Administered Date(s) Administered  . Influenza Inj Mdck Quad Pf 08/22/2018  . Influenza Split 06/07/2013, 06/15/2015  . Influenza,inj,Quad PF,6+ Mos 07/22/2017, 08/31/2019  . Pneumococcal Polysaccharide-23 04/03/2014, 08/31/2019  . Rabies, IM 05/04/2016, 05/12/2016    Message routed to Dr Melvyn Novas to advise

## 2020-05-10 NOTE — Telephone Encounter (Signed)
Checked scheduled. There are no appts here in [place] today. She has been scheduled as a televisit with MW today at 73. She is aware that he may call earlier or later in the day and is ok with this.   Nothing further needed at time of call.

## 2020-05-10 NOTE — Progress Notes (Signed)
Subjective:     Patient ID: Lori Bean, female   DOB: Sep 25, 1970      MRN: [id number]    Brief patient profile:  49 yobf MO active smoker  healthy child/ 2 IUP's  With baseline wt < 200 with new doe x 2012 assoc with wt gain gain x 2016 referred to pulmonary clinic 06/04/2017 by Dr   Vista Lawman with dx mild asthma ? Early copd   Admit date: 12/17/2016 Discharge date: 12/18/2016  Discharge Diagnoses:  Possible asthma exacerbation Essential hypertension Chronic diastolic heart failure Prediabetes Morbid obesity  History of present illness:  49 y.o.femalewith medical history significant of Asthma and OSA (reversibility demonstrated on PFTs in Feb this year), smoking, HTN, CHF grade 2 diastolic dysfunction. Patient presents to the ED with c/o SOB. Symptoms onset at rest yesterday, also has 10lb weight gain over past 3 weeks but states she is compliant with lasix 80 BID. 2 albuterol nebs before seeing PCP this AM. PCP sent her to ED for evaluation.  Hospital Course:  Possible asthma exacerbation -Patient states that her wheezing has actually improved and she is feeling better today -She recently finished a course of prednisone back in February 2018 -She has been following up with the pulmonary clinic -Chest x-ray -Spoke with Dr. [name], patient's pulmonologist. Of note, progress note from Dr. [name] back in January 2018 stated that he was not convinced of her asthma. At this time, Dr. [name] recommended no change in patient's medications. Follow-up as an outpatient. -Continue Advair Diskus, pro-air, QVAR -Patient was placed on prednisone 40 mg, will continue this for 4 days.  Essential hypertension -Continue amlodipine -Lasix was discontinued as she does complain of chronic cough  Chronic diastolic heart failure -Echocardiogram 05/24/2015 showed EF of 0263%, grade 2 diastolic dysfunction -Currently appears euvolemic and compensated on exam. -BNP 24.8 -Continue home Lasix  dose with potassium supplementation  Prediabetes -Hemoglobin A1c was 6.1 back in 2016. This can be followed and managed as an outpatient by patient's primary care physician.  Morbid obesity -BMI 47.1 -Patient only talk to her primary care physician regarding weight loss and lifestyle modifications. -Patient states she feels that she has gained 10-15 pounds in the past several weeks. States she has been watching what she eats. Admits that she has been using different scales at different doctors offices. Inquires about whether there is a program that offers free scales. Case management stated that this program is no longer available.       06/04/2017 1st   office visit/ Jannessa Ogden  Re transition of care  Chief Complaint  Patient presents with  . Pulmonary Consult    Referred by Dr. Vista Lawman. Pt c/o DOE for approx 6 years, worse over the past wk to the point that she is winded just walking from room to room at home. She states also having some prod cough with large amounts of black colored sputum.  She has CP "stabbing pain" comes and goes and lasts for several minutes. She has a rash on her arm- not improving after seeing derm. She has also noticed blurred vision. She is using her proair inhaler 3-4 x per day.   first started on proair helped some, then symbicort sev months prior to OV>  Not much response, even neb now doesn't always help. 6 years prior to OV  Trouble flat and x 6 months sleeping upright due to sob immediately on lying back Doe progressive   But esp over the last week to room  to room now Coughing more since 2 months > black mucus assoc with cp with coughing  rec Plan A = Automatic = symbicort 80 Take 2 puffs first thing in am and then another 2 puffs about 12 hours later.  Pantoprazole (protonix) 40 mg (or prilosec 20 x 2)    Take  30-60 min before first meal of the day and Pepcid (famotidine)  20 mg one @  bedtime until return to office - this is the best way to tell whether  stomach acid is contributing to your problem.   Plan B = Backup Only use your albuterol as a rescue medication Plan C = Nebulizer  GERD diet   07/22/18 NP eval [name]  Continue on Symbicort Twice daily  . Rinse after use.  Work on not smoking    10/28/2017  f/u ov/Sian Rockers re: sob/ ? How much is asthma / still smoking  Chief Complaint  Patient presents with  . Follow-up    Breathing has been worse for the past wk- SOB walking from room to room. She is also c/o wheezing and some chest tightness. She is using her albuterol inhaler 4-5 x per day on average.   Dyspnea: room to room x one week Cough: more than usual x one week thick clear/ assoc st and dysphagia ? On ppi ac ?  Sleep: ok on cpap s 02   Using saba q am instead of prn  Midline pain with coughing only  rec Plan A = Automatic = change symbicort to 160 Take 2 puffs first thing in am and then another 2 puffs about 12 hours later.  Work on inhaler technique:     Plan B = Backup Only use your albuterol as a rescue medication Plan C = Crisis - only use your albuterol nebulizer if you first try Plan B       03/04/2018  f/u ov/Skylarr Liz re: AB on symbicort 160/ acei/ coreg 25 bid  Chief Complaint  Patient presents with  . Follow-up    still SOB, sore throat, difficulty swallowing, productive cough   Dyspnea:  Room to room Cough: is worse at hs / like choking /  [place] mucus/ nose stuffy now all x 2 months on acei gradually worse  Sleep: on cpap with some cough/gag  SABA use:  None rec Plan A = Automatic = change symbicort to 80 Take 2 puffs first thing in am and then another 2 puffs about 12 hours later.  Work on inhaler technique:   Plan B = Backup Only use your albuterol as a rescue medication Plan C = Crisis - only use your albuterol nebulizer if you first try Plan B stop lisinopril and take avapro (ibesartan) 300 mg one daily in its place  Augmentin 875 mg take one pill twice daily  X 10 days - Prednisone 10 mg take  4 each am  x 2 days,   2 each am x 2 days,  1 each am x 2 days and stop  Please schedule a follow up office visit in 4 weeks, sooner if needed to see [name] NP but bring all meds with you      NP ov 04/01/18 rec Increase Symbicort 160/4.50mg 2 puffs Twice daily  , rinse after use.  Mucinex DM Twice daily  As needed  Cough/congestion  Work on not smoking .  Continue on BIPAP At bedtime   Low salt diet , keep legs elevated.  Follow up with Cardiology as discussed.  07/01/18 ER eval for sob Dx  chf  rx lasix     07/04/2018  Extended transition of care  f/u ov/Deanda Ruddell post er eval  re: copd/ab still smoking/ over using saba, did not use symb yet today and hfa poor / did not bring meds Chief Complaint  Patient presents with  . Follow-up    chronic asthma, patient uses albuterol TID  Dyspnea:  MMRC2 = can't walk a nl pace on a flat grade s sob but does fine slow and flat can shop HT/ [employer]  Cough: worse x sev weeks, better while on zpak worse off/ brown mucus 24/7 / assoc with overt HB Sleeping: on cpap, not bipap, bothered by cough / has not had any sleep f/u by [name] in years  SABA use: as above  rec Plan A = Automatic = symbicort 160 Take 2 puffs first thing in am and then another 2 puffs about 12 hours later.  Work on inhaler technique:    Plan B = Backup Only use your albuterol as a rescue medication  07/20/18  NP eval Start Zyrtec- take daily during allergy season or until symptoms improve  Start Mucinex twice daily for congestion  Flutter valve three times a day for congestion  Continue Symbicort 2 puffs twice a day (every day) Back up- Albuterol rescue inhaler 2 puffs every 4-6 hours as needed only for sob/wheezing     08/17/18 NP rec Pulmonary function test showed some restriction and mild obstruction consistent with early COPD - likely due to smoking and weight  Continue to work on quitting smoking  Adding Singulair, take once daily at bedtime- this is to help with allergy  symptoms and prevent asthma exacerbations (sent in RX) Adding Spiriva, take 2 puffs daily (given sample, RX sent to pharmacy) Continue Symbicort 160 - 2 puffs twice a day  Nicotine patch (RX sent)      11/21/2018  f/u ov/Tamorah Hada re: copd/ ab  Still smoking/ maint on symb/spiriva  Chief Complaint  Patient presents with  . Follow-up    feeling about the same - cough (prod-brownish)    Dyspnea:  Room to room / does [employer] once week able to do an aisle leaning on cart = MMRC3 = can't walk 100 yards even at a slow pace at a flat grade s stopping due to sob   Cough: worse than usual x 2 weeks  When lies down with slt brown color, thicker than usual  Sleeping: on back 3 pillows and cpap  SABA use: not very helpful though hfa poor  02: none rec Zpak, No change in other medications Work on inhaler technique:  relax and gently blow all the way out then take a nice smooth deep breath back in, triggering the inhaler at same time you start breathing in.  Hold for up to 5 seconds if you can. Blow out thru nose. Rinse and gargle with water when done Only use your albuterol as a rescue medication to be used The key is to stop smoking completely before smoking completely stops you! - For smoking cessation classes call [phone]    Please schedule a follow up visit in 3 months but call sooner if needed and bring your inhalers with you  Add:  May need trial off coreg and on bisoprolol      02/13/2019  f/u ov/Delmi Fulfer re: copd/ab did not bring all  Inhalers / still on coreg 25 bid Chief Complaint  Patient presents with  . Follow-up  Chronic Asthma - patient stated medications are working for her, no issues.  Dyspnea:  Does [employer] once a week leaning on cart/ crosses parking lot  Cough: none  Sleeping: on bed / flat with 3 pillows / with cpap  SABA use: less than once a week 02: none rec Stop the coreg and start bisoprolol 5 gm one pill twice daily  Work on inhaler technique The key is to stop  smoking completely before smoking completely stops you!   Admit date: 08/28/2019 Discharge date: 08/31/2019    Recommendations for Outpatient Follow-up:  1. Complete azithromycin prednisone as an outpatient 2. New start to 70/30 insulin twice a day and Amaryl-patient cautioned on checking blood sugars at least twice a day with instructions given-supplies pen and pen needles given 3. Patient discharged with 3 in 1 commode on discharge 4. Some medications have changed please note changes including discontinuation ARB and Maxide  Discharge Diagnoses:  Active Problems:   Essential hypertension   Bipolar affective disorder (HCC)   GERD (gastroesophageal reflux disease)   Chronic diastolic heart failure (HCC)   OSA (obstructive sleep apnea)   Lymphedema   COPD with acute exacerbation ([place])   COPD exacerbation ([place])   Discharge Condition: Improved  Diet recommendation: Heart healthy       Filed Weights   08/30/19 0400 08/30/19 1800 08/30/19 2100  Weight: 125.8 kg 125.8 kg 125.7 kg    History of present illness:  49blk Femasthma/OSA, prior smoker, HTN, HFpEF EF 60-65% 04/14/2018 PASP 22-chronic back pain status post lumbar fusion 12/28/2013, venous thrombosis superficial 2013 schizophrenia Admitted 12/16 with 2 weeks thick productive sputum Rx bronchitis Z-Pak exposed to son who tested positive for Covid Rapid Covid test negative PCR test negative-repeat test 12:16 AM also negative  Hospital Course:  Baseline weight 05/01/2019 was 122kgon 02/14/2019 then went to 124 kg 05/01/2019 finally was 125.6 kg at cardiology office visit on admission 125.8   Assessment & Plan: Coronavirus negative x3 No further precautions transition to regular telemetry unit--biomarkers are not indicative acute inflammatory process arguing further against this however she will need to self isolate from her family if they are positive and figure this out in the outpatient setting Acute hypoxic  respiratory failure Combination of probable acute bronchitis + heart failure Rx Solu-Medrol every 12/14 given prednisone 40 to finish off a burst Home dose Lasix 80 twice daily changed to 60 IV twice daily 12/16-was changed to home dose 80 mg twice a day on discharge should take an extra 80 mg if he gains more than 2 kg in 24 hours Continue azithromycin until 12/19 HTN HFpEF Continue bisoprolol 5 twice daily, resume amlodipine 10-this admission discontinued Maxide daily, Diovan 80 daily and this may need to be reimplemented OSA Resume during hospital stay 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 Stable and understands how to care for self medications-meter ordered in addition to supplies Bipolar Continue Abilify 20 at bedtime, Cymbalta 30 twice daily   09/18/2019  Post hosp  f/u ov/Sloka Volante re:  AB/ did not bring meds  Chief Complaint  Patient presents with  . Follow-up    SOB, cough, congestion, mucus is grey  baseline taking symb/spiriva daily and with activity just once  Then gradually worse for a week prior to admit and did not call  Dyspnea:  Now riding scooter /  parks as close as she can  Cough: mostly hs / min mucoid  Sleeping: cough wakes her up / water helps  SABA use: using hfa only, up to twice on avg no longer neb  02: none  rec Prednisone 10 mg take  4 each am x 2 days,   2 each am x 2 days,  1 each am x 2 days and stop  Plan A = Automatic = Always=    Breztri  Take 2 puffs first thing in am and then another 2 puffs about 12 hours later.  Work on inhaler technique:   Plan B = Backup (to supplement plan A, not to replace it) Only use your albuterol inhaler as a rescue medication Plan C = Crisis (instead of Plan B but only if Plan B stops working) - only use your albuterol nebulizer if you first try  Plan B  The key is to stop smoking completely before smoking completely stops you!   Virtual Visit via Telephone Note 05/10/2020  - fully vaccinated for covid April 2021  Cc increased sob, cough, that started 3 days ago.  Patient stated she is able to check her O2sats, and they are 93-94% on RA.  Patient stated she is having a productive cough, with thick,gray colored sputum. Patient denies any fever, or chills.  Patient has been using her nebs, and albuterol inhaler with no improvement. Patient stated she is fully vaccinated, with both Pfizer covid vaccines.  I connected with Aarushi Hemric Ferraz on 05/10/20 at 10 20  am   by telephone and verified that I am speaking with the correct person using two identifiers. Pt is at home and this call made from my office with no other participants on the call.    I discussed the limitations, risks, security and privacy concerns of performing an evaluation and management service by telephone and the availability of in person appointments. I also discussed with the patient that there may be a patient responsible charge related to this service. The patient expressed understanding and agreed to proceed.   History of Present Illness:  Dyspnea:  Worse with hot weather, no a/c at home Cough:  Min slt grey Sleeping: 6 pillows / bed is flat SABA use: confused thoroughly with maint vs prn  02: none Still smoking  Leg swelling no better    No obvious day to day or daytime variability or assoc excess/ purulent sputum or mucus plugs or hemoptysis or cp or chest tightness, subjective wheeze or overt sinus or hb symptoms.    Also denies any obvious fluctuation of symptoms with weather or environmental changes or other aggravating or alleviating factors except as outlined above.   Meds reviewed/ med reconciliation completed     No outpatient medications have been marked as taking for the 05/10/20 encounter (Office Visit) with Tanda Rockers, MD.          Observations/Objective: No conversational sob, good voice texture, no spont cough / thoroughly confused with details of care.   Assessment and Plan: See problem list for active a/p's   Follow Up Instructions: See avs for instructions unique to this ov which includes revised/ updated med list     I discussed the assessment and treatment plan with the patient. The patient was provided an opportunity to ask questions and all were answered. The patient agreed with the plan and demonstrated an understanding of the instructions.   The patient was advised to call back or seek an in-person evaluation  if the symptoms worsen or if the condition fails to improve as anticipated.  I provided 23 minutes of non-face-to-face time during this encounter.   Christinia Gully, MD

## 2020-05-10 NOTE — Telephone Encounter (Signed)
If can't see NP today then televisit with NP or me  (add on and tell her I'll call when I get free)

## 2020-05-13 ENCOUNTER — Ambulatory Visit (INDEPENDENT_AMBULATORY_CARE_PROVIDER_SITE_OTHER): Payer: Medicaid Other

## 2020-05-13 ENCOUNTER — Ambulatory Visit (HOSPITAL_COMMUNITY)
Admission: EM | Admit: 2020-05-13 | Discharge: 2020-05-13 | Disposition: A | Discharge status: Home / Self Care | Payer: Medicaid Other | Attending: Family Medicine | Admitting: Family Medicine

## 2020-05-13 ENCOUNTER — Encounter (HOSPITAL_COMMUNITY): Payer: Self-pay

## 2020-05-13 ENCOUNTER — Other Ambulatory Visit: Payer: Self-pay

## 2020-05-13 DIAGNOSIS — E119 Type 2 diabetes mellitus without complications: Secondary | ICD-10-CM | POA: Diagnosis not present

## 2020-05-13 DIAGNOSIS — N3281 Overactive bladder: Secondary | ICD-10-CM | POA: Diagnosis not present

## 2020-05-13 DIAGNOSIS — J449 Chronic obstructive pulmonary disease, unspecified: Secondary | ICD-10-CM | POA: Insufficient documentation

## 2020-05-13 DIAGNOSIS — Z8249 Family history of ischemic heart disease and other diseases of the circulatory system: Secondary | ICD-10-CM | POA: Diagnosis not present

## 2020-05-13 DIAGNOSIS — F17210 Nicotine dependence, cigarettes, uncomplicated: Secondary | ICD-10-CM | POA: Diagnosis not present

## 2020-05-13 DIAGNOSIS — F250 Schizoaffective disorder, bipolar type: Secondary | ICD-10-CM | POA: Insufficient documentation

## 2020-05-13 DIAGNOSIS — I890 Lymphedema, not elsewhere classified: Secondary | ICD-10-CM | POA: Insufficient documentation

## 2020-05-13 DIAGNOSIS — R05 Cough: Secondary | ICD-10-CM

## 2020-05-13 DIAGNOSIS — Z794 Long term (current) use of insulin: Secondary | ICD-10-CM | POA: Insufficient documentation

## 2020-05-13 DIAGNOSIS — M47816 Spondylosis without myelopathy or radiculopathy, lumbar region: Secondary | ICD-10-CM | POA: Insufficient documentation

## 2020-05-13 DIAGNOSIS — C029 Malignant neoplasm of tongue, unspecified: Secondary | ICD-10-CM | POA: Diagnosis not present

## 2020-05-13 DIAGNOSIS — Z20822 Contact with and (suspected) exposure to covid-19: Secondary | ICD-10-CM | POA: Diagnosis not present

## 2020-05-13 DIAGNOSIS — Z886 Allergy status to analgesic agent status: Secondary | ICD-10-CM | POA: Insufficient documentation

## 2020-05-13 DIAGNOSIS — I5032 Chronic diastolic (congestive) heart failure: Secondary | ICD-10-CM | POA: Diagnosis not present

## 2020-05-13 DIAGNOSIS — R062 Wheezing: Secondary | ICD-10-CM

## 2020-05-13 DIAGNOSIS — I110 Hypertensive heart disease with heart failure: Secondary | ICD-10-CM | POA: Diagnosis not present

## 2020-05-13 DIAGNOSIS — Z7952 Long term (current) use of systemic steroids: Secondary | ICD-10-CM | POA: Insufficient documentation

## 2020-05-13 DIAGNOSIS — F419 Anxiety disorder, unspecified: Secondary | ICD-10-CM | POA: Diagnosis not present

## 2020-05-13 DIAGNOSIS — J4530 Mild persistent asthma, uncomplicated: Secondary | ICD-10-CM | POA: Insufficient documentation

## 2020-05-13 DIAGNOSIS — K219 Gastro-esophageal reflux disease without esophagitis: Secondary | ICD-10-CM | POA: Insufficient documentation

## 2020-05-13 DIAGNOSIS — Z79899 Other long term (current) drug therapy: Secondary | ICD-10-CM | POA: Diagnosis not present

## 2020-05-13 DIAGNOSIS — J069 Acute upper respiratory infection, unspecified: Secondary | ICD-10-CM | POA: Diagnosis not present

## 2020-05-13 DIAGNOSIS — G4733 Obstructive sleep apnea (adult) (pediatric): Secondary | ICD-10-CM | POA: Diagnosis not present

## 2020-05-13 DIAGNOSIS — Z9049 Acquired absence of other specified parts of digestive tract: Secondary | ICD-10-CM | POA: Insufficient documentation

## 2020-05-13 DIAGNOSIS — Z833 Family history of diabetes mellitus: Secondary | ICD-10-CM | POA: Diagnosis not present

## 2020-05-13 DIAGNOSIS — R0602 Shortness of breath: Secondary | ICD-10-CM | POA: Diagnosis not present

## 2020-05-13 LAB — SARS CORONAVIRUS 2 (TAT 6-24 HRS): SARS Coronavirus 2: NEGATIVE

## 2020-05-13 MED ORDER — BENZONATATE 100 MG PO CAPS: 100.0000 mg | ORAL_CAPSULE | Freq: Three times a day (TID) | ORAL | Status: DC | PRN

## 2020-05-13 MED ORDER — BENZONATATE 100 MG PO CAPS
100.0000 mg | ORAL_CAPSULE | Freq: Four times a day (QID) | ORAL | 0 refills | Status: DC | PRN
Start: 2020-05-13 — End: 2020-05-22

## 2020-05-13 NOTE — ED Triage Notes (Signed)
Pt is here with a cough that started 3 days ago, pt has not taken any meds to relieve discomfort. Pt states she has congested heart failure and that she always has SOB.

## 2020-05-13 NOTE — ED Provider Notes (Signed)
[place]    CSN: [id number] Arrival date & time: 05/13/20  1751      History   Chief Complaint Chief Complaint  Patient presents with  . Cough    HPI Lori Bean is a 49 y.o. female with past medical history of CHF, HTN, chronic asthma, lymphedema presents to urgent care with 3-day history of cough.  Patient describes cough productive of thick, gray mucus.  Also with some nasal congestion.  She denies any recent fever/chills, chest pain, N/V, chronic diarrhea is unchanged.  Patient describes chronic orthopnea that is unchanged as she continues to sleep on 6 pillows each evening.  Patient also reports chronic lymphedema in lower extremities that is unchanged.  She does not weigh herself regularly and does not know her baseline weight.  Sister and nephew with recent Covid diagnosis.  She is fully vaccinated.   Past Medical History:  Diagnosis Date  . Abdominal hernia   . Anginal pain ([place])    occ from asthma  . Anxiety   . Arthritis   . Asthma   . Bipolar affective disorder ([place])    Schizophrenia  . Bronchitis    hx of  . CHF (congestive heart failure) ([place])   . Depression   . Diabetes mellitus without complication ([place])    " New Onset " per patient  . GERD (gastroesophageal reflux disease)   . Heart murmur   . Hypertension    takes meds  . Lymphedema 06/07/2018  . Morbid (severe) obesity due to excess calories ([place])   . Neuropathy    left leg , "from back surgery"  . Overactive bladder   . Schizophrenia ([place])   . Sciatica   . Shortness of breath   . Sleep apnea   . Snoring disorder    Pt stated my boyfriend always wakes me up and tells me to breathe    Patient Active Problem List   Diagnosis Date Noted  . Acute on chronic respiratory failure ([place]) 09/01/2019  . COPD with acute exacerbation ([place]) 08/28/2019  . COPD exacerbation ([place]) 08/28/2019  . Acute bronchitis 08/16/2019  . Tongue cancer ([place]) 02/14/2019  . COPD mixed type ([place])  08/17/2018  . Sinusitis 07/22/2018  . Lymphedema 06/07/2018  . Abnormal uterine bleeding (AUB) 12/12/2017  . CHF (congestive heart failure) ([place])   . Chronic asthma, mild persistent, uncomplicated 02/58/5277  . Dyspnea on exertion 05/05/2017  . Asthma exacerbation in COPD ([place]) 05/05/2017  . Morbid obesity due to excess calories ([place]) 05/05/2017  . Asthma exacerbation 12/17/2016  . 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 Synovial plica of right knee 82/42/3536  . Tear of medial meniscus of right knee, initial encounter 09/28/2016  . OSA (obstructive sleep apnea) 09/23/2016  . Chronic diastolic heart failure ([place]) 04/22/2016  . Prediabetes 04/22/2016  . Cigarette smoker 04/03/2014  . Chest pain 04/03/2014  . Lumbar spondylosis 12/28/2013  . Extrinsic asthma 06/07/2013  . Anxiety   . Essential hypertension   . Bipolar affective disorder ([place])   . GERD (gastroesophageal reflux disease)   . Arthritis   . Schizophrenia ([place])   . Overactive bladder     Past Surgical History:  Procedure Laterality Date  . BACK SURGERY     3 back surgeries  . CHOLECYSTECTOMY    . COLONOSCOPY WITH PROPOFOL N/A 01/23/2016   Procedure: COLONOSCOPY WITH PROPOFOL;  Surgeon: Wonda Horner, MD;  Location: [facility];  Service: Endoscopy;  Laterality: N/A;  . EYE SURGERY  Metal plate in right eye. Had fracture in right eye  . gallstones reomved    . KNEE ARTHROSCOPY WITH MENISCAL REPAIR Right 09/28/2016   Procedure: KNEE ARTHROSCOPY WITH MENISCAL REPAIR;  Surgeon: Dorna Leitz, MD;  Location: [facility];  Service: Orthopedics;  Laterality: Right;  Right partial meniscectomy and chondroplasty, patellar/femoral joint and medial femoral condyle   . LUMBAR LAMINECTOMY/DECOMPRESSION MICRODISCECTOMY  04/01/2012   Procedure: LUMBAR LAMINECTOMY/DECOMPRESSION MICRODISCECTOMY 2 LEVELS;  Surgeon: Faythe Ghee, MD;  Location: [facility];  Service: Neurosurgery;  Laterality: Left;  Lumbar four-five, lumbar five sacral  one microdiscectomy   . LUMBAR WOUND DEBRIDEMENT  04/29/2012   Procedure: LUMBAR WOUND DEBRIDEMENT;  Surgeon: Faythe Ghee, MD;  Location: [facility];  Service: Neurosurgery;  Laterality: N/A;  lumbar wound debridement  . ROTATOR CUFF REPAIR     Right shoulder  . TUBAL LIGATION      OB History    Gravida  4   Para  2   Term  0   Preterm  2   AB  2   Living  2     SAB  1   TAB  1   Ectopic      Multiple      Live Births  2            Home Medications    Prior to Admission medications   Medication Sig Start Date End Date Taking? Authorizing Provider  albuterol (PROAIR HFA) 108 (90 Base) MCG/ACT inhaler Inhale 2 puffs into the lungs every 4 (four) hours as needed for wheezing or shortness of breath.    [provider]  albuterol (PROVENTIL) (2.5 MG/3ML) 0.083% nebulizer solution Take 2.5 mg by nebulization every 4 (four) hours as needed for wheezing or shortness of breath.    [provider]  albuterol (VENTOLIN HFA) 108 (90 Base) MCG/ACT inhaler albuterol sulfate HFA 90 mcg/actuation aerosol inhaler  INHALE 2 PUFFS BY MOUTH FOUR TIMES DAILY    [provider]  amLODipine (NORVASC) 10 MG tablet Take 1 tablet (10 mg total) by mouth daily. 08/31/19 05/27/20  Nita Sells, MD  ARIPiprazole (ABILIFY) 20 MG tablet Take 1 tablet (20 mg total) by mouth at bedtime. 03/04/15   Lance Bosch, NP  benzonatate (TESSALON PERLES) 100 MG capsule Take 1 capsule (100 mg total) by mouth every 6 (six) hours as needed for cough. 05/13/20 05/13/21  Rudolpho Sevin, NP  bisoprolol (ZEBETA) 5 MG tablet One twice daily 01/23/20   Erlene Quan, PA-C  blood glucose meter kit and supplies KIT Dispense based on patient and insurance preference. Use up to four times daily as directed. (FOR ICD-9 250.00, 250.01). 08/31/19   Nita Sells, MD  Budeson-Glycopyrrol-Formoterol (BREZTRI AEROSPHERE) 160-9-4.8 MCG/ACT AERO Inhale 2 puffs into the lungs 2 (two)  times daily. 09/18/19   Tanda Rockers, MD  fluticasone (CUTIVATE) 0.05 % cream fluticasone propionate 0.05 % topical cream  APPLY EXTERNALLY TO THE AFFECTED AREA TWICE DAILY FOR 14 DAYS    [provider]  furosemide (LASIX) 80 MG tablet Take $RemoveBef'80mg'WMcPqwEnBr$  in the morning and $RemoveBef'40mg'LaordXGJMf$  in the evening. Patient taking differently: 80 mg 2 (two) times daily. Take $RemoveBef'80mg'lWyzzWhyuh$  in the morning and $RemoveBef'80mg'jDhLRjrzbN$  in the evening. 09/26/19   Erlene Quan, PA-C  gabapentin (NEURONTIN) 300 MG capsule Take 300 mg by mouth 3 (three) times daily.  02/02/19   [provider]  glimepiride (AMARYL) 1 MG tablet Take 1 tablet (1 mg total)  by mouth daily with breakfast. 08/31/19   Nita Sells, MD  glipiZIDE (GLUCOTROL XL) 10 MG 24 hr tablet glipizide ER 10 mg tablet, extended release 24 hr  TAKE 1 TABLET BY MOUTH EVERY DAY    [provider]  glipiZIDE (GLUCOTROL XL) 5 MG 24 hr tablet glipizide ER 5 mg tablet, extended release 24 hr  TAKE 1 TABLET BY MOUTH EVERY DAY    [provider]  HYDROcodone-acetaminophen (NORCO/VICODIN) 5-325 MG tablet Take 1-2 tablets by mouth See admin instructions. Take 2 tablets by mouth daily at bedtime, may also take 1-2 tablets every 4 hours as needed for pain 02/01/19   [provider]  HYDROcodone-acetaminophen (NORCO/VICODIN) 5-325 MG tablet hydrocodone 5 mg-acetaminophen 325 mg tablet  Take 1 tablet every 6 hours by oral route.    [provider]  HYDROcodone-acetaminophen (NORCO/VICODIN) 5-325 MG tablet Take 1-2 tablets by mouth every 6 (six) hours as needed. 05/06/20   Raylene Everts, MD  hydrOXYzine (VISTARIL) 25 MG capsule hydroxyzine pamoate 25 mg capsule  TAKE 1 CAPSULE BY MOUTH EVERY NIGHT AT BEDTIME    [provider]  insulin aspart protamine - aspart (NOVOLOG 70/30 MIX) (70-30) 100 UNIT/ML FlexPen Inject 0.18 mLs (18 Units total) into the skin 2 (two) times daily with a meal. Patient taking differently: Inject 18 Units into the skin  3 (three) times daily with meals.  08/31/19   Nita Sells, MD  Insulin Pen Needle (NOVOFINE) 30G X 8 MM MISC Inject 10 each into the skin as needed. 08/31/19   Nita Sells, MD  ipratropium (ATROVENT HFA) 17 MCG/ACT inhaler Atrovent HFA 17 mcg/actuation aerosol inhaler  INHALE 2 PUFFS INTO THE LUNGS EVERY 6 HOURS    [provider]  metFORMIN (GLUCOPHAGE) 1000 MG tablet metformin 1,000 mg tablet  TAKE 1 TABLET BY MOUTH TWICE DAILY    [provider]  montelukast (SINGULAIR) 10 MG tablet Take 1 tablet (10 mg total) by mouth at bedtime. 09/20/19   Martyn Ehrich, NP  omeprazole (PRILOSEC) 20 MG capsule omeprazole 20 mg capsule,delayed release  TAKE 1 CAPSULE BY MOUTH EVERY DAY    [provider]  omeprazole (PRILOSEC) 40 MG capsule Take 1 capsule (40 mg total) by mouth daily. 12/14/17   [name], MD  potassium chloride (KLOR-CON) 10 MEQ tablet potassium chloride ER 10 mEq tablet,extended release  TAKE 1 TABLET BY MOUTH TWICE DAILY    [provider]  potassium chloride SA (K-DUR,KLOR-CON) 20 MEQ tablet Take 40 mEq by mouth 2 (two) times daily.     [provider]  predniSONE (DELTASONE) 10 MG tablet Take  4 each am x 2 days,   2 each am x 2 days,  1 each am x 2 days and stop 05/10/20   Tanda Rockers, MD  PRESCRIPTION MEDICATION Inhale into the lungs at bedtime. CPAP    [provider]  Respiratory Therapy Supplies (FLUTTER) DEVI Use flutter device 3 times a day 07/20/18   Martyn Ehrich, NP  Triamcinolone Acetonide 0.025 % LOTN Apply 1 application topically 3 (three) times daily.    [provider]  triamterene-hydrochlorothiazide (MAXZIDE-25) 37.5-25 MG tablet triamterene 37.5 mg-hydrochlorothiazide 25 mg tablet  TAKE 1 TABLET BY MOUTH DAILY    [provider]  valsartan (DIOVAN) 160 MG tablet Take 1 tablet (160 mg total) by mouth daily. 09/01/19   Erlene Quan, PA-C  valsartan (DIOVAN) 80 MG  tablet valsartan 80 mg tablet  TAKE 1 TABLET  BY MOUTH EVERY DAY    [provider]    Family History Family History  Problem Relation Age of Onset  . Diabetes Mother   . Hypertension Mother   . Diabetes Father   . Heart disease Paternal Aunt   . Cancer Paternal Aunt     Social History Social History   Tobacco Use  . Smoking status: Current Every Day Smoker    Packs/day: 1.00    Years: 24.00    Pack years: 24.00    Types: Cigarettes  . Smokeless tobacco: Never Used  Vaping Use  . Vaping Use: Never used  Substance Use Topics  . Alcohol use: No    Alcohol/week: 0.0 standard drinks    Comment: quit Nov. 2014  . Drug use: No     Allergies   Aspirin   Review of Systems As stated in HPI otherwise negative  Physical Exam Triage Vital Signs ED Triage Vitals  Enc Vitals Group     BP 05/13/20 0902 (!) 164/96     Pulse Rate 05/13/20 0902 94     Resp 05/13/20 0902 19     Temp 05/13/20 0902 98.6 F (37 C)     Temp Source 05/13/20 0902 Oral     SpO2 05/13/20 0902 98 %     Weight 05/13/20 0901 263 lb (119.3 kg)     Height --      Head Circumference --      Peak Flow --      Pain Score 05/13/20 0901 0     Pain Loc --      Pain Edu? --      Excl. in [place]? --    No data found.  Updated Vital Signs BP (!) 164/96 (BP Location: Right Arm)   Pulse 94   Temp 98.6 F (37 C) (Oral)   Resp 19   Wt 119.3 kg   LMP 10/15/2018 Comment: patient had pregnancy test with PCP (test was negative)  SpO2 98%   BMI 48.10 kg/m      Physical Exam Constitutional:      General: She is not in acute distress.    Appearance: Normal appearance. She is obese. She is not ill-appearing.  HENT:     Right Ear: Tympanic membrane normal.     Left Ear: Tympanic membrane normal.     Nose: Congestion present.     Mouth/Throat:     Mouth: Mucous membranes are moist.     Pharynx: Oropharynx is clear. No oropharyngeal exudate or posterior oropharyngeal erythema.  Eyes:     General:  No scleral icterus.    Extraocular Movements: Extraocular movements intact.  Cardiovascular:     Rate and Rhythm: Normal rate and regular rhythm.     Comments: Chronic lymphedema bilateral lower extremities.  No pitting edema Pulmonary:     Effort: Pulmonary effort is normal.     Breath sounds: Normal breath sounds.     Comments: No wheezes, rales or crackles.  Patient with decreased inspiratory effort.  Diminished lung sounds bilateral lower lobes Abdominal:     General: Bowel sounds are normal.     Palpations: Abdomen is soft.     Tenderness: There is no abdominal tenderness.  Musculoskeletal:     Cervical back: Normal range of motion and neck supple.  Skin:    General: Skin is warm and dry.  Neurological:     General: No focal deficit present.     Mental Status: She is alert  and oriented to person, place, and time.  Psychiatric:        Mood and Affect: Mood normal.        Behavior: Behavior normal.      UC Treatments / Results  Labs (all labs ordered are listed, but only abnormal results are displayed) Labs Reviewed  SARS CORONAVIRUS 2 (TAT 6-24 HRS)    EKG   Radiology No results found.  Procedures Procedures (including critical care time)  Medications Ordered in UC Medications - No data to display  Initial Impression / Assessment and Plan / UC Course  I have reviewed the triage vital signs and the nursing notes.  Pertinent labs & imaging results that were available during my care of the patient were reviewed by me and considered in my medical decision making (see chart for details).  Cough, nasal congestion -Suspect viral etiology.  VSS, nontoxic appearing -No evidence of pneumonia or CHF exacerbation on chest x-ray or on physical exam -Check Covid 19 PCR.  Isolation precautions discussed pending test results -Patient has previously scheduled appointment with PCP on 9/1.  Instructed to return or go to ER for any worsening symptoms, SOB or chest pain  Final  Clinical Impressions(s) / UC Diagnoses   Final diagnoses:  Viral URI with cough     Discharge Instructions     Take cough suppressant as prescribed.  Your Covid test results should be available within the next 24 to 48 hours.  You will need to go to the ER for any increasing shortness of breath, chest pain or fever.  Keep your appointment with your primary care physician on Wednesday.    ED Prescriptions    Medication Sig Dispense Auth. Provider   benzonatate (TESSALON PERLES) 100 MG capsule Take 1 capsule (100 mg total) by mouth every 6 (six) hours as needed for cough. 30 capsule Rudolpho Sevin, NP     PDMP not reviewed this encounter.   Rudolpho Sevin, NP 05/15/20 1038

## 2020-05-13 NOTE — Discharge Instructions (Addendum)
Take cough suppressant as prescribed.  Your Covid test results should be available within the next 24 to 48 hours.  You will need to go to the ER for any increasing shortness of breath, chest pain or fever.  Keep your appointment with your primary care physician on Wednesday.

## 2020-05-22 ENCOUNTER — Ambulatory Visit: Payer: Medicaid Other | Admitting: Nurse Practitioner

## 2020-05-22 ENCOUNTER — Other Ambulatory Visit (INDEPENDENT_AMBULATORY_CARE_PROVIDER_SITE_OTHER): Payer: Medicaid Other

## 2020-05-22 ENCOUNTER — Encounter: Payer: Self-pay | Admitting: Nurse Practitioner

## 2020-05-22 VITALS — BP 138/84 | HR 94 | Ht 62.0 in | Wt 266.0 lb

## 2020-05-22 DIAGNOSIS — E8889 Other specified metabolic disorders: Secondary | ICD-10-CM

## 2020-05-22 DIAGNOSIS — E7889 Other lipoprotein metabolism disorders: Secondary | ICD-10-CM

## 2020-05-22 DIAGNOSIS — K529 Noninfective gastroenteritis and colitis, unspecified: Secondary | ICD-10-CM

## 2020-05-22 DIAGNOSIS — R1013 Epigastric pain: Secondary | ICD-10-CM | POA: Diagnosis not present

## 2020-05-22 LAB — CBC
HCT: 40.6 % (ref 36.0–46.0)
Hemoglobin: 13.7 g/dL (ref 12.0–15.0)
MCHC: 33.7 g/dL (ref 30.0–36.0)
MCV: 93.1 fl (ref 78.0–100.0)
Platelets: 241 10*3/uL (ref 150.0–400.0)
RBC: 4.36 Mil/uL (ref 3.87–5.11)
RDW: 14.6 % (ref 11.5–15.5)
WBC: 10.9 10*3/uL — ABNORMAL HIGH (ref 4.0–10.5)

## 2020-05-22 LAB — COMPREHENSIVE METABOLIC PANEL
ALT: 31 U/L (ref 0–35)
AST: 11 U/L (ref 0–37)
Albumin: 4 g/dL (ref 3.5–5.2)
Alkaline Phosphatase: 77 U/L (ref 39–117)
BUN: 12 mg/dL (ref 6–23)
CO2: 27 mEq/L (ref 19–32)
Calcium: 9.3 mg/dL (ref 8.4–10.5)
Chloride: 104 mEq/L (ref 96–112)
Creatinine, Ser: 0.58 mg/dL (ref 0.40–1.20)
GFR: 133.06 mL/min (ref >60.00)
Glucose, Bld: 118 mg/dL — ABNORMAL HIGH (ref 70–99)
Potassium: 3.9 mEq/L (ref 3.5–5.1)
Sodium: 140 mEq/L (ref 135–145)
Total Bilirubin: 0.3 mg/dL (ref 0.2–1.2)
Total Protein: 7.1 g/dL (ref 6.0–8.3)

## 2020-05-22 LAB — LIPASE: Lipase: 23 U/L (ref 11.0–59.0)

## 2020-05-22 MED ORDER — DIPHENOXYLATE-ATROPINE 2.5-0.025 MG PO TABS
1.0000 | ORAL_TABLET | Freq: Two times a day (BID) | ORAL | 3 refills | Status: DC
Start: 2020-05-22 — End: 2020-06-30

## 2020-05-22 NOTE — Progress Notes (Signed)
ASSESSMENT AND PLAN    # Chronic diarrhea, worse over the last few months --negative colonoscopy with biopsies for diarrhea in 2017 [facility]) --Has had antibiotics in last few months. Will stool for C-diff --Check stool lactoferrin --Lomotil BID. Hold for constipation --At some point consider trial of holding metformin --follow up with me in clinic after EGD  # Epigastric pain ( ache) --Negative CT scan --obtain lipase, LFTs,CBC --Arrange for EGD. The risks and benefits of EGD were discussed and the patient agrees to proceed.   # GERD?  Says she has taken [place] for years for burning in stomach. Never had typical GERD symptoms --Still has frequent epigastric burning with certain foods --Further evaluation at time of EGD.    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    HISTORY OF PRESENT ILLNESS     Primary Gastroenterologist :New patient Chief Complaint : upper abdominal pain  Lori Bean is a 49 y.o. female with PMH / [place] significant for,  but not necessarily limited to: Steatosis, diastolic heart failure,diabetes hypertension, COPD, OSA, asthma, lymphedema, diverticulitis ( 2020), headaches, cholecystectomy. She takes multiple medications.   Patient is new to the practice, referred by Raelyn Number, PA for epigastric pain. The aching pain has been present for a year but getting worse.. Pain seems to radiates through to her back but hard to know given history of back problems.  Says it hurts to touch the upper abdomen. Feels like she has been kicked in the stomach. Pain nearly constant. Eating doesn't make pain better nor worse. Heating pad help. Had knee injury and was followed by Pain Management but stopped it in June to go to take care of ill father. Back on hydrocodone for 2-3 weeks now and feels like that has helped the epigastric  pain the most. She complains of occasional nausea / vomiting for 3 months which actually was much worse when it started. Now just occasional nausea without vomiting. Weight is stable. CT scan mid August unrevealing.   Patient has chronic diarrhea. Stools are like mud. Dairy doesn't exacerbate the diarrhea. She hasn't tried imodium or anything else.  No solid stools in years.  Seems like over the last few months stools have been more frequent, up from baseline of 5 times / day to 10 times a day. She has nocturnal stooling. She took antibiotics in Feb and again in July.   Data Reviewed: January 2021 BUN 32, creatinine 0.9  04/26/20 CT scan  abd/ pelvis with contrast for evaluation of N/D and abdominal pain.  --Moderate to severe stenosis  Previous Endoscopic Evaluations / Pertinent Studies:  May 2017 colonoscopy by [facility] for evaluation of diarrhea --3 mm hyperplastic polyp --Exam otherwise normal. Colon biopsies negative.   Past Medical History:  Diagnosis Date  . Abdominal hernia   . Anginal pain ([place])    occ from asthma  . Anxiety   . Arthritis   . Asthma   . Bipolar affective disorder ([place])    Schizophrenia  . Bronchitis    hx of  . CHF (congestive heart failure) ([place])   . Depression   . Diabetes mellitus without complication ([place])    " New Onset " per patient  . GERD (gastroesophageal reflux disease)   . Heart murmur   . Hypertension    takes meds  . Lymphedema 06/07/2018  . Morbid (  severe) obesity due to excess calories ([place])   . Neuropathy    left leg , "from back surgery"  . Overactive bladder   . Schizophrenia ([place])   . Sciatica   . Shortness of breath   . Sleep apnea   . Snoring disorder    Pt stated my boyfriend always wakes me up and tells me to breathe     Past Surgical History:  Procedure Laterality Date  . BACK SURGERY     3 back surgeries  . CHOLECYSTECTOMY    . COLONOSCOPY WITH PROPOFOL N/A 01/23/2016   Procedure: COLONOSCOPY WITH PROPOFOL;   Surgeon: Wonda Horner, MD;  Location: [facility];  Service: Endoscopy;  Laterality: N/A;  . EYE SURGERY     Metal plate in right eye. Had fracture in right eye  . gallstones reomved    . KNEE ARTHROSCOPY WITH MENISCAL REPAIR Right 09/28/2016   Procedure: KNEE ARTHROSCOPY WITH MENISCAL REPAIR;  Surgeon: Dorna Leitz, MD;  Location: [place];  Service: Orthopedics;  Laterality: Right;  Right partial meniscectomy and chondroplasty, patellar/femoral joint and medial femoral condyle   . LUMBAR LAMINECTOMY/DECOMPRESSION MICRODISCECTOMY  04/01/2012   Procedure: LUMBAR LAMINECTOMY/DECOMPRESSION MICRODISCECTOMY 2 LEVELS;  Surgeon: Faythe Ghee, MD;  Location: [facility];  Service: Neurosurgery;  Laterality: Left;  Lumbar four-five, lumbar five sacral one microdiscectomy   . LUMBAR WOUND DEBRIDEMENT  04/29/2012   Procedure: LUMBAR WOUND DEBRIDEMENT;  Surgeon: Faythe Ghee, MD;  Location: [place] [facility]UR[facility];  Service: Neurosurgery;  Laterality: N/A;  lumbar wound debridement  . ROTATOR CUFF REPAIR     Right shoulder  . TUBAL LIGATION     Family History  Problem Relation Age of Onset  . Diabetes Mother   . Hypertension Mother   . Diabetes Father   . Heart disease Paternal Aunt   . Cancer Paternal Aunt    Social History   Tobacco Use  . Smoking status: Current Every Day Smoker    Packs/day: 1.00    Years: 24.00    Pack years: 24.00    Types: Cigarettes  . Smokeless tobacco: Never Used  Vaping Use  . Vaping Use: Never used  Substance Use Topics  . Alcohol use: No    Alcohol/week: 0.0 standard drinks    Comment: quit Nov. 2014  . Drug use: No   Current Outpatient Medications  Medication Sig Dispense Refill  . albuterol (PROAIR HFA) 108 (90 Base) MCG/ACT inhaler Inhale 2 puffs into the lungs every 4 (four) hours as needed for wheezing or shortness of breath.    [name] albuterol (PROVENTIL) (2.5 MG/3ML) 0.083% nebulizer solution Take 2.5 mg by nebulization every 4 (four) hours as needed for  wheezing or shortness of breath.    [name] albuterol (VENTOLIN HFA) 108 (90 Base) MCG/ACT inhaler albuterol sulfate HFA 90 mcg/actuation aerosol inhaler  INHALE 2 PUFFS BY MOUTH FOUR TIMES DAILY    . amLODipine (NORVASC) 10 MG tablet Take 1 tablet (10 mg total) by mouth daily. 90 tablet 2  . ARIPiprazole (ABILIFY) 20 MG tablet Take 1 tablet (20 mg total) by mouth at bedtime. 30 tablet 0  . benzonatate (TESSALON PERLES) 100 MG capsule Take 1 capsule (100 mg total) by mouth every 6 (six) hours as needed for cough. 30 capsule 0  . bisoprolol (ZEBETA) 5 MG tablet One twice daily 60 tablet 3  . blood glucose meter kit and supplies KIT Dispense based on patient and insurance preference. Use up to four times daily as  directed. (FOR ICD-9 250.00, 250.01). 1 each 0  . Budeson-Glycopyrrol-Formoterol (BREZTRI AEROSPHERE) 160-9-4.8 MCG/ACT AERO Inhale 2 puffs into the lungs 2 (two) times daily. 10.7 g 11  . fluticasone (CUTIVATE) 0.05 % cream fluticasone propionate 0.05 % topical cream  APPLY EXTERNALLY TO THE AFFECTED AREA TWICE DAILY FOR 14 DAYS    . furosemide (LASIX) 80 MG tablet Take $RemoveBef'80mg'UXGysVoZAA$  in the morning and $RemoveBef'40mg'eWuZyTOxpp$  in the evening. (Patient taking differently: 80 mg 2 (two) times daily. Take $RemoveBef'80mg'wYNtuficTZ$  in the morning and $RemoveBef'80mg'cVNOQrHpwz$  in the evening.) 180 tablet 2  . gabapentin (NEURONTIN) 300 MG capsule Take 300 mg by mouth 3 (three) times daily.     [name] glimepiride (AMARYL) 1 MG tablet Take 1 tablet (1 mg total) by mouth daily with breakfast. 30 tablet 3  . glipiZIDE (GLUCOTROL XL) 10 MG 24 hr tablet glipizide ER 10 mg tablet, extended release 24 hr  TAKE 1 TABLET BY MOUTH EVERY DAY    . glipiZIDE (GLUCOTROL XL) 5 MG 24 hr tablet glipizide ER 5 mg tablet, extended release 24 hr  TAKE 1 TABLET BY MOUTH EVERY DAY    . HYDROcodone-acetaminophen (NORCO/VICODIN) 5-325 MG tablet Take 1-2 tablets by mouth See admin instructions. Take 2 tablets by mouth daily at bedtime, may also take 1-2 tablets every 4 hours as needed for pain    .  HYDROcodone-acetaminophen (NORCO/VICODIN) 5-325 MG tablet hydrocodone 5 mg-acetaminophen 325 mg tablet  Take 1 tablet every 6 hours by oral route.    [name] HYDROcodone-acetaminophen (NORCO/VICODIN) 5-325 MG tablet Take 1-2 tablets by mouth every 6 (six) hours as needed. 20 tablet 0  . hydrOXYzine (VISTARIL) 25 MG capsule hydroxyzine pamoate 25 mg capsule  TAKE 1 CAPSULE BY MOUTH EVERY NIGHT AT BEDTIME    . insulin aspart protamine - aspart (NOVOLOG 70/30 MIX) (70-30) 100 UNIT/ML FlexPen Inject 0.18 mLs (18 Units total) into the skin 2 (two) times daily with a meal. (Patient taking differently: Inject 18 Units into the skin 3 (three) times daily with meals. ) 15 mL 11  . Insulin Pen Needle (NOVOFINE) 30G X 8 MM MISC Inject 10 each into the skin as needed. 100 each 3  . ipratropium (ATROVENT HFA) 17 MCG/ACT inhaler Atrovent HFA 17 mcg/actuation aerosol inhaler  INHALE 2 PUFFS INTO THE LUNGS EVERY 6 HOURS    . metFORMIN (GLUCOPHAGE) 1000 MG tablet metformin 1,000 mg tablet  TAKE 1 TABLET BY MOUTH TWICE DAILY    . montelukast (SINGULAIR) 10 MG tablet Take 1 tablet (10 mg total) by mouth at bedtime. 30 tablet 11  . omeprazole (PRILOSEC) 20 MG capsule omeprazole 20 mg capsule,delayed release  TAKE 1 CAPSULE BY MOUTH EVERY DAY    . omeprazole (PRILOSEC) 40 MG capsule Take 1 capsule (40 mg total) by mouth daily. 90 capsule 0  . potassium chloride (KLOR-CON) 10 MEQ tablet potassium chloride ER 10 mEq tablet,extended release  TAKE 1 TABLET BY MOUTH TWICE DAILY    . potassium chloride SA (K-DUR,KLOR-CON) 20 MEQ tablet Take 40 mEq by mouth 2 (two) times daily.     . predniSONE (DELTASONE) 10 MG tablet Take  4 each am x 2 days,   2 each am x 2 days,  1 each am x 2 days and stop 14 tablet 0  . PRESCRIPTION MEDICATION Inhale into the lungs at bedtime. CPAP    . Respiratory Therapy Supplies (FLUTTER) DEVI Use flutter device 3 times a day 1 each 0  . Triamcinolone Acetonide 0.025 % LOTN Apply 1  application topically  3 (three) times daily.    [name] triamterene-hydrochlorothiazide (MAXZIDE-25) 37.5-25 MG tablet triamterene 37.5 mg-hydrochlorothiazide 25 mg tablet  TAKE 1 TABLET BY MOUTH DAILY    . valsartan (DIOVAN) 160 MG tablet Take 1 tablet (160 mg total) by mouth daily. 21 tablet 0  . valsartan (DIOVAN) 80 MG tablet valsartan 80 mg tablet  TAKE 1 TABLET BY MOUTH EVERY DAY     No current facility-administered medications for this visit.   Allergies  Allergen Reactions  . Aspirin Nausea And Vomiting     Review of Systems: Positive for arthritis, back pain, cough, depression, headaches, itching, night sweats, muscle cramps, shortness of breath, skin rash, sleeping problems, sore throat, swelling of feet and legs, swollen lymph nodes, excessive thirst, urine leakage.  All other systems reviewed and negative except where noted in HPI.   PHYSICAL EXAM :    Wt Readings from Last 3 Encounters:  05/13/20 263 lb (119.3 kg)  02/26/20 276 lb 9.6 oz (125.5 kg)  09/19/19 280 lb (127 kg)    LMP 10/15/2018 Comment: patient had pregnancy test with PCP (test was negative) Constitutional:  Pleasant female in no acute distress. Psychiatric: Normal mood and affect. Behavior is normal. EENT: Pupils normal.  Conjunctivae are normal. No scleral icterus. Neck supple.  Cardiovascular: Normal rate, regular rhythm. No edema Pulmonary/chest: Effort normal and breath sounds normal. No wheezing, rales or rhonchi. Abdominal: Soft, nondistended, diffuse upper abdominal tenderness even to light palpation.  Bowel sounds active throughout. There are no masses palpable. No hepatomegaly. Neurological: Alert and oriented to person place and time. Skin: Skin is warm and dry. No rashes noted.  Tye Savoy, NP  05/22/2020, 8:23 AM  Cc:  Referring Provider [name], PA

## 2020-05-22 NOTE — Patient Instructions (Signed)
If you are age 50 or older, your body mass index should be between 23-30. Your Body mass index is 48.65 kg/m. If this is out of the aforementioned range listed, please consider follow up with your Primary Care Provider.  If you are age 55 or younger, your body mass index should be between 19-25. Your Body mass index is 48.65 kg/m. If this is out of the aformentioned range listed, please consider follow up with your Primary Care Provider.   Your provider has requested that you go to the basement level for lab work before leaving today. Press "B" on the elevator. The lab is located at the first door on the left as you exit the elevator.  We have sent the following medications to your pharmacy for you to pick up at your convenience: Lomotil twice daily.

## 2020-05-23 ENCOUNTER — Telehealth: Payer: Self-pay | Admitting: Nurse Practitioner

## 2020-05-23 NOTE — Progress Notes (Signed)
I agree with the above note, plan 

## 2020-05-27 LAB — FECAL LACTOFERRIN, QUANT
Fecal Lactoferrin: NEGATIVE
MICRO NUMBER:: 10923703
SPECIMEN QUALITY:: ADEQUATE

## 2020-05-27 LAB — CLOSTRIDIUM DIFFICILE TOXIN B, QUALITATIVE, REAL-TIME PCR: Toxigenic C. Difficile by PCR: NOT DETECTED

## 2020-05-27 NOTE — Telephone Encounter (Signed)
Please advise 

## 2020-05-27 NOTE — Telephone Encounter (Signed)
She can use OTC imodium. Start with one BID and she how it goes

## 2020-05-27 NOTE — Telephone Encounter (Signed)
Patient informed to use Imodium OTC. Patient voiced understanding.

## 2020-05-31 ENCOUNTER — Ambulatory Visit (AMBULATORY_SURGERY_CENTER): Payer: Medicaid Other | Admitting: Gastroenterology

## 2020-05-31 ENCOUNTER — Encounter: Payer: Self-pay | Admitting: Gastroenterology

## 2020-05-31 ENCOUNTER — Other Ambulatory Visit: Payer: Self-pay

## 2020-05-31 VITALS — BP 135/81 | HR 75 | Temp 99.2°F | Resp 20 | Ht 62.0 in | Wt 266.0 lb

## 2020-05-31 DIAGNOSIS — K2900 Acute gastritis without bleeding: Secondary | ICD-10-CM

## 2020-05-31 DIAGNOSIS — K2970 Gastritis, unspecified, without bleeding: Secondary | ICD-10-CM | POA: Diagnosis not present

## 2020-05-31 DIAGNOSIS — K2951 Unspecified chronic gastritis with bleeding: Secondary | ICD-10-CM | POA: Diagnosis not present

## 2020-05-31 DIAGNOSIS — K319 Disease of stomach and duodenum, unspecified: Secondary | ICD-10-CM

## 2020-05-31 DIAGNOSIS — R1013 Epigastric pain: Secondary | ICD-10-CM

## 2020-05-31 MED ORDER — SODIUM CHLORIDE 0.9 % IV SOLN: 500.0000 mL | Freq: Once | INTRAVENOUS | Status: DC

## 2020-05-31 NOTE — Progress Notes (Signed)
1000 Robinul 0.1 mg IV given due large amount of secretions upon assessment.  MD made aware, vss 

## 2020-05-31 NOTE — Progress Notes (Signed)
Pt's states no medical or surgical changes since previsit or office visit.  SP vitals and SS IV.

## 2020-05-31 NOTE — Patient Instructions (Signed)
Handout provided on gastritis.   YOU HAD AN ENDOSCOPIC PROCEDURE TODAY AT THE [facility]:   Refer to the procedure report that was given to you for any specific questions about what was found during the examination.  If the procedure report does not answer your questions, please call your gastroenterologist to clarify.  If you requested that your care partner not be given the details of your procedure findings, then the procedure report has been included in a sealed envelope for you to review at your convenience later.  YOU SHOULD EXPECT: Some feelings of bloating in the abdomen. Passage of more gas than usual.  Walking can help get rid of the air that was put into your GI tract during the procedure and reduce the bloating. If you had a lower endoscopy (such as a colonoscopy or flexible sigmoidoscopy) you may notice spotting of blood in your stool or on the toilet paper. If you underwent a bowel prep for your procedure, you may not have a normal bowel movement for a few days.  Please Note:  You might notice some irritation and congestion in your nose or some drainage.  This is from the oxygen used during your procedure.  There is no need for concern and it should clear up in a day or so.  SYMPTOMS TO REPORT IMMEDIATELY:  Following upper endoscopy (EGD)  Vomiting of blood or coffee ground material  New chest pain or pain under the shoulder blades  Painful or persistently difficult swallowing  New shortness of breath  Fever of 100F or higher  Black, tarry-looking stools  For urgent or emergent issues, a gastroenterologist can be reached at any hour by calling [phone]. Do not use MyChart messaging for urgent concerns.    DIET:  We do recommend a small meal at first, but then you may proceed to your regular diet.  Drink plenty of fluids but you should avoid alcoholic beverages for 24 hours.  ACTIVITY:  You should plan to take it easy for the rest of today and you should NOT  DRIVE or use heavy machinery until tomorrow (because of the sedation medicines used during the test).    FOLLOW UP: Our staff will call the number listed on your records 48-72 hours following your procedure to check on you and address any questions or concerns that you may have regarding the information given to you following your procedure. If we do not reach you, we will leave a message.  We will attempt to reach you two times.  During this call, we will ask if you have developed any symptoms of COVID 19. If you develop any symptoms (ie: fever, flu-like symptoms, shortness of breath, cough etc.) before then, please call [phone].  If you test positive for Covid 19 in the 2 weeks post procedure, please call and report this information to us.    If any biopsies were taken you will be contacted by phone or by letter within the next 1-3 weeks.  Please call us at [phone] if you have not heard about the biopsies in 3 weeks.    SIGNATURES/CONFIDENTIALITY: You and/or your care partner have signed paperwork which will be entered into your electronic medical record.  These signatures attest to the fact that that the information above on your After Visit Summary has been reviewed and is understood.  Full responsibility of the confidentiality of this discharge information lies with you and/or your care-partner.  

## 2020-05-31 NOTE — Progress Notes (Signed)
Called to room to assist during endoscopic procedure.  Patient ID and intended procedure confirmed with present staff. Received instructions for my participation in the procedure from the performing physician.  

## 2020-05-31 NOTE — Progress Notes (Signed)
Report given to PACU, vss 

## 2020-05-31 NOTE — Op Note (Signed)
[facility] Patient Name: Lori Bean Procedure Date: 05/31/2020 9:52 AM MRN: [id number] Endoscopist: Milus Banister , MD Age: 49 Referring MD:  Date of Birth: [birth year] Gender: Female Account #: [id number] Procedure:                Upper GI endoscopy Indications:              Epigastric abdominal pain; cbc, cmet, 04/2020 all                            essentially normal. BMI 48 Medicines:                Monitored Anesthesia Care Procedure:                Pre-Anesthesia Assessment:                           - Prior to the procedure, a History and Physical                            was performed, and patient medications and                            allergies were reviewed. The patient's tolerance of                            previous anesthesia was also reviewed. The risks                            and benefits of the procedure and the sedation                            options and risks were discussed with the patient.                            All questions were answered, and informed consent                            was obtained. Prior Anticoagulants: The patient has                            taken no previous anticoagulant or antiplatelet                            agents. ASA Grade Assessment: III - A patient with                            severe systemic disease. After reviewing the risks                            and benefits, the patient was deemed in                            satisfactory condition to undergo the procedure.  After obtaining informed consent, the endoscope was                            passed under direct vision. Throughout the                            procedure, the patient's blood pressure, pulse, and                            oxygen saturations were monitored continuously. The                            Endoscope was introduced through the mouth, and                            advanced to the second  part of duodenum. The upper                            GI endoscopy was accomplished without difficulty.                            The patient tolerated the procedure well. Scope In: Scope Out: Findings:                 Diffuse moderate inflammation characterized by                            erosions, erythema, friability, some recent hematin                            and granularity was found in the entire examined                            stomach. Biopsies were taken with a cold forceps                            for histology.                           The exam was otherwise without abnormality. Complications:            No immediate complications. Estimated blood loss:                            None. Estimated Blood Loss:     Estimated blood loss: none. Impression:               - Diffuse, moderate gastritis.                           - The examination was otherwise normal. Recommendation:           - Patient has a contact number available for                            emergencies. The signs and symptoms of potential  delayed complications were discussed with the                            patient. Return to normal activities tomorrow.                            Written discharge instructions were provided to the                            patient.                           - Resume previous diet.                           - Continue present medications.                           - Await pathology results. Milus Banister, MD 05/31/2020 10:18:47 AM This report has been signed electronically.

## 2020-06-03 ENCOUNTER — Ambulatory Visit
Admission: EM | Admit: 2020-06-03 | Discharge: 2020-06-03 | Disposition: A | Discharge status: Home / Self Care | Payer: Medicaid Other | Attending: Family Medicine | Admitting: Family Medicine

## 2020-06-03 ENCOUNTER — Other Ambulatory Visit: Payer: Self-pay

## 2020-06-03 ENCOUNTER — Encounter: Payer: Self-pay | Admitting: Emergency Medicine

## 2020-06-03 DIAGNOSIS — L304 Erythema intertrigo: Secondary | ICD-10-CM | POA: Insufficient documentation

## 2020-06-03 MED ORDER — CHLORHEXIDINE GLUCONATE 0.12 % MT SOLN: 15.0000 mL | Freq: Two times a day (BID) | OROMUCOSAL | 0 refills | Status: DC

## 2020-06-03 MED ORDER — FLUCONAZOLE 200 MG PO TABS: 200.0000 mg | ORAL_TABLET | Freq: Every day | ORAL | 0 refills | Status: AC

## 2020-06-03 MED ORDER — DOXYCYCLINE HYCLATE 100 MG PO CAPS: 100.0000 mg | ORAL_CAPSULE | Freq: Two times a day (BID) | ORAL | 0 refills | Status: DC

## 2020-06-03 NOTE — ED Provider Notes (Signed)
[facility]    CSN: [id number] Arrival date & time: 06/03/20  1154      History   Chief Complaint Chief Complaint  Patient presents with  . Abscess    HPI Lori Bean is a 49 y.o. female.   Complains of rash and abscess under her right breast and right axilla.  There is been some slight amount of drainage from abscess under breast. HPI  Past Medical History:  Diagnosis Date  . Abdominal hernia   . Anginal pain ([place])    occ from asthma  . Anxiety   . Arthritis   . Asthma   . Bipolar affective disorder ([place])    Schizophrenia  . Bronchitis    hx of  . CHF (congestive heart failure) ([place])   . Depression   . Diabetes mellitus without complication ([place])    " New Onset " per patient  . GERD (gastroesophageal reflux disease)   . Heart murmur   . Hypertension    takes meds  . Lymphedema 06/07/2018  . Morbid (severe) obesity due to excess calories ([place])   . Neuropathy    left leg , "from back surgery"  . Overactive bladder   . Schizophrenia ([place])   . Sciatica   . Shortness of breath   . Sleep apnea   . Snoring disorder    Pt stated my boyfriend always wakes me up and tells me to breathe    Patient Active Problem List   Diagnosis Date Noted  . Acute on chronic respiratory failure ([place]) 09/01/2019  . COPD with acute exacerbation ([place]) 08/28/2019  . COPD exacerbation ([place]) 08/28/2019  . Acute bronchitis 08/16/2019  . Tongue cancer ([place]) 02/14/2019  . 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 Sinusitis 07/22/2018  . Lymphedema 06/07/2018  . Abnormal uterine bleeding (AUB) 12/12/2017  . CHF (congestive heart failure) ([place])   . Chronic asthma, mild persistent, uncomplicated 40/98/1191  . Dyspnea on exertion 05/05/2017  . Asthma exacerbation in COPD ([place]) 05/05/2017  . Morbid obesity due to excess calories ([place]) 05/05/2017  . Asthma exacerbation 12/17/2016  . 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 Synovial plica of right knee 47/82/9562  . Tear  of medial meniscus of right knee, initial encounter 09/28/2016  . OSA (obstructive sleep apnea) 09/23/2016  . Chronic diastolic heart failure ([place]) 04/22/2016  . Prediabetes 04/22/2016  . Cigarette smoker 04/03/2014  . Chest pain 04/03/2014  . Lumbar spondylosis 12/28/2013  . Extrinsic asthma 06/07/2013  . Anxiety   . Essential hypertension   . Bipolar affective disorder ([place])   . GERD (gastroesophageal reflux disease)   . Arthritis   . Schizophrenia ([place])   . Overactive bladder     Past Surgical History:  Procedure Laterality Date  . BACK SURGERY     3 back surgeries  . CHOLECYSTECTOMY    . COLONOSCOPY WITH PROPOFOL N/A 01/23/2016   Procedure: COLONOSCOPY WITH PROPOFOL;  Surgeon: Wonda Horner, MD;  Location: [facility];  Service: Endoscopy;  Laterality: N/A;  . EYE SURGERY     Metal plate in right eye. Had fracture in right eye  . gallstones reomved    . KNEE ARTHROSCOPY WITH MENISCAL REPAIR Right 09/28/2016   Procedure: KNEE ARTHROSCOPY WITH MENISCAL REPAIR;  Surgeon: Dorna Leitz, MD;  Location: [facility];  Service: Orthopedics;  Laterality: Right;  Right partial meniscectomy and chondroplasty, patellar/femoral joint and medial femoral condyle   . LUMBAR LAMINECTOMY/DECOMPRESSION MICRODISCECTOMY  04/01/2012   Procedure: LUMBAR LAMINECTOMY/DECOMPRESSION MICRODISCECTOMY  2 LEVELS;  Surgeon: Faythe Ghee, MD;  Location: [facility];  Service: Neurosurgery;  Laterality: Left;  Lumbar four-five, lumbar five sacral one microdiscectomy   . LUMBAR WOUND DEBRIDEMENT  04/29/2012   Procedure: LUMBAR WOUND DEBRIDEMENT;  Surgeon: Faythe Ghee, MD;  Location: [facility];  Service: Neurosurgery;  Laterality: N/A;  lumbar wound debridement  . ROTATOR CUFF REPAIR     Right shoulder  . TUBAL LIGATION      OB History    Gravida  4   Para  2   Term  0   Preterm  2   AB  2   Living  2     SAB  1   TAB  1   Ectopic      Multiple      Live Births  2            Home  Medications    Prior to Admission medications   Medication Sig Start Date End Date Taking? Authorizing Provider  albuterol (PROAIR HFA) 108 (90 Base) MCG/ACT inhaler Inhale 2 puffs into the lungs every 4 (four) hours as needed for wheezing or shortness of breath.    [provider]  albuterol (PROVENTIL) (2.5 MG/3ML) 0.083% nebulizer solution Take 2.5 mg by nebulization every 4 (four) hours as needed for wheezing or shortness of breath.    [provider]  albuterol (VENTOLIN HFA) 108 (90 Base) MCG/ACT inhaler albuterol sulfate HFA 90 mcg/actuation aerosol inhaler  INHALE 2 PUFFS BY MOUTH FOUR TIMES DAILY    [provider]  amLODipine (NORVASC) 10 MG tablet Take 1 tablet (10 mg total) by mouth daily. 08/31/19 05/27/20  Nita Sells, MD  ARIPiprazole (ABILIFY) 20 MG tablet Take 1 tablet (20 mg total) by mouth at bedtime. 03/04/15   Lance Bosch, NP  bisoprolol (ZEBETA) 5 MG tablet One twice daily 01/23/20   Erlene Quan, PA-C  blood glucose meter kit and supplies KIT Dispense based on patient and insurance preference. Use up to four times daily as directed. (FOR ICD-9 250.00, 250.01). 08/31/19   Nita Sells, MD  Budeson-Glycopyrrol-Formoterol (BREZTRI AEROSPHERE) 160-9-4.8 MCG/ACT AERO Inhale 2 puffs into the lungs 2 (two) times daily. 09/18/19   Tanda Rockers, MD  diphenoxylate-atropine (LOMOTIL) 2.5-0.025 MG tablet Take 1 tablet by mouth 2 (two) times daily. 05/22/20   Willia Craze, NP  fluticasone (CUTIVATE) 0.05 % cream fluticasone propionate 0.05 % topical cream  APPLY EXTERNALLY TO THE AFFECTED AREA TWICE DAILY FOR 14 DAYS    [provider]  furosemide (LASIX) 80 MG tablet Take $RemoveBef'80mg'wDoUByikVt$  in the morning and $RemoveBef'40mg'dJWtDdQhKH$  in the evening. Patient taking differently: 80 mg 2 (two) times daily. Take $RemoveBef'80mg'ORcYCTrhpj$  in the morning and $RemoveBef'80mg'BLtFsYqfxv$  in the evening. 09/26/19   Erlene Quan, PA-C  gabapentin (NEURONTIN) 300 MG capsule Take 300 mg by mouth 3 (three) times  daily.  02/02/19   [provider]  glimepiride (AMARYL) 1 MG tablet Take 1 tablet (1 mg total) by mouth daily with breakfast. 08/31/19   Nita Sells, MD  glipiZIDE (GLUCOTROL XL) 10 MG 24 hr tablet glipizide ER 10 mg tablet, extended release 24 hr  TAKE 1 TABLET BY MOUTH EVERY DAY    [provider]  HYDROcodone-acetaminophen (NORCO/VICODIN) 5-325 MG tablet hydrocodone 5 mg-acetaminophen 325 mg tablet  Take 1 tablet every 6 hours by oral route.    [provider]  hydrOXYzine (VISTARIL) 25 MG capsule hydroxyzine pamoate 25 mg capsule  TAKE 1 CAPSULE BY MOUTH EVERY NIGHT AT BEDTIME    [provider]  ipratropium (ATROVENT HFA) 17 MCG/ACT inhaler Atrovent HFA 17 mcg/actuation aerosol inhaler  INHALE 2 PUFFS INTO THE LUNGS EVERY 6 HOURS    [provider]  metFORMIN (GLUCOPHAGE) 1000 MG tablet metformin 1,000 mg tablet  TAKE 1 TABLET BY MOUTH TWICE DAILY    [provider]  montelukast (SINGULAIR) 10 MG tablet Take 1 tablet (10 mg total) by mouth at bedtime. 09/20/19   Martyn Ehrich, NP  omeprazole (PRILOSEC) 20 MG capsule omeprazole 20 mg capsule,delayed release  TAKE 1 CAPSULE BY MOUTH EVERY DAY Patient not taking: Reported on 05/31/2020    [provider]  omeprazole (PRILOSEC) 40 MG capsule Take 1 capsule (40 mg total) by mouth daily. 12/14/17   [name], MD  potassium chloride (KLOR-CON) 10 MEQ tablet potassium chloride ER 10 mEq tablet,extended release  TAKE 1 TABLET BY MOUTH TWICE DAILY Patient not taking: Reported on 05/31/2020    [provider]  potassium chloride SA (K-DUR,KLOR-CON) 20 MEQ tablet Take 40 mEq by mouth 2 (two) times daily.     [provider]  PRESCRIPTION MEDICATION Inhale into the lungs at bedtime. CPAP    [provider]  Respiratory Therapy Supplies (FLUTTER) DEVI Use flutter device 3 times a day 07/20/18   Martyn Ehrich, NP  Triamcinolone Acetonide 0.025 %  LOTN Apply 1 application topically 3 (three) times daily.    [provider]  triamterene-hydrochlorothiazide (MAXZIDE-25) 37.5-25 MG tablet triamterene 37.5 mg-hydrochlorothiazide 25 mg tablet  TAKE 1 TABLET BY MOUTH DAILY    [provider]  valsartan (DIOVAN) 80 MG tablet valsartan 80 mg tablet  TAKE 1 TABLET BY MOUTH EVERY DAY    [provider]    Family History Family History  Problem Relation Age of Onset  . Diabetes Mother   . Hypertension Mother   . Diabetes Father   . Heart disease Paternal Aunt   . Cancer Paternal Aunt   . Esophageal cancer Neg Hx   . Stomach cancer Neg Hx     Social History Social History   Tobacco Use  . Smoking status: Current Every Day Smoker    Packs/day: 1.00    Years: 24.00    Pack years: 24.00    Types: Cigarettes  . Smokeless tobacco: Never Used  Vaping Use  . Vaping Use: Every day  . Substances: Nicotine  Substance Use Topics  . Alcohol use: No    Alcohol/week: 0.0 standard drinks    Comment: quit Nov. 2014  . Drug use: No     Allergies   Aspirin   Review of Systems Review of Systems  Skin: Positive for wound.  All other systems reviewed and are negative.    Physical Exam Triage Vital Signs ED Triage Vitals  Enc Vitals Group     BP 06/03/20 1315 (!) 156/93     Pulse Rate 06/03/20 1315 74     Resp 06/03/20 1315 18     Temp 06/03/20 1315 98.3 F (36.8 C)     Temp Source 06/03/20 1315 Oral     SpO2 06/03/20 1315 94 %     Weight --      Height --      Head Circumference --      Peak Flow --      Pain Score 06/03/20 1316 6     Pain Loc --      Pain  Edu? --      Excl. in [place]? --    No data found.  Updated Vital Signs BP (!) 156/93 (BP Location: Left Arm)   Pulse 74   Temp 98.3 F (36.8 C) (Oral)   Resp 18   LMP 10/15/2018 Comment: patient had pregnancy test with PCP (test was negative)  SpO2 94%   Visual Acuity Right Eye Distance:   Left Eye Distance:   Bilateral Distance:      Right Eye Near:   Left Eye Near:    Bilateral Near:     Physical Exam Vitals and nursing note reviewed.  Constitutional:      Appearance: Normal appearance. She is obese.  Skin:    Comments: Skin under right breast inflamed consistent with intertrigo.  There is a small abscess within the area that looks like secondary infection.  Small amount of pus was evident and this was cultured  Neurological:     Mental Status: She is alert.      UC Treatments / Results  Labs (all labs ordered are listed, but only abnormal results are displayed) Labs Reviewed  AEROBIC CULTURE (SUPERFICIAL SPECIMEN)    EKG   Radiology No results found.  Procedures Procedures (including critical care time)  Medications Ordered in UC Medications - No data to display  Initial Impression / Assessment and Plan / UC Course  I have reviewed the triage vital signs and the nursing notes.  Pertinent labs & imaging results that were available during my care of the patient were reviewed by me and considered in my medical decision making (see chart for details).     Intertrigo with secondary bacterial infection Final Clinical Impressions(s) / UC Diagnoses   Final diagnoses:  None   Discharge Instructions   None    ED Prescriptions    None     PDMP not reviewed this encounter.   Wardell Honour, MD 06/03/20 1331

## 2020-06-03 NOTE — ED Triage Notes (Signed)
Pt here with multiple abscess areas under right breast, axillary area and in gums with pain per pt x 1 week

## 2020-06-04 ENCOUNTER — Telehealth: Payer: Self-pay | Admitting: *Deleted

## 2020-06-04 NOTE — Telephone Encounter (Signed)
  Follow up Call-  Call back number 05/31/2020  Post procedure Call Back phone  # [phone]  Permission to leave phone message Yes  Some recent data might be hidden     Patient questions:  Do you have a fever, pain , or abdominal swelling? No. Pain Score  0 *  Have you tolerated food without any problems? Yes.    Have you been able to return to your normal activities? Yes.    Do you have any questions about your discharge instructions: Diet   No. Medications  No. Follow up visit  No.  Do you have questions or concerns about your Care? No.  Actions: * If pain score is 4 or above: No action needed, pain <4.  1. Have you developed a fever since your procedure? no  2.   Have you had an respiratory symptoms (SOB or cough) since your procedure? no  3.   Have you tested positive for COVID 19 since your procedure no  4.   Have you had any family members/close contacts diagnosed with the COVID 19 since your procedure?  no   If yes to any of these questions please route to Joylene John, RN and Joella Prince, RN

## 2020-06-04 NOTE — Telephone Encounter (Signed)
  Follow up Call-  Call back number 05/31/2020  Post procedure Call Back phone  # [phone]  Permission to leave phone message Yes  Some recent data might be hidden     First attempt for follow up phone call. No answer at number given.  Left message on voicemail.

## 2020-06-06 LAB — AEROBIC CULTURE W GRAM STAIN (SUPERFICIAL SPECIMEN)

## 2020-06-07 ENCOUNTER — Encounter: Payer: Self-pay | Admitting: Gastroenterology

## 2020-06-10 ENCOUNTER — Telehealth: Payer: Self-pay | Admitting: Gastroenterology

## 2020-06-10 NOTE — Telephone Encounter (Signed)
Error

## 2020-06-10 NOTE — Telephone Encounter (Signed)
The pt has been given the information in the letter and all questions answered

## 2020-06-10 NOTE — Telephone Encounter (Signed)
Left message on machine to call back    Dear Ms. Wedekind,   The biopsies which I took from your stomach last week showed no sign of infection, serious inflammation or cancer.  That is obviously good news.  Please continue with the suggestions outlined at your recent visit.  If you have any questions or concerns, please don't hesitate to call.  Sincerely,    Milus Banister, MD

## 2020-06-10 NOTE — Telephone Encounter (Signed)
Pt is returning your  Call.

## 2020-06-20 ENCOUNTER — Telehealth: Payer: Self-pay | Admitting: Pulmonary Disease

## 2020-06-20 NOTE — Telephone Encounter (Signed)
Spoke with pt and she states she is having problems with Bipap machine. Pt called [employer] and was told that she would need a new sleep study to get a new machine. Advised pt that since it has been over a year since she seen [name] she would need an ov first. Appt made with Waylan Rocher, NP 06/21/20. PT verbalized understanding. Nothing further needed.

## 2020-06-21 ENCOUNTER — Encounter: Payer: Self-pay | Admitting: Primary Care

## 2020-06-21 ENCOUNTER — Ambulatory Visit (INDEPENDENT_AMBULATORY_CARE_PROVIDER_SITE_OTHER): Payer: Medicaid Other | Admitting: Primary Care

## 2020-06-21 ENCOUNTER — Ambulatory Visit: Payer: Medicaid Other

## 2020-06-21 ENCOUNTER — Other Ambulatory Visit: Payer: Self-pay

## 2020-06-21 VITALS — BP 126/80 | HR 81 | Ht 62.0 in | Wt 267.8 lb

## 2020-06-21 DIAGNOSIS — R0602 Shortness of breath: Secondary | ICD-10-CM

## 2020-06-21 DIAGNOSIS — G4733 Obstructive sleep apnea (adult) (pediatric): Secondary | ICD-10-CM | POA: Diagnosis not present

## 2020-06-21 DIAGNOSIS — I509 Heart failure, unspecified: Secondary | ICD-10-CM

## 2020-06-21 DIAGNOSIS — J449 Chronic obstructive pulmonary disease, unspecified: Secondary | ICD-10-CM | POA: Diagnosis not present

## 2020-06-21 LAB — BASIC METABOLIC PANEL
BUN: 14 mg/dL (ref 6–23)
CO2: 29 mEq/L (ref 19–32)
Calcium: 9.6 mg/dL (ref 8.4–10.5)
Chloride: 103 mEq/L (ref 96–112)
Creatinine, Ser: 0.65 mg/dL (ref 0.40–1.20)
GFR: 103.34 mL/min (ref >60.00)
Glucose, Bld: 123 mg/dL — ABNORMAL HIGH (ref 70–99)
Potassium: 3.8 mEq/L (ref 3.5–5.1)
Sodium: 140 mEq/L (ref 135–145)

## 2020-06-21 LAB — BRAIN NATRIURETIC PEPTIDE: Pro B Natriuretic peptide (BNP): 16 pg/mL (ref 0.0–100.0)

## 2020-06-21 NOTE — Assessment & Plan Note (Addendum)
-   Patient reports not getting enough air from CPAP, machine is not working properly and needs repair or new machine. She had sleep study in 2017 and has been compliant ever since with CPAP. - FU 4-6 weeks televisit (if no improvement with new machine would recommend CPAP titration study)

## 2020-06-21 NOTE — Patient Instructions (Addendum)
Orders: - Labs today (BNP and BMET) - Needs CPAP repair or new machine. She had sleep study in 2017 and has been compliant ever since with CPAP.   Recommendations: - Continue Breztri two puffs twice daily  - Take mucinex twice daily - Use flonase nasal spray daily (over the counter)  Rx: - Prednisone taper as directed   Follow-up: - 4-6 weeks televisit (if no improvement with new machine would recommend CPAP titration study)    Steps to Quit Smoking Smoking tobacco is the leading cause of preventable death. It can affect almost every organ in the body. Smoking puts you and those around you at risk for developing many serious chronic diseases. Quitting smoking can be difficult, but it is one of the best things that you can do for your health. It is never too late to quit. How do I get ready to quit? When you decide to quit smoking, create a plan to help you succeed. Before you quit:  Pick a date to quit. Set a date within the next 2 weeks to give you time to prepare.  Write down the reasons why you are quitting. Keep this list in places where you will see it often.  Tell your family, friends, and co-workers that you are quitting. Support from your loved ones can make quitting easier.  Talk with your health care provider about your options for quitting smoking.  Find out what treatment options are covered by your health insurance.  Identify people, places, things, and activities that make you want to smoke (triggers). Avoid them. What first steps can I take to quit smoking?  Throw away all cigarettes at home, at work, and in your car.  Throw away smoking accessories, such as Scientist, research (medical).  Clean your car. Make sure to empty the ashtray.  Clean your home, including curtains and carpets. What strategies can I use to quit smoking? Talk with your health care provider about combining strategies, such as taking medicines while you are also receiving in-person counseling.  Using these two strategies together makes you more likely to succeed in quitting than if you used either strategy on its own.  If you are pregnant or breastfeeding, talk with your health care provider about finding counseling or other support strategies to quit smoking. Do not take medicine to help you quit smoking unless your health care provider tells you to do so. To quit smoking: Quit right away  Quit smoking completely, instead of gradually reducing how much you smoke over a period of time. Research shows that stopping smoking right away is more successful than gradually quitting.  Attend in-person counseling to help you build problem-solving skills. You are more likely to succeed in quitting if you attend counseling sessions regularly. Even short sessions of 10 minutes can be effective. Take medicine You may take medicines to help you quit smoking. Some medicines require a prescription and some you can purchase over-the-counter. Medicines may have nicotine in them to replace the nicotine in cigarettes. Medicines may:  Help to stop cravings.  Help to relieve withdrawal symptoms. Your health care provider may recommend:  Nicotine patches, gum, or lozenges.  Nicotine inhalers or sprays.  Non-nicotine medicine that is taken by mouth. Find resources Find resources and support systems that can help you to quit smoking and remain smoke-free after you quit. These resources are most helpful when you use them often. They include:  Online chats with a Social worker.  Telephone quitlines.  Printed Furniture conservator/restorer.  Support  groups or group counseling.  Text messaging programs.  Mobile phone apps or applications. Use apps that can help you stick to your quit plan by providing reminders, tips, and encouragement. There are many free apps for mobile devices as well as websites. Examples include Quit Guide from the [employer] and smokefree.gov What things can I do to make it easier to  quit?   Reach out to your family and friends for support and encouragement. Call telephone quitlines (1-800-QUIT-NOW), reach out to support groups, or work with a counselor for support.  Ask people who smoke to avoid smoking around you.  Avoid places that trigger you to smoke, such as bars, parties, or smoke-break areas at work.  Spend time with people who do not smoke.  Lessen the stress in your life. Stress can be a smoking trigger for some people. To lessen stress, try: ? Exercising regularly. ? Doing deep-breathing exercises. ? Doing yoga. ? Meditating. ? Performing a body scan. This involves closing your eyes, scanning your body from head to toe, and noticing which parts of your body are particularly tense. Try to relax the muscles in those areas. How will I feel when I quit smoking? Day 1 to 3 weeks Within the first 24 hours of quitting smoking, you may start to feel withdrawal symptoms. These symptoms are usually most noticeable 2-3 days after quitting, but they usually do not last for more than 2-3 weeks. You may experience these symptoms:  Mood swings.  Restlessness, anxiety, or irritability.  Trouble concentrating.  Dizziness.  Strong cravings for sugary foods and nicotine.  Mild weight gain.  Constipation.  Nausea.  Coughing or a sore throat.  Changes in how the medicines that you take for unrelated issues work in your body.  Depression.  Trouble sleeping (insomnia). Week 3 and afterward After the first 2-3 weeks of quitting, you may start to notice more positive results, such as:  Improved sense of smell and taste.  Decreased coughing and sore throat.  Slower heart rate.  Lower blood pressure.  Clearer skin.  The ability to breathe more easily.  Fewer sick days. Quitting smoking can be very challenging. Do not get discouraged if you are not successful the first time. Some people need to make many attempts to quit before they achieve long-term  success. Do your best to stick to your quit plan, and talk with your health care provider if you have any questions or concerns. Summary  Smoking tobacco is the leading cause of preventable death. Quitting smoking is one of the best things that you can do for your health.  When you decide to quit smoking, create a plan to help you succeed.  Quit smoking right away, not slowly over a period of time.  When you start quitting, seek help from your health care provider, family, or friends. This information is not intended to replace advice given to you by your health care provider. Make sure you discuss any questions you have with your health care provider. Document Revised: 05/26/2019 Document Reviewed: 11/19/2018 Elsevier Patient Education  [place].

## 2020-06-21 NOTE — Assessment & Plan Note (Addendum)
-   AECOPD. Patient reports shortness of breath with wheezing and productive cough. On exam patient had scattered wheezing and rhonchi t/o. Labs normal.  - Continue Breztri two puffs twice daily  - Advised patient to take prednisone taper and mucinex twice daily  - If not better patient to notify office

## 2020-06-21 NOTE — Progress Notes (Signed)
$'@Patient'Z$  ID: Lori Bean, female    DOB: 1971/08/01, 49 y.o.   MRN: [id number]  Chief Complaint  Patient presents with  . Follow-up    Pt states she has had some increased SOB. Also states she has been having issues trying to sleep at night as she feels like she has been drowning with BIPAP on. Pt believes that she needs to have a new BIPAP machine. DME: [facility]    Referring provider: [name], PA     Brief patient profile:  A is a skin ctive smoker  healthy child/ 2 IUP's  With baseline wt < 200 with new doe x 2012 assoc with wt gain gain x 2016 referred to pulmonary clinic 06/04/2017 by Dr   Vista Lawman with dx mild asthma ? Early copd  HPI: 49 year old female, current everyday smoker.  Past medical history significant for hypertension, chronic diastolic heart failure,  COPD mixed type, OSA on BiPAP, GERD.  Patient of Dr. Waynard Edwards.  Patient was tried on Bevespi with worsening symptoms.  No longer maintenance inhaler given prednisone for 6 days and recommended follow-up with all medications in hand.   06/21/2020- Interim hx Patient presents today for OSA. Feels she is "drowing" when wearing her CPAP as if she is not getting enough air. DME company is [facility], she called them and was told in order to get new machine she needed an office visit. She had Split Night sleep study on10/6/17 which showed moderate OSA- AHI 26.1/ hr,  SpO2 low 87%/ body weight 201 lbs. CPAP titrated to 14 cm h20. She has gained 60 lbs since study.   She gets very short winded. Associated wheezing and productive cough with thick mucus. She is currently using Breztri as prescribed twice daily, needs refill. States that her symptoms do not typically improve with prednisone.  She has leg swelling/lymphedema, not using leg compression device at night with CPAP because she gets hot with both on. She is compliant with Lasix $RemoveBe'80mg'iKRiUcNer$  am and $Remo'40mg'iJWtS$  pm. She will only occasionally misses evening dose of lasix on Friday  nights.   She is in a bad relationship, tells me that she is safe. Wants to get her own place.   Allergies  Allergen Reactions  . Aspirin Nausea And Vomiting    Immunization History  Administered Date(s) Administered  . Influenza Inj Mdck Quad Pf 08/22/2018  . Influenza Split 06/07/2013, 06/15/2015  . Influenza,inj,Quad PF,6+ Mos 07/22/2017, 08/31/2019, 06/11/2020  . Influenza,inj,quad, With Preservative 08/15/2019  . Moderna SARS-COVID-2 Vaccination 12/03/2019, 01/01/2020  . Pneumococcal Polysaccharide-23 04/03/2014, 08/31/2019  . Rabies, IM 05/04/2016, 05/12/2016    Past Medical History:  Diagnosis Date  . Abdominal hernia   . Anginal pain ([place])    occ from asthma  . Anxiety   . Arthritis   . Asthma   . Bipolar affective disorder ([place])    Schizophrenia  . Bronchitis    hx of  . CHF (congestive heart failure) ([place])   . Depression   . Diabetes mellitus without complication ([place])    " New Onset " per patient  . GERD (gastroesophageal reflux disease)   . Heart murmur   . Hypertension    takes meds  . Lymphedema 06/07/2018  . Morbid (severe) obesity due to excess calories ([place])   . Neuropathy    left leg , "from back surgery"  . Overactive bladder   . Schizophrenia ([place])   . Sciatica   . Shortness of breath   . Sleep  apnea   . Snoring disorder    Pt stated my boyfriend always wakes me up and tells me to breathe    Tobacco History: Social History   Tobacco Use  Smoking Status Current Every Day Smoker  . Packs/day: 1.00  . Years: 24.00  . Pack years: 24.00  . Types: Cigarettes  Smokeless Tobacco Never Used   Ready to quit: Not Answered Counseling given: Not Answered   Outpatient Medications Prior to Visit  Medication Sig Dispense Refill  . albuterol (PROAIR HFA) 108 (90 Base) MCG/ACT inhaler Inhale 2 puffs into the lungs every 4 (four) hours as needed for wheezing or shortness of breath.    [name] albuterol (PROVENTIL) (2.5 MG/3ML) 0.083% nebulizer solution  Take 2.5 mg by nebulization every 4 (four) hours as needed for wheezing or shortness of breath.    [name] amLODipine (NORVASC) 10 MG tablet Take 10 mg by mouth daily.    . ARIPiprazole (ABILIFY) 20 MG tablet Take 1 tablet (20 mg total) by mouth at bedtime. 30 tablet 0  . bisoprolol (ZEBETA) 5 MG tablet One twice daily 60 tablet 3  . blood glucose meter kit and supplies KIT Dispense based on patient and insurance preference. Use up to four times daily as directed. (FOR ICD-9 250.00, 250.01). 1 each 0  . Budeson-Glycopyrrol-Formoterol (BREZTRI AEROSPHERE) 160-9-4.8 MCG/ACT AERO Inhale 2 puffs into the lungs 2 (two) times daily. 10.7 g 11  . chlorhexidine (PERIDEX) 0.12 % solution Use as directed 15 mLs in the mouth or throat 2 (two) times daily. 120 mL 0  . diphenoxylate-atropine (LOMOTIL) 2.5-0.025 MG tablet Take 1 tablet by mouth 2 (two) times daily. 60 tablet 3  . fluticasone (CUTIVATE) 0.05 % cream fluticasone propionate 0.05 % topical cream  APPLY EXTERNALLY TO THE AFFECTED AREA TWICE DAILY FOR 14 DAYS    . furosemide (LASIX) 80 MG tablet Take $RemoveBef'80mg'SPtfCrzwaM$  in the morning and $RemoveBef'40mg'GOSjJyfddM$  in the evening. (Patient taking differently: 80 mg 2 (two) times daily. Take $RemoveBef'80mg'XkkcwTnUIV$  in the morning and $RemoveBef'80mg'fHtzIZuxeS$  in the evening.) 180 tablet 2  . gabapentin (NEURONTIN) 300 MG capsule Take 300 mg by mouth 3 (three) times daily.     [name] glimepiride (AMARYL) 1 MG tablet Take 1 tablet (1 mg total) by mouth daily with breakfast. 30 tablet 3  . glipiZIDE (GLUCOTROL XL) 10 MG 24 hr tablet glipizide ER 10 mg tablet, extended release 24 hr  TAKE 1 TABLET BY MOUTH EVERY DAY    . hydrOXYzine (VISTARIL) 25 MG capsule hydroxyzine pamoate 25 mg capsule  TAKE 1 CAPSULE BY MOUTH EVERY NIGHT AT BEDTIME    . ipratropium (ATROVENT HFA) 17 MCG/ACT inhaler Atrovent HFA 17 mcg/actuation aerosol inhaler  INHALE 2 PUFFS INTO THE LUNGS EVERY 6 HOURS    . metFORMIN (GLUCOPHAGE) 1000 MG tablet metformin 1,000 mg tablet  TAKE 1 TABLET BY MOUTH TWICE DAILY    .  montelukast (SINGULAIR) 10 MG tablet Take 1 tablet (10 mg total) by mouth at bedtime. 30 tablet 11  . omeprazole (PRILOSEC) 40 MG capsule Take 1 capsule (40 mg total) by mouth daily. 90 capsule 0  . potassium chloride SA (K-DUR,KLOR-CON) 20 MEQ tablet Take 40 mEq by mouth 2 (two) times daily.     [name] PRESCRIPTION MEDICATION Inhale into the lungs at bedtime. CPAP    . Respiratory Therapy Supplies (FLUTTER) DEVI Use flutter device 3 times a day 1 each 0  . Triamcinolone Acetonide 0.025 % LOTN Apply 1 application topically 3 (three) times daily.    [name]  triamterene-hydrochlorothiazide (MAXZIDE-25) 37.5-25 MG tablet triamterene 37.5 mg-hydrochlorothiazide 25 mg tablet  TAKE 1 TABLET BY MOUTH DAILY    . valsartan (DIOVAN) 80 MG tablet valsartan 80 mg tablet  TAKE 1 TABLET BY MOUTH EVERY DAY    . amLODipine (NORVASC) 10 MG tablet Take 1 tablet (10 mg total) by mouth daily. 90 tablet 2  . albuterol (VENTOLIN HFA) 108 (90 Base) MCG/ACT inhaler albuterol sulfate HFA 90 mcg/actuation aerosol inhaler  INHALE 2 PUFFS BY MOUTH FOUR TIMES DAILY    . doxycycline (VIBRAMYCIN) 100 MG capsule Take 1 capsule (100 mg total) by mouth 2 (two) times daily. 20 capsule 0  . HYDROcodone-acetaminophen (NORCO/VICODIN) 5-325 MG tablet hydrocodone 5 mg-acetaminophen 325 mg tablet  Take 1 tablet every 6 hours by oral route.    [name] omeprazole (PRILOSEC) 20 MG capsule omeprazole 20 mg capsule,delayed release  TAKE 1 CAPSULE BY MOUTH EVERY DAY (Patient not taking: Reported on 05/31/2020)    . potassium chloride (KLOR-CON) 10 MEQ tablet potassium chloride ER 10 mEq tablet,extended release  TAKE 1 TABLET BY MOUTH TWICE DAILY     No facility-administered medications prior to visit.      Review of Systems  Review of Systems  Constitutional: Negative.   Respiratory: Positive for cough, shortness of breath and wheezing.   Cardiovascular: Positive for leg swelling.    Physical Exam  BP 126/80 (BP Location: Left Arm)   Pulse 81    Ht $R'5\' 2"'WJ$  (1.575 m)   Wt 267 lb 12.8 oz (121.5 kg)   LMP 10/15/2018 Comment: patient had pregnancy test with PCP (test was negative)  SpO2 100%   BMI 48.98 kg/m  Physical Exam Constitutional:      Appearance: Normal appearance.  HENT:     Head: Normocephalic and atraumatic.  Neurological:     Mental Status: She is alert.  Psychiatric:        Mood and Affect: Mood normal.        Behavior: Behavior normal.        Thought Content: Thought content normal.        Judgment: Judgment normal.      Lab Results:  CBC    Component Value Date/Time   WBC 10.9 (H) 05/22/2020 1136   RBC 4.36 05/22/2020 1136   HGB 13.7 05/22/2020 1136   HGB 14.3 08/16/2019 0929   HCT 40.6 05/22/2020 1136   HCT 43.1 08/16/2019 0929   PLT 241.0 05/22/2020 1136   PLT 194 08/16/2019 0929   MCV 93.1 05/22/2020 1136   MCV 95 08/16/2019 0929   MCH 31.5 08/30/2019 0753   MCHC 33.7 05/22/2020 1136   RDW 14.6 05/22/2020 1136   RDW 12.7 08/16/2019 0929   LYMPHSABS 3.1 08/30/2019 0753   LYMPHSABS 2.7 08/16/2019 0929   MONOABS 1.1 (H) 08/30/2019 0753   EOSABS 0.0 08/30/2019 0753   EOSABS 0.1 08/16/2019 0929   BASOSABS 0.0 08/30/2019 0753   BASOSABS 0.0 08/16/2019 0929    BMET    Component Value Date/Time   NA 140 06/21/2020 1004   NA 140 09/21/2019 1023   K 3.8 06/21/2020 1004   CL 103 06/21/2020 1004   CO2 29 06/21/2020 1004   GLUCOSE 123 (H) 06/21/2020 1004   BUN 14 06/21/2020 1004   BUN 32 (H) 09/21/2019 1023   CREATININE 0.65 06/21/2020 1004   CREATININE 0.62 10/09/2016 0857   CALCIUM 9.6 06/21/2020 1004   GFRNONAA 73 09/21/2019 1023   GFRNONAA 69 06/19/2015 1150   GFRAA 85  09/21/2019 1023   GFRAA 80 06/19/2015 1150    BNP    Component Value Date/Time   BNP 26.9 08/28/2019 1100   BNP 15.2 09/18/2016 1058    ProBNP    Component Value Date/Time   PROBNP 16.0 06/21/2020 1004    Imaging: No results found.   Assessment & Plan:   COPD mixed type ([place]) - AECOPD. Patient  reports shortness of breath with wheezing and productive cough. On exam patient had scattered wheezing and rhonchi t/o. Labs normal.  - Continue Breztri two puffs twice daily  - Advised patient to take prednisone taper and mucinex twice daily  - If not better patient to notify office  CHF (congestive heart failure) ([facility]) - BNP wnl, continue lasix at current dose  OSA (obstructive sleep apnea) - Patient reports not getting enough air from CPAP, machine is not working properly and needs repair or new machine. She had sleep study in 2017 and has been compliant ever since with CPAP. - FU 4-6 weeks televisit (if no improvement with new machine would recommend CPAP titration study)   Martyn Ehrich, NP 06/25/2020

## 2020-06-21 NOTE — Assessment & Plan Note (Addendum)
-   BNP wnl, continue lasix at current dose

## 2020-06-25 ENCOUNTER — Encounter: Payer: Self-pay | Admitting: Primary Care

## 2020-06-26 ENCOUNTER — Telehealth: Payer: Self-pay | Admitting: Pulmonary Disease

## 2020-06-26 DIAGNOSIS — G4733 Obstructive sleep apnea (adult) (pediatric): Secondary | ICD-10-CM

## 2020-06-26 NOTE — Telephone Encounter (Signed)
Please change to EPAP minimum 5 cm IPAP maximum 16 cm Pressure support +3 cm

## 2020-06-26 NOTE — Telephone Encounter (Signed)
Called and spoke to patient. Patient feels that bipap pressure is too strong. She is experiencing dry mouth and dry/sore throat.  Per our records, current settings are EPAP 10 max IPAP 16cm PS +3.  DME is [facility].   Dr. [name], please advise.

## 2020-06-27 NOTE — Telephone Encounter (Signed)
Called and spoke with pt letting her know that we were going to send Rx to [facility] with settings change and she verbalized understanding. Order has been placed. Nothing further needed.

## 2020-06-28 ENCOUNTER — Inpatient Hospital Stay (HOSPITAL_COMMUNITY)
Admission: EM | Admit: 2020-06-28 | Discharge: 2020-06-30 | DRG: 190 | Disposition: A | Discharge status: Home / Self Care | Payer: Medicaid Other | Attending: Internal Medicine | Admitting: Internal Medicine

## 2020-06-28 ENCOUNTER — Emergency Department (HOSPITAL_COMMUNITY): Payer: Medicaid Other

## 2020-06-28 ENCOUNTER — Other Ambulatory Visit: Payer: Self-pay

## 2020-06-28 DIAGNOSIS — G4733 Obstructive sleep apnea (adult) (pediatric): Secondary | ICD-10-CM | POA: Diagnosis present

## 2020-06-28 DIAGNOSIS — J441 Chronic obstructive pulmonary disease with (acute) exacerbation: Principal | ICD-10-CM | POA: Diagnosis present

## 2020-06-28 DIAGNOSIS — J45901 Unspecified asthma with (acute) exacerbation: Secondary | ICD-10-CM | POA: Diagnosis present

## 2020-06-28 DIAGNOSIS — Z833 Family history of diabetes mellitus: Secondary | ICD-10-CM

## 2020-06-28 DIAGNOSIS — F319 Bipolar disorder, unspecified: Secondary | ICD-10-CM | POA: Diagnosis present

## 2020-06-28 DIAGNOSIS — Z7984 Long term (current) use of oral hypoglycemic drugs: Secondary | ICD-10-CM

## 2020-06-28 DIAGNOSIS — G8929 Other chronic pain: Secondary | ICD-10-CM | POA: Diagnosis present

## 2020-06-28 DIAGNOSIS — E1140 Type 2 diabetes mellitus with diabetic neuropathy, unspecified: Secondary | ICD-10-CM | POA: Diagnosis present

## 2020-06-28 DIAGNOSIS — M1990 Unspecified osteoarthritis, unspecified site: Secondary | ICD-10-CM | POA: Diagnosis present

## 2020-06-28 DIAGNOSIS — Z6841 Body Mass Index (BMI) 40.0 and over, adult: Secondary | ICD-10-CM

## 2020-06-28 DIAGNOSIS — Z20822 Contact with and (suspected) exposure to covid-19: Secondary | ICD-10-CM | POA: Diagnosis present

## 2020-06-28 DIAGNOSIS — Z809 Family history of malignant neoplasm, unspecified: Secondary | ICD-10-CM

## 2020-06-28 DIAGNOSIS — I110 Hypertensive heart disease with heart failure: Secondary | ICD-10-CM | POA: Diagnosis present

## 2020-06-28 DIAGNOSIS — I10 Essential (primary) hypertension: Secondary | ICD-10-CM | POA: Diagnosis present

## 2020-06-28 DIAGNOSIS — E1169 Type 2 diabetes mellitus with other specified complication: Secondary | ICD-10-CM | POA: Diagnosis present

## 2020-06-28 DIAGNOSIS — F17210 Nicotine dependence, cigarettes, uncomplicated: Secondary | ICD-10-CM | POA: Diagnosis present

## 2020-06-28 DIAGNOSIS — F209 Schizophrenia, unspecified: Secondary | ICD-10-CM | POA: Diagnosis present

## 2020-06-28 DIAGNOSIS — Z79899 Other long term (current) drug therapy: Secondary | ICD-10-CM

## 2020-06-28 DIAGNOSIS — I5032 Chronic diastolic (congestive) heart failure: Secondary | ICD-10-CM | POA: Diagnosis present

## 2020-06-28 DIAGNOSIS — E669 Obesity, unspecified: Secondary | ICD-10-CM | POA: Diagnosis present

## 2020-06-28 DIAGNOSIS — E119 Type 2 diabetes mellitus without complications: Secondary | ICD-10-CM | POA: Diagnosis present

## 2020-06-28 DIAGNOSIS — Z8249 Family history of ischemic heart disease and other diseases of the circulatory system: Secondary | ICD-10-CM

## 2020-06-28 DIAGNOSIS — J9621 Acute and chronic respiratory failure with hypoxia: Secondary | ICD-10-CM | POA: Diagnosis present

## 2020-06-28 DIAGNOSIS — K219 Gastro-esophageal reflux disease without esophagitis: Secondary | ICD-10-CM | POA: Diagnosis present

## 2020-06-28 DIAGNOSIS — Z7951 Long term (current) use of inhaled steroids: Secondary | ICD-10-CM

## 2020-06-28 DIAGNOSIS — Z886 Allergy status to analgesic agent status: Secondary | ICD-10-CM

## 2020-06-28 LAB — RESPIRATORY PANEL BY RT PCR (FLU A&B, COVID)
Influenza A by PCR: NEGATIVE
Influenza B by PCR: NEGATIVE
SARS Coronavirus 2 by RT PCR: NEGATIVE

## 2020-06-28 LAB — COMPREHENSIVE METABOLIC PANEL
ALT: 36 U/L (ref 0–44)
AST: 16 U/L (ref 15–41)
Albumin: 3.6 g/dL (ref 3.5–5.0)
Alkaline Phosphatase: 69 U/L (ref 38–126)
Anion gap: 11 (ref 5–15)
BUN: 19 mg/dL (ref 6–20)
CO2: 25 mmol/L (ref 22–32)
Calcium: 9.7 mg/dL (ref 8.9–10.3)
Chloride: 101 mmol/L (ref 98–111)
Creatinine, Ser: 0.71 mg/dL (ref 0.44–1.00)
GFR, Estimated: >60 mL/min (ref >60)
Glucose, Bld: 129 mg/dL — ABNORMAL HIGH (ref 70–99)
Potassium: 4.4 mmol/L (ref 3.5–5.1)
Sodium: 137 mmol/L (ref 135–145)
Total Bilirubin: 0.6 mg/dL (ref 0.3–1.2)
Total Protein: 6.8 g/dL (ref 6.5–8.1)

## 2020-06-28 LAB — CBC WITH DIFFERENTIAL/PLATELET
Abs Immature Granulocytes: 0.27 10*3/uL — ABNORMAL HIGH (ref 0.00–0.07)
Basophils Absolute: 0.1 10*3/uL (ref 0.0–0.1)
Basophils Relative: 0 %
Eosinophils Absolute: 0.3 10*3/uL (ref 0.0–0.5)
Eosinophils Relative: 2 %
HCT: 39.3 % (ref 36.0–46.0)
Hemoglobin: 12.5 g/dL (ref 12.0–15.0)
Immature Granulocytes: 2 %
Lymphocytes Relative: 21 %
Lymphs Abs: 2.9 10*3/uL (ref 0.7–4.0)
MCH: 30.4 pg (ref 26.0–34.0)
MCHC: 31.8 g/dL (ref 30.0–36.0)
MCV: 95.6 fL (ref 80.0–100.0)
Monocytes Absolute: 0.9 10*3/uL (ref 0.1–1.0)
Monocytes Relative: 7 %
Neutro Abs: 9.2 10*3/uL — ABNORMAL HIGH (ref 1.7–7.7)
Neutrophils Relative %: 68 %
Platelets: 265 10*3/uL (ref 150–400)
RBC: 4.11 MIL/uL (ref 3.87–5.11)
RDW: 14.7 % (ref 11.5–15.5)
WBC: 13.6 10*3/uL — ABNORMAL HIGH (ref 4.0–10.5)
nRBC: 0 % (ref 0.0–0.2)

## 2020-06-28 LAB — TROPONIN I (HIGH SENSITIVITY)
Troponin I (High Sensitivity): 7 ng/L (ref <18)
Troponin I (High Sensitivity): 6 ng/L (ref <18)

## 2020-06-28 LAB — GLUCOSE, CAPILLARY
Glucose-Capillary: 260 mg/dL — ABNORMAL HIGH (ref 70–99)
Glucose-Capillary: 336 mg/dL — ABNORMAL HIGH (ref 70–99)

## 2020-06-28 LAB — BRAIN NATRIURETIC PEPTIDE: B Natriuretic Peptide: 32.9 pg/mL (ref 0.0–100.0)

## 2020-06-28 LAB — CBG MONITORING, ED: Glucose-Capillary: 253 mg/dL — ABNORMAL HIGH (ref 70–99)

## 2020-06-28 MED ORDER — GABAPENTIN 300 MG PO CAPS
300.0000 mg | ORAL_CAPSULE | Freq: Three times a day (TID) | ORAL | Status: DC
  Administered 2020-06-28 – 2020-06-30 (×7): 300 mg via ORAL
  Filled 2020-06-28 (×7): qty 1

## 2020-06-28 MED ORDER — HYDRALAZINE HCL 20 MG/ML IJ SOLN: 5.0000 mg | INTRAMUSCULAR | Status: DC | PRN

## 2020-06-28 MED ORDER — LACTATED RINGERS IV SOLN: INTRAVENOUS | Status: DC

## 2020-06-28 MED ORDER — IRBESARTAN 75 MG PO TABS
75.0000 mg | ORAL_TABLET | Freq: Every day | ORAL | Status: DC
  Administered 2020-06-28 – 2020-06-30 (×3): 75 mg via ORAL
  Filled 2020-06-28 (×3): qty 1

## 2020-06-28 MED ORDER — NICOTINE 14 MG/24HR TD PT24
14.0000 mg | MEDICATED_PATCH | Freq: Every day | TRANSDERMAL | Status: DC
  Administered 2020-06-28 – 2020-06-30 (×3): 14 mg via TRANSDERMAL
  Filled 2020-06-28 (×3): qty 1

## 2020-06-28 MED ORDER — IPRATROPIUM BROMIDE 0.02 % IN SOLN
0.5000 mg | Freq: Once | RESPIRATORY_TRACT | Status: AC
  Administered 2020-06-28: 0.5 mg via RESPIRATORY_TRACT
  Filled 2020-06-28: qty 2.5

## 2020-06-28 MED ORDER — BISOPROLOL FUMARATE 5 MG PO TABS
5.0000 mg | ORAL_TABLET | Freq: Two times a day (BID) | ORAL | Status: DC
  Administered 2020-06-28 – 2020-06-30 (×5): 5 mg via ORAL
  Filled 2020-06-28 (×6): qty 1

## 2020-06-28 MED ORDER — DOCUSATE SODIUM 100 MG PO CAPS
100.0000 mg | ORAL_CAPSULE | Freq: Two times a day (BID) | ORAL | Status: DC
  Filled 2020-06-28 (×4): qty 1

## 2020-06-28 MED ORDER — MORPHINE SULFATE (PF) 2 MG/ML IV SOLN: 2.0000 mg | INTRAVENOUS | Status: DC | PRN

## 2020-06-28 MED ORDER — SODIUM CHLORIDE 0.9 % IV SOLN
500.0000 mg | INTRAVENOUS | Status: AC
  Administered 2020-06-28: 500 mg via INTRAVENOUS
  Filled 2020-06-28: qty 500

## 2020-06-28 MED ORDER — AMLODIPINE BESYLATE 10 MG PO TABS
10.0000 mg | ORAL_TABLET | Freq: Every day | ORAL | Status: DC
  Administered 2020-06-28 – 2020-06-30 (×3): 10 mg via ORAL
  Filled 2020-06-28: qty 1
  Filled 2020-06-28: qty 2
  Filled 2020-06-28: qty 1

## 2020-06-28 MED ORDER — AZITHROMYCIN 500 MG PO TABS
500.0000 mg | ORAL_TABLET | Freq: Every day | ORAL | Status: DC
  Administered 2020-06-29 – 2020-06-30 (×2): 500 mg via ORAL
  Filled 2020-06-28 (×2): qty 1

## 2020-06-28 MED ORDER — FUROSEMIDE 80 MG PO TABS
80.0000 mg | ORAL_TABLET | Freq: Two times a day (BID) | ORAL | Status: DC
  Administered 2020-06-28 – 2020-06-30 (×5): 80 mg via ORAL
  Filled 2020-06-28 (×3): qty 1
  Filled 2020-06-28: qty 4
  Filled 2020-06-28: qty 1

## 2020-06-28 MED ORDER — HYDROCODONE-ACETAMINOPHEN 5-325 MG PO TABS
1.0000 | ORAL_TABLET | ORAL | Status: DC | PRN
  Administered 2020-06-28: 1 via ORAL
  Administered 2020-06-28: 2 via ORAL
  Filled 2020-06-28: qty 2
  Filled 2020-06-28: qty 1

## 2020-06-28 MED ORDER — ZOLPIDEM TARTRATE 5 MG PO TABS: 5.0000 mg | ORAL_TABLET | Freq: Every evening | ORAL | Status: DC | PRN

## 2020-06-28 MED ORDER — HYDROXYZINE PAMOATE 25 MG PO CAPS
25.0000 mg | ORAL_CAPSULE | Freq: Every day | ORAL | Status: DC
  Filled 2020-06-28: qty 1

## 2020-06-28 MED ORDER — UMECLIDINIUM BROMIDE 62.5 MCG/INH IN AEPB
1.0000 | INHALATION_SPRAY | Freq: Every day | RESPIRATORY_TRACT | Status: DC
  Administered 2020-06-29 – 2020-06-30 (×2): 1 via RESPIRATORY_TRACT
  Filled 2020-06-28: qty 7

## 2020-06-28 MED ORDER — METHOCARBAMOL 500 MG PO TABS
500.0000 mg | ORAL_TABLET | Freq: Four times a day (QID) | ORAL | Status: DC | PRN
  Administered 2020-06-28 – 2020-06-29 (×2): 500 mg via ORAL
  Filled 2020-06-28 (×2): qty 1

## 2020-06-28 MED ORDER — MONTELUKAST SODIUM 10 MG PO TABS
10.0000 mg | ORAL_TABLET | Freq: Every day | ORAL | Status: DC
  Administered 2020-06-28 – 2020-06-29 (×2): 10 mg via ORAL
  Filled 2020-06-28 (×2): qty 1

## 2020-06-28 MED ORDER — ENOXAPARIN SODIUM 40 MG/0.4ML ~~LOC~~ SOLN
40.0000 mg | SUBCUTANEOUS | Status: DC
  Administered 2020-06-28 – 2020-06-29 (×2): 40 mg via SUBCUTANEOUS
  Filled 2020-06-28 (×2): qty 0.4

## 2020-06-28 MED ORDER — BUDESON-GLYCOPYRROL-FORMOTEROL 160-9-4.8 MCG/ACT IN AERO: 2.0000 | INHALATION_SPRAY | Freq: Two times a day (BID) | RESPIRATORY_TRACT | Status: DC

## 2020-06-28 MED ORDER — MAGNESIUM SULFATE 2 GM/50ML IV SOLN
2.0000 g | Freq: Once | INTRAVENOUS | Status: AC
  Administered 2020-06-28: 2 g via INTRAVENOUS
  Filled 2020-06-28: qty 50

## 2020-06-28 MED ORDER — PANTOPRAZOLE SODIUM 40 MG PO TBEC
40.0000 mg | DELAYED_RELEASE_TABLET | Freq: Every day | ORAL | Status: DC
  Administered 2020-06-28 – 2020-06-30 (×3): 40 mg via ORAL
  Filled 2020-06-28 (×3): qty 1

## 2020-06-28 MED ORDER — BISACODYL 5 MG PO TBEC: 5.0000 mg | DELAYED_RELEASE_TABLET | Freq: Every day | ORAL | Status: DC | PRN

## 2020-06-28 MED ORDER — ALBUTEROL SULFATE (2.5 MG/3ML) 0.083% IN NEBU: 2.5000 mg | INHALATION_SOLUTION | RESPIRATORY_TRACT | Status: DC | PRN

## 2020-06-28 MED ORDER — TRIAMTERENE-HCTZ 37.5-25 MG PO TABS
1.0000 | ORAL_TABLET | Freq: Every day | ORAL | Status: DC
  Administered 2020-06-28 – 2020-06-30 (×3): 1 via ORAL
  Filled 2020-06-28 (×3): qty 1

## 2020-06-28 MED ORDER — METHYLPREDNISOLONE SODIUM SUCC 125 MG IJ SOLR
80.0000 mg | Freq: Two times a day (BID) | INTRAMUSCULAR | Status: AC
  Administered 2020-06-28 (×2): 80 mg via INTRAVENOUS
  Filled 2020-06-28 (×2): qty 2

## 2020-06-28 MED ORDER — POLYETHYLENE GLYCOL 3350 17 G PO PACK: 17.0000 g | PACK | Freq: Every day | ORAL | Status: DC | PRN

## 2020-06-28 MED ORDER — OXYCODONE HCL 5 MG PO TABS
10.0000 mg | ORAL_TABLET | Freq: Four times a day (QID) | ORAL | Status: DC | PRN
  Administered 2020-06-28 – 2020-06-30 (×4): 10 mg via ORAL
  Filled 2020-06-28 (×4): qty 2

## 2020-06-28 MED ORDER — PREDNISONE 20 MG PO TABS
40.0000 mg | ORAL_TABLET | Freq: Every day | ORAL | Status: DC
  Administered 2020-06-29 – 2020-06-30 (×2): 40 mg via ORAL
  Filled 2020-06-28 (×2): qty 2

## 2020-06-28 MED ORDER — SODIUM CHLORIDE 0.9% FLUSH
3.0000 mL | Freq: Two times a day (BID) | INTRAVENOUS | Status: DC
  Administered 2020-06-28 – 2020-06-30 (×4): 3 mL via INTRAVENOUS

## 2020-06-28 MED ORDER — GUAIFENESIN ER 600 MG PO TB12: 600.0000 mg | ORAL_TABLET | Freq: Two times a day (BID) | ORAL | Status: DC | PRN

## 2020-06-28 MED ORDER — ONDANSETRON HCL 4 MG PO TABS: 4.0000 mg | ORAL_TABLET | Freq: Four times a day (QID) | ORAL | Status: DC | PRN

## 2020-06-28 MED ORDER — IPRATROPIUM-ALBUTEROL 0.5-2.5 (3) MG/3ML IN SOLN
3.0000 mL | Freq: Four times a day (QID) | RESPIRATORY_TRACT | Status: DC
  Administered 2020-06-28 – 2020-06-29 (×5): 3 mL via RESPIRATORY_TRACT
  Filled 2020-06-28 (×5): qty 3

## 2020-06-28 MED ORDER — INSULIN ASPART 100 UNIT/ML ~~LOC~~ SOLN
0.0000 [IU] | Freq: Three times a day (TID) | SUBCUTANEOUS | Status: DC
  Administered 2020-06-28 – 2020-06-29 (×3): 8 [IU] via SUBCUTANEOUS
  Administered 2020-06-29: 3 [IU] via SUBCUTANEOUS
  Administered 2020-06-29: 5 [IU] via SUBCUTANEOUS
  Administered 2020-06-30: 3 [IU] via SUBCUTANEOUS
  Administered 2020-06-30: 8 [IU] via SUBCUTANEOUS

## 2020-06-28 MED ORDER — POTASSIUM CHLORIDE CRYS ER 20 MEQ PO TBCR
40.0000 meq | EXTENDED_RELEASE_TABLET | Freq: Two times a day (BID) | ORAL | Status: DC
  Administered 2020-06-28 – 2020-06-30 (×5): 40 meq via ORAL
  Filled 2020-06-28 (×5): qty 2

## 2020-06-28 MED ORDER — ACETAMINOPHEN 650 MG RE SUPP: 650.0000 mg | Freq: Four times a day (QID) | RECTAL | Status: DC | PRN

## 2020-06-28 MED ORDER — HYDROXYZINE HCL 25 MG PO TABS
25.0000 mg | ORAL_TABLET | Freq: Every day | ORAL | Status: DC
  Administered 2020-06-28 – 2020-06-29 (×2): 25 mg via ORAL
  Filled 2020-06-28 (×2): qty 1

## 2020-06-28 MED ORDER — INSULIN ASPART 100 UNIT/ML ~~LOC~~ SOLN
0.0000 [IU] | Freq: Every day | SUBCUTANEOUS | Status: DC
  Administered 2020-06-28: 5 [IU] via SUBCUTANEOUS
  Administered 2020-06-29: 3 [IU] via SUBCUTANEOUS

## 2020-06-28 MED ORDER — METHYLPREDNISOLONE SODIUM SUCC 125 MG IJ SOLR
125.0000 mg | Freq: Once | INTRAMUSCULAR | Status: AC
  Administered 2020-06-28: 125 mg via INTRAVENOUS
  Filled 2020-06-28: qty 2

## 2020-06-28 MED ORDER — ALBUTEROL SULFATE (2.5 MG/3ML) 0.083% IN NEBU
5.0000 mg | INHALATION_SOLUTION | Freq: Once | RESPIRATORY_TRACT | Status: AC
  Administered 2020-06-28: 5 mg via RESPIRATORY_TRACT
  Filled 2020-06-28: qty 6

## 2020-06-28 MED ORDER — ARIPIPRAZOLE 10 MG PO TABS
20.0000 mg | ORAL_TABLET | Freq: Every day | ORAL | Status: DC
  Administered 2020-06-28 – 2020-06-29 (×2): 20 mg via ORAL
  Filled 2020-06-28 (×3): qty 2
  Filled 2020-06-28 (×2): qty 4

## 2020-06-28 MED ORDER — FLUTICASONE FUROATE-VILANTEROL 100-25 MCG/INH IN AEPB
1.0000 | INHALATION_SPRAY | Freq: Every day | RESPIRATORY_TRACT | Status: DC
  Administered 2020-06-29 – 2020-06-30 (×2): 1 via RESPIRATORY_TRACT
  Filled 2020-06-28: qty 28

## 2020-06-28 MED ORDER — ALBUTEROL (5 MG/ML) CONTINUOUS INHALATION SOLN
10.0000 mg/h | INHALATION_SOLUTION | Freq: Once | RESPIRATORY_TRACT | Status: AC
  Administered 2020-06-28: 10 mg/h via RESPIRATORY_TRACT
  Filled 2020-06-28: qty 20

## 2020-06-28 MED ORDER — ACETAMINOPHEN 325 MG PO TABS: 650.0000 mg | ORAL_TABLET | Freq: Four times a day (QID) | ORAL | Status: DC | PRN

## 2020-06-28 MED ORDER — ONDANSETRON HCL 4 MG/2ML IJ SOLN: 4.0000 mg | Freq: Four times a day (QID) | INTRAMUSCULAR | Status: DC | PRN

## 2020-06-28 NOTE — ED Triage Notes (Signed)
Pt has hx of CHF and pt said x 6 days she has been coughing with congestion. Pt is very clammy and sweaty in triage. No chest pain

## 2020-06-28 NOTE — ED Provider Notes (Signed)
[facility] EMERGENCY DEPARTMENT Provider Note   CSN: [id number] Arrival date & time: 06/28/20  0157     History Chief Complaint  Patient presents with  . Shortness of Breath  . Cough    Lori Bean is a 49 y.o. female.  Patient presents to the emergency department for evaluation of shortness of breath.  Patient has been experiencing cough, chest congestion and shortness of breath for 6 days.  Patient does have a history of asthma.  She reports that her nebulizer is broken but she has been using her inhaler with no significant improvement.  No associated chest pain.  She has not had any fever.        Past Medical History:  Diagnosis Date  . Abdominal hernia   . Anginal pain ([place])    occ from asthma  . Anxiety   . Arthritis   . Asthma   . Bipolar affective disorder ([place])    Schizophrenia  . Bronchitis    hx of  . CHF (congestive heart failure) ([place])   . Depression   . Diabetes mellitus without complication ([place])    " New Onset " per patient  . GERD (gastroesophageal reflux disease)   . Heart murmur   . Hypertension    takes meds  . Lymphedema 06/07/2018  . Morbid (severe) obesity due to excess calories ([place])   . Neuropathy    left leg , "from back surgery"  . Overactive bladder   . Schizophrenia ([place])   . Sciatica   . Shortness of breath   . Sleep apnea   . Snoring disorder    Pt stated my boyfriend always wakes me up and tells me to breathe    Patient Active Problem List   Diagnosis Date Noted  . Acute on chronic respiratory failure ([place]) 09/01/2019  . COPD with acute exacerbation ([place]) 08/28/2019  . COPD exacerbation ([place]) 08/28/2019  . Acute bronchitis 08/16/2019  . Tongue cancer ([place]) 02/14/2019  . 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 Sinusitis 07/22/2018  . Lymphedema 06/07/2018  . Abnormal uterine bleeding (AUB) 12/12/2017  . CHF (congestive heart failure) ([place])   . Chronic asthma, mild persistent, uncomplicated  18/29/9371  . Dyspnea on exertion 05/05/2017  . Asthma exacerbation in COPD ([place]) 05/05/2017  . Morbid obesity due to excess calories ([place]) 05/05/2017  . Asthma exacerbation 12/17/2016  . 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 Synovial plica of right knee 69/67/8938  . Tear of medial meniscus of right knee, initial encounter 09/28/2016  . OSA (obstructive sleep apnea) 09/23/2016  . Chronic diastolic heart failure ([place]) 04/22/2016  . Prediabetes 04/22/2016  . Cigarette smoker 04/03/2014  . Chest pain 04/03/2014  . Lumbar spondylosis 12/28/2013  . Extrinsic asthma 06/07/2013  . Anxiety   . Essential hypertension   . Bipolar affective disorder ([place])   . GERD (gastroesophageal reflux disease)   . Arthritis   . Schizophrenia ([place])   . Overactive bladder     Past Surgical History:  Procedure Laterality Date  . BACK SURGERY     3 back surgeries  . CHOLECYSTECTOMY    . COLONOSCOPY WITH PROPOFOL N/A 01/23/2016   Procedure: COLONOSCOPY WITH PROPOFOL;  Surgeon: Wonda Horner, MD;  Location: [facility];  Service: Endoscopy;  Laterality: N/A;  . EYE SURGERY     Metal plate in right eye. Had fracture in right eye  . gallstones reomved    . KNEE ARTHROSCOPY WITH MENISCAL REPAIR Right 09/28/2016  Procedure: KNEE ARTHROSCOPY WITH MENISCAL REPAIR;  Surgeon: Dorna Leitz, MD;  Location: [facility];  Service: Orthopedics;  Laterality: Right;  Right partial meniscectomy and chondroplasty, patellar/femoral joint and medial femoral condyle   . LUMBAR LAMINECTOMY/DECOMPRESSION MICRODISCECTOMY  04/01/2012   Procedure: LUMBAR LAMINECTOMY/DECOMPRESSION MICRODISCECTOMY 2 LEVELS;  Surgeon: Faythe Ghee, MD;  Location: [facility];  Service: Neurosurgery;  Laterality: Left;  Lumbar four-five, lumbar five sacral one microdiscectomy   . LUMBAR WOUND DEBRIDEMENT  04/29/2012   Procedure: LUMBAR WOUND DEBRIDEMENT;  Surgeon: Faythe Ghee, MD;  Location: [facility];  Service: Neurosurgery;  Laterality: N/A;   lumbar wound debridement  . ROTATOR CUFF REPAIR     Right shoulder  . TUBAL LIGATION       OB History    Gravida  4   Para  2   Term  0   Preterm  2   AB  2   Living  2     SAB  1   TAB  1   Ectopic      Multiple      Live Births  2           Family History  Problem Relation Age of Onset  . Diabetes Mother   . Hypertension Mother   . Diabetes Father   . Heart disease Paternal Aunt   . Cancer Paternal Aunt   . Esophageal cancer Neg Hx   . Stomach cancer Neg Hx     Social History   Tobacco Use  . Smoking status: Current Every Day Smoker    Packs/day: 1.00    Years: 24.00    Pack years: 24.00    Types: Cigarettes  . Smokeless tobacco: Never Used  Vaping Use  . Vaping Use: Every day  . Substances: Nicotine  Substance Use Topics  . Alcohol use: No    Alcohol/week: 0.0 standard drinks    Comment: quit Nov. 2014  . Drug use: No    Home Medications Prior to Admission medications   Medication Sig Start Date End Date Taking? Authorizing Provider  albuterol (PROAIR HFA) 108 (90 Base) MCG/ACT inhaler Inhale 2 puffs into the lungs every 4 (four) hours as needed for wheezing or shortness of breath.    [provider]  albuterol (PROVENTIL) (2.5 MG/3ML) 0.083% nebulizer solution Take 2.5 mg by nebulization every 4 (four) hours as needed for wheezing or shortness of breath.    [provider]  amLODipine (NORVASC) 10 MG tablet Take 1 tablet (10 mg total) by mouth daily. 08/31/19 05/27/20  Nita Sells, MD  amLODipine (NORVASC) 10 MG tablet Take 10 mg by mouth daily.    [provider]  ARIPiprazole (ABILIFY) 20 MG tablet Take 1 tablet (20 mg total) by mouth at bedtime. 03/04/15   Lance Bosch, NP  bisoprolol (ZEBETA) 5 MG tablet One twice daily 01/23/20   Erlene Quan, PA-C  blood glucose meter kit and supplies KIT Dispense based on patient and insurance preference. Use up to four times daily as directed. (FOR ICD-9  250.00, 250.01). 08/31/19   Nita Sells, MD  Budeson-Glycopyrrol-Formoterol (BREZTRI AEROSPHERE) 160-9-4.8 MCG/ACT AERO Inhale 2 puffs into the lungs 2 (two) times daily. 09/18/19   Tanda Rockers, MD  chlorhexidine (PERIDEX) 0.12 % solution Use as directed 15 mLs in the mouth or throat 2 (two) times daily. 06/03/20   Wardell Honour, MD  diphenoxylate-atropine (LOMOTIL) 2.5-0.025 MG tablet Take 1 tablet by mouth 2 (two) times  daily. 05/22/20   Willia Craze, NP  fluticasone (CUTIVATE) 0.05 % cream fluticasone propionate 0.05 % topical cream  APPLY EXTERNALLY TO THE AFFECTED AREA TWICE DAILY FOR 14 DAYS    [provider]  furosemide (LASIX) 80 MG tablet Take $RemoveBef'80mg'QfPfAqpqad$  in the morning and $RemoveBef'40mg'GsJNxmGNau$  in the evening. Patient taking differently: 80 mg 2 (two) times daily. Take $RemoveBef'80mg'NlLFiZADBl$  in the morning and $RemoveBef'80mg'esRkHqGtew$  in the evening. 09/26/19   Erlene Quan, PA-C  gabapentin (NEURONTIN) 300 MG capsule Take 300 mg by mouth 3 (three) times daily.  02/02/19   [provider]  glimepiride (AMARYL) 1 MG tablet Take 1 tablet (1 mg total) by mouth daily with breakfast. 08/31/19   Nita Sells, MD  glipiZIDE (GLUCOTROL XL) 10 MG 24 hr tablet glipizide ER 10 mg tablet, extended release 24 hr  TAKE 1 TABLET BY MOUTH EVERY DAY    [provider]  hydrOXYzine (VISTARIL) 25 MG capsule hydroxyzine pamoate 25 mg capsule  TAKE 1 CAPSULE BY MOUTH EVERY NIGHT AT BEDTIME    [provider]  ipratropium (ATROVENT HFA) 17 MCG/ACT inhaler Atrovent HFA 17 mcg/actuation aerosol inhaler  INHALE 2 PUFFS INTO THE LUNGS EVERY 6 HOURS    [provider]  metFORMIN (GLUCOPHAGE) 1000 MG tablet metformin 1,000 mg tablet  TAKE 1 TABLET BY MOUTH TWICE DAILY    [provider]  montelukast (SINGULAIR) 10 MG tablet Take 1 tablet (10 mg total) by mouth at bedtime. 09/20/19   Martyn Ehrich, NP  omeprazole (PRILOSEC) 40 MG capsule Take 1 capsule (40 mg total) by mouth daily. 12/14/17    [name], MD  potassium chloride SA (K-DUR,KLOR-CON) 20 MEQ tablet Take 40 mEq by mouth 2 (two) times daily.     [provider]  PRESCRIPTION MEDICATION Inhale into the lungs at bedtime. CPAP    [provider]  Respiratory Therapy Supplies (FLUTTER) DEVI Use flutter device 3 times a day 07/20/18   Martyn Ehrich, NP  Triamcinolone Acetonide 0.025 % LOTN Apply 1 application topically 3 (three) times daily.    [provider]  triamterene-hydrochlorothiazide (MAXZIDE-25) 37.5-25 MG tablet triamterene 37.5 mg-hydrochlorothiazide 25 mg tablet  TAKE 1 TABLET BY MOUTH DAILY    [provider]  valsartan (DIOVAN) 80 MG tablet valsartan 80 mg tablet  TAKE 1 TABLET BY MOUTH EVERY DAY    [provider]    Allergies    Aspirin  Review of Systems   Review of Systems  Respiratory: Positive for cough, shortness of breath and wheezing.   All other systems reviewed and are negative.   Physical Exam Updated Vital Signs BP (!) 150/89   Pulse (!) 102   Temp 98.3 F (36.8 C) (Oral)   Resp (!) 25   LMP 10/15/2018 Comment: patient had pregnancy test with PCP (test was negative)  SpO2 94%   Physical Exam Vitals and nursing note reviewed.  Constitutional:      General: She is not in acute distress.    Appearance: Normal appearance. She is well-developed.  HENT:     Head: Normocephalic and atraumatic.     Right Ear: Hearing normal.     Left Ear: Hearing normal.     Nose: Nose normal.  Eyes:     Conjunctiva/sclera: Conjunctivae normal.     Pupils: Pupils are equal, round, and reactive to light.  Cardiovascular:     Rate and Rhythm: Regular rhythm. Tachycardia present.     Heart sounds: S1 normal  and S2 normal. No murmur heard.  No friction rub. No gallop.   Pulmonary:     Effort: Pulmonary effort is normal. Tachypnea present. No respiratory distress.     Breath sounds: Decreased breath sounds and wheezing present.  Chest:     Chest  wall: No tenderness.  Abdominal:     General: Bowel sounds are normal.     Palpations: Abdomen is soft.     Tenderness: There is no abdominal tenderness. There is no guarding or rebound. Negative signs include Murphy's sign and McBurney's sign.     Hernia: No hernia is present.  Musculoskeletal:        General: Normal range of motion.     Cervical back: Normal range of motion and neck supple.  Skin:    General: Skin is warm and dry.     Findings: No rash.  Neurological:     Mental Status: She is alert and oriented to person, place, and time.     GCS: GCS eye subscore is 4. GCS verbal subscore is 5. GCS motor subscore is 6.     Cranial Nerves: No cranial nerve deficit.     Sensory: No sensory deficit.     Coordination: Coordination normal.  Psychiatric:        Speech: Speech normal.        Behavior: Behavior normal.        Thought Content: Thought content normal.     ED Results / Procedures / Treatments   Labs (all labs ordered are listed, but only abnormal results are displayed) Labs Reviewed  CBC WITH DIFFERENTIAL/PLATELET - Abnormal; Notable for the following components:      Result Value   WBC 13.6 (*)    Neutro Abs 9.2 (*)    Abs Immature Granulocytes 0.27 (*)    All other components within normal limits  COMPREHENSIVE METABOLIC PANEL - Abnormal; Notable for the following components:   Glucose, Bld 129 (*)    All other components within normal limits  RESPIRATORY PANEL BY RT PCR (FLU A&B, COVID)  BRAIN NATRIURETIC PEPTIDE  TROPONIN I (HIGH SENSITIVITY)  TROPONIN I (HIGH SENSITIVITY)    EKG EKG Interpretation  Date/Time:  Friday June 28 2020 02:06:53 EDT Ventricular Rate:  90 PR Interval:  206 QRS Duration: 82 QT Interval:  370 QTC Calculation: 452 R Axis:   3 Text Interpretation: Normal sinus rhythm Cannot rule out Anterior infarct , age undetermined Abnormal ECG Confirmed by Orpah Greek [phone]) on 06/28/2020 3:40:40 AM   Radiology DG Chest  2 View  Result Date: 06/28/2020 CLINICAL DATA:  Dyspnea EXAM: CHEST - 2 VIEW COMPARISON:  05/13/2020 FINDINGS: Mild right basilar atelectasis is present. Lungs are otherwise clear. No pneumothorax or pleural effusion. Cardiac size within normal limits. Pulmonary vascularity is normal. No acute bone abnormality. IMPRESSION: No active cardiopulmonary disease. Electronically Signed   By: Fidela Salisbury MD   On: 06/28/2020 02:56    Procedures Procedures (including critical care time)  Medications Ordered in ED Medications  albuterol (PROVENTIL) (2.5 MG/3ML) 0.083% nebulizer solution 5 mg (5 mg Nebulization Given 06/28/20 0229)  albuterol (PROVENTIL,VENTOLIN) solution continuous neb (10 mg/hr Nebulization Given 06/28/20 0502)  ipratropium (ATROVENT) nebulizer solution 0.5 mg (0.5 mg Nebulization Given 06/28/20 0502)  methylPREDNISolone sodium succinate (SOLU-MEDROL) 125 mg/2 mL injection 125 mg (125 mg Intravenous Given 06/28/20 0534)  magnesium sulfate IVPB 2 g 50 mL (2 g Intravenous New Bag/Given 06/28/20 0532)    ED Course  I have reviewed  the triage vital signs and the nursing notes.  Pertinent labs & imaging results that were available during my care of the patient were reviewed by me and considered in my medical decision making (see chart for details).    MDM Rules/Calculators/A&P                          Patient presents to the emergency department for evaluation of cough, chest congestion, shortness of breath.  She does have a history of asthma.  She has been using albuterol by inhaler without improvement.  Patient with bronchospasm on initial examination.  EKG unchanged.  Cardiac evaluation negative.  No evidence of congestive heart failure, ischemia or infarct.  Chest x-ray does not show evidence of pneumonia or infiltrate.  Covid testing was negative.  Patient administered magnesium, Solu-Medrol, continuous nebulizer treatment.  She remains dyspneic and is wheezing.  Room air oxygen  saturation is 86% at rest, will require hospitalization.  CRITICAL CARE Performed by: Orpah Greek   Total critical care time: 35 minutes  Critical care time was exclusive of separately billable procedures and treating other patients.  Critical care was necessary to treat or prevent imminent or life-threatening deterioration.  Critical care was time spent personally by me on the following activities: development of treatment plan with patient and/or surrogate as well as nursing, discussions with consultants, evaluation of patient's response to treatment, examination of patient, obtaining history from patient or surrogate, ordering and performing treatments and interventions, ordering and review of laboratory studies, ordering and review of radiographic studies, pulse oximetry and re-evaluation of patient's condition.   Final Clinical Impression(s) / ED Diagnoses Final diagnoses:  Severe asthma with exacerbation, unspecified whether persistent    Rx / DC Orders ED Discharge Orders    None       Shawneequa Baldridge, Gwenyth Allegra, MD 06/28/20 [phone]

## 2020-06-28 NOTE — ED Notes (Signed)
Patient provided sandwich and sprite, per he request.

## 2020-06-28 NOTE — ED Notes (Signed)
O2 sats consistently 90-91% on room air, patient placed on 3L supplemental O2 via n.c. with improvement in sats to 94%.

## 2020-06-28 NOTE — H&P (Signed)
History and Physical    Lori Bean MOQ:[id number] DOB: July 27, 1971 DOA: 06/28/2020  PCP: [name], PA Consultants:  [name] - pulmonology; Ivonne Andrew - cardiology Patient coming from:  Home - lives with boyfriend; NOK: Ladona Ridgel [place], [phone]  Chief Complaint: SOB  HPI: Lori Bean is a 49 y.o. female with medical history significant of schizophrenia/bipolar; morbid obesity (BMI 48.98); HTN; DM; OSA on CPAP; chronic diastolic CHF; and COPD with ongoing tobacco use presenting with COPD exacerbation.  She reports having SOB for 6+ days and getting worse.  Her CPAP and neb stopped working and she ran out of her inhaler night before last and so she came in.  She can't move bed to bath without getting SOB.  +cough productive of thick brownish mucus.  ?fever, couldn't find thermometer.      ED Course: Asthmatic - neb machine broke and sick for  Days.  CXR clear.  COVID negative.  Tight at first, given continuous neb, Solumedrol, Mag++.  Deset to 80s after neb, on 3L.  Review of Systems: As per HPI; otherwise review of systems reviewed and negative.   Ambulatory Status:  Ambulates with a cane or walker  COVID Vaccine Status:   Complete  Past Medical History:  Diagnosis Date  . Abdominal hernia   . Anginal pain ([place])    occ from asthma  . Anxiety   . Arthritis   . Asthma   . Bipolar affective disorder ([place])    Schizophrenia  . Bronchitis    hx of  . CHF (congestive heart failure) ([place])   . Depression   . Diabetes mellitus without complication ([place])    " New Onset " per patient  . GERD (gastroesophageal reflux disease)   . Heart murmur   . Hypertension    takes meds  . Lymphedema 06/07/2018  . Morbid (severe) obesity due to excess calories ([place])   . Neuropathy    left leg , "from back surgery"  . Overactive bladder   . Schizophrenia ([place])   . Sciatica   . Shortness of breath   . Sleep apnea   . Snoring disorder    Pt stated my  boyfriend always wakes me up and tells me to breathe    Past Surgical History:  Procedure Laterality Date  . BACK SURGERY     3 back surgeries  . CHOLECYSTECTOMY    . COLONOSCOPY WITH PROPOFOL N/A 01/23/2016   Procedure: COLONOSCOPY WITH PROPOFOL;  Surgeon: Wonda Horner, MD;  Location: [facility];  Service: Endoscopy;  Laterality: N/A;  . EYE SURGERY     Metal plate in right eye. Had fracture in right eye  . gallstones reomved    . KNEE ARTHROSCOPY WITH MENISCAL REPAIR Right 09/28/2016   Procedure: KNEE ARTHROSCOPY WITH MENISCAL REPAIR;  Surgeon: Dorna Leitz, MD;  Location: [facility];  Service: Orthopedics;  Laterality: Right;  Right partial meniscectomy and chondroplasty, patellar/femoral joint and medial femoral condyle   . LUMBAR LAMINECTOMY/DECOMPRESSION MICRODISCECTOMY  04/01/2012   Procedure: LUMBAR LAMINECTOMY/DECOMPRESSION MICRODISCECTOMY 2 LEVELS;  Surgeon: Faythe Ghee, MD;  Location: [facility];  Service: Neurosurgery;  Laterality: Left;  Lumbar four-five, lumbar five sacral one microdiscectomy   . LUMBAR WOUND DEBRIDEMENT  04/29/2012   Procedure: LUMBAR WOUND DEBRIDEMENT;  Surgeon: Faythe Ghee, MD;  Location: [facility];  Service: Neurosurgery;  Laterality: N/A;  lumbar wound debridement  . ROTATOR CUFF REPAIR     Right shoulder  .  TUBAL LIGATION      Social History   Socioeconomic History  . Marital status: Divorced    Spouse name: Not on file  . Number of children: 2  . Years of education: 86  . Highest education level: Not on file  Occupational History    Comment: disabled  Tobacco Use  . Smoking status: Current Every Day Smoker    Packs/day: 1.00    Years: 24.00    Pack years: 24.00    Types: Cigarettes  . Smokeless tobacco: Never Used  Vaping Use  . Vaping Use: Every day  . Substances: Nicotine  Substance and Sexual Activity  . Alcohol use: No    Alcohol/week: 0.0 standard drinks    Comment: quit Nov. 2014  . Drug use: No  . Sexual activity:  Yes    Birth control/protection: Surgical  Other Topics Concern  . Not on file  Social History Narrative   Lives alone   Caffeine - coffee 1 cup daily   Social Determinants of Health   Financial Resource Strain:   . Difficulty of Paying Living Expenses: Not on file  Food Insecurity:   . Worried About Charity fundraiser in the Last Year: Not on file  . Ran Out of Food in the Last Year: Not on file  Transportation Needs:   . Lack of Transportation (Medical): Not on file  . Lack of Transportation (Non-Medical): Not on file  Physical Activity:   . Days of Exercise per Week: Not on file  . Minutes of Exercise per Session: Not on file  Stress:   . Feeling of Stress : Not on file  Social Connections:   . Frequency of Communication with Friends and Family: Not on file  . Frequency of Social Gatherings with Friends and Family: Not on file  . Attends Religious Services: Not on file  . Active Member of Clubs or Organizations: Not on file  . Attends Archivist Meetings: Not on file  . Marital Status: Not on file  Intimate Partner Violence:   . Fear of Current or Ex-Partner: Not on file  . Emotionally Abused: Not on file  . Physically Abused: Not on file  . Sexually Abused: Not on file    Allergies  Allergen Reactions  . Aspirin Nausea And Vomiting    Family History  Problem Relation Age of Onset  . Diabetes Mother   . Hypertension Mother   . Diabetes Father   . Heart disease Paternal Aunt   . Cancer Paternal Aunt   . Esophageal cancer Neg Hx   . Stomach cancer Neg Hx     Prior to Admission medications   Medication Sig Start Date End Date Taking? Authorizing Provider  albuterol (PROAIR HFA) 108 (90 Base) MCG/ACT inhaler Inhale 2 puffs into the lungs every 4 (four) hours as needed for wheezing or shortness of breath.    [provider]  albuterol (PROVENTIL) (2.5 MG/3ML) 0.083% nebulizer solution Take 2.5 mg by nebulization every 4 (four) hours as needed  for wheezing or shortness of breath.    [provider]  amLODipine (NORVASC) 10 MG tablet Take 1 tablet (10 mg total) by mouth daily. 08/31/19 05/27/20  Nita Sells, MD  amLODipine (NORVASC) 10 MG tablet Take 10 mg by mouth daily.    [provider]  ARIPiprazole (ABILIFY) 20 MG tablet Take 1 tablet (20 mg total) by mouth at bedtime. 03/04/15   Lance Bosch, NP  bisoprolol (ZEBETA) 5 MG  tablet One twice daily 01/23/20   Erlene Quan, PA-C  blood glucose meter kit and supplies KIT Dispense based on patient and insurance preference. Use up to four times daily as directed. (FOR ICD-9 250.00, 250.01). 08/31/19   Nita Sells, MD  Budeson-Glycopyrrol-Formoterol (BREZTRI AEROSPHERE) 160-9-4.8 MCG/ACT AERO Inhale 2 puffs into the lungs 2 (two) times daily. 09/18/19   Tanda Rockers, MD  chlorhexidine (PERIDEX) 0.12 % solution Use as directed 15 mLs in the mouth or throat 2 (two) times daily. 06/03/20   Wardell Honour, MD  diphenoxylate-atropine (LOMOTIL) 2.5-0.025 MG tablet Take 1 tablet by mouth 2 (two) times daily. 05/22/20   Willia Craze, NP  fluticasone (CUTIVATE) 0.05 % cream fluticasone propionate 0.05 % topical cream  APPLY EXTERNALLY TO THE AFFECTED AREA TWICE DAILY FOR 14 DAYS    [provider]  furosemide (LASIX) 80 MG tablet Take 38m in the morning and 487min the evening. Patient taking differently: 80 mg 2 (two) times daily. Take 8020mn the morning and 54m37m the evening. 09/26/19   KilrErlene Quan-C  gabapentin (NEURONTIN) 300 MG capsule Take 300 mg by mouth 3 (three) times daily.  02/02/19   [provider]  glimepiride (AMARYL) 1 MG tablet Take 1 tablet (1 mg total) by mouth daily with breakfast. 08/31/19   SamtNita Sells  glipiZIDE (GLUCOTROL XL) 10 MG 24 hr tablet glipizide ER 10 mg tablet, extended release 24 hr  TAKE 1 TABLET BY MOUTH EVERY DAY    [provider]  hydrOXYzine (VISTARIL) 25 MG capsule  hydroxyzine pamoate 25 mg capsule  TAKE 1 CAPSULE BY MOUTH EVERY NIGHT AT BEDTIME    [provider]  ipratropium (ATROVENT HFA) 17 MCG/ACT inhaler Atrovent HFA 17 mcg/actuation aerosol inhaler  INHALE 2 PUFFS INTO THE LUNGS EVERY 6 HOURS    [provider]  metFORMIN (GLUCOPHAGE) 1000 MG tablet metformin 1,000 mg tablet  TAKE 1 TABLET BY MOUTH TWICE DAILY    [provider]  montelukast (SINGULAIR) 10 MG tablet Take 1 tablet (10 mg total) by mouth at bedtime. 09/20/19   WalsMartyn Bean  omeprazole (PRILOSEC) 40 MG capsule Take 1 capsule (40 mg total) by mouth daily. 12/14/17   RandSkeet Latch  potassium chloride SA (K-DUR,KLOR-CON) 20 MEQ tablet Take 40 mEq by mouth 2 (two) times daily.     [provider]  PRESCRIPTION MEDICATION Inhale into the lungs at bedtime. CPAP    [provider]  Respiratory Therapy Supplies (FLUTTER) DEVI Use flutter device 3 times a day 07/20/18   WalsMartyn Bean  Triamcinolone Acetonide 0.025 % LOTN Apply 1 application topically 3 (three) times daily.    [provider]  triamterene-hydrochlorothiazide (MAXZIDE-25) 37.5-25 MG tablet triamterene 37.5 mg-hydrochlorothiazide 25 mg tablet  TAKE 1 TABLET BY MOUTH DAILY    [provider]  valsartan (DIOVAN) 80 MG tablet valsartan 80 mg tablet  TAKE 1 TABLET BY MOUTH EVERY DAY    [provider]    Physical Exam: Vitals:   06/28/20 0640 06/28/20 0645 06/28/20 0700 06/28/20 0730  BP:  125/79 128/71 133/72  Pulse: (!) 119 (!) 116 (!) 117 (!) 109  Resp: (!) 25 (!) 26 (!) 22 (!) 23  Temp:      TempSrc:      SpO2: 90% 94% 94% 93%     . General:  Appears calm and comfortable and is NAD . Eyes:  PERRL, EOMI, normal  lids, iris . ENT:  grossly normal hearing, lips & tongue, mmm; poor dentition . Neck:  no LAD, masses or thyromegaly . Cardiovascular:  RRR, no m/r/g.  LE edema present but hard to distinguish chorinicity due to  habitus.  [name] Respiratory:   CTA bilaterally with no wheezes/rales/rhonchi.  Normal respiratory effort at rest but significant exertional dyspnea despite 3L [place] O2. . Abdomen:  soft, NT, ND, NABS . Back:   normal alignment, no CVAT . Skin:  no rash or induration seen on limited exam . Musculoskeletal:  grossly normal tone BUE/BLE, good ROM, no bony abnormality . Psychiatric:  grossly normal mood and affect, speech fluent and appropriate, AOx3 . Neurologic:  CN 2-12 grossly intact, moves all extremities in coordinated fashion    Radiological Exams on Admission: DG Chest 2 View  Result Date: 06/28/2020 CLINICAL DATA:  Dyspnea EXAM: CHEST - 2 VIEW COMPARISON:  05/13/2020 FINDINGS: Mild right basilar atelectasis is present. Lungs are otherwise clear. No pneumothorax or pleural effusion. Cardiac size within normal limits. Pulmonary vascularity is normal. No acute bone abnormality. IMPRESSION: No active cardiopulmonary disease. Electronically Signed   By: Helyn Numbers MD   On: 06/28/2020 02:56    EKG: Independently reviewed.  NSR with rate 90; no evidence of acute ischemia   Labs on Admission: I have personally reviewed the available labs and imaging studies at the time of the admission.  Pertinent labs:   Glucose 129 BNP 32.9 HS troponin 7, 6 WBC 13.6 COVID/flu negative   Assessment/Plan Principal Problem:   COPD with acute exacerbation (HCC) Active Problems:   Essential hypertension   Bipolar affective disorder (HCC)   Schizophrenia (HCC)   Cigarette smoker   Chronic diastolic heart failure (HCC)   OSA (obstructive sleep apnea)   Morbid obesity due to excess calories (HCC)   Diabetes mellitus type 2 in obese (HCC)   Acute on chronic respiratory failure associated with a COPD exacerbation -Patient's shortness of breath and productive cough are most likely caused by acute COPD exacerbation.  -She has history of concomitant asthma and sees pulmonology -She ran out of her  inhaler and her nebulizer machine is broken -She does not have fever and has mild leukocytosis.  -Chest x-ray is not consistent with pneumonia -She was given a continuous neb treatment in the ED with some improvement.   -will observe for now and attempt to wean off [place] O2 if possible -Nebulizers: scheduled Duoneb and prn albuterol -Solu-Medrol 80 mg IV BID -> Prednisone 40 mg PO daily; 5 days of treatment is thought to be sufficient for treatment of acute COPD exacerbations -IV Azithromycin (has 3/3 cardinal symptoms)  -Continue Breztri, Singulair -Coordinated care with [facility] team/PT/OT/Nutrition/RT consults  Chronic diastolic CHF -Resolved on most recent echo in 04/2018 -She does have LE edema but this may be associated with habitus -Normal BNP -No evidence of pulmonary edema on CXR -Appears to be compensated at this time -Continue home dose of Lasix  OSA on CPAP -Home machine is broken -TOC team consult for assistance -Resume CPAP while here  HTN -Continue Norvasc, Bisoprolol, Maxzide, Diovan  DM -Will check A1c -hold Glucophage, Amaryl, Glucotrol -Cover with moderate-scale SSI  Chronic pain -I have reviewed this patient in the [place] Controlled Substances Reporting System.  She is receiving medications from multiple providers but generally appears to be taking them as prescribed. -She is at particularly high risk of opioid misuse or diversion but not for overdose. -Continue Oxycodone, Neurontin, Robaxin for now -Close outpatient  monitoring is encouraged  Bipolar/schizophrenia -Continue Abilify, Vistaril  Tobacco dependence -Encourage cessation.   -This was discussed with the patient and should be reviewed on an ongoing basis.   -Patch ordered at patient request.  Morbid obesity -BMI 48.98 -Weight loss should be encouraged -Outpatient PCP/bariatric medicine/bariatric surgery f/u encouraged    Note: This patient has been tested and is negative for the novel coronavirus  COVID-19. She has been fully vaccinated against COVID-19.    DVT prophylaxis: Lovenox  Code Status:  Full - confirmed with patient/family Family Communication: Sister was present for most of the evaluation Disposition Plan:  The patient is from: home  Anticipated d/c is to: home without [facility] services once her respiratory issues have been resolved.  Anticipated d/c date will depend on clinical response to treatment, but possibly as early as tomorrow if she has excellent response to treatment  Patient is currently: acutely ill Consults called: TOC team/PT/OT/Nutrition/RT  Admission status: It is my clinical opinion that referral for OBSERVATION is reasonable and necessary in this patient based on the above information provided. The aforementioned taken together are felt to place the patient at high risk for further clinical deterioration. However it is anticipated that the patient may be medically stable for discharge from the hospital within 24 to 48 hours.     Karmen Bongo MD [facility]   How to contact the [facility] Attending or Consulting provider [place] or covering provider during after hours [place], for this patient?  1. Check the care team in [facility] and look for a) attending/consulting TRH provider listed and b) the [facility] team listed 2. Log into [email or web address] and use [place]'s universal password to access. If you do not have the password, please contact the hospital operator. 3. Locate the [facility] provider you are looking for under [facility] and page to a number that you can be directly reached. 4. If you still have difficulty reaching the provider, please page the [facility] (Director on Call) for the Hospitalists listed on amion for assistance.   06/28/2020, 8:46 AM

## 2020-06-28 NOTE — Progress Notes (Signed)
Nutrition Brief Note  RD consulted for assessment of nutritional status/ needs secondary to morbid obesity.   Wt Readings from Last 15 Encounters:  06/28/20 121.8 kg  06/21/20 121.5 kg  05/31/20 120.7 kg  05/22/20 120.7 kg  05/13/20 119.3 kg  02/26/20 125.5 kg  09/19/19 127 kg  09/18/19 127.4 kg  09/01/19 125.6 kg  08/30/19 125.7 kg  08/16/19 125.6 kg  05/01/19 124.3 kg  02/14/19 122 kg  02/13/19 122.1 kg  12/08/18 118.8 kg   Lori Bean is a 49 y.o. female with medical history significant of schizophrenia/bipolar; morbid obesity (BMI 48.98); HTN; DM; OSA on CPAP; chronic diastolic CHF; and COPD with ongoing tobacco use presenting with COPD exacerbation.  Pt admitted with COPD exacerbation.   RD attempted to speak with pt via call to hospital room phone, however, unable to reach.   Medications reviewed and include colace and IV solu-medrol.   Lab Results  Component Value Date   HGBA1C 8.7 (H) 08/29/2019   PTA DM medications are 10 mg glipizide daily, 1000 mg metformin daily, .   Labs reviewed: CBGS: 253 (inpatient orders for glycemic control are 0-15 units insulin aspart TID with meals and 0-5 units insulin aspart daily at bedtime).   Body mass index is 49.13 kg/m. Patient meets criteria for extreme obesity, class III based on current BMI. Obesity is a complex, chronic medical condition that is optimally managed by a multidisciplinary care team. Weight loss is not an ideal goal for an acute inpatient hospitalization. However, if further work-up for obesity is warranted, consider outpatient referral to outpatient bariatric service and/or [facility].   Current diet order is carb modified, patient is consuming approximately n/a% of meals at this time. Labs and medications reviewed.   No nutrition interventions warranted at this time. If nutrition issues arise, please consult RD.   Lori Bean, RD, LDN, [place] Registered  Dietitian II Certified Diabetes Care and Education Specialist Please refer to [facility] for RD and/or RD on-call/weekend/after hours pager

## 2020-06-29 DIAGNOSIS — M1990 Unspecified osteoarthritis, unspecified site: Secondary | ICD-10-CM | POA: Diagnosis present

## 2020-06-29 DIAGNOSIS — F209 Schizophrenia, unspecified: Secondary | ICD-10-CM | POA: Diagnosis present

## 2020-06-29 DIAGNOSIS — K219 Gastro-esophageal reflux disease without esophagitis: Secondary | ICD-10-CM | POA: Diagnosis present

## 2020-06-29 DIAGNOSIS — Z7951 Long term (current) use of inhaled steroids: Secondary | ICD-10-CM | POA: Diagnosis not present

## 2020-06-29 DIAGNOSIS — Z6841 Body Mass Index (BMI) 40.0 and over, adult: Secondary | ICD-10-CM | POA: Diagnosis not present

## 2020-06-29 DIAGNOSIS — Z886 Allergy status to analgesic agent status: Secondary | ICD-10-CM | POA: Diagnosis not present

## 2020-06-29 DIAGNOSIS — E1140 Type 2 diabetes mellitus with diabetic neuropathy, unspecified: Secondary | ICD-10-CM | POA: Diagnosis present

## 2020-06-29 DIAGNOSIS — J45901 Unspecified asthma with (acute) exacerbation: Secondary | ICD-10-CM | POA: Diagnosis present

## 2020-06-29 DIAGNOSIS — Z809 Family history of malignant neoplasm, unspecified: Secondary | ICD-10-CM | POA: Diagnosis not present

## 2020-06-29 DIAGNOSIS — F319 Bipolar disorder, unspecified: Secondary | ICD-10-CM | POA: Diagnosis present

## 2020-06-29 DIAGNOSIS — J9621 Acute and chronic respiratory failure with hypoxia: Secondary | ICD-10-CM | POA: Diagnosis present

## 2020-06-29 DIAGNOSIS — Z7984 Long term (current) use of oral hypoglycemic drugs: Secondary | ICD-10-CM | POA: Diagnosis not present

## 2020-06-29 DIAGNOSIS — I5032 Chronic diastolic (congestive) heart failure: Secondary | ICD-10-CM | POA: Diagnosis present

## 2020-06-29 DIAGNOSIS — Z20822 Contact with and (suspected) exposure to covid-19: Secondary | ICD-10-CM | POA: Diagnosis present

## 2020-06-29 DIAGNOSIS — J441 Chronic obstructive pulmonary disease with (acute) exacerbation: Secondary | ICD-10-CM | POA: Diagnosis present

## 2020-06-29 DIAGNOSIS — G8929 Other chronic pain: Secondary | ICD-10-CM | POA: Diagnosis present

## 2020-06-29 DIAGNOSIS — F17210 Nicotine dependence, cigarettes, uncomplicated: Secondary | ICD-10-CM | POA: Diagnosis present

## 2020-06-29 DIAGNOSIS — Z833 Family history of diabetes mellitus: Secondary | ICD-10-CM | POA: Diagnosis not present

## 2020-06-29 DIAGNOSIS — Z79899 Other long term (current) drug therapy: Secondary | ICD-10-CM | POA: Diagnosis not present

## 2020-06-29 DIAGNOSIS — Z8249 Family history of ischemic heart disease and other diseases of the circulatory system: Secondary | ICD-10-CM | POA: Diagnosis not present

## 2020-06-29 DIAGNOSIS — I110 Hypertensive heart disease with heart failure: Secondary | ICD-10-CM | POA: Diagnosis present

## 2020-06-29 DIAGNOSIS — G4733 Obstructive sleep apnea (adult) (pediatric): Secondary | ICD-10-CM | POA: Diagnosis present

## 2020-06-29 LAB — HEMOGLOBIN A1C
Hgb A1c MFr Bld: 6.4 % — ABNORMAL HIGH (ref 4.8–5.6)
Mean Plasma Glucose: 137 mg/dL

## 2020-06-29 LAB — CBC
HCT: 39.5 % (ref 36.0–46.0)
Hemoglobin: 12.9 g/dL (ref 12.0–15.0)
MCH: 30.6 pg (ref 26.0–34.0)
MCHC: 32.7 g/dL (ref 30.0–36.0)
MCV: 93.8 fL (ref 80.0–100.0)
Platelets: 281 10*3/uL (ref 150–400)
RBC: 4.21 MIL/uL (ref 3.87–5.11)
RDW: 14.8 % (ref 11.5–15.5)
WBC: 17.9 10*3/uL — ABNORMAL HIGH (ref 4.0–10.5)
nRBC: 0.1 % (ref 0.0–0.2)

## 2020-06-29 LAB — BASIC METABOLIC PANEL
Anion gap: 13 (ref 5–15)
BUN: 22 mg/dL — ABNORMAL HIGH (ref 6–20)
CO2: 26 mmol/L (ref 22–32)
Calcium: 9.5 mg/dL (ref 8.9–10.3)
Chloride: 96 mmol/L — ABNORMAL LOW (ref 98–111)
Creatinine, Ser: 0.88 mg/dL (ref 0.44–1.00)
GFR, Estimated: >60 mL/min (ref >60)
Glucose, Bld: 332 mg/dL — ABNORMAL HIGH (ref 70–99)
Potassium: 4.7 mmol/L (ref 3.5–5.1)
Sodium: 135 mmol/L (ref 135–145)

## 2020-06-29 LAB — GLUCOSE, CAPILLARY
Glucose-Capillary: 234 mg/dL — ABNORMAL HIGH (ref 70–99)
Glucose-Capillary: 173 mg/dL — ABNORMAL HIGH (ref 70–99)
Glucose-Capillary: 270 mg/dL — ABNORMAL HIGH (ref 70–99)
Glucose-Capillary: 293 mg/dL — ABNORMAL HIGH (ref 70–99)

## 2020-06-29 MED ORDER — ENOXAPARIN SODIUM 60 MG/0.6ML ~~LOC~~ SOLN
60.0000 mg | SUBCUTANEOUS | Status: DC
  Administered 2020-06-30: 60 mg via SUBCUTANEOUS
  Filled 2020-06-29: qty 0.6

## 2020-06-29 NOTE — Progress Notes (Signed)
PROGRESS NOTE    Lori Bean  UKG:[id number] DOB: [birth year] DOA: 06/28/2020 PCP: [name], PA  Outpatient Specialists:   Brief Narrative:  Patient is a 49 year old African-American female, morbidly obese, with past medical history significant for schizophrenia/bipolar; HTN; DM; OSA on CPAP; chronic diastolic CHF; and COPD with ongoing tobacco use.  Patient was admitted with COPD with exacerbation.   Assessment & Plan:   Principal Problem:   COPD with acute exacerbation ([place]) Active Problems:   Essential hypertension   Bipolar affective disorder ([place])   Schizophrenia ([place])   Cigarette smoker   Chronic diastolic heart failure ([facility])   OSA (obstructive sleep apnea)   Morbid obesity due to excess calories ([facility])   Diabetes mellitus type 2 in obese ([facility])   Acute on chronic respiratory failure associated with a COPD exacerbation -Patient's shortness of breath and productive cough are most likely caused by acute COPD exacerbation.  -She has history of concomitant asthma and sees pulmonology -She ran out of her inhaler and her nebulizer machine is broken -Continue nebulizer treatment. -Continue prednisone. -Patient is slowly improving. -Possible discharge tomorrow. -Counseled to quit cigarette use  Chronic diastolic CHF -Stable.  OSA on CPAP -Home machine is broken -TOC team consult for assistance -Resume CPAP   HTN -Continue Norvasc, Bisoprolol, Maxzide, Diovan  DM -Hemoglobin A1c was 6.4%.   -Continue to optimize blood sugar control.    Chronic pain: -Continue Oxycodone, Neurontin, Robaxin for now -Close outpatient monitoring is encouraged  Bipolar/schizophrenia -Continue Abilify  Tobacco dependence -Counseled to quit cigarette use.  E  Morbid obesity -BMI 48.98 -Diet and exercise. -Outpatient PCP/bariatric medicine/bariatric surgery f/u encouraged  DVT prophylaxis: Subacute Lovenox Code Status: Full code Family Communication:   Disposition Plan: Home eventually   Consultants:   None  Procedures:   None  Antimicrobials:   None   Subjective: Shortness of breath and wheezing.  Objective: Vitals:   06/29/20 0440 06/29/20 0747 06/29/20 0939 06/29/20 1120  BP: 127/77 128/72  124/74  Pulse: 73 67  71  Resp:  18  18  Temp: 98 F (36.7 C) 98.3 F (36.8 C)  (!) 97.5 F (36.4 C)  TempSrc: Oral Oral  Oral  SpO2: 95% 100% 99% 99%  Weight:      Height:        Intake/Output Summary (Last 24 hours) at 06/29/2020 1312 Last data filed at 06/28/2020 2154 Gross per 24 hour  Intake 240 ml  Output 1000 ml  Net -760 ml   Filed Weights   06/28/20 1550 06/29/20 0145  Weight: 121.8 kg 122.2 kg    Examination:  General exam: Appears calm and comfortable.  Patient is morbidly obese. Respiratory system: Decreased air entry globally, with some expiratory wheeze. Cardiovascular system: S1 & S2  Gastrointestinal system: Abdomen is morbidly obese, soft and nontender.  Organs are difficult to assess.   Central nervous system: Alert and oriented. No focal neurological deficits. Extremities: Symmetric 5 x 5 power.  Data Reviewed: I have personally reviewed following labs and imaging studies  CBC: Recent Labs  Lab 06/28/20 0220 06/29/20 0224  WBC 13.6* 17.9*  NEUTROABS 9.2*  --   HGB 12.5 12.9  HCT 39.3 39.5  MCV 95.6 93.8  PLT 265 762   Basic Metabolic Panel: Recent Labs  Lab 06/28/20 0220 06/29/20 0224  NA 137 135  K 4.4 4.7  CL 101 96*  CO2 25 26  GLUCOSE 129* 332*  BUN 19 22*  CREATININE 0.71 0.88  CALCIUM  9.7 9.5   GFR: Estimated Creatinine Clearance: 95.3 mL/min (by C-G formula based on SCr of 0.88 mg/dL). Liver Function Tests: Recent Labs  Lab 06/28/20 0220  AST 16  ALT 36  ALKPHOS 69  BILITOT 0.6  PROT 6.8  ALBUMIN 3.6   No results for input(s): LIPASE, AMYLASE in the last 168 hours. No results for input(s): AMMONIA in the last 168 hours. Coagulation Profile: No  results for input(s): INR, PROTIME in the last 168 hours. Cardiac Enzymes: No results for input(s): CKTOTAL, CKMB, CKMBINDEX, TROPONINI in the last 168 hours. BNP (last 3 results) Recent Labs    08/16/19 0929 06/21/20 1004  PROBNP 38 16.0   HbA1C: Recent Labs    06/28/20 1127  HGBA1C 6.4*   CBG: Recent Labs  Lab 06/28/20 1200 06/28/20 1713 06/28/20 2216 06/29/20 0705 06/29/20 1115  GLUCAP 253* 260* 336* 234* 173*   Lipid Profile: No results for input(s): CHOL, HDL, LDLCALC, TRIG, CHOLHDL, LDLDIRECT in the last 72 hours. Thyroid Function Tests: No results for input(s): TSH, T4TOTAL, FREET4, T3FREE, THYROIDAB in the last 72 hours. Anemia Panel: No results for input(s): VITAMINB12, FOLATE, FERRITIN, TIBC, IRON, RETICCTPCT in the last 72 hours. Urine analysis:    Component Value Date/Time   COLORURINE YELLOW 08/28/2019 1626   APPEARANCEUR HAZY (A) 08/28/2019 1626   LABSPEC 1.033 (H) 08/28/2019 1626   PHURINE 5.0 08/28/2019 1626   GLUCOSEU 50 (A) 08/28/2019 1626   HGBUR NEGATIVE 08/28/2019 1626   BILIRUBINUR NEGATIVE 08/28/2019 1626   BILIRUBINUR neg 06/19/2015 1142   KETONESUR NEGATIVE 08/28/2019 1626   PROTEINUR NEGATIVE 08/28/2019 1626   UROBILINOGEN 0.2 06/19/2015 1142   UROBILINOGEN 0.2 08/24/2013 1416   NITRITE NEGATIVE 08/28/2019 1626   LEUKOCYTESUR NEGATIVE 08/28/2019 1626   Sepsis Labs: @LABRCNTIP (procalcitonin:4,lacticidven:4)  ) Recent Results (from the past 240 hour(s))  Respiratory Panel by RT PCR (Flu A&B, Covid) - Nasopharyngeal Swab     Status: None   Collection Time: 06/28/20  2:58 AM   Specimen: Nasopharyngeal Swab  Result Value Ref Range Status   SARS Coronavirus 2 by RT PCR NEGATIVE NEGATIVE Final    Comment: (NOTE) SARS-CoV-2 target nucleic acids are NOT DETECTED.  The SARS-CoV-2 RNA is generally detectable in upper respiratoy specimens during the acute phase of infection. The lowest concentration of SARS-CoV-2 viral copies this assay  can detect is 131 copies/mL. A negative result does not preclude SARS-Cov-2 infection and should not be used as the sole basis for treatment or other patient management decisions. A negative result may occur with  improper specimen collection/handling, submission of specimen other than nasopharyngeal swab, presence of viral mutation(s) within the areas targeted by this assay, and inadequate number of viral copies (<131 copies/mL). A negative result must be combined with clinical observations, patient history, and epidemiological information. The expected result is Negative.  Fact Sheet for Patients:  PinkCheek.be  Fact Sheet for Healthcare Providers:  GravelBags.it  This test is no t yet approved or cleared by the [place] FDA and  has been authorized for detection and/or diagnosis of SARS-CoV-2 by FDA under an Emergency Use Authorization (EUA). This EUA will remain  in effect (meaning this test can be used) for the duration of the COVID-19 declaration under Section 564(b)(1) of the Act, 21 U.S.C. section 360bbb-3(b)(1), unless the authorization is terminated or revoked sooner.     Influenza A by PCR NEGATIVE NEGATIVE Final   Influenza B by PCR NEGATIVE NEGATIVE Final    Comment: (NOTE) The Xpert Xpress  SARS-CoV-2/FLU/RSV assay is intended as an aid in  the diagnosis of influenza from Nasopharyngeal swab specimens and  should not be used as a sole basis for treatment. Nasal washings and  aspirates are unacceptable for Xpert Xpress SARS-CoV-2/FLU/RSV  testing.  Fact Sheet for Patients: PinkCheek.be  Fact Sheet for Healthcare Providers: GravelBags.it  This test is not yet approved or cleared by the [place] FDA and  has been authorized for detection and/or diagnosis of SARS-CoV-2 by  FDA under an Emergency Use Authorization (EUA). This EUA will remain   in effect (meaning this test can be used) for the duration of the  Covid-19 declaration under Section 564(b)(1) of the Act, 21  U.S.C. section 360bbb-3(b)(1), unless the authorization is  terminated or revoked. Performed at [facility], [place] [address], [place],  [address]          Radiology Studies: DG Chest 2 View  Result Date: 06/28/2020 CLINICAL DATA:  Dyspnea EXAM: CHEST - 2 VIEW COMPARISON:  05/13/2020 FINDINGS: Mild right basilar atelectasis is present. Lungs are otherwise clear. No pneumothorax or pleural effusion. Cardiac size within normal limits. Pulmonary vascularity is normal. No acute bone abnormality. IMPRESSION: No active cardiopulmonary disease. Electronically Signed   By: Fidela Salisbury MD   On: 06/28/2020 02:56        Scheduled Meds: . amLODipine  10 mg Oral Daily  . ARIPiprazole  20 mg Oral QHS  . azithromycin  500 mg Oral Daily  . bisoprolol  5 mg Oral BID  . docusate sodium  100 mg Oral BID  . enoxaparin (LOVENOX) injection  40 mg Subcutaneous Q24H  . fluticasone furoate-vilanterol  1 puff Inhalation Daily   And  . umeclidinium bromide  1 puff Inhalation Daily  . furosemide  80 mg Oral BID  . gabapentin  300 mg Oral TID  . hydrOXYzine  25 mg Oral QHS  . insulin aspart  0-15 Units Subcutaneous TID WC  . insulin aspart  0-5 Units Subcutaneous QHS  . ipratropium-albuterol  3 mL Nebulization Q6H  . irbesartan  75 mg Oral Daily  . montelukast  10 mg Oral QHS  . nicotine  14 mg Transdermal Daily  . pantoprazole  40 mg Oral Daily  . potassium chloride SA  40 mEq Oral BID  . predniSONE  40 mg Oral Q breakfast  . sodium chloride flush  3 mL Intravenous Q12H  . triamterene-hydrochlorothiazide  1 tablet Oral Daily   Continuous Infusions:   LOS: 0 days    Time spent: 25 minutes    Dana Allan, MD  [facility] Pager #: [phone] 7PM-7AM contact night coverage as above

## 2020-06-29 NOTE — Progress Notes (Signed)
{  OT AOT Cancellation Note  Patient Details Name: Lori Bean MRN: [id number] DOB: Feb 12, 1971   Cancelled Treatment:    Reason Eval/Treat Not Completed: OT screened, no needs identified, will sign off  Metta Clines 06/29/2020, 12:47 PM  06/29/2020  Denice Paradise, OTR/L  Acute Rehabilitation Services  Office:  [phone]

## 2020-06-29 NOTE — Evaluation (Signed)
Physical Therapy Evaluation and Discharge Patient Details Name: Lori Bean MRN: [id number] DOB: 10/03/1970 Today's Date: 06/29/2020   History of Present Illness  Lori Bean is a 49 y.o. female with medical history significant of schizophrenia/bipolar; morbid obesity (BMI 48.98); HTN; DM; OSA on CPAP; chronic diastolic CHF; and COPD with ongoing tobacco use presenting with COPD exacerbation.  Clinical Impression  Patient evaluated by Physical Therapy with no further acute PT needs identified. Ambulating safely without assistive device. Moderate DOE with SpO2 95-97% on room air. Patient reports feeling back to baseline. Lives with boyfriend and has an aide for IADLs.  All education has been completed and the patient has no further questions.  See below for any follow-up Physical Therapy or equipment needs. PT is signing off. Thank you for this referral.     Follow Up Recommendations No PT follow up    Equipment Recommendations  None recommended by PT    Recommendations for Other Services       Precautions / Restrictions Precautions Precautions: None (monitor O2) Restrictions Weight Bearing Restrictions: No      Mobility  Bed Mobility Overal bed mobility: Independent                Transfers Overall transfer level: Independent Equipment used: None Transfers: Sit to/from Stand Sit to Stand: Independent         General transfer comment: No assist needed. good power up and stability  Ambulation/Gait Ambulation/Gait assistance: Modified independent (Device/Increase time) Gait Distance (Feet): 200 Feet Assistive device: None Gait Pattern/deviations: Wide base of support;Decreased stride length Gait velocity: Decreased Gait velocity interpretation: 1.31 - 2.62 ft/sec, indicative of limited community ambulator General Gait Details: Wide BOS, no overt instability noted, decreased speed. Moderate DOE  Tree [facility] (Stroke Patients Only)       Balance Overall balance assessment: No apparent balance deficits (not formally assessed)                                           Pertinent Vitals/Pain Pain Assessment: No/denies pain    Home Living Family/patient expects to be discharged to:: Private residence Living Arrangements: Spouse/significant other (Boyfriend Holiday representative) Available Help at Discharge: Available 24 hours/day Type of Home: House Home Access: Level entry     Home Layout: One level Home Equipment: Clinical cytogeneticist - 2 wheels;Cane - single point      Prior Function Level of Independence: Independent with assistive device(s);Needs assistance   Gait / Transfers Assistance Needed: Kasandra Knudsen in and out of home primarily.   ADL's / Homemaking Assistance Needed: HH Aide assists with dishes and cleaning the home        Hand Dominance   Dominant Hand: Right    Extremity/Trunk Assessment   Upper Extremity Assessment Upper Extremity Assessment: Defer to OT evaluation    Lower Extremity Assessment Lower Extremity Assessment: RLE deficits/detail;LLE deficits/detail RLE Deficits / Details: Edematous LLE Deficits / Details: Edematous       Communication   Communication: No difficulties  Cognition Arousal/Alertness: Awake/alert Behavior During Therapy: WFL for tasks assessed/performed Overall Cognitive Status: Within Functional Limits for tasks assessed  General Comments General comments (skin integrity, edema, etc.): SpO2 97% at rest on RA, and 95% ambulating on RA, with moderate DOE.    Exercises     Assessment/Plan    PT Assessment Patent does not need any further PT services  PT Problem List         PT Treatment Interventions      PT Goals (Current goals can be found in the Care Plan section)  Acute Rehab PT Goals Patient Stated Goal: Go home today PT Goal Formulation: All  assessment and education complete, DC therapy    Frequency     Barriers to discharge        Co-evaluation               AM-PAC PT "6 Clicks" Mobility  Outcome Measure Help needed turning from your back to your side while in a flat bed without using bedrails?: None Help needed moving from lying on your back to sitting on the side of a flat bed without using bedrails?: None Help needed moving to and from a bed to a chair (including a wheelchair)?: None Help needed standing up from a chair using your arms (e.g., wheelchair or bedside chair)?: None Help needed to walk in hospital room?: None Help needed climbing 3-5 steps with a railing? : None 6 Click Score: 24    End of Session   Activity Tolerance: Patient tolerated treatment well Patient left: in chair;with call bell/phone within reach Nurse Communication: Mobility status PT Visit Diagnosis: Other abnormalities of gait and mobility (R26.89)    Time: 8099-8338 PT Time Calculation (min) (ACUTE ONLY): 20 min   Charges:   PT Evaluation $PT Eval Low Complexity: 1 Low          Elayne Snare, PT, DPT  Ellouise Newer 06/29/2020, 9:43 AM

## 2020-06-30 DIAGNOSIS — J441 Chronic obstructive pulmonary disease with (acute) exacerbation: Secondary | ICD-10-CM | POA: Diagnosis not present

## 2020-06-30 LAB — GLUCOSE, CAPILLARY
Glucose-Capillary: 289 mg/dL — ABNORMAL HIGH (ref 70–99)
Glucose-Capillary: 165 mg/dL — ABNORMAL HIGH (ref 70–99)

## 2020-06-30 MED ORDER — TORSEMIDE 20 MG PO TABS: 40.0000 mg | ORAL_TABLET | Freq: Two times a day (BID) | ORAL | 1 refills | Status: DC

## 2020-06-30 MED ORDER — DOCUSATE SODIUM 100 MG PO CAPS: 100.0000 mg | ORAL_CAPSULE | Freq: Two times a day (BID) | ORAL | 0 refills | Status: AC

## 2020-06-30 MED ORDER — PREDNISONE 10 MG PO TABS: ORAL_TABLET | ORAL | 0 refills | Status: DC

## 2020-06-30 MED ORDER — AZITHROMYCIN 500 MG PO TABS: 500.0000 mg | ORAL_TABLET | Freq: Every day | ORAL | 0 refills | Status: AC

## 2020-06-30 MED ORDER — NICOTINE 14 MG/24HR TD PT24: 14.0000 mg | MEDICATED_PATCH | Freq: Every day | TRANSDERMAL | 0 refills | Status: AC

## 2020-06-30 NOTE — Discharge Summary (Signed)
Physician Discharge Summary  Patient ID: Lori Bean MRN: [id number] DOB/AGE: [birth year] 50 y.o.  Admit date: 06/28/2020 Discharge date: 06/30/2020  Admission Diagnoses:  Discharge Diagnoses:  Principal Problem:   COPD with acute exacerbation ([place]) Active Problems:   Essential hypertension   Bipolar affective disorder ([place])   Schizophrenia ([place])   Cigarette smoker   Chronic diastolic heart failure ([facility])   OSA (obstructive sleep apnea)   Morbid obesity due to excess calories ([facility])   Diabetes mellitus type 2 in obese ([place])   COPD with exacerbation ([place])   Discharged Condition: stable  Hospital Course:  Patient is a 49 year old African-American female, morbidly obese, with past medical history significant for schizophrenia/bipolar; hypertension, diabetes mellitus, OSA on CPAP; chronic diastolic congestive heart failure, andCOPD with ongoing tobacco use.  Patient was admitted with COPD with exacerbation. Patient was managed with steroids and nebulizer treatment.  Patient was also managed with azithromycin.  Patient's symptoms are resolved.  Patient is back to baseline.  Patient is eager to be discharged back home today.  Patient will follow with a primary care provider and pulmonary team on discharge.  Patient has been encouraged to discuss option of bariatric surgery with the primary care provider.  Acute on chronic respiratory failure associated with a COPD exacerbation -Please see above documentation. -She has history ofconcomitant asthma and sees pulmonologist -She ran out of her inhaler and her nebulizer machine is broken -Patient was managed with steroids, nebulizer treatment and azithromycin. -Counseled to quit cigarette use  Chronic diastolic CHF -Stable.  OSA on CPAP -Home machine is broken -TOC team consult for assistance -Resume CPAP   HTN -Continue Norvasc, Bisoprolol, Maxzide, Diovan  DM -Hemoglobin A1c was 6.4%.   -Continue to optimize blood  sugar control.    Chronic pain: -Continue Oxycodone, Neurontin, Robaxin for now -Close outpatient monitoring is encouraged  Bipolar/schizophrenia -Continue Abilify  Tobacco dependence -Counseled to quit cigarette use.  E  Morbid obesity -UEK80.03 -Diet and exercise. -Outpatient PCP/bariatric medicine/bariatric surgery f/u encouraged  Consults: None  Discharge Exam: Blood pressure (!) 141/88, pulse 72, temperature 97.6 F (36.4 C), temperature source Oral, resp. rate 20, height $RemoveBe'5\' 2"'fgFYcnvjD$  (1.575 m), weight 122.2 kg, last menstrual period 10/15/2018, SpO2 96 %.  Disposition: Discharge disposition: 01-Home or Self Care   Discharge Instructions    Diet - low sodium heart healthy   Complete by: As directed    Diet Carb Modified   Complete by: As directed    Increase activity slowly   Complete by: As directed      Allergies as of 06/30/2020      Reactions   Aspirin Nausea And Vomiting      Medication List    STOP taking these medications   diphenoxylate-atropine 2.5-0.025 MG tablet Commonly known as: Lomotil   furosemide 80 MG tablet Commonly known as: LASIX     TAKE these medications   amLODipine 10 MG tablet Commonly known as: NORVASC Take 10 mg by mouth daily. What changed: Another medication with the same name was removed. Continue taking this medication, and follow the directions you see here.   ARIPiprazole 20 MG tablet Commonly known as: ABILIFY Take 1 tablet (20 mg total) by mouth at bedtime.   azithromycin 500 MG tablet Commonly known as: ZITHROMAX Take 1 tablet (500 mg total) by mouth daily for 3 days. Start taking on: July 01, 2020   bisoprolol 5 MG tablet Commonly known as: ZEBETA One twice daily What changed:   how much to  take  how to take this  when to take this   blood glucose meter kit and supplies Kit Dispense based on patient and insurance preference. Use up to four times daily as directed. (FOR ICD-9 250.00, 250.01).    Breztri Aerosphere 160-9-4.8 MCG/ACT Aero Generic drug: Budeson-Glycopyrrol-Formoterol Inhale 2 puffs into the lungs 2 (two) times daily.   docusate sodium 100 MG capsule Commonly known as: COLACE Take 1 capsule (100 mg total) by mouth 2 (two) times daily.   Flutter Devi Use flutter device 3 times a day   gabapentin 300 MG capsule Commonly known as: NEURONTIN Take 300 mg by mouth 3 (three) times daily.   glimepiride 1 MG tablet Commonly known as: AMARYL Take 1 tablet (1 mg total) by mouth daily with breakfast.   glipiZIDE 10 MG 24 hr tablet Commonly known as: GLUCOTROL XL Take 10 mg by mouth daily with breakfast.   hydrOXYzine 25 MG capsule Commonly known as: VISTARIL Take 25 mg by mouth at bedtime.   metFORMIN 1000 MG tablet Commonly known as: GLUCOPHAGE Take 1,000 mg by mouth 2 (two) times daily with a meal.   methocarbamol 500 MG tablet Commonly known as: ROBAXIN Take 500 mg by mouth every 6 (six) hours as needed for muscle spasms.   montelukast 10 MG tablet Commonly known as: SINGULAIR Take 1 tablet (10 mg total) by mouth at bedtime.   nicotine 14 mg/24hr patch Commonly known as: NICODERM CQ - dosed in mg/24 hours Place 1 patch (14 mg total) onto the skin daily for 14 days. Start taking on: July 01, 2020   omeprazole 40 MG capsule Commonly known as: PRILOSEC Take 1 capsule (40 mg total) by mouth daily.   Oxycodone HCl 10 MG Tabs Take 10 mg by mouth 4 (four) times daily as needed for pain.   potassium chloride SA 20 MEQ tablet Commonly known as: KLOR-CON Take 40 mEq by mouth 2 (two) times daily.   predniSONE 10 MG tablet Commonly known as: DELTASONE Prednisone 30 mg p.o. once daily for 4 days, then 20 Mg p.o. once daily for 4 days, then 10 Mg p.o. once daily for 4 days.   PRESCRIPTION MEDICATION Inhale into the lungs at bedtime. CPAP   ProAir HFA 108 (90 Base) MCG/ACT inhaler Generic drug: albuterol Inhale 2 puffs into the lungs every 4 (four)  hours as needed for wheezing or shortness of breath.   albuterol (2.5 MG/3ML) 0.083% nebulizer solution Commonly known as: PROVENTIL Take 2.5 mg by nebulization every 4 (four) hours as needed for wheezing or shortness of breath.   torsemide 20 MG tablet Commonly known as: Demadex Take 2 tablets (40 mg total) by mouth 2 (two) times daily.   Triamcinolone Acetonide 0.025 % Lotn Apply 1 application topically 3 (three) times daily as needed (to affected Areas).   triamterene-hydrochlorothiazide 37.5-25 MG tablet Commonly known as: MAXZIDE-25 Take 1 tablet by mouth daily.   valsartan 80 MG tablet Commonly known as: DIOVAN Take 80 mg by mouth daily.            Durable Medical Equipment  (From admission, onward)         Start     Ordered   06/30/20 1130  For home use only DME Nebulizer/meds  Once       Question Answer Comment  Patient needs a nebulizer to treat with the following condition Asthma   Length of Need Lifetime      06/30/20 1131  SignedBonnell Public 06/30/2020, 12:38 PM

## 2020-06-30 NOTE — Progress Notes (Signed)
IVs and teli removed. Paperwork given. Awaiting  on nebulizer.

## 2020-06-30 NOTE — Progress Notes (Signed)
Per pt she might be d/c today and states she will need help with CPAP, nebulizer and a scale when she goes home. Informed CM if we can help.

## 2020-06-30 NOTE — Progress Notes (Signed)
Patient discharged: Home with family  Via: Wheelchair   Discharge paperwork given: to patient and family  Reviewed with teach back  IV and telemetry disconnected  Belongings given to patient  Nebulizer arrived

## 2020-06-30 NOTE — TOC Transition Note (Signed)
Transition of Care [facility]) - CM/SW Discharge Note   Patient Details  Name: Lori Bean MRN: [id number] Date of Birth: 11/06/1970  Transition of Care [facility]) CM/SW Contact:  Claudie Leach, RN 06/30/2020, 11:33 AM   Clinical Narrative:    Patient states her CPAP broke 9 days ago and she is unable to sleep.  THe CPAP is from [employer].  She states she called [employer] and was told she can't get a new one for 1 year.  Andree Coss, [employer] rep., will f/u with patient this week to see if CPAP can be repaired.    Nebulizer ordered to be delivered to room from [facility].    Patient asking for scale.  There is no resource for a scale on the weekend. Patient is advised to f/u with her cardiologist for charity programs or obtain an inexpensive scale from a store ASAP.     Final next level of care: Home/Self Care      Discharge Plan and Services                DME Arranged: Nebulizer machine DME Agency: [employer] Date DME Agency Contacted: 06/30/20 Time DME Agency Contacted: [phone] Representative spoke with at DME Agency: Jeanella Anton

## 2020-07-04 NOTE — TOC Progression Note (Addendum)
Transition of Care [facility]) - Progression Note    Patient Details  Name: JOLANA RUNKLES MRN: [id number] Date of Birth: [birth year]  Transition of Care [facility]) CM/SW Contact  Zenon Mayo, RN Phone Number: 07/04/2020, 12:30 PM  Clinical Narrative:    NCM received call from patient from unit stating she was discharged on Sunday and she was suppose to have a cpap machine delivered to her but she has not heard back from anyone about it.  NCM contacted Shelia with [employer] to check into, Adela Lank states she will contact patient at [phone].  Per Adela Lank with [employer] the cpap supplies was sent out on 10/17 and it usually takes 5 to 7 business days to receive them so she may get the supplies tomorrow.  Adela Lank states she will call and give this information to the patient.       Expected Discharge Plan and Services           Expected Discharge Date: 06/30/20               DME Arranged: Nebulizer machine DME Agency: [employer] Date DME Agency Contacted: 06/30/20 Time DME Agency Contacted: [phone] Representative spoke with at DME Agency: Lucretia             Social Determinants of Health ([place]) Interventions    Readmission Risk Interventions No flowsheet data found.

## 2020-07-08 NOTE — TOC Progression Note (Addendum)
Transition of Care [facility]) - Progression Note    Patient Details  Name: Lori Bean MRN: [id number] Date of Birth: 10-Oct-1970  Transition of Care [facility]) CM/SW Contact  Zenon Mayo, RN Phone Number: 07/08/2020, 9:38 AM  Clinical Narrative:    Patient called the unit secretary back today stating she still has not gotten what she needs for cpap. NCM asked if anyone called her from [employer], she states no.  NCM contacted Zach with [employer] to reach out to patient , Thedore Mins states he will give her a call now.  Thedore Mins states that he contacted patient back and she just needed to get the settings for her bipap machine, so she is taken care of.        Expected Discharge Plan and Services           Expected Discharge Date: 06/30/20               DME Arranged: Nebulizer machine DME Agency: [employer] Date DME Agency Contacted: 06/30/20 Time DME Agency Contacted: [phone] Representative spoke with at DME Agency: Lucretia             Social Determinants of Health ([facility]) Interventions    Readmission Risk Interventions No flowsheet data found.

## 2020-07-16 NOTE — Progress Notes (Signed)
Cardiology Office Note  Date:  07/16/2020   ID:  DEYRA PERDOMO, DOB 1971-05-07, MRN [id number]  PCP:  [name], PA  Cardiologist:    _____________  Hospital follow-up  _____________   History of Present Illness: MAUDE HETTICH is a 49 y.o. female with pmh of morbid obesity, schizophrenia/bipolar, HTN, DM2, OSA on CPAP, chronic diastolic CHF, lymphedema, COPD and ongoing tobacco abuse, and tongue cancer. The patient was last seen 02/03/20 for telehealth visit and was doing well from a cardiac standpoint. Echo from August 2019 showed EF 60-65% with mild LVH.   The patient was hospitalized 10/15-10/17 for COPD exacerbation treated with antibiotics. BNP was 32. Lasix $RemoveB'80mg'lTKZaRsS$  BID was changed to Torsemide $RemoveBefo'40mg'DXJtUoeyMsE$  BID.  Today, she says breathing is overall better, however still has chronic shortness of breath on exertion. She does not feel this is worse. She denies chest pain. She uses a CPAP at night and always sleeps at an incline. Her lasix was changed to Torsemide during the admission however patient was only taking $RemoveBef'20mg'LVAEEeKxXC$  BID instead of the prescribed $RemoveBefore'40mg'aFNNdPDjeiLsN$  BID. According to her recorded weights it has gone up about 12-14lbs. At discharge she was 269lbs and today she is 282lbs. She reported her weight at the doctors office 2 days ago was 277lbs. She has chronic lymphedema and has not noticed significant change in lower leg swelling. On exam dos have mild pitting edema and she does feel abdomen is more firm. She does not have a scale at home. BP also high today and has taken medications this morning. She does not have a BP cuff at home and needs one. She denies symptoms of palpitations, chest pain, claudication, dizziness, presyncope, syncope, bleeding, or neurologic sequela.  _____________   Past Medical History:  Diagnosis Date  . Abdominal hernia   . Anginal pain ([place])    occ from asthma  . Anxiety   . Arthritis   . Asthma   . Bipolar affective disorder ([place])     Schizophrenia  . Bronchitis    hx of  . CHF (congestive heart failure) ([place])   . Depression   . Diabetes mellitus without complication ([place])    " New Onset " per patient  . GERD (gastroesophageal reflux disease)   . Heart murmur   . Hypertension    takes meds  . Lymphedema 06/07/2018  . Morbid (severe) obesity due to excess calories ([place])   . Neuropathy    left leg , "from back surgery"  . Overactive bladder   . Schizophrenia ([place])   . Sciatica   . Shortness of breath   . Sleep apnea   . Snoring disorder    Pt stated my boyfriend always wakes me up and tells me to breathe   Past Surgical History:  Procedure Laterality Date  . BACK SURGERY     3 back surgeries  . CHOLECYSTECTOMY    . COLONOSCOPY WITH PROPOFOL N/A 01/23/2016   Procedure: COLONOSCOPY WITH PROPOFOL;  Surgeon: Wonda Horner, MD;  Location: [facility];  Service: Endoscopy;  Laterality: N/A;  . EYE SURGERY     Metal plate in right eye. Had fracture in right eye  . gallstones reomved    . KNEE ARTHROSCOPY WITH MENISCAL REPAIR Right 09/28/2016   Procedure: KNEE ARTHROSCOPY WITH MENISCAL REPAIR;  Surgeon: Dorna Leitz, MD;  Location: [place];  Service: Orthopedics;  Laterality: Right;  Right partial meniscectomy and chondroplasty, patellar/femoral joint and medial femoral condyle   . LUMBAR LAMINECTOMY/DECOMPRESSION  MICRODISCECTOMY  04/01/2012   Procedure: LUMBAR LAMINECTOMY/DECOMPRESSION MICRODISCECTOMY 2 LEVELS;  Surgeon: Faythe Ghee, MD;  Location: [place] [facility];  Service: Neurosurgery;  Laterality: Left;  Lumbar four-five, lumbar five sacral one microdiscectomy   . LUMBAR WOUND DEBRIDEMENT  04/29/2012   Procedure: LUMBAR WOUND DEBRIDEMENT;  Surgeon: Faythe Ghee, MD;  Location: [place] [facility];  Service: Neurosurgery;  Laterality: N/A;  lumbar wound debridement  . ROTATOR CUFF REPAIR     Right shoulder  . TUBAL LIGATION     _____________  Current Outpatient Medications  Medication Sig Dispense Refill  .  albuterol (PROAIR HFA) 108 (90 Base) MCG/ACT inhaler Inhale 2 puffs into the lungs every 4 (four) hours as needed for wheezing or shortness of breath.    [name] albuterol (PROVENTIL) (2.5 MG/3ML) 0.083% nebulizer solution Take 2.5 mg by nebulization every 4 (four) hours as needed for wheezing or shortness of breath.    [name] amLODipine (NORVASC) 10 MG tablet Take 10 mg by mouth daily.    . ARIPiprazole (ABILIFY) 20 MG tablet Take 1 tablet (20 mg total) by mouth at bedtime. 30 tablet 0  . bisoprolol (ZEBETA) 5 MG tablet One twice daily (Patient taking differently: Take 5 mg by mouth in the morning and at bedtime. One twice daily) 60 tablet 3  . blood glucose meter kit and supplies KIT Dispense based on patient and insurance preference. Use up to four times daily as directed. (FOR ICD-9 250.00, 250.01). 1 each 0  . Budeson-Glycopyrrol-Formoterol (BREZTRI AEROSPHERE) 160-9-4.8 MCG/ACT AERO Inhale 2 puffs into the lungs 2 (two) times daily. 10.7 g 11  . docusate sodium (COLACE) 100 MG capsule Take 1 capsule (100 mg total) by mouth 2 (two) times daily. 10 capsule 0  . gabapentin (NEURONTIN) 300 MG capsule Take 300 mg by mouth 3 (three) times daily.     [name] glimepiride (AMARYL) 1 MG tablet Take 1 tablet (1 mg total) by mouth daily with breakfast. 30 tablet 3  . glipiZIDE (GLUCOTROL XL) 10 MG 24 hr tablet Take 10 mg by mouth daily with breakfast.     . hydrOXYzine (VISTARIL) 25 MG capsule Take 25 mg by mouth at bedtime.     . metFORMIN (GLUCOPHAGE) 1000 MG tablet Take 1,000 mg by mouth 2 (two) times daily with a meal.     . methocarbamol (ROBAXIN) 500 MG tablet Take 500 mg by mouth every 6 (six) hours as needed for muscle spasms.    . montelukast (SINGULAIR) 10 MG tablet Take 1 tablet (10 mg total) by mouth at bedtime. 30 tablet 11  . omeprazole (PRILOSEC) 40 MG capsule Take 1 capsule (40 mg total) by mouth daily. 90 capsule 0  . Oxycodone HCl 10 MG TABS Take 10 mg by mouth 4 (four) times daily as needed for pain.     . potassium chloride SA (K-DUR,KLOR-CON) 20 MEQ tablet Take 40 mEq by mouth 2 (two) times daily.     . predniSONE (DELTASONE) 10 MG tablet Prednisone 30 mg p.o. once daily for 4 days, then 20 Mg p.o. once daily for 4 days, then 10 Mg p.o. once daily for 4 days. 24 tablet 0  . PRESCRIPTION MEDICATION Inhale into the lungs at bedtime. CPAP    . Respiratory Therapy Supplies (FLUTTER) DEVI Use flutter device 3 times a day 1 each 0  . torsemide (DEMADEX) 20 MG tablet Take 2 tablets (40 mg total) by mouth 2 (two) times daily. 120 tablet 1  . Triamcinolone Acetonide 0.025 %  LOTN Apply 1 application topically 3 (three) times daily as needed (to affected Areas).     . triamterene-hydrochlorothiazide (MAXZIDE-25) 37.5-25 MG tablet Take 1 tablet by mouth daily.     . valsartan (DIOVAN) 80 MG tablet Take 80 mg by mouth daily.      No current facility-administered medications for this visit.   _____________   Allergies:   Aspirin  _____________   Social History:  The patient  reports that she has been smoking cigarettes. She has a 24.00 pack-year smoking history. She has never used smokeless tobacco. She reports that she does not drink alcohol and does not use drugs.  _____________   Family History:  The patient's family history includes Cancer in her paternal aunt; Diabetes in her father and mother; Heart disease in her paternal aunt; Hypertension in her mother.  _____________   ROS:  Please see the history of present illness.   Positive for dyspnea on exertion, lower leg swelling,   All other systems are reviewed and negative.  _____________   PHYSICAL EXAM: VS:  LMP 10/15/2018 Comment: patient had pregnancy test with PCP (test was negative) , BMI There is no height or weight on file to calculate BMI. GEN: Well nourished, well developed, in no acute distress  HEENT: normal  Neck: difficult to assess but appeared negative JVD, carotid bruits, or masses Cardiac: RRR; no murmurs, rubs, or gallops.  No clubbing, cyanosis, mild lower leg edema.  Radials/DP/PT 2+ and equal bilaterally.  Respiratory: Mildly diminished at basa, normal work of breathing GI: soft, nontender, nondistended, + BS MS: no deformity or atrophy  Skin: warm and dry, no rash Neuro:  Strength and sensation are intact Psych: euthymic mood, full affect _____________  EKG:   N/A  Recent Labs: 08/29/2019: TSH 0.331 08/30/2019: Magnesium 2.1 06/21/2020: Pro B Natriuretic peptide (BNP) 16.0 06/28/2020: ALT 36; B Natriuretic Peptide 32.9 06/29/2020: BUN 22; Creatinine, Ser 0.88; Hemoglobin 12.9; Platelets 281; Potassium 4.7; Sodium 135  08/28/2019: Triglycerides 163  CrCl cannot be calculated (Unknown ideal weight.).  Wt Readings from Last 3 Encounters:  06/29/20 269 lb 4.8 oz (122.2 kg)  06/21/20 267 lb 12.8 oz (121.5 kg)  05/31/20 266 lb (120.7 kg)     Relevant Studies:  Echo 04/2018  - Left ventricle: The cavity size was normal. Wall thickness was  increased in a pattern of mild LVH. Systolic function was normal.  The estimated ejection fraction was in the range of 60% to 65%.  Mildly reduced GLS at -16%. Wall motion was normal; there were no  regional wall motion abnormalities. Left ventricular diastolic  function parameters were normal.  - Mitral valve: Mildly thickened leaflets . There was trivial  regurgitation.  - Left atrium: The atrium was normal in size.  - Right atrium: The atrium was mildly dilated.  - Tricuspid valve: There was trivial regurgitation.  - Pulmonary arteries: PA peak pressure: 22 mm Hg (S).  - Inferior vena cava: The vessel was normal in size. The  respirophasic diameter changes were in the normal range (= 50%),  consistent with normal central venous pressure.   Impressions:   - LVEF 60-65%, mild LVH, normal wall motion, mildly abnormal GLS at  -16%, normal diastolic function, trivial MR, normal LA size, mild  RAE, trivial TR, RVSP 22 mmHg, normal IVC.   _____________   ASSESSMENT AND PLAN:  Chronic diastolic CHF Preserved EF on echo August 2019 with mild LVH. Lasix 80 mg BID changed to torsemide 40 mg BID at  discharge however patient is only taking 20 mg BID. Weight up about 12-14lbs according to serial weights at her doctors appointments. Mild lower leg edema but also has lymphedema. Increase Torsemide to originally prescribed 40mg  BID. Stop Maxzide. Will get patient a scale. Check BMET in 1 week and follow-up in 2 weeks.    Lymphedema Lasix changed to torsemide. Stop Maxzide as above and increase Torsemide to 40mg  BID.  HTN BP high today, 174/91.  Dhe did take her meds today. She does not have a BP cuff at home, will get one sent to her. Continue Bisoprolol and Valsartain. Stop Maxzide as above and increase Torsemide. Instructed patient to take daily Bps and bring this in at follow-up.   COPD with ongoing tobacco use Recent hospitalization for COPD exacerbation. Breathing improved since discharge however has chronic dyspnea on exertion.   Morbid Obesity Recommend weight loss  OSA On CPAP  Disposition:   FU with APP in 2 weeks  Signed, Nocholas Damaso Ninfa Meeker, PA-C 07/16/2020 8:41 PM    _____________ [facility] [address] [place] [address]  [phone] (office) [phone] (fax)

## 2020-07-17 ENCOUNTER — Telehealth: Payer: Self-pay | Admitting: Licensed Clinical Social Worker

## 2020-07-17 ENCOUNTER — Ambulatory Visit (INDEPENDENT_AMBULATORY_CARE_PROVIDER_SITE_OTHER): Payer: Medicaid Other | Admitting: Cardiology

## 2020-07-17 ENCOUNTER — Encounter: Payer: Self-pay | Admitting: Cardiology

## 2020-07-17 ENCOUNTER — Other Ambulatory Visit: Payer: Self-pay

## 2020-07-17 VITALS — BP 174/91 | HR 82 | Ht 62.0 in | Wt 282.0 lb

## 2020-07-17 DIAGNOSIS — Z79899 Other long term (current) drug therapy: Secondary | ICD-10-CM

## 2020-07-17 DIAGNOSIS — Z720 Tobacco use: Secondary | ICD-10-CM

## 2020-07-17 DIAGNOSIS — I5032 Chronic diastolic (congestive) heart failure: Secondary | ICD-10-CM | POA: Diagnosis not present

## 2020-07-17 DIAGNOSIS — I890 Lymphedema, not elsewhere classified: Secondary | ICD-10-CM

## 2020-07-17 DIAGNOSIS — I10 Essential (primary) hypertension: Secondary | ICD-10-CM

## 2020-07-17 DIAGNOSIS — J449 Chronic obstructive pulmonary disease, unspecified: Secondary | ICD-10-CM | POA: Diagnosis not present

## 2020-07-17 DIAGNOSIS — G4733 Obstructive sleep apnea (adult) (pediatric): Secondary | ICD-10-CM

## 2020-07-17 MED ORDER — TORSEMIDE 20 MG PO TABS
40.0000 mg | ORAL_TABLET | Freq: Two times a day (BID) | ORAL | 3 refills | Status: DC
Start: 2020-07-17 — End: 2021-04-03

## 2020-07-17 NOTE — Telephone Encounter (Signed)
CSW referred to assist patient with obtaining a BP cuff. CSW contacted patient to inform cuff will be delivered to home. Patient grateful for support and assistance. CSW available as needed. Jackie Anajah Sterbenz, LCSW, CCSW-MCS [phone]  

## 2020-07-17 NOTE — Patient Instructions (Signed)
Medication Instructions:  STOP- Maxzide INCREASE- Torsemide 40 mg by mouth twice a day  *If you need a refill on your cardiac medications before your next appointment, please call your pharmacy*   Lab Work: BMP in 1 Week  If you have labs (blood work) drawn today and your tests are completely normal, you will receive your results only by: [name] MyChart Message (if you have MyChart) OR . A paper copy in the mail If you have any lab test that is abnormal or we need to change your treatment, we will call you to review the results.   Testing/Procedures: None Ordered   Follow-Up: At [facility] ([place] you and your health needs are our priority.  As part of our continuing mission to provide you with exceptional heart care, we have created designated Provider Care Teams.  These Care Teams include your primary Cardiologist (physician) and Advanced Practice Providers (APPs -  Physician Assistants and Nurse Practitioners) who all work together to provide you with the care you need, when you need it.  We recommend signing up for the patient portal called "MyChart".  Sign up information is provided on this After Visit Summary.  MyChart is used to connect with patients for Virtual Visits (Telemedicine).  Patients are able to view lab/test results, encounter notes, upcoming appointments, etc.  Non-urgent messages can be sent to your provider as well.   To learn more about what you can do with MyChart, go to [email or web address].    Your next appointment:   2 week(s)  The format for your next appointment:   In Person  Provider:   APP

## 2020-07-23 LAB — BASIC METABOLIC PANEL
BUN/Creatinine Ratio: 19 (ref 9–23)
BUN: 14 mg/dL (ref 6–24)
CO2: 24 mmol/L (ref 20–29)
Calcium: 9.8 mg/dL (ref 8.7–10.2)
Chloride: 99 mmol/L (ref 96–106)
Creatinine, Ser: 0.72 mg/dL (ref 0.57–1.00)
GFR calc Af Amer: 113 mL/min/{1.73_m2} (ref >59)
GFR calc non Af Amer: 98 mL/min/{1.73_m2} (ref >59)
Glucose: 117 mg/dL — ABNORMAL HIGH (ref 65–99)
Potassium: 4.3 mmol/L (ref 3.5–5.2)
Sodium: 141 mmol/L (ref 134–144)

## 2020-08-01 NOTE — Progress Notes (Deleted)
Cardiology Clinic Note   Patient Name: Lori Bean Date of Encounter: 08/01/2020  Primary Care Provider:  [name], PA Primary Cardiologist:  [name], MD  Patient Profile    ***  Past Medical History    Past Medical History:  Diagnosis Date  . Abdominal hernia   . Anginal pain ([place])    occ from asthma  . Anxiety   . Arthritis   . Asthma   . Bipolar affective disorder ([place])    Schizophrenia  . Bronchitis    hx of  . CHF (congestive heart failure) ([place])   . Depression   . Diabetes mellitus without complication ([place])    " New Onset " per patient  . GERD (gastroesophageal reflux disease)   . Heart murmur   . Hypertension    takes meds  . Lymphedema 06/07/2018  . Morbid (severe) obesity due to excess calories ([place])   . Neuropathy    left leg , "from back surgery"  . Overactive bladder   . Schizophrenia ([place])   . Sciatica   . Shortness of breath   . Sleep apnea   . Snoring disorder    Pt stated my boyfriend always wakes me up and tells me to breathe   Past Surgical History:  Procedure Laterality Date  . BACK SURGERY     3 back surgeries  . CHOLECYSTECTOMY    . COLONOSCOPY WITH PROPOFOL N/A 01/23/2016   Procedure: COLONOSCOPY WITH PROPOFOL;  Surgeon: Wonda Horner, MD;  [facility];  Service: Endoscopy;  Laterality: N/A;  . EYE SURGERY     Metal plate in right eye. Had fracture in right eye  . gallstones reomved    . KNEE ARTHROSCOPY WITH MENISCAL REPAIR Right 09/28/2016   Procedure: KNEE ARTHROSCOPY WITH MENISCAL REPAIR;  Surgeon: Dorna Leitz, MD;  Location: [facility];  Service: Orthopedics;  Laterality: Right;  Right partial meniscectomy and chondroplasty, patellar/femoral joint and medial femoral condyle   . LUMBAR LAMINECTOMY/DECOMPRESSION MICRODISCECTOMY  04/01/2012   Procedure: LUMBAR LAMINECTOMY/DECOMPRESSION MICRODISCECTOMY 2 LEVELS;  Surgeon: Faythe Ghee, MD;  [facility];  Service: Neurosurgery;   Laterality: Left;  Lumbar four-five, lumbar five sacral one microdiscectomy   . LUMBAR WOUND DEBRIDEMENT  04/29/2012   Procedure: LUMBAR WOUND DEBRIDEMENT;  Surgeon: Faythe Ghee, MD;  Location: [facility];  Service: Neurosurgery;  Laterality: N/A;  lumbar wound debridement  . ROTATOR CUFF REPAIR     Right shoulder  . TUBAL LIGATION      Allergies  Allergies  Allergen Reactions  . Aspirin Nausea And Vomiting    History of Present Illness    ***  Home Medications    Prior to Admission medications   Medication Sig Start Date End Date Taking? Authorizing Provider  albuterol (PROAIR HFA) 108 (90 Base) MCG/ACT inhaler Inhale 2 puffs into the lungs every 4 (four) hours as needed for wheezing or shortness of breath.    [provider]  albuterol (PROVENTIL) (2.5 MG/3ML) 0.083% nebulizer solution Take 2.5 mg by nebulization every 4 (four) hours as needed for wheezing or shortness of breath.    [provider]  amLODipine (NORVASC) 10 MG tablet Take 10 mg by mouth daily.    [provider]  ARIPiprazole (ABILIFY) 20 MG tablet Take 1 tablet (20 mg total) by mouth at bedtime. 03/04/15   Lance Bosch, NP  bisoprolol (ZEBETA) 5 MG tablet One twice daily Patient taking differently: Take 5 mg by  mouth in the morning and at bedtime. One twice daily 01/23/20   Erlene Quan, PA-C  blood glucose meter kit and supplies KIT Dispense based on patient and insurance preference. Use up to four times daily as directed. (FOR ICD-9 250.00, 250.01). 08/31/19   Nita Sells, MD  Budeson-Glycopyrrol-Formoterol (BREZTRI AEROSPHERE) 160-9-4.8 MCG/ACT AERO Inhale 2 puffs into the lungs 2 (two) times daily. 09/18/19   Tanda Rockers, MD  gabapentin (NEURONTIN) 300 MG capsule Take 300 mg by mouth 3 (three) times daily.  02/02/19   [provider]  glipiZIDE (GLUCOTROL XL) 10 MG 24 hr tablet Take 10 mg by mouth daily with breakfast.     [provider]   hydrOXYzine (VISTARIL) 25 MG capsule Take 25 mg by mouth at bedtime.     [provider]  metFORMIN (GLUCOPHAGE) 1000 MG tablet Take 1,000 mg by mouth 2 (two) times daily with a meal.     [provider]  methocarbamol (ROBAXIN) 500 MG tablet Take 500 mg by mouth every 6 (six) hours as needed for muscle spasms. 05/30/20   [provider]  montelukast (SINGULAIR) 10 MG tablet Take 1 tablet (10 mg total) by mouth at bedtime. 09/20/19   Martyn Ehrich, NP  omeprazole (PRILOSEC) 40 MG capsule Take 1 capsule (40 mg total) by mouth daily. 12/14/17   [name], MD  Oxycodone HCl 10 MG TABS Take 10 mg by mouth 4 (four) times daily as needed for pain. 06/12/20   [provider]  potassium chloride SA (K-DUR,KLOR-CON) 20 MEQ tablet Take 40 mEq by mouth 2 (two) times daily.     [provider]  PRESCRIPTION MEDICATION Inhale into the lungs at bedtime. CPAP    [provider]  Respiratory Therapy Supplies (FLUTTER) DEVI Use flutter device 3 times a day 07/20/18   Martyn Ehrich, NP  torsemide (DEMADEX) 20 MG tablet Take 2 tablets (40 mg total) by mouth 2 (two) times daily. 07/17/20   Erlene Quan, PA-C  Triamcinolone Acetonide 0.025 % LOTN Apply 1 application topically 3 (three) times daily as needed (to affected Areas).     [provider]  valsartan (DIOVAN) 80 MG tablet Take 80 mg by mouth daily.     [provider]    Family History    Family History  Problem Relation Age of Onset  . Diabetes Mother   . Hypertension Mother   . Diabetes Father   . Heart disease Paternal Aunt   . Cancer Paternal Aunt   . Esophageal cancer Neg Hx   . Stomach cancer Neg Hx    She indicated that her mother is alive. She indicated that her father is alive. She indicated that her sister is alive. She indicated that her brother is alive. She indicated that the status of her paternal aunt is unknown. She indicated that the status of her neg  hx is unknown.  Social History    Social History   Socioeconomic History  . Marital status: Divorced    Spouse name: Not on file  . Number of children: 2  . Years of education: 15  . Highest education level: Not on file  Occupational History    Comment: disabled  Tobacco Use  . Smoking status: Current Every Day Smoker    Packs/day: 1.00    Years: 24.00    Pack years: 24.00    Types: Cigarettes  . Smokeless tobacco: Never Used  Vaping Use  . Vaping Use:  Every day  . Substances: Nicotine  Substance and Sexual Activity  . Alcohol use: No    Alcohol/week: 0.0 standard drinks    Comment: quit Nov. 2014  . Drug use: No  . Sexual activity: Yes    Birth control/protection: Surgical  Other Topics Concern  . Not on file  Social History Narrative   Lives alone   Caffeine - coffee 1 cup daily   Social Determinants of Health   Financial Resource Strain:   . Difficulty of Paying Living Expenses: Not on file  Food Insecurity:   . Worried About Charity fundraiser in the Last Year: Not on file  . Ran Out of Food in the Last Year: Not on file  Transportation Needs:   . Lack of Transportation (Medical): Not on file  . Lack of Transportation (Non-Medical): Not on file  Physical Activity:   . Days of Exercise per Week: Not on file  . Minutes of Exercise per Session: Not on file  Stress:   . Feeling of Stress : Not on file  Social Connections:   . Frequency of Communication with Friends and Family: Not on file  . Frequency of Social Gatherings with Friends and Family: Not on file  . Attends Religious Services: Not on file  . Active Member of Clubs or Organizations: Not on file  . Attends Archivist Meetings: Not on file  . Marital Status: Not on file  Intimate Partner Violence:   . Fear of Current or Ex-Partner: Not on file  . Emotionally Abused: Not on file  . Physically Abused: Not on file  . Sexually Abused: Not on file     Review of Systems    General:  No  chills, fever, night sweats or weight changes.  Cardiovascular:  No chest pain, dyspnea on exertion, edema, orthopnea, palpitations, paroxysmal nocturnal dyspnea. Dermatological: No rash, lesions/masses Respiratory: No cough, dyspnea Urologic: No hematuria, dysuria Abdominal:   No nausea, vomiting, diarrhea, bright red blood per rectum, melena, or hematemesis Neurologic:  No visual changes, wkns, changes in mental status. All other systems reviewed and are otherwise negative except as noted above.  Physical Exam    VS:  LMP 10/15/2018 Comment: patient had pregnancy test with PCP (test was negative) , BMI There is no height or weight on file to calculate BMI. GEN: Well nourished, well developed, in no acute distress. HEENT: normal. Neck: Supple, no JVD, carotid bruits, or masses. Cardiac: RRR, no murmurs, rubs, or gallops. No clubbing, cyanosis, edema.  Radials/DP/PT 2+ and equal bilaterally.  Respiratory:  Respirations regular and unlabored, clear to auscultation bilaterally. GI: Soft, nontender, nondistended, BS + x 4. MS: no deformity or atrophy. Skin: warm and dry, no rash. Neuro:  Strength and sensation are intact. Psych: Normal affect.  Accessory Clinical Findings    Recent Labs: 08/29/2019: TSH 0.331 08/30/2019: Magnesium 2.1 06/21/2020: Pro B Natriuretic peptide (BNP) 16.0 06/28/2020: ALT 36; B Natriuretic Peptide 32.9 06/29/2020: Hemoglobin 12.9; Platelets 281 07/23/2020: BUN 14; Creatinine, Ser 0.72; Potassium 4.3; Sodium 141   Recent Lipid Panel    Component Value Date/Time   CHOL 148 06/07/2018 1024   TRIG 163 (H) 08/28/2019 1100   HDL 51 06/07/2018 1024   CHOLHDL 2.9 06/07/2018 1024   CHOLHDL 2.7 05/25/2015 0333   VLDL 21 05/25/2015 0333   LDLCALC 76 06/07/2018 1024    ECG personally reviewed by me today- *** - No acute changes  Assessment & Plan   1.  ***  Jossie Ng. Zailyn Rowser NP-C    08/01/2020, 9:42 AM [address] Office [phone] Fax [phone]  Notice: This dictation was prepared with Dragon dictation along with smaller phrase technology. Any transcriptional errors that result from this process are unintentional and may not be corrected upon review.

## 2020-08-04 IMAGING — DX DG CHEST 2V
2 series · 2 of 2 positions shown · non-contrast
Comparison: 03/22/2018

CLINICAL DATA: Shortness of breath and congestion five days.

EXAM:
CHEST - 2 VIEW

[x chest ap]
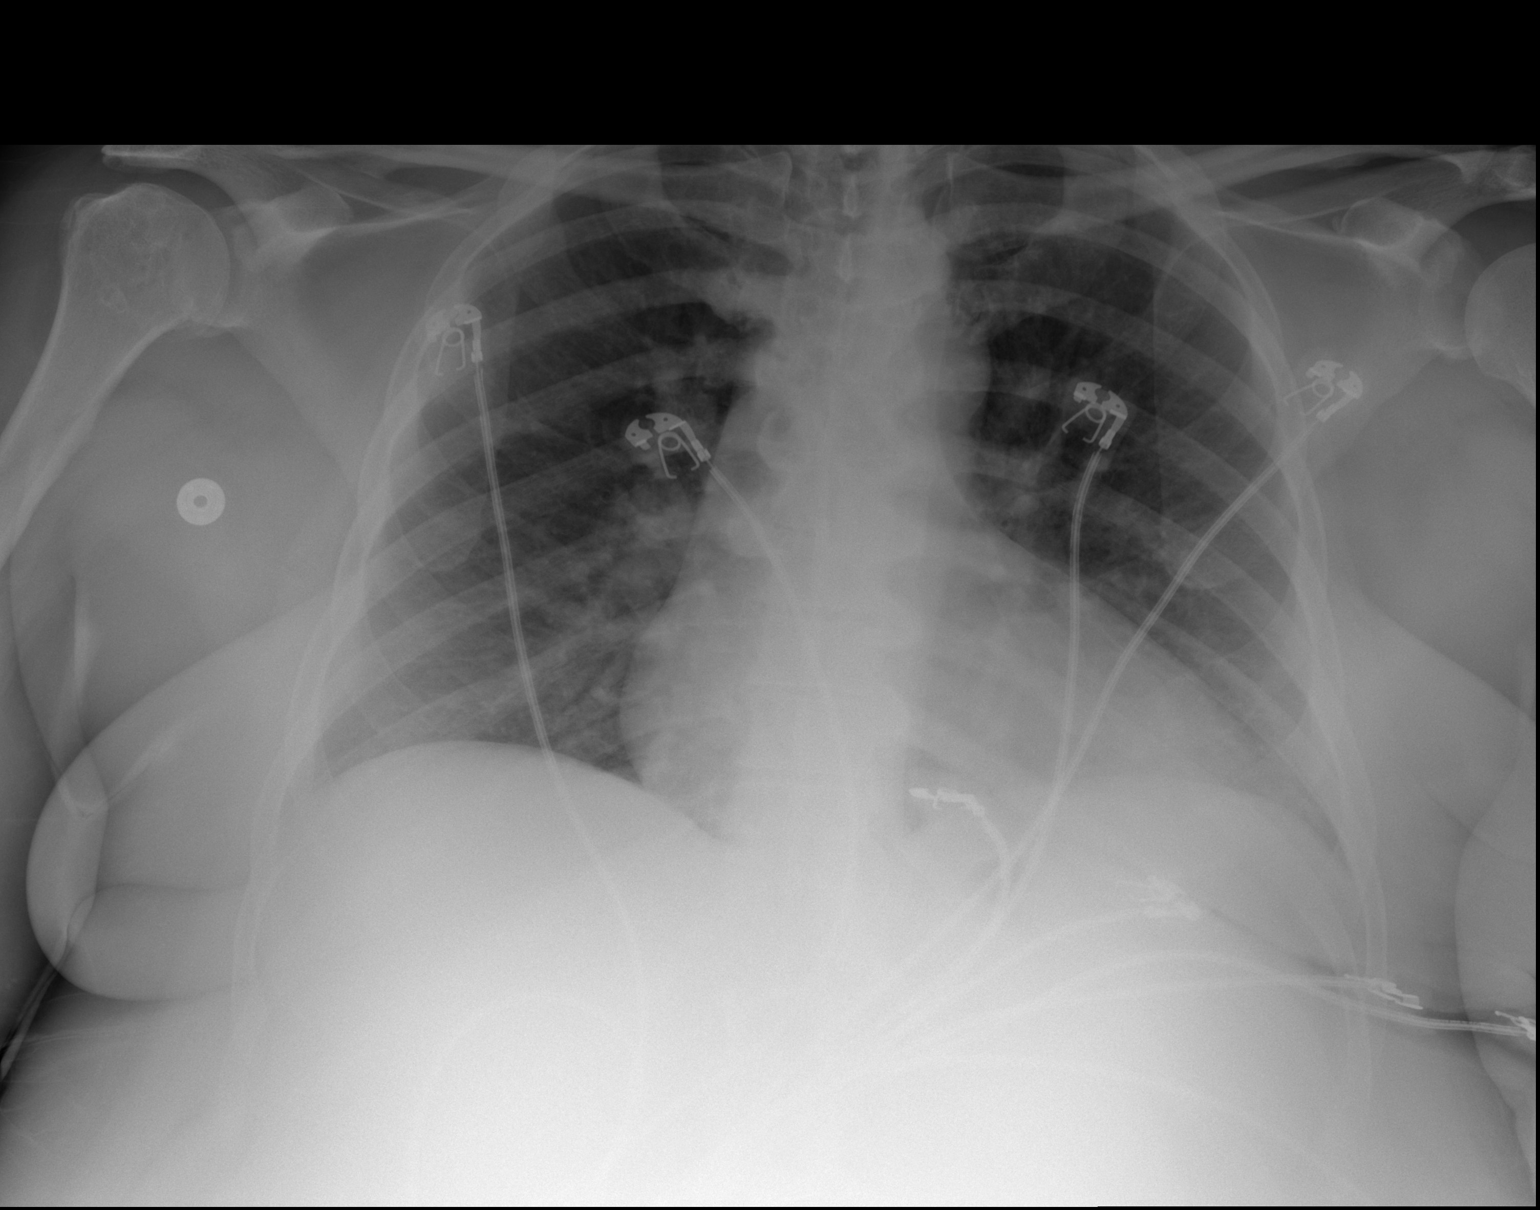

[w chest lat]
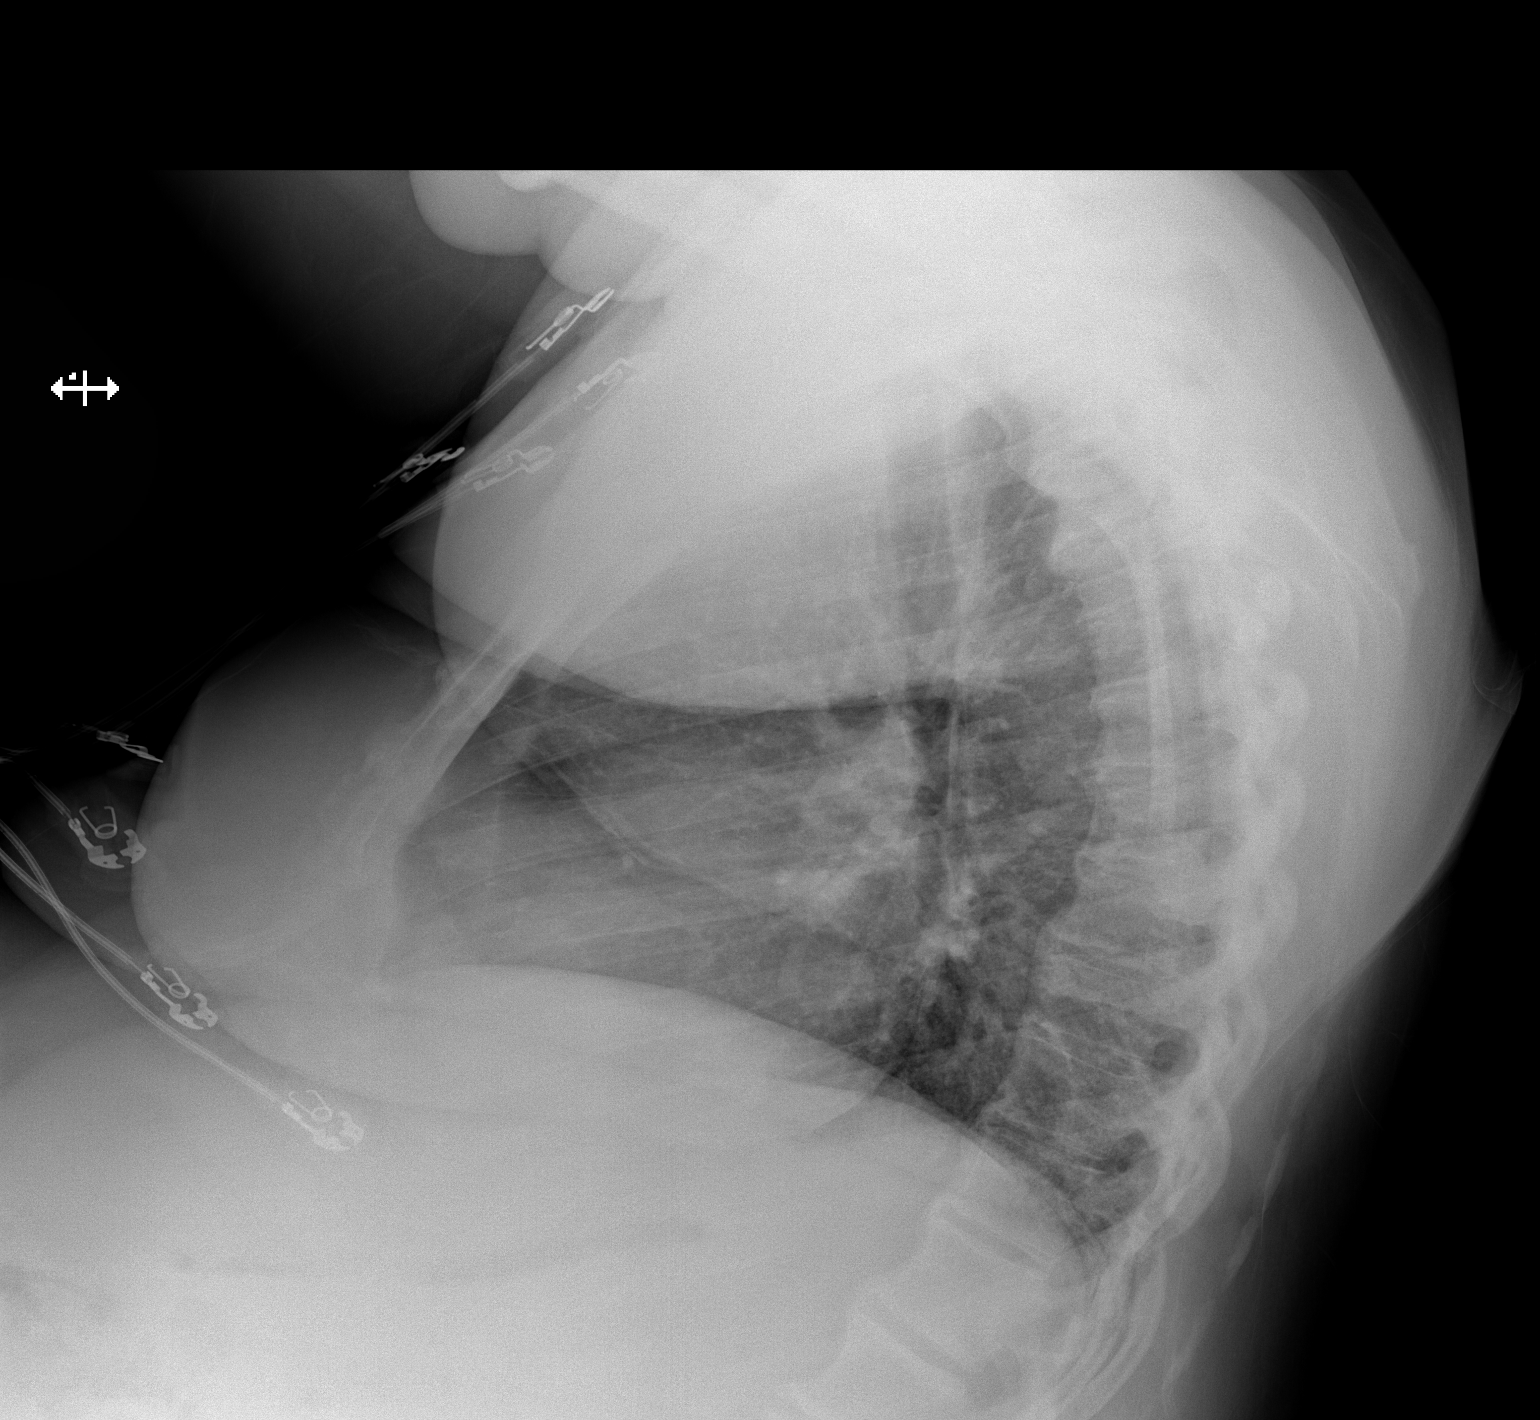

[2 of 2 positions shown; findings below may reference images not displayed]

FINDINGS: Lungs are somewhat hypoinflated without focal airspace consolidation
or effusion. Stable borderline cardiomegaly. Mild degenerative
change of the spine and shoulders. Prominent overlying soft tissues.
IMPRESSION: No acute cardiopulmonary disease.

## 2020-08-07 ENCOUNTER — Ambulatory Visit: Payer: Medicaid Other | Admitting: General Practice

## 2020-08-12 ENCOUNTER — Ambulatory Visit: Payer: Medicaid Other | Admitting: Occupational Therapy

## 2020-08-19 ENCOUNTER — Encounter: Payer: Medicaid Other | Admitting: Occupational Therapy

## 2020-08-26 ENCOUNTER — Encounter: Payer: Medicaid Other | Admitting: Occupational Therapy

## 2020-09-02 ENCOUNTER — Encounter: Payer: Medicaid Other | Admitting: Occupational Therapy

## 2020-09-16 NOTE — Progress Notes (Deleted)
Cardiology Clinic Note   Patient Name: Lori Bean Date of Encounter: 09/16/2020  Primary Care Provider:  [name], PA Primary Cardiologist:  [name], MD  Patient Profile    Lori Stabile. Bean 50 year old female presents the clinic for follow-up evaluation of her chronic diastolic CHF, lymphedema, and hypertension.  Past Medical History    Past Medical History:  Diagnosis Date  . Abdominal hernia   . Anginal pain ([place])    occ from asthma  . Anxiety   . Arthritis   . Asthma   . Bipolar affective disorder ([place])    Schizophrenia  . Bronchitis    hx of  . CHF (congestive heart failure) ([place])   . Depression   . Diabetes mellitus without complication ([place])    " New Onset " per patient  . GERD (gastroesophageal reflux disease)   . Heart murmur   . Hypertension    takes meds  . Lymphedema 06/07/2018  . Morbid (severe) obesity due to excess calories ([place])   . Neuropathy    left leg , "from back surgery"  . Overactive bladder   . Schizophrenia ([place])   . Sciatica   . Shortness of breath   . Sleep apnea   . Snoring disorder    Pt stated my boyfriend always wakes me up and tells me to breathe   Past Surgical History:  Procedure Laterality Date  . BACK SURGERY     3 back surgeries  . CHOLECYSTECTOMY    . COLONOSCOPY WITH PROPOFOL N/A 01/23/2016   Procedure: COLONOSCOPY WITH PROPOFOL;  Surgeon: Wonda Horner, MD;  [facility];  Service: Endoscopy;  Laterality: N/A;  . EYE SURGERY     Metal plate in right eye. Had fracture in right eye  . gallstones reomved    . KNEE ARTHROSCOPY WITH MENISCAL REPAIR Right 09/28/2016   Procedure: KNEE ARTHROSCOPY WITH MENISCAL REPAIR;  Surgeon: Dorna Leitz, MD;  Location: [facility];  Service: Orthopedics;  Laterality: Right;  Right partial meniscectomy and chondroplasty, patellar/femoral joint and medial femoral condyle   . LUMBAR LAMINECTOMY/DECOMPRESSION MICRODISCECTOMY  04/01/2012   Procedure: LUMBAR  LAMINECTOMY/DECOMPRESSION MICRODISCECTOMY 2 LEVELS;  Surgeon: Faythe Ghee, MD;  [facility];  Service: Neurosurgery;  Laterality: Left;  Lumbar four-five, lumbar five sacral one microdiscectomy   . LUMBAR WOUND DEBRIDEMENT  04/29/2012   Procedure: LUMBAR WOUND DEBRIDEMENT;  Surgeon: Faythe Ghee, MD;  Location: [facility];  Service: Neurosurgery;  Laterality: N/A;  lumbar wound debridement  . ROTATOR CUFF REPAIR     Right shoulder  . TUBAL LIGATION      Allergies  Allergies  Allergen Reactions  . Aspirin Nausea And Vomiting    History of Present Illness    [name] has a PMH of morbid obesity, schizophrenia/bipolar, hypertension, diabetes mellitus type 2, OSA on CPAP, chronic diastolic CHF, lipidemia, COPD, tobacco abuse, and tongue cancer. Echocardiogram 8/19 showed an EF of 60-65% with mild LVH.  She was hospitalized on 06/28/2020-06/30/2020 for COPD exacerbation. She was treated with antibiotics. BNP was 32. Her Lasix 80 mg twice daily was changed to torsemide 40 mg twice daily.  She was last seen by Dewaine Conger, PA-C on 07/17/2020. During that time she reported feeling somewhat better. She continued with her chronic shortness of breath. She did not feel that her breathing was worse at that time. She denied chest pain and reported compliance with her CPAP. She reported only taking 20 mg twice daily  of torsemide instead of 40 mg twice daily. Her weight at her prior discharge was 269 pounds. Her weight at today of her last clinic evaluation was 282 pounds. She reported that she had been weighed 2 days prior at her doctor's office and her weight was 277 pounds. She was noted to have chronic lymphedema and had not noticed any significant changes in her lower extremity swelling. On exam she had mild lower extremity pitting edema. She did feel that her abdomen was more firm. Her blood pressure was elevated at 174/91. She reported taking her medications prior to her visit.  She did not have a home scale or blood pressure cuff. Social work was consulted for assistance with both blood pressure cuff and scale. She denied palpitations, chest pain, claudication, dizziness, presyncope, syncope, bleeding, and neurological sequela.  She reports the clinic for follow-up evaluation states***  *** denies chest pain, shortness of breath, lower extremity edema, fatigue, palpitations, melena, hematuria, hemoptysis, diaphoresis, weakness, presyncope, syncope, orthopnea, and PND.   Home Medications    Prior to Admission medications   Medication Sig Start Date End Date Taking? Authorizing Provider  albuterol (PROAIR HFA) 108 (90 Base) MCG/ACT inhaler Inhale 2 puffs into the lungs every 4 (four) hours as needed for wheezing or shortness of breath.    [provider]  albuterol (PROVENTIL) (2.5 MG/3ML) 0.083% nebulizer solution Take 2.5 mg by nebulization every 4 (four) hours as needed for wheezing or shortness of breath.    [provider]  amLODipine (NORVASC) 10 MG tablet Take 10 mg by mouth daily.    [provider]  ARIPiprazole (ABILIFY) 20 MG tablet Take 1 tablet (20 mg total) by mouth at bedtime. 03/04/15   Keck, Valerie A, NP  bisoprolol (ZEBETA) 5 MG tablet One twice daily Patient taking differently: Take 5 mg by mouth in the morning and at bedtime. One twice daily 01/23/20   Kilroy, Luke K, PA-C  blood glucose meter kit and supplies KIT Dispense based on patient and insurance preference. Use up to four times daily as directed. (FOR ICD-9 250.00, 250.01). 08/31/19   Samtani, Jai-Gurmukh, MD  Budeson-Glycopyrrol-Formoterol (BREZTRI AEROSPHERE) 160-9-4.8 MCG/ACT AERO Inhale 2 puffs into the lungs 2 (two) times daily. 09/18/19   Wert, Michael B, MD  gabapentin (NEURONTIN) 300 MG capsule Take 300 mg by mouth 3 (three) times daily.  02/02/19   [provider]  glipiZIDE (GLUCOTROL XL) 10 MG 24 hr tablet Take 10 mg by mouth daily with breakfast.      [provider]  hydrOXYzine (VISTARIL) 25 MG capsule Take 25 mg by mouth at bedtime.     [provider]  metFORMIN (GLUCOPHAGE) 1000 MG tablet Take 1,000 mg by mouth 2 (two) times daily with a meal.     [provider]  methocarbamol (ROBAXIN) 500 MG tablet Take 500 mg by mouth every 6 (six) hours as needed for muscle spasms. 05/30/20   [provider]  montelukast (SINGULAIR) 10 MG tablet Take 1 tablet (10 mg total) by mouth at bedtime. 09/20/19   Walsh, Elizabeth W, NP  omeprazole (PRILOSEC) 40 MG capsule Take 1 capsule (40 mg total) by mouth daily. 12/14/17   [place], Tiffany, MD  Oxycodone HCl 10 MG TABS Take 10 mg by mouth 4 (four) times daily as needed for pain. 06/12/20   [provider]  potassium chloride SA (K-DUR,KLOR-CON) 20 MEQ tablet Take 40 mEq by mouth 2 (two) times daily.     [provider]    PRESCRIPTION MEDICATION Inhale into the lungs at bedtime. CPAP    [provider]  Respiratory Therapy Supplies (FLUTTER) DEVI Use flutter device 3 times a day 07/20/18   Martyn Ehrich, NP  torsemide (DEMADEX) 20 MG tablet Take 2 tablets (40 mg total) by mouth 2 (two) times daily. 07/17/20   Erlene Quan, PA-C  Triamcinolone Acetonide 0.025 % LOTN Apply 1 application topically 3 (three) times daily as needed (to affected Areas).     [provider]  valsartan (DIOVAN) 80 MG tablet Take 80 mg by mouth daily.     [provider]    Family History    Family History  Problem Relation Age of Onset  . Diabetes Mother   . Hypertension Mother   . Diabetes Father   . Heart disease Paternal Aunt   . Cancer Paternal Aunt   . Esophageal cancer Neg Hx   . Stomach cancer Neg Hx    She indicated that her mother is alive. She indicated that her father is alive. She indicated that her sister is alive. She indicated that her brother is alive. She indicated that the status of her paternal aunt is unknown. She indicated  that the status of her neg hx is unknown.  Social History    Social History   Socioeconomic History  . Marital status: Divorced    Spouse name: Not on file  . Number of children: 2  . Years of education: 55  . Highest education level: Not on file  Occupational History    Comment: disabled  Tobacco Use  . Smoking status: Current Every Day Smoker    Packs/day: 1.00    Years: 24.00    Pack years: 24.00    Types: Cigarettes  . Smokeless tobacco: Never Used  Vaping Use  . Vaping Use: Every day  . Substances: Nicotine  Substance and Sexual Activity  . Alcohol use: No    Alcohol/week: 0.0 standard drinks    Comment: quit Nov. 2014  . Drug use: No  . Sexual activity: Yes    Birth control/protection: Surgical  Other Topics Concern  . Not on file  Social History Narrative   Lives alone   Caffeine - coffee 1 cup daily   Social Determinants of Health   Financial Resource Strain: Not on file  Food Insecurity: Not on file  Transportation Needs: Not on file  Physical Activity: Not on file  Stress: Not on file  Social Connections: Not on file  Intimate Partner Violence: Not on file     Review of Systems    General:  No chills, fever, night sweats or weight changes.  Cardiovascular:  No chest pain, dyspnea on exertion, edema, orthopnea, palpitations, paroxysmal nocturnal dyspnea. Dermatological: No rash, lesions/masses Respiratory: No cough, dyspnea Urologic: No hematuria, dysuria Abdominal:   No nausea, vomiting, diarrhea, bright red blood per rectum, melena, or hematemesis Neurologic:  No visual changes, wkns, changes in mental status. All other systems reviewed and are otherwise negative except as noted above.  Physical Exam    VS:  LMP 10/15/2018 Comment: patient had pregnancy test with PCP (test was negative) , BMI There is no height or weight on file to calculate BMI. GEN: Well nourished, well developed, in no acute distress. HEENT: normal. Neck: Supple, no JVD,  carotid bruits, or masses. Cardiac: RRR, no murmurs, rubs, or gallops. No clubbing, cyanosis, edema.  Radials/DP/PT 2+ and equal bilaterally.  Respiratory:  Respirations regular and unlabored, clear to auscultation bilaterally.  GI: Soft, nontender, nondistended, BS + x 4. MS: no deformity or atrophy. Skin: warm and dry, no rash. Neuro:  Strength and sensation are intact. Psych: Normal affect.  Accessory Clinical Findings    Recent Labs: 06/21/2020: Pro B Natriuretic peptide (BNP) 16.0 06/28/2020: ALT 36; B Natriuretic Peptide 32.9 06/29/2020: Hemoglobin 12.9; Platelets 281 07/23/2020: BUN 14; Creatinine, Ser 0.72; Potassium 4.3; Sodium 141   Recent Lipid Panel    Component Value Date/Time   CHOL 148 06/07/2018 1024   TRIG 163 (H) 08/28/2019 1100   HDL 51 06/07/2018 1024   CHOLHDL 2.9 06/07/2018 1024   CHOLHDL 2.7 05/25/2015 0333   VLDL 21 05/25/2015 0333   LDLCALC 76 06/07/2018 1024    ECG personally reviewed by me today- *** - No acute changes  Echocardiogram 04/14/2018 Study Conclusions   - Left ventricle: The cavity size was normal. Wall thickness was  increased in a pattern of mild LVH. Systolic function was normal.  The estimated ejection fraction was in the range of 60% to 65%.  Mildly reduced GLS at -16%. Wall motion was normal; there were no  regional wall motion abnormalities. Left ventricular diastolic  function parameters were normal.  - Mitral valve: Mildly thickened leaflets . There was trivial  regurgitation.  - Left atrium: The atrium was normal in size.  - Right atrium: The atrium was mildly dilated.  - Tricuspid valve: There was trivial regurgitation.  - Pulmonary arteries: PA peak pressure: 22 mm Hg (S).  - Inferior vena cava: The vessel was normal in size. The  respirophasic diameter changes were in the normal range (= 50%),  consistent with normal central venous pressure.   Assessment & Plan   1. Chronic diastolic CHF-euvolemic  today. No increased DOE or activity intolerance. Echocardiogram 8/19 showed preserved EF and mild LV H. Previously misunderstood torsemide dosing instructions and was on taking 20 mg twice daily instead of 40 mg twice daily. Her weight has increased 12-14 pounds. Social work was contacted for help with the scale and blood pressure cuff. BMP stable Continue torsemide Heart healthy low-sodium diet-salty 6 given Increase physical activity as tolerated Daily weights  Lymphedema-remained stable. Continue torsemide Heart healthy low-sodium diet-salty 6 given Increase physical activity as tolerated Lower extremity support stockings  Essential hypertension-BP today***. Well-controlled at home. Continue valsartan, bisoprolol, torsemide Heart healthy low-sodium diet-salty 6 given Increase physical activity as tolerated Continue blood pressure monitoring  COPD-no increased work of breathing. Encourage smoking cessation Continue torsemide  Morbid obesity-weight today***. Reports daily weights. Continue daily weights Continue heart healthy low-sodium diet Increase physical activity as tolerated  Disposition: Follow-up with Dr. Oval Linsey in 3 months.      Jossie Ng. Keiera Strathman NP-C    09/16/2020, 2:57 PM [facility] [address] Office [phone] Fax [phone]  Notice: This dictation was prepared with Dragon dictation along with smaller phrase technology. Any transcriptional errors that result from this process are unintentional and may not be corrected upon review.

## 2020-09-17 ENCOUNTER — Ambulatory Visit: Payer: Medicaid Other | Admitting: General Practice

## 2020-10-15 ENCOUNTER — Ambulatory Visit
Admission: EM | Admit: 2020-10-15 | Discharge: 2020-10-15 | Disposition: A | Discharge status: Home / Self Care | Payer: Medicaid Other | Attending: Emergency Medicine | Admitting: Emergency Medicine

## 2020-10-15 ENCOUNTER — Ambulatory Visit (INDEPENDENT_AMBULATORY_CARE_PROVIDER_SITE_OTHER): Payer: Medicaid Other

## 2020-10-15 ENCOUNTER — Other Ambulatory Visit: Payer: Self-pay

## 2020-10-15 DIAGNOSIS — M542 Cervicalgia: Secondary | ICD-10-CM | POA: Diagnosis not present

## 2020-10-15 MED ORDER — TIZANIDINE HCL 4 MG PO TABS
2.0000 mg | ORAL_TABLET | Freq: Four times a day (QID) | ORAL | 0 refills | Status: DC | PRN
Start: 2020-10-15 — End: 2020-12-02

## 2020-10-15 MED ORDER — KETOROLAC TROMETHAMINE 30 MG/ML IJ SOLN
30.0000 mg | Freq: Once | INTRAMUSCULAR | Status: AC
  Administered 2020-10-15: 30 mg via INTRAMUSCULAR

## 2020-10-15 MED ORDER — PREDNISONE 10 MG PO TABS: ORAL_TABLET | ORAL | 0 refills | Status: DC

## 2020-10-15 NOTE — Discharge Instructions (Addendum)
X-ray showing some arthritis/degenerative changes in neck; please follow-up with sports medicine for further treatment and evaluation of neck pain We gave you a shot of Toradol Begin prednisone taper x6 days-please monitor sugars, drink plenty of water and adhere to diabetes medicine while on this Supplement tizanidine at home/bedtime, this is muscle relaxer, may cause drowsiness Gentle neck stretching-see attached Alternate ice and heat Follow-up if not improving or worsening

## 2020-10-15 NOTE — ED Triage Notes (Signed)
Pt c/o neck pain since July 2021. States now the pain is across neck radiating to rt upper chest. Pt c/o decrease movement d/t pain. States taken oxycodone through pain management with no relief. Pt denies known injury.

## 2020-10-15 NOTE — ED Provider Notes (Signed)
[facility]    CSN: [id number] Arrival date & time: 10/15/20  0830      History   Chief Complaint Chief Complaint  Patient presents with  . Neck Pain    HPI Lori Bean is a 50 y.o. female presenting today for evaluation of neck pain. Started July 2021 after helping assist a bath and felt a pulling sensation. Persistent for 6 months, worsened in past 1-2 weeks with increased stiffness. Started on left side and now goes into middle and right side. Occasional pain radiates into hands with some paresthesias. Taking oxycodone through pain magaement and gabapentin.   HPI  Past Medical History:  Diagnosis Date  . Abdominal hernia   . Anginal pain ([place])    occ from asthma  . Anxiety   . Arthritis   . Asthma   . Bipolar affective disorder ([place])    Schizophrenia  . Bronchitis    hx of  . CHF (congestive heart failure) ([place])   . Depression   . Diabetes mellitus without complication ([place])    " New Onset " per patient  . GERD (gastroesophageal reflux disease)   . Heart murmur   . Hypertension    takes meds  . Lymphedema 06/07/2018  . Morbid (severe) obesity due to excess calories ([place])   . Neuropathy    left leg , "from back surgery"  . Overactive bladder   . Schizophrenia ([place])   . Sciatica   . Shortness of breath   . Sleep apnea   . Snoring disorder    Pt stated my boyfriend always wakes me up and tells me to breathe    Patient Active Problem List   Diagnosis Date Noted  . COPD with exacerbation ([place]) 06/29/2020  . Diabetes mellitus type 2 in obese ([place]) 06/28/2020  . Acute on chronic respiratory failure ([place]) 09/01/2019  . COPD with acute exacerbation ([place]) 08/28/2019  . COPD exacerbation ([place]) 08/28/2019  . Acute bronchitis 08/16/2019  . Tongue cancer ([place]) 02/14/2019  . 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 Sinusitis 07/22/2018  . Lymphedema 06/07/2018  . Abnormal uterine bleeding (AUB) 12/12/2017  . CHF (congestive heart failure) ([place])    . Chronic asthma, mild persistent, uncomplicated 42/59/5638  . Dyspnea on exertion 05/05/2017  . Asthma exacerbation in COPD ([place]) 05/05/2017  . Morbid obesity due to excess calories ([place]) 05/05/2017  . 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 Synovial plica of right knee 75/64/3329  . Tear of medial meniscus of right knee, initial encounter 09/28/2016  . OSA (obstructive sleep apnea) 09/23/2016  . Chronic diastolic heart failure ([place]) 04/22/2016  . Cigarette smoker 04/03/2014  . Chest pain 04/03/2014  . Lumbar spondylosis 12/28/2013  . Extrinsic asthma 06/07/2013  . Anxiety   . Essential hypertension   . Bipolar affective disorder ([place])   . GERD (gastroesophageal reflux disease)   . Arthritis   . Schizophrenia ([place])   . Overactive bladder     Past Surgical History:  Procedure Laterality Date  . BACK SURGERY     3 back surgeries  . CHOLECYSTECTOMY    . COLONOSCOPY WITH PROPOFOL N/A 01/23/2016   Procedure: COLONOSCOPY WITH PROPOFOL;  Surgeon: Wonda Horner, MD;  Location: [facility];  Service: Endoscopy;  Laterality: N/A;  . EYE SURGERY     Metal plate in right eye. Had fracture in right eye  . gallstones reomved    . KNEE ARTHROSCOPY WITH MENISCAL REPAIR Right 09/28/2016   Procedure: KNEE ARTHROSCOPY WITH  MENISCAL REPAIR;  Surgeon: Dorna Leitz, MD;  Location: [facility];  Service: Orthopedics;  Laterality: Right;  Right partial meniscectomy and chondroplasty, patellar/femoral joint and medial femoral condyle   . LUMBAR LAMINECTOMY/DECOMPRESSION MICRODISCECTOMY  04/01/2012   Procedure: LUMBAR LAMINECTOMY/DECOMPRESSION MICRODISCECTOMY 2 LEVELS;  Surgeon: Faythe Ghee, MD;  Location: [facility];  Service: Neurosurgery;  Laterality: Left;  Lumbar four-five, lumbar five sacral one microdiscectomy   . LUMBAR WOUND DEBRIDEMENT  04/29/2012   Procedure: LUMBAR WOUND DEBRIDEMENT;  Surgeon: Faythe Ghee, MD;  Location: [facility];  Service: Neurosurgery;  Laterality: N/A;  lumbar  wound debridement  . ROTATOR CUFF REPAIR     Right shoulder  . TUBAL LIGATION      OB History    Gravida  4   Para  2   Term  0   Preterm  2   AB  2   Living  2     SAB  1   IAB  1   Ectopic      Multiple      Live Births  2            Home Medications    Prior to Admission medications   Medication Sig Start Date End Date Taking? Authorizing Provider  predniSONE (DELTASONE) 10 MG tablet Begin with 6 tabs on day 1, 5 tab on day 2, 4 tab on day 3, 3 tab on day 4, 2 tab on day 5, 1 tab on day 6-take with food 10/15/20  Yes Kiaraliz Rafuse C, PA-C  tiZANidine (ZANAFLEX) 4 MG tablet Take 0.5-1 tablets (2-4 mg total) by mouth every 6 (six) hours as needed for muscle spasms. 10/15/20  Yes Kellin Bartling C, PA-C  albuterol (PROAIR HFA) 108 (90 Base) MCG/ACT inhaler Inhale 2 puffs into the lungs every 4 (four) hours as needed for wheezing or shortness of breath.    [provider]  albuterol (PROVENTIL) (2.5 MG/3ML) 0.083% nebulizer solution Take 2.5 mg by nebulization every 4 (four) hours as needed for wheezing or shortness of breath.    [provider]  amLODipine (NORVASC) 10 MG tablet Take 10 mg by mouth daily.    [provider]  ARIPiprazole (ABILIFY) 20 MG tablet Take 1 tablet (20 mg total) by mouth at bedtime. 03/04/15   Lance Bosch, NP  bisoprolol (ZEBETA) 5 MG tablet One twice daily Patient taking differently: Take 5 mg by mouth in the morning and at bedtime. One twice daily 01/23/20   Erlene Quan, PA-C  blood glucose meter kit and supplies KIT Dispense based on patient and insurance preference. Use up to four times daily as directed. (FOR ICD-9 250.00, 250.01). 08/31/19   Nita Sells, MD  Budeson-Glycopyrrol-Formoterol (BREZTRI AEROSPHERE) 160-9-4.8 MCG/ACT AERO Inhale 2 puffs into the lungs 2 (two) times daily. 09/18/19   Tanda Rockers, MD  gabapentin (NEURONTIN) 300 MG capsule Take 300 mg by mouth 3 (three) times daily.   02/02/19   [provider]  glipiZIDE (GLUCOTROL XL) 10 MG 24 hr tablet Take 10 mg by mouth daily with breakfast.     [provider]  hydrOXYzine (VISTARIL) 25 MG capsule Take 25 mg by mouth at bedtime.     [provider]  metFORMIN (GLUCOPHAGE) 1000 MG tablet Take 1,000 mg by mouth 2 (two) times daily with a meal.     [provider]  montelukast (SINGULAIR) 10 MG tablet Take 1 tablet (10 mg total) by mouth at bedtime.  09/20/19   Martyn Ehrich, NP  omeprazole (PRILOSEC) 40 MG capsule Take 1 capsule (40 mg total) by mouth daily. 12/14/17   [name], MD  Oxycodone HCl 10 MG TABS Take 10 mg by mouth 4 (four) times daily as needed for pain. 06/12/20   [provider]  potassium chloride SA (K-DUR,KLOR-CON) 20 MEQ tablet Take 40 mEq by mouth 2 (two) times daily.     [provider]  PRESCRIPTION MEDICATION Inhale into the lungs at bedtime. CPAP    [provider]  Respiratory Therapy Supplies (FLUTTER) DEVI Use flutter device 3 times a day 07/20/18   Martyn Ehrich, NP  torsemide (DEMADEX) 20 MG tablet Take 2 tablets (40 mg total) by mouth 2 (two) times daily. 07/17/20   Erlene Quan, PA-C  Triamcinolone Acetonide 0.025 % LOTN Apply 1 application topically 3 (three) times daily as needed (to affected Areas).     [provider]  valsartan (DIOVAN) 80 MG tablet Take 80 mg by mouth daily.     [provider]    Family History Family History  Problem Relation Age of Onset  . Diabetes Mother   . Hypertension Mother   . Diabetes Father   . Heart disease Paternal Aunt   . Cancer Paternal Aunt   . Esophageal cancer Neg Hx   . Stomach cancer Neg Hx     Social History Social History   Tobacco Use  . Smoking status: Current Every Day Smoker    Packs/day: 1.00    Years: 24.00    Pack years: 24.00    Types: Cigarettes  . Smokeless tobacco: Never Used  Vaping Use  . Vaping Use: Every day  .  Substances: Nicotine  Substance Use Topics  . Alcohol use: No    Alcohol/week: 0.0 standard drinks    Comment: quit Nov. 2014  . Drug use: No     Allergies   Aspirin   Review of Systems Review of Systems  Constitutional: Negative for fatigue and fever.  HENT: Negative for congestion, sinus pressure and sore throat.   Eyes: Negative for photophobia, pain and visual disturbance.  Respiratory: Negative for cough and shortness of breath.   Cardiovascular: Negative for chest pain.  Gastrointestinal: Negative for abdominal pain, nausea and vomiting.  Genitourinary: Negative for decreased urine volume and hematuria.  Musculoskeletal: Positive for myalgias and neck pain. Negative for neck stiffness.  Neurological: Negative for dizziness, syncope, facial asymmetry, speech difficulty, weakness, light-headedness, numbness and headaches.     Physical Exam Triage Vital Signs ED Triage Vitals [10/15/20 0856]  Enc Vitals Group     BP 131/71     Pulse Rate 90     Resp 18     Temp 97.8 F (36.6 C)     Temp Source Oral     SpO2 99 %     Weight      Height      Head Circumference      Peak Flow      Pain Score 8     Pain Loc      Pain Edu?      Excl. in [place]?    No data found.  Updated Vital Signs BP 131/71 (BP Location: Left Arm)   Pulse 90   Temp 97.8 F (36.6 C) (Oral)   Resp 18   LMP 10/15/2018 Comment: patient had pregnancy test with PCP (test was negative)  SpO2 99%   Visual Acuity Right Eye Distance:  Left Eye Distance:   Bilateral Distance:    Right Eye Near:   Left Eye Near:    Bilateral Near:     Physical Exam Vitals and nursing note reviewed.  Constitutional:      Appearance: She is well-developed and well-nourished.     Comments: No acute distress  HENT:     Head: Normocephalic and atraumatic.     Nose: Nose normal.  Eyes:     Conjunctiva/sclera: Conjunctivae normal.  Cardiovascular:     Rate and Rhythm: Normal rate and regular rhythm.   Pulmonary:     Effort: Pulmonary effort is normal. No respiratory distress.  Abdominal:     General: There is no distension.  Musculoskeletal:        General: Normal range of motion.     Cervical back: Neck supple.     Comments: Diffuse tenderness throughout mid to lower cervical spine midline as well as throughout bilateral cervical musculature extending into SCM and superior trapezius  Full active range of motion of bilateral shoulders  Skin:    General: Skin is warm and dry.  Neurological:     Mental Status: She is alert and oriented to person, place, and time.  Psychiatric:        Mood and Affect: Mood and affect normal.      UC Treatments / Results  Labs (all labs ordered are listed, but only abnormal results are displayed) Labs Reviewed - No data to display  EKG   Radiology No results found.  Procedures Procedures (including critical care time)  Medications Ordered in UC Medications  ketorolac (TORADOL) 30 MG/ML injection 30 mg (30 mg Intramuscular Given 10/15/20 1050)    Initial Impression / Assessment and Plan / UC Course  I have reviewed the triage vital signs and the nursing notes.  Pertinent labs & imaging results that were available during my care of the patient were reviewed by me and considered in my medical decision making (see chart for details).     X-ray showing degenerative changes, spondylolysis, treating with course of prednisone, Toradol prior to discharge, supplement tizanidine, recommending follow-up with sports medicine/orthopedics given neck pain has become chronic.  Contact provided.  Discussed strict return precautions. Patient verbalized understanding and is agreeable with plan.  Final Clinical Impressions(s) / UC Diagnoses   Final diagnoses:  Bilateral neck pain     Discharge Instructions     X-ray showing some arthritis/degenerative changes in neck; please follow-up with sports medicine for further treatment and evaluation of  neck pain We gave you a shot of Toradol Begin prednisone taper x6 days-please monitor sugars, drink plenty of water and adhere to diabetes medicine while on this Supplement tizanidine at home/bedtime, this is muscle relaxer, may cause drowsiness Gentle neck stretching-see attached Alternate ice and heat Follow-up if not improving or worsening    ED Prescriptions    Medication Sig Dispense Auth. Provider   predniSONE (DELTASONE) 10 MG tablet Begin with 6 tabs on day 1, 5 tab on day 2, 4 tab on day 3, 3 tab on day 4, 2 tab on day 5, 1 tab on day 6-take with food 21 tablet Kacee Sukhu C, PA-C   tiZANidine (ZANAFLEX) 4 MG tablet Take 0.5-1 tablets (2-4 mg total) by mouth every 6 (six) hours as needed for muscle spasms. 30 tablet Michio Thier, [place] C, PA-C     PDMP not reviewed this encounter.   Janith Lima, PA-C 10/15/20 1054

## 2020-10-18 ENCOUNTER — Other Ambulatory Visit: Payer: Self-pay

## 2020-10-18 ENCOUNTER — Ambulatory Visit (INDEPENDENT_AMBULATORY_CARE_PROVIDER_SITE_OTHER): Payer: Medicaid Other | Admitting: Family Medicine

## 2020-10-18 VITALS — BP 129/65 | Ht 62.0 in | Wt 255.0 lb

## 2020-10-18 DIAGNOSIS — M542 Cervicalgia: Secondary | ICD-10-CM | POA: Diagnosis not present

## 2020-10-18 NOTE — Patient Instructions (Addendum)
It was great to meet you today! Thank you for letting me participate in your care!  Today, we discussed your left sided neck pain. I am sending you to physical therapy to see if we can get this better. In the meantime, continue using Voltaren gel on the area that hurts. If physical therapy does not help I will see you back in 4 weeks and we will proceed with an MRI.  Be well, Harolyn Rutherford, DO PGY-4, Sports Medicine Fellow [place]

## 2020-10-18 NOTE — Progress Notes (Addendum)
SUBJECTIVE:   CHIEF COMPLAINT / HPI:   Neck pain [name] is a very pleasant 50 year old female who presents as a new patient to this office due to ongoing left-sided neck pain.  It began in July she was helping bathe one of her friends mother.  She is not sure what happened at this time but felt like she might of just move wrong or turn wrong and she has slowly been having left-sided neck pain that radiates to her shoulder since.  She states it has slowly and progressively gotten worse and now sometimes radiates down the back of her left arm and stops at the elbow.  She has also noticed intermittent numbness and tingling over the entirety of her left hand.  She has never injured her shoulder or neck or never had any symptoms like this before.  Turning to the right or leaning back tends to exacerbate her symptoms.  She did go to urgent care recently and got prednisone and a muscle relaxer and that unfortunately has provided very little relief.  Of note she is in pain management taking oxycodone for other issues and unfortunately this does not seem to be helping or providing any relief either.  PERTINENT  PMH / PSH: CHF, asthma, COPD, GERD, type 2 diabetes, history of bipolar affective disorder, history of schizophrenia  OBJECTIVE:   BP 129/65   Ht 5\' 2"  (1.575 m)   Wt 255 lb (115.7 kg)   LMP 10/15/2018 Comment: patient had pregnancy test with PCP (test was negative)  BMI 46.64 kg/m   No flowsheet data found.  Cervical spine:  - Inspection: no gross deformity or asymmetry, swelling or ecchymosis. No skin changes - Palpation: TTP over the paraspinal muscles, none over cervical spinous process - ROM: limited active  ROM of the cervical spine in flexion and extension and right rotation. - Strength: 5/5 strength of left and right upper extremity in C5-T1 distribution - Neuro: sensation intact in the L4-S1 nerve root distribution b/l Equivocal Spurling's test   Shoulder,  Left: No evidence of bony deformity, asymmetry, or muscle atrophy; No tenderness over long head of biceps (bicipital groove). No TTP at [facility] joint. Full active and passive range of motion (180 flex Huel Cote /150Abd /90ER /70IR), Strength 5/5 throughout. Sensation intact. Peripheral pulses intact.  Special Tests:   - Crossarm test: NEG - Jobe test: NEG   - Hawkins: NEG   - Neer test: NEG   - Belly press test: NEG   - Drop arm test: NEG   - Painful Arc test: NEG  Cervical spine x-rays Significant spurring at the anterior vertebral bodies of from C3-C6.  Disc base maintain however she has multilevel facet arthropathy within the cervical spine as well.  Multilevel cervical spondylosis.   ASSESSMENT/PLAN:   Neck pain Patient history, presentation, x-rays, and physical exam more consistent with her pain being a result from multilevel facet arthropathy and degenerative changes of the cervical spine.  However given her symptoms I am somewhat concerned for possible nerve root impingement which could be coming from neuroforaminal stenosis.  We will continue with muscle relaxer as this did provide some help and also recommend she try Voltaren gel 4 times daily.  In the meantime we will start her with formal physical therapy and I will see her back in 4 weeks if she has made no significant improvement we will proceed with MRI of the C-spine.     Nuala Alpha, DO PGY-4, Sports Medicine Fellow  [place]  I was the preceptor for this visit and available for immediate consultation Shellia Cleverly, DO

## 2020-10-18 NOTE — Assessment & Plan Note (Signed)
Patient history, presentation, x-rays, and physical exam more consistent with her pain being a result from multilevel facet arthropathy and degenerative changes of the cervical spine.  However given her symptoms I am somewhat concerned for possible nerve root impingement which could be coming from neuroforaminal stenosis.  We will continue with muscle relaxer as this did provide some help and also recommend she try Voltaren gel 4 times daily.  In the meantime we will start her with formal physical therapy and I will see her back in 4 weeks if she has made no significant improvement we will proceed with MRI of the C-spine.

## 2020-10-24 IMAGING — DX DG CHEST 2V
2 series · 2 of 2 positions shown · non-contrast
Comparison: PA and lateral chest 07/01/2018 and 05/06/2017.

CLINICAL DATA: Central left chest pain for 3 days with shortness of
breath.

EXAM:
CHEST - 2 VIEW

[chest lat]
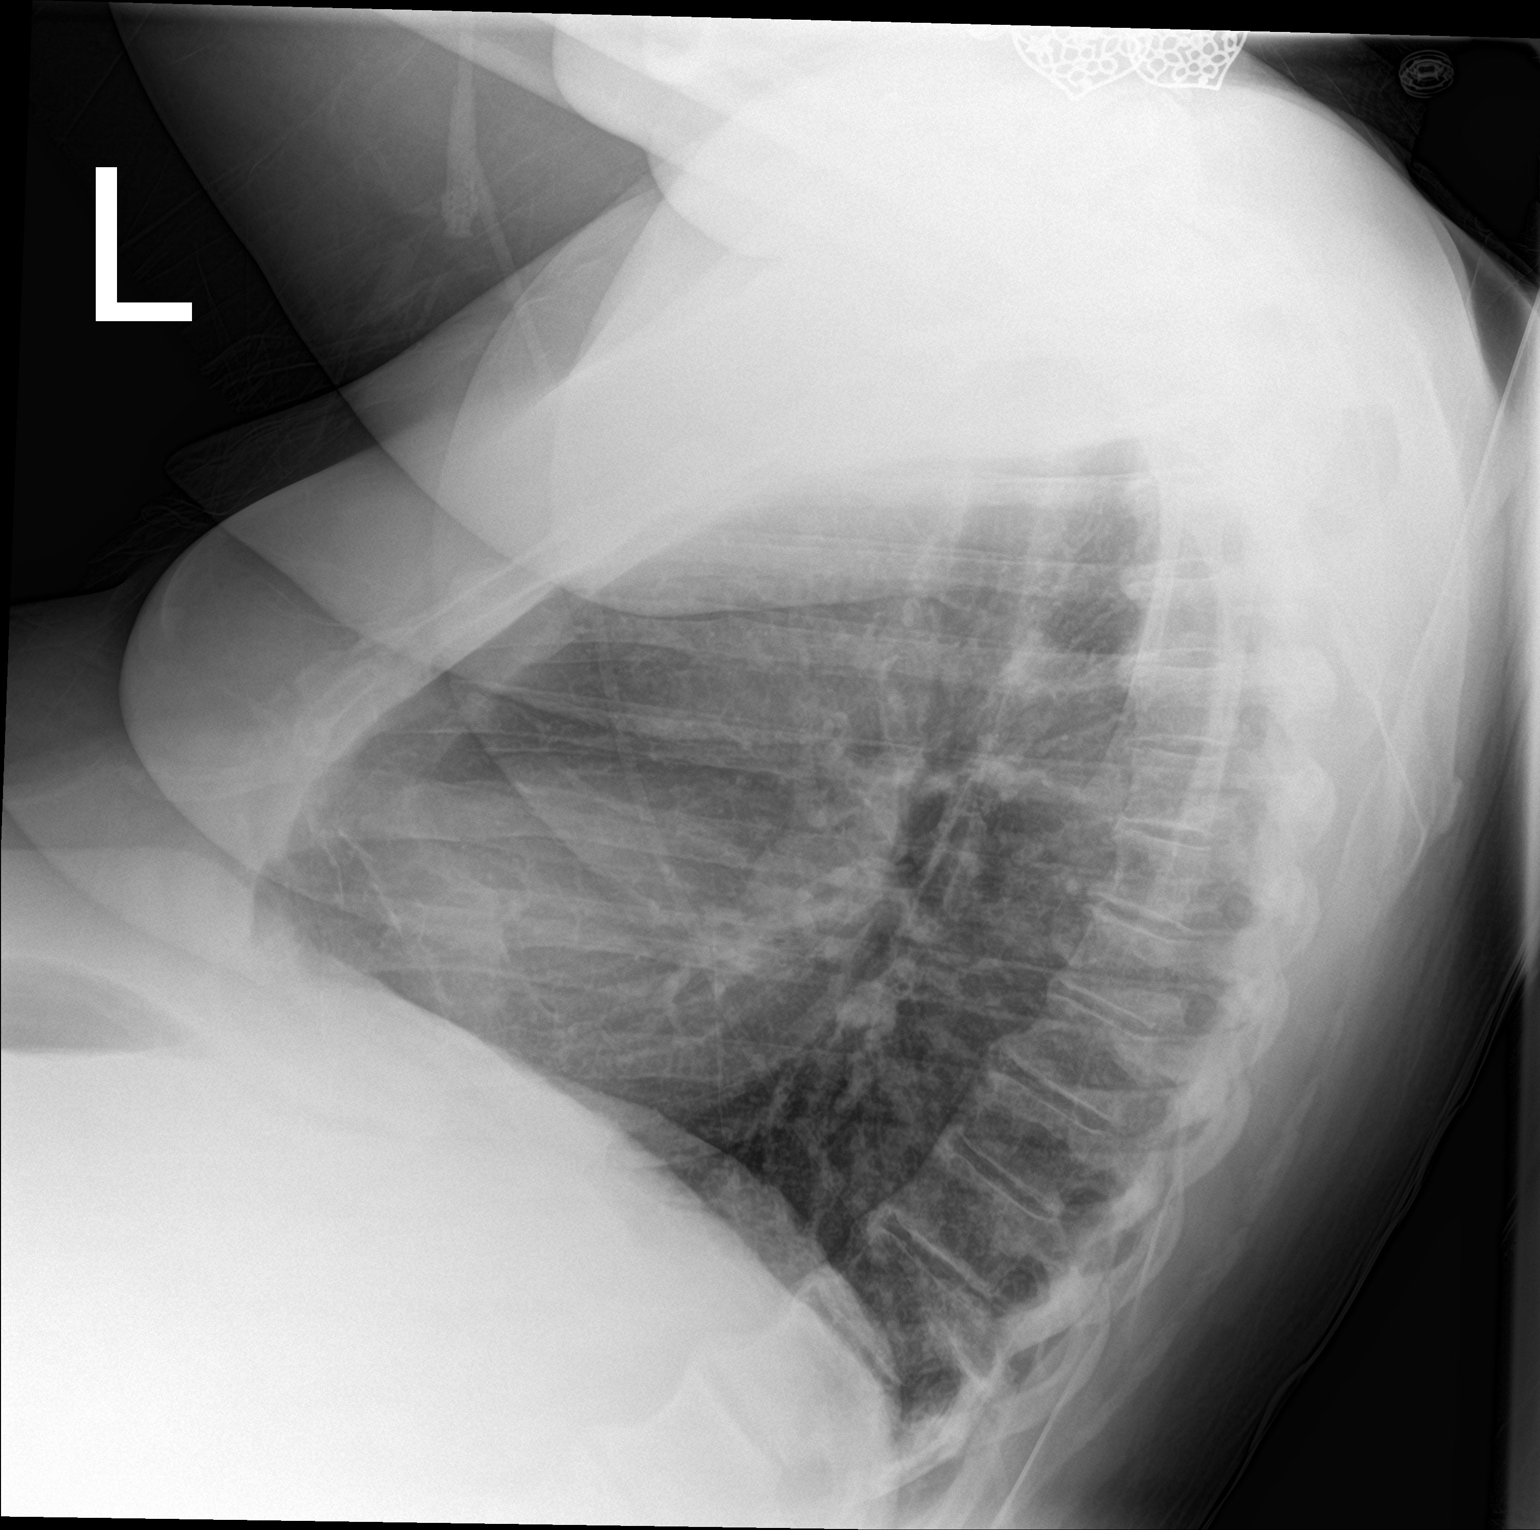

[chest ap]
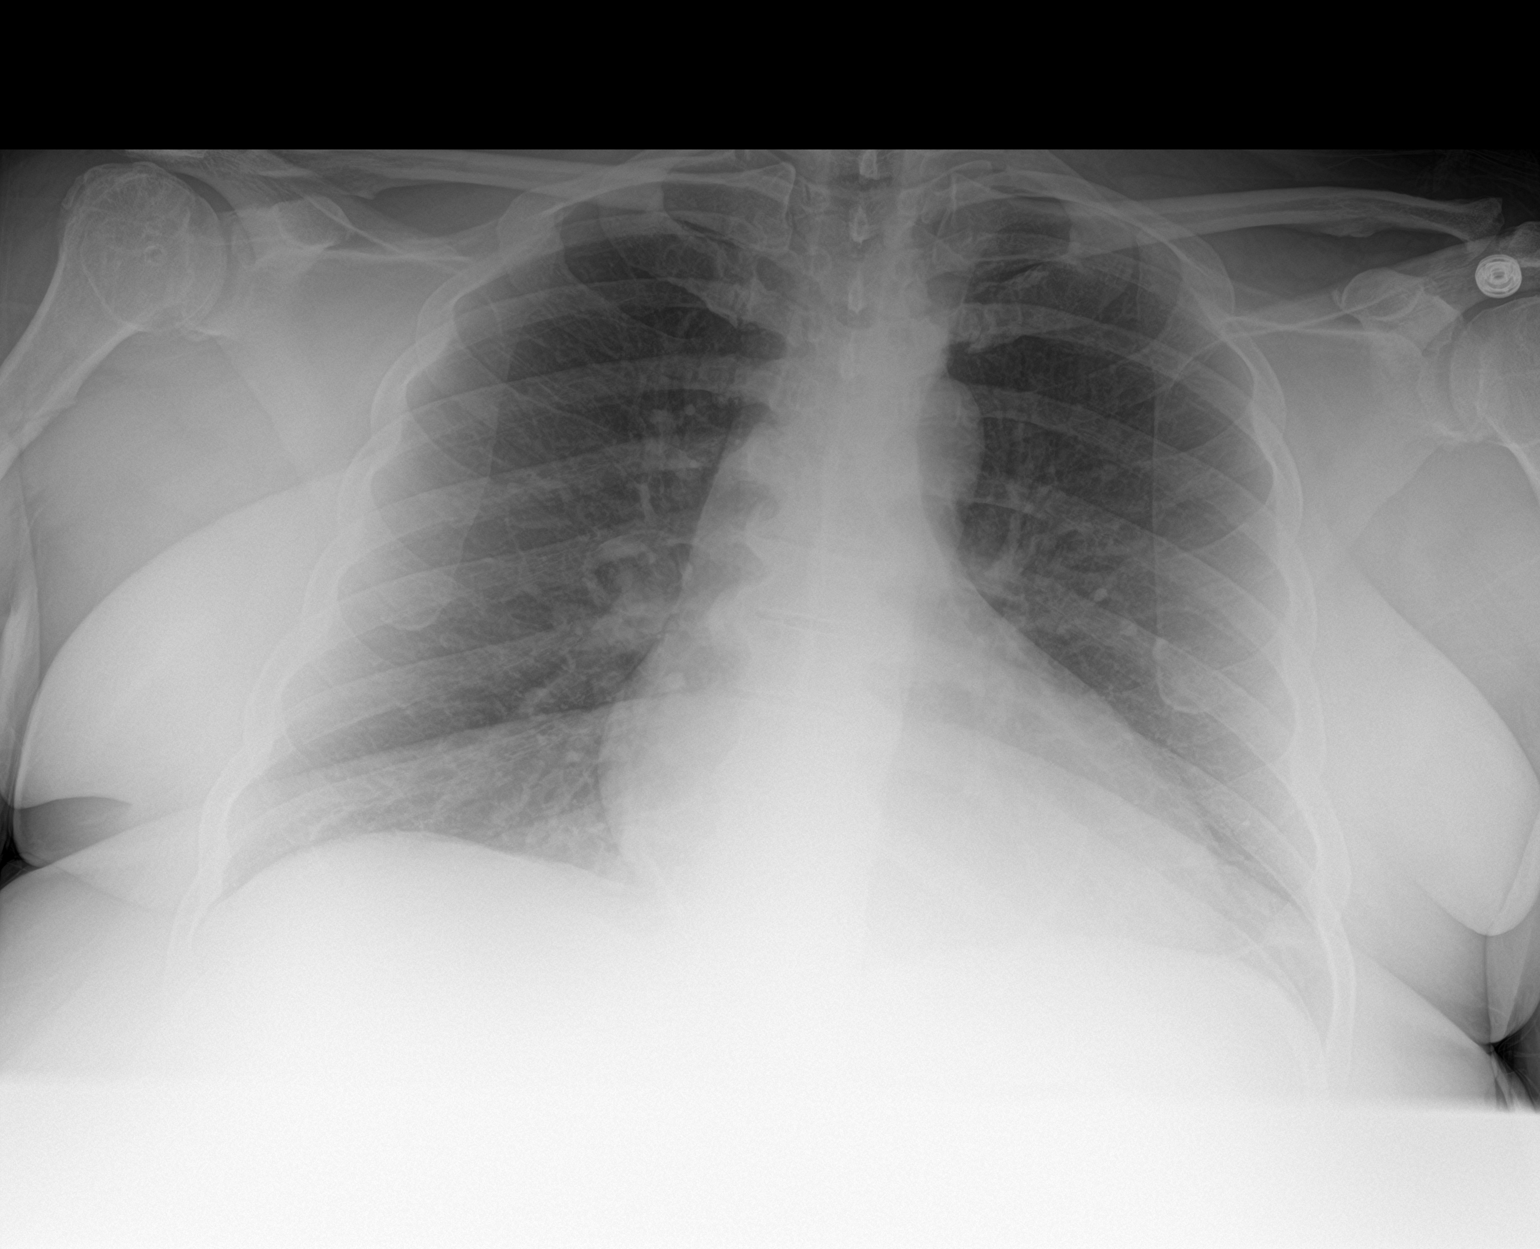

[2 of 2 positions shown; findings below may reference images not displayed]

FINDINGS: Lungs are clear. Heart size is upper normal. No pneumothorax or
pleural fluid. No acute or focal bony abnormality. Degenerative
disease right shoulder noted.
IMPRESSION: No acute disease.

## 2020-10-24 IMAGING — CT CT HEAD W/O CM
4 series · 17 of 47 positions shown, 19 images · non-contrast
Comparison: MRI 07/07/2017, CT brain 07/07/2017

CLINICAL DATA: Headache for 1 week

EXAM:
CT HEAD WITHOUT CONTRAST
TECHNIQUE: Contiguous axial images were obtained from the base of the skull
through the vertex without intravenous contrast.

[Series 3: head bone · axial · 0.42mm/px · z∈[-188,-132]mm · 4 of 79 slices shown]
[im 8/79  bone]
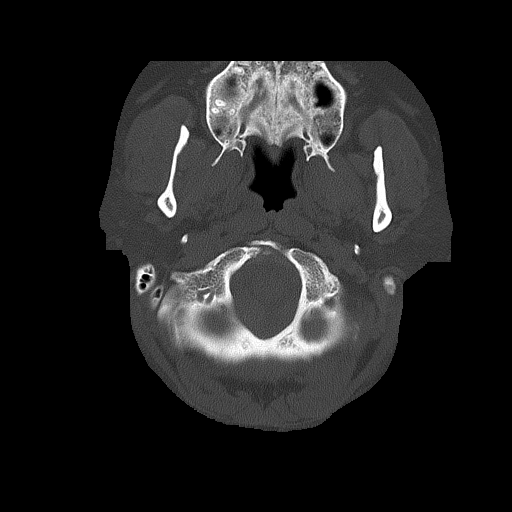
[im 16/79  bone]
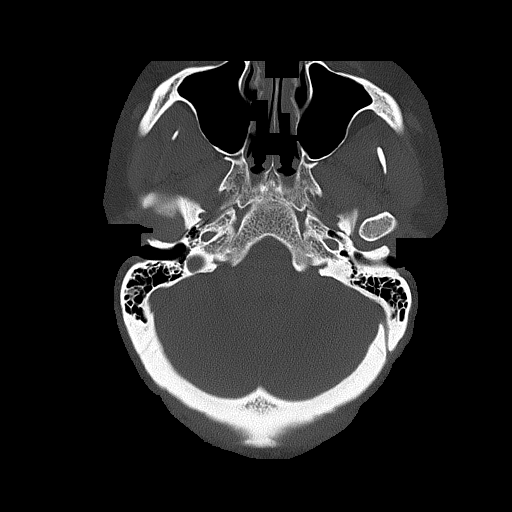
[im 24/79  bone]
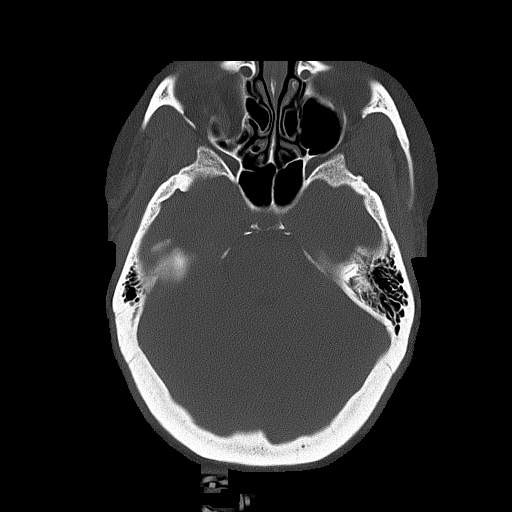
[im 36/79  bone]
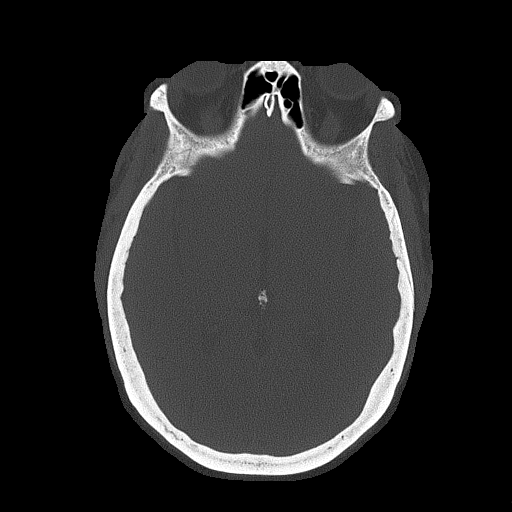

[Series 4: head without · axial · non-contrast · 0.42mm/px · z∈[-187,-67]mm · 7 of 32 slices shown, 9 images]
[im 4/32  brain]
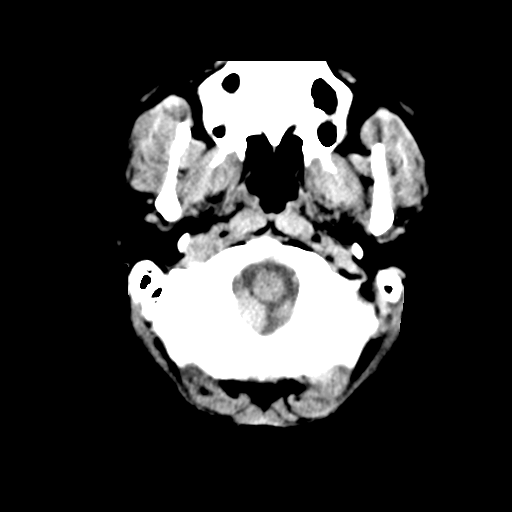
[im 4/32  bone]
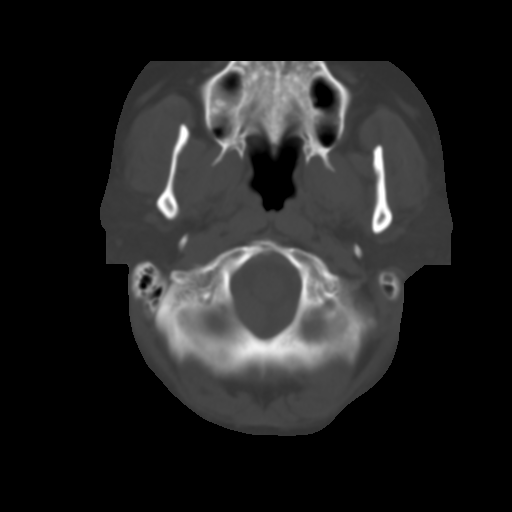
[im 8/32  brain]
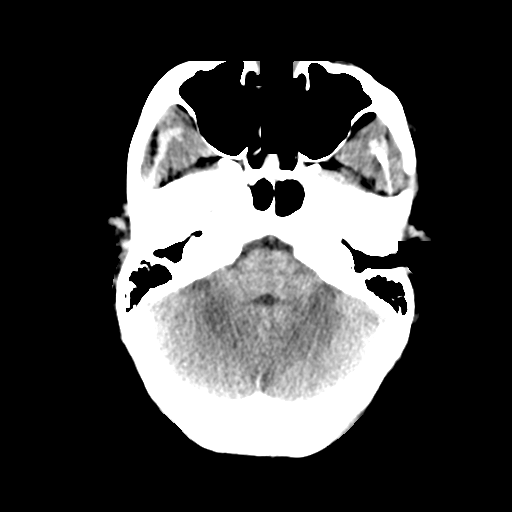
[im 12/32  brain]
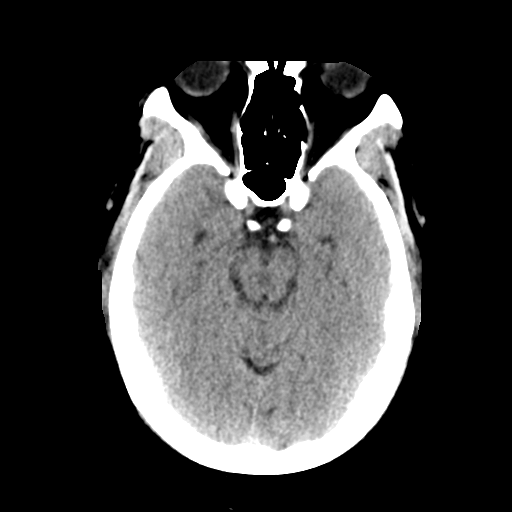
[im 16/32  brain]
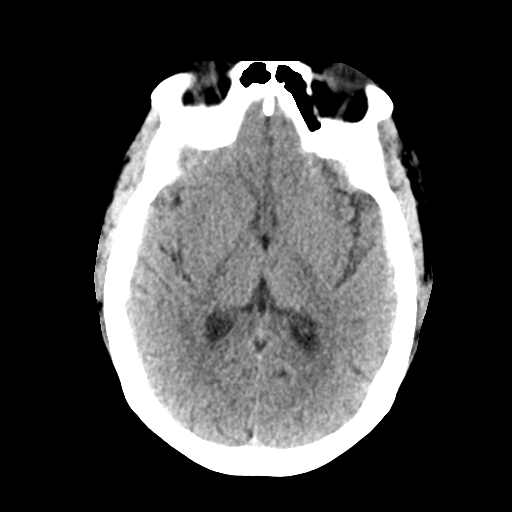
[im 20/32  brain]
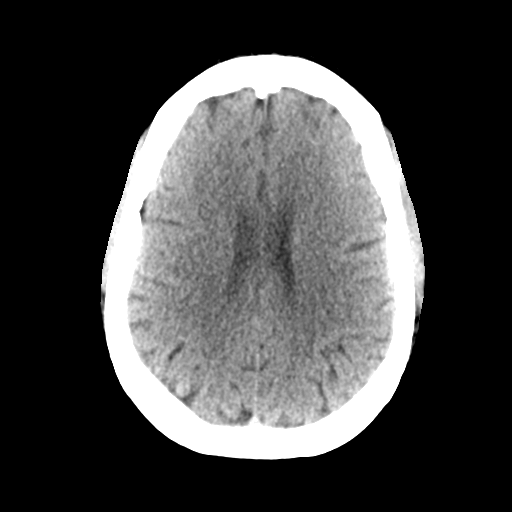
[im 20/32  bone]
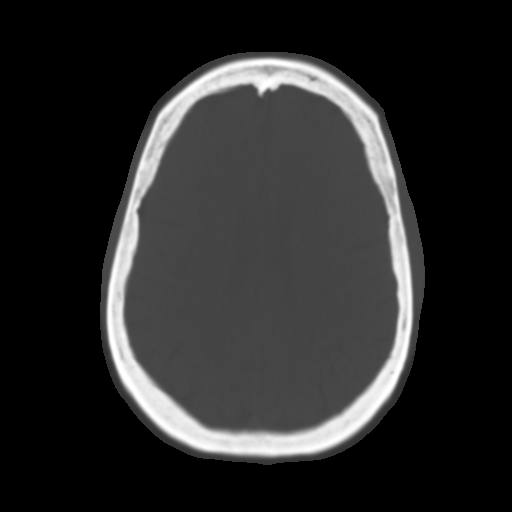
[im 24/32  brain]
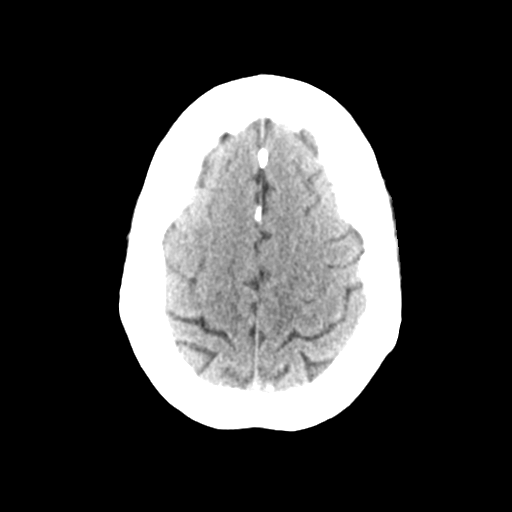
[im 28/32  brain]
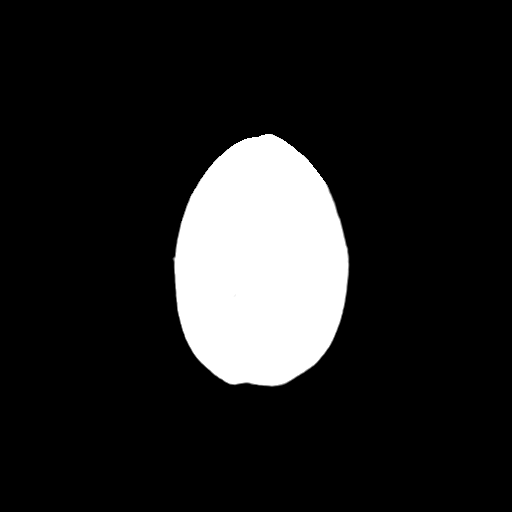

[Series 5: head without cor · coronal · non-contrast · 0.29mm/px · 3 of 67 slices shown]
[im 23/67  brain]
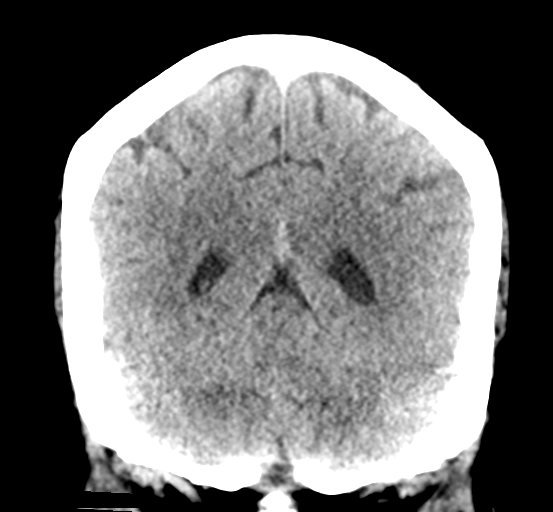
[im 30/67  brain]
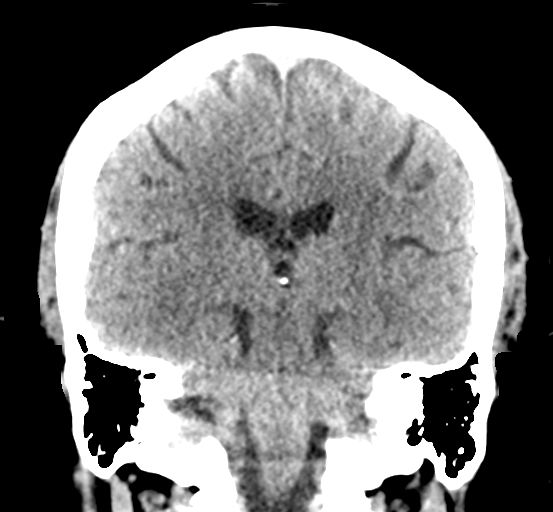
[im 37/67  brain]
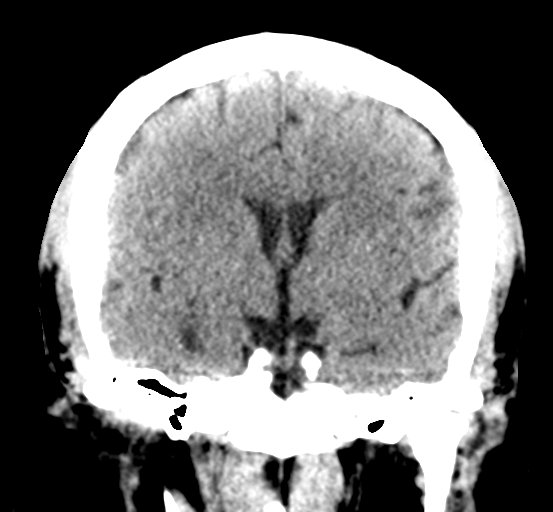

[Series 6: head without sag · sagittal · non-contrast · 0.31mm/px · 3 of 53 slices shown]
[im 18/53  brain]
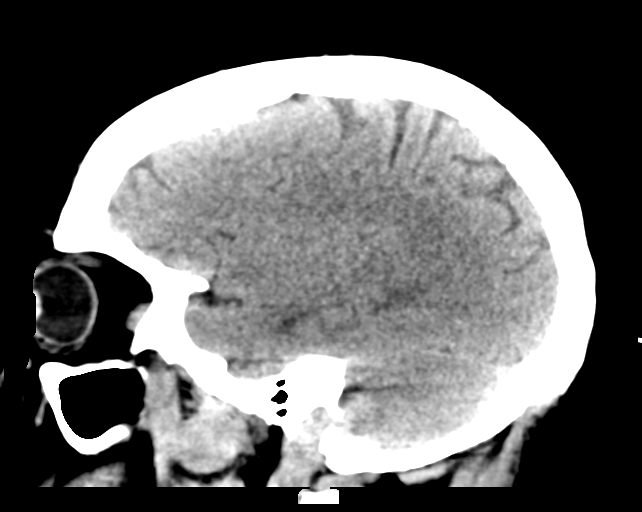
[im 27/53  brain]
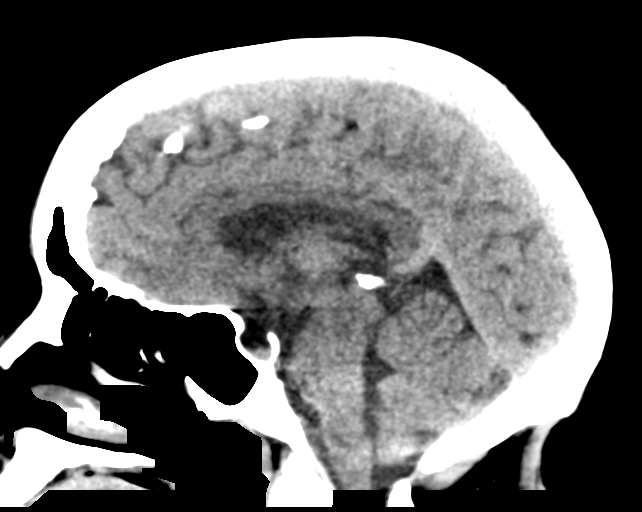
[im 35/53  brain]
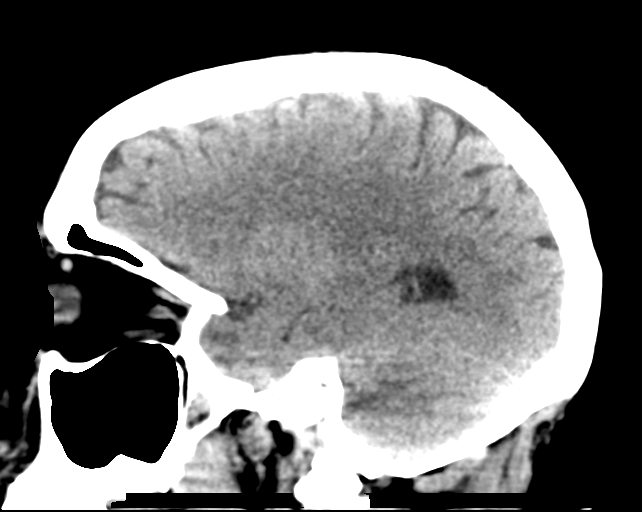

[17 of 47 positions shown; findings below may reference images not displayed]

FINDINGS: Brain: No acute territorial infarction, hemorrhage, or intracranial
mass. The ventricles are of normal size.

Vascular: No hyperdense vessel or unexpected calcification.

Skull: Normal. Negative for fracture or focal lesion.

Sinuses/Orbits: Mild mucosal thickening in the right maxillary
sinus. No acute abnormality.

Other: None
IMPRESSION: Negative non contrasted CT appearance of the brain

## 2020-11-12 ENCOUNTER — Ambulatory Visit: Payer: Medicaid Other | Admitting: Physical Therapy

## 2020-11-12 ENCOUNTER — Encounter: Payer: Self-pay | Admitting: Obstetrics

## 2020-11-12 ENCOUNTER — Ambulatory Visit (INDEPENDENT_AMBULATORY_CARE_PROVIDER_SITE_OTHER): Payer: Medicaid Other | Admitting: Obstetrics

## 2020-11-12 ENCOUNTER — Other Ambulatory Visit: Payer: Self-pay

## 2020-11-12 DIAGNOSIS — Z01419 Encounter for gynecological examination (general) (routine) without abnormal findings: Secondary | ICD-10-CM

## 2020-11-12 NOTE — Progress Notes (Signed)
Pt R/S appt.

## 2020-11-26 ENCOUNTER — Encounter: Payer: Self-pay | Admitting: Internal Medicine

## 2020-11-26 ENCOUNTER — Other Ambulatory Visit: Payer: Self-pay

## 2020-11-26 ENCOUNTER — Ambulatory Visit (INDEPENDENT_AMBULATORY_CARE_PROVIDER_SITE_OTHER): Payer: Medicaid Other | Admitting: Internal Medicine

## 2020-11-26 DIAGNOSIS — J4530 Mild persistent asthma, uncomplicated: Secondary | ICD-10-CM

## 2020-11-26 DIAGNOSIS — F17210 Nicotine dependence, cigarettes, uncomplicated: Secondary | ICD-10-CM | POA: Diagnosis not present

## 2020-11-26 DIAGNOSIS — G4733 Obstructive sleep apnea (adult) (pediatric): Secondary | ICD-10-CM

## 2020-11-26 MED ORDER — BREZTRI AEROSPHERE 160-9-4.8 MCG/ACT IN AERO: 2.0000 | INHALATION_SPRAY | Freq: Two times a day (BID) | RESPIRATORY_TRACT | 0 refills | Status: DC

## 2020-11-26 NOTE — Assessment & Plan Note (Signed)
Counseled re importance of smoking cessation but did not meet time criteria for separate billing   °

## 2020-11-26 NOTE — Assessment & Plan Note (Signed)
Body mass index is 48.29 kg/m.  -  trending down/ encouraged Lab Results  Component Value Date   TSH 0.331 (L) 08/29/2019     Contributing to osa/ doe/reviewed the need and the process to achieve and maintain neg calorie balance > defer f/u primary care including intermittently monitoring thyroid status    F/u with [name] for OSA planned and back here prn if wants to refill pulmonary meds thru this office (vs [place] vs PCP)          Each maintenance medication was reviewed in detail including emphasizing most importantly the difference between maintenance and prns and under what circumstances the prns are to be triggered using an action plan format where appropriate.  Total time for H and P, chart review, counseling, reviewing hfa device(s) and generating customized AVS unique to this office visit / same day charting = 31 min

## 2020-11-26 NOTE — Patient Instructions (Addendum)
Keep up the work on the weight and cutting down/ stopping the smoking  breztri is 1st thing in am, then the albuterol if you can't get comfortable within about 15 minutes with your breathing to let breztri have a change to work 1st   Work on inhaler technique:  relax and gently blow all the way out then take a nice smooth deep breath back in, triggering the inhaler at same time you start breathing in.  Hold for up to 5 seconds if you can. Blow breeztri out thru nose. Rinse and gargle with water when done   See [name] next available and ok for refill the meds if you are doing well are or return here yearly

## 2020-11-26 NOTE — Assessment & Plan Note (Addendum)
Onset of symptoms 2012 PFT's  11/10/2016  FEV1 2.06 (92 % ) ratio 81   p 24 % improvement from saba p ? prior to study with DLCO  93/94 % corrects to 102  % for alv volume   - 06/04/2017  After extensive coaching HFA effectiveness =    75% > try symbicort 80 2bid  - Spirometry 10/28/2017  FEV1 1.01 (45%)  Ratio 67  p am symb/saba/smoking  - 10/28/2017   try symb 160 2bid  - 03/04/2018  A try symb 80 2bid due to uacs component  - 04/01/18 try sym 160 2bid - Spirometry 07/04/2018  FEV1 1.1 (50%)  Ratio 69  Prior to rx - -  PFT's  07/20/18   FEV1 1.33 (60 % ) ratio 75  p 15 % improvement from saba p nothing prior to study with DLCO  81/78c % corrects to 104 % for alv volume  With ERV 6 and min curvature to f/v loop  And mild air trapping  - 02/13/2019 try off coreg and on bisoprolol - 09/18/2019    try change to bevespi 2bid > worse 05/10/2020 "hot weather"  No longer on maint rx  rec pred x 6 days and f/u asap with all meds in hand    - 11/26/2020  After extensive coaching inhaler device,  effectiveness =    50% (short Ti)    Despite reassuring pfts  Group D in terms of symptom/risk and laba/lama/ICS  therefore appropriate rx at this point >>>  Continue breztri and approp saba  Re saba: I spent extra time with pt today reviewing appropriate use of albuterol for prn use on exertion with the following points: 1) saba is for relief of sob that does not improve by walking a slower pace or resting but rather if the pt does not improve after trying this first. 2) If the pt is convinced, as many are, that saba helps recover from activity faster then it's easy to tell if this is the case by re-challenging : ie stop, take the inhaler, then p 5 minutes try the exact same activity (intensity of workload) that just caused the symptoms and see if they are substantially diminished or not after saba 3) if there is an activity that reproducibly causes the symptoms, try the saba 15 min before the activity on alternate days    If in fact the saba really does help, then fine to continue to use it prn but advised may need to look closer at the maintenance regimen being used to achieve better control of airways disease with exertion.

## 2020-11-26 NOTE — Progress Notes (Signed)
Subjective:     Patient ID: Lori Bean, female   DOB: 14-May-1971      MRN: [id number]    Brief patient profile:  50 yobf MO active smoker  healthy child/ 2 IUP's  With baseline wt < 200 with new doe x 2012 assoc with wt gain gain x 2016 referred to pulmonary clinic 06/04/2017 by Dr   Vista Lawman with dx mild asthma ? Early copd   Admit date: 12/17/2016 Discharge date: 12/18/2016  Discharge Diagnoses:  Possible asthma exacerbation Essential hypertension Chronic diastolic heart failure Prediabetes Morbid obesity  History of present illness:  50 y.o.femalewith medical history significant of Asthma and OSA (reversibility demonstrated on PFTs in Feb this year), smoking, HTN, CHF grade 2 diastolic dysfunction. Patient presents to the ED with c/o SOB. Symptoms onset at rest yesterday, also has 10lb weight gain over past 3 weeks but states she is compliant with lasix 80 BID. 2 albuterol nebs before seeing PCP this AM. PCP sent her to ED for evaluation.  Hospital Course:  Possible asthma exacerbation -Patient states that her wheezing has actually improved and she is feeling better today -She recently finished a course of prednisone back in February 2018 -She has been following up with the pulmonary clinic -Chest x-ray -Spoke with Dr. [name], patient's pulmonologist. Of note, progress note from Dr. [name] back in January 2018 stated that he was not convinced of her asthma. At this time, Dr. [name] recommended no change in patient's medications. Follow-up as an outpatient. -Continue Advair Diskus, pro-air, QVAR -Patient was placed on prednisone 40 mg, will continue this for 4 days.  Essential hypertension -Continue amlodipine -Lasix was discontinued as she does complain of chronic cough  Chronic diastolic heart failure -Echocardiogram 05/24/2015 showed EF of 3295%, grade 2 diastolic dysfunction -Currently appears euvolemic and compensated on exam. -BNP 24.8 -Continue home Lasix  dose with potassium supplementation  Prediabetes -Hemoglobin A1c was 6.1 back in 2016. This can be followed and managed as an outpatient by patient's primary care physician.  Morbid obesity -BMI 47.1 -Patient only talk to her primary care physician regarding weight loss and lifestyle modifications. -Patient states she feels that she has gained 10-15 pounds in the past several weeks. States she has been watching what she eats. Admits that she has been using different scales at different doctors offices. Inquires about whether there is a program that offers free scales. Case management stated that this program is no longer available.       06/04/2017 1st   office visit/ Wert  Re transition of care  Chief Complaint  Patient presents with  . Pulmonary Consult    Referred by Dr. Vista Lawman. Pt c/o DOE for approx 6 years, worse over the past wk to the point that she is winded just walking from room to room at home. She states also having some prod cough with large amounts of black colored sputum.  She has CP "stabbing pain" comes and goes and lasts for several minutes. She has a rash on her arm- not improving after seeing derm. She has also noticed blurred vision. She is using her proair inhaler 3-4 x per day.   first started on proair helped some, then symbicort sev months prior to OV>  Not much response, even neb now doesn't always help. 6 years prior to OV  Trouble flat and x 6 months sleeping upright due to sob immediately on lying back Doe progressive   But esp over the last week to room  to room now Coughing more since 2 months > black mucus assoc with cp with coughing  rec Plan A = Automatic = symbicort 80 Take 2 puffs first thing in am and then another 2 puffs about 12 hours later.  Pantoprazole (protonix) 40 mg (or prilosec 20 x 2)    Take  30-60 min before first meal of the day and Pepcid (famotidine)  20 mg one @  bedtime until return to office - this is the best way to tell whether  stomach acid is contributing to your problem.   Plan B = Backup Only use your albuterol as a rescue medication Plan C = Nebulizer  GERD diet   07/22/18 NP eval [name]  Continue on Symbicort Twice daily  . Rinse after use.  Work on not smoking    10/28/2017  f/u ov/Wert re: sob/ ? How much is asthma / still smoking  Chief Complaint  Patient presents with  . Follow-up    Breathing has been worse for the past wk- SOB walking from room to room. She is also c/o wheezing and some chest tightness. She is using her albuterol inhaler 4-5 x per day on average.   Dyspnea: room to room x one week Cough: more than usual x one week thick clear/ assoc st and dysphagia ? On ppi ac ?  Sleep: ok on cpap s 02   Using saba q am instead of prn  Midline pain with coughing only  rec Plan A = Automatic = change symbicort to 160 Take 2 puffs first thing in am and then another 2 puffs about 12 hours later.  Work on inhaler technique:     Plan B = Backup Only use your albuterol as a rescue medication Plan C = Crisis - only use your albuterol nebulizer if you first try Plan B       03/04/2018  f/u ov/Wert re: AB on symbicort 160/ acei/ coreg 25 bid  Chief Complaint  Patient presents with  . Follow-up    still SOB, sore throat, difficulty swallowing, productive cough   Dyspnea:  Room to room Cough: is worse at hs / like choking /  [place] mucus/ nose stuffy now all x 2 months on acei gradually worse  Sleep: on cpap with some cough/gag  SABA use:  None rec Plan A = Automatic = change symbicort to 80 Take 2 puffs first thing in am and then another 2 puffs about 12 hours later.  Work on inhaler technique:   Plan B = Backup Only use your albuterol as a rescue medication Plan C = Crisis - only use your albuterol nebulizer if you first try Plan B stop lisinopril and take avapro (ibesartan) 300 mg one daily in its place  Augmentin 875 mg take one pill twice daily  X 10 days - Prednisone 10 mg take  4 each am  x 2 days,   2 each am x 2 days,  1 each am x 2 days and stop  Please schedule a follow up office visit in 4 weeks, sooner if needed to see [name] NP but bring all meds with you      NP ov 04/01/18 rec Increase Symbicort 160/4.50mg 2 puffs Twice daily  , rinse after use.  Mucinex DM Twice daily  As needed  Cough/congestion  Work on not smoking .  Continue on BIPAP At bedtime   Low salt diet , keep legs elevated.  Follow up with Cardiology as discussed.  07/01/18 ER eval for sob Dx  chf  rx lasix     07/04/2018  Extended transition of care  f/u ov/Wert post er eval  re: copd/ab still smoking/ over using saba, did not use symb yet today and hfa poor / did not bring meds Chief Complaint  Patient presents with  . Follow-up    chronic asthma, patient uses albuterol TID  Dyspnea:  MMRC2 = can't walk a nl pace on a flat grade s sob but does fine slow and flat can shop HT/ [employer]  Cough: worse x sev weeks, better while on zpak worse off/ brown mucus 24/7 / assoc with overt HB Sleeping: on cpap, not bipap, bothered by cough / has not had any sleep f/u by [name] in years  SABA use: as above  rec Plan A = Automatic = symbicort 160 Take 2 puffs first thing in am and then another 2 puffs about 12 hours later.  Work on inhaler technique:    Plan B = Backup Only use your albuterol as a rescue medication  07/20/18  NP eval Start Zyrtec- take daily during allergy season or until symptoms improve  Start Mucinex twice daily for congestion  Flutter valve three times a day for congestion  Continue Symbicort 2 puffs twice a day (every day) Back up- Albuterol rescue inhaler 2 puffs every 4-6 hours as needed only for sob/wheezing     08/17/18 NP rec Pulmonary function test showed some restriction and mild obstruction consistent with early COPD - likely due to smoking and weight  Continue to work on quitting smoking  Adding Singulair, take once daily at bedtime- this is to help with allergy  symptoms and prevent asthma exacerbations (sent in RX) Adding Spiriva, take 2 puffs daily (given sample, RX sent to pharmacy) Continue Symbicort 160 - 2 puffs twice a day  Nicotine patch (RX sent)      11/21/2018  f/u ov/Wert re: copd/ ab  Still smoking/ maint on symb/spiriva  Chief Complaint  Patient presents with  . Follow-up    feeling about the same - cough (prod-brownish)    Dyspnea:  Room to room / does [employer] once week able to do an aisle leaning on cart = MMRC3 = can't walk 100 yards even at a slow pace at a flat grade s stopping due to sob   Cough: worse than usual x 2 weeks  When lies down with slt brown color, thicker than usual  Sleeping: on back 3 pillows and cpap  SABA use: not very helpful though hfa poor  02: none rec Zpak, No change in other medications Work on inhaler technique:  relax and gently blow all the way out then take a nice smooth deep breath back in, triggering the inhaler at same time you start breathing in.  Hold for up to 5 seconds if you can. Blow out thru nose. Rinse and gargle with water when done Only use your albuterol as a rescue medication to be used The key is to stop smoking completely before smoking completely stops you! - For smoking cessation classes call [phone]    Please schedule a follow up visit in 3 months but call sooner if needed and bring your inhalers with you  Add:  May need trial off coreg and on bisoprolol      02/13/2019  f/u ov/Wert re: copd/ab did not bring all  Inhalers / still on coreg 25 bid Chief Complaint  Patient presents with  . Follow-up  Chronic Asthma - patient stated medications are working for her, no issues.  Dyspnea:  Does [employer] once a week leaning on cart/ crosses parking lot  Cough: none  Sleeping: on bed / flat with 3 pillows / with cpap  SABA use: less than once a week 02: none rec Stop the coreg and start bisoprolol 5 gm one pill twice daily  Work on inhaler technique The key is to stop  smoking completely before smoking completely stops you!   Admit date: 08/28/2019 Discharge date: 08/31/2019    Recommendations for Outpatient Follow-up:  1. Complete azithromycin prednisone as an outpatient 2. New start to 70/30 insulin twice a day and Amaryl-patient cautioned on checking blood sugars at least twice a day with instructions given-supplies pen and pen needles given 3. Patient discharged with 3 in 1 commode on discharge 4. Some medications have changed please note changes including discontinuation ARB and Maxide  Discharge Diagnoses:  Active Problems:   Essential hypertension   Bipolar affective disorder (HCC)   GERD (gastroesophageal reflux disease)   Chronic diastolic heart failure (HCC)   OSA (obstructive sleep apnea)   Lymphedema   COPD with acute exacerbation ([place])   COPD exacerbation ([place])   Discharge Condition: Improved  Diet recommendation: Heart healthy       Filed Weights   08/30/19 0400 08/30/19 1800 08/30/19 2100  Weight: 125.8 kg 125.8 kg 125.7 kg    History of present illness:  49blk Femasthma/OSA, prior smoker, HTN, HFpEF EF 60-65% 04/14/2018 PASP 22-chronic back pain status post lumbar fusion 12/28/2013, venous thrombosis superficial 2013 schizophrenia Admitted 12/16 with 2 weeks thick productive sputum Rx bronchitis Z-Pak exposed to son who tested positive for Covid Rapid Covid test negative PCR test negative-repeat test 12:16 AM also negative  Hospital Course:  Baseline weight 05/01/2019 was 122kgon 02/14/2019 then went to 124 kg 05/01/2019 finally was 125.6 kg at cardiology office visit on admission 125.8   Assessment & Plan: Coronavirus negative x3 No further precautions transition to regular telemetry unit--biomarkers are not indicative acute inflammatory process arguing further against this however she will need to self isolate from her family if they are positive and figure this out in the outpatient setting Acute hypoxic  respiratory failure Combination of probable acute bronchitis + heart failure Rx Solu-Medrol every 12/14 given prednisone 40 to finish off a burst Home dose Lasix 80 twice daily changed to 60 IV twice daily 12/16-was changed to home dose 80 mg twice a day on discharge should take an extra 80 mg if he gains more than 2 kg in 24 hours Continue azithromycin until 12/19 HTN HFpEF Continue bisoprolol 5 twice daily, resume amlodipine 10-this admission discontinued Maxide daily, Diovan 80 daily and this may need to be reimplemented OSA Resume during hospital stay 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 Stable and understands how to care for self medications-meter ordered in addition to supplies Bipolar Continue Abilify 20 at bedtime, Cymbalta 30 twice daily   09/18/2019  Post hosp  f/u ov/Wert re:  AB/ did not bring meds  Chief Complaint  Patient presents with  . Follow-up    SOB, cough, congestion, mucus is grey  baseline taking symb/spiriva daily and with activity just once  Then gradually worse for a week prior to admit and did not call  Dyspnea:  Now riding scooter /  parks as close as she can  Cough: mostly hs / min mucoid  Sleeping: cough wakes her up / water helps  SABA use: using hfa only, up to twice on avg no longer neb  02: none  rec Prednisone 10 mg take  4 each am x 2 days,   2 each am x 2 days,  1 each am x 2 days and stop  Plan A = Automatic = Always=    Breztri  Take 2 puffs first thing in am and then another 2 puffs about 12 hours later.  Work on inhaler technique:     Plan B = Backup (to supplement plan A, not to replace it) Only use your albuterol inhaler as a rescue medication Plan C = Crisis (instead of Plan B but only if Plan B stops working) - only use your albuterol nebulizer if you first try  Plan B  Prednisone 10 mg take  4 each am x 2 days,   2 each am x 2 days,  1 each am x 2 days and stop  The key is to stop smoking completely before smoking completely stops you!   11/26/2020  f/u ov/Wert re: AB/ still smoking now on breaztri Chief Complaint  Patient presents with  . Follow-up    Breathing has improved some, relates to weight loss. She is using using her albuterol inhaler once daily- first thing in the am before Breztri. She rarely uses neb.   Dyspnea:  Walks at [employer] / [employer] pushing cart  Cough: much better no am excess  Sleeping: flat/ 3 pillows/ cpap SABA use: first thing in am 02: none  Covid status:   vax x 3    No obvious day to day or daytime variability or assoc excess/ purulent sputum or mucus plugs or hemoptysis or cp or chest tightness, subjective wheeze or overt sinus or hb symptoms.   sleeping without nocturnal  or early am exacerbation  of respiratory  c/o's or need for noct saba. Also denies any obvious fluctuation of symptoms with weather or environmental changes or other aggravating or alleviating factors except as outlined above   No unusual exposure hx or h/o childhood pna/ asthma or knowledge of premature birth.  Current Allergies, Complete Past Medical History, Past Surgical History, Family History, and Social History were reviewed in [employer] record.  ROS  The following are not active complaints unless bolded Hoarseness, sore throat, dysphagia, dental problems, itching, sneezing,  nasal congestion or discharge of excess mucus or purulent secretions, ear ache,   fever, chills, sweats, unintended wt loss or wt gain, classically pleuritic or exertional cp,  orthopnea pnd or arm/hand swelling  or leg swelling, presyncope, palpitations, abdominal pain, anorexia, nausea, vomiting, diarrhea  or change in bowel habits or change in bladder habits, change in stools or change in urine, dysuria, hematuria,  rash, arthralgias, visual  complaints, headache, numbness, weakness or ataxia or problems with walking or coordination,  change in mood or  memory.        Current Meds  Medication Sig  . albuterol (PROVENTIL) (2.5 MG/3ML) 0.083% nebulizer solution Take 2.5 mg by nebulization every 4 (four) hours as needed for wheezing or shortness of breath.  [name] albuterol (VENTOLIN HFA) 108 (90 Base) MCG/ACT inhaler Inhale 2 puffs into the lungs every 4 (four) hours as needed for wheezing or shortness of breath.  . ARIPiprazole (ABILIFY) 20 MG tablet Take 1 tablet (20 mg total) by mouth at bedtime.  [name]  blood glucose meter kit and supplies KIT Dispense based on patient and insurance preference. Use up to four times daily as directed. (FOR ICD-9 250.00, 250.01).  . Budeson-Glycopyrrol-Formoterol (BREZTRI AEROSPHERE) 160-9-4.8 MCG/ACT AERO Inhale 2 puffs into the lungs 2 (two) times daily.  [name] gabapentin (NEURONTIN) 300 MG capsule Take 300 mg by mouth 3 (three) times daily.   [name] glipiZIDE (GLUCOTROL XL) 10 MG 24 hr tablet Take 10 mg by mouth daily with breakfast.   . hydrOXYzine (VISTARIL) 25 MG capsule Take 25 mg by mouth at bedtime.   . metFORMIN (GLUCOPHAGE) 1000 MG tablet Take 1,000 mg by mouth 2 (two) times daily with a meal.   . montelukast (SINGULAIR) 10 MG tablet Take 1 tablet (10 mg total) by mouth at bedtime.  [name] omeprazole (PRILOSEC) 40 MG capsule Take 1 capsule (40 mg total) by mouth daily.  . Oxycodone HCl 10 MG TABS Take 10 mg by mouth 4 (four) times daily as needed for pain.  . potassium chloride SA (K-DUR,KLOR-CON) 20 MEQ tablet Take 40 mEq by mouth 2 (two) times daily.   [name] PRESCRIPTION MEDICATION Inhale into the lungs at bedtime. CPAP  . Respiratory Therapy Supplies (FLUTTER) DEVI Use flutter device 3 times a day  . tiZANidine (ZANAFLEX) 4 MG tablet Take 0.5-1 tablets (2-4 mg total) by mouth every 6 (six) hours as needed for muscle spasms.  [name] torsemide (DEMADEX) 20 MG tablet Take 2 tablets (40 mg total) by mouth 2 (two) times  daily.  . Triamcinolone Acetonide 0.025 % LOTN Apply 1 application topically 3 (three) times daily as needed (to affected Areas).   . valsartan (DIOVAN) 80 MG tablet Take 80 mg by mouth daily.                         Objective:   Physical Exam      11/26/2020       264 09/18/2019         280  02/13/2019         269  11/21/2018          264 07/04/2018      245  03/04/2018       240  10/28/2017       252   06/04/17 250 lb (113.4 kg)  05/14/17 250 lb 12.8 oz (113.8 kg)  05/06/17 242 lb (109.8 kg)    Vital signs reviewed  11/26/2020  - Note at rest 02 sats  96% on RA   General appearance:    Pleasant amb obese bf nad      HEENT : pt wearing mask not removed for exam due to covid - 19 concerns.   NECK :  without JVD/Nodes/TM/ nl carotid upstrokes bilaterally   LUNGS: no acc muscle use,  Min barrel  contour chest wall with bilateral  slightly decreased bs s audible wheeze and  without cough on insp or exp maneuvers and min  Hyperresonant  to  percussion bilaterally     CV:  RRR  no s3 or murmur or increase in P2, and 1+ pitting edema both LE's sym  ABD:  soft and nontender with pos end  insp Hoover's  in the supine position. No bruits or organomegaly appreciated, bowel sounds nl  MS:   Nl gait/  ext warm without deformities, calf tenderness, cyanosis or clubbing No obvious joint restrictions   SKIN: warm and dry without lesions    NEURO:  alert, approp, nl sensorium with  no motor or cerebellar deficits apparent.         I personally reviewed images and agree with radiology impression as follows:   Chest CTJan 31  2022 Bibasilar scarring, no med/ hilar nodes          Assessment:

## 2020-11-26 NOTE — Addendum Note (Signed)
Addended by: Rosana Berger on: 11/26/2020 12:13 PM   Modules accepted: Orders

## 2020-11-26 NOTE — Assessment & Plan Note (Addendum)
CPAP titration 07/2017 >Trial of BiPAP therapy on 16/12 cm H2O with a Medium size   Referred back to [name] / wt loss discussed separately

## 2020-12-02 ENCOUNTER — Other Ambulatory Visit: Payer: Self-pay

## 2020-12-02 ENCOUNTER — Ambulatory Visit (INDEPENDENT_AMBULATORY_CARE_PROVIDER_SITE_OTHER): Payer: Medicaid Other | Admitting: Obstetrics

## 2020-12-02 ENCOUNTER — Other Ambulatory Visit (HOSPITAL_COMMUNITY)
Admission: RE | Admit: 2020-12-02 | Discharge: 2020-12-02 | Disposition: A | Discharge status: Home / Self Care | Payer: Medicaid Other | Source: Ambulatory Visit | Attending: Obstetrics | Admitting: Obstetrics

## 2020-12-02 ENCOUNTER — Encounter: Payer: Self-pay | Admitting: Obstetrics

## 2020-12-02 VITALS — BP 151/92 | HR 101 | Ht 62.0 in | Wt 266.9 lb

## 2020-12-02 DIAGNOSIS — Z01419 Encounter for gynecological examination (general) (routine) without abnormal findings: Secondary | ICD-10-CM | POA: Diagnosis not present

## 2020-12-02 DIAGNOSIS — Z720 Tobacco use: Secondary | ICD-10-CM

## 2020-12-02 DIAGNOSIS — N898 Other specified noninflammatory disorders of vagina: Secondary | ICD-10-CM | POA: Insufficient documentation

## 2020-12-02 DIAGNOSIS — N951 Menopausal and female climacteric states: Secondary | ICD-10-CM

## 2020-12-02 DIAGNOSIS — Z113 Encounter for screening for infections with a predominantly sexual mode of transmission: Secondary | ICD-10-CM

## 2020-12-02 DIAGNOSIS — Z6841 Body Mass Index (BMI) 40.0 and over, adult: Secondary | ICD-10-CM

## 2020-12-02 NOTE — Progress Notes (Signed)
Patient presents for AEX. Patient would like to checked for STD  Last pap: 12/07/2017 +HPV Last MM: Dec 21 or Jan 22 Normal

## 2020-12-02 NOTE — Progress Notes (Addendum)
Subjective:        Lori Bean is a 50 y.o. female here for a routine exam.  Current complaints: Vaginal discharge, vaginal dryness and decreased libido.    Personal health questionnaire:  Is patient Ashkenazi Jewish, have a family history of breast and/or ovarian cancer: no Is there a family history of uterine cancer diagnosed at age < 60, gastrointestinal cancer, urinary tract cancer, family member who is a Field seismologist syndrome-associated carrier: no Is the patient overweight and hypertensive, family history of diabetes, personal history of gestational diabetes, preeclampsia or PCOS: yes Is patient over 3, have PCOS,  family history of premature CHD under age 14, diabetes, smoke, have hypertension or peripheral artery disease:  no At any time, has a partner hit, kicked or otherwise hurt or frightened you?: no Over the past 2 weeks, have you felt down, depressed or hopeless?: no Over the past 2 weeks, have you felt little interest or pleasure in doing things?:no   Gynecologic History Patient's last menstrual period was 10/15/2018. Contraception: post menopausal status Last Pap: 2019. Results were: NILM with HPV Last mammogram: 2021. Results were: normal  Obstetric History OB History  Gravida Para Term Preterm AB Living  4 2 0 _0 SAB IAB Ectopic Multiple Live Births  _1 # Outcome Date GA Lbr Len/2nd Weight Sex Delivery Anes PTL Lv  4 Preterm 06/10/98    F Vag-Spont   LIV  3 Preterm 05/26/93    M Vag-Spont   LIV  2 IAB           1 SAB             Past Medical History:  Diagnosis Date  . Abdominal hernia   . Anginal pain ([place])    occ from asthma  . Anxiety   . Arthritis   . Asthma   . Bipolar affective disorder ([place])    Schizophrenia  . Bronchitis    hx of  . CHF (congestive heart failure) ([place])   . Depression   . Diabetes mellitus without complication ([place])    " New Onset " per patient  . GERD (gastroesophageal reflux disease)   . Heart  murmur   . Hypertension    takes meds  . Lymphedema 06/07/2018  . Morbid (severe) obesity due to excess calories ([place])   . Neuropathy    left leg , "from back surgery"  . Overactive bladder   . Schizophrenia ([place])   . Sciatica   . Shortness of breath   . Sleep apnea   . Snoring disorder    Pt stated my boyfriend always wakes me up and tells me to breathe    Past Surgical History:  Procedure Laterality Date  . BACK SURGERY     3 back surgeries  . CHOLECYSTECTOMY    . COLONOSCOPY WITH PROPOFOL N/A 01/23/2016   Procedure: COLONOSCOPY WITH PROPOFOL;  Surgeon: Wonda Horner, MD;  Location: [facility];  Service: Endoscopy;  Laterality: N/A;  . EYE SURGERY     Metal plate in right eye. Had fracture in right eye  . gallstones reomved    . KNEE ARTHROSCOPY WITH MENISCAL REPAIR Right 09/28/2016   Procedure: KNEE ARTHROSCOPY WITH MENISCAL REPAIR;  Surgeon: Dorna Leitz, MD;  Location: [facility];  Service: Orthopedics;  Laterality: Right;  Right partial meniscectomy and chondroplasty, patellar/femoral joint and medial femoral condyle   . LUMBAR LAMINECTOMY/DECOMPRESSION MICRODISCECTOMY  04/01/2012  Procedure: LUMBAR LAMINECTOMY/DECOMPRESSION MICRODISCECTOMY 2 LEVELS;  Surgeon: Faythe Ghee, MD;  Location: [facility];  Service: Neurosurgery;  Laterality: Left;  Lumbar four-five, lumbar five sacral one microdiscectomy   . LUMBAR WOUND DEBRIDEMENT  04/29/2012   Procedure: LUMBAR WOUND DEBRIDEMENT;  Surgeon: Faythe Ghee, MD;  Location: [facility];  Service: Neurosurgery;  Laterality: N/A;  lumbar wound debridement  . ROTATOR CUFF REPAIR     Right shoulder  . TUBAL LIGATION       Current Outpatient Medications:  .  ACCU-CHEK GUIDE test strip, See admin instructions., Disp: , Rfl:  .  albuterol (PROVENTIL) (2.5 MG/3ML) 0.083% nebulizer solution, Take 2.5 mg by nebulization every 4 (four) hours as needed for wheezing or shortness of breath., Disp: , Rfl:  .  albuterol (VENTOLIN HFA) 108 (90  Base) MCG/ACT inhaler, Inhale 2 puffs into the lungs every 4 (four) hours as needed for wheezing or shortness of breath., Disp: , Rfl:  .  ARIPiprazole (ABILIFY) 20 MG tablet, Take 1 tablet (20 mg total) by mouth at bedtime., Disp: 30 tablet, Rfl: 0 .  blood glucose meter kit and supplies KIT, Dispense based on patient and insurance preference. Use up to four times daily as directed. (FOR ICD-9 250.00, 250.01)., Disp: 1 each, Rfl: 0 .  Budeson-Glycopyrrol-Formoterol (BREZTRI AEROSPHERE) 160-9-4.8 MCG/ACT AERO, Inhale 2 puffs into the lungs in the morning and at bedtime., Disp: 5.9 g, Rfl: 0 .  DULoxetine (CYMBALTA) 30 MG capsule, Take 30 mg by mouth 2 (two) times daily., Disp: , Rfl:  .  gabapentin (NEURONTIN) 300 MG capsule, Take 300 mg by mouth 3 (three) times daily. , Disp: , Rfl:  .  glipiZIDE (GLUCOTROL XL) 10 MG 24 hr tablet, Take 10 mg by mouth daily with breakfast. , Disp: , Rfl:  .  hydrOXYzine (VISTARIL) 25 MG capsule, Take 25 mg by mouth at bedtime. , Disp: , Rfl:  .  metFORMIN (GLUCOPHAGE) 1000 MG tablet, Take 1,000 mg by mouth 2 (two) times daily with a meal. , Disp: , Rfl:  .  montelukast (SINGULAIR) 10 MG tablet, Take 1 tablet (10 mg total) by mouth at bedtime., Disp: 30 tablet, Rfl: 11 .  omeprazole (PRILOSEC) 40 MG capsule, Take 1 capsule (40 mg total) by mouth daily., Disp: 90 capsule, Rfl: 0 .  Oxycodone HCl 10 MG TABS, Take 10 mg by mouth 4 (four) times daily as needed for pain., Disp: , Rfl:  .  phentermine (ADIPEX-P) 37.5 MG tablet, Take 37.5 mg by mouth daily., Disp: , Rfl:  .  potassium chloride SA (K-DUR,KLOR-CON) 20 MEQ tablet, Take 40 mEq by mouth 2 (two) times daily. , Disp: , Rfl:  .  PRESCRIPTION MEDICATION, Inhale into the lungs at bedtime. CPAP, Disp: , Rfl:  .  Respiratory Therapy Supplies (FLUTTER) DEVI, Use flutter device 3 times a day, Disp: 1 each, Rfl: 0 .  torsemide (DEMADEX) 20 MG tablet, Take 2 tablets (40 mg total) by mouth 2 (two) times daily., Disp: 360  tablet, Rfl: 3 .  TOVIAZ 4 MG TB24 tablet, Take 4 mg by mouth daily., Disp: , Rfl:  .  traZODone (DESYREL) 50 MG tablet, Take 50 mg by mouth at bedtime., Disp: , Rfl:  .  Triamcinolone Acetonide 0.025 % LOTN, Apply 1 application topically 3 (three) times daily as needed (to affected Areas). , Disp: , Rfl:  .  triamterene-hydrochlorothiazide (MAXZIDE-25) 37.5-25 MG tablet, Take 1 tablet by mouth daily., Disp: , Rfl:  .  TRULICITY  0.75 MG/0.5ML SOPN, Inject into the skin., Disp: , Rfl:  .  valsartan (DIOVAN) 80 MG tablet, Take 80 mg by mouth daily. , Disp: , Rfl:  .  Vitamin D, Ergocalciferol, (DRISDOL) 1.25 MG (50000 UNIT) CAPS capsule, Take 50,000 Units by mouth once a week., Disp: , Rfl:  Allergies  Allergen Reactions  . Aspirin Nausea And Vomiting    Social History   Tobacco Use  . Smoking status: Current Every Day Smoker    Packs/day: 1.00    Years: 24.00    Pack years: 24.00    Types: Cigarettes  . Smokeless tobacco: Never Used  Substance Use Topics  . Alcohol use: No    Alcohol/week: 0.0 standard drinks    Comment: quit Nov. 2014    Family History  Problem Relation Age of Onset  . Diabetes Mother   . Hypertension Mother   . Diabetes Father   . Heart disease Paternal Aunt   . Cancer Paternal Aunt   . Esophageal cancer Neg Hx   . Stomach cancer Neg Hx       Review of Systems  Constitutional: negative for fatigue and weight loss Respiratory: negative for cough and wheezing Cardiovascular: negative for chest pain, fatigue and palpitations Gastrointestinal: negative for abdominal pain and change in bowel habits Musculoskeletal:negative for myalgias Neurological: negative for gait problems and tremors Behavioral/Psych: negative for abusive relationship, depression Endocrine: negative for temperature intolerance    Genitourinary:negative for abnormal menstrual periods, genital lesions.  Positive for hot flashes,vaginal dryness, decreased libido and vaginal  discharge Integument/breast: negative for breast lump, breast tenderness, nipple discharge and skin lesion(s)    Objective:       BP (!) 151/92   Pulse (!) 101   Ht 5' 2" (1.575 m)   Wt 266 lb 14.4 oz (121.1 kg)   LMP 10/15/2018 Comment: patient had pregnancy test with PCP (test was negative)  BMI 48.82 kg/m  General:   alert and no distress  Skin:   no rash or abnormalities  Lungs:   clear to auscultation bilaterally  Heart:   regular rate and rhythm, S1, S2 normal, no murmur, click, rub or gallop  Breasts:   normal without suspicious masses, skin or nipple changes or axillary nodes  Abdomen:  normal findings: no organomegaly, soft, non-tender and no hernia  Pelvis:  External genitalia: normal general appearance Urinary system: urethral meatus normal and bladder without fullness, nontender 1. Encounter for gynecological examination with Papanicolaou smear of cerv Vaginal: normal without tenderness, induration or masses Cervix: normal appearance Adnexa: normal bimanual exam Uterus: anteverted and non-tender, normal size   Lab Review Urine pregnancy test Labs reviewed yes Radiologic studies reviewed yes  I have spent a total of 20 minutes of face-to-face time, excluding clinical staff time, reviewing notes and preparing to see patient, ordering tests and/or medications, and counseling the patient.   Assessment:    1. Encounter for gynecological examination with Papanicolaou smear of cervix Rx: - Cytology - PAP( [place] [facility])  2. Menopausal symptoms of vaginal dryness, hot flushes and decreased libido Rx: - Estroven OTC, take as directed - may also try some of the other natural herbal products  3. Vaginal discharge Rx: - Cervicovaginal ancillary only( [place])  4. Screening for STD (sexually transmitted disease) Rx: - Hepatitis B surface antigen - Hepatitis C antibody - HIV Antibody (routine testing w rflx) - RPR  5. Class 3 severe obesity due to excess  calories without serious comorbidity with body mass index (  BMI) of 45.0 to 49.9 in adult [facility]) - is on weight loss program with PCP and has lost ~ 20 lbs over the past 3-4 months  6. Tobacco abuse disorder - cessation with the aid of medication and behavioral modification recommended   Plan:    Education reviewed: calcium supplements, depression evaluation, low fat, low cholesterol diet, safe sex/STD prevention, self breast exams, smoking cessation and weight bearing exercise. Follow up in: 6 months.   No orders of the defined types were placed in this encounter.  Orders Placed This Encounter  Procedures  . Hepatitis B surface antigen  . Hepatitis C antibody  . HIV Antibody (routine testing w rflx)  . RPR    Shelly Bombard, MD 12/02/2020 12:22 PM

## 2020-12-02 NOTE — Patient Instructions (Signed)
https://www.womenshealth.gov/menopause/menopause-basics"> https://www.clinicalkey.com">  Menopause Menopause is the normal time of a woman's life when menstrual periods stop completely. It marks the natural end to a woman's ability to become pregnant. It can be defined as the absence of a menstrual period for 12 months without another medical cause. The transition to menopause (perimenopause) most often happens between the ages of 45 and 55, and can last for many years. During perimenopause, hormone levels change in your body, which can cause symptoms and affect your health. Menopause may increase your risk for:  Weakened bones (osteoporosis), which causes fractures.  Depression.  Hardening and narrowing of the arteries (atherosclerosis), which can cause heart attacks and strokes. What are the causes? This condition is usually caused by a natural change in hormone levels that happens as you get older. The condition may also be caused by changes that are not natural, including:  Surgery to remove both ovaries (surgical menopause).  Side effects from some medicines, such as chemotherapy used to treat cancer (chemical menopause). What increases the risk? This condition is more likely to start at an earlier age if you have certain medical conditions or have undergone treatments, including:  A tumor of the pituitary gland in the brain.  A disease that affects the ovaries and hormones.  Certain cancer treatments, such as chemotherapy or hormone therapy, or radiation therapy on the pelvis.  Heavy smoking and excessive alcohol use.  Family history of early menopause. This condition is also more likely to develop earlier in women who are very thin. What are the signs or symptoms? Symptoms of this condition include:  Hot flashes.  Irregular menstrual periods.  Night sweats.  Changes in feelings about sex. This could be a decrease in sex drive or an increased discomfort around your  sexuality.  Vaginal dryness and thinning of the vaginal walls. This may cause painful sex.  Dryness of the skin and development of wrinkles.  Headaches.  Problems sleeping (insomnia).  Mood swings or irritability.  Memory problems.  Weight gain.  Hair growth on the face and chest.  Bladder infections or problems with urinating. How is this diagnosed? This condition is diagnosed based on your medical history, a physical exam, your age, your menstrual history, and your symptoms. Hormone tests may also be done. How is this treated? In some cases, no treatment is needed. You and your health care provider should make a decision together about whether treatment is necessary. Treatment will be based on your individual condition and preferences. Treatment for this condition focuses on managing symptoms. Treatment may include:  Menopausal hormone therapy (MHT).  Medicines to treat specific symptoms or complications.  Acupuncture.  Vitamin or herbal supplements. Before starting treatment, make sure to let your health care provider know if you have a personal or family history of these conditions:  Heart disease.  Breast cancer.  Blood clots.  Diabetes.  Osteoporosis. Follow these instructions at home: Lifestyle  Do not use any products that contain nicotine or tobacco, such as cigarettes, e-cigarettes, and chewing tobacco. If you need help quitting, ask your health care provider.  Get at least 30 minutes of physical activity on 5 or more days each week.  Avoid alcoholic and caffeinated beverages, as well as spicy foods. This may help prevent hot flashes.  Get 7-8 hours of sleep each night.  If you have hot flashes, try: ? Dressing in layers. ? Avoiding things that may trigger hot flashes, such as spicy food, warm places, or stress. ? Taking slow, deep   breaths when a hot flash starts. ? Keeping a fan in your home and office.  Find ways to manage stress, such as deep  breathing, meditation, or journaling.  Consider going to group therapy with other women who are having menopause symptoms. Ask your health care provider about recommended group therapy meetings. Eating and drinking  Eat a healthy, balanced diet that contains whole grains, lean protein, low-fat dairy, and plenty of fruits and vegetables.  Your health care provider may recommend adding more soy to your diet. Foods that contain soy include tofu, tempeh, and soy milk.  Eat plenty of foods that contain calcium and vitamin D for bone health. Items that are rich in calcium include low-fat milk, yogurt, beans, almonds, sardines, broccoli, and kale.   Medicines  Take over-the-counter and prescription medicines only as told by your health care provider.  Talk with your health care provider before starting any herbal supplements. If prescribed, take vitamins and supplements as told by your health care provider. General instructions  Keep track of your menstrual periods, including: ? When they occur. ? How heavy they are and how long they last. ? How much time passes between periods.  Keep track of your symptoms, noting when they start, how often you have them, and how long they last.  Use vaginal lubricants or moisturizers to help with vaginal dryness and improve comfort during sex.  Keep all follow-up visits. This is important. This includes any group therapy or counseling.   Contact a health care provider if:  You are still having menstrual periods after age 55.  You have pain during sex.  You have not had a period for 12 months and you develop vaginal bleeding. Get help right away if you have:  Severe depression.  Excessive vaginal bleeding.  Pain when you urinate.  A fast or irregular heartbeat (palpitations).  Severe headaches.  Abdominal pain or severe indigestion. Summary  Menopause is a normal time of life when menstrual periods stop completely. It is usually defined as  the absence of a menstrual period for 12 months without another medical cause.  The transition to menopause (perimenopause) most often happens between the ages of 45 and 55 and can last for several years.  Symptoms can be managed through medicines, lifestyle changes, and complementary therapies such as acupuncture.  Eat a balanced diet that is rich in nutrients to promote bone health and heart health and to manage symptoms during menopause. This information is not intended to replace advice given to you by your health care provider. Make sure you discuss any questions you have with your health care provider. Document Revised: 05/31/2020 Document Reviewed: 02/15/2020 Elsevier Patient Education  2021 [employer].  

## 2020-12-03 LAB — CERVICOVAGINAL ANCILLARY ONLY
Bacterial Vaginitis (gardnerella): POSITIVE — AB
Candida Glabrata: NEGATIVE
Candida Vaginitis: NEGATIVE
Chlamydia: NEGATIVE
Comment: NEGATIVE
Comment: NEGATIVE
Comment: NEGATIVE
Comment: NEGATIVE
Comment: NEGATIVE
Comment: NORMAL
Neisseria Gonorrhea: NEGATIVE
Trichomonas: NEGATIVE

## 2020-12-03 LAB — RPR: RPR Ser Ql: NONREACTIVE

## 2020-12-03 LAB — HEPATITIS B SURFACE ANTIGEN: Hepatitis B Surface Ag: NEGATIVE

## 2020-12-03 LAB — HEPATITIS C ANTIBODY: Hep C Virus Ab: <0.1 s/co ratio (ref 0.0–0.9)

## 2020-12-03 LAB — HIV ANTIBODY (ROUTINE TESTING W REFLEX): HIV Screen 4th Generation wRfx: NONREACTIVE

## 2020-12-03 LAB — CYTOLOGY - PAP
Comment: NEGATIVE
Diagnosis: NEGATIVE
High risk HPV: NEGATIVE

## 2020-12-04 ENCOUNTER — Other Ambulatory Visit: Payer: Self-pay | Admitting: Obstetrics

## 2020-12-04 DIAGNOSIS — B9689 Other specified bacterial agents as the cause of diseases classified elsewhere: Secondary | ICD-10-CM

## 2020-12-04 DIAGNOSIS — N760 Acute vaginitis: Secondary | ICD-10-CM

## 2020-12-04 MED ORDER — METRONIDAZOLE 500 MG PO TABS: 500.0000 mg | ORAL_TABLET | Freq: Two times a day (BID) | ORAL | 2 refills | Status: DC

## 2020-12-15 ENCOUNTER — Encounter (HOSPITAL_COMMUNITY): Payer: Self-pay

## 2020-12-15 ENCOUNTER — Observation Stay (HOSPITAL_COMMUNITY)
Admission: EM | Admit: 2020-12-15 | Discharge: 2020-12-16 | Disposition: A | Discharge status: Home / Self Care | Payer: Medicaid Other | Attending: Internal Medicine | Admitting: Internal Medicine

## 2020-12-15 ENCOUNTER — Other Ambulatory Visit: Payer: Self-pay

## 2020-12-15 ENCOUNTER — Emergency Department (HOSPITAL_COMMUNITY): Payer: Medicaid Other

## 2020-12-15 DIAGNOSIS — F17210 Nicotine dependence, cigarettes, uncomplicated: Secondary | ICD-10-CM | POA: Diagnosis not present

## 2020-12-15 DIAGNOSIS — Z79899 Other long term (current) drug therapy: Secondary | ICD-10-CM | POA: Diagnosis not present

## 2020-12-15 DIAGNOSIS — G4733 Obstructive sleep apnea (adult) (pediatric): Secondary | ICD-10-CM

## 2020-12-15 DIAGNOSIS — Z20822 Contact with and (suspected) exposure to covid-19: Secondary | ICD-10-CM | POA: Insufficient documentation

## 2020-12-15 DIAGNOSIS — J4551 Severe persistent asthma with (acute) exacerbation: Secondary | ICD-10-CM

## 2020-12-15 DIAGNOSIS — M549 Dorsalgia, unspecified: Secondary | ICD-10-CM

## 2020-12-15 DIAGNOSIS — J45901 Unspecified asthma with (acute) exacerbation: Secondary | ICD-10-CM | POA: Diagnosis not present

## 2020-12-15 DIAGNOSIS — I5032 Chronic diastolic (congestive) heart failure: Secondary | ICD-10-CM | POA: Insufficient documentation

## 2020-12-15 DIAGNOSIS — M25562 Pain in left knee: Secondary | ICD-10-CM

## 2020-12-15 DIAGNOSIS — R062 Wheezing: Secondary | ICD-10-CM

## 2020-12-15 DIAGNOSIS — I110 Hypertensive heart disease with heart failure: Secondary | ICD-10-CM | POA: Insufficient documentation

## 2020-12-15 DIAGNOSIS — Z9989 Dependence on other enabling machines and devices: Secondary | ICD-10-CM | POA: Diagnosis not present

## 2020-12-15 DIAGNOSIS — M25561 Pain in right knee: Secondary | ICD-10-CM

## 2020-12-15 DIAGNOSIS — E119 Type 2 diabetes mellitus without complications: Secondary | ICD-10-CM | POA: Diagnosis not present

## 2020-12-15 DIAGNOSIS — Z85810 Personal history of malignant neoplasm of tongue: Secondary | ICD-10-CM | POA: Insufficient documentation

## 2020-12-15 DIAGNOSIS — Z7984 Long term (current) use of oral hypoglycemic drugs: Secondary | ICD-10-CM | POA: Insufficient documentation

## 2020-12-15 DIAGNOSIS — I440 Atrioventricular block, first degree: Secondary | ICD-10-CM

## 2020-12-15 DIAGNOSIS — R0602 Shortness of breath: Secondary | ICD-10-CM | POA: Diagnosis present

## 2020-12-15 DIAGNOSIS — F319 Bipolar disorder, unspecified: Secondary | ICD-10-CM

## 2020-12-15 DIAGNOSIS — G8929 Other chronic pain: Secondary | ICD-10-CM

## 2020-12-15 DIAGNOSIS — I5030 Unspecified diastolic (congestive) heart failure: Secondary | ICD-10-CM

## 2020-12-15 LAB — BASIC METABOLIC PANEL
Anion gap: 8 (ref 5–15)
BUN: 15 mg/dL (ref 6–20)
CO2: 25 mmol/L (ref 22–32)
Calcium: 9.4 mg/dL (ref 8.9–10.3)
Chloride: 106 mmol/L (ref 98–111)
Creatinine, Ser: 0.73 mg/dL (ref 0.44–1.00)
GFR, Estimated: >60 mL/min (ref >60)
Glucose, Bld: 143 mg/dL — ABNORMAL HIGH (ref 70–99)
Potassium: 3.9 mmol/L (ref 3.5–5.1)
Sodium: 139 mmol/L (ref 135–145)

## 2020-12-15 LAB — CBC
HCT: 37 % (ref 36.0–46.0)
Hemoglobin: 12.3 g/dL (ref 12.0–15.0)
MCH: 32.7 pg (ref 26.0–34.0)
MCHC: 33.2 g/dL (ref 30.0–36.0)
MCV: 98.4 fL (ref 80.0–100.0)
Platelets: 222 10*3/uL (ref 150–400)
RBC: 3.76 MIL/uL — ABNORMAL LOW (ref 3.87–5.11)
RDW: 15.9 % — ABNORMAL HIGH (ref 11.5–15.5)
WBC: 10.5 10*3/uL (ref 4.0–10.5)
nRBC: 0 % (ref 0.0–0.2)

## 2020-12-15 LAB — SARS CORONAVIRUS 2 (TAT 6-24 HRS): SARS Coronavirus 2: NEGATIVE

## 2020-12-15 LAB — I-STAT BETA HCG BLOOD, ED (MC, WL, AP ONLY): I-stat hCG, quantitative: <5 m[IU]/mL (ref <5)

## 2020-12-15 LAB — BRAIN NATRIURETIC PEPTIDE: B Natriuretic Peptide: 19.9 pg/mL (ref 0.0–100.0)

## 2020-12-15 LAB — GLUCOSE, CAPILLARY: Glucose-Capillary: 194 mg/dL — ABNORMAL HIGH (ref 70–99)

## 2020-12-15 LAB — TROPONIN I (HIGH SENSITIVITY)
Troponin I (High Sensitivity): 6 ng/L (ref <18)
Troponin I (High Sensitivity): 6 ng/L (ref <18)

## 2020-12-15 MED ORDER — ALBUTEROL SULFATE (2.5 MG/3ML) 0.083% IN NEBU
5.0000 mg | INHALATION_SOLUTION | Freq: Once | RESPIRATORY_TRACT | Status: AC
  Administered 2020-12-15: 5 mg via RESPIRATORY_TRACT
  Filled 2020-12-15: qty 6

## 2020-12-15 MED ORDER — GABAPENTIN 300 MG PO CAPS
300.0000 mg | ORAL_CAPSULE | Freq: Three times a day (TID) | ORAL | Status: DC
  Administered 2020-12-15 – 2020-12-16 (×3): 300 mg via ORAL
  Filled 2020-12-15 (×3): qty 1

## 2020-12-15 MED ORDER — FLUTICASONE FUROATE-VILANTEROL 100-25 MCG/INH IN AEPB
1.0000 | INHALATION_SPRAY | Freq: Every day | RESPIRATORY_TRACT | Status: DC
  Administered 2020-12-15: 1 via RESPIRATORY_TRACT
  Filled 2020-12-15: qty 28

## 2020-12-15 MED ORDER — PANTOPRAZOLE SODIUM 40 MG PO TBEC
40.0000 mg | DELAYED_RELEASE_TABLET | Freq: Every day | ORAL | Status: DC
  Administered 2020-12-15 – 2020-12-16 (×2): 40 mg via ORAL
  Filled 2020-12-15 (×2): qty 1

## 2020-12-15 MED ORDER — DULAGLUTIDE 0.75 MG/0.5ML ~~LOC~~ SOAJ: 0.7500 mg | SUBCUTANEOUS | Status: DC

## 2020-12-15 MED ORDER — ENOXAPARIN SODIUM 60 MG/0.6ML ~~LOC~~ SOLN
60.0000 mg | SUBCUTANEOUS | Status: DC
  Administered 2020-12-15: 60 mg via SUBCUTANEOUS
  Filled 2020-12-15: qty 0.6

## 2020-12-15 MED ORDER — IPRATROPIUM-ALBUTEROL 0.5-2.5 (3) MG/3ML IN SOLN
3.0000 mL | Freq: Once | RESPIRATORY_TRACT | Status: AC
  Administered 2020-12-15: 3 mL via RESPIRATORY_TRACT
  Filled 2020-12-15: qty 3

## 2020-12-15 MED ORDER — IPRATROPIUM BROMIDE 0.02 % IN SOLN
0.5000 mg | Freq: Once | RESPIRATORY_TRACT | Status: AC
  Administered 2020-12-15: 0.5 mg via RESPIRATORY_TRACT
  Filled 2020-12-15: qty 2.5

## 2020-12-15 MED ORDER — ACETAMINOPHEN 650 MG RE SUPP: 650.0000 mg | Freq: Four times a day (QID) | RECTAL | Status: DC | PRN

## 2020-12-15 MED ORDER — POLYETHYLENE GLYCOL 3350 17 G PO PACK: 17.0000 g | PACK | Freq: Every day | ORAL | Status: DC | PRN

## 2020-12-15 MED ORDER — TOPIRAMATE 25 MG PO TABS
50.0000 mg | ORAL_TABLET | Freq: Every day | ORAL | Status: DC
  Administered 2020-12-15 – 2020-12-16 (×2): 50 mg via ORAL
  Filled 2020-12-15 (×2): qty 2

## 2020-12-15 MED ORDER — MAGNESIUM SULFATE 2 GM/50ML IV SOLN
2.0000 g | Freq: Once | INTRAVENOUS | Status: AC
  Administered 2020-12-15: 2 g via INTRAVENOUS
  Filled 2020-12-15: qty 50

## 2020-12-15 MED ORDER — ARIPIPRAZOLE 10 MG PO TABS
20.0000 mg | ORAL_TABLET | Freq: Every day | ORAL | Status: DC
  Administered 2020-12-15: 20 mg via ORAL
  Filled 2020-12-15: qty 2

## 2020-12-15 MED ORDER — UMECLIDINIUM BROMIDE 62.5 MCG/INH IN AEPB
1.0000 | INHALATION_SPRAY | Freq: Every day | RESPIRATORY_TRACT | Status: DC
  Administered 2020-12-15: 1 via RESPIRATORY_TRACT
  Filled 2020-12-15: qty 7

## 2020-12-15 MED ORDER — IRBESARTAN 75 MG PO TABS
75.0000 mg | ORAL_TABLET | Freq: Every day | ORAL | Status: DC
  Administered 2020-12-15 – 2020-12-16 (×2): 75 mg via ORAL
  Filled 2020-12-15 (×2): qty 1

## 2020-12-15 MED ORDER — GLIPIZIDE ER 10 MG PO TB24
10.0000 mg | ORAL_TABLET | Freq: Every day | ORAL | Status: DC
  Administered 2020-12-16: 10 mg via ORAL
  Filled 2020-12-15: qty 1

## 2020-12-15 MED ORDER — FESOTERODINE FUMARATE ER 4 MG PO TB24
4.0000 mg | ORAL_TABLET | Freq: Every day | ORAL | Status: DC
  Administered 2020-12-15 – 2020-12-16 (×2): 4 mg via ORAL
  Filled 2020-12-15 (×2): qty 1

## 2020-12-15 MED ORDER — METFORMIN HCL 500 MG PO TABS
1000.0000 mg | ORAL_TABLET | Freq: Two times a day (BID) | ORAL | Status: DC
  Administered 2020-12-15 – 2020-12-16 (×2): 1000 mg via ORAL
  Filled 2020-12-15 (×2): qty 2

## 2020-12-15 MED ORDER — DULOXETINE HCL 30 MG PO CPEP
30.0000 mg | ORAL_CAPSULE | Freq: Two times a day (BID) | ORAL | Status: DC
  Administered 2020-12-15 – 2020-12-16 (×3): 30 mg via ORAL
  Filled 2020-12-15 (×4): qty 1

## 2020-12-15 MED ORDER — TRAZODONE HCL 50 MG PO TABS
50.0000 mg | ORAL_TABLET | Freq: Every day | ORAL | Status: DC
  Administered 2020-12-15: 50 mg via ORAL
  Filled 2020-12-15: qty 1

## 2020-12-15 MED ORDER — MONTELUKAST SODIUM 10 MG PO TABS
10.0000 mg | ORAL_TABLET | Freq: Every day | ORAL | Status: DC
  Administered 2020-12-15: 10 mg via ORAL
  Filled 2020-12-15: qty 1

## 2020-12-15 MED ORDER — IPRATROPIUM-ALBUTEROL 0.5-2.5 (3) MG/3ML IN SOLN: 3.0000 mL | RESPIRATORY_TRACT | Status: DC | PRN

## 2020-12-15 MED ORDER — PREDNISONE 50 MG PO TABS
60.0000 mg | ORAL_TABLET | Freq: Every day | ORAL | Status: DC
  Administered 2020-12-16: 60 mg via ORAL
  Filled 2020-12-15: qty 1

## 2020-12-15 MED ORDER — METOPROLOL SUCCINATE ER 25 MG PO TB24
25.0000 mg | ORAL_TABLET | Freq: Every day | ORAL | Status: DC
  Administered 2020-12-15 – 2020-12-16 (×2): 25 mg via ORAL
  Filled 2020-12-15 (×2): qty 1

## 2020-12-15 MED ORDER — OXYCODONE HCL 5 MG PO TABS
10.0000 mg | ORAL_TABLET | Freq: Four times a day (QID) | ORAL | Status: DC | PRN
  Administered 2020-12-15 – 2020-12-16 (×3): 10 mg via ORAL
  Filled 2020-12-15 (×3): qty 2

## 2020-12-15 MED ORDER — TORSEMIDE 20 MG PO TABS
40.0000 mg | ORAL_TABLET | Freq: Two times a day (BID) | ORAL | Status: DC
  Administered 2020-12-15 – 2020-12-16 (×2): 40 mg via ORAL
  Filled 2020-12-15 (×3): qty 2

## 2020-12-15 MED ORDER — ACETAMINOPHEN 325 MG PO TABS: 650.0000 mg | ORAL_TABLET | Freq: Four times a day (QID) | ORAL | Status: DC | PRN

## 2020-12-15 MED ORDER — PREDNISONE 20 MG PO TABS
60.0000 mg | ORAL_TABLET | Freq: Once | ORAL | Status: AC
  Administered 2020-12-15: 60 mg via ORAL
  Filled 2020-12-15: qty 3

## 2020-12-15 MED ORDER — POTASSIUM CHLORIDE CRYS ER 20 MEQ PO TBCR
40.0000 meq | EXTENDED_RELEASE_TABLET | Freq: Two times a day (BID) | ORAL | Status: DC
  Administered 2020-12-15 – 2020-12-16 (×3): 40 meq via ORAL
  Filled 2020-12-15 (×3): qty 2

## 2020-12-15 NOTE — ED Notes (Signed)
Sandwich bag & ginger ale given to pt

## 2020-12-15 NOTE — ED Triage Notes (Signed)
midsternal chest pain radiating to throat with nausea, shortness of breath and lightheadedness x 2 days.

## 2020-12-15 NOTE — ED Notes (Signed)
Pt transitioned from laying down to sitting. Pt O2 dropped from 97% to 90% and respi rate was from 25 increased to 43. Pt was very short of breath and unsteady gait noted. Pt was very unsteady standing up and unable to tolerate ambulation. Respi rate went up to 45 cpm from sitting to standing. MD notified.

## 2020-12-15 NOTE — ED Notes (Signed)
Dietary called to send lunch tray.

## 2020-12-15 NOTE — ED Notes (Signed)
Patient transported to X-ray 
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
## 2020-12-16 ENCOUNTER — Encounter (HOSPITAL_COMMUNITY): Payer: Self-pay | Admitting: Internal Medicine

## 2020-12-16 DIAGNOSIS — F17210 Nicotine dependence, cigarettes, uncomplicated: Secondary | ICD-10-CM | POA: Diagnosis not present

## 2020-12-16 DIAGNOSIS — J45901 Unspecified asthma with (acute) exacerbation: Secondary | ICD-10-CM | POA: Diagnosis not present

## 2020-12-16 DIAGNOSIS — I110 Hypertensive heart disease with heart failure: Secondary | ICD-10-CM | POA: Diagnosis not present

## 2020-12-16 LAB — GLUCOSE, CAPILLARY
Glucose-Capillary: 166 mg/dL — ABNORMAL HIGH (ref 70–99)
Glucose-Capillary: 230 mg/dL — ABNORMAL HIGH (ref 70–99)

## 2020-12-16 LAB — BASIC METABOLIC PANEL
Anion gap: 10 (ref 5–15)
BUN: 15 mg/dL (ref 6–20)
CO2: 28 mmol/L (ref 22–32)
Calcium: 9.1 mg/dL (ref 8.9–10.3)
Chloride: 101 mmol/L (ref 98–111)
Creatinine, Ser: 1 mg/dL (ref 0.44–1.00)
GFR, Estimated: >60 mL/min (ref >60)
Glucose, Bld: 158 mg/dL — ABNORMAL HIGH (ref 70–99)
Potassium: 3.7 mmol/L (ref 3.5–5.1)
Sodium: 139 mmol/L (ref 135–145)

## 2020-12-16 LAB — CBC
HCT: 38.4 % (ref 36.0–46.0)
Hemoglobin: 12.8 g/dL (ref 12.0–15.0)
MCH: 32.1 pg (ref 26.0–34.0)
MCHC: 33.3 g/dL (ref 30.0–36.0)
MCV: 96.2 fL (ref 80.0–100.0)
Platelets: 240 10*3/uL (ref 150–400)
RBC: 3.99 MIL/uL (ref 3.87–5.11)
RDW: 15.7 % — ABNORMAL HIGH (ref 11.5–15.5)
WBC: 13.2 10*3/uL — ABNORMAL HIGH (ref 4.0–10.5)
nRBC: 0 % (ref 0.0–0.2)

## 2020-12-16 LAB — HEMOGLOBIN A1C
Hgb A1c MFr Bld: 6.6 % — ABNORMAL HIGH (ref 4.8–5.6)
Mean Plasma Glucose: 142.72 mg/dL

## 2020-12-16 MED ORDER — PREDNISONE 20 MG PO TABS: 60.0000 mg | ORAL_TABLET | Freq: Every day | ORAL | 0 refills | Status: AC

## 2020-12-16 NOTE — Discharge Instructions (Addendum)
Lori Bean,  It was a pleasure taking care of you. You were admitted to the hospital for an asthma exacerbation (attack). You were treated with nebulizers and oral steroids. I have prescribed you an additional 5 days of 60 mg prednisone which you will start tomorrow, 12/17/20 with last dose on 12/21/20. It is extremely important that you work on cutting back and eventually quitting smoking altogether. If you haven't already, you can call the tobacco quit line 1-800-QUIT-NOW (731)346-5290) for more information.  I would like you to schedule an appointment with your PCP in the next 3-5 days. Your PCP may have you follow-up with your pulmonologist and cardiologist. Please clarify the dose of your fluid pill. According to your records, you should be taking torsemide 40 mg twice daily (not furosemide/Lasix). Also, you were previously instructed to stop taking Maxzide (triamterene-HCTZ).   Take care!

## 2020-12-16 NOTE — Progress Notes (Signed)
Arrived to 6N03, alert and oriented x4, able to make needs known. No acute distress noted. No SOB/DOB noted. 95%room air. 10/10 c/o back pain- medicated PRN. V/S stable. Patient was oriented to call bell and surroundings. Will continue to close monitor.

## 2020-12-16 NOTE — Progress Notes (Signed)
Discharge instructions provided to patient, patient verbalizes understanding. Waited to speak to Education officer, museum. Patient discharged after.

## 2020-12-16 NOTE — Progress Notes (Signed)
Inpatient Diabetes Program Recommendations  AACE/ADA: New Consensus Statement on Inpatient Glycemic Control (2015)  Target Ranges:  Prepandial:   less than 140 mg/dL      Peak postprandial:   less than 180 mg/dL (1-2 hours)      Critically ill patients:  140 - 180 mg/dL   Lab Results  Component Value Date   GLUCAP 230 (H) 12/16/2020   HGBA1C 6.6 (H) 12/16/2020    Review of Glycemic Control  Diabetes history: DM2 Outpatient Diabetes medications: Trulicity 2.39 mg weekly, glipizide 10 mg QAM, metformin 1000 mg BID Current orders for Inpatient glycemic control: Trulicity 5.32 mg weekly, glipizide 10 mg QAM, metformin 1000 mg BID.  HgbA1C - 6.6% - good control at home.  Inpatient Diabetes Program Recommendations:     Consider addition of Novolog 0-9 units TID with meals.  Will continue to follow.   Thank you. Lorenda Peck, RD, LDN, CDE Inpatient Diabetes Coordinator [phone]
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
## 2020-12-16 NOTE — Social Work (Signed)
CSW gave pt information about [facility].   Emeterio Reeve, Latanya Presser, [place] Social Worker [phone]

## 2020-12-18 ENCOUNTER — Encounter: Payer: Self-pay | Admitting: Pulmonary Disease

## 2020-12-18 ENCOUNTER — Other Ambulatory Visit: Payer: Self-pay

## 2020-12-18 ENCOUNTER — Ambulatory Visit (INDEPENDENT_AMBULATORY_CARE_PROVIDER_SITE_OTHER): Payer: Medicaid Other | Admitting: Pulmonary Disease

## 2020-12-18 DIAGNOSIS — J441 Chronic obstructive pulmonary disease with (acute) exacerbation: Secondary | ICD-10-CM

## 2020-12-18 DIAGNOSIS — G4733 Obstructive sleep apnea (adult) (pediatric): Secondary | ICD-10-CM | POA: Diagnosis not present

## 2020-12-18 DIAGNOSIS — J45901 Unspecified asthma with (acute) exacerbation: Secondary | ICD-10-CM

## 2020-12-18 MED ORDER — BREZTRI AEROSPHERE 160-9-4.8 MCG/ACT IN AERO: 2.0000 | INHALATION_SPRAY | Freq: Two times a day (BID) | RESPIRATORY_TRACT | 6 refills | Status: DC

## 2020-12-18 NOTE — Addendum Note (Signed)
Addended by: Birder Robson on: 12/18/2020 09:42 AM   Modules accepted: Orders

## 2020-12-18 NOTE — Patient Instructions (Signed)
BiPAP machine is working well on current settings. You have to quit smoking. Start Breztri 2 puffs twice daily, rinse mouth after use

## 2020-12-18 NOTE — Assessment & Plan Note (Addendum)
BiPAP download was reviewed which shows excellent control of events on average settings 11/8 cm, auto BiPAP settings with minimal leak.  She has excellent compliance. She has settled down with using the machine. Supplies will be renewed for a year.  Weight loss encouraged, compliance with goal of at least 4-6 hrs every night is the expectation. Advised against medications with sedative side effects Cautioned against driving when sleepy - understanding that sleepiness will vary on a day to day basis She does not seem to have obesity hypoventilation as verified by previous VBG

## 2020-12-18 NOTE — Assessment & Plan Note (Signed)
Smoking cessation was emphasized.  She will complete her course of prednisone for 5 days for bronchospasm appears to have resolved. Continue Breztri twice daily

## 2020-12-18 NOTE — Progress Notes (Signed)
   Subjective:    Patient ID: Lori Bean, female    DOB: 09-21-1970, 50 y.o.   MRN: [id number]  HPI  50 yo smoker for FU of chronic asthma & obstructive sleep apnea. PMH -  hypertensive heart disease and chronic diastolic heart failure.She also has bipolar disorder and schizophrenia  The office visit with me was 09/2018, weight then was 255 pounds. She is currently at 265.  She is very compliant with her BiPAP machine, used to complain of high pressure but once we changed her auto BiPAP settings this is worked very well for her.  Does not miss a single night. She was hospitalized 4/3 for asthma exacerbation, reviewed discharge summary, seem to have been exacerbated by grill smoke.  She continues to smoke a pack per day but has not smoked again since discharge.  Her diuretics were clarified as torsemide 40 mg twice daily Chest x-ray independently reviewed, does not show any infiltrates  Labs showed normal electrolytes, BNP was 20 VBG 08/2019 showed 7.3 6/33/82  Significant tests/ events reviewed NPSG 06/2016  moderate OSA with AHI of 26/hour with lowest desaturation of 87%. This was corrected by CPAP of 14 cm with a small fullface mask.  07/2017 titration >> Bipap 16/12  07/20/2018 PFTs - FVC 1.77 (65%), FEV1 1.33 (60%), RATIO 75, DLCOcor 17.01 (78%)   07/04/18 Spirometry- FVC 1.6 (59%), FEV1 1.1 (50%), ratio 69  10/28/2017 Spirometry-FEV1 1.01 (45%) Ratio 67   Review of Systems neg for any significant sore throat, dysphagia, itching, sneezing, nasal congestion or excess/ purulent secretions, fever, chills, sweats, unintended wt loss, pleuritic or exertional cp, hempoptysis, orthopnea pnd or change in chronic leg swelling. Also denies presyncope, palpitations, heartburn, abdominal pain, nausea, vomiting, diarrhea or change in bowel or urinary habits, dysuria,hematuria, rash, arthralgias, visual complaints, headache, numbness weakness or ataxia.     Objective:   Physical  Exam  Gen. Pleasant, obese, in no distress ENT - no lesions, no post nasal drip Neck: No JVD, no thyromegaly, no carotid bruits Lungs: no use of accessory muscles, no dullness to percussion, decreased without rales or rhonchi  Cardiovascular: Rhythm regular, heart sounds  normal, no murmurs or gallops, no peripheral edema Musculoskeletal: No deformities, no cyanosis or clubbing , no tremors       Assessment & Plan:

## 2021-01-01 ENCOUNTER — Telehealth: Payer: Self-pay | Admitting: Pulmonary Disease

## 2021-01-01 NOTE — Telephone Encounter (Signed)
PA request was received from (pharmacy): [facility] Fax: [phone] Medication name and strength: Brextri  Ordering Provider: RA  PA completed via [place] [facility]  Dates 01/01/21 -12/27/21 Notified pharmacy

## 2021-01-11 IMAGING — DX PORTABLE CHEST - 1 VIEW
1 series · 1 of 1 positions shown · non-contrast
Comparison: 09/20/2018

CLINICAL DATA: Cough, asthma, shortness of breath for 3 days

EXAM:
PORTABLE CHEST 1 VIEW

[chest]
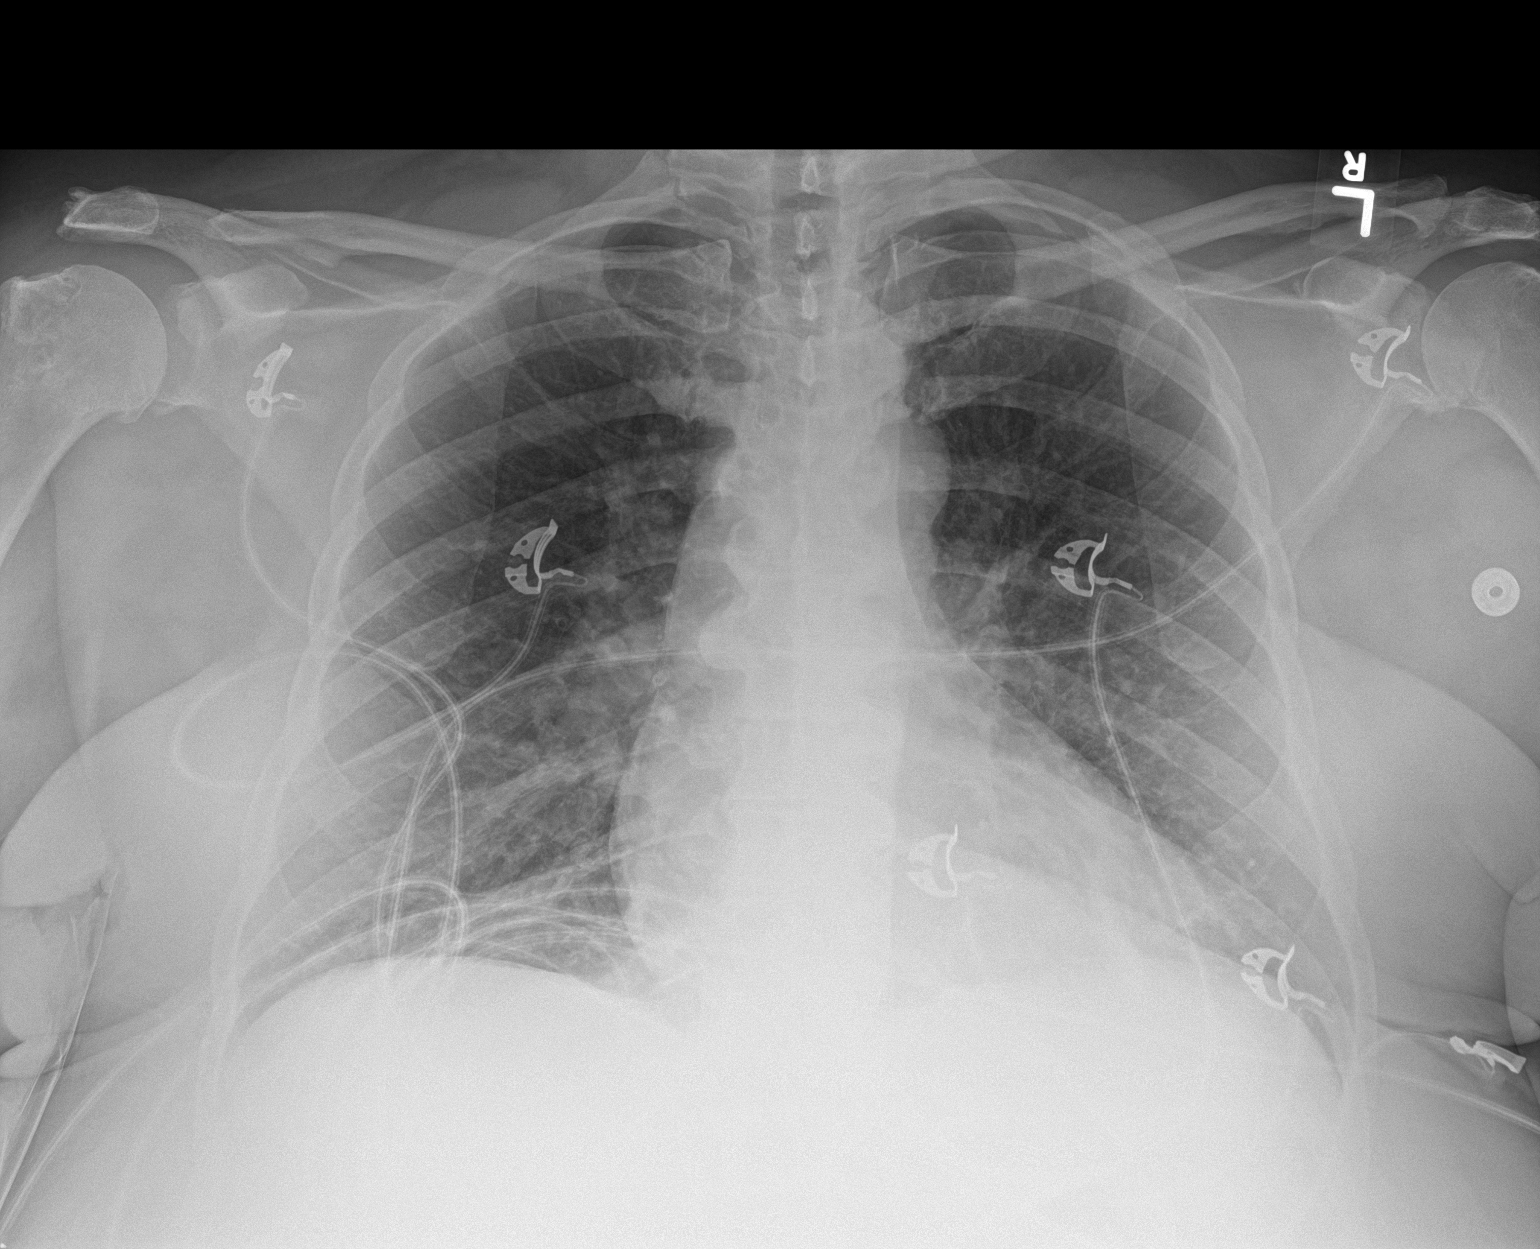

[1 of 1 positions shown; findings below may reference images not displayed]

FINDINGS: There is hazy left lower lobe airspace disease likely reflecting
atelectasis. There is no focal consolidation. There is no pleural
effusion or pneumothorax. The heart and mediastinal contours are
unremarkable.

The osseous structures are unremarkable.
IMPRESSION: No active disease.

## 2021-03-12 ENCOUNTER — Encounter (HOSPITAL_COMMUNITY): Payer: Self-pay

## 2021-03-12 ENCOUNTER — Inpatient Hospital Stay (HOSPITAL_COMMUNITY)
Admission: EM | Admit: 2021-03-12 | Discharge: 2021-03-14 | DRG: 190 | Disposition: A | Discharge status: Home / Self Care | Payer: Medicaid Other | Attending: Student in an Organized Health Care Education/Training Program | Admitting: Student in an Organized Health Care Education/Training Program

## 2021-03-12 ENCOUNTER — Ambulatory Visit (HOSPITAL_COMMUNITY)
Admission: EM | Admit: 2021-03-12 | Discharge: 2021-03-12 | Disposition: A | Discharge status: Home / Self Care | Payer: Medicaid Other | Attending: Medical Oncology | Admitting: Medical Oncology

## 2021-03-12 ENCOUNTER — Emergency Department (HOSPITAL_COMMUNITY): Payer: Medicaid Other

## 2021-03-12 DIAGNOSIS — M5430 Sciatica, unspecified side: Secondary | ICD-10-CM | POA: Diagnosis present

## 2021-03-12 DIAGNOSIS — R0602 Shortness of breath: Secondary | ICD-10-CM | POA: Diagnosis not present

## 2021-03-12 DIAGNOSIS — Z8249 Family history of ischemic heart disease and other diseases of the circulatory system: Secondary | ICD-10-CM

## 2021-03-12 DIAGNOSIS — Z6841 Body Mass Index (BMI) 40.0 and over, adult: Secondary | ICD-10-CM | POA: Diagnosis not present

## 2021-03-12 DIAGNOSIS — R079 Chest pain, unspecified: Secondary | ICD-10-CM

## 2021-03-12 DIAGNOSIS — K219 Gastro-esophageal reflux disease without esophagitis: Secondary | ICD-10-CM | POA: Diagnosis present

## 2021-03-12 DIAGNOSIS — N3281 Overactive bladder: Secondary | ICD-10-CM | POA: Diagnosis present

## 2021-03-12 DIAGNOSIS — T59811A Toxic effect of smoke, accidental (unintentional), initial encounter: Secondary | ICD-10-CM | POA: Diagnosis present

## 2021-03-12 DIAGNOSIS — J9620 Acute and chronic respiratory failure, unspecified whether with hypoxia or hypercapnia: Secondary | ICD-10-CM | POA: Diagnosis present

## 2021-03-12 DIAGNOSIS — J4531 Mild persistent asthma with (acute) exacerbation: Secondary | ICD-10-CM | POA: Diagnosis present

## 2021-03-12 DIAGNOSIS — J45901 Unspecified asthma with (acute) exacerbation: Secondary | ICD-10-CM | POA: Diagnosis present

## 2021-03-12 DIAGNOSIS — K760 Fatty (change of) liver, not elsewhere classified: Secondary | ICD-10-CM | POA: Diagnosis present

## 2021-03-12 DIAGNOSIS — Z809 Family history of malignant neoplasm, unspecified: Secondary | ICD-10-CM

## 2021-03-12 DIAGNOSIS — J449 Chronic obstructive pulmonary disease, unspecified: Secondary | ICD-10-CM | POA: Diagnosis present

## 2021-03-12 DIAGNOSIS — Z833 Family history of diabetes mellitus: Secondary | ICD-10-CM | POA: Diagnosis not present

## 2021-03-12 DIAGNOSIS — Z886 Allergy status to analgesic agent status: Secondary | ICD-10-CM

## 2021-03-12 DIAGNOSIS — J441 Chronic obstructive pulmonary disease with (acute) exacerbation: Secondary | ICD-10-CM | POA: Diagnosis present

## 2021-03-12 DIAGNOSIS — E662 Morbid (severe) obesity with alveolar hypoventilation: Secondary | ICD-10-CM | POA: Diagnosis present

## 2021-03-12 DIAGNOSIS — E875 Hyperkalemia: Secondary | ICD-10-CM | POA: Diagnosis present

## 2021-03-12 DIAGNOSIS — E1140 Type 2 diabetes mellitus with diabetic neuropathy, unspecified: Secondary | ICD-10-CM | POA: Diagnosis present

## 2021-03-12 DIAGNOSIS — Z79899 Other long term (current) drug therapy: Secondary | ICD-10-CM | POA: Diagnosis not present

## 2021-03-12 DIAGNOSIS — I5032 Chronic diastolic (congestive) heart failure: Secondary | ICD-10-CM | POA: Diagnosis present

## 2021-03-12 DIAGNOSIS — M351 Other overlap syndromes: Secondary | ICD-10-CM | POA: Diagnosis present

## 2021-03-12 DIAGNOSIS — E1169 Type 2 diabetes mellitus with other specified complication: Secondary | ICD-10-CM | POA: Diagnosis present

## 2021-03-12 DIAGNOSIS — J4541 Moderate persistent asthma with (acute) exacerbation: Secondary | ICD-10-CM | POA: Diagnosis present

## 2021-03-12 DIAGNOSIS — I110 Hypertensive heart disease with heart failure: Secondary | ICD-10-CM | POA: Diagnosis present

## 2021-03-12 DIAGNOSIS — E119 Type 2 diabetes mellitus without complications: Secondary | ICD-10-CM | POA: Diagnosis present

## 2021-03-12 DIAGNOSIS — Z20822 Contact with and (suspected) exposure to covid-19: Secondary | ICD-10-CM | POA: Diagnosis present

## 2021-03-12 DIAGNOSIS — Z7984 Long term (current) use of oral hypoglycemic drugs: Secondary | ICD-10-CM

## 2021-03-12 DIAGNOSIS — M1990 Unspecified osteoarthritis, unspecified site: Secondary | ICD-10-CM | POA: Diagnosis present

## 2021-03-12 DIAGNOSIS — F319 Bipolar disorder, unspecified: Secondary | ICD-10-CM | POA: Diagnosis present

## 2021-03-12 DIAGNOSIS — G4733 Obstructive sleep apnea (adult) (pediatric): Secondary | ICD-10-CM | POA: Diagnosis present

## 2021-03-12 DIAGNOSIS — F17210 Nicotine dependence, cigarettes, uncomplicated: Secondary | ICD-10-CM | POA: Diagnosis present

## 2021-03-12 DIAGNOSIS — F209 Schizophrenia, unspecified: Secondary | ICD-10-CM | POA: Diagnosis present

## 2021-03-12 DIAGNOSIS — Z86718 Personal history of other venous thrombosis and embolism: Secondary | ICD-10-CM

## 2021-03-12 DIAGNOSIS — J9811 Atelectasis: Secondary | ICD-10-CM | POA: Diagnosis present

## 2021-03-12 DIAGNOSIS — F419 Anxiety disorder, unspecified: Secondary | ICD-10-CM | POA: Diagnosis present

## 2021-03-12 LAB — CBC WITH DIFFERENTIAL/PLATELET
Abs Immature Granulocytes: 0.07 10*3/uL (ref 0.00–0.07)
Basophils Absolute: 0.1 10*3/uL (ref 0.0–0.1)
Basophils Relative: 0 %
Eosinophils Absolute: 0.2 10*3/uL (ref 0.0–0.5)
Eosinophils Relative: 2 %
HCT: 40.7 % (ref 36.0–46.0)
Hemoglobin: 13.4 g/dL (ref 12.0–15.0)
Immature Granulocytes: 1 %
Lymphocytes Relative: 29 %
Lymphs Abs: 3.4 10*3/uL (ref 0.7–4.0)
MCH: 32 pg (ref 26.0–34.0)
MCHC: 32.9 g/dL (ref 30.0–36.0)
MCV: 97.1 fL (ref 80.0–100.0)
Monocytes Absolute: 1 10*3/uL (ref 0.1–1.0)
Monocytes Relative: 8 %
Neutro Abs: 7.1 10*3/uL (ref 1.7–7.7)
Neutrophils Relative %: 60 %
Platelets: 227 10*3/uL (ref 150–400)
RBC: 4.19 MIL/uL (ref 3.87–5.11)
RDW: 14.3 % (ref 11.5–15.5)
WBC: 11.7 10*3/uL — ABNORMAL HIGH (ref 4.0–10.5)
nRBC: 0 % (ref 0.0–0.2)

## 2021-03-12 LAB — BASIC METABOLIC PANEL
Anion gap: 9 (ref 5–15)
BUN: 14 mg/dL (ref 6–20)
CO2: 24 mmol/L (ref 22–32)
Calcium: 9.7 mg/dL (ref 8.9–10.3)
Chloride: 102 mmol/L (ref 98–111)
Creatinine, Ser: 0.76 mg/dL (ref 0.44–1.00)
GFR, Estimated: >60 mL/min (ref >60)
Glucose, Bld: 108 mg/dL — ABNORMAL HIGH (ref 70–99)
Potassium: 5.9 mmol/L — ABNORMAL HIGH (ref 3.5–5.1)
Sodium: 135 mmol/L (ref 135–145)

## 2021-03-12 LAB — RESP PANEL BY RT-PCR (FLU A&B, COVID) ARPGX2
Influenza A by PCR: NEGATIVE
Influenza B by PCR: NEGATIVE
SARS Coronavirus 2 by RT PCR: NEGATIVE

## 2021-03-12 LAB — CBG MONITORING, ED: Glucose-Capillary: 202 mg/dL — ABNORMAL HIGH (ref 70–99)

## 2021-03-12 LAB — POTASSIUM: Potassium: 4 mmol/L (ref 3.5–5.1)

## 2021-03-12 LAB — TROPONIN I (HIGH SENSITIVITY): Troponin I (High Sensitivity): 4 ng/L (ref <18)

## 2021-03-12 LAB — BRAIN NATRIURETIC PEPTIDE: B Natriuretic Peptide: 17.5 pg/mL (ref 0.0–100.0)

## 2021-03-12 MED ORDER — INSULIN ASPART 100 UNIT/ML IJ SOLN
0.0000 [IU] | Freq: Every day | INTRAMUSCULAR | Status: DC
  Administered 2021-03-12 – 2021-03-13 (×2): 2 [IU] via SUBCUTANEOUS

## 2021-03-12 MED ORDER — FLUTICASONE FUROATE-VILANTEROL 200-25 MCG/INH IN AEPB
1.0000 | INHALATION_SPRAY | Freq: Every day | RESPIRATORY_TRACT | Status: DC
  Administered 2021-03-13: 1 via RESPIRATORY_TRACT
  Filled 2021-03-12: qty 28

## 2021-03-12 MED ORDER — ENOXAPARIN SODIUM 60 MG/0.6ML IJ SOSY
60.0000 mg | PREFILLED_SYRINGE | Freq: Every day | INTRAMUSCULAR | Status: DC
  Administered 2021-03-12 – 2021-03-13 (×2): 60 mg via SUBCUTANEOUS
  Filled 2021-03-12 (×2): qty 0.6

## 2021-03-12 MED ORDER — UMECLIDINIUM BROMIDE 62.5 MCG/INH IN AEPB
1.0000 | INHALATION_SPRAY | Freq: Every day | RESPIRATORY_TRACT | Status: DC
  Administered 2021-03-13 – 2021-03-14 (×2): 1 via RESPIRATORY_TRACT
  Filled 2021-03-12: qty 7

## 2021-03-12 MED ORDER — PREDNISONE 50 MG PO TABS
60.0000 mg | ORAL_TABLET | Freq: Every day | ORAL | Status: DC
  Administered 2021-03-13 – 2021-03-14 (×2): 60 mg via ORAL
  Filled 2021-03-12 (×2): qty 1

## 2021-03-12 MED ORDER — LIDOCAINE 5 % EX PTCH
1.0000 | MEDICATED_PATCH | CUTANEOUS | Status: DC
  Administered 2021-03-12 – 2021-03-13 (×2): 1 via TRANSDERMAL
  Filled 2021-03-12 (×2): qty 1

## 2021-03-12 MED ORDER — OXYCODONE HCL 5 MG PO TABS
5.0000 mg | ORAL_TABLET | Freq: Four times a day (QID) | ORAL | Status: DC | PRN
Start: 2021-03-12 — End: 2021-03-13
  Administered 2021-03-13 (×2): 5 mg via ORAL
  Filled 2021-03-12 (×2): qty 1

## 2021-03-12 MED ORDER — TORSEMIDE 20 MG PO TABS
40.0000 mg | ORAL_TABLET | Freq: Two times a day (BID) | ORAL | Status: DC
  Administered 2021-03-13 – 2021-03-14 (×3): 40 mg via ORAL
  Filled 2021-03-12 (×3): qty 2

## 2021-03-12 MED ORDER — FUROSEMIDE 10 MG/ML IJ SOLN
20.0000 mg | Freq: Once | INTRAMUSCULAR | Status: AC
  Administered 2021-03-12: 20 mg via INTRAVENOUS
  Filled 2021-03-12: qty 2

## 2021-03-12 MED ORDER — ACETAMINOPHEN 325 MG PO TABS: 650.0000 mg | ORAL_TABLET | Freq: Four times a day (QID) | ORAL | Status: DC | PRN

## 2021-03-12 MED ORDER — AZITHROMYCIN 250 MG PO TABS: 250.0000 mg | ORAL_TABLET | Freq: Every day | ORAL | Status: DC

## 2021-03-12 MED ORDER — IPRATROPIUM-ALBUTEROL 0.5-2.5 (3) MG/3ML IN SOLN
3.0000 mL | RESPIRATORY_TRACT | Status: AC
  Administered 2021-03-12 (×3): 3 mL via RESPIRATORY_TRACT
  Filled 2021-03-12: qty 9

## 2021-03-12 MED ORDER — ACETAMINOPHEN 650 MG RE SUPP
650.0000 mg | Freq: Four times a day (QID) | RECTAL | Status: DC | PRN
Start: 2021-03-12 — End: 2021-03-14

## 2021-03-12 MED ORDER — ONDANSETRON HCL 4 MG/2ML IJ SOLN: 4.0000 mg | Freq: Four times a day (QID) | INTRAMUSCULAR | Status: DC | PRN

## 2021-03-12 MED ORDER — ONDANSETRON HCL 4 MG PO TABS: 4.0000 mg | ORAL_TABLET | Freq: Four times a day (QID) | ORAL | Status: DC | PRN

## 2021-03-12 MED ORDER — AZITHROMYCIN 250 MG PO TABS: 500.0000 mg | ORAL_TABLET | Freq: Every day | ORAL | Status: DC

## 2021-03-12 MED ORDER — INSULIN ASPART 100 UNIT/ML IJ SOLN
0.0000 [IU] | Freq: Three times a day (TID) | INTRAMUSCULAR | Status: DC
Start: 2021-03-13 — End: 2021-03-14
  Administered 2021-03-13: 7 [IU] via SUBCUTANEOUS
  Administered 2021-03-13: 11 [IU] via SUBCUTANEOUS
  Administered 2021-03-13 – 2021-03-14 (×3): 4 [IU] via SUBCUTANEOUS

## 2021-03-12 MED ORDER — MONTELUKAST SODIUM 10 MG PO TABS
10.0000 mg | ORAL_TABLET | Freq: Every day | ORAL | Status: DC
  Administered 2021-03-12 – 2021-03-13 (×2): 10 mg via ORAL
  Filled 2021-03-12 (×2): qty 1

## 2021-03-12 MED ORDER — NICOTINE 21 MG/24HR TD PT24
21.0000 mg | MEDICATED_PATCH | Freq: Every day | TRANSDERMAL | Status: DC
  Administered 2021-03-12 – 2021-03-14 (×3): 21 mg via TRANSDERMAL
  Filled 2021-03-12 (×3): qty 1

## 2021-03-12 MED ORDER — BUDESON-GLYCOPYRROL-FORMOTEROL 160-9-4.8 MCG/ACT IN AERO: 2.0000 | INHALATION_SPRAY | Freq: Two times a day (BID) | RESPIRATORY_TRACT | Status: DC

## 2021-03-12 MED ORDER — PHENTERMINE HCL 37.5 MG PO TABS: 37.5000 mg | ORAL_TABLET | Freq: Every day | ORAL | Status: DC

## 2021-03-12 MED ORDER — MAGNESIUM SULFATE 2 GM/50ML IV SOLN
2.0000 g | Freq: Once | INTRAVENOUS | Status: AC
  Administered 2021-03-12: 2 g via INTRAVENOUS
  Filled 2021-03-12: qty 50

## 2021-03-12 MED ORDER — GABAPENTIN 300 MG PO CAPS
300.0000 mg | ORAL_CAPSULE | Freq: Three times a day (TID) | ORAL | Status: DC
  Administered 2021-03-12 – 2021-03-14 (×5): 300 mg via ORAL
  Filled 2021-03-12 (×5): qty 1

## 2021-03-12 MED ORDER — TOPIRAMATE 25 MG PO TABS
50.0000 mg | ORAL_TABLET | Freq: Every day | ORAL | Status: DC
  Administered 2021-03-13 – 2021-03-14 (×2): 50 mg via ORAL
  Filled 2021-03-12 (×2): qty 2

## 2021-03-12 MED ORDER — DULOXETINE HCL 30 MG PO CPEP
30.0000 mg | ORAL_CAPSULE | Freq: Two times a day (BID) | ORAL | Status: DC
  Administered 2021-03-12 – 2021-03-14 (×4): 30 mg via ORAL
  Filled 2021-03-12 (×4): qty 1

## 2021-03-12 MED ORDER — AZITHROMYCIN 250 MG PO TABS
250.0000 mg | ORAL_TABLET | Freq: Every day | ORAL | Status: DC
  Administered 2021-03-14: 250 mg via ORAL
  Filled 2021-03-12: qty 1

## 2021-03-12 MED ORDER — AZITHROMYCIN 250 MG PO TABS
500.0000 mg | ORAL_TABLET | Freq: Every day | ORAL | Status: AC
  Administered 2021-03-13: 500 mg via ORAL
  Filled 2021-03-12: qty 2

## 2021-03-12 MED ORDER — IPRATROPIUM-ALBUTEROL 0.5-2.5 (3) MG/3ML IN SOLN: 3.0000 mL | RESPIRATORY_TRACT | Status: DC | PRN

## 2021-03-12 MED ORDER — ALBUTEROL (5 MG/ML) CONTINUOUS INHALATION SOLN
10.0000 mg/h | INHALATION_SOLUTION | RESPIRATORY_TRACT | Status: DC
  Administered 2021-03-12: 10 mg/h via RESPIRATORY_TRACT
  Filled 2021-03-12: qty 20

## 2021-03-12 MED ORDER — FESOTERODINE FUMARATE ER 4 MG PO TB24
4.0000 mg | ORAL_TABLET | Freq: Every day | ORAL | Status: DC
  Administered 2021-03-13 – 2021-03-14 (×2): 4 mg via ORAL
  Filled 2021-03-12 (×2): qty 1

## 2021-03-12 MED ORDER — ARIPIPRAZOLE 5 MG PO TABS
20.0000 mg | ORAL_TABLET | Freq: Every day | ORAL | Status: DC
  Administered 2021-03-12 – 2021-03-13 (×2): 20 mg via ORAL
  Filled 2021-03-12 (×2): qty 4

## 2021-03-12 MED ORDER — METHYLPREDNISOLONE SODIUM SUCC 125 MG IJ SOLR
125.0000 mg | Freq: Once | INTRAMUSCULAR | Status: AC
  Administered 2021-03-12: 125 mg via INTRAVENOUS
  Filled 2021-03-12: qty 2

## 2021-03-12 MED ORDER — PREDNISONE 20 MG PO TABS: 40.0000 mg | ORAL_TABLET | Freq: Every day | ORAL | Status: DC

## 2021-03-12 NOTE — ED Notes (Signed)
EMS called, EMS en route via emergency traffic.

## 2021-03-12 NOTE — ED Notes (Signed)
RN informed pt about pts carb modified diet. RN informed pt the importance of this diet for her health. PT got verbally aggressive and shouted she does not care. Pts sister doordashed pt french fries,chicken nuggets and a milkshake. Md paged

## 2021-03-12 NOTE — ED Triage Notes (Signed)
Pt with c/o shob, pain/tingling and swelling in left arm. Symptoms ongoing 3 days. Endorses minor chest pain.

## 2021-03-12 NOTE — ED Provider Notes (Signed)
[facility]    CSN: [id number] Arrival date & time: 03/12/21  1724      History   Chief Complaint Chief Complaint  Patient presents with   Shortness of Breath   left arm pain   left arm swelling   Chest Pain    HPI Lori Bean is a 50 y.o. female.   HPI  Shortness of breath: Patient states that since Sunday she has had shortness of breath however symptoms worsened greatly today.  She states that she is now having some left arm pain, left arm swelling and some chest discomfort.  She states that she is having more trouble catching her breath.  She states that she has had a history of a pulmonary embolism though I am not able to see this in her history.  She does have a history of asthma and COPD which she reports has been well controlled.  She states that she has taken her asthma and COPD medications without any improvement of her symptoms.  When asked how poorly she feels today she states that she feels a 10 out of 10.  Past Medical History:  Diagnosis Date   Abdominal hernia    Anginal pain ([place])    occ from asthma   Anxiety    Arthritis    Asthma    Bipolar affective disorder ([place])    Schizophrenia   Bronchitis    hx of   CHF (congestive heart failure) ([place])    Depression    Diabetes mellitus without complication ([place])    " New Onset " per patient   GERD (gastroesophageal reflux disease)    Heart murmur    Hypertension    takes meds   Lymphedema 06/07/2018   Morbid (severe) obesity due to excess calories ([place])    Neuropathy    left leg , "from back surgery"   Overactive bladder    Schizophrenia ([place])    Sciatica    Shortness of breath    Sleep apnea    Snoring disorder    Pt stated my boyfriend always wakes me up and tells me to breathe    Patient Active Problem List   Diagnosis Date Noted   Asthma exacerbation 12/15/2020   Neck pain 10/18/2020   COPD with exacerbation ([place]) 06/29/2020   Diabetes mellitus type 2 in obese ([place])  06/28/2020   Acute on chronic respiratory failure ([place]) 09/01/2019   COPD with acute exacerbation ([place]) 08/28/2019   COPD exacerbation ([place]) 08/28/2019   Acute bronchitis 08/16/2019   Tongue cancer ([place]) 02/14/2019   COPD mixed type ([place]) 08/17/2018   Sinusitis 07/22/2018   Lymphedema 06/07/2018   Abnormal uterine bleeding (AUB) 12/12/2017   CHF (congestive heart failure) (HCC)    Chronic asthma, mild persistent, uncomplicated 75/17/0017   Dyspnea on exertion 05/05/2017   Asthma exacerbation in COPD ([place]) 05/05/2017   Morbid obesity due to excess calories ([place]) 05/05/2017   Chondromalacia, right knee 49/44/9675   Synovial plica of right knee 91/63/8466   Tear of medial meniscus of right knee, initial encounter 09/28/2016   OSA (obstructive sleep apnea) 09/23/2016   Chronic diastolic heart failure ([place]) 04/22/2016   Cigarette smoker 04/03/2014   Chest pain 04/03/2014   Lumbar spondylosis 12/28/2013   Extrinsic asthma 06/07/2013   Anxiety    Essential hypertension    Bipolar affective disorder ([place])    GERD (gastroesophageal reflux disease)    Arthritis    Schizophrenia ([place])    Overactive bladder  Past Surgical History:  Procedure Laterality Date   BACK SURGERY     3 back surgeries   CHOLECYSTECTOMY     COLONOSCOPY WITH PROPOFOL N/A 01/23/2016   Procedure: COLONOSCOPY WITH PROPOFOL;  Surgeon: Wonda Horner, MD;  Location: [facility];  Service: Endoscopy;  Laterality: N/A;   EYE SURGERY     Metal plate in right eye. Had fracture in right eye   gallstones reomved     KNEE ARTHROSCOPY WITH MENISCAL REPAIR Right 09/28/2016   Procedure: KNEE ARTHROSCOPY WITH MENISCAL REPAIR;  Surgeon: Dorna Leitz, MD;  Location: [facility];  Service: Orthopedics;  Laterality: Right;  Right partial meniscectomy and chondroplasty, patellar/femoral joint and medial femoral condyle    LUMBAR LAMINECTOMY/DECOMPRESSION MICRODISCECTOMY  04/01/2012   Procedure: LUMBAR LAMINECTOMY/DECOMPRESSION  MICRODISCECTOMY 2 LEVELS;  Surgeon: Faythe Ghee, MD;  Location: [facility];  Service: Neurosurgery;  Laterality: Left;  Lumbar four-five, lumbar five sacral one microdiscectomy    LUMBAR WOUND DEBRIDEMENT  04/29/2012   Procedure: LUMBAR WOUND DEBRIDEMENT;  Surgeon: Faythe Ghee, MD;  Location: [facility];  Service: Neurosurgery;  Laterality: N/A;  lumbar wound debridement   ROTATOR CUFF REPAIR     Right shoulder   TUBAL LIGATION      OB History     Gravida  4   Para  2   Term  0   Preterm  2   AB  2   Living  2      SAB  1   IAB  1   Ectopic      Multiple      Live Births  2            Home Medications    Prior to Admission medications   Medication Sig Start Date End Date Taking? Authorizing Provider  ACCU-CHEK GUIDE test strip See admin instructions. 09/20/20   [provider]  albuterol (PROVENTIL) (2.5 MG/3ML) 0.083% nebulizer solution Take 2.5 mg by nebulization every 4 (four) hours as needed for wheezing or shortness of breath.    [provider]  albuterol (VENTOLIN HFA) 108 (90 Base) MCG/ACT inhaler Inhale 2 puffs into the lungs every 4 (four) hours as needed for wheezing or shortness of breath.    [provider]  ARIPiprazole (ABILIFY) 20 MG tablet Take 1 tablet (20 mg total) by mouth at bedtime. 03/04/15   Lance Bosch, NP  blood glucose meter kit and supplies KIT Dispense based on patient and insurance preference. Use up to four times daily as directed. (FOR ICD-9 250.00, 250.01). 08/31/19   Nita Sells, MD  Budeson-Glycopyrrol-Formoterol (BREZTRI AEROSPHERE) 160-9-4.8 MCG/ACT AERO Inhale 2 puffs into the lungs in the morning and at bedtime. 12/18/20   Rigoberto Noel, MD  DULoxetine (CYMBALTA) 30 MG capsule Take 30 mg by mouth 2 (two) times daily. 11/18/20   [provider]  gabapentin (NEURONTIN) 300 MG capsule Take 300 mg by mouth 3 (three) times daily.  02/02/19   [provider]  glipiZIDE  (GLUCOTROL XL) 10 MG 24 hr tablet Take 10 mg by mouth daily with breakfast.     [provider]  hydrOXYzine (VISTARIL) 25 MG capsule Take 25 mg by mouth at bedtime.     [provider]  metFORMIN (GLUCOPHAGE) 1000 MG tablet Take 1,000 mg by mouth 2 (two) times daily with a meal.     [provider]  metoprolol succinate (TOPROL-XL) 25 MG 24 hr tablet Take 25 mg by mouth  daily. 11/15/20   [provider]  montelukast (SINGULAIR) 10 MG tablet Take 1 tablet (10 mg total) by mouth at bedtime. 09/20/19   Martyn Ehrich, NP  omeprazole (PRILOSEC) 40 MG capsule Take 1 capsule (40 mg total) by mouth daily. 12/14/17   [name], MD  Oxycodone HCl 10 MG TABS Take 10 mg by mouth 4 (four) times daily as needed for pain. 06/12/20   [provider]  phentermine (ADIPEX-P) 37.5 MG tablet Take 37.5 mg by mouth daily. 10/23/20   [provider]  potassium chloride SA (K-DUR,KLOR-CON) 20 MEQ tablet Take 40 mEq by mouth 2 (two) times daily.     [provider]  PRESCRIPTION MEDICATION Inhale into the lungs at bedtime. CPAP    [provider]  Respiratory Therapy Supplies (FLUTTER) DEVI Use flutter device 3 times a day 07/20/18   Martyn Ehrich, NP  topiramate (TOPAMAX) 50 MG tablet Take 50 mg by mouth daily.    [provider]  torsemide (DEMADEX) 20 MG tablet Take 2 tablets (40 mg total) by mouth 2 (two) times daily. 07/17/20   Kilroy, Luke K, PA-C  TOVIAZ 4 MG TB24 tablet Take 4 mg by mouth daily. 11/18/20   [provider]  traZODone (DESYREL) 50 MG tablet Take 50 mg by mouth at bedtime. 11/15/20   [provider]  Triamcinolone Acetonide 0.025 % LOTN Apply 1 application topically 3 (three) times daily as needed (to affected Areas).     [provider]  TRULICITY 9.41 DE/0.8XK SOPN Inject 0.75 mg into the skin every Sunday. 09/26/20   [provider]  valsartan (DIOVAN) 80 MG tablet Take 80 mg by  mouth daily.     [provider]  Vitamin D, Ergocalciferol, (DRISDOL) 1.25 MG (50000 UNIT) CAPS capsule Take 50,000 Units by mouth every Thursday. 10/23/20   [provider]    Family History Family History  Problem Relation Age of Onset   Diabetes Mother    Hypertension Mother    Diabetes Father    Heart disease Paternal Aunt    Cancer Paternal Aunt    Esophageal cancer Neg Hx    Stomach cancer Neg Hx     Social History Social History   Tobacco Use   Smoking status: Former    Packs/day: 1.00    Years: 24.00    Pack years: 24.00    Types: Cigarettes    Quit date: 12/16/2020    Years since quitting: 0.2   Smokeless tobacco: Never   Tobacco comments:     last cigarette on 12/16/2020  Vaping Use   Vaping Use: Every day   Substances: Nicotine  Substance Use Topics   Alcohol use: No    Alcohol/week: 0.0 standard drinks    Comment: quit Nov. 2014   Drug use: Not Currently    Types: Cocaine     Allergies   Aspirin   Review of Systems Review of Systems  As stated above in HPI Physical Exam Triage Vital Signs ED Triage Vitals  Enc Vitals Group     BP 03/12/21 1729 139/90     Pulse Rate 03/12/21 1729 91     Resp 03/12/21 1729 (!) 33     Temp --      Temp src --      SpO2 03/12/21 1729 96 %     Weight --      Height --      Head Circumference --  Peak Flow --      Pain Score 03/12/21 1728 10     Pain Loc --      Pain Edu? --      Excl. in [place]? --    No data found.  Updated Vital Signs BP 139/90   Pulse 91   Resp (!) 33   LMP 10/15/2018 Comment: patient had pregnancy test with PCP (test was negative)  SpO2 96%   Physical Exam Vitals and nursing note reviewed.  Constitutional:      General: She is in acute distress.     Appearance: She is well-developed. She is obese. She is ill-appearing. She is not toxic-appearing.  HENT:     Head: Normocephalic and atraumatic.     Mouth/Throat:     Mouth: Mucous membranes are moist.  Eyes:      Extraocular Movements: Extraocular movements intact.     Pupils: Pupils are equal, round, and reactive to light.  Cardiovascular:     Rate and Rhythm: Normal rate and regular rhythm.  Pulmonary:     Effort: Accessory muscle usage present.  Musculoskeletal:     Cervical back: Normal range of motion and neck supple.     Comments: Increased swelling of the left arm size compared to the right.   Skin:    General: Skin is warm.  Neurological:     Mental Status: She is alert.     UC Treatments / Results  Labs (all labs ordered are listed, but only abnormal results are displayed) Labs Reviewed - No data to display  EKG   Radiology No results found.  Procedures Procedures (including critical care time)  Medications Ordered in UC Medications - No data to display  Initial Impression / Assessment and Plan / UC Course  I have reviewed the triage vital signs and the nursing notes.  Pertinent labs & imaging results that were available during my care of the patient were reviewed by me and considered in my medical decision making (see chart for details).     New.  Patient appears in acute distress.  With her history of blood clots, current chest pain and shortness of breath along with history of using her asthma and COPD medications without any improvement I am concerned about a pulmonary embolism.  Patient does not appear well enough to be able to drive herself and she agrees.  EMS for transport to the hospital for PE work-up. Final Clinical Impressions(s) / UC Diagnoses   Final diagnoses:  None   Discharge Instructions   None    ED Prescriptions   None    PDMP not reviewed this encounter.   Hughie Closs, [place] 03/12/21 1746

## 2021-03-12 NOTE — Hospital Course (Signed)
Asthma exacerbation?  In the ED she received: lasix 20 mg, duo neb, mg sulfate, solumedrol 125 mg IV   T2DM - HM: glipizide 10 mg daily, metformin 6979 mg BID, Trulicity 4.80 mg q week  - A1c 6.6 (12/2020) HTN - HM: metorpolol succinate 25 mg qd, valsartan 80 mg qd  HFpEF  - HM: torsemide 20 mg BID - Weight at d/c in march was 122/4 kg, 269 lb  - Left ventricle: The cavity size was normal. Wall thickness was    increased in a pattern of mild LVH. Systolic function was normal.    The estimated ejection fraction was in the range of 60% to 65%.    Mildly reduced GLS at -16%. Wall motion was normal; there were no    regional wall motion abnormalities. Left ventricular diastolic    function parameters were normal.  - Mitral valve: Mildly thickened leaflets . There was trivial    regurgitation.  - Left atrium: The atrium was normal in size.  - Right atrium: The atrium was mildly dilated.  - Tricuspid valve: There was trivial regurgitation.  - Pulmonary arteries: PA peak pressure: 22 mm Hg (S).  - Inferior vena cava: The vessel was normal in size. The    respirophasic diameter changes were in the normal range (= 50%),    consistent with normal central venous pressure.   OSA: - CPAP  Asthma/COPD (GOLD C/D) - HM: albuterol, Breztri, montelukast 10 mg qHS,  - Last admitted on 12/2020  PFTs 07/20/2018 - FVC 1.77 (65%), FEV1 1.33 (60%), RATIO 75, DLCOcor 17.01 (78%)  Spirometry 07/04/18 - FVC 1.6 (59%), FEV1 1.1 (50%), ratio 69 Spirometry 10/28/2017- FEV1 1.01 (45%) Ratio 67  Obesity: - HM: phentermine 37.5 mg qd and topiramate 50 mg qd  Bipolar/Schizophrenia  - HM: aripiprazole 20 mg qd, duloxetine 30 mg BID, hydroxyzine 25 mg qHS, trazodone 50 mg qHS,   Chronic pain:  - HM: oxycodone 10 mg QID, and gabapentin 300 mg TID  Hepatic steatosis:  - CT abd/pelvis 04/2020 showed: moderate to severe hepatic steatosis with mild hepatomegaly    LIPID PANEL, STANDARD (7600)  2020-09-25    CHOLESTEROL, TOTAL 173  <200  HDL CHOLESTEROL 65  > OR = 50  TRIGLYCERIDES 254  <150  LDL-CHOLESTEROL 75    CHOL/HDLC RATIO 2.7  <5.0  NON HDL CHOLESTEROL 108  <130
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
## 2021-03-12 NOTE — ED Notes (Signed)
Patient is being discharged from the Urgent Care and sent to the Emergency Department via EMS. Per Nelwyn Salisbury PA, patient is in need of higher level of care due to shortness of breath, chest pain, concern for PE. Patient is aware and verbalizes understanding of plan of care.  Vitals:   03/12/21 1729 03/12/21 1735  BP: 139/90   Pulse: 91   Resp: (!) 33   SpO2: 96% 96%

## 2021-03-12 NOTE — ED Provider Notes (Signed)
[facility] EMERGENCY DEPARTMENT Provider Note   CSN: [id number] Arrival date & time: 03/12/21  1807     History Chief Complaint  Patient presents with   Chest Pain    Lori Bean is a 50 y.o. female.  50 yo F with a chief complaints of chest pain cough and shortness of breath.  Patient states been going on for about 2 or 3 days.  Went to urgent care and was sent here for evaluation.  Chest pain is left-sided and sharp worse with coughing.  Has a history of DVT but denies history of PE.  She has been trying her inhaler at home without significant improvement.  No known sick contacts.  No fevers.  The history is provided by the patient.  Chest Pain Pain location:  L chest Pain quality: sharp   Pain radiates to:  Does not radiate Pain severity:  Moderate Onset quality:  Gradual Duration:  3 days Timing:  Constant Progression:  Worsening Chronicity:  New Relieved by:  Nothing Worsened by:  Coughing Ineffective treatments:  None tried Associated symptoms: cough and shortness of breath   Associated symptoms: no dizziness, no fever, no headache, no nausea, no palpitations and no vomiting       Past Medical History:  Diagnosis Date   Abdominal hernia    Anginal pain (HCC)    occ from asthma   Anxiety    Arthritis    Asthma    Bipolar affective disorder (HCC)    Schizophrenia   Bronchitis    hx of   CHF (congestive heart failure) (HCC)    Depression    Diabetes mellitus without complication ([place])    " New Onset " per patient   GERD (gastroesophageal reflux disease)    Heart murmur    Hypertension    takes meds   Lymphedema 06/07/2018   Morbid (severe) obesity due to excess calories (HCC)    Neuropathy    left leg , "from back surgery"   Overactive bladder    Schizophrenia ([place])    Sciatica    Shortness of breath    Sleep apnea    Snoring disorder    Pt stated my boyfriend always wakes me up and tells me to breathe    Patient  Active Problem List   Diagnosis Date Noted   Asthma exacerbation 12/15/2020   Neck pain 10/18/2020   COPD with exacerbation ([place]) 06/29/2020   Diabetes mellitus type 2 in obese ([place]) 06/28/2020   Acute on chronic respiratory failure ([place]) 09/01/2019   COPD with acute exacerbation ([place]) 08/28/2019   COPD exacerbation ([place]) 08/28/2019   Acute bronchitis 08/16/2019   Tongue cancer ([place]) 02/14/2019   COPD mixed type ([place]) 08/17/2018   Sinusitis 07/22/2018   Lymphedema 06/07/2018   Abnormal uterine bleeding (AUB) 12/12/2017   CHF (congestive heart failure) (HCC)    Chronic asthma, mild persistent, uncomplicated 35/59/7416   Dyspnea on exertion 05/05/2017   Asthma exacerbation in COPD ([place]) 05/05/2017   Morbid obesity due to excess calories ([place]) 05/05/2017   Chondromalacia, right knee 38/45/3646   Synovial plica of right knee 80/32/1224   Tear of medial meniscus of right knee, initial encounter 09/28/2016   OSA (obstructive sleep apnea) 09/23/2016   Chronic diastolic heart failure ([place]) 04/22/2016   Cigarette smoker 04/03/2014   Chest pain 04/03/2014   Lumbar spondylosis 12/28/2013   Extrinsic asthma 06/07/2013   Anxiety    Essential hypertension    Bipolar affective disorder (  HCC)    GERD (gastroesophageal reflux disease)    Arthritis    Schizophrenia ([place])    Overactive bladder     Past Surgical History:  Procedure Laterality Date   BACK SURGERY     3 back surgeries   CHOLECYSTECTOMY     COLONOSCOPY WITH PROPOFOL N/A 01/23/2016   Procedure: COLONOSCOPY WITH PROPOFOL;  Surgeon: Wonda Horner, MD;  Location: [facility];  Service: Endoscopy;  Laterality: N/A;   EYE SURGERY     Metal plate in right eye. Had fracture in right eye   gallstones reomved     KNEE ARTHROSCOPY WITH MENISCAL REPAIR Right 09/28/2016   Procedure: KNEE ARTHROSCOPY WITH MENISCAL REPAIR;  Surgeon: Dorna Leitz, MD;  Location: [facility];  Service: Orthopedics;  Laterality: Right;  Right partial meniscectomy  and chondroplasty, patellar/femoral joint and medial femoral condyle    LUMBAR LAMINECTOMY/DECOMPRESSION MICRODISCECTOMY  04/01/2012   Procedure: LUMBAR LAMINECTOMY/DECOMPRESSION MICRODISCECTOMY 2 LEVELS;  Surgeon: Faythe Ghee, MD;  Location: [facility];  Service: Neurosurgery;  Laterality: Left;  Lumbar four-five, lumbar five sacral one microdiscectomy    LUMBAR WOUND DEBRIDEMENT  04/29/2012   Procedure: LUMBAR WOUND DEBRIDEMENT;  Surgeon: Faythe Ghee, MD;  Location: [facility];  Service: Neurosurgery;  Laterality: N/A;  lumbar wound debridement   ROTATOR CUFF REPAIR     Right shoulder   TUBAL LIGATION       OB History     Gravida  4   Para  2   Term  0   Preterm  2   AB  2   Living  2      SAB  1   IAB  1   Ectopic      Multiple      Live Births  2           Family History  Problem Relation Age of Onset   Diabetes Mother    Hypertension Mother    Diabetes Father    Heart disease Paternal Aunt    Cancer Paternal Aunt    Esophageal cancer Neg Hx    Stomach cancer Neg Hx     Social History   Tobacco Use   Smoking status: Former    Packs/day: 1.00    Years: 24.00    Pack years: 24.00    Types: Cigarettes    Quit date: 12/16/2020    Years since quitting: 0.2   Smokeless tobacco: Never   Tobacco comments:     last cigarette on 12/16/2020  Vaping Use   Vaping Use: Every day   Substances: Nicotine  Substance Use Topics   Alcohol use: No    Alcohol/week: 0.0 standard drinks    Comment: quit Nov. 2014   Drug use: Not Currently    Types: Cocaine    Home Medications Prior to Admission medications   Medication Sig Start Date End Date Taking? Authorizing Provider  ACCU-CHEK GUIDE test strip See admin instructions. 09/20/20   [provider]  albuterol (PROVENTIL) (2.5 MG/3ML) 0.083% nebulizer solution Take 2.5 mg by nebulization every 4 (four) hours as needed for wheezing or shortness of breath.    [provider]  albuterol  (VENTOLIN HFA) 108 (90 Base) MCG/ACT inhaler Inhale 2 puffs into the lungs every 4 (four) hours as needed for wheezing or shortness of breath.    [provider]  ARIPiprazole (ABILIFY) 20 MG tablet Take 1 tablet (20 mg total) by mouth at bedtime. 03/04/15   Feliciana Rossetti,  Jannifer Rodney, NP  blood glucose meter kit and supplies KIT Dispense based on patient and insurance preference. Use up to four times daily as directed. (FOR ICD-9 250.00, 250.01). 08/31/19   Nita Sells, MD  Budeson-Glycopyrrol-Formoterol (BREZTRI AEROSPHERE) 160-9-4.8 MCG/ACT AERO Inhale 2 puffs into the lungs in the morning and at bedtime. 12/18/20   Rigoberto Noel, MD  DULoxetine (CYMBALTA) 30 MG capsule Take 30 mg by mouth 2 (two) times daily. 11/18/20   [provider]  gabapentin (NEURONTIN) 300 MG capsule Take 300 mg by mouth 3 (three) times daily.  02/02/19   [provider]  glipiZIDE (GLUCOTROL XL) 10 MG 24 hr tablet Take 10 mg by mouth daily with breakfast.     [provider]  hydrOXYzine (VISTARIL) 25 MG capsule Take 25 mg by mouth at bedtime.     [provider]  metFORMIN (GLUCOPHAGE) 1000 MG tablet Take 1,000 mg by mouth 2 (two) times daily with a meal.     [provider]  metoprolol succinate (TOPROL-XL) 25 MG 24 hr tablet Take 25 mg by mouth daily. 11/15/20   [provider]  montelukast (SINGULAIR) 10 MG tablet Take 1 tablet (10 mg total) by mouth at bedtime. 09/20/19   Martyn Ehrich, NP  omeprazole (PRILOSEC) 40 MG capsule Take 1 capsule (40 mg total) by mouth daily. 12/14/17   [name], MD  Oxycodone HCl 10 MG TABS Take 10 mg by mouth 4 (four) times daily as needed for pain. 06/12/20   [provider]  phentermine (ADIPEX-P) 37.5 MG tablet Take 37.5 mg by mouth daily. 10/23/20   [provider]  potassium chloride SA (K-DUR,KLOR-CON) 20 MEQ tablet Take 40 mEq by mouth 2 (two) times daily.     [provider]  PRESCRIPTION  MEDICATION Inhale into the lungs at bedtime. CPAP    [provider]  Respiratory Therapy Supplies (FLUTTER) DEVI Use flutter device 3 times a day 07/20/18   Martyn Ehrich, NP  topiramate (TOPAMAX) 50 MG tablet Take 50 mg by mouth daily.    [provider]  torsemide (DEMADEX) 20 MG tablet Take 2 tablets (40 mg total) by mouth 2 (two) times daily. 07/17/20   Kilroy, Luke K, PA-C  TOVIAZ 4 MG TB24 tablet Take 4 mg by mouth daily. 11/18/20   [provider]  traZODone (DESYREL) 50 MG tablet Take 50 mg by mouth at bedtime. 11/15/20   [provider]  Triamcinolone Acetonide 0.025 % LOTN Apply 1 application topically 3 (three) times daily as needed (to affected Areas).     [provider]  TRULICITY 4.94 WH/6.7RF SOPN Inject 0.75 mg into the skin every Sunday. 09/26/20   [provider]  valsartan (DIOVAN) 80 MG tablet Take 80 mg by mouth daily.     [provider]  Vitamin D, Ergocalciferol, (DRISDOL) 1.25 MG (50000 UNIT) CAPS capsule Take 50,000 Units by mouth every Thursday. 10/23/20   [provider]    Allergies    Aspirin  Review of Systems   Review of Systems  Constitutional:  Negative for chills and fever.  HENT:  Negative for congestion and rhinorrhea.   Eyes:  Negative for redness and visual disturbance.  Respiratory:  Positive for cough and shortness of breath. Negative for wheezing.   Cardiovascular:  Positive for chest pain. Negative for palpitations.  Gastrointestinal:  Negative for nausea and vomiting.  Genitourinary:  Negative for dysuria and urgency.  Musculoskeletal:  Negative for arthralgias  and myalgias.  Skin:  Negative for pallor and wound.  Neurological:  Negative for dizziness and headaches.   Physical Exam Updated Vital Signs BP 132/86 (BP Location: Right Arm)   Pulse 82   Temp 98.7 F (37.1 C) (Oral)   Resp (!) 35   Ht _0  (1.575 m)   Wt 120.7 kg   LMP 10/15/2018 Comment: patient had  pregnancy test with PCP (test was negative)  SpO2 98%   BMI 48.67 kg/m   Physical Exam Vitals and nursing note reviewed.  Constitutional:      General: She is not in acute distress.    Appearance: She is well-developed. She is not diaphoretic.     Comments: BMI 48  HENT:     Head: Normocephalic and atraumatic.  Eyes:     Pupils: Pupils are equal, round, and reactive to light.  Cardiovascular:     Rate and Rhythm: Normal rate and regular rhythm.     Heart sounds: No murmur heard.   No friction rub. No gallop.  Pulmonary:     Effort: Pulmonary effort is normal.     Breath sounds: No wheezing or rales.     Comments:  tachypnea, diminished breath sounds in all fields end expiratory wheezes. Chest:     Chest wall: Tenderness present.     Comments: Palpation of the left chest wall reproduces the patient's symptoms. Abdominal:     General: There is no distension.     Palpations: Abdomen is soft.     Tenderness: There is no abdominal tenderness.  Musculoskeletal:        General: No tenderness.     Cervical back: Normal range of motion and neck supple.  Skin:    General: Skin is warm and dry.  Neurological:     Mental Status: She is alert and oriented to person, place, and time.  Psychiatric:        Behavior: Behavior normal.    ED Results / Procedures / Treatments   Labs (all labs ordered are listed, but only abnormal results are displayed) Labs Reviewed  CBC WITH DIFFERENTIAL/PLATELET - Abnormal; Notable for the following components:      Result Value   WBC 11.7 (*)    All other components within normal limits  RESP PANEL BY RT-PCR (FLU A&B, COVID) ARPGX2  BASIC METABOLIC PANEL  BRAIN NATRIURETIC PEPTIDE  TROPONIN I (HIGH SENSITIVITY)    EKG EKG Interpretation  Date/Time:  Wednesday March 12 2021 18:13:09 EDT Ventricular Rate:  88 PR Interval:  164 QRS Duration: 95 QT Interval:  377 QTC Calculation: 457 R Axis:   11 Text Interpretation: Sinus rhythm baseline  improved Otherwise no significant change Confirmed by Deno Etienne [phone]) on 03/12/2021 6:15:22 PM  Radiology No results found.  Procedures Procedures   Medications Ordered in ED Medications  magnesium sulfate IVPB 2 g 50 mL (2 g Intravenous New Bag/Given 03/12/21 1833)  ipratropium-albuterol (DUONEB) 0.5-2.5 (3) MG/3ML nebulizer solution 3 mL (3 mLs Nebulization Given 03/12/21 1833)  methylPREDNISolone sodium succinate (SOLU-MEDROL) 125 mg/2 mL injection 125 mg (125 mg Intravenous Given 03/12/21 1828)    ED Course  I have reviewed the triage vital signs and the nursing notes.  Pertinent labs & imaging results that were available during my care of the patient were reviewed by me and considered in my medical decision making (see chart for details).    MDM Rules/Calculators/A&P  50 yo F with a chief complaints of cough and shortness of breath.  Going on for about 3 to 4 days now.  Patient's breath sounds are significantly diminished in all fields with mild end expiratory wheezing.  Will give 3 duo nebs back to back Solu-Medrol and magnesium.  Chest x-ray blood work reassess.  Lung exam with some improvement though patient subjectively not feeling much improvement.  Persistent tachypnea diminished breath sounds.  We will give a continuous albuterol treatment.  Will discuss with medicine for admission.  CRITICAL CARE Performed by: Cecilio Asper   Total critical care time: 35 minutes  Critical care time was exclusive of separately billable procedures and treating other patients.  Critical care was necessary to treat or prevent imminent or life-threatening deterioration.  Critical care was time spent personally by me on the following activities: development of treatment plan with patient and/or surrogate as well as nursing, discussions with consultants, evaluation of patient's response to treatment, examination of patient, obtaining history from patient or  surrogate, ordering and performing treatments and interventions, ordering and review of laboratory studies, ordering and review of radiographic studies, pulse oximetry and re-evaluation of patient's condition.   The patients results and plan were reviewed and discussed.   Any x-rays performed were independently reviewed by myself.   Differential diagnosis were considered with the presenting HPI.  Medications  albuterol (PROVENTIL,VENTOLIN) solution continuous neb (has no administration in time range)  ipratropium-albuterol (DUONEB) 0.5-2.5 (3) MG/3ML nebulizer solution 3 mL (3 mLs Nebulization Given 03/12/21 1833)  methylPREDNISolone sodium succinate (SOLU-MEDROL) 125 mg/2 mL injection 125 mg (125 mg Intravenous Given 03/12/21 1828)  magnesium sulfate IVPB 2 g 50 mL (0 g Intravenous Stopped 03/12/21 1914)    Vitals:   03/12/21 1811 03/12/21 1838  BP:  132/86  Pulse:  82  Resp:  (!) 35  Temp:  98.7 F (37.1 C)  TempSrc:  Oral  SpO2:  98%  Weight: 120.7 kg   Height: _0  (1.575 m)     Final diagnoses:  None    Admission/ observation were discussed with the admitting physician, patient and/or family and they are comfortable with the plan.   Final Clinical Impression(s) / ED Diagnoses Final diagnoses:  None    Rx / DC Orders ED Discharge Orders     None        Deno Etienne, DO 03/12/21 2133

## 2021-03-12 NOTE — ED Triage Notes (Signed)
Brought in by EMS from [facility] for chest pain that started last night with sob x 3 days. Hx of CHF, DVT. Worsening leg swelling per pt. 96% on RA with diminished lung sounds. 1 nitro SL with no relief. Pt is allergic to aspirin. Alert and oriented x 4.

## 2021-03-12 NOTE — ED Notes (Signed)
Called report to ED charge RN.

## 2021-03-13 ENCOUNTER — Other Ambulatory Visit: Payer: Self-pay

## 2021-03-13 DIAGNOSIS — J4541 Moderate persistent asthma with (acute) exacerbation: Secondary | ICD-10-CM

## 2021-03-13 LAB — CBC
HCT: 40.1 % (ref 36.0–46.0)
Hemoglobin: 13.5 g/dL (ref 12.0–15.0)
MCH: 32 pg (ref 26.0–34.0)
MCHC: 33.7 g/dL (ref 30.0–36.0)
MCV: 95 fL (ref 80.0–100.0)
Platelets: 235 10*3/uL (ref 150–400)
RBC: 4.22 MIL/uL (ref 3.87–5.11)
RDW: 14 % (ref 11.5–15.5)
WBC: 10.3 10*3/uL (ref 4.0–10.5)
nRBC: 0 % (ref 0.0–0.2)

## 2021-03-13 LAB — GLUCOSE, CAPILLARY
Glucose-Capillary: 263 mg/dL — ABNORMAL HIGH (ref 70–99)
Glucose-Capillary: 191 mg/dL — ABNORMAL HIGH (ref 70–99)
Glucose-Capillary: 222 mg/dL — ABNORMAL HIGH (ref 70–99)
Glucose-Capillary: 216 mg/dL — ABNORMAL HIGH (ref 70–99)

## 2021-03-13 LAB — COMPREHENSIVE METABOLIC PANEL
ALT: 30 U/L (ref 0–44)
AST: 20 U/L (ref 15–41)
Albumin: 3.3 g/dL — ABNORMAL LOW (ref 3.5–5.0)
Alkaline Phosphatase: 74 U/L (ref 38–126)
Anion gap: 11 (ref 5–15)
BUN: 11 mg/dL (ref 6–20)
CO2: 24 mmol/L (ref 22–32)
Calcium: 9.6 mg/dL (ref 8.9–10.3)
Chloride: 101 mmol/L (ref 98–111)
Creatinine, Ser: 0.97 mg/dL (ref 0.44–1.00)
GFR, Estimated: >60 mL/min (ref >60)
Glucose, Bld: 257 mg/dL — ABNORMAL HIGH (ref 70–99)
Potassium: 4.3 mmol/L (ref 3.5–5.1)
Sodium: 136 mmol/L (ref 135–145)
Total Bilirubin: 0.5 mg/dL (ref 0.3–1.2)
Total Protein: 6.6 g/dL (ref 6.5–8.1)

## 2021-03-13 LAB — PROTIME-INR
INR: 0.9 (ref 0.8–1.2)
Prothrombin Time: 12.6 seconds (ref 11.4–15.2)

## 2021-03-13 LAB — LIPID PANEL
Cholesterol: 181 mg/dL (ref 0–200)
HDL: 73 mg/dL (ref >40)
LDL Cholesterol: 83 mg/dL (ref 0–99)
Total CHOL/HDL Ratio: 2.5 RATIO
Triglycerides: 127 mg/dL (ref <150)
VLDL: 25 mg/dL (ref 0–40)

## 2021-03-13 MED ORDER — OXYCODONE HCL 5 MG PO TABS
5.0000 mg | ORAL_TABLET | Freq: Three times a day (TID) | ORAL | Status: DC | PRN
  Administered 2021-03-13 – 2021-03-14 (×2): 5 mg via ORAL
  Filled 2021-03-13 (×2): qty 1

## 2021-03-13 MED ORDER — IRBESARTAN 75 MG PO TABS
75.0000 mg | ORAL_TABLET | Freq: Every day | ORAL | Status: DC
  Administered 2021-03-13 – 2021-03-14 (×2): 75 mg via ORAL
  Filled 2021-03-13 (×2): qty 1

## 2021-03-13 MED ORDER — METOPROLOL SUCCINATE ER 25 MG PO TB24
25.0000 mg | ORAL_TABLET | Freq: Every day | ORAL | Status: DC
  Administered 2021-03-13 – 2021-03-14 (×2): 25 mg via ORAL
  Filled 2021-03-13 (×2): qty 1

## 2021-03-13 MED ORDER — GUAIFENESIN-DM 100-10 MG/5ML PO SYRP: 5.0000 mL | ORAL_SOLUTION | ORAL | Status: DC | PRN

## 2021-03-13 NOTE — Progress Notes (Signed)
   Subjective:   Lori Bean reports she feels better today.  Her breathing has improved since yesterday.  However, she still has difficulty with ambulation and become short of breath really quickly.  States that it was difficult for her to just walk from her bed to her bathroom and back.  She does follow closely with her pulmonologist.  Reports that she has enough supplies of her home inhalers.  Does state that she has had asthma since she was a child.  States that she still continues to smoke about 1 pack a day.  Discussed at length about risks of continuing to smoke in the setting of her COPD/asthma overlap syndrome.  She is interested in smoking cessation.  Discussed use of Chantix at discharge to help with this, she agrees.  Objective:  Vital signs in last 24 hours: Vitals:   03/12/21 2242 03/13/21 0011 03/13/21 0308 03/13/21 0749  BP: 113/85   (!) 150/83  Pulse: 96 86  92  Resp: 18 18  18   Temp: 98.4 F (36.9 C)   98.3 F (36.8 C)  TempSrc: Oral   Oral  SpO2: (!) 88% 94%  97%  Weight:   120.7 kg   Height:       Physical exam: General: Obese middle-aged female, sitting up in bed, NAD. CV: Normal rate and regular rhythm, no murmurs rubs or gallops noted. Pulm: Clear to auscultation bilaterally, no respiratory distress noted and no adventitious sounds auscultated. MSK: No pitting edema noted bilaterally. Skin: Swelling of distal left wrist with a central area of discoloration, possibly from a bug bite. Neuro: AAOx4, no focal deficits noted.  Assessment/Plan:  Principal Problem:   Acute on chronic respiratory failure (HCC) Active Problems:   Chronic diastolic heart failure (HCC)   OSA (obstructive sleep apnea)   Chronic asthma, mild persistent, uncomplicated   COPD mixed type ([place])   Diabetes mellitus type 2 in obese ([place])  Acute on chronic respiratory failure COPD/asthma overlap syndrome with exacerbation OHS/OSA on CPAP Tobacco use disorder [place]  Pulmonology. Acute on chronic respiratory failure likely multifactorial in the setting of asthma/COPD overlap and OHS/OSA. On admission, patient had 3/3 cardinal symptoms (dyspnea, increased cough, increased sputum production) consistent with severe exacerbation and thus was started on 5-day course of steroids, azithromycin, and nebulizers. In regards to smoking cessation, patient with desire to quit and is interested in chantix at discharge. -continue prednisone 60mg  (day 2/5) -continue azithromycin (day 2/5) -duonebs prn, incruse ellipta, breo ellipta -robitussin-DM for cough relief -CPAP qhs -continue encouraging smoking cessation, consider chantix at discharge  HFpEF Appears close to euvolemic on exam. -continue home torsemide 40mg  BID -monitor I/O's -check morning BMP and magnesium, replete K>4.0 and Mg >2.0  HTN On home regimen of metoprolol succinate, valsartan, and torsemide. -continue home torsemide -resume home toprol-xl -can reintroduce home valsartan if patient become hypertensive  Prior to Admission Living Arrangement: Home Anticipated Discharge Location: Home Barriers to Discharge: continued medical management Dispo: Anticipated discharge in approximately 1-2 day(s).   Virl Axe, MD 03/13/2021, 8:28 AM Pager: [phone] After 5pm on weekdays and 1pm on weekends: On Call pager [phone]
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
## 2021-03-13 NOTE — Progress Notes (Signed)
Pt already wearing CPAP when RT came by for rounding. Advised pt to notify for RT if any further assistance is needed.

## 2021-03-14 LAB — BASIC METABOLIC PANEL
Anion gap: 11 (ref 5–15)
BUN: 27 mg/dL — ABNORMAL HIGH (ref 6–20)
CO2: 32 mmol/L (ref 22–32)
Calcium: 9.6 mg/dL (ref 8.9–10.3)
Chloride: 95 mmol/L — ABNORMAL LOW (ref 98–111)
Creatinine, Ser: 0.98 mg/dL (ref 0.44–1.00)
GFR, Estimated: >60 mL/min (ref >60)
Glucose, Bld: 147 mg/dL — ABNORMAL HIGH (ref 70–99)
Potassium: 3.8 mmol/L (ref 3.5–5.1)
Sodium: 138 mmol/L (ref 135–145)

## 2021-03-14 LAB — GLUCOSE, CAPILLARY
Glucose-Capillary: 155 mg/dL — ABNORMAL HIGH (ref 70–99)
Glucose-Capillary: 188 mg/dL — ABNORMAL HIGH (ref 70–99)

## 2021-03-14 LAB — MAGNESIUM: Magnesium: 2.1 mg/dL (ref 1.7–2.4)

## 2021-03-14 MED ORDER — PREDNISONE 20 MG PO TABS: 60.0000 mg | ORAL_TABLET | Freq: Every day | ORAL | 0 refills | Status: AC

## 2021-03-14 MED ORDER — VARENICLINE TARTRATE 0.5 MG PO TABS: ORAL_TABLET | ORAL | 0 refills | Status: DC

## 2021-03-14 MED ORDER — AZITHROMYCIN 250 MG PO TABS: 250.0000 mg | ORAL_TABLET | Freq: Every day | ORAL | 0 refills | Status: AC
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
## 2021-03-14 NOTE — TOC Transition Note (Signed)
Transition of Care [facility]) - CM/SW Discharge Note   Patient Details  Name: Lori Bean MRN: [id number] Date of Birth: May 26, 1971  Transition of Care [facility]) CM/SW Contact:  Zenon Mayo, RN Phone Number: 03/14/2021, 1:21 PM   Clinical Narrative:    NCM spoke with patient, she is for dc today, she would like a bsc and housing resources.  She states she has transportation at discharge. She has no issues with getting her medications.    Final next level of care: Home/Self Care Barriers to Discharge: No Barriers Identified   Patient Goals and CMS Choice Patient states their goals for this hospitalization and ongoing recovery are:: return home   Choice offered to / list presented to : NA  Discharge Placement                       Discharge Plan and Services   Discharge Planning Services: CM Consult Post Acute Care Choice: NA          DME Arranged: Bedside commode DME Agency: [employer] Date DME Agency Contacted: 03/14/21 Time DME Agency Contacted: 54 Representative spoke with at DME Agency: [place]: NA          Social Determinants of Health ([place]) Interventions     Readmission Risk Interventions Readmission Risk Prevention Plan 03/14/2021  Transportation Screening Complete  PCP or Specialist Appt within 3-5 Days Complete  HRI or [place] Complete  Social Work Consult for [place] Planning/Counseling Complete  Palliative Care Screening Not Applicable  Medication Review Press photographer) Complete  Some recent data might be hidden

## 2021-03-14 NOTE — Discharge Instructions (Signed)
Lori Bean, you were seen at [facility] for a flare up of your COPD/Asthma. You were given steroids and antibiotics. Now that you are not requiring oxygen and doing better you are well enough for discharge. Please take the following medications listed below and follow up with your primary care provider in one week.   Continue taking prednisone 60 mg daily for a total of five days, to end 03/17/21 Continue taking  Azithromycin 250 mg daily Start Chantix for smoking cessation. Take 0.5 mg once daily for three days, then take 0.5 mg twice daily for four days, then take 1 mg twice daily onwards.

## 2021-03-14 NOTE — TOC Initial Note (Signed)
Transition of Care [facility]) - Initial/Assessment Note    Patient Details  Name: Lori Bean MRN: [id number] Date of Birth: 1971-04-20  Transition of Care [facility]) CM/SW Contact:    Zenon Mayo, RN Phone Number: 03/14/2021, 1:19 PM  Clinical Narrative:                 NCM spoke with patient, she is for dc today, she would like a bsc and housing resources.  She states she has transportation at discharge. She has no issues with getting her medications.  Expected Discharge Plan: Home/Self Care Barriers to Discharge: No Barriers Identified   Patient Goals and CMS Choice Patient states their goals for this hospitalization and ongoing recovery are:: return home   Choice offered to / list presented to : NA  Expected Discharge Plan and Services Expected Discharge Plan: Home/Self Care   Discharge Planning Services: CM Consult Post Acute Care Choice: NA Living arrangements for the past 2 months: Single Family Home Expected Discharge Date: 03/14/21               DME Arranged: Bedside commode DME Agency: [employer] Date DME Agency Contacted: 03/14/21 Time DME Agency Contacted: 75 Representative spoke with at DME Agency: [place]: NA          Prior Living Arrangements/Services Living arrangements for the past 2 months: [place] Lives with:: Relatives Patient language and need for interpreter reviewed:: Yes Do you feel safe going back to the place where you live?: Yes      Need for Family Participation in Patient Care: Yes (Comment) Care giver support system in place?: Yes (comment)   Criminal Activity/Legal Involvement Pertinent to Current Situation/Hospitalization: No - Comment as needed  Activities of Daily Living Home Assistive Devices/Equipment: Walker (specify type) ADL Screening (condition at time of admission) Patient's cognitive ability adequate to safely complete daily activities?: Yes Is the patient deaf or have difficulty  hearing?: No Does the patient have difficulty seeing, even when wearing glasses/contacts?: No Does the patient have difficulty concentrating, remembering, or making decisions?: No Patient able to express need for assistance with ADLs?: Yes Does the patient have difficulty dressing or bathing?: No Independently performs ADLs?: Yes (appropriate for developmental age) Does the patient have difficulty walking or climbing stairs?: Yes Weakness of Legs: Both Weakness of Arms/Hands: None  Permission Sought/Granted                  Emotional Assessment   Attitude/Demeanor/Rapport: Engaged Affect (typically observed): Appropriate Orientation: : Oriented to Self, Oriented to Place, Oriented to  Time, Oriented to Situation Alcohol / Substance Use: Not Applicable Psych Involvement: No (comment)  Admission diagnosis:  Moderate persistent asthma with exacerbation [J45.41] COPD with acute exacerbation ([place]) [J44.1] Patient Active Problem List   Diagnosis Date Noted   Asthma exacerbation 12/15/2020   Neck pain 10/18/2020   COPD with exacerbation ([place]) 06/29/2020   Diabetes mellitus type 2 in obese ([place]) 06/28/2020   COPD with acute exacerbation ([place]) 08/28/2019   COPD exacerbation ([place]) 08/28/2019   Acute bronchitis 08/16/2019   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   COPD mixed type ([place]) 08/17/2018   Sinusitis 07/22/2018   Lymphedema 06/07/2018   Abnormal uterine bleeding (AUB) 12/12/2017   CHF (congestive heart failure) ([place])    Asthma with acute exacerbation 06/06/2017   Dyspnea on exertion 05/05/2017   Asthma exacerbation in COPD ([place]) 05/05/2017   Morbid obesity due to excess calories ([place]) 05/05/2017   Chondromalacia, right knee  16/06/9603   Synovial plica of right knee 54/05/8118   Tear of medial meniscus of right knee, initial encounter 09/28/2016   OSA (obstructive sleep apnea) 09/23/2016   Chronic diastolic heart failure ([place]) 04/22/2016   Cigarette smoker 04/03/2014   Chest  pain 04/03/2014   Lumbar spondylosis 12/28/2013   Extrinsic asthma 06/07/2013   Anxiety    Essential hypertension    Bipolar affective disorder ([place])    GERD (gastroesophageal reflux disease)    Arthritis    Schizophrenia ([place])    Overactive bladder    PCP:  [name], PA Pharmacy:   [facility] [address], [place] [address] Phone: [phone] Fax: [place] [address] [place] [address] Phone: [phone] Fax: [phone]     Social Determinants of Health (SDOH) Interventions    Readmission Risk Interventions Readmission Risk Prevention Plan 03/14/2021  Transportation Screening Complete  PCP or Specialist Appt within 3-5 Days Complete  HRI or [place] Complete  Social Work Consult for [place] Community Planning/Counseling Complete  Palliative Care Screening Not Applicable  Medication Review Press photographer) Complete  Some recent data might be hidden

## 2021-03-28 ENCOUNTER — Inpatient Hospital Stay (HOSPITAL_COMMUNITY): Payer: Medicaid Other

## 2021-03-28 ENCOUNTER — Emergency Department (HOSPITAL_COMMUNITY): Payer: Medicaid Other

## 2021-03-28 ENCOUNTER — Inpatient Hospital Stay: Payer: Self-pay

## 2021-03-28 ENCOUNTER — Inpatient Hospital Stay (HOSPITAL_COMMUNITY)
Admission: EM | Admit: 2021-03-28 | Discharge: 2021-04-03 | DRG: 208 | Disposition: A | Discharge status: Home Health | Payer: Medicaid Other | Attending: Internal Medicine | Admitting: Internal Medicine

## 2021-03-28 DIAGNOSIS — J449 Chronic obstructive pulmonary disease, unspecified: Secondary | ICD-10-CM | POA: Diagnosis present

## 2021-03-28 DIAGNOSIS — E785 Hyperlipidemia, unspecified: Secondary | ICD-10-CM | POA: Diagnosis present

## 2021-03-28 DIAGNOSIS — I110 Hypertensive heart disease with heart failure: Secondary | ICD-10-CM | POA: Diagnosis present

## 2021-03-28 DIAGNOSIS — Z886 Allergy status to analgesic agent status: Secondary | ICD-10-CM

## 2021-03-28 DIAGNOSIS — E1140 Type 2 diabetes mellitus with diabetic neuropathy, unspecified: Secondary | ICD-10-CM | POA: Diagnosis present

## 2021-03-28 DIAGNOSIS — J9602 Acute respiratory failure with hypercapnia: Secondary | ICD-10-CM | POA: Diagnosis present

## 2021-03-28 DIAGNOSIS — I2489 Other forms of acute ischemic heart disease: Secondary | ICD-10-CM

## 2021-03-28 DIAGNOSIS — J45901 Unspecified asthma with (acute) exacerbation: Secondary | ICD-10-CM | POA: Diagnosis present

## 2021-03-28 DIAGNOSIS — G4733 Obstructive sleep apnea (adult) (pediatric): Secondary | ICD-10-CM | POA: Diagnosis present

## 2021-03-28 DIAGNOSIS — Z9049 Acquired absence of other specified parts of digestive tract: Secondary | ICD-10-CM

## 2021-03-28 DIAGNOSIS — F209 Schizophrenia, unspecified: Secondary | ICD-10-CM | POA: Diagnosis present

## 2021-03-28 DIAGNOSIS — I248 Other forms of acute ischemic heart disease: Secondary | ICD-10-CM | POA: Diagnosis not present

## 2021-03-28 DIAGNOSIS — N179 Acute kidney failure, unspecified: Secondary | ICD-10-CM | POA: Diagnosis present

## 2021-03-28 DIAGNOSIS — Z9851 Tubal ligation status: Secondary | ICD-10-CM

## 2021-03-28 DIAGNOSIS — Z6841 Body Mass Index (BMI) 40.0 and over, adult: Secondary | ICD-10-CM | POA: Diagnosis not present

## 2021-03-28 DIAGNOSIS — E872 Acidosis: Secondary | ICD-10-CM | POA: Diagnosis present

## 2021-03-28 DIAGNOSIS — R4182 Altered mental status, unspecified: Secondary | ICD-10-CM

## 2021-03-28 DIAGNOSIS — F419 Anxiety disorder, unspecified: Secondary | ICD-10-CM | POA: Diagnosis present

## 2021-03-28 DIAGNOSIS — I959 Hypotension, unspecified: Secondary | ICD-10-CM | POA: Diagnosis present

## 2021-03-28 DIAGNOSIS — Z452 Encounter for adjustment and management of vascular access device: Secondary | ICD-10-CM

## 2021-03-28 DIAGNOSIS — I214 Non-ST elevation (NSTEMI) myocardial infarction: Secondary | ICD-10-CM | POA: Diagnosis not present

## 2021-03-28 DIAGNOSIS — I890 Lymphedema, not elsewhere classified: Secondary | ICD-10-CM | POA: Diagnosis present

## 2021-03-28 DIAGNOSIS — I10 Essential (primary) hypertension: Secondary | ICD-10-CM | POA: Diagnosis not present

## 2021-03-28 DIAGNOSIS — F319 Bipolar disorder, unspecified: Secondary | ICD-10-CM | POA: Diagnosis present

## 2021-03-28 DIAGNOSIS — I5032 Chronic diastolic (congestive) heart failure: Secondary | ICD-10-CM | POA: Diagnosis present

## 2021-03-28 DIAGNOSIS — R079 Chest pain, unspecified: Secondary | ICD-10-CM | POA: Diagnosis not present

## 2021-03-28 DIAGNOSIS — Z01818 Encounter for other preprocedural examination: Secondary | ICD-10-CM | POA: Diagnosis present

## 2021-03-28 DIAGNOSIS — J9601 Acute respiratory failure with hypoxia: Secondary | ICD-10-CM | POA: Diagnosis present

## 2021-03-28 DIAGNOSIS — F17200 Nicotine dependence, unspecified, uncomplicated: Secondary | ICD-10-CM | POA: Diagnosis present

## 2021-03-28 DIAGNOSIS — G9341 Metabolic encephalopathy: Secondary | ICD-10-CM | POA: Diagnosis present

## 2021-03-28 DIAGNOSIS — A419 Sepsis, unspecified organism: Secondary | ICD-10-CM

## 2021-03-28 DIAGNOSIS — E1165 Type 2 diabetes mellitus with hyperglycemia: Secondary | ICD-10-CM | POA: Diagnosis present

## 2021-03-28 DIAGNOSIS — Z8249 Family history of ischemic heart disease and other diseases of the circulatory system: Secondary | ICD-10-CM

## 2021-03-28 DIAGNOSIS — Z20822 Contact with and (suspected) exposure to covid-19: Secondary | ICD-10-CM | POA: Diagnosis present

## 2021-03-28 DIAGNOSIS — Z79899 Other long term (current) drug therapy: Secondary | ICD-10-CM

## 2021-03-28 DIAGNOSIS — R6521 Severe sepsis with septic shock: Secondary | ICD-10-CM

## 2021-03-28 DIAGNOSIS — R0689 Other abnormalities of breathing: Secondary | ICD-10-CM | POA: Diagnosis not present

## 2021-03-28 DIAGNOSIS — K219 Gastro-esophageal reflux disease without esophagitis: Secondary | ICD-10-CM | POA: Diagnosis present

## 2021-03-28 DIAGNOSIS — J9600 Acute respiratory failure, unspecified whether with hypoxia or hypercapnia: Secondary | ICD-10-CM | POA: Insufficient documentation

## 2021-03-28 DIAGNOSIS — R778 Other specified abnormalities of plasma proteins: Secondary | ICD-10-CM | POA: Diagnosis not present

## 2021-03-28 DIAGNOSIS — Z7984 Long term (current) use of oral hypoglycemic drugs: Secondary | ICD-10-CM

## 2021-03-28 LAB — COMPREHENSIVE METABOLIC PANEL
ALT: 96 U/L — ABNORMAL HIGH (ref 0–44)
ALT: 157 U/L — ABNORMAL HIGH (ref 0–44)
AST: 102 U/L — ABNORMAL HIGH (ref 15–41)
AST: 99 U/L — ABNORMAL HIGH (ref 15–41)
Albumin: 3.2 g/dL — ABNORMAL LOW (ref 3.5–5.0)
Albumin: 3.1 g/dL — ABNORMAL LOW (ref 3.5–5.0)
Alkaline Phosphatase: 80 U/L (ref 38–126)
Alkaline Phosphatase: 71 U/L (ref 38–126)
Anion gap: 19 — ABNORMAL HIGH (ref 5–15)
Anion gap: 9 (ref 5–15)
BUN: 13 mg/dL (ref 6–20)
BUN: 13 mg/dL (ref 6–20)
CO2: 16 mmol/L — ABNORMAL LOW (ref 22–32)
CO2: 26 mmol/L (ref 22–32)
Calcium: 9.1 mg/dL (ref 8.9–10.3)
Calcium: 9 mg/dL (ref 8.9–10.3)
Chloride: 101 mmol/L (ref 98–111)
Chloride: 105 mmol/L (ref 98–111)
Creatinine, Ser: 1.21 mg/dL — ABNORMAL HIGH (ref 0.44–1.00)
Creatinine, Ser: 1 mg/dL (ref 0.44–1.00)
GFR, Estimated: 54 mL/min — ABNORMAL LOW (ref >60)
GFR, Estimated: >60 mL/min (ref >60)
Glucose, Bld: 341 mg/dL — ABNORMAL HIGH (ref 70–99)
Glucose, Bld: 131 mg/dL — ABNORMAL HIGH (ref 70–99)
Potassium: 5.1 mmol/L (ref 3.5–5.1)
Potassium: 3.5 mmol/L (ref 3.5–5.1)
Sodium: 136 mmol/L (ref 135–145)
Sodium: 140 mmol/L (ref 135–145)
Total Bilirubin: 0.5 mg/dL (ref 0.3–1.2)
Total Bilirubin: 0.6 mg/dL (ref 0.3–1.2)
Total Protein: 6.3 g/dL — ABNORMAL LOW (ref 6.5–8.1)
Total Protein: 6.4 g/dL — ABNORMAL LOW (ref 6.5–8.1)

## 2021-03-28 LAB — I-STAT ARTERIAL BLOOD GAS, ED
Acid-base deficit: 5 mmol/L — ABNORMAL HIGH (ref 0.0–2.0)
Acid-base deficit: 3 mmol/L — ABNORMAL HIGH (ref 0.0–2.0)
Bicarbonate: 25.7 mmol/L (ref 20.0–28.0)
Bicarbonate: 23.9 mmol/L (ref 20.0–28.0)
Calcium, Ion: 1.27 mmol/L (ref 1.15–1.40)
Calcium, Ion: 1.22 mmol/L (ref 1.15–1.40)
HCT: 42 % (ref 36.0–46.0)
HCT: 38 % (ref 36.0–46.0)
Hemoglobin: 14.3 g/dL (ref 12.0–15.0)
Hemoglobin: 12.9 g/dL (ref 12.0–15.0)
O2 Saturation: 100 %
O2 Saturation: 88 %
Patient temperature: 96
Patient temperature: 96
Potassium: 3.9 mmol/L (ref 3.5–5.1)
Potassium: 3.6 mmol/L (ref 3.5–5.1)
Sodium: 137 mmol/L (ref 135–145)
Sodium: 137 mmol/L (ref 135–145)
TCO2: 28 mmol/L (ref 22–32)
TCO2: 25 mmol/L (ref 22–32)
pCO2 arterial: 70.4 mmHg (ref 32.0–48.0)
pCO2 arterial: 47 mmHg (ref 32.0–48.0)
pH, Arterial: 7.161 — CL (ref 7.350–7.450)
pH, Arterial: 7.307 — ABNORMAL LOW (ref 7.350–7.450)
pO2, Arterial: 382 mmHg — ABNORMAL HIGH (ref 83.0–108.0)
pO2, Arterial: 56 mmHg — ABNORMAL LOW (ref 83.0–108.0)

## 2021-03-28 LAB — I-STAT BETA HCG BLOOD, ED (MC, WL, AP ONLY): I-stat hCG, quantitative: <5 m[IU]/mL

## 2021-03-28 LAB — CBC WITH DIFFERENTIAL/PLATELET
Abs Immature Granulocytes: 0.99 10*3/uL — ABNORMAL HIGH (ref 0.00–0.07)
Basophils Absolute: 0.1 10*3/uL (ref 0.0–0.1)
Basophils Relative: 0 %
Eosinophils Absolute: 0.2 10*3/uL (ref 0.0–0.5)
Eosinophils Relative: 1 %
HCT: 45.7 % (ref 36.0–46.0)
Hemoglobin: 13.9 g/dL (ref 12.0–15.0)
Immature Granulocytes: 4 %
Lymphocytes Relative: 49 %
Lymphs Abs: 12.7 10*3/uL — ABNORMAL HIGH (ref 0.7–4.0)
MCH: 32.1 pg (ref 26.0–34.0)
MCHC: 30.4 g/dL (ref 30.0–36.0)
MCV: 105.5 fL — ABNORMAL HIGH (ref 80.0–100.0)
Monocytes Absolute: 1.6 10*3/uL — ABNORMAL HIGH (ref 0.1–1.0)
Monocytes Relative: 6 %
Neutro Abs: 10.5 10*3/uL — ABNORMAL HIGH (ref 1.7–7.7)
Neutrophils Relative %: 40 %
Platelets: 250 10*3/uL (ref 150–400)
RBC: 4.33 MIL/uL (ref 3.87–5.11)
RDW: 13.8 % (ref 11.5–15.5)
WBC: 26.2 10*3/uL — ABNORMAL HIGH (ref 4.0–10.5)
nRBC: 0.1 % (ref 0.0–0.2)

## 2021-03-28 LAB — URINALYSIS, ROUTINE W REFLEX MICROSCOPIC
Bacteria, UA: NONE SEEN
Bilirubin Urine: NEGATIVE
Glucose, UA: NEGATIVE mg/dL
Ketones, ur: NEGATIVE mg/dL
Leukocytes,Ua: NEGATIVE
Nitrite: NEGATIVE
Protein, ur: 100 mg/dL — AB
Specific Gravity, Urine: 1.032 — ABNORMAL HIGH (ref 1.005–1.030)
pH: 5 (ref 5.0–8.0)

## 2021-03-28 LAB — I-STAT CHEM 8, ED
BUN: 15 mg/dL (ref 6–20)
Calcium, Ion: 1.22 mmol/L (ref 1.15–1.40)
Chloride: 105 mmol/L (ref 98–111)
Creatinine, Ser: 1.1 mg/dL — ABNORMAL HIGH (ref 0.44–1.00)
Glucose, Bld: 335 mg/dL — ABNORMAL HIGH (ref 70–99)
HCT: 44 % (ref 36.0–46.0)
Hemoglobin: 15 g/dL (ref 12.0–15.0)
Potassium: 5 mmol/L (ref 3.5–5.1)
Sodium: 135 mmol/L (ref 135–145)
TCO2: 19 mmol/L — ABNORMAL LOW (ref 22–32)

## 2021-03-28 LAB — RAPID URINE DRUG SCREEN, HOSP PERFORMED
Amphetamines: NOT DETECTED
Barbiturates: NOT DETECTED
Benzodiazepines: NOT DETECTED
Cocaine: NOT DETECTED
Opiates: NOT DETECTED
Tetrahydrocannabinol: POSITIVE — AB

## 2021-03-28 LAB — CBC
HCT: 40.6 % (ref 36.0–46.0)
Hemoglobin: 13.4 g/dL (ref 12.0–15.0)
MCH: 32.8 pg (ref 26.0–34.0)
MCHC: 33 g/dL (ref 30.0–36.0)
MCV: 99.5 fL (ref 80.0–100.0)
Platelets: 188 10*3/uL (ref 150–400)
RBC: 4.08 MIL/uL (ref 3.87–5.11)
RDW: 14.1 % (ref 11.5–15.5)
WBC: 9.9 10*3/uL (ref 4.0–10.5)
nRBC: 0 % (ref 0.0–0.2)

## 2021-03-28 LAB — LACTIC ACID, PLASMA
Lactic Acid, Venous: >11 mmol/L (ref 0.5–1.9)
Lactic Acid, Venous: 1.4 mmol/L (ref 0.5–1.9)

## 2021-03-28 LAB — SALICYLATE LEVEL: Salicylate Lvl: <7 mg/dL — ABNORMAL LOW (ref 7.0–30.0)

## 2021-03-28 LAB — HEMOGLOBIN A1C
Hgb A1c MFr Bld: 7.2 % — ABNORMAL HIGH (ref 4.8–5.6)
Mean Plasma Glucose: 159.94 mg/dL

## 2021-03-28 LAB — AMMONIA
Ammonia: 189 umol/L — ABNORMAL HIGH (ref 9–35)
Ammonia: 30 umol/L (ref 9–35)

## 2021-03-28 LAB — PROTIME-INR
INR: 1 (ref 0.8–1.2)
Prothrombin Time: 12.7 seconds (ref 11.4–15.2)

## 2021-03-28 LAB — BRAIN NATRIURETIC PEPTIDE: B Natriuretic Peptide: 43.4 pg/mL (ref 0.0–100.0)

## 2021-03-28 LAB — ACETAMINOPHEN LEVEL: Acetaminophen (Tylenol), Serum: <10 ug/mL — ABNORMAL LOW (ref 10–30)

## 2021-03-28 LAB — TROPONIN I (HIGH SENSITIVITY): Troponin I (High Sensitivity): 44 ng/L — ABNORMAL HIGH

## 2021-03-28 LAB — MRSA NEXT GEN BY PCR, NASAL: MRSA by PCR Next Gen: NOT DETECTED

## 2021-03-28 LAB — RESP PANEL BY RT-PCR (FLU A&B, COVID) ARPGX2
Influenza A by PCR: NEGATIVE
Influenza B by PCR: NEGATIVE
SARS Coronavirus 2 by RT PCR: NEGATIVE

## 2021-03-28 LAB — CBG MONITORING, ED: Glucose-Capillary: 134 mg/dL — ABNORMAL HIGH (ref 70–99)

## 2021-03-28 LAB — GLUCOSE, CAPILLARY
Glucose-Capillary: 118 mg/dL — ABNORMAL HIGH (ref 70–99)
Glucose-Capillary: 93 mg/dL (ref 70–99)

## 2021-03-28 LAB — ETHANOL: Alcohol, Ethyl (B): <10 mg/dL (ref <10)

## 2021-03-28 LAB — BETA-HYDROXYBUTYRIC ACID: Beta-Hydroxybutyric Acid: 0.13 mmol/L (ref 0.05–0.27)

## 2021-03-28 LAB — MAGNESIUM: Magnesium: 2.1 mg/dL (ref 1.7–2.4)

## 2021-03-28 MED ORDER — PROPOFOL 1000 MG/100ML IV EMUL
0.0000 ug/kg/min | INTRAVENOUS | Status: DC
  Administered 2021-03-28: 50 ug/kg/min via INTRAVENOUS
  Administered 2021-03-28: 49.99 ug/kg/min via INTRAVENOUS
  Administered 2021-03-28 (×4): 50 ug/kg/min via INTRAVENOUS
  Filled 2021-03-28 (×8): qty 100

## 2021-03-28 MED ORDER — LACTULOSE 10 GM/15ML PO SOLN
10.0000 g | Freq: Two times a day (BID) | ORAL | Status: DC
  Administered 2021-03-28 – 2021-03-29 (×4): 10 g
  Filled 2021-03-28 (×4): qty 15

## 2021-03-28 MED ORDER — POLYETHYLENE GLYCOL 3350 17 G PO PACK
17.0000 g | PACK | Freq: Every day | ORAL | Status: DC
  Administered 2021-03-29: 17 g
  Filled 2021-03-28 (×2): qty 1

## 2021-03-28 MED ORDER — SODIUM CHLORIDE 0.9 % IV SOLN: 2.0000 g | Freq: Three times a day (TID) | INTRAVENOUS | Status: DC

## 2021-03-28 MED ORDER — FENTANYL 2500MCG IN NS 250ML (10MCG/ML) PREMIX INFUSION
0.0000 ug/h | INTRAVENOUS | Status: DC
  Administered 2021-03-28: 25 ug/h via INTRAVENOUS
  Filled 2021-03-28: qty 250

## 2021-03-28 MED ORDER — FENTANYL CITRATE (PF) 100 MCG/2ML IJ SOLN
50.0000 ug | Freq: Once | INTRAMUSCULAR | Status: AC
Start: 2021-03-28 — End: 2021-03-28

## 2021-03-28 MED ORDER — LACTATED RINGERS IV BOLUS
500.0000 mL | Freq: Once | INTRAVENOUS | Status: AC
  Administered 2021-03-28: 500 mL via INTRAVENOUS

## 2021-03-28 MED ORDER — PANTOPRAZOLE SODIUM 40 MG PO PACK
40.0000 mg | PACK | Freq: Every day | ORAL | Status: DC
  Administered 2021-03-29 – 2021-03-30 (×2): 40 mg
  Filled 2021-03-28 (×4): qty 20

## 2021-03-28 MED ORDER — IOHEXOL 350 MG/ML SOLN
75.0000 mL | Freq: Once | INTRAVENOUS | Status: AC | PRN
  Administered 2021-03-28: 75 mL via INTRAVENOUS

## 2021-03-28 MED ORDER — PROPOFOL 1000 MG/100ML IV EMUL
INTRAVENOUS | Status: AC
  Administered 2021-03-28: 50 ug/kg/min via INTRAVENOUS
  Filled 2021-03-28: qty 100

## 2021-03-28 MED ORDER — MIDAZOLAM HCL 2 MG/2ML IJ SOLN
0.5000 mg | INTRAMUSCULAR | Status: DC | PRN
  Filled 2021-03-28: qty 2

## 2021-03-28 MED ORDER — IPRATROPIUM-ALBUTEROL 0.5-2.5 (3) MG/3ML IN SOLN
3.0000 mL | Freq: Four times a day (QID) | RESPIRATORY_TRACT | Status: DC
  Administered 2021-03-28 – 2021-03-30 (×10): 3 mL via RESPIRATORY_TRACT
  Filled 2021-03-28 (×10): qty 3

## 2021-03-28 MED ORDER — HYDRALAZINE HCL 20 MG/ML IJ SOLN: 10.0000 mg | INTRAMUSCULAR | Status: DC | PRN

## 2021-03-28 MED ORDER — SODIUM CHLORIDE 0.9 % IV SOLN
2.0000 g | Freq: Once | INTRAVENOUS | Status: AC
  Administered 2021-03-28: 2 g via INTRAVENOUS
  Filled 2021-03-28: qty 2

## 2021-03-28 MED ORDER — FENTANYL BOLUS VIA INFUSION
50.0000 ug | INTRAVENOUS | Status: DC | PRN
  Administered 2021-03-28 – 2021-03-30 (×3): 100 ug via INTRAVENOUS
  Filled 2021-03-28: qty 200

## 2021-03-28 MED ORDER — METRONIDAZOLE 500 MG/100ML IV SOLN
500.0000 mg | Freq: Once | INTRAVENOUS | Status: AC
  Administered 2021-03-28: 500 mg via INTRAVENOUS
  Filled 2021-03-28: qty 100

## 2021-03-28 MED ORDER — HEPARIN SODIUM (PORCINE) 5000 UNIT/ML IJ SOLN
5000.0000 [IU] | Freq: Three times a day (TID) | INTRAMUSCULAR | Status: DC
  Administered 2021-03-28 (×2): 5000 [IU] via SUBCUTANEOUS
  Filled 2021-03-28 (×2): qty 1

## 2021-03-28 MED ORDER — FENTANYL 2500MCG IN NS 250ML (10MCG/ML) PREMIX INFUSION
50.0000 ug/h | INTRAVENOUS | Status: DC
  Administered 2021-03-28: 150 ug/h via INTRAVENOUS
  Administered 2021-03-28: 175 ug/h via INTRAVENOUS
  Administered 2021-03-29: 200 ug/h via INTRAVENOUS
  Administered 2021-03-29: 100 ug/h via INTRAVENOUS
  Filled 2021-03-28 (×3): qty 250

## 2021-03-28 MED ORDER — DOCUSATE SODIUM 50 MG/5ML PO LIQD
100.0000 mg | Freq: Two times a day (BID) | ORAL | Status: DC
  Administered 2021-03-28 – 2021-03-29 (×3): 100 mg
  Filled 2021-03-28 (×4): qty 10

## 2021-03-28 MED ORDER — PROPOFOL 1000 MG/100ML IV EMUL
5.0000 ug/kg/min | INTRAVENOUS | Status: DC
  Administered 2021-03-28: 30 ug/kg/min via INTRAVENOUS
  Administered 2021-03-28: 50 ug/kg/min via INTRAVENOUS
  Filled 2021-03-28 (×2): qty 100

## 2021-03-28 MED ORDER — FENTANYL CITRATE (PF) 100 MCG/2ML IJ SOLN
INTRAMUSCULAR | Status: AC | PRN
  Administered 2021-03-28: 200 ug via INTRAVENOUS

## 2021-03-28 MED ORDER — NICOTINE 7 MG/24HR TD PT24
7.0000 mg | MEDICATED_PATCH | Freq: Every day | TRANSDERMAL | Status: DC
  Administered 2021-03-29: 7 mg via TRANSDERMAL
  Filled 2021-03-28 (×6): qty 1

## 2021-03-28 MED ORDER — CHLORHEXIDINE GLUCONATE 0.12% ORAL RINSE (MEDLINE KIT)
15.0000 mL | Freq: Two times a day (BID) | OROMUCOSAL | Status: DC
  Administered 2021-03-28 – 2021-03-30 (×4): 15 mL via OROMUCOSAL

## 2021-03-28 MED ORDER — ETOMIDATE 2 MG/ML IV SOLN
INTRAVENOUS | Status: AC | PRN
  Administered 2021-03-28: 20 mg via INTRAVENOUS

## 2021-03-28 MED ORDER — BUDESONIDE 0.25 MG/2ML IN SUSP
0.2500 mg | Freq: Two times a day (BID) | RESPIRATORY_TRACT | Status: DC
  Administered 2021-03-28 – 2021-04-03 (×12): 0.25 mg via RESPIRATORY_TRACT
  Filled 2021-03-28 (×12): qty 2

## 2021-03-28 MED ORDER — SODIUM CHLORIDE 0.9 % IV SOLN
2.0000 g | INTRAVENOUS | Status: DC
  Administered 2021-03-29: 2 g via INTRAVENOUS
  Filled 2021-03-28 (×2): qty 20

## 2021-03-28 MED ORDER — VANCOMYCIN HCL IN DEXTROSE 1-5 GM/200ML-% IV SOLN
1000.0000 mg | Freq: Two times a day (BID) | INTRAVENOUS | Status: DC
  Administered 2021-03-28 – 2021-03-29 (×3): 1000 mg via INTRAVENOUS
  Filled 2021-03-28 (×3): qty 200

## 2021-03-28 MED ORDER — VANCOMYCIN HCL 2000 MG/400ML IV SOLN
2000.0000 mg | Freq: Once | INTRAVENOUS | Status: AC
  Administered 2021-03-28: 2000 mg via INTRAVENOUS
  Filled 2021-03-28: qty 400

## 2021-03-28 MED ORDER — DOCUSATE SODIUM 100 MG PO CAPS: 100.0000 mg | ORAL_CAPSULE | Freq: Two times a day (BID) | ORAL | Status: DC | PRN

## 2021-03-28 MED ORDER — DOCUSATE SODIUM 50 MG/5ML PO LIQD: 100.0000 mg | Freq: Two times a day (BID) | ORAL | Status: DC | PRN

## 2021-03-28 MED ORDER — FENTANYL CITRATE (PF) 100 MCG/2ML IJ SOLN
INTRAMUSCULAR | Status: AC
  Filled 2021-03-28: qty 4

## 2021-03-28 MED ORDER — INSULIN ASPART 100 UNIT/ML IJ SOLN
0.0000 [IU] | INTRAMUSCULAR | Status: DC
  Administered 2021-03-28 – 2021-03-29 (×2): 2 [IU] via SUBCUTANEOUS
  Administered 2021-03-29 (×2): 3 [IU] via SUBCUTANEOUS
  Administered 2021-03-29: 2 [IU] via SUBCUTANEOUS
  Administered 2021-03-29: 3 [IU] via SUBCUTANEOUS
  Administered 2021-03-30: 2 [IU] via SUBCUTANEOUS
  Administered 2021-03-30 (×3): 3 [IU] via SUBCUTANEOUS

## 2021-03-28 MED ORDER — IPRATROPIUM-ALBUTEROL 0.5-2.5 (3) MG/3ML IN SOLN
RESPIRATORY_TRACT | Status: AC
  Administered 2021-03-28: 6 mL
  Filled 2021-03-28: qty 6

## 2021-03-28 MED ORDER — POLYETHYLENE GLYCOL 3350 17 G PO PACK: 17.0000 g | PACK | Freq: Every day | ORAL | Status: DC | PRN

## 2021-03-28 MED ORDER — ACETAMINOPHEN 325 MG PO TABS
650.0000 mg | ORAL_TABLET | ORAL | Status: DC | PRN
  Administered 2021-03-31 – 2021-04-01 (×3): 650 mg
  Filled 2021-03-28 (×3): qty 2

## 2021-03-28 MED ORDER — ORAL CARE MOUTH RINSE
15.0000 mL | OROMUCOSAL | Status: DC
  Administered 2021-03-28 – 2021-03-30 (×16): 15 mL via OROMUCOSAL

## 2021-03-28 MED ORDER — MONTELUKAST SODIUM 10 MG PO TABS
10.0000 mg | ORAL_TABLET | Freq: Every day | ORAL | Status: DC
  Administered 2021-03-28 – 2021-03-30 (×3): 10 mg
  Filled 2021-03-28 (×4): qty 1

## 2021-03-28 NOTE — ED Provider Notes (Signed)
[facility] EMERGENCY DEPARTMENT Provider Note   CSN: [id number] Arrival date & time: 03/28/21  2979     History Chief Complaint  Patient presents with   Respiratory Distress   Altered Mental Status    Lori Bean is a 50 y.o. female.  Patient brought to the emergency department by ambulance from home.  Patient reportedly collapsed at home prior to arrival.  EMS report that the significant other said that she was talking to him 10 minutes before and was fine.  He then found her unresponsive.  EMS report significant hypotension and respiratory distress during transport.  Breathing was assisted by bag-valve-mask during transport.  She was administered Narcan with minimal effect.  Level 5 caveat due to acuity.      Past Medical History:  Diagnosis Date   Abdominal hernia    Anginal pain ([place])    occ from asthma   Anxiety    Arthritis    Asthma    Bipolar affective disorder ([place])    Schizophrenia   Bronchitis    hx of   CHF (congestive heart failure) ([place])    Depression    Diabetes mellitus without complication ([place])    " New Onset " per patient   GERD (gastroesophageal reflux disease)    Heart murmur    Hypertension    takes meds   Lymphedema 06/07/2018   Morbid (severe) obesity due to excess calories (HCC)    Neuropathy    left leg , "from back surgery"   Overactive bladder    Schizophrenia ([place])    Sciatica    Shortness of breath    Sleep apnea    Snoring disorder    Pt stated my boyfriend always wakes me up and tells me to breathe    Patient Active Problem List   Diagnosis Date Noted   Asthma exacerbation 12/15/2020   Neck pain 10/18/2020   COPD with exacerbation ([place]) 06/29/2020   Diabetes mellitus type 2 in obese ([place]) 06/28/2020   COPD with acute exacerbation ([place]) 08/28/2019   COPD exacerbation ([place]) 08/28/2019   Acute bronchitis 08/16/2019   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   Sinusitis  07/22/2018   Lymphedema 06/07/2018   Abnormal uterine bleeding (AUB) 12/12/2017   CHF (congestive heart failure) ([place])    Asthma with acute exacerbation 06/06/2017   Dyspnea on exertion 05/05/2017   Asthma exacerbation in COPD ([place]) 05/05/2017   Morbid obesity due to excess calories ([place]) 05/05/2017   Chondromalacia, right knee 89/[birth year]/1941   Synovial plica of right knee 74/04/1447   Tear of medial meniscus of right knee, initial encounter 09/28/2016   OSA (obstructive sleep apnea) 09/23/2016   Chronic diastolic heart failure ([place]) 04/22/2016   Cigarette smoker 04/03/2014   Chest pain 04/03/2014   Lumbar spondylosis 12/28/2013   Extrinsic asthma 06/07/2013   Anxiety    Essential hypertension    Bipolar affective disorder (HCC)    GERD (gastroesophageal reflux disease)    Arthritis    Schizophrenia ([place])    Overactive bladder     Past Surgical History:  Procedure Laterality Date   BACK SURGERY     3 back surgeries   CHOLECYSTECTOMY     COLONOSCOPY WITH PROPOFOL N/A 01/23/2016   Procedure: COLONOSCOPY WITH PROPOFOL;  Surgeon: Wonda Horner, MD;  Location: [facility];  Service: Endoscopy;  Laterality: N/A;   EYE SURGERY     Metal plate in right eye. Had fracture  in right eye   gallstones reomved     KNEE ARTHROSCOPY WITH MENISCAL REPAIR Right 09/28/2016   Procedure: KNEE ARTHROSCOPY WITH MENISCAL REPAIR;  Surgeon: Dorna Leitz, MD;  Location: [place];  Service: Orthopedics;  Laterality: Right;  Right partial meniscectomy and chondroplasty, patellar/femoral joint and medial femoral condyle    LUMBAR LAMINECTOMY/DECOMPRESSION MICRODISCECTOMY  04/01/2012   Procedure: LUMBAR LAMINECTOMY/DECOMPRESSION MICRODISCECTOMY 2 LEVELS;  Surgeon: Faythe Ghee, MD;  Location: [facility] NEURO [facility]S;  Service: Neurosurgery;  Laterality: Left;  Lumbar four-five, lumbar five sacral one microdiscectomy    LUMBAR WOUND DEBRIDEMENT  04/29/2012   Procedure: LUMBAR WOUND DEBRIDEMENT;  Surgeon: Faythe Ghee, MD;   Location: [place] NEURO ORS;  Service: Neurosurgery;  Laterality: N/A;  lumbar wound debridement   ROTATOR CUFF REPAIR     Right shoulder   TUBAL LIGATION       OB History     Gravida  4   Para  2   Term  0   Preterm  2   AB  2   Living  2      SAB  1   IAB  1   Ectopic      Multiple      Live Births  2           Family History  Problem Relation Age of Onset   Diabetes Mother    Hypertension Mother    Diabetes Father    Heart disease Paternal Aunt    Cancer Paternal Aunt    Esophageal cancer Neg Hx    Stomach cancer Neg Hx     Social History   Tobacco Use   Smoking status: Former    Packs/day: 1.00    Years: 24.00    Pack years: 24.00    Types: Cigarettes    Quit date: 12/16/2020    Years since quitting: 0.2   Smokeless tobacco: Never   Tobacco comments:     last cigarette on 12/16/2020  Vaping Use   Vaping Use: Every day   Substances: Nicotine  Substance Use Topics   Alcohol use: No    Alcohol/week: 0.0 standard drinks    Comment: quit Nov. 2014   Drug use: Not Currently    Types: Cocaine    Home Medications Prior to Admission medications   Medication Sig Start Date End Date Taking? Authorizing Provider  ACCU-CHEK GUIDE test strip See admin instructions. 09/20/20   [provider]  albuterol (PROVENTIL) (2.5 MG/3ML) 0.083% nebulizer solution Take 2.5 mg by nebulization every 4 (four) hours as needed for wheezing or shortness of breath.    [provider]  albuterol (VENTOLIN HFA) 108 (90 Base) MCG/ACT inhaler Inhale 2 puffs into the lungs every 4 (four) hours as needed for wheezing or shortness of breath.    [provider]  ARIPiprazole (ABILIFY) 20 MG tablet Take 1 tablet (20 mg total) by mouth at bedtime. 03/04/15   Lance Bosch, NP  blood glucose meter kit and supplies KIT Dispense based on patient and insurance preference. Use up to four times daily as directed. (FOR ICD-9 250.00, 250.01). 08/31/19   Nita Sells, MD  Budeson-Glycopyrrol-Formoterol (BREZTRI AEROSPHERE) 160-9-4.8 MCG/ACT AERO Inhale 2 puffs into the lungs in the morning and at bedtime. 12/18/20   Rigoberto Noel, MD  DULoxetine (CYMBALTA) 30 MG capsule Take 30 mg by mouth 2 (two) times daily. 11/18/20   [provider]  gabapentin (NEURONTIN) 300 MG capsule Take 300 mg by  mouth 3 (three) times daily.  02/02/19   [provider]  glipiZIDE (GLUCOTROL XL) 10 MG 24 hr tablet Take 10 mg by mouth daily with breakfast.     [provider]  hydrocortisone 2.5 % cream Apply topically 3 (three) times daily. 03/06/21   [provider]  hydrOXYzine (ATARAX/VISTARIL) 25 MG tablet Take 25 mg by mouth 2 (two) times daily. 01/01/21   [provider]  metFORMIN (GLUCOPHAGE) 1000 MG tablet Take 1,000 mg by mouth 2 (two) times daily with a meal.     [provider]  metoprolol succinate (TOPROL-XL) 25 MG 24 hr tablet Take 25 mg by mouth daily. 11/15/20   [provider]  montelukast (SINGULAIR) 10 MG tablet Take 1 tablet (10 mg total) by mouth at bedtime. 09/20/19   Martyn Ehrich, NP  omeprazole (PRILOSEC) 40 MG capsule Take 1 capsule (40 mg total) by mouth daily. 12/14/17   [name], MD  oxybutynin (DITROPAN-XL) 10 MG 24 hr tablet Take 10 mg by mouth daily. 02/28/21   [provider]  Oxycodone HCl 10 MG TABS Take 10 mg by mouth every 6 (six) hours. 06/12/20   [provider]  phentermine (ADIPEX-P) 37.5 MG tablet Take 37.5 mg by mouth daily. 10/23/20   [provider]  Respiratory Therapy Supplies (FLUTTER) DEVI Use flutter device 3 times a day 07/20/18   Martyn Ehrich, NP  solifenacin (VESICARE) 5 MG tablet Take 5 mg by mouth daily. 02/28/21   [provider]  topiramate (TOPAMAX) 50 MG tablet Take 50 mg by mouth daily.    [provider]  torsemide (DEMADEX) 20 MG tablet Take 2 tablets (40 mg total) by mouth 2 (two) times daily. 07/17/20    Kilroy, Luke K, PA-C  TOVIAZ 4 MG TB24 tablet Take 4 mg by mouth daily. 11/18/20   [provider]  traZODone (DESYREL) 50 MG tablet Take 50 mg by mouth at bedtime. 11/15/20   [provider]  Triamcinolone Acetonide 0.025 % LOTN Apply 1 application topically 3 (three) times daily as needed (to affected Areas).     [provider]  triamterene-hydrochlorothiazide (MAXZIDE-25) 37.5-25 MG tablet Take 1 tablet by mouth daily. 12/25/20   [provider]  TRULICITY 8.89 VQ/9.4HW SOPN Inject 0.75 mg into the skin every Sunday. 09/26/20   [provider]  valsartan (DIOVAN) 80 MG tablet Take 80 mg by mouth daily.     [provider]  varenicline (CHANTIX) 0.5 MG tablet Take 1 tablet (0.5 mg total) by mouth daily for 3 days, THEN 1 tablet (0.5 mg total) 2 (two) times daily for 4 days, THEN 2 tablets (1 mg total) 2 (two) times daily for 23 days. 03/14/21 04/13/21  Scarlett Presto, MD  Vitamin D, Ergocalciferol, (DRISDOL) 1.25 MG (50000 UNIT) CAPS capsule Take 50,000 Units by mouth every Thursday. 10/23/20   [provider]    Allergies    Aspirin  Review of Systems   Review of Systems  Unable to perform ROS: Acuity of condition   Physical Exam Updated Vital Signs BP (!) 133/91   Pulse (!) 111   Temp (!) 96 F (35.6 C) (Temporal)   Resp (!) 23   Ht _0  (1.575 m)   Wt (!) 173.7 kg   LMP 10/15/2018 Comment: patient had pregnancy test with PCP (test was negative)  SpO2 100%   BMI 70.04 kg/m   Physical Exam Vitals and nursing note reviewed.  Constitutional:  General: She is in acute distress.     Appearance: She is ill-appearing.  HENT:     Head: Atraumatic.  Eyes:     Pupils: Pupils are equal, round, and reactive to light.  Cardiovascular:     Rate and Rhythm: Regular rhythm. Tachycardia present.  Pulmonary:     Effort: Accessory muscle usage, prolonged expiration and respiratory distress present.     Breath sounds: Decreased air  movement present.  Abdominal:     Palpations: Abdomen is soft.  Musculoskeletal:        General: No deformity. Normal range of motion.     Cervical back: Neck supple.     Right lower leg: Edema present.     Left lower leg: Edema present.  Skin:    General: Skin is warm.  Neurological:     Mental Status: She is confused.     Comments: Agitated and confused.  Disorganized verbal response to questions.  Does follow some commands and localize pain.    ED Results / Procedures / Treatments   Labs (all labs ordered are listed, but only abnormal results are displayed) Labs Reviewed  CBC WITH DIFFERENTIAL/PLATELET - Abnormal; Notable for the following components:      Result Value   WBC 26.2 (*)    MCV 105.5 (*)    All other components within normal limits  COMPREHENSIVE METABOLIC PANEL - Abnormal; Notable for the following components:   CO2 16 (*)    Glucose, Bld 341 (*)    Creatinine, Ser 1.21 (*)    Total Protein 6.3 (*)    Albumin 3.2 (*)    AST 102 (*)    ALT 96 (*)    GFR, Estimated 54 (*)    Anion gap 19 (*)    All other components within normal limits  LACTIC ACID, PLASMA - Abnormal; Notable for the following components:   Lactic Acid, Venous >11.0 (*)    All other components within normal limits  I-STAT CHEM 8, ED - Abnormal; Notable for the following components:   Creatinine, Ser 1.10 (*)    Glucose, Bld 335 (*)    TCO2 19 (*)    All other components within normal limits  TROPONIN I (HIGH SENSITIVITY) - Abnormal; Notable for the following components:   Troponin I (High Sensitivity) 44 (*)    All other components within normal limits  RESP PANEL BY RT-PCR (FLU A&B, COVID) ARPGX2  BRAIN NATRIURETIC PEPTIDE  RAPID URINE DRUG SCREEN, HOSP PERFORMED  ETHANOL  BETA-HYDROXYBUTYRIC ACID  AMMONIA  SALICYLATE LEVEL  ACETAMINOPHEN LEVEL  I-STAT CHEM 8, ED  I-STAT BETA HCG BLOOD, ED ([facility], [facility], [facility] ONLY)  I-STAT ARTERIAL BLOOD GAS, ED    EKG None  Radiology DG Chest  Port 1 View  Result Date: 03/28/2021 CLINICAL DATA:  50 year old female found unresponsive. Status post endotracheal tube placement. EXAM: PORTABLE CHEST 1 VIEW COMPARISON:  Chest x-ray 03/12/2021. FINDINGS: An endotracheal tube is in place with tip 4.3 cm above the carina. A nasogastric tube is seen extending into the stomach, however, the tip of the nasogastric tube extends below the lower margin of the image. Lung volumes are low. No consolidative airspace disease. No pleural effusions. No pneumothorax. No pulmonary nodule or mass noted. Pulmonary vasculature and the cardiomediastinal silhouette are within normal limits. IMPRESSION: 1. Support apparatus, as above. 2. Low lung volumes without radiographic evidence of acute cardiopulmonary disease. Electronically Signed   By: Vinnie Langton M.D.   On: 03/28/2021 07:27  Procedures Procedure Name: Intubation Date/Time: 03/28/2021 6:56 AM Performed by: Orpah Greek, MD Pre-anesthesia Checklist: Patient identified, Emergency Drugs available, Suction available, Timeout performed and Patient being monitored Oxygen Delivery Method: Ambu bag Preoxygenation: Pre-oxygenation with 100% oxygen Induction Type: Rapid sequence Ventilation: Mask ventilation with difficulty and Two handed mask ventilation required Laryngoscope Size: Glidescope Grade View: Grade I Tube size: 7.5 mm Number of attempts: 1 Placement Confirmation: ETT inserted through vocal cords under direct vision, Breath sounds checked- equal and bilateral and CO2 detector      Medications Ordered in ED Medications  propofol (DIPRIVAN) 1000 MG/100ML infusion (50 mcg/kg/min  117.9 kg Intravenous New Bag/Given 03/28/21 0712)  propofol (DIPRIVAN) 1000 MG/100ML infusion (has no administration in time range)  fentaNYL (SUBLIMAZE) 100 MCG/2ML injection (has no administration in time range)  fentaNYL 2558mg in NS 2538m(1056mml) infusion-PREMIX (25 mcg/hr Intravenous New Bag/Given  03/28/21 0758)  etomidate (AMIDATE) injection (20 mg Intravenous Given 03/28/21 0646)  fentaNYL (SUBLIMAZE) injection (200 mcg Intravenous Given 03/28/21 [id number]  ED Course  I have reviewed the triage vital signs and the nursing notes.  Pertinent labs & imaging results that were available during my care of the patient were reviewed by me and considered in my medical decision making (see chart for details).    MDM Rules/Calculators/A&P                          Patient presents to the emergency department in respiratory distress.  Not much is known at arrival.  Patient was reportedly at her normal state of health 10 minutes before collapsing.  EMS found her with decreased respirations and hypoxia.  Patient was bagged during transport.  At arrival she is markedly hypertensive, agitated.  It was felt that she required intubation to protect her airway as well as to facilitate work-up.  This was performed without difficulty.  Blood pressure improving after intubation and propofol drip.  Patient will be signed out to oncoming ER physician to follow-up.  At time of signout, CTs and lab work still pending.  CRITICAL CARE Performed by: ChrOrpah GreekTotal critical care time: 30 minutes  Critical care time was exclusive of separately billable procedures and treating other patients.  Critical care was necessary to treat or prevent imminent or life-threatening deterioration.  Critical care was time spent personally by me on the following activities: development of treatment plan with patient and/or surrogate as well as nursing, discussions with consultants, evaluation of patient's response to treatment, examination of patient, obtaining history from patient or surrogate, ordering and performing treatments and interventions, ordering and review of laboratory studies, ordering and review of radiographic studies, pulse oximetry and re-evaluation of patient's condition.   Final Clinical  Impression(s) / ED Diagnoses Final diagnoses:  Altered mental status, unspecified altered mental status type  Acute respiratory failure, unspecified whether with hypoxia or hypercapnia ([facility]  Rx / DC Orders ED Discharge Orders     None        Leibish Mcgregor, ChrGwenyth AllegraD 03/28/21 [id number]

## 2021-03-28 NOTE — Progress Notes (Signed)
eLink Physician-Brief Progress Note Patient Name: Lori Bean DOB: 1970-10-07 MRN: [id number]   Date of Service  03/28/2021  HPI/Events of Note  Bedside RN states patient is a difficult stick and they have not been able to draw labs tonight.  eICU Interventions  Will order a PICC line for blood drawing and medication delivery access.        Frederik Pear 03/28/2021, 9:54 PM

## 2021-03-28 NOTE — Progress Notes (Signed)
RT note: RT intubated patient first attempt with 7.5 at 24 @ lip. Direct visualization with 4 Lopro Glidescope. Positive ETCO2 detected, after pre oxygenation and ventilation with bag mask ventilation.

## 2021-03-28 NOTE — Progress Notes (Signed)
Patient transported to CT and back without complications. RN at bedside.  

## 2021-03-28 NOTE — Progress Notes (Signed)
Initial Nutrition Assessment  DOCUMENTATION CODES:   Morbid obesity  INTERVENTION:   If unable to extubate patient within the next 24 hours, recommend begin TF via OG tube: Vital High Protein at 55 ml/h (1320 ml per day)  Provides 1320 kcal (2695 kcal total with current propofol), 116 gm protein, 1104 ml free water daily.  NUTRITION DIAGNOSIS:   Inadequate oral intake related to inability to eat as evidenced by NPO status.  GOAL:   Provide needs based on ASPEN/SCCM guidelines  MONITOR:   Vent status, TF tolerance, Labs  REASON FOR ASSESSMENT:   Ventilator    ASSESSMENT:   50 yo female admitted with AMS requiring intubation in the ED. PMH includes asthma, OSA, smoker, anxiety, bipolar disorder, schizophrenia, DM2, GERD, HTN, morbid obesity.  S/P EEG to evaluate for seizure. No seizures seen per Neurology.   Patient is currently intubated on ventilator support MV: 12.5 L/min Temp (24hrs), Avg:96.4 F (35.8 C), Min:96 F (35.6 C), Max:96.8 F (36 C)  Propofol: 52.1 ml/hr providing 1375 kcal from lipid  Labs reviewed.  CBG: 134-118  Medications reviewed and include colace, novolog, lactulose, protonix, miralax.  Weight history reviewed.  Current weight 173.7 kg (? Accuracy) Usual weight range 115.7 - 122.4 kg since February 2022 Patient with moderate edema to BLE  NUTRITION - FOCUSED PHYSICAL EXAM:  Flowsheet Row Most Recent Value  Orbital Region No depletion  Upper Arm Region No depletion  Thoracic and Lumbar Region No depletion  Buccal Region Unable to assess  Temple Region No depletion  Clavicle Bone Region No depletion  Clavicle and Acromion Bone Region No depletion  Scapular Bone Region Unable to assess  Dorsal Hand No depletion  Patellar Region No depletion  Anterior Thigh Region No depletion  Posterior Calf Region No depletion  Edema (RD Assessment) Moderate  Hair Reviewed  Eyes Unable to assess  Mouth Unable to assess  Skin Reviewed   Nails Reviewed       Diet Order:   Diet Order             Diet NPO time specified  Diet effective now                   EDUCATION NEEDS:   Not appropriate for education at this time  Skin:  Skin Assessment: Reviewed RN Assessment  Last BM:  no BM documented  Height:   Ht [place] from Last 1 Encounters:  03/28/21 5\' 2"  (1.575 m)    Weight:   Wt Readings from Last 1 Encounters:  03/28/21 (!) 173.7 kg    Ideal Body Weight:  50 kg  BMI:  Body mass index is 70.04 kg/m.  Estimated Nutritional Needs:   Kcal:  1350-1700  Protein:  100-125 gm  Fluid:  >/= 1.7 L    Lucas Mallow, RD, LDN, CNSC Please refer to Amion for contact information.

## 2021-03-28 NOTE — Consult Note (Addendum)
NEUROHOSPITALISTS-CONSULTATION NOTE    Requesting Physician: Dr. Sherry Ruffing   Admit date: 03/28/21    Chief Complaint: Unresponsiveness    History obtained from:  Chart Review   HPI   ARLEATHA PHILIPPS is a 50 year old female w/pmh of asthma, OSA, smoking, HTN, CHF Grade 2 Diastolic Dysfunction who presents with unresponsiveness.  History below obtained from chart review/ED given patient is intubated and sedated with no family at bedside:   Patient brought to the emergency department by ambulance from home.  Patient reportedly collapsed at home prior to arrival.  EMS report that the significant other said that she was talking to him 10 minutes before and was fine.  He then found her unresponsive.  EMS report significant hypotension and respiratory distress during transport.  Breathing was assisted by bag-valve-mask during transport.  She was administered Narcan with minimal effect.  Level 5 caveat due to acuity.      Past Medical History    Past Medical History:  Diagnosis Date   Abdominal hernia    Anginal pain ([place])    occ from asthma   Anxiety    Arthritis    Asthma    Bipolar affective disorder ([place])    Schizophrenia   Bronchitis    hx of   CHF (congestive heart failure) ([place])    Depression    Diabetes mellitus without complication ([place])    " New Onset " per patient   GERD (gastroesophageal reflux disease)    Heart murmur    Hypertension    takes meds   Lymphedema 06/07/2018   Morbid (severe) obesity due to excess calories ([facility])    Neuropathy    left leg , "from back surgery"   Overactive bladder    Schizophrenia ([place])    Sciatica    Shortness of breath    Sleep apnea    Snoring disorder    Pt stated my boyfriend always wakes me up and tells me to breathe     Past Surgical History   Past Surgical History:  Procedure Laterality Date   BACK SURGERY     3 back surgeries   CHOLECYSTECTOMY     COLONOSCOPY WITH PROPOFOL N/A 01/23/2016    Procedure: COLONOSCOPY WITH PROPOFOL;  Surgeon: Wonda Horner, MD;  Location: [facility];  Service: Endoscopy;  Laterality: N/A;   EYE SURGERY     Metal plate in right eye. Had fracture in right eye   gallstones reomved     KNEE ARTHROSCOPY WITH MENISCAL REPAIR Right 09/28/2016   Procedure: KNEE ARTHROSCOPY WITH MENISCAL REPAIR;  Surgeon: Dorna Leitz, MD;  Location: [facility];  Service: Orthopedics;  Laterality: Right;  Right partial meniscectomy and chondroplasty, patellar/femoral joint and medial femoral condyle    LUMBAR LAMINECTOMY/DECOMPRESSION MICRODISCECTOMY  04/01/2012   Procedure: LUMBAR LAMINECTOMY/DECOMPRESSION MICRODISCECTOMY 2 LEVELS;  Surgeon: Faythe Ghee, MD;  Location: [facility];  Service: Neurosurgery;  Laterality: Left;  Lumbar four-five, lumbar five sacral one microdiscectomy    LUMBAR WOUND DEBRIDEMENT  04/29/2012   Procedure: LUMBAR WOUND DEBRIDEMENT;  Surgeon: Faythe Ghee, MD;  Location: [facility];  Service: Neurosurgery;  Laterality: N/A;  lumbar wound debridement   ROTATOR CUFF REPAIR     Right shoulder   TUBAL LIGATION       Family History   Family History  Problem Relation Age of Onset   Diabetes Mother    Hypertension Mother    Diabetes Father    Heart disease Paternal 2  Cancer Paternal Aunt    Esophageal cancer Neg Hx    Stomach cancer Neg Hx     Social History  Social History:  reports that she quit smoking about 3 months ago. Her smoking use included cigarettes. She has a 24.00 pack-year smoking history. She has never used smokeless tobacco. She reports previous drug use. Drug: Cocaine. She reports that she does not drink alcohol.   Allergies  Allergies:  Allergies  Allergen Reactions   Aspirin Nausea And Vomiting     Medications Prior to Admission    I have reviewed the patient's current medications.   ROS  ROS Unable to assess secondary to patient's mental status    Physical Examination  Gen: obese, well nourished, well  developed HEENT-  Normocephalic  Cardiovascular - Regular rate and rhythm  Respiratory -  Non-labored breathing, appears comfortable on vent Extremities- No edema or cyanosis Skin- Warm and dry   Neurological Examination  Mental Status: unresponsive, not following commands, clouded by sedation Cranial Nerves: Sluggish dolls eyes, + corneals, unable to assess facial symmetry due to ET tube, + cough, + gag  Motor: Normal bulk and tone. Moving all extremities with sedation paused Sensory: Intact to noxious stimulation  Deep Tendon Reflexes: Difficult to assess due to weight  Cerebellar: Unable to assess secondary to patient's mental status  Gait: Unable to assess secondary to patient's mental status    Pertinent Labs  03/28/21 Ammonia 189  03/28/21 Alcohol < 10  03/28/21 Lactic Acid > 11  03/28/21 Troponin 44    Pertinent Imaging    03/28/21 CT Head WO Contrast  No acute intracranial abnormality. Question bilateral periorbital soft tissue swelling.  Personally reviewed by attending MD  03/28/21 CT Cervical Spine WO Contrast  No acute cervical spine fracture  03/28/21 MRI Brain W WO Contrast  Pending   03/28/21 CTA Head and Neck  No LVO, hemodynamically significant stenosis, or evidence of dissection   Personally reviewed by attending MD    [place] is a 50 year old female w/pmh of asthma, OSA, smoking, HTN, CHF Grade 2 Diastolic Dysfunction who presents with unresponsiveness. EMS noted significant hypotension and respiratory distress during transport. Upon arrival to [facility] she was hypertensive however still in respiratory distress thus was intubated.  Work up so far is notable for lactic acid elevated > 11, elevated ammonia at 189. CTH w/NAICP, questionable periortibal soft tissue swelling.   Her neurological examination is non focal at this time.   Differential diagnoses include seizure, stroke, drug use ( given noted cocaine use in the past),  metabolic/infectious encephalopathy. Will proceed with CTA Head and Neck to rule out vascular abnormalities, MRI Brain W WO and routine EEG given concern for seizure. Also recommend to repeat lactic acid and ammonia given elevated levels. Primary team to continue to continue metabolic and infectious work up.   Neurology to continue to follow.   Recommendations:  - CTA Head and Neck (personally reviewed by attending physician,  - MRI Brain W WO Contrast  - Routine EEG  - B12 given increased MCV, TSH, thiamine, - Repeat ammonia, lactate to confirm accurate level and assess if rapidly clearing - Defer additional metabolic and infectious work up to primary team   Ruta Hinds, NP [facility] Nurse Practitioner  Patient seen and discussed with attending physician Dr. Curly Shores    Attending Neurologist's note:  I personally saw this patient, gathering history, performing a full neurologic examination, reviewing relevant labs, personally reviewing  relevant imaging including Head CT and CTA, and formulated the assessment and plan, adding the note above for completeness and clarity to accurately reflect my thoughts  Lesleigh Noe MD-PhD [facility] [phone]  Available 7 AM to 7 PM, outside these hours please contact Neurologist on call listed on AMION

## 2021-03-28 NOTE — Progress Notes (Signed)
Pharmacy Antibiotic Note  Lori Bean is a 50 y.o. female admitted on 03/28/2021 with respiratory distress and intubated upon arrival. Pharmacy has been consulted for vancomycin and cefepime dosing.  Plan: TBW 173.7kg, Scr 1.1, CrCl 95, Lactic Acid >11.0. Came in unresponsive requiring intubation. Ammonia severely elevated at 189.  Vancomycin 2000 mg IV load Vancomycin 1000 mg IV q12h (eAUC 522.0) Cefepime 2gram q8h   Monitor renal function, cx results, lactic acid, clinical progression, and LOT  Height: 5\' 2"  (157.5 cm) Weight: (!) 173.7 kg (382 lb 15 oz) IBW/kg (Calculated) : 50.1  Temp (24hrs), Avg:96 F (35.6 C), Min:96 F (35.6 C), Max:96 F (35.6 C)  Recent Labs  Lab 03/28/21 0653 03/28/21 0655  WBC 26.2*  --   CREATININE 1.21* 1.10*  LATICACIDVEN >11.0*  --     Estimated Creatinine Clearance: 95 mL/min (A) (by C-G formula based on SCr of 1.1 mg/dL (H)).    Allergies  Allergen Reactions   Aspirin Nausea And Vomiting    Antimicrobials this admission: 7/15 Vancomycin >> 7/15 Cefepime >>   Microbiology results: 7/15 BCx: Pending 7/15 UCx: Pending   Thank you for allowing pharmacy to be a part of this patient's care.  Marlowe Alt, PharmD Candidate 03/28/2021 9:01 AM

## 2021-03-28 NOTE — ED Provider Notes (Signed)
7:26 AM Care assumed from Dr. Betsey Holiday.  At time of transfer care, patient is awaiting on results of diagnostic work-up prior to admission likely to the ICU as patient is now intubated for airway protection and hypoxic respiratory failure.  According to previous team and EMS, patient was talking to husband then 10 minutes later was unresponsive.  Patient was hypoxic and bagged in transport and then was ultimately intubated shortly after arrival.  Patient was reportedly moving all extremities and Narcan did not seem to help.  Patient was hypertensive initially, patient waiting on head imaging to rule out acute intracranial bleed or other triggering abnormality.  Will call ICU after more work-up has been completed for admission.  8:36 AM Spoke with ICU who will come and the patient.  They requested broad-spectrum antibiotics which were ordered.  Patient's ammonia returned extremely elevated so there is likely a large component of hepatic encephalopathy causing her symptoms although her LFTs are only mildly elevated.  Lactic acid was elevated and so was white count so we did order the broad-spectrum antibiotics.  Tylenol level undetectable.  Alcohol detectable.  Troponin elevated, will trend.  Still waiting on results of CT head and C-spine.  ICU will admit.   CRITICAL CARE Performed by: Gwenyth Allegra Josceline Chenard Total critical care time: 35 minutes Critical care time was exclusive of separately billable procedures and treating other patients. Critical care was necessary to treat or prevent imminent or life-threatening deterioration. Critical care was time spent personally by me on the following activities: development of treatment plan with patient and/or surrogate as well as nursing, discussions with consultants, evaluation of patient's response to treatment, examination of patient, obtaining history from patient or surrogate, ordering and performing treatments and interventions, ordering and review of  laboratory studies, ordering and review of radiographic studies, pulse oximetry and re-evaluation of patient's condition.    Lashaya Kienitz, Gwenyth Allegra, MD 03/28/21 1556

## 2021-03-28 NOTE — Progress Notes (Signed)
EEG Completed; Results Pending  

## 2021-03-28 NOTE — Progress Notes (Addendum)
eLink Physician-Brief Progress Note Patient Name: Lori Bean DOB: Apr 14, 1971 MRN: [id number]   Date of Service  03/28/2021  HPI/Events of Note  Patient with ventilator dyssynchrony and needs additional sedation.  eICU Interventions  PRN Versed 1-2 mg  iv Q 4 hours added.        Kerry Kass Lori Bean 03/28/2021, 11:35 PM

## 2021-03-28 NOTE — Progress Notes (Signed)
Patient transported to CT and back to [facility] without complications. RN at bedside.

## 2021-03-28 NOTE — Progress Notes (Addendum)
eLink Physician-Brief Progress Note Patient Name: ROSELYNNE LORTZ DOB: 1971-07-03 MRN: [id number]   Date of Service  03/28/2021  HPI/Events of Note  Patient with difficult blood draw for labs, iv team is in the room and appears to be successfully drawing blood now.  eICU Interventions  Will defer further action for now, if iv team unsuccessful drawing all needed blood samples will order arterial line for blood drawing access / BP monitoring, as patient  lactic acid is elevated at 11. ADDENDUM Blood successfully drawn, if lactic acid is confirmed elevated will send patient for a CT  chest/abdomen/pelvis to r/o an acute process, if hypotensive an arterial line will be placed.        Kerry Kass Siddhanth Denk 03/28/2021, 11:05 PM

## 2021-03-28 NOTE — H&P (Signed)
NAME:  Lori Bean, MRN:  [id number], DOB:  1971-07-29, LOS: 0 ADMISSION DATE:  03/28/2021, CONSULTATION DATE:  7/15 REFERRING MD:  Dr. Sherry Ruffing, CHIEF COMPLAINT:  AMS   History of Present Illness:  Patient is a 50 yo F w/ PMH of asthma with frequent admissions this year for exacerbations, OSA (patient of Dr. [name]), smoker, anxiety, bipolar, schizophrenia, DM2, GERD, HTN, morbid obesity presents to [facility] on 7/15 with AMS.  On 7/15, patient's significant other found patient unresponsive. Was speaking to them 10 minutes prior. EMS called found patient unresponsive, hypotensive, and in respiratory distress. Breathing assisted with BVM. Administered Narcan.   7/15 ED course: patient was hypertensive, agitated, and still being assisted with BVM. Intubated and sedation started. BP improved post intubation. CT head negative. Neuro consulted and ordered MRI. UDS, UA pending. Bcx2 pending. ABG 7.16, 70, 382, 25.7. Increased RR on mech vent to 28. Glucose 341, co2 16, creat 1.21, anion gap 19. Elevated Lfts, Ammonia 189. Lactic acid >11. WBC 26. Afebrile. Troponin 44.  PCCM consulted for admission and medical management.  Pertinent  Medical History   Past Medical History:  Diagnosis Date   Abdominal hernia    Anginal pain ([place])    occ from asthma   Anxiety    Arthritis    Asthma    Bipolar affective disorder ([place])    Schizophrenia   Bronchitis    hx of   CHF (congestive heart failure) ([place])    Depression    Diabetes mellitus without complication ([place])    " New Onset " per patient   GERD (gastroesophageal reflux disease)    Heart murmur    Hypertension    takes meds   Lymphedema 06/07/2018   Morbid (severe) obesity due to excess calories (H[facility])    Neuropathy    left leg , "from back surgery"   Overactive bladder    Schizophrenia ([place])    Sciatica    Shortness of breath    Sleep apnea    Snoring disorder    Pt stated my boyfriend always wakes me up and tells me to breathe      Significant Hospital Events: Including procedures, antibiotic start and stop dates in addition to other pertinent events   7/15: admitted to [facility] for acute metabolic encephalopahty; intubated; CT head  Interim History / Subjective:   Patient Intubated; ABG resp acidosis on RR of 28; appears to be air trapping. Breath stacking on vent. Patient sedated on 60 prop and 100 fentanyl. No response. Pupils 2 mm bilaterally; sluggish to light.   Objective   Blood pressure 126/89, pulse (!) 116, temperature (!) 96 F (35.6 C), temperature source Temporal, resp. rate (!) 22, height _0  (1.575 m), weight (!) 173.7 kg, last menstrual period 10/15/2018, SpO2 100 %.    Vent Mode: PRVC FiO2 (%):  [50 %-100 %] 50 % Set Rate:  [25 bmp-28 bmp] 28 bmp Vt Set:  [400 mL] 400 mL PEEP:  [5 cmH20] 5 cmH20 Plateau Pressure:  [28 cmH20] 28 cmH20  No intake or output data in the 24 hours ending 03/28/21 0858 Filed Weights   03/28/21 0700 03/28/21 6010  Weight: 117.9 kg (!) 173.7 kg    Examination: General:  critically ill, obese female; intubated  HEENT: MM pink/moist, ETT in place Neuro: sedated; pupil sluggish to light, bilateral 19m CV: s1s2, sinus tach, no m/r/g PULM:  dim rhonchi bilaterally; on PRVC 40% with sats 98% GI: soft, bsx4 active  Extremities: warm/dry  Skin: no rashes or lesions  CXR: low lung volumes; ETT in good position. OG tube in stomach.  CT head negative MRI pending  Resolved Hospital Problem list     Assessment & Plan:  Acute metabolic encephalopathy: likely due to hypercarbia; UDS pending; MRI pending; CT head negative Hyperammonemia; Elevated LFTs Lactic acidosis P: -Admit to ICU for telemetry monitoring -Trend ABG; adjust vent as needed -start lactulose; consider removing as mental status improves -trend lactate, ammonia, CMP -LR bolus given -Neuro following: CT head negative; MRI pending -consider weaning off sedation after MRI and avoid sedative  medications -Bcx2 pending; continue ppx antibiotics  Acute respiratory failure with hypercapnea S/p intubated on mechanical ventilation Hx of asthma, OSA: on breztri, patient of Dr. [name] Smoker : on chantix P: -continue mech ventilation: increased RR to 30 and decreased I time 0.7 -repeat ABG 2 hours -continue sedation for RASS 0 to -1 -VAP prevention in place -Duoneb q6 and pulmicort q12 -singulair -nicotine patch  Hyperglycemia DM2 P: -SSI and CBG monitoring -A1C ordered  Leukocytosis P: -Trend CBC/fever -Bcx2 sent -will stop cefepime for questionable seizure and switched to ceftriaxone. Continue flagyl and vanc  Elevated troponin P: -trend troponin  Chronic HTN P: -hold home meds; prn hydralazine ordered -consider restarting tomorrow  GERD P: -PPI  Hx of bipolar, schizo P: -hold abilify, duloxetine, topiramate -consider restarting tomorrow  Best Practice (right click and "Reselect all SmartList Selections" daily)   Diet/type: NPO w/ meds via tube DVT prophylaxis: prophylactic heparin  GI prophylaxis: PPI Lines: N/A Foley:  N/A Code Status:  full code Last date of multidisciplinary goals of care discussion [pending]  Labs   CBC: Recent Labs  Lab 03/28/21 0653 03/28/21 0655 03/28/21 0829  WBC 26.2*  --   --   NEUTROABS 10.5*  --   --   HGB 13.9 15.0 14.3  HCT 45.7 44.0 42.0  MCV 105.5*  --   --   PLT 250  --   --     Basic Metabolic Panel: Recent Labs  Lab 03/28/21 0653 03/28/21 0655 03/28/21 0829  NA 136 135 137  K 5.1 5.0 3.9  CL 101 105  --   CO2 16*  --   --   GLUCOSE 341* 335*  --   BUN 13 15  --   CREATININE 1.21* 1.10*  --   CALCIUM 9.1  --   --    GFR: Estimated Creatinine Clearance: 95 mL/min (A) (by C-G formula based on SCr of 1.1 mg/dL (H)). Recent Labs  Lab 03/28/21 0653  WBC 26.2*  LATICACIDVEN >11.0*    Liver Function Tests: Recent Labs  Lab 03/28/21 0653  AST 102*  ALT 96*  ALKPHOS 80  BILITOT 0.5   PROT 6.3*  ALBUMIN 3.2*   No results for input(s): LIPASE, AMYLASE in the last 168 hours. Recent Labs  Lab 03/28/21 0653  AMMONIA 189*    ABG    Component Value Date/Time   PHART 7.161 (LL) 03/28/2021 0829   PCO2ART 70.4 (HH) 03/28/2021 0829   PO2ART 382 (H) 03/28/2021 0829   HCO3 25.7 03/28/2021 0829   TCO2 28 03/28/2021 0829   ACIDBASEDEF 5.0 (H) 03/28/2021 0829   O2SAT 100.0 03/28/2021 0829     Coagulation Profile: No results for input(s): INR, PROTIME in the last 168 hours.  Cardiac Enzymes: No results for input(s): CKTOTAL, CKMB, CKMBINDEX, TROPONINI in the last 168 hours.  HbA1C: Hgb A1c MFr Bld  Date/Time Value Ref Range Status  12/16/2020 12:50 AM 6.6 (H) 4.8 - 5.6 % Final    Comment:    (NOTE) Pre diabetes:          5.7%-6.4%  Diabetes:              >6.4%  Glycemic control for   <7.0% adults with diabetes   06/28/2020 11:27 AM 6.4 (H) 4.8 - 5.6 % Final    Comment:    (NOTE)         Prediabetes: 5.7 - 6.4         Diabetes: >6.4         Glycemic control for adults with diabetes: <7.0     CBG: No results for input(s): GLUCAP in the last 168 hours.  Review of Systems:   Unable to obtain from patient due to intubated and sedated. Obtained information from chart and nurse.  Past Medical History:  She,  has a past medical history of Abdominal hernia, Anginal pain ([place]), Anxiety, Arthritis, Asthma, Bipolar affective disorder ([place]), Bronchitis, CHF (congestive heart failure) ([place]), Depression, Diabetes mellitus without complication ([place]), GERD (gastroesophageal reflux disease), Heart murmur, Hypertension, Lymphedema (06/07/2018), Morbid (severe) obesity due to excess calories ([place]), Neuropathy, Overactive bladder, Schizophrenia ([place]), Sciatica, Shortness of breath, Sleep apnea, and Snoring disorder.   Surgical History:   Past Surgical History:  Procedure Laterality Date   BACK SURGERY     3 back surgeries   CHOLECYSTECTOMY     COLONOSCOPY WITH  PROPOFOL N/A 01/23/2016   Procedure: COLONOSCOPY WITH PROPOFOL;  Surgeon: Wonda Horner, MD;  Location: [facility];  Service: Endoscopy;  Laterality: N/A;   EYE SURGERY     Metal plate in right eye. Had fracture in right eye   gallstones reomved     KNEE ARTHROSCOPY WITH MENISCAL REPAIR Right 09/28/2016   Procedure: KNEE ARTHROSCOPY WITH MENISCAL REPAIR;  Surgeon: Dorna Leitz, MD;  Location: [facility];  Service: Orthopedics;  Laterality: Right;  Right partial meniscectomy and chondroplasty, patellar/femoral joint and medial femoral condyle    LUMBAR LAMINECTOMY/DECOMPRESSION MICRODISCECTOMY  04/01/2012   Procedure: LUMBAR LAMINECTOMY/DECOMPRESSION MICRODISCECTOMY 2 LEVELS;  Surgeon: Faythe Ghee, MD;  Location: [facility];  Service: Neurosurgery;  Laterality: Left;  Lumbar four-five, lumbar five sacral one microdiscectomy    LUMBAR WOUND DEBRIDEMENT  04/29/2012   Procedure: LUMBAR WOUND DEBRIDEMENT;  Surgeon: Faythe Ghee, MD;  Location: [facility];  Service: Neurosurgery;  Laterality: N/A;  lumbar wound debridement   ROTATOR CUFF REPAIR     Right shoulder   TUBAL LIGATION       Social History:   reports that she quit smoking about 3 months ago. Her smoking use included cigarettes. She has a 24.00 pack-year smoking history. She has never used smokeless tobacco. She reports previous drug use. Drug: Cocaine. She reports that she does not drink alcohol.   Family History:  Her family history includes Cancer in her paternal aunt; Diabetes in her father and mother; Heart disease in her paternal aunt; Hypertension in her mother. There is no history of Esophageal cancer or Stomach cancer.   Allergies Allergies  Allergen Reactions   Aspirin Nausea And Vomiting     Home Medications  Prior to Admission medications   Medication Sig Start Date End Date Taking? Authorizing Provider  ACCU-CHEK GUIDE test strip See admin instructions. 09/20/20   [provider]  albuterol (PROVENTIL) (2.5  MG/3ML) 0.083% nebulizer solution Take 2.5 mg by nebulization every 4 (four) hours as needed for wheezing or  shortness of breath.    [provider]  albuterol (VENTOLIN HFA) 108 (90 Base) MCG/ACT inhaler Inhale 2 puffs into the lungs every 4 (four) hours as needed for wheezing or shortness of breath.    [provider]  ARIPiprazole (ABILIFY) 20 MG tablet Take 1 tablet (20 mg total) by mouth at bedtime. 03/04/15   Lance Bosch, NP  blood glucose meter kit and supplies KIT Dispense based on patient and insurance preference. Use up to four times daily as directed. (FOR ICD-9 250.00, 250.01). 08/31/19   Nita Sells, MD  Budeson-Glycopyrrol-Formoterol (BREZTRI AEROSPHERE) 160-9-4.8 MCG/ACT AERO Inhale 2 puffs into the lungs in the morning and at bedtime. 12/18/20   Rigoberto Noel, MD  DULoxetine (CYMBALTA) 30 MG capsule Take 30 mg by mouth 2 (two) times daily. 11/18/20   [provider]  gabapentin (NEURONTIN) 300 MG capsule Take 300 mg by mouth 3 (three) times daily.  02/02/19   [provider]  glipiZIDE (GLUCOTROL XL) 10 MG 24 hr tablet Take 10 mg by mouth daily with breakfast.     [provider]  hydrocortisone 2.5 % cream Apply topically 3 (three) times daily. 03/06/21   [provider]  hydrOXYzine (ATARAX/VISTARIL) 25 MG tablet Take 25 mg by mouth 2 (two) times daily. 01/01/21   [provider]  metFORMIN (GLUCOPHAGE) 1000 MG tablet Take 1,000 mg by mouth 2 (two) times daily with a meal.     [provider]  metoprolol succinate (TOPROL-XL) 25 MG 24 hr tablet Take 25 mg by mouth daily. 11/15/20   [provider]  montelukast (SINGULAIR) 10 MG tablet Take 1 tablet (10 mg total) by mouth at bedtime. 09/20/19   Martyn Ehrich, NP  omeprazole (PRILOSEC) 40 MG capsule Take 1 capsule (40 mg total) by mouth daily. 12/14/17   [name], MD  oxybutynin (DITROPAN-XL) 10 MG 24 hr tablet Take 10 mg by mouth daily.  02/28/21   [provider]  Oxycodone HCl 10 MG TABS Take 10 mg by mouth every 6 (six) hours. 06/12/20   [provider]  phentermine (ADIPEX-P) 37.5 MG tablet Take 37.5 mg by mouth daily. 10/23/20   [provider]  Respiratory Therapy Supplies (FLUTTER) DEVI Use flutter device 3 times a day 07/20/18   Martyn Ehrich, NP  solifenacin (VESICARE) 5 MG tablet Take 5 mg by mouth daily. 02/28/21   [provider]  topiramate (TOPAMAX) 50 MG tablet Take 50 mg by mouth daily.    [provider]  torsemide (DEMADEX) 20 MG tablet Take 2 tablets (40 mg total) by mouth 2 (two) times daily. 07/17/20   Kilroy, Luke K, PA-C  TOVIAZ 4 MG TB24 tablet Take 4 mg by mouth daily. 11/18/20   [provider]  traZODone (DESYREL) 50 MG tablet Take 50 mg by mouth at bedtime. 11/15/20   [provider]  Triamcinolone Acetonide 0.025 % LOTN Apply 1 application topically 3 (three) times daily as needed (to affected Areas).     [provider]  triamterene-hydrochlorothiazide (MAXZIDE-25) 37.5-25 MG tablet Take 1 tablet by mouth daily. 12/25/20   [provider]  TRULICITY 5.46 TK/3.5WS SOPN Inject 0.75 mg into the skin every Sunday. 09/26/20   [provider]  valsartan (DIOVAN) 80 MG tablet Take 80 mg by mouth daily.     [provider]  varenicline (CHANTIX) 0.5 MG tablet Take 1 tablet (0.5 mg total) by mouth daily for 3 days, THEN 1 tablet (  0.5 mg total) 2 (two) times daily for 4 days, THEN 2 tablets (1 mg total) 2 (two) times daily for 23 days. 03/14/21 04/13/21  Scarlett Presto, MD  Vitamin D, Ergocalciferol, (DRISDOL) 1.25 MG (50000 UNIT) CAPS capsule Take 50,000 Units by mouth every Thursday. 10/23/20   [provider]     Critical care time: [place], PA-C [facility] 03/28/2021, 8:58 AM  Please see [email or web address] for pager details.  From 7A-7P if no response, please call [phone]. After  hours, please call ELink [phone].

## 2021-03-28 NOTE — ED Triage Notes (Signed)
Pt arrived with EMS unresponsive. Per EMS, boyfriend called after patient became unresponsive at home, pupils 2, faint radials; pt given 2mg  narcan pta without improvement, nasal trumpet in place. On arrival EMS assisting ventilations with BVM. Rales/rhonchi reported

## 2021-03-28 NOTE — Progress Notes (Signed)
Patient transported to 2M12 from ED without complications. RN at bedside.

## 2021-03-28 NOTE — Procedures (Signed)
Patient Name: Lori Bean  MRN: [id number]  Epilepsy Attending: Lora Havens  Referring Physician/Provider: Dr Lesleigh Noe Date: 03/28/2021 Duration: 23.03 mins  Patient history: a 50 year old female who presents with unresponsiveness. EEG to evaluate for seizure  Level of alertness: lethargic  AEDs during EEG study:  None  Technical aspects: This EEG study was done with scalp electrodes positioned according to the 10-20 International system of electrode placement. Electrical activity was acquired at a sampling rate of 500Hz  and reviewed with a high frequency filter of 70Hz  and a low frequency filter of 1Hz . EEG data were recorded continuously and digitally stored.   Description: No clear posterior dominant rhythm was seen. EEG showed continuous generalized 5-9hz  theta-alpha as well as intermittent generalized 2-3hz  delta slowing. Hyperventilation and photic stimulation were not performed.     ABNORMALITY - Continuous slow, generalized  IMPRESSION: This study is suggestive of moderate diffuse encephalopathy, nonspecific etiology but likely related to sedation. No seizures or epileptiform discharges were seen throughout the recording.  Lori Bean

## 2021-03-29 ENCOUNTER — Inpatient Hospital Stay (HOSPITAL_COMMUNITY): Payer: Medicaid Other

## 2021-03-29 DIAGNOSIS — I248 Other forms of acute ischemic heart disease: Secondary | ICD-10-CM

## 2021-03-29 DIAGNOSIS — G9341 Metabolic encephalopathy: Secondary | ICD-10-CM | POA: Diagnosis not present

## 2021-03-29 DIAGNOSIS — I214 Non-ST elevation (NSTEMI) myocardial infarction: Secondary | ICD-10-CM

## 2021-03-29 DIAGNOSIS — J9601 Acute respiratory failure with hypoxia: Secondary | ICD-10-CM | POA: Diagnosis not present

## 2021-03-29 DIAGNOSIS — J9602 Acute respiratory failure with hypercapnia: Secondary | ICD-10-CM | POA: Diagnosis not present

## 2021-03-29 LAB — POCT I-STAT 7, (LYTES, BLD GAS, ICA,H+H)
Acid-base deficit: 4 mmol/L — ABNORMAL HIGH (ref 0.0–2.0)
Acid-base deficit: 3 mmol/L — ABNORMAL HIGH (ref 0.0–2.0)
Bicarbonate: 23.5 mmol/L (ref 20.0–28.0)
Bicarbonate: 23.3 mmol/L (ref 20.0–28.0)
Calcium, Ion: 1.21 mmol/L (ref 1.15–1.40)
Calcium, Ion: 1.22 mmol/L (ref 1.15–1.40)
HCT: 34 % — ABNORMAL LOW (ref 36.0–46.0)
HCT: 38 % (ref 36.0–46.0)
Hemoglobin: 11.6 g/dL — ABNORMAL LOW (ref 12.0–15.0)
Hemoglobin: 12.9 g/dL (ref 12.0–15.0)
O2 Saturation: 96 %
O2 Saturation: 92 %
Patient temperature: 99.3
Patient temperature: 99.4
Potassium: 3.3 mmol/L — ABNORMAL LOW (ref 3.5–5.1)
Potassium: 3.3 mmol/L — ABNORMAL LOW (ref 3.5–5.1)
Sodium: 140 mmol/L (ref 135–145)
Sodium: 141 mmol/L (ref 135–145)
TCO2: 25 mmol/L (ref 22–32)
TCO2: 25 mmol/L (ref 22–32)
pCO2 arterial: 51.1 mmHg — ABNORMAL HIGH (ref 32.0–48.0)
pCO2 arterial: 46.9 mmHg (ref 32.0–48.0)
pH, Arterial: 7.272 — ABNORMAL LOW (ref 7.350–7.450)
pH, Arterial: 7.306 — ABNORMAL LOW (ref 7.350–7.450)
pO2, Arterial: 92 mmHg (ref 83.0–108.0)
pO2, Arterial: 72 mmHg — ABNORMAL LOW (ref 83.0–108.0)

## 2021-03-29 LAB — ECHOCARDIOGRAM COMPLETE
AR max vel: 2.13 cm2
AV Area VTI: 1.92 cm2
AV Area mean vel: 1.95 cm2
AV Mean grad: 8 mmHg
AV Peak grad: 15.1 mmHg
Ao pk vel: 1.94 m/s
Area-P 1/2: 2.83 cm2
Height: 62 in
S' Lateral: 2.9 cm
Weight: 6127.02 oz

## 2021-03-29 LAB — TROPONIN I (HIGH SENSITIVITY)
Troponin I (High Sensitivity): 1735 ng/L (ref <18)
Troponin I (High Sensitivity): 913 ng/L (ref <18)
Troponin I (High Sensitivity): 576 ng/L (ref <18)

## 2021-03-29 LAB — GLUCOSE, CAPILLARY
Glucose-Capillary: 121 mg/dL — ABNORMAL HIGH (ref 70–99)
Glucose-Capillary: 136 mg/dL — ABNORMAL HIGH (ref 70–99)
Glucose-Capillary: 144 mg/dL — ABNORMAL HIGH (ref 70–99)
Glucose-Capillary: 164 mg/dL — ABNORMAL HIGH (ref 70–99)
Glucose-Capillary: 170 mg/dL — ABNORMAL HIGH (ref 70–99)
Glucose-Capillary: 188 mg/dL — ABNORMAL HIGH (ref 70–99)

## 2021-03-29 LAB — HEPARIN LEVEL (UNFRACTIONATED): Heparin Unfractionated: 0.32 IU/mL (ref 0.30–0.70)

## 2021-03-29 LAB — TSH: TSH: 0.634 u[IU]/mL (ref 0.350–4.500)

## 2021-03-29 LAB — VITAMIN B12: Vitamin B-12: 385 pg/mL (ref 180–914)

## 2021-03-29 LAB — TRIGLYCERIDES: Triglycerides: 533 mg/dL — ABNORMAL HIGH (ref <150)

## 2021-03-29 MED ORDER — POTASSIUM CHLORIDE 10 MEQ/50ML IV SOLN
10.0000 meq | INTRAVENOUS | Status: AC
  Administered 2021-03-29 (×2): 10 meq via INTRAVENOUS
  Filled 2021-03-29: qty 50

## 2021-03-29 MED ORDER — POTASSIUM CHLORIDE 10 MEQ/50ML IV SOLN
10.0000 meq | INTRAVENOUS | Status: AC
  Administered 2021-03-29 (×2): 10 meq via INTRAVENOUS
  Filled 2021-03-29 (×3): qty 50

## 2021-03-29 MED ORDER — CHLORHEXIDINE GLUCONATE CLOTH 2 % EX PADS
6.0000 | MEDICATED_PAD | Freq: Every day | CUTANEOUS | Status: DC
  Administered 2021-03-30 – 2021-03-31 (×3): 6 via TOPICAL

## 2021-03-29 MED ORDER — ASPIRIN 325 MG PO TABS
325.0000 mg | ORAL_TABLET | Freq: Every day | ORAL | Status: DC
  Administered 2021-03-29: 325 mg
  Filled 2021-03-29: qty 1

## 2021-03-29 MED ORDER — CYANOCOBALAMIN 500 MCG PO TABS
500.0000 ug | ORAL_TABLET | Freq: Every day | ORAL | Status: DC
  Administered 2021-03-30: 500 ug
  Filled 2021-03-29 (×2): qty 1

## 2021-03-29 MED ORDER — POTASSIUM CHLORIDE 20 MEQ PO PACK
20.0000 meq | PACK | ORAL | Status: AC
  Administered 2021-03-29 (×2): 20 meq
  Filled 2021-03-29 (×2): qty 1

## 2021-03-29 MED ORDER — ASPIRIN 81 MG PO CHEW
81.0000 mg | CHEWABLE_TABLET | Freq: Every day | ORAL | Status: DC
  Administered 2021-03-30: 81 mg
  Filled 2021-03-29: qty 1

## 2021-03-29 MED ORDER — GADOBUTROL 1 MMOL/ML IV SOLN
10.0000 mL | Freq: Once | INTRAVENOUS | Status: AC | PRN
  Administered 2021-03-29: 10 mL via INTRAVENOUS

## 2021-03-29 MED ORDER — SODIUM CHLORIDE 0.9 % IV SOLN
INTRAVENOUS | Status: DC | PRN
  Administered 2021-03-29: 250 mL via INTRAVENOUS

## 2021-03-29 MED ORDER — HEPARIN (PORCINE) 25000 UT/250ML-% IV SOLN
2000.0000 [IU]/h | INTRAVENOUS | Status: DC
  Administered 2021-03-29 – 2021-03-30 (×2): 1350 [IU]/h via INTRAVENOUS
  Administered 2021-03-30: 1650 [IU]/h via INTRAVENOUS
  Filled 2021-03-29 (×3): qty 250

## 2021-03-29 MED ORDER — ATORVASTATIN CALCIUM 40 MG PO TABS
80.0000 mg | ORAL_TABLET | Freq: Every day | ORAL | Status: DC
  Administered 2021-03-29 – 2021-03-30 (×2): 80 mg
  Filled 2021-03-29 (×2): qty 2

## 2021-03-29 MED ORDER — DEXMEDETOMIDINE HCL IN NACL 400 MCG/100ML IV SOLN
0.4000 ug/kg/h | INTRAVENOUS | Status: DC
  Administered 2021-03-29 (×2): 0.8 ug/kg/h via INTRAVENOUS
  Administered 2021-03-29: 0.4 ug/kg/h via INTRAVENOUS
  Administered 2021-03-30 (×3): 0.8 ug/kg/h via INTRAVENOUS
  Filled 2021-03-29 (×3): qty 100
  Filled 2021-03-29: qty 200
  Filled 2021-03-29: qty 100

## 2021-03-29 MED ORDER — PROPOFOL 1000 MG/100ML IV EMUL
INTRAVENOUS | Status: AC
  Administered 2021-03-29: 45 ug/kg/min via INTRAVENOUS
  Filled 2021-03-29: qty 100

## 2021-03-29 MED ORDER — PROPOFOL 1000 MG/100ML IV EMUL
5.0000 ug/kg/min | INTRAVENOUS | Status: DC
  Administered 2021-03-29: 55 ug/kg/min via INTRAVENOUS
  Administered 2021-03-29: 60 ug/kg/min via INTRAVENOUS
  Administered 2021-03-29 (×3): 55 ug/kg/min via INTRAVENOUS
  Filled 2021-03-29 (×2): qty 100
  Filled 2021-03-29: qty 200
  Filled 2021-03-29: qty 100

## 2021-03-29 MED ORDER — HEPARIN BOLUS VIA INFUSION
4000.0000 [IU] | Freq: Once | INTRAVENOUS | Status: AC
  Administered 2021-03-29: 4000 [IU] via INTRAVENOUS
  Filled 2021-03-29: qty 4000

## 2021-03-29 NOTE — Progress Notes (Signed)
Pt had CVC placed overnight, Felicia RN to speak with MD re need for PICC.

## 2021-03-29 NOTE — Progress Notes (Addendum)
Cardiology Progress Note  Patient ID: Lori Bean MRN: [id number] DOB: 11-16-1970 Date of Encounter: 03/29/2021  Primary Cardiologist: [name], MD  Subjective   Chief Complaint: None.  HPI: Admitted overnight with hypercarbic respiratory failure secondary to asthma exacerbation.  Found to have severe lactic acidosis.  Cardiology consulted for elevated troponin.  She remains critically ill on the ventilator.  She is unable to provide any answers.  Nursing reports she is hemodynamically stable.  ROS:  All other ROS reviewed and negative. Pertinent positives noted in the HPI.     Inpatient Medications  Scheduled Meds:  [START ON 03/30/2021] aspirin  81 mg Per Tube Daily   atorvastatin  80 mg Per Tube Daily   budesonide (PULMICORT) nebulizer solution  0.25 mg Nebulization BID   chlorhexidine gluconate (MEDLINE KIT)  15 mL Mouth Rinse BID   Chlorhexidine Gluconate Cloth  6 each Topical Daily   docusate  100 mg Per Tube BID   insulin aspart  0-15 Units Subcutaneous Q4H   ipratropium-albuterol  3 mL Nebulization Q6H   lactulose  10 g Per Tube BID   mouth rinse  15 mL Mouth Rinse 10 times per day   montelukast  10 mg Per Tube QHS   nicotine  7 mg Transdermal Daily   pantoprazole sodium  40 mg Per Tube Daily   polyethylene glycol  17 g Per Tube Daily   potassium chloride  20 mEq Per Tube Q4H   Continuous Infusions:  sodium chloride 250 mL (03/29/21 0846)   cefTRIAXone (ROCEPHIN)  IV     dexmedetomidine (PRECEDEX) IV infusion Stopped (03/29/21 0423)   fentaNYL infusion INTRAVENOUS 200 mcg/hr (03/29/21 0800)   heparin 1,350 Units/hr (03/29/21 0800)   potassium chloride 10 mEq (03/29/21 1005)   propofol (DIPRIVAN) infusion 55 mcg/kg/min (03/29/21 0937)   vancomycin Stopped (03/28/21 2213)   PRN Meds: sodium chloride, acetaminophen, docusate, fentaNYL, hydrALAZINE, midazolam, polyethylene glycol   Vital Signs   Vitals:   03/29/21 0700 03/29/21 0822 03/29/21 0823  03/29/21 0825  BP: (!) 114/59     Pulse: 99   93  Resp: 17   (!) 30  Temp: 99.4 F (37.4 C)     TempSrc: Oral     SpO2: 97% 94% 94% 94%  Weight:      Height:        Intake/Output Summary (Last 24 hours) at 03/29/2021 1057 Last data filed at 03/29/2021 0800 Gross per 24 hour  Intake 1997.85 ml  Output 1300 ml  Net 697.85 ml   Last 3 Weights 03/28/2021 03/28/2021 03/14/2021  Weight (lbs) 382 lb 15 oz 260 lb 260 lb 1.6 oz  Weight (kg) 173.7 kg 117.935 kg 117.981 kg      Telemetry  Overnight telemetry shows sinus rhythm in the 90s, which I personally reviewed.   ECG  The most recent ECG shows normal sinus rhythm heart rate 86, no acute ischemic changes or evidence of infarction, which I personally reviewed.   Physical Exam   Vitals:   03/29/21 0700 03/29/21 0822 03/29/21 0823 03/29/21 0825  BP: (!) 114/59     Pulse: 99   93  Resp: 17   (!) 30  Temp: 99.4 F (37.4 C)     TempSrc: Oral     SpO2: 97% 94% 94% 94%  Weight:      Height:        Intake/Output Summary (Last 24 hours) at 03/29/2021 1057 Last data filed at 03/29/2021 0800 Gross per 24  hour  Intake 1997.85 ml  Output 1300 ml  Net 697.85 ml    Last 3 Weights 03/28/2021 03/28/2021 03/14/2021  Weight (lbs) 382 lb 15 oz 260 lb 260 lb 1.6 oz  Weight (kg) 173.7 kg 117.935 kg 117.981 kg    Body mass index is 70.04 kg/m.  General: Ill-appearing on the ventilator Head: Atraumatic, normal size  Eyes: PEERLA Neck: Supple, no JVD Endocrine: No thryomegaly Cardiac: Normal S1, S2; RRR; no murmurs, rubs, or gallops Lungs: Diminished breath sounds bilaterally, wheezing noted Abd: Soft, nontender, no hepatomegaly  Ext: No edema, pulses 2+ Musculoskeletal: No deformities Skin: Warm and dry, no rashes   Neuro: Alert, awake, not able to answer questions as she is on a ventilator, moving all extremities spontaneously  Labs  High Sensitivity Troponin:   Recent Labs  Lab 03/12/21 1822 03/28/21 0653 03/28/21 2315  03/29/21 0608  TROPONINIHS 4 44* 1,735* 913*     Cardiac EnzymesNo results for input(s): TROPONINI in the last 168 hours. No results for input(s): TROPIPOC in the last 168 hours.  Chemistry Recent Labs  Lab 03/28/21 0653 03/28/21 0655 03/28/21 0829 03/28/21 2315 03/29/21 0523 03/29/21 1015  NA 136 135   < > 140 140 141  K 5.1 5.0   < > 3.5 3.3* 3.3*  CL 101 105  --  105  --   --   CO2 16*  --   --  26  --   --   GLUCOSE 341* 335*  --  131*  --   --   BUN 13 15  --  13  --   --   CREATININE 1.21* 1.10*  --  1.00  --   --   CALCIUM 9.1  --   --  9.0  --   --   PROT 6.3*  --   --  6.4*  --   --   ALBUMIN 3.2*  --   --  3.1*  --   --   AST 102*  --   --  99*  --   --   ALT 96*  --   --  157*  --   --   ALKPHOS 80  --   --  71  --   --   BILITOT 0.5  --   --  0.6  --   --   GFRNONAA 54*  --   --  >60  --   --   ANIONGAP 19*  --   --  9  --   --    < > = values in this interval not displayed.    Hematology Recent Labs  Lab 03/28/21 0653 03/28/21 0655 03/28/21 2315 03/29/21 0523 03/29/21 1015  WBC 26.2*  --  9.9  --   --   RBC 4.33  --  4.08  --   --   HGB 13.9   < > 13.4 11.6* 12.9  HCT 45.7   < > 40.6 34.0* 38.0  MCV 105.5*  --  99.5  --   --   MCH 32.1  --  32.8  --   --   MCHC 30.4  --  33.0  --   --   RDW 13.8  --  14.1  --   --   PLT 250  --  188  --   --    < > = values in this interval not displayed.   BNP Recent Labs  Lab 03/28/21 0653  BNP  43.4    DDimer No results for input(s): DDIMER in the last 168 hours.   Radiology  CT HEAD WO CONTRAST  Result Date: 03/28/2021 CLINICAL DATA:  Mental status change EXAM: CT HEAD WITHOUT CONTRAST TECHNIQUE: Contiguous axial images were obtained from the base of the skull through the vertex without intravenous contrast. COMPARISON:  09/20/2018 FINDINGS: Brain: There is no acute intracranial hemorrhage, mass effect, or edema. Gray-white differentiation is preserved. There is no extra-axial fluid collection. Ventricles  and sulci are within normal limits in size and configuration. Vascular: No hyperdense vessel or unexpected calcification. Skull: Calvarium is unremarkable. Sinuses/Orbits: Likely odontogenic mild right maxillary sinus mucosal thickening with posterior right maxillary molar periapical lucencies. Patchy ethmoid mucosal thickening. Nasal cavity opacification. No acute orbital abnormality. Other: Question periorbital soft tissue swelling. Mastoid air cells are clear. IMPRESSION: No acute intracranial abnormality. Question bilateral periorbital soft tissue swelling. Electronically Signed   By: Macy Mis M.D.   On: 03/28/2021 09:07   CT CERVICAL SPINE WO CONTRAST  Result Date: 03/28/2021 CLINICAL DATA:  Unresponsive EXAM: CT CERVICAL SPINE WITHOUT CONTRAST TECHNIQUE: Multidetector CT imaging of the cervical spine was performed without intravenous contrast. Multiplanar CT image reconstructions were also generated. COMPARISON:  None. FINDINGS: Alignment: Preserved. Skull base and vertebrae: No acute cervical spine fracture. Vertebral body heights are maintained. Bulky anterior osteophytes. Soft tissues and spinal canal: No prevertebral fluid or swelling. No visible canal hematoma. Disc levels:  Mild degenerative changes are present. Upper chest: No acute abnormality. Other: Trace calcified plaque at the left common carotid bifurcation. IMPRESSION: No acute cervical spine fracture. Electronically Signed   By: Macy Mis M.D.   On: 03/28/2021 09:11   DG CHEST PORT 1 VIEW  Result Date: 03/29/2021 CLINICAL DATA:  50 year old female central line placement. EXAM: PORTABLE CHEST 1 VIEW COMPARISON:  CT Chest, Abdomen, and Pelvis today 0113 hours. Portable chest 03/28/2021. FINDINGS: Portable AP semi upright view at 0416 hours. Left subclavian approach central line in place, tip is below the carina at the level of the lower SVC. Otherwise stable lines and tubes. Note that the enteric tube side hole is at the level  of the gastroesophageal junction. No pneumothorax. Lower lung volumes since yesterday. Stable cardiac size and mediastinal contours. Confluent lower lobe opacity better demonstrated by CT this morning. Paucity of bowel gas in the upper abdomen. IMPRESSION: 1. Left subclavian central line placed, tip at the lower SVC level. No pneumothorax. 2. Enteric tube side hole at the GEJ level. Advanced 6 cm to ensure side hole placement within the stomach. Electronically Signed   By: Genevie Ann M.D.   On: 03/29/2021 04:32   DG Chest Port 1 View  Result Date: 03/28/2021 CLINICAL DATA:  50 year old female found unresponsive. Status post endotracheal tube placement. EXAM: PORTABLE CHEST 1 VIEW COMPARISON:  Chest x-ray 03/12/2021. FINDINGS: An endotracheal tube is in place with tip 4.3 cm above the carina. A nasogastric tube is seen extending into the stomach, however, the tip of the nasogastric tube extends below the lower margin of the image. Lung volumes are low. No consolidative airspace disease. No pleural effusions. No pneumothorax. No pulmonary nodule or mass noted. Pulmonary vasculature and the cardiomediastinal silhouette are within normal limits. IMPRESSION: 1. Support apparatus, as above. 2. Low lung volumes without radiographic evidence of acute cardiopulmonary disease. Electronically Signed   By: Vinnie Langton M.D.   On: 03/28/2021 07:27   EEG adult  Result Date: 03/28/2021 Lora Havens, MD  03/28/2021  2:08 PM Patient Name: Lori Bean MRN: [id number] Epilepsy Attending: Lora Havens Referring Physician/Provider: Dr Lesleigh Noe Date: 03/28/2021 Duration: 23.03 mins Patient history: a 50 year old female who presents with unresponsiveness. EEG to evaluate for seizure Level of alertness: lethargic AEDs during EEG study:  None Technical aspects: This EEG study was done with scalp electrodes positioned according to the 10-20 International system of electrode placement. Electrical activity was  acquired at a sampling rate of _0  and reviewed with a high frequency filter of _1  and a low frequency filter of _2 . EEG data were recorded continuously and digitally stored. Description: No clear posterior dominant rhythm was seen. EEG showed continuous generalized 5-_3  theta-alpha as well as intermittent generalized 2-_4  delta slowing. Hyperventilation and photic stimulation were not performed.   ABNORMALITY - Continuous slow, generalized IMPRESSION: This study is suggestive of moderate diffuse encephalopathy, nonspecific etiology but likely related to sedation. No seizures or epileptiform discharges were seen throughout the recording. Priyanka O Yadav   [place] EKG SITE RITE  Result Date: 03/28/2021 If Site Rite image not attached, placement could not be confirmed due to current cardiac rhythm.  CT CHEST ABDOMEN PELVIS WO CONTRAST  Result Date: 03/29/2021 CLINICAL DATA:  Sepsis EXAM: CT CHEST, ABDOMEN AND PELVIS WITHOUT CONTRAST TECHNIQUE: Multidetector CT imaging of the chest, abdomen and pelvis was performed following the standard protocol without IV contrast. COMPARISON:  Chest radiograph dated 03/28/2021. CT abdomen/pelvis dated 04/26/2020. CTA chest dated 08/28/2019. FINDINGS: CT CHEST FINDINGS Cardiovascular: The heart is normal in size. No pericardial effusion. No evidence of thoracic aortic aneurysm. Mediastinum/Nodes: No suspicious mediastinal lymphadenopathy. Visualized thyroid is unremarkable. Lungs/Pleura: Endotracheal tube terminates 2.2 cm above the carina. Patchy dependent opacities in the bilateral lower lobes, favoring atelectasis. Additional mild dependent atelectasis in the bilateral upper lobes. Evaluation of the lung parenchyma is constrained by respiratory motion. Within that constraint, there are no suspicious pulmonary nodules. No focal consolidation. No pleural effusion or pneumothorax. Musculoskeletal: Degenerative changes of the thoracic spine. CT ABDOMEN PELVIS FINDINGS  Hepatobiliary: Unenhanced liver is unremarkable. Status post cholecystectomy. No intrahepatic or extrahepatic ductal dilatation. Pancreas: Within normal limits. Spleen: Within normal limits. Adrenals/Urinary Tract: Adrenal glands are within normal limits. Excretory contrast in the bilateral renal collecting systems related to recent CTA. Kidneys otherwise within normal limits. No hydronephrosis. Excretory contrast in the bladder. Stomach/Bowel: Enteric tube terminates in the gastric cardia, just distal to the GE junction. No evidence of bowel obstruction. Normal appendix (series 3/image 92). No colonic wall thickening or inflammatory changes. Vascular/Lymphatic: No evidence of abdominal aortic aneurysm. No suspicious abdominopelvic lymphadenopathy. Reproductive: Uterus is within normal limits. Bilateral ovaries are within normal limits. Other: No abdominopelvic ascites. Musculoskeletal: Status post PLIF at L5-S1. Degenerative changes of the lumbar spine. IMPRESSION: Endotracheal tube terminates 2.2 cm above the carina. Enteric tube terminates in the gastric cardia, just below the GE junction. Patchy/dependent opacities in the posterior upper and lower lobes, favoring atelectasis. No acute findings in the abdomen/pelvis. Electronically Signed   By: Julian Hy M.D.   On: 03/29/2021 02:19   CT ANGIO HEAD NECK W WO CM (CODE STROKE)  Result Date: 03/28/2021 CLINICAL DATA:  Unresponsive EXAM: CT ANGIOGRAPHY HEAD AND NECK TECHNIQUE: Multidetector CT imaging of the head and neck was performed using the standard protocol during bolus administration of intravenous contrast. Multiplanar CT image reconstructions and MIPs were obtained to evaluate the vascular anatomy. Carotid stenosis measurements (when applicable) are obtained utilizing NASCET criteria, using the distal internal carotid  diameter as the denominator. CONTRAST:  11m OMNIPAQUE IOHEXOL 350 MG/ML SOLN COMPARISON:  None. FINDINGS: CTA NECK Aortic arch:  Great vessel origins are patent. Right carotid system: Patent. Common carotid is partially obscured by adjacent venous artifact. No stenosis. Left carotid system: Patent. Minor plaque along the distal common carotid. No stenosis. Vertebral arteries: Patent and codominant. Skeleton: Cervical spine dictated previously. Other neck: Enteric and endotracheal tubes are present. Upper chest: No apical lung mass. Review of the MIP images confirms the above findings CTA HEAD Anterior circulation: Intracranial internal carotid arteries are patent. Anterior cerebral arteries are patent. Left A1 ACA is dominant. Anterior communicating artery is present. Middle cerebral arteries are patent. Posterior circulation: Intracranial vertebral arteries are patent. Basilar artery is patent. Major cerebellar artery origins are patent. Bilateral posterior communicating arteries are present. There is fetal origin of the right PCA. Posterior cerebral arteries are patent. Venous sinuses: Patent as allowed by contrast bolus timing. Review of the MIP images confirms the above findings IMPRESSION: No large vessel occlusion, hemodynamically significant stenosis, or evidence of dissection. Electronically Signed   By: PMacy MisM.D.   On: 03/28/2021 10:14    Cardiac Studies  Echo pending  Patient Profile  PGREENLY RARICKis a 50y.o. female with morbid obesity (BMI 70), tobacco abuse, asthma, bipolar disorder, diabetes who was admitted on 03/28/2021 with acute hypercarbic respiratory failure and altered mental status requiring intubation.  Cardiology was consulted on 03/28/2021 for elevated troponin.  Assessment & Plan   Elevated troponin/demand ischemia versus non-STEMI -Admitted with hypercarbic respiratory failure.  Initial pH was 7.1.  Severely elevated lactic acid greater than 11. -She also was hypercarbic on blood gas.  Seems to be a mixed picture. -Now in ICU on mechanical ventilation. -Cardiology was consulted for  troponin value 1735 and is trending down. -EKG is nonischemic. -She is hemodynamically stable. -CT chest without any evidence of coronary calcium. -Overall suspect this is secondary troponin elevation.  She can not tell me if she has chest pain however she has a nonischemic EKG and minimal troponin elevation for her level of critical illness. -For now continue aspirin, Lipitor and heparin drip.  We will obtain an echocardiogram.  She likely will need an ischemia evaluation at some point but this could be considered at a later date.  Currently her multiorgan system failure and hypercarbic respiratory failure take precedence.  CRITICAL CARE Performed by: WLake BellsT O'Neal  Total critical care time: 40 minutes. Critical care time was exclusive of separately billable procedures and treating other patients. Critical care was necessary to treat or prevent imminent or life-threatening deterioration. Critical care was time spent personally by me on the following activities: development of treatment plan with patient and/or surrogate as well as nursing, discussions with consultants, evaluation of patient's response to treatment, examination of patient, obtaining history from patient or surrogate, ordering and performing treatments and interventions, ordering and review of laboratory studies, ordering and review of radiographic studies, pulse oximetry and re-evaluation of patient's condition.  For questions or updates, please contact CClaytonPlease consult [email or web address] for contact info under      Signed, WLake BellsT. OAudie Box MD, FNew [place] 03/29/2021 10:57 AM

## 2021-03-29 NOTE — Progress Notes (Signed)
Neurology Progress Note  Patient ID: Lori Bean is a 50 y.o. with PMHx of  has a past medical history of Abdominal hernia, Anginal pain ([place]), Anxiety, Arthritis, Asthma, Bipolar affective disorder ([place]), Bronchitis, CHF (congestive heart failure) ([place]), Depression, Diabetes mellitus without complication ([place]), GERD (gastroesophageal reflux disease), Heart murmur, Hypertension, Lymphedema (06/07/2018), Morbid (severe) obesity due to excess calories ([employer]), Neuropathy, Overactive bladder, Schizophrenia ([place]), Sciatica, Shortness of breath, Sleep apnea, and Snoring disorder.  Initially consulted for: Altered mental status  Major interval events:  -Difficult access overnight, required line placement to obtain repeat labs, down to have a significant troponin leak for which she was started on heparin drip  Subjective: -Eager to have ET tube removed  Exam: Vitals:   03/29/21 0823 03/29/21 0825  BP:    Pulse:  93  Resp:  (!) 30  Temp:    SpO2: 94% 94%   Gen: In bed, gagging slightly on the ET tube when sedation is paused Resp: non-labored breathing, no grossly audible wheezing Cardiac: Perfusing extremities well  Abd: soft, nt  Neuro: MS: Awake, alert, following commands, CN: Pupils equal round reactive to light, EOMI, face symmetric Sensory/motor: Moving all extremities grossly equally antigravity to light touch  Pertinent Labs:   Basic Metabolic Panel: Recent Labs  Lab 03/28/21 0653 03/28/21 0655 03/28/21 0829 03/28/21 1154 03/28/21 2315 03/29/21 0523 03/29/21 1015  NA 136 135 137 137 140 140 141  K 5.1 5.0 3.9 3.6 3.5 3.3* 3.3*  CL 101 105  --   --  105  --   --   CO2 16*  --   --   --  26  --   --   GLUCOSE 341* 335*  --   --  131*  --   --   BUN 13 15  --   --  13  --   --   CREATININE 1.21* 1.10*  --   --  1.00  --   --   CALCIUM 9.1  --   --   --  9.0  --   --   MG  --   --   --   --  2.1  --   --     CBC: Recent Labs  Lab 03/28/21 0653 03/28/21 0655  03/28/21 0829 03/28/21 1154 03/28/21 2315 03/29/21 0523 03/29/21 1015  WBC 26.2*  --   --   --  9.9  --   --   NEUTROABS 10.5*  --   --   --   --   --   --   HGB 13.9   < > 14.3 12.9 13.4 11.6* 12.9  HCT 45.7   < > 42.0 38.0 40.6 34.0* 38.0  MCV 105.5*  --   --   --  99.5  --   --   PLT 250  --   --   --  188  --   --    < > = values in this interval not displayed.    Coagulation Studies: Recent Labs    03/28/21 2315  LABPROT 12.7  INR 1.0   Lab Results  Component Value Date   VITAMINB12 385 03/28/2021    Lab Results  Component Value Date   TSH 0.634 03/28/2021     Repeat ammonia normal at 30, though there was an increase in AST/ALT (AST 102 -> 299, ALT 96 -> 157)  Troponin peak at 1735,  MRI brain personally reviewed, no evidence of acute  intracranial process or seizure focus  ECHO  1. Left ventricular ejection fraction, by estimation, is 60 to 65%. The  left ventricle has normal function. The left ventricle has no regional  wall motion abnormalities. Left ventricular diastolic parameters were  normal.   2. Right ventricular systolic function is normal. The right ventricular  size is normal.   3. The mitral valve is normal in structure. No evidence of mitral valve  regurgitation. No evidence of mitral stenosis.   4. The aortic valve is normal in structure. Aortic valve regurgitation is  not visualized. No aortic stenosis is present.   5. The inferior vena cava is normal in size with greater than 50%  respiratory variability, suggesting right atrial pressure of 3 mmHg.   Impression: This is a 50 year old woman who presented with being found down for unclear reasons.  Neurological work-up to date is reassuring against stroke or seizure and her mental status is rapidly improving.  Possibly presentation was secondary to her longstanding poorly controlled asthma, or some toxic/metabolic insult given the initial ammonia and still rising liver function tests.  Appreciate  cardiology involvement as well.  At this time low concern for a primary neurological process and therefore neurology will sign off at this time.  Recommendations: -PCP or primary team to follow-up thiamine level, pending -Given low normal B12 at 385, recommend supplementation with 500 mcg p.o. daily for goal level greater than 400 -THC cessation counseling as this is associated with stroke risk as well as risk of PRES/RCVS -Please reach out if new neurological concerns arise, neurology will be available on an as-needed basis going forward  Lesleigh Noe MD-PhD [facility] [phone]   CRITICAL CARE Performed by: Lorenza Chick   Total critical care time: 35 minutes  Critical care time was exclusive of separately billable procedures and treating other patients.  Critical care was necessary to treat or prevent imminent or life-threatening deterioration.  Critical care was time spent personally by me on the following activities: development of treatment plan with patient and/or surrogate as well as nursing, discussions with consultants, evaluation of patient's response to treatment, examination of patient, obtaining history from patient or surrogate, ordering and performing treatments and interventions, ordering and review of laboratory studies, ordering and review of radiographic studies, pulse oximetry and re-evaluation of patient's condition.

## 2021-03-29 NOTE — Progress Notes (Signed)
Pt transported on vent to MRI and back to 1Q24 without complications. Vitals stable,RN at bedside, RT will continue to monitor.

## 2021-03-29 NOTE — Procedures (Signed)
Arterial Catheter Insertion Procedure Note  MALAYSHIA ALL  [id number]  August 18, 1971  Date:03/29/21  Time:12:46 AM    Provider Performing: Claudell Kyle C    Procedure: Insertion of Arterial Line [phone]) without US guidance  Indication(s) Blood pressure monitoring and/or need for frequent ABGs  Consent Unable to obtain consent due to emergent nature of procedure.  Anesthesia None   Time Out Verified patient identification, verified procedure, site/side was marked, verified correct patient position, special equipment/implants available, medications/allergies/relevant history reviewed, required imaging and test results available.   Sterile Technique Maximal sterile technique including full sterile barrier drape, hand hygiene, sterile gown, sterile gloves, mask, hair covering, sterile ultrasound probe cover (if used).   Procedure Description Area of catheter insertion was cleaned with chlorhexidine and draped in sterile fashion. With real-time ultrasound guidance an arterial catheter was placed into the left radial artery.  Appropriate arterial tracings confirmed on monitor.     Complications/Tolerance None; patient tolerated the procedure well.   EBL Minimal   Specimen(s) None

## 2021-03-29 NOTE — Progress Notes (Signed)
eLink Physician-Brief Progress Note Patient Name: Lori Bean DOB: August 28, 1971 MRN: [id number]   Date of Service  03/29/2021  HPI/Events of Note  ABG reviewed.  eICU Interventions  Will order a follow up ABG for 10 AM.        Kerry Kass Arhan Mcmanamon 03/29/2021, 6:30 AM

## 2021-03-29 NOTE — Procedures (Signed)
Central Venous Catheter Insertion Procedure Note  Lori Bean  [id number]  30-May-1971  Date:03/29/21  Time:4:00 AM   Provider Performing:Seong-Joo Carson Myrtle   Procedure: Insertion of Non-tunneled Central Venous Catheter(36556) without US guidance  Indication(s) Difficult access  Consent Unable to obtain consent due to emergent nature of procedure.  Anesthesia Topical only with 1% lidocaine   Timeout Verified patient identification, verified procedure, site/side was marked, verified correct patient position, special equipment/implants available, medications/allergies/relevant history reviewed, required imaging and test results available.  Sterile Technique Maximal sterile technique including full sterile barrier drape, hand hygiene, sterile gown, sterile gloves, mask, hair covering, sterile ultrasound probe cover (if used).  Procedure Description Area of catheter insertion was cleaned with chlorhexidine and draped in sterile fashion.  Without real-time ultrasound guidance a central venous catheter was placed into the left subclavian vein. Nonpulsatile blood flow and easy flushing noted in all ports.  The catheter was sutured in place and sterile dressing applied.  Complications/Tolerance None; patient tolerated the procedure well. Chest X-ray is ordered to verify placement.    EBL Minimal  Specimen(s) None  Renee Pain, MD Board Certified by the ABIM, [place]

## 2021-03-29 NOTE — Progress Notes (Signed)
eLink Physician-Brief Progress Note Patient Name: Lori Bean DOB: 19-Nov-1970 MRN: [id number]   Date of Service  03/29/2021  HPI/Events of Note  Patient with frequent loose stools likely secondary to Lactulose, ammonia level currently 30. Unfortunately patient just received a dose of the lactulose.  eICU Interventions  Lactulose discontinued, Flexiseal ordered, bedside RN instructed to discontinue Flexiseal as soon as diarrhea abates.        Lori Bean 03/29/2021, 10:36 PM

## 2021-03-29 NOTE — Progress Notes (Signed)
Pharmacy Electrolyte Replacement  Recent Labs:  Recent Labs    03/28/21 2315 03/29/21 0523  K 3.5 3.3*  MG 2.1  --   CREATININE 1.00  --     Low Critical Values (K </= 2.5, Phos </= 1, Mg </= 1) Present: None  Plan:  Kcl Iv 10 mEq x 4 Kcl per tube 20 mEq x 2  Barth Kirks, PharmD, BCCCP Clinical Pharmacist [phone]  Please check AMION for all [place] numbers  03/29/2021 8:22 AM

## 2021-03-29 NOTE — Progress Notes (Signed)
Echocardiogram 2D Echocardiogram has been performed.  Lori Bean 03/29/2021, 1:30 PM

## 2021-03-29 NOTE — Progress Notes (Signed)
eLink Physician-Brief Progress Note Patient Name: Lori Bean DOB: 1971/03/03 MRN: [id number]   Date of Service  03/29/2021  HPI/Events of Note  Troponin went from baseline of 44  to  > 1,700, Triglycerides 533.  eICU Interventions  Troponin trended, 12 lead EKG ordered, Heparin gtt ordered, ASA ordered, Echo  ordered, cardiology consulted for r/o NSTEMI. Propofol discontinued and Precedex substituted.        [name] U [name] 03/29/2021, 1:10 AM

## 2021-03-29 NOTE — Consult Note (Signed)
Cardiology Consultation:   Patient ID: Lori Bean MRN: [id number]; DOB: 02/21/1971  Admit date: 03/28/2021 Date of Consult: 03/29/2021  PCP:  Lori Sailors, PA   [facility] Providers Cardiologist:  Lori Latch, MD        Patient Profile:   Lori Bean is a 50 y.o. female with a hx of OSA, asthma (with multiple exacerbation admissions in the last 12 months) who presented initially with altered mental status (had to be intubated) who is being seen 03/29/2021 for the evaluation of NSTEMI at the request of Dr Lori Bean.   History of Present Illness:   [name] is a 50 y.o. female with a hx of OSA, asthma (with multiple exacerbation admissions in the last 12 months) who presented initially with altered mental status (had to be intubated) who is being seen 03/29/2021 for the evaluation of NSTEMI at the request of Dr Lori Bean. She was admitted this morning in the setting of severe lactic acidosis and hypercarbic respiratory failure. Her intial troponin was  44 which rose to 1735; EKG showed T wave changes hence Cardiology was consulted for NSTEMI. Patient is intubated so cannot get history of chest pain. She has never had LHC; her last echo from 2019 was largely unremarkable.     Past Medical History:  Diagnosis Date   Abdominal hernia    Anginal pain ([employer])    occ from asthma   Anxiety    Arthritis    Asthma    Bipolar affective disorder ([place])    Schizophrenia   Bronchitis    hx of   CHF (congestive heart failure) ([place])    Depression    Diabetes mellitus without complication ([place])    " New Onset " per patient   GERD (gastroesophageal reflux disease)    Heart murmur    Hypertension    takes meds   Lymphedema 06/07/2018   Morbid (severe) obesity due to excess calories ([facility])    Neuropathy    left leg , "from back surgery"   Overactive bladder    Schizophrenia ([place])    Sciatica    Shortness of breath    Sleep apnea    Snoring disorder    Pt  stated my boyfriend always wakes me up and tells me to breathe    Past Surgical History:  Procedure Laterality Date   BACK SURGERY     3 back surgeries   CHOLECYSTECTOMY     COLONOSCOPY WITH PROPOFOL N/A 01/23/2016   Procedure: COLONOSCOPY WITH PROPOFOL;  Surgeon: Lori Horner, MD;  Location: [facility];  Service: Endoscopy;  Laterality: N/A;   EYE SURGERY     Metal plate in right eye. Had fracture in right eye   gallstones reomved     KNEE ARTHROSCOPY WITH MENISCAL REPAIR Right 09/28/2016   Procedure: KNEE ARTHROSCOPY WITH MENISCAL REPAIR;  Surgeon: Lori Leitz, MD;  Location: [facility];  Service: Orthopedics;  Laterality: Right;  Right partial meniscectomy and chondroplasty, patellar/femoral joint and medial femoral condyle    LUMBAR LAMINECTOMY/DECOMPRESSION MICRODISCECTOMY  04/01/2012   Procedure: LUMBAR LAMINECTOMY/DECOMPRESSION MICRODISCECTOMY 2 LEVELS;  Surgeon: Lori Ghee, MD;  Location: [facility];  Service: Neurosurgery;  Laterality: Left;  Lumbar four-five, lumbar five sacral one microdiscectomy    LUMBAR WOUND DEBRIDEMENT  04/29/2012   Procedure: LUMBAR WOUND DEBRIDEMENT;  Surgeon: Lori Ghee, MD;  Location: [facility];  Service: Neurosurgery;  Laterality: N/A;  lumbar wound debridement   ROTATOR CUFF REPAIR  Right shoulder   TUBAL LIGATION         Inpatient Medications: Scheduled Meds:  aspirin  325 mg Per Tube Daily   budesonide (PULMICORT) nebulizer solution  0.25 mg Nebulization BID   chlorhexidine gluconate (MEDLINE KIT)  15 mL Mouth Rinse BID   docusate  100 mg Per Tube BID   heparin  4,000 Units Intravenous Once   insulin aspart  0-15 Units Subcutaneous Q4H   ipratropium-albuterol  3 mL Nebulization Q6H   lactulose  10 g Per Tube BID   mouth rinse  15 mL Mouth Rinse 10 times per day   montelukast  10 mg Per Tube QHS   nicotine  7 mg Transdermal Daily   pantoprazole sodium  40 mg Per Tube Daily   polyethylene glycol  17 g Per Tube Daily    Continuous Infusions:  cefTRIAXone (ROCEPHIN)  IV     dexmedetomidine (PRECEDEX) IV infusion     fentaNYL infusion INTRAVENOUS 175 mcg/hr (03/28/21 2130)   heparin     vancomycin 1,000 mg (03/28/21 2113)   PRN Meds: acetaminophen, docusate, fentaNYL, hydrALAZINE, midazolam, polyethylene glycol  Allergies:    Allergies  Allergen Reactions   Aspirin Nausea And Vomiting    Social History:   Social History   Socioeconomic History   Marital status: Divorced    Spouse name: Not on file   Number of children: 2   Years of education: 12   Highest education level: Not on file  Occupational History    Comment: disabled  Tobacco Use   Smoking status: Former    Packs/day: 1.00    Years: 24.00    Pack years: 24.00    Types: Cigarettes    Quit date: 12/16/2020    Years since quitting: 0.2   Smokeless tobacco: Never   Tobacco comments:     last cigarette on 12/16/2020  Vaping Use   Vaping Use: Every day   Substances: Nicotine  Substance and Sexual Activity   Alcohol use: No    Alcohol/week: 0.0 standard drinks    Comment: quit Nov. 2014   Drug use: Not Currently    Types: Cocaine   Sexual activity: Yes    Birth control/protection: Surgical  Other Topics Concern   Not on file  Social History Narrative   Lives alone   Caffeine - coffee 1 cup daily   Social Determinants of Health   Financial Resource Strain: Not on file  Food Insecurity: Not on file  Transportation Needs: Not on file  Physical Activity: Not on file  Stress: Not on file  Social Connections: Not on file  Intimate Partner Violence: Not on file    Family History:   Family History  Problem Relation Age of Onset   Diabetes Mother    Hypertension Mother    Diabetes Father    Heart disease Paternal Aunt    Cancer Paternal Aunt    Esophageal cancer Neg Hx    Stomach cancer Neg Hx      ROS:  Please see the history of present illness.  All other ROS reviewed and negative.     Physical Exam/Data:    Vitals:   03/28/21 2041 03/28/21 2045 03/28/21 2337 03/29/21 0045  BP: 111/73 114/74 114/72   Pulse: 80 79 85   Resp: (!) 21 (!) 22 (!) 35   Temp:    98.9 F (37.2 C)  TempSrc:    Oral  SpO2: 99% 99% 99%   Weight:  Height:        Intake/Output Summary (Last 24 hours) at 03/29/2021 0248 Last data filed at 03/28/2021 2000 Gross per 24 hour  Intake 766.98 ml  Output 750 ml  Net 16.98 ml   Last 3 Weights 03/28/2021 03/28/2021 03/14/2021  Weight (lbs) 382 lb 15 oz 260 lb 260 lb 1.6 oz  Weight (kg) 173.7 kg 117.935 kg 117.981 kg     Body mass index is 70.04 kg/m.  General:  Ill appearing HEENT: ET in place Lymph: no adenopathy Neck: no JVD Vascular: No carotid bruits; FA pulses 2+ bilaterally without bruits  Cardiac:  RRR, no JVD Lungs:  Mechanical BS Abd: soft, nontender, no hepatomegaly  Musculoskeletal:  No deformities, Neuro:  Sedated Psych:  Intubated  EKG:  The EKG was personally reviewed and demonstrates some ST/T waves changes. Does not meet criteria for STEMI   Laboratory Data:  High Sensitivity Troponin:   Recent Labs  Lab 03/12/21 1822 03/28/21 0653 03/28/21 2315  TROPONINIHS 4 44* 1,735*     Chemistry Recent Labs  Lab 03/28/21 0653 03/28/21 0655 03/28/21 0829 03/28/21 1154 03/28/21 2315  NA 136 135 137 137 140  K 5.1 5.0 3.9 3.6 3.5  CL 101 105  --   --  105  CO2 16*  --   --   --  26  GLUCOSE 341* 335*  --   --  131*  BUN 13 15  --   --  13  CREATININE 1.21* 1.10*  --   --  1.00  CALCIUM 9.1  --   --   --  9.0  GFRNONAA 54*  --   --   --  >60  ANIONGAP 19*  --   --   --  9    Recent Labs  Lab 03/28/21 0653 03/28/21 2315  PROT 6.3* 6.4*  ALBUMIN 3.2* 3.1*  AST 102* 99*  ALT 96* 157*  ALKPHOS 80 71  BILITOT 0.5 0.6   Hematology Recent Labs  Lab 03/28/21 0653 03/28/21 0655 03/28/21 0829 03/28/21 1154 03/28/21 2315  WBC 26.2*  --   --   --  9.9  RBC 4.33  --   --   --  4.08  HGB 13.9   < > 14.3 12.9 13.4  HCT 45.7   < >  42.0 38.0 40.6  MCV 105.5*  --   --   --  99.5  MCH 32.1  --   --   --  32.8  MCHC 30.4  --   --   --  33.0  RDW 13.8  --   --   --  14.1  PLT 250  --   --   --  188   < > = values in this interval not displayed.   BNP Recent Labs  Lab 03/28/21 0653  BNP 43.4    DDimer No results for input(s): DDIMER in the last 168 hours.   Radiology/Studies:  CT HEAD WO CONTRAST  Result Date: 03/28/2021 CLINICAL DATA:  Mental status change EXAM: CT HEAD WITHOUT CONTRAST TECHNIQUE: Contiguous axial images were obtained from the base of the skull through the vertex without intravenous contrast. COMPARISON:  09/20/2018 FINDINGS: Brain: There is no acute intracranial hemorrhage, mass effect, or edema. Gray-white differentiation is preserved. There is no extra-axial fluid collection. Ventricles and sulci are within normal limits in size and configuration. Vascular: No hyperdense vessel or unexpected calcification. Skull: Calvarium is unremarkable. Sinuses/Orbits: Likely odontogenic mild right maxillary sinus mucosal  thickening with posterior right maxillary molar periapical lucencies. Patchy ethmoid mucosal thickening. Nasal cavity opacification. No acute orbital abnormality. Other: Question periorbital soft tissue swelling. Mastoid air cells are clear. IMPRESSION: No acute intracranial abnormality. Question bilateral periorbital soft tissue swelling. Electronically Signed   By: Macy Mis M.D.   On: 03/28/2021 09:07   CT CERVICAL SPINE WO CONTRAST  Result Date: 03/28/2021 CLINICAL DATA:  Unresponsive EXAM: CT CERVICAL SPINE WITHOUT CONTRAST TECHNIQUE: Multidetector CT imaging of the cervical spine was performed without intravenous contrast. Multiplanar CT image reconstructions were also generated. COMPARISON:  None. FINDINGS: Alignment: Preserved. Skull base and vertebrae: No acute cervical spine fracture. Vertebral body heights are maintained. Bulky anterior osteophytes. Soft tissues and spinal canal: No  prevertebral fluid or swelling. No visible canal hematoma. Disc levels:  Mild degenerative changes are present. Upper chest: No acute abnormality. Other: Trace calcified plaque at the left common carotid bifurcation. IMPRESSION: No acute cervical spine fracture. Electronically Signed   By: Macy Mis M.D.   On: 03/28/2021 09:11   DG Chest Port 1 View  Result Date: 03/28/2021 CLINICAL DATA:  50 year old female found unresponsive. Status post endotracheal tube placement. EXAM: PORTABLE CHEST 1 VIEW COMPARISON:  Chest x-ray 03/12/2021. FINDINGS: An endotracheal tube is in place with tip 4.3 cm above the carina. A nasogastric tube is seen extending into the stomach, however, the tip of the nasogastric tube extends below the lower margin of the image. Lung volumes are low. No consolidative airspace disease. No pleural effusions. No pneumothorax. No pulmonary nodule or mass noted. Pulmonary vasculature and the cardiomediastinal silhouette are within normal limits. IMPRESSION: 1. Support apparatus, as above. 2. Low lung volumes without radiographic evidence of acute cardiopulmonary disease. Electronically Signed   By: Vinnie Langton M.D.   On: 03/28/2021 07:27   EEG adult  Result Date: 03/28/2021 Lora Havens, MD     03/28/2021  2:08 PM Patient Name: AINARA ELDRIDGE MRN: [id number] Epilepsy Attending: Lora Havens Referring Physician/Provider: Dr Lesleigh Noe Date: 03/28/2021 Duration: 23.03 mins Patient history: a 50 year old female who presents with unresponsiveness. EEG to evaluate for seizure Level of alertness: lethargic AEDs during EEG study:  None Technical aspects: This EEG study was done with scalp electrodes positioned according to the 10-20 International system of electrode placement. Electrical activity was acquired at a sampling rate of 500Hz  and reviewed with a high frequency filter of 70Hz  and a low frequency filter of 1Hz . EEG data were recorded continuously and digitally stored.  Description: No clear posterior dominant rhythm was seen. EEG showed continuous generalized 5-9hz  theta-alpha as well as intermittent generalized 2-3hz  delta slowing. Hyperventilation and photic stimulation were not performed.   ABNORMALITY - Continuous slow, generalized IMPRESSION: This study is suggestive of moderate diffuse encephalopathy, nonspecific etiology but likely related to sedation. No seizures or epileptiform discharges were seen throughout the recording. Priyanka O Yadav   [place] EKG SITE RITE  Result Date: 03/28/2021 If Site Rite image not attached, placement could not be confirmed due to current cardiac rhythm.  CT CHEST ABDOMEN PELVIS WO CONTRAST  Result Date: 03/29/2021 CLINICAL DATA:  Sepsis EXAM: CT CHEST, ABDOMEN AND PELVIS WITHOUT CONTRAST TECHNIQUE: Multidetector CT imaging of the chest, abdomen and pelvis was performed following the standard protocol without IV contrast. COMPARISON:  Chest radiograph dated 03/28/2021. CT abdomen/pelvis dated 04/26/2020. CTA chest dated 08/28/2019. FINDINGS: CT CHEST FINDINGS Cardiovascular: The heart is normal in size. No pericardial effusion. No evidence of thoracic aortic aneurysm. Mediastinum/Nodes: No  suspicious mediastinal lymphadenopathy. Visualized thyroid is unremarkable. Lungs/Pleura: Endotracheal tube terminates 2.2 cm above the carina. Patchy dependent opacities in the bilateral lower lobes, favoring atelectasis. Additional mild dependent atelectasis in the bilateral upper lobes. Evaluation of the lung parenchyma is constrained by respiratory motion. Within that constraint, there are no suspicious pulmonary nodules. No focal consolidation. No pleural effusion or pneumothorax. Musculoskeletal: Degenerative changes of the thoracic spine. CT ABDOMEN PELVIS FINDINGS Hepatobiliary: Unenhanced liver is unremarkable. Status post cholecystectomy. No intrahepatic or extrahepatic ductal dilatation. Pancreas: Within normal limits. Spleen: Within normal  limits. Adrenals/Urinary Tract: Adrenal glands are within normal limits. Excretory contrast in the bilateral renal collecting systems related to recent CTA. Kidneys otherwise within normal limits. No hydronephrosis. Excretory contrast in the bladder. Stomach/Bowel: Enteric tube terminates in the gastric cardia, just distal to the GE junction. No evidence of bowel obstruction. Normal appendix (series 3/image 92). No colonic wall thickening or inflammatory changes. Vascular/Lymphatic: No evidence of abdominal aortic aneurysm. No suspicious abdominopelvic lymphadenopathy. Reproductive: Uterus is within normal limits. Bilateral ovaries are within normal limits. Other: No abdominopelvic ascites. Musculoskeletal: Status post PLIF at L5-S1. Degenerative changes of the lumbar spine. IMPRESSION: Endotracheal tube terminates 2.2 cm above the carina. Enteric tube terminates in the gastric cardia, just below the GE junction. Patchy/dependent opacities in the posterior upper and lower lobes, favoring atelectasis. No acute findings in the abdomen/pelvis. Electronically Signed   By: Julian Hy M.D.   On: 03/29/2021 02:19   CT ANGIO HEAD NECK W WO CM (CODE STROKE)  Result Date: 03/28/2021 CLINICAL DATA:  Unresponsive EXAM: CT ANGIOGRAPHY HEAD AND NECK TECHNIQUE: Multidetector CT imaging of the head and neck was performed using the standard protocol during bolus administration of intravenous contrast. Multiplanar CT image reconstructions and MIPs were obtained to evaluate the vascular anatomy. Carotid stenosis measurements (when applicable) are obtained utilizing NASCET criteria, using the distal internal carotid diameter as the denominator. CONTRAST:  66mL OMNIPAQUE IOHEXOL 350 MG/ML SOLN COMPARISON:  None. FINDINGS: CTA NECK Aortic arch: Great vessel origins are patent. Right carotid system: Patent. Common carotid is partially obscured by adjacent venous artifact. No stenosis. Left carotid system: Patent. Minor plaque  along the distal common carotid. No stenosis. Vertebral arteries: Patent and codominant. Skeleton: Cervical spine dictated previously. Other neck: Enteric and endotracheal tubes are present. Upper chest: No apical lung mass. Review of the MIP images confirms the above findings CTA HEAD Anterior circulation: Intracranial internal carotid arteries are patent. Anterior cerebral arteries are patent. Left A1 ACA is dominant. Anterior communicating artery is present. Middle cerebral arteries are patent. Posterior circulation: Intracranial vertebral arteries are patent. Basilar artery is patent. Major cerebellar artery origins are patent. Bilateral posterior communicating arteries are present. There is fetal origin of the right PCA. Posterior cerebral arteries are patent. Venous sinuses: Patent as allowed by contrast bolus timing. Review of the MIP images confirms the above findings IMPRESSION: No large vessel occlusion, hemodynamically significant stenosis, or evidence of dissection. Electronically Signed   By: Macy Mis M.D.   On: 03/28/2021 10:14     Assessment and Plan:   # NSTEMI  -Patient has had a significant troponin elevation concerning for NSTEMI. Unfortunately, cannot assess for symptoms as patient is intubated. EKG does show ST-T waves; but no clear ST-elevation. Will recommend to start heparin -Load Aspirin 325 mg followed by 81 mg daily -Start Ticagrelor 180 loading dose followed by 90 mg BID -Start Atorvastatin 80 mg -Echocardiogram to assess for WMA or takutsubo CM -Hold beta blocker  in the setting of respiratory failure.  -Continue to trend Troponin -Serial EKGs to ensure ST-T evolution -Continue to closely follow for consideration of LHC   Signed, Jaci Lazier, MD  03/29/2021 2:48 AM

## 2021-03-29 NOTE — Progress Notes (Signed)
eLink Physician-Brief Progress Note Patient Name: SHARNITA BOGUCKI DOB: 1971/01/23 MRN: [id number]   Date of Service  03/29/2021  HPI/Events of Note  Left subclavian central line in good position, no pneumothorax. OG tube needs to be advanced 6-8 cm.  eICU Interventions  Communicated above findings to the bedside RN.        Kerry Kass Tomasz Steeves 03/29/2021, 5:33 AM

## 2021-03-29 NOTE — Progress Notes (Signed)
NAME:  Lori Bean, MRN:  [id number], DOB:  [birth year], LOS: 1 ADMISSION DATE:  03/28/2021, CONSULTATION DATE:  7/15 REFERRING MD:  Dr. Sherry Ruffing, CHIEF COMPLAINT:  AMS   History of Present Illness:  Patient is a 50 yo F w/ PMH of asthma with frequent admissions this year for exacerbations, OSA (patient of Dr. [name]), smoker, anxiety, bipolar, schizophrenia, DM2, GERD, HTN, morbid obesity presents to [facility] on 7/15 with AMS.  On 7/15, patient's significant other found patient unresponsive. Was speaking to them 10 minutes prior. EMS called found patient unresponsive, hypotensive, and in respiratory distress. Breathing assisted with BVM. Administered Narcan.   7/15 ED course: patient was hypertensive, agitated, and still being assisted with BVM. Intubated and sedation started. BP improved post intubation. CT head negative. Neuro consulted and ordered MRI. UDS, UA pending. Bcx2 pending. ABG 7.16, 70, 382, 25.7. Increased RR on mech vent to 28. Glucose 341, co2 16, creat 1.21, anion gap 19. Elevated Lfts, Ammonia 189. Lactic acid >11. WBC 26. Afebrile. Troponin 44.  PCCM consulted for admission and medical management.  Pertinent  Medical History   Past Medical History:  Diagnosis Date   Abdominal hernia    Anginal pain ([place])    occ from asthma   Anxiety    Arthritis    Asthma    Bipolar affective disorder ([place])    Schizophrenia   Bronchitis    hx of   CHF (congestive heart failure) ([place])    Depression    Diabetes mellitus without complication ([place])    " New Onset " per patient   GERD (gastroesophageal reflux disease)    Heart murmur    Hypertension    takes meds   Lymphedema 06/07/2018   Morbid (severe) obesity due to excess calories (HCC)    Neuropathy    left leg , "from back surgery"   Overactive bladder    Schizophrenia ([place])    Sciatica    Shortness of breath    Sleep apnea    Snoring disorder    Pt stated my boyfriend always wakes me up and tells me to breathe      Significant Hospital Events: Including procedures, antibiotic start and stop dates in addition to other pertinent events   7/15: admitted to [facility] for acute metabolic encephalopahty; intubated; CT head neg 7/16: more alert. MRI brain neg. Weaning sedation and PS as tolerated  Interim History / Subjective:   More alert. MRI brain neg. Weaning sedation and PS as tolerated  Objective   Blood pressure 107/60, pulse 75, temperature 98.8 F (37.1 C), temperature source Oral, resp. rate 17, height 5\' 2"  (1.575 m), weight (!) 173.7 kg, last menstrual period 10/15/2018, SpO2 94 %.    Vent Mode: CPAP;PSV FiO2 (%):  [30 %-50 %] 30 % Set Rate:  [30 bmp] 30 bmp Vt Set:  [400 mL] 400 mL PEEP:  [5 cmH20] 5 cmH20 Pressure Support:  [8 cmH20] 8 cmH20 Plateau Pressure:  [19 cmH20-23 cmH20] 23 cmH20   Intake/Output Summary (Last 24 hours) at 03/29/2021 2031 Last data filed at 03/29/2021 1900 Gross per 24 hour  Intake 2508.71 ml  Output 550 ml  Net 1958.71 ml   Filed Weights   03/28/21 0700 03/28/21 7564  Weight: 117.9 kg (!) 173.7 kg   Physical Exam: General: Critically ill-appearing, no acute distress HENT: [place], AT, ETT in place Eyes: EOMI, no scleral icterus Respiratory: Clear to auscultation bilaterally.  No crackles, wheezing or rales Cardiovascular: RRR, -M/R/G, no JVD  GI: BS+, soft, nontender Extremities:-Edema,-tenderness Neuro: Opens eyes to voice, intermittently tracks, moves extremities spontaneously x 4   CXR: low lung volumes; ETT in good position. OG tube in stomach.  CT head negative MRI pending  Resolved Hospital Problem list     Assessment & Plan:  Acute metabolic encephalopathy: likely due to hypercarbia; UDS +cannabis. MRI brain normal; CT head negative Hyperammonemia; Elevated LFTs Lactic acidosis - resolved P: -Trend ABG; adjust vent as needed -Continue lactulose; consider removing as mental status improves -trend lactate, ammonia, CMP -Neuro following:  CT head negative; MRI pending -Weaning off sedation after MRI and avoid sedative medications  Acute respiratory failure with hypercapnea S/p intubated on mechanical ventilation Hx of asthma, OSA: on breztri, patient of Dr. [name] Smoker : on chantix P: -Full vent support -PS as tolerated -Daily SBT/WUA. Potential extubation if passes SBT and mental status improves tomorrow -continue sedation for RASS 0 to -1 -VAP prevention in place -Duoneb q6 and pulmicort q12 -singulair -nicotine patch  Hyperglycemia DM2 P: -SSI and CBG monitoring -A1C ordered  Leukocytosis - resolved P: -Trend CBC/fever -Bcx2 sent -Continue Ceftriaxone. Discontinue Vanc tomorrow if cultures remain neg  Elevated troponin P: -Appreciate cardiology input -ASA, heparin, statin  Chronic HTN P: -hold home meds; prn hydralazine ordered -consider restarting tomorrow  GERD P: -PPI  Hx of bipolar, schizo P: -hold abilify, duloxetine, topiramate -consider restarting tomorrow  Best Practice (right click and "Reselect all SmartList Selections" daily)   Diet/type: tubefeeds DVT prophylaxis: prophylactic heparin  GI prophylaxis: PPI Lines: N/A Foley:  N/A Code Status:  full code Last date of multidisciplinary goals of care discussion - daughters at bedside 7/16 Labs   CBC: Recent Labs  Lab 03/28/21 0653 03/28/21 0655 03/28/21 0829 03/28/21 1154 03/28/21 2315 03/29/21 0523 03/29/21 1015  WBC 26.2*  --   --   --  9.9  --   --   NEUTROABS 10.5*  --   --   --   --   --   --   HGB 13.9   < > 14.3 12.9 13.4 11.6* 12.9  HCT 45.7   < > 42.0 38.0 40.6 34.0* 38.0  MCV 105.5*  --   --   --  99.5  --   --   PLT 250  --   --   --  188  --   --    < > = values in this interval not displayed.    Basic Metabolic Panel: Recent Labs  Lab 03/28/21 0653 03/28/21 0655 03/28/21 0829 03/28/21 1154 03/28/21 2315 03/29/21 0523 03/29/21 1015  NA 136 135 137 137 140 140 141  K 5.1 5.0 3.9 3.6 3.5 3.3*  3.3*  CL 101 105  --   --  105  --   --   CO2 16*  --   --   --  26  --   --   GLUCOSE 341* 335*  --   --  131*  --   --   BUN 13 15  --   --  13  --   --   CREATININE 1.21* 1.10*  --   --  1.00  --   --   CALCIUM 9.1  --   --   --  9.0  --   --   MG  --   --   --   --  2.1  --   --    GFR: Estimated Creatinine Clearance: 104.5 mL/min (by C-G  formula based on SCr of 1 mg/dL). Recent Labs  Lab 03/28/21 0653 03/28/21 2315  WBC 26.2* 9.9  LATICACIDVEN >11.0* 1.4    Liver Function Tests: Recent Labs  Lab 03/28/21 0653 03/28/21 2315  AST 102* 99*  ALT 96* 157*  ALKPHOS 80 71  BILITOT 0.5 0.6  PROT 6.3* 6.4*  ALBUMIN 3.2* 3.1*   No results for input(s): LIPASE, AMYLASE in the last 168 hours. Recent Labs  Lab 03/28/21 0653 03/28/21 2315  AMMONIA 189* 30    ABG    Component Value Date/Time   PHART 7.306 (L) 03/29/2021 1015   PCO2ART 46.9 03/29/2021 1015   PO2ART 72 (L) 03/29/2021 1015   HCO3 23.3 03/29/2021 1015   TCO2 25 03/29/2021 1015   ACIDBASEDEF 3.0 (H) 03/29/2021 1015   O2SAT 92.0 03/29/2021 1015     Coagulation Profile: Recent Labs  Lab 03/28/21 2315  INR 1.0    Cardiac Enzymes: No results for input(s): CKTOTAL, CKMB, CKMBINDEX, TROPONINI in the last 168 hours.  HbA1C: Hgb A1c MFr Bld  Date/Time Value Ref Range Status  03/28/2021 11:15 PM 7.2 (H) 4.8 - 5.6 % Final    Comment:    (NOTE) Pre diabetes:          5.7%-6.4%  Diabetes:              >6.4%  Glycemic control for   <7.0% adults with diabetes   12/16/2020 12:50 AM 6.6 (H) 4.8 - 5.6 % Final    Comment:    (NOTE) Pre diabetes:          5.7%-6.4%  Diabetes:              >6.4%  Glycemic control for   <7.0% adults with diabetes     CBG: Recent Labs  Lab 03/29/21 0412 03/29/21 0737 03/29/21 1250 03/29/21 1559 03/29/21 1929  GLUCAP 144* 164* 170* 136* 188*    Critical care time: 50 min     The patient is critically ill with multiple organ systems failure and requires  high complexity decision making for assessment and support, frequent evaluation and titration of therapies, application of advanced monitoring technologies and extensive interpretation of multiple databases.    Rodman Pickle, M.D. [facility] Pulmonary/Critical Care Medicine 03/29/2021 8:31 PM   Please see Amion for pager number to reach on-call Pulmonary and Critical Care Team.

## 2021-03-29 NOTE — Progress Notes (Signed)
RT transported pt from 2M12 to CT and back to 6P59 with no complications. RT will cont to monitor.

## 2021-03-29 NOTE — Progress Notes (Signed)
PIV consult: BUE assessed with ultrasound by 2 VAST RNs. No suitable vein found for PIV or midline placement. Critical Care MD in to evaluate.

## 2021-03-29 NOTE — Progress Notes (Signed)
ANTICOAGULATION CONSULT NOTE - Initial Consult  Pharmacy Consult for Heparin Indication: chest pain/ACS  Allergies  Allergen Reactions   Aspirin Nausea And Vomiting    Patient Measurements: Height: 5\' 2"  (157.5 cm) Weight: (!) 173.7 kg (382 lb 15 oz) IBW/kg (Calculated) : 50.1 Heparin Dosing Weight: 95 kg  Vital Signs: Temp: 99.4 F (37.4 C) (07/16 0700) Temp Source: Oral (07/16 0700) BP: 105/66 (07/16 0730) Pulse Rate: 99 (07/16 1100)  Labs: Recent Labs    03/28/21 0653 03/28/21 0655 03/28/21 0829 03/28/21 2315 03/29/21 0523 03/29/21 0608 03/29/21 1015 03/29/21 1021  HGB 13.9 15.0   < > 13.4 11.6*  --  12.9  --   HCT 45.7 44.0   < > 40.6 34.0*  --  38.0  --   PLT 250  --   --  188  --   --   --   --   LABPROT  --   --   --  12.7  --   --   --   --   INR  --   --   --  1.0  --   --   --   --   HEPARINUNFRC  --   --   --   --   --   --   --  0.32  CREATININE 1.21* 1.10*  --  1.00  --   --   --   --   TROPONINIHS 44*  --   --  1,735*  --  913*  --  576*   < > = values in this interval not displayed.     Estimated Creatinine Clearance: 104.5 mL/min (by C-G formula based on SCr of 1 mg/dL).  Assessment: 50 y.o. F presented unresponsive. Pt now with elevated trop to 1735 and to begin heparin per pharmacy. No AC PTA as best we can tell. Pt was on SQ heparin for VTE prophylaxis- last dose given 7/15 2100. CBC wnl.  Hep lvl within goal  Goal of Therapy:  Heparin level 0.3-0.7 units/ml Monitor platelets by anticoagulation protocol: Yes   Plan:  Heparin gtt at 1350 units/hr - for total of 48 hrs per cards Daily heparin level and CBC  Barth Kirks, PharmD, [place] Pharmacist [phone]  Please check AMION for all [place] numbers  03/29/2021 12:27 PM

## 2021-03-29 NOTE — Progress Notes (Signed)
eLink Physician-Brief Progress Note Patient Name: Lori Bean DOB: 1971/05/02 MRN: [id number]   Date of Service  03/29/2021  HPI/Events of Note  Patient with urinary  retention.  eICU Interventions  Foley catheter protocol ordered.        Kerry Kass Oshua [place] 03/29/2021, 1:38 AM

## 2021-03-29 NOTE — Progress Notes (Signed)
eLink Physician-Brief Progress Note Patient Name: Lori Bean DOB: 01-05-1971 MRN: [id number]   Date of Service  03/29/2021  HPI/Events of Note  Patient needs a central line for medication administration access.  eICU Interventions  PCCM Ground Crew requested to place a central line.        Kerry Kass Carlos [place] 03/29/2021, 3:07 AM

## 2021-03-29 NOTE — Progress Notes (Signed)
ANTICOAGULATION CONSULT NOTE - Initial Consult  Pharmacy Consult for Heparin Indication: chest pain/ACS  Allergies  Allergen Reactions   Aspirin Nausea And Vomiting    Patient Measurements: Height: 5\' 2"  (157.5 cm) Weight: (!) 173.7 kg (382 lb 15 oz) IBW/kg (Calculated) : 50.1 Heparin Dosing Weight: 95 kg  Vital Signs: Temp: 98.9 F (37.2 C) (07/16 0045) Temp Source: Oral (07/16 0045) BP: 114/72 (07/15 2337) Pulse Rate: 85 (07/15 2337)  Labs: Recent Labs    03/28/21 0653 03/28/21 0655 03/28/21 0829 03/28/21 1154 03/28/21 2315  HGB 13.9 15.0 14.3 12.9 13.4  HCT 45.7 44.0 42.0 38.0 40.6  PLT 250  --   --   --  188  LABPROT  --   --   --   --  12.7  INR  --   --   --   --  1.0  CREATININE 1.21* 1.10*  --   --  1.00  TROPONINIHS 44*  --   --   --  1,735*    Estimated Creatinine Clearance: 104.5 mL/min (by C-G formula based on SCr of 1 mg/dL).   Medical History: Past Medical History:  Diagnosis Date   Abdominal hernia    Anginal pain ([place])    occ from asthma   Anxiety    Arthritis    Asthma    Bipolar affective disorder ([place])    Schizophrenia   Bronchitis    hx of   CHF (congestive heart failure) ([place])    Depression    Diabetes mellitus without complication ([place])    " New Onset " per patient   GERD (gastroesophageal reflux disease)    Heart murmur    Hypertension    takes meds   Lymphedema 06/07/2018   Morbid (severe) obesity due to excess calories (HCC)    Neuropathy    left leg , "from back surgery"   Overactive bladder    Schizophrenia ([place])    Sciatica    Shortness of breath    Sleep apnea    Snoring disorder    Pt stated my boyfriend always wakes me up and tells me to breathe    Medications:  Home meds unknown  Assessment: 50 y.o. F presented unresponsive. Pt now with elevated trop to 1735 and to begin heparin per pharmacy. No AC PTA as best we can tell. Pt was on SQ heparin for VTE prophylaxis- last dose given 7/15 2100. CBC  wnl.  Goal of Therapy:  Heparin level 0.3-0.7 units/ml Monitor platelets by anticoagulation protocol: Yes   Plan:  D/C SQ heparin Heparin IV bolus 4000 units Heparin gtt at 1350 units/hr Will f/u heparin level in 6 hours Daily heparin level and CBC  Sherlon Handing, PharmD, BCPS Please see amion for complete clinical pharmacist phone list 03/29/2021,1:02 AM

## 2021-03-30 DIAGNOSIS — G9341 Metabolic encephalopathy: Secondary | ICD-10-CM | POA: Diagnosis not present

## 2021-03-30 DIAGNOSIS — I214 Non-ST elevation (NSTEMI) myocardial infarction: Secondary | ICD-10-CM | POA: Diagnosis not present

## 2021-03-30 DIAGNOSIS — J9602 Acute respiratory failure with hypercapnia: Secondary | ICD-10-CM | POA: Diagnosis not present

## 2021-03-30 DIAGNOSIS — J9601 Acute respiratory failure with hypoxia: Secondary | ICD-10-CM | POA: Diagnosis not present

## 2021-03-30 LAB — GLUCOSE, CAPILLARY
Glucose-Capillary: 117 mg/dL — ABNORMAL HIGH (ref 70–99)
Glucose-Capillary: 125 mg/dL — ABNORMAL HIGH (ref 70–99)
Glucose-Capillary: 167 mg/dL — ABNORMAL HIGH (ref 70–99)
Glucose-Capillary: 168 mg/dL — ABNORMAL HIGH (ref 70–99)
Glucose-Capillary: 177 mg/dL — ABNORMAL HIGH (ref 70–99)
Glucose-Capillary: 87 mg/dL (ref 70–99)

## 2021-03-30 LAB — CBC
HCT: 35.8 % — ABNORMAL LOW (ref 36.0–46.0)
Hemoglobin: 11.6 g/dL — ABNORMAL LOW (ref 12.0–15.0)
MCH: 32.6 pg (ref 26.0–34.0)
MCHC: 32.4 g/dL (ref 30.0–36.0)
MCV: 100.6 fL — ABNORMAL HIGH (ref 80.0–100.0)
Platelets: 147 10*3/uL — ABNORMAL LOW (ref 150–400)
RBC: 3.56 MIL/uL — ABNORMAL LOW (ref 3.87–5.11)
RDW: 14 % (ref 11.5–15.5)
WBC: 11.9 10*3/uL — ABNORMAL HIGH (ref 4.0–10.5)
nRBC: 0 % (ref 0.0–0.2)

## 2021-03-30 LAB — HEPARIN LEVEL (UNFRACTIONATED)
Heparin Unfractionated: 0.12 IU/mL — ABNORMAL LOW (ref 0.30–0.70)
Heparin Unfractionated: 0.24 IU/mL — ABNORMAL LOW (ref 0.30–0.70)
Heparin Unfractionated: 0.26 IU/mL — ABNORMAL LOW (ref 0.30–0.70)

## 2021-03-30 LAB — BASIC METABOLIC PANEL
Anion gap: 5 (ref 5–15)
BUN: 7 mg/dL (ref 6–20)
CO2: 23 mmol/L (ref 22–32)
Calcium: 8.6 mg/dL — ABNORMAL LOW (ref 8.9–10.3)
Chloride: 110 mmol/L (ref 98–111)
Creatinine, Ser: 0.64 mg/dL (ref 0.44–1.00)
GFR, Estimated: >60 mL/min (ref >60)
Glucose, Bld: 184 mg/dL — ABNORMAL HIGH (ref 70–99)
Potassium: 3.9 mmol/L (ref 3.5–5.1)
Sodium: 138 mmol/L (ref 135–145)

## 2021-03-30 LAB — TRIGLYCERIDES: Triglycerides: 125 mg/dL (ref <150)

## 2021-03-30 MED ORDER — SODIUM CHLORIDE 0.9 % IV SOLN
2.0000 g | INTRAVENOUS | Status: DC
  Administered 2021-03-30 – 2021-04-02 (×4): 2 g via INTRAVENOUS
  Filled 2021-03-30 (×5): qty 20

## 2021-03-30 MED ORDER — VITAL HIGH PROTEIN PO LIQD: 1000.0000 mL | ORAL | Status: DC

## 2021-03-30 MED ORDER — INSULIN ASPART 100 UNIT/ML IJ SOLN
0.0000 [IU] | Freq: Three times a day (TID) | INTRAMUSCULAR | Status: DC
  Administered 2021-03-31 – 2021-04-01 (×2): 2 [IU] via SUBCUTANEOUS
  Administered 2021-04-02: 3 [IU] via SUBCUTANEOUS
  Administered 2021-04-02 – 2021-04-03 (×3): 2 [IU] via SUBCUTANEOUS

## 2021-03-30 MED ORDER — ORAL CARE MOUTH RINSE: 15.0000 mL | Freq: Two times a day (BID) | OROMUCOSAL | Status: DC

## 2021-03-30 MED ORDER — PROSOURCE TF PO LIQD
45.0000 mL | Freq: Two times a day (BID) | ORAL | Status: DC
  Administered 2021-03-30: 45 mL
  Filled 2021-03-30: qty 45

## 2021-03-30 MED ORDER — ORAL CARE MOUTH RINSE
15.0000 mL | Freq: Two times a day (BID) | OROMUCOSAL | Status: DC
  Administered 2021-03-30 – 2021-04-01 (×4): 15 mL via OROMUCOSAL

## 2021-03-30 MED ORDER — IPRATROPIUM-ALBUTEROL 0.5-2.5 (3) MG/3ML IN SOLN
3.0000 mL | Freq: Two times a day (BID) | RESPIRATORY_TRACT | Status: DC
  Administered 2021-03-31 – 2021-04-01 (×3): 3 mL via RESPIRATORY_TRACT
  Filled 2021-03-30 (×3): qty 3

## 2021-03-30 NOTE — Progress Notes (Signed)
ANTICOAGULATION CONSULT NOTE   Pharmacy Consult for Heparin Indication: chest pain/ACS  Allergies  Allergen Reactions   Aspirin Nausea And Vomiting    Patient Measurements: Height: 5\' 2"  (157.5 cm) Weight: (!) 173.7 kg (382 lb 15 oz) IBW/kg (Calculated) : 50.1 Heparin Dosing Weight: 95 kg  Vital Signs: Temp: 98.5 F (36.9 C) (07/17 0339) Temp Source: Oral (07/17 0339) BP: 105/67 (07/17 0400) Pulse Rate: 70 (07/17 0400)  Labs: Recent Labs    03/28/21 0653 03/28/21 0655 03/28/21 0829 03/28/21 2315 03/29/21 0523 03/29/21 0608 03/29/21 1015 03/29/21 1021 03/30/21 0349  HGB 13.9 15.0   < > 13.4 11.6*  --  12.9  --  11.6*  HCT 45.7 44.0   < > 40.6 34.0*  --  38.0  --  35.8*  PLT 250  --   --  188  --   --   --   --  147*  LABPROT  --   --   --  12.7  --   --   --   --   --   INR  --   --   --  1.0  --   --   --   --   --   HEPARINUNFRC  --   --   --   --   --   --   --  0.32 0.12*  CREATININE 1.21* 1.10*  --  1.00  --   --   --   --  0.64  TROPONINIHS 44*  --   --  1,735*  --  913*  --  576*  --    < > = values in this interval not displayed.     Estimated Creatinine Clearance: 130.7 mL/min (by C-G formula based on SCr of 0.64 mg/dL).  Assessment: 50 y.o. F presented unresponsive. Pt now with elevated trop to 1735 and to begin heparin per pharmacy. No AC PTA as best we can tell. Pt was on SQ heparin for VTE prophylaxis- last dose given 7/15 2100. CBC wnl.  7/17 AM update:  Heparin level low   Goal of Therapy:  Heparin level 0.3-0.7 units/ml Monitor platelets by anticoagulation protocol: Yes   Plan:  Inc heparin to 1500 units/hr 1300 heparin level  Narda Bonds, PharmD, BCPS Clinical Pharmacist Phone: [phone]

## 2021-03-30 NOTE — Progress Notes (Signed)
ANTICOAGULATION CONSULT NOTE   Pharmacy Consult for Heparin Indication: chest pain/ACS  Allergies  Allergen Reactions   Aspirin Nausea And Vomiting    Patient Measurements: Height: 5\' 2"  (157.5 cm) Weight: 125.5 kg (276 lb 10.8 oz) IBW/kg (Calculated) : 50.1 Heparin Dosing Weight: 95 kg  Vital Signs: Temp: 98.3 F (36.8 C) (07/17 1900) Temp Source: Oral (07/17 1900) BP: 165/78 (07/17 2000) Pulse Rate: 90 (07/17 2000)  Labs: Recent Labs    03/28/21 0653 03/28/21 0655 03/28/21 0829 03/28/21 2315 03/29/21 0523 03/29/21 0608 03/29/21 1015 03/29/21 1021 03/29/21 1021 03/30/21 0349 03/30/21 1206 03/30/21 1953  HGB 13.9 15.0   < > 13.4 11.6*  --  12.9  --   --  11.6*  --   --   HCT 45.7 44.0   < > 40.6 34.0*  --  38.0  --   --  35.8*  --   --   PLT 250  --   --  188  --   --   --   --   --  147*  --   --   LABPROT  --   --   --  12.7  --   --   --   --   --   --   --   --   INR  --   --   --  1.0  --   --   --   --   --   --   --   --   HEPARINUNFRC  --   --   --   --   --   --   --  0.32   < > 0.12* 0.24* 0.26*  CREATININE 1.21* 1.10*  --  1.00  --   --   --   --   --  0.64  --   --   TROPONINIHS 44*  --   --  1,735*  --  913*  --  576*  --   --   --   --    < > = values in this interval not displayed.     Estimated Creatinine Clearance: 105.5 mL/min (by C-G formula based on SCr of 0.64 mg/dL).  Assessment: 50 y.o. F presented unresponsive. Pt now with elevated trop to 1735 and to begin heparin per pharmacy. No AC PTA as best we can tell. Pt was on SQ heparin for VTE prophylaxis - last dose given 7/15 2100.   Heparin level this evening remains subtherapeutic at 0.26, on 1650 units/hr. Of note, Hb 11.6 down from BL (~13.5). No s/sx of bleeding or infusion issues per nursing.   Goal of Therapy:  Heparin level 0.3-0.7 units/ml Monitor platelets by anticoagulation protocol: Yes   Plan:  Increase heparin to 1800 units/hr Order heparin level in 6-5 hr Daily heparin  level, CBC  Antonietta Jewel, PharmD, [place] Pharmacist  Phone: [phone] 03/30/2021 9:02 PM  Please check AMION for all [place] phone numbers After 10:00 PM, call [place] [phone]

## 2021-03-30 NOTE — Evaluation (Signed)
Clinical/Bedside Swallow Evaluation Patient Details  Name: Lori Bean MRN: [id number] Date of Birth: 1971-05-30  Today's Date: 03/30/2021 Time: SLP Start Time (ACUTE ONLY): 1300 SLP Stop Time (ACUTE ONLY): 1315 SLP Time Calculation (min) (ACUTE ONLY): 15 min  Past Medical History:  Past Medical History:  Diagnosis Date   Abdominal hernia    Anginal pain ([place])    occ from asthma   Anxiety    Arthritis    Asthma    Bipolar affective disorder ([place])    Schizophrenia   Bronchitis    hx of   CHF (congestive heart failure) ([place])    Depression    Diabetes mellitus without complication ([place])    " New Onset " per patient   GERD (gastroesophageal reflux disease)    Heart murmur    Hypertension    takes meds   Lymphedema 06/07/2018   Morbid (severe) obesity due to excess calories (HCC)    Neuropathy    left leg , "from back surgery"   Overactive bladder    Schizophrenia ([place])    Sciatica    Shortness of breath    Sleep apnea    Snoring disorder    Pt stated my boyfriend always wakes me up and tells me to breathe   Past Surgical History:  Past Surgical History:  Procedure Laterality Date   BACK SURGERY     3 back surgeries   CHOLECYSTECTOMY     COLONOSCOPY WITH PROPOFOL N/A 01/23/2016   Procedure: COLONOSCOPY WITH PROPOFOL;  Surgeon: Wonda Horner, MD;  Location: [facility];  Service: Endoscopy;  Laterality: N/A;   EYE SURGERY     Metal plate in right eye. Had fracture in right eye   gallstones reomved     KNEE ARTHROSCOPY WITH MENISCAL REPAIR Right 09/28/2016   Procedure: KNEE ARTHROSCOPY WITH MENISCAL REPAIR;  Surgeon: Dorna Leitz, MD;  Location: [facility];  Service: Orthopedics;  Laterality: Right;  Right partial meniscectomy and chondroplasty, patellar/femoral joint and medial femoral condyle    LUMBAR LAMINECTOMY/DECOMPRESSION MICRODISCECTOMY  04/01/2012   Procedure: LUMBAR LAMINECTOMY/DECOMPRESSION MICRODISCECTOMY 2 LEVELS;  Surgeon: Faythe Ghee, MD;  Location:  [facility];  Service: Neurosurgery;  Laterality: Left;  Lumbar four-five, lumbar five sacral one microdiscectomy    LUMBAR WOUND DEBRIDEMENT  04/29/2012   Procedure: LUMBAR WOUND DEBRIDEMENT;  Surgeon: Faythe Ghee, MD;  Location: [facility];  Service: Neurosurgery;  Laterality: N/A;  lumbar wound debridement   ROTATOR CUFF REPAIR     Right shoulder   TUBAL LIGATION     HPI:  Patient is a 50 y.o. female with PMH: asthma with frequent admissions this year for exacerbations, OSA, tobacco use, anxiety, bipolar, schizophrenia, DM-2, GERD, morbid obesity who presented to [facility] on 7/15 with AMS after  being found unresponsive at home by her significant other. When EMS arrived, patient was unresponsive, hypotensive and in respiratory distress. Breathing was assisted with BVM and Narcan administered. She was intubated in ED on 7/15 and extubated 7/17.   Assessment / Plan / Recommendation Clinical Impression  Patient presents with a mild oropharyngeal dysphagia with tested boluses of thin liquids via cup and straw sips. She did exhibit intermittent and mild intensity throat clearing which at times did lead to cough and attempts to expectorate. Patient endorsed mild throat pain but no discomfort during swallow. Voice was low in intensity and hoarse but no changes in vocal quality or in vitals (RR remained in range of 17-21) and oxygen saturations WNL.  Patient appears to be tolerating clear liquids diet and SLP to see patient for trials of upgraded solids. SLP Visit Diagnosis: Dysphagia, unspecified (R13.10)    Aspiration Risk  Mild aspiration risk    Diet Recommendation Thin liquid   Liquid Administration via: Cup;Straw Medication Administration: Crushed with puree Supervision: Patient able to self feed;Intermittent supervision to cue for compensatory strategies Compensations: Small sips/bites;Slow rate Postural Changes: Seated upright at 90 degrees    Other  Recommendations Oral Care  Recommendations: Oral care BID   Follow up Recommendations None      Frequency and Duration min 2x/week  1 week       Prognosis Prognosis for Safe Diet Advancement: Good      Swallow Study   General Date of Onset: 03/28/21 HPI: Patient is a 50 y.o. female with PMH: asthma with frequent admissions this year for exacerbations, OSA, tobacco use, anxiety, bipolar, schizophrenia, DM-2, GERD, morbid obesity who presented [facility]15 with AMS after  being found unresponsive at home by her significant other. When EMS arrived, patient was unresponsive, hypotensive and in respiratory distress. Breathing was assisted with BVM and Narcan administered. She was intubated in ED on 7/15 and extubated 7/17. Type of Study: Bedside Swallow Evaluation Previous Swallow Assessment: none found Diet Prior to this Study: Thin liquids Temperature Spikes Noted: No Respiratory Status: Nasal cannula History of Recent Intubation: Yes Length of Intubations (days): 3 days Date extubated: 03/30/21 Behavior/Cognition: Alert;Cooperative;Pleasant mood Oral Cavity Assessment: Within Functional Limits Oral Care Completed by SLP: Yes Oral Cavity - Dentition: Adequate natural dentition Vision: Functional for self-feeding Self-Feeding Abilities: Able to feed self Patient Positioning: Upright in bed Baseline Vocal Quality: Hoarse;Low vocal intensity Volitional Cough: Weak Volitional Swallow: Able to elicit    Oral/Motor/Sensory Function Overall Oral Motor/Sensory Function: Within functional limits   Ice Chips     Thin Liquid Thin Liquid: Impaired Pharyngeal  Phase Impairments: Throat Clearing - Delayed Other Comments: mild intermittent throat clearing which led to coughing and attempted expectoration    Nectar Thick     Honey Thick     Puree Puree: Not tested   Solid     Solid: Not tested      Sonia Baller, MA, CCC-SLP Speech Therapy [facility] Acute Rehab

## 2021-03-30 NOTE — Progress Notes (Signed)
ANTICOAGULATION CONSULT NOTE   Pharmacy Consult for Heparin Indication: chest pain/ACS  Allergies  Allergen Reactions   Aspirin Nausea And Vomiting    Patient Measurements: Height: 5\' 2"  (157.5 cm) Weight: 125.5 kg (276 lb 10.8 oz) IBW/kg (Calculated) : 50.1 Heparin Dosing Weight: 95 kg  Vital Signs: Temp: 98.4 F (36.9 C) (07/17 0700) Temp Source: Oral (07/17 0700) BP: 132/89 (07/17 1100) Pulse Rate: 70 (07/17 1100)  Labs: Recent Labs    03/28/21 0653 03/28/21 0655 03/28/21 0829 03/28/21 2315 03/29/21 0523 03/29/21 0608 03/29/21 1015 03/29/21 1021 03/30/21 0349  HGB 13.9 15.0   < > 13.4 11.6*  --  12.9  --  11.6*  HCT 45.7 44.0   < > 40.6 34.0*  --  38.0  --  35.8*  PLT 250  --   --  188  --   --   --   --  147*  LABPROT  --   --   --  12.7  --   --   --   --   --   INR  --   --   --  1.0  --   --   --   --   --   HEPARINUNFRC  --   --   --   --   --   --   --  0.32 0.12*  CREATININE 1.21* 1.10*  --  1.00  --   --   --   --  0.64  TROPONINIHS 44*  --   --  1,735*  --  913*  --  576*  --    < > = values in this interval not displayed.     Estimated Creatinine Clearance: 105.5 mL/min (by C-G formula based on SCr of 0.64 mg/dL).  Assessment: 50 y.o. F presented unresponsive. Pt now with elevated trop to 1735 and to begin heparin per pharmacy. No AC PTA as best we can tell. Pt was on SQ heparin for VTE prophylaxis - last dose given 7/15 2100.   Heparin level still subtherapeutic at 0.24 after 150 unit/hr increase this morning. Of note, Hb 11.6 down from BL (~13.5) and platelets also down 147 from 188. No s/sx of bleeding.   Goal of Therapy:  Heparin level 0.3-0.7 units/ml Monitor platelets by anticoagulation protocol: Yes   Plan:  Increase heparin to 1650 units/hr 6h heparin level @ 2000 Daily heparin level, CBC  Laurey Arrow, PharmD PGY1 Pharmacy Resident 03/30/2021  12:28 PM  Please check [email or web address] for unit-specific pharmacy phone numbers.

## 2021-03-30 NOTE — Progress Notes (Signed)
NAME:  Lori Bean, MRN:  [id number], DOB:  08/23/1971, LOS: 2 ADMISSION DATE:  03/28/2021, CONSULTATION DATE:  7/15 REFERRING MD:  Dr. Sherry Ruffing, CHIEF COMPLAINT:  AMS   History of Present Illness:  Patient is a 50 yo F w/ PMH of asthma with frequent admissions this year for exacerbations, OSA (patient of Dr. [name]), smoker, anxiety, bipolar, schizophrenia, DM2, GERD, HTN, morbid obesity presents to [facility] on 7/15 with AMS.  On 7/15, patient's significant other found patient unresponsive. Was speaking to them 10 minutes prior. EMS called found patient unresponsive, hypotensive, and in respiratory distress. Breathing assisted with BVM. Administered Narcan.   7/15 ED course: patient was hypertensive, agitated, and still being assisted with BVM. Intubated and sedation started. BP improved post intubation. CT head negative. Neuro consulted and ordered MRI. UDS, UA pending. Bcx2 pending. ABG 7.16, 70, 382, 25.7. Increased RR on mech vent to 28. Glucose 341, co2 16, creat 1.21, anion gap 19. Elevated Lfts, Ammonia 189. Lactic acid >11. WBC 26. Afebrile. Troponin 44.  PCCM consulted for admission and medical management.  Pertinent  Medical History   Past Medical History:  Diagnosis Date   Abdominal hernia    Anginal pain ([place])    occ from asthma   Anxiety    Arthritis    Asthma    Bipolar affective disorder ([place])    Schizophrenia   Bronchitis    hx of   CHF (congestive heart failure) ([place])    Depression    Diabetes mellitus without complication ([place])    " New Onset " per patient   GERD (gastroesophageal reflux disease)    Heart murmur    Hypertension    takes meds   Lymphedema 06/07/2018   Morbid (severe) obesity due to excess calories ([facility])    Neuropathy    left leg , "from back surgery"   Overactive bladder    Schizophrenia ([place])    Sciatica    Shortness of breath    Sleep apnea    Snoring disorder    Pt stated my boyfriend always wakes me up and tells me to breathe      Significant Hospital Events: Including procedures, antibiotic start and stop dates in addition to other pertinent events   7/15: admitted to [facility] for acute metabolic encephalopahty; intubated; CT head neg 7/16: more alert. MRI brain neg. Weaning sedation and PS as tolerated 7/17 Extubated  Interim History / Subjective:   Passed SBT. Alert and following commands. Extubated. Daughters updated at bedside  Objective   Blood pressure 130/77, pulse 66, temperature 98.4 F (36.9 C), temperature source Oral, resp. rate 17, height 5\' 2"  (1.575 m), weight 125.5 kg, last menstrual period 10/15/2018, SpO2 97 %.    Vent Mode: PSV;CPAP FiO2 (%):  [30 %-40 %] 30 % Set Rate:  [30 bmp] 30 bmp Vt Set:  [400 mL] 400 mL PEEP:  [5 cmH20] 5 cmH20 Pressure Support:  [5 cmH20-8 cmH20] 5 cmH20 Plateau Pressure:  [23 cmH20] 23 cmH20   Intake/Output Summary (Last 24 hours) at 03/30/2021 1013 Last data filed at 03/30/2021 1000 Gross per 24 hour  Intake 2216.68 ml  Output 1150 ml  Net 1066.68 ml   Filed Weights   03/28/21 0700 03/28/21 0722 03/30/21 0500  Weight: 117.9 kg (!) 173.7 kg 125.5 kg   Physical Exam: General: Critically ill-appearing, no acute distress HENT: [place], AT, ETT in place Eyes: EOMI, no scleral icterus Respiratory: Clear to auscultation bilaterally.  No crackles, wheezing or rales  Cardiovascular: RRR, -M/R/G, no JVD GI: BS+, soft, nontender Extremities:-Edema,-tenderness Neuro: Awake, following commands, moves extremities x 4 GU: Foley in place  CT head negative MRI Brain normal  Labs elevated glucose, mild leukocytosis, anemia, mild thrombocytopenia Peak trop 1735  Imaging, labs and test noted above have been reviewed independently by me.  Resolved Hospital Problem list   Acute metabolic encephalopathy secondary acute hypercarbic and hypoxemic respiratory failure  Assessment & Plan:  Acute metabolic encephalopathy - resolved Secondary to hypercarbia; UDS +cannabis.  MRI brain normal; CT head negative P: -ABG PRN -DC lactulose -Appreciate Neuro involvement : Recommend B12 supplementation for goal >400  Acute respiratory failure with hypercapnia and hypoxemia S/p intubated 7/15-7/17 Hx of asthma, OSA: on breztri, patient of Dr. [name] Smoker : on chantix P: -Extubate -Continue ceftriaxone. DC Vanc -Continue scheduled pulmicort and duonebs -singulair -nicotine patch  Elevated troponin Echo with normal LV function, no WMA P: -Appreciate cardiology input -Heparin gtt x 48 hours -ASA, statin  Hyperglycemia DM2 P: -SSI and CBG monitoring -A1C ordered  Chronic HTN  Currently normotensive P: -hold home meds; prn hydralazine ordered -Consider restarting tomorrow  GERD P: -PPI  Hx of bipolar, schizo P: -hold abilify, duloxetine, topiramate -consider restarting tomorrow  Best Practice (right click and "Reselect all SmartList Selections" daily)   Diet/type: NPO. Advance after extubation DVT prophylaxis: systemic heparin GI prophylaxis: PPI Lines: Central line. If stable, consider DC this afternoon or tomorrow Foley:  N/A and Yes, and it is still needed. Consider voiding trial in 1-2 days Code Status:  full code Last date of multidisciplinary goals of care discussion - daughters at bedside 7/17 Labs   CBC: Recent Labs  Lab 03/28/21 0653 03/28/21 0655 03/28/21 1154 03/28/21 2315 03/29/21 0523 03/29/21 1015 03/30/21 0349  WBC 26.2*  --   --  9.9  --   --  11.9*  NEUTROABS 10.5*  --   --   --   --   --   --   HGB 13.9   < > 12.9 13.4 11.6* 12.9 11.6*  HCT 45.7   < > 38.0 40.6 34.0* 38.0 35.8*  MCV 105.5*  --   --  99.5  --   --  100.6*  PLT 250  --   --  188  --   --  147*   < > = values in this interval not displayed.    Basic Metabolic Panel: Recent Labs  Lab 03/28/21 0653 03/28/21 0655 03/28/21 0829 03/28/21 1154 03/28/21 2315 03/29/21 0523 03/29/21 1015 03/30/21 0349  NA 136 135   < > 137 140 140 141 138  K  5.1 5.0   < > 3.6 3.5 3.3* 3.3* 3.9  CL 101 105  --   --  105  --   --  110  CO2 16*  --   --   --  26  --   --  23  GLUCOSE 341* 335*  --   --  131*  --   --  184*  BUN 13 15  --   --  13  --   --  7  CREATININE 1.21* 1.10*  --   --  1.00  --   --  0.64  CALCIUM 9.1  --   --   --  9.0  --   --  8.6*  MG  --   --   --   --  2.1  --   --   --    < > =  values in this interval not displayed.   GFR: Estimated Creatinine Clearance: 105.5 mL/min (by C-G formula based on SCr of 0.64 mg/dL). Recent Labs  Lab 03/28/21 0653 03/28/21 2315 03/30/21 0349  WBC 26.2* 9.9 11.9*  LATICACIDVEN >11.0* 1.4  --     Liver Function Tests: Recent Labs  Lab 03/28/21 0653 03/28/21 2315  AST 102* 99*  ALT 96* 157*  ALKPHOS 80 71  BILITOT 0.5 0.6  PROT 6.3* 6.4*  ALBUMIN 3.2* 3.1*   No results for input(s): LIPASE, AMYLASE in the last 168 hours. Recent Labs  Lab 03/28/21 0653 03/28/21 2315  AMMONIA 189* 30    ABG    Component Value Date/Time   PHART 7.306 (L) 03/29/2021 1015   PCO2ART 46.9 03/29/2021 1015   PO2ART 72 (L) 03/29/2021 1015   HCO3 23.3 03/29/2021 1015   TCO2 25 03/29/2021 1015   ACIDBASEDEF 3.0 (H) 03/29/2021 1015   O2SAT 92.0 03/29/2021 1015     Coagulation Profile: Recent Labs  Lab 03/28/21 2315  INR 1.0    Cardiac Enzymes: No results for input(s): CKTOTAL, CKMB, CKMBINDEX, TROPONINI in the last 168 hours.  HbA1C: Hgb A1c MFr Bld  Date/Time Value Ref Range Status  03/28/2021 11:15 PM 7.2 (H) 4.8 - 5.6 % Final    Comment:    (NOTE) Pre diabetes:          5.7%-6.4%  Diabetes:              >6.4%  Glycemic control for   <7.0% adults with diabetes   12/16/2020 12:50 AM 6.6 (H) 4.8 - 5.6 % Final    Comment:    (NOTE) Pre diabetes:          5.7%-6.4%  Diabetes:              >6.4%  Glycemic control for   <7.0% adults with diabetes     CBG: Recent Labs  Lab 03/29/21 1559 03/29/21 1929 03/30/21 0002 03/30/21 0339 03/30/21 0709  GLUCAP 136* 188*  168* 177* 167*    Critical care time: 48 min    The patient is critically ill with multiple organ systems failure and requires high complexity decision making for assessment and support, frequent evaluation and titration of therapies, application of advanced monitoring technologies and extensive interpretation of multiple databases.  Independent Critical Care Time: 48 Minutes.   Rodman Pickle, M.D. [facility] Pulmonary/Critical Care Medicine 03/30/2021 10:14 AM   Please see Amion for pager number to reach on-call Pulmonary and Critical Care Team.

## 2021-03-30 NOTE — Progress Notes (Signed)
Cardiology Progress Note  Patient ID: Lori Bean MRN: [id number] DOB: [birth year] Date of Encounter: 03/30/2021  Primary Cardiologist: [name], MD  Subjective   Chief Complaint: None.  On the ventilator.  HPI: Doing well on spontaneous breathing trial.  Telemetry unremarkable.  Echocardiogram shows normal LV function.  No regional wall motion normality.  ROS:  All other ROS reviewed and negative. Pertinent positives noted in the HPI.     Inpatient Medications  Scheduled Meds:  aspirin  81 mg Per Tube Daily   atorvastatin  80 mg Per Tube Daily   budesonide (PULMICORT) nebulizer solution  0.25 mg Nebulization BID   chlorhexidine gluconate (MEDLINE KIT)  15 mL Mouth Rinse BID   Chlorhexidine Gluconate Cloth  6 each Topical Daily   docusate  100 mg Per Tube BID   feeding supplement (PROSource TF)  45 mL Per Tube BID   feeding supplement (VITAL HIGH PROTEIN)  1,000 mL Per Tube Q24H   insulin aspart  0-15 Units Subcutaneous Q4H   ipratropium-albuterol  3 mL Nebulization Q6H   mouth rinse  15 mL Mouth Rinse 10 times per day   montelukast  10 mg Per Tube QHS   nicotine  7 mg Transdermal Daily   pantoprazole sodium  40 mg Per Tube Daily   polyethylene glycol  17 g Per Tube Daily   vitamin B-12  500 mcg Per Tube Daily   Continuous Infusions:  sodium chloride 10 mL/hr at 03/30/21 0600   cefTRIAXone (ROCEPHIN)  IV Stopped (03/29/21 1444)   dexmedetomidine (PRECEDEX) IV infusion 0.8 mcg/kg/hr (03/30/21 0600)   fentaNYL infusion INTRAVENOUS 100 mcg/hr (03/30/21 0600)   heparin 1,500 Units/hr (03/30/21 0600)   propofol (DIPRIVAN) infusion Stopped (03/29/21 1526)   PRN Meds: sodium chloride, acetaminophen, docusate, fentaNYL, hydrALAZINE, midazolam   Vital Signs   Vitals:   03/30/21 0400 03/30/21 0500 03/30/21 0600 03/30/21 0700  BP: 105/67 122/75 125/78   Pulse: 70 68 66   Resp: _0 Temp:    98.4 F (36.9 C)  TempSrc:    Oral  SpO2: 93% 96% 96%    Weight:  125.5 kg    Height:        Intake/Output Summary (Last 24 hours) at 03/30/2021 0753 Last data filed at 03/30/2021 0600 Gross per 24 hour  Intake 2341.13 ml  Output 1000 ml  Net 1341.13 ml   Last 3 Weights 03/30/2021 03/28/2021 03/28/2021  Weight (lbs) 276 lb 10.8 oz 382 lb 15 oz 260 lb  Weight (kg) 125.5 kg 173.7 kg 117.935 kg      Telemetry  Overnight telemetry shows sinus rhythm in the 70s, which I personally reviewed.   ECG  The most recent ECG shows normal sinus rhythm heart rate 86, no acute ischemic changes or evidence of infarction, which I personally reviewed.   Physical Exam   Vitals:   03/30/21 0400 03/30/21 0500 03/30/21 0600 03/30/21 0700  BP: 105/67 122/75 125/78   Pulse: 70 68 66   Resp: _1 Temp:    98.4 F (36.9 C)  TempSrc:    Oral  SpO2: 93% 96% 96%   Weight:  125.5 kg    Height:        Intake/Output Summary (Last 24 hours) at 03/30/2021 0753 Last data filed at 03/30/2021 0600 Gross per 24 hour  Intake 2341.13 ml  Output 1000 ml  Net 1341.13 ml    Last 3 Weights 03/30/2021 03/28/2021 03/28/2021  Weight (  lbs) 276 lb 10.8 oz 382 lb 15 oz 260 lb  Weight (kg) 125.5 kg 173.7 kg 117.935 kg    Body mass index is 50.6 kg/m.  General: Intubated on the ventilator, morbidly obese Head: Atraumatic, normal size  Eyes: PEERLA, EOMI  Neck: Supple, no JVD Endocrine: No thryomegaly Cardiac: Normal S1, S2; RRR; no murmurs, rubs, or gallops Lungs: Scattered wheezing Abd: Soft, nontender, no hepatomegaly  Ext: No edema, pulses 2+ Musculoskeletal: No deformities Skin: Warm and dry, no rashes   Neuro: Intubated on the vent  Labs  High Sensitivity Troponin:   Recent Labs  Lab 03/12/21 1822 03/28/21 0653 03/28/21 2315 03/29/21 0608 03/29/21 1021  TROPONINIHS 4 44* 1,735* 913* 576*     Cardiac EnzymesNo results for input(s): TROPONINI in the last 168 hours. No results for input(s): TROPIPOC in the last 168 hours.  Chemistry Recent Labs  Lab  03/28/21 0653 03/28/21 0655 03/28/21 0829 03/28/21 2315 03/29/21 0523 03/29/21 1015 03/30/21 0349  NA 136 135   < > 140 140 141 138  K 5.1 5.0   < > 3.5 3.3* 3.3* 3.9  CL 101 105  --  105  --   --  110  CO2 16*  --   --  26  --   --  23  GLUCOSE 341* 335*  --  131*  --   --  184*  BUN 13 15  --  13  --   --  7  CREATININE 1.21* 1.10*  --  1.00  --   --  0.64  CALCIUM 9.1  --   --  9.0  --   --  8.6*  PROT 6.3*  --   --  6.4*  --   --   --   ALBUMIN 3.2*  --   --  3.1*  --   --   --   AST 102*  --   --  99*  --   --   --   ALT 96*  --   --  157*  --   --   --   ALKPHOS 80  --   --  71  --   --   --   BILITOT 0.5  --   --  0.6  --   --   --   GFRNONAA 54*  --   --  >60  --   --  >60  ANIONGAP 19*  --   --  9  --   --  5   < > = values in this interval not displayed.    Hematology Recent Labs  Lab 03/28/21 0653 03/28/21 0655 03/28/21 2315 03/29/21 0523 03/29/21 1015 03/30/21 0349  WBC 26.2*  --  9.9  --   --  11.9*  RBC 4.33  --  4.08  --   --  3.56*  HGB 13.9   < > 13.4 11.6* 12.9 11.6*  HCT 45.7   < > 40.6 34.0* 38.0 35.8*  MCV 105.5*  --  99.5  --   --  100.6*  MCH 32.1  --  32.8  --   --  32.6  MCHC 30.4  --  33.0  --   --  32.4  RDW 13.8  --  14.1  --   --  14.0  PLT 250  --  188  --   --  147*   < > = values in this interval not displayed.   BNP Recent  Labs  Lab 03/28/21 0653  BNP 43.4    DDimer No results for input(s): DDIMER in the last 168 hours.   Radiology  CT HEAD WO CONTRAST  Result Date: 03/28/2021 CLINICAL DATA:  Mental status change EXAM: CT HEAD WITHOUT CONTRAST TECHNIQUE: Contiguous axial images were obtained from the base of the skull through the vertex without intravenous contrast. COMPARISON:  09/20/2018 FINDINGS: Brain: There is no acute intracranial hemorrhage, mass effect, or edema. Gray-white differentiation is preserved. There is no extra-axial fluid collection. Ventricles and sulci are within normal limits in size and configuration.  Vascular: No hyperdense vessel or unexpected calcification. Skull: Calvarium is unremarkable. Sinuses/Orbits: Likely odontogenic mild right maxillary sinus mucosal thickening with posterior right maxillary molar periapical lucencies. Patchy ethmoid mucosal thickening. Nasal cavity opacification. No acute orbital abnormality. Other: Question periorbital soft tissue swelling. Mastoid air cells are clear. IMPRESSION: No acute intracranial abnormality. Question bilateral periorbital soft tissue swelling. Electronically Signed   By: Macy Mis M.D.   On: 03/28/2021 09:07   CT CERVICAL SPINE WO CONTRAST  Result Date: 03/28/2021 CLINICAL DATA:  Unresponsive EXAM: CT CERVICAL SPINE WITHOUT CONTRAST TECHNIQUE: Multidetector CT imaging of the cervical spine was performed without intravenous contrast. Multiplanar CT image reconstructions were also generated. COMPARISON:  None. FINDINGS: Alignment: Preserved. Skull base and vertebrae: No acute cervical spine fracture. Vertebral body heights are maintained. Bulky anterior osteophytes. Soft tissues and spinal canal: No prevertebral fluid or swelling. No visible canal hematoma. Disc levels:  Mild degenerative changes are present. Upper chest: No acute abnormality. Other: Trace calcified plaque at the left common carotid bifurcation. IMPRESSION: No acute cervical spine fracture. Electronically Signed   By: Macy Mis M.D.   On: 03/28/2021 09:11   MR BRAIN W WO CONTRAST  Result Date: 03/29/2021 CLINICAL DATA:  Seizure nontraumatic. EXAM: MRI HEAD WITHOUT AND WITH CONTRAST TECHNIQUE: Multiplanar, multiecho pulse sequences of the brain and surrounding structures were obtained without and with intravenous contrast. CONTRAST:  26m GADAVIST GADOBUTROL 1 MMOL/ML IV SOLN COMPARISON:  CT head 03/28/2021 FINDINGS: Brain: No acute infarction, hemorrhage, hydrocephalus, extra-axial collection or mass lesion. Normal cerebral white matter. Normal temporal lobe anatomy and  signal. Normal enhancement following contrast infusion. Vascular: Normal arterial flow voids. Skull and upper cervical spine: Negative Sinuses/Orbits: Mucosal edema paranasal sinuses. Air-fluid levels in the sphenoid sinus. Mild mastoid effusion bilaterally Other: None IMPRESSION: Normal MRI brain with contrast Mucosal edema in the paranasal sinuses and mastoid sinus bilaterally. Electronically Signed   By: CFranchot GalloM.D.   On: 03/29/2021 12:54   DG CHEST PORT 1 VIEW  Result Date: 03/29/2021 CLINICAL DATA:  50year old female central line placement. EXAM: PORTABLE CHEST 1 VIEW COMPARISON:  CT Chest, Abdomen, and Pelvis today 0113 hours. Portable chest 03/28/2021. FINDINGS: Portable AP semi upright view at 0416 hours. Left subclavian approach central line in place, tip is below the carina at the level of the lower SVC. Otherwise stable lines and tubes. Note that the enteric tube side hole is at the level of the gastroesophageal junction. No pneumothorax. Lower lung volumes since yesterday. Stable cardiac size and mediastinal contours. Confluent lower lobe opacity better demonstrated by CT this morning. Paucity of bowel gas in the upper abdomen. IMPRESSION: 1. Left subclavian central line placed, tip at the lower SVC level. No pneumothorax. 2. Enteric tube side hole at the GEJ level. Advanced 6 cm to ensure side hole placement within the stomach. Electronically Signed   By: HGenevie AnnM.D.   On: 03/29/2021  04:32   EEG adult  Result Date: 03/28/2021 Lora Havens, MD     03/28/2021  2:08 PM Patient Name: Lori Bean MRN: [id number] Epilepsy Attending: Lora Havens Referring Physician/Provider: Dr Lesleigh Noe Date: 03/28/2021 Duration: 23.03 mins Patient history: a 51 year old female who presents with unresponsiveness. EEG to evaluate for seizure Level of alertness: lethargic AEDs during EEG study:  None Technical aspects: This EEG study was done with scalp electrodes positioned according to  the 10-20 International system of electrode placement. Electrical activity was acquired at a sampling rate of _0  and reviewed with a high frequency filter of _1  and a low frequency filter of _2 . EEG data were recorded continuously and digitally stored. Description: No clear posterior dominant rhythm was seen. EEG showed continuous generalized 5-_3  theta-alpha as well as intermittent generalized 2-_4  delta slowing. Hyperventilation and photic stimulation were not performed.   ABNORMALITY - Continuous slow, generalized IMPRESSION: This study is suggestive of moderate diffuse encephalopathy, nonspecific etiology but likely related to sedation. No seizures or epileptiform discharges were seen throughout the recording. Lora Havens   ECHOCARDIOGRAM COMPLETE  Result Date: 03/29/2021    ECHOCARDIOGRAM REPORT   Patient Name:   Lori Bean Date of Exam: 03/29/2021 Medical Rec #:  [id number]            Height:       62.0 in Accession #:    [id number]           Weight:       382.9 lb Date of Birth:  1971-09-02            BSA:          2.519 m Patient Age:    56 years             BP:           105/66 mmHg Patient Gender: F                    HR:           82 bpm. Exam Location:  Inpatient Procedure: 2D Echo Indications:    acute ischemic heart disease  History:        Patient has prior history of Echocardiogram examinations, most                 recent 04/14/2018. Signs/Symptoms:elevated troponin.  Sonographer:    Johny Chess Referring Phys: [id number] Frederik Pear  Sonographer Comments: Echo performed with patient supine and on artificial respirator. Image acquisition challenging due to patient body habitus. IMPRESSIONS  1. Left ventricular ejection fraction, by estimation, is 60 to 65%. The left ventricle has normal function. The left ventricle has no regional wall motion abnormalities. Left ventricular diastolic parameters were normal.  2. Right ventricular systolic function is normal. The right  ventricular size is normal.  3. The mitral valve is normal in structure. No evidence of mitral valve regurgitation. No evidence of mitral stenosis.  4. The aortic valve is normal in structure. Aortic valve regurgitation is not visualized. No aortic stenosis is present.  5. The inferior vena cava is normal in size with greater than 50% respiratory variability, suggesting right atrial pressure of 3 mmHg. Comparison(s): Prior images unable to be directly viewed, comparison made by report only. FINDINGS  Left Ventricle: Left ventricular ejection fraction, by estimation, is 60 to 65%. The left ventricle has normal function. The left ventricle has no regional wall motion abnormalities. The  left ventricular internal cavity size was normal in size. There is  no left ventricular hypertrophy. Left ventricular diastolic parameters were normal. Right Ventricle: The right ventricular size is normal. No increase in right ventricular wall thickness. Right ventricular systolic function is normal. Left Atrium: Left atrial size was normal in size. Right Atrium: Right atrial size was normal in size. Pericardium: There is no evidence of pericardial effusion. Mitral Valve: The mitral valve is normal in structure. No evidence of mitral valve regurgitation. No evidence of mitral valve stenosis. Tricuspid Valve: The tricuspid valve is normal in structure. Tricuspid valve regurgitation is not demonstrated. No evidence of tricuspid stenosis. Aortic Valve: The aortic valve is normal in structure. Aortic valve regurgitation is not visualized. No aortic stenosis is present. Aortic valve mean gradient measures 8.0 mmHg. Aortic valve peak gradient measures 15.1 mmHg. Aortic valve area, by VTI measures 1.92 cm. Pulmonic Valve: The pulmonic valve was normal in structure. Pulmonic valve regurgitation is not visualized. No evidence of pulmonic stenosis. Aorta: The aortic root is normal in size and structure. Venous: The inferior vena cava is normal  in size with greater than 50% respiratory variability, suggesting right atrial pressure of 3 mmHg. IAS/Shunts: No atrial level shunt detected by color flow Doppler.  LEFT VENTRICLE PLAX 2D LVIDd:         4.60 cm  Diastology LVIDs:         2.90 cm  LV e' medial:    7.72 cm/s LV PW:         1.10 cm  LV E/e' medial:  11.4 LV IVS:        1.05 cm  LV e' lateral:   6.64 cm/s LVOT diam:     1.90 cm  LV E/e' lateral: 13.3 LV SV:         64 LV SV Index:   25 LVOT Area:     2.84 cm  RIGHT VENTRICLE RV S prime:     17.80 cm/s TAPSE (M-mode): 2.3 cm LEFT ATRIUM             Index       RIGHT ATRIUM           Index LA diam:        4.00 cm 1.59 cm/m  RA Area:     18.80 cm LA Vol (A2C):   51.0 ml 20.24 ml/m RA Volume:   52.90 ml  21.00 ml/m LA Vol (A4C):   54.3 ml 21.55 ml/m LA Biplane Vol: 52.4 ml 20.80 ml/m  AORTIC VALVE AV Area (Vmax):    2.13 cm AV Area (Vmean):   1.95 cm AV Area (VTI):     1.92 cm AV Vmax:           194.00 cm/s AV Vmean:          132.000 cm/s AV VTI:            0.332 m AV Peak Grad:      15.1 mmHg AV Mean Grad:      8.0 mmHg LVOT Vmax:         146.00 cm/s LVOT Vmean:        90.600 cm/s LVOT VTI:          0.225 m LVOT/AV VTI ratio: 0.68  AORTA Ao Root diam: 2.80 cm Ao Asc diam:  2.90 cm MITRAL VALVE MV Area (PHT): 2.83 cm    SHUNTS MV Decel Time: 268 msec    Systemic VTI:  0.22 m  MV E velocity: 88.10 cm/s  Systemic Diam: 1.90 cm MV A velocity: 78.80 cm/s MV E/A ratio:  1.12 Mihai Croitoru MD Electronically signed by Sanda Klein MD Signature Date/Time: 03/29/2021/1:45:51 PM    Final    [place] EKG SITE RITE  Result Date: 03/28/2021 If Site Rite image not attached, placement could not be confirmed due to current cardiac rhythm.  CT CHEST ABDOMEN PELVIS WO CONTRAST  Result Date: 03/29/2021 CLINICAL DATA:  Sepsis EXAM: CT CHEST, ABDOMEN AND PELVIS WITHOUT CONTRAST TECHNIQUE: Multidetector CT imaging of the chest, abdomen and pelvis was performed following the standard protocol without IV contrast.  COMPARISON:  Chest radiograph dated 03/28/2021. CT abdomen/pelvis dated 04/26/2020. CTA chest dated 08/28/2019. FINDINGS: CT CHEST FINDINGS Cardiovascular: The heart is normal in size. No pericardial effusion. No evidence of thoracic aortic aneurysm. Mediastinum/Nodes: No suspicious mediastinal lymphadenopathy. Visualized thyroid is unremarkable. Lungs/Pleura: Endotracheal tube terminates 2.2 cm above the carina. Patchy dependent opacities in the bilateral lower lobes, favoring atelectasis. Additional mild dependent atelectasis in the bilateral upper lobes. Evaluation of the lung parenchyma is constrained by respiratory motion. Within that constraint, there are no suspicious pulmonary nodules. No focal consolidation. No pleural effusion or pneumothorax. Musculoskeletal: Degenerative changes of the thoracic spine. CT ABDOMEN PELVIS FINDINGS Hepatobiliary: Unenhanced liver is unremarkable. Status post cholecystectomy. No intrahepatic or extrahepatic ductal dilatation. Pancreas: Within normal limits. Spleen: Within normal limits. Adrenals/Urinary Tract: Adrenal glands are within normal limits. Excretory contrast in the bilateral renal collecting systems related to recent CTA. Kidneys otherwise within normal limits. No hydronephrosis. Excretory contrast in the bladder. Stomach/Bowel: Enteric tube terminates in the gastric cardia, just distal to the GE junction. No evidence of bowel obstruction. Normal appendix (series 3/image 92). No colonic wall thickening or inflammatory changes. Vascular/Lymphatic: No evidence of abdominal aortic aneurysm. No suspicious abdominopelvic lymphadenopathy. Reproductive: Uterus is within normal limits. Bilateral ovaries are within normal limits. Other: No abdominopelvic ascites. Musculoskeletal: Status post PLIF at L5-S1. Degenerative changes of the lumbar spine. IMPRESSION: Endotracheal tube terminates 2.2 cm above the carina. Enteric tube terminates in the gastric cardia, just below the  GE junction. Patchy/dependent opacities in the posterior upper and lower lobes, favoring atelectasis. No acute findings in the abdomen/pelvis. Electronically Signed   By: Julian Hy M.D.   On: 03/29/2021 02:19   CT ANGIO HEAD NECK W WO CM (CODE STROKE)  Result Date: 03/28/2021 CLINICAL DATA:  Unresponsive EXAM: CT ANGIOGRAPHY HEAD AND NECK TECHNIQUE: Multidetector CT imaging of the head and neck was performed using the standard protocol during bolus administration of intravenous contrast. Multiplanar CT image reconstructions and MIPs were obtained to evaluate the vascular anatomy. Carotid stenosis measurements (when applicable) are obtained utilizing NASCET criteria, using the distal internal carotid diameter as the denominator. CONTRAST:  52m OMNIPAQUE IOHEXOL 350 MG/ML SOLN COMPARISON:  None. FINDINGS: CTA NECK Aortic arch: Great vessel origins are patent. Right carotid system: Patent. Common carotid is partially obscured by adjacent venous artifact. No stenosis. Left carotid system: Patent. Minor plaque along the distal common carotid. No stenosis. Vertebral arteries: Patent and codominant. Skeleton: Cervical spine dictated previously. Other neck: Enteric and endotracheal tubes are present. Upper chest: No apical lung mass. Review of the MIP images confirms the above findings CTA HEAD Anterior circulation: Intracranial internal carotid arteries are patent. Anterior cerebral arteries are patent. Left A1 ACA is dominant. Anterior communicating artery is present. Middle cerebral arteries are patent. Posterior circulation: Intracranial vertebral arteries are patent. Basilar artery is patent. Major cerebellar artery origins are patent.  Bilateral posterior communicating arteries are present. There is fetal origin of the right PCA. Posterior cerebral arteries are patent. Venous sinuses: Patent as allowed by contrast bolus timing. Review of the MIP images confirms the above findings IMPRESSION: No large  vessel occlusion, hemodynamically significant stenosis, or evidence of dissection. Electronically Signed   By: Macy Mis M.D.   On: 03/28/2021 10:14    Cardiac Studies  TTE 03/29/2021  1. Left ventricular ejection fraction, by estimation, is 60 to 65%. The  left ventricle has normal function. The left ventricle has no regional  wall motion abnormalities. Left ventricular diastolic parameters were  normal.   2. Right ventricular systolic function is normal. The right ventricular  size is normal.   3. The mitral valve is normal in structure. No evidence of mitral valve  regurgitation. No evidence of mitral stenosis.   4. The aortic valve is normal in structure. Aortic valve regurgitation is  not visualized. No aortic stenosis is present.   5. The inferior vena cava is normal in size with greater than 50%  respiratory variability, suggesting right atrial pressure of 3 mmHg.   Patient Profile  Lori Bean is a 50 y.o. female with with morbid obesity (BMI 50), tobacco abuse, asthma, bipolar disorder, diabetes, HFpEF who was admitted on 03/28/2021 with acute hypercarbic respiratory failure secondary to asthma exacerbation.  Cardiology was consulted on 03/28/2021 for elevated troponin.  Assessment & Plan   Elevated troponin/demand ischemia versus non-STEMI -Admitted with critical illness in setting of hypercarbic respiratory failure.  Initial pH was 7.1.  Severely elevated lactic acid >11.0. -Cardiology was consulted for troponin value of 1735 that is trended down. -She is intubated and cannot tell us that she is having chest pain. -Her EKG is completely nonischemic. -She is hemodynamically stable. -Recent chest CT without evidence of coronary calcium. -Echocardiogram which I personally reviewed demonstrates normal LV function with no regional wall motion abnormality. -She remains critically ill in the ICU requiring mechanical ventilation.  She has remained hemodynamically stable.   She has also remained stable by telemetry. -I think this is all demand ischemia in the setting of critical illness.  Would recommend to continue aspirin. Heparin drip for 48 hours.  Avoid beta-blocker given history of reactive airway disease.  She likely can consider a coronary CTA at some point.  Her BMI is 50 but I think we can get a decent scan with high injection rate.  2.  HFpEF/morbid obesity -Appears euvolemic on examination.  CRITICAL CARE Performed by: [place] Bells T O'Neal  Total critical care time: 40 minutes. Critical care time was exclusive of separately billable procedures and treating other patients. Critical care was necessary to treat or prevent imminent or life-threatening deterioration. Critical care was time spent personally by me on the following activities: development of treatment plan with patient and/or surrogate as well as nursing, discussions with consultants, evaluation of patient's response to treatment, examination of patient, obtaining history from patient or surrogate, ordering and performing treatments and interventions, ordering and review of laboratory studies, ordering and review of radiographic studies, pulse oximetry and re-evaluation of patient's condition.  For questions or updates, please contact [place] Please consult [email or web address] for contact info under     Signed, Lake Bells T. Audie Box, MD, [place]  03/30/2021 7:53 AM

## 2021-03-30 NOTE — Procedures (Signed)
Extubation Procedure Note  Patient Details:   Name: Lori Bean DOB: Feb 04, 1971 MRN: [id number]   Airway Documentation:    Vent end date: 03/30/21 Vent end time: 1021   Evaluation  O2 sats: stable throughout Complications: No apparent complications Patient did tolerate procedure well. Bilateral Breath Sounds: Clear, Diminished   Yes  Pt extubated to 3L [place]. Positive cuff leak, no stridor noted, Spo2 97%, RN at bedside, MD aware, RT will continue to monitor   Carren Rang 03/30/2021, 10:22 AM

## 2021-03-31 DIAGNOSIS — I5032 Chronic diastolic (congestive) heart failure: Secondary | ICD-10-CM

## 2021-03-31 DIAGNOSIS — R0689 Other abnormalities of breathing: Secondary | ICD-10-CM

## 2021-03-31 DIAGNOSIS — G9341 Metabolic encephalopathy: Secondary | ICD-10-CM | POA: Diagnosis not present

## 2021-03-31 DIAGNOSIS — R778 Other specified abnormalities of plasma proteins: Secondary | ICD-10-CM | POA: Diagnosis not present

## 2021-03-31 DIAGNOSIS — J9600 Acute respiratory failure, unspecified whether with hypoxia or hypercapnia: Secondary | ICD-10-CM

## 2021-03-31 LAB — BASIC METABOLIC PANEL
Anion gap: 7 (ref 5–15)
BUN: 8 mg/dL (ref 6–20)
CO2: 23 mmol/L (ref 22–32)
Calcium: 8.7 mg/dL — ABNORMAL LOW (ref 8.9–10.3)
Chloride: 109 mmol/L (ref 98–111)
Creatinine, Ser: 0.66 mg/dL (ref 0.44–1.00)
GFR, Estimated: >60 mL/min (ref >60)
Glucose, Bld: 122 mg/dL — ABNORMAL HIGH (ref 70–99)
Potassium: 3.4 mmol/L — ABNORMAL LOW (ref 3.5–5.1)
Sodium: 139 mmol/L (ref 135–145)

## 2021-03-31 LAB — CBC
HCT: 34.8 % — ABNORMAL LOW (ref 36.0–46.0)
Hemoglobin: 11.2 g/dL — ABNORMAL LOW (ref 12.0–15.0)
MCH: 31.8 pg (ref 26.0–34.0)
MCHC: 32.2 g/dL (ref 30.0–36.0)
MCV: 98.9 fL (ref 80.0–100.0)
Platelets: 167 10*3/uL (ref 150–400)
RBC: 3.52 MIL/uL — ABNORMAL LOW (ref 3.87–5.11)
RDW: 14.2 % (ref 11.5–15.5)
WBC: 9.7 10*3/uL (ref 4.0–10.5)
nRBC: 0 % (ref 0.0–0.2)

## 2021-03-31 LAB — GLUCOSE, CAPILLARY
Glucose-Capillary: 108 mg/dL — ABNORMAL HIGH (ref 70–99)
Glucose-Capillary: 119 mg/dL — ABNORMAL HIGH (ref 70–99)
Glucose-Capillary: 111 mg/dL — ABNORMAL HIGH (ref 70–99)
Glucose-Capillary: 135 mg/dL — ABNORMAL HIGH (ref 70–99)

## 2021-03-31 LAB — HEPARIN LEVEL (UNFRACTIONATED): Heparin Unfractionated: 0.25 IU/mL — ABNORMAL LOW (ref 0.30–0.70)

## 2021-03-31 MED ORDER — POTASSIUM CHLORIDE CRYS ER 20 MEQ PO TBCR
40.0000 meq | EXTENDED_RELEASE_TABLET | Freq: Four times a day (QID) | ORAL | Status: AC
  Administered 2021-03-31 (×2): 40 meq via ORAL
  Filled 2021-03-31 (×2): qty 2

## 2021-03-31 MED ORDER — VITAMIN B-12 100 MCG PO TABS
500.0000 ug | ORAL_TABLET | Freq: Every day | ORAL | Status: DC
  Administered 2021-03-31 – 2021-04-03 (×4): 500 ug via ORAL
  Filled 2021-03-31: qty 1
  Filled 2021-03-31: qty 5
  Filled 2021-03-31: qty 1
  Filled 2021-03-31: qty 5

## 2021-03-31 MED ORDER — ENOXAPARIN SODIUM 60 MG/0.6ML IJ SOSY
60.0000 mg | PREFILLED_SYRINGE | INTRAMUSCULAR | Status: DC
  Administered 2021-03-31 – 2021-04-02 (×3): 60 mg via SUBCUTANEOUS
  Filled 2021-03-31 (×6): qty 0.6

## 2021-03-31 MED ORDER — ATORVASTATIN CALCIUM 80 MG PO TABS
80.0000 mg | ORAL_TABLET | Freq: Every day | ORAL | Status: DC
  Administered 2021-03-31 – 2021-04-03 (×4): 80 mg via ORAL
  Filled 2021-03-31 (×2): qty 2
  Filled 2021-03-31 (×2): qty 1

## 2021-03-31 MED ORDER — MONTELUKAST SODIUM 10 MG PO TABS
10.0000 mg | ORAL_TABLET | Freq: Every day | ORAL | Status: DC
  Administered 2021-03-31 – 2021-04-02 (×3): 10 mg via ORAL
  Filled 2021-03-31 (×3): qty 1

## 2021-03-31 MED ORDER — DOCUSATE SODIUM 100 MG PO CAPS: 100.0000 mg | ORAL_CAPSULE | Freq: Two times a day (BID) | ORAL | Status: DC | PRN

## 2021-03-31 MED ORDER — ASPIRIN 81 MG PO CHEW
81.0000 mg | CHEWABLE_TABLET | Freq: Every day | ORAL | Status: DC
  Administered 2021-03-31 – 2021-04-03 (×4): 81 mg via ORAL
  Filled 2021-03-31 (×4): qty 1

## 2021-03-31 MED ORDER — TRAZODONE HCL 50 MG PO TABS
50.0000 mg | ORAL_TABLET | Freq: Every day | ORAL | Status: DC
  Administered 2021-03-31 – 2021-04-02 (×3): 50 mg via ORAL
  Filled 2021-03-31 (×3): qty 1

## 2021-03-31 NOTE — Progress Notes (Signed)
NAME:  Lori Bean, MRN:  [id number], DOB:  1971-08-09, LOS: 3 ADMISSION DATE:  03/28/2021, CONSULTATION DATE:  03/28/2021 REFERRING MD:  Dr. Rush Landmark, CHIEF COMPLAINT:  AMS   History of Present Illness:  Patient is a 50 yo F w/PMH of asthma with frequent admissions this year for exacerbations, OSA (patient of Dr. Vassie Loll), smoker, anxiety, bipolar, schizophrenia, DM2, GERD, HTN, morbid obesity who presented to [facility] on 7/15 with AMS.   On 7/15, patient's significant other found patient unresponsive. Was speaking to them 10 minutes prior. EMS called, found patient unresponsive, hypotensive, and in respiratory distress. Breathing assisted with BVM. Administered Narcan.   7/15 ED course: patient was hypertensive, agitated, and still being assisted with BVM. Intubated and sedation started. BP improved post intubation. CT head negative. Neuro consulted and ordered MRI. UDS, UA pending. Bcx2 pending. ABG 7.16, 70, 382, 25.7. Increased RR on mech vent to 28. Glucose 341, co2 16, creat 1.21, anion gap 19. Elevated Lfts, Ammonia 189. Lactic acid >11. WBC 26. Afebrile. Troponin 44.   PCCM consulted for admission and medical management.  Significant Hospital Events: Including procedures, antibiotic start and stop dates in addition to other pertinent events   7/15: admitted to [facility] for acute metabolic encephalopahty; intubated; CT head neg 7/16: more alert. MRI brain neg. Weaning sedation and PS as tolerated 7/17: extubated 7/18: stable for transfer to floow  Interim History / Subjective:  Patient extubated yesterday. Doing well on 2L [place] this morning. Reports slight abdominal discomfort but otherwise no complaints.  Objective   Blood pressure (!) 149/82, pulse 80, temperature 98.5 F (36.9 C), temperature source Oral, resp. rate 15, height 5\' 2"  (1.575 m), weight 125.5 kg, last menstrual period 10/15/2018, SpO2 96 %.    FiO2 (%):  [28 %] 28 %   Intake/Output Summary (Last 24 hours) at 03/31/2021  0916 Last data filed at 03/31/2021 0800 Gross per 24 hour  Intake 900.32 ml  Output 1175 ml  Net -274.68 ml   Filed Weights   03/28/21 0722 03/30/21 0500 03/31/21 0415  Weight: (!) 173.7 kg 125.5 kg 125.5 kg    Examination: General: alert, obese, no acute distress HENT: [place], AT Lungs: normal WOB on 2L, lungs CTAB Cardiovascular: RRR, no m/r/g Abdomen: +BS, soft, nontender Extremities: no appreciable edema Neuro: grossly intact, follows commands, equal strength in all extremities  Resolved Hospital Problem list   Acute metabolic encephalopathy secondary to hypercarbic and hypoxemic respiratory failure  Assessment & Plan:  Acute Hypercapnic and Hypoxemic Respiratory Failure s/p intubated 7/15-7/17 Hx of asthma, OSA Smoker Stable on 2L [place] this morning. Etiology remains unclear- possible asthma exacerbation although presentation very atypical- no wheezing, very acute onset of symptoms, no cough. -Continue Ceftriaxone (day 4 of 7) -Continue scheduled pulmicort and duonebs -Singulair -Nicotine patch -Holding home Chantix, Breztri for now -Stable for transfer to floor  Acute Metabolic Encephalopathy Resolved. Secondary to hypercarbia. MRI brain normal; CT head negative  Elevated Troponin Peaked at 1735 on 7/15, downtrended to 576 on 7/16. Echo wnl. P: -Cardiology following, appreciate input -d/c Heparin gtt (s/p 48 hrs) -ASA, statin  T2DM Hyperglycemia A1c 7.2 -SSI and CBG monitoring  Chronic HTN BP mildly elevated to 150s systolic over past 24h. -Have been holding home meds -Restart home Valsartan today  Hx of Bipolar, Schizophrenia -Consider restarting home Abilify, Duloxetine, Topiramate  Best Practice (right click and "Reselect all SmartList Selections" daily)   Diet/type: heart healthy DVT prophylaxis: Lovenox Lines: Central line Code Status:  full code  Labs   CBC: Recent Labs  Lab 03/28/21 0653 03/28/21 0655 03/28/21 2315 03/29/21 0523  03/29/21 1015 03/30/21 0349 03/31/21 0407  WBC 26.2*  --  9.9  --   --  11.9* 9.7  NEUTROABS 10.5*  --   --   --   --   --   --   HGB 13.9   < > 13.4 11.6* 12.9 11.6* 11.2*  HCT 45.7   < > 40.6 34.0* 38.0 35.8* 34.8*  MCV 105.5*  --  99.5  --   --  100.6* 98.9  PLT 250  --  188  --   --  147* 167   < > = values in this interval not displayed.    Basic Metabolic Panel: Recent Labs  Lab 03/28/21 0653 03/28/21 0655 03/28/21 0829 03/28/21 2315 03/29/21 0523 03/29/21 1015 03/30/21 0349 03/31/21 0407  NA 136 135   < > 140 140 141 138 139  K 5.1 5.0   < > 3.5 3.3* 3.3* 3.9 3.4*  CL 101 105  --  105  --   --  110 109  CO2 16*  --   --  26  --   --  23 23  GLUCOSE 341* 335*  --  131*  --   --  184* 122*  BUN 13 15  --  13  --   --  7 8  CREATININE 1.21* 1.10*  --  1.00  --   --  0.64 0.66  CALCIUM 9.1  --   --  9.0  --   --  8.6* 8.7*  MG  --   --   --  2.1  --   --   --   --    < > = values in this interval not displayed.   GFR: Estimated Creatinine Clearance: 105.5 mL/min (by C-G formula based on SCr of 0.66 mg/dL). Recent Labs  Lab 03/28/21 0653 03/28/21 2315 03/30/21 0349 03/31/21 0407  WBC 26.2* 9.9 11.9* 9.7  LATICACIDVEN >11.0* 1.4  --   --     Liver Function Tests: Recent Labs  Lab 03/28/21 0653 03/28/21 2315  AST 102* 99*  ALT 96* 157*  ALKPHOS 80 71  BILITOT 0.5 0.6  PROT 6.3* 6.4*  ALBUMIN 3.2* 3.1*   No results for input(s): LIPASE, AMYLASE in the last 168 hours. Recent Labs  Lab 03/28/21 0653 03/28/21 2315  AMMONIA 189* 30    ABG    Component Value Date/Time   PHART 7.306 (L) 03/29/2021 1015   PCO2ART 46.9 03/29/2021 1015   PO2ART 72 (L) 03/29/2021 1015   HCO3 23.3 03/29/2021 1015   TCO2 25 03/29/2021 1015   ACIDBASEDEF 3.0 (H) 03/29/2021 1015   O2SAT 92.0 03/29/2021 1015     Coagulation Profile: Recent Labs  Lab 03/28/21 2315  INR 1.0    Cardiac Enzymes: No results for input(s): CKTOTAL, CKMB, CKMBINDEX, TROPONINI in the last  168 hours.  HbA1C: Hgb A1c MFr Bld  Date/Time Value Ref Range Status  03/28/2021 11:15 PM 7.2 (H) 4.8 - 5.6 % Final    Comment:    (NOTE) Pre diabetes:          5.7%-6.4%  Diabetes:              >6.4%  Glycemic control for   <7.0% adults with diabetes   12/16/2020 12:50 AM 6.6 (H) 4.8 - 5.6 % Final    Comment:    (NOTE) Pre diabetes:  5.7%-6.4%  Diabetes:              >6.4%  Glycemic control for   <7.0% adults with diabetes     CBG: Recent Labs  Lab 03/30/21 0709 03/30/21 1121 03/30/21 1716 03/30/21 2120 03/31/21 0740  GLUCAP 167* 125* 87 117* 108*    Past Medical History:  She,  has a past medical history of Abdominal hernia, Anginal pain ([place]), Anxiety, Arthritis, Asthma, Bipolar affective disorder ([place]), Bronchitis, CHF (congestive heart failure) ([place]), Depression, Diabetes mellitus without complication ([place]), GERD (gastroesophageal reflux disease), Heart murmur, Hypertension, Lymphedema (06/07/2018), Morbid (severe) obesity due to excess calories ([place]), Neuropathy, Overactive bladder, Schizophrenia ([place]), Sciatica, Shortness of breath, Sleep apnea, and Snoring disorder.   Surgical History:   Past Surgical History:  Procedure Laterality Date   BACK SURGERY     3 back surgeries   CHOLECYSTECTOMY     COLONOSCOPY WITH PROPOFOL N/A 01/23/2016   Procedure: COLONOSCOPY WITH PROPOFOL;  Surgeon: Wonda Horner, MD;  Location: [facility] ENDOSCOPY;  Service: Endoscopy;  Laterality: N/A;   EYE SURGERY     Metal plate in right eye. Had fracture in right eye   gallstones reomved     KNEE ARTHROSCOPY WITH MENISCAL REPAIR Right 09/28/2016   Procedure: KNEE ARTHROSCOPY WITH MENISCAL REPAIR;  Surgeon: Dorna Leitz, MD;  Location: [facility];  Service: Orthopedics;  Laterality: Right;  Right partial meniscectomy and chondroplasty, patellar/femoral joint and medial femoral condyle    LUMBAR LAMINECTOMY/DECOMPRESSION MICRODISCECTOMY  04/01/2012   Procedure: LUMBAR  LAMINECTOMY/DECOMPRESSION MICRODISCECTOMY 2 LEVELS;  Surgeon: Faythe Ghee, MD;  Location: [facility] NEURO ORS;  Service: Neurosurgery;  Laterality: Left;  Lumbar four-five, lumbar five sacral one microdiscectomy    LUMBAR WOUND DEBRIDEMENT  04/29/2012   Procedure: LUMBAR WOUND DEBRIDEMENT;  Surgeon: Faythe Ghee, MD;  Location: [place] [facility]URO ORS;  Service: Neurosurgery;  Laterality: N/A;  lumbar wound debridement   ROTATOR CUFF REPAIR     Right shoulder   TUBAL LIGATION       Social History:   reports that she quit smoking about 3 months ago. Her smoking use included cigarettes. She has a 24.00 pack-year smoking history. She has never used smokeless tobacco. She reports previous drug use. Drug: Cocaine. She reports that she does not drink alcohol.   Family History:  Her family history includes Cancer in her paternal aunt; Diabetes in her father and mother; Heart disease in her paternal aunt; Hypertension in her mother. There is no history of Esophageal cancer or Stomach cancer.   Allergies Allergies  Allergen Reactions   Aspirin Nausea And Vomiting     Home Medications  Prior to Admission medications   Medication Sig Start Date End Date Taking? Authorizing Provider  ACCU-CHEK GUIDE test strip See admin instructions. 09/20/20   [provider]  albuterol (PROVENTIL) (2.5 MG/3ML) 0.083% nebulizer solution Take 2.5 mg by nebulization every 4 (four) hours as needed for wheezing or shortness of breath.    [provider]  albuterol (VENTOLIN HFA) 108 (90 Base) MCG/ACT inhaler Inhale 2 puffs into the lungs every 4 (four) hours as needed for wheezing or shortness of breath.    [provider]  ARIPiprazole (ABILIFY) 20 MG tablet Take 1 tablet (20 mg total) by mouth at bedtime. 03/04/15   Lance Bosch, NP  blood glucose meter kit and supplies KIT Dispense based on patient and insurance preference. Use up to four times daily as directed. (FOR ICD-9 250.00, 250.01). 08/31/19  Nita Sells, MD  Budeson-Glycopyrrol-Formoterol (BREZTRI AEROSPHERE) 160-9-4.8 MCG/ACT AERO Inhale 2 puffs into the lungs in the morning and at bedtime. 12/18/20   Rigoberto Noel, MD  Dulaglutide (TRULICITY) 3.95 VU/0.2BX SOPN Inject 0.75 mg into the skin every Sunday.    [provider]  DULoxetine (CYMBALTA) 30 MG capsule Take 30 mg by mouth 2 (two) times daily. 11/18/20   [provider]  fesoterodine (TOVIAZ) 4 MG TB24 tablet Take 4 mg by mouth daily.    [provider]  gabapentin (NEURONTIN) 300 MG capsule Take 300 mg by mouth 3 (three) times daily.  02/02/19   [provider]  glipiZIDE (GLUCOTROL XL) 10 MG 24 hr tablet Take 10 mg by mouth daily with breakfast.     [provider]  Glycopyrrolate-Formoterol (BEVESPI AEROSPHERE) 9-4.8 MCG/ACT AERO Inhale 2 puffs into the lungs 2 (two) times daily.    [provider]  hydrocortisone 2.5 % cream Apply topically 3 (three) times daily. 03/06/21   [provider]  hydrOXYzine (ATARAX/VISTARIL) 25 MG tablet Take 25 mg by mouth 2 (two) times daily. 01/01/21   [provider]  metFORMIN (GLUCOPHAGE) 1000 MG tablet Take 1,000 mg by mouth 2 (two) times daily with a meal.     [provider]  metoprolol succinate (TOPROL-XL) 25 MG 24 hr tablet Take 25 mg by mouth daily. 11/15/20   [provider]  montelukast (SINGULAIR) 10 MG tablet Take 1 tablet (10 mg total) by mouth at bedtime. 09/20/19   Martyn Ehrich, NP  omeprazole (PRILOSEC) 40 MG capsule Take 1 capsule (40 mg total) by mouth daily. 12/14/17   [name], MD  oxybutynin (DITROPAN-XL) 10 MG 24 hr tablet Take 10 mg by mouth daily. 02/28/21   [provider]  Oxycodone HCl 10 MG TABS Take 10 mg by mouth every 6 (six) hours. 06/12/20   [provider]  phentermine (ADIPEX-P) 37.5 MG tablet Take 37.5 mg by mouth daily. 10/23/20   [provider]  Respiratory Therapy Supplies  (FLUTTER) DEVI Use flutter device 3 times a day 07/20/18   Martyn Ehrich, NP  solifenacin (VESICARE) 5 MG tablet Take 5 mg by mouth daily. 02/28/21   [provider]  topiramate (TOPAMAX) 50 MG tablet Take 50 mg by mouth daily.    [provider]  torsemide (DEMADEX) 20 MG tablet Take 2 tablets (40 mg total) by mouth 2 (two) times daily. 07/17/20   Erlene Quan, PA-C  traZODone (DESYREL) 50 MG tablet Take 50 mg by mouth at bedtime. 11/15/20   [provider]  Triamcinolone Acetonide 0.025 % LOTN Apply 1 application topically 3 (three) times daily as needed (to affected Areas).     [provider]  triamterene-hydrochlorothiazide (MAXZIDE-25) 37.5-25 MG tablet Take 1 tablet by mouth daily. 12/25/20   [provider]  valsartan (DIOVAN) 80 MG tablet Take 80 mg by mouth daily.     [provider]  varenicline (CHANTIX) 0.5 MG tablet Take 1 tablet (0.5 mg total) by mouth daily for 3 days, THEN 1 tablet (0.5 mg total) 2 (two) times daily for 4 days, THEN 2 tablets (1 mg total) 2 (two) times daily for 23 days. 03/14/21 04/13/21  Scarlett Presto, MD  Vitamin D, Ergocalciferol, (DRISDOL) 1.25 MG (50000 UNIT) CAPS capsule Take 50,000 Units by mouth every Thursday. 10/23/20   [provider]      Alcus Dad, MD PGY-2 [place]

## 2021-03-31 NOTE — Evaluation (Signed)
Occupational Therapy Evaluation Patient Details Name: Lori Bean MRN: [id number] DOB: 05-15-1971 Today's Date: 03/31/2021    History of Present Illness 50 y/o female admitted 7/15 with acute metabolic encephalopathy following an asthma exacerbation. Intubated 7/15-7/17 PMH: OSA, asthma, diastolic HF, A1PF, anxiety, bipolar disorder, and schizophrenia.   Clinical Impression   PTA patient was living with a female friend in a private residence and was grossly Mod I with ADLs with use of AD. Patient reports bathing at shower level seated on shower chair without external assist and driving. Has HH aide M-F for 2.5hrs/day to assist with cooking/cleaning/laundry. Patient currently limited by decreased cardiopulmonary endurance, decreased activity tolerance, generalized weakness and decreased dynamic balance and would benefit from continued acute OT services in prep for safe d/c home.     Follow Up Recommendations  Home health OT    Equipment Recommendations  None recommended by OT;Other (comment) (Patient has necessary DME.)    Recommendations for Other Services       Precautions / Restrictions Precautions Precautions: Fall Restrictions Weight Bearing Restrictions: No      Mobility Bed Mobility Overal bed mobility: Needs Assistance Bed Mobility: Supine to Sit     Supine to sit: Min assist;HOB elevated     General bed mobility comments: Patient seated in recliner upon entry.    Transfers Overall transfer level: Needs assistance Equipment used: Rolling walker (2 wheeled) Transfers: Sit to/from Stand Sit to Stand: Min guard         General transfer comment: Min guard from low recliner with increased time/effort.    Balance Overall balance assessment: Needs assistance Sitting-balance support: Feet supported;No upper extremity supported Sitting balance-Leahy Scale: Good Sitting balance - Comments: Pt maintained upright posture in seated position with feet  supported   Standing balance support: Single extremity supported;Bilateral upper extremity supported;During functional activity Standing balance-Leahy Scale: Fair Standing balance comment: Able to maintain static standing balance without UE support.                           ADL either performed or assessed with clinical judgement   ADL Overall ADL's : Needs assistance/impaired                     Lower Body Dressing: Maximal assistance Lower Body Dressing Details (indicate cue type and reason): Max A to doff/don footwear in sitting. Patient unable to reach her feet 2/2 body habitus and BLE swelling.               General ADL Comments: Patient limited by generalized weakness, decreased cardiopulmonary endurance and decreaed activity tolerance.     Vision Baseline Vision/History: Wears glasses Wears Glasses: Reading only Patient Visual Report: No change from baseline;Other (comment) (Reports blurry vision up close without reading glasses.) Vision Assessment?: No apparent visual deficits     Perception     Praxis      Pertinent Vitals/Pain Pain Assessment: Faces Pain Score: 10-Worst pain ever Faces Pain Scale: Hurts a little bit Pain Location: Bilateral thighs Pain Descriptors / Indicators: Aching;Sore Pain Intervention(s): Limited activity within patient's tolerance;Monitored during session     Hand Dominance Right   Extremity/Trunk Assessment Upper Extremity Assessment Upper Extremity Assessment: Generalized weakness   Lower Extremity Assessment Lower Extremity Assessment: Defer to PT evaluation   Cervical / Trunk Assessment Cervical / Trunk Assessment: Other exceptions Cervical / Trunk Exceptions: Large body habitus   Communication Communication Communication: No difficulties  Cognition Arousal/Alertness: Awake/alert Behavior During Therapy: WFL for tasks assessed/performed Overall Cognitive Status: Within Functional Limits for tasks  assessed                                     General Comments  Swelling present in all 4 extremities. SpO2 >90% on 2L via [place] with mild activity.    Exercises     Shoulder Instructions      Home Living Family/patient expects to be discharged to:: Private residence Living Arrangements: Other relatives Available Help at Discharge: Family Type of Home: House Home Access: Stairs to enter [employer] of Steps: 2 Entrance Stairs-Rails: None Home Layout: One level Alternate Level Stairs-Number of Steps: One step to reach the bedroom   Bathroom Shower/Tub: Occupational psychologist: Standard     Home Equipment: Environmental consultant - 4 wheels;Cane - single point;Shower seat          Prior Functioning/Environment Level of Independence: Needs assistance  Gait / Transfers Assistance Needed: Reports use of SPC in home and community dwellings ADL's / Homemaking Assistance Needed: [place] aide M-F for 2.5hrs to assist with IADLs inclduing cooking/cleaning            OT Problem List: Decreased strength;Decreased activity tolerance;Impaired balance (sitting and/or standing);Decreased knowledge of use of DME or AE;Increased edema      OT Treatment/Interventions: Self-care/ADL training;Therapeutic exercise;Energy conservation;DME and/or AE instruction;Therapeutic activities;Patient/family education;Balance training    OT Goals(Current goals can be found in the care plan section) Acute Rehab OT Goals Patient Stated Goal: To return home. OT Goal Formulation: With patient Time For Goal Achievement: 04/14/21 Potential to Achieve Goals: Good ADL Goals Pt Will Perform Grooming: with modified independence;standing Pt Will Perform Upper Body Dressing: with modified independence Pt Will Perform Lower Body Dressing: with modified independence;with adaptive equipment;sitting/lateral leans;sit to/from stand Pt Will Transfer to Toilet: with modified independence;ambulating Pt  Will Perform Toileting - Clothing Manipulation and hygiene: with modified independence;sit to/from stand;sitting/lateral leans Additional ADL Goal #1: Patient will tolerate 10-15 minutes of therapeutic activity without need for rest break indicating increased activity tolerance.  OT Frequency: Min 2X/week   Barriers to D/C:            Co-evaluation              AM-PAC OT "6 Clicks" Daily Activity     Outcome Measure Help from another person eating meals?: None Help from another person taking care of personal grooming?: A Little Help from another person toileting, which includes using toliet, bedpan, or urinal?: A Lot Help from another person bathing (including washing, rinsing, drying)?: A Lot Help from another person to put on and taking off regular upper body clothing?: A Little Help from another person to put on and taking off regular lower body clothing?: A Lot 6 Click Score: 16   End of Session Equipment Utilized During Treatment: Oxygen Nurse Communication: Mobility status;Other (comment) (Response to treatment)  Activity Tolerance: Patient tolerated treatment well Patient left: in chair;with call bell/phone within reach;with chair alarm set  OT Visit Diagnosis: Unsteadiness on feet (R26.81);Muscle weakness (generalized) (M62.81)                Time: 6962-9528 OT Time Calculation (min): 28 min Charges:  OT General Charges $OT Visit: 1 Visit OT Evaluation $OT Eval Low Complexity: 1 Low OT Treatments $Therapeutic Activity: 8-22 mins  Elspeth Blucher H. OTR/L Supplemental OT,  Department of rehab services [phone]  Haydyn Liddell R H. 03/31/2021, 1:02 PM

## 2021-03-31 NOTE — Progress Notes (Signed)
ANTICOAGULATION CONSULT NOTE   Pharmacy Consult for Heparin Indication: chest pain/ACS  Allergies  Allergen Reactions   Aspirin Nausea And Vomiting    Patient Measurements: Height: 5\' 2"  (157.5 cm) Weight: 125.5 kg (276 lb 10.8 oz) IBW/kg (Calculated) : 50.1 Heparin Dosing Weight: 95 kg  Vital Signs: Temp: 97.8 F (36.6 C) (07/18 0322) Temp Source: Oral (07/18 0322) BP: 147/72 (07/18 0400) Pulse Rate: 86 (07/18 0400)  Labs: Recent Labs    03/28/21 0653 03/28/21 0655 03/28/21 0829 03/28/21 2315 03/29/21 0523 03/29/21 0608 03/29/21 1015 03/29/21 1021 03/29/21 1021 03/30/21 0349 03/30/21 1206 03/30/21 1953 03/31/21 0407  HGB 13.9 15.0   < > 13.4 11.6*  --  12.9  --   --  11.6*  --   --   --   HCT 45.7 44.0   < > 40.6 34.0*  --  38.0  --   --  35.8*  --   --   --   PLT 250  --   --  188  --   --   --   --   --  147*  --   --   --   LABPROT  --   --   --  12.7  --   --   --   --   --   --   --   --   --   INR  --   --   --  1.0  --   --   --   --   --   --   --   --   --   HEPARINUNFRC  --   --   --   --   --   --   --  0.32   < > 0.12* 0.24* 0.26* 0.25*  CREATININE 1.21* 1.10*  --  1.00  --   --   --   --   --  0.64  --   --   --   TROPONINIHS 44*  --   --  1,735*  --  913*  --  576*  --   --   --   --   --    < > = values in this interval not displayed.     Estimated Creatinine Clearance: 105.5 mL/min (by C-G formula based on SCr of 0.64 mg/dL).  Assessment: 50 y.o. F presented unresponsive. Pt now with elevated trop to 1735 and to begin heparin per pharmacy. No AC PTA as best we can tell. Pt was on SQ heparin for VTE prophylaxis - last dose given 7/15 2100.   Heparin level remains subtherapeutic (0.25) on 1800 units/hr. No issues with line or bleeding reported per RN.  Goal of Therapy:  Heparin level 0.3-0.7 units/ml Monitor platelets by anticoagulation protocol: Yes   Plan:  Increase heparin to 2000 units/hr Heparin level in 6 hours  Sherlon Handing,  PharmD, BCPS Please see amion for complete clinical pharmacist phone list 03/31/2021 5:21 AM

## 2021-03-31 NOTE — Progress Notes (Signed)
eLink Physician-Brief Progress Note Patient Name: RUBEN PYKA DOB: 02/09/1971 MRN: [id number]   Date of Service  03/31/2021  HPI/Events of Note  Patient request for home Trazodone for sleep.  eICU Interventions  Plan: Trazodone 50 mg PO Q HS.     Intervention Category Major Interventions: Other:  Lysle Dingwall 03/31/2021, 9:29 PM

## 2021-03-31 NOTE — Evaluation (Signed)
Physical Therapy Evaluation Patient Details Name: Lori Bean MRN: [id number] DOB: 05/22/1971 Today's Date: 03/31/2021   History of Present Illness  50 y/o female admitted 7/15 with acute metabolic encephalopathy following an asthma exacerbation. Intubated 7/15-7/17 PMH: OSA, asthma, diastolic HF, M7EH, anxiety, bipolar disorder, and schizophrenia.  Clinical Impression  Pt awake/alert during session and responded accurately to commands. Pt required min A +1 physical assistance with bed mobility tasks and sit to stand transfer. Required verbal cues for hand placement prior to standing. Pt reported feeling dizzy while seated EOB. Vitals were stable EOB. Pt desat to 85% after ambulating 42ft. Increased O2 from 2-3LPM. After ambulating 50ft O2 sat decreased to 82% and O2 was increased to 4LPM to maintain sats >90%. Pt ambulated 20ft, was SOB, and required a seated rest break. Recommend pt continues with PT to increase endurance, activity tolerance, and ambulation distance. Recommend pt for [facility], 24hr supervision upon discharge.    Follow Up Recommendations Supervision/Assistance - 24 hour;Home health PT    Equipment Recommendations  None recommended by PT    Recommendations for Other Services       Precautions / Restrictions Precautions Precautions: Fall Restrictions Weight Bearing Restrictions: No      Mobility  Bed Mobility Overal bed mobility: Needs Assistance Bed Mobility: Supine to Sit     Supine to sit: Min assist;HOB elevated     General bed mobility comments: Patient seated in recliner upon entry.    Transfers Overall transfer level: Needs assistance Equipment used: Rolling walker (2 wheeled) Transfers: Sit to/from Stand Sit to Stand: Min guard         General transfer comment: Min guard from low recliner with increased time/effort.  Ambulation/Gait Ambulation/Gait assistance: Min guard Gait Distance (Feet): 60 Feet Assistive device: Rolling walker (2  wheeled) Gait Pattern/deviations: Step-to pattern;Trunk flexed   Gait velocity interpretation: <1.31 ft/sec, indicative of household ambulator General Gait Details: Pt demonstrated a step-to pattern with increased trunk flexion when ambulating. Pt required verbal cues to maintain upright posture. Increased O2 from 2-3LPM after ambulating approximately 46ft, spO2 decreased to 85%. When approaching 70ft, sats decreased to 82%, therefore, increased O2 to 4LPM to maintain sats > 90%. Pt required seated rest after ambulating 89ft and was transported back to room via recliner.  Stairs            Wheelchair Mobility    Modified Rankin (Stroke Patients Only)       Balance Overall balance assessment: Needs assistance Sitting-balance support: Feet supported;No upper extremity supported Sitting balance-Leahy Scale: Good Sitting balance - Comments: Pt maintained upright posture in seated position with feet supported   Standing balance support: Single extremity supported;Bilateral upper extremity supported;During functional activity Standing balance-Leahy Scale: Fair Standing balance comment: Able to maintain static standing balance without UE support.                             Pertinent Vitals/Pain Pain Assessment: Faces Pain Score: 10-Worst pain ever Faces Pain Scale: Hurts a little bit Pain Location: Bilateral thighs Pain Descriptors / Indicators: Aching;Sore Pain Intervention(s): Limited activity within patient's tolerance;Monitored during session    [place] expects to be discharged to:: Private residence Living Arrangements: Other relatives Available Help at Discharge: Family Type of Home: House Home Access: Stairs to enter Entrance Stairs-Rails: None Entrance Stairs-Number of Steps: 2 Home Layout: One level Home Equipment: Environmental consultant - 4 wheels;Cane - single point;Shower seat  Prior Function Level of Independence: Needs assistance   Gait /  Transfers Assistance Needed: Reports use of SPC in home and community dwellings  ADL's / Homemaking Assistance Needed: [employer] aide M-F for 2.5hrs to assist with IADLs inclduing cooking/cleaning        Hand Dominance   Dominant Hand: Right    Extremity/Trunk Assessment   Upper Extremity Assessment Upper Extremity Assessment: Generalized weakness    Lower Extremity Assessment Lower Extremity Assessment: Defer to PT evaluation    Cervical / Trunk Assessment Cervical / Trunk Assessment: Other exceptions Cervical / Trunk Exceptions: Large body habitus  Communication   Communication: No difficulties  Cognition Arousal/Alertness: Awake/alert Behavior During Therapy: WFL for tasks assessed/performed Overall Cognitive Status: Within Functional Limits for tasks assessed                                        General Comments General comments (skin integrity, edema, etc.): Swelling present in all 4 extremities. SpO2 >90% on 2L via [place] with mild activity.    Exercises     Assessment/Plan    PT Assessment Patient needs continued PT services  PT Problem List Decreased strength;Decreased mobility;Decreased activity tolerance       PT Treatment Interventions Gait training;Stair training;Therapeutic activities;Therapeutic exercise;Functional mobility training    PT Goals (Current goals can be found in the Care Plan section)  Acute Rehab PT Goals Patient Stated Goal: To return home. PT Goal Formulation: With patient Time For Goal Achievement: 04/15/21 Potential to Achieve Goals: Good    Frequency Min 3X/week   Barriers to discharge        Co-evaluation               AM-PAC PT "6 Clicks" Mobility  Outcome Measure Help needed turning from your back to your side while in a flat bed without using bedrails?: A Little Help needed moving from lying on your back to sitting on the side of a flat bed without using bedrails?: A Little Help needed moving to and  from a bed to a chair (including a wheelchair)?: A Little Help needed standing up from a chair using your arms (e.g., wheelchair or bedside chair)?: A Little Help needed to walk in hospital room?: A Little Help needed climbing 3-5 steps with a railing? : A Lot 6 Click Score: 17    End of Session Equipment Utilized During Treatment: Gait belt;Oxygen Activity Tolerance: Patient tolerated treatment well Patient left: in chair;with call bell/phone within reach;with chair alarm set Nurse Communication: Mobility status PT Visit Diagnosis: Other abnormalities of gait and mobility (R26.89);Muscle weakness (generalized) (M62.81);Unsteadiness on feet (R26.81)    Time: 2979-8921 PT Time Calculation (min) (ACUTE ONLY): 34 min   Charges:   PT Evaluation $PT Eval Moderate Complexity: 1 Mod PT Treatments $Gait Training: 8-22 mins        Louie Casa, SPT Acute Rehab: [phone]   Domingo Dimes 03/31/2021, 2:53 PM

## 2021-03-31 NOTE — Progress Notes (Signed)
Nutrition Follow-up  DOCUMENTATION CODES:   Morbid obesity  INTERVENTION:   Add HS snack daily. Nutritional services ambassador to help patient with meal options.   NUTRITION DIAGNOSIS:   Inadequate oral intake related to inability to eat as evidenced by NPO status.  GOAL:   Provide needs based on ASPEN/SCCM guidelines  MONITOR:   Vent status, TF tolerance, Labs  REASON FOR ASSESSMENT:   Ventilator    ASSESSMENT:   50 yo female admitted with AMS requiring intubation in the ED. PMH includes asthma, OSA, smoker, anxiety, bipolar disorder, schizophrenia, DM2, GERD, HTN, morbid obesity.  Discussed patient in ICU rounds and with RN today. Patient was extubated on 7/17. Currently on 2 L oxygen via nasal cannula.   Diet has been advanced to heart healthy carbohydrate modified. Patient disliked her breakfast this morning. She only ate 25%. She requests chicken salad. She does not follow a diet at home. Provided menu to patient so she can choose her own meals.    Labs reviewed. K 3.4 CBG: 108 this morning  Medications reviewed and include novolog, klor-con, vitamin B-12 tablet.   Diet Order:   Diet Order             Diet heart healthy/carb modified Room service appropriate? Yes; Fluid consistency: Thin  Diet effective now                   EDUCATION NEEDS:   Not appropriate for education at this time  Skin:  Skin Assessment: Reviewed RN Assessment  Last BM:  7/18 type 7  Height:   Ht Readings from Last 1 Encounters:  03/28/21 5\' 2"  (1.575 m)    Weight:   Wt Readings from Last 1 Encounters:  03/31/21 125.5 kg    Ideal Body Weight:  50 kg  BMI:  Body mass index is 50.6 kg/m.  Estimated Nutritional Needs:   Kcal:  1900-2100  Protein:  100-120 gm  Fluid:  1.9-2.1 L    Lucas Mallow, RD, LDN, CNSC Please refer to Amion for contact information.

## 2021-03-31 NOTE — Progress Notes (Signed)
Progress Note  Patient Name: Lori Bean Date of Encounter: 03/31/2021  [facility] Cardiologist: [name], MD   Subjective   Feeling much better.  Still a little short of breath. Unsure if she can lay flat.   Inpatient Medications    Scheduled Meds:  aspirin  81 mg Oral Daily   atorvastatin  80 mg Oral Daily   budesonide (PULMICORT) nebulizer solution  0.25 mg Nebulization BID   Chlorhexidine Gluconate Cloth  6 each Topical Daily   enoxaparin (LOVENOX) injection  60 mg Subcutaneous Q24H   insulin aspart  0-15 Units Subcutaneous TID AC & HS   ipratropium-albuterol  3 mL Nebulization BID   mouth rinse  15 mL Mouth Rinse BID   montelukast  10 mg Oral QHS   nicotine  7 mg Transdermal Daily   potassium chloride  40 mEq Oral Q6H   vitamin B-12  500 mcg Oral Daily   Continuous Infusions:  sodium chloride 10 mL/hr at 03/31/21 0600   cefTRIAXone (ROCEPHIN)  IV Stopped (03/30/21 1431)   PRN Meds: sodium chloride, acetaminophen, docusate sodium, hydrALAZINE   Vital Signs    Vitals:   03/31/21 0415 03/31/21 0500 03/31/21 0600 03/31/21 0743  BP:  (!) 144/74 (!) 149/82   Pulse:  84 80   Resp:  (!) 21 15   Temp:    98.5 F (36.9 C)  TempSrc:    Oral  SpO2:  93% 96%   Weight: 125.5 kg     Height:        Intake/Output Summary (Last 24 hours) at 03/31/2021 1028 Last data filed at 03/31/2021 0800 Gross per 24 hour  Intake 870.71 ml  Output 1025 ml  Net -154.29 ml   Last 3 Weights 03/31/2021 03/30/2021 03/28/2021  Weight (lbs) 276 lb 10.8 oz 276 lb 10.8 oz 382 lb 15 oz  Weight (kg) 125.5 kg 125.5 kg 173.7 kg      Telemetry    Sinus rhythm.  - Personally Reviewed  ECG   N/a - Personally Reviewed  Physical Exam   GEN: No acute distress.   Neck: No JVD Cardiac: RRR, no murmurs, rubs, or gallops.  Respiratory: Clear to auscultation bilaterally. GI: Soft, nontender, non-distended  MS: No edema; No deformity. Neuro:  Nonfocal  Psych: Normal  affect   Labs    High Sensitivity Troponin:   Recent Labs  Lab 03/12/21 1822 03/28/21 0653 03/28/21 2315 03/29/21 0608 03/29/21 1021  TROPONINIHS 4 44* 1,735* 913* 576*      Chemistry Recent Labs  Lab 03/28/21 0653 03/28/21 0655 03/28/21 2315 03/29/21 0523 03/29/21 1015 03/30/21 0349 03/31/21 0407  NA 136   < > 140   < > 141 138 139  K 5.1   < > 3.5   < > 3.3* 3.9 3.4*  CL 101   < > 105  --   --  110 109  CO2 16*  --  26  --   --  23 23  GLUCOSE 341*   < > 131*  --   --  184* 122*  BUN 13   < > 13  --   --  7 8  CREATININE 1.21*   < > 1.00  --   --  0.64 0.66  CALCIUM 9.1  --  9.0  --   --  8.6* 8.7*  PROT 6.3*  --  6.4*  --   --   --   --   ALBUMIN 3.2*  --  3.1*  --   --   --   --   AST 102*  --  99*  --   --   --   --   ALT 96*  --  157*  --   --   --   --   ALKPHOS 80  --  71  --   --   --   --   BILITOT 0.5  --  0.6  --   --   --   --   GFRNONAA 54*  --  >60  --   --  >60 >60  ANIONGAP 19*  --  9  --   --  5 7   < > = values in this interval not displayed.     Hematology Recent Labs  Lab 03/28/21 2315 03/29/21 0523 03/29/21 1015 03/30/21 0349 03/31/21 0407  WBC 9.9  --   --  11.9* 9.7  RBC 4.08  --   --  3.56* 3.52*  HGB 13.4   < > 12.9 11.6* 11.2*  HCT 40.6   < > 38.0 35.8* 34.8*  MCV 99.5  --   --  100.6* 98.9  MCH 32.8  --   --  32.6 31.8  MCHC 33.0  --   --  32.4 32.2  RDW 14.1  --   --  14.0 14.2  PLT 188  --   --  147* 167   < > = values in this interval not displayed.    BNP Recent Labs  Lab 03/28/21 0653  BNP 43.4     DDimer No results for input(s): DDIMER in the last 168 hours.   Radiology    MR BRAIN W WO CONTRAST  Result Date: 03/29/2021 CLINICAL DATA:  Seizure nontraumatic. EXAM: MRI HEAD WITHOUT AND WITH CONTRAST TECHNIQUE: Multiplanar, multiecho pulse sequences of the brain and surrounding structures were obtained without and with intravenous contrast. CONTRAST:  38mL GADAVIST GADOBUTROL 1 MMOL/ML IV SOLN COMPARISON:  CT  head 03/28/2021 FINDINGS: Brain: No acute infarction, hemorrhage, hydrocephalus, extra-axial collection or mass lesion. Normal cerebral white matter. Normal temporal lobe anatomy and signal. Normal enhancement following contrast infusion. Vascular: Normal arterial flow voids. Skull and upper cervical spine: Negative Sinuses/Orbits: Mucosal edema paranasal sinuses. Air-fluid levels in the sphenoid sinus. Mild mastoid effusion bilaterally Other: None IMPRESSION: Normal MRI brain with contrast Mucosal edema in the paranasal sinuses and mastoid sinus bilaterally. Electronically Signed   By: Franchot Gallo M.D.   On: 03/29/2021 12:54   ECHOCARDIOGRAM COMPLETE  Result Date: 03/29/2021    ECHOCARDIOGRAM REPORT   Patient Name:   Lori Bean Date of Exam: 03/29/2021 Medical Rec #:  [id number]            Height:       62.0 in Accession #:    [id number]           Weight:       382.9 lb Date of Birth:  11-Mar-1971            BSA:          2.519 m Patient Age:    28 years             BP:           105/66 mmHg Patient Gender: F                    HR:           82  bpm. Exam Location:  Inpatient Procedure: 2D Echo Indications:    acute ischemic heart disease  History:        Patient has prior history of Echocardiogram examinations, most                 recent 04/14/2018. Signs/Symptoms:elevated troponin.  Sonographer:    Johny Chess Referring Phys: [id number] Frederik Pear  Sonographer Comments: Echo performed with patient supine and on artificial respirator. Image acquisition challenging due to patient body habitus. IMPRESSIONS  1. Left ventricular ejection fraction, by estimation, is 60 to 65%. The left ventricle has normal function. The left ventricle has no regional wall motion abnormalities. Left ventricular diastolic parameters were normal.  2. Right ventricular systolic function is normal. The right ventricular size is normal.  3. The mitral valve is normal in structure. No evidence of mitral valve  regurgitation. No evidence of mitral stenosis.  4. The aortic valve is normal in structure. Aortic valve regurgitation is not visualized. No aortic stenosis is present.  5. The inferior vena cava is normal in size with greater than 50% respiratory variability, suggesting right atrial pressure of 3 mmHg. Comparison(s): Prior images unable to be directly viewed, comparison made by report only. FINDINGS  Left Ventricle: Left ventricular ejection fraction, by estimation, is 60 to 65%. The left ventricle has normal function. The left ventricle has no regional wall motion abnormalities. The left ventricular internal cavity size was normal in size. There is  no left ventricular hypertrophy. Left ventricular diastolic parameters were normal. Right Ventricle: The right ventricular size is normal. No increase in right ventricular wall thickness. Right ventricular systolic function is normal. Left Atrium: Left atrial size was normal in size. Right Atrium: Right atrial size was normal in size. Pericardium: There is no evidence of pericardial effusion. Mitral Valve: The mitral valve is normal in structure. No evidence of mitral valve regurgitation. No evidence of mitral valve stenosis. Tricuspid Valve: The tricuspid valve is normal in structure. Tricuspid valve regurgitation is not demonstrated. No evidence of tricuspid stenosis. Aortic Valve: The aortic valve is normal in structure. Aortic valve regurgitation is not visualized. No aortic stenosis is present. Aortic valve mean gradient measures 8.0 mmHg. Aortic valve peak gradient measures 15.1 mmHg. Aortic valve area, by VTI measures 1.92 cm. Pulmonic Valve: The pulmonic valve was normal in structure. Pulmonic valve regurgitation is not visualized. No evidence of pulmonic stenosis. Aorta: The aortic root is normal in size and structure. Venous: The inferior vena cava is normal in size with greater than 50% respiratory variability, suggesting right atrial pressure of 3 mmHg.  IAS/Shunts: No atrial level shunt detected by color flow Doppler.  LEFT VENTRICLE PLAX 2D LVIDd:         4.60 cm  Diastology LVIDs:         2.90 cm  LV e' medial:    7.72 cm/s LV PW:         1.10 cm  LV E/e' medial:  11.4 LV IVS:        1.05 cm  LV e' lateral:   6.64 cm/s LVOT diam:     1.90 cm  LV E/e' lateral: 13.3 LV SV:         64 LV SV Index:   25 LVOT Area:     2.84 cm  RIGHT VENTRICLE RV S prime:     17.80 cm/s TAPSE (M-mode): 2.3 cm LEFT ATRIUM             Index  RIGHT ATRIUM           Index LA diam:        4.00 cm 1.59 cm/m  RA Area:     18.80 cm LA Vol (A2C):   51.0 ml 20.24 ml/m RA Volume:   52.90 ml  21.00 ml/m LA Vol (A4C):   54.3 ml 21.55 ml/m LA Biplane Vol: 52.4 ml 20.80 ml/m  AORTIC VALVE AV Area (Vmax):    2.13 cm AV Area (Vmean):   1.95 cm AV Area (VTI):     1.92 cm AV Vmax:           194.00 cm/s AV Vmean:          132.000 cm/s AV VTI:            0.332 m AV Peak Grad:      15.1 mmHg AV Mean Grad:      8.0 mmHg LVOT Vmax:         146.00 cm/s LVOT Vmean:        90.600 cm/s LVOT VTI:          0.225 m LVOT/AV VTI ratio: 0.68  AORTA Ao Root diam: 2.80 cm Ao Asc diam:  2.90 cm MITRAL VALVE MV Area (PHT): 2.83 cm    SHUNTS MV Decel Time: 268 msec    Systemic VTI:  0.22 m MV E velocity: 88.10 cm/s  Systemic Diam: 1.90 cm MV A velocity: 78.80 cm/s MV E/A ratio:  1.12 Mihai Croitoru MD Electronically signed by Sanda Klein MD Signature Date/Time: 03/29/2021/1:45:51 PM    Final     Cardiac Studies   Echo 03/29/21:  1. Left ventricular ejection fraction, by estimation, is 60 to 65%. The  left ventricle has normal function. The left ventricle has no regional  wall motion abnormalities. Left ventricular diastolic parameters were  normal.   2. Right ventricular systolic function is normal. The right ventricular  size is normal.   3. The mitral valve is normal in structure. No evidence of mitral valve  regurgitation. No evidence of mitral stenosis.   4. The aortic valve is normal in  structure. Aortic valve regurgitation is  not visualized. No aortic stenosis is present.   5. The inferior vena cava is normal in size with greater than 50%  respiratory variability, suggesting right atrial pressure of 3 mmHg.   Patient Profile     50 y.o. female with chronic diastolic heart failure, hypertensive heart disease, asthma, OSA on CPAP, lymphedema, schizophrenia, bipolar disorder, obesity and prior PE admitted with hypercarbic respiratory failure requiring intubation.  Cardiology was consulted for elevated troponin.   Assessment & Plan    # Elevated troponin:  Patient was admitted with hypercarbic respiratory failure requiring intubation.  High-sensitivity troponin was elevated to 1735.  The only episode of chest pain she recalls is having some chest discomfort after eating spicy hot wings.  She denies any discomfort with exertion.  EKG has been without acute ischemic changes.  Given the degree of elevation of her troponin and the pattern, agree with getting an ischemic evaluation.  We will get a coronary CT-A.  Today she is still feeling short of breath and is not sure if she can lay flat.  We will try tomorrow if she is feeling better.  SHe has completed 48 hours of IV heparin so it can be discontinued.   # Chronic diastolic heart failure: She is euvolemic.   # Hyperlipidemia:  Continue atorvastatin.   # HTN:  Home HCTZ-triamterene, valsartan and metoprolol are on hold. Critical care to resume valsartan today.        For questions or updates, please contact Center Please consult [email or web address] for contact info under        Signed, [name], MD  03/31/2021, 10:28 AM

## 2021-04-01 ENCOUNTER — Inpatient Hospital Stay (HOSPITAL_COMMUNITY): Payer: Medicaid Other

## 2021-04-01 ENCOUNTER — Encounter (HOSPITAL_COMMUNITY): Payer: Self-pay | Admitting: Pulmonary Disease

## 2021-04-01 DIAGNOSIS — I10 Essential (primary) hypertension: Secondary | ICD-10-CM | POA: Diagnosis not present

## 2021-04-01 DIAGNOSIS — R778 Other specified abnormalities of plasma proteins: Secondary | ICD-10-CM

## 2021-04-01 DIAGNOSIS — G9341 Metabolic encephalopathy: Secondary | ICD-10-CM | POA: Diagnosis not present

## 2021-04-01 DIAGNOSIS — R079 Chest pain, unspecified: Secondary | ICD-10-CM

## 2021-04-01 LAB — CBC
HCT: 33.9 % — ABNORMAL LOW (ref 36.0–46.0)
Hemoglobin: 10.9 g/dL — ABNORMAL LOW (ref 12.0–15.0)
MCH: 31.8 pg (ref 26.0–34.0)
MCHC: 32.2 g/dL (ref 30.0–36.0)
MCV: 98.8 fL (ref 80.0–100.0)
Platelets: 168 10*3/uL (ref 150–400)
RBC: 3.43 MIL/uL — ABNORMAL LOW (ref 3.87–5.11)
RDW: 14.4 % (ref 11.5–15.5)
WBC: 7.7 10*3/uL (ref 4.0–10.5)
nRBC: 0 % (ref 0.0–0.2)

## 2021-04-01 LAB — BASIC METABOLIC PANEL
Anion gap: 3 — ABNORMAL LOW (ref 5–15)
BUN: 9 mg/dL (ref 6–20)
CO2: 27 mmol/L (ref 22–32)
Calcium: 8.8 mg/dL — ABNORMAL LOW (ref 8.9–10.3)
Chloride: 110 mmol/L (ref 98–111)
Creatinine, Ser: 0.59 mg/dL (ref 0.44–1.00)
GFR, Estimated: >60 mL/min (ref >60)
Glucose, Bld: 133 mg/dL — ABNORMAL HIGH (ref 70–99)
Potassium: 4.1 mmol/L (ref 3.5–5.1)
Sodium: 140 mmol/L (ref 135–145)

## 2021-04-01 LAB — GLUCOSE, CAPILLARY
Glucose-Capillary: 114 mg/dL — ABNORMAL HIGH (ref 70–99)
Glucose-Capillary: 121 mg/dL — ABNORMAL HIGH (ref 70–99)
Glucose-Capillary: 141 mg/dL — ABNORMAL HIGH (ref 70–99)

## 2021-04-01 MED ORDER — TRIAMTERENE-HCTZ 37.5-25 MG PO TABS
1.0000 | ORAL_TABLET | Freq: Every day | ORAL | Status: DC
  Administered 2021-04-01 – 2021-04-03 (×3): 1 via ORAL
  Filled 2021-04-01 (×3): qty 1

## 2021-04-01 MED ORDER — NITROGLYCERIN 0.4 MG SL SUBL
SUBLINGUAL_TABLET | SUBLINGUAL | Status: AC
  Filled 2021-04-01: qty 2

## 2021-04-01 MED ORDER — METOPROLOL TARTRATE 5 MG/5ML IV SOLN
INTRAVENOUS | Status: AC
  Filled 2021-04-01: qty 5

## 2021-04-01 MED ORDER — GABAPENTIN 300 MG PO CAPS
300.0000 mg | ORAL_CAPSULE | Freq: Three times a day (TID) | ORAL | Status: DC
  Administered 2021-04-01 – 2021-04-03 (×5): 300 mg via ORAL
  Filled 2021-04-01 (×5): qty 1

## 2021-04-01 MED ORDER — DULOXETINE HCL 30 MG PO CPEP
30.0000 mg | ORAL_CAPSULE | Freq: Two times a day (BID) | ORAL | Status: DC
  Administered 2021-04-01 – 2021-04-03 (×4): 30 mg via ORAL
  Filled 2021-04-01 (×4): qty 1

## 2021-04-01 MED ORDER — METOPROLOL TARTRATE 100 MG PO TABS
100.0000 mg | ORAL_TABLET | Freq: Once | ORAL | Status: AC
  Administered 2021-04-01: 100 mg via ORAL
  Filled 2021-04-01: qty 1

## 2021-04-01 MED ORDER — IPRATROPIUM-ALBUTEROL 0.5-2.5 (3) MG/3ML IN SOLN: 3.0000 mL | Freq: Four times a day (QID) | RESPIRATORY_TRACT | Status: DC | PRN

## 2021-04-01 MED ORDER — IRBESARTAN 75 MG PO TABS
75.0000 mg | ORAL_TABLET | Freq: Every day | ORAL | Status: DC
  Administered 2021-04-01 – 2021-04-02 (×2): 75 mg via ORAL
  Filled 2021-04-01 (×3): qty 1

## 2021-04-01 MED ORDER — IOHEXOL 350 MG/ML SOLN
100.0000 mL | Freq: Once | INTRAVENOUS | Status: AC | PRN
  Administered 2021-04-01: 100 mL via INTRAVENOUS

## 2021-04-01 NOTE — Progress Notes (Signed)
SLP Cancellation Note  Patient Details Name: Lori Bean MRN: [id number] DOB: May 20, 1971   Cancelled treatment:       Reason Eval/Treat Not Completed: Other (comment); pt NPO for upcoming procedure, unable to assess this date for diet check. Of note pts diet has been upgraded since 7/17 pm and no issues reported. Will s/o for now, if any issues arise please reconsult.   [place], CCC-SLP Acute Rehabilitation Services   04/01/2021, 12:09 PM

## 2021-04-01 NOTE — Progress Notes (Signed)
Physical Therapy Treatment Patient Details Name: Lori Bean MRN: [id number] DOB: 1971/08/08 Today's Date: 04/01/2021    History of Present Illness 50 y/o female admitted 7/15 with acute metabolic encephalopathy following an asthma exacerbation. Intubated 7/15-7/17 PMH: OSA, asthma, diastolic HF, D1SH, anxiety, bipolar disorder, and schizophrenia.    PT Comments    Pt making steady progress with mobility. Did well with rollator and it will allow her to ambulate longer distances at home/community.   Follow Up Recommendations  Home health PT;Supervision - Intermittent     Equipment Recommendations  Other (comment) (rollator)    Recommendations for Other Services       Precautions / Restrictions Precautions Precautions: Fall    Mobility  Bed Mobility Overal bed mobility: Modified Independent Bed Mobility: Sit to Supine       Sit to supine: Modified independent (Device/Increase time);HOB elevated   General bed mobility comments: Incr time    Transfers Overall transfer level: Needs assistance Equipment used: Rolling walker (2 wheeled);4-wheeled walker Transfers: Sit to/from Stand Sit to Stand: Supervision         General transfer comment: supervision for safety  Ambulation/Gait Ambulation/Gait assistance: Supervision Gait Distance (Feet): 60 Feet (x 2) Assistive device: Rolling walker (2 wheeled);4-wheeled walker Gait Pattern/deviations: Step-through pattern;Decreased stride length;Trunk flexed;Wide base of support Gait velocity: decr Gait velocity interpretation: <1.31 ft/sec, indicative of household ambulator General Gait Details: Assist for safety. Pt used rolling walker initially and took seated rest break. Amb back to room using rollator.   Stairs             Wheelchair Mobility    Modified Rankin (Stroke Patients Only)       Balance Overall balance assessment: Needs assistance Sitting-balance support: No upper extremity  supported;Feet supported Sitting balance-Leahy Scale: Good     Standing balance support: During functional activity;No upper extremity supported Standing balance-Leahy Scale: Fair Standing balance comment: Static standing without support. UE support for dynamic activities                            Cognition Arousal/Alertness: Awake/alert Behavior During Therapy: WFL for tasks assessed/performed Overall Cognitive Status: Within Functional Limits for tasks assessed                                        Exercises      General Comments General comments (skin integrity, edema, etc.): Pt amb on RA and SpO2 99%. Dyspnea 2/4      Pertinent Vitals/Pain Pain Assessment: No/denies pain    Home Living                      Prior Function            PT Goals (current goals can now be found in the care plan section) Acute Rehab PT Goals Patient Stated Goal: To return home. Progress towards PT goals: Progressing toward goals    Frequency    Min 3X/week      PT Plan Current plan remains appropriate;Equipment recommendations need to be updated    Co-evaluation              AM-PAC PT "6 Clicks" Mobility   Outcome Measure  Help needed turning from your back to your side while in a flat bed without using bedrails?: None Help needed moving from lying on  your back to sitting on the side of a flat bed without using bedrails?: A Little Help needed moving to and from a bed to a chair (including a wheelchair)?: A Little Help needed standing up from a chair using your arms (e.g., wheelchair or bedside chair)?: A Little Help needed to walk in hospital room?: A Little Help needed climbing 3-5 steps with a railing? : A Little 6 Click Score: 19    End of Session   Activity Tolerance: Patient tolerated treatment well Patient left: in bed;Other (comment);with family/visitor present (transporter present to take her for test) Nurse  Communication: Mobility status PT Visit Diagnosis: Other abnormalities of gait and mobility (R26.89);Muscle weakness (generalized) (M62.81);Unsteadiness on feet (R26.81)     Time: 2683-4196 PT Time Calculation (min) (ACUTE ONLY): 15 min  Charges:  $Gait Training: 8-22 mins                     [place] Pager [phone] Office Central 04/01/2021, 4:16 PM

## 2021-04-01 NOTE — Progress Notes (Signed)
PROGRESS NOTE    Lori Bean  MWN:[id number] DOB: 12-Jan-1971 DOA: 03/28/2021 PCP: [name], PA   Brief Narrative: 50 year old with past medical history significant for asthma, OSA, bipolar, schizophrenia, who presented on 7/15 with altered mental status.  EMS was called and patient was unresponsive, hypotensive and in respiratory distress.  Patient received Narcan, breathing was assisted with BVM.  In the ED she was found to be hypertensive,  she was intubated and sedated.   CT head negative, ABG showed pH of 7.1 PCO2 70.  Ammonia and 189.  Significant events: 7/15; patient was admitted to ICU for acute metabolic encephalopathy, intubated, CT head negative. 7/17; Patient was extubated, MRI was negative. 7/19; Patient care transferred to [place]:   Active Problems:   Acute metabolic encephalopathy  1-Acute Metabolic Encephalopathy: Secondary to acute hypercapnic respiratory failure, elevation of ammonia Resolved.  Alert and oriented.   2-Acute Hypercapnic and Hypoxic Respiratory Failure: -History of asthma, OSA -Smoker -Patient was intubated on admission 7/15----extubated 7/17. -on IV ceftriaxone treating for PNA>  -On Pulmicort, nebulizer, singular.  -resume home CPAP.   3-Elevation of troponin: Non-STEMI versus demand ischemia ECHO; EF 60 %, normal Diastolic parameters.  Troponin Peaked at 1735---913---576 Received heparin drip for 48 hours. Cardiology planning coronary CT for ischemic evaluation during this hospitalization. For CT coronary today.   Transaminases;  Elevation ammonia;  Ammonia 189--30 resolved.  Repeat LFT>   Hypertriglyceridemia;  On lipitor.  Check lipid panel.   Diabetes type 2 hyperglycemia: A1c 7.2 SSI  On glipizide and metformin as out patient. Holding while inpatient.t   Hypertension: Malignant hypertension On Avapro.  Maxzide resume today.  PRN hydralazine.   History of bipolar,  schizophrenia: Resume Cymbalta and gabapentin.     Nutrition Problem: Inadequate oral intake Etiology: inability to eat    Signs/Symptoms: NPO status    Interventions: Refer to RD note for recommendations  Estimated body mass index is 51.41 kg/m as calculated from the following:   Height as of this encounter: 5\' 2"  (1.575 m).   Weight as of this encounter: 127.5 kg.   DVT prophylaxis: Lovenox Code Status: Full Code Family Communication: Care discussed with patient.  Disposition Plan:  Status is: Inpatient  Remains inpatient appropriate because:IV treatments appropriate due to intensity of illness or inability to take PO  Dispo: The patient is from: Home              Anticipated d/c is to: Home              Patient currently is not medically stable to d/c.   Difficult to place patient No        Consultants:  Cardiology   Procedures:    Antimicrobials:    Subjective: She is breathing better, denies chest pain.    Objective: Vitals:   04/01/21 0333 04/01/21 0400 04/01/21 0500 04/01/21 0600  BP:  (!) 162/82 (!) 147/89 (!) 153/81  Pulse:  72 80 76  Resp:  16 18 15   Temp: 98.1 F (36.7 C)     TempSrc: Oral     SpO2:  98% 96% 96%  Weight:   127.5 kg   Height:        Intake/Output Summary (Last 24 hours) at 04/01/2021 0732 Last data filed at 04/01/2021 0400 Gross per 24 hour  Intake 1114.66 ml  Output 570 ml  Net 544.66 ml   Filed Weights   03/30/21 0500 03/31/21 0415 04/01/21 0500  Weight: 125.5 kg 125.5 kg 127.5 kg    Examination:  General exam: Appears calm and comfortable  Respiratory system: Clear to auscultation. Respiratory effort normal. Cardiovascular system: S1 & S2 heard, RRR. No JVD, murmurs, rubs, gallops or clicks. No pedal edema. Gastrointestinal system: Abdomen is nondistended, soft and nontender. No organomegaly or masses felt. Normal bowel sounds heard. Central nervous system: Alert and oriented.  Extremities: Symmetric 5  x 5 power.   Data Reviewed: I have personally reviewed following labs and imaging studies  CBC: Recent Labs  Lab 03/28/21 0653 03/28/21 0655 03/28/21 2315 03/29/21 0523 03/29/21 1015 03/30/21 0349 03/31/21 0407 04/01/21 0334  WBC 26.2*  --  9.9  --   --  11.9* 9.7 7.7  NEUTROABS 10.5*  --   --   --   --   --   --   --   HGB 13.9   < > 13.4 11.6* 12.9 11.6* 11.2* 10.9*  HCT 45.7   < > 40.6 34.0* 38.0 35.8* 34.8* 33.9*  MCV 105.5*  --  99.5  --   --  100.6* 98.9 98.8  PLT 250  --  188  --   --  147* 167 168   < > = values in this interval not displayed.   Basic Metabolic Panel: Recent Labs  Lab 03/28/21 0653 03/28/21 0655 03/28/21 0829 03/28/21 2315 03/29/21 0523 03/29/21 1015 03/30/21 0349 03/31/21 0407 04/01/21 0334  NA 136 135   < > 140 140 141 138 139 140  K 5.1 5.0   < > 3.5 3.3* 3.3* 3.9 3.4* 4.1  CL 101 105  --  105  --   --  110 109 110  CO2 16*  --   --  26  --   --  23 23 27   GLUCOSE 341* 335*  --  131*  --   --  184* 122* 133*  BUN 13 15  --  13  --   --  7 8 9   CREATININE 1.21* 1.10*  --  1.00  --   --  0.64 0.66 0.59  CALCIUM 9.1  --   --  9.0  --   --  8.6* 8.7* 8.8*  MG  --   --   --  2.1  --   --   --   --   --    < > = values in this interval not displayed.   GFR: Estimated Creatinine Clearance: 106.5 mL/min (by C-G formula based on SCr of 0.59 mg/dL). Liver Function Tests: Recent Labs  Lab 03/28/21 0653 03/28/21 2315  AST 102* 99*  ALT 96* 157*  ALKPHOS 80 71  BILITOT 0.5 0.6  PROT 6.3* 6.4*  ALBUMIN 3.2* 3.1*   No results for input(s): LIPASE, AMYLASE in the last 168 hours. Recent Labs  Lab 03/28/21 0653 03/28/21 2315  AMMONIA 189* 30   Coagulation Profile: Recent Labs  Lab 03/28/21 2315  INR 1.0   Cardiac Enzymes: No results for input(s): CKTOTAL, CKMB, CKMBINDEX, TROPONINI in the last 168 hours. BNP (last 3 results) Recent Labs    06/21/20 1004  PROBNP 16.0   HbA1C: No results for input(s): HGBA1C in the last 72  hours. CBG: Recent Labs  Lab 03/30/21 2120 03/31/21 0740 03/31/21 1102 03/31/21 1649 03/31/21 2152  GLUCAP 117* 108* 119* 111* 135*   Lipid Profile: Recent Labs    03/30/21 0349  TRIG 125   Thyroid Function Tests: No results for input(s): TSH, T4TOTAL,  FREET4, T3FREE, THYROIDAB in the last 72 hours. Anemia Panel: No results for input(s): VITAMINB12, FOLATE, FERRITIN, TIBC, IRON, RETICCTPCT in the last 72 hours. Sepsis Labs: Recent Labs  Lab 03/28/21 9417 03/28/21 2315  LATICACIDVEN >11.0* 1.4    Recent Results (from the past 240 hour(s))  Resp Panel by RT-PCR (Flu A&B, Covid) Nasopharyngeal Swab     Status: None   Collection Time: 03/28/21  7:19 AM   Specimen: Nasopharyngeal Swab; Nasopharyngeal(NP) swabs in vial transport medium  Result Value Ref Range Status   SARS Coronavirus 2 by RT PCR NEGATIVE NEGATIVE Final    Comment: (NOTE) SARS-CoV-2 target nucleic acids are NOT DETECTED.  The SARS-CoV-2 RNA is generally detectable in upper respiratory specimens during the acute phase of infection. The lowest concentration of SARS-CoV-2 viral copies this assay can detect is 138 copies/mL. A negative result does not preclude SARS-Cov-2 infection and should not be used as the sole basis for treatment or other patient management decisions. A negative result may occur with  improper specimen collection/handling, submission of specimen other than nasopharyngeal swab, presence of viral mutation(s) within the areas targeted by this assay, and inadequate number of viral copies(<138 copies/mL). A negative result must be combined with clinical observations, patient history, and epidemiological information. The expected result is Negative.  Fact Sheet for Patients:  EntrepreneurPulse.com.au  Fact Sheet for Healthcare Providers:  IncredibleEmployment.be  This test is no t yet approved or cleared by the [place] FDA and  has been  authorized for detection and/or diagnosis of SARS-CoV-2 by FDA under an Emergency Use Authorization (EUA). This EUA will remain  in effect (meaning this test can be used) for the duration of the COVID-19 declaration under Section 564(b)(1) of the Act, 21 U.S.C.section 360bbb-3(b)(1), unless the authorization is terminated  or revoked sooner.       Influenza A by PCR NEGATIVE NEGATIVE Final   Influenza B by PCR NEGATIVE NEGATIVE Final    Comment: (NOTE) The Xpert Xpress SARS-CoV-2/FLU/RSV plus assay is intended as an aid in the diagnosis of influenza from Nasopharyngeal swab specimens and should not be used as a sole basis for treatment. Nasal washings and aspirates are unacceptable for Xpert Xpress SARS-CoV-2/FLU/RSV testing.  Fact Sheet for Patients: EntrepreneurPulse.com.au  Fact Sheet for Healthcare Providers: IncredibleEmployment.be  This test is not yet approved or cleared by the [place] FDA and has been authorized for detection and/or diagnosis of SARS-CoV-2 by FDA under an Emergency Use Authorization (EUA). This EUA will remain in effect (meaning this test can be used) for the duration of the COVID-19 declaration under Section 564(b)(1) of the Act, 21 U.S.C. section 360bbb-3(b)(1), unless the authorization is terminated or revoked.  Performed at [facility], [place] [address], [place], [place] [address]   MRSA Next Gen by PCR, Nasal     Status: None   Collection Time: 03/28/21  3:29 PM   Specimen: Nasal Mucosa; Nasal Swab  Result Value Ref Range Status   MRSA by PCR Next Gen NOT DETECTED NOT DETECTED Final    Comment: (NOTE) The GeneXpert MRSA Assay (FDA approved for NASAL specimens only), is one component of a comprehensive MRSA colonization surveillance program. It is not intended to diagnose MRSA infection nor to guide or monitor treatment for MRSA infections. Test performance is not FDA approved in patients less than  56 years old. Performed at [facility], [place] [address], [place], [place] [address]   Blood culture (routine x 2)  Status: None (Preliminary result)   Collection Time: 03/28/21  8:50 PM   Specimen: BLOOD LEFT HAND  Result Value Ref Range Status   Specimen Description BLOOD LEFT HAND  Final   Special Requests   Final    BOTTLES DRAWN AEROBIC ONLY Blood Culture adequate volume   Culture   Final    NO GROWTH 4 DAYS Performed at [facility], [address], [place], [place] [address]    Report Status PENDING  Incomplete  Blood culture (routine x 2)     Status: None (Preliminary result)   Collection Time: 03/28/21 11:15 PM   Specimen: BLOOD  Result Value Ref Range Status   Specimen Description BLOOD SITE NOT SPECIFIED  Final   Special Requests   Final    BOTTLES DRAWN AEROBIC AND ANAEROBIC Blood Culture adequate volume   Culture   Final    NO GROWTH 3 DAYS Performed at [facility], [address], [place], [place] [address]    Report Status PENDING  Incomplete         Radiology Studies: No results found.      Scheduled Meds:  aspirin  81 mg Oral Daily   atorvastatin  80 mg Oral Daily   budesonide (PULMICORT) nebulizer solution  0.25 mg Nebulization BID   Chlorhexidine Gluconate Cloth  6 each Topical Daily   enoxaparin (LOVENOX) injection  60 mg Subcutaneous Q24H   insulin aspart  0-15 Units Subcutaneous TID AC & HS   ipratropium-albuterol  3 mL Nebulization BID   mouth rinse  15 mL Mouth Rinse BID   montelukast  10 mg Oral QHS   nicotine  7 mg Transdermal Daily   traZODone  50 mg Oral QHS   vitamin B-12  500 mcg Oral Daily   Continuous Infusions:  sodium chloride Stopped (03/31/21 1749)   cefTRIAXone (ROCEPHIN)  IV Stopped (03/31/21 1358)     LOS: 4 days    Time spent: 35 minutes.     Elmarie Shiley, MD [facility]   If 7PM-7AM, please contact night-coverage [email or web address]  04/01/2021, 7:32 AM

## 2021-04-01 NOTE — Progress Notes (Signed)
Progress Note  Patient Name: Lori Bean Date of Encounter: 04/01/2021  [facility] Cardiologist: [name], MD   Subjective   Feeling much better.   Denies chest pain or shortness of breath.   Inpatient Medications    Scheduled Meds:  aspirin  81 mg Oral Daily   atorvastatin  80 mg Oral Daily   budesonide (PULMICORT) nebulizer solution  0.25 mg Nebulization BID   Chlorhexidine Gluconate Cloth  6 each Topical Daily   enoxaparin (LOVENOX) injection  60 mg Subcutaneous Q24H   insulin aspart  0-15 Units Subcutaneous TID AC & HS   ipratropium-albuterol  3 mL Nebulization BID   mouth rinse  15 mL Mouth Rinse BID   montelukast  10 mg Oral QHS   nicotine  7 mg Transdermal Daily   traZODone  50 mg Oral QHS   vitamin B-12  500 mcg Oral Daily   Continuous Infusions:  sodium chloride Stopped (03/31/21 1749)   cefTRIAXone (ROCEPHIN)  IV Stopped (03/31/21 1358)   PRN Meds: sodium chloride, acetaminophen, docusate sodium, hydrALAZINE   Vital Signs    Vitals:   04/01/21 0500 04/01/21 0600 04/01/21 0700 04/01/21 0800  BP: (!) 147/89 (!) 153/81 (!) 153/82 (!) 169/98  Pulse: 80 76 76 84  Resp: 18 15 16 11   Temp:   98 F (36.7 C)   TempSrc:   Oral   SpO2: 96% 96% 98% 96%  Weight: 127.5 kg     Height:        Intake/Output Summary (Last 24 hours) at 04/01/2021 0902 Last data filed at 04/01/2021 0400 Gross per 24 hour  Intake 1054.52 ml  Output 470 ml  Net 584.52 ml   Last 3 Weights 04/01/2021 03/31/2021 03/30/2021  Weight (lbs) 281 lb 1.4 oz 276 lb 10.8 oz 276 lb 10.8 oz  Weight (kg) 127.5 kg 125.5 kg 125.5 kg      Telemetry    Sinus rhythm.  - Personally Reviewed  ECG   N/a - Personally Reviewed  Physical Exam   GEN: No acute distress.   Neck: No JVD Cardiac: RRR, no murmurs, rubs, or gallops.  Respiratory: Clear to auscultation bilaterally. GI: Soft, nontender, non-distended  MS: No edema; No deformity. Neuro:  Nonfocal  Psych: Normal affect    Labs    High Sensitivity Troponin:   Recent Labs  Lab 03/12/21 1822 03/28/21 0653 03/28/21 2315 03/29/21 0608 03/29/21 1021  TROPONINIHS 4 44* 1,735* 913* 576*      Chemistry Recent Labs  Lab 03/28/21 1517 03/28/21 0655 03/28/21 2315 03/29/21 0523 03/30/21 0349 03/31/21 0407 04/01/21 0334  NA 136   < > 140   < > 138 139 140  K 5.1   < > 3.5   < > 3.9 3.4* 4.1  CL 101   < > 105  --  110 109 110  CO2 16*  --  26  --  23 23 27   GLUCOSE 341*   < > 131*  --  184* 122* 133*  BUN 13   < > 13  --  7 8 9   CREATININE 1.21*   < > 1.00  --  0.64 0.66 0.59  CALCIUM 9.1  --  9.0  --  8.6* 8.7* 8.8*  PROT 6.3*  --  6.4*  --   --   --   --   ALBUMIN 3.2*  --  3.1*  --   --   --   --   AST 102*  --  99*  --   --   --   --   ALT 96*  --  157*  --   --   --   --   ALKPHOS 80  --  71  --   --   --   --   BILITOT 0.5  --  0.6  --   --   --   --   GFRNONAA 54*  --  >60  --  >60 >60 >60  ANIONGAP 19*  --  9  --  5 7 3*   < > = values in this interval not displayed.     Hematology Recent Labs  Lab 03/30/21 0349 03/31/21 0407 04/01/21 0334  WBC 11.9* 9.7 7.7  RBC 3.56* 3.52* 3.43*  HGB 11.6* 11.2* 10.9*  HCT 35.8* 34.8* 33.9*  MCV 100.6* 98.9 98.8  MCH 32.6 31.8 31.8  MCHC 32.4 32.2 32.2  RDW 14.0 14.2 14.4  PLT 147* 167 168    BNP Recent Labs  Lab 03/28/21 0653  BNP 43.4     DDimer No results for input(s): DDIMER in the last 168 hours.   Radiology    No results found.  Cardiac Studies   Echo 03/29/21:  1. Left ventricular ejection fraction, by estimation, is 60 to 65%. The  left ventricle has normal function. The left ventricle has no regional  wall motion abnormalities. Left ventricular diastolic parameters were  normal.   2. Right ventricular systolic function is normal. The right ventricular  size is normal.   3. The mitral valve is normal in structure. No evidence of mitral valve  regurgitation. No evidence of mitral stenosis.   4. The aortic valve is  normal in structure. Aortic valve regurgitation is  not visualized. No aortic stenosis is present.   5. The inferior vena cava is normal in size with greater than 50%  respiratory variability, suggesting right atrial pressure of 3 mmHg.   Patient Profile     50 y.o. female with chronic diastolic heart failure, hypertensive heart disease, asthma, OSA on CPAP, lymphedema, schizophrenia, bipolar disorder, obesity and prior PE admitted with hypercarbic respiratory failure requiring intubation.  Cardiology was consulted for elevated troponin.   Assessment & Plan    # Elevated troponin:  Patient was admitted with hypercarbic respiratory failure requiring intubation.  High-sensitivity troponin was elevated to 1735.  The only episode of chest pain she recalls is having some chest discomfort after eating spicy hot wings.  She denies any discomfort with exertion.  EKG has been without acute ischemic changes.  Given the degree of elevation of her troponin and the pattern, agree with getting an ischemic evaluation.  We will get a coronary CT-A today to assess her coronaries.  Heparin was stopped after 48 hours.   # Chronic diastolic heart failure: She is euvolemic.   # Hyperlipidemia:  Continue atorvastatin.   # HTN:  Home HCTZ-triamterene, valsartan and metoprolol are have been on hold. BP is poorly controlled.  Resume home HCTZ-triamterene and valsartan.        For questions or updates, please contact [place] Please consult [email or web address] for contact info under        Signed, [name], MD  04/01/2021, 9:02 AM

## 2021-04-01 NOTE — Progress Notes (Signed)
Attempted report.  Was told to wait 15 minutes.

## 2021-04-02 ENCOUNTER — Inpatient Hospital Stay (HOSPITAL_COMMUNITY): Payer: Medicaid Other

## 2021-04-02 DIAGNOSIS — I248 Other forms of acute ischemic heart disease: Secondary | ICD-10-CM

## 2021-04-02 LAB — LIPID PANEL
Cholesterol: 141 mg/dL (ref 0–200)
HDL: 48 mg/dL (ref >40)
LDL Cholesterol: 66 mg/dL (ref 0–99)
Total CHOL/HDL Ratio: 2.9 RATIO
Triglycerides: 135 mg/dL (ref <150)
VLDL: 27 mg/dL (ref 0–40)

## 2021-04-02 LAB — BASIC METABOLIC PANEL
Anion gap: 7 (ref 5–15)
BUN: 8 mg/dL (ref 6–20)
CO2: 28 mmol/L (ref 22–32)
Calcium: 9.2 mg/dL (ref 8.9–10.3)
Chloride: 103 mmol/L (ref 98–111)
Creatinine, Ser: 0.58 mg/dL (ref 0.44–1.00)
GFR, Estimated: >60 mL/min (ref >60)
Glucose, Bld: 128 mg/dL — ABNORMAL HIGH (ref 70–99)
Potassium: 3.9 mmol/L (ref 3.5–5.1)
Sodium: 138 mmol/L (ref 135–145)

## 2021-04-02 LAB — CULTURE, BLOOD (ROUTINE X 2)
Culture: NO GROWTH
Special Requests: ADEQUATE

## 2021-04-02 LAB — GLUCOSE, CAPILLARY
Glucose-Capillary: 162 mg/dL — ABNORMAL HIGH (ref 70–99)
Glucose-Capillary: 122 mg/dL — ABNORMAL HIGH (ref 70–99)
Glucose-Capillary: 115 mg/dL — ABNORMAL HIGH (ref 70–99)
Glucose-Capillary: 150 mg/dL — ABNORMAL HIGH (ref 70–99)

## 2021-04-02 LAB — HEPATIC FUNCTION PANEL
ALT: 54 U/L — ABNORMAL HIGH (ref 0–44)
AST: 19 U/L (ref 15–41)
Albumin: 2.8 g/dL — ABNORMAL LOW (ref 3.5–5.0)
Alkaline Phosphatase: 52 U/L (ref 38–126)
Bilirubin, Direct: <0.1 mg/dL (ref 0.0–0.2)
Total Bilirubin: 0.5 mg/dL (ref 0.3–1.2)
Total Protein: 5.8 g/dL — ABNORMAL LOW (ref 6.5–8.1)

## 2021-04-02 LAB — CBC
HCT: 35 % — ABNORMAL LOW (ref 36.0–46.0)
Hemoglobin: 11.4 g/dL — ABNORMAL LOW (ref 12.0–15.0)
MCH: 31.5 pg (ref 26.0–34.0)
MCHC: 32.6 g/dL (ref 30.0–36.0)
MCV: 96.7 fL (ref 80.0–100.0)
Platelets: 175 10*3/uL (ref 150–400)
RBC: 3.62 MIL/uL — ABNORMAL LOW (ref 3.87–5.11)
RDW: 14.1 % (ref 11.5–15.5)
WBC: 9.1 10*3/uL (ref 4.0–10.5)
nRBC: 0 % (ref 0.0–0.2)

## 2021-04-02 LAB — VITAMIN B1: Vitamin B1 (Thiamine): 97.9 nmol/L (ref 66.5–200.0)

## 2021-04-02 MED ORDER — IRBESARTAN 75 MG PO TABS
75.0000 mg | ORAL_TABLET | Freq: Once | ORAL | Status: AC
  Administered 2021-04-02: 75 mg via ORAL
  Filled 2021-04-02: qty 1

## 2021-04-02 MED ORDER — IRBESARTAN 75 MG PO TABS
150.0000 mg | ORAL_TABLET | Freq: Every day | ORAL | Status: DC
  Administered 2021-04-03: 150 mg via ORAL
  Filled 2021-04-02: qty 2

## 2021-04-02 MED ORDER — IOHEXOL 350 MG/ML SOLN
65.0000 mL | Freq: Once | INTRAVENOUS | Status: AC | PRN
  Administered 2021-04-02: 65 mL via INTRAVENOUS

## 2021-04-02 NOTE — Progress Notes (Signed)
Occupational Therapy Treatment Patient Details Name: Lori Bean MRN: [id number] DOB: Jun 25, 1971 Today's Date: 04/02/2021    History of present illness 50 y/o female admitted 7/15 with acute metabolic encephalopathy following an asthma exacerbation. Intubated 7/15-7/17 PMH: OSA, asthma, diastolic HF, W0JW, anxiety, bipolar disorder, and schizophrenia.   OT comments  OT treatment session with focus on self-care re-education and household mobility with use of RW. Patient continues to make good progress toward goals completing functional mobility up to 80-149ft with use of RW this date. SpO2 93% after mobility on RA. OT will continue to follow acutely.    Follow Up Recommendations  Home health OT    Equipment Recommendations  None recommended by OT;Other (comment) (Patient has necessary DME.)    Recommendations for Other Services      Precautions / Restrictions Precautions Precautions: Fall Restrictions Weight Bearing Restrictions: No       Mobility Bed Mobility Overal bed mobility: Modified Independent Bed Mobility: Sit to Supine                Transfers Overall transfer level: Needs assistance Equipment used: Rolling walker (2 wheeled);4-wheeled walker Transfers: Sit to/from Stand Sit to Stand: Supervision         General transfer comment: supervision for safety    Balance Overall balance assessment: Needs assistance Sitting-balance support: No upper extremity supported;Feet supported Sitting balance-Leahy Scale: Good     Standing balance support: During functional activity;No upper extremity supported Standing balance-Leahy Scale: Fair Standing balance comment: Static standing without support. UE support for dynamic activities                           ADL either performed or assessed with clinical judgement   ADL                                               Vision       Perception     Praxis       Cognition Arousal/Alertness: Awake/alert Behavior During Therapy: WFL for tasks assessed/performed Overall Cognitive Status: Within Functional Limits for tasks assessed                                          Exercises     Shoulder Instructions       General Comments Pt amb on RA and SpO2 92%. Dyspnea 2/4.    Pertinent Vitals/ Pain       Pain Assessment: No/denies pain  Home Living                                          Prior Functioning/Environment              Frequency  Min 2X/week        Progress Toward Goals  OT Goals(current goals can now be found in the care plan section)  Progress towards OT goals: Progressing toward goals  Acute Rehab OT Goals Patient Stated Goal: To return home. OT Goal Formulation: With patient Time For Goal Achievement: 04/14/21 Potential to Achieve Goals: Good ADL Goals Pt Will Perform Grooming: with modified independence;standing Pt Will  Perform Upper Body Dressing: with modified independence Pt Will Perform Lower Body Dressing: with modified independence;with adaptive equipment;sitting/lateral leans;sit to/from stand Pt Will Transfer to Toilet: with modified independence;ambulating Pt Will Perform Toileting - Clothing Manipulation and hygiene: with modified independence;sit to/from stand;sitting/lateral leans Additional ADL Goal #1: Patient will tolerate 10-15 minutes of therapeutic activity without need for rest break indicating increased activity tolerance.  Plan Discharge plan remains appropriate;Frequency remains appropriate    Co-evaluation                 AM-PAC OT "6 Clicks" Daily Activity     Outcome Measure   Help from another person eating meals?: None Help from another person taking care of personal grooming?: A Little Help from another person toileting, which includes using toliet, bedpan, or urinal?: A Little Help from another person bathing (including washing,  rinsing, drying)?: A Little Help from another person to put on and taking off regular upper body clothing?: A Little Help from another person to put on and taking off regular lower body clothing?: A Little 6 Click Score: 19    End of Session Equipment Utilized During Treatment: Oxygen;Gait belt;Rolling walker  OT Visit Diagnosis: Unsteadiness on feet (R26.81);Muscle weakness (generalized) (M62.81)   Activity Tolerance Patient tolerated treatment well   Patient Left in chair;with call bell/phone within reach   Nurse Communication Mobility status;Other (comment) (Response to treatment)        Time: 2767-0110 OT Time Calculation (min): 15 min  Charges: OT General Charges $OT Visit: 1 Visit OT Treatments $Therapeutic Activity: 8-22 mins  Jahsir Rama H. OTR/L Supplemental OT, Department of rehab services [phone]   Olive Motyka R H. 04/02/2021, 12:05 PM

## 2021-04-02 NOTE — Progress Notes (Signed)
Progress Note  Patient Name: Lori Bean Date of Encounter: 04/02/2021  [facility] Cardiologist: [name], MD   Subjective   Patient states she is doing well, denied any chest pain, SOB, able to tolerate ambulation to bathroom without issues. She states she has chroni lymphedema of her legs.   Inpatient Medications    Scheduled Meds:  aspirin  81 mg Oral Daily   atorvastatin  80 mg Oral Daily   budesonide (PULMICORT) nebulizer solution  0.25 mg Nebulization BID   DULoxetine  30 mg Oral BID   enoxaparin (LOVENOX) injection  60 mg Subcutaneous Q24H   gabapentin  300 mg Oral TID   insulin aspart  0-15 Units Subcutaneous TID AC & HS   irbesartan  75 mg Oral Daily   montelukast  10 mg Oral QHS   nicotine  7 mg Transdermal Daily   traZODone  50 mg Oral QHS   triamterene-hydrochlorothiazide  1 tablet Oral Daily   vitamin B-12  500 mcg Oral Daily   Continuous Infusions:  sodium chloride Stopped (03/31/21 1749)   cefTRIAXone (ROCEPHIN)  IV 2 g (04/01/21 1831)   PRN Meds: sodium chloride, acetaminophen, docusate sodium, hydrALAZINE, ipratropium-albuterol   Vital Signs    Vitals:   04/01/21 2155 04/02/21 0544 04/02/21 0806 04/02/21 0907  BP:  (!) 153/78  (!) 157/80  Pulse: 69 83  81  Resp: 20 20  19   Temp:  98.1 F (36.7 C)  97.9 F (36.6 C)  TempSrc:  Oral  Oral  SpO2: 99% 97% 100% 96%  Weight:  126.6 kg    Height:        Intake/Output Summary (Last 24 hours) at 04/02/2021 1004 Last data filed at 04/02/2021 0800 Gross per 24 hour  Intake 700 ml  Output 500 ml  Net 200 ml   Last 3 Weights 04/02/2021 04/01/2021 03/31/2021  Weight (lbs) 279 lb 1.6 oz 281 lb 1.4 oz 276 lb 10.8 oz  Weight (kg) 126.6 kg 127.5 kg 125.5 kg      Telemetry    Sinus rhythm at 60s, artifacts  - Personally Reviewed  ECG    N/A this AM - Personally Reviewed  Physical Exam   GEN: No acute distress. Obese.  Neck: No JVD Cardiac: RRR, no murmurs, rubs, or gallops.   Respiratory: Clear to auscultation bilaterally. On 2LNC. Speaks full sentence  GI: Soft, nontender, non-distended  MS: Lymphedema of BLE,  No deformity. Neuro:  Nonfocal  Psych: Normal affect   Labs    High Sensitivity Troponin:   Recent Labs  Lab 03/12/21 1822 03/28/21 0653 03/28/21 2315 03/29/21 0608 03/29/21 1021  TROPONINIHS 4 44* 1,735* 913* 576*      Chemistry Recent Labs  Lab 03/28/21 1017 03/28/21 0655 03/28/21 2315 03/29/21 0523 03/31/21 0407 04/01/21 0334 04/02/21 0548  NA 136   < > 140   < > 139 140 138  K 5.1   < > 3.5   < > 3.4* 4.1 3.9  CL 101   < > 105   < > 109 110 103  CO2 16*  --  26   < > 23 27 28   GLUCOSE 341*   < > 131*   < > 122* 133* 128*  BUN 13   < > 13   < > 8 9 8   CREATININE 1.21*   < > 1.00   < > 0.66 0.59 0.58  CALCIUM 9.1  --  9.0   < > 8.7*  8.8* 9.2  PROT 6.3*  --  6.4*  --   --   --  5.8*  ALBUMIN 3.2*  --  3.1*  --   --   --  2.8*  AST 102*  --  99*  --   --   --  19  ALT 96*  --  157*  --   --   --  54*  ALKPHOS 80  --  71  --   --   --  52  BILITOT 0.5  --  0.6  --   --   --  0.5  GFRNONAA 54*  --  >60   < > >60 >60 >60  ANIONGAP 19*  --  9   < > 7 3* 7   < > = values in this interval not displayed.     Hematology Recent Labs  Lab 03/31/21 0407 04/01/21 0334 04/02/21 0548  WBC 9.7 7.7 9.1  RBC 3.52* 3.43* 3.62*  HGB 11.2* 10.9* 11.4*  HCT 34.8* 33.9* 35.0*  MCV 98.9 98.8 96.7  MCH 31.8 31.8 31.5  MCHC 32.2 32.2 32.6  RDW 14.2 14.4 14.1  PLT 167 168 175    BNP Recent Labs  Lab 03/28/21 0653  BNP 43.4     DDimer No results for input(s): DDIMER in the last 168 hours.   Radiology    CT CORONARY MORPH W/CTA COR W/SCORE W/CA W/CM &/OR WO/CM  Addendum Date: 04/01/2021   ADDENDUM REPORT: 04/01/2021 18:13 ADDENDUM: These results were called by telephone at the time of interpretation on 04/01/2021 at 6:13 pm to provider [facility] , who verbally acknowledged these results. Electronically Signed   By: Markus Daft M.D.   On: 04/01/2021 18:13   Addendum Date: 04/01/2021   ADDENDUM REPORT: 04/01/2021 18:04 EXAM: OVER-READ INTERPRETATION  CT CHEST The following report is an over-read performed by radiologist Dr. Estanislado Pandy [facility] on 04/01/2021. This over-read does not include interpretation of cardiac or coronary anatomy or pathology. The coronary CTA interpretation by the cardiologist is attached. COMPARISON:  Chest CT 03/29/2021 FINDINGS: Questionable filling defect involving a right lower lobe pulmonary artery on sequence 11, image 19. This does not confidently represent pulmonary emboli due to artifact in this area. There is another questionable area in the right lower lobe on image 22 along the medial aspect. Main pulmonary arteries appear to be patent. Visualized mediastinal structures are unremarkable. Imaging of the upper abdomen demonstrates an enlarged left hepatic lobe. Suspect hepatic steatosis. Again noted is mild fullness of the left adrenal gland that is incompletely evaluated. Again noted is volume loss in the medial lower lobes. No significant pleural fluid. Study has limitations from motion artifact. Bridging osteophytes in the visualized thoracic spine. IMPRESSION: 1. Potential filling defects involving pulmonary arteries in the right lower lobe. However, this area has limited evaluation due to motion artifact and poor opacification of the pulmonary arteries. This finding is NOT definitive and recommend a dedicated Chest CTA to evaluate for pulmonary embolism. 2. Persistent volume loss in the medial lower lobes. 3. Enlargement of left hepatic lobe with low-density. Findings are concerning for hepatic steatosis and cirrhosis. Electronically Signed   By: Markus Daft M.D.   On: 04/01/2021 18:04   Result Date: 04/01/2021 EXAM: Cardiac/Coronary  CT TECHNIQUE: The patient was scanned on a [employer]. FINDINGS: A 120 kV prospective scan was triggered in the descending thoracic  aorta at 111 HU's. Axial non-contrast 3 mm slices were  carried out through the heart. The data set was analyzed on a dedicated work station and scored using the [employer]. Gantry rotation speed was 250 msecs and collimation was .6 mm. No beta blockade and 0.8 mg of sl NTG was given. The 3D data set was reconstructed in 5% intervals of the 67-82 % of the R-R cycle. Diastolic phases were analyzed on a dedicated work station using MPR, MIP and VRT modes. The patient received 80 cc of contrast. Study Quality poor and limited by Noise and stitch artifact. Aorta:  Normal size.  No calcifications.  No dissection. Aortic Valve:  Trileaflet.  No calcifications. Coronary Arteries:  Normal coronary origin.  Right dominance. RCA is a large dominant artery that gives rise to PDA and PLVB. There is no plaque. Left main is a large artery that gives rise to LAD and LCX arteries. There is no plaque. LAD is a large vessel that gives rise to 2 diagonals. Cannot rule out obstructive disease in the distal LAD which is not adequately visualized. No obvious calcified plaque. LCX is a non-dominant artery that gives rise to one large OM1 branch. Possible high grade non calcified plaque in the mid LCx with associated stenosis of 70-99% but may be due to stitch artifact. Other findings: Normal pulmonary vein drainage into the left atrium. Normal let atrial appendage without a thrombus. Normal size of the pulmonary artery. IMPRESSION: 1. Coronary calcium score of 0. This was 0 percentile for age and sex matched control. 2.  Normal coronary origin with right dominance. 3. No evidence of calcified plaque. Poor quality study for non calcified plaque with possible high grade soft plaque in the mid LCx. The distal LAD is not visualized. 4. The study was not submitted for FFR analysis due to poor quality and significant stitch artifact. 5. Recommend cardiac cath or nuclear stress testing if clinical suspicion high. Fransico Him Electronically  Signed: By: Fransico Him On: 04/01/2021 16:13    Cardiac Studies   CT coronary study 04/01/21:  IMPRESSION: 1. Coronary calcium score of 0. This was 0 percentile for age and sex matched control.   2.  Normal coronary origin with right dominance.   3. No evidence of calcified plaque. Poor quality study for non calcified plaque with possible high grade soft plaque in the mid LCx. The distal LAD is not visualized.   4. The study was not submitted for FFR analysis due to poor quality and significant stitch artifact.   5. Recommend cardiac cath or nuclear stress testing if clinical suspicion high.    Echo from 03/29/21:   1. Left ventricular ejection fraction, by estimation, is 60 to 65%. The  left ventricle has normal function. The left ventricle has no regional  wall motion abnormalities. Left ventricular diastolic parameters were  normal.   2. Right ventricular systolic function is normal. The right ventricular  size is normal.   3. The mitral valve is normal in structure. No evidence of mitral valve  regurgitation. No evidence of mitral stenosis.   4. The aortic valve is normal in structure. Aortic valve regurgitation is  not visualized. No aortic stenosis is present.   5. The inferior vena cava is normal in size with greater than 50%  respiratory variability, suggesting right atrial pressure of 3 mmHg.   Comparison(s): Prior images unable to be directly viewed, comparison made  by report only.   Patient Profile     50 y.o. female with chronic diastolic heart failure, hypertensive heart  disease, asthma, OSA on CPAP, lymphedema, schizophrenia, bipolar disorder, obesity and prior PE admitted with hypercarbic respiratory failure requiring intubation.  Cardiology following for elevated troponin.  Assessment & Plan    Elevated troponin  - admitted with hypercarbic respiratory failure requiring intubation.   - only episode of chest pain she recalls is having some chest  discomfort after eating spicy hot wings, denies any discomfort with exertion, denied any chest pain during this admission - HS troponin peaked at 1735, now 576  - EKG without acute ischemic changes.   - s/p heparin gtt for 48 hours - CT coronary study 04/01/21 is poor study not sent for FFR analysis, coronary calcium score of 0, No evidence of calcified plaque (CTA rule out PE pending report)  - Trop elevation likely demand ischemia, no further ischemic workup  - discussed lifestyle modification such as exercise, weight loss, heart healthy diet, optimal BP control, and smoking cessation for primary prevention   Chronic diastolic heart failure - euvolemic on exam - Echo 7/16 with EF 60-65%, no RWMA, diastolic parameters normal, no significant valvular disease  - resumed HCTZ-triamterene, continue optimal BP control, low Na diet    Hyperlipidemia:  - Continue atorvastatin., LDL 66   HTN - resumed home meds HCTZ-triamterene, valsartan; may add back metoprolol if no further athma exacerbation, BP improving   Acute hypoxic respiratory failure  ? Pneumonia  Type 2 DM OSA Schizophrenia/Bipolar  Lymphedema Polypharmacy  Asthma  - managed per IM    For questions or updates, please contact [facility] Please consult [email or web address] for contact info under        Signed, Margie Billet, NP  04/02/2021, 10:04 AM

## 2021-04-02 NOTE — Plan of Care (Signed)
  Problem: Education: Goal: Knowledge of General Education information will improve Description: Including pain rating scale, medication(s)/side effects and non-pharmacologic comfort measures Outcome: Progressing   Problem: Clinical Measurements: Goal: Ability to maintain clinical measurements within normal limits will improve Outcome: Progressing   Problem: Activity: Goal: Risk for activity intolerance will decrease Outcome: Progressing   Problem: Nutrition: Goal: Adequate nutrition will be maintained Outcome: Progressing   Problem: Safety: Goal: Ability to remain free from injury will improve Outcome: Progressing   

## 2021-04-02 NOTE — Progress Notes (Signed)
PROGRESS NOTE    Lori Bean  INO:[id number] DOB: November 02, 1970 DOA: 03/28/2021 PCP: [name], PA   Brief Narrative:  50 year old with past medical history significant for asthma, OSA, bipolar, schizophrenia, who presented on 7/15 with altered mental status.  EMS was called and patient was unresponsive, hypotensive and in respiratory distress.  Patient received Narcan, breathing was assisted with BVM.  In the ED she was found to be hypertensive,  she was intubated and sedated.   CT head negative, ABG showed pH of 7.1 PCO2 70.  Ammonia and 189.   Significant events: 7/15; patient was admitted to ICU for acute metabolic encephalopathy, intubated, CT head negative. 7/17; Patient was extubated, MRI was negative. 7/19; Patient care transferred to [facility] 7/20: Coronary CTA --> pending  Assessment & Plan:   Active Problems:   Acute metabolic encephalopathy  Acute Hypercapnic and Hypoxic Respiratory Failure requiring intubation and mechanical ventilation, resolved: -History of asthma, OSA -Smoker -Patient was intubated on admission 7/15----extubated 7/17. -on IV ceftriaxone treating for PNA>  -On Pulmicort, nebulizer, singular. -resume home CPAP nightly as and as needed  Elevation of troponin: Non-STEMI versus demand ischemia ECHO; EF 60 %, normal Diastolic parameters. Troponin Peaked at 1735---[phone]---576 Received heparin drip for 48 hours. Cardiology planning coronary CT -poor study  Acute Metabolic Encephalopathy, resolved  Secondary to acute hypercapnic respiratory failure, elevation of ammonia Resolved. Alert and oriented.    Elevated liver enzymes - likely secondary to above Elevated ammonia resolved, liver enzymes downtrending appropriately   Hypertriglyceridemia;  Lipid panel within normal limits, continue Lipitor   Diabetes type 2 hyperglycemia: A1c 7.2 SSI On glipizide and metformin as out patient. Holding while inpatient during cardiac testing and  contrast studies   Hypertension: Malignant hypertension On Avapro. Maxzide resume today. PRN hydralazine.    History of bipolar, schizophrenia: Continue Cymbalta and gabapentin.    DVT prophylaxis: Lovenox Code Status: Full Family Communication: None present  Status is: Inpatient  Dispo: The patient is from: Home              Anticipated d/c is to: Home              Anticipated d/c date is: 24 to 48 hours pending clinical course cardiology work-up              Patient currently not medically stable for discharge  Consultants:  Cardiology  Antimicrobials:  None indicated  Subjective: No acute issues or events overnight, denies nausea vomiting diarrhea constipation headache fevers or chills.  Objective: Vitals:   04/01/21 2037 04/01/21 2113 04/01/21 2155 04/02/21 0544  BP:  (!) 162/87  (!) 153/78  Pulse:  75 69 83  Resp:  20 20 20   Temp:  97.6 F (36.4 C)  98.1 F (36.7 C)  TempSrc:  Oral  Oral  SpO2: 98% 99% 99% 97%  Weight:      Height:       No intake or output data in the 24 hours ending 04/02/21 0800 Filed Weights   03/30/21 0500 03/31/21 0415 04/01/21 0500  Weight: 125.5 kg 125.5 kg 127.5 kg    Examination:  General:  Pleasantly resting in bed, No acute distress. HEENT:  Normocephalic atraumatic.  Sclerae nonicteric, noninjected.  Extraocular movements intact bilaterally. Neck:  Without mass or deformity.  Trachea is midline. Lungs:  Clear to auscultate bilaterally without rhonchi, wheeze, or rales. Heart:  Regular rate and rhythm.  Without murmurs, rubs, or gallops. Abdomen:  Soft, nontender, nondistended.  Without  guarding or rebound. Extremities: Without cyanosis, clubbing, lymphedema noted Vascular:  Dorsalis pedis and posterior tibial pulses palpable bilaterally. Skin:  Warm and dry, no erythema, no ulcerations.  Data Reviewed: I have personally reviewed following labs and imaging studies  CBC: Recent Labs  Lab 03/28/21 0653 03/28/21 0655  03/28/21 2315 03/29/21 0523 03/29/21 1015 03/30/21 0349 03/31/21 0407 04/01/21 0334 04/02/21 0548  WBC 26.2*  --  9.9  --   --  11.9* 9.7 7.7 9.1  NEUTROABS 10.5*  --   --   --   --   --   --   --   --   HGB 13.9   < > 13.4   < > 12.9 11.6* 11.2* 10.9* 11.4*  HCT 45.7   < > 40.6   < > 38.0 35.8* 34.8* 33.9* 35.0*  MCV 105.5*  --  99.5  --   --  100.6* 98.9 98.8 96.7  PLT 250  --  188  --   --  147* 167 168 175   < > = values in this interval not displayed.   Basic Metabolic Panel: Recent Labs  Lab 03/28/21 2315 03/29/21 0523 03/29/21 1015 03/30/21 0349 03/31/21 0407 04/01/21 0334 04/02/21 0548  NA 140   < > 141 138 139 140 138  K 3.5   < > 3.3* 3.9 3.4* 4.1 3.9  CL 105  --   --  110 109 110 103  CO2 26  --   --  23 23 27 28   GLUCOSE 131*  --   --  184* 122* 133* 128*  BUN 13  --   --  7 8 9 8   CREATININE 1.00  --   --  0.64 0.66 0.59 0.58  CALCIUM 9.0  --   --  8.6* 8.7* 8.8* 9.2  MG 2.1  --   --   --   --   --   --    < > = values in this interval not displayed.   GFR: Estimated Creatinine Clearance: 106.5 mL/min (by C-G formula based on SCr of 0.58 mg/dL). Liver Function Tests: Recent Labs  Lab 03/28/21 0653 03/28/21 2315 04/02/21 0548  AST 102* 99* 19  ALT 96* 157* 54*  ALKPHOS 80 71 52  BILITOT 0.5 0.6 0.5  PROT 6.3* 6.4* 5.8*  ALBUMIN 3.2* 3.1* 2.8*   No results for input(s): LIPASE, AMYLASE in the last 168 hours. Recent Labs  Lab 03/28/21 0653 03/28/21 2315  AMMONIA 189* 30   Coagulation Profile: Recent Labs  Lab 03/28/21 2315  INR 1.0   Cardiac Enzymes: No results for input(s): CKTOTAL, CKMB, CKMBINDEX, TROPONINI in the last 168 hours. BNP (last 3 results) Recent Labs    06/21/20 1004  PROBNP 16.0   HbA1C: No results for input(s): HGBA1C in the last 72 hours. CBG: Recent Labs  Lab 03/31/21 2152 04/01/21 0739 04/01/21 1301 04/01/21 2108 04/02/21 0742  GLUCAP 135* 114* 121* 141* 162*   Lipid Profile: Recent Labs     04/02/21 0548  CHOL 141  HDL 48  LDLCALC 66  TRIG 135  CHOLHDL 2.9   Thyroid Function Tests: No results for input(s): TSH, T4TOTAL, FREET4, T3FREE, THYROIDAB in the last 72 hours. Anemia Panel: No results for input(s): VITAMINB12, FOLATE, FERRITIN, TIBC, IRON, RETICCTPCT in the last 72 hours. Sepsis Labs: Recent Labs  Lab 03/28/21 0653 03/28/21 2315  LATICACIDVEN >11.0* 1.4    Recent Results (from the past 240 hour(s))  Resp Panel by  RT-PCR (Flu A&B, Covid) Nasopharyngeal Swab     Status: None   Collection Time: 03/28/21  7:19 AM   Specimen: Nasopharyngeal Swab; Nasopharyngeal(NP) swabs in vial transport medium  Result Value Ref Range Status   SARS Coronavirus 2 by RT PCR NEGATIVE NEGATIVE Final    Comment: (NOTE) SARS-CoV-2 target nucleic acids are NOT DETECTED.  The SARS-CoV-2 RNA is generally detectable in upper respiratory specimens during the acute phase of infection. The lowest concentration of SARS-CoV-2 viral copies this assay can detect is 138 copies/mL. A negative result does not preclude SARS-Cov-2 infection and should not be used as the sole basis for treatment or other patient management decisions. A negative result may occur with  improper specimen collection/handling, submission of specimen other than nasopharyngeal swab, presence of viral mutation(s) within the areas targeted by this assay, and inadequate number of viral copies(<138 copies/mL). A negative result must be combined with clinical observations, patient history, and epidemiological information. The expected result is Negative.  Fact Sheet for Patients:  EntrepreneurPulse.com.au  Fact Sheet for Healthcare Providers:  IncredibleEmployment.be  This test is no t yet approved or cleared by the [place] FDA and  has been authorized for detection and/or diagnosis of SARS-CoV-2 by FDA under an Emergency Use Authorization (EUA). This EUA will remain  in  effect (meaning this test can be used) for the duration of the COVID-19 declaration under Section 564(b)(1) of the Act, 21 U.S.C.section 360bbb-3(b)(1), unless the authorization is terminated  or revoked sooner.       Influenza A by PCR NEGATIVE NEGATIVE Final   Influenza B by PCR NEGATIVE NEGATIVE Final    Comment: (NOTE) The Xpert Xpress SARS-CoV-2/FLU/RSV plus assay is intended as an aid in the diagnosis of influenza from Nasopharyngeal swab specimens and should not be used as a sole basis for treatment. Nasal washings and aspirates are unacceptable for Xpert Xpress SARS-CoV-2/FLU/RSV testing.  Fact Sheet for Patients: EntrepreneurPulse.com.au  Fact Sheet for Healthcare Providers: IncredibleEmployment.be  This test is not yet approved or cleared by the [place] FDA and has been authorized for detection and/or diagnosis of SARS-CoV-2 by FDA under an Emergency Use Authorization (EUA). This EUA will remain in effect (meaning this test can be used) for the duration of the COVID-19 declaration under Section 564(b)(1) of the Act, 21 U.S.C. section 360bbb-3(b)(1), unless the authorization is terminated or revoked.  Performed at [facility], [place] [address], [place], [place] [address]   MRSA Next Gen by PCR, Nasal     Status: None   Collection Time: 03/28/21  3:29 PM   Specimen: Nasal Mucosa; Nasal Swab  Result Value Ref Range Status   MRSA by PCR Next Gen NOT DETECTED NOT DETECTED Final    Comment: (NOTE) The GeneXpert MRSA Assay (FDA approved for NASAL specimens only), is one component of a comprehensive MRSA colonization surveillance program. It is not intended to diagnose MRSA infection nor to guide or monitor treatment for MRSA infections. Test performance is not FDA approved in patients less than 12 years old. Performed at [facility], [place] [address], [place], [place] [address]   Blood culture (routine x 2)      Status: None   Collection Time: 03/28/21  8:50 PM   Specimen: BLOOD LEFT HAND  Result Value Ref Range Status   Specimen Description BLOOD LEFT HAND  Final   Special Requests   Final    BOTTLES DRAWN AEROBIC ONLY Blood Culture adequate volume   Culture  Final    NO GROWTH 5 DAYS Performed at [facility], [place] [address] [place], [place] [address]    Report Status 04/02/2021 FINAL  Final  Blood culture (routine x 2)     Status: None (Preliminary result)   Collection Time: 03/28/21 11:15 PM   Specimen: BLOOD  Result Value Ref Range Status   Specimen Description BLOOD SITE NOT SPECIFIED  Final   Special Requests   Final    BOTTLES DRAWN AEROBIC AND ANAEROBIC Blood Culture adequate volume   Culture   Final    NO GROWTH 4 DAYS Performed at [facility], [place] [address] [place], [place] [address]    Report Status PENDING  Incomplete         Radiology Studies: CT CORONARY MORPH W/CTA COR W/SCORE W/CA W/CM &/OR WO/CM  Addendum Date: 04/01/2021   ADDENDUM REPORT: 04/01/2021 18:13 ADDENDUM: These results were called by telephone at the time of interpretation on 04/01/2021 at 6:13 pm to provider [facility] [place] , who verbally acknowledged these results. Electronically Signed   By: Markus Daft M.D.   On: 04/01/2021 18:13   Addendum Date: 04/01/2021   ADDENDUM REPORT: 04/01/2021 18:04 EXAM: OVER-READ INTERPRETATION  CT CHEST The following report is an over-read performed by radiologist Dr. Estanislado Pandy [facility] on 04/01/2021. This over-read does not include interpretation of cardiac or coronary anatomy or pathology. The coronary CTA interpretation by the cardiologist is attached. COMPARISON:  Chest CT 03/29/2021 FINDINGS: Questionable filling defect involving a right lower lobe pulmonary artery on sequence 11, image 19. This does not confidently represent pulmonary emboli due to artifact in this area. There is another questionable area in the right lower lobe on image  22 along the medial aspect. Main pulmonary arteries appear to be patent. Visualized mediastinal structures are unremarkable. Imaging of the upper abdomen demonstrates an enlarged left hepatic lobe. Suspect hepatic steatosis. Again noted is mild fullness of the left adrenal gland that is incompletely evaluated. Again noted is volume loss in the medial lower lobes. No significant pleural fluid. Study has limitations from motion artifact. Bridging osteophytes in the visualized thoracic spine. IMPRESSION: 1. Potential filling defects involving pulmonary arteries in the right lower lobe. However, this area has limited evaluation due to motion artifact and poor opacification of the pulmonary arteries. This finding is NOT definitive and recommend a dedicated Chest CTA to evaluate for pulmonary embolism. 2. Persistent volume loss in the medial lower lobes. 3. Enlargement of left hepatic lobe with low-density. Findings are concerning for hepatic steatosis and cirrhosis. Electronically Signed   By: Markus Daft M.D.   On: 04/01/2021 18:04   Result Date: 04/01/2021 EXAM: Cardiac/Coronary  CT TECHNIQUE: The patient was scanned on a [employer]. FINDINGS: A 120 kV prospective scan was triggered in the descending thoracic aorta at 111 HU's. Axial non-contrast 3 mm slices were carried out through the heart. The data set was analyzed on a dedicated work station and scored using the [place]. Gantry rotation speed was 250 msecs and collimation was .6 mm. No beta blockade and 0.8 mg of sl NTG was given. The 3D data set was reconstructed in 5% intervals of the 67-82 % of the R-R cycle. Diastolic phases were analyzed on a dedicated work station using MPR, MIP and VRT modes. The patient received 80 cc of contrast. Study Quality poor and limited by Noise and stitch artifact. Aorta:  Normal size.  No calcifications.  No dissection. Aortic Valve:  Trileaflet.  No calcifications. Coronary Arteries:  Normal coronary origin.   Right dominance. RCA is a large dominant artery that gives rise to PDA and PLVB. There is no plaque. Left main is a large artery that gives rise to LAD and LCX arteries. There is no plaque. LAD is a large vessel that gives rise to 2 diagonals. Cannot rule out obstructive disease in the distal LAD which is not adequately visualized. No obvious calcified plaque. LCX is a non-dominant artery that gives rise to one large OM1 branch. Possible high grade non calcified plaque in the mid LCx with associated stenosis of 70-99% but may be due to stitch artifact. Other findings: Normal pulmonary vein drainage into the left atrium. Normal let atrial appendage without a thrombus. Normal size of the pulmonary artery. IMPRESSION: 1. Coronary calcium score of 0. This was 0 percentile for age and sex matched control. 2.  Normal coronary origin with right dominance. 3. No evidence of calcified plaque. Poor quality study for non calcified plaque with possible high grade soft plaque in the mid LCx. The distal LAD is not visualized. 4. The study was not submitted for FFR analysis due to poor quality and significant stitch artifact. 5. Recommend cardiac cath or nuclear stress testing if clinical suspicion high. Traci Turner Electronically Signed: By: Fransico Him On: 04/01/2021 16:13        Scheduled Meds:  aspirin  81 mg Oral Daily   atorvastatin  80 mg Oral Daily   budesonide (PULMICORT) nebulizer solution  0.25 mg Nebulization BID   Chlorhexidine Gluconate Cloth  6 each Topical Daily   DULoxetine  30 mg Oral BID   enoxaparin (LOVENOX) injection  60 mg Subcutaneous Q24H   gabapentin  300 mg Oral TID   insulin aspart  0-15 Units Subcutaneous TID AC & HS   irbesartan  75 mg Oral Daily   mouth rinse  15 mL Mouth Rinse BID   montelukast  10 mg Oral QHS   nicotine  7 mg Transdermal Daily   traZODone  50 mg Oral QHS   triamterene-hydrochlorothiazide  1 tablet Oral Daily   vitamin B-12  500 mcg Oral Daily   Continuous  Infusions:  sodium chloride Stopped (03/31/21 1749)   cefTRIAXone (ROCEPHIN)  IV 2 g (04/01/21 1831)     LOS: 5 days   Time spent: 96min  Maurio Baize C Alessa Mazur, DO [facility]  If 7PM-7AM, please contact night-coverage [email or web address]  04/02/2021, 8:00 AM

## 2021-04-03 LAB — CBC
HCT: 34.5 % — ABNORMAL LOW (ref 36.0–46.0)
Hemoglobin: 11.8 g/dL — ABNORMAL LOW (ref 12.0–15.0)
MCH: 32.5 pg (ref 26.0–34.0)
MCHC: 34.2 g/dL (ref 30.0–36.0)
MCV: 95 fL (ref 80.0–100.0)
Platelets: 175 10*3/uL (ref 150–400)
RBC: 3.63 MIL/uL — ABNORMAL LOW (ref 3.87–5.11)
RDW: 13.8 % (ref 11.5–15.5)
WBC: 8.8 10*3/uL (ref 4.0–10.5)
nRBC: 0 % (ref 0.0–0.2)

## 2021-04-03 LAB — CULTURE, BLOOD (ROUTINE X 2)
Culture: NO GROWTH
Special Requests: ADEQUATE

## 2021-04-03 LAB — GLUCOSE, CAPILLARY
Glucose-Capillary: 138 mg/dL — ABNORMAL HIGH (ref 70–99)
Glucose-Capillary: 113 mg/dL — ABNORMAL HIGH (ref 70–99)

## 2021-04-03 MED ORDER — ATORVASTATIN CALCIUM 80 MG PO TABS: 80.0000 mg | ORAL_TABLET | Freq: Every day | ORAL | 0 refills | Status: DC

## 2021-04-03 MED ORDER — ASPIRIN 81 MG PO CHEW
81.0000 mg | CHEWABLE_TABLET | Freq: Every day | ORAL | 0 refills | Status: DC
Start: 2021-04-03 — End: 2022-08-26

## 2021-04-03 NOTE — Progress Notes (Deleted)
Physical Therapy Treatment Patient Details Name: Lori Bean MRN: [id number] DOB: April 24, 1971 Today's Date: 04/03/2021    History of Present Illness 49 y/o female admitted 7/15 with acute metabolic encephalopathy following an asthma exacerbation. Intubated 7/15-7/17 PMH: OSA, asthma, diastolic HF, M3NT, anxiety, bipolar disorder, and schizophrenia.    PT Comments    Pt up in chair, brother in room. Pt eager to ambulate with therapy. Pt limited in safe mobility by decrease knowledge of deficits in presence of generalized weakness. Pt mod I for transfers and min guard for ambulation with RW. Pt unable to gage his abilities, with increasing L foot drag causing minor LoB. With seated rest break able to continue gait safely. Discussed use of Rollator for safety. Pt in agreement. D/c plan remains appropriate. PT will continue to follow acutely.      Follow Up Recommendations  Home health PT;Supervision - Intermittent     Equipment Recommendations  Other (comment) (rollator)       Precautions / Restrictions Precautions Precautions: Fall Restrictions Weight Bearing Restrictions: No    Mobility  Bed Mobility               General bed mobility comments: sitting up in recliner on entry    Transfers Overall transfer level: Needs assistance Equipment used: 4-wheeled walker Transfers: Sit to/from Stand Sit to Stand: Modified independent (Device/Increase time)         General transfer comment: increased effort  Ambulation/Gait Ambulation/Gait assistance: Supervision Gait Distance (Feet): 60 Feet (x2) Assistive device: 4-wheeled walker Gait Pattern/deviations: Step-through pattern;Decreased stride length;Trunk flexed;Wide base of support Gait velocity: decr Gait velocity interpretation: <1.31 ft/sec, indicative of household ambulator General Gait Details: supervision for safety with use of new Rollator, vc for proper break usage and performing seated rest  break,       Balance Overall balance assessment: Needs assistance Sitting-balance support: No upper extremity supported;Feet supported Sitting balance-Leahy Scale: Good     Standing balance support: During functional activity;No upper extremity supported Standing balance-Leahy Scale: Fair Standing balance comment: Static standing without support. UE support for dynamic activities                            Cognition Arousal/Alertness: Awake/alert Behavior During Therapy: WFL for tasks assessed/performed Overall Cognitive Status: Within Functional Limits for tasks assessed                                           General Comments General comments (skin integrity, edema, etc.): Ambulated on RA      Pertinent Vitals/Pain Pain Assessment: No/denies pain     PT Goals (current goals can now be found in the care plan section) Acute Rehab PT Goals Patient Stated Goal: To return home. PT Goal Formulation: With patient Time For Goal Achievement: 04/15/21 Potential to Achieve Goals: Good Progress towards PT goals: Progressing toward goals    Frequency    Min 3X/week      PT Plan Current plan remains appropriate;Equipment recommendations need to be updated       AM-PAC PT "6 Clicks" Mobility   Outcome Measure  Help needed turning from your back to your side while in a flat bed without using bedrails?: None Help needed moving from lying on your back to sitting on the side of a flat bed without using bedrails?: A Little Help  needed moving to and from a bed to a chair (including a wheelchair)?: A Little Help needed standing up from a chair using your arms (e.g., wheelchair or bedside chair)?: A Little Help needed to walk in hospital room?: A Little Help needed climbing 3-5 steps with a railing? : A Little 6 Click Score: 19    End of Session Equipment Utilized During Treatment: Gait belt Activity Tolerance: Patient tolerated treatment  well Patient left: in chair;with call bell/phone within reach Nurse Communication: Mobility status PT Visit Diagnosis: Other abnormalities of gait and mobility (R26.89);Muscle weakness (generalized) (M62.81);Unsteadiness on feet (R26.81)     Time: 1250-1318 PT Time Calculation (min) (ACUTE ONLY): 28 min  Charges:  $Gait Training: 23-37 mins                     Deshana Rominger B. Migdalia Dk PT, DPT Acute Rehabilitation Services Pager [phone] Office [phone]    [place] 04/03/2021, 1:24 PM

## 2021-04-03 NOTE — Progress Notes (Addendum)
SATURATION QUALIFICATIONS: (This note is used to comply with regulatory documentation for home oxygen)  Patient Saturations on Room Air at Rest = 98%  Patient Saturations on Room Air while Ambulating = 95%    Please briefly explain why patient needs home oxygen:  No need for home O2

## 2021-04-03 NOTE — Discharge Summary (Signed)
Physician Discharge Summary  Lori Bean SPQ:[id number] DOB: [birth year] DOA: 03/28/2021  PCP: [name], PA  Admit date: 03/28/2021 Discharge date: 04/03/2021  Admitted From: Home Disposition: Home  Recommendations for Outpatient Follow-up:  Follow up with PCP in 1-2 weeks Please obtain BMP/CBC in one week Please follow up with pulmonology as scheduled  Home Health: PT Equipment/Devices: None  Discharge Condition: Stable CODE STATUS: Full Diet recommendation: Low-salt low-fat low-carb diet  Brief/Interim Summary: 50 year old with past medical history significant for asthma, OSA, bipolar, schizophrenia, who presented on 7/15 with altered mental status.  EMS was called and patient was unresponsive, hypotensive and in respiratory distress.  Patient received Narcan, breathing was assisted with BVM.  In the ED she was found to be hypertensive,  she was intubated and sedated.    Significant events: 7/15; patient was admitted to ICU for acute metabolic encephalopathy, intubated, CT head negative. 7/17; Patient was extubated, MRI was negative. 7/19; Patient care transferred to [facility] 7/20: Coronary CTA --> unremarkable per cardiology  Acute Hypercapnic and Hypoxic Respiratory Failure requiring intubation and mechanical ventilation, resolved: -History of asthma, OSA -Smoker -Patient was intubated on admission 7/15----extubated 7/17 -ambulating now without hypoxia -Completed ceftriaxone treating for PNA>  -On Pulmicort, nebulizer, singular. -resume home CPAP nightly as and as needed   Elevation of troponin: Non-STEMI versus demand ischemia ECHO; EF 60 %, normal Diastolic parameters. Troponin Peaked at 1735---913---576 Received heparin drip for 48 hours. Cardiology planning coronary CT -poor study but unremarkable for any acute findings, further outpatient work-up per their expertise and schedule   Acute Metabolic Encephalopathy, resolved  Secondary to acute hypercapnic  respiratory failure, elevation of ammonia Resolved. Alert and oriented x4, back to baseline.    Elevated liver enzymes - likely secondary to above Elevated ammonia resolved, liver enzymes downtrending appropriately -likely secondary to hypoxia   Hypertriglyceridemia;  Lipid panel within normal limits, continue Lipitor   Diabetes type 2 hyperglycemia: A1c 7.2 SSI Resume home p.o. medications including metformin, glipizide, Trulicity   Malignant hypertension Continue home medications including triamterene/HCTZ/valsartan  History of bipolar, schizophrenia: Continue Cymbalta and gabapentin.   Discharge Diagnoses:  Active Problems:   Acute metabolic encephalopathy   Demand ischemia [facility])    Discharge Instructions  Discharge Instructions     Call MD for:  difficulty breathing, headache or visual disturbances   Complete by: As directed    Call MD for:  persistant nausea and vomiting   Complete by: As directed    Call MD for:  severe uncontrolled pain   Complete by: As directed    Call MD for:  temperature >100.4   Complete by: As directed    Call MD for:  temperature >100.4   Complete by: As directed    Diet - low sodium heart healthy   Complete by: As directed    Increase activity slowly   Complete by: As directed    Increase activity slowly   Complete by: As directed       Allergies as of 04/03/2021       Reactions   Aspirin Nausea And Vomiting        Medication List     STOP taking these medications    Accu-Chek Guide test strip Generic drug: glucose blood   ARIPiprazole 20 MG tablet Commonly known as: ABILIFY   Bevespi Aerosphere 9-4.8 MCG/ACT Aero Generic drug: Glycopyrrolate-Formoterol   blood glucose meter kit and supplies Kit   fesoterodine 4 MG Tb24 tablet Commonly known as: [employer]  Flutter Devi   hydrocortisone 2.5 % cream   hydrOXYzine 25 MG tablet Commonly known as: ATARAX/VISTARIL   metoprolol succinate 25 MG 24 hr  tablet Commonly known as: TOPROL-XL   omeprazole 40 MG capsule Commonly known as: PRILOSEC   oxybutynin 10 MG 24 hr tablet Commonly known as: DITROPAN-XL   Oxycodone HCl 10 MG Tabs   phentermine 37.5 MG tablet Commonly known as: ADIPEX-P   solifenacin 5 MG tablet Commonly known as: VESICARE   torsemide 20 MG tablet Commonly known as: Demadex   Triamcinolone Acetonide 0.025 % Lotn   varenicline 0.5 MG tablet Commonly known as: Chantix   Vitamin D (Ergocalciferol) 1.25 MG (50000 UNIT) Caps capsule Commonly known as: DRISDOL       TAKE these medications    albuterol 108 (90 Base) MCG/ACT inhaler Commonly known as: VENTOLIN HFA Inhale 2 puffs into the lungs every 4 (four) hours as needed for wheezing or shortness of breath.   albuterol (2.5 MG/3ML) 0.083% nebulizer solution Commonly known as: PROVENTIL Take 2.5 mg by nebulization every 4 (four) hours as needed for wheezing or shortness of breath.   aspirin 81 MG chewable tablet Chew 1 tablet (81 mg total) by mouth daily.   atorvastatin 80 MG tablet Commonly known as: LIPITOR Take 1 tablet (80 mg total) by mouth daily.   Breztri Aerosphere 160-9-4.8 MCG/ACT Aero Generic drug: Budeson-Glycopyrrol-Formoterol Inhale 2 puffs into the lungs in the morning and at bedtime.   DULoxetine 30 MG capsule Commonly known as: CYMBALTA Take 30 mg by mouth 2 (two) times daily.   gabapentin 300 MG capsule Commonly known as: NEURONTIN Take 300 mg by mouth 3 (three) times daily.   glipiZIDE 10 MG 24 hr tablet Commonly known as: GLUCOTROL XL Take 10 mg by mouth daily with breakfast.   metFORMIN 1000 MG tablet Commonly known as: GLUCOPHAGE Take 1,000 mg by mouth 2 (two) times daily with a meal.   montelukast 10 MG tablet Commonly known as: SINGULAIR Take 1 tablet (10 mg total) by mouth at bedtime.   topiramate 50 MG tablet Commonly known as: TOPAMAX Take 50 mg by mouth daily.   traZODone 50 MG tablet Commonly known  as: DESYREL Take 50 mg by mouth at bedtime.   triamterene-hydrochlorothiazide 37.5-25 MG tablet Commonly known as: MAXZIDE-25 Take 1 tablet by mouth daily.   Trulicity 8.11 SR/1.5XY Sopn Generic drug: Dulaglutide Inject 0.75 mg into the skin every Sunday.   valsartan 80 MG tablet Commonly known as: DIOVAN Take 80 mg by mouth daily.               Durable Medical Equipment  (From admission, onward)           Start     Ordered   04/03/21 0952  For home use only DME Other see comment  Once       Comments: Patient needs rollator  Question:  Length of Need  Answer:  12 Months   04/03/21 [facility] Follow up on 05/07/2021.   Specialty: Cardiology Why: Please arrive at [facility] for your post hospital cardiology follow up appointment with Ms Rockne Menghini information: [place] [id number] [place], [facility] Follow up.   Specialty: Home Health Services Why: Someone from [facility] will contact you to arrange start date and  time for your therapy. Contact information: [address] [place] [address] [phone]                Allergies  Allergen Reactions   Aspirin Nausea And Vomiting    Consultations: PCCM, cardiology   Procedures/Studies: CT HEAD WO CONTRAST  Result Date: 03/28/2021 CLINICAL DATA:  Mental status change EXAM: CT HEAD WITHOUT CONTRAST TECHNIQUE: Contiguous axial images were obtained from the base of the skull through the vertex without intravenous contrast. COMPARISON:  09/20/2018 FINDINGS: Brain: There is no acute intracranial hemorrhage, mass effect, or edema. Gray-white differentiation is preserved. There is no extra-axial fluid collection. Ventricles and sulci are within normal limits in size and configuration. Vascular: No hyperdense vessel or unexpected  calcification. Skull: Calvarium is unremarkable. Sinuses/Orbits: Likely odontogenic mild right maxillary sinus mucosal thickening with posterior right maxillary molar periapical lucencies. Patchy ethmoid mucosal thickening. Nasal cavity opacification. No acute orbital abnormality. Other: Question periorbital soft tissue swelling. Mastoid air cells are clear. IMPRESSION: No acute intracranial abnormality. Question bilateral periorbital soft tissue swelling. Electronically Signed   By: Macy Mis M.D.   On: 03/28/2021 09:07   CT Angio Chest Pulmonary Embolism (PE) W or WO Contrast  Result Date: 04/02/2021 CLINICAL DATA:  High probability of pulmonary embolus. EXAM: CT ANGIOGRAPHY CHEST WITH CONTRAST TECHNIQUE: Multidetector CT imaging of the chest was performed using the standard protocol during bolus administration of intravenous contrast. Multiplanar CT image reconstructions and MIPs were obtained to evaluate the vascular anatomy. CONTRAST:  45mL OMNIPAQUE IOHEXOL 350 MG/ML SOLN COMPARISON:  August 28, 2019. FINDINGS: Cardiovascular: Satisfactory opacification of the pulmonary arteries to the segmental level. No evidence of pulmonary embolism. Mild cardiomegaly is noted. No pericardial effusion. Mediastinum/Nodes: No enlarged mediastinal, hilar, or axillary lymph nodes. Thyroid gland, trachea, and esophagus demonstrate no significant findings. Lungs/Pleura: No pneumothorax or pleural effusion is noted. Mild bilateral posterior basilar subsegmental atelectasis is noted. Upper Abdomen: Hepatic steatosis. Musculoskeletal: No chest wall abnormality. No acute or significant osseous findings. Review of the MIP images confirms the above findings. IMPRESSION: No definite evidence of pulmonary embolus. Mild bilateral posterior basilar subsegmental atelectasis. Hepatic steatosis. Electronically Signed   By: Marijo Conception M.D.   On: 04/02/2021 10:44   CT CERVICAL SPINE WO CONTRAST  Result Date:  03/28/2021 CLINICAL DATA:  Unresponsive EXAM: CT CERVICAL SPINE WITHOUT CONTRAST TECHNIQUE: Multidetector CT imaging of the cervical spine was performed without intravenous contrast. Multiplanar CT image reconstructions were also generated. COMPARISON:  None. FINDINGS: Alignment: Preserved. Skull base and vertebrae: No acute cervical spine fracture. Vertebral body heights are maintained. Bulky anterior osteophytes. Soft tissues and spinal canal: No prevertebral fluid or swelling. No visible canal hematoma. Disc levels:  Mild degenerative changes are present. Upper chest: No acute abnormality. Other: Trace calcified plaque at the left common carotid bifurcation. IMPRESSION: No acute cervical spine fracture. Electronically Signed   By: Macy Mis M.D.   On: 03/28/2021 09:11   MR BRAIN W WO CONTRAST  Result Date: 03/29/2021 CLINICAL DATA:  Seizure nontraumatic. EXAM: MRI HEAD WITHOUT AND WITH CONTRAST TECHNIQUE: Multiplanar, multiecho pulse sequences of the brain and surrounding structures were obtained without and with intravenous contrast. CONTRAST:  73mL GADAVIST GADOBUTROL 1 MMOL/ML IV SOLN COMPARISON:  CT head 03/28/2021 FINDINGS: Brain: No acute infarction, hemorrhage, hydrocephalus, extra-axial collection or mass lesion. Normal cerebral white matter. Normal temporal lobe anatomy and signal. Normal enhancement following contrast infusion. Vascular: Normal arterial flow voids. Skull and upper cervical spine: Negative Sinuses/Orbits:  Mucosal edema paranasal sinuses. Air-fluid levels in the sphenoid sinus. Mild mastoid effusion bilaterally Other: None IMPRESSION: Normal MRI brain with contrast Mucosal edema in the paranasal sinuses and mastoid sinus bilaterally. Electronically Signed   By: Franchot Gallo M.D.   On: 03/29/2021 12:54   CT CORONARY MORPH W/CTA COR W/SCORE W/CA W/CM &/OR WO/CM  Addendum Date: 04/01/2021   ADDENDUM REPORT: 04/01/2021 18:13 ADDENDUM: These results were called by telephone at  the time of interpretation on 04/01/2021 at 6:13 pm to provider [facility] , who verbally acknowledged these results. Electronically Signed   By: Markus Daft M.D.   On: 04/01/2021 18:13   Addendum Date: 04/01/2021   ADDENDUM REPORT: 04/01/2021 18:04 EXAM: OVER-READ INTERPRETATION  CT CHEST The following report is an over-read performed by radiologist Dr. Estanislado Pandy [facility] on 04/01/2021. This over-read does not include interpretation of cardiac or coronary anatomy or pathology. The coronary CTA interpretation by the cardiologist is attached. COMPARISON:  Chest CT 03/29/2021 FINDINGS: Questionable filling defect involving a right lower lobe pulmonary artery on sequence 11, image 19. This does not confidently represent pulmonary emboli due to artifact in this area. There is another questionable area in the right lower lobe on image 22 along the medial aspect. Main pulmonary arteries appear to be patent. Visualized mediastinal structures are unremarkable. Imaging of the upper abdomen demonstrates an enlarged left hepatic lobe. Suspect hepatic steatosis. Again noted is mild fullness of the left adrenal gland that is incompletely evaluated. Again noted is volume loss in the medial lower lobes. No significant pleural fluid. Study has limitations from motion artifact. Bridging osteophytes in the visualized thoracic spine. IMPRESSION: 1. Potential filling defects involving pulmonary arteries in the right lower lobe. However, this area has limited evaluation due to motion artifact and poor opacification of the pulmonary arteries. This finding is NOT definitive and recommend a dedicated Chest CTA to evaluate for pulmonary embolism. 2. Persistent volume loss in the medial lower lobes. 3. Enlargement of left hepatic lobe with low-density. Findings are concerning for hepatic steatosis and cirrhosis. Electronically Signed   By: Markus Daft M.D.   On: 04/01/2021 18:04   Result Date: 04/01/2021 EXAM:  Cardiac/Coronary  CT TECHNIQUE: The patient was scanned on a [employer]. FINDINGS: A 120 kV prospective scan was triggered in the descending thoracic aorta at 111 HU's. Axial non-contrast 3 mm slices were carried out through the heart. The data set was analyzed on a dedicated work station and scored using the [place]. Gantry rotation speed was 250 msecs and collimation was .6 mm. No beta blockade and 0.8 mg of sl NTG was given. The 3D data set was reconstructed in 5% intervals of the 67-82 % of the R-R cycle. Diastolic phases were analyzed on a dedicated work station using MPR, MIP and VRT modes. The patient received 80 cc of contrast. Study Quality poor and limited by Noise and stitch artifact. Aorta:  Normal size.  No calcifications.  No dissection. Aortic Valve:  Trileaflet.  No calcifications. Coronary Arteries:  Normal coronary origin.  Right dominance. RCA is a large dominant artery that gives rise to PDA and PLVB. There is no plaque. Left main is a large artery that gives rise to LAD and LCX arteries. There is no plaque. LAD is a large vessel that gives rise to 2 diagonals. Cannot rule out obstructive disease in the distal LAD which is not adequately visualized. No obvious calcified plaque. LCX is a non-dominant artery that gives  rise to one large OM1 branch. Possible high grade non calcified plaque in the mid LCx with associated stenosis of 70-99% but may be due to stitch artifact. Other findings: Normal pulmonary vein drainage into the left atrium. Normal let atrial appendage without a thrombus. Normal size of the pulmonary artery. IMPRESSION: 1. Coronary calcium score of 0. This was 0 percentile for age and sex matched control. 2.  Normal coronary origin with right dominance. 3. No evidence of calcified plaque. Poor quality study for non calcified plaque with possible high grade soft plaque in the mid LCx. The distal LAD is not visualized. 4. The study was not submitted for FFR analysis  due to poor quality and significant stitch artifact. 5. Recommend cardiac cath or nuclear stress testing if clinical suspicion high. Fransico Him Electronically Signed: By: Fransico Him On: 04/01/2021 16:13   DG CHEST PORT 1 VIEW  Result Date: 03/29/2021 CLINICAL DATA:  50 year old female central line placement. EXAM: PORTABLE CHEST 1 VIEW COMPARISON:  CT Chest, Abdomen, and Pelvis today 0113 hours. Portable chest 03/28/2021. FINDINGS: Portable AP semi upright view at 0416 hours. Left subclavian approach central line in place, tip is below the carina at the level of the lower SVC. Otherwise stable lines and tubes. Note that the enteric tube side hole is at the level of the gastroesophageal junction. No pneumothorax. Lower lung volumes since yesterday. Stable cardiac size and mediastinal contours. Confluent lower lobe opacity better demonstrated by CT this morning. Paucity of bowel gas in the upper abdomen. IMPRESSION: 1. Left subclavian central line placed, tip at the lower SVC level. No pneumothorax. 2. Enteric tube side hole at the GEJ level. Advanced 6 cm to ensure side hole placement within the stomach. Electronically Signed   By: Genevie Ann M.D.   On: 03/29/2021 04:32   DG Chest Port 1 View  Result Date: 03/28/2021 CLINICAL DATA:  50 year old female found unresponsive. Status post endotracheal tube placement. EXAM: PORTABLE CHEST 1 VIEW COMPARISON:  Chest x-ray 03/12/2021. FINDINGS: An endotracheal tube is in place with tip 4.3 cm above the carina. A nasogastric tube is seen extending into the stomach, however, the tip of the nasogastric tube extends below the lower margin of the image. Lung volumes are low. No consolidative airspace disease. No pleural effusions. No pneumothorax. No pulmonary nodule or mass noted. Pulmonary vasculature and the cardiomediastinal silhouette are within normal limits. IMPRESSION: 1. Support apparatus, as above. 2. Low lung volumes without radiographic evidence of acute  cardiopulmonary disease. Electronically Signed   By: Vinnie Langton M.D.   On: 03/28/2021 07:27   DG Chest Port 1 View  Result Date: 03/12/2021 CLINICAL DATA:  Cough and shortness of breath EXAM: PORTABLE CHEST 1 VIEW COMPARISON:  12/15/2020 FINDINGS: Stable thoracic spondylosis. Linear opacities at both lung bases favor subsegmental atelectasis. Upper normal heart size. The patient is rotated to the right on today's radiograph, reducing diagnostic sensitivity and specificity. IMPRESSION: 1. Indistinct linear opacities at both lung bases favoring atelectasis. 2. Thoracic spondylosis. Electronically Signed   By: Van Clines M.D.   On: 03/12/2021 19:24   EEG adult  Result Date: 03/28/2021 Lora Havens, MD     03/28/2021  2:08 PM Patient Name: JOLYNN BAJOREK MRN: [id number] Epilepsy Attending: Lora Havens Referring Physician/Provider: Dr Lesleigh Noe Date: 03/28/2021 Duration: 23.03 mins Patient history: a 50 year old female who presents with unresponsiveness. EEG to evaluate for seizure Level of alertness: lethargic AEDs during EEG study:  None Technical aspects:  This EEG study was done with scalp electrodes positioned according to the 10-20 International system of electrode placement. Electrical activity was acquired at a sampling rate of 500Hz  and reviewed with a high frequency filter of 70Hz  and a low frequency filter of 1Hz . EEG data were recorded continuously and digitally stored. Description: No clear posterior dominant rhythm was seen. EEG showed continuous generalized 5-9hz  theta-alpha as well as intermittent generalized 2-3hz  delta slowing. Hyperventilation and photic stimulation were not performed.   ABNORMALITY - Continuous slow, generalized IMPRESSION: This study is suggestive of moderate diffuse encephalopathy, nonspecific etiology but likely related to sedation. No seizures or epileptiform discharges were seen throughout the recording. Lora Havens   ECHOCARDIOGRAM  COMPLETE  Result Date: 03/29/2021    ECHOCARDIOGRAM REPORT   Patient Name:   MELEA PREZIOSO Date of Exam: 03/29/2021 Medical Rec #:  [id number]            Height:       62.0 in Accession #:    [id number]           Weight:       382.9 lb Date of Birth:  1971-01-30            BSA:          2.519 m Patient Age:    36 years             BP:           105/66 mmHg Patient Gender: F                    HR:           82 bpm. Exam Location:  Inpatient Procedure: 2D Echo Indications:    acute ischemic heart disease  History:        Patient has prior history of Echocardiogram examinations, most                 recent 04/14/2018. Signs/Symptoms:elevated troponin.  Sonographer:    Johny Chess Referring Phys: [id number] Frederik Pear  Sonographer Comments: Echo performed with patient supine and on artificial respirator. Image acquisition challenging due to patient body habitus. IMPRESSIONS  1. Left ventricular ejection fraction, by estimation, is 60 to 65%. The left ventricle has normal function. The left ventricle has no regional wall motion abnormalities. Left ventricular diastolic parameters were normal.  2. Right ventricular systolic function is normal. The right ventricular size is normal.  3. The mitral valve is normal in structure. No evidence of mitral valve regurgitation. No evidence of mitral stenosis.  4. The aortic valve is normal in structure. Aortic valve regurgitation is not visualized. No aortic stenosis is present.  5. The inferior vena cava is normal in size with greater than 50% respiratory variability, suggesting right atrial pressure of 3 mmHg. Comparison(s): Prior images unable to be directly viewed, comparison made by report only. FINDINGS  Left Ventricle: Left ventricular ejection fraction, by estimation, is 60 to 65%. The left ventricle has normal function. The left ventricle has no regional wall motion abnormalities. The left ventricular internal cavity size was normal in size. There is  no left  ventricular hypertrophy. Left ventricular diastolic parameters were normal. Right Ventricle: The right ventricular size is normal. No increase in right ventricular wall thickness. Right ventricular systolic function is normal. Left Atrium: Left atrial size was normal in size. Right Atrium: Right atrial size was normal in size. Pericardium: There is no evidence of pericardial effusion. Mitral Valve:  The mitral valve is normal in structure. No evidence of mitral valve regurgitation. No evidence of mitral valve stenosis. Tricuspid Valve: The tricuspid valve is normal in structure. Tricuspid valve regurgitation is not demonstrated. No evidence of tricuspid stenosis. Aortic Valve: The aortic valve is normal in structure. Aortic valve regurgitation is not visualized. No aortic stenosis is present. Aortic valve mean gradient measures 8.0 mmHg. Aortic valve peak gradient measures 15.1 mmHg. Aortic valve area, by VTI measures 1.92 cm. Pulmonic Valve: The pulmonic valve was normal in structure. Pulmonic valve regurgitation is not visualized. No evidence of pulmonic stenosis. Aorta: The aortic root is normal in size and structure. Venous: The inferior vena cava is normal in size with greater than 50% respiratory variability, suggesting right atrial pressure of 3 mmHg. IAS/Shunts: No atrial level shunt detected by color flow Doppler.  LEFT VENTRICLE PLAX 2D LVIDd:         4.60 cm  Diastology LVIDs:         2.90 cm  LV e' medial:    7.72 cm/s LV PW:         1.10 cm  LV E/e' medial:  11.4 LV IVS:        1.05 cm  LV e' lateral:   6.64 cm/s LVOT diam:     1.90 cm  LV E/e' lateral: 13.3 LV SV:         64 LV SV Index:   25 LVOT Area:     2.84 cm  RIGHT VENTRICLE RV S prime:     17.80 cm/s TAPSE (M-mode): 2.3 cm LEFT ATRIUM             Index       RIGHT ATRIUM           Index LA diam:        4.00 cm 1.59 cm/m  RA Area:     18.80 cm LA Vol (A2C):   51.0 ml 20.24 ml/m RA Volume:   52.90 ml  21.00 ml/m LA Vol (A4C):   54.3 ml 21.55  ml/m LA Biplane Vol: 52.4 ml 20.80 ml/m  AORTIC VALVE AV Area (Vmax):    2.13 cm AV Area (Vmean):   1.95 cm AV Area (VTI):     1.92 cm AV Vmax:           194.00 cm/s AV Vmean:          132.000 cm/s AV VTI:            0.332 m AV Peak Grad:      15.1 mmHg AV Mean Grad:      8.0 mmHg LVOT Vmax:         146.00 cm/s LVOT Vmean:        90.600 cm/s LVOT VTI:          0.225 m LVOT/AV VTI ratio: 0.68  AORTA Ao Root diam: 2.80 cm Ao Asc diam:  2.90 cm MITRAL VALVE MV Area (PHT): 2.83 cm    SHUNTS MV Decel Time: 268 msec    Systemic VTI:  0.22 m MV E velocity: 88.10 cm/s  Systemic Diam: 1.90 cm MV A velocity: 78.80 cm/s MV E/A ratio:  1.12 Mihai Croitoru MD Electronically signed by Sanda Klein MD Signature Date/Time: 03/29/2021/1:45:51 PM    Final    [place] EKG SITE RITE  Result Date: 03/28/2021 If Site Rite image not attached, placement could not be confirmed due to current cardiac rhythm.  CT CHEST ABDOMEN PELVIS WO CONTRAST  Result Date: 03/29/2021 CLINICAL DATA:  Sepsis EXAM: CT CHEST, ABDOMEN AND PELVIS WITHOUT CONTRAST TECHNIQUE: Multidetector CT imaging of the chest, abdomen and pelvis was performed following the standard protocol without IV contrast. COMPARISON:  Chest radiograph dated 03/28/2021. CT abdomen/pelvis dated 04/26/2020. CTA chest dated 08/28/2019. FINDINGS: CT CHEST FINDINGS Cardiovascular: The heart is normal in size. No pericardial effusion. No evidence of thoracic aortic aneurysm. Mediastinum/Nodes: No suspicious mediastinal lymphadenopathy. Visualized thyroid is unremarkable. Lungs/Pleura: Endotracheal tube terminates 2.2 cm above the carina. Patchy dependent opacities in the bilateral lower lobes, favoring atelectasis. Additional mild dependent atelectasis in the bilateral upper lobes. Evaluation of the lung parenchyma is constrained by respiratory motion. Within that constraint, there are no suspicious pulmonary nodules. No focal consolidation. No pleural effusion or pneumothorax.  Musculoskeletal: Degenerative changes of the thoracic spine. CT ABDOMEN PELVIS FINDINGS Hepatobiliary: Unenhanced liver is unremarkable. Status post cholecystectomy. No intrahepatic or extrahepatic ductal dilatation. Pancreas: Within normal limits. Spleen: Within normal limits. Adrenals/Urinary Tract: Adrenal glands are within normal limits. Excretory contrast in the bilateral renal collecting systems related to recent CTA. Kidneys otherwise within normal limits. No hydronephrosis. Excretory contrast in the bladder. Stomach/Bowel: Enteric tube terminates in the gastric cardia, just distal to the GE junction. No evidence of bowel obstruction. Normal appendix (series 3/image 92). No colonic wall thickening or inflammatory changes. Vascular/Lymphatic: No evidence of abdominal aortic aneurysm. No suspicious abdominopelvic lymphadenopathy. Reproductive: Uterus is within normal limits. Bilateral ovaries are within normal limits. Other: No abdominopelvic ascites. Musculoskeletal: Status post PLIF at L5-S1. Degenerative changes of the lumbar spine. IMPRESSION: Endotracheal tube terminates 2.2 cm above the carina. Enteric tube terminates in the gastric cardia, just below the GE junction. Patchy/dependent opacities in the posterior upper and lower lobes, favoring atelectasis. No acute findings in the abdomen/pelvis. Electronically Signed   By: Julian Hy M.D.   On: 03/29/2021 02:19   CT ANGIO HEAD NECK W WO CM (CODE STROKE)  Result Date: 03/28/2021 CLINICAL DATA:  Unresponsive EXAM: CT ANGIOGRAPHY HEAD AND NECK TECHNIQUE: Multidetector CT imaging of the head and neck was performed using the standard protocol during bolus administration of intravenous contrast. Multiplanar CT image reconstructions and MIPs were obtained to evaluate the vascular anatomy. Carotid stenosis measurements (when applicable) are obtained utilizing NASCET criteria, using the distal internal carotid diameter as the denominator. CONTRAST:   53mL OMNIPAQUE IOHEXOL 350 MG/ML SOLN COMPARISON:  None. FINDINGS: CTA NECK Aortic arch: Great vessel origins are patent. Right carotid system: Patent. Common carotid is partially obscured by adjacent venous artifact. No stenosis. Left carotid system: Patent. Minor plaque along the distal common carotid. No stenosis. Vertebral arteries: Patent and codominant. Skeleton: Cervical spine dictated previously. Other neck: Enteric and endotracheal tubes are present. Upper chest: No apical lung mass. Review of the MIP images confirms the above findings CTA HEAD Anterior circulation: Intracranial internal carotid arteries are patent. Anterior cerebral arteries are patent. Left A1 ACA is dominant. Anterior communicating artery is present. Middle cerebral arteries are patent. Posterior circulation: Intracranial vertebral arteries are patent. Basilar artery is patent. Major cerebellar artery origins are patent. Bilateral posterior communicating arteries are present. There is fetal origin of the right PCA. Posterior cerebral arteries are patent. Venous sinuses: Patent as allowed by contrast bolus timing. Review of the MIP images confirms the above findings IMPRESSION: No large vessel occlusion, hemodynamically significant stenosis, or evidence of dissection. Electronically Signed   By: Macy Mis M.D.   On: 03/28/2021 10:14     Subjective: No acute issues or  events overnight denies nausea vomiting diarrhea constipation headache fevers chills or chest pain   Discharge Exam: Vitals:   04/03/21 0901 04/03/21 1031  BP:  (!) 159/100  Pulse: 81 96  Resp: 19   Temp: (!) 97.3 F (36.3 C) 98.4 F (36.9 C)  SpO2: 95% 98%   Vitals:   04/02/21 2140 04/03/21 0503 04/03/21 0901 04/03/21 1031  BP: (!) 113/52 (!) 142/78  (!) 159/100  Pulse: 71 70 81 96  Resp: $Remo'20 18 19   'GtVLO$ Temp: 98.3 F (36.8 C) 98 F (36.7 C) (!) 97.3 F (36.3 C) 98.4 F (36.9 C)  TempSrc:   Oral Oral  SpO2: 97% 99% 95% 98%  Weight:      Height:         General: Pt is alert, awake, not in acute distress Cardiovascular: RRR, S1/S2 +, no rubs, no gallops Respiratory: CTA bilaterally, no wheezing, no rhonchi Abdominal: Soft, NT, obese, ND, bowel sounds + Extremities: no edema, no cyanosis    The results of significant diagnostics from this hospitalization (including imaging, microbiology, ancillary and laboratory) are listed below for reference.     Microbiology: Recent Results (from the past 240 hour(s))  Resp Panel by RT-PCR (Flu A&B, Covid) Nasopharyngeal Swab     Status: None   Collection Time: 03/28/21  7:19 AM   Specimen: Nasopharyngeal Swab; Nasopharyngeal(NP) swabs in vial transport medium  Result Value Ref Range Status   SARS Coronavirus 2 by RT PCR NEGATIVE NEGATIVE Final    Comment: (NOTE) SARS-CoV-2 target nucleic acids are NOT DETECTED.  The SARS-CoV-2 RNA is generally detectable in upper respiratory specimens during the acute phase of infection. The lowest concentration of SARS-CoV-2 viral copies this assay can detect is 138 copies/mL. A negative result does not preclude SARS-Cov-2 infection and should not be used as the sole basis for treatment or other patient management decisions. A negative result may occur with  improper specimen collection/handling, submission of specimen other than nasopharyngeal swab, presence of viral mutation(s) within the areas targeted by this assay, and inadequate number of viral copies(<138 copies/mL). A negative result must be combined with clinical observations, patient history, and epidemiological information. The expected result is Negative.  Fact Sheet for Patients:  EntrepreneurPulse.com.au  Fact Sheet for Healthcare Providers:  IncredibleEmployment.be  This test is no t yet approved or cleared by the [place] FDA and  has been authorized for detection and/or diagnosis of SARS-CoV-2 by FDA under an Emergency Use Authorization  (EUA). This EUA will remain  in effect (meaning this test can be used) for the duration of the COVID-19 declaration under Section 564(b)(1) of the Act, 21 U.S.C.section 360bbb-3(b)(1), unless the authorization is terminated  or revoked sooner.       Influenza A by PCR NEGATIVE NEGATIVE Final   Influenza B by PCR NEGATIVE NEGATIVE Final    Comment: (NOTE) The Xpert Xpress SARS-CoV-2/FLU/RSV plus assay is intended as an aid in the diagnosis of influenza from Nasopharyngeal swab specimens and should not be used as a sole basis for treatment. Nasal washings and aspirates are unacceptable for Xpert Xpress SARS-CoV-2/FLU/RSV testing.  Fact Sheet for Patients: EntrepreneurPulse.com.au  Fact Sheet for Healthcare Providers: IncredibleEmployment.be  This test is not yet approved or cleared by the [place] FDA and has been authorized for detection and/or diagnosis of SARS-CoV-2 by FDA under an Emergency Use Authorization (EUA). This EUA will remain in effect (meaning this test can be used) for the duration of the COVID-19 declaration under Section  564(b)(1) of the Act, 21 U.S.C. section 360bbb-3(b)(1), unless the authorization is terminated or revoked.  Performed at [facility], [place] [address], [place], [place] [address]   MRSA Next Gen by PCR, Nasal     Status: None   Collection Time: 03/28/21  3:29 PM   Specimen: Nasal Mucosa; Nasal Swab  Result Value Ref Range Status   MRSA by PCR Next Gen NOT DETECTED NOT DETECTED Final    Comment: (NOTE) The GeneXpert MRSA Assay (FDA approved for NASAL specimens only), is one component of a comprehensive MRSA colonization surveillance program. It is not intended to diagnose MRSA infection nor to guide or monitor treatment for MRSA infections. Test performance is not FDA approved in patients less than 82 years old. Performed at [facility], [place] [address] [place], [place] [address]    Blood culture (routine x 2)     Status: None   Collection Time: 03/28/21  8:50 PM   Specimen: BLOOD LEFT HAND  Result Value Ref Range Status   Specimen Description BLOOD LEFT HAND  Final   Special Requests   Final    BOTTLES DRAWN AEROBIC ONLY Blood Culture adequate volume   Culture   Final    NO GROWTH 5 DAYS Performed at [facility], [place] [address] [place], [place] [address]    Report Status 04/02/2021 FINAL  Final  Blood culture (routine x 2)     Status: None (Preliminary result)   Collection Time: 03/28/21 11:15 PM   Specimen: BLOOD  Result Value Ref Range Status   Specimen Description BLOOD SITE NOT SPECIFIED  Final   Special Requests   Final    BOTTLES DRAWN AEROBIC AND ANAEROBIC Blood Culture adequate volume   Culture   Final    NO GROWTH 4 DAYS Performed at [facility], [place] [address] [place], [place] [address]    Report Status PENDING  Incomplete     Labs: BNP (last 3 results) Recent Labs    12/15/20 0509 03/12/21 1822 03/28/21 0653  BNP 19.9 17.5 94.8   Basic Metabolic Panel: Recent Labs  Lab 03/28/21 2315 03/29/21 0523 03/29/21 1015 03/30/21 0349 03/31/21 0407 04/01/21 0334 04/02/21 0548  NA 140   < > 141 138 139 140 138  K 3.5   < > 3.3* 3.9 3.4* 4.1 3.9  CL 105  --   --  110 109 110 103  CO2 26  --   --  $R'23 23 27 28  'Hk$ GLUCOSE 131*  --   --  184* 122* 133* 128*  BUN 13  --   --  $R'7 8 9 8  'uG$ CREATININE 1.00  --   --  0.64 0.66 0.59 0.58  CALCIUM 9.0  --   --  8.6* 8.7* 8.8* 9.2  MG 2.1  --   --   --   --   --   --    < > = values in this interval not displayed.   Liver Function Tests: Recent Labs  Lab 03/28/21 0653 03/28/21 2315 04/02/21 0548  AST 102* 99* 19  ALT 96* 157* 54*  ALKPHOS 80 71 52  BILITOT 0.5 0.6 0.5  PROT 6.3* 6.4* 5.8*  ALBUMIN 3.2* 3.1* 2.8*   No results for input(s): LIPASE, AMYLASE in the last 168 hours. Recent Labs  Lab 03/28/21 0653 03/28/21 2315  AMMONIA 189* 30   CBC: Recent Labs  Lab  03/28/21 0653 03/28/21 0655 03/30/21 0349  03/31/21 0407 04/01/21 0334 04/02/21 0548 04/03/21 0251  WBC 26.2*   < > 11.9* 9.7 7.7 9.1 8.8  NEUTROABS 10.5*  --   --   --   --   --   --   HGB 13.9   < > 11.6* 11.2* 10.9* 11.4* 11.8*  HCT 45.7   < > 35.8* 34.8* 33.9* 35.0* 34.5*  MCV 105.5*   < > 100.6* 98.9 98.8 96.7 95.0  PLT 250   < > 147* 167 168 175 175   < > = values in this interval not displayed.   Cardiac Enzymes: No results for input(s): CKTOTAL, CKMB, CKMBINDEX, TROPONINI in the last 168 hours. BNP: Invalid input(s): POCBNP CBG: Recent Labs  Lab 04/02/21 1138 04/02/21 1647 04/02/21 2138 04/03/21 0734 04/03/21 1122  GLUCAP 122* 115* 150* 138* 113*   D-Dimer No results for input(s): DDIMER in the last 72 hours. Hgb A1c No results for input(s): HGBA1C in the last 72 hours. Lipid Profile Recent Labs    04/02/21 0548  CHOL 141  HDL 48  LDLCALC 66  TRIG 135  CHOLHDL 2.9   Thyroid function studies No results for input(s): TSH, T4TOTAL, T3FREE, THYROIDAB in the last 72 hours.  Invalid input(s): FREET3 Anemia work up No results for input(s): VITAMINB12, FOLATE, FERRITIN, TIBC, IRON, RETICCTPCT in the last 72 hours. Urinalysis    Component Value Date/Time   COLORURINE YELLOW 03/28/2021 1807   APPEARANCEUR CLOUDY (A) 03/28/2021 1807   LABSPEC 1.032 (H) 03/28/2021 1807   PHURINE 5.0 03/28/2021 1807   GLUCOSEU NEGATIVE 03/28/2021 1807   HGBUR MODERATE (A) 03/28/2021 1807   BILIRUBINUR NEGATIVE 03/28/2021 1807   BILIRUBINUR neg 06/19/2015 1142   KETONESUR NEGATIVE 03/28/2021 1807   PROTEINUR 100 (A) 03/28/2021 1807   UROBILINOGEN 0.2 06/19/2015 1142   UROBILINOGEN 0.2 08/24/2013 1416   NITRITE NEGATIVE 03/28/2021 1807   LEUKOCYTESUR NEGATIVE 03/28/2021 1807   Sepsis Labs Invalid input(s): PROCALCITONIN,  WBC,  LACTICIDVEN Microbiology Recent Results (from the past 240 hour(s))  Resp Panel by RT-PCR (Flu A&B, Covid) Nasopharyngeal Swab     Status: None    Collection Time: 03/28/21  7:19 AM   Specimen: Nasopharyngeal Swab; Nasopharyngeal(NP) swabs in vial transport medium  Result Value Ref Range Status   SARS Coronavirus 2 by RT PCR NEGATIVE NEGATIVE Final    Comment: (NOTE) SARS-CoV-2 target nucleic acids are NOT DETECTED.  The SARS-CoV-2 RNA is generally detectable in upper respiratory specimens during the acute phase of infection. The lowest concentration of SARS-CoV-2 viral copies this assay can detect is 138 copies/mL. A negative result does not preclude SARS-Cov-2 infection and should not be used as the sole basis for treatment or other patient management decisions. A negative result may occur with  improper specimen collection/handling, submission of specimen other than nasopharyngeal swab, presence of viral mutation(s) within the areas targeted by this assay, and inadequate number of viral copies(<138 copies/mL). A negative result must be combined with clinical observations, patient history, and epidemiological information. The expected result is Negative.  Fact Sheet for Patients:  EntrepreneurPulse.com.au  Fact Sheet for Healthcare Providers:  IncredibleEmployment.be  This test is no t yet approved or cleared by the [place] FDA and  has been authorized for detection and/or diagnosis of SARS-CoV-2 by FDA under an Emergency Use Authorization (EUA). This EUA will remain  in effect (meaning this test can be used) for the duration of the COVID-19 declaration under Section 564(b)(1) of the Act, 21 U.S.C.section 360bbb-3(b)(1), unless the  authorization is terminated  or revoked sooner.       Influenza A by PCR NEGATIVE NEGATIVE Final   Influenza B by PCR NEGATIVE NEGATIVE Final    Comment: (NOTE) The Xpert Xpress SARS-CoV-2/FLU/RSV plus assay is intended as an aid in the diagnosis of influenza from Nasopharyngeal swab specimens and should not be used as a sole basis for treatment.  Nasal washings and aspirates are unacceptable for Xpert Xpress SARS-CoV-2/FLU/RSV testing.  Fact Sheet for Patients: EntrepreneurPulse.com.au  Fact Sheet for Healthcare Providers: IncredibleEmployment.be  This test is not yet approved or cleared by the [place] FDA and has been authorized for detection and/or diagnosis of SARS-CoV-2 by FDA under an Emergency Use Authorization (EUA). This EUA will remain in effect (meaning this test can be used) for the duration of the COVID-19 declaration under Section 564(b)(1) of the Act, 21 U.S.C. section 360bbb-3(b)(1), unless the authorization is terminated or revoked.  Performed at [facility], [place] [address], [place], [place] [address]   MRSA Next Gen by PCR, Nasal     Status: None   Collection Time: 03/28/21  3:29 PM   Specimen: Nasal Mucosa; Nasal Swab  Result Value Ref Range Status   MRSA by PCR Next Gen NOT DETECTED NOT DETECTED Final    Comment: (NOTE) The GeneXpert MRSA Assay (FDA approved for NASAL specimens only), is one component of a comprehensive MRSA colonization surveillance program. It is not intended to diagnose MRSA infection nor to guide or monitor treatment for MRSA infections. Test performance is not FDA approved in patients less than 3 years old. Performed at [facility], [place] [address], [place], [place] [address]   Blood culture (routine x 2)     Status: None   Collection Time: 03/28/21  8:50 PM   Specimen: BLOOD LEFT HAND  Result Value Ref Range Status   Specimen Description BLOOD LEFT HAND  Final   Special Requests   Final    BOTTLES DRAWN AEROBIC ONLY Blood Culture adequate volume   Culture   Final    NO GROWTH 5 DAYS Performed at [facility], [place] [address], [place], [place] [address]    Report Status 04/02/2021 FINAL  Final  Blood culture (routine x 2)     Status: None (Preliminary result)   Collection Time: 03/28/21 11:15 PM   Specimen:  BLOOD  Result Value Ref Range Status   Specimen Description BLOOD SITE NOT SPECIFIED  Final   Special Requests   Final    BOTTLES DRAWN AEROBIC AND ANAEROBIC Blood Culture adequate volume   Culture   Final    NO GROWTH 4 DAYS Performed at [facility], [place] [address], [place], [place] [address]    Report Status PENDING  Incomplete     Time coordinating discharge: Over 30 minutes  SIGNED:   Little Ishikawa, DO [facility] 04/03/2021, 12:46 PM Pager   If 7PM-7AM, please contact night-coverage [email or web address]

## 2021-04-03 NOTE — Progress Notes (Signed)
DISCHARGE NOTE HOME Lori Bean to be discharged Home per MD order. Discussed prescriptions and follow up appointments with the patient. Prescriptions given to patient; medication list explained in detail. Patient verbalized understanding.  Skin clean, dry and intact without evidence of skin break down, no evidence of skin tears noted. IV catheter discontinued intact. Site without signs and symptoms of complications. Dressing and pressure applied. Pt denies pain at the site currently. No complaints noted.  Patient free of lines, drains, and wounds.   An After Visit Summary (AVS) was printed and given to the patient. Patient escorted via wheelchair, and discharged home via private auto.  Berneta Levins, RN

## 2021-04-03 NOTE — Progress Notes (Signed)
Physical Therapy Treatment Patient Details Name: Lori Bean MRN: [id number] DOB: 1971-05-09 Today's Date: 04/03/2021    History of Present Illness 50 y/o female admitted 7/15 with acute metabolic encephalopathy following an asthma exacerbation. Intubated 7/15-7/17 PMH: OSA, asthma, diastolic HF, W0JW, anxiety, bipolar disorder, and schizophrenia.    PT Comments    Pt sitting up in recliner after showering. Focus of session new Rollator adjustment and proper usage. Pt is mod I for sit>stand and supervision for ambulation in hallway. Pt able to demonstrate safe use of Rollator breaks for seated rest breaks as well as transfers. Pt with 2/4 DoE with ambulation and reports feeling much better. Plan for d/c after PT session.    Follow Up Recommendations  Home health PT;Supervision - Intermittent     Equipment Recommendations  Other (comment) (rollator)    Recommendations for Other Services       Precautions / Restrictions Precautions Precautions: Fall Restrictions Weight Bearing Restrictions: No    Mobility  Bed Mobility               General bed mobility comments: sitting up in recliner on entry    Transfers Overall transfer level: Needs assistance Equipment used: Rolling walker (2 wheeled);4-wheeled walker Transfers: Sit to/from Stand Sit to Stand: Modified independent (Device/Increase time)         General transfer comment: increased effort  Ambulation/Gait Ambulation/Gait assistance: Supervision Gait Distance (Feet): 60 Feet (x2) Assistive device: 4-wheeled walker Gait Pattern/deviations: Step-through pattern;Decreased stride length;Trunk flexed;Wide base of support Gait velocity: decr Gait velocity interpretation: <1.31 ft/sec, indicative of household ambulator General Gait Details: supervision for safety with use of new Rollator, vc for proper break usage and performing seated rest break,       Balance Overall balance assessment: Needs  assistance Sitting-balance support: No upper extremity supported;Feet supported Sitting balance-Leahy Scale: Good     Standing balance support: During functional activity;No upper extremity supported Standing balance-Leahy Scale: Fair Standing balance comment: Static standing without support. UE support for dynamic activities                            Cognition Arousal/Alertness: Awake/alert Behavior During Therapy: WFL for tasks assessed/performed Overall Cognitive Status: Within Functional Limits for tasks assessed                                           General Comments General comments (skin integrity, edema, etc.): Ambulated on RA      Pertinent Vitals/Pain Pain Assessment: No/denies pain     PT Goals (current goals can now be found in the care plan section) Acute Rehab PT Goals Patient Stated Goal: To return home. PT Goal Formulation: With patient Time For Goal Achievement: 04/15/21 Potential to Achieve Goals: Good Progress towards PT goals: Progressing toward goals    Frequency    Min 3X/week      PT Plan Current plan remains appropriate;Equipment recommendations need to be updated       AM-PAC PT "6 Clicks" Mobility   Outcome Measure  Help needed turning from your back to your side while in a flat bed without using bedrails?: None Help needed moving from lying on your back to sitting on the side of a flat bed without using bedrails?: A Little Help needed moving to and from a bed to a chair (including a  wheelchair)?: A Little Help needed standing up from a chair using your arms (e.g., wheelchair or bedside chair)?: A Little Help needed to walk in hospital room?: A Little Help needed climbing 3-5 steps with a railing? : A Little 6 Click Score: 19    End of Session Equipment Utilized During Treatment: Gait belt Activity Tolerance: Patient tolerated treatment well Patient left: in chair;with call bell/phone within  reach Nurse Communication: Mobility status PT Visit Diagnosis: Other abnormalities of gait and mobility (R26.89);Muscle weakness (generalized) (M62.81);Unsteadiness on feet (R26.81)     Time: 0174-9449 PT Time Calculation (min) (ACUTE ONLY): 12 min  Charges:  $Gait Training: 8-22 mins                     Andrick Rust B. Migdalia Dk PT, DPT Acute Rehabilitation Services Pager [phone] Office [phone]    [place] 04/03/2021, 12:48 PM

## 2021-04-03 NOTE — TOC Progression Note (Signed)
Transition of Care [facility]) - Progression Note    Patient Details  Name: Lori Bean MRN: [id number] Date of Birth: 02-Sep-1971  Transition of [facility]) CM/SW Contact  Loletha Grayer Beverely Pace, RN Phone Number: 04/03/2021, 10:39 AM  Clinical Narrative: Case manager spoke with patient concerning discharge needs. Received permission to call referral to [place] agency that will accept her coverage. Referral was called to [facility] with [facility] and declined. CM contacted Adela Lank, Liaison with [facility], they have accepted patient. Patient asked if CM could assist her with getting a air conditioner because of her breathing issues. CM suggested she contact at Education officer, museum at [employer] also provided her with number for [facility] [phone].     Expected Discharge Plan: [place] Barriers to Discharge: No Barriers Identified  Expected Discharge Plan and Services Expected Discharge Plan: [place] In-house Referral: NA Discharge Planning Services: CM Consult Post Acute Care Choice: Durable Medical Equipment, Home Health Living arrangements for the past 2 months: Single Family Home                 DME Arranged: Walker rolling with seat DME Agency: [employer] Date DME Agency Contacted: 04/03/21 Time DME Agency Contacted: [phone] Representative spoke with at DME Agency: Freda Munro HH Arranged: PT [place]: [place] Date [place]: 04/03/21 Time [place]: [phone] Representative spoke with at [place]: [place] ([place]) Interventions    Readmission Risk Interventions Readmission Risk Prevention Plan 03/14/2021  Transportation Screening Complete  PCP or Specialist Appt within 3-5 Days Complete  HRI or [place] Complete  Social Work Consult for [place] Planning/Counseling [place] Not Applicable  Medication Review Press photographer)  Complete  Some recent data might be hidden

## 2021-04-23 ENCOUNTER — Encounter: Payer: Self-pay | Admitting: Primary Care

## 2021-04-23 ENCOUNTER — Other Ambulatory Visit: Payer: Self-pay

## 2021-04-23 ENCOUNTER — Ambulatory Visit (INDEPENDENT_AMBULATORY_CARE_PROVIDER_SITE_OTHER): Payer: Medicaid Other

## 2021-04-23 ENCOUNTER — Ambulatory Visit (INDEPENDENT_AMBULATORY_CARE_PROVIDER_SITE_OTHER): Payer: Medicaid Other | Admitting: Primary Care

## 2021-04-23 ENCOUNTER — Telehealth: Payer: Self-pay | Admitting: Primary Care

## 2021-04-23 VITALS — BP 138/90 | HR 77 | Temp 97.5°F | Ht 62.0 in | Wt 260.2 lb

## 2021-04-23 DIAGNOSIS — G4733 Obstructive sleep apnea (adult) (pediatric): Secondary | ICD-10-CM | POA: Diagnosis not present

## 2021-04-23 DIAGNOSIS — J9601 Acute respiratory failure with hypoxia: Secondary | ICD-10-CM | POA: Diagnosis not present

## 2021-04-23 DIAGNOSIS — J4520 Mild intermittent asthma, uncomplicated: Secondary | ICD-10-CM

## 2021-04-23 DIAGNOSIS — J449 Chronic obstructive pulmonary disease, unspecified: Secondary | ICD-10-CM

## 2021-04-23 DIAGNOSIS — J189 Pneumonia, unspecified organism: Secondary | ICD-10-CM | POA: Diagnosis not present

## 2021-04-23 DIAGNOSIS — J9602 Acute respiratory failure with hypercapnia: Secondary | ICD-10-CM

## 2021-04-23 NOTE — Patient Instructions (Signed)
Nice seeing you today  Recommendations: - Continue Breztri two puffs morning and evening - Continue Singulair '10mg'$  at bedtime - Use albuterol inhaler/nebulizer every 6 hours for sob/chest tightness/wheezing - Continue to wear BIPAP every night 4-6 hours or longer - Contact cardiology if you have any further episodes of chest pain - Call our office if you have increased lethargy, sob/cough/purulent mucus  Orders: - CXR today re: CAP   Follow-up: -3 months with Dr. [name]

## 2021-04-23 NOTE — Telephone Encounter (Signed)
Lori Bean can we please place an order for BIPAP setting to be changed to 15/8 and have her follow-up televisit with me in approximately 1 month just to see how she is doing with lowered pressure setting.

## 2021-04-23 NOTE — Assessment & Plan Note (Addendum)
-   Patient is compliant with BIPAP, there was gap d/t recent hospitalization.  [facility] download 03/23/21-04/21/21 showed 77% compliance; pressure 15/5cm h20; residual AHI 4.3. Her pressure setting is higher than it was during her last visit, there is no mention of our office changing these settings. Her previous setting was 11/8. Spoke with Dr. [name] and he recommended pressure setting 15/8cm h20. We will place DME order and set her up for 1 month televisit to review download

## 2021-04-23 NOTE — Progress Notes (Signed)
$'@Patient'E$  ID: Freda Jackson, female    DOB: Feb 18, 1971, 50 y.o.   MRN: [id number]  Chief Complaint  Patient presents with   Follow-up    Patient is here for a follow up on shortness of breath and was told she had a heart attack.     Referring provider: [name], [place]  HPI: 50 year old female, former smoker quit in April 2022 (24-pack-year history).  Past medical history significant for chronic asthma, obstructive sleep apnea, hypertensive heart disease, chronic diastolic heart failure, bipolar disorder, schizophrenia.  Patient of Dr. [name], last seen in office in April 2022.   Previous [facility] pulmonary encounter:  The office visit with me was 09/2018, weight then was 255 pounds. She is currently at 265.  She is very compliant with her BiPAP machine, used to complain of high pressure but once we changed her auto BiPAP settings this is worked very well for her.  Does not miss a single night. She was hospitalized 4/3 for asthma exacerbation, reviewed discharge summary, seem to have been exacerbated by grill smoke.  She continues to smoke a pack per day but has not smoked again since discharge.  Her diuretics were clarified as torsemide 40 mg twice daily Chest x-ray independently reviewed, does not show any infiltrates  Labs showed normal electrolytes, BNP was 20 VBG 08/2019 showed 7.3 6/33/82   04/23/2021- Interim hx  Patient presents today for hospital follow-up for acute hypercapnic and hypoxic respiratory failure requiring intubation and mechanical ventilation.  Patient presented to the emergency room on 7/15 with altered mental status.  EMS was called on patient who was unresponsive, hypotensive and in respiratory distress.  Patient was given Narcan and breathing assisted with BVM  In the emergency room she was found to be hypertensive, she was intubated and sedated.  CT chest on 03/29/2021 showed patchy/dependent opacities in the posterior upper and lower lobes, favoring  atelectasis.  CTA on 04/02/2021 showed no evidence of pulmonary embolism, mild bilateral posterior basilar subsegmental atelectasis.  She was found to have elevated troponin which was felt to be related to NSTEMI versus demand ischemia.  Echocardiogram showed EF [id number], normal diastolic parameters.  Treated for pneumonia with ceftriaxone, vancomycin.  UDS was positive for cannabis.  MRI brain normal, CT head negative.   She is doing much better.  Maintained on Breztri Aerosphere and Singulair for asthma. She is compliant with these medications. She has not needed to use albuterol hfa/neb since she was discharged. Prior when she was smoking she was using SABA 4-6 times a day. She has not had a cigarettes since she was admitted to the hospital in July 2022. She is wearing her BIPAP at night without issue. Sleeping well at night. She had some mild chest pain last night that did not last long. She saw cardiology while in-patient. Denies f/c/s shortness of breath, cough, mucus production, chest tightness, wheezing.    Airview download 03/23/21-04/21/21 (admitted to hospital in July) 23/30 days used (77%); 21 (70%) > 4 hours Average usages days used 7 hours 52 mins Pressure 15/5, PS 3 Airleaks 29.6L/min AHI 4.3    Significant tests/ events reviewed NPSG 06/2016  moderate OSA with AHI of 26/hour with lowest desaturation of 87%. This was corrected by CPAP of 14 cm with a small fullface mask.   07/2017 titration >> Bipap 16/12   07/20/2018 PFTs - FVC 1.77 (65%), FEV1 1.33 (60%), RATIO 75, DLCOcor 17.01 (78%)    07/04/18 Spirometry- FVC 1.6 (59%), FEV1 1.1 (50%),  ratio 69   10/28/2017 Spirometry - FEV1 1.01 (45%)  Ratio 67    No Active Allergies  Immunization History  Administered Date(s) Administered   Influenza Inj Mdck Quad Pf 08/22/2018   Influenza Split 06/07/2013, 06/15/2015   Influenza,inj,Quad PF,6+ Mos 07/22/2017, 08/31/2019, 06/11/2020   Influenza,inj,quad, With Preservative 08/15/2019    Moderna Sars-Covid-2 Vaccination 12/03/2019, 01/01/2020, 10/24/2020   Pneumococcal Polysaccharide-23 04/03/2014, 08/31/2019   Rabies, IM 05/04/2016, 05/12/2016    Past Medical History:  Diagnosis Date   Abdominal hernia    Anginal pain (HCC)    occ from asthma   Anxiety    Arthritis    Asthma    Bipolar affective disorder ([place])    Schizophrenia   Bronchitis    hx of   CHF (congestive heart failure) ([place])    Depression    Diabetes mellitus without complication ([place])    " New Onset " per patient   GERD (gastroesophageal reflux disease)    Heart murmur    Hypertension    takes meds   Lymphedema 06/07/2018   Morbid (severe) obesity due to excess calories (HCC)    Neuropathy    left leg , "from back surgery"   Overactive bladder    Schizophrenia ([place])    Sciatica    Shortness of breath    Sleep apnea    Snoring disorder    Pt stated my boyfriend always wakes me up and tells me to breathe    Tobacco History: Social History   Tobacco Use  Smoking Status Former   Packs/day: 1.00   Years: 24.00   Pack years: 24.00   Types: Cigarettes   Quit date: 12/16/2020   Years since quitting: 0.3  Smokeless Tobacco Never  Tobacco Comments    last cigarette on 12/16/2020   Counseling given: Not Answered Tobacco comments:  last cigarette on 12/16/2020   Outpatient Medications Prior to Visit  Medication Sig Dispense Refill   albuterol (PROVENTIL) (2.5 MG/3ML) 0.083% nebulizer solution Take 2.5 mg by nebulization every 4 (four) hours as needed for wheezing or shortness of breath.     albuterol (VENTOLIN HFA) 108 (90 Base) MCG/ACT inhaler Inhale 2 puffs into the lungs every 4 (four) hours as needed for wheezing or shortness of breath.     aspirin 81 MG chewable tablet Chew 1 tablet (81 mg total) by mouth daily. 30 tablet 0   atorvastatin (LIPITOR) 80 MG tablet Take 1 tablet (80 mg total) by mouth daily. 30 tablet 0   Budeson-Glycopyrrol-Formoterol (BREZTRI AEROSPHERE) 160-9-4.8 MCG/ACT  AERO Inhale 2 puffs into the lungs in the morning and at bedtime. 4.8 g 6   Dulaglutide (TRULICITY) A999333 0000000 SOPN Inject 0.75 mg into the skin every Sunday.     DULoxetine (CYMBALTA) 30 MG capsule Take 30 mg by mouth 2 (two) times daily.     gabapentin (NEURONTIN) 300 MG capsule Take 300 mg by mouth 3 (three) times daily.      glipiZIDE (GLUCOTROL XL) 10 MG 24 hr tablet Take 10 mg by mouth daily with breakfast.      metFORMIN (GLUCOPHAGE) 1000 MG tablet Take 1,000 mg by mouth 2 (two) times daily with a meal.      montelukast (SINGULAIR) 10 MG tablet Take 1 tablet (10 mg total) by mouth at bedtime. 30 tablet 11   Oxycodone HCl 10 MG TABS Take 10 mg by mouth every 4 (four) hours as needed.     tiZANidine (ZANAFLEX) 4 MG tablet Take 4 mg by  mouth every 8 (eight) hours as needed.     topiramate (TOPAMAX) 50 MG tablet Take 50 mg by mouth daily.     traZODone (DESYREL) 50 MG tablet Take 50 mg by mouth at bedtime.     triamterene-hydrochlorothiazide (MAXZIDE-25) 37.5-25 MG tablet Take 1 tablet by mouth daily.     valsartan (DIOVAN) 80 MG tablet Take 80 mg by mouth daily.      BEVESPI AEROSPHERE 9-4.8 MCG/ACT AERO SMARTSIG:2 Puff(s) By Mouth Twice Daily     No facility-administered medications prior to visit.    Review of Systems  Review of Systems  Constitutional: Negative.   HENT: Negative.    Respiratory:  Negative for cough, chest tightness, shortness of breath and wheezing.   Cardiovascular: Negative.   Psychiatric/Behavioral: Negative.      Physical Exam  BP 138/90 (BP Location: Left Arm, Patient Position: Sitting, Cuff Size: Large)   Pulse 77   Temp (!) 97.5 F (36.4 C) (Oral)   Ht '5\' 2"'$  (1.575 m)   Wt 260 lb 3.2 oz (118 kg)   LMP 10/15/2018 Comment: patient had pregnancy test with PCP (test was negative)  SpO2 100%   BMI 47.59 kg/m  Physical Exam Constitutional:      General: She is not in acute distress.    Appearance: Normal appearance. She is obese. She is not  ill-appearing.  HENT:     Head: Normocephalic and atraumatic.     Mouth/Throat:     Mouth: Mucous membranes are moist.     Pharynx: Oropharynx is clear.  Cardiovascular:     Rate and Rhythm: Normal rate and regular rhythm.  Pulmonary:     Effort: Pulmonary effort is normal.     Breath sounds: No wheezing, rhonchi or rales.  Musculoskeletal:        General: Normal range of motion.  Skin:    General: Skin is warm and dry.  Neurological:     General: No focal deficit present.     Mental Status: She is alert and oriented to person, place, and time. Mental status is at baseline.  Psychiatric:        Mood and Affect: Mood normal.        Behavior: Behavior normal.        Thought Content: Thought content normal.        Judgment: Judgment normal.     Lab Results:  CBC    Component Value Date/Time   WBC 8.8 04/03/2021 0251   RBC 3.63 (L) 04/03/2021 0251   HGB 11.8 (L) 04/03/2021 0251   HGB 14.3 08/16/2019 0929   HCT 34.5 (L) 04/03/2021 0251   HCT 43.1 08/16/2019 0929   PLT 175 04/03/2021 0251   PLT 194 08/16/2019 0929   MCV 95.0 04/03/2021 0251   MCV 95 08/16/2019 0929   MCH 32.5 04/03/2021 0251   MCHC 34.2 04/03/2021 0251   RDW 13.8 04/03/2021 0251   RDW 12.7 08/16/2019 0929   LYMPHSABS 12.7 (H) 03/28/2021 0653   LYMPHSABS 2.7 08/16/2019 0929   MONOABS 1.6 (H) 03/28/2021 0653   EOSABS 0.2 03/28/2021 0653   EOSABS 0.1 08/16/2019 0929   BASOSABS 0.1 03/28/2021 0653   BASOSABS 0.0 08/16/2019 0929    BMET    Component Value Date/Time   NA 138 04/02/2021 0548   NA 141 07/23/2020 0945   K 3.9 04/02/2021 0548   CL 103 04/02/2021 0548   CO2 28 04/02/2021 0548   GLUCOSE 128 (H) 04/02/2021 0548  BUN 8 04/02/2021 0548   BUN 14 07/23/2020 0945   CREATININE 0.58 04/02/2021 0548   CREATININE 0.62 10/09/2016 0857   CALCIUM 9.2 04/02/2021 0548   GFRNONAA >60 04/02/2021 0548   GFRNONAA 69 06/19/2015 1150   GFRAA 113 07/23/2020 0945   GFRAA 80 06/19/2015 1150    BNP     Component Value Date/Time   BNP 43.4 03/28/2021 0653   BNP 15.2 09/18/2016 1058    ProBNP    Component Value Date/Time   PROBNP 16.0 06/21/2020 1004    Imaging: DG Chest 2 View  Result Date: 04/23/2021 CLINICAL DATA:  Community acquired pneumonia. EXAM: CHEST - 2 VIEW COMPARISON:  CT 04/02/2021.  Radiographs 03/29/2021 and 03/28/2021. FINDINGS: The heart size and mediastinal contours are stable. The overall pulmonary aeration has improved compared with the prior radiographs. There is mild linear atelectasis or scarring at both lung bases, but no consolidation or significant pleural effusion. There is no pneumothorax. Mild degenerative changes are present within the thoracic spine. IMPRESSION: Mild bibasilar linear scarring or atelectasis. No focal airspace disease. Electronically Signed   By: Richardean Sale M.D.   On: 04/23/2021 16:28   CT HEAD WO CONTRAST  Result Date: 03/28/2021 CLINICAL DATA:  Mental status change EXAM: CT HEAD WITHOUT CONTRAST TECHNIQUE: Contiguous axial images were obtained from the base of the skull through the vertex without intravenous contrast. COMPARISON:  09/20/2018 FINDINGS: Brain: There is no acute intracranial hemorrhage, mass effect, or edema. Gray-white differentiation is preserved. There is no extra-axial fluid collection. Ventricles and sulci are within normal limits in size and configuration. Vascular: No hyperdense vessel or unexpected calcification. Skull: Calvarium is unremarkable. Sinuses/Orbits: Likely odontogenic mild right maxillary sinus mucosal thickening with posterior right maxillary molar periapical lucencies. Patchy ethmoid mucosal thickening. Nasal cavity opacification. No acute orbital abnormality. Other: Question periorbital soft tissue swelling. Mastoid air cells are clear. IMPRESSION: No acute intracranial abnormality. Question bilateral periorbital soft tissue swelling. Electronically Signed   By: Macy Mis M.D.   On: 03/28/2021 09:07    CT Angio Chest Pulmonary Embolism (PE) W or WO Contrast  Result Date: 04/02/2021 CLINICAL DATA:  High probability of pulmonary embolus. EXAM: CT ANGIOGRAPHY CHEST WITH CONTRAST TECHNIQUE: Multidetector CT imaging of the chest was performed using the standard protocol during bolus administration of intravenous contrast. Multiplanar CT image reconstructions and MIPs were obtained to evaluate the vascular anatomy. CONTRAST:  75m OMNIPAQUE IOHEXOL 350 MG/ML SOLN COMPARISON:  August 28, 2019. FINDINGS: Cardiovascular: Satisfactory opacification of the pulmonary arteries to the segmental level. No evidence of pulmonary embolism. Mild cardiomegaly is noted. No pericardial effusion. Mediastinum/Nodes: No enlarged mediastinal, hilar, or axillary lymph nodes. Thyroid gland, trachea, and esophagus demonstrate no significant findings. Lungs/Pleura: No pneumothorax or pleural effusion is noted. Mild bilateral posterior basilar subsegmental atelectasis is noted. Upper Abdomen: Hepatic steatosis. Musculoskeletal: No chest wall abnormality. No acute or significant osseous findings. Review of the MIP images confirms the above findings. IMPRESSION: No definite evidence of pulmonary embolus. Mild bilateral posterior basilar subsegmental atelectasis. Hepatic steatosis. Electronically Signed   By: JMarijo ConceptionM.D.   On: 04/02/2021 10:44   CT CERVICAL SPINE WO CONTRAST  Result Date: 03/28/2021 CLINICAL DATA:  Unresponsive EXAM: CT CERVICAL SPINE WITHOUT CONTRAST TECHNIQUE: Multidetector CT imaging of the cervical spine was performed without intravenous contrast. Multiplanar CT image reconstructions were also generated. COMPARISON:  None. FINDINGS: Alignment: Preserved. Skull base and vertebrae: No acute cervical spine fracture. Vertebral body heights are maintained. Bulky anterior osteophytes.  Soft tissues and spinal canal: No prevertebral fluid or swelling. No visible canal hematoma. Disc levels:  Mild degenerative  changes are present. Upper chest: No acute abnormality. Other: Trace calcified plaque at the left common carotid bifurcation. IMPRESSION: No acute cervical spine fracture. Electronically Signed   By: Macy Mis M.D.   On: 03/28/2021 09:11   MR BRAIN W WO CONTRAST  Result Date: 03/29/2021 CLINICAL DATA:  Seizure nontraumatic. EXAM: MRI HEAD WITHOUT AND WITH CONTRAST TECHNIQUE: Multiplanar, multiecho pulse sequences of the brain and surrounding structures were obtained without and with intravenous contrast. CONTRAST:  44m GADAVIST GADOBUTROL 1 MMOL/ML IV SOLN COMPARISON:  CT head 03/28/2021 FINDINGS: Brain: No acute infarction, hemorrhage, hydrocephalus, extra-axial collection or mass lesion. Normal cerebral white matter. Normal temporal lobe anatomy and signal. Normal enhancement following contrast infusion. Vascular: Normal arterial flow voids. Skull and upper cervical spine: Negative Sinuses/Orbits: Mucosal edema paranasal sinuses. Air-fluid levels in the sphenoid sinus. Mild mastoid effusion bilaterally Other: None IMPRESSION: Normal MRI brain with contrast Mucosal edema in the paranasal sinuses and mastoid sinus bilaterally. Electronically Signed   By: CFranchot GalloM.D.   On: 03/29/2021 12:54   CT CORONARY MORPH W/CTA COR W/SCORE W/CA W/CM &/OR WO/CM  Addendum Date: 04/01/2021   ADDENDUM REPORT: 04/01/2021 18:13 ADDENDUM: These results were called by telephone at the time of interpretation on 04/01/2021 at 6:13 pm to provider [facility] , who verbally acknowledged these results. Electronically Signed   By: AMarkus DaftM.D.   On: 04/01/2021 18:13   Addendum Date: 04/01/2021   ADDENDUM REPORT: 04/01/2021 18:04 EXAM: OVER-READ INTERPRETATION  CT CHEST The following report is an over-read performed by radiologist Dr. AEstanislado PandyGGrand [facility], [facility] on 04/01/2021. This over-read does not include interpretation of cardiac or coronary anatomy or pathology. The coronary CTA interpretation by the  cardiologist is attached. COMPARISON:  Chest CT 03/29/2021 FINDINGS: Questionable filling defect involving a right lower lobe pulmonary artery on sequence 11, image 19. This does not confidently represent pulmonary emboli due to artifact in this area. There is another questionable area in the right lower lobe on image 22 along the medial aspect. Main pulmonary arteries appear to be patent. Visualized mediastinal structures are unremarkable. Imaging of the upper abdomen demonstrates an enlarged left hepatic lobe. Suspect hepatic steatosis. Again noted is mild fullness of the left adrenal gland that is incompletely evaluated. Again noted is volume loss in the medial lower lobes. No significant pleural fluid. Study has limitations from motion artifact. Bridging osteophytes in the visualized thoracic spine. IMPRESSION: 1. Potential filling defects involving pulmonary arteries in the right lower lobe. However, this area has limited evaluation due to motion artifact and poor opacification of the pulmonary arteries. This finding is NOT definitive and recommend a dedicated Chest CTA to evaluate for pulmonary embolism. 2. Persistent volume loss in the medial lower lobes. 3. Enlargement of left hepatic lobe with low-density. Findings are concerning for hepatic steatosis and cirrhosis. Electronically Signed   By: AMarkus DaftM.D.   On: 04/01/2021 18:04   Result Date: 04/01/2021 EXAM: Cardiac/Coronary  CT TECHNIQUE: The patient was scanned on a [employer] FINDINGS: A 120 kV prospective scan was triggered in the descending thoracic aorta at 111 HU's. Axial non-contrast 3 mm slices were carried out through the heart. The data set was analyzed on a dedicated work station and scored using the [employer] rotation speed was 250 msecs and collimation was .6 mm. No beta blockade and 0.8 mg  of sl NTG was given. The 3D data set was reconstructed in 5% intervals of the 67-82 % of the R-R cycle. Diastolic phases  were analyzed on a dedicated work station using MPR, MIP and VRT modes. The patient received 80 cc of contrast. Study Quality poor and limited by Noise and stitch artifact. Aorta:  Normal size.  No calcifications.  No dissection. Aortic Valve:  Trileaflet.  No calcifications. Coronary Arteries:  Normal coronary origin.  Right dominance. RCA is a large dominant artery that gives rise to PDA and PLVB. There is no plaque. Left main is a large artery that gives rise to LAD and LCX arteries. There is no plaque. LAD is a large vessel that gives rise to 2 diagonals. Cannot rule out obstructive disease in the distal LAD which is not adequately visualized. No obvious calcified plaque. LCX is a non-dominant artery that gives rise to one large OM1 branch. Possible high grade non calcified plaque in the mid LCx with associated stenosis of 70-99% but may be due to stitch artifact. Other findings: Normal pulmonary vein drainage into the left atrium. Normal let atrial appendage without a thrombus. Normal size of the pulmonary artery. IMPRESSION: 1. Coronary calcium score of 0. This was 0 percentile for age and sex matched control. 2.  Normal coronary origin with right dominance. 3. No evidence of calcified plaque. Poor quality study for non calcified plaque with possible high grade soft plaque in the mid LCx. The distal LAD is not visualized. 4. The study was not submitted for FFR analysis due to poor quality and significant stitch artifact. 5. Recommend cardiac cath or nuclear stress testing if clinical suspicion high. Fransico Him Electronically Signed: By: Fransico Him On: 04/01/2021 16:13   DG CHEST PORT 1 VIEW  Result Date: 03/29/2021 CLINICAL DATA:  50 year old female central line placement. EXAM: PORTABLE CHEST 1 VIEW COMPARISON:  CT Chest, Abdomen, and Pelvis today 0113 hours. Portable chest 03/28/2021. FINDINGS: Portable AP semi upright view at 0416 hours. Left subclavian approach central line in place, tip is below  the carina at the level of the lower SVC. Otherwise stable lines and tubes. Note that the enteric tube side hole is at the level of the gastroesophageal junction. No pneumothorax. Lower lung volumes since yesterday. Stable cardiac size and mediastinal contours. Confluent lower lobe opacity better demonstrated by CT this morning. Paucity of bowel gas in the upper abdomen. IMPRESSION: 1. Left subclavian central line placed, tip at the lower SVC level. No pneumothorax. 2. Enteric tube side hole at the GEJ level. Advanced 6 cm to ensure side hole placement within the stomach. Electronically Signed   By: Genevie Ann M.D.   On: 03/29/2021 04:32   DG Chest Port 1 View  Result Date: 03/28/2021 CLINICAL DATA:  50 year old female found unresponsive. Status post endotracheal tube placement. EXAM: PORTABLE CHEST 1 VIEW COMPARISON:  Chest x-ray 03/12/2021. FINDINGS: An endotracheal tube is in place with tip 4.3 cm above the carina. A nasogastric tube is seen extending into the stomach, however, the tip of the nasogastric tube extends below the lower margin of the image. Lung volumes are low. No consolidative airspace disease. No pleural effusions. No pneumothorax. No pulmonary nodule or mass noted. Pulmonary vasculature and the cardiomediastinal silhouette are within normal limits. IMPRESSION: 1. Support apparatus, as above. 2. Low lung volumes without radiographic evidence of acute cardiopulmonary disease. Electronically Signed   By: Vinnie Langton M.D.   On: 03/28/2021 07:27   EEG adult  Result  Date: 03/28/2021 Lora Havens, MD     03/28/2021  2:08 PM Patient Name: AHLEENA SONN MRN: [id number] Epilepsy Attending: Lora Havens Referring Physician/Provider: Dr Lesleigh Noe Date: 03/28/2021 Duration: 23.03 mins Patient history: a 50 year old female who presents with unresponsiveness. EEG to evaluate for seizure Level of alertness: lethargic AEDs during EEG study:  None Technical aspects: This EEG study was  done with scalp electrodes positioned according to the 10-20 International system of electrode placement. Electrical activity was acquired at a sampling rate of '500Hz'$  and reviewed with a high frequency filter of '70Hz'$  and a low frequency filter of '1Hz'$ . EEG data were recorded continuously and digitally stored. Description: No clear posterior dominant rhythm was seen. EEG showed continuous generalized 5-'9hz'$  theta-alpha as well as intermittent generalized 2-'3hz'$  delta slowing. Hyperventilation and photic stimulation were not performed.   ABNORMALITY - Continuous slow, generalized IMPRESSION: This study is suggestive of moderate diffuse encephalopathy, nonspecific etiology but likely related to sedation. No seizures or epileptiform discharges were seen throughout the recording. Lora Havens   ECHOCARDIOGRAM COMPLETE  Result Date: 03/29/2021    ECHOCARDIOGRAM REPORT   Patient Name:   ZAVIERA SIDDIQUI Date of Exam: 03/29/2021 Medical Rec #:  [id number]            Height:       62.0 in Accession #:    [id number]           Weight:       382.9 lb Date of Birth:  04/25/1971            BSA:          2.519 m Patient Age:    81 years             BP:           105/66 mmHg Patient Gender: F                    HR:           82 bpm. Exam Location:  Inpatient Procedure: 2D Echo Indications:    acute ischemic heart disease  History:        Patient has prior history of Echocardiogram examinations, most                 recent 04/14/2018. Signs/Symptoms:elevated troponin.  Sonographer:    Johny Chess Referring Phys: [id number] Frederik Pear  Sonographer Comments: Echo performed with patient supine and on artificial respirator. Image acquisition challenging due to patient body habitus. IMPRESSIONS  1. Left ventricular ejection fraction, by estimation, is 60 to 65%. The left ventricle has normal function. The left ventricle has no regional wall motion abnormalities. Left ventricular diastolic parameters were normal.  2. Right  ventricular systolic function is normal. The right ventricular size is normal.  3. The mitral valve is normal in structure. No evidence of mitral valve regurgitation. No evidence of mitral stenosis.  4. The aortic valve is normal in structure. Aortic valve regurgitation is not visualized. No aortic stenosis is present.  5. The inferior vena cava is normal in size with greater than 50% respiratory variability, suggesting right atrial pressure of 3 mmHg. Comparison(s): Prior images unable to be directly viewed, comparison made by report only. FINDINGS  Left Ventricle: Left ventricular ejection fraction, by estimation, is 60 to 65%. The left ventricle has normal function. The left ventricle has no regional wall motion abnormalities. The left ventricular internal cavity size was normal  in size. There is  no left ventricular hypertrophy. Left ventricular diastolic parameters were normal. Right Ventricle: The right ventricular size is normal. No increase in right ventricular wall thickness. Right ventricular systolic function is normal. Left Atrium: Left atrial size was normal in size. Right Atrium: Right atrial size was normal in size. Pericardium: There is no evidence of pericardial effusion. Mitral Valve: The mitral valve is normal in structure. No evidence of mitral valve regurgitation. No evidence of mitral valve stenosis. Tricuspid Valve: The tricuspid valve is normal in structure. Tricuspid valve regurgitation is not demonstrated. No evidence of tricuspid stenosis. Aortic Valve: The aortic valve is normal in structure. Aortic valve regurgitation is not visualized. No aortic stenosis is present. Aortic valve mean gradient measures 8.0 mmHg. Aortic valve peak gradient measures 15.1 mmHg. Aortic valve area, by VTI measures 1.92 cm. Pulmonic Valve: The pulmonic valve was normal in structure. Pulmonic valve regurgitation is not visualized. No evidence of pulmonic stenosis. Aorta: The aortic root is normal in size and  structure. Venous: The inferior vena cava is normal in size with greater than 50% respiratory variability, suggesting right atrial pressure of 3 mmHg. IAS/Shunts: No atrial level shunt detected by color flow Doppler.  LEFT VENTRICLE PLAX 2D LVIDd:         4.60 cm  Diastology LVIDs:         2.90 cm  LV e' medial:    7.72 cm/s LV PW:         1.10 cm  LV E/e' medial:  11.4 LV IVS:        1.05 cm  LV e' lateral:   6.64 cm/s LVOT diam:     1.90 cm  LV E/e' lateral: 13.3 LV SV:         64 LV SV Index:   25 LVOT Area:     2.84 cm  RIGHT VENTRICLE RV S prime:     17.80 cm/s TAPSE (M-mode): 2.3 cm LEFT ATRIUM             Index       RIGHT ATRIUM           Index LA diam:        4.00 cm 1.59 cm/m  RA Area:     18.80 cm LA Vol (A2C):   51.0 ml 20.24 ml/m RA Volume:   52.90 ml  21.00 ml/m LA Vol (A4C):   54.3 ml 21.55 ml/m LA Biplane Vol: 52.4 ml 20.80 ml/m  AORTIC VALVE AV Area (Vmax):    2.13 cm AV Area (Vmean):   1.95 cm AV Area (VTI):     1.92 cm AV Vmax:           194.00 cm/s AV Vmean:          132.000 cm/s AV VTI:            0.332 m AV Peak Grad:      15.1 mmHg AV Mean Grad:      8.0 mmHg LVOT Vmax:         146.00 cm/s LVOT Vmean:        90.600 cm/s LVOT VTI:          0.225 m LVOT/AV VTI ratio: 0.68  AORTA Ao Root diam: 2.80 cm Ao Asc diam:  2.90 cm MITRAL VALVE MV Area (PHT): 2.83 cm    SHUNTS MV Decel Time: 268 msec    Systemic VTI:  0.22 m MV E velocity: 88.10 cm/s  Systemic  Diam: 1.90 cm MV A velocity: 78.80 cm/s MV E/A ratio:  1.12 Mihai Croitoru MD Electronically signed by Sanda Klein MD Signature Date/Time: 03/29/2021/1:45:51 PM    Final    [place] EKG SITE RITE  Result Date: 03/28/2021 If Site Rite image not attached, placement could not be confirmed due to current cardiac rhythm.  CT CHEST ABDOMEN PELVIS WO CONTRAST  Result Date: 03/29/2021 CLINICAL DATA:  Sepsis EXAM: CT CHEST, ABDOMEN AND PELVIS WITHOUT CONTRAST TECHNIQUE: Multidetector CT imaging of the chest, abdomen and pelvis was performed  following the standard protocol without IV contrast. COMPARISON:  Chest radiograph dated 03/28/2021. CT abdomen/pelvis dated 04/26/2020. CTA chest dated 08/28/2019. FINDINGS: CT CHEST FINDINGS Cardiovascular: The heart is normal in size. No pericardial effusion. No evidence of thoracic aortic aneurysm. Mediastinum/Nodes: No suspicious mediastinal lymphadenopathy. Visualized thyroid is unremarkable. Lungs/Pleura: Endotracheal tube terminates 2.2 cm above the carina. Patchy dependent opacities in the bilateral lower lobes, favoring atelectasis. Additional mild dependent atelectasis in the bilateral upper lobes. Evaluation of the lung parenchyma is constrained by respiratory motion. Within that constraint, there are no suspicious pulmonary nodules. No focal consolidation. No pleural effusion or pneumothorax. Musculoskeletal: Degenerative changes of the thoracic spine. CT ABDOMEN PELVIS FINDINGS Hepatobiliary: Unenhanced liver is unremarkable. Status post cholecystectomy. No intrahepatic or extrahepatic ductal dilatation. Pancreas: Within normal limits. Spleen: Within normal limits. Adrenals/Urinary Tract: Adrenal glands are within normal limits. Excretory contrast in the bilateral renal collecting systems related to recent CTA. Kidneys otherwise within normal limits. No hydronephrosis. Excretory contrast in the bladder. Stomach/Bowel: Enteric tube terminates in the gastric cardia, just distal to the GE junction. No evidence of bowel obstruction. Normal appendix (series 3/image 92). No colonic wall thickening or inflammatory changes. Vascular/Lymphatic: No evidence of abdominal aortic aneurysm. No suspicious abdominopelvic lymphadenopathy. Reproductive: Uterus is within normal limits. Bilateral ovaries are within normal limits. Other: No abdominopelvic ascites. Musculoskeletal: Status post PLIF at L5-S1. Degenerative changes of the lumbar spine. IMPRESSION: Endotracheal tube terminates 2.2 cm above the carina. Enteric  tube terminates in the gastric cardia, just below the GE junction. Patchy/dependent opacities in the posterior upper and lower lobes, favoring atelectasis. No acute findings in the abdomen/pelvis. Electronically Signed   By: Julian Hy M.D.   On: 03/29/2021 02:19   CT ANGIO HEAD NECK W WO CM (CODE STROKE)  Result Date: 03/28/2021 CLINICAL DATA:  Unresponsive EXAM: CT ANGIOGRAPHY HEAD AND NECK TECHNIQUE: Multidetector CT imaging of the head and neck was performed using the standard protocol during bolus administration of intravenous contrast. Multiplanar CT image reconstructions and MIPs were obtained to evaluate the vascular anatomy. Carotid stenosis measurements (when applicable) are obtained utilizing NASCET criteria, using the distal internal carotid diameter as the denominator. CONTRAST:  100m OMNIPAQUE IOHEXOL 350 MG/ML SOLN COMPARISON:  None. FINDINGS: CTA NECK Aortic arch: Great vessel origins are patent. Right carotid system: Patent. Common carotid is partially obscured by adjacent venous artifact. No stenosis. Left carotid system: Patent. Minor plaque along the distal common carotid. No stenosis. Vertebral arteries: Patent and codominant. Skeleton: Cervical spine dictated previously. Other neck: Enteric and endotracheal tubes are present. Upper chest: No apical lung mass. Review of the MIP images confirms the above findings CTA HEAD Anterior circulation: Intracranial internal carotid arteries are patent. Anterior cerebral arteries are patent. Left A1 ACA is dominant. Anterior communicating artery is present. Middle cerebral arteries are patent. Posterior circulation: Intracranial vertebral arteries are patent. Basilar artery is patent. Major cerebellar artery origins are patent. Bilateral posterior communicating arteries are present. There  is fetal origin of the right PCA. Posterior cerebral arteries are patent. Venous sinuses: Patent as allowed by contrast bolus timing. Review of the MIP images  confirms the above findings IMPRESSION: No large vessel occlusion, hemodynamically significant stenosis, or evidence of dissection. Electronically Signed   By: Macy Mis M.D.   On: 03/28/2021 10:14     Assessment & Plan:   OSA (obstructive sleep apnea) - Patient is compliant with BIPAP, there was gap d/t recent hospitalization.  [facility] download 03/23/21-04/21/21 showed 77% compliance; pressure 15/5cm h20; residual AHI 4.3. Her pressure setting is higher than it was during her last visit, there is no mention of our office changing these settings. Her previous setting was 11/8. Spoke with Dr. [name] and he recommended pressure setting 15/8cm h20. We will place DME order and set her up for 1 month televisit to review download   Acute respiratory failure [facility]) - Patient was admitted in July 2022 for acute hypercapnic and hypoxic respiratory failure requiring intubation and mechanical ventilation. She was given Narcan by EMS. UDS was positive for cannabis. CTA on 04/02/2021 showed no evidence of pulmonary embolism, mild bilateral posterior basilar subsegmental atelectasis.  CAP (community acquired pneumonia) - CTA on 04/02/2021 showed no evidence of pulmonary embolism, mild bilateral posterior basilar subsegmental atelectasis. Treated for suspected pneumonia with ceftriaxone, vancomycin. - Repeat CXR today showed improved aeration, mild linear atelectasis or scarring lung bases. NO consolidation.   COPD mixed type ([place]) - Stable; No acute symptoms. Decreased SABA used since she quit smoking - Continue Breztri two puffs morning and evening; prn albuterol q 6 hours for breakthrough symptoms - Continue Singulair '10mg'$  at bedtime   Martyn Ehrich, NP 04/25/2021

## 2021-04-23 NOTE — Telephone Encounter (Signed)
-----   Message from Rigoberto Noel, MD sent at 04/23/2021  2:37 PM EDT ----- Regarding: RE: BIPAP, resp failure Seems like someone thought that she would do better with a higher IPAP I would change her to 15/8, then reassess with a download in 1 month   ----- Message ----- From: Martyn Ehrich, NP Sent: 04/23/2021   2:29 PM EDT To: Rigoberto Noel, MD Subject: RE: BIPAP, resp failure                        Absolutely no idea  ----- Message ----- From: Rigoberto Noel, MD Sent: 04/23/2021  12:30 PM EDT To: Martyn Ehrich, NP Subject: RE: BIPAP, resp failure                        We had her on 11/8 ANy idea why  settings were changed ?  RA ----- Message ----- From: Martyn Ehrich, NP Sent: 04/23/2021   9:39 AM EDT To: Rigoberto Noel, MD Subject: BIPAP, resp failure                            Patient of yours recently admitted for hypercapnic/ hypoxic respiratory failure. Needed narcan and intubation. UDS was pos for cannabis. Treated for PNA, CXR showed atelectasis. NSTEMI felt to be demand. Coronary CT was limited d/t body habitus but was unremarkable for acute findings.   Current BIPAP pressure is 15/5cm h20 with PS 3; Residual AHI 4.3. Seems like her pressure setting has changed from when you last saw her. Are you ok with theses current settings?   -Beth

## 2021-04-24 NOTE — Telephone Encounter (Signed)
Called and spoke with pt letting her know the info stated by [facility] and she verbalized understanding. Appt has been scheduled for pt for a televisit in 1 month and order has also been placed for settings to be changed. Nothing further needed.

## 2021-04-25 ENCOUNTER — Encounter: Payer: Self-pay | Admitting: Primary Care

## 2021-04-25 DIAGNOSIS — J189 Pneumonia, unspecified organism: Secondary | ICD-10-CM | POA: Insufficient documentation

## 2021-04-25 NOTE — Assessment & Plan Note (Signed)
-   CTA on 04/02/2021 showed no evidence of pulmonary embolism, mild bilateral posterior basilar subsegmental atelectasis. Treated for suspected pneumonia with ceftriaxone, vancomycin. - Repeat CXR today showed improved aeration, mild linear atelectasis or scarring lung bases. NO consolidation.

## 2021-04-25 NOTE — Progress Notes (Signed)
Please let patient know her CXR looks improved. Mild atelectasis or scarring at lung bases but no evidence of pneumonia or pleural effusion. No pneumothorax. Mild degenerative changes of thoracic spine

## 2021-04-25 NOTE — Assessment & Plan Note (Signed)
-   Stable; No acute symptoms. Decreased SABA used since she quit smoking - Continue Breztri two puffs morning and evening; prn albuterol q 6 hours for breakthrough symptoms - Continue Singulair '10mg'$  at bedtime

## 2021-04-25 NOTE — Assessment & Plan Note (Signed)
-   Patient was admitted in July 2022 for acute hypercapnic and hypoxic respiratory failure requiring intubation and mechanical ventilation. She was given Narcan by EMS. UDS was positive for cannabis. CTA on 04/02/2021 showed no evidence of pulmonary embolism, mild bilateral posterior basilar subsegmental atelectasis.

## 2021-04-25 NOTE — Assessment & Plan Note (Deleted)
-   Stable; No acute symptoms. Decreased SABA used since she quit smoking - Continue Breztri two puffs morning and evening; prn albuterol q 6 hours for breakthrough symptoms - Continue Singulair '10mg'$  at bedtime  Orders: - CXR today re: CAP   Follow-up: -3 months with Dr. [name]

## 2021-04-29 ENCOUNTER — Telehealth: Payer: Self-pay | Admitting: Pulmonary Disease

## 2021-04-29 NOTE — Telephone Encounter (Signed)
Pt is returning phone call in regards to chest x-ray results. Pls regard; [phone].

## 2021-04-29 NOTE — Telephone Encounter (Signed)
   Patient called office, confirmed DOB. Made aware of CXR results per EW. Voiced understanding.   Nothing further needed at this time.

## 2021-05-06 NOTE — Progress Notes (Deleted)
Office Visit    Patient Name: Lori Bean Date of Encounter: 05/06/2021  PCP:  [name], PA   [place]  Cardiologist:  [name], MD  Advanced Practice Provider:  No care team member to display Electrophysiologist:  None     Chief Complaint    Lori Bean is a 50 y.o. female with a hx of chronic diastolic heart failure, hypertensive heart disease, asthma, OSA on CPAP, lymphedema, schizophrenia, bipolar disorder, obesity, prior PE presents today for hospital follow-up  Past Medical History    Past Medical History:  Diagnosis Date   Abdominal hernia    Anginal pain ([place])    occ from asthma   Anxiety    Arthritis    Asthma    Bipolar affective disorder ([place])    Schizophrenia   Bronchitis    hx of   CHF (congestive heart failure) ([place])    Depression    Diabetes mellitus without complication ([place])    " New Onset " per patient   GERD (gastroesophageal reflux disease)    Heart murmur    Hypertension    takes meds   Lymphedema 06/07/2018   Morbid (severe) obesity due to excess calories ([facility])    Neuropathy    left leg , "from back surgery"   Overactive bladder    Schizophrenia ([place])    Sciatica    Shortness of breath    Sleep apnea    Snoring disorder    Pt stated my boyfriend always wakes me up and tells me to breathe   Past Surgical History:  Procedure Laterality Date   BACK SURGERY     3 back surgeries   CHOLECYSTECTOMY     COLONOSCOPY WITH PROPOFOL N/A 01/23/2016   Procedure: COLONOSCOPY WITH PROPOFOL;  Surgeon: Wonda Horner, MD;  Location: [facility];  Service: Endoscopy;  Laterality: N/A;   EYE SURGERY     Metal plate in right eye. Had fracture in right eye   gallstones reomved     KNEE ARTHROSCOPY WITH MENISCAL REPAIR Right 09/28/2016   Procedure: KNEE ARTHROSCOPY WITH MENISCAL REPAIR;  Surgeon: Dorna Leitz, MD;  Location: [place];  Service: Orthopedics;  Laterality: Right;  Right partial  meniscectomy and chondroplasty, patellar/femoral joint and medial femoral condyle    LUMBAR LAMINECTOMY/DECOMPRESSION MICRODISCECTOMY  04/01/2012   Procedure: LUMBAR LAMINECTOMY/DECOMPRESSION MICRODISCECTOMY 2 LEVELS;  Surgeon: Faythe Ghee, MD;  Location: [facility];  Service: Neurosurgery;  Laterality: Left;  Lumbar four-five, lumbar five sacral one microdiscectomy    LUMBAR WOUND DEBRIDEMENT  04/29/2012   Procedure: LUMBAR WOUND DEBRIDEMENT;  Surgeon: Faythe Ghee, MD;  Location: [place] [facility];  Service: Neurosurgery;  Laterality: N/A;  lumbar wound debridement   ROTATOR CUFF REPAIR     Right shoulder   TUBAL LIGATION      Allergies  No Active Allergies  History of Present Illness    Lori Bean is a 50 y.o. female with a hx of chronic diastolic heart failure, hypertensive heart disease, asthma, OSA on CPAP, lymphedema, schizophrenia, bipolar disorder, obesity, prior PE last seen while hospitalized.  She was admitted 03/28/21 with acute hypercarbic respiratory failure requiring intubation.  Cardiology was consulted for elevated troponin.  Cardiac CT which was less than ideal given body habitus though coronary calcium score of 0.  Troponin was deemed demand ischemia and no further ischemic evaluation warranted.  Her home antihypertensive regimen was resumed.  She presents today for follow-up. ***  EKGs/Labs/Other Studies Reviewed:   The following studies were reviewed today: CT coronary study 04/01/21:   IMPRESSION: 1. Coronary calcium score of 0. This was 0 percentile for age and sex matched control.   2.  Normal coronary origin with right dominance.   3. No evidence of calcified plaque. Poor quality study for non calcified plaque with possible high grade soft plaque in the mid LCx. The distal LAD is not visualized.   4. The study was not submitted for FFR analysis due to poor quality and significant stitch artifact.   5. Recommend cardiac cath or nuclear  stress testing if clinical suspicion high.     Echo from 03/29/21:    1. Left ventricular ejection fraction, by estimation, is 60 to 65%. The  left ventricle has normal function. The left ventricle has no regional  wall motion abnormalities. Left ventricular diastolic parameters were  normal.   2. Right ventricular systolic function is normal. The right ventricular  size is normal.   3. The mitral valve is normal in structure. No evidence of mitral valve  regurgitation. No evidence of mitral stenosis.   4. The aortic valve is normal in structure. Aortic valve regurgitation is  not visualized. No aortic stenosis is present.   5. The inferior vena cava is normal in size with greater than 50%  respiratory variability, suggesting right atrial pressure of 3 mmHg.   Comparison(s): Prior images unable to be directly viewed, comparison made  by report only.   EKG:  EKG is *** ordered today.  The ekg ordered today demonstrates ***  Recent Labs: 06/21/2020: Pro B Natriuretic peptide (BNP) 16.0 03/28/2021: B Natriuretic Peptide 43.4; Magnesium 2.1; TSH 0.634 04/02/2021: ALT 54; BUN 8; Creatinine, Ser 0.58; Potassium 3.9; Sodium 138 04/03/2021: Hemoglobin 11.8; Platelets 175  Recent Lipid Panel    Component Value Date/Time   CHOL 141 04/02/2021 0548   CHOL 148 06/07/2018 1024   TRIG 135 04/02/2021 0548   HDL 48 04/02/2021 0548   HDL 51 06/07/2018 1024   CHOLHDL 2.9 04/02/2021 0548   VLDL 27 04/02/2021 0548   LDLCALC 66 04/02/2021 0548   LDLCALC 76 06/07/2018 1024    Home Medications   No outpatient medications have been marked as taking for the 05/07/21 encounter (Appointment) with Loel Dubonnet, NP.     Review of Systems      All other systems reviewed and are otherwise negative except as noted above.  Physical Exam    VS:  LMP 10/15/2018 Comment: patient had pregnancy test with PCP (test was negative) , BMI There is no height or weight on file to calculate BMI.  Wt  Readings from Last 3 Encounters:  04/23/21 260 lb 3.2 oz (118 kg)  04/02/21 279 lb 1.6 oz (126.6 kg)  03/14/21 260 lb 1.6 oz (118 kg)     GEN: Well nourished, well developed, in no acute distress. HEENT: normal. Neck: Supple, no JVD, carotid bruits, or masses. Cardiac: ***RRR, no murmurs, rubs, or gallops. No clubbing, cyanosis, edema.  ***Radials/PT 2+ and equal bilaterally.  Respiratory:  ***Respirations regular and unlabored, clear to auscultation bilaterally. GI: Soft, nontender, nondistended. MS: No deformity or atrophy. Skin: Warm and dry, no rash. Neuro:  Strength and sensation are intact. Psych: Normal affect.  Assessment & Plan    *** Elevated troponin -noted during recent admission in the setting of hypercarbic respiratory failure requiring intubation.  CT coronary 04/01/2021 poor study with coronary calcium score of 0.  Troponin elevation likely  demand ischemia. Chronic diastolic heart failure- Hyperlipidemia-most recent LDL 66.  Continue present dose of atorvastatin. Hypertension-  Asthma - Continue to follow with PCP.  OSA - CPAP compliance encouraged.  Schizophrenia/Bipolar disorder - Continue to follow with PCP.  DM2 - Continue to follow with PCP.   Disposition: Follow up {follow up:15908} with Dr. Oval Linsey or APP.  Signed, Loel Dubonnet, NP 05/06/2021, 9:47 PM [facility]

## 2021-05-07 ENCOUNTER — Ambulatory Visit (HOSPITAL_BASED_OUTPATIENT_CLINIC_OR_DEPARTMENT_OTHER): Payer: Medicaid Other | Admitting: Family

## 2021-05-13 ENCOUNTER — Other Ambulatory Visit (HOSPITAL_COMMUNITY): Payer: Self-pay

## 2021-05-14 ENCOUNTER — Other Ambulatory Visit (HOSPITAL_COMMUNITY): Payer: Self-pay

## 2021-05-14 MED ORDER — OXYCODONE HCL 10 MG PO TABS
10.0000 mg | ORAL_TABLET | ORAL | 0 refills | Status: DC | PRN
  Filled 2021-05-14: qty 180, 30d supply, fill #0

## 2021-05-15 ENCOUNTER — Other Ambulatory Visit (HOSPITAL_COMMUNITY): Payer: Self-pay

## 2021-06-01 NOTE — Progress Notes (Signed)
Virtual Visit via Telephone Note  I connected with Lori Bean on 06/01/21 at  9:00 AM EDT by telephone and verified that I am speaking with the correct person using two identifiers.  Location: Patient: Home Provider: Office    I discussed the limitations, risks, security and privacy concerns of performing an evaluation and management service by telephone and the availability of in person appointments. I also discussed with the patient that there may be a patient responsible charge related to this service. The patient expressed understanding and agreed to proceed.   History of Present Illness: 50 year old female, former smoker quit in April 2022 (24-pack-year history).  Past medical history significant for chronic asthma, obstructive sleep apnea, hypertensive heart disease, chronic diastolic heart failure, bipolar disorder, schizophrenia.  Patient of Dr. [name].  Previous [facility] pulmonary encounter:  The office visit with me was 09/2018, weight then was 255 pounds. She is currently at 265.  She is very compliant with her BiPAP machine, used to complain of high pressure but once we changed her auto BiPAP settings this is worked very well for her.  Does not miss a single night. She was hospitalized 4/3 for asthma exacerbation, reviewed discharge summary, seem to have been exacerbated by grill smoke.  She continues to smoke a pack per day but has not smoked again since discharge.  Her diuretics were clarified as torsemide 40 mg twice daily Chest x-ray independently reviewed, does not show any infiltrates  Labs showed normal electrolytes, BNP was 20 VBG 08/2019 showed 7.3 6/33/82   04/23/2021 Patient presents today for hospital follow-up for acute hypercapnic and hypoxic respiratory failure requiring intubation and mechanical ventilation.  Patient presented to the emergency room on 7/15 with altered mental status.  EMS was called on patient who was unresponsive, hypotensive and in respiratory  distress.  Patient was given Narcan and breathing assisted with BVM  In the emergency room she was found to be hypertensive, she was intubated and sedated.  CT chest on 03/29/2021 showed patchy/dependent opacities in the posterior upper and lower lobes, favoring atelectasis.  CTA on 04/02/2021 showed no evidence of pulmonary embolism, mild bilateral posterior basilar subsegmental atelectasis.  She was found to have elevated troponin which was felt to be related to NSTEMI versus demand ischemia.  Echocardiogram showed EF [id number], normal diastolic parameters.  Treated for pneumonia with ceftriaxone, vancomycin.  UDS was positive for cannabis.  MRI brain normal, CT head negative.   She is doing much better.  Maintained on Breztri Aerosphere and Singulair for asthma. She is compliant with these medications. She has not needed to use albuterol hfa/neb since she was discharged. Prior when she was smoking she was using SABA 4-6 times a day. She has not had a cigarettes since she was admitted to the hospital in July 2022. She is wearing her BIPAP at night without issue. Sleeping well at night. She had some mild chest pain last night that did not last long. She saw cardiology while in-patient. Denies f/c/s shortness of breath, cough, mucus production, chest tightness, wheezing.    Airview download 03/23/21-04/21/21 (admitted to hospital in July) 23/30 days used (77%); 21 (70%) > 4 hours Average usages days used 7 hours 52 mins Pressure 15/5, PS 3 Airleaks 29.6L/min AHI 4.3   06/02/2021- Interim hx  Patient contacted today for 1 month follow-up for OSA. BIPAP setting were recently changed to 15/8. Calling to see how she is doing on new settings. She is doing well with current BIPAP settings. No  issues with pressure setting, mask fit or airleak. Sleeping well.   Airview download 05/03/21-06/01/21 Usage 30/30 days (100%); 29 days (97%) > 4 hours Average usage 9 hours 29 mins Pressure max IPAP 15; min Epap 8cm, PS  3 Airleaks 18.7L/min AHI 1.1    Significant tests/ events reviewed NPSG 06/2016  moderate OSA with AHI of 26/hour with lowest desaturation of 87%. This was corrected by CPAP of 14 cm with a small fullface mask.   07/2017 titration >> Bipap 16/12   07/20/2018 PFTs - FVC 1.77 (65%), FEV1 1.33 (60%), RATIO 75, DLCOcor 17.01 (78%)    07/04/18 Spirometry- FVC 1.6 (59%), FEV1 1.1 (50%), ratio 69   10/28/2017 Spirometry - FEV1 1.01 (45%)  Ratio 67    Observations/Objective:  - Able to speak in full sentences; no overt shortness of breath or wheezing   Assessment and Plan:  OSA: - Tolerating CPAP well, no issues with pressure setting or mask fit - Pressure 15/8; Residual AHI 1.1 - No changes at this time  Follow Up Instructions:  - FU 3 months with Dr. [name]    I discussed the assessment and treatment plan with the patient. The patient was provided an opportunity to ask questions and all were answered. The patient agreed with the plan and demonstrated an understanding of the instructions.   The patient was advised to call back or seek an in-person evaluation if the symptoms worsen or if the condition fails to improve as anticipated.  I provided 8 minutes of non-face-to-face time during this encounter.   Martyn Ehrich, NP

## 2021-06-02 ENCOUNTER — Ambulatory Visit (INDEPENDENT_AMBULATORY_CARE_PROVIDER_SITE_OTHER): Payer: Medicaid Other | Admitting: Primary Care

## 2021-06-02 ENCOUNTER — Encounter: Payer: Self-pay | Admitting: Primary Care

## 2021-06-02 ENCOUNTER — Other Ambulatory Visit: Payer: Self-pay

## 2021-06-02 VITALS — Ht 62.0 in | Wt 272.0 lb

## 2021-06-02 DIAGNOSIS — G4730 Sleep apnea, unspecified: Secondary | ICD-10-CM | POA: Diagnosis not present

## 2021-06-02 NOTE — Patient Instructions (Signed)
No changes FU in 3 months

## 2021-06-05 ENCOUNTER — Encounter (HOSPITAL_BASED_OUTPATIENT_CLINIC_OR_DEPARTMENT_OTHER): Payer: Self-pay | Admitting: Family

## 2021-06-05 ENCOUNTER — Other Ambulatory Visit: Payer: Self-pay

## 2021-06-05 ENCOUNTER — Ambulatory Visit (INDEPENDENT_AMBULATORY_CARE_PROVIDER_SITE_OTHER): Payer: Medicaid Other | Admitting: Family

## 2021-06-05 VITALS — BP 150/88 | HR 80 | Ht 62.0 in | Wt 273.0 lb

## 2021-06-05 DIAGNOSIS — I10 Essential (primary) hypertension: Secondary | ICD-10-CM

## 2021-06-05 DIAGNOSIS — I890 Lymphedema, not elsewhere classified: Secondary | ICD-10-CM

## 2021-06-05 DIAGNOSIS — Z720 Tobacco use: Secondary | ICD-10-CM

## 2021-06-05 DIAGNOSIS — I5032 Chronic diastolic (congestive) heart failure: Secondary | ICD-10-CM

## 2021-06-05 DIAGNOSIS — J449 Chronic obstructive pulmonary disease, unspecified: Secondary | ICD-10-CM | POA: Diagnosis not present

## 2021-06-05 DIAGNOSIS — G4733 Obstructive sleep apnea (adult) (pediatric): Secondary | ICD-10-CM

## 2021-06-05 IMAGING — CT CT ABDOMEN AND PELVIS WITH CONTRAST
2 of 5 series · 16 of 46 positions shown, 18 images · IV contrast (omnipaque)
Comparison: CT 06/24/2016

CLINICAL DATA: Acute abdominal pain. Nausea and vomiting

EXAM:
CT ABDOMEN AND PELVIS WITH CONTRAST
TECHNIQUE: Multidetector CT imaging of the abdomen and pelvis was performed
using the standard protocol following bolus administration of
intravenous contrast.
CONTRAST:  100mL OMNIPAQUE IOHEXOL 300 MG/ML  SOLN

[Series 3: a/p w/ 5mm · axial · 0.93mm/px · z∈[+866,+1220]mm · 13 of 81 slices shown, 15 images]
[im 5/81  soft-tissue]
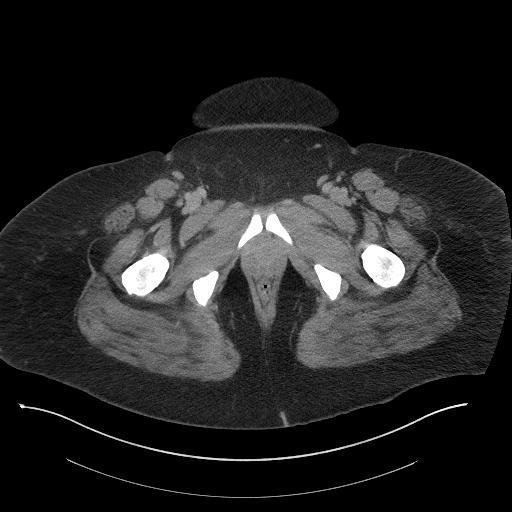
[im 5/81  bone]
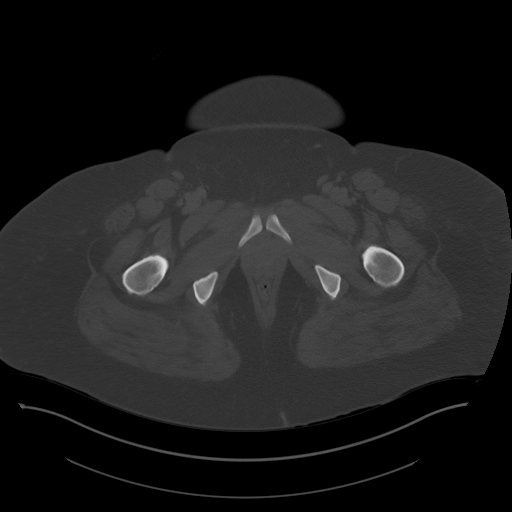
[im 9/81  soft-tissue]
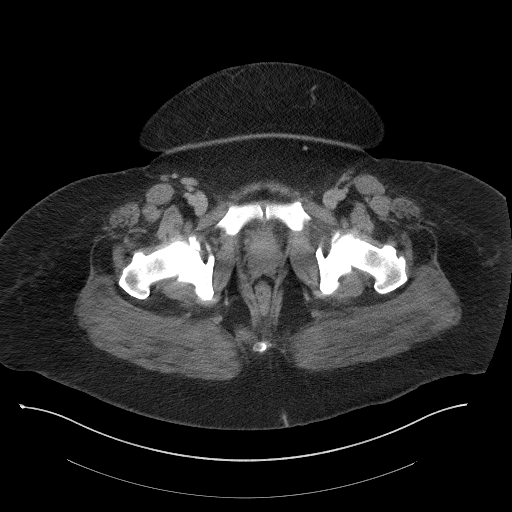
[im 18/81  soft-tissue]
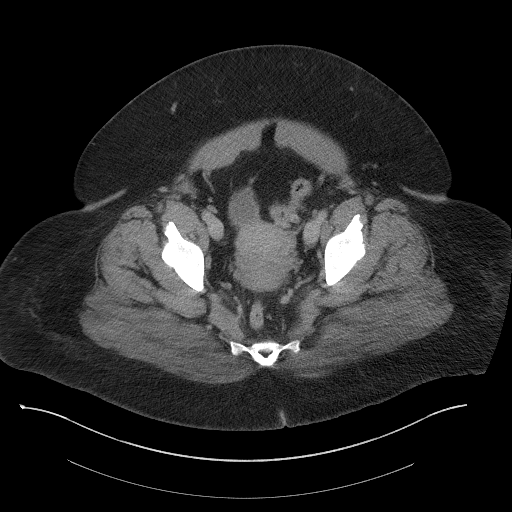
[im 23/81  soft-tissue]
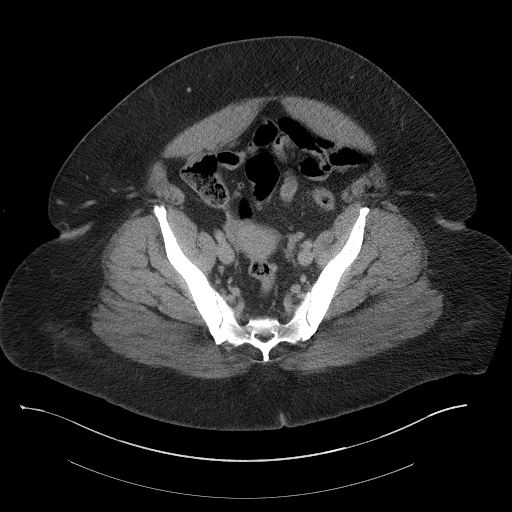
[im 27/81  soft-tissue]
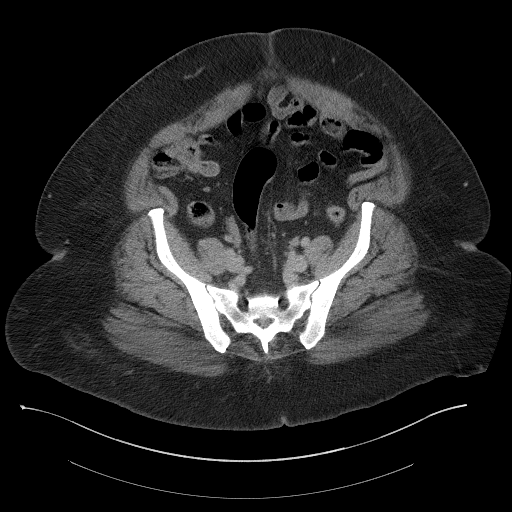
[im 36/81  soft-tissue]
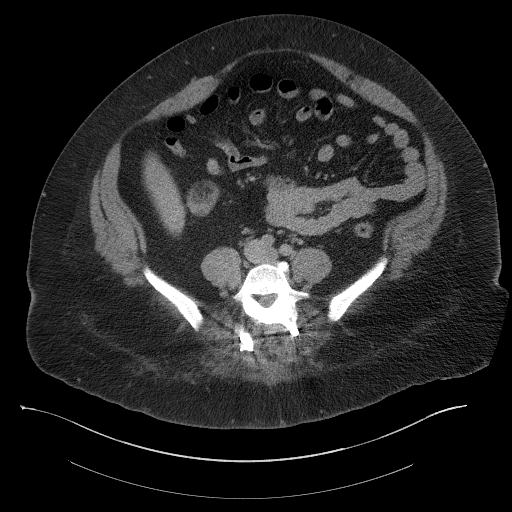
[im 41/81  soft-tissue]
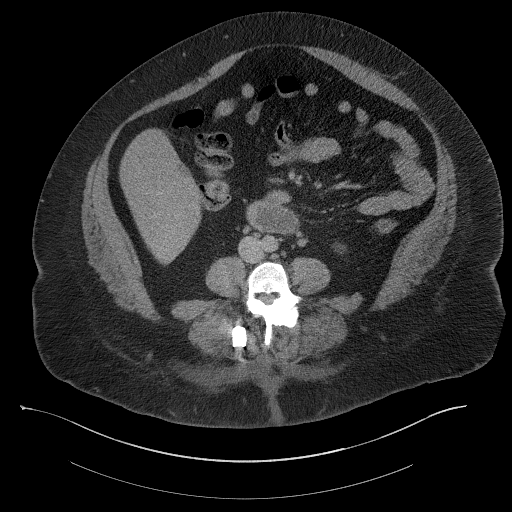
[im 45/81  soft-tissue]
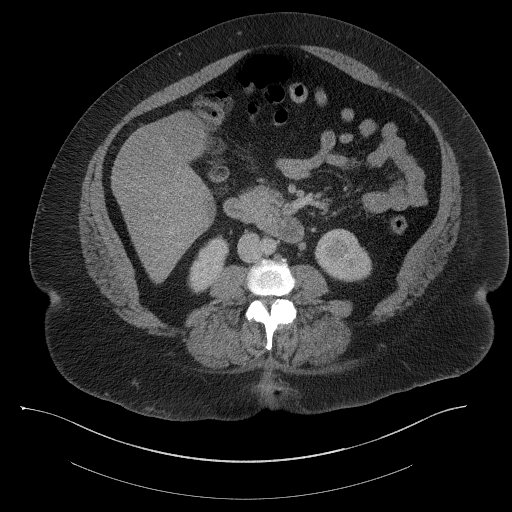
[im 54/81  soft-tissue]
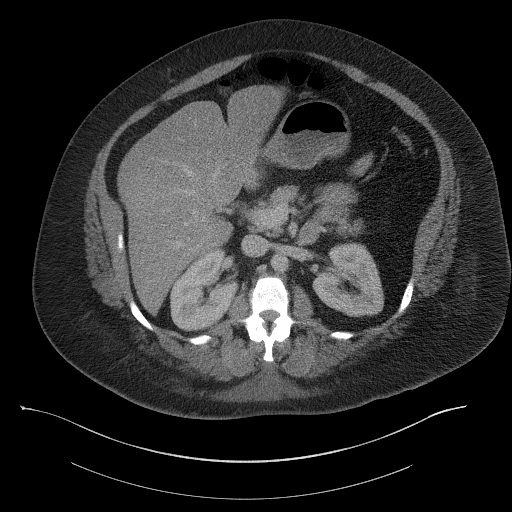
[im 54/81  bone]
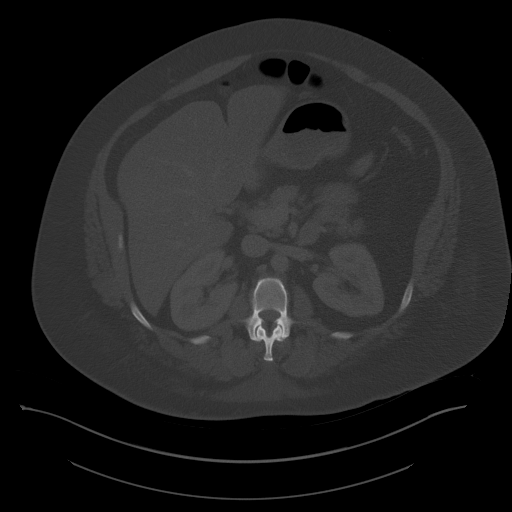
[im 58/81  soft-tissue]
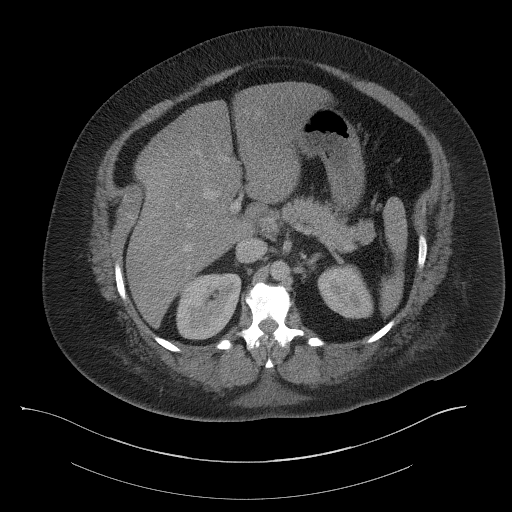
[im 63/81  soft-tissue]
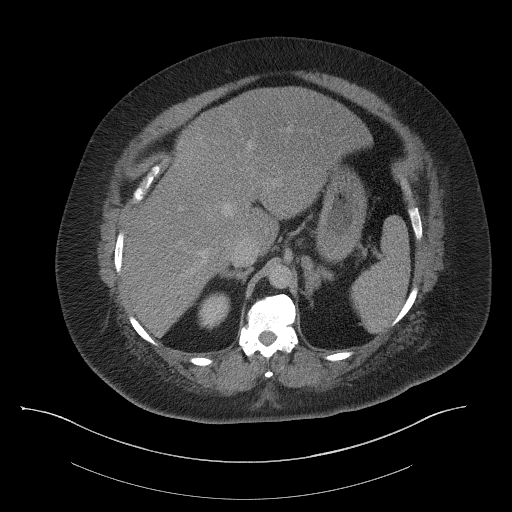
[im 72/81  soft-tissue]
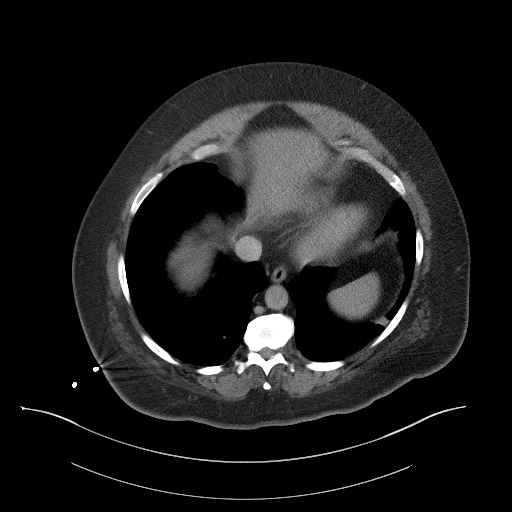
[im 76/81  soft-tissue]
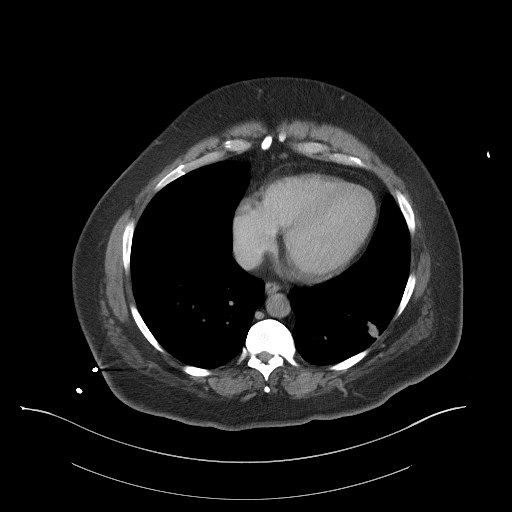

[Series 6: a/p w/ cor · coronal · 0.79mm/px · 3 of 189 slices shown]
[im 63/189  soft-tissue]
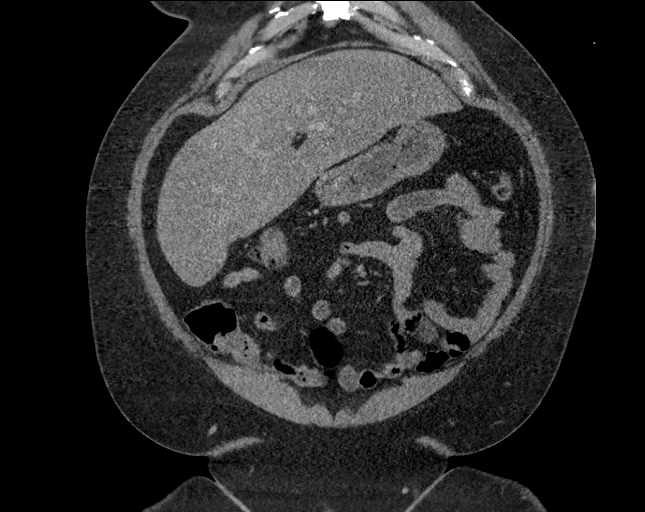
[im 84/189  soft-tissue]
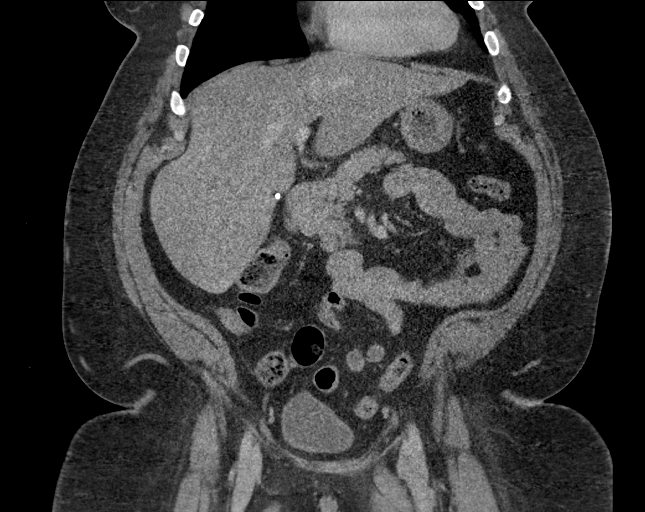
[im 105/189  soft-tissue]
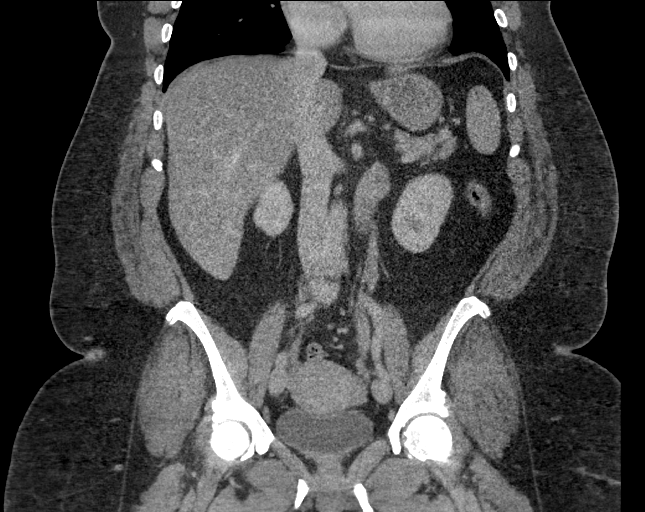

[16 of 46 positions shown; findings below may reference images not displayed]

FINDINGS: Lower chest: Linear left lower lobe atelectasis. No pleural fluid or
confluent airspace disease.

Hepatobiliary: Enlarged liver with steatosis. Liver spans 18.5 cm
cranial caudal. Gallbladder physiologically distended, no calcified
stone. No biliary dilatation.

Pancreas: No ductal dilatation or inflammation.

Spleen: Normal in size without focal abnormality.

Adrenals/Urinary Tract: Bilateral adrenal thickening without
dominant nodule. No hydronephrosis or perinephric edema. Homogeneous
renal enhancement with symmetric excretion on delayed phase imaging.
Urinary bladder is partially distended without wall thickening.

Stomach/Bowel: Stomach is unremarkable. No small bowel obstruction,
wall thickening, or inflammation. Normal appendix courses inferior
medially in the pelvis. Small volume of colonic stool.
Diverticulosis most prominent in the sigmoid colon. Faint periaortic
diverticular stranding in the proximal sigmoid colon, image 63
series 3. No abscess or perforation.

Vascular/Lymphatic: Abdominal aorta is normal in caliber. Portal
vein is patent. No enlarged lymph nodes in the abdomen or pelvis.

Reproductive: Uterus and bilateral adnexa are unremarkable.

Other: No free air, free fluid, or intra-abdominal fluid collection.
Diastasis of anterior abdominal musculature.

Musculoskeletal: Degenerative and postsurgical change in the spine.
Avascular necrosis of left femoral head, chronic. There are no acute
or suspicious osseous abnormalities.
IMPRESSION: 1. Findings suggesting mild acute diverticulitis of the proximal
sigmoid colon. No perforation or abscess.
2. Hepatomegaly and hepatic steatosis.

## 2021-06-05 MED ORDER — VALSARTAN 80 MG PO TABS: 80.0000 mg | ORAL_TABLET | Freq: Every day | ORAL | 1 refills | Status: DC

## 2021-06-05 MED ORDER — TRIAMTERENE-HCTZ 37.5-25 MG PO TABS: 1.0000 | ORAL_TABLET | Freq: Every day | ORAL | 1 refills | Status: DC

## 2021-06-05 NOTE — Progress Notes (Signed)
Office Visit    Patient Name: Lori Bean Date of Encounter: 06/05/2021  PCP:  [name], [place]  Cardiologist:  [name], MD  Advanced Practice Provider:  No care team member to display Electrophysiologist:  None     Chief Complaint    Lori Bean is a 50 y.o. female with a hx of chronic diastolic heart failure, hypertensive heart disease, asthma, OSA on CPAP, lymphedema, schizophrenia, bipolar disorder, obesity, prior PE presents today for hospital follow-up  Past Medical History    Past Medical History:  Diagnosis Date   Abdominal hernia    Anginal pain ([place])    occ from asthma   Anxiety    Arthritis    Asthma    Bipolar affective disorder ([place])    Schizophrenia   Bronchitis    hx of   CHF (congestive heart failure) ([place])    Depression    Diabetes mellitus without complication ([place])    " New Onset " per patient   GERD (gastroesophageal reflux disease)    Heart murmur    Hypertension    takes meds   Lymphedema 06/07/2018   Morbid (severe) obesity due to excess calories ([facility])    Neuropathy    left leg , "from back surgery"   Overactive bladder    Schizophrenia ([place])    Sciatica    Shortness of breath    Sleep apnea    Snoring disorder    Pt stated my boyfriend always wakes me up and tells me to breathe   Past Surgical History:  Procedure Laterality Date   BACK SURGERY     3 back surgeries   CHOLECYSTECTOMY     COLONOSCOPY WITH PROPOFOL N/A 01/23/2016   Procedure: COLONOSCOPY WITH PROPOFOL;  Surgeon: Wonda Horner, MD;  Location: [facility];  Service: Endoscopy;  Laterality: N/A;   EYE SURGERY     Metal plate in right eye. Had fracture in right eye   gallstones reomved     KNEE ARTHROSCOPY WITH MENISCAL REPAIR Right 09/28/2016   Procedure: KNEE ARTHROSCOPY WITH MENISCAL REPAIR;  Surgeon: Dorna Leitz, MD;  Location: [facility];  Service: Orthopedics;  Laterality: Right;  Right partial  meniscectomy and chondroplasty, patellar/femoral joint and medial femoral condyle    LUMBAR LAMINECTOMY/DECOMPRESSION MICRODISCECTOMY  04/01/2012   Procedure: LUMBAR LAMINECTOMY/DECOMPRESSION MICRODISCECTOMY 2 LEVELS;  Surgeon: Faythe Ghee, MD;  Location: [facility];  Service: Neurosurgery;  Laterality: Left;  Lumbar four-five, lumbar five sacral one microdiscectomy    LUMBAR WOUND DEBRIDEMENT  04/29/2012   Procedure: LUMBAR WOUND DEBRIDEMENT;  Surgeon: Faythe Ghee, MD;  Location: [facility];  Service: Neurosurgery;  Laterality: N/A;  lumbar wound debridement   ROTATOR CUFF REPAIR     Right shoulder   TUBAL LIGATION      Allergies  No Active Allergies  History of Present Illness    Lori Bean is a 50 y.o. female with a hx of chronic diastolic heart failure, hypertensive heart disease, asthma, OSA on CPAP, lymphedema, schizophrenia, bipolar disorder, obesity, prior PE last seen while hospitalized.  She was admitted 03/28/21 with acute hypercarbic respiratory failure requiring intubation.  Cardiology was consulted for elevated troponin.  Cardiac CT which was less than ideal given body habitus though coronary calcium score of 0.  Troponin was deemed demand ischemia and no further ischemic evaluation warranted.  Her home antihypertensive regimen was resumed.  She presents today for follow-up. Tells  me she is still "short winded" since hospital discharge but this is overall stable. No chest pain, pressure, tightness. Tells me her LE edema is stable at her baseline which she attributes to her lymphedema. Does try to sit with feet elevated but does not use her compression wraps often as she tells me they are too hot. No formal exercise routine but interested in starting one.   EKGs/Labs/Other Studies Reviewed:   The following studies were reviewed today: CT coronary study 04/01/21:   IMPRESSION: 1. Coronary calcium score of 0. This was 0 percentile for age and sex matched  control.   2.  Normal coronary origin with right dominance.   3. No evidence of calcified plaque. Poor quality study for non calcified plaque with possible high grade soft plaque in the mid LCx. The distal LAD is not visualized.   4. The study was not submitted for FFR analysis due to poor quality and significant stitch artifact.   5. Recommend cardiac cath or nuclear stress testing if clinical suspicion high.     Echo from 03/29/21:    1. Left ventricular ejection fraction, by estimation, is 60 to 65%. The  left ventricle has normal function. The left ventricle has no regional  wall motion abnormalities. Left ventricular diastolic parameters were  normal.   2. Right ventricular systolic function is normal. The right ventricular  size is normal.   3. The mitral valve is normal in structure. No evidence of mitral valve  regurgitation. No evidence of mitral stenosis.   4. The aortic valve is normal in structure. Aortic valve regurgitation is  not visualized. No aortic stenosis is present.   5. The inferior vena cava is normal in size with greater than 50%  respiratory variability, suggesting right atrial pressure of 3 mmHg.   Comparison(s): Prior images unable to be directly viewed, comparison made  by report only.   EKG:  No EKG ordered today.   Recent Labs: 06/21/2020: Pro B Natriuretic peptide (BNP) 16.0 03/28/2021: B Natriuretic Peptide 43.4; Magnesium 2.1; TSH 0.634 04/02/2021: ALT 54; BUN 8; Creatinine, Ser 0.58; Potassium 3.9; Sodium 138 04/03/2021: Hemoglobin 11.8; Platelets 175  Recent Lipid Panel    Component Value Date/Time   CHOL 141 04/02/2021 0548   CHOL 148 06/07/2018 1024   TRIG 135 04/02/2021 0548   HDL 48 04/02/2021 0548   HDL 51 06/07/2018 1024   CHOLHDL 2.9 04/02/2021 0548   VLDL 27 04/02/2021 0548   LDLCALC 66 04/02/2021 0548   LDLCALC 76 06/07/2018 1024    Home Medications   Current Meds  Medication Sig   albuterol (PROVENTIL) (2.5 MG/3ML)  0.083% nebulizer solution Take 2.5 mg by nebulization every 4 (four) hours as needed for wheezing or shortness of breath.   albuterol (VENTOLIN HFA) 108 (90 Base) MCG/ACT inhaler Inhale 2 puffs into the lungs every 4 (four) hours as needed for wheezing or shortness of breath.   ARIPiprazole (ABILIFY) 20 MG tablet Take 20 mg by mouth daily.   aspirin 81 MG chewable tablet Chew 1 tablet (81 mg total) by mouth daily.   atorvastatin (LIPITOR) 80 MG tablet Take 1 tablet (80 mg total) by mouth daily.   Budeson-Glycopyrrol-Formoterol (BREZTRI AEROSPHERE) 160-9-4.8 MCG/ACT AERO Inhale 2 puffs into the lungs in the morning and at bedtime.   Dulaglutide (TRULICITY) 9.83 JA/2.5KN SOPN Inject 0.75 mg into the skin every Sunday.   DULoxetine (CYMBALTA) 30 MG capsule Take 30 mg by mouth 2 (two) times daily.   gabapentin (NEURONTIN)  300 MG capsule Take 300 mg by mouth 3 (three) times daily.    glipiZIDE (GLUCOTROL XL) 10 MG 24 hr tablet Take 10 mg by mouth daily with breakfast.    metFORMIN (GLUCOPHAGE) 1000 MG tablet Take 1,000 mg by mouth 2 (two) times daily with a meal.    montelukast (SINGULAIR) 10 MG tablet Take 1 tablet (10 mg total) by mouth at bedtime.   Oxycodone HCl 10 MG TABS Take 10 mg by mouth every 4 (four) hours as needed.   tiZANidine (ZANAFLEX) 4 MG tablet Take 4 mg by mouth every 8 (eight) hours as needed.   topiramate (TOPAMAX) 50 MG tablet Take 50 mg by mouth daily.   traZODone (DESYREL) 50 MG tablet Take 50 mg by mouth at bedtime.   triamterene-hydrochlorothiazide (MAXZIDE-25) 37.5-25 MG tablet Take 1 tablet by mouth daily.   valsartan (DIOVAN) 80 MG tablet Take 80 mg by mouth daily.    Vitamin D, Ergocalciferol, (DRISDOL) 1.25 MG (50000 UNIT) CAPS capsule Take 50,000 Units by mouth once a week.     Review of Systems      All other systems reviewed and are otherwise negative except as noted above.  Physical Exam    VS:  BP (!) 150/88   Pulse 80   Ht 5\' 2"  (1.575 m)   Wt 273 lb  (123.8 kg)   LMP 10/15/2018 Comment: patient had pregnancy test with PCP (test was negative)  SpO2 98%   BMI 49.93 kg/m  , BMI Body mass index is 49.93 kg/m.  Wt Readings from Last 3 Encounters:  06/05/21 273 lb (123.8 kg)  06/02/21 272 lb (123.4 kg)  04/23/21 260 lb 3.2 oz (118 kg)     GEN: Well nourished, overweight, well developed, in no acute distress. HEENT: normal. Neck: Supple, no JVD, carotid bruits, or masses. Cardiac: RRR, no murmurs, rubs, or gallops. No clubbing, cyanosis, edema.  Radials/PT 2+ and equal bilaterally.  Respiratory:  Respirations regular and unlabored, clear to auscultation bilaterally. GI: Soft, nontender, nondistended. MS: No deformity or atrophy. Skin: Warm and dry, no rash. Neuro:  Strength and sensation are intact. Psych: Normal affect.  Assessment & Plan    Elevated troponin -noted during recent admission in the setting of hypercarbic respiratory failure requiring intubation.  CT coronary 04/01/2021 poor study with coronary calcium score of 0.  Troponin elevation likely demand ischemia. No recurrent chest pain, pressure, tightness.   Chronic diastolic heart failure-echocardiogram 03/29/2021 with LVEF 60 to 65%, no R WMA, normal diastolic parameters, no significant valvular normalities. Grossly euvolemic on exam. Low salt diet and fluid restriction <2L encouraged. Continue triamterene-hctz. No indication for loop diuretic at this time.   Hyperlipidemia-most recent LDL 66.  Continue present dose of atorvastatin.  Hypertension- Elevated in clinic but reports well controlled at home with home readings 120s/80s. She will contact our office for Bp persistently >130/80. She has not yet taken her medications this morning. Continue present antihypertensive regimen, refills provided. Referred to PREP program at [facility] to start exercise program. Heart healthy diet and regular cardiovascular exercise encouraged.    Asthma - Continue to follow with PCP and  pulmonology. No sign of acute exacerbation.    OSA - CPAP compliance encouraged. Congratulated on using regularly.   Obesity - Weight loss via diet and exercise encouraged. Discussed the impact being overweight would have on cardiovascular risk. Referred to PREP exercise program at [facility].   Schizophrenia/Bipolar disorder - Continue to follow with PCP.   DM2 - 03/28/21  A1c 7.2. Ideally <7. Continue to follow with PCP.   Lymphedema - Continue lower extremity elevation. Encouraged low salt diet and utilization of her compression hose.   Disposition: Follow up in 3-4 month(s) with Dr. Oval Linsey or APP.  Signed, Loel Dubonnet, NP 06/05/2021, 8:17 AM [place]

## 2021-06-05 NOTE — Patient Instructions (Addendum)
Medication Instructions:  Continue your current medications.   *If you need a refill on your cardiac medications before your next appointment, please call your pharmacy*   Lab Work: None ordered today.   Testing/Procedures: None ordered today.   Follow-Up: At [facility], you and your health needs are our priority.  As part of our continuing mission to provide you with exceptional heart care, we have created designated Provider Care Teams.  These Care Teams include your primary Cardiologist (physician) and Advanced Practice Providers (APPs -  Physician Assistants and Nurse Practitioners) who all work together to provide you with the care you need, when you need it.  We recommend signing up for the patient portal called "MyChart".  Sign up information is provided on this After Visit Summary.  MyChart is used to connect with patients for Virtual Visits (Telemedicine).  Patients are able to view lab/test results, encounter notes, upcoming appointments, etc.  Non-urgent messages can be sent to your provider as well.   To learn more about what you can do with MyChart, go to [email or web address].    Your next appointment:   3-4 month(s)  The format for your next appointment:   In Person  Provider:   [name], MD   Other Instructions  If your blood pressure is consistently >130/80, please call our office.   Tips to Measure your Blood Pressure Correctly  Here's what you can do to ensure a correct reading:  Don't drink a caffeinated beverage or smoke during the 30 minutes before the test.  Sit quietly for five minutes before the test begins.  During the measurement, sit in a chair with your feet on the floor and your arm supported so your elbow is at about heart level.  The inflatable part of the cuff should completely cover at least 80% of your upper arm, and the cuff should be placed on bare skin, not over a shirt.  Don't talk during the measurement.  Have your blood  pressure measured twice, with a brief break in between. If the readings are different by 5 points or more, have it done a third time.  Blood Pressure Log   Date   Time  Blood Pressure  Position  Example: Nov 1 9 AM 124/78 sitting                                                         Provider Referral Exercise Program (P.R.E.P.)      12 Weeks to Wellness       What is included in the PREP?  --Health Coaching and personalized exercise prescription --Full Membership to participating [facility] for the 12 weeks --Pre- and Post-consultations to assess progress and formulate an exercise plan for       continuation of exercise   What is my investment?  Your cost is $100 (reg. $144). This includes full membership privileges to [facility] in [place] and across the country. If you complete the post-assessment visit, any applicable enrollment costs will be waived if you decide to join the [facility].   Who will benefit from PREP?  People with challenges, including (but not limited to): ---Low back pain ---Arthritis ---Hypertension ---Diabetes ---Obesity ---Joint Replacement ---Neuromuscular Disorders ---Cancer Recovery ---Weight Loss ---Many others (as determined by provider)   Get Started Today! Ask your healthcare provider to  fill out a Healthcare Provider Referral for Exercise form. Be sure to turn it in before you leave the office and send/fax/email it directly to the Wellness RN to schedule your Initial Consultation. If you have family or friends that would also benefit, please share this referral with them.   Contacts:  [facility] [phone]    Winifred Olive, Wellness RN  [phone]  [email or web address]

## 2021-06-10 ENCOUNTER — Telehealth: Payer: Self-pay

## 2021-06-10 NOTE — Telephone Encounter (Signed)
Call to patient reference PREP referral  Explained program to pt Interested, needs an early am class [facility] is the closest to pt however not her preferred schedule Will need help with transport as well as cost of program Next early am will be at [place]  Will have the coach call her to firm up dates and complete initial assessment

## 2021-06-12 ENCOUNTER — Other Ambulatory Visit (HOSPITAL_COMMUNITY): Payer: Self-pay

## 2021-06-12 MED ORDER — OXYCODONE HCL 10 MG PO TABS
10.0000 mg | ORAL_TABLET | ORAL | 0 refills | Status: DC | PRN
  Filled 2021-06-12 – 2021-06-13 (×8): qty 180, 30d supply, fill #0

## 2021-06-13 ENCOUNTER — Other Ambulatory Visit (HOSPITAL_COMMUNITY): Payer: Self-pay

## 2021-06-18 ENCOUNTER — Telehealth: Payer: Self-pay

## 2021-06-18 NOTE — Telephone Encounter (Signed)
Called to discuss October PREP classes at [place]; she prefers going to [facility] as it is closer for her; will ask Pam RN [facility] to contact her about upcoming classes at [place].

## 2021-06-30 ENCOUNTER — Ambulatory Visit (HOSPITAL_BASED_OUTPATIENT_CLINIC_OR_DEPARTMENT_OTHER)
Admit: 2021-06-30 | Discharge: 2021-06-30 | Disposition: A | Discharge status: Home / Self Care | Payer: Medicaid Other | Attending: Physician Assistant | Admitting: Physician Assistant

## 2021-06-30 ENCOUNTER — Encounter (HOSPITAL_COMMUNITY): Payer: Self-pay | Admitting: Emergency Medicine

## 2021-06-30 ENCOUNTER — Ambulatory Visit (HOSPITAL_COMMUNITY)
Admission: EM | Admit: 2021-06-30 | Discharge: 2021-06-30 | Disposition: A | Discharge status: Home / Self Care | Payer: Medicaid Other | Attending: Physician Assistant | Admitting: Physician Assistant

## 2021-06-30 ENCOUNTER — Other Ambulatory Visit: Payer: Self-pay

## 2021-06-30 DIAGNOSIS — M7989 Other specified soft tissue disorders: Secondary | ICD-10-CM

## 2021-06-30 DIAGNOSIS — Z86718 Personal history of other venous thrombosis and embolism: Secondary | ICD-10-CM | POA: Diagnosis not present

## 2021-06-30 DIAGNOSIS — M79605 Pain in left leg: Secondary | ICD-10-CM

## 2021-06-30 LAB — BASIC METABOLIC PANEL
Anion gap: 9 (ref 5–15)
BUN: 16 mg/dL (ref 6–20)
CO2: 25 mmol/L (ref 22–32)
Calcium: 9.7 mg/dL (ref 8.9–10.3)
Chloride: 106 mmol/L (ref 98–111)
Creatinine, Ser: 0.86 mg/dL (ref 0.44–1.00)
GFR, Estimated: >60 mL/min (ref >60)
Glucose, Bld: 126 mg/dL — ABNORMAL HIGH (ref 70–99)
Potassium: 3.9 mmol/L (ref 3.5–5.1)
Sodium: 140 mmol/L (ref 135–145)

## 2021-06-30 NOTE — Progress Notes (Signed)
VASCULAR LAB    Left lower extremity venous duplex has been performed.  See CV proc for preliminary results.   Nakea Gouger, RVT 06/30/2021, 2:31 PM

## 2021-06-30 NOTE — Discharge Instructions (Addendum)
We are going to arrange ultrasound to rule out blood clot.  Please get this at the scheduled time as we discussed.  If we need to start you on medication we will contact you to arrange this.  Keep your leg elevated and continue your prescribed pain medication.  You can use heat for additional symptom relief.  If at any point you develop chest pain, shortness of breath, heart racing you must go to the emergency room.  Follow-up with your primary care provider within a few days to ensure symptom improvement.

## 2021-06-30 NOTE — ED Notes (Signed)
[place] scheduled with cardiovascular [place] for 2pm today. Pt aware.

## 2021-06-30 NOTE — ED Triage Notes (Signed)
Pt presents with left leg pain that starts in lower back and radiates into calf. States has hx of blood clot.

## 2021-06-30 NOTE — ED Provider Notes (Signed)
[facility]    CSN: [id number] Arrival date & time: 06/30/21  1093      History   Chief Complaint Chief Complaint  Patient presents with   Back Pain   Leg Pain    HPI Lori Bean is a 50 y.o. female.   Patient presents today with a several day history of worsening left lower leg pain.  She denies any known injury or increase in activity prior to symptom onset.  Pain is gradually been worsening and is currently rated 10 on a 0-10 pain scale, localized to left calf, described as throbbing/intense aching, no aggravating alleviating factors identified.  She is prescribed Percocet and has been taking this as prescribed without improvement of symptoms.  She has not tried other over-the-counter medications for symptom relief.  She does have a history of blood clots in her left leg and states current symptoms are similar to previous episodes of this condition.  She denies any recent surgery, immobilization, hospitalization, recent travel, exogenous estrogen use, history of malignancy.  She denies any chest pain, shortness of breath, heart racing.  Does report increased leg swelling similar to previous episodes of DVT.  She is not currently on chronic anticoagulation.  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   Past Medical History:  Diagnosis Date   Abdominal hernia    Anginal pain ([place])    occ from asthma   Anxiety    Arthritis    Asthma    Bipolar affective disorder ([place])    Schizophrenia   Bronchitis    hx of   CHF (congestive heart failure) ([place])    Depression    Diabetes mellitus without complication ([place])    " New Onset " per patient   GERD (gastroesophageal reflux disease)    Heart murmur    Hypertension    takes meds   Lymphedema 06/07/2018   Morbid (severe) obesity due to excess calories (H[facility])    Neuropathy    left leg , "from back surgery"   Overactive bladder    Schizophrenia ([place])    Sciatica    Shortness of breath    Sleep apnea     Snoring disorder    Pt stated my boyfriend always wakes me up and tells me to breathe    Patient Active Problem List   Diagnosis Date Noted   CAP (community acquired pneumonia) 04/25/2021   Demand ischemia ([place])    Acute metabolic encephalopathy 23/55/7322   Acute respiratory failure ([place])    Neck pain 10/18/2020   Diabetes mellitus type 2 in obese ([place]) 06/28/2020   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   Sinusitis 07/22/2018   Lymphedema 06/07/2018   Abnormal uterine bleeding (AUB) 12/12/2017   CHF (congestive heart failure) ([place])    Dyspnea on exertion 05/05/2017   Morbid obesity due to excess calories ([place]) 05/05/2017   Chondromalacia, right knee 02/54/2706   Synovial plica of right knee 23/76/2831   Tear of medial meniscus of right knee, initial encounter 09/28/2016   OSA (obstructive sleep apnea) 09/23/2016   Chronic diastolic heart failure ([place]) 04/22/2016   Cigarette smoker 04/03/2014   Chest pain 04/03/2014   Lumbar spondylosis 12/28/2013   Extrinsic asthma 06/07/2013   Anxiety    Essential hypertension    Bipolar affective disorder ([place])    GERD (gastroesophageal reflux disease)    Arthritis    Schizophrenia ([place])    Overactive bladder     Past  Surgical History:  Procedure Laterality Date   BACK SURGERY     3 back surgeries   CHOLECYSTECTOMY     COLONOSCOPY WITH PROPOFOL N/A 01/23/2016   Procedure: COLONOSCOPY WITH PROPOFOL;  Surgeon: Wonda Horner, MD;  Location: [facility];  Service: Endoscopy;  Laterality: N/A;   EYE SURGERY     Metal plate in right eye. Had fracture in right eye   gallstones reomved     KNEE ARTHROSCOPY WITH MENISCAL REPAIR Right 09/28/2016   Procedure: KNEE ARTHROSCOPY WITH MENISCAL REPAIR;  Surgeon: Dorna Leitz, MD;  Location: [facility];  Service: Orthopedics;  Laterality: Right;  Right partial meniscectomy and chondroplasty, patellar/femoral joint and medial femoral condyle    LUMBAR  LAMINECTOMY/DECOMPRESSION MICRODISCECTOMY  04/01/2012   Procedure: LUMBAR LAMINECTOMY/DECOMPRESSION MICRODISCECTOMY 2 LEVELS;  Surgeon: Faythe Ghee, MD;  Location: [facility];  Service: Neurosurgery;  Laterality: Left;  Lumbar four-five, lumbar five sacral one microdiscectomy    LUMBAR WOUND DEBRIDEMENT  04/29/2012   Procedure: LUMBAR WOUND DEBRIDEMENT;  Surgeon: Faythe Ghee, MD;  Location: [facility];  Service: Neurosurgery;  Laterality: N/A;  lumbar wound debridement   ROTATOR CUFF REPAIR     Right shoulder   TUBAL LIGATION      OB History     Gravida  4   Para  2   Term  0   Preterm  2   AB  2   Living  2      SAB  1   IAB  1   Ectopic      Multiple      Live Births  2            Home Medications    Prior to Admission medications   Medication Sig Start Date End Date Taking? Authorizing Provider  albuterol (PROVENTIL) (2.5 MG/3ML) 0.083% nebulizer solution Take 2.5 mg by nebulization every 4 (four) hours as needed for wheezing or shortness of breath.    [provider]  albuterol (VENTOLIN HFA) 108 (90 Base) MCG/ACT inhaler Inhale 2 puffs into the lungs every 4 (four) hours as needed for wheezing or shortness of breath.    [provider]  ARIPiprazole (ABILIFY) 20 MG tablet Take 20 mg by mouth daily. 05/20/21   [provider]  aspirin 81 MG chewable tablet Chew 1 tablet (81 mg total) by mouth daily. 04/03/21   Little Ishikawa, MD  atorvastatin (LIPITOR) 80 MG tablet Take 1 tablet (80 mg total) by mouth daily. 04/03/21   Little Ishikawa, MD  Budeson-Glycopyrrol-Formoterol (BREZTRI AEROSPHERE) 160-9-4.8 MCG/ACT AERO Inhale 2 puffs into the lungs in the morning and at bedtime. 12/18/20   Rigoberto Noel, MD  Dulaglutide (TRULICITY) 9.50 DT/2.6ZT SOPN Inject 0.75 mg into the skin every Sunday.    [provider]  DULoxetine (CYMBALTA) 30 MG capsule Take 30 mg by mouth 2 (two) times daily. 11/18/20   [provider]  gabapentin (NEURONTIN) 300 MG capsule Take 300 mg by mouth 3 (three) times daily.  02/02/19   [provider]  glipiZIDE (GLUCOTROL XL) 10 MG 24 hr tablet Take 10 mg by mouth daily with breakfast.     [provider]  metFORMIN (GLUCOPHAGE) 1000 MG tablet Take 1,000 mg by mouth 2 (two) times daily with a meal.     [provider]  montelukast (SINGULAIR) 10 MG tablet Take 1 tablet (10 mg total) by mouth at bedtime. 09/20/19   Martyn Ehrich, NP  Oxycodone HCl 10 MG TABS Take 10 mg by mouth every 4 (four) hours as needed. 04/11/21   [provider]  Oxycodone HCl 10 MG TABS Take 1 tablet (10 mg total) by mouth every 4 (four) hours as needed. 06/12/21     tiZANidine (ZANAFLEX) 4 MG tablet Take 4 mg by mouth every 8 (eight) hours as needed. 04/09/21   [provider]  topiramate (TOPAMAX) 50 MG tablet Take 50 mg by mouth daily.    [provider]  traZODone (DESYREL) 50 MG tablet Take 50 mg by mouth at bedtime. 11/15/20   [provider]  triamterene-hydrochlorothiazide (MAXZIDE-25) 37.5-25 MG tablet Take 1 tablet by mouth daily. 06/05/21   Loel Dubonnet, NP  valsartan (DIOVAN) 80 MG tablet Take 1 tablet (80 mg total) by mouth daily. 06/05/21   Loel Dubonnet, NP  Vitamin D, Ergocalciferol, (DRISDOL) 1.25 MG (50000 UNIT) CAPS capsule Take 50,000 Units by mouth once a week. 04/22/21   [provider]    Family History Family History  Problem Relation Age of Onset   Diabetes Mother    Hypertension Mother    Diabetes Father    Heart disease Paternal Aunt    Cancer Paternal Aunt    Esophageal cancer Neg Hx    Stomach cancer Neg Hx     Social History Social History   Tobacco Use   Smoking status: Former    Packs/day: 1.00    Years: 24.00    Pack years: 24.00    Types: Cigarettes    Quit date: 12/16/2020    Years since quitting: 0.5   Smokeless tobacco: Never   Tobacco comments:     last cigarette on  12/16/2020  Vaping Use   Vaping Use: Every day   Substances: Nicotine  Substance Use Topics   Alcohol use: No    Alcohol/week: 0.0 standard drinks    Comment: quit Nov. 2014   Drug use: Not Currently    Types: Cocaine     Allergies   Patient has no known allergies.   Review of Systems Review of Systems  Constitutional:  Positive for activity change. Negative for appetite change, fatigue and fever.  Respiratory:  Negative for cough and shortness of breath.   Cardiovascular:  Positive for leg swelling. Negative for chest pain and palpitations.  Gastrointestinal:  Negative for abdominal pain, diarrhea, nausea and vomiting.  Musculoskeletal:  Positive for myalgias. Negative for arthralgias.  Neurological:  Positive for numbness. Negative for dizziness, weakness, light-headedness and headaches.    Physical Exam Triage Vital Signs ED Triage Vitals  Enc Vitals Group     BP 06/30/21 0834 (!) 163/81     Pulse Rate 06/30/21 0834 84     Resp 06/30/21 0834 18     Temp 06/30/21 0834 97.9 F (36.6 C)     Temp Source 06/30/21 0834 Oral     SpO2 06/30/21 0834 99 %     Weight --      Height --      Head Circumference --      Peak Flow --      Pain Score 06/30/21 0832 10     Pain Loc --      Pain Edu? --      Excl. in [place]? --    No data found.  Updated Vital Signs BP (!) 163/81 (BP Location: Left Wrist)   Pulse 84   Temp 97.9 F (36.6 C) (Oral)   Resp 18  LMP 10/15/2018 Comment: patient had pregnancy test with PCP (test was negative)  SpO2 99%   Visual Acuity Right Eye Distance:   Left Eye Distance:   Bilateral Distance:    Right Eye Near:   Left Eye Near:    Bilateral Near:     Physical Exam Vitals reviewed.  Constitutional:      General: She is awake. She is not in acute distress.    Appearance: Normal appearance. She is well-developed. She is not ill-appearing.     Comments: Very pleasant female appears stated age no acute distress sitting comfortably in exam  room  HENT:     Head: Normocephalic and atraumatic.  Cardiovascular:     Rate and Rhythm: Normal rate and regular rhythm.     Heart sounds: Normal heart sounds, S1 normal and S2 normal. No murmur heard.    Comments: 1+ pitting edema to mid anterior tibia left leg.  Positive Homans' sign on left. Pulmonary:     Effort: Pulmonary effort is normal.     Breath sounds: Normal breath sounds. No wheezing, rhonchi or rales.  Musculoskeletal:     Right lower leg: No swelling, tenderness or bony tenderness.     Left lower leg: Swelling and tenderness present. No bony tenderness.     Comments: Significant tenderness palpation throughout posterior left leg.  No palpable cords.  No deformity noted.  Psychiatric:        Behavior: Behavior is cooperative.     UC Treatments / Results  Labs (all labs ordered are listed, but only abnormal results are displayed) Labs Reviewed  BASIC METABOLIC PANEL    EKG   Radiology No results found.  Procedures Procedures (including critical care time)  Medications Ordered in UC Medications - No data to display  Initial Impression / Assessment and Plan / UC Course  I have reviewed the triage vital signs and the nursing notes.  Pertinent labs & imaging results that were available during my care of the patient were reviewed by me and considered in my medical decision making (see chart for details).      Given history of DVT with similar presentation will obtain venous ultrasound to ensure blood clot is not contributing to current symptoms.  Patient has not had recent blood work so we will obtain BMP in case she requires anticoagulation for renal adjustment if necessary.  She is prescribed oxycodone as well as muscle relaxers and encouraged to use these medications as prescribed to manage pain.  She is to keep area elevated and can use heat for additional symptom relief.  Discussed that if she develops any chest pain, shortness of breath, palpitations she  must go directly to the emergency room to which she expressed understanding.  Encouraged her to follow-up with her primary care provider within a week to ensure symptom improvement and for ongoing management.  Strict return precautions given to which she expressed understanding.  Final Clinical Impressions(s) / UC Diagnoses   Final diagnoses:  Left leg pain  Left leg swelling  History of DVT (deep vein thrombosis)     Discharge Instructions      We are going to arrange ultrasound to rule out blood clot.  Please get this at the scheduled time as we discussed.  If we need to start you on medication we will contact you to arrange this.  Keep your leg elevated and continue your prescribed pain medication.  You can use heat for additional symptom relief.  If at any  point you develop chest pain, shortness of breath, heart racing you must go to the emergency room.  Follow-up with your primary care provider within a few days to ensure symptom improvement.     ED Prescriptions   None    PDMP not reviewed this encounter.   Terrilee Croak, PA-C 06/30/21 0932

## 2021-07-10 ENCOUNTER — Encounter (HOSPITAL_COMMUNITY): Payer: Self-pay

## 2021-07-10 ENCOUNTER — Ambulatory Visit (HOSPITAL_COMMUNITY)
Admission: RE | Admit: 2021-07-10 | Discharge: 2021-07-10 | Disposition: A | Discharge status: Home / Self Care | Payer: Medicaid Other | Source: Ambulatory Visit | Attending: Student | Admitting: Student

## 2021-07-10 ENCOUNTER — Other Ambulatory Visit: Payer: Self-pay

## 2021-07-10 VITALS — BP 166/82 | HR 90 | Temp 98.1°F | Resp 18

## 2021-07-10 DIAGNOSIS — Z8701 Personal history of pneumonia (recurrent): Secondary | ICD-10-CM | POA: Diagnosis not present

## 2021-07-10 DIAGNOSIS — J069 Acute upper respiratory infection, unspecified: Secondary | ICD-10-CM

## 2021-07-10 DIAGNOSIS — J4541 Moderate persistent asthma with (acute) exacerbation: Secondary | ICD-10-CM

## 2021-07-10 MED ORDER — ALBUTEROL SULFATE HFA 108 (90 BASE) MCG/ACT IN AERS: 1.0000 | INHALATION_SPRAY | Freq: Four times a day (QID) | RESPIRATORY_TRACT | 0 refills | Status: DC | PRN

## 2021-07-10 MED ORDER — AMOXICILLIN-POT CLAVULANATE 875-125 MG PO TABS: 1.0000 | ORAL_TABLET | Freq: Two times a day (BID) | ORAL | 0 refills | Status: DC

## 2021-07-10 NOTE — ED Provider Notes (Signed)
[place]    CSN: [id number] Arrival date & time: 07/10/21  1148      History   Chief Complaint Chief Complaint  Patient presents with   Cough    HPI Lori Bean is a 50 y.o. female presenting with cough and nasal/chest congestion x2 days. Medical history asthma and allergies, controlled on breztri inhaler and singulair. Pneumonia few months ago. Also with CHF, GERD, schizophrenia, hypertension.  Notes some congestion and shortness of breath, with some chest wall tenderness with deep inspiration.  Using the [facility] inhaler with minimal improvement.  Denies fever/chills, chest pain.  Has not tried medications for the symptoms.  HPI  Past Medical History:  Diagnosis Date   Abdominal hernia    Anginal pain ([place])    occ from asthma   Anxiety    Arthritis    Asthma    Bipolar affective disorder ([place])    Schizophrenia   Bronchitis    hx of   CHF (congestive heart failure) ([place])    Depression    Diabetes mellitus without complication ([place])    " New Onset " per patient   GERD (gastroesophageal reflux disease)    Heart murmur    Hypertension    takes meds   Lymphedema 06/07/2018   Morbid (severe) obesity due to excess calories (HCC)    Neuropathy    left leg , "from back surgery"   Overactive bladder    Schizophrenia ([place])    Sciatica    Shortness of breath    Sleep apnea    Snoring disorder    Pt stated my boyfriend always wakes me up and tells me to breathe    Patient Active Problem List   Diagnosis Date Noted   CAP (community acquired pneumonia) 04/25/2021   Demand ischemia ([place])    Acute metabolic encephalopathy 53/66/4403   Acute respiratory failure ([place])    Neck pain 10/18/2020   Diabetes mellitus type 2 in obese ([place]) 06/28/2020   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   Sinusitis 07/22/2018   Lymphedema 06/07/2018   Abnormal uterine bleeding (AUB) 12/12/2017   CHF (congestive heart failure) ([place])     Dyspnea on exertion 05/05/2017   Morbid obesity due to excess calories ([place]) 05/05/2017   Chondromalacia, right knee 47/42/5956   Synovial plica of right knee 38/75/6433   Tear of medial meniscus of right knee, initial encounter 09/28/2016   OSA (obstructive sleep apnea) 09/23/2016   Chronic diastolic heart failure ([place]) 04/22/2016   Cigarette smoker 04/03/2014   Chest pain 04/03/2014   Lumbar spondylosis 12/28/2013   Extrinsic asthma 06/07/2013   Anxiety    Essential hypertension    Bipolar affective disorder (HCC)    GERD (gastroesophageal reflux disease)    Arthritis    Schizophrenia ([place])    Overactive bladder     Past Surgical History:  Procedure Laterality Date   BACK SURGERY     3 back surgeries   CHOLECYSTECTOMY     COLONOSCOPY WITH PROPOFOL N/A 01/23/2016   Procedure: COLONOSCOPY WITH PROPOFOL;  Surgeon: Wonda Horner, MD;  Location: [facility];  Service: Endoscopy;  Laterality: N/A;   EYE SURGERY     Metal plate in right eye. Had fracture in right eye   gallstones reomved     KNEE ARTHROSCOPY WITH MENISCAL REPAIR Right 09/28/2016   Procedure: KNEE ARTHROSCOPY WITH MENISCAL REPAIR;  Surgeon: Dorna Leitz, MD;  Location: [facility];  Service: Orthopedics;  Laterality: Right;  Right partial meniscectomy and chondroplasty, patellar/femoral joint and medial femoral condyle    LUMBAR LAMINECTOMY/DECOMPRESSION MICRODISCECTOMY  04/01/2012   Procedure: LUMBAR LAMINECTOMY/DECOMPRESSION MICRODISCECTOMY 2 LEVELS;  Surgeon: Faythe Ghee, MD;  Location: [facility];  Service: Neurosurgery;  Laterality: Left;  Lumbar four-five, lumbar five sacral one microdiscectomy    LUMBAR WOUND DEBRIDEMENT  04/29/2012   Procedure: LUMBAR WOUND DEBRIDEMENT;  Surgeon: Faythe Ghee, MD;  Location: [facility];  Service: Neurosurgery;  Laterality: N/A;  lumbar wound debridement   ROTATOR CUFF REPAIR     Right shoulder   TUBAL LIGATION      OB History     Gravida  4   Para  2   Term  0    Preterm  2   AB  2   Living  2      SAB  1   IAB  1   Ectopic      Multiple      Live Births  2            Home Medications    Prior to Admission medications   Medication Sig Start Date End Date Taking? Authorizing Provider  albuterol (VENTOLIN HFA) 108 (90 Base) MCG/ACT inhaler Inhale 1-2 puffs into the lungs every 6 (six) hours as needed for wheezing or shortness of breath. 07/10/21  Yes Hazel Sams, PA-C  amoxicillin-clavulanate (AUGMENTIN) 875-125 MG tablet Take 1 tablet by mouth every 12 (twelve) hours. 07/10/21  Yes Hazel Sams, PA-C  ARIPiprazole (ABILIFY) 20 MG tablet Take 20 mg by mouth daily. 05/20/21   [provider]  aspirin 81 MG chewable tablet Chew 1 tablet (81 mg total) by mouth daily. 04/03/21   Little Ishikawa, MD  atorvastatin (LIPITOR) 80 MG tablet Take 1 tablet (80 mg total) by mouth daily. 04/03/21   Little Ishikawa, MD  Budeson-Glycopyrrol-Formoterol (BREZTRI AEROSPHERE) 160-9-4.8 MCG/ACT AERO Inhale 2 puffs into the lungs in the morning and at bedtime. 12/18/20   Rigoberto Noel, MD  Dulaglutide (TRULICITY) 5.73 UK/0.2RK SOPN Inject 0.75 mg into the skin every Sunday.    [provider]  DULoxetine (CYMBALTA) 30 MG capsule Take 30 mg by mouth 2 (two) times daily. 11/18/20   [provider]  gabapentin (NEURONTIN) 300 MG capsule Take 300 mg by mouth 3 (three) times daily.  02/02/19   [provider]  glipiZIDE (GLUCOTROL XL) 10 MG 24 hr tablet Take 10 mg by mouth daily with breakfast.     [provider]  metFORMIN (GLUCOPHAGE) 1000 MG tablet Take 1,000 mg by mouth 2 (two) times daily with a meal.     [provider]  montelukast (SINGULAIR) 10 MG tablet Take 1 tablet (10 mg total) by mouth at bedtime. 09/20/19   Martyn Ehrich, NP  Oxycodone HCl 10 MG TABS Take 10 mg by mouth every 4 (four) hours as needed. 04/11/21   [provider]  Oxycodone HCl 10 MG TABS Take 1 tablet (10 mg  total) by mouth every 4 (four) hours as needed. 06/12/21     tiZANidine (ZANAFLEX) 4 MG tablet Take 4 mg by mouth every 8 (eight) hours as needed. 04/09/21   [provider]  topiramate (TOPAMAX) 50 MG tablet Take 50 mg by mouth daily.    [provider]  traZODone (DESYREL) 50 MG tablet Take 50 mg by mouth at bedtime. 11/15/20   [provider]  triamterene-hydrochlorothiazide (MAXZIDE-25) 37.5-25 MG tablet  Take 1 tablet by mouth daily. 06/05/21   Loel Dubonnet, NP  valsartan (DIOVAN) 80 MG tablet Take 1 tablet (80 mg total) by mouth daily. 06/05/21   Loel Dubonnet, NP  Vitamin D, Ergocalciferol, (DRISDOL) 1.25 MG (50000 UNIT) CAPS capsule Take 50,000 Units by mouth once a week. 04/22/21   [provider]    Family History Family History  Problem Relation Age of Onset   Diabetes Mother    Hypertension Mother    Diabetes Father    Heart disease Paternal Aunt    Cancer Paternal Aunt    Esophageal cancer Neg Hx    Stomach cancer Neg Hx     Social History Social History   Tobacco Use   Smoking status: Former    Packs/day: 1.00    Years: 24.00    Pack years: 24.00    Types: Cigarettes    Quit date: 12/16/2020    Years since quitting: 0.5   Smokeless tobacco: Never   Tobacco comments:     last cigarette on 12/16/2020  Vaping Use   Vaping Use: Every day   Substances: Nicotine  Substance Use Topics   Alcohol use: No    Alcohol/week: 0.0 standard drinks    Comment: quit Nov. 2014   Drug use: Not Currently    Types: Cocaine     Allergies   Patient has no known allergies.   Review of Systems Review of Systems  Constitutional:  Negative for appetite change, chills and fever.  HENT:  Positive for congestion. Negative for ear pain, rhinorrhea, sinus pressure, sinus pain and sore throat.   Eyes:  Negative for redness and visual disturbance.  Respiratory:  Positive for cough. Negative for chest tightness, shortness of breath and wheezing.    Cardiovascular:  Negative for chest pain and palpitations.  Gastrointestinal:  Negative for abdominal pain, constipation, diarrhea, nausea and vomiting.  Genitourinary:  Negative for dysuria, frequency and urgency.  Musculoskeletal:  Negative for myalgias.  Neurological:  Negative for dizziness, weakness and headaches.  Psychiatric/Behavioral:  Negative for confusion.   All other systems reviewed and are negative.   Physical Exam Triage Vital Signs ED Triage Vitals  Enc Vitals Group     BP 07/10/21 1236 (!) 166/82     Pulse Rate 07/10/21 1236 90     Resp 07/10/21 1236 18     Temp 07/10/21 1236 98.1 F (36.7 C)     Temp src --      SpO2 07/10/21 1236 95 %     Weight --      Height --      Head Circumference --      Peak Flow --      Pain Score 07/10/21 1234 10     Pain Loc --      Pain Edu? --      Excl. in [place]? --    No data found.  Updated Vital Signs BP (!) 166/82   Pulse 90   Temp 98.1 F (36.7 C)   Resp 18   LMP 10/15/2018 Comment: patient had pregnancy test with PCP (test was negative)  SpO2 95%   Visual Acuity Right Eye Distance:   Left Eye Distance:   Bilateral Distance:    Right Eye Near:   Left Eye Near:    Bilateral Near:     Physical Exam Vitals reviewed.  Constitutional:      General: She is not in acute distress.    Appearance: Normal appearance.  She is obese. She is not ill-appearing.  HENT:     Head: Normocephalic and atraumatic.     Right Ear: Tympanic membrane, ear canal and external ear normal. No tenderness. No middle ear effusion. There is no impacted cerumen. Tympanic membrane is not perforated, erythematous, retracted or bulging.     Left Ear: Tympanic membrane, ear canal and external ear normal. No tenderness.  No middle ear effusion. There is no impacted cerumen. Tympanic membrane is not perforated, erythematous, retracted or bulging.     Nose: Nose normal. No congestion.     Mouth/Throat:     Mouth: Mucous membranes are moist.      Pharynx: Uvula midline. No oropharyngeal exudate or posterior oropharyngeal erythema.  Eyes:     Extraocular Movements: Extraocular movements intact.     Pupils: Pupils are equal, round, and reactive to light.  Cardiovascular:     Rate and Rhythm: Normal rate and regular rhythm.     Heart sounds: Normal heart sounds.  Pulmonary:     Effort: Pulmonary effort is normal.     Breath sounds: Normal breath sounds. No decreased breath sounds, wheezing, rhonchi or rales.  Chest:     Chest wall: Tenderness present.  Abdominal:     Palpations: Abdomen is soft.     Tenderness: There is no abdominal tenderness. There is no guarding or rebound.  Neurological:     General: No focal deficit present.     Mental Status: She is alert and oriented to person, place, and time.  Psychiatric:        Mood and Affect: Mood normal.        Behavior: Behavior normal.        Thought Content: Thought content normal.        Judgment: Judgment normal.     UC Treatments / Results  Labs (all labs ordered are listed, but only abnormal results are displayed) Labs Reviewed - No data to display  EKG   Radiology No results found.  Procedures Procedures (including critical care time)  Medications Ordered in UC Medications - No data to display  Initial Impression / Assessment and Plan / UC Course  I have reviewed the triage vital signs and the nursing notes.  Pertinent labs & imaging results that were available during my care of the patient were reviewed by me and considered in my medical decision making (see chart for details).     This patient is a very pleasant 50 y.o. year old female presenting with asthma exacerbation related to viral syndrome. Today this pt is afebrile nontachycardic nontachypneic, oxygenating well on room air, no wheezes rhonchi or rales.   Pneumonia few months ago. Currently vitals are within normal limits and lungs are clear to auscultation, but given significant patient concern  for this and history of both pneumonia and current asthma, I am amenable to sending Augmentin.  Also sent albuterol inhaler.  She is fully vaccinated and boosted for COVID, negative home covid tests, declines testing for this today.   ED return precautions discussed. Patient verbalizes understanding and agreement.   Level 4 for acute exacerbation of chronic condition and prescription drug management.  Final Clinical Impressions(s) / UC Diagnoses   Final diagnoses:  Viral URI with cough  History of bacterial pneumonia  Moderate persistent asthma with acute exacerbation     Discharge Instructions      -Start the antibiotic-Augmentin (amoxicillin-clavulanate), 1 pill every 12 hours for 7 days.  You can take this with food  like with breakfast and dinner. -Albuterol inhaler as needed for cough, wheezing, shortness of breath, 1 to 2 puffs every 6 hours as needed. -Continue other inhaler -Follow-up if symptoms getting worse instead of better (shortness of breath, fevers/chills, etc)     ED Prescriptions     Medication Sig Dispense Auth. Provider   albuterol (VENTOLIN HFA) 108 (90 Base) MCG/ACT inhaler Inhale 1-2 puffs into the lungs every 6 (six) hours as needed for wheezing or shortness of breath. 1 each Hazel Sams, PA-C   amoxicillin-clavulanate (AUGMENTIN) 875-125 MG tablet Take 1 tablet by mouth every 12 (twelve) hours. 14 tablet Hazel Sams, PA-C      PDMP not reviewed this encounter.   Hazel Sams, PA-C 07/10/21 1258

## 2021-07-10 NOTE — Discharge Instructions (Addendum)
-  Start the antibiotic-Augmentin (amoxicillin-clavulanate), 1 pill every 12 hours for 7 days.  You can take this with food like with breakfast and dinner. -Albuterol inhaler as needed for cough, wheezing, shortness of breath, 1 to 2 puffs every 6 hours as needed. -Continue other inhaler -Follow-up if symptoms getting worse instead of better (shortness of breath, fevers/chills, etc)

## 2021-07-10 NOTE — ED Triage Notes (Signed)
Pt is present today with a cough, nasal/chest congestion, and body aches. Pt states sx started Tuesday

## 2021-07-14 ENCOUNTER — Other Ambulatory Visit (HOSPITAL_COMMUNITY): Payer: Self-pay

## 2021-07-14 MED ORDER — OXYCODONE HCL 10 MG PO TABS
10.0000 mg | ORAL_TABLET | ORAL | 0 refills | Status: DC | PRN
  Filled 2021-07-14: qty 180, 30d supply, fill #0

## 2021-08-13 ENCOUNTER — Other Ambulatory Visit (HOSPITAL_COMMUNITY): Payer: Self-pay

## 2021-08-13 MED ORDER — OXYCODONE HCL 10 MG PO TABS
10.0000 mg | ORAL_TABLET | Freq: Three times a day (TID) | ORAL | 0 refills | Status: DC | PRN
  Filled 2021-08-13: qty 90, 30d supply, fill #0

## 2021-08-22 ENCOUNTER — Telehealth: Payer: Self-pay | Admitting: Pulmonary Disease

## 2021-08-22 ENCOUNTER — Other Ambulatory Visit: Payer: Self-pay | Admitting: Pulmonary Disease

## 2021-08-22 MED ORDER — BREZTRI AEROSPHERE 160-9-4.8 MCG/ACT IN AERO: 2.0000 | INHALATION_SPRAY | Freq: Two times a day (BID) | RESPIRATORY_TRACT | 6 refills | Status: DC

## 2021-08-22 MED ORDER — ALBUTEROL SULFATE HFA 108 (90 BASE) MCG/ACT IN AERS: 1.0000 | INHALATION_SPRAY | Freq: Four times a day (QID) | RESPIRATORY_TRACT | 6 refills | Status: DC | PRN

## 2021-08-22 MED ORDER — PREDNISONE 10 MG PO TABS: 20.0000 mg | ORAL_TABLET | Freq: Every day | ORAL | 0 refills | Status: DC

## 2021-08-22 NOTE — Telephone Encounter (Signed)
Spoke with pt who states she has SOB and headache for last couple of days. Pt denies fever/ chills/ cough/ wheezing. Pt states she has not had either Breztri nor albuterol since beginning of November. Dr. Ander Slade please advise as Dr. [name] is unavailable.

## 2021-08-22 NOTE — Telephone Encounter (Signed)
Will send in prescription for prednisone  Refill Breztri and albuterol

## 2021-08-22 NOTE — Telephone Encounter (Signed)
Called and spoke with patient. She verbalized understanding of recommendations. RXs have been sent to her pharmacy. Nothing further needed at time of call.

## 2021-08-22 NOTE — Progress Notes (Signed)
Albuterol and Breztri refilled  20 mg prednisone daily for 7 days

## 2021-09-10 ENCOUNTER — Ambulatory Visit (HOSPITAL_BASED_OUTPATIENT_CLINIC_OR_DEPARTMENT_OTHER): Payer: Medicaid Other | Admitting: Cardiovascular Disease

## 2021-09-12 ENCOUNTER — Other Ambulatory Visit (HOSPITAL_COMMUNITY): Payer: Self-pay

## 2021-09-12 MED ORDER — OXYCODONE HCL 10 MG PO TABS
10.0000 mg | ORAL_TABLET | Freq: Three times a day (TID) | ORAL | 0 refills | Status: DC | PRN
  Filled 2021-09-12: qty 90, 30d supply, fill #0

## 2021-09-18 ENCOUNTER — Telehealth: Payer: Self-pay | Admitting: Pulmonary Disease

## 2021-09-18 DIAGNOSIS — G4733 Obstructive sleep apnea (adult) (pediatric): Secondary | ICD-10-CM

## 2021-09-18 NOTE — Telephone Encounter (Signed)
I called and spoke with the patient and she wants to know if you are ok with going back to the original settings for her CPAP. Please let us know the settings you would like. Thanks

## 2021-09-22 NOTE — Telephone Encounter (Signed)
Spoke with the pt and notified of response per [name]. She verbalized understanding. I have sent order to [facility] and scheduled pt to see [name] on 10/27/21. Nothing further needed.

## 2021-10-01 IMAGING — CT CT ANGIO CHEST
3 of 11 series · 18 of 46 positions shown · IV contrast (APPLIED)
Comparison: Chest CTA dated 08/17/2015. Abdomen and pelvis CT dated
04/08/2015.

CLINICAL DATA: Shortness of breath. Elevated D-dimer. Abdominal
pain.

EXAM:
CT ANGIOGRAPHY CHEST
CT ABDOMEN AND PELVIS WITH CONTRAST
TECHNIQUE: Multidetector CT imaging of the chest was performed using the
standard protocol during bolus administration of intravenous
contrast. Multiplanar CT image reconstructions and MIPs were
obtained to evaluate the vascular anatomy. Multidetector CT imaging
of the abdomen and pelvis was performed using the standard protocol
during bolus administration of intravenous contrast.
CONTRAST:  100mL OMNIPAQUE IOHEXOL 350 MG/ML SOLN

[Series 7: thins · axial · 0.76mm/px · z∈[+892,+991]mm · 4 of 266 slices shown (1 of 2)]
[im 34/266  lung]
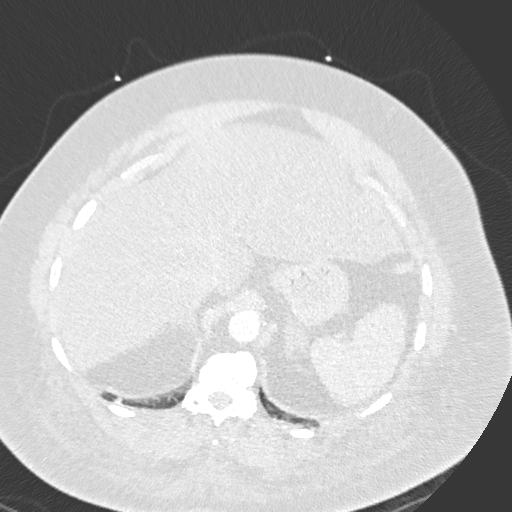
[im 67/266  lung]
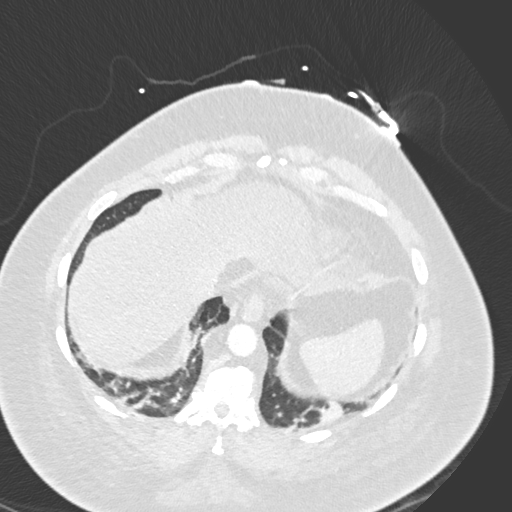
[im 100/266  lung]
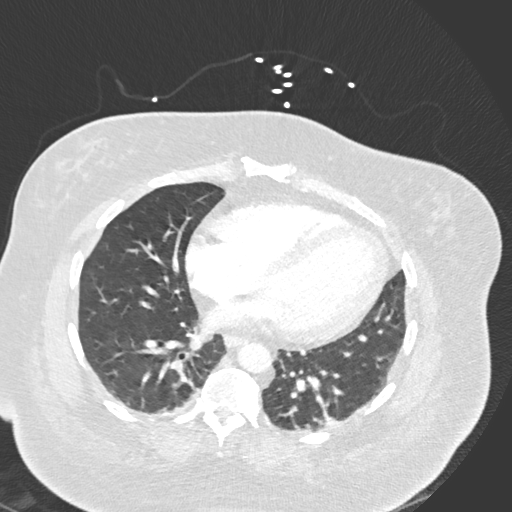
[im 133/266  lung]
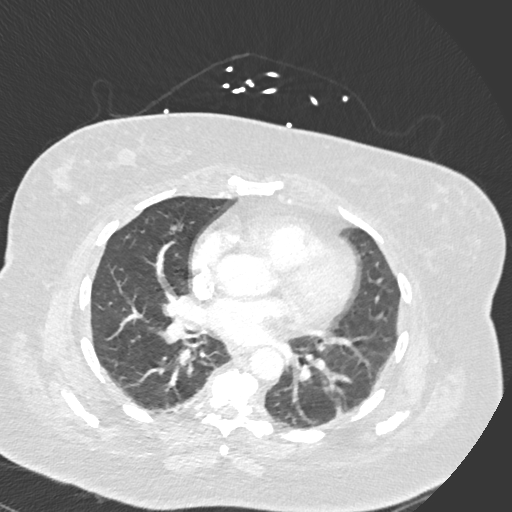

[Series 9: coronal mpr · coronal · 0.50mm/px · 1 of 101 slices shown]
[im 51/101  soft-tissue]
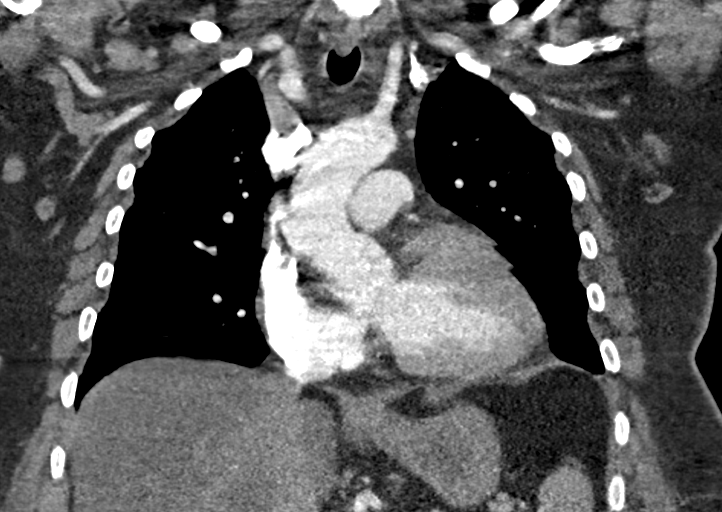

[Series 14: thins · axial · 0.93mm/px · z∈[+580,+949]mm · 13 of 431 slices shown (2 of 2)]
[im 31/431  lung]
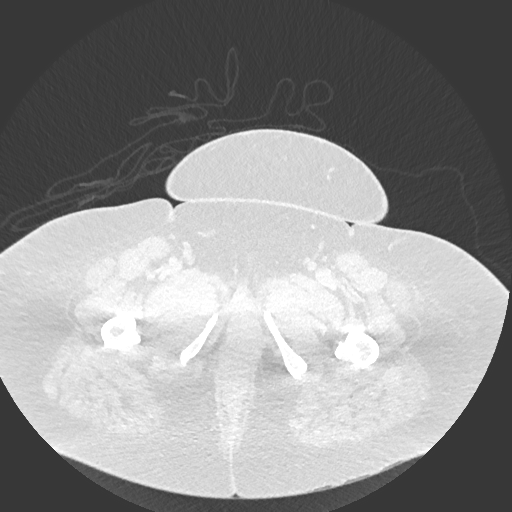
[im 62/431  soft-tissue]
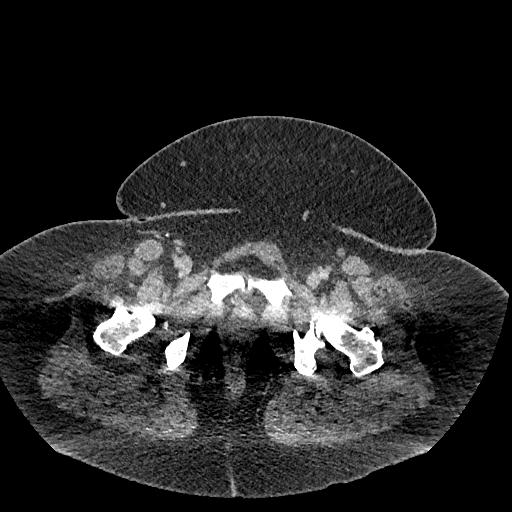
[im 93/431  lung]
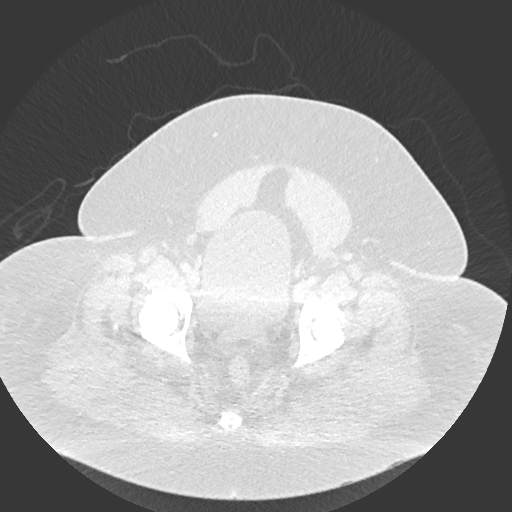
[im 123/431  soft-tissue]
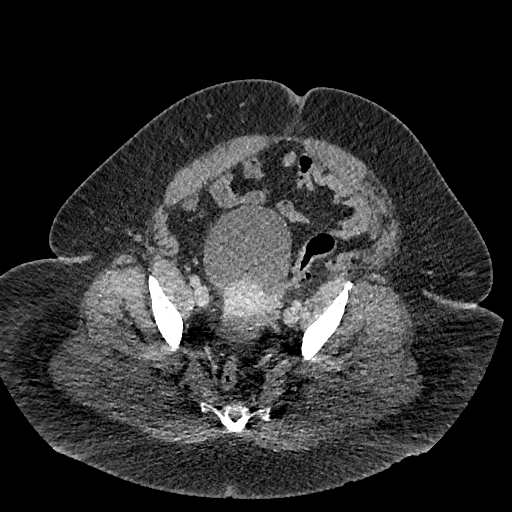
[im 154/431  lung]
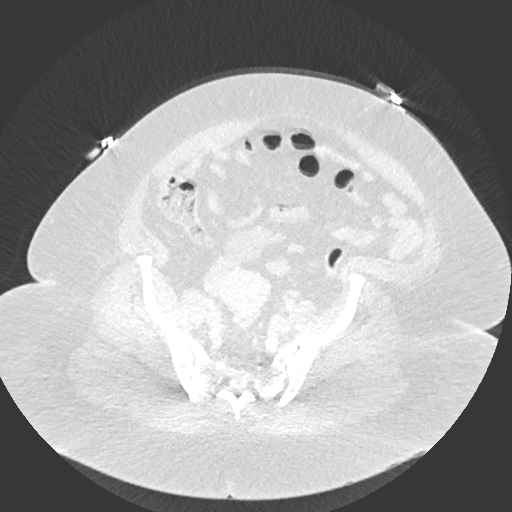
[im 185/431  soft-tissue]
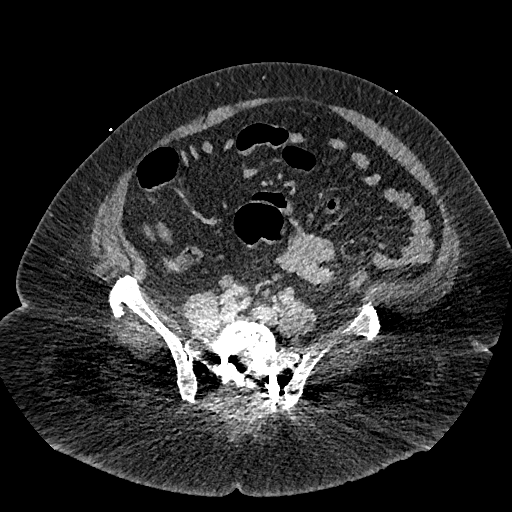
[im 216/431  lung]
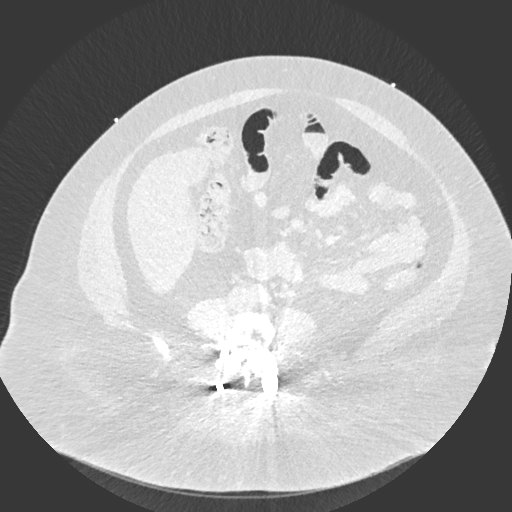
[im 246/431  soft-tissue]
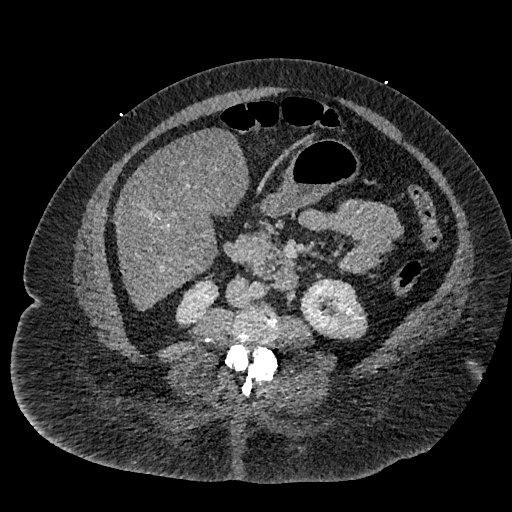
[im 277/431  lung]
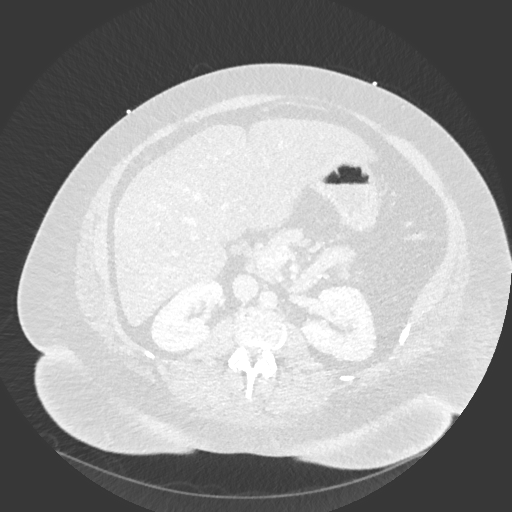
[im 308/431  soft-tissue]
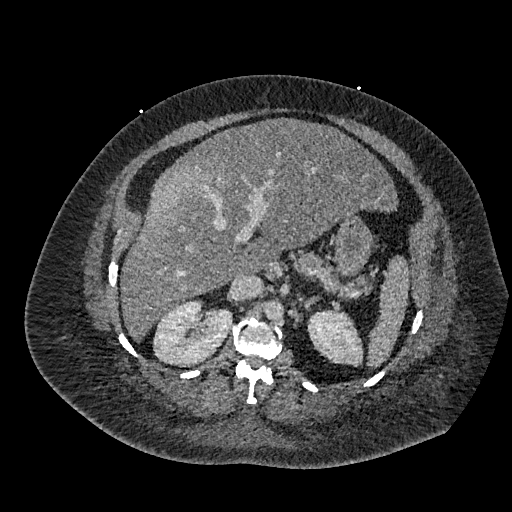
[im 338/431  lung]
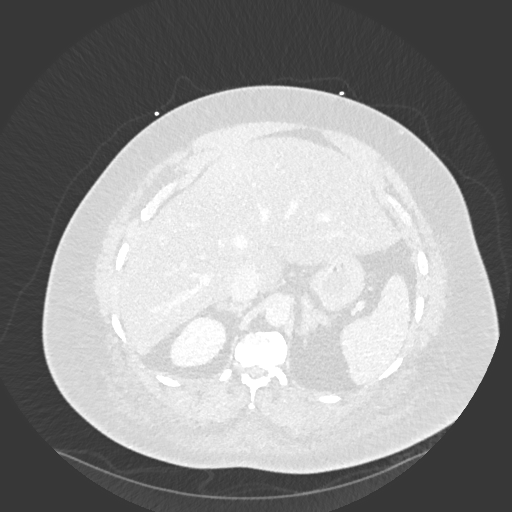
[im 369/431  soft-tissue]
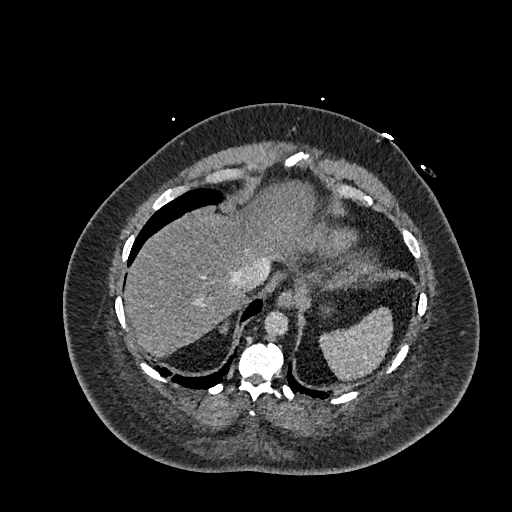
[im 400/431  lung]
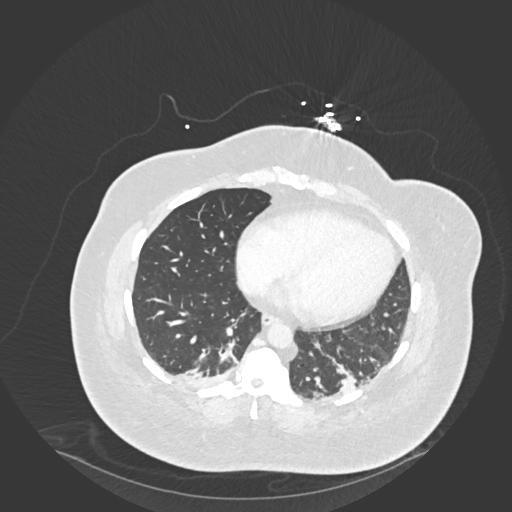

[18 of 46 positions shown; findings below may reference images not displayed]

FINDINGS: CTA CHEST FINDINGS

Cardiovascular: Satisfactory opacification of the pulmonary arteries
to the segmental level. No evidence of pulmonary embolism. Normal
heart size. No pericardial effusion.

Mediastinum/Nodes: No enlarged mediastinal, hilar, or axillary lymph
nodes. Thyroid gland, trachea, and esophagus demonstrate no
significant findings.

Lungs/Pleura: Bilateral lower lobe subsegmental atelectasis. No
pleural fluid.

Musculoskeletal: Thoracic spine degenerative changes, including
changes of DISH.

Review of the MIP images confirms the above findings.

CT ABDOMEN and PELVIS FINDINGS

Hepatobiliary: Marked diffuse low density of the liver relative to
the spleen. Cholecystectomy clips.

Pancreas: Unremarkable. No pancreatic ductal dilatation or
surrounding inflammatory changes.

Spleen: Normal in size without focal abnormality.

Adrenals/Urinary Tract: Stable left adrenal hyperplasia.
Unremarkable right adrenal gland, kidneys, ureters and urinary
bladder.

Stomach/Bowel: Stomach is within normal limits. Appendix appears
normal. No evidence of bowel wall thickening, distention, or
inflammatory changes.

Vascular/Lymphatic: No significant vascular findings are present. No
enlarged abdominal or pelvic lymph nodes.

Reproductive: Uterus and bilateral adnexa are unremarkable.

Other: No abdominal wall hernia or abnormality. No abdominopelvic
ascites.

Musculoskeletal: Interbody and pedicle screw and rod fusion at the
L5-S1 level. Lumbar spine degenerative changes.

Review of the MIP images confirms the above findings.
IMPRESSION: 1. No pulmonary emboli.
2. No acute abnormality in the chest, abdomen or pelvis.
3. Marked diffuse hepatic steatosis.

## 2021-10-01 IMAGING — DX DG CHEST 1V PORT
1 series · 1 of 1 positions shown · non-contrast
Comparison: 12/08/2018

CLINICAL DATA: Shortness of breath and chest pain

EXAM:
PORTABLE CHEST 1 VIEW

[chest ap]
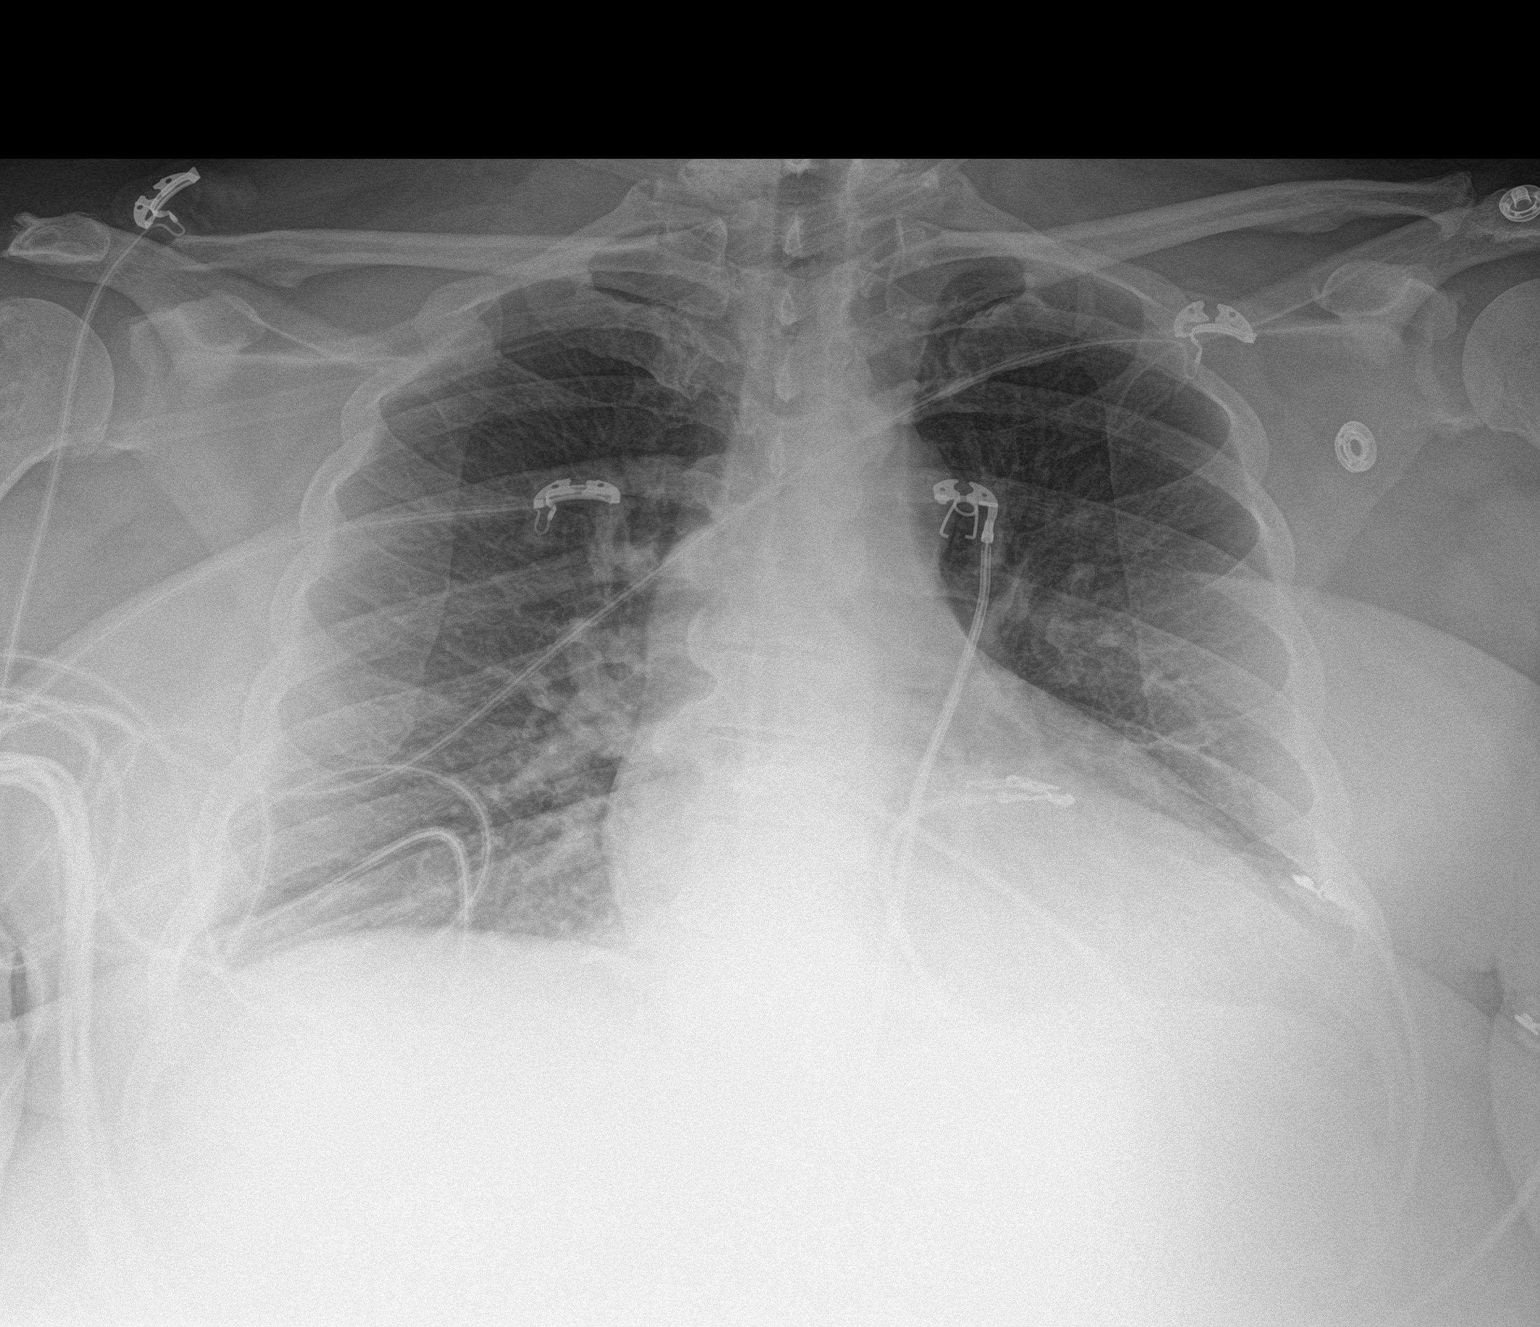

[1 of 1 positions shown; findings below may reference images not displayed]

FINDINGS: The heart is mildly enlarged but stable. The mediastinal and hilar
contours are within normal limits. The lungs are clear. No pleural
effusions or focal infiltrates.
IMPRESSION: Mild stable cardiac enlargement but no acute pulmonary findings.

## 2021-10-13 ENCOUNTER — Other Ambulatory Visit (HOSPITAL_COMMUNITY): Payer: Self-pay

## 2021-10-13 MED ORDER — OXYCODONE HCL 10 MG PO TABS
10.0000 mg | ORAL_TABLET | Freq: Four times a day (QID) | ORAL | 0 refills | Status: DC | PRN
  Filled 2021-10-13: qty 120, 30d supply, fill #0

## 2021-10-27 ENCOUNTER — Other Ambulatory Visit: Payer: Self-pay

## 2021-10-27 ENCOUNTER — Ambulatory Visit (INDEPENDENT_AMBULATORY_CARE_PROVIDER_SITE_OTHER): Payer: Medicaid Other | Admitting: Pulmonary Disease

## 2021-10-27 ENCOUNTER — Encounter: Payer: Self-pay | Admitting: Pulmonary Disease

## 2021-10-27 VITALS — BP 150/84 | HR 60 | Temp 97.9°F | Ht 62.0 in | Wt 263.2 lb

## 2021-10-27 DIAGNOSIS — J449 Chronic obstructive pulmonary disease, unspecified: Secondary | ICD-10-CM | POA: Diagnosis not present

## 2021-10-27 DIAGNOSIS — G4733 Obstructive sleep apnea (adult) (pediatric): Secondary | ICD-10-CM

## 2021-10-27 NOTE — Assessment & Plan Note (Signed)
Continue Breztri. Use albuterol for rescue 

## 2021-10-27 NOTE — Assessment & Plan Note (Signed)
BiPAP download was reviewed which shows excellent compliance 8 to 9 hours per night, good control of events on auto BiPAP settings with average pressure of 13/10 and maximum pressure of 14/11 We will set her up with mask desensitization visit.  She does not need a new machine Weight loss encouraged, compliance with goal of at least 4-6 hrs every night is the expectation. Advised against medications with sedative side effects Cautioned against driving when sleepy - understanding that sleepiness will vary on a day to day basis

## 2021-10-27 NOTE — Progress Notes (Signed)
° °  Subjective:    Patient ID: Lori Bean, female    DOB: 18-Jan-1971, 51 y.o.   MRN: [id number]  HPI  51 yo smoker for FU of chronic asthma & obstructive sleep apnea. PMH -  hypertensive heart disease, chronic diastolic heart failure. She also has bipolar disorder and schizophrenia -chronic pain on oxycodone  She was hospitalized 03/2021 for acute hypercarbic/hypoxic respiratory failure, UDS positive for THC.  Her BiPAP settings were changed and we readjusted to 15/8 She reports compliance with her BiPAP machine, denies problems with mask or pressure, still has a large leak and she wonders if she needs a new machine for this. Weight is mostly stable at 263 pounds.  She has a difficult home situation, she lives with her ex-boyfriend was trying to evict her but she does not seem to have an alternate accommodation.  Chest x-ray 04/2021 reviewed shows no focal airspace disease   Significant tests/ events reviewed  NPSG 06/2016  moderate OSA with AHI of 26/hour with lowest desaturation of 87%. This was corrected by CPAP of 14 cm with a small fullface mask.   07/2017 titration >> Bipap 16/12   07/20/2018 PFTs - FVC 1.77 (65%), FEV1 1.33 (60%), RATIO 75, DLCOcor 17.01 (78%)    07/04/18 Spirometry- FVC 1.6 (59%), FEV1 1.1 (50%), ratio 69   10/28/2017 Spirometry - FEV1 1.01 (45%)  Ratio 67  Review of Systems neg for any significant sore throat, dysphagia, itching, sneezing, nasal congestion or excess/ purulent secretions, fever, chills, sweats, unintended wt loss, pleuritic or exertional cp, hempoptysis, orthopnea pnd or change in chronic leg swelling. Also denies presyncope, palpitations, heartburn, abdominal pain, nausea, vomiting, diarrhea or change in bowel or urinary habits, dysuria,hematuria, rash, arthralgias, visual complaints, headache, numbness weakness or ataxia.     Objective:   Physical Exam  Gen. Pleasant, obese, in no distress ENT - no lesions, no post nasal drip Neck:  No JVD, no thyromegaly, no carotid bruits Lungs: no use of accessory muscles, no dullness to percussion, decreased without rales or rhonchi  Cardiovascular: Rhythm regular, heart sounds  normal, no murmurs or gallops, no peripheral edema Musculoskeletal: No deformities, no cyanosis or clubbing , no tremors       Assessment & Plan:

## 2021-10-27 NOTE — Patient Instructions (Signed)
°  X Call [phone] for sleep lab for mask fitting session  BiPAP is working well  Continue [employer]

## 2021-11-11 ENCOUNTER — Other Ambulatory Visit (HOSPITAL_COMMUNITY): Payer: Self-pay

## 2021-11-12 ENCOUNTER — Other Ambulatory Visit (HOSPITAL_COMMUNITY): Payer: Self-pay

## 2021-11-12 MED ORDER — OXYCODONE HCL 10 MG PO TABS
10.0000 mg | ORAL_TABLET | Freq: Every day | ORAL | 0 refills | Status: DC
  Filled 2021-11-12: qty 150, 30d supply, fill #0

## 2021-11-13 ENCOUNTER — Other Ambulatory Visit (HOSPITAL_COMMUNITY): Payer: Self-pay

## 2021-12-01 ENCOUNTER — Ambulatory Visit (HOSPITAL_BASED_OUTPATIENT_CLINIC_OR_DEPARTMENT_OTHER): Payer: Medicaid Other | Attending: Pulmonary Disease | Admitting: Pulmonary Disease

## 2021-12-01 DIAGNOSIS — G4733 Obstructive sleep apnea (adult) (pediatric): Secondary | ICD-10-CM

## 2021-12-11 ENCOUNTER — Other Ambulatory Visit (HOSPITAL_COMMUNITY): Payer: Self-pay

## 2021-12-12 ENCOUNTER — Other Ambulatory Visit (HOSPITAL_COMMUNITY): Payer: Self-pay

## 2021-12-12 MED ORDER — OXYCODONE HCL 10 MG PO TABS
10.0000 mg | ORAL_TABLET | ORAL | 0 refills | Status: DC | PRN
  Filled 2021-12-12 (×2): qty 180, 30d supply, fill #0

## 2021-12-15 ENCOUNTER — Ambulatory Visit (HOSPITAL_BASED_OUTPATIENT_CLINIC_OR_DEPARTMENT_OTHER): Payer: Medicaid Other | Admitting: Cardiovascular Disease

## 2021-12-18 ENCOUNTER — Ambulatory Visit: Payer: Medicaid Other | Admitting: Adult Health

## 2021-12-26 ENCOUNTER — Other Ambulatory Visit (HOSPITAL_COMMUNITY): Payer: Self-pay

## 2022-01-08 ENCOUNTER — Other Ambulatory Visit (HOSPITAL_COMMUNITY): Payer: Self-pay

## 2022-01-08 MED ORDER — OXYCODONE HCL 10 MG PO TABS
10.0000 mg | ORAL_TABLET | ORAL | 0 refills | Status: DC | PRN
  Filled 2022-01-08 – 2022-01-09 (×2): qty 168, 28d supply, fill #0

## 2022-01-09 ENCOUNTER — Other Ambulatory Visit (HOSPITAL_COMMUNITY): Payer: Self-pay

## 2022-01-12 ENCOUNTER — Telehealth: Payer: Self-pay | Admitting: Pulmonary Disease

## 2022-01-12 DIAGNOSIS — G4733 Obstructive sleep apnea (adult) (pediatric): Secondary | ICD-10-CM

## 2022-01-12 NOTE — Telephone Encounter (Signed)
Order has been placed for pt to receive supplies from DME. Called and spoke with pt letting her know this had been done and she verbalized understanding. Nothing further needed. ?

## 2022-02-06 ENCOUNTER — Other Ambulatory Visit (HOSPITAL_COMMUNITY): Payer: Self-pay

## 2022-02-10 ENCOUNTER — Other Ambulatory Visit (HOSPITAL_COMMUNITY): Payer: Self-pay

## 2022-02-10 MED ORDER — OXYCODONE HCL 10 MG PO TABS
10.0000 mg | ORAL_TABLET | ORAL | 0 refills | Status: DC | PRN
  Filled 2022-02-10: qty 180, 30d supply, fill #0

## 2022-02-24 ENCOUNTER — Other Ambulatory Visit (HOSPITAL_COMMUNITY): Payer: Self-pay

## 2022-02-24 ENCOUNTER — Ambulatory Visit: Payer: Medicaid Other | Admitting: Primary Care

## 2022-02-24 NOTE — Progress Notes (Deleted)
$'@Patient'y$  ID: Lori Bean, female    DOB: [birth year], 51 y.o.   MRN: [id number]  No chief complaint on file.   Referring provider: [name], [place]  HPI:  51 year old female, former smoker quit in April 2022 (24-pack-year history).  Past medical history significant for chronic asthma, obstructive sleep apnea, hypertensive heart disease, chronic diastolic heart failure, bipolar disorder, schizophrenia.  Patient of Dr. [name].  Previous [facility] pulmonary encounter:  The office visit with me was 09/2018, weight then was 255 pounds. She is currently at 265.  She is very compliant with her BiPAP machine, used to complain of high pressure but once we changed her auto BiPAP settings this is worked very well for her.  Does not miss a single night. She was hospitalized 4/3 for asthma exacerbation, reviewed discharge summary, seem to have been exacerbated by grill smoke.  She continues to smoke a pack per day but has not smoked again since discharge.  Her diuretics were clarified as torsemide 40 mg twice daily Chest x-ray independently reviewed, does not show any infiltrates  Labs showed normal electrolytes, BNP was 20 VBG 08/2019 showed 7.3 6/33/82   04/23/2021 Patient presents today for hospital follow-up for acute hypercapnic and hypoxic respiratory failure requiring intubation and mechanical ventilation.  Patient presented to the emergency room on 7/15 with altered mental status.  EMS was called on patient who was unresponsive, hypotensive and in respiratory distress.  Patient was given Narcan and breathing assisted with BVM  In the emergency room she was found to be hypertensive, she was intubated and sedated.  CT chest on 03/29/2021 showed patchy/dependent opacities in the posterior upper and lower lobes, favoring atelectasis.  CTA on 04/02/2021 showed no evidence of pulmonary embolism, mild bilateral posterior basilar subsegmental atelectasis.  She was found to have elevated troponin which  was felt to be related to NSTEMI versus demand ischemia.  Echocardiogram showed EF 98%, normal diastolic parameters.  Treated for pneumonia with ceftriaxone, vancomycin.  UDS was positive for cannabis.  MRI brain normal, CT head negative.   She is doing much better.  Maintained on Breztri Aerosphere and Singulair for asthma. She is compliant with these medications. She has not needed to use albuterol hfa/neb since she was discharged. Prior when she was smoking she was using SABA 4-6 times a day. She has not had a cigarettes since she was admitted to the hospital in July 2022. She is wearing her BIPAP at night without issue. Sleeping well at night. She had some mild chest pain last night that did not last long. She saw cardiology while in-patient. Denies f/c/s shortness of breath, cough, mucus production, chest tightness, wheezing.    Airview download 03/23/21-04/21/21 (admitted to hospital in July) 23/30 days used (77%); 21 (70%) > 4 hours Average usages days used 7 hours 52 mins Pressure 15/5, PS 3 Airleaks 29.6L/min AHI 4.3   06/02/2021- Interim hx  Patient contacted today for 1 month follow-up for OSA. BIPAP setting were recently changed to 15/8. Calling to see how she is doing on new settings. She is doing well with current BIPAP settings. No issues with pressure setting, mask fit or airleak. Sleeping well.   Airview download 05/03/21-06/01/21 Usage 30/30 days (100%); 29 days (97%) > 4 hours Average usage 9 hours 29 mins Pressure max IPAP 15; min Epap 8cm, PS 3 Airleaks 18.7L/min AHI 1.1    02/24/2022- Interim hx  Patient presents today for 4 month fu OSA. She was last seen in February  by Dr. [name].    Recommended mask fitting at sleep lab  On BIPAP and Breztri      Significant tests/ events reviewed NPSG 06/2016  moderate OSA with AHI of 26/hour with lowest desaturation of 87%. This was corrected by CPAP of 14 cm with a small fullface mask.   07/2017 titration >> Bipap 16/12    07/20/2018 PFTs - FVC 1.77 (65%), FEV1 1.33 (60%), RATIO 75, DLCOcor 17.01 (78%)    07/04/18 Spirometry- FVC 1.6 (59%), FEV1 1.1 (50%), ratio 69   10/28/2017 Spirometry - FEV1 1.01 (45%)  Ratio 67  No Known Allergies  Immunization History  Administered Date(s) Administered   Influenza Inj Mdck Quad Pf 08/22/2018   Influenza Split 06/07/2013, 06/15/2015   Influenza,inj,Quad PF,6+ Mos 07/22/2017, 08/31/2019, 06/11/2020   Influenza,inj,quad, With Preservative 08/15/2019   Moderna Sars-Covid-2 Vaccination 12/03/2019, 01/01/2020, 10/24/2020   Pneumococcal Polysaccharide-23 04/03/2014, 08/31/2019   Rabies, IM 05/04/2016, 05/12/2016    Past Medical History:  Diagnosis Date   Abdominal hernia    Anginal pain (HCC)    occ from asthma   Anxiety    Arthritis    Asthma    Bipolar affective disorder ([place])    Schizophrenia   Bronchitis    hx of   CHF (congestive heart failure) ([place])    Depression    Diabetes mellitus without complication ([place])    " New Onset " per patient   GERD (gastroesophageal reflux disease)    Heart murmur    Hypertension    takes meds   Lymphedema 06/07/2018   Morbid (severe) obesity due to excess calories (HCC)    Neuropathy    left leg , "from back surgery"   Overactive bladder    Schizophrenia ([place])    Sciatica    Shortness of breath    Sleep apnea    Snoring disorder    Pt stated my boyfriend always wakes me up and tells me to breathe    Tobacco History: Social History   Tobacco Use  Smoking Status Former   Packs/day: 1.00   Years: 24.00   Total pack years: 24.00   Types: Cigarettes   Quit date: 12/16/2020   Years since quitting: 1.1  Smokeless Tobacco Never  Tobacco Comments    last cigarette on 12/16/2020   Counseling given: Not Answered Tobacco comments:  last cigarette on 12/16/2020   Outpatient Medications Prior to Visit  Medication Sig Dispense Refill   albuterol (VENTOLIN HFA) 108 (90 Base) MCG/ACT inhaler Inhale 1-2 puffs into the  lungs every 6 (six) hours as needed for wheezing or shortness of breath. 1 each 6   ARIPiprazole (ABILIFY) 20 MG tablet Take 20 mg by mouth daily.     aspirin 81 MG chewable tablet Chew 1 tablet (81 mg total) by mouth daily. 30 tablet 0   atorvastatin (LIPITOR) 80 MG tablet Take 1 tablet (80 mg total) by mouth daily. 30 tablet 0   Budeson-Glycopyrrol-Formoterol (BREZTRI AEROSPHERE) 160-9-4.8 MCG/ACT AERO Inhale 2 puffs into the lungs in the morning and at bedtime. 4.8 g 6   Dulaglutide (TRULICITY) 0.01 VC/9.4WH SOPN Inject 0.75 mg into the skin every Sunday.     DULoxetine (CYMBALTA) 30 MG capsule Take 30 mg by mouth 2 (two) times daily.     gabapentin (NEURONTIN) 300 MG capsule Take 300 mg by mouth 3 (three) times daily.      glipiZIDE (GLUCOTROL XL) 10 MG 24 hr tablet Take 10 mg by mouth daily with breakfast.  metFORMIN (GLUCOPHAGE) 1000 MG tablet Take 1,000 mg by mouth 2 (two) times daily with a meal.      montelukast (SINGULAIR) 10 MG tablet Take 1 tablet (10 mg total) by mouth at bedtime. 30 tablet 11   Oxycodone HCl 10 MG TABS Take 10 mg by mouth every 4 (four) hours as needed.     Oxycodone HCl 10 MG TABS Take 1 tablet (10 mg total) by mouth 4 (four) times daily as needed. (Patient not taking: Reported on 10/27/2021) 120 tablet 0   Oxycodone HCl 10 MG TABS Take 1 tablet (10 mg total) by mouth 5 (five) times daily as needed 150 tablet 0   Oxycodone HCl 10 MG TABS Take 1 tablet (10 mg total) by mouth every 4 (four) hours as needed. 180 tablet 0   tiZANidine (ZANAFLEX) 4 MG tablet Take 4 mg by mouth every 8 (eight) hours as needed.     topiramate (TOPAMAX) 50 MG tablet Take 50 mg by mouth daily.     traZODone (DESYREL) 50 MG tablet Take 50 mg by mouth at bedtime.     triamterene-hydrochlorothiazide (MAXZIDE-25) 37.5-25 MG tablet Take 1 tablet by mouth daily. 90 tablet 1   valsartan (DIOVAN) 80 MG tablet Take 1 tablet (80 mg total) by mouth daily. 90 tablet 1   Vitamin D, Ergocalciferol,  (DRISDOL) 1.25 MG (50000 UNIT) CAPS capsule Take 50,000 Units by mouth once a week.     No facility-administered medications prior to visit.      Review of Systems  Review of Systems   Physical Exam  LMP 10/15/2018 Comment: patient had pregnancy test with PCP (test was negative) Physical Exam   Lab Results:  CBC    Component Value Date/Time   WBC 8.8 04/03/2021 0251   RBC 3.63 (L) 04/03/2021 0251   HGB 11.8 (L) 04/03/2021 0251   HGB 14.3 08/16/2019 0929   HCT 34.5 (L) 04/03/2021 0251   HCT 43.1 08/16/2019 0929   PLT 175 04/03/2021 0251   PLT 194 08/16/2019 0929   MCV 95.0 04/03/2021 0251   MCV 95 08/16/2019 0929   MCH 32.5 04/03/2021 0251   MCHC 34.2 04/03/2021 0251   RDW 13.8 04/03/2021 0251   RDW 12.7 08/16/2019 0929   LYMPHSABS 12.7 (H) 03/28/2021 0653   LYMPHSABS 2.7 08/16/2019 0929   MONOABS 1.6 (H) 03/28/2021 0653   EOSABS 0.2 03/28/2021 0653   EOSABS 0.1 08/16/2019 0929   BASOSABS 0.1 03/28/2021 0653   BASOSABS 0.0 08/16/2019 0929    BMET    Component Value Date/Time   NA 140 06/30/2021 0925   NA 141 07/23/2020 0945   K 3.9 06/30/2021 0925   CL 106 06/30/2021 0925   CO2 25 06/30/2021 0925   GLUCOSE 126 (H) 06/30/2021 0925   BUN 16 06/30/2021 0925   BUN 14 07/23/2020 0945   CREATININE 0.86 06/30/2021 0925   CREATININE 0.62 10/09/2016 0857   CALCIUM 9.7 06/30/2021 0925   GFRNONAA >60 06/30/2021 0925   GFRNONAA 69 06/19/2015 1150   GFRAA 113 07/23/2020 0945   GFRAA 80 06/19/2015 1150    BNP    Component Value Date/Time   BNP 43.4 03/28/2021 0653   BNP 15.2 09/18/2016 1058    ProBNP    Component Value Date/Time   PROBNP 16.0 06/21/2020 1004    Imaging: No results found.   Assessment & Plan:   No problem-specific Assessment & Plan notes found for this encounter.     Martyn Ehrich, NP  02/24/2022  

## 2022-03-06 ENCOUNTER — Other Ambulatory Visit (HOSPITAL_COMMUNITY): Payer: Self-pay

## 2022-03-06 MED ORDER — OXYCODONE HCL 10 MG PO TABS
10.0000 mg | ORAL_TABLET | ORAL | 0 refills | Status: DC | PRN
  Filled 2022-03-06 – 2022-03-10 (×4): qty 180, 30d supply, fill #0

## 2022-03-08 ENCOUNTER — Other Ambulatory Visit: Payer: Self-pay

## 2022-03-08 DIAGNOSIS — I8393 Asymptomatic varicose veins of bilateral lower extremities: Secondary | ICD-10-CM

## 2022-03-09 ENCOUNTER — Other Ambulatory Visit (HOSPITAL_COMMUNITY): Payer: Self-pay

## 2022-03-10 ENCOUNTER — Other Ambulatory Visit (HOSPITAL_COMMUNITY): Payer: Self-pay

## 2022-04-02 ENCOUNTER — Ambulatory Visit (HOSPITAL_COMMUNITY)
Admission: RE | Admit: 2022-04-02 | Discharge: 2022-04-02 | Disposition: A | Discharge status: Home / Self Care | Payer: Medicaid Other | Source: Ambulatory Visit | Attending: Vascular Surgery | Admitting: Vascular Surgery

## 2022-04-02 DIAGNOSIS — I8393 Asymptomatic varicose veins of bilateral lower extremities: Secondary | ICD-10-CM | POA: Diagnosis present

## 2022-04-06 NOTE — Progress Notes (Unsigned)
[facility]           OF [place]  History and Physical   Lori Bean is a 51 y.o. female who presents with bilateral leg swelling right worse than left.  She was seen by Dr. Donzetta Matters in 2018 for BLE swelling.  She did have hx of LLE DVT that she was treated with blood thinners.  She did not have any tissue loss or ulceration associated with the swelling.  She was on lasix but it did not help with the swelling.  She was not wearing compression.  Her walking was limited by SOB.  Her duplex revealed minimal reflux and it was felt that her swelling was multifactorial including from her CHF and her weight.  He discussed walking as well as wearing compression socks.  They discussed referral to [facility] for the bariatric program.  She was to follow up as needed.  She comes in today with c/o bilateral leg swelling with right worse than left.  She states that her aunts on her father's side had hx of leg swelling.  She has had hx of DVT in both legs about 10  years ago after back surgery.  She was on blood thinners at that time.  She is not currently.  She also has hx of heart failure.  She does not tolerate compression socks.  She states her swelling is worse at the end of the day.    The pt is on a statin for cholesterol management.  The pt is on a daily aspirin.   Other AC:  none The pt is on diuretic, ARB for hypertension.   The pt is diabetic.   Tobacco hx:  former  No family hx of AAA.  Past Medical History:  Diagnosis Date   Abdominal hernia    Anginal pain ([place])    occ from asthma   Anxiety    Arthritis    Asthma    Bipolar affective disorder ([place])    Schizophrenia   Bronchitis    hx of   CHF (congestive heart failure) ([place])    Depression    Diabetes mellitus without complication ([place])    " New Onset " per patient   GERD (gastroesophageal reflux disease)    Heart murmur    Hypertension    takes meds   Lymphedema 06/07/2018   Morbid  (severe) obesity due to excess calories (HCC)    Neuropathy    left leg , "from back surgery"   Overactive bladder    Schizophrenia ([place])    Sciatica    Shortness of breath    Sleep apnea    Snoring disorder    Pt stated my boyfriend always wakes me up and tells me to breathe    Past Surgical History:  Procedure Laterality Date   BACK SURGERY     3 back surgeries   CHOLECYSTECTOMY     COLONOSCOPY WITH PROPOFOL N/A 01/23/2016   Procedure: COLONOSCOPY WITH PROPOFOL;  Surgeon: Wonda Horner, MD;  Location: [facility];  Service: Endoscopy;  Laterality: N/A;   EYE SURGERY     Metal plate in right eye. Had fracture in right eye   gallstones reomved     KNEE ARTHROSCOPY WITH MENISCAL REPAIR Right 09/28/2016   Procedure: KNEE ARTHROSCOPY WITH MENISCAL REPAIR;  Surgeon: Dorna Leitz, MD;  Location: [facility];  Service: Orthopedics;  Laterality: Right;  Right partial meniscectomy and chondroplasty, patellar/femoral joint  and medial femoral condyle    LUMBAR LAMINECTOMY/DECOMPRESSION MICRODISCECTOMY  04/01/2012   Procedure: LUMBAR LAMINECTOMY/DECOMPRESSION MICRODISCECTOMY 2 LEVELS;  Surgeon: Faythe Ghee, MD;  Location: [facility];  Service: Neurosurgery;  Laterality: Left;  Lumbar four-five, lumbar five sacral one microdiscectomy    LUMBAR WOUND DEBRIDEMENT  04/29/2012   Procedure: LUMBAR WOUND DEBRIDEMENT;  Surgeon: Faythe Ghee, MD;  Location: [facility];  Service: Neurosurgery;  Laterality: N/A;  lumbar wound debridement   ROTATOR CUFF REPAIR     Right shoulder   TUBAL LIGATION      Social History   Socioeconomic History   Marital status: Divorced    Spouse name: Not on file   Number of children: 2   Years of education: 12   Highest education level: Not on file  Occupational History    Comment: disabled  Tobacco Use   Smoking status: Former    Packs/day: 1.00    Years: 24.00    Total pack years: 24.00    Types: Cigarettes    Quit date: 12/16/2020    Years since quitting:  1.3   Smokeless tobacco: Never   Tobacco comments:     last cigarette on 12/16/2020  Vaping Use   Vaping Use: Every day   Substances: Nicotine  Substance and Sexual Activity   Alcohol use: No    Alcohol/week: 0.0 standard drinks of alcohol    Comment: quit Nov. 2014   Drug use: Not Currently    Types: Cocaine   Sexual activity: Yes    Birth control/protection: Surgical  Other Topics Concern   Not on file  Social History Narrative   Lives alone   Caffeine - coffee 1 cup daily   Social Determinants of Health   Financial Resource Strain: Not on file  Food Insecurity: Not on file  Transportation Needs: Not on file  Physical Activity: Not on file  Stress: Not on file  Social Connections: Not on file  Intimate Partner Violence: Not on file     Family History  Problem Relation Age of Onset   Diabetes Mother    Hypertension Mother    Diabetes Father    Heart disease Paternal Aunt    Cancer Paternal Aunt    Esophageal cancer Neg Hx    Stomach cancer Neg Hx     Current Outpatient Medications  Medication Sig Dispense Refill   albuterol (VENTOLIN HFA) 108 (90 Base) MCG/ACT inhaler Inhale 1-2 puffs into the lungs every 6 (six) hours as needed for wheezing or shortness of breath. 1 each 6   ARIPiprazole (ABILIFY) 20 MG tablet Take 20 mg by mouth daily.     aspirin 81 MG chewable tablet Chew 1 tablet (81 mg total) by mouth daily. 30 tablet 0   atorvastatin (LIPITOR) 80 MG tablet Take 1 tablet (80 mg total) by mouth daily. 30 tablet 0   Budeson-Glycopyrrol-Formoterol (BREZTRI AEROSPHERE) 160-9-4.8 MCG/ACT AERO Inhale 2 puffs into the lungs in the morning and at bedtime. 4.8 g 6   Dulaglutide (TRULICITY) 8.18 EX/9.3ZJ SOPN Inject 0.75 mg into the skin every Sunday.     DULoxetine (CYMBALTA) 30 MG capsule Take 30 mg by mouth 2 (two) times daily.     gabapentin (NEURONTIN) 300 MG capsule Take 300 mg by mouth 3 (three) times daily.      glipiZIDE (GLUCOTROL XL) 10 MG 24 hr tablet Take  10 mg by mouth daily with breakfast.      metFORMIN (GLUCOPHAGE) 1000 MG tablet Take  1,000 mg by mouth 2 (two) times daily with a meal.      montelukast (SINGULAIR) 10 MG tablet Take 1 tablet (10 mg total) by mouth at bedtime. 30 tablet 11   Oxycodone HCl 10 MG TABS Take 10 mg by mouth every 4 (four) hours as needed.     Oxycodone HCl 10 MG TABS Take 1 tablet (10 mg total) by mouth 4 (four) times daily as needed. (Patient not taking: Reported on 10/27/2021) 120 tablet 0   Oxycodone HCl 10 MG TABS Take 1 tablet (10 mg total) by mouth 5 (five) times daily as needed 150 tablet 0   Oxycodone HCl 10 MG TABS Take 1 tablet (10 mg total) by mouth every 4 (four) hours as needed. 180 tablet 0   tiZANidine (ZANAFLEX) 4 MG tablet Take 4 mg by mouth every 8 (eight) hours as needed.     topiramate (TOPAMAX) 50 MG tablet Take 50 mg by mouth daily.     traZODone (DESYREL) 50 MG tablet Take 50 mg by mouth at bedtime.     triamterene-hydrochlorothiazide (MAXZIDE-25) 37.5-25 MG tablet Take 1 tablet by mouth daily. 90 tablet 1   valsartan (DIOVAN) 80 MG tablet Take 1 tablet (80 mg total) by mouth daily. 90 tablet 1   Vitamin D, Ergocalciferol, (DRISDOL) 1.25 MG (50000 UNIT) CAPS capsule Take 50,000 Units by mouth once a week.     No current facility-administered medications for this visit.    No Known Allergies  REVIEW OF SYSTEMS:   '[X]'$  denotes positive finding, '[ ]'$  denotes negative finding Cardiac  Comments:  Chest pain or chest pressure:    Shortness of breath upon exertion:    Short of breath when lying flat:    Irregular heart rhythm:        Vascular    Pain in calf, thigh, or hip brought on by ambulation:    Pain in feet at night that wakes you up from your sleep:     Blood clot in your veins:    Leg swelling:  x       Pulmonary    Oxygen at home:    Productive cough:     Wheezing:         Neurologic    Sudden weakness in arms or legs:     Sudden numbness in arms or legs:     Sudden onset  of difficulty speaking or slurred speech:    Temporary loss of vision in one eye:     Problems with dizziness:         Gastrointestinal    Blood in stool:     Vomited blood:         Genitourinary    Burning when urinating:     Blood in urine:        Psychiatric    Major depression:         Hematologic    Bleeding problems:    Problems with blood clotting too easily:        Skin    Rashes or ulcers:        Constitutional    Fever or chills:      PHYSICAL EXAMINATION:  Today's Vitals   04/07/22 1313  BP: 115/77  Pulse: 100  Resp: 20  Temp: 98.2 F (36.8 C)  TempSrc: Temporal  SpO2: 96%  Weight: 266 lb 6.4 oz (120.8 kg)  Height: '5\' 2"'$  (1.575 m)  PainSc: 10-Worst pain ever   Body mass  index is 48.73 kg/m.   General:  WDWN in NAD; vital signs documented above Gait: Not observed HENT: WNL, normocephalic Pulmonary: normal non-labored breathing without wheezing Cardiac: regular HR; without carotid bruits Abdomen: soft, NT, no masses; aortic pulse is not palpable Skin: without rashes Vascular Exam/Pulses: Palpable bilateral radial pulses Brisk multiphasic DP doppler signals bilaterally Extremities: +BLE swelling; -stemmer sign bilaterally  Neurologic: A&O X 3;  moving all extremities equally Psychiatric:  The pt has Normal affect.   Non-Invasive Vascular Imaging:   Venous duplex on 04/02/2022: Venous Reflux Times  +--------------+---------+------+-----------+------------+--------+  RIGHT         Reflux NoRefluxReflux TimeDiameter cmsComments                          Yes                                   +--------------+---------+------+-----------+------------+--------+  CFV                     yes   >1 second                       +--------------+---------+------+-----------+------------+--------+  FV mid        no                                              +--------------+---------+------+-----------+------------+--------+   Popliteal     no                                              +--------------+---------+------+-----------+------------+--------+  GSV at [facility]    no                            0.40              +--------------+---------+------+-----------+------------+--------+  GSV prox thighno                            0.56              +--------------+---------+------+-----------+------------+--------+  GSV mid thigh no                            0.47              +--------------+---------+------+-----------+------------+--------+  GSV dist thighno                            0.45              +--------------+---------+------+-----------+------------+--------+  GSV at knee   no                            0.37              +--------------+---------+------+-----------+------------+--------+  SSV Pop Fossa no  0.31              +--------------+---------+------+-----------+------------+--------+  SSV prox calf no                                              +--------------+---------+------+-----------+------------+--------+   Summary:  Right:  - No evidence of deep vein thrombosis from the common femoral through the popliteal veins.  - No evidence of superficial venous thrombosis.  - The common femoral vein is not competent.  - The great and small saphenous veins are competent.    Lori Bean is a 51 y.o. female who presents with: BLE swelling with right > left  -pt has brisk bilateral multiphasic DP doppler signals -pt does not have evidence of DVT.  Pt does have venous reflux in the right common femoral vein.  She does not have reflux in the GSV or SSV and therefore is not a candidate for a laser ablation.  On duplex in 2018, she also had deep reflux in the left CFV.  Her leg swelling is most likely multifactorial with hx of CHF and deep venous reflux -discussed with pt about wearing knee high 15-20 mmHg  compression stockings, however, she cannot tolerate these so I wrapped her legs from the feet to the knees with a 4 and a 6 inch ace wrap.  I have asked her to do this daily.   -discussed the importance of leg elevation and how to elevate properly - pt is advised to elevate their legs and a diagram is given to them to demonstrate for pt to lay flat on their back with knees elevated and slightly bent with their feet higher than their knees, which puts their feet higher than their heart for 15 minutes per day.  If pt cannot lay flat, advised to lay as flat as possible.  -pt is advised to continue as much walking as possible and avoid sitting or standing for long periods of time.  -discussed importance of weight loss and exercise and that water aerobics would also be beneficial.  She is in a weight loss program and has been loosing weight.   -handout with recommendations given -pt will f/u as needed.    Leontine Locket, [facility] [phone]  Clinic MD:  Carlis Abbott

## 2022-04-07 ENCOUNTER — Other Ambulatory Visit (HOSPITAL_COMMUNITY): Payer: Self-pay

## 2022-04-07 ENCOUNTER — Encounter (HOSPITAL_COMMUNITY): Payer: Medicaid Other

## 2022-04-07 ENCOUNTER — Ambulatory Visit: Payer: Medicaid Other | Admitting: Physician Assistant

## 2022-04-07 VITALS — BP 115/77 | HR 100 | Temp 98.2°F | Resp 20 | Ht 62.0 in | Wt 266.4 lb

## 2022-04-07 DIAGNOSIS — M7989 Other specified soft tissue disorders: Secondary | ICD-10-CM | POA: Diagnosis not present

## 2022-04-07 MED ORDER — OXYCODONE HCL 10 MG PO TABS
10.0000 mg | ORAL_TABLET | Freq: Every day | ORAL | 0 refills | Status: DC
  Filled 2022-04-07 (×2): qty 120, 24d supply, fill #0
  Filled 2022-04-10: qty 120, 30d supply, fill #0

## 2022-04-09 ENCOUNTER — Other Ambulatory Visit (HOSPITAL_COMMUNITY): Payer: Self-pay

## 2022-04-10 ENCOUNTER — Other Ambulatory Visit (HOSPITAL_COMMUNITY): Payer: Self-pay

## 2022-04-13 ENCOUNTER — Other Ambulatory Visit: Payer: Self-pay | Admitting: Gastroenterology

## 2022-04-13 ENCOUNTER — Other Ambulatory Visit (HOSPITAL_COMMUNITY): Payer: Self-pay | Admitting: Gastroenterology

## 2022-04-13 DIAGNOSIS — K743 Primary biliary cirrhosis: Secondary | ICD-10-CM

## 2022-04-28 ENCOUNTER — Other Ambulatory Visit: Payer: Self-pay | Admitting: Radiology

## 2022-04-28 DIAGNOSIS — K743 Primary biliary cirrhosis: Secondary | ICD-10-CM

## 2022-04-29 ENCOUNTER — Ambulatory Visit (HOSPITAL_COMMUNITY): Admission: RE | Admit: 2022-04-29 | Payer: Medicaid Other | Source: Ambulatory Visit

## 2022-05-08 ENCOUNTER — Other Ambulatory Visit (HOSPITAL_COMMUNITY): Payer: Self-pay

## 2022-05-08 MED ORDER — METHOCARBAMOL 500 MG PO TABS
500.0000 mg | ORAL_TABLET | Freq: Three times a day (TID) | ORAL | 1 refills | Status: DC | PRN
  Filled 2022-05-08: qty 90, 30d supply, fill #0
  Filled 2022-06-17 – 2022-06-18 (×3): qty 90, 30d supply, fill #1

## 2022-05-08 MED ORDER — NALOXONE HCL 4 MG/0.1ML NA LIQD
NASAL | 11 refills | Status: DC
  Filled 2022-05-08: qty 1, 30d supply, fill #0

## 2022-05-08 MED ORDER — OXYCODONE HCL 10 MG PO TABS
10.0000 mg | ORAL_TABLET | Freq: Every day | ORAL | 0 refills | Status: DC
  Filled 2022-05-08: qty 180, 30d supply, fill #0

## 2022-05-13 ENCOUNTER — Other Ambulatory Visit: Payer: Self-pay | Admitting: Radiology

## 2022-05-13 ENCOUNTER — Other Ambulatory Visit: Payer: Self-pay | Admitting: Physician Assistant

## 2022-05-13 DIAGNOSIS — K743 Primary biliary cirrhosis: Secondary | ICD-10-CM

## 2022-05-14 ENCOUNTER — Ambulatory Visit (HOSPITAL_COMMUNITY)
Admission: RE | Admit: 2022-05-14 | Discharge: 2022-05-14 | Disposition: A | Discharge status: Home / Self Care | Payer: No Typology Code available for payment source | Source: Ambulatory Visit | Attending: Gastroenterology | Admitting: Gastroenterology

## 2022-05-14 ENCOUNTER — Encounter (HOSPITAL_COMMUNITY): Payer: Self-pay

## 2022-05-14 DIAGNOSIS — G4730 Sleep apnea, unspecified: Secondary | ICD-10-CM | POA: Insufficient documentation

## 2022-05-14 DIAGNOSIS — E1140 Type 2 diabetes mellitus with diabetic neuropathy, unspecified: Secondary | ICD-10-CM | POA: Diagnosis not present

## 2022-05-14 DIAGNOSIS — F209 Schizophrenia, unspecified: Secondary | ICD-10-CM | POA: Insufficient documentation

## 2022-05-14 DIAGNOSIS — I110 Hypertensive heart disease with heart failure: Secondary | ICD-10-CM | POA: Diagnosis not present

## 2022-05-14 DIAGNOSIS — I509 Heart failure, unspecified: Secondary | ICD-10-CM | POA: Diagnosis not present

## 2022-05-14 DIAGNOSIS — F419 Anxiety disorder, unspecified: Secondary | ICD-10-CM | POA: Insufficient documentation

## 2022-05-14 DIAGNOSIS — J449 Chronic obstructive pulmonary disease, unspecified: Secondary | ICD-10-CM | POA: Insufficient documentation

## 2022-05-14 DIAGNOSIS — K7581 Nonalcoholic steatohepatitis (NASH): Secondary | ICD-10-CM | POA: Diagnosis not present

## 2022-05-14 DIAGNOSIS — F17200 Nicotine dependence, unspecified, uncomplicated: Secondary | ICD-10-CM | POA: Insufficient documentation

## 2022-05-14 DIAGNOSIS — K219 Gastro-esophageal reflux disease without esophagitis: Secondary | ICD-10-CM | POA: Insufficient documentation

## 2022-05-14 DIAGNOSIS — K743 Primary biliary cirrhosis: Secondary | ICD-10-CM | POA: Insufficient documentation

## 2022-05-14 DIAGNOSIS — I252 Old myocardial infarction: Secondary | ICD-10-CM | POA: Insufficient documentation

## 2022-05-14 DIAGNOSIS — Z9049 Acquired absence of other specified parts of digestive tract: Secondary | ICD-10-CM | POA: Diagnosis not present

## 2022-05-14 DIAGNOSIS — E7800 Pure hypercholesterolemia, unspecified: Secondary | ICD-10-CM | POA: Insufficient documentation

## 2022-05-14 DIAGNOSIS — M549 Dorsalgia, unspecified: Secondary | ICD-10-CM | POA: Insufficient documentation

## 2022-05-14 DIAGNOSIS — Z86718 Personal history of other venous thrombosis and embolism: Secondary | ICD-10-CM | POA: Diagnosis not present

## 2022-05-14 DIAGNOSIS — F319 Bipolar disorder, unspecified: Secondary | ICD-10-CM | POA: Diagnosis not present

## 2022-05-14 LAB — CBC
HCT: 43.3 % (ref 36.0–46.0)
Hemoglobin: 14.5 g/dL (ref 12.0–15.0)
MCH: 32 pg (ref 26.0–34.0)
MCHC: 33.5 g/dL (ref 30.0–36.0)
MCV: 95.6 fL (ref 80.0–100.0)
Platelets: 214 10*3/uL (ref 150–400)
RBC: 4.53 MIL/uL (ref 3.87–5.11)
RDW: 14.6 % (ref 11.5–15.5)
WBC: 10.2 10*3/uL (ref 4.0–10.5)
nRBC: 0 % (ref 0.0–0.2)

## 2022-05-14 LAB — COMPREHENSIVE METABOLIC PANEL
ALT: 26 U/L (ref 0–44)
AST: 15 U/L (ref 15–41)
Albumin: 3.8 g/dL (ref 3.5–5.0)
Alkaline Phosphatase: 70 U/L (ref 38–126)
Anion gap: 10 (ref 5–15)
BUN: 12 mg/dL (ref 6–20)
CO2: 25 mmol/L (ref 22–32)
Calcium: 9.6 mg/dL (ref 8.9–10.3)
Chloride: 105 mmol/L (ref 98–111)
Creatinine, Ser: 0.74 mg/dL (ref 0.44–1.00)
GFR, Estimated: >60 mL/min (ref >60)
Glucose, Bld: 113 mg/dL — ABNORMAL HIGH (ref 70–99)
Potassium: 4.4 mmol/L (ref 3.5–5.1)
Sodium: 140 mmol/L (ref 135–145)
Total Bilirubin: 0.9 mg/dL (ref 0.3–1.2)
Total Protein: 7.5 g/dL (ref 6.5–8.1)

## 2022-05-14 LAB — GLUCOSE, CAPILLARY: Glucose-Capillary: 106 mg/dL — ABNORMAL HIGH (ref 70–99)

## 2022-05-14 LAB — PROTIME-INR
INR: 1 (ref 0.8–1.2)
Prothrombin Time: 12.8 seconds (ref 11.4–15.2)

## 2022-05-14 MED ORDER — LIDOCAINE HCL 1 % IJ SOLN
INTRAMUSCULAR | Status: AC
  Administered 2022-05-14: 10 mL
  Filled 2022-05-14: qty 20

## 2022-05-14 MED ORDER — GELATIN ABSORBABLE 12-7 MM EX MISC
CUTANEOUS | Status: AC
  Administered 2022-05-14: 1
  Filled 2022-05-14: qty 1

## 2022-05-14 MED ORDER — MIDAZOLAM HCL 2 MG/2ML IJ SOLN
INTRAMUSCULAR | Status: AC | PRN
  Administered 2022-05-14 (×2): 1 mg via INTRAVENOUS

## 2022-05-14 MED ORDER — SODIUM CHLORIDE 0.9 % IV SOLN: INTRAVENOUS | Status: DC

## 2022-05-14 MED ORDER — HYDROCODONE-ACETAMINOPHEN 5-325 MG PO TABS: 1.0000 | ORAL_TABLET | ORAL | Status: DC | PRN

## 2022-05-14 MED ORDER — FENTANYL CITRATE (PF) 100 MCG/2ML IJ SOLN
INTRAMUSCULAR | Status: AC | PRN
  Administered 2022-05-14 (×2): 50 ug via INTRAVENOUS

## 2022-05-14 NOTE — Discharge Instructions (Signed)
Liver Biopsy, Care After  May remove dressing or bandaid and shower tomorrow.  Keep site clean and dry. Replace with clean dressing or bandaid as necessary.   Urgent needs - Interventional Radiology clinic [phone]  After a liver biopsy, it is common to have these things in the area where the biopsy was done. You may: Have pain. Feel sore. Have bruising. You may also feel tired for a few days. Follow these instructions at home: Medicines Take over-the-counter and prescription medicines only as told by your doctor. If you were prescribed an antibiotic medicine, take it as told by your doctor. Do not stop taking the antibiotic, even if you start to feel better. Do not take medicines that may thin your blood. These medicines include aspirin and ibuprofen. Take them only if your doctor tells you to. If told, take steps to prevent problems with pooping (constipation). You may need to: Drink enough fluid to keep your pee (urine) pale yellow. Take medicines. You will be told what medicines to take. Eat foods that are high in fiber. These include beans, whole grains, and fresh fruits and vegetables. Limit foods that are high in fat and sugar. These include fried or sweet foods. Ask your doctor if you should avoid driving or using machines while you are taking your medicine. Caring for your incision Follow instructions from your doctor about how to take care of your cut from surgery (incisions). Make sure you: Wash your hands with soap and water for at least 20 seconds before and after you change your bandage. If you cannot use soap and water, use hand sanitizer. Change your bandage. Leavestitches or skin glue in place for at least two weeks. Leave tape strips alone unless you are told to take them off. You may trim the edges of the tape strips if they curl up. Check your incision every day for signs of infection. Check for: Redness, swelling, or more pain. Fluid or blood. Warmth. Pus or a  bad smell. Do not take baths, swim, or use a hot tub. Ask your doctor about taking showers or sponge baths. Activity Rest at home for 1-2 days, or as told by your doctor. Get up to take short walks every 1 to 2 hours. Ask for help if you feel weak or unsteady. Do not lift anything that is heavier than 10 lb (4.5 kg), or the limit that you are told. Do not play contact sports for 2 weeks after the procedure. Return to your normal activities as told by your doctor. Ask what activities are safe for you. General instructions Do not drink alcohol in the first week after the procedure. Plan to have a responsible adult care for you for the time you are told after you leave the hospital or clinic. This is important. It is up to you to get the results of your procedure. Ask how to get your results when they are ready. Keep all follow-up visits.   Contact a doctor if: You have more bleeding in your incision. Your incision swells, or is red and more painful. You have fluid that comes from your incision. You develop a rash. You have fever or chills. Get help right away if: You have swelling, bloating, or pain in your belly (abdomen). You get dizzy or faint. You vomit or you feel like vomiting. You have trouble breathing or feel short of breath. You have chest pain. You have problems talking or seeing. You have trouble with your balance or moving your arms or   legs. These symptoms may be an emergency. Get help right away. Call your local emergency services (911 in the [place] Do not wait to see if the symptoms will go away. Do not drive yourself to the hospital. Summary After the procedure, it is common to have pain, soreness, bruising, and tiredness. Your doctor will tell you how to take care of yourself at home. Change your bandage, take your medicines, and limit your activities as told by your doctor. Call your doctor if you have symptoms of infection. Get help right away if your belly swells,  your cut bleeds a lot, or you have trouble talking or breathing. This information is not intended to replace advice given to you by your health care provider. Make sure you discuss any questions you have with your healthcare provider. Document Revised: 07/15/2020 Document Reviewed: 07/15/2020 Elsevier Patient Education  2022 [employer]  Moderate Conscious Sedation, Adult, Care After This sheet gives you information about how to care for yourself after your procedure. Your health care provider may also give you more specific instructions. If you have problems or questions, contact your health careprovider. What can I expect after the procedure? After the procedure, it is common to have: Sleepiness for several hours. Impaired judgment for several hours. Difficulty with balance. Vomiting if you eat too soon. Follow these instructions at home: For the time period you were told by your health care provider: Rest. Do not participate in activities where you could fall or become injured. Do not drive or use machinery. Do not drink alcohol. Do not take sleeping pills or medicines that cause drowsiness. Do not make important decisions or sign legal documents. Do not take care of children on your own. Eating and drinking  Follow the diet recommended by your health care provider. Drink enough fluid to keep your urine pale yellow. If you vomit: Drink water, juice, or soup when you can drink without vomiting. Make sure you have little or no nausea before eating solid foods.  General instructions Take over-the-counter and prescription medicines only as told by your health care provider. Have a responsible adult stay with you for the time you are told. It is important to have someone help care for you until you are awake and alert. Do not smoke. Keep all follow-up visits as told by your health care provider. This is important. Contact a health care provider if: You are still sleepy or having  trouble with balance after 24 hours. You feel light-headed. You keep feeling nauseous or you keep vomiting. You develop a rash. You have a fever. You have redness or swelling around the IV site. Get help right away if: You have trouble breathing. You have new-onset confusion at home. Summary After the procedure, it is common to feel sleepy, have impaired judgment, or feel nauseous if you eat too soon. Rest after you get home. Know the things you should not do after the procedure. Follow the diet recommended by your health care provider and drink enough fluid to keep your urine pale yellow. Get help right away if you have trouble breathing or new-onset confusion at home. This information is not intended to replace advice given to you by your health care provider. Make sure you discuss any questions you have with your healthcare provider. Document Revised: 12/29/2019 Document Reviewed: 07/27/2019 Elsevier Patient Education  2022 Elsevier Inc.        

## 2022-05-14 NOTE — H&P (Signed)
Referring Physician(s): GGYIRS,WNIOEVO  Supervising Physician: Markus Daft  Patient Status:  [facility]  Chief Complaint:  "I'm here for a liver biopsy"  Subjective: Patient known to IR service from PICC placement in 2017.  She is a 51 year old female smoker  with past medical history significant for vitamin D deficiency, obesity ,GERD, remote LE DVT, COPD, diabetes, hypercholesterolemia, anxiety, CHF, bipolar disorder/schizophrenia, lymphedema, asthma, sleep apnea, hypertension, prior MI, urinary/fecal incontinence who presents now with previous history of elevated LFTs and concern for possible primary biliary cholangitis.  Previous imaging also reveals fatty liver/possible cirrhosis along with F2 fibrosis on fibroscan.  She also has positive ANA and AMA. She is scheduled today for image guided random core liver biopsy for further evaluation. She denies fever, headache, chest pain, dyspnea, cough, abdominal pain, nausea, vomiting or bleeding.  She does have back pain.  Past Medical History:  Diagnosis Date   Abdominal hernia    Anginal pain ([place])    occ from asthma   Anxiety    Arthritis    Asthma    Bipolar affective disorder ([place])    Schizophrenia   Bronchitis    hx of   CHF (congestive heart failure) ([place])    Depression    Diabetes mellitus without complication ([place])    " New Onset " per patient   GERD (gastroesophageal reflux disease)    Heart murmur    Hypertension    takes meds   Lymphedema 06/07/2018   Morbid (severe) obesity due to excess calories ([facility])    Neuropathy    left leg , "from back surgery"   Overactive bladder    Schizophrenia ([place])    Sciatica    Shortness of breath    Sleep apnea    Snoring disorder    Pt stated my boyfriend always wakes me up and tells me to breathe   Past Surgical History:  Procedure Laterality Date   BACK SURGERY     3 back surgeries   CHOLECYSTECTOMY     COLONOSCOPY WITH PROPOFOL N/A 01/23/2016   Procedure: COLONOSCOPY WITH  PROPOFOL;  Surgeon: Wonda Horner, MD;  Location: [facility];  Service: Endoscopy;  Laterality: N/A;   EYE SURGERY     Metal plate in right eye. Had fracture in right eye   gallstones reomved     KNEE ARTHROSCOPY WITH MENISCAL REPAIR Right 09/28/2016   Procedure: KNEE ARTHROSCOPY WITH MENISCAL REPAIR;  Surgeon: Dorna Leitz, MD;  Location: [facility];  Service: Orthopedics;  Laterality: Right;  Right partial meniscectomy and chondroplasty, patellar/femoral joint and medial femoral condyle    LUMBAR LAMINECTOMY/DECOMPRESSION MICRODISCECTOMY  04/01/2012   Procedure: LUMBAR LAMINECTOMY/DECOMPRESSION MICRODISCECTOMY 2 LEVELS;  Surgeon: Faythe Ghee, MD;  Location: [facility];  Service: Neurosurgery;  Laterality: Left;  Lumbar four-five, lumbar five sacral one microdiscectomy    LUMBAR WOUND DEBRIDEMENT  04/29/2012   Procedure: LUMBAR WOUND DEBRIDEMENT;  Surgeon: Faythe Ghee, MD;  Location: [facility];  Service: Neurosurgery;  Laterality: N/A;  lumbar wound debridement   ROTATOR CUFF REPAIR     Right shoulder   TUBAL LIGATION        Allergies: Patient has no known allergies.  Medications: Prior to Admission medications   Medication Sig Start Date End Date Taking? Authorizing Provider  albuterol (VENTOLIN HFA) 108 (90 Base) MCG/ACT inhaler Inhale 1-2 puffs into the lungs every 6 (six) hours as needed for wheezing or shortness of breath. 08/22/21   Laurin Coder, MD  ARIPiprazole (ABILIFY) 20 MG tablet Take 20 mg by mouth daily. 05/20/21   [provider]  aspirin 81 MG chewable tablet Chew 1 tablet (81 mg total) by mouth daily. Patient not taking: Reported on 04/23/2022 04/03/21   Little Ishikawa, MD  atorvastatin (LIPITOR) 80 MG tablet Take 1 tablet (80 mg total) by mouth daily. Patient not taking: Reported on 04/23/2022 04/03/21   Little Ishikawa, MD  Budeson-Glycopyrrol-Formoterol (BREZTRI AEROSPHERE) 160-9-4.8 MCG/ACT AERO Inhale 2 puffs into the lungs in the morning  and at bedtime. 08/22/21   Laurin Coder, MD  Diethylpropion HCl 25 MG TABS Take 25 mg by mouth daily. Take with 75 mg to equal 100 mg daily    [provider]  Diethylpropion HCl CR 75 MG TB24 Take 75 mg by mouth daily. Take with 25 mg to equal 100 mg qd    [provider]  Dulaglutide (TRULICITY) 3.71 IR/6.7EL SOPN Inject 0.75 mg into the skin every Sunday.    [provider]  DULoxetine (CYMBALTA) 20 MG capsule Take 20 mg by mouth 2 (two) times daily.    [provider]  gabapentin (NEURONTIN) 300 MG capsule Take 300 mg by mouth 3 (three) times daily.  02/02/19   [provider]  glipiZIDE (GLUCOTROL XL) 10 MG 24 hr tablet Take 10 mg by mouth daily with breakfast.     [provider]  metFORMIN (GLUCOPHAGE) 1000 MG tablet Take 1,000 mg by mouth 2 (two) times daily with a meal.     [provider]  methocarbamol (ROBAXIN) 500 MG tablet Take 500 mg by mouth every 6 (six) hours as needed for muscle spasms.    [provider]  methocarbamol (ROBAXIN) 500 MG tablet Take 1 tablet (500 mg total) by mouth 3 (three) times daily as needed for muscle spasms 05/08/22     metoprolol succinate (TOPROL-XL) 25 MG 24 hr tablet Take 25 mg by mouth daily.    [provider]  montelukast (SINGULAIR) 10 MG tablet Take 1 tablet (10 mg total) by mouth at bedtime. Patient not taking: Reported on 04/23/2022 09/20/19   Martyn Ehrich, NP  naloxone [facility]) nasal spray 4 mg/0.1 mL 1 spray every 2 minutes as needed for opioid overdose; spray 1 dose into ONE nostril; alternate nostrils w each dose until help arrives 05/08/22     Oxycodone HCl 10 MG TABS Take 1 tablet (10 mg total) by mouth 4 (four) times daily as needed. Patient not taking: Reported on 04/23/2022 10/13/21     Oxycodone HCl 10 MG TABS Take 1 tablet (10 mg total) by mouth 5 (five) times daily as needed Patient not taking: Reported on 04/23/2022 11/11/21     Oxycodone HCl 10 MG TABS  Take 1 tablet (10 mg total) by mouth every 4 (four) hours as needed. Patient not taking: Reported on 04/23/2022 03/05/22     Oxycodone HCl 10 MG TABS Take 1 tablet (10 mg total) by mouth 6 (six) times daily as needed for pain 05/08/22     tolterodine (DETROL LA) 4 MG 24 hr capsule Take 4 mg by mouth daily.    [provider]  topiramate (TOPAMAX) 100 MG tablet Take 100 mg by mouth at bedtime.    [provider]  Triamcinolone Acetonide 0.025 % LOTN Apply 1 Application topically 2 (two) times daily.    [provider]  triamterene-hydrochlorothiazide (MAXZIDE-25) 37.5-25 MG tablet Take 1 tablet by mouth daily. 06/05/21   Laurann Montana  S, NP  valsartan (DIOVAN) 80 MG tablet Take 1 tablet (80 mg total) by mouth daily. 06/05/21   Loel Dubonnet, NP  Vitamin D, Ergocalciferol, (DRISDOL) 1.25 MG (50000 UNIT) CAPS capsule Take 50,000 Units by mouth every Sunday. 04/22/21   [provider]     Vital Signs: BP (!) 210/96 Comment: RN notified  Temp 98.2 F (36.8 C) (Oral)   Resp 20   LMP 10/15/2018 Comment: patient had pregnancy test with PCP (test was negative)  SpO2 95%   Physical Exam awake, alert.  Chest with distant breath sounds bilaterally.  Heart with regular rate and rhythm.   Abdomen obese, soft, positive bowel sounds, nontender.  Extremities with full range of motion.  Imaging: No results found.  Labs:  CBC: No results for input(s): "WBC", "HGB", "HCT", "PLT" in the last 8760 hours.  COAGS: No results for input(s): "INR", "APTT" in the last 8760 hours.  BMP: Recent Labs    06/30/21 0925  NA 140  K 3.9  CL 106  CO2 25  GLUCOSE 126*  BUN 16  CALCIUM 9.7  CREATININE 0.86  GFRNONAA >60    LIVER FUNCTION TESTS: No results for input(s): "BILITOT", "AST", "ALT", "ALKPHOS", "PROT", "ALBUMIN" in the last 8760 hours.  Assessment and Plan: Patient known to IR service from PICC placement in 2017.  She is a 51 year old female smoker  with past  medical history significant for vitamin D deficiency, obesity ,GERD, remote LE DVT, COPD, diabetes, hypercholesterolemia, anxiety, CHF, bipolar disorder/schizophrenia, lymphedema, asthma, sleep apnea, hypertension, prior MI, urinary/fecal incontinence who presents now with previous history of elevated LFTs and concern for possible primary biliary cholangitis.  Previous imaging also reveals fatty liver/possible cirrhosis along with F2 fibrosis on fibroscan.  She also has positive ANA and AMA. She is scheduled today for image guided random core liver biopsy for further evaluation.Risks and benefits of procedure was discussed with the patient  including, but not limited to bleeding, infection, damage to adjacent structures or low yield requiring additional tests.  All of the questions were answered and there is agreement to proceed.  Consent signed and in chart.  LABS PENDING  Electronically Signed: D. Rowe Robert, PA-C 05/14/2022, 11:42 AM   I spent a total of 25 Minutes at the the patient's bedside AND on the patient's hospital floor or unit, greater than 50% of which was counseling/coordinating care for image  guided random core liver biopsy

## 2022-05-14 NOTE — Procedures (Signed)
Interventional Radiology Procedure:   Indications: Concern for primary biliary cholangitis  Procedure: US guided liver biopsy - random  Findings: Echogenic liver.  3 cores from left hepatic lobe.   Complications: No immediate complications noted.     EBL: Minimal  Plan: Bedrest 3 hours   Lori Behnke R. Anselm Pancoast, MD  Pager: [phone]

## 2022-05-21 LAB — SURGICAL PATHOLOGY

## 2022-05-25 ENCOUNTER — Ambulatory Visit (HOSPITAL_BASED_OUTPATIENT_CLINIC_OR_DEPARTMENT_OTHER): Payer: Medicaid Other | Admitting: Family

## 2022-05-25 NOTE — Progress Notes (Deleted)
Office Visit    Patient Name: Lori Bean Date of Encounter: 05/25/2022  PCP:  [name], PA   [place]  Cardiologist:  [name], MD  Advanced Practice Provider:  No care team member to display Electrophysiologist:  None     Chief Complaint    Lori Bean is a 51 y.o. female with a hx of chronic diastolic heart failure, hypertensive heart disease, asthma, OSA on CPAP, lymphedema, schizophrenia, bipolar disorder, obesity, prior PE presents today for heart failure follow-up  Past Medical History    Past Medical History:  Diagnosis Date   Abdominal hernia    Anginal pain ([place])    occ from asthma   Anxiety    Arthritis    Asthma    Bipolar affective disorder ([place])    Schizophrenia   Bronchitis    hx of   CHF (congestive heart failure) ([place])    Depression    Diabetes mellitus without complication ([place])    " New Onset " per patient   GERD (gastroesophageal reflux disease)    Heart murmur    Hypertension    takes meds   Lymphedema 06/07/2018   Morbid (severe) obesity due to excess calories ([facility])    Neuropathy    left leg , "from back surgery"   Overactive bladder    Schizophrenia ([place])    Sciatica    Shortness of breath    Sleep apnea    Snoring disorder    Pt stated my boyfriend always wakes me up and tells me to breathe   Past Surgical History:  Procedure Laterality Date   BACK SURGERY     3 back surgeries   CHOLECYSTECTOMY     COLONOSCOPY WITH PROPOFOL N/A 01/23/2016   Procedure: COLONOSCOPY WITH PROPOFOL;  Surgeon: Wonda Horner, MD;  Location: [facility];  Service: Endoscopy;  Laterality: N/A;   EYE SURGERY     Metal plate in right eye. Had fracture in right eye   gallstones reomved     KNEE ARTHROSCOPY WITH MENISCAL REPAIR Right 09/28/2016   Procedure: KNEE ARTHROSCOPY WITH MENISCAL REPAIR;  Surgeon: Dorna Leitz, MD;  Location: [facility];  Service: Orthopedics;  Laterality: Right;  Right partial  meniscectomy and chondroplasty, patellar/femoral joint and medial femoral condyle    LUMBAR LAMINECTOMY/DECOMPRESSION MICRODISCECTOMY  04/01/2012   Procedure: LUMBAR LAMINECTOMY/DECOMPRESSION MICRODISCECTOMY 2 LEVELS;  Surgeon: Faythe Ghee, MD;  Location: [facility];  Service: Neurosurgery;  Laterality: Left;  Lumbar four-five, lumbar five sacral one microdiscectomy    LUMBAR WOUND DEBRIDEMENT  04/29/2012   Procedure: LUMBAR WOUND DEBRIDEMENT;  Surgeon: Faythe Ghee, MD;  Location: [facility];  Service: Neurosurgery;  Laterality: N/A;  lumbar wound debridement   ROTATOR CUFF REPAIR     Right shoulder   TUBAL LIGATION      Allergies  No Known Allergies  History of Present Illness    SYESHA Bean is a 51 y.o. female with a hx of chronic diastolic heart failure, hypertensive heart disease, asthma, OSA on CPAP, lymphedema, schizophrenia, bipolar disorder, obesity, prior PE last seen 06/05/21.  She was admitted 03/28/21 with acute hypercarbic respiratory failure requiring intubation.  Cardiology was consulted for elevated troponin.  Cardiac CT which was less than ideal given body habitus though coronary calcium score of 0.  Troponin was deemed demand ischemia and no further ischemic evaluation warranted.  Her home antihypertensive regimen was resumed.  Seen 06/05/2021 noted feeling dyspneic since  hospital discharge but was overall stable.  She was referred to prep exercise program at the [facility].  She was recommended for follow-up in 3 to 4 months but this was not completed.  Urgent care visit 06/2021 with leg pain, lower extremity duplex with no DVT.  Urgent care visit 06/2019 who with asthma.  Follows with Dr. [name] regarding her OSA.  Saw vascular surgery 04/07/2022 with reflux studies showing reflux in right common femoral vein but no evidence of DVT.  Not a candidate for laser ablation.  Recommended for compression stockings which she did not tolerate so instead taught how to wrap  her legs.  Encouraged to walk as much as possible and exercise regularly.  She presents today for follow-up. ***  Tells me she is still "short winded" since hospital discharge but this is overall stable. No chest pain, pressure, tightness. Tells me her LE edema is stable at her baseline which she attributes to her lymphedema. Does try to sit with feet elevated but does not use her compression wraps often as she tells me they are too hot. No formal exercise routine but interested in starting one.   ***  EKGs/Labs/Other Studies Reviewed:   The following studies were reviewed today: CT coronary study 04/01/21:   IMPRESSION: 1. Coronary calcium score of 0. This was 0 percentile for age and sex matched control.   2.  Normal coronary origin with right dominance.   3. No evidence of calcified plaque. Poor quality study for non calcified plaque with possible high grade soft plaque in the mid LCx. The distal LAD is not visualized.   4. The study was not submitted for FFR analysis due to poor quality and significant stitch artifact.   5. Recommend cardiac cath or nuclear stress testing if clinical suspicion high.     Echo from 03/29/21:    1. Left ventricular ejection fraction, by estimation, is 60 to 65%. The  left ventricle has normal function. The left ventricle has no regional  wall motion abnormalities. Left ventricular diastolic parameters were  normal.   2. Right ventricular systolic function is normal. The right ventricular  size is normal.   3. The mitral valve is normal in structure. No evidence of mitral valve  regurgitation. No evidence of mitral stenosis.   4. The aortic valve is normal in structure. Aortic valve regurgitation is  not visualized. No aortic stenosis is present.   5. The inferior vena cava is normal in size with greater than 50%  respiratory variability, suggesting right atrial pressure of 3 mmHg.   Comparison(s): Prior images unable to be directly viewed,  comparison made  by report only.   EKG:  No EKG ordered today. ***  Recent Labs: 05/14/2022: ALT 26; BUN 12; Creatinine, Ser 0.74; Hemoglobin 14.5; Platelets 214; Potassium 4.4; Sodium 140  Recent Lipid Panel    Component Value Date/Time   CHOL 141 04/02/2021 0548   CHOL 148 06/07/2018 1024   TRIG 135 04/02/2021 0548   HDL 48 04/02/2021 0548   HDL 51 06/07/2018 1024   CHOLHDL 2.9 04/02/2021 0548   VLDL 27 04/02/2021 0548   LDLCALC 66 04/02/2021 0548   LDLCALC 76 06/07/2018 1024    Home Medications   No outpatient medications have been marked as taking for the 05/25/22 encounter (Appointment) with Loel Dubonnet, NP.     Review of Systems      All other systems reviewed and are otherwise negative except as noted above.  Physical Exam  VS:  LMP 10/15/2018 Comment: patient had pregnancy test with PCP (test was negative) , BMI There is no height or weight on file to calculate BMI.  Wt Readings from Last 3 Encounters:  04/07/22 266 lb 6.4 oz (120.8 kg)  12/01/21 263 lb (119.3 kg)  10/27/21 263 lb 3.2 oz (119.4 kg)    *** GEN: Well nourished, overweight, well developed, in no acute distress. HEENT: normal. Neck: Supple, no JVD, carotid bruits, or masses. Cardiac: RRR, no murmurs, rubs, or gallops. No clubbing, cyanosis, edema.  Radials/PT 2+ and equal bilaterally.  Respiratory:  Respirations regular and unlabored, clear to auscultation bilaterally. GI: Soft, nontender, nondistended. MS: No deformity or atrophy. Skin: Warm and dry, no rash. Neuro:  Strength and sensation are intact. Psych: Normal affect.  Assessment & Plan   *** Elevated troponin -noted during recent admission in the setting of hypercarbic respiratory failure requiring intubation.  CT coronary 04/01/2021 poor study with coronary calcium score of 0.  Troponin elevation likely demand ischemia. No recurrent chest pain, pressure, tightness.   Chronic diastolic heart failure-echocardiogram 03/29/2021 with  LVEF 60 to 65%, no RWMA, normal diastolic parameters, no significant valvular normalities. Grossly euvolemic on exam. Low salt diet and fluid restriction <2L encouraged. Continue triamterene-hctz. No indication for loop diuretic at this time.   Hyperlipidemia-most recent LDL 66.  Continue present dose of atorvastatin.  Hypertension- Elevated in clinic but reports well controlled at home with home readings 120s/80s. She will contact our office for Bp persistently >130/80. She has not yet taken her medications this morning. Continue present antihypertensive regimen, refills provided. Referred to PREP program at [facility] to start exercise program. Heart healthy diet and regular cardiovascular exercise encouraged.    Asthma - Continue to follow with PCP and pulmonology. No sign of acute exacerbation.    OSA - CPAP compliance encouraged. Congratulated on using regularly.   Obesity - Weight loss via diet and exercise encouraged. Discussed the impact being overweight would have on cardiovascular risk. Referred to PREP exercise program at [facility].   Schizophrenia/Bipolar disorder - Continue to follow with PCP.   DM2 - 03/28/21 A1c 7.2. Ideally <7. Continue to follow with PCP.   Lymphedema - Continue lower extremity elevation. Encouraged low salt diet and utilization of her compression hose.   Disposition: Follow up in 3-4 month(s) with Dr. Oval Linsey or APP.  Signed, Loel Dubonnet, NP 05/25/2022, 1:51 PM [place]

## 2022-05-31 IMAGING — CT CT ABD-PELV W/ CM
1 of 3 series · 13 of 32 positions shown, 19 images · IV contrast (APPLIED)
Comparison: 05/02/2019

CLINICAL DATA: Abdominal pain, diarrhea, vomiting

EXAM:
CT ABDOMEN AND PELVIS WITH CONTRAST
TECHNIQUE: Multidetector CT imaging of the abdomen and pelvis was performed
using the standard protocol following bolus administration of
intravenous contrast.
CONTRAST:  100mL [id number] IOPAMIDOL ([id number]) INJECTION 61%

[Series 2: abd/pelvis w/cm · axial · 0.96mm/px · z∈[-442,-52]mm · 13 of 90 slices shown, 19 images]
[im 6/90  soft-tissue]
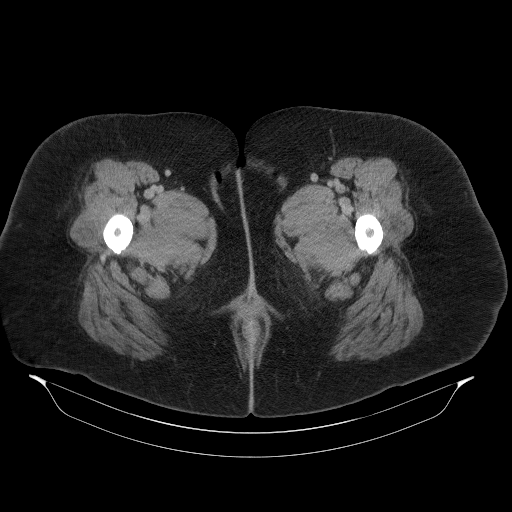
[im 6/90  bone]
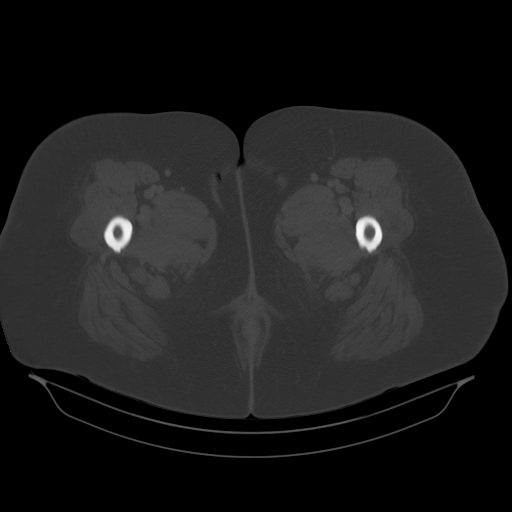
[im 12/90  soft-tissue]
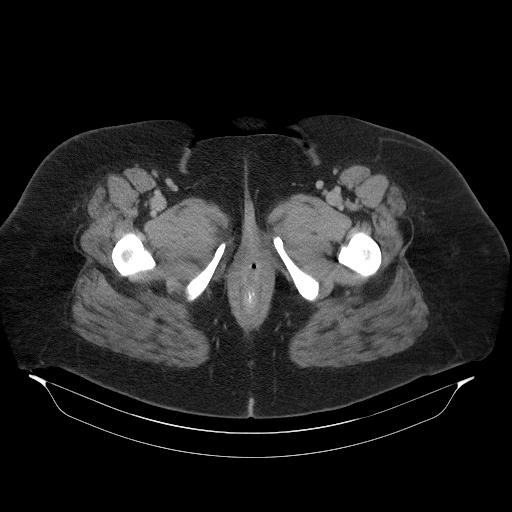
[im 18/90  soft-tissue]
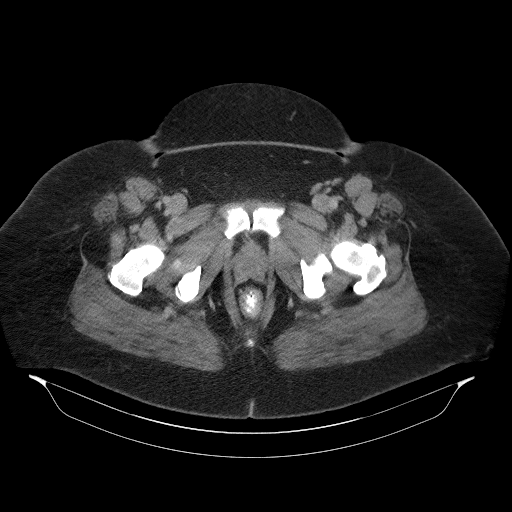
[im 24/90  soft-tissue]
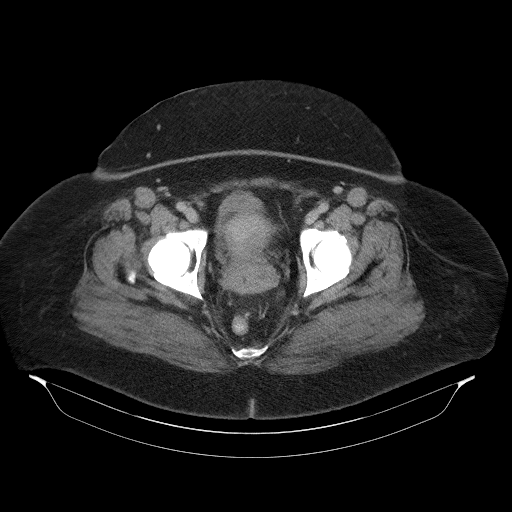
[im 30/90  soft-tissue]
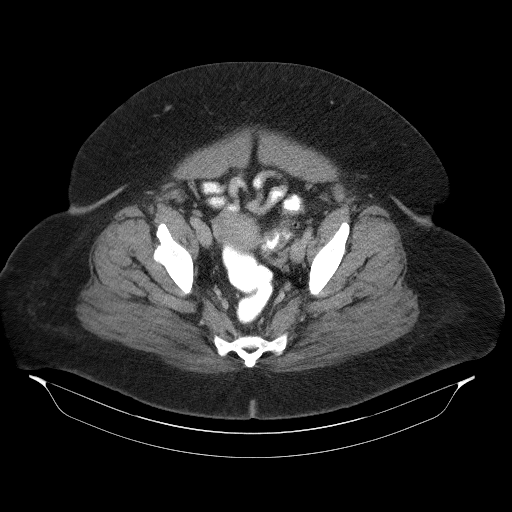
[im 36/90  soft-tissue]
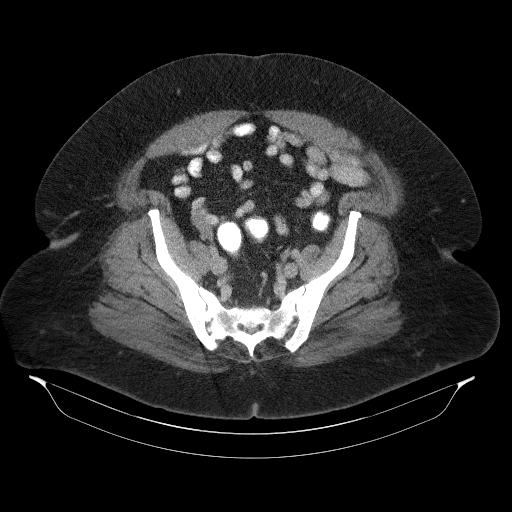
[im 48/90  soft-tissue]
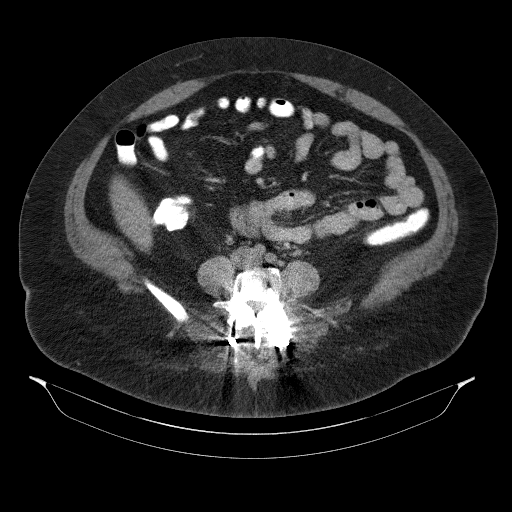
[im 54/90  soft-tissue]
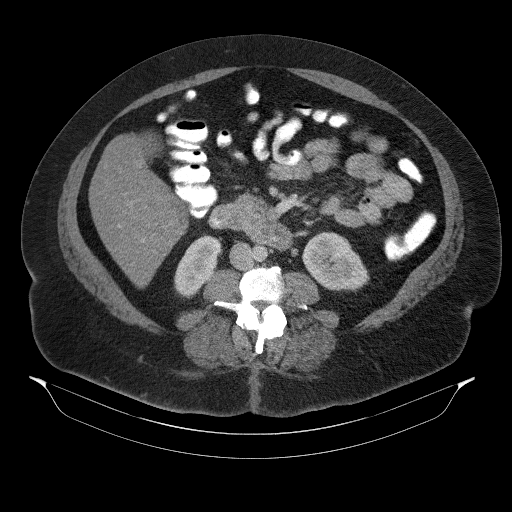
[im 60/90  soft-tissue]
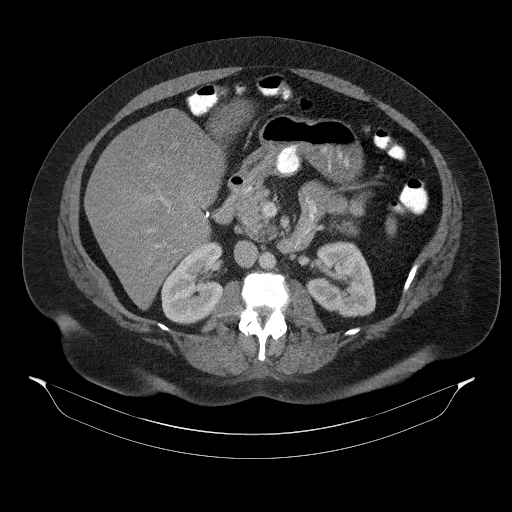
[im 60/90  bone]
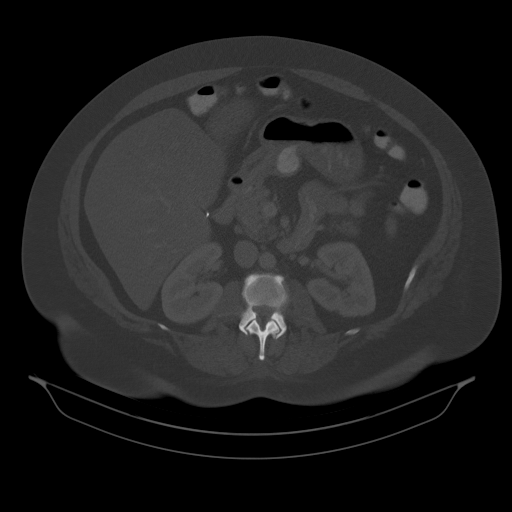
[im 66/90  soft-tissue]
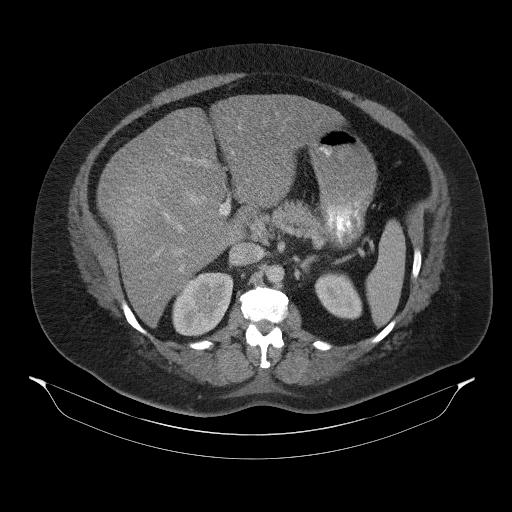
[im 66/90  lung]
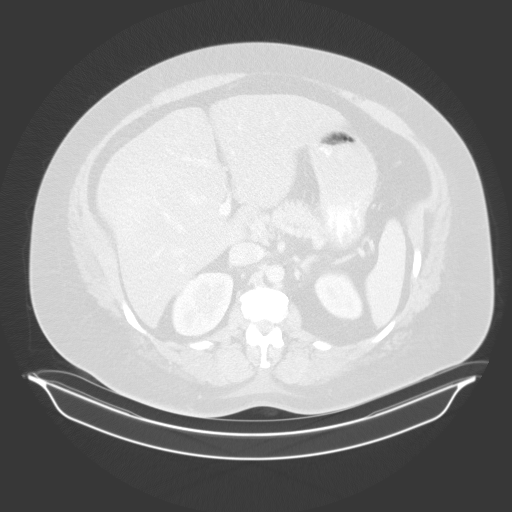
[im 72/90  soft-tissue]
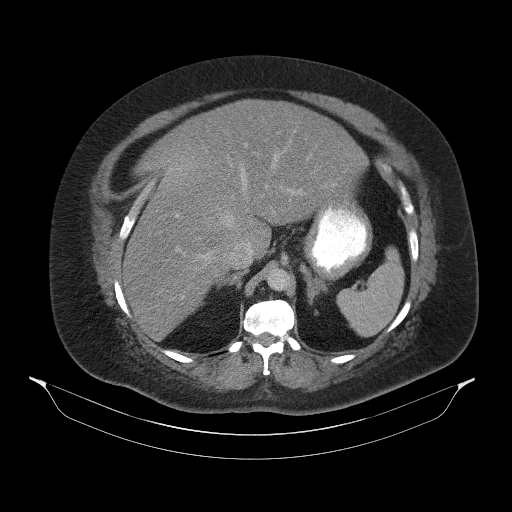
[im 72/90  lung]
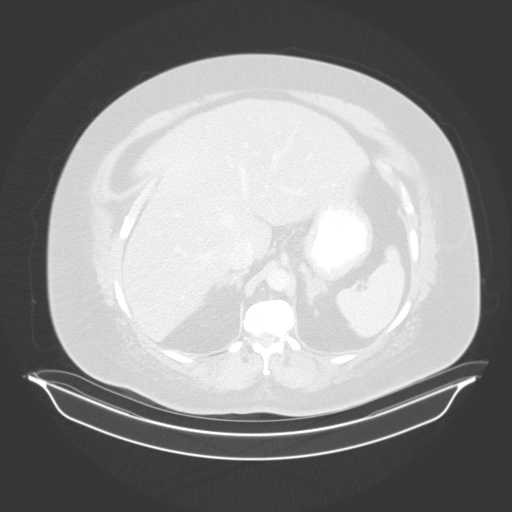
[im 78/90  soft-tissue]
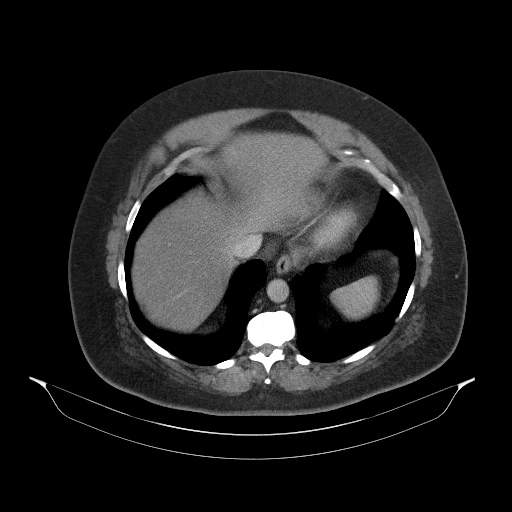
[im 78/90  lung]
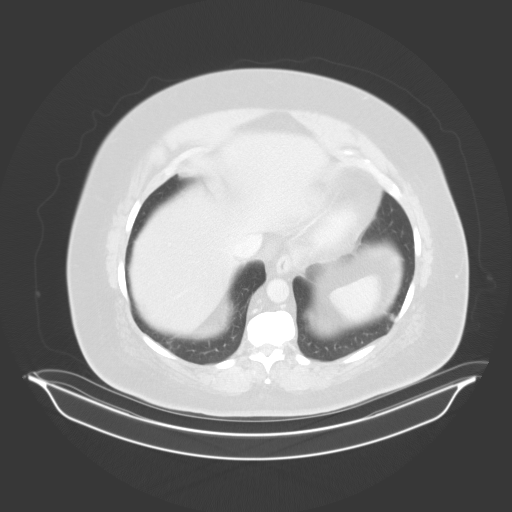
[im 84/90  soft-tissue]
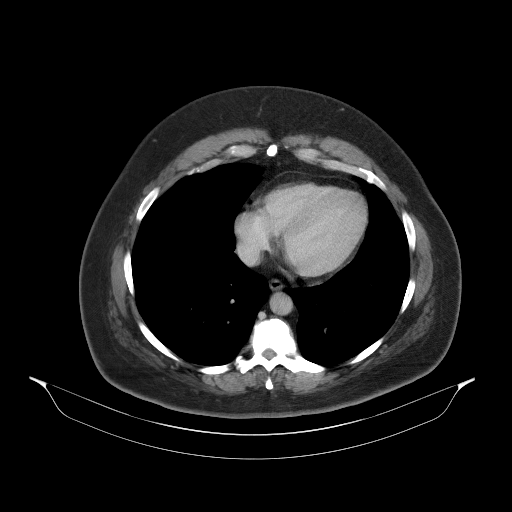
[im 84/90  lung]
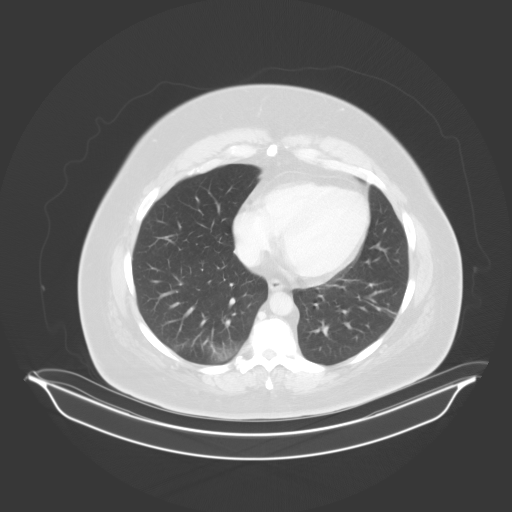

[13 of 32 positions shown; findings below may reference images not displayed]

FINDINGS: Lower chest: Minimal atelectasis within the right lung base. The
visualized heart and pericardium are unremarkable.

Hepatobiliary: Cholecystectomy has been performed. Moderate to
severe hepatic steatosis. Mild hepatomegaly. These appear unchanged
from prior examination. No focal intrahepatic mass identified. No
intra or extrahepatic biliary ductal dilation.

Pancreas: Unremarkable

Spleen: Unremarkable

Adrenals/Urinary Tract: The adrenal glands are unremarkable. The
kidneys are normal. The bladder is largely decompressed, but is
unremarkable.

Stomach/Bowel: The stomach, small bowel, and large bowel are
unremarkable save for a few scattered diverticula within the sigmoid
colon. Appendix normal. No free intraperitoneal gas or fluid.

Vascular/Lymphatic: No significant vascular findings are present. No
enlarged abdominal or pelvic lymph nodes.

Reproductive: Uterus and bilateral adnexa are unremarkable.

Other: Rectum unremarkable.

Musculoskeletal: L4-5 fusion with instrumentation has been
performed. No acute bone abnormality.
IMPRESSION: 1. No acute intra-abdominal or intrapelvic pathology.
2. Moderate to severe hepatic steatosis with mild hepatomegaly.

## 2022-06-08 ENCOUNTER — Other Ambulatory Visit (HOSPITAL_COMMUNITY): Payer: Self-pay

## 2022-06-08 MED ORDER — OXYCODONE HCL 10 MG PO TABS
10.0000 mg | ORAL_TABLET | Freq: Every day | ORAL | 0 refills | Status: DC
  Filled 2022-06-08: qty 165, 28d supply, fill #0
  Filled 2022-06-08: qty 15, 2d supply, fill #0

## 2022-06-10 IMAGING — DX DG KNEE COMPLETE 4+V*L*
4 series · 4 of 4 positions shown · non-contrast
Comparison: Left knee series 01/07/2013.

CLINICAL DATA: 49-year-old female status post fall on the left knee
3 days ago with continued pain.

EXAM:
LEFT KNEE - COMPLETE 4+ VIEW

[knee ap]
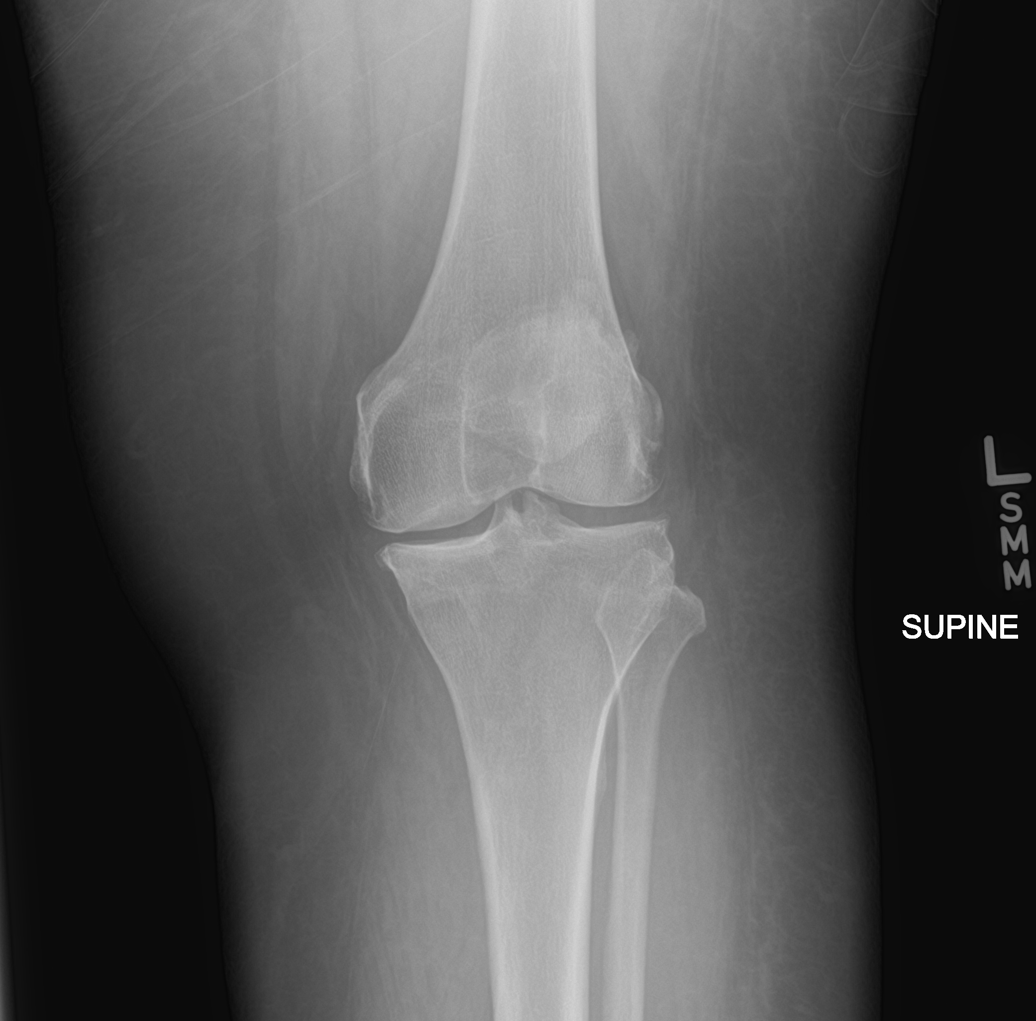

[knee obl (1 of 2)]
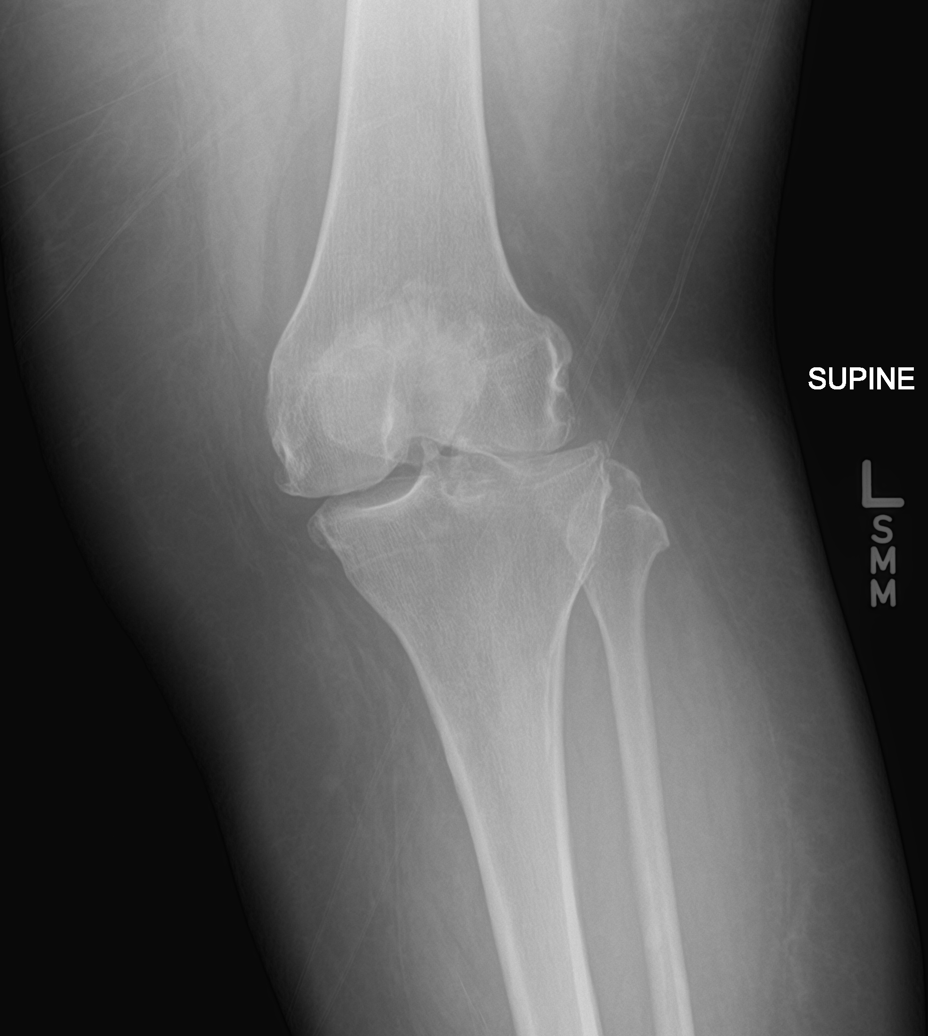

[knee obl (2 of 2)]
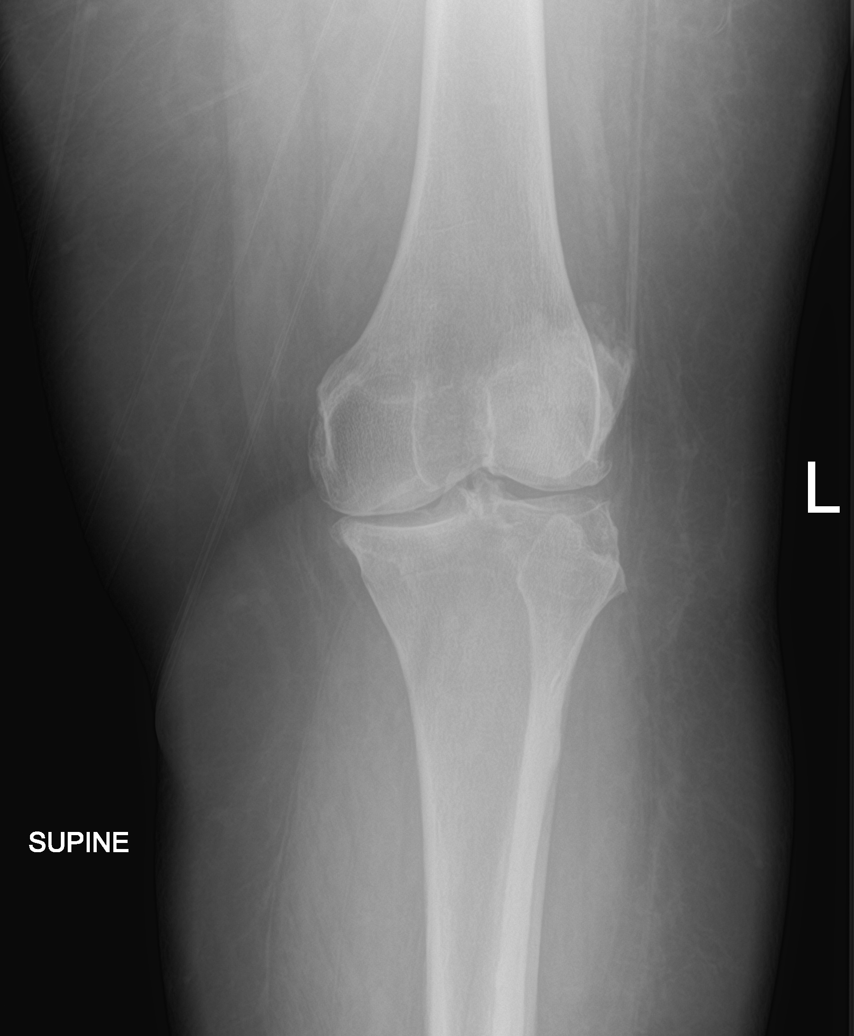

[knee lat]
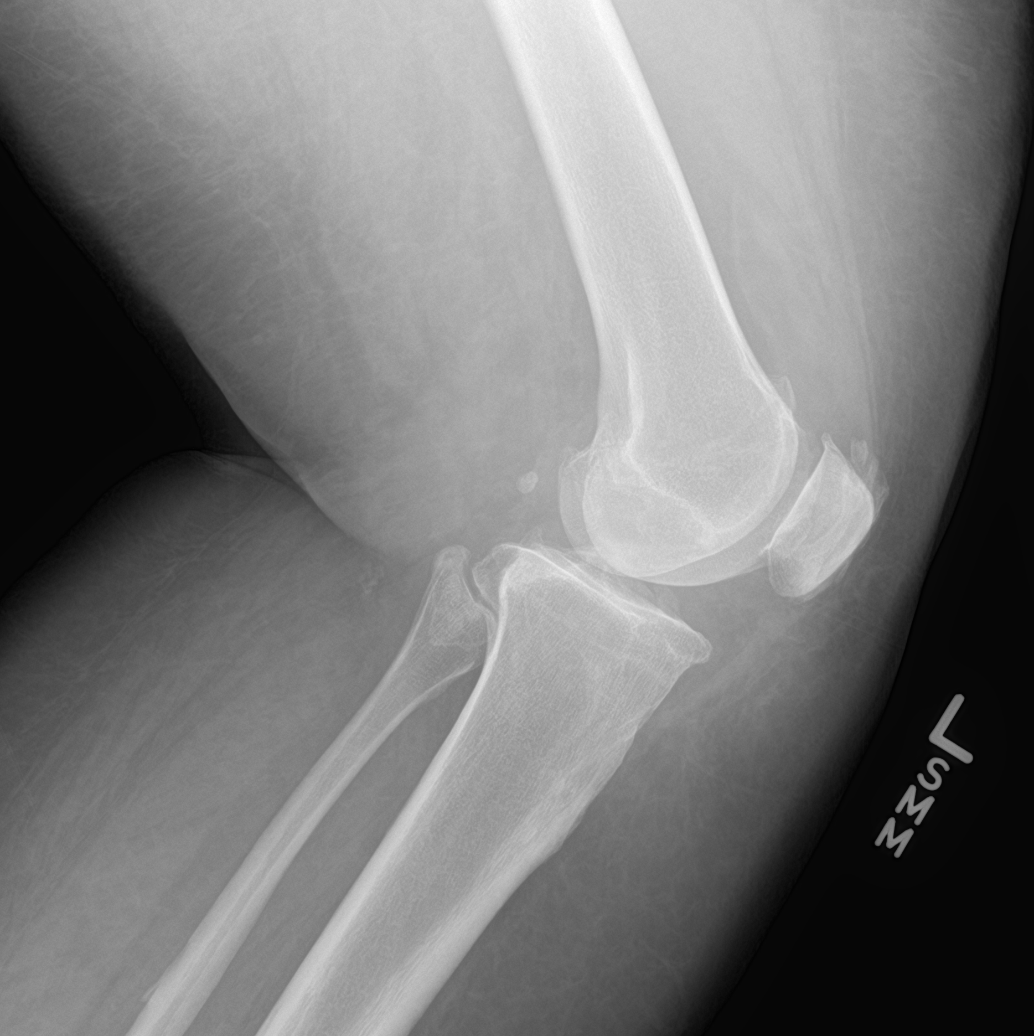

[4 of 4 positions shown; findings below may reference images not displayed]

FINDINGS: Progressed tricompartmental degenerative spurring since 4067.
Progressed degenerative spurring at the tibia intercondylar
eminence. Small suprapatellar joint effusion suspected and new. Mild
to moderate medial compartment joint space loss has progressed. No
acute osseous abnormality identified. No discrete soft tissue
injury.
IMPRESSION: 1. New small joint effusion. No acute osseous abnormality
identified.
2. Progressed tricompartmental degenerative changes since [DATE].

## 2022-06-17 ENCOUNTER — Other Ambulatory Visit (HOSPITAL_COMMUNITY): Payer: Self-pay

## 2022-06-18 ENCOUNTER — Other Ambulatory Visit (HOSPITAL_COMMUNITY): Payer: Self-pay

## 2022-06-26 ENCOUNTER — Encounter (HOSPITAL_BASED_OUTPATIENT_CLINIC_OR_DEPARTMENT_OTHER): Payer: Self-pay | Admitting: Family

## 2022-06-26 ENCOUNTER — Other Ambulatory Visit (HOSPITAL_BASED_OUTPATIENT_CLINIC_OR_DEPARTMENT_OTHER): Payer: Self-pay | Admitting: Family

## 2022-06-26 ENCOUNTER — Ambulatory Visit (INDEPENDENT_AMBULATORY_CARE_PROVIDER_SITE_OTHER): Payer: PRIVATE HEALTH INSURANCE | Admitting: Family

## 2022-06-26 ENCOUNTER — Telehealth: Payer: Self-pay | Admitting: Cardiovascular Disease

## 2022-06-26 VITALS — BP 164/82 | HR 97 | Ht 62.0 in | Wt 265.1 lb

## 2022-06-26 DIAGNOSIS — G4733 Obstructive sleep apnea (adult) (pediatric): Secondary | ICD-10-CM

## 2022-06-26 DIAGNOSIS — I10 Essential (primary) hypertension: Secondary | ICD-10-CM | POA: Diagnosis not present

## 2022-06-26 DIAGNOSIS — Z6841 Body Mass Index (BMI) 40.0 and over, adult: Secondary | ICD-10-CM

## 2022-06-26 DIAGNOSIS — I5041 Acute combined systolic (congestive) and diastolic (congestive) heart failure: Secondary | ICD-10-CM

## 2022-06-26 DIAGNOSIS — I890 Lymphedema, not elsewhere classified: Secondary | ICD-10-CM

## 2022-06-26 DIAGNOSIS — E782 Mixed hyperlipidemia: Secondary | ICD-10-CM

## 2022-06-26 MED ORDER — ATORVASTATIN CALCIUM 40 MG PO TABS: 40.0000 mg | ORAL_TABLET | Freq: Every day | ORAL | 3 refills | Status: DC

## 2022-06-26 MED ORDER — TRIAMTERENE-HCTZ 37.5-25 MG PO TABS: 1.0000 | ORAL_TABLET | Freq: Every day | ORAL | 3 refills | Status: DC

## 2022-06-26 NOTE — Patient Instructions (Addendum)
Medication Instructions:  Your physician has recommended you make the following change in your medication:   RESUME Triamterene-HCTZ one tablet daily  RESUME Atorvastatin '40mg'$  daily  Recommend discuss talking with your primary care provider about possibly switching from Trulicity to [place]   *If you need a refill on your cardiac medications before your next appointment, please call your pharmacy*   Lab Work: Your physician recommends that you return for lab work in 1-2 weeks for [facility]  Your physician recommends that you return for lab work in 6-8 weeks for fasting lipid panel, liver function tests  Please return for Lab work. You may come to the...   [facility] (3rd floor) [address] [place], [place] [address]  Open: 8am-Noon and 1pm-4:30pm  Please ring the doorbell on the small table when you exit the elevator and the Lab Tech will come get you  [place] [facility] [address] [place], [place], [place] [address] Open: 8am-1pm, then 2pm-4:30pm   [place]- Please see attached locations sheet stapled to your lab work with address and hours.     If you have labs (blood work) drawn today and your tests are completely normal, you will receive your results only by: [place] (if you have MyChart) OR A paper copy in the mail If you have any lab test that is abnormal or we need to change your treatment, we will call you to review the results.  Follow-Up: At [facility], you and your health needs are our priority.  As part of our continuing mission to provide you with exceptional heart care, we have created designated Provider Care Teams.  These Care Teams include your primary Cardiologist (physician) and Advanced Practice Providers (APPs -  Physician Assistants and Nurse Practitioners) who all work together to provide you with the care you need, when you need it.  We recommend signing up for the patient portal called  "MyChart".  Sign up information is provided on this After Visit Summary.  MyChart is used to connect with patients for Virtual Visits (Telemedicine).  Patients are able to view lab/test results, encounter notes, upcoming appointments, etc.  Non-urgent messages can be sent to your provider as well.   To learn more about what you can do with MyChart, go to [email or web address].    Your next appointment:   3 month(s)  The format for your next appointment:   In Person  Provider:   [name], MD or Laurann Montana, NP    Other Instructions  Heart Healthy Diet Recommendations: A low-salt diet is recommended. Meats should be grilled, baked, or boiled. Avoid fried foods. Focus on lean protein sources like fish or chicken with vegetables and fruits. The American Heart Association is a Microbiologist!  American Heart Association Diet and Lifeystyle Recommendations   Exercise recommendations: The American Heart Association recommends 150 minutes of moderate intensity exercise weekly. Try 30 minutes of moderate intensity exercise 4-5 times per week. This could include walking, jogging, or swimming.   Important Information About Sugar

## 2022-06-26 NOTE — Progress Notes (Signed)
Office Visit    Patient Name: Lori Bean Date of Encounter: 06/26/2022  PCP:  [name], PA   [place]  Cardiologist:  [name], MD  Advanced Practice Provider:  Loel Dubonnet, NP Electrophysiologist:  None     Chief Complaint    Lori Bean is a 51 y.o. female presents today for heart failure follow-up  Past Medical History    Past Medical History:  Diagnosis Date   Abdominal hernia    Anginal pain ([place])    occ from asthma   Anxiety    Arthritis    Asthma    Bipolar affective disorder ([place])    Schizophrenia   Bronchitis    hx of   CHF (congestive heart failure) ([place])    Depression    Diabetes mellitus without complication ([place])    " New Onset " per patient   GERD (gastroesophageal reflux disease)    Heart murmur    Hypertension    takes meds   Lymphedema 06/07/2018   Morbid (severe) obesity due to excess calories ([facility])    Neuropathy    left leg , "from back surgery"   Overactive bladder    Schizophrenia ([place])    Sciatica    Shortness of breath    Sleep apnea    Snoring disorder    Pt stated my boyfriend always wakes me up and tells me to breathe   Past Surgical History:  Procedure Laterality Date   BACK SURGERY     3 back surgeries   CHOLECYSTECTOMY     COLONOSCOPY WITH PROPOFOL N/A 01/23/2016   Procedure: COLONOSCOPY WITH PROPOFOL;  Surgeon: Wonda Horner, MD;  Location: [facility];  Service: Endoscopy;  Laterality: N/A;   EYE SURGERY     Metal plate in right eye. Had fracture in right eye   gallstones reomved     KNEE ARTHROSCOPY WITH MENISCAL REPAIR Right 09/28/2016   Procedure: KNEE ARTHROSCOPY WITH MENISCAL REPAIR;  Surgeon: Dorna Leitz, MD;  Location: [facility];  Service: Orthopedics;  Laterality: Right;  Right partial meniscectomy and chondroplasty, patellar/femoral joint and medial femoral condyle    LUMBAR LAMINECTOMY/DECOMPRESSION MICRODISCECTOMY  04/01/2012   Procedure: LUMBAR  LAMINECTOMY/DECOMPRESSION MICRODISCECTOMY 2 LEVELS;  Surgeon: Faythe Ghee, MD;  Location: [facility];  Service: Neurosurgery;  Laterality: Left;  Lumbar four-five, lumbar five sacral one microdiscectomy    LUMBAR WOUND DEBRIDEMENT  04/29/2012   Procedure: LUMBAR WOUND DEBRIDEMENT;  Surgeon: Faythe Ghee, MD;  Location: [facility];  Service: Neurosurgery;  Laterality: N/A;  lumbar wound debridement   ROTATOR CUFF REPAIR     Right shoulder   TUBAL LIGATION      Allergies  No Known Allergies  History of Present Illness    Lori Bean is a 51 y.o. female with a hx of chronic diastolic heart failure, hypertensive heart disease, asthma, OSA on CPAP, lymphedema, schizophrenia, bipolar disorder, obesity, prior PE last seen 06/05/21.  She was admitted 03/28/21 with acute hypercarbic respiratory failure requiring intubation.  Cardiology was consulted for elevated troponin.  Cardiac CT which was less than ideal given body habitus though coronary calcium score of 0.  Troponin was deemed demand ischemia and no further ischemic evaluation warranted.  Her home antihypertensive regimen was resumed.  She seen 06/05/2021 in hospital follow-up.  She noted feeling short winded but overall stable.  No chest pain and lower extremity edema stable at baseline due to lymphedema.  She was referred to prep exercise program. Recommended to follow up in 3-4 months which was not completed.  She had liver biopsy at the end of August 2023 and is following her her GI.   She presents today for overdue follow up. She notes that she recently moved on 10/1 which has helped with her stress levels. She mentioned chest pressure during times of high emotional stress which has completely resolved since her move. She is not currently exercising and does not take her blood pressures at home. She had an appointment with her PCP yesterday that she missed and sees her weight loss provider once monthly. She has stopped  taking her Atorvasatain '80mg'$  and her Maxide 37.5-'25mg'$  due to unknown reason. She has been taking Diethylpropion '100mg'$  for the past seven months with little weight loss.   She denies current chest pain/pressure/tightness, SOB, Palpitations, orthopnea, PND. She is having lower leg swelling that has not worsened. She is seeing vascular and is wrapping lower leg nightly.   EKGs/Labs/Other Studies Reviewed:   The following studies were reviewed today:  CT coronary study 04/01/21:   IMPRESSION: 1. Coronary calcium score of 0. This was 0 percentile for age and sex matched control.   2.  Normal coronary origin with right dominance.   3. No evidence of calcified plaque. Poor quality study for non calcified plaque with possible high grade soft plaque in the mid LCx. The distal LAD is not visualized.   4. The study was not submitted for FFR analysis due to poor quality and significant stitch artifact.   5. Recommend cardiac cath or nuclear stress testing if clinical suspicion high.     Echo from 03/29/21:    1. Left ventricular ejection fraction, by estimation, is 60 to 65%. The  left ventricle has normal function. The left ventricle has no regional  wall motion abnormalities. Left ventricular diastolic parameters were  normal.   2. Right ventricular systolic function is normal. The right ventricular  size is normal.   3. The mitral valve is normal in structure. No evidence of mitral valve  regurgitation. No evidence of mitral stenosis.   4. The aortic valve is normal in structure. Aortic valve regurgitation is  not visualized. No aortic stenosis is present.   5. The inferior vena cava is normal in size with greater than 50%  respiratory variability, suggesting right atrial pressure of 3 mmHg.   Comparison(s): Prior images unable to be directly viewed, comparison made  by report only.   EKG: EKG ordered today. EKG performed today demonstrates NSR 97bpm with non specific T wave  changes (TWI V5, V6)  Recent Labs: 05/14/2022: ALT 26; BUN 12; Creatinine, Ser 0.74; Hemoglobin 14.5; Platelets 214; Potassium 4.4; Sodium 140  Recent Lipid Panel    Component Value Date/Time   CHOL 141 04/02/2021 0548   CHOL 148 06/07/2018 1024   TRIG 135 04/02/2021 0548   HDL 48 04/02/2021 0548   HDL 51 06/07/2018 1024   CHOLHDL 2.9 04/02/2021 0548   VLDL 27 04/02/2021 0548   LDLCALC 66 04/02/2021 0548   LDLCALC 76 06/07/2018 1024    Home Medications   Current Meds  Medication Sig   albuterol (VENTOLIN HFA) 108 (90 Base) MCG/ACT inhaler Inhale 1-2 puffs into the lungs every 6 (six) hours as needed for wheezing or shortness of breath.   ARIPiprazole (ABILIFY) 20 MG tablet Take 20 mg by mouth daily.   aspirin 81 MG chewable tablet Chew 1 tablet (81 mg total) by  mouth daily.   Budeson-Glycopyrrol-Formoterol (BREZTRI AEROSPHERE) 160-9-4.8 MCG/ACT AERO Inhale 2 puffs into the lungs in the morning and at bedtime.   Diethylpropion HCl 25 MG TABS Take 25 mg by mouth daily. Take with 75 mg to equal 100 mg daily   Diethylpropion HCl CR 75 MG TB24 Take 75 mg by mouth daily. Take with 25 mg to equal 100 mg qd   Dulaglutide (TRULICITY) 5.02 DX/4.1OI SOPN Inject 0.75 mg into the skin every Sunday.   DULoxetine (CYMBALTA) 20 MG capsule Take 20 mg by mouth 2 (two) times daily.   gabapentin (NEURONTIN) 300 MG capsule Take 300 mg by mouth 3 (three) times daily.    glipiZIDE (GLUCOTROL XL) 10 MG 24 hr tablet Take 10 mg by mouth daily with breakfast.    metFORMIN (GLUCOPHAGE) 1000 MG tablet Take 1,000 mg by mouth 2 (two) times daily with a meal.    methocarbamol (ROBAXIN) 500 MG tablet Take 1 tablet (500 mg total) by mouth 3 (three) times daily as needed for muscle spasms   metoprolol succinate (TOPROL-XL) 50 MG 24 hr tablet Take 50 mg by mouth daily.   naloxone (NARCAN) nasal spray 4 mg/0.1 mL 1 spray every 2 minutes as needed for opioid overdose; spray 1 dose into ONE nostril; alternate nostrils w  each dose until help arrives   Oxycodone HCl 10 MG TABS Take 1 tablet (10 mg total) by mouth 6 (six) times daily as needed for pain.   tolterodine (DETROL LA) 4 MG 24 hr capsule Take 4 mg by mouth daily.   topiramate (TOPAMAX) 100 MG tablet Take 100 mg by mouth at bedtime.   Triamcinolone Acetonide 0.025 % LOTN Apply 1 Application topically 2 (two) times daily.   ursodiol (ACTIGALL) 300 MG capsule Take 300 mg by mouth 2 (two) times daily.   Vitamin D, Ergocalciferol, (DRISDOL) 1.25 MG (50000 UNIT) CAPS capsule Take 50,000 Units by mouth every Sunday.   [DISCONTINUED] triamterene-hydrochlorothiazide (MAXZIDE-25) 37.5-25 MG tablet Take 1 tablet by mouth daily.   [DISCONTINUED] valsartan (DIOVAN) 80 MG tablet Take 1 tablet (80 mg total) by mouth daily.     Review of Systems      All other systems reviewed and are otherwise negative except as noted above.  Physical Exam    VS:  BP (!) 164/82 (BP Location: Left Arm, Patient Position: Sitting, Cuff Size: Large)   Pulse 97   Ht '5\' 2"'$  (1.575 m)   Wt 265 lb 1.6 oz (120.2 kg)   LMP 10/15/2018 Comment: patient had pregnancy test with PCP (test was negative)  BMI 48.49 kg/m  , BMI Body mass index is 48.49 kg/m.  Wt Readings from Last 3 Encounters:  06/26/22 265 lb 1.6 oz (120.2 kg)  04/07/22 266 lb 6.4 oz (120.8 kg)  12/01/21 263 lb (119.3 kg)     GEN: Well nourished, obese, well developed, in no acute distress. HEENT: normal. Neck: Supple, no JVD, carotid bruits, or masses. Cardiac: RRR, no murmurs, rubs, or gallops. No clubbing, cyanosis. Radials/PT 2+ and equal bilaterally. Bilateral 2+ lower leg edema.  Respiratory:  Respirations regular and unlabored, clear to auscultation bilaterally. GI: Soft, nontender, nondistended. MS: No deformity or atrophy. Skin: Warm and dry, no rash. Neuro:  Strength and sensation are intact. Psych: Normal affect.  Assessment & Plan    Chronic diastolic heart failure-echocardiogram 03/29/2021 with LVEF  60 to 65%, no RWMA, normal diastolic parameters, no significant valvular normalities. Grossly euvolemic on exam. Low salt diet and fluid restriction <  2L encouraged. No indication for loop diuretic at this time. Will resume her Triamterene-HCTZ as detailed below.   Hyperlipidemia-  She had stopped taking her Atorvastatin '80mg'$  a year ago. No evidence of CAD however, due to T2DM will restart her Atorvastatin '40mg'$ . Will repeat Lipid panel in 6-8 weeks. She denied prior myalgias.   Hypertension- 164/82 in office today. She does not take her blood pressure at home. She had stopped Triamterene-HCTZ 37.5-'25mg'$  for unknown reasons over the past year. Will restart Triamterene-HCTZ 37.5-'25mg'$ . Discussed to monitor BP at home at least 2 hours after medications and sitting for 5-10 minutes. Will have pt discuss discontinuing her Diethylpropion '100mg'$  with her weight loss provider as this can adversely affect her blood pressure. If BP elevated at follow, will consider increasing Valsartan.   Asthma - She denies SOB. Continue to follow with PCP and pulmonology.   OSA - CPAP compliance encouraged. Congratulated on using regularly.   Obesity - Weight loss via diet and exercise encouraged. Discussed the impact being overweight would have on cardiovascular risk. Referred to PREP exercise program.There is concern regarding transportation discussed she could possibly participate with her daughter.  Schizophrenia/Bipolar disorder - Continue to follow with PCP.   DM2 - Recommended that she speak with her PCP regarding switching from Trulicity to Ozempic as this is provides CVD risk reduction. She does mention minimal weight loss on Trulicity. Ozempic could be more impactful on overall weight loss. Continue to follow with PCP. Will consider starting Farixga during next visit.   Lymphedema - She wraps her legs nightly. Continue lower extremity elevation. Encouraged low salt diet and utilization of her compression  hose.  Disposition: Follow up in 3-4 month(s) with Dr. Oval Linsey or APP.  Signed, Loel Dubonnet, NP 06/26/2022, 4:47 PM [place]

## 2022-06-26 NOTE — Telephone Encounter (Signed)
Returned call to pharmacy. Confirmed prescriptions.    "Continue Valsartan and start Triamterene-HCTZ.   Loel Dubonnet, NP "

## 2022-06-26 NOTE — Telephone Encounter (Signed)
Were we stopping plain valsartan? Sorry I may have missed this just let me know and I will call and clarify with the pharmacy.

## 2022-06-26 NOTE — Telephone Encounter (Signed)
Pt c/o medication issue:  1. Name of Medication: valsartan (DIOVAN) 80 MG tablet triamterene-hydrochlorothiazide (MAXZIDE-25) 37.5-25 MG tablet  2. How are you currently taking this medication (dosage and times per day)?   3. Are you having a reaction (difficulty breathing--STAT)?   4. What is your medication issue? Pharmacy is calling to clarification on both of these medications. They have Valsartan sent in from another place as valsartan with hydrochlorothiazide. Please advise.

## 2022-06-26 NOTE — Telephone Encounter (Signed)
Continue Valsartan and start Triamterene-HCTZ.  Loel Dubonnet, NP

## 2022-07-03 ENCOUNTER — Telehealth: Payer: Self-pay

## 2022-07-03 NOTE — Telephone Encounter (Signed)
Call to pt reference PREP referral Did not have any questions about program Requested assist with transportation in order to participate.  Could come to [facility] however would need a 8am or 9am due to having aide during day.  Advised do not currently have an early morning class available. Will call her should one become available.

## 2022-07-06 ENCOUNTER — Other Ambulatory Visit (HOSPITAL_COMMUNITY): Payer: Self-pay

## 2022-07-27 ENCOUNTER — Ambulatory Visit: Payer: Self-pay | Admitting: General Surgery

## 2022-07-27 NOTE — H&P (Unsigned)
REFERRING PHYSICIAN:  Koleen Distance, MD  PROVIDER:  Monico Blitz, MD  MRN: [id number] DOB: [birth year] DATE OF ENCOUNTER: 07/27/2022  Subjective   Chief Complaint: New Consultation     History of Present Illness: Lori Bean is a 51 y.o. female who is seen today as an office consultation at the request of Dr. Alan Ripper for evaluation of New Consultation .  51 year old female who presents today for evaluation of fecal incontinence.  Patient underwent temporary InterStim placement and developed improvement in her urinary and bowel symptoms.  She had greater then 50% improvement in urinary leaks and fecal accidents.  She is here today to discuss permanent sacral neurostimulator placement.   Review of Systems: A complete review of systems was obtained from the patient.  I have reviewed this information and discussed as appropriate with the patient.  See HPI as well for other ROS.   Medical History: Past Medical History:  Diagnosis Date   Anemia    Anxiety    Arthritis    Asthma, unspecified asthma severity, unspecified whether complicated, unspecified whether persistent    CHF (congestive heart failure) (CMS-HCC)    COPD (chronic obstructive pulmonary disease) (CMS-HCC)    Diabetes mellitus without complication (CMS-HCC)    DVT (deep venous thrombosis) (CMS-HCC)    GERD (gastroesophageal reflux disease)    History of cancer    Hypertension    Sleep apnea     There is no problem list on file for this patient.   Past Surgical History:  Procedure Laterality Date   LUMBAR LAMINECTOMY/DECOMPRESSION MICRODISCECTOMY 2 LEVELS (Left: Back)  04/01/2012   Dr. Hal Neer   LUMBAR WOUND DEBRIDEMENT  04/29/2012   Dr. Hal Neer   POSTERIOR LUMBAR INTERBODY FUSION LUMBAR FIVE-SACRAL ONE, PERCUTANEOUS SCREWS  12/28/2013   Dr. Hal Neer   COLONOSCOPY WITH PROPOFOL  01/23/2016   Dr. Penelope Coop   KNEE ARTHROSCOPY WITH MENISCAL REPAIR (Right: Knee)  09/28/2016   Dr. Devonne Doughty    CHOLECYSTECTOMY       Allergies  Allergen Reactions   Aspirin Vomiting    Current Outpatient Medications on File Prior to Visit  Medication Sig Dispense Refill   ARIPiprazole (ABILIFY) 20 MG tablet Take 20 mg by mouth once daily     atorvastatin (LIPITOR) 40 MG tablet Take 40 mg by mouth once daily     BREZTRI AEROSPHERE 160-9-4.8 mcg/actuation inhaler Inhale into the lungs     ergocalciferol, vitamin D2, 1,250 mcg (50,000 unit) capsule Take by mouth     gabapentin (NEURONTIN) 300 MG capsule Take 300 mg by mouth 3 (three) times daily     metoprolol succinate (TOPROL-XL) 25 MG XL tablet Take 25 mg by mouth once daily     naloxone (NARCAN) 4 mg/actuation nasal spray Place into one nostril     potassium chloride (KLOR-CON) 10 MEQ ER tablet Take 10 mEq by mouth 2 (two) times daily     triamterene-hydroCHLOROthiazide (MAXZIDE-25) 37.5-25 mg tablet Take 1 tablet by mouth once daily     diethylpropion 25 mg Tab Take by mouth     DULoxetine (CYMBALTA) 20 MG DR capsule Take 20 mg by mouth 2 (two) times daily     glipiZIDE (GLUCOTROL XL) 10 MG XL tablet Take 10 mg by mouth daily with breakfast     metFORMIN (GLUCOPHAGE) 1000 MG tablet Take 1,000 mg by mouth 2 (two) times daily     methocarbamoL (ROBAXIN) 500 MG tablet Take 500 mg by mouth 3 (three) times daily as needed  tolterodine (DETROL LA) 4 MG LA capsule Take by mouth     topiramate (TOPAMAX) 100 MG tablet Take 100 mg by mouth at bedtime     triamcinolone 0.025 % lotion Apply 1 Application  topically 2 (two) times daily     TRULICITY 9.32 IZ/1.2 mL subcutaneous pen injector Inject subcutaneously     VENTOLIN HFA 90 mcg/actuation inhaler INHALE 2 PUFFS BY MOUTH FOUR TIMES DAILY     No current facility-administered medications on file prior to visit.    Family History  Problem Relation Age of Onset   High blood pressure (Hypertension) Mother    Diabetes Mother    High blood pressure (Hypertension) Father    Diabetes Father    High  blood pressure (Hypertension) Sister    Diabetes Sister    Obesity Brother    Deep vein thrombosis (DVT or abnormal blood clot formation) Brother    High blood pressure (Hypertension) Brother    Diabetes Brother    Coronary Artery Disease (Blocked arteries around heart) Paternal Aunt      Social History   Tobacco Use  Smoking Status Every Day   Packs/day: 1   Types: Cigarettes  Smokeless Tobacco Not on file     Social History   Socioeconomic History   Marital status: Single  Tobacco Use   Smoking status: Every Day    Packs/day: 1    Types: Cigarettes  Substance and Sexual Activity   Alcohol use: Never   Drug use: Never    Objective:    Vitals:   07/27/22 1017  BP: (!) 146/72  Pulse: 100  Temp: 37.1 C (98.7 F)  SpO2: 97%  Weight: (!) 122.7 kg (270 lb 6.4 oz)  Height: 157.5 cm ('5\' 2"'$ )     Exam Gen: NAD Abd: soft Rectal: Moderate tone, decreased squeeze pressures   Labs, Imaging and Diagnostic Testing:  Procedure: Anoscopy Surgeon: Marcello Moores After the risks and benefits were explained, written consent was obtained for above procedure.  A medical assistant chaperone was present thoroughout the entire procedure.  Anesthesia: none Diagnosis: fecal incontinence Findings: No hemorrhoid disease noted.  No masses   Assessment and Plan:  Diagnoses and all orders for this visit:  Incontinence of feces with fecal urgency    51 year old female who presents to the office for evaluation of fecal incontinence.  She is underwent temporary placement of neurostimulator and experienced greater than 50% improvement in her symptoms.  We have discussed permanent placement of sacral neurostimulator.  We have discussed the risk and benefits including bleeding, nonfunction, need for battery exchange and lead exchange.  All questions were answered.  Patient is agreeable to proceed.  No follow-ups on file.   Rosario Adie, MD Colon and Rectal Surgery [facility]  Surgery

## 2022-08-02 IMAGING — CR DG CHEST 2V
2 series · 2 of 2 positions shown · non-contrast
Comparison: 05/13/2020

CLINICAL DATA: Dyspnea

EXAM:
CHEST - 2 VIEW

[chest pa]
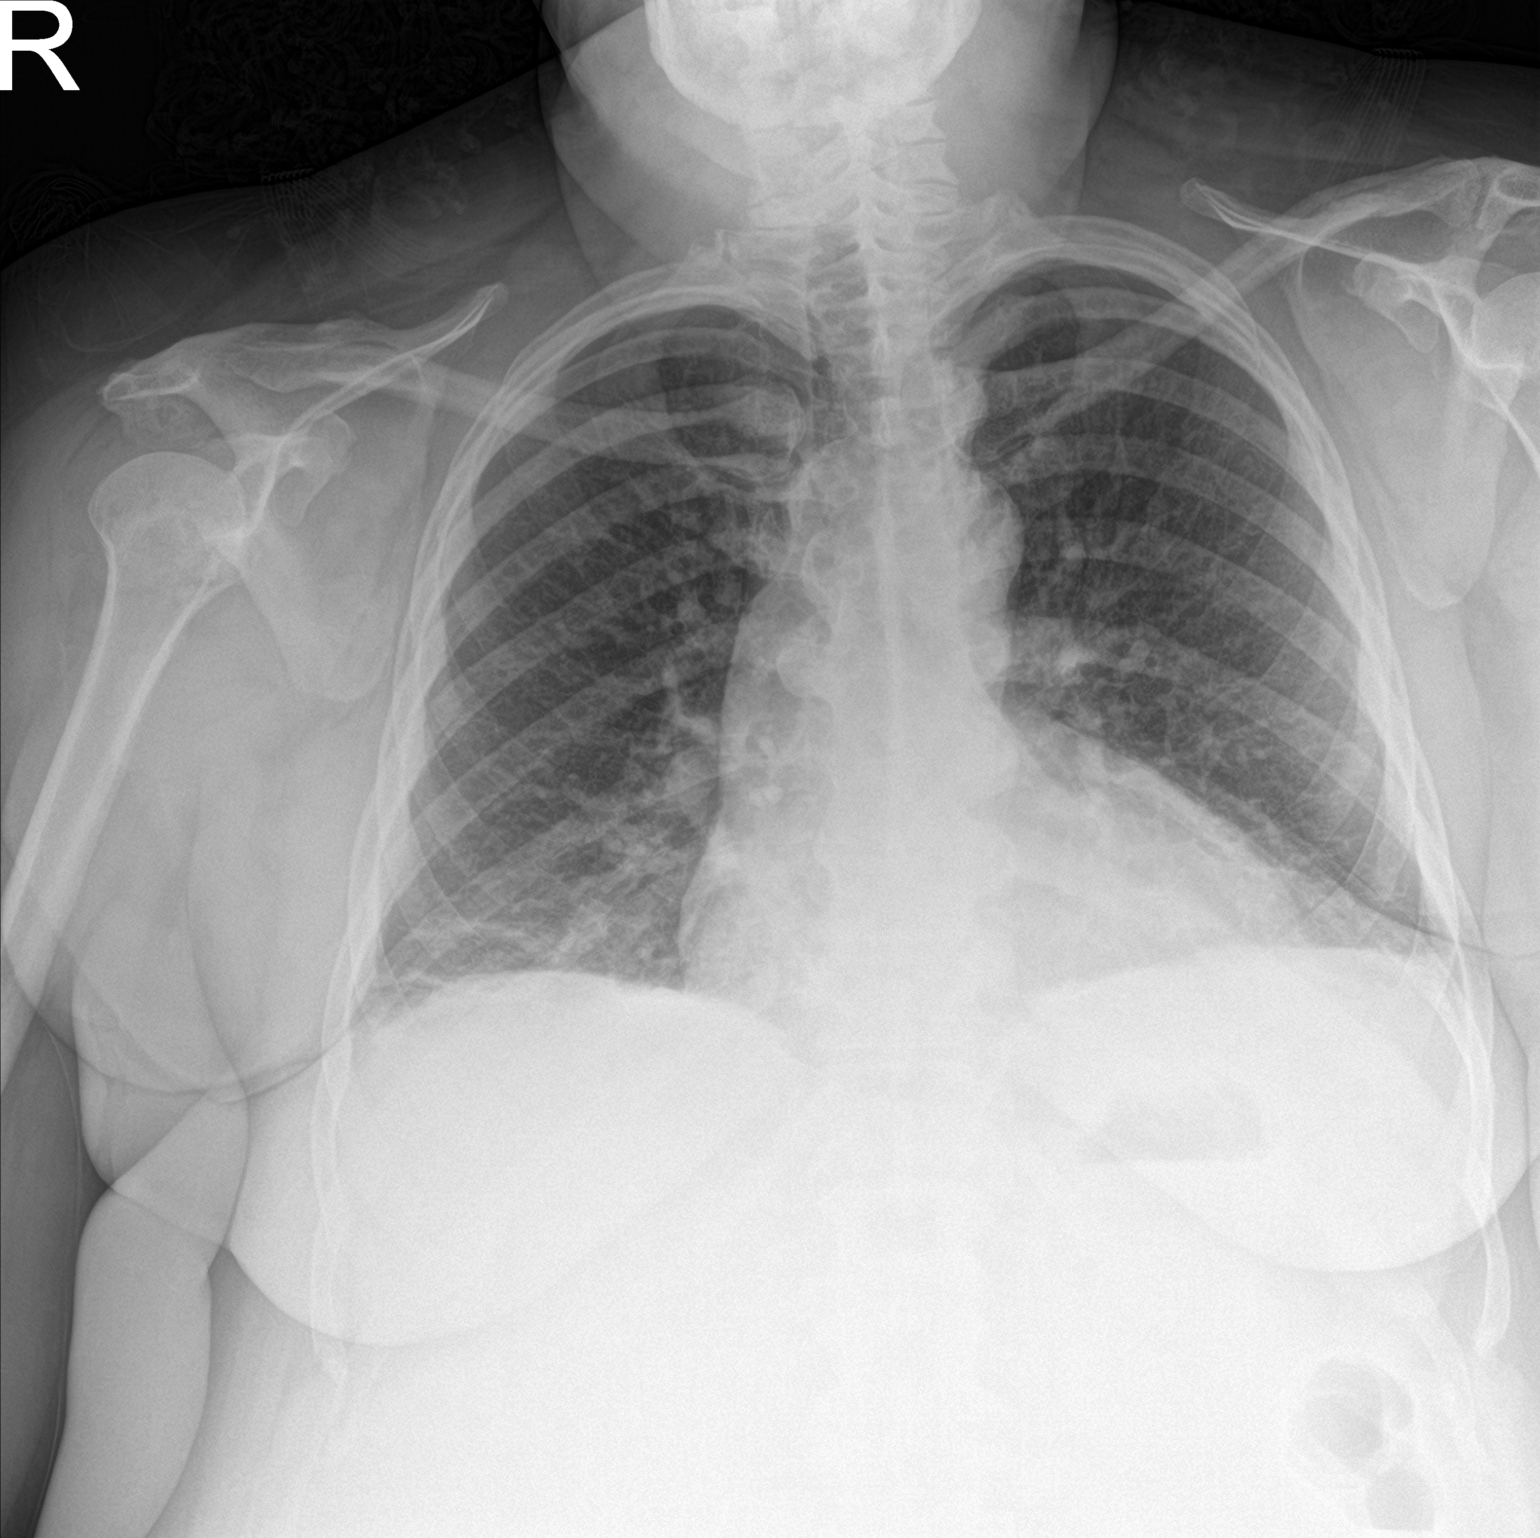

[chest lat]
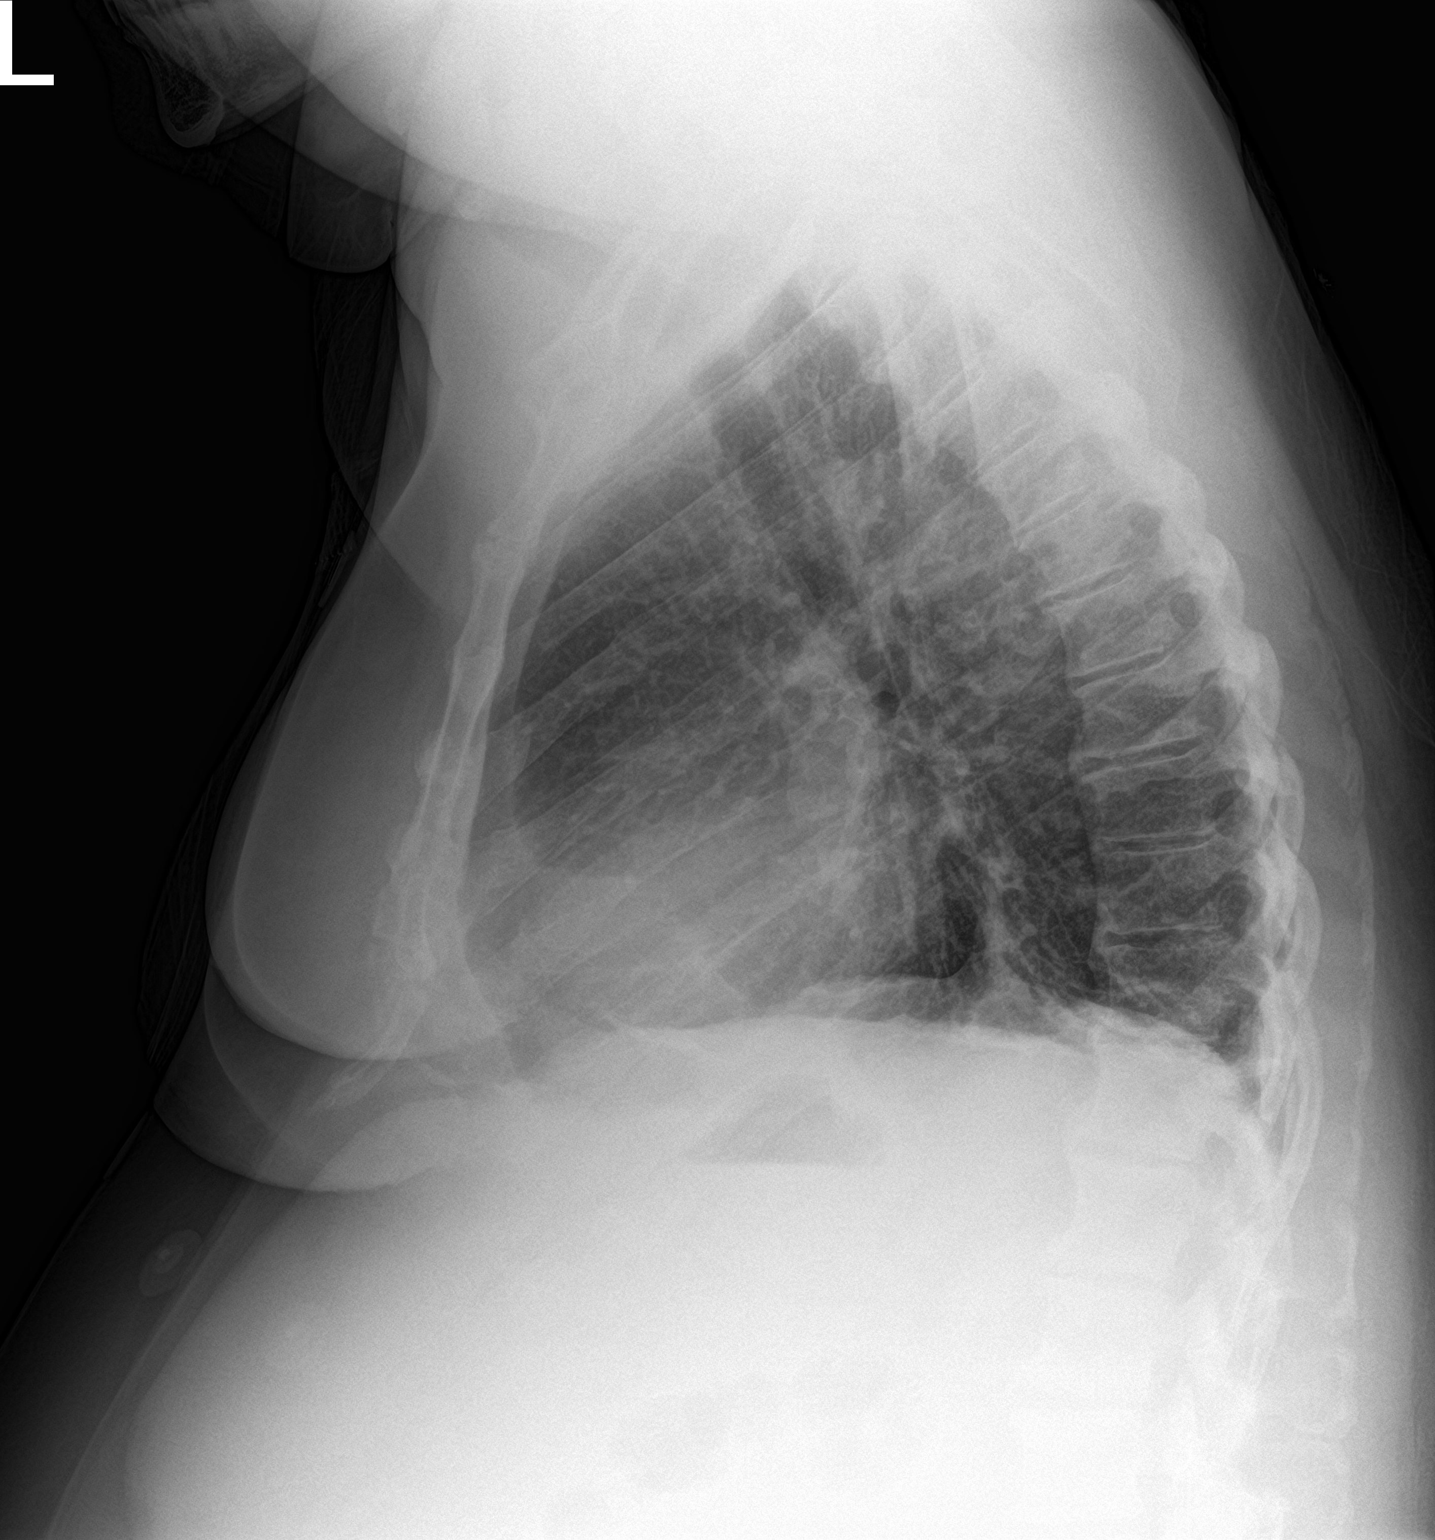

[2 of 2 positions shown; findings below may reference images not displayed]

FINDINGS: Mild right basilar atelectasis is present. Lungs are otherwise
clear. No pneumothorax or pleural effusion. Cardiac size within
normal limits. Pulmonary vascularity is normal. No acute bone
abnormality.
IMPRESSION: No active cardiopulmonary disease.

## 2022-08-03 ENCOUNTER — Other Ambulatory Visit (HOSPITAL_COMMUNITY): Payer: Self-pay

## 2022-08-03 MED ORDER — METHOCARBAMOL 500 MG PO TABS
500.0000 mg | ORAL_TABLET | Freq: Three times a day (TID) | ORAL | 1 refills | Status: DC | PRN
  Filled 2022-08-03: qty 90, 30d supply, fill #0

## 2022-08-03 MED ORDER — ERGOCALCIFEROL 1.25 MG (50000 UT) PO CAPS
50000.0000 [IU] | ORAL_CAPSULE | ORAL | 1 refills | Status: DC
  Filled 2022-08-03: qty 12, 84d supply, fill #0

## 2022-08-03 MED ORDER — OXYCODONE HCL 10 MG PO TABS
10.0000 mg | ORAL_TABLET | Freq: Every day | ORAL | 0 refills | Status: DC
  Filled 2022-08-03: qty 180, 30d supply, fill #0

## 2022-08-26 ENCOUNTER — Other Ambulatory Visit: Payer: Self-pay

## 2022-08-26 ENCOUNTER — Emergency Department (HOSPITAL_BASED_OUTPATIENT_CLINIC_OR_DEPARTMENT_OTHER): Payer: Medicaid Other

## 2022-08-26 ENCOUNTER — Inpatient Hospital Stay (HOSPITAL_COMMUNITY)
Admission: EM | Admit: 2022-08-26 | Discharge: 2022-08-29 | DRG: 191 | Disposition: A | Discharge status: Home / Self Care | Payer: Medicaid Other | Attending: Family Medicine | Admitting: Family Medicine

## 2022-08-26 ENCOUNTER — Emergency Department (HOSPITAL_COMMUNITY): Payer: Medicaid Other

## 2022-08-26 ENCOUNTER — Observation Stay (HOSPITAL_COMMUNITY): Payer: Medicaid Other

## 2022-08-26 DIAGNOSIS — M7989 Other specified soft tissue disorders: Secondary | ICD-10-CM

## 2022-08-26 DIAGNOSIS — M1990 Unspecified osteoarthritis, unspecified site: Secondary | ICD-10-CM | POA: Diagnosis present

## 2022-08-26 DIAGNOSIS — R0602 Shortness of breath: Secondary | ICD-10-CM

## 2022-08-26 DIAGNOSIS — F17210 Nicotine dependence, cigarettes, uncomplicated: Secondary | ICD-10-CM | POA: Diagnosis present

## 2022-08-26 DIAGNOSIS — R079 Chest pain, unspecified: Secondary | ICD-10-CM | POA: Diagnosis present

## 2022-08-26 DIAGNOSIS — J441 Chronic obstructive pulmonary disease with (acute) exacerbation: Principal | ICD-10-CM | POA: Diagnosis present

## 2022-08-26 DIAGNOSIS — I10 Essential (primary) hypertension: Secondary | ICD-10-CM | POA: Diagnosis present

## 2022-08-26 DIAGNOSIS — G4733 Obstructive sleep apnea (adult) (pediatric): Secondary | ICD-10-CM | POA: Diagnosis present

## 2022-08-26 DIAGNOSIS — Z7984 Long term (current) use of oral hypoglycemic drugs: Secondary | ICD-10-CM

## 2022-08-26 DIAGNOSIS — Z8249 Family history of ischemic heart disease and other diseases of the circulatory system: Secondary | ICD-10-CM

## 2022-08-26 DIAGNOSIS — Z6841 Body Mass Index (BMI) 40.0 and over, adult: Secondary | ICD-10-CM

## 2022-08-26 DIAGNOSIS — Z809 Family history of malignant neoplasm, unspecified: Secondary | ICD-10-CM

## 2022-08-26 DIAGNOSIS — R072 Precordial pain: Secondary | ICD-10-CM

## 2022-08-26 DIAGNOSIS — I890 Lymphedema, not elsewhere classified: Secondary | ICD-10-CM

## 2022-08-26 DIAGNOSIS — J449 Chronic obstructive pulmonary disease, unspecified: Secondary | ICD-10-CM | POA: Diagnosis present

## 2022-08-26 DIAGNOSIS — R52 Pain, unspecified: Secondary | ICD-10-CM | POA: Diagnosis not present

## 2022-08-26 DIAGNOSIS — M940 Chondrocostal junction syndrome [Tietze]: Secondary | ICD-10-CM | POA: Diagnosis present

## 2022-08-26 DIAGNOSIS — Z833 Family history of diabetes mellitus: Secondary | ICD-10-CM

## 2022-08-26 DIAGNOSIS — K219 Gastro-esophageal reflux disease without esophagitis: Secondary | ICD-10-CM | POA: Diagnosis present

## 2022-08-26 DIAGNOSIS — Z79899 Other long term (current) drug therapy: Secondary | ICD-10-CM

## 2022-08-26 DIAGNOSIS — E1140 Type 2 diabetes mellitus with diabetic neuropathy, unspecified: Secondary | ICD-10-CM | POA: Diagnosis present

## 2022-08-26 DIAGNOSIS — Z7951 Long term (current) use of inhaled steroids: Secondary | ICD-10-CM

## 2022-08-26 DIAGNOSIS — Z1152 Encounter for screening for COVID-19: Secondary | ICD-10-CM

## 2022-08-26 DIAGNOSIS — I5032 Chronic diastolic (congestive) heart failure: Secondary | ICD-10-CM | POA: Diagnosis present

## 2022-08-26 DIAGNOSIS — I110 Hypertensive heart disease with heart failure: Secondary | ICD-10-CM | POA: Diagnosis present

## 2022-08-26 DIAGNOSIS — F319 Bipolar disorder, unspecified: Secondary | ICD-10-CM | POA: Diagnosis present

## 2022-08-26 DIAGNOSIS — E785 Hyperlipidemia, unspecified: Secondary | ICD-10-CM | POA: Diagnosis present

## 2022-08-26 DIAGNOSIS — Z886 Allergy status to analgesic agent status: Secondary | ICD-10-CM

## 2022-08-26 DIAGNOSIS — F209 Schizophrenia, unspecified: Secondary | ICD-10-CM | POA: Diagnosis present

## 2022-08-26 LAB — CBC WITH DIFFERENTIAL/PLATELET
Abs Immature Granulocytes: 0.03 10*3/uL (ref 0.00–0.07)
Basophils Absolute: 0 10*3/uL (ref 0.0–0.1)
Basophils Relative: 0 %
Eosinophils Absolute: 0.1 10*3/uL (ref 0.0–0.5)
Eosinophils Relative: 1 %
HCT: 44.5 % (ref 36.0–46.0)
Hemoglobin: 14.5 g/dL (ref 12.0–15.0)
Immature Granulocytes: 0 %
Lymphocytes Relative: 25 %
Lymphs Abs: 2.3 10*3/uL (ref 0.7–4.0)
MCH: 31 pg (ref 26.0–34.0)
MCHC: 32.6 g/dL (ref 30.0–36.0)
MCV: 95.1 fL (ref 80.0–100.0)
Monocytes Absolute: 0.7 10*3/uL (ref 0.1–1.0)
Monocytes Relative: 8 %
Neutro Abs: 6 10*3/uL (ref 1.7–7.7)
Neutrophils Relative %: 66 %
Platelets: 211 10*3/uL (ref 150–400)
RBC: 4.68 MIL/uL (ref 3.87–5.11)
RDW: 13.4 % (ref 11.5–15.5)
WBC: 9.1 10*3/uL (ref 4.0–10.5)
nRBC: 0 % (ref 0.0–0.2)

## 2022-08-26 LAB — COMPREHENSIVE METABOLIC PANEL
ALT: 26 U/L (ref 0–44)
AST: 14 U/L — ABNORMAL LOW (ref 15–41)
Albumin: 3.8 g/dL (ref 3.5–5.0)
Alkaline Phosphatase: 88 U/L (ref 38–126)
Anion gap: 10 (ref 5–15)
BUN: 8 mg/dL (ref 6–20)
CO2: 26 mmol/L (ref 22–32)
Calcium: 9.7 mg/dL (ref 8.9–10.3)
Chloride: 103 mmol/L (ref 98–111)
Creatinine, Ser: 0.67 mg/dL (ref 0.44–1.00)
GFR, Estimated: >60 mL/min (ref >60)
Glucose, Bld: 108 mg/dL — ABNORMAL HIGH (ref 70–99)
Potassium: 4 mmol/L (ref 3.5–5.1)
Sodium: 139 mmol/L (ref 135–145)
Total Bilirubin: 0.7 mg/dL (ref 0.3–1.2)
Total Protein: 7.3 g/dL (ref 6.5–8.1)

## 2022-08-26 LAB — HIV ANTIBODY (ROUTINE TESTING W REFLEX): HIV Screen 4th Generation wRfx: NONREACTIVE

## 2022-08-26 LAB — RESP PANEL BY RT-PCR (RSV, FLU A&B, COVID)  RVPGX2
Influenza A by PCR: NEGATIVE
Influenza B by PCR: NEGATIVE
Resp Syncytial Virus by PCR: NEGATIVE
SARS Coronavirus 2 by RT PCR: NEGATIVE

## 2022-08-26 LAB — LIPASE, BLOOD: Lipase: 32 U/L (ref 11–51)

## 2022-08-26 LAB — TROPONIN I (HIGH SENSITIVITY)
Troponin I (High Sensitivity): 6 ng/L (ref <18)
Troponin I (High Sensitivity): 7 ng/L (ref <18)

## 2022-08-26 LAB — CBG MONITORING, ED: Glucose-Capillary: 204 mg/dL — ABNORMAL HIGH (ref 70–99)

## 2022-08-26 LAB — GLUCOSE, CAPILLARY: Glucose-Capillary: 177 mg/dL — ABNORMAL HIGH (ref 70–99)

## 2022-08-26 LAB — BRAIN NATRIURETIC PEPTIDE: B Natriuretic Peptide: 11.6 pg/mL (ref 0.0–100.0)

## 2022-08-26 MED ORDER — DOXYCYCLINE HYCLATE 100 MG PO TABS
100.0000 mg | ORAL_TABLET | Freq: Two times a day (BID) | ORAL | Status: DC
  Administered 2022-08-26 – 2022-08-29 (×6): 100 mg via ORAL
  Filled 2022-08-26 (×6): qty 1

## 2022-08-26 MED ORDER — METHOCARBAMOL 500 MG PO TABS
500.0000 mg | ORAL_TABLET | Freq: Three times a day (TID) | ORAL | Status: DC | PRN
  Administered 2022-08-27 – 2022-08-28 (×2): 500 mg via ORAL
  Filled 2022-08-26 (×2): qty 1

## 2022-08-26 MED ORDER — FESOTERODINE FUMARATE ER 4 MG PO TB24
4.0000 mg | ORAL_TABLET | Freq: Every day | ORAL | Status: DC
  Administered 2022-08-26 – 2022-08-29 (×4): 4 mg via ORAL
  Filled 2022-08-26 (×4): qty 1

## 2022-08-26 MED ORDER — PREDNISONE 20 MG PO TABS
40.0000 mg | ORAL_TABLET | Freq: Every day | ORAL | Status: DC
  Administered 2022-08-27 – 2022-08-29 (×3): 40 mg via ORAL
  Filled 2022-08-26 (×3): qty 2

## 2022-08-26 MED ORDER — NICOTINE 21 MG/24HR TD PT24
21.0000 mg | MEDICATED_PATCH | Freq: Every day | TRANSDERMAL | Status: DC
  Administered 2022-08-26 – 2022-08-29 (×4): 21 mg via TRANSDERMAL
  Filled 2022-08-26 (×4): qty 1

## 2022-08-26 MED ORDER — UMECLIDINIUM BROMIDE 62.5 MCG/ACT IN AEPB
1.0000 | INHALATION_SPRAY | Freq: Every day | RESPIRATORY_TRACT | Status: DC
  Administered 2022-08-27 – 2022-08-29 (×3): 1 via RESPIRATORY_TRACT
  Filled 2022-08-26: qty 7

## 2022-08-26 MED ORDER — IPRATROPIUM-ALBUTEROL 0.5-2.5 (3) MG/3ML IN SOLN
3.0000 mL | Freq: Once | RESPIRATORY_TRACT | Status: AC
  Administered 2022-08-26: 3 mL via RESPIRATORY_TRACT
  Filled 2022-08-26: qty 3

## 2022-08-26 MED ORDER — ENOXAPARIN SODIUM 60 MG/0.6ML IJ SOSY
60.0000 mg | PREFILLED_SYRINGE | INTRAMUSCULAR | Status: DC
  Administered 2022-08-26 – 2022-08-28 (×3): 60 mg via SUBCUTANEOUS
  Filled 2022-08-26 (×3): qty 0.6

## 2022-08-26 MED ORDER — DULOXETINE HCL 20 MG PO CPEP
20.0000 mg | ORAL_CAPSULE | Freq: Two times a day (BID) | ORAL | Status: DC
  Administered 2022-08-26 – 2022-08-29 (×6): 20 mg via ORAL
  Filled 2022-08-26 (×7): qty 1

## 2022-08-26 MED ORDER — ALBUTEROL SULFATE (2.5 MG/3ML) 0.083% IN NEBU: 2.5000 mg | INHALATION_SOLUTION | Freq: Four times a day (QID) | RESPIRATORY_TRACT | Status: DC | PRN

## 2022-08-26 MED ORDER — URSODIOL 300 MG PO CAPS
300.0000 mg | ORAL_CAPSULE | Freq: Two times a day (BID) | ORAL | Status: DC
  Administered 2022-08-26 – 2022-08-29 (×6): 300 mg via ORAL
  Filled 2022-08-26 (×7): qty 1

## 2022-08-26 MED ORDER — INSULIN ASPART 100 UNIT/ML IJ SOLN: 0.0000 [IU] | Freq: Every day | INTRAMUSCULAR | Status: DC

## 2022-08-26 MED ORDER — OXYCODONE HCL 5 MG PO TABS
10.0000 mg | ORAL_TABLET | Freq: Every day | ORAL | Status: DC
  Administered 2022-08-26 – 2022-08-29 (×14): 10 mg via ORAL
  Filled 2022-08-26 (×14): qty 2

## 2022-08-26 MED ORDER — ALBUTEROL SULFATE (2.5 MG/3ML) 0.083% IN NEBU
2.5000 mg | INHALATION_SOLUTION | Freq: Four times a day (QID) | RESPIRATORY_TRACT | Status: DC
  Administered 2022-08-26 – 2022-08-27 (×3): 2.5 mg via RESPIRATORY_TRACT
  Filled 2022-08-26 (×3): qty 3

## 2022-08-26 MED ORDER — IOHEXOL 350 MG/ML SOLN
75.0000 mL | Freq: Once | INTRAVENOUS | Status: AC | PRN
  Administered 2022-08-26: 75 mL via INTRAVENOUS

## 2022-08-26 MED ORDER — POTASSIUM CHLORIDE CRYS ER 10 MEQ PO TBCR
10.0000 meq | EXTENDED_RELEASE_TABLET | Freq: Two times a day (BID) | ORAL | Status: DC
  Administered 2022-08-26 – 2022-08-29 (×6): 10 meq via ORAL
  Filled 2022-08-26 (×7): qty 1

## 2022-08-26 MED ORDER — ATORVASTATIN CALCIUM 40 MG PO TABS
40.0000 mg | ORAL_TABLET | Freq: Every day | ORAL | Status: DC
  Administered 2022-08-26 – 2022-08-29 (×4): 40 mg via ORAL
  Filled 2022-08-26 (×4): qty 1

## 2022-08-26 MED ORDER — INSULIN ASPART 100 UNIT/ML IJ SOLN
0.0000 [IU] | Freq: Three times a day (TID) | INTRAMUSCULAR | Status: DC
  Administered 2022-08-27 – 2022-08-28 (×4): 4 [IU] via SUBCUTANEOUS
  Administered 2022-08-28 (×2): 7 [IU] via SUBCUTANEOUS
  Administered 2022-08-29: 3 [IU] via SUBCUTANEOUS

## 2022-08-26 MED ORDER — GABAPENTIN 300 MG PO CAPS
300.0000 mg | ORAL_CAPSULE | Freq: Three times a day (TID) | ORAL | Status: DC
  Administered 2022-08-26 – 2022-08-29 (×9): 300 mg via ORAL
  Filled 2022-08-26 (×9): qty 1

## 2022-08-26 MED ORDER — TRIAMCINOLONE ACETONIDE 0.1 % EX CREA
1.0000 | TOPICAL_CREAM | Freq: Two times a day (BID) | CUTANEOUS | Status: DC
  Administered 2022-08-27 – 2022-08-29 (×5): 1 via TOPICAL
  Filled 2022-08-26: qty 15

## 2022-08-26 MED ORDER — MORPHINE SULFATE (PF) 4 MG/ML IV SOLN
4.0000 mg | Freq: Once | INTRAVENOUS | Status: AC
  Administered 2022-08-26: 4 mg via INTRAVENOUS
  Filled 2022-08-26: qty 1

## 2022-08-26 MED ORDER — BUDESON-GLYCOPYRROL-FORMOTEROL 160-9-4.8 MCG/ACT IN AERO: 2.0000 | INHALATION_SPRAY | Freq: Two times a day (BID) | RESPIRATORY_TRACT | Status: DC

## 2022-08-26 MED ORDER — METHYLPREDNISOLONE SODIUM SUCC 125 MG IJ SOLR
125.0000 mg | Freq: Every day | INTRAMUSCULAR | Status: DC
  Administered 2022-08-26: 125 mg via INTRAVENOUS
  Filled 2022-08-26: qty 2

## 2022-08-26 MED ORDER — TRIAMTERENE-HCTZ 37.5-25 MG PO TABS
1.0000 | ORAL_TABLET | Freq: Every day | ORAL | Status: DC
  Administered 2022-08-26 – 2022-08-29 (×4): 1 via ORAL
  Filled 2022-08-26 (×5): qty 1

## 2022-08-26 MED ORDER — ARIPIPRAZOLE 5 MG PO TABS
20.0000 mg | ORAL_TABLET | Freq: Every day | ORAL | Status: DC
  Administered 2022-08-26 – 2022-08-29 (×4): 20 mg via ORAL
  Filled 2022-08-26 (×4): qty 4

## 2022-08-26 MED ORDER — IRBESARTAN 75 MG PO TABS
75.0000 mg | ORAL_TABLET | Freq: Every day | ORAL | Status: DC
  Administered 2022-08-26 – 2022-08-29 (×4): 75 mg via ORAL
  Filled 2022-08-26 (×4): qty 1

## 2022-08-26 MED ORDER — METHYLPREDNISOLONE SODIUM SUCC 40 MG IJ SOLR
40.0000 mg | Freq: Two times a day (BID) | INTRAMUSCULAR | Status: AC
  Administered 2022-08-26 – 2022-08-27 (×2): 40 mg via INTRAVENOUS
  Filled 2022-08-26 (×2): qty 1

## 2022-08-26 MED ORDER — FLUTICASONE FUROATE-VILANTEROL 200-25 MCG/ACT IN AEPB
1.0000 | INHALATION_SPRAY | Freq: Every day | RESPIRATORY_TRACT | Status: DC
  Administered 2022-08-27 – 2022-08-29 (×3): 1 via RESPIRATORY_TRACT
  Filled 2022-08-26: qty 28

## 2022-08-26 MED ORDER — METOPROLOL SUCCINATE ER 50 MG PO TB24
50.0000 mg | ORAL_TABLET | Freq: Every day | ORAL | Status: DC
  Administered 2022-08-26 – 2022-08-29 (×4): 50 mg via ORAL
  Filled 2022-08-26 (×4): qty 1

## 2022-08-26 NOTE — ED Notes (Signed)
Pt to CT

## 2022-08-26 NOTE — Progress Notes (Signed)
Repeat CTA negative for PE.

## 2022-08-26 NOTE — ED Triage Notes (Signed)
Pt arrivies via EMS, Week ago with cold like symptoms, increase SOB, DOEm chest, cough up thick yellow muscus, Hx of COD, CHF, 98% on 2L [place]. 174/90, HR 80, 24 RR, CBG 118

## 2022-08-26 NOTE — ED Notes (Signed)
Pt to US.

## 2022-08-26 NOTE — H&P (Signed)
History and Physical    Lori Bean HYQ:[id number] DOB: [birth year] DOA: 08/26/2022  PCP: [name], PA (Confirm with patient/family/NH records and if not entered, this has to be entered at [facility] point of entry) Patient coming from: Home  I have personally briefly reviewed patient's old medical records in [place]  Chief Complaint: Cough, wheezing, SOB  HPI: Lori Bean is a 51 y.o. female with medical history significant of COPD Gold stage II, HTN, HLD, bipolar disorder, morbid obesity, chronic lymphedema, presented with worsening of cough wheezing shortness of breath and chest pain.  Patient had a earlier COPD flareup about 2 weeks ago, when she developed cough wheezing shortness of breath, and for which she was treated with p.o. antibiotics for 10 days which she completed 3 days ago along with breathing treatment.  Her symptom is significantly improved however about 3 to 4 days ago, she became wheezy and cough again, and phlegm looked darker yellow thick, no fever.  She also has had right-sided pleuritic chest pain worsening with cough and deep breath, located on the right mid chest nonradiating, sharp-like.  ED Course: Patient was found tachypneic no significant tachycardia or hypoxia afebrile.  CT angiogram showed questionable right lower lobe PE, but overall image quality poor and radiologist recommended repeat imaging in short period.  Troponins negative x 2.  Patient was given breathing treatment and morphine for chest pain in the ED.  DVT study was negative.  Review of Systems: As per HPI otherwise 14 point review of systems negative.    Past Medical History:  Diagnosis Date   Abdominal hernia    Anginal pain ([place])    occ from asthma   Anxiety    Arthritis    Asthma    Bipolar affective disorder ([place])    Schizophrenia   Bronchitis    hx of   CHF (congestive heart failure) ([place])    Depression    Diabetes mellitus without complication ([place])     " New Onset " per patient   GERD (gastroesophageal reflux disease)    Heart murmur    Hypertension    takes meds   Lymphedema 06/07/2018   Morbid (severe) obesity due to excess calories (HCC)    Neuropathy    left leg , "from back surgery"   Overactive bladder    Schizophrenia ([place])    Sciatica    Shortness of breath    Sleep apnea    Snoring disorder    Pt stated my boyfriend always wakes me up and tells me to breathe    Past Surgical History:  Procedure Laterality Date   BACK SURGERY     3 back surgeries   CHOLECYSTECTOMY     COLONOSCOPY WITH PROPOFOL N/A 01/23/2016   Procedure: COLONOSCOPY WITH PROPOFOL;  Surgeon: Wonda Horner, MD;  Location: [facility];  Service: Endoscopy;  Laterality: N/A;   EYE SURGERY     Metal plate in right eye. Had fracture in right eye   gallstones reomved     KNEE ARTHROSCOPY WITH MENISCAL REPAIR Right 09/28/2016   Procedure: KNEE ARTHROSCOPY WITH MENISCAL REPAIR;  Surgeon: Dorna Leitz, MD;  Location: [facility];  Service: Orthopedics;  Laterality: Right;  Right partial meniscectomy and chondroplasty, patellar/femoral joint and medial femoral condyle    LUMBAR LAMINECTOMY/DECOMPRESSION MICRODISCECTOMY  04/01/2012   Procedure: LUMBAR LAMINECTOMY/DECOMPRESSION MICRODISCECTOMY 2 LEVELS;  Surgeon: Faythe Ghee, MD;  Location: [facility];  Service: Neurosurgery;  Laterality: Left;  Lumbar four-five,  lumbar five sacral one microdiscectomy    LUMBAR WOUND DEBRIDEMENT  04/29/2012   Procedure: LUMBAR WOUND DEBRIDEMENT;  Surgeon: Faythe Ghee, MD;  Location: [facility];  Service: Neurosurgery;  Laterality: N/A;  lumbar wound debridement   ROTATOR CUFF REPAIR     Right shoulder   TUBAL LIGATION       reports that she has been smoking cigarettes. She has a 24.00 pack-year smoking history. She has been exposed to tobacco smoke. She has never used smokeless tobacco. She reports that she does not currently use drugs after having used the following drugs:  Cocaine. She reports that she does not drink alcohol.  Allergies  Allergen Reactions   Aspirin Nausea And Vomiting    Family History  Problem Relation Age of Onset   Diabetes Mother    Hypertension Mother    Diabetes Father    Heart disease Paternal Aunt    Cancer Paternal Aunt    Esophageal cancer Neg Hx    Stomach cancer Neg Hx      Prior to Admission medications   Medication Sig Start Date End Date Taking? Authorizing Provider  albuterol (PROVENTIL) (2.5 MG/3ML) 0.083% nebulizer solution Take 2.5 mg by nebulization every 6 (six) hours as needed for wheezing or shortness of breath. 08/12/22  Yes [provider]  albuterol (VENTOLIN HFA) 108 (90 Base) MCG/ACT inhaler Inhale 1-2 puffs into the lungs every 6 (six) hours as needed for wheezing or shortness of breath. 08/22/21  Yes Olalere, Adewale A, MD  ARIPiprazole (ABILIFY) 20 MG tablet Take 20 mg by mouth daily. 05/20/21  Yes [provider]  atorvastatin (LIPITOR) 40 MG tablet Take 1 tablet (40 mg total) by mouth daily. 06/26/22  Yes Loel Dubonnet, NP  Budeson-Glycopyrrol-Formoterol (BREZTRI AEROSPHERE) 160-9-4.8 MCG/ACT AERO Inhale 2 puffs into the lungs in the morning and at bedtime. 08/22/21  Yes Olalere, Adewale A, MD  DULoxetine (CYMBALTA) 20 MG capsule Take 20 mg by mouth 2 (two) times daily.   Yes [provider]  ergocalciferol (VITAMIN D2) 1.25 MG (50000 UT) capsule Take 1 capsule (50,000 Units total) by mouth once a week. Patient taking differently: Take 50,000 Units by mouth once a week. Monday 08/03/22  Yes McCoy, Apolonio Schneiders, NP  gabapentin (NEURONTIN) 300 MG capsule Take 300 mg by mouth 3 (three) times daily.  02/02/19  Yes [provider]  glipiZIDE (GLUCOTROL XL) 10 MG 24 hr tablet Take 10 mg by mouth daily with breakfast.    Yes [provider]  metFORMIN (GLUCOPHAGE) 1000 MG tablet Take 1,000 mg by mouth 2 (two) times daily with a meal.    Yes [provider]   methocarbamol (ROBAXIN) 500 MG tablet Take 1 tablet (500 mg total) by mouth 3 (three) times daily as needed for muscle spasms. 08/03/22  Yes   metoprolol succinate (TOPROL-XL) 50 MG 24 hr tablet Take 50 mg by mouth daily. 05/22/22  Yes [provider]  Oxycodone HCl 10 MG TABS Take 1 tablet (10 mg total) by mouth 6 (six) times daily as needed for pain. 08/03/22  Yes   OZEMPIC, 0.25 OR 0.5 MG/DOSE, 2 MG/3ML SOPN Inject 0.5 mg into the skin once a week. Monday 08/20/22  Yes [provider]  potassium chloride (KLOR-CON) 10 MEQ tablet Take 10 mEq by mouth 2 (two) times daily. 11/25/21  Yes [provider]  tolterodine (DETROL LA) 4 MG 24 hr capsule Take 4 mg by mouth daily.   Yes [provider]  Triamcinolone Acetonide 0.025 % LOTN Apply 1 Application topically 2 (two) times daily.   Yes [provider]  triamterene-hydrochlorothiazide (MAXZIDE-25) 37.5-25 MG tablet Take 1 tablet by mouth daily. 06/26/22  Yes Loel Dubonnet, NP  ursodiol (ACTIGALL) 300 MG capsule Take 300 mg by mouth 2 (two) times daily. 06/17/22  Yes [provider]  valsartan (DIOVAN) 80 MG tablet TAKE 1 TABLET(80 MG) BY MOUTH DAILY Patient taking differently: Take 80 mg by mouth daily. 06/26/22  Yes Loel Dubonnet, NP  cephALEXin (KEFLEX) 500 MG capsule Take 500 mg by mouth 4 (four) times daily. 10 DS 08/13/22   [provider]  methocarbamol (ROBAXIN) 500 MG tablet Take 1 tablet (500 mg total) by mouth 3 (three) times daily as needed for muscle spasms Patient not taking: Reported on 08/26/2022 05/08/22     naloxone (NARCAN) nasal spray 4 mg/0.1 mL 1 spray every 2 minutes as needed for opioid overdose; spray 1 dose into ONE nostril; alternate nostrils w each dose until help arrives 05/08/22       Physical Exam: Vitals:   08/26/22 1013 08/26/22 1100 08/26/22 1300 08/26/22 1347  BP: (!) 167/96   (!) 151/81  Pulse: 78 71  84  Resp:  '14 19 19  '$ Temp:    97.7 F (36.5  C)  TempSrc:    Oral  SpO2: 99% 98%  99%  Weight:      Height:        Constitutional: NAD, calm, comfortable Vitals:   08/26/22 1013 08/26/22 1100 08/26/22 1300 08/26/22 1347  BP: (!) 167/96   (!) 151/81  Pulse: 78 71  84  Resp:  '14 19 19  '$ Temp:    97.7 F (36.5 C)  TempSrc:    Oral  SpO2: 99% 98%  99%  Weight:      Height:       Eyes: PERRL, lids and conjunctivae normal ENMT: Mucous membranes are moist. Posterior pharynx clear of any exudate or lesions.Normal dentition.  Neck: normal, supple, no masses, no thyromegaly Respiratory: Diminished breathing sound bilaterally, scattered crackles bilaterally, diffused wheezing, increasing breathing effort. No accessory muscle use.  Cardiovascular: Regular rate and rhythm, no murmurs / rubs / gallops.  Chronic lymphedema. 2+ pedal pulses. No carotid bruits.  Abdomen: no tenderness, no masses palpated. No hepatosplenomegaly. Bowel sounds positive.  Musculoskeletal: no clubbing / cyanosis. No joint deformity upper and lower extremities. Good ROM, no contractures. Normal muscle tone.  Skin: no rashes, lesions, ulcers. No induration Neurologic: CN 2-12 grossly intact. Sensation intact, DTR normal. Strength 5/5 in all 4.  Psychiatric: Normal judgment and insight. Alert and oriented x 3. Normal mood.     Labs on Admission: I have personally reviewed following labs and imaging studies  CBC: Recent Labs  Lab 08/26/22 1042  WBC 9.1  NEUTROABS 6.0  HGB 14.5  HCT 44.5  MCV 95.1  PLT 144   Basic Metabolic Panel: Recent Labs  Lab 08/26/22 1042  NA 139  K 4.0  CL 103  CO2 26  GLUCOSE 108*  BUN 8  CREATININE 0.67  CALCIUM 9.7   GFR: Estimated Creatinine Clearance: 102.7 mL/min (by C-G formula based on SCr of 0.67 mg/dL). Liver Function Tests: Recent Labs  Lab 08/26/22 1042  AST 14*  ALT 26  ALKPHOS 88  BILITOT 0.7  PROT 7.3  ALBUMIN 3.8   Recent Labs  Lab 08/26/22 1042  LIPASE 32   No results for input(s):  "AMMONIA" in the last 168  hours. Coagulation Profile: No results for input(s): "INR", "PROTIME" in the last 168 hours. Cardiac Enzymes: No results for input(s): "CKTOTAL", "CKMB", "CKMBINDEX", "TROPONINI" in the last 168 hours. BNP (last 3 results) No results for input(s): "PROBNP" in the last 8760 hours. HbA1C: No results for input(s): "HGBA1C" in the last 72 hours. CBG: No results for input(s): "GLUCAP" in the last 168 hours. Lipid Profile: No results for input(s): "CHOL", "HDL", "LDLCALC", "TRIG", "CHOLHDL", "LDLDIRECT" in the last 72 hours. Thyroid Function Tests: No results for input(s): "TSH", "T4TOTAL", "FREET4", "T3FREE", "THYROIDAB" in the last 72 hours. Anemia Panel: No results for input(s): "VITAMINB12", "FOLATE", "FERRITIN", "TIBC", "IRON", "RETICCTPCT" in the last 72 hours. Urine analysis:    Component Value Date/Time   COLORURINE YELLOW 03/28/2021 1807   APPEARANCEUR CLOUDY (A) 03/28/2021 1807   LABSPEC 1.032 (H) 03/28/2021 1807   PHURINE 5.0 03/28/2021 1807   GLUCOSEU NEGATIVE 03/28/2021 1807   HGBUR MODERATE (A) 03/28/2021 1807   BILIRUBINUR NEGATIVE 03/28/2021 1807   BILIRUBINUR neg 06/19/2015 1142   KETONESUR NEGATIVE 03/28/2021 1807   PROTEINUR 100 (A) 03/28/2021 1807   UROBILINOGEN 0.2 06/19/2015 1142   UROBILINOGEN 0.2 08/24/2013 1416   NITRITE NEGATIVE 03/28/2021 1807   LEUKOCYTESUR NEGATIVE 03/28/2021 1807    Radiological Exams on Admission: CT Angio Chest PE W and/or Wo Contrast  Result Date: 08/26/2022 CLINICAL DATA:  Pulmonary embolism (PE) suspected, high prob EXAM: CT ANGIOGRAPHY CHEST WITH CONTRAST TECHNIQUE: Multidetector CT imaging of the chest was performed using the standard protocol during bolus administration of intravenous contrast. Multiplanar CT image reconstructions and MIPs were obtained to evaluate the vascular anatomy. RADIATION DOSE REDUCTION: This exam was performed according to the departmental dose-optimization program which  includes automated exposure control, adjustment of the mA and/or kV according to patient size and/or use of iterative reconstruction technique. CONTRAST:  54m OMNIPAQUE IOHEXOL 350 MG/ML SOLN COMPARISON:  CT a chest 04/02/2021. FINDINGS: Cardiovascular: Suboptimal opacification of the pulmonary arteries, essentially nondiagnostic. Within this limitation, there is a questionable filling defect in a right lower lobe segmental branch (series 5, image 82). Mild cardiomegaly.No pericardial disease. Thoracic aorta is unremarkable. Mediastinum/Nodes: No lymphadenopathy. The thyroid is unremarkable. Lungs/Pleura: There is a 4 mm right upper lobe pulmonary nodule (series 6, image 48), stable since December 2020 and benign. No new suspicious pulmonary nodules. Bibasilar subsegmental atelectasis. No pleural effusion or pneumothorax. Mild lower lung predominant bronchial wall thickening. Lingular subsegmental atelectasis. No focal airspace consolidation. Upper Abdomen: Left adrenal thickening without discrete nodule. This is unchanged from prior exam. Musculoskeletal: No acute osseous abnormality. Multilevel degenerative changes spine. No suspicious osseous lesion. Review of the MIP images confirms the above findings. IMPRESSION: Suboptimal opacification of pulmonary arteries, essentially nondiagnostic. Within this limitation, there is a questionable filling defect in a right lower lobe segmental branch which could represent an embolus or artifact. Consider short-term repeat CTA of the chest or lower extremity Doppler ultrasound. Lower lung predominant bronchial wall thickening, suggesting bronchitis. Bibasilar subsegmental atelectasis.  No airspace consolidation. Electronically Signed   By: JMaurine SimmeringM.D.   On: 08/26/2022 13:51   DG Chest Portable 1 View  Result Date: 08/26/2022 CLINICAL DATA:  Shortness of breath, cough EXAM: PORTABLE CHEST 1 VIEW COMPARISON:  Portable exam 1047 hours compared to 04/23/2021 FINDINGS:  Borderline enlargement of cardiac silhouette. Mediastinal contours and pulmonary vascularity normal. Minimal bibasilar atelectasis. Lungs otherwise clear. No pulmonary infiltrate, pleural effusion, or pneumothorax. Osseous structures unremarkable. IMPRESSION: Minimal bibasilar atelectasis. Electronically Signed   By: MElta Guadeloupe  Thornton Papas M.D.   On: 08/26/2022 11:26   VAS [place] LOWER EXTREMITY VENOUS (DVT) (7a-7p)  Result Date: 08/26/2022  Lower Venous DVT Study Patient Name:  JEFFERY GAMMELL  Date of Exam:   08/26/2022 Medical Rec #: [id number]             Accession #:    [id number] Date of Birth: 1971/07/24             Patient Gender: F Patient Age:   89 years Exam Location:  [facility] Procedure:      VAS [place] LOWER EXTREMITY VENOUS (DVT) Referring Phys: JOSHUA LONG --------------------------------------------------------------------------------  Indications: Swelling, Pain, and SOB.  Limitations: Body habitus and poor ultrasound/tissue interface. Comparison Study: 04/02/22 - Negative reflux study                   06-30-21 - Negative LEV Performing Technologist: Velva Harman Sturdivant RDMS, RVT  Examination Guidelines: A complete evaluation includes B-mode imaging, spectral Doppler, color Doppler, and power Doppler as needed of all accessible portions of each vessel. Bilateral testing is considered an integral part of a complete examination. Limited examinations for reoccurring indications may be performed as noted. The reflux portion of the exam is performed with the patient in reverse Trendelenburg.  +-----+---------------+---------+-----------+----------+--------------+ RIGHTCompressibilityPhasicitySpontaneityPropertiesThrombus Aging +-----+---------------+---------+-----------+----------+--------------+ CFV  Full           Yes      Yes                                 +-----+---------------+---------+-----------+----------+--------------+ SFJ  Full                                                         +-----+---------------+---------+-----------+----------+--------------+   +---------+---------------+---------+-----------+----------+-------------------+ LEFT     CompressibilityPhasicitySpontaneityPropertiesThrombus Aging      +---------+---------------+---------+-----------+----------+-------------------+ CFV      Full           Yes      Yes                                      +---------+---------------+---------+-----------+----------+-------------------+ SFJ      Full                                                             +---------+---------------+---------+-----------+----------+-------------------+ FV Prox  Full                                                             +---------+---------------+---------+-----------+----------+-------------------+ FV Mid   Full                                                             +---------+---------------+---------+-----------+----------+-------------------+  FV DistalFull                                                             +---------+---------------+---------+-----------+----------+-------------------+ PFV      Full                                                             +---------+---------------+---------+-----------+----------+-------------------+ POP      Full           Yes      Yes                                      +---------+---------------+---------+-----------+----------+-------------------+ PTV      Full                                                             +---------+---------------+---------+-----------+----------+-------------------+ PERO     Full                                         Not well                                                                  visualized, appears                                                       patent              +---------+---------------+---------+-----------+----------+-------------------+    Summary: RIGHT: - No evidence of common femoral vein obstruction.  LEFT: - There is no evidence of deep vein thrombosis in the lower extremity.  *See table(s) above for measurements and observations.    Preliminary     EKG: Independently reviewed.  Sinus, no acute ST changes.  Assessment/Plan Principal Problem:   COPD (chronic obstructive pulmonary disease) (HCC) Active Problems:   Essential hypertension   Chest pain   OSA (obstructive sleep apnea)   Lymphedema   COPD mixed type (HCC)  (please populate well all problems here in Problem List. (For example, if patient is on BP meds at home and you resume or decide to hold them, it is a problem that needs to be her. Same for CAD, COPD, HLD and so on)  Chest pain -Question of PE.  CTA is inconclusive for right lower lobe PE.  Review patient's chart showed  that patient had a coronary artery CT done July this year, which also commented questionable right lower lobe PE but repeat dedicated PE study Was negative.  Given there is a  new onset of chest pain this time, appears to be correlated to the right side of the chest, will repeat CT angiogram in 24 hours. -Other DDx, coronary artery calcium score= 0 in July 2023, low suspicion for ACS, and troponin negative x 2.  Acute COPD exacerbation -Failed outpatient treatment -Trial of IV steroid bridging for p.o. steroid for short time, continue DuoNebs, as needed albuterol and LABA and inhaled steroid.  Given there is a significant change of sputum production and color, will add short course of doxycycline, check sputum culture -Incentive spirometry and flutter valve -Smoking cessation consultation performed at bedside  IIDM -Hold off metformin for 72 hours as patient received IV contrast -Sliding scale for now  HTN -Continue current regimen ARB, metoprolol and HCTZ  Diabetic neuropathy -Continue gabapentin  Bipolar disorder -Continue Abilify and Cymbalta  Morbid obesity -BMI= 52,  recommend outpatient bariatric evaluation.  Smoking cigarette -Cessation education performed bedside, nicotine patch for now  DVT prophylaxis: Lovenox Code Status: Full code Family Communication: None at bedside Disposition Plan: Expect less than 2 midnight hospital stay Consults called: None Admission status: MedSurg observation   Lequita Halt MD [facility] Pager [phone]  08/26/2022, 3:06 PM

## 2022-08-26 NOTE — Progress Notes (Signed)
Left lower ext venous  has been completed. Refer to [facility] under chart review to view preliminary results.   08/26/2022  11:10 AM Lori Bean, Bonnye Fava

## 2022-08-26 NOTE — ED Provider Notes (Signed)
Emergency Department Provider Note   I have reviewed the triage vital signs and the nursing notes.   HISTORY  Chief Complaint Shortness of Breath and Cough (Starting 2 week ago. )   HPI Lori Bean is a 51 y.o. female past medical history of diabetes, hypertension, CHF, OSA, and COPD presents to the emergency department with cough, congestion, flulike symptoms with increasing shortness of breath.  Symptoms have developed over the past 6 to 7 days.  She is coughing up thick, yellow mucus and not responding to her home nebulizer treatments.  She called EMS who placed her on 2 L nasal cannula in the field with borderline hypoxemia.  Patient describes some chest heaviness/tightness.  She has had acute coronary syndrome in the past but states this feels more like prior episodes of pneumonia/COPD in terms of her chest pain quality.  Denies abdominal pain, vomiting, or diarrhea.  Patient feels like her legs are more swollen bilaterally but the left one feels more swollen than the right.    Past Medical History:  Diagnosis Date   Abdominal hernia    Anginal pain ([place])    occ from asthma   Anxiety    Arthritis    Asthma    Bipolar affective disorder ([place])    Schizophrenia   Bronchitis    hx of   CHF (congestive heart failure) ([place])    Depression    Diabetes mellitus without complication ([place])    " New Onset " per patient   GERD (gastroesophageal reflux disease)    Heart murmur    Hypertension    takes meds   Lymphedema 06/07/2018   Morbid (severe) obesity due to excess calories ([facility])    Neuropathy    left leg , "from back surgery"   Overactive bladder    Schizophrenia ([place])    Sciatica    Shortness of breath    Sleep apnea    Snoring disorder    Pt stated my boyfriend always wakes me up and tells me to breathe    Review of Systems  Constitutional: Positive fever/chills Cardiovascular: Positive chest pain. Respiratory: Positive shortness of  breath. Gastrointestinal: No abdominal pain.  No nausea, no vomiting.  No diarrhea.   Genitourinary: Negative for dysuria. Musculoskeletal: Negative for back pain. Skin: Negative for rash. Neurological: Negative for headaches, focal weakness or numbness.   ____________________________________________   PHYSICAL EXAM:  VITAL SIGNS: Vitals:   08/28/22 0806 08/28/22 0820  BP: (!) 156/77   Pulse: 63   Resp: 18   Temp: 98.1 F (36.7 C)   SpO2: 96% 98%   Constitutional: Alert and oriented. Well appearing and in no acute distress. Eyes: Conjunctivae are normal.  Head: Atraumatic. Nose: No congestion/rhinnorhea. Mouth/Throat: Mucous membranes are moist.  Neck: No stridor.  Cardiovascular: Normal rate, regular rhythm. Good peripheral circulation. Grossly normal heart sounds.   Respiratory: Slight increased respiratory effort.  No retractions. Lungs sounds are course, wheezing bilaterally.  Gastrointestinal: Soft and nontender. No distention.  Musculoskeletal: No lower extremity tenderness. Bilateral LE edema, non-pitting. No gross deformities of extremities. Neurologic:  Normal speech and language.  Skin:  Skin is warm, dry and intact. No rash noted. ____________________________________________   LABS (all labs ordered are listed, but only abnormal results are displayed)  Labs Reviewed  COMPREHENSIVE METABOLIC PANEL - Abnormal; Notable for the following components:      Result Value   Glucose, Bld 108 (*)    AST 14 (*)    All other  components within normal limits  HEMOGLOBIN A1C - Abnormal; Notable for the following components:   Hgb A1c MFr Bld 6.4 (*)    All other components within normal limits  GLUCOSE, CAPILLARY - Abnormal; Notable for the following components:   Glucose-Capillary 177 (*)    All other components within normal limits  GLUCOSE, CAPILLARY - Abnormal; Notable for the following components:   Glucose-Capillary 180 (*)    All other components within normal  limits  GLUCOSE, CAPILLARY - Abnormal; Notable for the following components:   Glucose-Capillary 180 (*)    All other components within normal limits  GLUCOSE, CAPILLARY - Abnormal; Notable for the following components:   Glucose-Capillary 170 (*)    All other components within normal limits  CBC - Abnormal; Notable for the following components:   WBC 13.4 (*)    Hemoglobin 15.4 (*)    All other components within normal limits  COMPREHENSIVE METABOLIC PANEL - Abnormal; Notable for the following components:   Sodium 134 (*)    Chloride 96 (*)    Glucose, Bld 232 (*)    BUN 30 (*)    Albumin 3.4 (*)    All other components within normal limits  GLUCOSE, CAPILLARY - Abnormal; Notable for the following components:   Glucose-Capillary 192 (*)    All other components within normal limits  GLUCOSE, CAPILLARY - Abnormal; Notable for the following components:   Glucose-Capillary 194 (*)    All other components within normal limits  GLUCOSE, CAPILLARY - Abnormal; Notable for the following components:   Glucose-Capillary 184 (*)    All other components within normal limits  GLUCOSE, CAPILLARY - Abnormal; Notable for the following components:   Glucose-Capillary 204 (*)    All other components within normal limits  CBG MONITORING, ED - Abnormal; Notable for the following components:   Glucose-Capillary 204 (*)    All other components within normal limits  RESP PANEL BY RT-PCR (RSV, FLU A&B, COVID)  RVPGX2  EXPECTORATED SPUTUM ASSESSMENT W GRAM STAIN, RFLX TO RESP C  CBC WITH DIFFERENTIAL/PLATELET  BRAIN NATRIURETIC PEPTIDE  LIPASE, BLOOD  HIV ANTIBODY (ROUTINE TESTING W REFLEX)  TROPONIN I (HIGH SENSITIVITY)  TROPONIN I (HIGH SENSITIVITY)   ____________________________________________  EKG   EKG Interpretation  Date/Time:  Wednesday August 26 2022 10:16:22 EST Ventricular Rate:  78 PR Interval:  202 QRS Duration: 88 QT Interval:  401 QTC Calculation: 457 R Axis:   24 Text  Interpretation: Sinus rhythm Borderline prolonged PR interval Confirmed by Nanda Quinton [phone]) on 08/26/2022 11:07:46 AM        ____________________________________________   PROCEDURES  Procedure(s) performed:   Procedures  CRITICAL CARE Performed by: Margette Fast Total critical care time: 35 minutes Critical care time was exclusive of separately billable procedures and treating other patients. Critical care was necessary to treat or prevent imminent or life-threatening deterioration. Critical care was time spent personally by me on the following activities: development of treatment plan with patient and/or surrogate as well as nursing, discussions with consultants, evaluation of patient's response to treatment, examination of patient, obtaining history from patient or surrogate, ordering and performing treatments and interventions, ordering and review of laboratory studies, ordering and review of radiographic studies, pulse oximetry and re-evaluation of patient's condition.  Nanda Quinton, MD Emergency Medicine  ____________________________________________   INITIAL IMPRESSION / ASSESSMENT AND PLAN / ED COURSE  Pertinent labs & imaging results that were available during my care of the patient were reviewed by me and considered  in my medical decision making (see chart for details).   This patient is Presenting for Evaluation of CP, which does require a range of treatment options, and is a complaint that involves a high risk of morbidity and mortality.  The Differential Diagnoses includes but is not exclusive to acute coronary syndrome, aortic dissection, pulmonary embolism, cardiac tamponade, community-acquired pneumonia, pericarditis, musculoskeletal chest wall pain, etc.  Critical Interventions-    Medications  nicotine (NICODERM CQ - dosed in mg/24 hours) patch 21 mg (21 mg Transdermal Patch Applied 08/28/22 1027)  oxyCODONE (Oxy IR/ROXICODONE) immediate release tablet 10  mg (10 mg Oral Given 08/28/22 1507)  atorvastatin (LIPITOR) tablet 40 mg (40 mg Oral Given 08/28/22 1022)  metoprolol succinate (TOPROL-XL) 24 hr tablet 50 mg (50 mg Oral Given 08/28/22 1023)  triamterene-hydrochlorothiazide (MAXZIDE-25) 37.5-25 MG per tablet 1 tablet (1 tablet Oral Given 08/28/22 1023)  irbesartan (AVAPRO) tablet 75 mg (75 mg Oral Given 08/28/22 1024)  ARIPiprazole (ABILIFY) tablet 20 mg (20 mg Oral Given 08/28/22 1022)  DULoxetine (CYMBALTA) DR capsule 20 mg (20 mg Oral Given 08/28/22 1024)  ursodiol (ACTIGALL) capsule 300 mg (300 mg Oral Given 08/28/22 1025)  fesoterodine (TOVIAZ) tablet 4 mg (4 mg Oral Given 08/28/22 1024)  gabapentin (NEURONTIN) capsule 300 mg (300 mg Oral Given 08/28/22 1507)  methocarbamol (ROBAXIN) tablet 500 mg (500 mg Oral Given 08/28/22 1308)  potassium chloride (KLOR-CON M) CR tablet 10 mEq (10 mEq Oral Given 08/28/22 1023)  albuterol (PROVENTIL) (2.5 MG/3ML) 0.083% nebulizer solution 2.5 mg (has no administration in time range)  triamcinolone cream (KENALOG) 0.1 % cream 1 Application (1 Application Topical Given 08/28/22 1027)  enoxaparin (LOVENOX) injection 60 mg (60 mg Subcutaneous Given 08/27/22 1706)  doxycycline (VIBRA-TABS) tablet 100 mg (100 mg Oral Given 08/28/22 1022)  methylPREDNISolone sodium succinate (SOLU-MEDROL) 40 mg/mL injection 40 mg (40 mg Intravenous Given 08/27/22 0643)    Followed by  predniSONE (DELTASONE) tablet 40 mg (40 mg Oral Given 08/28/22 1023)  insulin aspart (novoLOG) injection 0-20 Units (7 Units Subcutaneous Given 08/28/22 1221)  insulin aspart (novoLOG) injection 0-5 Units ( Subcutaneous Not Given 08/27/22 2146)  fluticasone furoate-vilanterol (BREO ELLIPTA) 200-25 MCG/ACT 1 puff (1 puff Inhalation Given 08/28/22 0819)  umeclidinium bromide (INCRUSE ELLIPTA) 62.5 MCG/ACT 1 puff (1 puff Inhalation Given 08/28/22 0820)  ketorolac (TORADOL) 15 MG/ML injection 15 mg (15 mg Intravenous Given 08/28/22 0305)  albuterol  (PROVENTIL) (2.5 MG/3ML) 0.083% nebulizer solution 2.5 mg (2.5 mg Nebulization Given 08/28/22 0819)  sodium chloride (OCEAN) 0.65 % nasal spray 1 spray (has no administration in time range)  insulin glargine-yfgn (SEMGLEE) injection 15 Units (15 Units Subcutaneous Given 08/28/22 1309)  furosemide (LASIX) injection 40 mg (40 mg Intravenous Given 08/28/22 1148)  ipratropium-albuterol (DUONEB) 0.5-2.5 (3) MG/3ML nebulizer solution 3 mL (3 mLs Nebulization Given 08/26/22 1227)  morphine (PF) 4 MG/ML injection 4 mg (4 mg Intravenous Given 08/26/22 1323)  iohexol (OMNIPAQUE) 350 MG/ML injection 75 mL (75 mLs Intravenous Contrast Given 08/26/22 1314)  ipratropium-albuterol (DUONEB) 0.5-2.5 (3) MG/3ML nebulizer solution 3 mL (3 mLs Nebulization Given 08/26/22 1524)  iohexol (OMNIPAQUE) 350 MG/ML injection 75 mL (75 mLs Intravenous Contrast Given 08/26/22 1753)  morphine (PF) 2 MG/ML injection 2 mg (2 mg Intravenous Given 08/27/22 0619)  furosemide (LASIX) injection 40 mg (40 mg Intravenous Given 08/27/22 1014)    Reassessment after intervention: Symptoms improving.    I did obtain Additional Historical Information from EMS.   I decided to review pertinent External Data, and in summary  patient with history of COPD and CHF.    Clinical Laboratory Tests Ordered, included CBC without anemia or severe leukocytosis. Troponin and BNP negative.   Radiologic Tests Ordered, included CXR. I independently interpreted the images and agree with radiology interpretation.   Cardiac Monitor Tracing which shows NSR.    Social Determinants of Health Risk patient is a smoker.   Consult complete with Hospitalist. Plan to admit for COPD mgmt and consideration of interval repeat CTA vs VQ scan.   Medical Decision Making: Summary:  Patient presents to the emergency department with productive cough, chest tightness, shortness of breath.  Mainly wheezing on exam.  Briefly requiring 2 L nasal cannula oxygen with EMS  but currently on room air here.  Plan for additional nebulizer, steroid, viral panel testing and chest x-ray along with troponin.  EKG not consistent with acute ischemic process but will follow troponins.  Will also obtain DVT study of the left lower extremity with some increased swelling.  Clinically presentation is less consistent with PE.  Reevaluation with update and discussion with patient. Continues with symptoms. No hypoxemia at rest. Agrees with plan for admit.   Patient's presentation is most consistent with acute presentation with potential threat to life or bodily function.   Disposition: admit  ____________________________________________  FINAL CLINICAL IMPRESSION(S) / ED DIAGNOSES  Final diagnoses:  COPD exacerbation ([place])  Precordial chest pain     Note:  This document was prepared using Dragon voice recognition software and may include unintentional dictation errors.  Nanda Quinton, MD, [facility] Emergency Medicine    Mikaella Escalona, [facility], MD 08/28/22 [phone]

## 2022-08-27 ENCOUNTER — Encounter (HOSPITAL_COMMUNITY): Payer: Self-pay | Admitting: Internal Medicine

## 2022-08-27 DIAGNOSIS — K219 Gastro-esophageal reflux disease without esophagitis: Secondary | ICD-10-CM | POA: Diagnosis present

## 2022-08-27 DIAGNOSIS — R0602 Shortness of breath: Secondary | ICD-10-CM | POA: Diagnosis not present

## 2022-08-27 DIAGNOSIS — Z7951 Long term (current) use of inhaled steroids: Secondary | ICD-10-CM | POA: Diagnosis not present

## 2022-08-27 DIAGNOSIS — I5032 Chronic diastolic (congestive) heart failure: Secondary | ICD-10-CM | POA: Diagnosis present

## 2022-08-27 DIAGNOSIS — M940 Chondrocostal junction syndrome [Tietze]: Secondary | ICD-10-CM | POA: Diagnosis present

## 2022-08-27 DIAGNOSIS — J441 Chronic obstructive pulmonary disease with (acute) exacerbation: Secondary | ICD-10-CM | POA: Diagnosis present

## 2022-08-27 DIAGNOSIS — R079 Chest pain, unspecified: Secondary | ICD-10-CM

## 2022-08-27 DIAGNOSIS — E785 Hyperlipidemia, unspecified: Secondary | ICD-10-CM | POA: Diagnosis present

## 2022-08-27 DIAGNOSIS — F17210 Nicotine dependence, cigarettes, uncomplicated: Secondary | ICD-10-CM | POA: Diagnosis present

## 2022-08-27 DIAGNOSIS — F319 Bipolar disorder, unspecified: Secondary | ICD-10-CM | POA: Diagnosis present

## 2022-08-27 DIAGNOSIS — Z6841 Body Mass Index (BMI) 40.0 and over, adult: Secondary | ICD-10-CM | POA: Diagnosis not present

## 2022-08-27 DIAGNOSIS — Z79899 Other long term (current) drug therapy: Secondary | ICD-10-CM | POA: Diagnosis not present

## 2022-08-27 DIAGNOSIS — Z8249 Family history of ischemic heart disease and other diseases of the circulatory system: Secondary | ICD-10-CM | POA: Diagnosis not present

## 2022-08-27 DIAGNOSIS — Z833 Family history of diabetes mellitus: Secondary | ICD-10-CM | POA: Diagnosis not present

## 2022-08-27 DIAGNOSIS — M1990 Unspecified osteoarthritis, unspecified site: Secondary | ICD-10-CM | POA: Diagnosis present

## 2022-08-27 DIAGNOSIS — F209 Schizophrenia, unspecified: Secondary | ICD-10-CM | POA: Diagnosis present

## 2022-08-27 DIAGNOSIS — Z1152 Encounter for screening for COVID-19: Secondary | ICD-10-CM | POA: Diagnosis not present

## 2022-08-27 DIAGNOSIS — G4733 Obstructive sleep apnea (adult) (pediatric): Secondary | ICD-10-CM | POA: Diagnosis present

## 2022-08-27 DIAGNOSIS — E1140 Type 2 diabetes mellitus with diabetic neuropathy, unspecified: Secondary | ICD-10-CM | POA: Diagnosis present

## 2022-08-27 DIAGNOSIS — Z886 Allergy status to analgesic agent status: Secondary | ICD-10-CM | POA: Diagnosis not present

## 2022-08-27 DIAGNOSIS — Z809 Family history of malignant neoplasm, unspecified: Secondary | ICD-10-CM | POA: Diagnosis not present

## 2022-08-27 DIAGNOSIS — Z7984 Long term (current) use of oral hypoglycemic drugs: Secondary | ICD-10-CM | POA: Diagnosis not present

## 2022-08-27 DIAGNOSIS — I110 Hypertensive heart disease with heart failure: Secondary | ICD-10-CM | POA: Diagnosis present

## 2022-08-27 LAB — GLUCOSE, CAPILLARY
Glucose-Capillary: 180 mg/dL — ABNORMAL HIGH (ref 70–99)
Glucose-Capillary: 180 mg/dL — ABNORMAL HIGH (ref 70–99)
Glucose-Capillary: 170 mg/dL — ABNORMAL HIGH (ref 70–99)
Glucose-Capillary: 192 mg/dL — ABNORMAL HIGH (ref 70–99)

## 2022-08-27 LAB — HEMOGLOBIN A1C
Hgb A1c MFr Bld: 6.4 % — ABNORMAL HIGH (ref 4.8–5.6)
Mean Plasma Glucose: 137 mg/dL

## 2022-08-27 MED ORDER — FUROSEMIDE 10 MG/ML IJ SOLN
40.0000 mg | Freq: Once | INTRAMUSCULAR | Status: AC
  Administered 2022-08-27: 40 mg via INTRAVENOUS
  Filled 2022-08-27: qty 4

## 2022-08-27 MED ORDER — SALINE SPRAY 0.65 % NA SOLN
1.0000 | NASAL | Status: DC | PRN
  Administered 2022-08-28: 1 via NASAL
  Filled 2022-08-27 (×2): qty 44

## 2022-08-27 MED ORDER — KETOROLAC TROMETHAMINE 15 MG/ML IJ SOLN
15.0000 mg | Freq: Four times a day (QID) | INTRAMUSCULAR | Status: DC | PRN
  Administered 2022-08-28 – 2022-08-29 (×2): 15 mg via INTRAVENOUS
  Filled 2022-08-27 (×2): qty 1

## 2022-08-27 MED ORDER — ALBUTEROL SULFATE (2.5 MG/3ML) 0.083% IN NEBU
2.5000 mg | INHALATION_SOLUTION | Freq: Two times a day (BID) | RESPIRATORY_TRACT | Status: DC
  Administered 2022-08-28 (×2): 2.5 mg via RESPIRATORY_TRACT
  Filled 2022-08-27 (×4): qty 3

## 2022-08-27 MED ORDER — MORPHINE SULFATE (PF) 2 MG/ML IV SOLN
2.0000 mg | Freq: Once | INTRAVENOUS | Status: AC
  Administered 2022-08-27: 2 mg via INTRAVENOUS
  Filled 2022-08-27: qty 1

## 2022-08-27 NOTE — Progress Notes (Addendum)
[facility] PROGRESS NOTE   Lori Bean FXT:[id number] DOB: June 06, 1971 DOA: 08/26/2022  PCP: [name], PA  Brief History/Interval Summary: 51 y.o. female with medical history significant of COPD Gold stage II, HTN, HLD, bipolar disorder, morbid obesity, chronic lymphedema, presented with worsening of cough wheezing shortness of breath and chest pain.  Consultants: None  Procedures: None    Subjective/Interval History: Patient complains of right-sided chest pain worse with deep breaths and also worse with movement.  Continues to have some shortness of breath.  No nausea or vomiting.  Anxious.    Assessment/Plan:  Chest pain Some component of pain is suggestive of pleurisy but there is also a musculoskeletal element.  Patient had 2 different CT angiograms.  Initial 1 suggested PE.  Subsequent 1 did not show any PE.  Symptoms could be due to pleurisy as mentioned.  However she is tender to palpation as well. Patient was reassured.  Troponin levels have been normal. Renal function is normal.  Will use NSAIDs.  Acute COPD exacerbation Failed outpatient treatment.  Started on steroids.  Continue nebulizer treatments. Continue doxycycline.  Incentive spirometry and flutter valve.  Smoking cessation counseling was provided. Patient noted to have lower extremity edema.  Will give Lasix x 1.  She mentions that she takes Lasix at home but not listed on her home medication list.  Echocardiogram from July 2022 showed normal systolic function.  Essential hypertension Continue current regimen.  Noted to be on irbesartan, metoprolol.  HCTZ is on hold.  Diabetes mellitus type 2 Holding metformin.  SSI.  Diabetic neuropathy Continue gabapentin.  Bipolar disorder Continue Abilify and Cymbalta   DVT Prophylaxis: Lovenox Code Status: Full code Family Communication: Discussed with patient Disposition Plan: Hopefully return home in improved  Status is:  Observation The patient will require care spanning > 2 midnights and should be moved to inpatient because: Persistent shortness of breath and chest discomfort      Medications: Scheduled:  albuterol  2.5 mg Nebulization Q6H   ARIPiprazole  20 mg Oral Daily   atorvastatin  40 mg Oral Daily   doxycycline  100 mg Oral Q12H   DULoxetine  20 mg Oral BID   enoxaparin (LOVENOX) injection  60 mg Subcutaneous Q24H   fesoterodine  4 mg Oral Daily   fluticasone furoate-vilanterol  1 puff Inhalation Daily   gabapentin  300 mg Oral TID   insulin aspart  0-20 Units Subcutaneous TID WC   insulin aspart  0-5 Units Subcutaneous QHS   irbesartan  75 mg Oral Daily   metoprolol succinate  50 mg Oral Daily   nicotine  21 mg Transdermal Daily   oxyCODONE  10 mg Oral 6 X Daily   potassium chloride  10 mEq Oral BID   predniSONE  40 mg Oral Q breakfast   triamcinolone cream  1 Application Topical BID   triamterene-hydrochlorothiazide  1 tablet Oral Daily   umeclidinium bromide  1 puff Inhalation Daily   ursodiol  300 mg Oral BID   Continuous: GDJ:MEQASTMHD, methocarbamol  Antibiotics: Anti-infectives (From admission, onward)    Start     Dose/Rate Route Frequency Ordered Stop   08/26/22 1800  doxycycline (VIBRA-TABS) tablet 100 mg        100 mg Oral Every 12 hours 08/26/22 1503 08/31/22 2159       Objective:  Vital Signs  Vitals:   08/27/22 0447 08/27/22 0523 08/27/22 0823 08/27/22 0914  BP: 127/67 (!) 169/98 (!) 185/96  Pulse: 77 82 75   Resp: '18 20 17   '$ Temp: 98.7 F (37.1 C) 98 F (36.7 C) 97.7 F (36.5 C)   TempSrc:  Oral Oral   SpO2: 97% 100% 92% 99%  Weight:      Height:        Intake/Output Summary (Last 24 hours) at 08/27/2022 0918 Last data filed at 08/26/2022 2040 Gross per 24 hour  Intake 640 ml  Output --  Net 640 ml   Filed Weights   08/26/22 1011 08/26/22 1848  Weight: 122.5 kg 120.5 kg    General appearance: Awake alert.  In no distress Resp:  Tachypneic.  Wheezing heard bilaterally.  No rhonchi.  Few crackles at the bases. Tender to palpate over the right chest wall at the area of concern.  No rashes noted Cardio: S1-S2 is normal regular.  No S3-S4.  No rubs murmurs or bruit GI: Abdomen is soft.  Nontender nondistended.  Bowel sounds are present normal.  No masses organomegaly Extremities: Pedal edema is noted Neurologic: Alert and oriented x3.  No focal neurological deficits.    Lab Results:  Data Reviewed: I have personally reviewed following labs and reports of the imaging studies  CBC: Recent Labs  Lab 08/26/22 1042  WBC 9.1  NEUTROABS 6.0  HGB 14.5  HCT 44.5  MCV 95.1  PLT 315    Basic Metabolic Panel: Recent Labs  Lab 08/26/22 1042  NA 139  K 4.0  CL 103  CO2 26  GLUCOSE 108*  BUN 8  CREATININE 0.67  CALCIUM 9.7    GFR: Estimated Creatinine Clearance: 101.7 mL/min (by C-G formula based on SCr of 0.67 mg/dL).  Liver Function Tests: Recent Labs  Lab 08/26/22 1042  AST 14*  ALT 26  ALKPHOS 88  BILITOT 0.7  PROT 7.3  ALBUMIN 3.8    Recent Labs  Lab 08/26/22 1042  LIPASE 32    HbA1C: Recent Labs    08/26/22 1530  HGBA1C 6.4*    CBG: Recent Labs  Lab 08/26/22 1754 08/26/22 2227  GLUCAP 204* 177*     Recent Results (from the past 240 hour(s))  Resp panel by RT-PCR (RSV, Flu A&B, Covid) Anterior Nasal Swab     Status: None   Collection Time: 08/26/22 10:31 AM   Specimen: Anterior Nasal Swab  Result Value Ref Range Status   SARS Coronavirus 2 by RT PCR NEGATIVE NEGATIVE Final    Comment: (NOTE) SARS-CoV-2 target nucleic acids are NOT DETECTED.  The SARS-CoV-2 RNA is generally detectable in upper respiratory specimens during the acute phase of infection. The lowest concentration of SARS-CoV-2 viral copies this assay can detect is 138 copies/mL. A negative result does not preclude SARS-Cov-2 infection and should not be used as the sole basis for treatment or other patient  management decisions. A negative result may occur with  improper specimen collection/handling, submission of specimen other than nasopharyngeal swab, presence of viral mutation(s) within the areas targeted by this assay, and inadequate number of viral copies(<138 copies/mL). A negative result must be combined with clinical observations, patient history, and epidemiological information. The expected result is Negative.  Fact Sheet for Patients:  EntrepreneurPulse.com.au  Fact Sheet for Healthcare Providers:  IncredibleEmployment.be  This test is no t yet approved or cleared by the [place] FDA and  has been authorized for detection and/or diagnosis of SARS-CoV-2 by FDA under an Emergency Use Authorization (EUA). This EUA will remain  in effect (meaning this test can be  used) for the duration of the COVID-19 declaration under Section 564(b)(1) of the Act, 21 U.S.C.section 360bbb-3(b)(1), unless the authorization is terminated  or revoked sooner.       Influenza A by PCR NEGATIVE NEGATIVE Final   Influenza B by PCR NEGATIVE NEGATIVE Final    Comment: (NOTE) The Xpert Xpress SARS-CoV-2/FLU/RSV plus assay is intended as an aid in the diagnosis of influenza from Nasopharyngeal swab specimens and should not be used as a sole basis for treatment. Nasal washings and aspirates are unacceptable for Xpert Xpress SARS-CoV-2/FLU/RSV testing.  Fact Sheet for Patients: EntrepreneurPulse.com.au  Fact Sheet for Healthcare Providers: IncredibleEmployment.be  This test is not yet approved or cleared by the [place] FDA and has been authorized for detection and/or diagnosis of SARS-CoV-2 by FDA under an Emergency Use Authorization (EUA). This EUA will remain in effect (meaning this test can be used) for the duration of the COVID-19 declaration under Section 564(b)(1) of the Act, 21 U.S.C. section 360bbb-3(b)(1),  unless the authorization is terminated or revoked.     Resp Syncytial Virus by PCR NEGATIVE NEGATIVE Final    Comment: (NOTE) Fact Sheet for Patients: EntrepreneurPulse.com.au  Fact Sheet for Healthcare Providers: IncredibleEmployment.be  This test is not yet approved or cleared by the [place] FDA and has been authorized for detection and/or diagnosis of SARS-CoV-2 by FDA under an Emergency Use Authorization (EUA). This EUA will remain in effect (meaning this test can be used) for the duration of the COVID-19 declaration under Section 564(b)(1) of the Act, 21 U.S.C. section 360bbb-3(b)(1), unless the authorization is terminated or revoked.  Performed at [facility], [place] [address] [place], [place] [address]       Radiology Studies: VAS [place] LOWER EXTREMITY VENOUS (DVT) (7a-7p)  Result Date: 08/26/2022  Lower Venous DVT Study Patient Name:  TRISHA KEN  Date of Exam:   08/26/2022 Medical Rec #: [id number]             Accession #:    [id number] Date of Birth: Aug 10, 1971             Patient Gender: F Patient Age:   58 years Exam Location:  [facility] Procedure:      VAS [place] LOWER EXTREMITY VENOUS (DVT) Referring Phys: JOSHUA LONG --------------------------------------------------------------------------------  Indications: Swelling, Pain, and SOB.  Limitations: Body habitus and poor ultrasound/tissue interface. Comparison Study: 04/02/22 - Negative reflux study                   06-30-21 - Negative LEV Performing Technologist: Velva Harman Sturdivant RDMS, RVT  Examination Guidelines: A complete evaluation includes B-mode imaging, spectral Doppler, color Doppler, and power Doppler as needed of all accessible portions of each vessel. Bilateral testing is considered an integral part of a complete examination. Limited examinations for reoccurring indications may be performed as noted. The reflux portion of the exam is performed with the  patient in reverse Trendelenburg.  +-----+---------------+---------+-----------+----------+--------------+ RIGHTCompressibilityPhasicitySpontaneityPropertiesThrombus Aging +-----+---------------+---------+-----------+----------+--------------+ CFV  Full           Yes      Yes                                 +-----+---------------+---------+-----------+----------+--------------+ SFJ  Full                                                        +-----+---------------+---------+-----------+----------+--------------+   +---------+---------------+---------+-----------+----------+-------------------+  LEFT     CompressibilityPhasicitySpontaneityPropertiesThrombus Aging      +---------+---------------+---------+-----------+----------+-------------------+ CFV      Full           Yes      Yes                                      +---------+---------------+---------+-----------+----------+-------------------+ SFJ      Full                                                             +---------+---------------+---------+-----------+----------+-------------------+ FV Prox  Full                                                             +---------+---------------+---------+-----------+----------+-------------------+ FV Mid   Full                                                             +---------+---------------+---------+-----------+----------+-------------------+ FV DistalFull                                                             +---------+---------------+---------+-----------+----------+-------------------+ PFV      Full                                                             +---------+---------------+---------+-----------+----------+-------------------+ POP      Full           Yes      Yes                                      +---------+---------------+---------+-----------+----------+-------------------+ PTV      Full                                                              +---------+---------------+---------+-----------+----------+-------------------+ PERO     Full                                         Not well  visualized, appears                                                       patent              +---------+---------------+---------+-----------+----------+-------------------+     Summary: RIGHT: - No evidence of common femoral vein obstruction.  LEFT: - There is no evidence of deep vein thrombosis in the lower extremity.  *See table(s) above for measurements and observations. Electronically signed by Orlie Pollen on 08/26/2022 at 8:21:19 PM.    Final    CT Angio Chest Pulmonary Embolism (PE) W or WO Contrast  Result Date: 08/26/2022 CLINICAL DATA:  High probability for PE EXAM: CT ANGIOGRAPHY CHEST WITH CONTRAST TECHNIQUE: Multidetector CT imaging of the chest was performed using the standard protocol during bolus administration of intravenous contrast. Multiplanar CT image reconstructions and MIPs were obtained to evaluate the vascular anatomy. RADIATION DOSE REDUCTION: This exam was performed according to the departmental dose-optimization program which includes automated exposure control, adjustment of the mA and/or kV according to patient size and/or use of iterative reconstruction technique. CONTRAST:  14m OMNIPAQUE IOHEXOL 350 MG/ML SOLN COMPARISON:  CT angiogram chest 08/26/2022 FINDINGS: Cardiovascular: Satisfactory opacification of the pulmonary arteries to the segmental level. No evidence of pulmonary embolism. Heart is mildly enlarged. No pericardial effusion. Mediastinum/Nodes: No enlarged mediastinal, hilar, or axillary lymph nodes. Thyroid gland, trachea, and esophagus demonstrate no significant findings. Lungs/Pleura: Scattered linear and patchy opacities are seen in the bilateral lower lobes and lingula similar to the  prior study. No pleural effusion pneumothorax identified. Upper Abdomen: No acute abnormality. Musculoskeletal: No chest wall abnormality. No acute or significant osseous findings. Review of the MIP images confirms the above findings. IMPRESSION: 1. No evidence for pulmonary embolism. 2. Mild cardiomegaly. 3. Scattered linear and patchy opacities in the lower lobes and lingula similar to the prior study. Findings may represent atelectasis or infection. Electronically Signed   By: ARonney AstersM.D.   On: 08/26/2022 18:02   CT Angio Chest PE W and/or Wo Contrast  Result Date: 08/26/2022 CLINICAL DATA:  Pulmonary embolism (PE) suspected, high prob EXAM: CT ANGIOGRAPHY CHEST WITH CONTRAST TECHNIQUE: Multidetector CT imaging of the chest was performed using the standard protocol during bolus administration of intravenous contrast. Multiplanar CT image reconstructions and MIPs were obtained to evaluate the vascular anatomy. RADIATION DOSE REDUCTION: This exam was performed according to the departmental dose-optimization program which includes automated exposure control, adjustment of the mA and/or kV according to patient size and/or use of iterative reconstruction technique. CONTRAST:  753mOMNIPAQUE IOHEXOL 350 MG/ML SOLN COMPARISON:  CT a chest 04/02/2021. FINDINGS: Cardiovascular: Suboptimal opacification of the pulmonary arteries, essentially nondiagnostic. Within this limitation, there is a questionable filling defect in a right lower lobe segmental branch (series 5, image 82). Mild cardiomegaly.No pericardial disease. Thoracic aorta is unremarkable. Mediastinum/Nodes: No lymphadenopathy. The thyroid is unremarkable. Lungs/Pleura: There is a 4 mm right upper lobe pulmonary nodule (series 6, image 48), stable since December 2020 and benign. No new suspicious pulmonary nodules. Bibasilar subsegmental atelectasis. No pleural effusion or pneumothorax. Mild lower lung predominant bronchial wall thickening. Lingular  subsegmental atelectasis. No focal airspace consolidation. Upper Abdomen: Left adrenal thickening without discrete nodule. This is unchanged from prior exam. Musculoskeletal: No acute osseous abnormality. Multilevel degenerative changes spine. No suspicious  osseous lesion. Review of the MIP images confirms the above findings. IMPRESSION: Suboptimal opacification of pulmonary arteries, essentially nondiagnostic. Within this limitation, there is a questionable filling defect in a right lower lobe segmental branch which could represent an embolus or artifact. Consider short-term repeat CTA of the chest or lower extremity Doppler ultrasound. Lower lung predominant bronchial wall thickening, suggesting bronchitis. Bibasilar subsegmental atelectasis.  No airspace consolidation. Electronically Signed   By: Maurine Simmering M.D.   On: 08/26/2022 13:51   DG Chest Portable 1 View  Result Date: 08/26/2022 CLINICAL DATA:  Shortness of breath, cough EXAM: PORTABLE CHEST 1 VIEW COMPARISON:  Portable exam 1047 hours compared to 04/23/2021 FINDINGS: Borderline enlargement of cardiac silhouette. Mediastinal contours and pulmonary vascularity normal. Minimal bibasilar atelectasis. Lungs otherwise clear. No pulmonary infiltrate, pleural effusion, or pneumothorax. Osseous structures unremarkable. IMPRESSION: Minimal bibasilar atelectasis. Electronically Signed   By: Lavonia Dana M.D.   On: 08/26/2022 11:26       LOS: 0 days   [facility] Pager on [email or web address]  08/27/2022, 9:18 AM

## 2022-08-27 NOTE — Progress Notes (Signed)
0523: Patient complaining of R sided chest pain radiating into her shoulder. Crosley MD notified. New orders obtained for EKG. Vital signs within normal limits other than BP of 169/98, provider aware. EKG result NSR, MD notified of results. EKG strip placed in chart.

## 2022-08-27 NOTE — TOC Initial Note (Signed)
Transition of Care [facility]) - Initial/Assessment Note    Patient Details  Name: Lori Bean MRN: [id number] Date of Birth: 27-May-1971  [facility] CM/SW Contact:    Ninfa Meeker, RN Phone Number: 08/27/2022, 10:40 AM  Clinical Narrative:                 Transition of Care Initial Screening Note:  Transition of Care [facility]) Department has reviewed patient and no TOC needs have been identified at this time. We will continue to monitor patient advancement through Interdisciplinary progressions and if new patient needs arise, please place a consult.        Patient Goals and CMS Choice        Expected Discharge Plan and Services                                                Prior Living Arrangements/Services                       Activities of Daily Living Home Assistive Devices/Equipment: CPAP, Cane (specify quad or straight), Walker (specify type), Nebulizer ADL Screening (condition at time of admission) Patient's cognitive ability adequate to safely complete daily activities?: Yes Is the patient deaf or have difficulty hearing?: No Does the patient have difficulty seeing, even when wearing glasses/contacts?: No Does the patient have difficulty concentrating, remembering, or making decisions?: No Patient able to express need for assistance with ADLs?: No Does the patient have difficulty dressing or bathing?: No Independently performs ADLs?: Yes (appropriate for developmental age) Does the patient have difficulty walking or climbing stairs?: Yes Weakness of Legs: Both Weakness of Arms/Hands: None  Permission Sought/Granted                  Emotional Assessment              Admission diagnosis:  COPD (chronic obstructive pulmonary disease) ([place]) [J44.9] Precordial chest pain [R07.2] COPD exacerbation ([place]) [J44.1] Patient Active Problem List   Diagnosis Date Noted   COPD (chronic obstructive pulmonary disease)  ([place]) 08/26/2022   Demand ischemia    Neck pain 10/18/2020   Diabetes mellitus type 2 in obese ([place]) 06/28/2020   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   COPD mixed type ([place]) 08/17/2018   Sinusitis 07/22/2018   Lymphedema 06/07/2018   Abnormal uterine bleeding (AUB) 12/12/2017   CHF (congestive heart failure) ([place])    Dyspnea on exertion 05/05/2017   Morbid obesity due to excess calories ([place]) 05/05/2017   Chondromalacia, right knee 97/10/6376   Synovial plica of right knee 58/85/0277   Tear of medial meniscus of right knee, initial encounter 09/28/2016   OSA (obstructive sleep apnea) 09/23/2016   Chronic diastolic heart failure ([place]) 04/22/2016   Cigarette smoker 04/03/2014   Chest pain 04/03/2014   Lumbar spondylosis 12/28/2013   Extrinsic asthma 06/07/2013   Anxiety    Essential hypertension    Bipolar affective disorder ([place])    GERD (gastroesophageal reflux disease)    Arthritis    Schizophrenia ([place])    Overactive bladder    PCP:  [name], PA Pharmacy:   [facility] [address] [place] [address] Phone: [phone] Fax: [place], [place]  AT [facility] [place] [address] Phone: [phone] Fax: [phone]     Social Determinants of Health (SDOH) Interventions    Readmission Risk Interventions    03/14/2021    1:14 PM  Readmission Risk Prevention Plan  Transportation Screening Complete  PCP or Specialist Appt within 3-5 Days Complete  HRI or [place] Complete  Social Work Consult for [place] Planning/Counseling Complete  Palliative Care Screening Not Applicable  Medication Review Press photographer) Complete

## 2022-08-27 NOTE — Plan of Care (Signed)
  Problem: Activity: Goal: Ability to tolerate increased activity will improve Outcome: Progressing   Problem: Respiratory: Goal: Ability to maintain a clear airway will improve Outcome: Progressing   

## 2022-08-28 LAB — COMPREHENSIVE METABOLIC PANEL
ALT: 21 U/L (ref 0–44)
AST: 16 U/L (ref 15–41)
Albumin: 3.4 g/dL — ABNORMAL LOW (ref 3.5–5.0)
Alkaline Phosphatase: 79 U/L (ref 38–126)
Anion gap: 13 (ref 5–15)
BUN: 30 mg/dL — ABNORMAL HIGH (ref 6–20)
CO2: 25 mmol/L (ref 22–32)
Calcium: 9.7 mg/dL (ref 8.9–10.3)
Chloride: 96 mmol/L — ABNORMAL LOW (ref 98–111)
Creatinine, Ser: 0.96 mg/dL (ref 0.44–1.00)
GFR, Estimated: >60 mL/min (ref >60)
Glucose, Bld: 232 mg/dL — ABNORMAL HIGH (ref 70–99)
Potassium: 4.6 mmol/L (ref 3.5–5.1)
Sodium: 134 mmol/L — ABNORMAL LOW (ref 135–145)
Total Bilirubin: 0.5 mg/dL (ref 0.3–1.2)
Total Protein: 6.9 g/dL (ref 6.5–8.1)

## 2022-08-28 LAB — GLUCOSE, CAPILLARY
Glucose-Capillary: 194 mg/dL — ABNORMAL HIGH (ref 70–99)
Glucose-Capillary: 184 mg/dL — ABNORMAL HIGH (ref 70–99)
Glucose-Capillary: 204 mg/dL — ABNORMAL HIGH (ref 70–99)
Glucose-Capillary: 243 mg/dL — ABNORMAL HIGH (ref 70–99)
Glucose-Capillary: 196 mg/dL — ABNORMAL HIGH (ref 70–99)

## 2022-08-28 LAB — CBC
HCT: 44.6 % (ref 36.0–46.0)
Hemoglobin: 15.4 g/dL — ABNORMAL HIGH (ref 12.0–15.0)
MCH: 32 pg (ref 26.0–34.0)
MCHC: 34.5 g/dL (ref 30.0–36.0)
MCV: 92.5 fL (ref 80.0–100.0)
Platelets: 228 10*3/uL (ref 150–400)
RBC: 4.82 MIL/uL (ref 3.87–5.11)
RDW: 13.1 % (ref 11.5–15.5)
WBC: 13.4 10*3/uL — ABNORMAL HIGH (ref 4.0–10.5)
nRBC: 0 % (ref 0.0–0.2)

## 2022-08-28 MED ORDER — FUROSEMIDE 10 MG/ML IJ SOLN
40.0000 mg | Freq: Two times a day (BID) | INTRAMUSCULAR | Status: DC
  Administered 2022-08-28 – 2022-08-29 (×3): 40 mg via INTRAVENOUS
  Filled 2022-08-28 (×3): qty 4

## 2022-08-28 MED ORDER — INSULIN GLARGINE-YFGN 100 UNIT/ML ~~LOC~~ SOLN
15.0000 [IU] | Freq: Every day | SUBCUTANEOUS | Status: DC
  Administered 2022-08-28 – 2022-08-29 (×2): 15 [IU] via SUBCUTANEOUS
  Filled 2022-08-28 (×2): qty 0.15

## 2022-08-28 NOTE — Progress Notes (Signed)
PROGRESS NOTE    Lori Bean  YFV:[id number] DOB: [birth year] DOA: 08/26/2022 PCP: [name], PA   Brief Narrative:  51 y.o. female with medical history significant of COPD Gold stage II, HTN, HLD, bipolar disorder, morbid obesity, chronic lymphedema, presented with worsening of cough wheezing shortness of breath and chest pain.   Assessment & Plan:   Principal Problem:   COPD (chronic obstructive pulmonary disease) (HCC) Active Problems:   Essential hypertension   Chest pain   OSA (obstructive sleep apnea)   Lymphedema   COPD mixed type (HCC)   COPD with acute exacerbation (HCC)  Chest pain: Resolved.  Pleuritic was positive, treated with ibuprofen.  Troponins negative.  Acute COPD exacerbation: Patient tachycardic she is feeling better, still slightly symptomatic.  Continue nebulizer treatment with doxycycline as well as Solu-Medrol.  Chronic lymphedema: Patient seen at she takes Lasix 40 mg p.o. twice daily at home and that she has congestive heart failure and she sees cardiologist Dr. Oval Linsey. Notes, I was able to review the recent note about 2 months ago from cardiology and surprisingly, they have documented chronic diastolic congestive heart failure but patient's recent echo did not show any diastolic congestive heart failure.  Per their note, there is no indication for loop diuretics so it appears that she was never prescribed but patient continues to tell us that she is taking Lasix at home.  She does have bilateral upper extremity as well as lower extremity edema.  I will give her Lasix IV 40 mg x 2 today and then reassess tomorrow morning.  Essential hypertension: Controlled for most part.  Continue Avapro, Toprol-XL, Maxide.  Type 2 diabetes mellitus: Recent hemoglobin A1c 6.4 patient takes metformin and glipizide at home which are held here.  Blood sugar elevated, likely in the setting of steroids, will start on Semglee 15 units and continue  SSI.  Diabetic neuropathy: Continue gabapentin.  Blood pressure disorder: Continue Abilify and Cymbalta.  Nicotine dependence: Lengthy discussion with the patient and I counseled her about quitting, she said she is ready and she does appear to be motivated.  DVT prophylaxis: Lovenox   Code Status: Full Code  Family Communication:  None present at bedside.  Plan of care discussed with patient in length and he/she verbalized understanding and agreed with it.  Status is: Inpatient Remains inpatient appropriate because: Still slightly symptomatic edematous, with reassess tomorrow for discharge.   Estimated body mass index is 48.59 kg/m as calculated from the following:   Height as of this encounter: '5\' 2"'$  (1.575 m).   Weight as of this encounter: 120.5 kg.    Nutritional Assessment: Body mass index is 48.59 kg/m.[name] Seen by dietician.  I agree with the assessment and plan as outlined below: Nutrition Status:        . Skin Assessment: I have examined the patient's skin and I agree with the wound assessment as performed by the wound care RN as outlined below:    Consultants:  None  Procedures:  none  Antimicrobials:  Anti-infectives (From admission, onward)    Start     Dose/Rate Route Frequency Ordered Stop   08/26/22 1800  doxycycline (VIBRA-TABS) tablet 100 mg        100 mg Oral Every 12 hours 08/26/22 1503 08/31/22 2159         Subjective: Patient seen and examined.  Feels better than yesterday but slightly symptomatic still.  Objective: Vitals:   08/27/22 1951 08/28/22 0341 08/28/22 0806 08/28/22 0820  BP: (!) 141/89 (!) 145/90 (!) 156/77   Pulse: 69 65 63   Resp: '19 17 18   '$ Temp: 97.8 F (36.6 C) 97.9 F (36.6 C) 98.1 F (36.7 C)   TempSrc: Oral Oral Oral   SpO2: 100% 100% 96% 98%  Weight:      Height:        Intake/Output Summary (Last 24 hours) at 08/28/2022 1114 Last data filed at 08/27/2022 2030 Gross per 24 hour  Intake 240 ml  Output --   Net 240 ml   Filed Weights   08/26/22 1011 08/26/22 1848  Weight: 122.5 kg 120.5 kg    Examination:  General exam: Appears calm and comfortable, obese Respiratory system: Expiratory wheezes. Respiratory effort normal. Cardiovascular system: S1 & S2 heard, RRR. No JVD, murmurs, rubs, gallops or clicks.  +2 bilateral upper and lower extremity edema. Gastrointestinal system: Abdomen is nondistended, soft and nontender. No organomegaly or masses felt. Normal bowel sounds heard. Central nervous system: Alert and oriented. No focal neurological deficits. Extremities: Symmetric 5 x 5 power. Skin: No rashes, lesions or ulcers Psychiatry: Judgement and insight appear normal. Mood & affect appropriate.    Data Reviewed: I have personally reviewed following labs and imaging studies  CBC: Recent Labs  Lab 08/26/22 1042 08/28/22 0558  WBC 9.1 13.4*  NEUTROABS 6.0  --   HGB 14.5 15.4*  HCT 44.5 44.6  MCV 95.1 92.5  PLT 211 712   Basic Metabolic Panel: Recent Labs  Lab 08/26/22 1042 08/28/22 0558  NA 139 134*  K 4.0 4.6  CL 103 96*  CO2 26 25  GLUCOSE 108* 232*  BUN 8 30*  CREATININE 0.67 0.96  CALCIUM 9.7 9.7   GFR: Estimated Creatinine Clearance: 84.7 mL/min (by C-G formula based on SCr of 0.96 mg/dL). Liver Function Tests: Recent Labs  Lab 08/26/22 1042 08/28/22 0558  AST 14* 16  ALT 26 21  ALKPHOS 88 79  BILITOT 0.7 0.5  PROT 7.3 6.9  ALBUMIN 3.8 3.4*   Recent Labs  Lab 08/26/22 1042  LIPASE 32   No results for input(s): "AMMONIA" in the last 168 hours. Coagulation Profile: No results for input(s): "INR", "PROTIME" in the last 168 hours. Cardiac Enzymes: No results for input(s): "CKTOTAL", "CKMB", "CKMBINDEX", "TROPONINI" in the last 168 hours. BNP (last 3 results) No results for input(s): "PROBNP" in the last 8760 hours. HbA1C: Recent Labs    08/26/22 1530  HGBA1C 6.4*   CBG: Recent Labs  Lab 08/27/22 1151 08/27/22 1649 08/27/22 2141  08/28/22 0815 08/28/22 0947  GLUCAP 180*  180* 170* 192* 194* 184*   Lipid Profile: No results for input(s): "CHOL", "HDL", "LDLCALC", "TRIG", "CHOLHDL", "LDLDIRECT" in the last 72 hours. Thyroid Function Tests: No results for input(s): "TSH", "T4TOTAL", "FREET4", "T3FREE", "THYROIDAB" in the last 72 hours. Anemia Panel: No results for input(s): "VITAMINB12", "FOLATE", "FERRITIN", "TIBC", "IRON", "RETICCTPCT" in the last 72 hours. Sepsis Labs: No results for input(s): "PROCALCITON", "LATICACIDVEN" in the last 168 hours.  Recent Results (from the past 240 hour(s))  Resp panel by RT-PCR (RSV, Flu A&B, Covid) Anterior Nasal Swab     Status: None   Collection Time: 08/26/22 10:31 AM   Specimen: Anterior Nasal Swab  Result Value Ref Range Status   SARS Coronavirus 2 by RT PCR NEGATIVE NEGATIVE Final    Comment: (NOTE) SARS-CoV-2 target nucleic acids are NOT DETECTED.  The SARS-CoV-2 RNA is generally detectable in upper respiratory specimens during the acute phase of infection. The  lowest concentration of SARS-CoV-2 viral copies this assay can detect is 138 copies/mL. A negative result does not preclude SARS-Cov-2 infection and should not be used as the sole basis for treatment or other patient management decisions. A negative result may occur with  improper specimen collection/handling, submission of specimen other than nasopharyngeal swab, presence of viral mutation(s) within the areas targeted by this assay, and inadequate number of viral copies(<138 copies/mL). A negative result must be combined with clinical observations, patient history, and epidemiological information. The expected result is Negative.  Fact Sheet for Patients:  EntrepreneurPulse.com.au  Fact Sheet for Healthcare Providers:  IncredibleEmployment.be  This test is no t yet approved or cleared by the [place] FDA and  has been authorized for detection and/or diagnosis  of SARS-CoV-2 by FDA under an Emergency Use Authorization (EUA). This EUA will remain  in effect (meaning this test can be used) for the duration of the COVID-19 declaration under Section 564(b)(1) of the Act, 21 U.S.C.section 360bbb-3(b)(1), unless the authorization is terminated  or revoked sooner.       Influenza A by PCR NEGATIVE NEGATIVE Final   Influenza B by PCR NEGATIVE NEGATIVE Final    Comment: (NOTE) The Xpert Xpress SARS-CoV-2/FLU/RSV plus assay is intended as an aid in the diagnosis of influenza from Nasopharyngeal swab specimens and should not be used as a sole basis for treatment. Nasal washings and aspirates are unacceptable for Xpert Xpress SARS-CoV-2/FLU/RSV testing.  Fact Sheet for Patients: EntrepreneurPulse.com.au  Fact Sheet for Healthcare Providers: IncredibleEmployment.be  This test is not yet approved or cleared by the [place] FDA and has been authorized for detection and/or diagnosis of SARS-CoV-2 by FDA under an Emergency Use Authorization (EUA). This EUA will remain in effect (meaning this test can be used) for the duration of the COVID-19 declaration under Section 564(b)(1) of the Act, 21 U.S.C. section 360bbb-3(b)(1), unless the authorization is terminated or revoked.     Resp Syncytial Virus by PCR NEGATIVE NEGATIVE Final    Comment: (NOTE) Fact Sheet for Patients: EntrepreneurPulse.com.au  Fact Sheet for Healthcare Providers: IncredibleEmployment.be  This test is not yet approved or cleared by the [place] FDA and has been authorized for detection and/or diagnosis of SARS-CoV-2 by FDA under an Emergency Use Authorization (EUA). This EUA will remain in effect (meaning this test can be used) for the duration of the COVID-19 declaration under Section 564(b)(1) of the Act, 21 U.S.C. section 360bbb-3(b)(1), unless the authorization is terminated  or revoked.  Performed at [facility], [place] [address], [place], [place] [address]      Radiology Studies: CT Angio Chest Pulmonary Embolism (PE) W or WO Contrast  Result Date: 08/26/2022 CLINICAL DATA:  High probability for PE EXAM: CT ANGIOGRAPHY CHEST WITH CONTRAST TECHNIQUE: Multidetector CT imaging of the chest was performed using the standard protocol during bolus administration of intravenous contrast. Multiplanar CT image reconstructions and MIPs were obtained to evaluate the vascular anatomy. RADIATION DOSE REDUCTION: This exam was performed according to the departmental dose-optimization program which includes automated exposure control, adjustment of the mA and/or kV according to patient size and/or use of iterative reconstruction technique. CONTRAST:  29m OMNIPAQUE IOHEXOL 350 MG/ML SOLN COMPARISON:  CT angiogram chest 08/26/2022 FINDINGS: Cardiovascular: Satisfactory opacification of the pulmonary arteries to the segmental level. No evidence of pulmonary embolism. Heart is mildly enlarged. No pericardial effusion. Mediastinum/Nodes: No enlarged mediastinal, hilar, or axillary lymph nodes. Thyroid gland, trachea, and esophagus demonstrate no significant findings. Lungs/Pleura: Scattered linear  and patchy opacities are seen in the bilateral lower lobes and lingula similar to the prior study. No pleural effusion pneumothorax identified. Upper Abdomen: No acute abnormality. Musculoskeletal: No chest wall abnormality. No acute or significant osseous findings. Review of the MIP images confirms the above findings. IMPRESSION: 1. No evidence for pulmonary embolism. 2. Mild cardiomegaly. 3. Scattered linear and patchy opacities in the lower lobes and lingula similar to the prior study. Findings may represent atelectasis or infection. Electronically Signed   By: Ronney Asters M.D.   On: 08/26/2022 18:02   CT Angio Chest PE W and/or Wo Contrast  Result Date: 08/26/2022 CLINICAL DATA:   Pulmonary embolism (PE) suspected, high prob EXAM: CT ANGIOGRAPHY CHEST WITH CONTRAST TECHNIQUE: Multidetector CT imaging of the chest was performed using the standard protocol during bolus administration of intravenous contrast. Multiplanar CT image reconstructions and MIPs were obtained to evaluate the vascular anatomy. RADIATION DOSE REDUCTION: This exam was performed according to the departmental dose-optimization program which includes automated exposure control, adjustment of the mA and/or kV according to patient size and/or use of iterative reconstruction technique. CONTRAST:  104m OMNIPAQUE IOHEXOL 350 MG/ML SOLN COMPARISON:  CT a chest 04/02/2021. FINDINGS: Cardiovascular: Suboptimal opacification of the pulmonary arteries, essentially nondiagnostic. Within this limitation, there is a questionable filling defect in a right lower lobe segmental branch (series 5, image 82). Mild cardiomegaly.No pericardial disease. Thoracic aorta is unremarkable. Mediastinum/Nodes: No lymphadenopathy. The thyroid is unremarkable. Lungs/Pleura: There is a 4 mm right upper lobe pulmonary nodule (series 6, image 48), stable since December 2020 and benign. No new suspicious pulmonary nodules. Bibasilar subsegmental atelectasis. No pleural effusion or pneumothorax. Mild lower lung predominant bronchial wall thickening. Lingular subsegmental atelectasis. No focal airspace consolidation. Upper Abdomen: Left adrenal thickening without discrete nodule. This is unchanged from prior exam. Musculoskeletal: No acute osseous abnormality. Multilevel degenerative changes spine. No suspicious osseous lesion. Review of the MIP images confirms the above findings. IMPRESSION: Suboptimal opacification of pulmonary arteries, essentially nondiagnostic. Within this limitation, there is a questionable filling defect in a right lower lobe segmental branch which could represent an embolus or artifact. Consider short-term repeat CTA of the chest or  lower extremity Doppler ultrasound. Lower lung predominant bronchial wall thickening, suggesting bronchitis. Bibasilar subsegmental atelectasis.  No airspace consolidation. Electronically Signed   By: JMaurine SimmeringM.D.   On: 08/26/2022 13:51   DG Chest Portable 1 View  Result Date: 08/26/2022 CLINICAL DATA:  Shortness of breath, cough EXAM: PORTABLE CHEST 1 VIEW COMPARISON:  Portable exam 1047 hours compared to 04/23/2021 FINDINGS: Borderline enlargement of cardiac silhouette. Mediastinal contours and pulmonary vascularity normal. Minimal bibasilar atelectasis. Lungs otherwise clear. No pulmonary infiltrate, pleural effusion, or pneumothorax. Osseous structures unremarkable. IMPRESSION: Minimal bibasilar atelectasis. Electronically Signed   By: MLavonia DanaM.D.   On: 08/26/2022 11:26    Scheduled Meds:  albuterol  2.5 mg Nebulization BID   ARIPiprazole  20 mg Oral Daily   atorvastatin  40 mg Oral Daily   doxycycline  100 mg Oral Q12H   DULoxetine  20 mg Oral BID   enoxaparin (LOVENOX) injection  60 mg Subcutaneous Q24H   fesoterodine  4 mg Oral Daily   fluticasone furoate-vilanterol  1 puff Inhalation Daily   furosemide  40 mg Intravenous BID   gabapentin  300 mg Oral TID   insulin aspart  0-20 Units Subcutaneous TID WC   insulin aspart  0-5 Units Subcutaneous QHS   insulin glargine-yfgn  15 Units Subcutaneous Daily  irbesartan  75 mg Oral Daily   metoprolol succinate  50 mg Oral Daily   nicotine  21 mg Transdermal Daily   oxyCODONE  10 mg Oral 6 X Daily   potassium chloride  10 mEq Oral BID   predniSONE  40 mg Oral Q breakfast   triamcinolone cream  1 Application Topical BID   triamterene-hydrochlorothiazide  1 tablet Oral Daily   umeclidinium bromide  1 puff Inhalation Daily   ursodiol  300 mg Oral BID   Continuous Infusions:   LOS: 1 day   Darliss Cheney, MD [facility]  08/28/2022, 11:14 AM   *Please note that this is a verbal dictation therefore any spelling or  grammatical errors are due to the "[place]" system interpretation.  Please page via [place] and do not message via secure chat for urgent patient care matters. Secure chat can be used for non urgent patient care matters.  How to contact the [facility] Attending or Consulting provider [place] or covering provider during after hours [place], for this patient?  Check the care team in [facility] and look for a) attending/consulting [facility] provider listed and b) the [facility] team listed. Page or secure chat [phone]. Log into [email or web address] and use [facility]'s universal password to access. If you do not have the password, please contact the hospital operator. Locate the [facility] provider you are looking for under [facility] and page to a number that you can be directly reached. If you still have difficulty reaching the provider, please page the [facility] (Director on Call) for the Hospitalists listed on amion for assistance.

## 2022-08-28 NOTE — Plan of Care (Signed)

## 2022-08-29 DIAGNOSIS — J441 Chronic obstructive pulmonary disease with (acute) exacerbation: Secondary | ICD-10-CM | POA: Diagnosis not present

## 2022-08-29 LAB — GLUCOSE, CAPILLARY
Glucose-Capillary: 133 mg/dL — ABNORMAL HIGH (ref 70–99)
Glucose-Capillary: 200 mg/dL — ABNORMAL HIGH (ref 70–99)

## 2022-08-29 LAB — BASIC METABOLIC PANEL
Anion gap: 11 (ref 5–15)
BUN: 39 mg/dL — ABNORMAL HIGH (ref 6–20)
CO2: 26 mmol/L (ref 22–32)
Calcium: 9.1 mg/dL (ref 8.9–10.3)
Chloride: 97 mmol/L — ABNORMAL LOW (ref 98–111)
Creatinine, Ser: 0.96 mg/dL (ref 0.44–1.00)
GFR, Estimated: >60 mL/min (ref >60)
Glucose, Bld: 113 mg/dL — ABNORMAL HIGH (ref 70–99)
Potassium: 4.4 mmol/L (ref 3.5–5.1)
Sodium: 134 mmol/L — ABNORMAL LOW (ref 135–145)

## 2022-08-29 MED ORDER — PREDNISONE 20 MG PO TABS: 40.0000 mg | ORAL_TABLET | Freq: Every day | ORAL | 0 refills | Status: AC

## 2022-08-29 NOTE — Discharge Summary (Signed)
Physician Discharge Summary  SADEEN WIEGEL EQA:[id number] DOB: September 05, 1971 DOA: 08/26/2022  PCP: [name], PA  Admit date: 08/26/2022 Discharge date: 08/29/2022 30 Day Unplanned Readmission Risk Score    Flowsheet Row ED to Hosp-Admission (Current) from 08/26/2022 in [place] [facility]  30 Day Unplanned Readmission Risk Score (%) 14.14 Filed at 08/29/2022 0800       This score is the patient's risk of an unplanned readmission within 30 days of being discharged (0 -100%). The score is based on dignosis, age, lab data, medications, orders, and past utilization.   Low:  0-14.9   Medium: 15-21.9   High: 22-29.9   Extreme: 30 and above          Admitted From: Home Disposition: Home  Recommendations for Outpatient Follow-up:  Follow up with PCP in 1-2 weeks Please obtain BMP/CBC in one week Please follow up with your PCP on the following pending results: Unresulted Labs (From admission, onward)     Start     Ordered   08/29/22 9798  Basic metabolic panel  ONCE - URGENT,   URGENT        08/29/22 0818   08/26/22 1503  Expectorated Sputum Assessment w Gram Stain, Rflx to Resp Cult  Once,   R        08/26/22 [place]: None Equipment/Devices: None  Discharge Condition: Stable CODE STATUS: Full code Diet recommendation: Cardiac  Subjective: Seen and examined.  She feels better.  No shortness of breath.  She feels comfortable going home today.  Brief/Interim Summary: 52 y.o. female with medical history significant of COPD Gold stage II, HTN, HLD, bipolar disorder, morbid obesity, chronic lymphedema, presented with worsening of cough wheezing shortness of breath and chest pain.   Chest pain: Resolved.  Cardiac enzymes negative, likely costochondritis or pleuritic, treated with ibuprofen and pain is resolved now.   Acute COPD exacerbation: Feels much better.  Still has expiratory wheezes scheduled but she is not hypoxic or  dyspneic.  Will discharge on 3 more days of oral prednisone.   Chronic lymphedema: Patient seen at she takes Lasix 40 mg p.o. twice daily at home and that she has congestive heart failure and she sees cardiologist Dr. Oval Linsey. I was able to review the recent note about 2 months ago from cardiology and they have documented chronic diastolic congestive heart failure but patient's recent echo did not show any diastolic congestive heart failure.  Per their note, there is no indication for loop diuretics so it appears that she was never prescribed but patient continued to tell us that she is taking Lasix at home.  She did have significant edema here for which she was given IV Lasix but that has not been prescribed to her at discharge.   Essential hypertension: Controlled for most part.  Resume home medications.   Type 2 diabetes mellitus: Recent hemoglobin A1c 6.4 patient takes metformin and glipizide at home.  Resume home occasions.   Diabetic neuropathy: Continue gabapentin.   Blood pressure disorder: Continue Abilify and Cymbalta.   Nicotine dependence: Lengthy discussion with the patient and I counseled her about quitting, she said she is ready and she does appear to be motivated.  Discharge plan was discussed with patient and/or family member and they verbalized understanding and agreed with it.  Discharge Diagnoses:  Principal Problem:   COPD (chronic obstructive pulmonary disease) ([place]) Active Problems:  Essential hypertension   Chest pain   OSA (obstructive sleep apnea)   Lymphedema   COPD mixed type (HCC)   COPD with acute exacerbation [facility])    Discharge Instructions   Allergies as of 08/29/2022       Reactions   Aspirin Nausea And Vomiting        Medication List     STOP taking these medications    cephALEXin 500 MG capsule Commonly known as: KEFLEX       TAKE these medications    albuterol 108 (90 Base) MCG/ACT inhaler Commonly known as: VENTOLIN  HFA Inhale 1-2 puffs into the lungs every 6 (six) hours as needed for wheezing or shortness of breath.   albuterol (2.5 MG/3ML) 0.083% nebulizer solution Commonly known as: PROVENTIL Take 2.5 mg by nebulization every 6 (six) hours as needed for wheezing or shortness of breath.   ARIPiprazole 20 MG tablet Commonly known as: ABILIFY Take 20 mg by mouth daily.   atorvastatin 40 MG tablet Commonly known as: LIPITOR Take 1 tablet (40 mg total) by mouth daily.   Breztri Aerosphere 160-9-4.8 MCG/ACT Aero Generic drug: Budeson-Glycopyrrol-Formoterol Inhale 2 puffs into the lungs in the morning and at bedtime.   DULoxetine 20 MG capsule Commonly known as: CYMBALTA Take 20 mg by mouth 2 (two) times daily.   gabapentin 300 MG capsule Commonly known as: NEURONTIN Take 300 mg by mouth 3 (three) times daily.   glipiZIDE 10 MG 24 hr tablet Commonly known as: GLUCOTROL XL Take 10 mg by mouth daily with breakfast.   metFORMIN 1000 MG tablet Commonly known as: GLUCOPHAGE Take 1,000 mg by mouth 2 (two) times daily with a meal.   methocarbamol 500 MG tablet Commonly known as: ROBAXIN Take 1 tablet (500 mg total) by mouth 3 (three) times daily as needed for muscle spasms. What changed: Another medication with the same name was removed. Continue taking this medication, and follow the directions you see here.   metoprolol succinate 50 MG 24 hr tablet Commonly known as: TOPROL-XL Take 50 mg by mouth daily.   naloxone 4 MG/0.1ML Liqd nasal spray kit Commonly known as: Narcan 1 spray every 2 minutes as needed for opioid overdose; spray 1 dose into ONE nostril; alternate nostrils w each dose until help arrives   Oxycodone HCl 10 MG Tabs Take 1 tablet (10 mg total) by mouth 6 (six) times daily as needed for pain.   Ozempic (0.25 or 0.5 MG/DOSE) 2 MG/3ML Sopn Generic drug: Semaglutide(0.25 or 0.5MG/DOS) Inject 0.5 mg into the skin once a week. Monday   potassium chloride 10 MEQ  tablet Commonly known as: KLOR-CON Take 10 mEq by mouth 2 (two) times daily.   predniSONE 20 MG tablet Commonly known as: DELTASONE Take 2 tablets (40 mg total) by mouth daily with breakfast for 3 days. Start taking on: August 30, 2022   tolterodine 4 MG 24 hr capsule Commonly known as: DETROL LA Take 4 mg by mouth daily.   Triamcinolone Acetonide 0.025 % Lotn Apply 1 Application topically 2 (two) times daily.   triamterene-hydrochlorothiazide 37.5-25 MG tablet Commonly known as: MAXZIDE-25 Take 1 tablet by mouth daily.   ursodiol 300 MG capsule Commonly known as: ACTIGALL Take 300 mg by mouth 2 (two) times daily.   valsartan 80 MG tablet Commonly known as: DIOVAN TAKE 1 TABLET(80 MG) BY MOUTH DAILY What changed: See the new instructions.   Vitamin D (Ergocalciferol) 1.25 MG (50000 UNIT) Caps capsule Commonly known as: DRISDOL Take  1 capsule (50,000 Units total) by mouth once a week. What changed: additional instructions        Follow-up Information     [name], PA Follow up in 1 week(s).   Specialty: Physician Assistant Contact information: [address] [place] [address] [phone]                Allergies  Allergen Reactions   Aspirin Nausea And Vomiting    Consultations: None   Procedures/Studies: VAS [place] LOWER EXTREMITY VENOUS (DVT) (7a-7p)  Result Date: 08/26/2022  Lower Venous DVT Study Patient Name:  Lori Bean  Date of Exam:   08/26/2022 Medical Rec #: [id number]             Accession #:    [id number] Date of Birth: Jan 06, 1971             Patient Gender: F Patient Age:   89 years Exam Location:  [facility] Procedure:      VAS [place] LOWER EXTREMITY VENOUS (DVT) Referring Phys: JOSHUA LONG --------------------------------------------------------------------------------  Indications: Swelling, Pain, and SOB.  Limitations: Body habitus and poor ultrasound/tissue interface. Comparison Study: 04/02/22 - Negative  reflux study                   06-30-21 - Negative LEV Performing Technologist: Velva Harman Sturdivant RDMS, RVT  Examination Guidelines: A complete evaluation includes B-mode imaging, spectral Doppler, color Doppler, and power Doppler as needed of all accessible portions of each vessel. Bilateral testing is considered an integral part of a complete examination. Limited examinations for reoccurring indications may be performed as noted. The reflux portion of the exam is performed with the patient in reverse Trendelenburg.  +-----+---------------+---------+-----------+----------+--------------+ RIGHTCompressibilityPhasicitySpontaneityPropertiesThrombus Aging +-----+---------------+---------+-----------+----------+--------------+ CFV  Full           Yes      Yes                                 +-----+---------------+---------+-----------+----------+--------------+ SFJ  Full                                                        +-----+---------------+---------+-----------+----------+--------------+   +---------+---------------+---------+-----------+----------+-------------------+ LEFT     CompressibilityPhasicitySpontaneityPropertiesThrombus Aging      +---------+---------------+---------+-----------+----------+-------------------+ CFV      Full           Yes      Yes                                      +---------+---------------+---------+-----------+----------+-------------------+ SFJ      Full                                                             +---------+---------------+---------+-----------+----------+-------------------+ FV Prox  Full                                                             +---------+---------------+---------+-----------+----------+-------------------+  FV Mid   Full                                                             +---------+---------------+---------+-----------+----------+-------------------+ FV DistalFull                                                              +---------+---------------+---------+-----------+----------+-------------------+ PFV      Full                                                             +---------+---------------+---------+-----------+----------+-------------------+ POP      Full           Yes      Yes                                      +---------+---------------+---------+-----------+----------+-------------------+ PTV      Full                                                             +---------+---------------+---------+-----------+----------+-------------------+ PERO     Full                                         Not well                                                                  visualized, appears                                                       patent              +---------+---------------+---------+-----------+----------+-------------------+     Summary: RIGHT: - No evidence of common femoral vein obstruction.  LEFT: - There is no evidence of deep vein thrombosis in the lower extremity.  *See table(s) above for measurements and observations. Electronically signed by Orlie Pollen on 08/26/2022 at 8:21:19 PM.    Final    CT Angio Chest Pulmonary Embolism (PE) W or WO Contrast  Result Date: 08/26/2022 CLINICAL DATA:  High probability for PE EXAM: CT ANGIOGRAPHY CHEST WITH CONTRAST TECHNIQUE: Multidetector CT imaging of the chest  was performed using the standard protocol during bolus administration of intravenous contrast. Multiplanar CT image reconstructions and MIPs were obtained to evaluate the vascular anatomy. RADIATION DOSE REDUCTION: This exam was performed according to the departmental dose-optimization program which includes automated exposure control, adjustment of the mA and/or kV according to patient size and/or use of iterative reconstruction technique. CONTRAST:  94m OMNIPAQUE IOHEXOL 350 MG/ML SOLN COMPARISON:   CT angiogram chest 08/26/2022 FINDINGS: Cardiovascular: Satisfactory opacification of the pulmonary arteries to the segmental level. No evidence of pulmonary embolism. Heart is mildly enlarged. No pericardial effusion. Mediastinum/Nodes: No enlarged mediastinal, hilar, or axillary lymph nodes. Thyroid gland, trachea, and esophagus demonstrate no significant findings. Lungs/Pleura: Scattered linear and patchy opacities are seen in the bilateral lower lobes and lingula similar to the prior study. No pleural effusion pneumothorax identified. Upper Abdomen: No acute abnormality. Musculoskeletal: No chest wall abnormality. No acute or significant osseous findings. Review of the MIP images confirms the above findings. IMPRESSION: 1. No evidence for pulmonary embolism. 2. Mild cardiomegaly. 3. Scattered linear and patchy opacities in the lower lobes and lingula similar to the prior study. Findings may represent atelectasis or infection. Electronically Signed   By: ARonney AstersM.D.   On: 08/26/2022 18:02   CT Angio Chest PE W and/or Wo Contrast  Result Date: 08/26/2022 CLINICAL DATA:  Pulmonary embolism (PE) suspected, high prob EXAM: CT ANGIOGRAPHY CHEST WITH CONTRAST TECHNIQUE: Multidetector CT imaging of the chest was performed using the standard protocol during bolus administration of intravenous contrast. Multiplanar CT image reconstructions and MIPs were obtained to evaluate the vascular anatomy. RADIATION DOSE REDUCTION: This exam was performed according to the departmental dose-optimization program which includes automated exposure control, adjustment of the mA and/or kV according to patient size and/or use of iterative reconstruction technique. CONTRAST:  777mOMNIPAQUE IOHEXOL 350 MG/ML SOLN COMPARISON:  CT a chest 04/02/2021. FINDINGS: Cardiovascular: Suboptimal opacification of the pulmonary arteries, essentially nondiagnostic. Within this limitation, there is a questionable filling defect in a right  lower lobe segmental branch (series 5, image 82). Mild cardiomegaly.No pericardial disease. Thoracic aorta is unremarkable. Mediastinum/Nodes: No lymphadenopathy. The thyroid is unremarkable. Lungs/Pleura: There is a 4 mm right upper lobe pulmonary nodule (series 6, image 48), stable since December 2020 and benign. No new suspicious pulmonary nodules. Bibasilar subsegmental atelectasis. No pleural effusion or pneumothorax. Mild lower lung predominant bronchial wall thickening. Lingular subsegmental atelectasis. No focal airspace consolidation. Upper Abdomen: Left adrenal thickening without discrete nodule. This is unchanged from prior exam. Musculoskeletal: No acute osseous abnormality. Multilevel degenerative changes spine. No suspicious osseous lesion. Review of the MIP images confirms the above findings. IMPRESSION: Suboptimal opacification of pulmonary arteries, essentially nondiagnostic. Within this limitation, there is a questionable filling defect in a right lower lobe segmental branch which could represent an embolus or artifact. Consider short-term repeat CTA of the chest or lower extremity Doppler ultrasound. Lower lung predominant bronchial wall thickening, suggesting bronchitis. Bibasilar subsegmental atelectasis.  No airspace consolidation. Electronically Signed   By: JaMaurine Simmering.D.   On: 08/26/2022 13:51   DG Chest Portable 1 View  Result Date: 08/26/2022 CLINICAL DATA:  Shortness of breath, cough EXAM: PORTABLE CHEST 1 VIEW COMPARISON:  Portable exam 1047 hours compared to 04/23/2021 FINDINGS: Borderline enlargement of cardiac silhouette. Mediastinal contours and pulmonary vascularity normal. Minimal bibasilar atelectasis. Lungs otherwise clear. No pulmonary infiltrate, pleural effusion, or pneumothorax. Osseous structures unremarkable. IMPRESSION: Minimal bibasilar atelectasis. Electronically Signed   By: MaLavonia Dana.D.   On:  08/26/2022 11:26     Discharge Exam: Vitals:   08/29/22 0222  08/29/22 0839  BP: 105/69 126/79  Pulse: 73 65  Resp: 18 16  Temp: 97.7 F (36.5 C) (!) 97.5 F (36.4 C)  SpO2: 96% 92%   Vitals:   08/28/22 1613 08/28/22 1946 08/29/22 0222 08/29/22 0839  BP: 132/75 125/72 105/69 126/79  Pulse: 73 73 73 65  Resp: _0 Temp: 98.5 F (36.9 C) 98.1 F (36.7 C) 97.7 F (36.5 C) (!) 97.5 F (36.4 C)  TempSrc: Oral Oral Oral Oral  SpO2: 94% 98% 96% 92%  Weight:      Height:        General: Pt is alert, awake, not in acute distress Cardiovascular: RRR, S1/S2 +, no rubs, no gallops Respiratory: Scattered mild expiratory wheezes Abdominal: Soft, NT, ND, bowel sounds + Extremities: no edema, no cyanosis    The results of significant diagnostics from this hospitalization (including imaging, microbiology, ancillary and laboratory) are listed below for reference.     Microbiology: Recent Results (from the past 240 hour(s))  Resp panel by RT-PCR (RSV, Flu A&B, Covid) Anterior Nasal Swab     Status: None   Collection Time: 08/26/22 10:31 AM   Specimen: Anterior Nasal Swab  Result Value Ref Range Status   SARS Coronavirus 2 by RT PCR NEGATIVE NEGATIVE Final    Comment: (NOTE) SARS-CoV-2 target nucleic acids are NOT DETECTED.  The SARS-CoV-2 RNA is generally detectable in upper respiratory specimens during the acute phase of infection. The lowest concentration of SARS-CoV-2 viral copies this assay can detect is 138 copies/mL. A negative result does not preclude SARS-Cov-2 infection and should not be used as the sole basis for treatment or other patient management decisions. A negative result may occur with  improper specimen collection/handling, submission of specimen other than nasopharyngeal swab, presence of viral mutation(s) within the areas targeted by this assay, and inadequate number of viral copies(<138 copies/mL). A negative result must be combined with clinical observations, patient history, and epidemiological information.  The expected result is Negative.  Fact Sheet for Patients:  EntrepreneurPulse.com.au  Fact Sheet for Healthcare Providers:  IncredibleEmployment.be  This test is no t yet approved or cleared by the [place] FDA and  has been authorized for detection and/or diagnosis of SARS-CoV-2 by FDA under an Emergency Use Authorization (EUA). This EUA will remain  in effect (meaning this test can be used) for the duration of the COVID-19 declaration under Section 564(b)(1) of the Act, 21 U.S.C.section 360bbb-3(b)(1), unless the authorization is terminated  or revoked sooner.       Influenza A by PCR NEGATIVE NEGATIVE Final   Influenza B by PCR NEGATIVE NEGATIVE Final    Comment: (NOTE) The Xpert Xpress SARS-CoV-2/FLU/RSV plus assay is intended as an aid in the diagnosis of influenza from Nasopharyngeal swab specimens and should not be used as a sole basis for treatment. Nasal washings and aspirates are unacceptable for Xpert Xpress SARS-CoV-2/FLU/RSV testing.  Fact Sheet for Patients: EntrepreneurPulse.com.au  Fact Sheet for Healthcare Providers: IncredibleEmployment.be  This test is not yet approved or cleared by the [place] FDA and has been authorized for detection and/or diagnosis of SARS-CoV-2 by FDA under an Emergency Use Authorization (EUA). This EUA will remain in effect (meaning this test can be used) for the duration of the COVID-19 declaration under Section 564(b)(1) of the Act, 21 U.S.C. section 360bbb-3(b)(1), unless the authorization is terminated or revoked.     Resp  Syncytial Virus by PCR NEGATIVE NEGATIVE Final    Comment: (NOTE) Fact Sheet for Patients: EntrepreneurPulse.com.au  Fact Sheet for Healthcare Providers: IncredibleEmployment.be  This test is not yet approved or cleared by the [place] FDA and has been authorized for detection and/or  diagnosis of SARS-CoV-2 by FDA under an Emergency Use Authorization (EUA). This EUA will remain in effect (meaning this test can be used) for the duration of the COVID-19 declaration under Section 564(b)(1) of the Act, 21 U.S.C. section 360bbb-3(b)(1), unless the authorization is terminated or revoked.  Performed at [facility], [place] [address], [place], [place] [address]      Labs: BNP (last 3 results) Recent Labs    08/26/22 1043  BNP 64.1   Basic Metabolic Panel: Recent Labs  Lab 08/26/22 1042 08/28/22 0558  NA 139 134*  K 4.0 4.6  CL 103 96*  CO2 26 25  GLUCOSE 108* 232*  BUN 8 30*  CREATININE 0.67 0.96  CALCIUM 9.7 9.7   Liver Function Tests: Recent Labs  Lab 08/26/22 1042 08/28/22 0558  AST 14* 16  ALT 26 21  ALKPHOS 88 79  BILITOT 0.7 0.5  PROT 7.3 6.9  ALBUMIN 3.8 3.4*   Recent Labs  Lab 08/26/22 1042  LIPASE 32   No results for input(s): "AMMONIA" in the last 168 hours. CBC: Recent Labs  Lab 08/26/22 1042 08/28/22 0558  WBC 9.1 13.4*  NEUTROABS 6.0  --   HGB 14.5 15.4*  HCT 44.5 44.6  MCV 95.1 92.5  PLT 211 228   Cardiac Enzymes: No results for input(s): "CKTOTAL", "CKMB", "CKMBINDEX", "TROPONINI" in the last 168 hours. BNP: Invalid input(s): "POCBNP" CBG: Recent Labs  Lab 08/28/22 0947 08/28/22 1136 08/28/22 1648 08/28/22 2031 08/29/22 0836  GLUCAP 184* 204* 243* 196* 133*   D-Dimer No results for input(s): "DDIMER" in the last 72 hours. Hgb A1c Recent Labs    08/26/22 1530  HGBA1C 6.4*   Lipid Profile No results for input(s): "CHOL", "HDL", "LDLCALC", "TRIG", "CHOLHDL", "LDLDIRECT" in the last 72 hours. Thyroid function studies No results for input(s): "TSH", "T4TOTAL", "T3FREE", "THYROIDAB" in the last 72 hours.  Invalid input(s): "FREET3" Anemia work up No results for input(s): "VITAMINB12", "FOLATE", "FERRITIN", "TIBC", "IRON", "RETICCTPCT" in the last 72 hours. Urinalysis    Component Value Date/Time    COLORURINE YELLOW 03/28/2021 1807   APPEARANCEUR CLOUDY (A) 03/28/2021 1807   LABSPEC 1.032 (H) 03/28/2021 1807   PHURINE 5.0 03/28/2021 1807   GLUCOSEU NEGATIVE 03/28/2021 1807   HGBUR MODERATE (A) 03/28/2021 1807   BILIRUBINUR NEGATIVE 03/28/2021 1807   BILIRUBINUR neg 06/19/2015 1142   KETONESUR NEGATIVE 03/28/2021 1807   PROTEINUR 100 (A) 03/28/2021 1807   UROBILINOGEN 0.2 06/19/2015 1142   UROBILINOGEN 0.2 08/24/2013 1416   NITRITE NEGATIVE 03/28/2021 1807   LEUKOCYTESUR NEGATIVE 03/28/2021 1807   Sepsis Labs Recent Labs  Lab 08/26/22 1042 08/28/22 0558  WBC 9.1 13.4*   Microbiology Recent Results (from the past 240 hour(s))  Resp panel by RT-PCR (RSV, Flu A&B, Covid) Anterior Nasal Swab     Status: None   Collection Time: 08/26/22 10:31 AM   Specimen: Anterior Nasal Swab  Result Value Ref Range Status   SARS Coronavirus 2 by RT PCR NEGATIVE NEGATIVE Final    Comment: (NOTE) SARS-CoV-2 target nucleic acids are NOT DETECTED.  The SARS-CoV-2 RNA is generally detectable in upper respiratory specimens during the acute phase of infection. The lowest concentration of SARS-CoV-2 viral copies this assay  can detect is 138 copies/mL. A negative result does not preclude SARS-Cov-2 infection and should not be used as the sole basis for treatment or other patient management decisions. A negative result may occur with  improper specimen collection/handling, submission of specimen other than nasopharyngeal swab, presence of viral mutation(s) within the areas targeted by this assay, and inadequate number of viral copies(<138 copies/mL). A negative result must be combined with clinical observations, patient history, and epidemiological information. The expected result is Negative.  Fact Sheet for Patients:  EntrepreneurPulse.com.au  Fact Sheet for Healthcare Providers:  IncredibleEmployment.be  This test is no t yet approved or cleared by  the [place] FDA and  has been authorized for detection and/or diagnosis of SARS-CoV-2 by FDA under an Emergency Use Authorization (EUA). This EUA will remain  in effect (meaning this test can be used) for the duration of the COVID-19 declaration under Section 564(b)(1) of the Act, 21 U.S.C.section 360bbb-3(b)(1), unless the authorization is terminated  or revoked sooner.       Influenza A by PCR NEGATIVE NEGATIVE Final   Influenza B by PCR NEGATIVE NEGATIVE Final    Comment: (NOTE) The Xpert Xpress SARS-CoV-2/FLU/RSV plus assay is intended as an aid in the diagnosis of influenza from Nasopharyngeal swab specimens and should not be used as a sole basis for treatment. Nasal washings and aspirates are unacceptable for Xpert Xpress SARS-CoV-2/FLU/RSV testing.  Fact Sheet for Patients: EntrepreneurPulse.com.au  Fact Sheet for Healthcare Providers: IncredibleEmployment.be  This test is not yet approved or cleared by the [place] FDA and has been authorized for detection and/or diagnosis of SARS-CoV-2 by FDA under an Emergency Use Authorization (EUA). This EUA will remain in effect (meaning this test can be used) for the duration of the COVID-19 declaration under Section 564(b)(1) of the Act, 21 U.S.C. section 360bbb-3(b)(1), unless the authorization is terminated or revoked.     Resp Syncytial Virus by PCR NEGATIVE NEGATIVE Final    Comment: (NOTE) Fact Sheet for Patients: EntrepreneurPulse.com.au  Fact Sheet for Healthcare Providers: IncredibleEmployment.be  This test is not yet approved or cleared by the [place] FDA and has been authorized for detection and/or diagnosis of SARS-CoV-2 by FDA under an Emergency Use Authorization (EUA). This EUA will remain in effect (meaning this test can be used) for the duration of the COVID-19 declaration under Section 564(b)(1) of the Act, 21  U.S.C. section 360bbb-3(b)(1), unless the authorization is terminated or revoked.  Performed at [facility], [place] [address], [place], [place] [address]      Time coordinating discharge: Over 30 minutes  SIGNED:   Darliss Cheney, MD  [facility] 08/29/2022, 10:02 AM *Please note that this is a verbal dictation therefore any spelling or grammatical errors are due to the "[place]" system interpretation. If 7PM-7AM, please contact night-coverage [email or web address]

## 2022-09-01 ENCOUNTER — Other Ambulatory Visit (HOSPITAL_COMMUNITY): Payer: Self-pay

## 2022-09-01 MED ORDER — METHOCARBAMOL 500 MG PO TABS
500.0000 mg | ORAL_TABLET | Freq: Three times a day (TID) | ORAL | 1 refills | Status: DC | PRN
  Filled 2022-09-01 (×2): qty 90, 30d supply, fill #0

## 2022-09-01 MED ORDER — OXYCODONE HCL 10 MG PO TABS
10.0000 mg | ORAL_TABLET | ORAL | 0 refills | Status: DC
  Filled 2022-09-01: qty 180, 30d supply, fill #0

## 2022-09-18 ENCOUNTER — Other Ambulatory Visit (HOSPITAL_COMMUNITY): Payer: Self-pay

## 2022-09-22 ENCOUNTER — Encounter (HOSPITAL_BASED_OUTPATIENT_CLINIC_OR_DEPARTMENT_OTHER): Payer: Self-pay | Admitting: General Surgery

## 2022-09-23 ENCOUNTER — Encounter (HOSPITAL_BASED_OUTPATIENT_CLINIC_OR_DEPARTMENT_OTHER): Payer: Self-pay | Admitting: General Surgery

## 2022-09-23 NOTE — Progress Notes (Addendum)
Addendum:  Reviewed chart w/ anesthesia, Dr R. Fitzgerald MDA.  Due multiple medical health issues and pt did ozempic (glp-1 agonist) on Monday 09-21-2022 and surgery 09-25-2022.  Dr R. Ola Spurr stated he will back with answer if pt needed to be rescheduled. Per Dr Alfonso Patten. Fitzgerald MDA stated pt will be evaluated dos , could possibly be cancelled since pt did her ozempic.   Spoke w/ via phone for pre-op interview--- pt Lab needs dos----  [employer] results------ current EKG in epic/ chart COVID test -----patient states asymptomatic no test needed Arrive at -------  0645 on 09-25-2022 NPO after MN NO Solid Food.  Clear liquids from MN until--- 0545 Med rec completed Medications to take morning of surgery ----- gabapentin, cymbalta, lipitor, abilify, toprol, urosodial, detrol, breztri inhaler Diabetic medication -----  do not take metformin / glipizide morning of surgery Patient instructed no nail polish to be worn day of surgery Patient instructed to bring photo id and insurance card day of surgery Patient aware to have Driver (ride ) / caregiver    for 24 hours after surgery -- daughter, rovericka Patient Special Instructions -----  pt does ozempic on Monday's ,  stated last dose 09-21-2022.  Pt stated she was not given any instructions from Dr Marcello Moores about not doing Monday prior to surgery Pt will do alb nebulizer tonight and night before surgery as prevention, asked to bring rescue inhaler dos Pre-Op special Istructions -----  anesthesia will be consulted about ozempic. Pt had first stage sacral nerve stimulator done Dr Marcello Moores 09/ 2023.  Patient verbalized understanding of instructions that were given at this phone interview. Patient denies shortness of breath, chest pain, fever, cough at this phone interview.   Anesthesia Review:  HTN;  chronic diastolic CHF;  COPD mix type (hx multiple admission for exacerbation's w/ respiratory failure, last one in epic 08-26-2022, pt stated all  symptoms resolved);  OSA pt stated uses bipap nightly;  early state primary biliary cholangitis;  BLE lymphedema;  Schizophrenia/ bipolar affective disorder  PCP:  Raelyn Number PA [facility]) Cardiologist :  Dr Berneice Gandy (lov 06-26-2022 ) Pulmonology:  [name]  (lov 10-27-2021) GI doctor with [facility] Chest x-ray :  08-26-2022/  CTA  EKG : 08-27-2022 Echo : 03-29-2021 Stress test: 05-26-2015 Cardiac Cath :  no Activity level:  denies sob w/ normal activity but with asthma / copd gets winded with stairs/ inclines Sleep Study/ CPAP :  Yes/ Yes Fasting Blood Sugar : 89--140     / Checks Blood Sugar -- times a day:  TID Blood Thinner/ Instructions /Last Dose:no ASA / Instructions/ Last Dose : no

## 2022-09-24 NOTE — Anesthesia Preprocedure Evaluation (Addendum)
Anesthesia Evaluation  Patient identified by MRN, date of birth, ID band Patient awake    Reviewed: Allergy & Precautions, NPO status , Patient's Chart, lab work & pertinent test results, reviewed documented beta blocker date and time   History of Anesthesia Complications Negative for: history of anesthetic complications  Airway Mallampati: III  TM Distance: >3 FB Neck ROM: Full    Dental  (+) Dental Advisory Given   Pulmonary asthma , sleep apnea and Continuous Positive Airway Pressure Ventilation , COPD,  COPD inhaler, Current SmokerPatient did not abstain from smoking.   Pulmonary exam normal        Cardiovascular hypertension, Pt. on medications and Pt. on home beta blockers + DVT  (-) CHF (normal echo 2023) Normal cardiovascular exam     Neuro/Psych  PSYCHIATRIC DISORDERS Anxiety Depression Bipolar Disorder Schizophrenia  negative neurological ROS     GI/Hepatic ,GERD  Controlled,,(+)     substance abuse  cocaine use and marijuana use  Endo/Other  diabetes, Type 2, Oral Hypoglycemic Agents  Morbid obesity  Renal/GU negative Renal ROS Bladder dysfunction      Musculoskeletal  (+) Arthritis ,  narcotic dependent  Abdominal  (+) + obese  Peds  Hematology negative hematology ROS (+)   Anesthesia Other Findings Chronic pain GLP-1a, last taken 4 days ago Noncompliance   Reproductive/Obstetrics                             Anesthesia Physical Anesthesia Plan  ASA: 3  Anesthesia Plan: General   Post-op Pain Management: Tylenol PO (pre-op)*   Induction: Rapid sequence and Intravenous  PONV Risk Score and Plan: 2 and Treatment may vary due to age or medical condition, Ondansetron, Dexamethasone and Midazolam  Airway Management Planned: Oral ETT  Additional Equipment: None  Intra-op Plan:   Post-operative Plan: Extubation in OR  Informed Consent: I have reviewed the patients  History and Physical, chart, labs and discussed the procedure including the risks, benefits and alternatives for the proposed anesthesia with the patient or authorized representative who has indicated his/her understanding and acceptance.     Dental advisory given  Plan Discussed with: CRNA, Anesthesiologist and Surgeon  Anesthesia Plan Comments:        Anesthesia Quick Evaluation

## 2022-09-25 ENCOUNTER — Encounter (HOSPITAL_BASED_OUTPATIENT_CLINIC_OR_DEPARTMENT_OTHER): Payer: Self-pay | Admitting: General Surgery

## 2022-09-25 ENCOUNTER — Encounter (HOSPITAL_BASED_OUTPATIENT_CLINIC_OR_DEPARTMENT_OTHER)
Admission: RE | Disposition: A | Discharge status: Home / Self Care | Payer: Self-pay | Source: Home / Self Care | Attending: General Surgery

## 2022-09-25 ENCOUNTER — Other Ambulatory Visit: Payer: Self-pay

## 2022-09-25 ENCOUNTER — Ambulatory Visit (HOSPITAL_BASED_OUTPATIENT_CLINIC_OR_DEPARTMENT_OTHER)
Admission: RE | Admit: 2022-09-25 | Discharge: 2022-09-25 | Disposition: A | Discharge status: Home / Self Care | Payer: Medicaid Other | Attending: General Surgery | Admitting: General Surgery

## 2022-09-25 ENCOUNTER — Ambulatory Visit (HOSPITAL_COMMUNITY): Payer: Medicaid Other

## 2022-09-25 ENCOUNTER — Ambulatory Visit (HOSPITAL_BASED_OUTPATIENT_CLINIC_OR_DEPARTMENT_OTHER): Payer: Medicaid Other | Admitting: Anesthesiology

## 2022-09-25 DIAGNOSIS — G4730 Sleep apnea, unspecified: Secondary | ICD-10-CM | POA: Diagnosis not present

## 2022-09-25 DIAGNOSIS — I110 Hypertensive heart disease with heart failure: Secondary | ICD-10-CM | POA: Insufficient documentation

## 2022-09-25 DIAGNOSIS — M1990 Unspecified osteoarthritis, unspecified site: Secondary | ICD-10-CM | POA: Diagnosis not present

## 2022-09-25 DIAGNOSIS — Z7985 Long-term (current) use of injectable non-insulin antidiabetic drugs: Secondary | ICD-10-CM | POA: Insufficient documentation

## 2022-09-25 DIAGNOSIS — I509 Heart failure, unspecified: Secondary | ICD-10-CM | POA: Diagnosis not present

## 2022-09-25 DIAGNOSIS — K219 Gastro-esophageal reflux disease without esophagitis: Secondary | ICD-10-CM | POA: Diagnosis not present

## 2022-09-25 DIAGNOSIS — J449 Chronic obstructive pulmonary disease, unspecified: Secondary | ICD-10-CM | POA: Insufficient documentation

## 2022-09-25 DIAGNOSIS — F1120 Opioid dependence, uncomplicated: Secondary | ICD-10-CM | POA: Diagnosis not present

## 2022-09-25 DIAGNOSIS — R159 Full incontinence of feces: Secondary | ICD-10-CM

## 2022-09-25 DIAGNOSIS — E119 Type 2 diabetes mellitus without complications: Secondary | ICD-10-CM | POA: Insufficient documentation

## 2022-09-25 DIAGNOSIS — Z86718 Personal history of other venous thrombosis and embolism: Secondary | ICD-10-CM | POA: Diagnosis not present

## 2022-09-25 DIAGNOSIS — F419 Anxiety disorder, unspecified: Secondary | ICD-10-CM | POA: Insufficient documentation

## 2022-09-25 DIAGNOSIS — Z79899 Other long term (current) drug therapy: Secondary | ICD-10-CM | POA: Insufficient documentation

## 2022-09-25 DIAGNOSIS — Z7984 Long term (current) use of oral hypoglycemic drugs: Secondary | ICD-10-CM | POA: Diagnosis not present

## 2022-09-25 DIAGNOSIS — Z6841 Body Mass Index (BMI) 40.0 and over, adult: Secondary | ICD-10-CM | POA: Insufficient documentation

## 2022-09-25 DIAGNOSIS — I10 Essential (primary) hypertension: Secondary | ICD-10-CM

## 2022-09-25 DIAGNOSIS — Z01818 Encounter for other preprocedural examination: Secondary | ICD-10-CM

## 2022-09-25 DIAGNOSIS — F17210 Nicotine dependence, cigarettes, uncomplicated: Secondary | ICD-10-CM | POA: Diagnosis not present

## 2022-09-25 HISTORY — DX: Obstructive sleep apnea (adult) (pediatric): G47.33

## 2022-09-25 HISTORY — DX: Other psychoactive substance abuse, in remission: F19.11

## 2022-09-25 HISTORY — DX: Generalized anxiety disorder: F41.1

## 2022-09-25 HISTORY — DX: Personal history of other venous thrombosis and embolism: Z86.718

## 2022-09-25 HISTORY — DX: Full incontinence of feces: R15.9

## 2022-09-25 HISTORY — DX: Personal history of pulmonary embolism: Z86.711

## 2022-09-25 HISTORY — DX: Mixed hyperlipidemia: E78.2

## 2022-09-25 HISTORY — DX: Full incontinence of feces: R15.2

## 2022-09-25 HISTORY — DX: Chronic obstructive pulmonary disease, unspecified: J44.9

## 2022-09-25 HISTORY — DX: Personal history of other diseases of the respiratory system: Z87.09

## 2022-09-25 HISTORY — PX: OTHER SURGICAL HISTORY: SHX169

## 2022-09-25 HISTORY — DX: Type 2 diabetes mellitus without complications: E11.9

## 2022-09-25 HISTORY — DX: Mastodynia: N64.4

## 2022-09-25 HISTORY — DX: Chronic pain syndrome: G89.4

## 2022-09-25 HISTORY — DX: Unspecified osteoarthritis, unspecified site: M19.90

## 2022-09-25 HISTORY — DX: Fatty (change of) liver, not elsewhere classified: K76.0

## 2022-09-25 LAB — GLUCOSE, CAPILLARY
Glucose-Capillary: 125 mg/dL — ABNORMAL HIGH (ref 70–99)
Glucose-Capillary: 116 mg/dL — ABNORMAL HIGH (ref 70–99)

## 2022-09-25 SURGERY — INSERTION, NEUROSTIMULATOR, SACRAL
Anesthesia: General | Site: Back | Laterality: Right

## 2022-09-25 MED ORDER — FENTANYL CITRATE (PF) 100 MCG/2ML IJ SOLN
INTRAMUSCULAR | Status: DC | PRN
  Administered 2022-09-25 (×3): 50 ug via INTRAVENOUS
  Administered 2022-09-25 (×2): 25 ug via INTRAVENOUS

## 2022-09-25 MED ORDER — TRAMADOL HCL 50 MG PO TABS: 50.0000 mg | ORAL_TABLET | Freq: Four times a day (QID) | ORAL | 0 refills | Status: DC | PRN

## 2022-09-25 MED ORDER — OXYCODONE HCL 5 MG PO TABS
5.0000 mg | ORAL_TABLET | Freq: Once | ORAL | Status: AC | PRN
  Administered 2022-09-25: 5 mg via ORAL

## 2022-09-25 MED ORDER — OXYCODONE HCL 5 MG PO TABS
ORAL_TABLET | ORAL | Status: AC
  Filled 2022-09-25: qty 1

## 2022-09-25 MED ORDER — PROPOFOL 10 MG/ML IV BOLUS
INTRAVENOUS | Status: DC | PRN
  Administered 2022-09-25: 200 mg via INTRAVENOUS

## 2022-09-25 MED ORDER — FENTANYL CITRATE (PF) 100 MCG/2ML IJ SOLN
25.0000 ug | INTRAMUSCULAR | Status: DC | PRN
  Administered 2022-09-25 (×3): 50 ug via INTRAVENOUS

## 2022-09-25 MED ORDER — PROPOFOL 10 MG/ML IV BOLUS
INTRAVENOUS | Status: AC
  Filled 2022-09-25: qty 20

## 2022-09-25 MED ORDER — FENTANYL CITRATE (PF) 100 MCG/2ML IJ SOLN
INTRAMUSCULAR | Status: AC
  Filled 2022-09-25: qty 2

## 2022-09-25 MED ORDER — SUCCINYLCHOLINE CHLORIDE 200 MG/10ML IV SOSY
PREFILLED_SYRINGE | INTRAVENOUS | Status: AC
  Filled 2022-09-25: qty 10

## 2022-09-25 MED ORDER — PROMETHAZINE HCL 25 MG/ML IJ SOLN: 6.2500 mg | INTRAMUSCULAR | Status: DC | PRN

## 2022-09-25 MED ORDER — CEFAZOLIN SODIUM-DEXTROSE 2-4 GM/100ML-% IV SOLN
INTRAVENOUS | Status: AC
  Filled 2022-09-25: qty 100

## 2022-09-25 MED ORDER — MIDAZOLAM HCL 2 MG/2ML IJ SOLN
INTRAMUSCULAR | Status: AC
  Filled 2022-09-25: qty 2

## 2022-09-25 MED ORDER — ACETAMINOPHEN 500 MG PO TABS
ORAL_TABLET | ORAL | Status: AC
  Filled 2022-09-25: qty 2

## 2022-09-25 MED ORDER — DEXAMETHASONE SODIUM PHOSPHATE 10 MG/ML IJ SOLN
INTRAMUSCULAR | Status: DC | PRN
  Administered 2022-09-25: 10 mg via INTRAVENOUS

## 2022-09-25 MED ORDER — ROCURONIUM BROMIDE 10 MG/ML (PF) SYRINGE
PREFILLED_SYRINGE | INTRAVENOUS | Status: DC | PRN
  Administered 2022-09-25 (×2): 20 mg via INTRAVENOUS
  Administered 2022-09-25: 10 mg via INTRAVENOUS
  Administered 2022-09-25: 20 mg via INTRAVENOUS

## 2022-09-25 MED ORDER — CEFAZOLIN SODIUM-DEXTROSE 2-4 GM/100ML-% IV SOLN
2.0000 g | INTRAVENOUS | Status: AC
  Administered 2022-09-25: 2 g via INTRAVENOUS

## 2022-09-25 MED ORDER — HYDROMORPHONE HCL 1 MG/ML IJ SOLN
0.2500 mg | INTRAMUSCULAR | Status: DC | PRN
  Administered 2022-09-25 (×2): 0.5 mg via INTRAVENOUS

## 2022-09-25 MED ORDER — MIDAZOLAM HCL 5 MG/5ML IJ SOLN
INTRAMUSCULAR | Status: DC | PRN
  Administered 2022-09-25: 2 mg via INTRAVENOUS

## 2022-09-25 MED ORDER — ONDANSETRON HCL 4 MG/2ML IJ SOLN
INTRAMUSCULAR | Status: AC
  Filled 2022-09-25: qty 2

## 2022-09-25 MED ORDER — LACTATED RINGERS IV SOLN: INTRAVENOUS | Status: DC

## 2022-09-25 MED ORDER — SUCCINYLCHOLINE CHLORIDE 200 MG/10ML IV SOSY
PREFILLED_SYRINGE | INTRAVENOUS | Status: DC | PRN
  Administered 2022-09-25: 140 mg via INTRAVENOUS

## 2022-09-25 MED ORDER — OXYCODONE HCL 5 MG/5ML PO SOLN: 5.0000 mg | Freq: Once | ORAL | Status: AC | PRN

## 2022-09-25 MED ORDER — LIDOCAINE HCL (PF) 2 % IJ SOLN
INTRAMUSCULAR | Status: AC
  Filled 2022-09-25: qty 5

## 2022-09-25 MED ORDER — BUPIVACAINE-EPINEPHRINE 0.5% -1:200000 IJ SOLN
INTRAMUSCULAR | Status: DC | PRN
  Administered 2022-09-25: 7 mL

## 2022-09-25 MED ORDER — STERILE WATER FOR IRRIGATION IR SOLN
Status: DC | PRN
  Administered 2022-09-25: 500 mL

## 2022-09-25 MED ORDER — PHENYLEPHRINE 80 MCG/ML (10ML) SYRINGE FOR IV PUSH (FOR BLOOD PRESSURE SUPPORT)
PREFILLED_SYRINGE | INTRAVENOUS | Status: DC | PRN
  Administered 2022-09-25: 80 ug via INTRAVENOUS

## 2022-09-25 MED ORDER — SUGAMMADEX SODIUM 200 MG/2ML IV SOLN
INTRAVENOUS | Status: DC | PRN
  Administered 2022-09-25: 50 mg via INTRAVENOUS
  Administered 2022-09-25: 150 mg via INTRAVENOUS

## 2022-09-25 MED ORDER — HYDROMORPHONE HCL 1 MG/ML IJ SOLN
INTRAMUSCULAR | Status: AC
  Filled 2022-09-25: qty 1

## 2022-09-25 MED ORDER — LIDOCAINE 2% (20 MG/ML) 5 ML SYRINGE
INTRAMUSCULAR | Status: DC | PRN
  Administered 2022-09-25: 60 mg via INTRAVENOUS

## 2022-09-25 MED ORDER — ROCURONIUM BROMIDE 10 MG/ML (PF) SYRINGE
PREFILLED_SYRINGE | INTRAVENOUS | Status: AC
  Filled 2022-09-25: qty 10

## 2022-09-25 MED ORDER — DEXAMETHASONE SODIUM PHOSPHATE 10 MG/ML IJ SOLN
INTRAMUSCULAR | Status: AC
  Filled 2022-09-25: qty 1

## 2022-09-25 MED ORDER — ONDANSETRON HCL 4 MG/2ML IJ SOLN
INTRAMUSCULAR | Status: DC | PRN
  Administered 2022-09-25: 4 mg via INTRAVENOUS

## 2022-09-25 MED ORDER — ACETAMINOPHEN 500 MG PO TABS
1000.0000 mg | ORAL_TABLET | ORAL | Status: AC
  Administered 2022-09-25: 1000 mg via ORAL

## 2022-09-25 SURGICAL SUPPLY — 45 items
ADH SKN CLS APL DERMABOND .7 (GAUZE/BANDAGES/DRESSINGS) ×1
APL PRP STRL LF DISP 70% ISPRP (MISCELLANEOUS) ×2
BLADE SURG 15 STRL LF DISP TIS (BLADE) ×1 IMPLANT
BLADE SURG 15 STRL SS (BLADE) ×1
CHLORAPREP W/TINT 26 (MISCELLANEOUS) ×1 IMPLANT
COVER BACK TABLE 60X90IN (DRAPES) ×1 IMPLANT
COVER MAYO STAND STRL (DRAPES) ×1 IMPLANT
DERMABOND ADVANCED .7 DNX12 (GAUZE/BANDAGES/DRESSINGS) ×1 IMPLANT
DRAPE C-ARM 42X120 X-RAY (DRAPES) IMPLANT
DRAPE INCISE IOBAN 66X45 STRL (DRAPES) ×1 IMPLANT
DRAPE LAPAROSCOPIC ABDOMINAL (DRAPES) ×1 IMPLANT
DRAPE SHEET LG 3/4 BI-LAMINATE (DRAPES) IMPLANT
DRAPE UTILITY XL STRL (DRAPES) ×1 IMPLANT
ELECT REM PT RETURN 9FT ADLT (ELECTROSURGICAL) ×1
ELECTRODE REM PT RTRN 9FT ADLT (ELECTROSURGICAL) ×1 IMPLANT
ENVELOPE ABSORB ANTIBACTERIAL (Mesh General) ×1 IMPLANT
GAUZE 4X4 16PLY ~~LOC~~+RFID DBL (SPONGE) ×1 IMPLANT
GLOVE BIO SURGEON STRL SZ 6.5 (GLOVE) ×1 IMPLANT
GLOVE BIOGEL PI IND STRL 6 (GLOVE) IMPLANT
GLOVE BIOGEL PI IND STRL 7.0 (GLOVE) ×1 IMPLANT
GLOVE ECLIPSE 6.0 STRL STRAW (GLOVE) IMPLANT
GLOVE INDICATOR 6.5 STRL GRN (GLOVE) ×1 IMPLANT
GLOVE SURG SS PI 6.5 STRL IVOR (GLOVE) IMPLANT
GLOVE SURG SS PI 7.5 STRL IVOR (GLOVE) IMPLANT
GOWN STRL REUS W/TWL LRG LVL3 (GOWN DISPOSABLE) IMPLANT
GOWN STRL REUS W/TWL XL LVL3 (GOWN DISPOSABLE) ×1 IMPLANT
KIT HANDSET INTERSTIM COMM (NEUROSURGERY SUPPLIES) IMPLANT
KIT TURNOVER CYSTO (KITS) ×1 IMPLANT
LEAD INTERSTIM 4.32 28 L (Lead) IMPLANT
NDL FORAMN 5 20GA (NEUROSURGERY SUPPLIES) IMPLANT
NEEDLE FORAMN 5 20GA (NEUROSURGERY SUPPLIES) ×1 IMPLANT
NEEDLE HYPO 22GX1.5 SAFETY (NEEDLE) ×1 IMPLANT
NEUROSTIMULATOR 1.7X2X.06 (UROLOGICAL SUPPLIES) IMPLANT
PACK BASIN DAY SURGERY FS (CUSTOM PROCEDURE TRAY) ×1 IMPLANT
PENCIL SMOKE EVACUATOR (MISCELLANEOUS) ×1 IMPLANT
POUCH TYRX ANTIBAC NEURO MED (Mesh General) IMPLANT
STIMULATOR INTERSTIM 2X1.7X.3 (Miscellaneous) IMPLANT
SUT VIC AB 3-0 SH 27 (SUTURE) ×1
SUT VIC AB 3-0 SH 27X BRD (SUTURE) ×1 IMPLANT
SUT VIC AB 4-0 PS2 18 (SUTURE) ×1 IMPLANT
SYR BULB IRRIG 60ML STRL (SYRINGE) ×1 IMPLANT
SYR CONTROL 10ML LL (SYRINGE) ×1 IMPLANT
TOWEL OR 17X26 10 PK STRL BLUE (TOWEL DISPOSABLE) ×1 IMPLANT
TUBE CONNECTING 12X1/4 (SUCTIONS) IMPLANT
WATER STERILE IRR 500ML POUR (IV SOLUTION) ×1 IMPLANT

## 2022-09-25 NOTE — Anesthesia Postprocedure Evaluation (Signed)
Anesthesia Post Note  Patient: Lori Bean  Procedure(s) Performed: SACRAL NERVE STIMULATOR IMPLANTATION (Right: Back)     Patient location during evaluation: PACU Anesthesia Type: General Level of consciousness: awake and alert Pain management: pain level controlled Vital Signs Assessment: post-procedure vital signs reviewed and stable Respiratory status: spontaneous breathing, nonlabored ventilation and respiratory function stable Cardiovascular status: stable and blood pressure returned to baseline Anesthetic complications: no   No notable events documented.  Last Vitals:  Vitals:   09/25/22 1030 09/25/22 1045  BP: (!) 164/85 (!) 159/84  Pulse: 78 70  Resp: 16 14  Temp:    SpO2: 96% 96%                 Audry Pili

## 2022-09-25 NOTE — Transfer of Care (Signed)
Immediate Anesthesia Transfer of Care Note  Patient: Lori Bean  Procedure(s) Performed: SACRAL NERVE STIMULATOR IMPLANTATION (Right: Back)  Patient Location: PACU  Anesthesia Type:General  Level of Consciousness: awake, alert , oriented, and patient cooperative  Airway & Oxygen Therapy: Patient Spontanous Breathing and Patient connected to face mask oxygen  Post-op Assessment: Report given to RN and Post -op Vital signs reviewed and stable Moves all extremities  Post vital signs: Reviewed and stable  Last Vitals:  Vitals Value Taken Time  BP    Temp    Pulse 82 09/25/22 1007  Resp 19 09/25/22 1007  SpO2 100 % 09/25/22 1007  Vitals shown include unvalidated device data.  Last Pain:  Vitals:   09/25/22 0654  TempSrc: Oral  PainSc: 0-No pain      Patients Stated Pain Goal: 6 (16/83/72 9021)  Complications: No notable events documented.

## 2022-09-25 NOTE — Anesthesia Procedure Notes (Signed)
Procedure Name: Intubation Date/Time: 09/25/2022 8:49 AM  Performed by: Rogers Blocker, CRNAPre-anesthesia Checklist: Patient identified, Emergency Drugs available, Suction available and Patient being monitored Patient Re-evaluated:Patient Re-evaluated prior to induction Oxygen Delivery Method: Circle System Utilized Preoxygenation: Pre-oxygenation with 100% oxygen Induction Type: IV induction Ventilation: Mask ventilation without difficulty Laryngoscope Size: Mac and 3 Grade View: Grade I Tube type: Oral Tube size: 7.0 mm Number of attempts: 1 Airway Equipment and Method: Stylet and Bite block Placement Confirmation: ETT inserted through vocal cords under direct vision, positive ETCO2 and breath sounds checked- equal and bilateral Secured at: 22 cm Tube secured with: Tape Dental Injury: Teeth and Oropharynx as per pre-operative assessment

## 2022-09-25 NOTE — H&P (Signed)
REFERRING PHYSICIAN:  Koleen Distance, MD   PROVIDER:  Monico Blitz, MD   MRN: [id number] DOB: 1970-11-30    Subjective     History of Present Illness: Lori Bean is a 52 y.o. female who is seen today as an office consultation at the request of Dr. Alan Ripper for evaluation of New Consultation .  52 year old female who presents today for evaluation of fecal incontinence.  Patient underwent temporary InterStim placement and developed improvement in her urinary and bowel symptoms.  She had greater then 50% improvement in urinary leaks and fecal accidents.  She is here today to discuss permanent sacral neurostimulator placement.     Review of Systems: A complete review of systems was obtained from the patient.  I have reviewed this information and discussed as appropriate with the patient.  See HPI as well for other ROS.     Medical History: Past Medical History      Past Medical History:  Diagnosis Date   Anemia     Anxiety     Arthritis     Asthma, unspecified asthma severity, unspecified whether complicated, unspecified whether persistent     CHF (congestive heart failure) (CMS-HCC)     COPD (chronic obstructive pulmonary disease) (CMS-HCC)     Diabetes mellitus without complication (CMS-HCC)     DVT (deep venous thrombosis) (CMS-HCC)     GERD (gastroesophageal reflux disease)     History of cancer     Hypertension     Sleep apnea          There is no problem list on file for this patient.     Past Surgical History       Past Surgical History:  Procedure Laterality Date   LUMBAR LAMINECTOMY/DECOMPRESSION MICRODISCECTOMY 2 LEVELS (Left: Back)   04/01/2012    Dr. Hal Neer   LUMBAR WOUND DEBRIDEMENT   04/29/2012    Dr. Hal Neer   POSTERIOR LUMBAR INTERBODY FUSION LUMBAR FIVE-SACRAL ONE, PERCUTANEOUS SCREWS   12/28/2013    Dr. Hal Neer   COLONOSCOPY WITH PROPOFOL   01/23/2016    Dr. Penelope Coop   KNEE ARTHROSCOPY WITH MENISCAL REPAIR (Right: Knee)    09/28/2016    Dr. Devonne Doughty   CHOLECYSTECTOMY            Allergies      Allergies  Allergen Reactions   Aspirin Vomiting              Current Outpatient Medications on File Prior to Visit  Medication Sig Dispense Refill   ARIPiprazole (ABILIFY) 20 MG tablet Take 20 mg by mouth once daily       atorvastatin (LIPITOR) 40 MG tablet Take 40 mg by mouth once daily       BREZTRI AEROSPHERE 160-9-4.8 mcg/actuation inhaler Inhale into the lungs       ergocalciferol, vitamin D2, 1,250 mcg (50,000 unit) capsule Take by mouth       gabapentin (NEURONTIN) 300 MG capsule Take 300 mg by mouth 3 (three) times daily       metoprolol succinate (TOPROL-XL) 25 MG XL tablet Take 25 mg by mouth once daily       naloxone (NARCAN) 4 mg/actuation nasal spray Place into one nostril       potassium chloride (KLOR-CON) 10 MEQ ER tablet Take 10 mEq by mouth 2 (two) times daily       triamterene-hydroCHLOROthiazide (MAXZIDE-25) 37.5-25 mg tablet Take 1 tablet by mouth once daily       diethylpropion 25  mg Tab Take by mouth       DULoxetine (CYMBALTA) 20 MG DR capsule Take 20 mg by mouth 2 (two) times daily       glipiZIDE (GLUCOTROL XL) 10 MG XL tablet Take 10 mg by mouth daily with breakfast       metFORMIN (GLUCOPHAGE) 1000 MG tablet Take 1,000 mg by mouth 2 (two) times daily       methocarbamoL (ROBAXIN) 500 MG tablet Take 500 mg by mouth 3 (three) times daily as needed       tolterodine (DETROL LA) 4 MG LA capsule Take by mouth       topiramate (TOPAMAX) 100 MG tablet Take 100 mg by mouth at bedtime       triamcinolone 0.025 % lotion Apply 1 Application  topically 2 (two) times daily       TRULICITY 1.76 HY/0.7 mL subcutaneous pen injector Inject subcutaneously       VENTOLIN HFA 90 mcg/actuation inhaler INHALE 2 PUFFS BY MOUTH FOUR TIMES DAILY        No current facility-administered medications on file prior to visit.      Family History       Family History  Problem Relation Age of Onset   High  blood pressure (Hypertension) Mother     Diabetes Mother     High blood pressure (Hypertension) Father     Diabetes Father     High blood pressure (Hypertension) Sister     Diabetes Sister     Obesity Brother     Deep vein thrombosis (DVT or abnormal blood clot formation) Brother     High blood pressure (Hypertension) Brother     Diabetes Brother     Coronary Artery Disease (Blocked arteries around heart) Paternal Aunt          Social History        Tobacco Use  Smoking Status Every Day   Packs/day: 1   Types: Cigarettes  Smokeless Tobacco Not on file      Social History  Social History         Socioeconomic History   Marital status: Single  Tobacco Use   Smoking status: Every Day      Packs/day: 1      Types: Cigarettes  Substance and Sexual Activity   Alcohol use: Never   Drug use: Never        Objective:        Vitals:   09/25/22 0654  BP: (!) 177/80  Pulse: 83  Resp: 18  Temp: (!) 97.4 F (36.3 C)  SpO2: 96%      Exam Gen: NAD Abd: soft Rectal: Moderate tone, decreased squeeze pressures     Labs, Imaging and Diagnostic Testing:   Procedure: Anoscopy Surgeon: Marcello Moores After the risks and benefits were explained, written consent was obtained for above procedure.  A medical assistant chaperone was present thoroughout the entire procedure.  Anesthesia: none Diagnosis: fecal incontinence Findings: No hemorrhoid disease noted.  No masses     Assessment and Plan:  Diagnoses and all orders for this visit:   Incontinence of feces with fecal urgency     52 year old female who presents to the office for evaluation of fecal incontinence.  She is underwent temporary placement of neurostimulator and experienced greater than 50% improvement in her symptoms.  We have discussed permanent placement of sacral neurostimulator.  We have discussed the risk and benefits including bleeding, nonfunction, need for battery exchange and lead exchange.  All questions  were answered.  Patient is agreeable to proceed.   No follow-ups on file.    Rosario Adie, MD Colon and Rectal Surgery [facility]

## 2022-09-25 NOTE — Op Note (Signed)
09/25/2022  9:53 AM  PATIENT:  Lori Bean  52 y.o. female  Patient Care Team: [name], PA as PCP - General (Physician Assistant) [name], MD as PCP - Cardiology (Cardiology) Karie Chimera, MD as Referring Physician (Neurosurgery) Dorna Leitz, MD as Consulting Physician (Orthopedic Surgery) Loel Dubonnet, NP as Nurse Practitioner (Cardiology)  PRE-OPERATIVE DIAGNOSIS:  fecal incontinence  POST-OPERATIVE DIAGNOSIS:  fecal incontinence  PROCEDURE:  SACRAL NERVE STIMULATOR IMPLANTATION    Surgeon(s): Leighton Ruff, MD  ASSISTANT: none  ANESTHESIA:   general  EBL: 54m  Total I/O In: 500 [I.V.:500] Out: -     SPECIMEN: none  DISPOSITION OF SPECIMEN: N/A  COUNTS:  YES  PLAN OF CARE: Discharge to home after PACU  PATIENT DISPOSITION:  PACU - hemodynamically stable.  INDICATION: Fecal incontinence refractory to medical treatment.  The patient suffers from fecal incontinence.  she has tried multiple medications to manage this without success.  A test phase was conducted with a temporary device and the patient had >50% improvement in her fecal incontinence symptoms.  The patient wishes for a permanent device to be placed.  The risks and benefits of the surgery were described to the patient and consent was signed and placed on chart prior to the OR.  DESCRIPTION: the patient was identified in the preoperative holding area and taken to the OR where they were laid prone on the operating room table.  MAC anesthesia was induced without difficulty. SCDs were also noted to be in place prior to the initiation of anesthesia.  Pillows were placed under lower abdomen to flatten the sacrum and under shins to allow the toes to dangle freely. A ground pad was placed on the bottom of the patient's foot and the proximal ends of the j-hook patient cable were connected to the ground pad and the external neurostimulator (ENS).The patient was then prepped and draped  in the usual sterile fashion.  A surgical timeout was performed indicating the correct patient, procedure, positioning and need for preoperative antibiotics.  The c-arm was moved into AP position to provide fluoroscopic guidance of the sacrum. The medial edges of the foramina were identified and marked. The c-arm was then moved into the lateral position to identify the S3 foramen. Once the needle entry point was determined, local injection of 0.5% Marcaine was administered bilaterally. A foramen needle was placed in the superior, medial aspect of the S3 foramen and appropriate needle depth was visualized utilizing fluoroscopy. Proper S3 needle location was also confirmed by direct observation of the lifting of the perineum or "bellowing," and plantar flexion of the great toe utilizing the j-hook patient cable, the external neurostimulator and Verify controller. The foramen needle stylet was removed and a directional guide was placed through the needle using markers on the guide to assure appropriate depth. The foramen needle was removed by sliding over the directional guide. A small incision was made peripherally to the directional guide through the skin. The lead introducer with dilator was placed over the directional guide and utilizing fluoroscopic guidance, the lead introducer was advanced until the radiopaque mark was half-way through the foramen. The dilator was removed along with the directional guide. Using fluoroscopy, the tined lead with a bent stylet was placed through the introducer until electrodes one and two straddled the anterior surface of the sacrum. All four electrodes were tested, observing "bellows" and plantar flexion of the great toe utilizing the j-hook patient cable and the external neurostimulator with Verify controller. After satisfactory  lead positioning was confirmed, the introducer was retracted over the lead under continuous fluoroscopy, deploying the tines into presacral tissue.  Retesting of all four electrodes confirming appropriate responses was completed.   The potential internal neurostimulator pocket site was identified below the iliac crest and lateral to the sacrum. Local anesthesia was administered and an incision was made into the subcutaneous tissue creating a connection site. Blunt dissection was used to create a small pocket with hemostasis achieved.  A tunneling tool with sheath was placed from the lead exit site subcutaneously to the small incised pocket site. The tunneling tool was removed and the lead was fed through the sheath, exiting at the pocket connection site. The sheath was removed. The lead was cleaned and dried. The lead was inserted into the header of the InterStim neurostimulator until the blue tip was visualized at the distal window. The single set screw was tightened using the torque wrench until audible click was heard.  The entire device was placed in an antibacterial mesh pocket. This was placed into the pocket with the etched identification side placed upwards and any excessive lead was placed around the neurostimulator. The clinician programmer telemetry head, covered in a sterile sleeve, was placed over the implanted neurostimulator to ensure proper lead connection and that parameters were within normal range. Impedances were confirmed to be within normal limits, greater than 50 and less than 4,000 ohms.  The wound was closed with a running 3-0 Vicryl subcuticular suture and a 4-0 Vicryl running skin suture. Counts were correct. Dermabond was placed over the incision. The patient was transferred to the PACU in satisfactory condition. Using the clinician programmer, the INS was programmed.   Patient was provided utilization instructions for the patient programmer prior to discharge.   Rosario Adie, MD  Colorectal and [place] [facility]

## 2022-09-25 NOTE — Discharge Instructions (Addendum)
POST OP INSTRUCTIONS  Always review your discharge instruction sheet given to you by the facility where your surgery was performed.   A prescription for pain medication may be given to you upon discharge. Take your pain medication as prescribed, if needed. If narcotic pain medicine is not needed, then you make take acetaminophen (Tylenol) or ibuprofen (Advil) as needed.  Take your usually prescribed medications unless otherwise directed. If you need a refill on your pain medication, please contact our office. All narcotic pain medicine now requires a paper prescription.  Phoned in and fax refills are no longer allowed by law.  Prescriptions will not be filled after 5 pm or on weekends.  You should follow a light diet for the remainder of the day after your procedure. Most patients will experience some mild swelling and/or bruising in the area of the incision. It may take several days to resolve. It is common to experience some constipation if taking pain medication after surgery. Increasing fluid intake and taking a stool softener (such as Colace) will usually help or prevent this problem from occurring. A mild laxative (Milk of Magnesia or Miralax) should be taken according to package directions if there are no bowel movements after 48 hours.  Your surgeon used Dermabond (skin glue) on the incision, you may shower in 24 hours.  The glue will flake off over the next 2-3 weeks.  ACTIVITIES:  Limit to basic activity for the next 72 hours. Do no strenuous exercise or activity for 1 week. You may drive when you are no longer taking prescription pain medication, you can comfortably wear a seatbelt, and you can maneuver your car.   WHEN TO CALL YOUR DOCTOR [phone]): Fever over 101.0 Chills Continued bleeding from incision Increased redness and tenderness at the site Shortness of breath, difficulty breathing   The clinic staff is available to answer your questions during regular business hours.  Please don't hesitate to call and ask to speak to one of the nurses or medical assistants for clinical concerns. If you have a medical emergency, go to the nearest emergency room or call 911.  A surgeon from [facility] is always on call at the hospital.     For further information, please visit [email or web address]           No acetaminophen/Tylenol until after 1:45 pm today if needed.  Oxycodone '5mg'$  was administered today at 10:21am, you were prescribed Tramadol, if Tramadol is needed you can take this at 4:21pm        Post Anesthesia Home Care Instructions  Activity: Get plenty of rest for the remainder of the day. A responsible individual must stay with you for 24 hours following the procedure.  For the next 24 hours, DO NOT: -Drive a car -Paediatric nurse -Drink alcoholic beverages -Take any medication unless instructed by your physician -Make any legal decisions or sign important papers.  Meals: Start with liquid foods such as gelatin or soup. Progress to regular foods as tolerated. Avoid greasy, spicy, heavy foods. If nausea and/or vomiting occur, drink only clear liquids until the nausea and/or vomiting subsides. Call your physician if vomiting continues.  Special Instructions/Symptoms: Your throat may feel dry or sore from the anesthesia or the breathing tube placed in your throat during surgery. If this causes discomfort, gargle with warm salt water. The discomfort should disappear within 24 hours.

## 2022-09-27 ENCOUNTER — Other Ambulatory Visit (HOSPITAL_BASED_OUTPATIENT_CLINIC_OR_DEPARTMENT_OTHER): Payer: Self-pay | Admitting: Family

## 2022-09-27 DIAGNOSIS — I10 Essential (primary) hypertension: Secondary | ICD-10-CM

## 2022-09-28 NOTE — Telephone Encounter (Signed)
Rx request sent to pharmacy.  

## 2022-09-30 ENCOUNTER — Other Ambulatory Visit (HOSPITAL_COMMUNITY): Payer: Self-pay

## 2022-09-30 MED ORDER — NALOXONE HCL 4 MG/0.1ML NA LIQD
NASAL | 11 refills | Status: DC
  Filled 2022-09-30: qty 2, 30d supply, fill #0

## 2022-09-30 MED ORDER — OXYCODONE HCL 10 MG PO TABS
10.0000 mg | ORAL_TABLET | Freq: Every day | ORAL | 0 refills | Status: DC
  Filled 2022-09-30: qty 180, 30d supply, fill #0

## 2022-10-02 ENCOUNTER — Ambulatory Visit (INDEPENDENT_AMBULATORY_CARE_PROVIDER_SITE_OTHER): Payer: Medicaid Other | Admitting: Family

## 2022-10-02 ENCOUNTER — Encounter (HOSPITAL_BASED_OUTPATIENT_CLINIC_OR_DEPARTMENT_OTHER): Payer: Self-pay | Admitting: Family

## 2022-10-02 VITALS — BP 120/80 | HR 90 | Ht 62.0 in | Wt 275.0 lb

## 2022-10-02 DIAGNOSIS — G4733 Obstructive sleep apnea (adult) (pediatric): Secondary | ICD-10-CM | POA: Diagnosis not present

## 2022-10-02 DIAGNOSIS — I5032 Chronic diastolic (congestive) heart failure: Secondary | ICD-10-CM

## 2022-10-02 DIAGNOSIS — E782 Mixed hyperlipidemia: Secondary | ICD-10-CM | POA: Diagnosis not present

## 2022-10-02 DIAGNOSIS — I10 Essential (primary) hypertension: Secondary | ICD-10-CM | POA: Diagnosis not present

## 2022-10-02 MED ORDER — FUROSEMIDE 40 MG PO TABS: 40.0000 mg | ORAL_TABLET | Freq: Every day | ORAL | 3 refills | Status: DC

## 2022-10-02 NOTE — Progress Notes (Addendum)
Office Visit    Patient Name: Lori Bean Date of Encounter: 10/02/2022  PCP:  [name], [facility]  Cardiologist:  [name], MD  Advanced Practice Provider:  Loel Dubonnet, NP Electrophysiologist:  None     Chief Complaint    Lori Bean is a 52 y.o. female presents today for heart failure follow-up  Past Medical History    Past Medical History:  Diagnosis Date   Abdominal hernia    Bipolar affective disorder ([place])    Breast pain in female    09-23-2022  per pt right side due to previous chronic cough which resolved   Chronic diastolic CHF (congestive heart failure) ([place]) 2013   followed by dr t. Oval Linsey   Chronic pain syndrome    COPD mixed type [facility])    pulmonologist--- [name]   Depression    GAD (generalized anxiety disorder)    GERD (gastroesophageal reflux disease)    Heart murmur    Hepatic steatosis    followed by GI w/ [facility] ;    liver bx 05-14-2022 mild active steatohepatitis, fibrosis stage 0-1, early stages primary biliary cholangitis   History of acute respiratory failure    multiple admission for  COPd/ asthma exacerbation's w/ failure (09-22-2022  last admission 08-26-2022)   History of DVT (deep vein thrombosis)    08-19-213  RUL thrombosis cephalic vein and [id number]  LUE  superficial thrombus bachial vein PICC line site;  01-30-2014   LLE   History of substance abuse ([place])    crack/ opiates/ cannibus  (09-23-2022  pt stated last used any type 2016)   Hyperlipidemia, mixed    Hypertension    takes meds   Incontinence of feces with fecal urgency    s/p  stage one sacrum nerve stimulator  09/ 2023 by dr Marcello Moores in office   Lymphedema of both lower extremities 06/07/2018   per pt wraps legs nightly   Morbid (severe) obesity due to excess calories ([place])    Neuropathy    diabetic neuropathy and left leg post op lumbar surgery   OA (osteoarthritis)    OSA treated with  BiPAP    pulmonologist--- [name];   per pt uses nightly   Overactive bladder    Schizophrenia ([place])    Type 2 diabetes mellitus ([place])    followed by pcp;   (09-23-2022  pt stated she check blood sugar TID, fastering avergae 89-140)   Past Surgical History:  Procedure Laterality Date   CHOLECYSTECTOMY, LAPAROSCOPIC  11/09/2007   '@[facility]'$  by dr Lucia Gaskins   COLONOSCOPY WITH PROPOFOL N/A 01/23/2016   Procedure: COLONOSCOPY WITH PROPOFOL;  Surgeon: Wonda Horner, MD;  Location: [facility];  Service: Endoscopy;  Laterality: N/A;   KNEE ARTHROSCOPY WITH MENISCAL REPAIR Right 09/28/2016   Procedure: KNEE ARTHROSCOPY WITH MENISCAL REPAIR;  Surgeon: Dorna Leitz, MD;  Location: [place];  Service: Orthopedics;  Laterality: Right;  Right partial meniscectomy and chondroplasty, patellar/femoral joint and medial femoral condyle    LUMBAR LAMINECTOMY/DECOMPRESSION MICRODISCECTOMY  04/01/2012   Procedure: LUMBAR LAMINECTOMY/DECOMPRESSION MICRODISCECTOMY 2 LEVELS;  Surgeon: Faythe Ghee, MD;  Location: [facility];  Service: Neurosurgery;  Laterality: Left;  Lumbar four-five, lumbar five sacral one microdiscectomy    LUMBAR WOUND DEBRIDEMENT  04/29/2012   Procedure: LUMBAR WOUND DEBRIDEMENT;  Surgeon: Faythe Ghee, MD;  Location: [facility];  Service: Neurosurgery;  Laterality: N/A;  lumbar wound debridement  ORIF ORBITAL FRACTURE Right 1989   per pt has retained hardware   POSTERIOR FUSION LUMBAR SPINE  12/28/2013   '@[facility]'$  by dr Hal Neer;   bilateral L5-S1 laminectomy / discectomy/ decompression / fusion   ROTATOR CUFF REPAIR Right    yrs ago   TUBAL LIGATION Bilateral 1999   PPTL    Allergies  Allergies  Allergen Reactions   Aspirin Nausea And Vomiting    GI upset    History of Present Illness    Lori Bean is a 52 y.o. female with a hx of chronic diastolic heart failure, hypertensive heart disease, asthma, OSA on CPAP, lymphedema, schizophrenia, bipolar disorder, obesity, prior PE  last seen 06/26/22.  She was admitted 03/28/21 with acute hypercarbic respiratory failure requiring intubation.  Cardiology was consulted for elevated troponin.  Cardiac CT which was less than ideal given body habitus though coronary calcium score of 0.  Troponin was deemed demand ischemia and no further ischemic evaluation warranted.  Her home antihypertensive regimen was resumed.  She seen 06/05/2021 in hospital follow-up.  She was referred to PREP exercise program. Recommended to follow up in 3-4 months which was not completed.  Last seen in clinic 06/26/22 she was doing overall well.  However she was noted to be out of her triamterene-hydrochlorothiazide and atorvastatin.  These were resumed.  Admitted 12/13 - 08/28/2022 treated for COPD exacerbation.  She was also provided IV Lasix for lower extremity edema.  09/25/2022 she underwent sacral nerve stimulator implantation.  She presents today for follow-up independently.  She has some atypical right-sided chest pain that is tender on palpation for which she has been taking ibuprofen with some relief.  Her exertional dyspnea is overall unchanged.  No orthopnea, PND.  Notes her lower extremity edema may be worse over the past few weeks.  Her primary care last week started on Lasix 20 mg daily and she has not noticed much of a response.    She is having difficulty getting her Symbicort per her report as pharmacy has told her she has a Consulting civil engineer but she does not.  She did go to the Social Security office on Tuesday and was told it would be corrected within 24 hours but has not been done.  EKGs/Labs/Other Studies Reviewed:   The following studies were reviewed today:  CT coronary study 04/01/21:   IMPRESSION: 1. Coronary calcium score of 0. This was 0 percentile for age and sex matched control.   2.  Normal coronary origin with right dominance.   3. No evidence of calcified plaque. Poor quality study for non calcified plaque with  possible high grade soft plaque in the mid LCx. The distal LAD is not visualized.   4. The study was not submitted for FFR analysis due to poor quality and significant stitch artifact.   5. Recommend cardiac cath or nuclear stress testing if clinical suspicion high.     Echo from 03/29/21:    1. Left ventricular ejection fraction, by estimation, is 60 to 65%. The  left ventricle has normal function. The left ventricle has no regional  wall motion abnormalities. Left ventricular diastolic parameters were  normal.   2. Right ventricular systolic function is normal. The right ventricular  size is normal.   3. The mitral valve is normal in structure. No evidence of mitral valve  regurgitation. No evidence of mitral stenosis.   4. The aortic valve is normal in structure. Aortic valve regurgitation is  not visualized. No aortic stenosis  is present.   5. The inferior vena cava is normal in size with greater than 50%  respiratory variability, suggesting right atrial pressure of 3 mmHg.   Comparison(s): Prior images unable to be directly viewed, comparison made  by report only.   EKG: EKG ordered today. EKG performed today demonstrates NSR 97bpm with non specific T wave changes (TWI V5, V6)  Recent Labs: 08/26/2022: B Natriuretic Peptide 11.6 08/28/2022: ALT 21; Hemoglobin 15.4; Platelets 228 08/29/2022: BUN 39; Creatinine, Ser 0.96; Potassium 4.4; Sodium 134  Recent Lipid Panel    Component Value Date/Time   CHOL 141 04/02/2021 0548   CHOL 148 06/07/2018 1024   TRIG 135 04/02/2021 0548   HDL 48 04/02/2021 0548   HDL 51 06/07/2018 1024   CHOLHDL 2.9 04/02/2021 0548   VLDL 27 04/02/2021 0548   LDLCALC 66 04/02/2021 0548   LDLCALC 76 06/07/2018 1024    Home Medications   Current Meds  Medication Sig   albuterol (PROVENTIL) (2.5 MG/3ML) 0.083% nebulizer solution Take 2.5 mg by nebulization every 6 (six) hours as needed for wheezing or shortness of breath.   albuterol (VENTOLIN  HFA) 108 (90 Base) MCG/ACT inhaler Inhale 1-2 puffs into the lungs every 6 (six) hours as needed for wheezing or shortness of breath. (Patient taking differently: Inhale 1-2 puffs into the lungs every 6 (six) hours as needed for wheezing or shortness of breath.)   ARIPiprazole (ABILIFY) 20 MG tablet Take 20 mg by mouth daily.   atorvastatin (LIPITOR) 40 MG tablet Take 1 tablet (40 mg total) by mouth daily. (Patient taking differently: Take 40 mg by mouth daily.)   Budeson-Glycopyrrol-Formoterol (BREZTRI AEROSPHERE) 160-9-4.8 MCG/ACT AERO Inhale 2 puffs into the lungs in the morning and at bedtime. (Patient taking differently: Inhale 2 puffs into the lungs in the morning and at bedtime.)   DULoxetine (CYMBALTA) 20 MG capsule Take 20 mg by mouth 2 (two) times daily.   ergocalciferol (VITAMIN D2) 1.25 MG (50000 UT) capsule Take 1 capsule (50,000 Units total) by mouth once a week. (Patient taking differently: Take 50,000 Units by mouth once a week. Monday)   gabapentin (NEURONTIN) 300 MG capsule Take 300 mg by mouth 3 (three) times daily.   glipiZIDE (GLUCOTROL XL) 10 MG 24 hr tablet Take 10 mg by mouth daily with breakfast.   metFORMIN (GLUCOPHAGE) 1000 MG tablet Take 1,000 mg by mouth 2 (two) times daily with a meal.   methocarbamol (ROBAXIN) 500 MG tablet Take 1 tablet (500 mg total) by mouth 3 (three) times daily as needed or muscle spasms (Patient taking differently: Take 500 mg by mouth 3 (three) times daily as needed for muscle spasms.)   metoprolol succinate (TOPROL-XL) 50 MG 24 hr tablet Take 50 mg by mouth daily.   naloxone (NARCAN) nasal spray 4 mg/0.1 mL 1 spray every 2 minutes as needed for opioid overdose; spray 1 dose into ONE nostril; alternate nostrils w each dose until help arrives   naloxone (NARCAN) nasal spray 4 mg/0.1 mL 1 spray every 2 minutes as needed for opioid overdose; spray 1 dose into ONE nostril; alternate nostrils w each dose until help arrives   Oxycodone HCl 10 MG TABS Take  1 tablet (10 mg total) by mouth 6 (six) times daily as needed for pain   OZEMPIC, 0.25 OR 0.5 MG/DOSE, 2 MG/3ML SOPN Inject 0.5 mg into the skin once a week. Monday's   potassium chloride (KLOR-CON) 10 MEQ tablet Take 10 mEq by mouth 2 (two) times daily.  tolterodine (DETROL LA) 4 MG 24 hr capsule Take 4 mg by mouth daily.   traMADol (ULTRAM) 50 MG tablet Take 1-2 tablets (50-100 mg total) by mouth every 6 (six) hours as needed.   Triamcinolone Acetonide 0.025 % LOTN Apply 1 Application topically 2 (two) times daily as needed.   triamterene-hydrochlorothiazide (MAXZIDE-25) 37.5-25 MG tablet Take 1 tablet by mouth daily. (Patient taking differently: Take 1 tablet by mouth daily.)   ursodiol (ACTIGALL) 300 MG capsule Take 300 mg by mouth 2 (two) times daily.   valsartan (DIOVAN) 80 MG tablet Take 1 tablet (80 mg total) by mouth daily.     Review of Systems      All other systems reviewed and are otherwise negative except as noted above.  Physical Exam    VS:  BP 120/80   Pulse 90   Ht '5\' 2"'$  (1.575 m)   Wt 275 lb (124.7 kg)   LMP 10/15/2018   BMI 50.30 kg/m  , BMI Body mass index is 50.3 kg/m.  Wt Readings from Last 3 Encounters:  10/02/22 275 lb (124.7 kg)  09/25/22 261 lb (118.4 kg)  08/26/22 265 lb 10.5 oz (120.5 kg)     GEN: Well nourished, obese, well developed, in no acute distress. HEENT: normal. Neck: Supple, no JVD, carotid bruits, or masses. Cardiac: RRR, no murmurs, rubs, or gallops. No clubbing, cyanosis. Radials/PT 2+ and equal bilaterally. Bilateral 2+ lower leg edema.  Respiratory:  Respirations regular and unlabored, clear to auscultation bilaterally. GI: Soft, nontender, nondistended. MS: No deformity or atrophy. Skin: Warm and dry, no rash. Neuro:  Strength and sensation are intact. Psych: Normal affect.  Assessment & Plan    Chronic diastolic heart failure-echocardiogram 03/29/2021 with LVEF 60 to 65%, no RWMA, normal diastolic parameters, no significant  valvular normalities.  Volume status difficult to ascertain due to lymphedema.  She does have lower extremity edema.. Low salt diet and fluid restriction <2L encouraged. Increase Lasix to '40mg'$  BID x 2 days then '40mg'$  QD. CMP, BNP today. GDMT Farxiga, Metoprolol. Consider SGLT2i at follow up.   Hyperlipidemia-atorvastatin 40 mg daily resumed when seen October 2023.  Update lipid panel today.  Financial constraint - Unable to afford Symbicort of CPAP supplies as she is being told she has secondary insurance but only has Medicaid. She went to [employer] office Tuesday and was told would be corrected in 24h but not yet corrected. Discussed with SW team - encouraged to f/u with SS office and see if pulmonology has samples. Called her after office visit and she agreed with plan.  Hypertension- BP well controlled. Continue current antihypertensive regimen.    Asthma - Continue to follow with PCP and pulmonology.   OSA - CPAP compliance encouraged. Congratulated on using regularly.   Obesity - Weight loss via diet and exercise encouraged. Discussed the impact being overweight would have on cardiovascular risk.   Schizophrenia/Bipolar disorder - Continue to follow with PCP.   DM2 -appreciate inclusion of Ozempic.  Lymphedema - She wraps her legs nightly. Continue lower extremity elevation. Encouraged low salt diet and utilization of her compression hose.  Disposition: Follow up in 6 week(s) with Dr. Oval Linsey or APP.  Signed, Loel Dubonnet, NP 10/02/2022, 8:19 AM [place]

## 2022-10-02 NOTE — Patient Instructions (Signed)
Medication Instructions:  Your physician has recommended you make the following change in your medication:   Start: Lasix '40mg'$  Twice daily for two days, then return to '40mg'$  daily   *If you need a refill on your cardiac medications before your next appointment, please call your pharmacy*   Lab Work: Your physician recommends that you return for lab work today- CMP, BNP, Lipid Panel   If you have labs (blood work) drawn today and your tests are completely normal, you will receive your results only by: MyChart Message (if you have MyChart) OR A paper copy in the mail If you have any lab test that is abnormal or we need to change your treatment, we will call you to review the results.  Follow-Up: At [facility], you and your health needs are our priority.  As part of our continuing mission to provide you with exceptional heart care, we have created designated Provider Care Teams.  These Care Teams include your primary Cardiologist (physician) and Advanced Practice Providers (APPs -  Physician Assistants and Nurse Practitioners) who all work together to provide you with the care you need, when you need it.  We recommend signing up for the patient portal called "MyChart".  Sign up information is provided on this After Visit Summary.  MyChart is used to connect with patients for Virtual Visits (Telemedicine).  Patients are able to view lab/test results, encounter notes, upcoming appointments, etc.  Non-urgent messages can be sent to your provider as well.   To learn more about what you can do with MyChart, go to [email or web address].    Your next appointment:   6 week(s)  Provider:   [name], MD or Laurann Montana, NP    Other Instructions Heart Healthy Diet Recommendations: A low-salt diet is recommended. Meats should be grilled, baked, or boiled. Avoid fried foods. Focus on lean protein sources like fish or chicken with vegetables and fruits. The American Heart  Association is a Microbiologist!  American Heart Association Diet and Lifeystyle Recommendations   Exercise recommendations: The American Heart Association recommends 150 minutes of moderate intensity exercise weekly. Try 30 minutes of moderate intensity exercise 4-5 times per week. This could include walking, jogging, or swimming.

## 2022-10-03 LAB — COMPREHENSIVE METABOLIC PANEL
ALT: 23 IU/L (ref 0–32)
AST: 9 IU/L (ref 0–40)
Albumin/Globulin Ratio: 1.5 (ref 1.2–2.2)
Albumin: 4.1 g/dL (ref 3.8–4.9)
Alkaline Phosphatase: 97 IU/L (ref 44–121)
BUN/Creatinine Ratio: 23 (ref 9–23)
BUN: 12 mg/dL (ref 6–24)
Bilirubin Total: 0.2 mg/dL (ref 0.0–1.2)
CO2: 21 mmol/L (ref 20–29)
Calcium: 9.7 mg/dL (ref 8.7–10.2)
Chloride: 102 mmol/L (ref 96–106)
Creatinine, Ser: 0.52 mg/dL — ABNORMAL LOW (ref 0.57–1.00)
Globulin, Total: 2.7 g/dL (ref 1.5–4.5)
Glucose: 140 mg/dL — ABNORMAL HIGH (ref 70–99)
Potassium: 4.3 mmol/L (ref 3.5–5.2)
Sodium: 139 mmol/L (ref 134–144)
Total Protein: 6.8 g/dL (ref 6.0–8.5)
eGFR: 112 mL/min/{1.73_m2} (ref >59)

## 2022-10-03 LAB — LIPID PANEL
Chol/HDL Ratio: 2.3 ratio (ref 0.0–4.4)
Cholesterol, Total: 158 mg/dL (ref 100–199)
HDL: 68 mg/dL (ref >39)
LDL Chol Calc (NIH): 75 mg/dL (ref 0–99)
Triglycerides: 82 mg/dL (ref 0–149)
VLDL Cholesterol Cal: 15 mg/dL (ref 5–40)

## 2022-10-03 LAB — BRAIN NATRIURETIC PEPTIDE: BNP: 10.3 pg/mL (ref 0.0–100.0)

## 2022-10-05 ENCOUNTER — Telehealth: Payer: Self-pay | Admitting: Family

## 2022-10-05 MED ORDER — FUROSEMIDE 40 MG PO TABS: 40.0000 mg | ORAL_TABLET | Freq: Every day | ORAL | 3 refills | Status: AC

## 2022-10-05 NOTE — Telephone Encounter (Signed)
Rx request sent to pharmacy.  

## 2022-10-05 NOTE — Telephone Encounter (Signed)
*  STAT* If patient is at the pharmacy, call can be transferred to refill team.   1. Which medications need to be refilled? (please list name of each medication and dose if known) furosemide (LASIX) 40 MG tablet   2. Which pharmacy/location (including street and city if local pharmacy) is medication to be sent to?  [place], [place] - [address] AT [place]    3. Do they need a 30 day or 90 day supply? 90 day  Patient states the pharmacy did not receive the refill.

## 2022-10-29 ENCOUNTER — Other Ambulatory Visit (HOSPITAL_COMMUNITY): Payer: Self-pay

## 2022-10-29 MED ORDER — OXYCODONE HCL 10 MG PO TABS
10.0000 mg | ORAL_TABLET | Freq: Every day | ORAL | 0 refills | Status: DC
  Filled 2022-10-29: qty 180, 30d supply, fill #0

## 2022-10-29 MED ORDER — METHOCARBAMOL 500 MG PO TABS
500.0000 mg | ORAL_TABLET | Freq: Three times a day (TID) | ORAL | 1 refills | Status: DC | PRN
  Filled 2022-10-29: qty 90, 30d supply, fill #0

## 2022-11-05 ENCOUNTER — Inpatient Hospital Stay (HOSPITAL_COMMUNITY): Admission: RE | Admit: 2022-11-05 | Payer: No Typology Code available for payment source | Source: Ambulatory Visit

## 2022-11-12 ENCOUNTER — Other Ambulatory Visit: Payer: Self-pay

## 2022-11-13 ENCOUNTER — Ambulatory Visit (HOSPITAL_BASED_OUTPATIENT_CLINIC_OR_DEPARTMENT_OTHER): Payer: PRIVATE HEALTH INSURANCE | Admitting: Family

## 2022-11-23 ENCOUNTER — Other Ambulatory Visit: Payer: Self-pay

## 2022-11-23 ENCOUNTER — Encounter (HOSPITAL_COMMUNITY): Payer: Self-pay

## 2022-11-23 ENCOUNTER — Emergency Department (HOSPITAL_COMMUNITY)
Admission: EM | Admit: 2022-11-23 | Discharge: 2022-11-23 | Disposition: A | Discharge status: Home / Self Care | Payer: Medicaid Other | Attending: Emergency Medicine | Admitting: Emergency Medicine

## 2022-11-23 ENCOUNTER — Emergency Department (HOSPITAL_COMMUNITY): Payer: Medicaid Other

## 2022-11-23 DIAGNOSIS — R2243 Localized swelling, mass and lump, lower limb, bilateral: Secondary | ICD-10-CM | POA: Insufficient documentation

## 2022-11-23 DIAGNOSIS — R0602 Shortness of breath: Secondary | ICD-10-CM | POA: Diagnosis present

## 2022-11-23 DIAGNOSIS — Z79899 Other long term (current) drug therapy: Secondary | ICD-10-CM | POA: Diagnosis not present

## 2022-11-23 DIAGNOSIS — Z1152 Encounter for screening for COVID-19: Secondary | ICD-10-CM | POA: Diagnosis not present

## 2022-11-23 DIAGNOSIS — Z7984 Long term (current) use of oral hypoglycemic drugs: Secondary | ICD-10-CM | POA: Diagnosis not present

## 2022-11-23 DIAGNOSIS — J441 Chronic obstructive pulmonary disease with (acute) exacerbation: Secondary | ICD-10-CM | POA: Insufficient documentation

## 2022-11-23 DIAGNOSIS — I509 Heart failure, unspecified: Secondary | ICD-10-CM | POA: Insufficient documentation

## 2022-11-23 LAB — RESP PANEL BY RT-PCR (RSV, FLU A&B, COVID)  RVPGX2
Influenza A by PCR: NEGATIVE
Influenza B by PCR: NEGATIVE
Resp Syncytial Virus by PCR: NEGATIVE
SARS Coronavirus 2 by RT PCR: NEGATIVE

## 2022-11-23 LAB — CBC
HCT: 45.1 % (ref 36.0–46.0)
Hemoglobin: 14.4 g/dL (ref 12.0–15.0)
MCH: 30.9 pg (ref 26.0–34.0)
MCHC: 31.9 g/dL (ref 30.0–36.0)
MCV: 96.8 fL (ref 80.0–100.0)
Platelets: 192 10*3/uL (ref 150–400)
RBC: 4.66 MIL/uL (ref 3.87–5.11)
RDW: 14.4 % (ref 11.5–15.5)
WBC: 7.8 10*3/uL (ref 4.0–10.5)
nRBC: 0 % (ref 0.0–0.2)

## 2022-11-23 LAB — BASIC METABOLIC PANEL
Anion gap: 8 (ref 5–15)
BUN: 13 mg/dL (ref 6–20)
CO2: 26 mmol/L (ref 22–32)
Calcium: 9 mg/dL (ref 8.9–10.3)
Chloride: 104 mmol/L (ref 98–111)
Creatinine, Ser: 0.75 mg/dL (ref 0.44–1.00)
GFR, Estimated: >60 mL/min (ref >60)
Glucose, Bld: 116 mg/dL — ABNORMAL HIGH (ref 70–99)
Potassium: 3.5 mmol/L (ref 3.5–5.1)
Sodium: 138 mmol/L (ref 135–145)

## 2022-11-23 LAB — TROPONIN I (HIGH SENSITIVITY): Troponin I (High Sensitivity): 7 ng/L (ref <18)

## 2022-11-23 LAB — BRAIN NATRIURETIC PEPTIDE: B Natriuretic Peptide: 5.6 pg/mL (ref 0.0–100.0)

## 2022-11-23 MED ORDER — IPRATROPIUM-ALBUTEROL 0.5-2.5 (3) MG/3ML IN SOLN
3.0000 mL | RESPIRATORY_TRACT | Status: AC
  Administered 2022-11-23 (×3): 3 mL via RESPIRATORY_TRACT
  Filled 2022-11-23 (×2): qty 6

## 2022-11-23 MED ORDER — AZITHROMYCIN 250 MG PO TABS: 250.0000 mg | ORAL_TABLET | Freq: Every day | ORAL | 0 refills | Status: DC

## 2022-11-23 MED ORDER — BUDESONIDE-FORMOTEROL FUMARATE 160-4.5 MCG/ACT IN AERO: 2.0000 | INHALATION_SPRAY | Freq: Two times a day (BID) | RESPIRATORY_TRACT | 12 refills | Status: DC

## 2022-11-23 MED ORDER — PREDNISONE 20 MG PO TABS: 60.0000 mg | ORAL_TABLET | Freq: Every day | ORAL | 0 refills | Status: AC

## 2022-11-23 MED ORDER — PREDNISONE 20 MG PO TABS
60.0000 mg | ORAL_TABLET | Freq: Once | ORAL | Status: AC
  Administered 2022-11-23: 60 mg via ORAL
  Filled 2022-11-23: qty 3

## 2022-11-23 MED ORDER — AZITHROMYCIN 250 MG PO TABS
500.0000 mg | ORAL_TABLET | ORAL | Status: AC
  Administered 2022-11-23: 500 mg via ORAL
  Filled 2022-11-23: qty 2

## 2022-11-23 NOTE — Discharge Instructions (Signed)
You were seen for your COPD in the emergency department.   At home, please take the prednisone and azithromycin we have prescribed you. Also use your inhalers as directed.    Check your MyChart online for the results of any tests that had not resulted by the time you left the emergency department.   Follow-up with your primary doctor in 2-3 days regarding your visit.    Return immediately to the emergency department if you experience any of the following: difficulty breathing, chest pain, or any other concerning symptoms.    Thank you for visiting our Emergency Department. It was a pleasure taking care of you today.

## 2022-11-23 NOTE — ED Triage Notes (Addendum)
Patient has had a productive cough for 2 weeks, thick yellow mucus, causing her shortness of breath. Has CHF. Had pneumonia last time she was feeling the same symptoms. Grandchild is sick. Attempted a nebulizer without relief. 88% room air in triage, placed on 2L [place] improved to 92%.

## 2022-11-23 NOTE — ED Provider Notes (Signed)
[place] EMERGENCY DEPARTMENT AT [facility] Provider Note   CSN: [id number] Arrival date & time: 11/23/22  0809     History  Chief Complaint  Patient presents with   Shortness of Lori Bean is a 52 y.o. female.  52 year old female with history of heart failure with preserved ejection fraction, lymphedema, and COPD not on home oxygen who presents to the emergency department with shortness of breath.  Patient states that over the weekend she started having worsening cough and shortness of breath.  Says that her cough is productive.  Does have a grandson with similar symptoms at home right now.  Says that her inhalers have not been helping as much as usual and she is having difficulty going from room to room so she came into the emergency department for evaluation.  Does have chest tightness that is typical for her COPD exacerbations.  Denies any significant lower extremity swelling or new PND.  No history of intubations for her COPD.       Home Medications Prior to Admission medications   Medication Sig Start Date End Date Taking? Authorizing Provider  azithromycin (ZITHROMAX) 250 MG tablet Take 1 tablet (250 mg total) by mouth daily. 11/23/22  Yes Fransico Meadow, MD  budesonide-formoterol [facility]) 160-4.5 MCG/ACT inhaler Inhale 2 puffs into the lungs in the morning and at bedtime. 11/23/22  Yes Fransico Meadow, MD  predniSONE (DELTASONE) 20 MG tablet Take 3 tablets (60 mg total) by mouth daily for 4 days. 11/23/22 11/27/22 Yes Fransico Meadow, MD  albuterol (PROVENTIL) (2.5 MG/3ML) 0.083% nebulizer solution Take 2.5 mg by nebulization every 6 (six) hours as needed for wheezing or shortness of breath. 08/12/22   [provider]  albuterol (VENTOLIN HFA) 108 (90 Base) MCG/ACT inhaler Inhale 1-2 puffs into the lungs every 6 (six) hours as needed for wheezing or shortness of breath. Patient taking differently: Inhale 1-2 puffs into the  lungs every 6 (six) hours as needed for wheezing or shortness of breath. 08/22/21   Olalere, Cicero Duck A, MD  ARIPiprazole (ABILIFY) 20 MG tablet Take 20 mg by mouth daily. 05/20/21   [provider]  atorvastatin (LIPITOR) 40 MG tablet Take 1 tablet (40 mg total) by mouth daily. Patient taking differently: Take 40 mg by mouth daily. 06/26/22   Loel Dubonnet, NP  Budeson-Glycopyrrol-Formoterol (BREZTRI AEROSPHERE) 160-9-4.8 MCG/ACT AERO Inhale 2 puffs into the lungs in the morning and at bedtime. Patient taking differently: Inhale 2 puffs into the lungs in the morning and at bedtime. 08/22/21   Olalere, Cicero Duck A, MD  DULoxetine (CYMBALTA) 20 MG capsule Take 20 mg by mouth 2 (two) times daily.    [provider]  ergocalciferol (VITAMIN D2) 1.25 MG (50000 UT) capsule Take 1 capsule (50,000 Units total) by mouth once a week. Patient taking differently: Take 50,000 Units by mouth once a week. Monday 08/03/22   Simona Huh, NP  furosemide (LASIX) 40 MG tablet Take 1 tablet (40 mg total) by mouth daily. Please take '40mg'$  twice daily x2 days, then '40mg'$  daily 10/05/22 11/09/23  [name], MD  gabapentin (NEURONTIN) 300 MG capsule Take 300 mg by mouth 3 (three) times daily. 02/02/19   [provider]  glipiZIDE (GLUCOTROL XL) 10 MG 24 hr tablet Take 10 mg by mouth daily with breakfast.    [provider]  metFORMIN (GLUCOPHAGE) 1000 MG tablet Take 1,000 mg by mouth 2 (two) times daily with a meal.  [provider]  methocarbamol (ROBAXIN) 500 MG tablet Take 1 tablet (500 mg total) by mouth 3 (three) times daily as needed or muscle spasms Patient taking differently: Take 500 mg by mouth 3 (three) times daily as needed for muscle spasms. 09/01/22     methocarbamol (ROBAXIN) 500 MG tablet Take 1 tablet (500 mg total) by mouth 3 (three) times daily as needed for muscle spasms. 10/29/22     metoprolol succinate (TOPROL-XL) 50 MG 24 hr tablet Take 50 mg by mouth  daily. 05/22/22   [provider]  naloxone [facility]) nasal spray 4 mg/0.1 mL 1 spray every 2 minutes as needed for opioid overdose; spray 1 dose into ONE nostril; alternate nostrils w each dose until help arrives 05/08/22     naloxone [facility]) nasal spray 4 mg/0.1 mL 1 spray every 2 minutes as needed for opioid overdose; spray 1 dose into ONE nostril; alternate nostrils w each dose until help arrives 09/30/22     Oxycodone HCl 10 MG TABS Take 1 tablet (10 mg total) by mouth 6 (six) times daily as needed for pain. 10/29/22     OZEMPIC, 0.25 OR 0.5 MG/DOSE, 2 MG/3ML SOPN Inject 0.5 mg into the skin once a week. Monday's 08/20/22   [provider]  potassium chloride (KLOR-CON) 10 MEQ tablet Take 10 mEq by mouth 2 (two) times daily. 11/25/21   [provider]  tolterodine (DETROL LA) 4 MG 24 hr capsule Take 4 mg by mouth daily.    [provider]  traMADol (ULTRAM) 50 MG tablet Take 1-2 tablets (50-100 mg total) by mouth every 6 (six) hours as needed. [id number]   Leighton Ruff, MD  Triamcinolone Acetonide 0.025 % LOTN Apply 1 Application topically 2 (two) times daily as needed.    [provider]  triamterene-hydrochlorothiazide (MAXZIDE-25) 37.5-25 MG tablet Take 1 tablet by mouth daily. Patient taking differently: Take 1 tablet by mouth daily. 06/26/22   Loel Dubonnet, NP  ursodiol (ACTIGALL) 300 MG capsule Take 300 mg by mouth 2 (two) times daily. 06/17/22   [provider]  valsartan (DIOVAN) 80 MG tablet Take 1 tablet (80 mg total) by mouth daily. 09/28/22   [name], MD      Allergies    Aspirin    Review of Systems   Review of Systems  Physical Exam Updated Vital Signs BP (!) 168/98   Pulse 83   Temp 99.1 F (37.3 C) (Oral)   Resp 17   Ht '5\' 2"'$  (1.575 m)   Wt 120.2 kg   LMP 10/15/2018   SpO2 96%   BMI 48.47 kg/m  Physical Exam Vitals and nursing note reviewed.  Constitutional:      General: She is not in acute  distress.    Appearance: She is well-developed.     Comments: On new 2 L oxygen  HENT:     Head: Normocephalic and atraumatic.     Right Ear: External ear normal.     Left Ear: External ear normal.     Nose: Nose normal.  Eyes:     Extraocular Movements: Extraocular movements intact.     Conjunctiva/sclera: Conjunctivae normal.     Pupils: Pupils are equal, round, and reactive to light.  Cardiovascular:     Rate and Rhythm: Normal rate and regular rhythm.     Heart sounds: No murmur heard. Pulmonary:     Effort: Pulmonary effort is normal. No respiratory distress.     Breath sounds: Wheezing (  Diffuse) and rhonchi present.  Abdominal:     General: Abdomen is flat. There is no distension.     Palpations: Abdomen is soft. There is no mass.     Tenderness: There is no abdominal tenderness. There is no guarding.  Musculoskeletal:     Cervical back: Normal range of motion and neck supple.     Right lower leg: Edema (1+) present.     Left lower leg: Edema (1+) present.  Skin:    General: Skin is warm and dry.  Neurological:     Mental Status: She is alert and oriented to person, place, and time. Mental status is at baseline.  Psychiatric:        Mood and Affect: Mood normal.     ED Results / Procedures / Treatments   Labs (all labs ordered are listed, but only abnormal results are displayed) Labs Reviewed  BASIC METABOLIC PANEL - Abnormal; Notable for the following components:      Result Value   Glucose, Bld 116 (*)    All other components within normal limits  RESP PANEL BY RT-PCR (RSV, FLU A&B, COVID)  [id number]  CBC  BRAIN NATRIURETIC PEPTIDE  TROPONIN I (HIGH SENSITIVITY)    EKG EKG Interpretation  Date/Time:  Monday November 23 2022 08:29:05 EDT Ventricular Rate:  75 PR Interval:  197 QRS Duration: 91 QT Interval:  401 QTC Calculation: 448 R Axis:   22 Text Interpretation: Sinus rhythm Anterior infarct, old Nonspecific T wave abnormality Confirmed by Margaretmary Eddy [phone]) on 11/23/2022 9:49:42 AM  Radiology DG Chest 2 View  Result Date: 11/23/2022 CLINICAL DATA:  Shortness of breath EXAM: CHEST - 2 VIEW COMPARISON:  CXR 08/26/22 FINDINGS: No pleural effusion. No pneumothorax. Normal cardiac and mediastinal contours. No focal airspace opacity. No radiographically apparent displaced rib fractures. Visualized upper abdomen is unremarkable. Vertebral body heights are maintained. IMPRESSION: No focal airspace opacity. Electronically Signed   By: Marin Roberts M.D.   On: 11/23/2022 09:00    Procedures Procedures   Medications Ordered in ED Medications  ipratropium-albuterol (DUONEB) 0.5-2.5 (3) MG/3ML nebulizer solution 3 mL (3 mLs Nebulization Given 11/23/22 0911)  predniSONE (DELTASONE) tablet 60 mg (60 mg Oral Given 11/23/22 0923)  azithromycin (ZITHROMAX) tablet 500 mg (500 mg Oral Given 11/23/22 [id number])    ED Course/ Medical Decision Making/ A&P                             Medical Decision Making Amount and/or Complexity of Data Reviewed Labs: ordered. Radiology: ordered.  Risk Prescription drug management.   VAELYN CONLY is a 52 y.o. female with comorbidities that complicate the patient evaluation including heart failure preserved ejection fraction, lymphedema, and COPD not on home oxygen who presents emergency department with productive cough, wheezing, and new oxygen requirement  Initial Ddx:  COPD extubation, heart failure exacerbation, URI, pneumonia  MDM:  Feel the patient likely has a URI that may be the same as her grandsons which have caused a COPD exacerbation given her wheezing on exam.  Will check chest x-ray and BNP to evaluate for signs of heart failure.  Chest x-ray will also reveal if there is a pneumonia present.  Will check COVID and flu on her as well.  Plan:  Labs BNP COVID and flu EKG Chest x-ray Nebs Prednisone  ED Summary/Re-evaluation:  Patient reassessed after initial round of nebulizer  treatment.  Reported that she was feeling better  and was now satting in the mid to high 90s on room air.  No signs of heart failure exacerbation based on her workup.  No pneumonia noted.  Since she is having increasing productive cough we will send her home with azithromycin and prednisone.  Reports that she has an adequate amount of her albuterol inhaler but requested a refill of her Symbicort which I have done today.  Will have her follow-up with her primary doctor in 2 to 3 days.  This patient presents to the ED for concern of complaints listed in HPI, this involves an extensive number of treatment options, and is a complaint that carries with it a high risk of complications and morbidity. Disposition including potential need for admission considered.   Dispo: DC Home. Return precautions discussed including, but not limited to, those listed in the AVS. Allowed pt time to ask questions which were answered fully prior to dc.  Records reviewed Outpatient Clinic Notes The following labs were independently interpreted: Chemistry and show no acute abnormality I independently reviewed the following imaging with scope of interpretation limited to determining acute life threatening conditions related to emergency care: Chest x-ray and agree with the radiologist interpretation with the following exceptions: none I personally reviewed and interpreted cardiac monitoring: normal sinus rhythm  I personally reviewed and interpreted the pt's EKG: see above for interpretation  I have reviewed the patients home medications and made adjustments as needed  Final Clinical Impression(s) / ED Diagnoses Final diagnoses:  COPD exacerbation ([place])    Rx / DC Orders ED Discharge Orders          Ordered    budesonide-formoterol (SYMBICORT) 160-4.5 MCG/ACT inhaler  2 times daily        11/23/22 1025    predniSONE (DELTASONE) 20 MG tablet  Daily        11/23/22 1025    azithromycin (ZITHROMAX) 250 MG tablet  Daily         11/23/22 1025              Fransico Meadow, MD 11/23/22 1030

## 2022-11-24 ENCOUNTER — Other Ambulatory Visit (HOSPITAL_COMMUNITY): Payer: Self-pay

## 2022-11-26 ENCOUNTER — Other Ambulatory Visit (HOSPITAL_COMMUNITY): Payer: Self-pay

## 2022-11-26 ENCOUNTER — Other Ambulatory Visit: Payer: Self-pay

## 2022-11-26 MED ORDER — OXYCODONE HCL 10 MG PO TABS
10.0000 mg | ORAL_TABLET | Freq: Every day | ORAL | 0 refills | Status: DC
  Filled 2022-11-26: qty 180, 30d supply, fill #0

## 2022-12-14 ENCOUNTER — Encounter (HOSPITAL_BASED_OUTPATIENT_CLINIC_OR_DEPARTMENT_OTHER): Payer: Self-pay | Admitting: Pulmonary Disease

## 2022-12-14 ENCOUNTER — Ambulatory Visit (INDEPENDENT_AMBULATORY_CARE_PROVIDER_SITE_OTHER): Payer: Commercial Managed Care - HMO | Admitting: Pulmonary Disease

## 2022-12-14 VITALS — BP 160/78 | HR 93 | Temp 98.9°F | Ht 62.0 in | Wt 253.8 lb

## 2022-12-14 DIAGNOSIS — J441 Chronic obstructive pulmonary disease with (acute) exacerbation: Secondary | ICD-10-CM | POA: Diagnosis not present

## 2022-12-14 DIAGNOSIS — G4733 Obstructive sleep apnea (adult) (pediatric): Secondary | ICD-10-CM

## 2022-12-14 MED ORDER — PREDNISONE 10 MG PO TABS: ORAL_TABLET | ORAL | 0 refills | Status: DC

## 2022-12-14 MED ORDER — AMOXICILLIN-POT CLAVULANATE 875-125 MG PO TABS: 1.0000 | ORAL_TABLET | Freq: Two times a day (BID) | ORAL | 0 refills | Status: DC

## 2022-12-14 NOTE — Assessment & Plan Note (Signed)
BiPAP download was reviewed which shows excellent compliance without a single missed night, minimal leak.  Average pressure is 10/7 cm APAP settings.  She requests a replacement machine and we will send in prescription accordingly. Will also send in a prescription for PAP supplies  Weight loss encouraged, compliance with goal of at least 4-6 hrs every night is the expectation. Advised against medications with sedative side effects Cautioned against driving when sleepy - understanding that sleepiness will vary on a day to day basis

## 2022-12-14 NOTE — Assessment & Plan Note (Addendum)
For some reason she is on Breztri as well as Symbicort I explained to her to DC Symbicort and continue Breztri twice daily. She will use albuterol for rescue We will treat her with a [place] and a short course of prednisone for an exacerbation

## 2022-12-14 NOTE — Progress Notes (Signed)
   Subjective:    Patient ID: Lori Bean, female    DOB: 03/01/1971, 52 y.o.   MRN: [id number]  HPI  52 yo smoker for FU of chronic asthma & obstructive sleep apnea. PMH -  hypertensive heart disease, chronic diastolic heart failure.  -bipolar disorder and schizophrenia -chronic pain on oxycodone -chronic lymphedema   She was hospitalized 03/2021 for acute hypercarbic/hypoxic respiratory failure, UDS positive for THC.  Her BiPAP settings were adjusted to 15/8 , then to auto bilevel settings  Annual follow-up visit Blood pressure is high today but she does not report visual symptoms or headaches. She was hospitalized 08/2022 for COPD exacerbation, CT angiogram was negative for PE. She had an ED visit 11/23/2022 for COPD exacerbation and was treated with Z-Pak and prednisone, chest x-ray was negative. She is compliant with her BiPAP machine and denies any problems with mask or pressure.  She has had difficulty getting supplies from DME, apparently insurance has not approved She continues to smoke 1 to 2 cigarettes daily She is compliant with Lasix and does not seem to be treated taking Maxide which is also listed on her medication list  She reports yellow sputum today and feels like her breathing is worse.  She feels like she is coming down with a cold, no fevers or sick contacts  Significant tests/ events reviewed   NPSG 06/2016  moderate OSA with AHI of 26/hour with lowest desaturation of 87%. This was corrected by CPAP of 14 cm with a small fullface mask.   07/2017 titration >> Bipap 16/12   07/20/2018 PFTs - FVC 1.77 (65%), FEV1 1.33 (60%), RATIO 75, DLCOcor 17.01 (78%)    07/04/18 Spirometry- FVC 1.6 (59%), FEV1 1.1 (50%), ratio 69   10/28/2017 Spirometry - FEV1 1.01 (45%)  Ratio 67  Review of Systems neg for any significant sore throat, dysphagia, itching, sneezing, nasal congestion or excess/ purulent secretions, fever, chills, sweats, unintended wt loss, pleuritic or  exertional cp, hempoptysis, orthopnea pnd or change in chronic leg swelling. Also denies presyncope, palpitations, heartburn, abdominal pain, nausea, vomiting, diarrhea or change in bowel or urinary habits, dysuria,hematuria, rash, arthralgias, visual complaints, headache, numbness weakness or ataxia.     Objective:   Physical Exam  Gen. Pleasant, obese, in no distress ENT - no lesions, no post nasal drip Neck: No JVD, no thyromegaly, no carotid bruits Lungs: no use of accessory muscles, no dullness to percussion, decreased without rales or rhonchi  Cardiovascular: Rhythm regular, heart sounds  normal, no murmurs or gallops, no peripheral edema Musculoskeletal: No deformities, no cyanosis or clubbing , no tremors       Assessment & Plan:

## 2022-12-14 NOTE — Patient Instructions (Addendum)
  X Rx Augmentin 875 mg twice daily for 7 days Prednisone 10 mg tabs  Take 2 tabs daily with food x 5ds, then 1 tab daily with food x 5ds then STOP  Stop using Symbicort. Use Breztri 2 puffs twice daily   xRx for replacement auto BiPAP -EPAP minimum 5, IPAP maximum 15, pressure support +3 cm PAP supplies

## 2022-12-23 ENCOUNTER — Other Ambulatory Visit (HOSPITAL_COMMUNITY): Payer: Self-pay

## 2022-12-23 MED ORDER — METHOCARBAMOL 500 MG PO TABS
500.0000 mg | ORAL_TABLET | Freq: Three times a day (TID) | ORAL | 1 refills | Status: DC | PRN
  Filled 2022-12-23: qty 45, 15d supply, fill #0

## 2022-12-23 MED ORDER — VITAMIN D (ERGOCALCIFEROL) 1.25 MG (50000 UNIT) PO CAPS
50000.0000 [IU] | ORAL_CAPSULE | ORAL | 1 refills | Status: DC
  Filled 2022-12-23: qty 12, 84d supply, fill #0

## 2022-12-23 MED ORDER — OXYCODONE HCL 10 MG PO TABS
10.0000 mg | ORAL_TABLET | Freq: Every day | ORAL | 0 refills | Status: DC
  Filled 2022-12-23 – 2022-12-24 (×3): qty 90, 15d supply, fill #0

## 2022-12-24 ENCOUNTER — Other Ambulatory Visit (HOSPITAL_COMMUNITY): Payer: Self-pay

## 2022-12-24 ENCOUNTER — Other Ambulatory Visit: Payer: Self-pay

## 2023-01-06 ENCOUNTER — Other Ambulatory Visit (HOSPITAL_COMMUNITY): Payer: Self-pay

## 2023-01-06 ENCOUNTER — Other Ambulatory Visit: Payer: Self-pay

## 2023-01-06 MED ORDER — OXYCODONE HCL 10 MG PO TABS
10.0000 mg | ORAL_TABLET | Freq: Every day | ORAL | 0 refills | Status: DC
  Filled 2023-01-06 (×2): qty 75, 15d supply, fill #0

## 2023-01-19 IMAGING — DX DG CHEST 2V
2 series · 2 of 2 positions shown · non-contrast
Comparison: Chest radiographs 06/28/2020 and earlier.

CLINICAL DATA: 49-year-old female with midsternal chest pain
radiating to the throat. Nausea, shortness of breath, lightheaded.

EXAM:
CHEST - 2 VIEW

[chest lat]
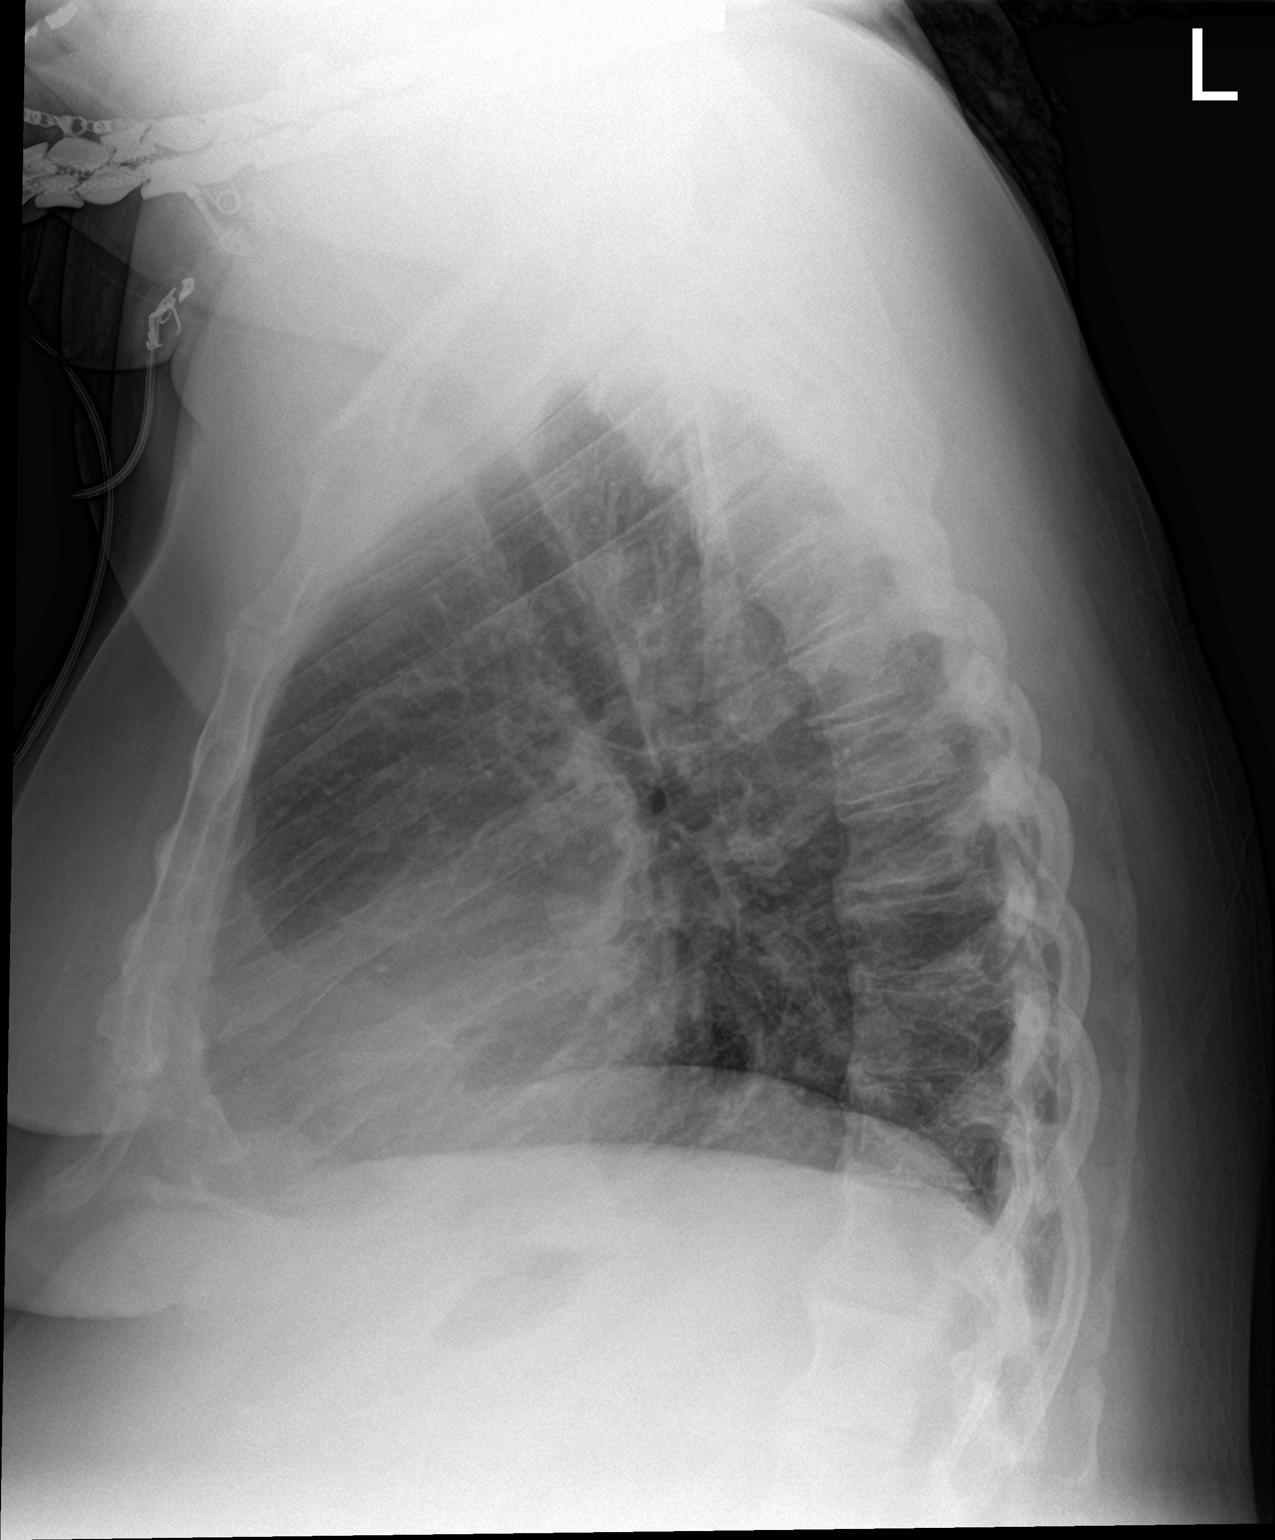

[chest ap]
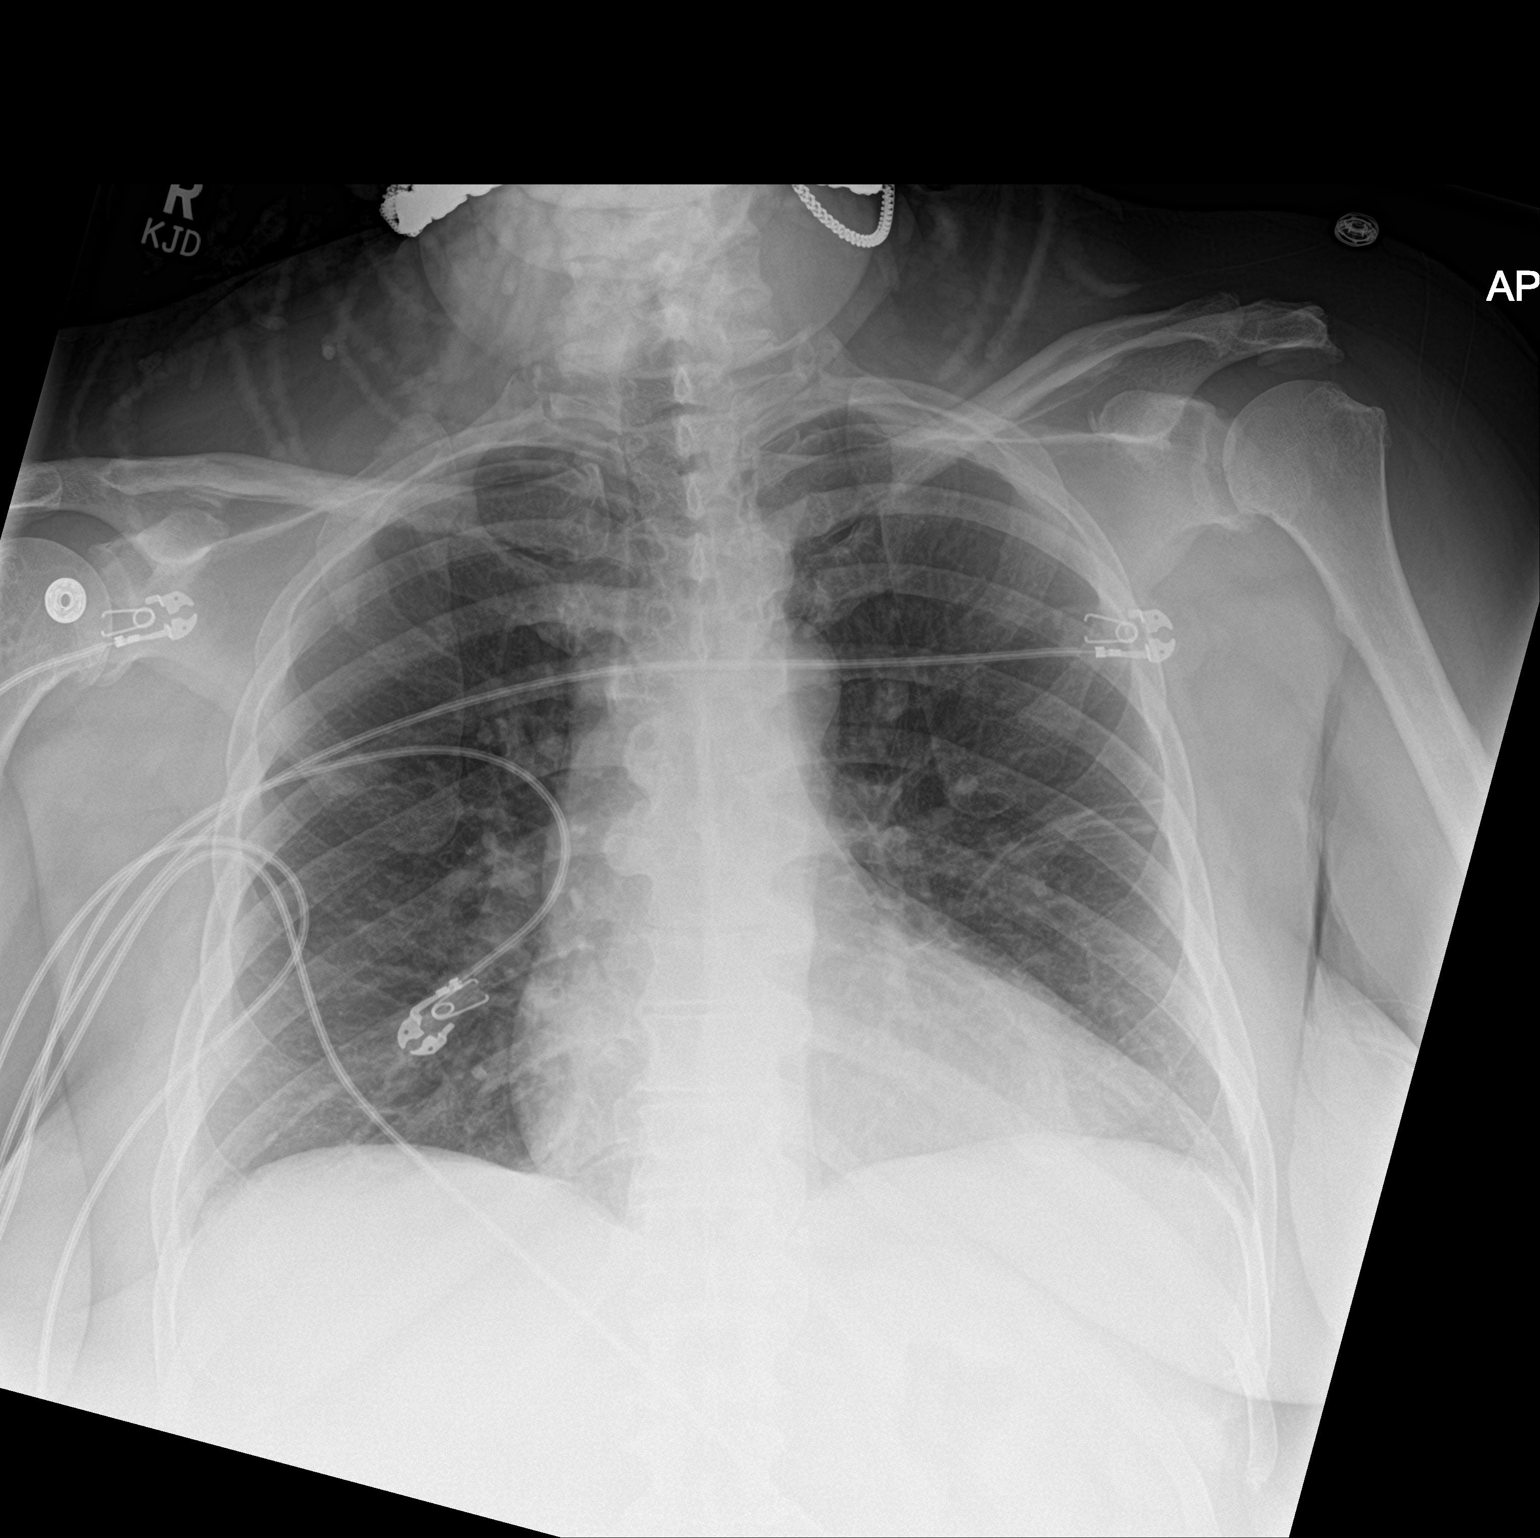

[2 of 2 positions shown; findings below may reference images not displayed]

FINDINGS: Upright AP and lateral views of the chest. Lung volumes and
mediastinal contours are stable and within normal limits. Visualized
tracheal air column is within normal limits. No pneumothorax,
pulmonary edema, pleural effusion or confluent pulmonary opacity.

No acute osseous abnormality identified. Paucity of bowel gas in the
upper abdomen.
IMPRESSION: No acute cardiopulmonary abnormality.

## 2023-01-20 ENCOUNTER — Other Ambulatory Visit (HOSPITAL_COMMUNITY): Payer: Self-pay

## 2023-01-20 MED ORDER — OXYCODONE HCL 10 MG PO TABS
10.0000 mg | ORAL_TABLET | Freq: Four times a day (QID) | ORAL | 0 refills | Status: DC | PRN
  Filled 2023-01-20: qty 60, 15d supply, fill #0

## 2023-02-03 ENCOUNTER — Other Ambulatory Visit (HOSPITAL_COMMUNITY): Payer: Self-pay

## 2023-02-03 MED ORDER — OXYCODONE HCL 10 MG PO TABS
10.0000 mg | ORAL_TABLET | Freq: Four times a day (QID) | ORAL | 0 refills | Status: DC | PRN
  Filled 2023-02-03: qty 60, 15d supply, fill #0

## 2023-02-16 ENCOUNTER — Other Ambulatory Visit: Payer: Self-pay

## 2023-02-16 ENCOUNTER — Other Ambulatory Visit (HOSPITAL_COMMUNITY): Payer: Self-pay

## 2023-02-16 MED ORDER — OXYCODONE HCL 10 MG PO TABS
10.0000 mg | ORAL_TABLET | Freq: Four times a day (QID) | ORAL | 0 refills | Status: DC | PRN
  Filled 2023-02-16 – 2023-02-18 (×2): qty 60, 15d supply, fill #0

## 2023-02-18 ENCOUNTER — Other Ambulatory Visit (HOSPITAL_COMMUNITY): Payer: Self-pay

## 2023-03-02 ENCOUNTER — Other Ambulatory Visit (HOSPITAL_COMMUNITY): Payer: Self-pay

## 2023-03-02 MED ORDER — OXYCODONE HCL 10 MG PO TABS
10.0000 mg | ORAL_TABLET | Freq: Four times a day (QID) | ORAL | 0 refills | Status: DC
  Filled 2023-03-02 – 2023-03-03 (×2): qty 60, 15d supply, fill #0

## 2023-03-03 ENCOUNTER — Other Ambulatory Visit (HOSPITAL_COMMUNITY): Payer: Self-pay

## 2023-03-16 ENCOUNTER — Other Ambulatory Visit (HOSPITAL_COMMUNITY): Payer: Self-pay

## 2023-03-16 MED ORDER — OXYCODONE HCL 10 MG PO TABS
10.0000 mg | ORAL_TABLET | Freq: Four times a day (QID) | ORAL | 0 refills | Status: DC | PRN
  Filled 2023-03-16: qty 120, 30d supply, fill #0

## 2023-03-16 MED ORDER — ERGOCALCIFEROL 1.25 MG (50000 UT) PO CAPS
50000.0000 [IU] | ORAL_CAPSULE | ORAL | 1 refills | Status: DC
  Filled 2023-03-16: qty 12, 84d supply, fill #0

## 2023-03-22 ENCOUNTER — Encounter (HOSPITAL_COMMUNITY): Payer: Self-pay | Admitting: Emergency Medicine

## 2023-03-22 ENCOUNTER — Ambulatory Visit (INDEPENDENT_AMBULATORY_CARE_PROVIDER_SITE_OTHER): Payer: MEDICAID

## 2023-03-22 ENCOUNTER — Other Ambulatory Visit: Payer: Self-pay

## 2023-03-22 ENCOUNTER — Ambulatory Visit (HOSPITAL_COMMUNITY)
Admission: EM | Admit: 2023-03-22 | Discharge: 2023-03-22 | Disposition: A | Discharge status: Home / Self Care | Payer: MEDICAID | Attending: Family Medicine | Admitting: Family Medicine

## 2023-03-22 DIAGNOSIS — M25561 Pain in right knee: Secondary | ICD-10-CM

## 2023-03-22 MED ORDER — KETOROLAC TROMETHAMINE 30 MG/ML IJ SOLN
INTRAMUSCULAR | Status: AC
  Filled 2023-03-22: qty 1

## 2023-03-22 MED ORDER — KETOROLAC TROMETHAMINE 30 MG/ML IJ SOLN
30.0000 mg | Freq: Once | INTRAMUSCULAR | Status: AC
  Administered 2023-03-22: 30 mg via INTRAMUSCULAR

## 2023-03-22 MED ORDER — KETOROLAC TROMETHAMINE 10 MG PO TABS: 10.0000 mg | ORAL_TABLET | Freq: Four times a day (QID) | ORAL | 0 refills | Status: DC | PRN

## 2023-03-22 NOTE — Discharge Instructions (Signed)
The knee x-ray showed a lot of arthritis.  You have been given a shot of Toradol 30 mg today.  Ketorolac 10 mg tablets--take 1 tablet every 6 hours as needed for pain.  This is the same medicine that is in the shot we just gave you  I am glad you have an appointment already with your orthopedist.

## 2023-03-22 NOTE — ED Provider Notes (Signed)
[facility]    CSN: [id number] Arrival date & time: 03/22/23  0911      History   Chief Complaint Chief Complaint  Patient presents with   Knee Pain    HPI Lori Bean is a 53 y.o. female.    Knee Pain  Here for right knee pain.  It began bothering her on July 4.  No trauma or fall.  She takes oxycodone 4 times daily on regular basis for chronic pain.  She is intolerant of aspirin; it makes her throw up and bothers her stomach.  She does NOT have rash or itching when she takes it.  Last EGFR was normal at 112 earlier this year.  Past Medical History:  Diagnosis Date   Abdominal hernia    Bipolar affective disorder (HCC)    Breast pain in female    09-23-2022  per pt right side due to previous chronic cough which resolved   Chronic diastolic CHF (congestive heart failure) (HCC) 2013   followed by dr t. Duke Salvia   Chronic pain syndrome    COPD mixed type [facility])    pulmonologist--- dr Vassie Loll   Depression    GAD (generalized anxiety disorder)    GERD (gastroesophageal reflux disease)    Heart murmur    Hepatic steatosis    followed by GI w/ [facility] ;    liver bx 05-14-2022 mild active steatohepatitis, fibrosis stage 0-1, early stages primary biliary cholangitis   History of acute respiratory failure    multiple admission for  COPd/ asthma exacerbation's w/ failure (09-22-2022  last admission 08-26-2022)   History of DVT (deep vein thrombosis)    08-19-213  RUL thrombosis cephalic vein and 05-08-2012  LUE  superficial thrombus bachial vein PICC line site;  01-30-2014   LLE   History of substance abuse (HCC)    crack/ opiates/ cannibus  (09-23-2022  pt stated last used any type 2016)   Hyperlipidemia, mixed    Hypertension    takes meds   Incontinence of feces with fecal urgency    s/p  stage one sacrum nerve stimulator  09/ 2023 by dr Maisie Fus in office   Lymphedema of both lower extremities 06/07/2018   per pt wraps legs nightly   Morbid  (severe) obesity due to excess calories (HCC)    Neuropathy    diabetic neuropathy and left leg post op lumbar surgery   OA (osteoarthritis)    OSA treated with BiPAP    pulmonologist--- dr Vassie Loll;   per pt uses nightly   Overactive bladder    Schizophrenia (HCC)    Type 2 diabetes mellitus (HCC)    followed by pcp;   (09-23-2022  pt stated she check blood sugar TID, fastering avergae 89-140)    Patient Active Problem List   Diagnosis Date Noted   COPD with acute exacerbation (HCC) 08/27/2022   COPD (chronic obstructive pulmonary disease) (HCC) 08/26/2022   Demand ischemia    Neck pain 10/18/2020   Diabetes mellitus type 2 in obese 06/28/2020   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/2019   COPD mixed type (HCC) 08/17/2018   Sinusitis 07/22/2018   Lymphedema 06/07/2018   Abnormal uterine bleeding (AUB) 12/12/2017   CHF (congestive heart failure) (HCC)    Dyspnea on exertion 05/05/2017   Morbid obesity due to excess calories (HCC) 05/05/2017   Chondromalacia, right knee 10/09/2016   Synovial plica of right knee 10/09/2016   Tear of medial meniscus of right knee, initial encounter 09/28/2016  OSA (obstructive sleep apnea) 09/23/2016   Chronic diastolic heart failure (HCC) 04/22/2016   Cigarette smoker 04/03/2014   Chest pain 04/03/2014   Lumbar spondylosis 12/28/2013   Extrinsic asthma 06/07/2013   Anxiety    Essential hypertension    Bipolar affective disorder (HCC)    GERD (gastroesophageal reflux disease)    Arthritis    Schizophrenia (HCC)    Overactive bladder     Past Surgical History:  Procedure Laterality Date   CHOLECYSTECTOMY, LAPAROSCOPIC  11/09/2007   @[facility]  by dr Ezzard Standing   COLONOSCOPY WITH PROPOFOL N/A 01/23/2016   Procedure: COLONOSCOPY WITH PROPOFOL;  Surgeon: Graylin Shiver, MD;  Location: [facility];  Service: Endoscopy;  Laterality: N/A;   KNEE ARTHROSCOPY WITH MENISCAL REPAIR Right 09/28/2016   Procedure: KNEE ARTHROSCOPY WITH MENISCAL REPAIR;  Surgeon: Jodi Geralds,  MD;  Location: [facility];  Service: Orthopedics;  Laterality: Right;  Right partial meniscectomy and chondroplasty, patellar/femoral joint and medial femoral condyle    LUMBAR LAMINECTOMY/DECOMPRESSION MICRODISCECTOMY  04/01/2012   Procedure: LUMBAR LAMINECTOMY/DECOMPRESSION MICRODISCECTOMY 2 LEVELS;  Surgeon: Reinaldo Meeker, MD;  Location: [facility];  Service: Neurosurgery;  Laterality: Left;  Lumbar four-five, lumbar five sacral one microdiscectomy    LUMBAR WOUND DEBRIDEMENT  04/29/2012   Procedure: LUMBAR WOUND DEBRIDEMENT;  Surgeon: Reinaldo Meeker, MD;  Location: [facility];  Service: Neurosurgery;  Laterality: N/A;  lumbar wound debridement   ORIF ORBITAL FRACTURE Right 1989   per pt has retained hardware   POSTERIOR FUSION LUMBAR SPINE  12/28/2013   @[facility]  by dr Gerlene Fee;   bilateral L5-S1 laminectomy / discectomy/ decompression / fusion   ROTATOR CUFF REPAIR Right    yrs ago   TUBAL LIGATION Bilateral 1999   PPTL    OB History     Gravida  4   Para  2   Term  0   Preterm  2   AB  2   Living  2      SAB  1   IAB  1   Ectopic      Multiple      Live Births  2            Home Medications    Prior to Admission medications   Medication Sig Start Date End Date Taking? Authorizing Provider  ketorolac (TORADOL) 10 MG tablet Take 1 tablet (10 mg total) by mouth every 6 (six) hours as needed (pain). 03/22/23  Yes Zenia Resides, MD  albuterol (PROVENTIL) (2.5 MG/3ML) 0.083% nebulizer solution Take 2.5 mg by nebulization every 6 (six) hours as needed for wheezing or shortness of breath. 08/12/22   [provider]  albuterol (VENTOLIN HFA) 108 (90 Base) MCG/ACT inhaler Inhale 1-2 puffs into the lungs every 6 (six) hours as needed for wheezing or shortness of breath. Patient taking differently: Inhale 1-2 puffs into the lungs every 6 (six) hours as needed for wheezing or shortness of breath. 08/22/21   Olalere, Onnie Boer A, MD  ARIPiprazole (ABILIFY) 20 MG  tablet Take 20 mg by mouth daily. 05/20/21   [provider]  atorvastatin (LIPITOR) 40 MG tablet Take 1 tablet (40 mg total) by mouth daily. Patient taking differently: Take 40 mg by mouth daily. 06/26/22   Alver Sorrow, NP  Budeson-Glycopyrrol-Formoterol (BREZTRI AEROSPHERE) 160-9-4.8 MCG/ACT AERO Inhale 2 puffs into the lungs in the morning and at bedtime. Patient taking differently: Inhale 2 puffs into the lungs in the morning and at bedtime. 08/22/21  Olalere, Adewale A, MD  buPROPion (WELLBUTRIN XL) 150 MG 24 hr tablet Take 150 mg by mouth daily. 11/30/22   [provider]  DULoxetine (CYMBALTA) 20 MG capsule Take 20 mg by mouth 2 (two) times daily.    [provider]  ergocalciferol (VITAMIN D2) 1.25 MG (50000 UT) capsule Take 1 capsule (50,000 Units total) by mouth once a week. Patient taking differently: Take 50,000 Units by mouth once a week. Monday 08/03/22   Courtney Paris, NP  ergocalciferol (VITAMIN D2) 1.25 MG (50000 UT) capsule Take 1 capsule (50,000 Units total) by mouth once a week. 03/16/23     furosemide (LASIX) 40 MG tablet Take 1 tablet (40 mg total) by mouth daily. Please take 40mg  twice daily x2 days, then 40mg  daily 10/05/22 11/09/23  Chilton Si, MD  gabapentin (NEURONTIN) 300 MG capsule Take 300 mg by mouth 3 (three) times daily. Patient not taking: Reported on 03/22/2023 02/02/19   [provider]  glipiZIDE (GLUCOTROL XL) 10 MG 24 hr tablet Take 10 mg by mouth daily with breakfast.    [provider]  hydrOXYzine (ATARAX) 25 MG tablet Take 25 mg by mouth 3 (three) times daily. 12/09/22   [provider]  metFORMIN (GLUCOPHAGE) 1000 MG tablet Take 1,000 mg by mouth 2 (two) times daily with a meal.    [provider]  methocarbamol (ROBAXIN) 500 MG tablet Take 1 tablet (500 mg total) by mouth 3 (three) times daily as needed or muscle spasms Patient taking differently: Take 500 mg by mouth 3 (three) times daily  as needed for muscle spasms. 09/01/22     methocarbamol (ROBAXIN) 500 MG tablet Take 1 tablet (500 mg total) by mouth 3 (three) times daily as needed for muscle spasms. 10/29/22     methocarbamol (ROBAXIN) 500 MG tablet Take 1 tablet (500 mg total) by mouth 3 (three) times daily as needed for muscle spasms. 12/23/22     metoprolol succinate (TOPROL-XL) 50 MG 24 hr tablet Take 50 mg by mouth daily. 05/22/22   [provider]  naloxone [facility]) nasal spray 4 mg/0.1 mL 1 spray every 2 minutes as needed for opioid overdose; spray 1 dose into ONE nostril; alternate nostrils w each dose until help arrives 05/08/22     naloxone [facility]) nasal spray 4 mg/0.1 mL 1 spray every 2 minutes as needed for opioid overdose; spray 1 dose into ONE nostril; alternate nostrils w each dose until help arrives 09/30/22     Oxycodone HCl 10 MG TABS Take 1 tablet (10 mg total) by mouth 5 (five) times daily as needed for pain 01/06/23     Oxycodone HCl 10 MG TABS Take 1 tablet (10 mg total) by mouth 4 (four) times daily as needed. 02/16/23     Oxycodone HCl 10 MG TABS Take 1 tablet (10 mg total) by mouth 4 (four) times daily as needed for pain 03/02/23     Oxycodone HCl 10 MG TABS Take 1 tablet (10 mg total) by mouth 4 (four) times daily as needed for pain 03/16/23     OZEMPIC, 0.25 OR 0.5 MG/DOSE, 2 MG/3ML SOPN Inject 0.5 mg into the skin once a week. Monday's 08/20/22   [provider]  potassium chloride (KLOR-CON) 10 MEQ tablet Take 10 mEq by mouth 2 (two) times daily. 11/25/21   [provider]  tolterodine (DETROL LA) 4 MG 24 hr capsule Take 4 mg by mouth daily. Patient not taking: Reported on 03/22/2023    [provider]  topiramate (TOPAMAX) 100 MG tablet Take by mouth. Patient not taking: Reported on 03/22/2023    [provider]  traMADol (ULTRAM) 50 MG tablet Take 1-2 tablets (50-100 mg total) by mouth every 6 (six) hours as needed. 09/25/22   Romie Levee, MD  Triamcinolone Acetonide  0.025 % LOTN Apply 1 Application topically 2 (two) times daily as needed.    [provider]  ursodiol (ACTIGALL) 300 MG capsule Take 300 mg by mouth 2 (two) times daily. 06/17/22   [provider]  valsartan (DIOVAN) 80 MG tablet Take 1 tablet (80 mg total) by mouth daily. 09/28/22   Chilton Si, MD  Vitamin D, Ergocalciferol, (DRISDOL) 1.25 MG (50000 UNIT) CAPS capsule Take 1 capsule (50,000 Units total) by mouth once a week. 12/23/22       Family History Family History  Problem Relation Age of Onset   Diabetes Mother    Hypertension Mother    Diabetes Father    Heart disease Paternal Aunt    Cancer Paternal Aunt    Esophageal cancer Neg Hx    Stomach cancer Neg Hx     Social History Social History   Tobacco Use   Smoking status: Every Day    Packs/day: 0.50    Years: 25.00    Additional pack years: 0.00    Total pack years: 12.50    Types: Cigarettes    Passive exposure: Current   Smokeless tobacco: Never   Tobacco comments:    09-23-2022  per pt trying to quit again ,  has smoked off and on multiple times  Vaping Use   Vaping Use: Former   Devices: per pt quit 2016  Substance Use Topics   Alcohol use: Not Currently    Comment: quit Nov. 2014   Drug use: Not Currently    Types: Cocaine, "Crack" cocaine, Marijuana    Comment: 09-23-2022  per pt last use of any type 2016     Allergies   Aspirin   Review of Systems Review of Systems   Physical Exam Triage Vital Signs ED Triage Vitals  Enc Vitals Group     BP 03/22/23 1005 (!) 169/79     Pulse Rate 03/22/23 1005 76     Resp 03/22/23 1005 20     Temp 03/22/23 1005 98.2 F (36.8 C)     Temp Source 03/22/23 1005 Oral     SpO2 03/22/23 1005 94 %     Weight --      Height --      Head Circumference --      Peak Flow --      Pain Score 03/22/23 1000 10     Pain Loc --      Pain Edu? --      Excl. in GC? --    No data found.  Updated Vital Signs BP (!) 169/79 (BP Location: Right  Arm) Comment (BP Location): LARGE, FOREARM  Pulse 76   Temp 98.2 F (36.8 C) (Oral)   Resp 20   LMP 10/15/2018   SpO2 94%   Visual Acuity Right Eye Distance:   Left Eye Distance:   Bilateral Distance:    Right Eye Near:   Left Eye Near:    Bilateral Near:     Physical Exam Vitals reviewed.  Constitutional:      General: She is not in acute distress.    Appearance: She is not ill-appearing, toxic-appearing or diaphoretic.  Musculoskeletal:  Comments: There is tenderness of the anterior and posterior right knee there may be some effusion.  No rash or erythema or deformity.  Skin:    Coloration: Skin is not jaundiced or pale.  Neurological:     General: No focal deficit present.     Mental Status: She is alert and oriented to person, place, and time.  Psychiatric:        Behavior: Behavior normal.      UC Treatments / Results  Labs (all labs ordered are listed, but only abnormal results are displayed) Labs Reviewed - No data to display  EKG   Radiology DG Knee AP/LAT W/Sunrise Right  Result Date: 03/22/2023 CLINICAL DATA:  Right knee pain for the past 4 days. No injury. History of prior surgery. EXAM: RIGHT KNEE 3 VIEWS COMPARISON:  Right knee x-rays dated February 26, 2016. FINDINGS: No acute fracture or dislocation. No significant joint effusion. Progressive moderate tricompartmental joint space narrowing and bulky marginal osteophytes. Bone mineralization is normal. Soft tissues are unremarkable. IMPRESSION: 1. Progressive moderate tricompartmental osteoarthritis. Electronically Signed   By: Obie Dredge M.D.   On: 03/22/2023 11:00    Procedures Procedures (including critical care time)  Medications Ordered in UC Medications  ketorolac (TORADOL) 30 MG/ML injection 30 mg (has no administration in time range)    Initial Impression / Assessment and Plan / UC Course  I have reviewed the triage vital signs and the nursing notes.  Pertinent labs & imaging results  that were available during my care of the patient were reviewed by me and considered in my medical decision making (see chart for details).        Xray shows advanced OA. Knee sleeve brace supplied and toradol injection given here. Toradol tabs sent for pain relief, to treat this exacerbation of her right knee pain. Final Clinical Impressions(s) / UC Diagnoses   Final diagnoses:  Acute pain of right knee     Discharge Instructions      The knee x-ray showed a lot of arthritis.  You have been given a shot of Toradol 30 mg today.  Ketorolac 10 mg tablets--take 1 tablet every 6 hours as needed for pain.  This is the same medicine that is in the shot we just gave you  I am glad you have an appointment already with your orthopedist.     ED Prescriptions     Medication Sig Dispense Auth. Provider   ketorolac (TORADOL) 10 MG tablet Take 1 tablet (10 mg total) by mouth every 6 (six) hours as needed (pain). 20 tablet Shukri Nistler, Janace Aris, MD      I have reviewed the PDMP during this encounter.   Zenia Resides, MD 03/22/23 [phone]

## 2023-03-22 NOTE — ED Notes (Signed)
Calling a friend to transport her home

## 2023-03-22 NOTE — ED Triage Notes (Signed)
Pain in right knee started on July 4th.  No known injury.  This knee has been scoped in the past.  Has an appt July 17 for dr Ranell Patrick.  Hurts regardless of position, worse with weight bearing.    Has taken ibuprofen, percocet, tramadol-and nothing is helping.  Pain radiates up and down leg and keeps popping.  Knee in general is painful, not one specific area

## 2023-03-29 ENCOUNTER — Other Ambulatory Visit (HOSPITAL_COMMUNITY): Payer: Self-pay

## 2023-04-13 ENCOUNTER — Other Ambulatory Visit (HOSPITAL_COMMUNITY): Payer: Self-pay

## 2023-04-13 MED ORDER — NALOXONE HCL 4 MG/0.1ML NA LIQD
NASAL | 11 refills | Status: DC
  Filled 2023-04-13: qty 2, 1d supply, fill #0

## 2023-04-13 MED ORDER — OXYCODONE HCL 10 MG PO TABS
10.0000 mg | ORAL_TABLET | Freq: Four times a day (QID) | ORAL | 0 refills | Status: DC
  Filled 2023-04-13: qty 120, 30d supply, fill #0

## 2023-04-16 IMAGING — DX DG CHEST 1V PORT
1 series · 1 of 1 positions shown · non-contrast
Comparison: 12/15/2020

CLINICAL DATA: Cough and shortness of breath

EXAM:
PORTABLE CHEST 1 VIEW

[chest ap]
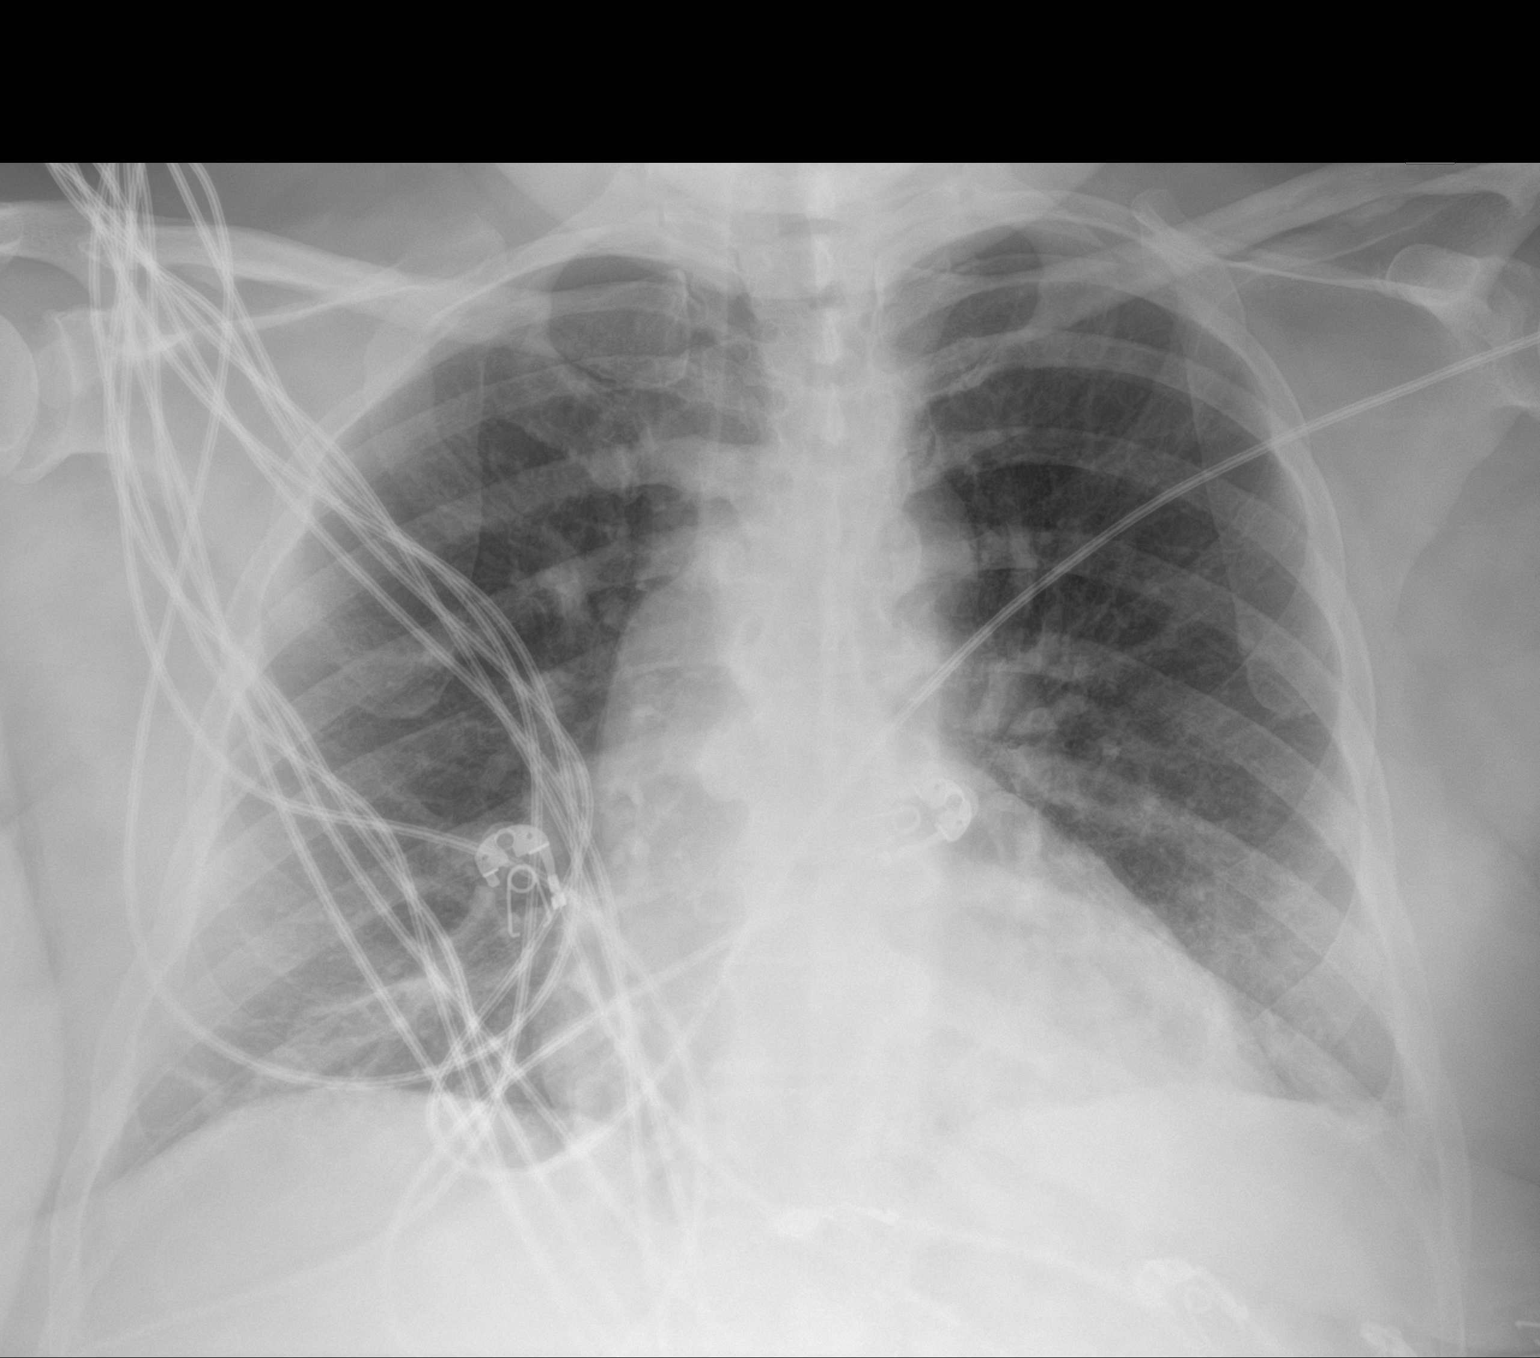

[1 of 1 positions shown; findings below may reference images not displayed]

FINDINGS: Stable thoracic spondylosis. Linear opacities at both lung bases
favor subsegmental atelectasis. Upper normal heart size. The patient
is rotated to the right on today's radiograph, reducing diagnostic
sensitivity and specificity.
IMPRESSION: 1. Indistinct linear opacities at both lung bases favoring
atelectasis.
2. Thoracic spondylosis.

## 2023-04-26 ENCOUNTER — Other Ambulatory Visit (HOSPITAL_COMMUNITY): Payer: Self-pay

## 2023-04-27 ENCOUNTER — Ambulatory Visit: Admission: RE | Admit: 2023-04-27 | Payer: Medicaid Other | Source: Ambulatory Visit

## 2023-04-27 ENCOUNTER — Other Ambulatory Visit: Payer: Self-pay | Admitting: General Surgery

## 2023-04-27 DIAGNOSIS — R152 Fecal urgency: Secondary | ICD-10-CM

## 2023-04-28 ENCOUNTER — Emergency Department (HOSPITAL_COMMUNITY)
Admission: EM | Admit: 2023-04-28 | Discharge: 2023-04-28 | Disposition: A | Discharge status: Home / Self Care | Payer: Medicaid Other | Attending: Emergency Medicine | Admitting: Emergency Medicine

## 2023-04-28 ENCOUNTER — Other Ambulatory Visit: Payer: Self-pay

## 2023-04-28 ENCOUNTER — Encounter (HOSPITAL_COMMUNITY): Payer: Self-pay

## 2023-04-28 ENCOUNTER — Emergency Department (HOSPITAL_COMMUNITY): Payer: Medicaid Other

## 2023-04-28 DIAGNOSIS — R079 Chest pain, unspecified: Secondary | ICD-10-CM | POA: Diagnosis present

## 2023-04-28 DIAGNOSIS — R0602 Shortness of breath: Secondary | ICD-10-CM | POA: Insufficient documentation

## 2023-04-28 DIAGNOSIS — Z1152 Encounter for screening for COVID-19: Secondary | ICD-10-CM | POA: Insufficient documentation

## 2023-04-28 DIAGNOSIS — F17200 Nicotine dependence, unspecified, uncomplicated: Secondary | ICD-10-CM | POA: Insufficient documentation

## 2023-04-28 DIAGNOSIS — Z7951 Long term (current) use of inhaled steroids: Secondary | ICD-10-CM | POA: Insufficient documentation

## 2023-04-28 DIAGNOSIS — J449 Chronic obstructive pulmonary disease, unspecified: Secondary | ICD-10-CM | POA: Insufficient documentation

## 2023-04-28 LAB — BASIC METABOLIC PANEL
Anion gap: 10 (ref 5–15)
BUN: 13 mg/dL (ref 6–20)
CO2: 22 mmol/L (ref 22–32)
Calcium: 8.6 mg/dL — ABNORMAL LOW (ref 8.9–10.3)
Chloride: 104 mmol/L (ref 98–111)
Creatinine, Ser: 0.62 mg/dL (ref 0.44–1.00)
GFR, Estimated: >60 mL/min (ref >60)
Glucose, Bld: 103 mg/dL — ABNORMAL HIGH (ref 70–99)
Potassium: 3.7 mmol/L (ref 3.5–5.1)
Sodium: 136 mmol/L (ref 135–145)

## 2023-04-28 LAB — CBC
HCT: 43.7 % (ref 36.0–46.0)
Hemoglobin: 14.6 g/dL (ref 12.0–15.0)
MCH: 32.1 pg (ref 26.0–34.0)
MCHC: 33.4 g/dL (ref 30.0–36.0)
MCV: 96 fL (ref 80.0–100.0)
Platelets: 180 10*3/uL (ref 150–400)
RBC: 4.55 MIL/uL (ref 3.87–5.11)
RDW: 13.9 % (ref 11.5–15.5)
WBC: 8.9 10*3/uL (ref 4.0–10.5)
nRBC: 0 % (ref 0.0–0.2)

## 2023-04-28 LAB — CBG MONITORING, ED: Glucose-Capillary: 105 mg/dL — ABNORMAL HIGH (ref 70–99)

## 2023-04-28 LAB — BRAIN NATRIURETIC PEPTIDE: B Natriuretic Peptide: 20 pg/mL (ref 0.0–100.0)

## 2023-04-28 LAB — TROPONIN I (HIGH SENSITIVITY)
Troponin I (High Sensitivity): 6 ng/L (ref <18)
Troponin I (High Sensitivity): 5 ng/L (ref <18)

## 2023-04-28 LAB — SARS CORONAVIRUS 2 BY RT PCR: SARS Coronavirus 2 by RT PCR: NEGATIVE

## 2023-04-28 MED ORDER — MORPHINE SULFATE (PF) 4 MG/ML IV SOLN
4.0000 mg | Freq: Once | INTRAVENOUS | Status: AC
  Administered 2023-04-28: 4 mg via INTRAVENOUS
  Filled 2023-04-28: qty 1

## 2023-04-28 NOTE — ED Provider Notes (Signed)
EMERGENCY DEPARTMENT AT [facility] Provider Note   CSN: [id number] Arrival date & time: 04/28/23  1811     History {Add pertinent medical, surgical, social history, OB history to HPI:1} Chief Complaint  Patient presents with   Shortness of Breath   Chest Pain    Lori Bean is a 52 y.o. female.   Shortness of Breath Associated symptoms: chest pain   Chest Pain Associated symptoms: shortness of breath    Pt has history of copd, mi.  Pt still smokes  Pt presents with nausea, shortness of breath, headache, chest pain that started today.  Pt has history of copd.  She is not normally on oxygen.  PT has been coughing.  No fevers.  The chest pain is on both sides of the chest.  Increases with deep breathing.    Home Medications Prior to Admission medications   Medication Sig Start Date End Date Taking? Authorizing Provider  DULoxetine (CYMBALTA) 20 MG capsule Take 20 mg by mouth 2 (two) times daily.   Yes [provider]  gabapentin (NEURONTIN) 300 MG capsule Take 300 mg by mouth 3 (three) times daily. 02/02/19  Yes [provider]  SYMBICORT 160-4.5 MCG/ACT inhaler Inhale 2 puffs into the lungs in the morning and at bedtime. 04/13/23  Yes [provider]  valsartan (DIOVAN) 80 MG tablet Take 1 tablet (80 mg total) by mouth daily. 09/28/22  Yes Chilton Si, MD  albuterol (PROVENTIL) (2.5 MG/3ML) 0.083% nebulizer solution Take 2.5 mg by nebulization every 6 (six) hours as needed for wheezing or shortness of breath. 08/12/22   [provider]  albuterol (VENTOLIN HFA) 108 (90 Base) MCG/ACT inhaler Inhale 1-2 puffs into the lungs every 6 (six) hours as needed for wheezing or shortness of breath. Patient taking differently: Inhale 1-2 puffs into the lungs every 6 (six) hours as needed for wheezing or shortness of breath. 08/22/21   Tomma Lightning, MD  amoxicillin-clavulanate (AUGMENTIN) 875-125 MG tablet Take 1 tablet  by mouth 2 (two) times daily.    [provider]  ARIPiprazole (ABILIFY) 20 MG tablet Take 20 mg by mouth daily. 05/20/21   [provider]  atorvastatin (LIPITOR) 40 MG tablet Take 1 tablet (40 mg total) by mouth daily. Patient taking differently: Take 40 mg by mouth daily. 06/26/22   Alver Sorrow, NP  Budeson-Glycopyrrol-Formoterol (BREZTRI AEROSPHERE) 160-9-4.8 MCG/ACT AERO Inhale 2 puffs into the lungs in the morning and at bedtime. Patient taking differently: Inhale 2 puffs into the lungs in the morning and at bedtime. 08/22/21   Olalere, Onnie Boer A, MD  buPROPion (WELLBUTRIN XL) 150 MG 24 hr tablet Take 150 mg by mouth daily. 11/30/22   [provider]  ergocalciferol (VITAMIN D2) 1.25 MG (50000 UT) capsule Take 1 capsule (50,000 Units total) by mouth once a week. Patient taking differently: Take 50,000 Units by mouth once a week. Monday 08/03/22   Courtney Paris, NP  ergocalciferol (VITAMIN D2) 1.25 MG (50000 UT) capsule Take 1 capsule (50,000 Units total) by mouth once a week. 03/16/23     furosemide (LASIX) 40 MG tablet Take 1 tablet (40 mg total) by mouth daily. Please take 40mg  twice daily x2 days, then 40mg  daily 10/05/22 11/09/23  Chilton Si, MD  glipiZIDE (GLUCOTROL XL) 10 MG 24 hr tablet Take 10 mg by mouth daily with breakfast.    [provider]  hydrOXYzine (ATARAX) 25 MG tablet Take 25 mg by mouth 3 (three) times daily.  12/09/22   [provider]  ketorolac (TORADOL) 10 MG tablet Take 1 tablet (10 mg total) by mouth every 6 (six) hours as needed (pain). 03/22/23   Zenia Resides, MD  metFORMIN (GLUCOPHAGE) 1000 MG tablet Take 1,000 mg by mouth 2 (two) times daily with a meal.    [provider]  methocarbamol (ROBAXIN) 500 MG tablet Take 1 tablet (500 mg total) by mouth 3 (three) times daily as needed or muscle spasms Patient taking differently: Take 500 mg by mouth 3 (three) times daily as needed for muscle spasms. 09/01/22      methocarbamol (ROBAXIN) 500 MG tablet Take 1 tablet (500 mg total) by mouth 3 (three) times daily as needed for muscle spasms. 10/29/22     methocarbamol (ROBAXIN) 500 MG tablet Take 1 tablet (500 mg total) by mouth 3 (three) times daily as needed for muscle spasms. 12/23/22     metoprolol succinate (TOPROL-XL) 50 MG 24 hr tablet Take 50 mg by mouth daily. 05/22/22   [provider]  naloxone [facility]) nasal spray 4 mg/0.1 mL 1 spray every 2 minutes as needed for opioid overdose; spray 1 dose into ONE nostril; alternate nostrils w each dose until help arrives 05/08/22     naloxone [facility]) nasal spray 4 mg/0.1 mL 1 spray every 2 minutes as needed for opioid overdose; spray 1 dose into ONE nostril; alternate nostrils w each dose until help arrives 09/30/22     naloxone [facility]) nasal spray 4 mg/0.1 mL Use 1 spray every 2 minutes as needed for opioid overdose; spray 1 dose into ONE nostril; alternate nostrils w each dose until help arrives 04/13/23     Oxycodone HCl 10 MG TABS Take 1 tablet (10 mg total) by mouth 5 (five) times daily as needed for pain 01/06/23     Oxycodone HCl 10 MG TABS Take 1 tablet (10 mg total) by mouth 4 (four) times daily as needed. 02/16/23     Oxycodone HCl 10 MG TABS Take 1 tablet (10 mg total) by mouth 4 (four) times daily as needed for pain 03/02/23     Oxycodone HCl 10 MG TABS Take 1 tablet (10 mg total) by mouth 4 (four) times daily as needed for pain 03/16/23     Oxycodone HCl 10 MG TABS Take 1 tablet (10 mg total) by mouth 4 (four) times daily as needed for pain 04/13/23     OZEMPIC, 0.25 OR 0.5 MG/DOSE, 2 MG/3ML SOPN Inject 0.5 mg into the skin once a week. Monday's 08/20/22   [provider]  OZEMPIC, 1 MG/DOSE, 4 MG/3ML SOPN Inject 1 mg into the skin daily.    [provider]  potassium chloride (KLOR-CON) 10 MEQ tablet Take 10 mEq by mouth 2 (two) times daily. 11/25/21   [provider]  tolterodine (DETROL LA) 4 MG 24 hr capsule Take 4 mg by  mouth daily. Patient not taking: Reported on 03/22/2023    [provider]  topiramate (TOPAMAX) 100 MG tablet Take by mouth. Patient not taking: Reported on 03/22/2023    [provider]  traMADol (ULTRAM) 50 MG tablet Take 1-2 tablets (50-100 mg total) by mouth every 6 (six) hours as needed. 09/25/22   Romie Levee, MD  Triamcinolone Acetonide 0.025 % LOTN Apply 1 Application topically 2 (two) times daily as needed.    [provider]  ursodiol (ACTIGALL) 300 MG capsule Take 300 mg by mouth 2 (two) times daily. 06/17/22   [provider]  Vitamin D, Ergocalciferol, (DRISDOL) 1.25 MG (50000 UNIT) CAPS capsule Take 1 capsule (50,000 Units total) by mouth once a week. 12/23/22         Allergies    Aspirin    Review of Systems   Review of Systems  Respiratory:  Positive for shortness of breath.   Cardiovascular:  Positive for chest pain.    Physical Exam Updated Vital Signs BP (!) 175/90   Pulse 71   Temp 99.5 F (37.5 C) (Oral)   Resp 14   Ht 1.575 m (5\' 2" )   Wt 114.8 kg   LMP 10/15/2018   SpO2 99%   BMI 46.27 kg/m  Physical Exam  ED Results / Procedures / Treatments   Labs (all labs ordered are listed, but only abnormal results are displayed) Labs Reviewed  BASIC METABOLIC PANEL - Abnormal; Notable for the following components:      Result Value   Glucose, Bld 103 (*)    Calcium 8.6 (*)    All other components within normal limits  CBG MONITORING, ED - Abnormal; Notable for the following components:   Glucose-Capillary 105 (*)    All other components within normal limits  SARS CORONAVIRUS 2 BY RT PCR  CBC  BRAIN NATRIURETIC PEPTIDE  TROPONIN I (HIGH SENSITIVITY)  TROPONIN I (HIGH SENSITIVITY)    EKG EKG Interpretation Date/Time:  Wednesday April 28 2023 20:18:31 EDT Ventricular Rate:  73 PR Interval:  176 QRS Duration:  98 QT Interval:  390 QTC Calculation: 430 R Axis:   5  Text Interpretation: Sinus rhythm Probable  anteroseptal infarct, old Confirmed by Linwood Dibbles [phone]) on 04/28/2023 8:45:15 PM  Radiology DG Chest Portable 1 View  Result Date: 04/28/2023 CLINICAL DATA:  Shortness of breath and chest pain according to epic notes EXAM: PORTABLE CHEST 1 VIEW COMPARISON:  11/23/2022 FINDINGS: Mild cardiomegaly with central vascular congestion. No consolidation, pleural effusion or pneumothorax IMPRESSION: Mild cardiomegaly with central vascular congestion. Electronically Signed   By: Jasmine Pang M.D.   On: 04/28/2023 20:05    Procedures Procedures  {Document cardiac monitor, telemetry assessment procedure when appropriate:1}  Medications Ordered in ED Medications  morphine (PF) 4 MG/ML injection 4 mg (4 mg Intravenous Given 04/28/23 2217)    ED Course/ Medical Decision Making/ A&P Clinical Course as of 04/28/23 2336  Wed Apr 28, 2023  2156 Initial troponin normal.  BNP normal [JK]  2156 Chest x-ray shows cardiomegaly with vascular congestion [JK]  2305 Serial troponins normal.  COVID-negative. [JK]    Clinical Course User Index [JK] Linwood Dibbles, MD   {   Click here for ABCD2, HEART and other calculatorsREFRESH Note before signing :1}          HEART Score: 3                    Medical Decision Making Differential diagnosis includes but not limited to pneumonia pneumothorax acute coronary syndrome noncardiac chest pain  Problems Addressed: Chest pain, unspecified type: acute illness or injury that poses a threat to life or bodily functions  Amount and/or Complexity of Data Reviewed Labs: ordered. Radiology: ordered.  Risk Prescription drug management.   Presented to ED with complaints of chest pain.  ED workup reassuring.  Serial troponins normal.  EKG reassuring.  Doubt acute coronary syndrome  No evidence of pneumonia or pneumothorax.  Patient not having any respiratory difficulty and her oxygen saturation is normal.  Negative for COVID.  Is possible her symptoms may  be related to  her cough and her viral illness.  Patient is feeling better after treatment.  Appears appropriate for discharge and close outpatient follow-up.  Warning signs precautions discussed  {Document critical care time when appropriate:1} {Document review of labs and clinical decision tools ie heart score, Chads2Vasc2 etc:1}  {Document your independent review of radiology images, and any outside records:1} {Document your discussion with family members, caretakers, and with consultants:1} {Document social determinants of health affecting pt's care:1} {Document your decision making why or why not admission, treatments were needed:1} Final Clinical Impression(s) / ED Diagnoses Final diagnoses:  Chest pain, unspecified type    Rx / DC Orders ED Discharge Orders     None

## 2023-04-28 NOTE — ED Triage Notes (Signed)
Pt arrived via ems from home for the c/o SOB and chest pain since this morning. Pt states has worsened throughout the day. Hx chf, copd, not on 02 at home. 02 sat on RA was 89, placed on 4/L [place] for 02 sat of 100%.. 324asp and 0.4mg  nitroglycerin given oral by ems. Pt states medication has helped chest pain. Pt states pain is all over the chest and under the breasts.

## 2023-04-28 NOTE — Discharge Instructions (Addendum)
Follow-up with your doctor to be rechecked in the next day or 2.  Return to the ER for worsening symptoms fevers or chills

## 2023-05-02 IMAGING — CT CT ANGIO HEAD-NECK (W OR W/O PERF)
3 of 7 series · 10 of 36 positions shown · IV contrast (APPLIED)
Comparison: None.

CLINICAL DATA: Unresponsive

EXAM:
CT ANGIOGRAPHY HEAD AND NECK
TECHNIQUE: Multidetector CT imaging of the head and neck was performed using
the standard protocol during bolus administration of intravenous
contrast. Multiplanar CT image reconstructions and MIPs were
obtained to evaluate the vascular anatomy. Carotid stenosis
measurements (when applicable) are obtained utilizing NASCET
criteria, using the distal internal carotid diameter as the
denominator.
CONTRAST:  75mL OMNIPAQUE IOHEXOL 350 MG/ML SOLN

[Series 6: cta neck/head · axial · 0.48mm/px · z∈[-290,-172]mm · 2 of 177 slices shown]
[im 59/177  soft-tissue]
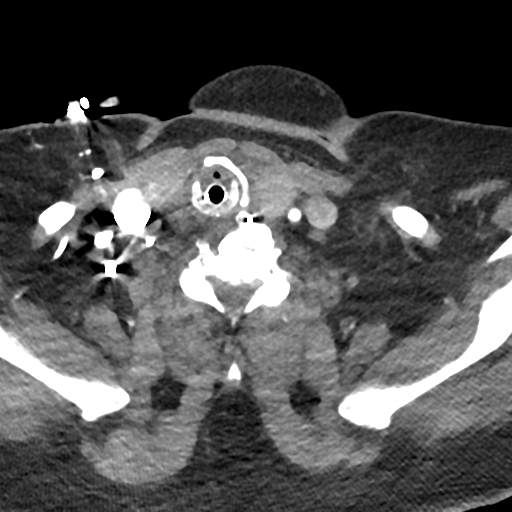
[im 118/177  soft-tissue]
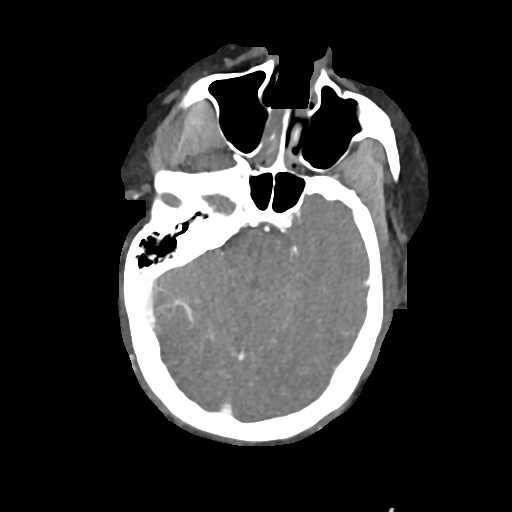

[Series 9: ax thins · axial · 0.39mm/px · z∈[-343,-92]mm · 6 of 354 slices shown]
[im 51/354  soft-tissue]
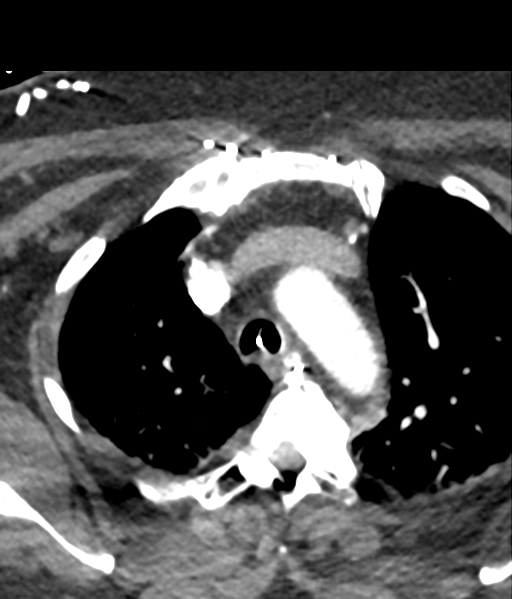
[im 101/354  bone]
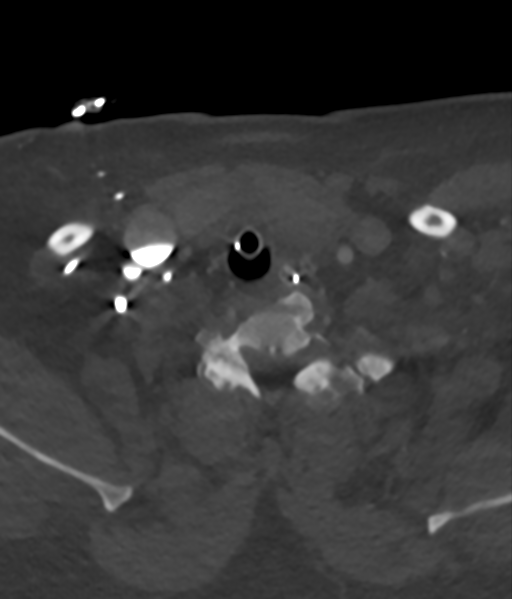
[im 152/354  soft-tissue]
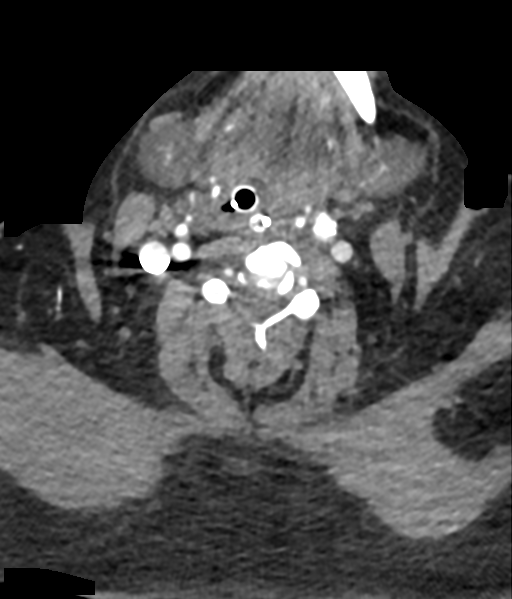
[im 202/354  bone]
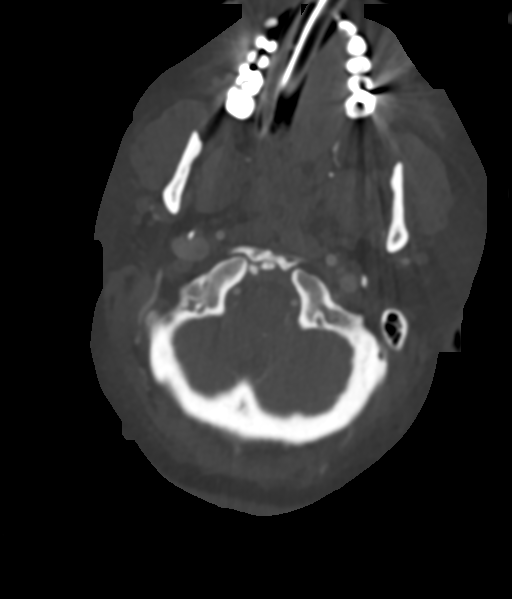
[im 253/354  soft-tissue]
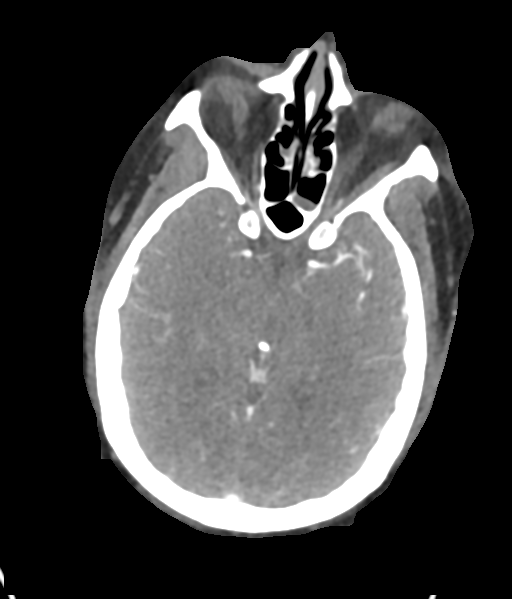
[im 303/354  bone]
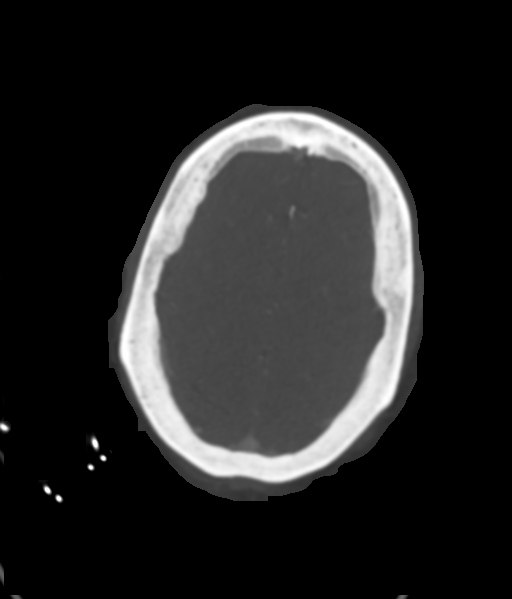

[Series 10: sag thins · sagittal · 0.46mm/px · 2 of 201 slices shown]
[im 58/201  soft-tissue]
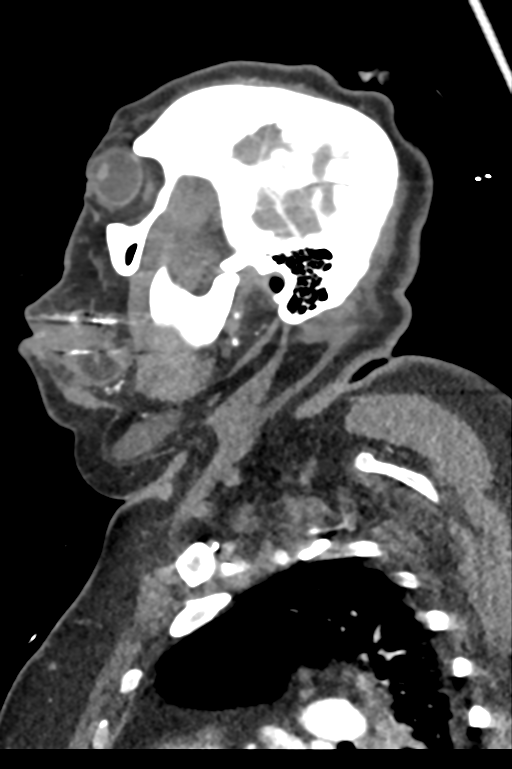
[im 144/201  soft-tissue]
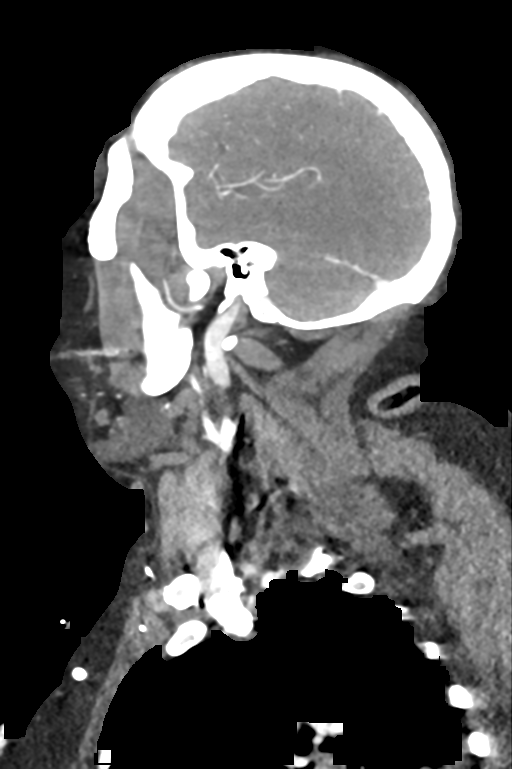

[10 of 36 positions shown; findings below may reference images not displayed]

FINDINGS: CTA NECK

Aortic arch: Great vessel origins are patent.

Right carotid system: Patent. Common carotid is partially obscured
by adjacent venous artifact. No stenosis.

Left carotid system: Patent. Minor plaque along the distal common
carotid. No stenosis.

Vertebral arteries: Patent and codominant.

Skeleton: Cervical spine dictated previously.

Other neck: Enteric and endotracheal tubes are present.

Upper chest: No apical lung mass.

Review of the MIP images confirms the above findings

CTA HEAD

Anterior circulation: Intracranial internal carotid arteries are
patent. Anterior cerebral arteries are patent. Left A1 ACA is
dominant. Anterior communicating artery is present. Middle cerebral
arteries are patent.

Posterior circulation: Intracranial vertebral arteries are patent.
Basilar artery is patent. Major cerebellar artery origins are
patent. Bilateral posterior communicating arteries are present.
There is fetal origin of the right PCA. Posterior cerebral arteries
are patent.

Venous sinuses: Patent as allowed by contrast bolus timing.

Review of the MIP images confirms the above findings
IMPRESSION: No large vessel occlusion, hemodynamically significant stenosis, or
evidence of dissection.

## 2023-05-02 IMAGING — CT CT CERVICAL SPINE W/O CM
3 of 4 series · 12 of 33 positions shown, 14 images · non-contrast
Comparison: None.

CLINICAL DATA: Unresponsive

EXAM:
CT CERVICAL SPINE WITHOUT CONTRAST
TECHNIQUE: Multidetector CT imaging of the cervical spine was performed without
intravenous contrast. Multiplanar CT image reconstructions were also
generated.

[Series 8: sag bone · sagittal · 0.23mm/px · 5 of 61 slices shown, 6 images]
[im 21/61  bone]
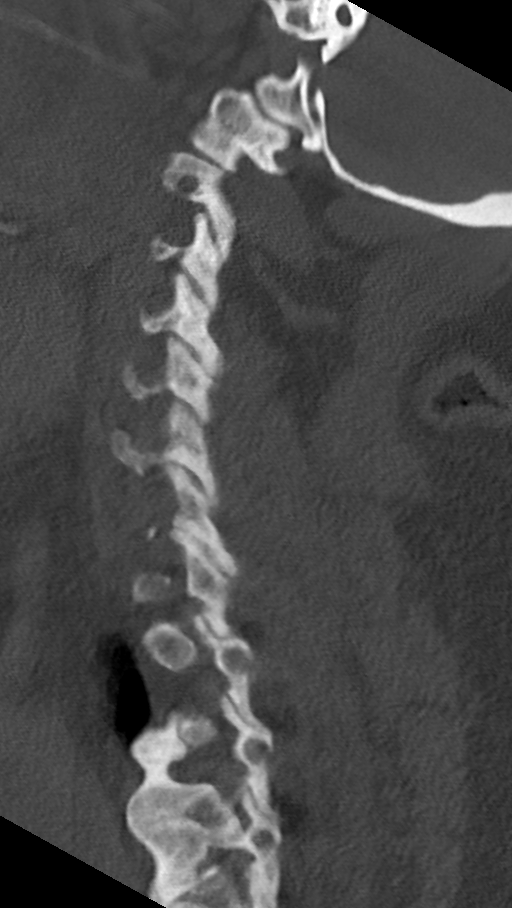
[im 26/61  bone]
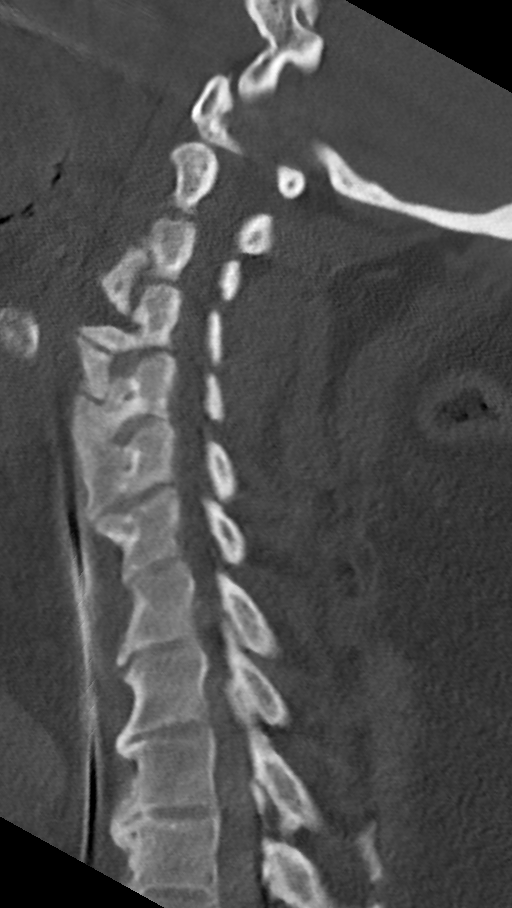
[im 31/61  soft-tissue]
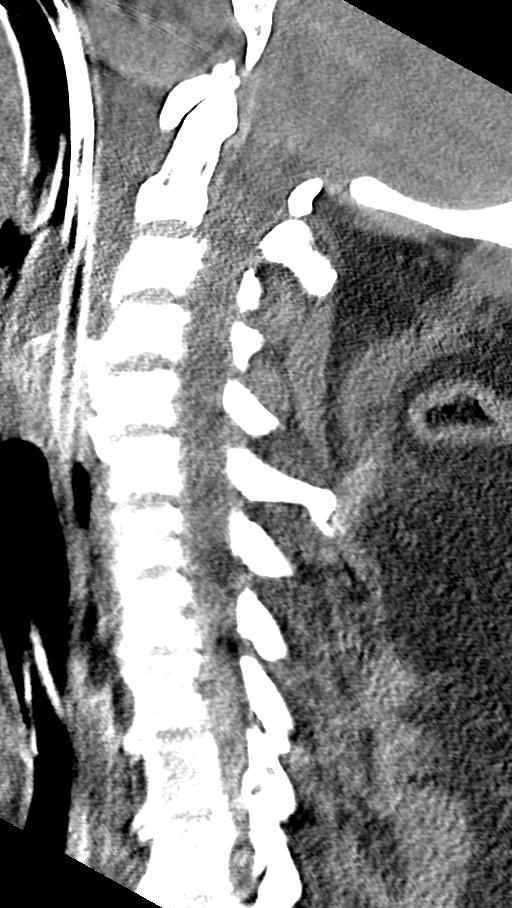
[im 31/61  bone]
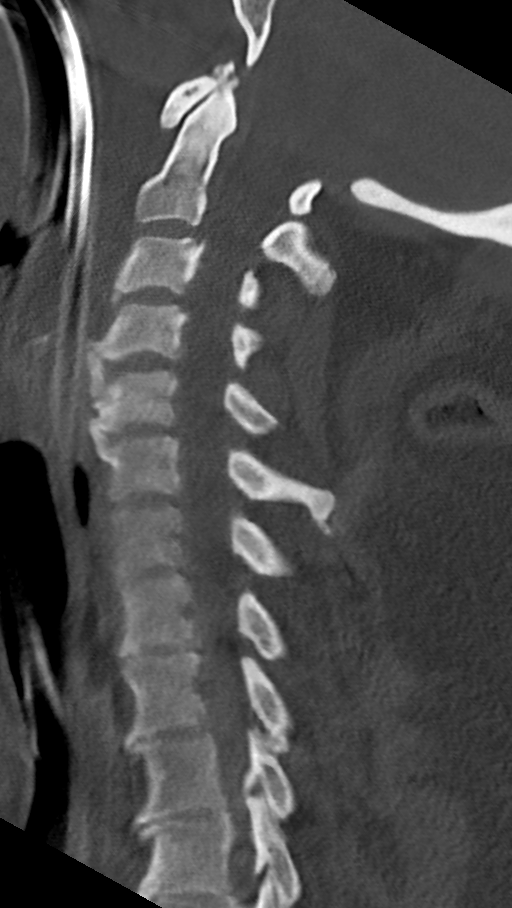
[im 36/61  bone]
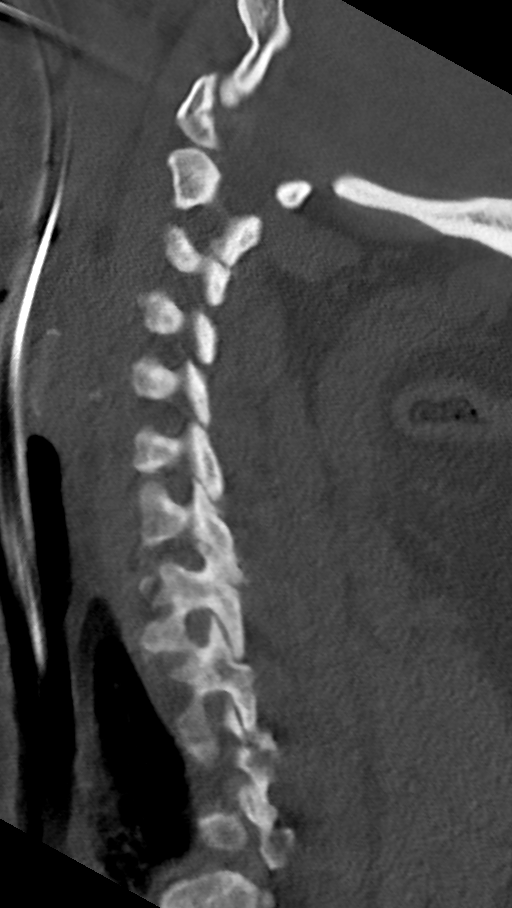
[im 41/61  bone]
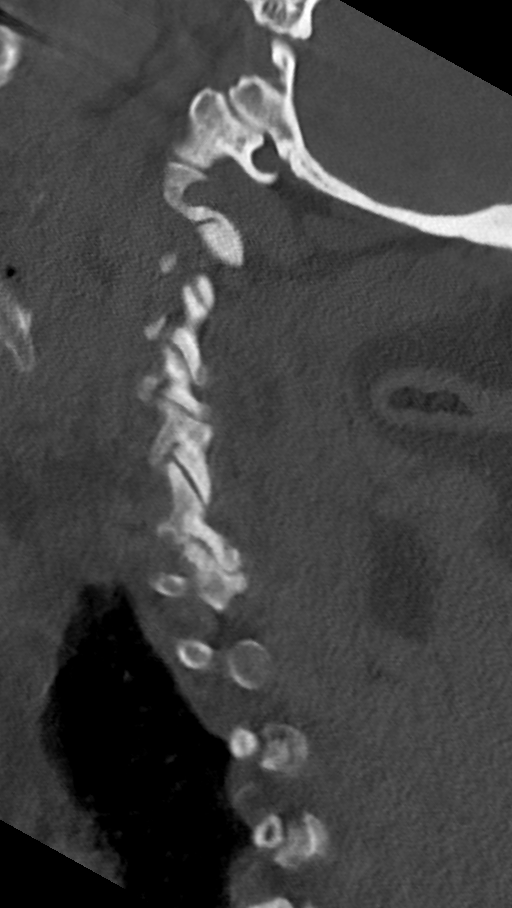

[Series 9: cor bone · coronal · 0.24mm/px · 3 of 58 slices shown]
[im 13/58  bone]
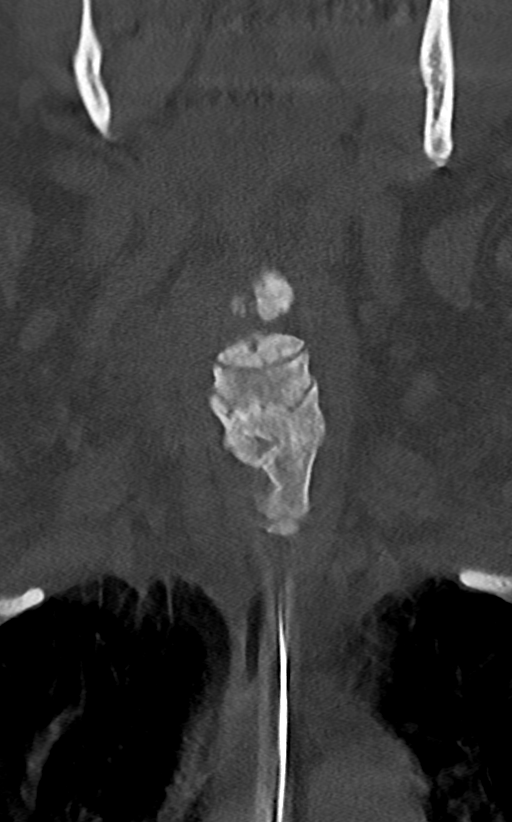
[im 24/58  bone]
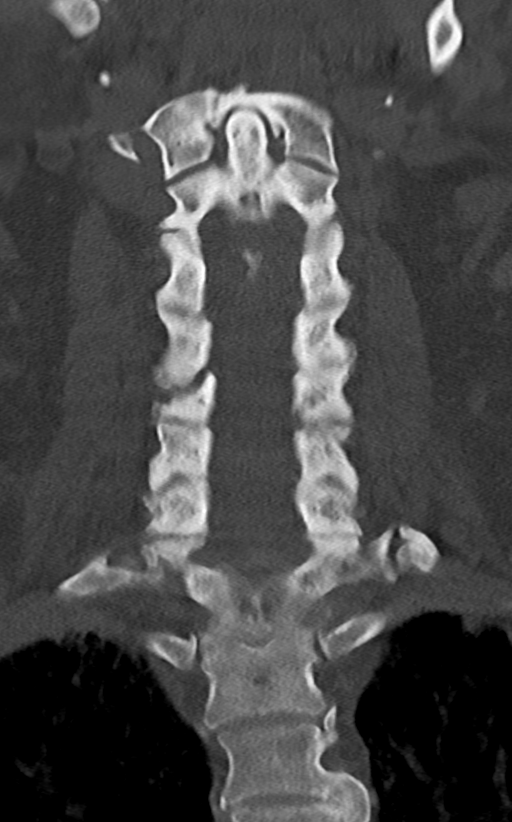
[im 35/58  bone]
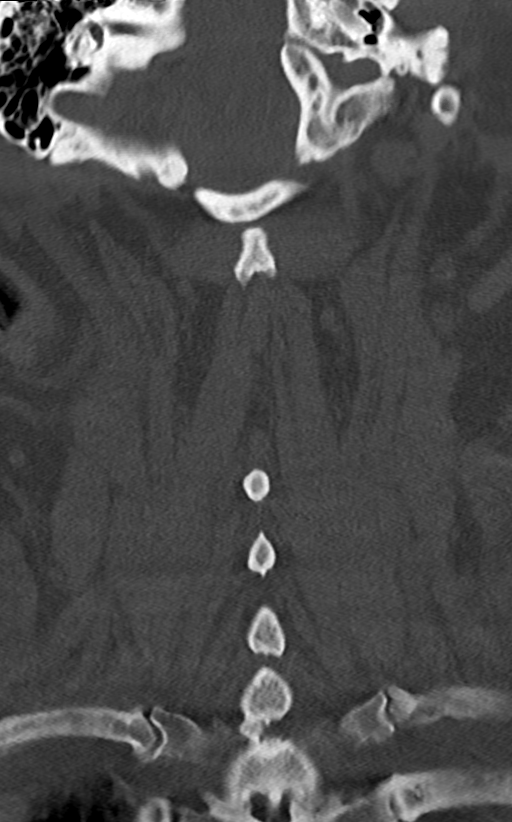

[Series 10: orthogonal axials · axial · 0.21mm/px · z∈[-392,-251]mm · 4 of 108 slices shown, 5 images]
[im 18/108  soft-tissue]
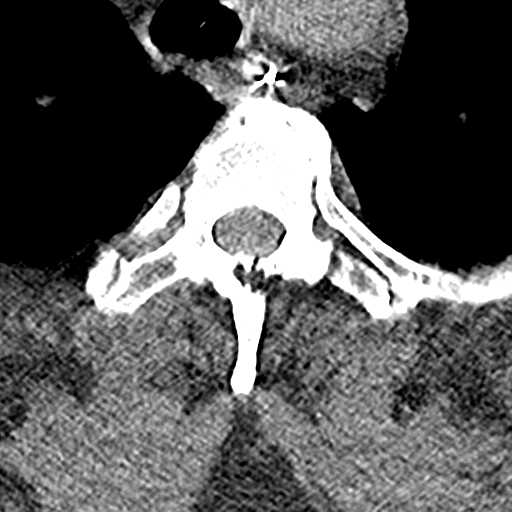
[im 18/108  bone]
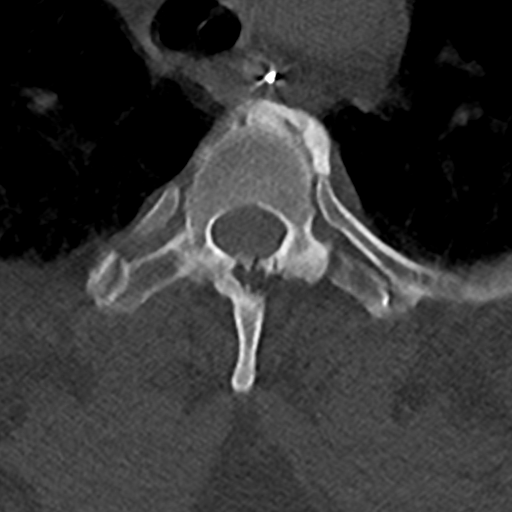
[im 36/108  bone]
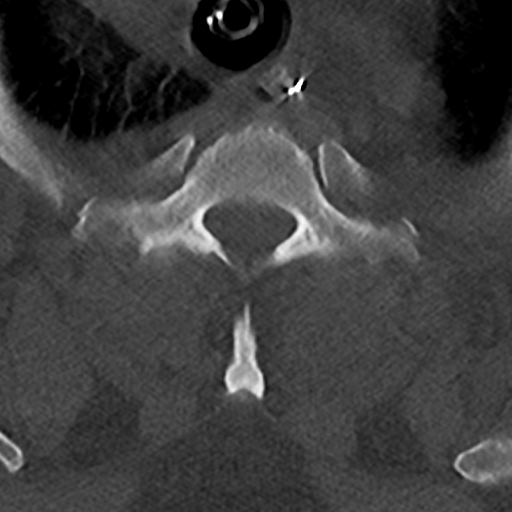
[im 72/108  bone]
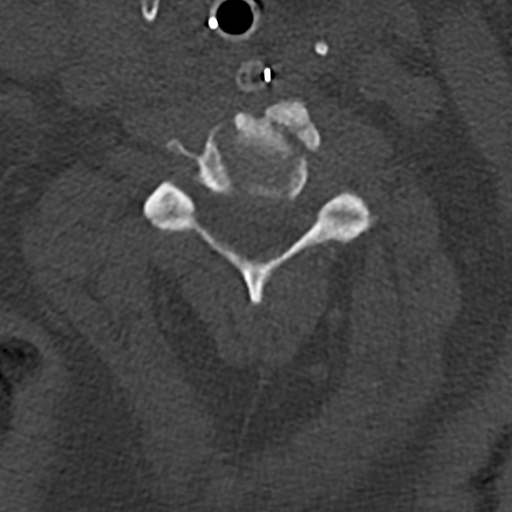
[im 90/108  bone]
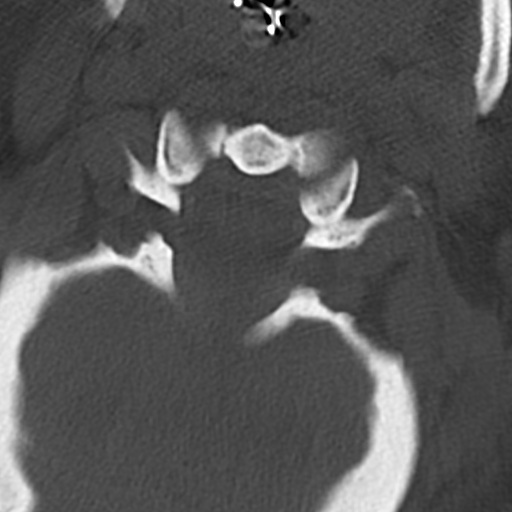

[12 of 33 positions shown; findings below may reference images not displayed]

FINDINGS: Alignment: Preserved.

Skull base and vertebrae: No acute cervical spine fracture.
Vertebral body heights are maintained. Bulky anterior osteophytes.

Soft tissues and spinal canal: No prevertebral fluid or swelling. No
visible canal hematoma.

Disc levels:  Mild degenerative changes are present.

Upper chest: No acute abnormality.

Other: Trace calcified plaque at the left common carotid
bifurcation.
IMPRESSION: No acute cervical spine fracture.

## 2023-05-02 IMAGING — DX DG CHEST 1V PORT
1 series · 1 of 1 positions shown · non-contrast
Comparison: Chest x-ray 03/12/2021.

CLINICAL DATA: 50-year-old female found unresponsive. Status post
endotracheal tube placement.

EXAM:
PORTABLE CHEST 1 VIEW

[chest ap]
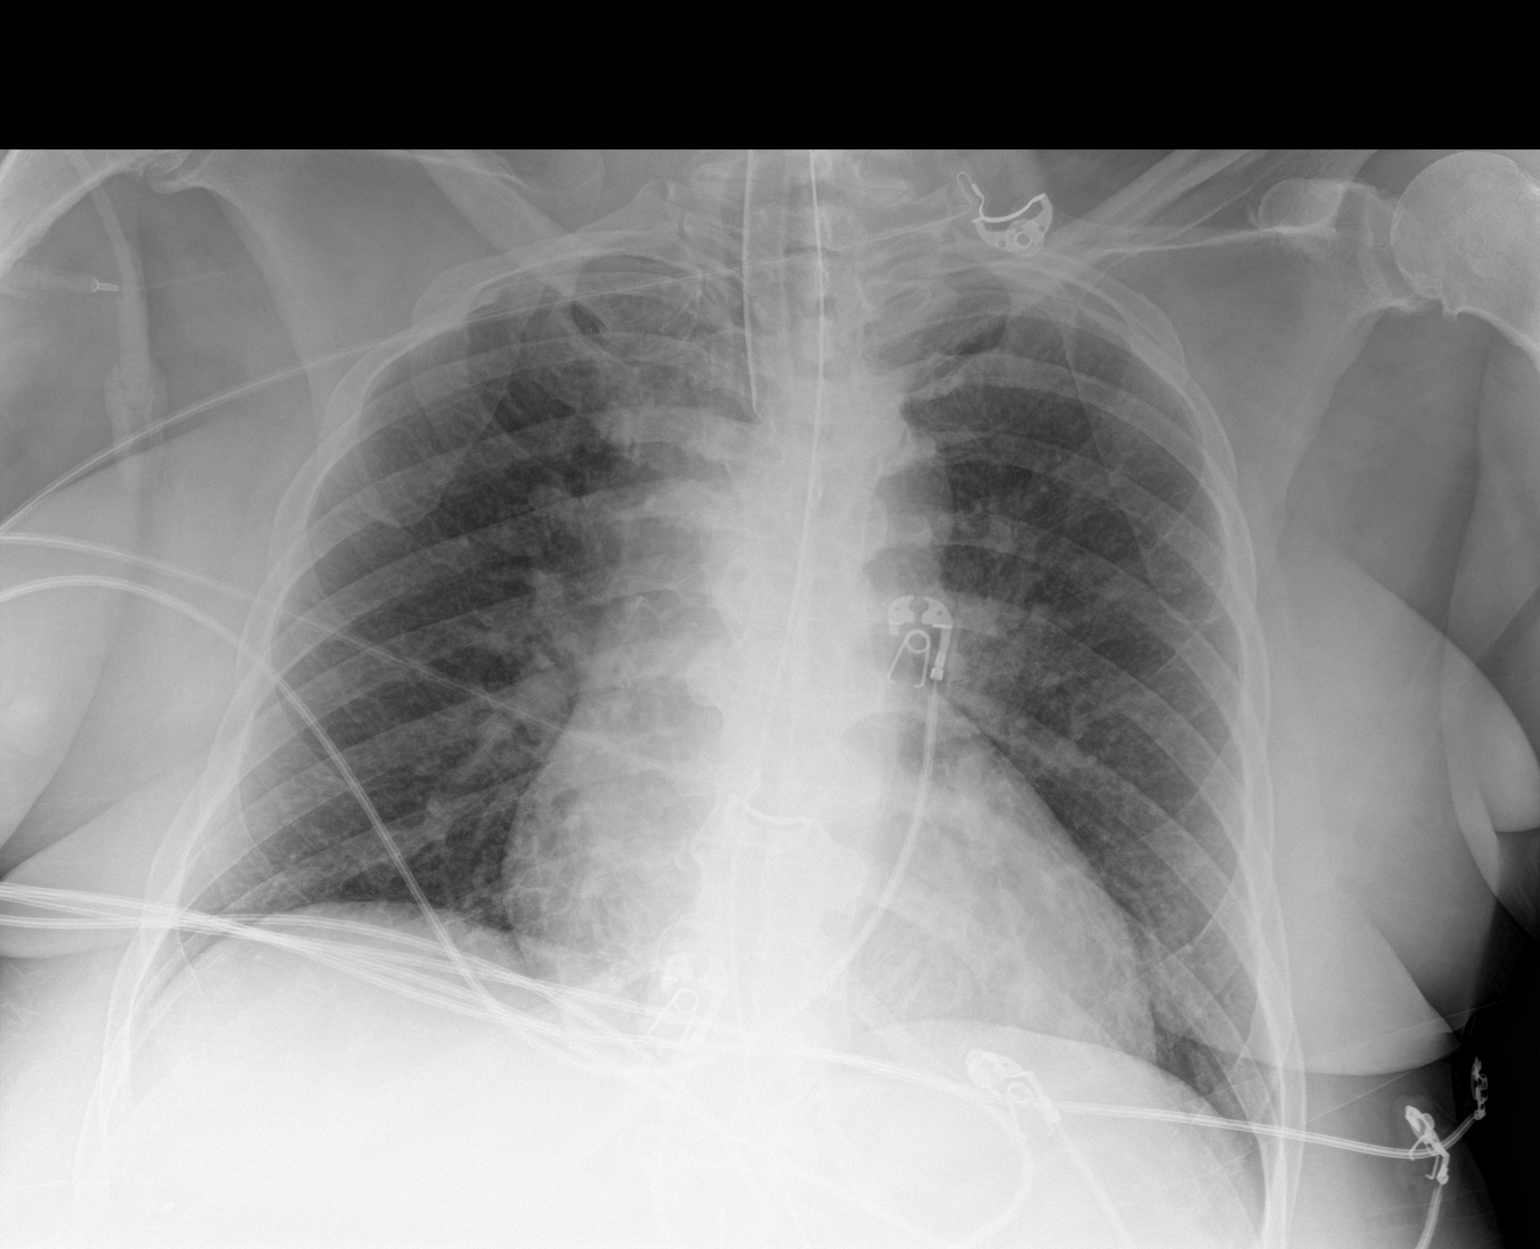

[1 of 1 positions shown; findings below may reference images not displayed]

FINDINGS: An endotracheal tube is in place with tip 4.3 cm above the carina. A
nasogastric tube is seen extending into the stomach, however, the
tip of the nasogastric tube extends below the lower margin of the
image. Lung volumes are low. No consolidative airspace disease. No
pleural effusions. No pneumothorax. No pulmonary nodule or mass
noted. Pulmonary vasculature and the cardiomediastinal silhouette
are within normal limits.
IMPRESSION: 1. Support apparatus, as above.
2. Low lung volumes without radiographic evidence of acute
cardiopulmonary disease.

## 2023-05-02 IMAGING — CT CT HEAD W/O CM
4 series · 17 of 47 positions shown, 19 images · non-contrast
Comparison: 09/20/2018

CLINICAL DATA: Mental status change

EXAM:
CT HEAD WITHOUT CONTRAST
TECHNIQUE: Contiguous axial images were obtained from the base of the skull
through the vertex without intravenous contrast.

[Series 1: head wo · axial · 0.44mm/px · z∈[-229,-109]mm · 7 of 34 slices shown, 9 images]
[im 5/34  brain]
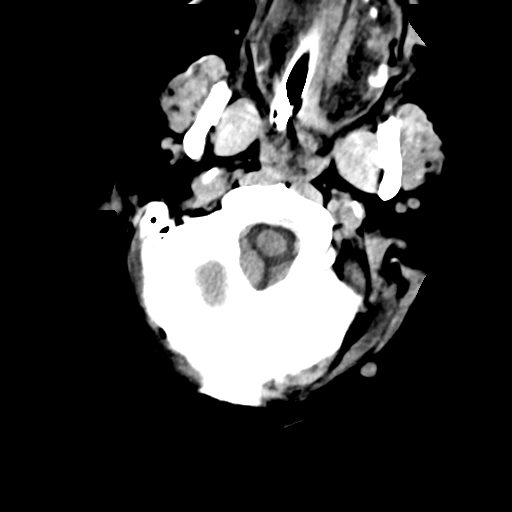
[im 5/34  bone]
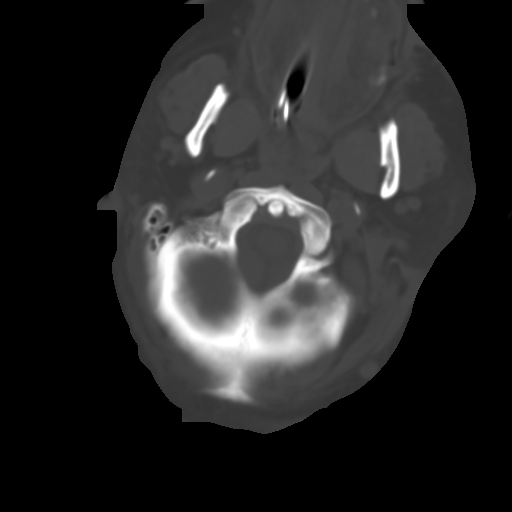
[im 9/34  brain]
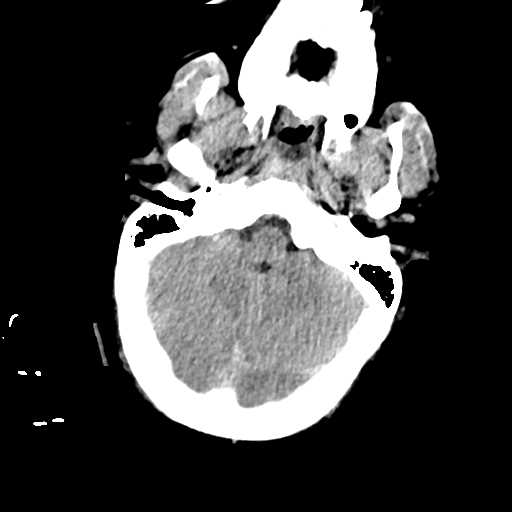
[im 13/34  brain]
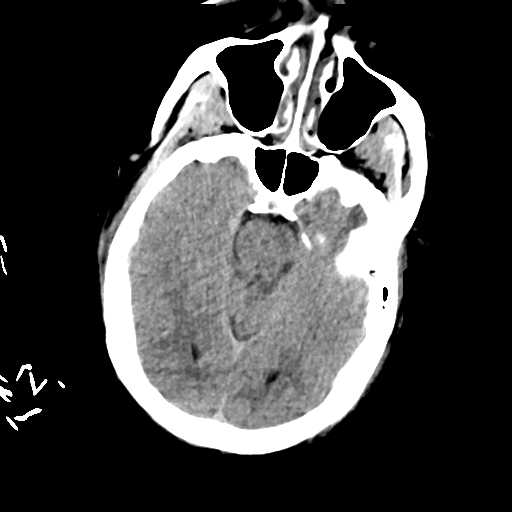
[im 17/34  brain]
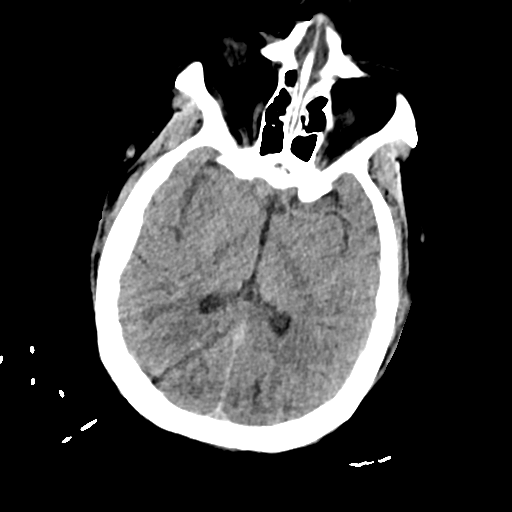
[im 21/34  brain]
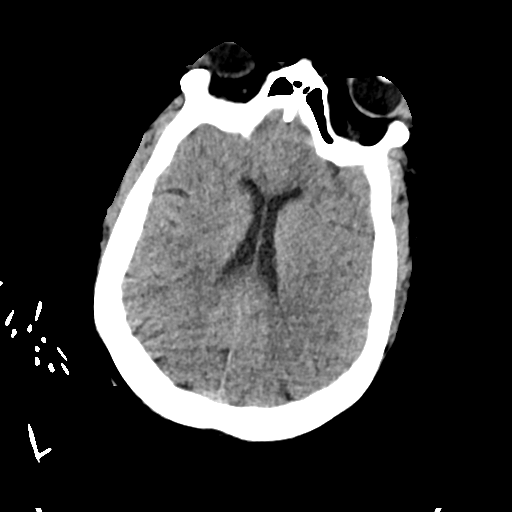
[im 21/34  bone]
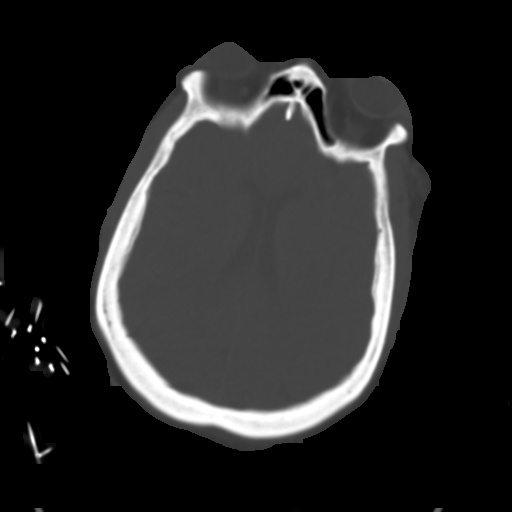
[im 25/34  brain]
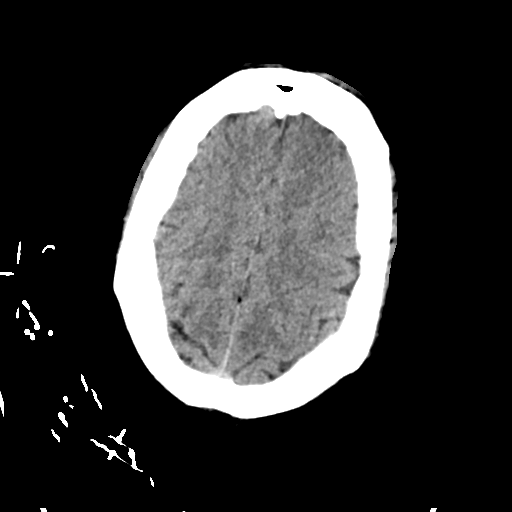
[im 29/34  brain]
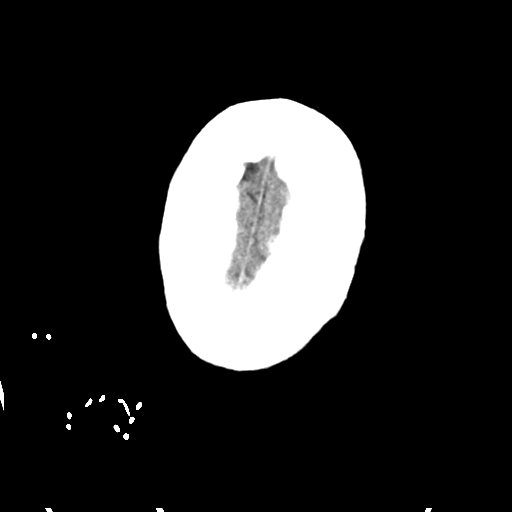

[Series 4: head bone · axial · 0.44mm/px · z∈[-233,-175]mm · 4 of 84 slices shown]
[im 9/84  bone]
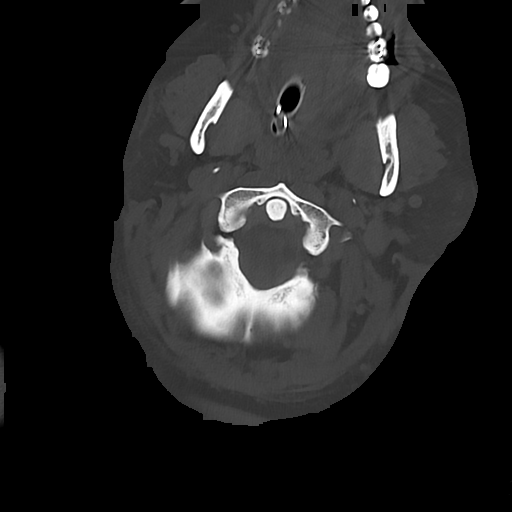
[im 17/84  bone]
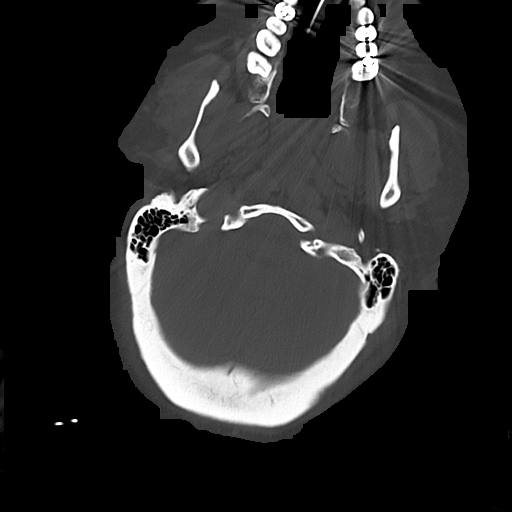
[im 25/84  bone]
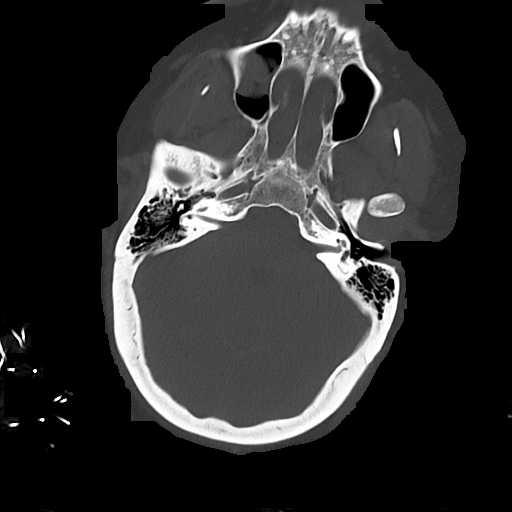
[im 38/84  bone]
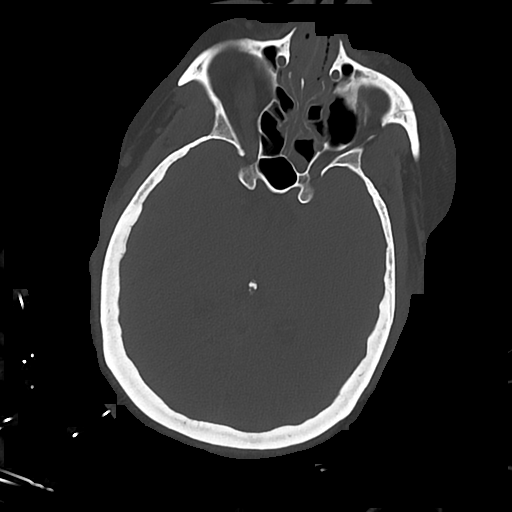

[Series 5: cor soft · coronal · 0.32mm/px · 3 of 70 slices shown]
[im 25/70  brain]
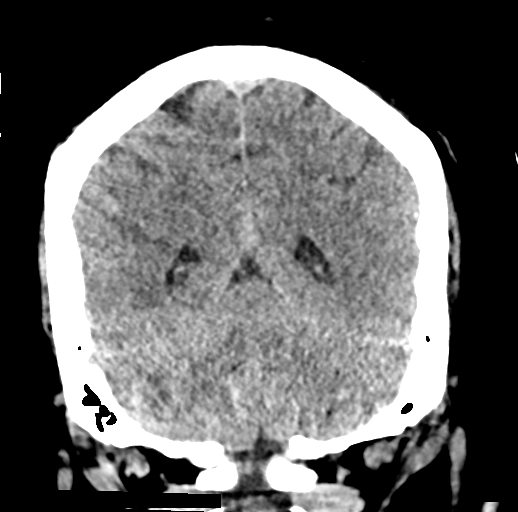
[im 32/70  brain]
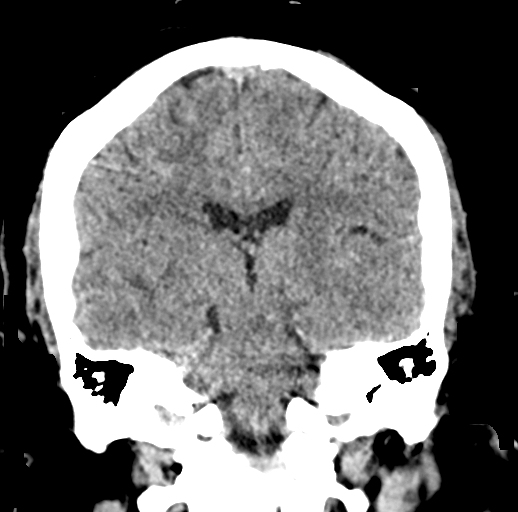
[im 39/70  brain]
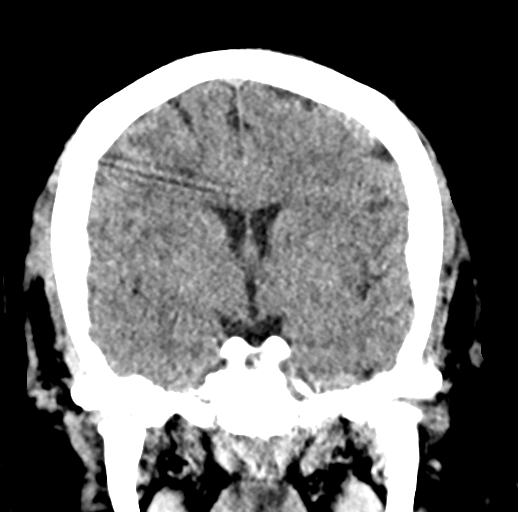

[Series 6: sag soft · sagittal · 0.31mm/px · 3 of 57 slices shown]
[im 19/57  brain]
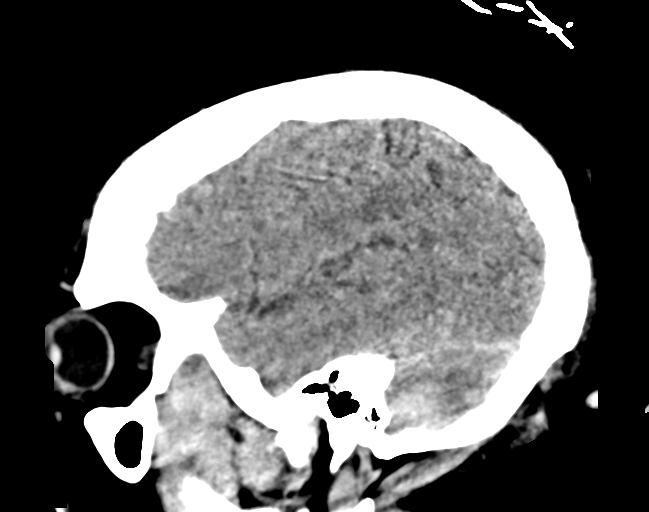
[im 29/57  brain]
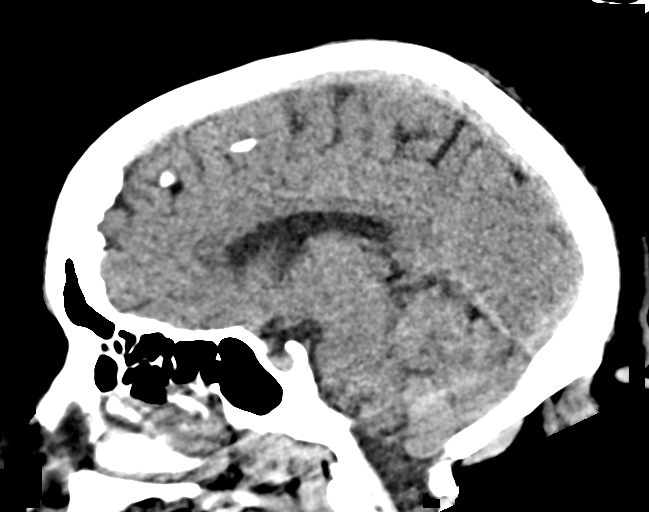
[im 38/57  brain]
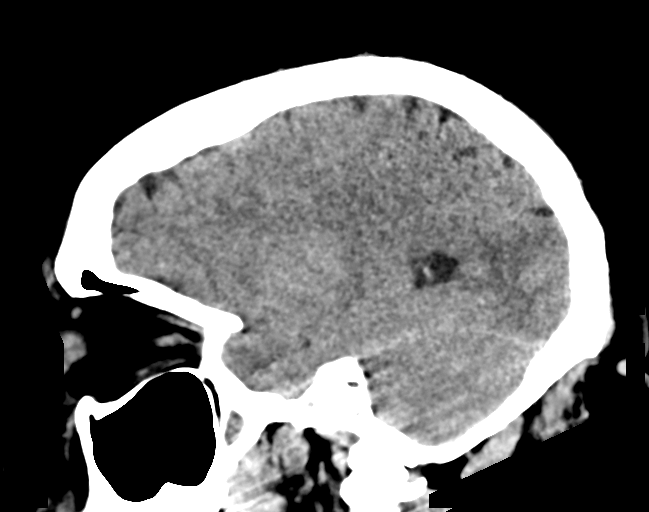

[17 of 47 positions shown; findings below may reference images not displayed]

FINDINGS: Brain: There is no acute intracranial hemorrhage, mass effect, or
edema. Gray-white differentiation is preserved. There is no
extra-axial fluid collection. Ventricles and sulci are within normal
limits in size and configuration.

Vascular: No hyperdense vessel or unexpected calcification.

Skull: Calvarium is unremarkable.

Sinuses/Orbits: Likely odontogenic mild right maxillary sinus
mucosal thickening with posterior right maxillary molar periapical
lucencies. Patchy ethmoid mucosal thickening. Nasal cavity
opacification. No acute orbital abnormality.

Other: Question periorbital soft tissue swelling. Mastoid air cells
are clear.
IMPRESSION: No acute intracranial abnormality.

Question bilateral periorbital soft tissue swelling.

## 2023-05-03 IMAGING — MR MR HEAD WO/W CM
18 of 19 series · 45 of 48 positions shown · IV contrast (Contrast agent)
Comparison: CT head 03/28/2021

CLINICAL DATA: Seizure nontraumatic.

EXAM:
MRI HEAD WITHOUT AND WITH CONTRAST
TECHNIQUE: Multiplanar, multiecho pulse sequences of the brain and surrounding
structures were obtained without and with intravenous contrast.
CONTRAST:  10mL GADAVIST GADOBUTROL 1 MMOL/ML IV SOLN

[Series 5: DWI · axial · 3.0mm · 0.92mm/px · z∈[-83,+61]mm · 6 of 104 slices shown (1 of 4)]
[im 1/104]
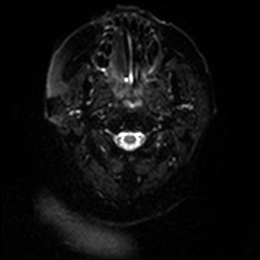
[im 21/104]
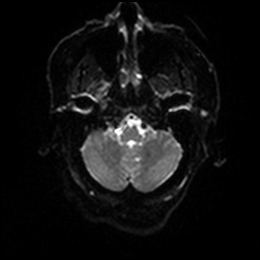
[im 42/104]
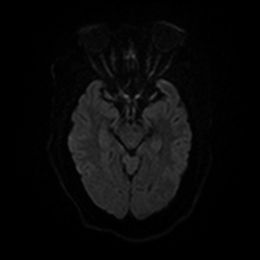
[im 62/104]
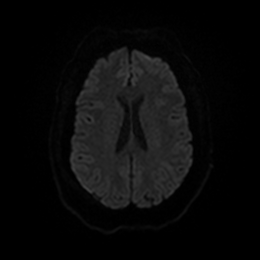
[im 83/104]
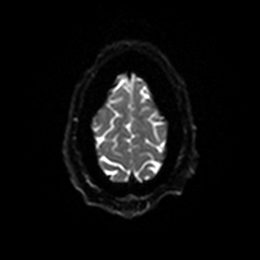
[im 104/104]
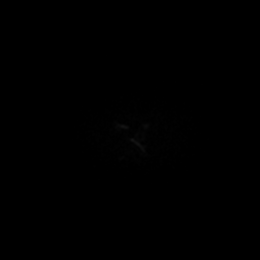

[Series 6: DWI · axial · 3.0mm · 0.92mm/px · z∈[-83,+61]mm · 3 of 52 slices shown (2 of 4)]
[im 1/52]
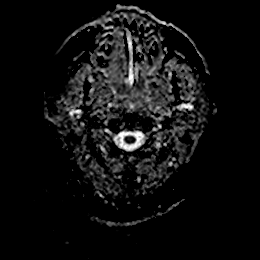
[im 26/52]
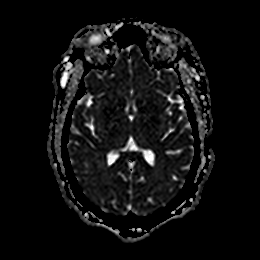
[im 52/52]
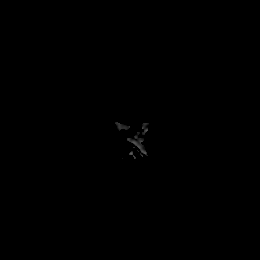

[Series 7: DWI · coronal · 4.0mm · 0.88mm/px · 3 of 64 slices shown (3 of 4)]
[im 1/64]
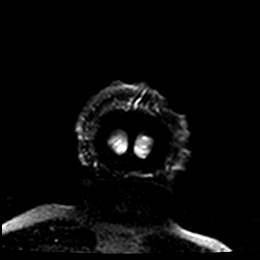
[im 32/64]
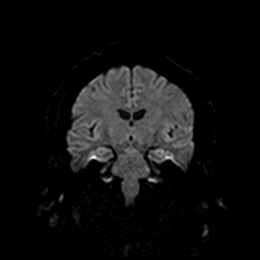
[im 64/64]
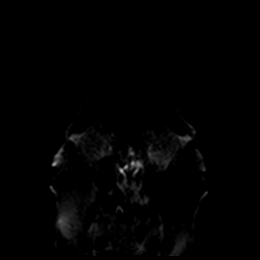

[Series 8: DWI · coronal · 4.0mm · 0.88mm/px · 1 of 32 slices shown (4 of 4)]
[im 1/32]
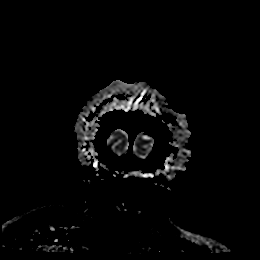

[Series 9: T1 · sagittal · 5.0mm · 0.75mm/px · 1 of 23 slices shown]
[im 1/23]
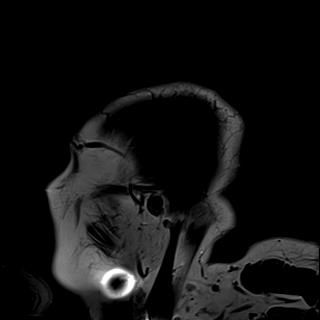

[Series 10: T2 · axial · 5.0mm · 0.72mm/px · 1 of 25 slices shown (1 of 2)]
[im 1/25]
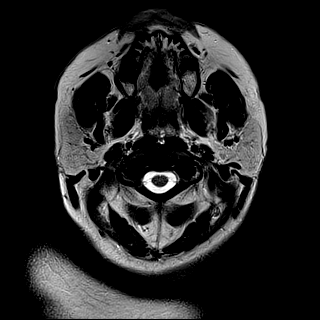

[Series 11: FLAIR · axial · 5.0mm · 0.45mm/px · 1 of 25 slices shown (1 of 2)]
[im 1/25]
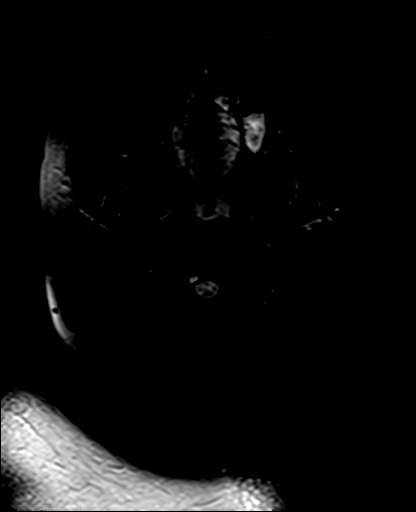

[Series 12: mag_images · axial · 3.0mm · 0.90mm/px · z∈[-80,+81]mm · 3 of 60 slices shown]
[im 1/60]
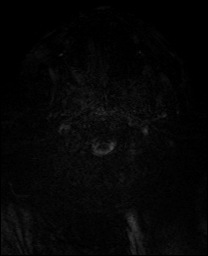
[im 30/60]
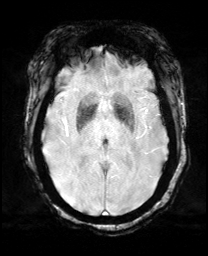
[im 60/60]
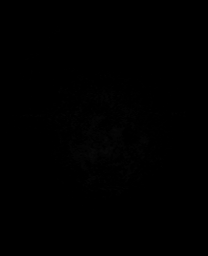

[Series 13: pha_images · axial · 3.0mm · 0.90mm/px · z∈[-80,+81]mm · 3 of 60 slices shown]
[im 1/60]
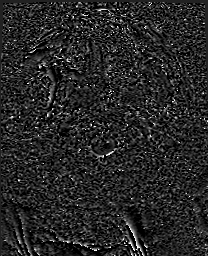
[im 30/60]
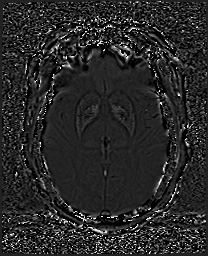
[im 60/60]
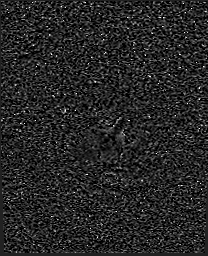

[Series 14: swi_images · axial · 3.0mm · 0.90mm/px · z∈[-80,+81]mm · 3 of 60 slices shown]
[im 1/60]
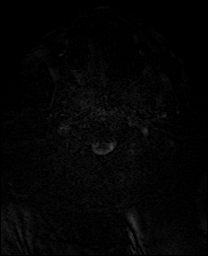
[im 30/60]
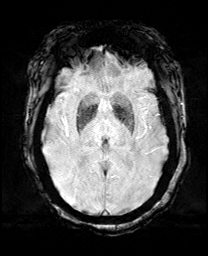
[im 60/60]
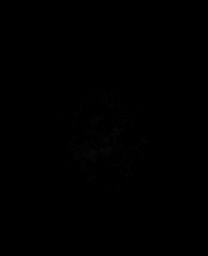

[Series 15: mip_images(sw) · axial · 24.0mm · 0.90mm/px · z∈[-71,+71]mm · 2 of 53 slices shown]
[im 1/53]
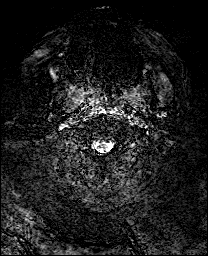
[im 53/53]
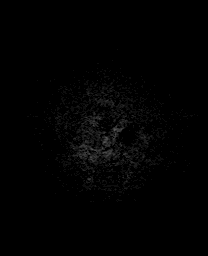

[Series 16: t1_mprage_tra_p2_iso · axial · 1.0mm · 0.98mm/px · z∈[-75,+89]mm · 8 of 176 slices shown]
[im 1/176]
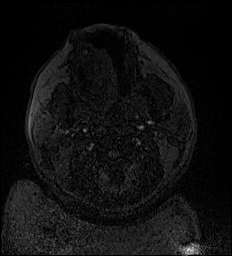
[im 26/176]
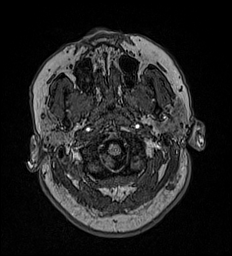
[im 51/176]
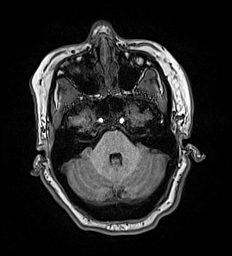
[im 76/176]
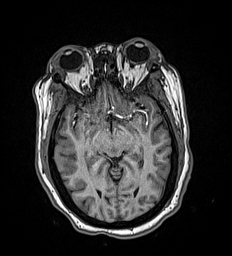
[im 101/176]
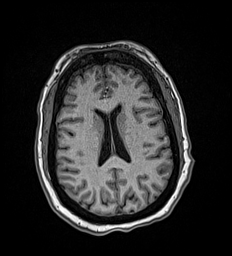
[im 126/176]
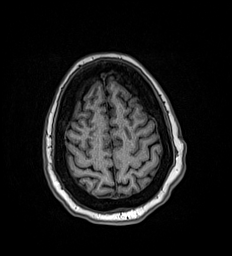
[im 151/176]
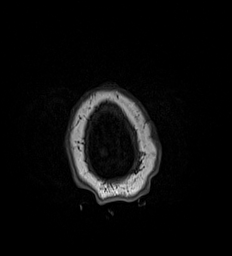
[im 176/176]
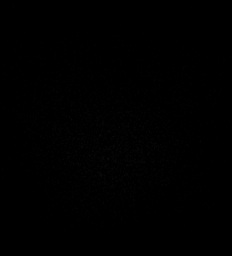

[Series 17: t1_mprage_tra_p2_iso_mpr_coronal · coronal · 1.0mm · 0.45mm/px · 5 of 120 slices shown]
[im 1/120]
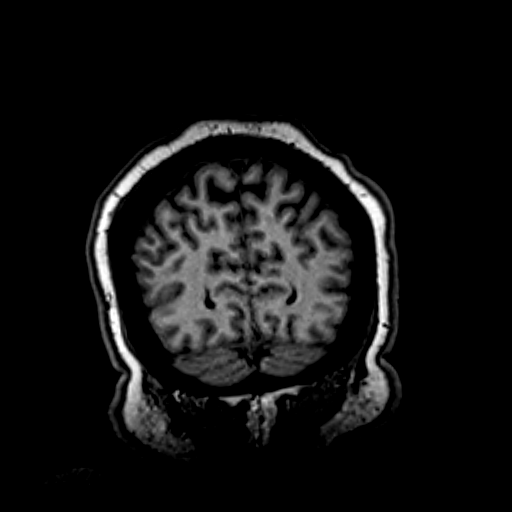
[im 30/120]
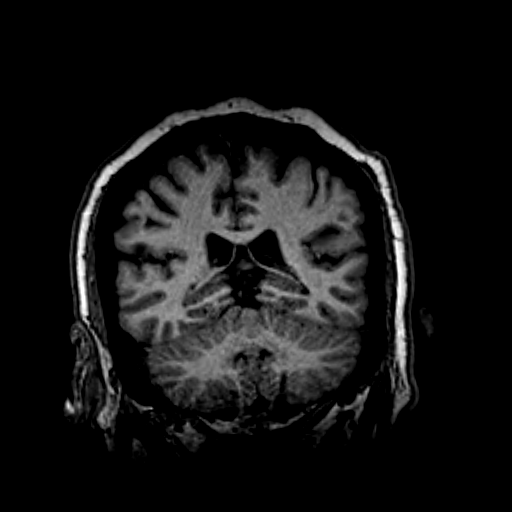
[im 60/120]
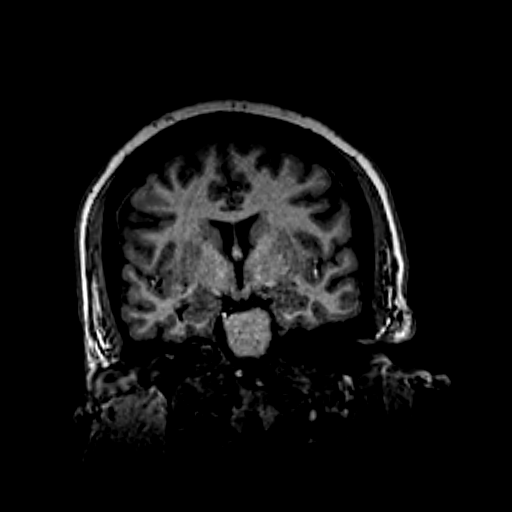
[im 90/120]
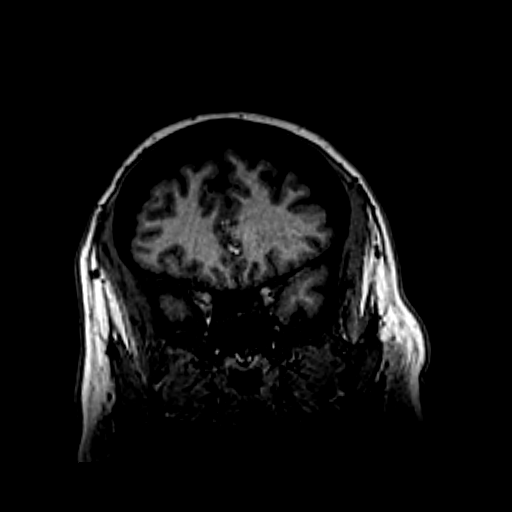
[im 120/120]
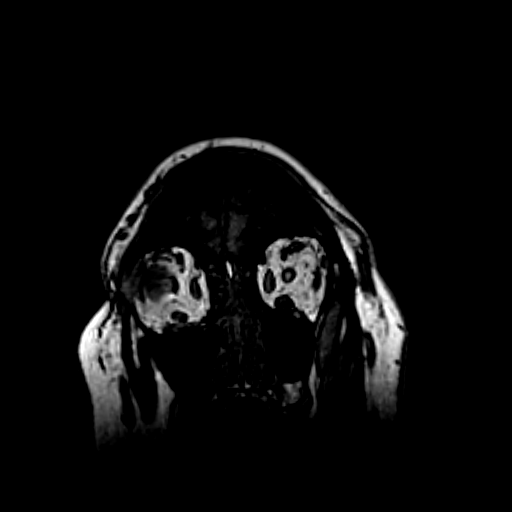

[Series 18: T2 · coronal · 3.0mm · 0.27mm/px · 1 of 32 slices shown (2 of 2)]
[im 1/32]
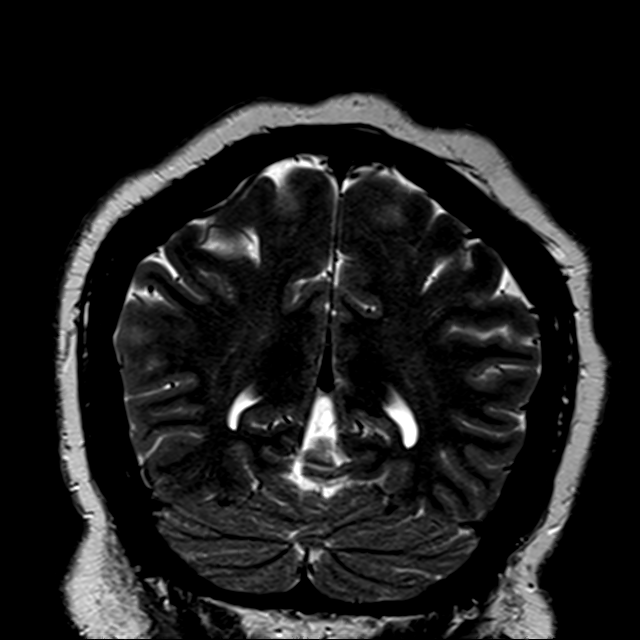

[Series 20: T2 post-contrast · coronal · 5.0mm · 0.72mm/px · 1 of 28 slices shown]
[im 1/28]
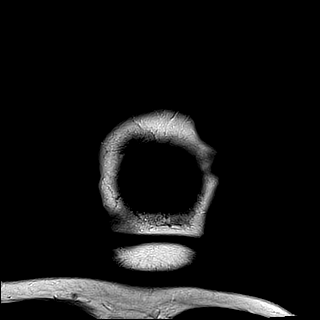

[Series 21: FLAIR · coronal · 3.0mm · 0.56mm/px · 1 of 32 slices shown (2 of 2)]
[im 1/32]
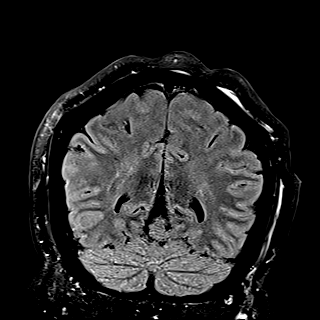

[Series 23: T1 post-contrast · coronal · 5.0mm · 0.34mm/px · 1 of 28 slices shown (1 of 2)]
[im 1/28]
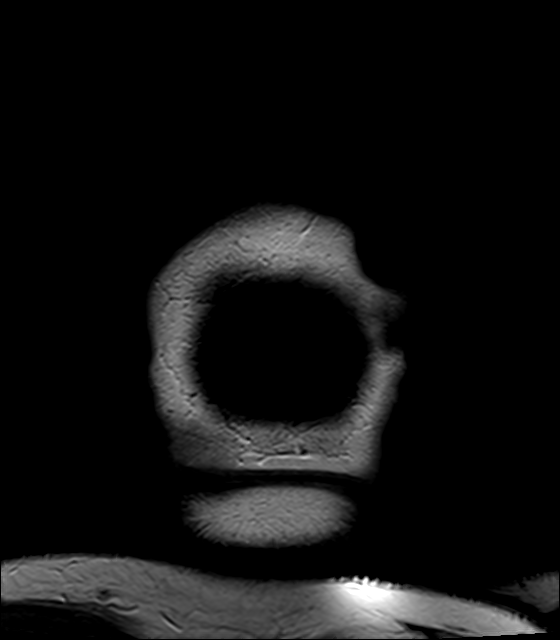

[Series 24: T1 post-contrast · sagittal · 5.0mm · 0.72mm/px · 1 of 23 slices shown (2 of 2)]
[im 1/23]
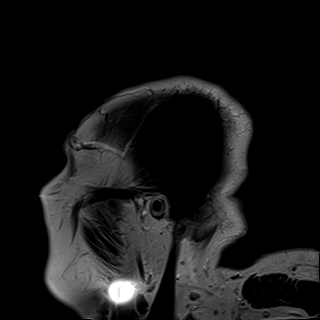

[45 of 48 positions shown; findings below may reference images not displayed]

FINDINGS: Brain: No acute infarction, hemorrhage, hydrocephalus, extra-axial
collection or mass lesion. Normal cerebral white matter. Normal
temporal lobe anatomy and signal. Normal enhancement following
contrast infusion.

Vascular: Normal arterial flow voids.

Skull and upper cervical spine: Negative

Sinuses/Orbits: Mucosal edema paranasal sinuses. Air-fluid levels in
the sphenoid sinus. Mild mastoid effusion bilaterally

Other: None
IMPRESSION: Normal MRI brain with contrast

Mucosal edema in the paranasal sinuses and mastoid sinus
bilaterally.

## 2023-05-03 IMAGING — CT CT CHEST-ABD-PELV W/O CM
2 of 5 series · 16 of 46 positions shown, 18 images · non-contrast
Comparison: Chest radiograph dated 03/28/2021. CT abdomen/pelvis
dated 04/26/2020. CTA chest dated 08/28/2019.

CLINICAL DATA: Sepsis

EXAM:
CT CHEST, ABDOMEN AND PELVIS WITHOUT CONTRAST
TECHNIQUE: Multidetector CT imaging of the chest, abdomen and pelvis was
performed following the standard protocol without IV contrast.

[Series 5: cap w/o 3.0 mm st cor · coronal · non-contrast · 1.01mm/px · 3 of 129 slices shown]
[im 43/129  soft-tissue]
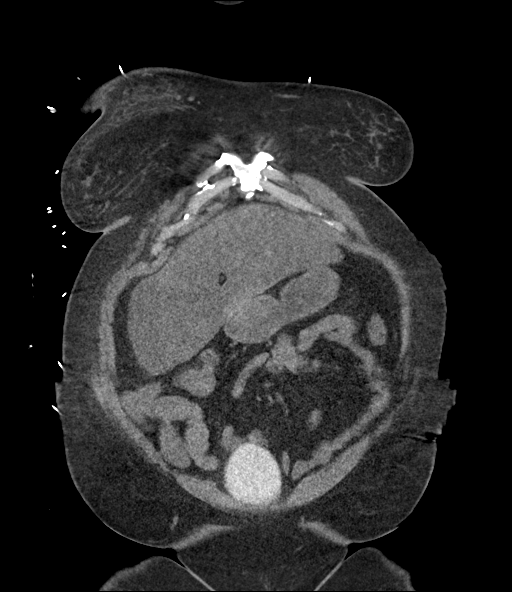
[im 57/129  soft-tissue]
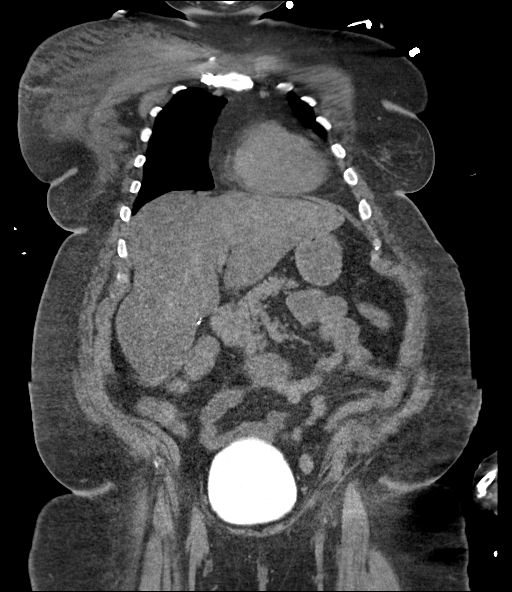
[im 72/129  soft-tissue]
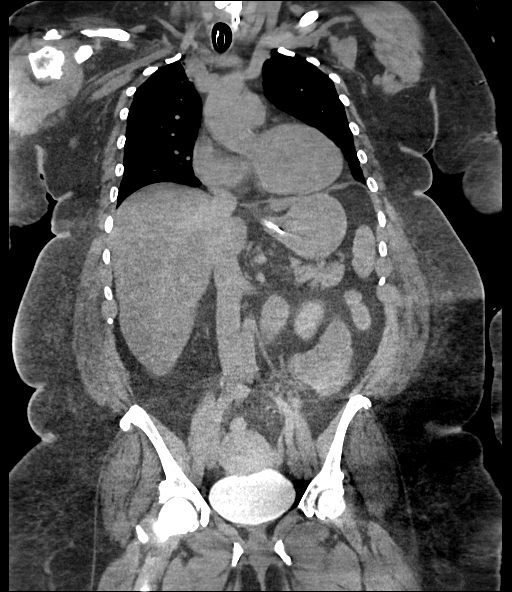

[Series 7: cap w/o 2.0 mm st · axial · non-contrast · 0.98mm/px · z∈[-558,-34]mm · 13 of 300 slices shown, 15 images]
[im 19/300  soft-tissue]
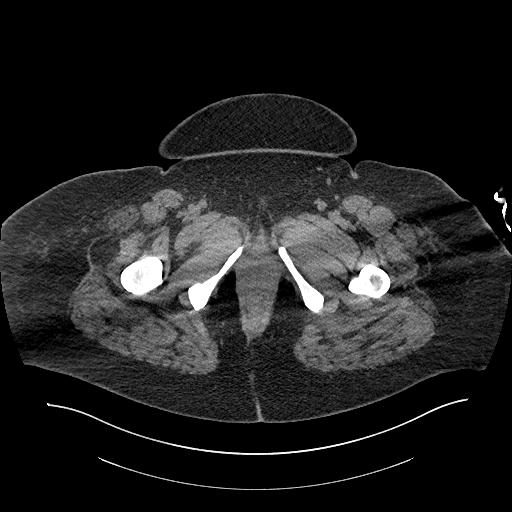
[im 19/300  bone]
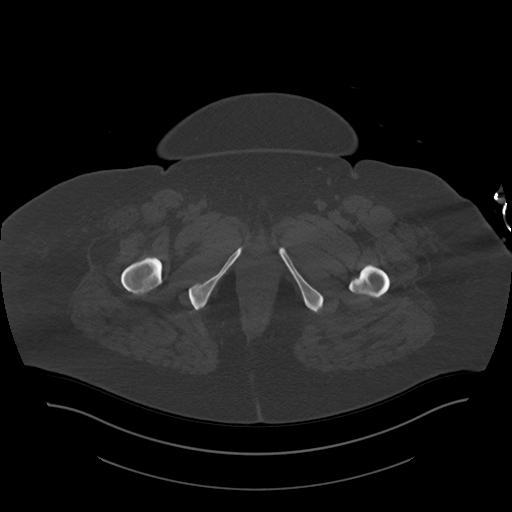
[im 38/300  soft-tissue]
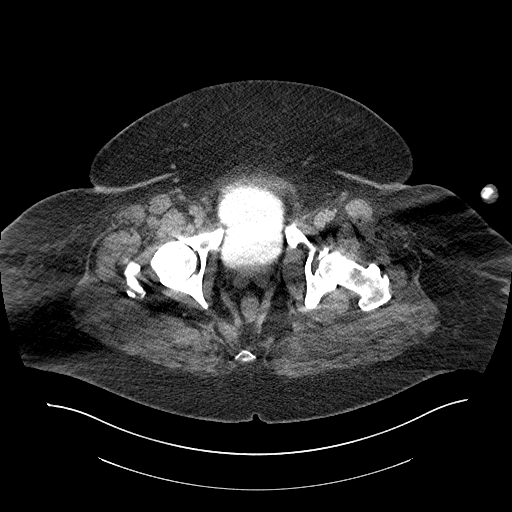
[im 57/300  soft-tissue]
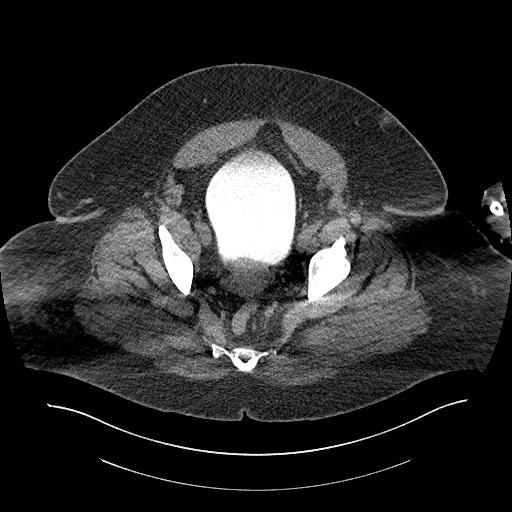
[im 94/300  soft-tissue]
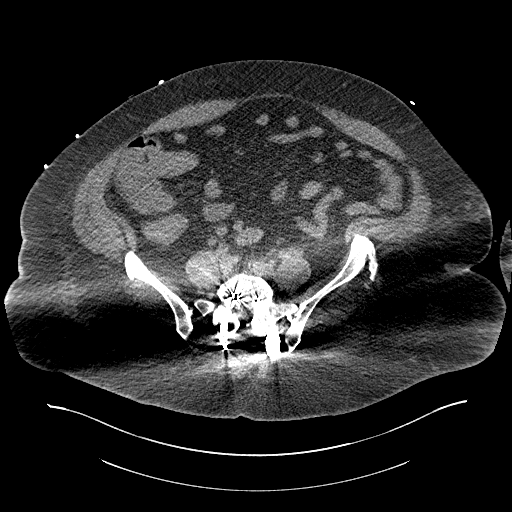
[im 113/300  soft-tissue]
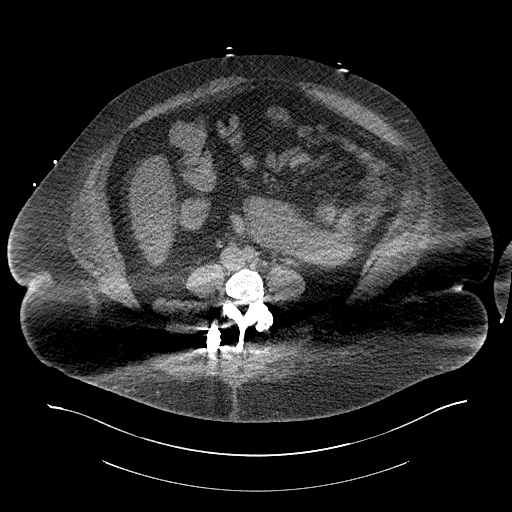
[im 131/300  soft-tissue]
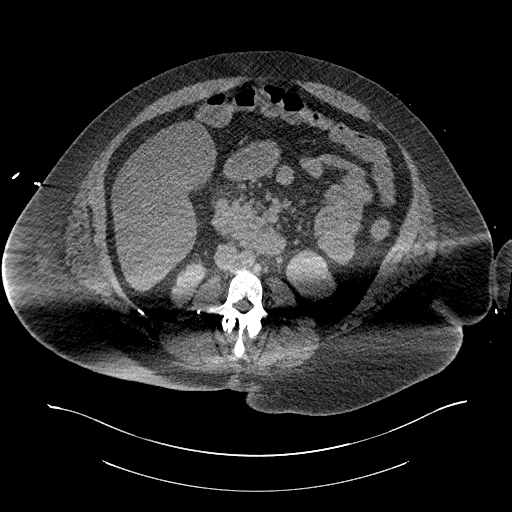
[im 150/300  soft-tissue]
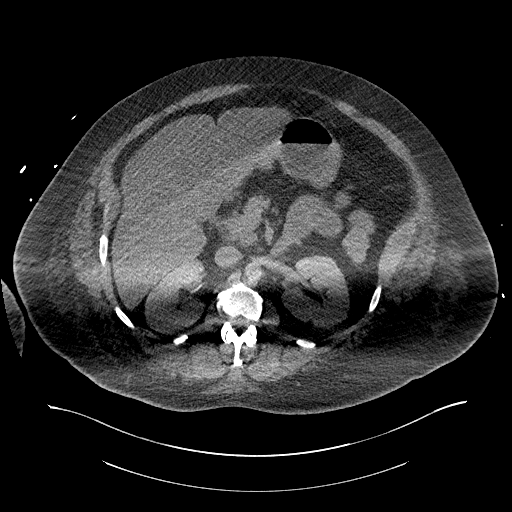
[im 169/300  soft-tissue]
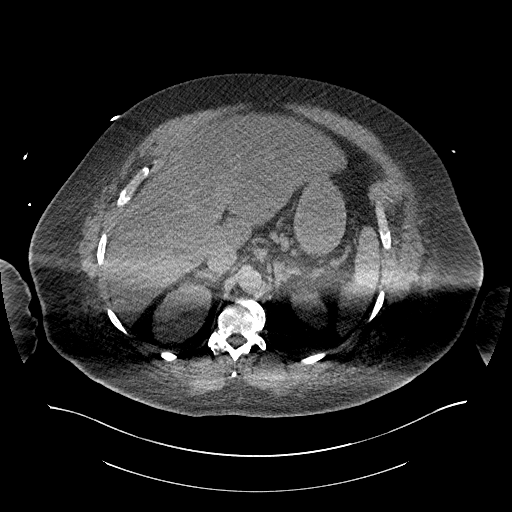
[im 187/300  soft-tissue]
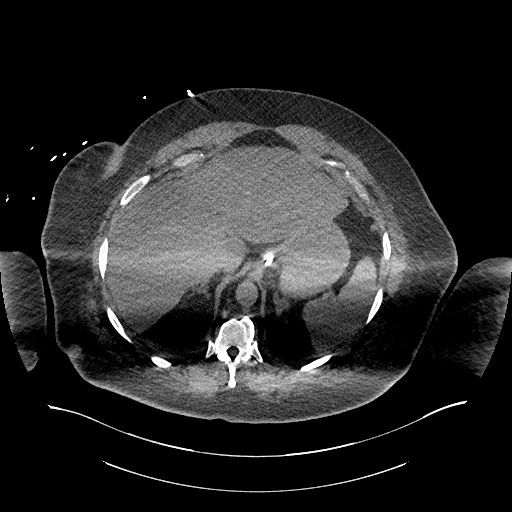
[im 187/300  bone]
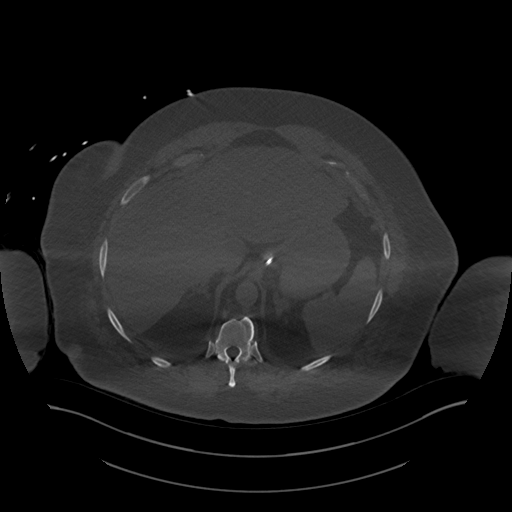
[im 206/300  soft-tissue]
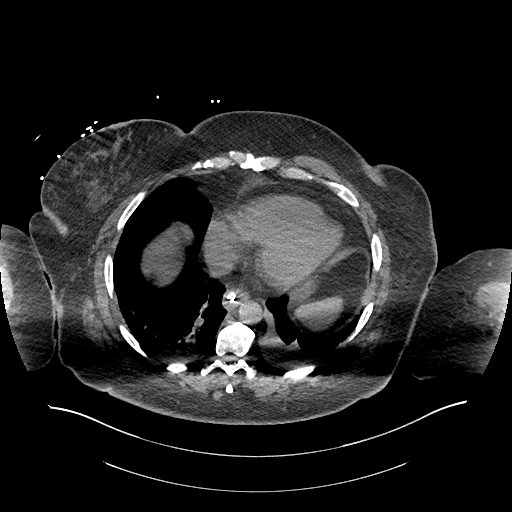
[im 243/300  soft-tissue]
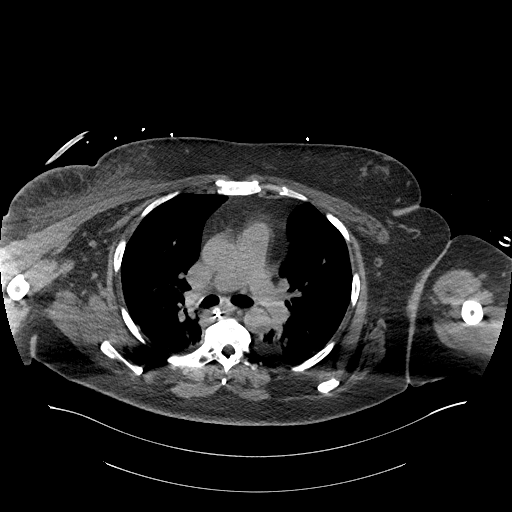
[im 262/300  soft-tissue]
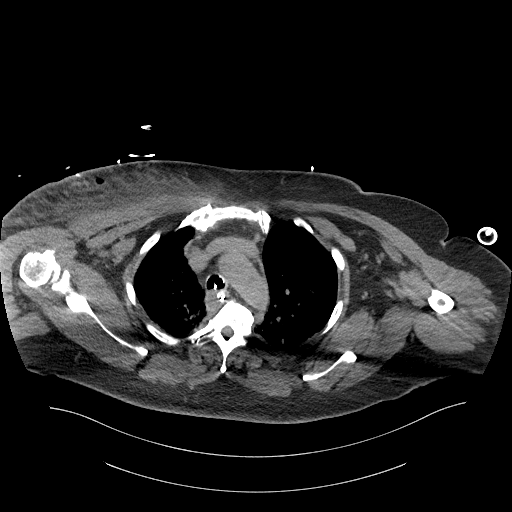
[im 281/300  soft-tissue]
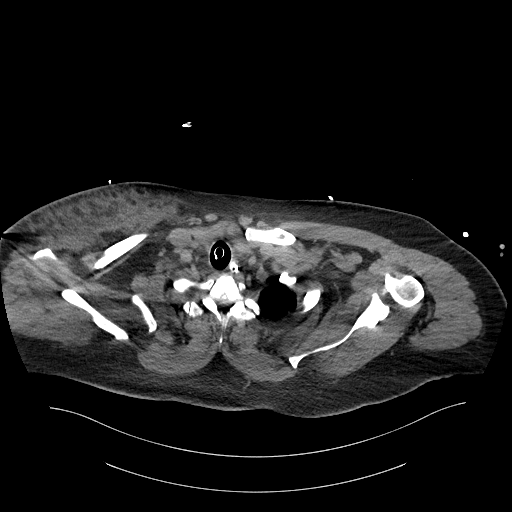

[16 of 46 positions shown; findings below may reference images not displayed]

FINDINGS: CT CHEST FINDINGS

Cardiovascular: The heart is normal in size. No pericardial
effusion.

No evidence of thoracic aortic aneurysm.

Mediastinum/Nodes: No suspicious mediastinal lymphadenopathy.

Visualized thyroid is unremarkable.

Lungs/Pleura: Endotracheal tube terminates 2.2 cm above the carina.

Patchy dependent opacities in the bilateral lower lobes, favoring
atelectasis. Additional mild dependent atelectasis in the bilateral
upper lobes.

Evaluation of the lung parenchyma is constrained by respiratory
motion. Within that constraint, there are no suspicious pulmonary
nodules.

No focal consolidation.

No pleural effusion or pneumothorax.

Musculoskeletal: Degenerative changes of the thoracic spine.

CT ABDOMEN PELVIS FINDINGS

Hepatobiliary: Unenhanced liver is unremarkable.

Status post cholecystectomy. No intrahepatic or extrahepatic ductal
dilatation.

Pancreas: Within normal limits.

Spleen: Within normal limits.

Adrenals/Urinary Tract: Adrenal glands are within normal limits.

Excretory contrast in the bilateral renal collecting systems related
to recent CTA. Kidneys otherwise within normal limits. No
hydronephrosis.

Excretory contrast in the bladder.

Stomach/Bowel: Enteric tube terminates in the gastric cardia, just
distal to the GE junction.

No evidence of bowel obstruction.

Normal appendix (series 3/image 92).

No colonic wall thickening or inflammatory changes.

Vascular/Lymphatic: No evidence of abdominal aortic aneurysm.

No suspicious abdominopelvic lymphadenopathy.

Reproductive: Uterus is within normal limits.

Bilateral ovaries are within normal limits.

Other: No abdominopelvic ascites.

Musculoskeletal: Status post PLIF at L5-S1. Degenerative changes of
the lumbar spine.
IMPRESSION: Endotracheal tube terminates 2.2 cm above the carina. Enteric tube
terminates in the gastric cardia, just below the GE junction.

Patchy/dependent opacities in the posterior upper and lower lobes,
favoring atelectasis.

No acute findings in the abdomen/pelvis.

## 2023-05-03 IMAGING — DX DG CHEST 1V PORT
1 series · 1 of 1 positions shown · non-contrast
Comparison: CT Chest, Abdomen, and Pelvis today 1007 hours.
Portable chest 03/28/2021.

CLINICAL DATA: 50-year-old female central line placement.

EXAM:
PORTABLE CHEST 1 VIEW

[chest ap]
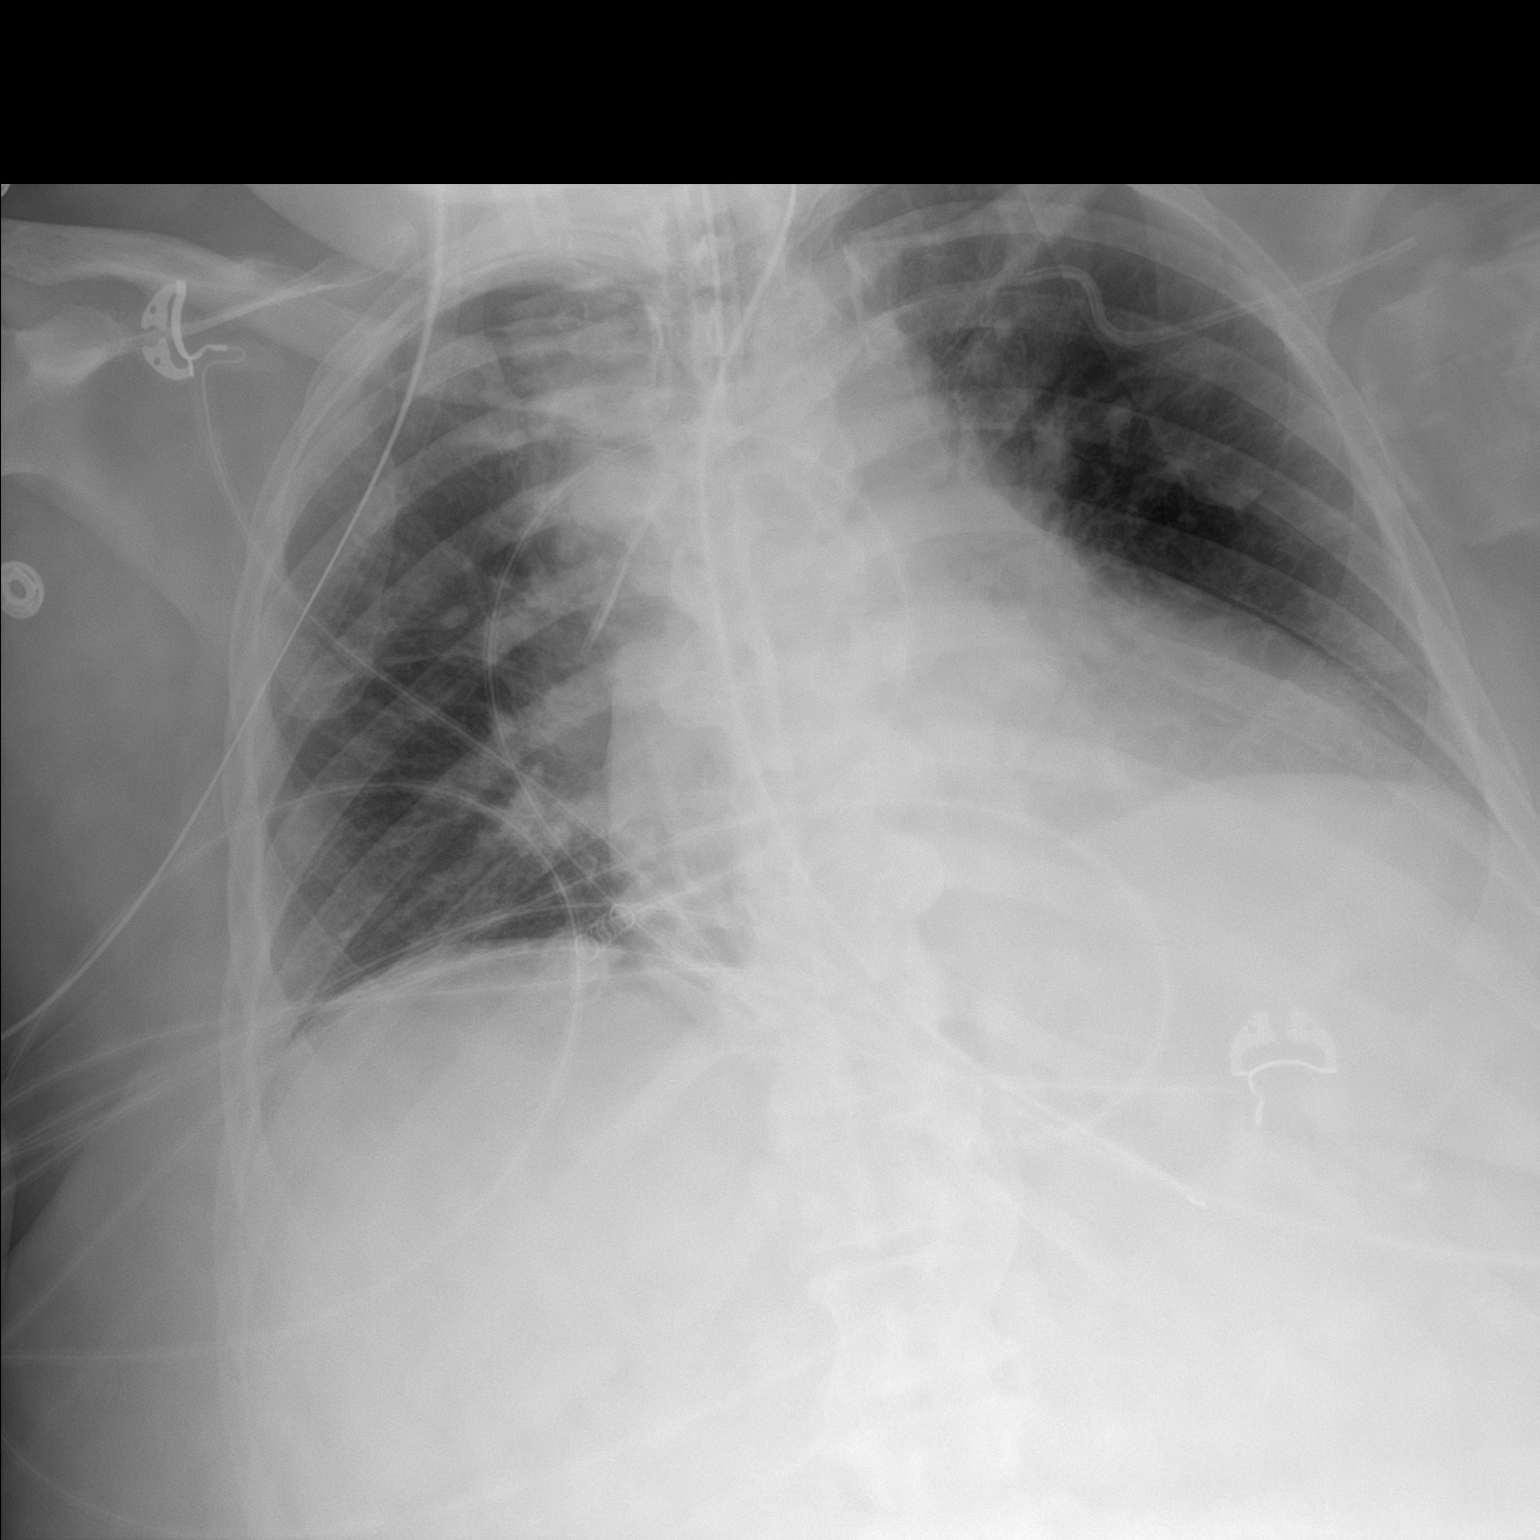

[1 of 1 positions shown; findings below may reference images not displayed]

FINDINGS: Portable AP semi upright view at 8517 hours. Left subclavian
approach central line in place, tip is below the carina at the level
of the lower SVC. Otherwise stable lines and tubes. Note that the
enteric tube side hole is at the level of the gastroesophageal
junction.

No pneumothorax. Lower lung volumes since yesterday. Stable cardiac
size and mediastinal contours. Confluent lower lobe opacity better
demonstrated by CT this morning. Paucity of bowel gas in the upper
abdomen.
IMPRESSION: 1. Left subclavian central line placed, tip at the lower SVC level.
No pneumothorax.
2. Enteric tube side hole at the GEJ level. Advanced 6 cm to ensure
side hole placement within the stomach.

## 2023-05-06 ENCOUNTER — Ambulatory Visit: Payer: Self-pay | Admitting: General Surgery

## 2023-05-06 IMAGING — CT CT HEART MORP W/ CTA COR W/ SCORE W/ CA W/CM &/OR W/O CM
4 of 7 series · 8 of 20 positions shown, 9 images · IV contrast (APPLIED)
Comparison: Chest CT 03/29/2021
COMPARISON: Chest CT 03/29/2021

Addendum:
EXAM:
Cardiac/Coronary  CT
TECHNIQUE: The patient was scanned on a [employer] Force scanner.

[Series 6: best diast 71 % · axial · 0.43mm/px · z∈[+1298,+1339]mm · 2 of 311 slices shown]
[im 104/311  vessel]
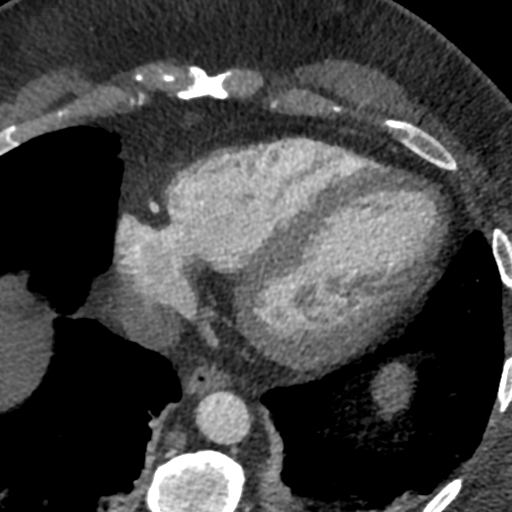
[im 207/311  vessel]
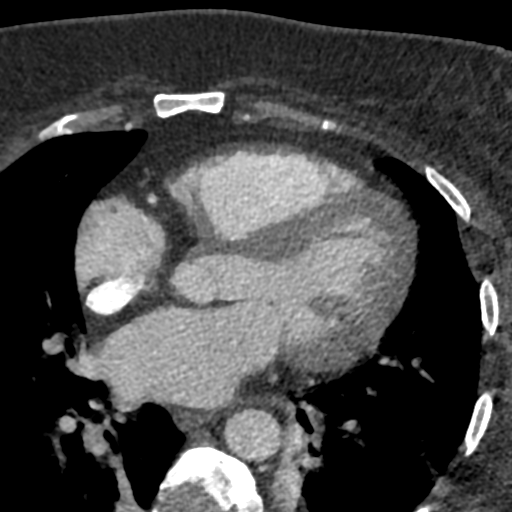

[Series 7: best syst · axial · 0.43mm/px · z∈[+1298,+1339]mm · 2 of 311 slices shown, 3 images]
[im 104/311  vessel]
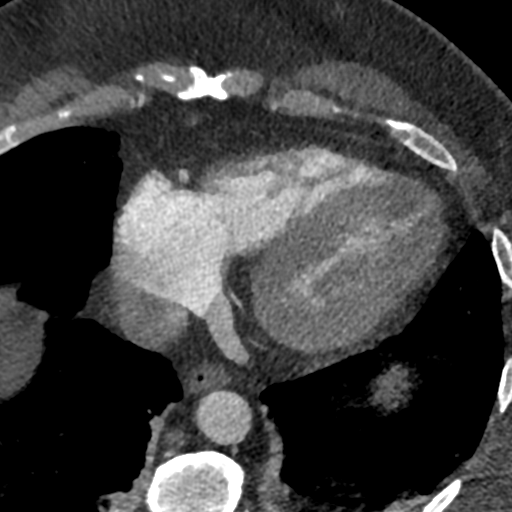
[im 104/311  lung]
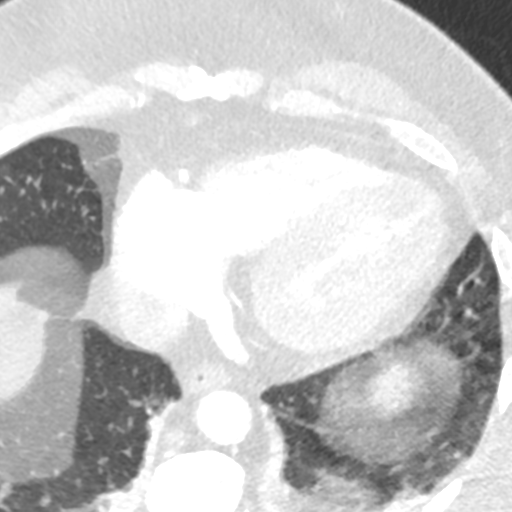
[im 207/311  vessel]
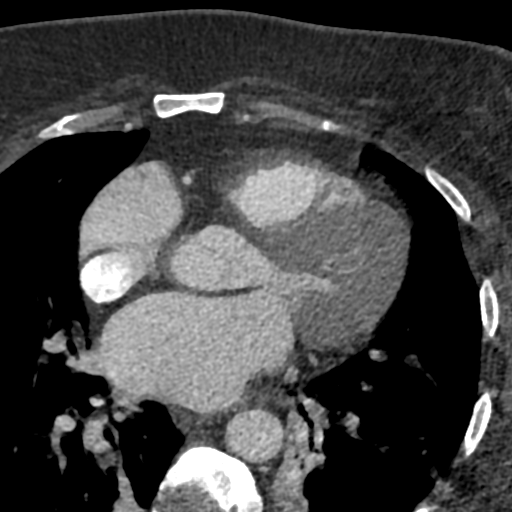

[Series 8: ts diast sharp 71 % · axial · 0.43mm/px · z∈[+1298,+1339]mm · 2 of 311 slices shown]
[im 104/311  lung]
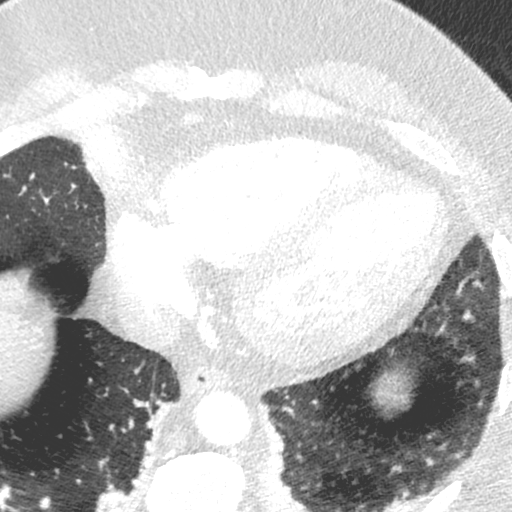
[im 207/311  lung]
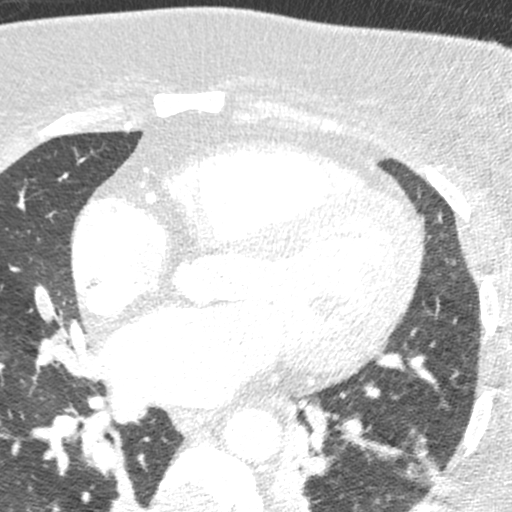

[Series 9: ts syst sharp · axial · 0.43mm/px · z∈[+1298,+1339]mm · 2 of 311 slices shown]
[im 104/311  lung]
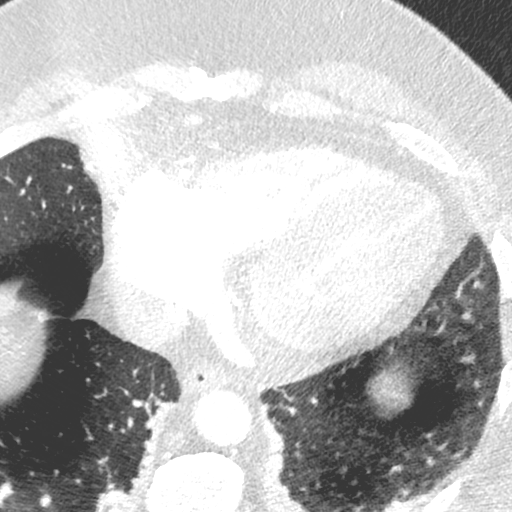
[im 207/311  lung]
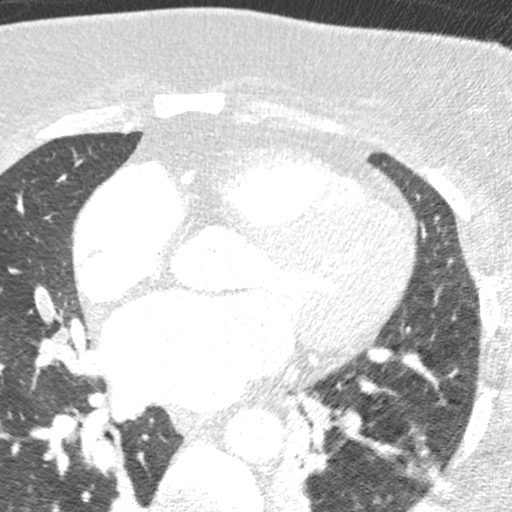

[8 of 20 positions shown; findings below may reference images not displayed]



Study Quality poor and limited by Noise and stitch artifact.

Aorta:  Normal size.  No calcifications.  No dissection.

Aortic Valve:  Trileaflet.  No calcifications.

Coronary Arteries:  Normal coronary origin.  Right dominance.

RCA is a large dominant artery that gives rise to PDA and PLVB.
There is no plaque.

Left main is a large artery that gives rise to LAD and LCX arteries.
There is no plaque.

LAD is a large vessel that gives rise to 2 diagonals. Cannot rule
out obstructive disease in the distal LAD which is not adequately
visualized. No obvious calcified plaque.

LCX is a non-dominant artery that gives rise to one large OM1
branch. Possible high grade non calcified plaque in the mid LCx with
associated stenosis of 70-99% but may be due to stitch artifact.

Other findings:

Normal pulmonary vein drainage into the left atrium.

Normal let atrial appendage without a thrombus.

Normal size of the pulmonary artery.
IMPRESSION: 1. Coronary calcium score of 0. This was 0 percentile for age and
sex matched control.

2.  Normal coronary origin with right dominance.

3. No evidence of calcified plaque. Poor quality study for non
calcified plaque with possible high grade soft plaque in the mid
LCx. The distal LAD is not visualized.

4. The study was not submitted for FFR analysis due to poor quality
and significant stitch artifact.

5. Recommend cardiac cath or nuclear stress testing if clinical
suspicion high.

Nirosha Bare

EXAM:
OVER-READ INTERPRETATION  CT CHEST

The following report is an over-read performed by radiologist Dr.
does not include interpretation of cardiac or coronary anatomy or
pathology. The coronary CTA interpretation by the cardiologist is
attached.
FINDINGS: Questionable filling defect involving a right lower lobe pulmonary
artery on sequence 11, image 19. This does not confidently represent
pulmonary emboli due to artifact in this area. There is another
questionable area in the right lower lobe on image 22 along the
medial aspect. Main pulmonary arteries appear to be patent.

Visualized mediastinal structures are unremarkable. Imaging of the
upper abdomen demonstrates an enlarged left hepatic lobe. Suspect
hepatic steatosis. Again noted is mild fullness of the left adrenal
gland that is incompletely evaluated.

Again noted is volume loss in the medial lower lobes. No significant
pleural fluid. Study has limitations from motion artifact. Bridging
osteophytes in the visualized thoracic spine.
IMPRESSION: 1. Potential filling defects involving pulmonary arteries in the
right lower lobe. However, this area has limited evaluation due to
motion artifact and poor opacification of the pulmonary arteries.
This finding is NOT definitive and recommend a dedicated Chest CTA
to evaluate for pulmonary embolism.
2. Persistent volume loss in the medial lower lobes.
3. Enlargement of left hepatic lobe with low-density. Findings are
concerning for hepatic steatosis and cirrhosis.

ADDENDUM:
These results were called by telephone at the time of interpretation
on 04/01/2021 at [DATE] to provider KWANGTAE CLARKE , who verbally
acknowledged these results.

*** End of Addendum ***
Addendum:
EXAM:
Cardiac/Coronary  CT
FINDINGS: A 120 kV prospective scan was triggered in the descending thoracic
aorta at 111 HU's. Axial non-contrast 3 mm slices were carried out
through the heart. The data set was analyzed on a dedicated work
station and scored using the Agatson method. Gantry rotation speed
was 250 msecs and collimation was .6 mm. No beta blockade and 0.8 mg
of sl NTG was given. The 3D data set was reconstructed in 5%
intervals of the 67-82 % of the R-R cycle. Diastolic phases were
analyzed on a dedicated work station using MPR, MIP and VRT modes.
The patient received 80 cc of contrast.

Study Quality poor and limited by Noise and stitch artifact.

Aorta:  Normal size.  No calcifications.  No dissection.

Aortic Valve:  Trileaflet.  No calcifications.

Coronary Arteries:  Normal coronary origin.  Right dominance.

RCA is a large dominant artery that gives rise to PDA and PLVB.
There is no plaque.

Left main is a large artery that gives rise to LAD and LCX arteries.
There is no plaque.

LAD is a large vessel that gives rise to 2 diagonals. Cannot rule
out obstructive disease in the distal LAD which is not adequately
visualized. No obvious calcified plaque.

LCX is a non-dominant artery that gives rise to one large OM1
branch. Possible high grade non calcified plaque in the mid LCx with
associated stenosis of 70-99% but may be due to stitch artifact.

Other findings:

Normal pulmonary vein drainage into the left atrium.

Normal let atrial appendage without a thrombus.

Normal size of the pulmonary artery.
IMPRESSION: 1. Coronary calcium score of 0. This was 0 percentile for age and
sex matched control.

2.  Normal coronary origin with right dominance.

3. No evidence of calcified plaque. Poor quality study for non
calcified plaque with possible high grade soft plaque in the mid
LCx. The distal LAD is not visualized.

4. The study was not submitted for FFR analysis due to poor quality
and significant stitch artifact.

5. Recommend cardiac cath or nuclear stress testing if clinical
suspicion high.

Nirosha Bare

EXAM:
OVER-READ INTERPRETATION  CT CHEST

The following report is an over-read performed by radiologist Dr.
does not include interpretation of cardiac or coronary anatomy or
pathology. The coronary CTA interpretation by the cardiologist is
attached.
FINDINGS: Questionable filling defect involving a right lower lobe pulmonary
artery on sequence 11, image 19. This does not confidently represent
pulmonary emboli due to artifact in this area. There is another
questionable area in the right lower lobe on image 22 along the
medial aspect. Main pulmonary arteries appear to be patent.

Visualized mediastinal structures are unremarkable. Imaging of the
upper abdomen demonstrates an enlarged left hepatic lobe. Suspect
hepatic steatosis. Again noted is mild fullness of the left adrenal
gland that is incompletely evaluated.

Again noted is volume loss in the medial lower lobes. No significant
pleural fluid. Study has limitations from motion artifact. Bridging
osteophytes in the visualized thoracic spine.
IMPRESSION: 1. Potential filling defects involving pulmonary arteries in the
right lower lobe. However, this area has limited evaluation due to
motion artifact and poor opacification of the pulmonary arteries.
This finding is NOT definitive and recommend a dedicated Chest CTA
to evaluate for pulmonary embolism.
2. Persistent volume loss in the medial lower lobes.
3. Enlargement of left hepatic lobe with low-density. Findings are
concerning for hepatic steatosis and cirrhosis.

*** End of Addendum ***
EXAM:
Cardiac/Coronary  CT
FINDINGS: A 120 kV prospective scan was triggered in the descending thoracic
aorta at 111 HU's. Axial non-contrast 3 mm slices were carried out
through the heart. The data set was analyzed on a dedicated work
station and scored using the Agatson method. Gantry rotation speed
was 250 msecs and collimation was .6 mm. No beta blockade and 0.8 mg
of sl NTG was given. The 3D data set was reconstructed in 5%
intervals of the 67-82 % of the R-R cycle. Diastolic phases were
analyzed on a dedicated work station using MPR, MIP and VRT modes.
The patient received 80 cc of contrast.

Study Quality poor and limited by Noise and stitch artifact.

Aorta:  Normal size.  No calcifications.  No dissection.

Aortic Valve:  Trileaflet.  No calcifications.

Coronary Arteries:  Normal coronary origin.  Right dominance.

RCA is a large dominant artery that gives rise to PDA and PLVB.
There is no plaque.

Left main is a large artery that gives rise to LAD and LCX arteries.
There is no plaque.

LAD is a large vessel that gives rise to 2 diagonals. Cannot rule
out obstructive disease in the distal LAD which is not adequately
visualized. No obvious calcified plaque.

LCX is a non-dominant artery that gives rise to one large OM1
branch. Possible high grade non calcified plaque in the mid LCx with
associated stenosis of 70-99% but may be due to stitch artifact.

Other findings:

Normal pulmonary vein drainage into the left atrium.

Normal let atrial appendage without a thrombus.

Normal size of the pulmonary artery.
IMPRESSION: 1. Coronary calcium score of 0. This was 0 percentile for age and
sex matched control.

2.  Normal coronary origin with right dominance.

3. No evidence of calcified plaque. Poor quality study for non
calcified plaque with possible high grade soft plaque in the mid
LCx. The distal LAD is not visualized.

4. The study was not submitted for FFR analysis due to poor quality
and significant stitch artifact.

5. Recommend cardiac cath or nuclear stress testing if clinical
suspicion high.

Nirosha Bare

## 2023-05-07 IMAGING — CT CT ANGIO CHEST
2 of 6 series · 18 of 36 positions shown · IV contrast (omnipaque)
Comparison: August 28, 2019.

CLINICAL DATA: High probability of pulmonary embolus.

EXAM:
CT ANGIOGRAPHY CHEST WITH CONTRAST
TECHNIQUE: Multidetector CT imaging of the chest was performed using the
standard protocol during bolus administration of intravenous
contrast. Multiplanar CT image reconstructions and MIPs were
obtained to evaluate the vascular anatomy.
CONTRAST:  65mL OMNIPAQUE IOHEXOL 350 MG/ML SOLN

[Series 7: pe thins · axial · 0.92mm/px · z∈[-280,-59]mm · 17 of 352 slices shown]
[im 18/352  lung]
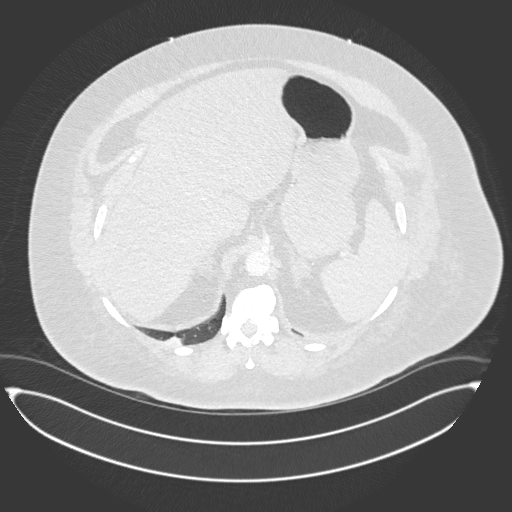
[im 36/352  mediastinal]
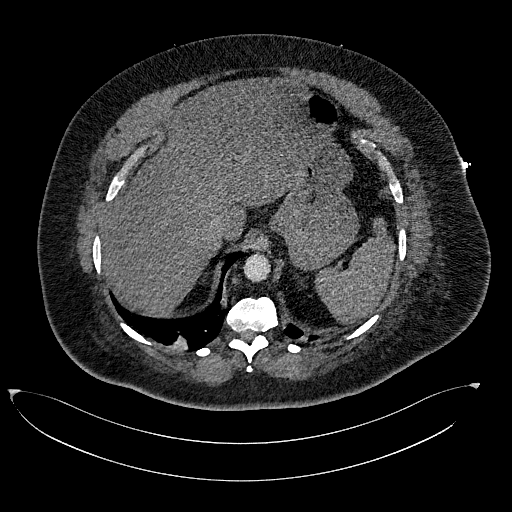
[im 53/352  lung]
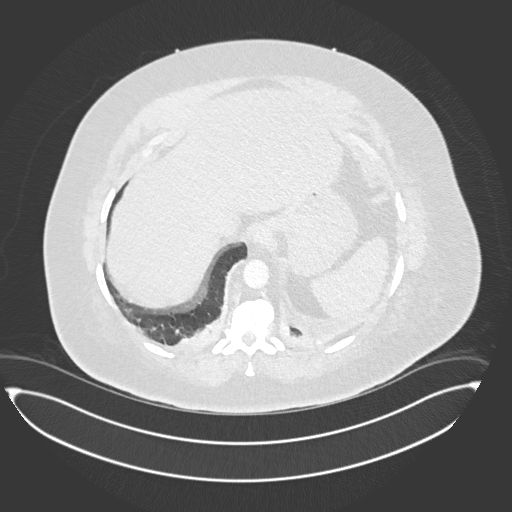
[im 71/352  mediastinal]
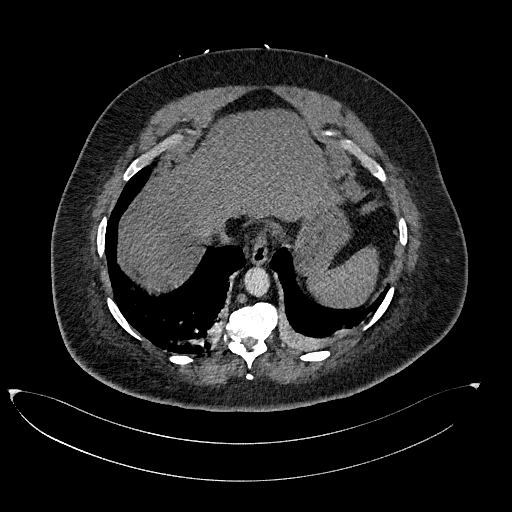
[im 106/352  lung]
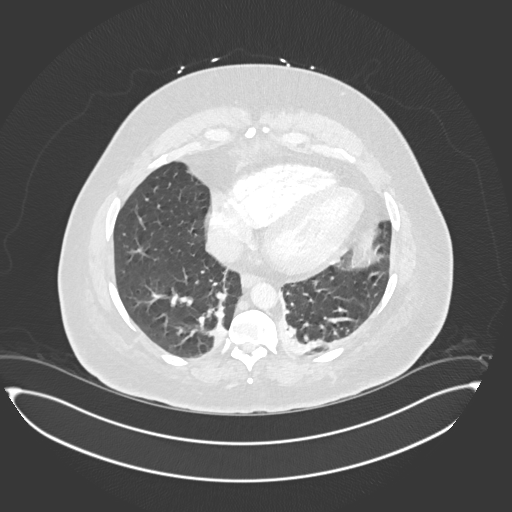
[im 123/352  mediastinal]
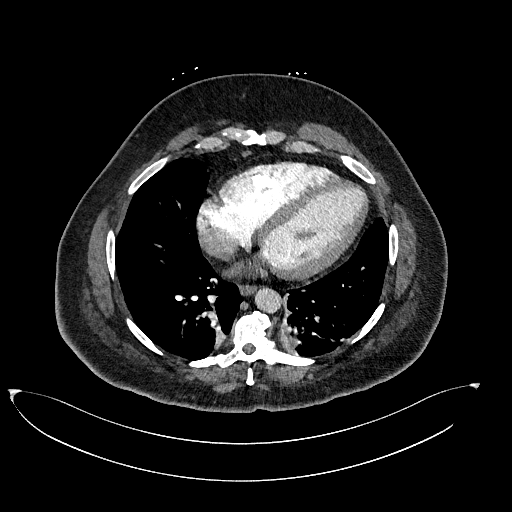
[im 141/352  lung]
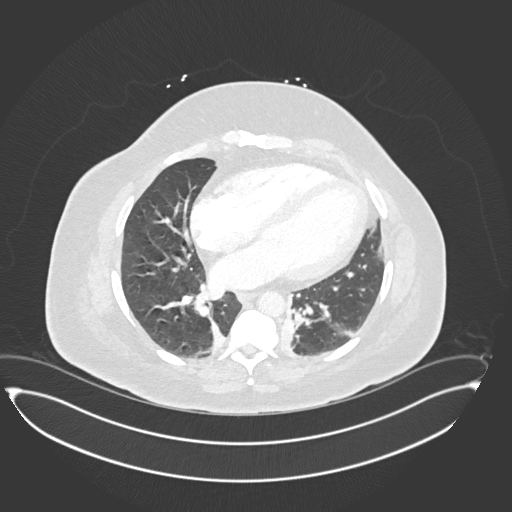
[im 158/352  mediastinal]
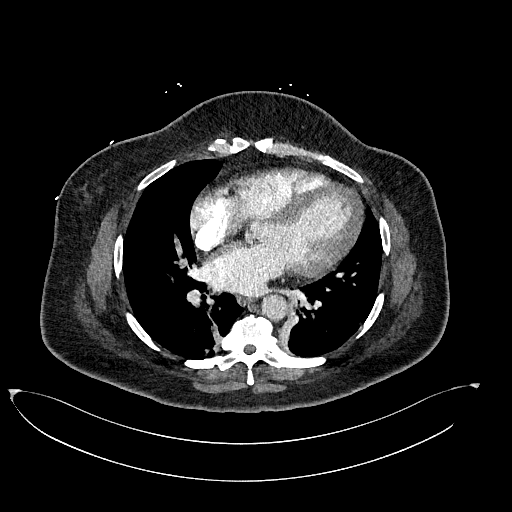
[im 176/352  lung]
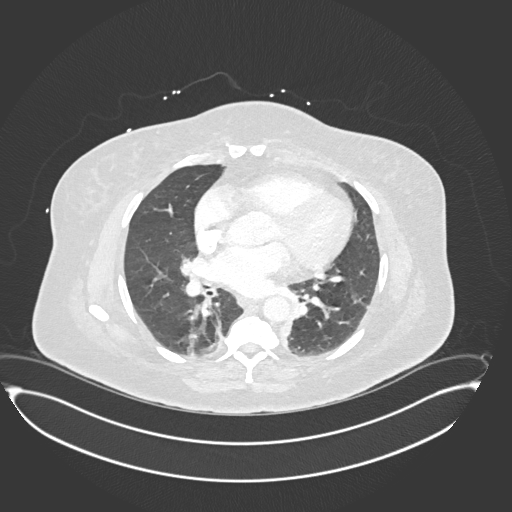
[im 194/352  mediastinal]
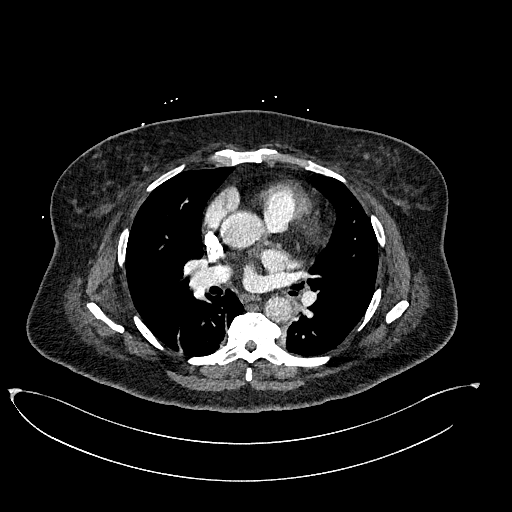
[im 211/352  lung]
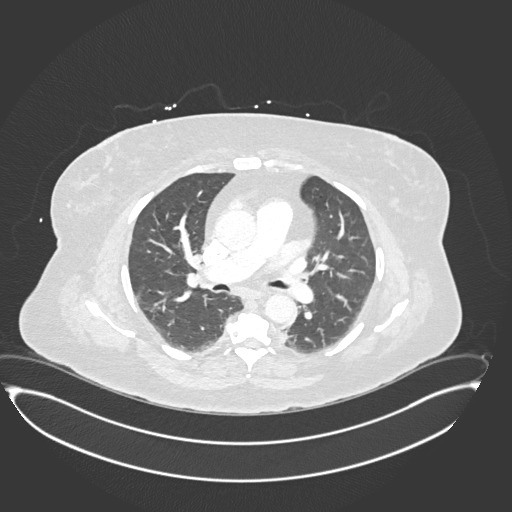
[im 229/352  mediastinal]
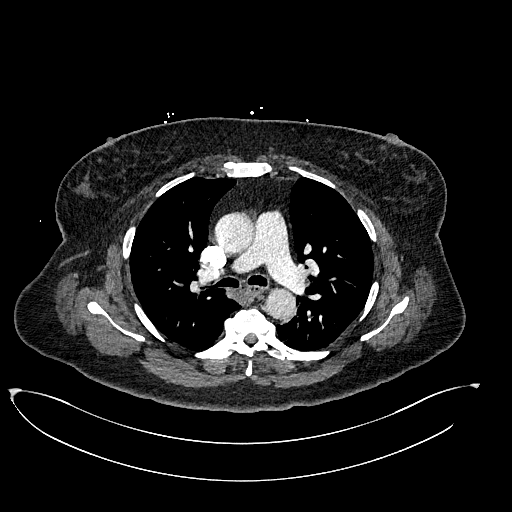
[im 246/352  lung]
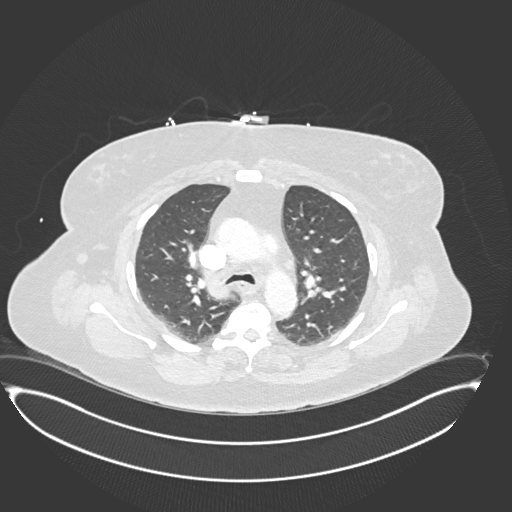
[im 281/352  mediastinal]
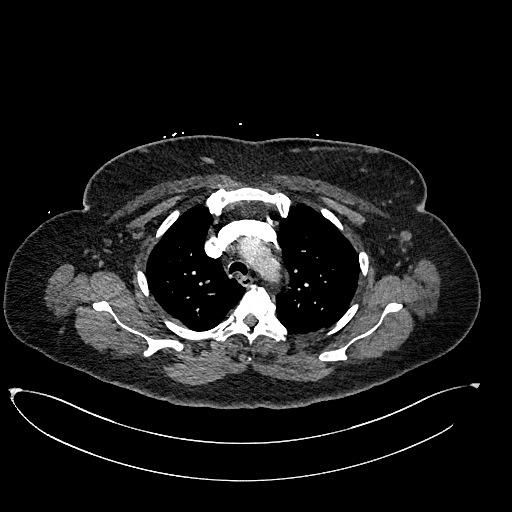
[im 299/352  lung]
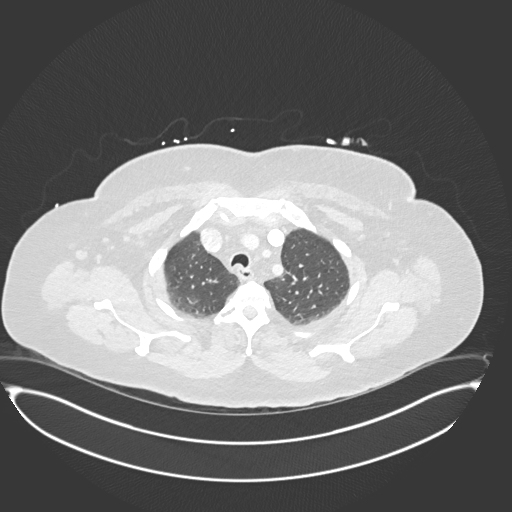
[im 316/352  mediastinal]
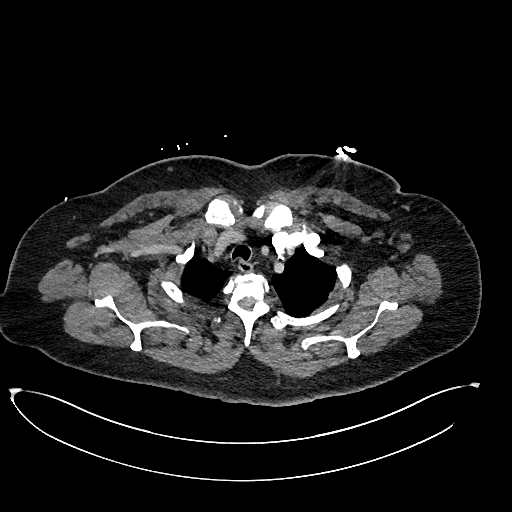
[im 334/352  lung]
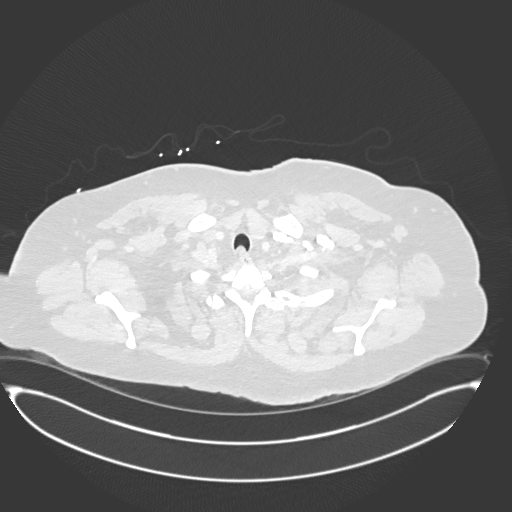

[Series 10: pe 2mm cor · coronal · 0.50mm/px · 1 of 151 slices shown]
[im 76/151  mediastinal]
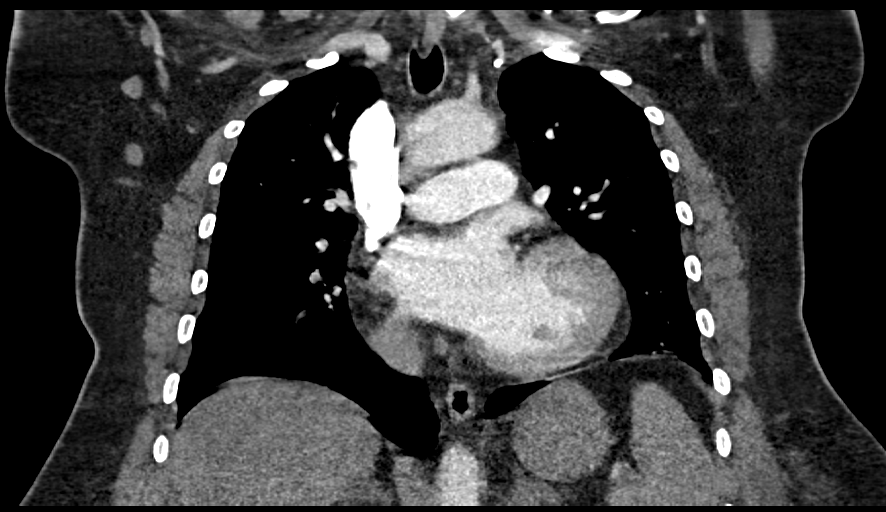

[18 of 36 positions shown; findings below may reference images not displayed]

FINDINGS: Cardiovascular: Satisfactory opacification of the pulmonary arteries
to the segmental level. No evidence of pulmonary embolism. Mild
cardiomegaly is noted. No pericardial effusion.

Mediastinum/Nodes: No enlarged mediastinal, hilar, or axillary lymph
nodes. Thyroid gland, trachea, and esophagus demonstrate no
significant findings.

Lungs/Pleura: No pneumothorax or pleural effusion is noted. Mild
bilateral posterior basilar subsegmental atelectasis is noted.

Upper Abdomen: Hepatic steatosis.

Musculoskeletal: No chest wall abnormality. No acute or significant
osseous findings.

Review of the MIP images confirms the above findings.
IMPRESSION: No definite evidence of pulmonary embolus.

Mild bilateral posterior basilar subsegmental atelectasis.

Hepatic steatosis.

## 2023-05-11 ENCOUNTER — Other Ambulatory Visit (HOSPITAL_COMMUNITY): Payer: Self-pay

## 2023-05-11 MED ORDER — OXYCODONE HCL 10 MG PO TABS
10.0000 mg | ORAL_TABLET | Freq: Every day | ORAL | 0 refills | Status: DC
Start: 2023-05-11 — End: 2023-06-08
  Filled 2023-05-11: qty 150, 30d supply, fill #0

## 2023-05-12 ENCOUNTER — Encounter (HOSPITAL_COMMUNITY): Payer: Self-pay | Admitting: Anesthesiology

## 2023-05-12 ENCOUNTER — Telehealth (HOSPITAL_BASED_OUTPATIENT_CLINIC_OR_DEPARTMENT_OTHER): Payer: Self-pay

## 2023-05-12 ENCOUNTER — Encounter (HOSPITAL_BASED_OUTPATIENT_CLINIC_OR_DEPARTMENT_OTHER): Payer: Self-pay | Admitting: General Surgery

## 2023-05-12 ENCOUNTER — Ambulatory Visit (HOSPITAL_BASED_OUTPATIENT_CLINIC_OR_DEPARTMENT_OTHER): Payer: Medicaid Other | Admitting: Cardiovascular Disease

## 2023-05-12 NOTE — Telephone Encounter (Signed)
Left message for surgery scheduler Michel Bickers, LPN to call back. Pre op APP is wanting to confirm if the pt is truly needing an IN OFFICE appt as stated on the clearance, see notes.

## 2023-05-12 NOTE — Telephone Encounter (Signed)
   Pre-operative Risk Assessment    Patient Name: Lori Bean  DOB: [birth year] MRN: [id number]      Request for Surgical Clearance    Procedure:   Revision and Reimplantation Sacral Nerve Stimulator Implantation Device  Date of Surgery:  Clearance 05/20/23                                 Surgeon:  Lori Levee, MD Surgeon's Group or Practice Name:  [facility] Phone number:  [phone] Fax number:  [phone] Lori Bickers, LPN)   Type of Clearance Requested:   - Medical    Type of Anesthesia:  General    Additional requests/questions:   She needs to be scheduled for an in office visit prior to surgery per anesthesiology.  Lori Bean   05/12/2023, 11:57 AM

## 2023-05-12 NOTE — Progress Notes (Addendum)
Reviewing pt chart to do pre-op interview for surgery on 05-20-2023 by Dr A. Thomas @[facility] .  Noted pt has had surgery previously @[facility]  09-25-2022 by Dr A. Thomas for sacral nerve stimulator implant.  This time her surgery is for explant and re-implant stimulator.  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  Noted pt had ED visit 04-28-2023 with chest pain, unspecified, SOB, coughing and per ED doctor felt viral after testing no follow given.  Noted pt last cardiology visit 10-02-2022 told to follow up in 6 weeks due to CHF , pt did not.  And pt last COPD exacerbation with ED visit no admission , but did have pulmonology visit w/ Dr Debara Pickett 12-14-2022 treated with medication and told to follow up in 4 months which would approx 04-15-2023 pt has not.  There are not any appointment's in epic.   Chart reviewed w/ anesthesia, Dr Renold Don MDA. Dr Renold Don MDA stated pt will need to have in office follow up visit with her cardiologist, Dr Lavell Islam, and pulmonologist, Dr Vassie Loll,  prior to having her surgery due to recent ED visit . Called and spoke w/ triage nurse, Chemira, informed her of anesthesia recommendation for pt prior to her having surgery.  Told her who pt's cardiologist and pulmonologist is.

## 2023-05-12 NOTE — Telephone Encounter (Signed)
   Name: Lori Bean  DOB: 07-10-1971  MRN: [id number]  Primary Cardiologist: Chilton Si, MD  Chart reviewed as part of pre-operative protocol coverage. Because of Lori Bean's past medical history and time since last visit, she will require a follow-up in-office visit in order to better assess preoperative cardiovascular risk.  Pre-op covering staff: - Please schedule appointment and call patient to inform them. If patient already had an upcoming appointment within acceptable timeframe, please add "pre-op clearance" to the appointment notes so provider is aware. - Please contact requesting surgeon's office via preferred method (i.e, phone, fax) to inform them of need for appointment prior to surgery.  Carlos Levering, NP  05/12/2023, 3:03 PM

## 2023-05-12 NOTE — Telephone Encounter (Signed)
Pt called back and our scheduling worked hard to get the pt in for an appt for pre op clearance. See previous notes. We offered and appt today with Dr. Quay Burow (pt's cardiologist) at the [facility] location today at 4 pm, ok per Dr. Duke Salvia. We had left message earlier that maybe we could see her tomorrow at the [facility] location if she could not be seen today, however that vm left for the pt also stated not sure if will still be available when she does call back. The appt for tomorrow was taken by the time the pt called back. Pt first said she could not come as she did not have transportation. Then she said her sister can bring her today for the 4 pm appt with Dr. Duke Salvia at [facility]. Pt then said no because it was too far from [facility] location. Pt was the  agreeable to an appt 05/18/23, but then said no.  I instructed the scheduler to relay that her surgery will probably need to be rescheduled. Scheduler states the pt said whatever and hung up on her.   I will update the surgeon's office of call today. Not sure that we will get the pt in before her surgery. Pt knows she needs appt and will need to call to schedule.

## 2023-05-12 NOTE — Telephone Encounter (Signed)
Pre-op team,   Will you please clarify the request regarding needing an in office visit? Per our protocol, patient can be evaluated through a tele visit.   Thank you!  Etta Grandchild. Charlese Gruetzmacher, DNP, NP-C  05/12/2023, 12:10 PM [facility] [address] Office [phone] Fax [phone]

## 2023-05-12 NOTE — Telephone Encounter (Signed)
I left a message for the pt to call back as she is going to need an appt in office per anesthesiologist. See previous notes. Pt can be seen either at [facility], [facility]  or [facility].

## 2023-05-12 NOTE — Telephone Encounter (Signed)
cardiac clearance Received: Today Lori Bean, CMA Good afternoon Okey Regal,  Per anesthesia pt needs an in office visit. Can see note from Harless Litten, RN from pre admit today in [place].

## 2023-05-18 ENCOUNTER — Ambulatory Visit: Payer: Medicaid Other | Admitting: Nurse Practitioner

## 2023-05-20 ENCOUNTER — Ambulatory Visit (HOSPITAL_BASED_OUTPATIENT_CLINIC_OR_DEPARTMENT_OTHER): Admission: RE | Admit: 2023-05-20 | Payer: Medicaid Other | Source: Home / Self Care | Admitting: General Surgery

## 2023-05-20 SURGERY — INSERTION, NEUROSTIMULATOR, SACRAL
Anesthesia: Monitor Anesthesia Care

## 2023-05-28 IMAGING — DX DG CHEST 2V
2 series · 2 of 2 positions shown · non-contrast
Comparison: CT 04/02/2021.  Radiographs 03/29/2021 and 03/28/2021.

CLINICAL DATA: Community acquired pneumonia.

EXAM:
CHEST - 2 VIEW

[chest pa]
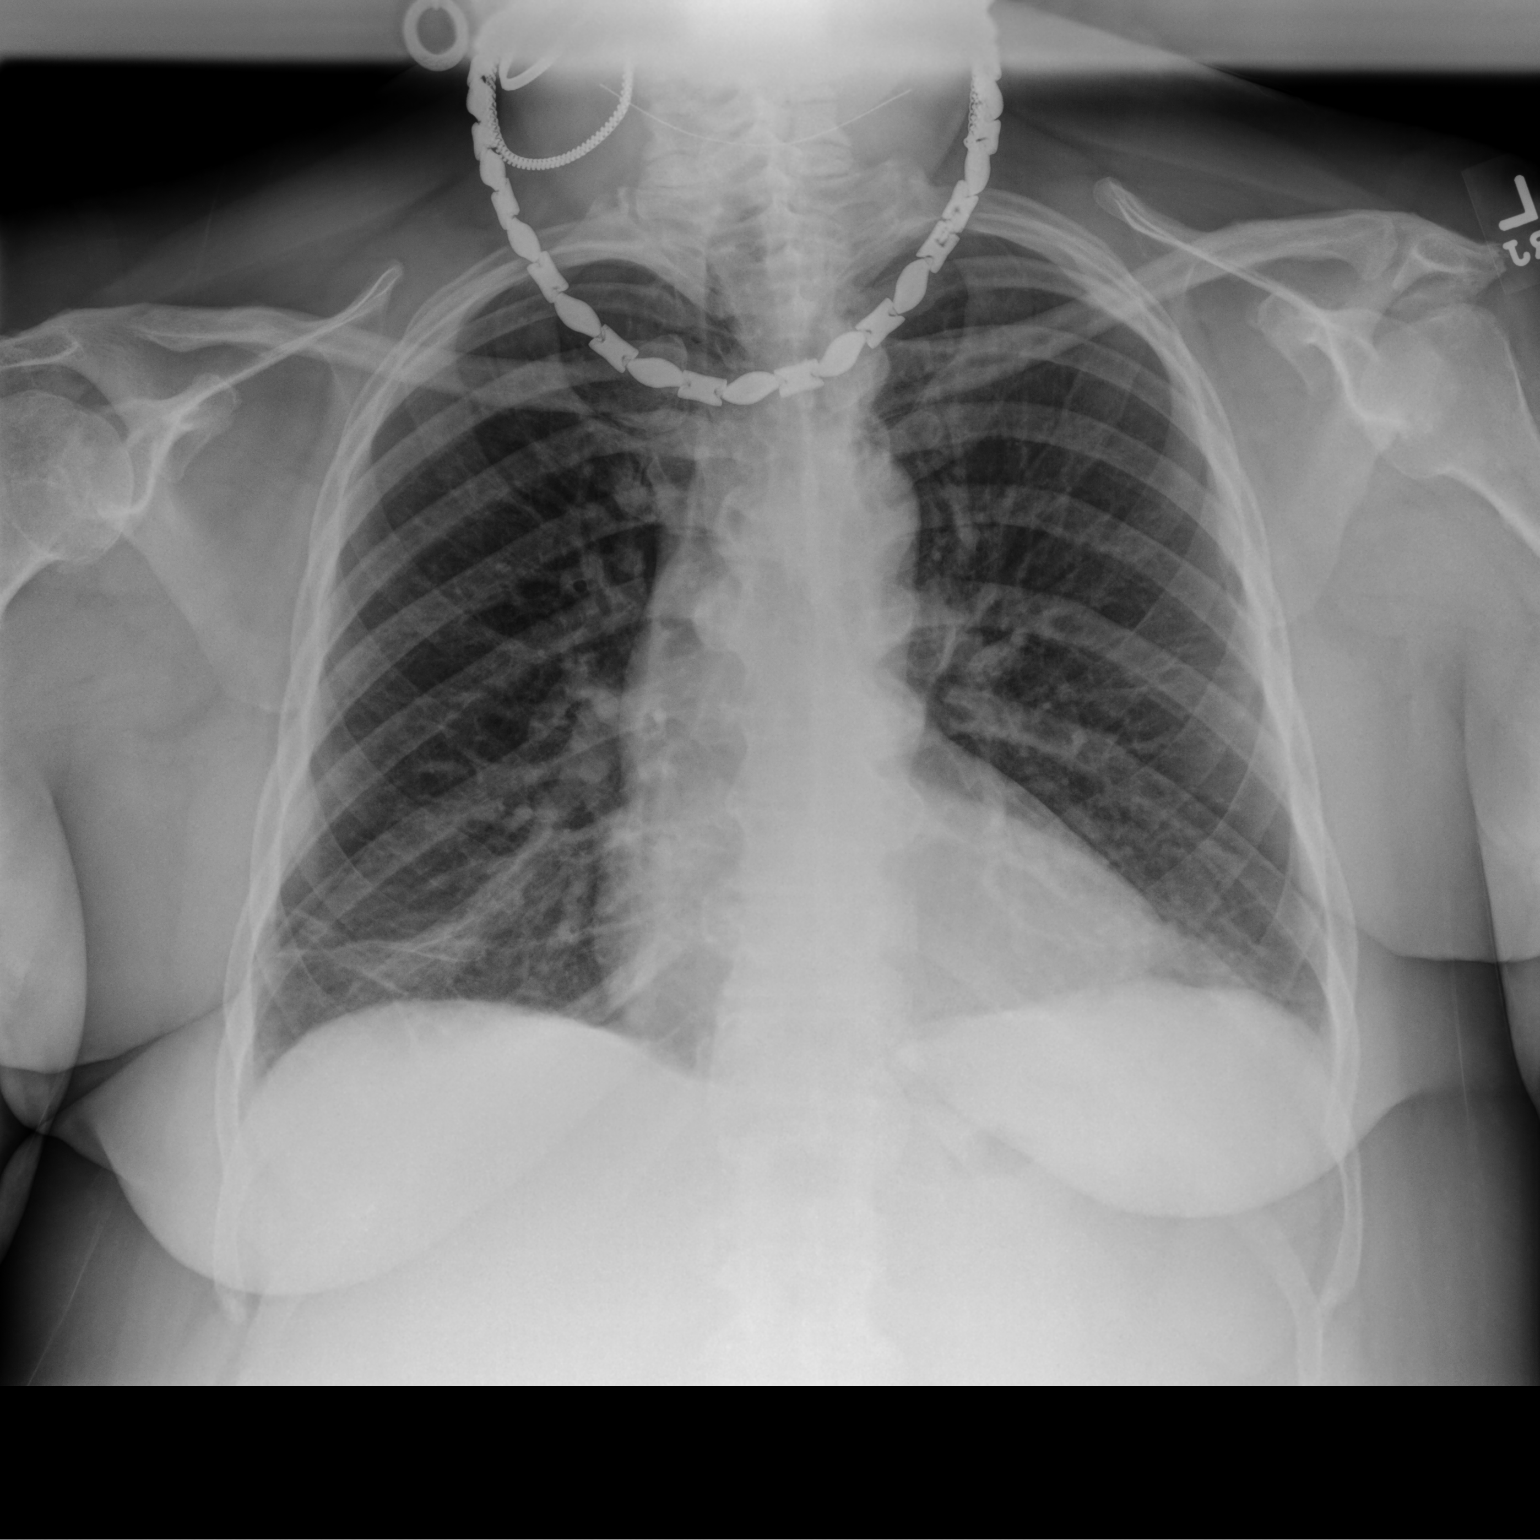

[chest lat]
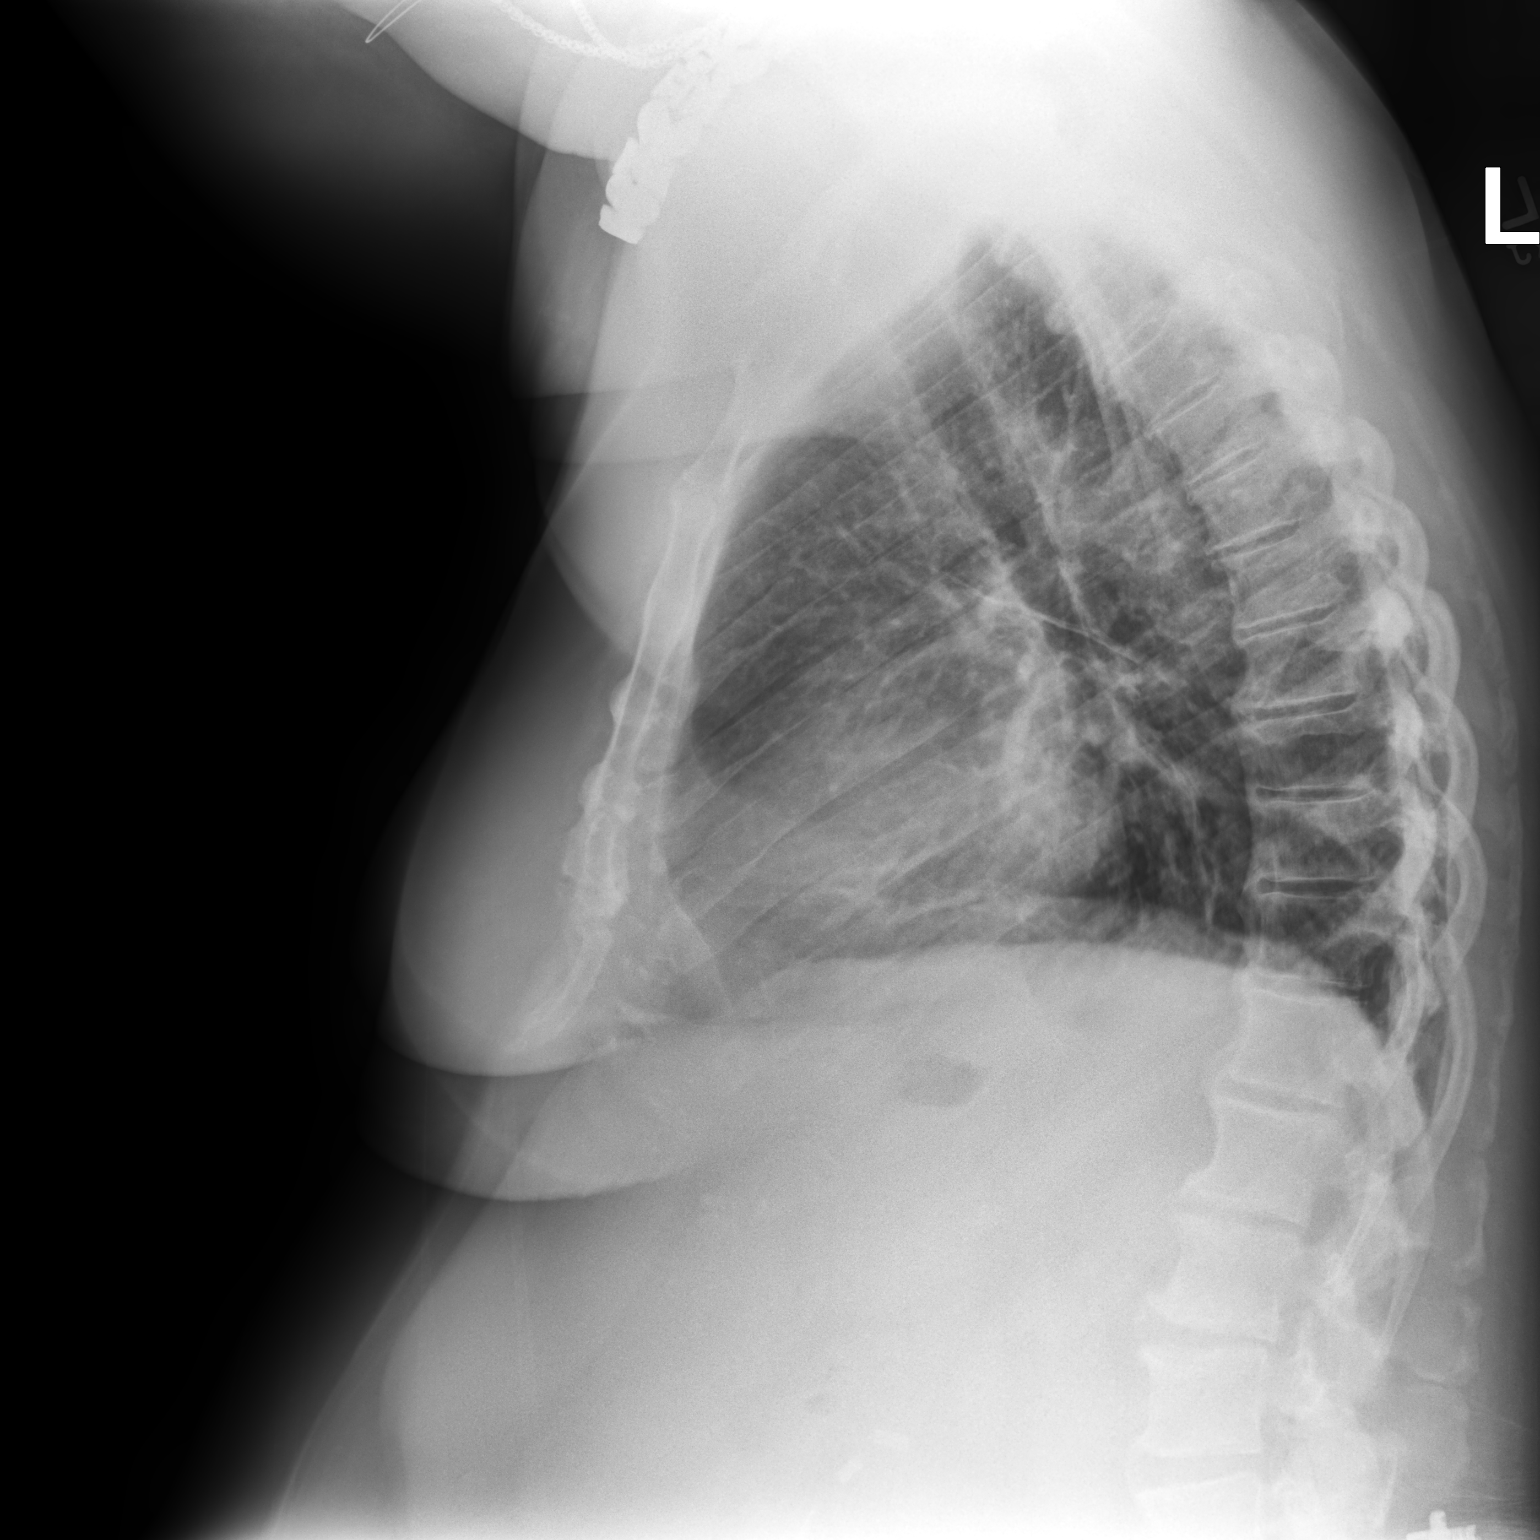

[2 of 2 positions shown; findings below may reference images not displayed]

FINDINGS: The heart size and mediastinal contours are stable. The overall
pulmonary aeration has improved compared with the prior radiographs.
There is mild linear atelectasis or scarring at both lung bases, but
no consolidation or significant pleural effusion. There is no
pneumothorax. Mild degenerative changes are present within the
thoracic spine.
IMPRESSION: Mild bibasilar linear scarring or atelectasis. No focal airspace
disease.

## 2023-06-02 ENCOUNTER — Other Ambulatory Visit (HOSPITAL_COMMUNITY): Payer: Self-pay

## 2023-06-02 MED ORDER — ARIPIPRAZOLE 20 MG PO TABS
20.0000 mg | ORAL_TABLET | Freq: Every day | ORAL | 2 refills | Status: AC
  Filled 2023-06-02: qty 30, 30d supply, fill #0

## 2023-06-04 ENCOUNTER — Other Ambulatory Visit (HOSPITAL_COMMUNITY): Payer: Self-pay

## 2023-06-08 ENCOUNTER — Other Ambulatory Visit (HOSPITAL_COMMUNITY): Payer: Self-pay

## 2023-06-08 MED ORDER — OXYCODONE HCL 10 MG PO TABS
10.0000 mg | ORAL_TABLET | Freq: Every day | ORAL | 0 refills | Status: DC
Start: 2023-06-08 — End: 2023-07-06
  Filled 2023-06-08: qty 180, 30d supply, fill #0

## 2023-06-08 MED ORDER — METHOCARBAMOL 500 MG PO TABS
500.0000 mg | ORAL_TABLET | Freq: Three times a day (TID) | ORAL | 1 refills | Status: DC | PRN
  Filled 2023-06-08: qty 90, 30d supply, fill #0

## 2023-06-14 ENCOUNTER — Telehealth: Payer: Self-pay | Admitting: Pulmonary Disease

## 2023-06-14 ENCOUNTER — Ambulatory Visit: Payer: Medicaid Other | Admitting: Physician Assistant

## 2023-06-14 NOTE — Telephone Encounter (Signed)
Pt calling in bc a paper was sent for Dr. Vassie Loll to sign and wants to know if it was rcvd

## 2023-06-16 ENCOUNTER — Other Ambulatory Visit (HOSPITAL_COMMUNITY): Payer: Self-pay

## 2023-06-20 NOTE — Progress Notes (Unsigned)
Cardiology Office Note:  .   Date:  06/22/2023  ID:  Lori Bean, DOB 01-Jun-1971, MRN [id number] PCP: Bettey Costa, NP  [facility] Providers Cardiologist:  Chilton Si, MD Cardiology APP:  Alver Sorrow, NP    Patient Profile: .      PMH Chronic HFpEF Asthma OSA on CPAP Obesity Prior PE Lymphedema Schizophrenia Bipolar disorder  Established with cardiology during admission 03/2021 with acute hyper cardiac respiratory failure requiring intubation.  She had elevated troponins.  Cardiac CT was less than ideal given body habitus though coronary calcium score of 0.  Troponin was deemed demand ischemia and no further ischemia evaluation warranted.  TTE 03/29/2021 revealed LVEF 60 to 65%, no RWMA, normal diastolic parameters, no significant valve disease.  Her home antihypertensive regimen was resumed.  Seen 06/05/2021 for hospital follow-up.  She was referred to prep exercise program and recommended to follow-up with cardiology in 3 to 4 months.  She was not seen until 06/26/2022 at which time she was doing well but was noted to be out of her triamterene/hydrochlorothiazide and atorvastatin.  These were resumed.  Admission 12/13-12/15/23 treated for COPD exacerbation.  She was provided IV Lasix for lower extremity edema.  She underwent sacral nerve stimulator implantation 09/25/2022.  Last cardiology clinic visit was 10/02/2022 with Lori Shields, NP.  She reported atypical right-sided chest pain that is tender on palpation for which she was taking ibuprofen with some relief.  Exertional dyspnea overall unchanged.  No orthopnea or PND.  LE edema felt to be worse over the previous few weeks.  She was wrapping her legs nightly.  PCP started her on Lasix 20 mg daily but she did not note much of a response.  She was having trouble getting her Symbicort due to issues with the Social Security office.  She was advised to increase Lasix to 40 mg twice daily for 2 days then 40 mg  daily.  BNP on that day was normal and renal function was stable. She was offered assistance by the social work team for help with getting medications and was advised to return in 6 weeks for follow-up.        History of Present Illness: .   Lori Bean is a very pleasant 52 y.o. female who is here today for preoperative cardiac evaluation for revision and reimplantation of sacral nerve stimulator implantation device. Discussed the use of AI scribe software for clinical note transcription with the patient, who gave verbal consent to proceed. She reports persistent fluid retention despite increasing Lasix to twice daily. She has variable urine output and feels bloated. She experiences shortness of breath with exertion and cannot perform activities such as dressing, walking two city blocks, or mopping two rooms without stopping. She uses four pillows for sleeping due to shortness of breath and a sensation of drowning without her CPAP/BiPAP mask. She occasionally experiences chest pain at different times of the day and night. She reports balance issues, preventing her from carrying heavy items or doing yard work. Despite these limitations, she is able to maintain sexual activity. Has not experienced significant weight loss on  Ozempic. She has a regular eating pattern, avoiding heavy meals after 8 PM due to nightmares. Her diet consists of cereal and fruit for breakfast, chicken tacos for lunch, and a meat with two vegetables for dinner. She mainly cooks her meats in the oven, air fryer, or crock pot to avoid fried foods due to acid reflux. She drinks more  water than anything else. Metoprolol was stopped by another provider.    ROS: See HPI       Studies Reviewed: [name]   EKG Interpretation Date/Time:  Tuesday June 22 2023 15:09:49 EDT Ventricular Rate:  94 PR Interval:  168 QRS Duration:  82 QT Interval:  360 QTC Calculation: 450 R Axis:   -2  Text Interpretation: Normal sinus rhythm  Nonspecific T wave abnormality When compared with ECG of 28-Apr-2023 20:18, PREVIOUS ECG IS PRESENT No acute changes Confirmed by Lori Bean [phone]) on 06/22/2023 3:17:16 PM     Risk Assessment/Calculations:     HYPERTENSION CONTROL Vitals:   06/22/23 1513 06/22/23 1635  BP: (!) 162/88 (!) 148/80    The patient's blood pressure is elevated above target today.  In order to address the patient's elevated BP: A new medication was prescribed today.          Physical Exam:   VS:  BP (!) 148/80   Pulse 94   Ht 5\' 2"  (1.575 m)   Wt 249 lb (112.9 kg)   LMP 10/15/2018   BMI 45.54 kg/m    Wt Readings from Last 3 Encounters:  06/22/23 249 lb (112.9 kg)  04/28/23 253 lb (114.8 kg)  12/14/22 253 lb 12.8 oz (115.1 kg)    GEN: Obese, well developed in no acute distress NECK: No JVD; No carotid bruits CARDIAC: RRR, no murmurs, rubs, gallops RESPIRATORY:  Clear to auscultation without rales, wheezing or rhonchi  ABDOMEN: Soft, non-tender, non-distended EXTREMITIES:  No edema; No deformity     ASSESSMENT AND PLAN: .    Preoperative cardiac evaluation: According to the Revised Cardiac Risk Index (RCRI), her Perioperative Risk of Major Cardiac Event is (%): 0.9. Her Functional Capacity in METs is: 3.94 according to the [facility] Activity Status Index (DASI). We will get lexiscan myoview to evaluate for ischemia. Will get echocardiogram to evaluate heart function. Will await test results for further clearance.   Hypertension: BP is elevated today and remains elevated on my recheck.  She reports metoprolol discontinued by another provider.  We will have her start carvedilol 12.5 mg twice daily due to elevated BP and mild tachycardia.   Chronic HFpEF: She reports bloating, shortness of breath, and orthopnea. She denies improvement with increased Lasix 40mg  twice daily. Legs are edematous but soft. Volume status is difficult to assess due to body habitus but no JVD noted. Urine output is  variable. We will get echocardiogram to assess valve function and rule out heart failure. We will have her start carvedilol 12.5 mg twice daily due to elevated BP.  We will continue Diovan 80 mg daily and Lasix 40 mg twice daily. Would favor adjusting diuretic therapy based on echocardiogram results, and/or consider changing valsartan to [facility] or addition of MRA.   Hyperlipidemia LDL goal < 70: LDL 75 on 10/02/22.  LDL goal < 70 due to history of diabetes.  We will plan for repeat lipid testing at next office visit. Continue atorvastatin. Encouraged heart healthy diet avoiding saturated fat, simple carbohydrates, and processed food.   Obesity: Lengthy discussion about diet and weight loss. She is on Ozempic but has not had significant weight loss. Encouraged use of dietary handouts for goal of consuming a healthy diet with adequate protein and fiber to ensure satiety. Encouraged increased physical activity as tolerated.   Diabetes: A1C 6.4 on 08/26/22. She is on Metformin, Glipizide, and Ozempic. Continue current regimen. Consider dietary adjustments to improve weight management. Management per PCP.  Informed Consent   Shared Decision Making/Informed Consent The risks [chest pain, shortness of breath, cardiac arrhythmias, dizziness, blood pressure fluctuations, myocardial infarction, stroke/transient ischemic attack, nausea, vomiting, allergic reaction, radiation exposure, metallic taste sensation and life-threatening complications (estimated to be 1 in 10,000)], benefits (risk stratification, diagnosing coronary artery disease, treatment guidance) and alternatives of a nuclear stress test were discussed in detail with Ms. Lattanzio and she agrees to proceed.     Dispo: 2-3 months with me  Signed, Lori Bridegroom, NP-C

## 2023-06-22 ENCOUNTER — Encounter: Payer: Self-pay | Admitting: Nurse Practitioner

## 2023-06-22 ENCOUNTER — Ambulatory Visit: Payer: Medicaid Other | Attending: Cardiovascular Disease | Admitting: Nurse Practitioner

## 2023-06-22 VITALS — BP 148/80 | HR 94 | Ht 62.0 in | Wt 249.0 lb

## 2023-06-22 DIAGNOSIS — E785 Hyperlipidemia, unspecified: Secondary | ICD-10-CM | POA: Diagnosis not present

## 2023-06-22 DIAGNOSIS — Z01818 Encounter for other preprocedural examination: Secondary | ICD-10-CM | POA: Diagnosis not present

## 2023-06-22 DIAGNOSIS — I5032 Chronic diastolic (congestive) heart failure: Secondary | ICD-10-CM

## 2023-06-22 DIAGNOSIS — I10 Essential (primary) hypertension: Secondary | ICD-10-CM | POA: Diagnosis not present

## 2023-06-22 DIAGNOSIS — R0602 Shortness of breath: Secondary | ICD-10-CM

## 2023-06-22 DIAGNOSIS — E669 Obesity, unspecified: Secondary | ICD-10-CM

## 2023-06-22 DIAGNOSIS — E1169 Type 2 diabetes mellitus with other specified complication: Secondary | ICD-10-CM

## 2023-06-22 MED ORDER — CARVEDILOL 12.5 MG PO TABS
12.5000 mg | ORAL_TABLET | Freq: Two times a day (BID) | ORAL | 3 refills | Status: AC
Start: 2023-06-22 — End: 2024-06-30

## 2023-06-22 NOTE — Patient Instructions (Signed)
Medication Instructions:   DISCONTINUE  Metoprolol.  START Coreg one (1) tablet by mouth ( 12.5 mg) twice daily.  *If you need a refill on your cardiac medications before your next appointment, please call your pharmacy*   Lab Work:  None ordered.  If you have labs (blood work) drawn today and your tests are completely normal, you will receive your results only by: MyChart Message (if you have MyChart) OR A paper copy in the mail If you have any lab test that is abnormal or we need to change your treatment, we will call you to review the results.   Testing/Procedures:  Your physician has requested that you have an echocardiogram. Echocardiography is a painless test that uses sound waves to create images of your heart. It provides your doctor with information about the size and shape of your heart and how well your heart's chambers and valves are working. This procedure takes approximately one hour. There are no restrictions for this procedure. Please do NOT wear perfume, aftershave, or lotions (deodorant is allowed). Please arrive 15 minutes prior to your appointment time.  You are scheduled for a Myocardial Perfusion Imaging Study on Thursday, October 17 at 8:00 am.   Please arrive 15 minutes prior to your appointment time for registration and insurance purposes.   The test will take approximately 3 to 4 hours to complete; you may bring reading material. If someone comes with you to your appointment, they will need to remain in the main lobby due to limited space in the testing area.    How to prepare for your Myocardial Perfusion test:   Do not eat or drink 3 hours prior to your test, except you may have water.    Do not consume products containing caffeine (regular or decaffeinated) 12 hours prior to your test (ex: coffee, chocolate, soda, tea)   Do bring a list of your current medications with you. If not listed below, you may take your medications as normal.   Bring any  held medication to your appointment, as you may be required to take it once the test is complete.   Do wear comfortable clothes (no dresses or overalls) and walking shoes. Tennis shoes are preferred. No heels or open toed shoes.  Do not wear perfume, aftershave or lotions (deodorant is allowed).   If these instructions are not followed, you test will have to be rescheduled.   Please report to [address] for your test. If you have questions or concerns about your appointment, please call the Nuclear Lab at #[phone].  If you cannot keep your appointment, please provide 24 hour notification to the Nuclear lab to avoid a possible $50 charge to your account.        Follow-Up: At [facility], you and your health needs are our priority.  As part of our continuing mission to provide you with exceptional heart care, we have created designated Provider Care Teams.  These Care Teams include your primary Cardiologist (physician) and Advanced Practice Providers (APPs -  Physician Assistants and Nurse Practitioners) who all work together to provide you with the care you need, when you need it.  We recommend signing up for the patient portal called "MyChart".  Sign up information is provided on this After Visit Summary.  MyChart is used to connect with patients for Virtual Visits (Telemedicine).  Patients are able to view lab/test results, encounter notes, upcoming appointments, etc.  Non-urgent messages can be sent to your  provider as well.   To learn more about what you can do with MyChart, go to [email or web address].    Your next appointment:   3 month(s)  Provider:   Eligha Bridegroom, NP

## 2023-06-24 ENCOUNTER — Telehealth (HOSPITAL_COMMUNITY): Payer: Self-pay | Admitting: *Deleted

## 2023-06-24 NOTE — Telephone Encounter (Signed)
Pt given instructions for MPI study.

## 2023-07-01 ENCOUNTER — Ambulatory Visit (HOSPITAL_COMMUNITY): Payer: Medicaid Other | Attending: Cardiovascular Disease

## 2023-07-01 DIAGNOSIS — R0602 Shortness of breath: Secondary | ICD-10-CM | POA: Diagnosis present

## 2023-07-01 DIAGNOSIS — Z01818 Encounter for other preprocedural examination: Secondary | ICD-10-CM | POA: Diagnosis present

## 2023-07-01 DIAGNOSIS — I10 Essential (primary) hypertension: Secondary | ICD-10-CM

## 2023-07-01 MED ORDER — REGADENOSON 0.4 MG/5ML IV SOLN
0.4000 mg | Freq: Once | INTRAVENOUS | Status: AC
  Administered 2023-07-01: 0.4 mg via INTRAVENOUS

## 2023-07-01 MED ORDER — TECHNETIUM TC 99M TETROFOSMIN IV KIT
31.1000 | PACK | Freq: Once | INTRAVENOUS | Status: AC | PRN
  Administered 2023-07-01: 31.1 via INTRAVENOUS

## 2023-07-05 ENCOUNTER — Ambulatory Visit (HOSPITAL_COMMUNITY): Payer: Medicaid Other | Attending: Cardiology

## 2023-07-05 LAB — MYOCARDIAL PERFUSION IMAGING
LV dias vol: 126 mL (ref 46–106)
LV sys vol: 56 mL
Nuc Stress EF: 56 %
Peak HR: 91 {beats}/min
Rest HR: 76 {beats}/min
Rest Nuclear Isotope Dose: 25.2 mCi
SDS: 8
SRS: 2
SSS: 10
ST Depression (mm): 0 mm
Stress Nuclear Isotope Dose: 31.1 mCi
TID: 1.18

## 2023-07-05 MED ORDER — TECHNETIUM TC 99M TETROFOSMIN IV KIT
25.2000 | PACK | Freq: Once | INTRAVENOUS | Status: AC | PRN
  Administered 2023-07-05: 25.2 via INTRAVENOUS

## 2023-07-06 ENCOUNTER — Other Ambulatory Visit (HOSPITAL_COMMUNITY): Payer: Self-pay

## 2023-07-06 MED ORDER — OXYCODONE HCL 10 MG PO TABS
10.0000 mg | ORAL_TABLET | Freq: Every day | ORAL | 0 refills | Status: DC
Start: 2023-07-06 — End: 2023-08-03
  Filled 2023-07-06: qty 180, 30d supply, fill #0

## 2023-07-13 ENCOUNTER — Encounter (HOSPITAL_BASED_OUTPATIENT_CLINIC_OR_DEPARTMENT_OTHER): Payer: Self-pay | Admitting: Pulmonary Disease

## 2023-07-13 ENCOUNTER — Other Ambulatory Visit (HOSPITAL_BASED_OUTPATIENT_CLINIC_OR_DEPARTMENT_OTHER): Payer: Self-pay

## 2023-07-13 ENCOUNTER — Ambulatory Visit (HOSPITAL_BASED_OUTPATIENT_CLINIC_OR_DEPARTMENT_OTHER): Payer: Medicaid Other | Admitting: Pulmonary Disease

## 2023-07-13 VITALS — BP 128/74 | HR 80 | Ht <= 58 in | Wt 249.0 lb

## 2023-07-13 DIAGNOSIS — J449 Chronic obstructive pulmonary disease, unspecified: Secondary | ICD-10-CM | POA: Diagnosis not present

## 2023-07-13 DIAGNOSIS — G4733 Obstructive sleep apnea (adult) (pediatric): Secondary | ICD-10-CM

## 2023-07-13 MED ORDER — BREZTRI AEROSPHERE 160-9-4.8 MCG/ACT IN AERO: 2.0000 | INHALATION_SPRAY | Freq: Two times a day (BID) | RESPIRATORY_TRACT | 6 refills | Status: DC

## 2023-07-13 NOTE — Patient Instructions (Signed)
You are cleared for surgery with due risk  Continue on Breztri twice daily. Use albuterol for rescue.  You have to quit smoking!  Continue using your BiPAP every night during sleep

## 2023-07-13 NOTE — Assessment & Plan Note (Signed)
Continue Breztri. Use albuterol as needed6 for breakthrough Flu vaccination up-to-date

## 2023-07-13 NOTE — Assessment & Plan Note (Signed)
BiPAP download was reviewed which shows excellent control of events on auto bilevel settings with average pressure 9/6 cm and maximum 10/7 cm.  She is very compliant and BiPAP is only helped improve her daytime somnolence and fatigue. Weight loss encouraged, compliance with goal of at least 4-6 hrs every night is the expectation. Advised against medications with sedative side effects Cautioned against driving when sleepy - understanding that sleepiness will vary on a day to day basis

## 2023-07-13 NOTE — Progress Notes (Signed)
Subjective:    Patient ID: Lori Bean, female    DOB: 26-Aug-1971, 52 y.o.   MRN: [id number]  HPI  52  yo smoker for FU of chronic asthma & obstructive sleep apnea. PMH -  hypertensive heart disease, chronic diastolic heart failure.  -bipolar disorder and schizophrenia -chronic pain on oxycodone -chronic lymphedema   She was hospitalized 03/2021 for acute hypercarbic/hypoxic respiratory failure, UDS positive for THC.  Her BiPAP settings were adjusted to 15/8 , then to auto bilevel settings  hospitalized 08/2022 for COPD exacerbation, CT angiogram was negative for PE. She had an ED visit 11/23/2022 for COPD exacerbation  Chief Complaint  Patient presents with   Follow-up   She is being scheduled for elective surgery -revision and reimplantation of sacral nerve stimulator device by Dr. Maisie Fus and needs clearance. She continues to smoke 1 to 2 cigarettes/day.  She is compliant with her BiPAP machine, she obtained a new machine and uses it every night, no problems with mask or pressure. She is compliant with Breztri and uses albuterol MDI about twice daily for rescue. She is on Lasix twice daily She just found out about a spot on her breast and is awaiting verification mammograms.   Significant tests/ events reviewed   NPSG 06/2016  moderate OSA with AHI of 26/hour with lowest desaturation of 87%. This was corrected by CPAP of 14 cm with a small fullface mask.   07/2017 titration >> Bipap 16/12   07/20/2018 PFTs - FVC 1.77 (65%), FEV1 1.33 (60%), RATIO 75, DLCOcor 17.01 (78%)    07/04/18 Spirometry- FVC 1.6 (59%), FEV1 1.1 (50%), ratio 69   10/28/2017 Spirometry - FEV1 1.01 (45%)  Ratio 67  08/2022 CTA chest negative    Review of Systems neg for any significant sore throat, dysphagia, itching, sneezing, nasal congestion or excess/ purulent secretions, fever, chills, sweats, unintended wt loss, pleuritic or exertional cp, hempoptysis, orthopnea pnd or change in chronic  leg swelling. Also denies presyncope, palpitations, heartburn, abdominal pain, nausea, vomiting, diarrhea or change in bowel or urinary habits, dysuria,hematuria, rash, arthralgias, visual complaints, headache, numbness weakness or ataxia.     Objective:   Physical Exam  Gen. Pleasant, obese, in no distress ENT - no lesions, no post nasal drip Neck: No JVD, no thyromegaly, no carotid bruits Lungs: no use of accessory muscles, no dullness to percussion, decreased without rales or rhonchi  Cardiovascular: Rhythm regular, heart sounds  normal, no murmurs or gallops, 1+ peripheral edema Musculoskeletal: No deformities, no cyanosis or clubbing , no tremors       Assessment & Plan:    Preop clearance -COPD appears to be well-controlled on Breztri.  She will need routine perioperative precautions for OSA.  She is cleared for surgery with due risk.  I have advised her to take Breztri morning of surgery and take her BiPAP to the hospital in case she needs to be hospitalized

## 2023-07-15 ENCOUNTER — Ambulatory Visit (HOSPITAL_COMMUNITY): Payer: Medicaid Other | Attending: Internal Medicine

## 2023-07-15 ENCOUNTER — Telehealth: Payer: Self-pay | Admitting: Pulmonary Disease

## 2023-07-15 DIAGNOSIS — R0602 Shortness of breath: Secondary | ICD-10-CM | POA: Insufficient documentation

## 2023-07-15 DIAGNOSIS — Z01818 Encounter for other preprocedural examination: Secondary | ICD-10-CM | POA: Diagnosis present

## 2023-07-15 DIAGNOSIS — I10 Essential (primary) hypertension: Secondary | ICD-10-CM | POA: Diagnosis present

## 2023-07-15 LAB — ECHOCARDIOGRAM COMPLETE
Area-P 1/2: 3.5 cm2
S' Lateral: 3.3 cm

## 2023-07-15 NOTE — Telephone Encounter (Signed)
Note from 07/13/23 with Dr Vassie Loll (risk assessment) was faxed to [facility] attn Michel Bickers [phone].

## 2023-08-03 ENCOUNTER — Other Ambulatory Visit (HOSPITAL_COMMUNITY): Payer: Self-pay

## 2023-08-03 MED ORDER — OXYCODONE HCL 10 MG PO TABS
10.0000 mg | ORAL_TABLET | Freq: Every day | ORAL | 0 refills | Status: DC
Start: 2023-08-03 — End: 2023-08-31
  Filled 2023-08-03: qty 180, 30d supply, fill #0

## 2023-08-17 ENCOUNTER — Ambulatory Visit: Payer: Self-pay | Admitting: General Surgery

## 2023-08-23 ENCOUNTER — Encounter (HOSPITAL_BASED_OUTPATIENT_CLINIC_OR_DEPARTMENT_OTHER): Payer: Self-pay | Admitting: General Surgery

## 2023-08-23 ENCOUNTER — Other Ambulatory Visit: Payer: Self-pay

## 2023-08-23 NOTE — Progress Notes (Addendum)
Spoke w/ via phone for pre-op interview---Lori Bean needs dos----ISTAT         Bean results------06/22/23 EKG in Epic & chart COVID test -----patient states asymptomatic no test needed (Patient stated that she had a cold with cough, HA and nasal congestion that began on 08/04/23 and ended on 08/18/23. I reviewed this with Dr. Malen Gauze, MDA. Per Dr. Malen Gauze, [facility] to proceed with surgery on 08/27/23.) Arrive at -------0600 on Friday, 08/27/23 NPO after MN NO Solid Food.  Clear liquids from MN until---0500 Med rec completed Medications to take morning of surgery -----Abilify, Lipitor, Breztri inhaler, Wellbutrin , Carvedilol, Cymbalta, Gabapentin, Symbicort, Albuterol inhaler and nebulizer If needed you may take: Hydroxyzine, Robaxin, Oxycodone Diabetic medication -----Do NOT take any diabetic medications on the morning of surgery. Patient stated that her last dose of Ozempic was on 08/22/23. She stated that she was not told to stop. Reviewed w/ Dr. Malen Gauze, MDA. Per Dr. Malen Gauze, okay to proceed with surgery on 08/27/23. Patient instructed no nail polish to be worn day of surgery - Patient stated that she would not remove her nail polish (even one finger on each hand). She stated that she just had her nails done and refused to remove it. I made her aware of our policy and that we needed to monitor her O2 Sat. But she was still adamant that she would not take it off unless [facility] reimbursed her for the sixty dollars the manicure cost. Patient instructed to bring photo id and insurance card day of surgery Patient aware to have Driver (ride ) / caregiver    for 24 hours after surgery - daughter, Lori Bean Patient Special Instructions -----Bring albuterol inhaler. Bring Cpap and supplies. Pre-Op special Instructions -----none Patient verbalized understanding of instructions that were given at this phone interview. Patient denies chest pain, fever, cough at the interview.  Patient states that she gets sob with walking  or any exertional activity. She states that this has been going on for years. Patient has pulmonary clearance dated 07/13/23 by Dr. Cyril Mourning, pulmonologist and cardiac clearance dated 07/16/23 from Eligha Bridegroom, NP both in chart.  Anesthesia Review: HTN; chronic diastolic CHF; COPD (hx of multiple admissions for exacerbation's w/ respiratory failure, last COPD exacerbation on 11/23/22); OSA w/ bipap nightly; early state primary biliary choalngitis; BLE lymphedema; Schizophrenia/ bipolar disorder.  PCP: Randalyn Rhea, NP @ [facility] Cardiologist : Dr. Chilton Si, LOV 06/22/23 w/ NP Pulmonologist: Dr. Vassie Loll, LOV 07/13/23 Chest x-ray :04/28/23 EKG :06/22/23 Echo :07/15/23 Stress test: 07/05/23 Cardiac Cath : no Activity level: Patient states SOB w/ walking, states this has been her baseline for a few years. Sleep Study/ CPAP : yes / yes Fasting Blood Sugar :  90    / Checks Blood Sugar -- 3 times a day:   Blood Thinner/ Instructions /Last Dose: none ASA / Instructions/ Last Dose : none  Also, please see progress note dated 05/12/23 by Harless Litten, RN.

## 2023-08-26 ENCOUNTER — Ambulatory Visit (HOSPITAL_BASED_OUTPATIENT_CLINIC_OR_DEPARTMENT_OTHER): Payer: Medicaid Other | Admitting: Pulmonary Disease

## 2023-08-27 ENCOUNTER — Other Ambulatory Visit: Payer: Self-pay

## 2023-08-27 ENCOUNTER — Encounter (HOSPITAL_BASED_OUTPATIENT_CLINIC_OR_DEPARTMENT_OTHER)
Admission: RE | Disposition: A | Discharge status: Home / Self Care | Payer: Self-pay | Source: Home / Self Care | Attending: General Surgery

## 2023-08-27 ENCOUNTER — Ambulatory Visit (HOSPITAL_COMMUNITY): Payer: Medicaid Other

## 2023-08-27 ENCOUNTER — Ambulatory Visit (HOSPITAL_BASED_OUTPATIENT_CLINIC_OR_DEPARTMENT_OTHER)
Admission: RE | Admit: 2023-08-27 | Discharge: 2023-08-27 | Disposition: A | Discharge status: Home / Self Care | Payer: Medicaid Other | Attending: General Surgery | Admitting: General Surgery

## 2023-08-27 ENCOUNTER — Ambulatory Visit (HOSPITAL_BASED_OUTPATIENT_CLINIC_OR_DEPARTMENT_OTHER): Payer: Medicaid Other | Admitting: Certified Registered Nurse Anesthetist

## 2023-08-27 ENCOUNTER — Encounter (HOSPITAL_BASED_OUTPATIENT_CLINIC_OR_DEPARTMENT_OTHER): Payer: Self-pay | Admitting: General Surgery

## 2023-08-27 DIAGNOSIS — R159 Full incontinence of feces: Secondary | ICD-10-CM | POA: Insufficient documentation

## 2023-08-27 DIAGNOSIS — K219 Gastro-esophageal reflux disease without esophagitis: Secondary | ICD-10-CM | POA: Insufficient documentation

## 2023-08-27 DIAGNOSIS — F319 Bipolar disorder, unspecified: Secondary | ICD-10-CM | POA: Diagnosis not present

## 2023-08-27 DIAGNOSIS — F411 Generalized anxiety disorder: Secondary | ICD-10-CM | POA: Insufficient documentation

## 2023-08-27 DIAGNOSIS — F209 Schizophrenia, unspecified: Secondary | ICD-10-CM | POA: Diagnosis not present

## 2023-08-27 DIAGNOSIS — Z01818 Encounter for other preprocedural examination: Secondary | ICD-10-CM

## 2023-08-27 DIAGNOSIS — E66813 Obesity, class 3: Secondary | ICD-10-CM | POA: Diagnosis not present

## 2023-08-27 DIAGNOSIS — I110 Hypertensive heart disease with heart failure: Secondary | ICD-10-CM | POA: Insufficient documentation

## 2023-08-27 DIAGNOSIS — Z6841 Body Mass Index (BMI) 40.0 and over, adult: Secondary | ICD-10-CM | POA: Insufficient documentation

## 2023-08-27 DIAGNOSIS — I5032 Chronic diastolic (congestive) heart failure: Secondary | ICD-10-CM | POA: Diagnosis not present

## 2023-08-27 DIAGNOSIS — Z86718 Personal history of other venous thrombosis and embolism: Secondary | ICD-10-CM | POA: Insufficient documentation

## 2023-08-27 DIAGNOSIS — Z79899 Other long term (current) drug therapy: Secondary | ICD-10-CM | POA: Diagnosis not present

## 2023-08-27 DIAGNOSIS — F1120 Opioid dependence, uncomplicated: Secondary | ICD-10-CM | POA: Diagnosis not present

## 2023-08-27 DIAGNOSIS — Z7984 Long term (current) use of oral hypoglycemic drugs: Secondary | ICD-10-CM | POA: Diagnosis not present

## 2023-08-27 DIAGNOSIS — J4489 Other specified chronic obstructive pulmonary disease: Secondary | ICD-10-CM | POA: Diagnosis not present

## 2023-08-27 DIAGNOSIS — E1140 Type 2 diabetes mellitus with diabetic neuropathy, unspecified: Secondary | ICD-10-CM | POA: Insufficient documentation

## 2023-08-27 HISTORY — DX: Unspecified asthma, uncomplicated: J45.909

## 2023-08-27 HISTORY — DX: Presence of spectacles and contact lenses: Z97.3

## 2023-08-27 HISTORY — DX: Headache, unspecified: R51.9

## 2023-08-27 LAB — POCT I-STAT, CHEM 8
BUN: 10 mg/dL (ref 6–20)
Calcium, Ion: 1.26 mmol/L (ref 1.15–1.40)
Chloride: 100 mmol/L (ref 98–111)
Creatinine, Ser: 0.6 mg/dL (ref 0.44–1.00)
Glucose, Bld: 104 mg/dL — ABNORMAL HIGH (ref 70–99)
HCT: 42 % (ref 36.0–46.0)
Hemoglobin: 14.3 g/dL (ref 12.0–15.0)
Potassium: 3.7 mmol/L (ref 3.5–5.1)
Sodium: 139 mmol/L (ref 135–145)
TCO2: 24 mmol/L (ref 22–32)

## 2023-08-27 LAB — GLUCOSE, CAPILLARY: Glucose-Capillary: 95 mg/dL (ref 70–99)

## 2023-08-27 SURGERY — INSERTION, NEUROSTIMULATOR, SACRAL
Anesthesia: Monitor Anesthesia Care | Site: Back

## 2023-08-27 MED ORDER — LACTATED RINGERS IV SOLN
INTRAVENOUS | Status: DC
Start: 2023-08-27 — End: 2023-08-27

## 2023-08-27 MED ORDER — MIDAZOLAM HCL 2 MG/2ML IJ SOLN
INTRAMUSCULAR | Status: AC
  Filled 2023-08-27: qty 2

## 2023-08-27 MED ORDER — FENTANYL CITRATE (PF) 100 MCG/2ML IJ SOLN
INTRAMUSCULAR | Status: AC
  Filled 2023-08-27: qty 2

## 2023-08-27 MED ORDER — ONDANSETRON HCL 4 MG/2ML IJ SOLN: 4.0000 mg | Freq: Once | INTRAMUSCULAR | Status: DC | PRN

## 2023-08-27 MED ORDER — ACETAMINOPHEN 500 MG PO TABS
ORAL_TABLET | ORAL | Status: AC
Start: 2023-08-27 — End: ?
  Filled 2023-08-27: qty 2

## 2023-08-27 MED ORDER — SODIUM CHLORIDE 0.9 % IV SOLN: INTRAVENOUS | Status: DC

## 2023-08-27 MED ORDER — DEXAMETHASONE SODIUM PHOSPHATE 10 MG/ML IJ SOLN
INTRAMUSCULAR | Status: AC
  Filled 2023-08-27: qty 1

## 2023-08-27 MED ORDER — OXYCODONE HCL 5 MG PO TABS
5.0000 mg | ORAL_TABLET | Freq: Once | ORAL | Status: DC | PRN
Start: 2023-08-27 — End: 2023-08-27

## 2023-08-27 MED ORDER — CEFAZOLIN SODIUM-DEXTROSE 2-4 GM/100ML-% IV SOLN
2.0000 g | INTRAVENOUS | Status: AC
  Administered 2023-08-27: 2 g via INTRAVENOUS

## 2023-08-27 MED ORDER — MIDAZOLAM HCL 5 MG/5ML IJ SOLN
INTRAMUSCULAR | Status: DC | PRN
  Administered 2023-08-27: 2 mg via INTRAVENOUS

## 2023-08-27 MED ORDER — LIDOCAINE HCL (CARDIAC) PF 100 MG/5ML IV SOSY
PREFILLED_SYRINGE | INTRAVENOUS | Status: DC | PRN
  Administered 2023-08-27: 60 mg via INTRAVENOUS

## 2023-08-27 MED ORDER — ONDANSETRON HCL 4 MG/2ML IJ SOLN
INTRAMUSCULAR | Status: DC | PRN
  Administered 2023-08-27: 4 mg via INTRAVENOUS

## 2023-08-27 MED ORDER — PROPOFOL 500 MG/50ML IV EMUL
INTRAVENOUS | Status: DC | PRN
  Administered 2023-08-27: 50 ug/kg/min via INTRAVENOUS

## 2023-08-27 MED ORDER — BUPIVACAINE-EPINEPHRINE 0.5% -1:200000 IJ SOLN
INTRAMUSCULAR | Status: DC | PRN
  Administered 2023-08-27: 26 mL

## 2023-08-27 MED ORDER — PROPOFOL 1000 MG/100ML IV EMUL
INTRAVENOUS | Status: AC
Start: 2023-08-27 — End: ?
  Filled 2023-08-27: qty 100

## 2023-08-27 MED ORDER — STERILE WATER FOR IRRIGATION IR SOLN
Status: DC | PRN
  Administered 2023-08-27: 500 mL

## 2023-08-27 MED ORDER — FENTANYL CITRATE (PF) 100 MCG/2ML IJ SOLN: 25.0000 ug | INTRAMUSCULAR | Status: DC | PRN

## 2023-08-27 MED ORDER — DEXMEDETOMIDINE HCL IN NACL 80 MCG/20ML IV SOLN
INTRAVENOUS | Status: DC | PRN
  Administered 2023-08-27 (×2): 4 ug via INTRAVENOUS

## 2023-08-27 MED ORDER — FENTANYL CITRATE (PF) 100 MCG/2ML IJ SOLN
INTRAMUSCULAR | Status: DC | PRN
  Administered 2023-08-27: 12.5 ug via INTRAVENOUS
  Administered 2023-08-27: 25 ug via INTRAVENOUS
  Administered 2023-08-27 (×5): 12.5 ug via INTRAVENOUS

## 2023-08-27 MED ORDER — SODIUM CHLORIDE 0.9% FLUSH: 3.0000 mL | Freq: Two times a day (BID) | INTRAVENOUS | Status: DC

## 2023-08-27 MED ORDER — LIDOCAINE HCL (PF) 2 % IJ SOLN
INTRAMUSCULAR | Status: AC
Start: 2023-08-27 — End: ?
  Filled 2023-08-27: qty 5

## 2023-08-27 MED ORDER — ACETAMINOPHEN 500 MG PO TABS
1000.0000 mg | ORAL_TABLET | Freq: Once | ORAL | Status: AC
  Administered 2023-08-27: 1000 mg via ORAL

## 2023-08-27 MED ORDER — OXYCODONE HCL 5 MG/5ML PO SOLN: 5.0000 mg | Freq: Once | ORAL | Status: DC | PRN

## 2023-08-27 MED ORDER — CEFAZOLIN SODIUM-DEXTROSE 2-4 GM/100ML-% IV SOLN
INTRAVENOUS | Status: AC
Start: 2023-08-27 — End: ?
  Filled 2023-08-27: qty 100

## 2023-08-27 SURGICAL SUPPLY — 45 items
BLADE SURG 15 STRL LF DISP TIS (BLADE) ×1 IMPLANT
CABLE EXTEN 4.32 INTERSTIM (NEUROSURGERY SUPPLIES) IMPLANT
CHLORAPREP W/TINT 26 (MISCELLANEOUS) ×1 IMPLANT
COVER BACK TABLE 60X90IN (DRAPES) ×1 IMPLANT
COVER MAYO STAND STRL (DRAPES) ×1 IMPLANT
COVER PROBE U/S 5X48 (MISCELLANEOUS) IMPLANT
DERMABOND ADVANCED .7 DNX12 (GAUZE/BANDAGES/DRESSINGS) ×1 IMPLANT
DRAPE C-ARM 42X120 X-RAY (DRAPES) IMPLANT
DRAPE INCISE IOBAN 66X45 STRL (DRAPES) ×1 IMPLANT
DRAPE LAPAROSCOPIC ABDOMINAL (DRAPES) ×1 IMPLANT
DRAPE SHEET LG 3/4 BI-LAMINATE (DRAPES) IMPLANT
DRAPE UTILITY XL STRL (DRAPES) ×1 IMPLANT
DRSG TEGADERM 4X4.75 (GAUZE/BANDAGES/DRESSINGS) IMPLANT
ELECT REM PT RETURN 9FT ADLT (ELECTROSURGICAL) ×1
ELECTRODE REM PT RTRN 9FT ADLT (ELECTROSURGICAL) ×1 IMPLANT
ENVELOPE ABSORB ANTIBACTERIAL (Mesh General) ×1 IMPLANT
GAUZE 4X4 16PLY ~~LOC~~+RFID DBL (SPONGE) ×1 IMPLANT
GAUZE SPONGE 4X4 12PLY STRL (GAUZE/BANDAGES/DRESSINGS) IMPLANT
GLOVE BIO SURGEON STRL SZ 6.5 (GLOVE) ×1 IMPLANT
GLOVE BIOGEL PI IND STRL 7.0 (GLOVE) ×1 IMPLANT
GLOVE INDICATOR 6.5 STRL GRN (GLOVE) ×1 IMPLANT
GOWN STRL REUS W/TWL XL LVL3 (GOWN DISPOSABLE) ×1 IMPLANT
KIT HANDSET INTERSTIM COMM (NEUROSURGERY SUPPLIES) IMPLANT
KIT REVISION INTERSTIM (MISCELLANEOUS) IMPLANT
KIT TURNOVER CYSTO (KITS) ×1 IMPLANT
LEAD INTERSTIM 4.32 28 L (Lead) IMPLANT
NDL FORAMN 5 20GA (NEUROSURGERY SUPPLIES) IMPLANT
NDL HYPO 22X1.5 SAFETY MO (MISCELLANEOUS) ×1 IMPLANT
NEEDLE FORAMN 5 20GA (NEUROSURGERY SUPPLIES)
NEEDLE HYPO 22X1.5 SAFETY MO (MISCELLANEOUS) ×1
NEUROSTIMULATOR 1.7X2X.06 (UROLOGICAL SUPPLIES) IMPLANT
PACK BASIN DAY SURGERY FS (CUSTOM PROCEDURE TRAY) ×1 IMPLANT
PAD ARMBOARD 7.5X6 YLW CONV (MISCELLANEOUS) IMPLANT
PENCIL SMOKE EVACUATOR (MISCELLANEOUS) ×1 IMPLANT
POUCH TYRX ANTIBAC NEURO MED (Mesh General) IMPLANT
SLEEVE SCD COMPRESS KNEE MED (STOCKING) ×1 IMPLANT
STRIP CLOSURE SKIN 1/2X4 (GAUZE/BANDAGES/DRESSINGS) IMPLANT
SUT SILK 2-0 18XBRD TIE 12 (SUTURE) IMPLANT
SUT VIC AB 3-0 SH 27X BRD (SUTURE) ×1 IMPLANT
SUT VIC AB 4-0 PS2 18 (SUTURE) ×1 IMPLANT
SYR BULB IRRIG 60ML STRL (SYRINGE) ×1 IMPLANT
SYR CONTROL 10ML LL (SYRINGE) ×1 IMPLANT
TOWEL OR 17X24 6PK STRL BLUE (TOWEL DISPOSABLE) ×1 IMPLANT
TUBE CONNECTING 12X1/4 (SUCTIONS) IMPLANT
WATER STERILE IRR 500ML POUR (IV SOLUTION) ×1 IMPLANT

## 2023-08-27 NOTE — Discharge Instructions (Addendum)
GENERAL SURGERY: POST OP INSTRUCTIONS  DIET: Follow a light bland diet the first 24 hours after arrival home, such as soup, liquids, crackers, etc.  Be sure to include lots of fluids daily.  Avoid fast food or heavy meals as your are more likely to get nauseated.   Take your usually prescribed home medications unless otherwise directed. PAIN CONTROL: Pain is best controlled by a usual combination of three different methods TOGETHER: Ice/Heat Over the counter pain medication Prescription pain medication Most patients will experience some swelling and bruising around the incisions.  Ice packs or heating pads (30-60 minutes up to 6 times a day) will help. Use ice for the first few days to help decrease swelling and bruising, then switch to heat to help relax tight/sore spots and speed recovery.  Some people prefer to use ice alone, heat alone, alternating between ice & heat.  Experiment to what works for you.  Swelling and bruising can take several weeks to resolve.   It is helpful to take an over-the-counter pain medication regularly for the first few weeks.  Choose one of the following that works best for you: Naproxen (Aleve, etc)  Two 220mg  tabs twice a day Ibuprofen (Advil, etc) Three 200mg  tabs four times a day (every meal & bedtime) Take your pain medication as prescribed.  If you are having problems/concerns with the prescription medicine (does not control pain, nausea, vomiting, rash, itching, etc), please call us [phone] to see if we need to switch you to a different pain medicine that will work better for you and/or control your side effect better. If you need a refill on your pain medication, please contact your pharmacy.  They will contact our office to request authorization. Prescriptions will not be filled after 5 pm or on week-ends. Avoid getting constipated.  Between the surgery and the pain medications, it is common to experience some constipation.  Increasing fluid intake and  taking a fiber supplement (such as Metamucil, Citrucel, FiberCon, MiraLax, etc) 1-2 times a day regularly will usually help prevent this problem from occurring.  A mild laxative (prune juice, Milk of Magnesia, MiraLax, etc) should be taken according to package directions if there are no bowel movements after 48 hours.   Wash / shower every day.  You may shower over the dressings as they are waterproof.  Continue to shower over incision(s) after the dressing is off. Skin glue will wear off in 7-10 days.  You may replace a dressing/Band-Aid to cover the incision for comfort if you wish.      ACTIVITIES as tolerated:   You may resume regular (light) daily activities beginning the next day--such as daily self-care, walking, climbing stairs--gradually increasing activities as tolerated.  If you can walk 30 minutes without difficulty, it is safe to try more intense activity such as jogging, treadmill, bicycling, low-impact aerobics, swimming, etc. Save the most intensive and strenuous activity for last such as sit-ups, heavy lifting, contact sports, etc  Refrain from any heavy lifting or straining until you are off narcotics for pain control.   DO NOT PUSH THROUGH PAIN.  Let pain be your guide: If it hurts to do something, don't do it.  Pain is your body warning you to avoid that activity for another week until the pain goes down. You may drive when you are no longer taking prescription pain medication, you can comfortably wear a seatbelt, and you can safely maneuver your car and apply brakes. You may have sexual intercourse when  it is comfortable.  FOLLOW UP in our office Please call [facility] at [phone] to set up an appointment to see your surgeon in the office for a follow-up appointment approximately 2-3 weeks after your surgery. Make sure that you call for this appointment the day you arrive home to insure a convenient appointment time. 9. IF YOU HAVE DISABILITY OR FAMILY LEAVE FORMS, BRING THEM TO  THE OFFICE FOR PROCESSING.  DO NOT GIVE THEM TO YOUR DOCTOR.   WHEN TO CALL us [phone]: Poor pain control Reactions / problems with new medications (rash/itching, nausea, etc)  Fever over 101.5 F (38.5 C) Worsening swelling or bruising Continued bleeding from incision. Increased pain, redness, or drainage from the incision   The clinic staff is available to answer your questions during regular business hours (8:30am-5pm).  Please don't hesitate to call and ask to speak to one of our nurses for clinical concerns.   If you have a medical emergency, go to the nearest emergency room or call 911.  A surgeon from [facility] is always on call at the [facility], [place] [address], [place], [place]  [address] ? MAIN: [phone] ? TOLL FREE: [phone] ?  FAX [phone] [email or web address]         No acetaminophen/Tylenol until after 1:30 pm today if needed.     Post Anesthesia Home Care Instructions  Activity: Get plenty of rest for the remainder of the day. A responsible individual must stay with you for 24 hours following the procedure.  For the next 24 hours, DO NOT: -Drive a car -Advertising copywriter -Drink alcoholic beverages -Take any medication unless instructed by your physician -Make any legal decisions or sign important papers.  Meals: Start with liquid foods such as gelatin or soup. Progress to regular foods as tolerated. Avoid greasy, spicy, heavy foods. If nausea and/or vomiting occur, drink only clear liquids until the nausea and/or vomiting subsides. Call your physician if vomiting continues.  Special Instructions/Symptoms: Your throat may feel dry or sore from the anesthesia or the breathing tube placed in your throat during surgery. If this causes discomfort, gargle with warm salt water. The discomfort should disappear within 24 hours.

## 2023-08-27 NOTE — H&P (Signed)
PROVIDER:  Elenora Gamma, MD   MRN: [id number] DOB: 07/29/1971  Interval History:    Patient underwent temporary InterStim placement at outside facility and developed improvement in her urinary and bowel symptoms. She had greater then 50% improvement in urinary leaks and fecal accidents. She decided to proceed with permanent sacral neurostimulator placement.  This was done on 09/25/22.  After surgery, she reported continued improvement with leakage of urine and stool.  She returned to the office in Aug with a feeling that the battery is moving around in the pocket and is having more episodes of leakage, 2-3 episodes a day.  A pelvic X ray shows that her lead has pulled out.    Past Medical History:  Diagnosis Date   Abdominal hernia    Asthma    Bipolar affective disorder (HCC)    Breast pain in female    09-23-2022  per pt right side due to previous chronic cough which resolved   Chronic diastolic CHF (congestive heart failure) (HCC) 2013   followed by dr t. [facility] Salvia   Chronic pain syndrome    COPD mixed type [facility])    pulmonologist--- dr Vassie Loll;  pt not on any oxygen;   still smokes;    last exacerbation ED visit 11-23-2022 no admission but followed up w/ Dr Vassie Loll 12-14-2022 treated;   prior to this admission w/ ARF 08-26-2022   Depression    follows w/ PCP   GAD (generalized anxiety disorder)    GERD (gastroesophageal reflux disease)    Headache    occasional   Heart murmur    Hepatic steatosis    followed by GI w/ [facility] ;    liver bx 05-14-2022 mild active steatohepatitis, fibrosis stage 0-1, early stages primary biliary cholangitis   History of acute respiratory failure    multiple admission for  COPd/ asthma exacerbation's w/ failure (09-22-2022  last admission 08-26-2022)   History of DVT (deep vein thrombosis)    05-02-2012  RUL thrombosis cephalic vein and 05-08-2012  LUE  superficial thrombus bachial vein PICC line site;  01-30-2014   LLE   History of  substance abuse (HCC)    crack/ opiates/ cannibus  (08-23-2023  pt stated last used any type 2016)   Hyperlipidemia, mixed    Hypertension    coronary CT 04-01-2021   calcium score=zero   Incontinence of feces with fecal urgency    s/p  stage one sacrum nerve stimulator  09/ 2023 by dr Maisie Fus in office   Lymphedema of both lower extremities 06/07/2018   per pt wraps legs nightly   Morbid (severe) obesity due to excess calories (HCC)    Neuropathy    diabetic neuropathy and left leg post op lumbar surgery   OA (osteoarthritis)    OSA treated with BiPAP    pulmonologist--- dr Vassie Loll;   per pt uses nightly   Overactive bladder    Schizophrenia (HCC)    Type 2 diabetes mellitus (HCC)    followed by pcp;   (08/23/23 -pt stated she checks BS tid, average fasting blood surgar of 90   Wears glasses    Past Surgical History:  Procedure Laterality Date   CHOLECYSTECTOMY, LAPAROSCOPIC  11/09/2007   @[facility]  by dr Ezzard Standing   COLONOSCOPY WITH PROPOFOL N/A 01/23/2016   Procedure: COLONOSCOPY WITH PROPOFOL;  Surgeon: Graylin Shiver, MD;  Location: [facility]COPY;  Service: Endoscopy;  Laterality: N/A;   KNEE ARTHROSCOPY WITH MENISCAL REPAIR Right 09/28/2016   Procedure: KNEE ARTHROSCOPY  WITH MENISCAL REPAIR;  Surgeon: Jodi Geralds, MD;  Location: [facility] OR;  Service: Orthopedics;  Laterality: Right;  Right partial meniscectomy and chondroplasty, patellar/femoral joint and medial femoral condyle    LUMBAR LAMINECTOMY/DECOMPRESSION MICRODISCECTOMY  04/01/2012   Procedure: LUMBAR LAMINECTOMY/DECOMPRESSION MICRODISCECTOMY 2 LEVELS;  Surgeon: Reinaldo Meeker, MD;  Location: [facility] NEURO ORS;  Service: Neurosurgery;  Laterality: Left;  Lumbar four-five, lumbar five sacral one microdiscectomy    LUMBAR WOUND DEBRIDEMENT  04/29/2012   Procedure: LUMBAR WOUND DEBRIDEMENT;  Surgeon: Reinaldo Meeker, MD;  Location: [facility] NEURO ORS;  Service: Neurosurgery;  Laterality: N/A;  lumbar wound debridement   ORIF ORBITAL FRACTURE Right 1989    per pt has retained hardware   POSTERIOR FUSION LUMBAR SPINE  12/28/2013   @[facility]  by dr Gerlene Fee;   bilateral L5-S1 laminectomy / discectomy/ decompression / fusion   ROTATOR CUFF REPAIR Right    yrs ago   SACRAL NERVE STIMULATOR IMPLANTATION  09/25/2022   [facility] - Dr. Maisie Fus   TUBAL LIGATION Bilateral 1999   PPTL   Family History  Problem Relation Age of Onset   Diabetes Mother    Hypertension Mother    Diabetes Father    Heart disease Paternal Aunt    Cancer Paternal Aunt    Esophageal cancer Neg Hx    Stomach cancer Neg Hx    Social History   Socioeconomic History   Marital status: Divorced    Spouse name: Not on file   Number of children: 2   Years of education: 12   Highest education level: Not on file  Occupational History    Comment: disabled  Tobacco Use   Smoking status: Every Day    Current packs/day: 0.50    Average packs/day: 0.5 packs/day for 25.0 years (12.5 ttl pk-yrs)    Types: Cigarettes    Passive exposure: Current   Smokeless tobacco: Never   Tobacco comments:    09-23-2022  per pt trying to quit again ,  has smoked off and on multiple times / 08/23/23 smokes 1/2ppd  Vaping Use   Vaping status: Former   Devices: per pt quit 2016  Substance and Sexual Activity   Alcohol use: Not Currently    Comment: quit Nov. 2014   Drug use: Not Currently    Types: Cocaine, "Crack" cocaine, Marijuana    Comment: 08/23/23  per pt last use of any type 2016   Sexual activity: Yes    Birth control/protection: Surgical, Post-menopausal  Other Topics Concern   Not on file  Social History Narrative   Lives alone   Caffeine - coffee 1 cup daily   Social Drivers of Corporate investment banker Strain: Not on file  Food Insecurity: No Food Insecurity (08/26/2022)   Hunger Vital Sign    Worried About Running Out of Food in the Last Year: Never true    Ran Out of Food in the Last Year: Never true  Transportation Needs: No Transportation Needs  (08/26/2022)   PRAPARE - Administrator, Civil Service (Medical): No    Lack of Transportation (Non-Medical): No  Physical Activity: Not on file  Stress: Not on file  Social Connections: Not on file  Intimate Partner Violence: Not At Risk (08/26/2022)   Humiliation, Afraid, Rape, and Kick questionnaire    Fear of Current or Ex-Partner: No    Emotionally Abused: No    Physically Abused: No    Sexually Abused: No  Current Meds  Medication Sig   albuterol (PROVENTIL) (2.5 MG/3ML) 0.083% nebulizer solution Take 2.5 mg by nebulization every 6 (six) hours as needed for wheezing or shortness of breath.   albuterol (VENTOLIN HFA) 108 (90 Base) MCG/ACT inhaler Inhale 1-2 puffs into the lungs every 6 (six) hours as needed for wheezing or shortness of breath. (Patient taking differently: Inhale 1-2 puffs into the lungs every 6 (six) hours as needed for wheezing or shortness of breath.)   ARIPiprazole (ABILIFY) 20 MG tablet Take 1 tablet (20 mg total) by mouth daily.   atorvastatin (LIPITOR) 40 MG tablet Take 1 tablet (40 mg total) by mouth daily. (Patient taking differently: Take 40 mg by mouth daily.)   Budeson-Glycopyrrol-Formoterol (BREZTRI AEROSPHERE) 160-9-4.8 MCG/ACT AERO Inhale 2 puffs into the lungs in the morning and at bedtime.   buPROPion (WELLBUTRIN XL) 150 MG 24 hr tablet Take 150 mg by mouth daily.   carvedilol (COREG) 12.5 MG tablet Take 1 tablet (12.5 mg total) by mouth 2 (two) times daily.   DULoxetine (CYMBALTA) 20 MG capsule Take 20 mg by mouth 2 (two) times daily.   ergocalciferol (VITAMIN D2) 1.25 MG (50000 UT) capsule Take 1 capsule (50,000 Units total) by mouth once a week. (Patient taking differently: Take 50,000 Units by mouth once a week. Monday)   furosemide (LASIX) 40 MG tablet Take 1 tablet (40 mg total) by mouth daily. Please take 40mg  twice daily x2 days, then 40mg  daily   gabapentin (NEURONTIN) 300 MG capsule Take 300 mg by mouth 3 (three) times daily.    glipiZIDE (GLUCOTROL XL) 10 MG 24 hr tablet Take 10 mg by mouth daily with breakfast.   hydrOXYzine (ATARAX) 25 MG tablet Take 25 mg by mouth as needed.   methocarbamol (ROBAXIN) 500 MG tablet Take 1 tablet (500 mg total) by mouth 3 (three) times daily as needed for muscle spasms.   Oxycodone HCl 10 MG TABS Take 1 tablet (10 mg total) by mouth 6 (six) times daily as needed for pain.   potassium chloride (KLOR-CON) 10 MEQ tablet Take 10 mEq by mouth 2 (two) times daily.   SYMBICORT 160-4.5 MCG/ACT inhaler Inhale 2 puffs into the lungs in the morning and at bedtime.   Triamcinolone Acetonide 0.025 % LOTN Apply 1 Application topically 2 (two) times daily as needed.   valsartan (DIOVAN) 80 MG tablet Take 1 tablet (80 mg total) by mouth daily.   Allergies  Allergen Reactions   Aspirin Nausea And Vomiting    GI upset   Review of Systems - Negative except as stated above     Physical Examination:    Gen: NAD CV: RRR Pulm: CTA Abd: soft Incision: Battery appears to be dislodging upwards.  Freely moves within the pocket.   Assessment and Plan:    Lori Bean is a 52 y.o. female who underwent sacral nerve stimulator placement for urinary and fecal incontinence.  She reports she is having some trouble with battery moving in the pocket.  She is also having more leaks currently.  I have recommended revision of the wire and implantation of the device deeper in the pocket.  We discussed risks of the procedure including bleeding, infection, damage to adjacent structures and device malfunction.  Vanita Panda, MD  Colorectal and General Surgery [facility]

## 2023-08-27 NOTE — Anesthesia Postprocedure Evaluation (Signed)
Anesthesia Post Note  Patient: Lori Bean  Procedure(s) Performed: REVISION OF SACRAL NERVE STIMULATOR IMPLANTATION DEVICE (Back)     Patient location during evaluation: PACU Anesthesia Type: MAC Level of consciousness: awake and alert Pain management: pain level controlled Vital Signs Assessment: post-procedure vital signs reviewed and stable Respiratory status: spontaneous breathing, nonlabored ventilation and respiratory function stable Cardiovascular status: stable and blood pressure returned to baseline Anesthetic complications: no   No notable events documented.  Last Vitals:  Vitals:   08/27/23 1045 08/27/23 1100  BP: 139/83 136/72  Pulse: 68 80  Resp: 20 16  Temp:    SpO2: 94% 97%    Last Pain:  Vitals:   08/27/23 1100  TempSrc:   PainSc: 0-No pain                 Beryle Lathe

## 2023-08-27 NOTE — Anesthesia Preprocedure Evaluation (Addendum)
Anesthesia Evaluation  Patient identified by MRN, date of birth, ID band Patient awake    Reviewed: Allergy & Precautions, NPO status , Patient's Chart, lab work & pertinent test results  History of Anesthesia Complications Negative for: history of anesthetic complications  Airway Mallampati: I  TM Distance: >3 FB Neck ROM: Full    Dental  (+) Dental Advisory Given   Pulmonary asthma , sleep apnea , COPD, Current SmokerPatient did not abstain from smoking.   Pulmonary exam normal        Cardiovascular hypertension, Pt. on home beta blockers and Pt. on medications + DVT  Normal cardiovascular exam   '24 TTE - EF 65 to 70%. Left ventricular diastolic parameters are indeterminate. Trivial mitral valve regurgitation.     Neuro/Psych  Headaches PSYCHIATRIC DISORDERS Anxiety Depression Bipolar Disorder Schizophrenia     GI/Hepatic ,GERD  Controlled and Medicated,,(+)     substance abuse  cocaine use and marijuana use Incontinence of feces with fecal urgency    Endo/Other  diabetes, Type 2, Oral Hypoglycemic Agents  Class 3 obesity  Renal/GU negative Renal ROS     Musculoskeletal  (+) Arthritis ,  narcotic dependent  Abdominal  (+) + obese  Peds  Hematology negative hematology ROS (+)   Anesthesia Other Findings On GLP-1a   Reproductive/Obstetrics                              Anesthesia Physical Anesthesia Plan  ASA: 3  Anesthesia Plan: MAC   Post-op Pain Management: Tylenol PO (pre-op)*   Induction:   PONV Risk Score and Plan: 2 and Propofol infusion and Treatment may vary due to age or medical condition  Airway Management Planned: Natural Airway and Simple Face Mask  Additional Equipment: None  Intra-op Plan:   Post-operative Plan:   Informed Consent: I have reviewed the patients History and Physical, chart, labs and discussed the procedure including the risks, benefits  and alternatives for the proposed anesthesia with the patient or authorized representative who has indicated his/her understanding and acceptance.       Plan Discussed with: CRNA and Anesthesiologist  Anesthesia Plan Comments:          Anesthesia Quick Evaluation

## 2023-08-27 NOTE — Op Note (Signed)
08/27/2023  10:22 AM  PATIENT:  Lori Bean  52 y.o. female  Patient Care Team: Bettey Costa, NP as PCP - General (Nurse Practitioner) Chilton Si, MD as PCP - Cardiology (Cardiology) Aliene Beams, MD as Referring Physician (Neurosurgery) Jodi Geralds, MD as Consulting Physician (Orthopedic Surgery) Alver Sorrow, NP as Nurse Practitioner (Cardiology) Romie Levee, MD as Consulting Physician (General Surgery)  PRE-OPERATIVE DIAGNOSIS:  FECAL INCONTINENCE  POST-OPERATIVE DIAGNOSIS:  FECAL INCONTINENCE  PROCEDURE:  REVISION AND REIMPLANTATION SACRAL NERVE STIMULATOR DEVICE    Surgeon(s): Romie Levee, MD  ASSISTANT: none   ANESTHESIA:   local and MAC  EBL:  5ml    SPECIMEN:  No Specimen  DISPOSITION OF SPECIMEN:  N/A  COUNTS:  YES  PLAN OF CARE: Discharge to home after PACU  PATIENT DISPOSITION:  PACU - hemodynamically stable.  INDICATION: 52 y.o. F with SNS for fecal incontinence.  She complains of her battery twisting inside the pouch and she is having more trouble with incontinence.  Pelvic Xray shows lead may be displaced.   OR FINDINGS: Lead appears to be in correct position.  Impedence normal.  All 4 leads function when tested in the OR  DESCRIPTION: the patient was identified in the preoperative holding area and taken to the OR where they were laid prone on the operating room table.  MAC anesthesia was induced without difficulty. SCDs were also noted to be in place prior to the initiation of anesthesia.  The patient was then prepped and draped in the usual sterile fashion.   A surgical timeout was performed indicating the correct patient, procedure, positioning and need for preoperative antibiotics.  I began by making an incision over her previous scar using a 15 blade scalpel.  This was carried down to the level of the subcutaneous tissues.  The pouch was identified and opened and the battery was removed from the pouch.  I then  dissected down under the pouch using blunt dissection and electrocautery until we were approximately 1.5 inches below the surface of the skin.  Hemostasis was controlled using electrocautery.  The lead was then removed from the battery.  At this point we used the C arm to check an AP and lateral view.  On AP view there was good positioning of the wire.  On lateral view the leads all appear to be past the edge of the sacrum.  This was felt to be in correct position and we proceeded to test each lead.  All 4 leads had good response with toe flexion and bellows.  This was felt to be a functioning lead and no revision was necessary.  The new lead was then placed back into the battery and a Tyrex pouch was brought onto the field.  The battery was placed in the Tyrex pouch and this was then placed into the deeper pocket.  A 3-0 Vicryl suture was used to close the pocket over this.  I then used a 4-0 Vicryl subcuticular suture to close the skin.  Dermabond was placed over this and the patient was then awakened from anesthesia and sent to the postanesthesia care unit in stable condition.  All counts were correct per operating room staff.  Vanita Panda, MD  Colorectal and General Surgery [facility]

## 2023-08-27 NOTE — Anesthesia Procedure Notes (Signed)
Procedure Name: MAC Date/Time: 08/27/2023 9:26 AM  Performed by: Jessica Priest, CRNAPre-anesthesia Checklist: Timeout performed, Patient being monitored, Suction available, Emergency Drugs available and Patient identified Patient Re-evaluated:Patient Re-evaluated prior to induction Oxygen Delivery Method: Simple face mask Preoxygenation: Pre-oxygenation with 100% oxygen Induction Type: IV induction Placement Confirmation: breath sounds checked- equal and bilateral, CO2 detector and positive ETCO2

## 2023-08-27 NOTE — Transfer of Care (Signed)
Immediate Anesthesia Transfer of Care Note  Patient: Lori Bean  Procedure(s) Performed: Procedure(s) (LRB): REVISION AND REIMPLANTATION SACRAL NERVE STIMULATOR IMPLANTATION DEVICE (N/A)  Patient Location: PACU  Anesthesia Type: MAC  Level of Consciousness: awake, sedated, patient cooperative and responds to stimulation  Airway & Oxygen Therapy: Patient Spontanous Breathing and Patient connected to [place] oxygen  Post-op Assessment: Report given to PACU RN, Post -op Vital signs reviewed and stable and Patient moving all extremities  Post vital signs: Reviewed and stable  Complications: No apparent anesthesia complications

## 2023-08-31 ENCOUNTER — Other Ambulatory Visit (HOSPITAL_COMMUNITY): Payer: Self-pay

## 2023-08-31 MED ORDER — NALOXONE HCL 4 MG/0.1ML NA LIQD
1.0000 | NASAL | 11 refills | Status: DC | PRN
  Filled 2023-08-31: qty 2, 4d supply, fill #0

## 2023-08-31 MED ORDER — OXYCODONE HCL 10 MG PO TABS
10.0000 mg | ORAL_TABLET | Freq: Every day | ORAL | 0 refills | Status: DC
  Filled 2023-08-31: qty 180, 30d supply, fill #0

## 2023-09-16 NOTE — Progress Notes (Signed)
 Cardiology Office Note:  .   Date:  09/27/2023  ID:  Lori Bean, DOB [birth year], MRN [id number] PCP: Tobie Emmy POUR, NP   [facility] Providers Cardiologist:  Annabella Scarce, MD Cardiology APP:  Vannie Reche RAMAN, NP    Patient Profile: .      PMH Chronic HFpEF Asthma OSA on CPAP Obesity Prior PE Lymphedema Schizophrenia Bipolar disorder  Established with cardiology during admission 03/2021 with acute hyper cardiac respiratory failure requiring intubation.  She had elevated troponins.  Cardiac CT was less than ideal given body habitus though coronary calcium  score of 0.  Troponin was deemed demand ischemia and no further ischemia evaluation warranted.  TTE 03/29/2021 revealed LVEF 60 to 65%, no RWMA, normal diastolic parameters, no significant valve disease.  Her home antihypertensive regimen was resumed.  Seen 06/05/2021 for hospital follow-up.  She was referred to prep exercise program and recommended to follow-up with cardiology in 3 to 4 months.  She was not seen until 06/26/2022 at which time she was doing well but was noted to be out of her triamterene /hydrochlorothiazide  and atorvastatin .  These were resumed.  Admission 12/13-12/15/23 treated for COPD exacerbation.  She was provided IV Lasix  for lower extremity edema.  She underwent sacral nerve stimulator implantation 09/25/2022.  Seen 10/02/2022 by [name], NP.  She reported atypical right-sided chest pain, tender on palpation for which she was taking ibuprofen  with some relief.  Exertional dyspnea overall unchanged.  No orthopnea or PND.  LE edema felt to be worse over the previous few weeks.  She was wrapping her legs nightly.  PCP started her on Lasix  20 mg daily but she did not note much of a response. Was having trouble getting her Symbicort  due to issues with the Social Security office. Was advised to increase Lasix  to 40 mg twice daily for 2 days then 40 mg daily.  BNP on that day was normal and renal  function was stable. She was offered assistance by the social work team for help with getting medications and was advised to return in 6 weeks for follow-up.   Seen by me on 06/22/23 for preoperative cardiac evaluation for revision and reimplantation of sacral nerve stimulator implantation device. Discussed the use of AI scribe software for clinical note transcription with the patient, who gave verbal consent to proceed. Reported persistent fluid retention despite increasing Lasix  to twice daily. Has variable urine output and feels bloated. She experiences shortness of breath with exertion and cannot perform activities such as dressing, walking two city blocks, or mopping two rooms without stopping. She uses four pillows for sleeping due to shortness of breath and a sensation of drowning without her CPAP/BiPAP mask. Occasionally experiences chest pain at different times of the day and night. Reported balance issues, preventing her from carrying heavy items or doing yard work. Despite these limitations, she is able to maintain sexual activity. Has not experienced significant weight loss on  Ozempic . She has a regular eating pattern, avoiding heavy meals after 8 PM due to nightmares. Her diet consists of cereal and fruit for breakfast, chicken tacos for lunch, and a meat with two vegetables for dinner. She mainly cooks her meats in the oven, air fryer, or crock pot to avoid fried foods due to acid reflux. She drinks more water  than anything else. Metoprolol  was stopped by another provider. BP was elevated and carvedilol  12.5 mg BID was started.  Diovan  80 mg and Lasix  40 mg BID were continued. Overall she did not  appear volume overloaded.  Echocardiogram completed 07/15/2023 revealed LVEF 65 to 70%, indeterminate diastolic parameters, normal RV, aortic valve sclerosis with no evidence of stenosis.  Nuclear stress test completed 07/01/2023 revealed no evidence of ischemia or infarction.       History of Present  Illness: .   Lori Bean is a very pleasant 53 y.o. female who is here today for follow-up. She reports sharp, intermittent chest pain that she rates as an 8/10 described as 'something just sticking me' and is frequent throughout the day. Accompanying symptoms include increased shortness of breath and nausea. She is frequently nauseated. No palpitations, increased edema or PND. Has chronic leg swelling which she feels is improved today and sleeps with five pillows due to discomfort when laying flat. Had a feeling of lightheadedness and nearly passing out the previous day. Has been experiencing increased mucus production, which is yellow in color. Is currently on Lasix  80mg , but does not always have good urine output. BP has been fluctuating, with a recent reading of 197/101. She is not currently engaging in regular exercise but plans to start walking around her cul-de-sac. The patient uses a CPAP machine for sleep apnea and is compliant with her medication regimen. She has been unable to obtain her prescribed Breztri  inhaler. No significant weight loss on Ozempic . Does not practice dietary indiscretion.    ROS: See HPI       Studies Reviewed: SABRA   EKG Interpretation Date/Time:  Monday September 27 2023 08:17:08 EST Ventricular Rate:  89 PR Interval:  180 QRS Duration:  86 QT Interval:  390 QTC Calculation: 474 R Axis:   29  Text Interpretation: Normal sinus rhythm Septal infarct , age undetermined When compared with ECG of 22-Jun-2023 15:09, Inverted T waves have replaced nonspecific T wave abnormality in Inferior leads Nonspecific T wave abnormality, improved in Lateral leads Confirmed by Percy Browning [phone]) on 09/27/2023 8:33:58 AM     Risk Assessment/Calculations:             Physical Exam:   VS:  BP 134/88   Pulse 91   Ht 5' 2 (1.575 m)   Wt 248 lb (112.5 kg)   LMP 10/15/2018   SpO2 95%   BMI 45.36 kg/m    Wt Readings from Last 3 Encounters:  09/27/23 248 lb (112.5  kg)  08/27/23 246 lb 6.4 oz (111.8 kg)  07/13/23 249 lb (112.9 kg)    GEN: Obese, well developed in no acute distress NECK: No JVD; No carotid bruits CARDIAC: RRR, no murmurs, rubs, gallops RESPIRATORY:  Clear to auscultation without rales, wheezing or rhonchi  ABDOMEN: Soft, non-tender, non-distended EXTREMITIES:  No edema; No deformity     ASSESSMENT AND PLAN: .    Chest pain: Frequent, sharp, and brief episodes of chest pain rated as 8/10. Associated with increased shortness of breath and nausea. She is also having productive cough with yellow phlegm. Nonspecific TW abnormality on ECG today. Symptoms somewhat atypical for angina. Prior cardiac workup including stress test, CT, and echocardiogram were unremarkable. She had calcium  score of 0 on CT 03/2021, but test was poor quality and there was a question of soft plaque in mLCx. Low risk stress test 07/05/23. Encouraged increased physical activity and heart healthy diet. Call to report worsening symptoms. Consider repeating CT if symptoms persist.  Continue GDMT for CAD including atorvastatin , carvedilol , valsartan .  Hypertension: She reports fluctuations in BP at home. BP is stable in clinic. Low sodium diet encouraged.  No  medication changes today.  We will recheck renal function to ensure stability.   Chronic HFpEF: History of chronic leg edema and orthopnea. Legs are edematous but soft, she reports less swelling today. Volume status is difficult to assess due to body habitus but no JVD noted. Urine output is variable. She has sharp intermittent chest pain associated with increased SOB, no PND or worsening edema. Echo 07/15/2023 revealed normal LVEF 65-70, no rwma, indeterminate diastolic parameters, normal RV, no significant valve disease. Encouraged low sodium diet, management of COPD, and gradually increasing physical activity. Continue GDMT including valsartan , Lasix  and carvedilol .    Hyperlipidemia LDL goal < 70: LDL 75 on 10/02/22.  LDL  goal < 70 due to history of diabetes.  We will repeat lipid testing today. Continue atorvastatin . Encouraged heart healthy diet avoiding saturated fat, simple carbohydrates, and processed food.   OSA: She reports compliance with BiPAP. Management per pulmonology.   Diabetes: A1C 6.4 on 08/26/22. She reports management of diabetes by PCP but it is unclear as to her last office visit. Recommend heart healthy diet avoiding sugar, simple carbohydrates, processed foods and saturated fat as well as increase physical activity. She plans to start walking for exercise. Management per PCP.       Disposition: 6 months with Dr Raford   Signed, Rosaline Bane, NP-C

## 2023-09-27 ENCOUNTER — Ambulatory Visit: Payer: Medicaid Other | Attending: Nurse Practitioner | Admitting: Nurse Practitioner

## 2023-09-27 ENCOUNTER — Encounter: Payer: Self-pay | Admitting: Nurse Practitioner

## 2023-09-27 VITALS — BP 134/88 | HR 91 | Ht 62.0 in | Wt 248.0 lb

## 2023-09-27 DIAGNOSIS — G4733 Obstructive sleep apnea (adult) (pediatric): Secondary | ICD-10-CM

## 2023-09-27 DIAGNOSIS — E669 Obesity, unspecified: Secondary | ICD-10-CM

## 2023-09-27 DIAGNOSIS — E785 Hyperlipidemia, unspecified: Secondary | ICD-10-CM | POA: Diagnosis not present

## 2023-09-27 DIAGNOSIS — E1169 Type 2 diabetes mellitus with other specified complication: Secondary | ICD-10-CM | POA: Diagnosis not present

## 2023-09-27 DIAGNOSIS — I10 Essential (primary) hypertension: Secondary | ICD-10-CM | POA: Diagnosis not present

## 2023-09-27 DIAGNOSIS — I5032 Chronic diastolic (congestive) heart failure: Secondary | ICD-10-CM | POA: Diagnosis not present

## 2023-09-27 DIAGNOSIS — R072 Precordial pain: Secondary | ICD-10-CM

## 2023-09-27 LAB — COMPREHENSIVE METABOLIC PANEL
ALT: 17 [IU]/L (ref 0–32)
AST: 6 [IU]/L (ref 0–40)
Albumin: 4 g/dL (ref 3.8–4.9)
Alkaline Phosphatase: 82 [IU]/L (ref 44–121)
BUN/Creatinine Ratio: 24 — ABNORMAL HIGH (ref 9–23)
BUN: 17 mg/dL (ref 6–24)
Bilirubin Total: <0.2 mg/dL (ref 0.0–1.2)
CO2: 23 mmol/L (ref 20–29)
Calcium: 9.6 mg/dL (ref 8.7–10.2)
Chloride: 103 mmol/L (ref 96–106)
Creatinine, Ser: 0.7 mg/dL (ref 0.57–1.00)
Globulin, Total: 2.5 g/dL (ref 1.5–4.5)
Glucose: 103 mg/dL — ABNORMAL HIGH (ref 70–99)
Potassium: 4.6 mmol/L (ref 3.5–5.2)
Sodium: 140 mmol/L (ref 134–144)
Total Protein: 6.5 g/dL (ref 6.0–8.5)
eGFR: 103 mL/min/{1.73_m2} (ref >59)

## 2023-09-27 LAB — LIPID PANEL
Chol/HDL Ratio: 2.5 {ratio} (ref 0.0–4.4)
Cholesterol, Total: 144 mg/dL (ref 100–199)
HDL: 57 mg/dL (ref >39)
LDL Chol Calc (NIH): 71 mg/dL (ref 0–99)
Triglycerides: 87 mg/dL (ref 0–149)
VLDL Cholesterol Cal: 16 mg/dL (ref 5–40)

## 2023-09-27 NOTE — Patient Instructions (Signed)
 Medication Instructions:   Your physician recommends that you continue on your current medications as directed. Please refer to the Current Medication list given to you today.   *If you need a refill on your cardiac medications before your next appointment, please call your pharmacy*   Lab Work:  TODAY!!!! CMET/LIPID  If you have labs (blood work) drawn today and your tests are completely normal, you will receive your results only by: MyChart Message (if you have MyChart) OR A paper copy in the mail If you have any lab test that is abnormal or we need to change your treatment, we will call you to review the results.   Testing/Procedures:  None ordered.   Follow-Up: At [facility], you and your health needs are our priority.  As part of our continuing mission to provide you with exceptional heart care, we have created designated Provider Care Teams.  These Care Teams include your primary Cardiologist (physician) and Advanced Practice Providers (APPs -  Physician Assistants and Nurse Practitioners) who all work together to provide you with the care you need, when you need it.  We recommend signing up for the patient portal called MyChart.  Sign up information is provided on this After Visit Summary.  MyChart is used to connect with patients for Virtual Visits (Telemedicine).  Patients are able to view lab/test results, encounter notes, upcoming appointments, etc.  Non-urgent messages can be sent to your provider as well.   To learn more about what you can do with MyChart, go to [email or web address].    Your next appointment:   5 month(s)  Provider:   Annabella Scarce, MD    Other Instructions   1st Floor: - Lobby - Registration  - Pharmacy  - Lab - Cafe  2nd Floor: - PV Lab - Diagnostic Testing (echo, CT, nuclear med)  3rd Floor: - Vacant  4th Floor: - TCTS (cardiothoracic surgery) - AFib Clinic - Structural Heart Clinic - Vascular Surgery  -  Vascular Ultrasound  5th Floor: - [facility]Care Cardiology (general and EP) - Clinical Pharmacy for coumadin, hypertension, lipid, weight-loss medications, and med management appointments    Valet parking services will be available as well.

## 2023-10-01 ENCOUNTER — Other Ambulatory Visit (HOSPITAL_COMMUNITY): Payer: Self-pay

## 2023-10-01 MED ORDER — OXYCODONE HCL 10 MG PO TABS
10.0000 mg | ORAL_TABLET | Freq: Every day | ORAL | 0 refills | Status: DC
  Filled 2023-10-01: qty 180, 30d supply, fill #0

## 2023-10-19 ENCOUNTER — Other Ambulatory Visit (HOSPITAL_COMMUNITY): Payer: Self-pay

## 2023-10-19 ENCOUNTER — Other Ambulatory Visit: Payer: Self-pay

## 2023-10-19 MED ORDER — OZEMPIC (1 MG/DOSE) 4 MG/3ML ~~LOC~~ SOPN
1.0000 mg | PEN_INJECTOR | SUBCUTANEOUS | 2 refills | Status: AC
  Filled 2023-10-19: qty 3, 28d supply, fill #0
  Filled 2023-11-12: qty 3, 28d supply, fill #1
  Filled 2023-12-10: qty 3, 28d supply, fill #2

## 2023-10-19 MED ORDER — BUDESONIDE-FORMOTEROL FUMARATE 160-4.5 MCG/ACT IN AERO
1.0000 | INHALATION_SPRAY | Freq: Two times a day (BID) | RESPIRATORY_TRACT | 3 refills | Status: AC
  Filled 2023-10-19: qty 10.2, 60d supply, fill #0

## 2023-10-20 ENCOUNTER — Other Ambulatory Visit (HOSPITAL_COMMUNITY): Payer: Self-pay

## 2023-10-29 ENCOUNTER — Other Ambulatory Visit (HOSPITAL_COMMUNITY): Payer: Self-pay

## 2023-10-29 MED ORDER — OXYCODONE HCL 10 MG PO TABS
10.0000 mg | ORAL_TABLET | Freq: Every day | ORAL | 0 refills | Status: DC
  Filled 2023-10-29: qty 30, 5d supply, fill #0
  Filled 2023-11-01 – 2023-11-02 (×2): qty 150, 25d supply, fill #1
  Filled 2023-11-02: qty 150, 30d supply, fill #1

## 2023-10-29 MED ORDER — ERGOCALCIFEROL 1.25 MG (50000 UT) PO CAPS
50000.0000 [IU] | ORAL_CAPSULE | ORAL | 1 refills | Status: DC
  Filled 2023-10-29: qty 12, 84d supply, fill #0

## 2023-11-01 ENCOUNTER — Other Ambulatory Visit (HOSPITAL_COMMUNITY): Payer: Self-pay

## 2023-11-02 ENCOUNTER — Other Ambulatory Visit (HOSPITAL_COMMUNITY): Payer: Self-pay

## 2023-11-12 ENCOUNTER — Other Ambulatory Visit (HOSPITAL_COMMUNITY): Payer: Self-pay

## 2023-11-26 ENCOUNTER — Other Ambulatory Visit (HOSPITAL_COMMUNITY): Payer: Self-pay

## 2023-11-26 MED ORDER — OXYCODONE HCL 10 MG PO TABS
10.0000 mg | ORAL_TABLET | Freq: Every day | ORAL | 0 refills | Status: DC
  Filled 2023-11-30: qty 180, 30d supply, fill #0

## 2023-11-30 ENCOUNTER — Other Ambulatory Visit (HOSPITAL_COMMUNITY): Payer: Self-pay

## 2023-12-09 ENCOUNTER — Other Ambulatory Visit (HOSPITAL_BASED_OUTPATIENT_CLINIC_OR_DEPARTMENT_OTHER): Payer: Self-pay | Admitting: Cardiovascular Disease

## 2023-12-09 DIAGNOSIS — I10 Essential (primary) hypertension: Secondary | ICD-10-CM

## 2023-12-10 ENCOUNTER — Other Ambulatory Visit (HOSPITAL_COMMUNITY): Payer: Self-pay

## 2023-12-14 ENCOUNTER — Other Ambulatory Visit (HOSPITAL_COMMUNITY): Payer: Self-pay

## 2023-12-24 ENCOUNTER — Other Ambulatory Visit (HOSPITAL_COMMUNITY): Payer: Self-pay

## 2023-12-24 MED ORDER — METHOCARBAMOL 500 MG PO TABS
500.0000 mg | ORAL_TABLET | Freq: Three times a day (TID) | ORAL | 1 refills | Status: DC | PRN
  Filled 2023-12-24: qty 90, 30d supply, fill #0

## 2023-12-24 MED ORDER — OXYCODONE HCL 10 MG PO TABS
10.0000 mg | ORAL_TABLET | Freq: Every day | ORAL | 0 refills | Status: DC
  Filled 2023-12-28: qty 180, 30d supply, fill #0

## 2023-12-28 ENCOUNTER — Other Ambulatory Visit: Payer: Self-pay

## 2023-12-28 ENCOUNTER — Other Ambulatory Visit (HOSPITAL_COMMUNITY): Payer: Self-pay

## 2024-01-10 ENCOUNTER — Other Ambulatory Visit (HOSPITAL_COMMUNITY): Payer: Self-pay

## 2024-01-18 ENCOUNTER — Other Ambulatory Visit (HOSPITAL_COMMUNITY): Payer: Self-pay

## 2024-01-21 ENCOUNTER — Other Ambulatory Visit (HOSPITAL_COMMUNITY): Payer: Self-pay

## 2024-01-21 MED ORDER — NALOXONE HCL 4 MG/0.1ML NA LIQD: 1.0000 | Freq: Once | NASAL | 11 refills | Status: AC

## 2024-01-21 MED ORDER — OXYCODONE HCL 10 MG PO TABS
10.0000 mg | ORAL_TABLET | Freq: Every day | ORAL | 0 refills | Status: DC
  Filled 2024-01-25 (×2): qty 180, 30d supply, fill #0

## 2024-01-24 ENCOUNTER — Other Ambulatory Visit (HOSPITAL_COMMUNITY): Payer: Self-pay

## 2024-01-24 ENCOUNTER — Encounter (HOSPITAL_COMMUNITY): Payer: Self-pay

## 2024-01-25 ENCOUNTER — Other Ambulatory Visit (HOSPITAL_COMMUNITY): Payer: Self-pay

## 2024-01-31 ENCOUNTER — Other Ambulatory Visit (HOSPITAL_BASED_OUTPATIENT_CLINIC_OR_DEPARTMENT_OTHER): Payer: Self-pay

## 2024-01-31 ENCOUNTER — Ambulatory Visit (HOSPITAL_BASED_OUTPATIENT_CLINIC_OR_DEPARTMENT_OTHER): Payer: Self-pay | Admitting: Pulmonary Disease

## 2024-01-31 MED ORDER — ALBUTEROL SULFATE HFA 108 (90 BASE) MCG/ACT IN AERS
1.0000 | INHALATION_SPRAY | Freq: Four times a day (QID) | RESPIRATORY_TRACT | 6 refills | Status: AC | PRN
Start: 2024-01-31 — End: ?

## 2024-01-31 NOTE — Telephone Encounter (Signed)
 Pt notified rx sent to pharmacy and she should contact [employer] to troubleshoot her BIPAP machine

## 2024-01-31 NOTE — Telephone Encounter (Signed)
 E2C2 Pulmonary Triage - Initial Assessment Questions "Chief Complaint (e.g., cough, sob, wheezing, fever, chills, sweat or additional symptoms) *Go to specific symptom protocol after initial questions. Patient calling with concerns of CPAP machine stopping while she was sleeping. Patient states she was asleep for over three hours when the machine cut off and wouldn't stay on. Patient states she could hear air coming out of the back of the machine-pt states she could see a red light pop up on her machine. Patient endorses periods of shortness of breath, high blood pressure readings, headaches, wheezing and pain in hips and legs. Patient reports BP of 227/111-BP today is 185/95. Reports headaches for the last three days. Patient reports taking her blood pressure medication without any issue. Patient does endorse that she isn't taking her blood pressure regularly. Patient is instructed to follow up with her PCP about blood pressure readings and headaches. Patient is asking for recommendations about her CPAP machine along with a refill for albuterol  inhaler. Patient was also told that she needs a prior authorization completed for Breztri  inhaler. Please follow up with patient.   "How long have symptoms been present?" Headaches started three days ago-  Have you tested for COVID or Flu? Note: If not, ask patient if a home test can be taken. If so, instruct patient to call back for positive results. No  MEDICINES:   "Have you used any OTC meds to help with symptoms?" No If yes, ask "What medications?"   "Have you used your inhalers/maintenance medication?" Yes If yes, "What medications?" Albuterol  nebulizer/inhaler Symbicort    If inhaler, ask "How many puffs and how often?" Note: Review instructions on medication in the chart. Symbicort  1 puff BID Albuterol  inhalers 1-2 puffs q6hr PRN  OXYGEN: "Do you wear supplemental oxygen?" No If yes, "How many liters are you supposed to use?"   "Do you  monitor your oxygen levels?" Yes If yes, "What is your reading (oxygen level) today?" Unsure of what her readings are today as she can't find her pulse oximeter today  "What is your usual oxygen saturation reading?"  (Note: Pulmonary O2 sats should be 90% or greater) 100%   Copied from CRM [id number]. Topic: Clinical - Red Word Triage >> Jan 31, 2024  8:25 AM Ilene Malling wrote: Red Word that prompted transfer to Nurse Triage: Patient [phone] states the cpap machine is not working last night, it would not stay on, kept cutting off. Patient states was having trouble breathing, felt like she was drowing, shortness of breath, blood pressure 227/111 yesterday, terrible headaches, wheezing, pain in head, hip and legs. Patient did not check blood pressure today. Please advise. Reason for Disposition  [1] MILD difficulty breathing (e.g., minimal/no SOB at rest, SOB with walking, pulse <100) AND [2] NEW-onset or WORSE than normal  Answer Assessment - Initial Assessment Questions 1. RESPIRATORY STATUS: "Describe your breathing?" (e.g., wheezing, shortness of breath, unable to speak, severe coughing)      Shortness of breath and wheezing-comes and goes 2. ONSET: "When did this breathing problem begin?"      Shortness of breath and wheezing came on bad with CPAP machine cutting off. Was able to use inhaler without any issue. 3. PATTERN "Does the difficult breathing come and go, or has it been constant since it started?"      Comes and goes 4. SEVERITY: "How bad is your breathing?" (e.g., mild, moderate, severe)    - MILD: No SOB at rest, mild SOB with walking, speaks normally in sentences,  can lie down, no retractions, pulse < 100.    - MODERATE: SOB at rest, SOB with minimal exertion and prefers to sit, cannot lie down flat, speaks in phrases, mild retractions, audible wheezing, pulse 100-120.    - SEVERE: Very SOB at rest, speaks in single words, struggling to breathe, sitting hunched forward,  retractions, pulse > 120      No SOB at this time 5. RECURRENT SYMPTOM: "Have you had difficulty breathing before?" If Yes, ask: "When was the last time?" and "What happened that time?"      no 6. CARDIAC HISTORY: "Do you have any history of heart disease?" (e.g., heart attack, angina, bypass surgery, angioplasty)      High BP, CHF 7. LUNG HISTORY: "Do you have any history of lung disease?"  (e.g., pulmonary embolus, asthma, emphysema)     OSA 8. CAUSE: "What do you think is causing the breathing problem?"      CPAP machine cut off in the middle of sleeping 9. OTHER SYMPTOMS: "Do you have any other symptoms? (e.g., dizziness, runny nose, cough, chest pain, fever)     no  Protocols used: Breathing Difficulty-A-AH

## 2024-02-17 ENCOUNTER — Other Ambulatory Visit: Payer: Self-pay

## 2024-02-17 ENCOUNTER — Other Ambulatory Visit (HOSPITAL_COMMUNITY): Payer: Self-pay

## 2024-02-17 MED ORDER — OXYCODONE HCL 10 MG PO TABS
10.0000 mg | ORAL_TABLET | Freq: Every day | ORAL | 0 refills | Status: DC
  Filled 2024-02-22: qty 180, 30d supply, fill #0

## 2024-02-17 MED ORDER — METHOCARBAMOL 500 MG PO TABS
500.0000 mg | ORAL_TABLET | Freq: Three times a day (TID) | ORAL | 1 refills | Status: AC | PRN
  Filled 2024-02-17: qty 90, 30d supply, fill #0

## 2024-02-17 MED ORDER — ERGOCALCIFEROL 1.25 MG (50000 UT) PO CAPS
1.0000 | ORAL_CAPSULE | ORAL | 1 refills | Status: DC
  Filled 2024-02-17: qty 12, 84d supply, fill #0

## 2024-02-22 ENCOUNTER — Other Ambulatory Visit (HOSPITAL_COMMUNITY): Payer: Self-pay

## 2024-02-22 ENCOUNTER — Other Ambulatory Visit: Payer: Self-pay

## 2024-03-02 ENCOUNTER — Other Ambulatory Visit (HOSPITAL_COMMUNITY): Payer: Self-pay

## 2024-03-03 ENCOUNTER — Other Ambulatory Visit (HOSPITAL_COMMUNITY): Payer: Self-pay

## 2024-03-03 ENCOUNTER — Encounter (HOSPITAL_COMMUNITY): Payer: Self-pay

## 2024-03-08 ENCOUNTER — Encounter (HOSPITAL_BASED_OUTPATIENT_CLINIC_OR_DEPARTMENT_OTHER): Payer: Self-pay | Admitting: *Deleted

## 2024-03-10 ENCOUNTER — Encounter (HOSPITAL_BASED_OUTPATIENT_CLINIC_OR_DEPARTMENT_OTHER): Payer: Self-pay | Admitting: Cardiovascular Disease

## 2024-03-10 ENCOUNTER — Ambulatory Visit (HOSPITAL_BASED_OUTPATIENT_CLINIC_OR_DEPARTMENT_OTHER): Payer: Medicaid Other | Admitting: Cardiovascular Disease

## 2024-03-10 VITALS — BP 118/78 | HR 57 | Ht 62.0 in | Wt 226.2 lb

## 2024-03-10 DIAGNOSIS — G4733 Obstructive sleep apnea (adult) (pediatric): Secondary | ICD-10-CM | POA: Diagnosis not present

## 2024-03-10 DIAGNOSIS — I10 Essential (primary) hypertension: Secondary | ICD-10-CM

## 2024-03-10 DIAGNOSIS — I5032 Chronic diastolic (congestive) heart failure: Secondary | ICD-10-CM

## 2024-03-10 NOTE — Progress Notes (Signed)
 Cardiology Office Note:  .   Date:  03/10/2024  ID:  [name], DOB Dec 13, 1970, MRN [id number] PCP: Sebastian Jerilyn HERO, FNP  [facility] Providers Cardiologist:  Annabella Scarce, MD Cardiology APP:  Vannie Reche RAMAN, NP    History of Present Illness: .   Lori Bean is a 53 y.o. female with HFpEF, hypertensive heart disease, OSA on CPAP/BiPAP, lymphedema, schizophrenia, bipolar disorder, tobacco abuse, diabetes, obesity and prior PE who presents for follow up.  [name] was initially seen 05/2015 with fatigue and shortness of breath.  At the time she endorsed heart failure symptoms, including lower extremity edema, orthopnea and shortness of breath.  She also reported atypical chest pain.  After that appointment she was referred to the hospital and had a Lexiscan  Myoview  05/26/15 that was negative for ischemia and revealed LVEF 57%.  She had an echo 05/24/15 with LVEF 60-65% and grade 2 diastolic dysfunction.     [name] was admitted 03/2021 with hypercarbic respiratory failure requiring intubation.  Cardiac enzymes were elevated.  This was thought to be due to acute demand ischemia.  Systolic function was normal at the time.  Given her calcium  score 0 further ischemic evaluation was not recommended at the time.  She was admitted 08/2022 with a COPD exacerbation and acute on chronic HFpEF.  She required diuresis with IV Lasix .  She followed up with [name], NP 09/2018 for reported right-sided chest pain with chest wall tenderness to palpation.  She is [name], NP 09/2023 and continue to report right-sided chest pain.  Blood pressure was controlled in the office but she noted that it fluctuated at home.  Discussed the use of AI scribe software for clinical note transcription with the patient, who gave verbal consent to proceed.  History of Present Illness [name] experiences heart palpitations described as 'fluttering,' occurring both  during exercise and randomly. These episodes are sometimes accompanied by lightheadedness but do not lead to syncope. The palpitations are brief as she sits down when they occur.   She is on carvedilol , valsartan , and amlodipine  for blood pressure management, and atorvastatin  for cholesterol. Her home blood pressure readings can be high, reaching 157/101, although today's reading was 118/78. She checks her blood pressure at home when experiencing severe headaches.  She uses a CPAP machine regularly for sleep apnea and elevates her head to prevent a burning sensation when lying flat. No recent episodes of increased swelling or orthopnea.  She engages in regular physical activity, walking three times a day for about ten minutes each session. She experiences tiredness and sweating during these walks, especially in humid conditions, and is cautious about falling due to the absence of her daughter or neighbors during her walks.  She monitors her diet, particularly her carbohydrate intake, and has lost 20 pounds over the last five months. Her cholesterol levels were good in April, with an LDL of 72 and an A1c of 6.   ROS:  As per HPI  Studies Reviewed: .       Echo 07/15/23: 1. Left ventricular ejection fraction, by estimation, is 65 to 70%. The  left ventricle has normal function. The left ventricle has no regional  wall motion abnormalities. Left ventricular diastolic parameters are  indeterminate.   2. Right ventricular systolic function is normal. The right ventricular  size is normal.   3. The mitral valve is normal in structure. Trivial mitral valve  regurgitation.   4. The aortic valve is  tricuspid. Aortic valve regurgitation is not  visualized. Aortic valve sclerosis is present, with no evidence of aortic  valve stenosis.   5. The inferior vena cava is normal in size with greater than 50%  respiratory variability, suggesting right atrial pressure of 3 mmHg.   Risk  Assessment/Calculations:            Physical Exam:   VS:  BP 118/78 (BP Location: Right Arm, Patient Position: Sitting, Cuff Size: Large)   Pulse (!) 57   Ht 5' 2 (1.575 m)   Wt 226 lb 3.2 oz (102.6 kg)   LMP 10/15/2018   BMI 41.37 kg/m  , BMI Body mass index is 41.37 kg/m. GENERAL:  Well appearing HEENT: Pupils equal round and reactive, fundi not visualized, oral mucosa unremarkable NECK:  No jugular venous distention, waveform within normal limits, carotid upstroke brisk and symmetric, no bruits, no thyromegaly LUNGS:  Clear to auscultation bilaterally HEART:  RRR.  PMI not displaced or sustained,S1 and S2 within normal limits, no S3, no S4, no clicks, no rubs, no murmurs ABD:  Flat, positive bowel sounds normal in frequency in pitch, no bruits, no rebound, no guarding, no midline pulsatile mass, no hepatomegaly, no splenomegaly EXT:  2 plus pulses throughout, no edema, no cyanosis no clubbing SKIN:  No rashes no nodules NEURO:  Cranial nerves II through XII grossly intact, motor grossly intact throughout PSYCH:  Cognitively intact, oriented to person place and time   ASSESSMENT AND PLAN: .    Assessment & Plan # Palpitations Intermittent palpitations with exercise and randomly, associated with lightheadedness, no syncope. Symptoms infrequent, not prolonged.  If they persist or worsen we will get an ambulatory monitor.  Previous myocardial infarction in 2022 due to demand ischemia.  She has normal coronary arteries.   - Advise reporting if palpitations increase in frequency or duration. - Consider Holter monitor if symptoms worsen.  # Hypertension Blood pressure well-controlled at 118/78 mmHg in office. Occasional high readings at home, up to 157/101 mmHg. On carvedilol , valsartan , and amlodipine . Uncertainty about home monitor accuracy. - Check blood pressure twice daily and record readings. - Verify home blood pressure monitor accuracy at primary care appointment. - Continue  current antihypertensive medications.  # Obstructive Sleep Apnea Obstructive sleep apnea managed with CPAP. Good adherence to therapy, elevates head to prevent discomfort when supine. - Continue CPAP therapy. - Maintain head elevation during sleep.  # Hyperlipidemia:  Lipids are well-controlled on atorvastatin .  Encouraged continued exercise.         Dispo: f/u 1 year  Signed, Annabella Scarce, MD

## 2024-03-10 NOTE — Patient Instructions (Signed)
 Medication Instructions:   Your physician recommends that you continue on your current medications as directed. Please refer to the Current Medication list given to you today.   *If you need a refill on your cardiac medications before your next appointment, please call your pharmacy*  Lab Work:  None ordered.  If you have labs (blood work) drawn today and your tests are completely normal, you will receive your results only by: MyChart Message (if you have MyChart) OR A paper copy in the mail If you have any lab test that is abnormal or we need to change your treatment, we will call you to review the results.  Testing/Procedures:  None ordered.  Follow-Up: At [facility], you and your health needs are our priority.  As part of our continuing mission to provide you with exceptional heart care, our providers are all part of one team.  This team includes your primary Cardiologist (physician) and Advanced Practice Providers or APPs (Physician Assistants and Nurse Practitioners) who all work together to provide you with the care you need, when you need it.  Your next appointment:   1 year(s)  Provider:   Annabella Scarce, MD    We recommend signing up for the patient portal called MyChart.  Sign up information is provided on this After Visit Summary.  MyChart is used to connect with patients for Virtual Visits (Telemedicine).  Patients are able to view lab/test results, encounter notes, upcoming appointments, etc.  Non-urgent messages can be sent to your provider as well.   To learn more about what you can do with MyChart, go to [email or web address].   Other Instructions  Your physician wants you to follow-up in: 1 year.  You will receive a reminder letter in the mail two months in advance. If you don't receive a letter, please call our office to schedule the follow-up appointment.  Please bring your BP cuff and readings to your next PCP appointment in  two weeks.  Please  keep track of your BP readings X 2 daily.

## 2024-03-20 ENCOUNTER — Other Ambulatory Visit (HOSPITAL_COMMUNITY): Payer: Self-pay

## 2024-03-20 MED ORDER — OXYCODONE HCL 10 MG PO TABS
10.0000 mg | ORAL_TABLET | Freq: Every day | ORAL | 0 refills | Status: DC
  Filled 2024-03-20 – 2024-03-23 (×2): qty 180, 30d supply, fill #0

## 2024-03-22 ENCOUNTER — Other Ambulatory Visit (HOSPITAL_COMMUNITY): Payer: Self-pay

## 2024-03-23 ENCOUNTER — Other Ambulatory Visit (HOSPITAL_COMMUNITY): Payer: Self-pay

## 2024-04-06 NOTE — Procedures (Signed)
 The patient arrived to the [facility] with mask leak concerns. The patient stated that she is wearing a nasal mask, which does not seal well. The patient was fitted with a [employer] Evora S/M FFM due to oral venting. The patient tolerated the trial well with no noted complications. The patient stated that the trial mask felt much better than her current interface.  -VB

## 2024-04-14 ENCOUNTER — Other Ambulatory Visit: Payer: Self-pay | Admitting: Family Medicine

## 2024-04-14 ENCOUNTER — Other Ambulatory Visit (HOSPITAL_COMMUNITY)
Admission: RE | Admit: 2024-04-14 | Discharge: 2024-04-14 | Disposition: A | Discharge status: Home / Self Care | Source: Ambulatory Visit | Attending: Family Medicine | Admitting: Family Medicine

## 2024-04-14 DIAGNOSIS — Z113 Encounter for screening for infections with a predominantly sexual mode of transmission: Secondary | ICD-10-CM | POA: Insufficient documentation

## 2024-04-14 DIAGNOSIS — F17200 Nicotine dependence, unspecified, uncomplicated: Secondary | ICD-10-CM

## 2024-04-14 DIAGNOSIS — Z124 Encounter for screening for malignant neoplasm of cervix: Secondary | ICD-10-CM | POA: Insufficient documentation

## 2024-04-17 ENCOUNTER — Other Ambulatory Visit (HOSPITAL_COMMUNITY): Payer: Self-pay

## 2024-04-17 MED ORDER — NALOXONE HCL 4 MG/0.1ML NA LIQD
NASAL | 11 refills | Status: AC
  Filled 2024-04-17: qty 1, 30d supply, fill #0

## 2024-04-17 MED ORDER — OXYCODONE HCL 10 MG PO TABS
10.0000 mg | ORAL_TABLET | Freq: Every day | ORAL | 0 refills | Status: DC
  Filled 2024-04-21: qty 180, 30d supply, fill #0
  Filled ????-??-??: fill #0

## 2024-04-18 LAB — MOLECULAR ANCILLARY ONLY
Chlamydia: NEGATIVE
Comment: NEGATIVE
Comment: NEGATIVE
Comment: NORMAL
Neisseria Gonorrhea: NEGATIVE
Trichomonas: POSITIVE — AB

## 2024-04-19 LAB — CYTOLOGY - PAP
Adequacy: ABSENT
Diagnosis: NEGATIVE

## 2024-04-21 ENCOUNTER — Other Ambulatory Visit (HOSPITAL_COMMUNITY): Payer: Self-pay

## 2024-04-21 ENCOUNTER — Encounter: Payer: Self-pay | Admitting: Family Medicine

## 2024-04-28 ENCOUNTER — Ambulatory Visit
Admission: RE | Admit: 2024-04-28 | Discharge: 2024-04-28 | Disposition: A | Discharge status: Home / Self Care | Source: Ambulatory Visit | Attending: Family Medicine | Admitting: Family Medicine

## 2024-04-28 DIAGNOSIS — F17200 Nicotine dependence, unspecified, uncomplicated: Secondary | ICD-10-CM

## 2024-05-01 ENCOUNTER — Ambulatory Visit: Payer: Self-pay | Admitting: Family Medicine

## 2024-05-01 ENCOUNTER — Other Ambulatory Visit: Payer: Self-pay

## 2024-05-01 ENCOUNTER — Emergency Department (HOSPITAL_COMMUNITY)

## 2024-05-01 ENCOUNTER — Emergency Department (HOSPITAL_COMMUNITY)
Admission: EM | Admit: 2024-05-01 | Discharge: 2024-05-01 | Disposition: A | Discharge status: Home / Self Care | Attending: Emergency Medicine | Admitting: Emergency Medicine

## 2024-05-01 ENCOUNTER — Encounter (HOSPITAL_COMMUNITY): Payer: Self-pay

## 2024-05-01 DIAGNOSIS — B349 Viral infection, unspecified: Secondary | ICD-10-CM | POA: Insufficient documentation

## 2024-05-01 DIAGNOSIS — E119 Type 2 diabetes mellitus without complications: Secondary | ICD-10-CM | POA: Diagnosis not present

## 2024-05-01 DIAGNOSIS — I110 Hypertensive heart disease with heart failure: Secondary | ICD-10-CM | POA: Insufficient documentation

## 2024-05-01 DIAGNOSIS — J449 Chronic obstructive pulmonary disease, unspecified: Secondary | ICD-10-CM | POA: Diagnosis not present

## 2024-05-01 DIAGNOSIS — R059 Cough, unspecified: Secondary | ICD-10-CM | POA: Diagnosis present

## 2024-05-01 DIAGNOSIS — I5032 Chronic diastolic (congestive) heart failure: Secondary | ICD-10-CM | POA: Diagnosis not present

## 2024-05-01 DIAGNOSIS — B9789 Other viral agents as the cause of diseases classified elsewhere: Secondary | ICD-10-CM

## 2024-05-01 DIAGNOSIS — D72829 Elevated white blood cell count, unspecified: Secondary | ICD-10-CM | POA: Diagnosis not present

## 2024-05-01 LAB — RESP PANEL BY RT-PCR (RSV, FLU A&B, COVID)  RVPGX2
Influenza A by PCR: NEGATIVE
Influenza B by PCR: NEGATIVE
Resp Syncytial Virus by PCR: NEGATIVE
SARS Coronavirus 2 by RT PCR: NEGATIVE

## 2024-05-01 LAB — CBC WITH DIFFERENTIAL/PLATELET
Abs Immature Granulocytes: 0.04 K/uL (ref 0.00–0.07)
Basophils Absolute: 0.1 K/uL (ref 0.0–0.1)
Basophils Relative: 0 %
Eosinophils Absolute: 0.2 K/uL (ref 0.0–0.5)
Eosinophils Relative: 1 %
HCT: 42.6 % (ref 36.0–46.0)
Hemoglobin: 14.1 g/dL (ref 12.0–15.0)
Immature Granulocytes: 0 %
Lymphocytes Relative: 31 %
Lymphs Abs: 3.6 K/uL (ref 0.7–4.0)
MCH: 31.5 pg (ref 26.0–34.0)
MCHC: 33.1 g/dL (ref 30.0–36.0)
MCV: 95.3 fL (ref 80.0–100.0)
Monocytes Absolute: 1.2 K/uL — ABNORMAL HIGH (ref 0.1–1.0)
Monocytes Relative: 10 %
Neutro Abs: 6.6 K/uL (ref 1.7–7.7)
Neutrophils Relative %: 58 %
Platelets: 213 K/uL (ref 150–400)
RBC: 4.47 MIL/uL (ref 3.87–5.11)
RDW: 13 % (ref 11.5–15.5)
WBC: 11.5 K/uL — ABNORMAL HIGH (ref 4.0–10.5)
nRBC: 0 % (ref 0.0–0.2)

## 2024-05-01 LAB — COMPREHENSIVE METABOLIC PANEL WITH GFR
ALT: 15 U/L (ref 0–44)
AST: 9 U/L — ABNORMAL LOW (ref 15–41)
Albumin: 3.2 g/dL — ABNORMAL LOW (ref 3.5–5.0)
Alkaline Phosphatase: 59 U/L (ref 38–126)
Anion gap: 9 (ref 5–15)
BUN: 11 mg/dL (ref 6–20)
CO2: 25 mmol/L (ref 22–32)
Calcium: 9.2 mg/dL (ref 8.9–10.3)
Chloride: 105 mmol/L (ref 98–111)
Creatinine, Ser: 0.62 mg/dL (ref 0.44–1.00)
GFR, Estimated: >60 mL/min (ref >60)
Glucose, Bld: 97 mg/dL (ref 70–99)
Potassium: 3.7 mmol/L (ref 3.5–5.1)
Sodium: 139 mmol/L (ref 135–145)
Total Bilirubin: 0.6 mg/dL (ref 0.0–1.2)
Total Protein: 6.8 g/dL (ref 6.5–8.1)

## 2024-05-01 LAB — TROPONIN I (HIGH SENSITIVITY)
Troponin I (High Sensitivity): 5 ng/L (ref <18)
Troponin I (High Sensitivity): 6 ng/L (ref <18)

## 2024-05-01 MED ORDER — PREDNISONE 10 MG PO TABS
20.0000 mg | ORAL_TABLET | Freq: Every day | ORAL | 0 refills | Status: AC
Start: 2024-05-01 — End: 2024-05-05

## 2024-05-01 MED ORDER — AZITHROMYCIN 250 MG PO TABS: 250.0000 mg | ORAL_TABLET | Freq: Every day | ORAL | 0 refills | Status: AC

## 2024-05-01 MED ORDER — SODIUM CHLORIDE 0.9 % IV SOLN
500.0000 mg | Freq: Once | INTRAVENOUS | Status: AC
  Administered 2024-05-01: 500 mg via INTRAVENOUS
  Filled 2024-05-01: qty 5

## 2024-05-01 MED ORDER — PREDNISONE 20 MG PO TABS
60.0000 mg | ORAL_TABLET | Freq: Once | ORAL | Status: AC
  Administered 2024-05-01: 60 mg via ORAL
  Filled 2024-05-01: qty 3

## 2024-05-01 NOTE — Telephone Encounter (Signed)
 FYI Only or Action Required?: FYI only for provider.  Patient is followed in Pulmonology for COPD, last seen on 07/13/2023 by Jude Harden GAILS, MD.  Called Nurse Triage reporting Sore Throat.  Symptoms began a week ago.  Triage Disposition: Go to ED Now (Notify PCP), Call EMS 911 Now  Patient/caregiver understands and will follow disposition?: Yes              Copied from CRM [id number]. Topic: Clinical - Red Word Triage >> May 01, 2024 12:37 PM Rilla B wrote: [facility] that prompted transfer to Nurse Triage:  Sore throat, bringing up dark/green mucous, Some diff breathing, chest pain    ----------------------------------------------------------------------- From previous Reason for Contact - Scheduling: Patient/patient representative is calling to schedule an appointment. Refer to attachments for appointment information. Reason for Disposition  [1] Chest pain lasts > 5 minutes AND [2] age > 32  SEVERE PAIN WITH DYSPHAGIA (e.g., can't swallow any liquids, or drooling)  Answer Assessment - Initial Assessment Questions This RN recommends pt goes to ED but pt states she is watching children and will go later today.  ONSET: When did the throat start hurting? (Hours or days ago)      One week  SEVERITY: How bad is the sore throat? (Scale 1-10; mild, moderate or severe)     10/10 pain level  PUS ON THE TONSILS: Is there pus on the tonsils in the back of your throat?     Not able to see it  OTHER SYMPTOMS: Do you have any other symptoms? (e.g., difficulty breathing, headache, rash)     Thick mucous, chest pain since Thurs (middle, 8/10 pain level, intermittent, 5-8 mins), SOB with walking, cough with thick mucus  Not able to check oxygen saturation  Protocols used: Sore Throat-A-AH, Chest Pain-A-AH

## 2024-05-01 NOTE — ED Triage Notes (Signed)
 Pt. Arrived via [facility] from home; c/o SOB and CO; coughing up greenish/brown sputum; concerned about MI due to the same complications from the past which resulted in a MI; 2 nitroglycerin  w pain relief from 8/10 - 5/10; [facility] administered albuterol  which relieved wheezing; 18G in L AC; Pt. Is A/O x4  HR: 83 BP: 148/79 O2: 92%

## 2024-05-01 NOTE — Discharge Instructions (Signed)
 You were seen today for respiratory difficulty and chest pain. While you were here we monitored your vitals, preformed a physical exam, and labs EKG and x-ray. These were all reassuring and there is no indication for any further testing or intervention in the emergency department at this time.  It is likely that your symptoms are being caused by a viral respiratory illness complicated by her COPD.  Please take the antibiotics and prednisone  as prescribed  Things to do:  - Follow up with your primary care provider within the next 1-2 weeks - Take antibiotics and prednisone  as prescribed  Return to the emergency department if you have any new or worsening symptoms including worsening difficulty breathing worsening crushing chest pain, or if you have any other concerns.

## 2024-05-01 NOTE — ED Provider Notes (Signed)
 [place] EMERGENCY DEPARTMENT AT [facility] Provider Note  MDM   HPI/ROS:  Lori Bean is a 53 y.o. female with a medical history as below who presents with sore throat, cough, congestion, substernal chest pain worsened with her cough that is nonexertional nonradiating.  Her symptoms been going on for approximately 8 days.  Chest pain started a few days later she believes due to recurrent cough.  She also endorses some wheezing which is improved after treatments via EMS.  She denies any asymmetric leg swelling but does endorse some brown sputum.  Physical exam is notable for: - Mild expiratory wheeze diffusely.  Nasal congestion, no PTA, uvula midline, no tonsillar exudates, mild pharyngitis, chest pain reproduced with palpation  On my initial evaluation, patient is:  -Vital signs stable. Patient afebrile, hemodynamically stable, and non-toxic appearing. -Additional history obtained from chart   Differentials include ACS, pneumonia, PE, COPD exacerbation, CHF exacerbation, PE, dissection, virus.    Patient is generally well-appearing on exam.  She has no increased work of breathing or supplemental O2 requirement.  She does have some mild end expiratory wheeze bilaterally.  She states her wheezing improved with treatments given by EMS.  Prednisone  azithromycin  given.  Her chest pain is reproducible by palpation and worsened since she started having frequent cough.  High concern for costochondritis likely secondary to infectious exacerbation.  Given this patient's history of believe ACS workup is reasonable and results are as below on reassessment wheezing is improved.  She states she feels somewhat better.  Not having a chest pain now and states its only with coughing at this point.  Given her reassuring workup, improvement with therapies I believe appropriate for discharge with close follow-up with PCP and strict return precautions.  Interpretations, interventions, and the  patient's course of care are documented below.    Clinical Course as of 05/01/24 1814  Mon May 01, 2024  1650 Troponin I (High Sensitivity): 5 [RC]  1652 DG Chest Portable 1 View Atelectasis without pneumonia, pneumothorax or wide mediastinum [RC]  1652 NSR with sinus arrhythmia, normal axis and intervals.  No ST or depression.  Nonischemic EKG [RC]  1653 WBC(!): 11.5 Mild leukocytosis with no left shift likely infectious [RC]  1653 CMP unremarkable [RC]    Clinical Course User Index [RC] Sharyne Darina RAMAN, MD      Disposition:  I discussed the plan for discharge with the patient and/or their surrogate at bedside prior to discharge and they were in agreement with the plan and verbalized understanding of the return precautions provided. All questions answered to the best of my ability. Ultimately, the patient was discharged in stable condition with stable vital signs. I am reassured that they are capable of close follow up and good social support at home.   Clinical Impression:  1. Chronic obstructive pulmonary disease, unspecified COPD type (HCC)   2. Viral respiratory illness     Rx / DC Orders ED Discharge Orders          Ordered    predniSONE  (DELTASONE ) 10 MG tablet  Daily        05/01/24 1809    azithromycin  (ZITHROMAX ) 250 MG tablet  Daily        05/01/24 1809            The plan for this patient was discussed with Dr. Randol, who voiced agreement and who oversaw evaluation and treatment of this patient.   Clinical Complexity A medically appropriate history, review of systems,  and physical exam was performed.  My independent interpretations of EKG, labs, and radiology are documented in the ED course above.   If decision rules were used in this patient's evaluation, they are listed below.   Click here for ABCD2, HEART and other calculatorsREFRESH Note before signing   Patient's presentation is most consistent with acute presentation with potential threat to  life or bodily function.  Medical Decision Making Amount and/or Complexity of Data Reviewed Labs: ordered. Decision-making details documented in ED Course. Radiology: ordered. Decision-making details documented in ED Course.  Risk Prescription drug management.    HPI/ROS      See MDM section for pertinent HPI and ROS. A complete ROS was performed with pertinent positives/negatives noted above.   Past Medical History:  Diagnosis Date   Abdominal hernia    Asthma    Bipolar affective disorder (HCC)    Breast pain in female    09-23-2022  per pt right side due to previous chronic cough which resolved   Chronic diastolic CHF (congestive heart failure) (HCC) 2013   followed by dr t. raford   Chronic pain syndrome    COPD mixed type [facility])    pulmonologist--- dr jude;  pt not on any oxygen;   still smokes;    last exacerbation ED visit 11-23-2022 no admission but followed up w/ Dr jude 12-14-2022 treated;   prior to this admission w/ ARF 08-26-2022   Depression    follows w/ PCP   GAD (generalized anxiety disorder)    GERD (gastroesophageal reflux disease)    Headache    occasional   Heart murmur    Hepatic steatosis    followed by GI w/ [facility] ;    liver bx 05-14-2022 mild active steatohepatitis, fibrosis stage 0-1, early stages primary biliary cholangitis   History of acute respiratory failure    multiple admission for  COPd/ asthma exacerbation's w/ failure (09-22-2022  last admission 08-26-2022)   History of DVT (deep vein thrombosis)    05-02-2012  RUL thrombosis cephalic vein and 05-08-2012  LUE  superficial thrombus bachial vein PICC line site;  01-30-2014   LLE   History of substance abuse (HCC)    crack/ opiates/ cannibus  (08-23-2023  pt stated last used any type 2016)   Hyperlipidemia, mixed    Hypertension    coronary CT 04-01-2021   calcium  score=zero   Incontinence of feces with fecal urgency    s/p  stage one sacrum nerve stimulator  09/ 2023 by dr  debby in office   Lymphedema of both lower extremities 06/07/2018   per pt wraps legs nightly   Morbid (severe) obesity due to excess calories (HCC)    Neuropathy    diabetic neuropathy and left leg post op lumbar surgery   OA (osteoarthritis)    OSA treated with BiPAP    pulmonologist--- dr jude;   per pt uses nightly   Overactive bladder    Schizophrenia (HCC)    Type 2 diabetes mellitus (HCC)    followed by pcp;   (08/23/23 -pt stated she checks BS tid, average fasting blood surgar of 90   Wears glasses     Past Surgical History:  Procedure Laterality Date   CHOLECYSTECTOMY, LAPAROSCOPIC  11/09/2007   @[facility]  by dr ethyl   COLONOSCOPY WITH PROPOFOL  N/A 01/23/2016   Procedure: COLONOSCOPY WITH PROPOFOL ;  Surgeon: Lesta JULIANNA Fitz, MD;  Location: [facility];  Service: Endoscopy;  Laterality: N/A;   KNEE ARTHROSCOPY WITH MENISCAL  REPAIR Right 09/28/2016   Procedure: KNEE ARTHROSCOPY WITH MENISCAL REPAIR;  Surgeon: Norleen Gavel, MD;  Location: [facility] OR;  Service: Orthopedics;  Laterality: Right;  Right partial meniscectomy and chondroplasty, patellar/femoral joint and medial femoral condyle    LUMBAR LAMINECTOMY/DECOMPRESSION MICRODISCECTOMY  04/01/2012   Procedure: LUMBAR LAMINECTOMY/DECOMPRESSION MICRODISCECTOMY 2 LEVELS;  Surgeon: Darina MALVA Boehringer, MD;  Location: [facility];  Service: Neurosurgery;  Laterality: Left;  Lumbar four-five, lumbar five sacral one microdiscectomy    LUMBAR WOUND DEBRIDEMENT  04/29/2012   Procedure: LUMBAR WOUND DEBRIDEMENT;  Surgeon: Darina MALVA Boehringer, MD;  Location: [facility];  Service: Neurosurgery;  Laterality: N/A;  lumbar wound debridement   ORIF ORBITAL FRACTURE Right 1989   per pt has retained hardware   POSTERIOR FUSION LUMBAR SPINE  12/28/2013   @[facility]  by dr boehringer;   bilateral L5-S1 laminectomy / discectomy/ decompression / fusion   ROTATOR CUFF REPAIR Right    yrs ago   SACRAL NERVE STIMULATOR IMPLANTATION  09/25/2022   [facility] -  Dr. Debby   TUBAL LIGATION Bilateral 1999   PPTL      Physical Exam   Vitals:   05/01/24 1440 05/01/24 1442  BP: 120/70   Pulse: 79   Resp: 18   Temp: 98.3 F (36.8 C)   TempSrc: Oral   SpO2: 96%   Weight:  102.6 kg  Height:  5' 2 (1.575 m)    Physical Exam Vitals and nursing note reviewed.  Constitutional:      General: She is not in acute distress.    Appearance: She is well-developed.  HENT:     Head: Normocephalic and atraumatic.  Eyes:     Conjunctiva/sclera: Conjunctivae normal.  Cardiovascular:     Rate and Rhythm: Normal rate and regular rhythm.     Heart sounds: No murmur heard. Pulmonary:     Effort: Pulmonary effort is normal. No tachypnea, prolonged expiration or respiratory distress.     Breath sounds: Wheezing present.  Abdominal:     Palpations: Abdomen is soft.     Tenderness: There is no abdominal tenderness.  Musculoskeletal:        General: No swelling.     Cervical back: Neck supple.  Skin:    General: Skin is warm and dry.     Capillary Refill: Capillary refill takes less than 2 seconds.  Neurological:     Mental Status: She is alert.  Psychiatric:        Mood and Affect: Mood normal.      Procedures   If procedures were preformed on this patient, they are listed below:  Procedures   @BBSIG @   Please note that this documentation was produced with the assistance of voice-to-text technology and may contain errors.    Sharyne Darina RAMAN, MD 05/01/24 RUTA    Randol Simmonds, MD 05/03/24 [phone]

## 2024-05-17 ENCOUNTER — Ambulatory Visit: Payer: Self-pay | Admitting: General Surgery

## 2024-05-17 NOTE — H&P (Signed)
 PROVIDER:  Antonique Langford CHRISTINE Brance Dartt, MD   MRN: [id number] DOB: 1970/12/01 DATE OF ENCOUNTER: 05/17/2024 Interval History:    Patient presented to the office approximately a year after sacral nerve stimulator implantation with concerns of the battery moving in the pouch and decreased function.  She was taken to the operating room on 08/27/2023 and the lead was evaluated.  This appeared to be in good position on AP and lateral views.  The battery was removed and replaced into a deeper pocket with a Tyrex pouch.  All leads were functioning well at the end of the procedure.  She states that it continue to flip in the pocket and she does not want it anymore.  She states that it is not working well despite changes made with the rep.     Past Medical History:  Diagnosis Date   Abdominal hernia    Asthma    Bipolar affective disorder (HCC)    Breast pain in female    09-23-2022  per pt right side due to previous chronic cough which resolved   Chronic diastolic CHF (congestive heart failure) (HCC) 2013   followed by dr t. raford   Chronic pain syndrome    COPD mixed type [facility])    pulmonologist--- dr jude;  pt not on any oxygen;   still smokes;    last exacerbation ED visit 11-23-2022 no admission but followed up w/ Dr jude 12-14-2022 treated;   prior to this admission w/ ARF 08-26-2022   Depression    follows w/ PCP   GAD (generalized anxiety disorder)    GERD (gastroesophageal reflux disease)    Headache    occasional   Heart murmur    Hepatic steatosis    followed by GI w/ [facility] ;    liver bx 05-14-2022 mild active steatohepatitis, fibrosis stage 0-1, early stages primary biliary cholangitis   History of acute respiratory failure    multiple admission for  COPd/ asthma exacerbation's w/ failure (09-22-2022  last admission 08-26-2022)   History of DVT (deep vein thrombosis)    05-02-2012  RUL thrombosis cephalic vein and 05-08-2012  LUE  superficial thrombus bachial vein PICC line  site;  01-30-2014   LLE   History of substance abuse (HCC)    crack/ opiates/ cannibus  (08-23-2023  pt stated last used any type 2016)   Hyperlipidemia, mixed    Hypertension    coronary CT 04-01-2021   calcium  score=zero   Incontinence of feces with fecal urgency    s/p  stage one sacrum nerve stimulator  09/ 2023 by dr debby in office   Lymphedema of both lower extremities 06/07/2018   per pt wraps legs nightly   Morbid (severe) obesity due to excess calories (HCC)    Neuropathy    diabetic neuropathy and left leg post op lumbar surgery   OA (osteoarthritis)    OSA treated with BiPAP    pulmonologist--- dr jude;   per pt uses nightly   Overactive bladder    Schizophrenia (HCC)    Type 2 diabetes mellitus (HCC)    followed by pcp;   (08/23/23 -pt stated she checks BS tid, average fasting blood surgar of 90   Wears glasses     Past Surgical History:  Procedure Laterality Date   CHOLECYSTECTOMY, LAPAROSCOPIC  11/09/2007   @[facility]  by dr ethyl   COLONOSCOPY WITH PROPOFOL  N/A 01/23/2016   Procedure: COLONOSCOPY WITH PROPOFOL ;  Surgeon: Lesta JULIANNA Fitz, MD;  Location: [facility];  Service:  Endoscopy;  Laterality: N/A;   KNEE ARTHROSCOPY WITH MENISCAL REPAIR Right 09/28/2016   Procedure: KNEE ARTHROSCOPY WITH MENISCAL REPAIR;  Surgeon: Norleen Gavel, MD;  Location: [facility];  Service: Orthopedics;  Laterality: Right;  Right partial meniscectomy and chondroplasty, patellar/femoral joint and medial femoral condyle    LUMBAR LAMINECTOMY/DECOMPRESSION MICRODISCECTOMY  04/01/2012   Procedure: LUMBAR LAMINECTOMY/DECOMPRESSION MICRODISCECTOMY 2 LEVELS;  Surgeon: Darina MALVA Boehringer, MD;  Location: [facility];  Service: Neurosurgery;  Laterality: Left;  Lumbar four-five, lumbar five sacral one microdiscectomy    LUMBAR WOUND DEBRIDEMENT  04/29/2012   Procedure: LUMBAR WOUND DEBRIDEMENT;  Surgeon: Darina MALVA Boehringer, MD;  Location: [facility];  Service: Neurosurgery;  Laterality: N/A;  lumbar wound  debridement   ORIF ORBITAL FRACTURE Right 1989   per pt has retained hardware   POSTERIOR FUSION LUMBAR SPINE  12/28/2013   @[facility]  by dr boehringer;   bilateral L5-S1 laminectomy / discectomy/ decompression / fusion   ROTATOR CUFF REPAIR Right    yrs ago   SACRAL NERVE STIMULATOR IMPLANTATION  09/25/2022   [facility] - Dr. Debby   TUBAL LIGATION Bilateral 1999   PPTL    Family History  Problem Relation Age of Onset   Diabetes Mother    Hypertension Mother    Diabetes Father    Heart disease Paternal Aunt    Cancer Paternal Aunt    Esophageal cancer Neg Hx    Stomach cancer Neg Hx     Social History   Socioeconomic History   Marital status: Divorced    Spouse name: Not on file   Number of children: 2   Years of education: 12   Highest education level: Not on file  Occupational History    Comment: disabled  Tobacco Use   Smoking status: Every Day    Current packs/day: 0.50    Average packs/day: 0.5 packs/day for 25.0 years (12.5 ttl pk-yrs)    Types: Cigarettes    Passive exposure: Current   Smokeless tobacco: Never   Tobacco comments:    09-23-2022  per pt trying to quit again ,  has smoked off and on multiple times / 08/23/23 smokes 1/2ppd  Vaping Use   Vaping status: Former   Devices: per pt quit 2016  Substance and Sexual Activity   Alcohol use: Not Currently    Comment: quit Nov. 2014   Drug use: Not Currently    Types: Cocaine, Crack cocaine, Marijuana    Comment: 08/23/23  per pt last use of any type 2016   Sexual activity: Yes    Birth control/protection: Surgical, Post-menopausal  Other Topics Concern   Not on file  Social History Narrative   Lives alone   Caffeine - coffee 1 cup daily   Social Drivers of Corporate investment banker Strain: Not on file  Food Insecurity: No Food Insecurity (08/26/2022)   Hunger Vital Sign    Worried About Running Out of Food in the Last Year: Never true    Ran Out of Food in the Last Year: Never true   Transportation Needs: No Transportation Needs (08/26/2022)   PRAPARE - Administrator, Civil Service (Medical): No    Lack of Transportation (Non-Medical): No  Physical Activity: Not on file  Stress: Not on file  Social Connections: Not on file  Intimate Partner Violence: Not At Risk (08/26/2022)   Humiliation, Afraid, Rape, and Kick questionnaire    Fear of Current or Ex-Partner: No  Emotionally Abused: No    Physically Abused: No    Sexually Abused: No     Current Outpatient Medications:    albuterol  (PROVENTIL ) (2.5 MG/3ML) 0.083% nebulizer solution, Take 2.5 mg by nebulization every 6 (six) hours as needed for wheezing or shortness of breath., Disp: , Rfl:    albuterol  (VENTOLIN  HFA) 108 (90 Base) MCG/ACT inhaler, Inhale 1-2 puffs into the lungs every 6 (six) hours as needed for wheezing or shortness of breath., Disp: 1 each, Rfl: 6   amLODipine  (NORVASC ) 5 MG tablet, Take 5 mg by mouth daily., Disp: , Rfl:    ARIPiprazole  (ABILIFY ) 20 MG tablet, Take 1 tablet (20 mg total) by mouth daily., Disp: 30 tablet, Rfl: 2   atorvastatin  (LIPITOR ) 40 MG tablet, Take 1 tablet (40 mg total) by mouth daily., Disp: 90 tablet, Rfl: 3   Budeson-Glycopyrrol-Formoterol  (BREZTRI  AEROSPHERE) 160-9-4.8 MCG/ACT AERO, Inhale 2 puffs into the lungs in the morning and at bedtime., Disp: 4.8 g, Rfl: 6   budesonide -formoterol  (SYMBICORT ) 160-4.5 MCG/ACT inhaler, Inhale 1 puff into the lungs 2 (two) times daily., Disp: 10.2 g, Rfl: 3   buPROPion (WELLBUTRIN XL) 150 MG 24 hr tablet, Take 150 mg by mouth daily., Disp: , Rfl:    carvedilol  (COREG ) 12.5 MG tablet, Take 1 tablet (12.5 mg total) by mouth 2 (two) times daily., Disp: 180 tablet, Rfl: 3   DULoxetine  (CYMBALTA ) 20 MG capsule, Take 20 mg by mouth 2 (two) times daily., Disp: , Rfl:    ergocalciferol  (VITAMIN D2) 1.25 MG (50000 UT) capsule, Take 1 capsule (50,000 Units total) by mouth once a week., Disp: 12 capsule, Rfl: 1   furosemide   (LASIX ) 40 MG tablet, Take 1 tablet (40 mg total) by mouth daily. Please take 40mg  twice daily x2 days, then 40mg  daily, Disp: 100 tablet, Rfl: 3   gabapentin  (NEURONTIN ) 300 MG capsule, Take 300 mg by mouth 3 (three) times daily., Disp: , Rfl:    glipiZIDE  (GLUCOTROL  XL) 10 MG 24 hr tablet, Take 10 mg by mouth daily with breakfast., Disp: , Rfl:    hydrOXYzine  (ATARAX ) 25 MG tablet, Take 25 mg by mouth as needed., Disp: , Rfl:    metFORMIN  (GLUCOPHAGE ) 1000 MG tablet, Take 1,000 mg by mouth 2 (two) times daily with a meal., Disp: , Rfl:    methocarbamol  (ROBAXIN ) 500 MG tablet, Take 1 tablet (500 mg total) by mouth 3 (three) times daily as needed for muscle spasms., Disp: 90 tablet, Rfl: 1   naloxone  (NARCAN ) nasal spray 4 mg/0.1 mL, Use 1 spray into ONE nostril every 2 minutes as needed for opioid overdose,  alternate nostrils with each dose until help arrives, Disp: 2 each, Rfl: 11   naloxone  (NARCAN ) nasal spray 4 mg/0.1 mL, Use 1 spray every 2 minutes as needed for opioid overdose; spray 1 dose into ONE nostril; alternate nostrils with each dose until help arrives, Disp: 1 each, Rfl: 11   omeprazole  (PRILOSEC) 20 MG capsule, Take 20 mg by mouth daily., Disp: , Rfl:    Oxycodone  HCl 10 MG TABS, Take 1 tablet (10 mg total) by mouth 6 (six) times daily as needed for pain., Disp: 180 tablet, Rfl: 0   Oxycodone  HCl 10 MG TABS, Take 1 tablet (10 mg total) by mouth 6 (six) times daily as needed for pain., Disp: 180 tablet, Rfl: 0   potassium chloride  (KLOR-CON ) 10 MEQ tablet, Take 10 mEq by mouth 2 (two) times daily., Disp: , Rfl:    Semaglutide , 1  MG/DOSE, (OZEMPIC , 1 MG/DOSE,) 4 MG/3ML SOPN, Inject 1 mg into the skin once a week., Disp: 3 mL, Rfl: 2   Triamcinolone  Acetonide 0.025 % LOTN, Apply 1 Application topically 2 (two) times daily as needed., Disp: , Rfl:    ursodiol  (ACTIGALL ) 300 MG capsule, Take 300 mg by mouth 2 (two) times daily., Disp: , Rfl:    valsartan  (DIOVAN ) 80 MG tablet, TAKE 1  TABLET(80 MG) BY MOUTH DAILY, Disp: 90 tablet, Rfl: 1  Allergies  Allergen Reactions   Aspirin  Nausea And Vomiting    GI upset    Review of Systems - Negative except as stated above   Physical Examination:    Gen: NAD   Incision: mobile batter in pouch   Assessment and Plan:    Lori Bean is a 53 y.o. female who underwent replacement of sacral nerve stimulator battery but continues to have trouble with pouch migration.  She is requesting to get it removed.  We discussed that if we remove it, we will not be replacing it and she will have to manage her incotinence with medications.   Diagnoses and all orders for this visit:   Incontinence of feces with fecal urgency

## 2024-05-19 ENCOUNTER — Other Ambulatory Visit (HOSPITAL_COMMUNITY): Payer: Self-pay

## 2024-05-19 MED ORDER — OXYCODONE HCL 10 MG PO TABS
10.0000 mg | ORAL_TABLET | Freq: Every day | ORAL | 0 refills | Status: DC
  Filled 2024-05-19 (×2): qty 90, 15d supply, fill #0

## 2024-06-02 ENCOUNTER — Other Ambulatory Visit (HOSPITAL_COMMUNITY): Payer: Self-pay

## 2024-06-02 MED ORDER — VITAMIN D (ERGOCALCIFEROL) 1.25 MG (50000 UNIT) PO CAPS
50000.0000 [IU] | ORAL_CAPSULE | ORAL | 2 refills | Status: AC
  Filled 2024-06-02: qty 12, 84d supply, fill #0

## 2024-06-02 MED ORDER — OXYCODONE HCL 10 MG PO TABS
10.0000 mg | ORAL_TABLET | Freq: Every day | ORAL | 0 refills | Status: DC
  Filled 2024-06-02: qty 90, 15d supply, fill #0

## 2024-06-06 ENCOUNTER — Emergency Department (HOSPITAL_COMMUNITY)
Admission: EM | Admit: 2024-06-06 | Discharge: 2024-06-06 | Disposition: A | Discharge status: Home / Self Care | Attending: Emergency Medicine | Admitting: Emergency Medicine

## 2024-06-06 ENCOUNTER — Emergency Department (HOSPITAL_COMMUNITY)

## 2024-06-06 ENCOUNTER — Other Ambulatory Visit: Payer: Self-pay

## 2024-06-06 DIAGNOSIS — M545 Low back pain, unspecified: Secondary | ICD-10-CM | POA: Diagnosis not present

## 2024-06-06 DIAGNOSIS — G8929 Other chronic pain: Secondary | ICD-10-CM | POA: Diagnosis not present

## 2024-06-06 DIAGNOSIS — Z7951 Long term (current) use of inhaled steroids: Secondary | ICD-10-CM | POA: Insufficient documentation

## 2024-06-06 DIAGNOSIS — J441 Chronic obstructive pulmonary disease with (acute) exacerbation: Secondary | ICD-10-CM | POA: Insufficient documentation

## 2024-06-06 DIAGNOSIS — Z7984 Long term (current) use of oral hypoglycemic drugs: Secondary | ICD-10-CM | POA: Insufficient documentation

## 2024-06-06 DIAGNOSIS — E1142 Type 2 diabetes mellitus with diabetic polyneuropathy: Secondary | ICD-10-CM | POA: Diagnosis not present

## 2024-06-06 DIAGNOSIS — D72829 Elevated white blood cell count, unspecified: Secondary | ICD-10-CM | POA: Diagnosis not present

## 2024-06-06 DIAGNOSIS — J45909 Unspecified asthma, uncomplicated: Secondary | ICD-10-CM | POA: Diagnosis not present

## 2024-06-06 DIAGNOSIS — J4489 Other specified chronic obstructive pulmonary disease: Secondary | ICD-10-CM | POA: Insufficient documentation

## 2024-06-06 DIAGNOSIS — R059 Cough, unspecified: Secondary | ICD-10-CM | POA: Diagnosis present

## 2024-06-06 DIAGNOSIS — I5032 Chronic diastolic (congestive) heart failure: Secondary | ICD-10-CM | POA: Insufficient documentation

## 2024-06-06 DIAGNOSIS — Z794 Long term (current) use of insulin: Secondary | ICD-10-CM | POA: Insufficient documentation

## 2024-06-06 LAB — CBC WITH DIFFERENTIAL/PLATELET
Abs Immature Granulocytes: 0.05 K/uL (ref 0.00–0.07)
Basophils Absolute: 0 K/uL (ref 0.0–0.1)
Basophils Relative: 0 %
Eosinophils Absolute: 0.1 K/uL (ref 0.0–0.5)
Eosinophils Relative: 1 %
HCT: 38.5 % (ref 36.0–46.0)
Hemoglobin: 12.8 g/dL (ref 12.0–15.0)
Immature Granulocytes: 0 %
Lymphocytes Relative: 22 %
Lymphs Abs: 2.4 K/uL (ref 0.7–4.0)
MCH: 31.8 pg (ref 26.0–34.0)
MCHC: 33.2 g/dL (ref 30.0–36.0)
MCV: 95.8 fL (ref 80.0–100.0)
Monocytes Absolute: 1.2 K/uL — ABNORMAL HIGH (ref 0.1–1.0)
Monocytes Relative: 10 %
Neutro Abs: 7.6 K/uL (ref 1.7–7.7)
Neutrophils Relative %: 67 %
Platelets: 195 K/uL (ref 150–400)
RBC: 4.02 MIL/uL (ref 3.87–5.11)
RDW: 13.4 % (ref 11.5–15.5)
WBC: 11.4 K/uL — ABNORMAL HIGH (ref 4.0–10.5)
nRBC: 0 % (ref 0.0–0.2)

## 2024-06-06 LAB — COMPREHENSIVE METABOLIC PANEL WITH GFR
ALT: 14 U/L (ref 0–44)
AST: <10 U/L — ABNORMAL LOW (ref 15–41)
Albumin: 3 g/dL — ABNORMAL LOW (ref 3.5–5.0)
Alkaline Phosphatase: 67 U/L (ref 38–126)
Anion gap: 9 (ref 5–15)
BUN: 8 mg/dL (ref 6–20)
CO2: 23 mmol/L (ref 22–32)
Calcium: 9 mg/dL (ref 8.9–10.3)
Chloride: 103 mmol/L (ref 98–111)
Creatinine, Ser: 0.56 mg/dL (ref 0.44–1.00)
GFR, Estimated: >60 mL/min (ref >60)
Glucose, Bld: 98 mg/dL (ref 70–99)
Potassium: 4 mmol/L (ref 3.5–5.1)
Sodium: 135 mmol/L (ref 135–145)
Total Bilirubin: 0.6 mg/dL (ref 0.0–1.2)
Total Protein: 6.7 g/dL (ref 6.5–8.1)

## 2024-06-06 LAB — RESP PANEL BY RT-PCR (RSV, FLU A&B, COVID)  RVPGX2
Influenza A by PCR: NEGATIVE
Influenza B by PCR: NEGATIVE
Resp Syncytial Virus by PCR: NEGATIVE
SARS Coronavirus 2 by RT PCR: NEGATIVE

## 2024-06-06 LAB — GROUP A STREP BY PCR: Group A Strep by PCR: NOT DETECTED

## 2024-06-06 LAB — MAGNESIUM: Magnesium: 1.9 mg/dL (ref 1.7–2.4)

## 2024-06-06 LAB — TROPONIN I (HIGH SENSITIVITY): Troponin I (High Sensitivity): 4 ng/L (ref <18)

## 2024-06-06 MED ORDER — AMOXICILLIN-POT CLAVULANATE 875-125 MG PO TABS: 1.0000 | ORAL_TABLET | Freq: Two times a day (BID) | ORAL | 0 refills | Status: DC

## 2024-06-06 MED ORDER — IPRATROPIUM-ALBUTEROL 0.5-2.5 (3) MG/3ML IN SOLN
3.0000 mL | Freq: Once | RESPIRATORY_TRACT | Status: AC
  Administered 2024-06-06: 3 mL via RESPIRATORY_TRACT
  Filled 2024-06-06: qty 3

## 2024-06-06 MED ORDER — LIDOCAINE 5 % EX PTCH: 1.0000 | MEDICATED_PATCH | CUTANEOUS | 0 refills | Status: DC

## 2024-06-06 MED ORDER — FENTANYL CITRATE PF 50 MCG/ML IJ SOSY
50.0000 ug | PREFILLED_SYRINGE | Freq: Once | INTRAMUSCULAR | Status: AC
  Administered 2024-06-06: 50 ug via INTRAVENOUS
  Filled 2024-06-06: qty 1

## 2024-06-06 MED ORDER — LIDOCAINE 5 % EX PTCH
1.0000 | MEDICATED_PATCH | CUTANEOUS | Status: DC
  Administered 2024-06-06: 1 via TRANSDERMAL
  Filled 2024-06-06: qty 1

## 2024-06-06 MED ORDER — ONDANSETRON HCL 4 MG/2ML IJ SOLN
4.0000 mg | Freq: Once | INTRAMUSCULAR | Status: AC
  Administered 2024-06-06: 4 mg via INTRAVENOUS
  Filled 2024-06-06: qty 2

## 2024-06-06 MED ORDER — PREDNISONE 20 MG PO TABS: 40.0000 mg | ORAL_TABLET | Freq: Every day | ORAL | 0 refills | Status: AC

## 2024-06-06 MED ORDER — IOHEXOL 350 MG/ML SOLN
75.0000 mL | Freq: Once | INTRAVENOUS | Status: AC | PRN
  Administered 2024-06-06: 75 mL via INTRAVENOUS

## 2024-06-06 MED ORDER — OXYCODONE-ACETAMINOPHEN 5-325 MG PO TABS: 1.0000 | ORAL_TABLET | ORAL | Status: DC | PRN

## 2024-06-06 NOTE — ED Notes (Signed)
 CCM called for tele admit.

## 2024-06-06 NOTE — Discharge Instructions (Addendum)
 Continue taking your home pain medications for your low back pain.  I have sent in lidocaine  patch as well as prednisone .  Prednisone  is primarily for your COPD exacerbation but it will also help with the back pain.  I also sent in an antibiotic for you.  Follow-up with your primary care doctor in the next 3 days for reevaluation.  Return for any concerning symptoms.  You are able ambulate without drop in your oxygen level and you stated that you did not get real short of breath.  Please do not hesitate to come back for any worsening symptoms.

## 2024-06-06 NOTE — ED Notes (Signed)
 Pt returns from xray

## 2024-06-06 NOTE — ED Notes (Signed)
 Pt transferred to CT.

## 2024-06-06 NOTE — ED Notes (Signed)
 Pt ambulated to restroom. Pt states not feeling any more SHOB than lying in bed. O2 stat 90%on RA. HR 80.

## 2024-06-06 NOTE — ED Notes (Signed)
 Patient transported to CT

## 2024-06-06 NOTE — ED Notes (Signed)
 Pt finished breathing treatment, pt states she feels like it really didn't do anything for her.

## 2024-06-06 NOTE — ED Notes (Signed)
 Pt transported to xray

## 2024-06-06 NOTE — ED Triage Notes (Signed)
 Pt from home w/ c/o back pain, woke her up at 0500. Hx of ruptured disc in back. Pain extends to left side chest. Lungs rhonchi in lower lobes, been around sick grandchildren. Pt says chest pain worse after coughing. Coughing up green mucous. Normally not on O2, but SOB on exertion so wearing 2L. 1 episode vomiting Saturday.

## 2024-06-06 NOTE — ED Provider Notes (Signed)
 [place] EMERGENCY DEPARTMENT AT [facility] Provider Note   CSN: [id number] Arrival date & time: 06/06/24  9146     Patient presents with: Shortness of Breath and Back Pain   Lori Bean is a 53 y.o. female.   53 year old female with past medical history significant for CHF, COPD presents today for concern of low back pain, chest discomfort, shortness of breath, and cough.  Her cough is productive.  At baseline she does not wear supplemental O2.  She states this morning when fire truck arrived on scene she was satting 86% was placed on supplemental O2.  She states she woke up this morning due to low back pain.  She was changing sides while she was sleeping and laid on an area that was tender which caused her to wake up.  She states she does have some pain in her upper back that is radiating into her chest.  The history is provided by the patient. No language interpreter was used.  Back Pain Associated symptoms: chest pain   Associated symptoms: no abdominal pain        Prior to Admission medications   Medication Sig Start Date End Date Taking? Authorizing Provider  albuterol  (PROVENTIL ) (2.5 MG/3ML) 0.083% nebulizer solution Take 2.5 mg by nebulization every 6 (six) hours as needed for wheezing or shortness of breath. 08/12/22   [provider]  albuterol  (VENTOLIN  HFA) 108 (90 Base) MCG/ACT inhaler Inhale 1-2 puffs into the lungs every 6 (six) hours as needed for wheezing or shortness of breath. 01/31/24   Jude Harden GAILS, MD  amLODipine  (NORVASC ) 5 MG tablet Take 5 mg by mouth daily. 12/30/23   [provider]  ARIPiprazole  (ABILIFY ) 20 MG tablet Take 1 tablet (20 mg total) by mouth daily. 06/02/23     atorvastatin  (LIPITOR ) 40 MG tablet Take 1 tablet (40 mg total) by mouth daily. 06/26/22   Walker, Caitlin S, NP  Budeson-Glycopyrrol-Formoterol  (BREZTRI  AEROSPHERE) 160-9-4.8 MCG/ACT AERO Inhale 2 puffs into the lungs in the morning and at bedtime.  07/13/23   Jude Harden GAILS, MD  budesonide -formoterol  (SYMBICORT ) 160-4.5 MCG/ACT inhaler Inhale 1 puff into the lungs 2 (two) times daily. 10/19/23     buPROPion (WELLBUTRIN XL) 150 MG 24 hr tablet Take 150 mg by mouth daily. 11/30/22   [provider]  carvedilol  (COREG ) 12.5 MG tablet Take 1 tablet (12.5 mg total) by mouth 2 (two) times daily. 06/22/23 03/10/24  Swinyer, Rosaline HERO, NP  DULoxetine  (CYMBALTA ) 20 MG capsule Take 20 mg by mouth 2 (two) times daily.    [provider]  ergocalciferol  (VITAMIN D2) 1.25 MG (50000 UT) capsule Take 1 capsule (50,000 Units total) by mouth once a week. 08/03/22   Leron Millman, NP  furosemide  (LASIX ) 40 MG tablet Take 1 tablet (40 mg total) by mouth daily. Please take 40mg  twice daily x2 days, then 40mg  daily 10/05/22 03/10/24  Raford Riggs, MD  gabapentin  (NEURONTIN ) 300 MG capsule Take 300 mg by mouth 3 (three) times daily. 02/02/19   [provider]  glipiZIDE  (GLUCOTROL  XL) 10 MG 24 hr tablet Take 10 mg by mouth daily with breakfast.    [provider]  hydrOXYzine  (ATARAX ) 25 MG tablet Take 25 mg by mouth as needed. 12/09/22   [provider]  metFORMIN  (GLUCOPHAGE ) 1000 MG tablet Take 1,000 mg by mouth 2 (two) times daily with a meal.    [provider]  methocarbamol  (ROBAXIN ) 500 MG tablet Take 1 tablet (500  mg total) by mouth 3 (three) times daily as needed for muscle spasms. 02/17/24     naloxone  (NARCAN ) nasal spray 4 mg/0.1 mL Use 1 spray into ONE nostril every 2 minutes as needed for opioid overdose,  alternate nostrils with each dose until help arrives 08/31/23     naloxone  (NARCAN ) nasal spray 4 mg/0.1 mL Use 1 spray every 2 minutes as needed for opioid overdose; spray 1 dose into ONE nostril; alternate nostrils with each dose until help arrives 04/17/24     omeprazole  (PRILOSEC) 20 MG capsule Take 20 mg by mouth daily. 10/23/23   [provider]  Oxycodone  HCl 10 MG TABS Take 1 tablet (10 mg  total) by mouth 6 (six) times daily as needed for pain. 03/20/24     Oxycodone  HCl 10 MG TABS Take 1 tablet (10 mg total) by mouth 6 (six) times daily for needed for pain. 06/02/24     potassium chloride  (KLOR-CON ) 10 MEQ tablet Take 10 mEq by mouth 2 (two) times daily. 11/25/21   [provider]  Semaglutide , 1 MG/DOSE, (OZEMPIC , 1 MG/DOSE,) 4 MG/3ML SOPN Inject 1 mg into the skin once a week. 10/19/23     Triamcinolone  Acetonide 0.025 % LOTN Apply 1 Application topically 2 (two) times daily as needed.    [provider]  ursodiol  (ACTIGALL ) 300 MG capsule Take 300 mg by mouth 2 (two) times daily. 06/17/22   [provider]  valsartan  (DIOVAN ) 80 MG tablet TAKE 1 TABLET(80 MG) BY MOUTH DAILY 12/09/23   Raford Riggs, MD  Vitamin D , Ergocalciferol , (DRISDOL ) 1.25 MG (50000 UNIT) CAPS capsule Take 1 capsule (50,000 Units total) by mouth once a week. 06/02/24       Allergies: Aspirin     Review of Systems  Respiratory:  Positive for cough, shortness of breath and wheezing.   Cardiovascular:  Positive for chest pain.  Gastrointestinal:  Negative for abdominal pain, nausea and vomiting.  Musculoskeletal:  Positive for back pain.  Neurological:  Negative for light-headedness.    Updated Vital Signs BP (!) 141/77   Pulse 83   Temp 98.9 F (37.2 C) (Oral)   Resp (!) 22   Ht 5' 2 (1.575 m)   Wt 98 kg   LMP 10/15/2018   SpO2 100%   BMI 39.51 kg/m   Physical Exam Vitals and nursing note reviewed.  Constitutional:      General: She is not in acute distress.    Appearance: Normal appearance. She is not ill-appearing.  HENT:     Head: Normocephalic and atraumatic.     Nose: Nose normal.  Eyes:     Conjunctiva/sclera: Conjunctivae normal.  Cardiovascular:     Rate and Rhythm: Normal rate and regular rhythm.  Pulmonary:     Effort: Pulmonary effort is normal. No respiratory distress.     Breath sounds: No wheezing.     Comments: Coarse lung sounds  throughout Musculoskeletal:        General: No deformity. Normal range of motion.     Cervical back: Normal range of motion.  Skin:    Findings: No rash.  Neurological:     Mental Status: She is alert.     (all labs ordered are listed, but only abnormal results are displayed) Labs Reviewed  CBC WITH DIFFERENTIAL/PLATELET - Abnormal; Notable for the following components:      Result Value   WBC 11.4 (*)    Monocytes Absolute 1.2 (*)    All other components within  normal limits  RESP PANEL BY RT-PCR (RSV, FLU A&B, COVID)  RVPGX2  GROUP A STREP BY PCR  COMPREHENSIVE METABOLIC PANEL WITH GFR  MAGNESIUM   TROPONIN I (HIGH SENSITIVITY)    EKG: None  Radiology: DG Chest 2 View Result Date: 06/06/2024 EXAM: 2 VIEW(S) XRAY OF THE CHEST 06/06/2024 09:22:00 AM COMPARISON: 05/01/2024 CLINICAL HISTORY: SOB. Sob,copd FINDINGS: LUNGS AND PLEURA: No focal pulmonary opacity. No pulmonary edema. No pleural effusion. No pneumothorax. HEART AND MEDIASTINUM: No acute abnormality of the cardiac and mediastinal silhouettes. BONES AND SOFT TISSUES: Multilevel degenerative changes of thoracic spine. No acute osseous abnormality. IMPRESSION: 1. No acute cardiopulmonary process. Electronically signed by: Waddell Calk MD 06/06/2024 09:36 AM EDT RP Workstation: [id number]     Procedures   Medications Ordered in the ED  oxyCODONE -acetaminophen  (PERCOCET/ROXICET) 5-325 MG per tablet 1 tablet (has no administration in time range)  fentaNYL  (SUBLIMAZE ) injection 50 mcg (has no administration in time range)  ondansetron  (ZOFRAN ) injection 4 mg (has no administration in time range)  lidocaine  (LIDODERM ) 5 % 1 patch (has no administration in time range)  ipratropium-albuterol  (DUONEB) 0.5-2.5 (3) MG/3ML nebulizer solution 3 mL (has no administration in time range)    Clinical Course as of 06/06/24 1432  Tue Jun 06, 2024  1251 PE study negative for acute PE.  Will ambulate to see if patient desaturates.  [AA]    Clinical Course User Index [AA] Hildegard Loge, PA-C                                 Medical Decision Making Amount and/or Complexity of Data Reviewed Labs: ordered. Radiology: ordered.  Risk Prescription drug management.   Medical Decision Making / ED Course   This patient presents to the ED for concern of shortness of breath, low back pain, chest pain, this involves an extensive number of treatment options, and is a complaint that carries with it a high risk of complications and morbidity.  The differential diagnosis includes ACS, PE, CHF exacerbation, COPD exacerbation, acute on chronic low back pain  MDM: 53 year old female presents today for concern of shortness of breath, chest pain, low back pain which is chronic for her.  Has history of COPD and CHF.  No significant signs of volume overload on exam. She has history of DVT.  Will obtain PE study to rule out PE given her pleuritic chest pain.  Mild leukocytosis but no left shift.  CMP without acute concern.  Magnesium  1.9.  Strep test negative.  Troponin negative.  Respiratory panel negative.  Chest x-ray without acute cardiopulmonary process.  CT chest PE study without evidence of PE.  Chest pain has been going on for more than a day.  It was the low back pain that woke her up this morning because she turned over onto it.  Given chest pain has been ongoing for more than 24 hours and is atypical without any acute ischemic changes on EKG does not feel she need a second troponin and she is eager for discharge stating that her ride is not available after 3:00. Discharged in stable condition. Discussed close follow-up with her PCP within the next 3 days. Discharged in stable condition.  Back pain is likely acute on chronic.  No red flags on exam or history to suggest cauda equina syndrome or spinal epidural abscess.  She is afebrile.  Ambulates without difficulty.  No significant white count.  She likely has a  COPD  exacerbation.  Augmentin , prednisone  prescribed. She will continue taking her home pain medication for her low back pain.  Will add on lidocaine  patch.  Prednisone  should also help with this to an extent.    Lab Tests: -I ordered, reviewed, and interpreted labs.   The pertinent results include:   Labs Reviewed  CBC WITH DIFFERENTIAL/PLATELET - Abnormal; Notable for the following components:      Result Value   WBC 11.4 (*)    Monocytes Absolute 1.2 (*)    All other components within normal limits  COMPREHENSIVE METABOLIC PANEL WITH GFR - Abnormal; Notable for the following components:   Albumin 3.0 (*)    AST <10 (*)    All other components within normal limits  RESP PANEL BY RT-PCR (RSV, FLU A&B, COVID)  RVPGX2  GROUP A STREP BY PCR  MAGNESIUM   TROPONIN I (HIGH SENSITIVITY)  TROPONIN I (HIGH SENSITIVITY)      EKG  EKG Interpretation Date/Time:    Ventricular Rate:    PR Interval:    QRS Duration:    QT Interval:    QTC Calculation:   R Axis:      Text Interpretation:           Imaging Studies ordered: I ordered imaging studies including chest x-ray, CT angio chest PE study I independently visualized and interpreted imaging. I agree with the radiologist interpretation   Medicines ordered and prescription drug management: Meds ordered this encounter  Medications   oxyCODONE -acetaminophen  (PERCOCET/ROXICET) 5-325 MG per tablet 1 tablet    Refill:  0   fentaNYL  (SUBLIMAZE ) injection 50 mcg   ondansetron  (ZOFRAN ) injection 4 mg   lidocaine  (LIDODERM ) 5 % 1 patch   ipratropium-albuterol  (DUONEB) 0.5-2.5 (3) MG/3ML nebulizer solution 3 mL   iohexol  (OMNIPAQUE ) 350 MG/ML injection 75 mL   lidocaine  (LIDODERM ) 5 %    Sig: Place 1 patch onto the skin daily. Remove & Discard patch within 12 hours or as directed by MD    Dispense:  14 patch    Refill:  0    Supervising Provider:   MILLER, BRIAN [[id number]]   predniSONE  (DELTASONE ) 20 MG tablet    Sig: Take 2 tablets  (40 mg total) by mouth daily with breakfast for 5 days.    Dispense:  10 tablet    Refill:  0    Supervising Provider:   MILLER, BRIAN [[id number]]   amoxicillin -clavulanate (AUGMENTIN ) 875-125 MG tablet    Sig: Take 1 tablet by mouth every 12 (twelve) hours.    Dispense:  14 tablet    Refill:  0    Supervising Provider:   CLEOTILDE ROGUE [[id number]]    -I have reviewed the patients home medicines and have made adjustments as needed     Reevaluation: After the interventions noted above, I reevaluated the patient and found that they have :improved  Co morbidities that complicate the patient evaluation  Past Medical History:  Diagnosis Date   Abdominal hernia    Asthma    Bipolar affective disorder (HCC)    Breast pain in female    09-23-2022  per pt right side due to previous chronic cough which resolved   Chronic diastolic CHF (congestive heart failure) (HCC) 2013   followed by dr t. raford   Chronic pain syndrome    COPD mixed type [facility])    pulmonologist--- dr jude;  pt not on any oxygen;   still smokes;    last exacerbation ED visit 11-23-2022 no  admission but followed up w/ Dr Jude 12-14-2022 treated;   prior to this admission w/ ARF 08-26-2022   Depression    follows w/ PCP   GAD (generalized anxiety disorder)    GERD (gastroesophageal reflux disease)    Headache    occasional   Heart murmur    Hepatic steatosis    followed by GI w/ [facility] ;    liver bx 05-14-2022 mild active steatohepatitis, fibrosis stage 0-1, early stages primary biliary cholangitis   History of acute respiratory failure    multiple admission for  COPd/ asthma exacerbation's w/ failure (09-22-2022  last admission 08-26-2022)   History of DVT (deep vein thrombosis)    05-02-2012  RUL thrombosis cephalic vein and 05-08-2012  LUE  superficial thrombus bachial vein PICC line site;  01-30-2014   LLE   History of substance abuse (HCC)    crack/ opiates/ cannibus  (08-23-2023  pt stated last used any type  2016)   Hyperlipidemia, mixed    Hypertension    coronary CT 04-01-2021   calcium  score=zero   Incontinence of feces with fecal urgency    s/p  stage one sacrum nerve stimulator  09/ 2023 by dr debby in office   Lymphedema of both lower extremities 06/07/2018   per pt wraps legs nightly   Morbid (severe) obesity due to excess calories (HCC)    Neuropathy    diabetic neuropathy and left leg post op lumbar surgery   OA (osteoarthritis)    OSA treated with BiPAP    pulmonologist--- dr jude;   per pt uses nightly   Overactive bladder    Schizophrenia (HCC)    Type 2 diabetes mellitus (HCC)    followed by pcp;   (08/23/23 -pt stated she checks BS tid, average fasting blood surgar of 90   Wears glasses       Dispostion: Discharged in stable condition.  Return precaution discussed.  Patient voices understanding and is in agreement with plan.    Final diagnoses:  COPD exacerbation (HCC)  Chronic low back pain without sciatica, unspecified back pain laterality    ED Discharge Orders     None          Hildegard Loge, PA-C 06/06/24 1440    Dreama Longs, MD 06/07/24 1029

## 2024-06-15 ENCOUNTER — Other Ambulatory Visit (HOSPITAL_COMMUNITY): Payer: Self-pay

## 2024-06-15 MED ORDER — VITAMIN D (ERGOCALCIFEROL) 1.25 MG (50000 UNIT) PO CAPS: 50000.0000 [IU] | ORAL_CAPSULE | ORAL | 2 refills | Status: DC

## 2024-06-15 MED ORDER — OXYCODONE HCL 10 MG PO TABS
10.0000 mg | ORAL_TABLET | Freq: Every day | ORAL | 0 refills | Status: DC
  Filled 2024-06-15: qty 180, 30d supply, fill #0

## 2024-06-15 NOTE — Patient Instructions (Addendum)
 SURGICAL WAITING ROOM VISITATION Patients having surgery or a procedure may have no more than 2 support people in the waiting area - these visitors may rotate in the visitor waiting room.   Due to an increase in RSV and influenza rates and associated hospitalizations, children ages 46 and under may not visit patients in [facility] hospitals. If the patient needs to stay at the hospital during part of their recovery, the visitor guidelines for inpatient rooms apply.  PRE-OP VISITATION  Pre-op nurse will coordinate an appropriate time for 1 support person to accompany the patient in pre-op.  This support person may not rotate.  This visitor will be contacted when the time is appropriate for the visitor to come back in the pre-op area.  Please refer to the [facility] website for the visitor guidelines for Inpatients (after your surgery is over and you are in a regular room).  You are not required to quarantine at this time prior to your surgery. However, you must do this: Hand Hygiene often Do NOT share personal items Notify your provider if you are in close contact with someone who has COVID or you develop fever 100.4 or greater, new onset of sneezing, cough, sore throat, shortness of breath or body aches.  If you test positive for Covid or have been in contact with anyone that has tested positive in the last 10 days please notify you surgeon.    Your procedure is scheduled on:  06/29/24  Report to [facility] Main Entrance: [address] entrance where the [employer] is available.   Report to admitting at: 8:15 AM  Call this number if you have any questions or problems the morning of surgery [phone]  FOLLOW ANY ADDITIONAL PRE OP INSTRUCTIONS YOU RECEIVED FROM YOUR SURGEON'S OFFICE!!!  Do not eat food or drink fluid after Midnight the night prior to your surgery/procedure.   Oral Hygiene is also important to reduce your risk of infection.        Remember -  BRUSH YOUR TEETH THE MORNING OF SURGERY WITH YOUR REGULAR TOOTHPASTE  Do NOT smoke after Midnight the night before surgery.  STOP TAKING all Vitamins, Herbs and supplements 1 week before your surgery.   Take ONLY these medicines the morning of surgery with A SIP OF WATER : hydroxyzine ,gabapentin ,duloxetine ,aripiprazole ,carvedilol ,metoprolol ,amlodipine ,omeprazole .use inhalers as usual and bring them.                   You may not have any metal on your body including hair pins, jewelry, and body piercing  Do not wear make-up, lotions, powders, perfumes / cologne, or deodorant  Do not wear nail polish including gel and S&S, artificial / acrylic nails, or any other type of covering on natural nails including finger and toenails. If you have artificial nails, gel coating, etc., that needs to be removed by a nail salon, Please have this removed prior to surgery. Not doing so may mean that your surgery could be cancelled or delayed if the Surgeon or anesthesia staff feels like they are unable to monitor you safely.   Do not shave 48 hours prior to surgery to avoid nicks in your skin which may contribute to postoperative infections.   Contacts, Hearing Aids, dentures or bridgework may not be worn into surgery. DENTURES WILL BE REMOVED PRIOR TO SURGERY PLEASE DO NOT APPLY Poly grip OR ADHESIVES!!!  You may bring a small overnight bag with you on the day of surgery, only pack  items that are not valuable.  IS NOT RESPONSIBLE   FOR VALUABLES THAT ARE LOST OR STOLEN.   Patients discharged on the day of surgery will not be allowed to drive home.  Someone NEEDS to stay with you for the first 24 hours after anesthesia.  Do not bring your home medications to the hospital. The Pharmacy will dispense medications listed on your medication list to you during your admission in the Hospital.  Special Instructions: Bring a copy of your healthcare power of attorney and living will documents the day of  surgery, if you wish to have them scanned into your [place] Medical Records- EPIC  Please read over the following fact sheets you were given: IF YOU HAVE QUESTIONS ABOUT YOUR PRE-OP INSTRUCTIONS, PLEASE CALL [phone]  PATIENT SIGNATURE_________________________________  NURSE SIGNATURE__________________________________  ________________________________________________________________________  [facility] - Preparing for Surgery Before surgery, you can play an important role.  Because skin is not sterile, your skin needs to be as free of germs as possible.  You can reduce the number of germs on your skin by washing with CHG (chlorahexidine gluconate) soap before surgery.  CHG is an antiseptic cleaner which kills germs and bonds with the skin to continue killing germs even after washing. Please DO NOT use if you have an allergy to CHG or antibacterial soaps.  If your skin becomes reddened/irritated stop using the CHG and inform your nurse when you arrive at Short Stay. Do not shave (including legs and underarms) for at least 48 hours prior to the first CHG shower.  You may shave your face/neck.  Please follow these instructions carefully:  1.  Shower with CHG Soap the night before surgery ONLY (DO NOT USE THE SOAP THE MORNING OF SURGERY).  2.  If you choose to wash your hair, wash your hair first as usual with your normal  shampoo.  3.  After you shampoo, rinse your hair and body thoroughly to remove the shampoo.                             4.  Use CHG as you would any other liquid soap.  You can apply chg directly to the skin and wash.  Gently with a scrungie or clean washcloth.  5.  Apply the CHG Soap to your body ONLY FROM THE NECK DOWN.   Do   not use on face/ open                           Wound or open sores. Avoid contact with eyes, ears mouth and   genitals (private parts).                       Wash face,  Genitals (private parts) with your normal soap.             6.  Wash  thoroughly, paying special attention to the area where your    surgery  will be performed.  7.  Thoroughly rinse your body with warm water  from the neck down.  8.  DO NOT shower/wash with your normal soap after using and rinsing off the CHG Soap.                9.  Pat yourself dry with a clean towel.            10.  Wear clean pajamas.  11.  Place clean sheets on your bed the night of your first shower and do not  sleep with pets. Day of Surgery : Do not apply any CHG, lotions/deodorants the morning of surgery.  Please wear clean clothes to the hospital/surgery center.  FAILURE TO FOLLOW THESE INSTRUCTIONS MAY RESULT IN THE CANCELLATION OF YOUR SURGERY  PATIENT SIGNATURE_________________________________  NURSE SIGNATURE__________________________________  ________________________________________________________________________

## 2024-06-16 ENCOUNTER — Encounter (HOSPITAL_COMMUNITY): Payer: Self-pay

## 2024-06-16 ENCOUNTER — Encounter (HOSPITAL_COMMUNITY)
Admission: RE | Admit: 2024-06-16 | Discharge: 2024-06-16 | Disposition: A | Discharge status: Home / Self Care | Source: Ambulatory Visit | Attending: General Surgery | Admitting: General Surgery

## 2024-06-16 ENCOUNTER — Other Ambulatory Visit: Payer: Self-pay

## 2024-06-16 VITALS — BP 180/95 | HR 80 | Temp 98.7°F | Ht 62.0 in | Wt 219.0 lb

## 2024-06-16 DIAGNOSIS — Z9682 Presence of neurostimulator: Secondary | ICD-10-CM | POA: Insufficient documentation

## 2024-06-16 DIAGNOSIS — Z86718 Personal history of other venous thrombosis and embolism: Secondary | ICD-10-CM | POA: Diagnosis not present

## 2024-06-16 DIAGNOSIS — Z86711 Personal history of pulmonary embolism: Secondary | ICD-10-CM | POA: Insufficient documentation

## 2024-06-16 DIAGNOSIS — Z6841 Body Mass Index (BMI) 40.0 and over, adult: Secondary | ICD-10-CM | POA: Insufficient documentation

## 2024-06-16 DIAGNOSIS — I110 Hypertensive heart disease with heart failure: Secondary | ICD-10-CM | POA: Insufficient documentation

## 2024-06-16 DIAGNOSIS — Z01812 Encounter for preprocedural laboratory examination: Secondary | ICD-10-CM | POA: Diagnosis not present

## 2024-06-16 DIAGNOSIS — F1911 Other psychoactive substance abuse, in remission: Secondary | ICD-10-CM | POA: Insufficient documentation

## 2024-06-16 DIAGNOSIS — E119 Type 2 diabetes mellitus without complications: Secondary | ICD-10-CM | POA: Diagnosis not present

## 2024-06-16 DIAGNOSIS — J4489 Other specified chronic obstructive pulmonary disease: Secondary | ICD-10-CM | POA: Diagnosis not present

## 2024-06-16 DIAGNOSIS — K219 Gastro-esophageal reflux disease without esophagitis: Secondary | ICD-10-CM | POA: Insufficient documentation

## 2024-06-16 DIAGNOSIS — F17210 Nicotine dependence, cigarettes, uncomplicated: Secondary | ICD-10-CM | POA: Diagnosis not present

## 2024-06-16 DIAGNOSIS — R152 Fecal urgency: Secondary | ICD-10-CM | POA: Diagnosis not present

## 2024-06-16 DIAGNOSIS — F209 Schizophrenia, unspecified: Secondary | ICD-10-CM | POA: Diagnosis not present

## 2024-06-16 DIAGNOSIS — R0609 Other forms of dyspnea: Secondary | ICD-10-CM | POA: Insufficient documentation

## 2024-06-16 DIAGNOSIS — I5032 Chronic diastolic (congestive) heart failure: Secondary | ICD-10-CM | POA: Insufficient documentation

## 2024-06-16 DIAGNOSIS — Z01818 Encounter for other preprocedural examination: Secondary | ICD-10-CM | POA: Diagnosis present

## 2024-06-16 DIAGNOSIS — Z981 Arthrodesis status: Secondary | ICD-10-CM | POA: Insufficient documentation

## 2024-06-16 DIAGNOSIS — M1990 Unspecified osteoarthritis, unspecified site: Secondary | ICD-10-CM | POA: Insufficient documentation

## 2024-06-16 DIAGNOSIS — F319 Bipolar disorder, unspecified: Secondary | ICD-10-CM | POA: Insufficient documentation

## 2024-06-16 DIAGNOSIS — G4733 Obstructive sleep apnea (adult) (pediatric): Secondary | ICD-10-CM | POA: Insufficient documentation

## 2024-06-16 DIAGNOSIS — E669 Obesity, unspecified: Secondary | ICD-10-CM | POA: Diagnosis not present

## 2024-06-16 DIAGNOSIS — G894 Chronic pain syndrome: Secondary | ICD-10-CM | POA: Insufficient documentation

## 2024-06-16 HISTORY — DX: Dyspnea, unspecified: R06.00

## 2024-06-16 HISTORY — DX: Pneumonia, unspecified organism: J18.9

## 2024-06-16 LAB — HEMOGLOBIN A1C
Hgb A1c MFr Bld: 5.6 % (ref 4.8–5.6)
Mean Plasma Glucose: 114.02 mg/dL

## 2024-06-16 LAB — GLUCOSE, CAPILLARY: Glucose-Capillary: 126 mg/dL — ABNORMAL HIGH (ref 70–99)

## 2024-06-16 NOTE — Progress Notes (Signed)
 For Anesthesia: PCP - Sebastian Jerilyn HERO, FNP  Cardiologist - Raford Riggs, MD . ARNETTA: 03/10/24  Bowel Prep reminder:  Chest x-ray - 06/06/24  CT angio: 06/06/24 EKG - 06/07/24 Stress Test -  ECHO - 07/15/23 Cardiac Cath -  Pacemaker/ICD device last checked: Pacemaker orders received: Device Rep notified:  Spinal Cord Stimulator: Yes  Sleep Study - Yes CPAP - Yes  Fasting Blood Sugar - 1030's Checks Blood Sugar _____ times a day Date and result of last Hgb A1c-  Last dose of GLP1 agonist- semaglutide : 06/16/24 GLP1 instructions: Hold 7 days prior to schedule (Hold 24 hours-daily)   Last dose of SGLT-2 inhibitors- N/A SGLT-2 instructions: Hold 72 hours prior to surgery  Blood Thinner Instructions:N/A Last Dose: Time last taken:  Aspirin  Instructions: Last Dose: Time last taken:  Activity level: Unable to go up a flight of stairs without chest pain and/or shortness of breath    Anesthesia review: Hx: CHF,COPD,Murmur,Smoker,Acute respiratory failure,DVT,HTN,OSA,DIA. Last ED admission: 06/06/24  Patient denies shortness of breath, fever, cough and chest pain at PAT appointment   Patient verbalized understanding of instructions that were reviewed over the telephone.

## 2024-06-20 ENCOUNTER — Encounter (HOSPITAL_COMMUNITY): Payer: Self-pay

## 2024-06-20 NOTE — Progress Notes (Signed)
 Case: [id number] Date/Time: 06/29/24 1015   Procedure: REMOVAL, NEUROSTIMULATOR, SACRAL - REMOVAL OF SNS SACRAL NERVE STIMULATOR   Anesthesia type: Monitor Anesthesia Care   Pre-op diagnosis: FECAL INCONTINENCE DEVICE MALFUNDCTION   Location: [facility] ROOM 01 / [facility]   Surgeons: Debby Hila, MD       DISCUSSION: Lori Bean is a 53 yo female with PMH of current smoking, HTN, HFpEF, heart murmur, hx of DVT/PE, OSA (uses Bipap), asthma, COPD, GERD, T2DM, arthritis, bipolar d/o, schizophrenia, depression, obesity (BMI 40), chronic pain syndrome with sacral nerve stimulator (09/2022), hx of lumbar fusion L5-S1 (2015), hx of SUD (crack cocaine, opiates, THC).  Patient follows with Cardiology for HTN/HFpEF and palpitations. Last seen on 03/10/24 by Dr. Raford. Palpitation symptoms were minimal so no further testing recommended. BP controlled at that visit. Advised f/u in 1 year.  Patient seen in the ED on 9/23 for chest pain, SOB, back pain, cough. She had CTA chest to r/o PE which was negative. Labs, EKG, viral respiratory panel were negative. She was treated for acute on chronic low back pain and a COPD exacerbation.  At PAT visit patient reports chronic DOE. She is a current smoker. She denies any CP/SOB at rest.  LD Ozempic : 10/3  VS: BP (!) 180/95 Comment: pt. did not take BP med. yet.  Pulse 80   Temp 37.1 C (Oral)   Ht 5' 2 (1.575 m)   Wt 99.3 kg   LMP 10/15/2018   SpO2 100%   BMI 40.06 kg/m   PROVIDERS: Sebastian Jerilyn HERO, FNP   LABS: Labs reviewed: Acceptable for surgery. (all labs ordered are listed, but only abnormal results are displayed)  Labs Reviewed  GLUCOSE, CAPILLARY - Abnormal; Notable for the following components:      Result Value   Glucose-Capillary 126 (*)    All other components within normal limits  HEMOGLOBIN A1C     CTA Chest 06/06/24:  IMPRESSION: No suspicious findings to suggest pulmonary embolism.   Subsegmental atelectasis of  bilateral lung base right-greater-than-left, increased to prior.   Scattered pulmonary nodules up to 4 mm, stable to prior. Non-contrast chest CT can be considered in 12 months if patient is high-risk. This recommendation follows the consensus statement: Guidelines for Management of Incidental Pulmonary Nodules Detected on CT Images: From the Fleischner Society 2017; Radiology 2017; 284:228-243.    EKG 06/06/24: Sinus rhythm ST elev (in V5), probable normal early repol pattern   Echo 07/15/23:  IMPRESSIONS    1. Left ventricular ejection fraction, by estimation, is 65 to 70%. The left ventricle has normal function. The left ventricle has no regional wall motion abnormalities. Left ventricular diastolic parameters are indeterminate.  2. Right ventricular systolic function is normal. The right ventricular size is normal.  3. The mitral valve is normal in structure. Trivial mitral valve regurgitation.  4. The aortic valve is tricuspid. Aortic valve regurgitation is not visualized. Aortic valve sclerosis is present, with no evidence of aortic valve stenosis.  5. The inferior vena cava is normal in size with greater than 50% respiratory variability, suggesting right atrial pressure of 3 mmHg.  Stress test 07/05/23:    The study is normal. The study is low risk.   No ST deviation was noted.   LV perfusion is normal. There is no evidence of ischemia. There is no evidence of infarction.   Left ventricular function is normal. Nuclear stress EF: 56%. The left ventricular ejection fraction is normal (55-65%). End diastolic cavity size is  mildly enlarged. End systolic cavity size is normal.   Prior study available for comparison from 05/26/2015. No changes compared to prior study. Past Medical History:  Diagnosis Date   Abdominal hernia    Asthma    Bipolar affective disorder (HCC)    Breast pain in female    09-23-2022  per pt right side due to previous chronic cough which resolved    Chronic diastolic CHF (congestive heart failure) (HCC) 2013   followed by dr t. raford   Chronic pain syndrome    COPD mixed type [facility])    pulmonologist--- dr jude;  pt not on any oxygen;   still smokes;    last exacerbation ED visit 11-23-2022 no admission but followed up w/ Dr jude 12-14-2022 treated;   prior to this admission w/ ARF 08-26-2022   Depression    follows w/ PCP   Dyspnea    GAD (generalized anxiety disorder)    GERD (gastroesophageal reflux disease)    Headache    occasional   Heart murmur    Hepatic steatosis    followed by GI w/ [facility] ;    liver bx 05-14-2022 mild active steatohepatitis, fibrosis stage 0-1, early stages primary biliary cholangitis   History of acute respiratory failure    multiple admission for  COPd/ asthma exacerbation's w/ failure (09-22-2022  last admission 08-26-2022)   History of DVT (deep vein thrombosis)    05-02-2012  RUL thrombosis cephalic vein and 05-08-2012  LUE  superficial thrombus bachial vein PICC line site;  01-30-2014   LLE   History of substance abuse (HCC)    crack/ opiates/ cannibus  (08-23-2023  pt stated last used any type 2016)   Hyperlipidemia, mixed    Hypertension    coronary CT 04-01-2021   calcium  score=zero   Incontinence of feces with fecal urgency    s/p  stage one sacrum nerve stimulator  09/ 2023 by dr debby in office   Lymphedema of both lower extremities 06/07/2018   per pt wraps legs nightly   Morbid (severe) obesity due to excess calories (HCC)    Neuropathy    diabetic neuropathy and left leg post op lumbar surgery   OA (osteoarthritis)    OSA treated with BiPAP    pulmonologist--- dr jude;   per pt uses nightly   Overactive bladder    Pneumonia    Schizophrenia (HCC)    Type 2 diabetes mellitus (HCC)    followed by pcp;   (08/23/23 -pt stated she checks BS tid, average fasting blood surgar of 90   Wears glasses     Past Surgical History:  Procedure Laterality Date   CHOLECYSTECTOMY,  LAPAROSCOPIC  11/09/2007   @[facility]  by dr ethyl   COLONOSCOPY WITH PROPOFOL  N/A 01/23/2016   Procedure: COLONOSCOPY WITH PROPOFOL ;  Surgeon: Lesta JULIANNA Fitz, MD;  Location: [facility];  Service: Endoscopy;  Laterality: N/A;   KNEE ARTHROSCOPY Left    KNEE ARTHROSCOPY WITH MENISCAL REPAIR Right 09/28/2016   Procedure: KNEE ARTHROSCOPY WITH MENISCAL REPAIR;  Surgeon: Norleen Gavel, MD;  Location: [facility];  Service: Orthopedics;  Laterality: Right;  Right partial meniscectomy and chondroplasty, patellar/femoral joint and medial femoral condyle    LUMBAR LAMINECTOMY/DECOMPRESSION MICRODISCECTOMY  04/01/2012   Procedure: LUMBAR LAMINECTOMY/DECOMPRESSION MICRODISCECTOMY 2 LEVELS;  Surgeon: Darina MALVA Boehringer, MD;  Location: [facility];  Service: Neurosurgery;  Laterality: Left;  Lumbar four-five, lumbar five sacral one microdiscectomy    LUMBAR WOUND DEBRIDEMENT  04/29/2012   Procedure:  LUMBAR WOUND DEBRIDEMENT;  Surgeon: Darina MALVA Boehringer, MD;  Location: [facility];  Service: Neurosurgery;  Laterality: N/A;  lumbar wound debridement   ORIF ORBITAL FRACTURE Right 1989   per pt has retained hardware   POSTERIOR FUSION LUMBAR SPINE  12/28/2013   @[facility]  by dr boehringer;   bilateral L5-S1 laminectomy / discectomy/ decompression / fusion   ROTATOR CUFF REPAIR Right    yrs ago   SACRAL NERVE STIMULATOR IMPLANTATION  09/25/2022   [facility] - Dr. Debby   TUBAL LIGATION Bilateral 1999   [facility]    MEDICATIONS:  albuterol  (PROVENTIL ) (2.5 MG/3ML) 0.083% nebulizer solution   albuterol  (VENTOLIN  HFA) 108 (90 Base) MCG/ACT inhaler   amLODipine  (NORVASC ) 5 MG tablet   amoxicillin -clavulanate (AUGMENTIN ) 875-125 MG tablet   ARIPiprazole  (ABILIFY ) 20 MG tablet   atorvastatin  (LIPITOR ) 40 MG tablet   budesonide -formoterol  (SYMBICORT ) 160-4.5 MCG/ACT inhaler   carvedilol  (COREG ) 12.5 MG tablet   DULOXETINE  HCL PO   furosemide  (LASIX ) 40 MG tablet   gabapentin  (NEURONTIN ) 300 MG capsule   hydrOXYzine   (ATARAX ) 25 MG tablet   lidocaine  (LIDODERM ) 5 %   methocarbamol  (ROBAXIN ) 500 MG tablet   metoprolol  succinate (TOPROL -XL) 50 MG 24 hr tablet   Multiple Vitamins-Minerals (MULTIVITAMIN WITH MINERALS) tablet   naloxone  (NARCAN ) nasal spray 4 mg/0.1 mL   omeprazole  (PRILOSEC) 20 MG capsule   Oxycodone  HCl 10 MG TABS   Semaglutide , 1 MG/DOSE, (OZEMPIC , 1 MG/DOSE,) 4 MG/3ML SOPN   triamterene -hydrochlorothiazide  (DYAZIDE ) 37.5-25 MG capsule   valsartan  (DIOVAN ) 80 MG tablet   Vitamin D , Ergocalciferol , (DRISDOL ) 1.25 MG (50000 UNIT) CAPS capsule   Vitamin D , Ergocalciferol , (DRISDOL ) 1.25 MG (50000 UNIT) CAPS capsule   No current facility-administered medications for this encounter.    Burnard CHRISTELLA Odis DEVONNA [facility] Short Stay/Anesthesiology [facility] Phone [phone] 06/20/2024 8:53 AM

## 2024-06-20 NOTE — Anesthesia Preprocedure Evaluation (Signed)
 Anesthesia Evaluation    Airway        Dental   Pulmonary Current Smoker          Cardiovascular hypertension,      Neuro/Psych    GI/Hepatic   Endo/Other  diabetes    Renal/GU      Musculoskeletal   Abdominal   Peds  Hematology   Anesthesia Other Findings   Reproductive/Obstetrics                              Anesthesia Physical Anesthesia Plan  ASA:   Anesthesia Plan:    Post-op Pain Management:    Induction:   PONV Risk Score and Plan:   Airway Management Planned:   Additional Equipment:   Intra-op Plan:   Post-operative Plan:   Informed Consent:   Plan Discussed with:   Anesthesia Plan Comments: (See PAT note from 10/3)         Anesthesia Quick Evaluation

## 2024-06-29 ENCOUNTER — Emergency Department (HOSPITAL_COMMUNITY)

## 2024-06-29 ENCOUNTER — Observation Stay (HOSPITAL_COMMUNITY)
Admission: EM | Admit: 2024-06-29 | Discharge: 2024-06-30 | Disposition: A | Discharge status: Home / Self Care | Attending: Emergency Medicine | Admitting: Emergency Medicine

## 2024-06-29 ENCOUNTER — Encounter (HOSPITAL_COMMUNITY): Payer: Self-pay | Admitting: Medical

## 2024-06-29 ENCOUNTER — Encounter (HOSPITAL_COMMUNITY): Payer: Self-pay

## 2024-06-29 ENCOUNTER — Ambulatory Visit (HOSPITAL_COMMUNITY)
Admission: RE | Admit: 2024-06-29 | Discharge: 2024-06-29 | Disposition: A | Discharge status: Hospice | Attending: General Surgery | Admitting: General Surgery

## 2024-06-29 ENCOUNTER — Encounter (HOSPITAL_COMMUNITY)
Admission: RE | Disposition: A | Discharge status: Hospice | Payer: Self-pay | Source: Home / Self Care | Attending: General Surgery

## 2024-06-29 DIAGNOSIS — Z720 Tobacco use: Secondary | ICD-10-CM | POA: Diagnosis present

## 2024-06-29 DIAGNOSIS — F1290 Cannabis use, unspecified, uncomplicated: Secondary | ICD-10-CM | POA: Diagnosis not present

## 2024-06-29 DIAGNOSIS — Z8709 Personal history of other diseases of the respiratory system: Secondary | ICD-10-CM | POA: Insufficient documentation

## 2024-06-29 DIAGNOSIS — E119 Type 2 diabetes mellitus without complications: Secondary | ICD-10-CM | POA: Insufficient documentation

## 2024-06-29 DIAGNOSIS — F32A Depression, unspecified: Secondary | ICD-10-CM | POA: Diagnosis not present

## 2024-06-29 DIAGNOSIS — Z79899 Other long term (current) drug therapy: Secondary | ICD-10-CM | POA: Diagnosis not present

## 2024-06-29 DIAGNOSIS — Z7982 Long term (current) use of aspirin: Secondary | ICD-10-CM | POA: Diagnosis not present

## 2024-06-29 DIAGNOSIS — E785 Hyperlipidemia, unspecified: Secondary | ICD-10-CM | POA: Diagnosis not present

## 2024-06-29 DIAGNOSIS — R471 Dysarthria and anarthria: Principal | ICD-10-CM | POA: Diagnosis present

## 2024-06-29 DIAGNOSIS — E1142 Type 2 diabetes mellitus with diabetic polyneuropathy: Secondary | ICD-10-CM

## 2024-06-29 DIAGNOSIS — G459 Transient cerebral ischemic attack, unspecified: Secondary | ICD-10-CM | POA: Diagnosis not present

## 2024-06-29 DIAGNOSIS — R4781 Slurred speech: Secondary | ICD-10-CM | POA: Diagnosis present

## 2024-06-29 DIAGNOSIS — Z8679 Personal history of other diseases of the circulatory system: Secondary | ICD-10-CM

## 2024-06-29 DIAGNOSIS — I10 Essential (primary) hypertension: Secondary | ICD-10-CM | POA: Diagnosis not present

## 2024-06-29 DIAGNOSIS — F17210 Nicotine dependence, cigarettes, uncomplicated: Secondary | ICD-10-CM | POA: Diagnosis not present

## 2024-06-29 DIAGNOSIS — E782 Mixed hyperlipidemia: Secondary | ICD-10-CM

## 2024-06-29 LAB — DIFFERENTIAL
Abs Immature Granulocytes: 0.03 K/uL (ref 0.00–0.07)
Basophils Absolute: 0 K/uL (ref 0.0–0.1)
Basophils Relative: 0 %
Eosinophils Absolute: 0.6 K/uL — ABNORMAL HIGH (ref 0.0–0.5)
Eosinophils Relative: 6 %
Immature Granulocytes: 0 %
Lymphocytes Relative: 27 %
Lymphs Abs: 2.5 K/uL (ref 0.7–4.0)
Monocytes Absolute: 0.8 K/uL (ref 0.1–1.0)
Monocytes Relative: 9 %
Neutro Abs: 5.4 K/uL (ref 1.7–7.7)
Neutrophils Relative %: 58 %

## 2024-06-29 LAB — URINALYSIS, ROUTINE W REFLEX MICROSCOPIC
Bilirubin Urine: NEGATIVE
Glucose, UA: NEGATIVE mg/dL
Hgb urine dipstick: NEGATIVE
Ketones, ur: NEGATIVE mg/dL
Leukocytes,Ua: NEGATIVE
Nitrite: NEGATIVE
Protein, ur: NEGATIVE mg/dL
Specific Gravity, Urine: 1.006 (ref 1.005–1.030)
pH: 6 (ref 5.0–8.0)

## 2024-06-29 LAB — I-STAT CHEM 8, ED
BUN: 19 mg/dL (ref 6–20)
Calcium, Ion: 1.14 mmol/L — ABNORMAL LOW (ref 1.15–1.40)
Chloride: 108 mmol/L (ref 98–111)
Creatinine, Ser: 0.6 mg/dL (ref 0.44–1.00)
Glucose, Bld: 92 mg/dL (ref 70–99)
HCT: 43 % (ref 36.0–46.0)
Hemoglobin: 14.6 g/dL (ref 12.0–15.0)
Potassium: 5.2 mmol/L — ABNORMAL HIGH (ref 3.5–5.1)
Sodium: 141 mmol/L (ref 135–145)
TCO2: 26 mmol/L (ref 22–32)

## 2024-06-29 LAB — CBC
HCT: 45.7 % (ref 36.0–46.0)
Hemoglobin: 14.4 g/dL (ref 12.0–15.0)
MCH: 30.7 pg (ref 26.0–34.0)
MCHC: 31.5 g/dL (ref 30.0–36.0)
MCV: 97.4 fL (ref 80.0–100.0)
Platelets: 216 K/uL (ref 150–400)
RBC: 4.69 MIL/uL (ref 3.87–5.11)
RDW: 13.8 % (ref 11.5–15.5)
WBC: 9.3 K/uL (ref 4.0–10.5)
nRBC: 0 % (ref 0.0–0.2)

## 2024-06-29 LAB — GLUCOSE, CAPILLARY: Glucose-Capillary: 107 mg/dL — ABNORMAL HIGH (ref 70–99)

## 2024-06-29 LAB — COMPREHENSIVE METABOLIC PANEL WITH GFR
ALT: 12 U/L (ref 0–44)
AST: 14 U/L — ABNORMAL LOW (ref 15–41)
Albumin: 4.1 g/dL (ref 3.5–5.0)
Alkaline Phosphatase: 82 U/L (ref 38–126)
Anion gap: 11 (ref 5–15)
BUN: 16 mg/dL (ref 6–20)
CO2: 23 mmol/L (ref 22–32)
Calcium: 10.1 mg/dL (ref 8.9–10.3)
Chloride: 104 mmol/L (ref 98–111)
Creatinine, Ser: 0.67 mg/dL (ref 0.44–1.00)
GFR, Estimated: >60 mL/min (ref >60)
Glucose, Bld: 102 mg/dL — ABNORMAL HIGH (ref 70–99)
Potassium: 4.7 mmol/L (ref 3.5–5.1)
Sodium: 137 mmol/L (ref 135–145)
Total Bilirubin: 0.4 mg/dL (ref 0.0–1.2)
Total Protein: 7.6 g/dL (ref 6.5–8.1)

## 2024-06-29 LAB — CBG MONITORING, ED
Glucose-Capillary: 71 mg/dL (ref 70–99)
Glucose-Capillary: 78 mg/dL (ref 70–99)

## 2024-06-29 LAB — URINE DRUG SCREEN
Amphetamines: NEGATIVE
Barbiturates: NEGATIVE
Benzodiazepines: NEGATIVE
Cocaine: NEGATIVE
Fentanyl: NEGATIVE
Methadone Scn, Ur: NEGATIVE
Opiates: NEGATIVE
Tetrahydrocannabinol: NEGATIVE

## 2024-06-29 LAB — APTT: aPTT: 24 s (ref 24–36)

## 2024-06-29 LAB — ETHANOL: Alcohol, Ethyl (B): <15 mg/dL (ref <15)

## 2024-06-29 LAB — PROTIME-INR
INR: 0.9 (ref 0.8–1.2)
Prothrombin Time: 12.5 s (ref 11.4–15.2)

## 2024-06-29 SURGERY — REMOVAL, NEUROSTIMULATOR, SACRAL
Anesthesia: Monitor Anesthesia Care

## 2024-06-29 MED ORDER — OXYCODONE HCL 5 MG PO TABS
10.0000 mg | ORAL_TABLET | Freq: Once | ORAL | Status: AC
  Administered 2024-06-29: 10 mg via ORAL
  Filled 2024-06-29: qty 2

## 2024-06-29 MED ORDER — IOHEXOL 350 MG/ML SOLN
75.0000 mL | Freq: Once | INTRAVENOUS | Status: AC | PRN
  Administered 2024-06-29: 75 mL via INTRAVENOUS

## 2024-06-29 NOTE — ED Provider Notes (Signed)
 Care was taken over from Dr. Elnor.  Patient presented with some slight drooping of the left side of face with some slurred speech.  Plan was to admit for TIA workup.  Per neurology, patient could stay at a long if CTA was negative but if positive will need transfer to [facility].  CTA negative for LVO, no evidence of dissection.  Discussed with Dr. Cathern who admit the patient for TIA workup.   Lenor Hollering, MD 06/29/24 2330

## 2024-06-29 NOTE — ED Notes (Signed)
 EDP informed  patient diffcult IV/lab stick. Orders for IV team placed per MD.

## 2024-06-29 NOTE — ED Provider Notes (Signed)
 [place] EMERGENCY DEPARTMENT AT [facility] Provider Note  CSN: [id number] Arrival date & time: 06/29/24 9149  Chief Complaint(s) Aphasia  HPI Lori Bean is a 53 y.o. female with past medical history as below, significant for diastolic heart failure, bipolar affective disorder, GAD, GERD, hepatic steatosis who presents to the ED with complaint of dysarthria  Patient reports she went bed last night around 1130p or midnight.  Reports she was in her normal state of health but did have a headache.  She woke up this morning for her surgery around 6 or 7 AM she was having difficulty speaking.  Slurring her words.  Family member inquired if she had been drinking alcohol.  She denies any numbness or weakness to her extremities, no vision or hearing changes.  Her headache has subsided.  Denies recent fall or head injury.  No difficulty swallowing, difficulty breathing, chest pain or abdominal pain.  Patient was scheduled to have her neurostimulator removed this AM by Dr. Debby but was sent to the ER from short stay when she revealed that she was having dysarthria  Past Medical History Past Medical History:  Diagnosis Date   Abdominal hernia    Asthma    Bipolar affective disorder (HCC)    Breast pain in female    09-23-2022  per pt right side due to previous chronic cough which resolved   Chronic diastolic CHF (congestive heart failure) (HCC) 2013   followed by dr t. raford   Chronic pain syndrome    COPD mixed type [facility])    pulmonologist--- dr jude;  pt not on any oxygen;   still smokes;    last exacerbation ED visit 11-23-2022 no admission but followed up w/ Dr jude 12-14-2022 treated;   prior to this admission w/ ARF 08-26-2022   Depression    follows w/ PCP   Dyspnea    GAD (generalized anxiety disorder)    GERD (gastroesophageal reflux disease)    Headache    occasional   Heart murmur    Hepatic steatosis    followed by GI w/ [facility] ;    liver bx  05-14-2022 mild active steatohepatitis, fibrosis stage 0-1, early stages primary biliary cholangitis   History of acute respiratory failure    multiple admission for  COPd/ asthma exacerbation's w/ failure (09-22-2022  last admission 08-26-2022)   History of DVT (deep vein thrombosis)    05-02-2012  RUL thrombosis cephalic vein and 05-08-2012  LUE  superficial thrombus bachial vein PICC line site;  01-30-2014   LLE   History of substance abuse (HCC)    crack/ opiates/ cannibus  (08-23-2023  pt stated last used any type 2016)   Hyperlipidemia, mixed    Hypertension    coronary CT 04-01-2021   calcium  score=zero   Incontinence of feces with fecal urgency    s/p  stage one sacrum nerve stimulator  09/ 2023 by dr debby in office   Lymphedema of both lower extremities 06/07/2018   per pt wraps legs nightly   Morbid (severe) obesity due to excess calories (HCC)    Neuropathy    diabetic neuropathy and left leg post op lumbar surgery   OA (osteoarthritis)    OSA treated with BiPAP    pulmonologist--- dr jude;   per pt uses nightly   Overactive bladder    Pneumonia    Schizophrenia (HCC)    Type 2 diabetes mellitus (HCC)    followed by pcp;   (08/23/23 -pt  stated she checks BS tid, average fasting blood surgar of 90   Wears glasses    Patient Active Problem List   Diagnosis Date Noted   TIA (transient ischemic attack) 06/30/2024   HLD (hyperlipidemia) 06/30/2024   History of essential hypertension 06/30/2024   History of COPD 06/30/2024   Depression 06/30/2024   Tobacco abuse 06/30/2024   Dysarthria 06/29/2024   COPD with acute exacerbation (HCC) 08/27/2022   COPD (chronic obstructive pulmonary disease) (HCC) 08/26/2022   Demand ischemia (HCC)    Neck pain 10/18/2020   DM2 (diabetes mellitus, type 2) (HCC) 06/28/2020   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/2019   COPD mixed type (HCC) 08/17/2018   Sinusitis 07/22/2018   Lymphedema 06/07/2018   Abnormal uterine bleeding (AUB) 12/12/2017   CHF  (congestive heart failure) (HCC)    Dyspnea on exertion 05/05/2017   Morbid obesity due to excess calories (HCC) 05/05/2017   Chondromalacia, right knee 10/09/2016   Synovial plica of right knee 10/09/2016   Tear of medial meniscus of right knee, initial encounter 09/28/2016   OSA (obstructive sleep apnea) 09/23/2016   Chronic diastolic heart failure (HCC) 04/22/2016   Cigarette smoker 04/03/2014   Chest pain 04/03/2014   Lumbar spondylosis 12/28/2013   Extrinsic asthma 06/07/2013   Anxiety    Essential hypertension    Bipolar affective disorder (HCC)    GERD (gastroesophageal reflux disease)    Arthritis    Schizophrenia (HCC)    Overactive bladder    Home Medication(s) Prior to Admission medications   Medication Sig Start Date End Date Taking? Authorizing Provider  albuterol  (PROVENTIL ) (2.5 MG/3ML) 0.083% nebulizer solution Take 2.5 mg by nebulization every 6 (six) hours as needed for wheezing or shortness of breath. 08/12/22  Yes [provider]  albuterol  (VENTOLIN  HFA) 108 (90 Base) MCG/ACT inhaler Inhale 1-2 puffs into the lungs every 6 (six) hours as needed for wheezing or shortness of breath. 01/31/24  Yes Jude Harden GAILS, MD  amLODipine  (NORVASC ) 5 MG tablet Take 5 mg by mouth daily. 12/30/23  Yes [provider]  ARIPiprazole  (ABILIFY ) 20 MG tablet Take 1 tablet (20 mg total) by mouth daily. 06/02/23  Yes   budesonide -formoterol  (SYMBICORT ) 160-4.5 MCG/ACT inhaler Inhale 1 puff into the lungs 2 (two) times daily. 10/19/23  Yes   carvedilol  (COREG ) 12.5 MG tablet Take 1 tablet (12.5 mg total) by mouth 2 (two) times daily. 06/22/23 06/30/24 Yes Swinyer, Rosaline HERO, NP  DULOXETINE  HCL PO Take by mouth 2 (two) times daily.   Yes [provider]  furosemide  (LASIX ) 40 MG tablet Take 1 tablet (40 mg total) by mouth daily. Please take 40mg  twice daily x2 days, then 40mg  daily Patient taking differently: Take 80 mg by mouth daily. 10/05/22 06/30/24 Yes Raford Riggs, MD  gabapentin  (NEURONTIN ) 300 MG capsule Take 300 mg by mouth 3 (three) times daily. 02/02/19  Yes [provider]  hydrOXYzine  (ATARAX ) 25 MG tablet Take 25 mg by mouth 3 (three) times daily. 12/09/22  Yes [provider]  methocarbamol  (ROBAXIN ) 500 MG tablet Take 1 tablet (500 mg total) by mouth 3 (three) times daily as needed for muscle spasms. 02/17/24  Yes   Multiple Vitamins-Minerals (MULTIVITAMIN WITH MINERALS) tablet Take 1 tablet by mouth daily. Woman   Yes [provider]  naloxone  (NARCAN ) nasal spray 4 mg/0.1 mL Use 1 spray every 2 minutes as needed for opioid overdose; spray 1 dose into ONE nostril; alternate nostrils with each dose until help arrives 04/17/24  Yes   Oxycodone  HCl 10 MG TABS Take 1 tablet (10 mg total) by mouth 6 (six) times daily as needed for pain. 06/15/24  Yes   Semaglutide , 1 MG/DOSE, (OZEMPIC , 1 MG/DOSE,) 4 MG/3ML SOPN Inject 1 mg into the skin once a week. Patient taking differently: Inject 2 mg into the skin once a week. 10/19/23  Yes   triamterene -hydrochlorothiazide  (DYAZIDE ) 37.5-25 MG capsule Take 1 capsule by mouth daily.   Yes [provider]  valsartan  (DIOVAN ) 80 MG tablet TAKE 1 TABLET(80 MG) BY MOUTH DAILY 12/09/23  Yes Raford Riggs, MD  Vitamin D , Ergocalciferol , (DRISDOL ) 1.25 MG (50000 UNIT) CAPS capsule Take 1 capsule (50,000 Units total) by mouth once a week. 06/02/24  Yes   amoxicillin -clavulanate (AUGMENTIN ) 875-125 MG tablet Take 1 tablet by mouth every 12 (twelve) hours. Patient not taking: Reported on 06/14/2024 06/06/24   Hildegard Loge, PA-C  atorvastatin  (LIPITOR ) 40 MG tablet Take 1 tablet (40 mg total) by mouth daily. Patient not taking: Reported on 06/30/2024 06/26/22   Vannie Reche RAMAN, NP  lidocaine  (LIDODERM ) 5 % Place 1 patch onto the skin daily. Remove & Discard patch within 12 hours or as directed by MD Patient not taking: Reported on 06/14/2024 06/06/24   Hildegard Loge, PA-C  metoprolol  succinate  (TOPROL -XL) 50 MG 24 hr tablet Take 50 mg by mouth daily. Take with or immediately following a meal. Patient not taking: Reported on 06/30/2024    [provider]  omeprazole  (PRILOSEC) 20 MG capsule Take 20 mg by mouth as needed (GERD). 10/23/23   [provider]                                                                                                                                    Past Surgical History Past Surgical History:  Procedure Laterality Date   CHOLECYSTECTOMY, LAPAROSCOPIC  11/09/2007   @[facility]  by dr ethyl   COLONOSCOPY WITH PROPOFOL  N/A 01/23/2016   Procedure: COLONOSCOPY WITH PROPOFOL ;  Surgeon: Lesta JULIANNA Fitz, MD;  Location: [facility];  Service: Endoscopy;  Laterality: N/A;   KNEE ARTHROSCOPY Left    KNEE ARTHROSCOPY WITH MENISCAL REPAIR Right 09/28/2016   Procedure: KNEE ARTHROSCOPY WITH MENISCAL REPAIR;  Surgeon: Norleen Gavel, MD;  Location: [facility] OR;  Service: Orthopedics;  Laterality: Right;  Right partial meniscectomy and chondroplasty, patellar/femoral joint and medial femoral condyle    LUMBAR LAMINECTOMY/DECOMPRESSION MICRODISCECTOMY  04/01/2012   Procedure: LUMBAR LAMINECTOMY/DECOMPRESSION MICRODISCECTOMY 2 LEVELS;  Surgeon: Darina MALVA Boehringer, MD;  Location: [facility]UR[facility];  Service: Neurosurgery;  Laterality: Left;  Lumbar four-five, lumbar five sacral one microdiscectomy    LUMBAR WOUND DEBRIDEMENT  04/29/2012   Procedure: LUMBAR WOUND DEBRIDEMENT;  Surgeon: Darina MALVA Boehringer, MD;  Location: [facility]UR[facility];  Service: Neurosurgery;  Laterality: N/A;  lumbar wound debridement   ORIF ORBITAL FRACTURE Right 1989   per pt has retained hardware   POSTERIOR FUSION LUMBAR SPINE  12/28/2013   @[facility]  by dr carles;   bilateral L5-S1 laminectomy / discectomy/ decompression / fusion   ROTATOR CUFF REPAIR Right    yrs ago   SACRAL NERVE STIMULATOR IMPLANTATION  09/25/2022   [facility] - Dr. Debby   TUBAL LIGATION Bilateral 1999   PPTL   Family  History Family History  Problem Relation Age of Onset   Diabetes Mother    Hypertension Mother    Diabetes Father    Heart disease Paternal Aunt    Cancer Paternal Aunt    Esophageal cancer Neg Hx    Stomach cancer Neg Hx     Social History Social History   Tobacco Use   Smoking status: Every Day    Current packs/day: 0.50    Average packs/day: 0.5 packs/day for 25.0 years (12.5 ttl pk-yrs)    Types: Cigarettes    Passive exposure: Current   Smokeless tobacco: Never   Tobacco comments:    09-23-2022  per pt trying to quit again ,  has smoked off and on multiple times / 08/23/23 smokes 1/2ppd  Vaping Use   Vaping status: Former   Devices: per pt quit 2016  Substance Use Topics   Alcohol use: Not Currently    Comment: quit Nov. 2014   Drug use: Not Currently    Types: Cocaine, Crack cocaine, Marijuana    Comment: 08/23/23  per pt last use of any type 2016   Allergies Aspirin   Review of Systems A thorough review of systems was obtained and all systems are negative except as noted in the HPI and PMH.   Physical Exam Vital Signs  I have reviewed the triage vital signs BP (!) 150/99   Pulse 84   Temp 98.4 F (36.9 C) (Oral)   Resp 17   LMP 10/15/2018   SpO2 94%  Physical Exam Vitals and nursing note reviewed.  Constitutional:      General: She is not in acute distress.    Appearance: Normal appearance. She is well-developed. She is not ill-appearing.  HENT:     Head: Normocephalic and atraumatic.     Right Ear: External ear normal.     Left Ear: External ear normal.     Nose: Nose normal.     Mouth/Throat:     Mouth: Mucous membranes are moist.  Eyes:     General: No scleral icterus.       Right eye: No discharge.        Left eye: No discharge.     Extraocular Movements: Extraocular movements intact.     Pupils: Pupils are equal, round, and reactive to light.  Cardiovascular:     Rate and Rhythm: Normal rate.  Pulmonary:     Effort: Pulmonary effort is  normal. No respiratory distress.     Breath sounds: No stridor.  Abdominal:     General: Abdomen is flat. There is no distension.     Tenderness: There is no guarding.  Musculoskeletal:        General: No deformity.     Cervical back: No rigidity.  Skin:    General: Skin is warm and dry.     Coloration: Skin is not cyanotic, jaundiced or pale.  Neurological:     Mental Status: She is alert and oriented to person, place, and time.     GCS: GCS eye subscore is 4. GCS verbal subscore is 5. GCS motor subscore is 6.     Cranial Nerves: Dysarthria  and facial asymmetry present.     Sensory: Sensation is intact. No sensory deficit.     Motor: Motor function is intact. No tremor or pronator drift.     Coordination: Coordination is intact. Finger-Nose-Finger Test and Heel to [facility] Test normal.     Comments: Slight labial flattening left Strength 5/5 to BLUE/BLLE, equal and symmetric  Gait testing deferred secondary to patient safety.   Psychiatric:        Speech: Speech normal.        Behavior: Behavior normal. Behavior is cooperative.     ED Results and Treatments Labs (all labs ordered are listed, but only abnormal results are displayed) Labs Reviewed  DIFFERENTIAL - Abnormal; Notable for the following components:      Result Value   Eosinophils Absolute 0.6 (*)    All other components within normal limits  COMPREHENSIVE METABOLIC PANEL WITH GFR - Abnormal; Notable for the following components:   Glucose, Bld 102 (*)    AST 14 (*)    All other components within normal limits  URINALYSIS, ROUTINE W REFLEX MICROSCOPIC - Abnormal; Notable for the following components:   Color, Urine STRAW (*)    All other components within normal limits  I-STAT CHEM 8, ED - Abnormal; Notable for the following components:   Potassium 5.2 (*)    Calcium , Ion 1.14 (*)    All other components within normal limits  CBC  ETHANOL  URINE DRUG SCREEN  PROTIME-INR  APTT  MAGNESIUM   LIPID PANEL  CBC  WITH DIFFERENTIAL/PLATELET  CBC WITH DIFFERENTIAL/PLATELET  COMPREHENSIVE METABOLIC PANEL WITH GFR  CBG MONITORING, ED  CBG MONITORING, ED                                                                                                                          Radiology CT ANGIO HEAD NECK W WO CM Result Date: 06/29/2024 EXAM: CTA HEAD AND NECK WITH AND WITHOUT 06/29/2024 09:38:23 PM TECHNIQUE: CTA of the head and neck was performed with and without the administration of 75 mL of iohexol  (OMNIPAQUE ) 350 MG/ML injection. Multiplanar 2D and/or 3D reformatted images are provided for review. Automated exposure control, iterative reconstruction, and/or weight based adjustment of the mA/kV was utilized to reduce the radiation dose to as low as reasonably achievable. Stenosis of the internal carotid arteries measured using NASCET criteria. COMPARISON: CT and MRI from earlier the same day. CLINICAL HISTORY: Transient ischemic attack (TIA); tia. Notes from triage: Pt arrived via stretcher from short stay. States speech is slurred, feeling unbalanced, feeling weak all over. Grip strengths equal, sensation equal. Woke like this. FINDINGS: CTA NECK: AORTIC ARCH AND ARCH VESSELS: No dissection or arterial injury. No significant stenosis of the brachiocephalic or subclavian arteries. CERVICAL CAROTID ARTERIES: No dissection, arterial injury, or hemodynamically significant stenosis by NASCET criteria. CERVICAL VERTEBRAL ARTERIES: No dissection, arterial injury, or significant stenosis. LUNGS AND MEDIASTINUM: 4 mm right upper lobe pulmonary nodule (series 11, image 23). SOFT TISSUES:  No acute abnormality. BONES: Bulky osteophytic spurring within the cervical spine, suggestive of DISH. Intermittent changes of OPLL noted. CTA HEAD: ANTERIOR CIRCULATION: Right A1 segment hypoplastic, atretic, or absent. No significant stenosis of the internal carotid arteries. No significant stenosis of the anterior cerebral arteries. No  significant stenosis of the middle cerebral arteries. No aneurysm. POSTERIOR CIRCULATION: Fetal type origin of the right PCA. No significant stenosis of the posterior cerebral arteries. No significant stenosis of the basilar artery. No significant stenosis of the vertebral arteries. No aneurysm. OTHER: No dural venous sinus thrombosis on this non-dedicated study. IMPRESSION: 1. Negative CTA for acute large vessel occlusion or other emergent finding. No hemodynamically significant or correctable stenosis. Electronically signed by: Morene Hoard MD 06/29/2024 10:33 PM EDT RP Workstation: [id number]   MR BRAIN WO CONTRAST Result Date: 06/29/2024 EXAM: MRI BRAIN WITHOUT CONTRAST 06/29/2024 02:46:52 PM TECHNIQUE: Multiplanar multisequence MRI of the head/brain was performed without the administration of intravenous contrast. COMPARISON: Same day CT head and MRI head 03/29/2021. CLINICAL HISTORY: Dysarthria, aphasia, states speech is slurred, feeling unbalanced, feeling weak all over. FINDINGS: BRAIN AND VENTRICLES: No acute infarct. No intracranial hemorrhage. No mass. No midline shift. No hydrocephalus. T2/FLAIR hyperintensity in the periventricular and subcortical white matter compatible with mild chronic microvascular ischemic changes. The sella is unremarkable. Normal flow voids. ORBITS: No acute abnormality. SINUSES AND MASTOIDS: Mucosal thickening in the right maxillary sinus. No acute abnormality in the mastoids. BONES AND SOFT TISSUES: Normal marrow signal. No acute soft tissue abnormality. IMPRESSION: 1. No acute intracranial abnormality. 2. Mild chronic microvascular ischemic changes. Electronically signed by: Donnice Mania MD 06/29/2024 04:19 PM EDT RP Workstation: [id number]   CT HEAD WO CONTRAST Result Date: 06/29/2024 EXAM: CT HEAD WITHOUT CONTRAST 06/29/2024 09:39:00 AM TECHNIQUE: CT of the head was performed without the administration of intravenous contrast. Automated exposure control,  iterative reconstruction, and/or weight based adjustment of the mA/kV was utilized to reduce the radiation dose to as low as reasonably achievable. COMPARISON: None available. CLINICAL HISTORY: Neuro deficit, acute, stroke suspected. FINDINGS: BRAIN AND VENTRICLES: No acute hemorrhage. No evidence of acute infarct. No hydrocephalus. No extra-axial collection. No mass effect or midline shift. ORBITS: No acute abnormality. SINUSES: No acute abnormality. SOFT TISSUES AND SKULL: No acute soft tissue abnormality. No skull fracture. IMPRESSION: 1. No evidence of  acute intracranial abnormality. Electronically signed by: Gilmore Molt MD 06/29/2024 09:48 AM EDT RP Workstation: [id number]    Pertinent labs & imaging results that were available during my care of the patient were reviewed by me and considered in my medical decision making (see MDM for details).  Medications Ordered in ED Medications  acetaminophen  (TYLENOL ) tablet 650 mg (has no administration in time range)    Or  acetaminophen  (TYLENOL ) suppository 650 mg (has no administration in time range)  melatonin tablet 3 mg (has no administration in time range)  ondansetron  (ZOFRAN ) injection 4 mg (has no administration in time range)  hydrALAZINE  (APRESOLINE ) injection 10 mg (has no administration in time range)  insulin  aspart (novoLOG ) injection 0-9 Units (has no administration in time range)  insulin  aspart (novoLOG ) injection 0-5 Units (has no administration in time range)  albuterol  (PROVENTIL ) (2.5 MG/3ML) 0.083% nebulizer solution 2.5 mg (has no administration in time range)  ARIPiprazole  (ABILIFY ) tablet 20 mg (has no administration in time range)  fluticasone  furoate-vilanterol (BREO ELLIPTA ) 100-25 MCG/ACT 1 puff (has no administration in time range)  gabapentin  (NEURONTIN ) capsule 300 mg (has no administration in time range)  nicotine  polacrilex (  NICORETTE) gum 2 mg (has no administration in time range)  nicotine  (NICODERM CQ  -  dosed in mg/24 hours) patch 14 mg (has no administration in time range)  oxyCODONE  (Oxy IR/ROXICODONE ) immediate release tablet 10 mg (10 mg Oral Given 06/29/24 1511)  iohexol  (OMNIPAQUE ) 350 MG/ML injection 75 mL (75 mLs Intravenous Contrast Given 06/29/24 2122)   stroke: early stages of recovery book ( Does not apply Given 06/30/24 0223)                                                                                                                                     Procedures .Critical Care  Performed by: Elnor Jayson LABOR, DO Authorized by: Elnor Jayson LABOR, DO   Critical care provider statement:    Critical care time (minutes):  30   Critical care time was exclusive of:  Separately billable procedures and treating other patients   Critical care was necessary to treat or prevent imminent or life-threatening deterioration of the following conditions:  CNS failure or compromise   Critical care was time spent personally by me on the following activities:  Development of treatment plan with patient or surrogate, discussions with consultants, evaluation of patient's response to treatment, examination of patient, ordering and review of laboratory studies, ordering and review of radiographic studies, ordering and performing treatments and interventions, pulse oximetry, re-evaluation of patient's condition, review of old charts and obtaining history from patient or surrogate   (including critical care time)  Medical Decision Making / ED Course    Medical Decision Making:    Lori Bean is a 53 y.o. female with past medical history as below, significant for diastolic heart failure, bipolar affective disorder, GAD, GERD, hepatic steatosis who presents to the ED with complaint of dysarthria. The complaint involves an extensive differential diagnosis and also carries with it a high risk of complications and morbidity.  Serious etiology was considered. Ddx includes but is not limited to: CVA,  TIA, hypoglycemia, intoxication, metabolic derangement, medication effect, ICH, etc.  Complete initial physical exam performed, notably the patient was in acute distress.    Reviewed and confirmed nursing documentation for past medical history, family history, social history.  Vital signs reviewed.    Dysarthria TIA> - Patient presents with dysarthria, last known normal was approximately 11:30 PM yesterday evening.  She woke up with the symptoms. - NIHSS 2, (nasolabial flattening, dysarthria) - She does not meet criteria for stroke alert activation given duration of symptoms, she is Fleeta negative - CT head and MRI brain without were negative.  Patient reports her symptoms are mildly improved, concern for possible TIA.  ABCD2 score is elevated.  Recommend observation - Spoke with neurology  Clinical Course as of 06/30/24 0730  Thu Jun 29, 2024  1145 Delay in MRI secondary to patient's spinal stimulator that is not charged.  MRI tech reports that we need to charge the spinal stimulator and  switch it to MRI mode before we can attempt the MRI. [SG]  1643 MRI resulted, no acute infarct  Patient's dysarthria is improving.  She still has slight nasolabial flattening.  ABCD2 score is a 6.  Will discuss with neurology [SG]  1726 Concern TIA given improvement to symptoms. Dr SALLYANN requesting CTA head/neck, if grossly abnormal would likely benefit from admit at [facility], o/w can stay here [SG]    Clinical Course User Index [SG] Elnor Jayson LABOR, DO                    Additional history obtained: -Additional history obtained from family -External records from outside source obtained and reviewed including: Chart review including previous notes, labs, imaging, consultation notes including  Allergy list, prior ED evaluation   Lab Tests: -I ordered, reviewed, and interpreted labs.   The pertinent results include:   Labs Reviewed  DIFFERENTIAL - Abnormal; Notable for the following  components:      Result Value   Eosinophils Absolute 0.6 (*)    All other components within normal limits  COMPREHENSIVE METABOLIC PANEL WITH GFR - Abnormal; Notable for the following components:   Glucose, Bld 102 (*)    AST 14 (*)    All other components within normal limits  URINALYSIS, ROUTINE W REFLEX MICROSCOPIC - Abnormal; Notable for the following components:   Color, Urine STRAW (*)    All other components within normal limits  I-STAT CHEM 8, ED - Abnormal; Notable for the following components:   Potassium 5.2 (*)    Calcium , Ion 1.14 (*)    All other components within normal limits  CBC  ETHANOL  URINE DRUG SCREEN  PROTIME-INR  APTT  MAGNESIUM   LIPID PANEL  CBC WITH DIFFERENTIAL/PLATELET  CBC WITH DIFFERENTIAL/PLATELET  COMPREHENSIVE METABOLIC PANEL WITH GFR  CBG MONITORING, ED  CBG MONITORING, ED    Notable for labs are stable  EKG   EKG Interpretation Date/Time:    Ventricular Rate:    PR Interval:    QRS Duration:    QT Interval:    QTC Calculation:   R Axis:      Text Interpretation:           Imaging Studies ordered: I ordered imaging studies including CT head, MRI brain I independently visualized the following imaging with scope of interpretation limited to determining acute life threatening conditions related to emergency care; findings noted above I agree with the radiologist interpretation If any imaging was obtained with contrast I closely monitored patient for any possible adverse reaction a/w contrast administration in the emergency department   Medicines ordered and prescription drug management: Meds ordered this encounter  Medications   oxyCODONE  (Oxy IR/ROXICODONE ) immediate release tablet 10 mg    Refill:  0   iohexol  (OMNIPAQUE ) 350 MG/ML injection 75 mL   OR Linked Order Group    acetaminophen  (TYLENOL ) tablet 650 mg    acetaminophen  (TYLENOL ) suppository 650 mg   melatonin tablet 3 mg   ondansetron  (ZOFRAN ) injection 4 mg     stroke: early stages of recovery book   hydrALAZINE  (APRESOLINE ) injection 10 mg   insulin  aspart (novoLOG ) injection 0-9 Units    Correction coverage::   Sensitive (thin, NPO, renal)    CBG < 70::   Implement Hypoglycemia Standing Orders and refer to Hypoglycemia Standing Orders sidebar report    CBG 70 - 120::   0 units    CBG 121 - 150::   1 unit  CBG 151 - 200::   2 units    CBG 201 - 250::   3 units    CBG 251 - 300::   5 units    CBG 301 - 350::   7 units    CBG 351 - 400:   9 units    CBG > 400:   call MD and obtain STAT lab verification   insulin  aspart (novoLOG ) injection 0-5 Units    Correction coverage::   HS scale    CBG < 70::   Implement Hypoglycemia Standing Orders and refer to Hypoglycemia Standing Orders sidebar report    CBG 70 - 120::   0 units    CBG 121 - 150::   0 units    CBG 151 - 200::   0 units    CBG 201 - 250::   2 units    CBG 251 - 300::   3 units    CBG 301 - 350::   4 units    CBG 351 - 400::   5 units    CBG > 400:   call MD and obtain STAT lab verification   albuterol  (PROVENTIL ) (2.5 MG/3ML) 0.083% nebulizer solution 2.5 mg   ARIPiprazole  (ABILIFY ) tablet 20 mg   fluticasone  furoate-vilanterol (BREO ELLIPTA ) 100-25 MCG/ACT 1 puff   gabapentin  (NEURONTIN ) capsule 300 mg   nicotine  polacrilex (NICORETTE) gum 2 mg   nicotine  (NICODERM CQ  - dosed in mg/24 hours) patch 14 mg    -I have reviewed the patients home medicines and have made adjustments as needed   Consultations Obtained: I requested consultation with the neurology, hospitalist,  and discussed lab and imaging findings as well as pertinent plan    Cardiac Monitoring: The patient was maintained on a cardiac monitor.  I personally viewed and interpreted the cardiac monitored which showed an underlying rhythm of: NSR Continuous pulse oximetry interpreted by myself, 97% on RA.    Social Determinants of Health:  Diagnosis or treatment significantly limited by social determinants of  health: obesity   Reevaluation: After the interventions noted above, I reevaluated the patient and found that they have improved  Co morbidities that complicate the patient evaluation  Past Medical History:  Diagnosis Date   Abdominal hernia    Asthma    Bipolar affective disorder (HCC)    Breast pain in female    09-23-2022  per pt right side due to previous chronic cough which resolved   Chronic diastolic CHF (congestive heart failure) (HCC) 2013   followed by dr t. raford   Chronic pain syndrome    COPD mixed type [facility])    pulmonologist--- dr jude;  pt not on any oxygen;   still smokes;    last exacerbation ED visit 11-23-2022 no admission but followed up w/ Dr jude 12-14-2022 treated;   prior to this admission w/ ARF 08-26-2022   Depression    follows w/ PCP   Dyspnea    GAD (generalized anxiety disorder)    GERD (gastroesophageal reflux disease)    Headache    occasional   Heart murmur    Hepatic steatosis    followed by GI w/ [facility] ;    liver bx 05-14-2022 mild active steatohepatitis, fibrosis stage 0-1, early stages primary biliary cholangitis   History of acute respiratory failure    multiple admission for  COPd/ asthma exacerbation's w/ failure (09-22-2022  last admission 08-26-2022)   History of DVT (deep vein thrombosis)    05-02-2012  RUL  thrombosis cephalic vein and 05-08-2012  LUE  superficial thrombus bachial vein PICC line site;  01-30-2014   LLE   History of substance abuse (HCC)    crack/ opiates/ cannibus  (08-23-2023  pt stated last used any type 2016)   Hyperlipidemia, mixed    Hypertension    coronary CT 04-01-2021   calcium  score=zero   Incontinence of feces with fecal urgency    s/p  stage one sacrum nerve stimulator  09/ 2023 by dr debby in office   Lymphedema of both lower extremities 06/07/2018   per pt wraps legs nightly   Morbid (severe) obesity due to excess calories (HCC)    Neuropathy    diabetic neuropathy and left leg post op  lumbar surgery   OA (osteoarthritis)    OSA treated with BiPAP    pulmonologist--- dr jude;   per pt uses nightly   Overactive bladder    Pneumonia    Schizophrenia (HCC)    Type 2 diabetes mellitus (HCC)    followed by pcp;   (08/23/23 -pt stated she checks BS tid, average fasting blood surgar of 90   Wears glasses       Dispostion: Disposition decision including need for hospitalization was considered, and patient admitted to the hospital.    Final Clinical Impression(s) / ED Diagnoses Final diagnoses:  Dysarthria  TIA (transient ischemic attack)        Elnor Jayson LABOR, DO 06/30/24 0730

## 2024-06-29 NOTE — Plan of Care (Signed)
 Discussed with [facility] EDP:  Patient with transient dysarthria when she woke up this morning. LKN was 1130pm (06/28/24). No longer with symptoms. MRI brain negative for acute infarct. Ddx: TIA.   Recommend obtaining CTA head/neck. If abnormal, recommend transfer to [facility] ED for evaluation by Stroke Team.  Normie Blower, MD [facility]

## 2024-06-29 NOTE — H&P (Addendum)
 Patient presented to preop with slurred speech and concern to TIA.  Stated these symptoms have been present since awakening this AM.  Will cancel her surgery.  She is being transferred to the ED for emergent evaluation.    Lori JAYSON Ned, MD  Colorectal and General Surgery [facility]

## 2024-06-29 NOTE — ED Triage Notes (Addendum)
 Pt arrived via stretcher from short stay. States speech is slurred, feeling unbalanced, feeling weak all over. Grip strengths equal, sensation equal. Woke like this.   LKN 2330 10/16

## 2024-06-29 NOTE — H&P (Addendum)
 History and Physical      Lori Bean FMW:[id number] DOB: 05-24-1971 DOA: 06/29/2024; DOS: 06/29/2024  PCP: Sebastian Jerilyn HERO, FNP  Patient coming from: home   I have personally briefly reviewed patient's old medical records in [facility]  Chief Complaint: slurred speech  HPI: Lori Bean is a 53 y.o. female with medical history significant for type 2 diabetes mellitus, essential hypertension, hyperlipidemia, COPD, depression, chronic pain syndrome, chronic tobacco abuse, who is admitted to [facility] on 06/29/2024 with suspected TIA after presenting from home to [facility] ED complaining of slurred speech.   The patient reports that she went to sleep around 2200 on 06/28/2024 in her normal state of health, without any acute symptoms at that time.  She subsidy awoke on the morning of 06/29/2024 with interval development of slurred speech as well as left-sided facial droop.  This has not been associate with any acute focal weakness nor any acute focal numbness, paresthesias.  Denies any associated difficulties, acute change in vision, vertigo, or dysphagia.  She also denies any recent chest pain or shortness of breath.   Her slurred speech and left-sided facial droop have improved, and are now resolved.   Medical history notable for type 2 diabetes mellitus, with most recent hemoglobin A1c noted to be 5.6% when checked on 06/16/2024, hypertension, hyperlipidemia, which is managed via lifestyle modifications in the absence of any outpatient statin or other lipid-lowering medications.  Additionally, she is a current cigarette smoker.     ED Course:  Vital signs in the ED were notable for the following: Afebrile; heart rates in the 70s to 80s; systolic blood pressures in the 150s 180; respiratory rate 17-27, oxygen saturation 94 to 100% on room air.  Labs were notable for the following: CMP notable for creatinine 0.67, glucose 102.  Serum ethanol level less than  15.  CBC notable for white blood cell count 9300, hemoglobin 14.4, platelet count 216.  Urinary drug screen is pan negative.  INR 0.9, PTT 24.  Per my interpretation, EKG in ED demonstrated the following: EKG shows sinus rhythm with first-degree AV block, with PR interval 210, heart rate 76, nonspecific T wave inversion in lead III, and no evidence of ST changes, including no evidence of STEMI.  Imaging in the ED, per corresponding formal radiology read, was notable for the following: Noncontrast CT head showed no evidence of acute intracranial process.  MRI brain showed no evidence of acute intracranial process, including no evidence of acute infarct.  CTA head and neck showed no evidence of LVO.  EDP d/w on-call neurology, Dr. Sallyann, who recommended hospitalist admission to [facility] for further TIA work-up, and conveyed that it was okay for pt to remain at [facility].    While in the ED, the following were administered: Oxycodone  IR 10 mg p.o. x 1 dose.  Subsequently, the patient was admitted for further evaluation management of suspected TIA.     Review of Systems: As per HPI otherwise 10 point review of systems negative.   Past Medical History:  Diagnosis Date   Abdominal hernia    Asthma    Bipolar affective disorder (HCC)    Breast pain in female    09-23-2022  per pt right side due to previous chronic cough which resolved   Chronic diastolic CHF (congestive heart failure) (HCC) 2013   followed by dr t. raford   Chronic pain syndrome    COPD mixed type [facility])    pulmonologist--- dr  alva;  pt not on any oxygen;   still smokes;    last exacerbation ED visit 11-23-2022 no admission but followed up w/ Dr Jude 12-14-2022 treated;   prior to this admission w/ ARF 08-26-2022   Depression    follows w/ PCP   Dyspnea    GAD (generalized anxiety disorder)    GERD (gastroesophageal reflux disease)    Headache    occasional   Heart murmur    Hepatic steatosis    followed by GI w/ [facility] ;    liver bx 05-14-2022 mild active steatohepatitis, fibrosis stage 0-1, early stages primary biliary cholangitis   History of acute respiratory failure    multiple admission for  COPd/ asthma exacerbation's w/ failure (09-22-2022  last admission 08-26-2022)   History of DVT (deep vein thrombosis)    05-02-2012  RUL thrombosis cephalic vein and 05-08-2012  LUE  superficial thrombus bachial vein PICC line site;  01-30-2014   LLE   History of substance abuse (HCC)    crack/ opiates/ cannibus  (08-23-2023  pt stated last used any type 2016)   Hyperlipidemia, mixed    Hypertension    coronary CT 04-01-2021   calcium  score=zero   Incontinence of feces with fecal urgency    s/p  stage one sacrum nerve stimulator  09/ 2023 by dr debby in office   Lymphedema of both lower extremities 06/07/2018   per pt wraps legs nightly   Morbid (severe) obesity due to excess calories (HCC)    Neuropathy    diabetic neuropathy and left leg post op lumbar surgery   OA (osteoarthritis)    OSA treated with BiPAP    pulmonologist--- dr jude;   per pt uses nightly   Overactive bladder    Pneumonia    Schizophrenia (HCC)    Type 2 diabetes mellitus (HCC)    followed by pcp;   (08/23/23 -pt stated she checks BS tid, average fasting blood surgar of 90   Wears glasses     Past Surgical History:  Procedure Laterality Date   CHOLECYSTECTOMY, LAPAROSCOPIC  11/09/2007   @[facility]  by dr ethyl   COLONOSCOPY WITH PROPOFOL  N/A 01/23/2016   Procedure: COLONOSCOPY WITH PROPOFOL ;  Surgeon: Lesta JULIANNA Fitz, MD;  Location: [facility] ENDOSCOPY;  Service: Endoscopy;  Laterality: N/A;   KNEE ARTHROSCOPY Left    KNEE ARTHROSCOPY WITH MENISCAL REPAIR Right 09/28/2016   Procedure: KNEE ARTHROSCOPY WITH MENISCAL REPAIR;  Surgeon: Norleen Gavel, MD;  Location: [facility] OR;  Service: Orthopedics;  Laterality: Right;  Right partial meniscectomy and chondroplasty, patellar/femoral joint and medial femoral condyle    LUMBAR LAMINECTOMY/DECOMPRESSION  MICRODISCECTOMY  04/01/2012   Procedure: LUMBAR LAMINECTOMY/DECOMPRESSION MICRODISCECTOMY 2 LEVELS;  Surgeon: Darina MALVA Boehringer, MD;  Location: [facility] NEURO ORS;  Service: Neurosurgery;  Laterality: Left;  Lumbar four-five, lumbar five sacral one microdiscectomy    LUMBAR WOUND DEBRIDEMENT  04/29/2012   Procedure: LUMBAR WOUND DEBRIDEMENT;  Surgeon: Darina MALVA Boehringer, MD;  Location: [facility] NEURO ORS;  Service: Neurosurgery;  Laterality: N/A;  lumbar wound debridement   ORIF ORBITAL FRACTURE Right 1989   per pt has retained hardware   POSTERIOR FUSION LUMBAR SPINE  12/28/2013   @[facility]  by dr boehringer;   bilateral L5-S1 laminectomy / discectomy/ decompression / fusion   ROTATOR CUFF REPAIR Right    yrs ago   SACRAL NERVE STIMULATOR IMPLANTATION  09/25/2022   [facility] - Dr. debby   TUBAL LIGATION Bilateral 1999   [facility]  Social History:  reports that she has been smoking cigarettes. She has a 12.5 pack-year smoking history. She has been exposed to tobacco smoke. She has never used smokeless tobacco. She reports that she does not currently use alcohol. She reports that she does not currently use drugs after having used the following drugs: Cocaine, Crack cocaine, and Marijuana.   Allergies  Allergen Reactions   Aspirin  Nausea And Vomiting    GI upset    Family History  Problem Relation Age of Onset   Diabetes Mother    Hypertension Mother    Diabetes Father    Heart disease Paternal Aunt    Cancer Paternal Aunt    Esophageal cancer Neg Hx    Stomach cancer Neg Hx     Family history reviewed and not pertinent    Prior to Admission medications   Medication Sig Start Date End Date Taking? Authorizing Provider  albuterol  (PROVENTIL ) (2.5 MG/3ML) 0.083% nebulizer solution Take 2.5 mg by nebulization every 6 (six) hours as needed for wheezing or shortness of breath. 08/12/22   [provider]  albuterol  (VENTOLIN  HFA) 108 (90 Base) MCG/ACT inhaler Inhale 1-2 puffs into  the lungs every 6 (six) hours as needed for wheezing or shortness of breath. 01/31/24   Jude Harden GAILS, MD  amLODipine  (NORVASC ) 5 MG tablet Take 5 mg by mouth daily. 12/30/23   [provider]  amoxicillin -clavulanate (AUGMENTIN ) 875-125 MG tablet Take 1 tablet by mouth every 12 (twelve) hours. Patient not taking: Reported on 06/14/2024 06/06/24   Hildegard Loge, PA-C  ARIPiprazole  (ABILIFY ) 20 MG tablet Take 1 tablet (20 mg total) by mouth daily. 06/02/23     atorvastatin  (LIPITOR ) 40 MG tablet Take 1 tablet (40 mg total) by mouth daily. 06/26/22   Vannie Reche RAMAN, NP  budesonide -formoterol  (SYMBICORT ) 160-4.5 MCG/ACT inhaler Inhale 1 puff into the lungs 2 (two) times daily. 10/19/23     carvedilol  (COREG ) 12.5 MG tablet Take 1 tablet (12.5 mg total) by mouth 2 (two) times daily. 06/22/23 06/14/24  Swinyer, Rosaline HERO, NP  DULOXETINE  HCL PO Take by mouth 2 (two) times daily.    [provider]  furosemide  (LASIX ) 40 MG tablet Take 1 tablet (40 mg total) by mouth daily. Please take 40mg  twice daily x2 days, then 40mg  daily Patient taking differently: Take 80 mg by mouth daily. 10/05/22 06/14/24  Raford Riggs, MD  gabapentin  (NEURONTIN ) 300 MG capsule Take 300 mg by mouth 3 (three) times daily. 02/02/19   [provider]  hydrOXYzine  (ATARAX ) 25 MG tablet Take 25 mg by mouth 3 (three) times daily. 12/09/22   [provider]  lidocaine  (LIDODERM ) 5 % Place 1 patch onto the skin daily. Remove & Discard patch within 12 hours or as directed by MD Patient not taking: Reported on 06/14/2024 06/06/24   Hildegard Loge, PA-C  methocarbamol  (ROBAXIN ) 500 MG tablet Take 1 tablet (500 mg total) by mouth 3 (three) times daily as needed for muscle spasms. 02/17/24     metoprolol  succinate (TOPROL -XL) 50 MG 24 hr tablet Take 50 mg by mouth daily. Take with or immediately following a meal.    [provider]  Multiple Vitamins-Minerals (MULTIVITAMIN WITH MINERALS) tablet Take 1 tablet by  mouth daily. Woman    [provider]  naloxone  (NARCAN ) nasal spray 4 mg/0.1 mL Use 1 spray every 2 minutes as needed for opioid overdose; spray 1 dose into ONE nostril; alternate nostrils with each dose until help arrives 04/17/24  omeprazole  (PRILOSEC) 20 MG capsule Take 20 mg by mouth daily. 10/23/23   [provider]  Oxycodone  HCl 10 MG TABS Take 1 tablet (10 mg total) by mouth 6 (six) times daily as needed for pain. 06/15/24     Semaglutide , 1 MG/DOSE, (OZEMPIC , 1 MG/DOSE,) 4 MG/3ML SOPN Inject 1 mg into the skin once a week. Patient taking differently: Inject 2 mg into the skin once a week. 10/19/23     triamterene -hydrochlorothiazide  (DYAZIDE ) 37.5-25 MG capsule Take 1 capsule by mouth daily.    [provider]  valsartan  (DIOVAN ) 80 MG tablet TAKE 1 TABLET(80 MG) BY MOUTH DAILY 12/09/23   Raford Riggs, MD  Vitamin D , Ergocalciferol , (DRISDOL ) 1.25 MG (50000 UNIT) CAPS capsule Take 1 capsule (50,000 Units total) by mouth once a week. 06/02/24     Vitamin D , Ergocalciferol , (DRISDOL ) 1.25 MG (50000 UNIT) CAPS capsule Take 1 capsule (50,000 Units total) by mouth once a week. 06/15/24        Objective    Physical Exam: Vitals:   06/29/24 1530 06/29/24 1700 06/29/24 1800 06/29/24 2204  BP:  (!) 165/105 (!) 146/85 (!) 159/109  Pulse:  61 69 65  Resp:  17 17 17   Temp:   98.3 F (36.8 C) 98.5 F (36.9 C)  TempSrc:   Oral Oral  SpO2: 100% 97% 97% 96%    General: appears to be stated age; alert, oriented Skin: warm, dry, no rash Head:  AT/[place] Mouth:  Oral mucosa membranes appear moist, normal dentition Neck: supple; trachea midline Heart:  RRR; did not appreciate any M/R/G Lungs: CTAB, did not appreciate any wheezes, rales, or rhonchi Abdomen: + BS; soft, ND, NT Vascular: 2+ pedal pulses b/l; 2+ radial pulses b/l Extremities: no peripheral edema, no muscle wasting Neuro: strength and sensation intact in upper and lower extremities b/l     Labs on  Admission: I have personally reviewed following labs and imaging studies  CBC: Recent Labs  Lab 06/29/24 0855 06/29/24 1028  WBC 9.3  --   NEUTROABS 5.4  --   HGB 14.4 14.6  HCT 45.7 43.0  MCV 97.4  --   PLT 216  --    Basic Metabolic Panel: Recent Labs  Lab 06/29/24 0855 06/29/24 1028  NA 137 141  K 4.7 5.2*  CL 104 108  CO2 23  --   GLUCOSE 102* 92  BUN 16 19  CREATININE 0.67 0.60  CALCIUM  10.1  --    GFR: Estimated Creatinine Clearance: 88.6 mL/min (by C-G formula based on SCr of 0.6 mg/dL). Liver Function Tests: Recent Labs  Lab 06/29/24 0855  AST 14*  ALT 12  ALKPHOS 82  BILITOT 0.4  PROT 7.6  ALBUMIN 4.1   No results for input(s): LIPASE, AMYLASE in the last 168 hours. No results for input(s): AMMONIA in the last 168 hours. Coagulation Profile: Recent Labs  Lab 06/29/24 1000  INR 0.9   Cardiac Enzymes: No results for input(s): CKTOTAL, CKMB, CKMBINDEX, TROPONINI in the last 168 hours. BNP (last 3 results) No results for input(s): PROBNP in the last 8760 hours. HbA1C: No results for input(s): HGBA1C in the last 72 hours. CBG: Recent Labs  Lab 06/29/24 0833 06/29/24 1016 06/29/24 1332  GLUCAP 107* 71 78   Lipid Profile: No results for input(s): CHOL, HDL, LDLCALC, TRIG, CHOLHDL, LDLDIRECT in the last 72 hours. Thyroid  Function Tests: No results for input(s): TSH, T4TOTAL, FREET4, T3FREE, THYROIDAB in the last 72 hours. Anemia Panel: No results  for input(s): VITAMINB12, FOLATE, FERRITIN, TIBC, IRON, RETICCTPCT in the last 72 hours. Urine analysis:    Component Value Date/Time   COLORURINE STRAW (A) 06/29/2024 0907   APPEARANCEUR CLEAR 06/29/2024 0907   LABSPEC 1.006 06/29/2024 0907   PHURINE 6.0 06/29/2024 0907   GLUCOSEU NEGATIVE 06/29/2024 0907   HGBUR NEGATIVE 06/29/2024 0907   BILIRUBINUR NEGATIVE 06/29/2024 0907   BILIRUBINUR neg 06/19/2015 1142   KETONESUR NEGATIVE 06/29/2024  0907   PROTEINUR NEGATIVE 06/29/2024 0907   UROBILINOGEN 0.2 06/19/2015 1142   UROBILINOGEN 0.2 08/24/2013 1416   NITRITE NEGATIVE 06/29/2024 0907   LEUKOCYTESUR NEGATIVE 06/29/2024 9092    Radiological Exams on Admission: CT ANGIO HEAD NECK W WO CM Result Date: 06/29/2024 EXAM: CTA HEAD AND NECK WITH AND WITHOUT 06/29/2024 09:38:23 PM TECHNIQUE: CTA of the head and neck was performed with and without the administration of 75 mL of iohexol  (OMNIPAQUE ) 350 MG/ML injection. Multiplanar 2D and/or 3D reformatted images are provided for review. Automated exposure control, iterative reconstruction, and/or weight based adjustment of the mA/kV was utilized to reduce the radiation dose to as low as reasonably achievable. Stenosis of the internal carotid arteries measured using NASCET criteria. COMPARISON: CT and MRI from earlier the same day. CLINICAL HISTORY: Transient ischemic attack (TIA); tia. Notes from triage: Pt arrived via stretcher from short stay. States speech is slurred, feeling unbalanced, feeling weak all over. Grip strengths equal, sensation equal. Woke like this. FINDINGS: CTA NECK: AORTIC ARCH AND ARCH VESSELS: No dissection or arterial injury. No significant stenosis of the brachiocephalic or subclavian arteries. CERVICAL CAROTID ARTERIES: No dissection, arterial injury, or hemodynamically significant stenosis by NASCET criteria. CERVICAL VERTEBRAL ARTERIES: No dissection, arterial injury, or significant stenosis. LUNGS AND MEDIASTINUM: 4 mm right upper lobe pulmonary nodule (series 11, image 23). SOFT TISSUES: No acute abnormality. BONES: Bulky osteophytic spurring within the cervical spine, suggestive of DISH. Intermittent changes of OPLL noted. CTA HEAD: ANTERIOR CIRCULATION: Right A1 segment hypoplastic, atretic, or absent. No significant stenosis of the internal carotid arteries. No significant stenosis of the anterior cerebral arteries. No significant stenosis of the middle cerebral  arteries. No aneurysm. POSTERIOR CIRCULATION: Fetal type origin of the right PCA. No significant stenosis of the posterior cerebral arteries. No significant stenosis of the basilar artery. No significant stenosis of the vertebral arteries. No aneurysm. OTHER: No dural venous sinus thrombosis on this non-dedicated study. IMPRESSION: 1. Negative CTA for acute large vessel occlusion or other emergent finding. No hemodynamically significant or correctable stenosis. Electronically signed by: Morene Hoard MD 06/29/2024 10:33 PM EDT RP Workstation: [id number]   MR BRAIN WO CONTRAST Result Date: 06/29/2024 EXAM: MRI BRAIN WITHOUT CONTRAST 06/29/2024 02:46:52 PM TECHNIQUE: Multiplanar multisequence MRI of the head/brain was performed without the administration of intravenous contrast. COMPARISON: Same day CT head and MRI head 03/29/2021. CLINICAL HISTORY: Dysarthria, aphasia, states speech is slurred, feeling unbalanced, feeling weak all over. FINDINGS: BRAIN AND VENTRICLES: No acute infarct. No intracranial hemorrhage. No mass. No midline shift. No hydrocephalus. T2/FLAIR hyperintensity in the periventricular and subcortical white matter compatible with mild chronic microvascular ischemic changes. The sella is unremarkable. Normal flow voids. ORBITS: No acute abnormality. SINUSES AND MASTOIDS: Mucosal thickening in the right maxillary sinus. No acute abnormality in the mastoids. BONES AND SOFT TISSUES: Normal marrow signal. No acute soft tissue abnormality. IMPRESSION: 1. No acute intracranial abnormality. 2. Mild chronic microvascular ischemic changes. Electronically signed by: Donnice Mania MD 06/29/2024 04:19 PM EDT RP Workstation: [id number]   CT HEAD WO CONTRAST Result  Date: 06/29/2024 EXAM: CT HEAD WITHOUT CONTRAST 06/29/2024 09:39:00 AM TECHNIQUE: CT of the head was performed without the administration of intravenous contrast. Automated exposure control, iterative reconstruction, and/or weight based  adjustment of the mA/kV was utilized to reduce the radiation dose to as low as reasonably achievable. COMPARISON: None available. CLINICAL HISTORY: Neuro deficit, acute, stroke suspected. FINDINGS: BRAIN AND VENTRICLES: No acute hemorrhage. No evidence of acute infarct. No hydrocephalus. No extra-axial collection. No mass effect or midline shift. ORBITS: No acute abnormality. SINUSES: No acute abnormality. SOFT TISSUES AND SKULL: No acute soft tissue abnormality. No skull fracture. IMPRESSION: 1. No evidence of  acute intracranial abnormality. Electronically signed by: Gilmore Molt MD 06/29/2024 09:48 AM EDT RP Workstation: [id number]      Assessment/Plan    Principal Problem:   TIA (transient ischemic attack) Active Problems:   DM2 (diabetes mellitus, type 2) (HCC)   Dysarthria   HLD (hyperlipidemia)   History of essential hypertension   History of COPD   Depression   Tobacco abuse      #) TIA: suspected dx on the basis of new onset dysarthria as well as left-sided facial droop spontaneously resolved, with last known well leading up to this resolution noted to be 2200 on 06/28/2024.  Acute focal neurologic deficits noted at this time.  CT head brain showed no evidence of acute process, including no evidence of acute infarct, We will CTA head and neck showed no evidence of large vessel occlusion.  EDP d/w on-call neurology, Dr. Sallyann, who recommended hospitalist admission to [facility] for further TIA work-up, and conveyed that it was okay for pt to remain at [facility].   Will pursue further evaluation of modifiable ischemic CVA risk factors, as below.   EDP discussed patient's case and imaging with the on-call neurologist, Dr.  Johnanna, who recommended admission to the hospitalist service for further evaluation/management of concern for TIA versus acute ischemic CVA, including pursuit of MRI brain, as well as further assessment of potential modifiable ischemic CVA risk factors.  Neurology to  formally consult, with additional recommendations to follow.  As multiple known to include send hypertension, hyperlipidemia, type 2 diabetes mellitus cigarette smoker.  Blood thinners as an outpatient.  Not a candidate for TPA administration given that improvement with subsequent resolution of her facial droop.  Suspected TIA with initial last known well of 2200 on 06/28/2024, will pursue brief period of progressive hypertension as below.    Plan: Per neurology, will admit to [facility] , as above. nursing bedside swallow evaluation x 1 now, and will not initiate oral medications or diet until the patient has passed this. Head of the bed at 30 degrees. Neuro checks per protocol. VS per protocol. Will allow for permissive hypertension for 24-48 hours during which will hold home antihypertensive medications, with prn IV hydralazine  ordered for SBP greater than 220 mmHg or DBP greater than 110 mmHg relative to  LKW of 2200 on 10/15.  Monitor on telemetry, including monitoring for atrial fibrillation as modifiable risk factor for acute ischemic CVA.   TTE without  bubble study has been ordered for the morning. Additionally, as component of evaluation of potential modifiable ischemic CVA risk factors, will also check lipid panel.  It is noted that the patient underwent hemoglobin A1c check on 06/16/2024.  Consequently, will not repeat hemoglobin A1c at this time.  PT/OT consults have been ordered for the morning. Additionally, as a standard component of work-up for acute ischemic cva, I've also ordered a  speech therapy consult for the morning.  Counseled the patient on the importance and discontinuation of cigarette smoking, I have placed orders for prn nicotine  patch as well as prn Nicorette gum for use during this hospitalization.                     #) Type 2 Diabetes Mellitus: documented history of such. Home insulin  regimen: None. Home oral hypoglycemic agents: None.  She is on Ozempic  as  an outpatient.  Her type 2 diabetes mellitus is complicated by history of peripheral polyneuropathy for which she is on gabapentin  at home.  Most recent hemoglobin A1c was found to be 5.6% when checked on 06/16/2024.  Present blood sugar 102.    Plan: accuchecks QAC and HS with low dose SSI.  Resume outpatient gabapentin .                    #) COPD: Documented history thereof, without clinical evidence of acute exacerbation at this time.   Outpatient respiratory regimen includes the following: Scheduled Symbicort , as needed albuterol  inhaler.   Plan: cont outpatient Symbicort . Prn albuterol  nebulizer. Check CMP and serum magnesium  level in the AM.  Counseled the patient on importance of smoking elimination as further detailed above.                     #) Essential Hypertension: documented h/o such, with outpatient antihypertensive regimen including tiamterene, HCTZ, valsartan , Lasix , amlodipine , Coreg  SBP's in the ED today: 150s to 180s mmHg. will observe permissive hypertension in the setting of presenting DKA, as above.  Plan: Close monitoring of subsequent BP via routine VS. holding home antihypertensive medications for now in the setting of brief period of permissive hypertension, as above.  As needed IV hydralazine  for systolic blood pressure greater than 220 mmHg during this period of permissive hypertension.  Monitor strict I's and O's and daily weights.  Monitor on telemetry.of                        #) Hyperlipidemia: documented h/o such.  Managed via lifestyle modifications as an outpatient, in the absence of any outpatient use of statin.  Plan: Follow-up for results of lipid panel, as above.                   #) Depression: documented h/o such. On Abilify  as outpatient.    Plan: Continue outpatient Abilify .          DVT prophylaxis: SCD's   Code Status: Full code Family Communication: none Disposition  Plan: Per Rounding Team Consults called: EDP d/w on-call neurology, Dr. Sallyann, who recommended hospitalist admission to [facility] for further TIA work-up, and conveyed that it was okay for pt to remain at [facility].  ;  Admission status: obs    I SPENT GREATER THAN 75  MINUTES IN CLINICAL CARE TIME/MEDICAL DECISION-MAKING IN COMPLETING THIS ADMISSION.     Eva NOVAK Timm Bonenberger DO [facility] From 7PM - 7AM   06/29/2024, 11:31 PM

## 2024-06-30 ENCOUNTER — Observation Stay (HOSPITAL_COMMUNITY)

## 2024-06-30 ENCOUNTER — Encounter (HOSPITAL_COMMUNITY): Payer: Self-pay | Admitting: Internal Medicine

## 2024-06-30 DIAGNOSIS — Z8679 Personal history of other diseases of the circulatory system: Secondary | ICD-10-CM

## 2024-06-30 DIAGNOSIS — F32A Depression, unspecified: Secondary | ICD-10-CM | POA: Diagnosis present

## 2024-06-30 DIAGNOSIS — Z8709 Personal history of other diseases of the respiratory system: Secondary | ICD-10-CM

## 2024-06-30 DIAGNOSIS — G459 Transient cerebral ischemic attack, unspecified: Secondary | ICD-10-CM | POA: Diagnosis present

## 2024-06-30 DIAGNOSIS — Z720 Tobacco use: Secondary | ICD-10-CM | POA: Diagnosis present

## 2024-06-30 DIAGNOSIS — E785 Hyperlipidemia, unspecified: Secondary | ICD-10-CM | POA: Diagnosis present

## 2024-06-30 LAB — COMPREHENSIVE METABOLIC PANEL WITH GFR
ALT: 19 U/L (ref 0–44)
AST: 14 U/L — ABNORMAL LOW (ref 15–41)
Albumin: 4 g/dL (ref 3.5–5.0)
Alkaline Phosphatase: 89 U/L (ref 38–126)
Anion gap: 10 (ref 5–15)
BUN: 16 mg/dL (ref 6–20)
CO2: 27 mmol/L (ref 22–32)
Calcium: 9.9 mg/dL (ref 8.9–10.3)
Chloride: 101 mmol/L (ref 98–111)
Creatinine, Ser: 0.6 mg/dL (ref 0.44–1.00)
GFR, Estimated: >60 mL/min (ref >60)
Glucose, Bld: 96 mg/dL (ref 70–99)
Potassium: 4.1 mmol/L (ref 3.5–5.1)
Sodium: 138 mmol/L (ref 135–145)
Total Bilirubin: 0.4 mg/dL (ref 0.0–1.2)
Total Protein: 7.3 g/dL (ref 6.5–8.1)

## 2024-06-30 LAB — LIPID PANEL
Cholesterol: 148 mg/dL (ref 0–200)
HDL: 58 mg/dL (ref >40)
LDL Cholesterol: 65 mg/dL (ref 0–99)
Total CHOL/HDL Ratio: 2.5 ratio
Triglycerides: 124 mg/dL (ref <150)
VLDL: 25 mg/dL (ref 0–40)

## 2024-06-30 LAB — MAGNESIUM: Magnesium: 2.1 mg/dL (ref 1.7–2.4)

## 2024-06-30 LAB — CBG MONITORING, ED
Glucose-Capillary: 83 mg/dL (ref 70–99)
Glucose-Capillary: 123 mg/dL — ABNORMAL HIGH (ref 70–99)

## 2024-06-30 MED ORDER — HYDRALAZINE HCL 20 MG/ML IJ SOLN: 10.0000 mg | INTRAMUSCULAR | Status: DC | PRN

## 2024-06-30 MED ORDER — INSULIN ASPART 100 UNIT/ML IJ SOLN
0.0000 [IU] | Freq: Every day | INTRAMUSCULAR | Status: DC
  Filled 2024-06-30: qty 0.05

## 2024-06-30 MED ORDER — CLOPIDOGREL BISULFATE 300 MG PO TABS
300.0000 mg | ORAL_TABLET | Freq: Once | ORAL | Status: AC
  Administered 2024-06-30: 300 mg via ORAL
  Filled 2024-06-30: qty 1

## 2024-06-30 MED ORDER — CLOPIDOGREL BISULFATE 75 MG PO TABS: 75.0000 mg | ORAL_TABLET | Freq: Every day | ORAL | Status: DC

## 2024-06-30 MED ORDER — ATORVASTATIN CALCIUM 40 MG PO TABS
80.0000 mg | ORAL_TABLET | Freq: Every day | ORAL | Status: DC
  Administered 2024-06-30: 80 mg via ORAL
  Filled 2024-06-30: qty 2

## 2024-06-30 MED ORDER — FUROSEMIDE 40 MG PO TABS
40.0000 mg | ORAL_TABLET | Freq: Two times a day (BID) | ORAL | Status: DC
  Administered 2024-06-30: 40 mg via ORAL

## 2024-06-30 MED ORDER — ACETAMINOPHEN 650 MG RE SUPP: 650.0000 mg | Freq: Four times a day (QID) | RECTAL | Status: DC | PRN

## 2024-06-30 MED ORDER — ONDANSETRON HCL 4 MG/2ML IJ SOLN: 4.0000 mg | Freq: Four times a day (QID) | INTRAMUSCULAR | Status: DC | PRN

## 2024-06-30 MED ORDER — IRBESARTAN 75 MG PO TABS
75.0000 mg | ORAL_TABLET | Freq: Every day | ORAL | Status: DC
  Administered 2024-06-30: 75 mg via ORAL
  Filled 2024-06-30: qty 1

## 2024-06-30 MED ORDER — STROKE: EARLY STAGES OF RECOVERY BOOK
Freq: Once | Status: AC
  Filled 2024-06-30: qty 1

## 2024-06-30 MED ORDER — ATORVASTATIN CALCIUM 80 MG PO TABS: 80.0000 mg | ORAL_TABLET | Freq: Every day | ORAL | 2 refills | Status: AC

## 2024-06-30 MED ORDER — NICOTINE POLACRILEX 2 MG MT GUM: 2.0000 mg | CHEWING_GUM | OROMUCOSAL | Status: DC | PRN

## 2024-06-30 MED ORDER — CARVEDILOL 12.5 MG PO TABS
12.5000 mg | ORAL_TABLET | Freq: Two times a day (BID) | ORAL | Status: DC
  Administered 2024-06-30: 12.5 mg via ORAL
  Filled 2024-06-30: qty 1

## 2024-06-30 MED ORDER — MELATONIN 3 MG PO TABS: 3.0000 mg | ORAL_TABLET | Freq: Every evening | ORAL | Status: DC | PRN

## 2024-06-30 MED ORDER — ASPIRIN 81 MG PO TBEC: 81.0000 mg | DELAYED_RELEASE_TABLET | Freq: Every day | ORAL | 0 refills | Status: AC

## 2024-06-30 MED ORDER — ALBUTEROL SULFATE (2.5 MG/3ML) 0.083% IN NEBU: 2.5000 mg | INHALATION_SOLUTION | RESPIRATORY_TRACT | Status: DC | PRN

## 2024-06-30 MED ORDER — FLUTICASONE FUROATE-VILANTEROL 100-25 MCG/ACT IN AEPB: 1.0000 | INHALATION_SPRAY | Freq: Every day | RESPIRATORY_TRACT | Status: DC

## 2024-06-30 MED ORDER — INSULIN ASPART 100 UNIT/ML IJ SOLN
0.0000 [IU] | Freq: Three times a day (TID) | INTRAMUSCULAR | Status: DC
  Administered 2024-06-30: 1 [IU] via SUBCUTANEOUS
  Filled 2024-06-30: qty 0.09

## 2024-06-30 MED ORDER — AMLODIPINE BESYLATE 5 MG PO TABS
5.0000 mg | ORAL_TABLET | Freq: Every day | ORAL | Status: DC
  Administered 2024-06-30: 5 mg via ORAL
  Filled 2024-06-30: qty 1

## 2024-06-30 MED ORDER — ATORVASTATIN CALCIUM 40 MG PO TABS
40.0000 mg | ORAL_TABLET | Freq: Every day | ORAL | Status: DC
  Filled 2024-06-30: qty 1

## 2024-06-30 MED ORDER — FUROSEMIDE 40 MG PO TABS
40.0000 mg | ORAL_TABLET | Freq: Every day | ORAL | Status: DC
  Filled 2024-06-30: qty 1

## 2024-06-30 MED ORDER — ASPIRIN 81 MG PO TBEC
81.0000 mg | DELAYED_RELEASE_TABLET | Freq: Every day | ORAL | Status: AC
  Administered 2024-06-30: 81 mg via ORAL
  Filled 2024-06-30: qty 1

## 2024-06-30 MED ORDER — ACETAMINOPHEN 325 MG PO TABS: 650.0000 mg | ORAL_TABLET | Freq: Four times a day (QID) | ORAL | Status: DC | PRN

## 2024-06-30 MED ORDER — GABAPENTIN 300 MG PO CAPS
300.0000 mg | ORAL_CAPSULE | Freq: Three times a day (TID) | ORAL | Status: DC
  Administered 2024-06-30: 300 mg via ORAL
  Filled 2024-06-30: qty 1

## 2024-06-30 MED ORDER — NICOTINE 14 MG/24HR TD PT24: 14.0000 mg | MEDICATED_PATCH | Freq: Every day | TRANSDERMAL | Status: DC | PRN

## 2024-06-30 MED ORDER — CLOPIDOGREL BISULFATE 75 MG PO TABS: 75.0000 mg | ORAL_TABLET | Freq: Every day | ORAL | 0 refills | Status: AC

## 2024-06-30 MED ORDER — ARIPIPRAZOLE 5 MG PO TABS
20.0000 mg | ORAL_TABLET | Freq: Every day | ORAL | Status: DC
  Administered 2024-06-30: 20 mg via ORAL
  Filled 2024-06-30: qty 4

## 2024-06-30 NOTE — Consult Note (Signed)
 The patient is a 53 year old female with a past medical history of type 2 diabetes, essential hypertension, hyperlipidemia, COPD, depression, chronic pain syndrome, and chronic tobacco use, admitted on 10/16 with suspected TIA following onset of slurred speech and left-sided facial droop.  A psychiatry consult was requested for reported auditory-visual hallucinations and bipolar disorder. Chart review indicates no current evidence of AVH or mood symptoms per ED, neurology, or hospitalist documentation. The attending physician confirmed no acute psychiatric concerns and no clear indication for inpatient psychiatric evaluation at this time.  The patient is on Abilify , which has been resumed per her home regimen for depression. No additional psychotropic interventions are warranted during this medical admission.  Plan: No acute psychiatric intervention required at this time. Continue Abilify  as ordered. Psychiatry consult discontinued after discussion with Dr. Lue. Re-consult as needed for new or worsening psychiatric symptoms.

## 2024-06-30 NOTE — Discharge Summary (Signed)
 Physician Discharge Summary  Lori Bean FMW:[id number] DOB: 07/19/1971 DOA: 06/29/2024  PCP: Sebastian Jerilyn HERO, FNP  Admit date: 06/29/2024 Discharge date: 06/30/2024  Admitted From: Home Disposition: Home  Recommendations for Outpatient Follow-up:  Follow up with PCP in 1-2 weeks  Home Health: None Equipment/Devices: None  Discharge Condition: Stable CODE STATUS: Full Diet recommendation: Low-salt low-fat low-carb diet  Brief/Interim Summary: Lori Bean is a 53 y.o. female with medical history significant for type 2 diabetes mellitus, essential hypertension, hyperlipidemia, COPD, depression, chronic pain syndrome, chronic tobacco abuse, who presents to our facility with acute onset slurred speech on 06/29/2024.  Patient admitted as above, teleneurology following along recommending MRI CTA of the head.  Fortunately these images came back without acute findings, patient's transient episode of slurred speech appears to have resolved consistent with a TIA.  Given patient's risk factors and episode medication changes as outlined below including aspirin  and Plavix for 20 days at which time Plavix will be discontinued.  Statin regimen increased to 80 mg atorvastatin .  Of note patient reports distant history of aspirin  allergy with nausea vomiting, she tolerated 81 mg aspirin  here without any incident or complication.  Otherwise discussed lifestyle and dietary modification to reduce risk of recurrent episodes/strokes.  She is otherwise stable and agreeable for discharge home, family at bedside agree now that she is return to baseline with no further episodes or issues.  Discharge Diagnoses:  Principal Problem:   TIA (transient ischemic attack) Active Problems:   DM2 (diabetes mellitus, type 2) (HCC)   Dysarthria   HLD (hyperlipidemia)   History of essential hypertension   History of COPD   Depression   Tobacco abuse   TIA, CVA ruled out Type 2 diabetes,  non-insulin -dependent, well-controlled without hypoglycemia -resume home regimen COPD without exacerbation -continue home regimen Hypertension, well-controlled -continue home regimen Hyperlipidemia -increase atorvastatin  80 mg as above Depression -continue home regimen  Discharge Instructions  Discharge Instructions     Call MD for:  difficulty breathing, headache or visual disturbances   Complete by: As directed    Call MD for:  extreme fatigue   Complete by: As directed    Call MD for:  hives   Complete by: As directed    Call MD for:  persistant dizziness or light-headedness   Complete by: As directed    Call MD for:  persistant nausea and vomiting   Complete by: As directed    Call MD for:  severe uncontrolled pain   Complete by: As directed    Call MD for:  temperature >100.4   Complete by: As directed    Diet - low sodium heart healthy   Complete by: As directed    Discharge instructions   Complete by: As directed    Follow-up with PCP as discussed to continue workup for source for TIA.  Continue medications as discussed, Plavix for 20 days and aspirin  81 mg ongoing, also continue increased cholesterol medicine.  Continue to increase activity as discussed as well as focus on improved diet.   Increase activity slowly   Complete by: As directed       Allergies as of 06/30/2024       Reactions   Aspirin  Nausea And Vomiting   GI upset        Medication List     STOP taking these medications    Oxycodone  HCl 10 MG Tabs       TAKE these medications    albuterol  (2.5 MG/3ML) 0.083%  nebulizer solution Commonly known as: PROVENTIL  Take 2.5 mg by nebulization every 6 (six) hours as needed for wheezing or shortness of breath.   albuterol  108 (90 Base) MCG/ACT inhaler Commonly known as: VENTOLIN  HFA Inhale 1-2 puffs into the lungs every 6 (six) hours as needed for wheezing or shortness of breath.   amLODipine  5 MG tablet Commonly known as: NORVASC  Take 5 mg  by mouth daily.   ARIPiprazole  20 MG tablet Commonly known as: ABILIFY  Take 1 tablet (20 mg total) by mouth daily.   aspirin  EC 81 MG tablet Take 1 tablet (81 mg total) by mouth daily. Swallow whole.   atorvastatin  80 MG tablet Commonly known as: LIPITOR  Take 1 tablet (80 mg total) by mouth daily. Start taking on: July 01, 2024 What changed:  medication strength how much to take   carvedilol  12.5 MG tablet Commonly known as: COREG  Take 1 tablet (12.5 mg total) by mouth 2 (two) times daily.   clopidogrel 75 MG tablet Commonly known as: PLAVIX Take 1 tablet (75 mg total) by mouth daily. Start taking on: July 01, 2024   furosemide  40 MG tablet Commonly known as: LASIX  Take 1 tablet (40 mg total) by mouth daily. Please take 40mg  twice daily x2 days, then 40mg  daily What changed:  how much to take additional instructions   gabapentin  300 MG capsule Commonly known as: NEURONTIN  Take 300 mg by mouth 3 (three) times daily.   hydrOXYzine  25 MG tablet Commonly known as: ATARAX  Take 25 mg by mouth 3 (three) times daily.   methocarbamol  500 MG tablet Commonly known as: ROBAXIN  Take 1 tablet (500 mg total) by mouth 3 (three) times daily as needed for muscle spasms.   multivitamin with minerals tablet Take 1 tablet by mouth daily. Woman   naloxone  4 MG/0.1ML Liqd nasal spray kit Commonly known as: Narcan  Use 1 spray every 2 minutes as needed for opioid overdose; spray 1 dose into ONE nostril; alternate nostrils with each dose until help arrives   omeprazole  20 MG capsule Commonly known as: PRILOSEC Take 20 mg by mouth as needed (GERD).   Ozempic  (1 MG/DOSE) 4 MG/3ML Sopn Generic drug: Semaglutide  (1 MG/DOSE) Inject 1 mg into the skin once a week. What changed: how much to take   Symbicort  160-4.5 MCG/ACT inhaler Generic drug: budesonide -formoterol  Inhale 1 puff into the lungs 2 (two) times daily.   triamterene -hydrochlorothiazide  37.5-25 MG capsule Commonly  known as: DYAZIDE  Take 1 capsule by mouth daily.   valsartan  80 MG tablet Commonly known as: DIOVAN  TAKE 1 TABLET(80 MG) BY MOUTH DAILY   Vitamin D  (Ergocalciferol ) 1.25 MG (50000 UNIT) Caps capsule Commonly known as: DRISDOL  Take 1 capsule (50,000 Units total) by mouth once a week.        Allergies  Allergen Reactions   Aspirin  Nausea And Vomiting    GI upset    Consultations: TeleNeuro   Procedures/Studies: CT ANGIO HEAD NECK W WO CM Result Date: 06/29/2024 EXAM: CTA HEAD AND NECK WITH AND WITHOUT 06/29/2024 09:38:23 PM TECHNIQUE: CTA of the head and neck was performed with and without the administration of 75 mL of iohexol  (OMNIPAQUE ) 350 MG/ML injection. Multiplanar 2D and/or 3D reformatted images are provided for review. Automated exposure control, iterative reconstruction, and/or weight based adjustment of the mA/kV was utilized to reduce the radiation dose to as low as reasonably achievable. Stenosis of the internal carotid arteries measured using NASCET criteria. COMPARISON: CT and MRI from earlier the same day. CLINICAL HISTORY: Transient ischemic  attack (TIA); tia. Notes from triage: Pt arrived via stretcher from short stay. States speech is slurred, feeling unbalanced, feeling weak all over. Grip strengths equal, sensation equal. Woke like this. FINDINGS: CTA NECK: AORTIC ARCH AND ARCH VESSELS: No dissection or arterial injury. No significant stenosis of the brachiocephalic or subclavian arteries. CERVICAL CAROTID ARTERIES: No dissection, arterial injury, or hemodynamically significant stenosis by NASCET criteria. CERVICAL VERTEBRAL ARTERIES: No dissection, arterial injury, or significant stenosis. LUNGS AND MEDIASTINUM: 4 mm right upper lobe pulmonary nodule (series 11, image 23). SOFT TISSUES: No acute abnormality. BONES: Bulky osteophytic spurring within the cervical spine, suggestive of DISH. Intermittent changes of OPLL noted. CTA HEAD: ANTERIOR CIRCULATION: Right A1  segment hypoplastic, atretic, or absent. No significant stenosis of the internal carotid arteries. No significant stenosis of the anterior cerebral arteries. No significant stenosis of the middle cerebral arteries. No aneurysm. POSTERIOR CIRCULATION: Fetal type origin of the right PCA. No significant stenosis of the posterior cerebral arteries. No significant stenosis of the basilar artery. No significant stenosis of the vertebral arteries. No aneurysm. OTHER: No dural venous sinus thrombosis on this non-dedicated study. IMPRESSION: 1. Negative CTA for acute large vessel occlusion or other emergent finding. No hemodynamically significant or correctable stenosis. Electronically signed by: Morene Hoard MD 06/29/2024 10:33 PM EDT RP Workstation: [id number]   MR BRAIN WO CONTRAST Result Date: 06/29/2024 EXAM: MRI BRAIN WITHOUT CONTRAST 06/29/2024 02:46:52 PM TECHNIQUE: Multiplanar multisequence MRI of the head/brain was performed without the administration of intravenous contrast. COMPARISON: Same day CT head and MRI head 03/29/2021. CLINICAL HISTORY: Dysarthria, aphasia, states speech is slurred, feeling unbalanced, feeling weak all over. FINDINGS: BRAIN AND VENTRICLES: No acute infarct. No intracranial hemorrhage. No mass. No midline shift. No hydrocephalus. T2/FLAIR hyperintensity in the periventricular and subcortical white matter compatible with mild chronic microvascular ischemic changes. The sella is unremarkable. Normal flow voids. ORBITS: No acute abnormality. SINUSES AND MASTOIDS: Mucosal thickening in the right maxillary sinus. No acute abnormality in the mastoids. BONES AND SOFT TISSUES: Normal marrow signal. No acute soft tissue abnormality. IMPRESSION: 1. No acute intracranial abnormality. 2. Mild chronic microvascular ischemic changes. Electronically signed by: Donnice Mania MD 06/29/2024 04:19 PM EDT RP Workstation: [id number]   CT HEAD WO CONTRAST Result Date: 06/29/2024 EXAM: CT HEAD  WITHOUT CONTRAST 06/29/2024 09:39:00 AM TECHNIQUE: CT of the head was performed without the administration of intravenous contrast. Automated exposure control, iterative reconstruction, and/or weight based adjustment of the mA/kV was utilized to reduce the radiation dose to as low as reasonably achievable. COMPARISON: None available. CLINICAL HISTORY: Neuro deficit, acute, stroke suspected. FINDINGS: BRAIN AND VENTRICLES: No acute hemorrhage. No evidence of acute infarct. No hydrocephalus. No extra-axial collection. No mass effect or midline shift. ORBITS: No acute abnormality. SINUSES: No acute abnormality. SOFT TISSUES AND SKULL: No acute soft tissue abnormality. No skull fracture. IMPRESSION: 1. No evidence of  acute intracranial abnormality. Electronically signed by: Gilmore Molt MD 06/29/2024 09:48 AM EDT RP Workstation: [id number]   CT Angio Chest PE W and/or Wo Contrast Result Date: 06/06/2024 CLINICAL DATA:  Evaluate for pulmonary embolism, high probability EXAM: CT ANGIOGRAPHY CHEST WITH CONTRAST TECHNIQUE: Multidetector CT imaging of the chest was performed using the standard protocol during bolus administration of intravenous contrast. Multiplanar CT image reconstructions and MIPs were obtained to evaluate the vascular anatomy. RADIATION DOSE REDUCTION: This exam was performed according to the departmental dose-optimization program which includes automated exposure control, adjustment of the mA and/or kV according to patient size and/or use of iterative  reconstruction technique. CONTRAST:  75mL OMNIPAQUE  IOHEXOL  350 MG/ML SOLN COMPARISON:  CT chest lung cancer screening April 28, 2024. FINDINGS: Cardiovascular: Satisfactory opacification of the pulmonary arteries to the segmental level. No evidence of pulmonary embolism. Normal heart size. No pericardial effusion. Mediastinum/Nodes: No enlarged mediastinal, hilar, or axillary lymph nodes. Few subcentimeter multi station mediastinal lymph nodes.  Thyroid  gland, trachea, and esophagus demonstrate no significant findings. Lungs/Pleura: Linear atelectasis in left lung base, stable to prior. Subsegmental atelectasis/consolidation of right lower lobe, increased to prior. Moderate bronchial wall thickening. Scattered calcified and noncalcified micro nodules and nodules up to 4 mm for example in right upper lobe, stable to prior (9/67, 42). No pleural effusion. Upper Abdomen: Hepatic steatosis. Bilateral nodular thickening of adrenal glands. Musculoskeletal: No chest wall abnormality. No acute or significant osseous findings. Review of the MIP images confirms the above findings. IMPRESSION: No suspicious findings to suggest pulmonary embolism. Subsegmental atelectasis of bilateral lung base right-greater-than-left, increased to prior. Scattered pulmonary nodules up to 4 mm, stable to prior. Non-contrast chest CT can be considered in 12 months if patient is high-risk. This recommendation follows the consensus statement: Guidelines for Management of Incidental Pulmonary Nodules Detected on CT Images: From the Fleischner Society 2017; Radiology 2017; 284:228-243. Electronically Signed   By: Megan  Zare M.D.   On: 06/06/2024 12:33   DG Chest 2 View Result Date: 06/06/2024 EXAM: 2 VIEW(S) XRAY OF THE CHEST 06/06/2024 09:22:00 AM COMPARISON: 05/01/2024 CLINICAL HISTORY: SOB. Sob,copd FINDINGS: LUNGS AND PLEURA: No focal pulmonary opacity. No pulmonary edema. No pleural effusion. No pneumothorax. HEART AND MEDIASTINUM: No acute abnormality of the cardiac and mediastinal silhouettes. BONES AND SOFT TISSUES: Multilevel degenerative changes of thoracic spine. No acute osseous abnormality. IMPRESSION: 1. No acute cardiopulmonary process. Electronically signed by: Waddell Calk MD 06/06/2024 09:36 AM EDT RP Workstation: [id number]     Subjective: No acute issues/events overnight   Discharge Exam: Vitals:   06/30/24 0935 06/30/24 1317  BP: 125/80 126/81  Pulse: 85  75  Resp: (!) 25 20  Temp: 97.7 F (36.5 C) 97.8 F (36.6 C)  SpO2: 96% 98%   Vitals:   06/30/24 0607 06/30/24 0717 06/30/24 0935 06/30/24 1317  BP: (!) 150/99  125/80 126/81  Pulse: 84  85 75  Resp: 17  (!) 25 20  Temp:   97.7 F (36.5 C) 97.8 F (36.6 C)  TempSrc:   Oral Oral  SpO2: 94% 96% 96% 98%    General: Pt is alert, awake, not in acute distress Cardiovascular: RRR, S1/S2 +, no rubs, no gallops Respiratory: CTA bilaterally, no wheezing, no rhonchi Abdominal: Soft, NT, ND, bowel sounds + Extremities: no edema, no cyanosis    The results of significant diagnostics from this hospitalization (including imaging, microbiology, ancillary and laboratory) are listed below for reference.     Microbiology: No results found for this or any previous visit (from the past 240 hours).   Labs: BNP (last 3 results) No results for input(s): BNP in the last 8760 hours. Basic Metabolic Panel: Recent Labs  Lab 06/29/24 0855 06/29/24 1028 06/30/24 0550 06/30/24 0800  NA 137 141  --  138  K 4.7 5.2*  --  4.1  CL 104 108  --  101  CO2 23  --   --  27  GLUCOSE 102* 92  --  96  BUN 16 19  --  16  CREATININE 0.67 0.60  --  0.60  CALCIUM  10.1  --   --  9.9  MG  --   --  2.1  --    Liver Function Tests: Recent Labs  Lab 06/29/24 0855 06/30/24 0800  AST 14* 14*  ALT 12 19  ALKPHOS 82 89  BILITOT 0.4 0.4  PROT 7.6 7.3  ALBUMIN 4.1 4.0   No results for input(s): LIPASE, AMYLASE in the last 168 hours. No results for input(s): AMMONIA in the last 168 hours. CBC: Recent Labs  Lab 06/29/24 0855 06/29/24 1028  WBC 9.3  --   NEUTROABS 5.4  --   HGB 14.4 14.6  HCT 45.7 43.0  MCV 97.4  --   PLT 216  --    Cardiac Enzymes: No results for input(s): CKTOTAL, CKMB, CKMBINDEX, TROPONINI in the last 168 hours. BNP: Invalid input(s): POCBNP CBG: Recent Labs  Lab 06/29/24 0833 06/29/24 1016 06/29/24 1332 06/30/24 0910 06/30/24 1203  GLUCAP 107* 71  78 83 123*   D-Dimer No results for input(s): DDIMER in the last 72 hours. Hgb A1c No results for input(s): HGBA1C in the last 72 hours. Lipid Profile Recent Labs    06/30/24 0550  CHOL 148  HDL 58  LDLCALC 65  TRIG 124  CHOLHDL 2.5   Thyroid  function studies No results for input(s): TSH, T4TOTAL, T3FREE, THYROIDAB in the last 72 hours.  Invalid input(s): FREET3 Anemia work up No results for input(s): VITAMINB12, FOLATE, FERRITIN, TIBC, IRON, RETICCTPCT in the last 72 hours. Urinalysis    Component Value Date/Time   COLORURINE STRAW (A) 06/29/2024 0907   APPEARANCEUR CLEAR 06/29/2024 0907   LABSPEC 1.006 06/29/2024 0907   PHURINE 6.0 06/29/2024 0907   GLUCOSEU NEGATIVE 06/29/2024 0907   HGBUR NEGATIVE 06/29/2024 0907   BILIRUBINUR NEGATIVE 06/29/2024 0907   BILIRUBINUR neg 06/19/2015 1142   KETONESUR NEGATIVE 06/29/2024 0907   PROTEINUR NEGATIVE 06/29/2024 0907   UROBILINOGEN 0.2 06/19/2015 1142   UROBILINOGEN 0.2 08/24/2013 1416   NITRITE NEGATIVE 06/29/2024 0907   LEUKOCYTESUR NEGATIVE 06/29/2024 0907   Sepsis Labs Recent Labs  Lab 06/29/24 0855  WBC 9.3   Microbiology No results found for this or any previous visit (from the past 240 hours).   Time coordinating discharge: Over 30 minutes  SIGNED:   Elsie JAYSON [place], DO [facility] 06/30/2024, 5:21 PM Pager   If 7PM-7AM, please contact night-coverage [email or web address]

## 2024-07-17 ENCOUNTER — Other Ambulatory Visit (HOSPITAL_COMMUNITY): Payer: Self-pay

## 2024-07-17 MED ORDER — OXYCODONE HCL 10 MG PO TABS
10.0000 mg | ORAL_TABLET | Freq: Every day | ORAL | 0 refills | Status: DC
  Filled 2024-07-17: qty 180, 30d supply, fill #0

## 2024-08-14 ENCOUNTER — Other Ambulatory Visit (HOSPITAL_COMMUNITY): Payer: Self-pay

## 2024-08-14 MED ORDER — OXYCODONE HCL 10 MG PO TABS
10.0000 mg | ORAL_TABLET | Freq: Every day | ORAL | 0 refills | Status: DC
  Filled 2024-08-14: qty 180, 30d supply, fill #0

## 2024-08-14 MED ORDER — METHOCARBAMOL 500 MG PO TABS
500.0000 mg | ORAL_TABLET | Freq: Three times a day (TID) | ORAL | 1 refills | Status: AC | PRN
  Filled 2024-08-14: qty 90, 30d supply, fill #0

## 2024-09-01 NOTE — Congregational Nurse Program (Signed)
 A user error has taken place: encounter opened in error, closed for administrative reasons.

## 2024-09-11 ENCOUNTER — Other Ambulatory Visit: Payer: Self-pay | Admitting: Family Medicine

## 2024-09-11 DIAGNOSIS — E041 Nontoxic single thyroid nodule: Secondary | ICD-10-CM

## 2024-09-13 ENCOUNTER — Other Ambulatory Visit (HOSPITAL_COMMUNITY): Payer: Self-pay

## 2024-09-13 MED ORDER — VITAMIN D (ERGOCALCIFEROL) 1.25 MG (50000 UNIT) PO CAPS
50000.0000 [IU] | ORAL_CAPSULE | ORAL | 2 refills | Status: AC
  Filled 2024-09-13: qty 12, 84d supply, fill #0

## 2024-09-13 MED ORDER — OXYCODONE HCL 10 MG PO TABS
10.0000 mg | ORAL_TABLET | Freq: Every day | ORAL | 0 refills | Status: DC
  Filled 2024-09-13: qty 180, 30d supply, fill #0

## 2024-09-15 ENCOUNTER — Encounter: Payer: Self-pay | Admitting: Family Medicine

## 2024-09-28 ENCOUNTER — Inpatient Hospital Stay: Admission: RE | Admit: 2024-09-28 | Discharge: 2024-09-28 | Attending: Family Medicine | Admitting: Family Medicine

## 2024-09-28 DIAGNOSIS — E041 Nontoxic single thyroid nodule: Secondary | ICD-10-CM

## 2024-10-11 ENCOUNTER — Other Ambulatory Visit (HOSPITAL_COMMUNITY): Payer: Self-pay

## 2024-10-11 MED ORDER — OXYCODONE HCL 10 MG PO TABS
10.0000 mg | ORAL_TABLET | Freq: Every day | ORAL | 0 refills | Status: AC
  Filled 2024-10-11: qty 180, 30d supply, fill #0

## 2024-11-22 ENCOUNTER — Ambulatory Visit: Admitting: Physician Assistant
